# Patient Record
Sex: Female | Born: 1940
Health system: Southern US, Community
[De-identification: ages and names within clinical notes are randomized; demographics above are authoritative.]

## PROBLEM LIST (undated history)

## (undated) ENCOUNTER — Emergency Department (HOSPITAL_BASED_OUTPATIENT_CLINIC_OR_DEPARTMENT_OTHER): Admission: EM | Payer: Medicare HMO | Source: Home / Self Care

## (undated) DIAGNOSIS — G4730 Sleep apnea, unspecified: Secondary | ICD-10-CM

## (undated) DIAGNOSIS — M1990 Unspecified osteoarthritis, unspecified site: Secondary | ICD-10-CM

## (undated) DIAGNOSIS — R7309 Other abnormal glucose: Secondary | ICD-10-CM

## (undated) DIAGNOSIS — M797 Fibromyalgia: Secondary | ICD-10-CM

## (undated) DIAGNOSIS — K648 Other hemorrhoids: Secondary | ICD-10-CM

## (undated) DIAGNOSIS — D126 Benign neoplasm of colon, unspecified: Secondary | ICD-10-CM

## (undated) DIAGNOSIS — M8580 Other specified disorders of bone density and structure, unspecified site: Secondary | ICD-10-CM

## (undated) DIAGNOSIS — F419 Anxiety disorder, unspecified: Secondary | ICD-10-CM

## (undated) DIAGNOSIS — Z8719 Personal history of other diseases of the digestive system: Secondary | ICD-10-CM

## (undated) DIAGNOSIS — Z9289 Personal history of other medical treatment: Secondary | ICD-10-CM

## (undated) DIAGNOSIS — E785 Hyperlipidemia, unspecified: Secondary | ICD-10-CM

## (undated) DIAGNOSIS — G44009 Cluster headache syndrome, unspecified, not intractable: Secondary | ICD-10-CM

## (undated) DIAGNOSIS — F5104 Psychophysiologic insomnia: Secondary | ICD-10-CM

## (undated) DIAGNOSIS — F329 Major depressive disorder, single episode, unspecified: Secondary | ICD-10-CM

## (undated) DIAGNOSIS — I890 Lymphedema, not elsewhere classified: Secondary | ICD-10-CM

## (undated) DIAGNOSIS — J189 Pneumonia, unspecified organism: Secondary | ICD-10-CM

## (undated) DIAGNOSIS — E039 Hypothyroidism, unspecified: Secondary | ICD-10-CM

## (undated) DIAGNOSIS — N393 Stress incontinence (female) (male): Secondary | ICD-10-CM

## (undated) DIAGNOSIS — Z86010 Personal history of colonic polyps: Secondary | ICD-10-CM

## (undated) DIAGNOSIS — E119 Type 2 diabetes mellitus without complications: Secondary | ICD-10-CM

## (undated) DIAGNOSIS — Z923 Personal history of irradiation: Secondary | ICD-10-CM

## (undated) DIAGNOSIS — K589 Irritable bowel syndrome without diarrhea: Secondary | ICD-10-CM

## (undated) DIAGNOSIS — H3530 Unspecified macular degeneration: Secondary | ICD-10-CM

## (undated) DIAGNOSIS — N200 Calculus of kidney: Secondary | ICD-10-CM

## (undated) DIAGNOSIS — K5790 Diverticulosis of intestine, part unspecified, without perforation or abscess without bleeding: Secondary | ICD-10-CM

## (undated) DIAGNOSIS — K219 Gastro-esophageal reflux disease without esophagitis: Secondary | ICD-10-CM

## (undated) DIAGNOSIS — F32A Depression, unspecified: Secondary | ICD-10-CM

## (undated) DIAGNOSIS — R0600 Dyspnea, unspecified: Secondary | ICD-10-CM

## (undated) DIAGNOSIS — M25519 Pain in unspecified shoulder: Secondary | ICD-10-CM

## (undated) DIAGNOSIS — Z87442 Personal history of urinary calculi: Secondary | ICD-10-CM

## (undated) DIAGNOSIS — C50919 Malignant neoplasm of unspecified site of unspecified female breast: Secondary | ICD-10-CM

## (undated) HISTORY — DX: Unspecified macular degeneration: H35.30

## (undated) HISTORY — PX: FOOT ARTHROPLASTY: SHX1657

## (undated) HISTORY — PX: HEMORRHOID SURGERY: SHX153

## (undated) HISTORY — DX: Personal history of colonic polyps: Z86.010

## (undated) HISTORY — PX: TONSILLECTOMY: SHX5217

## (undated) HISTORY — DX: Fibromyalgia: M79.7

## (undated) HISTORY — PX: EYE SURGERY: SHX253

## (undated) HISTORY — PX: TOE SURGERY: SHX1073

## (undated) HISTORY — DX: Benign neoplasm of colon, unspecified: D12.6

## (undated) HISTORY — DX: Diverticulosis of intestine, part unspecified, without perforation or abscess without bleeding: K57.90

## (undated) HISTORY — DX: Cluster headache syndrome, unspecified, not intractable: G44.009

## (undated) HISTORY — DX: Pain in unspecified shoulder: M25.519

## (undated) HISTORY — DX: Stress incontinence (female) (male): N39.3

## (undated) HISTORY — PX: APPENDECTOMY: SHX54

## (undated) HISTORY — DX: Major depressive disorder, single episode, unspecified: F32.9

## (undated) HISTORY — DX: Hypothyroidism, unspecified: E03.9

## (undated) HISTORY — PX: UPPER GASTROINTESTINAL ENDOSCOPY: SHX188

## (undated) HISTORY — PX: OTHER SURGICAL HISTORY: SHX169

## (undated) HISTORY — DX: Psychophysiologic insomnia: F51.04

## (undated) HISTORY — DX: Other hemorrhoids: K64.8

## (undated) HISTORY — DX: Irritable bowel syndrome, unspecified: K58.9

## (undated) HISTORY — PX: GANGLION CYST EXCISION: SHX1691

## (undated) HISTORY — PX: ABDOMINAL HYSTERECTOMY: SHX81

## (undated) HISTORY — DX: Personal history of irradiation: Z92.3

## (undated) HISTORY — PX: KNEE ARTHROSCOPY: SHX127

## (undated) HISTORY — DX: Gastro-esophageal reflux disease without esophagitis: K21.9

## (undated) HISTORY — DX: Other abnormal glucose: R73.09

## (undated) HISTORY — PX: RECTOCELE REPAIR: SHX761

## (undated) HISTORY — PX: COLONOSCOPY: SHX174

## (undated) HISTORY — DX: Hyperlipidemia, unspecified: E78.5

## (undated) HISTORY — PX: CYSTOCELE REPAIR: SHX163

## (undated) HISTORY — PX: BREAST CYST ASPIRATION: SHX578

## (undated) HISTORY — PX: CHOLECYSTECTOMY: SHX55

## (undated) HISTORY — DX: Malignant neoplasm of unspecified site of unspecified female breast: C50.919

## (undated) HISTORY — PX: CATARACT EXTRACTION, BILATERAL: SHX1313

---

## 2006-06-22 ENCOUNTER — Encounter: Payer: Self-pay | Admitting: Internal Medicine

## 2006-07-21 ENCOUNTER — Encounter: Payer: Self-pay | Admitting: Internal Medicine

## 2006-08-25 ENCOUNTER — Encounter: Payer: Self-pay | Admitting: Internal Medicine

## 2006-10-05 ENCOUNTER — Encounter: Payer: Self-pay | Admitting: Internal Medicine

## 2007-01-07 ENCOUNTER — Emergency Department (HOSPITAL_COMMUNITY): Admission: EM | Admit: 2007-01-07 | Discharge: 2007-01-07 | Payer: Self-pay | Admitting: *Deleted

## 2007-01-10 ENCOUNTER — Encounter: Payer: Self-pay | Admitting: Internal Medicine

## 2007-03-21 ENCOUNTER — Encounter: Payer: Self-pay | Admitting: Internal Medicine

## 2007-05-24 ENCOUNTER — Encounter: Payer: Self-pay | Admitting: Internal Medicine

## 2007-05-25 ENCOUNTER — Encounter: Payer: Self-pay | Admitting: Internal Medicine

## 2007-07-05 ENCOUNTER — Encounter: Payer: Self-pay | Admitting: Internal Medicine

## 2007-09-13 ENCOUNTER — Encounter: Payer: Self-pay | Admitting: Internal Medicine

## 2007-09-27 ENCOUNTER — Encounter: Payer: Self-pay | Admitting: Internal Medicine

## 2007-11-16 HISTORY — PX: TOE SURGERY: SHX1073

## 2008-01-17 ENCOUNTER — Encounter: Payer: Self-pay | Admitting: Internal Medicine

## 2008-02-01 ENCOUNTER — Encounter: Payer: Self-pay | Admitting: Internal Medicine

## 2008-03-15 ENCOUNTER — Encounter: Payer: Self-pay | Admitting: Internal Medicine

## 2008-03-18 ENCOUNTER — Ambulatory Visit: Payer: Self-pay | Admitting: Internal Medicine

## 2008-03-18 DIAGNOSIS — IMO0001 Reserved for inherently not codable concepts without codable children: Secondary | ICD-10-CM | POA: Insufficient documentation

## 2008-03-18 DIAGNOSIS — E785 Hyperlipidemia, unspecified: Secondary | ICD-10-CM | POA: Insufficient documentation

## 2008-03-18 DIAGNOSIS — R7303 Prediabetes: Secondary | ICD-10-CM | POA: Insufficient documentation

## 2008-03-18 DIAGNOSIS — G4700 Insomnia, unspecified: Secondary | ICD-10-CM | POA: Insufficient documentation

## 2008-03-18 DIAGNOSIS — E039 Hypothyroidism, unspecified: Secondary | ICD-10-CM | POA: Insufficient documentation

## 2008-03-18 LAB — CONVERTED CEMR LAB
Albumin: 3.8 g/dL (ref 3.5–5.2)
Basophils Relative: 0.2 % (ref 0.0–1.0)
Bilirubin, Direct: 0.1 mg/dL (ref 0.0–0.3)
Calcium: 9.1 mg/dL (ref 8.4–10.5)
Chloride: 102 meq/L (ref 96–112)
Cholesterol: 231 mg/dL (ref 0–200)
Direct LDL: 176.1 mg/dL
Eosinophils Relative: 2.2 % (ref 0.0–5.0)
GFR calc non Af Amer: 90 mL/min
Glucose, Bld: 114 mg/dL — ABNORMAL HIGH (ref 70–99)
HCT: 40.8 % (ref 36.0–46.0)
HDL: 34.7 mg/dL — ABNORMAL LOW (ref >39.0)
Hgb A1c MFr Bld: 6.1 % — ABNORMAL HIGH (ref 4.6–6.0)
Lymphocytes Relative: 26.8 % (ref 12.0–46.0)
Monocytes Absolute: 0.5 10*3/uL (ref 0.1–1.0)
Neutro Abs: 6.3 10*3/uL (ref 1.4–7.7)
Potassium: 4.6 meq/L (ref 3.5–5.1)
RBC: 4.67 M/uL (ref 3.87–5.11)
RDW: 13.4 % (ref 11.5–14.6)
Sodium: 145 meq/L (ref 135–145)
Total CHOL/HDL Ratio: 6.7
Triglycerides: 122 mg/dL (ref 0–149)
VLDL: 24 mg/dL (ref 0–40)
WBC: 9.5 10*3/uL (ref 4.5–10.5)

## 2008-03-19 DIAGNOSIS — Z860100 Personal history of colon polyps, unspecified: Secondary | ICD-10-CM | POA: Insufficient documentation

## 2008-03-19 DIAGNOSIS — Z87442 Personal history of urinary calculi: Secondary | ICD-10-CM | POA: Insufficient documentation

## 2008-03-19 DIAGNOSIS — J4520 Mild intermittent asthma, uncomplicated: Secondary | ICD-10-CM | POA: Insufficient documentation

## 2008-03-19 DIAGNOSIS — K219 Gastro-esophageal reflux disease without esophagitis: Secondary | ICD-10-CM | POA: Insufficient documentation

## 2008-04-15 ENCOUNTER — Ambulatory Visit: Payer: Self-pay | Admitting: Internal Medicine

## 2008-04-15 DIAGNOSIS — K589 Irritable bowel syndrome without diarrhea: Secondary | ICD-10-CM | POA: Insufficient documentation

## 2008-04-22 ENCOUNTER — Telehealth: Payer: Self-pay | Admitting: Internal Medicine

## 2008-04-22 DIAGNOSIS — M79609 Pain in unspecified limb: Secondary | ICD-10-CM | POA: Insufficient documentation

## 2008-05-01 ENCOUNTER — Ambulatory Visit: Payer: Self-pay | Admitting: Internal Medicine

## 2008-05-01 ENCOUNTER — Telehealth: Payer: Self-pay | Admitting: Internal Medicine

## 2008-05-05 ENCOUNTER — Telehealth: Payer: Self-pay | Admitting: Internal Medicine

## 2008-05-16 ENCOUNTER — Telehealth (INDEPENDENT_AMBULATORY_CARE_PROVIDER_SITE_OTHER): Payer: Self-pay | Admitting: *Deleted

## 2008-05-20 ENCOUNTER — Ambulatory Visit: Payer: Self-pay | Admitting: Internal Medicine

## 2008-05-20 DIAGNOSIS — H21309 Idiopathic cysts of iris, ciliary body or anterior chamber, unspecified eye: Secondary | ICD-10-CM | POA: Insufficient documentation

## 2008-05-22 ENCOUNTER — Encounter: Payer: Self-pay | Admitting: Internal Medicine

## 2008-05-22 ENCOUNTER — Encounter: Admission: RE | Admit: 2008-05-22 | Discharge: 2008-05-22 | Payer: Self-pay | Admitting: Internal Medicine

## 2008-06-06 ENCOUNTER — Ambulatory Visit: Payer: Self-pay | Admitting: Internal Medicine

## 2008-06-06 DIAGNOSIS — L259 Unspecified contact dermatitis, unspecified cause: Secondary | ICD-10-CM | POA: Insufficient documentation

## 2008-06-13 ENCOUNTER — Ambulatory Visit: Payer: Self-pay | Admitting: Internal Medicine

## 2008-06-13 LAB — CONVERTED CEMR LAB
ALT: 31 units/L (ref 0–35)
AST: 22 units/L (ref 0–37)
LDL Cholesterol: 86 mg/dL (ref 0–99)
Tissue Transglutaminase Ab, IgA: 0.1 units (ref <7)
Total CHOL/HDL Ratio: 5.2

## 2008-06-18 ENCOUNTER — Ambulatory Visit: Payer: Self-pay | Admitting: Internal Medicine

## 2008-06-18 DIAGNOSIS — L0233 Carbuncle of buttock: Secondary | ICD-10-CM | POA: Insufficient documentation

## 2008-06-18 DIAGNOSIS — Z8711 Personal history of peptic ulcer disease: Secondary | ICD-10-CM | POA: Insufficient documentation

## 2008-06-19 ENCOUNTER — Encounter: Payer: Self-pay | Admitting: Internal Medicine

## 2008-06-24 ENCOUNTER — Telehealth: Payer: Self-pay | Admitting: Internal Medicine

## 2008-07-30 ENCOUNTER — Telehealth: Payer: Self-pay | Admitting: Internal Medicine

## 2008-08-08 ENCOUNTER — Ambulatory Visit: Payer: Self-pay | Admitting: Internal Medicine

## 2008-08-08 ENCOUNTER — Ambulatory Visit (HOSPITAL_BASED_OUTPATIENT_CLINIC_OR_DEPARTMENT_OTHER): Admission: RE | Admit: 2008-08-08 | Discharge: 2008-08-08 | Payer: Self-pay | Admitting: Internal Medicine

## 2008-08-08 DIAGNOSIS — R109 Unspecified abdominal pain: Secondary | ICD-10-CM | POA: Insufficient documentation

## 2008-08-08 LAB — CONVERTED CEMR LAB
BUN: 11 mg/dL (ref 6–23)
CA 125: 8.9 units/mL (ref 0.0–30.2)
CO2: 24 meq/L (ref 19–32)
Chloride: 110 meq/L (ref 96–112)
GFR calc non Af Amer: 77 mL/min
Hgb A1c MFr Bld: 6.1 % — ABNORMAL HIGH (ref 4.6–6.0)
Potassium: 4.2 meq/L (ref 3.5–5.1)
Sodium: 143 meq/L (ref 135–145)

## 2008-08-13 ENCOUNTER — Ambulatory Visit: Payer: Self-pay | Admitting: Internal Medicine

## 2008-08-13 ENCOUNTER — Ambulatory Visit (HOSPITAL_BASED_OUTPATIENT_CLINIC_OR_DEPARTMENT_OTHER): Admission: RE | Admit: 2008-08-13 | Discharge: 2008-08-13 | Payer: Self-pay | Admitting: Internal Medicine

## 2008-08-13 DIAGNOSIS — J159 Unspecified bacterial pneumonia: Secondary | ICD-10-CM | POA: Insufficient documentation

## 2008-08-16 ENCOUNTER — Ambulatory Visit: Payer: Self-pay | Admitting: Internal Medicine

## 2008-10-03 ENCOUNTER — Telehealth: Payer: Self-pay | Admitting: Internal Medicine

## 2008-10-04 ENCOUNTER — Ambulatory Visit: Payer: Self-pay | Admitting: Internal Medicine

## 2008-10-04 DIAGNOSIS — R05 Cough: Secondary | ICD-10-CM

## 2008-10-04 DIAGNOSIS — L909 Atrophic disorder of skin, unspecified: Secondary | ICD-10-CM | POA: Insufficient documentation

## 2008-10-04 DIAGNOSIS — R059 Cough, unspecified: Secondary | ICD-10-CM | POA: Insufficient documentation

## 2008-10-04 DIAGNOSIS — L919 Hypertrophic disorder of the skin, unspecified: Secondary | ICD-10-CM

## 2008-10-16 ENCOUNTER — Encounter: Payer: Self-pay | Admitting: Internal Medicine

## 2008-10-16 ENCOUNTER — Ambulatory Visit: Payer: Self-pay | Admitting: Internal Medicine

## 2008-10-21 ENCOUNTER — Encounter: Payer: Self-pay | Admitting: Internal Medicine

## 2008-10-21 ENCOUNTER — Ambulatory Visit: Payer: Self-pay | Admitting: Internal Medicine

## 2008-10-21 DIAGNOSIS — I781 Nevus, non-neoplastic: Secondary | ICD-10-CM | POA: Insufficient documentation

## 2008-10-21 DIAGNOSIS — J029 Acute pharyngitis, unspecified: Secondary | ICD-10-CM | POA: Insufficient documentation

## 2008-11-19 ENCOUNTER — Ambulatory Visit: Payer: Self-pay | Admitting: Internal Medicine

## 2008-11-19 LAB — CONVERTED CEMR LAB
ALT: 28 units/L (ref 0–35)
BUN: 16 mg/dL (ref 6–23)
GFR calc Af Amer: 108 mL/min
Sodium: 140 meq/L (ref 135–145)
TSH: 2.16 microintl units/mL (ref 0.35–5.50)

## 2008-11-21 ENCOUNTER — Telehealth (INDEPENDENT_AMBULATORY_CARE_PROVIDER_SITE_OTHER): Payer: Self-pay | Admitting: *Deleted

## 2008-11-21 ENCOUNTER — Ambulatory Visit (HOSPITAL_BASED_OUTPATIENT_CLINIC_OR_DEPARTMENT_OTHER): Admission: RE | Admit: 2008-11-21 | Discharge: 2008-11-21 | Payer: Self-pay | Admitting: Internal Medicine

## 2008-11-21 ENCOUNTER — Ambulatory Visit: Payer: Self-pay | Admitting: Interventional Radiology

## 2008-11-21 ENCOUNTER — Ambulatory Visit: Payer: Self-pay | Admitting: Internal Medicine

## 2008-11-21 DIAGNOSIS — M25519 Pain in unspecified shoulder: Secondary | ICD-10-CM | POA: Insufficient documentation

## 2008-11-21 DIAGNOSIS — H1030 Unspecified acute conjunctivitis, unspecified eye: Secondary | ICD-10-CM | POA: Insufficient documentation

## 2008-11-22 ENCOUNTER — Telehealth (INDEPENDENT_AMBULATORY_CARE_PROVIDER_SITE_OTHER): Payer: Self-pay | Admitting: *Deleted

## 2008-11-25 ENCOUNTER — Encounter: Payer: Self-pay | Admitting: Internal Medicine

## 2008-11-25 ENCOUNTER — Telehealth: Payer: Self-pay | Admitting: Internal Medicine

## 2008-11-27 ENCOUNTER — Encounter: Payer: Self-pay | Admitting: Internal Medicine

## 2009-01-08 ENCOUNTER — Telehealth: Payer: Self-pay | Admitting: Internal Medicine

## 2009-01-16 ENCOUNTER — Ambulatory Visit: Payer: Self-pay | Admitting: Internal Medicine

## 2009-01-16 DIAGNOSIS — L538 Other specified erythematous conditions: Secondary | ICD-10-CM | POA: Insufficient documentation

## 2009-01-21 ENCOUNTER — Encounter: Payer: Self-pay | Admitting: Emergency Medicine

## 2009-01-21 ENCOUNTER — Ambulatory Visit: Payer: Self-pay | Admitting: Internal Medicine

## 2009-01-21 ENCOUNTER — Ambulatory Visit: Payer: Self-pay | Admitting: Diagnostic Radiology

## 2009-01-21 ENCOUNTER — Observation Stay (HOSPITAL_COMMUNITY): Admission: EM | Admit: 2009-01-21 | Discharge: 2009-01-22 | Payer: Self-pay | Admitting: Internal Medicine

## 2009-01-23 ENCOUNTER — Telehealth: Payer: Self-pay | Admitting: Internal Medicine

## 2009-01-27 ENCOUNTER — Ambulatory Visit: Payer: Self-pay | Admitting: Internal Medicine

## 2009-01-27 DIAGNOSIS — R0789 Other chest pain: Secondary | ICD-10-CM | POA: Insufficient documentation

## 2009-02-06 ENCOUNTER — Ambulatory Visit: Payer: Self-pay | Admitting: Internal Medicine

## 2009-02-06 ENCOUNTER — Ambulatory Visit (HOSPITAL_BASED_OUTPATIENT_CLINIC_OR_DEPARTMENT_OTHER): Admission: RE | Admit: 2009-02-06 | Discharge: 2009-02-06 | Payer: Self-pay | Admitting: Internal Medicine

## 2009-02-06 ENCOUNTER — Telehealth: Payer: Self-pay | Admitting: Internal Medicine

## 2009-02-06 ENCOUNTER — Telehealth: Payer: Self-pay | Admitting: Family Medicine

## 2009-02-06 ENCOUNTER — Ambulatory Visit: Payer: Self-pay | Admitting: Diagnostic Radiology

## 2009-02-06 DIAGNOSIS — M25539 Pain in unspecified wrist: Secondary | ICD-10-CM | POA: Insufficient documentation

## 2009-02-19 ENCOUNTER — Ambulatory Visit: Payer: Self-pay | Admitting: Cardiology

## 2009-02-19 ENCOUNTER — Encounter: Payer: Self-pay | Admitting: Cardiology

## 2009-02-19 DIAGNOSIS — R0602 Shortness of breath: Secondary | ICD-10-CM | POA: Insufficient documentation

## 2009-02-25 ENCOUNTER — Telehealth (INDEPENDENT_AMBULATORY_CARE_PROVIDER_SITE_OTHER): Payer: Self-pay

## 2009-02-26 ENCOUNTER — Encounter: Payer: Self-pay | Admitting: Cardiology

## 2009-02-26 ENCOUNTER — Ambulatory Visit: Payer: Self-pay

## 2009-04-03 ENCOUNTER — Ambulatory Visit: Payer: Self-pay | Admitting: Internal Medicine

## 2009-04-03 DIAGNOSIS — I10 Essential (primary) hypertension: Secondary | ICD-10-CM | POA: Insufficient documentation

## 2009-04-03 LAB — CONVERTED CEMR LAB
GFR calc non Af Amer: 88.99 mL/min (ref >60)
Hgb A1c MFr Bld: 5.9 % (ref 4.6–6.5)
Potassium: 4 meq/L (ref 3.5–5.1)
Sodium: 140 meq/L (ref 135–145)

## 2009-04-07 ENCOUNTER — Ambulatory Visit: Payer: Self-pay | Admitting: Internal Medicine

## 2009-04-22 ENCOUNTER — Ambulatory Visit: Payer: Self-pay | Admitting: Internal Medicine

## 2009-04-22 DIAGNOSIS — J018 Other acute sinusitis: Secondary | ICD-10-CM | POA: Insufficient documentation

## 2009-04-23 ENCOUNTER — Telehealth: Payer: Self-pay | Admitting: Internal Medicine

## 2009-06-16 ENCOUNTER — Ambulatory Visit: Payer: Self-pay | Admitting: Diagnostic Radiology

## 2009-06-16 ENCOUNTER — Ambulatory Visit (HOSPITAL_BASED_OUTPATIENT_CLINIC_OR_DEPARTMENT_OTHER): Admission: RE | Admit: 2009-06-16 | Discharge: 2009-06-16 | Payer: Self-pay | Admitting: Internal Medicine

## 2009-06-16 ENCOUNTER — Telehealth: Payer: Self-pay | Admitting: Internal Medicine

## 2009-06-16 DIAGNOSIS — L680 Hirsutism: Secondary | ICD-10-CM | POA: Insufficient documentation

## 2009-07-22 ENCOUNTER — Ambulatory Visit: Payer: Self-pay | Admitting: Internal Medicine

## 2009-07-22 LAB — CONVERTED CEMR LAB
AST: 15 units/L (ref 0–37)
Alkaline Phosphatase: 65 units/L (ref 39–117)
BUN: 23 mg/dL (ref 6–23)
Basophils Absolute: 0 10*3/uL (ref 0.0–0.1)
Basophils Relative: 0 % (ref 0–1)
Bilirubin, Direct: 0.1 mg/dL (ref 0.0–0.3)
Creatinine, Ser: 0.65 mg/dL (ref 0.40–1.20)
Eosinophils Absolute: 0.3 10*3/uL (ref 0.0–0.7)
Eosinophils Relative: 3 % (ref 0–5)
Hemoglobin: 14 g/dL (ref 12.0–15.0)
Indirect Bilirubin: 0.4 mg/dL (ref 0.0–0.9)
Lymphs Abs: 2.6 10*3/uL (ref 0.7–4.0)
MCHC: 32.4 g/dL (ref 30.0–36.0)
Monocytes Absolute: 0.5 10*3/uL (ref 0.1–1.0)
Monocytes Relative: 6 % (ref 3–12)
Neutro Abs: 5.7 10*3/uL (ref 1.7–7.7)
Platelets: 252 10*3/uL (ref 150–400)
Prolactin: 3.9 ng/mL
Sodium: 139 meq/L (ref 135–145)
Testosterone: 78.04 ng/dL — ABNORMAL HIGH (ref 10–70)
Total CHOL/HDL Ratio: 4
VLDL: 23 mg/dL (ref 0–40)
WBC: 9.1 10*3/uL (ref 4.0–10.5)

## 2009-07-28 ENCOUNTER — Ambulatory Visit: Payer: Self-pay | Admitting: Internal Medicine

## 2009-07-31 ENCOUNTER — Encounter: Payer: Self-pay | Admitting: Internal Medicine

## 2009-08-15 ENCOUNTER — Ambulatory Visit: Payer: Self-pay | Admitting: Diagnostic Radiology

## 2009-08-15 ENCOUNTER — Emergency Department (HOSPITAL_BASED_OUTPATIENT_CLINIC_OR_DEPARTMENT_OTHER): Admission: EM | Admit: 2009-08-15 | Discharge: 2009-08-15 | Payer: Self-pay | Admitting: Emergency Medicine

## 2009-08-18 ENCOUNTER — Ambulatory Visit: Payer: Self-pay | Admitting: Internal Medicine

## 2009-09-26 ENCOUNTER — Telehealth: Payer: Self-pay | Admitting: Internal Medicine

## 2009-10-01 ENCOUNTER — Telehealth: Payer: Self-pay | Admitting: Internal Medicine

## 2009-10-13 ENCOUNTER — Ambulatory Visit: Payer: Self-pay | Admitting: Hematology & Oncology

## 2009-10-14 ENCOUNTER — Telehealth: Payer: Self-pay | Admitting: Internal Medicine

## 2009-11-06 ENCOUNTER — Encounter: Payer: Self-pay | Admitting: Internal Medicine

## 2009-11-15 DIAGNOSIS — K760 Fatty (change of) liver, not elsewhere classified: Secondary | ICD-10-CM

## 2009-11-15 HISTORY — DX: Fatty (change of) liver, not elsewhere classified: K76.0

## 2009-12-01 ENCOUNTER — Encounter (INDEPENDENT_AMBULATORY_CARE_PROVIDER_SITE_OTHER): Payer: Self-pay | Admitting: *Deleted

## 2009-12-03 ENCOUNTER — Ambulatory Visit: Payer: Self-pay | Admitting: Gastroenterology

## 2009-12-03 ENCOUNTER — Telehealth: Payer: Self-pay | Admitting: Gastroenterology

## 2009-12-03 ENCOUNTER — Encounter (INDEPENDENT_AMBULATORY_CARE_PROVIDER_SITE_OTHER): Payer: Self-pay | Admitting: *Deleted

## 2009-12-16 ENCOUNTER — Ambulatory Visit: Payer: Self-pay | Admitting: Gastroenterology

## 2009-12-17 ENCOUNTER — Encounter: Payer: Self-pay | Admitting: Gastroenterology

## 2009-12-18 ENCOUNTER — Encounter: Payer: Self-pay | Admitting: Gastroenterology

## 2010-02-12 ENCOUNTER — Telehealth: Payer: Self-pay | Admitting: Internal Medicine

## 2010-02-16 ENCOUNTER — Encounter: Payer: Self-pay | Admitting: Internal Medicine

## 2010-02-16 LAB — CONVERTED CEMR LAB
BUN: 17 mg/dL (ref 6–23)
Bilirubin, Direct: 0.1 mg/dL (ref 0.0–0.3)
Chloride: 103 meq/L (ref 96–112)
Glucose, Bld: 91 mg/dL (ref 70–99)
HDL: 38 mg/dL — ABNORMAL LOW (ref >39)
Indirect Bilirubin: 0.7 mg/dL (ref 0.0–0.9)
LDL Cholesterol: 100 mg/dL — ABNORMAL HIGH (ref 0–99)
Potassium: 4.3 meq/L (ref 3.5–5.3)
Sodium: 140 meq/L (ref 135–145)
TSH: 1.789 microintl units/mL (ref 0.350–4.500)
Total Bilirubin: 0.8 mg/dL (ref 0.3–1.2)
Total CHOL/HDL Ratio: 4.5
Total Protein: 6.5 g/dL (ref 6.0–8.3)
VLDL: 32 mg/dL (ref 0–40)

## 2010-02-17 ENCOUNTER — Encounter: Payer: Self-pay | Admitting: Internal Medicine

## 2010-02-17 LAB — CONVERTED CEMR LAB: Hgb A1c MFr Bld: 6 % (ref 4.6–6.1)

## 2010-02-26 ENCOUNTER — Ambulatory Visit: Payer: Self-pay | Admitting: Internal Medicine

## 2010-02-27 ENCOUNTER — Telehealth: Payer: Self-pay | Admitting: Internal Medicine

## 2010-03-11 ENCOUNTER — Telehealth: Payer: Self-pay | Admitting: Internal Medicine

## 2010-03-17 ENCOUNTER — Encounter: Payer: Self-pay | Admitting: Internal Medicine

## 2010-03-20 ENCOUNTER — Encounter: Payer: Self-pay | Admitting: Internal Medicine

## 2010-03-20 ENCOUNTER — Encounter: Admission: RE | Admit: 2010-03-20 | Discharge: 2010-03-20 | Payer: Self-pay | Admitting: Surgery

## 2010-03-23 ENCOUNTER — Telehealth: Payer: Self-pay | Admitting: Internal Medicine

## 2010-06-03 ENCOUNTER — Encounter: Payer: Self-pay | Admitting: Internal Medicine

## 2010-06-17 ENCOUNTER — Ambulatory Visit: Payer: Self-pay | Admitting: Diagnostic Radiology

## 2010-06-17 ENCOUNTER — Ambulatory Visit (HOSPITAL_BASED_OUTPATIENT_CLINIC_OR_DEPARTMENT_OTHER): Admission: RE | Admit: 2010-06-17 | Discharge: 2010-06-17 | Payer: Self-pay | Admitting: Internal Medicine

## 2010-06-29 ENCOUNTER — Telehealth: Payer: Self-pay | Admitting: Internal Medicine

## 2010-08-17 ENCOUNTER — Telehealth: Payer: Self-pay | Admitting: Internal Medicine

## 2010-08-17 ENCOUNTER — Encounter: Payer: Self-pay | Admitting: Internal Medicine

## 2010-08-17 DIAGNOSIS — H9190 Unspecified hearing loss, unspecified ear: Secondary | ICD-10-CM | POA: Insufficient documentation

## 2010-08-17 LAB — CONVERTED CEMR LAB
Albumin: 4.5 g/dL (ref 3.5–5.2)
Alkaline Phosphatase: 66 units/L (ref 39–117)
BUN: 18 mg/dL (ref 6–23)
Bilirubin, Direct: 0.1 mg/dL (ref 0.0–0.3)
Chloride: 106 meq/L (ref 96–112)
Cholesterol: 156 mg/dL (ref 0–200)
Creatinine, Ser: 0.71 mg/dL (ref 0.40–1.20)
Indirect Bilirubin: 0.4 mg/dL (ref 0.0–0.9)
LDL Cholesterol: 88 mg/dL (ref 0–99)
Total CHOL/HDL Ratio: 4.3
Total Protein: 6.1 g/dL (ref 6.0–8.3)

## 2010-08-18 ENCOUNTER — Encounter: Payer: Self-pay | Admitting: Internal Medicine

## 2010-08-18 ENCOUNTER — Ambulatory Visit: Payer: Self-pay | Admitting: Internal Medicine

## 2010-08-25 ENCOUNTER — Ambulatory Visit: Payer: Self-pay | Admitting: Internal Medicine

## 2010-08-31 ENCOUNTER — Telehealth: Payer: Self-pay | Admitting: Internal Medicine

## 2010-08-31 DIAGNOSIS — M25569 Pain in unspecified knee: Secondary | ICD-10-CM | POA: Insufficient documentation

## 2010-09-01 ENCOUNTER — Telehealth: Payer: Self-pay | Admitting: Internal Medicine

## 2010-09-02 ENCOUNTER — Encounter: Payer: Self-pay | Admitting: Internal Medicine

## 2010-09-02 ENCOUNTER — Telehealth: Payer: Self-pay | Admitting: Internal Medicine

## 2010-09-21 ENCOUNTER — Encounter: Payer: Self-pay | Admitting: Internal Medicine

## 2010-10-19 ENCOUNTER — Telehealth: Payer: Self-pay | Admitting: Internal Medicine

## 2010-11-17 ENCOUNTER — Telehealth: Payer: Self-pay | Admitting: Internal Medicine

## 2010-11-18 ENCOUNTER — Telehealth: Payer: Self-pay | Admitting: Internal Medicine

## 2010-12-10 ENCOUNTER — Telehealth: Payer: Self-pay | Admitting: Internal Medicine

## 2010-12-15 ENCOUNTER — Encounter: Payer: Self-pay | Admitting: Internal Medicine

## 2010-12-15 LAB — CONVERTED CEMR LAB
CO2: 26 meq/L (ref 19–32)
Chloride: 100 meq/L (ref 96–112)
Creatinine, Ser: 0.62 mg/dL (ref 0.40–1.20)
Potassium: 4.4 meq/L (ref 3.5–5.3)

## 2010-12-15 NOTE — Miscellaneous (Signed)
Summary: [facility]   Imported By: Lanelle Bal 06/12/2010 13:22:37  _____________________________________________________________________  External Attachment:    Type:   Image     Comment:   External Document

## 2010-12-15 NOTE — Medication Information (Signed)
Summary: Denial for Lamisil/[facility]  Denial for Lamisil/[facility]   Imported By: Lanelle Bal 03/25/2010 09:26:05  _____________________________________________________________________  External Attachment:    Type:   Image     Comment:   External Document

## 2010-12-15 NOTE — Letter (Signed)
Summary: Patient Notice- Polyp Results  [facility]  [address] [place], [place] [address]   Phone: [phone]  Fax: [phone]        December 17, 2009 MRN: [id number]    Jean Nash [address] [place], [place]  [address]    Dear Ms. Loudon,  I am pleased to inform you that the colon polyp(s) removed during your recent colonoscopy was (were) found to be benign (no cancer detected) upon pathologic examination.  I recommend you have a repeat colonoscopy examination in 2 years to look for recurrent polyps, as having colon polyps increases your risk for having recurrent polyps or even colon cancer in the future.  Should you develop new or worsening symptoms of abdominal pain, bowel habit changes or bleeding from the rectum or bowels, please schedule an evaluation with either your primary care physician or with me.  Continue treatment plan as outlined the day of your exam.  Please call us if you are having persistent problems or have questions about your condition that have not been fully answered at this time.  Sincerely,  Meryl Dare MD [facility]  This letter has been electronically signed by your physician.  Appended Document: Patient Notice- Polyp Results letter mailed 2.4.11

## 2010-12-15 NOTE — Progress Notes (Signed)
Summary: Pt would like MRI today if possible  Phone Note Call from Patient   Caller: Spouse Reason for Call: Talk to Nurse Summary of Call: pt got an ortho appt tomorrow morning, leg is more swollen  this morning, pt would like to get an MRI today, ortho doctor's office is requesting Dr Artist Pais to order MRI today for tomorrow's appt per pt's husband Initial call taken by: Lannette Donath,  September 01, 2010 9:31 AM  Follow-up for Phone Call        call placed to Dr Darol Destine office at [phone], for clarification of MRI request. They advised patient should be seen prior to having MRI done.  Follow-up by: Glendell Docker CMA,  September 01, 2010 11:53 AM  Additional Follow-up for Phone Call Additional follow up Details #1::        call returned to patient at [phone], no answer. A detailed voice message was left informing patient Dr Artist Pais advises  seeing Ortho before the MRI is done.  Message was left for patient to call if any questions Additional Follow-up by: Glendell Docker CMA,  September 02, 2010 8:12 AM

## 2010-12-15 NOTE — Letter (Signed)
Summary: [facility]  [facility]   Imported By: Maryln Gottron 10/13/2010 13:42:52  _____________________________________________________________________  External Attachment:    Type:   Image     Comment:   External Document

## 2010-12-15 NOTE — Progress Notes (Signed)
Summary: Medication Refills  Phone Note Refill Request Call back at Home Phone [phone] Message from:  Patient on June 29, 2010 1:54 PM  Refills Requested: Medication #1:  LYRICA 75 MG  CAPS Take 1 tab by mouth at bedtime   Dosage confirmed as above?Dosage Confirmed   Brand Name Necessary? No   Supply Requested: 3 months  Medication #2:  SIMVASTATIN 20 MG TABS Take 1 tab by mouth at bedtime   Dosage confirmed as above?Dosage Confirmed   Brand Name Necessary? No   Supply Requested: 3 months patients husband [name] called and left voice message requesting above refills to [facility].   Method Requested: Electronic Next Appointment Scheduled: 08/25/2010 @ 10am Initial call taken by: Glendell Docker CMA,  June 29, 2010 1:55 PM  Follow-up for Phone Call        ok to refill x 5 Follow-up by: D. Thomos Lemons DO,  June 29, 2010 3:53 PM  Additional Follow-up for Phone Call Additional follow up Details #1::        Rx faxed to pharmacy Additional Follow-up by: Glendell Docker CMA,  June 29, 2010 5:12 PM    Prescriptions: LYRICA 75 MG  CAPS (PREGABALIN) Take 1 tab by mouth at bedtime  #90 x 2   Entered by:   Glendell Docker CMA   Authorized by:   D. Thomos Lemons DO   Signed by:   Glendell Docker CMA on 06/29/2010   Method used:   Printed then faxed to ...       [facility]* (mail-order)       [address]       [place], [place]  [address]       Ph: [phone]       Fax: [phone]   RxID:   [id number] SIMVASTATIN 20 MG TABS (SIMVASTATIN) Take 1 tab by mouth at bedtime  #90 x 2   Entered by:   Glendell Docker CMA   Authorized by:   D. Thomos Lemons DO   Signed by:   Glendell Docker CMA on 06/29/2010   Method used:   Electronically to        [facility]* (mail-order)       [address]       [place], [place]  [address]       Ph: [phone]       Fax: [phone]   RxID:   [id number]

## 2010-12-15 NOTE — Consult Note (Signed)
Summary: [facility]  [facility]   Imported By: Lanelle Bal 09/21/2010 12:43:59  _____________________________________________________________________  External Attachment:    Type:   Image     Comment:   External Document

## 2010-12-15 NOTE — Progress Notes (Signed)
Summary: ?need for EGD  ---- Converted from flag ---- ---- 12/03/2009 12:17 PM, Meryl Dare MD [facility] wrote: Her pathology report showed the "gastric polyp" to be reactive gastropathy. It had no precancerous tissue and does not need follow up. No need for EGD at this time.  Keep the colonoscopy as scheduled for V12.72.    MS ------------------------------

## 2010-12-15 NOTE — Assessment & Plan Note (Signed)
Summary: 6 MONTH FOLLOW UP/[facility]   Vital Signs:  Patient profile:   70 year old female Height:      60 inches Weight:      195.50 pounds BMI:     38.32 O2 Sat:      97 % on Room air Temp:     97.2 degrees F oral Pulse rate:   61 / minute Pulse rhythm:   regular Resp:     16 per minute BP sitting:   110 / 72  (right arm) Cuff size:   large  Vitals Entered By: Glendell Docker CMA (February 26, 2010 10:58 AM)  O2 Flow:  Room air CC: Rm 3- 6 Month follow up disease management Comments skin tags, left hand pull, right eyebrow pain to touch- may be related to sinuses, evaluation of dry patches on elbow and scalp   Primary Care Provider:  DThomos Lemons DO  CC:  Rm 3- 6 Month follow up disease management.  History of Present Illness: 70 y/o white female c/o intermittent left groin pain occ sharp worse with getting in and out of car no obvious inguinal protrusion denies hip pain  she c/o scalp fungal dermatitis came back itchy  she also reports mild scaly, silvery patch elbows  Allergies (verified): No Known Drug Allergies  Past History:  Past Medical History: Current Problems:  CHEST PAIN, ATYPICAL (ICD-786.59) HYPERLIPIDEMIA (ICD-272.4)  NEPHROLITHIASIS, HX OF (ICD-V13.01)  INTERTRIGO, CANDIDAL (ICD-695.89) UNSPECIFIED ACUTE CONJUNCTIVITIS (ICD-372.00)  SHOULDER PAIN, LEFT (ICD-719.41)  DYSPLASTIC NEVUS, BACK (ICD-448.1) SKIN TAG (ICD-701.9) BACTERIAL PNEUMONIA (ICD-482.9) PELVIC PAIN, RIGHT (ICD-789.09) CARBUNCLE AND FURUNCLE OF BUTTOCK (ICD-680.5) HELICOBACTER PYLORI INFECTION, HX OF (ICD-V12.71) DERMATITIS (ICD-692.9) CYST, IDIOPATHIC (ICD-364.60) IRRITABLE BOWEL SYNDROME (ICD-564.1) INSOMNIA, CHRONIC (ICD-307.42) GERD (ICD-530.81) COLONIC POLYPS, HX OF (ICD-V12.72) ASTHMA (ICD-493.90) FIBROMYALGIA (ICD-729.1) HYPOTHYROIDISM (ICD-244.9)  DIABETES MELLITUS, TYPE II, BORDERLINE (ICD-790.29) History of migraines/cluster headaches Stress incontinence Macular  degeneration History of H. Pylori gastritis   Hx of pneumonia   Past Surgical History: Appendectomy Cholecystectomy  Hysterectomy  Tonsillectomy  Eye surgery to repair macular hole   Cataract extraction        Family History: Mother died age 24 - stroke and diabetes Family history of breast cancer Father living age 88  No premature CAD      Social History: Occupation:  Retired Catering manager  Married with 3 grown children    Never Smoked   Alcohol use-no       Physical Exam  General:  alert, well-developed, and well-nourished.   Lungs:  normal respiratory effort and normal breath sounds.   Heart:  normal rate, regular rhythm, and no gallop.   Abdomen:  obese, soft,  mild lower abd tenderness, no inguinal hernia.  left ing tenderness Msk:  no left pain with flexion or ext,  no hip pain with hip rotation Extremities:  No lower extremity edema Skin:  mild plaques on extensor surfaces dry, scaly, slight red area posterior scalp   Impression & Recommendations:  Problem # 1:  INGUINAL PAIN, LEFT (ICD-789.09) 70 y/o c/o intermittent left inguinal pain.  question femoral hernia.  refer to surgery for furthe eval  Her updated medication list for this problem includes:    Naprosyn 500 Mg Tabs (Naproxen) .Marland Kitchen... Take 1 tablet by mouth two times a day    Tylenol/codeine #3 300-30 Mg Tabs (Acetaminophen-codeine) .Marland Kitchen... Take one (1)-two (2) tablets by mouth every 4-6 hrs as needed for pain  Orders: Surgical Referral (Surgery)  Problem # 2:  PSORIASIS (ICD-696.1)  mild plaques.  use topical steroid as directed  Problem # 3:  DERMATOPHYTOSIS, SCALP (ICD-110.0)  no resolution with topical agents.  use lamisil as directed  Orders: Prescription Created Electronically [phone])  Complete Medication List: 1)  Levothroid 50 Mcg Tabs (Levothyroxine sodium) .... Take 1 tablet by mouth once a day 2)  Naprosyn 500 Mg Tabs (Naproxen) .... Take 1 tablet by mouth two times a day 3)  Calcium  600/vitamin D 600-400 Mg-unit Chew (Calcium carbonate-vitamin d) .... Two times a day by mouth 4)  Coq-10 150 Mg Caps (Coenzyme q10) .... Take 1 tablet by mouth once a day 5)  Multivitamins Tabs (Multiple vitamin) .... Take 1 tablet by mouth once a day 6)  Lyrica 75 Mg Caps (Pregabalin) .... Take 1 tab by mouth at bedtime 7)  Metformin Hcl 750 Mg Xr24h-tab (Metformin hcl) .... Take 1 tablet by mouth two times a day 8)  Simvastatin 20 Mg Tabs (Simvastatin) .... Take 1 tab by mouth at bedtime 9)  Fexofenadine Hcl 180 Mg Tabs (Fexofenadine hcl) .... Take 1 tablet by mouth once a day 10)  Nexium 40 Mg Cpdr (Esomeprazole magnesium) .... One by mouth two times a day 11)  Celexa 20 Mg Tabs (Citalopram hydrobromide) .... Take 1 tablet by mouth once a day 12)  Acidophilus Probiotic Blend Caps (Misc intestinal flora regulat) .... Take 1 tablet by mouth once a day 13)  Lovaza 1 Gm Caps (Omega-3-acid ethyl esters) .... Take 1 capsule by mouth two times a day 14)  Proair Hfa 108 (90 Base) Mcg/act Aers (Albuterol sulfate) .... 2 puffs by mouth once daily 15)  Tylenol/codeine #3 300-30 Mg Tabs (Acetaminophen-codeine) .... Take one (1)-two (2) tablets by mouth every 4-6 hrs as needed for pain 16)  Zostavax 98119 Unt/0.28ml Solr (Zoster vaccine live) .... Administer vaccine x 1 17)  Terbinafine Hcl 250 Mg Tabs (Terbinafine hcl) .... One by mouth qd 18)  Triamcinolone Acetonide 0.5 % Crea (Triamcinolone acetonide) .... Apply two times a day  Patient Instructions: 1)  Please schedule a follow-up appointment in 6 months. 2)  BMP prior to visit, ICD-9:  790.29 3)  HbgA1C prior to visit, ICD-9: 790.29 4)  Please return for lab work one (1) week before your next appointment.  5)  Our office will contact you re:  referral to Dr. Corliss Skains Prescriptions: TRIAMCINOLONE ACETONIDE 0.5 % CREA (TRIAMCINOLONE ACETONIDE) apply two times a day  #30 grams x 1   Entered and Authorized by:   D. Thomos Lemons DO   Signed by:   D.  Thomos Lemons DO on 02/26/2010   Method used:   Electronically to        [facility]* (retail)       [address] [place], [place]  [address]       Ph: [phone]       Fax: [phone]   RxID:   [id number] TERBINAFINE HCL 250 MG TABS (TERBINAFINE HCL) one by mouth qd  #60 x 0   Entered and Authorized by:   D. Thomos Lemons DO   Signed by:   D. Thomos Lemons DO on 02/26/2010   Method used:   Electronically to        [facility]* (mail-order)       [address]       [place], [place]  [address]  Ph: [phone]       Fax: [phone]   RxID:   [id number] TERBINAFINE HCL 250 MG TABS (TERBINAFINE HCL) one by mouth qd  #60 x 0   Entered and Authorized by:   D. Thomos Lemons DO   Signed by:   D. Thomos Lemons DO on 02/26/2010   Method used:   Electronically to        [facility] (retail)       [address]       [place], [place]  [address]       Ph: [phone]       Fax: [phone]   RxID:   [id number] ZOSTAVAX 19400 UNT/0.65ML SOLR (ZOSTER VACCINE LIVE) administer vaccine x 1  #1 x 0   Entered and Authorized by:   D. Thomos Lemons DO   Signed by:   D. Thomos Lemons DO on 02/26/2010   Method used:   Print then Give to Patient   RxID:   [id number]   Current Allergies (reviewed today): No known allergies

## 2010-12-15 NOTE — Progress Notes (Signed)
Summary: Prior Authorization Terbanifine  Phone Note Call from Patient Call back at Home Phone [phone]   Caller: Spouse- [name]  Summary of Call: voice message left regarding prior authorization for medication, patients hsuband was not sure what the problem is and asked if we would contact [facility] regarding Terbinafine [phone] Initial call taken by: Glendell Docker CMA,  March 11, 2010 2:18 PM  Follow-up for Phone Call        spoke with [facility], form requested. Follow-up by: Lucious Groves,  March 13, 2010 12:13 PM  Additional Follow-up for Phone Call Additional follow up Details #1::        Insurance has denied rx for Lamisil. Medical criteria for necessity has not been met per Insurance Additional Follow-up by: Glendell Docker CMA,  Mar 18, 2010 9:48 AM

## 2010-12-15 NOTE — Progress Notes (Signed)
Summary: how  much ibuprofen   Phone Note Call from Patient Call back at Home Phone [phone]   Caller: Patient Call For: Shawnte Winton  Summary of Call: Pt states that she was told to stop the Naprosyn and take Ibuprofen.  Patient did not write down and does not remember how much ibuprofen Dr Artist Pais said for her to take.   Initial call taken by: Roselle Locus,  February 27, 2010 3:07 PM  Follow-up for Phone Call        Ibuprofen 400-600 mg two times a day prn Follow-up by: D. Thomos Lemons DO,  February 27, 2010 4:42 PM  Additional Follow-up for Phone Call Additional follow up Details #1::        Attempted to reach pt.  Pt's voicemail had not been set up.  Mervin Kung CMA  February 27, 2010 4:48 PM   Notified pt's husband per Dr. Olegario Messier instructions.  Mervin Kung CMA  March 02, 2010 11:36 AM

## 2010-12-15 NOTE — Progress Notes (Signed)
Summary: needs a referral for hearing test   Phone Note Call from Patient   Caller: Patient Call For: yoo  Summary of Call: needs  a referral to  [facility] to get her hearing checked.  fax [phone] Initial call taken by: Roselle Locus,  August 17, 2010 1:34 PM  Follow-up for Phone Call        Signed order fax to   [facility]  [phone] Follow-up by: Darral Dash,  August 18, 2010 9:10 AM  New Problems: HEARING LOSS (ICD-389.9)   New Problems: HEARING LOSS (ICD-389.9)

## 2010-12-15 NOTE — Miscellaneous (Signed)
Summary: [facility]  Clinical Lists Changes  Medications: Added new medication of MOVIPREP 100 GM  SOLR (PEG-KCL-NACL-NASULF-NA ASC-C) As per prep instructions. - Signed Rx of MOVIPREP 100 GM  SOLR (PEG-KCL-NACL-NASULF-NA ASC-C) As per prep instructions.;  #1 x 0;  Signed;  Entered by: Ezra Sites RN;  Authorized by: Meryl Dare MD [facility] Graham;  Method used: Electronically to [facility] [address] [place], [place], [place]  [address], Ph: [phone], Fax: [phone]    Prescriptions: MOVIPREP 100 GM  SOLR (PEG-KCL-NACL-NASULF-NA ASC-C) As per prep instructions.  #1 x 0   Entered by:   Ezra Sites RN   Authorized by:   Meryl Dare MD [facility]   Signed by:   Ezra Sites RN on 12/03/2009   Method used:   Electronically to        [facility]* (retail)       [address] [place], [place]  [address]       Ph: [phone]       Fax: [phone]   RxID:   [id number]

## 2010-12-15 NOTE — Procedures (Signed)
Summary: Colonoscopy  Patient: Jean Nash Note: All result statuses are Final unless otherwise noted.  Tests: (1) Colonoscopy (COL)   COL Colonoscopy           DONE     [facility]     [address]     [place], [place]  [address]           COLONOSCOPY PROCEDURE REPORT           PATIENT:  Jean, Nash  MR#:  [id number]     BIRTHDATE:  06/28/1941, 68 yrs. old  GENDER:  female           ENDOSCOPIST:  Judie Petit T. Russella Dar, MD, [facility]     Referred by:  Thomos Lemons, DO           PROCEDURE DATE:  12/16/2009     PROCEDURE:  Colonoscopy with hot biopsy     ASA CLASS:  Class II     INDICATIONS:  1) follow-up of polyp, 09/2006 type unknown.           MEDICATIONS:   Fentanyl 62.5 mcg IV, Versed 5 mg IV           DESCRIPTION OF PROCEDURE:   After the risks benefits and     alternatives of the procedure were thoroughly explained, informed     consent was obtained.  Digital rectal exam was performed and     revealed no abnormalities.   The LB [id number] endoscope     was introduced through the anus and advanced to the cecum, which     was identified by both the appendix and ileocecal valve, limited     by a redundant colon.    The quality of the prep was good, using     MoviPrep.  The instrument was then slowly withdrawn as the colon     was fully examined.     <<PROCEDUREIMAGES>>           FINDINGS:  A sessile polyp was found in the mid transverse colon.     It was 10 mm in size. I was unable to snare the polyp due to     location and sessile shape. With hot biopsy forceps, the polyp was     piecemeal cauterized, biopses obtained and sent to pathology.     Moderate diverticulosis was found in the sigmoid colon.  This was     otherwise a normal examination of the colon.   Retroflexed views     in the rectum revealed no abnormalities.  The time to cecum =  6     minutes. The scope was then withdrawn (time =  13.67  min) from the     patient and the procedure  completed.           COMPLICATIONS:  None           ENDOSCOPIC IMPRESSION:     1) 10 mm sessile polyp in the mid transverse colon     2) Moderate diverticulosis in the sigmoid colon           RECOMMENDATIONS:     1) No aspirin or NSAID's for 2 weeks     2) Await pathology results     3) Repeat Colonscopy in 2-5 years. If polyp is adenomatous 2     year repeat, otherwise 5 years     4) High fiber diet           Judie Petit  Dellie Burns, MD, [facility]           n.     eSIGNED:   [name] at 12/16/2009 09:13 AM           Jean Nash, [id number]  Note: An exclamation mark (!) indicates a result that was not dispersed into the flowsheet. Document Creation Date: 12/16/2009 9:13 AM _______________________________________________________________________  (1) Order result status: Final Collection or observation date-time: 12/16/2009 09:07 Requested date-time:  Receipt date-time:  Reported date-time:  Referring Physician:   Ordering Physician: Claudette Head [phone]) Specimen Source:  Source: Launa Grill Order Number: [id number] Lab site:   Appended Document: Colonoscopy recall     Procedures Next Due Date:    Colonoscopy: 12/2011

## 2010-12-15 NOTE — Progress Notes (Signed)
Summary: Medication Refills  Phone Note Refill Request Message from:  Patient on February 12, 2010 8:55 AM  Refills Requested: Medication #1:  LEVOTHROID 50 MCG  TABS Take 1 tablet by mouth once a day   Dosage confirmed as above?Dosage Confirmed   Brand Name Necessary? No   Supply Requested: 3 months  Medication #2:  FEXOFENADINE HCL 180 MG TABS Take 1 tablet by mouth once a day   Dosage confirmed as above?Dosage Confirmed   Brand Name Necessary? No   Supply Requested: 3 months [facility]    Method Requested: Electronic Next Appointment Scheduled: 02-16-10 8am Dr Artist Pais  Initial call taken by: Roselle Locus,  February 12, 2010 8:56 AM  Follow-up for Phone Call        Rx completed in Dr. Tiajuana Amass Follow-up by: Glendell Docker CMA,  February 12, 2010 10:51 AM    Prescriptions: FEXOFENADINE HCL 180 MG TABS (FEXOFENADINE HCL) Take 1 tablet by mouth once a day  #90 x 3   Entered by:   Glendell Docker CMA   Authorized by:   D. Thomos Lemons DO   Signed by:   Glendell Docker CMA on 02/12/2010   Method used:   Electronically to        [facility] ([facility]order)       [address]       [place], [place]  [address]       Ph: [phone]       Fax: [phone]   RxID:   [id number] LEVOTHROID 50 MCG  TABS (LEVOTHYROXINE SODIUM) Take 1 tablet by mouth once a day  #90 x 3   Entered by:   Glendell Docker CMA   Authorized by:   D. Thomos Lemons DO   Signed by:   Glendell Docker CMA on 02/12/2010   Method used:   Electronically to        [facility] ([facility]order)       [address]       [place], [place]  [address]       Ph: [phone]       Fax: [phone]   RxID:   [id number]

## 2010-12-15 NOTE — Letter (Signed)
Summary: [facility] Instructions  Jean Nash  [address] [place], [place] [address]   Phone: [phone]  Fax: [phone]       Jean Nash    Dec 17, 1940    MRN: [id number]        Procedure Day /Date:  Tuesday 12/16/2009     Arrival Time:  7:30 am         Procedure Time:  8:30 am     Location of Procedure:                    _x _   [facility] ([facility])   PREPARATION FOR COLONOSCOPY WITH MOVIPREP   Starting 5 days prior to your procedureThursday 1/27 do not eat nuts, seeds, popcorn, corn, beans, peas,  salads, or any raw vegetables.  Do not take any fiber supplements (e.g. Metamucil, Citrucel, and Benefiber).  THE DAY BEFORE YOUR PROCEDURE         DATE: Monday 1/31 1.  Drink clear liquids the entire day-NO SOLID FOOD  2.  Do not drink anything colored red or purple.  Avoid juices with pulp.  No orange juice.  3.  Drink at least 64 oz. (8 glasses) of fluid/clear liquids during the day to prevent dehydration and help the prep work efficiently.  CLEAR LIQUIDS INCLUDE: Water Jello Ice Popsicles Tea (sugar ok, no milk/cream) Powdered fruit flavored drinks Coffee (sugar ok, no milk/cream) Gatorade Juice: apple, white grape, white cranberry  Lemonade Clear bullion, consomm, broth Carbonated beverages (any kind) Strained chicken noodle soup Hard Candy                             4.  In the morning, mix first dose of MoviPrep solution:    Empty 1 Pouch A and 1 Pouch B into the disposable container    Add lukewarm drinking water to the top line of the container. Mix to dissolve    Refrigerate (mixed solution should be used within 24 hrs)  5.  Begin drinking the prep at 5:00 p.m. The MoviPrep container is divided by 4 marks.   Every 15 minutes drink the solution down to the next mark (approximately 8 oz) until the full liter is complete.   6.  Follow completed prep with 16 oz of clear liquid of your choice (Nothing red or purple).   Continue to drink clear liquids until bedtime.  7.  Before going to bed, mix second dose of MoviPrep solution:    Empty 1 Pouch A and 1 Pouch B into the disposable container    Add lukewarm drinking water to the top line of the container. Mix to dissolve    Refrigerate  THE DAY OF YOUR PROCEDURE      DATE: Tuesday 2/1  Beginning at 3:30 a.m. (5 hours before procedure):         1. Every 15 minutes, drink the solution down to the next mark (approx 8 oz) until the full liter is complete.  2. Follow completed prep with 16 oz. of clear liquid of your choice.    3. You may drink clear liquids until 6:30 am  (2 HOURS BEFORE PROCEDURE).   MEDICATION INSTRUCTIONS  Unless otherwise instructed, you should take regular prescription medications with a small sip of water   as early as possible the morning of your procedure.  Diabetic patients - see separate instructions.  OTHER INSTRUCTIONS  You will need a responsible adult at least 68 years of age to accompany you and drive you home.   This person must remain in the waiting room during your procedure.  Wear loose fitting clothing that is easily removed.  Leave jewelry and other valuables at home.  However, you may wish to bring a book to read or  an iPod/MP3 player to listen to music as you wait for your procedure to start.  Remove all body piercing jewelry and leave at home.  Total time from sign-in until discharge is approximately 2-3 hours.  You should go home directly after your procedure and rest.  You can resume normal activities the  day after your procedure.  The day of your procedure you should not:   Drive   Make legal decisions   Operate machinery   Drink alcohol   Return to work  You will receive specific instructions about eating, activities and medications before you leave.    The above instructions have been reviewed and explained to me by   Ezra Sites RN  December 03, 2009 9:50  AM     I fully understand and can verbalize these instructions _____________________________ Date _________

## 2010-12-15 NOTE — Consult Note (Signed)
[facility]   Imported By: Lanelle Bal 12/03/2009 11:01:59  _____________________________________________________________________  External Attachment:    Type:   Image     Comment:   External Document

## 2010-12-15 NOTE — Miscellaneous (Signed)
Summary: Orders Update  Clinical Lists Changes  Orders: Added new Test order of T-Basic Metabolic Panel [phone]) - Signed Added new Test order of T-Hepatic Function [phone]) - Signed Added new Test order of T-Lipid Profile [phone]) - Signed Added new Test order of T- Hemoglobin A1C (16606-30160) - Signed Added new Test order of T-TSH (10932-35573) - Signed

## 2010-12-15 NOTE — Assessment & Plan Note (Signed)
Summary: 6 MONTH FOLLOW UP/MHF   Vital Signs:  Patient profile:   70 year old female Height:      60 inches Weight:      187.50 pounds BMI:     36.75 O2 Sat:      96 % Temp:     98.0 degrees F oral Pulse rate:   69 / minute Pulse rhythm:   regular Resp:     18 per minute BP sitting:   112 / 80  (right arm) Cuff size:   large  Vitals Entered By: Glendell Docker CMA (August 25, 2010 10:13 AM) CC: 6 Month Follow up  Is Patient Diabetic? Yes Pain Assessment Patient in pain? no      Comments to discuss, has a list   Primary Care Wilver Tignor:  D. Thomos Lemons DO  CC:  6 Month Follow up .  History of Present Illness: 70 y/o white female for f/u pt had right foot surgery x 2 in late May, 2011 poorly healing,  and chronic right foot swelling Dr.  Al Corpus (podiatrist) occ gets stabbing pain in right foot,  2 or 3 times (symptoms last foot seconds) feels like some is thing is breaking in her foot she is going to PT 2 x per week,  massage,  TENs told by podiatrist - it may take 6 months to heal she is elevating foot several times per day  left leg also bothersome occ gets intermittent left knee pain it seems to give out she has been favoring right leg, foot putting more weight/ stresss on left leg  she tapered off citalopram.  no sign change in mood.  still gets tearful when she thinks about her father  Preventive Screening-Counseling & Management  Alcohol-Tobacco     Smoking Status: never  Allergies: No Known Drug Allergies  Past History:  Past Medical History: Current Problems:  CHEST PAIN, ATYPICAL (ICD-786.59) HYPERLIPIDEMIA (ICD-272.4)  NEPHROLITHIASIS, HX OF (ICD-V13.01)   INTERTRIGO, CANDIDAL (ICD-695.89)  UNSPECIFIED ACUTE CONJUNCTIVITIS (ICD-372.00)  SHOULDER PAIN, LEFT (ICD-719.41)  DYSPLASTIC NEVUS, BACK (ICD-448.1) SKIN TAG (ICD-701.9) BACTERIAL PNEUMONIA (ICD-482.9) PELVIC PAIN, RIGHT (ICD-789.09) CARBUNCLE AND FURUNCLE OF BUTTOCK  (ICD-680.5) HELICOBACTER PYLORI INFECTION, HX OF (ICD-V12.71) DERMATITIS (ICD-692.9) CYST, IDIOPATHIC (ICD-364.60) IRRITABLE BOWEL SYNDROME (ICD-564.1) INSOMNIA, CHRONIC (ICD-307.42) GERD (ICD-530.81) COLONIC POLYPS, HX OF (ICD-V12.72) ASTHMA (ICD-493.90) FIBROMYALGIA (ICD-729.1) HYPOTHYROIDISM (ICD-244.9)   DIABETES MELLITUS, TYPE II, BORDERLINE (ICD-790.29) History of migraines/cluster headaches Stress incontinence Macular degeneration History of H. Pylori gastritis   Hx of pneumonia  Hx of torn right knee meniscus  Past Surgical History: Appendectomy Cholecystectomy  Hysterectomy  Tonsillectomy  Eye surgery to repair macular hole   Cataract extraction       right knee arthroscopy - > 10 yrs ago    Family History: Mother died age 98 - stroke and diabetes Family history of breast cancer Father living age 47  No premature CAD        Social History: Occupation:  Retired Catering manager  Married with 3 grown children    Never Smoked   Alcohol use-no          Physical Exam  General:  alert, well-developed, and well-nourished.   Lungs:  normal respiratory effort and normal breath sounds.   Heart:  normal rate, regular rhythm, and no gallop.   Msk:  left  knee - no joint tenderness and no joint instability.   Extremities:  trace left pedal edema and trace right pedal edema.   Neurologic:  cranial nerves II-XII intact  and gait normal.     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  consider short term use of cane/crutches.  continue NSAIDs.  if persistent pain, we discussed ortho referral.  Her updated medication list for this problem includes:    Ibuprofen 200 Mg Tabs (Ibuprofen) .Marland Kitchen... 2 tablets by mouth two times a day    Tylenol/codeine #3 300-30 Mg Tabs (Acetaminophen-codeine) .Marland Kitchen... Take one (1)-two (2) tablets by mouth every 4-6 hrs as needed for  pain  Problem # 2:  HYPERLIPIDEMIA (ICD-272.4) Assessment: Unchanged  Her updated medication list for this problem includes:    Simvastatin 10 Mg Tabs (Simvastatin) ..... One by mouth at bedtime    Lovaza 1 Gm Caps (Omega-3-acid ethyl esters) .Marland Kitchen... Take 1 capsule by mouth two times a day  Problem # 3:  DIABETES MELLITUS, TYPE II, BORDERLINE (ICD-790.29) doing well.  observe off meds.  Pt counseled on diabetic diet  The following medications were removed from the medication list:    Metformin Hcl 750 Mg Xr24h-tab (Metformin hcl) .Marland Kitchen... Take 1 tablet by mouth two times a day  Labs Reviewed: Creat: 0.71 (08/17/2010)     Problem # 4:  FIBROMYALGIA (ICD-729.1) Assessment: Unchanged  Her updated medication list for this problem includes:    Ibuprofen 200 Mg Tabs (Ibuprofen) .Marland Kitchen... 2 tablets by mouth two times a day    Tylenol/codeine #3 300-30 Mg Tabs (Acetaminophen-codeine) .Marland Kitchen... Take one (1)-two (2) tablets by mouth every 4-6 hrs as needed for pain  Complete Medication List: 1)  Levothroid 50 Mcg Tabs (Levothyroxine sodium) .... Take 1 tablet by mouth once a day 2)  Ibuprofen 200 Mg Tabs (Ibuprofen) .... 2 tablets by mouth two times a day 3)  Calcium 600/vitamin D 600-400 Mg-unit Chew (Calcium carbonate-vitamin d) .... Two times a day by mouth 4)  Coq-10 150 Mg Caps (Coenzyme q10) .... Take 1 tablet by mouth once a day 5)  Multivitamins Tabs (Multiple vitamin) .... Take 1 tablet by mouth once a day 6)  Lyrica 75 Mg Caps (Pregabalin) .... Take 1 tab by mouth at bedtime 7)  Simvastatin 10 Mg Tabs (Simvastatin) .... One by mouth at bedtime 8)  Fexofenadine Hcl 180 Mg Tabs (Fexofenadine hcl) .... Take 1 tablet by mouth once a day 9)  Nexium 40 Mg Cpdr (Esomeprazole magnesium) .... One by mouth two times a day 10)  Acidophilus Probiotic Blend Caps (Misc intestinal flora regulat) .... Take 1 tablet by mouth once a day 11)  Lovaza 1 Gm Caps (Omega-3-acid ethyl esters) .... Take 1 capsule by  mouth two times a day 12)  Tylenol/codeine #3 300-30 Mg Tabs (Acetaminophen-codeine) .... Take one (1)-two (2) tablets by mouth every 4-6 hrs as needed for pain 13)  Co Q-10 75 Mg Caps (Coenzyme q10) .... Take 1 capsule by mouth once a day  Other Orders: Influenza Vaccine MCR (04540) Administration Flu vaccine - MCR (J8119)  Patient Instructions: 1)  Please schedule a follow-up appointment in 4 months. 2)  BMP prior to visit, ICD-9:   790.29 3)  HbgA1C prior to visit, ICD-9: 790.29 4)  Please return for lab work one (1) week before your next appointment.  Prescriptions: LOVAZA 1 GM CAPS (OMEGA-3-ACID ETHYL ESTERS) Take 1 capsule by mouth two times a day  #180 x 3   Entered and Authorized by:   D. Thomos Lemons DO   Signed by:  Dondra Spry DO on 08/25/2010   Method used:   Print then Give to Patient   RxID:   [id number] LEVOTHROID 50 MCG  TABS (LEVOTHYROXINE SODIUM) Take 1 tablet by mouth once a day  #90 x 3   Entered and Authorized by:   D. Thomos Lemons DO   Signed by:   D. Thomos Lemons DO on 08/25/2010   Method used:   Print then Give to Patient   RxID:   [id number] SIMVASTATIN 10 MG TABS (SIMVASTATIN) one by mouth at bedtime  #90 x 3   Entered and Authorized by:   D. Thomos Lemons DO   Signed by:   D. Thomos Lemons DO on 08/25/2010   Method used:   Print then Give to Patient   RxID:   [id number]   Current Allergies: No known allergies    Immunizations Administered:  Influenza Vaccine # 1:    Vaccine Type: Fluvax MCR    Site: left deltoid    Mfr: GlaxoSmithKline    Dose: 0.5 ml    Route: IM    Given by: Glendell Docker CMA    Exp. Date: 05/15/2011    Lot #: [id number]    VIS given: 06/09/10 version given August 25, 2010.  Flu Vaccine Consent Questions:    Do you have a history of severe allergic reactions to this vaccine? no    Any prior history of allergic reactions to egg and/or gelatin? no    Do you have a sensitivity to the preservative Thimersol? no     Do you have a past history of Guillan-Barre Syndrome? no    Do you currently have an acute febrile illness? no    Have you ever had a severe reaction to latex? no    Vaccine information given and explained to patient? yes    Are you currently pregnant? no

## 2010-12-15 NOTE — Miscellaneous (Signed)
Summary: Lab Order  Clinical Lists Changes  Orders: Added new Test order of T- Hemoglobin A1C (04540-98119) - Signed

## 2010-12-15 NOTE — Miscellaneous (Signed)
Summary: Orders Update  Clinical Lists Changes  Orders: Added new Test order of TLB-BMP (Basic Metabolic Panel-BMET) (80048-METABOL) - Signed Added new Test order of TLB-Hepatic/Liver Function Pnl (80076-HEPATIC) - Signed Added new Test order of TLB-Lipid Panel (80061-LIPID) - Signed Added new Test order of TLB-TSH (Thyroid Stimulating Hormone) (84443-TSH) - Signed

## 2010-12-15 NOTE — Progress Notes (Signed)
Summary: Medication Questions  Phone Note Call from Patient Call back at Home Phone [phone]   Caller: Spouse- [name] Call For: Dondra Spry DO Summary of Call: patients husband called and left voice message stating patient was advised to re-start Metformin 48 hours after the Catscan , and then she read somewhere else where she was to hold the medication until she follows up with PCP. She would like to know if it is okay to resume the Metformin. Her  next scheduled appointment is for 08/25/2010 Initial call taken by: Glendell Docker CMA,  Mar 23, 2010 10:33 AM  Follow-up for Phone Call        ok to restart metformin tomorrow Follow-up by: D. Thomos Lemons DO,  Mar 23, 2010 12:23 PM  Additional Follow-up for Phone Call Additional follow up Details #1::        attempted to contact patient at [phone],no answer, voice message left informing patient per Dr Artist Pais instructions Additional Follow-up by: Glendell Docker CMA,  Mar 23, 2010 4:41 PM

## 2010-12-15 NOTE — Consult Note (Signed)
Summary: [facility]   Imported By: Lanelle Bal 03/27/2010 11:29:33  _____________________________________________________________________  External Attachment:    Type:   Image     Comment:   External Document

## 2010-12-15 NOTE — Miscellaneous (Signed)
  Clinical Lists Changes  Medications: Added new medication of TYLENOL/CODEINE #3 300-30 MG TABS (ACETAMINOPHEN-CODEINE) Take one (1)-two (2) tablets by mouth every 4-6 hrs as needed for pain - Signed Rx of TYLENOL/CODEINE #3 300-30 MG TABS (ACETAMINOPHEN-CODEINE) Take one (1)-two (2) tablets by mouth every 4-6 hrs as needed for pain;  #40 x 0;  Signed;  Entered by: Oda Cogan;  Authorized by: Meryl Dare MD [facility];  Method used: Print then Give to Patient    Prescriptions: TYLENOL/CODEINE #3 300-30 MG TABS (ACETAMINOPHEN-CODEINE) Take one (1)-two (2) tablets by mouth every 4-6 hrs as needed for pain  #40 x 0   Entered by:   Oda Cogan   Authorized by:   Meryl Dare MD [facility]   Signed by:   Oda Cogan on 12/18/2009   Method used:   Print then Give to Patient   RxID:   [id number]   Appended Document:  Pt came to [facility] to receive medication she could take for arthritis/fibromyalgia. Dr Russella Dar notified and ordered given.

## 2010-12-15 NOTE — Progress Notes (Signed)
Summary: Correct Thyroid  dose  Phone Note Call from Patient Call back at Home Phone [phone]   Caller: Spouse- [name] Call For: Dondra Spry DO Summary of Call: patients husband called and left voice message wanting to know what the correct dosage of her  thyroid medication should be. She is scheduled for a follow up visit on February 12th, 2012 Initial call taken by: Glendell Docker CMA,  October 19, 2010 2:00 PM  Follow-up for Phone Call        dosage should be 50 micrograms once daily Follow-up by: D. Thomos Lemons DO,  October 19, 2010 5:04 PM  Additional Follow-up for Phone Call Additional follow up Details #1::        call returned to patient at [phone], she has been informed of medication dose per Dr Artist Pais instructions. Patient verbalized understanding and agrees. Additional Follow-up by: Glendell Docker CMA,  October 20, 2010 8:29 AM

## 2010-12-15 NOTE — Progress Notes (Signed)
Summary: Status Update  Phone Note Call from Patient Call back at Home Phone [phone]   Caller: Spouse- Jean Nash Call For: Jean Spry DO Summary of Call: patients spouse called and left voice message stating patient was seen today by Ortho. She did not have an MRI done. She had a Xray of her knee which showed inflammation in th joint.  She was given and rx for Oxycodone and 800 Ibuprofen.  Initial call taken by: Glendell Docker CMA,  September 02, 2010 4:30 PM

## 2010-12-15 NOTE — Progress Notes (Signed)
Summary: Left leg ligament injury  Phone Note Call from Patient Call back at Home Phone [phone]   Caller: Spouse Reason for Call: Talk to Nurse Summary of Call: Pt went to emergency room in [facility], was diagnosed with soft tissue damage, left leg ligament, is on oxycodine, ER doc recommended she see ortho doc ASAP, pt would like to know who Dr Artist Pais recommends Initial call taken by: Lannette Donath,  August 31, 2010 10:57 AM  Follow-up for Phone Call        If pt able to see ortho in [place]- go ahead.   If unable, we can refer to orhto in [place] when she gets back ([place] orhto - Dr Thomasena Edis) Follow-up by: D. Thomos Lemons DO,  August 31, 2010 12:04 PM  Additional Follow-up for Phone Call Additional follow up Details #1::        call returned to patient at [phone], patients husband [name] has been advised per Dr Artist Pais instructions. He states they are on there way back home, and would like for Dr Artist Pais to make arrangements for Jean Nash to see an orthopaedist here. Additional Follow-up by: Glendell Docker CMA,  August 31, 2010 2:59 PM  New Problems: KNEE PAIN, LEFT (ICD-719.46)   New Problems: KNEE PAIN, LEFT (ICD-719.46)

## 2010-12-15 NOTE — Letter (Signed)
Summary: Diabetic Instructions  [facility]  [address] [place], [place] [address]   Phone: [phone]  Fax: [phone]    Jean Nash June 23, 1941 MRN: [id number]   _  _   ORAL DIABETIC MEDICATION INSTRUCTIONS  The day before your procedure:   Take your diabetic pill as you do normally  The day of your procedure:   Do not take your diabetic pill    We will check your blood sugar levels during the admission process and again in Recovery before discharging you home  ________________________________________________________________________

## 2010-12-15 NOTE — Miscellaneous (Signed)
Summary: Orders Update  Clinical Lists Changes  Orders: Added new Test order of TLB-A1C / Hgb A1C (Glycohemoglobin) (83036-A1C) - Signed 

## 2010-12-15 NOTE — Miscellaneous (Signed)
Summary: BONE DENSITY  Clinical Lists Changes  Orders: Added new Test order of T-Bone Densitometry (77080) - Signed Added new Test order of T-Lumbar Vertebral Assessment (77082) - Signed 

## 2010-12-17 NOTE — Progress Notes (Signed)
Summary: Citalopram  Phone Note Call from Patient Call back at Home Phone [phone]   Caller: Spouse- [name] Call For: Jean Spry DO Summary of Call: patients husband called and left voice message stating patient weaned herself off of Citalopram, but with everything that has gone on with her, she would like to know if Dr Artist Pais would provide a new rx to  [facility]. Initial call taken by: Glendell Docker CMA,  December 10, 2010 9:06 AM  Follow-up for Phone Call        call returned to patient at [phone], no answer, answer, no voice mail.  Follow-up by: Glendell Docker CMA,  December 11, 2010 8:41 AM    New/Updated Medications: CITALOPRAM HYDROBROMIDE 20 MG TABS (CITALOPRAM HYDROBROMIDE) one by mouth once daily Prescriptions: CITALOPRAM HYDROBROMIDE 20 MG TABS (CITALOPRAM HYDROBROMIDE) one by mouth once daily  #90 x 1   Entered and Authorized by:   D. Thomos Lemons DO   Signed by:   D. Thomos Lemons DO on 12/10/2010   Method used:   Electronically to        [facility] (mail-order)       [address]       [place], [place]  [address]       Ph: [phone]       Fax: [phone]   RxID:   [id number]

## 2010-12-17 NOTE — Progress Notes (Signed)
Summary: refills-- simvastatin, lyrica  Phone Note Call from Patient Call back at Home Phone [phone]   Caller: Patient Call For: D. Thomos Lemons DO Summary of Call: Received voice message from pt requesting refills on Lyrica and Simvastatin. Verified that pt did not pick up refills from [facility]. Marland Kitchen  Spoke to [employer] and gave authorization as noted below.  Can only give 1 refill on Lyrica and refills will expire in 60 days per [facility]. Initial call taken by: Mervin Kung CMA (AAMA),  November 17, 2010 4:03 PM    Prescriptions: SIMVASTATIN 10 MG TABS (SIMVASTATIN) one by mouth at bedtime  #90 x 1   Entered by:   Mervin Kung CMA (AAMA)   Authorized by:   D. Thomos Lemons DO   Signed by:   Mervin Kung CMA (AAMA) on 11/17/2010   Method used:   Telephoned to ...       [facility]* (mail-order)       [address]       [place], [place]  [address]       Ph: [phone]       Fax: [phone]   RxID:   [id number] LYRICA 75 MG  CAPS (PREGABALIN) Take 1 tab by mouth at bedtime  #90 x 1   Entered by:   Mervin Kung CMA (AAMA)   Authorized by:   D. Thomos Lemons DO   Signed by:   Mervin Kung CMA (AAMA) on 11/17/2010   Method used:   Telephoned to ...       [facility] (mail-order)       [address]       [place], [place]  [address]       Ph: [phone]       Fax: [phone]   RxID:   [id number]

## 2010-12-17 NOTE — Progress Notes (Signed)
Summary: Nexium -Mail Order  Phone Note Call from Patient Call back at Home Phone [phone]   Caller: Spouse-Larry  Summary of Call: call from patients wife stating drug plan has changed and  they will cover Nexium. He would like a rx for Nexium  sent to [facility]. If approved by Dr Artist Pais Initial call taken by: Glendell Docker CMA,  November 18, 2010 4:57 PM  Follow-up for Phone Call        ok to send rx for nexium Follow-up by: D. Thomos Lemons DO,  November 19, 2010 5:03 PM  Additional Follow-up for Phone Call Additional follow up Details #1::        Phone Call Completed, rx sent electronically to mail order pharmacy Additional Follow-up by: Glendell Docker CMA,  November 20, 2010 8:40 AM    Prescriptions: NEXIUM 40 MG CPDR (ESOMEPRAZOLE MAGNESIUM) one by mouth two times a day  #180 x 3   Entered by:   Glendell Docker CMA   Authorized by:   D. Thomos Lemons DO   Signed by:   Glendell Docker CMA on 11/20/2010   Method used:   Electronically to        [facility] (mail-order)       [address]       [place], [place]  [address]       Ph: [phone]       Fax: [phone]   RxID:   [id number]

## 2010-12-21 ENCOUNTER — Encounter: Payer: Self-pay | Admitting: Internal Medicine

## 2010-12-21 ENCOUNTER — Other Ambulatory Visit: Payer: Self-pay | Admitting: Internal Medicine

## 2010-12-21 ENCOUNTER — Ambulatory Visit (INDEPENDENT_AMBULATORY_CARE_PROVIDER_SITE_OTHER): Payer: Medicare Other | Admitting: Internal Medicine

## 2010-12-21 DIAGNOSIS — I10 Essential (primary) hypertension: Secondary | ICD-10-CM

## 2010-12-21 DIAGNOSIS — D239 Other benign neoplasm of skin, unspecified: Secondary | ICD-10-CM

## 2010-12-21 DIAGNOSIS — D235 Other benign neoplasm of skin of trunk: Secondary | ICD-10-CM

## 2010-12-21 DIAGNOSIS — M25569 Pain in unspecified knee: Secondary | ICD-10-CM

## 2010-12-23 NOTE — Miscellaneous (Signed)
Summary: Orders Update  Clinical Lists Changes  Orders: Added new Test order of T-Basic Metabolic Panel (80048-22910) - Signed Added new Test order of T- Hemoglobin A1C (83036-23375) - Signed 

## 2010-12-24 ENCOUNTER — Telehealth: Payer: Self-pay | Admitting: Internal Medicine

## 2010-12-31 NOTE — Progress Notes (Signed)
Summary: pathology result  Phone Note Outgoing Call   Summary of Call: call pt - pathology shows biopsied lesions are benign.  plz mail copy of pathology report to pt Initial call taken by: D. Thomos Lemons DO,  December 24, 2010 2:45 PM  Follow-up for Phone Call        Pt notified and result mailed to pt. Follow-up by: Mervin Kung CMA Duncan Dull),  December 24, 2010 2:50 PM

## 2011-01-12 NOTE — Assessment & Plan Note (Signed)
Summary: 4 month follow up/mhf--rm 3   Vital Signs:  Patient profile:   70 year old female Height:      60 inches Weight:      194.25 pounds BMI:     38.07 O2 Sat:      98 % on Room air Temp:     97.6 degrees F oral Pulse rate:   78 / minute Pulse rhythm:   regular Resp:     18 per minute BP sitting:   110 / 80  (right arm) Cuff size:   large  Vitals Entered By: Mervin Kung CMA Duncan Dull) (December 21, 2010 9:11 AM)  O2 Flow:  Room air CC: Pt here for 4 month follow up. Is Patient Diabetic? Yes Comments Pt wants to take Fish oil, Wants to decrease Citalopram to 1/2 daily, has multiple skin tags she would like assessed. Mervin Kung CMA Duncan Dull)  December 21, 2010 9:18 AM     Primary Care Provider:  Dondra Spry DO  CC:  Pt here for 4 month follow up.Marland Kitchen  History of Present Illness: 70 y/o female for follow up  Oct 2011 in [place] - hosp / ER for right knee pain seen by orhto - Dr. supple received cortisone injectoin right knee  better for 2 months but then got worse exp swelling knee replacement planned better with subsequent cortisone injection and prednisone MRI knee performed pt had arthroscopy to improve meniscal tear - in Nov, 2011  grandaughter - getting married in March    Allergies (verified): No Known Drug Allergies  Past History:  Past Medical History: Current Problems:  CHEST PAIN, ATYPICAL (ICD-786.59) HYPERLIPIDEMIA (ICD-272.4)   NEPHROLITHIASIS, HX OF (ICD-V13.01)   INTERTRIGO, CANDIDAL (ICD-695.89)  UNSPECIFIED ACUTE CONJUNCTIVITIS (ICD-372.00)  SHOULDER PAIN, LEFT (ICD-719.41)  DYSPLASTIC NEVUS, BACK (ICD-448.1) SKIN TAG (ICD-701.9) BACTERIAL PNEUMONIA (ICD-482.9) PELVIC PAIN, RIGHT (ICD-789.09) CARBUNCLE AND FURUNCLE OF BUTTOCK (ICD-680.5) HELICOBACTER PYLORI INFECTION, HX OF (ICD-V12.71) DERMATITIS (ICD-692.9) CYST, IDIOPATHIC (ICD-364.60) IRRITABLE BOWEL SYNDROME (ICD-564.1) INSOMNIA, CHRONIC (ICD-307.42) GERD  (ICD-530.81) COLONIC POLYPS, HX OF (ICD-V12.72) ASTHMA (ICD-493.90) FIBROMYALGIA (ICD-729.1) HYPOTHYROIDISM (ICD-244.9)   DIABETES MELLITUS, TYPE II, BORDERLINE (ICD-790.29) History of migraines/cluster headaches Stress incontinence Macular degeneration History of H. 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   Lungs:  normal respiratory effort and normal breath sounds.   Heart:  normal rate, regular rhythm, and no gallop.   Extremities:  trace left pedal edema and trace right pedal edema.   Neurologic:  cranial nerves II-XII intact and gait normal.   Skin:  3-4 mm raised hyperpigmented lesion middle of back,  3 mm verrucous lesion right abd Psych:  normally interactive, good eye contact, not anxious appearing, and not depressed appearing.     Impression & Recommendations:  Problem # 1:  HYPERTENSION (ICD-401.9) Assessment Unchanged  BP  today: 110/80 Prior BP: 112/80 (08/25/2010)  Labs Reviewed: K+: 4.4 (12/15/2010) Creat: : 0.62 (12/15/2010)   Chol: 156 (08/17/2010)   HDL: 36 (08/17/2010)   LDL: 88 (08/17/2010)   TG: 158 (08/17/2010)  Problem # 2:  INSOMNIA, CHRONIC (ICD-307.42) Assessment: Unchanged  Problem # 3:  HYPOTHYROIDISM (ICD-244.9) pt has been taking  medication at bedtime it may be contributing  insomnia pt to take in AM   Her updated medication list for this problem includes:    Levothroid 50 Mcg Tabs  (Levothyroxine sodium) .Marland Kitchen... Take 1 tablet by mouth once a day  Problem # 4:  MOLE (ICD-216.9)  pt's husband notes enlarging mole located middle of her back also has irritating lesion on right side of abd  consent obtained area prepped with betadyne 1% lidocaine with epi use of anesthesia specimens sent to pathology  Orders: Specimen Handling (04540) Shave Skin Lesion < 0.5 cm/trunk/arm/leg (11300) Shave Skin Lesion 0.6-1.0 cm/trunk/arm/leg (11301)  Complete Medication List: 1)  Levothroid 50 Mcg Tabs (Levothyroxine sodium) .... Take 1 tablet by mouth once a day 2)  Calcium 600/vitamin D 600-400 Mg-unit Chew (Calcium carbonate-vitamin d) .... Two times a day by mouth 3)  Coq-10 150 Mg Caps (Coenzyme q10) .... Take 1 tablet by mouth once a day 4)  Multivitamins Tabs (Multiple vitamin) .... Take 1 tablet by mouth once a day 5)  Lyrica 75 Mg Caps (Pregabalin) .... Take 1 tab by mouth at bedtime 6)  Simvastatin 10 Mg Tabs (Simvastatin) .... One by mouth at bedtime 7)  Nexium 40 Mg Cpdr (Esomeprazole magnesium) .... One by mouth two times a day 8)  Acidophilus Probiotic Blend Caps (Misc intestinal flora regulat) .... Take 1 tablet by mouth once a day 9)  Lovaza 1 Gm Caps (Omega-3-acid ethyl esters) .... Take 1 capsule by mouth two times a day 10)  Tylenol/codeine #3 300-30 Mg Tabs (Acetaminophen-codeine) .... Take one (1)-two (2) tablets by mouth every 4-6 hrs as needed for pain 11)  Citalopram Hydrobromide 20 Mg Tabs (Citalopram hydrobromide) .... One by mouth once daily 12)  Stool Softener 100 Mg Caps (Docusate sodium) .... Take 1 capsule by mouth three times a day. 13)  Mobic 15 Mg Tabs (Meloxicam) .... Take 1 tablet by mouth once a day. 14)  Butalbital-apap-caffeine 50-325-40 Mg Caps (Butalbital-apap-caffeine) .... As needed.  Patient Instructions: 1)  Please schedule a follow-up appointment in 3 months. 2)  BMP prior to visit, ICD-9: 401.9 3)  A1c:  790.29 4)  TSH prior to visit,  ICD-9: 244.9 5)  CBC:  401.9 6)  Please return for lab work one (1) week before your next appointment.  Prescriptions: LOVAZA 1 GM CAPS (OMEGA-3-ACID ETHYL ESTERS) Take 1 capsule by mouth two times a day  #180 x 3   Entered and Authorized by:   D. Thomos Lemons DO   Signed by:   D. Thomos Lemons DO on 12/21/2010   Method used:   Print then Give to Patient   RxID:   [id number]    Orders Added: 1)  Specimen Handling [99000] 2)  Est. Patient Level III [08657] 3)  Shave Skin Lesion < 0.5 cm/trunk/arm/leg [11300] 4)  Shave Skin Lesion 0.6-1.0 cm/trunk/arm/leg [11301]    Current Allergies (reviewed today): No known allergies   Appended Document: 4 month follow up/mhf--rm 3 abdominal lesion 2-3 mm in size  back lesion 6-8 mm (pedunculated)  shave biopsy performed for both lesions

## 2011-02-03 LAB — GLUCOSE, CAPILLARY
Glucose-Capillary: 104 mg/dL — ABNORMAL HIGH (ref 70–99)
Glucose-Capillary: 90 mg/dL (ref 70–99)

## 2011-02-10 ENCOUNTER — Telehealth: Payer: Self-pay | Admitting: Internal Medicine

## 2011-02-10 DIAGNOSIS — L680 Hirsutism: Secondary | ICD-10-CM

## 2011-02-10 NOTE — Telephone Encounter (Signed)
DR Joellyn Quails HER TSTESTOSTERONE WAS HIGHER THAN NORMAL  SHE GOES FOR BLOODWORK NEXT WEEK FOR HER NORMAL TESTS AND WONDERED IF DR YOO WANTED TO CHECK TESTOSTERONE AND ESTROGEN AT THAT TIME

## 2011-02-12 NOTE — Telephone Encounter (Signed)
Ok to repeat total testosterone, estradiol level,  FSH, LH - use hirsutism code

## 2011-02-13 NOTE — Telephone Encounter (Signed)
Call placed to patient at [phone] she was informed of  The okay to add the additional blood work.  Lab orders have been entered for [facility] for the week of February 15, 2011

## 2011-02-15 ENCOUNTER — Other Ambulatory Visit: Payer: Self-pay | Admitting: Internal Medicine

## 2011-02-15 DIAGNOSIS — R7309 Other abnormal glucose: Secondary | ICD-10-CM

## 2011-02-15 DIAGNOSIS — I10 Essential (primary) hypertension: Secondary | ICD-10-CM

## 2011-02-15 LAB — BASIC METABOLIC PANEL
BUN: 21 mg/dL (ref 6–23)
Calcium: 9.8 mg/dL (ref 8.4–10.5)
Chloride: 100 mEq/L (ref 96–112)
Creat: 0.77 mg/dL (ref 0.40–1.20)

## 2011-02-15 LAB — HEMOGLOBIN A1C
Hgb A1c MFr Bld: 5.9 % — ABNORMAL HIGH (ref <5.7)
Mean Plasma Glucose: 123 mg/dL — ABNORMAL HIGH (ref <117)

## 2011-02-16 LAB — TESTOSTERONE: Testosterone: 79.22 ng/dL — ABNORMAL HIGH (ref 10–70)

## 2011-02-16 LAB — ESTRADIOL: Estradiol: 21.4 pg/mL

## 2011-02-16 LAB — FOLLICLE STIMULATING HORMONE: FSH: 43.5 m[IU]/mL

## 2011-02-16 LAB — LUTEINIZING HORMONE: LH: 24.2 m[IU]/mL

## 2011-02-23 ENCOUNTER — Ambulatory Visit: Payer: Medicare Other | Admitting: Internal Medicine

## 2011-02-25 LAB — COMPREHENSIVE METABOLIC PANEL
ALT: 22 U/L (ref 0–35)
AST: 20 U/L (ref 0–37)
Albumin: 3.3 g/dL — ABNORMAL LOW (ref 3.5–5.2)
Calcium: 8.5 mg/dL (ref 8.4–10.5)
GFR calc Af Amer: >60 mL/min (ref >60)
Potassium: 4.4 mEq/L (ref 3.5–5.1)
Sodium: 139 mEq/L (ref 135–145)
Total Protein: 5.5 g/dL — ABNORMAL LOW (ref 6.0–8.3)

## 2011-02-25 LAB — DIFFERENTIAL
Basophils Absolute: 0 K/uL (ref 0.0–0.1)
Basophils Relative: 0 % (ref 0–1)
Eosinophils Absolute: 0.3 K/uL (ref 0.0–0.7)
Eosinophils Relative: 3 % (ref 0–5)
Lymphocytes Relative: 24 % (ref 12–46)
Lymphs Abs: 2 K/uL (ref 0.7–4.0)
Monocytes Absolute: 0.4 K/uL (ref 0.1–1.0)
Monocytes Relative: 5 % (ref 3–12)
Neutro Abs: 5.8 K/uL (ref 1.7–7.7)
Neutrophils Relative %: 68 % (ref 43–77)

## 2011-02-25 LAB — CBC
MCHC: 34.3 g/dL (ref 30.0–36.0)
MCHC: 33.9 g/dL (ref 30.0–36.0)
MCV: 88.1 fL (ref 78.0–100.0)
Platelets: 214 10*3/uL (ref 150–400)
Platelets: 178 10*3/uL (ref 150–400)
RBC: 4.39 MIL/uL (ref 3.87–5.11)
RDW: 14.7 % (ref 11.5–15.5)
WBC: 8.5 10*3/uL (ref 4.0–10.5)

## 2011-02-25 LAB — GLUCOSE, CAPILLARY: Glucose-Capillary: 96 mg/dL (ref 70–99)

## 2011-02-25 LAB — LIPID PANEL
Cholesterol: 144 mg/dL (ref 0–200)
HDL: 28 mg/dL — ABNORMAL LOW (ref >39)
Total CHOL/HDL Ratio: 5.1 RATIO
VLDL: 28 mg/dL (ref 0–40)

## 2011-02-25 LAB — CARDIAC PANEL(CRET KIN+CKTOT+MB+TROPI)
CK, MB: 2 ng/mL (ref 0.3–4.0)
CK, MB: 2 ng/mL (ref 0.3–4.0)
Total CK: 61 U/L (ref 7–177)
Troponin I: <0.01 ng/mL (ref 0.00–0.06)

## 2011-02-25 LAB — BASIC METABOLIC PANEL WITH GFR
BUN: 13 mg/dL (ref 6–23)
CO2: 28 meq/L (ref 19–32)
Calcium: 9.1 mg/dL (ref 8.4–10.5)
Chloride: 104 meq/L (ref 96–112)
Creatinine, Ser: 0.7 mg/dL (ref 0.4–1.2)
GFR calc non Af Amer: >60 mL/min
Glucose, Bld: 127 mg/dL — ABNORMAL HIGH (ref 70–99)
Potassium: 4 meq/L (ref 3.5–5.1)
Sodium: 141 meq/L (ref 135–145)

## 2011-02-25 LAB — D-DIMER, QUANTITATIVE: D-Dimer, Quant: <0.22 ug/mL-FEU (ref 0.00–0.48)

## 2011-02-25 LAB — HEMOGLOBIN A1C
Hgb A1c MFr Bld: 6 % (ref 4.6–6.1)
Mean Plasma Glucose: 126 mg/dL

## 2011-02-25 LAB — TSH: TSH: 2.442 u[IU]/mL (ref 0.350–4.500)

## 2011-02-25 LAB — POCT CARDIAC MARKERS

## 2011-03-02 ENCOUNTER — Encounter: Payer: Self-pay | Admitting: Internal Medicine

## 2011-03-03 ENCOUNTER — Encounter: Payer: Self-pay | Admitting: Internal Medicine

## 2011-03-03 ENCOUNTER — Ambulatory Visit (INDEPENDENT_AMBULATORY_CARE_PROVIDER_SITE_OTHER): Payer: Medicare Other | Admitting: Internal Medicine

## 2011-03-03 ENCOUNTER — Ambulatory Visit: Payer: PRIVATE HEALTH INSURANCE | Admitting: Internal Medicine

## 2011-03-03 VITALS — HR 58 | Temp 97.7°F | Resp 18 | Wt 198.0 lb

## 2011-03-03 DIAGNOSIS — R7309 Other abnormal glucose: Secondary | ICD-10-CM

## 2011-03-03 DIAGNOSIS — L680 Hirsutism: Secondary | ICD-10-CM

## 2011-03-03 DIAGNOSIS — M25569 Pain in unspecified knee: Secondary | ICD-10-CM

## 2011-03-03 DIAGNOSIS — I10 Essential (primary) hypertension: Secondary | ICD-10-CM

## 2011-03-03 DIAGNOSIS — N3281 Overactive bladder: Secondary | ICD-10-CM

## 2011-03-03 DIAGNOSIS — E039 Hypothyroidism, unspecified: Secondary | ICD-10-CM

## 2011-03-03 DIAGNOSIS — R32 Unspecified urinary incontinence: Secondary | ICD-10-CM

## 2011-03-03 DIAGNOSIS — N318 Other neuromuscular dysfunction of bladder: Secondary | ICD-10-CM

## 2011-03-03 MED ORDER — SOLIFENACIN SUCCINATE 5 MG PO TABS: ORAL_TABLET | ORAL | Status: DC

## 2011-03-03 NOTE — Progress Notes (Signed)
Subjective:    [name], female    DOB: [birth year], 70 y.o.   MRN: [id number]  Urinary Frequency  This is a chronic problem. The current episode started more than 1 year ago. The problem occurs intermittently. The problem has been gradually worsening. There has been no fever. Associated symptoms include frequency. There is no history of recurrent UTIs or a urological procedure.   Abnormal glucose - no change  Pt also of worsening coarse facial hairs.  She has had mild symptoms in the past.   Reviewed recent blood work.  Pt has slightly elevated  testosterone levels. She has noted increased libido for years.  Hypothyroidism - stable    Review of Systems  Genitourinary: Positive for frequency.   Stress Incontinence    Past Medical History  Diagnosis Date  . Chest pain, atypical     Negative stress test 02/2009  . Other and unspecified hyperlipidemia   . Personal history of urinary calculi   . Pain in joint, shoulder region   . Irritable bowel syndrome   . Chronic insomnia   . GERD (gastroesophageal reflux disease)   . History of colonic polyps   . Asthma   . Fibromyalgia   . Hypothyroidism   . Other abnormal glucose   . Stress incontinence, female   . Macular degeneration   . Cluster headaches     history of migraines  . Meniscus tear     history of right knee torn meniscus    History   Social History  . Marital Status: Married    Spouse Name: N/A    Number of Children: N/A  . Years of Education: N/A   Occupational History  . Not on file.   Social History Main Topics  . Smoking status: Never Smoker   . Smokeless tobacco: Not on file  . Alcohol Use: No  . Drug Use: Not on file  . Sexually Active: Not on file   Other Topics Concern  . 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 Appendectomy   .  Cholecystectomy   . Abdominal hysterectomy   . Tonsillectomy   . Eye surgery     to repair macular hole  . Cataract extraction, bilateral   . Knee arthroscopy     > 10 years ago    Family History  Problem Relation Age of Onset  . Stroke Mother     died age 62  . Diabetes Mother     No Known Allergies  Current Outpatient Prescriptions on File Prior to Visit  Medication Sig Dispense Refill  . butalbital-aspirin-caffeine (BUTALBITAL COMPOUND/ASA) 50-325-40 MG per tablet Take 1 tablet by mouth every 6 (six) hours as needed.        . calcium carbonate (OS-CAL) 600 MG TABS Take 600 mg by mouth 2 (two) times daily with a meal.        . citalopram (CELEXA) 20 MG tablet Take 20 mg by mouth daily.        . Coenzyme Q10 150 MG CAPS Take 150 mg by mouth daily.        Marland Kitchen docusate sodium (STOOL SOFTENER) 100 MG capsule Take 100 mg by mouth 3 (three) times daily as needed.        Marland Kitchen esomeprazole (NEXIUM) 40 MG  capsule Take 40 mg by mouth 2 (two) times daily.        . meloxicam (MOBIC) 15 MG tablet Take 15 mg by mouth daily.        . Multiple Vitamin (MULTIVITAMIN PO) Take by mouth daily.        Marland Kitchen omega-3 acid ethyl esters (LOVAZA) 1 G capsule Take 2 g by mouth 2 (two) times daily.        . pregabalin (LYRICA) 75 MG capsule Take 75 mg by mouth at bedtime.        . Probiotic Product (ACIDOPHILUS PROBIOTIC BLEND) CAPS Take by mouth daily.        . simvastatin (ZOCOR) 10 MG tablet Take 10 mg by mouth at bedtime.        Marland Kitchen acetaminophen-codeine (TYLENOL #3) 300-30 MG per tablet Take 1-2 tablets by mouth every 4 (four) hours as needed.          Pulse 58  Temp(Src) 97.7 F (36.5 C) (Oral)  Resp 18  Wt 198 lb (89.812 kg)  SpO2 97%    Objective:   Physical Exam  Constitutional: She appears well-developed and well-nourished.  HENT:  Head: Normocephalic and atraumatic.  Mouth/Throat: Oropharynx is clear and moist.  Neck: Normal range of motion. No thyromegaly present.  Cardiovascular: Normal  rate, regular rhythm and normal heart sounds.   Pulmonary/Chest: Effort normal and breath sounds normal. She has no wheezes. She has no rales.  Abdominal: Soft. Bowel sounds are normal. There is no tenderness.  Lymphadenopathy:    She has no cervical adenopathy.  Neurological:       Ambulates with cane  Skin: Skin is warm and dry.       Coarse facial esp chin  Psychiatric: She has a normal mood and affect. Her behavior is normal.          Assessment & Plan:

## 2011-03-05 ENCOUNTER — Ambulatory Visit: Payer: PRIVATE HEALTH INSURANCE | Admitting: Internal Medicine

## 2011-03-08 ENCOUNTER — Telehealth (INDEPENDENT_AMBULATORY_CARE_PROVIDER_SITE_OTHER): Payer: Medicare Other | Admitting: Internal Medicine

## 2011-03-08 DIAGNOSIS — R32 Unspecified urinary incontinence: Secondary | ICD-10-CM

## 2011-03-08 DIAGNOSIS — E039 Hypothyroidism, unspecified: Secondary | ICD-10-CM

## 2011-03-08 NOTE — Telephone Encounter (Signed)
Ok for refill of levothyroxine Okay for a three-month supply of VESIcare to mail-order Yes, stop vesicare before surgery

## 2011-03-08 NOTE — Telephone Encounter (Signed)
She needs an rx for levothryoxin tab 50 mg take 1 per day to [facility].  For 90 day supply   She got samples of the vesicare and it seems to be working.  She would like 90 day rx to [facility]. Is the vesicare one of the meds she should stop prior to her surgery.  She is scheduled to stop her meds today please advise   [phone]

## 2011-03-09 MED ORDER — LEVOTHYROXINE SODIUM 50 MCG PO TABS: 50.0000 ug | ORAL_TABLET | Freq: Every day | ORAL | Status: DC

## 2011-03-09 MED ORDER — SOLIFENACIN SUCCINATE 5 MG PO TABS: ORAL_TABLET | ORAL | Status: DC

## 2011-03-09 NOTE — Telephone Encounter (Signed)
Call placed to patient at [phone], no answer. A detailed voice was left informing patient per Dr Artist Pais instructions

## 2011-03-09 NOTE — Telephone Encounter (Signed)
Jean Nash ASKING ABOUT STOPPING VESICARE AND REFILLS.  ADVISED HIM DR Joellyn Quails FOR [name] TO STOP VESICARE BEFORE SURGERY AND OKED REFILL OF LEVOTHYROXINE AND 3 MONTH SUPPLY OF VESICARE

## 2011-03-10 ENCOUNTER — Other Ambulatory Visit: Payer: Self-pay | Admitting: Orthopedic Surgery

## 2011-03-10 ENCOUNTER — Encounter (HOSPITAL_COMMUNITY): Payer: Medicare Other

## 2011-03-10 LAB — COMPREHENSIVE METABOLIC PANEL
ALT: 21 U/L (ref 0–35)
Albumin: 3.7 g/dL (ref 3.5–5.2)
Alkaline Phosphatase: 74 U/L (ref 39–117)
Glucose, Bld: 108 mg/dL — ABNORMAL HIGH (ref 70–99)
Potassium: 3.9 mEq/L (ref 3.5–5.1)
Sodium: 141 mEq/L (ref 135–145)
Total Protein: 6.6 g/dL (ref 6.0–8.3)

## 2011-03-10 LAB — URINALYSIS, ROUTINE W REFLEX MICROSCOPIC
Bilirubin Urine: NEGATIVE
Hgb urine dipstick: NEGATIVE
Specific Gravity, Urine: 1.013 (ref 1.005–1.030)
Urobilinogen, UA: 0.2 mg/dL (ref 0.0–1.0)

## 2011-03-10 LAB — CBC
HCT: 43.8 % (ref 36.0–46.0)
MCH: 29.3 pg (ref 26.0–34.0)
MCV: 89.2 fL (ref 78.0–100.0)
RDW: 13.5 % (ref 11.5–15.5)
WBC: 8.3 10*3/uL (ref 4.0–10.5)

## 2011-03-10 LAB — APTT: aPTT: 27 seconds (ref 24–37)

## 2011-03-10 LAB — PROTIME-INR: INR: 0.97 (ref 0.00–1.49)

## 2011-03-14 ENCOUNTER — Encounter: Payer: Self-pay | Admitting: Internal Medicine

## 2011-03-14 DIAGNOSIS — N3281 Overactive bladder: Secondary | ICD-10-CM | POA: Insufficient documentation

## 2011-03-14 NOTE — Assessment & Plan Note (Signed)
Stable.  Monitor TFTs  Lab Results  Component Value Date   TSH 2.191 08/17/2010

## 2011-03-14 NOTE — Assessment & Plan Note (Signed)
Pt c/o worsening coarse facial hair. Her testosterone level is slightly elevated. She has had chronic mild hirsutism. Question congenital adrenal hyperplasia.   Refer to endo for further eval I suggest laser hair removal

## 2011-03-14 NOTE — Assessment & Plan Note (Signed)
Total knee replacement scheduled for 4/30 No additional pre op testing. Reviewed medications to discontinue before surgery

## 2011-03-14 NOTE — Assessment & Plan Note (Deleted)
   BP Readings from Last 3 Encounters:  12/21/10 110/80  08/25/10 112/80  02/26/10 110/72

## 2011-03-14 NOTE — Assessment & Plan Note (Signed)
Pt experiencing urinary freq and urgency. Trial of low dose vesicare We discussed potential side effects

## 2011-03-14 NOTE — Assessment & Plan Note (Signed)
Stable.  Continue mgt with diet.  Exercise limited by upcoming knee replacement  Lab Results  Component Value Date   HGBA1C 5.9* 02/15/2011

## 2011-03-15 ENCOUNTER — Inpatient Hospital Stay (HOSPITAL_COMMUNITY)
Admission: RE | Admit: 2011-03-15 | Discharge: 2011-03-18 | DRG: 470 | Disposition: A | Discharge status: Skilled Nursing Facility | Payer: Medicare Other | Source: Ambulatory Visit | Attending: Orthopedic Surgery | Admitting: Orthopedic Surgery

## 2011-03-15 DIAGNOSIS — E7800 Pure hypercholesterolemia, unspecified: Secondary | ICD-10-CM | POA: Diagnosis present

## 2011-03-15 DIAGNOSIS — Z79899 Other long term (current) drug therapy: Secondary | ICD-10-CM

## 2011-03-15 DIAGNOSIS — Z87891 Personal history of nicotine dependence: Secondary | ICD-10-CM

## 2011-03-15 DIAGNOSIS — G43909 Migraine, unspecified, not intractable, without status migrainosus: Secondary | ICD-10-CM | POA: Diagnosis present

## 2011-03-15 DIAGNOSIS — K449 Diaphragmatic hernia without obstruction or gangrene: Secondary | ICD-10-CM | POA: Diagnosis present

## 2011-03-15 DIAGNOSIS — R32 Unspecified urinary incontinence: Secondary | ICD-10-CM | POA: Diagnosis present

## 2011-03-15 DIAGNOSIS — E663 Overweight: Secondary | ICD-10-CM | POA: Diagnosis present

## 2011-03-15 DIAGNOSIS — D62 Acute posthemorrhagic anemia: Secondary | ICD-10-CM | POA: Diagnosis not present

## 2011-03-15 DIAGNOSIS — Z01812 Encounter for preprocedural laboratory examination: Secondary | ICD-10-CM

## 2011-03-15 DIAGNOSIS — K219 Gastro-esophageal reflux disease without esophagitis: Secondary | ICD-10-CM | POA: Diagnosis present

## 2011-03-15 DIAGNOSIS — R42 Dizziness and giddiness: Secondary | ICD-10-CM | POA: Diagnosis present

## 2011-03-15 DIAGNOSIS — J45909 Unspecified asthma, uncomplicated: Secondary | ICD-10-CM | POA: Diagnosis present

## 2011-03-15 DIAGNOSIS — IMO0001 Reserved for inherently not codable concepts without codable children: Secondary | ICD-10-CM | POA: Diagnosis present

## 2011-03-15 DIAGNOSIS — F341 Dysthymic disorder: Secondary | ICD-10-CM | POA: Diagnosis present

## 2011-03-15 DIAGNOSIS — Z780 Asymptomatic menopausal state: Secondary | ICD-10-CM

## 2011-03-15 DIAGNOSIS — E039 Hypothyroidism, unspecified: Secondary | ICD-10-CM | POA: Diagnosis present

## 2011-03-15 DIAGNOSIS — M1710 Unilateral primary osteoarthritis, unspecified knee: Principal | ICD-10-CM | POA: Diagnosis present

## 2011-03-15 HISTORY — PX: JOINT REPLACEMENT: SHX530

## 2011-03-15 LAB — ABO/RH: ABO/RH(D): A POS

## 2011-03-15 LAB — TYPE AND SCREEN

## 2011-03-16 LAB — CBC
Hemoglobin: 10.8 g/dL — ABNORMAL LOW (ref 12.0–15.0)
RBC: 3.76 MIL/uL — ABNORMAL LOW (ref 3.87–5.11)
WBC: 12.3 10*3/uL — ABNORMAL HIGH (ref 4.0–10.5)

## 2011-03-16 LAB — BASIC METABOLIC PANEL
CO2: 25 mEq/L (ref 19–32)
Calcium: 8.4 mg/dL (ref 8.4–10.5)
Chloride: 104 mEq/L (ref 96–112)
GFR calc Af Amer: >60 mL/min (ref >60)
Sodium: 136 mEq/L (ref 135–145)

## 2011-03-17 ENCOUNTER — Telehealth: Payer: Self-pay | Admitting: *Deleted

## 2011-03-17 LAB — BASIC METABOLIC PANEL
CO2: 26 mEq/L (ref 19–32)
Chloride: 100 mEq/L (ref 96–112)
GFR calc Af Amer: >60 mL/min (ref >60)

## 2011-03-17 LAB — CBC
Hemoglobin: 9.4 g/dL — ABNORMAL LOW (ref 12.0–15.0)
MCH: 29.4 pg (ref 26.0–34.0)
MCV: 90 fL (ref 78.0–100.0)
RBC: 3.2 MIL/uL — ABNORMAL LOW (ref 3.87–5.11)

## 2011-03-17 NOTE — Telephone Encounter (Signed)
Voice message left from [facility] regarding manufacture change on the Levothyroxine. The pharmacy would like to know if it is okay to change medication due to manufacturer change from levothyroid to levothyroxine. Ref # [id number]. Call back [phone]

## 2011-03-17 NOTE — Telephone Encounter (Signed)
Yes, ok to change

## 2011-03-17 NOTE — Op Note (Signed)
Jean Nash, Jean Nash NO.:  [id number]  MEDICAL RECORD NO.:  [id number]           PATIENT TYPE:  I  LOCATION:  0002                         FACILITY:  [facility]  PHYSICIAN:  Ollen Gross, M.D.    DATE OF BIRTH:  December 01, 1940  DATE OF PROCEDURE: DATE OF DISCHARGE:                              OPERATIVE REPORT   PREOPERATIVE DIAGNOSIS:  Osteoarthritis of left knee.  POSTOPERATIVE DIAGNOSIS:  Osteoarthritis of left knee.  PROCEDURE:  Left total knee arthroplasty.  SURGEON:  Ollen Gross, M.D.  ASSISTANT:  Rozell Searing, [facility]  ANESTHESIA:  General.  ESTIMATED BLOOD LOSS:  Minimal.  DRAINS:  Hemovac x1.  TOURNIQUET TIME:  37 minutes at 300 mmHg.  COMPLICATIONS:  None.  CONDITION:  Stable to recovery.  BRIEF CLINICAL NOTE:  Jean Nash is a 70 year old female with advanced arthritis of the left worse than right knee with progressively worsening pain and dysfunction.  She has failed nonoperative management and presents for total knee arthroplasty.  PROCEDURE IN DETAIL:  After successful administration of general anesthetic, a tourniquet was placed high on her left thigh and her left lower extremity was prepped and draped in the usual sterile fashion. Extremities wrapped in Esmarch, knee flexed, tourniquet inflated to 300 mmHg.  Midline incision was made with a 10-blade through subcutaneous tissue to the level of the extensor mechanism.  A fresh blade is used to make a medial parapatellar arthrotomy.  Soft tissue on the proximal medial tibia is subperiosteally elevated to the joint line with a knife and into the semimembranosus bursa with a Cobb elevator.  Soft tissue laterally is elevated with attention being paid to avoiding patellar tendon on tibial tubercle.  Patella was everted, knee flexed to 90 degrees and ACL and PCL removed.  Drill was used to create a starting hole in the distal femur and the canal was thoroughly irrigated.  The 5 degrees left  valgus alignment guide is placed and the distal femoral cutting block is pinned to remove 11 mm off the distal femur.  Distal femoral resection is made with an oscillating saw.  The tibia subluxed forward and the menisci removed.  The extramedullary tibial alignment guide is placed, referencing proximally at the medial aspect of the tibial tubercle and distally along the second metatarsal axis and tibial crest.  The block is pinned to remove 2 mm off the non- deficient medial side.  Tibial resection is made with an oscillating saw.  Size 2.5 is most appropriate.  Tibial component and the proximal tibia is prepared to modular drill and keel punch for the size 2.5.  Femoral sizing guide was placed and size 3 is most appropriate. Rotation is marked off the epicondylar axis.  Size 3 cutting block is placed and anterior, posterior and chamfer cuts were made. Intercondylar block is placed and that cut was made.  Trial size 3 posterior stabilized femur was placed.  A 10-mm posterior stabilized rotating platform insert trial was placed.  With the 10, full extension is achieved with excellent varus-valgus and anterior-posterior balance throughout.  Full range of motion.  Patella was everted and thickness measured to be  22 mm.  Freehand resection is taken to 12 mm, 35 template is placed, lug holes were drilled, trial patella was placed and it tracks normally.  Osteophytes removed off the posterior femur with the trial in place.  All trials were removed and the cut bone surfaces are prepared with pulsatile lavage.  Cement was mixed and once ready for implantation, the size 2.5 mobile bearing tibial tray, size 3 posterior stabilized femur, and 35 patella were cemented into place and patella was held with a clamp.  Trial 10-mm inserts placed, knee held in full extension all extruded cement removed.  When the cement is fully hardened, then the permanent 10-mm posterior stabilized rotating platform  insert is placed in the tibial tray.  Wound was copiously irrigated with saline solution and the arthrotomy closed over Hemovac drain with interrupted #1 PDS.  Flexion against gravity is about 135 degrees and the patella tracks normally.  Tourniquet released total time 37 minutes.  Subcu was closed with interrupted 2-0 Vicryl, subcuticular running 4-0 Monocryl.  Catheter for the Marcaine pain pump was placed and pumps initiated.  Incision is cleaned and dried and Steri-Strips and a bulky sterile dressing applied.  She is then placed into a knee immobilizer, awakened and transported to recovery in stable condition.     Ollen Gross, M.D.     FA/MEDQ  D:  03/15/2011  T:  03/15/2011  Job:  [id number]  Electronically Signed by Ollen Gross M.D. on 03/17/2011 10:18:02 AM

## 2011-03-18 LAB — CBC
HCT: 27.1 % — ABNORMAL LOW (ref 36.0–46.0)
MCH: 28.9 pg (ref 26.0–34.0)
MCV: 90 fL (ref 78.0–100.0)
Platelets: 149 10*3/uL — ABNORMAL LOW (ref 150–400)
RBC: 3.01 MIL/uL — ABNORMAL LOW (ref 3.87–5.11)

## 2011-03-18 NOTE — Telephone Encounter (Signed)
Clarification faxed to [facility] [phone]

## 2011-03-22 NOTE — Discharge Summary (Signed)
  Jean Nash, Jean Nash              ACCOUNT NO.:  [id number]  MEDICAL RECORD NO.:  [id number]           PATIENT TYPE:  I  LOCATION:  1611                         FACILITY:  [facility]  PHYSICIAN:  Ollen Gross, M.D.    DATE OF BIRTH:  [birth year]  DATE OF ADMISSION:  03/15/2011 DATE OF DISCHARGE:  03/18/2011                        DISCHARGE SUMMARY - REFERRING   ADDENDUM:  MEDICATION:  Please add medication Nu-Iron 150 mg one p.o. b.i.d. for 3 weeks and discontinue the Nu-Iron.     Alexzandrew L. Julien Girt, P.A.C.   ______________________________ Ollen Gross, M.D.    ALP/MEDQ  D:  03/18/2011  T:  03/18/2011  Job:  [id number]  Electronically Signed by Patrica Duel P.A.C. on 03/18/2011 10:09:25 AM Electronically Signed by Ollen Gross M.D. on 03/22/2011 06:47:55 PM

## 2011-03-22 NOTE — H&P (Signed)
Jean Nash, Jean Nash              ACCOUNT NO.:  [id number]  MEDICAL RECORD NO.:  [id number]           PATIENT TYPE:  I  LOCATION:  0002                         FACILITY:  [facility]  PHYSICIAN:  Alexzandrew L. Perkins, P.A.C.DATE OF BIRTH:  Apr 18, 1941  DATE OF ADMISSION:  03/15/2011 DATE OF DISCHARGE:                             HISTORY & PHYSICAL   CHIEF COMPLAINT:  Left greater than right knee pain.  HISTORY OF PRESENT ILLNESS:  The patient is a 70 year old female who has been seen by Dr. Lequita Halt for ongoing knee pain.  Her left knee is more problematic and symptomatic than the right.  She has been treated conservatively in the past.  Unfortunately she has progressive arthritis which has been refractory to nonoperative management.  She has undergone injections in the past which only provided temporary relief.  It is felt that she would benefit from undergoing surgical intervention.  The risks and benefits have been discussed and she elected to proceed with surgery.  ALLERGIES:  NO DOCUMENTED ALLERGIES ON HISTORY.  CURRENT MEDICATIONS: 1. Nexium. 2. Levothyroxine. 3. Mobic. 4. Lovaza. 5. Calcium plus D. 6. Stool softener. 7. Adult multivitamin. 8. Calcium and fiber. 9. Probiotics. 10.Co-Q-10. 11.Lyrica. 12.Simvastatin. 13.Citalopram. 14.Fexofenadine p.r.n. 15.Butalbital p.r.n. 16.Diazepam p.r.n.  PAST MEDICAL HISTORY:  History of migraines, vertigo, anxiety, depression, past history of bronchitis, past history of pneumonia, hypercholesterolemia, hiatal hernia, reflux disease, history of hemorrhoids, urinary incontinence, hypothyroidism, history of renal calculi, fibromyalgia, postmenopausal, childhood illnesses of measles.  PAST SURGICAL HISTORY:  Left knee meniscal repair, right foot surgery x2, left foot surgery, cataract left eye in May 2009, right eye cataract March 2005 and again in December 2007, right eye macular repair in 2004, a hemorrhoidectomy in 1994,  gallbladder surgery in 1985, hysterectomy in 1978, appendectomy in 1960 and a tonsillectomy in 1949.  FAMILY HISTORY:  Family history for breast cancer, diabetes with mother and grandfather and an uncle with brain tumor.  SOCIAL HISTORY:  Married.  Retired Catering manager.  Past smoker.  Very seldom intake of alcohol.  She does want to look into a rehabilitation facility [facility].  She does have a living will and healthcare power of attorney.  REVIEW OF SYSTEMS:  GENERAL:  No fevers, chills, or night sweats. NEUROLOGIC:  No seizures, syncope or paralysis.  RESPIRATORY:  She gets a little shortness of breath on exertion, but no shortness of breath at rest.  No productive cough or hemoptysis.  CARDIOVASCULAR:  No chest pain, no orthopnea.  GASTROINTESTINAL:  Intermittent diarrhea.  No nausea or vomiting.  No constipation.  GENITOURINARY:  A little bit of frequency and nocturia.  No dysuria or hematuria.  MUSCULOSKELETAL: Knee pain.  PHYSICAL EXAMINATION:  VITAL SIGNS:  Pulse 64, respiratory rate 12, blood pressure 128/78. GENERAL:  A 70 year old white female, well-nourished, well-developed, short-statured, overweight, no acute distress.  She is alert and oriented and cooperative, very pleasant.  Accompanied by her husband. HEENT:  Normocephalic, atraumatic.  Pupils are equal, round and reactive.  EOMs intact. NECK:  Supple.  No carotid bruits. CHEST:  Clear anterior and posterior chest walls.  No rhonchi, rales or wheezing. HEART:  Regular  rate and rhythm without murmur.  S1-S2 noted. ABDOMEN:  Soft, round, protuberant abdomen.  Bowel sounds present. RECTAL/BREASTS/GENITALIA:  Not done as not pertinent to present illness. EXTREMITIES:  Left knee no effusion.  Varus malalignment deformity. Range of motion 5-120.  Marked crepitus.  Tender more medial than lateral.  No instability.  IMPRESSION:  Osteoarthritis of the left knee.  PLAN:  The patient was admitted to [facility]  and will undergo a left total-knee replacement arthroplasty.  The surgery will be performed by Dr. Ollen Gross.     Alexzandrew L. Perkins, P.A.C.     ALP/MEDQ  D:  03/15/2011  T:  03/15/2011  Job:  [id number]  Electronically Signed by Patrica Duel P.A.C. on 03/18/2011 10:09:19 AM Electronically Signed by Ollen Gross M.D. on 03/22/2011 06:47:53 PM

## 2011-03-22 NOTE — Discharge Summary (Signed)
NAMEAMEN, DARGIS NO.:  [id number]  MEDICAL RECORD NO.:  [id number]           PATIENT TYPE:  I  LOCATION:  1611                         FACILITY:  [facility]  PHYSICIAN:  Ollen Gross, M.D.    DATE OF BIRTH:  [birth year]  DATE OF ADMISSION:  03/15/2011 DATE OF DISCHARGE:  03/18/2011                        DISCHARGE SUMMARY - REFERRING   ADMITTING DIAGNOSES: 1. Left greater than right knee pain from osteoarthritis. 2. History of migraines. 3. Vertigo. 4. Anxiety. 5. Depression. 6. Past history of bronchitis. 7. Past history of pneumonia. 8. Hypercholesterolemia. 9. Hiatal hernia. 10.Reflux disease. 11.History of hemorrhoids. 12.History of urinary incontinence. 13.Hypothyroidism. 14.History of renal calculi. 15.Postmenopausal. 16.Fibromyalgia. 17.Childhood illnesses of measles.  DISCHARGE DIAGNOSES: 1. Osteoarthritis of the left knee status post left total-knee     replacement arthroplasty. 2. Mild postoperative acute blood loss anemia that did not require     transfusion. 3. History of migraines. 4. Vertigo. 5. Anxiety. 6. Depression. 7. Past history of bronchitis. 8. Past history of pneumonia. 9. Hypercholesterolemia. 10.Hiatal hernia. 11.Reflux disease. 12.History of hemorrhoids. 13.History of urinary incontinence. 14.Hypothyroidism. 15.History of renal calculi. 16.Postmenopausal. 17.Fibromyalgia. 18.Childhood illnesses of measles.  PROCEDURE:  March 15, 2011, left total-knee.  SURGEON:  Ollen Gross, M.D.  ASSISTANT:  Rozell Searing PA-C.  ANESTHESIA:  General.  TOURNIQUET TIME:  37 minutes.  CONSULTATIONS:  None.  BRIEF HISTORY:  Jean Nash is a 70 year old female with advanced arthritis of the left greater than right knee with progressive worsening pain and dysfunction.  She has failed nonoperative management and now presents for a total-knee arthroplasty.  LABORATORY DATA:  Preoperative CBC showed a hemoglobin of  14.4, hematocrit of 43.8, white cell count 8.3, platelets 231.  PT/INR 13.1 and 0.97 with a PTT of 27.  Chemistry panel on admission all within normal limits.  Preoperative UA was negative.  Blood group type A positive.  Nasal swabs were negative for Staphylococcus aureus and negative for MRSA.  Serial CBCs were followed.  Hemoglobin did drop to 10.8 and then 9.4.  Last noted H and H was 8.7 and 27.1.  She was asymptomatic.  Placed her on iron.  Serial BMETs were followed for 48 hours.  Glucose went up from 108 to 129.  Last noted at 132.  Remaining electrolytes remained within normal limits.  HOSPITAL COURSE:  The patient was admitted to [facility], taken to the OR, and underwent the above-stated procedure without complication.  The patient tolerated the procedure well and was later transferred to the recovery room on the orthopedic floor.  She was started on p.o. and IV analgesics for pain control following surgery and given 24 hours postoperative IV antibiotics.  She was started on Xarelto for DVT prophylaxis.  She was given 24 hours of antibiotics.  She had low urinary output on day one and did require some extra fluids.  She had a fair amount of pain through the night.  She was encouraged to take p.o.'s with IV supplementation and she was started back on all of her home medications.  She did want to look into [facility] nursing facility so we got the social  work involved right away.  By day two, her pain was under a little bit better control.  She was getting up walking about 30 to 50 feet.  We changed the dressing and the incision looked good.  Hemoglobin was down to 9.4 at that point.  She was asymptomatic with this so we just watched her and put her on iron supplementation. By this time her urinary output had picked up and she was diuresing fluids well.  Dressing was change and incision looked good.  No signs of infection.  She was seen on the morning of day three.   It was confirmed that a bed was available over at [facility].  She was seen in rounds by Dr. Lequita Halt.  No complaints.  Started to progress with therapy.  She was transferred over at that time.  DISCHARGE PLAN:  The patient was transferred to [facility] on Mar 18, 2011.  DISCHARGE DIAGNOSES:  Please see above.  DISCHARGE MEDICATIONS:  Current medications at the time of transfer include: 1. Xarelto 10 mg p.o. daily for 2 weeks, then discontinue the Xarelto. 2. Colace 100 mg p.o. b.i.d. 3. Vesicare 5 mg one-half tablet daily. 4. Citalopram 10 mg p.o. every morning. 5. Zocor 10 mg p.o. daily at bedtime. 6. Lyrica 75 mg p.o. at bedtime. 7. Nexium 40 mg p.o. q.a.m. 8. Tylenol 325 one or two every 4-6 hours as needed for mild pain,     temperature or headache. 9. Robaxin 500 mg p.o. q.6-8 hours p.r.n. spasm. 10.OxyIR 5 mg one or two tablets every 4-6 hours as needed for     moderate pain. 11.Claritin 10 mg p.o. daily p.r.n. 12.Restoril 15-30 mg p.o. q.h.s. p.r.n. insomnia.  DIET:  Heart-healthy.  ACTIVITY:  She is weightbearing as tolerated.  Total-knee protocol. Continue gait training, ambulation, ADLs, range of motion and strengthening exercises.  Please note, she may start showering. However, do not submerge the incision under water.  FOLLOWUP:  She needs to follow up either on Tuesday, May 15 or Thursday, May 17 with Dr. Lequita Halt.  Please contact the office at [phone] to help arrange an appointment for followup care of this patient.  DISPOSITION:  [facility].  CONDITION ON DISCHARGE:  Improving.     Alexzandrew L. Julien Girt, P.A.C.   ______________________________ Ollen Gross, M.D.    ALP/MEDQ  D:  03/18/2011  T:  03/18/2011  Job:  [id number]  cc:   Elnita Maxwell, [place]  Electronically Signed by Patrica Duel P.A.C. on 03/18/2011 10:10:12 AM Electronically Signed by Ollen Gross M.D. on 03/22/2011 06:47:58 PM

## 2011-03-26 ENCOUNTER — Telehealth: Payer: Self-pay | Admitting: Internal Medicine

## 2011-03-26 NOTE — Telephone Encounter (Signed)
She is home from rehab after knee surgery.  Should she restart her levothyroxide 50 mcg.

## 2011-03-28 NOTE — Telephone Encounter (Signed)
Yes

## 2011-03-29 NOTE — Telephone Encounter (Signed)
Call placed to patient she was informed per Dr Yoo instructions 

## 2011-03-30 ENCOUNTER — Other Ambulatory Visit: Payer: Self-pay | Admitting: *Deleted

## 2011-03-30 DIAGNOSIS — R32 Unspecified urinary incontinence: Secondary | ICD-10-CM

## 2011-03-30 MED ORDER — SOLIFENACIN SUCCINATE 5 MG PO TABS: ORAL_TABLET | ORAL | Status: DC

## 2011-03-30 NOTE — Discharge Summary (Signed)
NAMEKEBRINA, FRIEND              ACCOUNT NO.:  [id number]   MEDICAL RECORD NO.:  [id number]          PATIENT TYPE:  INP   LOCATION:  1430                         FACILITY:  [facility]   PHYSICIAN:  Rosalyn Gess. Norins, MD  DATE OF BIRTH:  22-Jan-1941   DATE OF ADMISSION:  01/21/2009  DATE OF DISCHARGE:  01/22/2009                               DISCHARGE SUMMARY   ADMITTING DIAGNOSIS:  Chest pain at the back and leg; rule out  myocardial infarction.   DISCHARGE DIAGNOSIS:  Myocardial infarction ruled out.   CONSULTANTS:  None.   PROCEDURES:  CT angio which revealed no pulmonary embolus.  Lungs  thought to be normal except for question of atelectatic changes of the  left lung base.   HISTORY OF PRESENT ILLNESS:  The patient is a 70 year old woman with  diabetes and fibromyalgia.  She has been taking care of her elderly  father here at the hospital.  On the day of admission, she had a prickly-  like sensation and a pain in the left side of her back below the  shoulder blade, and there was a sensation that spread underneath her  left breast, as well as going to behind her left ear.  The patient's  discomfort was relieved by morphine after the patient presented to the  emergency department.  At the time of admission exam, she was pain free.  She was subsequently admitted on 24-hour evaluation to rule out for MI.  Please see the H&P for past medical history, family history and social  history.   HOSPITAL COURSE:  The patient was admitted to a telemetry unit.  She had  negative cardiac enzymes while in the emergency department.  Cardiac  panel x2 have returned, both being normal.  The patient's other  laboratory included normal liver function studies.  Hemoglobin was 12.3  grams, white count 7200.  Chemistries were unremarkable with a blood  sugar of 97, creatinine 0.8.  Cholesterol was 144, HDL was 28, LDL was  88.   PHYSICAL EXAMINATION:  VITAL SIGNS:  Examination revealed the patient  to  be afebrile, blood pressure 100/76, heart rate 59, respirations 16, O2  saturations 100% on 2 liters.  CBG was 96.  GENERAL APPEARANCE:  This is an overweight Caucasian woman in bed in no  acute distress.  HEENT:  Unremarkable.  CHEST:  The patient is moving air well.  CARDIOVASCULAR:  2+ radial pulse.  Precordium was quiet.  She had a  regular rate and rhythm.   With the patient having ruled out by cardiac enzymes with her pain being  resolved, with a CT angiogram being negative, with vital signs being  stable, she is thought to be ready for discharge home.   DISPOSITION:  The patient will be discharged to home.  She will follow  up with Dr. Artist Pais, her primary care physician.  He will be notified of her  discharge and they, Dr. Artist Pais and the patient, will decide about whether  she needs to have risk stratification with nuclear stress testing.   DISCHARGE MEDICATIONS:  Patient is to resume all her  home medications  including:  1  Advair 150 one inhalation b.i.d.  1. Nexium 40 mg daily.  2. Levothyroxine 50 mg daily.  3. Diclofenac 50 mg b.i.d.  4. Metformin 500 mg b.i.d.  5. Calcium 500 mg daily.  6. Lyrica 75 mg q.h.s.  7. Citalopram 20 mg q.h.s.  8. Allegra 180 mg q.h.s.  9. Simvastatin 40 mg q.h.s.  10.Diazepam 1.25 to 2.5 q.8h. p.r.n.  11.Multivitamin.  12.Probiotic acidophilus 75 mg daily.  13.Tessalon Perles 100 mg t.i.d. p.r.n.  14.Acetaminophen 1000 mg t.i.d. p.r.n.  15.Ranitidine 150 mg q.h.s. p.r.n.  16.Lomotil 20 mcg p.r.n.  17.Ambien CR 12.5 mg q.h.s. p.r.n.  18.Avelox 400 mg p.r.n. as directed.  19.Meperidine 50 mg q.6h. p.r.n.   CONDITION AT TIME OF DISCHARGE:  The patient is stable and improved with  MI ruled out.      Rosalyn Gess Norins, MD  Electronically Signed     MEN/MEDQ  D:  01/22/2009  T:  01/22/2009  Job:  [id number]   cc:   Barbette Hair. [place], DO  [address] [place], [place] [address]

## 2011-03-30 NOTE — Telephone Encounter (Signed)
Patients husband called and left voice message stating they checked with [facility] and the Rx has not been received for the Vesicare. He states patient is requesting a refill on the medication.   Call was placed to [facility] [phone], spoke with pharmacist Geoffery Spruce, she was provided a verbal order for the vesicar Rx.

## 2011-03-30 NOTE — H&P (Signed)
Jean Nash, Jean Nash              ACCOUNT NO.:  [id number]   MEDICAL RECORD NO.:  [id number]          PATIENT TYPE:  INP   LOCATION:  1430                         [facility]ACILITY[facility]  [facility]   PHYSICIAN:  Michiel Cowboy, MDDATE OF BIRTH:  1941-05-08   DATE OF ADMISSION:  01/21/2009  DATE OF DISCHARGE:                              HISTORY & PHYSICAL   PRIMARY CARE PHYSICIAN:  Barbette Hair. Artist Pais, DO with [place].   CHIEF COMPLAINT:  Back, chest and leg pain.   HISTORY OF PRESENT ILLNESS:  The patient is a 70 year old female with  history of diabetes and fibromyalgia.  She has recently been taking care  of her elderly father who is in the hospital with pneumonia, been  sleeping in the hospital here at [facility].  Today, she noticed a  prickly-like sensation and her left side of her back, kind of below the  shoulderblade. Then, there was a sensation that spread underneath her  left breast as well as going into behind her left ear as well as maybe  slightly into her left leg.  The prickling sensation that was not  necessarily tingling, it was more pain.  It was finally relieved with  morphine once the patient presented to the emergency department.  Currently, she is completely chest pain free.  She also reports that she  was having trouble breathing with of a breathing being somewhat  uncomfortable during this episode as well.  She had had a CT scan  angiogram done to rule out PE which was negative.  Her D-dimer was also  unremarkable.  At which point, [facility] hospitalist was called to admit for  [place].  The patient was brought in from [facility] ED to [facility].   REVIEW OF SYSTEMS:  The patient has been feeling fairly well and  currently only feels somewhat tired.  No coughing.  No fevers, no  chills.  No abdominal pain.   REVIEW OF SYSTEMS:  Otherwise, unremarkable.   PAST MEDICAL HISTORY:  1. Fibromyalgia.  2. Diabetes.  3. Chronic left shoulder pain.  4. Remote history  of pneumonia.  5. Mild depression.  6. Tonsillectomy.  7. Mild asthma.   SOCIAL HISTORY:  The patient used to smoke as a teenager but quit.  Does  not abuse drugs.  Lives at home.  Does not drink alcohol.   FAMILY HISTORY:  Significant for breast cancer in the family.  No  coronary artery disease early in the family.   ALLERGIES:  ALLERGIC TO NICKEL BUT NO MEDICATIONS.   MEDICATIONS:  1. Advair 100/50 inhaled twice a day.  2. __________40 mg daily.  3. Levothyroxine 50 mg daily.  4. Diclofenac 50 mg twice a day.  5. Metformin 500 mg twice a day.  6. Calcium 500 mg with meals.  7. Lyrica 75 at bedtime.  8. Citalopram 20 mg at bedtime.  9. Allegra 180 at bedtime.  10.Simvastatin 40 at bedtime.  11.Diazepam 1.25 or 2.5 mg as needed for anxiety or fear of flying.  12.Probiotic acidophilus 75 mcg daily.  13.Acetaminophen, Tessalon Perles, ranitidine, Ambien, meperidine as  needed.   PHYSICAL EXAMINATION:  VITAL SIGNS:  Temperature an 97.5, blood pressure  136/50, heart rate 63, respirations 18, satting 94% on room air.  The  patient appears to be slightly obese but in no acute distress.  Per  patient, her initial O2 saturation was 88% on room air, but that is not  documented in the chart.  HEENT:  Nontraumatic.  Moist mucous membranes.  LUNGS:  Clear to auscultation bilaterally, good air movement.  HEART:  Regular rate and rhythm.  No murmurs, rubs or gallops.  ABDOMEN:  Obese but nontender, nondistended.  LOWER EXTREMITY:  Without clubbing, cyanosis or edema.  NEUROLOGIC:  Intact.  Strength 5/5 in all four extremities.  Cranial  nerves intact.   LABORATORY DATA:  White blood cell count 8.5, hemoglobin 13.3, sodium  141, potassium 4.0, creatinine 0.7, D-dimer less than 0.22.  Chest x-ray  showing low lung volumes, but otherwise unremarkable.  Calcium 9.1.  CT  angiogram showing no PE.  EKG showing heart rate 74, normal sinus  rhythm.  No evidence of ischemia  infarction.   ASSESSMENT/PLAN:  1. Chest pain, extremely typical.  She does have risk factors of      diabetes.  Will cycle cardiac enzymes, observe in telemetry, check      hemoglobin A1c, fasting lipid panel, check TSH.  2. Diabetes.  Will hold metformin, do sliding scale insulin.  3. Fibromyalgia.  Continue Lyrica.  I wonder if her pain is related to      fibromyalgia.  4. History of asthma.  No wheezing currently, but continue Advair and      p.r.n. albuterol as needed.  5. Prophylaxis.  Protonix plus SCDs.   Dr. Rene Paci will assume care in the morning.      Michiel Cowboy, MD  Electronically Signed     AVD/MEDQ  D:  01/21/2009  T:  01/22/2009  Job:  [id number]   cc:   Barbette Hair. [place], DO  [address] [place], [place] [address]

## 2011-04-05 ENCOUNTER — Ambulatory Visit: Payer: Medicare Other | Admitting: Internal Medicine

## 2011-04-06 ENCOUNTER — Ambulatory Visit (INDEPENDENT_AMBULATORY_CARE_PROVIDER_SITE_OTHER): Payer: Medicare Other | Admitting: Family

## 2011-04-06 ENCOUNTER — Encounter: Payer: Self-pay | Admitting: Family

## 2011-04-06 ENCOUNTER — Ambulatory Visit: Payer: Medicare Other | Admitting: Internal Medicine

## 2011-04-06 VITALS — BP 106/76 | HR 84 | Temp 98.1°F | Resp 16 | Ht 60.0 in | Wt 204.1 lb

## 2011-04-06 DIAGNOSIS — R7309 Other abnormal glucose: Secondary | ICD-10-CM

## 2011-04-06 DIAGNOSIS — K5900 Constipation, unspecified: Secondary | ICD-10-CM | POA: Insufficient documentation

## 2011-04-06 DIAGNOSIS — D62 Acute posthemorrhagic anemia: Secondary | ICD-10-CM

## 2011-04-06 DIAGNOSIS — Z96659 Presence of unspecified artificial knee joint: Secondary | ICD-10-CM | POA: Insufficient documentation

## 2011-04-06 LAB — CBC
Hemoglobin: 11.9 g/dL — ABNORMAL LOW (ref 12.0–15.0)
MCH: 28.6 pg (ref 26.0–34.0)
Platelets: 340 10*3/uL (ref 150–400)
RBC: 4.16 MIL/uL (ref 3.87–5.11)
WBC: 7.1 10*3/uL (ref 4.0–10.5)

## 2011-04-06 NOTE — Assessment & Plan Note (Signed)
Check hemoglobin today, if normal we'll plan to discontinue iron supplement as this is worsening her constipation.

## 2011-04-06 NOTE — Patient Instructions (Signed)
Complete your blood work on the first floor today. Follow up with Dr. Artist Pais in 2 months- sooner if problems or concerns.

## 2011-04-06 NOTE — Assessment & Plan Note (Signed)
Her postoperative course appears to have been uncomplicated. She continues home health physical therapy, with plans to start outpatient physical therapy next week. Pain medications are being managed by Dr. Antony Odea. She is currently holding off on the Mobic. Will defer pain management to orthopedics.

## 2011-04-06 NOTE — Progress Notes (Signed)
Subjective:    [name], female    DOB: [birth year], 70 y.o.   MRN: [id number]  HPI  Patient is a 70 year old female who presents today following a left total knee replacement on 03/15/2011. This was performed by Dr. Despina Hick.  Her hospitalization was uncomplicated, she did require transfusion for acute blood loss anemia. Hospital records were reviewed. She was then discharged from the hospital and sent to [facility] rehabilitation center.  Returned home from [facility] 10 days ago.  Was not happy with the care at [facility].  She is completing therapy at home.  She continues to have complaints about postoperative pain. She also complains about pain in her left shoulder.  Constipation-the patient complains of ongoing constipation despite using Colace 100 mg by mouth 3 times a day. Review of Systems  Constitutional: Negative for fever.  Musculoskeletal: Positive for joint swelling and arthralgias.  denies drainage or erythema from left knee  Past Medical History  Diagnosis Date  . Chest pain, atypical     Negative stress test 02/2009  . Other and unspecified hyperlipidemia   . Personal history of urinary calculi   . Pain in joint, shoulder region   . Irritable bowel syndrome   . Chronic insomnia   . GERD (gastroesophageal reflux disease)   . History of colonic polyps   . Asthma   . Fibromyalgia   . Hypothyroidism   . Other abnormal glucose   . Stress incontinence, female   . Macular degeneration   . Cluster headaches     history of migraines  . Meniscus tear     history of right knee torn meniscus    History   Social History  . Marital Status: Married    Spouse Name: N/A    Number of Children: N/A  . Years of Education: N/A   Occupational History  . Not on file.   Social History Main Topics  . Smoking status: Never Smoker   . Smokeless tobacco: Not on file  . Alcohol Use: No  . Drug Use: Not on file  . Sexually Active: Not on file   Other  Topics Concern  . 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 Appendectomy   . Cholecystectomy   . Abdominal hysterectomy   . Tonsillectomy   . Eye surgery     to repair macular hole  . Cataract extraction, bilateral   . Knee arthroscopy     > 10 years ago  . Joint replacement 03/15/11    left knee replacement    Family History  Problem Relation Age of Onset  . Stroke Mother     died age 5  . Diabetes Mother     No Known Allergies  Current Outpatient Prescriptions on File Prior to Visit  Medication Sig Dispense Refill  . citalopram (CELEXA) 20 MG tablet Take 20 mg by mouth daily.        Marland Kitchen docusate sodium (STOOL SOFTENER) 100 MG capsule Take 100 mg by mouth 3 (three) times daily as needed.        Marland Kitchen esomeprazole (NEXIUM) 40 MG capsule Take 40 mg by mouth 2 (two) times daily.        Marland Kitchen levothyroxine (SYNTHROID, LEVOTHROID) 50 MCG tablet Take 1 tablet (50 mcg total) by mouth daily.  90 tablet  1  . pregabalin (LYRICA) 75 MG capsule 75 mg 2 (two) times daily. Take 2 tablets daily.      . simvastatin (ZOCOR) 10 MG tablet Take 10 mg by mouth at bedtime.        . solifenacin (VESICARE) 5 MG tablet 1/2 tab once daily  45 tablet  1  . calcium carbonate (OS-CAL) 600 MG TABS Take 600 mg by mouth 2 (two) times daily with a meal.        . Coenzyme Q10 150 MG CAPS Take 150 mg by mouth daily.        . Multiple Vitamin (MULTIVITAMIN PO) Take by mouth daily.        Marland Kitchen omega-3 acid ethyl esters (LOVAZA) 1 G capsule Take 2 g by mouth 2 (two) times daily.        . polycarbophil (FIBERCON) 625 MG tablet Take 625 mg by mouth daily.        . Probiotic Product (ACIDOPHILUS PROBIOTIC BLEND) CAPS Take by mouth daily.        Marland Kitchen DISCONTD: acetaminophen-codeine (TYLENOL #3) 300-30 MG per tablet Take 1-2 tablets by mouth every 4 (four) hours as needed.        Marland Kitchen  DISCONTD: butalbital-aspirin-caffeine (BUTALBITAL COMPOUND/ASA) 50-325-40 MG per tablet Take 1 tablet by mouth every 6 (six) hours as needed.        Marland Kitchen DISCONTD: meloxicam (MOBIC) 15 MG tablet Take 15 mg by mouth daily.          BP 106/76  Pulse 84  Temp(Src) 98.1 F (36.7 C) (Oral)  Resp 16  Ht 5' (1.524 m)  Wt 204 lb 1.3 oz (92.57 kg)  BMI 39.86 kg/m2        Objective:   Physical Exam General: Awake, alert, seated in chair, in no acute distress Cardiovascular: S1, S2, regular rate and rhythm Respiratory: Breath sounds are clear to auscultation bilaterally without wheezes rales or rhonchi Extremities: No pedal edema is noted Musculoskeletal: She is noted to have some appropriate postoperative swelling of the left knee without associated erythema. Incision is well approximated, clean, dry and intact. She no longer has staples.       Assessment & Plan:

## 2011-04-06 NOTE — Assessment & Plan Note (Signed)
I have recommended that she continue her stool softener, and  resume her constipation is likely being exacerbated by her iron supplement and her narcotics. hopefully as she is able to wean back on her narcotic pain medication, her constipation will improve.

## 2011-04-07 ENCOUNTER — Telehealth: Payer: Self-pay | Admitting: Family

## 2011-04-07 NOTE — Telephone Encounter (Signed)
Spoke with pt's husband as pt was asleep. Notified him per Melissa's instructions.

## 2011-04-07 NOTE — Telephone Encounter (Signed)
Please call patient and let her know that her blood count is almost back to normal.  It is ok for her to stop the iron supplement.

## 2011-04-15 ENCOUNTER — Telehealth: Payer: Self-pay | Admitting: *Deleted

## 2011-04-15 MED ORDER — POLYETHYLENE GLYCOL 3350 17 GM/SCOOP PO POWD: 17.0000 g | Freq: Every day | ORAL | Status: AC

## 2011-04-15 NOTE — Telephone Encounter (Signed)
OK to continue stool softener.  I would also recommend miralax 17grams once daily in water or juice.  This is OTC.

## 2011-04-15 NOTE — Telephone Encounter (Signed)
Pt currently on Oxycodone and hydrocodone s/p knee surgery and will be on these for some time during her recovery. Pt taking stool softener 3 times daily but is not helping with constipation. Pt administered an enema with relief but wants to know what to do differently for future relief of constipation while on these pain meds. Pharmacist recommended Dulcolax to pt.  Please advise.

## 2011-04-16 ENCOUNTER — Telehealth: Payer: Self-pay | Admitting: Internal Medicine

## 2011-04-16 MED ORDER — MELOXICAM 7.5 MG PO TABS: ORAL_TABLET | ORAL | Status: DC

## 2011-04-16 NOTE — Telephone Encounter (Signed)
Call placed to [phone], patients was advised per [facility]'s instructions. She stated that she is not able to tolerate liquid form of stool softner so she is planning to take pill form

## 2011-04-16 NOTE — Telephone Encounter (Signed)
Pt husband larry states that pt is not taking hyrdrocodone anymore due to side effect(severe constipation). Pt would like to know if she can go back on mobic?

## 2011-04-16 NOTE — Telephone Encounter (Signed)
OK to start meloxicam, rx sent to [facility].  She should only use it as needed however.

## 2011-04-16 NOTE — Telephone Encounter (Signed)
Call placed to patient at [phone], no answer. A detailed voice message was left informing patient per [facility]'s instructions. Message was left informing patient she would get a call back regarding the change in pain medication.

## 2011-05-05 ENCOUNTER — Telehealth: Payer: Self-pay | Admitting: Internal Medicine

## 2011-05-05 ENCOUNTER — Ambulatory Visit: Payer: Medicare Other | Admitting: Internal Medicine

## 2011-05-05 NOTE — Telephone Encounter (Signed)
SIMVASTATIN 10 MG TAKE 1 PER DAY QTY 90  LYRICA 75 MG TAKE 2 PER DAY QTY OF 180 (KNEE SURGEON CHANGED THE AMOUNT) CITALOPRAM 20MG  1 PER DAY QTY 90  DR [facility] CHANGED Karielle PAIN MED TO MELOXICAM 15 MG SHE TAKES 1 PER DAY  SHE WILL BE OUT SOON.  SHE IS NOT SCHEDULED TO GO BACK TO [facility]  WHO FILLS THIS RX.   [facility]

## 2011-05-05 NOTE — Telephone Encounter (Signed)
Lyrica- she can have #60 no refills. OK to give 90 day supply of simvastatin and citalopram.  Meloxicam should come from Dr. Despina Hick.

## 2011-05-11 MED ORDER — CITALOPRAM HYDROBROMIDE 20 MG PO TABS: 20.0000 mg | ORAL_TABLET | Freq: Every day | ORAL | Status: DC

## 2011-05-11 MED ORDER — SIMVASTATIN 10 MG PO TABS: 10.0000 mg | ORAL_TABLET | Freq: Every day | ORAL | Status: DC

## 2011-05-11 NOTE — Telephone Encounter (Signed)
Rx for Citalopram and Simvastatin sent to pharmacy

## 2011-05-11 NOTE — Telephone Encounter (Signed)
Call placed to patient at [phone], no answer. A detailed voice message was left for patient to return call.

## 2011-05-12 NOTE — Telephone Encounter (Signed)
No return call from patient regarding status of medication request

## 2011-05-14 ENCOUNTER — Telehealth: Payer: Self-pay | Admitting: Internal Medicine

## 2011-05-14 MED ORDER — MELOXICAM 7.5 MG PO TABS: ORAL_TABLET | ORAL | Status: DC

## 2011-05-14 NOTE — Telephone Encounter (Signed)
90 day is OK with 0rf, Meloxicam 15mg  daily with food, make sure she understands that this med can cause GI upset and that it is closely related to Ibuprofen and Naproxen so she cannot take any other versions of those at same time

## 2011-05-14 NOTE — Telephone Encounter (Signed)
Call placed to patient at  [phone], no answer. A detailed voice message was left informing patient per Dr Abner Greenspan instruction

## 2011-05-14 NOTE — Telephone Encounter (Signed)
The PA at Dr alusio's office gave Jean Nash an rx for Meloxicam 15 mg.  Jean Nash has been released from Dr Despina Hick.  She needs a new rx for this med sent to [facility] (used to be [facility]) She has enough for 5 or 6 days.  Please advise.   This is the only nsaid she is taking.

## 2011-06-04 ENCOUNTER — Encounter: Payer: Self-pay | Admitting: Internal Medicine

## 2011-06-04 ENCOUNTER — Ambulatory Visit (INDEPENDENT_AMBULATORY_CARE_PROVIDER_SITE_OTHER): Payer: Medicare Other | Admitting: Internal Medicine

## 2011-06-04 ENCOUNTER — Ambulatory Visit: Payer: Medicare Other | Admitting: Internal Medicine

## 2011-06-04 VITALS — BP 114/72 | HR 68 | Temp 97.6°F | Wt 192.4 lb

## 2011-06-04 DIAGNOSIS — R7309 Other abnormal glucose: Secondary | ICD-10-CM

## 2011-06-04 DIAGNOSIS — D229 Melanocytic nevi, unspecified: Secondary | ICD-10-CM

## 2011-06-04 DIAGNOSIS — E785 Hyperlipidemia, unspecified: Secondary | ICD-10-CM

## 2011-06-04 DIAGNOSIS — G4700 Insomnia, unspecified: Secondary | ICD-10-CM

## 2011-06-04 DIAGNOSIS — M25569 Pain in unspecified knee: Secondary | ICD-10-CM

## 2011-06-04 DIAGNOSIS — D239 Other benign neoplasm of skin, unspecified: Secondary | ICD-10-CM

## 2011-06-04 DIAGNOSIS — E039 Hypothyroidism, unspecified: Secondary | ICD-10-CM

## 2011-06-04 MED ORDER — TEMAZEPAM 15 MG PO CAPS: 15.0000 mg | ORAL_CAPSULE | Freq: Every evening | ORAL | Status: DC | PRN

## 2011-06-05 DIAGNOSIS — D229 Melanocytic nevi, unspecified: Secondary | ICD-10-CM | POA: Insufficient documentation

## 2011-06-05 NOTE — Assessment & Plan Note (Signed)
Dermatology referral per patient request 

## 2011-06-05 NOTE — Assessment & Plan Note (Signed)
Stable. Refill Restoril. Patient aware possible addictive effects

## 2011-06-05 NOTE — Progress Notes (Signed)
  Subjective:    [name], female    DOB: 1941-10-20, 70 y.o.   MRN: [id number]  HPI Patient presents to clinic for evaluation of multiple medical problems. History diabetes mellitus past good control. Currently seeing endocrinology who recommended beginning metformin. Suffers from chronic insomnia and requests refill of Restoril. No evidence of addiction or tolerance. Status post left TKR and is stable. Ambulates without assistance. Attempting to lose weight and is down 12 pounds since last visit. Tolerate statin therapy without myalgias or abnormal LFTs. Request dermatology referral for skin check. No concerning changing lesions. No other complaints  Reviewed past medical history, medications and allergies   Review of Systems see history of present illness     Objective:   Physical Exam Physical Exam  Vitals reviewed. Constitutional:  appears well-developed and well-nourished. No distress.  HENT:  Head: Normocephalic and atraumatic.  Nose: Nose normal.  Mouth/Throat: Oropharynx is clear and moist. No oropharyngeal exudate.  Eyes: Conjunctivae and EOM are normal. Pupils are equal, round, and reactive to light. Right eye exhibits no discharge. Left eye exhibits no discharge. No scleral icterus.  Neck: Neck supple. No thyromegaly present. No carotid bruits Cardiovascular: Normal rate, regular rhythm and normal heart sounds.  Exam reveals no gallop and no friction rub.   No murmur heard. Pulmonary/Chest: Effort normal and breath sounds normal. No respiratory distress.  has no wheezes.  has no rales.  Lymphadenopathy:   no cervical adenopathy.  Neurological:  is alert.  Skin: Skin is warm and dry.  not diaphoretic. No suspicious skin lesions Psychiatric: normal mood and affect.         Assessment & Plan:

## 2011-06-05 NOTE — Assessment & Plan Note (Signed)
Tolerating statin therapy. Obtain fasting lipid profile and liver function tests

## 2011-06-05 NOTE — Assessment & Plan Note (Signed)
Status post TKR. Improving.

## 2011-06-05 NOTE — Assessment & Plan Note (Signed)
Beginning metformin per endocrinology recommendations. Obtain an A1c and urine microalbumin

## 2011-06-05 NOTE — Assessment & Plan Note (Signed)
Obtain TSH °

## 2011-06-07 ENCOUNTER — Other Ambulatory Visit: Payer: Self-pay | Admitting: Internal Medicine

## 2011-06-07 ENCOUNTER — Telehealth: Payer: Self-pay | Admitting: *Deleted

## 2011-06-07 ENCOUNTER — Telehealth: Payer: Self-pay | Admitting: Internal Medicine

## 2011-06-07 DIAGNOSIS — E119 Type 2 diabetes mellitus without complications: Secondary | ICD-10-CM

## 2011-06-07 DIAGNOSIS — Z1231 Encounter for screening mammogram for malignant neoplasm of breast: Secondary | ICD-10-CM

## 2011-06-07 DIAGNOSIS — E039 Hypothyroidism, unspecified: Secondary | ICD-10-CM

## 2011-06-07 DIAGNOSIS — E785 Hyperlipidemia, unspecified: Secondary | ICD-10-CM

## 2011-06-07 NOTE — Telephone Encounter (Signed)
Is it okay to refill this medication for patient? 

## 2011-06-07 NOTE — Telephone Encounter (Signed)
Patient needs refill on lyrica 75mg  qty 90. Please send through [facility].

## 2011-06-07 NOTE — Telephone Encounter (Signed)
Pt was instructed to return to the lab fasting this week. Orders entered and forwarded to the lab.

## 2011-06-08 MED ORDER — PREGABALIN 75 MG PO CAPS: 75.0000 mg | ORAL_CAPSULE | Freq: Two times a day (BID) | ORAL | Status: DC

## 2011-06-08 NOTE — Telephone Encounter (Signed)
Rx printed for signature. 

## 2011-06-08 NOTE — Telephone Encounter (Signed)
yes

## 2011-06-09 NOTE — Telephone Encounter (Signed)
Rx signed and  faxed to [phone].

## 2011-06-22 ENCOUNTER — Ambulatory Visit (HOSPITAL_BASED_OUTPATIENT_CLINIC_OR_DEPARTMENT_OTHER)
Admission: RE | Admit: 2011-06-22 | Discharge: 2011-06-22 | Disposition: A | Discharge status: Home / Self Care | Payer: Medicare Other | Source: Ambulatory Visit | Attending: Internal Medicine | Admitting: Internal Medicine

## 2011-06-22 ENCOUNTER — Telehealth: Payer: Self-pay | Admitting: Internal Medicine

## 2011-06-22 DIAGNOSIS — Z1231 Encounter for screening mammogram for malignant neoplasm of breast: Secondary | ICD-10-CM

## 2011-06-22 NOTE — Telephone Encounter (Signed)
She has labs done at Dr Daune Perch office.  Dr Sharl Ma is to send you the results.  She would like you totell her if she should re start her Metformin.  She has a white bag under each eye and wonders if the simvastatin dose is large enough.  She was here for post hospital and then had blood work on 7-23 and she has not gotten her results.

## 2011-06-29 ENCOUNTER — Encounter: Payer: Self-pay | Admitting: Internal Medicine

## 2011-06-29 NOTE — Telephone Encounter (Signed)
This encounter was created in error - please disregard.

## 2011-06-29 NOTE — Telephone Encounter (Signed)
Call placed to patient.she was asked about the blood work. She stated that she has the blood work done at Dr Daune Perch office and they was to have sent a copy of results. She was informed the results of the lab would need to be reviewed with Dr Sharl Ma and the questions regarding the Metformin on whether she is to re-start the medication.  She stated the Metformin was to be used for the side effects from testosterone issue.   Call was placed to lab regarding the lab results from 06/07/2011. Lateefa with [facility] stated the lab order was transmitted, however the specimen was never received. Called placed to Katrina with [facility]; and she does not recall drawing labs on patient. There is not signature on check in sheet for patient presenting the lab for the day in question. Katrina was unable to confirm if patient had blood drawn.  Call returned to patient [phone], she stated that she had blood on 7/20 and then followed up with Dr Sharl Ma. She stated he informed her that he would draw what was not already drawn. Patient was advised to have Dr Sharl Ma office forward copy of blood work before labs are re-drawn. Patient verbalized understanding and agrees.

## 2011-06-30 ENCOUNTER — Ambulatory Visit: Payer: Medicare Other | Admitting: Internal Medicine

## 2011-07-02 LAB — HEPATIC FUNCTION PANEL
ALT: 19 U/L (ref 0–35)
AST: 15 U/L (ref 0–37)
Bilirubin, Direct: 0.2 mg/dL (ref 0.0–0.3)

## 2011-07-02 LAB — LIPID PANEL
Cholesterol: 190 mg/dL (ref 0–200)
HDL: 42 mg/dL (ref >39)
Total CHOL/HDL Ratio: 4.5 Ratio

## 2011-07-02 LAB — HEMOGLOBIN A1C
Hgb A1c MFr Bld: 6.1 % — ABNORMAL HIGH (ref <5.7)
Mean Plasma Glucose: 128 mg/dL — ABNORMAL HIGH (ref <117)

## 2011-07-02 LAB — TSH: TSH: 1.609 u[IU]/mL (ref 0.350–4.500)

## 2011-07-02 LAB — MICROALBUMIN / CREATININE URINE RATIO
Creatinine, Urine: 40.2 mg/dL
Microalb Creat Ratio: 12.4 mg/g (ref 0.0–30.0)
Microalb, Ur: 0.5 mg/dL (ref 0.00–1.89)

## 2011-07-05 ENCOUNTER — Ambulatory Visit (INDEPENDENT_AMBULATORY_CARE_PROVIDER_SITE_OTHER): Payer: Medicare Other | Admitting: Internal Medicine

## 2011-07-05 ENCOUNTER — Encounter: Payer: Self-pay | Admitting: Internal Medicine

## 2011-07-05 VITALS — BP 126/80 | HR 92 | Temp 98.3°F | Ht 59.5 in | Wt 188.0 lb

## 2011-07-05 DIAGNOSIS — G4700 Insomnia, unspecified: Secondary | ICD-10-CM

## 2011-07-05 DIAGNOSIS — H21309 Idiopathic cysts of iris, ciliary body or anterior chamber, unspecified eye: Secondary | ICD-10-CM

## 2011-07-05 DIAGNOSIS — L680 Hirsutism: Secondary | ICD-10-CM

## 2011-07-05 DIAGNOSIS — E785 Hyperlipidemia, unspecified: Secondary | ICD-10-CM

## 2011-07-05 DIAGNOSIS — N3281 Overactive bladder: Secondary | ICD-10-CM

## 2011-07-05 DIAGNOSIS — N318 Other neuromuscular dysfunction of bladder: Secondary | ICD-10-CM

## 2011-07-05 DIAGNOSIS — R7309 Other abnormal glucose: Secondary | ICD-10-CM

## 2011-07-05 DIAGNOSIS — R32 Unspecified urinary incontinence: Secondary | ICD-10-CM

## 2011-07-05 DIAGNOSIS — E039 Hypothyroidism, unspecified: Secondary | ICD-10-CM

## 2011-07-05 MED ORDER — SOLIFENACIN SUCCINATE 5 MG PO TABS: 5.0000 mg | ORAL_TABLET | Freq: Every day | ORAL | Status: DC

## 2011-07-05 MED ORDER — SIMVASTATIN 10 MG PO TABS: 10.0000 mg | ORAL_TABLET | Freq: Every day | ORAL | Status: DC

## 2011-07-05 MED ORDER — PREGABALIN 75 MG PO CAPS: 75.0000 mg | ORAL_CAPSULE | Freq: Two times a day (BID) | ORAL | Status: DC

## 2011-07-05 MED ORDER — MELOXICAM 7.5 MG PO TABS: ORAL_TABLET | ORAL | Status: DC

## 2011-07-05 MED ORDER — TEMAZEPAM 15 MG PO CAPS: 15.0000 mg | ORAL_CAPSULE | Freq: Every evening | ORAL | Status: DC | PRN

## 2011-07-05 MED ORDER — LEVOTHYROXINE SODIUM 50 MCG PO TABS: 50.0000 ug | ORAL_TABLET | Freq: Every day | ORAL | Status: DC

## 2011-07-05 NOTE — Progress Notes (Signed)
Subjective:    [name], female    DOB: Mar 05, 1941, 70 y.o.   MRN: [id number]  HPI  69 year old white female with history of hirsutism, hypothyroidism, borderline type 2 diabetes her followup.  Interval history: Patient seen by endocrinologist Dr. Sharl Ma for hirsutism.  Workup included dexamethasone suppression test. Prolactin level was normal at 4.8. DHEA sulfate level 69.9. An androstenedione level normal at 38. Her symptoms were presumed secondary to PCOS.    Osteoarthritis of bilateral knees. She successfully underwent a left total knee replacement by Dr. Despina Hick.  She had some issues with pain control and was taking oxycodone regularly for several weeks. She suddenly stopped which led to withdrawal symptoms. Patient also experienced significant constipation with use of narcotics. This has resolved with using stool softeners and fiber supplements.  She still has some significant right knee arthritis for which she takes meloxicam and tramadol. She is planning to have right knee replacement at some point once physical therapy for left knee completed and strength returns to normal.  Hypothyroidism-recent TSH was within normal limits.  Review of Systems Mild weight loss,  Chronic insomnia unchanged.  Right elbow cyst becoming more bothersome - no significant change in size       Past Medical History  Diagnosis Date  . Chest pain, atypical     Negative stress test 02/2009  . Other and unspecified hyperlipidemia   . Personal history of urinary calculi   . Pain in joint, shoulder region   . Irritable bowel syndrome   . Chronic insomnia   . GERD (gastroesophageal reflux disease)   . History of colonic polyps   . Asthma   . Fibromyalgia   . Hypothyroidism   . Other abnormal glucose   . Stress incontinence, female   . Macular degeneration   . Cluster headaches     history of migraines  . Meniscus tear     history of right knee torn meniscus    History   Social  History  . Marital Status: Married    Spouse Name: N/A    Number of Children: N/A  . Years of Education: N/A   Occupational History  . Not on file.   Social History Main Topics  . Smoking status: Never Smoker   . Smokeless tobacco: Not on file  . Alcohol Use: No  . Drug Use: Not on file  . Sexually Active: Not on file   Other Topics Concern  . 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 Appendectomy   . Cholecystectomy   . Abdominal hysterectomy   . Tonsillectomy   . Eye surgery     to repair macular hole  . Cataract extraction, bilateral   . Knee arthroscopy     > 10 years ago  . Joint replacement 03/15/11    left knee replacement    Family History  Problem Relation Age of Onset  . Stroke Mother     died age 58  . Diabetes Mother     No Known Allergies  Current Outpatient Prescriptions on File Prior to Visit  Medication Sig Dispense Refill  . butalbital-acetaminophen-caffeine (FIORICET WITH CODEINE) 50-325-40-30 MG per capsule Take 1 capsule by mouth every 4 (four) hours as needed.        . calcium carbonate (OS-CAL)  600 MG TABS Take 600 mg by mouth 2 (two) times daily with a meal.        . citalopram (CELEXA) 20 MG tablet Take 1 tablet (20 mg total) by mouth daily.  90 tablet  1  . Coenzyme Q10 150 MG CAPS Take 150 mg by mouth daily.        . diazepam (VALIUM) 5 MG tablet Take 5 mg by mouth every 6 (six) hours as needed.        . docusate sodium (STOOL SOFTENER) 100 MG capsule Take 100 mg by mouth 3 (three) times daily as needed.        Marland Kitchen esomeprazole (NEXIUM) 40 MG capsule Take 40 mg by mouth 2 (two) times daily.        . Multiple Vitamin (MULTIVITAMIN PO) Take by mouth daily.        Marland Kitchen omega-3 acid ethyl esters (LOVAZA) 1 G capsule Take 2 g by mouth 2 (two) times daily.        . polycarbophil (FIBERCON) 625 MG tablet  Take 625 mg by mouth daily.        . Probiotic Product (ACIDOPHILUS PROBIOTIC BLEND) CAPS Take by mouth daily.        . fexofenadine (ALLEGRA) 180 MG tablet Take 180 mg by mouth daily.        . methocarbamol (ROBAXIN) 500 MG tablet Take 500 mg by mouth. Every 6-8 hours as needed for muscle spasm.       . Oxycodone HCl 10 MG TABS Take 1-2 tablets by mouth. Every 4 to 6 hours as needed for pain.       Marland Kitchen oxyCODONE-acetaminophen (PERCOCET) 5-325 MG per tablet Take 1-2 tablets by mouth. Every 4-6 hours as needed for pain.         BP 126/80  Pulse 92  Temp(Src) 98.3 F (36.8 C) (Oral)  Ht 4' 11.5" (1.511 m)  Wt 188 lb (85.276 kg)  BMI 37.34 kg/m2    Objective:   Physical Exam  Constitutional: Pleasant, no distress  Head: Normocephalic and atraumatic.  Neck: Normal range of motion. Neck supple. No thyromegaly present. No carotid bruit Cardiovascular: Normal rate, regular rhythm and normal heart sounds.  Exam reveals no gallop and no friction rub.   No murmur heard. Pulmonary/Chest: Effort normal and breath sounds normal.  No wheezes. No rales.  Neurological: Alert. No cranial nerve deficit.  Skin: Skin is warm and dry.  left total knee scar well healed ,  Right elbow - 1 cm cyst,  slightly tender  Psychiatric: Normal mood and affect. Behavior is normal.      Assessment & Plan:

## 2011-07-05 NOTE — Assessment & Plan Note (Signed)
Patient elected to continue her current dose of VESIcare 5 mg one half tab daily. She would like to avoid constipation and dry mouth side effects.

## 2011-07-05 NOTE — Patient Instructions (Signed)
Please complete the following labs 1 week before your next follow up appointment BMET - 790.29 A1c - 790.29 FLP - 272.4

## 2011-07-05 NOTE — Assessment & Plan Note (Signed)
Unchanged.  Continue temazepam as needed.  She understands not to abruptly stop her medication.

## 2011-07-05 NOTE — Assessment & Plan Note (Addendum)
She has yet to start metformin therapy. I agree with endocrinology her using metformin 500 mg twice a day.  We will monitor her A1c Q3 to 4 months. She is to continue her current exercise regimen. Sure he understands the importance of low carbohydrate diet.    She is continuing her weight loss efforts.

## 2011-07-05 NOTE — Assessment & Plan Note (Signed)
Continue current dose of levothyroxine.  Monitor TSH every 6 months  Lab Results  Component Value Date   TSH 1.609 07/01/2011

## 2011-07-05 NOTE — Assessment & Plan Note (Addendum)
Patient seen by endocrinologist Dr. Sharl Ma for hirsutism.  Workup included dexamethasone suppression test. Prolactin level was normal at 4.8. DHEA sulfate level 69.9. An androstenedione level normal at 38. Her symptoms were presumed secondary to PCOS.    Use metformin 500 mg twice a day

## 2011-07-05 NOTE — Assessment & Plan Note (Signed)
Her LDL has worsened.  I recommend goal LDL of less than 100. I suggest patient continue her current dose of simvastatin 10 mg. She is consuming significant amount of red meat.  Instead of increasing simvastatin,  patient is to decrease her intake of saturated fats.  Plan to repeat fasting lipid profile in 3 months.

## 2011-07-05 NOTE — Assessment & Plan Note (Signed)
Cyst is becoming more bothersome.  No change in size. She is not quite ready for surgical referral but we will refer her to Dr. Josephine Igo when she is ready.

## 2011-07-06 ENCOUNTER — Telehealth: Payer: Self-pay | Admitting: *Deleted

## 2011-07-06 NOTE — Telephone Encounter (Signed)
Pt's husband called insurance to see if the zostavax was covered by his insurance.  They will pay all but $35 if it is a written rx for them to take [facility].  Pt wants to know if you can write a rx and they will pick up tomorrow.

## 2011-07-07 MED ORDER — ZOSTER VACCINE LIVE 19400 UNT/0.65ML ~~LOC~~ SOLR: 0.6500 mL | Freq: Once | SUBCUTANEOUS | Status: DC

## 2011-07-07 NOTE — Telephone Encounter (Signed)
rx printed and signed

## 2011-07-07 NOTE — Telephone Encounter (Signed)
rx ready for pick up and patient is aware  

## 2011-07-09 ENCOUNTER — Telehealth: Payer: Self-pay | Admitting: Internal Medicine

## 2011-07-09 NOTE — Telephone Encounter (Signed)
Pharmacy needs clarification on dosage there are 2 different doses on prescription.

## 2011-07-12 ENCOUNTER — Telehealth: Payer: Self-pay | Admitting: Internal Medicine

## 2011-07-12 DIAGNOSIS — R32 Unspecified urinary incontinence: Secondary | ICD-10-CM

## 2011-07-12 MED ORDER — SOLIFENACIN SUCCINATE 5 MG PO TABS: 5.0000 mg | ORAL_TABLET | Freq: Every day | ORAL | Status: DC

## 2011-07-12 NOTE — Telephone Encounter (Signed)
Pharmacy needs clarification on prescription for solifenacin (VESICARE) 5 MG tablet

## 2011-07-12 NOTE — Telephone Encounter (Signed)
Should be once daily, called pharmacy and gave correct directions

## 2011-07-12 NOTE — Telephone Encounter (Signed)
Duplicate note

## 2011-07-21 ENCOUNTER — Other Ambulatory Visit: Payer: Self-pay | Admitting: Dermatology

## 2011-08-23 ENCOUNTER — Telehealth: Payer: Self-pay | Admitting: Internal Medicine

## 2011-08-23 NOTE — Telephone Encounter (Signed)
Pt requesting refill on temazepam (RESTORIL) 15 MG capsule please send to [facility]  Pt was also prescribed Lamisil cream for the skin on her head and is currently using medicated shampoo but is not working. Pt is requesting  Refill if possible or if she needs to come in and see a doctor she will. Pt is going out of town on Thursday for 3 weeks and is hoping to get something before then. Send to [facility]

## 2011-08-27 NOTE — Telephone Encounter (Signed)
Ok for refill for restoril for mail order.  I do not think the creams are going for work for rash on her scalp.  She may need to take oral antifungal medication.

## 2011-08-30 MED ORDER — TEMAZEPAM 15 MG PO CAPS: 15.0000 mg | ORAL_CAPSULE | Freq: Every evening | ORAL | Status: DC | PRN

## 2011-08-30 NOTE — Telephone Encounter (Signed)
Sent rx in electronically, l/m on pts machine

## 2011-09-29 ENCOUNTER — Other Ambulatory Visit (INDEPENDENT_AMBULATORY_CARE_PROVIDER_SITE_OTHER): Payer: Medicare Other

## 2011-09-29 DIAGNOSIS — I10 Essential (primary) hypertension: Secondary | ICD-10-CM

## 2011-09-29 DIAGNOSIS — IMO0001 Reserved for inherently not codable concepts without codable children: Secondary | ICD-10-CM

## 2011-09-29 DIAGNOSIS — D649 Anemia, unspecified: Secondary | ICD-10-CM

## 2011-09-29 LAB — BASIC METABOLIC PANEL
BUN: 15 mg/dL (ref 6–23)
Chloride: 101 mEq/L (ref 96–112)
Creatinine, Ser: 0.6 mg/dL (ref 0.4–1.2)
GFR: 105.52 mL/min (ref >60.00)
Glucose, Bld: 89 mg/dL (ref 70–99)

## 2011-09-29 LAB — CBC WITH DIFFERENTIAL/PLATELET
Basophils Absolute: 0 10*3/uL (ref 0.0–0.1)
Eosinophils Relative: 2.1 % (ref 0.0–5.0)
MCV: 88.3 fl (ref 78.0–100.0)
Monocytes Absolute: 0.6 10*3/uL (ref 0.1–1.0)
Neutrophils Relative %: 62.2 % (ref 43.0–77.0)
Platelets: 203 10*3/uL (ref 150.0–400.0)
RDW: 15.1 % — ABNORMAL HIGH (ref 11.5–14.6)
WBC: 9.5 10*3/uL (ref 4.5–10.5)

## 2011-09-29 LAB — HEMOGLOBIN A1C: Hgb A1c MFr Bld: 6 % (ref 4.6–6.5)

## 2011-10-04 ENCOUNTER — Ambulatory Visit (INDEPENDENT_AMBULATORY_CARE_PROVIDER_SITE_OTHER): Payer: Medicare Other | Admitting: Internal Medicine

## 2011-10-04 ENCOUNTER — Ambulatory Visit: Payer: Medicare Other | Admitting: Internal Medicine

## 2011-10-04 DIAGNOSIS — IMO0001 Reserved for inherently not codable concepts without codable children: Secondary | ICD-10-CM

## 2011-10-04 DIAGNOSIS — M79671 Pain in right foot: Secondary | ICD-10-CM

## 2011-10-04 DIAGNOSIS — M79609 Pain in unspecified limb: Secondary | ICD-10-CM

## 2011-10-04 DIAGNOSIS — N318 Other neuromuscular dysfunction of bladder: Secondary | ICD-10-CM

## 2011-10-04 DIAGNOSIS — L680 Hirsutism: Secondary | ICD-10-CM

## 2011-10-04 DIAGNOSIS — N3281 Overactive bladder: Secondary | ICD-10-CM

## 2011-10-04 DIAGNOSIS — H21309 Idiopathic cysts of iris, ciliary body or anterior chamber, unspecified eye: Secondary | ICD-10-CM

## 2011-10-04 DIAGNOSIS — B350 Tinea barbae and tinea capitis: Secondary | ICD-10-CM | POA: Insufficient documentation

## 2011-10-04 DIAGNOSIS — R7309 Other abnormal glucose: Secondary | ICD-10-CM

## 2011-10-04 MED ORDER — GRISEOFULVIN MICROSIZE 500 MG PO TABS: 500.0000 mg | ORAL_TABLET | Freq: Every day | ORAL | Status: AC

## 2011-10-04 NOTE — Assessment & Plan Note (Signed)
Patient with recurrent tinea capitis along its scalp line near neck. Treat with griseofulvin 500 mg x4 weeks.

## 2011-10-04 NOTE — Assessment & Plan Note (Signed)
She still complains of occasional urgency esp at night.  Trial of 10 mg of vesicare.

## 2011-10-04 NOTE — Patient Instructions (Signed)
Take Lyrica dose at bedtime Taper temazepam dose as directed Continue exercise program as directed Try taking vesicare 10 mg daily x 1 week Please complete the following lab tests before your next follow up appointment: BMET, A1c - 790.29

## 2011-10-04 NOTE — Assessment & Plan Note (Addendum)
Stable.  I encouraged regular exercise. Continue metformin 500 mg twice a day.   Lab Results  Component Value Date   HGBA1C 6.0 09/29/2011

## 2011-10-04 NOTE — Progress Notes (Signed)
Subjective:    [name], female    DOB: [birth year], 70 y.o.   MRN: [id number]  HPI  70 year old white female with history of hirsutism, polycystic ovarian syndrome, chronic insomnia for followup. Overall patient has been doing very well. She has responded nicely to metformin 500 mg twice daily. She feels her course facial hair has improved.  Since having her 68left knee replacement she has been more active. Her A1c has slightly improved to 6.0.  Patient currently taking Lyrica for fibromyalgia. She is taking dose in the morning which causes excessive somnolence.  Patient complains of persistent tinea capitis near hairline back of her neck. She responded well to Lamisil last year but her rash has recurred.  Patient has a cyst on right elbow. It is more bothersome than usual. It causes pain when she rests her elbow to brush her teeth in the morning she requests referral to surgeon for excision.  Patient also complains of right foot pain. She was seen by her podiatrist but would like a second opinion from orthopedic specialist.  Review of Systems Negative for chest pain,   Negative for significant weight change  Past Medical History  Diagnosis Date  . Chest pain, atypical     Negative stress test 02/2009  . Other and unspecified hyperlipidemia   . Personal history of urinary calculi   . Pain in joint, shoulder region   . Irritable bowel syndrome   . Chronic insomnia   . GERD (gastroesophageal reflux disease)   . History of colonic polyps   . Asthma   . Fibromyalgia   . Hypothyroidism   . Other abnormal glucose   . Stress incontinence, female   . Macular degeneration   . Cluster headaches     history of migraines  . Meniscus tear     history of right knee torn meniscus    History   Social History  . Marital Status: Married    Spouse Name: N/A    Number of Children: N/A  . Years of Education: N/A   Occupational History  . Not on file.   Social History  Main Topics  . Smoking status: Never Smoker   . Smokeless tobacco: Not on file  . Alcohol Use: No  . Drug Use: Not on file  . Sexually Active: Not on file   Other Topics Concern  . 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 Appendectomy   . Cholecystectomy   . Abdominal hysterectomy   . Tonsillectomy   . Eye surgery     to repair macular hole  . Cataract extraction, bilateral   . Knee arthroscopy     > 10 years ago  . Joint replacement 03/15/11    70left knee replacement    Family History  Problem Relation Age of Onset  . Stroke Mother     died age 56  . Diabetes Mother     No Known Allergies  Current Outpatient Prescriptions on File Prior to Visit  Medication Sig Dispense Refill  . butalbital-acetaminophen-caffeine (FIORICET WITH CODEINE) 50-325-40-30 MG per capsule Take 1 capsule by mouth every 4 (four) hours as needed.        . calcium carbonate (OS-CAL) 600 MG TABS Take 600 mg by mouth 2 (two) times daily with a meal.        .  Coenzyme Q10 150 MG CAPS Take 150 mg by mouth daily.        . diazepam (VALIUM) 5 MG tablet Take 5 mg by mouth every 6 (six) hours as needed.        . docusate sodium (STOOL SOFTENER) 100 MG capsule Take 100 mg by mouth 3 (three) times daily as needed.        Marland Kitchen esomeprazole (NEXIUM) 40 MG capsule Take 40 mg by mouth 2 (two) times daily.        . fexofenadine (ALLEGRA) 180 MG tablet Take 180 mg by mouth daily.        Marland Kitchen levothyroxine (SYNTHROID, LEVOTHROID) 50 MCG tablet Take 1 tablet (50 mcg total) by mouth daily.  90 tablet  1  . meloxicam (MOBIC) 7.5 MG tablet One tablet by mouth once daily only as needed for pain.  90 tablet  1  . metFORMIN (GLUCOPHAGE) 500 MG tablet Take 1 tablet (500 mg total) by mouth 2 (two) times daily with a meal.  180 tablet  1  . methocarbamol (ROBAXIN) 500 MG tablet Take 500 mg  by mouth. Every 6-8 hours as needed for muscle spasm.       . Multiple Vitamin (MULTIVITAMIN PO) Take by mouth daily.        Marland Kitchen omega-3 acid ethyl esters (LOVAZA) 1 G capsule Take 2 g by mouth 2 (two) times daily.        . polycarbophil (FIBERCON) 625 MG tablet Take 625 mg by mouth daily.        . pregabalin (LYRICA) 75 MG capsule Take 1 capsule (75 mg total) by mouth 2 (two) times daily.  180 capsule  1  . Probiotic Product (ACIDOPHILUS PROBIOTIC BLEND) CAPS Take by mouth daily.        . simvastatin (ZOCOR) 10 MG tablet Take 1 tablet (10 mg total) by mouth at bedtime.  90 tablet  1  . solifenacin (VESICARE) 5 MG tablet Take 1 tablet (5 mg total) by mouth daily.  90 tablet  1  . temazepam (RESTORIL) 15 MG capsule Take 1 capsule (15 mg total) by mouth at bedtime as needed.  90 capsule  0  . traMADol (ULTRAM) 50 MG tablet Take 1 tablet by mouth Twice daily.        BP 114/82  Pulse 76  Temp(Src) 98.3 F (36.8 C) (Oral)  Wt 186 lb (84.369 kg)       Objective:   Physical Exam  Constitutional: Pleasant, no distress  Head: Normocephalic and atraumatic.  Neck: Normal range of motion. Neck supple. No thyromegaly present. No carotid bruit Cardiovascular: Normal rate, regular rhythm and normal heart sounds. Exam reveals no gallop and no friction rub.  No murmur heard.  Pulmonary/Chest: Effort normal and breath sounds normal. No wheezes. No rales.  Neurological: Alert. No cranial nerve deficit.  Skin: Skin is warm and dry. left total knee scar well healed , Right elbow - 1 cm cyst, slightly tender Scaly, slightly erythematous annular rash along the scalp line near neck.  Psychiatric: Normal mood and affect. Behavior is normal.      Assessment & Plan:

## 2011-10-04 NOTE — Assessment & Plan Note (Signed)
Pt seen by podiatrist.  She reportedly has significant degenerative disease.  No improvement with steroid injection.  She would like second opinion from ortho.   Refer to Dr. Victorino Dike

## 2011-10-04 NOTE — Assessment & Plan Note (Signed)
Improved with metformin.  Continue current dose of 500 mg bid.

## 2011-10-04 NOTE — Assessment & Plan Note (Signed)
Patient advised to take Lyrica dose at bedtime.  She is experiencing somnolence with AM dosing.

## 2011-10-06 ENCOUNTER — Other Ambulatory Visit: Payer: Self-pay | Admitting: *Deleted

## 2011-10-06 NOTE — Telephone Encounter (Signed)
Ok to refill temazepam x 3

## 2011-10-06 NOTE — Telephone Encounter (Signed)
Pt has an appt for 12/30/11 and would like a refill on temazepam

## 2011-10-11 MED ORDER — DIAZEPAM 5 MG PO TABS: 5.0000 mg | ORAL_TABLET | Freq: Four times a day (QID) | ORAL | Status: DC | PRN

## 2011-10-11 NOTE — Telephone Encounter (Signed)
rx called in, pt aware 

## 2011-10-19 ENCOUNTER — Other Ambulatory Visit: Payer: Self-pay | Admitting: Orthopedic Surgery

## 2011-11-02 ENCOUNTER — Telehealth: Payer: Self-pay | Admitting: *Deleted

## 2011-11-02 ENCOUNTER — Encounter (HOSPITAL_BASED_OUTPATIENT_CLINIC_OR_DEPARTMENT_OTHER): Payer: Self-pay | Admitting: *Deleted

## 2011-11-02 DIAGNOSIS — R32 Unspecified urinary incontinence: Secondary | ICD-10-CM

## 2011-11-02 MED ORDER — OMEGA-3-ACID ETHYL ESTERS 1 G PO CAPS: 2.0000 g | ORAL_CAPSULE | Freq: Two times a day (BID) | ORAL | Status: DC

## 2011-11-02 MED ORDER — METFORMIN HCL 500 MG PO TABS: 500.0000 mg | ORAL_TABLET | Freq: Two times a day (BID) | ORAL | Status: DC

## 2011-11-02 MED ORDER — CITALOPRAM HYDROBROMIDE 20 MG PO TABS: 20.0000 mg | ORAL_TABLET | Freq: Every day | ORAL | Status: DC

## 2011-11-02 MED ORDER — ESOMEPRAZOLE MAGNESIUM 40 MG PO CPDR: 40.0000 mg | DELAYED_RELEASE_CAPSULE | Freq: Two times a day (BID) | ORAL | Status: DC

## 2011-11-02 NOTE — Telephone Encounter (Signed)
Pt needs a refill Lyrica 75 mg once daily and Temazepam 15 mg qhs

## 2011-11-02 NOTE — Progress Notes (Signed)
Pt had lt total knee 4/12-had workup prior-stress test normal, copy printed Could not find a recent ekg-will need new one and bmet-put on metformin to lower testosterone levels-does have a1c ck-last 6

## 2011-11-03 ENCOUNTER — Encounter (HOSPITAL_BASED_OUTPATIENT_CLINIC_OR_DEPARTMENT_OTHER)
Admission: RE | Admit: 2011-11-03 | Discharge: 2011-11-03 | Disposition: A | Discharge status: Home / Self Care | Payer: Medicare Other | Source: Ambulatory Visit | Attending: Orthopedic Surgery | Admitting: Orthopedic Surgery

## 2011-11-03 ENCOUNTER — Other Ambulatory Visit: Payer: Self-pay

## 2011-11-03 LAB — BASIC METABOLIC PANEL
BUN: 18 mg/dL (ref 6–23)
CO2: 25 mEq/L (ref 19–32)
GFR calc non Af Amer: 84 mL/min — ABNORMAL LOW (ref >90)
Glucose, Bld: 99 mg/dL (ref 70–99)
Potassium: 4.7 mEq/L (ref 3.5–5.1)
Sodium: 139 mEq/L (ref 135–145)

## 2011-11-03 MED ORDER — PREGABALIN 75 MG PO CAPS: 75.0000 mg | ORAL_CAPSULE | Freq: Two times a day (BID) | ORAL | Status: DC

## 2011-11-03 MED ORDER — TEMAZEPAM 15 MG PO CAPS: 15.0000 mg | ORAL_CAPSULE | Freq: Every evening | ORAL | Status: DC | PRN

## 2011-11-03 NOTE — Telephone Encounter (Signed)
rxs faxed

## 2011-11-03 NOTE — Telephone Encounter (Signed)
Ok to refill x 3 for both meds

## 2011-11-04 ENCOUNTER — Encounter (HOSPITAL_BASED_OUTPATIENT_CLINIC_OR_DEPARTMENT_OTHER): Payer: Self-pay | Admitting: Anesthesiology

## 2011-11-04 ENCOUNTER — Ambulatory Visit (HOSPITAL_BASED_OUTPATIENT_CLINIC_OR_DEPARTMENT_OTHER)
Admission: RE | Admit: 2011-11-04 | Discharge: 2011-11-04 | Disposition: A | Discharge status: Home / Self Care | Payer: Medicare Other | Source: Ambulatory Visit | Attending: Orthopedic Surgery | Admitting: Orthopedic Surgery

## 2011-11-04 ENCOUNTER — Encounter (HOSPITAL_BASED_OUTPATIENT_CLINIC_OR_DEPARTMENT_OTHER)
Admission: RE | Disposition: A | Discharge status: Home / Self Care | Payer: Self-pay | Source: Ambulatory Visit | Attending: Orthopedic Surgery

## 2011-11-04 ENCOUNTER — Encounter (HOSPITAL_BASED_OUTPATIENT_CLINIC_OR_DEPARTMENT_OTHER): Payer: Self-pay | Admitting: Orthopedic Surgery

## 2011-11-04 ENCOUNTER — Ambulatory Visit (HOSPITAL_BASED_OUTPATIENT_CLINIC_OR_DEPARTMENT_OTHER): Payer: Medicare Other | Admitting: Anesthesiology

## 2011-11-04 ENCOUNTER — Other Ambulatory Visit: Payer: Self-pay | Admitting: Orthopedic Surgery

## 2011-11-04 ENCOUNTER — Encounter (HOSPITAL_BASED_OUTPATIENT_CLINIC_OR_DEPARTMENT_OTHER): Payer: Self-pay

## 2011-11-04 DIAGNOSIS — D1809 Hemangioma of other sites: Secondary | ICD-10-CM | POA: Insufficient documentation

## 2011-11-04 DIAGNOSIS — Z01810 Encounter for preprocedural cardiovascular examination: Secondary | ICD-10-CM | POA: Insufficient documentation

## 2011-11-04 DIAGNOSIS — K219 Gastro-esophageal reflux disease without esophagitis: Secondary | ICD-10-CM | POA: Insufficient documentation

## 2011-11-04 DIAGNOSIS — E119 Type 2 diabetes mellitus without complications: Secondary | ICD-10-CM | POA: Insufficient documentation

## 2011-11-04 DIAGNOSIS — Z01812 Encounter for preprocedural laboratory examination: Secondary | ICD-10-CM | POA: Insufficient documentation

## 2011-11-04 DIAGNOSIS — J45909 Unspecified asthma, uncomplicated: Secondary | ICD-10-CM | POA: Insufficient documentation

## 2011-11-04 DIAGNOSIS — E039 Hypothyroidism, unspecified: Secondary | ICD-10-CM | POA: Insufficient documentation

## 2011-11-04 HISTORY — DX: Unspecified osteoarthritis, unspecified site: M19.90

## 2011-11-04 HISTORY — DX: Calculus of kidney: N20.0

## 2011-11-04 HISTORY — PX: MASS EXCISION: SHX2000

## 2011-11-04 LAB — POCT HEMOGLOBIN-HEMACUE: Hemoglobin: 14.8 g/dL (ref 12.0–15.0)

## 2011-11-04 SURGERY — EXCISION MASS
Anesthesia: Monitor Anesthesia Care | Site: Arm Lower | Laterality: Right | Wound class: Clean

## 2011-11-04 MED ORDER — ONDANSETRON HCL 4 MG/2ML IJ SOLN
INTRAMUSCULAR | Status: DC | PRN
  Administered 2011-11-04: 4 mg via INTRAVENOUS

## 2011-11-04 MED ORDER — CEFAZOLIN SODIUM 1-5 GM-% IV SOLN
1.0000 g | INTRAVENOUS | Status: AC
  Administered 2011-11-04: 2 g via INTRAVENOUS

## 2011-11-04 MED ORDER — LIDOCAINE HCL (PF) 2 % IJ SOLN
INTRAMUSCULAR | Status: DC | PRN
  Administered 2011-11-04: 3 mL

## 2011-11-04 MED ORDER — LACTATED RINGERS IV SOLN
INTRAVENOUS | Status: DC
  Administered 2011-11-04: 09:00:00 via INTRAVENOUS

## 2011-11-04 MED ORDER — PROPOFOL 10 MG/ML IV EMUL
INTRAVENOUS | Status: DC | PRN
  Administered 2011-11-04: 50 ug/kg/min via INTRAVENOUS

## 2011-11-04 MED ORDER — LIDOCAINE HCL (CARDIAC) 20 MG/ML IV SOLN
INTRAVENOUS | Status: DC | PRN
  Administered 2011-11-04: 25 mg via INTRAVENOUS

## 2011-11-04 MED ORDER — HYDROCODONE-ACETAMINOPHEN 5-325 MG PO TABS: ORAL_TABLET | ORAL | Status: AC

## 2011-11-04 MED ORDER — FENTANYL CITRATE 0.05 MG/ML IJ SOLN
INTRAMUSCULAR | Status: DC | PRN
  Administered 2011-11-04: 100 ug via INTRAVENOUS

## 2011-11-04 MED ORDER — CHLORHEXIDINE GLUCONATE 4 % EX LIQD: 60.0000 mL | Freq: Once | CUTANEOUS | Status: DC

## 2011-11-04 SURGICAL SUPPLY — 53 items
BANDAGE ADHESIVE 1X3 (GAUZE/BANDAGES/DRESSINGS) IMPLANT
BANDAGE ELASTIC 3 VELCRO ST LF (GAUZE/BANDAGES/DRESSINGS) ×1 IMPLANT
BLADE MINI RND TIP GREEN BEAV (BLADE) IMPLANT
BLADE SURG 15 STRL LF DISP TIS (BLADE) ×1 IMPLANT
BLADE SURG 15 STRL SS (BLADE) ×2
BNDG CMPR 9X4 STRL LF SNTH (GAUZE/BANDAGES/DRESSINGS)
BNDG CMPR MD 5X2 ELC HKLP STRL (GAUZE/BANDAGES/DRESSINGS)
BNDG COHESIVE 1X5 TAN STRL LF (GAUZE/BANDAGES/DRESSINGS) ×2 IMPLANT
BNDG ELASTIC 2 VLCR STRL LF (GAUZE/BANDAGES/DRESSINGS) IMPLANT
BNDG ESMARK 4X9 LF (GAUZE/BANDAGES/DRESSINGS) IMPLANT
BRUSH SCRUB EZ PLAIN DRY (MISCELLANEOUS) ×2 IMPLANT
CLOSURE STERI STRIP 1/2 X4 (GAUZE/BANDAGES/DRESSINGS) ×1 IMPLANT
CLOTH BEACON ORANGE TIMEOUT ST (SAFETY) ×2 IMPLANT
CORDS BIPOLAR (ELECTRODE) IMPLANT
COVER MAYO STAND STRL (DRAPES) ×2 IMPLANT
COVER TABLE BACK 60X90 (DRAPES) ×2 IMPLANT
CUFF TOURNIQUET SINGLE 18IN (TOURNIQUET CUFF) IMPLANT
DECANTER SPIKE VIAL GLASS SM (MISCELLANEOUS) IMPLANT
DRAIN PENROSE 1/2X12 LTX STRL (WOUND CARE) IMPLANT
DRAIN PENROSE 1/4X12 LTX STRL (WOUND CARE) IMPLANT
DRAPE EXTREMITY T 121X128X90 (DRAPE) ×2 IMPLANT
DRAPE SURG 17X23 STRL (DRAPES) ×2 IMPLANT
DRSG TEGADERM 2-3/8X2-3/4 SM (GAUZE/BANDAGES/DRESSINGS) ×1 IMPLANT
GAUZE XEROFORM 1X8 LF (GAUZE/BANDAGES/DRESSINGS) IMPLANT
GLOVE BIOGEL PI IND STRL 8 (GLOVE) ×1 IMPLANT
GLOVE BIOGEL PI INDICATOR 8 (GLOVE) ×1
GLOVE ORTHO TXT STRL SZ7.5 (GLOVE) ×2 IMPLANT
GOWN PREVENTION PLUS XLARGE (GOWN DISPOSABLE) ×2 IMPLANT
GOWN STRL REIN XL XLG (GOWN DISPOSABLE) ×4 IMPLANT
NDL BLUNT 17GA (NEEDLE) ×1 IMPLANT
NEEDLE 27GAX1X1/2 (NEEDLE) IMPLANT
NEEDLE BLUNT 17GA (NEEDLE) ×2 IMPLANT
PACK BASIN DAY SURGERY FS (CUSTOM PROCEDURE TRAY) ×2 IMPLANT
PAD CAST 3X4 CTTN HI CHSV (CAST SUPPLIES) IMPLANT
PADDING CAST COTTON 3X4 STRL (CAST SUPPLIES)
PADDING UNDERCAST 2  STERILE (CAST SUPPLIES) IMPLANT
SPLINT PLASTER CAST XFAST 3X15 (CAST SUPPLIES) IMPLANT
SPLINT PLASTER XTRA FASTSET 3X (CAST SUPPLIES)
SPONGE GAUZE 4X4 12PLY (GAUZE/BANDAGES/DRESSINGS) IMPLANT
SPONGE GAUZE 4X4 16PLY NONSTR (GAUZE/BANDAGES/DRESSINGS) ×1 IMPLANT
STOCKINETTE 4X48 STRL (DRAPES) ×2 IMPLANT
STRIP CLOSURE SKIN 1/2X4 (GAUZE/BANDAGES/DRESSINGS) IMPLANT
SUT ETHILON 5 0 P 3 18 (SUTURE) ×1
SUT MERSILENE 4 0 P 3 (SUTURE) IMPLANT
SUT NYLON ETHILON 5-0 P-3 1X18 (SUTURE) ×1 IMPLANT
SUT PROLENE 3 0 PS 2 (SUTURE) IMPLANT
SYR 20CC LL (SYRINGE) ×2 IMPLANT
SYR 3ML 23GX1 SAFETY (SYRINGE) IMPLANT
SYR CONTROL 10ML LL (SYRINGE) IMPLANT
TOWEL OR 17X24 6PK STRL BLUE (TOWEL DISPOSABLE) ×2 IMPLANT
TRAY DSU PREP LF (CUSTOM PROCEDURE TRAY) ×2 IMPLANT
UNDERPAD 30X30 INCONTINENT (UNDERPADS AND DIAPERS) ×2 IMPLANT
WATER STERILE IRR 1000ML POUR (IV SOLUTION) IMPLANT

## 2011-11-04 NOTE — Anesthesia Procedure Notes (Addendum)
Procedure Name: MAC Date/Time: 11/04/2011 10:30 AM Performed by: Radford Pax Pre-anesthesia Checklist: Patient identified, Timeout performed, Emergency Drugs available, Suction available and Patient being monitored Patient Re-evaluated:Patient Re-evaluated prior to induction  Procedure Name: MAC Date/Time: 11/04/2011 10:30 AM Performed by: Radford Pax Pre-anesthesia Checklist: Patient identified, Timeout performed, Emergency Drugs available, Suction available and Patient being monitored Patient Re-evaluated:Patient Re-evaluated prior to inductionOxygen Delivery Method: Simple face mask

## 2011-11-04 NOTE — Op Note (Signed)
OP NOTE:  11/04/11 [id number]

## 2011-11-04 NOTE — Anesthesia Postprocedure Evaluation (Signed)
Anesthesia Post Note  Patient: Jean Nash  Procedure(s) Performed:  EXCISION MASS - excisional biopsy right ulna mass  Anesthesia type: MAC  Patient location: PACU  Post pain: Pain level controlled and Adequate analgesia  Post assessment: Post-op Vital signs reviewed, Patient's Cardiovascular Status Stable and Respiratory Function Stable  Last Vitals:  Filed Vitals:   11/04/11 1130  BP: 114/76  Pulse: 60  Temp:   Resp: 16    Post vital signs: Reviewed and stable  Level of consciousness: awake, alert  and oriented  Complications: No apparent anesthesia complications

## 2011-11-04 NOTE — Transfer of Care (Signed)
Immediate Anesthesia Transfer of Care Note  Patient: Jean Nash  Procedure(s) Performed:  EXCISION MASS - excisional biopsy right ulna mass  Patient Location: PACU  Anesthesia Type: MAC  Level of Consciousness: awake, alert , oriented and patient cooperative  Airway & Oxygen Therapy: Patient Spontanous Breathing and Patient connected to face mask oxygen  Post-op Assessment: Report given to PACU RN and Post -op Vital signs reviewed and stable  Post vital signs: Reviewed and stable  Complications: No apparent anesthesia complications

## 2011-11-04 NOTE — Brief Op Note (Signed)
11/04/2011  10:50 AM  PATIENT:  [name]  70 y.o. female  PRE-OPERATIVE DIAGNOSIS:  lump right proximal forearm ulnar border  POST-OPERATIVE DIAGNOSIS:  Vascular lesion  PROCEDURE:  Procedure(s): EXCISION MASS ULNAR PROXIMAL FOREARM FOR BIOPSY  SURGEON:  Surgeon(s): Wyn Forster., MD  PHYSICIAN ASSISTANT:   ASSISTANTS: Mallory Shirk.A-C    ANESTHESIA:   local  EBL:  Total I/O In: 50 [I.V.:50] Out: -   BLOOD ADMINISTERED:none  DRAINS: none   LOCAL MEDICATIONS USED:  XYLOCAINE 2.5CC 2%  SPECIMEN:  Excision  DISPOSITION OF SPECIMEN:  PATHOLOGY  COUNTS:  YES  TOURNIQUET:   Total Tourniquet Time Documented: Upper Arm (Right) - 4 minutes  DICTATION: .Other Dictation: Dictation Number [id number]  PLAN OF CARE: Discharge to home after PACU  PATIENT DISPOSITION:  PACU - hemodynamically stable.

## 2011-11-04 NOTE — Anesthesia Preprocedure Evaluation (Signed)
Anesthesia Evaluation  Patient identified by MRN, date of birth, ID band Patient awake    Reviewed: Allergy & Precautions, H&P , NPO status , Patient's Chart, lab work & pertinent test results  Airway Mallampati: II  Neck ROM: full    Dental   Pulmonary asthma ,          Cardiovascular     Neuro/Psych  Headaches,  Neuromuscular disease    GI/Hepatic GERD-  ,  Endo/Other  Hypothyroidism Morbid obesity  Renal/GU      Musculoskeletal  (+) Fibromyalgia -  Abdominal   Peds  Hematology   Anesthesia Other Findings   Reproductive/Obstetrics                           Anesthesia Physical Anesthesia Plan  ASA: III  Anesthesia Plan: MAC   Post-op Pain Management:    Induction: Intravenous  Airway Management Planned: Simple Face Mask  Additional Equipment:   Intra-op Plan:   Post-operative Plan:   Informed Consent: I have reviewed the patients History and Physical, chart, labs and discussed the procedure including the risks, benefits and alternatives for the proposed anesthesia with the patient or authorized representative who has indicated his/her understanding and acceptance.     Plan Discussed with: CRNA and Surgeon  Anesthesia Plan Comments:         Anesthesia Quick Evaluation

## 2011-11-04 NOTE — H&P (Signed)
  October 18, 2011   Jean Lemons, MD Fax# [phone]  RE: Jean Nash DOB: [birth year] MEDICARE   Dear Jean Nash:  Jean Nash returned with her husband, Jean Nash, to request a biopsy of a mass from the dorsal surface of her right proximal forearm.   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 She has multiple background medical problems including generalized arthritis, type II diabetes, gastric hyperacidity, and allergies.   She brings a detailed computer printout of her family history and background medical problems that are reviewed in detail. She has been evaluated by Dr. Mina Marble for bilateral thumb arthrosis. She presents as an established patient at this time to review her current predicaments.   She has spinal arthrosis. She has had reconstructive foot surgery. You are her primary care physician.   Her past history reveals that she reports no drug allergies. We have reviewed her past surgery history, social history and review of systems and find them otherwise unchanged.   Clinical exam at this time reveals a 1.2 cm mass in the subcutaneous tissue which is consistent with an granulomatous annulare or possible rheumatoid nodule. There is no family history of rheumatoid arthritis. She does have a history of fibromyalgia. This may be simply an inflammatory granuloma following direct trauma to her ulnar border of the forearm.   She states that this is painful and bothersome to her. She is very much interested in having this biopsied for a correct diagnosis. Jean Lemons, MD Page 2 October 18, 2011  RE: Jean Nash DOB: [birth year]  We will schedule her for excisional biopsy of her dorsal forearm mass for diagnosis and hopeful resolution. The surgery, after care, risks and benefits were described in detail. Questions regarding the surgery were invited and answered in detail.    I will keep you informed of her progress. Thank you for allowing me to participate in the care of your  patients.  Best regards,    Jean Nash. Jean Nash., MD RVS/phe T: 10-20-11   H&P documentation: 11/07/2011  -History and Physical Reviewed  -Patient has been re-examined  -No change in the plan of care  Jean Forster, MD

## 2011-11-04 NOTE — H&P (Signed)
Jean Nash is an 70 y.o. female.   Chief Complaint: Complaining of mass dorsal surface of right proximal forearm. HPI: This is a 70 year old right-hand-dominant female who presented to our office complaining of a mass in the dorsal aspect of the right proximal forearm he states that this is been present for several years and is slowly increasing in size. She relates no specific injury to the arm. This is nonpainful but she is concerned and would like to have this surgically removed for biopsy.  Past Medical History  Diagnosis Date  . Chest pain, atypical     Negative stress test 02/2009  . Other and unspecified hyperlipidemia   . Personal history of urinary calculi   . Pain in joint, shoulder region   . Irritable bowel syndrome   . Chronic insomnia   . GERD (gastroesophageal reflux disease)   . History of colonic polyps   . Fibromyalgia   . Hypothyroidism   . Other abnormal glucose   . Stress incontinence, female   . Macular degeneration   . Cluster headaches     history of migraines  . Meniscus tear     history of right knee torn meniscus  . Asthma     hx of -no inhalers  . Arthritis   . Kidney stone     Past Surgical History  Procedure Date  . Appendectomy   . Cholecystectomy   . Abdominal hysterectomy   . Tonsillectomy   . Eye surgery     to repair macular hole  . Cataract extraction, bilateral   . Knee arthroscopy     > 10 years ago  . Tonsillectomy   . Joint replacement 03/15/11    left knee replacement  . Colonoscopy   . Ganglion cyst excision     rt foot  . Foot arthroplasty     lt     Family History  Problem Relation Age of Onset  . Stroke Mother     died age 60  . Diabetes Mother    Social History:  reports that she has never smoked. She does not have any smokeless tobacco history on file. She reports that she does not drink alcohol or use illicit drugs.  Allergies: No Known Allergies  Medications Prior to Admission  Medication Dose Route  Frequency Provider Last Rate Last Dose  . ceFAZolin (ANCEF) IVPB 1 g/50 mL premix  1 g Intravenous 60 min Pre-Op       . chlorhexidine (HIBICLENS) 4 % liquid 4 application  60 mL Topical Once       . lactated ringers infusion   Intravenous Continuous Germaine Pomfret, MD 20 mL/hr at 11/04/11 0857     Medications Prior to Admission  Medication Sig Dispense Refill  . calcium carbonate (OS-CAL) 600 MG TABS Take 600 mg by mouth 2 (two) times daily with a meal.        . Coenzyme Q10 150 MG CAPS Take 150 mg by mouth daily.        . diazepam (VALIUM) 5 MG tablet Take 1 tablet (5 mg total) by mouth every 6 (six) hours as needed.  30 tablet  3  . docusate sodium (STOOL SOFTENER) 100 MG capsule Take 100 mg by mouth 3 (three) times daily as needed.        . fexofenadine (ALLEGRA) 180 MG tablet Take 180 mg by mouth daily.        Marland Kitchen levothyroxine (SYNTHROID, LEVOTHROID) 50 MCG tablet Take 1 tablet (50  mcg total) by mouth daily.  90 tablet  1  . meloxicam (MOBIC) 7.5 MG tablet One tablet by mouth once daily only as needed for pain.  90 tablet  1  . methocarbamol (ROBAXIN) 500 MG tablet Take 500 mg by mouth. Every 6-8 hours as needed for muscle spasm.       . Multiple Vitamin (MULTIVITAMIN PO) Take by mouth daily.        . polycarbophil (FIBERCON) 625 MG tablet Take 625 mg by mouth daily.        . Probiotic Product (ACIDOPHILUS PROBIOTIC BLEND) CAPS Take by mouth daily.        . simvastatin (ZOCOR) 10 MG tablet Take 1 tablet (10 mg total) by mouth at bedtime.  90 tablet  1  . solifenacin (VESICARE) 5 MG tablet Take 1 tablet (5 mg total) by mouth daily.  90 tablet  1  . traMADol (ULTRAM) 50 MG tablet Take 1 tablet by mouth Twice daily.      . butalbital-acetaminophen-caffeine (FIORICET WITH CODEINE) 50-325-40-30 MG per capsule Take 1 capsule by mouth every 4 (four) hours as needed.        . griseofulvin (GRIFULVIN V) 500 MG tablet Take 1 tablet (500 mg total) by mouth daily.  45 tablet  0    Results for  orders placed during the hospital encounter of 11/04/11 (from the past 48 hour(s))  BASIC METABOLIC PANEL     Status: Abnormal   Collection Time   11/03/11  2:00 PM      Component Value Range Comment   Sodium 139  135 - 145 (mEq/L)    Potassium 4.7  3.5 - 5.1 (mEq/L)    Chloride 104  96 - 112 (mEq/L)    CO2 25  19 - 32 (mEq/L)    Glucose, Bld 99  70 - 99 (mg/dL)    BUN 18  6 - 23 (mg/dL)    Creatinine, Ser 1.61  0.50 - 1.10 (mg/dL)    Calcium 9.0  8.4 - 10.5 (mg/dL)    GFR calc non Af Amer 84 (*) >90 (mL/min)    GFR calc Af Amer >90  >90 (mL/min)     No results found.   Pertinent items are noted in HPI.  Blood pressure 138/84, pulse 69, temperature 98.4 F (36.9 C), temperature source Oral, resp. rate 20, height 5' (1.524 m), weight 83.915 kg (185 lb), SpO2 96.00%.  General appearance: alert Head: Normocephalic, without obvious abnormality Neck: supple, symmetrical, trachea midline Resp: clear to auscultation bilaterally Cardio: regular rate and rhythm, S1, S2 normal, no murmur, click, rub or gallop GI: normal findings: bowel sounds normal Extremities: Emanation of the right proximal forearm reveals a 1.2 cm mass in the subcutaneous tissue which is consistent with a granulomatous annulare or possible rheumatoid nodule. Neurovascularly she is intact distally. She has good range of motion of her fingers and wrist.  Pulses: 2+ and symmetric Skin: normal Neurologic: Grossly normal    Assessment/Plan  impression: Mass dorsal surface of right proximal forearm  Plan: Patient to be taken to the operating room to undergo excisional biopsy of the dorsal forearm mass from the right arm. This will be sent to pathology for examination. The surgery aftercare risks benefits were described in detail. Questions regarding the surgery were invited and answered in detail.  DASNOIT,Neka Bise J 11/04/2011, 9:23 AM  H&P documentation: 11/04/2011  -History and Physical Reviewed  -Patient has  been re-examined  -No change in the plan of care  Wyn Forster, MD

## 2011-11-05 ENCOUNTER — Encounter (HOSPITAL_BASED_OUTPATIENT_CLINIC_OR_DEPARTMENT_OTHER): Payer: Self-pay | Admitting: Orthopedic Surgery

## 2011-11-05 NOTE — Op Note (Signed)
NAME:  Jean Nash, Jean Nash                   ACCOUNT NO.:  MEDICAL RECORD NO.:  [id number]  LOCATION:                                 FACILITY:  PHYSICIAN:  Jean Fitch. Chantal Worthey, M.D. DATE OF BIRTH:  [birth year]  DATE OF PROCEDURE:  11/04/2011 DATE OF DISCHARGE:                              OPERATIVE REPORT   PREOPERATIVE DIAGNOSIS:  Uncomfortable mass, proximal aspect of right ulnar subcutaneous border overlying flexor carpi ulnaris muscle.  POSTOPERATIVE DIAGNOSIS:  Vascular mass, rule out glomus tumor versus hemangioma.  OPERATION:  Excisional biopsy for histopathologic evaluation of mass, right proximal forearm.  OPERATING SURGEON:  Jean Fitch. Briseyda Fehr, MD  ASSISTANT:  Marveen Reeks Dasnoit, PA  ANESTHESIA:  2% lidocaine field block supplemented by IV sedation.  SUPERVISING ANESTHESIOLOGIST:  Achille Rich, MD  INDICATIONS:  Jean Nash is a 70 year old woman referred through the courtesy of Dr. Thomos Lemons, [facility].  She has multiple medical problems.  She was concerned about a mass present on the dorsal aspect of her forearm that had increased in size and it was uncomfortable when she leaned on her forearm.  This had a bluish discoloration suggesting some type of vascular lesion.  She requested excision for diagnosis in hope for resolution of this predicament.  Preoperatively, she was reminded the potential risks and benefits of surgery.  Primary risks would be infection of the wound and failure to relieve all her pain.  We will be able to provide a prognosis once we understand what the pathologic predicament is after histopathologic evaluation of this lesion.  After informed consent, she was brought to the operating room at this time.  PROCEDURE:  Jean Nash was brought to room #1 of the [facility] and placed in supine position on the operating table.  Following informed consent by Dr. Chaney Malling, monitored anesthesia technique was recommended and  accepted by Jean Nash.  In room #1 under Dr. Seward Meth supervision, IV sedation was provided followed by Betadine prep of the forearm.  A 2.5 mL of 2% lidocaine without epinephrine were infiltrated into the path of intended incision and into the subcutaneous region down to the fascia of the flexor carpi ulnaris.  The right arm was then prepped with Betadine soap solution, sterilely draped.  A pneumatic tourniquet was applied to the proximal right brachium.  Following exsanguination of the right arm with Esmarch bandage, the arterial tourniquet was inflated to 220 mmHg.  After routine surgical time-out, procedure commenced with a 2-cm incision directly over the mass.  Subcutaneous tissues were carefully divided identifying the fascia overlying the flexor carpi ulnaris.  The mass was noted to be deep to the fascia, multilobular, purplish, vascular, and solid.  This could be a large glomus or could represent an atypical hemangioma.  This was circumferentially dissected and its vessels were electrocauterized at least 2 mm away from the obvious mass. Subcutaneous tissues of the proximal formal were carefully inspected, no other satellite masses were identified.  The specimen was passed off the pathologic evaluation in formalin followed by repair of the wound with intradermal 3-0 Prolene and Steri- Strips.  There were no apparent complications.  For aftercare, Jean Nash  will be provided a prescription for hydrocodone 5 mg 1 p.o. q.4-6 hours p.r.n. pain, 16 tablets without refill.  Also, she requested due to the presence of a total knee IV antibiotics preoperatively.  She was provided 2 g of Ancef per weight protocol.     Jean Nash, M.D.     RVS/MEDQ  D:  11/04/2011  T:  11/05/2011  Job:  [id number]  cc:   Barbette Hair. Artist Pais, DO

## 2011-11-23 DIAGNOSIS — IMO0002 Reserved for concepts with insufficient information to code with codable children: Secondary | ICD-10-CM | POA: Diagnosis not present

## 2011-11-23 DIAGNOSIS — M1710 Unilateral primary osteoarthritis, unspecified knee: Secondary | ICD-10-CM | POA: Diagnosis not present

## 2011-11-24 ENCOUNTER — Telehealth: Payer: Self-pay | Admitting: Internal Medicine

## 2011-11-24 NOTE — Telephone Encounter (Signed)
Please call phar needs direction and qty on lovaza

## 2011-11-29 ENCOUNTER — Telehealth: Payer: Self-pay | Admitting: Internal Medicine

## 2011-11-29 MED ORDER — OMEGA-3-ACID ETHYL ESTERS 1 G PO CAPS: 2.0000 g | ORAL_CAPSULE | Freq: Two times a day (BID) | ORAL | Status: DC

## 2011-11-29 NOTE — Telephone Encounter (Signed)
Lovaza 1 gram  2 caps bid. # 360 Refill x 3

## 2011-11-29 NOTE — Telephone Encounter (Signed)
[facility] pharmacy called and needs clarification on pts script for omega-3 acid ethyl esters (LOVAZA) 1 G capsule. Qty and dosage instruction are conflicting.

## 2011-11-29 NOTE — Telephone Encounter (Signed)
rx corrected and sent in electronically 

## 2011-11-29 NOTE — Telephone Encounter (Signed)
Could you clarify the dose and I will call them?

## 2011-11-29 NOTE — Telephone Encounter (Signed)
Already done

## 2011-12-02 DIAGNOSIS — Z803 Family history of malignant neoplasm of breast: Secondary | ICD-10-CM | POA: Diagnosis not present

## 2011-12-02 DIAGNOSIS — L680 Hirsutism: Secondary | ICD-10-CM | POA: Diagnosis not present

## 2011-12-02 DIAGNOSIS — E669 Obesity, unspecified: Secondary | ICD-10-CM | POA: Diagnosis not present

## 2011-12-02 DIAGNOSIS — L659 Nonscarring hair loss, unspecified: Secondary | ICD-10-CM | POA: Diagnosis not present

## 2011-12-14 ENCOUNTER — Other Ambulatory Visit (INDEPENDENT_AMBULATORY_CARE_PROVIDER_SITE_OTHER): Payer: Medicare Other

## 2011-12-14 DIAGNOSIS — I10 Essential (primary) hypertension: Secondary | ICD-10-CM | POA: Diagnosis not present

## 2011-12-14 DIAGNOSIS — N390 Urinary tract infection, site not specified: Secondary | ICD-10-CM

## 2011-12-14 DIAGNOSIS — Z0000 Encounter for general adult medical examination without abnormal findings: Secondary | ICD-10-CM

## 2011-12-14 DIAGNOSIS — E039 Hypothyroidism, unspecified: Secondary | ICD-10-CM

## 2011-12-14 DIAGNOSIS — E785 Hyperlipidemia, unspecified: Secondary | ICD-10-CM

## 2011-12-14 DIAGNOSIS — D649 Anemia, unspecified: Secondary | ICD-10-CM | POA: Diagnosis not present

## 2011-12-14 LAB — POCT URINALYSIS DIPSTICK
Bilirubin, UA: NEGATIVE
Leukocytes, UA: NEGATIVE
Nitrite, UA: NEGATIVE
Protein, UA: NEGATIVE
Urobilinogen, UA: 0.2
pH, UA: 6.5

## 2011-12-14 LAB — CBC WITH DIFFERENTIAL/PLATELET
Basophils Relative: 0.4 % (ref 0.0–3.0)
Eosinophils Absolute: 0.2 10*3/uL (ref 0.0–0.7)
Lymphocytes Relative: 30 % (ref 12.0–46.0)
MCHC: 33.3 g/dL (ref 30.0–36.0)
Neutrophils Relative %: 58.5 % (ref 43.0–77.0)
RBC: 4.71 Mil/uL (ref 3.87–5.11)
WBC: 7.2 10*3/uL (ref 4.5–10.5)

## 2011-12-14 LAB — BASIC METABOLIC PANEL
CO2: 29 mEq/L (ref 19–32)
Calcium: 8.8 mg/dL (ref 8.4–10.5)
Creatinine, Ser: 0.6 mg/dL (ref 0.4–1.2)

## 2011-12-14 LAB — LIPID PANEL
HDL: 41.6 mg/dL (ref >39.00)
LDL Cholesterol: 109 mg/dL — ABNORMAL HIGH (ref 0–99)
Total CHOL/HDL Ratio: 4
VLDL: 11.8 mg/dL (ref 0.0–40.0)

## 2011-12-14 LAB — HEPATIC FUNCTION PANEL
Alkaline Phosphatase: 68 U/L (ref 39–117)
Bilirubin, Direct: 0 mg/dL (ref 0.0–0.3)
Total Protein: 6.5 g/dL (ref 6.0–8.3)

## 2011-12-16 DIAGNOSIS — H3530 Unspecified macular degeneration: Secondary | ICD-10-CM | POA: Diagnosis not present

## 2011-12-20 ENCOUNTER — Ambulatory Visit (INDEPENDENT_AMBULATORY_CARE_PROVIDER_SITE_OTHER): Payer: Medicare Other | Admitting: Internal Medicine

## 2011-12-20 ENCOUNTER — Encounter: Payer: Self-pay | Admitting: Internal Medicine

## 2011-12-20 DIAGNOSIS — R7309 Other abnormal glucose: Secondary | ICD-10-CM | POA: Diagnosis not present

## 2011-12-20 DIAGNOSIS — E039 Hypothyroidism, unspecified: Secondary | ICD-10-CM

## 2011-12-20 DIAGNOSIS — M79609 Pain in unspecified limb: Secondary | ICD-10-CM

## 2011-12-20 DIAGNOSIS — G4700 Insomnia, unspecified: Secondary | ICD-10-CM

## 2011-12-20 DIAGNOSIS — M79671 Pain in right foot: Secondary | ICD-10-CM

## 2011-12-20 MED ORDER — TEMAZEPAM 7.5 MG PO CAPS: 7.5000 mg | ORAL_CAPSULE | Freq: Every evening | ORAL | Status: DC | PRN

## 2011-12-20 NOTE — Assessment & Plan Note (Signed)
Pt over treated.  Reduce dose to 25 mcg.  Recheck TSH in 2 months.

## 2011-12-20 NOTE — Assessment & Plan Note (Signed)
Tape restoril dose to 7.5 mg

## 2011-12-20 NOTE — Assessment & Plan Note (Signed)
She was seen by Dr. Victorino Dike.   She has severe foot arthritis. He recommended fusion surgery . She has deferred surgery since her symptoms have significantly improved with use of orthotic shoes.

## 2011-12-20 NOTE — Patient Instructions (Signed)
Please complete the following lab tests before your next follow up appointment: BMET, A1c - 250.00  TSH -  244.9 (schedule within 2 months)

## 2011-12-20 NOTE — Assessment & Plan Note (Signed)
I suggest patient's maintain metformin therapy. Continue to monitor her A1c periodically.

## 2011-12-20 NOTE — Progress Notes (Signed)
Subjective:    [name], female    DOB: 07/29/1941, 71 y.o.   MRN: [id number]  HPI  71 year old white female for followup. Since previous visit patient has had surgery on her right elbow cyst. The cyst was benign but the patient is still having intermittent discomfort in that area. Patient is also seen by foot specialist. He recommended possible fusion surgery due to severe foot arthritis. She has deferred surgery and has started wearing orthotic shoes which has significantly improved her foot symptoms.  She has received 3 injections of hyaluronic acid in her right knee. Her knee symptoms have significantly improved.  Type 2 diabetes borderline -  her endocrinologist gave her the option of stopping metformin. She has not taken for the last several weeks.  Review of Systems Negative for chest pain  Past Medical History  Diagnosis Date  . Chest pain, atypical     Negative stress test 02/2009  . Other and unspecified hyperlipidemia   . Personal history of urinary calculi   . Pain in joint, shoulder region   . Irritable bowel syndrome   . Chronic insomnia   . GERD (gastroesophageal reflux disease)   . History of colonic polyps   . Fibromyalgia   . Hypothyroidism   . Other abnormal glucose   . Stress incontinence, female   . Macular degeneration   . Cluster headaches     history of migraines  . Meniscus tear     history of right knee torn meniscus  . Asthma     hx of -no inhalers  . Arthritis   . Kidney stone     History   Social History  . Marital Status: Married    Spouse Name: N/A    Number of Children: N/A  . Years of Education: N/A   Occupational History  . Not on file.   Social History Main Topics  . Smoking status: Never Smoker   . Smokeless tobacco: Not on file  . Alcohol Use: No  . Drug Use: No  . Sexually Active: Not on file   Other Topics Concern  . 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 Appendectomy   . Cholecystectomy   . Abdominal hysterectomy   . Tonsillectomy   . Eye surgery     to repair macular hole  . Cataract extraction, bilateral   . Knee arthroscopy     > 10 years ago  . Tonsillectomy   . Joint replacement 03/15/11    left knee replacement  . Colonoscopy   . Ganglion cyst excision     rt foot  . Foot arthroplasty     lt   . Mass excision 11/04/2011    Procedure: EXCISION MASS;  Surgeon: Wyn Forster., MD;  Location: [facility];  Service: Orthopedics;  Laterality: Right;  excisional biopsy right ulna mass    Family History  Problem Relation Age of Onset  . Stroke Mother     died age 51  . Diabetes Mother     No Known Allergies  Current Outpatient Prescriptions on File Prior to Visit  Medication Sig Dispense Refill  . butalbital-acetaminophen-caffeine (FIORICET WITH CODEINE) 50-325-40-30 MG per capsule Take 1 capsule by mouth every 4 (four) hours as needed.        Marland Kitchen  calcium carbonate (OS-CAL) 600 MG TABS Take 600 mg by mouth 2 (two) times daily with a meal.        . citalopram (CELEXA) 20 MG tablet Take 1 tablet (20 mg total) by mouth daily.  90 tablet  1  . Coenzyme Q10 150 MG CAPS Take 150 mg by mouth daily.        . diazepam (VALIUM) 5 MG tablet Take 1 tablet (5 mg total) by mouth every 6 (six) hours as needed.  30 tablet  3  . docusate sodium (STOOL SOFTENER) 100 MG capsule Take 100 mg by mouth 3 (three) times daily as needed.        Marland Kitchen esomeprazole (NEXIUM) 40 MG capsule Take 1 capsule (40 mg total) by mouth 2 (two) times daily.  90 capsule  1  . fexofenadine (ALLEGRA) 180 MG tablet Take 180 mg by mouth daily.        Marland Kitchen levothyroxine (SYNTHROID, LEVOTHROID) 50 MCG tablet Take 1 tablet (50 mcg total) by mouth daily.  90 tablet  1  . meloxicam (MOBIC) 7.5 MG tablet One tablet by mouth once daily only as needed for pain.  90  tablet  1  . metFORMIN (GLUCOPHAGE) 500 MG tablet Take 1 tablet (500 mg total) by mouth 2 (two) times daily with a meal.  180 tablet  1  . Multiple Vitamin (MULTIVITAMIN PO) Take by mouth daily.        Marland Kitchen omega-3 acid ethyl esters (LOVAZA) 1 G capsule Take 2 capsules (2 g total) by mouth 2 (two) times daily.  360 capsule  3  . pregabalin (LYRICA) 75 MG capsule Take 1 capsule (75 mg total) by mouth 2 (two) times daily.  180 capsule  0  . Probiotic Product (ACIDOPHILUS PROBIOTIC BLEND) CAPS Take by mouth daily.        . simvastatin (ZOCOR) 10 MG tablet Take 1 tablet (10 mg total) by mouth at bedtime.  90 tablet  1  . solifenacin (VESICARE) 5 MG tablet Take 1 tablet (5 mg total) by mouth daily.  90 tablet  1    BP 120/82  Pulse 76  Temp(Src) 98 F (36.7 C) (Oral)  Wt 190 lb (86.183 kg)       Objective:   Physical Exam  Constitutional: She is oriented to person, place, and time. She appears well-developed and well-nourished.  Cardiovascular: Normal rate, regular rhythm and normal heart sounds.   Pulmonary/Chest: Effort normal and breath sounds normal. She has no wheezes. She has no rales.  Musculoskeletal: She exhibits no edema.  Neurological: She is alert and oriented to person, place, and time.  Skin: Skin is warm and dry.  Psychiatric: She has a normal mood and affect. Her behavior is normal.     Assessment & Plan:

## 2011-12-27 ENCOUNTER — Telehealth: Payer: Self-pay | Admitting: Internal Medicine

## 2011-12-27 MED ORDER — TEMAZEPAM 7.5 MG PO CAPS: 7.5000 mg | ORAL_CAPSULE | Freq: Every evening | ORAL | Status: DC | PRN

## 2011-12-27 NOTE — Telephone Encounter (Signed)
rx faxed in 

## 2011-12-27 NOTE — Telephone Encounter (Signed)
Ok to fax rx.  

## 2011-12-27 NOTE — Telephone Encounter (Signed)
Pt is needing her script for temazepam (RESTORIL) 7.5 MG capsule to be faxed to [facility] mail order pharmacy.

## 2012-01-05 ENCOUNTER — Encounter: Payer: Self-pay | Admitting: Gastroenterology

## 2012-01-24 ENCOUNTER — Telehealth: Payer: Self-pay | Admitting: Internal Medicine

## 2012-01-24 ENCOUNTER — Encounter: Payer: Self-pay | Admitting: Gastroenterology

## 2012-01-24 DIAGNOSIS — R32 Unspecified urinary incontinence: Secondary | ICD-10-CM

## 2012-01-24 MED ORDER — MELOXICAM 7.5 MG PO TABS: ORAL_TABLET | ORAL | Status: DC

## 2012-01-24 MED ORDER — SOLIFENACIN SUCCINATE 5 MG PO TABS: 5.0000 mg | ORAL_TABLET | Freq: Every day | ORAL | Status: DC

## 2012-01-24 MED ORDER — ESOMEPRAZOLE MAGNESIUM 40 MG PO CPDR: 40.0000 mg | DELAYED_RELEASE_CAPSULE | Freq: Two times a day (BID) | ORAL | Status: DC

## 2012-01-24 NOTE — Telephone Encounter (Signed)
Sent all rx's electronically.  Is it ok to refill Lyrica?

## 2012-01-24 NOTE — Telephone Encounter (Signed)
Ok for 3 month refill x 2

## 2012-01-24 NOTE — Telephone Encounter (Signed)
Patient's spouse called stating patient needs new rxs on his nexium 40mg , lyrica 75mg , maloxicam 7.5mg , vesicare 5mg , all with 3 mth supply and send to [facility]. Please assist.

## 2012-01-26 MED ORDER — PREGABALIN 75 MG PO CAPS: 75.0000 mg | ORAL_CAPSULE | Freq: Two times a day (BID) | ORAL | Status: DC

## 2012-01-26 NOTE — Telephone Encounter (Signed)
rx faxed to pharmacy

## 2012-01-26 NOTE — Telephone Encounter (Signed)
Addended by: Alfred Levins D on: 01/26/2012 10:16 AM   Modules accepted: Orders

## 2012-01-31 ENCOUNTER — Telehealth: Payer: Self-pay | Admitting: *Deleted

## 2012-01-31 DIAGNOSIS — R32 Unspecified urinary incontinence: Secondary | ICD-10-CM

## 2012-01-31 MED ORDER — SOLIFENACIN SUCCINATE 5 MG PO TABS: 5.0000 mg | ORAL_TABLET | Freq: Every day | ORAL | Status: DC

## 2012-01-31 NOTE — Telephone Encounter (Signed)
Pt did not get the Vesicare and they need it sent ASAP, please.

## 2012-02-02 ENCOUNTER — Ambulatory Visit (INDEPENDENT_AMBULATORY_CARE_PROVIDER_SITE_OTHER): Payer: Medicare Other | Admitting: Internal Medicine

## 2012-02-02 ENCOUNTER — Encounter: Payer: Self-pay | Admitting: Internal Medicine

## 2012-02-02 VITALS — BP 124/84 | Temp 98.3°F | Wt 191.0 lb

## 2012-02-02 DIAGNOSIS — M25531 Pain in right wrist: Secondary | ICD-10-CM | POA: Insufficient documentation

## 2012-02-02 DIAGNOSIS — M25539 Pain in unspecified wrist: Secondary | ICD-10-CM | POA: Diagnosis not present

## 2012-02-02 DIAGNOSIS — E039 Hypothyroidism, unspecified: Secondary | ICD-10-CM | POA: Diagnosis not present

## 2012-02-02 MED ORDER — DICLOFENAC SODIUM 1 % TD GEL: 1.0000 "application " | Freq: Three times a day (TID) | TRANSDERMAL | Status: DC

## 2012-02-02 NOTE — Patient Instructions (Signed)
Call our office if your right wrist pain has not improved after 2 weeks

## 2012-02-02 NOTE — Assessment & Plan Note (Signed)
Monitor TFTs

## 2012-02-02 NOTE — Assessment & Plan Note (Signed)
71 year old female complains of intermittent right wrist pain. She has remote history of wrist fracture in the past. I suspect pain secondary to arthritic changes. I suggested immobilization with wrist splint x2 weeks and use of voltaren gel 3 times a day. If persistent symptoms we discussed referral to hand specialist.

## 2012-02-02 NOTE — Progress Notes (Signed)
Subjective:    Patient ID: [name], female    DOB: 1941/04/27, 71 y.o.   MRN: [id number]  HPI  71 year old white female complains of right wrist pain. She has remote history of right wrist fracture. She denies any recent injury or trigger. She got up this morning and experienced intermittent wrist pain with any kind of movement. Husband reports she likes to knit and work on the computer.  Patient applied Pennsaid this AM and her wrist is starting to feel better.   Review of Systems Negative for fever  Past Medical History  Diagnosis Date  . Chest pain, atypical     Negative stress test 02/2009  . Other and unspecified hyperlipidemia   . Personal history of urinary calculi   . Pain in joint, shoulder region   . Irritable bowel syndrome   . Chronic insomnia   . GERD (gastroesophageal reflux disease)   . History of colonic polyps   . Fibromyalgia   . Hypothyroidism   . Other abnormal glucose   . Stress incontinence, female   . Macular degeneration   . Cluster headaches     history of migraines  . Meniscus tear     history of right knee torn meniscus  . Asthma     hx of -no inhalers  . Arthritis   . Kidney stone     History   Social History  . Marital Status: Married    Spouse Name: N/A    Number of Children: N/A  . Years of Education: N/A   Occupational History  . Not on file.   Social History Main Topics  . Smoking status: Never Smoker   . Smokeless tobacco: Not on file  . Alcohol Use: No  . Drug Use: No  . Sexually Active: Not on file   Other Topics Concern  . 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 Appendectomy   . Cholecystectomy   . Abdominal hysterectomy   . Tonsillectomy   . Eye surgery     to repair macular hole  . Cataract extraction, bilateral   . Knee arthroscopy     > 10 years ago  .  Tonsillectomy   . Joint replacement 03/15/11    left knee replacement  . Colonoscopy   . Ganglion cyst excision     rt foot  . Foot arthroplasty     lt   . Mass excision 11/04/2011    Procedure: EXCISION MASS;  Surgeon: Wyn Forster., MD;  Location: [facility];  Service: Orthopedics;  Laterality: Right;  excisional biopsy right ulna mass    Family History  Problem Relation Age of Onset  . Stroke Mother     died age 26  . Diabetes Mother     No Known Allergies  Current Outpatient Prescriptions on File Prior to Visit  Medication Sig Dispense Refill  . butalbital-acetaminophen-caffeine (FIORICET WITH CODEINE) 50-325-40-30 MG per capsule Take 1 capsule by mouth every 4 (four) hours as needed.        . calcium carbonate (OS-CAL) 600 MG TABS Take 600 mg by mouth 2 (two) times daily with a meal.        . citalopram (CELEXA) 20 MG tablet Take 1 tablet (20 mg total) by mouth daily.  90 tablet  1  . Coenzyme Q10 150 MG CAPS Take 150 mg by mouth daily.        . diazepam (VALIUM) 5 MG tablet Take 1 tablet (5 mg total) by mouth every 6 (six) hours as needed.  30 tablet  3  . docusate sodium (STOOL SOFTENER) 100 MG capsule Take 100 mg by mouth 3 (three) times daily as needed.        Marland Kitchen esomeprazole (NEXIUM) 40 MG capsule Take 1 capsule (40 mg total) by mouth 2 (two) times daily.  90 capsule  1  . fexofenadine (ALLEGRA) 180 MG tablet Take 180 mg by mouth daily.        Marland Kitchen levothyroxine (SYNTHROID, LEVOTHROID) 50 MCG tablet Take 1 tablet (50 mcg total) by mouth daily.  90 tablet  1  . meloxicam (MOBIC) 7.5 MG tablet One tablet by mouth once daily only as needed for pain.  90 tablet  1  . metFORMIN (GLUCOPHAGE) 500 MG tablet Take 1 tablet (500 mg total) by mouth 2 (two) times daily with a meal.  180 tablet  1  . Multiple Vitamin (MULTIVITAMIN PO) Take by mouth daily.        . multivitamin-lutein (OCUVITE-LUTEIN) CAPS Take 1 capsule by mouth daily.      Marland Kitchen omega-3 acid ethyl esters  (LOVAZA) 1 G capsule Take 2 capsules (2 g total) by mouth 2 (two) times daily.  360 capsule  3  . pregabalin (LYRICA) 75 MG capsule Take 1 capsule (75 mg total) by mouth 2 (two) times daily.  180 capsule  0  . Probiotic Product (ACIDOPHILUS PROBIOTIC BLEND) CAPS Take by mouth daily.        . simvastatin (ZOCOR) 10 MG tablet Take 1 tablet (10 mg total) by mouth at bedtime.  90 tablet  1  . solifenacin (VESICARE) 5 MG tablet Take 1 tablet (5 mg total) by mouth daily.  90 tablet  1  . temazepam (RESTORIL) 7.5 MG capsule Take 1 capsule (7.5 mg total) by mouth at bedtime as needed.  90 capsule  0    BP 124/84  Temp(Src) 98.3 F (36.8 C) (Oral)  Wt 191 lb (86.637 kg)       Objective:   Physical Exam  Constitutional: She appears well-developed and well-nourished.  Cardiovascular: Normal rate, regular rhythm and normal heart sounds.   Pulmonary/Chest: Effort normal and breath sounds normal. She has no wheezes. She has no rales.  Musculoskeletal:       No obvious deformity Mild discomfort with flexion of right wrist. Normal range of motion          Assessment & Plan:

## 2012-02-07 DIAGNOSIS — B351 Tinea unguium: Secondary | ICD-10-CM | POA: Diagnosis not present

## 2012-02-07 DIAGNOSIS — M79609 Pain in unspecified limb: Secondary | ICD-10-CM | POA: Diagnosis not present

## 2012-02-09 ENCOUNTER — Telehealth: Payer: Self-pay

## 2012-02-09 DIAGNOSIS — R32 Unspecified urinary incontinence: Secondary | ICD-10-CM

## 2012-02-09 MED ORDER — SOLIFENACIN SUCCINATE 5 MG PO TABS: 5.0000 mg | ORAL_TABLET | Freq: Every day | ORAL | Status: DC

## 2012-02-09 NOTE — Telephone Encounter (Signed)
Per pt's husband pt has still not received her Vesicare 5 mg.  Called pt back to advised that rx was sent on 01/31/12.  Rx will be sent again and samples are available for pick up in the office until rx is received.

## 2012-02-09 NOTE — Telephone Encounter (Signed)
Pt aware.

## 2012-02-17 ENCOUNTER — Other Ambulatory Visit: Payer: Medicare Other

## 2012-02-21 ENCOUNTER — Other Ambulatory Visit: Payer: Self-pay | Admitting: Internal Medicine

## 2012-02-21 MED ORDER — AMOXICILLIN 500 MG PO CAPS: ORAL_CAPSULE | ORAL | Status: DC

## 2012-02-21 NOTE — Telephone Encounter (Signed)
Pt hus is requesting amoxicillin 500mg  #30 call into [facility] [address]. Pt takes 4 amoxicillin 1 hr prior to dental work. Pt has several dental ov sch.

## 2012-02-21 NOTE — Telephone Encounter (Signed)
Ok per Dr Artist Pais #4 and take 30-60 minutes prior to dental procedure.  Rx sent in electronically, pt aware

## 2012-02-25 ENCOUNTER — Ambulatory Visit (INDEPENDENT_AMBULATORY_CARE_PROVIDER_SITE_OTHER): Payer: Medicare Other | Admitting: Internal Medicine

## 2012-02-25 ENCOUNTER — Encounter: Payer: Self-pay | Admitting: Internal Medicine

## 2012-02-25 VITALS — BP 142/88 | Temp 98.5°F | Wt 195.0 lb

## 2012-02-25 DIAGNOSIS — H9209 Otalgia, unspecified ear: Secondary | ICD-10-CM

## 2012-02-25 DIAGNOSIS — H9201 Otalgia, right ear: Secondary | ICD-10-CM | POA: Insufficient documentation

## 2012-02-25 NOTE — Assessment & Plan Note (Signed)
I suspect patient's right ear pain is from eustachian tube dysfunction. The patient has intranasal steroids at home. Patient also advised to use decongestant for the next one week.  Patient advised to call office if symptoms persist or worsen.

## 2012-02-25 NOTE — Progress Notes (Signed)
Subjective:    [name], female    DOB: January 07, 1941, 71 y.o.   MRN: [id number]  Otalgia  There is pain in the right ear. This is a new problem. The current episode started yesterday. The problem occurs constantly. There has been no fever. The pain is at a severity of 5/10. Pertinent negatives include no coughing, headaches, hearing loss or sore throat.      Review of Systems  HENT: Positive for ear pain. Negative for hearing loss and sore throat.   Respiratory: Negative for cough.   Neurological: Negative for headaches.   Past Medical History  Diagnosis Date  . Chest pain, atypical     Negative stress test 02/2009  . Other and unspecified hyperlipidemia   . Personal history of urinary calculi   . Pain in joint, shoulder region   . Irritable bowel syndrome   . Chronic insomnia   . GERD (gastroesophageal reflux disease)   . History of colonic polyps   . Fibromyalgia   . Hypothyroidism   . Other abnormal glucose   . Stress incontinence, female   . Macular degeneration   . Cluster headaches     history of migraines  . Meniscus tear     history of right knee torn meniscus  . Asthma     hx of -no inhalers  . Arthritis   . Kidney stone     History   Social History  . Marital Status: Married    Spouse Name: N/A    Number of Children: N/A  . Years of Education: N/A   Occupational History  . Not on file.   Social History Main Topics  . Smoking status: Never Smoker   . Smokeless tobacco: Not on file  . Alcohol Use: No  . Drug Use: No  . Sexually Active: Not on file   Other Topics Concern  . 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 Appendectomy   . Cholecystectomy   . Abdominal hysterectomy   . Tonsillectomy   . Eye surgery     to repair macular hole  . Cataract extraction, bilateral   . Knee  arthroscopy     > 10 years ago  . Tonsillectomy   . Joint replacement 03/15/11    left knee replacement  . Colonoscopy   . Ganglion cyst excision     rt foot  . Foot arthroplasty     lt   . Mass excision 11/04/2011    Procedure: EXCISION MASS;  Surgeon: Wyn Forster., MD;  Location: [facility];  Service: Orthopedics;  Laterality: Right;  excisional biopsy right ulna mass    Family History  Problem Relation Age of Onset  . Stroke Mother     died age 24  . Diabetes Mother     No Known Allergies  Current Outpatient Prescriptions on File Prior to Visit  Medication Sig Dispense Refill  . amoxicillin (AMOXIL) 500 MG capsule Take 4 caps 30-60 minutes prior to dental procedure  4 capsule  0  . butalbital-acetaminophen-caffeine (FIORICET WITH CODEINE) 50-325-40-30 MG per capsule Take 1 capsule by mouth every 4 (four) hours as needed.        . calcium carbonate (OS-CAL) 600 MG TABS Take 600 mg by mouth 2 (two) times  daily with a meal.        . citalopram (CELEXA) 20 MG tablet Take 1 tablet (20 mg total) by mouth daily.  90 tablet  1  . Coenzyme Q10 150 MG CAPS Take 150 mg by mouth daily.        . diazepam (VALIUM) 5 MG tablet Take 1 tablet (5 mg total) by mouth every 6 (six) hours as needed.  30 tablet  3  . diclofenac sodium (VOLTAREN) 1 % GEL Apply 1 application topically 3 (three) times daily.  3 Tube  2  . docusate sodium (STOOL SOFTENER) 100 MG capsule Take 100 mg by mouth 3 (three) times daily as needed.        Marland Kitchen esomeprazole (NEXIUM) 40 MG capsule Take 1 capsule (40 mg total) by mouth 2 (two) times daily.  90 capsule  1  . fexofenadine (ALLEGRA) 180 MG tablet Take 180 mg by mouth daily.        Marland Kitchen levothyroxine (SYNTHROID, LEVOTHROID) 50 MCG tablet Take 1 tablet (50 mcg total) by mouth daily.  90 tablet  1  . meloxicam (MOBIC) 7.5 MG tablet One tablet by mouth once daily only as needed for pain.  90 tablet  1  . metFORMIN (GLUCOPHAGE) 500 MG tablet Take 1 tablet  (500 mg total) by mouth 2 (two) times daily with a meal.  180 tablet  1  . Multiple Vitamin (MULTIVITAMIN PO) Take by mouth daily.        Marland Kitchen omega-3 acid ethyl esters (LOVAZA) 1 G capsule Take 2 capsules (2 g total) by mouth 2 (two) times daily.  360 capsule  3  . pregabalin (LYRICA) 75 MG capsule Take 1 capsule (75 mg total) by mouth 2 (two) times daily.  180 capsule  0  . Probiotic Product (ACIDOPHILUS PROBIOTIC BLEND) CAPS Take by mouth daily.        . simvastatin (ZOCOR) 10 MG tablet Take 1 tablet (10 mg total) by mouth at bedtime.  90 tablet  1  . solifenacin (VESICARE) 5 MG tablet Take 1 tablet (5 mg total) by mouth daily.  90 tablet  1  . temazepam (RESTORIL) 7.5 MG capsule Take 1 capsule (7.5 mg total) by mouth at bedtime as needed.  90 capsule  0    BP 142/88  Temp(Src) 98.5 F (36.9 C) (Oral)  Wt 195 lb (88.451 kg)        Objective:   Physical Exam  Constitutional: She appears well-developed and well-nourished.  HENT:  Head: Normocephalic and atraumatic.       Right tympanic membrane slightly retracted  Neck: Normal range of motion. Neck supple.  Cardiovascular: Normal rate, regular rhythm and normal heart sounds.   Pulmonary/Chest: Effort normal and breath sounds normal. She has no wheezes.          Assessment & Plan:

## 2012-02-29 ENCOUNTER — Ambulatory Visit (AMBULATORY_SURGERY_CENTER): Payer: Medicare Other

## 2012-02-29 VITALS — Ht 60.0 in | Wt 193.0 lb

## 2012-02-29 DIAGNOSIS — Z86010 Personal history of colonic polyps: Secondary | ICD-10-CM

## 2012-02-29 DIAGNOSIS — Z1211 Encounter for screening for malignant neoplasm of colon: Secondary | ICD-10-CM

## 2012-02-29 MED ORDER — PEG-KCL-NACL-NASULF-NA ASC-C 100 G PO SOLR: 1.0000 | Freq: Once | ORAL | Status: AC

## 2012-03-09 DIAGNOSIS — H1044 Vernal conjunctivitis: Secondary | ICD-10-CM | POA: Diagnosis not present

## 2012-03-14 ENCOUNTER — Ambulatory Visit (AMBULATORY_SURGERY_CENTER): Payer: Medicare Other | Admitting: Gastroenterology

## 2012-03-14 ENCOUNTER — Encounter: Payer: Self-pay | Admitting: Gastroenterology

## 2012-03-14 VITALS — BP 126/68 | HR 72 | Temp 96.4°F | Resp 18 | Ht 60.0 in | Wt 193.0 lb

## 2012-03-14 DIAGNOSIS — Z86010 Personal history of colonic polyps: Secondary | ICD-10-CM | POA: Diagnosis not present

## 2012-03-14 DIAGNOSIS — Z1211 Encounter for screening for malignant neoplasm of colon: Secondary | ICD-10-CM

## 2012-03-14 DIAGNOSIS — D126 Benign neoplasm of colon, unspecified: Secondary | ICD-10-CM

## 2012-03-14 MED ORDER — SODIUM CHLORIDE 0.9 % IV SOLN: 500.0000 mL | INTRAVENOUS | Status: DC

## 2012-03-14 NOTE — Progress Notes (Signed)
Patient did not have preoperative order for IV antibiotic SSI prophylaxis. (G8918)  Patient did not experience any of the following events: a burn prior to discharge; a fall within the facility; wrong site/side/patient/procedure/implant event; or a hospital transfer or hospital admission upon discharge from the facility. (G8907)  

## 2012-03-14 NOTE — Patient Instructions (Signed)

## 2012-03-14 NOTE — Op Note (Signed)
[facility] [address] [place], [place]  [address]  COLONOSCOPY PROCEDURE REPORT PATIENT:  Taylyn, Brame  MR#:  [id number] BIRTHDATE:  1941/06/04, 70 yrs. old  GENDER:  female ENDOSCOPIST:  Judie Petit T. Russella Dar, MD, [facility]  PROCEDURE DATE:  03/14/2012 PROCEDURE:  Colonoscopy with snare polypectomy ASA CLASS:  Class II INDICATIONS:  1) surveillance and high-risk screening  2) history of pre-cancerous (adenomatous) colon polyps: 12/2009 MEDICATIONS:   MAC sedation, administered by CRNA, propofol (Diprivan) 250 mg IV DESCRIPTION OF PROCEDURE:   After the risks benefits and alternatives of the procedure were thoroughly explained, informed consent was obtained.  Digital rectal exam was performed and revealed no abnormalities.  The LB [id number] endoscope was introduced through the anus and advanced to the cecum, which was identified by both the appendix and ileocecal valve, without limitations. The quality of the prep was excellent, using MoviPrep.  The instrument was then slowly withdrawn as the colon was fully examined. <<PROCEDUREIMAGES>> FINDINGS:  A sessile polyp was found in the mid transverse colon. It was 7 mm in size. Polyp was snared, then cauterized with monopolar cautery. Retrieval was successful.  A sessile polyp was found in the descending colon. It was 7 mm in size. Polyp was snared, then cauterized with monopolar cautery. Retrieval was successful. Moderate diverticulosis was found in the sigmoid colon. Otherwise normal colonoscopy without other polyps, masses, vascular ectasias, or inflammatory changes. Retroflexed views in the rectum revealed no abnormalities. The time to cecum =  4.75 minutes. The scope was then withdrawn (time =  11.5  min) from the patient and the procedure completed.  COMPLICATIONS:  None  ENDOSCOPIC IMPRESSION: 1) 7 mm sessile polyp in the mid transverse colon 2) 7 mm sessile polyp in the descending colon 3) Moderate  diverticulosis in the sigmoid colon  RECOMMENDATIONS: 1) Await pathology results 2) High fiber diet with liberal fluid intake. 3) Repeat Colonoscopy in 5 years.  Venita Lick. Russella Dar, MD, [facility]  n. eSIGNED:   Venita Lick. Kailyn Dubie at 03/14/2012 09:34 AM  [name], [phone]

## 2012-03-15 ENCOUNTER — Telehealth: Payer: Self-pay | Admitting: *Deleted

## 2012-03-15 NOTE — Telephone Encounter (Signed)
  Follow up Call-  Call back number 03/14/2012  Post procedure Call Back phone  # [phone]  Permission to leave phone message Yes     Patient questions:  Do you have a fever, pain , or abdominal swelling? no Pain Score  0 *  Have you tolerated food without any problems? yes  Have you been able to return to your normal activities? no  Do you have any questions about your discharge instructions: Diet   no Medications  no Follow up visit  no  Do you have questions or concerns about your Care? no  Actions: * If pain score is 4 or above: No action needed, pain <4.

## 2012-03-20 ENCOUNTER — Encounter: Payer: Self-pay | Admitting: Gastroenterology

## 2012-04-18 ENCOUNTER — Other Ambulatory Visit (INDEPENDENT_AMBULATORY_CARE_PROVIDER_SITE_OTHER): Payer: Medicare Other

## 2012-04-18 DIAGNOSIS — E119 Type 2 diabetes mellitus without complications: Secondary | ICD-10-CM

## 2012-04-18 LAB — BASIC METABOLIC PANEL
CO2: 25 mEq/L (ref 19–32)
GFR: 97.78 mL/min (ref >60.00)
Glucose, Bld: 94 mg/dL (ref 70–99)
Potassium: 4.2 mEq/L (ref 3.5–5.1)
Sodium: 142 mEq/L (ref 135–145)

## 2012-04-26 ENCOUNTER — Ambulatory Visit (INDEPENDENT_AMBULATORY_CARE_PROVIDER_SITE_OTHER): Payer: Medicare Other | Admitting: Internal Medicine

## 2012-04-26 ENCOUNTER — Encounter: Payer: Self-pay | Admitting: Internal Medicine

## 2012-04-26 VITALS — BP 126/82 | HR 74 | Temp 98.4°F | Wt 173.0 lb

## 2012-04-26 DIAGNOSIS — R7309 Other abnormal glucose: Secondary | ICD-10-CM

## 2012-04-26 DIAGNOSIS — K5289 Other specified noninfective gastroenteritis and colitis: Secondary | ICD-10-CM

## 2012-04-26 DIAGNOSIS — F341 Dysthymic disorder: Secondary | ICD-10-CM

## 2012-04-26 DIAGNOSIS — G4700 Insomnia, unspecified: Secondary | ICD-10-CM | POA: Diagnosis not present

## 2012-04-26 DIAGNOSIS — K529 Noninfective gastroenteritis and colitis, unspecified: Secondary | ICD-10-CM

## 2012-04-26 DIAGNOSIS — F329 Major depressive disorder, single episode, unspecified: Secondary | ICD-10-CM

## 2012-04-26 MED ORDER — ONDANSETRON HCL 4 MG PO TABS: 4.0000 mg | ORAL_TABLET | Freq: Three times a day (TID) | ORAL | Status: AC | PRN

## 2012-04-26 NOTE — Patient Instructions (Addendum)
Hold metformin until your gastrointestinal symptoms resolve. Increase fluid intake Please complete the following lab tests before your next follow up appointment: BMET, A1c - 790.29 TSH - 244.9

## 2012-04-26 NOTE — Assessment & Plan Note (Signed)
Patient was able to taper off Restoril

## 2012-04-26 NOTE — Assessment & Plan Note (Signed)
71 year old white female with probable viral gastroenteritis. Use of Zofran for nausea and follow bland diet for 2-3 days.  Patient advised to call office if symptoms persist or worsen.

## 2012-04-26 NOTE — Assessment & Plan Note (Signed)
Patient started on citalopram after her father passed away. She recently tried to taper and noticed increased crying spells. Patient to restart 10 mg of citalopram.

## 2012-04-26 NOTE — Progress Notes (Signed)
Subjective:    [name], female    DOB: 1941/08/07, 71 y.o.   MRN: [id number]  HPI  71 year old white female with history of borderline type 2 diabetes, hypothyroidism and fibromyalgia for routine followup. Patient's weight is stable. She has not been able to adhere to a carb modified diet due to traveling to see her children and grandchildren in [place] and [place]. Her A1c is stable at 6.0.  Patient complains of episode of vomiting early this morning. She is not sure whether she may have eaten something that disagreed with her at a luncheon yesterday. Her husband notes mild nausea as well. She has had several episodes of diarrhea today.  Patient was able to stop taking temazepam. She tried tapering off citalopram but noticed change in mood (increased crying spells). She has resumed half dose or 10 mg of citalopram. Patient also stopped taking fexofenadine and pseudoephedrine over-the-counter.   Review of Systems Negative for fever,  she complains of hot flashes  Past Medical History  Diagnosis Date  . Chest pain, atypical     Negative stress test 02/2009  . Other and unspecified hyperlipidemia   . Personal history of urinary calculi   . Pain in joint, shoulder region   . Irritable bowel syndrome   . Chronic insomnia   . GERD (gastroesophageal reflux disease)   . History of colonic polyps   . Fibromyalgia   . Hypothyroidism   . Other abnormal glucose   . Stress incontinence, female   . Macular degeneration   . Cluster headaches     history of migraines  . Meniscus tear     history of right knee torn meniscus  . Asthma     hx of -no inhalers  . Arthritis   . Kidney stone   . Cataract     History   Social History  . Marital Status: Married    Spouse Name: N/A    Number of Children: N/A  . Years of Education: N/A   Occupational History  . Not on file.   Social History Main Topics  . Smoking status: Never Smoker   . Smokeless tobacco: Never  Used  . Alcohol Use: No  . Drug Use: No  . Sexually Active: Not on file   Other Topics Concern  . 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 Appendectomy   . Cholecystectomy   . Abdominal hysterectomy   . Tonsillectomy   . Eye surgery     to repair macular hole  . Cataract extraction, bilateral   . Knee arthroscopy     > 10 years ago  . Joint replacement 03/15/11    left knee replacement  . Colonoscopy   . Ganglion cyst excision     rt foot  . Foot arthroplasty     lt   . Mass excision 11/04/2011    Procedure: EXCISION MASS;  Surgeon: Wyn Forster., MD;  Location: [facility];  Service: Orthopedics;  Laterality: Right;  excisional biopsy right ulna mass  . Hemorrhoid surgery     03/1993  . Breast cyst aspiration     9 cysts  . Toe surgery     preventative crossover toe surg/right foot  . Toe surgery     left foot/screw  in 2nd toe  . Upper gastrointestinal endoscopy   . Skin tags removed     breast, panty line, neckline    Family History  Problem Relation Age of Onset  . Stroke Mother     died age 77  . Diabetes Mother   . Breast cancer Mother   . Breast cancer Sister   . Breast cancer Paternal Aunt   . Diabetes Maternal Grandfather   . Breast cancer Paternal Grandmother   . Breast cancer Paternal Aunt     No Known Allergies  Current Outpatient Prescriptions on File Prior to Visit  Medication Sig Dispense Refill  . amoxicillin (AMOXIL) 500 MG capsule Take 4 caps 30-60 minutes prior to dental procedure  4 capsule  0  . butalbital-acetaminophen-caffeine (FIORICET WITH CODEINE) 50-325-40-30 MG per capsule Take 1 capsule by mouth every 4 (four) hours as needed.        . calcium carbonate (OS-CAL) 600 MG TABS Take 600 mg by mouth 2 (two) times daily with a meal.        . citalopram (CELEXA) 20 MG  tablet Take 10 mg by mouth daily.      . Coenzyme Q10 150 MG CAPS Take 150 mg by mouth daily.        . diazepam (VALIUM) 5 MG tablet Take 1 tablet (5 mg total) by mouth every 6 (six) hours as needed.  30 tablet  3  . diclofenac sodium (VOLTAREN) 1 % GEL Apply 1 application topically as needed.      . docusate sodium (STOOL SOFTENER) 100 MG capsule Take 100 mg by mouth 3 (three) times daily as needed.        Marland Kitchen esomeprazole (NEXIUM) 40 MG capsule Take 40 mg by mouth daily.      . meloxicam (MOBIC) 7.5 MG tablet One tablet by mouth once daily only as needed for pain.  90 tablet  1  . metFORMIN (GLUCOPHAGE) 500 MG tablet Take 1 tablet (500 mg total) by mouth 2 (two) times daily with a meal.  180 tablet  1  . Multiple Vitamin (MULTIVITAMIN PO) Take by mouth daily.        Marland Kitchen omega-3 acid ethyl esters (LOVAZA) 1 G capsule Take 1 g by mouth 2 (two) times daily.      . pregabalin (LYRICA) 75 MG capsule Take 75 mg by mouth daily.      . Probiotic Product (ACIDOPHILUS PROBIOTIC BLEND) CAPS Take by mouth daily.        . simvastatin (ZOCOR) 10 MG tablet Take 1 tablet (10 mg total) by mouth at bedtime.  90 tablet  1  . temazepam (RESTORIL) 7.5 MG capsule Take 1 capsule (7.5 mg total) by mouth at bedtime as needed.  90 capsule  0  . DISCONTD: levothyroxine (SYNTHROID, LEVOTHROID) 50 MCG tablet Take 1 tablet (50 mcg total) by mouth daily.  90 tablet  1  . DISCONTD: solifenacin (VESICARE) 5 MG tablet Take 1 tablet (5 mg total) by mouth daily.  90 tablet  1    BP 126/82  Pulse 74  Temp 98.4 F (36.9 C) (Oral)  Wt 173 lb (78.472 kg)       Objective:   Physical Exam  Constitutional: She is oriented to person, place, and time. She appears well-developed and well-nourished.  Cardiovascular: Normal rate, regular rhythm and normal heart sounds.   No murmur heard. Pulmonary/Chest: Effort normal and breath sounds normal. She has no wheezes. She has no rales.  Musculoskeletal:  She exhibits no edema.    Neurological: She is alert and oriented to person, place, and time.  Skin: Skin is warm and dry.  Psychiatric: She has a normal mood and affect. Her behavior is normal.          Assessment & Plan:

## 2012-04-26 NOTE — Assessment & Plan Note (Signed)
Hemoglobin A1c is stable at 6.0. Hold metformin while she is experiencing gastroenteritis and resume once her symptoms have resolved.

## 2012-05-02 ENCOUNTER — Other Ambulatory Visit: Payer: Self-pay | Admitting: *Deleted

## 2012-05-02 MED ORDER — SIMVASTATIN 10 MG PO TABS: 10.0000 mg | ORAL_TABLET | Freq: Every day | ORAL | Status: DC

## 2012-05-02 MED ORDER — LEVOTHYROXINE SODIUM 25 MCG PO TABS: 25.0000 ug | ORAL_TABLET | Freq: Every day | ORAL | Status: DC

## 2012-05-02 MED ORDER — SOLIFENACIN SUCCINATE 5 MG PO TABS: 2.5000 mg | ORAL_TABLET | Freq: Every day | ORAL | Status: DC

## 2012-05-02 MED ORDER — ESOMEPRAZOLE MAGNESIUM 40 MG PO CPDR: 40.0000 mg | DELAYED_RELEASE_CAPSULE | Freq: Every day | ORAL | Status: DC

## 2012-05-02 MED ORDER — MELOXICAM 7.5 MG PO TABS: ORAL_TABLET | ORAL | Status: AC

## 2012-05-02 MED ORDER — METFORMIN HCL 500 MG PO TABS: 500.0000 mg | ORAL_TABLET | Freq: Two times a day (BID) | ORAL | Status: DC

## 2012-05-02 MED ORDER — CITALOPRAM HYDROBROMIDE 20 MG PO TABS: 10.0000 mg | ORAL_TABLET | Freq: Every day | ORAL | Status: DC

## 2012-05-04 ENCOUNTER — Telehealth: Payer: Self-pay | Admitting: Internal Medicine

## 2012-05-04 NOTE — Telephone Encounter (Signed)
Questions answered.

## 2012-05-04 NOTE — Telephone Encounter (Signed)
Phar has ?'s Concerning celexa and vesicare reference [id number]

## 2012-05-11 DIAGNOSIS — M79609 Pain in unspecified limb: Secondary | ICD-10-CM | POA: Diagnosis not present

## 2012-05-11 DIAGNOSIS — B351 Tinea unguium: Secondary | ICD-10-CM | POA: Diagnosis not present

## 2012-06-02 DIAGNOSIS — L659 Nonscarring hair loss, unspecified: Secondary | ICD-10-CM | POA: Diagnosis not present

## 2012-06-02 DIAGNOSIS — E669 Obesity, unspecified: Secondary | ICD-10-CM | POA: Diagnosis not present

## 2012-06-02 DIAGNOSIS — R7309 Other abnormal glucose: Secondary | ICD-10-CM | POA: Diagnosis not present

## 2012-06-02 DIAGNOSIS — L680 Hirsutism: Secondary | ICD-10-CM | POA: Diagnosis not present

## 2012-06-02 DIAGNOSIS — Z803 Family history of malignant neoplasm of breast: Secondary | ICD-10-CM | POA: Diagnosis not present

## 2012-06-12 DIAGNOSIS — H109 Unspecified conjunctivitis: Secondary | ICD-10-CM | POA: Diagnosis not present

## 2012-06-27 ENCOUNTER — Other Ambulatory Visit: Payer: Self-pay | Admitting: *Deleted

## 2012-06-27 MED ORDER — TEMAZEPAM 7.5 MG PO CAPS: 7.5000 mg | ORAL_CAPSULE | Freq: Every evening | ORAL | Status: DC | PRN

## 2012-06-27 MED ORDER — METFORMIN HCL 500 MG PO TABS: 500.0000 mg | ORAL_TABLET | Freq: Two times a day (BID) | ORAL | Status: DC

## 2012-06-27 MED ORDER — PREGABALIN 75 MG PO CAPS: 75.0000 mg | ORAL_CAPSULE | Freq: Two times a day (BID) | ORAL | Status: DC

## 2012-06-28 ENCOUNTER — Other Ambulatory Visit: Payer: Self-pay | Admitting: Internal Medicine

## 2012-06-28 DIAGNOSIS — R7309 Other abnormal glucose: Secondary | ICD-10-CM | POA: Diagnosis not present

## 2012-06-28 DIAGNOSIS — E669 Obesity, unspecified: Secondary | ICD-10-CM | POA: Diagnosis not present

## 2012-06-28 DIAGNOSIS — Z1231 Encounter for screening mammogram for malignant neoplasm of breast: Secondary | ICD-10-CM

## 2012-06-29 ENCOUNTER — Ambulatory Visit (HOSPITAL_BASED_OUTPATIENT_CLINIC_OR_DEPARTMENT_OTHER)
Admission: RE | Admit: 2012-06-29 | Discharge: 2012-06-29 | Disposition: A | Discharge status: Home / Self Care | Payer: Medicare Other | Source: Ambulatory Visit | Attending: Internal Medicine | Admitting: Internal Medicine

## 2012-06-29 DIAGNOSIS — Z1231 Encounter for screening mammogram for malignant neoplasm of breast: Secondary | ICD-10-CM | POA: Diagnosis not present

## 2012-07-03 ENCOUNTER — Telehealth: Payer: Self-pay | Admitting: Internal Medicine

## 2012-07-03 NOTE — Telephone Encounter (Signed)
Caller: Lawrence/Spouse; Patient Name: Jean Nash; PCP: Artist Pais Doe-Hyun [name]); Best Callback Phone Number: [phone].  Caller reports with symptoms for 1 week:  intermittent burning, frequency, foul smell.  Pt is afebrile.  All emergent symptoms of Urinary Symptoms - Female Protocol ruled out with exception to 'Has one or more urinary tract symptoms and has not been previously evaluated'.  Home care advice given and appt scheduled for 07/04/12 at 2:00 with Dr Artist Pais

## 2012-07-04 ENCOUNTER — Encounter: Payer: Self-pay | Admitting: Internal Medicine

## 2012-07-04 ENCOUNTER — Ambulatory Visit (INDEPENDENT_AMBULATORY_CARE_PROVIDER_SITE_OTHER): Payer: Medicare Other | Admitting: Internal Medicine

## 2012-07-04 VITALS — BP 140/84 | Temp 98.2°F | Wt 191.0 lb

## 2012-07-04 DIAGNOSIS — N390 Urinary tract infection, site not specified: Secondary | ICD-10-CM | POA: Diagnosis not present

## 2012-07-04 DIAGNOSIS — R3911 Hesitancy of micturition: Secondary | ICD-10-CM | POA: Diagnosis not present

## 2012-07-04 LAB — POCT URINALYSIS DIPSTICK
Bilirubin, UA: NEGATIVE
Blood, UA: NEGATIVE
Ketones, UA: NEGATIVE
Spec Grav, UA: 1.01
pH, UA: 7.5

## 2012-07-04 MED ORDER — CIPROFLOXACIN HCL 250 MG PO TABS: 250.0000 mg | ORAL_TABLET | Freq: Two times a day (BID) | ORAL | Status: AC

## 2012-07-04 MED ORDER — METFORMIN HCL 1000 MG PO TABS: 1000.0000 mg | ORAL_TABLET | Freq: Two times a day (BID) | ORAL | Status: DC

## 2012-07-04 NOTE — Patient Instructions (Addendum)
Please call our office if your symptoms do not improve or gets worse.  

## 2012-07-04 NOTE — Progress Notes (Signed)
Subjective:    [name], female    DOB: Jan 30, 1941, 71 y.o.   MRN: [id number]  Dysuria  This is a new problem. The current episode started 1 to 4 weeks ago. The problem occurs intermittently. The problem has been gradually improving. The quality of the pain is described as burning. There has been no fever. She is sexually active. Associated symptoms include urgency. Pertinent negatives include no chills, discharge or hematuria. She has tried increased fluids for the symptoms. The treatment provided mild relief. Her past medical history is significant for kidney stones and a urological procedure.      Review of Systems  Constitutional: Negative for chills.  Genitourinary: Positive for dysuria and urgency. Negative for hematuria.   Past Medical History  Diagnosis Date  . Chest pain, atypical     Negative stress test 02/2009  . Other and unspecified hyperlipidemia   . Personal history of urinary calculi   . Pain in joint, shoulder region   . Irritable bowel syndrome   . Chronic insomnia   . GERD (gastroesophageal reflux disease)   . History of colonic polyps   . Fibromyalgia   . Hypothyroidism   . Other abnormal glucose   . Stress incontinence, female   . Macular degeneration   . Cluster headaches     history of migraines  . Meniscus tear     history of right knee torn meniscus  . Asthma     hx of -no inhalers  . Arthritis   . Kidney stone   . Cataract     History   Social History  . Marital Status: Married    Spouse Name: N/A    Number of Children: N/A  . Years of Education: N/A   Occupational History  . Not on file.   Social History Main Topics  . Smoking status: Never Smoker   . Smokeless tobacco: Never Used  . Alcohol Use: No  . Drug Use: No  . Sexually Active: Not on file   Other Topics Concern  . 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 Appendectomy   . Cholecystectomy   . Abdominal hysterectomy   . Tonsillectomy   . Eye surgery     to repair macular hole  . Cataract extraction, bilateral   . Knee arthroscopy     > 10 years ago  . Joint replacement 03/15/11    left knee replacement  . Colonoscopy   . Ganglion cyst excision     rt foot  . Foot arthroplasty     lt   . Mass excision 11/04/2011    Procedure: EXCISION MASS;  Surgeon: Wyn Forster., MD;  Location: [facility];  Service: Orthopedics;  Laterality: Right;  excisional biopsy right ulna mass  . Hemorrhoid surgery     03/1993  . Breast cyst aspiration     9 cysts  . Toe surgery     preventative crossover toe surg/right foot  . Toe surgery     left foot/screw  in 2nd toe  . Upper gastrointestinal endoscopy   . Skin tags removed     breast, panty line, neckline    Family History  Problem Relation Age of Onset  . Stroke Mother     died  age 75  . Diabetes Mother   . Breast cancer Mother   . Breast cancer Sister   . Breast cancer Paternal Aunt   . Diabetes Maternal Grandfather   . Breast cancer Paternal Grandmother   . Breast cancer Paternal Aunt     No Known Allergies  Current Outpatient Prescriptions on File Prior to Visit  Medication Sig Dispense Refill  . amoxicillin (AMOXIL) 500 MG capsule Take 4 caps 30-60 minutes prior to dental procedure  4 capsule  0  . butalbital-acetaminophen-caffeine (FIORICET WITH CODEINE) 50-325-40-30 MG per capsule Take 1 capsule by mouth every 4 (four) hours as needed.        . calcium carbonate (OS-CAL) 600 MG TABS Take 600 mg by mouth 2 (two) times daily with a meal.        . citalopram (CELEXA) 20 MG tablet Take 0.5 tablets (10 mg total) by mouth daily.  90 tablet  3  . Coenzyme Q10 150 MG CAPS Take 150 mg by mouth daily.        . diazepam (VALIUM) 5 MG tablet Take 1 tablet (5 mg total) by mouth every 6 (six) hours as needed.  30 tablet  3  .  diclofenac sodium (VOLTAREN) 1 % GEL Apply 1 application topically as needed.      . docusate sodium (STOOL SOFTENER) 100 MG capsule Take 100 mg by mouth 3 (three) times daily as needed.        Marland Kitchen esomeprazole (NEXIUM) 40 MG capsule Take 1 capsule (40 mg total) by mouth daily.  90 capsule  3  . levothyroxine (SYNTHROID, LEVOTHROID) 25 MCG tablet Take 1 tablet (25 mcg total) by mouth daily.  90 tablet  3  . meloxicam (MOBIC) 7.5 MG tablet One tablet by mouth once daily only as needed for pain.  90 tablet  3  . Multiple Vitamin (MULTIVITAMIN PO) Take by mouth daily.        Marland Kitchen omega-3 acid ethyl esters (LOVAZA) 1 G capsule Take 1 g by mouth 2 (two) times daily.      . pregabalin (LYRICA) 75 MG capsule Take 1 capsule (75 mg total) by mouth 2 (two) times daily.  180 capsule  0  . Probiotic Product (ACIDOPHILUS PROBIOTIC BLEND) CAPS Take by mouth daily.        . simvastatin (ZOCOR) 10 MG tablet Take 1 tablet (10 mg total) by mouth at bedtime.  90 tablet  3  . solifenacin (VESICARE) 5 MG tablet Take 0.5 tablets (2.5 mg total) by mouth daily.  90 tablet  3  . temazepam (RESTORIL) 7.5 MG capsule Take 1 capsule (7.5 mg total) by mouth at bedtime as needed.  90 capsule  0  . DISCONTD: metFORMIN (GLUCOPHAGE) 500 MG tablet Take 1 tablet (500 mg total) by mouth 2 (two) times daily with a meal.  180 tablet  3    BP 140/84  Temp 98.2 F (36.8 C) (Oral)  Wt 191 lb (86.637 kg)       Objective:   Physical Exam  Constitutional: She appears well-developed and well-nourished.  Cardiovascular: Normal rate, regular rhythm and normal heart sounds.   No murmur heard. Pulmonary/Chest: Effort normal and breath sounds normal. She has no wheezes.  Abdominal: Soft. Bowel sounds are normal.       Right flank tenderness No suprapubic tenderness  Musculoskeletal: She exhibits no edema.          Assessment & Plan:

## 2012-07-04 NOTE — Assessment & Plan Note (Signed)
71 year old white female complains of intermittent dysuria and urgency for 2 weeks. Symptoms minimally improved with the increase fluid intake. She has right flank tenderness on exam. I doubt she has pyelonephritis. Treat with Cipro 250 mg twice daily x7 days. If persistent right flank tenderness we discussed obtaining renal ultrasound vs CT of abd and pelvis. Send urine for culture.

## 2012-07-07 LAB — URINE CULTURE

## 2012-07-26 DIAGNOSIS — D1801 Hemangioma of skin and subcutaneous tissue: Secondary | ICD-10-CM | POA: Diagnosis not present

## 2012-07-26 DIAGNOSIS — D235 Other benign neoplasm of skin of trunk: Secondary | ICD-10-CM | POA: Diagnosis not present

## 2012-07-27 ENCOUNTER — Ambulatory Visit (INDEPENDENT_AMBULATORY_CARE_PROVIDER_SITE_OTHER): Payer: Medicare Other

## 2012-07-27 DIAGNOSIS — Z23 Encounter for immunization: Secondary | ICD-10-CM

## 2012-08-01 DIAGNOSIS — M79609 Pain in unspecified limb: Secondary | ICD-10-CM | POA: Diagnosis not present

## 2012-08-01 DIAGNOSIS — B351 Tinea unguium: Secondary | ICD-10-CM | POA: Diagnosis not present

## 2012-09-07 DIAGNOSIS — H3530 Unspecified macular degeneration: Secondary | ICD-10-CM | POA: Diagnosis not present

## 2012-09-15 ENCOUNTER — Other Ambulatory Visit: Payer: Self-pay | Admitting: *Deleted

## 2012-09-15 MED ORDER — PREGABALIN 75 MG PO CAPS: 75.0000 mg | ORAL_CAPSULE | Freq: Two times a day (BID) | ORAL | Status: DC

## 2012-09-15 MED ORDER — TEMAZEPAM 7.5 MG PO CAPS: 7.5000 mg | ORAL_CAPSULE | Freq: Every evening | ORAL | Status: DC | PRN

## 2012-09-27 ENCOUNTER — Telehealth: Payer: Self-pay | Admitting: Internal Medicine

## 2012-09-27 ENCOUNTER — Encounter: Payer: Self-pay | Admitting: Internal Medicine

## 2012-09-27 MED ORDER — HYDROCODONE-ACETAMINOPHEN 7.5-500 MG PO TABS: ORAL_TABLET | ORAL | Status: DC

## 2012-09-27 MED ORDER — BUTALBITAL-APAP-CAFFEINE 50-500-40 MG PO TABS: 1.0000 | ORAL_TABLET | Freq: Three times a day (TID) | ORAL | Status: DC | PRN

## 2012-09-27 MED ORDER — BUTALBITAL-APAP-CAFF-COD 50-325-40-30 MG PO CAPS: 1.0000 | ORAL_CAPSULE | ORAL | Status: DC | PRN

## 2012-09-27 NOTE — Addendum Note (Signed)
Addended by: Meda Coffee on: 09/27/2012 01:19 PM   Modules accepted: Orders, Medications

## 2012-09-27 NOTE — Addendum Note (Signed)
Addended by: Alfred Levins D on: 09/27/2012 04:49 PM   Modules accepted: Orders

## 2012-09-27 NOTE — Telephone Encounter (Signed)
Documented in wrong chart

## 2012-09-27 NOTE — Telephone Encounter (Signed)
rx's faxed to [facility]

## 2012-09-27 NOTE — Telephone Encounter (Signed)
Please call pt.  We refilled maxalt and effexor xr.  There is potential drug interaction between two medications.  When she takes maxalt, I suggest she skip her dose of effexor xr.

## 2012-09-27 NOTE — Telephone Encounter (Signed)
Jean Nash,  I sent rx for fioricet.  Can you phone in rx for vicodin.  I already updated medication list

## 2012-09-28 ENCOUNTER — Other Ambulatory Visit: Payer: Self-pay | Admitting: *Deleted

## 2012-09-28 MED ORDER — BUTALBITAL-APAP-CAFFEINE 50-300-40 MG PO CAPS: 1.0000 | ORAL_CAPSULE | ORAL | Status: DC | PRN

## 2012-10-04 ENCOUNTER — Encounter: Payer: Self-pay | Admitting: Internal Medicine

## 2012-10-04 ENCOUNTER — Telehealth: Payer: Self-pay | Admitting: Internal Medicine

## 2012-10-04 ENCOUNTER — Ambulatory Visit (INDEPENDENT_AMBULATORY_CARE_PROVIDER_SITE_OTHER): Payer: Medicare Other | Admitting: Internal Medicine

## 2012-10-04 VITALS — BP 122/74 | Temp 97.9°F | Wt 183.0 lb

## 2012-10-04 DIAGNOSIS — G44009 Cluster headache syndrome, unspecified, not intractable: Secondary | ICD-10-CM | POA: Diagnosis not present

## 2012-10-04 MED ORDER — METHOCARBAMOL 500 MG PO TABS: 500.0000 mg | ORAL_TABLET | Freq: Three times a day (TID) | ORAL | Status: DC | PRN

## 2012-10-04 MED ORDER — HYDROCODONE-ACETAMINOPHEN 7.5-500 MG PO TABS: ORAL_TABLET | ORAL | Status: DC

## 2012-10-04 NOTE — Telephone Encounter (Signed)
Pharmacist called and said that they are out of HYDROcodone-acetaminophen (LORTAB) 7.5-500 MG per tablet. Req to change to 7.5-325mg  instead.

## 2012-10-04 NOTE — Assessment & Plan Note (Signed)
71 year old white female with signs and symptoms consistent with cluster headache. She may also have component of tension headache. Trial of muscle relaxers and hydrocodone. If persistent symptoms consider trial of triptan.

## 2012-10-04 NOTE — Progress Notes (Signed)
Subjective:    [name], female    DOB: 10-Aug-1941, 71 y.o.   MRN: [id number]  HPI  71 year old white female complains of headache for the last 2-3 weeks. She has had similar headaches in the past. She reports a random stabbing pains in right temple area and occipital area. She reports that her eyes are tearing and she has pain around her right eye. She's had previous workup by neurologist while she was living in [place]. She is not sure whether she was diagnosed with cluster headaches. She had either a CT scan or MRI of brain that was negative. She tried to take fioricet but had no improvement. Severity can range from 2-8/10. She does not have any associated nausea or photophobia.   Review of Systems Negative for visual changes, no sinus congestion  Past Medical History  Diagnosis Date  . Chest pain, atypical     Negative stress test 02/2009  . Other and unspecified hyperlipidemia   . Personal history of urinary calculi   . Pain in joint, shoulder region   . Irritable bowel syndrome   . Chronic insomnia   . GERD (gastroesophageal reflux disease)   . History of colonic polyps   . Fibromyalgia   . Hypothyroidism   . Other abnormal glucose   . Stress incontinence, female   . Macular degeneration   . Cluster headaches     history of migraines  . Meniscus tear     history of right knee torn meniscus  . Asthma     hx of -no inhalers  . Arthritis   . Kidney stone   . Cataract     History   Social History  . Marital Status: Married    Spouse Name: N/A    Number of Children: N/A  . Years of Education: N/A   Occupational History  . Not on file.   Social History Main Topics  . Smoking status: Never Smoker   . Smokeless tobacco: Never Used  . Alcohol Use: No  . Drug Use: No  . Sexually Active: Not on file   Other Topics Concern  . 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 Appendectomy   . Cholecystectomy   . Abdominal hysterectomy   . Tonsillectomy   . Eye surgery     to repair macular hole  . Cataract extraction, bilateral   . Knee arthroscopy     > 10 years ago  . Joint replacement 03/15/11    left knee replacement  . Colonoscopy   . Ganglion cyst excision     rt foot  . Foot arthroplasty     lt   . Mass excision 11/04/2011    Procedure: EXCISION MASS;  Surgeon: Wyn Forster., MD;  Location: [facility];  Service: Orthopedics;  Laterality: Right;  excisional biopsy right ulna mass  . Hemorrhoid surgery     03/1993  . Breast cyst aspiration     9 cysts  . Toe surgery     preventative crossover toe surg/right foot  . Toe surgery     left foot/screw  in 2nd toe  . Upper gastrointestinal endoscopy   . Skin tags removed     breast, panty line, neckline    Family History  Problem Relation Age of Onset  . Stroke Mother     died age 46  . Diabetes Mother   . Breast cancer Mother   . Breast cancer Sister   . Breast cancer Paternal Aunt   . Diabetes Maternal Grandfather   . Breast cancer Paternal Grandmother   . Breast cancer Paternal Aunt     No Known Allergies  Current Outpatient Prescriptions on File Prior to Visit  Medication Sig Dispense Refill  . amoxicillin (AMOXIL) 500 MG capsule Take 4 caps 30-60 minutes prior to dental procedure  4 capsule  0  . calcium carbonate (OS-CAL) 600 MG TABS Take 600 mg by mouth 2 (two) times daily with a meal.        . citalopram (CELEXA) 20 MG tablet Take 0.5 tablets (10 mg total) by mouth daily.  90 tablet  3  . Coenzyme Q10 150 MG CAPS Take 150 mg by mouth daily.        . diazepam (VALIUM) 5 MG tablet Take 1 tablet (5 mg total) by mouth every 6 (six) hours as needed.  30 tablet  3  . diclofenac sodium (VOLTAREN) 1 % GEL Apply 1 application topically as needed.      . docusate sodium (STOOL SOFTENER) 100 MG  capsule Take 100 mg by mouth 3 (three) times daily as needed.        Marland Kitchen esomeprazole (NEXIUM) 40 MG capsule Take 1 capsule (40 mg total) by mouth daily.  90 capsule  3  . levothyroxine (SYNTHROID, LEVOTHROID) 25 MCG tablet Take 1 tablet (25 mcg total) by mouth daily.  90 tablet  3  . meloxicam (MOBIC) 7.5 MG tablet One tablet by mouth once daily only as needed for pain.  90 tablet  3  . metFORMIN (GLUCOPHAGE) 1000 MG tablet Take 1 tablet (1,000 mg total) by mouth 2 (two) times daily with a meal.  180 tablet  3  . Multiple Vitamin (MULTIVITAMIN PO) Take by mouth daily.        Marland Kitchen omega-3 acid ethyl esters (LOVAZA) 1 G capsule Take 1 g by mouth 2 (two) times daily.      . pregabalin (LYRICA) 75 MG capsule Take 1 capsule (75 mg total) by mouth 2 (two) times daily.  180 capsule  1  . Probiotic Product (ACIDOPHILUS PROBIOTIC BLEND) CAPS Take by mouth daily.        . simvastatin (ZOCOR) 10 MG tablet Take 1 tablet (10 mg total) by mouth at bedtime.  90 tablet  3  . temazepam (RESTORIL) 7.5 MG capsule Take 1 capsule (7.5 mg total) by mouth at bedtime as needed.  90 capsule  1    BP 122/74  Temp 97.9 F (36.6 C) (Oral)  Wt 183 lb (83.008 kg)       Objective:   Physical Exam  Constitutional: She is oriented to person, place, and time. She appears well-developed and well-nourished.  HENT:  Head: Normocephalic.  Right Ear: External ear normal.  Left Ear: External ear normal.  Mouth/Throat: Oropharynx is clear and moist.  Eyes: EOM are normal. Pupils are equal, round, and reactive to light.       No defects in peripheral vision No nystagmus  Neck: Neck supple.  Cardiovascular: Normal rate, regular rhythm and normal heart sounds.   Pulmonary/Chest: Effort normal and breath sounds normal. She has no wheezes.  Musculoskeletal: She exhibits no edema.  Lymphadenopathy:    She has no cervical adenopathy.  Neurological: She is alert  and oriented to person, place, and time.       Mild tenderness near  right occiput  Skin: Skin is warm and dry.  Psychiatric: She has a normal mood and affect. Her behavior is normal.          Assessment & Plan:

## 2012-10-04 NOTE — Telephone Encounter (Signed)
Ok to change to 7.5/325

## 2012-10-05 NOTE — Telephone Encounter (Signed)
Pharmacy notified.

## 2012-10-05 NOTE — Telephone Encounter (Signed)
Pt would like to p/u script at noon. Pharm called to check on status. Thanks!

## 2012-10-20 ENCOUNTER — Other Ambulatory Visit: Payer: Medicare Other

## 2012-10-24 ENCOUNTER — Ambulatory Visit: Payer: Medicare Other | Admitting: Internal Medicine

## 2012-10-25 ENCOUNTER — Encounter: Payer: Self-pay | Admitting: Internal Medicine

## 2012-10-25 ENCOUNTER — Ambulatory Visit (INDEPENDENT_AMBULATORY_CARE_PROVIDER_SITE_OTHER): Payer: Medicare Other | Admitting: Internal Medicine

## 2012-10-25 VITALS — BP 126/80 | HR 72 | Temp 98.3°F | Resp 16 | Ht 60.0 in | Wt 187.0 lb

## 2012-10-25 DIAGNOSIS — H01009 Unspecified blepharitis unspecified eye, unspecified eyelid: Secondary | ICD-10-CM

## 2012-10-25 DIAGNOSIS — H01006 Unspecified blepharitis left eye, unspecified eyelid: Secondary | ICD-10-CM

## 2012-10-25 MED ORDER — ERYTHROMYCIN 5 MG/GM OP OINT: TOPICAL_OINTMENT | Freq: Four times a day (QID) | OPHTHALMIC | Status: AC

## 2012-10-25 MED ORDER — BUTALBITAL-APAP-CAFFEINE 50-500-40 MG PO TABS: 1.0000 | ORAL_TABLET | Freq: Three times a day (TID) | ORAL | Status: DC | PRN

## 2012-10-25 NOTE — Assessment & Plan Note (Signed)
71 year old white female with signs and symptoms of blepharitis in her left eye. Treat with erythromycin ointment every 6 hours for 7 days. Patient also advised to use warm compress.  Patient advised to call office if symptoms persist or worsen.

## 2012-10-25 NOTE — Patient Instructions (Addendum)
Use warm compress to left eye as directed Please call our office if your symptoms do not improve or gets worse.

## 2012-10-25 NOTE — Progress Notes (Signed)
Subjective:    [name], female    DOB: [birth year], 71 y.o.   MRN: [id number]  HPI  71 year old white female complains of redness and swelling in her left eye.  He symptoms started 3 days ago. Her eyelid is swollen and irritated. She denies any changes in vision. Patient notes she recently restarted an older prescription for antihistamine eyedrop.   Review of Systems Negative for changes in vision or eye pain.    Past Medical History  Diagnosis Date  . Chest pain, atypical     Negative stress test 02/2009  . Other and unspecified hyperlipidemia   . Personal history of urinary calculi   . Pain in joint, shoulder region   . Irritable bowel syndrome   . Chronic insomnia   . GERD (gastroesophageal reflux disease)   . History of colonic polyps   . Fibromyalgia   . Hypothyroidism   . Other abnormal glucose   . Stress incontinence, female   . Macular degeneration   . Cluster headaches     history of migraines  . Meniscus tear     history of right knee torn meniscus  . Asthma     hx of -no inhalers  . Arthritis   . Kidney stone   . Cataract     History   Social History  . Marital Status: Married    Spouse Name: N/A    Number of Children: N/A  . Years of Education: N/A   Occupational History  . Not on file.   Social History Main Topics  . Smoking status: Never Smoker   . Smokeless tobacco: Never Used  . Alcohol Use: No  . Drug Use: No  . Sexually Active: Not on file   Other Topics Concern  . 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 Appendectomy   . Cholecystectomy   . Abdominal hysterectomy   . Tonsillectomy   . Eye surgery     to repair macular hole  . Cataract extraction, bilateral   . Knee arthroscopy     > 10 years ago  . Joint replacement 03/15/11    left knee replacement  . Colonoscopy    . Ganglion cyst excision     rt foot  . Foot arthroplasty     lt   . Mass excision 11/04/2011    Procedure: EXCISION MASS;  Surgeon: Wyn Forster., MD;  Location: [facility];  Service: Orthopedics;  Laterality: Right;  excisional biopsy right ulna mass  . Hemorrhoid surgery     03/1993  . Breast cyst aspiration     9 cysts  . Toe surgery     preventative crossover toe surg/right foot  . Toe surgery     left foot/screw  in 2nd toe  . Upper gastrointestinal endoscopy   . Skin tags removed     breast, panty line, neckline    Family History  Problem Relation Age of Onset  . Stroke Mother     died age 35  . Diabetes Mother   . Breast cancer Mother   . Breast cancer Sister   . Breast cancer Paternal Aunt   . Diabetes Maternal Grandfather   . Breast cancer Paternal Grandmother   . Breast cancer Paternal Aunt  No Known Allergies  Current Outpatient Prescriptions on File Prior to Visit  Medication Sig Dispense Refill  . amoxicillin (AMOXIL) 500 MG capsule Take 4 caps 30-60 minutes prior to dental procedure  4 capsule  0  . calcium carbonate (OS-CAL) 600 MG TABS Take 600 mg by mouth 2 (two) times daily with a meal.        . citalopram (CELEXA) 20 MG tablet Take 0.5 tablets (10 mg total) by mouth daily.  90 tablet  3  . Coenzyme Q10 150 MG CAPS Take 150 mg by mouth daily.        . diazepam (VALIUM) 5 MG tablet Take 1 tablet (5 mg total) by mouth every 6 (six) hours as needed.  30 tablet  3  . diclofenac sodium (VOLTAREN) 1 % GEL Apply 1 application topically as needed.      . docusate sodium (STOOL SOFTENER) 100 MG capsule Take 100 mg by mouth 3 (three) times daily as needed.        Marland Kitchen esomeprazole (NEXIUM) 40 MG capsule Take 1 capsule (40 mg total) by mouth daily.  90 capsule  3  . HYDROcodone-acetaminophen (LORTAB) 7.5-500 MG per tablet 1/2 to one tablet every 8 hrs as needed  30 tablet  0  . levothyroxine (SYNTHROID, LEVOTHROID) 25 MCG tablet Take 1  tablet (25 mcg total) by mouth daily.  90 tablet  3  . meloxicam (MOBIC) 7.5 MG tablet One tablet by mouth once daily only as needed for pain.  90 tablet  3  . metFORMIN (GLUCOPHAGE) 1000 MG tablet Take 1 tablet (1,000 mg total) by mouth 2 (two) times daily with a meal.  180 tablet  3  . methocarbamol (ROBAXIN) 500 MG tablet Take 1 tablet (500 mg total) by mouth 3 (three) times daily as needed.  30 tablet  0  . Multiple Vitamin (MULTIVITAMIN PO) Take by mouth daily.        Marland Kitchen omega-3 acid ethyl esters (LOVAZA) 1 G capsule Take 1 g by mouth 2 (two) times daily.      . pregabalin (LYRICA) 75 MG capsule Take 1 capsule (75 mg total) by mouth 2 (two) times daily.  180 capsule  1  . Probiotic Product (ACIDOPHILUS PROBIOTIC BLEND) CAPS Take by mouth daily.        . simvastatin (ZOCOR) 10 MG tablet Take 1 tablet (10 mg total) by mouth at bedtime.  90 tablet  3  . temazepam (RESTORIL) 7.5 MG capsule Take 1 capsule (7.5 mg total) by mouth at bedtime as needed.  90 capsule  1    BP 126/80  Pulse 72  Temp 98.3 F (36.8 C)  Resp 16  Ht 5' (1.524 m)  Wt 187 lb (84.823 kg)  BMI 36.52 kg/m2    Objective:   Physical Exam  Constitutional: She appears well-developed and well-nourished.  HENT:  Head: Normocephalic and atraumatic.  Right Ear: External ear normal.  Left Ear: External ear normal.  Eyes: EOM are normal. Pupils are equal, round, and reactive to light.       Swelling and redness of left upper eyelid, mild left conjunctivitis  Cardiovascular: Normal rate, regular rhythm and normal heart sounds.   Pulmonary/Chest: Effort normal and breath sounds normal. She has no wheezes.          Assessment & Plan:

## 2012-10-26 DIAGNOSIS — IMO0002 Reserved for concepts with insufficient information to code with codable children: Secondary | ICD-10-CM | POA: Diagnosis not present

## 2012-10-26 DIAGNOSIS — M1710 Unilateral primary osteoarthritis, unspecified knee: Secondary | ICD-10-CM | POA: Diagnosis not present

## 2012-10-27 ENCOUNTER — Ambulatory Visit: Payer: Medicare Other | Admitting: Internal Medicine

## 2012-11-02 DIAGNOSIS — B351 Tinea unguium: Secondary | ICD-10-CM | POA: Diagnosis not present

## 2012-11-02 DIAGNOSIS — M79609 Pain in unspecified limb: Secondary | ICD-10-CM | POA: Diagnosis not present

## 2012-11-02 DIAGNOSIS — M19079 Primary osteoarthritis, unspecified ankle and foot: Secondary | ICD-10-CM | POA: Diagnosis not present

## 2012-11-19 ENCOUNTER — Encounter: Payer: Self-pay | Admitting: Internal Medicine

## 2012-11-19 DIAGNOSIS — M8580 Other specified disorders of bone density and structure, unspecified site: Secondary | ICD-10-CM

## 2012-11-22 ENCOUNTER — Other Ambulatory Visit (INDEPENDENT_AMBULATORY_CARE_PROVIDER_SITE_OTHER): Payer: Medicare Other

## 2012-11-22 DIAGNOSIS — E039 Hypothyroidism, unspecified: Secondary | ICD-10-CM | POA: Diagnosis not present

## 2012-11-22 DIAGNOSIS — H109 Unspecified conjunctivitis: Secondary | ICD-10-CM | POA: Diagnosis not present

## 2012-11-22 DIAGNOSIS — R7309 Other abnormal glucose: Secondary | ICD-10-CM | POA: Diagnosis not present

## 2012-11-22 LAB — BASIC METABOLIC PANEL
Calcium: 9.3 mg/dL (ref 8.4–10.5)
Creatinine, Ser: 0.6 mg/dL (ref 0.4–1.2)
GFR: 97.61 mL/min (ref >60.00)
Sodium: 140 mEq/L (ref 135–145)

## 2012-11-22 LAB — TSH: TSH: 5.33 u[IU]/mL (ref 0.35–5.50)

## 2012-11-22 LAB — HEMOGLOBIN A1C: Hgb A1c MFr Bld: 5.9 % (ref 4.6–6.5)

## 2012-11-23 ENCOUNTER — Ambulatory Visit (INDEPENDENT_AMBULATORY_CARE_PROVIDER_SITE_OTHER)
Admission: RE | Admit: 2012-11-23 | Discharge: 2012-11-23 | Disposition: A | Discharge status: Home / Self Care | Payer: Medicare Other | Source: Ambulatory Visit | Attending: Internal Medicine | Admitting: Internal Medicine

## 2012-11-23 DIAGNOSIS — M949 Disorder of cartilage, unspecified: Secondary | ICD-10-CM | POA: Diagnosis not present

## 2012-11-23 DIAGNOSIS — M899 Disorder of bone, unspecified: Secondary | ICD-10-CM | POA: Diagnosis not present

## 2012-11-23 DIAGNOSIS — M8580 Other specified disorders of bone density and structure, unspecified site: Secondary | ICD-10-CM

## 2012-11-29 ENCOUNTER — Encounter: Payer: Self-pay | Admitting: Internal Medicine

## 2012-11-29 ENCOUNTER — Ambulatory Visit (INDEPENDENT_AMBULATORY_CARE_PROVIDER_SITE_OTHER): Payer: Medicare Other | Admitting: Internal Medicine

## 2012-11-29 VITALS — BP 122/80 | HR 72 | Temp 98.0°F | Wt 183.0 lb

## 2012-11-29 DIAGNOSIS — R7309 Other abnormal glucose: Secondary | ICD-10-CM

## 2012-11-29 DIAGNOSIS — Z23 Encounter for immunization: Secondary | ICD-10-CM | POA: Diagnosis not present

## 2012-11-29 DIAGNOSIS — M25569 Pain in unspecified knee: Secondary | ICD-10-CM | POA: Diagnosis not present

## 2012-11-29 DIAGNOSIS — R51 Headache: Secondary | ICD-10-CM | POA: Diagnosis not present

## 2012-11-29 DIAGNOSIS — G44009 Cluster headache syndrome, unspecified, not intractable: Secondary | ICD-10-CM

## 2012-11-29 DIAGNOSIS — E039 Hypothyroidism, unspecified: Secondary | ICD-10-CM

## 2012-11-29 DIAGNOSIS — M778 Other enthesopathies, not elsewhere classified: Secondary | ICD-10-CM | POA: Insufficient documentation

## 2012-11-29 DIAGNOSIS — H01006 Unspecified blepharitis left eye, unspecified eyelid: Secondary | ICD-10-CM

## 2012-11-29 DIAGNOSIS — H01009 Unspecified blepharitis unspecified eye, unspecified eyelid: Secondary | ICD-10-CM | POA: Diagnosis not present

## 2012-11-29 DIAGNOSIS — M25562 Pain in left knee: Secondary | ICD-10-CM | POA: Insufficient documentation

## 2012-11-29 DIAGNOSIS — M6580 Other synovitis and tenosynovitis, unspecified site: Secondary | ICD-10-CM

## 2012-11-29 MED ORDER — ALBUTEROL SULFATE HFA 108 (90 BASE) MCG/ACT IN AERS: 2.0000 | INHALATION_SPRAY | Freq: Four times a day (QID) | RESPIRATORY_TRACT | Status: DC | PRN

## 2012-11-29 MED ORDER — SOLIFENACIN SUCCINATE 5 MG PO TABS: 5.0000 mg | ORAL_TABLET | Freq: Every day | ORAL | Status: DC

## 2012-11-29 MED ORDER — LEVOTHYROXINE SODIUM 50 MCG PO TABS: 50.0000 ug | ORAL_TABLET | Freq: Every day | ORAL | Status: DC

## 2012-11-29 NOTE — Patient Instructions (Addendum)
Please complete the following lab tests before your next follow up appointment: BMET - 401.9 TSH - 244.9 A1c - 790.29 

## 2012-11-29 NOTE — Assessment & Plan Note (Addendum)
Symptoms initially improved with antibiotic ointment but got worse. 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 She is completing course of cortisone eyedrops with good response.

## 2012-11-29 NOTE — Assessment & Plan Note (Signed)
Patient continued to have intermittent headaches. Refer to Dr. Vela Prose for further evaluation and treatment.

## 2012-11-29 NOTE — Progress Notes (Signed)
Subjective:    [name], female    DOB: 1941/05/28, 72 y.o.   MRN: [id number]  HPI  72 year old white female with history of abnormal glucose, hypothyroidism and fibromyalgia for followup. Patient has multiple complaints. Patient reports having to see ophthalmologist for blepharitis of left eye. Her redness and swelling initially improved with use of antibiotic ointment but symptoms returned. Patient seen by Dr. Emily Filbert. She was diagnosed with possible allergic reaction. She is currently finishing cortisone eyedrops.  Her eye swelling has significantly improved.  Patient complains of intermittent right elbow pain. Symptoms worse with resting her elbow on a flat surface.  She also complains of weakness in her left knee. She has had total knee replacement. She cannot seem to brace on her left side when standing up.  She also complains of intermittent pain in her right knee. She has tenderness of medial joint line.  She's had previous right foot surgery. She's experienced lateral foot pain over last several weeks. Her podiatrist recommended additional surgery to remove pins for metatarsal joint. She has declined this for now.  Patient still having intermittent headaches. She has history of cluster headache.  She has been using Fioricet as needed.  Review of Systems Mild weight loss, negative for chest pain She would like to consider liposuction  Past Medical History  Diagnosis Date  . Chest pain, atypical     Negative stress test 02/2009  . Other and unspecified hyperlipidemia   . Personal history of urinary calculi   . Pain in joint, shoulder region   . Irritable bowel syndrome   . Chronic insomnia   . GERD (gastroesophageal reflux disease)   . History of colonic polyps   . Fibromyalgia   . Hypothyroidism   . Other abnormal glucose   . Stress incontinence, female   . Macular degeneration   . Cluster headaches     history of migraines  . Meniscus tear     history  of right knee torn meniscus  . Asthma     hx of -no inhalers  . Arthritis   . Kidney stone   . Cataract     History   Social History  . Marital Status: Married    Spouse Name: N/A    Number of Children: N/A  . Years of Education: N/A   Occupational History  . Not on file.   Social History Main Topics  . Smoking status: Never Smoker   . Smokeless tobacco: Never Used  . Alcohol Use: No  . Drug Use: No  . Sexually Active: Not on file   Other Topics Concern  . 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 Appendectomy   . Cholecystectomy   . Abdominal hysterectomy   . Tonsillectomy   . Eye surgery     to repair macular hole  . Cataract extraction, bilateral   . Knee arthroscopy     > 10 years ago  . Joint replacement 03/15/11    left knee replacement  . Colonoscopy   . Ganglion cyst excision     rt foot  . Foot arthroplasty     lt   . Mass excision 11/04/2011    Procedure: EXCISION MASS;  Surgeon: Wyn Forster., MD;  Location: [facility];  Service:  Orthopedics;  Laterality: Right;  excisional biopsy right ulna mass  . Hemorrhoid surgery     03/1993  . Breast cyst aspiration     9 cysts  . Toe surgery     preventative crossover toe surg/right foot  . Toe surgery     left foot/screw  in 2nd toe  . Upper gastrointestinal endoscopy   . Skin tags removed     breast, panty line, neckline    Family History  Problem Relation Age of Onset  . Stroke Mother     died age 67  . Diabetes Mother   . Breast cancer Mother   . Breast cancer Sister   . Breast cancer Paternal Aunt   . Diabetes Maternal Grandfather   . Breast cancer Paternal Grandmother   . Breast cancer Paternal Aunt     No Known Allergies  Current Outpatient Prescriptions on File Prior to Visit  Medication Sig Dispense Refill  .  Alcaftadine (LASTACAFT) 0.25 % SOLN Apply to eye.      Marland Kitchen amoxicillin (AMOXIL) 500 MG capsule Take 4 caps 30-60 minutes prior to dental procedure  4 capsule  0  . calcium carbonate (OS-CAL) 600 MG TABS Take 600 mg by mouth 2 (two) times daily with a meal.        . citalopram (CELEXA) 20 MG tablet Take 0.5 tablets (10 mg total) by mouth daily.  90 tablet  3  . Coenzyme Q10 150 MG CAPS Take 150 mg by mouth daily.        . diazepam (VALIUM) 5 MG tablet Take 1 tablet (5 mg total) by mouth every 6 (six) hours as needed.  30 tablet  3  . diclofenac sodium (VOLTAREN) 1 % GEL Apply 1 application topically as needed.      . docusate sodium (STOOL SOFTENER) 100 MG capsule Take 100 mg by mouth 3 (three) times daily as needed.        Marland Kitchen esomeprazole (NEXIUM) 40 MG capsule Take 1 capsule (40 mg total) by mouth daily.  90 capsule  3  . fluocinonide cream (LIDEX) 0.05 % Apply 1 application topically 2 (two) times daily.      Marland Kitchen HYDROcodone-acetaminophen (LORTAB) 7.5-500 MG per tablet 1/2 to one tablet every 8 hrs as needed  30 tablet  0  . levothyroxine (SYNTHROID, LEVOTHROID) 50 MCG tablet Take 1 tablet (50 mcg total) by mouth daily.  90 tablet  3  . meloxicam (MOBIC) 7.5 MG tablet One tablet by mouth once daily only as needed for pain.  90 tablet  3  . metFORMIN (GLUCOPHAGE) 1000 MG tablet Take 1 tablet (1,000 mg total) by mouth 2 (two) times daily with a meal.  180 tablet  3  . methocarbamol (ROBAXIN) 500 MG tablet Take 1 tablet (500 mg total) by mouth 3 (three) times daily as needed.  30 tablet  0  . Multiple Vitamin (MULTIVITAMIN PO) Take by mouth daily.        Marland Kitchen omega-3 acid ethyl esters (LOVAZA) 1 G capsule Take 1 g by mouth 2 (two) times daily.      . pregabalin (LYRICA) 75 MG capsule Take 1 capsule (75 mg total) by mouth 2 (two) times daily.  180 capsule  1  . Probiotic Product (ACIDOPHILUS PROBIOTIC BLEND) CAPS Take by mouth daily.        . simvastatin (ZOCOR) 10 MG tablet Take 1 tablet (10 mg total) by  mouth at bedtime.  90 tablet  3  .  temazepam (RESTORIL) 7.5 MG capsule Take 1 capsule (7.5 mg total) by mouth at bedtime as needed.  90 capsule  1  . albuterol (PROVENTIL HFA;VENTOLIN HFA) 108 (90 BASE) MCG/ACT inhaler Inhale 2 puffs into the lungs every 6 (six) hours as needed for wheezing.  3 Inhaler  3    BP 122/80  Pulse 72  Temp 98 F (36.7 C) (Oral)  Wt 183 lb (83.008 kg)       Objective:   Physical Exam  Constitutional: She is oriented to person, place, and time. She appears well-developed and well-nourished. No distress.  HENT:  Head: Normocephalic and atraumatic.  Eyes: EOM are normal. Pupils are equal, round, and reactive to light.  Cardiovascular: Normal rate, regular rhythm and normal heart sounds.   Pulmonary/Chest: Effort normal and breath sounds normal. She has no wheezes.  Musculoskeletal: She exhibits no edema.       Tenderness of right elbow (triceps tendon) Tenderness of medial aspect of right knee (near medial collateral ligament) right knee joint is stable  Neurological: She is oriented to person, place, and time. No cranial nerve deficit.  Psychiatric: She has a normal mood and affect. Her behavior is normal.          Assessment & Plan:

## 2012-11-29 NOTE — Assessment & Plan Note (Signed)
Increase levothyroxine to 50 mcg. Repeat TSH before next office visit. Lab Results  Component Value Date   TSH 5.33 11/22/2012

## 2012-11-29 NOTE — Assessment & Plan Note (Signed)
A1c stable at 5.9. Continue metformin.  Lab Results  Component Value Date   HGBA1C 5.9 11/22/2012

## 2012-11-29 NOTE — Assessment & Plan Note (Signed)
Patient complains of right elbow pain. She may have tendinitis versus elbow bursitis. Use of Voltaren gel as directed.

## 2012-11-29 NOTE — Assessment & Plan Note (Signed)
Patient experiencing some weakness in her left leg since her total knee replacement. Refer to physical therapy for strengthening exercises.

## 2012-12-04 ENCOUNTER — Encounter: Payer: Self-pay | Admitting: Internal Medicine

## 2012-12-04 DIAGNOSIS — E669 Obesity, unspecified: Secondary | ICD-10-CM | POA: Diagnosis not present

## 2012-12-04 DIAGNOSIS — R7309 Other abnormal glucose: Secondary | ICD-10-CM | POA: Diagnosis not present

## 2012-12-04 DIAGNOSIS — Z803 Family history of malignant neoplasm of breast: Secondary | ICD-10-CM | POA: Diagnosis not present

## 2012-12-04 DIAGNOSIS — L680 Hirsutism: Secondary | ICD-10-CM | POA: Diagnosis not present

## 2012-12-07 ENCOUNTER — Ambulatory Visit (INDEPENDENT_AMBULATORY_CARE_PROVIDER_SITE_OTHER): Payer: Medicare Other | Admitting: Internal Medicine

## 2012-12-07 ENCOUNTER — Encounter: Payer: Self-pay | Admitting: Internal Medicine

## 2012-12-07 VITALS — BP 144/82 | Temp 98.3°F | Wt 183.0 lb

## 2012-12-07 DIAGNOSIS — H43393 Other vitreous opacities, bilateral: Secondary | ICD-10-CM

## 2012-12-07 DIAGNOSIS — H43399 Other vitreous opacities, unspecified eye: Secondary | ICD-10-CM

## 2012-12-07 DIAGNOSIS — L570 Actinic keratosis: Secondary | ICD-10-CM | POA: Diagnosis not present

## 2012-12-07 DIAGNOSIS — H9202 Otalgia, left ear: Secondary | ICD-10-CM | POA: Insufficient documentation

## 2012-12-07 DIAGNOSIS — H9209 Otalgia, unspecified ear: Secondary | ICD-10-CM

## 2012-12-07 DIAGNOSIS — M25569 Pain in unspecified knee: Secondary | ICD-10-CM | POA: Diagnosis not present

## 2012-12-07 MED ORDER — ALBUTEROL SULFATE HFA 108 (90 BASE) MCG/ACT IN AERS: 2.0000 | INHALATION_SPRAY | Freq: Four times a day (QID) | RESPIRATORY_TRACT | Status: DC | PRN

## 2012-12-07 NOTE — Assessment & Plan Note (Signed)
Patient complains of possible small scotoma is in her vision. Patient advised urgently follow up with her ophthalmologist. Limited funduscopic exam performed. No obvious abnormalities noted.

## 2012-12-07 NOTE — Progress Notes (Signed)
Subjective:    [name], female    DOB: 07/06/1941, 72 y.o.   MRN: [id number]  HPI  72 year old white female complains of severe intermittent left ear pain starting Monday. She describes sharp sensation. Patient reports no hearing loss or tenderness. Her ear ache resolved on its own. She denies any jaw discomfort. She has had recent dental work.  Patient also complains of scaly area on right upper calf. It has been there for approximately one to 2 months. No significant change in size.  Patient also complains of visual changes. She's had problems with floaters in the past. She has noticed small dark spots in her vision.  Review of Systems Negative for headache, denies shoulder or hip pain  Past Medical History  Diagnosis Date  . Chest pain, atypical     Negative stress test 02/2009  . Other and unspecified hyperlipidemia   . Personal history of urinary calculi   . Pain in joint, shoulder region   . Irritable bowel syndrome   . Chronic insomnia   . GERD (gastroesophageal reflux disease)   . History of colonic polyps   . Fibromyalgia   . Hypothyroidism   . Other abnormal glucose   . Stress incontinence, female   . Macular degeneration   . Cluster headaches     history of migraines  . Meniscus tear     history of right knee torn meniscus  . Asthma     hx of -no inhalers  . Arthritis   . Kidney stone   . Cataract     History   Social History  . Marital Status: Married    Spouse Name: N/A    Number of Children: N/A  . Years of Education: N/A   Occupational History  . Not on file.   Social History Main Topics  . Smoking status: Never Smoker   . Smokeless tobacco: Never Used  . Alcohol Use: No  . Drug Use: No  . Sexually Active: Not on file   Other Topics Concern  . 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 Appendectomy   . Cholecystectomy   . Abdominal hysterectomy   . Tonsillectomy   . Eye surgery     to repair macular hole  . Cataract extraction, bilateral   . Knee arthroscopy     > 10 years ago  . Joint replacement 03/15/11    left knee replacement  . Colonoscopy   . Ganglion cyst excision     rt foot  . Foot arthroplasty     lt   . Mass excision 11/04/2011    Procedure: EXCISION MASS;  Surgeon: Wyn Forster., MD;  Location: [facility];  Service: Orthopedics;  Laterality: Right;  excisional biopsy right ulna mass  . Hemorrhoid surgery     03/1993  . Breast cyst aspiration     9 cysts  . Toe surgery     preventative crossover toe surg/right foot  . Toe surgery     left foot/screw  in 2nd toe  . Upper gastrointestinal endoscopy   . Skin tags removed     breast, panty line, neckline    Family History  Problem Relation Age of Onset  . Stroke Mother     died age 25  .  Diabetes Mother   . Breast cancer Mother   . Breast cancer Sister   . Breast cancer Paternal Aunt   . Diabetes Maternal Grandfather   . Breast cancer Paternal Grandmother   . Breast cancer Paternal Aunt     No Known Allergies  Current Outpatient Prescriptions on File Prior to Visit  Medication Sig Dispense Refill  . albuterol (PROVENTIL HFA;VENTOLIN HFA) 108 (90 BASE) MCG/ACT inhaler Inhale 2 puffs into the lungs every 6 (six) hours as needed for wheezing.  3 Inhaler  3  . Alcaftadine (LASTACAFT) 0.25 % SOLN Apply to eye.      Marland Kitchen amoxicillin (AMOXIL) 500 MG capsule Take 4 caps 30-60 minutes prior to dental procedure  4 capsule  0  . calcium carbonate (OS-CAL) 600 MG TABS Take 600 mg by mouth 2 (two) times daily with a meal.        . citalopram (CELEXA) 20 MG tablet Take 0.5 tablets (10 mg total) by mouth daily.  90 tablet  3  . Coenzyme Q10 150 MG CAPS Take 150 mg by mouth daily.        . diazepam (VALIUM) 5 MG tablet Take 1 tablet (5 mg total) by mouth every 6 (six) hours as  needed.  30 tablet  3  . diclofenac sodium (VOLTAREN) 1 % GEL Apply 1 application topically as needed.      . docusate sodium (STOOL SOFTENER) 100 MG capsule Take 100 mg by mouth 3 (three) times daily as needed.        Marland Kitchen esomeprazole (NEXIUM) 40 MG capsule Take 1 capsule (40 mg total) by mouth daily.  90 capsule  3  . fluocinonide cream (LIDEX) 0.05 % Apply 1 application topically 2 (two) times daily.      Marland Kitchen HYDROcodone-acetaminophen (LORTAB) 7.5-500 MG per tablet 1/2 to one tablet every 8 hrs as needed  30 tablet  0  . levothyroxine (SYNTHROID, LEVOTHROID) 50 MCG tablet Take 1 tablet (50 mcg total) by mouth daily.  90 tablet  3  . meloxicam (MOBIC) 7.5 MG tablet One tablet by mouth once daily only as needed for pain.  90 tablet  3  . metFORMIN (GLUCOPHAGE) 1000 MG tablet Take 1 tablet (1,000 mg total) by mouth 2 (two) times daily with a meal.  180 tablet  3  . methocarbamol (ROBAXIN) 500 MG tablet Take 1 tablet (500 mg total) by mouth 3 (three) times daily as needed.  30 tablet  0  . Multiple Vitamin (MULTIVITAMIN PO) Take by mouth daily.        Marland Kitchen omega-3 acid ethyl esters (LOVAZA) 1 G capsule Take 1 g by mouth 2 (two) times daily.      . pregabalin (LYRICA) 75 MG capsule Take 1 capsule (75 mg total) by mouth 2 (two) times daily.  180 capsule  1  . Probiotic Product (ACIDOPHILUS PROBIOTIC BLEND) CAPS Take by mouth daily.        . simvastatin (ZOCOR) 10 MG tablet Take 1 tablet (10 mg total) by mouth at bedtime.  90 tablet  3  . solifenacin (VESICARE) 5 MG tablet Take 1 tablet (5 mg total) by mouth daily.  90 tablet  1  . temazepam (RESTORIL) 7.5 MG capsule Take 1 capsule (7.5 mg total) by mouth at bedtime as needed.  90 capsule  1    BP 144/82  Temp 98.3 F (36.8 C) (Oral)  Wt 183 lb (83.008 kg)       Objective:   Physical  Exam  Constitutional: She appears well-developed and well-nourished.  HENT:  Head: Normocephalic and atraumatic.  Right Ear: External ear normal.  Left Ear:  External ear normal.  Eyes: EOM are normal. Pupils are equal, round, and reactive to light.       Limited funduscopic exam, no obvious hemorrhages  Cardiovascular: Normal rate, regular rhythm and normal heart sounds.   Pulmonary/Chest: Effort normal and breath sounds normal. She has no wheezes.  Skin: Skin is warm and dry.       6-8 mm scaly area right upper calf          Assessment & Plan:

## 2012-12-07 NOTE — Assessment & Plan Note (Signed)
Patient has 6-8 cm scaly area right upper calf. I suspect actinic keratosis.  Area treated with liquid nitrogen. After care discussed. Reassess in 2 months.

## 2012-12-07 NOTE — Patient Instructions (Addendum)
Please follow up with Dr. Emily Filbert re: floaters / scotomas ASAP Use mobic 7.5 mg once daily as needed for left ear pain.  If symptoms recur, consider seeing your dentist about TMJ

## 2012-12-07 NOTE — Assessment & Plan Note (Signed)
Patient experiencing sharp intermittent left ear pain. Ear exam is normal. Hearing is grossly normal. I suspect her pain may be secondary to TMJ. Use anti-inflammatories for now. If persistent symptoms,patient advised to followup with her dentist. She may benefit from mouth guard.

## 2012-12-08 DIAGNOSIS — H43819 Vitreous degeneration, unspecified eye: Secondary | ICD-10-CM | POA: Diagnosis not present

## 2012-12-15 ENCOUNTER — Encounter: Payer: Self-pay | Admitting: Internal Medicine

## 2012-12-15 DIAGNOSIS — M25569 Pain in unspecified knee: Secondary | ICD-10-CM | POA: Diagnosis not present

## 2012-12-20 DIAGNOSIS — M25569 Pain in unspecified knee: Secondary | ICD-10-CM | POA: Diagnosis not present

## 2012-12-21 DIAGNOSIS — H43819 Vitreous degeneration, unspecified eye: Secondary | ICD-10-CM | POA: Diagnosis not present

## 2012-12-22 DIAGNOSIS — M25569 Pain in unspecified knee: Secondary | ICD-10-CM | POA: Diagnosis not present

## 2012-12-27 DIAGNOSIS — M25569 Pain in unspecified knee: Secondary | ICD-10-CM | POA: Diagnosis not present

## 2013-01-04 ENCOUNTER — Encounter: Payer: Self-pay | Admitting: *Deleted

## 2013-01-26 DIAGNOSIS — M25569 Pain in unspecified knee: Secondary | ICD-10-CM | POA: Diagnosis not present

## 2013-01-30 ENCOUNTER — Encounter: Payer: Self-pay | Admitting: Internal Medicine

## 2013-01-31 MED ORDER — CITALOPRAM HYDROBROMIDE 20 MG PO TABS: 10.0000 mg | ORAL_TABLET | Freq: Every day | ORAL | Status: DC

## 2013-01-31 MED ORDER — ESOMEPRAZOLE MAGNESIUM 40 MG PO CPDR: 40.0000 mg | DELAYED_RELEASE_CAPSULE | Freq: Every day | ORAL | Status: DC

## 2013-01-31 MED ORDER — TEMAZEPAM 7.5 MG PO CAPS: 7.5000 mg | ORAL_CAPSULE | Freq: Every evening | ORAL | Status: DC | PRN

## 2013-01-31 MED ORDER — SIMVASTATIN 10 MG PO TABS: 10.0000 mg | ORAL_TABLET | Freq: Every day | ORAL | Status: DC

## 2013-01-31 MED ORDER — OMEGA-3-ACID ETHYL ESTERS 1 G PO CAPS: 1.0000 g | ORAL_CAPSULE | Freq: Two times a day (BID) | ORAL | Status: DC

## 2013-02-01 DIAGNOSIS — M79609 Pain in unspecified limb: Secondary | ICD-10-CM | POA: Diagnosis not present

## 2013-02-01 DIAGNOSIS — B351 Tinea unguium: Secondary | ICD-10-CM | POA: Diagnosis not present

## 2013-02-13 ENCOUNTER — Encounter: Payer: Self-pay | Admitting: Internal Medicine

## 2013-02-13 ENCOUNTER — Other Ambulatory Visit (INDEPENDENT_AMBULATORY_CARE_PROVIDER_SITE_OTHER): Payer: Medicare Other

## 2013-02-13 DIAGNOSIS — E039 Hypothyroidism, unspecified: Secondary | ICD-10-CM

## 2013-02-13 DIAGNOSIS — R7309 Other abnormal glucose: Secondary | ICD-10-CM

## 2013-02-13 LAB — TSH: TSH: 2.16 u[IU]/mL (ref 0.35–5.50)

## 2013-02-13 LAB — BASIC METABOLIC PANEL
GFR: 78.8 mL/min (ref >60.00)
Glucose, Bld: 101 mg/dL — ABNORMAL HIGH (ref 70–99)
Potassium: 5.3 mEq/L — ABNORMAL HIGH (ref 3.5–5.1)
Sodium: 140 mEq/L (ref 135–145)

## 2013-02-14 ENCOUNTER — Encounter: Payer: Self-pay | Admitting: Internal Medicine

## 2013-02-20 ENCOUNTER — Ambulatory Visit: Payer: Medicare Other | Admitting: Internal Medicine

## 2013-02-21 ENCOUNTER — Ambulatory Visit (INDEPENDENT_AMBULATORY_CARE_PROVIDER_SITE_OTHER): Payer: Medicare Other | Admitting: Internal Medicine

## 2013-02-21 ENCOUNTER — Encounter: Payer: Self-pay | Admitting: Internal Medicine

## 2013-02-21 VITALS — BP 134/80 | HR 68 | Temp 97.9°F | Wt 183.0 lb

## 2013-02-21 DIAGNOSIS — R7309 Other abnormal glucose: Secondary | ICD-10-CM

## 2013-02-21 NOTE — Patient Instructions (Addendum)
Please complete the following lab tests before your next follow up appointment: BMET, A1c - 790.29 

## 2013-02-21 NOTE — Assessment & Plan Note (Signed)
Patient's prediabetes/polycystic ovarian syndrome stable. Continue same dose of metformin. Lab Results  Component Value Date   HGBA1C 5.8 02/13/2013

## 2013-02-21 NOTE — Progress Notes (Signed)
Subjective:    [name], female    DOB: 1941/06/28, 72 y.o.   MRN: [id number]  HPI  72 year old white female with history of prediabetes, hypothyroidism and hyperlipidemia for routine followup.  Patient reports previous issues with jaw pain have resolved.  Patient had liquid nitrogen applied to scaly area on right upper calf. It has healed well. No recurrence.  Prediabetes-patient following Low-carb diet and exercising regularly. Her A1c improved.  Patient having chronic foot pain. She is having pins removed this Friday.  Patient's recent blood work showed mild hyperkalemia. She takes Mobic for right foot pain. Patient advised to follow low potassium diet.   Review of Systems Negative for chest pain  Past Medical History  Diagnosis Date  . Chest pain, atypical     Negative stress test 02/2009  . Other and unspecified hyperlipidemia   . Personal history of urinary calculi   . Pain in joint, shoulder region   . Irritable bowel syndrome   . Chronic insomnia   . GERD (gastroesophageal reflux disease)   . History of colonic polyps   . Fibromyalgia   . Hypothyroidism   . Other abnormal glucose   . Stress incontinence, female   . Macular degeneration   . Cluster headaches     history of migraines  . Meniscus tear     history of right knee torn meniscus  . Asthma     hx of -no inhalers  . Arthritis   . Kidney stone   . Cataract     History   Social History  . Marital Status: Married    Spouse Name: N/A    Number of Children: N/A  . Years of Education: N/A   Occupational History  . Not on file.   Social History Main Topics  . Smoking status: Never Smoker   . Smokeless tobacco: Never Used  . Alcohol Use: No  . Drug Use: No  . Sexually Active: Not on file   Other Topics Concern  . 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 Appendectomy    . Cholecystectomy    . Abdominal hysterectomy    . Tonsillectomy    . Eye surgery      to repair macular hole  . Cataract extraction, bilateral    . Knee arthroscopy      > 10 years ago  . Joint replacement  03/15/11    left knee replacement  . Colonoscopy    . Ganglion cyst excision      rt foot  . Foot arthroplasty      lt   . Mass excision  11/04/2011    Procedure: EXCISION MASS;  Surgeon: Wyn Forster., MD;  Location: [facility];  Service: Orthopedics;  Laterality: Right;  excisional biopsy right ulna mass  . Hemorrhoid surgery      03/1993  . Breast cyst aspiration      9 cysts  . Toe surgery      preventative crossover toe surg/right foot  . Toe surgery      left foot/screw  in 2nd toe  . Upper gastrointestinal endoscopy    . Skin tags removed      breast, panty line, neckline  Family History  Problem Relation Age of Onset  . Stroke Mother     died age 47  . Diabetes Mother   . Breast cancer Mother   . Breast cancer Sister   . Breast cancer Paternal Aunt   . Diabetes Maternal Grandfather   . Breast cancer Paternal Grandmother   . Breast cancer Paternal Aunt     No Known Allergies  Current Outpatient Prescriptions on File Prior to Visit  Medication Sig Dispense Refill  . albuterol (PROVENTIL HFA;VENTOLIN HFA) 108 (90 BASE) MCG/ACT inhaler Inhale 2 puffs into the lungs every 6 (six) hours as needed for wheezing.  3 Inhaler  3  . calcium carbonate (OS-CAL) 600 MG TABS Take 600 mg by mouth 2 (two) times daily with a meal.        . citalopram (CELEXA) 20 MG tablet Take 0.5 tablets (10 mg total) by mouth daily.  90 tablet  1  . Coenzyme Q10 150 MG CAPS Take 150 mg by mouth daily.        . diazepam (VALIUM) 5 MG tablet Take 1 tablet (5 mg total) by mouth every 6 (six) hours as needed.  30 tablet  3  . diclofenac sodium (VOLTAREN) 1 % GEL Apply 1 application topically as needed.       . docusate sodium (STOOL SOFTENER) 100 MG capsule Take 100 mg by mouth 3 (three) times daily as needed.        Marland Kitchen esomeprazole (NEXIUM) 40 MG capsule Take 1 capsule (40 mg total) by mouth daily.  90 capsule  1  . fluocinonide cream (LIDEX) 0.05 % Apply 1 application topically 2 (two) times daily.      Marland Kitchen HYDROcodone-acetaminophen (LORTAB) 7.5-500 MG per tablet 1/2 to one tablet every 8 hrs as needed  30 tablet  0  . levothyroxine (SYNTHROID, LEVOTHROID) 50 MCG tablet Take 1 tablet (50 mcg total) by mouth daily.  90 tablet  3  . meloxicam (MOBIC) 7.5 MG tablet One tablet by mouth once daily only as needed for pain.  90 tablet  3  . metFORMIN (GLUCOPHAGE) 1000 MG tablet Take 1 tablet (1,000 mg total) by mouth 2 (two) times daily with a meal.  180 tablet  3  . methocarbamol (ROBAXIN) 500 MG tablet Take 1 tablet (500 mg total) by mouth 3 (three) times daily as needed.  30 tablet  0  . Multiple Vitamin (MULTIVITAMIN PO) Take by mouth daily.        Marland Kitchen omega-3 acid ethyl esters (LOVAZA) 1 G capsule Take 1 capsule (1 g total) by mouth 2 (two) times daily.  180 capsule  1  . pregabalin (LYRICA) 75 MG capsule Take 1 capsule (75 mg total) by mouth 2 (two) times daily.  180 capsule  1  . Probiotic Product (ACIDOPHILUS PROBIOTIC BLEND) CAPS Take by mouth daily.        . simvastatin (ZOCOR) 10 MG tablet Take 1 tablet (10 mg total) by mouth at bedtime.  90 tablet  1  . solifenacin (VESICARE) 5 MG tablet Take 1 tablet (5 mg total) by mouth daily.  90 tablet  1  . temazepam (RESTORIL) 7.5 MG capsule Take 1 capsule (7.5 mg total) by mouth at bedtime as needed.  90 capsule  1   No current facility-administered medications on file prior to visit.    BP 134/80  Pulse 68  Temp(Src) 97.9 F (36.6 C) (Oral)  Wt 183 lb (83.008 kg)  BMI 35.74 kg/m2  Objective:   Physical Exam  Constitutional: She is oriented to person, place, and time. She appears well-developed and well-nourished.  Cardiovascular:  Normal rate, regular rhythm and normal heart sounds.   Pulmonary/Chest: Effort normal and breath sounds normal. She has no wheezes.  Neurological: She is alert and oriented to person, place, and time. No cranial nerve deficit.  Psychiatric: She has a normal mood and affect. Her behavior is normal.          Assessment & Plan:

## 2013-02-23 DIAGNOSIS — E7800 Pure hypercholesterolemia, unspecified: Secondary | ICD-10-CM | POA: Diagnosis not present

## 2013-02-23 DIAGNOSIS — G5760 Lesion of plantar nerve, unspecified lower limb: Secondary | ICD-10-CM | POA: Diagnosis not present

## 2013-02-23 DIAGNOSIS — D492 Neoplasm of unspecified behavior of bone, soft tissue, and skin: Secondary | ICD-10-CM | POA: Diagnosis not present

## 2013-02-23 DIAGNOSIS — T8489XA Other specified complication of internal orthopedic prosthetic devices, implants and grafts, initial encounter: Secondary | ICD-10-CM | POA: Diagnosis not present

## 2013-02-23 DIAGNOSIS — Z472 Encounter for removal of internal fixation device: Secondary | ICD-10-CM | POA: Diagnosis not present

## 2013-02-23 DIAGNOSIS — D1739 Benign lipomatous neoplasm of skin and subcutaneous tissue of other sites: Secondary | ICD-10-CM | POA: Diagnosis not present

## 2013-02-23 DIAGNOSIS — D1779 Benign lipomatous neoplasm of other sites: Secondary | ICD-10-CM | POA: Diagnosis not present

## 2013-03-01 DIAGNOSIS — Z472 Encounter for removal of internal fixation device: Secondary | ICD-10-CM | POA: Diagnosis not present

## 2013-03-15 ENCOUNTER — Other Ambulatory Visit (INDEPENDENT_AMBULATORY_CARE_PROVIDER_SITE_OTHER): Payer: Medicare Other

## 2013-03-15 ENCOUNTER — Telehealth: Payer: Self-pay | Admitting: Internal Medicine

## 2013-03-15 DIAGNOSIS — R7309 Other abnormal glucose: Secondary | ICD-10-CM | POA: Diagnosis not present

## 2013-03-15 LAB — BASIC METABOLIC PANEL
GFR: 94.12 mL/min (ref >60.00)
Glucose, Bld: 93 mg/dL (ref 70–99)
Potassium: 4.8 mEq/L (ref 3.5–5.1)
Sodium: 135 mEq/L (ref 135–145)

## 2013-03-15 LAB — HEMOGLOBIN A1C: Hgb A1c MFr Bld: 6 % (ref 4.6–6.5)

## 2013-03-15 NOTE — Telephone Encounter (Signed)
Pt stopped by today to schedule labs before next visit. She has labs completed this morning, but believes she will need more prior to her 06/813 appt. Please assist.

## 2013-03-15 NOTE — Telephone Encounter (Signed)
Per Dr Olegario Messier last office note draw a BMET and HgbA1c.  Notified pt via mychart.

## 2013-04-26 DIAGNOSIS — M79609 Pain in unspecified limb: Secondary | ICD-10-CM | POA: Diagnosis not present

## 2013-04-26 DIAGNOSIS — B351 Tinea unguium: Secondary | ICD-10-CM | POA: Diagnosis not present

## 2013-05-15 ENCOUNTER — Encounter: Payer: Self-pay | Admitting: Internal Medicine

## 2013-05-16 MED ORDER — BUTALBITAL-APAP-CAFF-COD 50-325-40-30 MG PO CAPS: 1.0000 | ORAL_CAPSULE | ORAL | Status: DC | PRN

## 2013-05-23 DIAGNOSIS — G44229 Chronic tension-type headache, not intractable: Secondary | ICD-10-CM | POA: Diagnosis not present

## 2013-05-23 DIAGNOSIS — G43019 Migraine without aura, intractable, without status migrainosus: Secondary | ICD-10-CM | POA: Diagnosis not present

## 2013-05-23 DIAGNOSIS — IMO0001 Reserved for inherently not codable concepts without codable children: Secondary | ICD-10-CM | POA: Diagnosis not present

## 2013-05-25 DIAGNOSIS — R51 Headache: Secondary | ICD-10-CM | POA: Diagnosis not present

## 2013-06-01 ENCOUNTER — Other Ambulatory Visit: Payer: Self-pay | Admitting: Internal Medicine

## 2013-06-01 DIAGNOSIS — Z1231 Encounter for screening mammogram for malignant neoplasm of breast: Secondary | ICD-10-CM

## 2013-06-06 DIAGNOSIS — E669 Obesity, unspecified: Secondary | ICD-10-CM | POA: Diagnosis not present

## 2013-06-06 DIAGNOSIS — R7309 Other abnormal glucose: Secondary | ICD-10-CM | POA: Diagnosis not present

## 2013-06-06 DIAGNOSIS — L680 Hirsutism: Secondary | ICD-10-CM | POA: Diagnosis not present

## 2013-06-06 DIAGNOSIS — Z803 Family history of malignant neoplasm of breast: Secondary | ICD-10-CM | POA: Diagnosis not present

## 2013-06-08 ENCOUNTER — Encounter: Payer: Self-pay | Admitting: Internal Medicine

## 2013-06-22 ENCOUNTER — Ambulatory Visit (INDEPENDENT_AMBULATORY_CARE_PROVIDER_SITE_OTHER): Payer: Medicare Other | Admitting: Internal Medicine

## 2013-06-22 ENCOUNTER — Encounter: Payer: Self-pay | Admitting: Internal Medicine

## 2013-06-22 VITALS — BP 116/74 | HR 72 | Temp 98.0°F | Wt 183.0 lb

## 2013-06-22 DIAGNOSIS — M542 Cervicalgia: Secondary | ICD-10-CM | POA: Insufficient documentation

## 2013-06-22 DIAGNOSIS — E785 Hyperlipidemia, unspecified: Secondary | ICD-10-CM

## 2013-06-22 DIAGNOSIS — R7309 Other abnormal glucose: Secondary | ICD-10-CM

## 2013-06-22 DIAGNOSIS — E039 Hypothyroidism, unspecified: Secondary | ICD-10-CM

## 2013-06-22 DIAGNOSIS — N3281 Overactive bladder: Secondary | ICD-10-CM

## 2013-06-22 DIAGNOSIS — N318 Other neuromuscular dysfunction of bladder: Secondary | ICD-10-CM

## 2013-06-22 DIAGNOSIS — G44009 Cluster headache syndrome, unspecified, not intractable: Secondary | ICD-10-CM

## 2013-06-22 LAB — BASIC METABOLIC PANEL
BUN: 17 mg/dL (ref 6–23)
CO2: 26 mEq/L (ref 19–32)
Chloride: 103 mEq/L (ref 96–112)
Potassium: 4.2 mEq/L (ref 3.5–5.3)

## 2013-06-22 LAB — HEPATIC FUNCTION PANEL
AST: 13 U/L (ref 0–37)
Alkaline Phosphatase: 58 U/L (ref 39–117)
Indirect Bilirubin: 0.4 mg/dL (ref 0.0–0.9)
Total Bilirubin: 0.5 mg/dL (ref 0.3–1.2)

## 2013-06-22 LAB — LIPID PANEL
HDL: 41 mg/dL (ref >39)
LDL Cholesterol: 84 mg/dL (ref 0–99)
Total CHOL/HDL Ratio: 4.2 Ratio

## 2013-06-22 LAB — HEMOGLOBIN A1C: Mean Plasma Glucose: 117 mg/dL — ABNORMAL HIGH (ref <117)

## 2013-06-22 NOTE — Patient Instructions (Signed)
Perform Kegel exercises as directed for 1-2 months Try taking higher dose of Vesicare as directed.

## 2013-06-22 NOTE — Assessment & Plan Note (Signed)
Patient seen by Dr. Vela Prose.  Headaches improved with higher dose of Lyrica.

## 2013-06-22 NOTE — Assessment & Plan Note (Signed)
Her weight is stable. Continue same dose of metformin. Metformin is helping hirsutism/PCOS.

## 2013-06-22 NOTE — Progress Notes (Signed)
Subjective:    [name], female    DOB: 1941-04-15, 72 y.o.   MRN: [id number]  HPI  72 year old white female with history of prediabetes, hypothyroidism and polycystic ovarian syndrome for followup. Patient reports she has a slight lump in the right upper neck that she noticed one to 2 weeks ago. She recently had upper dental work. Fortunately lump has gotten smaller over the last several days. Area was initially tender. She denies fever.  She complains of urinary symptoms. She has stress incontinence and also urinary urgency. She is currently taking VESIcare 5 mg once daily. She denies any side effects of dry mouth constipation. She had previous hysterectomy in 1978.  Review of Systems Negative for weight changes  Past Medical History  Diagnosis Date  . Chest pain, atypical     Negative stress test 02/2009  . Other and unspecified hyperlipidemia   . Personal history of urinary calculi   . Pain in joint, shoulder region   . Irritable bowel syndrome   . Chronic insomnia   . GERD (gastroesophageal reflux disease)   . History of colonic polyps   . Fibromyalgia   . Hypothyroidism   . Other abnormal glucose   . Stress incontinence, female   . Macular degeneration   . Cluster headaches     history of migraines  . Meniscus tear     history of right knee torn meniscus  . Asthma     hx of -no inhalers  . Arthritis   . Kidney stone   . Cataract     History   Social History  . Marital Status: Married    Spouse Name: N/A    Number of Children: N/A  . Years of Education: N/A   Occupational History  . Not on file.   Social History Main Topics  . Smoking status: Never Smoker   . Smokeless tobacco: Never Used  . Alcohol Use: No  . Drug Use: No  . Sexually Active: Not on file   Other Topics Concern  . Not on file   Social History Narrative   Occupation:  Retired Catering manager    Married with 3 grown children      Never Smoked     Alcohol use-no          Past Surgical History  Procedure Laterality Date  . Appendectomy    . Cholecystectomy    . Abdominal hysterectomy    . Tonsillectomy    . Eye surgery      to repair macular hole  . Cataract extraction, bilateral    . Knee arthroscopy      > 10 years ago  . Joint replacement  03/15/11    left knee replacement  . Colonoscopy    . Ganglion cyst excision      rt foot  . Foot arthroplasty      lt   . Mass excision  11/04/2011    Procedure: EXCISION MASS;  Surgeon: Wyn Forster., MD;  Location: [facility];  Service: Orthopedics;  Laterality: Right;  excisional biopsy right ulna mass  . Hemorrhoid surgery      03/1993  . Breast cyst aspiration      9 cysts  . Toe surgery      preventative crossover toe surg/right foot  . Toe surgery      left foot/screw  in 2nd toe  . Upper gastrointestinal endoscopy    . Skin tags removed  breast, panty line, neckline    Family History  Problem Relation Age of Onset  . Stroke Mother     died age 2  . Diabetes Mother   . Breast cancer Mother   . Breast cancer Sister   . Breast cancer Paternal Aunt   . Diabetes Maternal Grandfather   . Breast cancer Paternal Grandmother   . Breast cancer Paternal Aunt     No Known Allergies  Current Outpatient Prescriptions on File Prior to Visit  Medication Sig Dispense Refill  . albuterol (PROVENTIL HFA;VENTOLIN HFA) 108 (90 BASE) MCG/ACT inhaler Inhale 2 puffs into the lungs every 6 (six) hours as needed for wheezing.  3 Inhaler  3  . butalbital-acetaminophen-caffeine (FIORICET WITH CODEINE) 50-325-40-30 MG per capsule Take 1 capsule by mouth every 4 (four) hours as needed.  30 capsule  1  . calcium carbonate (OS-CAL) 600 MG TABS Take 600 mg by mouth 2 (two) times daily with a meal.        . citalopram (CELEXA) 20 MG tablet Take 0.5 tablets (10 mg total) by mouth daily.  90 tablet  1  . Coenzyme Q10 150 MG CAPS Take 150 mg by mouth daily.        . diazepam (VALIUM) 5 MG  tablet Take 1 tablet (5 mg total) by mouth every 6 (six) hours as needed.  30 tablet  3  . diclofenac sodium (VOLTAREN) 1 % GEL Apply 1 application topically as needed.      . docusate sodium (STOOL SOFTENER) 100 MG capsule Take 100 mg by mouth 3 (three) times daily as needed.        Marland Kitchen esomeprazole (NEXIUM) 40 MG capsule Take 1 capsule (40 mg total) by mouth daily.  90 capsule  1  . fluocinonide cream (LIDEX) 0.05 % Apply 1 application topically 2 (two) times daily.      Marland Kitchen HYDROcodone-acetaminophen (LORTAB) 7.5-500 MG per tablet 1/2 to one tablet every 8 hrs as needed  30 tablet  0  . levothyroxine (SYNTHROID, LEVOTHROID) 50 MCG tablet Take 1 tablet (50 mcg total) by mouth daily.  90 tablet  3  . metFORMIN (GLUCOPHAGE) 1000 MG tablet Take 1 tablet (1,000 mg total) by mouth 2 (two) times daily with a meal.  180 tablet  3  . methocarbamol (ROBAXIN) 500 MG tablet Take 1 tablet (500 mg total) by mouth 3 (three) times daily as needed.  30 tablet  0  . omega-3 acid ethyl esters (LOVAZA) 1 G capsule Take 1 capsule (1 g total) by mouth 2 (two) times daily.  180 capsule  1  . pregabalin (LYRICA) 75 MG capsule Take 1 capsule (75 mg total) by mouth 2 (two) times daily.  180 capsule  1  . Probiotic Product (ACIDOPHILUS PROBIOTIC BLEND) CAPS Take by mouth daily.        . simvastatin (ZOCOR) 10 MG tablet Take 1 tablet (10 mg total) by mouth at bedtime.  90 tablet  1  . solifenacin (VESICARE) 5 MG tablet Take 1 tablet (5 mg total) by mouth daily.  90 tablet  1  . temazepam (RESTORIL) 7.5 MG capsule Take 1 capsule (7.5 mg total) by mouth at bedtime as needed.  90 capsule  1   No current facility-administered medications on file prior to visit.    BP 116/74  Pulse 72  Temp(Src) 98 F (36.7 C) (Oral)  Wt 183 lb (83.008 kg)  BMI 35.74 kg/m2       Objective:  Physical Exam  Constitutional: She is oriented to person, place, and time. She appears well-developed and well-nourished.  HENT:  Head:  Normocephalic and atraumatic.  Right Ear: External ear normal.  Left Ear: External ear normal.  Mouth/Throat: Oropharynx is clear and moist.  Neck: Neck supple.  Mild right upper neck tenderness  Cardiovascular: Normal rate, regular rhythm and normal heart sounds.   No murmur heard. Pulmonary/Chest: Effort normal and breath sounds normal. She has no wheezes.  Lymphadenopathy:    She has no cervical adenopathy.  Neurological: She is alert and oriented to person, place, and time. No cranial nerve deficit.          Assessment & Plan:

## 2013-06-22 NOTE — Assessment & Plan Note (Signed)
Patient has noticed mild right upper neck pain and possible reactive lymph node. She had dental work one week ago. I suspect a reactive lymph node. It is getting smaller. Continue to monitor for now. Patient to report any significant change.

## 2013-06-22 NOTE — Assessment & Plan Note (Signed)
Patient advised to try kegel exercises for stress incontinence. Trial of higher dose of VESIcare 10 mg

## 2013-06-22 NOTE — Assessment & Plan Note (Signed)
Monitor fasting lipid panel and LFTs 

## 2013-06-23 LAB — TSH: TSH: 2.019 u[IU]/mL (ref 0.350–4.500)

## 2013-06-30 ENCOUNTER — Encounter: Payer: Self-pay | Admitting: Internal Medicine

## 2013-06-30 DIAGNOSIS — M79671 Pain in right foot: Secondary | ICD-10-CM

## 2013-07-02 ENCOUNTER — Ambulatory Visit (HOSPITAL_BASED_OUTPATIENT_CLINIC_OR_DEPARTMENT_OTHER)
Admission: RE | Admit: 2013-07-02 | Discharge: 2013-07-02 | Disposition: A | Discharge status: Home / Self Care | Payer: Medicare Other | Source: Ambulatory Visit | Attending: Internal Medicine | Admitting: Internal Medicine

## 2013-07-02 DIAGNOSIS — Z1231 Encounter for screening mammogram for malignant neoplasm of breast: Secondary | ICD-10-CM | POA: Diagnosis not present

## 2013-07-02 MED ORDER — SOLIFENACIN SUCCINATE 10 MG PO TABS: 10.0000 mg | ORAL_TABLET | Freq: Every day | ORAL | Status: DC

## 2013-07-05 DIAGNOSIS — G44219 Episodic tension-type headache, not intractable: Secondary | ICD-10-CM | POA: Diagnosis not present

## 2013-07-05 DIAGNOSIS — G43009 Migraine without aura, not intractable, without status migrainosus: Secondary | ICD-10-CM | POA: Diagnosis not present

## 2013-07-10 ENCOUNTER — Other Ambulatory Visit: Payer: Self-pay | Admitting: Internal Medicine

## 2013-07-10 DIAGNOSIS — N6459 Other signs and symptoms in breast: Secondary | ICD-10-CM

## 2013-07-10 DIAGNOSIS — M2550 Pain in unspecified joint: Secondary | ICD-10-CM

## 2013-07-13 ENCOUNTER — Telehealth: Payer: Self-pay | Admitting: Internal Medicine

## 2013-07-13 NOTE — Telephone Encounter (Signed)
[facility] called and stated that they will not be able to complete the order for a breast MRI. They will need the order sent to the breast center. Please assist.

## 2013-07-13 NOTE — Telephone Encounter (Signed)
DISREGARD PREVIOUS MESSAGE. [facility] HAS MADE NECESSARY CHANGES.

## 2013-07-13 NOTE — Addendum Note (Signed)
Addended by: Alfred Levins D on: 07/13/2013 08:25 AM   Modules accepted: Orders

## 2013-07-24 ENCOUNTER — Encounter: Payer: Self-pay | Admitting: Internal Medicine

## 2013-07-25 ENCOUNTER — Ambulatory Visit (INDEPENDENT_AMBULATORY_CARE_PROVIDER_SITE_OTHER): Payer: Medicare Other

## 2013-07-25 DIAGNOSIS — Z23 Encounter for immunization: Secondary | ICD-10-CM | POA: Diagnosis not present

## 2013-07-25 MED ORDER — MIRABEGRON ER 25 MG PO TB24: 25.0000 mg | ORAL_TABLET | Freq: Every day | ORAL | Status: DC

## 2013-07-26 DIAGNOSIS — D235 Other benign neoplasm of skin of trunk: Secondary | ICD-10-CM | POA: Diagnosis not present

## 2013-08-01 ENCOUNTER — Ambulatory Visit
Admission: RE | Admit: 2013-08-01 | Discharge: 2013-08-01 | Disposition: A | Discharge status: Home / Self Care | Payer: BLUE CROSS/BLUE SHIELD | Source: Ambulatory Visit | Attending: Internal Medicine | Admitting: Internal Medicine

## 2013-08-01 ENCOUNTER — Other Ambulatory Visit: Payer: Self-pay | Admitting: Internal Medicine

## 2013-08-01 DIAGNOSIS — N6459 Other signs and symptoms in breast: Secondary | ICD-10-CM

## 2013-08-01 DIAGNOSIS — R928 Other abnormal and inconclusive findings on diagnostic imaging of breast: Secondary | ICD-10-CM

## 2013-08-01 DIAGNOSIS — R922 Inconclusive mammogram: Secondary | ICD-10-CM | POA: Diagnosis not present

## 2013-08-01 MED ORDER — GADOBENATE DIMEGLUMINE 529 MG/ML IV SOLN
17.0000 mL | Freq: Once | INTRAVENOUS | Status: AC | PRN
  Administered 2013-08-01: 17 mL via INTRAVENOUS

## 2013-08-02 DIAGNOSIS — B351 Tinea unguium: Secondary | ICD-10-CM | POA: Diagnosis not present

## 2013-08-02 DIAGNOSIS — M79609 Pain in unspecified limb: Secondary | ICD-10-CM | POA: Diagnosis not present

## 2013-08-03 ENCOUNTER — Encounter: Payer: Self-pay | Admitting: Internal Medicine

## 2013-08-03 ENCOUNTER — Other Ambulatory Visit: Payer: Self-pay | Admitting: *Deleted

## 2013-08-03 NOTE — Telephone Encounter (Signed)
Spoke with female and he is unsure of the name of the medication.  He will find out and send the name of the medication through a my chart message

## 2013-08-07 MED ORDER — AMOXICILLIN 500 MG PO CAPS: ORAL_CAPSULE | ORAL | Status: DC

## 2013-08-08 ENCOUNTER — Ambulatory Visit
Admission: RE | Admit: 2013-08-08 | Discharge: 2013-08-08 | Disposition: A | Discharge status: Home / Self Care | Payer: BLUE CROSS/BLUE SHIELD | Source: Ambulatory Visit | Attending: Internal Medicine | Admitting: Internal Medicine

## 2013-08-08 DIAGNOSIS — D0590 Unspecified type of carcinoma in situ of unspecified breast: Secondary | ICD-10-CM | POA: Diagnosis not present

## 2013-08-08 DIAGNOSIS — R928 Other abnormal and inconclusive findings on diagnostic imaging of breast: Secondary | ICD-10-CM | POA: Diagnosis not present

## 2013-08-08 DIAGNOSIS — C50919 Malignant neoplasm of unspecified site of unspecified female breast: Secondary | ICD-10-CM

## 2013-08-08 DIAGNOSIS — C50519 Malignant neoplasm of lower-outer quadrant of unspecified female breast: Secondary | ICD-10-CM | POA: Diagnosis not present

## 2013-08-08 HISTORY — DX: Malignant neoplasm of unspecified site of unspecified female breast: C50.919

## 2013-08-08 MED ORDER — GADOBENATE DIMEGLUMINE 529 MG/ML IV SOLN
17.0000 mL | Freq: Once | INTRAVENOUS | Status: AC | PRN
  Administered 2013-08-08: 17 mL via INTRAVENOUS

## 2013-08-09 ENCOUNTER — Telehealth: Payer: Self-pay | Admitting: *Deleted

## 2013-08-09 ENCOUNTER — Other Ambulatory Visit: Payer: Self-pay | Admitting: Internal Medicine

## 2013-08-09 ENCOUNTER — Encounter: Payer: Self-pay | Admitting: Internal Medicine

## 2013-08-09 ENCOUNTER — Ambulatory Visit
Admission: RE | Admit: 2013-08-09 | Discharge: 2013-08-09 | Disposition: A | Discharge status: Home / Self Care | Payer: BLUE CROSS/BLUE SHIELD | Source: Ambulatory Visit | Attending: Internal Medicine | Admitting: Internal Medicine

## 2013-08-09 DIAGNOSIS — C50919 Malignant neoplasm of unspecified site of unspecified female breast: Secondary | ICD-10-CM | POA: Diagnosis not present

## 2013-08-09 DIAGNOSIS — Z803 Family history of malignant neoplasm of breast: Secondary | ICD-10-CM

## 2013-08-09 DIAGNOSIS — C50511 Malignant neoplasm of lower-outer quadrant of right female breast: Secondary | ICD-10-CM | POA: Insufficient documentation

## 2013-08-09 NOTE — Telephone Encounter (Signed)
Confirmed [facility] for 08/15/13 at 1200.  Instructions and contact information given.   

## 2013-08-10 ENCOUNTER — Encounter: Payer: Self-pay | Admitting: Internal Medicine

## 2013-08-10 DIAGNOSIS — M19079 Primary osteoarthritis, unspecified ankle and foot: Secondary | ICD-10-CM | POA: Diagnosis not present

## 2013-08-10 MED ORDER — MIRABEGRON ER 25 MG PO TB24: 25.0000 mg | ORAL_TABLET | Freq: Every day | ORAL | Status: DC

## 2013-08-14 DIAGNOSIS — M19079 Primary osteoarthritis, unspecified ankle and foot: Secondary | ICD-10-CM | POA: Diagnosis not present

## 2013-08-15 ENCOUNTER — Other Ambulatory Visit (HOSPITAL_BASED_OUTPATIENT_CLINIC_OR_DEPARTMENT_OTHER): Payer: Medicare Other | Admitting: Lab

## 2013-08-15 ENCOUNTER — Encounter: Payer: Self-pay | Admitting: *Deleted

## 2013-08-15 ENCOUNTER — Ambulatory Visit
Admission: RE | Admit: 2013-08-15 | Discharge: 2013-08-15 | Disposition: A | Discharge status: Home / Self Care | Payer: Medicare Other | Source: Ambulatory Visit | Attending: Radiation Oncology | Admitting: Radiation Oncology

## 2013-08-15 ENCOUNTER — Ambulatory Visit: Payer: Medicare Other | Attending: General Surgery | Admitting: Physical Therapy

## 2013-08-15 ENCOUNTER — Ambulatory Visit (HOSPITAL_BASED_OUTPATIENT_CLINIC_OR_DEPARTMENT_OTHER): Payer: Medicare Other | Admitting: Oncology

## 2013-08-15 ENCOUNTER — Ambulatory Visit: Payer: Medicare Other

## 2013-08-15 ENCOUNTER — Ambulatory Visit (HOSPITAL_BASED_OUTPATIENT_CLINIC_OR_DEPARTMENT_OTHER): Payer: Medicare Other | Admitting: General Surgery

## 2013-08-15 ENCOUNTER — Encounter: Payer: Self-pay | Admitting: Oncology

## 2013-08-15 VITALS — BP 121/77 | HR 71 | Temp 97.9°F | Resp 20 | Ht 59.0 in | Wt 187.6 lb

## 2013-08-15 DIAGNOSIS — C50519 Malignant neoplasm of lower-outer quadrant of unspecified female breast: Secondary | ICD-10-CM

## 2013-08-15 DIAGNOSIS — R293 Abnormal posture: Secondary | ICD-10-CM | POA: Diagnosis not present

## 2013-08-15 DIAGNOSIS — Z170 Estrogen receptor positive status [ER+]: Secondary | ICD-10-CM | POA: Diagnosis not present

## 2013-08-15 DIAGNOSIS — IMO0001 Reserved for inherently not codable concepts without codable children: Secondary | ICD-10-CM | POA: Diagnosis not present

## 2013-08-15 DIAGNOSIS — C50919 Malignant neoplasm of unspecified site of unspecified female breast: Secondary | ICD-10-CM

## 2013-08-15 DIAGNOSIS — C50511 Malignant neoplasm of lower-outer quadrant of right female breast: Secondary | ICD-10-CM

## 2013-08-15 DIAGNOSIS — C50911 Malignant neoplasm of unspecified site of right female breast: Secondary | ICD-10-CM

## 2013-08-15 DIAGNOSIS — Z96659 Presence of unspecified artificial knee joint: Secondary | ICD-10-CM | POA: Insufficient documentation

## 2013-08-15 LAB — CBC WITH DIFFERENTIAL/PLATELET
Eosinophils Absolute: 0.1 10*3/uL (ref 0.0–0.5)
HCT: 41.3 % (ref 34.8–46.6)
LYMPH%: 22.9 % (ref 14.0–49.7)
MONO#: 0.9 10*3/uL (ref 0.1–0.9)
NEUT#: 8.7 10*3/uL — ABNORMAL HIGH (ref 1.5–6.5)
NEUT%: 69.5 % (ref 38.4–76.8)
Platelets: 225 10*3/uL (ref 145–400)
RBC: 4.63 10*6/uL (ref 3.70–5.45)
RDW: 13.5 % (ref 11.2–14.5)
WBC: 12.5 10*3/uL — ABNORMAL HIGH (ref 3.9–10.3)

## 2013-08-15 LAB — COMPREHENSIVE METABOLIC PANEL (CC13)
Albumin: 3.6 g/dL (ref 3.5–5.0)
CO2: 24 mEq/L (ref 22–29)
Calcium: 9.3 mg/dL (ref 8.4–10.4)
Chloride: 104 mEq/L (ref 98–109)
Creatinine: 0.7 mg/dL (ref 0.6–1.1)
Glucose: 134 mg/dl (ref 70–140)
Sodium: 138 mEq/L (ref 136–145)
Total Bilirubin: 0.6 mg/dL (ref 0.20–1.20)
Total Protein: 6.8 g/dL (ref 6.4–8.3)

## 2013-08-15 NOTE — Progress Notes (Signed)
Checked in new pt with no financial concerns. Pt also has [facility] only.  I scanned card but did not add in the ins field.

## 2013-08-15 NOTE — Progress Notes (Signed)
ID: [name] OB: Mar 02, 1941  MR#: [id number]  WUJ#:[id number]  PCP: Thomos Lemons, DO GYN:   SU: Almond Lint OTHER MD: Chipper Herb  CHIEF COMPLAINT: "I have breast cancer".  HISTORY OF PRESENT ILLNESS: Jean Nash had routine screening mammography age 72 2014 showing no suspicious findings. However she was found to have breast density category C. She research this, was alarmed by the fact that mammographic sensitivity is significantly decreased when the breasts are dense, and given her family history of breast cancer she opted for proceeding to bilateral breast MRI. This was performed at [facility] imaging 08/01/2013. It showed in the right breast an area of nodular and linear enhancement spanning approximately 8.4 cm. There were no abnormal appearing lymph nodes and the left breast was unremarkable.  Biopsy of an area around the middle of the abnormal section of the right breast on 08/08/2013, showed ([id number]) an invasive and in situ ductal carcinoma, the invasive tumor being grade 1, estrogen receptor 90% positive, progesterone receptor 90% positive, with an MIB-105%. HER-2 testing is pending.  The patient's subsequent history is as detailed below   INTERVAL HISTORY: Jean Nash was evaluated at the multidisciplinary breast cancer clinic 08/15/2013 accompanied by her husband [name]  REVIEW OF SYSTEMS: She was having no unusual symptoms leading to the screening mammogram, which was routine. She does have significant orthopedic problems, and recently had a "shot" of cortisone in the right foot, which is helping some. She tends to have chronic insomnia problems. Her teeth are not in good shape she tells me. She is deconditioned and get short of breath when walking stairs, although she can't it on or exercise bike and do 30 minutes at a time when she's not having me or foot problems. She has chronic heartburn issues. She has stress urinary incontinence chronically. She has a history of cluster  headaches. She is anxious but not depressed. 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 Otherwise a detailed review systems today was noncontributory  PAST MEDICAL HISTORY: Past Medical History  Diagnosis Date  . Chest pain, atypical     Negative stress test 02/2009  . Other and unspecified hyperlipidemia   . Personal history of urinary calculi   . Pain in joint, shoulder region   . Irritable bowel syndrome   . Chronic insomnia   . GERD (gastroesophageal reflux disease)   . History of colonic polyps   . Fibromyalgia   . Hypothyroidism   . Other abnormal glucose   . Stress incontinence, female   . Macular degeneration   . Cluster headaches     history of migraines  . Meniscus tear     history of right knee torn meniscus  . Asthma     hx of -no inhalers  . Arthritis   . Kidney stone   . Cataract   . Breast cancer     PAST SURGICAL HISTORY: Past Surgical History  Procedure Laterality Date  . Appendectomy    . Cholecystectomy    . Abdominal hysterectomy    . Tonsillectomy    . Eye surgery      to repair macular hole  . Cataract extraction, bilateral    . Knee arthroscopy      > 10 years ago  . Joint replacement  03/15/11    left knee replacement  . Colonoscopy    . Ganglion cyst excision      rt foot  . Foot arthroplasty      lt   . Mass excision  11/04/2011    Procedure: EXCISION MASS;  Surgeon: Wyn Forster., MD;  Location: [facility];  Service: Orthopedics;  Laterality: Right;  excisional biopsy right ulna mass  . Hemorrhoid surgery      03/1993  . Breast cyst aspiration      9 cysts  . Toe surgery      preventative crossover toe surg/right foot  . Toe surgery      left foot/screw  in 2nd toe  . Upper gastrointestinal endoscopy    . Skin tags removed      breast, panty line, neckline    FAMILY HISTORY Family History  Problem Relation Age of Onset  . Stroke Mother     died age 84  . Diabetes Mother   . Breast cancer Mother   .  Breast cancer Sister   . Breast cancer Paternal Aunt   . Diabetes Maternal Grandfather   . Breast cancer Paternal Grandmother   . Breast cancer Paternal Aunt    the patient's father lived to be 24. The patient's mother was diagnosed with breast cancer at age 50. She died at 62 from unrelated causes. The patient had no brothers, one sister there are multiple second degree relatives with breast cancer, but no history of ovarian cancer in the family.  GYNECOLOGIC HISTORY:  Menarche age 73, first live birth age 30, she is GX P3. She had a hysterectomy at age 89. She did not take hormone replacement. She took birth control perhaps for 2 years, in the mid-77s. She did not have any complications from that.  SOCIAL HISTORY:  She is a retired Catering manager. Her husband [name] use to be a Engineer, building services. Daughter Eron Goble is a pediatrician in [place]. Daughter Louellen Molder is a Warden/ranger in [place]. DaughterShashana Hermelinda Medicus is on all her and of course a therapist living in [place]. The patient has 12 grandchildren. She attends the local synagogue    ADVANCED DIRECTIVES: In place   HEALTH MAINTENANCE: History  Substance Use Topics  . Smoking status: Former Games developer  . Smokeless tobacco: Never Used  . Alcohol Use: No     Colonoscopy: April 2014 PAP: Status post hysterectomy  Bone density: January 2014 Lipid panel:  No Known Allergies  Current Outpatient Prescriptions  Medication Sig Dispense Refill  . butalbital-acetaminophen-caffeine (FIORICET WITH CODEINE) 50-325-40-30 MG per capsule Take 1 capsule by mouth every 4 (four) hours as needed.  30 capsule  1  . calcium carbonate (OS-CAL) 600 MG TABS Take 600 mg by mouth 2 (two) times daily with a meal.        . cetirizine (ZYRTEC) 10 MG tablet Take 10 mg by mouth daily.      . citalopram (CELEXA) 20 MG tablet Take 0.5 tablets (10 mg total) by mouth daily.  90 tablet  1  . docusate sodium (STOOL SOFTENER) 100 MG capsule Take 100 mg  by mouth 3 (three) times daily as needed.       Marland Kitchen esomeprazole (NEXIUM) 40 MG capsule Take 1 capsule (40 mg total) by mouth daily.  90 capsule  1  . levothyroxine (SYNTHROID, LEVOTHROID) 50 MCG tablet Take 1 tablet (50 mcg total) by mouth daily.  90 tablet  3  . metFORMIN (GLUCOPHAGE) 1000 MG tablet Take 1 tablet (1,000 mg total) by mouth 2 (two) times daily with a meal.  180 tablet  3  . mirabegron ER (MYRBETRIQ) 25 MG TB24 tablet Take 1 tablet (25 mg total) by mouth  daily.  90 tablet  3  . pregabalin (LYRICA) 75 MG capsule Take 1 capsule (75 mg total) by mouth 2 (two) times daily.  180 capsule  1  . Probiotic Product (ACIDOPHILUS PROBIOTIC BLEND) CAPS Take by mouth daily.        . simvastatin (ZOCOR) 10 MG tablet Take 1 tablet (10 mg total) by mouth at bedtime.  90 tablet  1  . temazepam (RESTORIL) 7.5 MG capsule Take 1 capsule (7.5 mg total) by mouth at bedtime as needed.  90 capsule  1  . albuterol (PROVENTIL HFA;VENTOLIN HFA) 108 (90 BASE) MCG/ACT inhaler Inhale 2 puffs into the lungs every 6 (six) hours as needed for wheezing.  3 Inhaler  3  . amoxicillin (AMOXIL) 500 MG capsule Take 4 capsules 1 hour before dental procedures  4 capsule  1  . diazepam (VALIUM) 5 MG tablet Take 1 tablet (5 mg total) by mouth every 6 (six) hours as needed.  30 tablet  3  . diclofenac sodium (VOLTAREN) 1 % GEL Apply 1 application topically as needed.      Marland Kitchen HYDROcodone-acetaminophen (LORTAB) 7.5-500 MG per tablet 1/2 to one tablet every 8 hrs as needed  30 tablet  0   No current facility-administered medications for this visit.    OBJECTIVE: Middle-aged white woman who appears stated age  39 Vitals:   08/15/13 1253  BP: 121/77  Pulse: 71  Temp: 97.9 F (36.6 C)  Resp: 20     Body mass index is 37.87 kg/(m^2).    ECOG FS:1 - Symptomatic but completely ambulatory  Ocular: Sclerae unicteric, pupils equal, round and reactive to light Ear-nose-throat: Oropharynx clear, dentition poor Lymphatic: No  cervical or supraclavicular adenopathy Lungs no rales or rhonchi, good excursion bilaterally Heart regular rate and rhythm, no murmur appreciated Abd soft, nontender, positive bowel sounds MSK no focal spinal tenderness, no upper extremity lymphedema Neuro: non-focal, well-oriented, appropriate affect Breasts: The right breast is status post recent biopsy. There is a moderate ecchymosis. I do not palpate a well-defined mass. There is no skin or nipple change of concern. The right axilla is benign. The left breast is unremarkable.   LAB RESULTS:  CMP     Component Value Date/Time   NA 138 08/15/2013 1225   NA 138 06/22/2013 1618   K 3.8 08/15/2013 1225   K 4.2 06/22/2013 1618   CL 103 06/22/2013 1618   CO2 24 08/15/2013 1225   CO2 26 06/22/2013 1618   GLUCOSE 134 08/15/2013 1225   GLUCOSE 86 06/22/2013 1618   BUN 13.7 08/15/2013 1225   BUN 17 06/22/2013 1618   CREATININE 0.7 08/15/2013 1225   CREATININE 0.70 06/22/2013 1618   CREATININE 0.7 03/15/2013 0829   CALCIUM 9.3 08/15/2013 1225   CALCIUM 9.4 06/22/2013 1618   PROT 6.8 08/15/2013 1225   PROT 6.3 06/22/2013 1618   ALBUMIN 3.6 08/15/2013 1225   ALBUMIN 4.0 06/22/2013 1618   AST 14 08/15/2013 1225   AST 13 06/22/2013 1618   ALT 27 08/15/2013 1225   ALT 19 06/22/2013 1618   ALKPHOS 66 08/15/2013 1225   ALKPHOS 58 06/22/2013 1618   BILITOT 0.60 08/15/2013 1225   BILITOT 0.5 06/22/2013 1618   GFRNONAA 84* 11/03/2011 1400   GFRAA >90 11/03/2011 1400    I No results found for this basename: SPEP, UPEP,  kappa and lambda light chains    Lab Results  Component Value Date   WBC 12.5* 08/15/2013   NEUTROABS 8.7*  08/15/2013   HGB 13.7 08/15/2013   HCT 41.3 08/15/2013   MCV 89.2 08/15/2013   PLT 225 08/15/2013      Chemistry      Component Value Date/Time   NA 138 08/15/2013 1225   NA 138 06/22/2013 1618   K 3.8 08/15/2013 1225   K 4.2 06/22/2013 1618   CL 103 06/22/2013 1618   CO2 24 08/15/2013 1225   CO2 26 06/22/2013 1618   BUN 13.7 08/15/2013 1225   BUN 17  06/22/2013 1618   CREATININE 0.7 08/15/2013 1225   CREATININE 0.70 06/22/2013 1618   CREATININE 0.7 03/15/2013 0829      Component Value Date/Time   CALCIUM 9.3 08/15/2013 1225   CALCIUM 9.4 06/22/2013 1618   ALKPHOS 66 08/15/2013 1225   ALKPHOS 58 06/22/2013 1618   AST 14 08/15/2013 1225   AST 13 06/22/2013 1618   ALT 27 08/15/2013 1225   ALT 19 06/22/2013 1618   BILITOT 0.60 08/15/2013 1225   BILITOT 0.5 06/22/2013 1618       No results found for this basename: LABCA2    No components found with this basename: LABCA125    No results found for this basename: INR,  in the last 168 hours  Urinalysis    Component Value Date/Time   COLORURINE YELLOW 03/10/2011 1130   APPEARANCEUR CLEAR 03/10/2011 1130   LABSPEC 1.013 03/10/2011 1130   PHURINE 6.0 03/10/2011 1130   GLUCOSEU NEGATIVE 03/10/2011 1130   HGBUR NEGATIVE 03/10/2011 1130   BILIRUBINUR n 07/04/2012 1642   BILIRUBINUR NEGATIVE 03/10/2011 1130   KETONESUR NEGATIVE 03/10/2011 1130   PROTEINUR NEGATIVE 03/10/2011 1130   UROBILINOGEN 0.2 07/04/2012 1642   UROBILINOGEN 0.2 03/10/2011 1130   NITRITE n 07/04/2012 1642   NITRITE NEGATIVE 03/10/2011 1130   LEUKOCYTESUR small (1+) 07/04/2012 1642    STUDIES: Mr Breast Bilateral W Wo Contrast  08/01/2013   *RADIOLOGY REPORT*  Clinical Data:72 year old female with dense fibroglandular tissue. Family history of breast cancer in her mother and sister.  BILATERAL BREAST MRI WITH AND WITHOUT CONTRAST  Technique: Multiplanar, multisequence MR images of both breasts were obtained prior to and following the intravenous administration of 17ml of multihance.  THREE-DIMENSIONAL MR IMAGE RENDERING ON INDEPENDENT WORKSTATION: Three-dimensional MR images were rendered by post-processing  of the original MR data on an independent DynaCad workstation. The three-dimensional MR images were interpreted, and findings are reported in the following complete MRI report for this study.  Comparison:  Recent imaging examinations.   FINDINGS:  Breast composition:  c:  Heterogeneous fibroglandular tissue  Background parenchymal enhancement: Moderate enhancing fibronodular pattern.  Right breast:  There is nodular and linear asymmetric enhancement in the lower outer quadrant of the right breast spanning approximately 8.4 cm.  Left breast:  No mass or abnormal enhancement.  Lymph nodes:  No abnormal appearing lymph nodes.  Ancillary findings:  None.  IMPRESSION: Suspicious enhancement in the lower outer quadrant of the right breast.  RECOMMENDATION: MR guided core biopsy of the right breast is recommended.  This will be scheduled at the patient's convenience.  BI-RADS CATEGORY 4:  Suspicious abnormality - biopsy should be considered.   Original Report Authenticated By: Baird Lyons, M.D.   Mm Digital Diagnostic Unilat R  08/08/2013   *RADIOLOGY REPORT*  Clinical Data:  Strong family history breast cancer.  Dense fibroglandular tissue.  Abnormal MRI.  Status post MR guided core biopsy of the right breast.  DIGITAL DIAGNOSTIC RIGHT MAMMOGRAM  Comparison:  Previous  exams.  Findings:  Mammographic images were obtained following MR guided biopsy of the the lower outer quadrant of the right breast. Mammographic images show there is a cylindrical clip in the lower outer quadrant of the right breast.  IMPRESSION: Status post MR guided core biopsy of the right breast with pathology pending.  Final Assessment:  Post Procedure Mammograms for Marker Placement   Original Report Authenticated By: Baird Lyons, M.D.   Mm Radiologist Eval And Mgmt  08/09/2013   EXAM: ESTABLISHED PATIENT OFFICE VISIT -LEVEL II (16109)  HISTORY OF PRESENT ILLNESS: Strong family history of breast cancer for which the patient underwent screening MRI. The screening MRI demonstrated an approximate 8.4 cm focus of non mass linear enhancement which was biopsied yesterday.  CHIEF COMPLAINT: Patient returns for pathology results after MRI guided biopsy yesterday.  PHYSICAL EXAMINATION:  Evaluation of the biopsy site showed no there is evidence of bleeding. The breast is soft in this region without evidence of hematoma.  ASSESSMENT AND PLAN: Final pathology returned as invasive mammary cancer and mammary carcinoma in-situ ductal. This is concordant with imaging findings. These results were discussed with the patient and her husband all of her questions were answered.  She has been scheduled for the breast cancer multidisciplinary clinic at the [facility] on Wednesday, October 1.   Electronically Signed   By: Hulan Saas   On: 08/09/2013 15:16   Mr Rt Breast Bx Jones Bales Dev 1st Lesion Image Bx Spec Mr Guide  08/08/2013   *RADIOLOGY REPORT*  Clinical Data:  Strong family history of breast cancer.  Dense fibroglandular tissue.  Abnormal right breast MRI.  MRI GUIDED VACUUM ASSISTED BIOPSY OF THE RIGHT BREAST WITHOUT AND WITH CONTRAST  Comparison: With priors  Technique: Multiplanar, multisequence MR images of the right breast were obtained prior to and following the intravenous administration of 17 ml of Mulithance.  I met with the patient, and we discussed the procedure of MRI guided biopsy, including risks, benefits, and alternatives. Specifically, we discussed the risks of infection, bleeding, tissue injury, clip migration, and inadequate sampling.  Informed, written consent was given.  Using sterile technique, 2% Lidocaine, MRI guidance, and a 9 gauge vacuum assisted device, biopsy was performed of abnormal enhancement the lower outer quadrant of the right breast using a lateral approach.  At the conclusion of the procedure, a cylindrical shaped tissue marker clip was deployed into the biopsy cavity.  IMPRESSION: MRI guided biopsy of the right breast. No apparent complications.  THREE-DIMENSIONAL MR IMAGE RENDERING ON INDEPENDENT WORKSTATION:  Three-dimensional MR images were rendered by post-processing of the original MR data on a DynaCad workstation.  The three-dimensional MR  images were interpreted, and findings were reported in the accompanying complete MRI report for this study.   Original Report Authenticated By: Baird Lyons, M.D.    ASSESSMENT: 72 y.o. [place] woman status post right breast lower outer quadrant biopsy 08/08/2013 for a clinical Te N0, stage IIA invasive ductal carcinoma, grade 1, estrogen receptor 90% positive, progesterone receptor 90% positive, with an MIB-1 of 5% and HER-2 determination pending  PLAN: We spent the better part of today's 45 minute appointment discussing the biology of breast cancer in general, and the specifics of the patient's tumor in particular. Jean Nash understands it is difficult to know how much of the large abnormal area in the breast is invasive and how much noninvasive. This could be a stage I tumor or a stage II, unlikely to be stage III, but  of course if 1 lymph nodes positive and the tumor is larger than 5 cm, then this might be a stage III breast cancer.  The prognostic determinants however are very favorable. This looks very much like a "luminal a" subtype of breast cancer, and these cancers, even if large, tend to not benefit greatly or at all from chemotherapy. For that reason we are going to send an Oncotype in her case. She understands that this test will help [name] determine her prognosis and particularly, to benefit if any she would get from chemotherapy as part of her systemic treatment.  Given her Ashkenazi background she has decided to proceed to bilateral mastectomies, and I think this is a reasonable decision. We will make sure she has appropriate genetic testing, and of course if she proves to be BRCA1 or 2 positive we will recommend bilateral salpingo-oophorectomy.  Otherwise she will return to see me approximately 3 weeks after her definitive surgery, by which time we will have the final pathology and the Oncotype results, and should be able to make appropriate decisions regarding her systemic and local  treatment. She knows to call for any problems that may develop the next visit      Lowella Dell, MD   08/15/2013 3:23 PM

## 2013-08-15 NOTE — Assessment & Plan Note (Signed)
Patient is a 72 year old female with a new diagnosis of right breast cancer. This is a linear area, but she is not interested in pursuing breast conservation. She has such a strong history of breast cancer in her family, that she desires bilateral mastectomy. She is of Ashkenazi Jewish descent.  She will need to be referred to genetics. She also will need an Oncotype.    She does not read desire reconstruction. She has been offered referral to a Engineer, petroleum to discuss and declines.  She saw physical therapy to assess her mobility preoperatively.  We'll plan to do bilateral mastectomies with right-sided sentinel lymph node biopsy.  I discussed the risks with the patient including bleeding, infection, seroma, nerve damage, pain, blood clots, other.  I reviewed the course of surgery including the sentinel lymph node injection preoperatively. I reviewed the location of the incisions and what the surgery would entail.  I reviewed the risk of breast cancer recurrence.  She understands and wishes to proceed.  45 min spent in evaluation, examination, counseling, and coordination of care.  >50% spent in counseling.

## 2013-08-15 NOTE — Progress Notes (Signed)
[facility] Radiation Oncology NEW PATIENT EVALUATION  Name: Jean Nash MRN: [id number]  Date:   08/15/2013           DOB: [birth year]  Status: outpatient   CC: Thomos Lemons, DO  Almond Lint, MD    REFERRING PHYSICIAN: Almond Lint, MD   DIAGNOSIS:  Invasive and in situ mammary carcinoma of the right breast (stage pending)  HISTORY OF PRESENT ILLNESS:  Jean Nash is a 72 y.o. female who is seen today at the [facility] for the courtesy of Dr. Donell Beers for evaluation of her invasive mammary and in situ carcinoma of the right breast. Mammography on 07/02/2013 was without evidence for malignancy. She is found to have a heterogeneous dense breast, and she sought to have a MR scan. MR on 08/01/2013 showed an area of nodular and linear asymmetric enhancement in the lower outer quadrant of the right breast spanning 8.4 cm. There were no abnormal appearing lymph nodes. A biopsy on 08/08/2013 was diagnostic for invasive and in situ mammary carcinoma, 90% ER positive and 90% PR positive with a low Ki-67 of 5%. She indicated that she wanted bilateral mastectomies. She is not interested in reconstruction.  PREVIOUS RADIATION THERAPY: No   PAST MEDICAL HISTORY:  has a past medical history of Chest pain, atypical; Other and unspecified hyperlipidemia; Personal history of urinary calculi; Pain in joint, shoulder region; Irritable bowel syndrome; Chronic insomnia; GERD (gastroesophageal reflux disease); History of colonic polyps; Fibromyalgia; Hypothyroidism; Other abnormal glucose; Stress incontinence, female; Macular degeneration; Cluster headaches; Meniscus tear; Asthma; Arthritis; Kidney stone; Cataract; and Breast cancer.     PAST SURGICAL HISTORY:  Past Surgical History  Procedure Laterality Date  . Appendectomy    . Cholecystectomy    . Abdominal hysterectomy    . Tonsillectomy    . Eye surgery      to repair macular hole  . Cataract extraction, bilateral    . Knee arthroscopy       > 10 years ago  . Joint replacement  03/15/11    left knee replacement  . Colonoscopy    . Ganglion cyst excision      rt foot  . Foot arthroplasty      lt   . Mass excision  11/04/2011    Procedure: EXCISION MASS;  Surgeon: Wyn Forster., MD;  Location: [facility];  Service: Orthopedics;  Laterality: Right;  excisional biopsy right ulna mass  . Hemorrhoid surgery      03/1993  . Breast cyst aspiration      9 cysts  . Toe surgery      preventative crossover toe surg/right foot  . Toe surgery      left foot/screw  in 2nd toe  . Upper gastrointestinal endoscopy    . Skin tags removed      breast, panty line, neckline     FAMILY HISTORY: family history includes Breast cancer in her mother, paternal aunt, paternal aunt, paternal grandmother, and sister; Diabetes in her maternal grandfather and mother; Stroke in her mother. Her father died at age 68 in her mother died at age 76 from complications of diabetes mellitus. She was diagnosed with breast cancer in her early 91s.    SOCIAL HISTORY:  reports that she has quit smoking. She has never used smokeless tobacco. She reports that she does not drink alcohol or use illicit drugs. Married, 3 children, 12 grandchildren and 2 great-grandchildren.   ALLERGIES: Review of patient's allergies indicates  no known allergies.   MEDICATIONS:  Current Outpatient Prescriptions  Medication Sig Dispense Refill  . albuterol (PROVENTIL HFA;VENTOLIN HFA) 108 (90 BASE) MCG/ACT inhaler Inhale 2 puffs into the lungs every 6 (six) hours as needed for wheezing.  3 Inhaler  3  . amoxicillin (AMOXIL) 500 MG capsule Take 4 capsules 1 hour before dental procedures  4 capsule  1  . butalbital-acetaminophen-caffeine (FIORICET WITH CODEINE) 50-325-40-30 MG per capsule Take 1 capsule by mouth every 4 (four) hours as needed.  30 capsule  1  . calcium carbonate (OS-CAL) 600 MG TABS Take 600 mg by mouth 2 (two) times daily with a meal.         . cetirizine (ZYRTEC) 10 MG tablet Take 10 mg by mouth daily.      . citalopram (CELEXA) 20 MG tablet Take 0.5 tablets (10 mg total) by mouth daily.  90 tablet  1  . diazepam (VALIUM) 5 MG tablet Take 1 tablet (5 mg total) by mouth every 6 (six) hours as needed.  30 tablet  3  . diclofenac sodium (VOLTAREN) 1 % GEL Apply 1 application topically as needed.      . docusate sodium (STOOL SOFTENER) 100 MG capsule Take 100 mg by mouth 3 (three) times daily as needed.       Marland Kitchen esomeprazole (NEXIUM) 40 MG capsule Take 1 capsule (40 mg total) by mouth daily.  90 capsule  1  . HYDROcodone-acetaminophen (LORTAB) 7.5-500 MG per tablet 1/2 to one tablet every 8 hrs as needed  30 tablet  0  . levothyroxine (SYNTHROID, LEVOTHROID) 50 MCG tablet Take 1 tablet (50 mcg total) by mouth daily.  90 tablet  3  . metFORMIN (GLUCOPHAGE) 1000 MG tablet Take 1 tablet (1,000 mg total) by mouth 2 (two) times daily with a meal.  180 tablet  3  . mirabegron ER (MYRBETRIQ) 25 MG TB24 tablet Take 1 tablet (25 mg total) by mouth daily.  90 tablet  3  . pregabalin (LYRICA) 75 MG capsule Take 1 capsule (75 mg total) by mouth 2 (two) times daily.  180 capsule  1  . Probiotic Product (ACIDOPHILUS PROBIOTIC BLEND) CAPS Take by mouth daily.        . simvastatin (ZOCOR) 10 MG tablet Take 1 tablet (10 mg total) by mouth at bedtime.  90 tablet  1  . temazepam (RESTORIL) 7.5 MG capsule Take 1 capsule (7.5 mg total) by mouth at bedtime as needed.  90 capsule  1   No current facility-administered medications for this encounter.     REVIEW OF SYSTEMS:  Pertinent items are noted in HPI.    PHYSICAL EXAM:  Alert and oriented 72 year old white female appearing her stated age.  Wt Readings from Last 3 Encounters:  08/15/13 187 lb 9.6 oz (85.095 kg)  06/22/13 183 lb (83.008 kg)  02/21/13 183 lb (83.008 kg)   Temp Readings from Last 3 Encounters:  08/15/13 97.9 F (36.6 C) Oral  06/22/13 98 F (36.7 C) Oral  02/21/13 97.9 F (36.6  C) Oral   BP Readings from Last 3 Encounters:  08/15/13 121/77  06/22/13 116/74  02/21/13 134/80   Pulse Readings from Last 3 Encounters:  08/15/13 71  06/22/13 72  02/21/13 68   And neck examination: Grossly unremarkable. Nodes: Without palpable cervical, supraclavicular, or axillary lymphadenopathy. Chest: Lungs clear. Heart: Regular in rhythm. Breasts: There is a punctate biopsy wound along the lower outer quadrant of the right breast at 7:00. No masses  are appreciated. Left breast without masses or lesions. Abdomen: Without hepatomegaly. Extremities: Without edema.    LABORATORY DATA:  Lab Results  Component Value Date   WBC 12.5* 08/15/2013   HGB 13.7 08/15/2013   HCT 41.3 08/15/2013   MCV 89.2 08/15/2013   PLT 225 08/15/2013   Lab Results  Component Value Date   NA 138 08/15/2013   K 3.8 08/15/2013   CL 103 06/22/2013   CO2 24 08/15/2013   Lab Results  Component Value Date   ALT 27 08/15/2013   AST 14 08/15/2013   ALKPHOS 66 08/15/2013   BILITOT 0.60 08/15/2013      IMPRESSION: Invasive and in situ mammary carcinoma of the right breast. She is difficult to stage, not knowing how extensive her invasive disease is. She does have what appears to be extensive involvement, and is probably best served by mastectomy.   PLAN: As discussed above.  I spent 30 minutes minutes face to face with the patient and more than 50% of that time was spent in counseling and/or coordination of care.

## 2013-08-15 NOTE — Progress Notes (Signed)
Chief complaint:  Right breast cancer  HISTORY: Patient is a 72-year-old female with a very extensive family history for breast cancer. She had a mother who had breast cancer at age 74, a sister with breast cancer at age 54 and a maternal grandmother with breast cancer at age 50. She also had 2 paternal aunts and a paternal grandmother who had breast cancer in their 50s and 60s.  Also her maternal grandmother had a form of intra-abdominal cancer. She has a maternal uncle and cousin with brain tumors. She actually had a normal screening MRI. However she got a letter in the mail stating that she had heterogeneously dense breasts and might benefit from an MRI. She stopped the MRI and was able to obtain an appointment. She was found to have an 8.4 cm area of abnormality.  MR biopsy demonstrated invasive mammary carcinoma with ER 90%, PR 90%, HER-2 not overexpressed, and Ki67 5%.    She had menarche at age 13. She no longer had periods after the age of 36 when she had a hysterectomy. She had 3 children with her first at age 21. She did use birth control pills for 2-3 years. She has not ever used hormone replacement. She is up-to-date with her colonoscopy and her bone density. She is a former smoker. She does not drink alcohol or use illicit drugs.  Past Medical History  Diagnosis Date  . Chest pain, atypical     Negative stress test 02/2009  . Other and unspecified hyperlipidemia   . Personal history of urinary calculi   . Pain in joint, shoulder region   . Irritable bowel syndrome   . Chronic insomnia   . GERD (gastroesophageal reflux disease)   . History of colonic polyps   . Fibromyalgia   . Hypothyroidism   . Other abnormal glucose   . Stress incontinence, female   . Macular degeneration   . Cluster headaches     history of migraines  . Meniscus tear     history of right knee torn meniscus  . Asthma     hx of -no inhalers  . Arthritis   . Kidney stone   . Cataract   . Breast cancer      Past Surgical History  Procedure Laterality Date  . Appendectomy    . Cholecystectomy    . Abdominal hysterectomy    . Tonsillectomy    . Eye surgery      to repair macular hole  . Cataract extraction, bilateral    . Knee arthroscopy      > 10 years ago  . Joint replacement  03/15/11    left knee replacement  . Colonoscopy    . Ganglion cyst excision      rt foot  . Foot arthroplasty      lt   . Mass excision  11/04/2011    Procedure: EXCISION MASS;  Surgeon: Robert V Sypher Jr., MD;  Location: [facility];  Service: Orthopedics;  Laterality: Right;  excisional biopsy right ulna mass  . Hemorrhoid surgery      03/1993  . Breast cyst aspiration      9 cysts  . Toe surgery      preventative crossover toe surg/right foot  . Toe surgery      left foot/screw  in 2nd toe  . Upper gastrointestinal endoscopy    . Skin tags removed      breast, panty line, neckline    Current Outpatient Prescriptions    Medication Sig Dispense Refill  . albuterol (PROVENTIL HFA;VENTOLIN HFA) 108 (90 BASE) MCG/ACT inhaler Inhale 2 puffs into the lungs every 6 (six) hours as needed for wheezing.  3 Inhaler  3  . amoxicillin (AMOXIL) 500 MG capsule Take 4 capsules 1 hour before dental procedures  4 capsule  1  . butalbital-acetaminophen-caffeine (FIORICET WITH CODEINE) 50-325-40-30 MG per capsule Take 1 capsule by mouth every 4 (four) hours as needed.  30 capsule  1  . calcium carbonate (OS-CAL) 600 MG TABS Take 600 mg by mouth 2 (two) times daily with a meal.        . cetirizine (ZYRTEC) 10 MG tablet Take 10 mg by mouth daily.      . citalopram (CELEXA) 20 MG tablet Take 0.5 tablets (10 mg total) by mouth daily.  90 tablet  1  . diazepam (VALIUM) 5 MG tablet Take 1 tablet (5 mg total) by mouth every 6 (six) hours as needed.  30 tablet  3  . diclofenac sodium (VOLTAREN) 1 % GEL Apply 1 application topically as needed.      . docusate sodium (STOOL SOFTENER) 100 MG capsule Take 100 mg  by mouth 3 (three) times daily as needed.       . esomeprazole (NEXIUM) 40 MG capsule Take 1 capsule (40 mg total) by mouth daily.  90 capsule  1  . HYDROcodone-acetaminophen (LORTAB) 7.5-500 MG per tablet 1/2 to one tablet every 8 hrs as needed  30 tablet  0  . levothyroxine (SYNTHROID, LEVOTHROID) 50 MCG tablet Take 1 tablet (50 mcg total) by mouth daily.  90 tablet  3  . metFORMIN (GLUCOPHAGE) 1000 MG tablet Take 1 tablet (1,000 mg total) by mouth 2 (two) times daily with a meal.  180 tablet  3  . mirabegron ER (MYRBETRIQ) 25 MG TB24 tablet Take 1 tablet (25 mg total) by mouth daily.  90 tablet  3  . pregabalin (LYRICA) 75 MG capsule Take 1 capsule (75 mg total) by mouth 2 (two) times daily.  180 capsule  1  . Probiotic Product (ACIDOPHILUS PROBIOTIC BLEND) CAPS Take by mouth daily.        . simvastatin (ZOCOR) 10 MG tablet Take 1 tablet (10 mg total) by mouth at bedtime.  90 tablet  1  . temazepam (RESTORIL) 7.5 MG capsule Take 1 capsule (7.5 mg total) by mouth at bedtime as needed.  90 capsule  1   No current facility-administered medications for this visit.     No Known Allergies   Family History  Problem Relation Age of Onset  . Stroke Mother     died age 87  . Diabetes Mother   . Breast cancer Mother   . Breast cancer Sister   . Breast cancer Paternal Aunt   . Diabetes Maternal Grandfather   . Breast cancer Paternal Grandmother   . Breast cancer Paternal Aunt      History   Social History  . Marital Status: Married    Spouse Name: N/A    Number of Children: N/A  . Years of Education: N/A   Social History Main Topics  . Smoking status: Former Smoker  . Smokeless tobacco: Never Used  . Alcohol Use: No  . Drug Use: No  . Sexual Activity: Not on file   Other Topics Concern  . Not on file   Social History Narrative   Occupation:  Retired bookkeeper    Married with 3 grown children        Never Smoked     Alcohol use-no          REVIEW OF SYSTEMS - PERTINENT  POSITIVES ONLY: 12 point review of systems negative other than HPI and PMH except for insomnia, foot/breast pain, dental issues, shortness of breath with stairs, heartburn, thyroid problems.    EXAM: Wt Readings from Last 3 Encounters:  08/15/13 187 lb 9.6 oz (85.095 kg)  06/22/13 183 lb (83.008 kg)  02/21/13 183 lb (83.008 kg)   Temp Readings from Last 3 Encounters:  08/15/13 97.9 F (36.6 C) Oral  06/22/13 98 F (36.7 C) Oral  02/21/13 97.9 F (36.6 C) Oral   BP Readings from Last 3 Encounters:  08/15/13 121/77  06/22/13 116/74  02/21/13 134/80   Pulse Readings from Last 3 Encounters:  08/15/13 71  06/22/13 72  02/21/13 68    Gen:  No acute distress.  Well nourished and well groomed.   Neurological: Alert and oriented to person, place, and time. Coordination normal.  Head: Normocephalic and atraumatic.  Eyes: Conjunctivae are normal. Pupils are equal, round, and reactive to light. No scleral icterus.  Neck: Normal range of motion. Neck supple. No tracheal deviation or thyromegaly present.  Cardiovascular: Normal rate, regular rhythm, normal heart sounds and intact distal pulses.  Exam reveals no gallop and no friction rub.  No murmur heard. Respiratory: Effort normal.  No respiratory distress. No chest wall tenderness. Breath sounds normal.  No wheezes, rales or rhonchi.  Breast:  Breasts are ptotic and symmetric bilaterally.  There is moderately dense breast tissue.  There are no palpable masses, skin dimpling, nipple retraction or discharge, no palpable adenopathy.  There are 2 transversely oriented scars in the upper outer quadrant of the breast.   GI: Soft. Bowel sounds are normal. The abdomen is soft and nontender.  There is no rebound and no guarding. Midline abdominal incision.   Musculoskeletal: Normal range of motion. Extremities are nontender.  Lymphadenopathy: No cervical, preauricular, postauricular or axillary adenopathy is present Skin: Skin is warm and dry. No  rash noted. No diaphoresis. No erythema. No pallor. No clubbing, cyanosis, or edema.   Psychiatric: Normal mood and affect. Behavior is normal. Judgment and thought content normal.    LABORATORY RESULTS: Available labs are reviewed  Pathology Diagnosis Breast, right, needle core biopsy, LOQ - INVASIVE AND IN SITU MAMMARY CARCINOMA. - SEE COMMENT. Microscopic Comment The carcinoma is grade 1 and is favored to be a ductal phenotype. A breast prognostic profile will be performed and the results reported separately. The results were called to the [facility] on 08/09/13. (JBK:kh 08/09/13) CBC with sl elevated WBCs at 12.5.  Otherwise, CBC, CMET essentially normal.     RADIOLOGY RESULTS: See E-Chart or I-Site for most recent results.  Images and reports are reviewed. Mammogram IMPRESSION:  No mammographic evidence of malignancy.  A result letter of this screening mammogram will be mailed directly  to the patient.  MRI IMPRESSION:  Suspicious enhancement in the lower outer quadrant of the right  breast.  RECOMMENDATION:  MR guided core biopsy of the right breast is recommended. This  will be scheduled at the patient's convenience.    ASSESSMENT AND PLAN: Breast cancer of lower-outer quadrant of right female breast Patient is a 72-year-old female with a new diagnosis of right breast cancer. This is a linear area, but she is not interested in pursuing breast conservation. She has such a strong history of breast cancer in her family, that she   desires bilateral mastectomy. She is of Ashkenazi Jewish descent.  She will need to be referred to genetics. She also will need an Oncotype.    She does not read desire reconstruction. She has been offered referral to a plastic surgeon to discuss and declines.  She saw physical therapy to assess her mobility preoperatively.  We'll plan to do bilateral mastectomies with right-sided sentinel lymph node biopsy.  I discussed the  risks with the patient including bleeding, infection, seroma, nerve damage, pain, blood clots, other.  I reviewed the course of surgery including the sentinel lymph node injection preoperatively. I reviewed the location of the incisions and what the surgery would entail.  I reviewed the risk of breast cancer recurrence.  She understands and wishes to proceed.  45 min spent in evaluation, examination, counseling, and coordination of care.  >50% spent in counseling.       Madisyn Mawhinney L Briggitte Boline MD Surgical Oncology, General and Endocrine Surgery [facility]      Visit Diagnoses: 1. Breast cancer, right   2. Breast cancer of lower-outer quadrant of right female breast     Primary Care Physician: Robert Yoo, DO   Magrinat, Gus Murray, Robert Stuart, Dawn.   

## 2013-08-16 ENCOUNTER — Encounter: Payer: Self-pay | Admitting: *Deleted

## 2013-08-16 ENCOUNTER — Ambulatory Visit (INDEPENDENT_AMBULATORY_CARE_PROVIDER_SITE_OTHER): Payer: Medicare Other | Admitting: Internal Medicine

## 2013-08-16 ENCOUNTER — Telehealth: Payer: Self-pay | Admitting: Oncology

## 2013-08-16 DIAGNOSIS — Z23 Encounter for immunization: Secondary | ICD-10-CM

## 2013-08-16 NOTE — Telephone Encounter (Signed)
S/w the pt and she is aware of her appt on 09/12/2013@4 :00pm with dr Darnelle Catalan.

## 2013-08-16 NOTE — Progress Notes (Signed)
[facility] Psychosocial Distress Screening Clinical Social Work  Clinical Social Work was referred by distress screening protocol.  The patient scored a 6 on the Psychosocial Distress Thermometer which indicates moderate distress. Clinical Social Worker phoned Pt at home to assess for distress and other psychosocial needs. CSW was on hold to talk with Pt and then Pt never answered the phone. CSW called again and left a vm. CSW will look out for Pt and try to follow up again soon.   Pt had declined wanting to speak with a Support Team Member at her clinic appt and CSW was calling to follow up on any concerns.    Doreen Salvage, LCSW Clinical Social Worker [facility] Wednesday, Thursday and Friday Phone: [phone] Fax: [phone]

## 2013-08-17 ENCOUNTER — Encounter: Payer: Self-pay | Admitting: Internal Medicine

## 2013-08-17 MED ORDER — METFORMIN HCL 1000 MG PO TABS: 1000.0000 mg | ORAL_TABLET | Freq: Two times a day (BID) | ORAL | Status: DC

## 2013-08-17 MED ORDER — MELOXICAM 7.5 MG PO TABS: 7.5000 mg | ORAL_TABLET | Freq: Every day | ORAL | Status: DC

## 2013-08-17 MED ORDER — MIRABEGRON ER 25 MG PO TB24: 25.0000 mg | ORAL_TABLET | Freq: Every day | ORAL | Status: DC

## 2013-08-17 MED ORDER — SOLIFENACIN SUCCINATE 10 MG PO TABS: 10.0000 mg | ORAL_TABLET | Freq: Every day | ORAL | Status: DC

## 2013-08-21 DIAGNOSIS — L408 Other psoriasis: Secondary | ICD-10-CM | POA: Diagnosis not present

## 2013-08-21 DIAGNOSIS — M2550 Pain in unspecified joint: Secondary | ICD-10-CM | POA: Diagnosis not present

## 2013-08-21 DIAGNOSIS — M1990 Unspecified osteoarthritis, unspecified site: Secondary | ICD-10-CM | POA: Diagnosis not present

## 2013-08-21 DIAGNOSIS — IMO0001 Reserved for inherently not codable concepts without codable children: Secondary | ICD-10-CM | POA: Diagnosis not present

## 2013-08-24 DIAGNOSIS — H3530 Unspecified macular degeneration: Secondary | ICD-10-CM | POA: Diagnosis not present

## 2013-08-27 ENCOUNTER — Encounter (HOSPITAL_COMMUNITY)
Admission: RE | Admit: 2013-08-27 | Discharge: 2013-08-27 | Disposition: A | Discharge status: Home / Self Care | Payer: Medicare Other | Source: Ambulatory Visit | Attending: General Surgery | Admitting: General Surgery

## 2013-08-27 ENCOUNTER — Encounter (HOSPITAL_COMMUNITY): Payer: Self-pay

## 2013-08-27 ENCOUNTER — Encounter (HOSPITAL_COMMUNITY): Payer: Self-pay | Admitting: Pharmacy Technician

## 2013-08-27 DIAGNOSIS — K589 Irritable bowel syndrome without diarrhea: Secondary | ICD-10-CM | POA: Diagnosis not present

## 2013-08-27 DIAGNOSIS — F3289 Other specified depressive episodes: Secondary | ICD-10-CM | POA: Diagnosis present

## 2013-08-27 DIAGNOSIS — Z87891 Personal history of nicotine dependence: Secondary | ICD-10-CM | POA: Diagnosis not present

## 2013-08-27 DIAGNOSIS — IMO0002 Reserved for concepts with insufficient information to code with codable children: Secondary | ICD-10-CM | POA: Diagnosis not present

## 2013-08-27 DIAGNOSIS — C50519 Malignant neoplasm of lower-outer quadrant of unspecified female breast: Secondary | ICD-10-CM | POA: Diagnosis not present

## 2013-08-27 DIAGNOSIS — E039 Hypothyroidism, unspecified: Secondary | ICD-10-CM | POA: Diagnosis present

## 2013-08-27 DIAGNOSIS — K219 Gastro-esophageal reflux disease without esophagitis: Secondary | ICD-10-CM | POA: Diagnosis not present

## 2013-08-27 DIAGNOSIS — Z803 Family history of malignant neoplasm of breast: Secondary | ICD-10-CM | POA: Diagnosis not present

## 2013-08-27 DIAGNOSIS — Z96659 Presence of unspecified artificial knee joint: Secondary | ICD-10-CM | POA: Diagnosis not present

## 2013-08-27 DIAGNOSIS — Z79899 Other long term (current) drug therapy: Secondary | ICD-10-CM | POA: Diagnosis not present

## 2013-08-27 DIAGNOSIS — R922 Inconclusive mammogram: Secondary | ICD-10-CM | POA: Diagnosis not present

## 2013-08-27 DIAGNOSIS — Z853 Personal history of malignant neoplasm of breast: Secondary | ICD-10-CM | POA: Diagnosis not present

## 2013-08-27 DIAGNOSIS — E785 Hyperlipidemia, unspecified: Secondary | ICD-10-CM | POA: Diagnosis not present

## 2013-08-27 DIAGNOSIS — C773 Secondary and unspecified malignant neoplasm of axilla and upper limb lymph nodes: Secondary | ICD-10-CM | POA: Diagnosis not present

## 2013-08-27 DIAGNOSIS — C50919 Malignant neoplasm of unspecified site of unspecified female breast: Secondary | ICD-10-CM | POA: Diagnosis not present

## 2013-08-27 DIAGNOSIS — F329 Major depressive disorder, single episode, unspecified: Secondary | ICD-10-CM | POA: Diagnosis present

## 2013-08-27 DIAGNOSIS — R920 Mammographic microcalcification found on diagnostic imaging of breast: Secondary | ICD-10-CM | POA: Diagnosis not present

## 2013-08-27 DIAGNOSIS — Z01812 Encounter for preprocedural laboratory examination: Secondary | ICD-10-CM | POA: Insufficient documentation

## 2013-08-27 DIAGNOSIS — IMO0001 Reserved for inherently not codable concepts without codable children: Secondary | ICD-10-CM | POA: Diagnosis not present

## 2013-08-27 DIAGNOSIS — I959 Hypotension, unspecified: Secondary | ICD-10-CM | POA: Diagnosis not present

## 2013-08-27 HISTORY — DX: Major depressive disorder, single episode, unspecified: F32.9

## 2013-08-27 HISTORY — DX: Pneumonia, unspecified organism: J18.9

## 2013-08-27 HISTORY — DX: Depression, unspecified: F32.A

## 2013-08-27 HISTORY — DX: Personal history of other diseases of the digestive system: Z87.19

## 2013-08-27 LAB — CBC
HCT: 42.2 % (ref 36.0–46.0)
MCH: 30.2 pg (ref 26.0–34.0)
MCV: 89.2 fL (ref 78.0–100.0)
Platelets: 203 10*3/uL (ref 150–400)
RDW: 13.5 % (ref 11.5–15.5)
WBC: 9 10*3/uL (ref 4.0–10.5)

## 2013-08-27 LAB — BASIC METABOLIC PANEL
BUN: 16 mg/dL (ref 6–23)
CO2: 28 mEq/L (ref 19–32)
Chloride: 99 mEq/L (ref 96–112)
Creatinine, Ser: 0.63 mg/dL (ref 0.50–1.10)
GFR calc non Af Amer: 89 mL/min — ABNORMAL LOW (ref >90)

## 2013-08-27 LAB — URINALYSIS, ROUTINE W REFLEX MICROSCOPIC
Bilirubin Urine: NEGATIVE
Glucose, UA: NEGATIVE mg/dL
Hgb urine dipstick: NEGATIVE
Ketones, ur: NEGATIVE mg/dL
Specific Gravity, Urine: 1.01 (ref 1.005–1.030)
pH: 5.5 (ref 5.0–8.0)

## 2013-08-27 LAB — HEMOGLOBIN A1C
Hgb A1c MFr Bld: 6 % — ABNORMAL HIGH (ref <5.7)
Mean Plasma Glucose: 126 mg/dL — ABNORMAL HIGH (ref <117)

## 2013-08-27 LAB — URINE MICROSCOPIC-ADD ON

## 2013-08-27 NOTE — Progress Notes (Signed)
08/27/13 0929  OBSTRUCTIVE SLEEP APNEA  Have you ever been diagnosed with sleep apnea through a sleep study? No  Do you snore loudly (loud enough to be heard through closed doors)?  1  Do you often feel tired, fatigued, or sleepy during the daytime? 1  Has anyone observed you stop breathing during your sleep? 1  Do you have, or are you being treated for high blood pressure? 0  BMI more than 35 kg/m2? 1  Age over 70 years old? 1  Neck circumference greater than 40 cm/18 inches? 0  Gender: 0  Obstructive Sleep Apnea Score 5  Score 4 or greater  Results sent to PCP

## 2013-08-27 NOTE — Progress Notes (Signed)
Pt. Is not diabetic, takes metformin for testerone .   No cardiologist. States she had a stress test 2010, negative. Doesn't remember where or what MD did the procedure and has not had any problems.

## 2013-08-27 NOTE — Pre-Procedure Instructions (Signed)
Jean Nash  08/27/2013   Your procedure is scheduled on:   Thursday October 16,2014  Report to [facility] Main Entrance "A" at 5:30 AM.  Call this number if you have problems the morning of surgery: [phone]   Remember:   Do not eat food or drink liquids after midnight.   Take these medicines the morning of surgery with A SIP OF WATER: citalopram, valium, nexium, levothyroxine, lyrica   Do not wear jewelry, make-up or nail polish.  Do not wear lotions, powders, or perfumes. You may wear deodorant.  Do not shave 48 hours prior to surgery. Men may shave face and neck.  Do not bring valuables to the hospital.  [facility] is not responsible    for any belongings or valuables.               Contacts, dentures or bridgework may not be worn into surgery.  Leave suitcase in the car. After surgery it may be brought to your room.  For patients admitted to the hospital, discharge time is determined by your                treatment team.               Patients discharged the day of surgery will not be allowed to drive  home.  Name and phone number of your driver:   Special Instructions: Shower using CHG 2 nights before surgery and the night before surgery.  If you shower the day of surgery use CHG.  Use special wash - you have one bottle of CHG for all showers.  You should use approximately 1/3 of the bottle for each shower.   Please read over the following fact sheets that you were given: Pain Booklet, Coughing and Deep Breathing and Surgical Site Infection Prevention

## 2013-08-29 LAB — URINE CULTURE: Colony Count: >=100000

## 2013-08-29 MED ORDER — CEFAZOLIN SODIUM-DEXTROSE 2-3 GM-% IV SOLR
2.0000 g | INTRAVENOUS | Status: AC
  Administered 2013-08-30: 2 g via INTRAVENOUS
  Filled 2013-08-29: qty 50

## 2013-08-29 MED ORDER — CHLORHEXIDINE GLUCONATE 4 % EX LIQD: 1.0000 "application " | Freq: Once | CUTANEOUS | Status: DC

## 2013-08-30 ENCOUNTER — Inpatient Hospital Stay (HOSPITAL_COMMUNITY)
Admission: RE | Admit: 2013-08-30 | Discharge: 2013-08-30 | Disposition: A | Discharge status: Home / Self Care | Payer: Medicare Other | Source: Ambulatory Visit | Attending: General Surgery | Admitting: General Surgery

## 2013-08-30 ENCOUNTER — Encounter (HOSPITAL_COMMUNITY): Payer: Self-pay | Admitting: *Deleted

## 2013-08-30 ENCOUNTER — Encounter (HOSPITAL_COMMUNITY)
Admission: RE | Disposition: A | Discharge status: Home / Self Care | Payer: Self-pay | Source: Ambulatory Visit | Attending: General Surgery

## 2013-08-30 ENCOUNTER — Encounter (HOSPITAL_COMMUNITY): Payer: Medicare Other | Admitting: Anesthesiology

## 2013-08-30 ENCOUNTER — Inpatient Hospital Stay (HOSPITAL_COMMUNITY): Payer: Medicare Other | Admitting: Anesthesiology

## 2013-08-30 ENCOUNTER — Inpatient Hospital Stay (HOSPITAL_COMMUNITY)
Admission: RE | Admit: 2013-08-30 | Discharge: 2013-09-01 | DRG: 580 | Disposition: A | Discharge status: Home / Self Care | Payer: Medicare Other | Source: Ambulatory Visit | Attending: General Surgery | Admitting: General Surgery

## 2013-08-30 DIAGNOSIS — F329 Major depressive disorder, single episode, unspecified: Secondary | ICD-10-CM | POA: Diagnosis present

## 2013-08-30 DIAGNOSIS — Z87891 Personal history of nicotine dependence: Secondary | ICD-10-CM

## 2013-08-30 DIAGNOSIS — F3289 Other specified depressive episodes: Secondary | ICD-10-CM | POA: Diagnosis present

## 2013-08-30 DIAGNOSIS — Z01812 Encounter for preprocedural laboratory examination: Secondary | ICD-10-CM

## 2013-08-30 DIAGNOSIS — C50519 Malignant neoplasm of lower-outer quadrant of unspecified female breast: Principal | ICD-10-CM | POA: Diagnosis present

## 2013-08-30 DIAGNOSIS — R920 Mammographic microcalcification found on diagnostic imaging of breast: Secondary | ICD-10-CM

## 2013-08-30 DIAGNOSIS — E039 Hypothyroidism, unspecified: Secondary | ICD-10-CM | POA: Diagnosis present

## 2013-08-30 DIAGNOSIS — Z853 Personal history of malignant neoplasm of breast: Secondary | ICD-10-CM

## 2013-08-30 DIAGNOSIS — IMO0002 Reserved for concepts with insufficient information to code with codable children: Secondary | ICD-10-CM | POA: Diagnosis not present

## 2013-08-30 DIAGNOSIS — C50511 Malignant neoplasm of lower-outer quadrant of right female breast: Secondary | ICD-10-CM

## 2013-08-30 DIAGNOSIS — IMO0001 Reserved for inherently not codable concepts without codable children: Secondary | ICD-10-CM | POA: Diagnosis present

## 2013-08-30 DIAGNOSIS — K219 Gastro-esophageal reflux disease without esophagitis: Secondary | ICD-10-CM | POA: Diagnosis present

## 2013-08-30 DIAGNOSIS — I959 Hypotension, unspecified: Secondary | ICD-10-CM | POA: Diagnosis not present

## 2013-08-30 DIAGNOSIS — Z96659 Presence of unspecified artificial knee joint: Secondary | ICD-10-CM

## 2013-08-30 DIAGNOSIS — Z9289 Personal history of other medical treatment: Secondary | ICD-10-CM

## 2013-08-30 DIAGNOSIS — C50919 Malignant neoplasm of unspecified site of unspecified female breast: Secondary | ICD-10-CM

## 2013-08-30 DIAGNOSIS — E785 Hyperlipidemia, unspecified: Secondary | ICD-10-CM | POA: Diagnosis present

## 2013-08-30 DIAGNOSIS — C50911 Malignant neoplasm of unspecified site of right female breast: Secondary | ICD-10-CM

## 2013-08-30 DIAGNOSIS — Y9210 Unspecified residential institution as the place of occurrence of the external cause: Secondary | ICD-10-CM | POA: Diagnosis not present

## 2013-08-30 DIAGNOSIS — Y838 Other surgical procedures as the cause of abnormal reaction of the patient, or of later complication, without mention of misadventure at the time of the procedure: Secondary | ICD-10-CM | POA: Diagnosis not present

## 2013-08-30 DIAGNOSIS — Z803 Family history of malignant neoplasm of breast: Secondary | ICD-10-CM

## 2013-08-30 DIAGNOSIS — Z79899 Other long term (current) drug therapy: Secondary | ICD-10-CM

## 2013-08-30 HISTORY — PX: BILATERAL TOTAL MASTECTOMY WITH AXILLARY LYMPH NODE DISSECTION: SHX6364

## 2013-08-30 HISTORY — DX: Personal history of other medical treatment: Z92.89

## 2013-08-30 HISTORY — PX: SIMPLE MASTECTOMY WITH AXILLARY SENTINEL NODE BIOPSY: SHX6098

## 2013-08-30 HISTORY — PX: MASTECTOMY W/ SENTINEL NODE BIOPSY: SHX2001

## 2013-08-30 LAB — CBC
Hemoglobin: 12.1 g/dL (ref 12.0–15.0)
MCH: 30.5 pg (ref 26.0–34.0)
MCHC: 34.3 g/dL (ref 30.0–36.0)
MCV: 88.9 fL (ref 78.0–100.0)
Platelets: 168 10*3/uL (ref 150–400)
RBC: 3.97 MIL/uL (ref 3.87–5.11)
WBC: 16.8 10*3/uL — ABNORMAL HIGH (ref 4.0–10.5)

## 2013-08-30 LAB — ABO/RH: ABO/RH(D): A POS

## 2013-08-30 LAB — HEMOGLOBIN AND HEMATOCRIT, BLOOD
HCT: 27.9 % — ABNORMAL LOW (ref 36.0–46.0)
Hemoglobin: 9.4 g/dL — ABNORMAL LOW (ref 12.0–15.0)

## 2013-08-30 LAB — CREATININE, SERUM
Creatinine, Ser: 0.58 mg/dL (ref 0.50–1.10)
GFR calc non Af Amer: >90 mL/min (ref >90)

## 2013-08-30 SURGERY — SIMPLE MASTECTOMY
Anesthesia: General | Site: Breast | Laterality: Right | Wound class: Clean

## 2013-08-30 MED ORDER — MIDAZOLAM HCL 2 MG/2ML IJ SOLN: 1.0000 mg | INTRAMUSCULAR | Status: DC | PRN

## 2013-08-30 MED ORDER — OXYCODONE-ACETAMINOPHEN 5-325 MG PO TABS
1.0000 | ORAL_TABLET | ORAL | Status: DC | PRN
  Administered 2013-08-31: 2 via ORAL
  Filled 2013-08-30: qty 2

## 2013-08-30 MED ORDER — FENTANYL CITRATE 0.05 MG/ML IJ SOLN: 50.0000 ug | Freq: Once | INTRAMUSCULAR | Status: DC

## 2013-08-30 MED ORDER — ONDANSETRON HCL 4 MG/2ML IJ SOLN: 4.0000 mg | Freq: Four times a day (QID) | INTRAMUSCULAR | Status: DC | PRN

## 2013-08-30 MED ORDER — PREGABALIN 75 MG PO CAPS
75.0000 mg | ORAL_CAPSULE | Freq: Two times a day (BID) | ORAL | Status: DC
  Administered 2013-08-30 – 2013-09-01 (×4): 75 mg via ORAL
  Filled 2013-08-30 (×4): qty 1

## 2013-08-30 MED ORDER — HYDROMORPHONE HCL PF 1 MG/ML IJ SOLN: 0.2500 mg | INTRAMUSCULAR | Status: DC | PRN

## 2013-08-30 MED ORDER — DICLOFENAC SODIUM 1 % TD GEL
1.0000 "application " | TRANSDERMAL | Status: DC | PRN
  Filled 2013-08-30: qty 100

## 2013-08-30 MED ORDER — CEFAZOLIN SODIUM 1-5 GM-% IV SOLN
1.0000 g | Freq: Four times a day (QID) | INTRAVENOUS | Status: AC
  Administered 2013-08-30 (×2): 1 g via INTRAVENOUS
  Filled 2013-08-30 (×4): qty 50

## 2013-08-30 MED ORDER — NEOSTIGMINE METHYLSULFATE 1 MG/ML IJ SOLN
INTRAMUSCULAR | Status: DC | PRN
  Administered 2013-08-30: 3 mg via INTRAVENOUS

## 2013-08-30 MED ORDER — DIAZEPAM 5 MG PO TABS: 5.0000 mg | ORAL_TABLET | Freq: Four times a day (QID) | ORAL | Status: DC | PRN

## 2013-08-30 MED ORDER — SODIUM CHLORIDE 0.9 % IV SOLN
INTRAVENOUS | Status: DC | PRN
  Administered 2013-08-30: 250 mL via INTRAMUSCULAR

## 2013-08-30 MED ORDER — ZOLPIDEM TARTRATE 5 MG PO TABS: 5.0000 mg | ORAL_TABLET | Freq: Every evening | ORAL | Status: DC | PRN

## 2013-08-30 MED ORDER — DEXAMETHASONE SODIUM PHOSPHATE 4 MG/ML IJ SOLN
INTRAMUSCULAR | Status: DC | PRN
  Administered 2013-08-30: 4 mg via INTRAVENOUS

## 2013-08-30 MED ORDER — SODIUM CHLORIDE 0.9 % IV BOLUS (SEPSIS)
500.0000 mL | Freq: Once | INTRAVENOUS | Status: AC
  Administered 2013-08-30: 500 mL via INTRAVENOUS

## 2013-08-30 MED ORDER — AMOXICILLIN 500 MG PO CAPS
500.0000 mg | ORAL_CAPSULE | Freq: Three times a day (TID) | ORAL | Status: DC
  Administered 2013-08-30 – 2013-09-01 (×6): 500 mg via ORAL
  Filled 2013-08-30 (×9): qty 1

## 2013-08-30 MED ORDER — OXYCODONE HCL 5 MG/5ML PO SOLN: 5.0000 mg | Freq: Once | ORAL | Status: DC | PRN

## 2013-08-30 MED ORDER — CITALOPRAM HYDROBROMIDE 10 MG PO TABS
10.0000 mg | ORAL_TABLET | Freq: Every day | ORAL | Status: DC
  Administered 2013-08-31 – 2013-09-01 (×2): 10 mg via ORAL
  Filled 2013-08-30 (×2): qty 1

## 2013-08-30 MED ORDER — KETOTIFEN FUMARATE 0.025 % OP SOLN
1.0000 [drp] | Freq: Two times a day (BID) | OPHTHALMIC | Status: DC
  Administered 2013-08-30 – 2013-09-01 (×4): 1 [drp] via OPHTHALMIC
  Filled 2013-08-30: qty 5

## 2013-08-30 MED ORDER — DOCUSATE SODIUM 100 MG PO CAPS
100.0000 mg | ORAL_CAPSULE | Freq: Two times a day (BID) | ORAL | Status: DC
  Administered 2013-08-30 – 2013-09-01 (×5): 100 mg via ORAL
  Filled 2013-08-30 (×5): qty 1

## 2013-08-30 MED ORDER — ENOXAPARIN SODIUM 40 MG/0.4ML ~~LOC~~ SOLN
40.0000 mg | SUBCUTANEOUS | Status: DC
  Filled 2013-08-30: qty 0.4

## 2013-08-30 MED ORDER — LORATADINE 10 MG PO TABS
10.0000 mg | ORAL_TABLET | Freq: Every day | ORAL | Status: DC
  Administered 2013-08-30 – 2013-09-01 (×3): 10 mg via ORAL
  Filled 2013-08-30 (×3): qty 1

## 2013-08-30 MED ORDER — BUPIVACAINE-EPINEPHRINE PF 0.5-1:200000 % IJ SOLN
INTRAMUSCULAR | Status: AC
  Filled 2013-08-30: qty 120

## 2013-08-30 MED ORDER — BUPIVACAINE-EPINEPHRINE 0.5% -1:200000 IJ SOLN
INTRAMUSCULAR | Status: DC | PRN
  Administered 2013-08-30: 120 mL

## 2013-08-30 MED ORDER — FENTANYL CITRATE 0.05 MG/ML IJ SOLN
INTRAMUSCULAR | Status: DC | PRN
  Administered 2013-08-30 (×2): 50 ug via INTRAVENOUS
  Administered 2013-08-30: 100 ug via INTRAVENOUS
  Administered 2013-08-30 (×2): 50 ug via INTRAVENOUS

## 2013-08-30 MED ORDER — SODIUM CHLORIDE 0.9 % IJ SOLN
INTRAMUSCULAR | Status: AC
  Filled 2013-08-30: qty 10

## 2013-08-30 MED ORDER — MELOXICAM 7.5 MG PO TABS
7.5000 mg | ORAL_TABLET | Freq: Every day | ORAL | Status: DC
  Administered 2013-08-30 – 2013-08-31 (×2): 7.5 mg via ORAL
  Filled 2013-08-30 (×3): qty 1

## 2013-08-30 MED ORDER — KCL IN DEXTROSE-NACL 20-5-0.45 MEQ/L-%-% IV SOLN
INTRAVENOUS | Status: DC
  Administered 2013-08-30 – 2013-08-31 (×4): via INTRAVENOUS
  Filled 2013-08-30 (×8): qty 1000

## 2013-08-30 MED ORDER — MIDAZOLAM HCL 5 MG/5ML IJ SOLN
INTRAMUSCULAR | Status: DC | PRN
  Administered 2013-08-30: 2 mg via INTRAVENOUS

## 2013-08-30 MED ORDER — MIRABEGRON ER 25 MG PO TB24
25.0000 mg | ORAL_TABLET | Freq: Every day | ORAL | Status: DC
  Administered 2013-08-30 – 2013-09-01 (×3): 25 mg via ORAL
  Filled 2013-08-30 (×3): qty 1

## 2013-08-30 MED ORDER — PROPOFOL 10 MG/ML IV BOLUS
INTRAVENOUS | Status: DC | PRN
  Administered 2013-08-30: 150 mg via INTRAVENOUS

## 2013-08-30 MED ORDER — OXYCODONE HCL 5 MG PO TABS: 5.0000 mg | ORAL_TABLET | Freq: Once | ORAL | Status: DC | PRN

## 2013-08-30 MED ORDER — SODIUM CHLORIDE 0.9 % IJ SOLN
INTRAMUSCULAR | Status: DC | PRN
  Administered 2013-08-30: 10 mL via INTRAVENOUS

## 2013-08-30 MED ORDER — PANTOPRAZOLE SODIUM 40 MG PO TBEC
40.0000 mg | DELAYED_RELEASE_TABLET | Freq: Every day | ORAL | Status: DC
  Administered 2013-08-31 – 2013-09-01 (×2): 40 mg via ORAL
  Filled 2013-08-30 (×2): qty 1

## 2013-08-30 MED ORDER — LEVOTHYROXINE SODIUM 50 MCG PO TABS
50.0000 ug | ORAL_TABLET | Freq: Every day | ORAL | Status: DC
  Administered 2013-08-31 – 2013-09-01 (×2): 50 ug via ORAL
  Filled 2013-08-30 (×3): qty 1

## 2013-08-30 MED ORDER — MORPHINE SULFATE 2 MG/ML IJ SOLN
1.0000 mg | INTRAMUSCULAR | Status: DC | PRN
  Administered 2013-08-30 (×2): 4 mg via INTRAVENOUS
  Administered 2013-08-31: 2 mg via INTRAVENOUS
  Administered 2013-08-31: 4 mg via INTRAVENOUS
  Administered 2013-08-31 – 2013-09-01 (×3): 2 mg via INTRAVENOUS
  Filled 2013-08-30: qty 2
  Filled 2013-08-30: qty 1
  Filled 2013-08-30: qty 2
  Filled 2013-08-30 (×3): qty 1
  Filled 2013-08-30: qty 2

## 2013-08-30 MED ORDER — ONDANSETRON HCL 4 MG/2ML IJ SOLN
INTRAMUSCULAR | Status: DC | PRN
  Administered 2013-08-30: 4 mg via INTRAMUSCULAR

## 2013-08-30 MED ORDER — TEMAZEPAM 7.5 MG PO CAPS: 7.5000 mg | ORAL_CAPSULE | Freq: Every evening | ORAL | Status: DC | PRN

## 2013-08-30 MED ORDER — ALBUMIN HUMAN 5 % IV SOLN
25.0000 g | Freq: Once | INTRAVENOUS | Status: AC
  Administered 2013-08-30: 25 g via INTRAVENOUS
  Filled 2013-08-30: qty 500

## 2013-08-30 MED ORDER — METHYLENE BLUE 1 % INJ SOLN
INTRAMUSCULAR | Status: AC
  Filled 2013-08-30: qty 10

## 2013-08-30 MED ORDER — SODIUM CHLORIDE 0.9 % IV BOLUS (SEPSIS)
250.0000 mL | Freq: Once | INTRAVENOUS | Status: AC
  Administered 2013-08-30: 250 mL via INTRAVENOUS

## 2013-08-30 MED ORDER — ONDANSETRON HCL 4 MG PO TABS: 4.0000 mg | ORAL_TABLET | Freq: Four times a day (QID) | ORAL | Status: DC | PRN

## 2013-08-30 MED ORDER — METFORMIN HCL 500 MG PO TABS
1000.0000 mg | ORAL_TABLET | Freq: Two times a day (BID) | ORAL | Status: DC
  Administered 2013-08-30 – 2013-09-01 (×4): 1000 mg via ORAL
  Filled 2013-08-30 (×6): qty 2

## 2013-08-30 MED ORDER — LACTATED RINGERS IV SOLN
INTRAVENOUS | Status: DC | PRN
  Administered 2013-08-30 (×3): via INTRAVENOUS

## 2013-08-30 MED ORDER — ARTIFICIAL TEARS OP OINT
TOPICAL_OINTMENT | OPHTHALMIC | Status: DC | PRN
  Administered 2013-08-30: 1 via OPHTHALMIC

## 2013-08-30 MED ORDER — ROCURONIUM BROMIDE 100 MG/10ML IV SOLN
INTRAVENOUS | Status: DC | PRN
  Administered 2013-08-30 (×3): 10 mg via INTRAVENOUS
  Administered 2013-08-30: 40 mg via INTRAVENOUS

## 2013-08-30 MED ORDER — BUPIVACAINE-EPINEPHRINE (PF) 0.5% -1:200000 IJ SOLN
INTRAMUSCULAR | Status: AC
  Filled 2013-08-30: qty 40

## 2013-08-30 MED ORDER — TECHNETIUM TC 99M SULFUR COLLOID FILTERED
1.0000 | Freq: Once | INTRAVENOUS | Status: AC | PRN
  Administered 2013-08-30: 1 via INTRADERMAL

## 2013-08-30 MED ORDER — LIDOCAINE HCL (CARDIAC) 20 MG/ML IV SOLN
INTRAVENOUS | Status: DC | PRN
  Administered 2013-08-30: 80 mg via INTRAVENOUS

## 2013-08-30 MED ORDER — PROMETHAZINE HCL 25 MG/ML IJ SOLN: 6.2500 mg | INTRAMUSCULAR | Status: DC | PRN

## 2013-08-30 MED ORDER — GLYCOPYRROLATE 0.2 MG/ML IJ SOLN
INTRAMUSCULAR | Status: DC | PRN
  Administered 2013-08-30: 0.4 mg via INTRAVENOUS

## 2013-08-30 MED ORDER — PHENYLEPHRINE HCL 10 MG/ML IJ SOLN
INTRAMUSCULAR | Status: DC | PRN
  Administered 2013-08-30 (×3): 40 ug via INTRAVENOUS

## 2013-08-30 MED ORDER — SIMVASTATIN 10 MG PO TABS
10.0000 mg | ORAL_TABLET | Freq: Every day | ORAL | Status: DC
  Administered 2013-08-30 – 2013-08-31 (×2): 10 mg via ORAL
  Filled 2013-08-30 (×3): qty 1

## 2013-08-30 MED ORDER — SODIUM CHLORIDE 0.9 % IJ SOLN
INTRAMUSCULAR | Status: DC | PRN
  Administered 2013-08-30: 09:00:00 via INTRAMUSCULAR

## 2013-08-30 MED ORDER — EPHEDRINE SULFATE 50 MG/ML IJ SOLN
INTRAMUSCULAR | Status: DC | PRN
  Administered 2013-08-30 (×2): 10 mg via INTRAVENOUS
  Administered 2013-08-30: 5 mg via INTRAVENOUS
  Administered 2013-08-30: 15 mg via INTRAVENOUS
  Administered 2013-08-30: 10 mg via INTRAVENOUS

## 2013-08-30 SURGICAL SUPPLY — 75 items
ADH SKN CLS APL DERMABOND .7 (GAUZE/BANDAGES/DRESSINGS)
APPLIER CLIP 9.375 MED OPEN (MISCELLANEOUS)
APR CLP MED 9.3 20 MLT OPN (MISCELLANEOUS)
BINDER BREAST LRG (GAUZE/BANDAGES/DRESSINGS) IMPLANT
BINDER BREAST XLRG (GAUZE/BANDAGES/DRESSINGS) ×1 IMPLANT
BIOPATCH RED 1 DISK 7.0 (GAUZE/BANDAGES/DRESSINGS) ×3 IMPLANT
BLADE SURG 10 STRL SS (BLADE) ×3 IMPLANT
BLADE SURG 15 STRL LF DISP TIS (BLADE) ×2 IMPLANT
BLADE SURG 15 STRL SS (BLADE) ×3
BNDG COHESIVE 4X5 TAN STRL (GAUZE/BANDAGES/DRESSINGS) ×3 IMPLANT
CANISTER SUCTION 2500CC (MISCELLANEOUS) ×3 IMPLANT
CHLORAPREP W/TINT 26ML (MISCELLANEOUS) ×4 IMPLANT
CLIP APPLIE 9.375 MED OPEN (MISCELLANEOUS) IMPLANT
CLIP TI MEDIUM 24 (CLIP) ×2 IMPLANT
CLIP TI MEDIUM 6 (CLIP) ×3 IMPLANT
CLIP TI WIDE RED SMALL 24 (CLIP) ×1 IMPLANT
CLIP TI WIDE RED SMALL 6 (CLIP) ×3 IMPLANT
CONT SPEC 4OZ CLIKSEAL STRL BL (MISCELLANEOUS) ×7 IMPLANT
COVER MAYO STAND STRL (DRAPES) ×1 IMPLANT
COVER SURGICAL LIGHT HANDLE (MISCELLANEOUS) ×3 IMPLANT
COVER TRANSDUCER ULTRASND GEL (DRAPE) ×3 IMPLANT
DERMABOND ADVANCED (GAUZE/BANDAGES/DRESSINGS)
DERMABOND ADVANCED .7 DNX12 (GAUZE/BANDAGES/DRESSINGS) IMPLANT
DRAIN CHANNEL 19F RND (DRAIN) ×5 IMPLANT
DRAPE UTILITY 15X26 W/TAPE STR (DRAPE) ×6 IMPLANT
DRSG PAD ABDOMINAL 8X10 ST (GAUZE/BANDAGES/DRESSINGS) ×4 IMPLANT
ELECT CAUTERY BLADE 6.4 (BLADE) ×3 IMPLANT
ELECT REM PT RETURN 9FT ADLT (ELECTROSURGICAL) ×3
ELECTRODE REM PT RTRN 9FT ADLT (ELECTROSURGICAL) ×2 IMPLANT
EVACUATOR SILICONE 100CC (DRAIN) ×5 IMPLANT
GLOVE BIO SURGEON STRL SZ 6 (GLOVE) ×3 IMPLANT
GLOVE BIO SURGEON STRL SZ7.5 (GLOVE) ×1 IMPLANT
GLOVE BIOGEL PI IND STRL 6.5 (GLOVE) ×2 IMPLANT
GLOVE BIOGEL PI IND STRL 7.0 (GLOVE) IMPLANT
GLOVE BIOGEL PI IND STRL 7.5 (GLOVE) IMPLANT
GLOVE BIOGEL PI INDICATOR 6.5 (GLOVE) ×5
GLOVE BIOGEL PI INDICATOR 7.0 (GLOVE) ×2
GLOVE BIOGEL PI INDICATOR 7.5 (GLOVE) ×3
GLOVE ECLIPSE 6.5 STRL STRAW (GLOVE) ×3 IMPLANT
GOWN PREVENTION PLUS XXLARGE (GOWN DISPOSABLE) ×5 IMPLANT
GOWN STRL NON-REIN LRG LVL3 (GOWN DISPOSABLE) ×8 IMPLANT
GOWN STRL REIN XL XLG (GOWN DISPOSABLE) ×2 IMPLANT
KIT BASIN OR (CUSTOM PROCEDURE TRAY) ×3 IMPLANT
KIT ROOM TURNOVER OR (KITS) ×3 IMPLANT
MARKER SKIN DUAL TIP RULER LAB (MISCELLANEOUS) ×3 IMPLANT
NDL 18GX1X1/2 (RX/OR ONLY) (NEEDLE) ×2 IMPLANT
NDL HYPO 25GX1X1/2 BEV (NEEDLE) ×2 IMPLANT
NDL SPNL 22GX3.5 QUINCKE BK (NEEDLE) ×2 IMPLANT
NEEDLE 18GX1X1/2 (RX/OR ONLY) (NEEDLE) ×3 IMPLANT
NEEDLE HYPO 25GX1X1/2 BEV (NEEDLE) ×3 IMPLANT
NEEDLE SPNL 22GX3.5 QUINCKE BK (NEEDLE) ×3 IMPLANT
NS IRRIG 1000ML POUR BTL (IV SOLUTION) ×4 IMPLANT
PACK GENERAL/GYN (CUSTOM PROCEDURE TRAY) ×3 IMPLANT
PACK UNIVERSAL I (CUSTOM PROCEDURE TRAY) ×3 IMPLANT
PAD ARMBOARD 7.5X6 YLW CONV (MISCELLANEOUS) ×6 IMPLANT
PIN SAFETY STERILE (MISCELLANEOUS) ×2 IMPLANT
SPECIMEN JAR X LARGE (MISCELLANEOUS) ×4 IMPLANT
SPONGE GAUZE 4X4 12PLY (GAUZE/BANDAGES/DRESSINGS) ×4 IMPLANT
SPONGE LAP 18X18 X RAY DECT (DISPOSABLE) ×1 IMPLANT
STAPLER VISISTAT 35W (STAPLE) ×3 IMPLANT
STOCKINETTE IMPERVIOUS 9X36 MD (GAUZE/BANDAGES/DRESSINGS) ×3 IMPLANT
STOCKINETTE IMPERVIOUS LG (DRAPES) ×1 IMPLANT
STRIP CLOSURE SKIN 1/2X4 (GAUZE/BANDAGES/DRESSINGS) ×11 IMPLANT
SUT ETHILON 2 0 FS 18 (SUTURE) ×2 IMPLANT
SUT MNCRL AB 4-0 PS2 18 (SUTURE) ×3 IMPLANT
SUT MON AB 4-0 PC3 18 (SUTURE) ×3 IMPLANT
SUT SILK 2 0 (SUTURE) ×3
SUT SILK 2 0 FS (SUTURE) ×4 IMPLANT
SUT SILK 2-0 18XBRD TIE 12 (SUTURE) ×2 IMPLANT
SUT VIC AB 3-0 SH 8-18 (SUTURE) ×9 IMPLANT
SYR 50ML SLIP (SYRINGE) ×3 IMPLANT
SYR CONTROL 10ML LL (SYRINGE) ×3 IMPLANT
TOWEL OR 17X24 6PK STRL BLUE (TOWEL DISPOSABLE) ×3 IMPLANT
TOWEL OR 17X26 10 PK STRL BLUE (TOWEL DISPOSABLE) ×3 IMPLANT
TUBE CONNECTING 12X1/4 (SUCTIONS) ×1 IMPLANT

## 2013-08-30 NOTE — Transfer of Care (Signed)
Immediate Anesthesia Transfer of Care Note  Patient: Jean Nash  Procedure(s) Performed: Procedure(s) with comments: Bilateral Breast Mastectomy  (Left) RIGHT  AXILLARY SENTINEL LYMPH NODE BIOPSY; Right Axillary Node Disection (Right) - Right side nuc med 7:00   Patient Location: PACU  Anesthesia Type:General  Level of Consciousness: awake, alert  and oriented  Airway & Oxygen Therapy: Patient Spontanous Breathing and Patient connected to nasal cannula oxygen  Post-op Assessment: Report given to PACU RN, Post -op Vital signs reviewed and stable and Patient moving all extremities  Post vital signs: Reviewed and stable  Complications: No apparent anesthesia complications

## 2013-08-30 NOTE — OR Nursing (Signed)
Closure of left breast @0954 .

## 2013-08-30 NOTE — Anesthesia Procedure Notes (Signed)
Procedure Name: Intubation Date/Time: 08/30/2013 7:43 AM Performed by: Orvilla Fus A Pre-anesthesia Checklist: Patient identified, Timeout performed, Suction available, Emergency Drugs available and Patient being monitored Patient Re-evaluated:Patient Re-evaluated prior to inductionOxygen Delivery Method: Circle system utilized Preoxygenation: Pre-oxygenation with 100% oxygen Intubation Type: IV induction Ventilation: Mask ventilation without difficulty and Oral airway inserted - appropriate to patient size Laryngoscope Size: Mac and 3 Grade View: Grade I Tube type: Oral Tube size: 7.0 mm Number of attempts: 1 Airway Equipment and Method: Stylet Placement Confirmation: ETT inserted through vocal cords under direct vision,  breath sounds checked- equal and bilateral and positive ETCO2 Secured at: 21 cm Tube secured with: Tape Dental Injury: Teeth and Oropharynx as per pre-operative assessment

## 2013-08-30 NOTE — Anesthesia Preprocedure Evaluation (Addendum)
Anesthesia Evaluation  Patient identified by MRN, date of birth, ID band Patient awake    Reviewed: Allergy & Precautions, H&P , NPO status , Patient's Chart, lab work & pertinent test results  Airway Mallampati: II TM Distance: <3 FB Neck ROM: full    Dental  (+) Teeth Intact and Dental Advisory Given   Pulmonary asthma , former smoker,  breath sounds clear to auscultation        Cardiovascular Rhythm:Regular Rate:Normal     Neuro/Psych  Headaches, Depression  Neuromuscular disease    GI/Hepatic hiatal hernia, GERD-  ,  Endo/Other  Hypothyroidism Morbid obesity  Renal/GU      Musculoskeletal  (+) Fibromyalgia -  Abdominal (+) + obese,   Peds  Hematology   Anesthesia Other Findings   Reproductive/Obstetrics                          Anesthesia Physical Anesthesia Plan  ASA: III  Anesthesia Plan: General   Post-op Pain Management:    Induction: Intravenous  Airway Management Planned: Oral ETT  Additional Equipment:   Intra-op Plan:   Post-operative Plan: Extubation in OR  Informed Consent: I have reviewed the patients History and Physical, chart, labs and discussed the procedure including the risks, benefits and alternatives for the proposed anesthesia with the patient or authorized representative who has indicated his/her understanding and acceptance.     Plan Discussed with: CRNA and Surgeon  Anesthesia Plan Comments:         Anesthesia Quick Evaluation

## 2013-08-30 NOTE — Progress Notes (Signed)
Patient has gotten progressively more hypotensive throughout the evening after her bilateral mastectomies.  Repeat H/H was ordered 1.5 hours ago and her blood has still not been drawn.  This is completely unacceptable.   She has a hematoma underneath her breast flap on the left.  It is not tense and is not tender.  I suspect this is the site of her blood loss.  Will call the lab again and try to get the blood drawn stat. In the meantime, will type and screen the patient and give albumin bolus.  Marta Lamas. Gae Bon, MD, FACS [phone] [facility]

## 2013-08-30 NOTE — H&P (View-Only) (Signed)
Chief complaint:  Right breast cancer  HISTORY: Patient is a 72 year old female with a very extensive family history for breast cancer. She had a mother who had breast cancer at age 37, a sister with breast cancer at age 15 and a maternal grandmother with breast cancer at age 36. She also had 2 paternal aunts and a paternal grandmother who had breast cancer in their 74s and 64s.  Also her maternal grandmother had a form of intra-abdominal cancer. She has a maternal uncle and cousin with brain tumors. She actually had a normal screening MRI. However she got a letter in the mail stating that she had heterogeneously dense breasts and might benefit from an MRI. She stopped the MRI and was able to obtain an appointment. She was found to have an 8.4 cm area of abnormality.  MR biopsy demonstrated invasive mammary carcinoma with ER 90%, PR 90%, HER-2 not overexpressed, and Ki67 5%.    She had menarche at age 62. She no longer had periods after the age of 26 when she had a hysterectomy. She had 3 children with her first at age 89. She did use birth control pills for 2-3 years. She has not ever used hormone replacement. She is up-to-date with her colonoscopy and her bone density. She is a former smoker. She does not drink alcohol or use illicit drugs.  Past Medical History  Diagnosis Date  . Chest pain, atypical     Negative stress test 02/2009  . Other and unspecified hyperlipidemia   . Personal history of urinary calculi   . Pain in joint, shoulder region   . Irritable bowel syndrome   . Chronic insomnia   . GERD (gastroesophageal reflux disease)   . History of colonic polyps   . Fibromyalgia   . Hypothyroidism   . Other abnormal glucose   . Stress incontinence, female   . Macular degeneration   . Cluster headaches     history of migraines  . Meniscus tear     history of right knee torn meniscus  . Asthma     hx of -no inhalers  . Arthritis   . Kidney stone   . Cataract   . Breast cancer      Past Surgical History  Procedure Laterality Date  . Appendectomy    . Cholecystectomy    . Abdominal hysterectomy    . Tonsillectomy    . Eye surgery      to repair macular hole  . Cataract extraction, bilateral    . Knee arthroscopy      > 10 years ago  . Joint replacement  03/15/11    left knee replacement  . Colonoscopy    . Ganglion cyst excision      rt foot  . Foot arthroplasty      lt   . Mass excision  11/04/2011    Procedure: EXCISION MASS;  Surgeon: Wyn Forster., MD;  Location: [facility];  Service: Orthopedics;  Laterality: Right;  excisional biopsy right ulna mass  . Hemorrhoid surgery      03/1993  . Breast cyst aspiration      9 cysts  . Toe surgery      preventative crossover toe surg/right foot  . Toe surgery      left foot/screw  in 2nd toe  . Upper gastrointestinal endoscopy    . Skin tags removed      breast, panty line, neckline    Current Outpatient Prescriptions  Medication Sig Dispense Refill  . albuterol (PROVENTIL HFA;VENTOLIN HFA) 108 (90 BASE) MCG/ACT inhaler Inhale 2 puffs into the lungs every 6 (six) hours as needed for wheezing.  3 Inhaler  3  . amoxicillin (AMOXIL) 500 MG capsule Take 4 capsules 1 hour before dental procedures  4 capsule  1  . butalbital-acetaminophen-caffeine (FIORICET WITH CODEINE) 50-325-40-30 MG per capsule Take 1 capsule by mouth every 4 (four) hours as needed.  30 capsule  1  . calcium carbonate (OS-CAL) 600 MG TABS Take 600 mg by mouth 2 (two) times daily with a meal.        . cetirizine (ZYRTEC) 10 MG tablet Take 10 mg by mouth daily.      . citalopram (CELEXA) 20 MG tablet Take 0.5 tablets (10 mg total) by mouth daily.  90 tablet  1  . diazepam (VALIUM) 5 MG tablet Take 1 tablet (5 mg total) by mouth every 6 (six) hours as needed.  30 tablet  3  . diclofenac sodium (VOLTAREN) 1 % GEL Apply 1 application topically as needed.      . docusate sodium (STOOL SOFTENER) 100 MG capsule Take 100 mg  by mouth 3 (three) times daily as needed.       Marland Kitchen esomeprazole (NEXIUM) 40 MG capsule Take 1 capsule (40 mg total) by mouth daily.  90 capsule  1  . HYDROcodone-acetaminophen (LORTAB) 7.5-500 MG per tablet 1/2 to one tablet every 8 hrs as needed  30 tablet  0  . levothyroxine (SYNTHROID, LEVOTHROID) 50 MCG tablet Take 1 tablet (50 mcg total) by mouth daily.  90 tablet  3  . metFORMIN (GLUCOPHAGE) 1000 MG tablet Take 1 tablet (1,000 mg total) by mouth 2 (two) times daily with a meal.  180 tablet  3  . mirabegron ER (MYRBETRIQ) 25 MG TB24 tablet Take 1 tablet (25 mg total) by mouth daily.  90 tablet  3  . pregabalin (LYRICA) 75 MG capsule Take 1 capsule (75 mg total) by mouth 2 (two) times daily.  180 capsule  1  . Probiotic Product (ACIDOPHILUS PROBIOTIC BLEND) CAPS Take by mouth daily.        . simvastatin (ZOCOR) 10 MG tablet Take 1 tablet (10 mg total) by mouth at bedtime.  90 tablet  1  . temazepam (RESTORIL) 7.5 MG capsule Take 1 capsule (7.5 mg total) by mouth at bedtime as needed.  90 capsule  1   No current facility-administered medications for this visit.     No Known Allergies   Family History  Problem Relation Age of Onset  . Stroke Mother     died age 69  . Diabetes Mother   . Breast cancer Mother   . Breast cancer Sister   . Breast cancer Paternal Aunt   . Diabetes Maternal Grandfather   . Breast cancer Paternal Grandmother   . Breast cancer Paternal Aunt      History   Social History  . Marital Status: Married    Spouse Name: N/A    Number of Children: N/A  . Years of Education: N/A   Social History Main Topics  . Smoking status: Former Games developer  . Smokeless tobacco: Never Used  . Alcohol Use: No  . Drug Use: No  . Sexual Activity: Not on file   Other Topics Concern  . Not on file   Social History Narrative   Occupation:  Retired Catering manager    Married with 3 grown children  Never Smoked     Alcohol use-no          REVIEW OF SYSTEMS - PERTINENT  POSITIVES ONLY: 12 point review of systems negative other than HPI and PMH except for insomnia, foot/breast pain, dental issues, shortness of breath with stairs, heartburn, thyroid problems.    EXAM: Wt Readings from Last 3 Encounters:  08/15/13 187 lb 9.6 oz (85.095 kg)  06/22/13 183 lb (83.008 kg)  02/21/13 183 lb (83.008 kg)   Temp Readings from Last 3 Encounters:  08/15/13 97.9 F (36.6 C) Oral  06/22/13 98 F (36.7 C) Oral  02/21/13 97.9 F (36.6 C) Oral   BP Readings from Last 3 Encounters:  08/15/13 121/77  06/22/13 116/74  02/21/13 134/80   Pulse Readings from Last 3 Encounters:  08/15/13 71  06/22/13 72  02/21/13 68    Gen:  No acute distress.  Well nourished and well groomed.   Neurological: Alert and oriented to person, place, and time. Coordination normal.  Head: Normocephalic and atraumatic.  Eyes: Conjunctivae are normal. Pupils are equal, round, and reactive to light. No scleral icterus.  Neck: Normal range of motion. Neck supple. No tracheal deviation or thyromegaly present.  Cardiovascular: Normal rate, regular rhythm, normal heart sounds and intact distal pulses.  Exam reveals no gallop and no friction rub.  No murmur heard. Respiratory: Effort normal.  No respiratory distress. No chest wall tenderness. Breath sounds normal.  No wheezes, rales or rhonchi.  Breast:  Breasts are ptotic and symmetric bilaterally.  There is moderately dense breast tissue.  There are no palpable masses, skin dimpling, nipple retraction or discharge, no palpable adenopathy.  There are 2 transversely oriented scars in the upper outer quadrant of the breast.   GI: Soft. Bowel sounds are normal. The abdomen is soft and nontender.  There is no rebound and no guarding. Midline abdominal incision.   Musculoskeletal: Normal range of motion. Extremities are nontender.  Lymphadenopathy: No cervical, preauricular, postauricular or axillary adenopathy is present Skin: Skin is warm and dry. No  rash noted. No diaphoresis. No erythema. No pallor. No clubbing, cyanosis, or edema.   Psychiatric: Normal mood and affect. Behavior is normal. Judgment and thought content normal.    LABORATORY RESULTS: Available labs are reviewed  Pathology Diagnosis Breast, right, needle core biopsy, LOQ - INVASIVE AND IN SITU MAMMARY CARCINOMA. - SEE COMMENT. Microscopic Comment The carcinoma is grade 1 and is favored to be a ductal phenotype. A breast prognostic profile will be performed and the results reported separately. The results were called to the [facility] on 08/09/13. (JBK:kh 08/09/13) CBC with sl elevated WBCs at 12.5.  Otherwise, CBC, CMET essentially normal.     RADIOLOGY RESULTS: See E-Chart or I-Site for most recent results.  Images and reports are reviewed. Mammogram IMPRESSION:  No mammographic evidence of malignancy.  A result letter of this screening mammogram will be mailed directly  to the patient.  MRI IMPRESSION:  Suspicious enhancement in the lower outer quadrant of the right  breast.  RECOMMENDATION:  MR guided core biopsy of the right breast is recommended. This  will be scheduled at the patient's convenience.    ASSESSMENT AND PLAN: Breast cancer of lower-outer quadrant of right female breast Patient is a 72 year old female with a new diagnosis of right breast cancer. This is a linear area, but she is not interested in pursuing breast conservation. She has such a strong history of breast cancer in her family, that she  desires bilateral mastectomy. She is of Ashkenazi Jewish descent.  She will need to be referred to genetics. She also will need an Oncotype.    She does not read desire reconstruction. She has been offered referral to a Engineer, petroleum to discuss and declines.  She saw physical therapy to assess her mobility preoperatively.  We'll plan to do bilateral mastectomies with right-sided sentinel lymph node biopsy.  I discussed the  risks with the patient including bleeding, infection, seroma, nerve damage, pain, blood clots, other.  I reviewed the course of surgery including the sentinel lymph node injection preoperatively. I reviewed the location of the incisions and what the surgery would entail.  I reviewed the risk of breast cancer recurrence.  She understands and wishes to proceed.  45 min spent in evaluation, examination, counseling, and coordination of care.  >50% spent in counseling.       Maudry Diego MD Surgical Oncology, General and Endocrine Surgery [facility]      Visit Diagnoses: 1. Breast cancer, right   2. Breast cancer of lower-outer quadrant of right female breast     Primary Care Physician: Thomos Lemons, DO   [place], [address] [place], [place].

## 2013-08-30 NOTE — Op Note (Signed)
Bilateral Mastectomy with Right Sentinel Node Biopsy followed by ALND Procedure Note  Indications: This patient presents with history of right breast cancer with clinically negative axillary lymph node exam.  Pre-operative Diagnosis: right breast cancer, [id number]   Post-operative Diagnosis: right breast cancer, same  Surgeon: Almond Lint   Anesthesia: General endotracheal anesthesia and Local anesthesia 0.25.% bupivacaine, with epinephrine   Procedure Details  The patient was seen in the Holding Room. The risks, benefits, complications, treatment options, and expected outcomes were discussed with the patient. The possibilities of reaction to medication, pulmonary aspiration, bleeding, infection, the need for additional procedures, failure to diagnose a condition, and creating a complication requiring transfusion or operation were discussed with the patient. The patient concurred with the proposed plan, giving informed consent.  The site of surgery properly noted/marked. The patient was taken to Operating Room # 11, identified as Jean Nash and the procedure verified as Bilateral Mastectomy and right Sentinel Node Biopsy, possible right axillary lymph node dissection. A Time Out was held and the above information confirmed.  The methylene blue was injected in the subareolar location.    After induction of anesthesia, the bilateral arm, breast, and chest were prepped and draped in standard fashion.  The left breast was addressed first. The borders of the breast were identified and marked.  The incisions of the breast was drawn out to make sure incision lines were equidistant in length. The superior incision was made with the #10 blade.  The marcaine/saline mixture was infiltrated into the superior flap.  Mastectomy hooks were used to provide elevation of the skin edges, and the Plasma blade was used to create the mastectomy flaps.  The dissection was taken to the fascia of the pectoralis major.   The penetrating vessels were clipped.  The superior flap was taken medially to the lateral sternal border, superiorly to the inferior border of the clavicle.  The inferior flap was similarly created, inferiorly to the inframammary fold and laterally to the border of the latissimus.  The breast was taken off including the pectoralis fascia and the axillary tail marked.    The right breast was addressed similarly.    Using a hand-held gamma probe, axillary sentinel nodes were identified.  4 level 2 axillary sentinel nodes were removed and submitted to pathology.  The findings are below.  The lymphovascular channels were clipped with metal clips.    These were sent for frozen section.      The wound was irrigated.  One 19 Fr Blake drain was placed on the left.  The wound was adjusted to correct for dog ears.  The left side was closed with 3-0 vicryl and 4-0 monocryl in a T fashion.    The sentinel nodes 3/4 were positive, so I proceeded to ALND.   An axillary dissection was performed with removal of the associated lymph nodes and surrounding adipose tissue. This included levels I and II. This was accomplished by exposing the axillary vein anteriorly and inferiorly to the level of the pectoralis minor and laterally over the latissimus dorsi muscle. Posteriorly, the dissection continued to the subscapularis.  Small venous tributaries, lymphatics, and vessels were clipped and ligated or cauterized and divided. The subscapularis muscle was skeletonized. The long thoracic and thoracodorsal neurovascular bundles were identified and preserved.  The wound was irrigated and closed with a 3-0 Vicryl deep dermal interrupted and a 4-0 vicryl subcuticular closure in layers.    Two 19 Blake drains were placed laterally.   Hemostasis was  achieved with cautery.  The wound was irrigated and closed with a 3-0 Vicryl deep dermal interrupted sutures and 4-0 Vicryl subcuticular closure in layers.    Sterile dressings were  applied. At the end of the operation, all sponge, instrument, and needle counts were correct.  Findings: grossly clear surgical margins, SLN 1 and 2 palpable. SLN #3 230 cps, SLN #4 340 cps  Estimated Blood Loss:  less than 50 mL         Drains: 3 19 Blakes and an OnQ                Specimens: Left breast, R breast and 4 axillary sentinel nodes         Complications:  None; patient tolerated the procedure well.         Disposition: PACU - hemodynamically stable.         Condition: stable

## 2013-08-30 NOTE — Anesthesia Postprocedure Evaluation (Signed)
  Anesthesia Post-op Note  Patient: Jean Nash  Procedure(s) Performed: Procedure(s) with comments: Bilateral Breast Mastectomy  (Left) RIGHT  AXILLARY SENTINEL LYMPH NODE BIOPSY; Right Axillary Node Disection (Right) - Right side nuc med 7:00   Patient Location: PACU  Anesthesia Type:General  Level of Consciousness: awake and alert   Airway and Oxygen Therapy: Patient Spontanous Breathing  Post-op Pain: mild  Post-op Assessment: Post-op Vital signs reviewed, Patient's Cardiovascular Status Stable, Respiratory Function Stable, Patent Airway, No signs of Nausea or vomiting and Pain level controlled  Post-op Vital Signs: Reviewed and stable  Complications: No apparent anesthesia complications

## 2013-08-30 NOTE — Interval H&P Note (Signed)
History and Physical Interval Note:  08/30/2013 7:22 AM  Jean Nash  has presented today for surgery, with the diagnosis of RIGHT BREAST CANCER   The various methods of treatment have been discussed with the patient and family. After consideration of risks, benefits and other options for treatment, the patient has consented to  Procedure(s) with comments: LEFT SIMPLE MASTECTOMY, RIGHT MASTECTOMY WITH AXILLARY SENTINEL LYMPH NODE BIOPSY (Left) RIGHT MASTECTOMY WITH AXILLARY SENTINEL LYMPH NODE BIOPSY (Right) - Right side nuc med 7:00  as a surgical intervention .  If multiple nodes are positive, we will do ALND.  The patient's history has been reviewed, patient examined, no change in status, stable for surgery.  I have reviewed the patient's chart and labs.  Questions were answered to the patient's satisfaction.     Koray Soter

## 2013-08-31 ENCOUNTER — Encounter (HOSPITAL_COMMUNITY): Payer: Self-pay | Admitting: Certified Registered Nurse Anesthetist

## 2013-08-31 ENCOUNTER — Encounter (HOSPITAL_COMMUNITY): Payer: Medicare Other | Admitting: Certified Registered Nurse Anesthetist

## 2013-08-31 ENCOUNTER — Encounter (HOSPITAL_COMMUNITY)
Admission: RE | Disposition: A | Discharge status: Home / Self Care | Payer: Self-pay | Source: Ambulatory Visit | Attending: General Surgery

## 2013-08-31 ENCOUNTER — Inpatient Hospital Stay (HOSPITAL_COMMUNITY): Payer: Medicare Other | Admitting: Certified Registered Nurse Anesthetist

## 2013-08-31 DIAGNOSIS — IMO0002 Reserved for concepts with insufficient information to code with codable children: Secondary | ICD-10-CM

## 2013-08-31 HISTORY — PX: EVACUATION BREAST HEMATOMA: SHX1537

## 2013-08-31 LAB — CBC
HCT: 28 % — ABNORMAL LOW (ref 36.0–46.0)
Hemoglobin: 9.4 g/dL — ABNORMAL LOW (ref 12.0–15.0)
MCHC: 33.6 g/dL (ref 30.0–36.0)
Platelets: 134 10*3/uL — ABNORMAL LOW (ref 150–400)
RBC: 3.13 MIL/uL — ABNORMAL LOW (ref 3.87–5.11)
WBC: 7.3 10*3/uL (ref 4.0–10.5)

## 2013-08-31 LAB — BASIC METABOLIC PANEL
Calcium: 8.1 mg/dL — ABNORMAL LOW (ref 8.4–10.5)
GFR calc non Af Amer: >90 mL/min (ref >90)
Glucose, Bld: 215 mg/dL — ABNORMAL HIGH (ref 70–99)
Potassium: 4 mEq/L (ref 3.5–5.1)
Sodium: 137 mEq/L (ref 135–145)

## 2013-08-31 LAB — PREPARE RBC (CROSSMATCH)

## 2013-08-31 SURGERY — EVACUATION, HEMATOMA, BREAST
Anesthesia: General | Site: Chest | Laterality: Left | Wound class: Clean

## 2013-08-31 MED ORDER — OXYCODONE HCL 5 MG/5ML PO SOLN: 5.0000 mg | Freq: Once | ORAL | Status: AC | PRN

## 2013-08-31 MED ORDER — EPHEDRINE SULFATE 50 MG/ML IJ SOLN
INTRAMUSCULAR | Status: DC | PRN
  Administered 2013-08-31: 20 mg via INTRAVENOUS

## 2013-08-31 MED ORDER — FENTANYL CITRATE 0.05 MG/ML IJ SOLN: 50.0000 ug | Freq: Once | INTRAMUSCULAR | Status: DC

## 2013-08-31 MED ORDER — ONDANSETRON HCL 4 MG/2ML IJ SOLN
INTRAMUSCULAR | Status: DC | PRN
  Administered 2013-08-31: 4 mg via INTRAMUSCULAR

## 2013-08-31 MED ORDER — HYDROMORPHONE HCL PF 1 MG/ML IJ SOLN: 0.2500 mg | INTRAMUSCULAR | Status: DC | PRN

## 2013-08-31 MED ORDER — CEFAZOLIN (ANCEF) 1 G IV SOLR
INTRAVENOUS | Status: DC | PRN
  Administered 2013-08-31: 2 g via INTRAMUSCULAR

## 2013-08-31 MED ORDER — ARTIFICIAL TEARS OP OINT
TOPICAL_OINTMENT | OPHTHALMIC | Status: DC | PRN
  Administered 2013-08-31: 1 via OPHTHALMIC

## 2013-08-31 MED ORDER — PROMETHAZINE HCL 25 MG/ML IJ SOLN: 6.2500 mg | INTRAMUSCULAR | Status: DC | PRN

## 2013-08-31 MED ORDER — DEXAMETHASONE SODIUM PHOSPHATE 4 MG/ML IJ SOLN
INTRAMUSCULAR | Status: DC | PRN
  Administered 2013-08-31: 8 mg via INTRAVENOUS

## 2013-08-31 MED ORDER — LIDOCAINE HCL 1 % IJ SOLN
INTRAMUSCULAR | Status: DC | PRN
  Administered 2013-08-31: 09:00:00 via INTRADERMAL

## 2013-08-31 MED ORDER — MIDAZOLAM HCL 2 MG/2ML IJ SOLN: 1.0000 mg | INTRAMUSCULAR | Status: DC | PRN

## 2013-08-31 MED ORDER — PROPOFOL 10 MG/ML IV BOLUS
INTRAVENOUS | Status: DC | PRN
  Administered 2013-08-31: 140 mg via INTRAVENOUS

## 2013-08-31 MED ORDER — LACTATED RINGERS IV SOLN
INTRAVENOUS | Status: DC | PRN
  Administered 2013-08-31: 08:00:00 via INTRAVENOUS

## 2013-08-31 MED ORDER — MIDAZOLAM HCL 5 MG/5ML IJ SOLN
INTRAMUSCULAR | Status: DC | PRN
  Administered 2013-08-31: 2 mg via INTRAVENOUS

## 2013-08-31 MED ORDER — OXYCODONE HCL 5 MG PO TABS: 5.0000 mg | ORAL_TABLET | Freq: Once | ORAL | Status: AC | PRN

## 2013-08-31 MED ORDER — 0.9 % SODIUM CHLORIDE (POUR BTL) OPTIME
TOPICAL | Status: DC | PRN
  Administered 2013-08-31: 3000 mL

## 2013-08-31 MED ORDER — PHENYLEPHRINE HCL 10 MG/ML IJ SOLN
INTRAMUSCULAR | Status: DC | PRN
  Administered 2013-08-31: 80 ug via INTRAVENOUS
  Administered 2013-08-31: 120 ug via INTRAVENOUS
  Administered 2013-08-31: 80 ug via INTRAVENOUS
  Administered 2013-08-31: 120 ug via INTRAVENOUS

## 2013-08-31 MED ORDER — LIDOCAINE HCL (CARDIAC) 20 MG/ML IV SOLN
INTRAVENOUS | Status: DC | PRN
  Administered 2013-08-31: 100 mg via INTRAVENOUS

## 2013-08-31 SURGICAL SUPPLY — 50 items
BINDER BREAST LRG (GAUZE/BANDAGES/DRESSINGS) IMPLANT
BINDER BREAST XLRG (GAUZE/BANDAGES/DRESSINGS) IMPLANT
BINDER BREAST XXLRG (GAUZE/BANDAGES/DRESSINGS) ×1 IMPLANT
BLADE SURG 10 STRL SS (BLADE) ×1 IMPLANT
BLADE SURG 15 STRL LF DISP TIS (BLADE) ×1 IMPLANT
BLADE SURG 15 STRL SS (BLADE) ×2
CANISTER SUCTION 2500CC (MISCELLANEOUS) ×2 IMPLANT
CHLORAPREP W/TINT 26ML (MISCELLANEOUS) ×2 IMPLANT
CLIP TI LARGE 6 (CLIP) IMPLANT
CLIP TI MEDIUM 6 (CLIP) ×1 IMPLANT
CLIP TI WIDE RED SMALL 6 (CLIP) ×1 IMPLANT
CLOSURE STERI-STRIP 1/4X4 (GAUZE/BANDAGES/DRESSINGS) ×1 IMPLANT
CONT SPEC 4OZ CLIKSEAL STRL BL (MISCELLANEOUS) ×2 IMPLANT
COVER SURGICAL LIGHT HANDLE (MISCELLANEOUS) ×2 IMPLANT
DRAPE CHEST BREAST 15X10 FENES (DRAPES) ×2 IMPLANT
DRAPE UTILITY 15X26 W/TAPE STR (DRAPE) ×4 IMPLANT
DRSG PAD ABDOMINAL 8X10 ST (GAUZE/BANDAGES/DRESSINGS) ×2 IMPLANT
ELECT CAUTERY BLADE 6.4 (BLADE) ×2 IMPLANT
ELECT REM PT RETURN 9FT ADLT (ELECTROSURGICAL) ×2
ELECTRODE REM PT RTRN 9FT ADLT (ELECTROSURGICAL) ×1 IMPLANT
GAUZE XEROFORM 1X8 LF (GAUZE/BANDAGES/DRESSINGS) ×1 IMPLANT
GLOVE BIO SURGEON STRL SZ 6 (GLOVE) ×2 IMPLANT
GLOVE BIOGEL PI IND STRL 6.5 (GLOVE) ×1 IMPLANT
GLOVE BIOGEL PI INDICATOR 6.5 (GLOVE) ×1
GOWN PREVENTION PLUS XXLARGE (GOWN DISPOSABLE) ×2 IMPLANT
GOWN STRL NON-REIN LRG LVL3 (GOWN DISPOSABLE) ×2 IMPLANT
KIT BASIN OR (CUSTOM PROCEDURE TRAY) ×2 IMPLANT
KIT ROOM TURNOVER OR (KITS) ×2 IMPLANT
NDL HYPO 25GX1X1/2 BEV (NEEDLE) ×1 IMPLANT
NEEDLE HYPO 25GX1X1/2 BEV (NEEDLE) ×2 IMPLANT
NS IRRIG 1000ML POUR BTL (IV SOLUTION) ×2 IMPLANT
PACK SURGICAL SETUP 50X90 (CUSTOM PROCEDURE TRAY) ×2 IMPLANT
PAD ARMBOARD 7.5X6 YLW CONV (MISCELLANEOUS) ×2 IMPLANT
PENCIL BUTTON HOLSTER BLD 10FT (ELECTRODE) ×2 IMPLANT
SPONGE GAUZE 4X4 12PLY (GAUZE/BANDAGES/DRESSINGS) ×2 IMPLANT
SPONGE LAP 18X18 X RAY DECT (DISPOSABLE) ×2 IMPLANT
STAPLER VISISTAT 35W (STAPLE) ×1 IMPLANT
STRIP CLOSURE SKIN 1/2X4 (GAUZE/BANDAGES/DRESSINGS) ×2 IMPLANT
SUT ETHILON 3 0 FSL (SUTURE) ×1 IMPLANT
SUT MNCRL AB 4-0 PS2 18 (SUTURE) ×3 IMPLANT
SUT SILK 2 0 FS (SUTURE) IMPLANT
SUT VIC AB 3-0 SH 27 (SUTURE) ×2
SUT VIC AB 3-0 SH 27X BRD (SUTURE) ×1 IMPLANT
SUT VIC AB 3-0 SH 8-18 (SUTURE) ×2 IMPLANT
SYR BULB IRRIGATION 50ML (SYRINGE) ×1 IMPLANT
SYR CONTROL 10ML LL (SYRINGE) ×2 IMPLANT
TOWEL OR 17X24 6PK STRL BLUE (TOWEL DISPOSABLE) ×2 IMPLANT
TOWEL OR 17X26 10 PK STRL BLUE (TOWEL DISPOSABLE) ×2 IMPLANT
TUBE CONNECTING 12X1/4 (SUCTIONS) ×2 IMPLANT
YANKAUER SUCT BULB TIP NO VENT (SUCTIONS) ×2 IMPLANT

## 2013-08-31 NOTE — Op Note (Signed)
PRE-OPERATIVE DIAGNOSIS: hematoma left mastectomy flap  POST-OPERATIVE DIAGNOSIS:  Same  PROCEDURE:  Procedure(s): Evacuation of left mastectomy hematoma, clip of penetrating vessels.    SURGEON:  Surgeon(s): Almond Lint, MD  ANESTHESIA:   general  DRAINS: 19 Fr Blake drain left chest wall  LOCAL MEDICATIONS USED:  NONE  SPECIMEN:  No Specimen  DISPOSITION OF SPECIMEN:  N/A  COUNTS:  YES  DICTATION: .Dragon Dictation  PLAN OF CARE: Admit to inpatient   PATIENT DISPOSITION:  PACU - hemodynamically stable.   PROCEDURE:     Patient was identified in the holding area and taken the operating room where she is placed on the operating room table. General anesthesia was induced. Her chest was prepped and draped in sterile fashion.  Timeout was performed to the surgical safety checklist. When all was correct, we continued.  The lateral portion of the left incision was opened.  Hematoma was evacuated.  The wound was explored for active bleeding. Bleeding was found along the pectoralis major.  This was corrected with clips and cautery.  A new 19 [place] Blake drain was placed in the left chest wall. This was secured with a 2-0 nylon suture.  The incision was reclosed with 3-0 Vicryl deep dermal interrupted sutures and 4-0 Monocryl running subcuticular suture.  The wound was cleaned, dried, and dressed with dry sterile dressings and a breast binder.  The patient was awakened from anesthesia and taken to the PACU in stable condition. Needle, sponge, and instrument counts were correct x2.

## 2013-08-31 NOTE — Transfer of Care (Signed)
Immediate Anesthesia Transfer of Care Note  Patient: Jean Nash  Procedure(s) Performed: Procedure(s): EVACUATION HEMATOMA BREAST (Left)  Patient Location: PACU  Anesthesia Type:General  Level of Consciousness: awake, alert , oriented and patient cooperative  Airway & Oxygen Therapy: Patient Spontanous Breathing and Patient connected to nasal cannula oxygen  Post-op Assessment: Report given to PACU RN, Post -op Vital signs reviewed and stable and Patient moving all extremities X 4  Post vital signs: Reviewed and stable  Complications: No apparent anesthesia complications

## 2013-08-31 NOTE — Anesthesia Preprocedure Evaluation (Addendum)
Anesthesia Evaluation  Patient identified by MRN, date of birth, ID band Patient awake    Reviewed: Allergy & Precautions, H&P , NPO status , Patient's Chart, lab work & pertinent test results  Airway Mallampati: II TM Distance: <3 FB Neck ROM: full    Dental  (+) Teeth Intact and Dental Advisory Given   Pulmonary asthma , former smoker,  breath sounds clear to auscultation        Cardiovascular Rhythm:Regular Rate:Normal     Neuro/Psych  Headaches, Depression  Neuromuscular disease    GI/Hepatic hiatal hernia, GERD-  ,  Endo/Other  diabetesHypothyroidism   Renal/GU      Musculoskeletal  (+) Fibromyalgia -  Abdominal (+) + obese,   Peds  Hematology   Anesthesia Other Findings   Reproductive/Obstetrics                           Anesthesia Physical Anesthesia Plan  ASA: III  Anesthesia Plan: General   Post-op Pain Management:    Induction: Intravenous  Airway Management Planned: LMA  Additional Equipment:   Intra-op Plan:   Post-operative Plan: Extubation in OR  Informed Consent: I have reviewed the patients History and Physical, chart, labs and discussed the procedure including the risks, benefits and alternatives for the proposed anesthesia with the patient or authorized representative who has indicated his/her understanding and acceptance.     Plan Discussed with: CRNA and Surgeon  Anesthesia Plan Comments:         Anesthesia Quick Evaluation

## 2013-08-31 NOTE — Progress Notes (Signed)
Patient is better this morning with one unit of PRBCs.  BP up to 97/51.  Heart rate still in the 70"s.  She has felt better since I saw her last night and started to bolus her with albumin.  Her left Harrison Mons drain continues to lose its charge and does not seem very effective.  Marta Lamas. Gae Bon, MD, FACS [phone] [facility]

## 2013-08-31 NOTE — Preoperative (Signed)
Beta Blockers   Reason not to administer Beta Blockers:Not Applicable 

## 2013-08-31 NOTE — Anesthesia Postprocedure Evaluation (Signed)
  Anesthesia Post-op Note  Patient: Jean Nash  Procedure(s) Performed: Procedure(s): EVACUATION HEMATOMA BREAST (Left)  Patient Location: PACU  Anesthesia Type:General  Level of Consciousness: awake and alert   Airway and Oxygen Therapy: Patient Spontanous Breathing  Post-op Pain: mild  Post-op Assessment: Post-op Vital signs reviewed, Patient's Cardiovascular Status Stable, Respiratory Function Stable, Patent Airway, No signs of Nausea or vomiting and Pain level controlled  Post-op Vital Signs: Reviewed and stable  Complications: No apparent anesthesia complications

## 2013-09-01 LAB — CBC
Hemoglobin: 8.8 g/dL — ABNORMAL LOW (ref 12.0–15.0)
MCHC: 33.3 g/dL (ref 30.0–36.0)
Platelets: 148 10*3/uL — ABNORMAL LOW (ref 150–400)
RBC: 2.93 MIL/uL — ABNORMAL LOW (ref 3.87–5.11)
WBC: 7.8 10*3/uL (ref 4.0–10.5)

## 2013-09-01 LAB — TYPE AND SCREEN
ABO/RH(D): A POS
Antibody Screen: NEGATIVE
Unit division: 0

## 2013-09-01 LAB — BASIC METABOLIC PANEL
Chloride: 108 mEq/L (ref 96–112)
GFR calc non Af Amer: >90 mL/min (ref >90)
Glucose, Bld: 106 mg/dL — ABNORMAL HIGH (ref 70–99)
Potassium: 4.2 mEq/L (ref 3.5–5.1)
Sodium: 142 mEq/L (ref 135–145)

## 2013-09-01 MED ORDER — OXYCODONE-ACETAMINOPHEN 5-325 MG PO TABS: 1.0000 | ORAL_TABLET | ORAL | Status: DC | PRN

## 2013-09-01 NOTE — Discharge Summary (Signed)
Physician Discharge Summary  [name] MRN: [id number] DOB/AGE: May 18, 1941 72 y.o.  Admit date: 08/30/2013 Discharge date: 09/01/2013  Admission Diagnoses: Patient Active Problem List   Diagnosis Date Noted  . Breast cancer of lower-outer quadrant of right female breast 08/09/2013    Priority: High  . Neck pain 06/22/2013  . Left knee pain 11/29/2012  . Cluster headache 10/04/2012  . Gastroenteritis 04/26/2012  . Reactive depression (situational) 04/26/2012  . Right wrist pain 02/02/2012  . Right foot pain 10/04/2011  . Tinea capitis 10/04/2011  . Skin moles 06/05/2011  . Status post total knee replacement 04/06/2011  . Overactive bladder 03/14/2011  . MOLE 12/21/2010  . KNEE PAIN, LEFT 08/31/2010  . HEARING LOSS 08/17/2010  . PSORIASIS 02/26/2010  . HIRSUTISM 06/16/2009  . SHOULDER PAIN, LEFT 11/21/2008  . HELICOBACTER PYLORI INFECTION, HX OF 06/18/2008  . CYST, IDIOPATHIC 05/20/2008  . FOOT PAIN 04/22/2008  . IRRITABLE BOWEL SYNDROME 04/15/2008  . ASTHMA 03/19/2008  . GERD 03/19/2008  . COLONIC POLYPS, HX OF 03/19/2008  . NEPHROLITHIASIS, HX OF 03/19/2008  . HYPOTHYROIDISM 03/18/2008  . HYPERLIPIDEMIA 03/18/2008  . INSOMNIA, CHRONIC 03/18/2008  . FIBROMYALGIA 03/18/2008  . DIABETES MELLITUS, TYPE II, BORDERLINE 03/18/2008    Discharge Diagnoses:  Same, plus post mastectomy bleeding with takeback.  Discharged Condition: stable  Hospital Course: Pt was admitted after bilateral mastectomies with right SLN bx, followed by ALND.  She had apparent bleeding suddenly on POD 0.  She received several boluses of fluid and albumin.  She also received a unit of blood.  She was taken back to the OR on POD 1 for washout.  A small perforating vessel was identified and clipped.  A new drain was placed.  She did well after this surgery without hypotension or significant pain.  She is stable for discharge.  She has had drain teaching.  She is using oral narcotics.   She is ambulatory.    Consults: None  Significant Diagnostic Studies: labs: HCT prior 26.4  Treatments: surgery: see above.    Discharge Exam: Blood pressure 121/54, pulse 74, temperature 97.9 F (36.6 C), temperature source Oral, resp. rate 18, height 4\' 11"  (1.499 m), weight 190 lb 4.1 oz (86.3 kg), SpO2 98.00%. General appearance: alert, cooperative and no distress Chest wall: approp bilateral chest wall tenderness GI: soft, non-tender; bowel sounds normal; no masses,  no organomegaly Extremities: extremities normal, atraumatic, no cyanosis or edema  Disposition: 01-Home or Self Care  Discharge Orders   Future Appointments Provider Department Dept Phone   09/12/2013 4:00 PM Lowella Dell, MD [facility] MEDICAL ONCOLOGY [phone]   11/01/2013 10:00 AM Baltazar Najjar [facility] MEDICAL ONCOLOGY [phone]   11/01/2013 11:00 AM Dava Najjar Idelle Jo [facility] MEDICAL ONCOLOGY [phone]   11/06/2013 9:15 AM Max Maud Deed, DPM [facility] [phone]   12/21/2013 3:15 PM Doe-Hyun Sherran Needs, DO [facility] [phone]   Future Orders Complete By Expires   Call MD for:  difficulty breathing, headache or visual disturbances  As directed    Call MD for:  persistant nausea and vomiting  As directed    Call MD for:  redness, tenderness, or signs of infection (pain, swelling, redness, odor or green/yellow discharge around incision site)  As directed    Call MD for:  severe uncontrolled pain  As directed    Call MD for:  temperature >100.4  As directed  Change dressing (specify)  As directed    Comments:     Empty drains BID, measure and record.  Recharge.  Dry gauze as needed to chest walls.   Diet - low sodium heart healthy  As directed    Driving Restrictions  As directed    Comments:     No driving.   Increase activity slowly  As directed    Lifting restrictions  As directed     Comments:     No lifting over 10 pounds.       Medication List         ACIDOPHILUS PROBIOTIC BLEND Caps  Take 1 capsule by mouth daily. 75mg  cap     amoxicillin 500 MG capsule  Commonly known as:  AMOXIL  Take 4 capsules 1 hour before dental procedures     butalbital-acetaminophen-caffeine 50-325-40-30 MG per capsule  Commonly known as:  FIORICET WITH CODEINE  Take 1 capsule by mouth every 4 (four) hours as needed.     calcium-vitamin D 500-200 MG-UNIT per tablet  Commonly known as:  OSCAL WITH D  Take 1 tablet by mouth 2 (two) times daily.     cetirizine 10 MG tablet  Commonly known as:  ZYRTEC  Take 10 mg by mouth daily.     citalopram 20 MG tablet  Commonly known as:  CELEXA  Take 0.5 tablets (10 mg total) by mouth daily.     diazepam 5 MG tablet  Commonly known as:  VALIUM  Take 1 tablet (5 mg total) by mouth every 6 (six) hours as needed.     diclofenac sodium 1 % Gel  Commonly known as:  VOLTAREN  Apply 1 application topically as needed.     esomeprazole 40 MG capsule  Commonly known as:  NEXIUM  Take 1 capsule (40 mg total) by mouth daily.     LASTACAFT 0.25 % Soln  Generic drug:  Alcaftadine  Apply 1 drop to eye daily as needed (for eyes).     levothyroxine 50 MCG tablet  Commonly known as:  SYNTHROID, LEVOTHROID  Take 1 tablet (50 mcg total) by mouth daily.     LUTEIN PO  Take 50 mg by mouth daily.     meloxicam 7.5 MG tablet  Commonly known as:  MOBIC  Take 1 tablet (7.5 mg total) by mouth daily.     metFORMIN 1000 MG tablet  Commonly known as:  GLUCOPHAGE  Take 1 tablet (1,000 mg total) by mouth 2 (two) times daily with a meal.     mirabegron ER 25 MG Tb24 tablet  Commonly known as:  MYRBETRIQ  Take 1 tablet (25 mg total) by mouth daily.     oxyCODONE-acetaminophen 5-325 MG per tablet  Commonly known as:  PERCOCET/ROXICET  Take 1-2 tablets by mouth every 4 (four) hours as needed.     polycarbophil 625 MG tablet  Commonly known as:   FIBERCON  Take 625 mg by mouth daily.     pregabalin 75 MG capsule  Commonly known as:  LYRICA  Take 1 capsule (75 mg total) by mouth 2 (two) times daily.     simvastatin 10 MG tablet  Commonly known as:  ZOCOR  Take 1 tablet (10 mg total) by mouth at bedtime.     STOOL SOFTENER 100 MG capsule  Generic drug:  docusate sodium  Take 100 mg by mouth 3 (three) times daily as needed.     temazepam 7.5 MG capsule  Commonly known as:  RESTORIL  Take 1 capsule (7.5 mg total) by mouth at bedtime as needed.           Follow-up Information   Follow up with [facility], MD. Schedule an appointment as soon as possible for a visit in 1 week.   Specialty:  General Surgery   Contact information:   [address] [place] [address] [phone]       Signed: Almond Lint 09/01/2013, 11:16 AM

## 2013-09-03 ENCOUNTER — Other Ambulatory Visit (INDEPENDENT_AMBULATORY_CARE_PROVIDER_SITE_OTHER): Payer: Self-pay

## 2013-09-03 ENCOUNTER — Encounter (HOSPITAL_COMMUNITY): Payer: Self-pay | Admitting: General Surgery

## 2013-09-03 ENCOUNTER — Encounter: Payer: Self-pay | Admitting: Internal Medicine

## 2013-09-03 ENCOUNTER — Telehealth: Payer: Self-pay | Admitting: Internal Medicine

## 2013-09-03 ENCOUNTER — Ambulatory Visit (INDEPENDENT_AMBULATORY_CARE_PROVIDER_SITE_OTHER): Payer: Medicare Other | Admitting: Surgery

## 2013-09-03 VITALS — BP 132/76 | HR 76 | Temp 98.6°F | Resp 18 | Ht 63.0 in | Wt 192.0 lb

## 2013-09-03 DIAGNOSIS — C50511 Malignant neoplasm of lower-outer quadrant of right female breast: Secondary | ICD-10-CM

## 2013-09-03 DIAGNOSIS — Z4801 Encounter for change or removal of surgical wound dressing: Secondary | ICD-10-CM

## 2013-09-03 DIAGNOSIS — C50519 Malignant neoplasm of lower-outer quadrant of unspecified female breast: Secondary | ICD-10-CM

## 2013-09-03 MED ORDER — MIRABEGRON ER 25 MG PO TB24: 25.0000 mg | ORAL_TABLET | Freq: Every day | ORAL | Status: DC

## 2013-09-03 NOTE — Telephone Encounter (Signed)
rx sent in electronically, pt notified via mychart 

## 2013-09-03 NOTE — Progress Notes (Addendum)
[facility]  Ovidio Kin, MD,  FACS [address] [place]  [address] [place], [place]    [address] Phone:  [phone] FAX:  [phone]   Re:   Jean Nash DOB:   [birth year] MRN:   [id number]  Urgent Office  ASSESSMENT AND PLAN: 1.  S/P Bilateral mastectomy with right ALND on 08/30/2013 - Dr. Marilynne Halsted  The patient had to go back to the OR to evacuate a left mastectomy hematoma on 08/31/2013.  Right chest wound/mastectomy looks good.  Left chest wound/mastectomy has a moderate hematoma.   Will move office visit up to tomorrow so Dr. Donell Beers can evaluate wound and decide whether anything else ought to be done.  Will arrange home health care to go by the house to assist in drain management.  HISTORY OF PRESENT ILLNESS: Chief Complaint  Patient presents with  . Follow-up    masty drain clogged   Jean Nash is a 72 y.o. (DOB: 11/18/1940)  whtie  female who is a patient of Thomos Lemons, DO and comes to Urgent Office today for questions about management of the drains. The patient's husband is here.  Her daughter was help managing the drains, but has left today.  Her husband does not feel that he can manage the drains.  She came in mainly today because she felt she received little instruction about home care and how to care for her wounds.  She has had no fever or other concerns.  Past Medical History  Diagnosis Date  . Chest pain, atypical     Negative stress test 02/2009  . Other and unspecified hyperlipidemia   . Personal history of urinary calculi   . Pain in joint, shoulder region   . Irritable bowel syndrome   . Chronic insomnia   . GERD (gastroesophageal reflux disease)   . History of colonic polyps   . Fibromyalgia   . Hypothyroidism   . Other abnormal glucose   . Stress incontinence, female   . Macular degeneration   . Cluster headaches     history of migraines  . Meniscus tear     history of right knee torn meniscus  . Arthritis   .  Kidney stone   . Cataract   . Breast cancer   . Depression   . Pneumonia 6962,9528  . Asthma     hx of -no inhalers, no problems  . H/O hiatal hernia     SOCIAL HISTORY: Married.  PHYSICAL EXAM: BP 132/76  Pulse 76  Temp(Src) 98.6 F (37 C)  Resp 18  Ht 5\' 3"  (1.6 m)  Wt 192 lb (87.091 kg)  BMI 34.02 kg/m2  Chest:  Both breast absent.  Two drains on right and one on left.  Drains last measured 1/2/3 - 45/17/14 cc.  (But that is only for today - done by their daughter)  The right side looks good with wound healing nicely.  The left side has a moderate hematoma - there is no evidence of infection.    DATA REVIEWED: Epic notes  Ovidio Kin, MD,  [facility], [place] [address] [place]  [address]   [place], [place]    [address] Phone:  [phone] FAX:  [phone]

## 2013-09-03 NOTE — Telephone Encounter (Signed)
Pt's husband calling to report [facility] have not received rx refill for mirabegron ER (MYRBETRIQ) 25 MG TB24 tablet that was supposed to have been sent over a week ago.  Please call pt back with status of refill.

## 2013-09-04 ENCOUNTER — Encounter: Payer: Self-pay | Admitting: *Deleted

## 2013-09-04 ENCOUNTER — Other Ambulatory Visit: Payer: Self-pay | Admitting: Oncology

## 2013-09-04 ENCOUNTER — Encounter (INDEPENDENT_AMBULATORY_CARE_PROVIDER_SITE_OTHER): Payer: Self-pay | Admitting: General Surgery

## 2013-09-04 ENCOUNTER — Ambulatory Visit (INDEPENDENT_AMBULATORY_CARE_PROVIDER_SITE_OTHER): Payer: Medicare Other | Admitting: General Surgery

## 2013-09-04 VITALS — BP 138/86 | HR 88 | Temp 97.3°F | Resp 16 | Ht <= 58 in | Wt 187.4 lb

## 2013-09-04 DIAGNOSIS — C50511 Malignant neoplasm of lower-outer quadrant of right female breast: Secondary | ICD-10-CM

## 2013-09-04 DIAGNOSIS — C50519 Malignant neoplasm of lower-outer quadrant of unspecified female breast: Secondary | ICD-10-CM

## 2013-09-04 NOTE — Progress Notes (Signed)
HISTORY: Patient is status post bilateral mastectomies with right occipital lymph node biopsy followed by axillary lymph node dissection. She had to go back to the operating room on postop day 1 for hematoma. She was seen by Dr. Daphine Deutscher on Sunday Dr. Ezzard Standing yesterday. She does have some significant issues with weakness and dark current hematoma. She continues to have drainage from her drains. Her husband was having difficulty with them so we are getting her set up upon well.    EXAM: General:  Alert and oriented Incision:  No evidence of infection.  Drains with serosang drainage.  Moderate hematoma on left.     PATHOLOGY: 1. Lymph node, sentinel, biopsy, Right axillary #1 - ONE LYMPH NODE, POSITIVE FOR METASTATIC MAMMARY CARCINOMA (1/1) - TUMOR DEPOSIT IS 0.8 CM - FOCAL EXTRACAPSULAR TUMOR EXTENSION IDENTIFIED. 2. Lymph node, sentinel, biopsy, Right axilla - ONE LYMPH NODE, NEGATIVE FOR TUMOR (0/1). 3. Lymph node, sentinel, biopsy, Right axilla - ONE LYMPH NODE, POSITIVE FOR METASTATIC MAMMARY CARCINOMA (1/1). SEE COMMENT. - TUMOR DEPOSIT IS 1.5 CM . - NO EXTRACAPSULAR TUMOR EXTENSION IDENTIFIED. 4. Lymph node, sentinel, biopsy, Right axillary - ONE LYMPH NODE, POSITIVE FOR METASTATIC MAMMARY CARCINOMA (1/1). - TUMOR DEPOSIT IS 0.5 CM - NO EXTRACAPSULAR TUMOR EXTENSION IDENTIFIED. 5. Breast, simple mastectomy, Left - BENIGN BREAST TISSUE, SEE COMMENT. - CALCIFICATIONS IDENTIFIED. - NEGATIVE FOR ATYPIA OR MALIGNANCY. - ONE LYMPH NODE, NEGATIVE FOR TUMOR (0/1) - SURGICAL MARGINS, NEGATIVE FOR ATYPIA OR MALIGNANCY. 6. Breast, simple mastectomy, Right - INVASIVE LOBULAR CARCINOMA, SEE COMMENT. - NO LYMPHOVASCULAR INVASION IDENTIFIED. - SURGICAL MARGINS, NEGATIVE FOR TUMOR. - THREE LYMPH NODES, POSITIVE FOR METASTATIC MAMMARY CARCINOMA (3/3). - TUMOR DEPOSITS ARE 0.9 CM, 0.6 CM AND 2.3 CM. - EXTRACAPSULAR TUMOR EXTENSION IDENTIFIED. - SEE TUMOR SYNOPTIC TEMPLATE BELOW. 7. Lymph  nodes, regional resection, Right axillary contents - THREE LYMPH NODE, POSITIVE FOR METASTATIC MAMMARY CARCINOMA (3/9) - TUMOR DEPOSITS ARE 0.8 CM, 0.3CM AND 0.8 CM. - FOCAL EXTRA CAPSULE TUMOR EXTENSION IDENTIFIED. - SEE COMMENT.   ASSESSMENT AND PLAN:   Breast cancer of lower-outer quadrant of right female breast Pt will be referred back to radiation oncology due to the 9 positive LN on ALND.    She sees Dr. Darnelle Catalan next week.          Maudry Diego, MD Surgical Oncology, General & Endocrine Surgery [facility]  Thomos Lemons, DO Artist Pais, Doe-Hyun R, DO

## 2013-09-04 NOTE — Progress Notes (Signed)
No oncotype sent d/t 3 +LN.

## 2013-09-04 NOTE — Patient Instructions (Signed)
Follow up with me on Friday.  I will send messages to Drs. Magrinat and Dayton Scrape regarding appts.

## 2013-09-04 NOTE — Assessment & Plan Note (Signed)
Pt will be referred back to radiation oncology due to the 9 positive LN on ALND.    She sees Dr. Darnelle Catalan next week.

## 2013-09-05 ENCOUNTER — Other Ambulatory Visit (INDEPENDENT_AMBULATORY_CARE_PROVIDER_SITE_OTHER): Payer: Self-pay

## 2013-09-05 ENCOUNTER — Other Ambulatory Visit (INDEPENDENT_AMBULATORY_CARE_PROVIDER_SITE_OTHER): Payer: Self-pay | Admitting: General Surgery

## 2013-09-05 DIAGNOSIS — D62 Acute posthemorrhagic anemia: Secondary | ICD-10-CM | POA: Diagnosis not present

## 2013-09-05 DIAGNOSIS — Z4801 Encounter for change or removal of surgical wound dressing: Secondary | ICD-10-CM | POA: Diagnosis not present

## 2013-09-05 DIAGNOSIS — C50919 Malignant neoplasm of unspecified site of unspecified female breast: Secondary | ICD-10-CM | POA: Diagnosis not present

## 2013-09-05 DIAGNOSIS — C50911 Malignant neoplasm of unspecified site of right female breast: Secondary | ICD-10-CM

## 2013-09-05 DIAGNOSIS — Z483 Aftercare following surgery for neoplasm: Secondary | ICD-10-CM | POA: Diagnosis not present

## 2013-09-05 DIAGNOSIS — H543 Unqualified visual loss, both eyes: Secondary | ICD-10-CM | POA: Diagnosis not present

## 2013-09-05 DIAGNOSIS — IMO0001 Reserved for inherently not codable concepts without codable children: Secondary | ICD-10-CM | POA: Diagnosis not present

## 2013-09-05 LAB — CBC
HCT: 25.9 % — ABNORMAL LOW (ref 36.0–46.0)
Hemoglobin: 8.5 g/dL — ABNORMAL LOW (ref 12.0–15.0)
MCH: 30 pg (ref 26.0–34.0)
MCHC: 32.8 g/dL (ref 30.0–36.0)
RBC: 2.83 MIL/uL — ABNORMAL LOW (ref 3.87–5.11)
WBC: 11.1 10*3/uL — ABNORMAL HIGH (ref 4.0–10.5)

## 2013-09-05 LAB — BASIC METABOLIC PANEL
CO2: 28 mEq/L (ref 19–32)
Chloride: 102 mEq/L (ref 96–112)
Potassium: 4.2 mEq/L (ref 3.5–5.3)
Sodium: 137 mEq/L (ref 135–145)

## 2013-09-06 ENCOUNTER — Telehealth (INDEPENDENT_AMBULATORY_CARE_PROVIDER_SITE_OTHER): Payer: Self-pay

## 2013-09-06 LAB — URINALYSIS
Glucose, UA: NEGATIVE mg/dL
Hgb urine dipstick: NEGATIVE
Nitrite: POSITIVE — AB
pH: 6 (ref 5.0–8.0)

## 2013-09-06 NOTE — Telephone Encounter (Signed)
Pt still has UTI.  Levaquin 750mg  1 qd x 7d to be called in to [facility].

## 2013-09-07 ENCOUNTER — Encounter (INDEPENDENT_AMBULATORY_CARE_PROVIDER_SITE_OTHER): Payer: Self-pay | Admitting: General Surgery

## 2013-09-07 ENCOUNTER — Ambulatory Visit (INDEPENDENT_AMBULATORY_CARE_PROVIDER_SITE_OTHER): Payer: Medicare Other | Admitting: General Surgery

## 2013-09-07 VITALS — BP 138/76 | HR 80 | Temp 98.6°F | Resp 16 | Ht 59.0 in | Wt 190.4 lb

## 2013-09-07 DIAGNOSIS — C50511 Malignant neoplasm of lower-outer quadrant of right female breast: Secondary | ICD-10-CM

## 2013-09-07 DIAGNOSIS — C50519 Malignant neoplasm of lower-outer quadrant of unspecified female breast: Secondary | ICD-10-CM

## 2013-09-07 NOTE — Patient Instructions (Signed)
Follow up November 3.    Call Bernie if drain output comes down and you think one of the drains can come out.    They need to be 20 mL per 24 hours times 3 days in a row.

## 2013-09-10 ENCOUNTER — Telehealth: Payer: Self-pay | Admitting: *Deleted

## 2013-09-10 DIAGNOSIS — C50919 Malignant neoplasm of unspecified site of unspecified female breast: Secondary | ICD-10-CM | POA: Diagnosis not present

## 2013-09-10 DIAGNOSIS — D62 Acute posthemorrhagic anemia: Secondary | ICD-10-CM | POA: Diagnosis not present

## 2013-09-10 DIAGNOSIS — IMO0001 Reserved for inherently not codable concepts without codable children: Secondary | ICD-10-CM | POA: Diagnosis not present

## 2013-09-10 DIAGNOSIS — H543 Unqualified visual loss, both eyes: Secondary | ICD-10-CM | POA: Diagnosis not present

## 2013-09-10 DIAGNOSIS — Z483 Aftercare following surgery for neoplasm: Secondary | ICD-10-CM | POA: Diagnosis not present

## 2013-09-10 DIAGNOSIS — Z4801 Encounter for change or removal of surgical wound dressing: Secondary | ICD-10-CM | POA: Diagnosis not present

## 2013-09-10 NOTE — Telephone Encounter (Signed)
Spoke to pt to f/u after surgery.  Pt relate she is doing well and not in a lot of pain.  She is a little uncomfortable b/c of the binder and inability of not being able to raise her arm.  But she understands she isn't  able to do that at this point.  Confirmed f/u with Dr. Darnelle Catalan on 09/12/13 to discuss f/u treatment.  Pt denies further needs at this time.  Encourage pt to call with questions or concerns.  Received verbal understanding.  Contact information given.

## 2013-09-12 ENCOUNTER — Other Ambulatory Visit: Payer: Self-pay | Admitting: Oncology

## 2013-09-12 ENCOUNTER — Ambulatory Visit (HOSPITAL_BASED_OUTPATIENT_CLINIC_OR_DEPARTMENT_OTHER): Payer: Medicare Other | Admitting: Oncology

## 2013-09-12 VITALS — BP 106/67 | HR 87 | Temp 98.5°F | Resp 18 | Ht 59.0 in | Wt 186.2 lb

## 2013-09-12 DIAGNOSIS — C50511 Malignant neoplasm of lower-outer quadrant of right female breast: Secondary | ICD-10-CM

## 2013-09-12 DIAGNOSIS — C50519 Malignant neoplasm of lower-outer quadrant of unspecified female breast: Secondary | ICD-10-CM | POA: Diagnosis not present

## 2013-09-12 NOTE — Progress Notes (Signed)
ID: Jean Nash OB: [birth year]  MR#: [id number]  WUJ#:[id number]  PCP: Jean Lemons, DO GYN:   SU: Almond Lint OTHER MD: Chipper Herb  CHIEF COMPLAINT: "I have breast cancer".  HISTORY OF PRESENT ILLNESS: Jean Nash had routine screening mammography age 72 2014 showing no suspicious findings. However she was found to have breast density category C. She research this, was alarmed by the fact that mammographic sensitivity is significantly decreased when the breasts are dense, and given her family history of breast cancer she opted for proceeding to bilateral breast MRI. This was performed at [facility] 08/01/2013. It showed in the right breast an area of nodular and linear enhancement spanning approximately 8.4 cm. There were no abnormal appearing lymph nodes and the left breast was unremarkable.  Biopsy of an area around the middle of the abnormal section of the right breast on 08/08/2013, showed ([id number]) an invasive and in situ ductal carcinoma, the invasive tumor being grade 1, estrogen receptor 90% positive, progesterone receptor 90% positive, with an MIB-105%. HER-2 testing is pending.  The patient's subsequent history is as detailed below   INTERVAL HISTORY: Jean Nash returns today for followup of her breast cancer accompanied by her husband Jean Nash and their congregational nurse Jacklyn Shell. Since her last visit here she had bilateral mastectomies with right axillary lymph node dissection 08/30/2013. The final pathology ([id number]) showed the left breast to be benign. On the right there was an invasive lobular breast cancer measuring 5 mm, grade 1, involving 3 of 4 sentinel lymph nodes and 3 of 12 additional non-sentinel lymph nodes for a total of 6 lymph nodes involved out of a total of 16. All these were macro metastases, the largest measuring 2.3 cm, with extracapsular extension.   Interestingly, there was an ill-defined area of dense fibrous tissue in the lower outer quadrant of  the right breast measuring up to 10 cm. This is where the 5 mm tumor focus was noted. This area was extensively sampled but no other viable tumor was noted. Margins were negative. The patient is here today to discuss these results  REVIEW OF SYSTEMS: She had some bleeding immediately post op, which took her back to the OR. She then did well, with no fever or erythema, but with significant pain. She still has one drain in place. She sleeps poorly, and has to sleep in a recliner. She had a urinary tract infection which was treated with an antibiotic, to being completed today. She gets short of breath particularly when walking up stairs. She has rare headaches. She feels anxious and depressed. She is not constipated from her narcotics however. She is going to be taking tramadol, she tells me, instead of the oxycodone, because the oxycodone makes her too sleepy. A detailed review of systems today was otherwise noncontributory  PAST MEDICAL HISTORY: Past Medical History  Diagnosis Date  . Chest pain, atypical     Negative stress test 02/2009  . Other and unspecified hyperlipidemia   . Personal history of urinary calculi   . Pain in joint, shoulder region   . Irritable bowel syndrome   . Chronic insomnia   . GERD (gastroesophageal reflux disease)   . History of colonic polyps   . Fibromyalgia   . Hypothyroidism   . Other abnormal glucose   . Stress incontinence, female   . Macular degeneration   . Cluster headaches     history of migraines  . Meniscus tear     history of right knee torn  meniscus  . Arthritis   . Kidney stone   . Cataract   . Breast cancer   . Depression   . Pneumonia [id number]  . Asthma     hx of -no inhalers, no problems  . H/O hiatal hernia     PAST SURGICAL HISTORY: Past Surgical History  Procedure Laterality Date  . Appendectomy    . Cholecystectomy    . Abdominal hysterectomy    . Tonsillectomy    . Eye surgery      to repair macular hole  . Cataract  extraction, bilateral    . Knee arthroscopy      > 10 years ago  . Joint replacement  03/15/11    left knee replacement  . Colonoscopy    . Ganglion cyst excision      rt foot  . Foot arthroplasty      lt   . Mass excision  11/04/2011    Procedure: EXCISION MASS;  Surgeon: Wyn Forster., MD;  Location: [facility];  Service: Orthopedics;  Laterality: Right;  excisional biopsy right ulna mass  . Hemorrhoid surgery      03/1993  . Breast cyst aspiration      9 cysts  . Toe surgery      preventative crossover toe surg/right foot  . Toe surgery      left foot/screw  in 2nd toe  . Upper gastrointestinal endoscopy    . Skin tags removed      breast, panty line, neckline  . Bilateral total mastectomy with axillary lymph node dissection  08/30/2013    Dr Donell Beers  . Simple mastectomy with axillary sentinel node biopsy Left 08/30/2013    Procedure: Bilateral Breast Mastectomy ;  Surgeon: Almond Lint, MD;  Location: [facility] OR;  Service: General;  Laterality: Left;  Marland Kitchen Mastectomy w/ sentinel node biopsy Right 08/30/2013    Procedure: RIGHT  AXILLARY SENTINEL LYMPH NODE BIOPSY; Right Axillary Node Disection;  Surgeon: Almond Lint, MD;  Location: [facility] OR;  Service: General;  Laterality: Right;  Right side nuc med 7:00   . Evacuation breast hematoma Left 08/31/2013    Procedure: EVACUATION HEMATOMA BREAST;  Surgeon: Almond Lint, MD;  Location: [facility] OR;  Service: General;  Laterality: Left;    FAMILY HISTORY Family History  Problem Relation Age of Onset  . Stroke Mother     died age 65  . Diabetes Mother   . Breast cancer Mother   . Breast cancer Sister   . Breast cancer Paternal Aunt   . Diabetes Maternal Grandfather   . Breast cancer Paternal Grandmother   . Breast cancer Paternal Aunt    the patient's father lived to be 22. The patient's mother was diagnosed with breast cancer at age 26. She died at 38 from unrelated causes. The patient had no brothers, one sister there  are multiple second degree relatives with breast cancer, but no history of ovarian cancer in the family.  GYNECOLOGIC HISTORY:  Menarche age 79, first live birth age 53, she is GX P3. She had a hysterectomy at age 46. She did not take hormone replacement. She took birth control perhaps for 2 years, in the mid-18s. She did not have any complications from that.  SOCIAL HISTORY:  She is a retired Catering manager. Her husband Jean Nash use to be a Engineer, building services. Daughter Vanity Larsson is a pediatrician in [place]. Daughter Louellen Molder is a Warden/ranger in [place]. DaughterShashana Hermelinda Medicus is on all her  and of course a therapist living in [place]. The patient has 12 grandchildren. She attends the local syn[employer]    ADVANCED DIRECTIVES: In place   HEALTH MAINTENANCE: History  Substance Use Topics  . Smoking status: Former Games developer  . Smokeless tobacco: Never Used  . Alcohol Use: No     Colonoscopy: April 2014 PAP: Status post hysterectomy  Bone density: January 2014 Lipid panel:  No Known Allergies  Current Outpatient Prescriptions  Medication Sig Dispense Refill  . Alcaftadine (LASTACAFT) 0.25 % SOLN Apply 1 drop to eye daily as needed (for eyes).      Marland Kitchen amoxicillin (AMOXIL) 500 MG capsule Take 4 capsules 1 hour before dental procedures  4 capsule  1  . butalbital-acetaminophen-caffeine (FIORICET WITH CODEINE) 50-325-40-30 MG per capsule Take 1 capsule by mouth every 4 (four) hours as needed.  30 capsule  1  . calcium-vitamin D (OSCAL WITH D) 500-200 MG-UNIT per tablet Take 1 tablet by mouth 2 (two) times daily.      . cetirizine (ZYRTEC) 10 MG tablet Take 10 mg by mouth daily.      . citalopram (CELEXA) 20 MG tablet Take 0.5 tablets (10 mg total) by mouth daily.  90 tablet  1  . diazepam (VALIUM) 5 MG tablet Take 1 tablet (5 mg total) by mouth every 6 (six) hours as needed.  30 tablet  3  . diclofenac sodium (VOLTAREN) 1 % GEL Apply 1 application topically as needed.      . docusate  sodium (STOOL SOFTENER) 100 MG capsule Take 100 mg by mouth 3 (three) times daily as needed.       Marland Kitchen esomeprazole (NEXIUM) 40 MG capsule Take 1 capsule (40 mg total) by mouth daily.  90 capsule  1  . levothyroxine (SYNTHROID, LEVOTHROID) 50 MCG tablet Take 1 tablet (50 mcg total) by mouth daily.  90 tablet  3  . LUTEIN PO Take 50 mg by mouth daily.      . meloxicam (MOBIC) 7.5 MG tablet Take 1 tablet (7.5 mg total) by mouth daily.  90 tablet  1  . metFORMIN (GLUCOPHAGE) 1000 MG tablet Take 1 tablet (1,000 mg total) by mouth 2 (two) times daily with a meal.  180 tablet  3  . mirabegron ER (MYRBETRIQ) 25 MG TB24 tablet Take 1 tablet (25 mg total) by mouth daily.  90 tablet  3  . oxyCODONE-acetaminophen (PERCOCET/ROXICET) 5-325 MG per tablet Take 1-2 tablets by mouth every 4 (four) hours as needed.  30 tablet  0  . polycarbophil (FIBERCON) 625 MG tablet Take 625 mg by mouth daily.      . pregabalin (LYRICA) 75 MG capsule Take 1 capsule (75 mg total) by mouth 2 (two) times daily.  180 capsule  1  . Probiotic Product (ACIDOPHILUS PROBIOTIC BLEND) CAPS Take 1 capsule by mouth daily. 75mg  cap      . simvastatin (ZOCOR) 10 MG tablet Take 1 tablet (10 mg total) by mouth at bedtime.  90 tablet  1  . temazepam (RESTORIL) 7.5 MG capsule Take 1 capsule (7.5 mg total) by mouth at bedtime as needed.  90 capsule  1   No current facility-administered medications for this visit.    OBJECTIVE: Middle-aged white woman who appears stated age  20 Vitals:   09/12/13 1605  BP: 106/67  Pulse: 87  Temp: 98.5 F (36.9 C)  Resp: 18     Body mass index is 37.59 kg/(m^2).    ECOG FS:2 - Symptomatic, <50%  confined to bed  Ocular: Sclerae unicteric, pupils equal, round and reactive to light Ear-nose-throat: Oropharynx clear, dentition poor Lymphatic: No cervical or supraclavicular adenopathy Lungs no rales or rhonchi, good excursion bilaterally Heart regular rate and rhythm, no murmur appreciated Abd soft,  nontender, positive bowel sounds MSK no focal spinal tenderness, no upper extremity lymphedema Neuro: non-focal, well-oriented, appropriate affect Breasts: Status post bilateral mastectomies, with binder in place. The incisions were not uncovered; both axilla are benign   LAB RESULTS:  CMP     Component Value Date/Time   NA 137 09/05/2013 1523   NA 138 08/15/2013 1225   K 4.2 09/05/2013 1523   K 3.8 08/15/2013 1225   CL 102 09/05/2013 1523   CO2 28 09/05/2013 1523   CO2 24 08/15/2013 1225   GLUCOSE 102* 09/05/2013 1523   GLUCOSE 134 08/15/2013 1225   BUN 10 09/05/2013 1523   BUN 13.7 08/15/2013 1225   CREATININE 0.87 09/05/2013 1523   CREATININE 0.48* 09/01/2013 0550   CREATININE 0.7 08/15/2013 1225   CALCIUM 8.9 09/05/2013 1523   CALCIUM 9.3 08/15/2013 1225   PROT 6.8 08/15/2013 1225   PROT 6.3 06/22/2013 1618   ALBUMIN 3.6 08/15/2013 1225   ALBUMIN 4.0 06/22/2013 1618   AST 14 08/15/2013 1225   AST 13 06/22/2013 1618   ALT 27 08/15/2013 1225   ALT 19 06/22/2013 1618   ALKPHOS 66 08/15/2013 1225   ALKPHOS 58 06/22/2013 1618   BILITOT 0.60 08/15/2013 1225   BILITOT 0.5 06/22/2013 1618   GFRNONAA >90 09/01/2013 0550   GFRAA >90 09/01/2013 0550    I No results found for this basename: SPEP,  UPEP,   kappa and lambda light chains    Lab Results  Component Value Date   WBC 11.1* 09/05/2013   NEUTROABS 8.7* 08/15/2013   HGB 8.5* 09/05/2013   HCT 25.9* 09/05/2013   MCV 91.5 09/05/2013   PLT 333 09/05/2013      Chemistry      Component Value Date/Time   NA 137 09/05/2013 1523   NA 138 08/15/2013 1225   K 4.2 09/05/2013 1523   K 3.8 08/15/2013 1225   CL 102 09/05/2013 1523   CO2 28 09/05/2013 1523   CO2 24 08/15/2013 1225   BUN 10 09/05/2013 1523   BUN 13.7 08/15/2013 1225   CREATININE 0.87 09/05/2013 1523   CREATININE 0.48* 09/01/2013 0550   CREATININE 0.7 08/15/2013 1225      Component Value Date/Time   CALCIUM 8.9 09/05/2013 1523   CALCIUM 9.3 08/15/2013 1225   ALKPHOS 66  08/15/2013 1225   ALKPHOS 58 06/22/2013 1618   AST 14 08/15/2013 1225   AST 13 06/22/2013 1618   ALT 27 08/15/2013 1225   ALT 19 06/22/2013 1618   BILITOT 0.60 08/15/2013 1225   BILITOT 0.5 06/22/2013 1618       No results found for this basename: LABCA2    No components found with this basename: LABCA125    No results found for this basename: INR,  in the last 168 hours  Urinalysis    Component Value Date/Time   COLORURINE YELLOW 09/05/2013 1525   APPEARANCEUR CLEAR 09/05/2013 1525   LABSPEC 1.010 09/05/2013 1525   PHURINE 6.0 09/05/2013 1525   GLUCOSEU NEG 09/05/2013 1525   HGBUR NEG 09/05/2013 1525   BILIRUBINUR NEG 09/05/2013 1525   BILIRUBINUR n 07/04/2012 1642   KETONESUR NEG 09/05/2013 1525   PROTEINUR NEG 09/05/2013 1525   UROBILINOGEN 0.2 09/05/2013 1525  UROBILINOGEN 0.2 07/04/2012 1642   NITRITE POS* 09/05/2013 1525   NITRITE n 07/04/2012 1642   LEUKOCYTESUR MOD* 09/05/2013 1525    STUDIES: Nm Sentinel Node Inj-no Rpt (breast)  08/30/2013   CLINICAL DATA: right breast cancer   Sulfur colloid was injected intradermally by the nuclear medicine  technologist for breast cancer sentinel node localization.    ASSESSMENT: 72 y.o. [place] woman status post right breast lower outer quadrant biopsy 08/08/2013 for a clinical Te N0, stage IIA invasive ductal carcinoma, grade 1, estrogen receptor 90% positive, progesterone receptor 90% positive, with an MIB-1 of 12% and HER-2 negative  (1) status post bilateral mastectomies with right axillary lymph node dissection 08/31/2023 showing a right-sided pT1a pN2, stage IIIA invasive ductal carcinoma, grade 1, with negative margins  PLAN: Tailer's situation was discussed at this morning's multidisciplinary breast cancer conference. Because there are more than 3 lymph nodes involved she will not qualify for our Oncotype study and therefore the test was canceled. The standard of care for stage III patients, even if they are lobular  low-grade and estrogen receptor positive, is chemotherapy,  and this was the recommendation made today at conference.  The adjuvant! On line data base would quote her a risk of relapse of 38%, decreasing by 16% if all she does is aromatase inhibitors and bad additional 5% if she takes aggressive chemotherapy (for example TAC) or 2% if she uses CMF chemotherapy. However, risk of relapse data is misleading in this case, because it includes in breast recurrences and she had both breasts removed. Accordingly risk of death is more meaningful.  In this setting, the risk of death from breast cancer with surgery and radiation only is 20%. (The risk of death from other causes in the next 10 years is 18%). If she takes optimal antiestrogen therapy, that risk would drop by about 5-1/2%. Chemotherapy would lower the risk 3% if she uses a very aggressive regimen, or a little under 1% if she uses CMF.  We discussed all this at length, but I urged Kinzly not to make a definitive decision until she feels better. She is going to see me again in late November, by which time she will have recovered from her surgery, and will have had ample time to look of these numbers many times and particularly review them with her daughter who is a physician. Her first blush decision today however was that she would not want to put herself through chemotherapy for such a marginal benefit.  Because she is stage IIIa we are obtaining a CT of the chest and a bone scan before her return visit here in November. We discussed the need for radiation treatment today at the multidisciplinary breast cancer clinic and she will see Dr. Dayton Scrape the same day she sees me to operationalized that.  Gigi Gin is very upset because she feels she should have an MRI a long time ago when they first noted she had dense breasts. She wonders what would've happened if they had done an MRI year ago I really think he would have made her a little difference because her  cancer is very slow-growing and it likely has been there many years. She would like to share her experience with other people and is thinking of calling the newspaper to see if they would like to do an article.  She knows to call for any problems that may develop before her next visit here.    Lowella Dell, MD   09/12/2013 5:14 PM

## 2013-09-13 DIAGNOSIS — H543 Unqualified visual loss, both eyes: Secondary | ICD-10-CM | POA: Diagnosis not present

## 2013-09-13 DIAGNOSIS — D62 Acute posthemorrhagic anemia: Secondary | ICD-10-CM | POA: Diagnosis not present

## 2013-09-13 DIAGNOSIS — IMO0001 Reserved for inherently not codable concepts without codable children: Secondary | ICD-10-CM | POA: Diagnosis not present

## 2013-09-13 DIAGNOSIS — C50919 Malignant neoplasm of unspecified site of unspecified female breast: Secondary | ICD-10-CM | POA: Diagnosis not present

## 2013-09-13 DIAGNOSIS — Z483 Aftercare following surgery for neoplasm: Secondary | ICD-10-CM | POA: Diagnosis not present

## 2013-09-13 DIAGNOSIS — Z4801 Encounter for change or removal of surgical wound dressing: Secondary | ICD-10-CM | POA: Diagnosis not present

## 2013-09-14 ENCOUNTER — Other Ambulatory Visit: Payer: Self-pay | Admitting: Internal Medicine

## 2013-09-14 ENCOUNTER — Ambulatory Visit (INDEPENDENT_AMBULATORY_CARE_PROVIDER_SITE_OTHER): Payer: Medicare Other

## 2013-09-14 ENCOUNTER — Encounter: Payer: Self-pay | Admitting: Internal Medicine

## 2013-09-14 DIAGNOSIS — Z4802 Encounter for removal of sutures: Secondary | ICD-10-CM

## 2013-09-14 DIAGNOSIS — E039 Hypothyroidism, unspecified: Secondary | ICD-10-CM

## 2013-09-14 DIAGNOSIS — D62 Acute posthemorrhagic anemia: Secondary | ICD-10-CM

## 2013-09-14 MED ORDER — CITALOPRAM HYDROBROMIDE 20 MG PO TABS: 20.0000 mg | ORAL_TABLET | Freq: Every day | ORAL | Status: DC

## 2013-09-14 NOTE — Progress Notes (Signed)
Pt is here today to have her drains checked.  Right drain output is less than 30cc over last three days.  Drain was removed and a dry gauze dressing placed over opening.  Pt still has staples in left mastectomy wound.  Dr. Jamey Ripa consulted as to whether these could be removed today as well.  He suggested removing every other staple.  Dr. Donell Beers may remove the remainder of staples at pt's appt on 09/17/13.  Staples removed.  No steri strips were placed.  Pt does not want to wear her breast binder anymore because it is uncomfortable.  I told her it would be fine to leave it off.  Pt c/o some soreness in her right axilla which is most likely because lymph nodes were removed from that side.  Otherwise, she is doing well, and states she is feeling better.

## 2013-09-15 ENCOUNTER — Encounter: Payer: Self-pay | Admitting: Internal Medicine

## 2013-09-16 NOTE — Assessment & Plan Note (Addendum)
Hematoma stable.   One drain removed from right.  Follow up in 1.5 weeks to see if other drains can be removed.

## 2013-09-16 NOTE — Progress Notes (Signed)
HISTORY: Patient is status post bilateral mastectomies with right occipital lymph node biopsy followed by axillary lymph node dissection. She had to go back to the operating room on postop day 1 for hematoma. Pt has been slightly improving since her last visit.  She and her husband have gotten the drain care down.     EXAM: General:  Alert and oriented Incision:  No evidence of infection.  Drains with serosang drainage.  Sl smaller hematoma on left.     PATHOLOGY: 1. Lymph node, sentinel, biopsy, Right axillary #1 - ONE LYMPH NODE, POSITIVE FOR METASTATIC MAMMARY CARCINOMA (1/1) - TUMOR DEPOSIT IS 0.8 CM - FOCAL EXTRACAPSULAR TUMOR EXTENSION IDENTIFIED. 2. Lymph node, sentinel, biopsy, Right axilla - ONE LYMPH NODE, NEGATIVE FOR TUMOR (0/1). 3. Lymph node, sentinel, biopsy, Right axilla - ONE LYMPH NODE, POSITIVE FOR METASTATIC MAMMARY CARCINOMA (1/1). SEE COMMENT. - TUMOR DEPOSIT IS 1.5 CM . - NO EXTRACAPSULAR TUMOR EXTENSION IDENTIFIED. 4. Lymph node, sentinel, biopsy, Right axillary - ONE LYMPH NODE, POSITIVE FOR METASTATIC MAMMARY CARCINOMA (1/1). - TUMOR DEPOSIT IS 0.5 CM - NO EXTRACAPSULAR TUMOR EXTENSION IDENTIFIED. 5. Breast, simple mastectomy, Left - BENIGN BREAST TISSUE, SEE COMMENT. - CALCIFICATIONS IDENTIFIED. - NEGATIVE FOR ATYPIA OR MALIGNANCY. - ONE LYMPH NODE, NEGATIVE FOR TUMOR (0/1) - SURGICAL MARGINS, NEGATIVE FOR ATYPIA OR MALIGNANCY. 6. Breast, simple mastectomy, Right - INVASIVE LOBULAR CARCINOMA, SEE COMMENT. - NO LYMPHOVASCULAR INVASION IDENTIFIED. - SURGICAL MARGINS, NEGATIVE FOR TUMOR. - THREE LYMPH NODES, POSITIVE FOR METASTATIC MAMMARY CARCINOMA (3/3). - TUMOR DEPOSITS ARE 0.9 CM, 0.6 CM AND 2.3 CM. - EXTRACAPSULAR TUMOR EXTENSION IDENTIFIED. - SEE TUMOR SYNOPTIC TEMPLATE BELOW. 7. Lymph nodes, regional resection, Right axillary contents - THREE LYMPH NODE, POSITIVE FOR METASTATIC MAMMARY CARCINOMA (3/9) - TUMOR DEPOSITS ARE 0.8 CM, 0.3CM AND 0.8  CM. - FOCAL EXTRA CAPSULE TUMOR EXTENSION IDENTIFIED. - SEE COMMENT.   ASSESSMENT AND PLAN:   Breast cancer of lower-outer quadrant of right female breast Hematoma stable.   One drain removed from right.  Follow up in 1.5 weeks to see if other drains can be removed.         Maudry Diego, MD Surgical Oncology, General & Endocrine Surgery [facility]  Thomos Lemons, DO Artist Pais, Doe-Hyun R, DO

## 2013-09-17 ENCOUNTER — Telehealth: Payer: Self-pay | Admitting: Oncology

## 2013-09-17 ENCOUNTER — Ambulatory Visit (INDEPENDENT_AMBULATORY_CARE_PROVIDER_SITE_OTHER): Payer: Medicare Other | Admitting: General Surgery

## 2013-09-17 ENCOUNTER — Encounter (INDEPENDENT_AMBULATORY_CARE_PROVIDER_SITE_OTHER): Payer: Self-pay | Admitting: General Surgery

## 2013-09-17 ENCOUNTER — Other Ambulatory Visit: Payer: Self-pay | Admitting: *Deleted

## 2013-09-17 VITALS — BP 128/78 | HR 74 | Temp 97.2°F | Resp 16 | Ht 59.0 in | Wt 193.0 lb

## 2013-09-17 DIAGNOSIS — C50519 Malignant neoplasm of lower-outer quadrant of unspecified female breast: Secondary | ICD-10-CM

## 2013-09-17 DIAGNOSIS — C50511 Malignant neoplasm of lower-outer quadrant of right female breast: Secondary | ICD-10-CM

## 2013-09-17 MED ORDER — OXYCODONE-ACETAMINOPHEN 5-325 MG PO TABS: 1.0000 | ORAL_TABLET | ORAL | Status: DC | PRN

## 2013-09-17 NOTE — Telephone Encounter (Signed)
Per POF sch pt 11/26 4p w GM POF also wanted pt to see Dr Dayton Scrape same day Pt never seen him sw Clydie Braun Referral needed [facility] for [facility] to get a referral to Dr Dayton Scrape and to double check on new imaging orders as POF states yes and no new imaging orders there shh

## 2013-09-17 NOTE — Patient Instructions (Signed)
Start working on getting arm to 90 degrees by using wall to support arm.

## 2013-09-17 NOTE — Assessment & Plan Note (Signed)
Hematoma is resolving. The left sided drain is still putting out too much to pull.  I will see her back on Friday.  I advised her to work on getting her arm to 90 by using the wall for support.

## 2013-09-17 NOTE — Telephone Encounter (Signed)
tried to call pt no ans mailed nov cal after adding 11/26 appts shh

## 2013-09-17 NOTE — Progress Notes (Signed)
HISTORY: Patient is status post bilateral mastectomies with right sentinel lymph node biopsy followed by axillary lymph node dissection. She had to go back to the operating room on postop day 1 for hematoma. Patient has been continuing to improve her energy level. She is down to 3 pain pills per day. She did have the second right drain pulled last week.  She continues to have >20 ml/day except for last 24 hours.    EXAM: General:  Alert and oriented Incision:  No evidence of infection.  Drains with serosang drainage.  Sl smaller hematoma on left.     PATHOLOGY: 1. Lymph node, sentinel, biopsy, Right axillary #1 - ONE LYMPH NODE, POSITIVE FOR METASTATIC MAMMARY CARCINOMA (1/1) - TUMOR DEPOSIT IS 0.8 CM - FOCAL EXTRACAPSULAR TUMOR EXTENSION IDENTIFIED. 2. Lymph node, sentinel, biopsy, Right axilla - ONE LYMPH NODE, NEGATIVE FOR TUMOR (0/1). 3. Lymph node, sentinel, biopsy, Right axilla - ONE LYMPH NODE, POSITIVE FOR METASTATIC MAMMARY CARCINOMA (1/1). SEE COMMENT. - TUMOR DEPOSIT IS 1.5 CM . - NO EXTRACAPSULAR TUMOR EXTENSION IDENTIFIED. 4. Lymph node, sentinel, biopsy, Right axillary - ONE LYMPH NODE, POSITIVE FOR METASTATIC MAMMARY CARCINOMA (1/1). - TUMOR DEPOSIT IS 0.5 CM - NO EXTRACAPSULAR TUMOR EXTENSION IDENTIFIED. 5. Breast, simple mastectomy, Left - BENIGN BREAST TISSUE, SEE COMMENT. - CALCIFICATIONS IDENTIFIED. - NEGATIVE FOR ATYPIA OR MALIGNANCY. - ONE LYMPH NODE, NEGATIVE FOR TUMOR (0/1) - SURGICAL MARGINS, NEGATIVE FOR ATYPIA OR MALIGNANCY. 6. Breast, simple mastectomy, Right - INVASIVE LOBULAR CARCINOMA, SEE COMMENT. - NO LYMPHOVASCULAR INVASION IDENTIFIED. - SURGICAL MARGINS, NEGATIVE FOR TUMOR. - THREE LYMPH NODES, POSITIVE FOR METASTATIC MAMMARY CARCINOMA (3/3). - TUMOR DEPOSITS ARE 0.9 CM, 0.6 CM AND 2.3 CM. - EXTRACAPSULAR TUMOR EXTENSION IDENTIFIED. - SEE TUMOR SYNOPTIC TEMPLATE BELOW. 7. Lymph nodes, regional resection, Right axillary contents - THREE LYMPH  NODE, POSITIVE FOR METASTATIC MAMMARY CARCINOMA (3/9) - TUMOR DEPOSITS ARE 0.8 CM, 0.3CM AND 0.8 CM. - FOCAL EXTRA CAPSULE TUMOR EXTENSION IDENTIFIED. - SEE COMMENT.   ASSESSMENT AND PLAN:   Breast cancer of lower-outer quadrant of right female breast Hematoma is resolving. The left sided drain is still putting out too much to pull.  I will see her back on Friday.  I advised her to work on getting her arm to 90 by using the wall for support.       Maudry Diego, MD Surgical Oncology, General & Endocrine Surgery [facility]  Thomos Lemons, DO Artist Pais, Doe-Hyun R, DO

## 2013-09-18 DIAGNOSIS — Z483 Aftercare following surgery for neoplasm: Secondary | ICD-10-CM | POA: Diagnosis not present

## 2013-09-18 DIAGNOSIS — IMO0001 Reserved for inherently not codable concepts without codable children: Secondary | ICD-10-CM | POA: Diagnosis not present

## 2013-09-18 DIAGNOSIS — D62 Acute posthemorrhagic anemia: Secondary | ICD-10-CM | POA: Diagnosis not present

## 2013-09-18 DIAGNOSIS — C50919 Malignant neoplasm of unspecified site of unspecified female breast: Secondary | ICD-10-CM | POA: Diagnosis not present

## 2013-09-18 DIAGNOSIS — Z4801 Encounter for change or removal of surgical wound dressing: Secondary | ICD-10-CM | POA: Diagnosis not present

## 2013-09-18 DIAGNOSIS — H543 Unqualified visual loss, both eyes: Secondary | ICD-10-CM | POA: Diagnosis not present

## 2013-09-20 DIAGNOSIS — D62 Acute posthemorrhagic anemia: Secondary | ICD-10-CM | POA: Diagnosis not present

## 2013-09-20 DIAGNOSIS — Z4801 Encounter for change or removal of surgical wound dressing: Secondary | ICD-10-CM | POA: Diagnosis not present

## 2013-09-20 DIAGNOSIS — C50919 Malignant neoplasm of unspecified site of unspecified female breast: Secondary | ICD-10-CM | POA: Diagnosis not present

## 2013-09-20 DIAGNOSIS — H543 Unqualified visual loss, both eyes: Secondary | ICD-10-CM | POA: Diagnosis not present

## 2013-09-20 DIAGNOSIS — Z483 Aftercare following surgery for neoplasm: Secondary | ICD-10-CM | POA: Diagnosis not present

## 2013-09-20 DIAGNOSIS — IMO0001 Reserved for inherently not codable concepts without codable children: Secondary | ICD-10-CM | POA: Diagnosis not present

## 2013-09-21 ENCOUNTER — Other Ambulatory Visit (INDEPENDENT_AMBULATORY_CARE_PROVIDER_SITE_OTHER): Payer: Self-pay | Admitting: General Surgery

## 2013-09-21 ENCOUNTER — Ambulatory Visit (INDEPENDENT_AMBULATORY_CARE_PROVIDER_SITE_OTHER): Payer: Medicare Other | Admitting: General Surgery

## 2013-09-21 ENCOUNTER — Encounter (INDEPENDENT_AMBULATORY_CARE_PROVIDER_SITE_OTHER): Payer: Self-pay | Admitting: General Surgery

## 2013-09-21 VITALS — BP 112/84 | HR 68 | Temp 97.0°F | Resp 16 | Ht 59.0 in | Wt 193.2 lb

## 2013-09-21 DIAGNOSIS — C50911 Malignant neoplasm of unspecified site of right female breast: Secondary | ICD-10-CM

## 2013-09-21 DIAGNOSIS — C50519 Malignant neoplasm of lower-outer quadrant of unspecified female breast: Secondary | ICD-10-CM

## 2013-09-21 DIAGNOSIS — C50511 Malignant neoplasm of lower-outer quadrant of right female breast: Secondary | ICD-10-CM

## 2013-09-21 NOTE — Patient Instructions (Signed)
OK to shower today.  OK for short bath Monday.

## 2013-09-21 NOTE — Assessment & Plan Note (Signed)
Pt is doing much better.    Drains are all removed.  Follow up in 4 weeks.

## 2013-09-21 NOTE — Progress Notes (Signed)
HISTORY: Patient is status post bilateral mastectomies with right sentinel lymph node biopsy followed by axillary lymph node dissection. She had to go back to the operating room on postop day 1 for hematoma. Drain output has decreased significantly.  She is continuing to improve her energy level.    EXAM: General:  Alert and oriented Incision:  Hematoma improved on left.  Some sloughing of skin in center of right wound.      PATHOLOGY: 1. Lymph node, sentinel, biopsy, Right axillary #1 - ONE LYMPH NODE, POSITIVE FOR METASTATIC MAMMARY CARCINOMA (1/1) - TUMOR DEPOSIT IS 0.8 CM - FOCAL EXTRACAPSULAR TUMOR EXTENSION IDENTIFIED. 2. Lymph node, sentinel, biopsy, Right axilla - ONE LYMPH NODE, NEGATIVE FOR TUMOR (0/1). 3. Lymph node, sentinel, biopsy, Right axilla - ONE LYMPH NODE, POSITIVE FOR METASTATIC MAMMARY CARCINOMA (1/1). SEE COMMENT. - TUMOR DEPOSIT IS 1.5 CM . - NO EXTRACAPSULAR TUMOR EXTENSION IDENTIFIED. 4. Lymph node, sentinel, biopsy, Right axillary - ONE LYMPH NODE, POSITIVE FOR METASTATIC MAMMARY CARCINOMA (1/1). - TUMOR DEPOSIT IS 0.5 CM - NO EXTRACAPSULAR TUMOR EXTENSION IDENTIFIED. 5. Breast, simple mastectomy, Left - BENIGN BREAST TISSUE, SEE COMMENT. - CALCIFICATIONS IDENTIFIED. - NEGATIVE FOR ATYPIA OR MALIGNANCY. - ONE LYMPH NODE, NEGATIVE FOR TUMOR (0/1) - SURGICAL MARGINS, NEGATIVE FOR ATYPIA OR MALIGNANCY. 6. Breast, simple mastectomy, Right - INVASIVE LOBULAR CARCINOMA, SEE COMMENT. - NO LYMPHOVASCULAR INVASION IDENTIFIED. - SURGICAL MARGINS, NEGATIVE FOR TUMOR. - THREE LYMPH NODES, POSITIVE FOR METASTATIC MAMMARY CARCINOMA (3/3). - TUMOR DEPOSITS ARE 0.9 CM, 0.6 CM AND 2.3 CM. - EXTRACAPSULAR TUMOR EXTENSION IDENTIFIED. - SEE TUMOR SYNOPTIC TEMPLATE BELOW. 7. Lymph nodes, regional resection, Right axillary contents - THREE LYMPH NODE, POSITIVE FOR METASTATIC MAMMARY CARCINOMA (3/9) - TUMOR DEPOSITS ARE 0.8 CM, 0.3CM AND 0.8 CM. - FOCAL EXTRA CAPSULE  TUMOR EXTENSION IDENTIFIED. - SEE COMMENT.   ASSESSMENT AND PLAN:   Breast cancer of lower-outer quadrant of right female breast Pt is doing much better.    Drains are all removed.  Follow up in 4 weeks.       Maudry Diego, MD Surgical Oncology, General & Endocrine Surgery [facility]  Thomos Lemons, DO Artist Pais, Doe-Hyun R, DO

## 2013-09-26 ENCOUNTER — Ambulatory Visit: Payer: Medicare Other | Attending: General Surgery | Admitting: Physical Therapy

## 2013-09-26 DIAGNOSIS — C50919 Malignant neoplasm of unspecified site of unspecified female breast: Secondary | ICD-10-CM | POA: Insufficient documentation

## 2013-09-26 DIAGNOSIS — Z96659 Presence of unspecified artificial knee joint: Secondary | ICD-10-CM | POA: Insufficient documentation

## 2013-09-26 DIAGNOSIS — R293 Abnormal posture: Secondary | ICD-10-CM | POA: Diagnosis not present

## 2013-09-26 DIAGNOSIS — IMO0001 Reserved for inherently not codable concepts without codable children: Secondary | ICD-10-CM | POA: Diagnosis not present

## 2013-09-27 DIAGNOSIS — Z483 Aftercare following surgery for neoplasm: Secondary | ICD-10-CM | POA: Diagnosis not present

## 2013-09-27 DIAGNOSIS — IMO0001 Reserved for inherently not codable concepts without codable children: Secondary | ICD-10-CM | POA: Diagnosis not present

## 2013-09-27 DIAGNOSIS — D62 Acute posthemorrhagic anemia: Secondary | ICD-10-CM | POA: Diagnosis not present

## 2013-09-27 DIAGNOSIS — Z4801 Encounter for change or removal of surgical wound dressing: Secondary | ICD-10-CM | POA: Diagnosis not present

## 2013-09-27 DIAGNOSIS — C50919 Malignant neoplasm of unspecified site of unspecified female breast: Secondary | ICD-10-CM | POA: Diagnosis not present

## 2013-09-27 DIAGNOSIS — H543 Unqualified visual loss, both eyes: Secondary | ICD-10-CM | POA: Diagnosis not present

## 2013-09-28 ENCOUNTER — Encounter: Payer: Self-pay | Admitting: *Deleted

## 2013-10-01 ENCOUNTER — Encounter: Payer: Self-pay | Admitting: *Deleted

## 2013-10-01 ENCOUNTER — Ambulatory Visit (INDEPENDENT_AMBULATORY_CARE_PROVIDER_SITE_OTHER): Payer: Medicare Other | Admitting: Internal Medicine

## 2013-10-01 VITALS — BP 122/82 | Temp 97.5°F | Wt 186.0 lb

## 2013-10-01 DIAGNOSIS — R079 Chest pain, unspecified: Secondary | ICD-10-CM | POA: Diagnosis not present

## 2013-10-01 DIAGNOSIS — C50519 Malignant neoplasm of lower-outer quadrant of unspecified female breast: Secondary | ICD-10-CM | POA: Diagnosis not present

## 2013-10-01 DIAGNOSIS — D62 Acute posthemorrhagic anemia: Secondary | ICD-10-CM | POA: Diagnosis not present

## 2013-10-01 DIAGNOSIS — E039 Hypothyroidism, unspecified: Secondary | ICD-10-CM | POA: Diagnosis not present

## 2013-10-01 DIAGNOSIS — F4321 Adjustment disorder with depressed mood: Secondary | ICD-10-CM

## 2013-10-01 DIAGNOSIS — C50511 Malignant neoplasm of lower-outer quadrant of right female breast: Secondary | ICD-10-CM

## 2013-10-01 DIAGNOSIS — F329 Major depressive disorder, single episode, unspecified: Secondary | ICD-10-CM

## 2013-10-01 MED ORDER — TEMAZEPAM 7.5 MG PO CAPS: 7.5000 mg | ORAL_CAPSULE | Freq: Every evening | ORAL | Status: DC | PRN

## 2013-10-01 MED ORDER — LEVOTHYROXINE SODIUM 50 MCG PO TABS: 50.0000 ug | ORAL_TABLET | Freq: Every day | ORAL | Status: DC

## 2013-10-01 MED ORDER — SIMVASTATIN 10 MG PO TABS: 10.0000 mg | ORAL_TABLET | Freq: Every day | ORAL | Status: DC

## 2013-10-01 MED ORDER — PREGABALIN 75 MG PO CAPS: 75.0000 mg | ORAL_CAPSULE | Freq: Two times a day (BID) | ORAL | Status: DC

## 2013-10-01 MED ORDER — ESOMEPRAZOLE MAGNESIUM 40 MG PO CPDR: 40.0000 mg | DELAYED_RELEASE_CAPSULE | Freq: Every day | ORAL | Status: DC

## 2013-10-01 NOTE — Progress Notes (Signed)
Mailed after appt letter to pt. 

## 2013-10-01 NOTE — Assessment & Plan Note (Signed)
Citalopram increased to 10 mg. Her EKG shows mild bradycardia at 59 beats per minute. No QTC prolongation.

## 2013-10-01 NOTE — Assessment & Plan Note (Signed)
Followed by Dr. Aaron Edelman.  I suggest she see Dr. Maylon Cos geneticist.

## 2013-10-01 NOTE — Assessment & Plan Note (Signed)
Monitor CBCD.  Patient encouraged to increase intake of foods rich in iron.

## 2013-10-01 NOTE — Progress Notes (Signed)
Subjective:    [name], female    DOB: 19-Feb-1941, 72 y.o.   MRN: [id number]  HPI  72 year old white female with history of abnormal glucose, depression and hypothyroidism for followup. Interval medical history-patient had MRI of breasts after mammogram showed dense breast tissue. Patient found to have breast cancer in her right breast. She was seen by oncologist. She underwent bilateral mastectomy. She has right-sided T. T1 any she and 2, stage IIIa invasive ductal carcinoma, grade 1, with negative margins. She is estrogen receptor 90% positive, progesterone receptor 90% positive, with an MIB-1 of 12% and HER-2 negative.  Her mastectomies were complicated by postop bleeding. She was transfused 2 units. Patient felt fatigued but reports feeling better within the last several days.  She is planning to see a geneticist.  Depression-she increased her citalopram dose 20 mg once daily.  She is still having postop chest pain and is taking Percocet 5/325 twice daily. She denies any constipation.   Review of Systems Fatigue, negative for chest pain or shortness of breath    Past Medical History  Diagnosis Date  . Chest pain, atypical     Negative stress test 02/2009  . Other and unspecified hyperlipidemia   . Personal history of urinary calculi   . Pain in joint, shoulder region   . Irritable bowel syndrome   . Chronic insomnia   . GERD (gastroesophageal reflux disease)   . History of colonic polyps   . Fibromyalgia   . Hypothyroidism   . Other abnormal glucose   . Stress incontinence, female   . Macular degeneration   . Cluster headaches     history of migraines  . Meniscus tear     history of right knee torn meniscus  . Arthritis   . Kidney stone   . Cataract   . Breast cancer   . Depression   . Pneumonia 4098,1191  . Asthma     hx of -no inhalers, no problems  . H/O hiatal hernia     History   Social History  . Marital Status: Married    Spouse  Name: N/A    Number of Children: N/A  . Years of Education: N/A   Occupational History  . Not on file.   Social History Main Topics  . Smoking status: Former Games developer  . Smokeless tobacco: Never Used  . Alcohol Use: No  . Drug Use: No  . Sexual Activity: Not on file   Other Topics Concern  . Not on file   Social History Narrative   Occupation:  Retired Catering manager    Married with 3 grown children      Never Smoked     Alcohol use-no         Past Surgical History  Procedure Laterality Date  . Appendectomy    . Cholecystectomy    . Abdominal hysterectomy    . Tonsillectomy    . Eye surgery      to repair macular hole  . Cataract extraction, bilateral    . Knee arthroscopy      > 10 years ago  . Joint replacement  03/15/11    left knee replacement  . Colonoscopy    . Ganglion cyst excision      rt foot  . Foot arthroplasty      lt   . Mass excision  11/04/2011    Procedure: EXCISION MASS;  Surgeon: Wyn Forster., MD;  Location: [facility];  Service: Orthopedics;  Laterality: Right;  excisional biopsy right ulna mass  . Hemorrhoid surgery      03/1993  . Breast cyst aspiration      9 cysts  . Toe surgery      preventative crossover toe surg/right foot  . Toe surgery      left foot/screw  in 2nd toe  . Upper gastrointestinal endoscopy    . Skin tags removed      breast, panty line, neckline  . Bilateral total mastectomy with axillary lymph node dissection  08/30/2013    Dr Donell Beers  . Simple mastectomy with axillary sentinel node biopsy Left 08/30/2013    Procedure: Bilateral Breast Mastectomy ;  Surgeon: Almond Lint, MD;  Location: [facility] OR;  Service: General;  Laterality: Left;  Marland Kitchen Mastectomy w/ sentinel node biopsy Right 08/30/2013    Procedure: RIGHT  AXILLARY SENTINEL LYMPH NODE BIOPSY; Right Axillary Node Disection;  Surgeon: Almond Lint, MD;  Location: [facility] OR;  Service: General;  Laterality: Right;  Right side nuc med 7:00   . Evacuation  breast hematoma Left 08/31/2013    Procedure: EVACUATION HEMATOMA BREAST;  Surgeon: Almond Lint, MD;  Location: [facility] OR;  Service: General;  Laterality: Left;    Family History  Problem Relation Age of Onset  . Stroke Mother     died age 51  . Diabetes Mother   . Breast cancer Mother   . Breast cancer Sister   . Breast cancer Paternal Aunt   . Diabetes Maternal Grandfather   . Breast cancer Paternal Grandmother   . Breast cancer Paternal Aunt     No Known Allergies  Current Outpatient Prescriptions on File Prior to Visit  Medication Sig Dispense Refill  . Alcaftadine (LASTACAFT) 0.25 % SOLN Apply 1 drop to eye daily as needed (for eyes).      Marland Kitchen amoxicillin (AMOXIL) 500 MG capsule Take 4 capsules 1 hour before dental procedures  4 capsule  1  . butalbital-acetaminophen-caffeine (FIORICET WITH CODEINE) 50-325-40-30 MG per capsule Take 1 capsule by mouth every 4 (four) hours as needed.  30 capsule  1  . calcium-vitamin D (OSCAL WITH D) 500-200 MG-UNIT per tablet Take 1 tablet by mouth 2 (two) times daily.      . cetirizine (ZYRTEC) 10 MG tablet Take 10 mg by mouth daily.      . citalopram (CELEXA) 20 MG tablet Take 1 tablet (20 mg total) by mouth daily.  90 tablet  1  . diazepam (VALIUM) 5 MG tablet Take 1 tablet (5 mg total) by mouth every 6 (six) hours as needed.  30 tablet  3  . diclofenac sodium (VOLTAREN) 1 % GEL Apply 1 application topically as needed.      . docusate sodium (STOOL SOFTENER) 100 MG capsule Take 100 mg by mouth 3 (three) times daily as needed.       . LUTEIN PO Take 50 mg by mouth daily.      . meloxicam (MOBIC) 7.5 MG tablet Take 1 tablet (7.5 mg total) by mouth daily.  90 tablet  1  . metFORMIN (GLUCOPHAGE) 1000 MG tablet Take 1 tablet (1,000 mg total) by mouth 2 (two) times daily with a meal.  180 tablet  3  . mirabegron ER (MYRBETRIQ) 25 MG TB24 tablet Take 1 tablet (25 mg total) by mouth daily.  90 tablet  3  . oxyCODONE-acetaminophen (PERCOCET/ROXICET) 5-325  MG per tablet Take 1-2 tablets by mouth every 4 (four) hours as needed.  60 tablet  0  . polycarbophil (FIBERCON) 625 MG tablet Take 625 mg by mouth daily.      . Probiotic Product (ACIDOPHILUS PROBIOTIC BLEND) CAPS Take 1 capsule by mouth daily. 75mg  cap       No current facility-administered medications on file prior to visit.    BP 122/82  Temp(Src) 97.5 F (36.4 C) (Oral)  Wt 186 lb (84.369 kg)    Objective:   Physical Exam  Constitutional: She is oriented to person, place, and time. She appears well-developed and well-nourished.  HENT:  Head: Normocephalic and atraumatic.  Right Ear: External ear normal.  Left Ear: External ear normal.  Eyes: Conjunctivae are normal. Pupils are equal, round, and reactive to light.  Neck: Neck supple.  Cardiovascular: Normal rate, regular rhythm and normal heart sounds.   No murmur heard. Pulmonary/Chest: Effort normal and breath sounds normal. She has no wheezes.  Musculoskeletal: She exhibits no edema.  Lymphadenopathy:    She has no cervical adenopathy.  Neurological: She is alert and oriented to person, place, and time. No cranial nerve deficit.  Skin: Skin is warm and dry.  Psychiatric: She has a normal mood and affect. Her behavior is normal.          Assessment & Plan:

## 2013-10-01 NOTE — Patient Instructions (Signed)
Dr. Maylon Cos - Geneticist

## 2013-10-01 NOTE — Assessment & Plan Note (Signed)
Monitor TFTs

## 2013-10-02 LAB — CBC WITH DIFFERENTIAL/PLATELET
Basophils Absolute: 0 10*3/uL (ref 0.0–0.1)
Eosinophils Absolute: 0.5 10*3/uL (ref 0.0–0.7)
HCT: 34.9 % — ABNORMAL LOW (ref 36.0–46.0)
Hemoglobin: 11.5 g/dL — ABNORMAL LOW (ref 12.0–15.0)
Lymphs Abs: 2.4 10*3/uL (ref 0.7–4.0)
MCHC: 33 g/dL (ref 30.0–36.0)
MCV: 88 fl (ref 78.0–100.0)
Monocytes Absolute: 0.5 10*3/uL (ref 0.1–1.0)
Neutro Abs: 2.5 10*3/uL (ref 1.4–7.7)
RDW: 15.3 % — ABNORMAL HIGH (ref 11.5–14.6)

## 2013-10-03 ENCOUNTER — Ambulatory Visit: Payer: Medicare Other | Admitting: Physical Therapy

## 2013-10-03 DIAGNOSIS — Z96659 Presence of unspecified artificial knee joint: Secondary | ICD-10-CM | POA: Diagnosis not present

## 2013-10-03 DIAGNOSIS — R293 Abnormal posture: Secondary | ICD-10-CM | POA: Diagnosis not present

## 2013-10-03 DIAGNOSIS — IMO0001 Reserved for inherently not codable concepts without codable children: Secondary | ICD-10-CM | POA: Diagnosis not present

## 2013-10-03 DIAGNOSIS — C50919 Malignant neoplasm of unspecified site of unspecified female breast: Secondary | ICD-10-CM | POA: Diagnosis not present

## 2013-10-05 ENCOUNTER — Ambulatory Visit: Payer: Medicare Other | Admitting: Physical Therapy

## 2013-10-05 DIAGNOSIS — C50919 Malignant neoplasm of unspecified site of unspecified female breast: Secondary | ICD-10-CM | POA: Diagnosis not present

## 2013-10-05 DIAGNOSIS — IMO0001 Reserved for inherently not codable concepts without codable children: Secondary | ICD-10-CM | POA: Diagnosis not present

## 2013-10-05 DIAGNOSIS — R293 Abnormal posture: Secondary | ICD-10-CM | POA: Diagnosis not present

## 2013-10-05 DIAGNOSIS — Z96659 Presence of unspecified artificial knee joint: Secondary | ICD-10-CM | POA: Diagnosis not present

## 2013-10-08 ENCOUNTER — Other Ambulatory Visit (HOSPITAL_COMMUNITY): Payer: Medicare Other

## 2013-10-08 ENCOUNTER — Encounter (HOSPITAL_COMMUNITY)
Admission: RE | Admit: 2013-10-08 | Discharge: 2013-10-08 | Disposition: A | Discharge status: Home / Self Care | Payer: Medicare Other | Source: Ambulatory Visit | Attending: Oncology | Admitting: Oncology

## 2013-10-08 ENCOUNTER — Ambulatory Visit (HOSPITAL_COMMUNITY)
Admission: RE | Admit: 2013-10-08 | Discharge: 2013-10-08 | Disposition: A | Discharge status: Home / Self Care | Payer: Medicare Other | Source: Ambulatory Visit | Attending: Oncology | Admitting: Oncology

## 2013-10-08 DIAGNOSIS — I700 Atherosclerosis of aorta: Secondary | ICD-10-CM | POA: Insufficient documentation

## 2013-10-08 DIAGNOSIS — C50511 Malignant neoplasm of lower-outer quadrant of right female breast: Secondary | ICD-10-CM

## 2013-10-08 DIAGNOSIS — C50919 Malignant neoplasm of unspecified site of unspecified female breast: Secondary | ICD-10-CM | POA: Insufficient documentation

## 2013-10-08 DIAGNOSIS — J90 Pleural effusion, not elsewhere classified: Secondary | ICD-10-CM | POA: Diagnosis not present

## 2013-10-08 DIAGNOSIS — Z9010 Acquired absence of unspecified breast and nipple: Secondary | ICD-10-CM | POA: Diagnosis not present

## 2013-10-08 DIAGNOSIS — J984 Other disorders of lung: Secondary | ICD-10-CM | POA: Diagnosis not present

## 2013-10-08 MED ORDER — TECHNETIUM TC 99M MEDRONATE IV KIT
27.0000 | PACK | Freq: Once | INTRAVENOUS | Status: AC | PRN
  Administered 2013-10-08: 27 via INTRAVENOUS

## 2013-10-08 MED ORDER — IOHEXOL 300 MG/ML  SOLN
80.0000 mL | Freq: Once | INTRAMUSCULAR | Status: AC | PRN
  Administered 2013-10-08: 80 mL via INTRAVENOUS

## 2013-10-09 ENCOUNTER — Telehealth (INDEPENDENT_AMBULATORY_CARE_PROVIDER_SITE_OTHER): Payer: Self-pay | Admitting: *Deleted

## 2013-10-09 ENCOUNTER — Ambulatory Visit: Payer: Medicare Other | Admitting: Physical Therapy

## 2013-10-09 ENCOUNTER — Encounter: Payer: Self-pay | Admitting: Radiation Oncology

## 2013-10-09 ENCOUNTER — Other Ambulatory Visit (INDEPENDENT_AMBULATORY_CARE_PROVIDER_SITE_OTHER): Payer: Self-pay | Admitting: *Deleted

## 2013-10-09 ENCOUNTER — Other Ambulatory Visit: Payer: Self-pay | Admitting: Physician Assistant

## 2013-10-09 DIAGNOSIS — C50511 Malignant neoplasm of lower-outer quadrant of right female breast: Secondary | ICD-10-CM

## 2013-10-09 DIAGNOSIS — C50919 Malignant neoplasm of unspecified site of unspecified female breast: Secondary | ICD-10-CM | POA: Diagnosis not present

## 2013-10-09 DIAGNOSIS — R293 Abnormal posture: Secondary | ICD-10-CM | POA: Diagnosis not present

## 2013-10-09 DIAGNOSIS — IMO0001 Reserved for inherently not codable concepts without codable children: Secondary | ICD-10-CM | POA: Diagnosis not present

## 2013-10-09 DIAGNOSIS — Z96659 Presence of unspecified artificial knee joint: Secondary | ICD-10-CM | POA: Diagnosis not present

## 2013-10-09 MED ORDER — OXYCODONE-ACETAMINOPHEN 5-325 MG PO TABS: 1.0000 | ORAL_TABLET | ORAL | Status: DC | PRN

## 2013-10-09 NOTE — Telephone Encounter (Signed)
Patient walked into clinic today asking for a refill of pain medication.  Patient received a prescription for Percocet on 09/17/13 #60.  Patient's surgery was on 08/30/13 Bilateral mastectomy, then hematoma on 08/31/13.  Explained that we can send a message to Dr. Donell Beers to ask for her opinion on this then we can give her a call once we know.  Patient states understanding and agreeable at this time.

## 2013-10-09 NOTE — Telephone Encounter (Signed)
Spoke to Dr. Donell Beers in the OR who approved refill of Percocet #60.  Awaiting signature on prescription before calling patient.

## 2013-10-09 NOTE — Progress Notes (Signed)
Location of Breast Cancer: right LOQ  Histology per Pathology Report:  08/08/13 Breast, right, needle core biopsy, LOQ - INVASIVE AND IN SITU MAMMARY CARCINOMA.  08/30/13 Diagnosis 1. Lymph node, sentinel, biopsy, Right axillary #1 - ONE LYMPH NODE, POSITIVE FOR METASTATIC MAMMARY CARCINOMA (1/1) - TUMOR DEPOSIT IS 0.8 CM - FOCAL EXTRACAPSULAR TUMOR EXTENSION IDENTIFIED. 2. Lymph node, sentinel, biopsy, Right axilla - ONE LYMPH NODE, NEGATIVE FOR TUMOR (0/1). 3. Lymph node, sentinel, biopsy, Right axilla - ONE LYMPH NODE, POSITIVE FOR METASTATIC MAMMARY CARCINOMA (1/1). SEE COMMENT. - TUMOR DEPOSIT IS 1.5 CM . - NO EXTRACAPSULAR TUMOR EXTENSION IDENTIFIED. 4. Lymph node, sentinel, biopsy, Right axillary - ONE LYMPH NODE, POSITIVE FOR METASTATIC MAMMARY CARCINOMA (1/1). - TUMOR DEPOSIT IS 0.5 CM - NO EXTRACAPSULAR TUMOR EXTENSION IDENTIFIED. 5. Breast, simple mastectomy, Left - BENIGN BREAST TISSUE, SEE COMMENT. - CALCIFICATIONS IDENTIFIED. - NEGATIVE FOR ATYPIA OR MALIGNANCY. - ONE LYMPH NODE, NEGATIVE FOR TUMOR (0/1) - SURGICAL MARGINS, NEGATIVE FOR ATYPIA OR MALIGNANCY. 6. Breast, simple mastectomy, Right - INVASIVE LOBULAR CARCINOMA, SEE COMMENT. - NO LYMPHOVASCULAR INVASION IDENTIFIED. - SURGICAL MARGINS, NEGATIVE FOR TUMOR. - THREE LYMPH NODES, POSITIVE FOR METASTATIC MAMMARY CARCINOMA (3/3). - TUMOR DEPOSITS ARE 0.9 CM, 0.6 CM AND 2.3 CM. - EXTRACAPSULAR TUMOR EXTENSION IDENTIFIED. - SEE TUMOR SYNOPTIC TEMPLATE BELOW. 7. Lymph nodes, regional resection, Right axillary contents - THREE LYMPH NODE, POSITIVE FOR METASTATIC MAMMARY CARCINOMA (3/9) - TUMOR DEPOSITS ARE 0.8 CM, 0.3CM AND 0.8 CM. - FOCAL EXTRA CAPSULE TUMOR EXTENSION IDENTIFIED. - SEE COMMENT.  Receptor Status: ER(100%), PR (100%), Her2-neu (-)  Did patient present with symptoms (if so, please note symptoms) or was this found on screening mammography?: screening mammogram  Past/Anticipated interventions  by surgeon, if any: 08/30/13 bilateral simple mastectomies  Past/Anticipated interventions by medical oncology, if any: Chemotherapy : standard of care for stage III patients, even if they are lobular low-grade and estrogen receptor positive, is chemotherapy, and this was the recommendation made today at conference.  The adjuvant! On line data base would quote her a risk of relapse of 38%, decreasing by 16% if all she does is aromatase inhibitors and bad additional 5% if she takes aggressive chemotherapy (for example TAC) or 2% if she uses CMF chemotherapy. However, risk of relapse data is misleading in this case, because it includes in breast recurrences and she had both breasts removed. Accordingly risk of death is more meaningful.  In this setting, the risk of death from breast cancer with surgery and radiation only is 20%. (The risk of death from other causes in the next 10 years is 18%). If she takes optimal antiestrogen therapy, that risk would drop by about 5-1/2%. Chemotherapy would lower the risk 3% if she uses a very aggressive regimen, or a little under 1% if she uses CMF. Pt has FU w/Dr Magrinat 10/10/13, 4 pm.  Lymphedema issues, if any:  No, getting physical therapy twice a week to arms, shoulders  Pain issues, if any:  No, pt does have chronic arthritis pain, taking Hydrocodone BID since surgery.  SAFETY ISSUES:  Prior radiation? no  Pacemaker/ICD? no  Possible current pregnancy? no  Is the patient on methotrexate? no  Current Complaints / other details:  Married, 3 children 12 grandchildren, 2 great grandchildren, retired Conservator, museum/gallery  She is of Ashkenazi Jewish descent with extensive family history of breast cancer.    Glennie Hawk, RN 10/09/2013,1:37 PM

## 2013-10-09 NOTE — Telephone Encounter (Signed)
Prescription signed by Dr. Luisa Hart (urgent office MD).  Patient notified that prescription is at the front desk ready for pickup.

## 2013-10-10 ENCOUNTER — Ambulatory Visit (HOSPITAL_BASED_OUTPATIENT_CLINIC_OR_DEPARTMENT_OTHER): Payer: Medicare Other | Admitting: Oncology

## 2013-10-10 ENCOUNTER — Ambulatory Visit
Admission: RE | Admit: 2013-10-10 | Discharge: 2013-10-10 | Disposition: A | Discharge status: Home / Self Care | Payer: Medicare Other | Source: Ambulatory Visit | Attending: Radiation Oncology | Admitting: Radiation Oncology

## 2013-10-10 ENCOUNTER — Other Ambulatory Visit (HOSPITAL_BASED_OUTPATIENT_CLINIC_OR_DEPARTMENT_OTHER): Payer: Medicare Other

## 2013-10-10 ENCOUNTER — Encounter: Payer: Self-pay | Admitting: Radiation Oncology

## 2013-10-10 VITALS — BP 114/75 | HR 69 | Temp 98.1°F | Resp 18 | Ht 59.0 in | Wt 191.2 lb

## 2013-10-10 VITALS — BP 101/69 | HR 64 | Temp 98.6°F | Resp 20 | Wt 192.2 lb

## 2013-10-10 DIAGNOSIS — Z9010 Acquired absence of unspecified breast and nipple: Secondary | ICD-10-CM | POA: Insufficient documentation

## 2013-10-10 DIAGNOSIS — M899 Disorder of bone, unspecified: Secondary | ICD-10-CM

## 2013-10-10 DIAGNOSIS — C773 Secondary and unspecified malignant neoplasm of axilla and upper limb lymph nodes: Secondary | ICD-10-CM | POA: Insufficient documentation

## 2013-10-10 DIAGNOSIS — C50519 Malignant neoplasm of lower-outer quadrant of unspecified female breast: Secondary | ICD-10-CM | POA: Diagnosis not present

## 2013-10-10 DIAGNOSIS — C50511 Malignant neoplasm of lower-outer quadrant of right female breast: Secondary | ICD-10-CM

## 2013-10-10 DIAGNOSIS — Z170 Estrogen receptor positive status [ER+]: Secondary | ICD-10-CM | POA: Diagnosis not present

## 2013-10-10 DIAGNOSIS — C50919 Malignant neoplasm of unspecified site of unspecified female breast: Secondary | ICD-10-CM | POA: Insufficient documentation

## 2013-10-10 LAB — COMPREHENSIVE METABOLIC PANEL (CC13)
ALT: 21 U/L (ref 0–55)
AST: 19 U/L (ref 5–34)
Albumin: 3.7 g/dL (ref 3.5–5.0)
Alkaline Phosphatase: 70 U/L (ref 40–150)
Anion Gap: 12 mEq/L — ABNORMAL HIGH (ref 3–11)
BUN: 9.4 mg/dL (ref 7.0–26.0)
CO2: 27 mEq/L (ref 22–29)
Calcium: 9.1 mg/dL (ref 8.4–10.4)
Chloride: 103 mEq/L (ref 98–109)
Glucose: 98 mg/dl (ref 70–140)
Potassium: 3.9 mEq/L (ref 3.5–5.1)
Sodium: 143 mEq/L (ref 136–145)
Total Protein: 6.5 g/dL (ref 6.4–8.3)

## 2013-10-10 LAB — CBC WITH DIFFERENTIAL/PLATELET
BASO%: 0.7 % (ref 0.0–2.0)
Basophils Absolute: 0 10*3/uL (ref 0.0–0.1)
Eosinophils Absolute: 0.4 10*3/uL (ref 0.0–0.5)
HGB: 11 g/dL — ABNORMAL LOW (ref 11.6–15.9)
LYMPH%: 35 % (ref 14.0–49.7)
MCV: 87.3 fL (ref 79.5–101.0)
MONO#: 0.6 10*3/uL (ref 0.1–0.9)
MONO%: 10.6 % (ref 0.0–14.0)
NEUT#: 2.8 10*3/uL (ref 1.5–6.5)
Platelets: 235 10*3/uL (ref 145–400)
RBC: 3.98 10*6/uL (ref 3.70–5.45)
RDW: 14.6 % — ABNORMAL HIGH (ref 11.2–14.5)
WBC: 6 10*3/uL (ref 3.9–10.3)

## 2013-10-10 NOTE — Progress Notes (Signed)
Please see the Nurse Progress Note in the MD Initial Consult Encounter for this patient. 

## 2013-10-10 NOTE — Progress Notes (Addendum)
[facility] Radiation Oncology Follow up Note  Name: Jean Nash   Date:   10/10/2013 MRN:  [id number] DOB: [birth year]   CC:  Thomos Lemons, DO  Magrinat, Valentino Hue, MD, Dr. Almond Lint  DIAGNOSIS: Stage IIIA (T1 N2a MX) invasive lobular carcinoma of the right breast     ALLERGIES: Review of patient's allergies indicates no known allergies.   MEDICATIONS:  Current Outpatient Prescriptions  Medication Sig Dispense Refill  . Alcaftadine (LASTACAFT) 0.25 % SOLN Apply 1 drop to eye daily as needed (for eyes).      Marland Kitchen amoxicillin (AMOXIL) 500 MG capsule Take 4 capsules 1 hour before dental procedures  4 capsule  1  . butalbital-acetaminophen-caffeine (FIORICET WITH CODEINE) 50-325-40-30 MG per capsule Take 1 capsule by mouth every 4 (four) hours as needed.  30 capsule  1  . calcium-vitamin D (OSCAL WITH D) 500-200 MG-UNIT per tablet Take 1 tablet by mouth 2 (two) times daily.      . cetirizine (ZYRTEC) 10 MG tablet Take 10 mg by mouth daily.      . citalopram (CELEXA) 20 MG tablet Take 1 tablet (20 mg total) by mouth daily.  90 tablet  1  . diazepam (VALIUM) 5 MG tablet Take 1 tablet (5 mg total) by mouth every 6 (six) hours as needed.  30 tablet  3  . diclofenac sodium (VOLTAREN) 1 % GEL Apply 1 application topically as needed.      . docusate sodium (STOOL SOFTENER) 100 MG capsule Take 100 mg by mouth 3 (three) times daily as needed.       Marland Kitchen esomeprazole (NEXIUM) 40 MG capsule Take 1 capsule (40 mg total) by mouth daily.  90 capsule  3  . levothyroxine (SYNTHROID, LEVOTHROID) 50 MCG tablet Take 1 tablet (50 mcg total) by mouth daily.  90 tablet  3  . LUTEIN PO Take 50 mg by mouth daily.      . meloxicam (MOBIC) 7.5 MG tablet Take 1 tablet (7.5 mg total) by mouth daily.  90 tablet  1  . metFORMIN (GLUCOPHAGE) 1000 MG tablet Take 1 tablet (1,000 mg total) by mouth 2 (two) times daily with a meal.  180 tablet  3  . mirabegron ER (MYRBETRIQ) 25 MG TB24 tablet Take 1 tablet (25  mg total) by mouth daily.  90 tablet  3  . oxyCODONE-acetaminophen (PERCOCET/ROXICET) 5-325 MG per tablet Take 1-2 tablets by mouth every 4 (four) hours as needed (Every 4-6 hours PRN).  60 tablet  0  . polycarbophil (FIBERCON) 625 MG tablet Take 625 mg by mouth daily.      . pregabalin (LYRICA) 75 MG capsule Take 1 capsule (75 mg total) by mouth 2 (two) times daily.  180 capsule  1  . Probiotic Product (ACIDOPHILUS PROBIOTIC BLEND) CAPS Take 1 capsule by mouth daily. 75mg  cap      . simvastatin (ZOCOR) 10 MG tablet Take 1 tablet (10 mg total) by mouth at bedtime.  90 tablet  3  . temazepam (RESTORIL) 7.5 MG capsule Take 1 capsule (7.5 mg total) by mouth at bedtime as needed.  90 capsule  1   No current facility-administered medications for this encounter.     NARRATIVE: [name] is a pleasant 72 year old female who is seen today for consideration of post mastectomy radiation in the management of her T1 N2a MX invasive lobular carcinoma of the right breast .Mammography on 07/02/2013 was without evidence for malignancy. She is found to have a  heterogeneous dense breast, and she sought to have a MR scan. MR on 08/01/2013 showed an area of nodular and linear asymmetric enhancement in the lower outer quadrant of the right breast spanning 8.4 cm. There were no abnormal appearing lymph nodes. A biopsy on 08/08/2013 was diagnostic for invasive and in situ mammary carcinoma, 90% ER positive and 90% PR positive with a low Ki-67 of 5%. She indicated that she wanted bilateral mastectomies. She proceed with bilateral mastectomies with Dr. Donell Beers on August 30, 2013. On review of her pathology she was found to have a 0.5 cm invasive lobular carcinoma primary with 3 of 4 sentinel lymph nodes containing metastatic disease and 6 of 12 lymph nodes from the axillary lymph node section containing metastatic disease. Extracapsular extension was seen in numerous lymph nodes. On my count 9 of 16 lymph nodes contain  metastatic disease (path report reports only "6" lymph nodes with metastatic disease). She is meeting with Dr. Darnelle Catalan today for discussion of systemic treatment options in addition to antiestrogen therapy.    PHYSICAL EXAM:   weight is 192 lb 3.2 oz (87.181 kg). Her oral temperature is 98.6 F (37 C). Her blood pressure is 101/69 and her pulse is 64. Her respiration is 20.  Alert and oriented. Head and neck examination: Grossly unremarkable. Nodes: Without palpable cervical, supraclavicular, or axillary lymphadenopathy. Chest: Bilateral mastectomies, both wounds healing well. Lungs clear. Abdomen without hepatomegaly. Extremities: Without edema.   LABORATORY DATA:  Lab Results  Component Value Date   WBC 6.0 10/10/2013   HGB 11.0* 10/10/2013   HCT 34.7* 10/10/2013   MCV 87.3 10/10/2013   PLT 235 10/10/2013   Lab Results  Component Value Date   NA 143 10/10/2013   K 3.9 10/10/2013   CL 102 09/05/2013   CO2 27 10/10/2013   Lab Results  Component Value Date   ALT 21 10/10/2013   AST 19 10/10/2013   ALKPHOS 70 10/10/2013   BILITOT 0.33 10/10/2013      IMPRESSION: Pathologic stage IIIA (T1 N2a MX) invasive lobular carcinoma. Based on her nodal involvement, she would benefit from post mastectomy radiation therapy. Her risk factors for local regional recurrence include her extensive node positivity along with extracapsular extension. We discussed the potential acute and late toxicities of radiation therapy. She understands that if she has adjuvant chemotherapy, then radiation therapy will follow. Consent is signed today. Once she has made a decision regarding chemotherapy, we can schedule her radiation therapy at the appropriate time. I would like to start her radiation therapy within 2 weeks of her surgery (by mid December) so she should make a decision in the near future. I understand that she will sit down and speak with her daughter who is a physician. Lastly, I see that Dr.  Darnelle Catalan will perform a staging workup considering her stage III disease. I will check with Dr. Frederica Kuster of pathology to confirm the total number of lymph nodes involved.   PLAN: As discussed above.   I spent 50 minutes minutes face to face with the patient and more than 50% of that time was spent in counseling and/or coordination of care.

## 2013-10-10 NOTE — Progress Notes (Signed)
ID: [name] OB: 09-Feb-1941  MR#: [id number]  ONG#:[id number]  PCP: Thomos Lemons, DO GYN:   SU: Almond Lint OTHER MD: Chipper Herb, Talmage Coin, Scott MacDiarmid  CHIEF COMPLAINT: "I have breast cancer".  HISTORY OF PRESENT ILLNESS: Jean Nash had routine screening mammography age 72 2014 showing no suspicious findings. However she was found to have breast density category C. She research this, was alarmed by the fact that mammographic sensitivity is significantly decreased when the breasts are dense, and given her family history of breast cancer she opted for proceeding to bilateral breast MRI. This was performed at [facility] imaging 08/01/2013. It showed in the right breast an area of nodular and linear enhancement spanning approximately 8.4 cm. There were no abnormal appearing lymph nodes and the left breast was unremarkable.  Biopsy of an area around the middle of the abnormal section of the right breast on 08/08/2013, showed ([id number]) an invasive and in situ ductal carcinoma, the invasive tumor being grade 1, estrogen receptor 90% positive, progesterone receptor 90% positive, with an MIB-105%. HER-2 testing is pending.  The patient's subsequent history is as detailed below   INTERVAL HISTORY: Jean Nash returns today for followup of her breast cancer accompanied by her husband [name] . Since her last visit here she has gone over all the information we gave her, has discussed it with her family including her physician daughter, and has made it very clear decision that she does not want adjuvant chemotherapy. She also had her hair done today. "I'm back in business".  REVIEW OF SYSTEMS: She does not like the way of her mastectomy scars look and is still working on that with Dr. Donell Beers. She pointed out an area in her pathology report, which I will bring to your attention. (This does not affect her prognosis or treatment). She feels a bit tired, and has not yet started a walking program. She has  a good range of motion of both shoulders. She describes herself as mildly fatigued. She hurts "all over". She tells me she has arthritis and fibromyalgia and this is long-standing and not more intense or persistent than before. She sleeps poorly. She short of breath when she climbs stairs. She has significant stress incontinence and would be interested in discussing possible interventions with a urologist. She feels anxious but not depressed. A detailed review of systems today was otherwise noncontributory  PAST MEDICAL HISTORY: Past Medical History  Diagnosis Date  . Chest pain, atypical     Negative stress test 02/2009  . Other and unspecified hyperlipidemia   . Personal history of urinary calculi   . Pain in joint, shoulder region   . Irritable bowel syndrome   . Chronic insomnia   . GERD (gastroesophageal reflux disease)   . History of colonic polyps   . Fibromyalgia   . Hypothyroidism   . Other abnormal glucose   . Stress incontinence, female   . Macular degeneration   . Cluster headaches     history of migraines  . Meniscus tear     history of right knee torn meniscus  . Arthritis   . Kidney stone   . Cataract   . Depression   . Pneumonia 2536,6440  . Asthma     hx of -no inhalers, no problems  . H/O hiatal hernia   . Breast cancer 08/08/13    right LOQ    PAST SURGICAL HISTORY: Past Surgical History  Procedure Laterality Date  . Appendectomy    . Cholecystectomy    .  Abdominal hysterectomy    . Tonsillectomy    . Eye surgery      to repair macular hole  . Cataract extraction, bilateral    . Knee arthroscopy      > 10 years ago  . Joint replacement  03/15/11    left knee replacement  . Colonoscopy    . Ganglion cyst excision      rt foot  . Foot arthroplasty      lt   . Mass excision  11/04/2011    Procedure: EXCISION MASS;  Surgeon: Wyn Forster., MD;  Location: [facility];  Service: Orthopedics;  Laterality: Right;  excisional biopsy  right ulna mass  . Hemorrhoid surgery      03/1993  . Breast cyst aspiration      9 cysts  . Toe surgery      preventative crossover toe surg/right foot  . Toe surgery      left foot/screw  in 2nd toe  . Upper gastrointestinal endoscopy    . Skin tags removed      breast, panty line, neckline  . Bilateral total mastectomy with axillary lymph node dissection  08/30/2013    Dr Donell Beers  . Simple mastectomy with axillary sentinel node biopsy Left 08/30/2013    Procedure: Bilateral Breast Mastectomy ;  Surgeon: Almond Lint, MD;  Location: [facility] OR;  Service: General;  Laterality: Left;  Marland Kitchen Mastectomy w/ sentinel node biopsy Right 08/30/2013    Procedure: RIGHT  AXILLARY SENTINEL LYMPH NODE BIOPSY; Right Axillary Node Disection;  Surgeon: Almond Lint, MD;  Location: [facility] OR;  Service: General;  Laterality: Right;  Right side nuc med 7:00   . Evacuation breast hematoma Left 08/31/2013    Procedure: EVACUATION HEMATOMA BREAST;  Surgeon: Almond Lint, MD;  Location: [facility] OR;  Service: General;  Laterality: Left;    FAMILY HISTORY Family History  Problem Relation Age of Onset  . Stroke Mother     died age 21  . Diabetes Mother   . Breast cancer Mother 30  . Breast cancer Sister 60  . Breast cancer Paternal Aunt   . Diabetes Maternal Grandfather   . Breast cancer Paternal Grandmother 25  . Breast cancer Paternal Aunt   . Cancer Maternal Grandmother     intra-abdominal cancer   the patient's father lived to be 24. The patient's mother was diagnosed with breast cancer at age 6. She died at 82 from unrelated causes. The patient had no brothers, one sister there are multiple second degree relatives with breast cancer, but no history of ovarian cancer in the family.  GYNECOLOGIC HISTORY:  Menarche age 89, first live birth age 55, she is GX P3. She had a hysterectomy at age 61. She did not take hormone replacement. She took birth control perhaps for 2 years, in the mid-65s. She did not have any  complications from that.  SOCIAL HISTORY:  She is a retired Catering manager. Her husband [name] used to be a Engineer, building services. Daughter Cassanda Nash is a pediatrician in [place]. Daughter Louellen Molder is a Warden/ranger in [place]. Daughter Thereasa Parkin is  a therapist living in [place]. The patient has 12 grandchildren. She attends the local syn[employer]    ADVANCED DIRECTIVES: In place   HEALTH MAINTENANCE: History  Substance Use Topics  . Smoking status: Former Games developer  . Smokeless tobacco: Never Used  . Alcohol Use: No     Colonoscopy: April 2014 PAP: Status post hysterectomy  Bone  density: January 2014 Lipid panel:  No Known Allergies  Current Outpatient Prescriptions  Medication Sig Dispense Refill  . Alcaftadine (LASTACAFT) 0.25 % SOLN Apply 1 drop to eye daily as needed (for eyes).      Marland Kitchen amoxicillin (AMOXIL) 500 MG capsule Take 4 capsules 1 hour before dental procedures  4 capsule  1  . butalbital-acetaminophen-caffeine (FIORICET WITH CODEINE) 50-325-40-30 MG per capsule Take 1 capsule by mouth every 4 (four) hours as needed.  30 capsule  1  . calcium-vitamin D (OSCAL WITH D) 500-200 MG-UNIT per tablet Take 1 tablet by mouth 2 (two) times daily.      . cetirizine (ZYRTEC) 10 MG tablet Take 10 mg by mouth daily.      . citalopram (CELEXA) 20 MG tablet Take 1 tablet (20 mg total) by mouth daily.  90 tablet  1  . diazepam (VALIUM) 5 MG tablet Take 1 tablet (5 mg total) by mouth every 6 (six) hours as needed.  30 tablet  3  . diclofenac sodium (VOLTAREN) 1 % GEL Apply 1 application topically as needed.      . docusate sodium (STOOL SOFTENER) 100 MG capsule Take 100 mg by mouth 3 (three) times daily as needed.       Marland Kitchen esomeprazole (NEXIUM) 40 MG capsule Take 1 capsule (40 mg total) by mouth daily.  90 capsule  3  . levothyroxine (SYNTHROID, LEVOTHROID) 50 MCG tablet Take 1 tablet (50 mcg total) by mouth daily.  90 tablet  3  . LUTEIN PO Take 50 mg by mouth daily.      .  meloxicam (MOBIC) 7.5 MG tablet Take 1 tablet (7.5 mg total) by mouth daily.  90 tablet  1  . metFORMIN (GLUCOPHAGE) 1000 MG tablet Take 1 tablet (1,000 mg total) by mouth 2 (two) times daily with a meal.  180 tablet  3  . mirabegron ER (MYRBETRIQ) 25 MG TB24 tablet Take 1 tablet (25 mg total) by mouth daily.  90 tablet  3  . oxyCODONE-acetaminophen (PERCOCET/ROXICET) 5-325 MG per tablet Take 1-2 tablets by mouth every 4 (four) hours as needed (Every 4-6 hours PRN).  60 tablet  0  . polycarbophil (FIBERCON) 625 MG tablet Take 625 mg by mouth daily.      . pregabalin (LYRICA) 75 MG capsule Take 1 capsule (75 mg total) by mouth 2 (two) times daily.  180 capsule  1  . Probiotic Product (ACIDOPHILUS PROBIOTIC BLEND) CAPS Take 1 capsule by mouth daily. 75mg  cap      . simvastatin (ZOCOR) 10 MG tablet Take 1 tablet (10 mg total) by mouth at bedtime.  90 tablet  3  . temazepam (RESTORIL) 7.5 MG capsule Take 1 capsule (7.5 mg total) by mouth at bedtime as needed.  90 capsule  1   No current facility-administered medications for this visit.    OBJECTIVE: Middle-aged white woman in no acute distress Filed Vitals:   10/10/13 1547  BP: 114/75  Pulse: 69  Temp: 98.1 F (36.7 C)  Resp: 18     Body mass index is 38.6 kg/(m^2).    ECOG FS:1 - Symptomatic but completely ambulatory  Ocular: Sclerae unicteric, pupils equal, round and reactive to light Ear-nose-throat: Oropharynx clear, dentition poor Lymphatic: No cervical or supraclavicular adenopathy Lungs no rales or rhonchi, fair excursion bilaterally Heart regular rate and rhythm, no murmur appreciated Abd soft, obese, nontender, positive bowel sounds MSK mild kyphosis but no focal spinal tenderness, no upper extremity lymphedema Neuro: non-focal,  well-oriented, positive affect Breasts: Status post bilateral mastectomies. There is a 5 cm x 3 cm fluid collection in the left upper lateral chest. There is a "fat tag" in the right lateral wound area.  The incisions themselves are healing as expected, with no evidence of dehiscence or erythema. Both axillae are benign   LAB RESULTS:  CMP     Component Value Date/Time   NA 143 10/10/2013 1529   NA 137 09/05/2013 1523   K 3.9 10/10/2013 1529   K 4.2 09/05/2013 1523   CL 102 09/05/2013 1523   CO2 27 10/10/2013 1529   CO2 28 09/05/2013 1523   GLUCOSE 98 10/10/2013 1529   GLUCOSE 102* 09/05/2013 1523   BUN 9.4 10/10/2013 1529   BUN 10 09/05/2013 1523   CREATININE 0.7 10/10/2013 1529   CREATININE 0.87 09/05/2013 1523   CREATININE 0.48* 09/01/2013 0550   CALCIUM 9.1 10/10/2013 1529   CALCIUM 8.9 09/05/2013 1523   PROT 6.5 10/10/2013 1529   PROT 6.3 06/22/2013 1618   ALBUMIN 3.7 10/10/2013 1529   ALBUMIN 4.0 06/22/2013 1618   AST 19 10/10/2013 1529   AST 13 06/22/2013 1618   ALT 21 10/10/2013 1529   ALT 19 06/22/2013 1618   ALKPHOS 70 10/10/2013 1529   ALKPHOS 58 06/22/2013 1618   BILITOT 0.33 10/10/2013 1529   BILITOT 0.5 06/22/2013 1618   GFRNONAA >90 09/01/2013 0550   GFRAA >90 09/01/2013 0550    I No results found for this basename: SPEP,  UPEP,   kappa and lambda light chains    Lab Results  Component Value Date   WBC 6.0 10/10/2013   NEUTROABS 2.8 10/10/2013   HGB 11.0* 10/10/2013   HCT 34.7* 10/10/2013   MCV 87.3 10/10/2013   PLT 235 10/10/2013      Chemistry      Component Value Date/Time   NA 143 10/10/2013 1529   NA 137 09/05/2013 1523   K 3.9 10/10/2013 1529   K 4.2 09/05/2013 1523   CL 102 09/05/2013 1523   CO2 27 10/10/2013 1529   CO2 28 09/05/2013 1523   BUN 9.4 10/10/2013 1529   BUN 10 09/05/2013 1523   CREATININE 0.7 10/10/2013 1529   CREATININE 0.87 09/05/2013 1523   CREATININE 0.48* 09/01/2013 0550      Component Value Date/Time   CALCIUM 9.1 10/10/2013 1529   CALCIUM 8.9 09/05/2013 1523   ALKPHOS 70 10/10/2013 1529   ALKPHOS 58 06/22/2013 1618   AST 19 10/10/2013 1529   AST 13 06/22/2013 1618   ALT 21 10/10/2013 1529   ALT 19 06/22/2013 1618    BILITOT 0.33 10/10/2013 1529   BILITOT 0.5 06/22/2013 1618       No results found for this basename: LABCA2    No components found with this basename: LABCA125    No results found for this basename: INR,  in the last 168 hours  Urinalysis    Component Value Date/Time   COLORURINE YELLOW 09/05/2013 1525   APPEARANCEUR CLEAR 09/05/2013 1525   LABSPEC 1.010 09/05/2013 1525   PHURINE 6.0 09/05/2013 1525   GLUCOSEU NEG 09/05/2013 1525   HGBUR NEG 09/05/2013 1525   BILIRUBINUR NEG 09/05/2013 1525   BILIRUBINUR n 07/04/2012 1642   KETONESUR NEG 09/05/2013 1525   PROTEINUR NEG 09/05/2013 1525   UROBILINOGEN 0.2 09/05/2013 1525   UROBILINOGEN 0.2 07/04/2012 1642   NITRITE POS* 09/05/2013 1525   NITRITE n 07/04/2012 1642   LEUKOCYTESUR MOD* 09/05/2013 1525  STUDIES: Ct Chest W Contrast  10/08/2013   CLINICAL DATA:  Breast cancer diagnosed in 2014 with double mastectomy. No treatment. Pre radiation.  EXAM: CT CHEST WITH CONTRAST  TECHNIQUE: Multidetector CT imaging of the chest was performed during intravenous contrast administration.  CONTRAST:  80mL OMNIPAQUE IOHEXOL 300 MG/ML  SOLN  COMPARISON:  01/21/2009  FINDINGS: Lungs/Pleura: Apparent 6 mm nodular density within the lingula which is somewhat wedge-shaped on sagittal reformats. Transverse image 33 and sagittal image 90. Felt to be present on image 47 of the 2010 study.  No pleural fluid.  Heart/Mediastinum: No supraclavicular adenopathy. Status post right axillary node dissection. No axillary adenopathy. Small left-sided axillary and subpectoral nodes are identified, including on image 14/series 2.  Status post bilateral mastectomy. A fluid collection within the left chest wall measures 2.0 by 9.6 cm on image 26. Peripheral Mild enhancement.  Descending thoracic aortic tortuosity with atherosclerosis. Mild cardiomegaly. No central pulmonary embolism, on this non-dedicated study.  No mediastinal or hilar adenopathy.  No internal mammary  adenopathy.  Upper Abdomen: Atypical appearance to the inferior aspect of the left lobe of the liver, with an irregular hepatic capsule suggested on image 47/series 2. Scattered foci of subtle hepatic hyper enhancement, including on images 39 and 40 of series 2. Normal imaged portions of adrenal glands. Cholecystectomy. Incompletely imaged common duct, which measures upper normal, 11 mm.  Bones/Musculoskeletal: No acute osseous abnormality. Mild accentuation of expected thoracic kyphosis.  IMPRESSION: 1. Status post bilateral mastectomy and right axillary node dissection. No evidence of metastatic disease. A left chest wall fluid collection could represent a postoperative seroma or hematoma. 2. Probable scarring within the lingula. Recommend attention on follow-up. 3. Atypical appearance of the inferior left lobe of the liver. This could relate to atrophy or mild cirrhosis. Correlate with risk factors for the latter. 4. Probable perfusion anomalies within the left lobe of the liver.   Electronically Signed   By: Jeronimo Greaves M.D.   On: 10/08/2013 11:40   Nm Bone Scan Whole Body  10/08/2013   CLINICAL DATA:  Right breast cancer. Bilateral mastectomies. No bone pain.  EXAM: NUCLEAR MEDICINE WHOLE BODY BONE SCAN  TECHNIQUE: Whole body anterior and posterior images were obtained approximately 3 hours after intravenous injection of radiopharmaceutical.  COMPARISON:  Chest CT 10/08/2013  RADIOPHARMACEUTICALS:  27.0 mCi Technetium-99 MDP  FINDINGS: Expected uptake within the renal collecting system. Photopenia in the left knee probably represents a knee replacement. Probable degenerative changes in the right knee. No suspicious bone lesions. No abnormal uptake within the axial skeleton or ribs. Degenerative changes in both feet.  IMPRESSION: No evidence for bone metastatic disease.   Electronically Signed   By: Richarda Overlie M.D.   On: 10/08/2013 15:55    ASSESSMENT: 72 y.o. [place] woman status post right breast  lower outer quadrant biopsy 08/08/2013 for a clinical Te N0, stage IIA invasive ductal carcinoma, grade 1, estrogen receptor 90% positive, progesterone receptor 90% positive, with an MIB-1 of 12% and HER-2 negative  (1) status post bilateral mastectomies with right axillary lymph node dissection 08/31/2023 showing a right-sided pT1a pN2, stage IIIA invasive ductal carcinoma, grade 1, with negative margins  (2) given the marginal benefit in terms of mortality risk reduction from adjuvant chemotherapy, the patient decided to forego that option  (3) adjuvant radiation to start mid December  (4) antiestrogen therapy to start once radiation is completed  (5) osteopenia with a T score of -2.2 on bone density January 2014  PLAN: Jen has sought carefully about the possible benefits as well as the possible toxicities, side effects and complications of chemotherapy, and she is very clear in her own mind that she does not want to pursue that path. She understands NCCN guidelines do suggest chemotherapy for women in her situation, but given her fluoroscopy understanding and the fact that this indeed would be a very marginal benefit (between one and 3%) in terms of mortality reduction, I support her decision.  Accordingly her next up will be radiation. She met with Dr. Dayton Scrape today and I suspect she will be starting radiation sometime in December. This means you should be finishing late January, and I will see her around that time to discuss antiestrogen therapy.  She tells me she has a tendency to run high in serum testosterone. Certainly the aromatase inhibitors, by blocking estrogen production, can result in a mid increase in testosterone. For that reason we will think about tamoxifen to start with.  She is interested in discussing stress urinary incontinence with a urologist and I'm placing that referral. At this point she is not considering plastic intervention as far as her mastectomies are concerned.  She knows to call for any problems that may develop before her next visit here.    Marland Kitchen  Lowella Dell, MD   10/10/2013 4:25 PM

## 2013-10-15 ENCOUNTER — Telehealth: Payer: Self-pay | Admitting: Oncology

## 2013-10-15 NOTE — Telephone Encounter (Signed)
lmonvm for pt re appt for 12/06/13. also confirmed 12/18 appt. schedule mailed. waiting on response from desk nurse re referral for unrinary incontinence. per 12/1 pof pt to see Tawanna Cooler Mcdiarmid but pt is not a pt of his (family [facility]). ? may need to be seen @ [facility]

## 2013-10-17 ENCOUNTER — Ambulatory Visit: Payer: Medicare Other | Attending: General Surgery | Admitting: Physical Therapy

## 2013-10-17 DIAGNOSIS — R293 Abnormal posture: Secondary | ICD-10-CM | POA: Insufficient documentation

## 2013-10-17 DIAGNOSIS — IMO0001 Reserved for inherently not codable concepts without codable children: Secondary | ICD-10-CM | POA: Diagnosis not present

## 2013-10-17 DIAGNOSIS — C50919 Malignant neoplasm of unspecified site of unspecified female breast: Secondary | ICD-10-CM | POA: Insufficient documentation

## 2013-10-17 DIAGNOSIS — Z96659 Presence of unspecified artificial knee joint: Secondary | ICD-10-CM | POA: Insufficient documentation

## 2013-10-18 ENCOUNTER — Ambulatory Visit
Admission: RE | Admit: 2013-10-18 | Discharge: 2013-10-18 | Disposition: A | Discharge status: Home / Self Care | Payer: Medicare Other | Source: Ambulatory Visit | Attending: Radiation Oncology | Admitting: Radiation Oncology

## 2013-10-18 ENCOUNTER — Telehealth: Payer: Self-pay | Admitting: Oncology

## 2013-10-18 DIAGNOSIS — Z9010 Acquired absence of unspecified breast and nipple: Secondary | ICD-10-CM | POA: Insufficient documentation

## 2013-10-18 DIAGNOSIS — IMO0002 Reserved for concepts with insufficient information to code with codable children: Secondary | ICD-10-CM | POA: Insufficient documentation

## 2013-10-18 DIAGNOSIS — Z79899 Other long term (current) drug therapy: Secondary | ICD-10-CM | POA: Insufficient documentation

## 2013-10-18 DIAGNOSIS — R5381 Other malaise: Secondary | ICD-10-CM | POA: Insufficient documentation

## 2013-10-18 DIAGNOSIS — R0789 Other chest pain: Secondary | ICD-10-CM | POA: Diagnosis not present

## 2013-10-18 DIAGNOSIS — C50511 Malignant neoplasm of lower-outer quadrant of right female breast: Secondary | ICD-10-CM

## 2013-10-18 DIAGNOSIS — Z510 Encounter for antineoplastic radiation therapy: Secondary | ICD-10-CM | POA: Insufficient documentation

## 2013-10-18 DIAGNOSIS — C50519 Malignant neoplasm of lower-outer quadrant of unspecified female breast: Secondary | ICD-10-CM | POA: Diagnosis not present

## 2013-10-18 NOTE — Telephone Encounter (Signed)
s/w pt re appt for 12/11 @ 3pm w/Scott MacDiarmid @ [facility] - pt given appt d/t/location/#. also confirmed 12/06/13 appt w/GM. per desk nurse (amy m) whe she was at desk appt should be w/urologist not Tawanna Cooler Mcdiarmid as listed on 11/26 pof.

## 2013-10-18 NOTE — Progress Notes (Signed)
Simulation/treatment planning note: The patient was taken to the CT simulator. She was placed supine on a custom breast board. A custom neck mold was constructed for immobilization. Her right chest wall field borders were marked with a radiopaque wire along with her mastectomy scars. She was then scanned. I chose and isocenter between her supraclavicular field and tangential fields. The CT data set was sent to the planning system. Normal structures were contoured. She was set up to medial and lateral right chest wall tangents and 2 sets of multileaf collimators were designed. She was then set up to a right supraclavicular field, LAO with a separate multileaf collimator, and lastly a PA right axillary field with another multileaf collimator for a total of 5 complex treatment devices. I prescribing 5040 cGy in 28 sessions to her right chest wall with 1.0 cm and  custom bolus constructed and apply to her skin on the first day of her treatment and then utilized every other day. She will also receive 5040 cGy to the right supraclavicular region/axilla and a depth of 3 cm with 10 MV photons. The PA right axilla field will be used to supplement the mid axillary nodal bed to 4600 cGy in 28 sessions. Following completion of her regional node radiotherapy she will undergo a right mastectomy scar boost for a further 1000 cGy 5 sessions.

## 2013-10-19 ENCOUNTER — Ambulatory Visit: Payer: Medicare Other | Admitting: Physical Therapy

## 2013-10-22 DIAGNOSIS — R0789 Other chest pain: Secondary | ICD-10-CM | POA: Diagnosis not present

## 2013-10-22 DIAGNOSIS — Z9010 Acquired absence of unspecified breast and nipple: Secondary | ICD-10-CM | POA: Diagnosis not present

## 2013-10-22 DIAGNOSIS — R5381 Other malaise: Secondary | ICD-10-CM | POA: Diagnosis not present

## 2013-10-22 DIAGNOSIS — Z79899 Other long term (current) drug therapy: Secondary | ICD-10-CM | POA: Diagnosis not present

## 2013-10-22 DIAGNOSIS — Z510 Encounter for antineoplastic radiation therapy: Secondary | ICD-10-CM | POA: Diagnosis not present

## 2013-10-22 DIAGNOSIS — C50519 Malignant neoplasm of lower-outer quadrant of unspecified female breast: Secondary | ICD-10-CM | POA: Diagnosis not present

## 2013-10-22 DIAGNOSIS — IMO0002 Reserved for concepts with insufficient information to code with codable children: Secondary | ICD-10-CM | POA: Diagnosis not present

## 2013-10-24 ENCOUNTER — Ambulatory Visit: Payer: Medicare Other | Admitting: Physical Therapy

## 2013-10-25 ENCOUNTER — Ambulatory Visit
Admission: RE | Admit: 2013-10-25 | Discharge: 2013-10-25 | Disposition: A | Discharge status: Home / Self Care | Payer: Medicare Other | Source: Ambulatory Visit | Attending: Radiation Oncology | Admitting: Radiation Oncology

## 2013-10-25 DIAGNOSIS — C50519 Malignant neoplasm of lower-outer quadrant of unspecified female breast: Secondary | ICD-10-CM | POA: Diagnosis not present

## 2013-10-25 DIAGNOSIS — C50511 Malignant neoplasm of lower-outer quadrant of right female breast: Secondary | ICD-10-CM

## 2013-10-25 DIAGNOSIS — R0789 Other chest pain: Secondary | ICD-10-CM | POA: Diagnosis not present

## 2013-10-25 DIAGNOSIS — R339 Retention of urine, unspecified: Secondary | ICD-10-CM | POA: Diagnosis not present

## 2013-10-25 DIAGNOSIS — N3946 Mixed incontinence: Secondary | ICD-10-CM | POA: Diagnosis not present

## 2013-10-25 DIAGNOSIS — R5381 Other malaise: Secondary | ICD-10-CM | POA: Diagnosis not present

## 2013-10-25 DIAGNOSIS — Z79899 Other long term (current) drug therapy: Secondary | ICD-10-CM | POA: Diagnosis not present

## 2013-10-25 DIAGNOSIS — IMO0002 Reserved for concepts with insufficient information to code with codable children: Secondary | ICD-10-CM | POA: Diagnosis not present

## 2013-10-25 DIAGNOSIS — N816 Rectocele: Secondary | ICD-10-CM | POA: Diagnosis not present

## 2013-10-25 DIAGNOSIS — Z510 Encounter for antineoplastic radiation therapy: Secondary | ICD-10-CM | POA: Diagnosis not present

## 2013-10-25 DIAGNOSIS — Z9010 Acquired absence of unspecified breast and nipple: Secondary | ICD-10-CM | POA: Diagnosis not present

## 2013-10-25 NOTE — Progress Notes (Signed)
Simulation Verification Note: The patient underwent simulation verification for treatment to her right chestwall and regional LNs. Her isocenter is in good position and her MLCs contour the treatment volume appropriately.

## 2013-10-26 ENCOUNTER — Ambulatory Visit: Payer: Medicare Other | Admitting: Physical Therapy

## 2013-10-29 ENCOUNTER — Ambulatory Visit
Admission: RE | Admit: 2013-10-29 | Discharge: 2013-10-29 | Disposition: A | Discharge status: Home / Self Care | Payer: Medicare Other | Source: Ambulatory Visit | Attending: Radiation Oncology | Admitting: Radiation Oncology

## 2013-10-29 ENCOUNTER — Ambulatory Visit (INDEPENDENT_AMBULATORY_CARE_PROVIDER_SITE_OTHER): Payer: Medicare Other | Admitting: General Surgery

## 2013-10-29 ENCOUNTER — Encounter (INDEPENDENT_AMBULATORY_CARE_PROVIDER_SITE_OTHER): Payer: Self-pay | Admitting: General Surgery

## 2013-10-29 VITALS — BP 130/90 | HR 92 | Temp 99.1°F | Resp 15 | Ht 59.0 in | Wt 188.8 lb

## 2013-10-29 DIAGNOSIS — Z9010 Acquired absence of unspecified breast and nipple: Secondary | ICD-10-CM | POA: Diagnosis not present

## 2013-10-29 DIAGNOSIS — C50519 Malignant neoplasm of lower-outer quadrant of unspecified female breast: Secondary | ICD-10-CM | POA: Diagnosis not present

## 2013-10-29 DIAGNOSIS — R5381 Other malaise: Secondary | ICD-10-CM | POA: Diagnosis not present

## 2013-10-29 DIAGNOSIS — Z79899 Other long term (current) drug therapy: Secondary | ICD-10-CM | POA: Diagnosis not present

## 2013-10-29 DIAGNOSIS — IMO0002 Reserved for concepts with insufficient information to code with codable children: Secondary | ICD-10-CM | POA: Diagnosis not present

## 2013-10-29 DIAGNOSIS — Z510 Encounter for antineoplastic radiation therapy: Secondary | ICD-10-CM | POA: Diagnosis not present

## 2013-10-29 DIAGNOSIS — C50511 Malignant neoplasm of lower-outer quadrant of right female breast: Secondary | ICD-10-CM

## 2013-10-29 DIAGNOSIS — R0789 Other chest pain: Secondary | ICD-10-CM | POA: Diagnosis not present

## 2013-10-29 MED ORDER — RADIAPLEXRX EX GEL
Freq: Once | CUTANEOUS | Status: AC
  Administered 2013-10-29: 14:00:00 via TOPICAL

## 2013-10-29 MED ORDER — ALRA NON-METALLIC DEODORANT (RAD-ONC)
1.0000 "application " | Freq: Once | TOPICAL | Status: AC
  Administered 2013-10-29: 1 via TOPICAL

## 2013-10-29 NOTE — Patient Instructions (Signed)
Follow up in 3 months

## 2013-10-29 NOTE — Progress Notes (Signed)
HISTORY: Patient is doing better overall from her bilateral mastectomy with right axillary lymph node dissection. This was complicated by bilateral hematoma formation. She continues to have pain like a band around her chest. She states it just feels like she is still wearing a bra.  She's been going to physical therapy. She continues to have good mobility of her bilateral upper extremities.    EXAM: General:  Alert and oriented.   Incision:  Some redundant skin and fatty tissue in bilateral axillae.     ASSESSMENT AND PLAN:   Breast cancer of lower-outer quadrant of right female breast, s/p bilateral mastectomy, R ALND 08/2013 Patient continues to have some pain. I'm refilling her oxycodone. I have asked that she start trying to cut down to half a pill.  She is scheduled to start radiation therapy this week. I will see her back in 3 months to discuss scar revision.     She is not planning to get chemotherapy due to the low absolute benefit.  She will get antiestrogen therapy.       Maudry Diego, MD Surgical Oncology, General & Endocrine Surgery [facility]  Thomos Lemons, DO Artist Pais, Doe-Hyun R, DO

## 2013-10-29 NOTE — Progress Notes (Signed)
Weekly Management Note:  Site: Right chest wall/regional lymph nodes Current Dose:  180  cGy Projected Dose: 5040  cGy followed by right chest wall boost  Narrative: The patient is seen today for routine under treatment assessment. CBCT/MVCT images/port films were reviewed. The chart was reviewed.   No complaints today.  Physical Examination: There were no vitals filed for this visit..  Weight:  . No skin changes.  Impression: Tolerating radiation therapy well. I answered all of her questions to the best of my ability.  Plan: Continue radiation therapy as planned.

## 2013-10-29 NOTE — Assessment & Plan Note (Addendum)
Hematomas have resolved.    Patient continues to have some pain. I'm refilling her oxycodone. I have asked that she start trying to cut down to half a pill.  She is scheduled to start radiation therapy this week. I will see her back in 3 months to discuss scar revision.

## 2013-10-29 NOTE — Progress Notes (Addendum)
Post sim ed completed w/pt and husband. Gave pt "Radiation and You" booklet w/all pertinent information marked and discussed, re: fatigue, skin irritation/management, nutrition, pain. Pt states she has a pool and will swim every day for an hour next summer. Discussed need for total skin protection from sun following radiation treatments, need for minimum SPF 30. Pt adamant she will swim; reinforced education re: skin care, protection. Gave pt Radiaplex, ALra w/instructions for proper use. Pt and husband verbalized understanding.  Pt states her fibromyalgia pain is under better control today than last Friday. She states "it feels like I have a bra on all the time".  She wants to discuss her medications w/Dr Dayton Scrape stating she stopped almost all her medications when she was diagnosed. Pt questioning if her lung will receive radiation treatment; advised she discuss w/Dr Dayton Scrape today.

## 2013-10-30 ENCOUNTER — Ambulatory Visit
Admission: RE | Admit: 2013-10-30 | Discharge: 2013-10-30 | Disposition: A | Discharge status: Home / Self Care | Payer: Medicare Other | Source: Ambulatory Visit | Attending: Radiation Oncology | Admitting: Radiation Oncology

## 2013-10-30 DIAGNOSIS — R5381 Other malaise: Secondary | ICD-10-CM | POA: Diagnosis not present

## 2013-10-30 DIAGNOSIS — Z9010 Acquired absence of unspecified breast and nipple: Secondary | ICD-10-CM | POA: Diagnosis not present

## 2013-10-30 DIAGNOSIS — IMO0002 Reserved for concepts with insufficient information to code with codable children: Secondary | ICD-10-CM | POA: Diagnosis not present

## 2013-10-30 DIAGNOSIS — R0789 Other chest pain: Secondary | ICD-10-CM | POA: Diagnosis not present

## 2013-10-30 DIAGNOSIS — Z79899 Other long term (current) drug therapy: Secondary | ICD-10-CM | POA: Diagnosis not present

## 2013-10-30 DIAGNOSIS — Z510 Encounter for antineoplastic radiation therapy: Secondary | ICD-10-CM | POA: Diagnosis not present

## 2013-10-31 ENCOUNTER — Encounter: Payer: Medicare Other | Admitting: Physical Therapy

## 2013-10-31 ENCOUNTER — Ambulatory Visit
Admission: RE | Admit: 2013-10-31 | Discharge: 2013-10-31 | Disposition: A | Discharge status: Home / Self Care | Payer: Medicare Other | Source: Ambulatory Visit | Attending: Radiation Oncology | Admitting: Radiation Oncology

## 2013-10-31 DIAGNOSIS — Z510 Encounter for antineoplastic radiation therapy: Secondary | ICD-10-CM | POA: Diagnosis not present

## 2013-10-31 DIAGNOSIS — R5381 Other malaise: Secondary | ICD-10-CM | POA: Diagnosis not present

## 2013-10-31 DIAGNOSIS — Z79899 Other long term (current) drug therapy: Secondary | ICD-10-CM | POA: Diagnosis not present

## 2013-10-31 DIAGNOSIS — Z9010 Acquired absence of unspecified breast and nipple: Secondary | ICD-10-CM | POA: Diagnosis not present

## 2013-10-31 DIAGNOSIS — R0789 Other chest pain: Secondary | ICD-10-CM | POA: Diagnosis not present

## 2013-10-31 DIAGNOSIS — IMO0002 Reserved for concepts with insufficient information to code with codable children: Secondary | ICD-10-CM | POA: Diagnosis not present

## 2013-11-01 ENCOUNTER — Other Ambulatory Visit: Payer: Medicare Other

## 2013-11-01 ENCOUNTER — Ambulatory Visit: Payer: Self-pay | Admitting: Podiatry

## 2013-11-01 ENCOUNTER — Ambulatory Visit (HOSPITAL_BASED_OUTPATIENT_CLINIC_OR_DEPARTMENT_OTHER): Payer: Medicare Other | Admitting: Genetic Counselor

## 2013-11-01 ENCOUNTER — Ambulatory Visit
Admission: RE | Admit: 2013-11-01 | Discharge: 2013-11-01 | Disposition: A | Discharge status: Home / Self Care | Payer: Medicare Other | Source: Ambulatory Visit | Attending: Radiation Oncology | Admitting: Radiation Oncology

## 2013-11-01 ENCOUNTER — Encounter: Payer: Self-pay | Admitting: Genetic Counselor

## 2013-11-01 DIAGNOSIS — Z9010 Acquired absence of unspecified breast and nipple: Secondary | ICD-10-CM | POA: Diagnosis not present

## 2013-11-01 DIAGNOSIS — Z86010 Personal history of colonic polyps: Secondary | ICD-10-CM

## 2013-11-01 DIAGNOSIS — Z510 Encounter for antineoplastic radiation therapy: Secondary | ICD-10-CM | POA: Diagnosis not present

## 2013-11-01 DIAGNOSIS — IMO0002 Reserved for concepts with insufficient information to code with codable children: Secondary | ICD-10-CM | POA: Diagnosis not present

## 2013-11-01 DIAGNOSIS — Z803 Family history of malignant neoplasm of breast: Secondary | ICD-10-CM

## 2013-11-01 DIAGNOSIS — R0789 Other chest pain: Secondary | ICD-10-CM | POA: Diagnosis not present

## 2013-11-01 DIAGNOSIS — C50511 Malignant neoplasm of lower-outer quadrant of right female breast: Secondary | ICD-10-CM

## 2013-11-01 DIAGNOSIS — C50519 Malignant neoplasm of lower-outer quadrant of unspecified female breast: Secondary | ICD-10-CM

## 2013-11-01 DIAGNOSIS — R5381 Other malaise: Secondary | ICD-10-CM | POA: Diagnosis not present

## 2013-11-01 DIAGNOSIS — Z79899 Other long term (current) drug therapy: Secondary | ICD-10-CM | POA: Diagnosis not present

## 2013-11-01 NOTE — Progress Notes (Signed)
Dr.  Raymond Gurney Magrinat requested a consultation for genetic counseling and risk assessment for Jean Nash, a 72 y.o. female, for discussion of her personal history of breast cancer and family history of breast cancer and brain tumors.  She presents to clinic today to discuss the possibility of a genetic predisposition to cancer, and to further clarify her risks, as well as her family members' risks for cancer.   HISTORY OF PRESENT ILLNESS: In 2014, at the age of 3, Jean Nash was diagnosed with invasive ductal carcinoma of the breast. This was treated with double mastectomy, but she is not going through chemotherapy or radiation. The tumor is ER+/PR+/Her2-.  She has a history of colon polyps. She states that she has polyps with every colonoscopy.  There are two colonoscopies in EPIC that indicate a total of 4-5 polyps since 2009.    Past Medical History  Diagnosis Date  . Chest pain, atypical     Negative stress test 02/2009  . Other and unspecified hyperlipidemia   . Personal history of urinary calculi   . Pain in joint, shoulder region   . Irritable bowel syndrome   . Chronic insomnia   . GERD (gastroesophageal reflux disease)   . History of colonic polyps   . Fibromyalgia   . Hypothyroidism   . Other abnormal glucose   . Stress incontinence, female   . Macular degeneration   . Cluster headaches     history of migraines  . Meniscus tear     history of right knee torn meniscus  . Arthritis   . Kidney stone   . Cataract   . Depression   . Pneumonia 7829,5621  . Asthma     hx of -no inhalers, no problems  . H/O hiatal hernia   . Breast cancer 08/08/13    right LOQ    Past Surgical History  Procedure Laterality Date  . Appendectomy    . Cholecystectomy    . Abdominal hysterectomy    . Tonsillectomy    . Eye surgery      to repair macular hole  . Cataract extraction, bilateral    . Knee arthroscopy      > 10 years ago  . Joint replacement  03/15/11    left knee  replacement  . Colonoscopy    . Ganglion cyst excision      rt foot  . Foot arthroplasty      lt   . Mass excision  11/04/2011    Procedure: EXCISION MASS;  Surgeon: Wyn Forster., MD;  Location: [facility];  Service: Orthopedics;  Laterality: Right;  excisional biopsy right ulna mass  . Hemorrhoid surgery      03/1993  . Breast cyst aspiration      9 cysts  . Toe surgery      preventative crossover toe surg/right foot  . Toe surgery      left foot/screw  in 2nd toe  . Upper gastrointestinal endoscopy    . Skin tags removed      breast, panty line, neckline  . Bilateral total mastectomy with axillary lymph node dissection  08/30/2013    Dr Donell Beers  . Simple mastectomy with axillary sentinel node biopsy Left 08/30/2013    Procedure: Bilateral Breast Mastectomy ;  Surgeon: Almond Lint, MD;  Location: [facility] OR;  Service: General;  Laterality: Left;  Marland Kitchen Mastectomy w/ sentinel node biopsy Right 08/30/2013    Procedure: RIGHT  AXILLARY SENTINEL LYMPH NODE  BIOPSY; Right Axillary Node Disection;  Surgeon: Almond Lint, MD;  Location: [facility] OR;  Service: General;  Laterality: Right;  Right side nuc med 7:00   . Evacuation breast hematoma Left 08/31/2013    Procedure: EVACUATION HEMATOMA BREAST;  Surgeon: Almond Lint, MD;  Location: [facility] OR;  Service: General;  Laterality: Left;    History   Social History  . Marital Status: Married    Spouse Name: N/A    Number of Children: 3  . Years of Education: N/A   Social History Main Topics  . Smoking status: Former Games developer  . Smokeless tobacco: Never Used  . Alcohol Use: No  . Drug Use: No  . Sexual Activity: None     Comment: menarche age 67, fist live birth 49, P 3, hysterectomy age 10, no HRT, BCP 2 yrs   Other Topics Concern  . None   Social History Narrative   Occupation:  Retired Catering manager    Married with 3 grown children      Never Smoked     Alcohol use-no         REPRODUCTIVE HISTORY AND PERSONAL RISK  ASSESSMENT FACTORS: Menarche was at age 3.   postmenopausal Uterus Intact: no Ovaries Intact: yes G4P3A1, first live birth at age 41  She has not previously undergone treatment for infertility.   Oral Contraceptive use: 0 years   She has not used HRT in the past.    FAMILY HISTORY:  We obtained a detailed, 4-generation family history.  Significant diagnoses are listed below: Family History  Problem Relation Age of Onset  . Stroke Mother     died age 5  . Diabetes Mother   . Breast cancer Mother 51  . Breast cancer Sister 41  . Breast cancer Paternal Aunt 7  . Diabetes Maternal Grandfather   . Breast cancer Paternal Grandmother 24  . Breast cancer Paternal Aunt     dx in her 30s  . Cancer Maternal Grandmother     intra-abdominal cancer  . Brain cancer Maternal Uncle 8  . Brain cancer Cousin 39    maternal cousin  . Brain cancer Cousin 20    paternal cousin    Patient's maternal ancestors are of [place] descent, and paternal ancestors are of [place] descent. There is reported Ashkenazi Jewish ancestry. There is no known consanguinity.  GENETIC COUNSELING ASSESSMENT: [name] is a 72 y.o. female with a personal and family history of breast cancer, as well as a family history of "abdominal cancer" and brain tumors which somewhat suggestive of a hereditary cancer syndrome and predisposition to cancer. We, therefore, discussed and recommended the following at today's visit.   DISCUSSION: We reviewed the characteristics, features and inheritance patterns of hereditary cancer syndromes. We also discussed genetic testing, including the appropriate family members to test, the process of testing, insurance coverage and turn-around-time for results. We discussed the common BRCA mutations within the Ashkenazi Jewish population, and that we will do testing of these mutations and then pursue further testing after that. She wants the Breast/Ovarian cancer panel testing to be pursued,  even if her BRCA testing is positive, because of the cancer on both sides of her family.  PLAN: After considering the risks, benefits, and limitations, [name] provided informed consent to pursue genetic testing and the blood sample will be sent to [facility] for analysis of the Breast/Ovarian Cancer Panel. We discussed the implications of a positive, negative and/ or variant of uncertain significance genetic test  result. Results should be available within approximately 3 weeks' time, at which point they will be disclosed by telephone to [name], as will any additional recommendations warranted by these results. Bryleigh Ottaway will receive a summary of her genetic counseling visit and a copy of her results once available. This information will also be available in Epic. We encouraged Theressa Piedra to remain in contact with cancer genetics annually so that we can continuously update the family history and inform her of any changes in cancer genetics and testing that may be of benefit for her family. Donnald Garre questions were answered to her satisfaction today. Our contact information was provided should additional questions or concerns arise.  The patient was seen for a total of 60 minutes, greater than 50% of which was spent face-to-face counseling.  This note will also be sent to the referring provider via the electronic medical record. The patient will be supplied with a summary of this genetic counseling discussion as well as educational information on the discussed hereditary cancer syndromes following the conclusion of their visit.   Patient was discussed with Dr. Drue Second.   _______________________________________________________________________ For Office Staff:  Number of people involved in session: 2 Was an Intern/ student involved with case: no

## 2013-11-02 ENCOUNTER — Ambulatory Visit
Admission: RE | Admit: 2013-11-02 | Discharge: 2013-11-02 | Disposition: A | Discharge status: Home / Self Care | Payer: Medicare Other | Source: Ambulatory Visit | Attending: Radiation Oncology | Admitting: Radiation Oncology

## 2013-11-02 DIAGNOSIS — C50919 Malignant neoplasm of unspecified site of unspecified female breast: Secondary | ICD-10-CM | POA: Diagnosis not present

## 2013-11-02 DIAGNOSIS — Z79899 Other long term (current) drug therapy: Secondary | ICD-10-CM | POA: Diagnosis not present

## 2013-11-02 DIAGNOSIS — R0789 Other chest pain: Secondary | ICD-10-CM | POA: Diagnosis not present

## 2013-11-02 DIAGNOSIS — Z510 Encounter for antineoplastic radiation therapy: Secondary | ICD-10-CM | POA: Diagnosis not present

## 2013-11-02 DIAGNOSIS — R5381 Other malaise: Secondary | ICD-10-CM | POA: Diagnosis not present

## 2013-11-02 DIAGNOSIS — IMO0002 Reserved for concepts with insufficient information to code with codable children: Secondary | ICD-10-CM | POA: Diagnosis not present

## 2013-11-02 DIAGNOSIS — Z9010 Acquired absence of unspecified breast and nipple: Secondary | ICD-10-CM | POA: Diagnosis not present

## 2013-11-05 ENCOUNTER — Ambulatory Visit
Admission: RE | Admit: 2013-11-05 | Discharge: 2013-11-05 | Disposition: A | Discharge status: Home / Self Care | Payer: Medicare Other | Source: Ambulatory Visit | Attending: Radiation Oncology | Admitting: Radiation Oncology

## 2013-11-05 ENCOUNTER — Encounter: Payer: Self-pay | Admitting: Radiation Oncology

## 2013-11-05 VITALS — BP 119/77 | HR 79 | Resp 16 | Wt 188.5 lb

## 2013-11-05 DIAGNOSIS — C50919 Malignant neoplasm of unspecified site of unspecified female breast: Secondary | ICD-10-CM | POA: Diagnosis not present

## 2013-11-05 DIAGNOSIS — Z9010 Acquired absence of unspecified breast and nipple: Secondary | ICD-10-CM | POA: Diagnosis not present

## 2013-11-05 DIAGNOSIS — IMO0002 Reserved for concepts with insufficient information to code with codable children: Secondary | ICD-10-CM | POA: Diagnosis not present

## 2013-11-05 DIAGNOSIS — Z510 Encounter for antineoplastic radiation therapy: Secondary | ICD-10-CM | POA: Diagnosis not present

## 2013-11-05 DIAGNOSIS — Z79899 Other long term (current) drug therapy: Secondary | ICD-10-CM | POA: Diagnosis not present

## 2013-11-05 DIAGNOSIS — C50519 Malignant neoplasm of lower-outer quadrant of unspecified female breast: Secondary | ICD-10-CM | POA: Diagnosis not present

## 2013-11-05 DIAGNOSIS — R0789 Other chest pain: Secondary | ICD-10-CM | POA: Diagnosis not present

## 2013-11-05 DIAGNOSIS — R5381 Other malaise: Secondary | ICD-10-CM | POA: Diagnosis not present

## 2013-11-05 DIAGNOSIS — C50511 Malignant neoplasm of lower-outer quadrant of right female breast: Secondary | ICD-10-CM

## 2013-11-05 NOTE — Progress Notes (Signed)
Patient states, "i feel so much better today I am flying high." Denies pain at this time. Reports taking oxycodone tid. Denies constipation. Reports taking stool softener daily. Hyperpigmentation of right axilla noted. Reports using radiaplex as directed.

## 2013-11-05 NOTE — Progress Notes (Signed)
Weekly Management Note:  Site: Right chest wall/regional lymph nodes Current Dose:  1008  cGy Projected Dose: 5040  cGy  Narrative: The patient is seen today for routine under treatment assessment. CBCT/MVCT images/port films were reviewed. The chart was reviewed.   She is without complaints today. She is cutting back on her oxycodone as recommended by Dr. Donell Beers. She uses Radioplex gel.  Physical Examination:  Filed Vitals:   11/05/13 1016  BP: 119/77  Pulse: 79  Resp: 16  .  Weight: 188 lb 8 oz (85.503 kg). There is faint erythema the skin along the right chest wall. No areas of desquamation.  Impression: Tolerating radiation therapy well.  Plan: Continue radiation therapy as planned.

## 2013-11-06 ENCOUNTER — Encounter: Payer: Medicare Other | Admitting: Physical Therapy

## 2013-11-06 ENCOUNTER — Telehealth: Payer: Self-pay | Admitting: Genetic Counselor

## 2013-11-06 ENCOUNTER — Ambulatory Visit: Payer: Self-pay | Admitting: Podiatry

## 2013-11-06 ENCOUNTER — Ambulatory Visit
Admission: RE | Admit: 2013-11-06 | Discharge: 2013-11-06 | Disposition: A | Discharge status: Home / Self Care | Payer: Medicare Other | Source: Ambulatory Visit | Attending: Radiation Oncology | Admitting: Radiation Oncology

## 2013-11-06 DIAGNOSIS — R0789 Other chest pain: Secondary | ICD-10-CM | POA: Diagnosis not present

## 2013-11-06 DIAGNOSIS — Z510 Encounter for antineoplastic radiation therapy: Secondary | ICD-10-CM | POA: Diagnosis not present

## 2013-11-06 DIAGNOSIS — Z79899 Other long term (current) drug therapy: Secondary | ICD-10-CM | POA: Diagnosis not present

## 2013-11-06 DIAGNOSIS — R5381 Other malaise: Secondary | ICD-10-CM | POA: Diagnosis not present

## 2013-11-06 DIAGNOSIS — Z9010 Acquired absence of unspecified breast and nipple: Secondary | ICD-10-CM | POA: Diagnosis not present

## 2013-11-06 DIAGNOSIS — IMO0002 Reserved for concepts with insufficient information to code with codable children: Secondary | ICD-10-CM | POA: Diagnosis not present

## 2013-11-06 NOTE — Telephone Encounter (Signed)
Was negative for the 3 founder mutations in the Ashkenazi Jewish population.  The remainder of the panel is pending.

## 2013-11-06 NOTE — Telephone Encounter (Signed)
Left good news message on the first portion of her BRCA testing.  Asked that she call back.

## 2013-11-07 ENCOUNTER — Encounter: Payer: Self-pay | Admitting: Genetic Counselor

## 2013-11-07 ENCOUNTER — Ambulatory Visit
Admission: RE | Admit: 2013-11-07 | Discharge: 2013-11-07 | Disposition: A | Discharge status: Home / Self Care | Payer: Medicare Other | Source: Ambulatory Visit | Attending: Radiation Oncology | Admitting: Radiation Oncology

## 2013-11-07 DIAGNOSIS — R0789 Other chest pain: Secondary | ICD-10-CM | POA: Diagnosis not present

## 2013-11-07 DIAGNOSIS — Z510 Encounter for antineoplastic radiation therapy: Secondary | ICD-10-CM | POA: Diagnosis not present

## 2013-11-07 DIAGNOSIS — Z9010 Acquired absence of unspecified breast and nipple: Secondary | ICD-10-CM | POA: Diagnosis not present

## 2013-11-07 DIAGNOSIS — R5381 Other malaise: Secondary | ICD-10-CM | POA: Diagnosis not present

## 2013-11-07 DIAGNOSIS — Z79899 Other long term (current) drug therapy: Secondary | ICD-10-CM | POA: Diagnosis not present

## 2013-11-07 DIAGNOSIS — IMO0002 Reserved for concepts with insufficient information to code with codable children: Secondary | ICD-10-CM | POA: Diagnosis not present

## 2013-11-09 ENCOUNTER — Ambulatory Visit
Admission: RE | Admit: 2013-11-09 | Discharge: 2013-11-09 | Disposition: A | Discharge status: Home / Self Care | Payer: Medicare Other | Source: Ambulatory Visit | Attending: Radiation Oncology | Admitting: Radiation Oncology

## 2013-11-09 DIAGNOSIS — R0789 Other chest pain: Secondary | ICD-10-CM | POA: Diagnosis not present

## 2013-11-09 DIAGNOSIS — Z510 Encounter for antineoplastic radiation therapy: Secondary | ICD-10-CM | POA: Diagnosis not present

## 2013-11-09 DIAGNOSIS — R5381 Other malaise: Secondary | ICD-10-CM | POA: Diagnosis not present

## 2013-11-09 DIAGNOSIS — Z9010 Acquired absence of unspecified breast and nipple: Secondary | ICD-10-CM | POA: Diagnosis not present

## 2013-11-09 DIAGNOSIS — Z79899 Other long term (current) drug therapy: Secondary | ICD-10-CM | POA: Diagnosis not present

## 2013-11-09 DIAGNOSIS — IMO0002 Reserved for concepts with insufficient information to code with codable children: Secondary | ICD-10-CM | POA: Diagnosis not present

## 2013-11-12 ENCOUNTER — Ambulatory Visit
Admission: RE | Admit: 2013-11-12 | Discharge: 2013-11-12 | Disposition: A | Discharge status: Home / Self Care | Payer: Medicare Other | Source: Ambulatory Visit | Attending: Radiation Oncology | Admitting: Radiation Oncology

## 2013-11-12 DIAGNOSIS — Z79899 Other long term (current) drug therapy: Secondary | ICD-10-CM | POA: Diagnosis not present

## 2013-11-12 DIAGNOSIS — R0789 Other chest pain: Secondary | ICD-10-CM | POA: Diagnosis not present

## 2013-11-12 DIAGNOSIS — Z510 Encounter for antineoplastic radiation therapy: Secondary | ICD-10-CM | POA: Diagnosis not present

## 2013-11-12 DIAGNOSIS — R5381 Other malaise: Secondary | ICD-10-CM | POA: Diagnosis not present

## 2013-11-12 DIAGNOSIS — IMO0002 Reserved for concepts with insufficient information to code with codable children: Secondary | ICD-10-CM | POA: Diagnosis not present

## 2013-11-12 DIAGNOSIS — Z9010 Acquired absence of unspecified breast and nipple: Secondary | ICD-10-CM | POA: Diagnosis not present

## 2013-11-13 ENCOUNTER — Encounter: Payer: Medicare Other | Admitting: Physical Therapy

## 2013-11-13 ENCOUNTER — Ambulatory Visit
Admission: RE | Admit: 2013-11-13 | Discharge: 2013-11-13 | Disposition: A | Discharge status: Home / Self Care | Payer: Medicare Other | Source: Ambulatory Visit | Attending: Radiation Oncology | Admitting: Radiation Oncology

## 2013-11-13 ENCOUNTER — Encounter: Payer: Self-pay | Admitting: Radiation Oncology

## 2013-11-13 VITALS — BP 122/56 | HR 80 | Temp 97.7°F | Resp 20 | Wt 189.0 lb

## 2013-11-13 DIAGNOSIS — Z9010 Acquired absence of unspecified breast and nipple: Secondary | ICD-10-CM | POA: Diagnosis not present

## 2013-11-13 DIAGNOSIS — C50519 Malignant neoplasm of lower-outer quadrant of unspecified female breast: Secondary | ICD-10-CM | POA: Diagnosis not present

## 2013-11-13 DIAGNOSIS — IMO0002 Reserved for concepts with insufficient information to code with codable children: Secondary | ICD-10-CM | POA: Diagnosis not present

## 2013-11-13 DIAGNOSIS — Z510 Encounter for antineoplastic radiation therapy: Secondary | ICD-10-CM | POA: Diagnosis not present

## 2013-11-13 DIAGNOSIS — Z79899 Other long term (current) drug therapy: Secondary | ICD-10-CM | POA: Diagnosis not present

## 2013-11-13 DIAGNOSIS — C50511 Malignant neoplasm of lower-outer quadrant of right female breast: Secondary | ICD-10-CM

## 2013-11-13 DIAGNOSIS — R5381 Other malaise: Secondary | ICD-10-CM | POA: Diagnosis not present

## 2013-11-13 DIAGNOSIS — R0789 Other chest pain: Secondary | ICD-10-CM | POA: Diagnosis not present

## 2013-11-13 NOTE — Progress Notes (Signed)
   Department of Radiation Oncology  Phone:  [phone] Fax:        [phone]  Weekly Treatment Note    Name: Jean Nash Date: 11/13/2013 MRN: [id number] DOB: 04/19/1941   Current dose: 19.8 Gy  Current fraction: 11   MEDICATIONS: Current Outpatient Prescriptions  Medication Sig Dispense Refill  . amoxicillin (AMOXIL) 500 MG capsule Take 4 capsules 1 hour before dental procedures  4 capsule  1  . citalopram (CELEXA) 20 MG tablet Take 1 tablet (20 mg total) by mouth daily.  90 tablet  1  . docusate sodium (STOOL SOFTENER) 100 MG capsule Take 100 mg by mouth 3 (three) times daily as needed.       Marland Kitchen esomeprazole (NEXIUM) 40 MG capsule Take 1 capsule (40 mg total) by mouth daily.  90 capsule  3  . levothyroxine (SYNTHROID, LEVOTHROID) 50 MCG tablet Take 1 tablet (50 mcg total) by mouth daily.  90 tablet  3  . metFORMIN (GLUCOPHAGE) 1000 MG tablet Take 1 tablet (1,000 mg total) by mouth 2 (two) times daily with a meal.  180 tablet  3  . mirabegron ER (MYRBETRIQ) 25 MG TB24 tablet Take 1 tablet (25 mg total) by mouth daily.  90 tablet  3  . oxyCODONE-acetaminophen (PERCOCET/ROXICET) 5-325 MG per tablet Take 1-2 tablets by mouth every 4 (four) hours as needed (Every 4-6 hours PRN).  60 tablet  0  . pregabalin (LYRICA) 75 MG capsule Take 1 capsule (75 mg total) by mouth 2 (two) times daily.  180 capsule  1  . simvastatin (ZOCOR) 10 MG tablet Take 1 tablet (10 mg total) by mouth at bedtime.  90 tablet  3  . temazepam (RESTORIL) 7.5 MG capsule Take 1 capsule (7.5 mg total) by mouth at bedtime as needed.  90 capsule  1   No current facility-administered medications for this encounter.     ALLERGIES: Review of patient's allergies indicates no known allergies.   LABORATORY DATA:  Lab Results  Component Value Date   WBC 6.0 10/10/2013   HGB 11.0* 10/10/2013   HCT 34.7* 10/10/2013   MCV 87.3 10/10/2013   PLT 235 10/10/2013   Lab Results  Component Value Date   NA 143  10/10/2013   K 3.9 10/10/2013   CL 102 09/05/2013   CO2 27 10/10/2013   Lab Results  Component Value Date   ALT 21 10/10/2013   AST 19 10/10/2013   ALKPHOS 70 10/10/2013   BILITOT 0.33 10/10/2013     NARRATIVE: Jean Nash was seen today for weekly treatment management. The chart was checked and the patient's films were reviewed. The patient is doing well in terms of her treatment. No major changes in her scan. She states that she has just noticed a color change but no real irritation.  PHYSICAL EXAMINATION: weight is 189 lb (85.73 kg). Her oral temperature is 97.7 F (36.5 C). Her blood pressure is 122/56 and her pulse is 80. Her respiration is 20.      some fairly mild diffuse hyperpigmentation present in the treatment area. No bright erythema or desquamation.  ASSESSMENT: The patient is doing satisfactorily with treatment.  PLAN: We will continue with the patient's radiation treatment as planned.

## 2013-11-13 NOTE — Progress Notes (Signed)
Pt reports she has her chronic back pain, and states she has pain on her upper chest today, especially with pressure. She takes Oxycodone BID regularly. She is more fatigued. She is applying Radiaplex to right chest wall treatment area for some pinkness/darkening of skin.

## 2013-11-14 ENCOUNTER — Ambulatory Visit
Admission: RE | Admit: 2013-11-14 | Discharge: 2013-11-14 | Disposition: A | Discharge status: Home / Self Care | Payer: Medicare Other | Source: Ambulatory Visit | Attending: Radiation Oncology | Admitting: Radiation Oncology

## 2013-11-14 DIAGNOSIS — IMO0002 Reserved for concepts with insufficient information to code with codable children: Secondary | ICD-10-CM | POA: Diagnosis not present

## 2013-11-14 DIAGNOSIS — Z510 Encounter for antineoplastic radiation therapy: Secondary | ICD-10-CM | POA: Diagnosis not present

## 2013-11-14 DIAGNOSIS — R0789 Other chest pain: Secondary | ICD-10-CM | POA: Diagnosis not present

## 2013-11-14 DIAGNOSIS — R5381 Other malaise: Secondary | ICD-10-CM | POA: Diagnosis not present

## 2013-11-14 DIAGNOSIS — Z9010 Acquired absence of unspecified breast and nipple: Secondary | ICD-10-CM | POA: Diagnosis not present

## 2013-11-14 DIAGNOSIS — Z79899 Other long term (current) drug therapy: Secondary | ICD-10-CM | POA: Diagnosis not present

## 2013-11-16 ENCOUNTER — Ambulatory Visit
Admission: RE | Admit: 2013-11-16 | Discharge: 2013-11-16 | Disposition: A | Discharge status: Home / Self Care | Payer: Medicare Other | Source: Ambulatory Visit | Attending: Radiation Oncology | Admitting: Radiation Oncology

## 2013-11-16 ENCOUNTER — Encounter: Payer: Medicare Other | Admitting: Physical Therapy

## 2013-11-16 DIAGNOSIS — R5381 Other malaise: Secondary | ICD-10-CM | POA: Diagnosis not present

## 2013-11-16 DIAGNOSIS — R5383 Other fatigue: Secondary | ICD-10-CM | POA: Diagnosis not present

## 2013-11-16 DIAGNOSIS — R0789 Other chest pain: Secondary | ICD-10-CM | POA: Diagnosis not present

## 2013-11-16 DIAGNOSIS — Z79899 Other long term (current) drug therapy: Secondary | ICD-10-CM | POA: Diagnosis not present

## 2013-11-16 DIAGNOSIS — Z9010 Acquired absence of unspecified breast and nipple: Secondary | ICD-10-CM | POA: Diagnosis not present

## 2013-11-16 DIAGNOSIS — IMO0002 Reserved for concepts with insufficient information to code with codable children: Secondary | ICD-10-CM | POA: Diagnosis not present

## 2013-11-16 DIAGNOSIS — Z510 Encounter for antineoplastic radiation therapy: Secondary | ICD-10-CM | POA: Diagnosis not present

## 2013-11-19 ENCOUNTER — Ambulatory Visit
Admission: RE | Admit: 2013-11-19 | Discharge: 2013-11-19 | Disposition: A | Discharge status: Home / Self Care | Payer: Medicare Other | Source: Ambulatory Visit | Attending: Radiation Oncology | Admitting: Radiation Oncology

## 2013-11-19 VITALS — BP 123/58 | HR 69 | Temp 98.3°F | Wt 190.7 lb

## 2013-11-19 DIAGNOSIS — C50511 Malignant neoplasm of lower-outer quadrant of right female breast: Secondary | ICD-10-CM

## 2013-11-19 NOTE — Progress Notes (Signed)
Weekly Management Note:  Site: Right chest wall/regional lymph nodes Current Dose:  2520  cGy Projected Dose: 5040  cGy followed by right chest wall boost  Narrative: The patient is seen today for routine under treatment assessment. CBCT/MVCT images/port films were reviewed. The chart was reviewed.   She continues to have bilateral chest discomfort for which she takes up to 3 oxycodone tablets a day, prescribed to Dr. Barry Dienes. She plans to resume her Mobic and helps to come off oxycodone. She is on Lyrica. She uses Radioplex gel.  Physical Examination:  Filed Vitals:   11/19/13 1112  BP: 123/58  Pulse: 69  Temp: 98.3 F (36.8 C)  .  Weight: 190 lb 11.2 oz (86.501 kg). There is mild erythema the skin along the right chest wall/axilla/clavicular region. No areas of desquamation.  Impression: Tolerating radiation therapy well. She appears to have neuropathic pain along her chest wall, presumably related to surgery.  Plan: Continue radiation therapy as planned.

## 2013-11-19 NOTE — Progress Notes (Signed)
Patient for weekly assessment of radiation to right chestwall and supraclavicular region.Skin pink.Constant discomfort around bra line bilaterally,Takes oxycodone.thought she couldn't take mobic any longer.Informed she may resume daily mobic for fibrlomyalgia and arthritis,  and may not need oxycodone as much.Continue application of radiaplex twice daily.

## 2013-11-20 ENCOUNTER — Encounter: Payer: Self-pay | Admitting: Genetic Counselor

## 2013-11-20 ENCOUNTER — Ambulatory Visit
Admission: RE | Admit: 2013-11-20 | Discharge: 2013-11-20 | Disposition: A | Discharge status: Home / Self Care | Payer: Medicare Other | Source: Ambulatory Visit | Attending: Radiation Oncology | Admitting: Radiation Oncology

## 2013-11-20 ENCOUNTER — Ambulatory Visit (INDEPENDENT_AMBULATORY_CARE_PROVIDER_SITE_OTHER): Payer: Medicare Other | Admitting: Podiatry

## 2013-11-20 ENCOUNTER — Encounter: Payer: Self-pay | Admitting: Podiatry

## 2013-11-20 ENCOUNTER — Telehealth: Payer: Self-pay | Admitting: Genetic Counselor

## 2013-11-20 VITALS — BP 131/67 | HR 69 | Resp 16

## 2013-11-20 DIAGNOSIS — B351 Tinea unguium: Secondary | ICD-10-CM | POA: Diagnosis not present

## 2013-11-20 DIAGNOSIS — M79609 Pain in unspecified limb: Secondary | ICD-10-CM

## 2013-11-20 NOTE — Progress Notes (Signed)
She presents today for routine nail debridement. Toenails are painful.  Objective: Vital signs are stable she is alert and oriented x3. Nails are thick yellow dystrophic clinically mycotic sharply incurvated and painful.  Assessment: Pain in limb secondary to onychomycosis.  Plan: Debridement of nails in thickness and length as cover service secondary to pain followup with her in 3 months.  Remember to ask her how her radiation treatments are going.

## 2013-11-20 NOTE — Telephone Encounter (Signed)
Revealed negative genetic testing but that she has a CHEK2 VUS.

## 2013-11-21 ENCOUNTER — Ambulatory Visit
Admission: RE | Admit: 2013-11-21 | Discharge: 2013-11-21 | Disposition: A | Discharge status: Home / Self Care | Payer: Medicare Other | Source: Ambulatory Visit | Attending: Radiation Oncology | Admitting: Radiation Oncology

## 2013-11-21 DIAGNOSIS — C50519 Malignant neoplasm of lower-outer quadrant of unspecified female breast: Secondary | ICD-10-CM | POA: Diagnosis not present

## 2013-11-22 ENCOUNTER — Ambulatory Visit
Admission: RE | Admit: 2013-11-22 | Discharge: 2013-11-22 | Disposition: A | Discharge status: Home / Self Care | Payer: Medicare Other | Source: Ambulatory Visit | Attending: Radiation Oncology | Admitting: Radiation Oncology

## 2013-11-23 ENCOUNTER — Ambulatory Visit
Admission: RE | Admit: 2013-11-23 | Discharge: 2013-11-23 | Disposition: A | Discharge status: Home / Self Care | Payer: Medicare Other | Source: Ambulatory Visit | Attending: Radiation Oncology | Admitting: Radiation Oncology

## 2013-11-26 ENCOUNTER — Ambulatory Visit
Admission: RE | Admit: 2013-11-26 | Discharge: 2013-11-26 | Disposition: A | Discharge status: Home / Self Care | Payer: Medicare Other | Source: Ambulatory Visit | Attending: Radiation Oncology | Admitting: Radiation Oncology

## 2013-11-26 ENCOUNTER — Encounter: Payer: Self-pay | Admitting: Radiation Oncology

## 2013-11-26 VITALS — BP 113/78 | HR 79 | Temp 97.6°F | Resp 20 | Wt 194.2 lb

## 2013-11-26 DIAGNOSIS — C50511 Malignant neoplasm of lower-outer quadrant of right female breast: Secondary | ICD-10-CM

## 2013-11-26 NOTE — Progress Notes (Signed)
Weekly rad txs, rt cw  Slight erythema skin intact, but c/o pain 3 on 1-10 scale, it just hurts, when I press it if=t feels like a lump thre" right side cw, appetite good, fatigued 10:48 AM

## 2013-11-26 NOTE — Progress Notes (Signed)
Weekly Management Note:  Site: Right chest wall/regional lymph nodes Current Dose:  3420  cGy Projected Dose: 5040  CGy followed by right chest wall is to be scar boost  Narrative: The patient is seen today for routine under treatment assessment. CBCT/MVCT images/port films were reviewed. The chart was reviewed.   Her biggest complaint is that of fatigue. She is otherwise doing well. She uses Radioplex gel.  Physical Examination:  Filed Vitals:   11/26/13 1042  BP: 113/78  Pulse: 79  Temp: 97.6 F (36.4 C)  Resp: 20  .  Weight: 194 lb 3.2 oz (88.089 kg). There is mild hyperpigmentation/erythema along the right chest wall/axilla. No areas of desquamation.  Impression: Tolerating radiation therapy well.  Plan: Continue radiation therapy as planned.

## 2013-11-27 ENCOUNTER — Ambulatory Visit
Admission: RE | Admit: 2013-11-27 | Discharge: 2013-11-27 | Disposition: A | Discharge status: Home / Self Care | Payer: Medicare Other | Source: Ambulatory Visit | Attending: Radiation Oncology | Admitting: Radiation Oncology

## 2013-11-28 ENCOUNTER — Ambulatory Visit
Admission: RE | Admit: 2013-11-28 | Discharge: 2013-11-28 | Disposition: A | Discharge status: Home / Self Care | Payer: Medicare Other | Source: Ambulatory Visit | Attending: Radiation Oncology | Admitting: Radiation Oncology

## 2013-11-28 DIAGNOSIS — C50519 Malignant neoplasm of lower-outer quadrant of unspecified female breast: Secondary | ICD-10-CM | POA: Diagnosis not present

## 2013-11-29 ENCOUNTER — Ambulatory Visit
Admission: RE | Admit: 2013-11-29 | Discharge: 2013-11-29 | Disposition: A | Discharge status: Home / Self Care | Payer: Medicare Other | Source: Ambulatory Visit | Attending: Radiation Oncology | Admitting: Radiation Oncology

## 2013-11-30 ENCOUNTER — Ambulatory Visit
Admission: RE | Admit: 2013-11-30 | Discharge: 2013-11-30 | Disposition: A | Discharge status: Home / Self Care | Payer: Medicare Other | Source: Ambulatory Visit | Attending: Radiation Oncology | Admitting: Radiation Oncology

## 2013-12-03 ENCOUNTER — Ambulatory Visit
Admission: RE | Admit: 2013-12-03 | Discharge: 2013-12-03 | Disposition: A | Discharge status: Home / Self Care | Payer: Medicare Other | Source: Ambulatory Visit | Attending: Radiation Oncology | Admitting: Radiation Oncology

## 2013-12-03 ENCOUNTER — Encounter: Payer: Self-pay | Admitting: Radiation Oncology

## 2013-12-03 VITALS — BP 117/75 | HR 59 | Temp 97.4°F | Resp 20 | Wt 197.9 lb

## 2013-12-03 DIAGNOSIS — C50511 Malignant neoplasm of lower-outer quadrant of right female breast: Secondary | ICD-10-CM

## 2013-12-03 DIAGNOSIS — C50519 Malignant neoplasm of lower-outer quadrant of unspecified female breast: Secondary | ICD-10-CM | POA: Diagnosis not present

## 2013-12-03 NOTE — Progress Notes (Signed)
Complex simulation: The patient was taken to the [facility] for electron beam simulation. She was set up RAO to her right chest wall to encompass her right chest wall scars. One custom block is constructed to conform the field. I am prescribing 1000 cGy in 5 sessions utilizing 6 MEV electrons. 0.8 cm custom bolus will be constructed in applied to her skin on the first day of her treatment. A special port plan is requested.

## 2013-12-03 NOTE — Progress Notes (Signed)
Weekly Management Note:  Site: Right chest wall/regional lymph nodes Current Dose:  4320  cGy Projected Dose: 5040  cGy followed by right chest wall boost.  Narrative: The patient is seen today for routine under treatment assessment. CBCT/MVCT images/port films were reviewed. The chart was reviewed.   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 She is taking Lyrica and her dose was recently increased. She is currently taking one half tab of oxycodone in the morning, afternoon, and bedtime. She occasionally takes a whole tablet in the middle in either she wakes up. She uses Radioplex gel along her right chest wall and axilla. She inquires about nutritional guidance and that she has recently gained weight.  Physical Examination:  Filed Vitals:   12/03/13 1043  BP: 117/75  Pulse: 59  Temp: 97.4 F (36.3 C)  Resp: 20  .  Weight: 197 lb 14.4 oz (89.767 kg). There is moderate erythema and hyperpigmentation the skin along the right chest wall and axilla with no areas of desquamation.  Impression: Tolerating radiation therapy well.  Plan: Continue radiation therapy as planned.

## 2013-12-03 NOTE — Progress Notes (Addendum)
Pt states her right chest wall "hurts. It always hurts and is tender." Pt states "it has been this way". Pt states when she applies some pressure and rubs the area it feels somewhat better. She takes Oxycodone 1/2 tab in morning, afternoon and bedtime. She states she wakes during the night and takes another whole tab at times. She is unsure if it is pain that wakes her. Pt is applying lotion to right chest wall, has some hyperpigmentation, no desquamation. Pt reports fatigue, verbalizes concern over her weight gain since beginning radiation. She requests to see nutritionist. This RN will call B Neff today. Spoke w/B Cordella Register, nutritionist. She requests pt call her to make an appointment. Will give pt B Neff's contact information.

## 2013-12-04 ENCOUNTER — Ambulatory Visit
Admission: RE | Admit: 2013-12-04 | Discharge: 2013-12-04 | Disposition: A | Discharge status: Home / Self Care | Payer: Medicare Other | Source: Ambulatory Visit | Attending: Radiation Oncology | Admitting: Radiation Oncology

## 2013-12-05 ENCOUNTER — Ambulatory Visit
Admission: RE | Admit: 2013-12-05 | Discharge: 2013-12-05 | Disposition: A | Discharge status: Home / Self Care | Payer: Medicare Other | Source: Ambulatory Visit | Attending: Radiation Oncology | Admitting: Radiation Oncology

## 2013-12-05 DIAGNOSIS — C50519 Malignant neoplasm of lower-outer quadrant of unspecified female breast: Secondary | ICD-10-CM | POA: Diagnosis not present

## 2013-12-06 ENCOUNTER — Other Ambulatory Visit: Payer: Self-pay | Admitting: Internal Medicine

## 2013-12-06 ENCOUNTER — Ambulatory Visit
Admission: RE | Admit: 2013-12-06 | Discharge: 2013-12-06 | Disposition: A | Discharge status: Home / Self Care | Payer: Medicare Other | Source: Ambulatory Visit | Attending: Radiation Oncology | Admitting: Radiation Oncology

## 2013-12-06 ENCOUNTER — Other Ambulatory Visit (HOSPITAL_BASED_OUTPATIENT_CLINIC_OR_DEPARTMENT_OTHER): Payer: Medicare Other

## 2013-12-06 ENCOUNTER — Telehealth (INDEPENDENT_AMBULATORY_CARE_PROVIDER_SITE_OTHER): Payer: Self-pay | Admitting: *Deleted

## 2013-12-06 ENCOUNTER — Ambulatory Visit (HOSPITAL_BASED_OUTPATIENT_CLINIC_OR_DEPARTMENT_OTHER): Payer: Medicare Other | Admitting: Oncology

## 2013-12-06 VITALS — BP 129/78 | HR 69 | Temp 97.9°F | Resp 20 | Ht 59.0 in | Wt 193.6 lb

## 2013-12-06 DIAGNOSIS — C50519 Malignant neoplasm of lower-outer quadrant of unspecified female breast: Secondary | ICD-10-CM | POA: Diagnosis not present

## 2013-12-06 DIAGNOSIS — C50511 Malignant neoplasm of lower-outer quadrant of right female breast: Secondary | ICD-10-CM

## 2013-12-06 DIAGNOSIS — R220 Localized swelling, mass and lump, head: Secondary | ICD-10-CM

## 2013-12-06 DIAGNOSIS — R5381 Other malaise: Secondary | ICD-10-CM | POA: Diagnosis not present

## 2013-12-06 DIAGNOSIS — Z170 Estrogen receptor positive status [ER+]: Secondary | ICD-10-CM | POA: Diagnosis not present

## 2013-12-06 DIAGNOSIS — R5383 Other fatigue: Secondary | ICD-10-CM | POA: Diagnosis not present

## 2013-12-06 DIAGNOSIS — Z803 Family history of malignant neoplasm of breast: Secondary | ICD-10-CM | POA: Diagnosis not present

## 2013-12-06 DIAGNOSIS — M949 Disorder of cartilage, unspecified: Secondary | ICD-10-CM | POA: Diagnosis not present

## 2013-12-06 DIAGNOSIS — M899 Disorder of bone, unspecified: Secondary | ICD-10-CM | POA: Diagnosis not present

## 2013-12-06 DIAGNOSIS — E669 Obesity, unspecified: Secondary | ICD-10-CM | POA: Diagnosis not present

## 2013-12-06 DIAGNOSIS — E039 Hypothyroidism, unspecified: Secondary | ICD-10-CM

## 2013-12-06 DIAGNOSIS — R221 Localized swelling, mass and lump, neck: Principal | ICD-10-CM

## 2013-12-06 DIAGNOSIS — L680 Hirsutism: Secondary | ICD-10-CM | POA: Diagnosis not present

## 2013-12-06 DIAGNOSIS — R7309 Other abnormal glucose: Secondary | ICD-10-CM | POA: Diagnosis not present

## 2013-12-06 LAB — CBC WITH DIFFERENTIAL/PLATELET
BASO%: 0.6 % (ref 0.0–2.0)
Basophils Absolute: 0 10*3/uL (ref 0.0–0.1)
EOS%: 4.6 % (ref 0.0–7.0)
Eosinophils Absolute: 0.2 10*3/uL (ref 0.0–0.5)
HEMATOCRIT: 38.6 % (ref 34.8–46.6)
HGB: 12.6 g/dL (ref 11.6–15.9)
LYMPH%: 20.8 % (ref 14.0–49.7)
MCH: 27.2 pg (ref 25.1–34.0)
MCHC: 32.7 g/dL (ref 31.5–36.0)
MCV: 83.4 fL (ref 79.5–101.0)
MONO#: 0.5 10*3/uL (ref 0.1–0.9)
MONO%: 10.3 % (ref 0.0–14.0)
NEUT#: 3.1 10*3/uL (ref 1.5–6.5)
NEUT%: 63.7 % (ref 38.4–76.8)
PLATELETS: 176 10*3/uL (ref 145–400)
RBC: 4.63 10*6/uL (ref 3.70–5.45)
RDW: 16.4 % — ABNORMAL HIGH (ref 11.2–14.5)
WBC: 4.9 10*3/uL (ref 3.9–10.3)
lymph#: 1 10*3/uL (ref 0.9–3.3)

## 2013-12-06 LAB — COMPREHENSIVE METABOLIC PANEL (CC13)
ALK PHOS: 80 U/L (ref 40–150)
ALT: 17 U/L (ref 0–55)
ANION GAP: 9 meq/L (ref 3–11)
AST: 15 U/L (ref 5–34)
Albumin: 3.8 g/dL (ref 3.5–5.0)
BUN: 13.3 mg/dL (ref 7.0–26.0)
CO2: 27 meq/L (ref 22–29)
CREATININE: 0.8 mg/dL (ref 0.6–1.1)
Calcium: 9 mg/dL (ref 8.4–10.4)
Chloride: 105 mEq/L (ref 98–109)
Glucose: 141 mg/dl — ABNORMAL HIGH (ref 70–140)
Potassium: 4.1 mEq/L (ref 3.5–5.1)
Sodium: 140 mEq/L (ref 136–145)
Total Bilirubin: 0.49 mg/dL (ref 0.20–1.20)
Total Protein: 6.4 g/dL (ref 6.4–8.3)

## 2013-12-06 MED ORDER — TAMOXIFEN CITRATE 20 MG PO TABS: 20.0000 mg | ORAL_TABLET | Freq: Every day | ORAL | Status: DC

## 2013-12-06 MED ORDER — OXYCODONE-ACETAMINOPHEN 5-325 MG PO TABS: 1.0000 | ORAL_TABLET | Freq: Every evening | ORAL | Status: DC | PRN

## 2013-12-06 NOTE — Progress Notes (Signed)
ID: Jean Nash OB: [birth year]  MR#: [id number]  OMB#:[id number]  PCP: Drema Pry, DO GYN:   SU: Stark Klein OTHER MD: Arloa Koh, Delrae Rend, Scott MacDiarmid  CHIEF COMPLAINT: "I have breast cancer".  HISTORY OF PRESENT ILLNESS: Jean Nash had routine screening mammography age 73 2014 showing no suspicious findings. However she was found to have breast density category C. She research this, was alarmed by the fact that mammographic sensitivity is significantly decreased when the breasts are dense, and given her family history of breast cancer she opted for proceeding to bilateral breast MRI. This was performed at [facility] 08/01/2013. It showed in the right breast an area of nodular and linear enhancement spanning approximately 8.4 cm. There were no abnormal appearing lymph nodes and the left breast was unremarkable.  Biopsy of an area around the middle of the abnormal section of the right breast on 08/08/2013, showed ([id number]) an invasive and in situ ductal carcinoma, the invasive tumor being grade 1, estrogen receptor 90% positive, progesterone receptor 90% positive, with an MIB-105%. HER-2 testing is pending.  The patient's subsequent history is as detailed below   INTERVAL HISTORY: Jean Nash returns today for followup of her breast cancer accompanied by her husband Jean Nash . Since her last visit here she has started radiation and she is scheduled to complete that January 30. She is here to discuss antiestrogen therapy and how she is tolerating her treatment and dealing with her diagnosis.  REVIEW OF SYSTEMS: She is depressed because she hates the where mastectomy scars look, particularly the "dogears" in the axillae. There are uncomfortable, bother her with her brought (which mostly she does not wear) and she is eager to get them resolved. She is going to one full reconstruction. Of course she has to wait at least 6 months after completing her radiation treatments. The main  problem she has from radiation as fatigue. She will start walking and then does have no energy gets by: 642 hours. She has some postoperative pain more on the right side than the left. This is very intermittent. She has significant problems with urinary incontinence and is scheduled to see urology in 2 weeks for definitive testing. Of course she has some problems with arthritis and blurred vision which aren't long-standing. A detailed review of systems was otherwise noncontributory  PAST MEDICAL HISTORY: Past Medical History  Diagnosis Date  . Chest pain, atypical     Negative stress test 02/2009  . Other and unspecified hyperlipidemia   . Personal history of urinary calculi   . Pain in joint, shoulder region   . Irritable bowel syndrome   . Chronic insomnia   . GERD (gastroesophageal reflux disease)   . History of colonic polyps   . Fibromyalgia   . Hypothyroidism   . Other abnormal glucose   . Stress incontinence, female   . Macular degeneration   . Cluster headaches     history of migraines  . Meniscus tear     history of right knee torn meniscus  . Arthritis   . Kidney stone   . Cataract   . Depression   . Pneumonia [id number]  . Asthma     hx of -no inhalers, no problems  . H/O hiatal hernia   . Breast cancer 08/08/13    right LOQ    PAST SURGICAL HISTORY: Past Surgical History  Procedure Laterality Date  . Appendectomy    . Cholecystectomy    . Abdominal hysterectomy    . Tonsillectomy    .  Eye surgery      to repair macular hole  . Cataract extraction, bilateral    . Knee arthroscopy      > 10 years ago  . Joint replacement  03/15/11    left knee replacement  . Colonoscopy    . Ganglion cyst excision      rt foot  . Foot arthroplasty      lt   . Mass excision  11/04/2011    Procedure: EXCISION MASS;  Surgeon: Cammie Sickle., MD;  Location: [facility];  Service: Orthopedics;  Laterality: Right;  excisional biopsy right ulna mass  .  Hemorrhoid surgery      03/1993  . Breast cyst aspiration      9 cysts  . Toe surgery      preventative crossover toe surg/right foot  . Toe surgery      left foot/screw  in 2nd toe  . Upper gastrointestinal endoscopy    . Skin tags removed      breast, panty line, neckline  . Bilateral total mastectomy with axillary lymph node dissection  08/30/2013    Dr Barry Dienes  . Simple mastectomy with axillary sentinel node biopsy Left 08/30/2013    Procedure: Bilateral Breast Mastectomy ;  Surgeon: Stark Klein, MD;  Location: [facility];  Service: General;  Laterality: Left;  Marland Kitchen Mastectomy w/ sentinel node biopsy Right 08/30/2013    Procedure: RIGHT  AXILLARY SENTINEL LYMPH NODE BIOPSY; Right Axillary Node Disection;  Surgeon: Stark Klein, MD;  Location: [facility];  Service: General;  Laterality: Right;  Right side nuc med 7:00   . Evacuation breast hematoma Left 08/31/2013    Procedure: EVACUATION HEMATOMA BREAST;  Surgeon: Stark Klein, MD;  Location: [facility];  Service: General;  Laterality: Left;    FAMILY HISTORY Family History  Problem Relation Age of Onset  . Stroke Mother     died age 68  . Diabetes Mother   . Breast cancer Mother 12  . Breast cancer Sister 54  . Breast cancer Paternal Aunt 86  . Diabetes Maternal Grandfather   . Breast cancer Paternal Grandmother 27  . Breast cancer Paternal Aunt     dx in her 20s  . Cancer Maternal Grandmother     intra-abdominal cancer  . Brain cancer Maternal Uncle 8  . Brain cancer Cousin 8    maternal cousin  . Brain cancer Cousin 20    paternal cousin   the patient's father lived to be 28. The patient's mother was diagnosed with breast cancer at age 75. She died at 58 from unrelated causes. The patient had no brothers, one sister there are multiple second degree relatives with breast cancer, but no history of ovarian cancer in the family.  GYNECOLOGIC HISTORY:  Menarche age 86, first live birth age 22, she is [place] P3. She had a hysterectomy at age  40. She did not take hormone replacement. She took birth control perhaps for 2 years, in the mid-60s. She did not have any complications from that.  SOCIAL HISTORY:  She is a retired Radiation protection practitioner. Her husband Jean Nash used to be a Heritage manager. Daughter Jean Nash is a pediatrician in [place]. Daughter Jean Nash is a Engineer, water in [place]. Daughter Jean Nash is  a therapist living in [place]. The patient has 12 grandchildren. She attends the local syn[employer]    ADVANCED DIRECTIVES: In place   HEALTH MAINTENANCE: History  Substance Use Topics  . Smoking status: Former  Smoker  . Smokeless tobacco: Never Used  . Alcohol Use: No     Colonoscopy: April 2014 PAP: Status post hysterectomy  Bone density: January 2014 Lipid panel:  No Known Allergies  Current Outpatient Prescriptions  Medication Sig Dispense Refill  . amoxicillin (AMOXIL) 500 MG capsule Take 4 capsules 1 hour before dental procedures  4 capsule  1  . citalopram (CELEXA) 20 MG tablet Take 1 tablet (20 mg total) by mouth daily.  90 tablet  1  . docusate sodium (STOOL SOFTENER) 100 MG capsule Take 100 mg by mouth 3 (three) times daily as needed.       Marland Kitchen esomeprazole (NEXIUM) 40 MG capsule Take 1 capsule (40 mg total) by mouth daily.  90 capsule  3  . levothyroxine (SYNTHROID, LEVOTHROID) 50 MCG tablet Take 1 tablet (50 mcg total) by mouth daily.  90 tablet  3  . meloxicam (MOBIC) 7.5 MG tablet Take 7.5 mg by mouth daily.      . metFORMIN (GLUCOPHAGE) 1000 MG tablet Take 1 tablet (1,000 mg total) by mouth 2 (two) times daily with a meal.  180 tablet  3  . mirabegron ER (MYRBETRIQ) 25 MG TB24 tablet Take 1 tablet (25 mg total) by mouth daily.  90 tablet  3  . oxyCODONE-acetaminophen (PERCOCET/ROXICET) 5-325 MG per tablet Take 1-2 tablets by mouth every 4 (four) hours as needed (Every 4-6 hours PRN).  60 tablet  0  . pregabalin (LYRICA) 75 MG capsule Take 1 capsule (75 mg total) by mouth 2 (two) times daily.   180 capsule  1  . simvastatin (ZOCOR) 10 MG tablet Take 1 tablet (10 mg total) by mouth at bedtime.  90 tablet  3  . temazepam (RESTORIL) 7.5 MG capsule Take 1 capsule (7.5 mg total) by mouth at bedtime as needed.  90 capsule  1   No current facility-administered medications for this visit.    OBJECTIVE: Middle-aged white woman who appears stated age 16 Vitals:   12/06/13 1528  BP: 129/78  Pulse: 69  Temp: 97.9 F (36.6 C)  Resp: 20     Body mass index is 39.08 kg/(m^2).    ECOG FS:2 - Symptomatic, <50% confined to bed  Sclerae unicteric, pupils equal and round Oropharynx clear and moist-- no thrush No cervical or supraclavicular adenopathy Lungs no rales or rhonchi Heart regular rate and rhythm Abd soft, obese, nontender, positive bowel sounds MSK mild kyphosis but no focal spinal tenderness, no upper extremity lymphedema Neuro: nonfocal, well oriented, frustrated affect Breasts: Status post bilateral mastectomies. There is no evidence of local recurrence. Both incisions are healing well. Both axillae are benign.    LAB RESULTS:  CMP     Component Value Date/Time   NA 140 12/06/2013 1510   NA 137 09/05/2013 1523   K 4.1 12/06/2013 1510   K 4.2 09/05/2013 1523   CL 102 09/05/2013 1523   CO2 27 12/06/2013 1510   CO2 28 09/05/2013 1523   GLUCOSE 141* 12/06/2013 1510   GLUCOSE 102* 09/05/2013 1523   BUN 13.3 12/06/2013 1510   BUN 10 09/05/2013 1523   CREATININE 0.8 12/06/2013 1510   CREATININE 0.87 09/05/2013 1523   CREATININE 0.48* 09/01/2013 0550   CALCIUM 9.0 12/06/2013 1510   CALCIUM 8.9 09/05/2013 1523   PROT 6.4 12/06/2013 1510   PROT 6.3 06/22/2013 1618   ALBUMIN 3.8 12/06/2013 1510   ALBUMIN 4.0 06/22/2013 1618   AST 15 12/06/2013 1510   AST 13 06/22/2013 1618  ALT 17 12/06/2013 1510   ALT 19 06/22/2013 1618   ALKPHOS 80 12/06/2013 1510   ALKPHOS 58 06/22/2013 1618   BILITOT 0.49 12/06/2013 1510   BILITOT 0.5 06/22/2013 1618   GFRNONAA >90 09/01/2013 0550   GFRAA >90  09/01/2013 0550    I No results found for this basename: SPEP,  UPEP,   kappa and lambda light chains    Lab Results  Component Value Date   WBC 4.9 12/06/2013   NEUTROABS 3.1 12/06/2013   HGB 12.6 12/06/2013   HCT 38.6 12/06/2013   MCV 83.4 12/06/2013   PLT 176 12/06/2013      Chemistry      Component Value Date/Time   NA 140 12/06/2013 1510   NA 137 09/05/2013 1523   K 4.1 12/06/2013 1510   K 4.2 09/05/2013 1523   CL 102 09/05/2013 1523   CO2 27 12/06/2013 1510   CO2 28 09/05/2013 1523   BUN 13.3 12/06/2013 1510   BUN 10 09/05/2013 1523   CREATININE 0.8 12/06/2013 1510   CREATININE 0.87 09/05/2013 1523   CREATININE 0.48* 09/01/2013 0550      Component Value Date/Time   CALCIUM 9.0 12/06/2013 1510   CALCIUM 8.9 09/05/2013 1523   ALKPHOS 80 12/06/2013 1510   ALKPHOS 58 06/22/2013 1618   AST 15 12/06/2013 1510   AST 13 06/22/2013 1618   ALT 17 12/06/2013 1510   ALT 19 06/22/2013 1618   BILITOT 0.49 12/06/2013 1510   BILITOT 0.5 06/22/2013 1618       No results found for this basename: LABCA2    No components found with this basename: LABCA125    No results found for this basename: INR,  in the last 168 hours  Urinalysis    Component Value Date/Time   COLORURINE YELLOW 09/05/2013 [facility] 09/05/2013 1525   LABSPEC 1.010 09/05/2013 1525   PHURINE 6.0 09/05/2013 1525   GLUCOSEU NEG 09/05/2013 1525   HGBUR NEG 09/05/2013 1525   BILIRUBINUR NEG 09/05/2013 1525   BILIRUBINUR n 07/04/2012 1642   KETONESUR NEG 09/05/2013 1525   PROTEINUR NEG 09/05/2013 1525   UROBILINOGEN 0.2 09/05/2013 1525   UROBILINOGEN 0.2 07/04/2012 1642   NITRITE POS* 09/05/2013 1525   NITRITE n 07/04/2012 1642   LEUKOCYTESUR MOD* 09/05/2013 1525    STUDIES: Ct Chest W Contrast No results found.  ASSESSMENT: 73 y.o. BRCA negative [place] woman status post right breast lower outer quadrant biopsy 08/08/2013 for a clinical T2 N0, stage IIA invasive ductal carcinoma, grade 1,  estrogen receptor 90% positive, progesterone receptor 90% positive, with an MIB-1 of 12% and HER-2 negative  (1) status post bilateral mastectomies with right axillary lymph node dissection 08/31/2023 showing a right-sided pT1a pN2, stage IIIA invasive ductal carcinoma, grade 1, with negative margins  (2) given the marginal benefit in terms of mortality risk reduction from adjuvant chemotherapy, the patient decided to forego that option  (3) adjuvant radiation to start mid December  (4) antiestrogen therapy to start once radiation is completed  (5) osteopenia with a T score of -2.2 on bone density January 2014  (6) carries a VUS in [place] [id number]  PLAN: Ruey is tolerating her radiation well as far as her skin is concerned. However she is severely fatigued. She is struggling to do some exercise every day. She is also a little bit depressed. She got a big bruise over the holidays when her grandchildren came to visit. She is planning to meet  with a Social worker.  Today I strongly encouraged her to start a walking program. I also suggested that she increase the Mobic to 3 times a day with food (she is already on Nexium) and dry to not use any Percocet. I did write her Percocet refill so she would have" safety net" but urged her not to take it except at night and only if she absolutely needed to. She is going to try Benadryl at bedtime as well to see if that helps her sleep. She has been taking Restoril continuously now for a long time and it absolutely is not working for her.  As far as breast cancer is concerned I think she needs to pick up a little stain before we start tamoxifen. She will not start that drug until March 1. Tamoxifen is a particularly good choice for her I think because she is status post hysterectomy and because she is concerned about a testosterone increase if we use an aromatase inhibitor.  Tentatively I have made her a return appointment within me in approximately 3  months. I don't think she should plan to travel until the fall. She will want reconstruction but of course that needs to be delayed at least 6 months after the completion of radiation. She knows to call for any problems that may develop before her next visit here t.   .  Chauncey Cruel, MD   12/06/2013 3:56 PM

## 2013-12-06 NOTE — Telephone Encounter (Signed)
Patient called to ask for a refill of her Percocet.  Patient reports that she is down to taking 1/2 tablet QID.  Patient had 10/16 bilateral mastectomies and 10/17 hematoma.  Patient states continued pain but she is still receiving radiation.  Explained that I will send a message to Dr. Barry Dienes to ask for her opinion on a refill of the Percocet then we will let her know.  Patient states understanding and agreeable with POC at this time.

## 2013-12-07 ENCOUNTER — Ambulatory Visit
Admission: RE | Admit: 2013-12-07 | Discharge: 2013-12-07 | Disposition: A | Discharge status: Home / Self Care | Payer: Medicare Other | Source: Ambulatory Visit | Attending: Radiation Oncology | Admitting: Radiation Oncology

## 2013-12-07 ENCOUNTER — Ambulatory Visit
Admission: RE | Admit: 2013-12-07 | Discharge: 2013-12-07 | Disposition: A | Discharge status: Home / Self Care | Payer: Medicare Other | Source: Ambulatory Visit | Attending: Internal Medicine | Admitting: Internal Medicine

## 2013-12-07 ENCOUNTER — Telehealth: Payer: Self-pay | Admitting: Oncology

## 2013-12-07 DIAGNOSIS — R220 Localized swelling, mass and lump, head: Secondary | ICD-10-CM

## 2013-12-07 DIAGNOSIS — R221 Localized swelling, mass and lump, neck: Secondary | ICD-10-CM | POA: Diagnosis not present

## 2013-12-07 NOTE — Telephone Encounter (Signed)
, °

## 2013-12-09 NOTE — Telephone Encounter (Signed)
That is fine.  Lets do 50 percocet, no refills.

## 2013-12-10 ENCOUNTER — Telehealth (INDEPENDENT_AMBULATORY_CARE_PROVIDER_SITE_OTHER): Payer: Self-pay

## 2013-12-10 ENCOUNTER — Encounter: Payer: Self-pay | Admitting: Radiation Oncology

## 2013-12-10 ENCOUNTER — Other Ambulatory Visit (INDEPENDENT_AMBULATORY_CARE_PROVIDER_SITE_OTHER): Payer: Self-pay

## 2013-12-10 ENCOUNTER — Ambulatory Visit
Admission: RE | Admit: 2013-12-10 | Discharge: 2013-12-10 | Disposition: A | Discharge status: Home / Self Care | Payer: Medicare Other | Source: Ambulatory Visit | Attending: Radiation Oncology | Admitting: Radiation Oncology

## 2013-12-10 ENCOUNTER — Telehealth: Payer: Self-pay | Admitting: *Deleted

## 2013-12-10 VITALS — BP 126/83 | HR 69 | Temp 97.4°F | Resp 20 | Wt 190.1 lb

## 2013-12-10 DIAGNOSIS — C50511 Malignant neoplasm of lower-outer quadrant of right female breast: Secondary | ICD-10-CM

## 2013-12-10 DIAGNOSIS — C50519 Malignant neoplasm of lower-outer quadrant of unspecified female breast: Secondary | ICD-10-CM | POA: Diagnosis not present

## 2013-12-10 DIAGNOSIS — I10 Essential (primary) hypertension: Secondary | ICD-10-CM

## 2013-12-10 DIAGNOSIS — R7309 Other abnormal glucose: Secondary | ICD-10-CM

## 2013-12-10 DIAGNOSIS — E039 Hypothyroidism, unspecified: Secondary | ICD-10-CM

## 2013-12-10 MED ORDER — RADIAPLEXRX EX GEL
Freq: Once | CUTANEOUS | Status: AC
  Administered 2013-12-10: 11:00:00 via TOPICAL

## 2013-12-10 MED ORDER — OXYCODONE-ACETAMINOPHEN 5-325 MG PO TABS
1.0000 | ORAL_TABLET | ORAL | Status: DC | PRN
Start: 2013-12-10 — End: 2013-12-10

## 2013-12-10 NOTE — Telephone Encounter (Signed)
mychart message sent

## 2013-12-10 NOTE — Progress Notes (Signed)
Pt states she has some pain in her right chest wall/breast area most likely from positioning for her radiation treatment. Her Oxycodone was d/c per Dr Jana Hakim. She is taking Mobic 1 tab TID w/good relief. Pt applying Radiaplex to right chest wall for hyperpigmentation. She is fatigued, no loss of appetite. Provided pt w/second tube of Radiaplex, nutritionist's name and number.

## 2013-12-10 NOTE — Progress Notes (Signed)
Weekly Management Note:  Site: Right chest wall Current Dose:  5240  cGy Projected Dose: 6040  cGy  Narrative: The patient is seen today for routine under treatment assessment. CBCT/MVCT images/port films were reviewed. The chart was reviewed.   She is currently receiving her electron beam boost to her right chest wall/mastectomy scar. She is without new complaints today. She is off oxycodone and is now taking Mobic. She continues with Radioplex gel to her right chest wall.  Physical Examination:  Filed Vitals:   12/10/13 1025  BP: 126/83  Pulse: 69  Temp: 97.4 F (36.3 C)  Resp: 20  .  Weight: 190 lb 1.6 oz (86.229 kg). There is erythema and moderate hyperpigmentation the skin along the right chest wall with patchy dry desquamation. No areas of moist desquamation.  Impression: Tolerating radiation therapy well.  Plan: Continue radiation therapy as planned.

## 2013-12-10 NOTE — Telephone Encounter (Signed)
Pt informed that Dr. Jana Hakim has increased her Mobic 7.5mg  from qd to tid.  According to her, he does not want her taking any more Percocet.  Medication was discontinued, and Dr. Barry Dienes made aware.

## 2013-12-11 ENCOUNTER — Ambulatory Visit
Admission: RE | Admit: 2013-12-11 | Discharge: 2013-12-11 | Disposition: A | Discharge status: Home / Self Care | Payer: Medicare Other | Source: Ambulatory Visit | Attending: Radiation Oncology | Admitting: Radiation Oncology

## 2013-12-11 ENCOUNTER — Encounter: Payer: Self-pay | Admitting: Internal Medicine

## 2013-12-12 ENCOUNTER — Ambulatory Visit
Admission: RE | Admit: 2013-12-12 | Discharge: 2013-12-12 | Disposition: A | Discharge status: Home / Self Care | Payer: Medicare Other | Source: Ambulatory Visit | Attending: Radiation Oncology | Admitting: Radiation Oncology

## 2013-12-12 DIAGNOSIS — C50519 Malignant neoplasm of lower-outer quadrant of unspecified female breast: Secondary | ICD-10-CM | POA: Diagnosis not present

## 2013-12-12 MED ORDER — MELOXICAM 7.5 MG PO TABS: 7.5000 mg | ORAL_TABLET | Freq: Two times a day (BID) | ORAL | Status: DC

## 2013-12-13 ENCOUNTER — Ambulatory Visit
Admission: RE | Admit: 2013-12-13 | Discharge: 2013-12-13 | Disposition: A | Discharge status: Home / Self Care | Payer: Medicare Other | Source: Ambulatory Visit | Attending: Radiation Oncology | Admitting: Radiation Oncology

## 2013-12-13 ENCOUNTER — Other Ambulatory Visit (INDEPENDENT_AMBULATORY_CARE_PROVIDER_SITE_OTHER): Payer: Medicare Other

## 2013-12-13 ENCOUNTER — Encounter: Payer: Self-pay | Admitting: Radiation Oncology

## 2013-12-13 DIAGNOSIS — R7309 Other abnormal glucose: Secondary | ICD-10-CM

## 2013-12-13 DIAGNOSIS — I10 Essential (primary) hypertension: Secondary | ICD-10-CM | POA: Diagnosis not present

## 2013-12-13 DIAGNOSIS — E039 Hypothyroidism, unspecified: Secondary | ICD-10-CM

## 2013-12-13 LAB — BASIC METABOLIC PANEL
BUN: 12 mg/dL (ref 6–23)
CHLORIDE: 106 meq/L (ref 96–112)
CO2: 27 mEq/L (ref 19–32)
Calcium: 9.1 mg/dL (ref 8.4–10.5)
Creatinine, Ser: 0.8 mg/dL (ref 0.4–1.2)
GFR: 81.04 mL/min (ref >60.00)
Glucose, Bld: 110 mg/dL — ABNORMAL HIGH (ref 70–99)
POTASSIUM: 3.9 meq/L (ref 3.5–5.1)
Sodium: 139 mEq/L (ref 135–145)

## 2013-12-13 LAB — T4, FREE: Free T4: 1.14 ng/dL (ref 0.60–1.60)

## 2013-12-13 LAB — TSH: TSH: 0.19 u[IU]/mL — ABNORMAL LOW (ref 0.35–5.50)

## 2013-12-13 LAB — HEMOGLOBIN A1C: HEMOGLOBIN A1C: 6.2 % (ref 4.6–6.5)

## 2013-12-13 NOTE — Progress Notes (Signed)
Weekly Management Note:  Site: Right chest wall/mastectomy scar boost Current Dose:  5840  cGy Projected Dose: 6040  cGy  Narrative: The patient is seen today for routine under treatment assessment. CBCT/MVCT images/port films were reviewed. The chart was reviewed.   She is without complaints today.  Physical Examination: There were no vitals filed for this visit..  Weight:  . There is marked erythema and hyperpigmentation of her skin along the right chest wall and axilla but no areas of moist desquamation. There is patchy dry desquamation.  Impression: Tolerating radiation therapy well.  Plan: Continue radiation therapy as planned. She will finish her radiation therapy tomorrow and return here for a one-month followup visit.

## 2013-12-14 ENCOUNTER — Ambulatory Visit
Admission: RE | Admit: 2013-12-14 | Discharge: 2013-12-14 | Disposition: A | Discharge status: Home / Self Care | Payer: Medicare Other | Source: Ambulatory Visit | Attending: Radiation Oncology | Admitting: Radiation Oncology

## 2013-12-17 ENCOUNTER — Encounter: Payer: Self-pay | Admitting: Radiation Oncology

## 2013-12-17 NOTE — Progress Notes (Signed)
[facility] End of Treatment Note  Name:Jean Nash  Date: 12/17/2013 OJJ:[id number] DOB:05-Dec-1940   Status:outpatient    CC: Drema Pry, DO ,  Dr. Stark Klein  REFERRING PHYSICIAN:  Dr. Stark Klein   DIAGNOSIS:  Stage IIIA (T1 N2a M0) invasive lobular carcinoma of the right breast  INDICATION FOR TREATMENT: Curative   TREATMENT DATES: 10/29/2013 through 12/14/2013                          SITE/DOSE: Right chest wall 5040 cGy 28 sessions, right supraclavicular/axillary region 5040 cGy 28 sessions, right chest wall electron beam boost 1000 cGy in 5 sessions                           BEAMS/ENERGY:  Tangential fields to the right chest wall with mixed 6 MV/10 MV photons., LAO field to the right supraclavicular/axillary region, doses prescribed at 3 cm depth with 10 MV photons. PA axillary field to bring the mid axillary dose up to 4600 cGy in 28 sessions with 6 MV photons. Right chest wall mastectomy scar boost with 6 MEV electrons peer 1.0 cm custom bolus was applied to the right chest wall every other day to maximize the dose to the skin surface. 0.8 cm bolus was applied to the right mastectomy scar boost field to maximize the dose to the skin surface.                 NARRATIVE:   The patient tolerated her treatment markedly well with patchy dry desquamation the skin and no areas of moist desquamation by completion of therapy. She used Radioplex gel during her therapy.                         PLAN: Routine followup in one month. Patient instructed to call if questions or worsening complaints in interim.

## 2013-12-19 ENCOUNTER — Other Ambulatory Visit: Payer: Self-pay | Admitting: Internal Medicine

## 2013-12-19 ENCOUNTER — Encounter: Payer: Medicare Other | Admitting: Nutrition

## 2013-12-19 MED ORDER — OSELTAMIVIR PHOSPHATE 75 MG PO CAPS: 75.0000 mg | ORAL_CAPSULE | Freq: Every day | ORAL | Status: DC

## 2013-12-21 ENCOUNTER — Ambulatory Visit (INDEPENDENT_AMBULATORY_CARE_PROVIDER_SITE_OTHER): Payer: Medicare Other | Admitting: Internal Medicine

## 2013-12-21 ENCOUNTER — Encounter: Payer: Self-pay | Admitting: Internal Medicine

## 2013-12-21 VITALS — BP 124/84 | Temp 98.1°F | Ht 59.0 in | Wt 184.0 lb

## 2013-12-21 DIAGNOSIS — R7309 Other abnormal glucose: Secondary | ICD-10-CM | POA: Diagnosis not present

## 2013-12-21 DIAGNOSIS — C50519 Malignant neoplasm of lower-outer quadrant of unspecified female breast: Secondary | ICD-10-CM

## 2013-12-21 DIAGNOSIS — C50511 Malignant neoplasm of lower-outer quadrant of right female breast: Secondary | ICD-10-CM

## 2013-12-21 MED ORDER — TRAMADOL HCL 50 MG PO TABS: 50.0000 mg | ORAL_TABLET | Freq: Two times a day (BID) | ORAL | Status: DC | PRN

## 2013-12-21 NOTE — Assessment & Plan Note (Signed)
Patient discontinued metformin.  She has lost weight.  Consider restart in 6 months.

## 2013-12-21 NOTE — Progress Notes (Signed)
Subjective:    [name], female    DOB: [birth year], 73 y.o.   MRN: [id number]  HPI  73 year old white female with history of recently diagnosed breast cancer, hypothyroidism, fibromyalgia and PCOS for routine followup.  Patient has lost some weight since previous visit. She reports her appetite is normal.  She is experiencing post radiation pain over right chest. Area is red and irritated. She is currently taking Mobic 7.5 mg twice daily. Patient was instructed by her oncologist to stop Percocet.  PCOS - she stopped taking metformin  Hypertriglyceridemia - Patient stopped taking Lovaza   Review of Systems fatigue    Past Medical History  Diagnosis Date  . Chest pain, atypical     Negative stress test 02/2009  . Other and unspecified hyperlipidemia   . Personal history of urinary calculi   . Pain in joint, shoulder region   . Irritable bowel syndrome   . Chronic insomnia   . GERD (gastroesophageal reflux disease)   . History of colonic polyps   . Fibromyalgia   . Hypothyroidism   . Other abnormal glucose   . Stress incontinence, female   . Macular degeneration   . Cluster headaches     history of migraines  . Meniscus tear     history of right knee torn meniscus  . Arthritis   . Kidney stone   . Cataract   . Depression   . Pneumonia 4627,0350  . Asthma     hx of -no inhalers, no problems  . H/O hiatal hernia   . Breast cancer 08/08/13    right LOQ    History   Social History  . Marital Status: Married    Spouse Name: N/A    Number of Children: 3  . Years of Education: N/A   Occupational History  . Not on file.   Social History Main Topics  . Smoking status: Former Research scientist (life sciences)  . Smokeless tobacco: Never Used  . Alcohol Use: No  . Drug Use: No  . Sexual Activity: Not on file     Comment: menarche age 55, fist live birth 25, P 3, hysterectomy age 68, no HRT, BCP 2 yrs   Other Topics Concern  . 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 Appendectomy    . Cholecystectomy    . Abdominal hysterectomy    . Tonsillectomy    . Eye surgery      to repair macular hole  . Cataract extraction, bilateral    . Knee arthroscopy      > 10 years ago  . Joint replacement  03/15/11    left knee replacement  . Colonoscopy    . Ganglion cyst excision      rt foot  . Foot arthroplasty      lt   . Mass excision  11/04/2011    Procedure: EXCISION MASS;  Surgeon: Cammie Sickle., MD;  Location: [facility];  Service: Orthopedics;  Laterality: Right;  excisional biopsy right ulna mass  . Hemorrhoid surgery      03/1993  . Breast cyst aspiration      9 cysts  . Toe surgery      preventative crossover toe surg/right  foot  . Toe surgery      left foot/screw  in 2nd toe  . Upper gastrointestinal endoscopy    . Skin tags removed      breast, panty line, neckline  . Bilateral total mastectomy with axillary lymph node dissection  08/30/2013    Dr Barry Dienes  . Simple mastectomy with axillary sentinel node biopsy Left 08/30/2013    Procedure: Bilateral Breast Mastectomy ;  Surgeon: Stark Klein, MD;  Location: [place];  Service: General;  Laterality: Left;  Marland Kitchen Mastectomy w/ sentinel node biopsy Right 08/30/2013    Procedure: RIGHT  AXILLARY SENTINEL LYMPH NODE BIOPSY; Right Axillary Node Disection;  Surgeon: Stark Klein, MD;  Location: [place];  Service: General;  Laterality: Right;  Right side nuc med 7:00   . Evacuation breast hematoma Left 08/31/2013    Procedure: EVACUATION HEMATOMA BREAST;  Surgeon: Stark Klein, MD;  [facility];  Service: General;  Laterality: Left;    Family History  Problem Relation Age of Onset  . Stroke Mother     died age 37  . Diabetes Mother   . Breast cancer Mother 72  . Breast cancer Sister 4  . Breast cancer Paternal  Aunt 56  . Diabetes Maternal Grandfather   . Breast cancer Paternal Grandmother 34  . Breast cancer Paternal Aunt     dx in her 55s  . Cancer Maternal Grandmother     intra-abdominal cancer  . Brain cancer Maternal Uncle 8  . Brain cancer Cousin 68    maternal cousin  . Brain cancer Cousin 20    paternal cousin    No Known Allergies  Current Outpatient Prescriptions on File Prior to Visit  Medication Sig Dispense Refill  . citalopram (CELEXA) 20 MG tablet Take 1 tablet (20 mg total) by mouth daily.  90 tablet  1  . docusate sodium (STOOL SOFTENER) 100 MG capsule Take 100 mg by mouth 3 (three) times daily as needed.       Marland Kitchen esomeprazole (NEXIUM) 40 MG capsule Take 1 capsule (40 mg total) by mouth daily.  90 capsule  3  . levothyroxine (SYNTHROID, LEVOTHROID) 50 MCG tablet Take 1 tablet (50 mcg total) by mouth daily.  90 tablet  3  . mirabegron ER (MYRBETRIQ) 25 MG TB24 tablet Take 1 tablet (25 mg total) by mouth daily.  90 tablet  3  . oseltamivir (TAMIFLU) 75 MG capsule Take 1 capsule (75 mg total) by mouth daily.  10 capsule  0  . pregabalin (LYRICA) 75 MG capsule Take 1 capsule (75 mg total) by mouth 2 (two) times daily.  180 capsule  1  . simvastatin (ZOCOR) 10 MG tablet Take 1 tablet (10 mg total) by mouth at bedtime.  90 tablet  3  . tamoxifen (NOLVADEX) 20 MG tablet Take 1 tablet (20 mg total) by mouth daily.  90 tablet  4   No current facility-administered medications on file prior to visit.    BP 124/84  Temp(Src) 98.1 F (36.7 C) (Oral)  Ht 4\' 11"  (1.499 m)  Wt 184 lb (83.462 kg)  BMI 37.14 kg/m2    Objective:   Physical Exam  Constitutional: She appears well-developed and well-nourished. No distress.  Cardiovascular: Normal rate, regular rhythm and normal heart sounds.   Pulmonary/Chest: Effort normal and breath sounds normal. She has no wheezes.  Skin:  Redness over radiation site - right chest          Assessment & Plan:

## 2013-12-21 NOTE — Assessment & Plan Note (Addendum)
Patient experiencing post radiation pain over right chest. Use tramadol 50 mg and acetaminophen 325 mg every 12 hours as needed. Patient to followup with her radiation oncologist regarding further instructions if symptoms persist or worsen.  Patient also unhappy with "dog ears" after mastectomy.  She plans to follow up with her surgeon for possible correction in Oct, 2015.

## 2013-12-21 NOTE — Patient Instructions (Signed)
Take 1/2 of Benadryl for sleep Take Tramadol 50 mg and acetaminophen 325 mg twice daily as needed.

## 2013-12-21 NOTE — Progress Notes (Signed)
Pre visit review using our clinic review tool, if applicable. No additional management support is needed unless otherwise documented below in the visit note. 

## 2013-12-22 ENCOUNTER — Encounter: Payer: Self-pay | Admitting: Internal Medicine

## 2013-12-25 ENCOUNTER — Encounter: Payer: Self-pay | Admitting: Nutrition

## 2013-12-25 ENCOUNTER — Encounter: Payer: Medicare Other | Admitting: Nutrition

## 2013-12-25 NOTE — Progress Notes (Signed)
Patient called and cancelled nutrition appointment.  She will call and reschedule when she feels better.

## 2013-12-28 NOTE — Progress Notes (Signed)
Talked with pt an d gave info- scheduled tsg 1 week prior to appointment

## 2014-01-09 ENCOUNTER — Telehealth: Payer: Self-pay | Admitting: Nutrition

## 2014-01-09 DIAGNOSIS — M19079 Primary osteoarthritis, unspecified ankle and foot: Secondary | ICD-10-CM | POA: Diagnosis not present

## 2014-01-09 NOTE — Telephone Encounter (Signed)
I returned patient's phone call to assist her with making a nutrition appointment.  Patient did not answer phone.  Had to leave a message for her to call back.

## 2014-01-11 ENCOUNTER — Encounter: Payer: Self-pay | Admitting: *Deleted

## 2014-01-14 NOTE — Telephone Encounter (Signed)
Pt called back to cancel her appt for 03/28/14 and to rs for 04/02/14. gv appt for 04/02/14 w/ labs@ 2:30pm and ov@ 3pm. Pt is aware of her appts...td

## 2014-01-14 NOTE — Telephone Encounter (Signed)
Pt called stating that she will be out of town on 03/19/14 and will return on 03/27/14. She requested to rs until then. gv appt for 03/28/14 w/ labs@ 2pm and ov@2 :3opm. Pt is aware...td

## 2014-01-15 ENCOUNTER — Encounter: Payer: Self-pay | Admitting: Radiation Oncology

## 2014-01-15 ENCOUNTER — Ambulatory Visit
Admission: RE | Admit: 2014-01-15 | Discharge: 2014-01-15 | Disposition: A | Discharge status: Home / Self Care | Payer: Medicare Other | Source: Ambulatory Visit | Attending: Radiation Oncology | Admitting: Radiation Oncology

## 2014-01-15 ENCOUNTER — Other Ambulatory Visit (INDEPENDENT_AMBULATORY_CARE_PROVIDER_SITE_OTHER): Payer: Self-pay | Admitting: *Deleted

## 2014-01-15 VITALS — BP 105/74 | HR 89 | Temp 98.3°F | Resp 20 | Wt 188.0 lb

## 2014-01-15 DIAGNOSIS — C50511 Malignant neoplasm of lower-outer quadrant of right female breast: Secondary | ICD-10-CM

## 2014-01-15 MED ORDER — UNABLE TO FIND: Status: DC

## 2014-01-15 NOTE — Progress Notes (Signed)
Followup note:  Jean Nash visits today approximately 5 weeks following completion of radiation therapy to her right chest wall and regional lymph nodes in the management of her T1 N2a invasive lobular carcinoma the right breast. She is without complaints today except did have some right upper anterior chest wall pain while in the waiting room today. This is the first time she experienced this pain. It lasted a few minutes and has not returned. Her fatigue has resolved, and she is excited about visiting her great-grandchildren in [place] this spring. She sees Dr. Barry Dienes on March 20 and Dr. Jana Hakim on March 19. She is on adjuvant tamoxifen.  Physical examination: Alert and oriented. Filed Vitals:   01/15/14 1528  BP: 105/74  Pulse: 89  Temp: 98.3 F (36.8 C)  Resp: 20   Head and neck examination: Grossly unremarkable. Nodes: There is no palpable cervical, supraclavicular, or axillary lymphadenopathy. Chest: There is residual hyperpigmentation of the skin along the right chest wall and axilla. There is 1-2 cm superficial eschar along her upper chest wall. There is no moist desquamation or drainage. No visible or palpable evidence for persistent/recurrent disease. Left chest wall also unremarkable to palpation and inspection. Extremities: Without edema.  Impression: Satisfactory progress. She is in good spirits. She tells me she will have Dr. Barry Dienes prescribed breast prostheses.  Plan: Followup through Dr. Barry Dienes and Dr. Jana Hakim.

## 2014-01-15 NOTE — Progress Notes (Signed)
Pt denies pain but states while she was in waiting room she had pain in her right chest wall area that went away on it's own. She states she has not had that pain in past. Pt states her fatigue is resolving. She has some skin pigmentation changes remaining on her right chest wall area and one small open area that is dry.

## 2014-01-17 ENCOUNTER — Encounter: Payer: Self-pay | Admitting: Internal Medicine

## 2014-01-18 ENCOUNTER — Encounter: Payer: Self-pay | Admitting: Internal Medicine

## 2014-01-21 MED ORDER — PREGABALIN 75 MG PO CAPS: 75.0000 mg | ORAL_CAPSULE | Freq: Two times a day (BID) | ORAL | Status: DC

## 2014-01-24 ENCOUNTER — Ambulatory Visit: Payer: Medicare Other | Admitting: Nutrition

## 2014-01-24 NOTE — Progress Notes (Signed)
73 year old female diagnosed with breast cancer on adjuvant tamoxifen.  Past medical history includes IBS, chronic insomnia, GERD, fibromyalgia, and depression.  Medications include Celexa, Synthroid, Glucophage, Lyrica, Zocor, and Restoril.  Labs include A1c is 6.2, TSH of 0.19, glucose 141.  Height: 4 feet 11 inches. Weight: 188 pounds. BMI: 37.95.  Patient would like education on healthy diet.  Patient's husband does all the cooking and she eats what he cooks for her.  She is a very particular eater and does not eat many foods.  She does love vegetables and she typically eats 2 meals daily.  She would like to lose weight.  Patient is not exercising.  Nutrition diagnosis: Limited adherence to nutrition related recommendations related to unwilling to apply information.  As evidenced by BMI of 37.95.  Intervention: I educated patient and husband on the importance of a plant-based diet utilizing lean protein sources.  I reviewed recommendations for soy intake for breast cancer patients.  I encouraged patient to continue to increase vegetables and whole fruits, as well as whole grains.  I discussed several ways she could make very simple and easy changes to her present diet to improve overall health.  Encouraged patient to begin exercise program to help with weight loss.  Multiple fact sheets were provided.  Questions were answered.  Teach back method used.  Monitoring, evaluation, goals: Patient will begin to make gradual changes to consume a healthier plant-based diet.  Next visit: There is no followup scheduled at this time.

## 2014-01-25 ENCOUNTER — Other Ambulatory Visit (INDEPENDENT_AMBULATORY_CARE_PROVIDER_SITE_OTHER): Payer: Self-pay | Admitting: *Deleted

## 2014-01-25 MED ORDER — UNABLE TO FIND: Status: DC

## 2014-02-01 ENCOUNTER — Ambulatory Visit (INDEPENDENT_AMBULATORY_CARE_PROVIDER_SITE_OTHER): Payer: Medicare Other | Admitting: General Surgery

## 2014-02-01 ENCOUNTER — Encounter (INDEPENDENT_AMBULATORY_CARE_PROVIDER_SITE_OTHER): Payer: Self-pay | Admitting: General Surgery

## 2014-02-01 VITALS — BP 128/82 | HR 75 | Temp 97.8°F | Resp 16 | Ht 59.0 in | Wt 192.0 lb

## 2014-02-01 DIAGNOSIS — C50511 Malignant neoplasm of lower-outer quadrant of right female breast: Secondary | ICD-10-CM

## 2014-02-01 DIAGNOSIS — C50519 Malignant neoplasm of lower-outer quadrant of unspecified female breast: Secondary | ICD-10-CM | POA: Diagnosis not present

## 2014-02-01 NOTE — Patient Instructions (Signed)
Follow up in 6 months.  OK to take bubble bath.

## 2014-02-01 NOTE — Assessment & Plan Note (Signed)
No clinical evidence of disease.  Follow up in 6 months.  Continue tamoxifen.   Will plan revision of lateral portions of scar in fall.

## 2014-02-01 NOTE — Progress Notes (Signed)
HISTORY: Pt is a 73 yo F now approx 5 months s/p bilateral mastectomy, R ALND for right breast cancer.  She had to be taken back for hematoma.  She is doing well overall now.  She has some pain laterally where the dog ears are.  She also had a lot of blistering from radiation.  This is healed.  She also has occasional chest wall pain on the right.     PERTINENT REVIEW OF SYSTEMS: Otherwise negative.    Filed Vitals:   02/01/14 0924  BP: 128/82  Pulse: 75  Temp: 97.8 F (36.6 C)  Resp: 16   Wt Readings from Last 3 Encounters:  02/01/14 192 lb (87.091 kg)  01/15/14 188 lb (85.276 kg)  12/21/13 184 lb (83.462 kg)    EXAM: Head: Normocephalic and atraumatic.  Eyes:  Conjunctivae are normal. Pupils are equal, round, and reactive to light. No scleral icterus.  Neck:  Normal range of motion. Neck supple. No tracheal deviation present. No thyromegaly present.  Resp: No respiratory distress, normal effort. Breast:  Chest wall without nodules.  No supra, infra clavicular adenopathy.  No axillary adenopathy.  Dog ears present at upper and lower portion of scar.   Abd:  Abdomen is soft, non distended and non tender. No masses are palpable.  There is no rebound and no guarding.  Neurological: Alert and oriented to person, place, and time. Coordination normal.  Skin: Skin is warm and dry. No rash noted. No diaphoretic. No erythema. No pallor.  Psychiatric: Normal mood and affect. Normal behavior. Judgment and thought content normal.      ASSESSMENT AND PLAN:   Breast cancer of lower-outer quadrant of right female breast, s/p bilateral mastectomy, R ALND 08/2013 No clinical evidence of disease.  Follow up in 6 months.  Continue tamoxifen.   Will plan revision of lateral portions of scar in fall.        Milus Height, MD Surgical Oncology, [facility]  Drema Pry, DO Shawna Orleans, Doe-Hyun R, DO

## 2014-02-13 ENCOUNTER — Encounter: Payer: Self-pay | Admitting: Internal Medicine

## 2014-02-18 ENCOUNTER — Other Ambulatory Visit (INDEPENDENT_AMBULATORY_CARE_PROVIDER_SITE_OTHER): Payer: Medicare Other

## 2014-02-18 DIAGNOSIS — E039 Hypothyroidism, unspecified: Secondary | ICD-10-CM | POA: Diagnosis not present

## 2014-02-18 DIAGNOSIS — Z79899 Other long term (current) drug therapy: Secondary | ICD-10-CM | POA: Diagnosis not present

## 2014-02-18 LAB — TSH: TSH: 4.44 u[IU]/mL (ref 0.35–5.50)

## 2014-02-19 ENCOUNTER — Encounter: Payer: Self-pay | Admitting: Podiatry

## 2014-02-19 ENCOUNTER — Ambulatory Visit (INDEPENDENT_AMBULATORY_CARE_PROVIDER_SITE_OTHER): Payer: Medicare Other | Admitting: Podiatry

## 2014-02-19 VITALS — BP 134/77 | HR 73 | Resp 15 | Ht 59.5 in | Wt 190.0 lb

## 2014-02-19 DIAGNOSIS — B351 Tinea unguium: Secondary | ICD-10-CM | POA: Diagnosis not present

## 2014-02-19 DIAGNOSIS — M79609 Pain in unspecified limb: Secondary | ICD-10-CM

## 2014-02-19 NOTE — Progress Notes (Signed)
She presents today with a chief complaint of painful elongated toenails.  Objective: Vital signs are stable she is alert and oriented x3. Pulses are palpable. Her nails are thick yellow dystrophic lytic mycotic painful palpation.  Assessment: Pain in limb secondary to onychomycosis 1 through 5 bilateral.  Plan: Debridement nails in thickness with cover service secondary to pain

## 2014-02-20 ENCOUNTER — Other Ambulatory Visit: Payer: BC Managed Care – PPO

## 2014-02-22 ENCOUNTER — Encounter: Payer: Self-pay | Admitting: Internal Medicine

## 2014-02-22 ENCOUNTER — Ambulatory Visit (INDEPENDENT_AMBULATORY_CARE_PROVIDER_SITE_OTHER): Payer: Medicare Other | Admitting: Internal Medicine

## 2014-02-22 VITALS — BP 132/90 | HR 68 | Temp 98.0°F | Wt 194.0 lb

## 2014-02-22 DIAGNOSIS — M2550 Pain in unspecified joint: Secondary | ICD-10-CM

## 2014-02-22 DIAGNOSIS — E039 Hypothyroidism, unspecified: Secondary | ICD-10-CM | POA: Diagnosis not present

## 2014-02-22 DIAGNOSIS — R7309 Other abnormal glucose: Secondary | ICD-10-CM

## 2014-02-22 DIAGNOSIS — L4050 Arthropathic psoriasis, unspecified: Secondary | ICD-10-CM | POA: Diagnosis not present

## 2014-02-22 DIAGNOSIS — L409 Psoriasis, unspecified: Secondary | ICD-10-CM

## 2014-02-22 DIAGNOSIS — F4321 Adjustment disorder with depressed mood: Secondary | ICD-10-CM

## 2014-02-22 DIAGNOSIS — L408 Other psoriasis: Secondary | ICD-10-CM

## 2014-02-22 DIAGNOSIS — F329 Major depressive disorder, single episode, unspecified: Secondary | ICD-10-CM

## 2014-02-22 MED ORDER — LEVOTHYROXINE SODIUM 75 MCG PO TABS: 75.0000 ug | ORAL_TABLET | Freq: Every day | ORAL | Status: DC

## 2014-02-22 NOTE — Patient Instructions (Addendum)
Please complete the following lab tests before your next follow up appointment: BMET - 401.9 TSH - 244.9 A1c - 790.29

## 2014-02-22 NOTE — Assessment & Plan Note (Signed)
Patient experiencing significant hot flashes. We discussed possibly switching citalopram to Effexor.

## 2014-02-22 NOTE — Progress Notes (Signed)
Subjective:    [name], female    DOB: 1941/03/13, 73 y.o.   MRN: [id number]  HPI  73 year old white female with recently diagnosed breast cancer, hypothyroidism and PCOS for followup.  Patient reports ongoing issues with joint pains. She is currently having flare in her right thumb. She has been known to have joint pains in elbows and knees. She has history of mild psoriasis. She currently has mild flare of skin symptoms near right elbow.   Patient able to taper off Percocet and tramadol. She is currently taking Mobic 7.5 mg twice daily.  Patient reports experiencing significant hot flashes since starting tamoxifen.  Adjustment disorder with depressed mood-patient would like to consider discontinuing citalopram.  Hypothyroidism-her last TSH was not in therapeutic range  Review of Systems Fatigue and weight gain    Past Medical History  Diagnosis Date  . Chest pain, atypical     Negative stress test 02/2009  . Other and unspecified hyperlipidemia   . Personal history of urinary calculi   . Pain in joint, shoulder region   . Irritable bowel syndrome   . Chronic insomnia   . GERD (gastroesophageal reflux disease)   . History of colonic polyps   . Fibromyalgia   . Hypothyroidism   . Other abnormal glucose   . Stress incontinence, female   . Macular degeneration   . Cluster headaches     history of migraines  . Meniscus tear     history of right knee torn meniscus  . Arthritis   . Kidney stone   . Cataract   . Depression   . Pneumonia 0938,1829  . Asthma     hx of -no inhalers, no problems  . H/O hiatal hernia   . Breast cancer 08/08/13    right LOQ  . Hx of radiation therapy 10/29/13- 12/14/13    right chest wall 5040 cGy 28 sessions, right supraclavicular/axillary region 5040 cGy 28 sessions, right chest wall boost 1000 cGy 5 sessions    History   Social History  . Marital Status: Married    Spouse Name: N/A    Number of Children: 3  . Years  of Education: N/A   Occupational History  . Not on file.   Social History Main Topics  . Smoking status: Former Research scientist (life sciences)  . Smokeless tobacco: Never Used  . Alcohol Use: No  . Drug Use: No  . Sexual Activity: Not on file     Comment: menarche age 54, fist live birth 42, P 3, hysterectomy age 71, no HRT, BCP 2 yrs   Other Topics Concern  . 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 Appendectomy    . Cholecystectomy    . Abdominal hysterectomy    . Tonsillectomy    . Eye surgery      to repair macular hole  . Cataract extraction, bilateral    . Knee arthroscopy      > 10 years ago  . Joint replacement  03/15/11    left knee replacement  . Colonoscopy    . Ganglion cyst excision      rt foot  . Foot arthroplasty      lt   . Mass excision  11/04/2011  Procedure: EXCISION MASS;  Surgeon: Cammie Sickle., MD;  Location: [place];  Service: Orthopedics;  Laterality: Right;  excisional biopsy right ulna mass  . Hemorrhoid surgery      03/1993  . Breast cyst aspiration      9 cysts  . Toe surgery      preventative crossover toe surg/right foot  . Toe surgery      left foot/screw  in 2nd toe  . Upper gastrointestinal endoscopy    . Skin tags removed      breast, panty line, neckline  . Bilateral total mastectomy with axillary lymph node dissection  08/30/2013    Dr Barry Dienes  . Simple mastectomy with axillary sentinel node biopsy Left 08/30/2013    Procedure: Bilateral Breast Mastectomy ;  Surgeon: Stark Klein, MD;  Location: [place];  Service: General;  Laterality: Left;  Marland Kitchen Mastectomy w/ sentinel node biopsy Right 08/30/2013    Procedure: RIGHT  AXILLARY SENTINEL LYMPH NODE BIOPSY; Right Axillary Node Disection;  Surgeon: Stark Klein, MD;  Location: [facility];  Service: General;  Laterality:  Right;  Right side nuc med 7:00   . Evacuation breast hematoma Left 08/31/2013    Procedure: EVACUATION HEMATOMA BREAST;  Surgeon: Stark Klein, MD;  Location[facility];  Service: General;  Laterality: Left;    Family History  Problem Relation Age of Onset  . Stroke Mother     died age 71  . Diabetes Mother   . Breast cancer Mother 54  . Breast cancer Sister 4  . Breast cancer Paternal Aunt 3  . Diabetes Maternal Grandfather   . Breast cancer Paternal Grandmother 55  . Breast cancer Paternal Aunt     dx in her 3s  . Cancer Maternal Grandmother     intra-abdominal cancer  . Brain cancer Maternal Uncle 8  . Brain cancer Cousin 66    maternal cousin  . Brain cancer Cousin 20    paternal cousin    No Known Allergies  Current Outpatient Prescriptions on File Prior to Visit  Medication Sig Dispense Refill  . citalopram (CELEXA) 20 MG tablet Take 1 tablet (20 mg total) by mouth daily.  90 tablet  1  . docusate sodium (STOOL SOFTENER) 100 MG capsule Take 100 mg by mouth 3 (three) times daily as needed.       Marland Kitchen esomeprazole (NEXIUM) 40 MG capsule Take 1 capsule (40 mg total) by mouth daily.  90 capsule  3  . levothyroxine (SYNTHROID, LEVOTHROID) 50 MCG tablet Take 1 tablet (50 mcg total) by mouth daily.  90 tablet  3  . pregabalin (LYRICA) 75 MG capsule Take 1 capsule (75 mg total) by mouth 2 (two) times daily.  180 capsule  1  . simvastatin (ZOCOR) 10 MG tablet Take 1 tablet (10 mg total) by mouth at bedtime.  90 tablet  3  . tamoxifen (NOLVADEX) 20 MG tablet Take 1 tablet (20 mg total) by mouth daily.  90 tablet  4  . UNABLE TO FIND Rx: L8000- Post Surgical Bras (Quantity: 6) G2952- Silicone Breast prosthesis (Quantity: 2) Dx: 174.9; Bilateral mastectomies  1 each  0  . UNABLE TO FIND Rx: [id number] silicone breast prosthesis (qty 2) Athletic Form DX: 174.9 Bilateral Masty  1 each  0   No current facility-administered medications on file prior to visit.    BP 132/90  Pulse 68   Temp(Src) 98 F (36.7 C) (Oral)  Wt 194 lb (87.998 kg)  Objective:   Physical Exam  Constitutional: She is oriented to person, place, and time. She appears well-developed and well-nourished. No distress.  HENT:  Head: Normocephalic and atraumatic.  Eyes: EOM are normal. Pupils are equal, round, and reactive to light.  Cardiovascular: Normal rate, regular rhythm and normal heart sounds.   Pulmonary/Chest: Effort normal and breath sounds normal. She has no wheezes.  Musculoskeletal: She exhibits no edema.  Neurological: She is alert and oriented to person, place, and time. No cranial nerve deficit.  Skin:  Mild silvery plaque on right elbow (extensor surface)  Psychiatric: She has a normal mood and affect. Her behavior is normal.          Assessment & Plan:

## 2014-02-22 NOTE — Assessment & Plan Note (Signed)
She is regaining weight.  Monitor weight. We discussed possibly restarting metformin.

## 2014-02-22 NOTE — Assessment & Plan Note (Signed)
Patient has mild rash on extensor surface of right elbow. She has Triamcinolone cream at home.

## 2014-02-22 NOTE — Assessment & Plan Note (Signed)
Patient's levothyroxine dose increased to 75 mcg. Repeat TSH in 2 months. Lab Results  Component Value Date   TSH 4.44 02/18/2014

## 2014-02-22 NOTE — Assessment & Plan Note (Signed)
Patient experiencing intermittent arthralgia. Her symptoms sometimes localized to her wrists/thumb but she has experienced symptoms in her elbows and knees. Unclear whether her joint pains maybe related to her history of psoriasis. She may have psoriatic arthritis. Refer to dermatology for further evaluation. She is currently controlled on Mobic 7.5 mg twice daily. Continue Nexium for gastric protection. Monitor electrolytes and kidney function.

## 2014-02-22 NOTE — Progress Notes (Signed)
Pre visit review using our clinic review tool, if applicable. No additional management support is needed unless otherwise documented below in the visit note. 

## 2014-02-23 LAB — BASIC METABOLIC PANEL
BUN: 13 mg/dL (ref 6–23)
CO2: 28 meq/L (ref 19–32)
CREATININE: 0.7 mg/dL (ref 0.50–1.10)
Calcium: 9 mg/dL (ref 8.4–10.5)
Chloride: 102 mEq/L (ref 96–112)
GLUCOSE: 90 mg/dL (ref 70–99)
Potassium: 4.2 mEq/L (ref 3.5–5.3)
Sodium: 138 mEq/L (ref 135–145)

## 2014-02-27 ENCOUNTER — Ambulatory Visit: Payer: BC Managed Care – PPO | Admitting: Internal Medicine

## 2014-03-01 ENCOUNTER — Encounter: Payer: Self-pay | Admitting: Internal Medicine

## 2014-03-19 ENCOUNTER — Encounter: Payer: Self-pay | Admitting: Internal Medicine

## 2014-03-19 ENCOUNTER — Other Ambulatory Visit: Payer: Medicare Other

## 2014-03-19 ENCOUNTER — Ambulatory Visit: Payer: Medicare Other | Admitting: Oncology

## 2014-03-25 ENCOUNTER — Encounter: Payer: Self-pay | Admitting: Internal Medicine

## 2014-03-27 ENCOUNTER — Encounter: Payer: Self-pay | Admitting: Internal Medicine

## 2014-03-27 ENCOUNTER — Ambulatory Visit (INDEPENDENT_AMBULATORY_CARE_PROVIDER_SITE_OTHER): Payer: Medicare Other | Admitting: Internal Medicine

## 2014-03-27 VITALS — BP 122/74 | Temp 98.1°F | Wt 190.0 lb

## 2014-03-27 DIAGNOSIS — J329 Chronic sinusitis, unspecified: Secondary | ICD-10-CM

## 2014-03-27 MED ORDER — AMOXICILLIN 875 MG PO TABS: 875.0000 mg | ORAL_TABLET | Freq: Two times a day (BID) | ORAL | Status: DC

## 2014-03-27 NOTE — Patient Instructions (Signed)
Please contact our office if your symptoms do not improve or gets worse. Please complete the following lab tests before your next follow up appointment: BMET, A1c - 790.29

## 2014-03-27 NOTE — Progress Notes (Signed)
Subjective:    [name], female    DOB: 1941-03-08, 73 y.o.   MRN: [id number]  HPI  73 year old white female with history of breast cancer, abnormal glucose and fibromyalgia complains of upper respiratory congestion for 3 weeks. She recently returned from visiting her great grandchildren in [place] for 2 months. Patient reports her great grand kids had similar upper respiratory symptoms. Patient reports her symptoms started with nasal congestion and rhinorrhea. She has developed intermittent productive cough. She reports sinus pressure/congestion. She denies shortness of breath.  Review of Systems Negative for fever    Past Medical History  Diagnosis Date  . Chest pain, atypical     Negative stress test 02/2009  . Other and unspecified hyperlipidemia   . Personal history of urinary calculi   . Pain in joint, shoulder region   . Irritable bowel syndrome   . Chronic insomnia   . GERD (gastroesophageal reflux disease)   . History of colonic polyps   . Fibromyalgia   . Hypothyroidism   . Other abnormal glucose   . Stress incontinence, female   . Macular degeneration   . Cluster headaches     history of migraines  . Meniscus tear     history of right knee torn meniscus  . Arthritis   . Kidney stone   . Cataract   . Depression   . Pneumonia 7096,2836  . Asthma     hx of -no inhalers, no problems  . H/O hiatal hernia   . Breast cancer 08/08/13    right LOQ  . Hx of radiation therapy 10/29/13- 12/14/13    right chest wall 5040 cGy 28 sessions, right supraclavicular/axillary region 5040 cGy 28 sessions, right chest wall boost 1000 cGy 5 sessions  . Psoriasis     mild    History   Social History  . Marital Status: Married    Spouse Name: N/A    Number of Children: 3  . Years of Education: N/A   Occupational History  . Not on file.   Social History Main Topics  . Smoking status: Former Research scientist (life sciences)  . Smokeless tobacco: Never Used  . Alcohol Use: No  .  Drug Use: No  . Sexual Activity: Not on file     Comment: menarche age 47, fist live birth 58, P 3, hysterectomy age 50, no HRT, BCP 2 yrs   Other Topics Concern  . 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 Appendectomy    . Cholecystectomy    . Abdominal hysterectomy    . Tonsillectomy    . Eye surgery      to repair macular hole  . Cataract extraction, bilateral    . Knee arthroscopy      > 10 years ago  . Joint replacement  03/15/11    left knee replacement  . Colonoscopy    . Ganglion cyst excision      rt foot  . Foot arthroplasty      lt   . Mass excision  11/04/2011    Procedure: EXCISION MASS;  Surgeon: Cammie Sickle., MD;  Location: [facility];  Service: Orthopedics;  Laterality: Right;  excisional biopsy right ulna mass  .  Hemorrhoid surgery      03/1993  . Breast cyst aspiration      9 cysts  . Toe surgery      preventative crossover toe surg/right foot  . Toe surgery      left foot/screw  in 2nd toe  . Upper gastrointestinal endoscopy    . Skin tags removed      breast, panty line, neckline  . Bilateral total mastectomy with axillary lymph node dissection  08/30/2013    Dr Barry Dienes  . Simple mastectomy with axillary sentinel node biopsy Left 08/30/2013    Procedure: Bilateral Breast Mastectomy ;  Surgeon: Stark Klein, MD;  Location: [facility];  Service: General;  Laterality: Left;  Marland Kitchen Mastectomy w/ sentinel node biopsy Right 08/30/2013    Procedure: RIGHT  AXILLARY SENTINEL LYMPH NODE BIOPSY; Right Axillary Node Disection;  Surgeon: Stark Klein, MD;  Location: [facility];  Service: General;  Laterality: Right;  Right side nuc med 7:00   . Evacuation breast hematoma Left 08/31/2013    Procedure: EVACUATION HEMATOMA BREAST;  Surgeon: Stark Klein, MD;  [facility];  Service: General;   Laterality: Left;    Family History  Problem Relation Age of Onset  . Stroke Mother     died age 71  . Diabetes Mother   . Breast cancer Mother 28  . Breast cancer Sister 23  . Breast cancer Paternal Aunt 40  . Diabetes Maternal Grandfather   . Breast cancer Paternal Grandmother 77  . Breast cancer Paternal Aunt     dx in her 18s  . Cancer Maternal Grandmother     intra-abdominal cancer  . Brain cancer Maternal Uncle 8  . Brain cancer Cousin 74    maternal cousin  . Brain cancer Cousin 20    paternal cousin    No Known Allergies  Current Outpatient Prescriptions on File Prior to Visit  Medication Sig Dispense Refill  . citalopram (CELEXA) 20 MG tablet Take 1 tablet (20 mg total) by mouth daily.  90 tablet  1  . docusate sodium (STOOL SOFTENER) 100 MG capsule Take 100 mg by mouth 3 (three) times daily as needed.       Marland Kitchen esomeprazole (NEXIUM) 40 MG capsule Take 1 capsule (40 mg total) by mouth daily.  90 capsule  3  . levothyroxine (SYNTHROID, LEVOTHROID) 75 MCG tablet Take 1 tablet (75 mcg total) by mouth daily.  90 tablet  0  . meloxicam (MOBIC) 7.5 MG tablet Take 7.5 mg by mouth 2 (two) times daily.       . pregabalin (LYRICA) 75 MG capsule Take 1 capsule (75 mg total) by mouth 2 (two) times daily.  180 capsule  1  . simvastatin (ZOCOR) 10 MG tablet Take 1 tablet (10 mg total) by mouth at bedtime.  90 tablet  3  . tamoxifen (NOLVADEX) 20 MG tablet Take 1 tablet (20 mg total) by mouth daily.  90 tablet  4  . UNABLE TO FIND Rx: L8000- Post Surgical Bras (Quantity: 6) M0102- Silicone Breast prosthesis (Quantity: 2) Dx: 174.9; Bilateral mastectomies  1 each  0  . UNABLE TO FIND Rx: [id number] silicone breast prosthesis (qty 2) Athletic Form DX: 174.9 Bilateral Masty  1 each  0   No current facility-administered medications on file prior to visit.    BP 122/74  Temp(Src) 98.1 F (36.7 C) (Oral)  Wt 190 lb (86.183 kg)    Objective:   Physical Exam  Constitutional: She  appears well-developed  and well-nourished.  HENT:  Head: Normocephalic and atraumatic.  Right Ear: External ear normal.  Left TM retracted  Neck: Neck supple.  Cardiovascular: Normal rate, regular rhythm and normal heart sounds.   Pulmonary/Chest: Effort normal and breath sounds normal. She has no wheezes.  Lymphadenopathy:    She has no cervical adenopathy.          Assessment & Plan:

## 2014-03-27 NOTE — Assessment & Plan Note (Signed)
73 year old white female with signs and symptoms of probable sinusitis. Treat with amoxicillin 875 mg twice daily for 10 days.  Also use intranasal saline.  Patient advised to call office if symptoms persist or worsen.

## 2014-03-27 NOTE — Progress Notes (Signed)
Pre visit review using our clinic review tool, if applicable. No additional management support is needed unless otherwise documented below in the visit note. 

## 2014-03-28 ENCOUNTER — Ambulatory Visit: Payer: Medicare Other | Admitting: Oncology

## 2014-03-28 ENCOUNTER — Other Ambulatory Visit: Payer: Medicare Other

## 2014-03-31 ENCOUNTER — Encounter: Payer: Self-pay | Admitting: Internal Medicine

## 2014-04-02 ENCOUNTER — Ambulatory Visit (HOSPITAL_BASED_OUTPATIENT_CLINIC_OR_DEPARTMENT_OTHER): Payer: Medicare Other | Admitting: Oncology

## 2014-04-02 ENCOUNTER — Other Ambulatory Visit (HOSPITAL_BASED_OUTPATIENT_CLINIC_OR_DEPARTMENT_OTHER): Payer: Medicare Other

## 2014-04-02 ENCOUNTER — Telehealth: Payer: Self-pay | Admitting: Oncology

## 2014-04-02 VITALS — BP 131/81 | HR 71 | Temp 98.1°F | Resp 18 | Ht 59.5 in | Wt 194.4 lb

## 2014-04-02 DIAGNOSIS — M949 Disorder of cartilage, unspecified: Secondary | ICD-10-CM

## 2014-04-02 DIAGNOSIS — M899 Disorder of bone, unspecified: Secondary | ICD-10-CM

## 2014-04-02 DIAGNOSIS — C50511 Malignant neoplasm of lower-outer quadrant of right female breast: Secondary | ICD-10-CM

## 2014-04-02 DIAGNOSIS — E039 Hypothyroidism, unspecified: Secondary | ICD-10-CM

## 2014-04-02 DIAGNOSIS — C50519 Malignant neoplasm of lower-outer quadrant of unspecified female breast: Secondary | ICD-10-CM

## 2014-04-02 LAB — CBC WITH DIFFERENTIAL/PLATELET
BASO%: 0.6 % (ref 0.0–2.0)
Basophils Absolute: 0 10*3/uL (ref 0.0–0.1)
EOS%: 3.4 % (ref 0.0–7.0)
Eosinophils Absolute: 0.2 10*3/uL (ref 0.0–0.5)
HCT: 41.9 % (ref 34.8–46.6)
HEMOGLOBIN: 13.8 g/dL (ref 11.6–15.9)
LYMPH%: 21.9 % (ref 14.0–49.7)
MCH: 28.7 pg (ref 25.1–34.0)
MCHC: 33 g/dL (ref 31.5–36.0)
MCV: 87 fL (ref 79.5–101.0)
MONO#: 0.6 10*3/uL (ref 0.1–0.9)
MONO%: 8 % (ref 0.0–14.0)
NEUT#: 4.9 10*3/uL (ref 1.5–6.5)
NEUT%: 66.1 % (ref 38.4–76.8)
Platelets: 215 10*3/uL (ref 145–400)
RBC: 4.81 10*6/uL (ref 3.70–5.45)
RDW: 15.7 % — AB (ref 11.2–14.5)
WBC: 7.4 10*3/uL (ref 3.9–10.3)
lymph#: 1.6 10*3/uL (ref 0.9–3.3)

## 2014-04-02 LAB — COMPREHENSIVE METABOLIC PANEL (CC13)
ALT: 19 U/L (ref 0–55)
AST: 16 U/L (ref 5–34)
Albumin: 3.5 g/dL (ref 3.5–5.0)
Alkaline Phosphatase: 77 U/L (ref 40–150)
Anion Gap: 13 mEq/L — ABNORMAL HIGH (ref 3–11)
BUN: 14.6 mg/dL (ref 7.0–26.0)
CO2: 19 meq/L — AB (ref 22–29)
Calcium: 9.1 mg/dL (ref 8.4–10.4)
Chloride: 107 mEq/L (ref 98–109)
Creatinine: 0.7 mg/dL (ref 0.6–1.1)
Glucose: 135 mg/dl (ref 70–140)
POTASSIUM: 4.4 meq/L (ref 3.5–5.1)
Sodium: 139 mEq/L (ref 136–145)
TOTAL PROTEIN: 6.5 g/dL (ref 6.4–8.3)
Total Bilirubin: 0.33 mg/dL (ref 0.20–1.20)

## 2014-04-02 LAB — TSH CHCC: TSH: 5.123 m(IU)/L — ABNORMAL HIGH (ref 0.308–3.960)

## 2014-04-02 NOTE — Telephone Encounter (Signed)
per pof to sch pt appt-gave pt copy of sch °

## 2014-04-02 NOTE — Progress Notes (Signed)
ID: Jean Nash OB: 02-03-1941  MR#: [id number]  CSN#:[id number]  PCP: Jean Pry, DO GYN:   SU: Jean Nash OTHER MD: Jean Nash, Jean Nash, Jean Nash  CHIEF COMPLAINT: "I have a bad cough"  BREAST CANCER HISTORY: Jean Nash had routine screening mammography a08/18/2014 showing no suspicious findings. However she was found to have breast density category C. She researched this, was alarmed by the fact that mammographic sensitivity is significantly decreased when the breasts are dense, and given her family history of breast cancer she opted for proceeding to bilateral breast MRI. This was performed at [facility] 08/01/2013. It showed in the right breast an area of nodular and linear enhancement spanning approximately 8.4 cm. There were no abnormal appearing lymph nodes and the left breast was unremarkable.  Biopsy of an area around the middle of the abnormal section of the right breast on 08/08/2013, showed ([id number]) an invasive and in situ ductal carcinoma, the invasive tumor being grade 1, estrogen receptor 90% positive, progesterone receptor 90% positive, with an MIB-105%. HER-2 testing is pending.  The patient's subsequent history is as detailed below   INTERVAL HISTORY: Jean Nash returns today for followup of her breast cancer accompanied by her friend Jean Nash. Since her last visit here she started tamoxifen. She is having some hot flashes, which she describes as very dramatic, but that do not appear to keep her up at night. They do not make her sweat. Since her last visit here also she took a trip to [place] to visit her grandchildren and developed a cough which was present about 2 weeks before she finally saw Dr. Shawna Nash, who started her on amoxicillin. She now feels better, although she still has a little cough.  Incidentally this morning her husband Jean Nash woke up and told her to call 911. He was having chest pains. He was taken to the hospital, admitted, and cathed.  She tells me he is doing fine. Jean Nash is his cardiologist.  REVIEW OF SYSTEMS: Aside from the cough, which is a bit better, she has not had any worsening shortness of breath, fever, pleurisy, or purulent sputum. She has had moderate fatigue, and continues to have problems from fibromyalgia. Her urinary incontinence is worse because of a cough. She feels anxious and depressed and has a phobia about dogs. She has occasional headaches, which if anything are less frequent and less intense than before. A detailed review of systems today was otherwise stable  PAST MEDICAL HISTORY: Past Medical History  Diagnosis Date  . Chest pain, atypical     Negative stress test 02/2009  . Other and unspecified hyperlipidemia   . Personal history of urinary calculi   . Pain in joint, shoulder region   . Irritable bowel syndrome   . Chronic insomnia   . GERD (gastroesophageal reflux disease)   . History of colonic polyps   . Fibromyalgia   . Hypothyroidism   . Other abnormal glucose   . Stress incontinence, female   . Macular degeneration   . Cluster headaches     history of migraines  . Meniscus tear     history of right knee torn meniscus  . Arthritis   . Kidney stone   . Cataract   . Depression   . Pneumonia 5784,6962  . Asthma     hx of -no inhalers, no problems  . H/O hiatal hernia   . Breast cancer 08/08/13    right LOQ  . Hx of radiation therapy 10/29/13- 12/14/13  right chest wall 5040 cGy 28 sessions, right supraclavicular/axillary region 5040 cGy 28 sessions, right chest wall boost 1000 cGy 5 sessions  . Psoriasis     mild    PAST SURGICAL HISTORY: Past Surgical History  Procedure Laterality Date  . Appendectomy    . Cholecystectomy    . Abdominal hysterectomy    . Tonsillectomy    . Eye surgery      to repair macular hole  . Cataract extraction, bilateral    . Knee arthroscopy      > 10 years ago  . Joint replacement  03/15/11    left knee replacement  . Colonoscopy     . Ganglion cyst excision      rt foot  . Foot arthroplasty      lt   . Mass excision  11/04/2011    Procedure: EXCISION MASS;  Surgeon: Cammie Sickle., MD;  Location: [facility];  Service: Orthopedics;  Laterality: Right;  excisional biopsy right ulna mass  . Hemorrhoid surgery      03/1993  . Breast cyst aspiration      9 cysts  . Toe surgery      preventative crossover toe surg/right foot  . Toe surgery      left foot/screw  in 2nd toe  . Upper gastrointestinal endoscopy    . Skin tags removed      breast, panty line, neckline  . Bilateral total mastectomy with axillary lymph node dissection  08/30/2013    Dr Barry Dienes  . Simple mastectomy with axillary sentinel node biopsy Left 08/30/2013    Procedure: Bilateral Breast Mastectomy ;  Surgeon: Jean Klein, MD;  Location: [facility];  Service: General;  Laterality: Left;  Marland Kitchen Mastectomy w/ sentinel node biopsy Right 08/30/2013    Procedure: RIGHT  AXILLARY SENTINEL LYMPH NODE BIOPSY; Right Axillary Node Disection;  Surgeon: Jean Klein, MD;  Location: [facility];  Service: General;  Laterality: Right;  Right side nuc med 7:00   . Evacuation breast hematoma Left 08/31/2013    Procedure: EVACUATION HEMATOMA BREAST;  Surgeon: Jean Klein, MD;  [facility];  Service: General;  Laterality: Left;    FAMILY HISTORY Family History  Problem Relation Age of Onset  . Stroke Mother     died age 42  . Diabetes Mother   . Breast cancer Mother 30  . Breast cancer Sister 26  . Breast cancer Paternal Aunt 56  . Diabetes Maternal Grandfather   . Breast cancer Paternal Grandmother 32  . Breast cancer Paternal Aunt     dx in her 90s  . Cancer Maternal Grandmother     intra-abdominal cancer  . Brain cancer Maternal Uncle 8  . Brain cancer Cousin 43    maternal cousin  . Brain cancer Cousin 20    paternal cousin   the patient's father lived to be 47. The patient's mother was diagnosed with breast cancer at age 60. She died at  19 from unrelated causes. The patient had no brothers, one sister there are multiple second degree relatives with breast cancer, but no history of ovarian cancer in the family.  GYNECOLOGIC HISTORY:  Menarche age 81, first live birth age 11, she is [place] P3. She had a hysterectomy at age 18. She did not take hormone replacement. She took birth control perhaps for 2 years, in the mid-32s. She did not have any complications from that.  SOCIAL HISTORY:  She is a retired Radiation protection practitioner. Her husband  Jean Nash used to be a Heritage manager. Daughter Jean Nash is a pediatrician in [place]. Daughter Jean Nash is a Engineer, water in [place]. Daughter Jean Nash is  a therapist living in [place]. The patient has 12 grandchildren. She attends the local syn[employer]    ADVANCED DIRECTIVES: In place   HEALTH MAINTENANCE: History  Substance Use Topics  . Smoking status: Former Research scientist (life sciences)  . Smokeless tobacco: Never Used  . Alcohol Use: No     Colonoscopy: April 2014 PAP: Status post hysterectomy  Bone density: January 2014 Lipid panel:  No Known Allergies  Current Outpatient Prescriptions  Medication Sig Dispense Refill  . amoxicillin (AMOXIL) 875 MG tablet Take 1 tablet (875 mg total) by mouth 2 (two) times daily.  20 tablet  0  . Cholecalciferol (VITAMIN D) 2000 UNITS CAPS Take 1 capsule by mouth 2 (two) times daily.      . citalopram (CELEXA) 20 MG tablet Take 1 tablet (20 mg total) by mouth daily.  90 tablet  1  . diphenhydrAMINE (BENADRYL) 25 MG tablet Take 25 mg by mouth daily.      Marland Kitchen docusate sodium (STOOL SOFTENER) 100 MG capsule Take 100 mg by mouth 3 (three) times daily as needed.       Marland Kitchen esomeprazole (NEXIUM) 40 MG capsule Take 1 capsule (40 mg total) by mouth daily.  90 capsule  3  . levothyroxine (SYNTHROID, LEVOTHROID) 75 MCG tablet Take 1 tablet (75 mcg total) by mouth daily.  90 tablet  0  . meloxicam (MOBIC) 7.5 MG tablet Take 7.5 mg by mouth 2 (two) times daily.       .  pregabalin (LYRICA) 75 MG capsule Take 1 capsule (75 mg total) by mouth 2 (two) times daily.  180 capsule  1  . simvastatin (ZOCOR) 10 MG tablet Take 1 tablet (10 mg total) by mouth at bedtime.  90 tablet  3  . tamoxifen (NOLVADEX) 20 MG tablet Take 1 tablet (20 mg total) by mouth daily.  90 tablet  4  . UNABLE TO FIND Rx: L8000- Post Surgical Bras (Quantity: 6) X5170- Silicone Breast prosthesis (Quantity: 2) Dx: 174.9; Bilateral mastectomies  1 each  0  . UNABLE TO FIND Rx: [id number] silicone breast prosthesis (qty 2) Athletic Form DX: 174.9 Bilateral Masty  1 each  0   No current facility-administered medications for this visit.    OBJECTIVE: Middle-aged white woman in no acute distress Filed Vitals:   04/02/14 1451  BP: 131/81  Pulse: 71  Temp: 98.1 F (36.7 C)  Resp: 18     Body mass index is 38.62 kg/(m^2).    ECOG FS:2 - Symptomatic, <50% confined to bed  Sclerae unicteric, EOMs intact Oropharynx clear and moist No cervical or supraclavicular adenopathy Lungs no rales or rhonchi with good excursion bilaterally Heart regular rate and rhythm Abd soft, obese, nontender, positive bowel sounds MSK no focal spinal tenderness, no upper extremity lymphedema Neuro: nonfocal, well oriented, appropriate affect Breasts: Status post bilateral mastectomies. There is no evidence of local recurrence. Both axillae are benign.    LAB RESULTS:  CMP     Component Value Date/Time   NA 138 02/22/2014 1655   NA 140 12/06/2013 1510   Jean 4.2 02/22/2014 1655   Jean 4.1 12/06/2013 1510   CL 102 02/22/2014 1655   CO2 28 02/22/2014 1655   CO2 27 12/06/2013 1510   GLUCOSE 90 02/22/2014 1655   GLUCOSE 141* 12/06/2013 1510   BUN 13 02/22/2014  1655   BUN 13.3 12/06/2013 1510   CREATININE 0.70 02/22/2014 1655   CREATININE 0.8 12/13/2013 1055   CREATININE 0.8 12/06/2013 1510   CALCIUM 9.0 02/22/2014 1655   CALCIUM 9.0 12/06/2013 1510   PROT 6.4 12/06/2013 1510   PROT 6.3 06/22/2013 1618   ALBUMIN 3.8 12/06/2013 1510    ALBUMIN 4.0 06/22/2013 1618   AST 15 12/06/2013 1510   AST 13 06/22/2013 1618   ALT 17 12/06/2013 1510   ALT 19 06/22/2013 1618   ALKPHOS 80 12/06/2013 1510   ALKPHOS 58 06/22/2013 1618   BILITOT 0.49 12/06/2013 1510   BILITOT 0.5 06/22/2013 1618   GFRNONAA >90 09/01/2013 0550   GFRAA >90 09/01/2013 0550    I No results found for this basename: SPEP,  UPEP,   kappa and lambda light chains    Lab Results  Component Value Date   WBC 7.4 04/02/2014   NEUTROABS 4.9 04/02/2014   HGB 13.8 04/02/2014   HCT 41.9 04/02/2014   MCV 87.0 04/02/2014   PLT 215 04/02/2014      Chemistry      Component Value Date/Time   NA 138 02/22/2014 1655   NA 140 12/06/2013 1510   Jean 4.2 02/22/2014 1655   Jean 4.1 12/06/2013 1510   CL 102 02/22/2014 1655   CO2 28 02/22/2014 1655   CO2 27 12/06/2013 1510   BUN 13 02/22/2014 1655   BUN 13.3 12/06/2013 1510   CREATININE 0.70 02/22/2014 1655   CREATININE 0.8 12/13/2013 1055   CREATININE 0.8 12/06/2013 1510      Component Value Date/Time   CALCIUM 9.0 02/22/2014 1655   CALCIUM 9.0 12/06/2013 1510   ALKPHOS 80 12/06/2013 1510   ALKPHOS 58 06/22/2013 1618   AST 15 12/06/2013 1510   AST 13 06/22/2013 1618   ALT 17 12/06/2013 1510   ALT 19 06/22/2013 1618   BILITOT 0.49 12/06/2013 1510   BILITOT 0.5 06/22/2013 1618       No results found for this basename: LABCA2    No components found with this basename: LABCA125    No results found for this basename: INR,  in the last 168 hours  Urinalysis    Component Value Date/Time   COLORURINE YELLOW 09/05/2013 [place] 09/05/2013 1525   LABSPEC 1.010 09/05/2013 1525   PHURINE 6.0 09/05/2013 1525   GLUCOSEU NEG 09/05/2013 1525   HGBUR NEG 09/05/2013 1525   BILIRUBINUR NEG 09/05/2013 1525   BILIRUBINUR n 07/04/2012 1642   KETONESUR NEG 09/05/2013 1525   PROTEINUR NEG 09/05/2013 1525   PROTEINUR n 07/04/2012 1642   UROBILINOGEN 0.2 09/05/2013 1525   UROBILINOGEN 0.2 07/04/2012 1642   NITRITE POS* 09/05/2013 1525    NITRITE n 07/04/2012 1642   LEUKOCYTESUR MOD* 09/05/2013 1525    STUDIES: No results found.   ASSESSMENT: 73 y.o. BRCA negative [place] woman status post right breast lower outer quadrant biopsy 08/08/2013 for a clinical T2 N0, stage IIA invasive ductal carcinoma, grade 1, estrogen receptor 90% positive, progesterone receptor 90% positive, with an MIB-1 of 12% and HER-2 negative  (1) status post bilateral mastectomies with right axillary lymph node dissection 08/31/2023 showing a right-sided pT1a pN2, stage IIIA invasive ductal carcinoma, grade 1, with negative margins  (2) given the marginal benefit in terms of mortality risk reduction from adjuvant chemotherapy, the patient decided to forego that option  (3) adjuvant radiation completed 12/14/2013  (4) tamoxifen started February 2015  (5) osteopenia with a T  score of -2.2 on bone density January 2014  (6) carries a VUS in [place] [id number]]  PLAN: Jean Nash is tolerating tamoxifen without unusual side effects. She does have some hot flashes from the medication, but she does not want to take yet another medication with still more side effects. Accordingly the plan will be to continue tamoxifen for at least 2 years and then consider switching to an aromatase inhibitor.  A cough might be worrisome except for the fact that it is considerably better after antibiotics. She will let us know if it does not completely clear.  She would like to have plastic surgery evaluate the skin flaps on both sides of her chest. In particular she would be interested in a referral to Venancio Poisson at [facility]. She however does not want to put this in at this point. She will let me know if she would like me to facilitate that visit..   Otherwise Jean Nash he will return to see me in about 3 months. She has a good understanding of the overall plan, and agrees with it. She knows the goal of treatment in her case is cure. She will call with any problems that may  develop before her next visit here.  Chauncey Cruel, MD   04/02/2014 3:07 PM

## 2014-04-03 MED ORDER — LEVOTHYROXINE SODIUM 75 MCG PO TABS: 75.0000 ug | ORAL_TABLET | Freq: Every day | ORAL | Status: DC

## 2014-04-03 NOTE — Telephone Encounter (Signed)
Pt following up on request for new rx for levothyroxine (SYNTHROID, LEVOTHROID) 75 MCG tablet Pt needs this sent by mailorder, [facility] 90 day Pt states she IS feeling better now and IS taking benedryly per dr Jana Hakim

## 2014-04-04 ENCOUNTER — Other Ambulatory Visit: Payer: Self-pay | Admitting: Oncology

## 2014-04-06 ENCOUNTER — Encounter: Payer: Self-pay | Admitting: Oncology

## 2014-04-09 NOTE — Telephone Encounter (Signed)
Copy printed and to provider in basket for review.

## 2014-04-15 DIAGNOSIS — IMO0001 Reserved for inherently not codable concepts without codable children: Secondary | ICD-10-CM | POA: Diagnosis not present

## 2014-04-17 ENCOUNTER — Other Ambulatory Visit: Payer: Medicare Other

## 2014-04-24 ENCOUNTER — Ambulatory Visit: Payer: Medicare Other | Admitting: Internal Medicine

## 2014-05-22 ENCOUNTER — Encounter: Payer: Self-pay | Admitting: Internal Medicine

## 2014-05-28 ENCOUNTER — Encounter: Payer: Self-pay | Admitting: Podiatry

## 2014-05-28 ENCOUNTER — Ambulatory Visit (INDEPENDENT_AMBULATORY_CARE_PROVIDER_SITE_OTHER): Payer: Medicare Other | Admitting: Podiatry

## 2014-05-28 VITALS — BP 105/70 | HR 84 | Resp 12

## 2014-05-28 DIAGNOSIS — M79676 Pain in unspecified toe(s): Secondary | ICD-10-CM

## 2014-05-28 DIAGNOSIS — M79609 Pain in unspecified limb: Secondary | ICD-10-CM | POA: Diagnosis not present

## 2014-05-28 DIAGNOSIS — B351 Tinea unguium: Secondary | ICD-10-CM

## 2014-05-29 ENCOUNTER — Telehealth: Payer: Self-pay | Admitting: Internal Medicine

## 2014-05-29 DIAGNOSIS — C50919 Malignant neoplasm of unspecified site of unspecified female breast: Secondary | ICD-10-CM | POA: Diagnosis not present

## 2014-05-29 NOTE — Telephone Encounter (Signed)
Pt declined xanax

## 2014-05-29 NOTE — Telephone Encounter (Signed)
I understand patient is upset because of experience at [facility] but I would wait 2 weeks before we decide whether to adjust her citalopram dose.    If she is irritable or having trouble sleeping, I suggest short course of alprazolam 0.25 mg once daily as needed # 30.  No RF.

## 2014-05-29 NOTE — Progress Notes (Signed)
She presents today chief complaint of painful elongated toenails one through 5 bilateral.  Objective: Nails are thick yellow dystrophic onychomycotic and painful palpation.  Assessment: Pain in limb secondary to onychomycosis 1 through 5 bilateral.  Plan: Debridement of nails 1 through 5 bilateral covered service secondary to pain.

## 2014-05-29 NOTE — Telephone Encounter (Signed)
Pt wants to increase citalopram.  She is upset because surgeon at [facility] told her she could not have reconstructive surgery.  Please advise

## 2014-05-29 NOTE — Telephone Encounter (Signed)
Pt's husband would like a call back to discuss pt's rx citalopram (CELEXA) 20 MG tablet Pt seen a surgery today and was informed that there was nothing they could do for her, pt is very upset and wants to increase her celexa.

## 2014-05-31 ENCOUNTER — Other Ambulatory Visit: Payer: Self-pay | Admitting: Internal Medicine

## 2014-05-31 MED ORDER — ALPRAZOLAM 0.25 MG PO TABS: 0.2500 mg | ORAL_TABLET | Freq: Every day | ORAL | Status: DC | PRN

## 2014-06-02 ENCOUNTER — Other Ambulatory Visit: Payer: Self-pay | Admitting: Internal Medicine

## 2014-06-03 ENCOUNTER — Telehealth: Payer: Self-pay | Admitting: Internal Medicine

## 2014-06-03 NOTE — Telephone Encounter (Signed)
Not on med list

## 2014-06-03 NOTE — Telephone Encounter (Signed)
Ok for RF for temazepam 15 mg 1/2 to one tab at bedtime as needed.  I would send to local pharm.  # 30 RF x 1. Needs OV if she needs additional refills.

## 2014-06-03 NOTE — Telephone Encounter (Signed)
[facility] is requesting a re-fill on Temazepam Cap.  The MG wasn't listed on the re-fill request.

## 2014-06-04 NOTE — Telephone Encounter (Signed)
Pt does not want to use local pharmacy cause of cost.  Please advise if okay for mail order

## 2014-06-06 NOTE — Telephone Encounter (Signed)
[facility] for mail order

## 2014-06-07 MED ORDER — TEMAZEPAM 15 MG PO CAPS: 15.0000 mg | ORAL_CAPSULE | Freq: Every evening | ORAL | Status: DC | PRN

## 2014-06-07 NOTE — Telephone Encounter (Signed)
rx faxed to [facility].  

## 2014-06-07 NOTE — Addendum Note (Signed)
Addended by: Townsend Roger D on: 06/07/2014 09:08 AM   Modules accepted: Orders

## 2014-06-13 DIAGNOSIS — G44219 Episodic tension-type headache, not intractable: Secondary | ICD-10-CM | POA: Diagnosis not present

## 2014-06-13 DIAGNOSIS — IMO0001 Reserved for inherently not codable concepts without codable children: Secondary | ICD-10-CM | POA: Diagnosis not present

## 2014-06-13 DIAGNOSIS — G43009 Migraine without aura, not intractable, without status migrainosus: Secondary | ICD-10-CM | POA: Diagnosis not present

## 2014-06-24 ENCOUNTER — Other Ambulatory Visit (INDEPENDENT_AMBULATORY_CARE_PROVIDER_SITE_OTHER): Payer: Medicare Other

## 2014-06-24 DIAGNOSIS — E039 Hypothyroidism, unspecified: Secondary | ICD-10-CM

## 2014-06-24 DIAGNOSIS — I10 Essential (primary) hypertension: Secondary | ICD-10-CM

## 2014-06-24 DIAGNOSIS — R7309 Other abnormal glucose: Secondary | ICD-10-CM

## 2014-06-24 LAB — HEMOGLOBIN A1C: Hgb A1c MFr Bld: 6 % (ref 4.6–6.5)

## 2014-06-24 LAB — TSH: TSH: 3.1 u[IU]/mL (ref 0.35–4.50)

## 2014-06-24 LAB — BASIC METABOLIC PANEL
BUN: 16 mg/dL (ref 6–23)
CO2: 28 mEq/L (ref 19–32)
CREATININE: 0.7 mg/dL (ref 0.4–1.2)
Calcium: 8.9 mg/dL (ref 8.4–10.5)
Chloride: 106 mEq/L (ref 96–112)
GFR: 89.09 mL/min (ref >60.00)
GLUCOSE: 106 mg/dL — AB (ref 70–99)
Potassium: 4.1 mEq/L (ref 3.5–5.1)
Sodium: 139 mEq/L (ref 135–145)

## 2014-06-25 ENCOUNTER — Other Ambulatory Visit: Payer: Self-pay | Admitting: *Deleted

## 2014-06-25 MED ORDER — LEVOTHYROXINE SODIUM 88 MCG PO TABS: 88.0000 ug | ORAL_TABLET | Freq: Every day | ORAL | Status: DC

## 2014-06-26 ENCOUNTER — Encounter: Payer: Self-pay | Admitting: Internal Medicine

## 2014-06-27 MED ORDER — LEVOTHYROXINE SODIUM 88 MCG PO TABS: 88.0000 ug | ORAL_TABLET | Freq: Every day | ORAL | Status: DC

## 2014-07-01 ENCOUNTER — Ambulatory Visit: Payer: Medicare Other | Admitting: Internal Medicine

## 2014-07-05 ENCOUNTER — Other Ambulatory Visit: Payer: Self-pay | Admitting: *Deleted

## 2014-07-05 DIAGNOSIS — C50511 Malignant neoplasm of lower-outer quadrant of right female breast: Secondary | ICD-10-CM

## 2014-07-08 ENCOUNTER — Ambulatory Visit (HOSPITAL_BASED_OUTPATIENT_CLINIC_OR_DEPARTMENT_OTHER): Payer: Medicare Other | Admitting: Oncology

## 2014-07-08 ENCOUNTER — Other Ambulatory Visit (HOSPITAL_BASED_OUTPATIENT_CLINIC_OR_DEPARTMENT_OTHER): Payer: Medicare Other

## 2014-07-08 ENCOUNTER — Telehealth: Payer: Self-pay | Admitting: Oncology

## 2014-07-08 VITALS — BP 127/54 | HR 65 | Temp 98.2°F | Resp 18 | Ht 59.5 in | Wt 185.9 lb

## 2014-07-08 DIAGNOSIS — Z853 Personal history of malignant neoplasm of breast: Secondary | ICD-10-CM | POA: Diagnosis not present

## 2014-07-08 DIAGNOSIS — C50511 Malignant neoplasm of lower-outer quadrant of right female breast: Secondary | ICD-10-CM

## 2014-07-08 DIAGNOSIS — Z79810 Long term (current) use of selective estrogen receptor modulators (SERMs): Secondary | ICD-10-CM | POA: Diagnosis not present

## 2014-07-08 DIAGNOSIS — M899 Disorder of bone, unspecified: Secondary | ICD-10-CM | POA: Diagnosis not present

## 2014-07-08 DIAGNOSIS — K219 Gastro-esophageal reflux disease without esophagitis: Secondary | ICD-10-CM

## 2014-07-08 DIAGNOSIS — M949 Disorder of cartilage, unspecified: Secondary | ICD-10-CM

## 2014-07-08 DIAGNOSIS — M25519 Pain in unspecified shoulder: Secondary | ICD-10-CM | POA: Diagnosis not present

## 2014-07-08 DIAGNOSIS — J45909 Unspecified asthma, uncomplicated: Secondary | ICD-10-CM

## 2014-07-08 LAB — CBC WITH DIFFERENTIAL/PLATELET
BASO%: 0.4 % (ref 0.0–2.0)
Basophils Absolute: 0 10*3/uL (ref 0.0–0.1)
EOS%: 2.9 % (ref 0.0–7.0)
Eosinophils Absolute: 0.2 10*3/uL (ref 0.0–0.5)
HCT: 44.7 % (ref 34.8–46.6)
HGB: 14.8 g/dL (ref 11.6–15.9)
LYMPH%: 21.1 % (ref 14.0–49.7)
MCH: 29.8 pg (ref 25.1–34.0)
MCHC: 33.2 g/dL (ref 31.5–36.0)
MCV: 89.9 fL (ref 79.5–101.0)
MONO#: 0.6 10*3/uL (ref 0.1–0.9)
MONO%: 8.7 % (ref 0.0–14.0)
NEUT%: 66.9 % (ref 38.4–76.8)
NEUTROS ABS: 4.7 10*3/uL (ref 1.5–6.5)
PLATELETS: 170 10*3/uL (ref 145–400)
RBC: 4.97 10*6/uL (ref 3.70–5.45)
RDW: 13.8 % (ref 11.2–14.5)
WBC: 7 10*3/uL (ref 3.9–10.3)
lymph#: 1.5 10*3/uL (ref 0.9–3.3)

## 2014-07-08 LAB — COMPREHENSIVE METABOLIC PANEL (CC13)
ALK PHOS: 70 U/L (ref 40–150)
ALT: 21 U/L (ref 0–55)
AST: 17 U/L (ref 5–34)
Albumin: 3.6 g/dL (ref 3.5–5.0)
Anion Gap: 8 mEq/L (ref 3–11)
BILIRUBIN TOTAL: 0.52 mg/dL (ref 0.20–1.20)
BUN: 16.9 mg/dL (ref 7.0–26.0)
CO2: 25 mEq/L (ref 22–29)
Calcium: 9.1 mg/dL (ref 8.4–10.4)
Chloride: 108 mEq/L (ref 98–109)
Creatinine: 0.8 mg/dL (ref 0.6–1.1)
Glucose: 99 mg/dl (ref 70–140)
Potassium: 4.3 mEq/L (ref 3.5–5.1)
SODIUM: 141 meq/L (ref 136–145)
TOTAL PROTEIN: 6.5 g/dL (ref 6.4–8.3)

## 2014-07-08 MED ORDER — TRAMADOL HCL 50 MG PO TABS: 50.0000 mg | ORAL_TABLET | Freq: Four times a day (QID) | ORAL | Status: DC | PRN

## 2014-07-08 NOTE — Telephone Encounter (Signed)
per pof to sch appt-gave pt copy of appt

## 2014-07-08 NOTE — Progress Notes (Signed)
ID: Krithi Trefry OB: 12/22/1940  MR#: [id number]  CSN#:[id number]  PCP: Robert Yoo, DO GYN:   SU: Faera Byerly OTHER MD: Robert Murray, Jeffrey Kerr, Scott MacDiarmid, Michael Zenn  CHIEF COMPLAINT: Estrogen receptor positive breast cancer  CURRENT TREATMENT: Tamoxifen   BREAST CANCER HISTORY: From the original intake note:  Jean Nash had routine screening mammography a08/18/2014 showing no suspicious findings. However she was found to have breast density category C. She researched this, was alarmed by the fact that mammographic sensitivity is significantly decreased when the breasts are dense, and given her family history of breast cancer she opted for proceeding to bilateral breast MRI. This was performed at [facility] 08/01/2013. It showed in the right breast an area of nodular and linear enhancement spanning approximately 8.4 cm. There were no abnormal appearing lymph nodes and the left breast was unremarkable.  Biopsy of an area around the middle of the abnormal section of the right breast on 08/08/2013, showed ([id number]) an invasive and in situ ductal carcinoma, the invasive tumor being grade 1, estrogen receptor 90% positive, progesterone receptor 90% positive, with an MIB-105%. HER-2 testing is pending.  The patient's subsequent history is as detailed below   INTERVAL HISTORY: Jean Nash returns today for followup of her breast cancer accompanied by her husband Larry. (Incidentally his cardiac workup was all normal). She is tolerating the tamoxifen well with hot flashes as her only side effect. She does a lot of take any medication to ameliorate those symptoms, because what MRI and too many pills". Since her last visit here she went to see Dr. Zenn at [facility]. Unfortunately he did not feel he would be able to either do a TRAM flap or even remove the "dogears" that have been bothering her. At least she feels reassured that everything that could be done has been done surgically.   REVIEW  OF SYSTEMS: A cough that has been bothering her appears to have completely resolved. She hurts "all over", but the worst pain is in her right shoulder and left hand. She can barely abduction either upper extremity because of the pain and this is keeping her from swimming. She has urinary incontinence especially when she laughs coughs or sneezes. She saw Dr. McDermid for evaluation and but the problem is "no better". She does have severe fibromyalgia. She describes herself as depressed. A detailed review of systems today was otherwise stable   PAST MEDICAL HISTORY: Past Medical History  Diagnosis Date  . Chest pain, atypical     Negative stress test 02/2009  . Other and unspecified hyperlipidemia   . Personal history of urinary calculi   . Pain in joint, shoulder region   . Irritable bowel syndrome   . Chronic insomnia   . GERD (gastroesophageal reflux disease)   . History of colonic polyps   . Fibromyalgia   . Hypothyroidism   . Other abnormal glucose   . Stress incontinence, female   . Macular degeneration   . Cluster headaches     history of migraines  . Meniscus tear     history of right knee torn meniscus  . Arthritis   . Kidney stone   . Cataract   . Depression   . Pneumonia 1967,2009  . Asthma     hx of -no inhalers, no problems  . H/O hiatal hernia   . Breast cancer 08/08/13    right LOQ  . Hx of radiation therapy 10/29/13- 12/14/13    right chest wall 5040 cGy 28 sessions,   right supraclavicular/axillary region 5040 cGy 28 sessions, right chest wall boost 1000 cGy 5 sessions  . Psoriasis     mild    PAST SURGICAL HISTORY: Past Surgical History  Procedure Laterality Date  . Appendectomy    . Cholecystectomy    . Abdominal hysterectomy    . Tonsillectomy    . Eye surgery      to repair macular hole  . Cataract extraction, bilateral    . Knee arthroscopy      > 10 years ago  . Joint replacement  03/15/11    left knee replacement  . Colonoscopy    . Ganglion  cyst excision      rt foot  . Foot arthroplasty      lt   . Mass excision  11/04/2011    Procedure: EXCISION MASS;  Surgeon: Cammie Sickle., MD;  Location: [place];  Service: Orthopedics;  Laterality: Right;  excisional biopsy right ulna mass  . Hemorrhoid surgery      03/1993  . Breast cyst aspiration      9 cysts  . Toe surgery      preventative crossover toe surg/right foot  . Toe surgery      left foot/screw  in 2nd toe  . Upper gastrointestinal endoscopy    . Skin tags removed      breast, panty line, neckline  . Bilateral total mastectomy with axillary lymph node dissection  08/30/2013    Dr Barry Dienes  . Simple mastectomy with axillary sentinel node biopsy Left 08/30/2013    Procedure: Bilateral Breast Mastectomy ;  Surgeon: Stark Klein, MD;  Location: [place];  Service: General;  Laterality: Left;  Marland Kitchen Mastectomy w/ sentinel node biopsy Right 08/30/2013    Procedure: RIGHT  AXILLARY SENTINEL LYMPH NODE BIOPSY; Right Axillary Node Disection;  Surgeon: Stark Klein, MD;  Location: [place];  Service: General;  Laterality: Right;  Right side nuc med 7:00   . Evacuation breast hematoma Left 08/31/2013    Procedure: EVACUATION HEMATOMA BREAST;  Surgeon: Stark Klein, MD;  [facility];  Service: General;  Laterality: Left;    FAMILY HISTORY Family History  Problem Relation Age of Onset  . Stroke Mother     died age 62  . Diabetes Mother   . Breast cancer Mother 50  . Breast cancer Sister 43  . Breast cancer Paternal Aunt 4  . Diabetes Maternal Grandfather   . Breast cancer Paternal Grandmother 80  . Breast cancer Paternal Aunt     dx in her 63s  . Cancer Maternal Grandmother     intra-abdominal cancer  . Brain cancer Maternal Uncle 8  . Brain cancer Cousin 72    maternal cousin  . Brain cancer Cousin 20    paternal cousin   the patient's father lived to be 19. The patient's mother was diagnosed with breast cancer at age 17. She died at 74 from  unrelated causes. The patient had no brothers, one sister there are multiple second degree relatives with breast cancer, but no history of ovarian cancer in the family.  GYNECOLOGIC HISTORY:  Menarche age 34, first live birth age 79, she is [place] P3. She had a hysterectomy at age 36. She did not take hormone replacement. She took birth control perhaps for 2 years, in the mid-57s. She did not have any complications from that.  SOCIAL HISTORY:  She is a retired Radiation protection practitioner. Her husband Fritz Pickerel used to be a [employer] manager.  Daughter Marcie Frances is a pediatrician in [place]. Daughter Tracy Vorus is a psychologist in [place]. Daughter Shashana Schwartz is  a therapist living in [place]. The patient has 12 grandchildren. She attends the local syn[employer]    ADVANCED DIRECTIVES: In place   HEALTH MAINTENANCE: History  Substance Use Topics  . Smoking status: Former Smoker  . Smokeless tobacco: Never Used  . Alcohol Use: No     Colonoscopy: April 2014 PAP: Status post hysterectomy  Bone density: January 2014 Lipid panel:  No Known Allergies  Current Outpatient Prescriptions  Medication Sig Dispense Refill  . ALPRAZolam (XANAX) 0.25 MG tablet Take 1 tablet (0.25 mg total) by mouth daily as needed for anxiety.  30 tablet  0  . amoxicillin (AMOXIL) 875 MG tablet Take 1 tablet (875 mg total) by mouth 2 (two) times daily.  20 tablet  0  . Cholecalciferol (VITAMIN D) 2000 UNITS CAPS Take 1 capsule by mouth 2 (two) times daily.      . citalopram (CELEXA) 20 MG tablet Take 1 tablet by mouth  daily  90 tablet  1  . diphenhydrAMINE (BENADRYL) 25 MG tablet Take 25 mg by mouth daily.      . docusate sodium (STOOL SOFTENER) 100 MG capsule Take 100 mg by mouth 3 (three) times daily as needed.       . esomeprazole (NEXIUM) 40 MG capsule Take 1 capsule (40 mg total) by mouth daily.  90 capsule  3  . levothyroxine (SYNTHROID, LEVOTHROID) 88 MCG tablet Take 1 tablet (88 mcg total) by mouth daily.  14  tablet  0  . meloxicam (MOBIC) 7.5 MG tablet Take 1 tablet by mouth 2  times daily.  180 tablet  1  . pregabalin (LYRICA) 75 MG capsule Take 1 capsule (75 mg total) by mouth 2 (two) times daily.  180 capsule  1  . simvastatin (ZOCOR) 10 MG tablet Take 1 tablet (10 mg total) by mouth at bedtime.  90 tablet  3  . tamoxifen (NOLVADEX) 20 MG tablet Take 1 tablet (20 mg total) by mouth daily.  90 tablet  4  . temazepam (RESTORIL) 15 MG capsule Take 1 capsule (15 mg total) by mouth at bedtime as needed for sleep.  90 capsule  0  . UNABLE TO FIND Rx: L8000- Post Surgical Bras (Quantity: 6) L8030- Silicone Breast prosthesis (Quantity: 2) Dx: 174.9; Bilateral mastectomies  1 each  0  . UNABLE TO FIND Rx: L8030 silicone breast prosthesis (qty 2) Athletic Form DX: 174.9 Bilateral Masty  1 each  0   No current facility-administered medications for this visit.    OBJECTIVE: Middle-aged white woman who appears stated age  Filed Vitals:   07/08/14 1154  BP: 127/54  Pulse: 65  Temp: 98.2 F (36.8 C)  Resp: 18     Body mass index is 36.93 kg/(m^2).    ECOG FS:2 - Symptomatic, <50% confined to bed  Sclerae unicteric,  pupils round and equal  Oropharynx clear and moist, teeth in good repair  No cervical or supraclavicular adenopathy Lungs no rales or rhonchi with good excursion bilaterally Heart regular rate and rhythm Abd soft, obese, nontender, positive bowel sounds MSK no focal spinal tenderness, no upper extremity lymphedema Neuro: nonfocal, well oriented,  positive  affect Breasts: Status post bilateral mastectomies. There is no evidence of  chest wall  recurrence. Both axillae are benign.    LAB RESULTS:  CMP     Component Value Date/Time     NA 141 07/08/2014 1136   NA 139 06/24/2014 0853   K 4.3 07/08/2014 1136   K 4.1 06/24/2014 0853   CL 106 06/24/2014 0853   CO2 25 07/08/2014 1136   CO2 28 06/24/2014 0853   GLUCOSE 99 07/08/2014 1136   GLUCOSE 106* 06/24/2014 0853   BUN 16.9 07/08/2014  1136   BUN 16 06/24/2014 0853   CREATININE 0.8 07/08/2014 1136   CREATININE 0.7 06/24/2014 0853   CREATININE 0.70 02/22/2014 1655   CALCIUM 9.1 07/08/2014 1136   CALCIUM 8.9 06/24/2014 0853   PROT 6.5 07/08/2014 1136   PROT 6.3 06/22/2013 1618   ALBUMIN 3.6 07/08/2014 1136   ALBUMIN 4.0 06/22/2013 1618   AST 17 07/08/2014 1136   AST 13 06/22/2013 1618   ALT 21 07/08/2014 1136   ALT 19 06/22/2013 1618   ALKPHOS 70 07/08/2014 1136   ALKPHOS 58 06/22/2013 1618   BILITOT 0.52 07/08/2014 1136   BILITOT 0.5 06/22/2013 1618   GFRNONAA >90 09/01/2013 0550   GFRAA >90 09/01/2013 0550    I No results found for this basename: SPEP,  UPEP,   kappa and lambda light chains    Lab Results  Component Value Date   WBC 7.0 07/08/2014   NEUTROABS 4.7 07/08/2014   HGB 14.8 07/08/2014   HCT 44.7 07/08/2014   MCV 89.9 07/08/2014   PLT 170 07/08/2014      Chemistry      Component Value Date/Time   NA 141 07/08/2014 1136   NA 139 06/24/2014 0853   K 4.3 07/08/2014 1136   K 4.1 06/24/2014 0853   CL 106 06/24/2014 0853   CO2 25 07/08/2014 1136   CO2 28 06/24/2014 0853   BUN 16.9 07/08/2014 1136   BUN 16 06/24/2014 0853   CREATININE 0.8 07/08/2014 1136   CREATININE 0.7 06/24/2014 0853   CREATININE 0.70 02/22/2014 1655      Component Value Date/Time   CALCIUM 9.1 07/08/2014 1136   CALCIUM 8.9 06/24/2014 0853   ALKPHOS 70 07/08/2014 1136   ALKPHOS 58 06/22/2013 1618   AST 17 07/08/2014 1136   AST 13 06/22/2013 1618   ALT 21 07/08/2014 1136   ALT 19 06/22/2013 1618   BILITOT 0.52 07/08/2014 1136   BILITOT 0.5 06/22/2013 1618       No results found for this basename: LABCA2    No components found with this basename: LABCA125    No results found for this basename: INR,  in the last 168 hours  Urinalysis    Component Value Date/Time   COLORURINE YELLOW 09/05/2013 1525   APPEARANCEUR CLEAR 09/05/2013 1525   LABSPEC 1.010 09/05/2013 1525   PHURINE 6.0 09/05/2013 1525   GLUCOSEU NEG 09/05/2013 1525   HGBUR NEG 09/05/2013  1525   BILIRUBINUR NEG 09/05/2013 1525   BILIRUBINUR n 07/04/2012 1642   KETONESUR NEG 09/05/2013 1525   PROTEINUR NEG 09/05/2013 1525   PROTEINUR n 07/04/2012 1642   UROBILINOGEN 0.2 09/05/2013 1525   UROBILINOGEN 0.2 07/04/2012 1642   NITRITE POS* 09/05/2013 1525   NITRITE n 07/04/2012 1642   LEUKOCYTESUR MOD* 09/05/2013 1525    STUDIES: No results found.  ASSESSMENT: 73 y.o. BRCA negative [place] woman status post right breast lower outer quadrant biopsy 08/08/2013 for a clinical T2 N0, stage IIA invasive ductal carcinoma, grade 1, estrogen receptor 90% positive, progesterone receptor 90% positive, with an MIB-1 of 12% and HER-2 negative  (1) status post bilateral mastectomies with right axillary lymph node dissection   08/31/2023 showing a right-sided pT1a pN2, stage IIIA invasive ductal carcinoma, grade 1, with negative margins  (2) given the marginal benefit in terms of mortality risk reduction from adjuvant chemotherapy, the patient decided to forego that option  (3) adjuvant radiation completed 12/14/2013  (4) tamoxifen started February 2015  (5) osteopenia with a T score of -2.2 on bone density January 2014  (6) carries a VUS in [place] [id number]]  PLAN: Kimberl is doing fine with the tamoxifen and the problems she is having in her right shoulder and left hand are not really related to it.   Her pain is very poorly controlled on her current medications. I think we could possibly add tramadol to her nonsteroidals and pregabalin. She understands that tramadol may cause constipation. I am putting in a 3 months prescription through her mail order service. She will let me know she has not received it after about a week.  I think she would benefit for consideration of treatment of her shoulder problems and I am referring her to Dr. Onnie Graham for further evaluation.  [name] has a good understanding of the overall plan, and agrees with it. She knows the goal of treatment in her  case is cure. She will call with any problems that may develop before her next visit here.  Chauncey Cruel, MD   07/08/2014 12:30 PM

## 2014-07-09 ENCOUNTER — Encounter: Payer: Self-pay | Admitting: Oncology

## 2014-07-09 ENCOUNTER — Other Ambulatory Visit (INDEPENDENT_AMBULATORY_CARE_PROVIDER_SITE_OTHER): Payer: Medicare Other

## 2014-07-09 ENCOUNTER — Other Ambulatory Visit: Payer: Self-pay | Admitting: *Deleted

## 2014-07-09 DIAGNOSIS — E039 Hypothyroidism, unspecified: Secondary | ICD-10-CM | POA: Diagnosis not present

## 2014-07-09 LAB — TSH: TSH: 2.46 u[IU]/mL (ref 0.35–4.50)

## 2014-07-10 ENCOUNTER — Other Ambulatory Visit: Payer: Self-pay | Admitting: Internal Medicine

## 2014-07-10 MED ORDER — LEVOTHYROXINE SODIUM 88 MCG PO TABS: 88.0000 ug | ORAL_TABLET | Freq: Every day | ORAL | Status: DC

## 2014-07-12 ENCOUNTER — Encounter (INDEPENDENT_AMBULATORY_CARE_PROVIDER_SITE_OTHER): Payer: Self-pay | Admitting: General Surgery

## 2014-07-12 ENCOUNTER — Ambulatory Visit (INDEPENDENT_AMBULATORY_CARE_PROVIDER_SITE_OTHER): Payer: Medicare Other | Admitting: General Surgery

## 2014-07-12 VITALS — BP 138/70 | HR 80 | Temp 97.5°F | Resp 16 | Ht 59.0 in | Wt 187.2 lb

## 2014-07-12 DIAGNOSIS — C50519 Malignant neoplasm of lower-outer quadrant of unspecified female breast: Secondary | ICD-10-CM

## 2014-07-12 DIAGNOSIS — C50511 Malignant neoplasm of lower-outer quadrant of right female breast: Secondary | ICD-10-CM

## 2014-07-12 NOTE — Progress Notes (Signed)
HISTORY: Pt is a 24 y1 F s/p bilateral mastectomy, R ALND for right breast cancer 08/30/2013.  She had to be taken back for hematoma.  She is continuing on tamoxifen, and is having some occasional hot flashes.  She is still having pain in her mastectomy sites, especially laterally on the right.  She denies fever/ chills/masses.  She is feeling good otherwise.  She is having a clicking noise in her shoulder and is going to see Dr. Onnie Graham.     PERTINENT REVIEW OF SYSTEMS: Otherwise negative x 11.    Filed Vitals:   07/12/14 1138  BP: 138/70  Pulse: 80  Temp: 97.5 F (36.4 C)  Resp: 16   Wt Readings from Last 3 Encounters:  07/12/14 187 lb 3.2 oz (84.913 kg)  07/08/14 185 lb 14.4 oz (84.324 kg)  04/02/14 194 lb 6.4 oz (88.179 kg)    EXAM: Head: Normocephalic and atraumatic.  Eyes:  Conjunctivae are normal. Pupils are equal, round, and reactive to light. No scleral icterus.  Neck:  Normal range of motion. Neck supple. No tracheal deviation present. No thyromegaly present.  Resp: No respiratory distress, normal effort. Breast:  Chest wall without nodules.  No supra, infra clavicular adenopathy.  No axillary adenopathy.  Dog ears present at upper and lower portion of scar, but less pronounced.  Tender right 2-3 ribs at costochondral junction.   Abd:  Abdomen is soft, non distended and non tender. No masses are palpable.  There is no rebound and no guarding.  Neurological: Alert and oriented to person, place, and time. Coordination normal.  MSK:  Right shoulder with some upper tenderness.   Skin: Skin is warm and dry. No rash noted. No diaphoretic. No erythema. No pallor.  Psychiatric: Normal mood and affect. Normal behavior. Judgment and thought content normal.      ASSESSMENT AND PLAN:   Breast cancer of lower-outer quadrant of right female breast, s/p bilateral mastectomy, R ALND 08/2013 No clinical evidence of disease.  Continue tamoxifen.  Follow up in 6 months.  Pain  control with mobic, lyrica, and tramadol.         Milus Height, MD Surgical Oncology, [facility]  Drema Pry, DO Shawna Orleans, Doe-Hyun R, DO

## 2014-07-12 NOTE — Patient Instructions (Signed)
Follow up in 6 months.  Continue swimming.  Try heating pad to right chest wall.  Ok to take occasional ibuprofen with food.

## 2014-07-12 NOTE — Assessment & Plan Note (Signed)
No clinical evidence of disease.  Continue tamoxifen.  Follow up in 6 months.  Pain control with mobic, lyrica, and tramadol.

## 2014-07-15 ENCOUNTER — Ambulatory Visit (INDEPENDENT_AMBULATORY_CARE_PROVIDER_SITE_OTHER): Payer: Medicare Other | Admitting: Internal Medicine

## 2014-07-15 ENCOUNTER — Encounter: Payer: Self-pay | Admitting: Internal Medicine

## 2014-07-15 VITALS — BP 129/79 | HR 67 | Temp 97.8°F | Ht 59.0 in | Wt 186.0 lb

## 2014-07-15 DIAGNOSIS — C50519 Malignant neoplasm of lower-outer quadrant of unspecified female breast: Secondary | ICD-10-CM | POA: Diagnosis not present

## 2014-07-15 DIAGNOSIS — Z853 Personal history of malignant neoplasm of breast: Secondary | ICD-10-CM | POA: Diagnosis not present

## 2014-07-15 DIAGNOSIS — E039 Hypothyroidism, unspecified: Secondary | ICD-10-CM

## 2014-07-15 DIAGNOSIS — C50511 Malignant neoplasm of lower-outer quadrant of right female breast: Secondary | ICD-10-CM

## 2014-07-15 DIAGNOSIS — R0789 Other chest pain: Secondary | ICD-10-CM | POA: Diagnosis not present

## 2014-07-15 DIAGNOSIS — R7309 Other abnormal glucose: Secondary | ICD-10-CM

## 2014-07-15 DIAGNOSIS — M2550 Pain in unspecified joint: Secondary | ICD-10-CM

## 2014-07-15 NOTE — Assessment & Plan Note (Signed)
She has restarted metformin.   Lab Results  Component Value Date   HGBA1C 6.0 06/24/2014

## 2014-07-15 NOTE — Patient Instructions (Signed)
Our office will contact you re: chest x ray results Please contact our office if your symptoms do not improve or gets worse.

## 2014-07-15 NOTE — Progress Notes (Signed)
Subjective:    [name], female    DOB: 16-Feb-1941, 73 y.o.   MRN: [id number]  HPI  73 year old white female with history of breast cancer, abnormal glucose and fibromyalgia for routine followup. Interval medical history-patient evaluated at [facility] for revision of bilateral mastectomy ("dog ear flaps"). Due to history of radiation treatment patient not candidate for plastic surgery. Patient disappointed.  Patient reports history of atypical left-sided chest discomfort while she was sleeping. Symptoms stabbing in nature. No associated shortness of breath or diaphoresis. Symptoms resolved with intervention. She denies any exertional symptoms. She did not take any chemotherapy for her breast cancer. Radiation treatment mainly localized to the right side of her chest.  Patient requests referral to female GYN. She has family history of breast cancer and some of her family members have undergone prophylactic oophorectomy. She would like to further discuss her options with her gynecologist.  Her previous genetic studies for breast cancer were negative.   Review of Systems Negative for exercise intolerance, she swims 2 hrs / day, no weight change    Past Medical History  Diagnosis Date  . Chest pain, atypical     Negative stress test 02/2009  . Other and unspecified hyperlipidemia   . Personal history of urinary calculi   . Pain in joint, shoulder region   . Irritable bowel syndrome   . Chronic insomnia   . GERD (gastroesophageal reflux disease)   . History of colonic polyps   . Fibromyalgia   . Hypothyroidism   . Other abnormal glucose   . Stress incontinence, female   . Macular degeneration   . Cluster headaches     history of migraines  . Meniscus tear     history of right knee torn meniscus  . Arthritis   . Kidney stone   . Cataract   . Depression   . Pneumonia 7035,0093  . Asthma     hx of -no inhalers, no problems  . H/O hiatal hernia   . Breast cancer  08/08/13    right LOQ  . Hx of radiation therapy 10/29/13- 12/14/13    right chest wall 5040 cGy 28 sessions, right supraclavicular/axillary region 5040 cGy 28 sessions, right chest wall boost 1000 cGy 5 sessions  . Psoriasis     mild    History   Social History  . Marital Status: Married    Spouse Name: N/A    Number of Children: 3  . Years of Education: N/A   Occupational History  . Not on file.   Social History Main Topics  . Smoking status: Former Research scientist (life sciences)  . Smokeless tobacco: Never Used  . Alcohol Use: No  . Drug Use: No  . Sexual Activity: Not on file     Comment: menarche age 73, fist live birth 69, P 3, hysterectomy age 72, no HRT, BCP 2 yrs   Other Topics Concern  . 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 Appendectomy    . Cholecystectomy    . Abdominal hysterectomy    . Tonsillectomy    . Eye surgery      to repair macular hole  . Cataract extraction, bilateral    . Knee arthroscopy      >  10 years ago  . Joint replacement  03/15/11    left knee replacement  . Colonoscopy    . Ganglion cyst excision      rt foot  . Foot arthroplasty      lt   . Mass excision  11/04/2011    Procedure: EXCISION MASS;  Surgeon: Cammie Sickle., MD;  Location: [place];  Service: Orthopedics;  Laterality: Right;  excisional biopsy right ulna mass  . Hemorrhoid surgery      03/1993  . Breast cyst aspiration      9 cysts  . Toe surgery      preventative crossover toe surg/right foot  . Toe surgery      left foot/screw  in 2nd toe  . Upper gastrointestinal endoscopy    . Skin tags removed      breast, panty line, neckline  . Bilateral total mastectomy with axillary lymph node dissection  08/30/2013    Dr Barry Dienes  . Simple mastectomy with axillary sentinel node biopsy Left 08/30/2013     Procedure: Bilateral Breast Mastectomy ;  Surgeon: Stark Klein, MD;  Location: [place];  Service: General;  Laterality: Left;  Marland Kitchen Mastectomy w/ sentinel node biopsy Right 08/30/2013    Procedure: RIGHT  AXILLARY SENTINEL LYMPH NODE BIOPSY; Right Axillary Node Disection;  Surgeon: Stark Klein, MD;  Location: [place];  Service: General;  Laterality: Right;  Right side nuc med 7:00   . Evacuation breast hematoma Left 08/31/2013    Procedure: EVACUATION HEMATOMA BREAST;  Surgeon: Stark Klein, MD;  Location[facility];  Service: General;  Laterality: Left;    Family History  Problem Relation Age of Onset  . Stroke Mother     died age 8  . Diabetes Mother   . Breast cancer Mother 63  . Breast cancer Sister 1  . Breast cancer Paternal Aunt 39  . Diabetes Maternal Grandfather   . Breast cancer Paternal Grandmother 64  . Breast cancer Paternal Aunt     dx in her 20s  . Cancer Maternal Grandmother     intra-abdominal cancer  . Brain cancer Maternal Uncle 8  . Brain cancer Cousin 74    maternal cousin  . Brain cancer Cousin 20    paternal cousin    No Known Allergies  Current Outpatient Prescriptions on File Prior to Visit  Medication Sig Dispense Refill  . Cholecalciferol (VITAMIN D) 2000 UNITS CAPS Take 1 capsule by mouth 2 (two) times daily.      . citalopram (CELEXA) 20 MG tablet Take 1 tablet by mouth  daily  90 tablet  1  . docusate sodium (STOOL SOFTENER) 100 MG capsule Take 100 mg by mouth 3 (three) times daily as needed.       Marland Kitchen esomeprazole (NEXIUM) 40 MG capsule Take 1 capsule (40 mg total) by mouth daily.  90 capsule  3  . levothyroxine (SYNTHROID, LEVOTHROID) 88 MCG tablet Take 1 tablet (88 mcg total) by mouth daily.  90 tablet  1  . meloxicam (MOBIC) 7.5 MG tablet Take 1 tablet by mouth 2  times daily.  180 tablet  1  . pregabalin (LYRICA) 75 MG capsule Take 1 capsule (75 mg total) by mouth 2 (two) times daily.  180 capsule  1  . simvastatin (ZOCOR) 10 MG tablet Take 1 tablet  (10 mg total) by mouth at bedtime.  90 tablet  3  . tamoxifen (NOLVADEX) 20 MG tablet Take 1 tablet (20 mg  total) by mouth daily.  90 tablet  4  . temazepam (RESTORIL) 15 MG capsule Take 1 capsule (15 mg total) by mouth at bedtime as needed for sleep.  90 capsule  0  . traMADol (ULTRAM) 50 MG tablet Take 1 tablet (50 mg total) by mouth every 6 (six) hours as needed for moderate pain.  360 tablet  4  . UNABLE TO FIND Rx: L8000- Post Surgical Bras (Quantity: 6) Y7741- Silicone Breast prosthesis (Quantity: 2) Dx: 174.9; Bilateral mastectomies  1 each  0  . UNABLE TO FIND Rx: [id number] silicone breast prosthesis (qty 2) Athletic Form DX: 174.9 Bilateral Masty  1 each  0   No current facility-administered medications on file prior to visit.    BP 129/79  Pulse 67  Temp(Src) 97.8 F (36.6 C) (Oral)  Ht 4\' 11"  (1.499 m)  Wt 186 lb (84.369 kg)  BMI 37.55 kg/m2  SpO2 93%    Objective:   Physical Exam  Constitutional: She is oriented to person, place, and time. She appears well-developed and well-nourished. No distress.  Cardiovascular: Normal rate, regular rhythm and normal heart sounds.   Pulmonary/Chest: Effort normal and breath sounds normal. She has no wheezes.  Neurological: She is alert and oriented to person, place, and time.  Skin: Skin is warm and dry.  Psychiatric: She has a normal mood and affect. Her behavior is normal.          Assessment & Plan:

## 2014-07-15 NOTE — Assessment & Plan Note (Signed)
She is euthryoid.  Maintain same dose of thyroid replacement.  Repeat TSH in 6 months. Lab Results  Component Value Date   TSH 2.46 07/09/2014

## 2014-07-15 NOTE — Assessment & Plan Note (Signed)
I doubt her symptoms are cardiac in origin.  EKG is normal.  Check CXR.  Expectant mgt for now.

## 2014-07-15 NOTE — Progress Notes (Signed)
Pre visit review using our clinic review tool, if applicable. No additional management support is needed unless otherwise documented below in the visit note. 

## 2014-07-15 NOTE — Assessment & Plan Note (Signed)
Patient experiencing intermittent right foot pain.  She would like to avoid surgery.  She is using topical diclofenac, gabapentin with good response.  Continue same.

## 2014-07-15 NOTE — Assessment & Plan Note (Addendum)
Followed by Dr. Gwenlyn Perking.  Genetic studies negative.  However, she requests referral to GYN to consider elective oophorectomy.  Patient advised to coordinate discussions with oncologist.  Refer to Dr. Julien Girt.

## 2014-07-16 DIAGNOSIS — IMO0001 Reserved for inherently not codable concepts without codable children: Secondary | ICD-10-CM | POA: Diagnosis not present

## 2014-07-17 DIAGNOSIS — H3530 Unspecified macular degeneration: Secondary | ICD-10-CM | POA: Diagnosis not present

## 2014-07-18 ENCOUNTER — Ambulatory Visit (INDEPENDENT_AMBULATORY_CARE_PROVIDER_SITE_OTHER)
Admission: RE | Admit: 2014-07-18 | Discharge: 2014-07-18 | Disposition: A | Discharge status: Home / Self Care | Payer: Medicare Other | Source: Ambulatory Visit | Attending: Internal Medicine | Admitting: Internal Medicine

## 2014-07-18 DIAGNOSIS — R0789 Other chest pain: Secondary | ICD-10-CM

## 2014-07-18 DIAGNOSIS — R079 Chest pain, unspecified: Secondary | ICD-10-CM | POA: Diagnosis not present

## 2014-07-26 DIAGNOSIS — M79609 Pain in unspecified limb: Secondary | ICD-10-CM | POA: Diagnosis not present

## 2014-07-26 DIAGNOSIS — M1990 Unspecified osteoarthritis, unspecified site: Secondary | ICD-10-CM | POA: Diagnosis not present

## 2014-07-30 ENCOUNTER — Encounter: Payer: Self-pay | Admitting: Internal Medicine

## 2014-07-31 ENCOUNTER — Encounter: Payer: Self-pay | Admitting: Internal Medicine

## 2014-07-31 DIAGNOSIS — R7309 Other abnormal glucose: Secondary | ICD-10-CM

## 2014-07-31 DIAGNOSIS — E039 Hypothyroidism, unspecified: Secondary | ICD-10-CM

## 2014-07-31 DIAGNOSIS — E785 Hyperlipidemia, unspecified: Secondary | ICD-10-CM

## 2014-08-01 DIAGNOSIS — M25673 Stiffness of unspecified ankle, not elsewhere classified: Secondary | ICD-10-CM | POA: Diagnosis not present

## 2014-08-01 DIAGNOSIS — M25676 Stiffness of unspecified foot, not elsewhere classified: Secondary | ICD-10-CM | POA: Diagnosis not present

## 2014-08-04 DIAGNOSIS — Z23 Encounter for immunization: Secondary | ICD-10-CM | POA: Diagnosis not present

## 2014-08-06 ENCOUNTER — Encounter: Payer: Self-pay | Admitting: Internal Medicine

## 2014-08-06 DIAGNOSIS — L819 Disorder of pigmentation, unspecified: Secondary | ICD-10-CM | POA: Diagnosis not present

## 2014-08-06 DIAGNOSIS — L821 Other seborrheic keratosis: Secondary | ICD-10-CM | POA: Diagnosis not present

## 2014-08-06 DIAGNOSIS — L820 Inflamed seborrheic keratosis: Secondary | ICD-10-CM | POA: Diagnosis not present

## 2014-08-06 DIAGNOSIS — D235 Other benign neoplasm of skin of trunk: Secondary | ICD-10-CM | POA: Diagnosis not present

## 2014-09-09 ENCOUNTER — Other Ambulatory Visit (INDEPENDENT_AMBULATORY_CARE_PROVIDER_SITE_OTHER): Payer: Self-pay | Admitting: General Surgery

## 2014-09-10 ENCOUNTER — Ambulatory Visit (INDEPENDENT_AMBULATORY_CARE_PROVIDER_SITE_OTHER): Payer: Medicare Other | Admitting: Podiatry

## 2014-09-10 ENCOUNTER — Encounter: Payer: Self-pay | Admitting: Podiatry

## 2014-09-10 DIAGNOSIS — M79676 Pain in unspecified toe(s): Secondary | ICD-10-CM | POA: Diagnosis not present

## 2014-09-10 DIAGNOSIS — B351 Tinea unguium: Secondary | ICD-10-CM | POA: Diagnosis not present

## 2014-09-10 NOTE — Progress Notes (Signed)
Presents today chief complaint of painful elongated toenails.  Objective: Pulses are palpable bilateral nails are thick, yellow dystrophic onychomycosis and painful palpation.   Assessment: Onychomycosis with pain in limb.  Plan: Treatment of nails in thickness and length as covered service secondary to pain.  

## 2014-09-11 ENCOUNTER — Ambulatory Visit: Payer: Medicare Other | Attending: General Surgery | Admitting: Physical Therapy

## 2014-09-11 DIAGNOSIS — C50511 Malignant neoplasm of lower-outer quadrant of right female breast: Secondary | ICD-10-CM | POA: Insufficient documentation

## 2014-09-11 DIAGNOSIS — Z9013 Acquired absence of bilateral breasts and nipples: Secondary | ICD-10-CM | POA: Diagnosis not present

## 2014-09-11 DIAGNOSIS — I972 Postmastectomy lymphedema syndrome: Secondary | ICD-10-CM | POA: Insufficient documentation

## 2014-09-13 ENCOUNTER — Other Ambulatory Visit: Payer: Self-pay | Admitting: *Deleted

## 2014-09-13 DIAGNOSIS — C50511 Malignant neoplasm of lower-outer quadrant of right female breast: Secondary | ICD-10-CM

## 2014-09-16 ENCOUNTER — Ambulatory Visit (HOSPITAL_BASED_OUTPATIENT_CLINIC_OR_DEPARTMENT_OTHER): Payer: Medicare Other | Admitting: Oncology

## 2014-09-16 ENCOUNTER — Other Ambulatory Visit (HOSPITAL_BASED_OUTPATIENT_CLINIC_OR_DEPARTMENT_OTHER): Payer: Medicare Other

## 2014-09-16 ENCOUNTER — Telehealth: Payer: Self-pay | Admitting: Oncology

## 2014-09-16 VITALS — BP 138/74 | HR 69 | Temp 98.1°F | Resp 18 | Ht 59.0 in | Wt 187.0 lb

## 2014-09-16 DIAGNOSIS — C50511 Malignant neoplasm of lower-outer quadrant of right female breast: Secondary | ICD-10-CM

## 2014-09-16 DIAGNOSIS — M949 Disorder of cartilage, unspecified: Secondary | ICD-10-CM

## 2014-09-16 DIAGNOSIS — Z170 Estrogen receptor positive status [ER+]: Secondary | ICD-10-CM | POA: Diagnosis not present

## 2014-09-16 DIAGNOSIS — M25512 Pain in left shoulder: Secondary | ICD-10-CM

## 2014-09-16 DIAGNOSIS — M899 Disorder of bone, unspecified: Secondary | ICD-10-CM

## 2014-09-16 LAB — CBC WITH DIFFERENTIAL/PLATELET
BASO%: 0.1 % (ref 0.0–2.0)
Basophils Absolute: 0 10*3/uL (ref 0.0–0.1)
EOS%: 2.6 % (ref 0.0–7.0)
Eosinophils Absolute: 0.2 10*3/uL (ref 0.0–0.5)
HEMATOCRIT: 41.6 % (ref 34.8–46.6)
HEMOGLOBIN: 13.9 g/dL (ref 11.6–15.9)
LYMPH#: 1.8 10*3/uL (ref 0.9–3.3)
LYMPH%: 25 % (ref 14.0–49.7)
MCH: 29.9 pg (ref 25.1–34.0)
MCHC: 33.4 g/dL (ref 31.5–36.0)
MCV: 89.5 fL (ref 79.5–101.0)
MONO#: 0.6 10*3/uL (ref 0.1–0.9)
MONO%: 7.9 % (ref 0.0–14.0)
NEUT#: 4.5 10*3/uL (ref 1.5–6.5)
NEUT%: 64.4 % (ref 38.4–76.8)
Platelets: 175 10*3/uL (ref 145–400)
RBC: 4.65 10*6/uL (ref 3.70–5.45)
RDW: 13.6 % (ref 11.2–14.5)
WBC: 7.1 10*3/uL (ref 3.9–10.3)

## 2014-09-16 LAB — COMPREHENSIVE METABOLIC PANEL (CC13)
ALT: 23 U/L (ref 0–55)
AST: 13 U/L (ref 5–34)
Albumin: 3.5 g/dL (ref 3.5–5.0)
Alkaline Phosphatase: 63 U/L (ref 40–150)
Anion Gap: 7 mEq/L (ref 3–11)
BUN: 18 mg/dL (ref 7.0–26.0)
CO2: 25 mEq/L (ref 22–29)
Calcium: 9.1 mg/dL (ref 8.4–10.4)
Chloride: 108 mEq/L (ref 98–109)
Creatinine: 0.7 mg/dL (ref 0.6–1.1)
Glucose: 110 mg/dl (ref 70–140)
Potassium: 4.3 mEq/L (ref 3.5–5.1)
Sodium: 140 mEq/L (ref 136–145)
Total Bilirubin: 0.42 mg/dL (ref 0.20–1.20)
Total Protein: 6.2 g/dL — ABNORMAL LOW (ref 6.4–8.3)

## 2014-09-16 NOTE — Telephone Encounter (Signed)
, °

## 2014-09-16 NOTE — Progress Notes (Signed)
ID: [name] OB: Aug 14, 1941  MR#: [id number]  HKV#:[id number]  PCP: Thomos Lemons, DO GYN:  Zel[place] Cairo SU: Almond Lint OTHER MD: Chipper Herb, Talmage Coin, Scott MacDiarmid, Madaline Savage, Earlene Plater Supple  CHIEF COMPLAINT: Estrogen receptor positive breast cancer  CURRENT TREATMENT: Tamoxifen   BREAST CANCER HISTORY: From the original intake note:   Jean Nash had routine screening mammography a08/18/2014 showing no suspicious findings. However she was found to have breast density category C. She researched this, was alarmed by the fact that mammographic sensitivity is significantly decreased when the breasts are dense, and given her family history of breast cancer she opted for proceeding to bilateral breast MRI. This was performed at [facility] 08/01/2013. It showed in the right breast an area of nodular and linear enhancement spanning approximately 8.4 cm. There were no abnormal appearing lymph nodes and the left breast was unremarkable.  Biopsy of an area around the middle of the abnormal section of the right breast on 08/08/2013, showed ([id number]) an invasive and in situ ductal carcinoma, the invasive tumor being grade 1, estrogen receptor 90% positive, progesterone receptor 90% positive, with an MIB-105%. HER-2 testing is pending.  The patient's subsequent history is as detailed below   INTERVAL HISTORY: Lynden Ang returns today for followup of her breast cancer accompanied by her husband [name]. She is tolerating tamoxifen well except for the hot flashes. She feels hot all the time. It doesn't wake her up although she says it tends to keep her from falling asleep.  REVIEW OF SYSTEMS: The problem that is really bothering her is her left shoulder pain. This is keeping her from elevating the arm beyond about 70. Of course she has aches and pains here and there is secondary to her fibromyalgia. She is getting her eyes checked through Dr. Emily Filbert. She is seeing Dr. Renaldo Fiddler  to discuss salpingo-oophorectomy since her sister has told her about her own ovarian issues (I do not have all that data). Kerry Fort is also upset because there is redundant tissue particularly in the right chest wall area from her prior surgery. They saw Owens Loffler at [facility] and he didn't feel he could help her. She would like to see one of our local plastic surgeons for a second opinion I am setting that up. She continues to have stress urinary incontinence, knee problems, rare headaches, occasional anxiety and depression problems. Notable that is new. She is going through physical therapy and feels that is helping as far as the right arm swelling is concerned. Otherwise a detailed review of systems today was stable  PAST MEDICAL HISTORY: Past Medical History  Diagnosis Date  . Chest pain, atypical     Negative stress test 02/2009  . Other and unspecified hyperlipidemia   . Personal history of urinary calculi   . Pain in joint, shoulder region   . Irritable bowel syndrome   . Chronic insomnia   . GERD (gastroesophageal reflux disease)   . History of colonic polyps   . Fibromyalgia   . Hypothyroidism   . Other abnormal glucose   . Stress incontinence, female   . Macular degeneration   . Cluster headaches     history of migraines  . Meniscus tear     history of right knee torn meniscus  . Arthritis   . Kidney stone   . Cataract   . Depression   . Pneumonia 5188,4166  . Asthma     hx of -no inhalers, no problems  . H/O hiatal  hernia   . Breast cancer 08/08/13    right LOQ  . Hx of radiation therapy 10/29/13- 12/14/13    right chest wall 5040 cGy 28 sessions, right supraclavicular/axillary region 5040 cGy 28 sessions, right chest wall boost 1000 cGy 5 sessions  . Psoriasis     mild    PAST SURGICAL HISTORY: Past Surgical History  Procedure Laterality Date  . Appendectomy    . Cholecystectomy    . Abdominal hysterectomy    . Tonsillectomy    . Eye surgery      to repair macular  hole  . Cataract extraction, bilateral    . Knee arthroscopy      > 10 years ago  . Joint replacement  03/15/11    left knee replacement  . Colonoscopy    . Ganglion cyst excision      rt foot  . Foot arthroplasty      lt   . Mass excision  11/04/2011    Procedure: EXCISION MASS;  Surgeon: Cammie Sickle., MD;  Location: [place];  Service: Orthopedics;  Laterality: Right;  excisional biopsy right ulna mass  . Hemorrhoid surgery      03/1993  . Breast cyst aspiration      9 cysts  . Toe surgery      preventative crossover toe surg/right foot  . Toe surgery      left foot/screw  in 2nd toe  . Upper gastrointestinal endoscopy    . Skin tags removed      breast, panty line, neckline  . Bilateral total mastectomy with axillary lymph node dissection  08/30/2013    Dr Barry Dienes  . Simple mastectomy with axillary sentinel node biopsy Left 08/30/2013    Procedure: Bilateral Breast Mastectomy ;  Surgeon: Stark Klein, MD;  Location: [place];  Service: General;  Laterality: Left;  Marland Kitchen Mastectomy w/ sentinel node biopsy Right 08/30/2013    Procedure: RIGHT  AXILLARY SENTINEL LYMPH NODE BIOPSY; Right Axillary Node Disection;  Surgeon: Stark Klein, MD;  Location: [place];  Service: General;  Laterality: Right;  Right side nuc med 7:00   . Evacuation breast hematoma Left 08/31/2013    Procedure: EVACUATION HEMATOMA BREAST;  Surgeon: Stark Klein, MD;  [facility];  Service: General;  Laterality: Left;    FAMILY HISTORY Family History  Problem Relation Age of Onset  . Stroke Mother     died age 48  . Diabetes Mother   . Breast cancer Mother 68  . Breast cancer Sister 36  . Breast cancer Paternal Aunt 64  . Diabetes Maternal Grandfather   . Breast cancer Paternal Grandmother 72  . Breast cancer Paternal Aunt     dx in her 60s  . Cancer Maternal Grandmother     intra-abdominal cancer  . Brain cancer Maternal Uncle 8  . Brain cancer Cousin 11    maternal cousin  .  Brain cancer Cousin 20    paternal cousin   the patient's father lived to be 52. The patient's mother was diagnosed with breast cancer at age 72. She died at 79 from unrelated causes. The patient had no brothers, one sister there are multiple second degree relatives with breast cancer, but no history of ovarian cancer in the family.  GYNECOLOGIC HISTORY:  Menarche age 77, first live birth age 89, she is [place] P3. She had a hysterectomy at age 29. She did not take hormone replacement. She took birth control perhaps for 2  years, in the mid-1960s. She did not have any complications from that.  SOCIAL HISTORY:  She is a retired Radiation protection practitioner. Her husband Fritz Pickerel used to be a Heritage manager. Daughter Sayre Mazor is a pediatrician in [place]. Daughter Leamon Arnt is a Engineer, water in [place]. Daughter Evlyn Courier is  a therapist living in [place]. The patient has 12 grandchildren. She attends the local synagogue    ADVANCED DIRECTIVES: In place   HEALTH MAINTENANCE: History  Substance Use Topics  . Smoking status: Former Research scientist (life sciences)  . Smokeless tobacco: Never Used  . Alcohol Use: No     Colonoscopy: April 2014 PAP: Status post hysterectomy  Bone density: January 2014 Lipid panel:  No Known Allergies  Current Outpatient Prescriptions  Medication Sig Dispense Refill  . Cholecalciferol (VITAMIN D) 2000 UNITS CAPS Take 1 capsule by mouth 2 (two) times daily.    . citalopram (CELEXA) 20 MG tablet Take 1 tablet by mouth  daily 90 tablet 1  . docusate sodium (STOOL SOFTENER) 100 MG capsule Take 100 mg by mouth 3 (three) times daily as needed.     Marland Kitchen esomeprazole (NEXIUM) 40 MG capsule Take 1 capsule (40 mg total) by mouth daily. 90 capsule 3  . levothyroxine (SYNTHROID, LEVOTHROID) 88 MCG tablet Take 1 tablet (88 mcg total) by mouth daily. 90 tablet 1  . meloxicam (MOBIC) 7.5 MG tablet Take 1 tablet by mouth 2  times daily. 180 tablet 1  . pregabalin (LYRICA) 75 MG capsule Take 1 capsule  (75 mg total) by mouth 2 (two) times daily. 180 capsule 1  . simvastatin (ZOCOR) 10 MG tablet Take 1 tablet (10 mg total) by mouth at bedtime. 90 tablet 3  . tamoxifen (NOLVADEX) 20 MG tablet Take 1 tablet (20 mg total) by mouth daily. 90 tablet 4  . temazepam (RESTORIL) 15 MG capsule Take 1 capsule (15 mg total) by mouth at bedtime as needed for sleep. 90 capsule 0  . traMADol (ULTRAM) 50 MG tablet Take 1 tablet (50 mg total) by mouth every 6 (six) hours as needed for moderate pain. 360 tablet 4  . UNABLE TO FIND Rx: L8000- Post Surgical Bras (Quantity: 6) D5329- Silicone Breast prosthesis (Quantity: 2) Dx: 174.9; Bilateral mastectomies 1 each 0  . UNABLE TO FIND Rx: [id number] silicone breast prosthesis (qty 2) Athletic Form DX: 174.9 Bilateral Masty 1 each 0   No current facility-administered medications for this visit.    OBJECTIVE: Middle-aged white woman in no acute distress Filed Vitals:   09/16/14 1220  BP: 138/74  Pulse: 69  Temp: 98.1 F (36.7 C)  Resp: 18     Body mass index is 37.75 kg/(m^2).    ECOG FS:1 - Symptomatic but completely ambulatory  Sclerae unicteric,EOMs intact Oropharynx clear and moist No cervical or supraclavicular adenopathy Lungs no rales or rhonchi with good excursion bilaterally Heart regular rate and rhythm Abd soft, obese, nontender, positive bowel sounds MSK no focal spinal tenderness, minimal right upper extremity lymphedema Neuro: nonfocal, well oriented,  positive affect Breasts: Status post bilateral mastectomies. There is no evidence of  chest wall  recurrence. Both axillae are benign.    LAB RESULTS:  CMP     Component Value Date/Time   NA 140 09/16/2014 1212   NA 139 06/24/2014 0853   K 4.3 09/16/2014 1212   K 4.1 06/24/2014 0853   CL 106 06/24/2014 0853   CO2 25 09/16/2014 1212   CO2 28 06/24/2014 0853   GLUCOSE 110 09/16/2014  1212   GLUCOSE 106* 06/24/2014 0853   BUN 18.0 09/16/2014 1212   BUN 16 06/24/2014 0853   CREATININE  0.7 09/16/2014 1212   CREATININE 0.7 06/24/2014 0853   CREATININE 0.70 02/22/2014 1655   CALCIUM 9.1 09/16/2014 1212   CALCIUM 8.9 06/24/2014 0853   PROT 6.2* 09/16/2014 1212   PROT 6.3 06/22/2013 1618   ALBUMIN 3.5 09/16/2014 1212   ALBUMIN 4.0 06/22/2013 1618   AST 13 09/16/2014 1212   AST 13 06/22/2013 1618   ALT 23 09/16/2014 1212   ALT 19 06/22/2013 1618   ALKPHOS 63 09/16/2014 1212   ALKPHOS 58 06/22/2013 1618   BILITOT 0.42 09/16/2014 1212   BILITOT 0.5 06/22/2013 1618   GFRNONAA >90 09/01/2013 0550   GFRAA >90 09/01/2013 0550    I No results found for: SPEP  Lab Results  Component Value Date   WBC 7.1 09/16/2014   NEUTROABS 4.5 09/16/2014   HGB 13.9 09/16/2014   HCT 41.6 09/16/2014   MCV 89.5 09/16/2014   PLT 175 09/16/2014      Chemistry      Component Value Date/Time   NA 140 09/16/2014 1212   NA 139 06/24/2014 0853   K 4.3 09/16/2014 1212   K 4.1 06/24/2014 0853   CL 106 06/24/2014 0853   CO2 25 09/16/2014 1212   CO2 28 06/24/2014 0853   BUN 18.0 09/16/2014 1212   BUN 16 06/24/2014 0853   CREATININE 0.7 09/16/2014 1212   CREATININE 0.7 06/24/2014 0853   CREATININE 0.70 02/22/2014 1655      Component Value Date/Time   CALCIUM 9.1 09/16/2014 1212   CALCIUM 8.9 06/24/2014 0853   ALKPHOS 63 09/16/2014 1212   ALKPHOS 58 06/22/2013 1618   AST 13 09/16/2014 1212   AST 13 06/22/2013 1618   ALT 23 09/16/2014 1212   ALT 19 06/22/2013 1618   BILITOT 0.42 09/16/2014 1212   BILITOT 0.5 06/22/2013 1618       No results found for: LABCA2  No components found for: LABCA125  No results for input(s): INR in the last 168 hours.  Urinalysis    Component Value Date/Time   COLORURINE YELLOW 09/05/2013 [place] 09/05/2013 1525   LABSPEC 1.010 09/05/2013 1525   PHURINE 6.0 09/05/2013 1525   GLUCOSEU NEG 09/05/2013 1525   HGBUR NEG 09/05/2013 1525   BILIRUBINUR NEG 09/05/2013 1525   BILIRUBINUR n 07/04/2012 1642   KETONESUR NEG  09/05/2013 1525   PROTEINUR NEG 09/05/2013 1525   PROTEINUR n 07/04/2012 1642   UROBILINOGEN 0.2 09/05/2013 1525   UROBILINOGEN 0.2 07/04/2012 1642   NITRITE POS* 09/05/2013 1525   NITRITE n 07/04/2012 1642   LEUKOCYTESUR MOD* 09/05/2013 1525    STUDIES: No results found.   ASSESSMENT: 73 y.o. BRCA negative  woman status post right breast lower outer quadrant biopsy 08/08/2013 for a clinical T2 N0, stage IIA invasive ductal carcinoma, grade 1, estrogen receptor 90% positive, progesterone receptor 90% positive, with an MIB-1 of 12% and HER-2 negative  (1) status post bilateral mastectomies with right axillary lymph node dissection 08/31/2023 showing a right-sided pT1a pN2, stage IIIA invasive ductal carcinoma, grade 1, with negative margins  (2) given the marginal benefit in terms of mortality risk reduction from adjuvant chemotherapy, the patient decided to forego that option  (3) adjuvant radiation completed 12/14/2013  (4) tamoxifen started February 2015  (5) osteopenia with a T score of -2.2 on bone density January 2014  (6) carries a  VUS in CHEK2 [CHEK2 c.320-5T>A]  PLAN: Cherylanne is tolerating the tamoxifen moderately well. I think the longer she takes at the [place] hot flashes will be.  What is really bothering her is the "dog ears" at the lateral most aspect of her mastectomy incisions, particularly on the right. I think it may be helpful for her to see Dr. Harlow Mares locally and see if they can do something for her.  The other problem she is having is her severe left shoulder pain. I don't know if she has a tear orifice just bursitis. We'll go to start with that MRI but I would like her to see Dr. Otis Dials can proceed to either surgery or perhaps just an injection for symptom control.  Vickyis going to return to see me in 3 months. She has a good understanding of the overall plan, and agrees with it. She knows the goal of treatment in her case is cure. She will call  with any problems that may develop before her next visit here.  Chauncey Cruel, MD   09/16/2014 1:10 PM

## 2014-09-17 DIAGNOSIS — Z01419 Encounter for gynecological examination (general) (routine) without abnormal findings: Secondary | ICD-10-CM | POA: Diagnosis not present

## 2014-09-18 ENCOUNTER — Ambulatory Visit: Payer: Medicare Other | Attending: General Surgery | Admitting: Physical Therapy

## 2014-09-18 DIAGNOSIS — I972 Postmastectomy lymphedema syndrome: Secondary | ICD-10-CM | POA: Diagnosis not present

## 2014-09-18 DIAGNOSIS — Z9013 Acquired absence of bilateral breasts and nipples: Secondary | ICD-10-CM | POA: Insufficient documentation

## 2014-09-18 DIAGNOSIS — C50511 Malignant neoplasm of lower-outer quadrant of right female breast: Secondary | ICD-10-CM | POA: Insufficient documentation

## 2014-09-18 NOTE — Therapy (Signed)
Physical Therapy Treatment  Patient Details  Name: Charlize Hathaway MRN: [id number] Date of Birth: 07-13-1941  Encounter Date: 09/18/2014      PT End of Session - 09/18/14 1726    Visit Number 2   Number of Visits 8   Date for PT Re-Evaluation 10/14/14   PT Start Time 1600   PT Stop Time 1650   PT Time Calculation (min) 50 min   Activity Tolerance Patient tolerated treatment well      Past Medical History  Diagnosis Date  . Chest pain, atypical     Negative stress test 02/2009  . Other and unspecified hyperlipidemia   . Personal history of urinary calculi   . Pain in joint, shoulder region   . Irritable bowel syndrome   . Chronic insomnia   . GERD (gastroesophageal reflux disease)   . History of colonic polyps   . Fibromyalgia   . Hypothyroidism   . Other abnormal glucose   . Stress incontinence, female   . Macular degeneration   . Cluster headaches     history of migraines  . Meniscus tear     history of right knee torn meniscus  . Arthritis   . Kidney stone   . Cataract   . Depression   . Pneumonia 0932,6712  . Asthma     hx of -no inhalers, no problems  . H/O hiatal hernia   . Breast cancer 08/08/13    right LOQ  . Hx of radiation therapy 10/29/13- 12/14/13    right chest wall 5040 cGy 28 sessions, right supraclavicular/axillary region 5040 cGy 28 sessions, right chest wall boost 1000 cGy 5 sessions  . Psoriasis     mild    Past Surgical History  Procedure Laterality Date  . Appendectomy    . Cholecystectomy    . Abdominal hysterectomy    . Tonsillectomy    . Eye surgery      to repair macular hole  . Cataract extraction, bilateral    . Knee arthroscopy      > 10 years ago  . Joint replacement  03/15/11    left knee replacement  . Colonoscopy    . Ganglion cyst excision      rt foot  . Foot arthroplasty      lt   . Mass excision  11/04/2011    Procedure: EXCISION MASS;  Surgeon: Cammie Sickle., MD;  Location: [place];   Service: Orthopedics;  Laterality: Right;  excisional biopsy right ulna mass  . Hemorrhoid surgery      03/1993  . Breast cyst aspiration      9 cysts  . Toe surgery      preventative crossover toe surg/right foot  . Toe surgery      left foot/screw  in 2nd toe  . Upper gastrointestinal endoscopy    . Skin tags removed      breast, panty line, neckline  . Bilateral total mastectomy with axillary lymph node dissection  08/30/2013    Dr Barry Dienes  . Simple mastectomy with axillary sentinel node biopsy Left 08/30/2013    Procedure: Bilateral Breast Mastectomy ;  Surgeon: Stark Klein, MD;  Location: [place];  Service: General;  Laterality: Left;  Marland Kitchen Mastectomy w/ sentinel node biopsy Right 08/30/2013    Procedure: RIGHT  AXILLARY SENTINEL LYMPH NODE BIOPSY; Right Axillary Node Disection;  Surgeon: Stark Klein, MD;  Location: [place];  Service: General;  Laterality: Right;  Right side nuc  med 7:00   . Evacuation breast hematoma Left 08/31/2013    Procedure: EVACUATION HEMATOMA BREAST;  Surgeon: Stark Klein, MD;  Location: [place];  Service: General;  Laterality: Left;    There were no vitals taken for this visit.  Visit Diagnosis:  Breast cancer of lower-outer quadrant of right female breast  S/P bilateral mastectomy        [facility] PT Assessment - 09/18/14 1700    Precautions   Precaution Comments axillary node dissection right, cancer precautions     Treatment:  Manual lymph drainage in supine as follows: short neck, left axillary nodes, right inguinal nodes, superficial and deep abdominals; anterior inter-axillary anastamoses, right axillo-inguinal anastamoses. Right shoulder and dog ear at anterior axilla, patient to sidelying for work to lateral chest under right arm toward back, posterior interaxillary anastamoses and back.  Made 1/4 foam patch in Tg soft stockinette for patient to wear under her post surgery pink compression garment. Showed patient options for compression bras, but she  is not able to tolerate them because of pain from pressing in  at edematous dog ear       Plan - 09/18/14 1727    Clinical Impression Statement Edema noted in right lateral and posterior chest as well at anterior axilla   Pt will benefit from skilled therapeutic intervention in order to improve on the following deficits Increased edema;Impaired UE functional use   Rehab Potential Good   PT Frequency Min 2X/week   PT Duration 4 weeks   PT Treatment/Interventions Manual techniques   PT Plan continue with and instruct in manual lymph drainage for home.  Work toward  compression garment        Problem List Patient Active Problem List   Diagnosis Date Noted  . Atypical chest pain 07/15/2014  . Arthralgia 02/22/2014  . Psoriasis 02/22/2014  . Breast cancer of lower-outer quadrant of right female breast, s/p bilateral mastectomy, R ALND 08/2013 08/09/2013  . Neck pain 06/22/2013  . Left knee pain 11/29/2012  . Cluster headache 10/04/2012  . Gastroenteritis 04/26/2012  . Reactive depression (situational) 04/26/2012  . Right wrist pain 02/02/2012  . Right foot pain 10/04/2011  . Tinea capitis 10/04/2011  . Skin moles 06/05/2011  . Status post total knee replacement 04/06/2011  . Overactive bladder 03/14/2011  . MOLE 12/21/2010  . KNEE PAIN, LEFT 08/31/2010  . HEARING LOSS 08/17/2010  . PSORIASIS 02/26/2010  . HIRSUTISM 06/16/2009  . SHOULDER PAIN, LEFT 11/21/2008  . HELICOBACTER PYLORI INFECTION, HX OF 06/18/2008  . CYST, IDIOPATHIC 05/20/2008  . FOOT PAIN 04/22/2008  . IRRITABLE BOWEL SYNDROME 04/15/2008  . ASTHMA 03/19/2008  . GERD 03/19/2008  . COLONIC POLYPS, HX OF 03/19/2008  . NEPHROLITHIASIS, HX OF 03/19/2008  . HYPOTHYROIDISM 03/18/2008  . HYPERLIPIDEMIA 03/18/2008  . INSOMNIA, CHRONIC 03/18/2008  . FIBROMYALGIA 03/18/2008  . DIABETES MELLITUS, TYPE II, BORDERLINE 03/18/2008              Short Term Clinic Goals - 09/18/14 1733    CC Short Term Goal  #1    Title report overall pain decreased >/=25 percent to otlerate daily tasks with less pain   Time 2   Period Weeks   Status On-going   CC Short Term Goal  #2   Title reduce  edema by perceived 25% at right dog-ear and inferior to right axilla   Time 2   Period Weeks   Status On-going          Long Term  Clinic Goals - 09/18/14 1736    CC Long Term Goal  #1   Title verbalize good undestanding of the manintenance phase of treatment including manyal lymph drainage, se of compression and lymphedema risk reduction practices   Time 4   Period Weeks   Status On-going   CC Long Term Goal  #2   Title reduce edema by perceived 505 at right dog-ear and inferior to right axilla   Time 4   Period Weeks   Status On-going   CC Long Term Goal  #3   Title report overall pain decreased >/= 50 percent to tolerate daily tasks with less pain        Helene Kelp K. Owens Shark, PT  09/18/2014, 5:40 PM

## 2014-09-19 ENCOUNTER — Encounter: Payer: Self-pay | Admitting: Internal Medicine

## 2014-09-24 ENCOUNTER — Ambulatory Visit (HOSPITAL_COMMUNITY)
Admission: RE | Admit: 2014-09-24 | Discharge: 2014-09-24 | Disposition: A | Discharge status: Home / Self Care | Payer: Medicare Other | Source: Ambulatory Visit | Attending: Oncology | Admitting: Oncology

## 2014-09-24 ENCOUNTER — Ambulatory Visit: Payer: Medicare Other | Admitting: Physical Therapy

## 2014-09-24 DIAGNOSIS — M19012 Primary osteoarthritis, left shoulder: Secondary | ICD-10-CM | POA: Diagnosis not present

## 2014-09-24 DIAGNOSIS — M7552 Bursitis of left shoulder: Secondary | ICD-10-CM | POA: Insufficient documentation

## 2014-09-24 DIAGNOSIS — M25512 Pain in left shoulder: Secondary | ICD-10-CM | POA: Diagnosis present

## 2014-09-24 DIAGNOSIS — R0789 Other chest pain: Secondary | ICD-10-CM

## 2014-09-24 DIAGNOSIS — C50511 Malignant neoplasm of lower-outer quadrant of right female breast: Secondary | ICD-10-CM

## 2014-09-24 DIAGNOSIS — M659 Synovitis and tenosynovitis, unspecified: Secondary | ICD-10-CM | POA: Insufficient documentation

## 2014-09-24 DIAGNOSIS — M6289 Other specified disorders of muscle: Secondary | ICD-10-CM | POA: Diagnosis not present

## 2014-09-24 DIAGNOSIS — I890 Lymphedema, not elsewhere classified: Secondary | ICD-10-CM

## 2014-09-24 NOTE — Therapy (Signed)
Physical Therapy Treatment  Patient Details  Name: Jean Nash MRN: [id number] Date of Birth: 05-28-1941  Encounter Date: 09/24/2014      PT End of Session - 09/24/14 1522    Visit Number 3   Number of Visits 8   Date for PT Re-Evaluation 10/14/14   PT Start Time 3154   PT Stop Time 0086   PT Time Calculation (min) 54 min   Activity Tolerance Patient tolerated treatment well      Past Medical History  Diagnosis Date  . Chest pain, atypical     Negative stress test 02/2009  . Other and unspecified hyperlipidemia   . Personal history of urinary calculi   . Pain in joint, shoulder region   . Irritable bowel syndrome   . Chronic insomnia   . GERD (gastroesophageal reflux disease)   . History of colonic polyps   . Fibromyalgia   . Hypothyroidism   . Other abnormal glucose   . Stress incontinence, female   . Macular degeneration   . Cluster headaches     history of migraines  . Meniscus tear     history of right knee torn meniscus  . Arthritis   . Kidney stone   . Cataract   . Depression   . Pneumonia 7619,5093  . Asthma     hx of -no inhalers, no problems  . H/O hiatal hernia   . Breast cancer 08/08/13    right LOQ  . Hx of radiation therapy 10/29/13- 12/14/13    right chest wall 5040 cGy 28 sessions, right supraclavicular/axillary region 5040 cGy 28 sessions, right chest wall boost 1000 cGy 5 sessions  . Psoriasis     mild    Past Surgical History  Procedure Laterality Date  . Appendectomy    . Cholecystectomy    . Abdominal hysterectomy    . Tonsillectomy    . Eye surgery      to repair macular hole  . Cataract extraction, bilateral    . Knee arthroscopy      > 10 years ago  . Joint replacement  03/15/11    left knee replacement  . Colonoscopy    . Ganglion cyst excision      rt foot  . Foot arthroplasty      lt   . Mass excision  11/04/2011    Procedure: EXCISION MASS;  Surgeon: Cammie Sickle., MD;  Location: [place];   Service: Orthopedics;  Laterality: Right;  excisional biopsy right ulna mass  . Hemorrhoid surgery      03/1993  . Breast cyst aspiration      9 cysts  . Toe surgery      preventative crossover toe surg/right foot  . Toe surgery      left foot/screw  in 2nd toe  . Upper gastrointestinal endoscopy    . Skin tags removed      breast, panty line, neckline  . Bilateral total mastectomy with axillary lymph node dissection  08/30/2013    Dr Barry Dienes  . Simple mastectomy with axillary sentinel node biopsy Left 08/30/2013    Procedure: Bilateral Breast Mastectomy ;  Surgeon: Stark Klein, MD;  Location: [place];  Service: General;  Laterality: Left;  Marland Kitchen Mastectomy w/ sentinel node biopsy Right 08/30/2013    Procedure: RIGHT  AXILLARY SENTINEL LYMPH NODE BIOPSY; Right Axillary Node Disection;  Surgeon: Stark Klein, MD;  Location: [place];  Service: General;  Laterality: Right;  Right side nuc  med 7:00   . Evacuation breast hematoma Left 08/31/2013    Procedure: EVACUATION HEMATOMA BREAST;  Surgeon: Stark Klein, MD;  Location: [place];  Service: General;  Laterality: Left;    There were no vitals taken for this visit.  Visit Diagnosis:  Lymphedema  Tightness in chest      Subjective Assessment - 09/24/14 1424    Symptoms Has been wearing the pink smocked compression garment with foam pad; it's not entirely comfortable.  Seems to have helped a little bit.   Currently in Pain? Yes   Pain Location Chest   Pain Orientation Right   Pain Descriptors / Indicators Constant;Sharp   Pain Onset More than a month ago   Pain Frequency Constant            [facility] Adult PT Treatment/Exercise - 09/24/14 0001    Manual Therapy   Manual Therapy Myofascial release   Myofascial Release Crosshands technique at chest in vertical, horizontal, and diagonal directions; right UE myofascial pulling, and pulling with concurrent stretch in opposite direction at chest, all in supine.   Manual Lymphatic Drainage (MLD)  In supine, short neck, superficial and deep abdomen, left axilla and anterior interaxillary anastomosis, right groin and axillo-inguinal anastomosis, and area at right lateral chest "dog ear" and inferior to right axilla; then in left sidelying, right axillo-inguinal anastomosis and posterior interaxillary anastomosis as well as areas of swelling.     Patient's husband was present today for treatment and observed some of it, but wasn't feeling great so didn't perform manual lymph drainage.  They made a copy of the self-manual lymph drainage DVD but haven't watched it yet.  Also, suggested to patient and her husband that she try a compression T-shirt, if she could tolerate it, because it would provide compression at areas where she needs it (right lateral chest dog ear and inferior to axilla), and she could add compression to those areas then with her foam pad.  Patient reports she has a rash guard swim suit with long sleeves that fits her snugly and she has tolerated wearing it for an hour while swimming, so she agreed to try that to see if it helps.           Plan - 09/24/14 1523    Clinical Impression Statement Did well with session.  Pt. feels treatment has helped a little so far.   Pt will benefit from skilled therapeutic intervention in order to improve on the following deficits Increased edema   PT Next Visit Plan Continue myofascial release techniques for chest; continue manual lymph drainage and instructing husband.        Problem List Patient Active Problem List   Diagnosis Date Noted  . Atypical chest pain 07/15/2014  . Arthralgia 02/22/2014  . Psoriasis 02/22/2014  . Breast cancer of lower-outer quadrant of right female breast, s/p bilateral mastectomy, R ALND 08/2013 08/09/2013  . Neck pain 06/22/2013  . Left knee pain 11/29/2012  . Cluster headache 10/04/2012  . Gastroenteritis 04/26/2012  . Reactive depression (situational) 04/26/2012  . Right wrist pain 02/02/2012  .  Right foot pain 10/04/2011  . Tinea capitis 10/04/2011  . Skin moles 06/05/2011  . Status post total knee replacement 04/06/2011  . Overactive bladder 03/14/2011  . MOLE 12/21/2010  . KNEE PAIN, LEFT 08/31/2010  . HEARING LOSS 08/17/2010  . PSORIASIS 02/26/2010  . HIRSUTISM 06/16/2009  . SHOULDER PAIN, LEFT 11/21/2008  . HELICOBACTER PYLORI INFECTION, HX OF 06/18/2008  . CYST, IDIOPATHIC  05/20/2008  . FOOT PAIN 04/22/2008  . IRRITABLE BOWEL SYNDROME 04/15/2008  . ASTHMA 03/19/2008  . GERD 03/19/2008  . COLONIC POLYPS, HX OF 03/19/2008  . NEPHROLITHIASIS, HX OF 03/19/2008  . HYPOTHYROIDISM 03/18/2008  . HYPERLIPIDEMIA 03/18/2008  . INSOMNIA, CHRONIC 03/18/2008  . FIBROMYALGIA 03/18/2008  . DIABETES MELLITUS, TYPE II, BORDERLINE 03/18/2008     Carmelo Reidel 09/24/2014, 3:27 PM

## 2014-09-26 ENCOUNTER — Ambulatory Visit: Payer: Medicare Other

## 2014-09-26 DIAGNOSIS — C50511 Malignant neoplasm of lower-outer quadrant of right female breast: Secondary | ICD-10-CM

## 2014-09-26 DIAGNOSIS — I890 Lymphedema, not elsewhere classified: Secondary | ICD-10-CM

## 2014-09-26 DIAGNOSIS — Z9013 Acquired absence of bilateral breasts and nipples: Secondary | ICD-10-CM

## 2014-09-26 DIAGNOSIS — R0789 Other chest pain: Secondary | ICD-10-CM

## 2014-09-26 NOTE — Therapy (Signed)
Physical Therapy Treatment  Patient Details  Name: Jean Nash MRN: [id number] Date of Birth: 04/25/1941  Encounter Date: 09/26/2014      PT End of Session - 09/26/14 1533    Visit Number 4   Number of Visits 8   PT Start Time 1433   PT Stop Time 1522   PT Time Calculation (min) 49 min      Past Medical History  Diagnosis Date  . Chest pain, atypical     Negative stress test 02/2009  . Other and unspecified hyperlipidemia   . Personal history of urinary calculi   . Pain in joint, shoulder region   . Irritable bowel syndrome   . Chronic insomnia   . GERD (gastroesophageal reflux disease)   . History of colonic polyps   . Fibromyalgia   . Hypothyroidism   . Other abnormal glucose   . Stress incontinence, female   . Macular degeneration   . Cluster headaches     history of migraines  . Meniscus tear     history of right knee torn meniscus  . Arthritis   . Kidney stone   . Cataract   . Depression   . Pneumonia 1967,2009  . Asthma     hx of -no inhalers, no problems  . H/O hiatal hernia   . Breast cancer 08/08/13    right LOQ  . Hx of radiation therapy 10/29/13- 12/14/13    right chest wall 5040 cGy 28 sessions, right supraclavicular/axillary region 5040 cGy 28 sessions, right chest wall boost 1000 cGy 5 sessions  . Psoriasis     mild    Past Surgical History  Procedure Laterality Date  . Appendectomy    . Cholecystectomy    . Abdominal hysterectomy    . Tonsillectomy    . Eye surgery      to repair macular hole  . Cataract extraction, bilateral    . Knee arthroscopy      > 10 years ago  . Joint replacement  03/15/11    left knee replacement  . Colonoscopy    . Ganglion cyst excision      rt foot  . Foot arthroplasty      lt   . Mass excision  11/04/2011    Procedure: EXCISION MASS;  Surgeon: Robert V Sypher Jr., MD;  Location: [facility];  Service: Orthopedics;  Laterality: Right;  excisional biopsy right ulna mass  . Hemorrhoid  surgery      03/1993  . Breast cyst aspiration      9 cysts  . Toe surgery      preventative crossover toe surg/right foot  . Toe surgery      left foot/screw  in 2nd toe  . Upper gastrointestinal endoscopy    . Skin tags removed      breast, panty line, neckline  . Bilateral total mastectomy with axillary lymph node dissection  08/30/2013    Dr BYERLY  . Simple mastectomy with axillary sentinel node biopsy Left 08/30/2013    Procedure: Bilateral Breast Mastectomy ;  Surgeon: Faera Byerly, MD;  Location: [facility];  Service: General;  Laterality: Left;  . Mastectomy w/ sentinel node biopsy Right 08/30/2013    Procedure: RIGHT  AXILLARY SENTINEL LYMPH NODE BIOPSY; Right Axillary Node Disection;  Surgeon: Faera Byerly, MD;  Location: [facility];  Service: General;  Laterality: Right;  Right side nuc med 7:00   . Evacuation breast hematoma Left 08/31/2013    Procedure: EVACUATION   HEMATOMA BREAST;  Surgeon: Stark Klein, MD;  Location: [place];  Service: General;  Laterality: Left;    There were no vitals taken for this visit.  Visit Diagnosis:  Lymphedema  Tightness in chest  Breast cancer of lower-outer quadrant of right female breast  S/P bilateral mastectomy      Subjective Assessment - 09/26/14 1527    Symptoms Fixed the pink smocked compression garment and have been able to wear it all day now with the foam. Havent tried the rash guard shirt yet. My husband has been doing the massage on me some and he's doing well with the light  pressure.   Currently in Pain? No/denies            [facility] Adult PT Treatment/Exercise - 09/26/14 0001    Manual Therapy   Manual Therapy Myofascial release   Myofascial Release Supine: Crosshands technique at chest in vertical, horizontal, and diagnol directions; Rt UE myofascial pulling.   Manual Lymphatic Drainage (MLD) Supine and Lt S/L : short neck, Lt axilla and Rt inguinal nodes, anterior and posterior inter-axillary anastomosis, and upper arm;  focused on Rt lateral chest "dog ear" and inferior to Rt axilla.                Plan - 09/26/14 1534    Clinical Impression Statement Patient tolerating treatment very well and is doing well with compression garments and foam now that she has gotten them comfortable for wear.   Pt will benefit from skilled therapeutic intervention in order to improve on the following deficits Increased edema   PT Next Visit Plan Continue myofascial release techniques for chest; continue manual lymph drainage and instructing husband.   PT Plan continue with and instruct in manual lymph drainage for home.  Work toward  compression garment        Problem List Patient Active Problem List   Diagnosis Date Noted  . Atypical chest pain 07/15/2014  . Arthralgia 02/22/2014  . Psoriasis 02/22/2014  . Breast cancer of lower-outer quadrant of right female breast, s/p bilateral mastectomy, R ALND 08/2013 08/09/2013  . Neck pain 06/22/2013  . Left knee pain 11/29/2012  . Cluster headache 10/04/2012  . Gastroenteritis 04/26/2012  . Reactive depression (situational) 04/26/2012  . Right wrist pain 02/02/2012  . Right foot pain 10/04/2011  . Tinea capitis 10/04/2011  . Skin moles 06/05/2011  . Status post total knee replacement 04/06/2011  . Overactive bladder 03/14/2011  . MOLE 12/21/2010  . KNEE PAIN, LEFT 08/31/2010  . HEARING LOSS 08/17/2010  . PSORIASIS 02/26/2010  . HIRSUTISM 06/16/2009  . SHOULDER PAIN, LEFT 11/21/2008  . HELICOBACTER PYLORI INFECTION, HX OF 06/18/2008  . CYST, IDIOPATHIC 05/20/2008  . FOOT PAIN 04/22/2008  . IRRITABLE BOWEL SYNDROME 04/15/2008  . ASTHMA 03/19/2008  . GERD 03/19/2008  . COLONIC POLYPS, HX OF 03/19/2008  . NEPHROLITHIASIS, HX OF 03/19/2008  . HYPOTHYROIDISM 03/18/2008  . HYPERLIPIDEMIA 03/18/2008  . INSOMNIA, CHRONIC 03/18/2008  . FIBROMYALGIA 03/18/2008  . DIABETES MELLITUS, TYPE II, BORDERLINE 03/18/2008                                             Long Term Clinic Goals - 09/26/14 1536    [facility] Long Term Goal  #1   Title verbalize good undestanding of the manintenance phase of treatment including manyal lymph drainage, se of compression and  lymphedema risk reduction practices   Time 4   Period Weeks   Status Partially Met   CC Long Term Goal  #2   Title reduce edema by perceived 50% at right dog-ear and inferior to right axilla   Time 4   Period Weeks   Status On-going   CC Long Term Goal  #3   Title report overall pain decreased >/= 50 percent to tolerate daily tasks with less pain   Time 4   Period Weeks   Status On-going          Otelia Limes, PTA 09/26/2014, 3:40 PM

## 2014-09-30 ENCOUNTER — Other Ambulatory Visit: Payer: Self-pay | Admitting: Oncology

## 2014-09-30 ENCOUNTER — Ambulatory Visit: Payer: Medicare Other | Admitting: Physical Therapy

## 2014-10-02 ENCOUNTER — Ambulatory Visit: Payer: Medicare Other | Admitting: Physical Therapy

## 2014-10-02 DIAGNOSIS — C50511 Malignant neoplasm of lower-outer quadrant of right female breast: Secondary | ICD-10-CM | POA: Diagnosis not present

## 2014-10-02 DIAGNOSIS — R0789 Other chest pain: Secondary | ICD-10-CM

## 2014-10-02 DIAGNOSIS — Z9013 Acquired absence of bilateral breasts and nipples: Secondary | ICD-10-CM

## 2014-10-02 DIAGNOSIS — I890 Lymphedema, not elsewhere classified: Secondary | ICD-10-CM

## 2014-10-02 NOTE — Therapy (Signed)
Physical Therapy Treatment  Patient Details  Name: Jean Nash MRN: [id number] Date of Birth: 06-07-1941  Encounter Date: 10/02/2014      PT End of Session - 10/02/14 1717    Visit Number 5   Number of Visits 8   Date for PT Re-Evaluation 10/14/14   PT Start Time 4818   PT Stop Time 1655   PT Time Calculation (min) 45 min   Activity Tolerance Patient tolerated treatment well      Past Medical History  Diagnosis Date  . Chest pain, atypical     Negative stress test 02/2009  . Other and unspecified hyperlipidemia   . Personal history of urinary calculi   . Pain in joint, shoulder region   . Irritable bowel syndrome   . Chronic insomnia   . GERD (gastroesophageal reflux disease)   . History of colonic polyps   . Fibromyalgia   . Hypothyroidism   . Other abnormal glucose   . Stress incontinence, female   . Macular degeneration   . Cluster headaches     history of migraines  . Meniscus tear     history of right knee torn meniscus  . Arthritis   . Kidney stone   . Cataract   . Depression   . Pneumonia 5631,4970  . Asthma     hx of -no inhalers, no problems  . H/O hiatal hernia   . Breast cancer 08/08/13    right LOQ  . Hx of radiation therapy 10/29/13- 12/14/13    right chest wall 5040 cGy 28 sessions, right supraclavicular/axillary region 5040 cGy 28 sessions, right chest wall boost 1000 cGy 5 sessions  . Psoriasis     mild    Past Surgical History  Procedure Laterality Date  . Appendectomy    . Cholecystectomy    . Abdominal hysterectomy    . Tonsillectomy    . Eye surgery      to repair macular hole  . Cataract extraction, bilateral    . Knee arthroscopy      > 10 years ago  . Joint replacement  03/15/11    left knee replacement  . Colonoscopy    . Ganglion cyst excision      rt foot  . Foot arthroplasty      lt   . Mass excision  11/04/2011    Procedure: EXCISION MASS;  Surgeon: Cammie Sickle., MD;  Location: [place];   Service: Orthopedics;  Laterality: Right;  excisional biopsy right ulna mass  . Hemorrhoid surgery      03/1993  . Breast cyst aspiration      9 cysts  . Toe surgery      preventative crossover toe surg/right foot  . Toe surgery      left foot/screw  in 2nd toe  . Upper gastrointestinal endoscopy    . Skin tags removed      breast, panty line, neckline  . Bilateral total mastectomy with axillary lymph node dissection  08/30/2013    Dr Barry Dienes  . Simple mastectomy with axillary sentinel node biopsy Left 08/30/2013    Procedure: Bilateral Breast Mastectomy ;  Surgeon: Stark Klein, MD;  Location: [place];  Service: General;  Laterality: Left;  Marland Kitchen Mastectomy w/ sentinel node biopsy Right 08/30/2013    Procedure: RIGHT  AXILLARY SENTINEL LYMPH NODE BIOPSY; Right Axillary Node Disection;  Surgeon: Stark Klein, MD;  Location: [place];  Service: General;  Laterality: Right;  Right side nuc  med 7:00   . Evacuation breast hematoma Left 08/31/2013    Procedure: EVACUATION HEMATOMA BREAST;  Surgeon: Stark Klein, MD;  Location: [place];  Service: General;  Laterality: Left;    There were no vitals taken for this visit.  Visit Diagnosis:  Lymphedema  Tightness in chest  Breast cancer of lower-outer quadrant of right female breast  S/P bilateral mastectomy      Subjective Assessment - 10/02/14 1704    Symptoms just had an mri on the left shoulder, she has severe arthritis but no cancer   Patient Stated Goals to get rid of the edema pocket at anterior axilla   Currently in Pain? No/denies            [facility] Adult PT Treatment/Exercise - 10/02/14 1712    Neck Exercises: Supine   Neck Retraction --  3   Neck Retraction Limitations 3 reps   Other Supine Exercise scapular retreaction x 5 reps   Lumbar Exercises: Supine   Ab Set 5 reps   AB Set Limitations also did posterior pelvic tilt   Other Supine Lumbar Exercises full leg extesion "telesope" for side stretch    Manual Therapy   Manual  Therapy Myofascial release   Myofascial Release cross hands vertically and diagonally at incision   Manual Lymphatic Drainage (MLD) short neck, superficial and deep abdominals, right groin,right chest anterior and lateral, with extra time on dogear at anterior axilla          Plan - 10/02/14 1718    Clinical Impression Statement  pt has been wearing pink compression garment around chest and edema at lateral chest is much softer. Discussed wear-ease compression bra as possible long term alternative.  For now, issued two small foam patches to try under tight fitting rash guard shirt she wears for swimming to see if that would offer some compression to problem dog ear areas   PT Next Visit Plan Continue myofascial release techniques for chest; continue manual lymph drainage and instructing husband.          Problem List Patient Active Problem List   Diagnosis Date Noted  . Atypical chest pain 07/15/2014  . Arthralgia 02/22/2014  . Psoriasis 02/22/2014  . Breast cancer of lower-outer quadrant of right female breast, s/p bilateral mastectomy, R ALND 08/2013 08/09/2013  . Neck pain 06/22/2013  . Left knee pain 11/29/2012  . Cluster headache 10/04/2012  . Gastroenteritis 04/26/2012  . Reactive depression (situational) 04/26/2012  . Right wrist pain 02/02/2012  . Right foot pain 10/04/2011  . Tinea capitis 10/04/2011  . Skin moles 06/05/2011  . Status post total knee replacement 04/06/2011  . Overactive bladder 03/14/2011  . MOLE 12/21/2010  . KNEE PAIN, LEFT 08/31/2010  . HEARING LOSS 08/17/2010  . PSORIASIS 02/26/2010  . HIRSUTISM 06/16/2009  . SHOULDER PAIN, LEFT 11/21/2008  . HELICOBACTER PYLORI INFECTION, HX OF 06/18/2008  . CYST, IDIOPATHIC 05/20/2008  . FOOT PAIN 04/22/2008  . IRRITABLE BOWEL SYNDROME 04/15/2008  . ASTHMA 03/19/2008  . GERD 03/19/2008  . COLONIC POLYPS, HX OF 03/19/2008  . NEPHROLITHIASIS, HX OF 03/19/2008  . HYPOTHYROIDISM 03/18/2008  . HYPERLIPIDEMIA  03/18/2008  . INSOMNIA, CHRONIC 03/18/2008  . FIBROMYALGIA 03/18/2008  . DIABETES MELLITUS, TYPE II, BORDERLINE 03/18/2008           Short Term Clinic Goals - 10/02/14 1620    CC Short Term Goal  #1   Title report overall pain decreased >/=25 percent to otlerate daily tasks with less pain  Time 2   Period Days   Status Achieved   CC Short Term Goal  #2   Title reduce  edema by perceived 25% at right dog-ear and inferior to right axilla   Time 2   Period Weeks   Status On-going          Long Term Clinic Goals - 10/02/14 1721    CC Long Term Goal  #1   Title verbalize good undestanding of the manintenance phase of treatment including manyal lymph drainage, se of compression and lymphedema risk reduction practices   Time 4   Period Weeks   Status Partially Met   CC Long Term Goal  #2   Title reduce edema by perceived 50% at right dog-ear and inferior to right axilla   Time 4   Period Weeks   Status On-going   CC Long Term Goal  #3   Title report overall pain decreased >/= 50 percent to tolerate daily tasks with less pain   Time 4   Period Weeks   Status Achieved       Norwood Levo 10/02/2014, 5:23 PM

## 2014-10-07 ENCOUNTER — Ambulatory Visit: Payer: Medicare Other

## 2014-10-07 DIAGNOSIS — Z9013 Acquired absence of bilateral breasts and nipples: Secondary | ICD-10-CM

## 2014-10-07 DIAGNOSIS — C50511 Malignant neoplasm of lower-outer quadrant of right female breast: Secondary | ICD-10-CM

## 2014-10-07 DIAGNOSIS — R0789 Other chest pain: Secondary | ICD-10-CM

## 2014-10-07 DIAGNOSIS — I890 Lymphedema, not elsewhere classified: Secondary | ICD-10-CM

## 2014-10-07 NOTE — Therapy (Signed)
Physical Therapy Treatment  Patient Details  Name: Jean Nash MRN: [id number] Date of Birth: 09/28/1941  Encounter Date: 10/07/2014      PT End of Session - 10/07/14 1111    Visit Number 6   Number of Visits 8   PT Start Time 1017   PT Stop Time 1105   PT Time Calculation (min) 48 min      Past Medical History  Diagnosis Date  . Chest pain, atypical     Negative stress test 02/2009  . Other and unspecified hyperlipidemia   . Personal history of urinary calculi   . Pain in joint, shoulder region   . Irritable bowel syndrome   . Chronic insomnia   . GERD (gastroesophageal reflux disease)   . History of colonic polyps   . Fibromyalgia   . Hypothyroidism   . Other abnormal glucose   . Stress incontinence, female   . Macular degeneration   . Cluster headaches     history of migraines  . Meniscus tear     history of right knee torn meniscus  . Arthritis   . Kidney stone   . Cataract   . Depression   . Pneumonia 6073,7106  . Asthma     hx of -no inhalers, no problems  . H/O hiatal hernia   . Breast cancer 08/08/13    right LOQ  . Hx of radiation therapy 10/29/13- 12/14/13    right chest wall 5040 cGy 28 sessions, right supraclavicular/axillary region 5040 cGy 28 sessions, right chest wall boost 1000 cGy 5 sessions  . Psoriasis     mild    Past Surgical History  Procedure Laterality Date  . Appendectomy    . Cholecystectomy    . Abdominal hysterectomy    . Tonsillectomy    . Eye surgery      to repair macular hole  . Cataract extraction, bilateral    . Knee arthroscopy      > 10 years ago  . Joint replacement  03/15/11    left knee replacement  . Colonoscopy    . Ganglion cyst excision      rt foot  . Foot arthroplasty      lt   . Mass excision  11/04/2011    Procedure: EXCISION MASS;  Surgeon: Cammie Sickle., MD;  Location: [place];  Service: Orthopedics;  Laterality: Right;  excisional biopsy right ulna mass  . Hemorrhoid  surgery      03/1993  . Breast cyst aspiration      9 cysts  . Toe surgery      preventative crossover toe surg/right foot  . Toe surgery      left foot/screw  in 2nd toe  . Upper gastrointestinal endoscopy    . Skin tags removed      breast, panty line, neckline  . Bilateral total mastectomy with axillary lymph node dissection  08/30/2013    Dr Barry Dienes  . Simple mastectomy with axillary sentinel node biopsy Left 08/30/2013    Procedure: Bilateral Breast Mastectomy ;  Surgeon: Stark Klein, MD;  Location: [place];  Service: General;  Laterality: Left;  Marland Kitchen Mastectomy w/ sentinel node biopsy Right 08/30/2013    Procedure: RIGHT  AXILLARY SENTINEL LYMPH NODE BIOPSY; Right Axillary Node Disection;  Surgeon: Stark Klein, MD;  Location: [place];  Service: General;  Laterality: Right;  Right side nuc med 7:00   . Evacuation breast hematoma Left 08/31/2013    Procedure: EVACUATION  HEMATOMA BREAST;  Surgeon: Stark Klein, MD;  Location: [place];  Service: General;  Laterality: Left;    There were no vitals taken for this visit.  Visit Diagnosis:  Lymphedema  Tightness in chest  Breast cancer of lower-outer quadrant of right female breast  S/P bilateral mastectomy      Subjective Assessment - 10/07/14 1016    Symptoms Got a compression shirt and have been wearing it since Thursday and I can already tell a difference! Am almost at the end of my 4 weeks, would like to keep coming because this is really helping.     Supine: Manual Therapy: cross hands technique horizontally at bil chest walls, then vertically at Rt chest wall to patients tolerance. Manual lymph drainage: Supine: short neck, superficial and deep abdominals, Rt inguinal nodes and Rt axillo-inguinal nodes focusing on Rt dog ear area.               Plan - 10/07/14 1112    Clinical Impression Statement Patients new compression shirt has really made a difference with decreasing the fluid around dog ears.   Pt will benefit  from skilled therapeutic intervention in order to improve on the following deficits Increased edema   PT Next Visit Plan Continue myofascial release techniques for chest; continue manual lymph drainage and instructing husband.  Renew patient as she would like to continue coming.        Problem List Patient Active Problem List   Diagnosis Date Noted  . Atypical chest pain 07/15/2014  . Arthralgia 02/22/2014  . Psoriasis 02/22/2014  . Breast cancer of lower-outer quadrant of right female breast, s/p bilateral mastectomy, R ALND 08/2013 08/09/2013  . Neck pain 06/22/2013  . Left knee pain 11/29/2012  . Cluster headache 10/04/2012  . Gastroenteritis 04/26/2012  . Reactive depression (situational) 04/26/2012  . Right wrist pain 02/02/2012  . Right foot pain 10/04/2011  . Tinea capitis 10/04/2011  . Skin moles 06/05/2011  . Status post total knee replacement 04/06/2011  . Overactive bladder 03/14/2011  . MOLE 12/21/2010  . KNEE PAIN, LEFT 08/31/2010  . HEARING LOSS 08/17/2010  . PSORIASIS 02/26/2010  . HIRSUTISM 06/16/2009  . SHOULDER PAIN, LEFT 11/21/2008  . HELICOBACTER PYLORI INFECTION, HX OF 06/18/2008  . CYST, IDIOPATHIC 05/20/2008  . FOOT PAIN 04/22/2008  . IRRITABLE BOWEL SYNDROME 04/15/2008  . ASTHMA 03/19/2008  . GERD 03/19/2008  . COLONIC POLYPS, HX OF 03/19/2008  . NEPHROLITHIASIS, HX OF 03/19/2008  . HYPOTHYROIDISM 03/18/2008  . HYPERLIPIDEMIA 03/18/2008  . INSOMNIA, CHRONIC 03/18/2008  . FIBROMYALGIA 03/18/2008  . DIABETES MELLITUS, TYPE II, BORDERLINE 03/18/2008                                              Otelia Limes, PTA 10/07/2014, 11:14 AM

## 2014-10-09 ENCOUNTER — Other Ambulatory Visit (INDEPENDENT_AMBULATORY_CARE_PROVIDER_SITE_OTHER): Payer: Medicare Other

## 2014-10-09 ENCOUNTER — Ambulatory Visit: Payer: Medicare Other | Admitting: Physical Therapy

## 2014-10-09 DIAGNOSIS — C50511 Malignant neoplasm of lower-outer quadrant of right female breast: Secondary | ICD-10-CM | POA: Diagnosis not present

## 2014-10-09 DIAGNOSIS — E785 Hyperlipidemia, unspecified: Secondary | ICD-10-CM | POA: Diagnosis not present

## 2014-10-09 DIAGNOSIS — R7309 Other abnormal glucose: Secondary | ICD-10-CM | POA: Diagnosis not present

## 2014-10-09 DIAGNOSIS — R0789 Other chest pain: Secondary | ICD-10-CM

## 2014-10-09 DIAGNOSIS — E039 Hypothyroidism, unspecified: Secondary | ICD-10-CM

## 2014-10-09 DIAGNOSIS — I890 Lymphedema, not elsewhere classified: Secondary | ICD-10-CM

## 2014-10-09 LAB — LIPID PANEL
Cholesterol: 205 mg/dL — ABNORMAL HIGH (ref 0–200)
HDL: 38.6 mg/dL — AB (ref >39.00)
NonHDL: 166.4
Total CHOL/HDL Ratio: 5
Triglycerides: 217 mg/dL — ABNORMAL HIGH (ref 0.0–149.0)
VLDL: 43.4 mg/dL — ABNORMAL HIGH (ref 0.0–40.0)

## 2014-10-09 LAB — BASIC METABOLIC PANEL
BUN: 14 mg/dL (ref 6–23)
CO2: 28 mEq/L (ref 19–32)
Calcium: 9 mg/dL (ref 8.4–10.5)
Chloride: 103 mEq/L (ref 96–112)
Creatinine, Ser: 0.7 mg/dL (ref 0.4–1.2)
GFR: 82.11 mL/min (ref >60.00)
Glucose, Bld: 102 mg/dL — ABNORMAL HIGH (ref 70–99)
POTASSIUM: 4.4 meq/L (ref 3.5–5.1)
SODIUM: 140 meq/L (ref 135–145)

## 2014-10-09 LAB — HEPATIC FUNCTION PANEL
ALT: 21 U/L (ref 0–35)
AST: 18 U/L (ref 0–37)
Albumin: 4 g/dL (ref 3.5–5.2)
Alkaline Phosphatase: 67 U/L (ref 39–117)
Bilirubin, Direct: 0 mg/dL (ref 0.0–0.3)
Total Bilirubin: 0.5 mg/dL (ref 0.2–1.2)
Total Protein: 6.5 g/dL (ref 6.0–8.3)

## 2014-10-09 LAB — HEMOGLOBIN A1C: HEMOGLOBIN A1C: 6.3 % (ref 4.6–6.5)

## 2014-10-09 LAB — LDL CHOLESTEROL, DIRECT: Direct LDL: 119.1 mg/dL

## 2014-10-09 LAB — TSH: TSH: 3.44 u[IU]/mL (ref 0.35–4.50)

## 2014-10-09 NOTE — Therapy (Signed)
Physical Therapy Treatment  Patient Details  Name: Jean Nash MRN: [id number] Date of Birth: 19-Oct-1941  Encounter Date: 10/09/2014      PT End of Session - 10/09/14 1258    Visit Number 7   Number of Visits 8   Date for PT Re-Evaluation 10/14/14   PT Start Time 1025   PT Stop Time 1108   PT Time Calculation (min) 43 min      Past Medical History  Diagnosis Date  . Chest pain, atypical     Negative stress test 02/2009  . Other and unspecified hyperlipidemia   . Personal history of urinary calculi   . Pain in joint, shoulder region   . Irritable bowel syndrome   . Chronic insomnia   . GERD (gastroesophageal reflux disease)   . History of colonic polyps   . Fibromyalgia   . Hypothyroidism   . Other abnormal glucose   . Stress incontinence, female   . Macular degeneration   . Cluster headaches     history of migraines  . Meniscus tear     history of right knee torn meniscus  . Arthritis   . Kidney stone   . Cataract   . Depression   . Pneumonia 3212,2482  . Asthma     hx of -no inhalers, no problems  . H/O hiatal hernia   . Breast cancer 08/08/13    right LOQ  . Hx of radiation therapy 10/29/13- 12/14/13    right chest wall 5040 cGy 28 sessions, right supraclavicular/axillary region 5040 cGy 28 sessions, right chest wall boost 1000 cGy 5 sessions  . Psoriasis     mild    Past Surgical History  Procedure Laterality Date  . Appendectomy    . Cholecystectomy    . Abdominal hysterectomy    . Tonsillectomy    . Eye surgery      to repair macular hole  . Cataract extraction, bilateral    . Knee arthroscopy      > 10 years ago  . Joint replacement  03/15/11    left knee replacement  . Colonoscopy    . Ganglion cyst excision      rt foot  . Foot arthroplasty      lt   . Mass excision  11/04/2011    Procedure: EXCISION MASS;  Surgeon: Cammie Sickle., MD;  Location: [place];  Service: Orthopedics;  Laterality: Right;  excisional  biopsy right ulna mass  . Hemorrhoid surgery      03/1993  . Breast cyst aspiration      9 cysts  . Toe surgery      preventative crossover toe surg/right foot  . Toe surgery      left foot/screw  in 2nd toe  . Upper gastrointestinal endoscopy    . Skin tags removed      breast, panty line, neckline  . Bilateral total mastectomy with axillary lymph node dissection  08/30/2013    Dr Barry Dienes  . Simple mastectomy with axillary sentinel node biopsy Left 08/30/2013    Procedure: Bilateral Breast Mastectomy ;  Surgeon: Stark Klein, MD;  Location: [place];  Service: General;  Laterality: Left;  Marland Kitchen Mastectomy w/ sentinel node biopsy Right 08/30/2013    Procedure: RIGHT  AXILLARY SENTINEL LYMPH NODE BIOPSY; Right Axillary Node Disection;  Surgeon: Stark Klein, MD;  Location: [place];  Service: General;  Laterality: Right;  Right side nuc med 7:00   . Evacuation breast hematoma  Left 08/31/2013    Procedure: EVACUATION HEMATOMA BREAST;  Surgeon: Stark Klein, MD;  Location: [place];  Service: General;  Laterality: Left;    There were no vitals taken for this visit.  Visit Diagnosis:  Tightness in chest  Lymphedema      Subjective Assessment - 10/09/14 1255    Symptoms Again, reports that therapy is really helping.            [place] Adult PT Treatment/Exercise - 10/09/14 0001    Manual Therapy   Manual Therapy --  adjusted foam pad for right axilla with new cuts   Myofascial Release Crosshands technique vertical, horizontal, and diagonal at right chest; also right UE pulling and soft tissue mobilization at incision and pect areas.   Manual Lymphatic Drainage (MLD) short neck, superficial and deep abdomen, right groin and axillo-inguinal anastomosis, left axilla and anterior interaxillary anastomosis, and extra time spent at right dog-ear, directing toward pathways.                Plan - 10/09/14 1259    Clinical Impression Statement Pt. continues to note benefit from therapy.   Still with pain superior to right chest incision, as well as palpable tissue tightness.   PT Next Visit Plan Renew next. Continue myofascial release and manual lymph drainage.        Problem List Patient Active Problem List   Diagnosis Date Noted  . Atypical chest pain 07/15/2014  . Arthralgia 02/22/2014  . Psoriasis 02/22/2014  . Breast cancer of lower-outer quadrant of right female breast, s/p bilateral mastectomy, R ALND 08/2013 08/09/2013  . Neck pain 06/22/2013  . Left knee pain 11/29/2012  . Cluster headache 10/04/2012  . Gastroenteritis 04/26/2012  . Reactive depression (situational) 04/26/2012  . Right wrist pain 02/02/2012  . Right foot pain 10/04/2011  . Tinea capitis 10/04/2011  . Skin moles 06/05/2011  . Status post total knee replacement 04/06/2011  . Overactive bladder 03/14/2011  . MOLE 12/21/2010  . KNEE PAIN, LEFT 08/31/2010  . HEARING LOSS 08/17/2010  . PSORIASIS 02/26/2010  . HIRSUTISM 06/16/2009  . SHOULDER PAIN, LEFT 11/21/2008  . HELICOBACTER PYLORI INFECTION, HX OF 06/18/2008  . CYST, IDIOPATHIC 05/20/2008  . FOOT PAIN 04/22/2008  . IRRITABLE BOWEL SYNDROME 04/15/2008  . ASTHMA 03/19/2008  . GERD 03/19/2008  . COLONIC POLYPS, HX OF 03/19/2008  . NEPHROLITHIASIS, HX OF 03/19/2008  . HYPOTHYROIDISM 03/18/2008  . HYPERLIPIDEMIA 03/18/2008  . INSOMNIA, CHRONIC 03/18/2008  . FIBROMYALGIA 03/18/2008  . DIABETES MELLITUS, TYPE II, BORDERLINE 03/18/2008                                              SALISBURY,DONNA 10/09/2014, 1:01 PM  SALISBURY,DONNA, PT

## 2014-10-14 ENCOUNTER — Encounter: Payer: Self-pay | Admitting: Internal Medicine

## 2014-10-14 ENCOUNTER — Ambulatory Visit (INDEPENDENT_AMBULATORY_CARE_PROVIDER_SITE_OTHER): Payer: Medicare Other | Admitting: Internal Medicine

## 2014-10-14 ENCOUNTER — Encounter: Payer: BLUE CROSS/BLUE SHIELD | Admitting: Physical Therapy

## 2014-10-14 VITALS — BP 120/80 | HR 78 | Temp 98.7°F | Ht 59.0 in | Wt 185.0 lb

## 2014-10-14 DIAGNOSIS — R7303 Prediabetes: Secondary | ICD-10-CM

## 2014-10-14 DIAGNOSIS — C50511 Malignant neoplasm of lower-outer quadrant of right female breast: Secondary | ICD-10-CM | POA: Diagnosis not present

## 2014-10-14 DIAGNOSIS — R7309 Other abnormal glucose: Secondary | ICD-10-CM

## 2014-10-14 DIAGNOSIS — E039 Hypothyroidism, unspecified: Secondary | ICD-10-CM

## 2014-10-14 MED ORDER — LEVOTHYROXINE SODIUM 100 MCG PO TABS: 100.0000 ug | ORAL_TABLET | Freq: Every day | ORAL | Status: DC

## 2014-10-14 MED ORDER — ESOMEPRAZOLE MAGNESIUM 40 MG PO CPDR: 40.0000 mg | DELAYED_RELEASE_CAPSULE | Freq: Every day | ORAL | Status: DC

## 2014-10-14 MED ORDER — SIMVASTATIN 10 MG PO TABS: 10.0000 mg | ORAL_TABLET | Freq: Every day | ORAL | Status: DC

## 2014-10-14 MED ORDER — CITALOPRAM HYDROBROMIDE 20 MG PO TABS: 20.0000 mg | ORAL_TABLET | Freq: Every day | ORAL | Status: DC

## 2014-10-14 MED ORDER — PREGABALIN 75 MG PO CAPS: 75.0000 mg | ORAL_CAPSULE | Freq: Two times a day (BID) | ORAL | Status: DC

## 2014-10-14 MED ORDER — DIAZEPAM 5 MG PO TABS: ORAL_TABLET | ORAL | Status: DC

## 2014-10-14 NOTE — Assessment & Plan Note (Signed)
Increase levothyroxine to 100 g. Monitor TSH.

## 2014-10-14 NOTE — Patient Instructions (Signed)
Please complete the following lab tests before your next follow up appointment: BMET, A1c - 790.29 TSH - 244.9

## 2014-10-14 NOTE — Progress Notes (Signed)
Pre visit review using our clinic review tool, if applicable. No additional management support is needed unless otherwise documented below in the visit note. 

## 2014-10-14 NOTE — Progress Notes (Signed)
Subjective:    [name], female    DOB: 08/03/1941, 73 y.o.   MRN: [id number]  HPI  73 year old white female with history of hypothyroidism, chronic GERD and Breast cancer for routine follow-up. Interval medical history-patient followed by her oncologist. Patient tolerating tamoxifen well.  Patient referred to orthopedic specialist for severe left shoulder pain. Patient currently using combination of meloxicam and tramadol. Her pain is well-controlled.  Abnormal glucose - patient is following low-carb diet and exercising on a regular basis. She has history of PCOS but metformin was discontinued. There's been slight increase in her A1c.  Hypothyroidism-weight stable.  Mild fatigue  She is planning vacation in February of 2016 - greater than 10 hr flight. Lab Results  Component Value Date   TSH 3.44 10/09/2014   Chronic GERD - Patient has history of large hiatal hernia. She has tried and failed tapering off proton pump inhibitors in the past x 2.  Review of Systems Negative for chest pain, mild fatigue    Past Medical History  Diagnosis Date  . Chest pain, atypical     Negative stress test 02/2009  . Other and unspecified hyperlipidemia   . Personal history of urinary calculi   . Pain in joint, shoulder region   . Irritable bowel syndrome   . Chronic insomnia   . GERD (gastroesophageal reflux disease)   . History of colonic polyps   . Fibromyalgia   . Hypothyroidism   . Other abnormal glucose   . Stress incontinence, female   . Macular degeneration   . Cluster headaches     history of migraines  . Meniscus tear     history of right knee torn meniscus  . Arthritis   . Kidney stone   . Cataract   . Depression   . Pneumonia 0093,8182  . Asthma     hx of -no inhalers, no problems  . H/O hiatal hernia   . Breast cancer 08/08/13    right LOQ  . Hx of radiation therapy 10/29/13- 12/14/13    right chest wall 5040 cGy 28 sessions, right  supraclavicular/axillary region 5040 cGy 28 sessions, right chest wall boost 1000 cGy 5 sessions  . Psoriasis     mild    History   Social History  . Marital Status: Married    Spouse Name: N/A    Number of Children: 3  . Years of Education: N/A   Occupational History  . Not on file.   Social History Main Topics  . Smoking status: Former Research scientist (life sciences)  . Smokeless tobacco: Never Used  . Alcohol Use: No  . Drug Use: No  . Sexual Activity: Not on file     Comment: menarche age 49, fist live birth 18, P 3, hysterectomy age 65, no HRT, BCP 2 yrs   Other Topics Concern  . 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 Appendectomy    . Cholecystectomy    . Abdominal hysterectomy    . Tonsillectomy    . Eye surgery      to repair macular hole  . Cataract extraction, bilateral    . Knee arthroscopy      > 10 years ago  . Joint replacement  03/15/11  left knee replacement  . Colonoscopy    . Ganglion cyst excision      rt foot  . Foot arthroplasty      lt   . Mass excision  11/04/2011    Procedure: EXCISION MASS;  Surgeon: Cammie Sickle., MD;  Location: [place];  Service: Orthopedics;  Laterality: Right;  excisional biopsy right ulna mass  . Hemorrhoid surgery      03/1993  . Breast cyst aspiration      9 cysts  . Toe surgery      preventative crossover toe surg/right foot  . Toe surgery      left foot/screw  in 2nd toe  . Upper gastrointestinal endoscopy    . Skin tags removed      breast, panty line, neckline  . Bilateral total mastectomy with axillary lymph node dissection  08/30/2013    Dr Barry Dienes  . Simple mastectomy with axillary sentinel node biopsy Left 08/30/2013    Procedure: Bilateral Breast Mastectomy ;  Surgeon: Stark Klein, MD;  Location: [place];  Service: General;   Laterality: Left;  Marland Kitchen Mastectomy w/ sentinel node biopsy Right 08/30/2013    Procedure: RIGHT  AXILLARY SENTINEL LYMPH NODE BIOPSY; Right Axillary Node Disection;  Surgeon: Stark Klein, MD;  Location: [place];  Service: General;  Laterality: Right;  Right side nuc med 7:00   . Evacuation breast hematoma Left 08/31/2013    Procedure: EVACUATION HEMATOMA BREAST;  Surgeon: Stark Klein, MD;  [facility];  Service: General;  Laterality: Left;    Family History  Problem Relation Age of Onset  . Stroke Mother     died age 58  . Diabetes Mother   . Breast cancer Mother 62  . Breast cancer Sister 36  . Breast cancer Paternal Aunt 110  . Diabetes Maternal Grandfather   . Breast cancer Paternal Grandmother 56  . Breast cancer Paternal Aunt     dx in her 45s  . Cancer Maternal Grandmother     intra-abdominal cancer  . Brain cancer Maternal Uncle 8  . Brain cancer Cousin 20    maternal cousin  . Brain cancer Cousin 20    paternal cousin    No Known Allergies  Current Outpatient Prescriptions on File Prior to Visit  Medication Sig Dispense Refill  . Calcium-Vitamin D-Vitamin K [phone] MG-UNT-MCG CHEW Chew by mouth.    . Cholecalciferol (VITAMIN D) 2000 UNITS CAPS Take 1 capsule by mouth 2 (two) times daily.    Marland Kitchen docusate sodium (STOOL SOFTENER) 100 MG capsule Take 100 mg by mouth 3 (three) times daily as needed.     . Lutein-Zeaxanthin 25-5 MG CAPS Take by mouth.    . meloxicam (MOBIC) 7.5 MG tablet Take 1 tablet by mouth 2  times daily. 180 tablet 1  . tamoxifen (NOLVADEX) 20 MG tablet Take 1 tablet (20 mg total) by mouth daily. 90 tablet 4  . traMADol (ULTRAM) 50 MG tablet Take 1 tablet (50 mg total) by mouth every 6 (six) hours as needed for moderate pain. 360 tablet 4  . UNABLE TO FIND Rx: L8000- Post Surgical Bras (Quantity: 6) L2440- Silicone Breast prosthesis (Quantity: 2) Dx: 174.9; Bilateral mastectomies 1 each 0  . UNABLE TO FIND Rx: N0272 silicone breast prosthesis (qty 2)  Athletic Form DX: 174.9 Bilateral Masty 1 each 0  . Wheat Dextrin (BENEFIBER PO) Take by mouth.     No current facility-administered medications on file prior to visit.  BP 120/80 mmHg  Pulse 78  Temp(Src) 98.7 F (37.1 C) (Oral)  Ht 4\' 11"  (1.499 m)  Wt 185 lb (83.915 kg)  BMI 37.35 kg/m2    Objective:   Physical Exam  Constitutional: She is oriented to person, place, and time. She appears well-developed and well-nourished.  HENT:  Head: Normocephalic and atraumatic.  Neck: Neck supple.  No carotid bruit  Cardiovascular: Normal rate, regular rhythm and normal heart sounds.   Pulmonary/Chest: Effort normal and breath sounds normal.  Musculoskeletal:  Minimal edema right arm  Neurological: She is alert and oriented to person, place, and time.  Skin: Skin is warm and dry.  Psychiatric: She has a normal mood and affect. Her behavior is normal.          Assessment & Plan:

## 2014-10-14 NOTE — Assessment & Plan Note (Signed)
Metformin discontinued.  She would like to try controlling with diet and exercise. Monitor A1c. Lab Results  Component Value Date   HGBA1C 6.3 10/09/2014

## 2014-10-14 NOTE — Assessment & Plan Note (Signed)
CA 125 monitored by oncology.  Patient is planning vacation in February 2016 which involves traveling on an airplane for greater than 10 hours. Patient to discuss DVT prophylaxis for extended flight with her oncologist.  Consider using Eliquis 2.5 mg.

## 2014-10-15 ENCOUNTER — Ambulatory Visit: Payer: Medicare Other | Attending: General Surgery | Admitting: Physical Therapy

## 2014-10-15 DIAGNOSIS — C50511 Malignant neoplasm of lower-outer quadrant of right female breast: Secondary | ICD-10-CM | POA: Diagnosis not present

## 2014-10-15 DIAGNOSIS — I972 Postmastectomy lymphedema syndrome: Secondary | ICD-10-CM | POA: Diagnosis not present

## 2014-10-15 DIAGNOSIS — I890 Lymphedema, not elsewhere classified: Secondary | ICD-10-CM

## 2014-10-15 DIAGNOSIS — Z9013 Acquired absence of bilateral breasts and nipples: Secondary | ICD-10-CM | POA: Insufficient documentation

## 2014-10-15 DIAGNOSIS — R0789 Other chest pain: Secondary | ICD-10-CM

## 2014-10-15 NOTE — Therapy (Signed)
[place], [place], [address] Phone: [phone]   Fax:  [phone]  Physical Therapy Treatment  Patient Details  Name: Jean Nash MRN: [id number] Date of Birth: January 18, 1941  Encounter Date: 10/15/2014      PT End of Session - 10/15/14 1636    Visit Number 8   Number of Visits 8   Date for PT Re-Evaluation 11/14/14   PT Start Time 5462   PT Stop Time 1522   PT Time Calculation (min) 47 min      Past Medical History  Diagnosis Date  . Chest pain, atypical     Negative stress test 02/2009  . Other and unspecified hyperlipidemia   . Personal history of urinary calculi   . Pain in joint, shoulder region   . Irritable bowel syndrome   . Chronic insomnia   . GERD (gastroesophageal reflux disease)   . History of colonic polyps   . Fibromyalgia   . Hypothyroidism   . Other abnormal glucose   . Stress incontinence, female   . Macular degeneration   . Cluster headaches     history of migraines  . Meniscus tear     history of right knee torn meniscus  . Arthritis   . Kidney stone   . Cataract   . Depression   . Pneumonia 7035,0093  . Asthma     hx of -no inhalers, no problems  . H/O hiatal hernia   . Breast cancer 08/08/13    right LOQ  . Hx of radiation therapy 10/29/13- 12/14/13    right chest wall 5040 cGy 28 sessions, right supraclavicular/axillary region 5040 cGy 28 sessions, right chest wall boost 1000 cGy 5 sessions  . Psoriasis     mild    Past Surgical History  Procedure Laterality Date  . Appendectomy    . Cholecystectomy    . Abdominal hysterectomy    . Tonsillectomy    . Eye surgery      to repair macular hole  . Cataract extraction, bilateral    . Knee arthroscopy      > 10 years ago  . Joint replacement  03/15/11    left knee replacement  . Colonoscopy    . Ganglion cyst excision      rt foot  . Foot arthroplasty      lt   . Mass excision  11/04/2011    Procedure:  EXCISION MASS;  Surgeon: Cammie Sickle., MD;  Location: [facility];  Service: Orthopedics;  Laterality: Right;  excisional biopsy right ulna mass  . Hemorrhoid surgery      03/1993  . Breast cyst aspiration      9 cysts  . Toe surgery      preventative crossover toe surg/right foot  . Toe surgery      left foot/screw  in 2nd toe  . Upper gastrointestinal endoscopy    . Skin tags removed      breast, panty line, neckline  . Bilateral total mastectomy with axillary lymph node dissection  08/30/2013    Dr Barry Dienes  . Simple mastectomy with axillary sentinel node biopsy Left 08/30/2013    Procedure: Bilateral Breast Mastectomy ;  Surgeon: Stark Klein, MD;  Location: [facility];  Service: General;  Laterality: Left;  Marland Kitchen Mastectomy w/ sentinel node biopsy Right 08/30/2013    Procedure: RIGHT  AXILLARY SENTINEL LYMPH NODE BIOPSY; Right Axillary Node Disection;  Surgeon: Stark Klein, MD;  Location[facility];  Service: General;  Laterality: Right;  Right side nuc med 7:00   . Evacuation breast hematoma Left 08/31/2013    Procedure: EVACUATION HEMATOMA BREAST;  Surgeon: Stark Klein, MD;  Location: [place];  Service: General;  Laterality: Left;    There were no vitals taken for this visit.  Visit Diagnosis:  Tightness in chest  Lymphedema  S/P bilateral mastectomy      Subjective Assessment - 10/15/14 1442    Symptoms Foam pad keeps shifting; it won't stay in place.   Currently in Pain? Yes   Pain Score 4    Pain Location Hand   Pain Descriptors / Indicators --  arthritis pain   Aggravating Factors  weather   Pain Relieving Factors unsure            [facility] Adult PT Treatment/Exercise - 10/15/14 0001    Manual Therapy   Manual Therapy Myofascial release;Manual Lymphatic Drainage (MLD)   Myofascial Release Pect minor stretches bilaterally.  Soft tissue mobilization across chest, especially right side.  Myofascial crosshands technique horizontal, vertical, and diagonal  across chest.   Manual Lymphatic Drainage (MLD) Short neck, superficial and deep abdomen, left axilla and anterior interaxillary anastomosis, right groin and axillo-inguinal anastomosis, and at area of dog-ear, directing to pathways; also did left groin and axillo-inguinal anastomosis and area of dog-ear on left.     Also, fashioned a new foam pad for patient to wear between skin and compression shirt, this time cutting a hole in 1/2 inch gray foam so that it surrounds the right arm completely and lies against the dog-ear and all the way around the shoulder/axilla.  This still requires adjustment under the shirt to make sure it is lying in the correct orientation, but patient was satisfied with it today.           Plan - 10/15/14 1636    Clinical Impression Statement Has now gotten more than one compression shirt, but having difficulty keeping foam pad in proper place for compression of dog-ear at right lateral chest.  She is compliant about wearing that.   Pt will benefit from skilled therapeutic intervention in order to improve on the following deficits Increased edema;Pain;Increased fascial restricitons   Rehab Potential Excellent   PT Frequency 2x / week   PT Duration 4 weeks   PT Treatment/Interventions Manual techniques;Manual lymph drainage;Patient/family education;Compression bandaging   PT Next Visit Plan Consider taking another photograph for comparison to initial evaluation.  continue myofascial release and manual lymph drainage.  Adjust foam piece as needed.   Consulted and Agree with Plan of Care Patient   PT Plan Continue to adjust foam piece to be worn under compression shirt; myofascial release for chest tightness and manual lymph drainage for right > left side.                             Short Term Clinic Goals - 10/15/14 1641    CC Short Term Goal  #2   Status On-going         Long Term Clinic Goals - 10/15/14 1642    CC Long Term Goal  #1    Status Achieved   CC Long Term Goal  #2   Status On-going         Problem List Patient Active Problem List   Diagnosis Date Noted  . Atypical chest pain 07/15/2014  . Arthralgia 02/22/2014  . Psoriasis 02/22/2014  .  Breast cancer of lower-outer quadrant of right female breast, s/p bilateral mastectomy, R ALND 08/2013 08/09/2013  . Neck pain 06/22/2013  . Left knee pain 11/29/2012  . Cluster headache 10/04/2012  . Gastroenteritis 04/26/2012  . Reactive depression (situational) 04/26/2012  . Right wrist pain 02/02/2012  . Right foot pain 10/04/2011  . Tinea capitis 10/04/2011  . Skin moles 06/05/2011  . Status post total knee replacement 04/06/2011  . Overactive bladder 03/14/2011  . MOLE 12/21/2010  . KNEE PAIN, LEFT 08/31/2010  . HEARING LOSS 08/17/2010  . PSORIASIS 02/26/2010  . HIRSUTISM 06/16/2009  . SHOULDER PAIN, LEFT 11/21/2008  . HELICOBACTER PYLORI INFECTION, HX OF 06/18/2008  . CYST, IDIOPATHIC 05/20/2008  . FOOT PAIN 04/22/2008  . IRRITABLE BOWEL SYNDROME 04/15/2008  . ASTHMA 03/19/2008  . GERD 03/19/2008  . COLONIC POLYPS, HX OF 03/19/2008  . NEPHROLITHIASIS, HX OF 03/19/2008  . Hypothyroidism 03/18/2008  . HYPERLIPIDEMIA 03/18/2008  . INSOMNIA, CHRONIC 03/18/2008  . FIBROMYALGIA 03/18/2008  . Prediabetes 03/18/2008    SALISBURY,DONNA 10/15/2014, 4:43 PM   SALISBURY,DONNA, PT

## 2014-10-16 DIAGNOSIS — C50511 Malignant neoplasm of lower-outer quadrant of right female breast: Secondary | ICD-10-CM | POA: Diagnosis not present

## 2014-10-17 ENCOUNTER — Encounter: Payer: Self-pay | Admitting: Oncology

## 2014-10-17 ENCOUNTER — Ambulatory Visit: Payer: Medicare Other | Admitting: Physical Therapy

## 2014-10-17 NOTE — Telephone Encounter (Signed)
Printed along with Part 1 for provider review.

## 2014-10-17 NOTE — Telephone Encounter (Signed)
Printed for provider review.

## 2014-10-18 NOTE — Telephone Encounter (Signed)
SPOKE WITH DR.MAGRINAT'S NURSE, VAL DODD,RN. SHE WILL COMPLETE THIS TASK.

## 2014-10-22 ENCOUNTER — Ambulatory Visit: Payer: Medicare Other | Admitting: Physical Therapy

## 2014-10-22 ENCOUNTER — Other Ambulatory Visit: Payer: Self-pay | Admitting: Oncology

## 2014-10-22 DIAGNOSIS — C50919 Malignant neoplasm of unspecified site of unspecified female breast: Secondary | ICD-10-CM

## 2014-10-22 DIAGNOSIS — C50511 Malignant neoplasm of lower-outer quadrant of right female breast: Secondary | ICD-10-CM

## 2014-10-22 DIAGNOSIS — R0789 Other chest pain: Secondary | ICD-10-CM

## 2014-10-22 DIAGNOSIS — Z9013 Acquired absence of bilateral breasts and nipples: Secondary | ICD-10-CM

## 2014-10-22 DIAGNOSIS — I890 Lymphedema, not elsewhere classified: Secondary | ICD-10-CM

## 2014-10-23 ENCOUNTER — Telehealth: Payer: Self-pay | Admitting: Oncology

## 2014-10-23 NOTE — Therapy (Signed)
[name], [place], [address] Phone: [phone]   Fax:  [phone]  Physical Therapy Treatment  Patient Details  Name: Jean Nash MRN: [id number] Date of Birth: Mar 30, 1941  Encounter Date: 10/22/2014      PT End of Session - 10/22/14 1725    Visit Number 9   Number of Visits 16   Date for PT Re-Evaluation 11/29/14   PT Start Time 2229   PT Stop Time 1600   PT Time Calculation (min) 45 min      Past Medical History  Diagnosis Date  . Chest pain, atypical     Negative stress test 02/2009  . Other and unspecified hyperlipidemia   . Personal history of urinary calculi   . Pain in joint, shoulder region   . Irritable bowel syndrome   . Chronic insomnia   . GERD (gastroesophageal reflux disease)   . History of colonic polyps   . Fibromyalgia   . Hypothyroidism   . Other abnormal glucose   . Stress incontinence, female   . Macular degeneration   . Cluster headaches     history of migraines  . Meniscus tear     history of right knee torn meniscus  . Arthritis   . Kidney stone   . Cataract   . Depression   . Pneumonia 7989,2119  . Asthma     hx of -no inhalers, no problems  . H/O hiatal hernia   . Breast cancer 08/08/13    right LOQ  . Hx of radiation therapy 10/29/13- 12/14/13    right chest wall 5040 cGy 28 sessions, right supraclavicular/axillary region 5040 cGy 28 sessions, right chest wall boost 1000 cGy 5 sessions  . Psoriasis     mild    Past Surgical History  Procedure Laterality Date  . Appendectomy    . Cholecystectomy    . Abdominal hysterectomy    . Tonsillectomy    . Eye surgery      to repair macular hole  . Cataract extraction, bilateral    . Knee arthroscopy      > 10 years ago  . Joint replacement  03/15/11    left knee replacement  . Colonoscopy    . Ganglion cyst excision      rt foot  . Foot arthroplasty      lt   . Mass excision  11/04/2011    Procedure:  EXCISION MASS;  Surgeon: Cammie Sickle., MD;  Location: [facility];  Service: Orthopedics;  Laterality: Right;  excisional biopsy right ulna mass  . Hemorrhoid surgery      03/1993  . Breast cyst aspiration      9 cysts  . Toe surgery      preventative crossover toe surg/right foot  . Toe surgery      left foot/screw  in 2nd toe  . Upper gastrointestinal endoscopy    . Skin tags removed      breast, panty line, neckline  . Bilateral total mastectomy with axillary lymph node dissection  08/30/2013    Dr Barry Dienes  . Simple mastectomy with axillary sentinel node biopsy Left 08/30/2013    Procedure: Bilateral Breast Mastectomy ;  Surgeon: Stark Klein, MD;  Location: [place];  Service: General;  Laterality: Left;  Marland Kitchen Mastectomy w/ sentinel node biopsy Right 08/30/2013    Procedure: RIGHT  AXILLARY SENTINEL LYMPH NODE BIOPSY; Right Axillary Node Disection;  Surgeon: Stark Klein, MD;  Location: [facility]  OR;  Service: General;  Laterality: Right;  Right side nuc med 7:00   . Evacuation breast hematoma Left 08/31/2013    Procedure: EVACUATION HEMATOMA BREAST;  Surgeon: Stark Klein, MD;  Location: [place];  Service: General;  Laterality: Left;    There were no vitals taken for this visit.  Visit Diagnosis:  Tightness in chest  Lymphedema  S/P bilateral mastectomy  Breast cancer of lower-outer quadrant of right female breast      Subjective Assessment - 10/23/14 1717    Symptoms pt had to cut foam pad and reattach it with safety pins to keep it in place.  she states she went to the plastic surgeon who will be able to do surgery to decreased her excess tissue areas when she gets back from [place] in February.  She feels that she is getting some reduction from treatment   Currently in Pain? Yes   Pain Score 3    Pain Location --  "I hurt all over"     Manual lymph drainage in supine as follows: short neck, left axillary nodes, right inguinal nodes, superficial and deep  abdominals; anterior inter-axillary anastamoses, right axillo-inguinal anastamoses. Emphasis on right axilla and lateral chest. Myofascial release With cross hands techniques in diagonal, horizontal and vertical directions.         Plan - 10/23/14 1728    PT Next Visit Plan Consider taking another photograph for comparison to initial evaluation.  continue myofascial release and manual lymph drainage.  Adjust foam piece as needed. Gcode next             Short Term Clinic Goals - 10/23/14 1726    CC Short Term Goal  #1   Title report overall pain decreased >/=25 percent to otlerate daily tasks with less pain   Time 6   Period Weeks   Status Achieved   CC Short Term Goal  #2   Title reduce  edema by perceived 25% at right dog-ear and inferior to right axilla   Time 6   Period Weeks   Status On-going         Long Term Clinic Goals - 10/23/14 1727    CC Long Term Goal  #1   Title verbalize good undestanding of the manintenance phase of treatment including manyal lymph drainage, se of compression and lymphedema risk reduction practices   Time 4   Period Weeks   Status On-going   CC Long Term Goal  #2   Title reduce edema by perceived 50% at right dog-ear and inferior to right axilla   Time 4   Period Weeks   Status On-going   CC Long Term Goal  #3   Title report overall pain decreased >/= 50 percent to tolerate daily tasks with less pain   Time 4   Period Weeks   Status Achieved         Problem List Patient Active Problem List   Diagnosis Date Noted  . Atypical chest pain 07/15/2014  . Arthralgia 02/22/2014  . Psoriasis 02/22/2014  . Breast cancer of lower-outer quadrant of right female breast, s/p bilateral mastectomy, R ALND 08/2013 08/09/2013  . Neck pain 06/22/2013  . Left knee pain 11/29/2012  . Cluster headache 10/04/2012  . Gastroenteritis 04/26/2012  . Reactive depression (situational) 04/26/2012  . Right wrist pain 02/02/2012  . Right foot pain  10/04/2011  . Tinea capitis 10/04/2011  . Skin moles 06/05/2011  . Status post total knee replacement 04/06/2011  .  Overactive bladder 03/14/2011  . MOLE 12/21/2010  . KNEE PAIN, LEFT 08/31/2010  . HEARING LOSS 08/17/2010  . PSORIASIS 02/26/2010  . HIRSUTISM 06/16/2009  . SHOULDER PAIN, LEFT 11/21/2008  . HELICOBACTER PYLORI INFECTION, HX OF 06/18/2008  . CYST, IDIOPATHIC 05/20/2008  . FOOT PAIN 04/22/2008  . IRRITABLE BOWEL SYNDROME 04/15/2008  . ASTHMA 03/19/2008  . GERD 03/19/2008  . COLONIC POLYPS, HX OF 03/19/2008  . NEPHROLITHIASIS, HX OF 03/19/2008  . Hypothyroidism 03/18/2008  . HYPERLIPIDEMIA 03/18/2008  . INSOMNIA, CHRONIC 03/18/2008  . FIBROMYALGIA 03/18/2008  . Prediabetes 03/18/2008   Donato Heinz. Owens Shark, PT   10/23/2014, 5:29 PM

## 2014-10-23 NOTE — Telephone Encounter (Signed)
, °

## 2014-10-24 ENCOUNTER — Other Ambulatory Visit: Payer: Self-pay | Admitting: Oncology

## 2014-10-24 ENCOUNTER — Encounter: Payer: BLUE CROSS/BLUE SHIELD | Admitting: Physical Therapy

## 2014-10-24 ENCOUNTER — Encounter: Payer: Self-pay | Admitting: Oncology

## 2014-10-25 ENCOUNTER — Ambulatory Visit: Payer: Medicare Other | Admitting: Physical Therapy

## 2014-10-25 DIAGNOSIS — C50511 Malignant neoplasm of lower-outer quadrant of right female breast: Secondary | ICD-10-CM

## 2014-10-25 DIAGNOSIS — R0789 Other chest pain: Secondary | ICD-10-CM

## 2014-10-25 DIAGNOSIS — I890 Lymphedema, not elsewhere classified: Secondary | ICD-10-CM

## 2014-10-25 DIAGNOSIS — Z9013 Acquired absence of bilateral breasts and nipples: Secondary | ICD-10-CM

## 2014-10-25 IMAGING — CT CT CHEST W/ CM
2 of 4 series · 15 of 36 positions shown, 18 images · IV contrast (omnipaque)
Comparison: 01/21/2009

CLINICAL DATA: Breast cancer diagnosed in 0546 with double
mastectomy. No treatment. Pre radiation.

EXAM:
CT CHEST WITH CONTRAST
TECHNIQUE: Multidetector CT imaging of the chest was performed during
intravenous contrast administration.
CONTRAST:  80mL OMNIPAQUE IOHEXOL 300 MG/ML  SOLN

[Series 2: rtn chest with st · axial · 0.88mm/px · z∈[+1124,+1344]mm · 12 of 53 slices shown, 15 images]
[im 5/53  mediastinal]
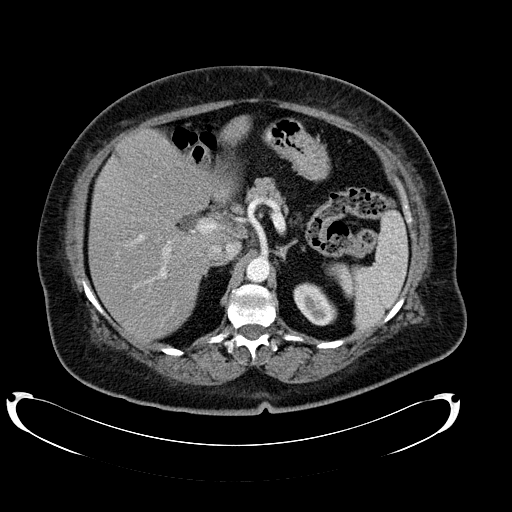
[im 5/53  lung]
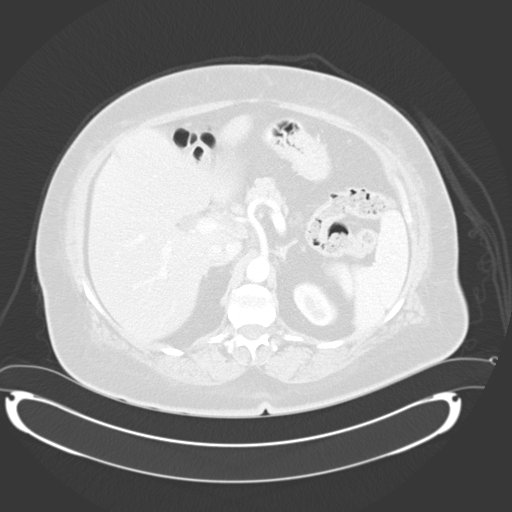
[im 9/53  lung]
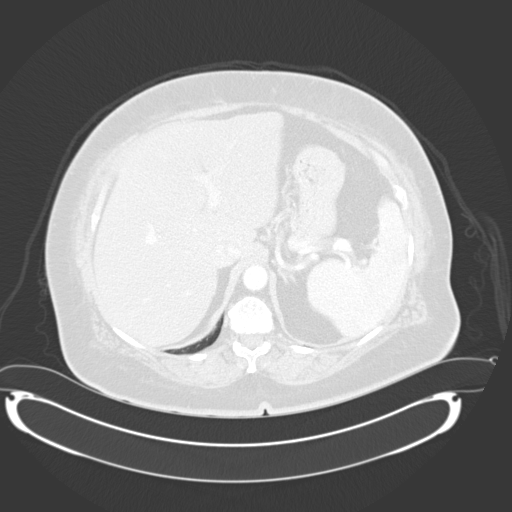
[im 13/53  lung]
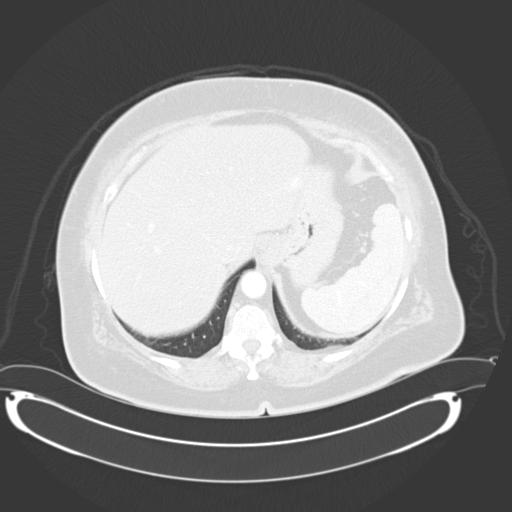
[im 17/53  lung]
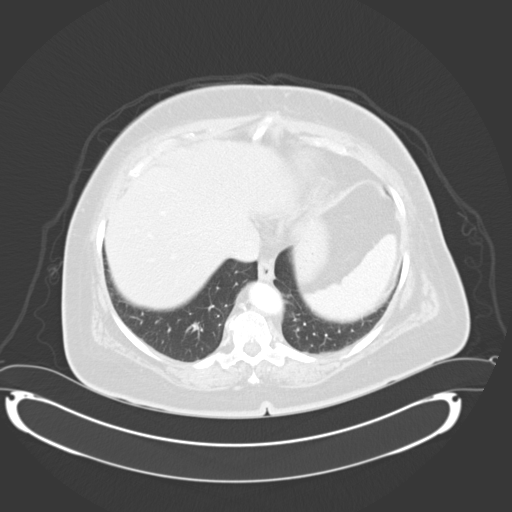
[im 21/53  mediastinal]
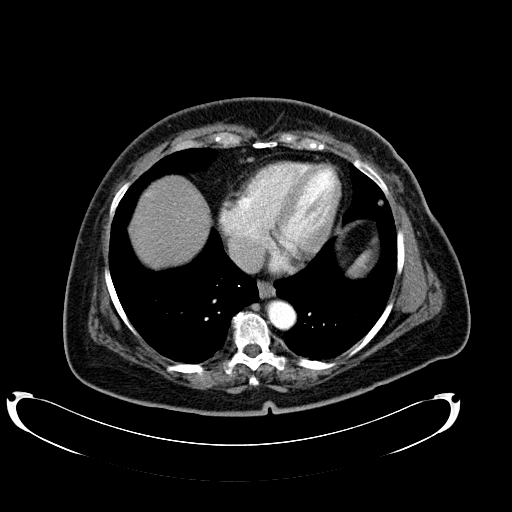
[im 21/53  lung]
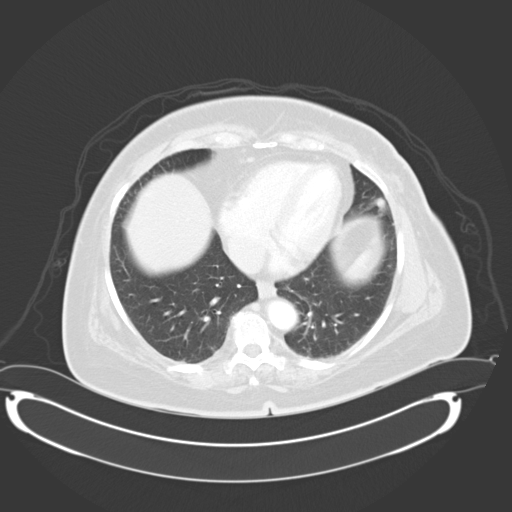
[im 25/53  lung]
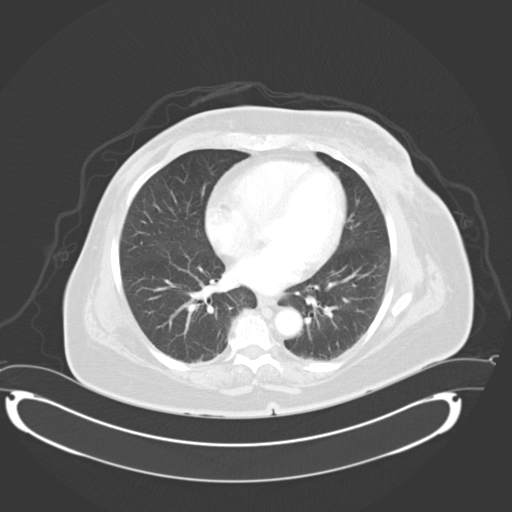
[im 29/53  lung]
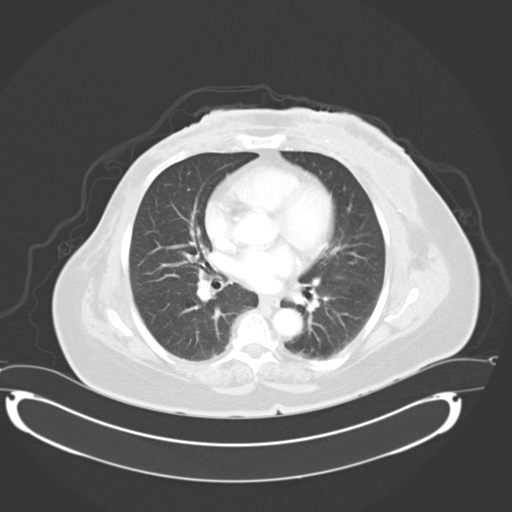
[im 33/53  lung]
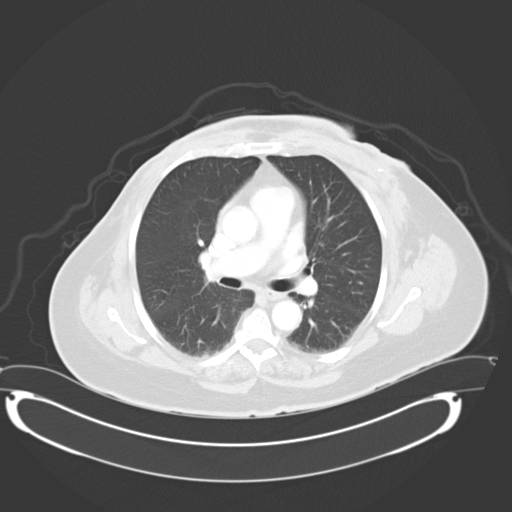
[im 37/53  mediastinal]
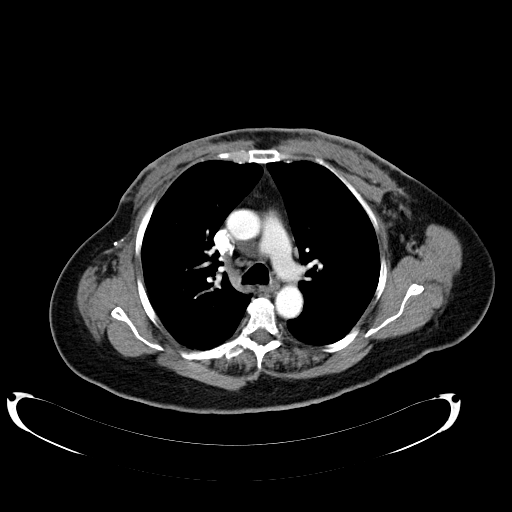
[im 37/53  lung]
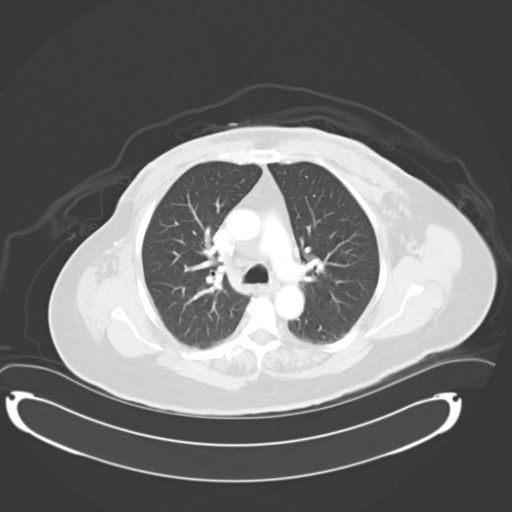
[im 41/53  lung]
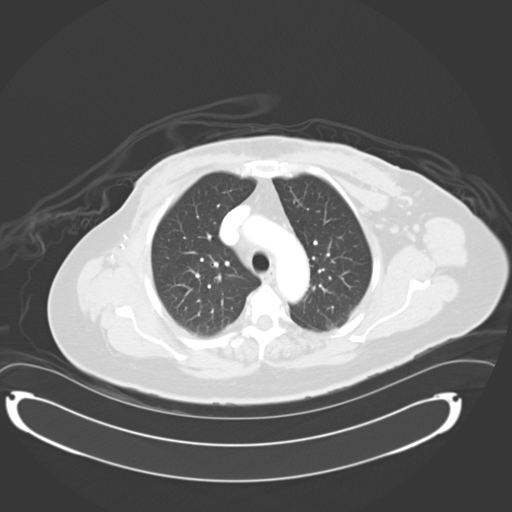
[im 45/53  lung]
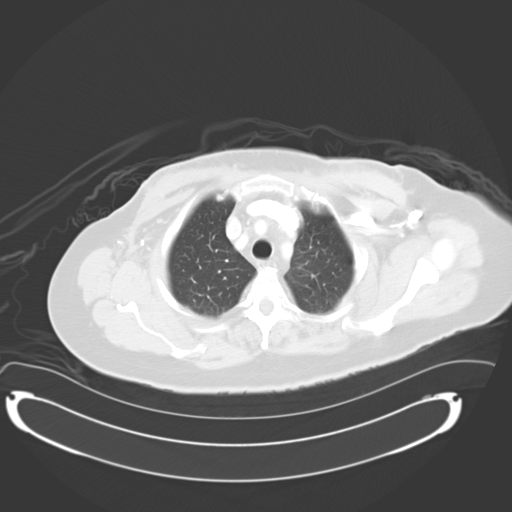
[im 49/53  lung]
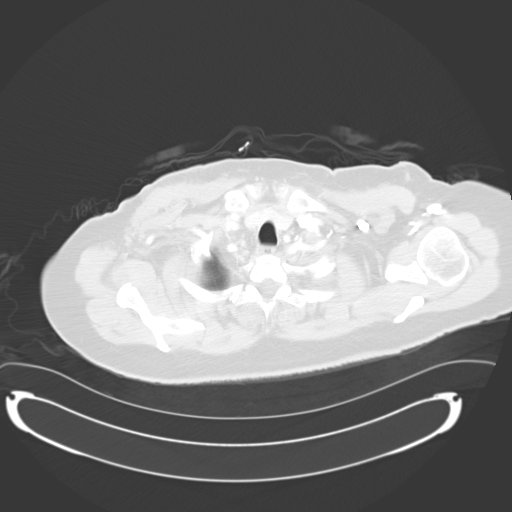

[Series 602: cor · coronal · 0.88mm/px · 3 of 102 slices shown]
[im 21/102  lung]
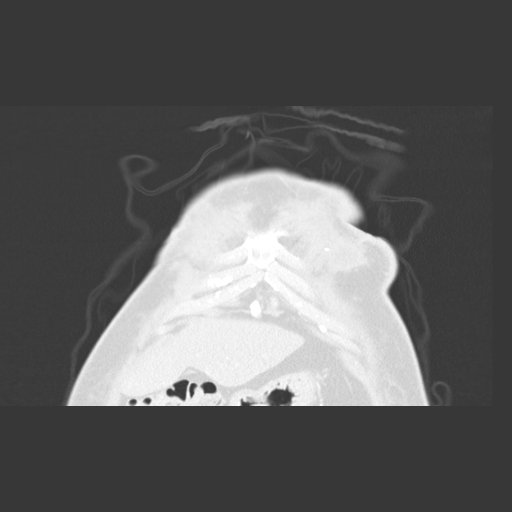
[im 41/102  lung]
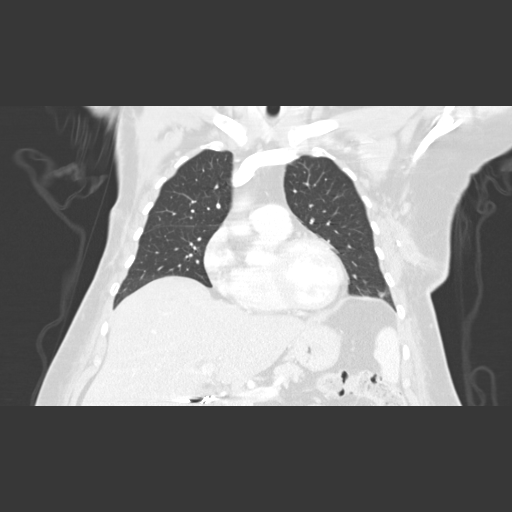
[im 61/102  lung]
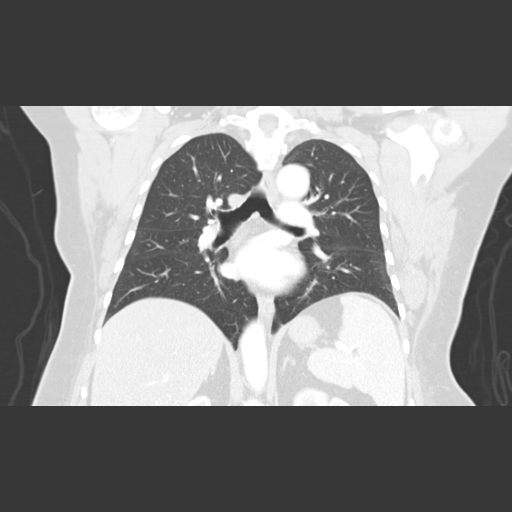

[15 of 36 positions shown; findings below may reference images not displayed]

FINDINGS: Lungs/Pleura: Apparent 6 mm nodular density within the lingula which
is somewhat wedge-shaped on sagittal reformats. Transverse image 33
and sagittal image 90. Felt to be present on image 47 of the 2292
study.

No pleural fluid.

Heart/Mediastinum: No supraclavicular adenopathy. Status post right
axillary node dissection. No axillary adenopathy. Small left-sided
axillary and subpectoral nodes are identified, including on image
14/series 2.

Status post bilateral mastectomy. A fluid collection within the left
chest wall measures 2.0 by 9.6 cm on image 26. Peripheral Mild
enhancement.

Descending thoracic aortic tortuosity with atherosclerosis. Mild
cardiomegaly. No central pulmonary embolism, on this non-dedicated
study.

No mediastinal or hilar adenopathy.  No internal mammary adenopathy.

Upper Abdomen: Atypical appearance to the inferior aspect of the
left lobe of the liver, with an irregular hepatic capsule suggested
on image 47/series 2. Scattered foci of subtle hepatic hyper
enhancement, including on images 39 and 40 of series 2. Normal
imaged portions of adrenal glands. Cholecystectomy. Incompletely
imaged common duct, which measures upper normal, 11 mm.

Bones/Musculoskeletal: No acute osseous abnormality. Mild
accentuation of expected thoracic kyphosis.
IMPRESSION: 1. Status post bilateral mastectomy and right axillary node
dissection. No evidence of metastatic disease. A left chest wall
fluid collection could represent a postoperative seroma or hematoma.
2. Probable scarring within the lingula. Recommend attention on
follow-up.
3. Atypical appearance of the inferior left lobe of the liver. This
could relate to atrophy or mild cirrhosis. Correlate with risk
factors for the latter.
4. Probable perfusion anomalies within the left lobe of the liver.

## 2014-10-25 NOTE — Therapy (Signed)
[place], [place], [address] Phone: [phone]   Fax:  [phone]  Physical Therapy Treatment  Patient Details  Name: Jean Nash MRN: [id number] Date of Birth: 1941/04/18  Encounter Date: 10/25/2014      PT End of Session - 10/25/14 1226    Visit Number 10   Number of Visits 16   Date for PT Re-Evaluation 11/29/14   PT Start Time 1110   PT Stop Time 1155   PT Time Calculation (min) 45 min      Past Medical History  Diagnosis Date  . Chest pain, atypical     Negative stress test 02/2009  . Other and unspecified hyperlipidemia   . Personal history of urinary calculi   . Pain in joint, shoulder region   . Irritable bowel syndrome   . Chronic insomnia   . GERD (gastroesophageal reflux disease)   . History of colonic polyps   . Fibromyalgia   . Hypothyroidism   . Other abnormal glucose   . Stress incontinence, female   . Macular degeneration   . Cluster headaches     history of migraines  . Meniscus tear     history of right knee torn meniscus  . Arthritis   . Kidney stone   . Cataract   . Depression   . Pneumonia 5170,0174  . Asthma     hx of -no inhalers, no problems  . H/O hiatal hernia   . Breast cancer 08/08/13    right LOQ  . Hx of radiation therapy 10/29/13- 12/14/13    right chest wall 5040 cGy 28 sessions, right supraclavicular/axillary region 5040 cGy 28 sessions, right chest wall boost 1000 cGy 5 sessions  . Psoriasis     mild    Past Surgical History  Procedure Laterality Date  . Appendectomy    . Cholecystectomy    . Abdominal hysterectomy    . Tonsillectomy    . Eye surgery      to repair macular hole  . Cataract extraction, bilateral    . Knee arthroscopy      > 10 years ago  . Joint replacement  03/15/11    left knee replacement  . Colonoscopy    . Ganglion cyst excision      rt foot  . Foot arthroplasty      lt   . Mass excision  11/04/2011    Procedure:  EXCISION MASS;  Surgeon: Cammie Sickle., MD;  Location: [place] [facility];  Service: Orthopedics;  Laterality: Right;  excisional biopsy right ulna mass  . Hemorrhoid surgery      03/1993  . Breast cyst aspiration      9 cysts  . Toe surgery      preventative crossover toe surg/right foot  . Toe surgery      left foot/screw  in 2nd toe  . Upper gastrointestinal endoscopy    . Skin tags removed      breast, panty line, neckline  . Bilateral total mastectomy with axillary lymph node dissection  08/30/2013    Dr Barry Dienes  . Simple mastectomy with axillary sentinel node biopsy Left 08/30/2013    Procedure: Bilateral Breast Mastectomy ;  Surgeon: Stark Klein, MD;  Location: [place];  Service: General;  Laterality: Left;  Marland Kitchen Mastectomy w/ sentinel node biopsy Right 08/30/2013    Procedure: RIGHT  AXILLARY SENTINEL LYMPH NODE BIOPSY; Right Axillary Node Disection;  Surgeon: Stark Klein, MD;  Location: [facility]  OR;  Service: General;  Laterality: Right;  Right side nuc med 7:00   . Evacuation breast hematoma Left 08/31/2013    Procedure: EVACUATION HEMATOMA BREAST;  Surgeon: Stark Klein, MD;  Location: [place];  Service: General;  Laterality: Left;    There were no vitals taken for this visit.  Visit Diagnosis:  Tightness in chest  Lymphedema  S/P bilateral mastectomy  Breast cancer of lower-outer quadrant of right female breast      Subjective Assessment - 11-11-2014 1223    Symptoms pt comes in today without foam pad. she does have compression shirt on.  She also has concerns about abdominal swelling (Question if she has some lymphedema in abdomen?)  she reports she had a root canal yesterday   Currently in Pain? Yes   Pain Score 3    Pain Location --  generalized.       Treatment  Manual lymph drainage in supine as follows: short neck, left axillary nodes, right inguinal nodes, superficial and deep abdominals; anterior inter-axillary anastamoses, right axillo-inguinal  anastamoses. Emphasis on abdomen toward inguinal nodes and anterior axillary dog ears and also left anterior axiallary dog ear.  Discussed need to continue with shoulder range of motion exercise and to add recumbant bike or walking. Applied 2 pieces of 3 pronged fan shaped kinesiotape:  One with at right lateral chest and edematous area under arm with solid piece of tape going toward groin, and another directed horizontally with solid piece of tape at sternum and fans at anterior axillary dog ear.         Plan - 11/11/2014 1227    Clinical Impression Statement Edema continues in anterior axilla dog ears and questionalby abdomen.  Pt is not able to identify that wearing compression shirts is changing abdomen size. She was encouraged to continue shoulder range of motion and aerobic exercise at home. GCode done   PT Next Visit Plan Consider taking another photograph for comparison to initial evaluation. Inquire if patient saw any effect from kinesiotape.   continue myofascial release and manual lymph drainage with emphasis on abdominal decongestion.Encouage range of motion and exercise          G-Codes - 2014/11/11 1231    Functional Assessment Tool Used clinical judgement   Functional Limitation Other PT primary   Other PT Primary Current Status (V9563) At least 20 percent but less than 40 percent impaired, limited or restricted   Other PT Primary Goal Status (O7564) At least 1 percent but less than 20 percent impaired, limited or restricted       Problem List Patient Active Problem List   Diagnosis Date Noted  . Atypical chest pain 07/15/2014  . Arthralgia 02/22/2014  . Psoriasis 02/22/2014  . Breast cancer of lower-outer quadrant of right female breast, s/p bilateral mastectomy, R ALND 08/2013 08/09/2013  . Neck pain 06/22/2013  . Left knee pain 11/29/2012  . Cluster headache 10/04/2012  . Gastroenteritis 04/26/2012  . Reactive depression (situational) 04/26/2012  . Right wrist pain  02/02/2012  . Right foot pain 10/04/2011  . Tinea capitis 10/04/2011  . Skin moles 06/05/2011  . Status post total knee replacement 04/06/2011  . Overactive bladder 03/14/2011  . MOLE 12/21/2010  . KNEE PAIN, LEFT 08/31/2010  . HEARING LOSS 08/17/2010  . PSORIASIS 02/26/2010  . HIRSUTISM 06/16/2009  . SHOULDER PAIN, LEFT 11/21/2008  . HELICOBACTER PYLORI INFECTION, HX OF 06/18/2008  . CYST, IDIOPATHIC 05/20/2008  . FOOT PAIN 04/22/2008  . IRRITABLE BOWEL SYNDROME  04/15/2008  . ASTHMA 03/19/2008  . GERD 03/19/2008  . COLONIC POLYPS, HX OF 03/19/2008  . NEPHROLITHIASIS, HX OF 03/19/2008  . Hypothyroidism 03/18/2008  . HYPERLIPIDEMIA 03/18/2008  . INSOMNIA, CHRONIC 03/18/2008  . FIBROMYALGIA 03/18/2008  . Prediabetes 03/18/2008   Donato Heinz. Owens Shark, PT  10/25/2014, 12:35 PM

## 2014-10-28 ENCOUNTER — Ambulatory Visit: Payer: Medicare Other

## 2014-10-28 ENCOUNTER — Encounter: Payer: Self-pay | Admitting: Internal Medicine

## 2014-10-28 DIAGNOSIS — I890 Lymphedema, not elsewhere classified: Secondary | ICD-10-CM

## 2014-10-28 DIAGNOSIS — C50511 Malignant neoplasm of lower-outer quadrant of right female breast: Secondary | ICD-10-CM

## 2014-10-28 DIAGNOSIS — Z9013 Acquired absence of bilateral breasts and nipples: Secondary | ICD-10-CM

## 2014-10-28 DIAGNOSIS — R0789 Other chest pain: Secondary | ICD-10-CM

## 2014-10-28 NOTE — Therapy (Signed)
[place], [place], [address] Phone: [phone]   Fax:  [phone]  Physical Therapy Treatment  Patient Details  Name: Jean Nash MRN: [id number] Date of Birth: 08/19/1941  Encounter Date: 10/28/2014      PT End of Session - 10/28/14 1108    Visit Number 11   Number of Visits 16   Date for PT Re-Evaluation 11/29/14   PT Start Time 1022   PT Stop Time 1105   PT Time Calculation (min) 43 min      Past Medical History  Diagnosis Date  . Chest pain, atypical     Negative stress test 02/2009  . Other and unspecified hyperlipidemia   . Personal history of urinary calculi   . Pain in joint, shoulder region   . Irritable bowel syndrome   . Chronic insomnia   . GERD (gastroesophageal reflux disease)   . History of colonic polyps   . Fibromyalgia   . Hypothyroidism   . Other abnormal glucose   . Stress incontinence, female   . Macular degeneration   . Cluster headaches     history of migraines  . Meniscus tear     history of right knee torn meniscus  . Arthritis   . Kidney stone   . Cataract   . Depression   . Pneumonia 0258,5277  . Asthma     hx of -no inhalers, no problems  . H/O hiatal hernia   . Breast cancer 08/08/13    right LOQ  . Hx of radiation therapy 10/29/13- 12/14/13    right chest wall 5040 cGy 28 sessions, right supraclavicular/axillary region 5040 cGy 28 sessions, right chest wall boost 1000 cGy 5 sessions  . Psoriasis     mild    Past Surgical History  Procedure Laterality Date  . Appendectomy    . Cholecystectomy    . Abdominal hysterectomy    . Tonsillectomy    . Eye surgery      to repair macular hole  . Cataract extraction, bilateral    . Knee arthroscopy      > 10 years ago  . Joint replacement  03/15/11    left knee replacement  . Colonoscopy    . Ganglion cyst excision      rt foot  . Foot arthroplasty      lt   . Mass excision  11/04/2011    Procedure:  EXCISION MASS;  Surgeon: Cammie Sickle., MD;  Location: [facility];  Service: Orthopedics;  Laterality: Right;  excisional biopsy right ulna mass  . Hemorrhoid surgery      03/1993  . Breast cyst aspiration      9 cysts  . Toe surgery      preventative crossover toe surg/right foot  . Toe surgery      left foot/screw  in 2nd toe  . Upper gastrointestinal endoscopy    . Skin tags removed      breast, panty line, neckline  . Bilateral total mastectomy with axillary lymph node dissection  08/30/2013    Dr Barry Dienes  . Simple mastectomy with axillary sentinel node biopsy Left 08/30/2013    Procedure: Bilateral Breast Mastectomy ;  Surgeon: Stark Klein, MD;  Location: [facility];  Service: General;  Laterality: Left;  Marland Kitchen Mastectomy w/ sentinel node biopsy Right 08/30/2013    Procedure: RIGHT  AXILLARY SENTINEL LYMPH NODE BIOPSY; Right Axillary Node Disection;  Surgeon: Stark Klein, MD;  Location: [facility]  OR;  Service: General;  Laterality: Right;  Right side nuc med 7:00   . Evacuation breast hematoma Left 08/31/2013    Procedure: EVACUATION HEMATOMA BREAST;  Surgeon: Stark Klein, MD;  Location: [facility];  Service: General;  Laterality: Left;    There were no vitals taken for this visit.  Visit Diagnosis:  Tightness in chest  Lymphedema  S/P bilateral mastectomy  Breast cancer of lower-outer quadrant of right female breast      Subjective Assessment - 10/28/14 1023    Symptoms Pt still with kinesiotape on from last treatment and reports this seems very beneficial.             [facility] Adult PT Treatment/Exercise - 10/28/14 0001    Manual Therapy   Myofascial Release To Rt chest wall with cross hands technique vertical and horizontally.   Manual Lymphatic Drainage (MLD) In supine, short neck, Lt axilla and Rt inguinal nodes, superficial and deep abdominals, anterior inter-axillary and Rt axillo-inguinal anastomosis, and Rt UE from dorsal hand to lateral shoulder, focusing on  abdomen.                Plan - 10/28/14 1112    PT Next Visit Plan Instruct pts husband in [place]. Consider taking another photograph for comparison to initial evaluation. Continue myofascial release and manual lymph drainage with emphasis on abdominal decongestion.Encouage range of motion and exercise   Consulted and Agree with Plan of Care Patient                               Problem List Patient Active Problem List   Diagnosis Date Noted  . Atypical chest pain 07/15/2014  . Arthralgia 02/22/2014  . Psoriasis 02/22/2014  . Breast cancer of lower-outer quadrant of right female breast, s/p bilateral mastectomy, R ALND 08/2013 08/09/2013  . Neck pain 06/22/2013  . Left knee pain 11/29/2012  . Cluster headache 10/04/2012  . Gastroenteritis 04/26/2012  . Reactive depression (situational) 04/26/2012  . Right wrist pain 02/02/2012  . Right foot pain 10/04/2011  . Tinea capitis 10/04/2011  . Skin moles 06/05/2011  . Status post total knee replacement 04/06/2011  . Overactive bladder 03/14/2011  . MOLE 12/21/2010  . KNEE PAIN, LEFT 08/31/2010  . HEARING LOSS 08/17/2010  . PSORIASIS 02/26/2010  . HIRSUTISM 06/16/2009  . SHOULDER PAIN, LEFT 11/21/2008  . HELICOBACTER PYLORI INFECTION, HX OF 06/18/2008  . CYST, IDIOPATHIC 05/20/2008  . FOOT PAIN 04/22/2008  . IRRITABLE BOWEL SYNDROME 04/15/2008  . ASTHMA 03/19/2008  . GERD 03/19/2008  . COLONIC POLYPS, HX OF 03/19/2008  . NEPHROLITHIASIS, HX OF 03/19/2008  . Hypothyroidism 03/18/2008  . HYPERLIPIDEMIA 03/18/2008  . INSOMNIA, CHRONIC 03/18/2008  . FIBROMYALGIA 03/18/2008  . Prediabetes 03/18/2008    Otelia Limes, PTA 10/28/2014, 11:14 AM

## 2014-10-30 ENCOUNTER — Other Ambulatory Visit: Payer: Self-pay | Admitting: Oncology

## 2014-10-30 ENCOUNTER — Other Ambulatory Visit: Payer: Self-pay | Admitting: Internal Medicine

## 2014-10-30 ENCOUNTER — Ambulatory Visit (HOSPITAL_COMMUNITY)
Admission: RE | Admit: 2014-10-30 | Discharge: 2014-10-30 | Disposition: A | Discharge status: Home / Self Care | Payer: Medicare Other | Source: Ambulatory Visit | Attending: Oncology | Admitting: Oncology

## 2014-10-30 DIAGNOSIS — Z1273 Encounter for screening for malignant neoplasm of ovary: Secondary | ICD-10-CM | POA: Insufficient documentation

## 2014-10-30 DIAGNOSIS — C50919 Malignant neoplasm of unspecified site of unspecified female breast: Secondary | ICD-10-CM

## 2014-10-30 DIAGNOSIS — Z90710 Acquired absence of both cervix and uterus: Secondary | ICD-10-CM | POA: Diagnosis not present

## 2014-10-31 ENCOUNTER — Ambulatory Visit: Payer: Medicare Other

## 2014-10-31 DIAGNOSIS — C50511 Malignant neoplasm of lower-outer quadrant of right female breast: Secondary | ICD-10-CM

## 2014-10-31 DIAGNOSIS — I890 Lymphedema, not elsewhere classified: Secondary | ICD-10-CM

## 2014-10-31 DIAGNOSIS — R0789 Other chest pain: Secondary | ICD-10-CM

## 2014-10-31 DIAGNOSIS — Z9013 Acquired absence of bilateral breasts and nipples: Secondary | ICD-10-CM

## 2014-10-31 NOTE — Therapy (Signed)
[place], [place], [address] Phone: [phone]   Fax:  [phone]  Physical Therapy Treatment  Patient Details  Name: Jean Nash MRN: [id number] Date of Birth: 12-27-1940  Encounter Date: 10/31/2014      PT End of Session - 10/31/14 1519    Visit Number 12   Number of Visits 16   Date for PT Re-Evaluation 11/29/14   PT Start Time 1435   PT Stop Time 1518   PT Time Calculation (min) 43 min      Past Medical History  Diagnosis Date  . Chest pain, atypical     Negative stress test 02/2009  . Other and unspecified hyperlipidemia   . Personal history of urinary calculi   . Pain in joint, shoulder region   . Irritable bowel syndrome   . Chronic insomnia   . GERD (gastroesophageal reflux disease)   . History of colonic polyps   . Fibromyalgia   . Hypothyroidism   . Other abnormal glucose   . Stress incontinence, female   . Macular degeneration   . Cluster headaches     history of migraines  . Meniscus tear     history of right knee torn meniscus  . Arthritis   . Kidney stone   . Cataract   . Depression   . Pneumonia 9892,1194  . Asthma     hx of -no inhalers, no problems  . H/O hiatal hernia   . Breast cancer 08/08/13    right LOQ  . Hx of radiation therapy 10/29/13- 12/14/13    right chest wall 5040 cGy 28 sessions, right supraclavicular/axillary region 5040 cGy 28 sessions, right chest wall boost 1000 cGy 5 sessions  . Psoriasis     mild    Past Surgical History  Procedure Laterality Date  . Appendectomy    . Cholecystectomy    . Abdominal hysterectomy    . Tonsillectomy    . Eye surgery      to repair macular hole  . Cataract extraction, bilateral    . Knee arthroscopy      > 10 years ago  . Joint replacement  03/15/11    left knee replacement  . Colonoscopy    . Ganglion cyst excision      rt foot  . Foot arthroplasty      lt   . Mass excision  11/04/2011    Procedure:  EXCISION MASS;  Surgeon: Cammie Sickle., MD;  Location: [facility];  Service: Orthopedics;  Laterality: Right;  excisional biopsy right ulna mass  . Hemorrhoid surgery      03/1993  . Breast cyst aspiration      9 cysts  . Toe surgery      preventative crossover toe surg/right foot  . Toe surgery      left foot/screw  in 2nd toe  . Upper gastrointestinal endoscopy    . Skin tags removed      breast, panty line, neckline  . Bilateral total mastectomy with axillary lymph node dissection  08/30/2013    Dr Barry Dienes  . Simple mastectomy with axillary sentinel node biopsy Left 08/30/2013    Procedure: Bilateral Breast Mastectomy ;  Surgeon: Stark Klein, MD;  Location: [facility];  Service: General;  Laterality: Left;  Marland Kitchen Mastectomy w/ sentinel node biopsy Right 08/30/2013    Procedure: RIGHT  AXILLARY SENTINEL LYMPH NODE BIOPSY; Right Axillary Node Disection;  Surgeon: Stark Klein, MD;  Location: [facility]  OR;  Service: General;  Laterality: Right;  Right side nuc med 7:00   . Evacuation breast hematoma Left 08/31/2013    Procedure: EVACUATION HEMATOMA BREAST;  Surgeon: Stark Klein, MD;  Location: [place] [facility];  Service: General;  Laterality: Left;    There were no vitals taken for this visit.  Visit Diagnosis:  Tightness in chest  Lymphedema  S/P bilateral mastectomy  Breast cancer of lower-outer quadrant of right female breast      Subjective Assessment - 10/31/14 1437    Symptoms Everything hurts today due to the weather, my fibromyalgia and athritis. Feels softer since starting therapy, I think the tape has really helped. Maybe about 30% improved.            [facility] Adult PT Treatment/Exercise - 10/31/14 0001    Manual Therapy   Myofascial Release Cross hands technique horizontal and vertically at Rt chest   Manual Lymphatic Drainage (MLD) In supine: Short neck, Lt axilla and Rt inguinal nodes, superficial and deep abdominals, anterior inter-axillary and Lt axillo-inguinal  anastomosis, Rt UE from dorsal hand to lateral shoulder          PT Education - 10/31/14 1537    Education provided Yes   Education Details KinesioTape   Person(s) Educated Patient;Spouse   Methods Explanation;Demonstration;Verbal cues   Comprehension Verbalized understanding;Need further instruction    KinesioTape reapplied after treatment (pt removed at beginning of treatment) to Rt trunk with fan towards axilla vertically and another at Rt chest with fan towards axilla horizontally and instructed pts husband in this during.          Plan - 10/31/14 1520    Clinical Impression Statement Reapplied kinesiotape, pts husband had good understanding of this. Tissue is palpably softer but pt still with c/o of sharp pain during myofascial release.   Pt will benefit from skilled therapeutic intervention in order to improve on the following deficits Increased edema;Pain;Increased fascial restricitons   Rehab Potential Excellent   PT Frequency 2x / week   PT Duration 6 weeks   PT Treatment/Interventions Manual techniques;Manual lymph drainage;Patient/family education;Compression bandaging   PT Next Visit Plan Renewal required next visit as pt would like to continue. Review kinesiotaping prn. Continue myofascial release and manual lymph drainage with emphasis on abdominal docngestion. Encourage ROM and exercise. Consider taking another photograph for comparison to inital eval.   PT Home Exercise Plan Pt to get kinesiotape and cont this at home.   Consulted and Agree with Plan of Care Patient                             Short Term Clinic Goals - 10/31/14 1519    CC Short Term Goal  #2   Title reduce  edema by perceived 25% at right dog-ear and inferior to right axilla   Time 6   Period Weeks   Status Achieved          Problem List Patient Active Problem List   Diagnosis Date Noted  . Atypical chest pain 07/15/2014  . Arthralgia 02/22/2014  . Psoriasis  02/22/2014  . Breast cancer of lower-outer quadrant of right female breast, s/p bilateral mastectomy, R ALND 08/2013 08/09/2013  . Neck pain 06/22/2013  . Left knee pain 11/29/2012  . Cluster headache 10/04/2012  . Gastroenteritis 04/26/2012  . Reactive depression (situational) 04/26/2012  . Right wrist pain 02/02/2012  . Right foot pain 10/04/2011  . Tinea capitis 10/04/2011  .  Skin moles 06/05/2011  . Status post total knee replacement 04/06/2011  . Overactive bladder 03/14/2011  . MOLE 12/21/2010  . KNEE PAIN, LEFT 08/31/2010  . HEARING LOSS 08/17/2010  . PSORIASIS 02/26/2010  . HIRSUTISM 06/16/2009  . SHOULDER PAIN, LEFT 11/21/2008  . HELICOBACTER PYLORI INFECTION, HX OF 06/18/2008  . CYST, IDIOPATHIC 05/20/2008  . FOOT PAIN 04/22/2008  . IRRITABLE BOWEL SYNDROME 04/15/2008  . ASTHMA 03/19/2008  . GERD 03/19/2008  . COLONIC POLYPS, HX OF 03/19/2008  . NEPHROLITHIASIS, HX OF 03/19/2008  . Hypothyroidism 03/18/2008  . HYPERLIPIDEMIA 03/18/2008  . INSOMNIA, CHRONIC 03/18/2008  . FIBROMYALGIA 03/18/2008  . Prediabetes 03/18/2008    Otelia Limes, PTA 10/31/2014, 3:37 PM

## 2014-11-05 ENCOUNTER — Ambulatory Visit: Payer: Medicare Other

## 2014-11-05 DIAGNOSIS — C50511 Malignant neoplasm of lower-outer quadrant of right female breast: Secondary | ICD-10-CM

## 2014-11-05 DIAGNOSIS — Z9013 Acquired absence of bilateral breasts and nipples: Secondary | ICD-10-CM

## 2014-11-05 DIAGNOSIS — R0789 Other chest pain: Secondary | ICD-10-CM

## 2014-11-05 DIAGNOSIS — I890 Lymphedema, not elsewhere classified: Secondary | ICD-10-CM

## 2014-11-05 NOTE — Therapy (Signed)
[place], [place], [address] Phone: [phone]   Fax:  [phone]  Physical Therapy Treatment  Patient Details  Name: Jean Nash MRN: [id number] Date of Birth: 02-25-1941  Encounter Date: 11/05/2014      PT End of Session - 11/05/14 1515    Visit Number 13   Number of Visits 16   Date for PT Re-Evaluation 11/29/14   PT Start Time 1440   PT Stop Time 1515   PT Time Calculation (min) 35 min      Past Medical History  Diagnosis Date  . Chest pain, atypical     Negative stress test 02/2009  . Other and unspecified hyperlipidemia   . Personal history of urinary calculi   . Pain in joint, shoulder region   . Irritable bowel syndrome   . Chronic insomnia   . GERD (gastroesophageal reflux disease)   . History of colonic polyps   . Fibromyalgia   . Hypothyroidism   . Other abnormal glucose   . Stress incontinence, female   . Macular degeneration   . Cluster headaches     history of migraines  . Meniscus tear     history of right knee torn meniscus  . Arthritis   . Kidney stone   . Cataract   . Depression   . Pneumonia 6073,7106  . Asthma     hx of -no inhalers, no problems  . H/O hiatal hernia   . Breast cancer 08/08/13    right LOQ  . Hx of radiation therapy 10/29/13- 12/14/13    right chest wall 5040 cGy 28 sessions, right supraclavicular/axillary region 5040 cGy 28 sessions, right chest wall boost 1000 cGy 5 sessions  . Psoriasis     mild    Past Surgical History  Procedure Laterality Date  . Appendectomy    . Cholecystectomy    . Abdominal hysterectomy    . Tonsillectomy    . Eye surgery      to repair macular hole  . Cataract extraction, bilateral    . Knee arthroscopy      > 10 years ago  . Joint replacement  03/15/11    left knee replacement  . Colonoscopy    . Ganglion cyst excision      rt foot  . Foot arthroplasty      lt   . Mass excision  11/04/2011   Procedure: EXCISION MASS;  Surgeon: Cammie Sickle., MD;  Location: [facility];  Service: Orthopedics;  Laterality: Right;  excisional biopsy right ulna mass  . Hemorrhoid surgery      03/1993  . Breast cyst aspiration      9 cysts  . Toe surgery      preventative crossover toe surg/right foot  . Toe surgery      left foot/screw  in 2nd toe  . Upper gastrointestinal endoscopy    . Skin tags removed      breast, panty line, neckline  . Bilateral total mastectomy with axillary lymph node dissection  08/30/2013    Dr Barry Dienes  . Simple mastectomy with axillary sentinel node biopsy Left 08/30/2013    Procedure: Bilateral Breast Mastectomy ;  Surgeon: Stark Klein, MD;  Location: [facility];  Service: General;  Laterality: Left;  Marland Kitchen Mastectomy w/ sentinel node biopsy Right 08/30/2013    Procedure: RIGHT  AXILLARY SENTINEL LYMPH NODE BIOPSY; Right Axillary Node Disection;  Surgeon: Stark Klein, MD;  Location: [facility]  OR;  Service: General;  Laterality: Right;  Right side nuc med 7:00   . Evacuation breast hematoma Left 08/31/2013    Procedure: EVACUATION HEMATOMA BREAST;  Surgeon: Stark Klein, MD;  Location: [facility];  Service: General;  Laterality: Left;    There were no vitals taken for this visit.  Visit Diagnosis:  Tightness in chest  Lymphedema  S/P bilateral mastectomy  Breast cancer of lower-outer quadrant of right female breast      Subjective Assessment - 11/05/14 1443    Symptoms Havent bought the kinesiotape yet, but will get it soon. Last few days was in alot of pain due to fibromyalgia and arthritis. Finally feel better today.          [facility] PT Assessment - 11/05/14 0001    AROM   Right Shoulder Flexion 164 Degrees  Pt supine   Right Shoulder ABduction 120 Degrees  Pt supine           LYMPHEDEMA/ONCOLOGY QUESTIONNAIRE - 11/05/14 1456    Right Upper Extremity Lymphedema   10 cm Proximal to Olecranon Process 36.5 cm   Olecranon Process 26.1 cm   10 cm  Proximal to Ulnar Styloid Process 23.9 cm   Just Proximal to Ulnar Styloid Process 17.4 cm   Across Hand at [employer] 17.5 cm   At [place] of 2nd Digit 5.9 cm   Left Upper Extremity Lymphedema   10 cm Proximal to Olecranon Process 35.8 cm   Olecranon Process 24.6 cm   10 cm Proximal to Ulnar Styloid Process 23.6 cm   Just Proximal to Ulnar Styloid Process 17.2 cm   Across Hand at [employer] 17.5 cm   At [place] of 2nd Digit 5.9 cm               [facility] Adult PT Treatment/Exercise - 11/05/14 0001    Manual Therapy   Myofascial Release To Rt chest wall with cross hands technique horizontal and vertically and UE pulling into flexion and abduction to pts tolerance                   Short Term Clinic Goals - 10/31/14 1519    CC Short Term Goal  #2   Title reduce  edema by perceived 25% at right dog-ear and inferior to right axilla   Time 6   Period Weeks   Status Achieved             Long Term Clinic Goals - 11/05/14 1517    CC Long Term Goal  #1   Title verbalize good undestanding of the manintenance phase of treatment including manyal lymph drainage, se of compression and lymphedema risk reduction practices   Time 4   Period Weeks   Status Achieved   CC Long Term Goal  #2   Title reduce edema by perceived 50% at right dog-ear and inferior to right axilla   Time 4   Period Weeks   Status On-going            Plan - 11/05/14 1516    Clinical Impression Statement Pt doing well with kinesiotape and noticing softening of tissue with this.   Pt will benefit from skilled therapeutic intervention in order to improve on the following deficits Increased edema;Pain;Increased fascial restricitons   Rehab Potential Excellent   PT Frequency 2x / week   PT Duration 6 weeks   PT Treatment/Interventions Manual techniques;Manual lymph drainage;Patient/family education;Compression bandaging   PT Next Visit Plan  Pt to return after holidays and would like to  continue/renew at that time.   PT Home Exercise Plan Pt to get kinesiotape and cont this at home.        Problem List Patient Active Problem List   Diagnosis Date Noted  . Atypical chest pain 07/15/2014  . Arthralgia 02/22/2014  . Psoriasis 02/22/2014  . Breast cancer of lower-outer quadrant of right female breast, s/p bilateral mastectomy, R ALND 08/2013 08/09/2013  . Neck pain 06/22/2013  . Left knee pain 11/29/2012  . Cluster headache 10/04/2012  . Gastroenteritis 04/26/2012  . Reactive depression (situational) 04/26/2012  . Right wrist pain 02/02/2012  . Right foot pain 10/04/2011  . Tinea capitis 10/04/2011  . Skin moles 06/05/2011  . Status post total knee replacement 04/06/2011  . Overactive bladder 03/14/2011  . MOLE 12/21/2010  . KNEE PAIN, LEFT 08/31/2010  . HEARING LOSS 08/17/2010  . PSORIASIS 02/26/2010  . HIRSUTISM 06/16/2009  . SHOULDER PAIN, LEFT 11/21/2008  . HELICOBACTER PYLORI INFECTION, HX OF 06/18/2008  . CYST, IDIOPATHIC 05/20/2008  . FOOT PAIN 04/22/2008  . IRRITABLE BOWEL SYNDROME 04/15/2008  . ASTHMA 03/19/2008  . GERD 03/19/2008  . COLONIC POLYPS, HX OF 03/19/2008  . NEPHROLITHIASIS, HX OF 03/19/2008  . Hypothyroidism 03/18/2008  . HYPERLIPIDEMIA 03/18/2008  . INSOMNIA, CHRONIC 03/18/2008  . FIBROMYALGIA 03/18/2008  . Prediabetes 03/18/2008    Otelia Limes, PTA 11/05/2014, 3:20 PM  [place], [place], [address] Phone: [phone]   Fax:  [phone]

## 2014-11-11 ENCOUNTER — Ambulatory Visit: Payer: Medicare Other | Admitting: Physical Therapy

## 2014-11-11 DIAGNOSIS — Z9013 Acquired absence of bilateral breasts and nipples: Secondary | ICD-10-CM

## 2014-11-11 DIAGNOSIS — C50511 Malignant neoplasm of lower-outer quadrant of right female breast: Secondary | ICD-10-CM | POA: Diagnosis not present

## 2014-11-11 DIAGNOSIS — I890 Lymphedema, not elsewhere classified: Secondary | ICD-10-CM

## 2014-11-11 NOTE — Therapy (Signed)
[place], [place], [address] Phone: [phone]   Fax:  [phone]  Physical Therapy Treatment  Patient Details  Name: Jean Nash MRN: [id number] Date of Birth: 1941/01/05  Encounter Date: 11/11/2014      PT End of Session - 11/11/14 1220    PT Start Time 1033   PT Stop Time 1105   PT Time Calculation (min) 32 min      Past Medical History  Diagnosis Date  . Chest pain, atypical     Negative stress test 02/2009  . Other and unspecified hyperlipidemia   . Personal history of urinary calculi   . Pain in joint, shoulder region   . Irritable bowel syndrome   . Chronic insomnia   . GERD (gastroesophageal reflux disease)   . History of colonic polyps   . Fibromyalgia   . Hypothyroidism   . Other abnormal glucose   . Stress incontinence, female   . Macular degeneration   . Cluster headaches     history of migraines  . Meniscus tear     history of right knee torn meniscus  . Arthritis   . Kidney stone   . Cataract   . Depression   . Pneumonia 8937,3428  . Asthma     hx of -no inhalers, no problems  . H/O hiatal hernia   . Breast cancer 08/08/13    right LOQ  . Hx of radiation therapy 10/29/13- 12/14/13    right chest wall 5040 cGy 28 sessions, right supraclavicular/axillary region 5040 cGy 28 sessions, right chest wall boost 1000 cGy 5 sessions  . Psoriasis     mild    Past Surgical History  Procedure Laterality Date  . Appendectomy    . Cholecystectomy    . Abdominal hysterectomy    . Tonsillectomy    . Eye surgery      to repair macular hole  . Cataract extraction, bilateral    . Knee arthroscopy      > 10 years ago  . Joint replacement  03/15/11    left knee replacement  . Colonoscopy    . Ganglion cyst excision      rt foot  . Foot arthroplasty      lt   . Mass excision  11/04/2011    Procedure: EXCISION MASS;  Surgeon: Cammie Sickle., MD;  Location: [facility];  Service: Orthopedics;  Laterality: Right;  excisional biopsy right ulna mass  . Hemorrhoid surgery      03/1993  . Breast cyst aspiration      9 cysts  . Toe surgery      preventative crossover toe surg/right foot  . Toe surgery      left foot/screw  in 2nd toe  . Upper gastrointestinal endoscopy    . Skin tags removed      breast, panty line, neckline  . Bilateral total mastectomy with axillary lymph node dissection  08/30/2013    Dr Barry Dienes  . Simple mastectomy with axillary sentinel node biopsy Left 08/30/2013    Procedure: Bilateral Breast Mastectomy ;  Surgeon: Stark Klein, MD;  Location: [facility];  Service: General;  Laterality: Left;  Marland Kitchen Mastectomy w/ sentinel node biopsy Right 08/30/2013    Procedure: RIGHT  AXILLARY SENTINEL LYMPH NODE BIOPSY; Right Axillary Node Disection;  Surgeon: Stark Klein, MD;  Location: [facility];  Service: General;  Laterality: Right;  Right side nuc med 7:00   .  Evacuation breast hematoma Left 08/31/2013    Procedure: EVACUATION HEMATOMA BREAST;  Surgeon: Stark Klein, MD;  Location: [facility];  Service: General;  Laterality: Left;    There were no vitals taken for this visit.  Visit Diagnosis:  Lymphedema  S/P bilateral mastectomy      Subjective Assessment - 11/11/14 1208    Symptoms "I was so scared."  Woke up yesterday morning and this was hard as a rock (left chest area near mastectomy incision).  Husband massaged it and it's better, but not gone.  Wants t know if she needs to see the doctor.   Currently in Pain? --  fibromyalgia pains that come and go          [facility] PT Assessment - 11/11/14 0001    Observation/Other Assessments   Observations Observed and palpated patient's left chest at mastectomy incision; area does feel like there is some swelling there.  Pt. reports that it was very hard yesterday but softened when her husband massaged it; it is not hard today.   [facility] Pt. does have a small abrasion at right  lateral chest near mastectomy incision, which she reports is from the [place].  She is using lotion on it and not taping it while the abrasion remains.                  [facility] Adult PT Treatment/Exercise - 11/11/14 0001    Manual Therapy   Manual Lymphatic Drainage (MLD) In supine:  short neck, superficial and deep abdomen; left axilla, left groin, and left axillo-inguinal anastomosis, then focused on left chest area of fullness and directing toward left axillo-inguinal pathway.  Then right groin and right axillo-inguinal pathway and right "dog-ear" directing toward pathway.                   Short Term Clinic Goals - 10/31/14 1519    CC Short Term Goal  #2   Title reduce  edema by perceived 25% at right dog-ear and inferior to right axilla   Time 6   Period Weeks   Status Achieved             Long Term Clinic Goals - 11/05/14 1517    CC Long Term Goal  #1   Title verbalize good undestanding of the manintenance phase of treatment including manyal lymph drainage, se of compression and lymphedema risk reduction practices   Time 4   Period Weeks   Status Achieved   CC Long Term Goal  #2   Title reduce edema by perceived 50% at right dog-ear and inferior to right axilla   Time 4   Period Weeks   Status On-going            Plan - 11/11/14 1214    Clinical Impression Statement I expressed to that patient that although she doesn't recall any precipitating event, that the area on her left chest does appear to be simply swelling that might have arisen from stretching more than usual or something of the like.  The fact that it arose quite suddenly and that it also ameliorated quite a bit with manual lymph drainage performed by her husband supports this as well.  I encouraged patient to see a physician if she remained concerned, or if it worsened again, but that I do feel it is probably swelling.   Pt will benefit from skilled therapeutic intervention in order  to improve on the following deficits Increased edema   Rehab  Potential Excellent        Problem List Patient Active Problem List   Diagnosis Date Noted  . Atypical chest pain 07/15/2014  . Arthralgia 02/22/2014  . Psoriasis 02/22/2014  . Breast cancer of lower-outer quadrant of right female breast, s/p bilateral mastectomy, R ALND 08/2013 08/09/2013  . Neck pain 06/22/2013  . Left knee pain 11/29/2012  . Cluster headache 10/04/2012  . Gastroenteritis 04/26/2012  . Reactive depression (situational) 04/26/2012  . Right wrist pain 02/02/2012  . Right foot pain 10/04/2011  . Tinea capitis 10/04/2011  . Skin moles 06/05/2011  . Status post total knee replacement 04/06/2011  . Overactive bladder 03/14/2011  . MOLE 12/21/2010  . KNEE PAIN, LEFT 08/31/2010  . HEARING LOSS 08/17/2010  . PSORIASIS 02/26/2010  . HIRSUTISM 06/16/2009  . SHOULDER PAIN, LEFT 11/21/2008  . HELICOBACTER PYLORI INFECTION, HX OF 06/18/2008  . CYST, IDIOPATHIC 05/20/2008  . FOOT PAIN 04/22/2008  . IRRITABLE BOWEL SYNDROME 04/15/2008  . ASTHMA 03/19/2008  . GERD 03/19/2008  . COLONIC POLYPS, HX OF 03/19/2008  . NEPHROLITHIASIS, HX OF 03/19/2008  . Hypothyroidism 03/18/2008  . HYPERLIPIDEMIA 03/18/2008  . INSOMNIA, CHRONIC 03/18/2008  . FIBROMYALGIA 03/18/2008  . Prediabetes 03/18/2008    SALISBURY,DONNA 11/11/2014, 12:21 PM  [place], [place], [address] Phone: [phone]   Fax:  [phone]  Serafina Royals, [place]

## 2014-11-19 ENCOUNTER — Ambulatory Visit: Payer: Medicare Other | Attending: General Surgery | Admitting: Physical Therapy

## 2014-11-19 DIAGNOSIS — Z9013 Acquired absence of bilateral breasts and nipples: Secondary | ICD-10-CM | POA: Insufficient documentation

## 2014-11-19 DIAGNOSIS — I972 Postmastectomy lymphedema syndrome: Secondary | ICD-10-CM | POA: Insufficient documentation

## 2014-11-19 DIAGNOSIS — R0789 Other chest pain: Secondary | ICD-10-CM

## 2014-11-19 DIAGNOSIS — C50511 Malignant neoplasm of lower-outer quadrant of right female breast: Secondary | ICD-10-CM | POA: Insufficient documentation

## 2014-11-19 DIAGNOSIS — I890 Lymphedema, not elsewhere classified: Secondary | ICD-10-CM

## 2014-11-19 NOTE — Patient Instructions (Signed)

## 2014-11-19 NOTE — Therapy (Signed)
[place], [place], [address] Phone: [phone]   Fax:  [phone]  Physical Therapy Treatment  Patient Details  Name: Jean Nash MRN: [id number] Date of Birth: Jul 09, 1941  Encounter Date: 11/19/2014      PT End of Session - 11/19/14 1719    Visit Number 15   Number of Visits 16   Date for PT Re-Evaluation 11/29/14   PT Start Time 2671   PT Stop Time 1520   PT Time Calculation (min) 45 min      Past Medical History  Diagnosis Date  . Chest pain, atypical     Negative stress test 02/2009  . Other and unspecified hyperlipidemia   . Personal history of urinary calculi   . Pain in joint, shoulder region   . Irritable bowel syndrome   . Chronic insomnia   . GERD (gastroesophageal reflux disease)   . History of colonic polyps   . Fibromyalgia   . Hypothyroidism   . Other abnormal glucose   . Stress incontinence, female   . Macular degeneration   . Cluster headaches     history of migraines  . Meniscus tear     history of right knee torn meniscus  . Arthritis   . Kidney stone   . Cataract   . Depression   . Pneumonia 2458,0998  . Asthma     hx of -no inhalers, no problems  . H/O hiatal hernia   . Breast cancer 08/08/13    right LOQ  . Hx of radiation therapy 10/29/13- 12/14/13    right chest wall 5040 cGy 28 sessions, right supraclavicular/axillary region 5040 cGy 28 sessions, right chest wall boost 1000 cGy 5 sessions  . Psoriasis     mild    Past Surgical History  Procedure Laterality Date  . Appendectomy    . Cholecystectomy    . Abdominal hysterectomy    . Tonsillectomy    . Eye surgery      to repair macular hole  . Cataract extraction, bilateral    . Knee arthroscopy      > 10 years ago  . Joint replacement  03/15/11    left knee replacement  . Colonoscopy    . Ganglion cyst excision      rt foot  . Foot arthroplasty      lt   . Mass excision  11/04/2011   Procedure: EXCISION MASS;  Surgeon: Cammie Sickle., MD;  Location: [facility];  Service: Orthopedics;  Laterality: Right;  excisional biopsy right ulna mass  . Hemorrhoid surgery      03/1993  . Breast cyst aspiration      9 cysts  . Toe surgery      preventative crossover toe surg/right foot  . Toe surgery      left foot/screw  in 2nd toe  . Upper gastrointestinal endoscopy    . Skin tags removed      breast, panty line, neckline  . Bilateral total mastectomy with axillary lymph node dissection  08/30/2013    Dr Barry Dienes  . Simple mastectomy with axillary sentinel node biopsy Left 08/30/2013    Procedure: Bilateral Breast Mastectomy ;  Surgeon: Stark Klein, MD;  Location: [facility];  Service: General;  Laterality: Left;  Marland Kitchen Mastectomy w/ sentinel node biopsy Right 08/30/2013    Procedure: RIGHT  AXILLARY SENTINEL LYMPH NODE BIOPSY; Right Axillary Node Disection;  Surgeon: Stark Klein, MD;  [facility];  Service: General;  Laterality: Right;  Right side nuc med 7:00   . Evacuation breast hematoma Left 08/31/2013    Procedure: EVACUATION HEMATOMA BREAST;  Surgeon: Stark Klein, MD;  Location: [facility];  Service: General;  Laterality: Left;    There were no vitals taken for this visit.  Visit Diagnosis:  Lymphedema  S/P bilateral mastectomy  Tightness in chest  Breast cancer of lower-outer quadrant of right female breast      Subjective Assessment - 11/19/14 1714    Symptoms "It moved down"  Pt with firm edema just below left mastectomy incision, but not as big as it was according to her   Currently in Pain? Yes   Pain Score --  did not rate   Pain Location Back  and feet                    [facility] Adult PT Treatment/Exercise - 11/19/14 1717    Lumbar Exercises: Supine   Ab Set 5 reps   Glut Set 5 reps   Bent Knee Raise 15 reps;Other (comment)  each leg   Other Supine Lumbar Exercises pelvic tilt   Lumbar Exercises: Sidelying   Clam 5  reps;Other (comment)  each side   Hip Abduction 5 reps  each side       Manual lymph drainage in supine as follows: short neck, right and  left inguinal nodes, superficial and deep abdominals; bilateral anterior chest and lateral trunk and abdomen going toward inguinal nodes.          PT Education - 11/19/14 1718    Education provided Yes   Education Details transverse abdominal exercise   Person(s) Educated Patient   Methods Explanation;Demonstration;Handout   Comprehension Verbalized understanding;Returned demonstration           Short Term Clinic Goals - 10/31/14 1519    CC Short Term Goal  #2   Title reduce  edema by perceived 25% at right dog-ear and inferior to right axilla   Time 6   Period Weeks   Status Achieved             Long Term Clinic Goals - 11/19/14 1732    CC Long Term Goal  #2   Title reduce edema by perceived 50% at right dog-ear and inferior to right axilla   Period Weeks   Status On-going            Plan - 11/19/14 1720    Clinical Impression Statement Ms. Ruberg continues with edem in chest and lateral trunk and possibly abdomen.  She benefitted form core activation exrcises today and said she will continue to do them.  She continues to wear compression t shirts and says she thinks it may be helping some. Emphasis of treatment today was on chest , especially left side.. She felt the area was smaller and softer after treatmetment   PT Next Visit Plan Review core exercise and  perform manual lymph drainage        Problem List Patient Active Problem List   Diagnosis Date Noted  . Atypical chest pain 07/15/2014  . Arthralgia 02/22/2014  . Psoriasis 02/22/2014  . Breast cancer of lower-outer quadrant of right female breast, s/p bilateral mastectomy, R ALND 08/2013 08/09/2013  . Neck pain 06/22/2013  . Left knee pain 11/29/2012  . Cluster headache 10/04/2012  . Gastroenteritis 04/26/2012  . Reactive depression (situational)  04/26/2012  . Right wrist pain 02/02/2012  . Right foot  pain 10/04/2011  . Tinea capitis 10/04/2011  . Skin moles 06/05/2011  . Status post total knee replacement 04/06/2011  . Overactive bladder 03/14/2011  . MOLE 12/21/2010  . KNEE PAIN, LEFT 08/31/2010  . HEARING LOSS 08/17/2010  . PSORIASIS 02/26/2010  . HIRSUTISM 06/16/2009  . SHOULDER PAIN, LEFT 11/21/2008  . HELICOBACTER PYLORI INFECTION, HX OF 06/18/2008  . CYST, IDIOPATHIC 05/20/2008  . FOOT PAIN 04/22/2008  . IRRITABLE BOWEL SYNDROME 04/15/2008  . ASTHMA 03/19/2008  . GERD 03/19/2008  . COLONIC POLYPS, HX OF 03/19/2008  . NEPHROLITHIASIS, HX OF 03/19/2008  . Hypothyroidism 03/18/2008  . HYPERLIPIDEMIA 03/18/2008  . INSOMNIA, CHRONIC 03/18/2008  . FIBROMYALGIA 03/18/2008  . Prediabetes 03/18/2008   Donato Heinz. Owens Shark, PT    11/19/2014, 5:35 PM  [place], [place], [address] Phone: [phone]   Fax:  [phone]

## 2014-11-21 ENCOUNTER — Ambulatory Visit: Payer: Medicare Other

## 2014-11-21 VITALS — BP 110/82

## 2014-11-21 DIAGNOSIS — Z9013 Acquired absence of bilateral breasts and nipples: Secondary | ICD-10-CM | POA: Diagnosis not present

## 2014-11-21 DIAGNOSIS — R0789 Other chest pain: Secondary | ICD-10-CM

## 2014-11-21 DIAGNOSIS — C50511 Malignant neoplasm of lower-outer quadrant of right female breast: Secondary | ICD-10-CM | POA: Diagnosis not present

## 2014-11-21 DIAGNOSIS — I972 Postmastectomy lymphedema syndrome: Secondary | ICD-10-CM | POA: Diagnosis not present

## 2014-11-21 DIAGNOSIS — I890 Lymphedema, not elsewhere classified: Secondary | ICD-10-CM

## 2014-11-21 NOTE — Therapy (Signed)
[place], [place], [address] Phone: [phone]   Fax:  [phone]  Physical Therapy Treatment  Patient Details  Name: Jean Nash MRN: [id number] Date of Birth: 27-May-1941 Referring Provider:  Shawna Orleans, Doe-Hyun Alfonso Patten, DO  Encounter Date: 11/21/2014      PT End of Session - 11/21/14 1518    Visit Number 16   Number of Visits 16   Date for PT Re-Evaluation 11/29/14   PT Start Time 9767   PT Stop Time 1518   PT Time Calculation (min) 46 min      Past Medical History  Diagnosis Date  . Chest pain, atypical     Negative stress test 02/2009  . Other and unspecified hyperlipidemia   . Personal history of urinary calculi   . Pain in joint, shoulder region   . Irritable bowel syndrome   . Chronic insomnia   . GERD (gastroesophageal reflux disease)   . History of colonic polyps   . Fibromyalgia   . Hypothyroidism   . Other abnormal glucose   . Stress incontinence, female   . Macular degeneration   . Cluster headaches     history of migraines  . Meniscus tear     history of right knee torn meniscus  . Arthritis   . Kidney stone   . Cataract   . Depression   . Pneumonia 3419,3790  . Asthma     hx of -no inhalers, no problems  . H/O hiatal hernia   . Breast cancer 08/08/13    right LOQ  . Hx of radiation therapy 10/29/13- 12/14/13    right chest wall 5040 cGy 28 sessions, right supraclavicular/axillary region 5040 cGy 28 sessions, right chest wall boost 1000 cGy 5 sessions  . Psoriasis     mild    Past Surgical History  Procedure Laterality Date  . Appendectomy    . Cholecystectomy    . Abdominal hysterectomy    . Tonsillectomy    . Eye surgery      to repair macular hole  . Cataract extraction, bilateral    . Knee arthroscopy      > 10 years ago  . Joint replacement  03/15/11    left knee replacement  . Colonoscopy    . Ganglion cyst excision      rt foot  . Foot arthroplasty     lt   . Mass excision  11/04/2011    Procedure: EXCISION MASS;  Surgeon: Cammie Sickle., MD;  Location: [facility];  Service: Orthopedics;  Laterality: Right;  excisional biopsy right ulna mass  . Hemorrhoid surgery      03/1993  . Breast cyst aspiration      9 cysts  . Toe surgery      preventative crossover toe surg/right foot  . Toe surgery      left foot/screw  in 2nd toe  . Upper gastrointestinal endoscopy    . Skin tags removed      breast, panty line, neckline  . Bilateral total mastectomy with axillary lymph node dissection  08/30/2013    Dr Barry Dienes  . Simple mastectomy with axillary sentinel node biopsy Left 08/30/2013    Procedure: Bilateral Breast Mastectomy ;  Surgeon: Stark Klein, MD;  Location: [facility];  Service: General;  Laterality: Left;  Marland Kitchen Mastectomy w/ sentinel node biopsy Right 08/30/2013    Procedure: RIGHT  AXILLARY SENTINEL LYMPH NODE BIOPSY; Right Axillary Node Disection;  Surgeon: Stark Klein, MD;  Location: [facility];  Service: General;  Laterality: Right;  Right side nuc med 7:00   . Evacuation breast hematoma Left 08/31/2013    Procedure: EVACUATION HEMATOMA BREAST;  Surgeon: Stark Klein, MD;  Location: [facility];  Service: General;  Laterality: Left;    BP 110/82 mmHg  Visit Diagnosis:  Lymphedema  S/P bilateral mastectomy  Tightness in chest  Breast cancer of lower-outer quadrant of right female breast      Subjective Assessment - 11/21/14 1437    Symptoms Dont feel great today, my BP was a little low earlier today, want you to check it today.                     [facility] Adult PT Treatment/Exercise - 11/21/14 0001    Lumbar Exercises: Supine   Ab Set 5 reps  Posterior pelvic tilt   Clam 5 reps  With pelvic tilt   Heel Slides 5 reps  Bil with pelvic tilt   Bent Knee Raise 5 reps  Bil with pelvic tilt   Manual Therapy   Manual Lymphatic Drainage (MLD) In supine: Short neck, Rt and Lt inguinal nodes, superficial and  deep abdominals, bil anterior chest and lateral trunk and abdomen going toward inguinal nodes.                PT Education - 11/21/14 1517    Education provided Yes   Education Details Reviewed HEP issued last visit.   Person(s) Educated Patient;Spouse   Methods Explanation;Demonstration;Verbal cues   Comprehension Verbalized understanding;Returned demonstration           Short Term Clinic Goals - 10/31/14 1519    CC Short Term Goal  #2   Title reduce  edema by perceived 25% at right dog-ear and inferior to right axilla   Time 6   Period Weeks   Status Achieved             Long Term Clinic Goals - 11/19/14 1732    CC Long Term Goal  #2   Title reduce edema by perceived 50% at right dog-ear and inferior to right axilla   Period Weeks   Status On-going            Plan - 11/21/14 1518    Clinical Impression Statement Pt able to return correct demo with HEP today after verbal cuing. Will benefit from learning the Strength ABC Program.   Pt will benefit from skilled therapeutic intervention in order to improve on the following deficits Increased edema   Rehab Potential Excellent   PT Frequency 2x / week   PT Duration 6 weeks   PT Treatment/Interventions Manual techniques;Manual lymph drainage;Patient/family education;Compression bandaging   PT Next Visit Plan Instruct in Strength ABC Program.        Problem List Patient Active Problem List   Diagnosis Date Noted  . Atypical chest pain 07/15/2014  . Arthralgia 02/22/2014  . Psoriasis 02/22/2014  . Breast cancer of lower-outer quadrant of right female breast, s/p bilateral mastectomy, R ALND 08/2013 08/09/2013  . Neck pain 06/22/2013  . Left knee pain 11/29/2012  . Cluster headache 10/04/2012  . Gastroenteritis 04/26/2012  . Reactive depression (situational) 04/26/2012  . Right wrist pain 02/02/2012  . Right foot pain 10/04/2011  . Tinea capitis 10/04/2011  . Skin moles 06/05/2011  . Status post  total knee replacement 04/06/2011  . Overactive bladder 03/14/2011  . MOLE 12/21/2010  . KNEE PAIN, LEFT  08/31/2010  . HEARING LOSS 08/17/2010  . PSORIASIS 02/26/2010  . HIRSUTISM 06/16/2009  . SHOULDER PAIN, LEFT 11/21/2008  . HELICOBACTER PYLORI INFECTION, HX OF 06/18/2008  . CYST, IDIOPATHIC 05/20/2008  . FOOT PAIN 04/22/2008  . IRRITABLE BOWEL SYNDROME 04/15/2008  . ASTHMA 03/19/2008  . GERD 03/19/2008  . COLONIC POLYPS, HX OF 03/19/2008  . NEPHROLITHIASIS, HX OF 03/19/2008  . Hypothyroidism 03/18/2008  . HYPERLIPIDEMIA 03/18/2008  . INSOMNIA, CHRONIC 03/18/2008  . FIBROMYALGIA 03/18/2008  . Prediabetes 03/18/2008    Collie Siad Ann,PTA 11/21/2014, 3:23 PM  [place], [place], [address] Phone: [phone]   Fax:  [phone]

## 2014-11-26 ENCOUNTER — Ambulatory Visit: Payer: Medicare Other | Admitting: Physical Therapy

## 2014-11-26 DIAGNOSIS — C50511 Malignant neoplasm of lower-outer quadrant of right female breast: Secondary | ICD-10-CM | POA: Diagnosis not present

## 2014-11-26 DIAGNOSIS — I890 Lymphedema, not elsewhere classified: Secondary | ICD-10-CM

## 2014-11-26 DIAGNOSIS — I972 Postmastectomy lymphedema syndrome: Secondary | ICD-10-CM | POA: Diagnosis not present

## 2014-11-26 DIAGNOSIS — Z9013 Acquired absence of bilateral breasts and nipples: Secondary | ICD-10-CM | POA: Diagnosis not present

## 2014-11-26 NOTE — Therapy (Signed)
[place], [place], [address] Phone: [phone]   Fax:  [phone]  Physical Therapy Treatment  Patient Details  Name: Jean Nash MRN: [id number] Date of Birth: Apr 24, 1941 Referring Provider:  Shawna Orleans, Doe-Hyun R, DO  Encounter Date: 11/26/2014      PT End of Session - 11/27/14 0748    Visit Number 17   Number of Visits 16   Date for PT Re-Evaluation 11/29/14   PT Start Time 3818   PT Stop Time 1518   PT Time Calculation (min) 44 min      Past Medical History  Diagnosis Date  . Chest pain, atypical     Negative stress test 02/2009  . Other and unspecified hyperlipidemia   . Personal history of urinary calculi   . Pain in joint, shoulder region   . Irritable bowel syndrome   . Chronic insomnia   . GERD (gastroesophageal reflux disease)   . History of colonic polyps   . Fibromyalgia   . Hypothyroidism   . Other abnormal glucose   . Stress incontinence, female   . Macular degeneration   . Cluster headaches     history of migraines  . Meniscus tear     history of right knee torn meniscus  . Arthritis   . Kidney stone   . Cataract   . Depression   . Pneumonia 2993,7169  . Asthma     hx of -no inhalers, no problems  . H/O hiatal hernia   . Breast cancer 08/08/13    right LOQ  . Hx of radiation therapy 10/29/13- 12/14/13    right chest wall 5040 cGy 28 sessions, right supraclavicular/axillary region 5040 cGy 28 sessions, right chest wall boost 1000 cGy 5 sessions  . Psoriasis     mild    Past Surgical History  Procedure Laterality Date  . Appendectomy    . Cholecystectomy    . Abdominal hysterectomy    . Tonsillectomy    . Eye surgery      to repair macular hole  . Cataract extraction, bilateral    . Knee arthroscopy      > 10 years ago  . Joint replacement  03/15/11    left knee replacement  . Colonoscopy    . Ganglion cyst excision      rt foot  . Foot arthroplasty      lt   . Mass excision  11/04/2011    Procedure: EXCISION MASS;  Surgeon: Cammie Sickle., MD;  Location: [facility];  Service: Orthopedics;  Laterality: Right;  excisional biopsy right ulna mass  . Hemorrhoid surgery      03/1993  . Breast cyst aspiration      9 cysts  . Toe surgery      preventative crossover toe surg/right foot  . Toe surgery      left foot/screw  in 2nd toe  . Upper gastrointestinal endoscopy    . Skin tags removed      breast, panty line, neckline  . Bilateral total mastectomy with axillary lymph node dissection  08/30/2013    Dr Barry Dienes  . Simple mastectomy with axillary sentinel node biopsy Left 08/30/2013    Procedure: Bilateral Breast Mastectomy ;  Surgeon: Stark Klein, MD;  Location: [facility];  Service: General;  Laterality: Left;  Marland Kitchen Mastectomy w/ sentinel node biopsy Right 08/30/2013    Procedure: RIGHT  AXILLARY SENTINEL LYMPH NODE BIOPSY; Right Axillary Node Disection;  Surgeon: Stark Klein, MD;  Location: [facility];  Service: General;  Laterality: Right;  Right side nuc med 7:00   . Evacuation breast hematoma Left 08/31/2013    Procedure: EVACUATION HEMATOMA BREAST;  Surgeon: Stark Klein, MD;  Location: [facility];  Service: General;  Laterality: Left;    There were no vitals taken for this visit.  Visit Diagnosis:  Lymphedema  S/P bilateral mastectomy      Subjective Assessment - 11/26/14 1436    Symptoms "I got a Fitbit!"  Is using a recumbent bike at home, level 3-4 for 5-10 minutes.  Is coming to standing without pushing with hands.  Hasn't been doing the other (abdominal) exercises as much--some difficulty finding time and working through them with her husband.  c/o swelling today--would like that treated.   Currently in Pain? No/denies                    [facility] Adult PT Treatment/Exercise - 11/27/14 0001    Manual Therapy   Manual Lymphatic Drainage (MLD) In supine, short neck, superficial and deep abdomen, right groin and  axillo-inguinal anastomosis plus focus on "dogear" at right chest; left groin and axillo-inguinal anastomosis and focus on "dogear" at left chest.      Also discussed with patient her upcoming trip to [place].  She does have a compression sleeve to wear and has located it; continues to use compression T-shirts as well.  Encouraged exercise.             Short Term Clinic Goals - 10/31/14 1519    CC Short Term Goal  #2   Title reduce  edema by perceived 25% at right dog-ear and inferior to right axilla   Time 6   Period Weeks   Status Achieved             Long Term Clinic Goals - 11/19/14 1732    CC Long Term Goal  #2   Title reduce edema by perceived 50% at right dog-ear and inferior to right axilla   Period Weeks   Status On-going      Pt. reports she has an appointment for plastic surgery in March to reduce dogears.      Plan - 11/27/14 0748    Clinical Impression Statement Pt. now reports doing some endurance exercise with a stationary bike; reports the right dogear had gotten better, but now has returned to its original size, so she may have plastic surgery done for this.   Pt will benefit from skilled therapeutic intervention in order to improve on the following deficits Decreased strength;Increased edema   Rehab Potential Excellent   PT Next Visit Plan Instruct in [place] next for a couple more weeks (until Feb. 9).   PT Home Exercise Plan continue stationary bike and do abdominal strengthening; will learn strength ABC program   Consulted and Agree with Plan of Care Patient   PT Plan strength ABC program and prep for patient to go out of the country for a month in three weeks--be sure she is prepared to continue self-care during that time.        Problem List Patient Active Problem List   Diagnosis Date Noted  . Atypical chest pain 07/15/2014  . Arthralgia 02/22/2014  . Psoriasis 02/22/2014  . Breast cancer of lower-outer quadrant  of right female breast, s/p bilateral mastectomy, R ALND 08/2013 08/09/2013  . Neck pain 06/22/2013  . Left knee pain 11/29/2012  .  Cluster headache 10/04/2012  . Gastroenteritis 04/26/2012  . Reactive depression (situational) 04/26/2012  . Right wrist pain 02/02/2012  . Right foot pain 10/04/2011  . Tinea capitis 10/04/2011  . Skin moles 06/05/2011  . Status post total knee replacement 04/06/2011  . Overactive bladder 03/14/2011  . MOLE 12/21/2010  . KNEE PAIN, LEFT 08/31/2010  . HEARING LOSS 08/17/2010  . PSORIASIS 02/26/2010  . HIRSUTISM 06/16/2009  . SHOULDER PAIN, LEFT 11/21/2008  . HELICOBACTER PYLORI INFECTION, HX OF 06/18/2008  . CYST, IDIOPATHIC 05/20/2008  . FOOT PAIN 04/22/2008  . IRRITABLE BOWEL SYNDROME 04/15/2008  . ASTHMA 03/19/2008  . GERD 03/19/2008  . COLONIC POLYPS, HX OF 03/19/2008  . NEPHROLITHIASIS, HX OF 03/19/2008  . Hypothyroidism 03/18/2008  . HYPERLIPIDEMIA 03/18/2008  . INSOMNIA, CHRONIC 03/18/2008  . FIBROMYALGIA 03/18/2008  . Prediabetes 03/18/2008    Jazlyne Gauger 11/27/2014, 7:52 AM  [place], [place], [address] Phone: [phone]   Fax:  [phone]  Serafina Royals, [place]

## 2014-11-27 DIAGNOSIS — C50511 Malignant neoplasm of lower-outer quadrant of right female breast: Secondary | ICD-10-CM | POA: Diagnosis not present

## 2014-11-27 DIAGNOSIS — C50919 Malignant neoplasm of unspecified site of unspecified female breast: Secondary | ICD-10-CM | POA: Diagnosis not present

## 2014-11-28 ENCOUNTER — Ambulatory Visit: Payer: Medicare Other

## 2014-11-28 DIAGNOSIS — C50511 Malignant neoplasm of lower-outer quadrant of right female breast: Secondary | ICD-10-CM

## 2014-11-28 DIAGNOSIS — Z9013 Acquired absence of bilateral breasts and nipples: Secondary | ICD-10-CM | POA: Diagnosis not present

## 2014-11-28 DIAGNOSIS — I972 Postmastectomy lymphedema syndrome: Secondary | ICD-10-CM | POA: Diagnosis not present

## 2014-11-28 DIAGNOSIS — R0789 Other chest pain: Secondary | ICD-10-CM

## 2014-11-28 DIAGNOSIS — I890 Lymphedema, not elsewhere classified: Secondary | ICD-10-CM

## 2014-11-28 NOTE — Therapy (Signed)
[place], [place], [address] Phone: [phone]   Fax:  [phone]  Physical Therapy Treatment  Patient Details  Name: Jean Nash MRN: [id number] Date of Birth: 01-31-1941 Referring Provider:  Shawna Orleans, Doe-Hyun Alfonso Patten, DO  Encounter Date: 11/28/2014      PT End of Session - 11/28/14 1523    Visit Number 18   Date for PT Re-Evaluation 11/29/14   PT Start Time 6283   PT Stop Time 1520   PT Time Calculation (min) 41 min      Past Medical History  Diagnosis Date  . Chest pain, atypical     Negative stress test 02/2009  . Other and unspecified hyperlipidemia   . Personal history of urinary calculi   . Pain in joint, shoulder region   . Irritable bowel syndrome   . Chronic insomnia   . GERD (gastroesophageal reflux disease)   . History of colonic polyps   . Fibromyalgia   . Hypothyroidism   . Other abnormal glucose   . Stress incontinence, female   . Macular degeneration   . Cluster headaches     history of migraines  . Meniscus tear     history of right knee torn meniscus  . Arthritis   . Kidney stone   . Cataract   . Depression   . Pneumonia 1517,6160  . Asthma     hx of -no inhalers, no problems  . H/O hiatal hernia   . Breast cancer 08/08/13    right LOQ  . Hx of radiation therapy 10/29/13- 12/14/13    right chest wall 5040 cGy 28 sessions, right supraclavicular/axillary region 5040 cGy 28 sessions, right chest wall boost 1000 cGy 5 sessions  . Psoriasis     mild    Past Surgical History  Procedure Laterality Date  . Appendectomy    . Cholecystectomy    . Abdominal hysterectomy    . Tonsillectomy    . Eye surgery      to repair macular hole  . Cataract extraction, bilateral    . Knee arthroscopy      > 10 years ago  . Joint replacement  03/15/11    left knee replacement  . Colonoscopy    . Ganglion cyst excision      rt foot  . Foot arthroplasty      lt   . Mass excision   11/04/2011    Procedure: EXCISION MASS;  Surgeon: Cammie Sickle., MD;  Location: [facility];  Service: Orthopedics;  Laterality: Right;  excisional biopsy right ulna mass  . Hemorrhoid surgery      03/1993  . Breast cyst aspiration      9 cysts  . Toe surgery      preventative crossover toe surg/right foot  . Toe surgery      left foot/screw  in 2nd toe  . Upper gastrointestinal endoscopy    . Skin tags removed      breast, panty line, neckline  . Bilateral total mastectomy with axillary lymph node dissection  08/30/2013    Dr Barry Dienes  . Simple mastectomy with axillary sentinel node biopsy Left 08/30/2013    Procedure: Bilateral Breast Mastectomy ;  Surgeon: Stark Klein, MD;  Location: [facility];  Service: General;  Laterality: Left;  Marland Kitchen Mastectomy w/ sentinel node biopsy Right 08/30/2013    Procedure: RIGHT  AXILLARY SENTINEL LYMPH NODE BIOPSY; Right Axillary Node Disection;  Surgeon: Stark Klein, MD;  [facility];  Service: General;  Laterality: Right;  Right side nuc med 7:00   . Evacuation breast hematoma Left 08/31/2013    Procedure: EVACUATION HEMATOMA BREAST;  Surgeon: Stark Klein, MD;  Location: [facility];  Service: General;  Laterality: Left;    There were no vitals taken for this visit.  Visit Diagnosis:  Lymphedema  S/P bilateral mastectomy  Tightness in chest  Breast cancer of lower-outer quadrant of right female breast      Subjective Assessment - 11/28/14 1444    Symptoms Saw the surgeon yesterday, will have surgery to clean out the scars in March.                     [facility] Adult PT Treatment/Exercise - 11/28/14 0001    Shoulder Exercises: Stretch   Other Shoulder Stretches Stretches from Strength ABC Program: Bil chest, shoulder, tricep, calf, seated hamstring and piriformis fiugre 4 , quadriceps    Other Shoulder Stretches Strength from Strenght ABC Program: Squats, Standing "W", bil hip abduction, scaption against wall, 6" step  ups , tricep kickbacks, calf raises, bicep curls all 5-10 reps each   Manual Therapy   Passive ROM In supine to Rt shoulder into flexion and abduction to pts tolerance                PT Education - 11/28/14 1522    Education provided Yes   Education Details Strength ABC Program   Person(s) Educated Patient;Spouse   Methods Explanation;Demonstration;Handout   Comprehension Verbalized understanding;Returned demonstration;Verbal cues required;Need further instruction           Short Term Clinic Goals - 10/31/14 1519    CC Short Term Goal  #2   Title reduce  edema by perceived 25% at right dog-ear and inferior to right axilla   Time 6   Period Weeks   Status Achieved             Long Term Clinic Goals - 11/19/14 1732    CC Long Term Goal  #2   Title reduce edema by perceived 50% at right dog-ear and inferior to right axilla   Period Weeks   Status On-going            Plan - 11/28/14 1710    Clinical Impression Statement Pt and husband instructed in Strength ABC Program, has a good initial understanding of program though will need some review for technique.   Pt will benefit from skilled therapeutic intervention in order to improve on the following deficits Decreased strength;Increased edema   Rehab Potential Excellent   PT Frequency 2x / week   PT Duration 6 weeks   PT Treatment/Interventions Manual techniques;Manual lymph drainage;Patient/family education;Compression bandaging   PT Next Visit Plan Review Strength ABC Program and renew pt until leaves for trip (Feb 9).        Problem List Patient Active Problem List   Diagnosis Date Noted  . Atypical chest pain 07/15/2014  . Arthralgia 02/22/2014  . Psoriasis 02/22/2014  . Breast cancer of lower-outer quadrant of right female breast, s/p bilateral mastectomy, R ALND 08/2013 08/09/2013  . Neck pain 06/22/2013  . Left knee pain 11/29/2012  . Cluster headache 10/04/2012  . Gastroenteritis 04/26/2012   . Reactive depression (situational) 04/26/2012  . Right wrist pain 02/02/2012  . Right foot pain 10/04/2011  . Tinea capitis 10/04/2011  . Skin moles 06/05/2011  . Status post total knee replacement 04/06/2011  . Overactive bladder 03/14/2011  .  MOLE 12/21/2010  . KNEE PAIN, LEFT 08/31/2010  . HEARING LOSS 08/17/2010  . PSORIASIS 02/26/2010  . HIRSUTISM 06/16/2009  . SHOULDER PAIN, LEFT 11/21/2008  . HELICOBACTER PYLORI INFECTION, HX OF 06/18/2008  . CYST, IDIOPATHIC 05/20/2008  . FOOT PAIN 04/22/2008  . IRRITABLE BOWEL SYNDROME 04/15/2008  . ASTHMA 03/19/2008  . GERD 03/19/2008  . COLONIC POLYPS, HX OF 03/19/2008  . NEPHROLITHIASIS, HX OF 03/19/2008  . Hypothyroidism 03/18/2008  . HYPERLIPIDEMIA 03/18/2008  . INSOMNIA, CHRONIC 03/18/2008  . FIBROMYALGIA 03/18/2008  . Prediabetes 03/18/2008    Otelia Limes, PTA 11/28/2014, 5:13 PM  [place], [place], [address] Phone: [phone]   Fax:  [phone]

## 2014-12-03 ENCOUNTER — Ambulatory Visit: Payer: Medicare Other | Admitting: Physical Therapy

## 2014-12-03 DIAGNOSIS — R0789 Other chest pain: Secondary | ICD-10-CM

## 2014-12-03 DIAGNOSIS — C50511 Malignant neoplasm of lower-outer quadrant of right female breast: Secondary | ICD-10-CM

## 2014-12-03 DIAGNOSIS — Z9013 Acquired absence of bilateral breasts and nipples: Secondary | ICD-10-CM

## 2014-12-03 DIAGNOSIS — I972 Postmastectomy lymphedema syndrome: Secondary | ICD-10-CM | POA: Diagnosis not present

## 2014-12-03 DIAGNOSIS — I890 Lymphedema, not elsewhere classified: Secondary | ICD-10-CM

## 2014-12-03 NOTE — Patient Instructions (Signed)
[email or web address] Courses Online Strength after breast cancer  At the right of the page click on lymphedema exercise session.

## 2014-12-03 NOTE — Therapy (Signed)
[place], [place], [address] Phone: [phone]   Fax:  [phone]  Physical Therapy Treatment  Patient Details  Name: Jean Nash MRN: [id number] Date of Birth: December 27, 1940 Referring Provider:  Shawna Orleans, Doe-Hyun R, DO  Encounter Date: 12/03/2014      PT End of Session - 12/03/14 1740    Visit Number 19   Number of Visits 24   Date for PT Re-Evaluation 01/03/15   PT Start Time 1430   PT Stop Time 1515   PT Time Calculation (min) 45 min      Past Medical History  Diagnosis Date  . Chest pain, atypical     Negative stress test 02/2009  . Other and unspecified hyperlipidemia   . Personal history of urinary calculi   . Pain in joint, shoulder region   . Irritable bowel syndrome   . Chronic insomnia   . GERD (gastroesophageal reflux disease)   . History of colonic polyps   . Fibromyalgia   . Hypothyroidism   . Other abnormal glucose   . Stress incontinence, female   . Macular degeneration   . Cluster headaches     history of migraines  . Meniscus tear     history of right knee torn meniscus  . Arthritis   . Kidney stone   . Cataract   . Depression   . Pneumonia 7591,6384  . Asthma     hx of -no inhalers, no problems  . H/O hiatal hernia   . Breast cancer 08/08/13    right LOQ  . Hx of radiation therapy 10/29/13- 12/14/13    right chest wall 5040 cGy 28 sessions, right supraclavicular/axillary region 5040 cGy 28 sessions, right chest wall boost 1000 cGy 5 sessions  . Psoriasis     mild    Past Surgical History  Procedure Laterality Date  . Appendectomy    . Cholecystectomy    . Abdominal hysterectomy    . Tonsillectomy    . Eye surgery      to repair macular hole  . Cataract extraction, bilateral    . Knee arthroscopy      > 10 years ago  . Joint replacement  03/15/11    left knee replacement  . Colonoscopy    . Ganglion cyst excision      rt foot  . Foot arthroplasty       lt   . Mass excision  11/04/2011    Procedure: EXCISION MASS;  Surgeon: Cammie Sickle., MD;  Location: [facility];  Service: Orthopedics;  Laterality: Right;  excisional biopsy right ulna mass  . Hemorrhoid surgery      03/1993  . Breast cyst aspiration      9 cysts  . Toe surgery      preventative crossover toe surg/right foot  . Toe surgery      left foot/screw  in 2nd toe  . Upper gastrointestinal endoscopy    . Skin tags removed      breast, panty line, neckline  . Bilateral total mastectomy with axillary lymph node dissection  08/30/2013    Dr Barry Dienes  . Simple mastectomy with axillary sentinel node biopsy Left 08/30/2013    Procedure: Bilateral Breast Mastectomy ;  Surgeon: Stark Klein, MD;  Location: [facility];  Service: General;  Laterality: Left;  Marland Kitchen Mastectomy w/ sentinel node biopsy Right 08/30/2013    Procedure: RIGHT  AXILLARY SENTINEL LYMPH NODE BIOPSY; Right Axillary Node  Disection;  Surgeon: Stark Klein, MD;  Location: [facility];  Service: General;  Laterality: Right;  Right side nuc med 7:00   . Evacuation breast hematoma Left 08/31/2013    Procedure: EVACUATION HEMATOMA BREAST;  Surgeon: Stark Klein, MD;  Location: [facility];  Service: General;  Laterality: Left;    There were no vitals taken for this visit.  Visit Diagnosis:  Lymphedema  S/P bilateral mastectomy  Tightness in chest  Breast cancer of lower-outer quadrant of right female breast      Subjective Assessment - 12/03/14 1748    Symptoms pt reports she has some soreness after the exercise session last week.  but she is starting to feel some improvement in her core  strength.  she feels that she has increased swelling in the dog ear areas, especially on right side                    [facility] Adult PT Treatment/Exercise - 12/03/14 1734    Lumbar Exercises: Supine   Bridge 10 reps   Other Supine Lumbar Exercises lower trunk rotation   Other Supine Lumbar Exercises quadruped  opposite arm and leg stretch x 3 reps on each side   Lumbar Exercises: Sidelying   Clam 10 reps  extra cues for abdominal engagement   Knee/Hip Exercises: Stretches   Active Hamstring Stretch 2 reps   Quad Stretch 2 reps   Hip Flexor Stretch 2 reps   Piriformis Stretch 2 reps   Gastroc Stretch 2 reps   Knee/Hip Exercises: Standing   Heel Raises --  limited by heel pain   Functional Squat 10 reps;2 sets  "power up"   Other Standing Knee Exercises standing hip abduction 10 reps on each side   Shoulder Exercises: Supine   Other Supine Exercises chest press 2 sets of 10 with 1 #   Shoulder Exercises: ROM/Strengthening   "W" Arms 2 sets of 10   Other ROM/Strengthening Exercises scaption 2 sets of 10 with 1#   Shoulder Exercises: Stretch   Cross Chest Stretch 2 reps   Wall Stretch - Flexion 2 reps   Wall Stretch - ABduction 2 reps   Other Shoulder Stretches --   Other Shoulder Stretches --                   Short Term Clinic Goals - 10/31/14 1519    CC Short Term Goal  #2   Title reduce  edema by perceived 25% at right dog-ear and inferior to right axilla   Time 6   Period Weeks   Status Achieved             Long Term Clinic Goals - 12/03/14 1754    CC Long Term Goal  #4   Title pt will be independent in slowly progressive strength program to help with lymphedema management and general function   Time 4   Period Weeks   Status New            Plan - 12/03/14 1750    Clinical Impression Statement Pt reports she is starting to do the strengthening exercises at home, but continues to need instructions especially with core activation. She is wearing compression shirts because she thinks they help with underarm swelling at times, and does not help at all at other time.  She doesn't wear the compression shirts at home. Encouraged patient to continue the exercise. She needs more instruction , so have written for a recerticiation today  PT Frequency 2x / week    PT Duration 4 weeks   PT Next Visit Plan Review Strength ABC Program and renew pt until leaves for trip (Feb 9).   PT Plan strength ABC program and prep for patient to go out of the country for a month in three weeks--be sure she is prepared to continue self-care during that time.        Problem List Patient Active Problem List   Diagnosis Date Noted  . Atypical chest pain 07/15/2014  . Arthralgia 02/22/2014  . Psoriasis 02/22/2014  . Breast cancer of lower-outer quadrant of right female breast, s/p bilateral mastectomy, R ALND 08/2013 08/09/2013  . Neck pain 06/22/2013  . Left knee pain 11/29/2012  . Cluster headache 10/04/2012  . Gastroenteritis 04/26/2012  . Reactive depression (situational) 04/26/2012  . Right wrist pain 02/02/2012  . Right foot pain 10/04/2011  . Tinea capitis 10/04/2011  . Skin moles 06/05/2011  . Status post total knee replacement 04/06/2011  . Overactive bladder 03/14/2011  . MOLE 12/21/2010  . KNEE PAIN, LEFT 08/31/2010  . HEARING LOSS 08/17/2010  . PSORIASIS 02/26/2010  . HIRSUTISM 06/16/2009  . SHOULDER PAIN, LEFT 11/21/2008  . HELICOBACTER PYLORI INFECTION, HX OF 06/18/2008  . CYST, IDIOPATHIC 05/20/2008  . FOOT PAIN 04/22/2008  . IRRITABLE BOWEL SYNDROME 04/15/2008  . ASTHMA 03/19/2008  . GERD 03/19/2008  . COLONIC POLYPS, HX OF 03/19/2008  . NEPHROLITHIASIS, HX OF 03/19/2008  . Hypothyroidism 03/18/2008  . HYPERLIPIDEMIA 03/18/2008  . INSOMNIA, CHRONIC 03/18/2008  . FIBROMYALGIA 03/18/2008  . Prediabetes 03/18/2008   Donato Heinz. Owens Shark, PT   12/03/2014, 5:56 PM  [place], [place], [address] Phone: [phone]   Fax:  [phone]

## 2014-12-04 ENCOUNTER — Telehealth: Payer: Self-pay | Admitting: Oncology

## 2014-12-04 NOTE — Telephone Encounter (Signed)
pt has auth referral to [facility] and has been [place]

## 2014-12-05 ENCOUNTER — Ambulatory Visit: Payer: Medicare Other

## 2014-12-05 DIAGNOSIS — C50511 Malignant neoplasm of lower-outer quadrant of right female breast: Secondary | ICD-10-CM | POA: Diagnosis not present

## 2014-12-05 DIAGNOSIS — R0789 Other chest pain: Secondary | ICD-10-CM

## 2014-12-05 DIAGNOSIS — Z9013 Acquired absence of bilateral breasts and nipples: Secondary | ICD-10-CM | POA: Diagnosis not present

## 2014-12-05 DIAGNOSIS — I890 Lymphedema, not elsewhere classified: Secondary | ICD-10-CM

## 2014-12-05 DIAGNOSIS — I972 Postmastectomy lymphedema syndrome: Secondary | ICD-10-CM | POA: Diagnosis not present

## 2014-12-05 NOTE — Therapy (Signed)
[place], [place], [address] Phone: [phone]   Fax:  [phone]  Physical Therapy Treatment  Patient Details  Name: Jean Nash MRN: [id number] Date of Birth: 10-03-1941 Referring Provider:  Shawna Orleans, Doe-Hyun Alfonso Patten, DO  Encounter Date: 12/05/2014      PT End of Session - 12/05/14 1524    Visit Number 20   Number of Visits 24   Date for PT Re-Evaluation 01/03/15   PT Start Time 1442   PT Stop Time 1521   PT Time Calculation (min) 39 min      Past Medical History  Diagnosis Date  . Chest pain, atypical     Negative stress test 02/2009  . Other and unspecified hyperlipidemia   . Personal history of urinary calculi   . Pain in joint, shoulder region   . Irritable bowel syndrome   . Chronic insomnia   . GERD (gastroesophageal reflux disease)   . History of colonic polyps   . Fibromyalgia   . Hypothyroidism   . Other abnormal glucose   . Stress incontinence, female   . Macular degeneration   . Cluster headaches     history of migraines  . Meniscus tear     history of right knee torn meniscus  . Arthritis   . Kidney stone   . Cataract   . Depression   . Pneumonia 4967,5916  . Asthma     hx of -no inhalers, no problems  . H/O hiatal hernia   . Breast cancer 08/08/13    right LOQ  . Hx of radiation therapy 10/29/13- 12/14/13    right chest wall 5040 cGy 28 sessions, right supraclavicular/axillary region 5040 cGy 28 sessions, right chest wall boost 1000 cGy 5 sessions  . Psoriasis     mild    Past Surgical History  Procedure Laterality Date  . Appendectomy    . Cholecystectomy    . Abdominal hysterectomy    . Tonsillectomy    . Eye surgery      to repair macular hole  . Cataract extraction, bilateral    . Knee arthroscopy      > 10 years ago  . Joint replacement  03/15/11    left knee replacement  . Colonoscopy    . Ganglion cyst excision      rt foot  . Foot arthroplasty      lt   . Mass excision  11/04/2011    Procedure: EXCISION MASS;  Surgeon: Cammie Sickle., MD;  Location: [facility];  Service: Orthopedics;  Laterality: Right;  excisional biopsy right ulna mass  . Hemorrhoid surgery      03/1993  . Breast cyst aspiration      9 cysts  . Toe surgery      preventative crossover toe surg/right foot  . Toe surgery      left foot/screw  in 2nd toe  . Upper gastrointestinal endoscopy    . Skin tags removed      breast, panty line, neckline  . Bilateral total mastectomy with axillary lymph node dissection  08/30/2013    Dr Barry Dienes  . Simple mastectomy with axillary sentinel node biopsy Left 08/30/2013    Procedure: Bilateral Breast Mastectomy ;  Surgeon: Stark Klein, MD;  Location: [facility];  Service: General;  Laterality: Left;  Marland Kitchen Mastectomy w/ sentinel node biopsy Right 08/30/2013    Procedure: RIGHT  AXILLARY SENTINEL LYMPH NODE BIOPSY; Right Axillary Node Disection;  Surgeon: Stark Klein, MD;  Location: [facility];  Service: General;  Laterality: Right;  Right side nuc med 7:00   . Evacuation breast hematoma Left 08/31/2013    Procedure: EVACUATION HEMATOMA BREAST;  Surgeon: Stark Klein, MD;  Location: [facility];  Service: General;  Laterality: Left;    There were no vitals taken for this visit.  Visit Diagnosis:  Lymphedema  S/P bilateral mastectomy  Tightness in chest  Breast cancer of lower-outer quadrant of right female breast      Subjective Assessment - 12/05/14 1445    Symptoms The pocket at my Rt axilla has filled up again and Im frustrated about it. Stopped the kinesiotape due to skin irritations.                    [facility] Adult PT Treatment/Exercise - 12/05/14 0001    Manual Therapy   Myofascial Release Cross hands technique at Rt chest wall vertical and horizontal   Manual Lymphatic Drainage (MLD) In supine: Short neck, Rt inguinal nodes, superficial and deep abdominals, Rt axillo-inguinal anastomosis focusinf  near axilla at "dog ear"                    Short Term Clinic Goals - 10/31/14 1519    CC Short Term Goal  #2   Title reduce  edema by perceived 25% at right dog-ear and inferior to right axilla   Time 6   Period Weeks   Status Achieved             Long Term Clinic Goals - 12/03/14 1754    CC Long Term Goal  #4   Title pt will be independent in slowly progressive strength program to help with lymphedema management and general function   Time 4   Period Weeks   Status New            Plan - 12/05/14 1525    Clinical Impression Statement Pt tolerated manual therapy well today and reports "dogear" softer after treatment today.    Pt will benefit from skilled therapeutic intervention in order to improve on the following deficits Decreased strength;Increased edema   Rehab Potential Excellent   PT Frequency 2x / week   PT Duration 4 weeks   PT Treatment/Interventions Manual techniques;Manual lymph drainage;Patient/family education;Compression bandaging   PT Next Visit Plan Review Strength ABC Program and core strength, cont manual therapy.   Consulted and Agree with Plan of Care Patient        Problem List Patient Active Problem List   Diagnosis Date Noted  . Atypical chest pain 07/15/2014  . Arthralgia 02/22/2014  . Psoriasis 02/22/2014  . Breast cancer of lower-outer quadrant of right female breast, s/p bilateral mastectomy, R ALND 08/2013 08/09/2013  . Neck pain 06/22/2013  . Left knee pain 11/29/2012  . Cluster headache 10/04/2012  . Gastroenteritis 04/26/2012  . Reactive depression (situational) 04/26/2012  . Right wrist pain 02/02/2012  . Right foot pain 10/04/2011  . Tinea capitis 10/04/2011  . Skin moles 06/05/2011  . Status post total knee replacement 04/06/2011  . Overactive bladder 03/14/2011  . MOLE 12/21/2010  . KNEE PAIN, LEFT 08/31/2010  . HEARING LOSS 08/17/2010  . PSORIASIS 02/26/2010  . HIRSUTISM 06/16/2009  . SHOULDER PAIN,  LEFT 11/21/2008  . HELICOBACTER PYLORI INFECTION, HX OF 06/18/2008  . CYST, IDIOPATHIC 05/20/2008  . FOOT PAIN 04/22/2008  . IRRITABLE BOWEL SYNDROME 04/15/2008  . ASTHMA 03/19/2008  . GERD 03/19/2008  .  COLONIC POLYPS, HX OF 03/19/2008  . NEPHROLITHIASIS, HX OF 03/19/2008  . Hypothyroidism 03/18/2008  . HYPERLIPIDEMIA 03/18/2008  . INSOMNIA, CHRONIC 03/18/2008  . FIBROMYALGIA 03/18/2008  . Prediabetes 03/18/2008    Collie Siad Ann,PTA 12/05/2014, 3:28 PM  [place], [place], [address] Phone: [phone]   Fax:  [phone]

## 2014-12-10 ENCOUNTER — Ambulatory Visit: Payer: Medicare Other | Admitting: Physical Therapy

## 2014-12-10 DIAGNOSIS — C50511 Malignant neoplasm of lower-outer quadrant of right female breast: Secondary | ICD-10-CM | POA: Diagnosis not present

## 2014-12-10 DIAGNOSIS — I972 Postmastectomy lymphedema syndrome: Secondary | ICD-10-CM | POA: Diagnosis not present

## 2014-12-10 DIAGNOSIS — Z9013 Acquired absence of bilateral breasts and nipples: Secondary | ICD-10-CM | POA: Diagnosis not present

## 2014-12-10 DIAGNOSIS — R0789 Other chest pain: Secondary | ICD-10-CM

## 2014-12-10 DIAGNOSIS — I890 Lymphedema, not elsewhere classified: Secondary | ICD-10-CM

## 2014-12-10 NOTE — Therapy (Signed)
[place], [place], [address] Phone: [phone]   Fax:  [phone]  Physical Therapy Treatment  Patient Details  Name: Jean Nash MRN: [id number] Date of Birth: 19-Feb-1941 Referring Provider:  Shawna Orleans, Doe-Hyun Alfonso Patten, DO  Encounter Date: 12/10/2014      PT End of Session - 12/10/14 1534    Visit Number 21   Number of Visits 24   Date for PT Re-Evaluation 01/03/15   PT Start Time 1435   PT Stop Time 1520   PT Time Calculation (min) 45 min      Past Medical History  Diagnosis Date  . Chest pain, atypical     Negative stress test 02/2009  . Other and unspecified hyperlipidemia   . Personal history of urinary calculi   . Pain in joint, shoulder region   . Irritable bowel syndrome   . Chronic insomnia   . GERD (gastroesophageal reflux disease)   . History of colonic polyps   . Fibromyalgia   . Hypothyroidism   . Other abnormal glucose   . Stress incontinence, female   . Macular degeneration   . Cluster headaches     history of migraines  . Meniscus tear     history of right knee torn meniscus  . Arthritis   . Kidney stone   . Cataract   . Depression   . Pneumonia 3893,7342  . Asthma     hx of -no inhalers, no problems  . H/O hiatal hernia   . Breast cancer 08/08/13    right LOQ  . Hx of radiation therapy 10/29/13- 12/14/13    right chest wall 5040 cGy 28 sessions, right supraclavicular/axillary region 5040 cGy 28 sessions, right chest wall boost 1000 cGy 5 sessions  . Psoriasis     mild    Past Surgical History  Procedure Laterality Date  . Appendectomy    . Cholecystectomy    . Abdominal hysterectomy    . Tonsillectomy    . Eye surgery      to repair macular hole  . Cataract extraction, bilateral    . Knee arthroscopy      > 10 years ago  . Joint replacement  03/15/11    left knee replacement  . Colonoscopy    . Ganglion cyst excision      rt foot  . Foot arthroplasty       lt   . Mass excision  11/04/2011    Procedure: EXCISION MASS;  Surgeon: Cammie Sickle., MD;  Location: [facility];  Service: Orthopedics;  Laterality: Right;  excisional biopsy right ulna mass  . Hemorrhoid surgery      03/1993  . Breast cyst aspiration      9 cysts  . Toe surgery      preventative crossover toe surg/right foot  . Toe surgery      left foot/screw  in 2nd toe  . Upper gastrointestinal endoscopy    . Skin tags removed      breast, panty line, neckline  . Bilateral total mastectomy with axillary lymph node dissection  08/30/2013    Dr Barry Dienes  . Simple mastectomy with axillary sentinel node biopsy Left 08/30/2013    Procedure: Bilateral Breast Mastectomy ;  Surgeon: Stark Klein, MD;  Location: [facility];  Service: General;  Laterality: Left;  Marland Kitchen Mastectomy w/ sentinel node biopsy Right 08/30/2013    Procedure: RIGHT  AXILLARY SENTINEL LYMPH NODE BIOPSY; Right Axillary Node  Disection;  Surgeon: Stark Klein, MD;  Location: [facility];  Service: General;  Laterality: Right;  Right side nuc med 7:00   . Evacuation breast hematoma Left 08/31/2013    Procedure: EVACUATION HEMATOMA BREAST;  Surgeon: Stark Klein, MD;  Location: [facility];  Service: General;  Laterality: Left;    There were no vitals taken for this visit.  Visit Diagnosis:  Lymphedema  S/P bilateral mastectomy  Tightness in chest  Breast cancer of lower-outer quadrant of right female breast      Subjective Assessment - 12/10/14 1530    Symptoms pt reports she is not sleeping well..only about 3 hours per night.  she comes in limping today due to "arthritis" in her right foot. She is hoping to get a shot next weed to help with that.  She says she is doing exercises at home and does think she can do the exercise here today.  she reports she hasn't been doing much since last Thursday and has not worn her compression shirt.  she has not noticed much difference in her chest edema with or without the  compression shirt.,   Currently in Pain? Yes   Pain Score 10-Worst pain ever   Pain Location Foot   Pain Orientation Right   Pain Descriptors / Indicators --  arthritis pain        Manual lymph drainage in supine as follows: short neck, right axillary nodes, left inguinal nodes, superficial and deep abdominals, left axillo-inguinal anastamoses.left chest and lateral chest in supine and right sidelying   Right inguinal nodes, right axillo-inguinal anastamoses, right chest and right lateral chest in left sidelying.          Short Term Clinic Goals - 12/10/14 1538    CC Short Term Goal  #1   Title report overall pain decreased >/=25 percent to otlerate daily tasks with less pain   Time 6   Period Weeks   Status On-going             Long Term Clinic Goals - 12/10/14 1538    CC Long Term Goal  #1   Title verbalize good undestanding of the manintenance phase of treatment including manyal lymph drainage, se of compression and lymphedema risk reduction practices   Time 4   Period Weeks   Status On-going   CC Long Term Goal  #2   Title reduce edema by perceived 50% at right dog-ear and inferior to right axilla   Time 4   Period Weeks   Status On-going   CC Long Term Goal  #3   Title report overall pain decreased >/= 50 percent to tolerate daily tasks with less pain   Time 4   Period Weeks   Status On-going   CC Long Term Goal  #4   Title pt will be independent in slowly progressive strength program to help with lymphedema management and general function   Time 4   Period Weeks   Status On-going            Plan - 12/10/14 1535    Clinical Impression Statement Pt states she is doing exercise at home and is pleased that she can get from sit to stand without using her hands and that she can get from supine to sit without assist. She continues to be limited by problems with sleeping and edema at "dogears"   PT Frequency 2x / week   PT Duration 4 weeks   PT Next  Visit Plan Review  Strength ABC Program and core strength if patient is able and willing , cont manual therapy for symptomatic relief        Problem List Patient Active Problem List   Diagnosis Date Noted  . Atypical chest pain 07/15/2014  . Arthralgia 02/22/2014  . Psoriasis 02/22/2014  . Breast cancer of lower-outer quadrant of right female breast, s/p bilateral mastectomy, R ALND 08/2013 08/09/2013  . Neck pain 06/22/2013  . Left knee pain 11/29/2012  . Cluster headache 10/04/2012  . Gastroenteritis 04/26/2012  . Reactive depression (situational) 04/26/2012  . Right wrist pain 02/02/2012  . Right foot pain 10/04/2011  . Tinea capitis 10/04/2011  . Skin moles 06/05/2011  . Status post total knee replacement 04/06/2011  . Overactive bladder 03/14/2011  . MOLE 12/21/2010  . KNEE PAIN, LEFT 08/31/2010  . HEARING LOSS 08/17/2010  . PSORIASIS 02/26/2010  . HIRSUTISM 06/16/2009  . SHOULDER PAIN, LEFT 11/21/2008  . HELICOBACTER PYLORI INFECTION, HX OF 06/18/2008  . CYST, IDIOPATHIC 05/20/2008  . FOOT PAIN 04/22/2008  . IRRITABLE BOWEL SYNDROME 04/15/2008  . ASTHMA 03/19/2008  . GERD 03/19/2008  . COLONIC POLYPS, HX OF 03/19/2008  . NEPHROLITHIASIS, HX OF 03/19/2008  . Hypothyroidism 03/18/2008  . HYPERLIPIDEMIA 03/18/2008  . INSOMNIA, CHRONIC 03/18/2008  . FIBROMYALGIA 03/18/2008  . Prediabetes 03/18/2008   Donato Heinz. Owens Shark, PT  12/10/2014, 3:40 PM  [place], [place], [address] Phone: [phone]   Fax:  [phone]

## 2014-12-11 ENCOUNTER — Encounter: Payer: Self-pay | Admitting: Internal Medicine

## 2014-12-11 ENCOUNTER — Telehealth: Payer: Self-pay | Admitting: *Deleted

## 2014-12-11 MED ORDER — MELOXICAM 7.5 MG PO TABS: ORAL_TABLET | ORAL | Status: DC

## 2014-12-11 NOTE — Telephone Encounter (Signed)
Spouse called requesting refill for Tamoxifen.  Called him notifying Tamoxifen was sent 12-06-2014 and received at 4:13 pm by [facility].  Thanked me for calling and will call pharmacy.

## 2014-12-12 ENCOUNTER — Other Ambulatory Visit: Payer: Medicare Other

## 2014-12-12 ENCOUNTER — Other Ambulatory Visit: Payer: Self-pay | Admitting: Emergency Medicine

## 2014-12-12 ENCOUNTER — Ambulatory Visit: Payer: Medicare Other | Admitting: Podiatry

## 2014-12-12 ENCOUNTER — Ambulatory Visit: Payer: Medicare Other

## 2014-12-12 DIAGNOSIS — C50511 Malignant neoplasm of lower-outer quadrant of right female breast: Secondary | ICD-10-CM

## 2014-12-12 DIAGNOSIS — R0789 Other chest pain: Secondary | ICD-10-CM

## 2014-12-12 DIAGNOSIS — I972 Postmastectomy lymphedema syndrome: Secondary | ICD-10-CM | POA: Diagnosis not present

## 2014-12-12 DIAGNOSIS — C510 Malignant neoplasm of labium majus: Secondary | ICD-10-CM | POA: Diagnosis not present

## 2014-12-12 DIAGNOSIS — C9101 Acute lymphoblastic leukemia, in remission: Secondary | ICD-10-CM | POA: Diagnosis not present

## 2014-12-12 DIAGNOSIS — Z9013 Acquired absence of bilateral breasts and nipples: Secondary | ICD-10-CM | POA: Diagnosis not present

## 2014-12-12 DIAGNOSIS — I890 Lymphedema, not elsewhere classified: Secondary | ICD-10-CM

## 2014-12-12 NOTE — Therapy (Signed)
[place], [place], [address] Phone: [phone]   Fax:  [phone]  Physical Therapy Treatment  Patient Details  Name: Jean Nash MRN: [id number] Date of Birth: 08/22/1941 Referring Provider:  Shawna Orleans, Doe-Hyun R, DO  Encounter Date: 12/12/2014      PT End of Session - 12/12/14 1349    Visit Number 22   Number of Visits 24   Date for PT Re-Evaluation 01/03/15   PT Start Time 1330   PT Stop Time 1345   PT Time Calculation (min) 15 min      Past Medical History  Diagnosis Date  . Chest pain, atypical     Negative stress test 02/2009  . Other and unspecified hyperlipidemia   . Personal history of urinary calculi   . Pain in joint, shoulder region   . Irritable bowel syndrome   . Chronic insomnia   . GERD (gastroesophageal reflux disease)   . History of colonic polyps   . Fibromyalgia   . Hypothyroidism   . Other abnormal glucose   . Stress incontinence, female   . Macular degeneration   . Cluster headaches     history of migraines  . Meniscus tear     history of right knee torn meniscus  . Arthritis   . Kidney stone   . Cataract   . Depression   . Pneumonia 7342,8768  . Asthma     hx of -no inhalers, no problems  . H/O hiatal hernia   . Breast cancer 08/08/13    right LOQ  . Hx of radiation therapy 10/29/13- 12/14/13    right chest wall 5040 cGy 28 sessions, right supraclavicular/axillary region 5040 cGy 28 sessions, right chest wall boost 1000 cGy 5 sessions  . Psoriasis     mild    Past Surgical History  Procedure Laterality Date  . Appendectomy    . Cholecystectomy    . Abdominal hysterectomy    . Tonsillectomy    . Eye surgery      to repair macular hole  . Cataract extraction, bilateral    . Knee arthroscopy      > 10 years ago  . Joint replacement  03/15/11    left knee replacement  . Colonoscopy    . Ganglion cyst excision      rt foot  . Foot arthroplasty      lt   . Mass excision  11/04/2011    Procedure: EXCISION MASS;  Surgeon: Cammie Sickle., MD;  Location: [facility];  Service: Orthopedics;  Laterality: Right;  excisional biopsy right ulna mass  . Hemorrhoid surgery      03/1993  . Breast cyst aspiration      9 cysts  . Toe surgery      preventative crossover toe surg/right foot  . Toe surgery      left foot/screw  in 2nd toe  . Upper gastrointestinal endoscopy    . Skin tags removed      breast, panty line, neckline  . Bilateral total mastectomy with axillary lymph node dissection  08/30/2013    Dr Barry Dienes  . Simple mastectomy with axillary sentinel node biopsy Left 08/30/2013    Procedure: Bilateral Breast Mastectomy ;  Surgeon: Stark Klein, MD;  Location: [facility];  Service: General;  Laterality: Left;  Marland Kitchen Mastectomy w/ sentinel node biopsy Right 08/30/2013    Procedure: RIGHT  AXILLARY SENTINEL LYMPH NODE BIOPSY; Right Axillary Node Disection;  Surgeon: Stark Klein, MD;  Location: [facility];  Service: General;  Laterality: Right;  Right side nuc med 7:00   . Evacuation breast hematoma Left 08/31/2013    Procedure: EVACUATION HEMATOMA BREAST;  Surgeon: Stark Klein, MD;  Location: [facility];  Service: General;  Laterality: Left;    There were no vitals taken for this visit.  Visit Diagnosis:  Lymphedema  S/P bilateral mastectomy  Tightness in chest  Breast cancer of lower-outer quadrant of right female breast      Subjective Assessment - 12/12/14 1332    Symptoms Feeling really good today other than my foot. No pain and swelling feels fine. Thought my appt was at 1:30.                    [facility] Adult PT Treatment/Exercise - 12/12/14 0001    Manual Therapy   Manual Lymphatic Drainage (MLD) In Supine: Short neck, Rt and Lt inguinal nodes, superficial and deep abdominals, Rt > Lt axillo-inguinal anastomsosis                   Short Term Clinic Goals - 12/10/14 1538    CC Short Term Goal   #1   Title report overall pain decreased >/=25 percent to otlerate daily tasks with less pain   Time 6   Period Weeks   Status On-going             Long Term Clinic Goals - 12/10/14 1538    CC Long Term Goal  #1   Title verbalize good undestanding of the manintenance phase of treatment including manyal lymph drainage, se of compression and lymphedema risk reduction practices   Time 4   Period Weeks   Status On-going   CC Long Term Goal  #2   Title reduce edema by perceived 50% at right dog-ear and inferior to right axilla   Time 4   Period Weeks   Status On-going   CC Long Term Goal  #3   Title report overall pain decreased >/= 50 percent to tolerate daily tasks with less pain   Time 4   Period Weeks   Status On-going   CC Long Term Goal  #4   Title pt will be independent in slowly progressive strength program to help with lymphedema management and general function   Time 4   Period Weeks   Status On-going            Plan - 12/12/14 1349    Clinical Impression Statement Abbreviated treatment today as pt had wrong appt time.    Pt will benefit from skilled therapeutic intervention in order to improve on the following deficits Decreased strength;Increased edema   Rehab Potential Excellent   PT Frequency 2x / week   PT Duration 4 weeks   PT Treatment/Interventions Manual techniques;Manual lymph drainage;Patient/family education;Compression bandaging   PT Next Visit Plan Review Strength ABC Program and core strength if patient is able and willing , cont manual therapy for symptomatic relief        Problem List Patient Active Problem List   Diagnosis Date Noted  . Atypical chest pain 07/15/2014  . Arthralgia 02/22/2014  . Psoriasis 02/22/2014  . Breast cancer of lower-outer quadrant of right female breast, s/p bilateral mastectomy, R ALND 08/2013 08/09/2013  . Neck pain 06/22/2013  . Left knee pain 11/29/2012  . Cluster headache 10/04/2012  . Gastroenteritis  04/26/2012  . Reactive depression (situational) 04/26/2012  . Right wrist pain  02/02/2012  . Right foot pain 10/04/2011  . Tinea capitis 10/04/2011  . Skin moles 06/05/2011  . Status post total knee replacement 04/06/2011  . Overactive bladder 03/14/2011  . MOLE 12/21/2010  . KNEE PAIN, LEFT 08/31/2010  . HEARING LOSS 08/17/2010  . PSORIASIS 02/26/2010  . HIRSUTISM 06/16/2009  . SHOULDER PAIN, LEFT 11/21/2008  . HELICOBACTER PYLORI INFECTION, HX OF 06/18/2008  . CYST, IDIOPATHIC 05/20/2008  . FOOT PAIN 04/22/2008  . IRRITABLE BOWEL SYNDROME 04/15/2008  . ASTHMA 03/19/2008  . GERD 03/19/2008  . COLONIC POLYPS, HX OF 03/19/2008  . NEPHROLITHIASIS, HX OF 03/19/2008  . Hypothyroidism 03/18/2008  . HYPERLIPIDEMIA 03/18/2008  . INSOMNIA, CHRONIC 03/18/2008  . FIBROMYALGIA 03/18/2008  . Prediabetes 03/18/2008    Otelia Limes, PTA 12/12/2014, 1:50 PM  [place], [place], [address] Phone: [phone]   Fax:  [phone]

## 2014-12-13 LAB — CA 125(PREVIOUS METHOD): CA 125: 8.8 U/mL (ref 0.0–30.2)

## 2014-12-13 LAB — CA 125: CA 125: 13 U/mL (ref <35)

## 2014-12-16 ENCOUNTER — Other Ambulatory Visit: Payer: Self-pay | Admitting: *Deleted

## 2014-12-16 DIAGNOSIS — C50511 Malignant neoplasm of lower-outer quadrant of right female breast: Secondary | ICD-10-CM

## 2014-12-16 MED ORDER — TAMOXIFEN CITRATE 20 MG PO TABS: 20.0000 mg | ORAL_TABLET | Freq: Every day | ORAL | Status: DC

## 2014-12-17 DIAGNOSIS — M79671 Pain in right foot: Secondary | ICD-10-CM | POA: Diagnosis not present

## 2014-12-17 DIAGNOSIS — M19071 Primary osteoarthritis, right ankle and foot: Secondary | ICD-10-CM | POA: Diagnosis not present

## 2014-12-19 ENCOUNTER — Ambulatory Visit (HOSPITAL_BASED_OUTPATIENT_CLINIC_OR_DEPARTMENT_OTHER): Payer: Medicare Other | Admitting: Oncology

## 2014-12-19 VITALS — BP 121/56 | HR 72 | Temp 98.2°F | Resp 18 | Ht 59.0 in | Wt 189.7 lb

## 2014-12-19 DIAGNOSIS — Z96659 Presence of unspecified artificial knee joint: Secondary | ICD-10-CM

## 2014-12-19 DIAGNOSIS — M899 Disorder of bone, unspecified: Secondary | ICD-10-CM

## 2014-12-19 DIAGNOSIS — J4530 Mild persistent asthma, uncomplicated: Secondary | ICD-10-CM

## 2014-12-19 DIAGNOSIS — B350 Tinea barbae and tinea capitis: Secondary | ICD-10-CM

## 2014-12-19 DIAGNOSIS — C50511 Malignant neoplasm of lower-outer quadrant of right female breast: Secondary | ICD-10-CM

## 2014-12-19 DIAGNOSIS — L409 Psoriasis, unspecified: Secondary | ICD-10-CM

## 2014-12-19 DIAGNOSIS — M25519 Pain in unspecified shoulder: Secondary | ICD-10-CM

## 2014-12-19 DIAGNOSIS — M25531 Pain in right wrist: Secondary | ICD-10-CM

## 2014-12-19 DIAGNOSIS — F329 Major depressive disorder, single episode, unspecified: Secondary | ICD-10-CM

## 2014-12-19 DIAGNOSIS — D229 Melanocytic nevi, unspecified: Secondary | ICD-10-CM

## 2014-12-19 NOTE — Addendum Note (Signed)
Addended by: Jaci Carrel A on: 12/19/2014 01:03 PM   Modules accepted: Orders, Medications

## 2014-12-19 NOTE — Progress Notes (Signed)
ID: Jean Nash OB: March 14, 1941  MR#: [id number]  EPP#:[id number]  PCP: Jean Pry, DO GYN:  Jean Nash SU: Jean Nash OTHER MD: Jean Nash, Jean Nash, Jean Nash, Jean Nash, Jean Nash  CHIEF COMPLAINT: Estrogen receptor positive breast cancer  CURRENT TREATMENT: Tamoxifen   BREAST CANCER HISTORY: From the original intake note:   Jean Nash had routine screening mammography a08/18/2014 showing no suspicious findings. However she was found to have breast density category C. She researched this, was alarmed by the fact that mammographic sensitivity is significantly decreased when the breasts are dense, and given her family history of breast cancer she opted for proceeding to bilateral breast MRI. This was performed at [place] 08/01/2013. It showed in the right breast an area of nodular and linear enhancement spanning approximately 8.4 cm. There were no abnormal appearing lymph nodes and the left breast was unremarkable.  Biopsy of an area around the middle of the abnormal section of the right breast on 08/08/2013, showed ([id number]) an invasive and in situ ductal carcinoma, the invasive tumor being grade 1, estrogen receptor 90% positive, progesterone receptor 90% positive, with an MIB-105%. HER-2 testing is pending.  The patient's subsequent history is as detailed below   INTERVAL HISTORY: Jean Nash returns today for followup of her breast cancer accompanied by her husband Jean Nash. The interval history is generally stable. She continues on tamoxifen and she continues to have hot flashes." They are driving me bananas". She dresses in layers 2 Jean Nash off more easily but even that doesn't solve the problem. However they don't wake her up at night. She really does not want to take any medication for this at present. They're planning a month-long trip to [place] leaving in 2 days incidentally Jean Nash, who is followed by my partner, Dr. Marin Nash, for P. vera, has had  some unusual chest pains which have been extensively evaluated by cardiology with no pathology found  REVIEW OF SYSTEMS: Jean Nash is worse problem is her insomnia. Again, this is not related to hot flashes. She thinks she sleeps about 3 hours nightly. She hurts "all over", but particularly her lower back. She also hurts in the right axilla. These pain is not more persistent or intense than before. She is easily short of breath secondary to deconditioning, she had a little bit of a cough last week, but she has had no fever, no phlegm, and no real change in any of the symptoms. She has chronic stress urinary incontinence. Aside from these issues once bothering her is her foot and Jean Nash in or so is taking care of that. Her shoulder has not been giving her any trouble. A detailed review of systems today was otherwise noncontributory  PAST MEDICAL HISTORY: Past Medical History  Diagnosis Date  . Chest pain, atypical     Negative stress test 02/2009  . Other and unspecified hyperlipidemia   . Personal history of urinary calculi   . Pain in joint, shoulder region   . Irritable bowel syndrome   . Chronic insomnia   . GERD (gastroesophageal reflux disease)   . History of colonic polyps   . Fibromyalgia   . Hypothyroidism   . Other abnormal glucose   . Stress incontinence, female   . Macular degeneration   . Cluster headaches     history of migraines  . Meniscus tear     history of right knee torn meniscus  . Arthritis   . Kidney stone   . Cataract   . Depression   .  Pneumonia [id number],[id number]  . Asthma     hx of -no inhalers, no problems  . H/O hiatal hernia   . Breast cancer 08/08/13    right LOQ  . Hx of radiation therapy 10/29/13- 12/14/13    right chest wall 5040 cGy 28 sessions, right supraclavicular/axillary region 5040 cGy 28 sessions, right chest wall boost 1000 cGy 5 sessions  . Psoriasis     mild    PAST SURGICAL HISTORY: Past Surgical History  Procedure Laterality Date  .  Appendectomy    . Cholecystectomy    . Abdominal hysterectomy    . Tonsillectomy    . Eye surgery      to repair macular hole  . Cataract extraction, bilateral    . Knee arthroscopy      > 10 years ago  . Joint replacement  03/15/11    left knee replacement  . Colonoscopy    . Ganglion cyst excision      rt foot  . Foot arthroplasty      lt   . Mass excision  11/04/2011    Procedure: EXCISION MASS;  Surgeon: Jean Nash., MD;  Location: [facility];  Service: Orthopedics;  Laterality: Right;  excisional biopsy right ulna mass  . Hemorrhoid surgery      03/1993  . Breast cyst aspiration      9 cysts  . Toe surgery      preventative crossover toe surg/right foot  . Toe surgery      left foot/screw  in 2nd toe  . Upper gastrointestinal endoscopy    . Skin tags removed      breast, panty line, neckline  . Bilateral total mastectomy with axillary lymph node dissection  08/30/2013    Dr Jean Nash  . Simple mastectomy with axillary sentinel node biopsy Left 08/30/2013    Procedure: Bilateral Breast Mastectomy ;  Surgeon: Jean Klein, MD;  Location: [facility];  Service: General;  Laterality: Left;  Marland Kitchen Mastectomy w/ sentinel node biopsy Right 08/30/2013    Procedure: RIGHT  AXILLARY SENTINEL LYMPH NODE BIOPSY; Right Axillary Node Disection;  Surgeon: Jean Klein, MD;  Location: [facility];  Service: General;  Laterality: Right;  Right side nuc med 7:00   . Evacuation breast hematoma Left 08/31/2013    Procedure: EVACUATION HEMATOMA BREAST;  Surgeon: Jean Klein, MD;  [facility];  Service: General;  Laterality: Left;    FAMILY HISTORY Family History  Problem Relation Age of Onset  . Stroke Mother     died age 76  . Diabetes Mother   . Breast cancer Mother 79  . Breast cancer Sister 40  . Breast cancer Paternal Aunt 50  . Diabetes Maternal Grandfather   . Breast cancer Paternal Grandmother 89  . Breast cancer Paternal Aunt     dx in her 44s  . Cancer Maternal  Grandmother     intra-abdominal cancer  . Brain cancer Maternal Uncle 8  . Brain cancer Cousin 59    maternal cousin  . Brain cancer Cousin 20    paternal cousin   the patient's father lived to be 54. The patient's mother was diagnosed with breast cancer at age 60. She died at 54 from unrelated causes. The patient had no brothers, one sister there are multiple second degree relatives with breast cancer, but no history of ovarian cancer in the family.  GYNECOLOGIC HISTORY:  Menarche age 78, first live birth age 78, she is GX P3. She  had a hysterectomy at age 44. She did not take hormone replacement. She took birth control perhaps for 2 years, in the mid-73s. She did not have any complications from that.  SOCIAL HISTORY:  She is a retired Radiation protection practitioner. Her husband Jean Nash used to be a Heritage manager. Daughter Duaa Stelzner is a pediatrician in [place]. Daughter Leamon Arnt is a Engineer, water in [place]. Daughter Evlyn Courier is  a therapist living in [place]. The patient has 12 grandchildren. She attends the local syn[employer]    ADVANCED DIRECTIVES: In place   HEALTH MAINTENANCE: History  Substance Use Topics  . Smoking status: Former Research scientist (life sciences)  . Smokeless tobacco: Never Used  . Alcohol Use: No     Colonoscopy: April 2014 PAP: Status post hysterectomy  Bone density: January 2014 Lipid panel:  No Known Allergies  Current Outpatient Prescriptions  Medication Sig Dispense Refill  . Calcium-Vitamin D-Vitamin K 650-354-65 MG-UNT-MCG CHEW Chew by mouth.    . Cholecalciferol (VITAMIN D) 2000 UNITS CAPS Take 1 capsule by mouth 2 (two) times daily.    . citalopram (CELEXA) 20 MG tablet Take 1 tablet (20 mg total) by mouth daily. 90 tablet 3  . diazepam (VALIUM) 5 MG tablet 1/2 to 1 tablet twice daily as needed 30 tablet 0  . docusate sodium (STOOL SOFTENER) 100 MG capsule Take 100 mg by mouth 3 (three) times daily as needed.     Marland Kitchen esomeprazole (NEXIUM) 40 MG capsule Take 1 capsule  (40 mg total) by mouth daily. 90 capsule 3  . levothyroxine (SYNTHROID, LEVOTHROID) 100 MCG tablet Take 1 tablet (100 mcg total) by mouth daily. 90 tablet 1  . LOVAZA 1 G capsule Take 1 capsule by mouth  twice a day 180 capsule 1  . Lutein-Zeaxanthin 25-5 MG CAPS Take by mouth.    . meloxicam (MOBIC) 7.5 MG tablet Take 1 tablet by mouth 2  times daily. 180 tablet 1  . pregabalin (LYRICA) 75 MG capsule Take 1 capsule (75 mg total) by mouth 2 (two) times daily. 180 capsule 1  . simvastatin (ZOCOR) 10 MG tablet Take 1 tablet (10 mg total) by mouth at bedtime. 90 tablet 3  . tamoxifen (NOLVADEX) 20 MG tablet Take 1 tablet (20 mg total) by mouth daily. 90 tablet 3  . traMADol (ULTRAM) 50 MG tablet Take 1 tablet (50 mg total) by mouth every 6 (six) hours as needed for moderate pain. 360 tablet 4  . UNABLE TO FIND Rx: L8000- Post Surgical Bras (Quantity: 6) K8127- Silicone Breast prosthesis (Quantity: 2) Dx: 174.9; Bilateral mastectomies 1 each 0  . UNABLE TO FIND Rx: N1700 silicone breast prosthesis (qty 2) Athletic Form DX: 174.9 Bilateral Masty 1 each 0  . Wheat Dextrin (BENEFIBER PO) Take by mouth.     No current facility-administered medications for this visit.    OBJECTIVE: Middle-aged white woman who appears stated age 74 Vitals:   12/19/14 1043  BP: 121/56  Pulse: 72  Temp: 98.2 F (36.8 C)  Resp: 18     Body mass index is 38.29 kg/(m^2).    ECOG FS:1 - Symptomatic but completely ambulatory  Sclerae unicteric, pupils round and equal Oropharynx clear, dentition in good repair No cervical or supraclavicular adenopathy Lungs no rales or rhonchi , no wheezes noted Heart regular rate and rhythm Abd soft, obese, nontender, positive bowel sounds MSK no focal spinal tenderness, no right upper extremity lymphedema Neuro: nonfocal, well oriented,  positive affect Breasts: Status post bilateral mastectomies. There is  no evidence of  chest wall  recurrence. Both axillae are  benign.    LAB RESULTS:  CMP     Component Value Date/Time   NA 140 10/09/2014 0844   NA 140 09/16/2014 1212   K 4.4 10/09/2014 0844   K 4.3 09/16/2014 1212   CL 103 10/09/2014 0844   CO2 28 10/09/2014 0844   CO2 25 09/16/2014 1212   GLUCOSE 102* 10/09/2014 0844   GLUCOSE 110 09/16/2014 1212   BUN 14 10/09/2014 0844   BUN 18.0 09/16/2014 1212   CREATININE 0.7 10/09/2014 0844   CREATININE 0.7 09/16/2014 1212   CREATININE 0.70 02/22/2014 1655   CALCIUM 9.0 10/09/2014 0844   CALCIUM 9.1 09/16/2014 1212   PROT 6.5 10/09/2014 0844   PROT 6.2* 09/16/2014 1212   ALBUMIN 4.0 10/09/2014 0844   ALBUMIN 3.5 09/16/2014 1212   AST 18 10/09/2014 0844   AST 13 09/16/2014 1212   ALT 21 10/09/2014 0844   ALT 23 09/16/2014 1212   ALKPHOS 67 10/09/2014 0844   ALKPHOS 63 09/16/2014 1212   BILITOT 0.5 10/09/2014 0844   BILITOT 0.42 09/16/2014 1212   GFRNONAA >90 09/01/2013 0550   GFRAA >90 09/01/2013 0550    I No results found for: SPEP  Lab Results  Component Value Date   WBC 7.1 09/16/2014   NEUTROABS 4.5 09/16/2014   HGB 13.9 09/16/2014   HCT 41.6 09/16/2014   MCV 89.5 09/16/2014   PLT 175 09/16/2014      Chemistry      Component Value Date/Time   NA 140 10/09/2014 0844   NA 140 09/16/2014 1212   K 4.4 10/09/2014 0844   K 4.3 09/16/2014 1212   CL 103 10/09/2014 0844   CO2 28 10/09/2014 0844   CO2 25 09/16/2014 1212   BUN 14 10/09/2014 0844   BUN 18.0 09/16/2014 1212   CREATININE 0.7 10/09/2014 0844   CREATININE 0.7 09/16/2014 1212   CREATININE 0.70 02/22/2014 1655      Component Value Date/Time   CALCIUM 9.0 10/09/2014 0844   CALCIUM 9.1 09/16/2014 1212   ALKPHOS 67 10/09/2014 0844   ALKPHOS 63 09/16/2014 1212   AST 18 10/09/2014 0844   AST 13 09/16/2014 1212   ALT 21 10/09/2014 0844   ALT 23 09/16/2014 1212   BILITOT 0.5 10/09/2014 0844   BILITOT 0.42 09/16/2014 1212     Results for NIMAH, UPHOFF (MRN [id number]) as of 12/19/2014 10:57  Ref. Range  08/08/2008 22:42 12/12/2014 10:04 12/12/2014 10:04  CA 125 Latest Range: <35 U/mL 8.9 13 8.8    No results found for: LABCA2  No components found for: LABCA125  No results for input(s): INR in the last 168 hours.  Urinalysis    Component Value Date/Time   COLORURINE YELLOW 09/05/2013 [place] 09/05/2013 1525   LABSPEC 1.010 09/05/2013 1525   PHURINE 6.0 09/05/2013 1525   GLUCOSEU NEG 09/05/2013 1525   HGBUR NEG 09/05/2013 1525   BILIRUBINUR NEG 09/05/2013 1525   BILIRUBINUR n 07/04/2012 1642   KETONESUR NEG 09/05/2013 1525   PROTEINUR NEG 09/05/2013 1525   PROTEINUR n 07/04/2012 1642   UROBILINOGEN 0.2 09/05/2013 1525   UROBILINOGEN 0.2 07/04/2012 1642   NITRITE POS* 09/05/2013 1525   NITRITE n 07/04/2012 1642   LEUKOCYTESUR MOD* 09/05/2013 1525    STUDIES: No results found.   ASSESSMENT: 74 y.o. BRCA negative [place] woman status post right breast lower outer quadrant biopsy 08/08/2013 for a clinical T2 N0,  stage IIA invasive ductal carcinoma, grade 1, estrogen receptor 90% positive, progesterone receptor 90% positive, with an MIB-1 of 12% and HER-2 negative  (1) status post bilateral mastectomies with right axillary lymph node dissection 08/30/2013 showing a right-sided pT1a pN2, stage IIIA invasive ductal carcinoma, grade 1, with negative margins  (2) given the marginal benefit in terms of mortality risk reduction from adjuvant chemotherapy, the patient decided to forego that option  (3) adjuvant radiation completed 12/14/2013  (4) tamoxifen started February 2015  (5) osteopenia with a T score of -2.2 on bone density January 2014  (6) carries a VUS in [place] [id number]]  PLAN: Kysha is doing fine from a breast cancer point of view, now a little over 2 years out from her definitive surgery. The plan is to continue tamoxifen at least another year and then consider switching to an aromatase inhibitor.  I don't see any problems with her going  to [place] for the next month as planned. She can call me from their there are any issues that I can help with.  I did give her a copy of the "live strong" pamphlet today and encouraged her to start an exercise program. I think that would do more for her than anything else if she could stick with it.  Otherwise she will see me again in 4 months. She knows to call for any problems that may develop before that visit.  Chauncey Cruel, MD   12/19/2014 10:59 AM

## 2014-12-20 ENCOUNTER — Encounter: Payer: Self-pay | Admitting: Oncology

## 2014-12-20 ENCOUNTER — Ambulatory Visit (INDEPENDENT_AMBULATORY_CARE_PROVIDER_SITE_OTHER): Payer: Medicare Other | Admitting: Podiatry

## 2014-12-20 ENCOUNTER — Telehealth: Payer: Self-pay | Admitting: Oncology

## 2014-12-20 ENCOUNTER — Encounter: Payer: Self-pay | Admitting: Podiatry

## 2014-12-20 DIAGNOSIS — M79676 Pain in unspecified toe(s): Secondary | ICD-10-CM

## 2014-12-20 DIAGNOSIS — B351 Tinea unguium: Secondary | ICD-10-CM | POA: Diagnosis not present

## 2014-12-20 NOTE — Telephone Encounter (Signed)
, °

## 2014-12-23 NOTE — Progress Notes (Signed)
[name], female   DOB: [birth year], 74 y.o.   MRN: [id number]  Subjective: 74 year old female returns the office today with complaints of painful, elongated toenails. Denies any recent redness or drainage from the nail sites. No other complaints at this time in no acute changes since last appointment.  Objective: AAO 3, NAD DP/PT pulses palpable, CRT less than 3 seconds Protective sensation intact with Simms Weinstein monofilament, Achilles tendon reflex intact. Nails are hypertrophic, dystrophic, elongated, brittle, discolored 10. There subjective tenderness on palpation of the nails. No surrounding erythema or drainage from the nail sites. No open lesions or pre-ulcer lesions identified. No other areas of tenderness bilateral lower extremities.  No pain with calf compression, swelling, warmth, erythema.  Assessment: 74 year old female with symptomatic onychomycosis  Plan: -Nails sharply debrided 10 without complication/bleeding. -Follow-up in 3 months or sooner should any palms arise.

## 2015-01-24 ENCOUNTER — Other Ambulatory Visit: Payer: BLUE CROSS/BLUE SHIELD

## 2015-01-24 DIAGNOSIS — C50911 Malignant neoplasm of unspecified site of right female breast: Secondary | ICD-10-CM | POA: Diagnosis not present

## 2015-01-27 ENCOUNTER — Other Ambulatory Visit (INDEPENDENT_AMBULATORY_CARE_PROVIDER_SITE_OTHER): Payer: Medicare Other

## 2015-01-27 DIAGNOSIS — R7309 Other abnormal glucose: Secondary | ICD-10-CM

## 2015-01-27 DIAGNOSIS — E785 Hyperlipidemia, unspecified: Secondary | ICD-10-CM

## 2015-01-27 DIAGNOSIS — E038 Other specified hypothyroidism: Secondary | ICD-10-CM | POA: Diagnosis not present

## 2015-01-27 DIAGNOSIS — R7303 Prediabetes: Secondary | ICD-10-CM

## 2015-01-27 LAB — BASIC METABOLIC PANEL
BUN: 19 mg/dL (ref 6–23)
CALCIUM: 9 mg/dL (ref 8.4–10.5)
CO2: 27 mEq/L (ref 19–32)
Chloride: 104 mEq/L (ref 96–112)
Creatinine, Ser: 0.7 mg/dL (ref 0.40–1.20)
GFR: 87.47 mL/min (ref >60.00)
Glucose, Bld: 113 mg/dL — ABNORMAL HIGH (ref 70–99)
POTASSIUM: 4.2 meq/L (ref 3.5–5.1)
Sodium: 138 mEq/L (ref 135–145)

## 2015-01-27 LAB — HEMOGLOBIN A1C: HEMOGLOBIN A1C: 6.2 % (ref 4.6–6.5)

## 2015-01-27 LAB — TSH: TSH: 1.91 u[IU]/mL (ref 0.35–4.50)

## 2015-01-27 NOTE — Addendum Note (Signed)
Addended by: Joyce Gross R on: 01/27/2015 08:40 AM   Modules accepted: Orders

## 2015-02-03 ENCOUNTER — Ambulatory Visit (INDEPENDENT_AMBULATORY_CARE_PROVIDER_SITE_OTHER): Payer: Medicare Other | Admitting: Internal Medicine

## 2015-02-03 ENCOUNTER — Encounter: Payer: Self-pay | Admitting: Internal Medicine

## 2015-02-03 VITALS — BP 114/82 | HR 87 | Temp 98.0°F | Ht 59.0 in | Wt 190.5 lb

## 2015-02-03 DIAGNOSIS — R7309 Other abnormal glucose: Secondary | ICD-10-CM

## 2015-02-03 DIAGNOSIS — G4489 Other headache syndrome: Secondary | ICD-10-CM

## 2015-02-03 DIAGNOSIS — R51 Headache: Secondary | ICD-10-CM | POA: Diagnosis not present

## 2015-02-03 DIAGNOSIS — R0683 Snoring: Secondary | ICD-10-CM

## 2015-02-03 DIAGNOSIS — G4700 Insomnia, unspecified: Secondary | ICD-10-CM | POA: Diagnosis not present

## 2015-02-03 DIAGNOSIS — R7303 Prediabetes: Secondary | ICD-10-CM

## 2015-02-03 DIAGNOSIS — R519 Headache, unspecified: Secondary | ICD-10-CM

## 2015-02-03 DIAGNOSIS — E039 Hypothyroidism, unspecified: Secondary | ICD-10-CM

## 2015-02-03 MED ORDER — LEVOTHYROXINE SODIUM 100 MCG PO TABS: 100.0000 ug | ORAL_TABLET | Freq: Every day | ORAL | Status: DC

## 2015-02-03 MED ORDER — TEMAZEPAM 7.5 MG PO CAPS: 7.5000 mg | ORAL_CAPSULE | Freq: Every evening | ORAL | Status: DC | PRN

## 2015-02-03 MED ORDER — PREGABALIN 75 MG PO CAPS: 75.0000 mg | ORAL_CAPSULE | Freq: Two times a day (BID) | ORAL | Status: DC

## 2015-02-03 NOTE — Assessment & Plan Note (Signed)
Stable.  Continue management with diet and exercise.

## 2015-02-03 NOTE — Assessment & Plan Note (Signed)
74 year old white female with history of stage III breast cancer complains of headaches. Her frequency of headaches increasing. She describes transient sharp sensation lasting 15-20 seconds. Obtain MRI of brain to rule out metastatic disease.

## 2015-02-03 NOTE — Assessment & Plan Note (Signed)
TSH is stable.  Continue same dose of thyroid replacement.

## 2015-02-03 NOTE — Progress Notes (Signed)
Pre visit review using our clinic review tool, if applicable. No additional management support is needed unless otherwise documented below in the visit note. 

## 2015-02-03 NOTE — Assessment & Plan Note (Signed)
Patient complains of poor sleep quality. She feels like her sleep is fragmented. Report of loud snoring. Arrange home sleep study. Use temazepam 7.5 mg daily at bedtime as needed.

## 2015-02-03 NOTE — Progress Notes (Addendum)
Subjective:    [name], female    DOB: 20-Oct-1941, 74 y.o.   MRN: [id number]  HPI  74 year old white female with history of breast cancer, chronic insomnia and prediabetes for follow-up.  Patient complains of poor sleep quality. She feels her sleep is fragmented. She gets up at least once per night to urinate. She has tried numerous sleep aids in the past.  Her husband has noticed loud snoring in the past. She has deferred hospital sleep study in the past.  Hypothyroidism - stable.    Patient complains of intermittent headaches. They have been increasing in frequency. She describes sharp sensation that lasts for 15-20 seconds. She has history of stage III breast cancer. Lab Results  Component Value Date   TSH 1.91 01/27/2015     Review of Systems Fatigue, negative for kicking or leg movements in her sleep    Past Medical History  Diagnosis Date  . Chest pain, atypical     Negative stress test 02/2009  . Other and unspecified hyperlipidemia   . Personal history of urinary calculi   . Pain in joint, shoulder region   . Irritable bowel syndrome   . Chronic insomnia   . GERD (gastroesophageal reflux disease)   . History of colonic polyps   . Fibromyalgia   . Hypothyroidism   . Other abnormal glucose   . Stress incontinence, female   . Macular degeneration   . Cluster headaches     history of migraines  . Meniscus tear     history of right knee torn meniscus  . Arthritis   . Kidney stone   . Cataract   . Depression   . Pneumonia [id number]  . Asthma     hx of -no inhalers, no problems  . H/O hiatal hernia   . Breast cancer 08/08/13    right LOQ  . Hx of radiation therapy 10/29/13- 12/14/13    right chest wall 5040 cGy 28 sessions, right supraclavicular/axillary region 5040 cGy 28 sessions, right chest wall boost 1000 cGy 5 sessions  . Psoriasis     mild    History   Social History  . Marital Status: Married    Spouse Name: N/A  . Number of  Children: 3  . Years of Education: N/A   Occupational History  . Not on file.   Social History Main Topics  . Smoking status: Former Research scientist (life sciences)  . Smokeless tobacco: Never Used  . Alcohol Use: No  . Drug Use: No  . Sexual Activity: Not on file     Comment: menarche age 26, fist live birth 56, P 3, hysterectomy age 65, no HRT, BCP 2 yrs   Other Topics Concern  . 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 Appendectomy    . Cholecystectomy    . Abdominal hysterectomy    . Tonsillectomy    . Eye surgery      to repair macular hole  . Cataract extraction, bilateral    . Knee arthroscopy      > 10 years ago  . Joint replacement  03/15/11    left knee replacement  . Colonoscopy    . Ganglion cyst excision      rt  foot  . Foot arthroplasty      lt   . Mass excision  11/04/2011    Procedure: EXCISION MASS;  Surgeon: Cammie Sickle., MD;  Location: [facility];  Service: Orthopedics;  Laterality: Right;  excisional biopsy right ulna mass  . Hemorrhoid surgery      03/1993  . Breast cyst aspiration      9 cysts  . Toe surgery      preventative crossover toe surg/right foot  . Toe surgery      left foot/screw  in 2nd toe  . Upper gastrointestinal endoscopy    . Skin tags removed      breast, panty line, neckline  . Bilateral total mastectomy with axillary lymph node dissection  08/30/2013    Dr Barry Dienes  . Simple mastectomy with axillary sentinel node biopsy Left 08/30/2013    Procedure: Bilateral Breast Mastectomy ;  Surgeon: Stark Klein, MD;  Location: [facility];  Service: General;  Laterality: Left;  Marland Kitchen Mastectomy w/ sentinel node biopsy Right 08/30/2013    Procedure: RIGHT  AXILLARY SENTINEL LYMPH NODE BIOPSY; Right Axillary Node Disection;  Surgeon: Stark Klein, MD;  Location: [facility];  Service:  General;  Laterality: Right;  Right side nuc med 7:00   . Evacuation breast hematoma Left 08/31/2013    Procedure: EVACUATION HEMATOMA BREAST;  Surgeon: Stark Klein, MD;  [facility];  Service: General;  Laterality: Left;    Family History  Problem Relation Age of Onset  . Stroke Mother     died age 17  . Diabetes Mother   . Breast cancer Mother 92  . Breast cancer Sister 2  . Breast cancer Paternal Aunt 70  . Diabetes Maternal Grandfather   . Breast cancer Paternal Grandmother 67  . Breast cancer Paternal Aunt     dx in her 88s  . Cancer Maternal Grandmother     intra-abdominal cancer  . Brain cancer Maternal Uncle 8  . Brain cancer Cousin 106    maternal cousin  . Brain cancer Cousin 20    paternal cousin    No Known Allergies  Current Outpatient Prescriptions on File Prior to Visit  Medication Sig Dispense Refill  . Calcium-Vitamin D-Vitamin K 500-100-40 MG-UNT-MCG CHEW Chew 1 tablet by mouth 2 (two) times daily.     . Cholecalciferol (VITAMIN D) 2000 UNITS CAPS Take 1 capsule by mouth 2 (two) times daily.    . citalopram (CELEXA) 20 MG tablet Take 1 tablet (20 mg total) by mouth daily. 90 tablet 3  . diazepam (VALIUM) 5 MG tablet 1/2 to 1 tablet twice daily as needed 30 tablet 0  . esomeprazole (NEXIUM) 40 MG capsule Take 1 capsule (40 mg total) by mouth daily. 90 capsule 3  . levothyroxine (SYNTHROID, LEVOTHROID) 100 MCG tablet Take 1 tablet (100 mcg total) by mouth daily. 90 tablet 1  . LOVAZA 1 G capsule Take 1 capsule by mouth  twice a day 180 capsule 1  . Lutein-Zeaxanthin 25-5 MG CAPS Take by mouth.    . meloxicam (MOBIC) 7.5 MG tablet Take 1 tablet by mouth 2  times daily. 180 tablet 1  . pregabalin (LYRICA) 75 MG capsule Take 1 capsule (75 mg total) by mouth 2 (two) times daily. 180 capsule 1  . simvastatin (ZOCOR) 10 MG tablet Take 1 tablet (10 mg total) by mouth at bedtime. 90 tablet 3  . tamoxifen (NOLVADEX) 20 MG tablet Take 1 tablet (20 mg total) by  mouth daily. 90 tablet 3  . Wheat Dextrin (BENEFIBER PO) Take by mouth.     No current facility-administered medications on file prior to visit.    BP 114/82 mmHg  Pulse 87  Temp(Src) 98 F (36.7 C) (Oral)  Ht 4\' 11"  (1.499 m)  Wt 190 lb 8 oz (86.41 kg)  BMI 38.46 kg/m2    Objective:   Physical Exam  Constitutional: She is oriented to person, place, and time. She appears well-developed and well-nourished. No distress.  Cardiovascular: Normal rate, regular rhythm and normal heart sounds.   No murmur heard. Pulmonary/Chest: Effort normal and breath sounds normal. She has no wheezes.  Neurological: She is alert and oriented to person, place, and time. No cranial nerve deficit.  Psychiatric: She has a normal mood and affect. Her behavior is normal.          Assessment & Plan:

## 2015-02-07 ENCOUNTER — Encounter: Payer: Self-pay | Admitting: Internal Medicine

## 2015-02-08 ENCOUNTER — Ambulatory Visit
Admission: RE | Admit: 2015-02-08 | Discharge: 2015-02-08 | Disposition: A | Discharge status: Home / Self Care | Payer: Medicare Other | Source: Ambulatory Visit | Attending: Internal Medicine | Admitting: Internal Medicine

## 2015-02-08 DIAGNOSIS — R51 Headache: Secondary | ICD-10-CM | POA: Diagnosis not present

## 2015-02-08 DIAGNOSIS — G4489 Other headache syndrome: Secondary | ICD-10-CM

## 2015-02-11 MED ORDER — TEMAZEPAM 15 MG PO CAPS: 15.0000 mg | ORAL_CAPSULE | Freq: Every evening | ORAL | Status: DC | PRN

## 2015-02-19 DIAGNOSIS — M1711 Unilateral primary osteoarthritis, right knee: Secondary | ICD-10-CM | POA: Diagnosis not present

## 2015-02-19 DIAGNOSIS — M25562 Pain in left knee: Secondary | ICD-10-CM | POA: Diagnosis not present

## 2015-02-19 DIAGNOSIS — Z96652 Presence of left artificial knee joint: Secondary | ICD-10-CM | POA: Diagnosis not present

## 2015-03-05 ENCOUNTER — Other Ambulatory Visit: Payer: Self-pay | Admitting: Internal Medicine

## 2015-03-05 DIAGNOSIS — R7303 Prediabetes: Secondary | ICD-10-CM

## 2015-03-05 DIAGNOSIS — E785 Hyperlipidemia, unspecified: Secondary | ICD-10-CM

## 2015-03-05 DIAGNOSIS — E039 Hypothyroidism, unspecified: Secondary | ICD-10-CM

## 2015-03-06 DIAGNOSIS — G4730 Sleep apnea, unspecified: Secondary | ICD-10-CM | POA: Diagnosis not present

## 2015-03-20 ENCOUNTER — Ambulatory Visit (INDEPENDENT_AMBULATORY_CARE_PROVIDER_SITE_OTHER): Payer: Medicare Other | Admitting: Podiatry

## 2015-03-20 ENCOUNTER — Encounter: Payer: Self-pay | Admitting: Podiatry

## 2015-03-20 DIAGNOSIS — M79676 Pain in unspecified toe(s): Secondary | ICD-10-CM

## 2015-03-20 DIAGNOSIS — B351 Tinea unguium: Secondary | ICD-10-CM | POA: Diagnosis not present

## 2015-03-20 NOTE — Progress Notes (Signed)
Presents today chief complaint of painful elongated toenails.  Objective: Pulses are palpable bilateral nails are thick, yellow dystrophic onychomycosis and painful palpation.   Assessment: Onychomycosis with pain in limb.  Plan: Treatment of nails in thickness and length as covered service secondary to pain.  

## 2015-03-26 ENCOUNTER — Encounter: Payer: Self-pay | Admitting: Oncology

## 2015-03-31 DIAGNOSIS — G4730 Sleep apnea, unspecified: Secondary | ICD-10-CM | POA: Diagnosis not present

## 2015-04-01 ENCOUNTER — Other Ambulatory Visit: Payer: Self-pay | Admitting: *Deleted

## 2015-04-01 DIAGNOSIS — R0683 Snoring: Secondary | ICD-10-CM

## 2015-04-04 ENCOUNTER — Other Ambulatory Visit: Payer: Self-pay | Admitting: Internal Medicine

## 2015-04-04 DIAGNOSIS — G4733 Obstructive sleep apnea (adult) (pediatric): Secondary | ICD-10-CM

## 2015-04-08 ENCOUNTER — Encounter: Payer: Self-pay | Admitting: Oncology

## 2015-04-09 ENCOUNTER — Other Ambulatory Visit: Payer: Self-pay | Admitting: *Deleted

## 2015-04-09 DIAGNOSIS — C50511 Malignant neoplasm of lower-outer quadrant of right female breast: Secondary | ICD-10-CM

## 2015-04-16 ENCOUNTER — Ambulatory Visit: Payer: Medicare Other | Attending: Oncology | Admitting: Physical Therapy

## 2015-04-16 ENCOUNTER — Encounter: Payer: Self-pay | Admitting: Physical Therapy

## 2015-04-16 DIAGNOSIS — I972 Postmastectomy lymphedema syndrome: Secondary | ICD-10-CM

## 2015-04-16 NOTE — Therapy (Signed)
[place], [place], [address] Phone: [phone]   Fax:  [phone]  Physical Therapy Evaluation  Patient Details  Name: Jean Nash MRN: [id number] Date of Birth: Mar 31, 1941 Referring Provider:  Chauncey Cruel, MD  Encounter Date: 04/16/2015      PT End of Session - 04/16/15 1602    Visit Number 1   Number of Visits 12   Date for PT Re-Evaluation 05/15/15   PT Start Time 1519   PT Stop Time 1601   PT Time Calculation (min) 42 min   Activity Tolerance Patient tolerated treatment well   Behavior During Therapy [facility] for tasks assessed/performed      Past Medical History  Diagnosis Date  . Chest pain, atypical     Negative stress test 02/2009  . Other and unspecified hyperlipidemia   . Personal history of urinary calculi   . Pain in joint, shoulder region   . Irritable bowel syndrome   . Chronic insomnia   . GERD (gastroesophageal reflux disease)   . History of colonic polyps   . Fibromyalgia   . Hypothyroidism   . Other abnormal glucose   . Stress incontinence, female   . Macular degeneration   . Cluster headaches     history of migraines  . Meniscus tear     history of right knee torn meniscus  . Arthritis   . Kidney stone   . Cataract   . Depression   . Pneumonia 3154,0086  . Asthma     hx of -no inhalers, no problems  . H/O hiatal hernia   . Breast cancer 08/08/13    right LOQ  . Hx of radiation therapy 10/29/13- 12/14/13    right chest wall 5040 cGy 28 sessions, right supraclavicular/axillary region 5040 cGy 28 sessions, right chest wall boost 1000 cGy 5 sessions  . Psoriasis     mild    Past Surgical History  Procedure Laterality Date  . Appendectomy    . Cholecystectomy    . Abdominal hysterectomy    . Tonsillectomy    . Eye surgery      to repair macular hole  . Cataract extraction, bilateral    . Knee arthroscopy      > 10 years ago  . Joint replacement   03/15/11    left knee replacement  . Colonoscopy    . Ganglion cyst excision      rt foot  . Foot arthroplasty      lt   . Mass excision  11/04/2011    Procedure: EXCISION MASS;  Surgeon: Cammie Sickle., MD;  Location: [place];  Service: Orthopedics;  Laterality: Right;  excisional biopsy right ulna mass  . Hemorrhoid surgery      03/1993  . Breast cyst aspiration      9 cysts  . Toe surgery      preventative crossover toe surg/right foot  . Toe surgery      left foot/screw  in 2nd toe  . Upper gastrointestinal endoscopy    . Skin tags removed      breast, panty line, neckline  . Bilateral total mastectomy with axillary lymph node dissection  08/30/2013    Dr Barry Dienes  . Simple mastectomy with axillary sentinel node biopsy Left 08/30/2013    Procedure: Bilateral Breast Mastectomy ;  Surgeon: Stark Klein, MD;  Location: [place];  Service: General;  Laterality: Left;  Marland Kitchen Mastectomy w/ sentinel node  biopsy Right 08/30/2013    Procedure: RIGHT  AXILLARY SENTINEL LYMPH NODE BIOPSY; Right Axillary Node Disection;  Surgeon: Stark Klein, MD;  Location: [place];  Service: General;  Laterality: Right;  Right side nuc med 7:00   . Evacuation breast hematoma Left 08/31/2013    Procedure: EVACUATION HEMATOMA BREAST;  Surgeon: Stark Klein, MD;  Location: [place];  Service: General;  Laterality: Left;    There were no vitals filed for this visit.  Visit Diagnosis:  Post-mastectomy lymphedema syndrome - Plan: PT plan of care cert/re-cert      Subjective Assessment - 04/16/15 1524    Subjective Bilateral mastectomy 08/30/2013 and now is experiencing right arm swelling and tight fitting sleeves on that side.   Patient is accompained by: Family member   Pertinent History Right arm swelling began around end of March 2016 for unknown reasons.  She had a bilateral mastectomy with a right axillary node dissection removing 16 lymph nodes and 9 of those were positive.  Currently taking  tamoxifen.    Patient Stated Goals Control lymphedema   Currently in Pain? Yes   Pain Score 10-Worst pain ever  When it occurs, it ranges from 8-10/10   Pain Location Chest   Pain Orientation Right   Pain Descriptors / Indicators Sharp   Pain Type Chronic pain   Pain Onset More than a month ago   Pain Frequency Once a week   Aggravating Factors  unsure   Pain Relieving Factors unsure   Multiple Pain Sites No            [facility] PT Assessment - 04/16/15 0001    Assessment   Medical Diagnosis Right arm lymphedema s/p mastectomy and ALND   Onset Date/Surgical Date 02/12/15   Hand Dominance Right   Next MD Visit 05/01/15   Prior Therapy Yes for same problem   Precautions   Precautions Other (comment)  History of right breast cancer   Balance Screen   Has the patient fallen in the past 6 months No   Has the patient had a decrease in activity level because of a fear of falling?  No   Is the patient reluctant to leave their home because of a fear of falling?  No   Home Environment   Living Environment Private residence   Living Arrangements Spouse/significant other   Available Help at Discharge Family   Prior Function   Level of [place] Retired   Leisure Not exercising   Cognition   Overall Cognitive Status Within Functional Limits for tasks assessed   Posture/Postural Control   Posture/Postural Control Postural limitations   Postural Limitations Rounded Shoulders;Forward head   Palpation   Palpation comment Visible swelling present in right upper arm when holding both arms at 90 degrees abduction           LYMPHEDEMA/ONCOLOGY QUESTIONNAIRE - 04/16/15 1531    Type   Cancer Type History of right breast cancer   Surgeries   Mastectomy Date 08/20/13   Axillary Lymph Node Dissection Date 08/20/13   Number Lymph Nodes Removed 16   Date Lymphedema/Swelling Started   Date 02/12/15   Treatment   Active Chemotherapy Treatment No   Past  Chemotherapy Treatment No   Active Radiation Treatment No   Past Radiation Treatment Yes   Date 12/14/13   Body Site right chest and axilla   Current Hormone Treatment Yes   Date 04/16/15   Drug Name Tamoxifen   Past Hormone  Therapy No   What other symptoms do you have   Are you Having Heaviness or Tightness No   Are you having Pain No   Are you having pitting edema No   Is it Hard or Difficult finding clothes that fit Yes   Do you have infections No   Is there Decreased scar mobility No   Stemmer Sign No   Right Upper Extremity Lymphedema   15 cm Proximal to Olecranon Process 39.7 cm   10 cm Proximal to Olecranon Process 36.3 cm   Olecranon Process 26.8 cm   10 cm Proximal to Ulnar Styloid Process 23.3 cm   Just Proximal to Ulnar Styloid Process 17.3 cm   Across Hand at [employer] 17.8 cm   At [place] of 2nd Digit 6.1 cm   Left Upper Extremity Lymphedema   15 cm Proximal to Olecranon Process 39.5 cm   10 cm Proximal to Olecranon Process 36.5 cm   Olecranon Process 25 cm   10 cm Proximal to Ulnar Styloid Process 23.6 cm   Just Proximal to Ulnar Styloid Process 15.9 cm   Across Hand at [employer] 17.2 cm   At [place] of 2nd Digit 6 cm           Quick Dash - 04/16/15 0001    Open a tight or new jar Unable   Do heavy household chores (wash walls, wash floors) Unable   Carry a shopping bag or briefcase Mild difficulty   Wash your back Unable   Use a knife to cut food No difficulty   Recreational activities in which you take some force or impact through your arm, shoulder, or hand (golf, hammering, tennis) Unable   During the past week, to what extent has your arm, shoulder or hand problem interfered with your normal social activities with family, friends, neighbors, or groups? Not at all   During the past week, to what extent has your arm, shoulder or hand problem limited your work or other regular daily activities Slightly   Arm, shoulder, or hand pain. Moderate    Tingling (pins and needles) in your arm, shoulder, or hand Mild   Difficulty Sleeping No difficulty   DASH Score 47.73 %            PT Education - 04/16/15 1601    Education provided Yes   Education Details Plan for wearing compression sleeve x4 weeks   Person(s) Educated Patient;Spouse   Methods Explanation   Comprehension Verbalized understanding           Short Term Clinic Goals - 04/16/15 1724    CC Short Term Goal  #1   Title Reduce right arm swelling at 10 cm proximal to her right olecranon process by >/= 1 cm   Time 4   Period Weeks   Status New            Plan - 04/16/15 1604    Clinical Impression Statement Patient is a very pleasant 74 year old woman who we have seen several times over the past 2 years to monitor and treat her for lymphedema.  She is also in our lymphedema study so she is closely followed for any changes in her symptoms.  Today she presents with a 3 cm change in her right upper arm compared to baseline measurements taken at time of initial breast cancer diagnosis on 08/15/13.  Her arm has slowly been increasing over the past 18 months and this is in  part due to a weight gain.  We discussed several options from conservative treatment (regular manual lymph drainage) to wearing a compression sleeve daily to try to reduce her swelling and initiating a regular swimming program.  I discussed this at length wtih her and her husband and they decided to try having her wear her compression sleeve and swim regularly to try to lose her weight that she has recently gained.  I will see her back on 05/15/15 for a follow up visit to retake arm measurements.  At that time, if her arm continues to measure the same or has increased, we will initiate manual lymph drainage.   Pt will benefit from skilled therapeutic intervention in order to improve on the following deficits Increased edema   Rehab Potential Excellent   Clinical Impairments Affecting Rehab Potential none    PT Frequency 2x / week   PT Duration 4 weeks  If needed after her follow up on 05/15/15   PT Treatment/Interventions Manual lymph drainage;Patient/family education   PT Next Visit Plan Remeasure both arms.  If right arm swelling persists, begin manual lymph drainage.   Consulted and Agree with Plan of Care Patient;Family member/caregiver   Family Member Consulted Husband   PT Plan See above          G-Codes - 03-May-2015 1726    Functional Assessment Tool Used Clinical Judgement   Functional Limitation Self care   Self Care Current Status [phone]) At least 1 percent but less than 20 percent impaired, limited or restricted   Self Care Goal Status ([id number]) 0 percent impaired, limited or restricted       Problem List Patient Active Problem List   Diagnosis Date Noted  . Headache 02/03/2015  . Atypical chest pain 07/15/2014  . Arthralgia 02/22/2014  . Psoriasis 02/22/2014  . Breast cancer of lower-outer quadrant of right female breast, s/p bilateral mastectomy, R ALND 08/2013 08/09/2013  . Neck pain 06/22/2013  . Left knee pain 11/29/2012  . Cluster headache 10/04/2012  . Gastroenteritis 04/26/2012  . Reactive depression (situational) 04/26/2012  . Right wrist pain 02/02/2012  . Right foot pain 10/04/2011  . Tinea capitis 10/04/2011  . Nevus 06/05/2011  . Status post total knee replacement 04/06/2011  . Overactive bladder 03/14/2011  . MOLE 12/21/2010  . KNEE PAIN, LEFT 08/31/2010  . HEARING LOSS 08/17/2010  . PSORIASIS 02/26/2010  . HIRSUTISM 06/16/2009  . SHOULDER PAIN, LEFT 11/21/2008  . HELICOBACTER PYLORI INFECTION, HX OF 06/18/2008  . CYST, IDIOPATHIC 05/20/2008  . FOOT PAIN 04/22/2008  . IRRITABLE BOWEL SYNDROME 2008-05-02  . Asthma 03/19/2008  . GERD 03/19/2008  . COLONIC POLYPS, HX OF 03/19/2008  . NEPHROLITHIASIS, HX OF 03/19/2008  . Hypothyroidism 03/18/2008  . Hyperlipidemia 03/18/2008  . INSOMNIA, CHRONIC 03/18/2008  . FIBROMYALGIA 03/18/2008  .  Prediabetes 03/18/2008    Annia Friendly, PT 2015/05/03, 5:28 PM  [place], [place], [address] Phone: [phone]   Fax:  [phone]

## 2015-04-16 NOTE — Patient Instructions (Signed)
Looked at circumferential measurements of bilateral arms taken various times over the past 18 months.  Decided with patient and her husband that she agrees to wear her compression sleeve consistently for 4 weeks and begin swimming and then return to see PT and remeasure.  Will begin manual lymph drainage if measurements have not improved.  Patient and husband are in agreement.

## 2015-04-17 ENCOUNTER — Encounter: Payer: Self-pay | Admitting: Physical Therapy

## 2015-04-22 ENCOUNTER — Ambulatory Visit: Payer: Medicare Other | Admitting: Physical Therapy

## 2015-04-22 DIAGNOSIS — M1711 Unilateral primary osteoarthritis, right knee: Secondary | ICD-10-CM | POA: Diagnosis not present

## 2015-04-23 ENCOUNTER — Other Ambulatory Visit (HOSPITAL_BASED_OUTPATIENT_CLINIC_OR_DEPARTMENT_OTHER): Payer: Medicare Other

## 2015-04-23 DIAGNOSIS — C50511 Malignant neoplasm of lower-outer quadrant of right female breast: Secondary | ICD-10-CM

## 2015-04-23 DIAGNOSIS — M25531 Pain in right wrist: Secondary | ICD-10-CM

## 2015-04-23 DIAGNOSIS — B350 Tinea barbae and tinea capitis: Secondary | ICD-10-CM

## 2015-04-23 LAB — CBC WITH DIFFERENTIAL/PLATELET
BASO%: 0.6 % (ref 0.0–2.0)
Basophils Absolute: 0 10*3/uL (ref 0.0–0.1)
EOS%: 3 % (ref 0.0–7.0)
Eosinophils Absolute: 0.2 10*3/uL (ref 0.0–0.5)
HCT: 42.8 % (ref 34.8–46.6)
HGB: 14 g/dL (ref 11.6–15.9)
LYMPH%: 20.4 % (ref 14.0–49.7)
MCH: 29.3 pg (ref 25.1–34.0)
MCHC: 32.7 g/dL (ref 31.5–36.0)
MCV: 89.6 fL (ref 79.5–101.0)
MONO#: 0.5 10*3/uL (ref 0.1–0.9)
MONO%: 7.1 % (ref 0.0–14.0)
NEUT#: 4.7 10*3/uL (ref 1.5–6.5)
NEUT%: 68.9 % (ref 38.4–76.8)
PLATELETS: 182 10*3/uL (ref 145–400)
RBC: 4.78 10*6/uL (ref 3.70–5.45)
RDW: 13.7 % (ref 11.2–14.5)
WBC: 6.9 10*3/uL (ref 3.9–10.3)
lymph#: 1.4 10*3/uL (ref 0.9–3.3)

## 2015-04-23 LAB — COMPREHENSIVE METABOLIC PANEL (CC13)
ALBUMIN: 3.3 g/dL — AB (ref 3.5–5.0)
ALT: 26 U/L (ref 0–55)
AST: 20 U/L (ref 5–34)
Alkaline Phosphatase: 57 U/L (ref 40–150)
Anion Gap: 9 mEq/L (ref 3–11)
BUN: 12.5 mg/dL (ref 7.0–26.0)
CALCIUM: 8.6 mg/dL (ref 8.4–10.4)
CO2: 25 mEq/L (ref 22–29)
Chloride: 105 mEq/L (ref 98–109)
Creatinine: 0.8 mg/dL (ref 0.6–1.1)
EGFR: 77 mL/min/{1.73_m2} — ABNORMAL LOW (ref >90)
GLUCOSE: 152 mg/dL — AB (ref 70–140)
Potassium: 4.3 mEq/L (ref 3.5–5.1)
SODIUM: 140 meq/L (ref 136–145)
TOTAL PROTEIN: 6.1 g/dL — AB (ref 6.4–8.3)
Total Bilirubin: 0.52 mg/dL (ref 0.20–1.20)

## 2015-04-29 DIAGNOSIS — M1711 Unilateral primary osteoarthritis, right knee: Secondary | ICD-10-CM | POA: Diagnosis not present

## 2015-04-30 ENCOUNTER — Ambulatory Visit: Payer: BLUE CROSS/BLUE SHIELD | Admitting: Oncology

## 2015-04-30 ENCOUNTER — Other Ambulatory Visit (INDEPENDENT_AMBULATORY_CARE_PROVIDER_SITE_OTHER): Payer: Medicare Other

## 2015-04-30 ENCOUNTER — Telehealth: Payer: Self-pay | Admitting: Internal Medicine

## 2015-04-30 DIAGNOSIS — E039 Hypothyroidism, unspecified: Secondary | ICD-10-CM

## 2015-04-30 DIAGNOSIS — R7303 Prediabetes: Secondary | ICD-10-CM

## 2015-04-30 DIAGNOSIS — E785 Hyperlipidemia, unspecified: Secondary | ICD-10-CM | POA: Diagnosis not present

## 2015-04-30 DIAGNOSIS — R7309 Other abnormal glucose: Secondary | ICD-10-CM

## 2015-04-30 LAB — HEPATIC FUNCTION PANEL
ALBUMIN: 3.9 g/dL (ref 3.5–5.2)
ALT: 28 U/L (ref 0–35)
AST: 24 U/L (ref 0–37)
Alkaline Phosphatase: 48 U/L (ref 39–117)
BILIRUBIN TOTAL: 0.6 mg/dL (ref 0.2–1.2)
Bilirubin, Direct: 0.1 mg/dL (ref 0.0–0.3)
Total Protein: 6.2 g/dL (ref 6.0–8.3)

## 2015-04-30 LAB — BASIC METABOLIC PANEL
BUN: 14 mg/dL (ref 6–23)
CALCIUM: 8.9 mg/dL (ref 8.4–10.5)
CO2: 28 mEq/L (ref 19–32)
Chloride: 104 mEq/L (ref 96–112)
Creatinine, Ser: 0.65 mg/dL (ref 0.40–1.20)
GFR: 95.22 mL/min (ref >60.00)
GLUCOSE: 113 mg/dL — AB (ref 70–99)
Potassium: 4 mEq/L (ref 3.5–5.1)
Sodium: 138 mEq/L (ref 135–145)

## 2015-04-30 LAB — TSH: TSH: 1.51 u[IU]/mL (ref 0.35–4.50)

## 2015-04-30 LAB — LIPID PANEL
Cholesterol: 200 mg/dL (ref 0–200)
HDL: 42.9 mg/dL (ref >39.00)
LDL Cholesterol: 131 mg/dL — ABNORMAL HIGH (ref 0–99)
NonHDL: 157.1
TRIGLYCERIDES: 129 mg/dL (ref 0.0–149.0)
Total CHOL/HDL Ratio: 5
VLDL: 25.8 mg/dL (ref 0.0–40.0)

## 2015-04-30 LAB — HEMOGLOBIN A1C: HEMOGLOBIN A1C: 6.1 % (ref 4.6–6.5)

## 2015-04-30 NOTE — Telephone Encounter (Signed)
Patient has appointment scheduled with Dr. Shawna Orleans on the 05/07/2015. Patient had labs drawn today and would prefer to see Dr. Shawna Orleans. Labs are only good for 30 days. Patient is willing to have the labs drawn again around the time of her next appointment if she has to reschedule if Dr. Shawna Orleans will be available.

## 2015-04-30 NOTE — Telephone Encounter (Signed)
Patient that if she has to see another doctor that discuss the sleep test as well

## 2015-05-01 ENCOUNTER — Telehealth: Payer: Self-pay | Admitting: Oncology

## 2015-05-01 ENCOUNTER — Ambulatory Visit (HOSPITAL_BASED_OUTPATIENT_CLINIC_OR_DEPARTMENT_OTHER): Payer: Medicare Other | Admitting: Oncology

## 2015-05-01 ENCOUNTER — Ambulatory Visit (HOSPITAL_COMMUNITY)
Admission: RE | Admit: 2015-05-01 | Discharge: 2015-05-01 | Disposition: A | Discharge status: Home / Self Care | Payer: Medicare Other | Source: Ambulatory Visit | Attending: Oncology | Admitting: Oncology

## 2015-05-01 VITALS — BP 133/56 | HR 85 | Temp 98.0°F | Resp 18 | Ht 59.0 in | Wt 196.4 lb

## 2015-05-01 DIAGNOSIS — M8580 Other specified disorders of bone density and structure, unspecified site: Secondary | ICD-10-CM | POA: Diagnosis not present

## 2015-05-01 DIAGNOSIS — Z170 Estrogen receptor positive status [ER+]: Secondary | ICD-10-CM

## 2015-05-01 DIAGNOSIS — C50911 Malignant neoplasm of unspecified site of right female breast: Secondary | ICD-10-CM | POA: Diagnosis not present

## 2015-05-01 DIAGNOSIS — R079 Chest pain, unspecified: Secondary | ICD-10-CM | POA: Diagnosis not present

## 2015-05-01 DIAGNOSIS — C50511 Malignant neoplasm of lower-outer quadrant of right female breast: Secondary | ICD-10-CM

## 2015-05-01 MED ORDER — TAMOXIFEN CITRATE 20 MG PO TABS: 20.0000 mg | ORAL_TABLET | Freq: Every day | ORAL | Status: DC
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
## 2015-05-01 NOTE — Telephone Encounter (Signed)
Gave avs & calendar for April 2017. °

## 2015-05-06 NOTE — Telephone Encounter (Signed)
Patient has appointment with MD Burchette on 6/22

## 2015-05-07 ENCOUNTER — Encounter: Payer: Self-pay | Admitting: Family Medicine

## 2015-05-07 ENCOUNTER — Ambulatory Visit: Payer: Medicare Other | Admitting: Internal Medicine

## 2015-05-07 ENCOUNTER — Ambulatory Visit (INDEPENDENT_AMBULATORY_CARE_PROVIDER_SITE_OTHER): Payer: Medicare Other | Admitting: Family Medicine

## 2015-05-07 VITALS — BP 120/80 | HR 68 | Temp 98.0°F | Wt 196.0 lb

## 2015-05-07 DIAGNOSIS — E785 Hyperlipidemia, unspecified: Secondary | ICD-10-CM | POA: Diagnosis not present

## 2015-05-07 DIAGNOSIS — E038 Other specified hypothyroidism: Secondary | ICD-10-CM

## 2015-05-07 DIAGNOSIS — R5383 Other fatigue: Secondary | ICD-10-CM

## 2015-05-07 DIAGNOSIS — R7303 Prediabetes: Secondary | ICD-10-CM

## 2015-05-07 DIAGNOSIS — R7309 Other abnormal glucose: Secondary | ICD-10-CM

## 2015-05-07 DIAGNOSIS — D239 Other benign neoplasm of skin, unspecified: Secondary | ICD-10-CM

## 2015-05-07 DIAGNOSIS — L659 Nonscarring hair loss, unspecified: Secondary | ICD-10-CM | POA: Diagnosis not present

## 2015-05-07 NOTE — Progress Notes (Signed)
Subjective:    [name], female    DOB: 1941-06-27, 74 y.o.   MRN: [id number]  HPI Patient seen for medical follow-up. She has multiple chronic problems including history of obesity, hypothyroidism, hyperlipidemia, chronic insomnia, GERD, IBS, fibromyalgia, prediabetes, kidney stones, and history of breast cancer. Here to discuss several issues as follows  Diffuse alopecia. Noted for several months. Recent TSH normal range. No localized alopecia.  History of prediabetes. A1c remains stable 6.1%. No polyuria or polydipsia.  Mostly asymptomatic nodular lesion left lateral arm. Occasionally itches. Nontender. No history of known trauma. Not changing in size.  Hyperlipidemia treated with simvastatin 10 mg daily. Denies myalgias. No history of CAD or known peripheral vascular disease.  Past Medical History  Diagnosis Date  . Chest pain, atypical     Negative stress test 02/2009  . Other and unspecified hyperlipidemia   . Personal history of urinary calculi   . Pain in joint, shoulder region   . Irritable bowel syndrome   . Chronic insomnia   . GERD (gastroesophageal reflux disease)   . History of colonic polyps   . Fibromyalgia   . Hypothyroidism   . Other abnormal glucose   . Stress incontinence, female   . Macular degeneration   . Cluster headaches     history of migraines  . Meniscus tear     history of right knee torn meniscus  . Arthritis   . Kidney stone   . Cataract   . Depression   . Pneumonia 9379,0240  . Asthma     hx of -no inhalers, no problems  . H/O hiatal hernia   . Breast cancer 08/08/13    right LOQ  . Hx of radiation therapy 10/29/13- 12/14/13    right chest wall 5040 cGy 28 sessions, right supraclavicular/axillary region 5040 cGy 28 sessions, right chest wall boost 1000 cGy 5 sessions  . Psoriasis     mild   Past Surgical History  Procedure Laterality Date  . Appendectomy    . Cholecystectomy    . Abdominal hysterectomy    .  Tonsillectomy    . Eye surgery      to repair macular hole  . Cataract extraction, bilateral    . Knee arthroscopy      > 10 years ago  . Joint replacement  03/15/11    left knee replacement  . Colonoscopy    . Ganglion cyst excision      rt foot  . Foot arthroplasty      lt   . Mass excision  11/04/2011    Procedure: EXCISION MASS;  Surgeon: Jean Nash., MD;  Location: [facility];  Service: Orthopedics;  Laterality: Right;  excisional biopsy right ulna mass  . Hemorrhoid surgery      03/1993  . Breast cyst aspiration      9 cysts  . Toe surgery      preventative crossover toe surg/right foot  . Toe surgery      left foot/screw  in 2nd toe  . Upper gastrointestinal endoscopy    . Skin tags removed      breast, panty line, neckline  . Bilateral total mastectomy with axillary lymph node dissection  08/30/2013    Dr Jean Nash  . Simple mastectomy with axillary sentinel node biopsy Left 08/30/2013    Procedure: Bilateral Breast Mastectomy ;  Surgeon: Jean Klein, MD;  Location: [facility];  Service: General;  Laterality: Left;  Marland Kitchen Mastectomy  w/ sentinel node biopsy Right 08/30/2013    Procedure: RIGHT  AXILLARY SENTINEL LYMPH NODE BIOPSY; Right Axillary Node Disection;  Surgeon: Jean Klein, MD;  Location: [facility];  Service: General;  Laterality: Right;  Right side nuc med 7:00   . Evacuation breast hematoma Left 08/31/2013    Procedure: EVACUATION HEMATOMA BREAST;  Surgeon: Jean Klein, MD;  Location: [facility];  Service: General;  Laterality: Left;    reports that she has quit smoking. She has never used smokeless tobacco. She reports that she does not drink alcohol or use illicit drugs. family history includes Brain cancer (age of onset: 95) in her cousin; Brain cancer (age of onset: 29) in her cousin; Brain cancer (age of onset: 7) in her maternal uncle; Breast cancer in her paternal aunt; Breast cancer (age of onset: 39) in her paternal aunt and paternal grandmother;  Breast cancer (age of onset: 3) in her sister; Breast cancer (age of onset: 30) in her mother; Cancer in her maternal grandmother; Diabetes in her maternal grandfather and mother; Stroke in her mother. No Known Allergies     Review of Systems  Constitutional: Positive for fatigue. Negative for unexpected weight change.  Eyes: Negative for visual disturbance.  Respiratory: Negative for cough, chest tightness, shortness of breath and wheezing.   Cardiovascular: Negative for chest pain, palpitations and leg swelling.  Genitourinary: Negative for dysuria.  Neurological: Negative for dizziness, seizures, syncope, weakness, light-headedness and headaches.       Objective:   Physical Exam  Constitutional: She is oriented to person, place, and time. She appears well-developed and well-nourished.  Neck: Neck supple. No thyromegaly present.  Cardiovascular: Normal rate and regular rhythm.   Pulmonary/Chest: Effort normal and breath sounds normal. No respiratory distress. She has no wheezes. She has no rales.  Musculoskeletal: She exhibits no edema.  Neurological: She is alert and oriented to person, place, and time.  Skin:  Left lateral arm-5-6 mm benign-appearing well-demarcated fibromatous lesion consistent with dermatofibroma  Psychiatric: She has a normal mood and affect. Her behavior is normal.          Assessment & Plan:  #1 benign dermatofibroma left arm. Reassurance #2 hypothyroidism. Recent TSH at goal. Continue current dose of levothyroxin #3 generalized alopecia. Thyroid normals above. Avoid traction on hair.  ?age related. #4 history of prediabetes. A1c stable. Continue weight loss efforts #5 hyperlipidemia. Recent lipids reviewed with patient and stable #6 fatigue with obstructive sleep apnea by recent home study. She has pending follow-up with pulmonary.

## 2015-05-07 NOTE — Patient Instructions (Signed)
You have a dermatofibroma on your left arm.  This is a benign lesion.

## 2015-05-07 NOTE — Progress Notes (Signed)
Pre visit review using our clinic review tool, if applicable. No additional management support is needed unless otherwise documented below in the visit note. 

## 2015-05-08 ENCOUNTER — Encounter: Payer: Self-pay | Admitting: Family Medicine

## 2015-05-08 DIAGNOSIS — M1711 Unilateral primary osteoarthritis, right knee: Secondary | ICD-10-CM | POA: Diagnosis not present

## 2015-05-13 ENCOUNTER — Telehealth: Payer: Self-pay | Admitting: Internal Medicine

## 2015-05-13 DIAGNOSIS — R7303 Prediabetes: Secondary | ICD-10-CM

## 2015-05-13 NOTE — Telephone Encounter (Signed)
Pt has a Sep appt and is asking if she should have labs before this visit.

## 2015-05-14 NOTE — Telephone Encounter (Signed)
Future orders placed, please schedule lab appt

## 2015-05-14 NOTE — Telephone Encounter (Signed)
Ok to schedule BMET and A1c - 790.29

## 2015-05-14 NOTE — Telephone Encounter (Signed)
PT SCHEDULED .Marland Kitchen CK

## 2015-05-15 ENCOUNTER — Ambulatory Visit: Payer: Medicare Other | Admitting: Physical Therapy

## 2015-05-15 ENCOUNTER — Encounter: Payer: Self-pay | Admitting: Physical Therapy

## 2015-05-15 DIAGNOSIS — Z9013 Acquired absence of bilateral breasts and nipples: Secondary | ICD-10-CM

## 2015-05-15 DIAGNOSIS — I972 Postmastectomy lymphedema syndrome: Secondary | ICD-10-CM | POA: Diagnosis not present

## 2015-05-15 DIAGNOSIS — R0789 Other chest pain: Secondary | ICD-10-CM

## 2015-05-15 NOTE — Therapy (Signed)
[place], [place], [address] Phone: [phone]   Fax:  [phone]  Physical Therapy Treatment  Patient Details  Name: Jean Nash MRN: [id number] Date of Birth: [birth year] Referring Provider:  Chauncey Cruel, MD  Encounter Date: 05/15/2015      PT End of Session - 05/15/15 1146    Visit Number 2   Number of Visits 8   Date for PT Re-Evaluation 06/26/15   PT Start Time 9242   PT Stop Time 1055   PT Time Calculation (min) 37 min   Activity Tolerance Patient tolerated treatment well   Behavior During Therapy [facility] for tasks assessed/performed      Past Medical History  Diagnosis Date  . Chest pain, atypical     Negative stress test 02/2009  . Other and unspecified hyperlipidemia   . Personal history of urinary calculi   . Pain in joint, shoulder region   . Irritable bowel syndrome   . Chronic insomnia   . GERD (gastroesophageal reflux disease)   . History of colonic polyps   . Fibromyalgia   . Hypothyroidism   . Other abnormal glucose   . Stress incontinence, female   . Macular degeneration   . Cluster headaches     history of migraines  . Meniscus tear     history of right knee torn meniscus  . Arthritis   . Kidney stone   . Cataract   . Depression   . Pneumonia 6834,1962  . Asthma     hx of -no inhalers, no problems  . H/O hiatal hernia   . Breast cancer 08/08/13    right LOQ  . Hx of radiation therapy 10/29/13- 12/14/13    right chest wall 5040 cGy 28 sessions, right supraclavicular/axillary region 5040 cGy 28 sessions, right chest wall boost 1000 cGy 5 sessions  . Psoriasis     mild    Past Surgical History  Procedure Laterality Date  . Appendectomy    . Cholecystectomy    . Abdominal hysterectomy    . Tonsillectomy    . Eye surgery      to repair macular hole  . Cataract extraction, bilateral    . Knee arthroscopy      > 10 years ago  . Joint replacement   03/15/11    left knee replacement  . Colonoscopy    . Ganglion cyst excision      rt foot  . Foot arthroplasty      lt   . Mass excision  11/04/2011    Procedure: EXCISION MASS;  Surgeon: Cammie Sickle., MD;  Location: [place];  Service: Orthopedics;  Laterality: Right;  excisional biopsy right ulna mass  . Hemorrhoid surgery      03/1993  . Breast cyst aspiration      9 cysts  . Toe surgery      preventative crossover toe surg/right foot  . Toe surgery      left foot/screw  in 2nd toe  . Upper gastrointestinal endoscopy    . Skin tags removed      breast, panty line, neckline  . Bilateral total mastectomy with axillary lymph node dissection  08/30/2013    Dr Barry Dienes  . Simple mastectomy with axillary sentinel node biopsy Left 08/30/2013    Procedure: Bilateral Breast Mastectomy ;  Surgeon: Stark Klein, MD;  Location: [facility];  Service: General;  Laterality: Left;  Marland Kitchen Mastectomy w/ sentinel node  biopsy Right 08/30/2013    Procedure: RIGHT  AXILLARY SENTINEL LYMPH NODE BIOPSY; Right Axillary Node Disection;  Surgeon: Stark Klein, MD;  Location: [facility];  Service: General;  Laterality: Right;  Right side nuc med 7:00   . Evacuation breast hematoma Left 08/31/2013    Procedure: EVACUATION HEMATOMA BREAST;  Surgeon: Stark Klein, MD;  Location: [facility];  Service: General;  Laterality: Left;    There were no vitals filed for this visit.  Visit Diagnosis:  Post-mastectomy lymphedema syndrome - Plan: PT plan of care cert/re-cert  S/P bilateral mastectomy - Plan: PT plan of care cert/re-cert  Tightness in chest - Plan: PT plan of care cert/re-cert      Subjective Assessment - 05/15/15 1023    Subjective Tried wearing sleeve for a while and now my right arm isn't bothering me at all, but I have pain in both laeral chest areas where I have the extra tissue. Yesterday I had pain in my left upper arm in one spot but I think it's my fibromyalgia.   Pertinent History Right  arm swelling began around end of March 2016 for unknown reasons.  She had a bilateral mastectomy with a right axillary node dissection removing 16 lymph nodes and 9 of those were positive.  Currently taking tamoxifen.    Patient Stated Goals Control lymphedema   Currently in Pain? Yes   Pain Score 6    Pain Location --  Right lateral chest   Pain Orientation Right   Pain Descriptors / Indicators Tender   Pain Type Chronic pain   Pain Onset More than a month ago   Pain Frequency Intermittent   Aggravating Factors  touching the area   Pain Relieving Factors not touching the area   Multiple Pain Sites No            [facility] PT Assessment - 05/15/15 0001    Palpation   Palpation comment Patient continues to have visible "extra tissue" present in her right lateral chest which she reports is hypersensitive to pain.  When this area is palpated, it is poorly tolerated but does not appear to be lymphedema as she suspects.           LYMPHEDEMA/ONCOLOGY QUESTIONNAIRE - 05/15/15 1030    Right Upper Extremity Lymphedema   15 cm Proximal to Olecranon Process 40 cm   10 cm Proximal to Olecranon Process 36.5 cm   Olecranon Process 26.4 cm   10 cm Proximal to Ulnar Styloid Process 23.7 cm   Just Proximal to Ulnar Styloid Process 17.2 cm   Across Hand at [employer] 17.8 cm   At [place] of 2nd Digit 5.9 cm               Short Term Clinic Goals - 05/15/15 1152    CC Short Term Goal  #1   Title Reduce right arm swelling at 10 cm proximal to her right olecranon process by >/= 1 cm   Time 4   Period Weeks   Status Not Met  And patient is satisfied with this outcome   CC Short Term Goal  #2   Title Report >/= 20% reduction in pain in right lateral trunk / scapular area to tolerate swimming and other daily tasks with less difficulty   Time 3   Period Weeks   Status New   CC Short Term Goal  #3   Title Patient will be able to report she able to use her right arm  to perform household  tasks including cooking with >/= 25% less difficulty   Time 3   Period Weeks   Status New             Long Term Clinic Goals - 05/15/15 1155    CC Long Term Goal  #1   Title Report >/= 20% reduction in pain in right lateral trunk / scapular area to tolerate swimming and other daily tasks with less difficulty   Time 6   Period Weeks   Status New   CC Long Term Goal  #2   Title Patient will be able to report she able to use her right arm to perform household tasks including cooking with >/= 25% less difficulty   Time 6   Period Weeks   Status New            Plan - 05/15/15 1147    Clinical Impression Statement Patient's concern about right arm swelling seems to have resolved.  She has been consistently swimming and that has possibly helped her manage her lympehdema.  She reports she wore her compression sleeve for several week but stopped because her arm symptoms seemed to have resolved.  She now has concerns about the hypersensitivity pain in her right lateral proximal trunk around the teres minor region.  This appears to be a chronic pain issue which she reports is not relieved by pain medication or exercise and impacts her daily life.  She has even met wtih 2 plastic surgeons to see if the extra tissue could be removed but was told that was not possible and that would likely not effect her pain.  While here today, I had her talk with a PT who specializes in dry needling as an option for pain reduction.  The PT Voncille Lo) felt this would be a good option to try.  Patient and her husband agreed to this.   Pt will benefit from skilled therapeutic intervention in order to improve on the following deficits Pain   Rehab Potential Good   Clinical Impairments Affecting Rehab Potential Chronic pain   PT Frequency 1x / week   PT Duration 6 weeks  For dry needling   PT Treatment/Interventions Manual techniques;Dry needling;Patient/family education;Therapeutic exercise;ADLs/Self  Care Home Management   PT Next Visit Plan Begin dry needling if approved by MD   Consulted and Agree with Plan of Care Patient;Family member/caregiver   Family Member Consulted Husband   PT Plan See above        Problem List Patient Active Problem List   Diagnosis Date Noted  . Headache 02/03/2015  . Atypical chest pain 07/15/2014  . Arthralgia 02/22/2014  . Psoriasis 02/22/2014  . Breast cancer of lower-outer quadrant of right female breast 08/09/2013  . Neck pain 06/22/2013  . Left knee pain 11/29/2012  . Cluster headache 10/04/2012  . Gastroenteritis 04/26/2012  . Reactive depression (situational) 04/26/2012  . Right wrist pain 02/02/2012  . Right foot pain 10/04/2011  . Tinea capitis 10/04/2011  . Nevus 06/05/2011  . Status post total knee replacement 04/06/2011  . Overactive bladder 03/14/2011  . MOLE 12/21/2010  . KNEE PAIN, LEFT 08/31/2010  . HEARING LOSS 08/17/2010  . PSORIASIS 02/26/2010  . HIRSUTISM 06/16/2009  . SHOULDER PAIN, LEFT 11/21/2008  . HELICOBACTER PYLORI INFECTION, HX OF 06/18/2008  . CYST, IDIOPATHIC 05/20/2008  . FOOT PAIN 04/22/2008  . IRRITABLE BOWEL SYNDROME 04/15/2008  . Asthma 03/19/2008  . GERD 03/19/2008  . COLONIC POLYPS, HX OF 03/19/2008  .  NEPHROLITHIASIS, HX OF 03/19/2008  . Hypothyroidism 03/18/2008  . Hyperlipidemia 03/18/2008  . INSOMNIA, CHRONIC 03/18/2008  . FIBROMYALGIA 03/18/2008  . Prediabetes 03/18/2008    Annia Friendly, PT 05/15/2015, 12:10 PM  [place], [place], [address] Phone: [phone]   Fax:  [phone]

## 2015-05-17 ENCOUNTER — Other Ambulatory Visit: Payer: Self-pay | Admitting: Internal Medicine

## 2015-05-21 ENCOUNTER — Other Ambulatory Visit: Payer: Self-pay | Admitting: *Deleted

## 2015-05-21 ENCOUNTER — Encounter: Payer: Self-pay | Admitting: Internal Medicine

## 2015-05-21 MED ORDER — TRAMADOL HCL 50 MG PO TABS: 50.0000 mg | ORAL_TABLET | Freq: Four times a day (QID) | ORAL | Status: DC | PRN

## 2015-05-22 ENCOUNTER — Encounter: Payer: Self-pay | Admitting: Family Medicine

## 2015-05-22 ENCOUNTER — Ambulatory Visit (INDEPENDENT_AMBULATORY_CARE_PROVIDER_SITE_OTHER): Payer: Medicare Other | Admitting: Family Medicine

## 2015-05-22 VITALS — BP 120/80 | HR 76 | Temp 98.0°F | Wt 195.0 lb

## 2015-05-22 DIAGNOSIS — R0789 Other chest pain: Secondary | ICD-10-CM | POA: Diagnosis not present

## 2015-05-22 NOTE — Progress Notes (Signed)
Pre visit review using our clinic review tool, if applicable. No additional management support is needed unless otherwise documented below in the visit note. 

## 2015-05-22 NOTE — Patient Instructions (Signed)

## 2015-05-22 NOTE — Progress Notes (Signed)
Subjective:    [name], female    DOB: [birth year], 74 y.o.   MRN: [id number]  HPI Patient seen with brief episode of atypical chest pain which occurred 2 nights ago while watching TV. She was sitting at rest and noticed some tightness and somewhat of a sharp pain right upper anterior chest. No exacerbating features. No associated dyspnea, diaphoresis, nausea, vomiting, cough, pleuritic pain, or any upper extremity pain. No cardiac history. She sometimes swims up to one and one half hours daily and has never had any exertional chest pains recently. She's had no episodes since Tuesday night. She has had some recent reflux symptoms and took some Zantac which helped.  No family history of premature CAD. Nonsmoker. She has past history of breast cancer  Past Medical History  Diagnosis Date  . Chest pain, atypical     Negative stress test 02/2009  . Other and unspecified hyperlipidemia   . Personal history of urinary calculi   . Pain in joint, shoulder region   . Irritable bowel syndrome   . Chronic insomnia   . GERD (gastroesophageal reflux disease)   . History of colonic polyps   . Fibromyalgia   . Hypothyroidism   . Other abnormal glucose   . Stress incontinence, female   . Macular degeneration   . Cluster headaches     history of migraines  . Meniscus tear     history of right knee torn meniscus  . Arthritis   . Kidney stone   . Cataract   . Depression   . Pneumonia 6503,[id number]  . Asthma     hx of -no inhalers, no problems  . H/O hiatal hernia   . Breast cancer 08/08/13    right LOQ  . Hx of radiation therapy 10/29/13- 12/14/13    right chest wall 5040 cGy 28 sessions, right supraclavicular/axillary region 5040 cGy 28 sessions, right chest wall boost 1000 cGy 5 sessions  . Psoriasis     mild   Past Surgical History  Procedure Laterality Date  . Appendectomy    . Cholecystectomy    . Abdominal hysterectomy    . Tonsillectomy    . Eye surgery      to  repair macular hole  . Cataract extraction, bilateral    . Knee arthroscopy      > 10 years ago  . Joint replacement  03/15/11    left knee replacement  . Colonoscopy    . Ganglion cyst excision      rt foot  . Foot arthroplasty      lt   . Mass excision  11/04/2011    Procedure: EXCISION MASS;  Surgeon: Cammie Sickle., MD;  Location: [facility];  Service: Orthopedics;  Laterality: Right;  excisional biopsy right ulna mass  . Hemorrhoid surgery      03/1993  . Breast cyst aspiration      9 cysts  . Toe surgery      preventative crossover toe surg/right foot  . Toe surgery      left foot/screw  in 2nd toe  . Upper gastrointestinal endoscopy    . Skin tags removed      breast, panty line, neckline  . Bilateral total mastectomy with axillary lymph node dissection  08/30/2013    Dr Barry Dienes  . Simple mastectomy with axillary sentinel node biopsy Left 08/30/2013    Procedure: Bilateral Breast Mastectomy ;  Surgeon: Stark Klein, MD;  Location: [facility];  Service: General;  Laterality: Left;  Marland Kitchen Mastectomy w/ sentinel node biopsy Right 08/30/2013    Procedure: RIGHT  AXILLARY SENTINEL LYMPH NODE BIOPSY; Right Axillary Node Disection;  Surgeon: Stark Klein, MD;  Location: [facility];  Service: General;  Laterality: Right;  Right side nuc med 7:00   . Evacuation breast hematoma Left 08/31/2013    Procedure: EVACUATION HEMATOMA BREAST;  Surgeon: Stark Klein, MD;  Location: [facility];  Service: General;  Laterality: Left;    reports that she has quit smoking. She has never used smokeless tobacco. She reports that she does not drink alcohol or use illicit drugs. family history includes Brain cancer (age of onset: 19) in her cousin; Brain cancer (age of onset: 36) in her cousin; Brain cancer (age of onset: 78) in her maternal uncle; Breast cancer in her paternal aunt; Breast cancer (age of onset: 46) in her paternal aunt and paternal grandmother; Breast cancer (age of onset: 65) in her  sister; Breast cancer (age of onset: 57) in her mother; Cancer in her maternal grandmother; Diabetes in her maternal grandfather and mother; Stroke in her mother. No Known Allergies    Review of Systems  Constitutional: Negative for fatigue.  HENT: Negative for trouble swallowing.   Eyes: Negative for visual disturbance.  Respiratory: Negative for cough, chest tightness, shortness of breath and wheezing.   Cardiovascular: Negative for palpitations and leg swelling.  Gastrointestinal: Negative for nausea, vomiting and abdominal pain.  Neurological: Negative for dizziness, seizures, syncope, weakness, light-headedness and headaches.  Hematological: Negative for adenopathy.       Objective:   Physical Exam  Constitutional: She appears well-developed and well-nourished. No distress.  Neck: Neck supple. No JVD present.  Cardiovascular: Normal rate and regular rhythm.   Pulmonary/Chest: Effort normal and breath sounds normal. No respiratory distress. She has no wheezes. She has no rales.  Slightly tender right and left upper sternal region and patient states this is chronic.  Musculoskeletal: She exhibits no edema.  Lymphadenopathy:    She has no cervical adenopathy.          Assessment & Plan:  Atypical right upper chest pain which was transient 2 days ago. EKG normal sinus rhythm with no acute changes. Doubt angina. We've recommended observation this time. Follow-up promptly if she has any recurrent symptoms. Consider short-term use of Zantac for reflux symptoms.

## 2015-05-23 ENCOUNTER — Telehealth: Payer: Self-pay | Admitting: Internal Medicine

## 2015-05-23 NOTE — Telephone Encounter (Signed)
Inform pt of plan limitations.  She may want to call [facility] to see how much it costs.  We may still be only able to prescribe 60 tabs at a time.

## 2015-05-23 NOTE — Telephone Encounter (Signed)
Notice of Denial Received from [facility] for Temazepam.  Plan only allows pt to received 60 day supply per 365 days.  Notice has been placed in providers folder for review.

## 2015-05-23 NOTE — Telephone Encounter (Signed)
Pt notified via mychart

## 2015-05-26 ENCOUNTER — Ambulatory Visit: Payer: Medicare Other | Attending: Oncology | Admitting: Physical Therapy

## 2015-05-26 DIAGNOSIS — C50511 Malignant neoplasm of lower-outer quadrant of right female breast: Secondary | ICD-10-CM | POA: Diagnosis not present

## 2015-05-26 DIAGNOSIS — R0789 Other chest pain: Secondary | ICD-10-CM

## 2015-05-26 DIAGNOSIS — M25519 Pain in unspecified shoulder: Secondary | ICD-10-CM | POA: Diagnosis not present

## 2015-05-26 DIAGNOSIS — Z9013 Acquired absence of bilateral breasts and nipples: Secondary | ICD-10-CM | POA: Insufficient documentation

## 2015-05-26 DIAGNOSIS — I972 Postmastectomy lymphedema syndrome: Secondary | ICD-10-CM | POA: Diagnosis not present

## 2015-05-26 DIAGNOSIS — I890 Lymphedema, not elsewhere classified: Secondary | ICD-10-CM | POA: Insufficient documentation

## 2015-05-26 NOTE — Therapy (Signed)
[facility] Outpatient Rehabilitation [facility] [address] [place], [place], [address] Phone: [phone]   Fax:  [phone]  Physical Therapy Treatment  Patient Details  Name: Jean Nash MRN: [id number] Date of Birth: 1941/09/08 Referring Provider:  Meda Coffee, DO  Encounter Date: 05/26/2015      PT End of Session - 05/26/15 1829    Visit Number 3   Number of Visits 8   Date for PT Re-Evaluation 06/26/15   Authorization Type Medicare   Authorization Time Period 06/26/15   PT Start Time 0301   PT Stop Time 0400   PT Time Calculation (min) 59 min   Activity Tolerance Patient tolerated treatment well   Behavior During Therapy [facility] for tasks assessed/performed      Past Medical History  Diagnosis Date  . Chest pain, atypical     Negative stress test 02/2009  . Other and unspecified hyperlipidemia   . Personal history of urinary calculi   . Pain in joint, shoulder region   . Irritable bowel syndrome   . Chronic insomnia   . GERD (gastroesophageal reflux disease)   . History of colonic polyps   . Fibromyalgia   . Hypothyroidism   . Other abnormal glucose   . Stress incontinence, female   . Macular degeneration   . Cluster headaches     history of migraines  . Meniscus tear     history of right knee torn meniscus  . Arthritis   . Kidney stone   . Cataract   . Depression   . Pneumonia 5747,3403  . Asthma     hx of -no inhalers, no problems  . H/O hiatal hernia   . Breast cancer 08/08/13    right LOQ  . Hx of radiation therapy 10/29/13- 12/14/13    right chest wall 5040 cGy 28 sessions, right supraclavicular/axillary region 5040 cGy 28 sessions, right chest wall boost 1000 cGy 5 sessions  . Psoriasis     mild    Past Surgical History  Procedure Laterality Date  . Appendectomy    . Cholecystectomy    . Abdominal hysterectomy    . Tonsillectomy    . Eye surgery      to repair macular hole  . Cataract extraction, bilateral    .  Knee arthroscopy      > 10 years ago  . Joint replacement  03/15/11    left knee replacement  . Colonoscopy    . Ganglion cyst excision      rt foot  . Foot arthroplasty      lt   . Mass excision  11/04/2011    Procedure: EXCISION MASS;  Surgeon: Wyn Forster., MD;  Location: [facility];  Service: Orthopedics;  Laterality: Right;  excisional biopsy right ulna mass  . Hemorrhoid surgery      03/1993  . Breast cyst aspiration      9 cysts  . Toe surgery      preventative crossover toe surg/right foot  . Toe surgery      left foot/screw  in 2nd toe  . Upper gastrointestinal endoscopy    . Skin tags removed      breast, panty line, neckline  . Bilateral total mastectomy with axillary lymph node dissection  08/30/2013    Dr Donell Beers  . Simple mastectomy with axillary sentinel node biopsy Left 08/30/2013    Procedure: Bilateral Breast Mastectomy ;  Surgeon: Almond Lint, MD;  [facility];  Service: General;  Laterality: Left;  Marland Kitchen Mastectomy w/ sentinel node biopsy Right 08/30/2013    Procedure: RIGHT  AXILLARY SENTINEL LYMPH NODE BIOPSY; Right Axillary Node Disection;  Surgeon: Stark Klein, MD;  Location: [place];  Service: General;  Laterality: Right;  Right side nuc med 7:00   . Evacuation breast hematoma Left 08/31/2013    Procedure: EVACUATION HEMATOMA BREAST;  Surgeon: Stark Klein, MD;  Location: [place];  Service: General;  Laterality: Left;    There were no vitals filed for this visit.  Visit Diagnosis:  Pain in joint, shoulder region, unspecified laterality  Post-mastectomy lymphedema syndrome  Tightness in chest      Subjective Assessment - 05/26/15 1507    Subjective I have very hypersensitive pain area under armpit bilaterally.  I am here to try trigger point dry needling.    Patient is accompained by: Family member  husband   Pertinent History Right arm swelling began around end of March 2016 for unknown reasons.  She had a bilateral mastectomy  with a right axillary node dissection removing 16 lymph nodes and 9 of those were positive.  Currently taking tamoxifen.    Patient Stated Goals to reduce pain under arms at hypersensitive area in order to lift arms above shoulder without pain   Pain Score 4    Pain Location Chest  under armpit.lateral chest bil   Pain Orientation Left;Right   Pain Type Chronic pain   Pain Onset More than a month ago   Pain Frequency Intermittent   Aggravating Factors  hypersensitive to touch.            [facility] PT Assessment - 05/26/15 1514    AROM   Right Shoulder Flexion 168 Degrees  with pain 2/10 after treatment   Right Shoulder ABduction 159 Degrees   Cervical Flexion 50   Cervical Extension 25   Cervical - Right Side Bend 25   Cervical - Left Side Bend 35   Cervical - Right Rotation 40   Cervical - Left Rotation 40                     [facility] Adult PT Treatment/Exercise - 05/26/15 1510    Shoulder Exercises: Stretch   Wall Stretch - Flexion 2 reps;30 seconds   Moist Heat Therapy   Number Minutes Moist Heat 12 Minutes   Moist Heat Location Shoulder  bil under armpit over lateral thoracic   Manual Therapy   Manual Therapy Myofascial release   Myofascial Release lats and teres major bil and R upper trap   Neck Exercises: Stretches   Upper Trapezius Stretch 2 reps;30 seconds  right side   Levator Stretch 2 reps;30 seconds  right side          Trigger Point Dry Needling - 05/26/15 1517    Consent Given? Yes   Education Handout Provided Yes   Muscles Treated Upper Body --  Lats/ teres major and minor/major bil   Upper Trapezius Response Twitch reponse elicited;Palpable increased muscle length  right side only              PT Education - 05/26/15 1828    Education provided Yes   Education Details Neck stretches and precautians for dry needling including rare pneumothorax and aftercare   Person(s) Educated Patient;Spouse   Methods  Explanation;Handout;Demonstration;Verbal cues   Comprehension Verbalized understanding;Returned Museum/gallery exhibitions officer Term Clinic Goals - 05/15/15 1152  CC Short Term Goal  #1   Title Reduce right arm swelling at 10 cm proximal to her right olecranon process by >/= 1 cm   Time 4   Period Weeks   Status Not Met  And patient is satisfied with this outcome   CC Short Term Goal  #2   Title Report >/= 20% reduction in pain in right lateral trunk / scapular area to tolerate swimming and other daily tasks with less difficulty   Time 3   Period Weeks   Status New   CC Short Term Goal  #3   Title Patient will be able to report she able to use her right arm to perform household tasks including cooking with >/= 25% less difficulty   Time 3   Period Weeks   Status New          PT Long Term Goals - 05/26/15 1833    PT LONG TERM GOAL #1   Title Pt will be able to turn neck without pain in order to improve functional use of neck and arm.   Time 5   Period Weeks   Status New   PT LONG TERM GOAL #2   Title Pt will be able to flex arm without pain level 2/10 or less   Time 5   Period Weeks   Status New           Long Term Clinic Goals - 05/15/15 1155    CC Long Term Goal  #1   Title Report >/= 20% reduction in pain in right lateral trunk / scapular area to tolerate swimming and other daily tasks with less difficulty   Time 6   Period Weeks   Status New   CC Long Term Goal  #2   Title Patient will be able to report she able to use her right arm to perform household tasks including cooking with >/= 25% less difficulty   Time 6   Period Weeks   Status New            Plan - 05/26/15 1830    Clinical Impression Statement Pt and husband attended treatment session and consent to dry needling trial with MFR and continued stretch at home.  Pt tolerated trigger point dry needling well and had symptome relief from 4/10 to 1-2/10 with flexion and abd of arm.  Pt also has  pain in R upper trap that was also addressed and pt was able to turn nexk with greater ease and AROM. Pt husband will attend up to 5 additional treatments if pain is improved or allevitated with this modality combination.    Pt will benefit from skilled therapeutic intervention in order to improve on the following deficits Pain   Rehab Potential Good   PT Frequency 1x / week   PT Duration 6 weeks   PT Treatment/Interventions Manual techniques;Dry needling;Patient/family education;Therapeutic exercise;ADLs/Self Care Home Management   PT Next Visit Plan assess dry needling benefit    Consulted and Agree with Plan of Care Patient;Family member/caregiver   Family Member Consulted husband   PT Plan See above        Problem List Patient Active Problem List   Diagnosis Date Noted  . Headache 02/03/2015  . Atypical chest pain 07/15/2014  . Arthralgia 02/22/2014  . Psoriasis 02/22/2014  . Breast cancer of lower-outer quadrant of right female breast 08/09/2013  . Neck pain 06/22/2013  . Left knee pain 11/29/2012  . Cluster headache 10/04/2012  .  Gastroenteritis 04/26/2012  . Reactive depression (situational) 04/26/2012  . Right wrist pain 02/02/2012  . Right foot pain 10/04/2011  . Tinea capitis 10/04/2011  . Nevus 06/05/2011  . Status post total knee replacement 04/06/2011  . Overactive bladder 03/14/2011  . MOLE 12/21/2010  . KNEE PAIN, LEFT 08/31/2010  . HEARING LOSS 08/17/2010  . PSORIASIS 02/26/2010  . HIRSUTISM 06/16/2009  . SHOULDER PAIN, LEFT 11/21/2008  . HELICOBACTER PYLORI INFECTION, HX OF 06/18/2008  . CYST, IDIOPATHIC 05/20/2008  . FOOT PAIN 04/22/2008  . IRRITABLE BOWEL SYNDROME 04/15/2008  . Asthma 03/19/2008  . GERD 03/19/2008  . COLONIC POLYPS, HX OF 03/19/2008  . NEPHROLITHIASIS, HX OF 03/19/2008  . Hypothyroidism 03/18/2008  . Hyperlipidemia 03/18/2008  . INSOMNIA, CHRONIC 03/18/2008  . FIBROMYALGIA 03/18/2008  . Prediabetes 03/18/2008    Voncille Lo, PT 05/26/2015 6:41 PM Phone: [phone] Fax: [facility] [address] [place], [place], [address] Phone: [phone]   Fax:  [phone]

## 2015-05-26 NOTE — Patient Instructions (Signed)
Trigger Point Dry Needling  . What is Trigger Point Dry Needling (DN)? o DN is a physical therapy technique used to treat muscle pain and dysfunction. Specifically, DN helps deactivate muscle trigger points (muscle knots).  o A thin filiform needle is used to penetrate the skin and stimulate the underlying trigger point. The goal is for a local twitch response (LTR) to occur and for the trigger point to relax. No medication of any kind is injected during the procedure.   . What Does Trigger Point Dry Needling Feel Like?  o The procedure feels different for each individual patient. Some patients report that they do not actually feel the needle enter the skin and overall the process is not painful. Very mild bleeding may occur. However, many patients feel a deep cramping in the muscle in which the needle was inserted. This is the local twitch response.   Marland Kitchen How Will I feel after the treatment? o Soreness is normal, and the onset of soreness may not occur for a few hours. Typically this soreness does not last longer than two days.  o Bruising is uncommon, however; ice can be used to decrease any possible bruising.  o In rare cases feeling tired or nauseous after the treatment is normal. In addition, your symptoms may get worse before they get better, this period will typically not last longer than 24 hours.   . What Can I do After My Treatment? o Increase your hydration by drinking more water for the next 24 hours. o You may place ice or heat on the areas treated that have become sore, however, do not use heat on inflamed or bruised areas. Heat often brings more relief post needling. o You can continue your regular activities, but vigorous activity is not recommended initially after the treatment for 24 hours. o DN is best combined with other physical therapy such as strengthening, stretching, and other therapies.  Jean Nash, PT 05/26/2015 3:14 PM Phone: [phone] Fax: [phone]

## 2015-06-02 ENCOUNTER — Ambulatory Visit: Payer: Medicare Other | Admitting: Physical Therapy

## 2015-06-02 DIAGNOSIS — Z9013 Acquired absence of bilateral breasts and nipples: Secondary | ICD-10-CM | POA: Diagnosis not present

## 2015-06-02 DIAGNOSIS — I972 Postmastectomy lymphedema syndrome: Secondary | ICD-10-CM | POA: Diagnosis not present

## 2015-06-02 DIAGNOSIS — I890 Lymphedema, not elsewhere classified: Secondary | ICD-10-CM

## 2015-06-02 DIAGNOSIS — M25519 Pain in unspecified shoulder: Secondary | ICD-10-CM

## 2015-06-02 DIAGNOSIS — C50511 Malignant neoplasm of lower-outer quadrant of right female breast: Secondary | ICD-10-CM | POA: Diagnosis not present

## 2015-06-02 DIAGNOSIS — R0789 Other chest pain: Secondary | ICD-10-CM | POA: Diagnosis not present

## 2015-06-02 NOTE — Therapy (Signed)
[facility] Outpatient Rehabilitation [facility] [address] [place], [place], [address] Phone: [phone]   Fax:  [phone]  Physical Therapy Treatment  Patient Details  Name: Jean Nash MRN: [id number] Date of Birth: 24-Jun-1941 Referring Provider:  Meda Coffee, DO  Encounter Date: 06/02/2015      PT End of Session - 06/02/15 1742    Visit Number 4   Number of Visits 8   Date for PT Re-Evaluation 06/26/15   Authorization Type Medicare   Authorization Time Period 06/26/15   PT Start Time 0430   PT Stop Time 0525   PT Time Calculation (min) 55 min   Activity Tolerance Patient tolerated treatment well   Behavior During Therapy [facility] for tasks assessed/performed      Past Medical History  Diagnosis Date  . Chest pain, atypical     Negative stress test 02/2009  . Other and unspecified hyperlipidemia   . Personal history of urinary calculi   . Pain in joint, shoulder region   . Irritable bowel syndrome   . Chronic insomnia   . GERD (gastroesophageal reflux disease)   . History of colonic polyps   . Fibromyalgia   . Hypothyroidism   . Other abnormal glucose   . Stress incontinence, female   . Macular degeneration   . Cluster headaches     history of migraines  . Meniscus tear     history of right knee torn meniscus  . Arthritis   . Kidney stone   . Cataract   . Depression   . Pneumonia 0879,0379  . Asthma     hx of -no inhalers, no problems  . H/O hiatal hernia   . Breast cancer 08/08/13    right LOQ  . Hx of radiation therapy 10/29/13- 12/14/13    right chest wall 5040 cGy 28 sessions, right supraclavicular/axillary region 5040 cGy 28 sessions, right chest wall boost 1000 cGy 5 sessions  . Psoriasis     mild    Past Surgical History  Procedure Laterality Date  . Appendectomy    . Cholecystectomy    . Abdominal hysterectomy    . Tonsillectomy    . Eye surgery      to repair macular hole  . Cataract extraction, bilateral    .  Knee arthroscopy      > 10 years ago  . Joint replacement  03/15/11    left knee replacement  . Colonoscopy    . Ganglion cyst excision      rt foot  . Foot arthroplasty      lt   . Mass excision  11/04/2011    Procedure: EXCISION MASS;  Surgeon: Wyn Forster., MD;  Location: [facility];  Service: Orthopedics;  Laterality: Right;  excisional biopsy right ulna mass  . Hemorrhoid surgery      03/1993  . Breast cyst aspiration      9 cysts  . Toe surgery      preventative crossover toe surg/right foot  . Toe surgery      left foot/screw  in 2nd toe  . Upper gastrointestinal endoscopy    . Skin tags removed      breast, panty line, neckline  . Bilateral total mastectomy with axillary lymph node dissection  08/30/2013    Dr Donell Beers  . Simple mastectomy with axillary sentinel node biopsy Left 08/30/2013    Procedure: Bilateral Breast Mastectomy ;  Surgeon: Almond Lint, MD;  [facility];  Service: General;  Laterality: Left;  Marland Kitchen Mastectomy w/ sentinel node biopsy Right 08/30/2013    Procedure: RIGHT  AXILLARY SENTINEL LYMPH NODE BIOPSY; Right Axillary Node Disection;  Surgeon: Stark Klein, MD;  Location: [place];  Service: General;  Laterality: Right;  Right side nuc med 7:00   . Evacuation breast hematoma Left 08/31/2013    Procedure: EVACUATION HEMATOMA BREAST;  Surgeon: Stark Klein, MD;  Location: [place];  Service: General;  Laterality: Left;    There were no vitals filed for this visit.  Visit Diagnosis:  Pain in joint, shoulder region, unspecified laterality  Post-mastectomy lymphedema syndrome  Tightness in chest  S/P bilateral mastectomy  Lymphedema  Breast cancer of lower-outer quadrant of right female breast      Subjective Assessment - 06/02/15 1636    Subjective Pain level  has decreased  to 3/10. i cant believe how much better I feel.   Patient is accompained by: Family member  husband   Pertinent History Right arm swelling began around  end of March 2016 for unknown reasons.  She had a bilateral mastectomy with a right axillary node dissection removing 16 lymph nodes and 9 of those were positive.  Currently taking tamoxifen.    Patient Stated Goals to reduce pain under arms at hypersensitive area in order to lift arms above shoulder without pain   Currently in Pain? Yes   Pain Score 3    Pain Location Chest  lats and teres major   Pain Orientation Right   Pain Descriptors / Indicators Tender   Pain Type Chronic pain   Pain Onset More than a month ago   Pain Frequency Intermittent   Aggravating Factors  hypersensitive to touch                         [facility] Adult PT Treatment/Exercise - 06/02/15 1630    Moist Heat Therapy   Number Minutes Moist Heat 15 Minutes   Moist Heat Location Cervical  and right chest   Manual Therapy   Manual Therapy Myofascial release   Myofascial Release lats and teres major bil and R upper trap          Trigger Point Dry Needling - 06/02/15 1741    Muscles Treated Upper Body Upper trapezius;Levator scapulae  Right lats and teres major   Upper Trapezius Response Twitch reponse elicited;Palpable increased muscle length  right side   Levator Scapulae Response Twitch response elicited;Palpable increased muscle length  right side                 Short Term Clinic Goals - 05/15/15 1152    CC Short Term Goal  #1   Title Reduce right arm swelling at 10 cm proximal to her right olecranon process by >/= 1 cm   Time 4   Period Weeks   Status Not Met  And patient is satisfied with this outcome   CC Short Term Goal  #2   Title Report >/= 20% reduction in pain in right lateral trunk / scapular area to tolerate swimming and other daily tasks with less difficulty   Time 3   Period Weeks   Status New   CC Short Term Goal  #3   Title Patient will be able to report she able to use her right arm to perform household tasks including cooking with >/= 25% less difficulty    Time 3   Period Weeks   Status New  PT Long Term Goals - 06/02/15 1746    PT LONG TERM GOAL #1   Title Pt will be able to turn neck without pain in order to improve functional use of neck and arm.   Time 5   Period Weeks   Status On-going   PT LONG TERM GOAL #2   Title Pt will be able to flex arm without pain level 2/10 or less   Time 5   Period Weeks   Status On-going           Long Term Clinic Goals - 05/15/15 1155    CC Long Term Goal  #1   Title Report >/= 20% reduction in pain in right lateral trunk / scapular area to tolerate swimming and other daily tasks with less difficulty   Time 6   Period Weeks   Status New   CC Long Term Goal  #2   Title Patient will be able to report she able to use her right arm to perform household tasks including cooking with >/= 25% less difficulty   Time 6   Period Weeks   Status New            Plan - 06/02/15 1743    Clinical Impression Statement Pt enters clinic with 3/10 pain in Right chest and pleased with results of one dry needling session for pain in Right lat, teresmajor and R upper trap.  Pt will return next treatment and PT will assess if there is any need for additional treatments.  Pt is able to swim and raise arm without exacerbating pain on R chest.  Pt does illicit pain while flexing past midline.  Will try to alleviate in next  treatment with possible D C after 1 more treatment.   Pt will benefit from skilled therapeutic intervention in order to improve on the following deficits Pain   Rehab Potential Good   Clinical Impairments Affecting Rehab Potential Chronic pain   PT Frequency 1x / week   PT Duration 6 weeks   PT Treatment/Interventions Manual techniques;Dry needling;Patient/family education;Therapeutic exercise;ADLs/Self Care Home Management   PT Next Visit Plan Assess goals and assess benefit of dry needling, Gcode for next visit and probable D C   PT Home Exercise Plan continued aquatics    Consulted and Agree with Plan of Care Patient;Family member/caregiver   Family Member Consulted husband        Problem List Patient Active Problem List   Diagnosis Date Noted  . Headache 02/03/2015  . Atypical chest pain 07/15/2014  . Arthralgia 02/22/2014  . Psoriasis 02/22/2014  . Breast cancer of lower-outer quadrant of right female breast 08/09/2013  . Neck pain 06/22/2013  . Left knee pain 11/29/2012  . Cluster headache 10/04/2012  . Gastroenteritis 04/26/2012  . Reactive depression (situational) 04/26/2012  . Right wrist pain 02/02/2012  . Right foot pain 10/04/2011  . Tinea capitis 10/04/2011  . Nevus 06/05/2011  . Status post total knee replacement 04/06/2011  . Overactive bladder 03/14/2011  . MOLE 12/21/2010  . KNEE PAIN, LEFT 08/31/2010  . HEARING LOSS 08/17/2010  . PSORIASIS 02/26/2010  . HIRSUTISM 06/16/2009  . SHOULDER PAIN, LEFT 11/21/2008  . HELICOBACTER PYLORI INFECTION, HX OF 06/18/2008  . CYST, IDIOPATHIC 05/20/2008  . FOOT PAIN 04/22/2008  . IRRITABLE BOWEL SYNDROME 04/15/2008  . Asthma 03/19/2008  . GERD 03/19/2008  . COLONIC POLYPS, HX OF 03/19/2008  . NEPHROLITHIASIS, HX OF 03/19/2008  . Hypothyroidism 03/18/2008  . Hyperlipidemia 03/18/2008  .  INSOMNIA, CHRONIC 03/18/2008  . FIBROMYALGIA 03/18/2008  . Prediabetes 03/18/2008    Voncille Lo, PT 06/02/2015 5:49 PM Phone: [phone] Fax: [facility] [place], [place], [address] Phone: [phone]   Fax:  [phone]

## 2015-06-03 ENCOUNTER — Encounter: Payer: Self-pay | Admitting: Oncology

## 2015-06-05 DIAGNOSIS — M19071 Primary osteoarthritis, right ankle and foot: Secondary | ICD-10-CM | POA: Diagnosis not present

## 2015-06-09 ENCOUNTER — Ambulatory Visit: Payer: Medicare Other | Admitting: Physical Therapy

## 2015-06-09 DIAGNOSIS — I972 Postmastectomy lymphedema syndrome: Secondary | ICD-10-CM

## 2015-06-09 DIAGNOSIS — R0789 Other chest pain: Secondary | ICD-10-CM

## 2015-06-09 DIAGNOSIS — C50511 Malignant neoplasm of lower-outer quadrant of right female breast: Secondary | ICD-10-CM

## 2015-06-09 DIAGNOSIS — I890 Lymphedema, not elsewhere classified: Secondary | ICD-10-CM

## 2015-06-09 DIAGNOSIS — Z9013 Acquired absence of bilateral breasts and nipples: Secondary | ICD-10-CM | POA: Diagnosis not present

## 2015-06-09 DIAGNOSIS — M25519 Pain in unspecified shoulder: Secondary | ICD-10-CM

## 2015-06-09 NOTE — Therapy (Addendum)
[place], [place], [address] Phone: [phone]   Fax:  [phone]  Physical Therapy Treatment/Discharge NOTE  Patient Details  Name: Jean Nash MRN: [id number] Date of Birth: [birth year] Referring Provider:  Rosine Abe, DO  Encounter Date: 06/09/2015      PT End of Session - 06/09/15 1924    Visit Number 5   Number of Visits 8   Date for PT Re-Evaluation 06/26/15   Authorization Type Medicare   Authorization Time Period 06/26/15   PT Start Time 0300   PT Stop Time 0359   PT Time Calculation (min) 59 min   Activity Tolerance Patient tolerated treatment well   Behavior During Therapy [facility] for tasks assessed/performed      Past Medical History  Diagnosis Date  . Chest pain, atypical     Negative stress test 02/2009  . Other and unspecified hyperlipidemia   . Personal history of urinary calculi   . Pain in joint, shoulder region   . Irritable bowel syndrome   . Chronic insomnia   . GERD (gastroesophageal reflux disease)   . History of colonic polyps   . Fibromyalgia   . Hypothyroidism   . Other abnormal glucose   . Stress incontinence, female   . Macular degeneration   . Cluster headaches     history of migraines  . Meniscus tear     history of right knee torn meniscus  . Arthritis   . Kidney stone   . Cataract   . Depression   . Pneumonia 4818,5631  . Asthma     hx of -no inhalers, no problems  . H/O hiatal hernia   . Breast cancer 08/08/13    right LOQ  . Hx of radiation therapy 10/29/13- 12/14/13    right chest wall 5040 cGy 28 sessions, right supraclavicular/axillary region 5040 cGy 28 sessions, right chest wall boost 1000 cGy 5 sessions  . Psoriasis     mild    Past Surgical History  Procedure Laterality Date  . Appendectomy    . Cholecystectomy    . Abdominal hysterectomy    . Tonsillectomy    . Eye surgery      to repair macular hole  . Cataract extraction,  bilateral    . Knee arthroscopy      > 10 years ago  . Joint replacement  03/15/11    left knee replacement  . Colonoscopy    . Ganglion cyst excision      rt foot  . Foot arthroplasty      lt   . Mass excision  11/04/2011    Procedure: EXCISION MASS;  Surgeon: Cammie Sickle., MD;  Location: [place];  Service: Orthopedics;  Laterality: Right;  excisional biopsy right ulna mass  . Hemorrhoid surgery      03/1993  . Breast cyst aspiration      9 cysts  . Toe surgery      preventative crossover toe surg/right foot  . Toe surgery      left foot/screw  in 2nd toe  . Upper gastrointestinal endoscopy    . Skin tags removed      breast, panty line, neckline  . Bilateral total mastectomy with axillary lymph node dissection  08/30/2013    Dr Barry Dienes  . Simple mastectomy with axillary sentinel node biopsy Left 08/30/2013    Procedure: Bilateral Breast Mastectomy ;  Surgeon: Stark Klein, MD;  Location: [place];  Service: General;  Laterality: Left;  Marland Kitchen Mastectomy w/ sentinel node biopsy Right 08/30/2013    Procedure: RIGHT  AXILLARY SENTINEL LYMPH NODE BIOPSY; Right Axillary Node Disection;  Surgeon: Stark Klein, MD;  Location: [place];  Service: General;  Laterality: Right;  Right side nuc med 7:00   . Evacuation breast hematoma Left 08/31/2013    Procedure: EVACUATION HEMATOMA BREAST;  Surgeon: Stark Klein, MD;  Location: [place];  Service: General;  Laterality: Left;    There were no vitals filed for this visit.  Visit Diagnosis:  Pain in joint, shoulder region, unspecified laterality  Post-mastectomy lymphedema syndrome  Tightness in chest  S/P bilateral mastectomy  Lymphedema  Breast cancer of lower-outer quadrant of right female breast      Subjective Assessment - 06/09/15 1502    Subjective Pain level has decreased to 2-3/10.  I was very sore after last needling but  I am here for my 3rd time.    Patient is accompained by: Family member  husband    Pertinent History Right arm swelling began around end of March 2016 for unknown reasons.  She had a bilateral mastectomy with a right axillary node dissection removing 16 lymph nodes and 9 of those were positive.  Currently taking tamoxifen.    Patient Stated Goals to reduce pain under arms at hypersensitive area in order to lift arms above shoulder without pain   Currently in Pain? Yes   Pain Score 2    Pain Location Chest   Pain Orientation Right   Pain Descriptors / Indicators Tender  hypersensitive   Pain Type Chronic pain   Pain Onset More than a month ago   Pain Frequency Intermittent   Aggravating Factors  hypersenstive to touch   Pain Relieving Factors dry needling            [facility] PT Assessment - 06/09/15 1543    AROM   Right Shoulder Flexion 168 Degrees  with pain 1-2/10 after treatment   Right Shoulder ABduction 159 Degrees   Cervical Flexion 50   Cervical Extension 25   Cervical - Right Side Bend 40   Cervical - Left Side Bend 38   Cervical - Right Rotation 50   Cervical - Left Rotation 50                     [facility] Adult PT Treatment/Exercise - 06/09/15 1535    Moist Heat Therapy   Number Minutes Moist Heat 15 Minutes   Moist Heat Location Cervical  right chest   Manual Therapy   Manual Therapy Myofascial release;Soft tissue mobilization   Soft tissue mobilization bil cervical, upper traps and levator   Myofascial Release lats and teres major Rt only and R upper trap   Neck Exercises: Stretches   Upper Trapezius Stretch 2 reps;30 seconds  bil   Levator Stretch 2 reps;30 seconds  bil          Trigger Point Dry Needling - 06/09/15 1512    Consent Given? Yes   Education Handout Provided Yes   Muscles Treated Upper Body Upper trapezius;Levator scapulae  Right lats and teres major with decreased pain after treatme   Upper Trapezius Response Palpable increased muscle length  decreased pain   Levator Scapulae Response Palpable increased  muscle length  decreased pain              PT Education - 06/09/15 1545    Education provided Yes   Education Details neck  stretches and continued exercise in pool for ROM of Bil UE and stretch of fascial tissue   Person(s) Educated Patient;Spouse   Methods Explanation;Demonstration   Comprehension Verbalized understanding;Returned demonstration           Short Term Clinic Goals - 05/15/15 1152    CC Short Term Goal  #1   Title Reduce right arm swelling at 10 cm proximal to her right olecranon process by >/= 1 cm   Time 4   Period Weeks   Status Not Met  And patient is satisfied with this outcome   CC Short Term Goal  #2   Title Report >/= 20% reduction in pain in right lateral trunk / scapular area to tolerate swimming and other daily tasks with less difficulty   Time 3   Period Weeks   Status New   CC Short Term Goal  #3   Title Patient will be able to report she able to use her right arm to perform household tasks including cooking with >/= 25% less difficulty   Time 3   Period Weeks   Status New          PT Long Term Goals - 06/09/15 1510    PT LONG TERM GOAL #1   Title Pt will be able to turn neck without pain in order to improve functional use of neck and arm.   Time 5   Period Weeks   Status Achieved   PT LONG TERM GOAL #2   Title Pt will be able to flex arm without pain level 2/10 or less   Baseline Pain 1-2/10   Time 5   Period Weeks   Status Achieved           Long Term Clinic Goals - 05/15/15 1155    CC Long Term Goal  #1   Title Report >/= 20% reduction in pain in right lateral trunk / scapular area to tolerate swimming and other daily tasks with less difficulty   Time 6   Period Weeks   Status New   CC Long Term Goal  #2   Title Patient will be able to report she able to use her right arm to perform household tasks including cooking with >/= 25% less difficulty   Time 6   Period Weeks   Status New      G code Self Care  Goals  Status  CH, discharge status CI      Plan - 06/09/15 1925    Clinical Impression Statement Pt enters clinic with 2-3/10 pain in Right chest and is pleased with results of 3 dry needling sessions. Pain level is reduced to 1/10 after treatment in bil upper trap and R sided chest. Pt is able to swim and raise arm withtou excerbating pai in Right chest.  Pt is able to adduct past midline without exacerbating pain after treatment.  Pt with increased Right flex and abd post treatment.  goals for reduction of Right side chest pain/fascial restrictio achieved   PT Next Visit Plan DC this visit due to achieved goals of reduction of pain   PT Home Exercise Plan continued aquatics        Problem List Patient Active Problem List   Diagnosis Date Noted  . Headache 02/03/2015  . Atypical chest pain 07/15/2014  . Arthralgia 02/22/2014  . Psoriasis 02/22/2014  . Breast cancer of lower-outer quadrant of right female breast 08/09/2013  . Neck pain 06/22/2013  . Left knee pain 11/29/2012  .  Cluster headache 10/04/2012  . Gastroenteritis 04/26/2012  . Reactive depression (situational) 04/26/2012  . Right wrist pain 02/02/2012  . Right foot pain 10/04/2011  . Tinea capitis 10/04/2011  . Nevus 06/05/2011  . Status post total knee replacement 04/06/2011  . Overactive bladder 03/14/2011  . MOLE 12/21/2010  . KNEE PAIN, LEFT 08/31/2010  . HEARING LOSS 08/17/2010  . PSORIASIS 02/26/2010  . HIRSUTISM 06/16/2009  . SHOULDER PAIN, LEFT 11/21/2008  . HELICOBACTER PYLORI INFECTION, HX OF 06/18/2008  . CYST, IDIOPATHIC 05/20/2008  . FOOT PAIN 04/22/2008  . IRRITABLE BOWEL SYNDROME 04/15/2008  . Asthma 03/19/2008  . GERD 03/19/2008  . COLONIC POLYPS, HX OF 03/19/2008  . NEPHROLITHIASIS, HX OF 03/19/2008  . Hypothyroidism 03/18/2008  . Hyperlipidemia 03/18/2008  . INSOMNIA, CHRONIC 03/18/2008  . FIBROMYALGIA 03/18/2008  . Prediabetes 03/18/2008    Lawrie Beardsley, PT 06/09/2015 7:35 PM Phone:  [phone] Fax: [phone]  [facility] [address] [place], [place], [address] Phone: [phone]   Fax:  [phone]   PHYSICAL THERAPY DISCHARGE SUMMARY  Visits from Start of Care: 5  Current functional level related to goals / functional outcomes: See above.  All goals for orthopedic and Cancer center met as of last visit   Remaining deficits: 2-3/10 pain improved from 9-10/10 hypersensitivity on R lateral chest   Education / Equipment: Continue HEP and aquatics for maximizing functional abilities Plan: Patient agrees to discharge.  Patient goals were met. Patient is being discharged due to meeting the stated rehab goals.  ?????       . Lawrie Beardsley, PT 06/10/2015 11:52 AM Phone: [phone] Fax: [phone]       

## 2015-06-12 ENCOUNTER — Encounter: Payer: Self-pay | Admitting: Oncology

## 2015-06-23 ENCOUNTER — Institutional Professional Consult (permissible substitution): Payer: Medicare Other | Admitting: Internal Medicine

## 2015-06-25 ENCOUNTER — Ambulatory Visit (INDEPENDENT_AMBULATORY_CARE_PROVIDER_SITE_OTHER): Payer: Medicare Other | Admitting: Internal Medicine

## 2015-06-25 ENCOUNTER — Encounter: Payer: Self-pay | Admitting: Internal Medicine

## 2015-06-25 ENCOUNTER — Telehealth: Payer: Self-pay | Admitting: Internal Medicine

## 2015-06-25 VITALS — BP 122/78 | HR 76 | Ht 59.0 in | Wt 190.4 lb

## 2015-06-25 DIAGNOSIS — G4700 Insomnia, unspecified: Secondary | ICD-10-CM | POA: Diagnosis not present

## 2015-06-25 DIAGNOSIS — G4733 Obstructive sleep apnea (adult) (pediatric): Secondary | ICD-10-CM | POA: Insufficient documentation

## 2015-06-25 DIAGNOSIS — M1711 Unilateral primary osteoarthritis, right knee: Secondary | ICD-10-CM | POA: Diagnosis not present

## 2015-06-25 NOTE — Telephone Encounter (Signed)
Do you mean 06/26/15? 07/27/15 is a Sunday.

## 2015-06-25 NOTE — Progress Notes (Signed)
06/25/15- 74 yoF negligible smoker referred courtesy of Dr Chaney Born.Had home sleep study-printed and attached to consult sheet. Unattended Home Sleep Test 03/06/2015- severe obstructive sleep apnea/hypopnea syndrome, AHI 44.9 per hour with desaturation to 74% and mean saturation only 89% on room air. Body weight 190 pounds   [place] 4/24 Medical problems including hypothyroidism, chronic insomnia, asthma, GERD, history breast cancer R, Husband here   Daughter is pediatrician in [place] She gives long history as a short sleeper averaging 3-5 hours per night with difficulty initiating and maintaining sleep on a chronic basis. Currently using Restoril 15 mg with a little benefit. Bedtime between 11 and midnight estimating 15 minutes sleep latency during which time she counts downward from 102 or 3 times. Waking 3 or 4 times before and getting up between 6 and 8 AM. ENT surgery tonsils. Remote history of asthma she says has resolved. Bilateral mastectomy after breast cancer. Fibromyalgia.  Prior to Admission medications   Medication Sig Start Date End Date Taking? Authorizing Provider  Calcium-Vitamin D-Vitamin K 500-100-40 MG-UNT-MCG CHEW Chew 1 tablet by mouth 2 (two) times daily.    Yes Historical Provider, MD  Cholecalciferol (VITAMIN D) 2000 UNITS CAPS Take 1 capsule by mouth 2 (two) times daily.   Yes Historical Provider, MD  citalopram (CELEXA) 20 MG tablet Take 1 tablet (20 mg total) by mouth daily. 10/14/14  Yes Doe-Hyun R Yoo, DO  Coenzyme Q10 (COQ10) 100 MG CAPS Take by mouth.   Yes Historical Provider, MD  Docusate Calcium (STOOL SOFTENER PO) Take by mouth. As needed   Yes Historical Provider, MD  esomeprazole (NEXIUM) 40 MG capsule Take 1 capsule (40 mg total) by mouth daily. 10/14/14  Yes Doe-Hyun Kyra Searles, DO  levothyroxine (SYNTHROID, LEVOTHROID) 88 MCG tablet Take 88 mcg by mouth daily before breakfast.   Yes Historical Provider, MD  Lutein-Zeaxanthin 25-5 MG CAPS Take by mouth.   Yes  Historical Provider, MD  meloxicam (MOBIC) 7.5 MG tablet Take 1 tablet by mouth two  times daily 05/20/15  Yes Doe-Hyun Kyra Searles, DO  omega-3 acid ethyl esters (LOVAZA) 1 G capsule Take 1 capsule by mouth  twice daily 05/20/15  Yes Doe-Hyun Kyra Searles, DO  oxyCODONE-acetaminophen (PERCOCET/ROXICET) 5-325 MG per tablet Take by mouth every 4 (four) hours as needed for severe pain.   Yes Historical Provider, MD  pregabalin (LYRICA) 75 MG capsule Take 1 capsule (75 mg total) by mouth 2 (two) times daily. 02/03/15  Yes Doe-Hyun R Shawna Orleans, DO  simvastatin (ZOCOR) 10 MG tablet Take 1 tablet (10 mg total) by mouth at bedtime. 10/14/14  Yes Doe-Hyun Kyra Searles, DO  tamoxifen (NOLVADEX) 20 MG tablet Take 1 tablet (20 mg total) by mouth daily. 05/01/15  Yes Chauncey Cruel, MD  traMADol (ULTRAM) 50 MG tablet Take 1 tablet (50 mg total) by mouth every 6 (six) hours as needed for moderate pain. 05/21/15  Yes Chauncey Cruel, MD  Wheat Dextrin (BENEFIBER PO) Take by mouth.   Yes Historical Provider, MD   Past Medical History  Diagnosis Date  . Chest pain, atypical     Negative stress test 02/2009  . Other and unspecified hyperlipidemia   . Personal history of urinary calculi   . Pain in joint, shoulder region   . Irritable bowel syndrome   . Chronic insomnia   . GERD (gastroesophageal reflux disease)   . History of colonic polyps   . Fibromyalgia   . Hypothyroidism   . Other abnormal glucose   .  Stress incontinence, female   . Macular degeneration   . Cluster headaches     history of migraines  . Meniscus tear     history of right knee torn meniscus  . Arthritis   . Kidney stone   . Cataract   . Depression   . Pneumonia 1607,3710  . Asthma     hx of -no inhalers, no problems  . H/O hiatal hernia   . Breast cancer 08/08/13    right LOQ  . Hx of radiation therapy 10/29/13- 12/14/13    right chest wall 5040 cGy 28 sessions, right supraclavicular/axillary region 5040 cGy 28 sessions, right chest wall boost 1000 cGy  5 sessions  . Psoriasis     mild   Past Surgical History  Procedure Laterality Date  . Appendectomy    . Cholecystectomy    . Abdominal hysterectomy    . Tonsillectomy    . Eye surgery      to repair macular hole  . Cataract extraction, bilateral    . Knee arthroscopy      > 10 years ago  . Joint replacement  03/15/11    left knee replacement  . Colonoscopy    . Ganglion cyst excision      rt foot  . Foot arthroplasty      lt   . Mass excision  11/04/2011    Procedure: EXCISION MASS;  Surgeon: Cammie Sickle., MD;  Location: [facility] [place];  Service: Orthopedics;  Laterality: Right;  excisional biopsy right ulna mass  . Hemorrhoid surgery      03/1993  . Breast cyst aspiration      9 cysts  . Toe surgery      preventative crossover toe surg/right foot  . Toe surgery      left foot/screw  in 2nd toe  . Upper gastrointestinal endoscopy    . Skin tags removed      breast, panty line, neckline  . Bilateral total mastectomy with axillary lymph node dissection  08/30/2013    Dr Barry Dienes  . Simple mastectomy with axillary sentinel node biopsy Left 08/30/2013    Procedure: Bilateral Breast Mastectomy ;  Surgeon: Stark Klein, MD;  Location: [place] [facility];  Service: General;  Laterality: Left;  Marland Kitchen Mastectomy w/ sentinel node biopsy Right 08/30/2013    Procedure: RIGHT  AXILLARY SENTINEL LYMPH NODE BIOPSY; Right Axillary Node Disection;  Surgeon: Stark Klein, MD;  Location: [facility];  Service: General;  Laterality: Right;  Right side nuc med 7:00   . Evacuation breast hematoma Left 08/31/2013    Procedure: EVACUATION HEMATOMA BREAST;  Surgeon: Stark Klein, MD;  [facility];  Service: General;  Laterality: Left;   Family History  Problem Relation Age of Onset  . Stroke Mother     died age 75  . Diabetes Mother   . Breast cancer Mother 37  . Breast cancer Sister 101  . Breast cancer Paternal Aunt 71  . Diabetes Maternal Grandfather   . Breast cancer Paternal Grandmother  57  . Breast cancer Paternal Aunt     dx in her 64s  . Cancer Maternal Grandmother     intra-abdominal cancer  . Brain cancer Maternal Uncle 8  . Brain cancer Cousin 75    maternal cousin  . Brain cancer Cousin 20    paternal cousin   Social History   Social History  . Marital Status: Married    Spouse Name: N/A  . Number of  Children: 3  . Years of Education: N/A   Occupational History  . retired Radiation protection practitioner    Social History Main Topics  . Smoking status: Former Smoker -- 0.10 packs/day for 2 years    Types: Cigarettes    Start date: 11/16/1959    Quit date: 11/15/1960  . Smokeless tobacco: Never Used  . Alcohol Use: No  . Drug Use: No  . Sexual Activity: Not on file     Comment: menarche age 77, fist live birth 66, P 3, hysterectomy age 37, no HRT, BCP 2 yrs   Other Topics Concern  . Not on file   Social History Narrative   Occupation:  Retired Radiation protection practitioner    Married with 3 grown children      Never Smoked     Alcohol use-no        ROS-see HPI   Negative unless "+" Constitutional:    weight loss, night sweats, fevers, chills, fatigue, lassitude. HEENT:    headaches, difficulty swallowing, +tooth/dental problems, sore throat,       sneezing, itching, ear ache, nasal congestion, post nasal drip, snoring CV:    chest pain, orthopnea, PND, swelling in lower extremities, anasarca,                                                  dizziness, palpitations Resp:   +shortness of breath with exertion or at rest.                productive cough,   non-productive cough, coughing up of blood.              change in color of mucus.  wheezing.   Skin:    rash or lesions. GI:     +heartburn, indigestion, abdominal pain, nausea, vomiting, diarrhea,                 change in bowel habits, loss of appetite GU: dysuria, change in color of urine, no urgency or frequency.   flank pain. MS:   +joint pain, stiffness, decreased range of motion, back pain. Neuro-     nothing  unusual Psych:  change in mood or affect.  depression or anxiety.   memory loss.  OBJ- Physical Exam General- Alert, Oriented, Affect-appropriate, Distress- none acute, obese Skin- rash-none, lesions- none, excoriation- none Lymphadenopathy- none Head- atraumatic            Eyes- Gross vision intact, PERRLA, conjunctivae and secretions clear            Ears- Hearing, canals-normal            Nose- Clear, no-Septal dev, mucus, polyps, erosion, perforation             Throat- Mallampati III-IV , mucosa clear , drainage- none, tonsils- atrophic Neck- flexible , trachea midline, no stridor , thyroid nl, carotid no bruit Chest - symmetrical excursion , unlabored           Heart/CV- RRR , no murmur , no gallop  , no rub, nl s1 s2                           - JVD- none , edema- none, stasis changes- none, varices- none           Lung- clear to P&A, wheeze-  none, cough- none , dullness-none, rub- none           Chest wall- + bilateral mastectomy Abd-  Br/ Gen/ Rectal- Not done, not indicated Extrem- cyanosis- none, clubbing, none, atrophy- none, strength- nl Neuro- grossly intact to observation

## 2015-06-25 NOTE — Assessment & Plan Note (Signed)
We discussed the medical significance of sleep apnea, interaction with her insomnia problem, basic sleep hygiene, treatment options. She should do well with CPAP. Plan-start CPAP with auto titration

## 2015-06-25 NOTE — Telephone Encounter (Signed)
Katie where can pt be worked in at? thanks

## 2015-06-25 NOTE — Assessment & Plan Note (Signed)
This sounds like a long-standing problem separate from any additional disturbances such as sleep apnea. She has not done as well as she would like with Restoril 15 mg. Plan-discussed basic sleep hygiene. We will reassess after her sleep study because this problem may be impacted by successful treatment for sleep apnea if appropriate.

## 2015-06-25 NOTE — Telephone Encounter (Signed)
Please have patient come in on 07-27-15 at 11:15am. Thanks .

## 2015-06-25 NOTE — Patient Instructions (Signed)
Order- New DME., new CPAP auto 5-20, mask of choice, humidifier, supplies   Dx [facility]  Please call as needed

## 2015-06-26 ENCOUNTER — Ambulatory Visit (INDEPENDENT_AMBULATORY_CARE_PROVIDER_SITE_OTHER): Payer: Medicare Other | Admitting: Podiatry

## 2015-06-26 ENCOUNTER — Encounter: Payer: Self-pay | Admitting: Podiatry

## 2015-06-26 DIAGNOSIS — B351 Tinea unguium: Secondary | ICD-10-CM

## 2015-06-26 DIAGNOSIS — M79676 Pain in unspecified toe(s): Secondary | ICD-10-CM

## 2015-06-26 NOTE — Telephone Encounter (Signed)
Sorry about that; it should have said 09-26-15 at 11:15am. Thanks.

## 2015-06-26 NOTE — Telephone Encounter (Signed)
Called spoke with pt. Scheduled appt. Nothing further needed

## 2015-06-26 NOTE — Progress Notes (Signed)
She presents today with a chief complaint of painful elongated toenails 1 through 5 bilateral. She states they're painful with shoe gear and ambulation.  Objective: Vital signs are stable she is alert and oriented 3 pulses remain palpable bilateral. Neurologic sensorium is intact. Orthopedic evaluation of his resolved joints distal to the ankle are full range of motion without crepitation. Cutaneous evaluation demonstrates thick yellow dystrophic onychomycotic nails.  Assessment: Pain and limb secondary to onychomycosis 1 through 5 bilateral.  Plan: Debridement of nails 1 through 5 bilateral is a covered service secondary to pain.

## 2015-07-04 ENCOUNTER — Other Ambulatory Visit: Payer: Self-pay | Admitting: Orthopedic Surgery

## 2015-07-04 DIAGNOSIS — M1711 Unilateral primary osteoarthritis, right knee: Secondary | ICD-10-CM

## 2015-07-04 DIAGNOSIS — M1611 Unilateral primary osteoarthritis, right hip: Secondary | ICD-10-CM

## 2015-07-04 DIAGNOSIS — M19071 Primary osteoarthritis, right ankle and foot: Secondary | ICD-10-CM

## 2015-07-11 ENCOUNTER — Ambulatory Visit
Admission: RE | Admit: 2015-07-11 | Discharge: 2015-07-11 | Disposition: A | Discharge status: Home / Self Care | Payer: Medicare Other | Source: Ambulatory Visit | Attending: Orthopedic Surgery | Admitting: Orthopedic Surgery

## 2015-07-11 ENCOUNTER — Other Ambulatory Visit: Payer: Self-pay | Admitting: Internal Medicine

## 2015-07-11 DIAGNOSIS — M1711 Unilateral primary osteoarthritis, right knee: Secondary | ICD-10-CM

## 2015-07-11 DIAGNOSIS — M19071 Primary osteoarthritis, right ankle and foot: Secondary | ICD-10-CM

## 2015-07-11 DIAGNOSIS — M1611 Unilateral primary osteoarthritis, right hip: Secondary | ICD-10-CM

## 2015-07-11 DIAGNOSIS — M179 Osteoarthritis of knee, unspecified: Secondary | ICD-10-CM | POA: Diagnosis not present

## 2015-07-11 DIAGNOSIS — Z01818 Encounter for other preprocedural examination: Secondary | ICD-10-CM | POA: Diagnosis not present

## 2015-07-12 ENCOUNTER — Encounter: Payer: Self-pay | Admitting: Internal Medicine

## 2015-07-12 DIAGNOSIS — G8929 Other chronic pain: Secondary | ICD-10-CM | POA: Diagnosis not present

## 2015-07-12 DIAGNOSIS — M79671 Pain in right foot: Secondary | ICD-10-CM | POA: Diagnosis not present

## 2015-07-12 DIAGNOSIS — M19071 Primary osteoarthritis, right ankle and foot: Secondary | ICD-10-CM | POA: Diagnosis not present

## 2015-07-14 ENCOUNTER — Other Ambulatory Visit: Payer: Self-pay | Admitting: *Deleted

## 2015-07-16 ENCOUNTER — Encounter: Payer: Self-pay | Admitting: Internal Medicine

## 2015-07-18 ENCOUNTER — Other Ambulatory Visit: Payer: Self-pay

## 2015-07-18 ENCOUNTER — Other Ambulatory Visit: Payer: Self-pay | Admitting: Internal Medicine

## 2015-07-18 DIAGNOSIS — Z01818 Encounter for other preprocedural examination: Secondary | ICD-10-CM

## 2015-07-18 NOTE — Telephone Encounter (Signed)
[facility] sent a request for tramadol dosing.  Filled in for strength of 50 mg.  Dr Jana Hakim signed and the request was faxed back to [facility].

## 2015-07-25 DIAGNOSIS — M19071 Primary osteoarthritis, right ankle and foot: Secondary | ICD-10-CM | POA: Diagnosis not present

## 2015-07-25 DIAGNOSIS — M79671 Pain in right foot: Secondary | ICD-10-CM | POA: Diagnosis not present

## 2015-07-27 ENCOUNTER — Encounter: Payer: Self-pay | Admitting: Physical Therapy

## 2015-07-29 ENCOUNTER — Other Ambulatory Visit (INDEPENDENT_AMBULATORY_CARE_PROVIDER_SITE_OTHER): Payer: Medicare Other

## 2015-07-29 DIAGNOSIS — R7309 Other abnormal glucose: Secondary | ICD-10-CM | POA: Diagnosis not present

## 2015-07-29 DIAGNOSIS — Z01818 Encounter for other preprocedural examination: Secondary | ICD-10-CM

## 2015-07-29 DIAGNOSIS — R7303 Prediabetes: Secondary | ICD-10-CM

## 2015-07-29 LAB — CBC WITH DIFFERENTIAL/PLATELET
BASOS PCT: 0.4 % (ref 0.0–3.0)
Basophils Absolute: 0 10*3/uL (ref 0.0–0.1)
EOS ABS: 0.2 10*3/uL (ref 0.0–0.7)
EOS PCT: 2.3 % (ref 0.0–5.0)
HEMATOCRIT: 44.4 % (ref 36.0–46.0)
HEMOGLOBIN: 14.7 g/dL (ref 12.0–15.0)
LYMPHS PCT: 25.7 % (ref 12.0–46.0)
Lymphs Abs: 1.9 10*3/uL (ref 0.7–4.0)
MCHC: 33.2 g/dL (ref 30.0–36.0)
MCV: 88.4 fl (ref 78.0–100.0)
MONOS PCT: 7.1 % (ref 3.0–12.0)
Monocytes Absolute: 0.5 10*3/uL (ref 0.1–1.0)
Neutro Abs: 4.8 10*3/uL (ref 1.4–7.7)
Neutrophils Relative %: 64.5 % (ref 43.0–77.0)
Platelets: 192 10*3/uL (ref 150.0–400.0)
RBC: 5.02 Mil/uL (ref 3.87–5.11)
RDW: 13.7 % (ref 11.5–15.5)
WBC: 7.4 10*3/uL (ref 4.0–10.5)

## 2015-07-29 LAB — BASIC METABOLIC PANEL
BUN: 14 mg/dL (ref 6–23)
CHLORIDE: 104 meq/L (ref 96–112)
CO2: 27 meq/L (ref 19–32)
Calcium: 9.3 mg/dL (ref 8.4–10.5)
Creatinine, Ser: 0.6 mg/dL (ref 0.40–1.20)
GFR: 104.36 mL/min (ref >60.00)
Glucose, Bld: 80 mg/dL (ref 70–99)
POTASSIUM: 4 meq/L (ref 3.5–5.1)
SODIUM: 140 meq/L (ref 135–145)

## 2015-07-29 LAB — HEMOGLOBIN A1C: Hgb A1c MFr Bld: 6.3 % (ref 4.6–6.5)

## 2015-07-30 ENCOUNTER — Other Ambulatory Visit: Payer: Medicare Other

## 2015-07-30 DIAGNOSIS — H3531 Nonexudative age-related macular degeneration: Secondary | ICD-10-CM | POA: Diagnosis not present

## 2015-07-30 DIAGNOSIS — E119 Type 2 diabetes mellitus without complications: Secondary | ICD-10-CM | POA: Diagnosis not present

## 2015-08-04 DIAGNOSIS — C50911 Malignant neoplasm of unspecified site of right female breast: Secondary | ICD-10-CM | POA: Diagnosis not present

## 2015-08-06 ENCOUNTER — Ambulatory Visit: Payer: Medicare Other | Admitting: Internal Medicine

## 2015-08-08 ENCOUNTER — Other Ambulatory Visit: Payer: Medicare Other

## 2015-08-08 ENCOUNTER — Ambulatory Visit (INDEPENDENT_AMBULATORY_CARE_PROVIDER_SITE_OTHER): Payer: Medicare Other | Admitting: Family Medicine

## 2015-08-08 ENCOUNTER — Encounter: Payer: Self-pay | Admitting: Family Medicine

## 2015-08-08 ENCOUNTER — Ambulatory Visit (INDEPENDENT_AMBULATORY_CARE_PROVIDER_SITE_OTHER)
Admission: RE | Admit: 2015-08-08 | Discharge: 2015-08-08 | Disposition: A | Discharge status: Home / Self Care | Payer: Medicare Other | Source: Ambulatory Visit | Attending: Family Medicine | Admitting: Family Medicine

## 2015-08-08 VITALS — BP 110/80 | HR 68 | Temp 98.5°F | Ht 59.5 in | Wt 196.0 lb

## 2015-08-08 DIAGNOSIS — M8580 Other specified disorders of bone density and structure, unspecified site: Secondary | ICD-10-CM | POA: Diagnosis not present

## 2015-08-08 DIAGNOSIS — G4700 Insomnia, unspecified: Secondary | ICD-10-CM | POA: Diagnosis not present

## 2015-08-08 DIAGNOSIS — Z0000 Encounter for general adult medical examination without abnormal findings: Secondary | ICD-10-CM

## 2015-08-08 DIAGNOSIS — Z780 Asymptomatic menopausal state: Secondary | ICD-10-CM

## 2015-08-08 DIAGNOSIS — F5104 Psychophysiologic insomnia: Secondary | ICD-10-CM

## 2015-08-08 DIAGNOSIS — E038 Other specified hypothyroidism: Secondary | ICD-10-CM | POA: Diagnosis not present

## 2015-08-08 DIAGNOSIS — Z23 Encounter for immunization: Secondary | ICD-10-CM | POA: Diagnosis not present

## 2015-08-08 NOTE — Progress Notes (Signed)
Subjective:    [name], female    DOB: Aug 13, 1941, 74 y.o.   MRN: [id number]  HPI Patient seen for Medicare wellness exam and medical follow-up. Immunizations up-to-date. Colonoscopy up-to-date. Multiple chronic problems including obesity, chronic insomnia, osteopenia, obstructive sleep apnea, history of right breast cancer, hyperlipidemia, prediabetes, hypothyroidism, fibromyalgia  Osteopenia. Last bone density scan january 2014. She takes regular calcium and vitamin D. Denies any recent fractures.  Chronic insomnia. She apparently has been on Restoril for quite some time. She uses CPAP regularly. She's had tremendous difficulty sleeping without temazepam symptoms -only getting about 2-3 hours sleep per night. She is requesting prior authorization for temazepam. She states she uses her CPAP consistently. She's tried Benadryl but felt "hyped up". No relief with other over-the-counter medications.  On tamoxifen. She has severe hot flashes at times.  Past Medical History  Diagnosis Date  . Chest pain, atypical     Negative stress test 02/2009  . Other and unspecified hyperlipidemia   . Personal history of urinary calculi   . Pain in joint, shoulder region   . Irritable bowel syndrome   . Chronic insomnia   . GERD (gastroesophageal reflux disease)   . History of colonic polyps   . Fibromyalgia   . Hypothyroidism   . Other abnormal glucose   . Stress incontinence, female   . Macular degeneration   . Cluster headaches     history of migraines  . Meniscus tear     history of right knee torn meniscus  . Arthritis   . Kidney stone   . Cataract   . Depression   . Pneumonia 0102,7253  . Asthma     hx of -no inhalers, no problems  . H/O hiatal hernia   . Breast cancer 08/08/13    right LOQ  . Hx of radiation therapy 10/29/13- 12/14/13    right chest wall 5040 cGy 28 sessions, right supraclavicular/axillary region 5040 cGy 28 sessions, right chest wall boost 1000  cGy 5 sessions  . Psoriasis     mild   Past Surgical History  Procedure Laterality Date  . Appendectomy    . Cholecystectomy    . Abdominal hysterectomy    . Tonsillectomy    . Eye surgery      to repair macular hole  . Cataract extraction, bilateral    . Knee arthroscopy      > 10 years ago  . Joint replacement  03/15/11    left knee replacement  . Colonoscopy    . Ganglion cyst excision      rt foot  . Foot arthroplasty      lt   . Mass excision  11/04/2011    Procedure: EXCISION MASS;  Surgeon: Cammie Sickle., MD;  Location: [facility];  Service: Orthopedics;  Laterality: Right;  excisional biopsy right ulna mass  . Hemorrhoid surgery      03/1993  . Breast cyst aspiration      9 cysts  . Toe surgery      preventative crossover toe surg/right foot  . Toe surgery      left foot/screw  in 2nd toe  . Upper gastrointestinal endoscopy    . Skin tags removed      breast, panty line, neckline  . Bilateral total mastectomy with axillary lymph node dissection  08/30/2013    Dr Barry Dienes  . Simple mastectomy with axillary sentinel node biopsy Left 08/30/2013    Procedure: Bilateral  Breast Mastectomy ;  Surgeon: Stark Klein, MD;  Location: [facility];  Service: General;  Laterality: Left;  Marland Kitchen Mastectomy w/ sentinel node biopsy Right 08/30/2013    Procedure: RIGHT  AXILLARY SENTINEL LYMPH NODE BIOPSY; Right Axillary Node Disection;  Surgeon: Stark Klein, MD;  Location: [facility];  Service: General;  Laterality: Right;  Right side nuc med 7:00   . Evacuation breast hematoma Left 08/31/2013    Procedure: EVACUATION HEMATOMA BREAST;  Surgeon: Stark Klein, MD;  Location: [facility];  Service: General;  Laterality: Left;    reports that she quit smoking about 54 years ago. Her smoking use included Cigarettes. She started smoking about 55 years ago. She has a .2 pack-year smoking history. She has never used smokeless tobacco. She reports that she does not drink alcohol or use illicit  drugs. family history includes Brain cancer (age of onset: 84) in her cousin; Brain cancer (age of onset: 65) in her cousin; Brain cancer (age of onset: 73) in her maternal uncle; Breast cancer in her paternal aunt; Breast cancer (age of onset: 60) in her paternal aunt and paternal grandmother; Breast cancer (age of onset: 26) in her sister; Breast cancer (age of onset: 71) in her mother; Cancer in her maternal grandmother; Diabetes in her maternal grandfather and mother; Stroke in her mother. No Known Allergies  1.  Risk factors based on Past Medical , Social, and Family history reviewed and as indicated above with no changes 2.  Limitations in physical activities None.  No recent falls. 3.  Depression/mood No active depression or anxiety issues 4.  Hearing No deficits. 5.  ADLs independent in all. 6.  Cognitive function (orientation to time and place, language, writing, speech,memory) no short or long term memory issues.  Language and judgement intact. 7.  Home Safety no issues 8.  Height, weight, and visual acuity.all stable. 9.  Counseling discussed importance of regular weightbearing exercise and daily calcium and vitamin D. 10. Recommendation of preventive services. Flu vaccine given 11. Labs based on risk factors recent labs including CBC, basic metabolic panel, hemoglobin A1c normal 12. Care Plan -repeat DEXA, yearly flu vaccine. 13. Other Providers Dr Magrinat-oncology  Dr Danelle Earthly  Dr Alusio-Orthopedics. 14. Written schedule of screening/prevention services given to patient.    Review of Systems  Constitutional: Negative for fever, activity change, appetite change, fatigue and unexpected weight change.  HENT: Negative for ear pain, hearing loss, sore throat and trouble swallowing.   Eyes: Negative for visual disturbance.  Respiratory: Negative for cough and shortness of breath.   Cardiovascular: Negative for chest pain and palpitations.  Gastrointestinal: Negative for  abdominal pain, diarrhea, constipation and blood in stool.  Endocrine: Negative for polydipsia and polyuria.  Genitourinary: Negative for dysuria and hematuria.  Musculoskeletal: Positive for arthralgias. Negative for myalgias and back pain.  Skin: Negative for rash.  Neurological: Negative for dizziness, syncope and headaches.  Hematological: Negative for adenopathy.  Psychiatric/Behavioral: Positive for sleep disturbance. Negative for suicidal ideas, confusion and dysphoric mood.       Objective:   Physical Exam  Constitutional: She is oriented to person, place, and time. She appears well-developed and well-nourished.  HENT:  Head: Normocephalic and atraumatic.  Right Ear: External ear normal.  Left Ear: External ear normal.  Mouth/Throat: Oropharynx is clear and moist.  Eyes: EOM are normal. Pupils are equal, round, and reactive to light.  Neck: Normal range of motion. Neck supple. No thyromegaly present.  Cardiovascular: Normal rate, regular rhythm and normal  heart sounds.  Exam reveals no gallop.   Pulmonary/Chest: Breath sounds normal. No respiratory distress. She has no wheezes. She has no rales.  Abdominal: Soft. Bowel sounds are normal. She exhibits no distension and no mass. There is no tenderness. There is no rebound and no guarding.  Musculoskeletal: Normal range of motion. She exhibits no edema.  Lymphadenopathy:    She has no cervical adenopathy.  Neurological: She is alert and oriented to person, place, and time. She displays normal reflexes. No cranial nerve deficit.  Skin: No rash noted.  Psychiatric: She has a normal mood and affect. Her behavior is normal. Judgment and thought content normal.          Assessment & Plan:  Medicare wellness visit. Flu vaccine given. Weight loss encouraged. Other immunizations up-to-date.  History of osteopenia and postmenopausal. Patient on tamoxifen therapy. Repeat bone density scan. Continue daily calcium and vitamin  D  Chronic insomnia. Patient has obstructive sleep apnea on CPAP. Will get prior authorization for Temazepam.   Hypothyroidism. Recent TSH at goal.  History of reported prediabetes. Recent blood sugars completely normal. Hemoglobin A1c 6.3%. Weight control as above.

## 2015-08-08 NOTE — Progress Notes (Signed)
Pre visit review using our clinic review tool, if applicable. No additional management support is needed unless otherwise documented below in the visit note. 

## 2015-08-11 ENCOUNTER — Encounter: Payer: Self-pay | Admitting: Family Medicine

## 2015-08-12 DIAGNOSIS — D1801 Hemangioma of skin and subcutaneous tissue: Secondary | ICD-10-CM | POA: Diagnosis not present

## 2015-08-12 DIAGNOSIS — D485 Neoplasm of uncertain behavior of skin: Secondary | ICD-10-CM | POA: Diagnosis not present

## 2015-08-12 DIAGNOSIS — D225 Melanocytic nevi of trunk: Secondary | ICD-10-CM | POA: Diagnosis not present

## 2015-08-12 DIAGNOSIS — L821 Other seborrheic keratosis: Secondary | ICD-10-CM | POA: Diagnosis not present

## 2015-08-12 NOTE — Progress Notes (Signed)
Pt was advice about the result and told her to continue with her calcium and vitamin D dayly

## 2015-08-13 ENCOUNTER — Encounter: Payer: Self-pay | Admitting: Family Medicine

## 2015-08-13 ENCOUNTER — Ambulatory Visit (INDEPENDENT_AMBULATORY_CARE_PROVIDER_SITE_OTHER): Payer: Medicare Other | Admitting: Family Medicine

## 2015-08-13 VITALS — BP 130/80 | HR 67 | Temp 97.9°F | Ht 59.5 in | Wt 194.8 lb

## 2015-08-13 DIAGNOSIS — R300 Dysuria: Secondary | ICD-10-CM

## 2015-08-13 LAB — POCT URINALYSIS DIPSTICK
BILIRUBIN UA: NEGATIVE
GLUCOSE UA: NEGATIVE
Ketones, UA: NEGATIVE
Leukocytes, UA: NEGATIVE
NITRITE UA: POSITIVE
PH UA: 7
Protein, UA: NEGATIVE
RBC UA: NEGATIVE
SPEC GRAV UA: 1.015
Urobilinogen, UA: 0.2

## 2015-08-13 MED ORDER — CIPROFLOXACIN HCL 500 MG PO TABS: 500.0000 mg | ORAL_TABLET | Freq: Two times a day (BID) | ORAL | Status: DC

## 2015-08-13 NOTE — Patient Instructions (Signed)

## 2015-08-13 NOTE — Progress Notes (Signed)
Subjective:    [name], female    DOB: 11-24-1940, 74 y.o.   MRN: [id number]  HPI Patient seen with concern for possible UTI. She has not actually had burning with urination but has noticed foul "odor "for the past few days. She has chronic urine frequency which is unchanged. She's had some nonspecific symptoms such as fatigue and headaches and states she has had similar symptoms with UTI in the past. She denies any nausea, vomiting, or flank pain.  Past Medical History  Diagnosis Date  . Chest pain, atypical     Negative stress test 02/2009  . Other and unspecified hyperlipidemia   . Personal history of urinary calculi   . Pain in joint, shoulder region   . Irritable bowel syndrome   . Chronic insomnia   . GERD (gastroesophageal reflux disease)   . History of colonic polyps   . Fibromyalgia   . Hypothyroidism   . Other abnormal glucose   . Stress incontinence, female   . Macular degeneration   . Cluster headaches     history of migraines  . Meniscus tear     history of right knee torn meniscus  . Arthritis   . Kidney stone   . Cataract   . Depression   . Pneumonia 0865,7846  . Asthma     hx of -no inhalers, no problems  . H/O hiatal hernia   . Breast cancer 08/08/13    right LOQ  . Hx of radiation therapy 10/29/13- 12/14/13    right chest wall 5040 cGy 28 sessions, right supraclavicular/axillary region 5040 cGy 28 sessions, right chest wall boost 1000 cGy 5 sessions  . Psoriasis     mild   Past Surgical History  Procedure Laterality Date  . Appendectomy    . Cholecystectomy    . Abdominal hysterectomy    . Tonsillectomy    . Eye surgery      to repair macular hole  . Cataract extraction, bilateral    . Knee arthroscopy      > 10 years ago  . Joint replacement  03/15/11    left knee replacement  . Colonoscopy    . Ganglion cyst excision      rt foot  . Foot arthroplasty      lt   . Mass excision  11/04/2011    Procedure: EXCISION MASS;   Surgeon: Cammie Sickle., MD;  Location: [facility];  Service: Orthopedics;  Laterality: Right;  excisional biopsy right ulna mass  . Hemorrhoid surgery      03/1993  . Breast cyst aspiration      9 cysts  . Toe surgery      preventative crossover toe surg/right foot  . Toe surgery      left foot/screw  in 2nd toe  . Upper gastrointestinal endoscopy    . Skin tags removed      breast, panty line, neckline  . Bilateral total mastectomy with axillary lymph node dissection  08/30/2013    Dr Barry Dienes  . Simple mastectomy with axillary sentinel node biopsy Left 08/30/2013    Procedure: Bilateral Breast Mastectomy ;  Surgeon: Stark Klein, MD;  Location: [facility];  Service: General;  Laterality: Left;  Marland Kitchen Mastectomy w/ sentinel node biopsy Right 08/30/2013    Procedure: RIGHT  AXILLARY SENTINEL LYMPH NODE BIOPSY; Right Axillary Node Disection;  Surgeon: Stark Klein, MD;  Location: [facility];  Service: General;  Laterality: Right;  Right  side nuc med 7:00   . Evacuation breast hematoma Left 08/31/2013    Procedure: EVACUATION HEMATOMA BREAST;  Surgeon: Stark Klein, MD;  Location: [facility];  Service: General;  Laterality: Left;    reports that she quit smoking about 54 years ago. Her smoking use included Cigarettes. She started smoking about 55 years ago. She has a .2 pack-year smoking history. She has never used smokeless tobacco. She reports that she does not drink alcohol or use illicit drugs. family history includes Brain cancer (age of onset: 69) in her cousin; Brain cancer (age of onset: 30) in her cousin; Brain cancer (age of onset: 30) in her maternal uncle; Breast cancer in her paternal aunt; Breast cancer (age of onset: 58) in her paternal aunt and paternal grandmother; Breast cancer (age of onset: 8) in her sister; Breast cancer (age of onset: 16) in her mother; Cancer in her maternal grandmother; Diabetes in her maternal grandfather and mother; Stroke in her mother. No Known  Allergies    Review of Systems  Constitutional: Negative for fever and chills.  Gastrointestinal: Negative for abdominal pain.  Genitourinary: Positive for dysuria and frequency. Negative for hematuria and flank pain.       Objective:   Physical Exam  Constitutional: She appears well-developed and well-nourished.  Cardiovascular: Normal rate and regular rhythm.   Pulmonary/Chest: Effort normal and breath sounds normal. No respiratory distress. She has no wheezes. She has no rales.          Assessment & Plan:  Dysuria. Urine dipstick reveals only positive nitrites. Fairly nonspecific symptoms. Urine culture sent. Start Cipro 500 mg twice a day for 5 days pending culture results.

## 2015-08-13 NOTE — Progress Notes (Signed)
Pre visit review using our clinic review tool, if applicable. No additional management support is needed unless otherwise documented below in the visit note. 

## 2015-08-15 ENCOUNTER — Encounter: Payer: Self-pay | Admitting: Family Medicine

## 2015-08-15 LAB — URINE CULTURE: Colony Count: >=100000

## 2015-08-21 ENCOUNTER — Encounter: Payer: Self-pay | Admitting: Family Medicine

## 2015-08-21 ENCOUNTER — Encounter: Payer: Self-pay | Admitting: Internal Medicine

## 2015-08-22 ENCOUNTER — Other Ambulatory Visit: Payer: Self-pay | Admitting: Family Medicine

## 2015-08-22 MED ORDER — TEMAZEPAM 15 MG PO CAPS: 15.0000 mg | ORAL_CAPSULE | Freq: Every evening | ORAL | Status: DC | PRN

## 2015-08-25 ENCOUNTER — Encounter: Payer: Self-pay | Admitting: Internal Medicine

## 2015-08-26 ENCOUNTER — Other Ambulatory Visit: Payer: Self-pay | Admitting: Internal Medicine

## 2015-08-26 ENCOUNTER — Ambulatory Visit: Payer: Medicare Other | Attending: Oncology | Admitting: Physical Therapy

## 2015-08-26 DIAGNOSIS — I972 Postmastectomy lymphedema syndrome: Secondary | ICD-10-CM | POA: Diagnosis not present

## 2015-08-26 NOTE — Therapy (Signed)
Jean Nash, [place], [address] Phone: [phone]   Fax:  [phone]  Physical Therapy Evaluation  Patient Details  Name: Jean Nash MRN: [id number] Date of Birth: Feb 05, 1941 Referring Provider:  Stark Klein, MD  Encounter Date: 08/26/2015      PT End of Session - 08/26/15 1752    Visit Number 1   Number of Visits 9   Date for PT Re-Evaluation 10/15/15   PT Start Time 1351   PT Stop Time 1435   PT Time Calculation (min) 44 min   Activity Tolerance Patient tolerated treatment well   Behavior During Therapy [facility] for tasks assessed/performed      Past Medical History  Diagnosis Date  . Chest pain, atypical     Negative stress test 02/2009  . Other and unspecified hyperlipidemia   . Personal history of urinary calculi   . Pain in joint, shoulder region   . Irritable bowel syndrome   . Chronic insomnia   . GERD (gastroesophageal reflux disease)   . History of colonic polyps   . Fibromyalgia   . Hypothyroidism   . Other abnormal glucose   . Stress incontinence, female   . Macular degeneration   . Cluster headaches     history of migraines  . Meniscus tear     history of right knee torn meniscus  . Arthritis   . Kidney stone   . Cataract   . Depression   . Pneumonia 0277,4128  . Asthma     hx of -no inhalers, no problems  . H/O hiatal hernia   . Breast cancer 08/08/13    right LOQ  . Hx of radiation therapy 10/29/13- 12/14/13    right chest wall 5040 cGy 28 sessions, right supraclavicular/axillary region 5040 cGy 28 sessions, right chest wall boost 1000 cGy 5 sessions  . Psoriasis     mild    Past Surgical History  Procedure Laterality Date  . Appendectomy    . Cholecystectomy    . Abdominal hysterectomy    . Tonsillectomy    . Eye surgery      to repair macular hole  . Cataract extraction, bilateral    . Knee arthroscopy      > 10 years ago  . Joint replacement   03/15/11    left knee replacement  . Colonoscopy    . Ganglion cyst excision      rt foot  . Foot arthroplasty      lt   . Mass excision  11/04/2011    Procedure: EXCISION MASS;  Surgeon: Cammie Sickle., MD;  Location: [facility];  Service: Orthopedics;  Laterality: Right;  excisional biopsy right ulna mass  . Hemorrhoid surgery      03/1993  . Breast cyst aspiration      9 cysts  . Toe surgery      preventative crossover toe surg/right foot  . Toe surgery      left foot/screw  in 2nd toe  . Upper gastrointestinal endoscopy    . Skin tags removed      breast, panty line, neckline  . Bilateral total mastectomy with axillary lymph node dissection  08/30/2013    Dr Barry Dienes  . Simple mastectomy with axillary sentinel node biopsy Left 08/30/2013    Procedure: Bilateral Breast Mastectomy ;  Surgeon: Stark Klein, MD;  Location: [facility];  Service: General;  Laterality: Left;  Marland Kitchen Mastectomy w/ sentinel node biopsy  Right 08/30/2013    Procedure: RIGHT  AXILLARY SENTINEL LYMPH NODE BIOPSY; Right Axillary Node Disection;  Surgeon: Stark Klein, MD;  Location: [facility];  Service: General;  Laterality: Right;  Right side nuc med 7:00   . Evacuation breast hematoma Left 08/31/2013    Procedure: EVACUATION HEMATOMA BREAST;  Surgeon: Stark Klein, MD;  Location: [facility];  Service: General;  Laterality: Left;    There were no vitals filed for this visit.  Visit Diagnosis:  Post-mastectomy lymphedema syndrome - Plan: PT plan of care cert/re-cert      Subjective Assessment - 08/26/15 1354    Subjective "I'm falling apart.  Foot arthritis bothering; is to have right partial knee replacement in December; has periodontal issues."  Arm measurements had increased.  Also is bothered by right underarm fullness.   Patient is accompained by: Family member  husband   Pertinent History Right arm swelling began around end of March 2016 for unknown reasons.  She had a bilateral mastectomy with a  right axillary node dissection removing 16 lymph nodes and 9 of those were positive.  Currently taking tamoxifen.    Patient Stated Goals reduce arm swelling   Currently in Pain? Yes   Pain Score 3    Pain Location Arm   Pain Orientation Posterior;Right;Upper   Pain Descriptors / Indicators Other (Comment)  "Not pain, jiust some sort of sensation"   Pain Type Chronic pain   Aggravating Factors  nothing   Pain Relieving Factors nothing            [facility] PT Assessment - 08/26/15 0001    Assessment   Medical Diagnosis breast cancer with bilateral mastectomy   Precautions   Precautions Other (comment)   Precaution Comments cnacer precautions   Restrictions   Weight Bearing Restrictions No   Balance Screen   Has the patient fallen in the past 6 months No   Has the patient had a decrease in activity level because of a fear of falling?  No   Is the patient reluctant to leave their home because of a fear of falling?  No   Home Social worker Private residence   Living Arrangements Spouse/significant other   Prior Function   Level of Independence Independent with basic ADLs   Cognition   Overall Cognitive Status Within Functional Limits for tasks assessed   Posture/Postural Control   Posture/Postural Control Postural limitations   Postural Limitations Rounded Shoulders;Forward head   AROM   Overall AROM  Within functional limits for tasks performed  both shoulders grossly WFL           LYMPHEDEMA/ONCOLOGY QUESTIONNAIRE - 08/26/15 1402    Lymphedema Assessments   Lymphedema Assessments --  with exhale @ low axillae 99.5 cm.; at largest point, 106 cm   Right Upper Extremity Lymphedema   15 cm Proximal to Olecranon Process 39.8 cm   10 cm Proximal to Olecranon Process 36 cm   Olecranon Process 26.3 cm   10 cm Proximal to Ulnar Styloid Process 24 cm   Just Proximal to Ulnar Styloid Process 17.3 cm   Across Hand at [employer] 18.2 cm   At [place] of 2nd  Digit 6 cm   Left Upper Extremity Lymphedema   15 cm Proximal to Olecranon Process 38.5 cm   10 cm Proximal to Olecranon Process 37.2 cm   Olecranon Process 25.5 cm   10 cm Proximal to Ulnar Styloid Process 24.2 cm   Just Proximal  to Ulnar Styloid Process 16 cm   Across Hand at [employer] 17.8 cm   At [place] of 2nd Digit 6.2 cm                [facility] Adult PT Treatment/Exercise - 08/26/15 0001    Manual Therapy   Manual Therapy Edema management;Manual Lymphatic Drainage (MLD)   Manual Lymphatic Drainage (MLD) In supine, diaphragmatic breathing, short neck, left axilla and anterior interaxillary anastomosis, right groin and axillo-inguinal anastomosis, and right UE from dorsal hand to shoulder.                   Short Term Clinic Goals - 05/15/15 1152    CC Short Term Goal  #1   Title Reduce right arm swelling at 10 cm proximal to her right olecranon process by >/= 1 cm   Time 4   Period Weeks   Status Not Met  And patient is satisfied with this outcome   CC Short Term Goal  #2   Title Report >/= 20% reduction in pain in right lateral trunk / scapular area to tolerate swimming and other daily tasks with less difficulty   Time 3   Period Weeks   Status New   CC Short Term Goal  #3   Title Patient will be able to report she able to use her right arm to perform household tasks including cooking with >/= 25% less difficulty   Time 3   Period Weeks   Status New          PT Long Term Goals - 06/09/15 1510    PT LONG TERM GOAL #1   Title Pt will be able to turn neck without pain in order to improve functional use of neck and arm.   Time 5   Period Weeks   Status Achieved   PT LONG TERM GOAL #2   Title Pt will be able to flex arm without pain level 2/10 or less   Baseline Pain 1-2/10   Time 5   Period Weeks   Status Achieved           Long Term Clinic Goals - 08/26/15 1755    CC Long Term Goal  #1   Title Patient will report at least 50%  improvement in sense of discomfort with right UE swelling.   Time 4   Period Weeks   Status New   CC Long Term Goal  #2   Title Circumference measurement at 15 cm. proximal to olecranon on right arm will reduce to 39.3 cm.   Baseline 39.8 cm. at evaluation compared to 38.5 on left   Time 4   Period Weeks   Status New            Plan - 08/26/15 1753    Clinical Impression Statement Patient who is known to this clinic comes in with c/o increased right UE swelling.  There are small differences in circumference measurements between right and left arms today, the right arm larger at most but not all six levels measrured.  She also continues to be bothered with the "dog-ear" at right lateral chest from mastectomy.  Pt will benefit from skilled therapeutic intervention in order to improve on the following deficits Increased edema   Rehab Potential Good   PT Frequency 2x / week   PT Duration 4 weeks   PT Treatment/Interventions Manual lymph drainage;Manual techniques;Patient/family education   PT Next Visit Plan Manual lymph drainage for right UE and right upper flank.   Consulted and Agree with Plan of Care Patient          G-Codes - 09/04/2015 1758    Functional Assessment Tool Used clinical judgement   Functional Limitation Self care   Self Care Current Status [phone]) At least 1 percent but less than 20 percent impaired, limited or restricted   Self Care Goal Status (R1444) At least 1 percent but less than 20 percent impaired, limited or restricted       Problem List Patient Active Problem List   Diagnosis Date Noted  . Osteopenia 08/08/2015  . Chronic insomnia 08/08/2015  . Obstructive sleep apnea 06/25/2015  . Headache 02/03/2015  . Atypical chest pain 07/15/2014  . Arthralgia 02/22/2014  . Psoriasis 02/22/2014  . Breast cancer of lower-outer quadrant of right female breast ([place]) 08/09/2013   . Neck pain 06/22/2013  . Left knee pain 11/29/2012  . Cluster headache 10/04/2012  . Gastroenteritis 04/26/2012  . Reactive depression (situational) 04/26/2012  . Right wrist pain 02/02/2012  . Right foot pain 10/04/2011  . Tinea capitis 10/04/2011  . Nevus 06/05/2011  . Status post total knee replacement 04/06/2011  . Overactive bladder 03/14/2011  . MOLE 12/21/2010  . KNEE PAIN, LEFT 08/31/2010  . HEARING LOSS 08/17/2010  . PSORIASIS 02/26/2010  . HIRSUTISM 06/16/2009  . SHOULDER PAIN, LEFT 11/21/2008  . HELICOBACTER PYLORI INFECTION, HX OF 06/18/2008  . CYST, IDIOPATHIC 05/20/2008  . FOOT PAIN 04/22/2008  . IRRITABLE BOWEL SYNDROME 04/15/2008  . Asthma 03/19/2008  . GERD 03/19/2008  . COLONIC POLYPS, HX OF 03/19/2008  . NEPHROLITHIASIS, HX OF 03/19/2008  . Hypothyroidism 03/18/2008  . Hyperlipidemia 03/18/2008  . INSOMNIA, CHRONIC 03/18/2008  . FIBROMYALGIA 03/18/2008  . Prediabetes 03/18/2008    [place]in [place] 09-04-15, 6:00 PM  [place], [place], [address] Phone: [phone]   Fax:  [place], PT 2015-09-04 6:00 PM

## 2015-08-27 MED ORDER — TEMAZEPAM 15 MG PO CAPS: 15.0000 mg | ORAL_CAPSULE | Freq: Every evening | ORAL | Status: DC | PRN

## 2015-09-03 ENCOUNTER — Ambulatory Visit: Payer: Medicare Other

## 2015-09-03 ENCOUNTER — Encounter: Payer: Self-pay | Admitting: Internal Medicine

## 2015-09-05 ENCOUNTER — Ambulatory Visit: Payer: Medicare Other | Admitting: Physical Therapy

## 2015-09-10 ENCOUNTER — Telehealth: Payer: Self-pay | Admitting: *Deleted

## 2015-09-10 ENCOUNTER — Ambulatory Visit: Payer: Medicare Other

## 2015-09-10 NOTE — Telephone Encounter (Signed)
Patient's appeal for Temazepam was approved.

## 2015-09-12 ENCOUNTER — Ambulatory Visit: Payer: Medicare Other | Admitting: Physical Therapy

## 2015-09-17 ENCOUNTER — Other Ambulatory Visit: Payer: Self-pay | Admitting: Internal Medicine

## 2015-09-18 ENCOUNTER — Other Ambulatory Visit: Payer: Self-pay | Admitting: *Deleted

## 2015-09-18 ENCOUNTER — Telehealth: Payer: Self-pay | Admitting: *Deleted

## 2015-09-18 NOTE — Telephone Encounter (Signed)
Yes ok to RF x 2

## 2015-09-18 NOTE — Telephone Encounter (Signed)
[facility] is requesting a refill of  Lyrica and Temazepam Okay to fill?

## 2015-09-19 ENCOUNTER — Ambulatory Visit: Payer: Medicare Other | Attending: Oncology

## 2015-09-19 DIAGNOSIS — Z9013 Acquired absence of bilateral breasts and nipples: Secondary | ICD-10-CM | POA: Insufficient documentation

## 2015-09-19 DIAGNOSIS — I972 Postmastectomy lymphedema syndrome: Secondary | ICD-10-CM

## 2015-09-19 DIAGNOSIS — C50511 Malignant neoplasm of lower-outer quadrant of right female breast: Secondary | ICD-10-CM | POA: Insufficient documentation

## 2015-09-19 DIAGNOSIS — I890 Lymphedema, not elsewhere classified: Secondary | ICD-10-CM

## 2015-09-19 DIAGNOSIS — R0789 Other chest pain: Secondary | ICD-10-CM | POA: Diagnosis not present

## 2015-09-19 DIAGNOSIS — M25519 Pain in unspecified shoulder: Secondary | ICD-10-CM | POA: Insufficient documentation

## 2015-09-19 MED ORDER — PREGABALIN 75 MG PO CAPS: 75.0000 mg | ORAL_CAPSULE | Freq: Two times a day (BID) | ORAL | Status: DC

## 2015-09-19 MED ORDER — TEMAZEPAM 15 MG PO CAPS: 15.0000 mg | ORAL_CAPSULE | Freq: Every evening | ORAL | Status: DC | PRN

## 2015-09-19 NOTE — Telephone Encounter (Signed)
Hard copy faxed. 

## 2015-09-19 NOTE — Therapy (Signed)
[place], [place], [address] Phone: [phone]   Fax:  [phone]  Physical Therapy Treatment  Patient Details  Name: Jean Nash MRN: [id number] Date of Birth: 1941-07-18 No Data Recorded  Encounter Date: 09/19/2015      PT End of Session - 09/19/15 1204    Visit Number 2   Number of Visits 9   Date for PT Re-Evaluation 10/15/15   PT Start Time 1115   PT Stop Time 1157   PT Time Calculation (min) 42 min   Activity Tolerance Patient tolerated treatment well   Behavior During Therapy [facility] for tasks assessed/performed      Past Medical History  Diagnosis Date  . Chest pain, atypical     Negative stress test 02/2009  . Other and unspecified hyperlipidemia   . Personal history of urinary calculi   . Pain in joint, shoulder region   . Irritable bowel syndrome   . Chronic insomnia   . GERD (gastroesophageal reflux disease)   . History of colonic polyps   . Fibromyalgia   . Hypothyroidism   . Other abnormal glucose   . Stress incontinence, female   . Macular degeneration   . Cluster headaches     history of migraines  . Meniscus tear     history of right knee torn meniscus  . Arthritis   . Kidney stone   . Cataract   . Depression   . Pneumonia 2094,7096  . Asthma     hx of -no inhalers, no problems  . H/O hiatal hernia   . Breast cancer 08/08/13    right LOQ  . Hx of radiation therapy 10/29/13- 12/14/13    right chest wall 5040 cGy 28 sessions, right supraclavicular/axillary region 5040 cGy 28 sessions, right chest wall boost 1000 cGy 5 sessions  . Psoriasis     mild    Past Surgical History  Procedure Laterality Date  . Appendectomy    . Cholecystectomy    . Abdominal hysterectomy    . Tonsillectomy    . Eye surgery      to repair macular hole  . Cataract extraction, bilateral    . Knee arthroscopy      > 10 years ago  . Joint replacement  03/15/11    left knee  replacement  . Colonoscopy    . Ganglion cyst excision      rt foot  . Foot arthroplasty      lt   . Mass excision  11/04/2011    Procedure: EXCISION MASS;  Surgeon: Cammie Sickle., MD;  Location: [place];  Service: Orthopedics;  Laterality: Right;  excisional biopsy right ulna mass  . Hemorrhoid surgery      03/1993  . Breast cyst aspiration      9 cysts  . Toe surgery      preventative crossover toe surg/right foot  . Toe surgery      left foot/screw  in 2nd toe  . Upper gastrointestinal endoscopy    . Skin tags removed      breast, panty line, neckline  . Bilateral total mastectomy with axillary lymph node dissection  08/30/2013    Dr Barry Dienes  . Simple mastectomy with axillary sentinel node biopsy Left 08/30/2013    Procedure: Bilateral Breast Mastectomy ;  Surgeon: Stark Klein, MD;  Location: [place];  Service: General;  Laterality: Left;  Marland Kitchen Mastectomy w/ sentinel node biopsy Right 08/30/2013  Procedure: RIGHT  AXILLARY SENTINEL LYMPH NODE BIOPSY; Right Axillary Node Disection;  Surgeon: Stark Klein, MD;  Location: [place];  Service: General;  Laterality: Right;  Right side nuc med 7:00   . Evacuation breast hematoma Left 08/31/2013    Procedure: EVACUATION HEMATOMA BREAST;  Surgeon: Stark Klein, MD;  Location: [place];  Service: General;  Laterality: Left;    There were no vitals filed for this visit.  Visit Diagnosis:  Post-mastectomy lymphedema syndrome  Pain in joint, shoulder region, unspecified laterality  Tightness in chest  S/P bilateral mastectomy  Lymphedema  Breast cancer of lower-outer quadrant of right female breast ([place])      Subjective Assessment - 09/19/15 1120    Subjective I've started taking my sleeping med again (Restoril). My chest tightness has been okay since I've been here. Having oral surgery Monday due to a root dying and so the tooth has to be pulled.   Currently in Pain? No/denies                          [facility] Adult PT Treatment/Exercise - 09/19/15 0001    Manual Therapy   Myofascial Release To bil chest wall horizontally, and Rt chest wall horizontally and vertically and UE pulling throughout PROM, also diaganolly with D2 streching   Manual Lymphatic Drainage (MLD) In supine, diaphragmatic breathing, short neck, left axilla and anterior interaxillary anastomosis, right groin and axillo-inguinal anastomosis, and right UE from dorsal hand to shoulder.   Passive ROM To Rt shoulder in supine for effective myofascial release to Rt chest wall into flexion, abduction and D2 all to pts tolerance.                   Short Term Clinic Goals - 09/19/15 1207    CC Short Term Goal  #2   Title Report >/= 20% reduction in pain in right lateral trunk / scapular area to tolerate swimming and other daily tasks with less difficulty  Though did not give % pt did report that she noticed an improvement in pain here   Status On-going   CC Short Term Goal  #3   Title Patient will be able to report she able to use her right arm to perform household tasks including cooking with >/= 25% less difficulty   Status On-going          PT Long Term Goals - 06/09/15 1510    PT LONG TERM GOAL #1   Title Pt will be able to turn neck without pain in order to improve functional use of neck and arm.   Time 5   Period Weeks   Status Achieved   PT LONG TERM GOAL #2   Title Pt will be able to flex arm without pain level 2/10 or less   Baseline Pain 1-2/10   Time 5   Period Weeks   Status Achieved           Long Term Clinic Goals - 08/26/15 1755    CC Long Term Goal  #1   Title Patient will report at least 50% improvement in sense of discomfort with right UE swelling.   Time 4   Period Weeks   Status New   CC Long Term Goal  #2   Title Circumference measurement at 15 cm. proximal to olecranon on right arm will reduce to 39.3 cm.   Baseline 39.8 cm. at evaluation  compared to 38.5 on left  Time 4   Period Weeks   Status New            Plan - 09/19/15 1204    Clinical Impression Statement Therapist forgot to measure circumference today. Pt with some moderate tenderness to touch at Rt chest wall superior aspect to incision. She was able to tolerate some good, effective stretching and myofascial release though for the duration of treatment today.    Pt will benefit from skilled therapeutic intervention in order to improve on the following deficits Increased edema   Rehab Potential Good   Clinical Impairments Affecting Rehab Potential Chronic pain   PT Frequency 2x / week   PT Duration 4 weeks   PT Treatment/Interventions Manual lymph drainage;Manual techniques;Patient/family education   PT Next Visit Plan Remeasure circumference for goal assess; Manual lymph drainage for right UE and right upper flank; cont manual techniques prn to chest wall    Consulted and Agree with Plan of Care Patient        Problem List Patient Active Problem List   Diagnosis Date Noted  . Osteopenia 08/08/2015  . Chronic insomnia 08/08/2015  . Obstructive sleep apnea 06/25/2015  . Headache 02/03/2015  . Atypical chest pain 07/15/2014  . Arthralgia 02/22/2014  . Psoriasis 02/22/2014  . Breast cancer of lower-outer quadrant of right female breast ([place]) 08/09/2013  . Neck pain 06/22/2013  . Left knee pain 11/29/2012  . Cluster headache 10/04/2012  . Gastroenteritis 04/26/2012  . Reactive depression (situational) 04/26/2012  . Right wrist pain 02/02/2012  . Right foot pain 10/04/2011  . Tinea capitis 10/04/2011  . Nevus 06/05/2011  . Status post total knee replacement 04/06/2011  . Overactive bladder 03/14/2011  . MOLE 12/21/2010  . KNEE PAIN, LEFT 08/31/2010  . HEARING LOSS 08/17/2010  . PSORIASIS 02/26/2010  . HIRSUTISM 06/16/2009  . SHOULDER PAIN, LEFT 11/21/2008  . HELICOBACTER PYLORI INFECTION, HX OF 06/18/2008  . CYST, IDIOPATHIC 05/20/2008  .  FOOT PAIN 04/22/2008  . IRRITABLE BOWEL SYNDROME 04/15/2008  . Asthma 03/19/2008  . GERD 03/19/2008  . COLONIC POLYPS, HX OF 03/19/2008  . NEPHROLITHIASIS, HX OF 03/19/2008  . Hypothyroidism 03/18/2008  . Hyperlipidemia 03/18/2008  . INSOMNIA, CHRONIC 03/18/2008  . FIBROMYALGIA 03/18/2008  . Prediabetes 03/18/2008    Otelia Limes, PTA 09/19/2015, 12:09 PM  [place], [place], [address] Phone: [phone]   Fax:  [phone]  Name: Malinda Mayden MRN: [id number] Date of Birth: [birth year]

## 2015-09-24 ENCOUNTER — Ambulatory Visit: Payer: Medicare Other

## 2015-09-24 DIAGNOSIS — M25519 Pain in unspecified shoulder: Secondary | ICD-10-CM | POA: Diagnosis not present

## 2015-09-24 DIAGNOSIS — I972 Postmastectomy lymphedema syndrome: Secondary | ICD-10-CM | POA: Diagnosis not present

## 2015-09-24 DIAGNOSIS — Z9013 Acquired absence of bilateral breasts and nipples: Secondary | ICD-10-CM

## 2015-09-24 DIAGNOSIS — R0789 Other chest pain: Secondary | ICD-10-CM | POA: Diagnosis not present

## 2015-09-24 DIAGNOSIS — I890 Lymphedema, not elsewhere classified: Secondary | ICD-10-CM | POA: Diagnosis not present

## 2015-09-24 DIAGNOSIS — C50511 Malignant neoplasm of lower-outer quadrant of right female breast: Secondary | ICD-10-CM | POA: Diagnosis not present

## 2015-09-24 NOTE — Therapy (Signed)
[place], [place], [address] Phone: [phone]   Fax:  [phone]  Physical Therapy Treatment  Patient Details  Name: Jean Nash MRN: [id number] Date of Birth: [birth year] No Data Recorded  Encounter Date: 09/24/2015      PT End of Session - 09/24/15 1208    Visit Number 3   Number of Visits 9   Date for PT Re-Evaluation 10/15/15   PT Start Time 1108   PT Stop Time 1152   PT Time Calculation (min) 44 min   Activity Tolerance Patient tolerated treatment well   Behavior During Therapy [facility] for tasks assessed/performed      Past Medical History  Diagnosis Date  . Chest pain, atypical     Negative stress test 02/2009  . Other and unspecified hyperlipidemia   . Personal history of urinary calculi   . Pain in joint, shoulder region   . Irritable bowel syndrome   . Chronic insomnia   . GERD (gastroesophageal reflux disease)   . History of colonic polyps   . Fibromyalgia   . Hypothyroidism   . Other abnormal glucose   . Stress incontinence, female   . Macular degeneration   . Cluster headaches     history of migraines  . Meniscus tear     history of right knee torn meniscus  . Arthritis   . Kidney stone   . Cataract   . Depression   . Pneumonia 4650,3546  . Asthma     hx of -no inhalers, no problems  . H/O hiatal hernia   . Breast cancer 08/08/13    right LOQ  . Hx of radiation therapy 10/29/13- 12/14/13    right chest wall 5040 cGy 28 sessions, right supraclavicular/axillary region 5040 cGy 28 sessions, right chest wall boost 1000 cGy 5 sessions  . Psoriasis     mild    Past Surgical History  Procedure Laterality Date  . Appendectomy    . Cholecystectomy    . Abdominal hysterectomy    . Tonsillectomy    . Eye surgery      to repair macular hole  . Cataract extraction, bilateral    . Knee arthroscopy      > 10 years ago  . Joint replacement  03/15/11    left knee  replacement  . Colonoscopy    . Ganglion cyst excision      rt foot  . Foot arthroplasty      lt   . Mass excision  11/04/2011    Procedure: EXCISION MASS;  Surgeon: Cammie Sickle., MD;  Location: [place];  Service: Orthopedics;  Laterality: Right;  excisional biopsy right ulna mass  . Hemorrhoid surgery      03/1993  . Breast cyst aspiration      9 cysts  . Toe surgery      preventative crossover toe surg/right foot  . Toe surgery      left foot/screw  in 2nd toe  . Upper gastrointestinal endoscopy    . Skin tags removed      breast, panty line, neckline  . Bilateral total mastectomy with axillary lymph node dissection  08/30/2013    Dr Barry Dienes  . Simple mastectomy with axillary sentinel node biopsy Left 08/30/2013    Procedure: Bilateral Breast Mastectomy ;  Surgeon: Stark Klein, MD;  Location: [place];  Service: General;  Laterality: Left;  Marland Kitchen Mastectomy w/ sentinel node biopsy Right 08/30/2013  Procedure: RIGHT  AXILLARY SENTINEL LYMPH NODE BIOPSY; Right Axillary Node Disection;  Surgeon: Stark Klein, MD;  Location: [place];  Service: General;  Laterality: Right;  Right side nuc med 7:00   . Evacuation breast hematoma Left 08/31/2013    Procedure: EVACUATION HEMATOMA BREAST;  Surgeon: Stark Klein, MD;  Location: [place];  Service: General;  Laterality: Left;    There were no vitals filed for this visit.  Visit Diagnosis:  Post-mastectomy lymphedema syndrome  Tightness in chest  S/P bilateral mastectomy  Lymphedema      Subjective Assessment - 09/24/15 1114    Subjective I had oral surgery Monday and they did a bone graft and removed a tooth. I'm on Ampicillin but don't know the dosage for just a few more days.    Currently in Pain? No/denies               LYMPHEDEMA/ONCOLOGY QUESTIONNAIRE - 09/24/15 1148    Right Upper Extremity Lymphedema   15 cm Proximal to Olecranon Process 38.4 cm   10 cm Proximal to Olecranon Process 37.8 cm    Olecranon Process 26 cm   10 cm Proximal to Ulnar Styloid Process 23.5 cm   Just Proximal to Ulnar Styloid Process 17.2 cm   Across Hand at [employer] 17.6 cm   At [place] of 2nd Digit 6 cm                  [facility] Adult PT Treatment/Exercise - 09/24/15 0001    Manual Therapy   Myofascial Release --   Manual Lymphatic Drainage (MLD) In supine, diaphragmatic breathing, short neck, left axilla and anterior interaxillary anastomosis, right groin and axillo-inguinal anastomosis, and right UE from dorsal hand to shoulder.   Passive ROM --                   Short Term Clinic Goals - 09/24/15 1211    CC Short Term Goal  #1   Title Reduce right arm swelling at 10 cm proximal to her right olecranon process by >/= 1 cm  This was increased today but pt reports wearing her sleeve less in last few days due to not feeling well from her oral surgery Monday.   CC Short Term Goal  #2   Title Report >/= 20% reduction in pain in right lateral trunk / scapular area to tolerate swimming and other daily tasks with less difficulty  Pt with increased pain today that she reports has been worse since her oral surgery on Monday.   Status On-going   CC Short Term Goal  #3   Title Patient will be able to report she able to use her right arm to perform household tasks including cooking with >/= 25% less difficulty   Status On-going          PT Long Term Goals - 06/09/15 1510    PT LONG TERM GOAL #1   Title Pt will be able to turn neck without pain in order to improve functional use of neck and arm.   Time 5   Period Weeks   Status Achieved   PT LONG TERM GOAL #2   Title Pt will be able to flex arm without pain level 2/10 or less   Baseline Pain 1-2/10   Time 5   Period Weeks   Status Achieved           Long Term Clinic Goals - 09/24/15 1212    CC Long Term  Goal  #1   Title Patient will report at least 50% improvement in sense of discomfort with right UE swelling.   Status  On-going   CC Long Term Goal  #2   Title Circumference measurement at 15 cm. proximal to olecranon on right arm will reduce to 39.3 cm.  38.3 cm attained 09/24/15   Status Achieved            Plan - 09/24/15 1208    Clinical Impression Statement Pt with some reductions of lower arm but increase at 1 of her upper arm measurements. Limited treatment today to only manual lymph drainage as pt is till feeling poorly from oral surgery Monday and reports she's been experiencing increased tenderness at her chest since then.    Pt will benefit from skilled therapeutic intervention in order to improve on the following deficits Increased edema   Rehab Potential Good   Clinical Impairments Affecting Rehab Potential Chronic pain   PT Frequency 2x / week   PT Duration 4 weeks   PT Treatment/Interventions Manual lymph drainage;Manual techniques;Patient/family education   PT Next Visit Plan Remeasure circumference for goal assess; Manual lymph drainage for right UE and right upper flank; cont manual techniques prn to chest wall    Consulted and Agree with Plan of Care Patient        Problem List Patient Active Problem List   Diagnosis Date Noted  . Osteopenia 08/08/2015  . Chronic insomnia 08/08/2015  . Obstructive sleep apnea 06/25/2015  . Headache 02/03/2015  . Atypical chest pain 07/15/2014  . Arthralgia 02/22/2014  . Psoriasis 02/22/2014  . Breast cancer of lower-outer quadrant of right female breast ([place]) 08/09/2013  . Neck pain 06/22/2013  . Left knee pain 11/29/2012  . Cluster headache 10/04/2012  . Gastroenteritis 04/26/2012  . Reactive depression (situational) 04/26/2012  . Right wrist pain 02/02/2012  . Right foot pain 10/04/2011  . Tinea capitis 10/04/2011  . Nevus 06/05/2011  . Status post total knee replacement 04/06/2011  . Overactive bladder 03/14/2011  . MOLE 12/21/2010  . KNEE PAIN, LEFT 08/31/2010  . HEARING LOSS 08/17/2010  . PSORIASIS 02/26/2010  . HIRSUTISM  06/16/2009  . SHOULDER PAIN, LEFT 11/21/2008  . HELICOBACTER PYLORI INFECTION, HX OF 06/18/2008  . CYST, IDIOPATHIC 05/20/2008  . FOOT PAIN 04/22/2008  . IRRITABLE BOWEL SYNDROME 04/15/2008  . Asthma 03/19/2008  . GERD 03/19/2008  . COLONIC POLYPS, HX OF 03/19/2008  . NEPHROLITHIASIS, HX OF 03/19/2008  . Hypothyroidism 03/18/2008  . Hyperlipidemia 03/18/2008  . INSOMNIA, CHRONIC 03/18/2008  . FIBROMYALGIA 03/18/2008  . Prediabetes 03/18/2008    Otelia Limes, PTA 09/24/2015, 12:14 PM  [place], [place], [address] Phone: [phone]   Fax:  [phone]  Name: Jean Nash MRN: [id number] Date of Birth: 05-09-1941

## 2015-09-25 ENCOUNTER — Ambulatory Visit: Payer: Medicare Other | Admitting: Podiatry

## 2015-09-26 ENCOUNTER — Encounter: Payer: Self-pay | Admitting: Internal Medicine

## 2015-09-26 ENCOUNTER — Other Ambulatory Visit (INDEPENDENT_AMBULATORY_CARE_PROVIDER_SITE_OTHER): Payer: Medicare Other

## 2015-09-26 ENCOUNTER — Ambulatory Visit: Payer: Medicare Other | Admitting: Physical Therapy

## 2015-09-26 ENCOUNTER — Ambulatory Visit (INDEPENDENT_AMBULATORY_CARE_PROVIDER_SITE_OTHER): Payer: Medicare Other | Admitting: Internal Medicine

## 2015-09-26 VITALS — BP 110/62 | HR 66 | Ht 59.0 in | Wt 196.2 lb

## 2015-09-26 DIAGNOSIS — G4733 Obstructive sleep apnea (adult) (pediatric): Secondary | ICD-10-CM | POA: Diagnosis not present

## 2015-09-26 DIAGNOSIS — G4700 Insomnia, unspecified: Secondary | ICD-10-CM | POA: Diagnosis not present

## 2015-09-26 DIAGNOSIS — R399 Unspecified symptoms and signs involving the genitourinary system: Secondary | ICD-10-CM | POA: Diagnosis not present

## 2015-09-26 DIAGNOSIS — R3989 Other symptoms and signs involving the genitourinary system: Secondary | ICD-10-CM | POA: Diagnosis not present

## 2015-09-26 LAB — URINALYSIS
Bilirubin Urine: NEGATIVE
HGB URINE DIPSTICK: NEGATIVE
Ketones, ur: NEGATIVE
Leukocytes, UA: NEGATIVE
NITRITE: NEGATIVE
Specific Gravity, Urine: 1.025 (ref 1.000–1.030)
TOTAL PROTEIN, URINE-UPE24: NEGATIVE
Urine Glucose: NEGATIVE
Urobilinogen, UA: 0.2 (ref 0.0–1.0)
pH: 6 (ref 5.0–8.0)

## 2015-09-26 NOTE — Patient Instructions (Addendum)
Order- DME [facility]    Change CPAP range to 8-15   Dx OSA

## 2015-09-26 NOTE — Assessment & Plan Note (Signed)
Discussed basis for her restlessness at night

## 2015-09-26 NOTE — Assessment & Plan Note (Signed)
Good start with CPAP. We will narrow the auto pressure range Little to see if that is more comfortable Plan-change pressure to 8-15

## 2015-09-26 NOTE — Progress Notes (Signed)
06/25/15- 36 yoF negligible smoker referred courtesy of Dr Chaney Born.Had home sleep study-printed and attached to consult sheet. Unattended Home Sleep Test 03/06/2015- severe obstructive sleep apnea/hypopnea syndrome, AHI 44.9 per hour with desaturation to 74% and mean saturation only 89% on room air. Body weight 190 pounds   [place] 4/24 Medical problems including hypothyroidism, chronic insomnia, asthma, GERD, history breast cancer R, Husband here   Daughter is pediatrician in [place] She gives long history as a short sleeper averaging 3-5 hours per night with difficulty initiating and maintaining sleep on a chronic basis. Currently using Restoril 15 mg with a little benefit. Bedtime between 11 and midnight estimating 15 minutes sleep latency during which time she counts downward from 102 or 3 times. Waking 3 or 4 times before and getting up between 6 and 8 AM. ENT surgery tonsils. Remote history of asthma she says has resolved. Bilateral mastectomy after breast cancer. Fibromyalgia.  09/26/15- 74 year old female nonsmoker followed for obstructive sleep apnea CPAP auto started 06/25/15/              husband here FOLLOWS FOR: DME: [facility]; set up with CPAP machine since last OV-DL from [employer] printed.  Download confirms good compliance with CPAP 5-20 auto/[facility]. She complains of restlessness at night, waking frequently. Her Fit bit tells her she gets 3 or 4 hours of sleep Tries Restoril but it doesn't sustain sleep  ROS-see HPI   Negative unless "+" Constitutional:    weight loss, night sweats, fevers, chills, fatigue, lassitude. HEENT:    headaches, difficulty swallowing, +tooth/dental problems, sore throat,       sneezing, itching, ear ache, nasal congestion, post nasal drip, snoring CV:    chest pain, orthopnea, PND, swelling in lower extremities, anasarca,                                                  dizziness, palpitations Resp:   +shortness of breath with exertion or at rest.              productive cough,   non-productive cough, coughing up of blood.              change in color of mucus.  wheezing.   Skin:    rash or lesions. GI:     +heartburn, indigestion, abdominal pain, nausea, vomiting, diarrhea,                 change in bowel habits, loss of appetite GU: dysuria, change in color of urine, no urgency or frequency.   flank pain. MS:   +joint pain, stiffness, decreased range of motion, back pain. Neuro-     nothing unusual Psych:  change in mood or affect.  depression or anxiety.   memory loss.  OBJ- Physical Exam General- Alert, Oriented, Affect-appropriate, Distress- none acute, + obese Skin- rash-none, lesions- none, excoriation- none Lymphadenopathy- none Head- atraumatic            Eyes- Gross vision intact, PERRLA, conjunctivae and secretions clear            Ears- Hearing, canals-normal            Nose- Clear, no-Septal dev, mucus, polyps, erosion, perforation             Throat- Mallampati III-IV , mucosa clear , drainage- none, tonsils- atrophic Neck- flexible , trachea midline,  no stridor , thyroid nl, carotid no bruit Chest - symmetrical excursion , unlabored           Heart/CV- RRR , no murmur , no gallop  , no rub, nl s1 s2                           - JVD- none , edema- none, stasis changes- none, varices- none           Lung- clear to P&A, wheeze- none, cough- none , dullness-none, rub- none           Chest wall- + bilateral mastectomy Abd-  Br/ Gen/ Rectal- Not done, not indicated Extrem- cyanosis- none, clubbing, none, atrophy- none, strength- nl Neuro- grossly intact to observation

## 2015-09-27 LAB — URINE CULTURE

## 2015-09-29 ENCOUNTER — Ambulatory Visit: Payer: Medicare Other

## 2015-09-29 DIAGNOSIS — I972 Postmastectomy lymphedema syndrome: Secondary | ICD-10-CM

## 2015-09-29 DIAGNOSIS — Z9013 Acquired absence of bilateral breasts and nipples: Secondary | ICD-10-CM

## 2015-09-29 DIAGNOSIS — C50511 Malignant neoplasm of lower-outer quadrant of right female breast: Secondary | ICD-10-CM | POA: Diagnosis not present

## 2015-09-29 DIAGNOSIS — M25519 Pain in unspecified shoulder: Secondary | ICD-10-CM | POA: Diagnosis not present

## 2015-09-29 DIAGNOSIS — R0789 Other chest pain: Secondary | ICD-10-CM | POA: Diagnosis not present

## 2015-09-29 DIAGNOSIS — I890 Lymphedema, not elsewhere classified: Secondary | ICD-10-CM

## 2015-09-29 NOTE — Therapy (Signed)
[place], [place], [address] Phone: [phone]   Fax:  [phone]  Physical Therapy Treatment  Patient Details  Name: Jean Nash MRN: [id number] Date of Birth: 03/03/1941 No Data Recorded  Encounter Date: 09/29/2015      PT End of Session - 09/29/15 1107    Visit Number 4   Number of Visits 9   Date for PT Re-Evaluation 10/15/15   PT Start Time 1013   PT Stop Time 1102   PT Time Calculation (min) 49 min   Activity Tolerance Patient tolerated treatment well   Behavior During Therapy [facility] for tasks assessed/performed      Past Medical History  Diagnosis Date  . Chest pain, atypical     Negative stress test 02/2009  . Other and unspecified hyperlipidemia   . Personal history of urinary calculi   . Pain in joint, shoulder region   . Irritable bowel syndrome   . Chronic insomnia   . GERD (gastroesophageal reflux disease)   . History of colonic polyps   . Fibromyalgia   . Hypothyroidism   . Other abnormal glucose   . Stress incontinence, female   . Macular degeneration   . Cluster headaches     history of migraines  . Meniscus tear     history of right knee torn meniscus  . Arthritis   . Kidney stone   . Cataract   . Depression   . Pneumonia [id number]  . Asthma     hx of -no inhalers, no problems  . H/O hiatal hernia   . Breast cancer ([place]) 08/08/13    right LOQ  . Hx of radiation therapy 10/29/13- 12/14/13    right chest wall 5040 cGy 28 sessions, right supraclavicular/axillary region 5040 cGy 28 sessions, right chest wall boost 1000 cGy 5 sessions  . Psoriasis     mild    Past Surgical History  Procedure Laterality Date  . Appendectomy    . Cholecystectomy    . Abdominal hysterectomy    . Tonsillectomy    . Eye surgery      to repair macular hole  . Cataract extraction, bilateral    . Knee arthroscopy      > 10 years ago  . Joint replacement  03/15/11    left knee  replacement  . Colonoscopy    . Ganglion cyst excision      rt foot  . Foot arthroplasty      lt   . Mass excision  11/04/2011    Procedure: EXCISION MASS;  Surgeon: Cammie Sickle., MD;  Location: [place];  Service: Orthopedics;  Laterality: Right;  excisional biopsy right ulna mass  . Hemorrhoid surgery      03/1993  . Breast cyst aspiration      9 cysts  . Toe surgery      preventative crossover toe surg/right foot  . Toe surgery      left foot/screw  in 2nd toe  . Upper gastrointestinal endoscopy    . Skin tags removed      breast, panty line, neckline  . Bilateral total mastectomy with axillary lymph node dissection  08/30/2013    Dr Barry Dienes  . Simple mastectomy with axillary sentinel node biopsy Left 08/30/2013    Procedure: Bilateral Breast Mastectomy ;  Surgeon: Stark Klein, MD;  Location: [place];  Service: General;  Laterality: Left;  Marland Kitchen Mastectomy w/ sentinel node biopsy Right 08/30/2013  Procedure: RIGHT  AXILLARY SENTINEL LYMPH NODE BIOPSY; Right Axillary Node Disection;  Surgeon: Stark Klein, MD;  Location: [place];  Service: General;  Laterality: Right;  Right side nuc med 7:00   . Evacuation breast hematoma Left 08/31/2013    Procedure: EVACUATION HEMATOMA BREAST;  Surgeon: Stark Klein, MD;  Location: [place];  Service: General;  Laterality: Left;    There were no vitals filed for this visit.  Visit Diagnosis:  Post-mastectomy lymphedema syndrome  Tightness in chest  S/P bilateral mastectomy  Lymphedema      Subjective Assessment - 09/29/15 1018    Subjective Feeling better today from my oral surgery last week. Finish my antibiotic today. My Rt chest tightness feels okay today.                          [facility] Adult PT Treatment/Exercise - 09/29/15 0001    Manual Therapy   Myofascial Release To Rt chest wall horizontally and vertically   Manual Lymphatic Drainage (MLD) In supine, diaphragmatic breathing, short neck,  left axilla and anterior interaxillary anastomosis, right groin and axillo-inguinal anastomosis, and right UE from dorsal hand to shoulder.   Passive ROM To Rt shoulder in supine for effective myofascial release to Rt chest wall into flexion, abduction and D2 all to pts tolerance.                   Short Term Clinic Goals - 09/24/15 1211    CC Short Term Goal  #1   Title Reduce right arm swelling at 10 cm proximal to her right olecranon process by >/= 1 cm  This was increased today but pt reports wearing her sleeve less in last few days due to not feeling well from her oral surgery Monday.   CC Short Term Goal  #2   Title Report >/= 20% reduction in pain in right lateral trunk / scapular area to tolerate swimming and other daily tasks with less difficulty  Pt with increased pain today that she reports has been worse since her oral surgery on Monday.   Status On-going   CC Short Term Goal  #3   Title Patient will be able to report she able to use her right arm to perform household tasks including cooking with >/= 25% less difficulty   Status On-going          PT Long Term Goals - 06/09/15 1510    PT LONG TERM GOAL #1   Title Pt will be able to turn neck without pain in order to improve functional use of neck and arm.   Time 5   Period Weeks   Status Achieved   PT LONG TERM GOAL #2   Title Pt will be able to flex arm without pain level 2/10 or less   Baseline Pain 1-2/10   Time 5   Period Weeks   Status Achieved           Long Term Clinic Goals - 09/24/15 1212    CC Long Term Goal  #1   Title Patient will report at least 50% improvement in sense of discomfort with right UE swelling.   Status On-going   CC Long Term Goal  #2   Title Circumference measurement at 15 cm. proximal to olecranon on right arm will reduce to 39.3 cm.  38.3 cm attained 09/24/15   Status Achieved            Plan -  09/29/15 1107    Clinical Impression Statement Though pt did have  some c/o pain throughout treatment today at Rt chest wall it did not limit treatment. She reports these pains can be very intermittent, but intenstiy is still as bad when they happen. She did seem to have less pain at end ROM stretching at chest wall with myofascial release today.    Pt will benefit from skilled therapeutic intervention in order to improve on the following deficits Increased edema   Rehab Potential Good   Clinical Impairments Affecting Rehab Potential Chronic pain   PT Frequency 2x / week   PT Duration 4 weeks   PT Treatment/Interventions Manual lymph drainage;Manual techniques;Patient/family education   PT Next Visit Plan Remeasure circumference for goal assess; Manual lymph drainage for right UE and right upper flank; cont manual techniques prn to chest wall    Consulted and Agree with Plan of Care Patient        Problem List Patient Active Problem List   Diagnosis Date Noted  . Osteopenia 08/08/2015  . Chronic insomnia 08/08/2015  . Obstructive sleep apnea 06/25/2015  . Headache 02/03/2015  . Atypical chest pain 07/15/2014  . Arthralgia 02/22/2014  . Psoriasis 02/22/2014  . Breast cancer of lower-outer quadrant of right female breast ([place]) 08/09/2013  . Neck pain 06/22/2013  . Left knee pain 11/29/2012  . Cluster headache 10/04/2012  . Gastroenteritis 04/26/2012  . Reactive depression (situational) 04/26/2012  . Right wrist pain 02/02/2012  . Right foot pain 10/04/2011  . Tinea capitis 10/04/2011  . Nevus 06/05/2011  . Status post total knee replacement 04/06/2011  . Overactive bladder 03/14/2011  . MOLE 12/21/2010  . KNEE PAIN, LEFT 08/31/2010  . HEARING LOSS 08/17/2010  . PSORIASIS 02/26/2010  . HIRSUTISM 06/16/2009  . SHOULDER PAIN, LEFT 11/21/2008  . HELICOBACTER PYLORI INFECTION, HX OF 06/18/2008  . CYST, IDIOPATHIC 05/20/2008  . FOOT PAIN 04/22/2008  . IRRITABLE BOWEL SYNDROME 04/15/2008  . Asthma 03/19/2008  . GERD 03/19/2008  . COLONIC  POLYPS, HX OF 03/19/2008  . NEPHROLITHIASIS, HX OF 03/19/2008  . Hypothyroidism 03/18/2008  . Hyperlipidemia 03/18/2008  . INSOMNIA, CHRONIC 03/18/2008  . FIBROMYALGIA 03/18/2008  . Prediabetes 03/18/2008    Otelia Limes, PTA 09/29/2015, 11:10 AM  [place], [place], [address] Phone: [phone]   Fax:  [phone]  Name: Jean Nash MRN: [id number] Date of Birth: Mar 28, 1941

## 2015-09-30 ENCOUNTER — Ambulatory Visit: Payer: Medicare Other | Admitting: Podiatry

## 2015-10-01 ENCOUNTER — Ambulatory Visit: Payer: Medicare Other

## 2015-10-01 DIAGNOSIS — I972 Postmastectomy lymphedema syndrome: Secondary | ICD-10-CM | POA: Diagnosis not present

## 2015-10-01 DIAGNOSIS — M25519 Pain in unspecified shoulder: Secondary | ICD-10-CM | POA: Diagnosis not present

## 2015-10-01 DIAGNOSIS — R0789 Other chest pain: Secondary | ICD-10-CM

## 2015-10-01 DIAGNOSIS — Z9013 Acquired absence of bilateral breasts and nipples: Secondary | ICD-10-CM | POA: Diagnosis not present

## 2015-10-01 DIAGNOSIS — I890 Lymphedema, not elsewhere classified: Secondary | ICD-10-CM | POA: Diagnosis not present

## 2015-10-01 DIAGNOSIS — C50511 Malignant neoplasm of lower-outer quadrant of right female breast: Secondary | ICD-10-CM | POA: Diagnosis not present

## 2015-10-01 NOTE — Therapy (Signed)
[place], [place], [address] Phone: [phone]   Fax:  [phone]  Physical Therapy Treatment  Patient Details  Name: Jean Nash MRN: [id number] Date of Birth: 19-Jan-1941 No Data Recorded  Encounter Date: 10/01/2015      PT End of Session - 10/01/15 1222    Visit Number 5   Number of Visits 9   Date for PT Re-Evaluation 10/15/15   PT Start Time [id number]   PT Stop Time 1102   PT Time Calculation (min) 47 min   Activity Tolerance Patient tolerated treatment well   Behavior During Therapy [facility] for tasks assessed/performed      Past Medical History  Diagnosis Date  . Chest pain, atypical     Negative stress test 02/2009  . Other and unspecified hyperlipidemia   . Personal history of urinary calculi   . Pain in joint, shoulder region   . Irritable bowel syndrome   . Chronic insomnia   . GERD (gastroesophageal reflux disease)   . History of colonic polyps   . Fibromyalgia   . Hypothyroidism   . Other abnormal glucose   . Stress incontinence, female   . Macular degeneration   . Cluster headaches     history of migraines  . Meniscus tear     history of right knee torn meniscus  . Arthritis   . Kidney stone   . Cataract   . Depression   . Pneumonia [id number]  . Asthma     hx of -no inhalers, no problems  . H/O hiatal hernia   . Breast cancer ([place]) 08/08/13    right LOQ  . Hx of radiation therapy 10/29/13- 12/14/13    right chest wall 5040 cGy 28 sessions, right supraclavicular/axillary region 5040 cGy 28 sessions, right chest wall boost 1000 cGy 5 sessions  . Psoriasis     mild    Past Surgical History  Procedure Laterality Date  . Appendectomy    . Cholecystectomy    . Abdominal hysterectomy    . Tonsillectomy    . Eye surgery      to repair macular hole  . Cataract extraction, bilateral    . Knee arthroscopy      > 10 years ago  . Joint replacement  03/15/11    left knee  replacement  . Colonoscopy    . Ganglion cyst excision      rt foot  . Foot arthroplasty      lt   . Mass excision  11/04/2011    Procedure: EXCISION MASS;  Surgeon: Cammie Sickle., MD;  Location: [place];  Service: Orthopedics;  Laterality: Right;  excisional biopsy right ulna mass  . Hemorrhoid surgery      03/1993  . Breast cyst aspiration      9 cysts  . Toe surgery      preventative crossover toe surg/right foot  . Toe surgery      left foot/screw  in 2nd toe  . Upper gastrointestinal endoscopy    . Skin tags removed      breast, panty line, neckline  . Bilateral total mastectomy with axillary lymph node dissection  08/30/2013    Dr Barry Dienes  . Simple mastectomy with axillary sentinel node biopsy Left 08/30/2013    Procedure: Bilateral Breast Mastectomy ;  Surgeon: Stark Klein, MD;  Location: [place];  Service: General;  Laterality: Left;  Marland Kitchen Mastectomy w/ sentinel node biopsy Right 08/30/2013  Procedure: RIGHT  AXILLARY SENTINEL LYMPH NODE BIOPSY; Right Axillary Node Disection;  Surgeon: Stark Klein, MD;  Location: [place];  Service: General;  Laterality: Right;  Right side nuc med 7:00   . Evacuation breast hematoma Left 08/31/2013    Procedure: EVACUATION HEMATOMA BREAST;  Surgeon: Stark Klein, MD;  Location: [place];  Service: General;  Laterality: Left;    There were no vitals filed for this visit.  Visit Diagnosis:  Tightness in chest  Lymphedema  Post-mastectomy lymphedema syndrome      Subjective Assessment - 10/01/15 1019    Subjective My Rt arm looked a little swollen yesterday and my upper arm right above my elbow was hurting, but not so much today. My mouth is bothering me some from the surgery last week.    Currently in Pain? No/denies               LYMPHEDEMA/ONCOLOGY QUESTIONNAIRE - 10/01/15 1026    Right Upper Extremity Lymphedema   15 cm Proximal to Olecranon Process 38.5 cm   10 cm Proximal to Olecranon Process 35.2 cm    Olecranon Process 25.2 cm   10 cm Proximal to Ulnar Styloid Process 23 cm   Just Proximal to Ulnar Styloid Process 16.6 cm   Across Hand at [employer] 17.6 cm   At [place] of 2nd Digit 6 cm                  [facility] Adult PT Treatment/Exercise - 10/01/15 0001    Manual Therapy   Myofascial Release To Rt chest wall horizontally and vertically   Manual Lymphatic Drainage (MLD) In supine, diaphragmatic breathing, short neck, left axilla and anterior interaxillary anastomosis, right groin and axillo-inguinal anastomosis, and right UE from dorsal hand to shoulder.   Passive ROM To Rt shoulder in supine for effective myofascial release to Rt chest wall into flexion, abduction and D2 all to pts tolerance.                   Short Term Clinic Goals - 10/01/15 1021    CC Short Term Goal  #2   Title Report >/= 20% reduction in pain in right lateral trunk / scapular area to tolerate swimming and other daily tasks with less difficulty  Pt reports 25% reduction in pain   Status Achieved   CC Short Term Goal  #3   Title Patient will be able to report she able to use her right arm to perform household tasks including cooking with >/= 25% less difficulty  25% reduction reported   Status Achieved          PT Long Term Goals - 06/09/15 1510    PT LONG TERM GOAL #1   Title Pt will be able to turn neck without pain in order to improve functional use of neck and arm.   Time 5   Period Weeks   Status Achieved   PT LONG TERM GOAL #2   Title Pt will be able to flex arm without pain level 2/10 or less   Baseline Pain 1-2/10   Time 5   Period Weeks   Status Achieved           Long Term Clinic Goals - 10/01/15 1024    CC Long Term Goal  #1   Title Patient will report at least 50% improvement in sense of discomfort with right UE swelling.  25% reduction reported at this time 10/01/15   Status On-going  CC Long Term Goal  #2   Title Circumference measurement at 15 cm.  proximal to olecranon on right arm will reduce to 39.3 cm.  38.5 cm reduction attained today 10/01/15   Status Achieved   CC Long Term Goal  #3   Title report overall pain decreased >/= 50 percent to tolerate daily tasks with less pain  25% reduction reported at this time 10/01/15   Status On-going            Plan - 10/01/15 1224    Clinical Impression Statement Pts circumference measurements reduced well from last time measured and she reports pain overall decrease of 25% since start of care. Pt progressing well with therapy.   Pt will benefit from skilled therapeutic intervention in order to improve on the following deficits Increased edema   Rehab Potential Good   Clinical Impairments Affecting Rehab Potential Chronic pain   PT Frequency 2x / week   PT Duration 4 weeks   PT Next Visit Plan Remeasure circumference for goal assess; Manual lymph drainage for right UE and right upper flank; cont manual techniques prn to chest wall    Consulted and Agree with Plan of Care Patient        Problem List Patient Active Problem List   Diagnosis Date Noted  . Osteopenia 08/08/2015  . Chronic insomnia 08/08/2015  . Obstructive sleep apnea 06/25/2015  . Headache 02/03/2015  . Atypical chest pain 07/15/2014  . Arthralgia 02/22/2014  . Psoriasis 02/22/2014  . Breast cancer of lower-outer quadrant of right female breast ([place]) 08/09/2013  . Neck pain 06/22/2013  . Left knee pain 11/29/2012  . Cluster headache 10/04/2012  . Gastroenteritis 04/26/2012  . Reactive depression (situational) 04/26/2012  . Right wrist pain 02/02/2012  . Right foot pain 10/04/2011  . Tinea capitis 10/04/2011  . Nevus 06/05/2011  . Status post total knee replacement 04/06/2011  . Overactive bladder 03/14/2011  . MOLE 12/21/2010  . KNEE PAIN, LEFT 08/31/2010  . HEARING LOSS 08/17/2010  . PSORIASIS 02/26/2010  . HIRSUTISM 06/16/2009  . SHOULDER PAIN, LEFT 11/21/2008  . HELICOBACTER PYLORI INFECTION, HX  OF 06/18/2008  . CYST, IDIOPATHIC 05/20/2008  . FOOT PAIN 04/22/2008  . IRRITABLE BOWEL SYNDROME 04/15/2008  . Asthma 03/19/2008  . GERD 03/19/2008  . COLONIC POLYPS, HX OF 03/19/2008  . NEPHROLITHIASIS, HX OF 03/19/2008  . Hypothyroidism 03/18/2008  . Hyperlipidemia 03/18/2008  . INSOMNIA, CHRONIC 03/18/2008  . FIBROMYALGIA 03/18/2008  . Prediabetes 03/18/2008    Otelia Limes, PTA 10/01/2015, 12:30 PM  [place], [place], [address] Phone: [phone]   Fax:  [phone]  Name: Jean Nash MRN: [id number] Date of Birth: 24-Jul-1941

## 2015-10-06 ENCOUNTER — Ambulatory Visit: Payer: Medicare Other

## 2015-10-06 DIAGNOSIS — I890 Lymphedema, not elsewhere classified: Secondary | ICD-10-CM | POA: Diagnosis not present

## 2015-10-06 DIAGNOSIS — R0789 Other chest pain: Secondary | ICD-10-CM | POA: Diagnosis not present

## 2015-10-06 DIAGNOSIS — Z9013 Acquired absence of bilateral breasts and nipples: Secondary | ICD-10-CM | POA: Diagnosis not present

## 2015-10-06 DIAGNOSIS — C50511 Malignant neoplasm of lower-outer quadrant of right female breast: Secondary | ICD-10-CM | POA: Diagnosis not present

## 2015-10-06 DIAGNOSIS — M25519 Pain in unspecified shoulder: Secondary | ICD-10-CM | POA: Diagnosis not present

## 2015-10-06 DIAGNOSIS — I972 Postmastectomy lymphedema syndrome: Secondary | ICD-10-CM

## 2015-10-06 NOTE — Therapy (Signed)
[place], [place], [address] Phone: [phone]   Fax:  [phone]  Physical Therapy Treatment  Patient Details  Name: Jean Nash MRN: [id number] Date of Birth: 09-19-1941 No Data Recorded  Encounter Date: 10/06/2015      PT End of Session - 10/06/15 1104    Visit Number 6   Number of Visits 9   Date for PT Re-Evaluation 10/15/15   PT Start Time 1022   PT Stop Time 1103   PT Time Calculation (min) 41 min   Activity Tolerance Patient tolerated treatment well   Behavior During Therapy [facility] for tasks assessed/performed      Past Medical History  Diagnosis Date  . Chest pain, atypical     Negative stress test 02/2009  . Other and unspecified hyperlipidemia   . Personal history of urinary calculi   . Pain in joint, shoulder region   . Irritable bowel syndrome   . Chronic insomnia   . GERD (gastroesophageal reflux disease)   . History of colonic polyps   . Fibromyalgia   . Hypothyroidism   . Other abnormal glucose   . Stress incontinence, female   . Macular degeneration   . Cluster headaches     history of migraines  . Meniscus tear     history of right knee torn meniscus  . Arthritis   . Kidney stone   . Cataract   . Depression   . Pneumonia [id number]  . Asthma     hx of -no inhalers, no problems  . H/O hiatal hernia   . Breast cancer ([place]) 08/08/13    right LOQ  . Hx of radiation therapy 10/29/13- 12/14/13    right chest wall 5040 cGy 28 sessions, right supraclavicular/axillary region 5040 cGy 28 sessions, right chest wall boost 1000 cGy 5 sessions  . Psoriasis     mild    Past Surgical History  Procedure Laterality Date  . Appendectomy    . Cholecystectomy    . Abdominal hysterectomy    . Tonsillectomy    . Eye surgery      to repair macular hole  . Cataract extraction, bilateral    . Knee arthroscopy      > 10 years ago  . Joint replacement  03/15/11    left knee  replacement  . Colonoscopy    . Ganglion cyst excision      rt foot  . Foot arthroplasty      lt   . Mass excision  11/04/2011    Procedure: EXCISION MASS;  Surgeon: Cammie Sickle., MD;  Location: [place];  Service: Orthopedics;  Laterality: Right;  excisional biopsy right ulna mass  . Hemorrhoid surgery      03/1993  . Breast cyst aspiration      9 cysts  . Toe surgery      preventative crossover toe surg/right foot  . Toe surgery      left foot/screw  in 2nd toe  . Upper gastrointestinal endoscopy    . Skin tags removed      breast, panty line, neckline  . Bilateral total mastectomy with axillary lymph node dissection  08/30/2013    Dr Barry Dienes  . Simple mastectomy with axillary sentinel node biopsy Left 08/30/2013    Procedure: Bilateral Breast Mastectomy ;  Surgeon: Stark Klein, MD;  Location: [place];  Service: General;  Laterality: Left;  Marland Kitchen Mastectomy w/ sentinel node biopsy Right 08/30/2013  Procedure: RIGHT  AXILLARY SENTINEL LYMPH NODE BIOPSY; Right Axillary Node Disection;  Surgeon: Stark Klein, MD;  Location: [place];  Service: General;  Laterality: Right;  Right side nuc med 7:00   . Evacuation breast hematoma Left 08/31/2013    Procedure: EVACUATION HEMATOMA BREAST;  Surgeon: Stark Klein, MD;  Location: [place];  Service: General;  Laterality: Left;    There were no vitals filed for this visit.  Visit Diagnosis:  Lymphedema  Post-mastectomy lymphedema syndrome  Tightness in chest      Subjective Assessment - 10/06/15 1027    Subjective Feel like my "pouch" at the end if my incision was more swollen yesterday and I had some accompanying painat my Rt trunk and anterior chest wall, but it feels fine now.    Currently in Pain? No/denies                         [facility] Adult PT Treatment/Exercise - 10/06/15 0001    Manual Therapy   Myofascial Release To Rt chest wall horizontally and vertically   Manual Lymphatic Drainage (MLD)  In supine, diaphragmatic breathing, short neck, left axilla and anterior interaxillary anastomosis, right groin and axillo-inguinal anastomosis, and right UE from dorsal hand to shoulder focusing at upper medial arm over "pouch" of fluid redirecting towards lateral upper arm then back to anastomsis.                   Short Term Clinic Goals - 10/01/15 1021    CC Short Term Goal  #2   Title Report >/= 20% reduction in pain in right lateral trunk / scapular area to tolerate swimming and other daily tasks with less difficulty  Pt reports 25% reduction in pain   Status Achieved   CC Short Term Goal  #3   Title Patient will be able to report she able to use her right arm to perform household tasks including cooking with >/= 25% less difficulty  25% reduction reported   Status Achieved          PT Long Term Goals - 06/09/15 1510    PT LONG TERM GOAL #1   Title Pt will be able to turn neck without pain in order to improve functional use of neck and arm.   Time 5   Period Weeks   Status Achieved   PT LONG TERM GOAL #2   Title Pt will be able to flex arm without pain level 2/10 or less   Baseline Pain 1-2/10   Time 5   Period Weeks   Status Achieved           Long Term Clinic Goals - 10/01/15 1024    CC Long Term Goal  #1   Title Patient will report at least 50% improvement in sense of discomfort with right UE swelling.  25% reduction reported at this time 10/01/15   Status On-going   CC Long Term Goal  #2   Title Circumference measurement at 15 cm. proximal to olecranon on right arm will reduce to 39.3 cm.  38.5 cm reduction attained today 10/01/15   Status Achieved   CC Long Term Goal  #3   Title report overall pain decreased >/= 50 percent to tolerate daily tasks with less pain  25% reduction reported at this time 10/01/15   Status On-going            Plan - 10/06/15 1104    Clinical Impression  Statement Pt reported some relief from treatment today at  area of swelling. She has a pre op appt today for her upcoming Rt knee surgery on December 19.    Pt will benefit from skilled therapeutic intervention in order to improve on the following deficits Increased edema   Clinical Impairments Affecting Rehab Potential Chronic pain   PT Frequency 2x / week   PT Duration 4 weeks   PT Treatment/Interventions Manual lymph drainage;Manual techniques;Patient/family education   PT Next Visit Plan Remeasure circumference for goal assess; Manual lymph drainage for right UE and right upper flank; cont manual techniques prn to chest wall ; kinesiotape as this was beneficial to pt last episode of care   Consulted and Agree with Plan of Care Patient        Problem List Patient Active Problem List   Diagnosis Date Noted  . Osteopenia 08/08/2015  . Chronic insomnia 08/08/2015  . Obstructive sleep apnea 06/25/2015  . Headache 02/03/2015  . Atypical chest pain 07/15/2014  . Arthralgia 02/22/2014  . Psoriasis 02/22/2014  . Breast cancer of lower-outer quadrant of right female breast ([place]) 08/09/2013  . Neck pain 06/22/2013  . Left knee pain 11/29/2012  . Cluster headache 10/04/2012  . Gastroenteritis 04/26/2012  . Reactive depression (situational) 04/26/2012  . Right wrist pain 02/02/2012  . Right foot pain 10/04/2011  . Tinea capitis 10/04/2011  . Nevus 06/05/2011  . Status post total knee replacement 04/06/2011  . Overactive bladder 03/14/2011  . MOLE 12/21/2010  . KNEE PAIN, LEFT 08/31/2010  . HEARING LOSS 08/17/2010  . PSORIASIS 02/26/2010  . HIRSUTISM 06/16/2009  . SHOULDER PAIN, LEFT 11/21/2008  . HELICOBACTER PYLORI INFECTION, HX OF 06/18/2008  . CYST, IDIOPATHIC 05/20/2008  . FOOT PAIN 04/22/2008  . IRRITABLE BOWEL SYNDROME 04/15/2008  . Asthma 03/19/2008  . GERD 03/19/2008  . COLONIC POLYPS, HX OF 03/19/2008  . NEPHROLITHIASIS, HX OF 03/19/2008  . Hypothyroidism 03/18/2008  . Hyperlipidemia 03/18/2008  . INSOMNIA, CHRONIC  03/18/2008  . FIBROMYALGIA 03/18/2008  . Prediabetes 03/18/2008    Otelia Limes, PTA 10/06/2015, 11:43 AM  [place], [place], [address] Phone: [phone]   Fax:  [phone]  Name: Levana Agreda MRN: [id number] Date of Birth: Jan 21, 1941

## 2015-10-07 ENCOUNTER — Ambulatory Visit: Payer: Self-pay | Admitting: Orthopedic Surgery

## 2015-10-07 NOTE — Progress Notes (Signed)
Preoperative surgical orders have been place into the Epic hospital system for Jean Nash on 10/07/2015, 5:38 PM  by Mickel Crow for surgery on 11-03-15.  Preop Uni Knee orders including Experal, IV Tylenol, and IV Decadron as long as there are no contraindications to the above medications. Arlee Muslim, PA-C

## 2015-10-08 ENCOUNTER — Encounter: Payer: Self-pay | Admitting: Internal Medicine

## 2015-10-08 ENCOUNTER — Ambulatory Visit: Payer: Medicare Other

## 2015-10-08 DIAGNOSIS — M25519 Pain in unspecified shoulder: Secondary | ICD-10-CM

## 2015-10-08 DIAGNOSIS — Z9013 Acquired absence of bilateral breasts and nipples: Secondary | ICD-10-CM | POA: Diagnosis not present

## 2015-10-08 DIAGNOSIS — I890 Lymphedema, not elsewhere classified: Secondary | ICD-10-CM | POA: Diagnosis not present

## 2015-10-08 DIAGNOSIS — R0789 Other chest pain: Secondary | ICD-10-CM

## 2015-10-08 DIAGNOSIS — I972 Postmastectomy lymphedema syndrome: Secondary | ICD-10-CM

## 2015-10-08 DIAGNOSIS — C50511 Malignant neoplasm of lower-outer quadrant of right female breast: Secondary | ICD-10-CM | POA: Diagnosis not present

## 2015-10-08 NOTE — Therapy (Signed)
[place], [place], [address] Phone: [phone]   Fax:  [phone]  Physical Therapy Treatment  Patient Details  Name: Jean Nash MRN: [id number] Date of Birth: 1941/11/11 No Data Recorded  Encounter Date: 10/08/2015      PT End of Session - 10/08/15 1039    Visit Number 7   Number of Visits 9   Date for PT Re-Evaluation 10/15/15   PT Start Time 1017   PT Stop Time 1100   PT Time Calculation (min) 43 min   Activity Tolerance Patient tolerated treatment well   Behavior During Therapy [facility] for tasks assessed/performed      Past Medical History  Diagnosis Date  . Chest pain, atypical     Negative stress test 02/2009  . Other and unspecified hyperlipidemia   . Personal history of urinary calculi   . Pain in joint, shoulder region   . Irritable bowel syndrome   . Chronic insomnia   . GERD (gastroesophageal reflux disease)   . History of colonic polyps   . Fibromyalgia   . Hypothyroidism   . Other abnormal glucose   . Stress incontinence, female   . Macular degeneration   . Cluster headaches     history of migraines  . Meniscus tear     history of right knee torn meniscus  . Arthritis   . Kidney stone   . Cataract   . Depression   . Pneumonia 5498,2641  . Asthma     hx of -no inhalers, no problems  . H/O hiatal hernia   . Breast cancer ([place]) 08/08/13    right LOQ  . Hx of radiation therapy 10/29/13- 12/14/13    right chest wall 5040 cGy 28 sessions, right supraclavicular/axillary region 5040 cGy 28 sessions, right chest wall boost 1000 cGy 5 sessions  . Psoriasis     mild    Past Surgical History  Procedure Laterality Date  . Appendectomy    . Cholecystectomy    . Abdominal hysterectomy    . Tonsillectomy    . Eye surgery      to repair macular hole  . Cataract extraction, bilateral    . Knee arthroscopy      > 10 years ago  . Joint replacement  03/15/11    left knee  replacement  . Colonoscopy    . Ganglion cyst excision      rt foot  . Foot arthroplasty      lt   . Mass excision  11/04/2011    Procedure: EXCISION MASS;  Surgeon: Cammie Sickle., MD;  Location: [place];  Service: Orthopedics;  Laterality: Right;  excisional biopsy right ulna mass  . Hemorrhoid surgery      03/1993  . Breast cyst aspiration      9 cysts  . Toe surgery      preventative crossover toe surg/right foot  . Toe surgery      left foot/screw  in 2nd toe  . Upper gastrointestinal endoscopy    . Skin tags removed      breast, panty line, neckline  . Bilateral total mastectomy with axillary lymph node dissection  08/30/2013    Dr Barry Dienes  . Simple mastectomy with axillary sentinel node biopsy Left 08/30/2013    Procedure: Bilateral Breast Mastectomy ;  Surgeon: Stark Klein, MD;  Location: [place];  Service: General;  Laterality: Left;  Marland Kitchen Mastectomy w/ sentinel node biopsy Right 08/30/2013  Procedure: RIGHT  AXILLARY SENTINEL LYMPH NODE BIOPSY; Right Axillary Node Disection;  Surgeon: Stark Klein, MD;  Location: [place];  Service: General;  Laterality: Right;  Right side nuc med 7:00   . Evacuation breast hematoma Left 08/31/2013    Procedure: EVACUATION HEMATOMA BREAST;  Surgeon: Stark Klein, MD;  Location: [place];  Service: General;  Laterality: Left;    There were no vitals filed for this visit.  Visit Diagnosis:  Lymphedema  Post-mastectomy lymphedema syndrome  S/P bilateral mastectomy  Tightness in chest  Pain in joint, shoulder region, unspecified laterality      Subjective Assessment - 10/08/15 1021    Subjective I feel like I'm hurting all over today. Myhips and legs are really hurting.    Currently in Pain? Yes   Pain Score 4    Pain Location Hip  hips and legs   Pain Descriptors / Indicators Aching;Tiring   Pain Type Chronic pain   Pain Onset More than a month ago   Pain Frequency Intermittent   Aggravating Factors  not sure    Pain Relieving Factors don't know, it just hurts!               LYMPHEDEMA/ONCOLOGY QUESTIONNAIRE - 10/08/15 1044    Right Upper Extremity Lymphedema   15 cm Proximal to Olecranon Process 38.9 cm   10 cm Proximal to Olecranon Process 35.4 cm   Olecranon Process 25.6 cm   10 cm Proximal to Ulnar Styloid Process 23.3 cm   Just Proximal to Ulnar Styloid Process 16.6 cm   Across Hand at [employer] 17.5 cm   At [place] of 2nd Digit 5.9 cm                  [facility] Adult PT Treatment/Exercise - 10/08/15 0001    Shoulder Exercises: Supine   Horizontal ABduction Strengthening;Both;10 reps;Theraband   Theraband Level (Shoulder Horizontal ABduction) Level 2 (Red)   External Rotation Strengthening;Both;10 reps;Theraband   Theraband Level (Shoulder External Rotation) Level 2 (Red)   Flexion Strengthening;Both;10 reps;Theraband   Theraband Level (Shoulder Flexion) Level 2 (Red)   Other Supine Exercises D2 with bil UE's, red theraband, 10 reps each   Manual Therapy   Myofascial Release To Rt chest wall horizontally and vertically   Manual Lymphatic Drainage (MLD) In supine, diaphragmatic breathing, short neck, left axilla and anterior interaxillary anastomosis, right groin and axillo-inguinal anastomosis, and right UE from dorsal hand to shoulder focusing at upper medial arm over "pouch" of fluid redirecting towards lateral upper arm then back to anastomsis.                PT Education - 10/08/15 1032    Education provided Yes   Education Details Supine scapular stab series   Person(s) Educated Patient   Methods Explanation;Demonstration;Handout   Comprehension Verbalized understanding           Short Term Clinic Goals - 10/01/15 1021    CC Short Term Goal  #2   Title Report >/= 20% reduction in pain in right lateral trunk / scapular area to tolerate swimming and other daily tasks with less difficulty  Pt reports 25% reduction in pain   Status Achieved   CC  Short Term Goal  #3   Title Patient will be able to report she able to use her right arm to perform household tasks including cooking with >/= 25% less difficulty  25% reduction reported   Status Achieved  PT Long Term Goals - 06/09/15 1510    PT LONG TERM GOAL #1   Title Pt will be able to turn neck without pain in order to improve functional use of neck and arm.   Time 5   Period Weeks   Status Achieved   PT LONG TERM GOAL #2   Title Pt will be able to flex arm without pain level 2/10 or less   Baseline Pain 1-2/10   Time 5   Period Weeks   Status Achieved           Long Term Clinic Goals - 10/08/15 1025    CC Long Term Goal  #1   Title Patient will report at least 50% improvement in sense of discomfort with right UE swelling.  50% reported at this time 10/08/15   Status Achieved   CC Long Term Goal  #3   Title report overall pain decreased >/= 50 percent to tolerate daily tasks with less pain  50% reported at this time 10/08/15   CC Long Term Goal  #4   Title pt will be independent in slowly progressive strength program to help with lymphedema management and general function   Status On-going            Plan - 10/08/15 1039    Clinical Impression Statement Pt did very well with strength progression in supine today. She reports can do these due to that they are in laying and she doesnt hurt when she's laying down. 2 goals met today, see goals.    Pt will benefit from skilled therapeutic intervention in order to improve on the following deficits Increased edema   Rehab Potential Good   Clinical Impairments Affecting Rehab Potential Chronic pain   PT Frequency 2x / week   PT Duration 4 weeks   PT Treatment/Interventions Manual lymph drainage;Manual techniques;Patient/family education   PT Next Visit Plan  Manual lymph drainage for right UE and right upper flank; cont manual techniques prn to chest wall ; review/progress HEP   Consulted and Agree with Plan  of Care Patient        Problem List Patient Active Problem List   Diagnosis Date Noted  . Osteopenia 08/08/2015  . Chronic insomnia 08/08/2015  . Obstructive sleep apnea 06/25/2015  . Headache 02/03/2015  . Atypical chest pain 07/15/2014  . Arthralgia 02/22/2014  . Psoriasis 02/22/2014  . Breast cancer of lower-outer quadrant of right female breast ([place]) 08/09/2013  . Neck pain 06/22/2013  . Left knee pain 11/29/2012  . Cluster headache 10/04/2012  . Gastroenteritis 04/26/2012  . Reactive depression (situational) 04/26/2012  . Right wrist pain 02/02/2012  . Right foot pain 10/04/2011  . Tinea capitis 10/04/2011  . Nevus 06/05/2011  . Status post total knee replacement 04/06/2011  . Overactive bladder 03/14/2011  . MOLE 12/21/2010  . KNEE PAIN, LEFT 08/31/2010  . HEARING LOSS 08/17/2010  . PSORIASIS 02/26/2010  . HIRSUTISM 06/16/2009  . SHOULDER PAIN, LEFT 11/21/2008  . HELICOBACTER PYLORI INFECTION, HX OF 06/18/2008  . CYST, IDIOPATHIC 05/20/2008  . FOOT PAIN 04/22/2008  . IRRITABLE BOWEL SYNDROME 04/15/2008  . Asthma 03/19/2008  . GERD 03/19/2008  . COLONIC POLYPS, HX OF 03/19/2008  . NEPHROLITHIASIS, HX OF 03/19/2008  . Hypothyroidism 03/18/2008  . Hyperlipidemia 03/18/2008  . INSOMNIA, CHRONIC 03/18/2008  . FIBROMYALGIA 03/18/2008  . Prediabetes 03/18/2008    Otelia Limes, PTA 10/08/2015, 11:12 AM  [place],  [place], [address] Phone: [phone]   Fax:  [phone]  Name: Jean Nash MRN: [id number] Date of Birth: 01/11/1941

## 2015-10-08 NOTE — Patient Instructions (Signed)
Over Head Pull: Narrow Grip     Cancer Rehab [phone]   On back, knees bent, feet flat, band across thighs, elbows straight but relaxed. Pull hands apart (start). Keeping elbows straight, bring arms up and over head, hands toward floor. Keep pull steady on band. Hold momentarily. Return slowly, keeping pull steady, back to start. Repeat _10__ times. Band color __red____   Side Pull: Double Arm   On back, knees bent, feet flat. Arms perpendicular to body, shoulder level, elbows straight but relaxed. Pull arms out to sides, elbows straight. Resistance band comes across collarbones, hands toward floor. Hold momentarily. Slowly return to starting position. Repeat _10__ times. Band color _red____   Elmer Picker   On back, knees bent, feet flat, left hand on left hip, right hand above left. Pull right arm DIAGONALLY (hip to shoulder) across chest. Bring right arm along head toward floor. Hold momentarily. Slowly return to starting position. Repeat _10__ times. Do with left arm. Band color _red_____   Shoulder Rotation: Double Arm   On back, knees bent, feet flat, elbows tucked at sides, bent 90, hands palms up. Pull hands apart and down toward floor, keeping elbows near sides. Hold momentarily. Slowly return to starting position. Repeat _10__ times. Band color __red____

## 2015-10-13 ENCOUNTER — Ambulatory Visit: Payer: Medicare Other

## 2015-10-14 ENCOUNTER — Ambulatory Visit: Payer: Medicare Other

## 2015-10-14 DIAGNOSIS — C50511 Malignant neoplasm of lower-outer quadrant of right female breast: Secondary | ICD-10-CM | POA: Diagnosis not present

## 2015-10-14 DIAGNOSIS — I890 Lymphedema, not elsewhere classified: Secondary | ICD-10-CM

## 2015-10-14 DIAGNOSIS — M25519 Pain in unspecified shoulder: Secondary | ICD-10-CM | POA: Diagnosis not present

## 2015-10-14 DIAGNOSIS — R0789 Other chest pain: Secondary | ICD-10-CM | POA: Diagnosis not present

## 2015-10-14 DIAGNOSIS — I972 Postmastectomy lymphedema syndrome: Secondary | ICD-10-CM | POA: Diagnosis not present

## 2015-10-14 DIAGNOSIS — Z9013 Acquired absence of bilateral breasts and nipples: Secondary | ICD-10-CM

## 2015-10-14 NOTE — Therapy (Signed)
[place], [place], [address] Phone: [phone]   Fax:  [phone]  Physical Therapy Treatment  Patient Details  Name: Jean Nash MRN: [id number] Date of Birth: 07-May-1941 No Data Recorded  Encounter Date: 10/14/2015      PT End of Session - 10/14/15 1206    Visit Number 8   Number of Visits 9   Date for PT Re-Evaluation 10/15/15   PT Start Time 1111   PT Stop Time 1200   PT Time Calculation (min) 49 min   Activity Tolerance Patient tolerated treatment well   Behavior During Therapy [facility] for tasks assessed/performed      Past Medical History  Diagnosis Date  . Chest pain, atypical     Negative stress test 02/2009  . Other and unspecified hyperlipidemia   . Personal history of urinary calculi   . Pain in joint, shoulder region   . Irritable bowel syndrome   . Chronic insomnia   . GERD (gastroesophageal reflux disease)   . History of colonic polyps   . Fibromyalgia   . Hypothyroidism   . Other abnormal glucose   . Stress incontinence, female   . Macular degeneration   . Cluster headaches     history of migraines  . Meniscus tear     history of right knee torn meniscus  . Arthritis   . Kidney stone   . Cataract   . Depression   . Pneumonia 0630,1601  . Asthma     hx of -no inhalers, no problems  . H/O hiatal hernia   . Breast cancer ([place]) 08/08/13    right LOQ  . Hx of radiation therapy 10/29/13- 12/14/13    right chest wall 5040 cGy 28 sessions, right supraclavicular/axillary region 5040 cGy 28 sessions, right chest wall boost 1000 cGy 5 sessions  . Psoriasis     mild    Past Surgical History  Procedure Laterality Date  . Appendectomy    . Cholecystectomy    . Abdominal hysterectomy    . Tonsillectomy    . Eye surgery      to repair macular hole  . Cataract extraction, bilateral    . Knee arthroscopy      > 10 years ago  . Joint replacement  03/15/11    left knee  replacement  . Colonoscopy    . Ganglion cyst excision      rt foot  . Foot arthroplasty      lt   . Mass excision  11/04/2011    Procedure: EXCISION MASS;  Surgeon: Cammie Sickle., MD;  Location: [place];  Service: Orthopedics;  Laterality: Right;  excisional biopsy right ulna mass  . Hemorrhoid surgery      03/1993  . Breast cyst aspiration      9 cysts  . Toe surgery      preventative crossover toe surg/right foot  . Toe surgery      left foot/screw  in 2nd toe  . Upper gastrointestinal endoscopy    . Skin tags removed      breast, panty line, neckline  . Bilateral total mastectomy with axillary lymph node dissection  08/30/2013    Dr Barry Dienes  . Simple mastectomy with axillary sentinel node biopsy Left 08/30/2013    Procedure: Bilateral Breast Mastectomy ;  Surgeon: Stark Klein, MD;  Location: [place];  Service: General;  Laterality: Left;  Marland Kitchen Mastectomy w/ sentinel node biopsy Right 08/30/2013  Procedure: RIGHT  AXILLARY SENTINEL LYMPH NODE BIOPSY; Right Axillary Node Disection;  Surgeon: Stark Klein, MD;  Location: [place];  Service: General;  Laterality: Right;  Right side nuc med 7:00   . Evacuation breast hematoma Left 08/31/2013    Procedure: EVACUATION HEMATOMA BREAST;  Surgeon: Stark Klein, MD;  Location: [place];  Service: General;  Laterality: Left;    There were no vitals filed for this visit.  Visit Diagnosis:  Lymphedema  Post-mastectomy lymphedema syndrome  S/P bilateral mastectomy  Tightness in chest      Subjective Assessment - 10/14/15 1114    Subjective I've been doing my exercises every morning and I've been feeling much better!! I'll do any of it as long as it's in sitting right now bc my knees are hurting so bad.    Currently in Pain? No/denies                         [facility] Adult PT Treatment/Exercise - 10/14/15 0001    Manual Therapy   Manual Lymphatic Drainage (MLD) In supine: Short neck, superficial and  deep abdominals, left axilla and anterior interaxillary anastomosis, right groin and axillo-inguinal anastomosis, and right UE from dorsal hand to shoulder focusing at upper medial arm over "pouch" of fluid redirecting towards lateral upper arm then back to anastomsis, then Lt S/L for posterior inter-axillary and continued at [place] instructing husband throughout, then had him perform some at the end to assess technique.                PT Education - 10/14/15 1118    Education provided Yes   Education Details Manual lymph drainage and had husband perform pump and stationary circles along anastomsis.    Person(s) Educated Patient;Spouse   Methods Explanation;Demonstration;Handout;Tactile cues;Verbal cues   Comprehension Verbalized understanding;Returned demonstration;Need further instruction;Tactile cues required           Short Term Clinic Goals - 10/01/15 1021    CC Short Term Goal  #2   Title Report >/= 20% reduction in pain in right lateral trunk / scapular area to tolerate swimming and other daily tasks with less difficulty  Pt reports 25% reduction in pain   Status Achieved   CC Short Term Goal  #3   Title Patient will be able to report she able to use her right arm to perform household tasks including cooking with >/= 25% less difficulty  25% reduction reported   Status Achieved          PT Long Term Goals - 06/09/15 1510    PT LONG TERM GOAL #1   Title Pt will be able to turn neck without pain in order to improve functional use of neck and arm.   Time 5   Period Weeks   Status Achieved   PT LONG TERM GOAL #2   Title Pt will be able to flex arm without pain level 2/10 or less   Baseline Pain 1-2/10   Time 5   Period Weeks   Status Achieved           Long Term Clinic Goals - 10/08/15 1025    CC Long Term Goal  #1   Title Patient will report at least 50% improvement in sense of discomfort with right UE swelling.  50% reported at  this time 10/08/15   Status Achieved   CC Long Term Goal  #3   Title report overall pain decreased >/=  50 percent to tolerate daily tasks with less pain  50% reported at this time 10/08/15   CC Long Term Goal  #4   Title pt will be independent in slowly progressive strength program to help with lymphedema management and general function   Status On-going            Plan - 10/14/15 1207    Clinical Impression Statement Pt has been compliant with HEP since issued supine scapular stabs and she reports these have been very helpful with her pain/tightness. Ms. Canter and her husband did very well with instruction in self manual lymph drainage today and husband was receptive to critquing of technique which was to not slide on skin and to get an effective skin stretch with lighter pressure. Pt has 2 more visits and will be ready for discharge at that time as her goals will have been met.     Pt will benefit from skilled therapeutic intervention in order to improve on the following deficits Increased edema   Rehab Potential Good   Clinical Impairments Affecting Rehab Potential Chronic pain   PT Frequency 2x / week   PT Duration 4 weeks   PT Treatment/Interventions Manual lymph drainage;Manual techniques;Patient/family education   PT Next Visit Plan  Cont and review (with husband) manual lymph drainage for right UE and right upper flank; cont manual techniques prn to chest wall ; review/progress HEP   PT Home Exercise Plan Self manual lymph drainage   Consulted and Agree with Plan of Care Patient;Family member/caregiver   Family Member Consulted husband        Problem List Patient Active Problem List   Diagnosis Date Noted  . Osteopenia 08/08/2015  . Chronic insomnia 08/08/2015  . Obstructive sleep apnea 06/25/2015  . Headache 02/03/2015  . Atypical chest pain 07/15/2014  . Arthralgia 02/22/2014  . Psoriasis 02/22/2014  . Breast cancer of lower-outer quadrant of right female breast  ([place]) 08/09/2013  . Neck pain 06/22/2013  . Left knee pain 11/29/2012  . Cluster headache 10/04/2012  . Gastroenteritis 04/26/2012  . Reactive depression (situational) 04/26/2012  . Right wrist pain 02/02/2012  . Right foot pain 10/04/2011  . Tinea capitis 10/04/2011  . Nevus 06/05/2011  . Status post total knee replacement 04/06/2011  . Overactive bladder 03/14/2011  . MOLE 12/21/2010  . KNEE PAIN, LEFT 08/31/2010  . HEARING LOSS 08/17/2010  . PSORIASIS 02/26/2010  . HIRSUTISM 06/16/2009  . SHOULDER PAIN, LEFT 11/21/2008  . HELICOBACTER PYLORI INFECTION, HX OF 06/18/2008  . CYST, IDIOPATHIC 05/20/2008  . FOOT PAIN 04/22/2008  . IRRITABLE BOWEL SYNDROME 04/15/2008  . Asthma 03/19/2008  . GERD 03/19/2008  . COLONIC POLYPS, HX OF 03/19/2008  . NEPHROLITHIASIS, HX OF 03/19/2008  . Hypothyroidism 03/18/2008  . Hyperlipidemia 03/18/2008  . INSOMNIA, CHRONIC 03/18/2008  . FIBROMYALGIA 03/18/2008  . Prediabetes 03/18/2008    Otelia Limes, PTA 10/14/2015, 12:15 PM  [place], [place], [address] Phone: [phone]   Fax:  [phone]  Name: Giovanna Kemmerer MRN: [id number] Date of Birth: 1941/11/01

## 2015-10-14 NOTE — Patient Instructions (Signed)

## 2015-10-15 ENCOUNTER — Ambulatory Visit: Payer: Medicare Other | Admitting: Physical Therapy

## 2015-10-15 ENCOUNTER — Encounter: Payer: Self-pay | Admitting: Internal Medicine

## 2015-10-15 DIAGNOSIS — Z9013 Acquired absence of bilateral breasts and nipples: Secondary | ICD-10-CM | POA: Diagnosis not present

## 2015-10-15 DIAGNOSIS — C50511 Malignant neoplasm of lower-outer quadrant of right female breast: Secondary | ICD-10-CM | POA: Diagnosis not present

## 2015-10-15 DIAGNOSIS — I972 Postmastectomy lymphedema syndrome: Secondary | ICD-10-CM | POA: Diagnosis not present

## 2015-10-15 DIAGNOSIS — R0789 Other chest pain: Secondary | ICD-10-CM | POA: Diagnosis not present

## 2015-10-15 DIAGNOSIS — I890 Lymphedema, not elsewhere classified: Secondary | ICD-10-CM | POA: Diagnosis not present

## 2015-10-15 DIAGNOSIS — M25519 Pain in unspecified shoulder: Secondary | ICD-10-CM | POA: Diagnosis not present

## 2015-10-15 NOTE — Therapy (Signed)
[place], [place], [address] Phone: [phone]   Fax:  [phone]  Physical Therapy Treatment  Patient Details  Name: Jean Nash MRN: [id number] Date of Birth: 1941-09-09 No Data Recorded  Encounter Date: 10/15/2015      PT End of Session - 10/15/15 1211    Visit Number 9   Number of Visits 10   Date for PT Re-Evaluation 10/15/15   PT Start Time 1020   PT Stop Time 1102   PT Time Calculation (min) 42 min   Activity Tolerance Patient tolerated treatment well   Behavior During Therapy [facility] for tasks assessed/performed      Past Medical History  Diagnosis Date  . Chest pain, atypical     Negative stress test 02/2009  . Other and unspecified hyperlipidemia   . Personal history of urinary calculi   . Pain in joint, shoulder region   . Irritable bowel syndrome   . Chronic insomnia   . GERD (gastroesophageal reflux disease)   . History of colonic polyps   . Fibromyalgia   . Hypothyroidism   . Other abnormal glucose   . Stress incontinence, female   . Macular degeneration   . Cluster headaches     history of migraines  . Meniscus tear     history of right knee torn meniscus  . Arthritis   . Kidney stone   . Cataract   . Depression   . Pneumonia 8588,5027  . Asthma     hx of -no inhalers, no problems  . H/O hiatal hernia   . Breast cancer ([place]) 08/08/13    right LOQ  . Hx of radiation therapy 10/29/13- 12/14/13    right chest wall 5040 cGy 28 sessions, right supraclavicular/axillary region 5040 cGy 28 sessions, right chest wall boost 1000 cGy 5 sessions  . Psoriasis     mild    Past Surgical History  Procedure Laterality Date  . Appendectomy    . Cholecystectomy    . Abdominal hysterectomy    . Tonsillectomy    . Eye surgery      to repair macular hole  . Cataract extraction, bilateral    . Knee arthroscopy      > 10 years ago  . Joint replacement  03/15/11    left knee  replacement  . Colonoscopy    . Ganglion cyst excision      rt foot  . Foot arthroplasty      lt   . Mass excision  11/04/2011    Procedure: EXCISION MASS;  Surgeon: Cammie Sickle., MD;  Location: [place];  Service: Orthopedics;  Laterality: Right;  excisional biopsy right ulna mass  . Hemorrhoid surgery      03/1993  . Breast cyst aspiration      9 cysts  . Toe surgery      preventative crossover toe surg/right foot  . Toe surgery      left foot/screw  in 2nd toe  . Upper gastrointestinal endoscopy    . Skin tags removed      breast, panty line, neckline  . Bilateral total mastectomy with axillary lymph node dissection  08/30/2013    Dr Barry Dienes  . Simple mastectomy with axillary sentinel node biopsy Left 08/30/2013    Procedure: Bilateral Breast Mastectomy ;  Surgeon: Stark Klein, MD;  Location: [place];  Service: General;  Laterality: Left;  Marland Kitchen Mastectomy w/ sentinel node biopsy Right 08/30/2013  Procedure: RIGHT  AXILLARY SENTINEL LYMPH NODE BIOPSY; Right Axillary Node Disection;  Surgeon: Stark Klein, MD;  Location: [place];  Service: General;  Laterality: Right;  Right side nuc med 7:00   . Evacuation breast hematoma Left 08/31/2013    Procedure: EVACUATION HEMATOMA BREAST;  Surgeon: Stark Klein, MD;  Location: [place];  Service: General;  Laterality: Left;    There were no vitals filed for this visit.  Visit Diagnosis:  Post-mastectomy lymphedema syndrome      Subjective Assessment - 10/15/15 1022    Subjective Didn't get much sleep last night.  Mouth feels okay.  Mouth doesn't hurt.   Currently in Pain? No/denies                         [facility] Adult PT Treatment/Exercise - 10/15/15 0001    Self-Care   Self-Care Other Self-Care Comments   Other Self-Care Comments  Verbal cueing, correction, and tactile cueing to patient's husband while having him perform manual lymph drainage as described below.   Manual Therapy   Manual Lymphatic  Drainage (MLD) In supine: Short neck, superficial and deep abdominals, left axilla and anterior interaxillary anastomosis, right groin and axillo-inguinal anastomosis, and right UE from dorsal hand to shoulder; Lt S/L for posterior inter-axillary and continued at Rt axillo-inguinal anastomsis.                PT Education - 10/15/15 1211    Education provided Yes   Education Details manual lymph drainage   Person(s) Educated Spouse   Methods Explanation;Tactile cues;Verbal cues   Comprehension Verbalized understanding;Returned demonstration           Short Term Clinic Goals - 10/01/15 1021    CC Short Term Goal  #2   Title Report >/= 20% reduction in pain in right lateral trunk / scapular area to tolerate swimming and other daily tasks with less difficulty  Pt reports 25% reduction in pain   Status Achieved   CC Short Term Goal  #3   Title Patient will be able to report she able to use her right arm to perform household tasks including cooking with >/= 25% less difficulty  25% reduction reported   Status Achieved          PT Long Term Goals - 06/09/15 1510    PT LONG TERM GOAL #1   Title Pt will be able to turn neck without pain in order to improve functional use of neck and arm.   Time 5   Period Weeks   Status Achieved   PT LONG TERM GOAL #2   Title Pt will be able to flex arm without pain level 2/10 or less   Baseline Pain 1-2/10   Time 5   Period Weeks   Status Achieved           Long Term Clinic Goals - 10/15/15 1214    CC Long Term Goal  #3   Title report overall pain decreased >/= 50 percent to tolerate daily tasks with less pain   Status On-going   CC Long Term Goal  #4   Title pt will be independent in slowly progressive strength program to help with lymphedema management and general function   Status Partially Met            Plan - 10/15/15 1211    Clinical Impression Statement Patient's husband did fairly well performing manual lymph  drainage with cueing throughout; needed reminders  not to slide on skin and tended to press harder than was needed.  Pt. reports doing her strengthening with Theraband daily.  She was give a green Theraband today for progression of resistance when ready.   Pt will benefit from skilled therapeutic intervention in order to improve on the following deficits Increased edema   Rehab Potential Good   Clinical Impairments Affecting Rehab Potential Chronic pain   PT Frequency 2x / week   PT Duration 4 weeks   PT Treatment/Interventions Manual lymph drainage;Manual techniques;Patient/family education;ADLs/Self Care Home Management   PT Next Visit Plan Review manual lymph drainage as needed.  Remeasure and discharge.   PT Home Exercise Plan manual lymph drainage by husband daily   Consulted and Agree with Plan of Care Patient;Family member/caregiver   Family Member Consulted husband        Problem List Patient Active Problem List   Diagnosis Date Noted  . Osteopenia 08/08/2015  . Chronic insomnia 08/08/2015  . Obstructive sleep apnea 06/25/2015  . Headache 02/03/2015  . Atypical chest pain 07/15/2014  . Arthralgia 02/22/2014  . Psoriasis 02/22/2014  . Breast cancer of lower-outer quadrant of right female breast ([place]) 08/09/2013  . Neck pain 06/22/2013  . Left knee pain 11/29/2012  . Cluster headache 10/04/2012  . Gastroenteritis 04/26/2012  . Reactive depression (situational) 04/26/2012  . Right wrist pain 02/02/2012  . Right foot pain 10/04/2011  . Tinea capitis 10/04/2011  . Nevus 06/05/2011  . Status post total knee replacement 04/06/2011  . Overactive bladder 03/14/2011  . MOLE 12/21/2010  . KNEE PAIN, LEFT 08/31/2010  . HEARING LOSS 08/17/2010  . PSORIASIS 02/26/2010  . HIRSUTISM 06/16/2009  . SHOULDER PAIN, LEFT 11/21/2008  . HELICOBACTER PYLORI INFECTION, HX OF 06/18/2008  . CYST, IDIOPATHIC 05/20/2008  . FOOT PAIN 04/22/2008  . IRRITABLE BOWEL SYNDROME 04/15/2008  .  Asthma 03/19/2008  . GERD 03/19/2008  . COLONIC POLYPS, HX OF 03/19/2008  . NEPHROLITHIASIS, HX OF 03/19/2008  . Hypothyroidism 03/18/2008  . Hyperlipidemia 03/18/2008  . INSOMNIA, CHRONIC 03/18/2008  . FIBROMYALGIA 03/18/2008  . Prediabetes 03/18/2008    Analisia Kingsford 10/15/2015, 12:16 PM  [place], [place], [address] Phone: [phone]   Fax:  [phone]  Name: Chrisma Hurlock MRN: [id number] Date of Birth: [birth year]    Serafina Royals, PT 10/15/2015 12:16 PM

## 2015-10-16 ENCOUNTER — Encounter: Payer: Self-pay | Admitting: Internal Medicine

## 2015-10-20 DIAGNOSIS — N3945 Continuous leakage: Secondary | ICD-10-CM | POA: Diagnosis not present

## 2015-10-20 DIAGNOSIS — Z6839 Body mass index (BMI) 39.0-39.9, adult: Secondary | ICD-10-CM | POA: Diagnosis not present

## 2015-10-20 DIAGNOSIS — Z01419 Encounter for gynecological examination (general) (routine) without abnormal findings: Secondary | ICD-10-CM | POA: Diagnosis not present

## 2015-10-20 DIAGNOSIS — R351 Nocturia: Secondary | ICD-10-CM | POA: Diagnosis not present

## 2015-10-22 ENCOUNTER — Ambulatory Visit: Payer: Medicare Other | Attending: Oncology | Admitting: Physical Therapy

## 2015-10-22 DIAGNOSIS — I972 Postmastectomy lymphedema syndrome: Secondary | ICD-10-CM | POA: Insufficient documentation

## 2015-10-22 NOTE — Therapy (Signed)
[place], [place], [address] Phone: [phone]   Fax:  [phone]  Physical Therapy Treatment  Patient Details  Name: Jean Nash MRN: [id number] Date of Birth: [birth year] No Data Recorded  Encounter Date: 10/22/2015      PT End of Session - 10/22/15 1241    Visit Number 10   Number of Visits 10   Date for PT Re-Evaluation 10/15/15   PT Start Time 1020   PT Stop Time 1100   PT Time Calculation (min) 40 min   Activity Tolerance Patient tolerated treatment well   Behavior During Therapy [facility] for tasks assessed/performed      Past Medical History  Diagnosis Date  . Chest pain, atypical     Negative stress test 02/2009  . Other and unspecified hyperlipidemia   . Personal history of urinary calculi   . Pain in joint, shoulder region   . Irritable bowel syndrome   . Chronic insomnia   . GERD (gastroesophageal reflux disease)   . History of colonic polyps   . Fibromyalgia   . Hypothyroidism   . Other abnormal glucose   . Stress incontinence, female   . Macular degeneration   . Cluster headaches     history of migraines  . Meniscus tear     history of right knee torn meniscus  . Arthritis   . Kidney stone   . Cataract   . Depression   . Pneumonia 0315,9458  . Asthma     hx of -no inhalers, no problems  . H/O hiatal hernia   . Breast cancer ([place]) 08/08/13    right LOQ  . Hx of radiation therapy 10/29/13- 12/14/13    right chest wall 5040 cGy 28 sessions, right supraclavicular/axillary region 5040 cGy 28 sessions, right chest wall boost 1000 cGy 5 sessions  . Psoriasis     mild    Past Surgical History  Procedure Laterality Date  . Appendectomy    . Cholecystectomy    . Abdominal hysterectomy    . Tonsillectomy    . Eye surgery      to repair macular hole  . Cataract extraction, bilateral    . Knee arthroscopy      > 10 years ago  . Joint replacement  03/15/11    left knee  replacement  . Colonoscopy    . Ganglion cyst excision      rt foot  . Foot arthroplasty      lt   . Mass excision  11/04/2011    Procedure: EXCISION MASS;  Surgeon: Cammie Sickle., MD;  Location: [place];  Service: Orthopedics;  Laterality: Right;  excisional biopsy right ulna mass  . Hemorrhoid surgery      03/1993  . Breast cyst aspiration      9 cysts  . Toe surgery      preventative crossover toe surg/right foot  . Toe surgery      left foot/screw  in 2nd toe  . Upper gastrointestinal endoscopy    . Skin tags removed      breast, panty line, neckline  . Bilateral total mastectomy with axillary lymph node dissection  08/30/2013    Dr Barry Dienes  . Simple mastectomy with axillary sentinel node biopsy Left 08/30/2013    Procedure: Bilateral Breast Mastectomy ;  Surgeon: Stark Klein, MD;  Location: [facility];  Service: General;  Laterality: Left;  Marland Kitchen Mastectomy w/ sentinel node biopsy Right 08/30/2013  Procedure: RIGHT  AXILLARY SENTINEL LYMPH NODE BIOPSY; Right Axillary Node Disection;  Surgeon: Stark Klein, MD;  Location: [facility];  Service: General;  Laterality: Right;  Right side nuc med 7:00   . Evacuation breast hematoma Left 08/31/2013    Procedure: EVACUATION HEMATOMA BREAST;  Surgeon: Stark Klein, MD;  Location: [place];  Service: General;  Laterality: Left;    There were no vitals filed for this visit.  Visit Diagnosis:  Post-mastectomy lymphedema syndrome      Subjective Assessment - 10/22/15 1032    Subjective Pt states she has sharp pains in right axilary edema area intermittently    Pertinent History Right arm swelling began around end of March 2016 for unknown reasons.  She had a bilateral mastectomy with a right axillary node dissection removing 16 lymph nodes and 9 of those were positive.  Currently taking tamoxifen.    Patient Stated Goals reduce arm swelling   Currently in Pain? No/denies               LYMPHEDEMA/ONCOLOGY  QUESTIONNAIRE - 10/22/15 1052    Right Upper Extremity Lymphedema   15 cm Proximal to Olecranon Process 39.3 cm   10 cm Proximal to Olecranon Process 37.5 cm   Olecranon Process 25.6 cm   10 cm Proximal to Ulnar Styloid Process 24 cm   Just Proximal to Ulnar Styloid Process 16.6 cm   Across Hand at [employer] 17.8 cm   At [place] of 2nd Digit 5.9 cm                  [facility] Adult PT Treatment/Exercise - 10/22/15 0001    Manual Therapy   Manual Lymphatic Drainage (MLD) In supine: Short neck, superficial and deep abdominals, left axilla and anterior interaxillary anastomosis, right groin and axillo-inguinal anastomosis, and right UE from dorsal hand to shoulder; Lt S/L for posterior inter-axillary and continued at Rt axillo-inguinal anastomsis.                   Short Term Clinic Goals - 10/01/15 1021    CC Short Term Goal  #2   Title Report >/= 20% reduction in pain in right lateral trunk / scapular area to tolerate swimming and other daily tasks with less difficulty  Pt reports 25% reduction in pain   Status Achieved   CC Short Term Goal  #3   Title Patient will be able to report she able to use her right arm to perform household tasks including cooking with >/= 25% less difficulty  25% reduction reported   Status Achieved          PT Long Term Goals - 06/09/15 1510    PT LONG TERM GOAL #1   Title Pt will be able to turn neck without pain in order to improve functional use of neck and arm.   Time 5   Period Weeks   Status Achieved   PT LONG TERM GOAL #2   Title Pt will be able to flex arm without pain level 2/10 or less   Baseline Pain 1-2/10   Time 5   Period Weeks   Status Achieved           Long Term Clinic Goals - 10/22/15 1030    CC Long Term Goal  #1   Title Patient will report at least 50% improvement in sense of discomfort with right UE swelling.   Status Achieved   CC Long Term Goal  #2  Title Circumference measurement at 15 cm.  proximal to olecranon on right arm will reduce to 39.3 cm.   Baseline 39.3 on 11/19/2016   Status Achieved   CC Long Term Goal  #3   Title report overall pain decreased >/= 50 percent to tolerate daily tasks with less pain   Status Achieved   CC Long Term Goal  #4   Title pt will be independent in slowly progressive strength program to help with lymphedema management and general function  per patient report   Status Achieved            Plan - 2015/11/20 1250    Clinical Impression Statement Pt actually has increased circumference measurements today, but she states she stopped wearing her sleeve and her husband has not helped her with manual lymph drainage.  Encouraged pt to do the technique herself on the parts of her arm that she can reach and to wear her compression sleeve.  She has many MD appointments and plans to have knee surgery on December 19 so she is realdy to stop the episode of treatment    Clinical Impairments Affecting Rehab Potential Chronic pain   PT Next Visit Plan discharge           G-Codes - 11-20-2015 1242    Functional Assessment Tool Used clinical judgement   Functional Limitation Self care   Self Care Goal Status ([id number]) At least 1 percent but less than 20 percent impaired, limited or restricted   Self Care Discharge Status [phone]) At least 1 percent but less than 20 percent impaired, limited or restricted      Problem List Patient Active Problem List   Diagnosis Date Noted  . Osteopenia 08/08/2015  . Chronic insomnia 08/08/2015  . Obstructive sleep apnea 06/25/2015  . Headache 02/03/2015  . Atypical chest pain 07/15/2014  . Arthralgia 02/22/2014  . Psoriasis 02/22/2014  . Breast cancer of lower-outer quadrant of right female breast ([place]) 08/09/2013  . Neck pain 06/22/2013  . Left knee pain 11/29/2012  . Cluster headache 10/04/2012  . Gastroenteritis 04/26/2012  . Reactive depression (situational) 04/26/2012  . Right wrist pain 02/02/2012  . Right  foot pain 10/04/2011  . Tinea capitis 10/04/2011  . Nevus 06/05/2011  . Status post total knee replacement 04/06/2011  . Overactive bladder 03/14/2011  . MOLE 12/21/2010  . KNEE PAIN, LEFT 08/31/2010  . HEARING LOSS 08/17/2010  . PSORIASIS 02/26/2010  . HIRSUTISM 06/16/2009  . SHOULDER PAIN, LEFT 11/21/2008  . HELICOBACTER PYLORI INFECTION, HX OF 06/18/2008  . CYST, IDIOPATHIC 05/20/2008  . FOOT PAIN 04/22/2008  . IRRITABLE BOWEL SYNDROME 04/15/2008  . Asthma 03/19/2008  . GERD 03/19/2008  . COLONIC POLYPS, HX OF 03/19/2008  . NEPHROLITHIASIS, HX OF 03/19/2008  . Hypothyroidism 03/18/2008  . Hyperlipidemia 03/18/2008  . INSOMNIA, CHRONIC 03/18/2008  . FIBROMYALGIA 03/18/2008  . Prediabetes 03/18/2008    PHYSICAL THERAPY DISCHARGE SUMMARY  Visits from Start of Care: 10  Current functional level related to goals / functional outcomes: Pt states she knows what she needs to do to manage her lymphedema at home.    Remaining deficits: Lymphedema of right arm    Education / Equipment: Self manual lymph drainage  Plan: Patient agrees to discharge.  Patient goals were partially met. Patient is being discharged due to meeting the stated rehab goals.  ?????              Donato Heinz. Owens Shark, PT  11-20-15, 12:54 PM  [place]  [place], [place], [address] Phone: [phone]   Fax:  [phone]  Name: Jean Nash MRN: [id number] Date of Birth: December 18, 1940

## 2015-10-28 ENCOUNTER — Encounter: Payer: Self-pay | Admitting: Podiatry

## 2015-10-28 ENCOUNTER — Encounter (HOSPITAL_COMMUNITY): Payer: Self-pay

## 2015-10-28 ENCOUNTER — Ambulatory Visit (INDEPENDENT_AMBULATORY_CARE_PROVIDER_SITE_OTHER): Payer: Medicare Other | Admitting: Podiatry

## 2015-10-28 ENCOUNTER — Encounter (HOSPITAL_COMMUNITY)
Admission: RE | Admit: 2015-10-28 | Discharge: 2015-10-28 | Disposition: A | Discharge status: Home / Self Care | Payer: Medicare Other | Source: Ambulatory Visit | Attending: Orthopedic Surgery | Admitting: Orthopedic Surgery

## 2015-10-28 DIAGNOSIS — M79676 Pain in unspecified toe(s): Secondary | ICD-10-CM

## 2015-10-28 DIAGNOSIS — M1711 Unilateral primary osteoarthritis, right knee: Secondary | ICD-10-CM | POA: Insufficient documentation

## 2015-10-28 DIAGNOSIS — Z01812 Encounter for preprocedural laboratory examination: Secondary | ICD-10-CM | POA: Diagnosis not present

## 2015-10-28 DIAGNOSIS — B351 Tinea unguium: Secondary | ICD-10-CM

## 2015-10-28 HISTORY — DX: Other specified disorders of bone density and structure, unspecified site: M85.80

## 2015-10-28 HISTORY — DX: Anxiety disorder, unspecified: F41.9

## 2015-10-28 HISTORY — DX: Lymphedema, not elsewhere classified: I89.0

## 2015-10-28 HISTORY — DX: Sleep apnea, unspecified: G47.30

## 2015-10-28 HISTORY — DX: Personal history of other medical treatment: Z92.89

## 2015-10-28 LAB — CBC
HCT: 42.3 % (ref 36.0–46.0)
HEMOGLOBIN: 14.1 g/dL (ref 12.0–15.0)
MCH: 29.9 pg (ref 26.0–34.0)
MCHC: 33.3 g/dL (ref 30.0–36.0)
MCV: 89.6 fL (ref 78.0–100.0)
PLATELETS: 193 10*3/uL (ref 150–400)
RBC: 4.72 MIL/uL (ref 3.87–5.11)
RDW: 13.6 % (ref 11.5–15.5)
WBC: 7.3 10*3/uL (ref 4.0–10.5)

## 2015-10-28 LAB — COMPREHENSIVE METABOLIC PANEL
ALK PHOS: 63 U/L (ref 38–126)
ALT: 33 U/L (ref 14–54)
ANION GAP: 7 (ref 5–15)
AST: 26 U/L (ref 15–41)
Albumin: 3.9 g/dL (ref 3.5–5.0)
BUN: 20 mg/dL (ref 6–20)
CALCIUM: 9.1 mg/dL (ref 8.9–10.3)
CO2: 26 mmol/L (ref 22–32)
Chloride: 105 mmol/L (ref 101–111)
Creatinine, Ser: 0.81 mg/dL (ref 0.44–1.00)
GFR calc non Af Amer: >60 mL/min (ref >60)
Glucose, Bld: 115 mg/dL — ABNORMAL HIGH (ref 65–99)
POTASSIUM: 4.2 mmol/L (ref 3.5–5.1)
SODIUM: 138 mmol/L (ref 135–145)
Total Bilirubin: 0.7 mg/dL (ref 0.3–1.2)
Total Protein: 6.8 g/dL (ref 6.5–8.1)

## 2015-10-28 LAB — URINALYSIS, ROUTINE W REFLEX MICROSCOPIC
Bilirubin Urine: NEGATIVE
GLUCOSE, UA: NEGATIVE mg/dL
HGB URINE DIPSTICK: NEGATIVE
Ketones, ur: NEGATIVE mg/dL
Leukocytes, UA: NEGATIVE
Nitrite: NEGATIVE
PROTEIN: NEGATIVE mg/dL
SPECIFIC GRAVITY, URINE: 1.011 (ref 1.005–1.030)
pH: 6 (ref 5.0–8.0)

## 2015-10-28 LAB — PROTIME-INR
INR: 0.99 (ref 0.00–1.49)
Prothrombin Time: 13.3 seconds (ref 11.6–15.2)

## 2015-10-28 LAB — APTT: APTT: 24 s (ref 24–37)

## 2015-10-28 LAB — SURGICAL PCR SCREEN
MRSA, PCR: NEGATIVE
Staphylococcus aureus: NEGATIVE

## 2015-10-28 NOTE — Patient Instructions (Addendum)
YOUR PROCEDURE IS SCHEDULED ON :  11/03/15  REPORT TO [facility] MAIN ENTRANCE FOLLOW SIGNS TO EAST ELEVATOR - GO TO 3rd FLOOR CHECK IN AT 3 EAST NURSES STATION (SHORT STAY) AT: 10:45 AM  CALL THIS NUMBER IF YOU HAVE PROBLEMS THE MORNING OF SURGERY [phone]  REMEMBER:ONLY 1 PER PERSON MAY GO TO SHORT STAY WITH YOU TO GET READY THE MORNING OF YOUR SURGERY  DO NOT EAT FOOD  AFTER MIDNIGHT  MAY HAVE CLEAR LIQUIDS UNTIL 7:45 AM  TAKE THESE MEDICINES THE MORNING OF SURGERY:  NEXIUM / LEVOTHYROXINE / TAMOXIFEN / CITALOPRAM  BRING C-PAP TUBING AND MASK TO HOSPITAL  CLEAR LIQUID DIET  Foods Allowed                                                                     Foods Excluded  Coffee and tea, regular and decaf                             liquids that you cannot  Plain Jell-O in any flavor                                             see through such as: Fruit ices (not with fruit pulp)                                     milk, soups, orange juice  Iced Popsicles                                                       All solid food Carbonated beverages, regular and diet                                    Cranberry, grape and apple juices Sports drinks like Gatorade Lightly seasoned clear broth or consume(fat free) Sugar, honey syrup   _____________________________________________________________________    YOU MAY NOT HAVE ANY METAL ON YOUR BODY INCLUDING HAIR PINS AND PIERCING'S. DO NOT WEAR JEWELRY, MAKEUP, LOTIONS, POWDERS OR PERFUMES. DO NOT WEAR NAIL POLISH. DO NOT SHAVE 48 HRS PRIOR TO SURGERY. MEN MAY SHAVE FACE AND NECK.  DO NOT [place]. [facility] IS NOT RESPONSIBLE FOR VALUABLES.  CONTACTS, DENTURES OR PARTIALS MAY NOT BE WORN TO SURGERY. LEAVE SUITCASE IN CAR. CAN BE BROUGHT TO ROOM AFTER SURGERY.  PATIENTS DISCHARGED THE DAY OF SURGERY WILL NOT BE ALLOWED TO DRIVE HOME.  PLEASE READ OVER THE FOLLOWING INSTRUCTION  SHEETS _________________________________________________________________________________                                          [facility] -  PREPARING FOR SURGERY  Before surgery, you can play an important role.  Because skin is not sterile, your skin needs to be as free of germs as possible.  You can reduce the number of germs on your skin by washing with CHG (chlorahexidine gluconate) soap before surgery.  CHG is an antiseptic cleaner which kills germs and bonds with the skin to continue killing germs even after washing. Please DO NOT use if you have an allergy to CHG or antibacterial soaps.  If your skin becomes reddened/irritated stop using the CHG and inform your nurse when you arrive at Short Stay. Do not shave (including legs and underarms) for at least 48 hours prior to the first CHG shower.  You may shave your face. Please follow these instructions carefully:   1.  Shower with CHG Soap the night before surgery and the  morning of Surgery.   2.  If you choose to wash your hair, wash your hair first as usual with your  normal  Shampoo.   3.  After you shampoo, rinse your hair and body thoroughly to remove the  shampoo.                                         4.  Use CHG as you would any other liquid soap.  You can apply chg directly  to the skin and wash . Gently wash with scrungie or clean wascloth    5.  Apply the CHG Soap to your body ONLY FROM THE NECK DOWN.   Do not use on open                           Wound or open sores. Avoid contact with eyes, ears mouth and genitals (private parts).                        Genitals (private parts) with your normal soap.              6.  Wash thoroughly, paying special attention to the area where your surgery  will be performed.   7.  Thoroughly rinse your body with warm water from the neck down.   8.  DO NOT shower/wash with your normal soap after using and rinsing off  the CHG Soap .                9.  Pat yourself dry with a clean  towel.             10.  Wear clean night clothes to bed after shower             11.  Place clean sheets on your bed the night of your first shower and do not  sleep with pets.  Day of Surgery : Do not apply any lotions/deodorants the morning of surgery.  Please wear clean clothes to the hospital/surgery center.  FAILURE TO FOLLOW THESE INSTRUCTIONS MAY RESULT IN THE CANCELLATION OF YOUR SURGERY    PATIENT SIGNATURE_________________________________  ______________________________________________________________________     Jean Nash  An incentive spirometer is a tool that can help keep your lungs clear and active. This tool measures how well you are filling your lungs with each breath. Taking long deep breaths may help reverse or decrease the chance of developing breathing (pulmonary) problems (especially infection)  following:  A long period of time when you are unable to move or be active. BEFORE THE PROCEDURE   If the spirometer includes an indicator to show your best effort, your nurse or respiratory therapist will set it to a desired goal.  If possible, sit up straight or lean slightly forward. Try not to slouch.  Hold the incentive spirometer in an upright position. INSTRUCTIONS FOR USE   Sit on the edge of your bed if possible, or sit up as far as you can in bed or on a chair.  Hold the incentive spirometer in an upright position.  Breathe out normally.  Place the mouthpiece in your mouth and seal your lips tightly around it.  Breathe in slowly and as deeply as possible, raising the piston or the ball toward the top of the column.  Hold your breath for 3-5 seconds or for as long as possible. Allow the piston or ball to fall to the bottom of the column.  Remove the mouthpiece from your mouth and breathe out normally.  Rest for a few seconds and repeat Steps 1 through 7 at least 10 times every 1-2 hours when you are awake. Take your time and take a few  normal breaths between deep breaths.  The spirometer may include an indicator to show your best effort. Use the indicator as a goal to work toward during each repetition.  After each set of 10 deep breaths, practice coughing to be sure your lungs are clear. If you have an incision (the cut made at the time of surgery), support your incision when coughing by placing a pillow or rolled up towels firmly against it. Once you are able to get out of bed, walk around indoors and cough well. You may stop using the incentive spirometer when instructed by your caregiver.  RISKS AND COMPLICATIONS  Take your time so you do not get dizzy or light-headed.  If you are in pain, you may need to take or ask for pain medication before doing incentive spirometry. It is harder to take a deep breath if you are having pain. AFTER USE  Rest and breathe slowly and easily.  It can be helpful to keep track of a log of your progress. Your caregiver can provide you with a simple table to help with this. If you are using the spirometer at home, follow these instructions: [place] IF:   You are having difficultly using the spirometer.  You have trouble using the spirometer as often as instructed.  Your pain medication is not giving enough relief while using the spirometer.  You develop fever of 100.5 F (38.1 C) or higher. SEEK IMMEDIATE MEDICAL CARE IF:   You cough up bloody sputum that had not been present before.  You develop fever of 102 F (38.9 C) or greater.  You develop worsening pain at or near the incision site. MAKE SURE YOU:   Understand these instructions.  Will watch your condition.  Will get help right away if you are not doing well or get worse. Document Released: 03/14/2007 Document Revised: 01/24/2012 Document Reviewed: 05/15/2007 Exit[employer] Patient Information 2014 [employer], [place].   ________________________________________________________________________  WHAT IS A BLOOD  TRANSFUSION? Blood Transfusion Information  A transfusion is the replacement of blood or some of its parts. Blood is made up of multiple cells which provide different functions.  Red blood cells carry oxygen and are used for blood loss replacement.  White blood cells fight against infection.  Platelets control  bleeding.  Plasma helps clot blood.  Other blood products are available for specialized needs, such as hemophilia or other clotting disorders. BEFORE THE TRANSFUSION  Who gives blood for transfusions?   Healthy volunteers who are fully evaluated to make sure their blood is safe. This is blood bank blood. Transfusion therapy is the safest it has ever been in the practice of medicine. Before blood is taken from a donor, a complete history is taken to make sure that person has no history of diseases nor engages in risky social behavior (examples are intravenous drug use or sexual activity with multiple partners). The donor's travel history is screened to minimize risk of transmitting infections, such as malaria. The donated blood is tested for signs of infectious diseases, such as HIV and hepatitis. The blood is then tested to be sure it is compatible with you in order to minimize the chance of a transfusion reaction. If you or a relative donates blood, this is often done in anticipation of surgery and is not appropriate for emergency situations. It takes many days to process the donated blood. RISKS AND COMPLICATIONS Although transfusion therapy is very safe and saves many lives, the main dangers of transfusion include:   Getting an infectious disease.  Developing a transfusion reaction. This is an allergic reaction to something in the blood you were given. Every precaution is taken to prevent this. The decision to have a blood transfusion has been considered carefully by your caregiver before blood is given. Blood is not given unless the benefits outweigh the risks. AFTER THE  TRANSFUSION  Right after receiving a blood transfusion, you will usually feel much better and more energetic. This is especially true if your red blood cells have gotten low (anemic). The transfusion raises the level of the red blood cells which carry oxygen, and this usually causes an energy increase.  The nurse administering the transfusion will monitor you carefully for complications. HOME CARE INSTRUCTIONS  No special instructions are needed after a transfusion. You may find your energy is better. Speak with your caregiver about any limitations on activity for underlying diseases you may have. SEEK MEDICAL CARE IF:   Your condition is not improving after your transfusion.  You develop redness or irritation at the intravenous (IV) site. SEEK IMMEDIATE MEDICAL CARE IF:  Any of the following symptoms occur over the next 12 hours:  Shaking chills.  You have a temperature by mouth above 102 F (38.9 C), not controlled by medicine.  Chest, back, or muscle pain.  People around you feel you are not acting correctly or are confused.  Shortness of breath or difficulty breathing.  Dizziness and fainting.  You get a rash or develop hives.  You have a decrease in urine output.  Your urine turns a dark color or changes to pink, red, or brown. Any of the following symptoms occur over the next 10 days:  You have a temperature by mouth above 102 F (38.9 C), not controlled by medicine.  Shortness of breath.  Weakness after normal activity.  The white part of the eye turns yellow (jaundice).  You have a decrease in the amount of urine or are urinating less often.  Your urine turns a dark color or changes to pink, red, or brown. Document Released: 10/29/2000 Document Revised: 01/24/2012 Document Reviewed: 06/17/2008 [facility] Patient Information 2014 [place], [place].  _______________________________________________________________________

## 2015-10-28 NOTE — Progress Notes (Signed)
She presents today for chief complaint of painful elongated toenails.  Objective: pulses are strongly palpable bilateral. Her nails are thick yellow dystrophic with mycotic and painful palpation.  Assessment: pain limb secondary to onychomycosis 1 through 5 bilateral.  Plan: debridement of toenails 1 through 5 bilateral is covered service secondary to pain.  Remembers asked how her right knee surgery went.

## 2015-10-29 NOTE — H&P (Signed)
TOTAL KNEE ADMISSION H&P  Patient is being admitted for right unicompartmental knee arthroplasty.  Subjective:  Chief Complaint:right knee pain.  HPI: Jean Nash, 74 y.o. female, has a history of pain and functional disability in the right knee due to arthritis and has failed non-surgical conservative treatments for greater than 12 weeks to includeNSAID's and/or analgesics, corticosteriod injections, viscosupplementation injections and activity modification.  Onset of symptoms was gradual, starting 5 years ago with gradually worsening course since that time. The patient noted no past surgery on the right knee(s).  Patient currently rates pain in the right knee(s) at 7 out of 10 with activity. Patient has night pain, worsening of pain with activity and weight bearing, pain that interferes with activities of daily living, pain with passive range of motion, crepitus and joint swelling.  Patient has evidence of periarticular osteophytes and joint space narrowing by imaging studies.  There is no active infection.  Patient Active Problem List   Diagnosis Date Noted  . Osteopenia 08/08/2015  . Chronic insomnia 08/08/2015  . Obstructive sleep apnea 06/25/2015  . Headache 02/03/2015  . Atypical chest pain 07/15/2014  . Arthralgia 02/22/2014  . Psoriasis 02/22/2014  . Breast cancer of lower-outer quadrant of right female breast ([facility]) 08/09/2013  . Neck pain 06/22/2013  . Left knee pain 11/29/2012  . Cluster headache 10/04/2012  . Gastroenteritis 04/26/2012  . Reactive depression (situational) 04/26/2012  . Right wrist pain 02/02/2012  . Right foot pain 10/04/2011  . Tinea capitis 10/04/2011  . Nevus 06/05/2011  . Status post total knee replacement 04/06/2011  . Overactive bladder 03/14/2011  . MOLE 12/21/2010  . KNEE PAIN, LEFT 08/31/2010  . HEARING LOSS 08/17/2010  . PSORIASIS 02/26/2010  . HIRSUTISM 06/16/2009  . SHOULDER PAIN, LEFT 11/21/2008  . HELICOBACTER PYLORI INFECTION, HX OF  06/18/2008  . CYST, IDIOPATHIC 05/20/2008  . FOOT PAIN 04/22/2008  . IRRITABLE BOWEL SYNDROME 04/15/2008  . Asthma 03/19/2008  . GERD 03/19/2008  . COLONIC POLYPS, HX OF 03/19/2008  . NEPHROLITHIASIS, HX OF 03/19/2008  . Hypothyroidism 03/18/2008  . Hyperlipidemia 03/18/2008  . INSOMNIA, CHRONIC 03/18/2008  . FIBROMYALGIA 03/18/2008  . Prediabetes 03/18/2008   Past Medical History  Diagnosis Date  . Other and unspecified hyperlipidemia   . Pain in joint, shoulder region   . Irritable bowel syndrome   . Chronic insomnia   . GERD (gastroesophageal reflux disease)   . History of colonic polyps   . Fibromyalgia   . Hypothyroidism   . Other abnormal glucose   . Stress incontinence, female   . Macular degeneration   . Arthritis   . Kidney stone   . Cataract   . Depression   . Pneumonia [id number]  . H/O hiatal hernia   . Breast cancer ([facility]) 08/08/13    right LOQ  . Hx of radiation therapy 10/29/13- 12/14/13    right chest wall 5040 cGy 28 sessions, right supraclavicular/axillary region 5040 cGy 28 sessions, right chest wall boost 1000 cGy 5 sessions  . Asthma     hx of -no inhalers, no problems  . Cluster headaches     history of migraines / NONE FOR SEVERAL YRS  . Osteopenia   . History of transfusion   . Sleep apnea     USES C-PAP  . Anxiety     PHOBIAS  . Lymphedema     RT ARM - WEARS SLEEVE    Past Surgical History  Procedure Laterality Date  . Appendectomy    .  Cholecystectomy    . Abdominal hysterectomy    . Tonsillectomy    . Eye surgery      to repair macular hole  . Cataract extraction, bilateral    . Knee arthroscopy      > 10 years ago  . Joint replacement  03/15/11    left knee replacement  . Colonoscopy    . Ganglion cyst excision      rt foot  . Foot arthroplasty      lt   . Mass excision  11/04/2011    Procedure: EXCISION MASS;  Surgeon: Cammie Sickle., MD;  Location: [facility];  Service: Orthopedics;  Laterality: Right;   excisional biopsy right ulna mass  . Hemorrhoid surgery      03/1993  . Breast cyst aspiration      9 cysts  . Toe surgery      preventative crossover toe surg/right foot  . Toe surgery      left foot/screw  in 2nd toe  . Upper gastrointestinal endoscopy    . Skin tags removed      breast, panty line, neckline  . Bilateral total mastectomy with axillary lymph node dissection  08/30/2013    Dr Barry Dienes  . Simple mastectomy with axillary sentinel node biopsy Left 08/30/2013    Procedure: Bilateral Breast Mastectomy ;  Surgeon: Stark Klein, MD;  Location: [facility];  Service: General;  Laterality: Left;  Marland Kitchen Mastectomy w/ sentinel node biopsy Right 08/30/2013    Procedure: RIGHT  AXILLARY SENTINEL LYMPH NODE BIOPSY; Right Axillary Node Disection;  Surgeon: Stark Klein, MD;  Location: [facility];  Service: General;  Laterality: Right;  Right side nuc med 7:00   . Evacuation breast hematoma Left 08/31/2013    Procedure: EVACUATION HEMATOMA BREAST;  Surgeon: Stark Klein, MD;  Location: [facility];  Service: General;  Laterality: Left;      Current outpatient prescriptions:  .  Calcium-Vitamin D-Vitamin K 500-100-40 MG-UNT-MCG CHEW, Chew 1 tablet by mouth 2 (two) times daily. , Disp: , Rfl:  .  Cholecalciferol (VITAMIN D) 2000 UNITS CAPS, Take 2,000 Units by mouth 2 (two) times daily. , Disp: , Rfl:  .  citalopram (CELEXA) 20 MG tablet, Take 1 tablet (20 mg total) by mouth daily. (Patient taking differently: Take 20 mg by mouth 2 (two) times daily. ), Disp: 90 tablet, Rfl: 3 .  docusate sodium (COLACE) 100 MG capsule, Take 100 mg by mouth 2 (two) times daily., Disp: , Rfl:  .  esomeprazole (NEXIUM) 40 MG capsule, Take 1 capsule by mouth  daily, Disp: 90 capsule, Rfl: 0 .  levothyroxine (SYNTHROID, LEVOTHROID) 100 MCG tablet, Take 1 tablet by mouth  daily, Disp: 90 tablet, Rfl: 0 .  Lutein-Zeaxanthin 25-5 MG CAPS, Take 1 capsule by mouth daily. , Disp: , Rfl:  .  meloxicam (MOBIC) 7.5 MG tablet, Take 1  tablet by mouth two  times daily (Patient taking differently: Take 1 tablet by mouth once a day), Disp: 180 tablet, Rfl: 0 .  omega-3 acid ethyl esters (LOVAZA) 1 G capsule, Take 1 capsule by mouth  twice daily, Disp: 180 capsule, Rfl: 2 .  oxyCODONE-acetaminophen (PERCOCET/ROXICET) 5-325 MG per tablet, Take 1 tablet by mouth every 6 (six) hours as needed for severe pain. , Disp: , Rfl:  .  pregabalin (LYRICA) 75 MG capsule, Take 1 capsule (75 mg total) by mouth 2 (two) times daily. (Patient taking differently: Take 75 mg by mouth at bedtime. ),  Disp: 180 capsule, Rfl: 1 .  simvastatin (ZOCOR) 10 MG tablet, Take 1 tablet (10 mg total) by mouth at bedtime., Disp: 90 tablet, Rfl: 3 .  tamoxifen (NOLVADEX) 20 MG tablet, Take 1 tablet (20 mg total) by mouth daily., Disp: 90 tablet, Rfl: 3 .  temazepam (RESTORIL) 15 MG capsule, Take 1 capsule (15 mg total) by mouth at bedtime as needed for sleep. (Patient taking differently: Take 15 mg by mouth at bedtime. ), Disp: 90 capsule, Rfl: 1 .  traMADol (ULTRAM) 50 MG tablet, Take 1 tablet (50 mg total) by mouth every 6 (six) hours as needed for moderate pain., Disp: 120 tablet, Rfl: 1 .  Wheat Dextrin (BENEFIBER PO), Take 1 scoop by mouth daily. , Disp: , Rfl:   No Known Allergies  Social History  Substance Use Topics  . Smoking status: Former Smoker -- 0.10 packs/day for 2 years    Types: Cigarettes    Start date: 11/16/1959    Quit date: 11/15/1960  . Smokeless tobacco: Never Used  . Alcohol Use: No    Family History  Problem Relation Age of Onset  . Stroke Mother     died age 72  . Diabetes Mother   . Breast cancer Mother 56  . Breast cancer Sister 43  . Breast cancer Paternal Aunt 87  . Diabetes Maternal Grandfather   . Breast cancer Paternal Grandmother 38  . Breast cancer Paternal Aunt     dx in her 30s  . Cancer Maternal Grandmother     intra-abdominal cancer  . Brain cancer Maternal Uncle 8  . Brain cancer Cousin 60    maternal cousin   . Brain cancer Cousin 20    paternal cousin     Review of Systems  Constitutional: Positive for malaise/fatigue and diaphoresis. Negative for fever, chills and weight loss.  HENT: Negative.   Eyes: Negative.   Respiratory: Positive for shortness of breath. Negative for cough, hemoptysis, sputum production and wheezing.        SOB with exertion  Cardiovascular: Negative.   Gastrointestinal: Negative.   Genitourinary: Positive for frequency. Negative for dysuria, urgency, hematuria and flank pain.       Positive for incontinence  Musculoskeletal: Positive for myalgias and joint pain. Negative for back pain, falls and neck pain.       Right knee pain  Skin: Positive for itching. Negative for rash.  Neurological: Negative.  Negative for weakness.  Endo/Heme/Allergies: Negative.   Psychiatric/Behavioral: Negative.     Objective:  Physical Exam  Constitutional: She is oriented to person, place, and time. She appears well-developed. No distress.  Obese  HENT:  Head: Normocephalic and atraumatic.  Right Ear: External ear normal.  Left Ear: External ear normal.  Nose: Nose normal.  Mouth/Throat: Oropharynx is clear and moist.  Eyes: Conjunctivae and EOM are normal.  Neck: Normal range of motion. Neck supple.  Cardiovascular: Normal rate, regular rhythm, normal heart sounds and intact distal pulses.   No murmur heard. Respiratory: Effort normal and breath sounds normal. No respiratory distress. She has no wheezes.  GI: Soft. Bowel sounds are normal. She exhibits no distension. There is no tenderness.  Musculoskeletal:       Right hip: Normal.       Left hip: Normal.       Left knee: Normal.  Her right knee shows has no effusion. Range is about 0 to 125. She does not have any significant deformity. She has tenderness only on the  medial side. There is no lateral tenderness or instability.  Neurological: She is alert and oriented to person, place, and time. She has normal strength  and normal reflexes. No sensory deficit.  Skin: No rash noted. She is not diaphoretic. No erythema.  Psychiatric: She has a normal mood and affect. Her behavior is normal.    Vital signs in last 24 hours: Temp:  [98.5 F (36.9 C)] 98.5 F (36.9 C) (12/13 1412) Pulse Rate:  [79] 79 (12/13 1412) Resp:  [18] 18 (12/13 1412) BP: (140)/(62) 140/62 mmHg (12/13 1412) SpO2:  [97 %] 97 % (12/13 1412) Weight:  [90.89 kg (200 lb 6 oz)] 90.89 kg (200 lb 6 oz) (12/13 1412)  Labs:   Estimated body mass index is 38.44 kg/(m^2) as calculated from the following:   Height as of 06/25/15: 4\' 11"  (1.499 m).   Weight as of 06/25/15: 86.365 kg (190 lb 6.4 oz).   Imaging Review Plain radiographs demonstrate severe degenerative joint disease of the right knee(s). The overall alignment ismild varus. The bone quality appears to be good for age and reported activity level.  Assessment/Plan:  End stage primary osteoarthritis, right knee   The patient history, physical examination, clinical judgment of the provider and imaging studies are consistent with end stage degenerative joint disease of the right knee(s) and unicompartmental knee arthroplasty is deemed medically necessary. The treatment options including medical management, injection therapy arthroscopy and arthroplasty were discussed at length. The risks and benefits of unicompartmental knee arthroplasty were presented and reviewed. The risks due to aseptic loosening, infection, stiffness, patella tracking problems, thromboembolic complications and other imponderables were discussed. The patient acknowledged the explanation, agreed to proceed with the plan and consent was signed. Patient is being admitted for inpatient treatment for surgery, pain control, PT, OT, prophylactic antibiotics, VTE prophylaxis, progressive ambulation and ADL's and discharge planning. The patient is planning to be discharged home with home health services   PCP: Dr. Shawna Orleans  NO  BP/IV in right arm    Ardeen Jourdain, [place]

## 2015-11-03 ENCOUNTER — Ambulatory Visit (HOSPITAL_COMMUNITY): Payer: Medicare Other | Admitting: Certified Registered Nurse Anesthetist

## 2015-11-03 ENCOUNTER — Encounter (HOSPITAL_COMMUNITY)
Admission: RE | Disposition: A | Discharge status: Home / Self Care | Payer: Self-pay | Source: Ambulatory Visit | Attending: Orthopedic Surgery

## 2015-11-03 ENCOUNTER — Encounter (HOSPITAL_COMMUNITY): Payer: Self-pay | Admitting: *Deleted

## 2015-11-03 ENCOUNTER — Observation Stay (HOSPITAL_COMMUNITY)
Admission: RE | Admit: 2015-11-03 | Discharge: 2015-11-04 | Disposition: A | Discharge status: Home / Self Care | Payer: Medicare Other | Source: Ambulatory Visit | Attending: Orthopedic Surgery | Admitting: Orthopedic Surgery

## 2015-11-03 DIAGNOSIS — F419 Anxiety disorder, unspecified: Secondary | ICD-10-CM | POA: Insufficient documentation

## 2015-11-03 DIAGNOSIS — M1710 Unilateral primary osteoarthritis, unspecified knee: Secondary | ICD-10-CM | POA: Diagnosis present

## 2015-11-03 DIAGNOSIS — Z6839 Body mass index (BMI) 39.0-39.9, adult: Secondary | ICD-10-CM | POA: Insufficient documentation

## 2015-11-03 DIAGNOSIS — G4730 Sleep apnea, unspecified: Secondary | ICD-10-CM | POA: Insufficient documentation

## 2015-11-03 DIAGNOSIS — J45909 Unspecified asthma, uncomplicated: Secondary | ICD-10-CM | POA: Insufficient documentation

## 2015-11-03 DIAGNOSIS — Z853 Personal history of malignant neoplasm of breast: Secondary | ICD-10-CM | POA: Diagnosis not present

## 2015-11-03 DIAGNOSIS — L409 Psoriasis, unspecified: Secondary | ICD-10-CM | POA: Diagnosis not present

## 2015-11-03 DIAGNOSIS — Z96652 Presence of left artificial knee joint: Secondary | ICD-10-CM | POA: Insufficient documentation

## 2015-11-03 DIAGNOSIS — R7303 Prediabetes: Secondary | ICD-10-CM | POA: Insufficient documentation

## 2015-11-03 DIAGNOSIS — M8580 Other specified disorders of bone density and structure, unspecified site: Secondary | ICD-10-CM | POA: Insufficient documentation

## 2015-11-03 DIAGNOSIS — H3530 Unspecified macular degeneration: Secondary | ICD-10-CM | POA: Diagnosis not present

## 2015-11-03 DIAGNOSIS — M1711 Unilateral primary osteoarthritis, right knee: Secondary | ICD-10-CM | POA: Diagnosis not present

## 2015-11-03 DIAGNOSIS — G4733 Obstructive sleep apnea (adult) (pediatric): Secondary | ICD-10-CM | POA: Insufficient documentation

## 2015-11-03 DIAGNOSIS — K449 Diaphragmatic hernia without obstruction or gangrene: Secondary | ICD-10-CM | POA: Diagnosis not present

## 2015-11-03 DIAGNOSIS — Z9013 Acquired absence of bilateral breasts and nipples: Secondary | ICD-10-CM | POA: Diagnosis not present

## 2015-11-03 DIAGNOSIS — K219 Gastro-esophageal reflux disease without esophagitis: Secondary | ICD-10-CM | POA: Insufficient documentation

## 2015-11-03 DIAGNOSIS — M1990 Unspecified osteoarthritis, unspecified site: Secondary | ICD-10-CM | POA: Diagnosis not present

## 2015-11-03 DIAGNOSIS — M179 Osteoarthritis of knee, unspecified: Secondary | ICD-10-CM | POA: Diagnosis present

## 2015-11-03 DIAGNOSIS — N289 Disorder of kidney and ureter, unspecified: Secondary | ICD-10-CM | POA: Diagnosis not present

## 2015-11-03 DIAGNOSIS — Z87891 Personal history of nicotine dependence: Secondary | ICD-10-CM | POA: Diagnosis not present

## 2015-11-03 DIAGNOSIS — E039 Hypothyroidism, unspecified: Secondary | ICD-10-CM | POA: Diagnosis not present

## 2015-11-03 DIAGNOSIS — M797 Fibromyalgia: Secondary | ICD-10-CM | POA: Diagnosis not present

## 2015-11-03 DIAGNOSIS — E669 Obesity, unspecified: Secondary | ICD-10-CM | POA: Insufficient documentation

## 2015-11-03 DIAGNOSIS — E785 Hyperlipidemia, unspecified: Secondary | ICD-10-CM | POA: Diagnosis not present

## 2015-11-03 DIAGNOSIS — Z9049 Acquired absence of other specified parts of digestive tract: Secondary | ICD-10-CM | POA: Insufficient documentation

## 2015-11-03 DIAGNOSIS — K589 Irritable bowel syndrome without diarrhea: Secondary | ICD-10-CM | POA: Insufficient documentation

## 2015-11-03 DIAGNOSIS — Z79899 Other long term (current) drug therapy: Secondary | ICD-10-CM | POA: Insufficient documentation

## 2015-11-03 DIAGNOSIS — F329 Major depressive disorder, single episode, unspecified: Secondary | ICD-10-CM | POA: Insufficient documentation

## 2015-11-03 DIAGNOSIS — F5104 Psychophysiologic insomnia: Secondary | ICD-10-CM | POA: Insufficient documentation

## 2015-11-03 HISTORY — PX: PARTIAL KNEE ARTHROPLASTY: SHX2174

## 2015-11-03 LAB — TYPE AND SCREEN
ABO/RH(D): A POS
ANTIBODY SCREEN: NEGATIVE

## 2015-11-03 SURGERY — ARTHROPLASTY, KNEE, UNICOMPARTMENTAL
Anesthesia: General | Site: Knee | Laterality: Right

## 2015-11-03 MED ORDER — HYDROMORPHONE HCL 1 MG/ML IJ SOLN
0.5000 mg | INTRAMUSCULAR | Status: AC | PRN
  Administered 2015-11-03 (×4): 0.5 mg via INTRAVENOUS

## 2015-11-03 MED ORDER — DOCUSATE SODIUM 100 MG PO CAPS
100.0000 mg | ORAL_CAPSULE | Freq: Two times a day (BID) | ORAL | Status: DC
  Administered 2015-11-03 – 2015-11-04 (×2): 100 mg via ORAL

## 2015-11-03 MED ORDER — MIDAZOLAM HCL 5 MG/5ML IJ SOLN
INTRAMUSCULAR | Status: DC | PRN
  Administered 2015-11-03: 2 mg via INTRAVENOUS

## 2015-11-03 MED ORDER — PANTOPRAZOLE SODIUM 40 MG PO TBEC
80.0000 mg | DELAYED_RELEASE_TABLET | Freq: Every day | ORAL | Status: DC
  Administered 2015-11-04: 80 mg via ORAL
  Filled 2015-11-03: qty 2

## 2015-11-03 MED ORDER — TAMOXIFEN CITRATE 10 MG PO TABS
20.0000 mg | ORAL_TABLET | Freq: Every day | ORAL | Status: DC
  Administered 2015-11-04: 20 mg via ORAL
  Filled 2015-11-03: qty 2

## 2015-11-03 MED ORDER — ROCURONIUM BROMIDE 100 MG/10ML IV SOLN
INTRAVENOUS | Status: AC
  Filled 2015-11-03: qty 1

## 2015-11-03 MED ORDER — TRANEXAMIC ACID 1000 MG/10ML IV SOLN
1000.0000 mg | INTRAVENOUS | Status: AC
  Administered 2015-11-03: 1000 mg via INTRAVENOUS
  Filled 2015-11-03: qty 10

## 2015-11-03 MED ORDER — ROCURONIUM BROMIDE 100 MG/10ML IV SOLN
INTRAVENOUS | Status: DC | PRN
  Administered 2015-11-03: 25 mg via INTRAVENOUS

## 2015-11-03 MED ORDER — SODIUM CHLORIDE 0.9 % IJ SOLN
INTRAMUSCULAR | Status: DC | PRN
  Administered 2015-11-03: 30 mL

## 2015-11-03 MED ORDER — SUGAMMADEX SODIUM 200 MG/2ML IV SOLN
INTRAVENOUS | Status: AC
  Filled 2015-11-03: qty 2

## 2015-11-03 MED ORDER — SUGAMMADEX SODIUM 200 MG/2ML IV SOLN
INTRAVENOUS | Status: DC | PRN
  Administered 2015-11-03: 200 mg via INTRAVENOUS

## 2015-11-03 MED ORDER — 0.9 % SODIUM CHLORIDE (POUR BTL) OPTIME
TOPICAL | Status: DC | PRN
  Administered 2015-11-03: 1000 mL

## 2015-11-03 MED ORDER — LIDOCAINE HCL (CARDIAC) 20 MG/ML IV SOLN
INTRAVENOUS | Status: DC | PRN
  Administered 2015-11-03: 100 mg via INTRAVENOUS

## 2015-11-03 MED ORDER — METOCLOPRAMIDE HCL 10 MG PO TABS: 5.0000 mg | ORAL_TABLET | Freq: Three times a day (TID) | ORAL | Status: DC | PRN

## 2015-11-03 MED ORDER — MIDAZOLAM HCL 2 MG/2ML IJ SOLN
INTRAMUSCULAR | Status: AC
  Filled 2015-11-03: qty 2

## 2015-11-03 MED ORDER — LEVOTHYROXINE SODIUM 100 MCG PO TABS
100.0000 ug | ORAL_TABLET | Freq: Every day | ORAL | Status: DC
Start: 2015-11-04 — End: 2015-11-04
  Administered 2015-11-04: 100 ug via ORAL
  Filled 2015-11-03 (×2): qty 1

## 2015-11-03 MED ORDER — DEXAMETHASONE SODIUM PHOSPHATE 10 MG/ML IJ SOLN
10.0000 mg | Freq: Once | INTRAMUSCULAR | Status: AC
  Administered 2015-11-04: 10 mg via INTRAVENOUS
  Filled 2015-11-03: qty 1

## 2015-11-03 MED ORDER — DEXAMETHASONE SODIUM PHOSPHATE 10 MG/ML IJ SOLN
INTRAMUSCULAR | Status: AC
  Filled 2015-11-03: qty 1

## 2015-11-03 MED ORDER — PHENOL 1.4 % MT LIQD
1.0000 | OROMUCOSAL | Status: DC | PRN
  Filled 2015-11-03: qty 177

## 2015-11-03 MED ORDER — METOCLOPRAMIDE HCL 5 MG/ML IJ SOLN: 5.0000 mg | Freq: Three times a day (TID) | INTRAMUSCULAR | Status: DC | PRN

## 2015-11-03 MED ORDER — ONDANSETRON HCL 4 MG/2ML IJ SOLN: 4.0000 mg | Freq: Once | INTRAMUSCULAR | Status: DC | PRN

## 2015-11-03 MED ORDER — HYDROMORPHONE HCL 1 MG/ML IJ SOLN
INTRAMUSCULAR | Status: DC | PRN
  Administered 2015-11-03: 1 mg via INTRAVENOUS
  Administered 2015-11-03 (×2): 0.5 mg via INTRAVENOUS

## 2015-11-03 MED ORDER — LACTATED RINGERS IV SOLN
INTRAVENOUS | Status: DC | PRN
  Administered 2015-11-03 (×2): via INTRAVENOUS

## 2015-11-03 MED ORDER — CHLORHEXIDINE GLUCONATE 4 % EX LIQD
60.0000 mL | Freq: Once | CUTANEOUS | Status: DC
Start: 2015-11-03 — End: 2015-11-03

## 2015-11-03 MED ORDER — CITALOPRAM HYDROBROMIDE 20 MG PO TABS
20.0000 mg | ORAL_TABLET | Freq: Two times a day (BID) | ORAL | Status: DC
  Administered 2015-11-04: 20 mg via ORAL
  Filled 2015-11-03 (×3): qty 1

## 2015-11-03 MED ORDER — ACETAMINOPHEN 10 MG/ML IV SOLN
1000.0000 mg | Freq: Once | INTRAVENOUS | Status: AC
  Administered 2015-11-03: 1000 mg via INTRAVENOUS

## 2015-11-03 MED ORDER — CEFAZOLIN SODIUM-DEXTROSE 2-3 GM-% IV SOLR
2.0000 g | INTRAVENOUS | Status: AC
Start: 2015-11-03 — End: 2015-11-03
  Administered 2015-11-03: 2 g via INTRAVENOUS

## 2015-11-03 MED ORDER — ACETAMINOPHEN 500 MG PO TABS
1000.0000 mg | ORAL_TABLET | Freq: Four times a day (QID) | ORAL | Status: DC
  Administered 2015-11-03 – 2015-11-04 (×3): 1000 mg via ORAL
  Filled 2015-11-03 (×5): qty 2

## 2015-11-03 MED ORDER — HYDROMORPHONE HCL 2 MG/ML IJ SOLN
INTRAMUSCULAR | Status: AC
  Filled 2015-11-03: qty 1

## 2015-11-03 MED ORDER — MORPHINE SULFATE (PF) 2 MG/ML IV SOLN
1.0000 mg | INTRAVENOUS | Status: DC | PRN
  Administered 2015-11-03: 1 mg via INTRAVENOUS
  Filled 2015-11-03: qty 1

## 2015-11-03 MED ORDER — PHENYLEPHRINE HCL 10 MG/ML IJ SOLN
INTRAMUSCULAR | Status: DC | PRN
  Administered 2015-11-03 (×2): 40 ug via INTRAVENOUS

## 2015-11-03 MED ORDER — FLEET ENEMA 7-19 GM/118ML RE ENEM: 1.0000 | ENEMA | Freq: Once | RECTAL | Status: DC | PRN

## 2015-11-03 MED ORDER — ONDANSETRON HCL 4 MG/2ML IJ SOLN
INTRAMUSCULAR | Status: AC
  Filled 2015-11-03: qty 2

## 2015-11-03 MED ORDER — PHENYLEPHRINE 40 MCG/ML (10ML) SYRINGE FOR IV PUSH (FOR BLOOD PRESSURE SUPPORT)
PREFILLED_SYRINGE | INTRAVENOUS | Status: AC
  Filled 2015-11-03: qty 10

## 2015-11-03 MED ORDER — TRAMADOL HCL 50 MG PO TABS
50.0000 mg | ORAL_TABLET | Freq: Four times a day (QID) | ORAL | Status: DC | PRN
  Administered 2015-11-04: 50 mg via ORAL
  Filled 2015-11-03: qty 1

## 2015-11-03 MED ORDER — SUCCINYLCHOLINE CHLORIDE 20 MG/ML IJ SOLN
INTRAMUSCULAR | Status: DC | PRN
Start: 2015-11-03 — End: 2015-11-03
  Administered 2015-11-03: 120 mg via INTRAVENOUS

## 2015-11-03 MED ORDER — EPHEDRINE SULFATE 50 MG/ML IJ SOLN
INTRAMUSCULAR | Status: DC | PRN
  Administered 2015-11-03: 10 mg via INTRAVENOUS

## 2015-11-03 MED ORDER — CEFAZOLIN SODIUM-DEXTROSE 2-3 GM-% IV SOLR
INTRAVENOUS | Status: AC
  Filled 2015-11-03: qty 50

## 2015-11-03 MED ORDER — SODIUM CHLORIDE 0.9 % IJ SOLN
INTRAMUSCULAR | Status: AC
  Filled 2015-11-03: qty 50

## 2015-11-03 MED ORDER — FENTANYL CITRATE (PF) 100 MCG/2ML IJ SOLN
INTRAMUSCULAR | Status: AC
  Filled 2015-11-03: qty 2

## 2015-11-03 MED ORDER — BUPIVACAINE LIPOSOME 1.3 % IJ SUSP
INTRAMUSCULAR | Status: DC | PRN
  Administered 2015-11-03: 20 mL

## 2015-11-03 MED ORDER — DEXTROSE 5 % IV SOLN
500.0000 mg | Freq: Four times a day (QID) | INTRAVENOUS | Status: DC | PRN
  Administered 2015-11-03: 500 mg via INTRAVENOUS
  Filled 2015-11-03 (×2): qty 5

## 2015-11-03 MED ORDER — TEMAZEPAM 15 MG PO CAPS
15.0000 mg | ORAL_CAPSULE | Freq: Every day | ORAL | Status: DC
  Administered 2015-11-03: 15 mg via ORAL
  Filled 2015-11-03: qty 1

## 2015-11-03 MED ORDER — ONDANSETRON HCL 4 MG PO TABS: 4.0000 mg | ORAL_TABLET | Freq: Four times a day (QID) | ORAL | Status: DC | PRN

## 2015-11-03 MED ORDER — BUPIVACAINE-EPINEPHRINE (PF) 0.25% -1:200000 IJ SOLN
INTRAMUSCULAR | Status: AC
  Filled 2015-11-03: qty 30

## 2015-11-03 MED ORDER — DEXAMETHASONE SODIUM PHOSPHATE 10 MG/ML IJ SOLN
10.0000 mg | Freq: Once | INTRAMUSCULAR | Status: AC
  Administered 2015-11-03: 10 mg via INTRAVENOUS

## 2015-11-03 MED ORDER — CEFAZOLIN SODIUM-DEXTROSE 2-3 GM-% IV SOLR
2.0000 g | Freq: Four times a day (QID) | INTRAVENOUS | Status: AC
  Administered 2015-11-03 – 2015-11-04 (×2): 2 g via INTRAVENOUS
  Filled 2015-11-03 (×2): qty 50

## 2015-11-03 MED ORDER — POTASSIUM CHLORIDE IN NACL 20-0.9 MEQ/L-% IV SOLN
INTRAVENOUS | Status: DC
  Administered 2015-11-03: 19:00:00 via INTRAVENOUS
  Filled 2015-11-03 (×2): qty 1000

## 2015-11-03 MED ORDER — DIPHENHYDRAMINE HCL 12.5 MG/5ML PO ELIX: 12.5000 mg | ORAL_SOLUTION | ORAL | Status: DC | PRN

## 2015-11-03 MED ORDER — SODIUM CHLORIDE 0.9 % IV SOLN: INTRAVENOUS | Status: DC

## 2015-11-03 MED ORDER — BISACODYL 10 MG RE SUPP: 10.0000 mg | Freq: Every day | RECTAL | Status: DC | PRN

## 2015-11-03 MED ORDER — ACETAMINOPHEN 10 MG/ML IV SOLN
INTRAVENOUS | Status: AC
  Filled 2015-11-03: qty 100

## 2015-11-03 MED ORDER — ATROPINE SULFATE 0.4 MG/ML IJ SOLN
INTRAMUSCULAR | Status: AC
  Filled 2015-11-03: qty 1

## 2015-11-03 MED ORDER — ONDANSETRON HCL 4 MG/2ML IJ SOLN: 4.0000 mg | Freq: Four times a day (QID) | INTRAMUSCULAR | Status: DC | PRN

## 2015-11-03 MED ORDER — OXYCODONE HCL 5 MG PO TABS
5.0000 mg | ORAL_TABLET | ORAL | Status: DC | PRN
  Administered 2015-11-03 (×2): 5 mg via ORAL
  Administered 2015-11-04 (×3): 10 mg via ORAL
  Filled 2015-11-03: qty 1
  Filled 2015-11-03 (×3): qty 2
  Filled 2015-11-03 (×3): qty 1

## 2015-11-03 MED ORDER — BUPIVACAINE HCL (PF) 0.25 % IJ SOLN
INTRAMUSCULAR | Status: DC | PRN
  Administered 2015-11-03: 30 mL

## 2015-11-03 MED ORDER — MENTHOL 3 MG MT LOZG
1.0000 | LOZENGE | OROMUCOSAL | Status: DC | PRN
Start: 2015-11-03 — End: 2015-11-04

## 2015-11-03 MED ORDER — ACETAMINOPHEN 650 MG RE SUPP: 650.0000 mg | Freq: Four times a day (QID) | RECTAL | Status: DC | PRN

## 2015-11-03 MED ORDER — HYDROMORPHONE HCL 1 MG/ML IJ SOLN
INTRAMUSCULAR | Status: AC
  Administered 2015-11-03: 0.5 mg via INTRAVENOUS
  Filled 2015-11-03: qty 1

## 2015-11-03 MED ORDER — METHOCARBAMOL 500 MG PO TABS
500.0000 mg | ORAL_TABLET | Freq: Four times a day (QID) | ORAL | Status: DC | PRN
  Administered 2015-11-03 – 2015-11-04 (×2): 500 mg via ORAL
  Filled 2015-11-03 (×2): qty 1

## 2015-11-03 MED ORDER — LIDOCAINE HCL (CARDIAC) 20 MG/ML IV SOLN
INTRAVENOUS | Status: AC
  Filled 2015-11-03: qty 5

## 2015-11-03 MED ORDER — RIVAROXABAN 10 MG PO TABS
10.0000 mg | ORAL_TABLET | Freq: Every day | ORAL | Status: DC
  Administered 2015-11-04: 10 mg via ORAL
  Filled 2015-11-03 (×2): qty 1

## 2015-11-03 MED ORDER — SIMVASTATIN 10 MG PO TABS
10.0000 mg | ORAL_TABLET | Freq: Every day | ORAL | Status: DC
  Administered 2015-11-03: 10 mg via ORAL
  Filled 2015-11-03 (×2): qty 1

## 2015-11-03 MED ORDER — PROPOFOL 10 MG/ML IV BOLUS
INTRAVENOUS | Status: DC | PRN
  Administered 2015-11-03: 120 mg via INTRAVENOUS

## 2015-11-03 MED ORDER — FENTANYL CITRATE (PF) 100 MCG/2ML IJ SOLN
INTRAMUSCULAR | Status: DC | PRN
  Administered 2015-11-03 (×2): 50 ug via INTRAVENOUS

## 2015-11-03 MED ORDER — POLYETHYLENE GLYCOL 3350 17 G PO PACK: 17.0000 g | PACK | Freq: Every day | ORAL | Status: DC | PRN

## 2015-11-03 MED ORDER — ONDANSETRON HCL 4 MG/2ML IJ SOLN
INTRAMUSCULAR | Status: DC | PRN
  Administered 2015-11-03: 4 mg via INTRAVENOUS

## 2015-11-03 MED ORDER — SODIUM CHLORIDE 0.9 % IR SOLN
Status: DC | PRN
  Administered 2015-11-03: 1000 mL

## 2015-11-03 MED ORDER — ACETAMINOPHEN 325 MG PO TABS: 650.0000 mg | ORAL_TABLET | Freq: Four times a day (QID) | ORAL | Status: DC | PRN

## 2015-11-03 MED ORDER — HYDROMORPHONE HCL 1 MG/ML IJ SOLN
INTRAMUSCULAR | Status: AC
Start: 2015-11-03 — End: 2015-11-03
  Administered 2015-11-03: 0.5 mg via INTRAVENOUS
  Filled 2015-11-03: qty 1

## 2015-11-03 MED ORDER — BUPIVACAINE LIPOSOME 1.3 % IJ SUSP
20.0000 mL | Freq: Once | INTRAMUSCULAR | Status: DC
  Filled 2015-11-03: qty 20

## 2015-11-03 MED ORDER — PREGABALIN 75 MG PO CAPS
75.0000 mg | ORAL_CAPSULE | Freq: Every day | ORAL | Status: DC
  Administered 2015-11-03: 75 mg via ORAL
  Filled 2015-11-03: qty 1

## 2015-11-03 SURGICAL SUPPLY — 48 items
BAG DECANTER FOR FLEXI CONT (MISCELLANEOUS) ×1 IMPLANT
BAG SPEC THK2 15X12 ZIP CLS (MISCELLANEOUS)
BAG ZIPLOCK 12X15 (MISCELLANEOUS) IMPLANT
BANDAGE ELASTIC 4 VELCRO ST LF (GAUZE/BANDAGES/DRESSINGS) ×1 IMPLANT
BANDAGE ELASTIC 6 VELCRO ST LF (GAUZE/BANDAGES/DRESSINGS) ×2 IMPLANT
BLADE SAW RECIPROCATING 77.5 (BLADE) ×2 IMPLANT
BLADE SAW SGTL 13.0X1.19X90.0M (BLADE) ×2 IMPLANT
BOWL SMART MIX CTS (DISPOSABLE) ×2 IMPLANT
BUR OVAL CARBIDE 4.0 (BURR) ×2 IMPLANT
CAPT KNEE PARTIAL 2 ×2 IMPLANT
CEMENT HV SMART SET (Cement) ×2 IMPLANT
CLOTH BEACON ORANGE TIMEOUT ST (SAFETY) ×2 IMPLANT
CUFF TOURN SGL QUICK 34 (TOURNIQUET CUFF) ×2
CUFF TRNQT CYL 34X4X40X1 (TOURNIQUET CUFF) ×1 IMPLANT
DRSG ADAPTIC 3X8 NADH LF (GAUZE/BANDAGES/DRESSINGS) ×2 IMPLANT
DRSG EMULSION OIL 3X16 NADH (GAUZE/BANDAGES/DRESSINGS) ×1 IMPLANT
DRSG PAD ABDOMINAL 8X10 ST (GAUZE/BANDAGES/DRESSINGS) ×2 IMPLANT
DURAPREP 26ML APPLICATOR (WOUND CARE) ×2 IMPLANT
ELECT REM PT RETURN 9FT ADLT (ELECTROSURGICAL) ×2
ELECTRODE REM PT RTRN 9FT ADLT (ELECTROSURGICAL) ×1 IMPLANT
EVACUATOR 1/8 PVC DRAIN (DRAIN) ×2 IMPLANT
GAUZE SPONGE 4X4 12PLY STRL (GAUZE/BANDAGES/DRESSINGS) ×2 IMPLANT
GLOVE BIO SURGEON STRL SZ7.5 (GLOVE) ×3 IMPLANT
GLOVE BIO SURGEON STRL SZ8 (GLOVE) ×2 IMPLANT
GLOVE BIOGEL PI IND STRL 8 (GLOVE) ×2 IMPLANT
GLOVE BIOGEL PI INDICATOR 8 (GLOVE) ×2
GOWN STRL REUS W/TWL LRG LVL3 (GOWN DISPOSABLE) ×3 IMPLANT
GOWN STRL REUS W/TWL XL LVL3 (GOWN DISPOSABLE) ×3 IMPLANT
HANDPIECE INTERPULSE COAX TIP (DISPOSABLE) ×2
IMMOBILIZER KNEE 20 (SOFTGOODS) ×3 IMPLANT
IMMOBILIZER KNEE 20 THIGH 36 (SOFTGOODS) ×1 IMPLANT
KIT IMPL STRL TIB IPOLY IUNI IMPLANT
MANIFOLD NEPTUNE II (INSTRUMENTS) ×2 IMPLANT
PACK TOTAL KNEE CUSTOM (KITS) ×2 IMPLANT
PAD ABD 8X10 STRL (GAUZE/BANDAGES/DRESSINGS) ×1 IMPLANT
PADDING CAST COTTON 6X4 STRL (CAST SUPPLIES) ×4 IMPLANT
POSITIONER SURGICAL ARM (MISCELLANEOUS) ×2 IMPLANT
SET HNDPC FAN SPRY TIP SCT (DISPOSABLE) ×1 IMPLANT
STRIP CLOSURE SKIN 1/2X4 (GAUZE/BANDAGES/DRESSINGS) ×3 IMPLANT
SUT MNCRL AB 4-0 PS2 18 (SUTURE) ×2 IMPLANT
SUT STRATAFIX 0 PDS 27 VIOLET (SUTURE) ×2
SUT VIC AB 2-0 CT1 27 (SUTURE) ×4
SUT VIC AB 2-0 CT1 TAPERPNT 27 (SUTURE) ×2 IMPLANT
SUT VLOC 180 0 24IN GS25 (SUTURE) ×1 IMPLANT
SUTURE STRATFX 0 PDS 27 VIOLET (SUTURE) IMPLANT
SYR 50ML LL SCALE MARK (SYRINGE) ×2 IMPLANT
TRAY FOLEY W/METER SILVER 14FR (SET/KITS/TRAYS/PACK) ×1 IMPLANT
TRAY FOLEY W/METER SILVER 16FR (SET/KITS/TRAYS/PACK) ×1 IMPLANT

## 2015-11-03 NOTE — Op Note (Signed)
OPERATIVE REPORT  PREOPERATIVE DIAGNOSIS: Medial compartment osteoarthritis, Right knee  POSTOPERATIVE DIAGNOSIS: Medial compartment osteoarthritis, Right knee  PROCEDURE:Right knee medial unicompartmental arthroplasty.   SURGEON: Gaynelle Arabian, MD   ASSISTANT: Arlee Muslim, PA-C  ANESTHESIA:  General.   ESTIMATED BLOOD LOSS: Minimal.   DRAINS: Hemovac x1.   TOURNIQUET TIME: 30 minutes at 300 mmHg.   COMPLICATIONS: None.   CONDITION: Stable to recovery.   BRIEF CLINICAL NOTE:Jean Nash is a 74 y.o. female, who has  significant isolated medial compartment arthritis of the Right knee. She has had nonoperative management including injections. She has had  cortisone and viscous supplements. Unfortunately, the pain persists.  Radiograph showed isolated medial compartment bone-on-bone arthritis  with normal-appearing patellofemoral and lateral compartments. She  presents now for right knee unicompartmental arthroplasty.   PROCEDURE IN DETAIL: After successful administration of  General anesthetic, a tourniquet was placed high on the  Right thigh and right lower extremity prepped and draped in usual sterile fashion. Extremity was wrapped in an Esmarch, knee flexed, and tourniquet inflated to 300 mmHg. A midline incision was made with a 10 blade through subcutaneous  tissue to the extensor mechanism. A fresh blade was used to make a  medial parapatellar arthrotomy. Soft tissue on the proximal medial  tibia subperiosteally elevated to the joint line with a knife and into  the semimembranosus bursa with a Cobb elevator. The patella was  subluxed laterally, and the knee flexed 90 degrees. The ACL was intact.  The marginal osteophytes on the medial femur and tibia were removed with  a rongeur. The medial meniscus was also removed. The femoral cutting  block where the conformis unicompartmental knee system was placed along  the femur. There was excellent fit. I  traced the outline. We then  removed any remaining cartilage within this outline. We then placed the  cutting block again and pinned in position. The posterior femoral cut  was made, it was approximately 5 mm. The lug holes for the femoral  component were then drilled through the cutting block. The cutting  block was subsequently removed. We then utilized the high speed burr to  create a small trough at the superior aspect of the components that of  the inset and would not overhang the cartilage. The trial was placed,  it had excellent fit. The trial was subsequently removed.       The trial was placed again and the B chip was placed. There was  excellent balance throughout full motion. Also with excellent fit on  her tibia. This was removed as was the femoral trial. A curette was  used to remove any remaining cartilage from the tibia. The tibial  cutting block was then placed and there was a perfect fit on the tibial  surface. The appropriate slope was placed and it was pinned in  position. The reciprocating saw was used to make the central cut and  then the oscillating saw used to make the horizontal cut. The bone  fragment was then removed. The tibial trial was placed and had perfect  fit on the tibia. We then drilled the 2 lug holes and did the keel punch.  We then placed tibia trial femur, and a 6 mm trial insert. There was  excellent stability throughout full range of motion and no impingement.  The trial was then removed. We drilled small holes in the distal  femur in order to create more conduits for the cement. The cut bone  surfaces were thoroughly  irrigated with pulsatile lavage while the  cement was mixed on the back table. We then cemented the tibial  component into place, impacted it and removed the extruded cement. The  same was done for the femoral component. Trial 6-mm inserts placed,  knee held in full extension, and all extruded cement removed. While the  cement was  hardening, I injected the extensor mechanism, periosteum of  the femur and subcu tissues, a total of 20 mL of Exparel mixed with 30  mL of saline and then did an additional injection of 20 mL of 0.25%  Marcaine into the same tissues. When the cement had fully hardened,  then the permanent polyethylene was placed in tibial tray. There was  excellent stability throughout full range of motion with no lift off the  component and no evidence of any impingement. Wound was copiously  irrigated with saline solution, and the arthrotomy closed over a Hemovac  drain with a running #1 V-Loc suture. The subcutaneous was closed with  interrupted 2-0 Vicryl and subcuticular running 4-0 Monocryl. The drain  was hooked to suction. Incision cleaned and dried and Steri-Strips and  a bulky sterile dressing applied. The tourniquet was released after a  total time of 30 minutes. This was done after closing the extensor  mechanism. The wound was closed and a bulky sterile dressing was  applied. She was placed into a knee immobilizer, awakened and  transported to recovery room in stable condition.  Please note that a surgical assistant was a medical necessity for this  procedure in order to perform it in a safe and expeditious manner.  Assistance was necessary for retracting vital ligaments, neurovascular  structures, as well as for proper positioning of the limb to allow for  appropriate bone cuts and appropriate placement of the prosthesis.    Jean Plover Aracelly Tencza, MD

## 2015-11-03 NOTE — Interval H&P Note (Signed)
History and Physical Interval Note:  11/03/2015 1:33 PM  Jean Nash  has presented today for surgery, with the diagnosis of MEDIAL COMPARTMENT OA RIGHT KNEE   The various methods of treatment have been discussed with the patient and family. After consideration of risks, benefits and other options for treatment, the patient has consented to  Procedure(s): RIGHT KNEE MEDIAL UNICOMPARTMENTAL ARTHROPLASTY  (Right) as a surgical intervention .  The patient's history has been reviewed, patient examined, no change in status, stable for surgery.  I have reviewed the patient's chart and labs.  Questions were answered to the patient's satisfaction.     Gearlean Alf

## 2015-11-03 NOTE — Anesthesia Postprocedure Evaluation (Signed)
Anesthesia Post Note  Patient: Jean Nash  Procedure(s) Performed: Procedure(s) (LRB): RIGHT KNEE MEDIAL UNICOMPARTMENTAL ARTHROPLASTY  (Right)  Patient location during evaluation: PACU Anesthesia Type: General Level of consciousness: awake, awake and alert, oriented and patient cooperative Pain management: pain level controlled Vital Signs Assessment: post-procedure vital signs reviewed and stable Respiratory status: spontaneous breathing Cardiovascular status: blood pressure returned to baseline and stable Anesthetic complications: no    Last Vitals:  Filed Vitals:   11/03/15 1530 11/03/15 1545  BP: 126/62 121/69  Pulse: 78 81  Temp:    Resp: 15 12    Last Pain:  Filed Vitals:   11/03/15 1548  PainSc: 6                  Soren Pigman EDWARD

## 2015-11-03 NOTE — Anesthesia Procedure Notes (Signed)
Procedure Name: Intubation Date/Time: 11/03/2015 1:45 PM Performed by: Montel Clock Pre-anesthesia Checklist: Patient identified, Emergency Drugs available, Suction available, Patient being monitored and Timeout performed Patient Re-evaluated:Patient Re-evaluated prior to inductionOxygen Delivery Method: Circle system utilized Preoxygenation: Pre-oxygenation with 100% oxygen Intubation Type: IV induction Ventilation: Mask ventilation without difficulty and Oral airway inserted - appropriate to patient size Laryngoscope Size: Mac and 3 Grade View: Grade I Tube type: Oral Tube size: 7.5 mm Number of attempts: 1 Airway Equipment and Method: Stylet Placement Confirmation: ETT inserted through vocal cords under direct vision,  positive ETCO2 and breath sounds checked- equal and bilateral Secured at: 20 cm Tube secured with: Tape Dental Injury: Teeth and Oropharynx as per pre-operative assessment

## 2015-11-03 NOTE — Anesthesia Preprocedure Evaluation (Signed)
Anesthesia Evaluation  Patient identified by MRN, date of birth, ID band Patient awake    Reviewed: Allergy & Precautions, NPO status , Patient's Chart, lab work & pertinent test results  Airway Mallampati: II       Dental   Pulmonary asthma , sleep apnea , former smoker,    Pulmonary exam normal  + decreased breath sounds      Cardiovascular Normal cardiovascular exam Rhythm:Regular Rate:Normal     Neuro/Psych  Headaches, Anxiety Depression  Neuromuscular disease    GI/Hepatic hiatal hernia, GERD  ,  Endo/Other  Hypothyroidism   Renal/GU Renal disease     Musculoskeletal  (+) Arthritis , Fibromyalgia -  Abdominal   Peds  Hematology   Anesthesia Other Findings   Reproductive/Obstetrics                             Anesthesia Physical Anesthesia Plan  ASA: II  Anesthesia Plan: General   Post-op Pain Management:    Induction: Intravenous  Airway Management Planned: Oral ETT  Additional Equipment:   Intra-op Plan:   Post-operative Plan: Extubation in OR  Informed Consent: I have reviewed the patients History and Physical, chart, labs and discussed the procedure including the risks, benefits and alternatives for the proposed anesthesia with the patient or authorized representative who has indicated his/her understanding and acceptance.     Plan Discussed with: CRNA, Anesthesiologist and Surgeon  Anesthesia Plan Comments:         Anesthesia Quick Evaluation

## 2015-11-03 NOTE — Transfer of Care (Signed)
Immediate Anesthesia Transfer of Care Note  Patient: Jean Nash  Procedure(s) Performed: Procedure(s): RIGHT KNEE MEDIAL UNICOMPARTMENTAL ARTHROPLASTY  (Right)  Patient Location: PACU  Anesthesia Type:General  Level of Consciousness:  sedated, patient cooperative and responds to stimulation  Airway & Oxygen Therapy:Patient Spontanous Breathing and Patient connected to face mask oxgen  Post-op Assessment:  Report given to PACU RN and Post -op Vital signs reviewed and stable  Post vital signs:  Reviewed and stable  Last Vitals:  Filed Vitals:   11/03/15 1043  BP: 156/63  Pulse: 74  Temp: 36.6 C  Resp: 18    Complications: No apparent anesthesia complications

## 2015-11-04 ENCOUNTER — Encounter (HOSPITAL_COMMUNITY): Payer: Self-pay | Admitting: Orthopedic Surgery

## 2015-11-04 DIAGNOSIS — E785 Hyperlipidemia, unspecified: Secondary | ICD-10-CM | POA: Diagnosis not present

## 2015-11-04 DIAGNOSIS — E039 Hypothyroidism, unspecified: Secondary | ICD-10-CM | POA: Diagnosis not present

## 2015-11-04 DIAGNOSIS — M8580 Other specified disorders of bone density and structure, unspecified site: Secondary | ICD-10-CM | POA: Diagnosis not present

## 2015-11-04 DIAGNOSIS — M1711 Unilateral primary osteoarthritis, right knee: Secondary | ICD-10-CM | POA: Diagnosis not present

## 2015-11-04 DIAGNOSIS — L409 Psoriasis, unspecified: Secondary | ICD-10-CM | POA: Diagnosis not present

## 2015-11-04 DIAGNOSIS — G4733 Obstructive sleep apnea (adult) (pediatric): Secondary | ICD-10-CM | POA: Diagnosis not present

## 2015-11-04 LAB — CBC
HEMATOCRIT: 37.9 % (ref 36.0–46.0)
HEMOGLOBIN: 12.6 g/dL (ref 12.0–15.0)
MCH: 30 pg (ref 26.0–34.0)
MCHC: 33.2 g/dL (ref 30.0–36.0)
MCV: 90.2 fL (ref 78.0–100.0)
Platelets: 181 10*3/uL (ref 150–400)
RBC: 4.2 MIL/uL (ref 3.87–5.11)
RDW: 13.5 % (ref 11.5–15.5)
WBC: 13 10*3/uL — ABNORMAL HIGH (ref 4.0–10.5)

## 2015-11-04 LAB — BASIC METABOLIC PANEL
ANION GAP: 9 (ref 5–15)
BUN: 16 mg/dL (ref 6–20)
CHLORIDE: 103 mmol/L (ref 101–111)
CO2: 26 mmol/L (ref 22–32)
CREATININE: 0.61 mg/dL (ref 0.44–1.00)
Calcium: 8.6 mg/dL — ABNORMAL LOW (ref 8.9–10.3)
GFR calc non Af Amer: >60 mL/min (ref >60)
Glucose, Bld: 161 mg/dL — ABNORMAL HIGH (ref 65–99)
Potassium: 4.6 mmol/L (ref 3.5–5.1)
Sodium: 138 mmol/L (ref 135–145)

## 2015-11-04 MED ORDER — RIVAROXABAN 10 MG PO TABS: 10.0000 mg | ORAL_TABLET | Freq: Every day | ORAL | Status: DC

## 2015-11-04 MED ORDER — METHOCARBAMOL 500 MG PO TABS: 500.0000 mg | ORAL_TABLET | Freq: Four times a day (QID) | ORAL | Status: DC | PRN

## 2015-11-04 MED ORDER — TRAMADOL HCL 50 MG PO TABS: 50.0000 mg | ORAL_TABLET | Freq: Four times a day (QID) | ORAL | Status: DC | PRN

## 2015-11-04 MED ORDER — OXYCODONE HCL 5 MG PO TABS: 5.0000 mg | ORAL_TABLET | ORAL | Status: DC | PRN

## 2015-11-04 NOTE — Evaluation (Signed)
Physical Therapy Evaluation Patient Details Name: Jean Nash MRN: [id number] DOB: [birth year] Today's Date: 11/04/2015   History of Present Illness  R UKA, h/o L TKA, B mastectomy, R wrist fx  Clinical Impression  Pt ambulated 250' with RW with supervision, instructed pt in UKA exercises.  She is ready to DC from PT standpoint.    Follow Up Recommendations Home health PT    Equipment Recommendations  Rolling walker with 5" wheels    Recommendations for Other Services       Precautions / Restrictions Precautions Precautions: Knee Restrictions Weight Bearing Restrictions: No Other Position/Activity Restrictions: WBAT      Mobility  Bed Mobility Overal bed mobility: Modified Independent                Transfers Overall transfer level: Needs assistance Equipment used: Rolling walker (2 wheeled) Transfers: Sit to/from Stand Sit to Stand: Supervision         General transfer comment: verbal cues for hand placement  Ambulation/Gait Ambulation/Gait assistance: Supervision Ambulation Distance (Feet): 250 Feet Assistive device: Rolling walker (2 wheeled) Gait Pattern/deviations: Step-to pattern;Antalgic   Gait velocity interpretation: Below normal speed for age/gender General Gait Details: good sequencing, 7/10 pain R knee with walking, requested pain meds, steady with no LOB  Stairs            Wheelchair Mobility    Modified Rankin (Stroke Patients Only)       Balance Overall balance assessment: Modified Independent                                           Pertinent Vitals/Pain Pain Assessment: 0-10 Pain Score: 7  Pain Location: R knee Pain Descriptors / Indicators: Sore Pain Intervention(s): Premedicated before session;Monitored during session;Patient requesting pain meds-RN notified;Limited activity within patient's tolerance;Ice applied    Home Living Family/patient expects to be discharged to:: Private  residence Living Arrangements: Spouse/significant other Available Help at Discharge: Available 24 hours/day   Home Access: Ramped entrance     Home Layout: One level Home Equipment: Morley - single point;Wheelchair - manual;Walker - standard;Grab bars - toilet      Prior Function Level of Independence: Independent               Hand Dominance   Dominant Hand: Right    Extremity/Trunk Assessment   Upper Extremity Assessment: Overall WFL for tasks assessed           Lower Extremity Assessment: RLE deficits/detail RLE Deficits / Details: SLR 3/5, knee ext -3/5, ankle WNL, knee 0-45* AAROM    Cervical / Trunk Assessment: Normal  Communication   Communication: No difficulties  Cognition Arousal/Alertness: Awake/alert Behavior During Therapy: WFL for tasks assessed/performed Overall Cognitive Status: Within Functional Limits for tasks assessed                      General Comments      Exercises Total Joint Exercises Ankle Circles/Pumps: AROM;Both;10 reps;Supine Quad Sets: AROM;Both;5 reps;Supine Heel Slides: AAROM;Right;10 reps;Supine Straight Leg Raises: AROM;AAROM;Right;10 reps;Supine Long Arc Quad: AROM;Right;10 reps;Seated Goniometric ROM: 0-45* AAROM R knee      Assessment/Plan    PT Assessment All further PT needs can be met in the next venue of care  PT Diagnosis Difficulty walking;Acute pain   PT Problem List Decreased strength;Decreased range of motion;Pain;Decreased activity tolerance;Obesity  PT Treatment Interventions  PT Goals (Current goals can be found in the Care Plan section) Acute Rehab PT Goals Patient Stated Goal: return to swimming PT Goal Formulation: All assessment and education complete, DC therapy    Frequency     Barriers to discharge        Co-evaluation               End of Session Equipment Utilized During Treatment: Gait belt Activity Tolerance: Patient limited by pain Patient left: in chair;with  call bell/phone within reach;with chair alarm set      Functional Assessment Tool Used: clinical judgement Functional Limitation: Mobility: Walking and moving around Mobility: Walking and Moving Around Current Status (A2633): At least 1 percent but less than 20 percent impaired, limited or restricted Mobility: Walking and Moving Around Goal Status [phone]): At least 1 percent but less than 20 percent impaired, limited or restricted Mobility: Walking and Moving Around Discharge Status [phone]): At least 1 percent but less than 20 percent impaired, limited or restricted    Time: 9373-4287 PT Time Calculation (min) (ACUTE ONLY): 39 min   Charges:   PT Evaluation $Initial PT Evaluation Tier I: 1 Procedure PT Treatments $Gait Training: 8-22 mins $Therapeutic Exercise: 8-22 mins   PT G Codes:   PT G-Codes **NOT FOR INPATIENT CLASS** Functional Assessment Tool Used: clinical judgement Functional Limitation: Mobility: Walking and moving around Mobility: Walking and Moving Around Current Status (G8115): At least 1 percent but less than 20 percent impaired, limited or restricted Mobility: Walking and Moving Around Goal Status [phone]): At least 1 percent but less than 20 percent impaired, limited or restricted Mobility: Walking and Moving Around Discharge Status [phone]): At least 1 percent but less than 20 percent impaired, limited or restricted    Philomena Doheny 11/04/2015, 9:26 AM [phone]

## 2015-11-04 NOTE — Discharge Summary (Signed)
Physician Discharge Summary   [name] MRN: [id number] DOB/AGE: 72-Dec-1942 72 y.o.  Admit date: 11/03/2015 Discharge date: 11-04-2015  Primary Diagnosis:  Medial compartment osteoarthritis, Right knee Admission Diagnoses:  Past Medical History  Diagnosis Date  . Other and unspecified hyperlipidemia   . Pain in joint, shoulder region   . Irritable bowel syndrome   . Chronic insomnia   . GERD (gastroesophageal reflux disease)   . History of colonic polyps   . Fibromyalgia   . Hypothyroidism   . Other abnormal glucose   . Stress incontinence, female   . Macular degeneration   . Arthritis   . Kidney stone   . Cataract   . Depression   . Pneumonia 6433,2951  . H/O hiatal hernia   . Breast cancer ([place]) 08/08/13    right LOQ  . Hx of radiation therapy 10/29/13- 12/14/13    right chest wall 5040 cGy 28 sessions, right supraclavicular/axillary region 5040 cGy 28 sessions, right chest wall boost 1000 cGy 5 sessions  . Asthma     hx of -no inhalers, no problems  . Cluster headaches     history of migraines / NONE FOR SEVERAL YRS  . Osteopenia   . History of transfusion   . Sleep apnea     USES C-PAP  . Anxiety     PHOBIAS  . Lymphedema     RT ARM - WEARS SLEEVE   Discharge Diagnoses:   Principal Problem:   OA (osteoarthritis) of knee  Estimated body mass index is 39.13 kg/(m^2) as calculated from the following:   Height as of this encounter: 5' (1.524 m).   Weight as of this encounter: 90.89 kg (200 lb 6 oz).  Procedure:  Procedure(s) (LRB): RIGHT KNEE MEDIAL UNICOMPARTMENTAL ARTHROPLASTY  (Right)   Consults: None  HPI: [name] is a 74 y.o. female, who has  significant isolated medial compartment arthritis of the Right knee. She has had nonoperative management including injections. She has had  cortisone and viscous supplements. Unfortunately, the pain persists.  Radiograph showed isolated medial compartment bone-on-bone arthritis  with  normal-appearing patellofemoral and lateral compartments. She  presents now for right knee unicompartmental arthroplasty.  Laboratory Data: Admission on 11/03/2015  Component Date Value Ref Range Status  . WBC 11/04/2015 13.0* 4.0 - 10.5 K/uL Final  . RBC 11/04/2015 4.20  3.87 - 5.11 MIL/uL Final  . Hemoglobin 11/04/2015 12.6  12.0 - 15.0 g/dL Final  . HCT 11/04/2015 37.9  36.0 - 46.0 % Final  . MCV 11/04/2015 90.2  78.0 - 100.0 fL Final  . MCH 11/04/2015 30.0  26.0 - 34.0 pg Final  . MCHC 11/04/2015 33.2  30.0 - 36.0 g/dL Final  . RDW 11/04/2015 13.5  11.5 - 15.5 % Final  . Platelets 11/04/2015 181  150 - 400 K/uL Final  . Sodium 11/04/2015 138  135 - 145 mmol/L Final  . Potassium 11/04/2015 4.6  3.5 - 5.1 mmol/L Final  . Chloride 11/04/2015 103  101 - 111 mmol/L Final  . CO2 11/04/2015 26  22 - 32 mmol/L Final  . Glucose, Bld 11/04/2015 161* 65 - 99 mg/dL Final  . BUN 11/04/2015 16  6 - 20 mg/dL Final  . Creatinine, Ser 11/04/2015 0.61  0.44 - 1.00 mg/dL Final  . Calcium 11/04/2015 8.6* 8.9 - 10.3 mg/dL Final  . GFR calc non Af Amer 11/04/2015 >60  >60 mL/min Final  . GFR calc Af Amer 11/04/2015 >60  >60 mL/min Final  Comment: (NOTE) The eGFR has been calculated using the CKD EPI equation. This calculation has not been validated in all clinical situations. eGFR's persistently <60 mL/min signify possible Chronic Kidney Disease.   Georgiann Hahn gap 11/04/2015 9  5 - 15 Final  Hospital Outpatient Visit on 10/28/2015  Component Date Value Ref Range Status  . aPTT 10/28/2015 24  24 - 37 seconds Final  . WBC 10/28/2015 7.3  4.0 - 10.5 K/uL Final  . RBC 10/28/2015 4.72  3.87 - 5.11 MIL/uL Final  . Hemoglobin 10/28/2015 14.1  12.0 - 15.0 g/dL Final  . HCT 10/28/2015 42.3  36.0 - 46.0 % Final  . MCV 10/28/2015 89.6  78.0 - 100.0 fL Final  . MCH 10/28/2015 29.9  26.0 - 34.0 pg Final  . MCHC 10/28/2015 33.3  30.0 - 36.0 g/dL Final  . RDW 10/28/2015 13.6  11.5 - 15.5 % Final  . Platelets  10/28/2015 193  150 - 400 K/uL Final  . Sodium 10/28/2015 138  135 - 145 mmol/L Final  . Potassium 10/28/2015 4.2  3.5 - 5.1 mmol/L Final  . Chloride 10/28/2015 105  101 - 111 mmol/L Final  . CO2 10/28/2015 26  22 - 32 mmol/L Final  . Glucose, Bld 10/28/2015 115* 65 - 99 mg/dL Final  . BUN 10/28/2015 20  6 - 20 mg/dL Final  . Creatinine, Ser 10/28/2015 0.81  0.44 - 1.00 mg/dL Final  . Calcium 10/28/2015 9.1  8.9 - 10.3 mg/dL Final  . Total Protein 10/28/2015 6.8  6.5 - 8.1 g/dL Final  . Albumin 10/28/2015 3.9  3.5 - 5.0 g/dL Final  . AST 10/28/2015 26  15 - 41 U/L Final  . ALT 10/28/2015 33  14 - 54 U/L Final  . Alkaline Phosphatase 10/28/2015 63  38 - 126 U/L Final  . Total Bilirubin 10/28/2015 0.7  0.3 - 1.2 mg/dL Final  . GFR calc non Af Amer 10/28/2015 >60  >60 mL/min Final  . GFR calc Af Amer 10/28/2015 >60  >60 mL/min Final   Comment: (NOTE) The eGFR has been calculated using the CKD EPI equation. This calculation has not been validated in all clinical situations. eGFR's persistently <60 mL/min signify possible Chronic Kidney Disease.   . Anion gap 10/28/2015 7  5 - 15 Final  . Prothrombin Time 10/28/2015 13.3  11.6 - 15.2 seconds Final  . INR 10/28/2015 0.99  0.00 - 1.49 Final  . ABO/RH(D) 10/28/2015 A POS   Final  . Antibody Screen 10/28/2015 NEG   Final  . Sample Expiration 10/28/2015 11/06/2015   Final  . Extend sample reason 10/28/2015 NO TRANSFUSIONS OR PREGNANCY IN THE PAST 3 MONTHS   Final  . Color, Urine 10/28/2015 YELLOW  YELLOW Final  . APPearance 10/28/2015 CLEAR  CLEAR Final  . Specific Gravity, Urine 10/28/2015 1.011  1.005 - 1.030 Final  . pH 10/28/2015 6.0  5.0 - 8.0 Final  . Glucose, UA 10/28/2015 NEGATIVE  NEGATIVE mg/dL Final  . Hgb urine dipstick 10/28/2015 NEGATIVE  NEGATIVE Final  . Bilirubin Urine 10/28/2015 NEGATIVE  NEGATIVE Final  . Ketones, ur 10/28/2015 NEGATIVE  NEGATIVE mg/dL Final  . Protein, ur 10/28/2015 NEGATIVE  NEGATIVE mg/dL Final  .  Nitrite 10/28/2015 NEGATIVE  NEGATIVE Final  . Leukocytes, UA 10/28/2015 NEGATIVE  NEGATIVE Final   MICROSCOPIC NOT DONE ON URINES WITH NEGATIVE PROTEIN, BLOOD, LEUKOCYTES, NITRITE, OR GLUCOSE <1000 mg/dL.  Marland Kitchen MRSA, PCR 10/28/2015 NEGATIVE  NEGATIVE Final  . Staphylococcus aureus 10/28/2015 NEGATIVE  NEGATIVE Final   Comment:        The Xpert SA Assay (FDA approved for NASAL specimens in patients over 72 years of age), is one component of a comprehensive surveillance program.  Test performance has been validated by [facility] for patients greater than or equal to 27 year old. It is not intended to diagnose infection nor to guide or monitor treatment.   Appointment on 09/26/2015  Component Date Value Ref Range Status  . Color, Urine 09/26/2015 YELLOW  Yellow;Lt. Yellow Final  . APPearance 09/26/2015 CLEAR  Clear Final  . Specific Gravity, Urine 09/26/2015 1.025  1.000-1.030 Final  . pH 09/26/2015 6.0  5.0 - 8.0 Final  . Total Protein, Urine 09/26/2015 NEGATIVE  Negative Final  . Urine Glucose 09/26/2015 NEGATIVE  Negative Final  . Ketones, ur 09/26/2015 NEGATIVE  Negative Final  . Bilirubin Urine 09/26/2015 NEGATIVE  Negative Final  . Hgb urine dipstick 09/26/2015 NEGATIVE  Negative Final  . Urobilinogen, UA 09/26/2015 0.2  0.0 - 1.0 Final  . Leukocytes, UA 09/26/2015 NEGATIVE  Negative Final  . Nitrite 09/26/2015 NEGATIVE  Negative Final  . Colony Count 09/26/2015 6,000 COLONIES/ML   Final  . Organism ID, Bacteria 09/26/2015 Insignificant Growth   Final     X-Rays:No results found.  EKG: Orders placed or performed in visit on 05/22/15  . EKG 12-Lead     Hospital Course: Durinda Buzzelli is a 74 y.o. who was admitted to [facility]. They were brought to the operating room on 11/03/2015 and underwent Procedure(s): RIGHT KNEE MEDIAL UNICOMPARTMENTAL ARTHROPLASTY .  Patient tolerated the procedure well and was later transferred to the recovery room and then to the  orthopaedic floor for postoperative care.  They were given PO and IV analgesics for pain control following their surgery.  They were given 24 hours of postoperative antibiotics of  Anti-infectives    Start     Dose/Rate Route Frequency Ordered Stop   11/03/15 2000  ceFAZolin (ANCEF) IVPB 2 g/50 mL premix     2 g 100 mL/hr over 30 Minutes Intravenous Every 6 hours 11/03/15 1627 11/04/15 0235   11/03/15 1045  ceFAZolin (ANCEF) IVPB 2 g/50 mL premix     2 g 100 mL/hr over 30 Minutes Intravenous On call to O.R. 11/03/15 1045 11/03/15 1347     and started on DVT prophylaxis in the form of Xarelto.   PT and OT were ordered for postop therapy protocol.  Discharge planning consulted to help with postop disposition and equipment needs.  Patient had a good night on the evening of surgery.  They started to get up OOB with therapy on day one. Hemovac drain was pulled without difficulty.  Patient was seen in rounds on day one and it was felt that as long as they did well with the remaining sessions of therapy that they would be ready to go home.  Arrangements were made and they were setup to go home on POD 1.  Diet - Cardiac diet Follow up - in 2 weeks Activity - WBAT Dressing - May remove the surgical dressing tomorrow at home and then apply a dry gauze dressing daily. May shower three days following surgery but do not submerge the incision under water. Disposition - Home Condition Upon Discharge - Good D/C Meds - See DC Summary DVT Prophylaxis Xarelto 10 mg daily for ten days, then change to Aspirin 325 mg daily for two weeks, then reduce to Baby Aspirin 81 mg daily for  three additional weeks.  Discharge Instructions    Call MD / Call 911    Complete by:  As directed   If you experience chest pain or shortness of breath, CALL 911 and be transported to the hospital emergency room.  If you develope a fever above 101 F, pus (white drainage) or increased drainage or redness at the wound, or calf pain, call  your surgeon's office.     Change dressing    Complete by:  As directed   Change dressing daily with sterile 4 x 4 inch gauze dressing and apply TED hose. Do not submerge the incision under water.     Constipation Prevention    Complete by:  As directed   Drink plenty of fluids.  Prune juice may be helpful.  You may use a stool softener, such as Colace (over the counter) 100 mg twice a day.  Use MiraLax (over the counter) for constipation as needed.     Diet - low sodium heart healthy    Complete by:  As directed      Discharge instructions    Complete by:  As directed   Pick up stool softner and laxative for home use following surgery while on pain medications. Do not submerge incision under water. May remove the surgical dressing tomorrow, Wednesday 11/05/2015, and then apply a dry gauze dressing daily. Please use good hand washing techniques while changing dressing each day. May shower starting three days after surgery starting Thursday 11/06/2015. Please use a clean towel to pat the incision dry following showers. Continue to use ice for pain and swelling after surgery. Do not use any lotions or creams on the incision until instructed by your surgeon.  Postoperative Constipation Protocol  Constipation - defined medically as fewer than three stools per week and severe constipation as less than one stool per week.  One of the most common issues patients have following surgery is constipation. Even if you have a regular bowel pattern at home, your normal regimen is likely to be disrupted due to multiple reasons following surgery. Combination of anesthesia, postoperative narcotics, change in appetite and fluid intake all can affect your bowels. In order to avoid complications following surgery, here are some recommendations in order to help you during your recovery period.  Colace (docusate) - Pick up an over-the-counter form of Colace or another stool softener and take twice a day as  long as you are requiring postoperative pain medications. Take with a full glass of water daily. If you experience loose stools or diarrhea, hold the colace until you stool forms back up. If your symptoms do not get better within 1 week or if they get worse, check with your doctor.  Dulcolax (bisacodyl) - Pick up over-the-counter and take as directed by the product packaging as needed to assist with the movement of your bowels. Take with a full glass of water. Use this product as needed if not relieved by Colace only.   MiraLax (polyethylene glycol) - Pick up over-the-counter to have on hand. MiraLax is a solution that will increase the amount of water in your bowels to assist with bowel movements. Take as directed and can mix with a glass of water, juice, soda, coffee, or tea. Take if you go more than two days without a movement. Do not use MiraLax more than once per day. Call your doctor if you are still constipated or irregular after using this medication for 7 days in a row.  If you continue  to have problems with postoperative constipation, please contact the office for further assistance and recommendations. If you experience "the worst abdominal pain ever" or develop nausea or vomiting, please contact the office immediatly for further recommendations for treatment.  Xarelto 10 mg daily for ten days, then change to Aspirin 325 mg daily for two weeks, then reduce to Baby Aspirin 81 mg daily for three additional weeks.     Do not put a pillow under the knee. Place it under the heel.    Complete by:  As directed      Do not sit on low chairs, stoools or toilet seats, as it may be difficult to get up from low surfaces    Complete by:  As directed      Driving restrictions    Complete by:  As directed   No driving until released by the physician.     Increase activity slowly as tolerated    Complete by:  As directed      Lifting restrictions    Complete by:  As directed   No lifting  until released by the physician.     Patient may shower    Complete by:  As directed   You may shower without a dressing once there is no drainage.  Do not wash over the wound.  If drainage remains, do not shower until drainage stops.     TED hose    Complete by:  As directed   Use stockings (TED hose) for 3 weeks on both leg(s).  You may remove them at night for sleeping.     Weight bearing as tolerated    Complete by:  As directed   Laterality:  right  Extremity:  Lower            Medication List    STOP taking these medications        BENEFIBER PO     Calcium-Vitamin D-Vitamin K 500-100-40 MG-UNT-MCG Chew     Lutein-Zeaxanthin 25-5 MG Caps     meloxicam 7.5 MG tablet  Commonly known as:  MOBIC     omega-3 acid ethyl esters 1 G capsule  Commonly known as:  LOVAZA     oxyCODONE-acetaminophen 5-325 MG tablet  Commonly known as:  PERCOCET/ROXICET     Vitamin D 2000 UNITS Caps      TAKE these medications        citalopram 20 MG tablet  Commonly known as:  CELEXA  Take 1 tablet (20 mg total) by mouth daily.     docusate sodium 100 MG capsule  Commonly known as:  COLACE  Take 100 mg by mouth 2 (two) times daily.     esomeprazole 40 MG capsule  Commonly known as:  NEXIUM  Take 1 capsule by mouth  daily     levothyroxine 100 MCG tablet  Commonly known as:  SYNTHROID, LEVOTHROID  Take 1 tablet by mouth  daily     methocarbamol 500 MG tablet  Commonly known as:  ROBAXIN  Take 1 tablet (500 mg total) by mouth every 6 (six) hours as needed for muscle spasms.     oxyCODONE 5 MG immediate release tablet  Commonly known as:  Oxy IR/ROXICODONE  Take 1-2 tablets (5-10 mg total) by mouth every 3 (three) hours as needed for moderate pain or severe pain.     pregabalin 75 MG capsule  Commonly known as:  LYRICA  Take 1 capsule (75 mg total) by mouth 2 (two) times  daily.     rivaroxaban 10 MG Tabs tablet  Commonly known as:  XARELTO  Take 1 tablet (10 mg total) by  mouth daily with breakfast. Xarelto 10 mg daily for ten days, then change to Aspirin 325 mg daily for two weeks, then reduce to Baby Aspirin 81 mg daily for three additional weeks.     simvastatin 10 MG tablet  Commonly known as:  ZOCOR  Take 1 tablet (10 mg total) by mouth at bedtime.     tamoxifen 20 MG tablet  Commonly known as:  NOLVADEX  Take 1 tablet (20 mg total) by mouth daily.     temazepam 15 MG capsule  Commonly known as:  RESTORIL  Take 1 capsule (15 mg total) by mouth at bedtime as needed for sleep.     traMADol 50 MG tablet  Commonly known as:  ULTRAM  Take 1-2 tablets (50-100 mg total) by mouth every 6 (six) hours as needed (mild pain).           Follow-up Information    Follow up with Gearlean Alf, MD. Schedule an appointment as soon as possible for a visit on 11/20/2015.   Specialty:  Orthopedic Surgery   Why:  Call office at [phone] to setup appointment in two weeks on Thursday 11/20/2015 with Dr. Wynelle Link.   Contact information:   [address] [place] [address] [phone]       Signed: Arlee Muslim, PA-C Orthopaedic Surgery 11/04/2015, 8:46 AM

## 2015-11-04 NOTE — Discharge Instructions (Addendum)
° °Dr. Frank Nash °Total Joint Specialist °[facility] °[address] °[place], [place] [address] °[phone] ° °UNI KNEE REPLACEMENT POSTOPERATIVE DIRECTIONS ° ° °Knee Rehabilitation, Guidelines Following Surgery  °Results after knee surgery are often greatly improved when you follow the exercise, range of motion and muscle strengthening exercises prescribed by your doctor. Safety measures are also important to protect the knee from further injury. Any time any of these exercises cause you to have increased pain or swelling in your knee joint, decrease the amount until you are comfortable again and slowly increase them. If you have problems or questions, call your caregiver or physical therapist for advice.  ° °HOME CARE INSTRUCTIONS  °Remove items at home which could result in a fall. This includes throw rugs or furniture in walking pathways.  °· ICE to the affected knee every three hours for 30 minutes at a time and then as needed for pain and swelling.  Continue to use ice on the knee for pain and swelling from surgery. You may notice swelling that will progress down to the foot and ankle.  This is normal after surgery.  Elevate the leg when you are not up walking on it.   °· Continue to use the breathing machine which will help keep your temperature down.  It is common for your temperature to cycle up and down following surgery, especially at night when you are not up moving around and exerting yourself.  The breathing machine keeps your lungs expanded and your temperature down. °· Do not place pillow under knee, focus on keeping the knee straight while resting ° °DIET °You may resume your previous home diet once your are discharged from the hospital. ° °DRESSING / WOUND CARE / SHOWERING °You may shower 3 days after surgery, but keep the wounds dry during showering.  You may use an occlusive plastic wrap (Press'n Seal for example), NO SOAKING/SUBMERGING IN THE BATHTUB.  If the  bandage gets wet, change with a clean dry gauze.  If the incision gets wet, pat the wound dry with a clean towel. °You may start showering once you are discharged home but do not submerge the incision under water. Just pat the incision dry and apply a dry gauze dressing on daily. °Change the surgical dressing daily and reapply a dry dressing each time. ° °ACTIVITY °Walk with your walker as instructed. °Use walker as long as suggested by your caregivers. °Avoid periods of inactivity such as sitting longer than an hour when not asleep. This helps prevent blood clots.  °You may resume a sexual relationship in one month or when given the OK by your doctor.  °You may return to work once you are cleared by your doctor.  °Do not drive a car for 6 weeks or until released by you surgeon.  °Do not drive while taking narcotics. ° °WEIGHT BEARING °Weight bearing as tolerated with assist device (walker, cane, etc) as directed, use it as long as suggested by your surgeon or therapist, typically at least 4-6 weeks. ° °POSTOPERATIVE CONSTIPATION PROTOCOL °Constipation - defined medically as fewer than three stools per week and severe constipation as less than one stool per week. ° °One of the most common issues patients have following surgery is constipation.  Even if you have a regular bowel pattern at home, your normal regimen is likely to be disrupted due to multiple reasons following surgery.  Combination of anesthesia, postoperative narcotics, change in appetite and fluid intake all can affect your bowels.    In order to avoid complications following surgery, here are some recommendations in order to help you during your recovery period.  Colace (docusate) - Pick up an over-the-counter form of Colace or another stool softener and take twice a day as long as you are requiring postoperative pain medications.  Take with a full glass of water daily.  If you experience loose stools or diarrhea, hold the colace until you stool forms  back up.  If your symptoms do not get better within 1 week or if they get worse, check with your doctor.  Dulcolax (bisacodyl) - Pick up over-the-counter and take as directed by the product packaging as needed to assist with the movement of your bowels.  Take with a full glass of water.  Use this product as needed if not relieved by Colace only.   MiraLax (polyethylene glycol) - Pick up over-the-counter to have on hand.  MiraLax is a solution that will increase the amount of water in your bowels to assist with bowel movements.  Take as directed and can mix with a glass of water, juice, soda, coffee, or tea.  Take if you go more than two days without a movement. Do not use MiraLax more than once per day. Call your doctor if you are still constipated or irregular after using this medication for 7 days in a row.  If you continue to have problems with postoperative constipation, please contact the office for further assistance and recommendations.  If you experience "the worst abdominal pain ever" or develop nausea or vomiting, please contact the office immediatly for further recommendations for treatment.  ITCHING  If you experience itching with your medications, try taking only a single pain pill, or even half a pain pill at a time.  You can also use Benadryl over the counter for itching or also to help with sleep.   TED HOSE STOCKINGS Wear the elastic stockings on both legs for three weeks following surgery during the day but you may remove then at night for sleeping.  MEDICATIONS See your medication summary on the After Visit Summary that the nursing staff will review with you prior to discharge.  You may have some home medications which will be placed on hold until you complete the course of blood thinner medication.  It is important for you to complete the blood thinner medication as prescribed by your surgeon.  Continue your approved medications as instructed at time of  discharge.  PRECAUTIONS If you experience chest pain or shortness of breath - call 911 immediately for transfer to the hospital emergency department.  If you develop a fever greater that 101 F, purulent drainage from wound, increased redness or drainage from wound, foul odor from the wound/dressing, or calf pain - CONTACT YOUR SURGEON.                                                   FOLLOW-UP APPOINTMENTS Make sure you keep all of your appointments after your operation with your surgeon and caregivers. You should call the office at the above phone number and make an appointment for approximately two weeks after the date of your surgery or on the date instructed by your surgeon outlined in the "After Visit Summary".  RANGE OF MOTION AND STRENGTHENING EXERCISES  Rehabilitation of the knee is important following a knee injury or an  operation. After just a few days of immobilization, the muscles of the thigh which control the knee become weakened and shrink (atrophy). Knee exercises are designed to build up the tone and strength of the thigh muscles and to improve knee motion. Often times heat used for twenty to thirty minutes before working out will loosen up your tissues and help with improving the range of motion but do not use heat for the first two weeks following surgery. These exercises can be done on a training (exercise) mat, on the floor, on a table or on a bed. Use what ever works the best and is most comfortable for you Knee exercises include:  °Leg Lifts - While your knee is still immobilized in a splint or cast, you can do straight leg raises. Lift the leg to 60 degrees, hold for 3 sec, and slowly lower the leg. Repeat 10-20 times 2-3 times daily. Perform this exercise against resistance later as your knee gets better.  °Quad and Hamstring Sets - Tighten up the muscle on the front of the thigh (Quad) and hold for 5-10 sec. Repeat this 10-20 times hourly. Hamstring sets are done by pushing the  foot backward against an object and holding for 5-10 sec. Repeat as with quad sets.  °· Leg Slides: Lying on your back, slowly slide your foot toward your buttocks, bending your knee up off the floor (only go as far as is comfortable). Then slowly slide your foot back down until your leg is flat on the floor again. °· Angel Wings: Lying on your back spread your legs to the side as far apart as you can without causing discomfort.  °A rehabilitation program following serious knee injuries can speed recovery and prevent re-injury in the future due to weakened muscles. Contact your doctor or a physical therapist for more information on knee rehabilitation.  ° °IF YOU ARE TRANSFERRED TO A SKILLED REHAB FACILITY °If the patient is transferred to a skilled rehab facility following release from the hospital, a list of the current medications will be sent to the facility for the patient to continue.  When discharged from the skilled rehab facility, please have the facility set up the patient's Home Health Physical Therapy prior to being released. Also, the skilled facility will be responsible for providing the patient with their medications at time of release from the facility to include their pain medication, the muscle relaxants, and their blood thinner medication. If the patient is still at the rehab facility at time of the two week follow up appointment, the skilled rehab facility will also need to assist the patient in arranging follow up appointment in our office and any transportation needs. ° °MAKE SURE YOU:  °Understand these instructions.  °Get help right away if you are not doing well or get worse.  ° ° °Pick up stool softner and laxative for home use following surgery while on pain medications. °Do not submerge incision under water. °Please use good hand washing techniques while changing dressing each day. °May shower starting three days after surgery. °Please use a clean towel to pat the incision dry following  showers. °Continue to use ice for pain and swelling after surgery. °Do not use any lotions or creams on the incision until instructed by your surgeon. ° °Take Xarelto 10 mg daily for ten days, then change to Aspirin 325 mg daily for two weeks, then reduce to Baby Aspirin 81 mg daily for three additional weeks. ° ° °________________________ ° °Information on my   my medicine - XARELTO (Rivaroxaban)  This medication education was reviewed with me or my healthcare representative as part of my discharge preparation.   Why was Xarelto prescribed for you? Xarelto was prescribed for you to reduce the risk of blood clots forming after orthopedic surgery. The medical term for these abnormal blood clots is venous thromboembolism (VTE).  What do you need to know about xarelto ? Take your Xarelto ONCE DAILY at the same time every day. You may take it either with or without food.  If you have difficulty swallowing the tablet whole, you may crush it and mix in applesauce just prior to taking your dose.  Take Xarelto exactly as prescribed by your doctor and DO NOT stop taking Xarelto without talking to the doctor who prescribed the medication.  Stopping without other VTE prevention medication to take the place of Xarelto may increase your risk of developing a clot.  After discharge, you should have regular check-up appointments with your healthcare provider that is prescribing your Xarelto.    What do you do if you miss a dose? If you miss a dose, take it as soon as you remember on the same day then continue your regularly scheduled once daily regimen the next day. Do not take two doses of Xarelto on the same day.   Important Safety Information A possible side effect of Xarelto is bleeding. You should call your healthcare provider right away if you experience any of the following: ? Bleeding from an injury or your nose that does not stop. ? Unusual colored urine (red or dark brown) or unusual  colored stools (red or black). ? Unusual bruising for unknown reasons. ? A serious fall or if you hit your head (even if there is no bleeding).  Some medicines may interact with Xarelto and might increase your risk of bleeding while on Xarelto. To help avoid this, consult your healthcare provider or pharmacist prior to using any new prescription or non-prescription medications, including herbals, vitamins, non-steroidal anti-inflammatory drugs (NSAIDs) and supplements.  This website has more information on Xarelto: [email or web address]

## 2015-11-04 NOTE — Care Management Note (Signed)
Case Management Note  Patient Details  Name: Estreya Tenison MRN: [id number] Date of Birth: Jun 16, 1941  Subjective/Objective:   S/p Right knee medial unicompartmental arthroplasty.                 Action/Plan: Discharge planning, spoke with patient and spouse at bedside. Have chosen [facility] for [facility]. Contacted [facility] for referral. Needs RW, declines 3-n-1, contacted [facility] to deliver to room.  Expected Discharge Date:  11/04/15               Expected Discharge Plan:  [place]  In-House Referral:  NA  Discharge planning Services  CM Consult  Post Acute Care Choice:  Home Health, Durable Medical Equipment Choice offered to:  Patient  DME Arranged:  Walker rolling DME Agency:  [place]:  PT [place]:  [place]  Status of Service:  Completed, signed off  Medicare Important Message Given:    Date Medicare IM Given:    Medicare IM give by:    Date Additional Medicare IM Given:    Additional Medicare Important Message give by:     If discussed at [place] of Stay Meetings, dates discussed:    Additional Comments:  Guadalupe Maple, RN 11/04/2015, 11:24 AM

## 2015-11-04 NOTE — Progress Notes (Signed)
Subjective: 1 Day Post-Op Procedure(s) (LRB): RIGHT KNEE MEDIAL UNICOMPARTMENTAL ARTHROPLASTY  (Right) Patient reports pain as moderate pain last night. Better control this morning. Patient seen in rounds with Dr. Wynelle Link. Patient is well, but has had some minor complaints of pain in the knee, requiring pain medications Patient is ready to go home following therapy twice today.  Objective: Vital signs in last 24 hours: Temp:  [97.4 F (36.3 C)-98.2 F (36.8 C)] 97.6 F (36.4 C) (12/20 0411) Pulse Rate:  [62-81] 62 (12/20 0411) Resp:  [10-18] 15 (12/20 0411) BP: (109-156)/(48-71) 120/64 mmHg (12/20 0411) SpO2:  [93 %-99 %] 95 % (12/20 0411) Weight:  [90.89 kg (200 lb 6 oz)] 90.89 kg (200 lb 6 oz) (12/19 1112)  Intake/Output from previous day:  Intake/Output Summary (Last 24 hours) at 11/04/15 [id number] Last data filed at 11/04/15 0600  Gross per 24 hour  Intake 2818.75 ml  Output     50 ml  Net 2768.75 ml   Labs:  Recent Labs  11/04/15 0410  HGB 12.6    Recent Labs  11/04/15 0410  WBC 13.0*  RBC 4.20  HCT 37.9  PLT 181    Recent Labs  11/04/15 0410  NA 138  K 4.6  CL 103  CO2 26  BUN 16  CREATININE 0.61  GLUCOSE 161*  CALCIUM 8.6*   No results for input(s): LABPT, INR in the last 72 hours.  EXAM: General - Patient is Alert, Appropriate and Oriented Extremity - Neurovascular intact Sensation intact distally Dorsiflexion/Plantar flexion intact Incision - clean, dry, no drainage, healing Motor Function - intact, moving foot and toes well on exam.  Assessment/Plan: 1 Day Post-Op Procedure(s) (LRB): RIGHT KNEE MEDIAL UNICOMPARTMENTAL ARTHROPLASTY  (Right) Procedure(s) (LRB): RIGHT KNEE MEDIAL UNICOMPARTMENTAL ARTHROPLASTY  (Right) Past Medical History  Diagnosis Date  . Other and unspecified hyperlipidemia   . Pain in joint, shoulder region   . Irritable bowel syndrome   . Chronic insomnia   . GERD (gastroesophageal reflux disease)   . History  of colonic polyps   . Fibromyalgia   . Hypothyroidism   . Other abnormal glucose   . Stress incontinence, female   . Macular degeneration   . Arthritis   . Kidney stone   . Cataract   . Depression   . Pneumonia [id number]  . H/O hiatal hernia   . Breast cancer ([place]) 08/08/13    right LOQ  . Hx of radiation therapy 10/29/13- 12/14/13    right chest wall 5040 cGy 28 sessions, right supraclavicular/axillary region 5040 cGy 28 sessions, right chest wall boost 1000 cGy 5 sessions  . Asthma     hx of -no inhalers, no problems  . Cluster headaches     history of migraines / NONE FOR SEVERAL YRS  . Osteopenia   . History of transfusion   . Sleep apnea     USES C-PAP  . Anxiety     PHOBIAS  . Lymphedema     RT ARM - WEARS SLEEVE   Principal Problem:   OA (osteoarthritis) of knee  Estimated body mass index is 39.13 kg/(m^2) as calculated from the following:   Height as of this encounter: 5' (1.524 m).   Weight as of this encounter: 90.89 kg (200 lb 6 oz). Advance diet Up with therapy Discharge home with home health Diet - Cardiac diet Follow up - in 2 weeks Activity - WBAT Dressing - May remove the surgical dressing tomorrow at home and then apply  a dry gauze dressing daily. May shower three days following surgery but do not submerge the incision under water. Disposition - Home Condition Upon Discharge - Good D/C Meds - See DC Summary DVT Prophylaxis Xarelto 10 mg daily for ten days, then change to Aspirin 325 mg daily for two weeks, then reduce to Baby Aspirin 81 mg daily for three additional weeks.  Jean Muslim, PA-C Orthopaedic Surgery 11/04/2015, 7:22 AM

## 2015-11-04 NOTE — Evaluation (Signed)
Occupational Therapy Evaluation Patient Details Name: Jean Nash MRN: [id number] DOB: March 15, 1941 Today's Date: 11/04/2015    History of Present Illness R UKA, h/o L TKA, B mastectomy, R wrist fx   Clinical Impression   Patient admitted with above. Patient independent PTA. Patient currently functioning at an overall supervision to min assist level (supervision for mobility, min assist for LB ADLs).  No additional OT needs identified, D/C from acute OT services and no additional follow-up OT needs at this time. All appropriate education provided to patient and family (husband). Please re-order OT if needed.      Follow Up Recommendations  No OT follow up;Supervision - Intermittent    Equipment Recommendations  None recommended by OT    Recommendations for Other Services  None at this time   Precautions / Restrictions Precautions Precautions: Knee Precaution Comments: reviewed no pillow under knee  Restrictions Weight Bearing Restrictions: Yes RLE Weight Bearing: Weight bearing as tolerated Other Position/Activity Restrictions: WBAT    Mobility Bed Mobility Overal bed mobility: Modified Independent General bed mobility comments: Pt found seated in recliner upon OT entering/exiting room  Transfers Overall transfer level: Needs assistance Equipment used: Rolling walker (2 wheeled) Transfers: Sit to/from Stand Sit to Stand: Supervision General transfer comment: verbal cues for hand placement    Balance Overall balance assessment: Modified Independent;No apparent balance deficits (not formally assessed)    ADL Overall ADL's : Needs assistance/impaired General ADL Comments: Pt overall supervision for functional mobility and min assist for LB ADLs. Pt requires assistance with RLE LB ADLs, but working on reaching foot and increasing independence with this. Pt explained shower stall transfer technique and stated her husband will be there to assist. Pt's husband will be  available to assist post acute d/c prn.     Pertinent Vitals/Pain Pain Assessment: 0-10 Pain Score: 5  Pain Location: right knee Pain Descriptors / Indicators: Aching;Sore Pain Intervention(s): Monitored during session;Repositioned;Ice applied     Hand Dominance Right   Extremity/Trunk Assessment Upper Extremity Assessment Upper Extremity Assessment: Overall WFL for tasks assessed   Lower Extremity Assessment Lower Extremity Assessment: Defer to PT evaluation RLE Deficits / Details: SLR 3/5, knee ext -3/5, ankle WNL, knee 0-45* AAROM   Cervical / Trunk Assessment Cervical / Trunk Assessment: Normal   Communication Communication Communication: No difficulties   Cognition Arousal/Alertness: Awake/alert Behavior During Therapy: WFL for tasks assessed/performed Overall Cognitive Status: Within Functional Limits for tasks assessed              Home Living Family/patient expects to be discharged to:: Private residence Living Arrangements: Spouse/significant other Available Help at Discharge: Available 24 hours/day   Home Access: Ramped entrance     Home Layout: One level     Bathroom Shower/Tub: Walk-in shower;Tub/shower unit   Bathroom Toilet: Standard     Home Equipment: Cane - single point;Wheelchair - manual;Walker - standard;Grab bars - toilet;Toilet riser          Prior Functioning/Environment Level of Independence: Independent     OT Diagnosis: Generalized weakness;Acute pain   OT Problem List:   N/a, no acute OT needs identified at this time    OT Treatment/Interventions:   N/a, no acute OT needs identified at this time     OT Goals(Current goals can be found in the care plan section) Acute Rehab OT Goals Patient Stated Goal: go home today  OT Goal Formulation: All assessment and education complete, DC therapy  OT Frequency:   N/a, no acute OT needs  identified at this time     Barriers to D/C:  none known at this time    End of Session  Equipment Utilized During Treatment: Rolling walker  Activity Tolerance: Patient tolerated treatment well Patient left: in chair;with call bell/phone within reach;with chair alarm set;with family/visitor present   Time: 1025-1058 OT Time Calculation (min): 33 min Charges:  OT General Charges $OT Visit: 1 Procedure OT Evaluation $Initial OT Evaluation Tier I: 1 Procedure OT Treatments $Self Care/Home Management : 8-22 mins G-Codes: OT G-codes **NOT FOR INPATIENT CLASS** Functional Limitation: Self care Self Care Current Status [id number]): At least 1 percent but less than 20 percent impaired, limited or restricted Self Care Goal Status [id number]): At least 1 percent but less than 20 percent impaired, limited or restricted Self Care Discharge Status [phone]): At least 1 percent but less than 20 percent impaired, limited or restricted  Chrys Racer , MS, OTR/L, CLT Pager: [phone]  11/04/2015, 11:06 AM

## 2015-11-05 DIAGNOSIS — Z471 Aftercare following joint replacement surgery: Secondary | ICD-10-CM | POA: Diagnosis not present

## 2015-11-05 DIAGNOSIS — M797 Fibromyalgia: Secondary | ICD-10-CM | POA: Diagnosis not present

## 2015-11-05 DIAGNOSIS — M1990 Unspecified osteoarthritis, unspecified site: Secondary | ICD-10-CM | POA: Diagnosis not present

## 2015-11-05 DIAGNOSIS — F329 Major depressive disorder, single episode, unspecified: Secondary | ICD-10-CM | POA: Diagnosis not present

## 2015-11-05 DIAGNOSIS — J45909 Unspecified asthma, uncomplicated: Secondary | ICD-10-CM | POA: Diagnosis not present

## 2015-11-05 DIAGNOSIS — I890 Lymphedema, not elsewhere classified: Secondary | ICD-10-CM | POA: Diagnosis not present

## 2015-11-06 DIAGNOSIS — M797 Fibromyalgia: Secondary | ICD-10-CM | POA: Diagnosis not present

## 2015-11-06 DIAGNOSIS — M1990 Unspecified osteoarthritis, unspecified site: Secondary | ICD-10-CM | POA: Diagnosis not present

## 2015-11-06 DIAGNOSIS — J45909 Unspecified asthma, uncomplicated: Secondary | ICD-10-CM | POA: Diagnosis not present

## 2015-11-06 DIAGNOSIS — Z471 Aftercare following joint replacement surgery: Secondary | ICD-10-CM | POA: Diagnosis not present

## 2015-11-06 DIAGNOSIS — F329 Major depressive disorder, single episode, unspecified: Secondary | ICD-10-CM | POA: Diagnosis not present

## 2015-11-06 DIAGNOSIS — I890 Lymphedema, not elsewhere classified: Secondary | ICD-10-CM | POA: Diagnosis not present

## 2015-11-07 DIAGNOSIS — J45909 Unspecified asthma, uncomplicated: Secondary | ICD-10-CM | POA: Diagnosis not present

## 2015-11-07 DIAGNOSIS — F329 Major depressive disorder, single episode, unspecified: Secondary | ICD-10-CM | POA: Diagnosis not present

## 2015-11-07 DIAGNOSIS — M1990 Unspecified osteoarthritis, unspecified site: Secondary | ICD-10-CM | POA: Diagnosis not present

## 2015-11-07 DIAGNOSIS — I890 Lymphedema, not elsewhere classified: Secondary | ICD-10-CM | POA: Diagnosis not present

## 2015-11-07 DIAGNOSIS — Z471 Aftercare following joint replacement surgery: Secondary | ICD-10-CM | POA: Diagnosis not present

## 2015-11-07 DIAGNOSIS — M797 Fibromyalgia: Secondary | ICD-10-CM | POA: Diagnosis not present

## 2015-11-10 DIAGNOSIS — M797 Fibromyalgia: Secondary | ICD-10-CM | POA: Diagnosis not present

## 2015-11-10 DIAGNOSIS — I890 Lymphedema, not elsewhere classified: Secondary | ICD-10-CM | POA: Diagnosis not present

## 2015-11-10 DIAGNOSIS — Z471 Aftercare following joint replacement surgery: Secondary | ICD-10-CM | POA: Diagnosis not present

## 2015-11-10 DIAGNOSIS — F329 Major depressive disorder, single episode, unspecified: Secondary | ICD-10-CM | POA: Diagnosis not present

## 2015-11-10 DIAGNOSIS — J45909 Unspecified asthma, uncomplicated: Secondary | ICD-10-CM | POA: Diagnosis not present

## 2015-11-10 DIAGNOSIS — M1990 Unspecified osteoarthritis, unspecified site: Secondary | ICD-10-CM | POA: Diagnosis not present

## 2015-11-11 DIAGNOSIS — M797 Fibromyalgia: Secondary | ICD-10-CM | POA: Diagnosis not present

## 2015-11-11 DIAGNOSIS — Z471 Aftercare following joint replacement surgery: Secondary | ICD-10-CM | POA: Diagnosis not present

## 2015-11-11 DIAGNOSIS — M1990 Unspecified osteoarthritis, unspecified site: Secondary | ICD-10-CM | POA: Diagnosis not present

## 2015-11-11 DIAGNOSIS — J45909 Unspecified asthma, uncomplicated: Secondary | ICD-10-CM | POA: Diagnosis not present

## 2015-11-11 DIAGNOSIS — I890 Lymphedema, not elsewhere classified: Secondary | ICD-10-CM | POA: Diagnosis not present

## 2015-11-11 DIAGNOSIS — F329 Major depressive disorder, single episode, unspecified: Secondary | ICD-10-CM | POA: Diagnosis not present

## 2015-11-12 ENCOUNTER — Ambulatory Visit: Payer: Medicare Other | Admitting: Internal Medicine

## 2015-11-12 ENCOUNTER — Other Ambulatory Visit: Payer: Self-pay | Admitting: *Deleted

## 2015-11-12 ENCOUNTER — Other Ambulatory Visit: Payer: Self-pay | Admitting: Internal Medicine

## 2015-11-12 ENCOUNTER — Encounter: Payer: Self-pay | Admitting: *Deleted

## 2015-11-12 DIAGNOSIS — Z471 Aftercare following joint replacement surgery: Secondary | ICD-10-CM | POA: Diagnosis not present

## 2015-11-12 DIAGNOSIS — C50511 Malignant neoplasm of lower-outer quadrant of right female breast: Secondary | ICD-10-CM

## 2015-11-12 DIAGNOSIS — F329 Major depressive disorder, single episode, unspecified: Secondary | ICD-10-CM | POA: Diagnosis not present

## 2015-11-12 DIAGNOSIS — I890 Lymphedema, not elsewhere classified: Secondary | ICD-10-CM | POA: Diagnosis not present

## 2015-11-12 DIAGNOSIS — M797 Fibromyalgia: Secondary | ICD-10-CM | POA: Diagnosis not present

## 2015-11-12 DIAGNOSIS — J45909 Unspecified asthma, uncomplicated: Secondary | ICD-10-CM | POA: Diagnosis not present

## 2015-11-12 DIAGNOSIS — M1990 Unspecified osteoarthritis, unspecified site: Secondary | ICD-10-CM | POA: Diagnosis not present

## 2015-11-12 MED ORDER — TAMOXIFEN CITRATE 20 MG PO TABS: 20.0000 mg | ORAL_TABLET | Freq: Every day | ORAL | Status: DC

## 2015-11-12 NOTE — Telephone Encounter (Signed)
VM received from pt's husband stating need for local 30 day supply for tamoxifen as well as a 90 day refill per [facility].  Per husband states " unsure how this happened but she only has 3 tablets left and it will take at least 10 days to get it thru the mail "  Return call number given as [phone].  This RN obtained and sent a 30 day supply to requested local pharmacy as well as refilled 90 day supply per mail order.  Called to above number and obtained identified VM.  Message left with above information.

## 2015-11-16 IMAGING — US US PELVIS COMPLETE
1 series · 14 of 25 positions shown · non-contrast
Comparison: None

CLINICAL DATA: Breast cancer. Prior hysterectomy. Screening for
ovarian carcinoma.

EXAM:
TRANSABDOMINAL AND TRANSVAGINAL ULTRASOUND OF PELVIS
TECHNIQUE: Both transabdominal and transvaginal ultrasound examinations of the
pelvis were performed. Transabdominal technique was performed for
global imaging of the pelvis including uterus, ovaries, adnexal
regions, and pelvic cul-de-sac. It was necessary to proceed with
endovaginal exam following the transabdominal exam to visualize the
ovaries and adnexae.

[Series 1: us pelvis complete · 0.22mm/px · 14 of 45 slices shown]
[im 1/45]
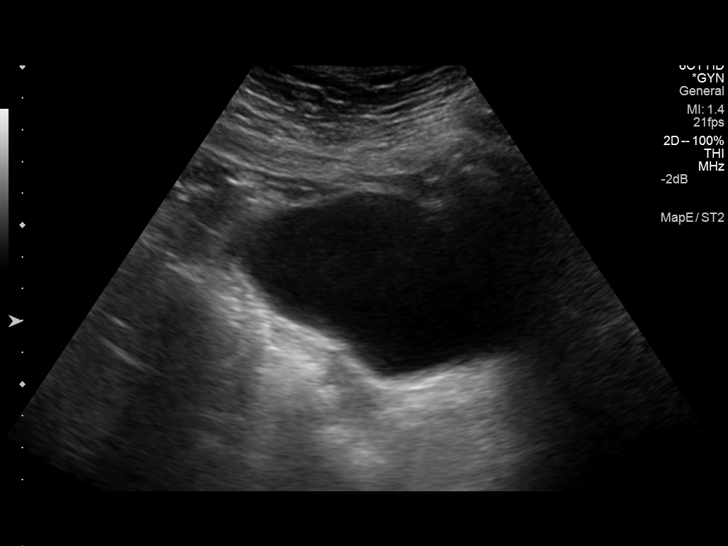
[im 4/45]
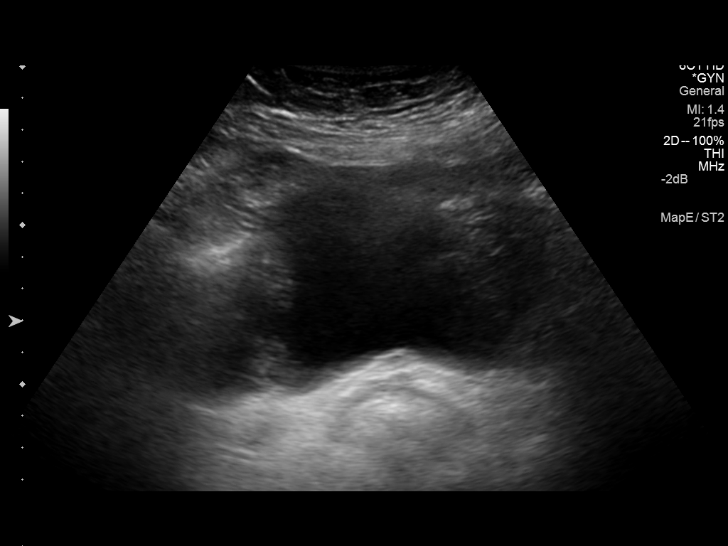
[im 8/45]
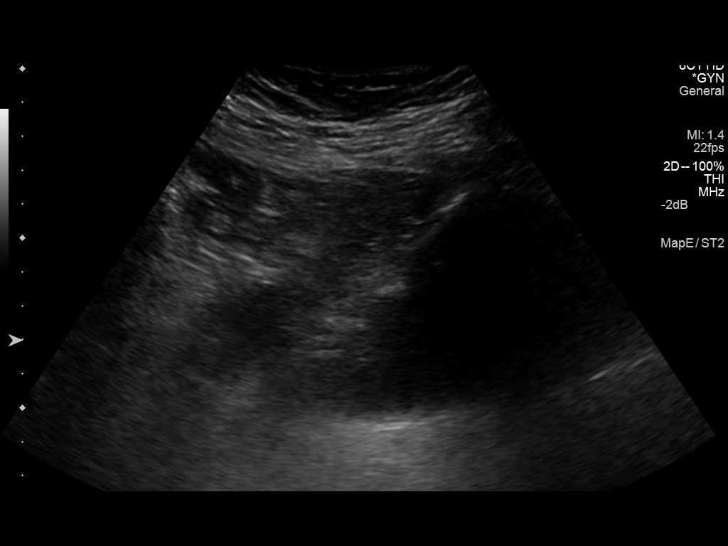
[im 12/45]
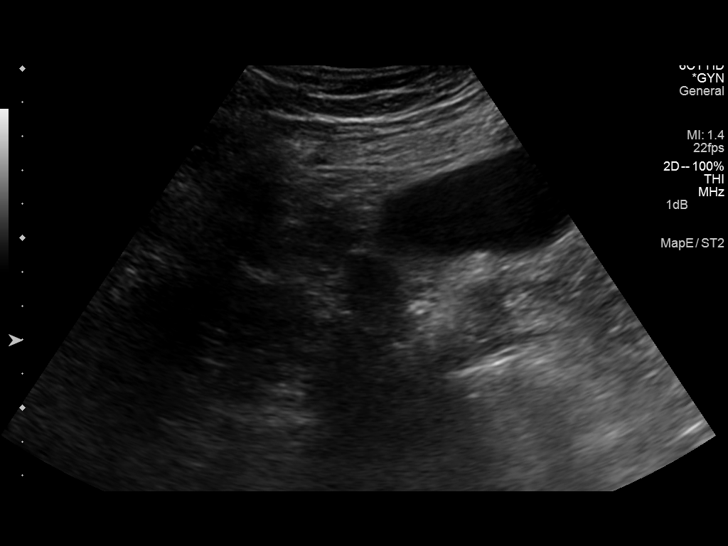
[im 15/45]
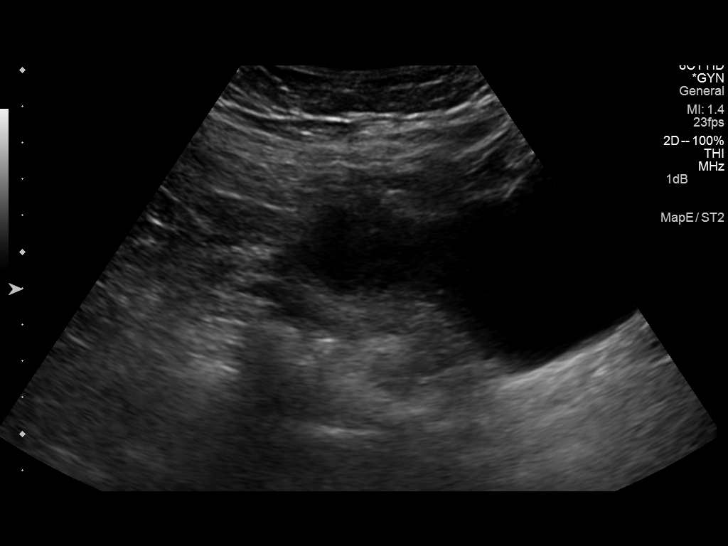
[im 17/45]
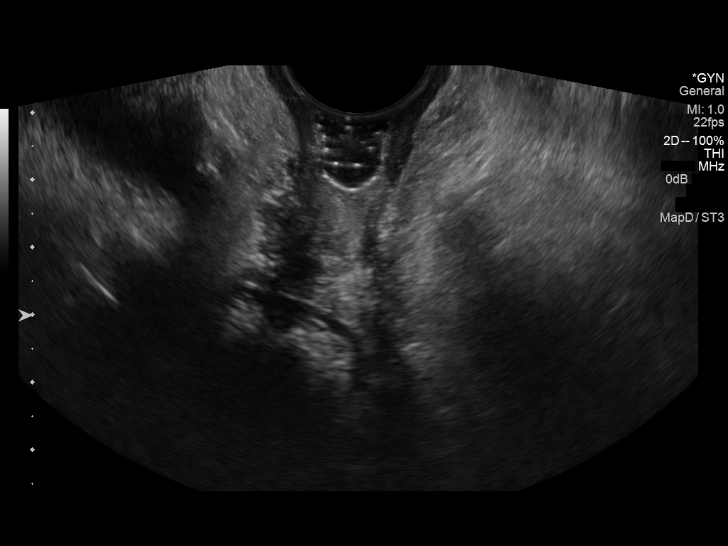
[im 21/45]
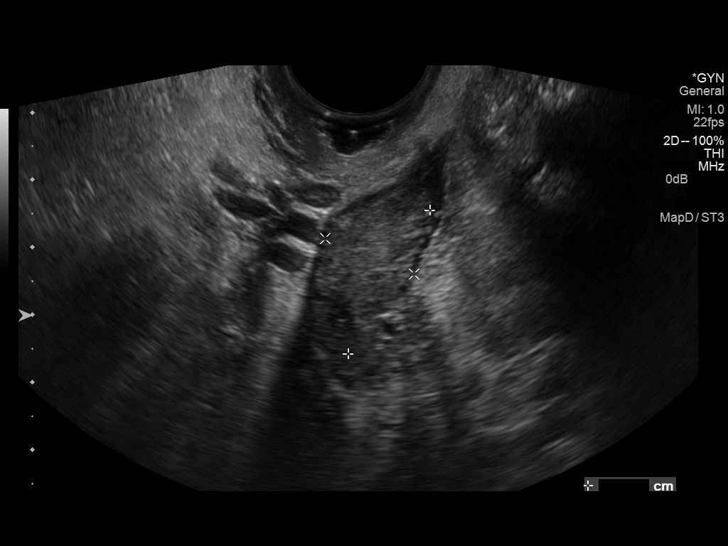
[im 24/45]
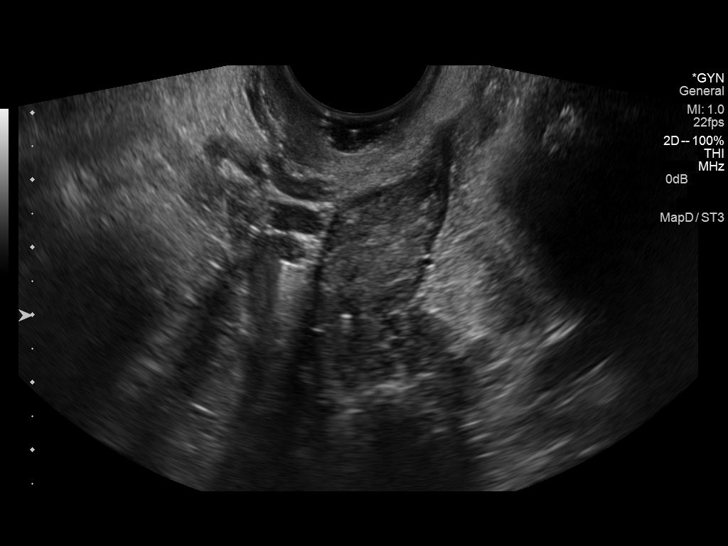
[im 28/45]
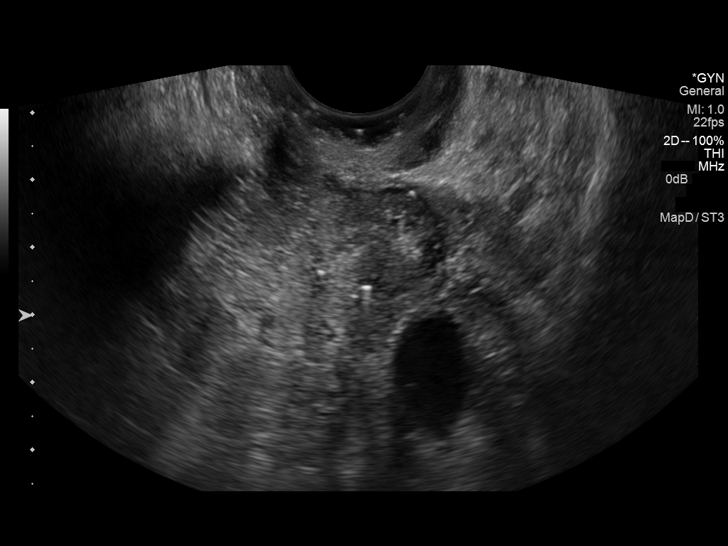
[im 30/45]
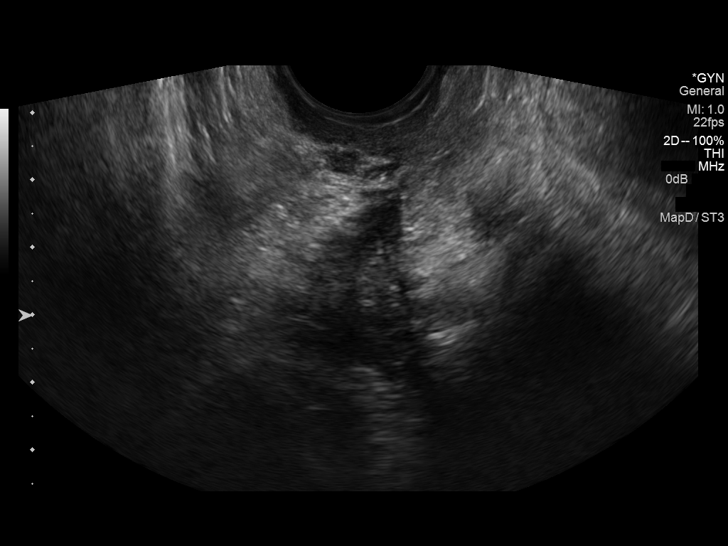
[im 34/45]
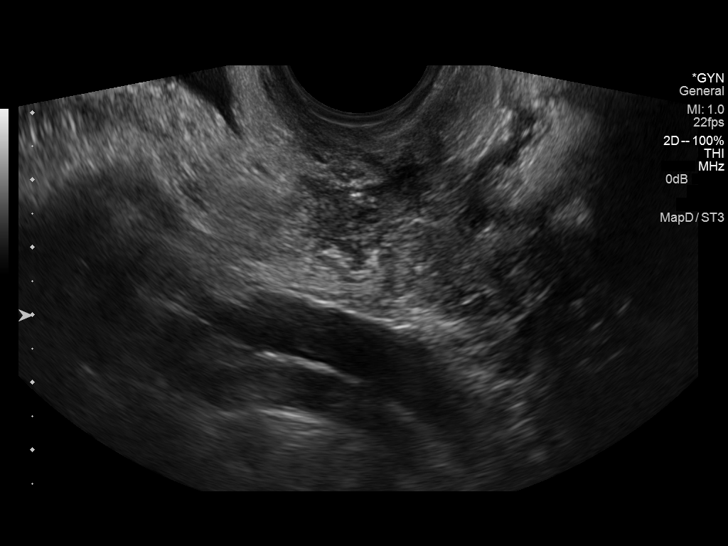
[im 37/45]
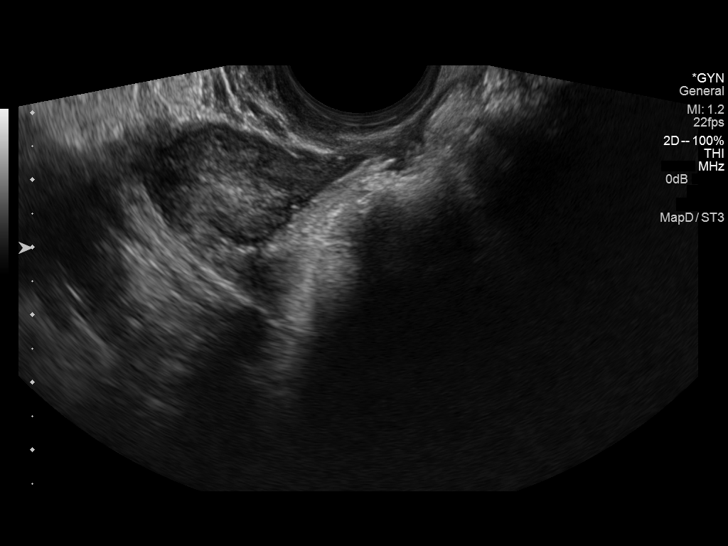
[im 41/45]
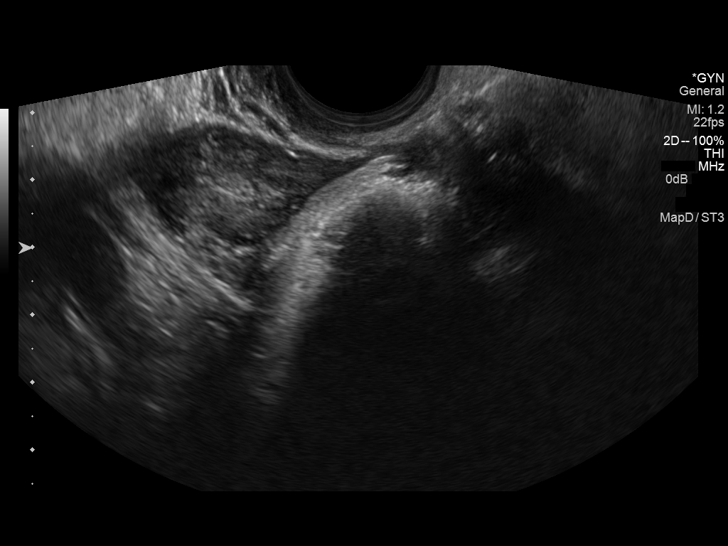
[im 45/45]
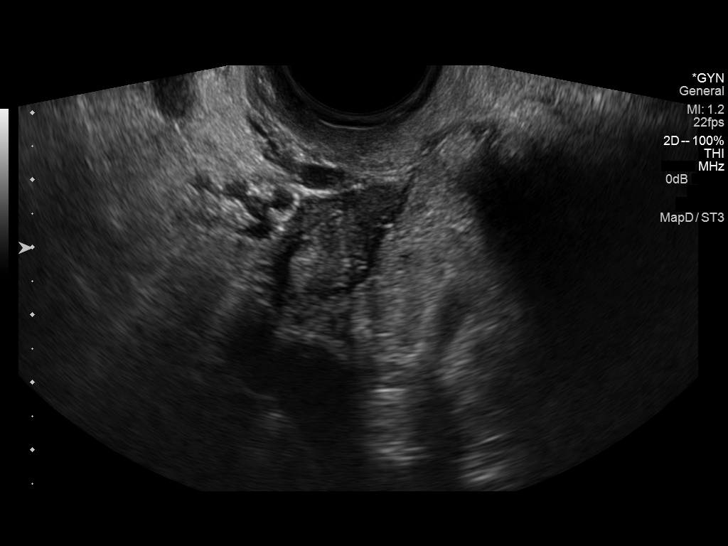

[14 of 25 positions shown; findings below may reference images not displayed]

FINDINGS: Uterus

Measurements: Surgically absent. Vaginal cuff is unremarkable in
appearance.

Endometrium

Thickness: Surgically absent.

Right ovary

Measurements: 2.6 x 1.7 x 2.4 cm. Normal appearance/no adnexal mass.

Left ovary

Measurements: 2.4 x 1.4 x 1.4 cm. Normal appearance/no adnexal mass.

Other findings

No free fluid.
IMPRESSION: Prior hysterectomy. Normal appearance of both ovaries. No ovarian or
adnexal mass identified.

## 2015-11-17 ENCOUNTER — Encounter: Payer: Self-pay | Admitting: Internal Medicine

## 2015-11-19 ENCOUNTER — Encounter (HOSPITAL_COMMUNITY): Payer: Self-pay | Admitting: Orthopedic Surgery

## 2015-11-20 DIAGNOSIS — M1711 Unilateral primary osteoarthritis, right knee: Secondary | ICD-10-CM | POA: Diagnosis not present

## 2015-11-24 ENCOUNTER — Telehealth: Payer: Self-pay | Admitting: Internal Medicine

## 2015-11-24 DIAGNOSIS — Z0000 Encounter for general adult medical examination without abnormal findings: Secondary | ICD-10-CM

## 2015-11-24 DIAGNOSIS — Z8639 Personal history of other endocrine, nutritional and metabolic disease: Secondary | ICD-10-CM

## 2015-11-24 NOTE — Telephone Encounter (Signed)
Pt said she need labs done

## 2015-11-24 NOTE — Telephone Encounter (Signed)
Labs ordered and an appointment made

## 2015-11-25 ENCOUNTER — Other Ambulatory Visit (INDEPENDENT_AMBULATORY_CARE_PROVIDER_SITE_OTHER): Payer: Medicare HMO

## 2015-11-25 DIAGNOSIS — Z8639 Personal history of other endocrine, nutritional and metabolic disease: Secondary | ICD-10-CM

## 2015-11-25 DIAGNOSIS — Z0000 Encounter for general adult medical examination without abnormal findings: Secondary | ICD-10-CM

## 2015-11-25 LAB — BASIC METABOLIC PANEL
BUN: 18 mg/dL (ref 6–23)
CHLORIDE: 101 meq/L (ref 96–112)
CO2: 29 meq/L (ref 19–32)
CREATININE: 0.62 mg/dL (ref 0.40–1.20)
Calcium: 9.2 mg/dL (ref 8.4–10.5)
GFR: 100.39 mL/min (ref >60.00)
Glucose, Bld: 129 mg/dL — ABNORMAL HIGH (ref 70–99)
POTASSIUM: 4.3 meq/L (ref 3.5–5.1)
Sodium: 138 mEq/L (ref 135–145)

## 2015-11-25 LAB — HEMOGLOBIN A1C: Hgb A1c MFr Bld: 6.2 % (ref 4.6–6.5)

## 2015-11-26 DIAGNOSIS — M1711 Unilateral primary osteoarthritis, right knee: Secondary | ICD-10-CM | POA: Diagnosis not present

## 2015-11-26 LAB — HEPATITIS C ANTIBODY: HCV Ab: NEGATIVE

## 2015-11-28 ENCOUNTER — Encounter: Payer: Self-pay | Admitting: Internal Medicine

## 2015-11-28 ENCOUNTER — Ambulatory Visit (INDEPENDENT_AMBULATORY_CARE_PROVIDER_SITE_OTHER): Payer: Medicare HMO | Admitting: Internal Medicine

## 2015-11-28 VITALS — BP 110/80 | HR 68 | Temp 98.4°F | Wt 200.0 lb

## 2015-11-28 DIAGNOSIS — G4733 Obstructive sleep apnea (adult) (pediatric): Secondary | ICD-10-CM | POA: Diagnosis not present

## 2015-11-28 DIAGNOSIS — E038 Other specified hypothyroidism: Secondary | ICD-10-CM

## 2015-11-28 DIAGNOSIS — R7303 Prediabetes: Secondary | ICD-10-CM | POA: Diagnosis not present

## 2015-11-28 DIAGNOSIS — M25561 Pain in right knee: Secondary | ICD-10-CM | POA: Diagnosis not present

## 2015-11-28 NOTE — Progress Notes (Signed)
Pre visit review using our clinic review tool, if applicable. No additional management support is needed unless otherwise documented below in the visit note. 

## 2015-11-28 NOTE — Patient Instructions (Signed)
Please complete the following lab tests before your next follow up appointment: BMET, A1c - 790.29 FLP, LFTs - 272.4 

## 2015-11-28 NOTE — Progress Notes (Signed)
Subjective:    [name], female    DOB: 08-05-1941, 75 y.o.   MRN: [id number]  HPI  75 year old white female with history of breast cancer, abnormal glucose chronic knee pain for routine follow-up. Interval medical history-patient underwent partial right knee replacement on 11/03/2015.  Patient was transiently anticoagulated with Xarelto for 10 days after surgery.  Her knee pain is gradually improving however she is still using tramadol 50-100 mg twice daily. She was originally on oxycodone but now she only takes when knee pain is severe.    Patient took oxycodone yesterday and feels somewhat groggy today.  Abnormal glucose-stable  Patient also diagnosed with OSA.  She is using her CPAP regularly  Breast cancer-followed by her oncologist. No evidence of recurrence on last visit. She continues tamoxifen/adjuvant therapy.  Wt Readings from Last 3 Encounters:  11/28/15 200 lb (90.719 kg)  11/03/15 200 lb 6 oz (90.89 kg)  10/28/15 200 lb 6 oz (90.89 kg)    Lab Results  Component Value Date   HGBA1C 6.2 11/25/2015   Lab Results  Component Value Date   TSH 1.51 04/30/2015    Review of Systems Negative for chest pain or shortness of breath    Past Medical History  Diagnosis Date  . Other and unspecified hyperlipidemia   . Pain in joint, shoulder region   . Irritable bowel syndrome   . Chronic insomnia   . GERD (gastroesophageal reflux disease)   . History of colonic polyps   . Fibromyalgia   . Hypothyroidism   . Other abnormal glucose   . Stress incontinence, female   . Macular degeneration   . Arthritis   . Kidney stone   . Cataract   . Depression   . Pneumonia [id number]  . H/O hiatal hernia   . Breast cancer ([place]) 08/08/13    right LOQ  . Hx of radiation therapy 10/29/13- 12/14/13    right chest wall 5040 cGy 28 sessions, right supraclavicular/axillary region 5040 cGy 28 sessions, right chest wall boost 1000 cGy 5 sessions  . Asthma     hx of -no  inhalers, no problems  . Cluster headaches     history of migraines / NONE FOR SEVERAL YRS  . Osteopenia   . History of transfusion   . Sleep apnea     USES C-PAP  . Anxiety     PHOBIAS  . Lymphedema     RT ARM - WEARS SLEEVE    Social History   Social History  . Marital Status: Married    Spouse Name: N/A  . Number of Children: 3  . Years of Education: N/A   Occupational History  . retired Radiation protection practitioner    Social History Main Topics  . Smoking status: Former Smoker -- 0.10 packs/day for 2 years    Types: Cigarettes    Start date: 11/16/1959    Quit date: 11/15/1960  . Smokeless tobacco: Never Used  . Alcohol Use: No  . Drug Use: No  . Sexual Activity: Not on file     Comment: menarche age 77, fist live birth 89, P 3, hysterectomy age 15, no HRT, BCP 2 yrs   Other Topics Concern  . Not on file   Social History Narrative   Occupation:  Retired Radiation protection practitioner    Married with 3 grown children      Never Smoked     Alcohol use-no         Past Surgical History  Procedure  Laterality Date  . Appendectomy    . Cholecystectomy    . Abdominal hysterectomy    . Tonsillectomy    . Eye surgery      to repair macular hole  . Cataract extraction, bilateral    . Knee arthroscopy      > 10 years ago  . Joint replacement  03/15/11    left knee replacement  . Colonoscopy    . Ganglion cyst excision      rt foot  . Foot arthroplasty      lt   . Mass excision  11/04/2011    Procedure: EXCISION MASS;  Surgeon: Cammie Sickle., MD;  Location: [facility];  Service: Orthopedics;  Laterality: Right;  excisional biopsy right ulna mass  . Hemorrhoid surgery      03/1993  . Breast cyst aspiration      9 cysts  . Toe surgery      preventative crossover toe surg/right foot  . Toe surgery      left foot/screw  in 2nd toe  . Upper gastrointestinal endoscopy    . Skin tags removed      breast, panty line, neckline  . Bilateral total mastectomy with axillary lymph  node dissection  08/30/2013    Dr Barry Dienes  . Simple mastectomy with axillary sentinel node biopsy Left 08/30/2013    Procedure: Bilateral Breast Mastectomy ;  Surgeon: Stark Klein, MD;  Location: [facility];  Service: General;  Laterality: Left;  Marland Kitchen Mastectomy w/ sentinel node biopsy Right 08/30/2013    Procedure: RIGHT  AXILLARY SENTINEL LYMPH NODE BIOPSY; Right Axillary Node Disection;  Surgeon: Stark Klein, MD;  Location: [facility];  Service: General;  Laterality: Right;  Right side nuc med 7:00   . Evacuation breast hematoma Left 08/31/2013    Procedure: EVACUATION HEMATOMA BREAST;  Surgeon: Stark Klein, MD;  Location: [facility];  Service: General;  Laterality: Left;  . Partial knee arthroplasty Right 11/03/2015    Procedure: RIGHT KNEE MEDIAL UNICOMPARTMENTAL ARTHROPLASTY ;  Surgeon: Gaynelle Arabian, MD;  Location: [facility];  Service: Orthopedics;  Laterality: Right;    Family History  Problem Relation Age of Onset  . Stroke Mother     died age 62  . Diabetes Mother   . Breast cancer Mother 50  . Breast cancer Sister 18  . Breast cancer Paternal Aunt 74  . Diabetes Maternal Grandfather   . Breast cancer Paternal Grandmother 87  . Breast cancer Paternal Aunt     dx in her 56s  . Cancer Maternal Grandmother     intra-abdominal cancer  . Brain cancer Maternal Uncle 8  . Brain cancer Cousin 21    maternal cousin  . Brain cancer Cousin 20    paternal cousin    No Known Allergies  Current Outpatient Prescriptions on File Prior to Visit  Medication Sig Dispense Refill  . citalopram (CELEXA) 20 MG tablet Take 1 tablet by mouth  daily 90 tablet 1  . docusate sodium (COLACE) 100 MG capsule Take 100 mg by mouth 2 (two) times daily.    Marland Kitchen esomeprazole (NEXIUM) 40 MG capsule Take 1 capsule by mouth  daily 90 capsule 0  . levothyroxine (SYNTHROID, LEVOTHROID) 100 MCG tablet Take 1 tablet by mouth  daily 90 tablet 0  . pregabalin (LYRICA) 75 MG capsule Take 1 capsule (75 mg total) by mouth 2 (two)  times daily. (Patient taking differently: Take 75 mg by mouth at bedtime. ) 180  capsule 1  . simvastatin (ZOCOR) 10 MG tablet Take 1 tablet by mouth at  bedtime 90 tablet 1  . tamoxifen (NOLVADEX) 20 MG tablet Take 1 tablet (20 mg total) by mouth daily. 30 tablet 1  . temazepam (RESTORIL) 15 MG capsule Take 1 capsule (15 mg total) by mouth at bedtime as needed for sleep. (Patient taking differently: Take 15 mg by mouth at bedtime. ) 90 capsule 1  . traMADol (ULTRAM) 50 MG tablet Take 1-2 tablets (50-100 mg total) by mouth every 6 (six) hours as needed (mild pain). 80 tablet 1  . oxyCODONE (OXY IR/ROXICODONE) 5 MG immediate release tablet Take 1-2 tablets (5-10 mg total) by mouth every 3 (three) hours as needed for moderate pain or severe pain. (Patient not taking: Reported on 11/28/2015) 30 tablet 0   No current facility-administered medications on file prior to visit.    BP 110/80 mmHg  Pulse 68  Temp(Src) 98.4 F (36.9 C) (Oral)  Wt 200 lb (90.719 kg)    Objective:   Physical Exam  Constitutional: She is oriented to person, place, and time. She appears well-developed and well-nourished. No distress.  Cardiovascular: Normal rate, regular rhythm and normal heart sounds.   No murmur heard. Pulmonary/Chest: Effort normal and breath sounds normal. She has no wheezes.  Musculoskeletal:  Right lower extremity +1 pitting edema.  No redness  Neurological: She is alert and oriented to person, place, and time. No cranial nerve deficit.  Skin: Skin is warm and dry.  Psychiatric: She has a normal mood and affect. Her behavior is normal.       Assessment & Plan:    1.  Right knee pain - S/P right partial knee replacement 2.  Abnormal glucose 3.  OSA 4.  Hyperlipidemia 5.  Hypothyroidism  Patient encouraged to discontinue oxycodone as it is likely affecting her mental status. Patient also encouraged to start tapering tramadol.  Her A1c is stable.  Continue management with diet and  exercise.  If A1c trends upward, restart metformin.  Patient encouraged to keep using her CPAP regularly.  Obtain blood work before next office visit.

## 2015-12-01 DIAGNOSIS — M1711 Unilateral primary osteoarthritis, right knee: Secondary | ICD-10-CM | POA: Diagnosis not present

## 2015-12-03 DIAGNOSIS — M1711 Unilateral primary osteoarthritis, right knee: Secondary | ICD-10-CM | POA: Diagnosis not present

## 2015-12-05 DIAGNOSIS — M1711 Unilateral primary osteoarthritis, right knee: Secondary | ICD-10-CM | POA: Diagnosis not present

## 2015-12-08 DIAGNOSIS — M1711 Unilateral primary osteoarthritis, right knee: Secondary | ICD-10-CM | POA: Diagnosis not present

## 2015-12-10 DIAGNOSIS — M1711 Unilateral primary osteoarthritis, right knee: Secondary | ICD-10-CM | POA: Diagnosis not present

## 2015-12-15 DIAGNOSIS — M1711 Unilateral primary osteoarthritis, right knee: Secondary | ICD-10-CM | POA: Diagnosis not present

## 2015-12-16 DIAGNOSIS — Z471 Aftercare following joint replacement surgery: Secondary | ICD-10-CM | POA: Diagnosis not present

## 2015-12-16 DIAGNOSIS — Z96651 Presence of right artificial knee joint: Secondary | ICD-10-CM | POA: Diagnosis not present

## 2015-12-18 DIAGNOSIS — M1711 Unilateral primary osteoarthritis, right knee: Secondary | ICD-10-CM | POA: Diagnosis not present

## 2015-12-19 ENCOUNTER — Encounter: Payer: Self-pay | Admitting: Internal Medicine

## 2015-12-19 ENCOUNTER — Other Ambulatory Visit: Payer: Self-pay | Admitting: Internal Medicine

## 2015-12-19 MED ORDER — LEVOTHYROXINE SODIUM 100 MCG PO TABS: 100.0000 ug | ORAL_TABLET | Freq: Every day | ORAL | Status: DC

## 2015-12-19 NOTE — Telephone Encounter (Signed)
Refill sent.

## 2015-12-19 NOTE — Addendum Note (Signed)
Addended by: Westley Hummer B on: 12/19/2015 02:53 PM   Modules accepted: Orders

## 2015-12-22 MED ORDER — TEMAZEPAM 15 MG PO CAPS: 15.0000 mg | ORAL_CAPSULE | Freq: Every evening | ORAL | Status: DC | PRN

## 2016-01-24 ENCOUNTER — Encounter: Payer: Self-pay | Admitting: Internal Medicine

## 2016-01-26 ENCOUNTER — Ambulatory Visit (INDEPENDENT_AMBULATORY_CARE_PROVIDER_SITE_OTHER): Payer: Medicare HMO | Admitting: Internal Medicine

## 2016-01-26 ENCOUNTER — Encounter: Payer: Self-pay | Admitting: Internal Medicine

## 2016-01-26 VITALS — BP 124/72 | HR 62 | Ht 60.0 in | Wt 201.0 lb

## 2016-01-26 DIAGNOSIS — G4733 Obstructive sleep apnea (adult) (pediatric): Secondary | ICD-10-CM

## 2016-01-26 MED ORDER — OMEGA-3-ACID ETHYL ESTERS 1 G PO CAPS
3.0000 g | ORAL_CAPSULE | Freq: Every day | ORAL | Status: DC
Start: 2016-01-26 — End: 2016-05-20

## 2016-01-26 MED ORDER — SIMVASTATIN 10 MG PO TABS: ORAL_TABLET | ORAL | Status: DC

## 2016-01-26 MED ORDER — LEVOTHYROXINE SODIUM 100 MCG PO TABS: 100.0000 ug | ORAL_TABLET | Freq: Every day | ORAL | Status: DC

## 2016-01-26 MED ORDER — CITALOPRAM HYDROBROMIDE 20 MG PO TABS: ORAL_TABLET | ORAL | Status: DC

## 2016-01-26 MED ORDER — PREGABALIN 75 MG PO CAPS: 75.0000 mg | ORAL_CAPSULE | Freq: Two times a day (BID) | ORAL | Status: DC

## 2016-01-26 MED ORDER — ESOMEPRAZOLE MAGNESIUM 40 MG PO CPDR: DELAYED_RELEASE_CAPSULE | ORAL | Status: DC

## 2016-01-26 MED ORDER — MELOXICAM 7.5 MG PO TABS: 7.5000 mg | ORAL_TABLET | Freq: Every day | ORAL | Status: DC

## 2016-01-26 MED ORDER — TEMAZEPAM 15 MG PO CAPS: 15.0000 mg | ORAL_CAPSULE | Freq: Every evening | ORAL | Status: DC | PRN

## 2016-01-26 MED ORDER — DOXEPIN HCL 3 MG PO TABS: ORAL_TABLET | ORAL | Status: DC

## 2016-01-26 NOTE — Patient Instructions (Signed)
We can continue CPAP auto 8-15/ [facility]  Try Silenor for sleep instead of temazepam  Please call as needed

## 2016-01-26 NOTE — Progress Notes (Signed)
06/25/15- 3 yoF negligible smoker referred courtesy of Dr Chaney Born.Had home sleep study-printed and attached to consult sheet. Unattended Home Sleep Test 03/06/2015- severe obstructive sleep apnea/hypopnea syndrome, AHI 44.9 per hour with desaturation to 74% and mean saturation only 89% on room air. Body weight 190 pounds   [facility] 4/24 Medical problems including hypothyroidism, chronic insomnia, asthma, GERD, history breast cancer R, Husband here   Daughter is pediatrician in [place] She gives long history as a short sleeper averaging 3-5 hours per night with difficulty initiating and maintaining sleep on a chronic basis. Currently using Restoril 15 mg with a little benefit. Bedtime between 11 and midnight estimating 15 minutes sleep latency during which time she counts downward from 102 or 3 times. Waking 3 or 4 times before and getting up between 6 and 8 AM. ENT surgery tonsils. Remote history of asthma she says has resolved. Bilateral mastectomy after breast cancer. Fibromyalgia.  09/26/15- 75 year old female nonsmoker followed for obstructive sleep apnea CPAP auto started 06/25/15/              husband here FOLLOWS FOR: DME: [facility]; set up with CPAP machine since last OV-DL from [employer] printed.  Download confirms good compliance with CPAP 5-20 auto/[facility]. She complains of restlessness at night, waking frequently. Her Fit bit tells her she gets 3 or 4 hours of sleep Tries Restoril but it doesn't sustain sleep  01/26/2016-75 year old female nonsmoker followed for obstructive sleep apnea, insomnia CPAP auto 8-15/[facility]    ROS-see HPI   Negative unless "+" Constitutional:    weight loss, night sweats, fevers, chills, fatigue, lassitude. HEENT:    headaches, difficulty swallowing, +tooth/dental problems, sore throat,       sneezing, itching, ear ache, nasal congestion, post nasal drip, snoring CV:    chest pain, orthopnea, PND, swelling in lower extremities, anasarca,                                                   dizziness, palpitations Resp:   +shortness of breath with exertion or at rest.                productive cough,   non-productive cough, coughing up of blood.              change in color of mucus.  wheezing.   Skin:    rash or lesions. GI:     +heartburn, indigestion, abdominal pain, nausea, vomiting, diarrhea,                 change in bowel habits, loss of appetite GU: dysuria, change in color of urine, no urgency or frequency.   flank pain. MS:   +joint pain, stiffness, decreased range of motion, back pain. Neuro-     nothing unusual Psych:  change in mood or affect.  depression or anxiety.   memory loss.  OBJ- Physical Exam General- Alert, Oriented, Affect-appropriate, Distress- none acute, + obese Skin- rash-none, lesions- none, excoriation- none Lymphadenopathy- none Head- atraumatic            Eyes- Gross vision intact, PERRLA, conjunctivae and secretions clear            Ears- Hearing, canals-normal            Nose- Clear, no-Septal dev, mucus, polyps, erosion, perforation  Throat- Mallampati III-IV , mucosa clear , drainage- none, tonsils- atrophic Neck- flexible , trachea midline, no stridor , thyroid nl, carotid no bruit Chest - symmetrical excursion , unlabored           Heart/CV- RRR , no murmur , no gallop  , no rub, nl s1 s2                           - JVD- none , edema- none, stasis changes- none, varices- none           Lung- clear to P&A, wheeze- none, cough- none , dullness-none, rub- none           Chest wall- + bilateral mastectomy Abd-  Br/ Gen/ Rectal- Not done, not indicated Extrem- cyanosis- none, clubbing, none, atrophy- none, strength- nl Neuro- grossly intact to observation

## 2016-01-27 ENCOUNTER — Ambulatory Visit: Payer: Medicare HMO | Admitting: Podiatry

## 2016-01-27 ENCOUNTER — Encounter: Payer: Medicare HMO | Admitting: Podiatry

## 2016-01-27 ENCOUNTER — Other Ambulatory Visit: Payer: Self-pay | Admitting: Family Medicine

## 2016-01-27 ENCOUNTER — Encounter: Payer: Self-pay | Admitting: Podiatry

## 2016-01-27 MED ORDER — OMEGA-3-ACID ETHYL ESTERS 1 G PO CAPS: ORAL_CAPSULE | ORAL | Status: DC

## 2016-01-27 NOTE — Telephone Encounter (Signed)
Sent to the pharmacy by e-scribe. 

## 2016-01-27 NOTE — Progress Notes (Signed)
This encounter was created in error - please disregard.

## 2016-01-29 ENCOUNTER — Encounter: Payer: Self-pay | Admitting: Internal Medicine

## 2016-01-30 MED ORDER — MELOXICAM 7.5 MG PO TABS: 7.5000 mg | ORAL_TABLET | Freq: Every day | ORAL | Status: DC

## 2016-01-30 MED ORDER — ESOMEPRAZOLE MAGNESIUM 40 MG PO CPDR
DELAYED_RELEASE_CAPSULE | ORAL | Status: DC
Start: 2016-01-30 — End: 2017-01-26

## 2016-01-30 MED ORDER — LEVOTHYROXINE SODIUM 100 MCG PO TABS: 100.0000 ug | ORAL_TABLET | Freq: Every day | ORAL | Status: DC

## 2016-01-30 MED ORDER — SIMVASTATIN 10 MG PO TABS: ORAL_TABLET | ORAL | Status: DC

## 2016-01-30 MED ORDER — CITALOPRAM HYDROBROMIDE 20 MG PO TABS: ORAL_TABLET | ORAL | Status: DC

## 2016-02-02 ENCOUNTER — Telehealth: Payer: Self-pay | Admitting: *Deleted

## 2016-02-02 DIAGNOSIS — G4733 Obstructive sleep apnea (adult) (pediatric): Secondary | ICD-10-CM | POA: Diagnosis not present

## 2016-02-02 NOTE — Telephone Encounter (Signed)
[employer] faxed a note stating Citalopram 20mg  interacts with Silenor 3mg .  Please call [phone]

## 2016-02-02 NOTE — Telephone Encounter (Signed)
See note below-(computer error) [id number]

## 2016-02-05 ENCOUNTER — Encounter: Payer: Self-pay | Admitting: Podiatry

## 2016-02-05 ENCOUNTER — Telehealth: Payer: Self-pay | Admitting: Internal Medicine

## 2016-02-05 ENCOUNTER — Ambulatory Visit (INDEPENDENT_AMBULATORY_CARE_PROVIDER_SITE_OTHER): Payer: Medicare HMO | Admitting: Podiatry

## 2016-02-05 DIAGNOSIS — M79676 Pain in unspecified toe(s): Secondary | ICD-10-CM | POA: Diagnosis not present

## 2016-02-05 DIAGNOSIS — B351 Tinea unguium: Secondary | ICD-10-CM

## 2016-02-05 NOTE — Telephone Encounter (Signed)
lmtcb for pt.  

## 2016-02-05 NOTE — Telephone Encounter (Signed)
Sharyn Lull from [facility] home delivery call to say that the pt is taking medicine that create a drug drug interaction    citalopram (CELEXA) 20 MG tablet     ,SILENOR    On 01/26/16 Silenor was ordered by Dr Baird Lyons    [phone] ref number [id number]

## 2016-02-05 NOTE — Telephone Encounter (Signed)
Please forward note to Dr. Annamaria Boots.  Pt should consider taking lowest dose of doxepin 3 mg or 1/2 of 3 or 1.5 mg.

## 2016-02-05 NOTE — Telephone Encounter (Signed)
Notify patient     Her insurance is concerned about potential drug interaction between Silenor/ doxepin for sleep and her celexa.  Recommend- reduce Silenor dose to 1/2 tab at bedtime for sleep if needed

## 2016-02-06 NOTE — Progress Notes (Signed)
She presents today with a chief complaint of painful elongated toenails 1 through 5 bilateral.  Objective: Vital signs are stable alert and oriented 3. Pulses are palpable. Her toenails are thick yellow dystrophic onychomycotic painful on palpation 1 through 5 bilateral.  Assessment: Pain in limb secondary to onychomycosis 1 through 5 bilateral.  Plan: Discussed etiology pathology conservative versus surgical therapies. Debrided toenails 1 through 5 bilateral covered service secondary to pain.

## 2016-03-02 ENCOUNTER — Other Ambulatory Visit: Payer: Self-pay

## 2016-03-02 DIAGNOSIS — C50511 Malignant neoplasm of lower-outer quadrant of right female breast: Secondary | ICD-10-CM

## 2016-03-03 ENCOUNTER — Other Ambulatory Visit (HOSPITAL_BASED_OUTPATIENT_CLINIC_OR_DEPARTMENT_OTHER): Payer: Medicare HMO

## 2016-03-03 DIAGNOSIS — C50511 Malignant neoplasm of lower-outer quadrant of right female breast: Secondary | ICD-10-CM

## 2016-03-03 LAB — COMPREHENSIVE METABOLIC PANEL
ALT: 28 U/L (ref 0–55)
AST: 18 U/L (ref 5–34)
Albumin: 3.3 g/dL — ABNORMAL LOW (ref 3.5–5.0)
Alkaline Phosphatase: 63 U/L (ref 40–150)
Anion Gap: 10 mEq/L (ref 3–11)
BUN: 12.8 mg/dL (ref 7.0–26.0)
CHLORIDE: 107 meq/L (ref 98–109)
CO2: 22 meq/L (ref 22–29)
Calcium: 9 mg/dL (ref 8.4–10.4)
Creatinine: 0.8 mg/dL (ref 0.6–1.1)
EGFR: 70 mL/min/{1.73_m2} — AB (ref >90)
GLUCOSE: 199 mg/dL — AB (ref 70–140)
POTASSIUM: 4.3 meq/L (ref 3.5–5.1)
SODIUM: 139 meq/L (ref 136–145)
TOTAL PROTEIN: 6.2 g/dL — AB (ref 6.4–8.3)
Total Bilirubin: 0.48 mg/dL (ref 0.20–1.20)

## 2016-03-03 LAB — CBC WITH DIFFERENTIAL/PLATELET
BASO%: 0.1 % (ref 0.0–2.0)
Basophils Absolute: 0 10*3/uL (ref 0.0–0.1)
EOS%: 3.6 % (ref 0.0–7.0)
Eosinophils Absolute: 0.3 10*3/uL (ref 0.0–0.5)
HCT: 41.6 % (ref 34.8–46.6)
HGB: 13.8 g/dL (ref 11.6–15.9)
LYMPH#: 1.6 10*3/uL (ref 0.9–3.3)
LYMPH%: 23.5 % (ref 14.0–49.7)
MCH: 29.6 pg (ref 25.1–34.0)
MCHC: 33.2 g/dL (ref 31.5–36.0)
MCV: 89.3 fL (ref 79.5–101.0)
MONO#: 0.5 10*3/uL (ref 0.1–0.9)
MONO%: 7.5 % (ref 0.0–14.0)
NEUT%: 65.3 % (ref 38.4–76.8)
NEUTROS ABS: 4.5 10*3/uL (ref 1.5–6.5)
Platelets: 178 10*3/uL (ref 145–400)
RBC: 4.66 10*6/uL (ref 3.70–5.45)
RDW: 14.3 % (ref 11.2–14.5)
WBC: 7 10*3/uL (ref 3.9–10.3)

## 2016-03-10 ENCOUNTER — Telehealth: Payer: Self-pay | Admitting: Oncology

## 2016-03-10 ENCOUNTER — Ambulatory Visit (HOSPITAL_BASED_OUTPATIENT_CLINIC_OR_DEPARTMENT_OTHER): Payer: Medicare HMO | Admitting: Oncology

## 2016-03-10 VITALS — BP 128/54 | HR 67 | Temp 97.8°F | Resp 18 | Ht 60.0 in | Wt 199.8 lb

## 2016-03-10 DIAGNOSIS — M25561 Pain in right knee: Secondary | ICD-10-CM

## 2016-03-10 DIAGNOSIS — C50511 Malignant neoplasm of lower-outer quadrant of right female breast: Secondary | ICD-10-CM | POA: Diagnosis not present

## 2016-03-10 DIAGNOSIS — N393 Stress incontinence (female) (male): Secondary | ICD-10-CM

## 2016-03-10 DIAGNOSIS — M8580 Other specified disorders of bone density and structure, unspecified site: Secondary | ICD-10-CM | POA: Diagnosis not present

## 2016-03-10 DIAGNOSIS — L659 Nonscarring hair loss, unspecified: Secondary | ICD-10-CM

## 2016-03-10 MED ORDER — TAMOXIFEN CITRATE 20 MG PO TABS: 20.0000 mg | ORAL_TABLET | Freq: Every day | ORAL | Status: DC

## 2016-03-10 NOTE — Progress Notes (Signed)
ID: Jean Nash OB: [birth year]  MR#: [id number]  CSN#:[id number]  PCP: Drema Pry, DO GYN:  Marylynn Pearson SU: Stark Klein OTHER MD: Arloa Koh, Delrae Rend, Scott MacDiarmid, Venancio Poisson, Jolayne Panther Supple  CHIEF COMPLAINT: Estrogen receptor positive breast cancer   CURRENT TREATMENT: Tamoxifen   BREAST CANCER HISTORY: From the original intake note:   Jean Nash had routine screening mammography a08/18/2014 showing no suspicious findings. However she was found to have breast density category C. She researched this, was alarmed by the fact that mammographic sensitivity is significantly decreased when the breasts are dense, and given her family history of breast cancer she opted for proceeding to bilateral breast MRI. This was performed at [facility] 08/01/2013. It showed in the right breast an area of nodular and linear enhancement spanning approximately 8.4 cm. There were no abnormal appearing lymph nodes and the left breast was unremarkable.  Biopsy of an area around the middle of the abnormal section of the right breast on 08/08/2013, showed ([id number]) an invasive and in situ ductal carcinoma, the invasive tumor being grade 1, estrogen receptor 90% positive, progesterone receptor 90% positive, with an MIB-105%. HER-2 testing is pending.  The patient's subsequent history is as detailed below   INTERVAL HISTORY: Jean Nash returns today for followup of her estrogen receptor positive breast cancer accompanied by her husband Jean Nash.  She takes tamoxifen daily, with good tolerance. Hot flashes are much less of a problem. She doesn't have any vaginal wetness. She obtains a drug at a very good price.  REVIEW OF SYSTEMS: The biggest problem she is having is pain in her right knee. She had a partial replacement therapy December 2016, and it's taken a long time for it to heal. She walks about 3000 steps a day max. She would like to walk [phone]. A second problem is that she is  losing her hair. This is particularly noticeable on the crown. She has a lot of insomnia issues, has been diagnosed with sleep apnea, and uses her CPAP. She is sleeping about 3-4 hours a night as per her 50s bit. She naps during the day 30-40 minutes and that helps. She is having currently some sinus symptoms including a little bit of a dry cough. She has been a little bit depressed because right after [place] is when her dad died and she remembers the loss. Otherwise she is doing "pretty good" and a detailed review of systems today was otherwise stable  PAST MEDICAL HISTORY: Past Medical History  Diagnosis Date  . Other and unspecified hyperlipidemia   . Pain in joint, shoulder region   . Irritable bowel syndrome   . Chronic insomnia   . GERD (gastroesophageal reflux disease)   . History of colonic polyps   . Fibromyalgia   . Hypothyroidism   . Other abnormal glucose   . Stress incontinence, female   . Macular degeneration   . Arthritis   . Kidney stone   . Cataract   . Depression   . Pneumonia 1308,6578  . H/O hiatal hernia   . Breast cancer ([facility]) 08/08/13    right LOQ  . Hx of radiation therapy 10/29/13- 12/14/13    right chest wall 5040 cGy 28 sessions, right supraclavicular/axillary region 5040 cGy 28 sessions, right chest wall boost 1000 cGy 5 sessions  . Asthma     hx of -no inhalers, no problems  . Cluster headaches     history of migraines / NONE FOR SEVERAL YRS  . Osteopenia   .  History of transfusion   . Sleep apnea     USES C-PAP  . Anxiety     PHOBIAS  . Lymphedema     RT ARM - WEARS SLEEVE    PAST SURGICAL HISTORY: Past Surgical History  Procedure Laterality Date  . Appendectomy    . Cholecystectomy    . Abdominal hysterectomy    . Tonsillectomy    . Eye surgery      to repair macular hole  . Cataract extraction, bilateral    . Knee arthroscopy      > 10 years ago  . Joint replacement  03/15/11    left knee replacement  . Colonoscopy    . Ganglion  cyst excision      rt foot  . Foot arthroplasty      lt   . Mass excision  11/04/2011    Procedure: EXCISION MASS;  Surgeon: Cammie Sickle., MD;  Location: [facility];  Service: Orthopedics;  Laterality: Right;  excisional biopsy right ulna mass  . Hemorrhoid surgery      03/1993  . Breast cyst aspiration      9 cysts  . Toe surgery      preventative crossover toe surg/right foot  . Toe surgery      left foot/screw  in 2nd toe  . Upper gastrointestinal endoscopy    . Skin tags removed      breast, panty line, neckline  . Bilateral total mastectomy with axillary lymph node dissection  08/30/2013    Dr Barry Dienes  . Simple mastectomy with axillary sentinel node biopsy Left 08/30/2013    Procedure: Bilateral Breast Mastectomy ;  Surgeon: Stark Klein, MD;  Location: [facility];  Service: General;  Laterality: Left;  Marland Kitchen Mastectomy w/ sentinel node biopsy Right 08/30/2013    Procedure: RIGHT  AXILLARY SENTINEL LYMPH NODE BIOPSY; Right Axillary Node Disection;  Surgeon: Stark Klein, MD;  Location: [facility];  Service: General;  Laterality: Right;  Right side nuc med 7:00   . Evacuation breast hematoma Left 08/31/2013    Procedure: EVACUATION HEMATOMA BREAST;  Surgeon: Stark Klein, MD;  Location: [facility];  Service: General;  Laterality: Left;  . Partial knee arthroplasty Right 11/03/2015    Procedure: RIGHT KNEE MEDIAL UNICOMPARTMENTAL ARTHROPLASTY ;  Surgeon: Gaynelle Arabian, MD;  Location: [facility];  Service: Orthopedics;  Laterality: Right;    FAMILY HISTORY Family History  Problem Relation Age of Onset  . Stroke Mother     died age 68  . Diabetes Mother   . Breast cancer Mother 39  . Breast cancer Sister 28  . Breast cancer Paternal Aunt 38  . Diabetes Maternal Grandfather   . Breast cancer Paternal Grandmother 31  . Breast cancer Paternal Aunt     dx in her 59s  . Cancer Maternal Grandmother     intra-abdominal cancer  . Brain cancer Maternal Uncle 8  . Brain cancer  Cousin 43    maternal cousin  . Brain cancer Cousin 20    paternal cousin   the patient's father lived to be 5. The patient's mother was diagnosed with breast cancer at age 23. She died at 32 from unrelated causes. The patient had no brothers, one sister there are multiple second degree relatives with breast cancer, but no history of ovarian cancer in the family.  GYNECOLOGIC HISTORY:  Menarche age 89, first live birth age 69, she is [place] P3. She had a hysterectomy at age 51.  She did not take hormone replacement. She took birth control perhaps for 2 years, in the mid-21s. She did not have any complications from that.  SOCIAL HISTORY:  She is a retired Radiation protection practitioner. Her husband Jean Nash used to be a Heritage manager. Daughter Jean Nash is a pediatrician in [place]. Daughter Jean Nash is a Engineer, water in [place]. Daughter Jean Nash is  a therapist living in [place]. The patient has 12 grandchildren. She attends the local syn[employer]    ADVANCED DIRECTIVES: In place   HEALTH MAINTENANCE: Social History  Substance Use Topics  . Smoking status: Former Smoker -- 0.10 packs/day for 2 years    Types: Cigarettes    Start date: 11/16/1959    Quit date: 11/15/1960  . Smokeless tobacco: Never Used  . Alcohol Use: No     Colonoscopy: April 2014 PAP: Status post hysterectomy  Bone density: January 2014 Lipid panel:  No Known Allergies  Current Outpatient Prescriptions  Medication Sig Dispense Refill  . Calcium Carb-Cholecalciferol (CALCIUM-VITAMIN D) 500-400 MG-UNIT TABS Take by mouth.    . citalopram (CELEXA) 20 MG tablet Take 1 tablet by mouth  daily 90 tablet 1  . docusate sodium (COLACE) 100 MG capsule Take 100 mg by mouth 3 (three) times daily.     . Doxepin HCl (SILENOR) 3 MG TABS 1 at bedtime for sleep 30 tablet 5  . esomeprazole (NEXIUM) 40 MG capsule Take 1 capsule by mouth  daily 90 capsule 1  . levothyroxine (SYNTHROID, LEVOTHROID) 100 MCG tablet Take 1 tablet (100  mcg total) by mouth daily before breakfast. 90 tablet 1  . LUTEIN PO Take by mouth.    . meloxicam (MOBIC) 7.5 MG tablet Take 1 tablet (7.5 mg total) by mouth daily. 90 tablet 1  . meloxicam (MOBIC) 7.5 MG tablet Take 1 tablet (7.5 mg total) by mouth daily. 90 tablet 1  . methocarbamol (ROBAXIN) 500 MG tablet Take 500 mg by mouth every 6 (six) hours as needed for muscle spasms.    Marland Kitchen omega-3 acid ethyl esters (LOVAZA) 1 g capsule Take 3 capsules (3 g total) by mouth daily. 90 capsule 1  . omega-3 acid ethyl esters (LOVAZA) 1 g capsule Take 1 capsule by mouth  twice daily 180 capsule 0  . Omega-3-acid Ethyl Esters (LOVAZA PO) Take by mouth.    . pregabalin (LYRICA) 75 MG capsule Take 1 capsule (75 mg total) by mouth 2 (two) times daily. 180 capsule 1  . simvastatin (ZOCOR) 10 MG tablet Take 1 tablet by mouth at  bedtime 90 tablet 1  . tamoxifen (NOLVADEX) 20 MG tablet Take 1 tablet (20 mg total) by mouth daily. 30 tablet 1  . temazepam (RESTORIL) 15 MG capsule Take 1 capsule (15 mg total) by mouth at bedtime as needed for sleep. 90 capsule 1  . traMADol (ULTRAM) 50 MG tablet Take 1-2 tablets (50-100 mg total) by mouth every 6 (six) hours as needed (mild pain). 80 tablet 1  . Wheat Dextrin (BENEFIBER PO) Take by mouth.     No current facility-administered medications for this visit.    OBJECTIVE: Middle-aged white womanWho appears stated age 75 Vitals:   03/10/16 0909  BP: 128/54  Pulse: 67  Temp: 97.8 F (36.6 C)  Resp: 18     Body mass index is 39.02 kg/(m^2).    ECOG FS:1 - Symptomatic but completely ambulatory  Sclerae unicteric, pupils round and equal Oropharynx clear and moist-- no thrush or other lesions No cervical or  supraclavicular adenopathy Lungs no rales or rhonchi Heart regular rate and rhythm Abd soft, obese, nontender, positive bowel sounds MSK no focal spinal tenderness, no upper extremity lymphedema Neuro: nonfocal, well oriented, appropriate affect Breasts:  Status post bilateral mastectomies. There is no evidence of local recurrence. Both axillae are benign.    LAB RESULTS:  CMP     Component Value Date/Time   NA 139 03/03/2016 0923   NA 138 11/25/2015 1003   K 4.3 03/03/2016 0923   K 4.3 11/25/2015 1003   CL 101 11/25/2015 1003   CO2 22 03/03/2016 0923   CO2 29 11/25/2015 1003   GLUCOSE 199* 03/03/2016 0923   GLUCOSE 129* 11/25/2015 1003   BUN 12.8 03/03/2016 0923   BUN 18 11/25/2015 1003   CREATININE 0.8 03/03/2016 0923   CREATININE 0.62 11/25/2015 1003   CREATININE 0.70 02/22/2014 1655   CALCIUM 9.0 03/03/2016 0923   CALCIUM 9.2 11/25/2015 1003   PROT 6.2* 03/03/2016 0923   PROT 6.8 10/28/2015 1510   ALBUMIN 3.3* 03/03/2016 0923   ALBUMIN 3.9 10/28/2015 1510   AST 18 03/03/2016 0923   AST 26 10/28/2015 1510   ALT 28 03/03/2016 0923   ALT 33 10/28/2015 1510   ALKPHOS 63 03/03/2016 0923   ALKPHOS 63 10/28/2015 1510   BILITOT 0.48 03/03/2016 0923   BILITOT 0.7 10/28/2015 1510   GFRNONAA >60 11/04/2015 0410   GFRAA >60 11/04/2015 0410    I No results found for: SPEP  Lab Results  Component Value Date   WBC 7.0 03/03/2016   NEUTROABS 4.5 03/03/2016   HGB 13.8 03/03/2016   HCT 41.6 03/03/2016   MCV 89.3 03/03/2016   PLT 178 03/03/2016      Chemistry      Component Value Date/Time   NA 139 03/03/2016 0923   NA 138 11/25/2015 1003   K 4.3 03/03/2016 0923   K 4.3 11/25/2015 1003   CL 101 11/25/2015 1003   CO2 22 03/03/2016 0923   CO2 29 11/25/2015 1003   BUN 12.8 03/03/2016 0923   BUN 18 11/25/2015 1003   CREATININE 0.8 03/03/2016 0923   CREATININE 0.62 11/25/2015 1003   CREATININE 0.70 02/22/2014 1655      Component Value Date/Time   CALCIUM 9.0 03/03/2016 0923   CALCIUM 9.2 11/25/2015 1003   ALKPHOS 63 03/03/2016 0923   ALKPHOS 63 10/28/2015 1510   AST 18 03/03/2016 0923   AST 26 10/28/2015 1510   ALT 28 03/03/2016 0923   ALT 33 10/28/2015 1510   BILITOT 0.48 03/03/2016 0923   BILITOT 0.7  10/28/2015 1510      No results found for: LABCA2  No components found for: [id number]  No results for input(s): INR in the last 168 hours.  Urinalysis    Component Value Date/Time   COLORURINE YELLOW 10/28/2015 1356   APPEARANCEUR CLEAR 10/28/2015 1356   LABSPEC 1.011 10/28/2015 1356   PHURINE 6.0 10/28/2015 1356   GLUCOSEU NEGATIVE 10/28/2015 1356   GLUCOSEU NEGATIVE 09/26/2015 1231   HGBUR NEGATIVE 10/28/2015 1356   BILIRUBINUR NEGATIVE 10/28/2015 1356   BILIRUBINUR neg 08/13/2015 [place] 10/28/2015 1356   PROTEINUR NEGATIVE 10/28/2015 1356   PROTEINUR neg 08/13/2015 1653   UROBILINOGEN 0.2 09/26/2015 1231   UROBILINOGEN 0.2 08/13/2015 1653   NITRITE NEGATIVE 10/28/2015 1356   NITRITE positive 08/13/2015 1653   LEUKOCYTESUR NEGATIVE 10/28/2015 1356    STUDIES: No results found.   ASSESSMENT: 75 y.o. BRCA negative  woman status  post right breast lower outer quadrant biopsy 08/08/2013 for a clinical T2 N0, stage IIA invasive ductal carcinoma, grade 1, estrogen receptor 90% positive, progesterone receptor 90% positive, with an MIB-1 of 12% and HER-2 negative  (1) status post bilateral mastectomies with right axillary lymph node dissection 08/30/2013 showing a right-sided pT1a pN2, stage IIIA invasive ductal carcinoma, grade 1, with negative margins  (2) given the marginal benefit in terms of mortality risk reduction from adjuvant chemotherapy, the patient decided to forego that option  (3) adjuvant radiation completed 12/14/2013  (4) tamoxifen started February 2015  (5) osteopenia with a T score of -2.2 on bone density January 2014  (6) carries a VUS in [place] [id number]  PLAN: Jiah Is now 2-1/2 years out from her definitive surgery with no evidence of disease recurrence. This is very favorable.  She is tolerating the tamoxifen well. The hot flashes have "burned out" so that is much less of a problem.  She is having a little bit  of hair loss. This could be related to the tamoxifen are could be related to any number of other issues including other medications. I think she can go on [place] 10 and see if that helps. She has some nail problems as well and that could help with that.  Her biggest issue is the right knee. She is following up on that with Dr. Wynelle Link. The second biggest issue is stress urinary incontinence. I put in a referral to Dr. Matilde Sprang and see if he can be of help. She tells me she has tried myrbetriq in the past and it did not help  She is going to see me again in 6 months. At that point she will be 3 years out from her surgery and I'll start seeing her on a once a year basis MAGRINAT,GUSTAV C, MD   03/10/2016 9:11 AM

## 2016-03-10 NOTE — Telephone Encounter (Signed)
appt made and avs printed. Dr Matilde Sprang appt set for 5/3 at 1015 am at [address]

## 2016-03-16 ENCOUNTER — Telehealth: Payer: Self-pay | Admitting: Oncology

## 2016-03-16 DIAGNOSIS — Z9013 Acquired absence of bilateral breasts and nipples: Secondary | ICD-10-CM | POA: Diagnosis not present

## 2016-03-16 NOTE — Telephone Encounter (Signed)
Faxed pt medical records to Dr. Matilde Sprang

## 2016-04-22 ENCOUNTER — Other Ambulatory Visit: Payer: Self-pay | Admitting: Internal Medicine

## 2016-04-23 NOTE — Telephone Encounter (Signed)
Has an appt in July to establish.  Okay to fill until then?

## 2016-04-24 NOTE — Telephone Encounter (Signed)
Okay to refill? 

## 2016-04-26 NOTE — Telephone Encounter (Signed)
Sent to the pharmacy by e-scribe. 

## 2016-05-05 ENCOUNTER — Ambulatory Visit (INDEPENDENT_AMBULATORY_CARE_PROVIDER_SITE_OTHER): Payer: Medicare HMO | Admitting: Family Medicine

## 2016-05-05 ENCOUNTER — Encounter: Payer: Self-pay | Admitting: Family Medicine

## 2016-05-05 VITALS — BP 120/70 | HR 80 | Temp 98.1°F | Ht 60.0 in | Wt 198.0 lb

## 2016-05-05 DIAGNOSIS — N390 Urinary tract infection, site not specified: Secondary | ICD-10-CM

## 2016-05-05 LAB — POC URINALSYSI DIPSTICK (AUTOMATED)
BILIRUBIN UA: NEGATIVE
Glucose, UA: NEGATIVE
KETONES UA: NEGATIVE
Leukocytes, UA: NEGATIVE
NITRITE UA: NEGATIVE
PH UA: 6.5
Protein, UA: NEGATIVE
RBC UA: NEGATIVE
Spec Grav, UA: 1.01
Urobilinogen, UA: 0.2

## 2016-05-05 MED ORDER — CIPROFLOXACIN HCL 500 MG PO TABS: 500.0000 mg | ORAL_TABLET | Freq: Two times a day (BID) | ORAL | Status: DC

## 2016-05-05 NOTE — Progress Notes (Signed)
Pre visit review using our clinic review tool, if applicable. No additional management support is needed unless otherwise documented below in the visit note. 

## 2016-05-05 NOTE — Progress Notes (Signed)
   Subjective:    [name], female    DOB: 15-Jul-1941, 75 y.o.   MRN: [id number]  HPI Here for what she thinks is another UTI. For the past 2 days she has had increased urgency to urinate as well as a mild headache. No burning or fever or nausea. She says she feels exactly this way every time she has a UTI. She is drinking fluids.    Review of Systems  Constitutional: Negative.   Respiratory: Negative.   Cardiovascular: Negative.   Gastrointestinal: Negative.   Genitourinary: Positive for urgency and frequency. Negative for dysuria, hematuria and flank pain.       Objective:   Physical Exam  Constitutional: She appears well-developed and well-nourished. No distress.  Cardiovascular: Normal rate, regular rhythm, normal heart sounds and intact distal pulses.   Pulmonary/Chest: Effort normal and breath sounds normal.  Abdominal: Soft. Bowel sounds are normal. She exhibits no distension and no mass. There is no tenderness. There is no rebound and no guarding.          Assessment & Plan:  Probable UTI. Treat with Cipro. Culture the sample.  Laurey Morale, MD

## 2016-05-07 LAB — URINE CULTURE

## 2016-05-11 ENCOUNTER — Ambulatory Visit (INDEPENDENT_AMBULATORY_CARE_PROVIDER_SITE_OTHER): Payer: Medicare HMO | Admitting: Podiatry

## 2016-05-11 ENCOUNTER — Encounter: Payer: Self-pay | Admitting: Podiatry

## 2016-05-11 DIAGNOSIS — B351 Tinea unguium: Secondary | ICD-10-CM

## 2016-05-11 DIAGNOSIS — M79676 Pain in unspecified toe(s): Secondary | ICD-10-CM | POA: Diagnosis not present

## 2016-05-11 DIAGNOSIS — G4733 Obstructive sleep apnea (adult) (pediatric): Secondary | ICD-10-CM | POA: Diagnosis not present

## 2016-05-11 NOTE — Progress Notes (Signed)
She presents today with chief complaint of painful elongated toenails bilateral foot. She is also complaining of a painful right knee. She had a hemiarthroplasty performed to the right knee which is not well.  Objective: Vital signs are stable she is alert and oriented 3. Pulses are palpable. Toenails are thick yellow dystrophic and mycotic and painful.  Assessment: Pain elicited onychomycosis.  Plan: Debridement of toenails 1 through 5 bilateral covered service secondary to pain. Follow up with her in 3 months.

## 2016-05-13 DIAGNOSIS — D2371 Other benign neoplasm of skin of right lower limb, including hip: Secondary | ICD-10-CM | POA: Diagnosis not present

## 2016-05-13 DIAGNOSIS — D1800 Hemangioma unspecified site: Secondary | ICD-10-CM | POA: Diagnosis not present

## 2016-05-13 DIAGNOSIS — L680 Hirsutism: Secondary | ICD-10-CM | POA: Diagnosis not present

## 2016-05-13 DIAGNOSIS — L821 Other seborrheic keratosis: Secondary | ICD-10-CM | POA: Diagnosis not present

## 2016-05-13 DIAGNOSIS — L650 Telogen effluvium: Secondary | ICD-10-CM | POA: Diagnosis not present

## 2016-05-13 DIAGNOSIS — L814 Other melanin hyperpigmentation: Secondary | ICD-10-CM | POA: Diagnosis not present

## 2016-05-14 ENCOUNTER — Other Ambulatory Visit: Payer: Medicare HMO

## 2016-05-17 ENCOUNTER — Other Ambulatory Visit (INDEPENDENT_AMBULATORY_CARE_PROVIDER_SITE_OTHER): Payer: Medicare HMO

## 2016-05-17 DIAGNOSIS — Z0000 Encounter for general adult medical examination without abnormal findings: Secondary | ICD-10-CM | POA: Diagnosis not present

## 2016-05-17 DIAGNOSIS — R7989 Other specified abnormal findings of blood chemistry: Secondary | ICD-10-CM

## 2016-05-17 LAB — BASIC METABOLIC PANEL
BUN: 19 mg/dL (ref 6–23)
CHLORIDE: 104 meq/L (ref 96–112)
CO2: 27 meq/L (ref 19–32)
CREATININE: 0.64 mg/dL (ref 0.40–1.20)
Calcium: 8.8 mg/dL (ref 8.4–10.5)
GFR: 96.65 mL/min (ref >60.00)
Glucose, Bld: 134 mg/dL — ABNORMAL HIGH (ref 70–99)
Potassium: 4 mEq/L (ref 3.5–5.1)
Sodium: 139 mEq/L (ref 135–145)

## 2016-05-17 LAB — CBC WITH DIFFERENTIAL/PLATELET
BASOS PCT: 0.3 % (ref 0.0–3.0)
Basophils Absolute: 0 10*3/uL (ref 0.0–0.1)
EOS ABS: 0.3 10*3/uL (ref 0.0–0.7)
Eosinophils Relative: 3.8 % (ref 0.0–5.0)
HEMATOCRIT: 39.6 % (ref 36.0–46.0)
Hemoglobin: 13.3 g/dL (ref 12.0–15.0)
LYMPHS PCT: 25.8 % (ref 12.0–46.0)
Lymphs Abs: 1.9 10*3/uL (ref 0.7–4.0)
MCHC: 33.5 g/dL (ref 30.0–36.0)
MCV: 87.5 fl (ref 78.0–100.0)
Monocytes Absolute: 0.6 10*3/uL (ref 0.1–1.0)
Monocytes Relative: 7.6 % (ref 3.0–12.0)
NEUTROS ABS: 4.7 10*3/uL (ref 1.4–7.7)
Neutrophils Relative %: 62.5 % (ref 43.0–77.0)
PLATELETS: 179 10*3/uL (ref 150.0–400.0)
RBC: 4.52 Mil/uL (ref 3.87–5.11)
RDW: 14 % (ref 11.5–15.5)
WBC: 7.5 10*3/uL (ref 4.0–10.5)

## 2016-05-17 LAB — POC URINALSYSI DIPSTICK (AUTOMATED)
Bilirubin, UA: NEGATIVE
Blood, UA: NEGATIVE
GLUCOSE UA: NEGATIVE
KETONES UA: NEGATIVE
Leukocytes, UA: NEGATIVE
Nitrite, UA: NEGATIVE
Protein, UA: NEGATIVE
SPEC GRAV UA: 1.02
UROBILINOGEN UA: 0.2
pH, UA: 6

## 2016-05-17 LAB — HEPATIC FUNCTION PANEL
ALT: 22 U/L (ref 0–35)
AST: 15 U/L (ref 0–37)
Albumin: 3.7 g/dL (ref 3.5–5.2)
Alkaline Phosphatase: 61 U/L (ref 39–117)
BILIRUBIN TOTAL: 0.4 mg/dL (ref 0.2–1.2)
Bilirubin, Direct: 0.1 mg/dL (ref 0.0–0.3)
Total Protein: 5.8 g/dL — ABNORMAL LOW (ref 6.0–8.3)

## 2016-05-17 LAB — LIPID PANEL
CHOL/HDL RATIO: 5
CHOLESTEROL: 187 mg/dL (ref 0–200)
HDL: 36.2 mg/dL — ABNORMAL LOW (ref >39.00)
NonHDL: 150.85
TRIGLYCERIDES: 244 mg/dL — AB (ref 0.0–149.0)
VLDL: 48.8 mg/dL — AB (ref 0.0–40.0)

## 2016-05-17 LAB — MICROALBUMIN / CREATININE URINE RATIO
Creatinine,U: 128 mg/dL
MICROALB/CREAT RATIO: 0.5 mg/g (ref 0.0–30.0)

## 2016-05-17 LAB — LDL CHOLESTEROL, DIRECT: LDL DIRECT: 113 mg/dL

## 2016-05-17 LAB — HEMOGLOBIN A1C: HEMOGLOBIN A1C: 6.3 % (ref 4.6–6.5)

## 2016-05-17 LAB — TSH: TSH: 2.34 u[IU]/mL (ref 0.35–4.50)

## 2016-05-20 ENCOUNTER — Ambulatory Visit (INDEPENDENT_AMBULATORY_CARE_PROVIDER_SITE_OTHER): Payer: Medicare HMO | Admitting: Family Medicine

## 2016-05-20 ENCOUNTER — Encounter: Payer: Self-pay | Admitting: Family Medicine

## 2016-05-20 VITALS — BP 130/60 | HR 82 | Temp 98.3°F | Ht 60.0 in | Wt 201.0 lb

## 2016-05-20 DIAGNOSIS — E785 Hyperlipidemia, unspecified: Secondary | ICD-10-CM

## 2016-05-20 DIAGNOSIS — G4700 Insomnia, unspecified: Secondary | ICD-10-CM

## 2016-05-20 DIAGNOSIS — E039 Hypothyroidism, unspecified: Secondary | ICD-10-CM | POA: Diagnosis not present

## 2016-05-20 DIAGNOSIS — R7303 Prediabetes: Secondary | ICD-10-CM

## 2016-05-20 DIAGNOSIS — M25561 Pain in right knee: Secondary | ICD-10-CM

## 2016-05-20 DIAGNOSIS — Z0001 Encounter for general adult medical examination with abnormal findings: Secondary | ICD-10-CM

## 2016-05-20 DIAGNOSIS — K219 Gastro-esophageal reflux disease without esophagitis: Secondary | ICD-10-CM

## 2016-05-20 NOTE — Patient Instructions (Signed)
Please follow up in 3 months for prediabetes and medication management.  Prediabetes Eating Plan Prediabetes--also called impaired glucose tolerance or impaired fasting glucose--is a condition that causes blood sugar (blood glucose) levels to be higher than normal. Following a healthy diet can help to keep prediabetes under control. It can also help to lower the risk of type 2 diabetes and heart disease, which are increased in people who have prediabetes. Along with regular exercise, a healthy diet:  Promotes weight loss.  Helps to control blood sugar levels.  Helps to improve the way that the body uses insulin. WHAT DO I NEED TO KNOW ABOUT THIS EATING PLAN?  Use the glycemic index (GI) to plan your meals. The index tells you how quickly a food will raise your blood sugar. Choose low-GI foods. These foods take a longer time to raise blood sugar.  Pay close attention to the amount of carbohydrates in the food that you eat. Carbohydrates increase blood sugar levels.  Keep track of how many calories you take in. Eating the right amount of calories will help you to achieve a healthy weight. Losing about 7 percent of your starting weight can help to prevent type 2 diabetes.  You may want to follow a Mediterranean diet. This diet includes a lot of vegetables, lean meats or fish, whole grains, fruits, and healthy oils and fats. WHAT FOODS CAN I EAT? Grains Whole grains, such as whole-wheat or whole-grain breads, crackers, cereals, and pasta. Unsweetened oatmeal. Bulgur. Barley. Quinoa. Brown rice. Corn or whole-wheat flour tortillas or taco shells. Vegetables Lettuce. Spinach. Peas. Beets. Cauliflower. Cabbage. Broccoli. Carrots. Tomatoes. Squash. Eggplant. Herbs. Peppers. Onions. Cucumbers. Brussels sprouts. Fruits Berries. Bananas. Apples. Oranges. Grapes. Papaya. Mango. Pomegranate. Kiwi. Grapefruit. Cherries. Meats and Other Protein Sources Seafood. Lean meats, such as chicken and [place] or  lean cuts of pork and beef. Tofu. Eggs. Nuts. Beans. Dairy Low-fat or fat-free dairy products, such as yogurt, cottage cheese, and cheese. Beverages Water. Tea. Coffee. Sugar-free or diet soda. Seltzer water. Milk. Milk alternatives, such as soy or almond milk. Condiments Mustard. Relish. Low-fat, low-sugar ketchup. Low-fat, low-sugar barbecue sauce. Low-fat or fat-free mayonnaise. Sweets and Desserts Sugar-free or low-fat pudding. Sugar-free or low-fat ice cream and other frozen treats. Fats and Oils Avocado. Walnuts. Olive oil. The items listed above may not be a complete list of recommended foods or beverages. Contact your dietitian for more options.  WHAT FOODS ARE NOT RECOMMENDED? Grains Refined white flour and flour products, such as bread, pasta, snack foods, and cereals. Beverages Sweetened drinks, such as sweet iced tea and soda. Sweets and Desserts Baked goods, such as cake, cupcakes, pastries, cookies, and cheesecake. The items listed above may not be a complete list of foods and beverages to avoid. Contact your dietitian for more information.   This information is not intended to replace advice given to you by your health care provider. Make sure you discuss any questions you have with your health care provider.   Document Released: 03/18/2015 Document Reviewed: 03/18/2015 Elsevier Interactive Patient Education [employer].

## 2016-05-20 NOTE — Progress Notes (Signed)
Pre visit review using our clinic review tool, if applicable. No additional management support is needed unless otherwise documented below in the visit note. 

## 2016-05-20 NOTE — Progress Notes (Signed)
Subjective:    [name], female    DOB: 09-30-1941, 75 y.o.   MRN: [id number]  HPI  [name] is a 75 year old female who is here today for routine care.   Hypothyroidism:  Patient is maintained on daily levothyroxine 100 mcg daily. She denies changes in skin, or nails with the exception of pruritis that occurs occasionally. She reports thinning of her hair and hirsutism that is present. She reports recently seeing a dermatologist for this issue and declined treatment that was suggested due to expense. She reports swimming daily in a salt water pool and does not use soap or lotions on her skin.   GERD:  Well controlled with diet and nexium as needed. She does not report any recent episodes of reflux. Denies dysphagia, melena, or hematochezia.   Irritable Bowel Syndrome: Well controlled. Denies N/V/D, or constipation.  Last colonoscopy in 2013. Next due in 2018 and she is followed by GI.  Hyperlipidemia:  Total cholesterol has decreased since last year, however triglyceride level is increasing and HDL is decreasing. Patient reports simvastatin daily and lovasa for cholesterol. She and her husband report poor dietary habits that can be improved. Muscle cramping is reported only with swimming on an "occasional" basis and these cramps are relived with gentle stretching. Lab Results  Component Value Date   CHOL 187 05/17/2016   CHOL 200 04/30/2015   CHOL 205* 10/09/2014   Lab Results  Component Value Date   HDL 36.20* 05/17/2016   HDL 42.90 04/30/2015   HDL 38.60* 10/09/2014   Lab Results  Component Value Date   LDLCALC 131* 04/30/2015   LDLCALC 84 06/22/2013   LDLCALC 109* 12/14/2011   Lab Results  Component Value Date   TRIG 244.0* 05/17/2016   TRIG 129.0 04/30/2015   TRIG 217.0* 10/09/2014   Lab Results  Component Value Date   CHOLHDL 5 05/17/2016   CHOLHDL 5 04/30/2015   CHOLHDL 5 10/09/2014   Lab Results  Component Value Date   LDLDIRECT 113.0  05/17/2016   LDLDIRECT 119.1 10/09/2014   LDLDIRECT 176.1 03/18/2008   Insomnia:  Sleeps 4 to 6 hours a day.  Uses a fitbit and this monitors her sleep which noted that she wakes up "several" times/night. Wears CPAP and is followed by pulmonology. No caffeine prior to bed and she notes good sleep hygiene practices other than eating chocolate in the evening. She has a follow up appointment in 8 days for this issue with her pulmonologist.  Right Knee Pain:  She had a partial knee replacement in 2016 and reports healing has taken many months.  She notes pain with this knee when walking and has recently noted pain when swimming. She is scheduled for follow up with her orthopedist for this issue.  Prediabetes: Lab results today indicate that patient is in the prediabetes range. She reports that she does not follow a particular diet and has noticed weight gain. She denies polyphagia, polydipsia, and polyuria. She does not monitor her blood sugar at home. Lab Results  Component Value Date   HGBA1C 6.3 05/17/2016   Wt Readings from Last 3 Encounters:  05/20/16 201 lb (91.173 kg)  05/05/16 198 lb (89.812 kg)  03/10/16 199 lb 12.8 oz (90.629 kg)   Diet:  Eats two meals a day and reports a protein bar in between if needed.  Breakfast includes egg sandwiches and canadian bacon in the morning.  She reports eating "quite a bit of eggs". Dinner  includes two beef patties with vegetables for dinner. She reports eating desserts and discusses that she does not follow a heart healthy diet. She does not monitor her salt intake. Denies drinking water and only drinks sugar free lemonade daily.  Exercise:  Swim one hour per day without cardiopulmonary symptoms.   Past Medical History  Diagnosis Date  . Other and unspecified hyperlipidemia   . Pain in joint, shoulder region   . Irritable bowel syndrome   . Chronic insomnia   . GERD (gastroesophageal reflux disease)   . History of colonic polyps   . Fibromyalgia    . Hypothyroidism   . Other abnormal glucose   . Stress incontinence, female   . Macular degeneration   . Arthritis   . Kidney stone   . Cataract   . Depression   . Pneumonia [id number]  . H/O hiatal hernia   . Breast cancer ([place]) 08/08/13    right LOQ  . Hx of radiation therapy 10/29/13- 12/14/13    right chest wall 5040 cGy 28 sessions, right supraclavicular/axillary region 5040 cGy 28 sessions, right chest wall boost 1000 cGy 5 sessions  . Asthma     hx of -no inhalers, no problems  . Cluster headaches     history of migraines / NONE FOR SEVERAL YRS  . Osteopenia   . History of transfusion   . Sleep apnea     USES C-PAP  . Anxiety     PHOBIAS  . Lymphedema     RT ARM - WEARS SLEEVE     Social History   Social History  . Marital Status: Married    Spouse Name: N/A  . Number of Children: 3  . Years of Education: N/A   Occupational History  . retired Radiation protection practitioner    Social History Main Topics  . Smoking status: Former Smoker -- 0.10 packs/day for 2 years    Types: Cigarettes    Start date: 11/16/1959    Quit date: 11/15/1960  . Smokeless tobacco: Never Used  . Alcohol Use: No  . Drug Use: No  . Sexual Activity: Not on file     Comment: menarche age 46, fist live birth 66, P 3, hysterectomy age 10, no HRT, BCP 2 yrs   Other Topics Concern  . 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 Appendectomy    . Cholecystectomy    . Abdominal hysterectomy    . Tonsillectomy    . Eye surgery      to repair macular hole  . Cataract extraction, bilateral    . Knee arthroscopy      > 10 years ago  . Joint replacement  03/15/11    left knee replacement  . Colonoscopy    . Ganglion cyst excision      rt foot  . Foot arthroplasty      lt   . Mass excision  11/04/2011    Procedure: EXCISION MASS;   Surgeon: Cammie Sickle., MD;  Location: [place] [facility];  Service: Orthopedics;  Laterality: Right;  excisional biopsy right ulna mass  . Hemorrhoid surgery      03/1993  . Breast cyst aspiration      9 cysts  .  Toe surgery      preventative crossover toe surg/right foot  . Toe surgery      left foot/screw  in 2nd toe  . Upper gastrointestinal endoscopy    . Skin tags removed      breast, panty line, neckline  . Bilateral total mastectomy with axillary lymph node dissection  08/30/2013    Dr Barry Dienes  . Simple mastectomy with axillary sentinel node biopsy Left 08/30/2013    Procedure: Bilateral Breast Mastectomy ;  Surgeon: Stark Klein, MD;  Location: [facility];  Service: General;  Laterality: Left;  Marland Kitchen Mastectomy w/ sentinel node biopsy Right 08/30/2013    Procedure: RIGHT  AXILLARY SENTINEL LYMPH NODE BIOPSY; Right Axillary Node Disection;  Surgeon: Stark Klein, MD;  Location: [facility];  Service: General;  Laterality: Right;  Right side nuc med 7:00   . Evacuation breast hematoma Left 08/31/2013    Procedure: EVACUATION HEMATOMA BREAST;  Surgeon: Stark Klein, MD;  Location: [facility];  Service: General;  Laterality: Left;  . Partial knee arthroplasty Right 11/03/2015    Procedure: RIGHT KNEE MEDIAL UNICOMPARTMENTAL ARTHROPLASTY ;  Surgeon: Gaynelle Arabian, MD;  Location: [facility];  Service: Orthopedics;  Laterality: Right;    Family History  Problem Relation Age of Onset  . Stroke Mother     died age 6  . Diabetes Mother   . Breast cancer Mother 25  . Breast cancer Sister 47  . Breast cancer Paternal Aunt 37  . Diabetes Maternal Grandfather   . Breast cancer Paternal Grandmother 49  . Breast cancer Paternal Aunt     dx in her 53s  . Cancer Maternal Grandmother     intra-abdominal cancer  . Brain cancer Maternal Uncle 8  . Brain cancer Cousin 16    maternal cousin  . Brain cancer Cousin 20    paternal cousin    No Known Allergies  Current Outpatient Prescriptions on  File Prior to Visit  Medication Sig Dispense Refill  . Calcium Carb-Cholecalciferol (CALCIUM-VITAMIN D) 500-400 MG-UNIT TABS Take by mouth.    . citalopram (CELEXA) 20 MG tablet Take 1 tablet by mouth  daily 90 tablet 1  . docusate sodium (COLACE) 100 MG capsule Take 100 mg by mouth 3 (three) times daily.     Marland Kitchen esomeprazole (NEXIUM) 40 MG capsule Take 1 capsule by mouth  daily 90 capsule 1  . levothyroxine (SYNTHROID, LEVOTHROID) 100 MCG tablet Take 1 tablet (100 mcg total) by mouth daily before breakfast. 90 tablet 1  . LUTEIN PO Take by mouth.    . meloxicam (MOBIC) 7.5 MG tablet Take 1 tablet (7.5 mg total) by mouth daily. 90 tablet 1  . methocarbamol (ROBAXIN) 500 MG tablet Take 500 mg by mouth every 6 (six) hours as needed for muscle spasms.    Marland Kitchen omega-3 acid ethyl esters (LOVAZA) 1 g capsule TAKE 1 CAPSULE TWICE DAILY 180 capsule 0  . pregabalin (LYRICA) 75 MG capsule Take 1 capsule (75 mg total) by mouth 2 (two) times daily. 180 capsule 1  . simvastatin (ZOCOR) 10 MG tablet Take 1 tablet by mouth at  bedtime 90 tablet 1  . tamoxifen (NOLVADEX) 20 MG tablet Take 1 tablet (20 mg total) by mouth daily. 30 tablet 1  . temazepam (RESTORIL) 15 MG capsule Take 1 capsule (15 mg total) by mouth at bedtime as needed for sleep. 90 capsule 1  . traMADol (ULTRAM) 50 MG tablet Take 1-2 tablets (50-100 mg total) by mouth every 6 (six)  hours as needed (mild pain). 80 tablet 1  . Wheat Dextrin (BENEFIBER PO) Take by mouth.     No current facility-administered medications on file prior to visit.    BP 130/60 mmHg  Pulse 82  Temp(Src) 98.3 F (36.8 C) (Oral)  Ht 5' (1.524 m)  Wt 201 lb (91.173 kg)  BMI 39.26 kg/m2     Review of Systems  Constitutional: Negative for fever, chills and fatigue.  HENT: Negative for postnasal drip and rhinorrhea.   Eyes: Negative for visual disturbance.  Respiratory: Negative for cough and shortness of breath.   Cardiovascular: Negative for chest pain,  palpitations and leg swelling.  Gastrointestinal: Negative for nausea, vomiting, abdominal pain, diarrhea and constipation.  Endocrine: Negative for polydipsia, polyphagia and polyuria.       Reports thinning hair and hirsutism. She is followed by dermatology for this issue.  Genitourinary: Negative for dysuria, urgency and hematuria.  Musculoskeletal:       Right knee pain and occasional muscle cramps when swimming  Skin: Negative for rash.  Neurological: Negative for dizziness, light-headedness, numbness and headaches.  Psychiatric/Behavioral:       Denies depressed or anxious mood today       Objective:   Physical Exam  Constitutional: She is oriented to person, place, and time. She appears well-developed and well-nourished.  Eyes: Pupils are equal, round, and reactive to light. No scleral icterus.  Neck: Neck supple.  Cardiovascular: Normal rate, regular rhythm and intact distal pulses.   Pulmonary/Chest: Effort normal and breath sounds normal. She has no wheezes. She has no rales.  Followed by oncology for history of breast cancer and treatment. Deferred exam  Abdominal: Soft. Bowel sounds are normal. There is no tenderness.  Musculoskeletal: She exhibits no edema.  Lymphadenopathy:    She has no cervical adenopathy.  Neurological: She is alert and oriented to person, place, and time. She has normal strength. No sensory deficit. Coordination and gait normal.  Reflex Scores:      Brachioradialis reflexes are 2+ on the right side and 2+ on the left side.      Patellar reflexes are 2+ on the right side and 2+ on the left side. Skin: Skin is warm and dry. No rash noted.  Psychiatric: She has a normal mood and affect. Her behavior is normal. Judgment and thought content normal.       Assessment & Plan:  1. Encounter for general adult medical examination with abnormal findings 75 y.o. female presenting for annual physical.  Health Maintenance counseling: 1. Anticipatory  guidance: Patient counseled regarding regular dental exams, eye exams, wearing seatbelts.  2. Risk factor reduction:  Advised patient of need for regular exercise and diet rich and fruits and vegetables to reduce risk of heart attack and stroke.  3. Immunizations/screenings/ancillary studies Immunization History  Administered Date(s) Administered  . Influenza Split 07/27/2012  . Influenza Whole 09/13/2007, 08/08/2008, 07/30/2009, 08/25/2010  . Influenza, High Dose Seasonal PF 08/03/2014, 08/08/2015  . Influenza,inj,Quad PF,36+ Mos 07/25/2013  . Pneumococcal Conjugate-13 08/16/2013  . Pneumococcal Polysaccharide-23 06/18/2008  . Tdap 11/29/2012  . Zoster 08/05/2011   There are no preventive care reminders to display for this patient.  4. Cervical cancer screening- No needed 5. Breast cancer screening- History of breast cancer of lower-outer quadrant of right female breast. Followed by oncology 6. Colon cancer screening - Due in 2018   2. Prediabetes Discussed with patient dietary and lifestyle modifications to decrease weight and improve glucose levels. Also discussed the  importance of starting an agent to decrease blood sugar if dietary and lifestyle changes do not significantly influence glucose in 3 months. Advised nutrition referral and patient and husband declined at this time. Patient and husband wanted a trial of dietary and lifestyle change before medication initiation due to patient taking multiple medications at this time.  - Hemoglobin A1c; Future - Basic metabolic panel; Future  3. Hyperlipidemia Reviewed risks associated with elevated cholesterol level with patient. Advised weight loss, exercise, and dietary changes with current statin and omega 3 therapy. Patient will initiate dietary changes and we will review this again in 3 months. Advised patient that medication changes will need to occur if numbers have not significantly declined with lifestyle changes. Recommended  increasing simvastatin from 10 mg to 20 mg with lifestyle changes after 3 month trial per agreement with patient, however increase will be monitored with caution due to concurrent tamoxifen use.  4. Hypothyroidism, unspecified hypothyroidism type Continue levothyroxine as directed.   5. Gastroesophageal reflux disease without esophagitis Nexium as needed. Avoidance of dietary triggers recommended.  6. Insomnia Continue CPAP use and follow up with pulmonologist as directed  7. Right knee pain Follow up as scheduled with orthopedic speciality for knee pain following knee replacement surgery last year.  Delano Metz, FNP-C

## 2016-05-21 ENCOUNTER — Encounter: Payer: Medicare HMO | Admitting: Internal Medicine

## 2016-05-23 ENCOUNTER — Encounter: Payer: Self-pay | Admitting: Family Medicine

## 2016-05-25 ENCOUNTER — Other Ambulatory Visit: Payer: Self-pay | Admitting: Family Medicine

## 2016-05-25 DIAGNOSIS — R7303 Prediabetes: Secondary | ICD-10-CM

## 2016-05-27 ENCOUNTER — Other Ambulatory Visit (INDEPENDENT_AMBULATORY_CARE_PROVIDER_SITE_OTHER): Payer: Medicare HMO

## 2016-05-27 DIAGNOSIS — R7303 Prediabetes: Secondary | ICD-10-CM | POA: Diagnosis not present

## 2016-05-27 LAB — GLUCOSE, RANDOM: Glucose, Bld: 129 mg/dL — ABNORMAL HIGH (ref 70–99)

## 2016-05-28 ENCOUNTER — Ambulatory Visit (INDEPENDENT_AMBULATORY_CARE_PROVIDER_SITE_OTHER): Payer: Medicare HMO | Admitting: Internal Medicine

## 2016-05-28 ENCOUNTER — Encounter: Payer: Self-pay | Admitting: Internal Medicine

## 2016-05-28 VITALS — BP 140/84 | HR 66 | Ht 60.0 in | Wt 199.2 lb

## 2016-05-28 DIAGNOSIS — G4733 Obstructive sleep apnea (adult) (pediatric): Secondary | ICD-10-CM | POA: Diagnosis not present

## 2016-05-28 DIAGNOSIS — G4700 Insomnia, unspecified: Secondary | ICD-10-CM

## 2016-05-28 DIAGNOSIS — F5104 Psychophysiologic insomnia: Secondary | ICD-10-CM

## 2016-05-28 NOTE — Progress Notes (Signed)
06/25/15- 32 yoF negligible smoker referred courtesy of Dr Chaney Born.Had home sleep study-printed and attached to consult sheet. Unattended Home Sleep Test 03/06/2015- severe obstructive sleep apnea/hypopnea syndrome, AHI 44.9 per hour with desaturation to 74% and mean saturation only 89% on room air. Body weight 190 pounds   [facility] 4/24 Medical problems including hypothyroidism, chronic insomnia, asthma, GERD, history breast cancer R, Husband here   Daughter is pediatrician in [place] She gives long history as a short sleeper averaging 3-5 hours per night with difficulty initiating and maintaining sleep on a chronic basis. Currently using Restoril 15 mg with a little benefit. Bedtime between 11 and midnight estimating 15 minutes sleep latency during which time she counts downward from 102 or 3 times. Waking 3 or 4 times before and getting up between 6 and 8 AM. ENT surgery tonsils. Remote history of asthma she says has resolved. Bilateral mastectomy after breast cancer. Fibromyalgia.  09/26/15- 75 year old female nonsmoker followed for obstructive sleep apnea CPAP auto started 06/25/15/              husband here FOLLOWS FOR: DME: [facility]; set up with CPAP machine since last OV-DL from [facility] printed.  Download confirms good compliance with CPAP 5-20 auto/[facility]. She complains of restlessness at night, waking frequently. Her Fit bit tells her she gets 3 or 4 hours of sleep Tries Restoril but it doesn't sustain sleep  01/26/2016-75 year old female nonsmoker followed for obstructive sleep apnea, insomnia, complicated by hypothyroid, asthma, GERD, psoriasis, CA L breast CPAP auto 8-15/[facility]  05/28/2016-75 year old female nonsmoker followed for OSA, insomnia, complicated by hypothyroid, asthma, GERD, psoriasis, CA L breast CPAP auto 8-15/[facility]            Husband here Tried Silenor for sleep instead of temazepam FOLLOW FOR: uses CPAP machine every night,  no concerns. Weight at time of  sleep study 190 pounds. Weight now 199 We discussed function of CPAP humidifier. Download looks great with pressure control 8-15, good compliance. Husband says CPAP stops her snoring so he no longer needs earplugs. She feels she is sleeping well and not needing medication.  ROS-see HPI   Negative unless "+" Constitutional:    weight loss, night sweats, fevers, chills, fatigue, lassitude. HEENT:    headaches, difficulty swallowing, +tooth/dental problems, sore throat,       sneezing, itching, ear ache, nasal congestion, post nasal drip, snoring CV:    chest pain, orthopnea, PND, swelling in lower extremities, anasarca,                                                  dizziness, palpitations Resp:   +shortness of breath with exertion or at rest.                productive cough,   non-productive cough, coughing up of blood.              change in color of mucus.  wheezing.   Skin:    rash or lesions. GI:     +heartburn, indigestion, abdominal pain, nausea, vomiting, diarrhea,                 change in bowel habits, loss of appetite GU: dysuria, change in color of urine, no urgency or frequency.   flank pain. MS:   +joint pain, stiffness, decreased range of motion, back pain. Neuro-  nothing unusual Psych:  change in mood or affect.  depression or anxiety.   memory loss.  OBJ- Physical Exam General- Alert, Oriented, Affect-appropriate, Distress- none acute, + obese Skin- rash-none, lesions- none, excoriation- none Lymphadenopathy- none Head- atraumatic            Eyes- Gross vision intact, PERRLA, conjunctivae and secretions clear            Ears- Hearing, canals-normal            Nose- Clear, no-Septal dev, mucus, polyps, erosion, perforation             Throat- Mallampati III-IV , mucosa clear , drainage- none, tonsils- atrophic Neck- flexible , trachea midline, no stridor , thyroid nl, carotid no bruit Chest - symmetrical excursion , unlabored           Heart/CV- RRR , no murmur ,  no gallop  , no rub, nl s1 s2                           - JVD- none , edema- none, stasis changes- none, varices- none           Lung- clear to P&A, wheeze- none, cough- none , dullness-none, rub- none           Chest wall- + bilateral mastectomy Abd-  Br/ Gen/ Rectal- Not done, not indicated Extrem- cyanosis- none, clubbing, none, atrophy- none, strength- nl Neuro- grossly intact to observation

## 2016-05-28 NOTE — Assessment & Plan Note (Signed)
She indicates less stress in her life now and sleeping much better without needing medication.

## 2016-05-28 NOTE — Patient Instructions (Signed)
We can continue CPAP auto 8-15/ [facility]   Suggest you try otc melatonin and diphenhydramine/ Benadryl as sleep aids if needed  Please call as needed

## 2016-05-28 NOTE — Assessment & Plan Note (Signed)
Excellent compliance and control verified by download. We can continue CPAP auto 8-15

## 2016-05-30 ENCOUNTER — Encounter: Payer: Self-pay | Admitting: Family Medicine

## 2016-05-31 DIAGNOSIS — Z471 Aftercare following joint replacement surgery: Secondary | ICD-10-CM | POA: Diagnosis not present

## 2016-05-31 DIAGNOSIS — Z96651 Presence of right artificial knee joint: Secondary | ICD-10-CM | POA: Diagnosis not present

## 2016-06-22 ENCOUNTER — Other Ambulatory Visit: Payer: Self-pay | Admitting: Emergency Medicine

## 2016-06-22 ENCOUNTER — Telehealth: Payer: Self-pay | Admitting: Family Medicine

## 2016-06-22 MED ORDER — CITALOPRAM HYDROBROMIDE 20 MG PO TABS: ORAL_TABLET | ORAL | 1 refills | Status: DC

## 2016-06-22 NOTE — Telephone Encounter (Signed)
° °  Pt req refill on the following med   citalopram (CELEXA) 20 MG tablet   Husband would like to pick up the rx

## 2016-06-22 NOTE — Telephone Encounter (Signed)
Okay to refill. JK

## 2016-06-22 NOTE — Telephone Encounter (Signed)
Pt requesting citalopram (CELEXA) 20 mg  Lat refilled 01/30/16 #90 Refill:1  Last ov 05/20/16   Okay to refill?

## 2016-07-02 DIAGNOSIS — Z471 Aftercare following joint replacement surgery: Secondary | ICD-10-CM | POA: Diagnosis not present

## 2016-07-02 DIAGNOSIS — M7051 Other bursitis of knee, right knee: Secondary | ICD-10-CM | POA: Diagnosis not present

## 2016-07-02 DIAGNOSIS — Z96651 Presence of right artificial knee joint: Secondary | ICD-10-CM | POA: Diagnosis not present

## 2016-07-03 ENCOUNTER — Other Ambulatory Visit: Payer: Self-pay | Admitting: Family Medicine

## 2016-07-03 ENCOUNTER — Other Ambulatory Visit: Payer: Self-pay | Admitting: Internal Medicine

## 2016-07-06 ENCOUNTER — Encounter: Payer: Self-pay | Admitting: Family Medicine

## 2016-07-08 ENCOUNTER — Other Ambulatory Visit: Payer: Self-pay | Admitting: Emergency Medicine

## 2016-07-08 MED ORDER — SIMVASTATIN 10 MG PO TABS: 10.0000 mg | ORAL_TABLET | Freq: Every day | ORAL | 1 refills | Status: DC

## 2016-07-08 MED ORDER — LEVOTHYROXINE SODIUM 100 MCG PO TABS: 100.0000 ug | ORAL_TABLET | Freq: Every day | ORAL | 1 refills | Status: DC

## 2016-07-29 DIAGNOSIS — M7051 Other bursitis of knee, right knee: Secondary | ICD-10-CM | POA: Diagnosis not present

## 2016-07-29 DIAGNOSIS — Z96651 Presence of right artificial knee joint: Secondary | ICD-10-CM | POA: Diagnosis not present

## 2016-07-29 DIAGNOSIS — Z471 Aftercare following joint replacement surgery: Secondary | ICD-10-CM | POA: Diagnosis not present

## 2016-07-30 DIAGNOSIS — H353131 Nonexudative age-related macular degeneration, bilateral, early dry stage: Secondary | ICD-10-CM | POA: Diagnosis not present

## 2016-08-01 ENCOUNTER — Encounter: Payer: Self-pay | Admitting: Family Medicine

## 2016-08-03 ENCOUNTER — Ambulatory Visit (INDEPENDENT_AMBULATORY_CARE_PROVIDER_SITE_OTHER): Payer: Medicare HMO | Admitting: Podiatry

## 2016-08-03 ENCOUNTER — Encounter: Payer: Self-pay | Admitting: Podiatry

## 2016-08-03 DIAGNOSIS — M79676 Pain in unspecified toe(s): Secondary | ICD-10-CM | POA: Diagnosis not present

## 2016-08-03 DIAGNOSIS — B351 Tinea unguium: Secondary | ICD-10-CM

## 2016-08-03 NOTE — Progress Notes (Signed)
She presents today for chief complaint of painful elongated toenails corns and calluses bilaterally.  Objective: Vital signs are stable she is alert and oriented 3 pulses are palpable. Toenails are thick L dystrophic with mycotic sharply incurvated.  Assessment: Pain limited secondary to onychomycosis.  Plan: Debridement of toenails 1 through 5 bilateral.

## 2016-08-06 NOTE — Telephone Encounter (Signed)
Pt called because had not heard anything all week. Addressed pt's concerns, advised on tsh lab had been done, flu shot scheduled and husband is getting flu shot same day as pt's appointment. No need to call back.

## 2016-08-13 DIAGNOSIS — G4733 Obstructive sleep apnea (adult) (pediatric): Secondary | ICD-10-CM | POA: Diagnosis not present

## 2016-08-16 ENCOUNTER — Encounter: Payer: Self-pay | Admitting: Family Medicine

## 2016-08-16 ENCOUNTER — Other Ambulatory Visit (INDEPENDENT_AMBULATORY_CARE_PROVIDER_SITE_OTHER): Payer: Medicare HMO

## 2016-08-16 DIAGNOSIS — E038 Other specified hypothyroidism: Secondary | ICD-10-CM | POA: Diagnosis not present

## 2016-08-16 DIAGNOSIS — R7303 Prediabetes: Secondary | ICD-10-CM

## 2016-08-16 LAB — BASIC METABOLIC PANEL
BUN: 20 mg/dL (ref 6–23)
CO2: 26 mEq/L (ref 19–32)
Calcium: 8.4 mg/dL (ref 8.4–10.5)
Chloride: 106 mEq/L (ref 96–112)
Creatinine, Ser: 0.72 mg/dL (ref 0.40–1.20)
GFR: 84.31 mL/min (ref >60.00)
Glucose, Bld: 107 mg/dL — ABNORMAL HIGH (ref 70–99)
POTASSIUM: 4.2 meq/L (ref 3.5–5.1)
SODIUM: 138 meq/L (ref 135–145)

## 2016-08-16 LAB — HEMOGLOBIN A1C: HEMOGLOBIN A1C: 6.3 % (ref 4.6–6.5)

## 2016-08-16 LAB — TSH: TSH: 3.29 u[IU]/mL (ref 0.35–4.50)

## 2016-08-23 ENCOUNTER — Encounter: Payer: Self-pay | Admitting: Family Medicine

## 2016-08-23 ENCOUNTER — Ambulatory Visit (INDEPENDENT_AMBULATORY_CARE_PROVIDER_SITE_OTHER): Payer: Medicare HMO | Admitting: Family Medicine

## 2016-08-23 VITALS — BP 118/62 | HR 86 | Temp 98.8°F | Ht 60.0 in | Wt 197.2 lb

## 2016-08-23 DIAGNOSIS — R7303 Prediabetes: Secondary | ICD-10-CM

## 2016-08-23 DIAGNOSIS — E785 Hyperlipidemia, unspecified: Secondary | ICD-10-CM | POA: Diagnosis not present

## 2016-08-23 DIAGNOSIS — E039 Hypothyroidism, unspecified: Secondary | ICD-10-CM

## 2016-08-23 DIAGNOSIS — Z23 Encounter for immunization: Secondary | ICD-10-CM | POA: Diagnosis not present

## 2016-08-23 DIAGNOSIS — Z87898 Personal history of other specified conditions: Secondary | ICD-10-CM

## 2016-08-23 NOTE — Progress Notes (Signed)
Subjective:    [name], female    DOB: 11/07/1941, 75 y.o.   MRN: [id number]  HPI  Jean Nash is a 75 year old female who is here for follow up of multiple medical problems.  Prediabetes   Patient is currently using exercise and dietary changes which has improved glucose levels and A1C has not increased. She is motivated to continue this pattern with close follow up.  Lab Results  Component Value Date   HGBA1C 6.3 08/16/2016   HGBA1C 6.3 05/17/2016   HGBA1C 6.2 11/25/2015    Lab Results  Component Value Date   MICROALBUR <0.7 05/17/2016   LDLCALC 131 (H) 04/30/2015   CREATININE 0.72 08/16/2016    Last diabetic eye exam was 07/2016 Denies polyuria/polydipsia. Denies hypoglycemia Home glucose readings range at home 106 to 120 and is monitored "sporadically" once or twice/week at most.   Hyperlipidemia  Patient is currently maintained on the following medication for hyperlipidemia:  Simvastatin 10 mg daily. Will continue dietary changes and follow up with lipid level at next visit.  Last lipid panel as follows:  Lab Results  Component Value Date   CHOL 187 05/17/2016   HDL 36.20 (L) 05/17/2016   LDLCALC 131 (H) 04/30/2015   LDLDIRECT 113.0 05/17/2016   TRIG 244.0 (H) 05/17/2016   CHOLHDL 5 05/17/2016   Patient denies myalgia. Patient reports good compliance with low fat/low cholesterol diet.    Hypothyroidism  Patient is here to monitor thyroid status There has been no change in the dose, brand, or mode of administration of thyroid supplement Last TSH   Lab Results  Component Value Date   TSH 3.29 08/16/2016    Pertinent negative or absent signs and symptoms are as follows: Constitutional: No significant change in weight; significant fatigue; sleep disorder; change in appetite. Eye: no blurred, double ,loss of vision Cardiovascular: no palpitations; racing; irregularity ENT/GI: no constipation; diarrhea;hoarseness;dysphagia Derm: no  change in nails,hair,skin Neuro: no numbness or tingling; tremor Psych:no anxiety; depression; panic attacks Endo: no temperature intolerance to heat ,cold   Review of Systems  Constitutional: Negative for chills, diaphoresis and fever.  Respiratory: Negative for cough, shortness of breath and wheezing.   Cardiovascular: Negative for chest pain and palpitations.  Gastrointestinal: Negative for abdominal pain, constipation, diarrhea, nausea and vomiting.  Genitourinary: Negative for dysuria, flank pain, frequency, hematuria and urgency.       History of bladder leakage  Musculoskeletal: Negative for myalgias.  Skin: Negative for rash.  Neurological: Negative for dizziness, syncope, weakness, light-headedness and headaches.  Psychiatric/Behavioral:       Denies depressed or anxious mood   Past Medical History:  Diagnosis Date  . Anxiety    PHOBIAS  . Arthritis   . Asthma    hx of -no inhalers, no problems  . Breast cancer ([place]) 08/08/13   right LOQ  . Cataract   . Chronic insomnia   . Cluster headaches    history of migraines / NONE FOR SEVERAL YRS  . Depression   . Fibromyalgia   . GERD (gastroesophageal reflux disease)   . H/O hiatal hernia   . History of colonic polyps   . History of transfusion   . Hx of radiation therapy 10/29/13- 12/14/13   right chest wall 5040 cGy 28 sessions, right supraclavicular/axillary region 5040 cGy 28 sessions, right chest wall boost 1000 cGy 5 sessions  . Hypothyroidism   . Irritable bowel syndrome   . Kidney stone   . Lymphedema  RT ARM - WEARS SLEEVE  . Macular degeneration   . Osteopenia   . Other abnormal glucose   . Other and unspecified hyperlipidemia   . Pain in joint, shoulder region   . Pneumonia [id number]  . Sleep apnea    USES C-PAP  . Stress incontinence, female      Social History   Social History  . Marital status: Married    Spouse name: N/A  . Number of children: 3  . Years of education: N/A   Occupational  History  . retired Radiation protection practitioner    Social History Main Topics  . Smoking status: Former Smoker    Packs/day: 0.10    Years: 2.00    Types: Cigarettes    Start date: 11/16/1959    Quit date: 11/15/1960  . Smokeless tobacco: Never Used  . Alcohol use No  . Drug use: No  . Sexual activity: Not on file     Comment: menarche age 72, fist live birth 88, P 3, hysterectomy age 64, no HRT, BCP 2 yrs   Other Topics Concern  . Not on file   Social History Narrative   Occupation:  Retired Radiation protection practitioner    Married with 3 grown children      Never Smoked     Alcohol use-no         Past Surgical History:  Procedure Laterality Date  . ABDOMINAL HYSTERECTOMY    . APPENDECTOMY    . BILATERAL TOTAL MASTECTOMY WITH AXILLARY LYMPH NODE DISSECTION  08/30/2013   Dr Barry Dienes  . BREAST CYST ASPIRATION     9 cysts  . CATARACT EXTRACTION, BILATERAL    . CHOLECYSTECTOMY    . COLONOSCOPY    . EVACUATION BREAST HEMATOMA Left 08/31/2013   Procedure: EVACUATION HEMATOMA BREAST;  Surgeon: Stark Klein, MD;  Location: [place];  Service: General;  Laterality: Left;  . EYE SURGERY     to repair macular hole  . FOOT ARTHROPLASTY     lt   . GANGLION CYST EXCISION     rt foot  . HEMORRHOID SURGERY     03/1993  . JOINT REPLACEMENT  03/15/11   left knee replacement  . KNEE ARTHROSCOPY     > 10 years ago  . MASS EXCISION  11/04/2011   Procedure: EXCISION MASS;  Surgeon: Cammie Sickle., MD;  Location: [place];  Service: Orthopedics;  Laterality: Right;  excisional biopsy right ulna mass  . MASTECTOMY W/ SENTINEL NODE BIOPSY Right 08/30/2013   Procedure: RIGHT  AXILLARY SENTINEL LYMPH NODE BIOPSY; Right Axillary Node Disection;  Surgeon: Stark Klein, MD;  Location: [place];  Service: General;  Laterality: Right;  Right side nuc med 7:00   . PARTIAL KNEE ARTHROPLASTY Right 11/03/2015   Procedure: RIGHT KNEE MEDIAL UNICOMPARTMENTAL ARTHROPLASTY ;  Surgeon: Gaynelle Arabian, MD;  Location: [facility];   Service: Orthopedics;  Laterality: Right;  . SIMPLE MASTECTOMY WITH AXILLARY SENTINEL NODE BIOPSY Left 08/30/2013   Procedure: Bilateral Breast Mastectomy ;  Surgeon: Stark Klein, MD;  Location: [place];  Service: General;  Laterality: Left;  . skin tags removed     breast, panty line, neckline  . TOE SURGERY     preventative crossover toe surg/right foot  . TOE SURGERY     left foot/screw  in 2nd toe  . TONSILLECTOMY    . UPPER GASTROINTESTINAL ENDOSCOPY      Family History  Problem Relation Age of Onset  . Stroke Mother  died age 87  . Diabetes Mother   . Breast cancer Mother 51  . Breast cancer Sister 70  . Breast cancer Paternal Aunt 51  . Diabetes Maternal Grandfather   . Breast cancer Paternal Grandmother 15  . Breast cancer Paternal Aunt     dx in her 67s  . Cancer Maternal Grandmother     intra-abdominal cancer  . Brain cancer Maternal Uncle 8  . Brain cancer Cousin 74    maternal cousin  . Brain cancer Cousin 20    paternal cousin    No Known Allergies  Current Outpatient Prescriptions on File Prior to Visit  Medication Sig Dispense Refill  . Biotin 10000 MCG TBDP Take by mouth.    . Calcium Carb-Cholecalciferol (CALCIUM-VITAMIN D) 500-400 MG-UNIT TABS Take by mouth.    . cholecalciferol (VITAMIN D) 1000 units tablet Take 2,000 Units by mouth daily.    . citalopram (CELEXA) 20 MG tablet Take 1 tablet by mouth  daily 90 tablet 1  . docusate sodium (COLACE) 100 MG capsule Take 100 mg by mouth 3 (three) times daily.     Marland Kitchen esomeprazole (NEXIUM) 40 MG capsule Take 1 capsule by mouth  daily 90 capsule 1  . levothyroxine (SYNTHROID, LEVOTHROID) 100 MCG tablet Take 1 tablet (100 mcg total) by mouth daily before breakfast. 90 tablet 1  . LUTEIN PO Take by mouth.    . meloxicam (MOBIC) 7.5 MG tablet Take 1 tablet (7.5 mg total) by mouth daily. 90 tablet 1  . methocarbamol (ROBAXIN) 500 MG tablet Take 500 mg by mouth every 6 (six) hours as needed for muscle spasms.      Marland Kitchen omega-3 acid ethyl esters (LOVAZA) 1 g capsule TAKE 1 CAPSULE TWICE DAILY 180 capsule 1  . pregabalin (LYRICA) 75 MG capsule Take 1 capsule (75 mg total) by mouth 2 (two) times daily. 180 capsule 1  . simvastatin (ZOCOR) 10 MG tablet Take 1 tablet (10 mg total) by mouth at bedtime. 90 tablet 1  . tamoxifen (NOLVADEX) 20 MG tablet Take 1 tablet (20 mg total) by mouth daily. 30 tablet 1  . temazepam (RESTORIL) 15 MG capsule Take 1 capsule (15 mg total) by mouth at bedtime as needed for sleep. 90 capsule 1  . traMADol (ULTRAM) 50 MG tablet Take 1-2 tablets (50-100 mg total) by mouth every 6 (six) hours as needed (mild pain). 80 tablet 1  . Wheat Dextrin (BENEFIBER PO) Take by mouth.     No current facility-administered medications on file prior to visit.     BP 118/62 (BP Location: Left Arm, Patient Position: Sitting, Cuff Size: Large)   Pulse 86   Temp 98.8 F (37.1 C) (Oral)   Ht 5' (1.524 m)   Wt 197 lb 3.2 oz (89.4 kg)   BMI 38.51 kg/m       Objective:   Physical Exam  Constitutional: She is oriented to person, place, and time. She appears well-developed and well-nourished.  Eyes: Pupils are equal, round, and reactive to light. No scleral icterus.  Neck: Neck supple.  Cardiovascular: Normal rate, regular rhythm and intact distal pulses.   Pulmonary/Chest: Effort normal and breath sounds normal. She has no wheezes. She has no rales.  Abdominal: Soft. Bowel sounds are normal. There is no tenderness.  Musculoskeletal: She exhibits no edema.  Lymphadenopathy:    She has no cervical adenopathy.  Neurological: She is alert and oriented to person, place, and time. Coordination normal.  Skin: Skin is warm and  dry.  Psychiatric: She has a normal mood and affect. Her behavior is normal. Judgment and thought content normal.        Assessment & Plan:  1. Prediabetes A1C remains in the prediabetes range however glucose levels are decreasing with dietary changes and exercise. Patient  is motivated to continue with dietary changes. We discussed the option of Metformin and how this medication works in the future. She does not wish to add any new medications to her list and is interested and motivated to manage this through dietary changes.  2. Hypothyroidism, unspecified type Continue current levothyroxine dose; Advised her to take this medication at least 1 hour prior to eating and to take this medication separately from her other medications. She is currently taking this medication appropriately.  3. Hyperlipidemia, unspecified hyperlipidemia type Continue simvastatin as prescribed with improved dietary choices.  4. Need for influenza vaccination  - Flu vaccine HIGH DOSE PF (Fluzone High dose)   5. History of urinary incontinence Patient requested referral to urology since history of Myrbetriq was not successful. This request was made when patient was checking out of office.   - Ambulatory referral to Urology  We reviewed medications, mechanisms of action, and purpose. She is interested in decreasing the number of medications that she is currently taking. She will try Miralax daily instead of Benefiber and colace. She also has medication refills at this time but is concerned about lack of coverage for Lyrica and Nexium. We discussed the possibility of using pantoprozole in place of Nexium and trial of gabapentin in lieu of Lyrica. She is interested in this trial and we will revisit this at her next visit since she currently has enough medication at this time.  Follow up in 3 to 4 months for assessment of multiple medical conditions and possible trial of gabapentine in lieu of Lyrica due to financial concerns.  Delano Metz, FNP-C

## 2016-08-23 NOTE — Progress Notes (Signed)
Pre visit review using our clinic review tool, if applicable. No additional management support is needed unless otherwise documented below in the visit note. 

## 2016-08-23 NOTE — Patient Instructions (Signed)
It was a pleasure to see you today! Please follow up in 3 to 4 months and we will consider changes to Nexium and Lyrica as discussed.    Please do not hesitate to let me know if you need anything!

## 2016-08-30 ENCOUNTER — Other Ambulatory Visit (HOSPITAL_BASED_OUTPATIENT_CLINIC_OR_DEPARTMENT_OTHER): Payer: Medicare HMO

## 2016-08-30 ENCOUNTER — Other Ambulatory Visit: Payer: Self-pay | Admitting: *Deleted

## 2016-08-30 DIAGNOSIS — C50511 Malignant neoplasm of lower-outer quadrant of right female breast: Secondary | ICD-10-CM

## 2016-08-30 LAB — CBC WITH DIFFERENTIAL/PLATELET
BASO%: 0.6 % (ref 0.0–2.0)
Basophils Absolute: 0 10*3/uL (ref 0.0–0.1)
EOS%: 2.9 % (ref 0.0–7.0)
Eosinophils Absolute: 0.2 10*3/uL (ref 0.0–0.5)
HCT: 41.1 % (ref 34.8–46.6)
HGB: 13.6 g/dL (ref 11.6–15.9)
LYMPH#: 1.9 10*3/uL (ref 0.9–3.3)
LYMPH%: 23.5 % (ref 14.0–49.7)
MCH: 29.6 pg (ref 25.1–34.0)
MCHC: 33.2 g/dL (ref 31.5–36.0)
MCV: 89.2 fL (ref 79.5–101.0)
MONO#: 0.7 10*3/uL (ref 0.1–0.9)
MONO%: 8.1 % (ref 0.0–14.0)
NEUT%: 64.9 % (ref 38.4–76.8)
NEUTROS ABS: 5.3 10*3/uL (ref 1.5–6.5)
PLATELETS: 196 10*3/uL (ref 145–400)
RBC: 4.6 10*6/uL (ref 3.70–5.45)
RDW: 14.2 % (ref 11.2–14.5)
WBC: 8.1 10*3/uL (ref 3.9–10.3)

## 2016-08-30 LAB — COMPREHENSIVE METABOLIC PANEL
ALT: 32 U/L (ref 0–55)
ANION GAP: 10 meq/L (ref 3–11)
AST: 22 U/L (ref 5–34)
Albumin: 3.3 g/dL — ABNORMAL LOW (ref 3.5–5.0)
Alkaline Phosphatase: 64 U/L (ref 40–150)
BILIRUBIN TOTAL: 0.42 mg/dL (ref 0.20–1.20)
BUN: 20.2 mg/dL (ref 7.0–26.0)
CO2: 24 meq/L (ref 22–29)
CREATININE: 0.7 mg/dL (ref 0.6–1.1)
Calcium: 9 mg/dL (ref 8.4–10.4)
Chloride: 104 mEq/L (ref 98–109)
EGFR: 81 mL/min/{1.73_m2} — ABNORMAL LOW (ref >90)
GLUCOSE: 112 mg/dL (ref 70–140)
Potassium: 4.2 mEq/L (ref 3.5–5.1)
Sodium: 139 mEq/L (ref 136–145)
TOTAL PROTEIN: 6.4 g/dL (ref 6.4–8.3)

## 2016-09-01 ENCOUNTER — Other Ambulatory Visit: Payer: Medicare HMO

## 2016-09-03 ENCOUNTER — Emergency Department (HOSPITAL_BASED_OUTPATIENT_CLINIC_OR_DEPARTMENT_OTHER): Payer: Medicare HMO

## 2016-09-03 ENCOUNTER — Encounter (HOSPITAL_BASED_OUTPATIENT_CLINIC_OR_DEPARTMENT_OTHER): Payer: Self-pay | Admitting: *Deleted

## 2016-09-03 ENCOUNTER — Emergency Department (HOSPITAL_BASED_OUTPATIENT_CLINIC_OR_DEPARTMENT_OTHER)
Admission: EM | Admit: 2016-09-03 | Discharge: 2016-09-03 | Disposition: A | Discharge status: Home / Self Care | Payer: Medicare HMO | Attending: Emergency Medicine | Admitting: Emergency Medicine

## 2016-09-03 DIAGNOSIS — Z853 Personal history of malignant neoplasm of breast: Secondary | ICD-10-CM | POA: Diagnosis not present

## 2016-09-03 DIAGNOSIS — M79652 Pain in left thigh: Secondary | ICD-10-CM | POA: Diagnosis not present

## 2016-09-03 DIAGNOSIS — J45909 Unspecified asthma, uncomplicated: Secondary | ICD-10-CM | POA: Insufficient documentation

## 2016-09-03 DIAGNOSIS — M7632 Iliotibial band syndrome, left leg: Secondary | ICD-10-CM | POA: Diagnosis not present

## 2016-09-03 DIAGNOSIS — Z87891 Personal history of nicotine dependence: Secondary | ICD-10-CM | POA: Insufficient documentation

## 2016-09-03 DIAGNOSIS — E039 Hypothyroidism, unspecified: Secondary | ICD-10-CM | POA: Insufficient documentation

## 2016-09-03 DIAGNOSIS — M79605 Pain in left leg: Secondary | ICD-10-CM | POA: Diagnosis present

## 2016-09-03 DIAGNOSIS — Z79899 Other long term (current) drug therapy: Secondary | ICD-10-CM | POA: Insufficient documentation

## 2016-09-03 DIAGNOSIS — M25552 Pain in left hip: Secondary | ICD-10-CM | POA: Diagnosis not present

## 2016-09-03 MED ORDER — CYCLOBENZAPRINE HCL 10 MG PO TABS: 10.0000 mg | ORAL_TABLET | Freq: Two times a day (BID) | ORAL | 0 refills | Status: DC | PRN

## 2016-09-03 MED ORDER — HYDROCODONE-ACETAMINOPHEN 5-325 MG PO TABS: 1.0000 | ORAL_TABLET | Freq: Four times a day (QID) | ORAL | 0 refills | Status: DC | PRN

## 2016-09-03 MED ORDER — HYDROMORPHONE HCL 1 MG/ML IJ SOLN
1.0000 mg | Freq: Once | INTRAMUSCULAR | Status: AC
  Administered 2016-09-03: 1 mg via INTRAMUSCULAR
  Filled 2016-09-03: qty 1

## 2016-09-03 MED FILL — CYCLOBENZAPRINE 10 MG TAB: 10 | 10 days supply | Qty: 20 | Fill #0

## 2016-09-03 MED FILL — HYDROCODON-APAP 5-325: 5-325 | 3 days supply | Qty: 15 | Fill #0

## 2016-09-03 NOTE — ED Triage Notes (Addendum)
Pt reports left upper leg pain and cramping, had a restless night, husband brought her here today for eval. This rn assists pt out of car and into w/c, brought directly to room 6 for triage and registration. Pain with movement and wb,noted, pt denies fall or injury. Pt states she took a flexeril around midnight with no relief.

## 2016-09-03 NOTE — ED Provider Notes (Signed)
[facility] Provider Note   CSN: [id number] Arrival date & time: 09/03/16  0803     History   Chief Complaint Chief Complaint  Patient presents with  . Leg Pain    HPI Jean Nash is a 75 y.o. female.  The history is provided by the patient and the spouse.  Leg Pain   This is a new problem. The current episode started 12 to 24 hours ago. The problem occurs constantly. The problem has been gradually worsening. The pain is present in the left upper leg. The quality of the pain is described as sharp and intermittent. The pain is at a severity of 10/10. The pain is severe. Associated symptoms include limited range of motion. Pertinent negatives include no numbness, no tingling and no itching. The symptoms are aggravated by standing and activity. Treatments tried: Tried muscle relaxer last night which did not help. Pain is completely resolved until she attempts to move her leg. The treatment provided no relief. There has been no history of extremity trauma. Family history is significant for no gout.    Past Medical History:  Diagnosis Date  . Anxiety    PHOBIAS  . Arthritis   . Asthma    hx of -no inhalers, no problems  . Breast cancer ([place]) 08/08/13   right LOQ  . Cataract   . Chronic insomnia   . Cluster headaches    history of migraines / NONE FOR SEVERAL YRS  . Depression   . Fibromyalgia   . GERD (gastroesophageal reflux disease)   . H/O hiatal hernia   . History of colonic polyps   . History of transfusion   . Hx of radiation therapy 10/29/13- 12/14/13   right chest wall 5040 cGy 28 sessions, right supraclavicular/axillary region 5040 cGy 28 sessions, right chest wall boost 1000 cGy 5 sessions  . Hypothyroidism   . Irritable bowel syndrome   . Kidney stone   . Lymphedema    RT ARM - WEARS SLEEVE  . Macular degeneration   . Osteopenia   . Other abnormal glucose   . Other and unspecified hyperlipidemia   . Pain in joint, shoulder region   .  Pneumonia [id number]  . Sleep apnea    USES C-PAP  . Stress incontinence, female     Patient Active Problem List   Diagnosis Date Noted  . OA (osteoarthritis) of knee 11/03/2015  . Osteopenia 08/08/2015  . Chronic insomnia 08/08/2015  . Obstructive sleep apnea 06/25/2015  . Headache 02/03/2015  . Atypical chest pain 07/15/2014  . Arthralgia 02/22/2014  . Psoriasis 02/22/2014  . Breast cancer of lower-outer quadrant of right female breast ([place]) 08/09/2013  . Neck pain 06/22/2013  . Left knee pain 11/29/2012  . Gastroenteritis 04/26/2012  . Reactive depression (situational) 04/26/2012  . Right wrist pain 02/02/2012  . Right foot pain 10/04/2011  . Nevus 06/05/2011  . Status post total knee replacement 04/06/2011  . Overactive bladder 03/14/2011  . KNEE PAIN, LEFT 08/31/2010  . HEARING LOSS 08/17/2010  . PSORIASIS 02/26/2010  . HIRSUTISM 06/16/2009  . CYST, IDIOPATHIC 05/20/2008  . IRRITABLE BOWEL SYNDROME 04/15/2008  . Asthma 03/19/2008  . GERD 03/19/2008  . COLONIC POLYPS, HX OF 03/19/2008  . NEPHROLITHIASIS, HX OF 03/19/2008  . Hypothyroidism 03/18/2008  . Hyperlipidemia 03/18/2008  . INSOMNIA, CHRONIC 03/18/2008  . FIBROMYALGIA 03/18/2008  . Prediabetes 03/18/2008    Past Surgical History:  Procedure Laterality Date  . ABDOMINAL HYSTERECTOMY    . APPENDECTOMY    .  BILATERAL TOTAL MASTECTOMY WITH AXILLARY LYMPH NODE DISSECTION  08/30/2013   Dr Barry Dienes  . BREAST CYST ASPIRATION     9 cysts  . CATARACT EXTRACTION, BILATERAL    . CHOLECYSTECTOMY    . COLONOSCOPY    . EVACUATION BREAST HEMATOMA Left 08/31/2013   Procedure: EVACUATION HEMATOMA BREAST;  Surgeon: Stark Klein, MD;  Location: [facility];  Service: General;  Laterality: Left;  . EYE SURGERY     to repair macular hole  . FOOT ARTHROPLASTY     lt   . GANGLION CYST EXCISION     rt foot  . HEMORRHOID SURGERY     03/1993  . JOINT REPLACEMENT  03/15/11   left knee replacement  . KNEE ARTHROSCOPY     > 10  years ago  . MASS EXCISION  11/04/2011   Procedure: EXCISION MASS;  Surgeon: Cammie Sickle., MD;  Location: [facility];  Service: Orthopedics;  Laterality: Right;  excisional biopsy right ulna mass  . MASTECTOMY W/ SENTINEL NODE BIOPSY Right 08/30/2013   Procedure: RIGHT  AXILLARY SENTINEL LYMPH NODE BIOPSY; Right Axillary Node Disection;  Surgeon: Stark Klein, MD;  Location: [facility];  Service: General;  Laterality: Right;  Right side nuc med 7:00   . PARTIAL KNEE ARTHROPLASTY Right 11/03/2015   Procedure: RIGHT KNEE MEDIAL UNICOMPARTMENTAL ARTHROPLASTY ;  Surgeon: Gaynelle Arabian, MD;  Location: [facility];  Service: Orthopedics;  Laterality: Right;  . SIMPLE MASTECTOMY WITH AXILLARY SENTINEL NODE BIOPSY Left 08/30/2013   Procedure: Bilateral Breast Mastectomy ;  Surgeon: Stark Klein, MD;  Location: [facility];  Service: General;  Laterality: Left;  . skin tags removed     breast, panty line, neckline  . TOE SURGERY     preventative crossover toe surg/right foot  . TOE SURGERY     left foot/screw  in 2nd toe  . TONSILLECTOMY    . UPPER GASTROINTESTINAL ENDOSCOPY      OB History    No data available       Home Medications    Prior to Admission medications   Medication Sig Start Date End Date Taking? Authorizing Provider  Biotin 10000 MCG TBDP Take by mouth.    Historical Provider, MD  Calcium Carb-Cholecalciferol (CALCIUM-VITAMIN D) 500-400 MG-UNIT TABS Take by mouth.    Historical Provider, MD  cholecalciferol (VITAMIN D) 1000 units tablet Take 2,000 Units by mouth daily.    Historical Provider, MD  citalopram (CELEXA) 20 MG tablet Take 1 tablet by mouth  daily 06/22/16   Delano Metz, FNP  cyclobenzaprine (FLEXERIL) 10 MG tablet Take 1 tablet (10 mg total) by mouth 2 (two) times daily as needed for muscle spasms. 09/03/16   Blanchie Dessert, MD  docusate sodium (COLACE) 100 MG capsule Take 100 mg by mouth 3 (three) times daily.     Historical Provider, MD  esomeprazole  (NEXIUM) 40 MG capsule Take 1 capsule by mouth  daily 01/30/16   Doe-Hyun R Shawna Orleans, DO  HYDROcodone-acetaminophen (NORCO/VICODIN) 5-325 MG tablet Take 1-2 tablets by mouth every 6 (six) hours as needed. 09/03/16   Blanchie Dessert, MD  levothyroxine (SYNTHROID, LEVOTHROID) 100 MCG tablet Take 1 tablet (100 mcg total) by mouth daily before breakfast. 07/08/16   Delano Metz, FNP  LUTEIN PO Take by mouth.    Historical Provider, MD  meloxicam (MOBIC) 7.5 MG tablet Take 1 tablet (7.5 mg total) by mouth daily. 01/26/16   Doe-Hyun R Shawna Orleans, DO  methocarbamol (ROBAXIN) 500 MG tablet Take  500 mg by mouth every 6 (six) hours as needed for muscle spasms.    Historical Provider, MD  omega-3 acid ethyl esters (LOVAZA) 1 g capsule TAKE 1 CAPSULE TWICE DAILY 07/06/16   Delano Metz, FNP  pregabalin (LYRICA) 75 MG capsule Take 1 capsule (75 mg total) by mouth 2 (two) times daily. 01/26/16   Doe-Hyun R Shawna Orleans, DO  simvastatin (ZOCOR) 10 MG tablet Take 1 tablet (10 mg total) by mouth at bedtime. 07/08/16   Delano Metz, FNP  tamoxifen (NOLVADEX) 20 MG tablet Take 1 tablet (20 mg total) by mouth daily. 03/10/16   Chauncey Cruel, MD  temazepam (RESTORIL) 15 MG capsule Take 1 capsule (15 mg total) by mouth at bedtime as needed for sleep. 01/26/16   Doe-Hyun R Shawna Orleans, DO  traMADol (ULTRAM) 50 MG tablet Take 1-2 tablets (50-100 mg total) by mouth every 6 (six) hours as needed (mild pain). 11/04/15   Arlee Muslim, PA-C  Wheat Dextrin (BENEFIBER PO) Take by mouth.    Historical Provider, MD    Family History Family History  Problem Relation Age of Onset  . Stroke Mother     died age 71  . Diabetes Mother   . Breast cancer Mother 28  . Breast cancer Sister 29  . Breast cancer Paternal Aunt 78  . Diabetes Maternal Grandfather   . Breast cancer Paternal Grandmother 66  . Breast cancer Paternal Aunt     dx in her 68s  . Cancer Maternal Grandmother     intra-abdominal cancer  . Brain cancer Maternal Uncle 8  . Brain  cancer Cousin 38    maternal cousin  . Brain cancer Cousin 20    paternal cousin    Social History Social History  Substance Use Topics  . Smoking status: Former Smoker    Packs/day: 0.10    Years: 2.00    Types: Cigarettes    Start date: 11/16/1959    Quit date: 11/15/1960  . Smokeless tobacco: Never Used  . Alcohol use No     Allergies   Review of patient's allergies indicates no known allergies.   Review of Systems Review of Systems  Skin: Negative for itching.  Neurological: Negative for tingling and numbness.  All other systems reviewed and are negative.    Physical Exam Updated Vital Signs BP 122/55 (BP Location: Left Arm)   Pulse 74   Temp 97.9 F (36.6 C) (Oral)   Resp 20   Ht 5' (1.524 m)   Wt 193 lb (87.5 kg)   SpO2 97%   BMI 37.69 kg/m   Physical Exam  Constitutional: She appears well-developed and well-nourished. No distress.  HENT:  Head: Normocephalic and atraumatic.  Eyes: EOM are normal. Pupils are equal, round, and reactive to light.  Cardiovascular: Normal rate and intact distal pulses.   Pulmonary/Chest: Effort normal.  Musculoskeletal: She exhibits tenderness.       Legs: No pain with palpation of the hip or lower back.  Neurological: She is alert. She has normal strength. No sensory deficit.  Skin: Skin is warm and dry. Capillary refill takes less than 2 seconds.  Nursing note and vitals reviewed.    ED Treatments / Results  Labs (all labs ordered are listed, but only abnormal results are displayed) Labs Reviewed - No data to display  EKG  EKG Interpretation None       Radiology Dg Hip Unilat W Or Wo Pelvis 2-3 Views Left  Result Date: 09/03/2016 CLINICAL DATA:  Lateral LEFT femoral pain beginning yesterday while walking, unable to lie on LEFT side last night while sleeping EXAM: DG HIP (WITH OR WITHOUT PELVIS) 2-3V LEFT COMPARISON:  None FINDINGS: Osseous demineralization. Hip and SI joint spaces fairly well preserved for  age. No acute fracture, dislocation, or bone destruction. IMPRESSION: Osseous demineralization. No acute abnormalities. Electronically Signed   By: Lavonia Dana M.D.   On: 09/03/2016 09:05   Dg Femur Min 2 Views Left  Result Date: 09/03/2016 CLINICAL DATA:  Lateral LEFT frontal pain beginning yesterday after walking, unable to lie on LEFT side while sleeping last night EXAM: LEFT FEMUR 2 VIEWS COMPARISON:  None. FINDINGS: Osseous demineralization. Hip reported separately. Components of LEFT knee prosthesis identified. No acute fracture, dislocation, or bone destruction. No periprosthetic lucency. Soft tissues radiographically unremarkable. IMPRESSION: Osseous demineralization and LEFT knee prosthesis without acute bony abnormalities. Electronically Signed   By: Lavonia Dana M.D.   On: 09/03/2016 09:23    Procedures Procedures (including critical care time)  Medications Ordered in ED Medications  HYDROmorphone (DILAUDID) injection 1 mg (1 mg Intramuscular Given 09/03/16 0901)     Initial Impression / Assessment and Plan / ED Course  I have reviewed the triage vital signs and the nursing notes.  Pertinent labs & imaging results that were available during my care of the patient were reviewed by me and considered in my medical decision making (see chart for details).  Clinical Course   Patient is an elderly female with obesity presenting today with left lateral leg pain that started yesterday and worsened throughout the night. Patient denies any fall or traumatic event. However she does recall she walked a lot more yesterday based on her fit bit 2000 more feet and she has chronic right knee pain so is not sure how she walks. She denies any swelling, bruising or redness to the leg. She has no significant knee or hip pain. No back pain. She denies any fever or infectious symptoms. On exam patient has significant tenderness along the left IT band. No notable hip tenderness. No medial 5 tenderness or  swelling. There is no induration, fluctuance or erythema. She has no back pain. Sensation and pulse are intact distally down to the foot. Imaging of the left hip and thigh without acute findings. Seems to be irritation of the iliotibial band. There is no signs concerning for DVT, septic hip or knee pathology. No acute fractures and she denies any traumatic events. After IM medication she feels much better. Will give pain control she also has Voltaren gel and another lidocaine type cream at home that she can try. Gave her exercises and encouraged her to follow-up with her PCP.  Final Clinical Impressions(s) / ED Diagnoses   Final diagnoses:  It band syndrome, left    New Prescriptions New Prescriptions   CYCLOBENZAPRINE (FLEXERIL) 10 MG TABLET    Take 1 tablet (10 mg total) by mouth 2 (two) times daily as needed for muscle spasms.   HYDROCODONE-ACETAMINOPHEN (NORCO/VICODIN) 5-325 MG TABLET    Take 1-2 tablets by mouth every 6 (six) hours as needed.     Blanchie Dessert, MD 09/03/16 1037

## 2016-09-08 ENCOUNTER — Ambulatory Visit (HOSPITAL_BASED_OUTPATIENT_CLINIC_OR_DEPARTMENT_OTHER): Payer: Medicare HMO | Admitting: Oncology

## 2016-09-08 VITALS — BP 125/60 | HR 70 | Temp 98.3°F | Resp 18 | Ht 60.0 in | Wt 195.9 lb

## 2016-09-08 DIAGNOSIS — Z170 Estrogen receptor positive status [ER+]: Secondary | ICD-10-CM

## 2016-09-08 DIAGNOSIS — M8580 Other specified disorders of bone density and structure, unspecified site: Secondary | ICD-10-CM

## 2016-09-08 DIAGNOSIS — C50511 Malignant neoplasm of lower-outer quadrant of right female breast: Secondary | ICD-10-CM | POA: Diagnosis not present

## 2016-09-08 DIAGNOSIS — M25552 Pain in left hip: Secondary | ICD-10-CM | POA: Diagnosis not present

## 2016-09-08 DIAGNOSIS — R635 Abnormal weight gain: Secondary | ICD-10-CM

## 2016-09-08 DIAGNOSIS — R609 Edema, unspecified: Secondary | ICD-10-CM

## 2016-09-08 MED ORDER — TAMOXIFEN CITRATE 20 MG PO TABS: 20.0000 mg | ORAL_TABLET | Freq: Every day | ORAL | 4 refills | Status: DC

## 2016-09-08 NOTE — Progress Notes (Signed)
ID: Jean Nash OB: 08/02/1941  MR#: [id number]  [id number]#:[id number]  PCP: Laurita Quint, FNP GYN:  Marylynn Pearson SU: Stark Klein OTHER MD: Arloa Koh, Delrae Rend, Scott MacDiarmid, Venancio Poisson, Jolayne Panther Supple  CHIEF COMPLAINT: Estrogen receptor positive breast cancer   CURRENT TREATMENT: Tamoxifen   BREAST CANCER HISTORY: From the original intake note:   Jean Nash had routine screening mammography a08/18/2014 showing no suspicious findings. However she was found to have breast density category C. She researched this, was alarmed by the fact that mammographic sensitivity is significantly decreased when the breasts are dense, and given her family history of breast cancer she opted for proceeding to bilateral breast MRI. This was performed at [facility] 08/01/2013. It showed in the right breast an area of nodular and linear enhancement spanning approximately 8.4 cm. There were no abnormal appearing lymph nodes and the left breast was unremarkable.  Biopsy of an area around the middle of the abnormal section of the right breast on 08/08/2013, showed ([id number]) an invasive and in situ ductal carcinoma, the invasive tumor being grade 1, estrogen receptor 90% positive, progesterone receptor 90% positive, with an MIB-105%. HER-2 testing is pending.  The patient's subsequent history is as detailed below   INTERVAL HISTORY: Jean Nash returns today for followup of her breast cancer accompanied by her husband Jean Nash. She continues on tamoxifen, which she generally tolerates well, except for problems with hot flashes. These do occasionally wake her up. She obtains a drug at a good price.  REVIEW OF SYSTEMS: She develops severe pain in the left hip the night of 09/02/2016 which took her to the emergency room the next day. She had extensive workup which showed no abnormality in the bones. She was treated with narcotics which made her very constipated and confused. The pain has  now resolved and she is off those medications. She does have  Continuing knee pain, and that keeps her from walking. She is for concerned because she has been gaining weight. She is now doing her recumbent bike a little, 5 minutes at a time. She gets short of breath when walking upstairs. She has a dry cough, not associated with sinus symptoms. She thinks her vision is getting worse and she has a poor sense of balance. She has pain in her back and other joints but it is not more persistent or intense than prior. She continues to have severe urinary leakage symptoms and did not get any benefit from referral to urology. "It was not a good experience". Aside from these issues a detailed review of systems today was stable   PAST MEDICAL HISTORY: Past Medical History:  Diagnosis Date  . Anxiety    PHOBIAS  . Arthritis   . Asthma    hx of -no inhalers, no problems  . Breast cancer ([place]) 08/08/13   right LOQ  . Cataract   . Chronic insomnia   . Cluster headaches    history of migraines / NONE FOR SEVERAL YRS  . Depression   . Fibromyalgia   . GERD (gastroesophageal reflux disease)   . H/O hiatal hernia   . History of colonic polyps   . History of transfusion   . Hx of radiation therapy 10/29/13- 12/14/13   right chest wall 5040 cGy 28 sessions, right supraclavicular/axillary region 5040 cGy 28 sessions, right chest wall boost 1000 cGy 5 sessions  . Hypothyroidism   . Irritable bowel syndrome   . Kidney stone   . Lymphedema  RT ARM - WEARS SLEEVE  . Macular degeneration   . Osteopenia   . Other abnormal glucose   . Other and unspecified hyperlipidemia   . Pain in joint, shoulder region   . Pneumonia 0565,4996  . Sleep apnea    USES C-PAP  . Stress incontinence, female     PAST SURGICAL HISTORY: Past Surgical History:  Procedure Laterality Date  . ABDOMINAL HYSTERECTOMY    . APPENDECTOMY    . BILATERAL TOTAL MASTECTOMY WITH AXILLARY LYMPH NODE DISSECTION  08/30/2013   Dr Donell Beers   . BREAST CYST ASPIRATION     9 cysts  . CATARACT EXTRACTION, BILATERAL    . CHOLECYSTECTOMY    . COLONOSCOPY    . EVACUATION BREAST HEMATOMA Left 08/31/2013   Procedure: EVACUATION HEMATOMA BREAST;  Surgeon: Almond Lint, MD;  Location: [facility];  Service: General;  Laterality: Left;  . EYE SURGERY     to repair macular hole  . FOOT ARTHROPLASTY     lt   . GANGLION CYST EXCISION     rt foot  . HEMORRHOID SURGERY     03/1993  . JOINT REPLACEMENT  03/15/11   left knee replacement  . KNEE ARTHROSCOPY     > 10 years ago  . MASS EXCISION  11/04/2011   Procedure: EXCISION MASS;  Surgeon: Wyn Forster., MD;  Location: [facility];  Service: Orthopedics;  Laterality: Right;  excisional biopsy right ulna mass  . MASTECTOMY W/ SENTINEL NODE BIOPSY Right 08/30/2013   Procedure: RIGHT  AXILLARY SENTINEL LYMPH NODE BIOPSY; Right Axillary Node Disection;  Surgeon: Almond Lint, MD;  Location: [facility];  Service: General;  Laterality: Right;  Right side nuc med 7:00   . PARTIAL KNEE ARTHROPLASTY Right 11/03/2015   Procedure: RIGHT KNEE MEDIAL UNICOMPARTMENTAL ARTHROPLASTY ;  Surgeon: Ollen Gross, MD;  Location: [facility];  Service: Orthopedics;  Laterality: Right;  . SIMPLE MASTECTOMY WITH AXILLARY SENTINEL NODE BIOPSY Left 08/30/2013   Procedure: Bilateral Breast Mastectomy ;  Surgeon: Almond Lint, MD;  Location: [facility];  Service: General;  Laterality: Left;  . skin tags removed     breast, panty line, neckline  . TOE SURGERY     preventative crossover toe surg/right foot  . TOE SURGERY     left foot/screw  in 2nd toe  . TONSILLECTOMY    . UPPER GASTROINTESTINAL ENDOSCOPY      FAMILY HISTORY Family History  Problem Relation Age of Onset  . Stroke Mother     died age 33  . Diabetes Mother   . Breast cancer Mother 78  . Breast cancer Sister 27  . Breast cancer Paternal Aunt 12  . Diabetes Maternal Grandfather   . Breast cancer Paternal Grandmother 67  . Breast cancer  Paternal Aunt     dx in her 75s  . Cancer Maternal Grandmother     intra-abdominal cancer  . Brain cancer Maternal Uncle 8  . Brain cancer Cousin 59    maternal cousin  . Brain cancer Cousin 20    paternal cousin   the patient's father lived to be 68. The patient's mother was diagnosed with breast cancer at age 35. She died at 47 from unrelated causes. The patient had no brothers, one sister there are multiple second degree relatives with breast cancer, but no history of ovarian cancer in the family.  GYNECOLOGIC HISTORY:  Menarche age 25, first live birth age 49, she is GX P3. She had a  hysterectomy at age 13. She did not take hormone replacement. She took birth control perhaps for 2 years, in the mid-22s. She did not have any complications from that.  SOCIAL HISTORY:  She is a retired Radiation protection practitioner. Her husband Jean Nash used to be a Heritage manager. Daughter Caoimhe Damron is a pediatrician in [place]. Daughter Leamon Arnt is a Engineer, water in [place]. Daughter Evlyn Courier is  a therapist living in [place]. The patient has 12 grandchildren. She attends the local syn[employer]    ADVANCED DIRECTIVES: In place   HEALTH MAINTENANCE: Social History  Substance Use Topics  . Smoking status: Former Smoker    Packs/day: 0.10    Years: 2.00    Types: Cigarettes    Start date: 11/16/1959    Quit date: 11/15/1960  . Smokeless tobacco: Never Used  . Alcohol use No     Colonoscopy: April 2014 PAP: Status post hysterectomy  Bone density: January 2014 Lipid panel:  No Known Allergies  Current Outpatient Prescriptions  Medication Sig Dispense Refill  . Biotin 10000 MCG TBDP Take by mouth.    . Calcium Carb-Cholecalciferol (CALCIUM-VITAMIN D) 500-400 MG-UNIT TABS Take by mouth.    . cholecalciferol (VITAMIN D) 1000 units tablet Take 2,000 Units by mouth daily.    Marland Kitchen esomeprazole (NEXIUM) 40 MG capsule Take 1 capsule by mouth  daily 90 capsule 1  . levothyroxine (SYNTHROID, LEVOTHROID)  100 MCG tablet Take 1 tablet (100 mcg total) by mouth daily before breakfast. 90 tablet 1  . LUTEIN PO Take by mouth.    . meloxicam (MOBIC) 7.5 MG tablet Take 1 tablet (7.5 mg total) by mouth daily. 90 tablet 1  . methocarbamol (ROBAXIN) 500 MG tablet Take 500 mg by mouth every 6 (six) hours as needed for muscle spasms.    Marland Kitchen omega-3 acid ethyl esters (LOVAZA) 1 g capsule TAKE 1 CAPSULE TWICE DAILY 180 capsule 1  . pregabalin (LYRICA) 75 MG capsule Take 1 capsule (75 mg total) by mouth 2 (two) times daily. 180 capsule 1  . simvastatin (ZOCOR) 10 MG tablet Take 1 tablet (10 mg total) by mouth at bedtime. 90 tablet 1  . tamoxifen (NOLVADEX) 20 MG tablet Take 1 tablet (20 mg total) by mouth daily. 90 tablet 4  . temazepam (RESTORIL) 15 MG capsule Take 1 capsule (15 mg total) by mouth at bedtime as needed for sleep. 90 capsule 1  . traMADol (ULTRAM) 50 MG tablet Take 1-2 tablets (50-100 mg total) by mouth every 6 (six) hours as needed (mild pain). 80 tablet 1  . Wheat Dextrin (BENEFIBER PO) Take by mouth.     No current facility-administered medications for this visit.     OBJECTIVE: Middle-aged white woman who appears stated age 75:   09/08/16 1045  BP: 125/60  Pulse: 70  Resp: 18  Temp: 98.3 F (36.8 C)     Body mass index is 38.26 kg/m.    ECOG FS:1 - Symptomatic but completely ambulatory  Sclerae unicteric, EOMs intact Oropharynx clear and moist No cervical or supraclavicular adenopathy Lungs no rales or rhonchi Heart regular rate and rhythm Abd soft, obese,nontender, positive bowel sounds MSK no focal spinal tenderness, minimal right upper extremity lymphedema Neuro: nonfocal, well oriented, appropriate affect Breasts: status post bilateral mastectomies. There is no evidence of chest wall recurrence. Both axillae are benign.   LAB RESULTS:  CMP     Component Value Date/Time   NA 139 08/30/2016 1056   K 4.2 08/30/2016 1056  CL 106 08/16/2016 0850   CO2 24 08/30/2016  1056   GLUCOSE 112 08/30/2016 1056   BUN 20.2 08/30/2016 1056   CREATININE 0.7 08/30/2016 1056   CALCIUM 9.0 08/30/2016 1056   PROT 6.4 08/30/2016 1056   ALBUMIN 3.3 (L) 08/30/2016 1056   AST 22 08/30/2016 1056   ALT 32 08/30/2016 1056   ALKPHOS 64 08/30/2016 1056   BILITOT 0.42 08/30/2016 1056   GFRNONAA >60 11/04/2015 0410   GFRAA >60 11/04/2015 0410    I No results found for: SPEP  Lab Results  Component Value Date   WBC 8.1 08/30/2016   NEUTROABS 5.3 08/30/2016   HGB 13.6 08/30/2016   HCT 41.1 08/30/2016   MCV 89.2 08/30/2016   PLT 196 08/30/2016      Chemistry      Component Value Date/Time   NA 139 08/30/2016 1056   K 4.2 08/30/2016 1056   CL 106 08/16/2016 0850   CO2 24 08/30/2016 1056   BUN 20.2 08/30/2016 1056   CREATININE 0.7 08/30/2016 1056      Component Value Date/Time   CALCIUM 9.0 08/30/2016 1056   ALKPHOS 64 08/30/2016 1056   AST 22 08/30/2016 1056   ALT 32 08/30/2016 1056   BILITOT 0.42 08/30/2016 1056      No results found for: LABCA2  No components found for: LABCA125  No results for input(s): INR in the last 168 hours.  Urinalysis    Component Value Date/Time   COLORURINE YELLOW 10/28/2015 [place] 10/28/2015 1356   LABSPEC 1.011 10/28/2015 1356   PHURINE 6.0 10/28/2015 1356   GLUCOSEU NEGATIVE 10/28/2015 1356   GLUCOSEU NEGATIVE 09/26/2015 1231   HGBUR NEGATIVE 10/28/2015 1356   BILIRUBINUR n 05/17/2016 1046   KETONESUR NEGATIVE 10/28/2015 1356   PROTEINUR n 05/17/2016 1046   PROTEINUR NEGATIVE 10/28/2015 1356   UROBILINOGEN 0.2 05/17/2016 1046   UROBILINOGEN 0.2 09/26/2015 1231   NITRITE n 05/17/2016 1046   NITRITE NEGATIVE 10/28/2015 1356   LEUKOCYTESUR Negative 05/17/2016 1046    STUDIES: Dg Hip Unilat W Or Wo Pelvis 2-3 Views Left  Result Date: 09/03/2016 CLINICAL DATA:  Lateral LEFT femoral pain beginning yesterday while walking, unable to lie on LEFT side last night while sleeping EXAM: DG HIP  (WITH OR WITHOUT PELVIS) 2-3V LEFT COMPARISON:  None FINDINGS: Osseous demineralization. Hip and SI joint spaces fairly well preserved for age. No acute fracture, dislocation, or bone destruction. IMPRESSION: Osseous demineralization. No acute abnormalities. Electronically Signed   By: Lavonia Dana M.D.   On: 09/03/2016 09:05   Dg Femur Min 2 Views Left  Result Date: 09/03/2016 CLINICAL DATA:  Lateral LEFT frontal pain beginning yesterday after walking, unable to lie on LEFT side while sleeping last night EXAM: LEFT FEMUR 2 VIEWS COMPARISON:  None. FINDINGS: Osseous demineralization. Hip reported separately. Components of LEFT knee prosthesis identified. No acute fracture, dislocation, or bone destruction. No periprosthetic lucency. Soft tissues radiographically unremarkable. IMPRESSION: Osseous demineralization and LEFT knee prosthesis without acute bony abnormalities. Electronically Signed   By: Lavonia Dana M.D.   On: 09/03/2016 09:23     ASSESSMENT: 75 y.o. BRCA negative Welcome woman status post right breast lower outer quadrant biopsy 08/08/2013 for a clinical T2 N0, stage IIA invasive ductal carcinoma, grade 1, estrogen receptor 90% positive, progesterone receptor 90% positive, with an MIB-1 of 12% and HER-2 negative  (1) status post bilateral mastectomies with right axillary lymph node dissection 08/30/2013 showing a right-sided pT1a pN2, stage  IIIA invasive ductal carcinoma, grade 1, with negative margins  (2) given the marginal benefit in terms of mortality risk reduction from adjuvant chemotherapy, the patient decided to forego that option  (3) adjuvant radiation completed 12/14/2013  (4) tamoxifen started February 2015  (5) osteopenia with a T score of -2.2 on bone density January 2014  (a) repeat bone density 08/08/2015 showed a T score of -1.4weight problem  (6) carries a VUS in CHEK2 [CHEK2 c.320-5T>A]  PLAN: Amany Is now 3 years out from definitive surgery for her breast  cancer with no evidence of disease recurrence. This is very favorable area  She is tolerating tamoxifen well and the plan will be to continue that for total of 5 years.  I think she would benefit from a physical therapy consult for her right upper extremity and right anterior chest swelling. Even though this is minimal I think she could improvement.  Also she is very concerned about weight issues. We are putting in a dietitian referral. I also gave her information on the [employer] program. We discussed tai chi which she does not like. She does have a recumbent bike and we discussed how to use it.  I think a dry cough is most likely going to be due to reflux issues. She does have a history of a hiatal hernia. She will take her Nexium as prescribed.  Otherwise she probably had bursitis as the cause of her left hip pain. If it happens again she should call her primary doctor and consider steroids for a brief course.  At this point I feel comfortable seeing her on a once a year schedule. She knows to call for any problems that may developbefore her next visit Jean Cruel, MD   09/08/2016 11:17 AM is oriented

## 2016-09-09 ENCOUNTER — Encounter: Payer: Self-pay | Admitting: Adult Health

## 2016-09-09 ENCOUNTER — Ambulatory Visit (INDEPENDENT_AMBULATORY_CARE_PROVIDER_SITE_OTHER): Payer: Medicare HMO | Admitting: Adult Health

## 2016-09-09 VITALS — BP 132/80 | Temp 98.6°F | Ht 60.0 in | Wt 196.2 lb

## 2016-09-09 DIAGNOSIS — E039 Hypothyroidism, unspecified: Secondary | ICD-10-CM | POA: Diagnosis not present

## 2016-09-09 DIAGNOSIS — E785 Hyperlipidemia, unspecified: Secondary | ICD-10-CM | POA: Diagnosis not present

## 2016-09-09 DIAGNOSIS — Z7689 Persons encountering health services in other specified circumstances: Secondary | ICD-10-CM | POA: Diagnosis not present

## 2016-09-09 DIAGNOSIS — K219 Gastro-esophageal reflux disease without esophagitis: Secondary | ICD-10-CM | POA: Diagnosis not present

## 2016-09-09 DIAGNOSIS — R7303 Prediabetes: Secondary | ICD-10-CM

## 2016-09-09 NOTE — Progress Notes (Signed)
Patient presents to clinic today to establish care. He is a pleasant 75 year old female who  has a past medical history of Anxiety; Arthritis; Asthma; Breast cancer ([place]) (08/08/13); Cataract; Chronic insomnia; Cluster headaches; Depression; Fibromyalgia; GERD (gastroesophageal reflux disease); H/O hiatal hernia; History of colonic polyps; History of transfusion; radiation therapy (10/29/13- 12/14/13); Hypothyroidism; Irritable bowel syndrome; Kidney stone; Lymphedema; Macular degeneration; Osteopenia; Other abnormal glucose; Other and unspecified hyperlipidemia; Pain in joint, shoulder region; Pneumonia ([id number]); Sleep apnea; and Stress incontinence, female.   Acute Concerns: TMTM    Chronic Issues: Hypothyroidism  She feels as though she is well controlled on daily 19mg synthroid. She denies any signs of hypothyroidism   GERD - She feels as though this is well controlled with diet and Nexium   Hyperlipidemia  - She reports that she takes simvastain daily and lovasa for hyperlipidemia. She does have poor dietary habits. Has muscle cramping with swimming but this only happens on occasion. She does stretching exercises which help  Prediabetes Her last last A1c was 6.3 in October of this year. These results do indicate that patient is in the prediabetes range. She reports that she does have a poor diet and has noticed weight gain. She denies polyphagia, polydipsia, and polyuria. She does not monitor her blood sugar at home on a consistent basis.     Health Maintenance: Dental --Routine  Vision --Routine  Immunizations -- UTD  Colonoscopy -- 2013 - every 5 years  Mammogram -- Yearly  PAP -- No longer needs  Bone Density -- 2016   Has been cancer free for the last three years    Past Medical History:  Diagnosis Date  . Anxiety    PHOBIAS  . Arthritis   . Asthma    hx of -no inhalers, no problems  . [place]   right LOQ  . Cataract   . Chronic  insomnia   . Cluster headaches    history of migraines / NONE FOR SEVERAL YRS  . Depression   . Fibromyalgia   . GERD (gastroesophageal reflux disease)   . H/O hiatal hernia   . History of colonic polyps   . History of transfusion   . Hx of radiation therapy 10/29/13- 12/14/13   right chest wall 5040 cGy 28 sessions, right supraclavicular/axillary region 5040 cGy 28 sessions, right chest wall boost 1000 cGy 5 sessions  . Hypothyroidism   . Irritable bowel syndrome   . Kidney stone   . Lymphedema    RT ARM - WEARS SLEEVE  . Macular degeneration   . Osteopenia   . Other abnormal glucose   . Other and unspecified hyperlipidemia   . Pain in joint, shoulder region   . Pneumonia 14142,[id number] . Sleep apnea    USES C-PAP  . Stress incontinence, female     Past Surgical History:  Procedure Laterality Date  . ABDOMINAL HYSTERECTOMY    . APPENDECTOMY    . BILATERAL TOTAL MASTECTOMY WITH AXILLARY LYMPH NODE DISSECTION  08/30/2013   Dr BBarry Dienes . BREAST CYST ASPIRATION     9 cysts  . CATARACT EXTRACTION, BILATERAL    . CHOLECYSTECTOMY    . COLONOSCOPY    . EVACUATION BREAST HEMATOMA Left 08/31/2013   Procedure: EVACUATION HEMATOMA BREAST;  Surgeon: FStark Klein MD;  Location: [facility]  Service: General;  Laterality: Left;  . EYE SURGERY     to repair macular hole  . FOOT ARTHROPLASTY  lt   . GANGLION CYST EXCISION     rt foot  . HEMORRHOID SURGERY     03/1993  . JOINT REPLACEMENT  03/15/11   left knee replacement  . KNEE ARTHROSCOPY     > 10 years ago  . MASS EXCISION  11/04/2011   Procedure: EXCISION MASS;  Surgeon: Cammie Sickle., MD;  Location: [facility];  Service: Orthopedics;  Laterality: Right;  excisional biopsy right ulna mass  . MASTECTOMY W/ SENTINEL NODE BIOPSY Right 08/30/2013   Procedure: RIGHT  AXILLARY SENTINEL LYMPH NODE BIOPSY; Right Axillary Node Disection;  Surgeon: Stark Klein, MD;  Location: [facility];  Service: General;  Laterality:  Right;  Right side nuc med 7:00   . PARTIAL KNEE ARTHROPLASTY Right 11/03/2015   Procedure: RIGHT KNEE MEDIAL UNICOMPARTMENTAL ARTHROPLASTY ;  Surgeon: Gaynelle Arabian, MD;  Location: [facility];  Service: Orthopedics;  Laterality: Right;  . SIMPLE MASTECTOMY WITH AXILLARY SENTINEL NODE BIOPSY Left 08/30/2013   Procedure: Bilateral Breast Mastectomy ;  Surgeon: Stark Klein, MD;  Location: [facility];  Service: General;  Laterality: Left;  . skin tags removed     breast, panty line, neckline  . TOE SURGERY     preventative crossover toe surg/right foot  . TOE SURGERY     left foot/screw  in 2nd toe  . TONSILLECTOMY    . UPPER GASTROINTESTINAL ENDOSCOPY      Current Outpatient Prescriptions on File Prior to Visit  Medication Sig Dispense Refill  . Biotin 10000 MCG TBDP Take by mouth.    . Calcium Carb-Cholecalciferol (CALCIUM-VITAMIN D) 500-400 MG-UNIT TABS Take by mouth.    . cholecalciferol (VITAMIN D) 1000 units tablet Take 2,000 Units by mouth daily.    Marland Kitchen esomeprazole (NEXIUM) 40 MG capsule Take 1 capsule by mouth  daily 90 capsule 1  . levothyroxine (SYNTHROID, LEVOTHROID) 100 MCG tablet Take 1 tablet (100 mcg total) by mouth daily before breakfast. 90 tablet 1  . LUTEIN PO Take by mouth.    . meloxicam (MOBIC) 7.5 MG tablet Take 1 tablet (7.5 mg total) by mouth daily. 90 tablet 1  . methocarbamol (ROBAXIN) 500 MG tablet Take 500 mg by mouth every 6 (six) hours as needed for muscle spasms.    Marland Kitchen omega-3 acid ethyl esters (LOVAZA) 1 g capsule TAKE 1 CAPSULE TWICE DAILY 180 capsule 1  . pregabalin (LYRICA) 75 MG capsule Take 1 capsule (75 mg total) by mouth 2 (two) times daily. 180 capsule 1  . simvastatin (ZOCOR) 10 MG tablet Take 1 tablet (10 mg total) by mouth at bedtime. 90 tablet 1  . tamoxifen (NOLVADEX) 20 MG tablet Take 1 tablet (20 mg total) by mouth daily. 90 tablet 4  . temazepam (RESTORIL) 15 MG capsule Take 1 capsule (15 mg total) by mouth at bedtime as needed for sleep. 90 capsule 1    . traMADol (ULTRAM) 50 MG tablet Take 1-2 tablets (50-100 mg total) by mouth every 6 (six) hours as needed (mild pain). 80 tablet 1  . Wheat Dextrin (BENEFIBER PO) Take by mouth.     No current facility-administered medications on file prior to visit.     No Known Allergies  Family History  Problem Relation Age of Onset  . Stroke Mother     died age 27  . Diabetes Mother   . Breast cancer Mother 64  . Breast cancer Sister 63  . Breast cancer Paternal Aunt 99  . Diabetes Maternal Grandfather   .  Breast cancer Paternal Grandmother 35  . Breast cancer Paternal Aunt     dx in her 68s  . Cancer Maternal Grandmother     intra-abdominal cancer  . Brain cancer Maternal Uncle 8  . Brain cancer Cousin 50    maternal cousin  . Brain cancer Cousin 20    paternal cousin    Social History   Social History  . Marital status: Married    Spouse name: N/A  . Number of children: 3  . Years of education: N/A   Occupational History  . retired Radiation protection practitioner    Social History Main Topics  . Smoking status: Former Smoker    Packs/day: 0.10    Years: 2.00    Types: Cigarettes    Start date: 11/16/1959    Quit date: 11/15/1960  . Smokeless tobacco: Never Used  . Alcohol use No  . Drug use: No  . Sexual activity: Not on file     Comment: menarche age 39, fist live birth 23, P 3, hysterectomy age 80, no HRT, BCP 2 yrs   Other Topics Concern  . Not on file   Social History Narrative   Occupation:  Retired Radiation protection practitioner    Married with 3 grown children      Never Smoked     Alcohol use-no         Review of Systems  Constitutional: Negative.   HENT: Negative.   Eyes: Negative.   Respiratory: Negative.   Cardiovascular: Negative.   Gastrointestinal: Negative.   Genitourinary: Negative.   Musculoskeletal: Positive for joint pain (right knee- chronic ).  Skin: Negative.   Neurological: Negative.   Endo/Heme/Allergies: Negative.        Thinning hair   Psychiatric/Behavioral:  Negative.   All other systems reviewed and are negative.   BP 132/80   Temp 98.6 F (37 C) (Oral)   Ht 5' (1.524 m)   Wt 196 lb 3.2 oz (89 kg)   BMI 38.32 kg/m   Physical Exam  Constitutional: She is oriented to person, place, and time and well-developed, well-nourished, and in no distress. No distress.  obese  HENT:  Head: Normocephalic and atraumatic.  Right Ear: External ear normal.  Left Ear: External ear normal.  Nose: Nose normal.  Mouth/Throat: Oropharynx is clear and moist. No oropharyngeal exudate.  Eyes: Conjunctivae and EOM are normal. Pupils are equal, round, and reactive to light. Right eye exhibits no discharge. Left eye exhibits no discharge.  Cardiovascular: Normal rate, regular rhythm, normal heart sounds and intact distal pulses.  Exam reveals no gallop and no friction rub.   No murmur heard. Pulmonary/Chest: Effort normal and breath sounds normal. No respiratory distress. She has no wheezes. She has no rales. She exhibits no tenderness.  Abdominal: Soft. Bowel sounds are normal. She exhibits no distension and no mass. There is no tenderness. There is no rebound and no guarding.  Musculoskeletal: Normal range of motion. She exhibits no edema, tenderness or deformity.  Neurological: She is alert and oriented to person, place, and time. She has normal reflexes. She displays normal reflexes. No cranial nerve deficit. She exhibits normal muscle tone. Gait normal. Coordination normal. GCS score is 15.  Skin: Skin is warm and dry. No rash noted. She is not diaphoretic. No erythema. No pallor.  Psychiatric: Mood, memory, affect and judgment normal.  Nursing note and vitals reviewed.   Recent Results (from the past 2160 hour(s))  TSH     Status: None   Collection  Time: 08/16/16  8:50 AM  Result Value Ref Range   TSH 3.29 0.35 - 4.50 uIU/mL  Hemoglobin A1c     Status: None   Collection Time: 08/16/16  8:50 AM  Result Value Ref Range   Hgb A1c MFr Bld 6.3 4.6 - 6.5 %     Comment: Glycemic Control Guidelines for People with Diabetes:Non Diabetic:  <6%Goal of Therapy: <7%Additional Action Suggested:  >1%   Basic metabolic panel     Status: Abnormal   Collection Time: 08/16/16  8:50 AM  Result Value Ref Range   Sodium 138 135 - 145 mEq/L   Potassium 4.2 3.5 - 5.1 mEq/L   Chloride 106 96 - 112 mEq/L   CO2 26 19 - 32 mEq/L   Glucose, Bld 107 (H) 70 - 99 mg/dL   BUN 20 6 - 23 mg/dL   Creatinine, Ser 0.72 0.40 - 1.20 mg/dL   Calcium 8.4 8.4 - 10.5 mg/dL   GFR 84.31 >60.00 mL/min  CBC with Differential     Status: None   Collection Time: 08/30/16 10:56 AM  Result Value Ref Range   WBC 8.1 3.9 - 10.3 10e3/uL   NEUT# 5.3 1.5 - 6.5 10e3/uL   HGB 13.6 11.6 - 15.9 g/dL   HCT 41.1 34.8 - 46.6 %   Platelets 196 145 - 400 10e3/uL   MCV 89.2 79.5 - 101.0 fL   MCH 29.6 25.1 - 34.0 pg   MCHC 33.2 31.5 - 36.0 g/dL   RBC 4.60 3.70 - 5.45 10e6/uL   RDW 14.2 11.2 - 14.5 %   lymph# 1.9 0.9 - 3.3 10e3/uL   MONO# 0.7 0.1 - 0.9 10e3/uL   Eosinophils Absolute 0.2 0.0 - 0.5 10e3/uL   Basophils Absolute 0.0 0.0 - 0.1 10e3/uL   NEUT% 64.9 38.4 - 76.8 %   LYMPH% 23.5 14.0 - 49.7 %   MONO% 8.1 0.0 - 14.0 %   EOS% 2.9 0.0 - 7.0 %   BASO% 0.6 0.0 - 2.0 %  Comprehensive metabolic panel     Status: Abnormal   Collection Time: 08/30/16 10:56 AM  Result Value Ref Range   Sodium 139 136 - 145 mEq/L   Potassium 4.2 3.5 - 5.1 mEq/L   Chloride 104 98 - 109 mEq/L   CO2 24 22 - 29 mEq/L   Glucose 112 70 - 140 mg/dl    Comment: Glucose reference range is for nonfasting patients. Fasting glucose reference range is 70- 100.   BUN 20.2 7.0 - 26.0 mg/dL   Creatinine 0.7 0.6 - 1.1 mg/dL   Total Bilirubin 0.42 0.20 - 1.20 mg/dL   Alkaline Phosphatase 64 40 - 150 U/L   AST 22 5 - 34 U/L   ALT 32 0 - 55 U/L   Total Protein 6.4 6.4 - 8.3 g/dL   Albumin 3.3 (L) 3.5 - 5.0 g/dL   Calcium 9.0 8.4 - 10.4 mg/dL   Anion Gap 10 3 - 11 mEq/L   EGFR 81 (L) >90 ml/min/1.73 m2    Comment:  eGFR is calculated using the CKD-EPI Creatinine Equation (2009)    Assessment/Plan: 1. Encounter to establish care - She is a 75 year old medially complex patient. She has a lot of specialists who she sees on a regular basis.  - She was counseled regarding diet, exercise, and regular health maintenance issues such as colonoscopies, dental and vision exams - Reviewed previous labs and imaging  2. Gastroesophageal reflux disease without esophagitis - Controlled -  No change  3. Hypothyroidism, unspecified type - Reviewed prior labs.  - She is controlled on current dose of synthroid. Will continue to monitor - No change in medication at this tim e  4. Hyperlipidemia, unspecified hyperlipidemia type - Continue with current agents - Educated on the importance of diet and exercise  - Will check at physical   5. Prediabetes - Educated on the importance of lifestyle modification. I would recommended starting an agent such as metformin if her lifestyle does not change. She declined nutrition referral as she did when she saw the previous NP>    [name], NP

## 2016-09-10 ENCOUNTER — Telehealth: Payer: Self-pay | Admitting: *Deleted

## 2016-09-10 NOTE — Telephone Encounter (Signed)
[facility] would like to see if patient could get a 1 week supply of tamoxifen sent to the pharmacy. Tamoxifen was sent to an specialty pharmacy and will be delivered within the next week.  (P) [phone] (F) [phone]

## 2016-09-10 NOTE — Telephone Encounter (Signed)
Called [facility] to authorize 1 week of tamoxifen as requested so pt has while waiting for delivery of mail order prescription.

## 2016-09-15 ENCOUNTER — Encounter: Payer: Self-pay | Admitting: Physical Therapy

## 2016-09-15 ENCOUNTER — Ambulatory Visit: Payer: Medicare HMO | Attending: Oncology | Admitting: Physical Therapy

## 2016-09-15 DIAGNOSIS — R293 Abnormal posture: Secondary | ICD-10-CM | POA: Diagnosis not present

## 2016-09-15 DIAGNOSIS — I972 Postmastectomy lymphedema syndrome: Secondary | ICD-10-CM | POA: Insufficient documentation

## 2016-09-15 NOTE — Therapy (Signed)
[place], [place], [address] Phone: [phone]   Fax:  [phone]  Physical Therapy Evaluation  Patient Details  Name: Jean Nash MRN: [id number] Date of Birth: 02-Mar-1941 Referring Provider: Magrinat  Encounter Date: 09/15/2016      PT End of Session - 09/15/16 1704    Visit Number 1   Number of Visits 9   Date for PT Re-Evaluation 10/13/16   PT Start Time 1430   PT Stop Time 1515   PT Time Calculation (min) 45 min   Activity Tolerance Patient tolerated treatment well   Behavior During Therapy [facility] for tasks assessed/performed      Past Medical History:  Diagnosis Date  . Anxiety    PHOBIAS  . Arthritis   . Asthma    hx of -no inhalers, no problems  . Breast cancer ([place]) 08/08/13   right LOQ  . Cataract   . Chronic insomnia   . Cluster headaches    history of migraines / NONE FOR SEVERAL YRS  . Depression   . Fibromyalgia   . GERD (gastroesophageal reflux disease)   . H/O hiatal hernia   . History of colonic polyps   . History of transfusion   . Hx of radiation therapy 10/29/13- 12/14/13   right chest wall 5040 cGy 28 sessions, right supraclavicular/axillary region 5040 cGy 28 sessions, right chest wall boost 1000 cGy 5 sessions  . Hypothyroidism   . Irritable bowel syndrome   . Kidney stone   . Lymphedema    RT ARM - WEARS SLEEVE  . Macular degeneration   . Osteopenia   . Other abnormal glucose   . Other and unspecified hyperlipidemia   . Pain in joint, shoulder region   . Pneumonia [id number]  . Sleep apnea    USES C-PAP  . Stress incontinence, female     Past Surgical History:  Procedure Laterality Date  . ABDOMINAL HYSTERECTOMY    . APPENDECTOMY    . BILATERAL TOTAL MASTECTOMY WITH AXILLARY LYMPH NODE DISSECTION  08/30/2013   Dr Barry Dienes  . BREAST CYST ASPIRATION     9 cysts  . CATARACT EXTRACTION, BILATERAL    . CHOLECYSTECTOMY    . COLONOSCOPY    .  EVACUATION BREAST HEMATOMA Left 08/31/2013   Procedure: EVACUATION HEMATOMA BREAST;  Surgeon: Stark Klein, MD;  Location: [place];  Service: General;  Laterality: Left;  . EYE SURGERY     to repair macular hole  . FOOT ARTHROPLASTY     lt   . GANGLION CYST EXCISION     rt foot  . HEMORRHOID SURGERY     03/1993  . JOINT REPLACEMENT  03/15/11   left knee replacement  . KNEE ARTHROSCOPY     > 10 years ago  . MASS EXCISION  11/04/2011   Procedure: EXCISION MASS;  Surgeon: Cammie Sickle., MD;  Location: [place];  Service: Orthopedics;  Laterality: Right;  excisional biopsy right ulna mass  . MASTECTOMY W/ SENTINEL NODE BIOPSY Right 08/30/2013   Procedure: RIGHT  AXILLARY SENTINEL LYMPH NODE BIOPSY; Right Axillary Node Disection;  Surgeon: Stark Klein, MD;  Location: [place];  Service: General;  Laterality: Right;  Right side nuc med 7:00   . PARTIAL KNEE ARTHROPLASTY Right 11/03/2015   Procedure: RIGHT KNEE MEDIAL UNICOMPARTMENTAL ARTHROPLASTY ;  Surgeon: Gaynelle Arabian, MD;  [facility];  Service: Orthopedics;  Laterality: Right;  . SIMPLE MASTECTOMY WITH AXILLARY SENTINEL NODE  BIOPSY Left 08/30/2013   Procedure: Bilateral Breast Mastectomy ;  Surgeon: Stark Klein, MD;  Location: [place];  Service: General;  Laterality: Left;  . skin tags removed     breast, panty line, neckline  . TOE SURGERY     preventative crossover toe surg/right foot  . TOE SURGERY     left foot/screw  in 2nd toe  . TONSILLECTOMY    . UPPER GASTROINTESTINAL ENDOSCOPY      There were no vitals filed for this visit.       Subjective Assessment - 09/15/16 1432    Subjective I have this swelling in front of both axilla and the right is worse than the left. I had breast cancer on the right. They kind of taught me how to do the massage but I don't really know how. I haven't been doing any exercises. I stopped wearing my sleeve because my swelling went down. In the summer I can not wear the  sleeve at all because I am hot all the time. There is no way I can wear it in the summer the winter time is hard enough.    Pertinent History Right arm swelling began around end of March 2016 for unknown reasons.  She had a bilateral mastectomy with a right axillary node dissection removing 16 lymph nodes and 9 of those were positive.  Currently taking tamoxifen. , tendonitis right knee   Patient Stated Goals get rid of swelling   Currently in Pain? No/denies   Pain Score 0-No pain            [facility] PT Assessment - 09/15/16 0001      Assessment   Medical Diagnosis right breast cancer   Referring Provider Magrinat   Onset Date/Surgical Date 08/30/13   Hand Dominance Right   Prior Therapy lymphedema services at this clinic Dec 2016, PT for R knee early in 2017     Precautions   Precautions Other (comment)  cancer, lymphedema     Restrictions   Weight Bearing Restrictions No     Balance Screen   Has the patient fallen in the past 6 months No   Has the patient had a decrease in activity level because of a fear of falling?  No   Is the patient reluctant to leave their home because of a fear of falling?  No     Home Social worker Private residence   Living Arrangements Spouse/significant other   Available Help at Discharge Family   Type of [place] One level   [place] - 2 wheels;Walker - 4 wheels;Cane - single point;Crutches;Grab bars - toilet;Wheelchair - manual     Prior Function   Level of Independence Independent   Vocation Retired   Leisure pt reports she does not exercise     Cognition   Overall Cognitive Status Within Functional Limits for tasks assessed     Observation/Other Assessments   Other Surveys  --  LLIS: 38% impairment     Posture/Postural Control   Posture Comments rounded shoulders     AROM   Overall AROM  Within functional limits for tasks performed            LYMPHEDEMA/ONCOLOGY QUESTIONNAIRE - 09/15/16 1447      Type   Cancer Type right breast cancer     Surgeries   Mastectomy Date 08/30/13   Axillary Lymph Node Dissection Date 08/30/13  Number Lymph Nodes Removed 16  9 were positive     Date Lymphedema/Swelling Started   Date 08/30/13     Treatment   Active Chemotherapy Treatment No   Past Chemotherapy Treatment No   Active Radiation Treatment No   Past Radiation Treatment Yes   Date 12/14/13   Body Site right chest and axilla   Current Hormone Treatment Yes   Date 04/16/15   Drug Name Tamoxifen   Past Hormone Therapy No     What other symptoms do you have   Are you Having Heaviness or Tightness Yes   Are you having Pain Yes   Are you having pitting edema No   Is it Hard or Difficult finding clothes that fit No   Do you have infections No   Is there Decreased scar mobility No     Lymphedema Assessments   Lymphedema Assessments Upper extremities     Right Upper Extremity Lymphedema   15 cm Proximal to Olecranon Process 39 cm   10 cm Proximal to Olecranon Process 35 cm   Olecranon Process 25.5 cm   10 cm Proximal to Ulnar Styloid Process 22.4 cm   Just Proximal to Ulnar Styloid Process 16.6 cm   Across Hand at [employer] 17.2 cm   At [place] of 2nd Digit 5.9 cm                [facility] Adult PT Treatment/Exercise - 09/15/16 0001      Manual Therapy   Manual Lymphatic Drainage (MLD) In supine: Short neck, superficial and deep abdominals, left axilla and anterior interaxillary anastomosis, right groin and axillo-inguinal anastomosis, and right upper quadrant where there is increased edema moving fluid towards pathways then retracing steps                        Long Term Clinic Goals - 09/15/16 1712      CC Long Term Goal  #1   Title Patient will report at least 50% improvement in swelling in right upper quadrant   Time 4   Period Weeks   Status New     CC Long Term Goal  #2   Title  Patient will reports a 50% improvement in chest pain in area of mastectomy scar   Time 4   Period Weeks   Status New     CC Long Term Goal  #3   Title Patient and spouse will be independent in self manual lymphatic drainage technique for long term management of edema   Time 4   Period Weeks   Status New            Plan - 09/15/16 1705    Clinical Impression Statement Patient presents to PT clinic for increased swelling in right anterior axilla following bilateral mastectomy and radiation for treatment of right breast cancer. She has been seen by this clinic prior and was last seen a year ago. She had been seen for RUE lymphedema which she reports is under control. She has not been wearing her sleeve but her circumferential measurements have not increased over the last year. At this time she is only having swelling in the R upper quadrant. She also reports pain near her right mastectomy scar. The scar has good mobility but she does demonstrate pec tightness and fibrosis which could be contributing to the pain. Her bilateral shoulder ROM is within functional limitis bilaterally.    Rehab Potential Good  Clinical Impairments Affecting Rehab Potential hx of radiation, chronic pain   PT Frequency 2x / week   PT Duration 4 weeks   PT Treatment/Interventions Manual lymph drainage;Manual techniques;Compression bandaging;Scar mobilization;Taping;Vasopneumatic Device;Therapeutic exercise;ADLs/Self Care Home Management;Patient/family education   PT Next Visit Plan MLD to R upper quadrant, corner stretch for tightness   Consulted and Agree with Plan of Care Patient;Family member/caregiver      Patient will benefit from skilled therapeutic intervention in order to improve the following deficits and impairments:  Increased edema, Increased fascial restricitons  Visit Diagnosis: Postmastectomy lymphedema - Plan: PT plan of care cert/re-cert  Abnormal posture - Plan: PT plan of care  cert/re-cert      G-Codes - 0000000 1713    Functional Assessment Tool Used LLIS   Functional Limitation Other PT primary   Other PT Primary Current Status [id number]) At least 20 percent but less than 40 percent impaired, limited or restricted   Other PT Primary Goal Status [id number]) At least 1 percent but less than 20 percent impaired, limited or restricted       Problem List Patient Active Problem List   Diagnosis Date Noted  . OA (osteoarthritis) of knee 11/03/2015  . Osteopenia 08/08/2015  . Chronic insomnia 08/08/2015  . Obstructive sleep apnea 06/25/2015  . Headache 02/03/2015  . Atypical chest pain 07/15/2014  . Arthralgia 02/22/2014  . Psoriasis 02/22/2014  . Breast cancer of lower-outer quadrant of right female breast ([place]) 08/09/2013  . Neck pain 06/22/2013  . Left knee pain 11/29/2012  . Gastroenteritis 04/26/2012  . Reactive depression (situational) 04/26/2012  . Right wrist pain 02/02/2012  . Right foot pain 10/04/2011  . Nevus 06/05/2011  . Status post total knee replacement 04/06/2011  . Overactive bladder 03/14/2011  . KNEE PAIN, LEFT 08/31/2010  . HEARING LOSS 08/17/2010  . HIRSUTISM 06/16/2009  . CYST, IDIOPATHIC 05/20/2008  . IRRITABLE BOWEL SYNDROME 04/15/2008  . Asthma 03/19/2008  . GERD 03/19/2008  . COLONIC POLYPS, HX OF 03/19/2008  . NEPHROLITHIASIS, HX OF 03/19/2008  . Hypothyroidism 03/18/2008  . Hyperlipidemia 03/18/2008  . INSOMNIA, CHRONIC 03/18/2008  . FIBROMYALGIA 03/18/2008  . Prediabetes 03/18/2008    Alexia Freestone 09/15/2016, 5:18 PM  [place], [place], [address] Phone: [phone]   Fax:  [phone]  Name: Jean Nash MRN: [id number] Date of Birth: 07/22/1941  Allyson Sabal, PT 09/15/16 5:18 PM

## 2016-09-29 ENCOUNTER — Ambulatory Visit: Payer: Medicare HMO

## 2016-09-29 DIAGNOSIS — I972 Postmastectomy lymphedema syndrome: Secondary | ICD-10-CM | POA: Diagnosis not present

## 2016-09-29 NOTE — Therapy (Signed)
[place], [place], [address] Phone: [phone]   Fax:  [phone]  Physical Therapy Treatment  Patient Details  Name: Jean Nash MRN: [id number] Date of Birth: 1940/12/02 Referring Provider: Magrinat  Encounter Date: 09/29/2016      PT End of Session - 09/29/16 1659    Visit Number 2   Number of Visits 9   Date for PT Re-Evaluation 10/13/16   PT Start Time 1351   PT Stop Time 1433   PT Time Calculation (min) 42 min   Activity Tolerance Patient tolerated treatment well   Behavior During Therapy [facility] for tasks assessed/performed      Past Medical History:  Diagnosis Date  . Anxiety    PHOBIAS  . Arthritis   . Asthma    hx of -no inhalers, no problems  . Breast cancer ([place]) 08/08/13   right LOQ  . Cataract   . Chronic insomnia   . Cluster headaches    history of migraines / NONE FOR SEVERAL YRS  . Depression   . Fibromyalgia   . GERD (gastroesophageal reflux disease)   . H/O hiatal hernia   . History of colonic polyps   . History of transfusion   . Hx of radiation therapy 10/29/13- 12/14/13   right chest wall 5040 cGy 28 sessions, right supraclavicular/axillary region 5040 cGy 28 sessions, right chest wall boost 1000 cGy 5 sessions  . Hypothyroidism   . Irritable bowel syndrome   . Kidney stone   . Lymphedema    RT ARM - WEARS SLEEVE  . Macular degeneration   . Osteopenia   . Other abnormal glucose   . Other and unspecified hyperlipidemia   . Pain in joint, shoulder region   . Pneumonia [id number]  . Sleep apnea    USES C-PAP  . Stress incontinence, female     Past Surgical History:  Procedure Laterality Date  . ABDOMINAL HYSTERECTOMY    . APPENDECTOMY    . BILATERAL TOTAL MASTECTOMY WITH AXILLARY LYMPH NODE DISSECTION  08/30/2013   Dr Barry Dienes  . BREAST CYST ASPIRATION     9 cysts  . CATARACT EXTRACTION, BILATERAL    . CHOLECYSTECTOMY    . COLONOSCOPY    .  EVACUATION BREAST HEMATOMA Left 08/31/2013   Procedure: EVACUATION HEMATOMA BREAST;  Surgeon: Stark Klein, MD;  Location: [place];  Service: General;  Laterality: Left;  . EYE SURGERY     to repair macular hole  . FOOT ARTHROPLASTY     lt   . GANGLION CYST EXCISION     rt foot  . HEMORRHOID SURGERY     03/1993  . JOINT REPLACEMENT  03/15/11   left knee replacement  . KNEE ARTHROSCOPY     > 10 years ago  . MASS EXCISION  11/04/2011   Procedure: EXCISION MASS;  Surgeon: Cammie Sickle., MD;  Location: [place];  Service: Orthopedics;  Laterality: Right;  excisional biopsy right ulna mass  . MASTECTOMY W/ SENTINEL NODE BIOPSY Right 08/30/2013   Procedure: RIGHT  AXILLARY SENTINEL LYMPH NODE BIOPSY; Right Axillary Node Disection;  Surgeon: Stark Klein, MD;  Location: [place];  Service: General;  Laterality: Right;  Right side nuc med 7:00   . PARTIAL KNEE ARTHROPLASTY Right 11/03/2015   Procedure: RIGHT KNEE MEDIAL UNICOMPARTMENTAL ARTHROPLASTY ;  Surgeon: Gaynelle Arabian, MD;  [facility];  Service: Orthopedics;  Laterality: Right;  . SIMPLE MASTECTOMY WITH AXILLARY SENTINEL NODE  BIOPSY Left 08/30/2013   Procedure: Bilateral Breast Mastectomy ;  Surgeon: Stark Klein, MD;  Location: [place];  Service: General;  Laterality: Left;  . skin tags removed     breast, panty line, neckline  . TOE SURGERY     preventative crossover toe surg/right foot  . TOE SURGERY     left foot/screw  in 2nd toe  . TONSILLECTOMY    . UPPER GASTROINTESTINAL ENDOSCOPY      There were no vitals filed for this visit.      Subjective Assessment - 09/29/16 1403    Subjective I'm willing to learn the manual lymph drainage because my husband won't be doing it that often for me. He just gets tired by the end of the day. I still have the shooting pains that come and go but I'm not in any pain right now.    Pertinent History Right arm swelling began around end of March 2016 for unknown reasons.   She had a bilateral mastectomy with a right axillary node dissection removing 16 lymph nodes and 9 of those were positive.  Currently taking tamoxifen. , tendonitis right knee   Patient Stated Goals get rid of swelling   Currently in Pain? No/denies                         [facility] Adult PT Treatment/Exercise - 09/29/16 0001      Self-Care   Self-Care Other Self-Care Comments   Other Self-Care Comments  Discussed with pt what she hoped to gain from this episode of care that she hadn't already gained from previous episodes and she responded with wanting her husband to learn the MLD. When asked, he continues to report he won't be willing to do it often. At that point she was willing to learn herself so began instruction of this today while performing. Also educated pt on importance of trying to get a proper fitting compression garment for her trunk/axilla at possibly [employer] or [place].      Manual Therapy   Manual Lymphatic Drainage (MLD) In supine: Short neck, 5 diaphragmatic breaths, left axilla and anterior interaxillary anastomosis, right groin and axillo-inguinal anastomosis, and right upper quadrant where there is increased edema moving fluid towards pathways then retracing steps instructing pt throughout and having her practice hand techniques at each sequence.                PT Education - 09/29/16 1436    Education provided Yes   Education Details Self Manual lymph drainage   Person(s) Educated Patient   Methods Explanation;Demonstration;Handout   Comprehension Verbalized understanding;Returned demonstration;Need further instruction                [facility] Goals - 09/29/16 1706      CC Long Term Goal  #1   Title Patient will report at least 50% improvement in swelling in right upper quadrant   Status On-going     CC Long Term Goal  #2   Title Patient will reports a 50% improvement in chest pain in area of mastectomy scar    Status On-going     CC Long Term Goal  #3   Title Patient and spouse will be independent in self manual lymphatic drainage technique for long term management of edema   Status On-going            Plan - 09/29/16 1700    Clinical Impression  Statement Pt was engaged this session with learning self manual lymph drainage after therapist had discussed with pt what she hoped to gain from this session that she hadn't from previous episodes. She was hoping for her husband to be re-instructed in the manual lymph drainage but after he continued to report he wouldn't do it often pt was willing to learn and did very well with initial session of this. Also encouraged pt to go to [employer] or [employer] [facility] to see if they have any options for a compression type shirt or bra that would possibly better fit her to encompass her axilla swelling.   Rehab Potential Good   Clinical Impairments Affecting Rehab Potential hx of radiation, chronic pain   PT Frequency 2x / week   PT Duration 4 weeks   PT Treatment/Interventions Manual lymph drainage;Manual techniques;Compression bandaging;Scar mobilization;Taping;Vasopneumatic Device;Therapeutic exercise;ADLs/Self Care Home Management;Patient/family education   PT Next Visit Plan Cont instruction of MLD to R upper quadrant and add corner stretch for tightness   PT Home Exercise Plan Self manual lymph drainage   Consulted and Agree with Plan of Care Patient      Patient will benefit from skilled therapeutic intervention in order to improve the following deficits and impairments:  Increased edema, Increased fascial restricitons  Visit Diagnosis: Postmastectomy lymphedema     Problem List Patient Active Problem List   Diagnosis Date Noted  . OA (osteoarthritis) of knee 11/03/2015  . Osteopenia 08/08/2015  . Chronic insomnia 08/08/2015  . Obstructive sleep apnea 06/25/2015  . Headache 02/03/2015  . Atypical chest pain 07/15/2014  . Arthralgia  02/22/2014  . Psoriasis 02/22/2014  . Breast cancer of lower-outer quadrant of right female breast ([place]) 08/09/2013  . Neck pain 06/22/2013  . Left knee pain 11/29/2012  . Gastroenteritis 04/26/2012  . Reactive depression (situational) 04/26/2012  . Right wrist pain 02/02/2012  . Right foot pain 10/04/2011  . Nevus 06/05/2011  . Status post total knee replacement 04/06/2011  . Overactive bladder 03/14/2011  . KNEE PAIN, LEFT 08/31/2010  . HEARING LOSS 08/17/2010  . HIRSUTISM 06/16/2009  . CYST, IDIOPATHIC 05/20/2008  . IRRITABLE BOWEL SYNDROME 04/15/2008  . Asthma 03/19/2008  . GERD 03/19/2008  . COLONIC POLYPS, HX OF 03/19/2008  . NEPHROLITHIASIS, HX OF 03/19/2008  . Hypothyroidism 03/18/2008  . Hyperlipidemia 03/18/2008  . INSOMNIA, CHRONIC 03/18/2008  . FIBROMYALGIA 03/18/2008  . Prediabetes 03/18/2008    Otelia Limes, PTA 09/29/2016, 5:12 PM  [place], [place], [address] Phone: [phone]   Fax:  [phone]  Name: Juwanda Leggio MRN: [id number] Date of Birth: [birth year]

## 2016-09-29 NOTE — Patient Instructions (Signed)
Cancer Rehab [phone] Start with circles near neck 5-10 times (with hands above collar bones, can do with one hand at a time) Deep Effective Breath   Standing, sitting, or laying down, place both hands on the belly. Take a deep breath IN, expanding the belly; then breath OUT, contracting the belly. Repeat __5__ times. Do __2-3__ sessions per day and before your self massage. Marland Kitchen  Axilla to Axilla - Sweep   On Left side make 5 circles in the armpit, then pump _5__ times from involved armpit across chest to uninvolved armpit, making a pathway. Do _1__ time per day.  Copyright  VHI. All rights reserved.  Axilla to Inguinal Nodes - Sweep   On Right side, make 5 circles at groin at panty line, then pump _5__ times from armpit along side of trunk to outer hip, making your other pathway. Do __1_ time per day.  Copyright  VHI. All rights reserved.  Arm Posterior: Elbow to Shoulder - Sweep   Pump _5__ times from back of elbow to top of shoulder. Then inner to outer upper arm _5_ times, then outer arm again _5_ times. Then back to the pathways _2-3_ times. Do _1__ time per day.  Copyright  VHI. All rights reserved.  ARM: [place] Wrist to Elbow - Sweep   Pump or stationary circles _5__ times from wrist to elbow making sure to do both sides of the forearm. Then retrace your steps to the outer arm, and the pathways _2-3_ times each. Do _1__ time per day.  Copyright  VHI. All rights reserved.  ARM: Dorsum of Hand to Shoulder - Sweep   Pump or stationary circles _5__ times on back of hand including knuckle spaces and individual fingers if needed working up towards the wrist, then retrace all your steps working back up the forearm, doing both sides; upper outer arm and back to your pathways _2-3_ times each. Then do 5 circles again at uninvolved armpit and involved groin where you started! Good job!! Do __1_ time per day.  Copyright  VHI. All rights reserved.

## 2016-10-04 ENCOUNTER — Ambulatory Visit: Payer: Medicare HMO | Admitting: Physical Therapy

## 2016-10-04 DIAGNOSIS — I972 Postmastectomy lymphedema syndrome: Secondary | ICD-10-CM

## 2016-10-04 NOTE — Therapy (Signed)
[place], [place], [address] Phone: [phone]   Fax:  [phone]  Physical Therapy Treatment  Patient Details  Name: Jean Nash MRN: [id number] Date of Birth: 1941-08-09 Referring Provider: Magrinat  Encounter Date: 10/04/2016      PT End of Session - 10/04/16 1655    Visit Number 3   Number of Visits 9   Date for PT Re-Evaluation 10/13/16   PT Start Time 1518   PT Stop Time 1556   PT Time Calculation (min) 38 min   Activity Tolerance Patient tolerated treatment well   Behavior During Therapy [facility] for tasks assessed/performed      Past Medical History:  Diagnosis Date  . Anxiety    PHOBIAS  . Arthritis   . Asthma    hx of -no inhalers, no problems  . Breast cancer ([place]) 08/08/13   right LOQ  . Cataract   . Chronic insomnia   . Cluster headaches    history of migraines / NONE FOR SEVERAL YRS  . Depression   . Fibromyalgia   . GERD (gastroesophageal reflux disease)   . H/O hiatal hernia   . History of colonic polyps   . History of transfusion   . Hx of radiation therapy 10/29/13- 12/14/13   right chest wall 5040 cGy 28 sessions, right supraclavicular/axillary region 5040 cGy 28 sessions, right chest wall boost 1000 cGy 5 sessions  . Hypothyroidism   . Irritable bowel syndrome   . Kidney stone   . Lymphedema    RT ARM - WEARS SLEEVE  . Macular degeneration   . Osteopenia   . Other abnormal glucose   . Other and unspecified hyperlipidemia   . Pain in joint, shoulder region   . Pneumonia [id number]  . Sleep apnea    USES C-PAP  . Stress incontinence, female     Past Surgical History:  Procedure Laterality Date  . ABDOMINAL HYSTERECTOMY    . APPENDECTOMY    . BILATERAL TOTAL MASTECTOMY WITH AXILLARY LYMPH NODE DISSECTION  08/30/2013   Dr Barry Dienes  . BREAST CYST ASPIRATION     9 cysts  . CATARACT EXTRACTION, BILATERAL    . CHOLECYSTECTOMY    . COLONOSCOPY    .  EVACUATION BREAST HEMATOMA Left 08/31/2013   Procedure: EVACUATION HEMATOMA BREAST;  Surgeon: Stark Klein, MD;  Location: [place];  Service: General;  Laterality: Left;  . EYE SURGERY     to repair macular hole  . FOOT ARTHROPLASTY     lt   . GANGLION CYST EXCISION     rt foot  . HEMORRHOID SURGERY     03/1993  . JOINT REPLACEMENT  03/15/11   left knee replacement  . KNEE ARTHROSCOPY     > 10 years ago  . MASS EXCISION  11/04/2011   Procedure: EXCISION MASS;  Surgeon: Cammie Sickle., MD;  Location: [place];  Service: Orthopedics;  Laterality: Right;  excisional biopsy right ulna mass  . MASTECTOMY W/ SENTINEL NODE BIOPSY Right 08/30/2013   Procedure: RIGHT  AXILLARY SENTINEL LYMPH NODE BIOPSY; Right Axillary Node Disection;  Surgeon: Stark Klein, MD;  Location: [place];  Service: General;  Laterality: Right;  Right side nuc med 7:00   . PARTIAL KNEE ARTHROPLASTY Right 11/03/2015   Procedure: RIGHT KNEE MEDIAL UNICOMPARTMENTAL ARTHROPLASTY ;  Surgeon: Gaynelle Arabian, MD;  [facility];  Service: Orthopedics;  Laterality: Right;  . SIMPLE MASTECTOMY WITH AXILLARY SENTINEL NODE  BIOPSY Left 08/30/2013   Procedure: Bilateral Breast Mastectomy ;  Surgeon: Stark Klein, MD;  Location: [place];  Service: General;  Laterality: Left;  . skin tags removed     breast, panty line, neckline  . TOE SURGERY     preventative crossover toe surg/right foot  . TOE SURGERY     left foot/screw  in 2nd toe  . TONSILLECTOMY    . UPPER GASTROINTESTINAL ENDOSCOPY      There were no vitals filed for this visit.      Subjective Assessment - 10/04/16 1519    Subjective Nothing new to report.  "I did do it (the manual lymph drainage) and I noticed I'm peeing more."   Currently in Pain? No/denies                         [facility] Adult PT Treatment/Exercise - 10/04/16 0001      Manual Therapy   Manual Lymphatic Drainage (MLD) Verbal instruction as well as tactile cueing  to patient as she performed the following:  short neck, diaphragmatic breathing, left axilla and anterior interaxillary anastomosis, right groin and axillo-inguinal anastomosis, and right UE from dorsal hand to shoulder.                PT Education - 10/04/16 1655    Education provided Yes   Education Details self manual lymph drainage   Person(s) Educated Patient   Methods Explanation;Tactile cues;Verbal cues   Comprehension Verbalized understanding;Returned demonstration;Need further instruction                [facility] Goals - 09/29/16 1706      CC Long Term Goal  #1   Title Patient will report at least 50% improvement in swelling in right upper quadrant   Status On-going     CC Long Term Goal  #2   Title Patient will reports a 50% improvement in chest pain in area of mastectomy scar   Status On-going     CC Long Term Goal  #3   Title Patient and spouse will be independent in self manual lymphatic drainage technique for long term management of edema   Status On-going            Plan - 10/04/16 1656    Clinical Impression Statement Patient needed a lot of cueing today for her self-manual lymph drainage, and repeatedly said she needed help with it; however, she did report understanding it better after her last visit where the PTA gave her good explanation.  It seems she will need some repetition of instruction.  She said it was difficult to read the handout while doing the manual lymph drainage.   Rehab Potential Good   Clinical Impairments Affecting Rehab Potential hx of radiation, chronic pain   PT Frequency 2x / week   PT Duration 4 weeks   PT Treatment/Interventions Manual lymph drainage;Manual techniques;Compression bandaging;Scar mobilization;Taping;Vasopneumatic Device;Therapeutic exercise;ADLs/Self Care Home Management;Patient/family education   PT Next Visit Plan Check goals.  Cont instruction of MLD to R upper quadrant and add corner stretch  for tightness   PT Home Exercise Plan Self manual lymph drainage      Patient will benefit from skilled therapeutic intervention in order to improve the following deficits and impairments:  Increased edema, Increased fascial restricitons  Visit Diagnosis: Postmastectomy lymphedema     Problem List Patient Active Problem List   Diagnosis Date Noted  . OA (osteoarthritis) of knee  11/03/2015  . Osteopenia 08/08/2015  . Chronic insomnia 08/08/2015  . Obstructive sleep apnea 06/25/2015  . Headache 02/03/2015  . Atypical chest pain 07/15/2014  . Arthralgia 02/22/2014  . Psoriasis 02/22/2014  . Breast cancer of lower-outer quadrant of right female breast ([place]) 08/09/2013  . Neck pain 06/22/2013  . Left knee pain 11/29/2012  . Gastroenteritis 04/26/2012  . Reactive depression (situational) 04/26/2012  . Right wrist pain 02/02/2012  . Right foot pain 10/04/2011  . Nevus 06/05/2011  . Status post total knee replacement 04/06/2011  . Overactive bladder 03/14/2011  . KNEE PAIN, LEFT 08/31/2010  . HEARING LOSS 08/17/2010  . HIRSUTISM 06/16/2009  . CYST, IDIOPATHIC 05/20/2008  . IRRITABLE BOWEL SYNDROME 04/15/2008  . Asthma 03/19/2008  . GERD 03/19/2008  . COLONIC POLYPS, HX OF 03/19/2008  . NEPHROLITHIASIS, HX OF 03/19/2008  . Hypothyroidism 03/18/2008  . Hyperlipidemia 03/18/2008  . INSOMNIA, CHRONIC 03/18/2008  . FIBROMYALGIA 03/18/2008  . Prediabetes 03/18/2008    Raini Tiley 10/04/2016, 5:01 PM  [place], [place], [address] Phone: [phone]   Fax:  [phone]  Name: Jean Nash MRN: [id number] Date of Birth: 1941/07/13   Serafina Royals, PT 10/04/16 5:02 PM

## 2016-10-11 ENCOUNTER — Ambulatory Visit: Payer: Medicare HMO | Admitting: Physical Therapy

## 2016-10-11 ENCOUNTER — Encounter: Payer: Self-pay | Admitting: Physical Therapy

## 2016-10-11 DIAGNOSIS — I972 Postmastectomy lymphedema syndrome: Secondary | ICD-10-CM | POA: Diagnosis not present

## 2016-10-11 NOTE — Therapy (Signed)
[place], [place], [address] Phone: [phone]   Fax:  [phone]  Physical Therapy Treatment  Patient Details  Name: Jean Nash MRN: [id number] Date of Birth: 1941/03/22 Referring Provider: Magrinat  Encounter Date: 10/11/2016      PT End of Session - 10/11/16 1520    Visit Number 4   Number of Visits 9   Date for PT Re-Evaluation 10/13/16   PT Start Time 1430   PT Stop Time 1515   PT Time Calculation (min) 45 min   Activity Tolerance Patient tolerated treatment well   Behavior During Therapy [facility] for tasks assessed/performed      Past Medical History:  Diagnosis Date  . Anxiety    PHOBIAS  . Arthritis   . Asthma    hx of -no inhalers, no problems  . Breast cancer ([place]) 08/08/13   right LOQ  . Cataract   . Chronic insomnia   . Cluster headaches    history of migraines / NONE FOR SEVERAL YRS  . Depression   . Fibromyalgia   . GERD (gastroesophageal reflux disease)   . H/O hiatal hernia   . History of colonic polyps   . History of transfusion   . Hx of radiation therapy 10/29/13- 12/14/13   right chest wall 5040 cGy 28 sessions, right supraclavicular/axillary region 5040 cGy 28 sessions, right chest wall boost 1000 cGy 5 sessions  . Hypothyroidism   . Irritable bowel syndrome   . Kidney stone   . Lymphedema    RT ARM - WEARS SLEEVE  . Macular degeneration   . Osteopenia   . Other abnormal glucose   . Other and unspecified hyperlipidemia   . Pain in joint, shoulder region   . Pneumonia [id number]  . Sleep apnea    USES C-PAP  . Stress incontinence, female     Past Surgical History:  Procedure Laterality Date  . ABDOMINAL HYSTERECTOMY    . APPENDECTOMY    . BILATERAL TOTAL MASTECTOMY WITH AXILLARY LYMPH NODE DISSECTION  08/30/2013   Dr Barry Dienes  . BREAST CYST ASPIRATION     9 cysts  . CATARACT EXTRACTION, BILATERAL    . CHOLECYSTECTOMY    . COLONOSCOPY    .  EVACUATION BREAST HEMATOMA Left 08/31/2013   Procedure: EVACUATION HEMATOMA BREAST;  Surgeon: Stark Klein, MD;  Location: [place];  Service: General;  Laterality: Left;  . EYE SURGERY     to repair macular hole  . FOOT ARTHROPLASTY     lt   . GANGLION CYST EXCISION     rt foot  . HEMORRHOID SURGERY     03/1993  . JOINT REPLACEMENT  03/15/11   left knee replacement  . KNEE ARTHROSCOPY     > 10 years ago  . MASS EXCISION  11/04/2011   Procedure: EXCISION MASS;  Surgeon: Cammie Sickle., MD;  Location: [place];  Service: Orthopedics;  Laterality: Right;  excisional biopsy right ulna mass  . MASTECTOMY W/ SENTINEL NODE BIOPSY Right 08/30/2013   Procedure: RIGHT  AXILLARY SENTINEL LYMPH NODE BIOPSY; Right Axillary Node Disection;  Surgeon: Stark Klein, MD;  Location: [place];  Service: General;  Laterality: Right;  Right side nuc med 7:00   . PARTIAL KNEE ARTHROPLASTY Right 11/03/2015   Procedure: RIGHT KNEE MEDIAL UNICOMPARTMENTAL ARTHROPLASTY ;  Surgeon: Gaynelle Arabian, MD;  [facility];  Service: Orthopedics;  Laterality: Right;  . SIMPLE MASTECTOMY WITH AXILLARY SENTINEL NODE  BIOPSY Left 08/30/2013   Procedure: Bilateral Breast Mastectomy ;  Surgeon: Stark Klein, MD;  Location: [place];  Service: General;  Laterality: Left;  . skin tags removed     breast, panty line, neckline  . TOE SURGERY     preventative crossover toe surg/right foot  . TOE SURGERY     left foot/screw  in 2nd toe  . TONSILLECTOMY    . UPPER GASTROINTESTINAL ENDOSCOPY      There were no vitals filed for this visit.      Subjective Assessment - 10/11/16 1435    Subjective We are working on the massage. I did it twice by myself and my husband helped me once. I can't find where I am in the instructions sometimes.    Pertinent History Right arm swelling began around end of March 2016 for unknown reasons.  She had a bilateral mastectomy with a right axillary node dissection removing 16 lymph  nodes and 9 of those were positive.  Currently taking tamoxifen. , tendonitis right knee   Patient Stated Goals get rid of swelling   Currently in Pain? No/denies   Pain Score 0-No pain   Multiple Pain Sites Yes   Pain Score 8   Pain Location Hip   Pain Orientation Right   Pain Descriptors / Indicators Constant   Pain Type Acute pain   Pain Onset Today   Pain Frequency Constant   Aggravating Factors  pt not sure   Pain Relieving Factors not sure                         [facility] Adult PT Treatment/Exercise - 10/11/16 0001      Manual Therapy   Manual Lymphatic Drainage (MLD) Verbal instruction as well as tactile cueing to patient as she performed the following:  short neck, diaphragmatic breathing, left axilla and anterior interaxillary anastomosis, right groin and axillo-inguinal anastomosis, and right UE from dorsal hand to shoulder.                        [facility] Goals - 09/29/16 1706      CC Long Term Goal  #1   Title Patient will report at least 50% improvement in swelling in right upper quadrant   Status On-going     CC Long Term Goal  #2   Title Patient will reports a 50% improvement in chest pain in area of mastectomy scar   Status On-going     CC Long Term Goal  #3   Title Patient and spouse will be independent in self manual lymphatic drainage technique for long term management of edema   Status On-going            Plan - 10/11/16 1520    Clinical Impression Statement Patient still needed frequent cueing today for self manual lymphatic drainage. She required cueing for speed, hand placement and correct direction of stretch. Patient has a tendency to squeeze with her left hand instead of using a flat hand stretching the skin in one direction. Towards the end of the session pt required less cueing for correct hand placement.    Rehab Potential Good   Clinical Impairments Affecting Rehab Potential hx of radiation, chronic pain    PT Frequency 2x / week   PT Duration 4 weeks   PT Treatment/Interventions Manual lymph drainage;Manual techniques;Compression bandaging;Scar mobilization;Taping;Vasopneumatic Device;Therapeutic exercise;ADLs/Self Care Home Management;Patient/family education   PT Next Visit  Plan Check goals.  Cont instruction of MLD to R upper quadrant and add corner stretch for tightness   PT Home Exercise Plan Self manual lymph drainage   Consulted and Agree with Plan of Care Patient      Patient will benefit from skilled therapeutic intervention in order to improve the following deficits and impairments:  Increased edema, Increased fascial restricitons  Visit Diagnosis: Postmastectomy lymphedema     Problem List Patient Active Problem List   Diagnosis Date Noted  . OA (osteoarthritis) of knee 11/03/2015  . Osteopenia 08/08/2015  . Chronic insomnia 08/08/2015  . Obstructive sleep apnea 06/25/2015  . Headache 02/03/2015  . Atypical chest pain 07/15/2014  . Arthralgia 02/22/2014  . Psoriasis 02/22/2014  . Breast cancer of lower-outer quadrant of right female breast ([place]) 08/09/2013  . Neck pain 06/22/2013  . Left knee pain 11/29/2012  . Gastroenteritis 04/26/2012  . Reactive depression (situational) 04/26/2012  . Right wrist pain 02/02/2012  . Right foot pain 10/04/2011  . Nevus 06/05/2011  . Status post total knee replacement 04/06/2011  . Overactive bladder 03/14/2011  . KNEE PAIN, LEFT 08/31/2010  . HEARING LOSS 08/17/2010  . HIRSUTISM 06/16/2009  . CYST, IDIOPATHIC 05/20/2008  . IRRITABLE BOWEL SYNDROME 04/15/2008  . Asthma 03/19/2008  . GERD 03/19/2008  . COLONIC POLYPS, HX OF 03/19/2008  . NEPHROLITHIASIS, HX OF 03/19/2008  . Hypothyroidism 03/18/2008  . Hyperlipidemia 03/18/2008  . INSOMNIA, CHRONIC 03/18/2008  . FIBROMYALGIA 03/18/2008  . Prediabetes 03/18/2008    Alexia Freestone 10/11/2016, 3:23 PM  [place], [place], [address] Phone: [phone]   Fax:  [phone]  Name: Jean Nash MRN: [id number] Date of Birth: October 14, 1941   Allyson Sabal, PT 10/11/16 3:23 PM

## 2016-10-13 ENCOUNTER — Encounter: Payer: Medicare HMO | Admitting: Physical Therapy

## 2016-10-14 ENCOUNTER — Ambulatory Visit: Payer: Medicare HMO | Admitting: Physical Therapy

## 2016-10-14 ENCOUNTER — Encounter: Payer: Self-pay | Admitting: Physical Therapy

## 2016-10-14 DIAGNOSIS — I972 Postmastectomy lymphedema syndrome: Secondary | ICD-10-CM | POA: Diagnosis not present

## 2016-10-14 DIAGNOSIS — R293 Abnormal posture: Secondary | ICD-10-CM

## 2016-10-14 NOTE — Therapy (Signed)
[place], [place], [address] Phone: [phone]   Fax:  [phone]  Physical Therapy Treatment  Patient Details  Name: Jean Nash MRN: [id number] Date of Birth: 1941-02-02 Referring Provider: Magrinat  Encounter Date: 10/14/2016      PT End of Session - 10/14/16 1609    Visit Number 5   Number of Visits 17   Date for PT Re-Evaluation 11/11/16   PT Start Time [id number]   PT Stop Time 1431   PT Time Calculation (min) 39 min   Activity Tolerance Patient tolerated treatment well   Behavior During Therapy [facility] for tasks assessed/performed      Past Medical History:  Diagnosis Date  . Anxiety    PHOBIAS  . Arthritis   . Asthma    hx of -no inhalers, no problems  . Breast cancer ([place]) 08/08/13   right LOQ  . Cataract   . Chronic insomnia   . Cluster headaches    history of migraines / NONE FOR SEVERAL YRS  . Depression   . Fibromyalgia   . GERD (gastroesophageal reflux disease)   . H/O hiatal hernia   . History of colonic polyps   . History of transfusion   . Hx of radiation therapy 10/29/13- 12/14/13   right chest wall 5040 cGy 28 sessions, right supraclavicular/axillary region 5040 cGy 28 sessions, right chest wall boost 1000 cGy 5 sessions  . Hypothyroidism   . Irritable bowel syndrome   . Kidney stone   . Lymphedema    RT ARM - WEARS SLEEVE  . Macular degeneration   . Osteopenia   . Other abnormal glucose   . Other and unspecified hyperlipidemia   . Pain in joint, shoulder region   . Pneumonia [id number]  . Sleep apnea    USES C-PAP  . Stress incontinence, female     Past Surgical History:  Procedure Laterality Date  . ABDOMINAL HYSTERECTOMY    . APPENDECTOMY    . BILATERAL TOTAL MASTECTOMY WITH AXILLARY LYMPH NODE DISSECTION  08/30/2013   Dr Barry Dienes  . BREAST CYST ASPIRATION     9 cysts  . CATARACT EXTRACTION, BILATERAL    . CHOLECYSTECTOMY    . COLONOSCOPY    .  EVACUATION BREAST HEMATOMA Left 08/31/2013   Procedure: EVACUATION HEMATOMA BREAST;  Surgeon: Stark Klein, MD;  Location: [place];  Service: General;  Laterality: Left;  . EYE SURGERY     to repair macular hole  . FOOT ARTHROPLASTY     lt   . GANGLION CYST EXCISION     rt foot  . HEMORRHOID SURGERY     03/1993  . JOINT REPLACEMENT  03/15/11   left knee replacement  . KNEE ARTHROSCOPY     > 10 years ago  . MASS EXCISION  11/04/2011   Procedure: EXCISION MASS;  Surgeon: Cammie Sickle., MD;  Location: [place];  Service: Orthopedics;  Laterality: Right;  excisional biopsy right ulna mass  . MASTECTOMY W/ SENTINEL NODE BIOPSY Right 08/30/2013   Procedure: RIGHT  AXILLARY SENTINEL LYMPH NODE BIOPSY; Right Axillary Node Disection;  Surgeon: Stark Klein, MD;  Location: [place];  Service: General;  Laterality: Right;  Right side nuc med 7:00   . PARTIAL KNEE ARTHROPLASTY Right 11/03/2015   Procedure: RIGHT KNEE MEDIAL UNICOMPARTMENTAL ARTHROPLASTY ;  Surgeon: Gaynelle Arabian, MD;  [facility];  Service: Orthopedics;  Laterality: Right;  . SIMPLE MASTECTOMY WITH AXILLARY SENTINEL NODE  BIOPSY Left 08/30/2013   Procedure: Bilateral Breast Mastectomy ;  Surgeon: Stark Klein, MD;  Location: [place];  Service: General;  Laterality: Left;  . skin tags removed     breast, panty line, neckline  . TOE SURGERY     preventative crossover toe surg/right foot  . TOE SURGERY     left foot/screw  in 2nd toe  . TONSILLECTOMY    . UPPER GASTROINTESTINAL ENDOSCOPY      There were no vitals filed for this visit.      Subjective Assessment - 10/14/16 1354    Subjective I feel much better with the massage but I have this tightness across my chest. It feels like I am always wearing a bra. That is my big compaint   Pertinent History Right arm swelling began around end of March 2016 for unknown reasons.  She had a bilateral mastectomy with a right axillary node dissection removing 16 lymph  nodes and 9 of those were positive.  Currently taking tamoxifen. , tendonitis right knee   Patient Stated Goals get rid of swelling   Currently in Pain? No/denies   Pain Score 0-No pain                         [facility] Adult PT Treatment/Exercise - 10/14/16 0001      Shoulder Exercises: Supine   Other Supine Exercises pec stretch: laying over towel roll with arms abducted to 90 degrees to allow chest stretch     Manual Therapy   Manual Lymphatic Drainage (MLD) Verbal instruction as well as tactile cueing to patient as she performed the following:  short neck, diaphragmatic breathing, left axilla and anterior interaxillary anastomosis, right groin and axillo-inguinal anastomosis, and right UE from dorsal hand to shoulder.                        [facility] Goals - 10/14/16 1355      CC Long Term Goal  #1   Title Patient will report at least 50% improvement in swelling in right upper quadrant   Baseline 10/14/16- pt reports no improvement in swelling in this area   Time 4   Period Weeks   Status On-going     CC Long Term Goal  #2   Title Patient will reports a 50% improvement in chest pain in area of mastectomy scar   Baseline 10/14/16- no improvement   Time 4   Period Weeks   Status On-going     CC Long Term Goal  #3   Title Patient and spouse will be independent in self manual lymphatic drainage technique for long term management of edema   Baseline 10/14/16- pt is feeling more independent with this   Time 4   Period Weeks   Status On-going            Plan - 10/14/16 1610    Clinical Impression Statement Gave pt corner stretch but she did not feel a stretch in her chest despite different positions. Attempted having pt lie over foam roll but with arms outstretched but this led to increased shoulder pain. Had pt lie over towel roll with arms abducted to 90 degrees and pt stated she did feel a stretch with this. Patient feeling much more  comfortable with self drainage technique today. Pt able to demonstrate technique with less cueing today but still required frequent cueing for pressure and hand placement. At the  end of the session she verbalized a good understand of anatomy and physiology of lymphatic system and understood why light pressure was required. Pt would benefit from additional skilled PT visits to progress pt towards independence with self drainage.    Rehab Potential Good   Clinical Impairments Affecting Rehab Potential hx of radiation, chronic pain   PT Frequency 2x / week   PT Duration 4 weeks   PT Treatment/Interventions Manual lymph drainage;Manual techniques;Compression bandaging;Scar mobilization;Taping;Vasopneumatic Device;Therapeutic exercise;ADLs/Self Care Home Management;Patient/family education   PT Next Visit Plan  Cont instruction of MLD to R upper quadrant and assess if pt has been lying over towel roll at home   PT Home Exercise Plan Self manual lymph drainage   Consulted and Agree with Plan of Care Patient      Patient will benefit from skilled therapeutic intervention in order to improve the following deficits and impairments:  Increased edema, Increased fascial restricitons  Visit Diagnosis: Postmastectomy lymphedema - Plan: PT plan of care cert/re-cert  Abnormal posture - Plan: PT plan of care cert/re-cert     Problem List Patient Active Problem List   Diagnosis Date Noted  . OA (osteoarthritis) of knee 11/03/2015  . Osteopenia 08/08/2015  . Chronic insomnia 08/08/2015  . Obstructive sleep apnea 06/25/2015  . Headache 02/03/2015  . Atypical chest pain 07/15/2014  . Arthralgia 02/22/2014  . Psoriasis 02/22/2014  . Breast cancer of lower-outer quadrant of right female breast ([place]) 08/09/2013  . Neck pain 06/22/2013  . Left knee pain 11/29/2012  . Gastroenteritis 04/26/2012  . Reactive depression (situational) 04/26/2012  . Right wrist pain 02/02/2012  . Right foot pain 10/04/2011   . Nevus 06/05/2011  . Status post total knee replacement 04/06/2011  . Overactive bladder 03/14/2011  . KNEE PAIN, LEFT 08/31/2010  . HEARING LOSS 08/17/2010  . HIRSUTISM 06/16/2009  . CYST, IDIOPATHIC 05/20/2008  . IRRITABLE BOWEL SYNDROME 04/15/2008  . Asthma 03/19/2008  . GERD 03/19/2008  . COLONIC POLYPS, HX OF 03/19/2008  . NEPHROLITHIASIS, HX OF 03/19/2008  . Hypothyroidism 03/18/2008  . Hyperlipidemia 03/18/2008  . INSOMNIA, CHRONIC 03/18/2008  . FIBROMYALGIA 03/18/2008  . Prediabetes 03/18/2008    Alexia Freestone 10/14/2016, 4:16 PM  [place], [place], [address] Phone: [phone]   Fax:  [phone]  Name: Jean Nash MRN: [id number] Date of Birth: 1941-06-26   Allyson Sabal, PT 10/14/16 4:18 PM

## 2016-10-19 ENCOUNTER — Ambulatory Visit: Payer: Medicare HMO | Attending: Oncology | Admitting: Physical Therapy

## 2016-10-19 DIAGNOSIS — I972 Postmastectomy lymphedema syndrome: Secondary | ICD-10-CM

## 2016-10-19 DIAGNOSIS — R293 Abnormal posture: Secondary | ICD-10-CM | POA: Diagnosis present

## 2016-10-19 NOTE — Therapy (Signed)
[place], [place], [address] Phone: [phone]   Fax:  [phone]  Physical Therapy Treatment  Patient Details  Name: Jean Nash MRN: [id number] Date of Birth: 27-Oct-1941 Referring Provider: Magrinat  Encounter Date: 10/19/2016      PT End of Session - 10/19/16 1706    Visit Number 6   Number of Visits 17   Date for PT Re-Evaluation 11/11/16   PT Start Time [id number]   PT Stop Time 1600   PT Time Calculation (min) 45 min   Activity Tolerance Patient tolerated treatment well   Behavior During Therapy [facility] for tasks assessed/performed      Past Medical History:  Diagnosis Date  . Anxiety    PHOBIAS  . Arthritis   . Asthma    hx of -no inhalers, no problems  . Breast cancer ([place]) 08/08/13   right LOQ  . Cataract   . Chronic insomnia   . Cluster headaches    history of migraines / NONE FOR SEVERAL YRS  . Depression   . Fibromyalgia   . GERD (gastroesophageal reflux disease)   . H/O hiatal hernia   . History of colonic polyps   . History of transfusion   . Hx of radiation therapy 10/29/13- 12/14/13   right chest wall 5040 cGy 28 sessions, right supraclavicular/axillary region 5040 cGy 28 sessions, right chest wall boost 1000 cGy 5 sessions  . Hypothyroidism   . Irritable bowel syndrome   . Kidney stone   . Lymphedema    RT ARM - WEARS SLEEVE  . Macular degeneration   . Osteopenia   . Other abnormal glucose   . Other and unspecified hyperlipidemia   . Pain in joint, shoulder region   . Pneumonia [id number]  . Sleep apnea    USES C-PAP  . Stress incontinence, female     Past Surgical History:  Procedure Laterality Date  . ABDOMINAL HYSTERECTOMY    . APPENDECTOMY    . BILATERAL TOTAL MASTECTOMY WITH AXILLARY LYMPH NODE DISSECTION  08/30/2013   Dr Barry Dienes  . BREAST CYST ASPIRATION     9 cysts  . CATARACT EXTRACTION, BILATERAL    . CHOLECYSTECTOMY    . COLONOSCOPY    .  EVACUATION BREAST HEMATOMA Left 08/31/2013   Procedure: EVACUATION HEMATOMA BREAST;  Surgeon: Stark Klein, MD;  Location: [place];  Service: General;  Laterality: Left;  . EYE SURGERY     to repair macular hole  . FOOT ARTHROPLASTY     lt   . GANGLION CYST EXCISION     rt foot  . HEMORRHOID SURGERY     03/1993  . JOINT REPLACEMENT  03/15/11   left knee replacement  . KNEE ARTHROSCOPY     > 10 years ago  . MASS EXCISION  11/04/2011   Procedure: EXCISION MASS;  Surgeon: Cammie Sickle., MD;  Location: [place];  Service: Orthopedics;  Laterality: Right;  excisional biopsy right ulna mass  . MASTECTOMY W/ SENTINEL NODE BIOPSY Right 08/30/2013   Procedure: RIGHT  AXILLARY SENTINEL LYMPH NODE BIOPSY; Right Axillary Node Disection;  Surgeon: Stark Klein, MD;  Location: [place];  Service: General;  Laterality: Right;  Right side nuc med 7:00   . PARTIAL KNEE ARTHROPLASTY Right 11/03/2015   Procedure: RIGHT KNEE MEDIAL UNICOMPARTMENTAL ARTHROPLASTY ;  Surgeon: Gaynelle Arabian, MD;  [facility];  Service: Orthopedics;  Laterality: Right;  . SIMPLE MASTECTOMY WITH AXILLARY SENTINEL NODE  BIOPSY Left 08/30/2013   Procedure: Bilateral Breast Mastectomy ;  Surgeon: Stark Klein, MD;  Location: [place];  Service: General;  Laterality: Left;  . skin tags removed     breast, panty line, neckline  . TOE SURGERY     preventative crossover toe surg/right foot  . TOE SURGERY     left foot/screw  in 2nd toe  . TONSILLECTOMY    . UPPER GASTROINTESTINAL ENDOSCOPY      There were no vitals filed for this visit.      Subjective Assessment - 10/19/16 1525    Subjective Pt states that she is doing better.  She is still getting mixed up with getting the sequence mixed up at time.  She has not been lying over the towel roll at home  She says range of motion is not her problem    Pertinent History Right arm swelling began around end of March 2016 for unknown reasons.  She had a bilateral  mastectomy with a right axillary node dissection removing 16 lymph nodes and 9 of those were positive.  Currently taking tamoxifen. , tendonitis right knee   Patient Stated Goals get rid of swelling   Currently in Pain? No/denies                         [facility] Adult PT Treatment/Exercise - 10/19/16 0001      Manual Therapy   Manual Lymphatic Drainage (MLD) Allowed pt lead session as she reviewed manual lypmph draiange .  Used mirror for visual cues as well as verbal instruction as well as tactile cueing to patient as she performed the following:  short neck, diaphragmatic breathing, left axilla and anterior interaxillary anastomosis, right groin and axillo-inguinal anastomosis, and right UE from dorsal hand to shoulder.                        [facility] Goals - 10/14/16 1355      CC Long Term Goal  #1   Title Patient will report at least 50% improvement in swelling in right upper quadrant   Baseline 10/14/16- pt reports no improvement in swelling in this area   Time 4   Period Weeks   Status On-going     CC Long Term Goal  #2   Title Patient will reports a 50% improvement in chest pain in area of mastectomy scar   Baseline 10/14/16- no improvement   Time 4   Period Weeks   Status On-going     CC Long Term Goal  #3   Title Patient and spouse will be independent in self manual lymphatic drainage technique for long term management of edema   Baseline 10/14/16- pt is feeling more independent with this   Time 4   Period Weeks   Status On-going            Plan - 10/19/16 1706    Clinical Impression Statement Reinforced manual lymph drainage with mirror for visual feedback and diminishing cues.  Pt reports she feels she is doing better with this.  Talked about importance of exercise, but pt says she is limited because she still has knee pain despite partial knee replacement    Rehab Potential Good   Clinical Impairments Affecting Rehab  Potential hx of radiation, chronic pain   PT Frequency 2x / week   PT Duration 4 weeks   PT Treatment/Interventions Manual lymph drainage;Manual techniques;Compression bandaging;Scar mobilization;Taping;Vasopneumatic  Device;Therapeutic exercise;ADLs/Self Care Home Management;Patient/family education   PT Next Visit Plan  Cont instruction of MLD to R upper quadrant and assess if pt has been lying over towel roll at home encourage stretching and arm exercise    Consulted and Agree with Plan of Care Patient      Patient will benefit from skilled therapeutic intervention in order to improve the following deficits and impairments:  Increased edema, Increased fascial restricitons  Visit Diagnosis: Postmastectomy lymphedema  Abnormal posture     Problem List Patient Active Problem List   Diagnosis Date Noted  . OA (osteoarthritis) of knee 11/03/2015  . Osteopenia 08/08/2015  . Chronic insomnia 08/08/2015  . Obstructive sleep apnea 06/25/2015  . Headache 02/03/2015  . Atypical chest pain 07/15/2014  . Arthralgia 02/22/2014  . Psoriasis 02/22/2014  . Breast cancer of lower-outer quadrant of right female breast ([place]) 08/09/2013  . Neck pain 06/22/2013  . Left knee pain 11/29/2012  . Gastroenteritis 04/26/2012  . Reactive depression (situational) 04/26/2012  . Right wrist pain 02/02/2012  . Right foot pain 10/04/2011  . Nevus 06/05/2011  . Status post total knee replacement 04/06/2011  . Overactive bladder 03/14/2011  . KNEE PAIN, LEFT 08/31/2010  . HEARING LOSS 08/17/2010  . HIRSUTISM 06/16/2009  . CYST, IDIOPATHIC 05/20/2008  . IRRITABLE BOWEL SYNDROME 04/15/2008  . Asthma 03/19/2008  . GERD 03/19/2008  . COLONIC POLYPS, HX OF 03/19/2008  . NEPHROLITHIASIS, HX OF 03/19/2008  . Hypothyroidism 03/18/2008  . Hyperlipidemia 03/18/2008  . INSOMNIA, CHRONIC 03/18/2008  . FIBROMYALGIA 03/18/2008  . Prediabetes 03/18/2008   Donato Heinz. Owens Shark PT  [place] Levo 10/19/2016,  5:12 PM  [place], [place], [address] Phone: [phone]   Fax:  [phone]  Name: Marqita Becerril MRN: [id number] Date of Birth: September 12, 1941

## 2016-10-21 ENCOUNTER — Ambulatory Visit: Payer: Medicare HMO | Admitting: Physical Therapy

## 2016-10-21 DIAGNOSIS — R293 Abnormal posture: Secondary | ICD-10-CM

## 2016-10-21 DIAGNOSIS — I972 Postmastectomy lymphedema syndrome: Secondary | ICD-10-CM | POA: Diagnosis not present

## 2016-10-21 NOTE — Therapy (Signed)
[place], [place], [address] Phone: [phone]   Fax:  [phone]  Physical Therapy Treatment  Patient Details  Name: Jean Nash MRN: [id number] Date of Birth: Dec 04, 1940 Referring Provider: Magrinat  Encounter Date: 10/21/2016      PT End of Session - 10/21/16 1701    Visit Number 7   Number of Visits 17   Date for PT Re-Evaluation 11/11/16   PT Start Time 1518   PT Stop Time 1600   PT Time Calculation (min) 42 min   Activity Tolerance Patient tolerated treatment well   Behavior During Therapy [facility] for tasks assessed/performed      Past Medical History:  Diagnosis Date  . Anxiety    PHOBIAS  . Arthritis   . Asthma    hx of -no inhalers, no problems  . Breast cancer ([place]) 08/08/13   right LOQ  . Cataract   . Chronic insomnia   . Cluster headaches    history of migraines / NONE FOR SEVERAL YRS  . Depression   . Fibromyalgia   . GERD (gastroesophageal reflux disease)   . H/O hiatal hernia   . History of colonic polyps   . History of transfusion   . Hx of radiation therapy 10/29/13- 12/14/13   right chest wall 5040 cGy 28 sessions, right supraclavicular/axillary region 5040 cGy 28 sessions, right chest wall boost 1000 cGy 5 sessions  . Hypothyroidism   . Irritable bowel syndrome   . Kidney stone   . Lymphedema    RT ARM - WEARS SLEEVE  . Macular degeneration   . Osteopenia   . Other abnormal glucose   . Other and unspecified hyperlipidemia   . Pain in joint, shoulder region   . Pneumonia [id number]  . Sleep apnea    USES C-PAP  . Stress incontinence, female     Past Surgical History:  Procedure Laterality Date  . ABDOMINAL HYSTERECTOMY    . APPENDECTOMY    . BILATERAL TOTAL MASTECTOMY WITH AXILLARY LYMPH NODE DISSECTION  08/30/2013   Dr Barry Dienes  . BREAST CYST ASPIRATION     9 cysts  . CATARACT EXTRACTION, BILATERAL    . CHOLECYSTECTOMY    . COLONOSCOPY    .  EVACUATION BREAST HEMATOMA Left 08/31/2013   Procedure: EVACUATION HEMATOMA BREAST;  Surgeon: Stark Klein, MD;  Location: [place];  Service: General;  Laterality: Left;  . EYE SURGERY     to repair macular hole  . FOOT ARTHROPLASTY     lt   . GANGLION CYST EXCISION     rt foot  . HEMORRHOID SURGERY     03/1993  . JOINT REPLACEMENT  03/15/11   left knee replacement  . KNEE ARTHROSCOPY     > 10 years ago  . MASS EXCISION  11/04/2011   Procedure: EXCISION MASS;  Surgeon: Cammie Sickle., MD;  Location: [place];  Service: Orthopedics;  Laterality: Right;  excisional biopsy right ulna mass  . MASTECTOMY W/ SENTINEL NODE BIOPSY Right 08/30/2013   Procedure: RIGHT  AXILLARY SENTINEL LYMPH NODE BIOPSY; Right Axillary Node Disection;  Surgeon: Stark Klein, MD;  Location: [place];  Service: General;  Laterality: Right;  Right side nuc med 7:00   . PARTIAL KNEE ARTHROPLASTY Right 11/03/2015   Procedure: RIGHT KNEE MEDIAL UNICOMPARTMENTAL ARTHROPLASTY ;  Surgeon: Gaynelle Arabian, MD;  [facility];  Service: Orthopedics;  Laterality: Right;  . SIMPLE MASTECTOMY WITH AXILLARY SENTINEL NODE  BIOPSY Left 08/30/2013   Procedure: Bilateral Breast Mastectomy ;  Surgeon: Stark Klein, MD;  Location: [place];  Service: General;  Laterality: Left;  . skin tags removed     breast, panty line, neckline  . TOE SURGERY     preventative crossover toe surg/right foot  . TOE SURGERY     left foot/screw  in 2nd toe  . TONSILLECTOMY    . UPPER GASTROINTESTINAL ENDOSCOPY      There were no vitals filed for this visit.      Subjective Assessment - 10/21/16 1700    Subjective Pt states she is doing better.  She brings in her sheet for manual lymph draiange and is practicing at home.  She is interested in learning exercises for arm strenthening   Pertinent History Right arm swelling began around end of March 2016 for unknown reasons.  She had a bilateral mastectomy with a right axillary node  dissection removing 16 lymph nodes and 9 of those were positive.  Currently taking tamoxifen. , tendonitis right knee   Patient Stated Goals get rid of swelling   Currently in Pain? No/denies                         [facility] Adult PT Treatment/Exercise - 10/21/16 0001      Manual Therapy   Manual Lymphatic Drainage (MLD) Allowed pt lead session as she reviewed manual lypmph draiange .  Used mirror for visual cues as well as verbal instruction as well as tactile cueing to patient as she performed the following:  short neck, diaphragmatic breathing, left axilla and anterior interaxillary anastomosis, right groin and axillo-inguinal anastomosis, and right UE from dorsal hand to shoulder.                        [facility] Goals - 10/14/16 1355      CC Long Term Goal  #1   Title Patient will report at least 50% improvement in swelling in right upper quadrant   Baseline 10/14/16- pt reports no improvement in swelling in this area   Time 4   Period Weeks   Status On-going     CC Long Term Goal  #2   Title Patient will reports a 50% improvement in chest pain in area of mastectomy scar   Baseline 10/14/16- no improvement   Time 4   Period Weeks   Status On-going     CC Long Term Goal  #3   Title Patient and spouse will be independent in self manual lymphatic drainage technique for long term management of edema   Baseline 10/14/16- pt is feeling more independent with this   Time 4   Period Weeks   Status On-going            Plan - 10/21/16 1702    Clinical Impression Statement Pt seems to be doing better with manual lymph drainage .  She continues to have puffy area at anterior right axilla and some tightness in chest.  She is ready to progress to scapular and shoulder strength exercise    Rehab Potential Good   Clinical Impairments Affecting Rehab Potential hx of radiation, chronic pain   PT Frequency 2x / week   PT Duration 4 weeks   PT  Treatment/Interventions Manual lymph drainage;Manual techniques;Compression bandaging;Scar mobilization;Taping;Vasopneumatic Device;Therapeutic exercise;ADLs/Self Care Home Management;Patient/family education   PT Next Visit Plan Teach supine scapular and standing shoulder isometric exercise,  If time, do manual lymph draiange/myofascial release to anterior chest    Consulted and Agree with Plan of Care Patient      Patient will benefit from skilled therapeutic intervention in order to improve the following deficits and impairments:  Increased edema, Increased fascial restricitons  Visit Diagnosis: Postmastectomy lymphedema  Abnormal posture     Problem List Patient Active Problem List   Diagnosis Date Noted  . OA (osteoarthritis) of knee 11/03/2015  . Osteopenia 08/08/2015  . Chronic insomnia 08/08/2015  . Obstructive sleep apnea 06/25/2015  . Headache 02/03/2015  . Atypical chest pain 07/15/2014  . Arthralgia 02/22/2014  . Psoriasis 02/22/2014  . Breast cancer of lower-outer quadrant of right female breast ([place]) 08/09/2013  . Neck pain 06/22/2013  . Left knee pain 11/29/2012  . Gastroenteritis 04/26/2012  . Reactive depression (situational) 04/26/2012  . Right wrist pain 02/02/2012  . Right foot pain 10/04/2011  . Nevus 06/05/2011  . Status post total knee replacement 04/06/2011  . Overactive bladder 03/14/2011  . KNEE PAIN, LEFT 08/31/2010  . HEARING LOSS 08/17/2010  . HIRSUTISM 06/16/2009  . CYST, IDIOPATHIC 05/20/2008  . IRRITABLE BOWEL SYNDROME 04/15/2008  . Asthma 03/19/2008  . GERD 03/19/2008  . COLONIC POLYPS, HX OF 03/19/2008  . NEPHROLITHIASIS, HX OF 03/19/2008  . Hypothyroidism 03/18/2008  . Hyperlipidemia 03/18/2008  . INSOMNIA, CHRONIC 03/18/2008  . FIBROMYALGIA 03/18/2008  . Prediabetes 03/18/2008   Donato Heinz. Owens Shark PT  [place] Levo 10/21/2016, 5:05 PM  [place], [place], [address] Phone: [phone]   Fax:  [phone]  Name: Loucile Niebur MRN: [id number] Date of Birth: 05-11-1941

## 2016-10-26 ENCOUNTER — Ambulatory Visit: Payer: Medicare HMO | Admitting: Physical Therapy

## 2016-10-26 DIAGNOSIS — I972 Postmastectomy lymphedema syndrome: Secondary | ICD-10-CM

## 2016-10-26 DIAGNOSIS — R293 Abnormal posture: Secondary | ICD-10-CM

## 2016-10-26 NOTE — Therapy (Signed)
[place], [place], [address] Phone: [phone]   Fax:  [phone]  Physical Therapy Treatment  Patient Details  Name: Jean Nash MRN: [id number] Date of Birth: September 21, 1941 Referring Provider: Magrinat  Encounter Date: 10/26/2016      PT End of Session - 10/26/16 1618    Visit Number 8   Number of Visits 17   Date for PT Re-Evaluation 11/11/16   PT Start Time [id number]   PT Stop Time 1600   PT Time Calculation (min) 44 min   Activity Tolerance Patient tolerated treatment well   Behavior During Therapy [facility] for tasks assessed/performed      Past Medical History:  Diagnosis Date  . Anxiety    PHOBIAS  . Arthritis   . Asthma    hx of -no inhalers, no problems  . Breast cancer ([place]) 08/08/13   right LOQ  . Cataract   . Chronic insomnia   . Cluster headaches    history of migraines / NONE FOR SEVERAL YRS  . Depression   . Fibromyalgia   . GERD (gastroesophageal reflux disease)   . H/O hiatal hernia   . History of colonic polyps   . History of transfusion   . Hx of radiation therapy 10/29/13- 12/14/13   right chest wall 5040 cGy 28 sessions, right supraclavicular/axillary region 5040 cGy 28 sessions, right chest wall boost 1000 cGy 5 sessions  . Hypothyroidism   . Irritable bowel syndrome   . Kidney stone   . Lymphedema    RT ARM - WEARS SLEEVE  . Macular degeneration   . Osteopenia   . Other abnormal glucose   . Other and unspecified hyperlipidemia   . Pain in joint, shoulder region   . Pneumonia [id number]  . Sleep apnea    USES C-PAP  . Stress incontinence, female     Past Surgical History:  Procedure Laterality Date  . ABDOMINAL HYSTERECTOMY    . APPENDECTOMY    . BILATERAL TOTAL MASTECTOMY WITH AXILLARY LYMPH NODE DISSECTION  08/30/2013   Dr Barry Dienes  . BREAST CYST ASPIRATION     9 cysts  . CATARACT EXTRACTION, BILATERAL    . CHOLECYSTECTOMY    . COLONOSCOPY    .  EVACUATION BREAST HEMATOMA Left 08/31/2013   Procedure: EVACUATION HEMATOMA BREAST;  Surgeon: Stark Klein, MD;  Location: [place];  Service: General;  Laterality: Left;  . EYE SURGERY     to repair macular hole  . FOOT ARTHROPLASTY     lt   . GANGLION CYST EXCISION     rt foot  . HEMORRHOID SURGERY     03/1993  . JOINT REPLACEMENT  03/15/11   left knee replacement  . KNEE ARTHROSCOPY     > 10 years ago  . MASS EXCISION  11/04/2011   Procedure: EXCISION MASS;  Surgeon: Cammie Sickle., MD;  Location: [place];  Service: Orthopedics;  Laterality: Right;  excisional biopsy right ulna mass  . MASTECTOMY W/ SENTINEL NODE BIOPSY Right 08/30/2013   Procedure: RIGHT  AXILLARY SENTINEL LYMPH NODE BIOPSY; Right Axillary Node Disection;  Surgeon: Stark Klein, MD;  Location: [place];  Service: General;  Laterality: Right;  Right side nuc med 7:00   . PARTIAL KNEE ARTHROPLASTY Right 11/03/2015   Procedure: RIGHT KNEE MEDIAL UNICOMPARTMENTAL ARTHROPLASTY ;  Surgeon: Gaynelle Arabian, MD;  [facility];  Service: Orthopedics;  Laterality: Right;  . SIMPLE MASTECTOMY WITH AXILLARY SENTINEL NODE  BIOPSY Left 08/30/2013   Procedure: Bilateral Breast Mastectomy ;  Surgeon: Stark Klein, MD;  Location: [place];  Service: General;  Laterality: Left;  . skin tags removed     breast, panty line, neckline  . TOE SURGERY     preventative crossover toe surg/right foot  . TOE SURGERY     left foot/screw  in 2nd toe  . TONSILLECTOMY    . UPPER GASTROINTESTINAL ENDOSCOPY      There were no vitals filed for this visit.      Subjective Assessment - 10/26/16 1520    Subjective Pt states she has been doing exercises at home.... horizontal abduction.  She is still having some knee pain, but that is better too.    Pertinent History Right arm swelling began around end of March 2016 for unknown reasons.  She had a bilateral mastectomy with a right axillary node dissection removing 16 lymph nodes and  9 of those were positive.  Currently taking tamoxifen. , tendonitis right knee   Patient Stated Goals get rid of swelling   Currently in Pain? No/denies                         [facility] Adult PT Treatment/Exercise - 10/26/16 0001      Lumbar Exercises: Supine   Bent Knee Raise 10 reps   Bridge 5 reps   Straight Leg Raise 10 reps   Other Supine Lumbar Exercises single knee to chest 5 reps with each leg      Knee/Hip Exercises: Sidelying   Hip ABduction Strengthening;Both;5 reps     Shoulder Exercises: Supine   Other Supine Exercises skill crushers with 2# weight, then small circles slowly with starts/stops with weight      Shoulder Exercises: Seated   Elevation Strengthening;Both;10 reps  diagonal elevation    Theraband Level (Shoulder Elevation) Level 2 (Red)   External Rotation Strengthening;Both;10 reps;Theraband   Theraband Level (Shoulder External Rotation) Level 2 (Red)   Flexion Strengthening;Both;10 reps  wide and narrow grip    Theraband Level (Shoulder Flexion) Level 2 (Red)   Other Seated Exercises horizontal abduction 10 reps with red theraband      Manual Therapy   Manual Therapy Myofascial release   Myofascial Release to right chest and axillary scar    Manual Lymphatic Drainage (MLD) short neck superficial and deep abdominals, right chest and lateral trunk                 PT Education - 10/26/16 1618    Education provided Yes   Education Details scapualar exercises with red theraband    Person(s) Educated Patient   Methods Explanation;Demonstration;Handout   Comprehension Verbalized understanding;Returned demonstration                [facility] Goals - 10/14/16 1355      CC Long Term Goal  #1   Title Patient will report at least 50% improvement in swelling in right upper quadrant   Baseline 10/14/16- pt reports no improvement in swelling in this area   Time 4   Period Weeks   Status On-going     CC Long Term Goal   #2   Title Patient will reports a 50% improvement in chest pain in area of mastectomy scar   Baseline 10/14/16- no improvement   Time 4   Period Weeks   Status On-going     CC Long Term Goal  #3  Title Patient and spouse will be independent in self manual lymphatic drainage technique for long term management of edema   Baseline 10/14/16- pt is feeling more independent with this   Time 4   Period Weeks   Status On-going            Plan - 10/26/16 1618    Clinical Impression Statement Pt continues to make slow gains,  Emphasis today was on strenthening exercise with cues to activate core throughout.    Rehab Potential Good   Clinical Impairments Affecting Rehab Potential hx of radiation, chronic pain   PT Frequency 2x / week   PT Duration 4 weeks   PT Treatment/Interventions Manual lymph drainage;Manual techniques;Compression bandaging;Scar mobilization;Taping;Vasopneumatic Device;Therapeutic exercise;ADLs/Self Care Home Management;Patient/family education   PT Next Visit Plan Teach  standing shoulder isometric exercise, and review LE exercise program If time, do manual lymph draiange/myofascial release to anterior chest    Consulted and Agree with Plan of Care Patient      Patient will benefit from skilled therapeutic intervention in order to improve the following deficits and impairments:  Increased edema, Increased fascial restricitons  Visit Diagnosis: Postmastectomy lymphedema  Abnormal posture     Problem List Patient Active Problem List   Diagnosis Date Noted  . OA (osteoarthritis) of knee 11/03/2015  . Osteopenia 08/08/2015  . Chronic insomnia 08/08/2015  . Obstructive sleep apnea 06/25/2015  . Headache 02/03/2015  . Atypical chest pain 07/15/2014  . Arthralgia 02/22/2014  . Psoriasis 02/22/2014  . Breast cancer of lower-outer quadrant of right female breast ([place]) 08/09/2013  . Neck pain 06/22/2013  . Left knee pain 11/29/2012  . Gastroenteritis  04/26/2012  . Reactive depression (situational) 04/26/2012  . Right wrist pain 02/02/2012  . Right foot pain 10/04/2011  . Nevus 06/05/2011  . Status post total knee replacement 04/06/2011  . Overactive bladder 03/14/2011  . KNEE PAIN, LEFT 08/31/2010  . HEARING LOSS 08/17/2010  . HIRSUTISM 06/16/2009  . CYST, IDIOPATHIC 05/20/2008  . IRRITABLE BOWEL SYNDROME 04/15/2008  . Asthma 03/19/2008  . GERD 03/19/2008  . COLONIC POLYPS, HX OF 03/19/2008  . NEPHROLITHIASIS, HX OF 03/19/2008  . Hypothyroidism 03/18/2008  . Hyperlipidemia 03/18/2008  . INSOMNIA, CHRONIC 03/18/2008  . FIBROMYALGIA 03/18/2008  . Prediabetes 03/18/2008   Donato Heinz. Owens Shark PT  [place] Levo 10/26/2016, 4:23 PM  [place], [place], [address] Phone: [phone]   Fax:  [phone]  Name: Tattianna Shiflett MRN: [id number] Date of Birth: 15-Apr-1941

## 2016-10-26 NOTE — Patient Instructions (Signed)
Over Head Pull: Narrow Grip     DO ALL EXERCISE IN STANDING    On back, knees bent, feet flat, band across thighs, elbows straight but relaxed. Pull hands apart (start). Keeping elbows straight, bring arms up and over head, hands toward floor. Keep pull steady on band. Hold momentarily. Return slowly, keeping pull steady, back to start. Repeat _5-10__ times. Band color _red_____   Side Pull: Double Arm   On back, knees bent, feet flat. Arms perpendicular to body, shoulder level, elbows straight but relaxed. Pull arms out to sides, elbows straight. Resistance band comes across collarbones, hands toward floor. Hold momentarily. Slowly return to starting position. Repeat _5-10__ times. Band color red _____   Sash   On back, knees bent, feet flat, left hand on left hip, right hand above left. Pull right arm DIAGONALLY (hip to shoulder) across chest. Bring right arm along head toward floor. Hold momentarily. Slowly return to starting position. Repeat _5-10__ times. Do with left arm. Band color _red_____   Shoulder Rotation: Double Arm   On back, knees bent, feet flat, elbows tucked at sides, bent 90, hands palms up. Pull hands apart and down toward floor, keeping elbows near sides. Hold momentarily. Slowly return to starting position. Repeat 5-10___ times. Band color _red_____

## 2016-10-28 ENCOUNTER — Ambulatory Visit: Payer: Medicare HMO | Admitting: Physical Therapy

## 2016-10-28 DIAGNOSIS — I972 Postmastectomy lymphedema syndrome: Secondary | ICD-10-CM

## 2016-10-28 DIAGNOSIS — R293 Abnormal posture: Secondary | ICD-10-CM

## 2016-10-28 NOTE — Therapy (Signed)
[place], [place], [address] Phone: [phone]   Fax:  [phone]  Physical Therapy Treatment  Patient Details  Name: Jean Nash MRN: [id number] Date of Birth: 08-06-1941 Referring Provider: Magrinat  Encounter Date: 10/28/2016      PT End of Session - 10/28/16 1609    Visit Number 9   Number of Visits 17   PT Start Time [id number]   PT Stop Time 1600   PT Time Calculation (min) 45 min   Activity Tolerance Patient tolerated treatment well   Behavior During Therapy [facility] for tasks assessed/performed      Past Medical History:  Diagnosis Date  . Anxiety    PHOBIAS  . Arthritis   . Asthma    hx of -no inhalers, no problems  . Breast cancer ([place]) 08/08/13   right LOQ  . Cataract   . Chronic insomnia   . Cluster headaches    history of migraines / NONE FOR SEVERAL YRS  . Depression   . Fibromyalgia   . GERD (gastroesophageal reflux disease)   . H/O hiatal hernia   . History of colonic polyps   . History of transfusion   . Hx of radiation therapy 10/29/13- 12/14/13   right chest wall 5040 cGy 28 sessions, right supraclavicular/axillary region 5040 cGy 28 sessions, right chest wall boost 1000 cGy 5 sessions  . Hypothyroidism   . Irritable bowel syndrome   . Kidney stone   . Lymphedema    RT ARM - WEARS SLEEVE  . Macular degeneration   . Osteopenia   . Other abnormal glucose   . Other and unspecified hyperlipidemia   . Pain in joint, shoulder region   . Pneumonia [id number]  . Sleep apnea    USES C-PAP  . Stress incontinence, female     Past Surgical History:  Procedure Laterality Date  . ABDOMINAL HYSTERECTOMY    . APPENDECTOMY    . BILATERAL TOTAL MASTECTOMY WITH AXILLARY LYMPH NODE DISSECTION  08/30/2013   Dr Barry Dienes  . BREAST CYST ASPIRATION     9 cysts  . CATARACT EXTRACTION, BILATERAL    . CHOLECYSTECTOMY    . COLONOSCOPY    . EVACUATION BREAST HEMATOMA Left 08/31/2013    Procedure: EVACUATION HEMATOMA BREAST;  Surgeon: Stark Klein, MD;  Location: [place];  Service: General;  Laterality: Left;  . EYE SURGERY     to repair macular hole  . FOOT ARTHROPLASTY     lt   . GANGLION CYST EXCISION     rt foot  . HEMORRHOID SURGERY     03/1993  . JOINT REPLACEMENT  03/15/11   left knee replacement  . KNEE ARTHROSCOPY     > 10 years ago  . MASS EXCISION  11/04/2011   Procedure: EXCISION MASS;  Surgeon: Cammie Sickle., MD;  Location: [place];  Service: Orthopedics;  Laterality: Right;  excisional biopsy right ulna mass  . MASTECTOMY W/ SENTINEL NODE BIOPSY Right 08/30/2013   Procedure: RIGHT  AXILLARY SENTINEL LYMPH NODE BIOPSY; Right Axillary Node Disection;  Surgeon: Stark Klein, MD;  Location: [place];  Service: General;  Laterality: Right;  Right side nuc med 7:00   . PARTIAL KNEE ARTHROPLASTY Right 11/03/2015   Procedure: RIGHT KNEE MEDIAL UNICOMPARTMENTAL ARTHROPLASTY ;  Surgeon: Gaynelle Arabian, MD;  [facility];  Service: Orthopedics;  Laterality: Right;  . SIMPLE MASTECTOMY WITH AXILLARY SENTINEL NODE BIOPSY Left 08/30/2013   Procedure: Bilateral  Breast Mastectomy ;  Surgeon: Stark Klein, MD;  Location: [place];  Service: General;  Laterality: Left;  . skin tags removed     breast, panty line, neckline  . TOE SURGERY     preventative crossover toe surg/right foot  . TOE SURGERY     left foot/screw  in 2nd toe  . TONSILLECTOMY    . UPPER GASTROINTESTINAL ENDOSCOPY      There were no vitals filed for this visit.      Subjective Assessment - 10/28/16 1536    Subjective Pt states she is having lots of "twinges" of pain in various places around her body.  She has had pain in hands. knees, back,    Pertinent History Right arm swelling began around end of March 2016 for unknown reasons.  She had a bilateral mastectomy with a right axillary node dissection removing 16 lymph nodes and 9 of those were positive.  Currently taking  tamoxifen. , tendonitis right knee   Patient Stated Goals get rid of swelling   Currently in Pain? Yes   Pain Score 3    Pain Location Hand   Pain Orientation Left   Pain Descriptors / Indicators Sharp   Pain Type Chronic pain   Pain Radiating Towards various places around the body                          [facility] Adult PT Treatment/Exercise - 10/28/16 0001      Self-Care   Other Self-Care Comments  talked with patinet about fibromyalgia water exercise classess at [facility]     Knee/Hip Exercises: Standing   Heel Raises Both;10 reps   Hip Flexion AROM;Both;10 reps  toe tap on 8" step   Forward Step Up Both;5 reps   Other Standing Knee Exercises side step x 10 reps    Other Standing Knee Exercises wall taps x 10 reps      Knee/Hip Exercises: Supine   Short Arc Quad Sets Strengthening;Left;3 sets;10 reps   Short Arc Quad Sets Limitations 1 set each  no weight, 2 pounds 5 pounds      Shoulder Exercises: Sidelying   ABduction AROM;Right;10 reps   Other Sidelying Exercises small circles with hand to ceiling and hand at 45 abduction.     Shoulder Exercises: Standing   Other Standing Exercises bicep curls with 2# with core activated      Manual Therapy   Manual Lymphatic Drainage (MLD) short neck superficial and deep abdominals, right chest and lateral trunk  then to sidelying for posterior interaxillay anastamosis and lateral trunk                         Long Term Clinic Goals - 10/14/16 1355      CC Long Term Goal  #1   Title Patient will report at least 50% improvement in swelling in right upper quadrant   Baseline 10/14/16- pt reports no improvement in swelling in this area   Time 4   Period Weeks   Status On-going     CC Long Term Goal  #2   Title Patient will reports a 50% improvement in chest pain in area of mastectomy scar   Baseline 10/14/16- no improvement   Time 4   Period Weeks   Status On-going     CC Long Term Goal  #3   Title  Patient and spouse will be independent in self manual lymphatic  drainage technique for long term management of edema   Baseline 10/14/16- pt is feeling more independent with this   Time 4   Period Weeks   Status On-going            Plan - 10/28/16 1609    Clinical Impression Statement pt continues with diffuse pain with she attributes to fibromyalgia, but she is working on increasing exercise and reports making small gains    Rehab Potential Good   Clinical Impairments Affecting Rehab Potential hx of radiation, chronic pain   PT Frequency 2x / week   PT Duration 4 weeks   PT Treatment/Interventions Manual lymph drainage;Manual techniques;Compression bandaging;Scar mobilization;Taping;Vasopneumatic Device;Therapeutic exercise;ADLs/Self Care Home Management;Patient/family education   PT Next Visit Plan Teach  standing shoulder isometric exercise, and review LE exercise program If time, do manual lymph draiange/myofascial release to anterior chest    Consulted and Agree with Plan of Care Patient      Patient will benefit from skilled therapeutic intervention in order to improve the following deficits and impairments:  Increased edema, Increased fascial restricitons  Visit Diagnosis: Postmastectomy lymphedema  Abnormal posture     Problem List Patient Active Problem List   Diagnosis Date Noted  . OA (osteoarthritis) of knee 11/03/2015  . Osteopenia 08/08/2015  . Chronic insomnia 08/08/2015  . Obstructive sleep apnea 06/25/2015  . Headache 02/03/2015  . Atypical chest pain 07/15/2014  . Arthralgia 02/22/2014  . Psoriasis 02/22/2014  . Breast cancer of lower-outer quadrant of right female breast ([place]) 08/09/2013  . Neck pain 06/22/2013  . Left knee pain 11/29/2012  . Gastroenteritis 04/26/2012  . Reactive depression (situational) 04/26/2012  . Right wrist pain 02/02/2012  . Right foot pain 10/04/2011  . Nevus 06/05/2011  . Status post total knee replacement 04/06/2011   . Overactive bladder 03/14/2011  . KNEE PAIN, LEFT 08/31/2010  . HEARING LOSS 08/17/2010  . HIRSUTISM 06/16/2009  . CYST, IDIOPATHIC 05/20/2008  . IRRITABLE BOWEL SYNDROME 04/15/2008  . Asthma 03/19/2008  . GERD 03/19/2008  . COLONIC POLYPS, HX OF 03/19/2008  . NEPHROLITHIASIS, HX OF 03/19/2008  . Hypothyroidism 03/18/2008  . Hyperlipidemia 03/18/2008  . INSOMNIA, CHRONIC 03/18/2008  . FIBROMYALGIA 03/18/2008  . Prediabetes 03/18/2008   Donato Heinz. Owens Shark PT  Norwood Levo 10/28/2016, 4:11 PM  [place], [place], [address] Phone: [phone]   Fax:  [phone]  Name: Jean Nash MRN: [id number] Date of Birth: [birth year]

## 2016-11-02 ENCOUNTER — Ambulatory Visit (INDEPENDENT_AMBULATORY_CARE_PROVIDER_SITE_OTHER): Payer: Medicare HMO | Admitting: Podiatry

## 2016-11-02 ENCOUNTER — Encounter: Payer: Self-pay | Admitting: Podiatry

## 2016-11-02 DIAGNOSIS — M79676 Pain in unspecified toe(s): Secondary | ICD-10-CM | POA: Diagnosis not present

## 2016-11-02 DIAGNOSIS — B351 Tinea unguium: Secondary | ICD-10-CM

## 2016-11-03 ENCOUNTER — Ambulatory Visit: Payer: Medicare HMO

## 2016-11-03 DIAGNOSIS — I972 Postmastectomy lymphedema syndrome: Secondary | ICD-10-CM | POA: Diagnosis not present

## 2016-11-03 DIAGNOSIS — R293 Abnormal posture: Secondary | ICD-10-CM

## 2016-11-03 NOTE — Therapy (Signed)
[place], [place], [address] Phone: [phone]   Fax:  [phone]  Physical Therapy Treatment  Patient Details  Name: Jean Nash MRN: [id number] Date of Birth: 1941-10-18 Referring Provider: Magrinat  Encounter Date: 11/03/2016      PT End of Session - 11/03/16 1606    Visit Number 10   Number of Visits 17   Date for PT Re-Evaluation 11/11/16   PT Start Time 1525   PT Stop Time 1605   PT Time Calculation (min) 40 min   Activity Tolerance Patient tolerated treatment well;Patient limited by pain   Behavior During Therapy [facility] for tasks assessed/performed      Past Medical History:  Diagnosis Date  . Anxiety    PHOBIAS  . Arthritis   . Asthma    hx of -no inhalers, no problems  . Breast cancer ([place]) 08/08/13   right LOQ  . Cataract   . Chronic insomnia   . Cluster headaches    history of migraines / NONE FOR SEVERAL YRS  . Depression   . Fibromyalgia   . GERD (gastroesophageal reflux disease)   . H/O hiatal hernia   . History of colonic polyps   . History of transfusion   . Hx of radiation therapy 10/29/13- 12/14/13   right chest wall 5040 cGy 28 sessions, right supraclavicular/axillary region 5040 cGy 28 sessions, right chest wall boost 1000 cGy 5 sessions  . Hypothyroidism   . Irritable bowel syndrome   . Kidney stone   . Lymphedema    RT ARM - WEARS SLEEVE  . Macular degeneration   . Osteopenia   . Other abnormal glucose   . Other and unspecified hyperlipidemia   . Pain in joint, shoulder region   . Pneumonia [id number]  . Sleep apnea    USES C-PAP  . Stress incontinence, female     Past Surgical History:  Procedure Laterality Date  . ABDOMINAL HYSTERECTOMY    . APPENDECTOMY    . BILATERAL TOTAL MASTECTOMY WITH AXILLARY LYMPH NODE DISSECTION  08/30/2013   Dr Barry Dienes  . BREAST CYST ASPIRATION     9 cysts  . CATARACT EXTRACTION, BILATERAL    . CHOLECYSTECTOMY    .  COLONOSCOPY    . EVACUATION BREAST HEMATOMA Left 08/31/2013   Procedure: EVACUATION HEMATOMA BREAST;  Surgeon: Stark Klein, MD;  Location: [place];  Service: General;  Laterality: Left;  . EYE SURGERY     to repair macular hole  . FOOT ARTHROPLASTY     lt   . GANGLION CYST EXCISION     rt foot  . HEMORRHOID SURGERY     03/1993  . JOINT REPLACEMENT  03/15/11   left knee replacement  . KNEE ARTHROSCOPY     > 10 years ago  . MASS EXCISION  11/04/2011   Procedure: EXCISION MASS;  Surgeon: Cammie Sickle., MD;  Location: [place];  Service: Orthopedics;  Laterality: Right;  excisional biopsy right ulna mass  . MASTECTOMY W/ SENTINEL NODE BIOPSY Right 08/30/2013   Procedure: RIGHT  AXILLARY SENTINEL LYMPH NODE BIOPSY; Right Axillary Node Disection;  Surgeon: Stark Klein, MD;  Location: [place];  Service: General;  Laterality: Right;  Right side nuc med 7:00   . PARTIAL KNEE ARTHROPLASTY Right 11/03/2015   Procedure: RIGHT KNEE MEDIAL UNICOMPARTMENTAL ARTHROPLASTY ;  Surgeon: Gaynelle Arabian, MD;  [facility];  Service: Orthopedics;  Laterality: Right;  . SIMPLE MASTECTOMY WITH  AXILLARY SENTINEL NODE BIOPSY Left 08/30/2013   Procedure: Bilateral Breast Mastectomy ;  Surgeon: Stark Klein, MD;  Location: [facility];  Service: General;  Laterality: Left;  . skin tags removed     breast, panty line, neckline  . TOE SURGERY     preventative crossover toe surg/right foot  . TOE SURGERY     left foot/screw  in 2nd toe  . TONSILLECTOMY    . UPPER GASTROINTESTINAL ENDOSCOPY      There were no vitals filed for this visit.      Subjective Assessment - 11/03/16 1530    Subjective My Rt knee has really been hurting the last 3 days, so much so that I had to take an oxycodone yesterday. I've just been in constant pain. I have an appt Tuesday with Dr. Tresa Moore office   Pertinent History Right arm swelling began around end of March 2016 for unknown reasons.  She had a bilateral  mastectomy with a right axillary node dissection removing 16 lymph nodes and 9 of those were positive.  Currently taking tamoxifen. , tendonitis right knee   Patient Stated Goals get rid of swelling   Currently in Pain? Yes   Pain Score 9    Pain Location Knee   Pain Orientation Left   Pain Descriptors / Indicators Constant;Dull;Shooting   Pain Type Chronic pain   Pain Onset More than a month ago   Pain Relieving Factors has been hurting worse in past few days but has never felt right since partial knee replacement   Effect of Pain on Daily Activities sitting with knee extended                         [facility] Adult PT Treatment/Exercise - 11/03/16 0001      Shoulder Exercises: Seated   Flexion Strengthening;Both;10 reps  To shoulder height   Flexion Weight (lbs) 2   Other Seated Exercises Bil scaption with 2 lbs to shoulder height     Shoulder Exercises: Isometric Strengthening   Flexion 5X5"  Rt UE   External Rotation 5X5"  Rt UE     Manual Therapy   Manual Lymphatic Drainage (MLD) short neck, superficial and deep abdominals, Rt inguinal nodes, Rt axillo-inguinal anastomosis, right chest and lateral trunk and upper Rt arm.                         [facility] Goals - 10/14/16 1355      CC Long Term Goal  #1   Title Patient will report at least 50% improvement in swelling in right upper quadrant   Baseline 10/14/16- pt reports no improvement in swelling in this area   Time 4   Period Weeks   Status On-going     CC Long Term Goal  #2   Title Patient will reports a 50% improvement in chest pain in area of mastectomy scar   Baseline 10/14/16- no improvement   Time 4   Period Weeks   Status On-going     CC Long Term Goal  #3   Title Patient and spouse will be independent in self manual lymphatic drainage technique for long term management of edema   Baseline 10/14/16- pt is feeling more independent with this   Time 4   Period Weeks    Status On-going            Plan - 11/03/16 1715  Clinical Impression Statement Pt came in c/o of 9-10/10 Rt knee pain and reports it has been this bad for the past 3 days. She has an appt with Dr. Maretta Los' office (where she had her partial knee replacement last year) on Tuesday for what she hopes will result in her getting a cortisone shot at least as she leaves for [place] 11/23/16. She tolerated seated UE exercises well today and instructed pt in isometric strengtheing for her Rt shoulder as well, though we did these in sitting. She was unable to tolerate any standing exercises due to her knee pain.     Rehab Potential Good   Clinical Impairments Affecting Rehab Potential hx of radiation, chronic pain   PT Frequency 2x / week   PT Duration 4 weeks   PT Treatment/Interventions Manual lymph drainage;Manual techniques;Compression bandaging;Scar mobilization;Taping;Vasopneumatic Device;Therapeutic exercise;ADLs/Self Care Home Management;Patient/family education   PT Next Visit Plan Review shoulder isometric exercise, and review LE exercise program If time, do manual lymph draiange/myofascial release to anterior chest    Consulted and Agree with Plan of Care Patient      Patient will benefit from skilled therapeutic intervention in order to improve the following deficits and impairments:  Increased edema, Increased fascial restricitons  Visit Diagnosis: Postmastectomy lymphedema  Abnormal posture     Problem List Patient Active Problem List   Diagnosis Date Noted  . OA (osteoarthritis) of knee 11/03/2015  . Osteopenia 08/08/2015  . Chronic insomnia 08/08/2015  . Obstructive sleep apnea 06/25/2015  . Headache 02/03/2015  . Atypical chest pain 07/15/2014  . Arthralgia 02/22/2014  . Psoriasis 02/22/2014  . Breast cancer of lower-outer quadrant of right female breast ([place]) 08/09/2013  . Neck pain 06/22/2013  . Left knee pain 11/29/2012  . Gastroenteritis 04/26/2012  . Reactive  depression (situational) 04/26/2012  . Right wrist pain 02/02/2012  . Right foot pain 10/04/2011  . Nevus 06/05/2011  . Status post total knee replacement 04/06/2011  . Overactive bladder 03/14/2011  . KNEE PAIN, LEFT 08/31/2010  . HEARING LOSS 08/17/2010  . HIRSUTISM 06/16/2009  . CYST, IDIOPATHIC 05/20/2008  . IRRITABLE BOWEL SYNDROME 04/15/2008  . Asthma 03/19/2008  . GERD 03/19/2008  . COLONIC POLYPS, HX OF 03/19/2008  . NEPHROLITHIASIS, HX OF 03/19/2008  . Hypothyroidism 03/18/2008  . Hyperlipidemia 03/18/2008  . INSOMNIA, CHRONIC 03/18/2008  . FIBROMYALGIA 03/18/2008  . Prediabetes 03/18/2008    Otelia Limes, PTA 11/03/2016, 5:23 PM  [place], [place], [address] Phone: [phone]   Fax:  [phone]  Name: Catherine Mccreedy MRN: [id number] Date of Birth: June 09, 1941  Serafina Royals, PT 11/03/16 5:28 PM

## 2016-11-03 NOTE — Patient Instructions (Signed)
Strengthening: Isometric Flexion      Cancer Rehab [phone]  Using wall for resistance, press right fist into ball using light pressure. Hold __5__ seconds. Repeat _10___ times per set. Do __2__ sessions per day.   Extension (Isometric)  Place left bent elbow and back of arm against wall. Press elbow against wall. Hold _5___ seconds. Repeat __10__ times. Do _2___ sessions per day.   External Rotation (Isometric)  Place back of left fist against door frame, with elbow bent. Press fist against door frame. Hold _5___ seconds. Repeat _10___ times. Do __2__ sessions per day.  Copyright  VHI. All rights reserved.

## 2016-11-03 NOTE — Progress Notes (Signed)
She presents today complaining of multiple aches and pains throughout her body but is complaining also of painful elongated toenails.  Objective: Vital signs are stable she is alert and oriented 3. Pulses are palpable. Nails are thick yellow dystrophic onychomycotic painful palpation as well as debridement.  Assessment: Limb senior onychomycosis 1 through 5 bilateral.  Plan: Debridement of onychomycotic nails 1 through 5 bilateral. Follow up with me in 3 months. Remembers asked Her trip to [place] went.

## 2016-11-04 ENCOUNTER — Encounter: Payer: Self-pay | Admitting: Family Medicine

## 2016-11-05 ENCOUNTER — Ambulatory Visit: Payer: Medicare HMO | Admitting: Physical Therapy

## 2016-11-05 DIAGNOSIS — I972 Postmastectomy lymphedema syndrome: Secondary | ICD-10-CM

## 2016-11-05 DIAGNOSIS — R293 Abnormal posture: Secondary | ICD-10-CM

## 2016-11-05 NOTE — Therapy (Signed)
[place], [place], [address] Phone: [phone]   Fax:  [phone]  Physical Therapy Treatment  Patient Details  Name: Jean Nash MRN: [id number] Date of Birth: 09/11/1941 Referring Provider: Magrinat  Encounter Date: 11/05/2016      PT End of Session - 11/05/16 1110    Visit Number 11   Number of Visits 17   Date for PT Re-Evaluation 11/11/16   PT Start Time 1100   PT Stop Time 1147   PT Time Calculation (min) 47 min   Activity Tolerance Patient tolerated treatment well   Behavior During Therapy [facility] for tasks assessed/performed      Past Medical History:  Diagnosis Date  . Anxiety    PHOBIAS  . Arthritis   . Asthma    hx of -no inhalers, no problems  . Breast cancer ([place]) 08/08/13   right LOQ  . Cataract   . Chronic insomnia   . Cluster headaches    history of migraines / NONE FOR SEVERAL YRS  . Depression   . Fibromyalgia   . GERD (gastroesophageal reflux disease)   . H/O hiatal hernia   . History of colonic polyps   . History of transfusion   . Hx of radiation therapy 10/29/13- 12/14/13   right chest wall 5040 cGy 28 sessions, right supraclavicular/axillary region 5040 cGy 28 sessions, right chest wall boost 1000 cGy 5 sessions  . Hypothyroidism   . Irritable bowel syndrome   . Kidney stone   . Lymphedema    RT ARM - WEARS SLEEVE  . Macular degeneration   . Osteopenia   . Other abnormal glucose   . Other and unspecified hyperlipidemia   . Pain in joint, shoulder region   . Pneumonia [id number]  . Sleep apnea    USES C-PAP  . Stress incontinence, female     Past Surgical History:  Procedure Laterality Date  . ABDOMINAL HYSTERECTOMY    . APPENDECTOMY    . BILATERAL TOTAL MASTECTOMY WITH AXILLARY LYMPH NODE DISSECTION  08/30/2013   Dr Barry Dienes  . BREAST CYST ASPIRATION     9 cysts  . CATARACT EXTRACTION, BILATERAL    . CHOLECYSTECTOMY    . COLONOSCOPY    .  EVACUATION BREAST HEMATOMA Left 08/31/2013   Procedure: EVACUATION HEMATOMA BREAST;  Surgeon: Stark Klein, MD;  Location: [place];  Service: General;  Laterality: Left;  . EYE SURGERY     to repair macular hole  . FOOT ARTHROPLASTY     lt   . GANGLION CYST EXCISION     rt foot  . HEMORRHOID SURGERY     03/1993  . JOINT REPLACEMENT  03/15/11   left knee replacement  . KNEE ARTHROSCOPY     > 10 years ago  . MASS EXCISION  11/04/2011   Procedure: EXCISION MASS;  Surgeon: Cammie Sickle., MD;  Location: [place];  Service: Orthopedics;  Laterality: Right;  excisional biopsy right ulna mass  . MASTECTOMY W/ SENTINEL NODE BIOPSY Right 08/30/2013   Procedure: RIGHT  AXILLARY SENTINEL LYMPH NODE BIOPSY; Right Axillary Node Disection;  Surgeon: Stark Klein, MD;  Location: [place];  Service: General;  Laterality: Right;  Right side nuc med 7:00   . PARTIAL KNEE ARTHROPLASTY Right 11/03/2015   Procedure: RIGHT KNEE MEDIAL UNICOMPARTMENTAL ARTHROPLASTY ;  Surgeon: Gaynelle Arabian, MD;  [facility];  Service: Orthopedics;  Laterality: Right;  . SIMPLE MASTECTOMY WITH AXILLARY SENTINEL NODE  BIOPSY Left 08/30/2013   Procedure: Bilateral Breast Mastectomy ;  Surgeon: Stark Klein, MD;  Location: [place];  Service: General;  Laterality: Left;  . skin tags removed     breast, panty line, neckline  . TOE SURGERY     preventative crossover toe surg/right foot  . TOE SURGERY     left foot/screw  in 2nd toe  . TONSILLECTOMY    . UPPER GASTROINTESTINAL ENDOSCOPY      There were no vitals filed for this visit.      Subjective Assessment - 11/05/16 1107    Subjective Pt feels much better today.  She thinks that she has been having more pain from weather changes. And she feels that the fibromayalgia pain is worse than the arthritis pain.  She having a cortisone shot in her knee next Tuesday.   Pertinent History Right arm swelling began around end of March 2016 for unknown reasons.  She  had a bilateral mastectomy with a right axillary node dissection removing 16 lymph nodes and 9 of those were positive.  Currently taking tamoxifen. , tendonitis right knee   Patient Stated Goals get rid of swelling   Currently in Pain? Yes   Pain Score 3    Pain Location Knee   Pain Orientation Right   Pain Descriptors / Indicators Constant   Pain Type Chronic pain   Aggravating Factors  gets worse when he wallks, but weather definitely affects it.                          [facility] Adult PT Treatment/Exercise - 11/05/16 0001      Knee/Hip Exercises: Seated   Long Arc Quad Right;2 sets;10 reps   Long Arc Quad Weight 0 lbs.  no pain    Long Arc Quad Limitations 10 with foot in external rotations      Knee/Hip Exercises: Supine   Straight Leg Raises Strengthening;Both;10 reps   Straight Leg Raises Limitations +     Shoulder Exercises: Seated   Flexion Strengthening;Both;20 reps  To shoulder height   Flexion Weight (lbs) 2  also one set of 10 reps      Shoulder Exercises: Standing   Other Standing Exercises reviewed standing isometric exercise      Manual Therapy   Manual Lymphatic Drainage (MLD) short neck, superficial and deep abdominals, Rt inguinal nodes, Rt axillo-inguinal anastomosis, right chest and lateral trunk and upper Rt arm.                         [facility] Goals - 10/14/16 1355      CC Long Term Goal  #1   Title Patient will report at least 50% improvement in swelling in right upper quadrant   Baseline 10/14/16- pt reports no improvement in swelling in this area   Time 4   Period Weeks   Status On-going     CC Long Term Goal  #2   Title Patient will reports a 50% improvement in chest pain in area of mastectomy scar   Baseline 10/14/16- no improvement   Time 4   Period Weeks   Status On-going     CC Long Term Goal  #3   Title Patient and spouse will be independent in self manual lymphatic drainage technique for long  term management of edema   Baseline 10/14/16- pt is feeling more independent with this   Time 4  Period Weeks   Status On-going            Plan - 11/05/16 1154    Clinical Impression Statement Pt is doing much better today and is more active at home. She is happy with her progress and looking forward to her cortisone shot next week  Should be ready to discharge in 2 more visits   Rehab Potential Good   Clinical Impairments Affecting Rehab Potential hx of radiation, chronic pain   PT Next Visit Plan Review shoulder isometric exercise, and review LE exercise program If time, do manual lymph draiange/myofascial release to anterior chest       Patient will benefit from skilled therapeutic intervention in order to improve the following deficits and impairments:  Increased edema, Increased fascial restricitons  Visit Diagnosis: Postmastectomy lymphedema  Abnormal posture     Problem List Patient Active Problem List   Diagnosis Date Noted  . OA (osteoarthritis) of knee 11/03/2015  . Osteopenia 08/08/2015  . Chronic insomnia 08/08/2015  . Obstructive sleep apnea 06/25/2015  . Headache 02/03/2015  . Atypical chest pain 07/15/2014  . Arthralgia 02/22/2014  . Psoriasis 02/22/2014  . Breast cancer of lower-outer quadrant of right female breast ([place]) 08/09/2013  . Neck pain 06/22/2013  . Left knee pain 11/29/2012  . Gastroenteritis 04/26/2012  . Reactive depression (situational) 04/26/2012  . Right wrist pain 02/02/2012  . Right foot pain 10/04/2011  . Nevus 06/05/2011  . Status post total knee replacement 04/06/2011  . Overactive bladder 03/14/2011  . KNEE PAIN, LEFT 08/31/2010  . HEARING LOSS 08/17/2010  . HIRSUTISM 06/16/2009  . CYST, IDIOPATHIC 05/20/2008  . IRRITABLE BOWEL SYNDROME 04/15/2008  . Asthma 03/19/2008  . GERD 03/19/2008  . COLONIC POLYPS, HX OF 03/19/2008  . NEPHROLITHIASIS, HX OF 03/19/2008  . Hypothyroidism 03/18/2008  . Hyperlipidemia 03/18/2008   . INSOMNIA, CHRONIC 03/18/2008  . FIBROMYALGIA 03/18/2008  . Prediabetes 03/18/2008   Donato Heinz. Owens Shark PT  Norwood Levo 11/05/2016, 11:58 AM  [place], [place], [address] Phone: [phone]   Fax:  [phone]  Name: Kristine Winget MRN: [id number] Date of Birth: 10-02-1941

## 2016-11-09 ENCOUNTER — Ambulatory Visit: Payer: Medicare HMO | Admitting: Physical Therapy

## 2016-11-09 DIAGNOSIS — I972 Postmastectomy lymphedema syndrome: Secondary | ICD-10-CM

## 2016-11-09 DIAGNOSIS — Z471 Aftercare following joint replacement surgery: Secondary | ICD-10-CM | POA: Diagnosis not present

## 2016-11-09 DIAGNOSIS — Z96651 Presence of right artificial knee joint: Secondary | ICD-10-CM | POA: Diagnosis not present

## 2016-11-09 DIAGNOSIS — M7051 Other bursitis of knee, right knee: Secondary | ICD-10-CM | POA: Diagnosis not present

## 2016-11-09 NOTE — Therapy (Signed)
[place], [place], [address] Phone: [phone]   Fax:  [phone]  Physical Therapy Treatment  Patient Details  Name: Jean Nash MRN: [id number] Date of Birth: Aug 26, 1941 Referring Provider: Magrinat  Encounter Date: 11/09/2016      PT End of Session - 11/09/16 1600    Visit Number 12   Number of Visits 17   Date for PT Re-Evaluation 11/11/16   PT Start Time 1504   PT Stop Time 1552   PT Time Calculation (min) 48 min   Activity Tolerance Patient tolerated treatment well   Behavior During Therapy [facility] for tasks assessed/performed      Past Medical History:  Diagnosis Date  . Anxiety    PHOBIAS  . Arthritis   . Asthma    hx of -no inhalers, no problems  . Breast cancer ([place]) 08/08/13   right LOQ  . Cataract   . Chronic insomnia   . Cluster headaches    history of migraines / NONE FOR SEVERAL YRS  . Depression   . Fibromyalgia   . GERD (gastroesophageal reflux disease)   . H/O hiatal hernia   . History of colonic polyps   . History of transfusion   . Hx of radiation therapy 10/29/13- 12/14/13   right chest wall 5040 cGy 28 sessions, right supraclavicular/axillary region 5040 cGy 28 sessions, right chest wall boost 1000 cGy 5 sessions  . Hypothyroidism   . Irritable bowel syndrome   . Kidney stone   . Lymphedema    RT ARM - WEARS SLEEVE  . Macular degeneration   . Osteopenia   . Other abnormal glucose   . Other and unspecified hyperlipidemia   . Pain in joint, shoulder region   . Pneumonia 4492,0100  . Sleep apnea    USES C-PAP  . Stress incontinence, female     Past Surgical History:  Procedure Laterality Date  . ABDOMINAL HYSTERECTOMY    . APPENDECTOMY    . BILATERAL TOTAL MASTECTOMY WITH AXILLARY LYMPH NODE DISSECTION  08/30/2013   Dr Barry Dienes  . BREAST CYST ASPIRATION     9 cysts  . CATARACT EXTRACTION, BILATERAL    . CHOLECYSTECTOMY    . COLONOSCOPY    .  EVACUATION BREAST HEMATOMA Left 08/31/2013   Procedure: EVACUATION HEMATOMA BREAST;  Surgeon: Stark Klein, MD;  Location: [place];  Service: General;  Laterality: Left;  . EYE SURGERY     to repair macular hole  . FOOT ARTHROPLASTY     lt   . GANGLION CYST EXCISION     rt foot  . HEMORRHOID SURGERY     03/1993  . JOINT REPLACEMENT  03/15/11   left knee replacement  . KNEE ARTHROSCOPY     > 10 years ago  . MASS EXCISION  11/04/2011   Procedure: EXCISION MASS;  Surgeon: Cammie Sickle., MD;  Location: [place];  Service: Orthopedics;  Laterality: Right;  excisional biopsy right ulna mass  . MASTECTOMY W/ SENTINEL NODE BIOPSY Right 08/30/2013   Procedure: RIGHT  AXILLARY SENTINEL LYMPH NODE BIOPSY; Right Axillary Node Disection;  Surgeon: Stark Klein, MD;  Location: [place];  Service: General;  Laterality: Right;  Right side nuc med 7:00   . PARTIAL KNEE ARTHROPLASTY Right 11/03/2015   Procedure: RIGHT KNEE MEDIAL UNICOMPARTMENTAL ARTHROPLASTY ;  Surgeon: Gaynelle Arabian, MD;  [facility];  Service: Orthopedics;  Laterality: Right;  . SIMPLE MASTECTOMY WITH AXILLARY SENTINEL NODE  BIOPSY Left 08/30/2013   Procedure: Bilateral Breast Mastectomy ;  Surgeon: Stark Klein, MD;  Location: [place];  Service: General;  Laterality: Left;  . skin tags removed     breast, panty line, neckline  . TOE SURGERY     preventative crossover toe surg/right foot  . TOE SURGERY     left foot/screw  in 2nd toe  . TONSILLECTOMY    . UPPER GASTROINTESTINAL ENDOSCOPY      There were no vitals filed for this visit.      Subjective Assessment - 11/09/16 1507    Subjective "I'm walking.  I got the cortisone shot in my knee."   Currently in Pain? No/denies                         [facility] Adult PT Treatment/Exercise - 11/09/16 0001      Shoulder Exercises: Isometric Strengthening   Flexion 3X3"  standing   Extension 3X5"  standing   External Rotation 3X3"  standing      Manual Therapy   Manual Lymphatic Drainage (MLD) Reviewed having patient do self-manual lymph drainage with verbal and tactile cueing:  short neck, deep breathing, left axilla and anterior interaxillary anastomosis, right groin and axillo-inguinal anastomosis, and right UE from dorsal hand to shoulder, in supine; then therapist performed posterior interaxillary anastomosis and right axillo-inguinal anastomosis in left sidelying.                PT Education - 11/09/16 1600    Education provided Yes   Education Details self-manual lymph drainage, reviewed shoulder isometric flexion, extension, and er   Person(s) Educated Patient   Methods Explanation;Verbal cues   Comprehension Verbalized understanding;Returned demonstration                [facility] Goals - 11/09/16 1507      [place] Term Goal  #1   Title Patient will report at least 50% improvement in swelling in right upper quadrant   Baseline 10/14/16- pt reports no improvement in swelling in this area; same report on 10/30/16   Status On-going     CC Long Term Goal  #2   Title Patient will reports a 50% improvement in chest pain in area of mastectomy scar   Baseline 10/14/16- no improvement; 50-75% improved as of 11/09/16   Status Achieved     CC Long Term Goal  #3   Title Patient and spouse will be independent in self manual lymphatic drainage technique for long term management of edema   Status Achieved            Plan - 11/09/16 1600    Clinical Impression Statement Patient reported remembering the first few steps of manual lymph drainage but needing cueing for what followed, and that is what happened when we worked on this today.  She still benefitted from reminders to keep pressure light and to make movement slow, but she was able to perform this for herself.  She has met two of three goals. She is ready for discharge at next visit.   Rehab Potential Good   Clinical Impairments Affecting  Rehab Potential hx of radiation, chronic pain   PT Frequency 2x / week   PT Duration 4 weeks   PT Treatment/Interventions Manual lymph drainage;Manual techniques;Compression bandaging;Scar mobilization;Taping;Vasopneumatic Device;Therapeutic exercise;ADLs/Self Care Home Management;Patient/family education   PT Next Visit Plan discharge at next visit; review self-manual lymph drainage or perform it for her  PT Home Exercise Plan Self manual lymph drainage   Consulted and Agree with Plan of Care Patient      Patient will benefit from skilled therapeutic intervention in order to improve the following deficits and impairments:  Increased edema, Increased fascial restricitons  Visit Diagnosis: Postmastectomy lymphedema     Problem List Patient Active Problem List   Diagnosis Date Noted  . OA (osteoarthritis) of knee 11/03/2015  . Osteopenia 08/08/2015  . Chronic insomnia 08/08/2015  . Obstructive sleep apnea 06/25/2015  . Headache 02/03/2015  . Atypical chest pain 07/15/2014  . Arthralgia 02/22/2014  . Psoriasis 02/22/2014  . Breast cancer of lower-outer quadrant of right female breast ([place]) 08/09/2013  . Neck pain 06/22/2013  . Left knee pain 11/29/2012  . Gastroenteritis 04/26/2012  . Reactive depression (situational) 04/26/2012  . Right wrist pain 02/02/2012  . Right foot pain 10/04/2011  . Nevus 06/05/2011  . Status post total knee replacement 04/06/2011  . Overactive bladder 03/14/2011  . KNEE PAIN, LEFT 08/31/2010  . HEARING LOSS 08/17/2010  . HIRSUTISM 06/16/2009  . CYST, IDIOPATHIC 05/20/2008  . IRRITABLE BOWEL SYNDROME 04/15/2008  . Asthma 03/19/2008  . GERD 03/19/2008  . COLONIC POLYPS, HX OF 03/19/2008  . NEPHROLITHIASIS, HX OF 03/19/2008  . Hypothyroidism 03/18/2008  . Hyperlipidemia 03/18/2008  . INSOMNIA, CHRONIC 03/18/2008  . FIBROMYALGIA 03/18/2008  . Prediabetes 03/18/2008    S[place]IS[place],DONNA 11/09/2016, 4:04 PM  [place], [place], [address] Phone: [phone]   Fax:  [phone]  Name: Jean Nash MRN: [id number] Date of Birth: 1941/11/13   Serafina Royals, PT 11/09/16 4:04 PM

## 2016-11-11 ENCOUNTER — Ambulatory Visit: Payer: Medicare HMO

## 2016-11-11 DIAGNOSIS — I972 Postmastectomy lymphedema syndrome: Secondary | ICD-10-CM

## 2016-11-11 NOTE — Therapy (Signed)
Jean [place], [place], [address] Phone: [phone]   Fax:  [phone]  Physical Therapy Treatment  Patient Details  Name: Jean Nash MRN: [id number] Date of Birth: 1941-08-12 Referring Provider: Magrinat  Encounter Date: 11/11/2016      PT End of Session - 11/11/16 1153    Visit Number 13   Number of Visits 17   Date for PT Re-Evaluation 11/11/16   PT Start Time 1106   PT Stop Time 1152   PT Time Calculation (min) 46 min   Activity Tolerance Patient tolerated treatment well   Behavior During Therapy [facility] for tasks assessed/performed      Past Medical History:  Diagnosis Date  . Anxiety    PHOBIAS  . Arthritis   . Asthma    hx of -no inhalers, no problems  . Breast cancer ([place]) 08/08/13   right LOQ  . Cataract   . Chronic insomnia   . Cluster headaches    history of migraines / NONE FOR SEVERAL YRS  . Depression   . Fibromyalgia   . GERD (gastroesophageal reflux disease)   . H/O hiatal hernia   . History of colonic polyps   . History of transfusion   . Hx of radiation therapy 10/29/13- 12/14/13   right chest wall 5040 cGy 28 sessions, right supraclavicular/axillary region 5040 cGy 28 sessions, right chest wall boost 1000 cGy 5 sessions  . Hypothyroidism   . Irritable bowel syndrome   . Kidney stone   . Lymphedema    RT ARM - WEARS SLEEVE  . Macular degeneration   . Osteopenia   . Other abnormal glucose   . Other and unspecified hyperlipidemia   . Pain in joint, shoulder region   . Pneumonia 8335,8251  . Sleep apnea    USES C-PAP  . Stress incontinence, female     Past Surgical History:  Procedure Laterality Date  . ABDOMINAL HYSTERECTOMY    . APPENDECTOMY    . BILATERAL TOTAL MASTECTOMY WITH AXILLARY LYMPH NODE DISSECTION  08/30/2013   Dr Barry Dienes  . BREAST CYST ASPIRATION     9 cysts  . CATARACT EXTRACTION, BILATERAL    . CHOLECYSTECTOMY    . COLONOSCOPY    .  EVACUATION BREAST HEMATOMA Left 08/31/2013   Procedure: EVACUATION HEMATOMA BREAST;  Surgeon: Jean Nash;  Location: [place];  Service: General;  Laterality: Left;  . EYE SURGERY     to repair macular hole  . FOOT ARTHROPLASTY     lt   . GANGLION CYST EXCISION     rt foot  . HEMORRHOID SURGERY     03/1993  . JOINT REPLACEMENT  03/15/11   left knee replacement  . KNEE ARTHROSCOPY     > 10 years ago  . MASS EXCISION  11/04/2011   Procedure: EXCISION MASS;  Surgeon: Jean Nash;  Location: [place];  Service: Orthopedics;  Laterality: Right;  excisional biopsy right ulna mass  . MASTECTOMY W/ SENTINEL NODE BIOPSY Right 08/30/2013   Procedure: RIGHT  AXILLARY SENTINEL LYMPH NODE BIOPSY; Right Axillary Node Disection;  Surgeon: Jean Nash;  Location: [place];  Service: General;  Laterality: Right;  Right side nuc med 7:00   . PARTIAL KNEE ARTHROPLASTY Right 11/03/2015   Procedure: RIGHT KNEE MEDIAL UNICOMPARTMENTAL ARTHROPLASTY ;  Surgeon: Jean Nash;  [facility];  Service: Orthopedics;  Laterality: Right;  . SIMPLE MASTECTOMY WITH AXILLARY SENTINEL NODE  BIOPSY Left 08/30/2013   Procedure: Bilateral Breast Mastectomy ;  Surgeon: Jean Nash;  Location: [place];  Service: General;  Laterality: Left;  . skin tags removed     breast, panty line, neckline  . TOE SURGERY     preventative crossover toe surg/right foot  . TOE SURGERY     left foot/screw  in 2nd toe  . TONSILLECTOMY    . UPPER GASTROINTESTINAL ENDOSCOPY      There were no vitals filed for this visit.      Subjective Assessment - 11/11/16 1109    Subjective My knee still feels so much better since having the shot.    Pertinent History Right arm swelling began around end of March 2016 for unknown reasons.  She had a bilateral mastectomy with a right axillary node dissection removing 16 lymph nodes and 9 of those were positive.  Currently taking tamoxifen. , tendonitis right  knee   Patient Stated Goals get rid of swelling   Currently in Pain? No/denies                         [facility] Adult PT Treatment/Exercise - 11/11/16 0001      Manual Therapy   Manual Lymphatic Drainage (MLD) Reviewed having patient do self-manual lymph drainage with verbal and tactile cueing:  short neck, deep breathing, left axilla and anterior interaxillary anastomosis, right groin and axillo-inguinal anastomosis, and right UE from dorsal hand to shoulder, in supine; then therapist performed posterior interaxillary anastomosis and right axillo-inguinal anastomosis in left sidelying.                        [facility] Goals - 11/11/16 1159      CC Long Term Goal  #1   Title Patient will report at least 50% improvement in swelling in right upper quadrant   Baseline 10/14/16- pt reports no improvement in swelling in this area; same report on 10/30/16; pt reports 50% improvement and feels in better control of her symptoms now-11/11/16   Status Achieved     CC Long Term Goal  #2   Title Patient will reports a 50% improvement in chest pain in area of mastectomy scar   Baseline 10/14/16- no improvement; 50-75% improved as of 11/09/16   Status Achieved     CC Long Term Goal  #3   Title Patient and spouse will be independent in self manual lymphatic drainage technique for long term management of edema   Status Achieved            Plan - 11/11/16 1153    Clinical Impression Statement Pt did well with review of self manual lymph drainage today, just required some VC for lighter pressure but was able to correct this. She is pleased with her current functional status and ready to D/C.    Rehab Potential Good   Clinical Impairments Affecting Rehab Potential hx of radiation, chronic pain   PT Frequency 2x / week   PT Duration 4 weeks   PT Treatment/Interventions Manual lymph drainage;Manual techniques;Compression bandaging;Scar  mobilization;Taping;Vasopneumatic Device;Therapeutic exercise;ADLs/Self Care Home Management;Patient/family education   PT Next Visit Plan D/C this visit.    Consulted and Agree with Plan of Care Patient      Patient will benefit from skilled therapeutic intervention in order to improve the following deficits and impairments:  Increased edema, Increased fascial restricitons  Visit Diagnosis: Postmastectomy lymphedema  Problem List Patient Active Problem List   Diagnosis Date Noted  . OA (osteoarthritis) of knee 11/03/2015  . Osteopenia 08/08/2015  . Chronic insomnia 08/08/2015  . Obstructive sleep apnea 06/25/2015  . Headache 02/03/2015  . Atypical chest pain 07/15/2014  . Arthralgia 02/22/2014  . Psoriasis 02/22/2014  . Breast cancer of lower-outer quadrant of right female breast ([place]) 08/09/2013  . Neck pain 06/22/2013  . Left knee pain 11/29/2012  . Gastroenteritis 04/26/2012  . Reactive depression (situational) 04/26/2012  . Right wrist pain 02/02/2012  . Right foot pain 10/04/2011  . Nevus 06/05/2011  . Status post total knee replacement 04/06/2011  . Overactive bladder 03/14/2011  . KNEE PAIN, LEFT 08/31/2010  . HEARING LOSS 08/17/2010  . HIRSUTISM 06/16/2009  . CYST, IDIOPATHIC 05/20/2008  . IRRITABLE BOWEL SYNDROME 04/15/2008  . Asthma 03/19/2008  . GERD 03/19/2008  . COLONIC POLYPS, HX OF 03/19/2008  . NEPHROLITHIASIS, HX OF 03/19/2008  . Hypothyroidism 03/18/2008  . Hyperlipidemia 03/18/2008  . INSOMNIA, CHRONIC 03/18/2008  . FIBROMYALGIA 03/18/2008  . Prediabetes 03/18/2008    Otelia Limes, PTA 11/11/2016, 12:05 PM  [place], [place], [address] Phone: [phone]   Fax:  [phone]  Name: Lorielle Boehning MRN: [id number] Date of Birth: 07/03/1941  PHYSICAL THERAPY DISCHARGE SUMMARY  Visits from Start of Care: 13  Current functional level related to  goals / functional outcomes: Goals met as noted above.   Remaining deficits: Still reporting limitations on lymphedema life impact scale.   Education / Equipment: Self manual lymph drainage Plan: Patient agrees to discharge.  Patient goals were met. Patient is being discharged due to meeting the stated rehab goals.  ?????    Serafina Royals, PT 11/11/16 5:42 PM

## 2016-11-14 ENCOUNTER — Encounter: Payer: Self-pay | Admitting: Adult Health

## 2016-11-16 ENCOUNTER — Other Ambulatory Visit: Payer: Self-pay | Admitting: Adult Health

## 2016-11-17 ENCOUNTER — Telehealth: Payer: Self-pay

## 2016-11-17 ENCOUNTER — Other Ambulatory Visit: Payer: Self-pay | Admitting: Adult Health

## 2016-11-17 MED ORDER — BUTALBITAL-APAP-CAFFEINE 50-325-40 MG PO TABS: 1.0000 | ORAL_TABLET | Freq: Four times a day (QID) | ORAL | 3 refills | Status: AC | PRN

## 2016-11-17 MED ORDER — CITALOPRAM HYDROBROMIDE 20 MG PO TABS: 20.0000 mg | ORAL_TABLET | Freq: Every day | ORAL | 1 refills | Status: DC

## 2016-11-17 NOTE — Telephone Encounter (Signed)
Received PA request for Esgic 50-325-40 mg tablets from [facility]. PA submitted & is pending. Key: [name]

## 2016-11-18 NOTE — Telephone Encounter (Signed)
PA approved, form faxed back to pharmacy. 

## 2016-11-22 DIAGNOSIS — G4733 Obstructive sleep apnea (adult) (pediatric): Secondary | ICD-10-CM | POA: Diagnosis not present

## 2016-12-22 ENCOUNTER — Ambulatory Visit (INDEPENDENT_AMBULATORY_CARE_PROVIDER_SITE_OTHER): Payer: Medicare HMO | Admitting: Adult Health

## 2016-12-22 ENCOUNTER — Encounter: Payer: Self-pay | Admitting: Adult Health

## 2016-12-22 VITALS — BP 132/86 | Temp 98.2°F | Ht 60.0 in | Wt 186.2 lb

## 2016-12-22 DIAGNOSIS — R197 Diarrhea, unspecified: Secondary | ICD-10-CM

## 2016-12-22 NOTE — Progress Notes (Signed)
Subjective:    [name], female    DOB: [birth year], 76 y.o.   MRN: [id number]  diarrh   Diarrhea   This is a new problem. The current episode started today. The problem occurs 2 to 4 times per day. The problem has been gradually worsening. The stool consistency is described as watery. The patient states that diarrhea does not awaken her from sleep. Associated symptoms include sweats. Pertinent negatives include no abdominal pain, coughing, fever or vomiting. Nothing aggravates the symptoms. Risk factors include ill contacts (husband has same symptoms ). She has tried increased fluids for the symptoms. The treatment provided no relief.      Review of Systems  Constitutional: Positive for activity change, appetite change and fatigue. Negative for fever.  Respiratory: Negative for cough.   Gastrointestinal: Positive for diarrhea. Negative for abdominal distention, abdominal pain, blood in stool, nausea and vomiting.  Genitourinary: Negative.   Musculoskeletal: Negative.    Past Medical History:  Diagnosis Date  . Anxiety    PHOBIAS  . Arthritis   . Asthma    hx of -no inhalers, no problems  . Breast cancer ([place]) 08/08/13   right LOQ  . Cataract   . Chronic insomnia   . Cluster headaches    history of migraines / NONE FOR SEVERAL YRS  . Depression   . Fibromyalgia   . GERD (gastroesophageal reflux disease)   . H/O hiatal hernia   . History of colonic polyps   . History of transfusion   . Hx of radiation therapy 10/29/13- 12/14/13   right chest wall 5040 cGy 28 sessions, right supraclavicular/axillary region 5040 cGy 28 sessions, right chest wall boost 1000 cGy 5 sessions  . Hypothyroidism   . Irritable bowel syndrome   . Kidney stone   . Lymphedema    RT ARM - WEARS SLEEVE  . Macular degeneration   . Osteopenia   . Other abnormal glucose   . Other and unspecified hyperlipidemia   . Pain in joint, shoulder region   . Pneumonia [id number]  . Sleep apnea     USES C-PAP  . Stress incontinence, female     Social History   Social History  . Marital status: Married    Spouse name: N/A  . Number of children: 3  . Years of education: N/A   Occupational History  . retired Radiation protection practitioner    Social History Main Topics  . Smoking status: Former Smoker    Packs/day: 0.10    Years: 2.00    Types: Cigarettes    Start date: 11/16/1959    Quit date: 11/15/1960  . Smokeless tobacco: Never Used  . Alcohol use No  . Drug use: No  . Sexual activity: Not on file     Comment: menarche age 87, fist live birth 36, P 3, hysterectomy age 76, no HRT, BCP 2 yrs   Other Topics Concern  . Not on file   Social History Narrative   Occupation:  Retired Radiation protection practitioner    Married with 3 grown children      Never Smoked     Alcohol use-no         Past Surgical History:  Procedure Laterality Date  . ABDOMINAL HYSTERECTOMY    . APPENDECTOMY    . BILATERAL TOTAL MASTECTOMY WITH AXILLARY LYMPH NODE DISSECTION  08/30/2013   Dr Barry Dienes  . BREAST CYST ASPIRATION     9 cysts  . CATARACT EXTRACTION, BILATERAL    .  CHOLECYSTECTOMY    . COLONOSCOPY    . EVACUATION BREAST HEMATOMA Left 08/31/2013   Procedure: EVACUATION HEMATOMA BREAST;  Surgeon: Stark Klein, MD;  Location: [facility];  Service: General;  Laterality: Left;  . EYE SURGERY     to repair macular hole  . FOOT ARTHROPLASTY     lt   . GANGLION CYST EXCISION     rt foot  . HEMORRHOID SURGERY     03/1993  . JOINT REPLACEMENT  03/15/11   left knee replacement  . KNEE ARTHROSCOPY     > 10 years ago  . MASS EXCISION  11/04/2011   Procedure: EXCISION MASS;  Surgeon: Cammie Sickle., MD;  Location: [facility];  Service: Orthopedics;  Laterality: Right;  excisional biopsy right ulna mass  . MASTECTOMY W/ SENTINEL NODE BIOPSY Right 08/30/2013   Procedure: RIGHT  AXILLARY SENTINEL LYMPH NODE BIOPSY; Right Axillary Node Disection;  Surgeon: Stark Klein, MD;  Location: [facility];  Service: General;   Laterality: Right;  Right side nuc med 7:00   . PARTIAL KNEE ARTHROPLASTY Right 11/03/2015   Procedure: RIGHT KNEE MEDIAL UNICOMPARTMENTAL ARTHROPLASTY ;  Surgeon: Gaynelle Arabian, MD;  Location: [facility];  Service: Orthopedics;  Laterality: Right;  . SIMPLE MASTECTOMY WITH AXILLARY SENTINEL NODE BIOPSY Left 08/30/2013   Procedure: Bilateral Breast Mastectomy ;  Surgeon: Stark Klein, MD;  Location: [facility];  Service: General;  Laterality: Left;  . skin tags removed     breast, panty line, neckline  . TOE SURGERY     preventative crossover toe surg/right foot  . TOE SURGERY     left foot/screw  in 2nd toe  . TONSILLECTOMY    . UPPER GASTROINTESTINAL ENDOSCOPY      Family History  Problem Relation Age of Onset  . Stroke Mother     died age 34  . Diabetes Mother   . Breast cancer Mother 29  . Breast cancer Sister 36  . Breast cancer Paternal Aunt 27  . Diabetes Maternal Grandfather   . Breast cancer Paternal Grandmother 5  . Breast cancer Paternal Aunt     dx in her 48s  . Cancer Maternal Grandmother     intra-abdominal cancer  . Brain cancer Maternal Uncle 8  . Brain cancer Cousin 23    maternal cousin  . Brain cancer Cousin 20    paternal cousin    No Known Allergies  Current Outpatient Prescriptions on File Prior to Visit  Medication Sig Dispense Refill  . Biotin 10000 MCG TBDP Take by mouth.    . butalbital-acetaminophen-caffeine (FIORICET, ESGIC) 50-325-40 MG tablet Take 1-2 tablets by mouth every 6 (six) hours as needed for headache. 20 tablet 3  . Calcium Carb-Cholecalciferol (CALCIUM-VITAMIN D) 500-400 MG-UNIT TABS Take by mouth.    . cholecalciferol (VITAMIN D) 1000 units tablet Take 2,000 Units by mouth daily.    . citalopram (CELEXA) 20 MG tablet Take 1 tablet (20 mg total) by mouth daily. 90 tablet 1  . co-enzyme Q-10 30 MG capsule Take 100 mg by mouth 3 (three) times daily.    Marland Kitchen esomeprazole (NEXIUM) 40 MG capsule Take 1 capsule by mouth  daily 90 capsule 1  .  levothyroxine (SYNTHROID, LEVOTHROID) 100 MCG tablet Take 1 tablet (100 mcg total) by mouth daily before breakfast. 90 tablet 1  . LUTEIN PO Take by mouth.    . meloxicam (MOBIC) 7.5 MG tablet Take 1 tablet (7.5 mg total) by mouth daily. [place]  tablet 1  . methocarbamol (ROBAXIN) 500 MG tablet Take 500 mg by mouth every 6 (six) hours as needed for muscle spasms.    Marland Kitchen omega-3 acid ethyl esters (LOVAZA) 1 g capsule TAKE 1 CAPSULE TWICE DAILY 180 capsule 1  . polyethylene glycol (MIRALAX / GLYCOLAX) packet Take 17 g by mouth daily.    . pregabalin (LYRICA) 75 MG capsule Take 1 capsule (75 mg total) by mouth 2 (two) times daily. 180 capsule 1  . simvastatin (ZOCOR) 10 MG tablet Take 1 tablet (10 mg total) by mouth at bedtime. 90 tablet 1  . tamoxifen (NOLVADEX) 20 MG tablet Take 1 tablet (20 mg total) by mouth daily. 90 tablet 4  . traMADol (ULTRAM) 50 MG tablet Take 1-2 tablets (50-100 mg total) by mouth every 6 (six) hours as needed (mild pain). 80 tablet 1  . Wheat Dextrin (BENEFIBER PO) Take by mouth.     No current facility-administered medications on file prior to visit.     BP 132/86   Temp 98.2 F (36.8 C) (Oral)   Ht 5' (1.524 m)   Wt 186 lb 3.2 oz (84.5 kg)   BMI 36.36 kg/m       Objective:   Physical Exam  Constitutional: She appears well-developed and well-nourished. No distress.  Cardiovascular: Normal rate, regular rhythm, normal heart sounds and intact distal pulses.  Exam reveals no friction rub.   No murmur heard. Pulmonary/Chest: Effort normal and breath sounds normal. No respiratory distress. She has no wheezes. She has no rales. She exhibits no tenderness.  Abdominal: Soft. Normal appearance and bowel sounds are normal. She exhibits no distension and no mass. There is no tenderness. There is no rebound and no guarding.  Neurological: She is alert.  Skin: Skin is warm and dry. No rash noted. She is not diaphoretic. No erythema. No pallor.  Psychiatric: She has a normal  mood and affect. Her behavior is normal. Thought content normal.  Nursing note and vitals reviewed.     Assessment & Plan:  1. Diarrhea, unspecified type - Advised pepto bismol .  - BRAT diet - Tylenol/motrin for symptom relief - Follow up if no improvement  [name], NP

## 2016-12-27 ENCOUNTER — Encounter: Payer: Self-pay | Admitting: Adult Health

## 2016-12-27 ENCOUNTER — Encounter: Payer: Self-pay | Admitting: Gastroenterology

## 2016-12-28 ENCOUNTER — Encounter: Payer: Self-pay | Admitting: Adult Health

## 2016-12-29 DIAGNOSIS — G4733 Obstructive sleep apnea (adult) (pediatric): Secondary | ICD-10-CM | POA: Diagnosis not present

## 2017-01-03 ENCOUNTER — Ambulatory Visit: Payer: Medicare HMO | Admitting: Family Medicine

## 2017-01-04 ENCOUNTER — Encounter: Payer: Self-pay | Admitting: Adult Health

## 2017-01-06 ENCOUNTER — Other Ambulatory Visit: Payer: Self-pay | Admitting: Adult Health

## 2017-01-06 MED ORDER — OMEGA-3-ACID ETHYL ESTERS 1 G PO CAPS: 1.0000 | ORAL_CAPSULE | Freq: Two times a day (BID) | ORAL | 1 refills | Status: DC

## 2017-01-19 ENCOUNTER — Encounter: Payer: Self-pay | Admitting: Gastroenterology

## 2017-01-26 ENCOUNTER — Encounter: Payer: Self-pay | Admitting: Adult Health

## 2017-01-26 ENCOUNTER — Other Ambulatory Visit: Payer: Self-pay

## 2017-01-26 MED ORDER — ESOMEPRAZOLE MAGNESIUM 40 MG PO CPDR: DELAYED_RELEASE_CAPSULE | ORAL | 1 refills | Status: DC

## 2017-01-26 MED ORDER — SIMVASTATIN 10 MG PO TABS: 10.0000 mg | ORAL_TABLET | Freq: Every day | ORAL | 1 refills | Status: DC

## 2017-02-01 ENCOUNTER — Ambulatory Visit (INDEPENDENT_AMBULATORY_CARE_PROVIDER_SITE_OTHER): Payer: Medicare HMO | Admitting: Podiatry

## 2017-02-01 DIAGNOSIS — B351 Tinea unguium: Secondary | ICD-10-CM

## 2017-02-01 DIAGNOSIS — M79676 Pain in unspecified toe(s): Secondary | ICD-10-CM

## 2017-02-01 DIAGNOSIS — M722 Plantar fascial fibromatosis: Secondary | ICD-10-CM

## 2017-02-01 NOTE — Progress Notes (Signed)
She presents today chief complaint of painful elongated toenails bilaterally. She is also complaining of pain to the left heel.  Objective: Vital signs are stable to alert and oriented 3 pulses are palpable. She has been palpation even surgical the left heel. Tenderness along thick yellow dystrophic with mycotic and painful palpation. No open lesions or wounds are noted.  Assessment: Pain in limb secondary to onychomycosis and plantar fasciitis left.  Plan: I injected the left heel debridement and local anesthetic as performed previously. I also debrided her toenails 1 through 5 bilateral arms secondary to pain.

## 2017-03-01 ENCOUNTER — Encounter: Payer: Self-pay | Admitting: Adult Health

## 2017-03-01 MED ORDER — LEVOTHYROXINE SODIUM 100 MCG PO TABS: 100.0000 ug | ORAL_TABLET | Freq: Every day | ORAL | 1 refills | Status: DC

## 2017-03-01 NOTE — Telephone Encounter (Signed)
Refill on Levothyroxine sent to pharmacy.  Cory - Please advise on Citalopram dosing. Thanks!

## 2017-03-02 ENCOUNTER — Other Ambulatory Visit: Payer: Self-pay

## 2017-03-02 MED ORDER — LEVOTHYROXINE SODIUM 100 MCG PO TABS: 100.0000 ug | ORAL_TABLET | Freq: Every day | ORAL | 1 refills | Status: DC

## 2017-03-04 ENCOUNTER — Encounter: Payer: Self-pay | Admitting: Adult Health

## 2017-03-10 ENCOUNTER — Ambulatory Visit (AMBULATORY_SURGERY_CENTER): Payer: Self-pay

## 2017-03-10 ENCOUNTER — Encounter: Payer: Self-pay | Admitting: Gastroenterology

## 2017-03-10 VITALS — Ht 59.0 in | Wt 194.6 lb

## 2017-03-10 DIAGNOSIS — Z86010 Personal history of colonic polyps: Secondary | ICD-10-CM

## 2017-03-10 MED ORDER — NA SULFATE-K SULFATE-MG SULF 17.5-3.13-1.6 GM/177ML PO SOLN: ORAL | 0 refills | Status: DC

## 2017-03-10 NOTE — Progress Notes (Signed)
Per pt, no allergies to soy or egg products.Pt not taking any weight loss meds or using  O2 at home.   Pt refused Emmi video. 

## 2017-03-16 ENCOUNTER — Telehealth: Payer: Self-pay | Admitting: Gastroenterology

## 2017-03-16 ENCOUNTER — Encounter: Payer: Self-pay | Admitting: Adult Health

## 2017-03-16 NOTE — Telephone Encounter (Signed)
Spoke with patient and spouse. Ok to use the pink mouth wash. Answered all questions at this time.

## 2017-03-17 ENCOUNTER — Other Ambulatory Visit: Payer: Self-pay | Admitting: Adult Health

## 2017-03-24 ENCOUNTER — Encounter: Payer: Self-pay | Admitting: Gastroenterology

## 2017-03-24 ENCOUNTER — Ambulatory Visit (AMBULATORY_SURGERY_CENTER): Payer: Medicare HMO | Admitting: Gastroenterology

## 2017-03-24 VITALS — BP 134/76 | HR 60 | Temp 97.3°F | Resp 12 | Ht 59.0 in | Wt 194.0 lb

## 2017-03-24 DIAGNOSIS — Z86010 Personal history of colonic polyps: Secondary | ICD-10-CM | POA: Diagnosis not present

## 2017-03-24 DIAGNOSIS — D122 Benign neoplasm of ascending colon: Secondary | ICD-10-CM | POA: Diagnosis not present

## 2017-03-24 DIAGNOSIS — J45909 Unspecified asthma, uncomplicated: Secondary | ICD-10-CM | POA: Diagnosis not present

## 2017-03-24 DIAGNOSIS — K589 Irritable bowel syndrome without diarrhea: Secondary | ICD-10-CM | POA: Diagnosis not present

## 2017-03-24 DIAGNOSIS — K219 Gastro-esophageal reflux disease without esophagitis: Secondary | ICD-10-CM | POA: Diagnosis not present

## 2017-03-24 DIAGNOSIS — D124 Benign neoplasm of descending colon: Secondary | ICD-10-CM | POA: Diagnosis not present

## 2017-03-24 DIAGNOSIS — G4733 Obstructive sleep apnea (adult) (pediatric): Secondary | ICD-10-CM | POA: Diagnosis not present

## 2017-03-24 DIAGNOSIS — M797 Fibromyalgia: Secondary | ICD-10-CM | POA: Diagnosis not present

## 2017-03-24 MED ORDER — SODIUM CHLORIDE 0.9 % IV SOLN: 500.0000 mL | INTRAVENOUS | Status: DC

## 2017-03-24 NOTE — Progress Notes (Signed)
To PACU, vss patent aw report to rn 

## 2017-03-24 NOTE — Progress Notes (Signed)
Called to room to assist during endoscopic procedure.  Patient ID and intended procedure confirmed with present staff. Received instructions for my participation in the procedure from the performing physician.  

## 2017-03-24 NOTE — Op Note (Signed)
[place] Patient Name: Jean Nash Procedure Date: 03/24/2017 9:32 AM MRN: [id number] Endoscopist: Ladene Artist , MD Age: 76 Referring MD:  Date of Birth: [birth year] Gender: Female Account #: [id number] Procedure:                Colonoscopy Indications:              Surveillance: Personal history of adenomatous                            polyps on last colonoscopy 5 years ago Medicines:                Monitored Anesthesia Care Procedure:                Pre-Anesthesia Assessment:                           - Prior to the procedure, a History and Physical                            was performed, and patient medications and                            allergies were reviewed. The patient's tolerance of                            previous anesthesia was also reviewed. The risks                            and benefits of the procedure and the sedation                            options and risks were discussed with the patient.                            All questions were answered, and informed consent                            was obtained. Prior Anticoagulants: The patient has                            taken no previous anticoagulant or antiplatelet                            agents. ASA Grade Assessment: III - A patient with                            severe systemic disease. After reviewing the risks                            and benefits, the patient was deemed in                            satisfactory condition to undergo the procedure.  After obtaining informed consent, the colonoscope                            was passed under direct vision. Throughout the                            procedure, the patient's blood pressure, pulse, and                            oxygen saturations were monitored continuously. The                            Model PCF-H190DL [id number]) scope was introduced                            through the anus  and advanced to the the cecum,                            identified by appendiceal orifice and ileocecal                            valve. The ileocecal valve, appendiceal orifice,                            and rectum were photographed. The quality of the                            bowel preparation was excellent. The colonoscopy                            was performed without difficulty. The patient                            tolerated the procedure well. Scope In: 9:36:12 AM Scope Out: 9:50:41 AM Scope Withdrawal Time: 0 hours 8 minutes 54 seconds  Total Procedure Duration: 0 hours 14 minutes 29 seconds  Findings:                 The perianal and digital rectal examinations were                            normal.                           A 6 mm polyp was found in the descending colon. The                            polyp was sessile. The polyp was removed with a                            cold snare. Resection and retrieval were complete.                           Internal hemorrhoids were found during  retroflexion. The hemorrhoids were small and Grade                            I (internal hemorrhoids that do not prolapse).                           Many medium-mouthed diverticula were found in the                            sigmoid colon. There was narrowing of the colon in                            association with the diverticular opening. There                            was evidence of diverticular spasm. There was no                            evidence of diverticular bleeding.                           The exam was otherwise without abnormality on                            direct and retroflexion views. Complications:            No immediate complications. Estimated blood loss:                            None. Estimated Blood Loss:     Estimated blood loss: none. Impression:               - One 6 mm polyp in the descending colon, removed                             with a cold snare. Resected and retrieved.                           - Moderate diverticulosis in the sigmoid colon.                           - Small internal hemorrhoids.                           - Otherwise without abnormalities on direct and                            retroflexed views. Recommendation:           - Patient has a contact number available for                            emergencies. The signs and symptoms of potential                            delayed complications were discussed with  the                            patient. Return to normal activities tomorrow.                            Written discharge instructions were provided to the                            patient.                           - Resume previous diet.                           - Continue present medications.                           - Await pathology results.                           - No repeat colonoscopy due to age. Ladene Artist, MD 03/24/2017 10:00:24 AM This report has been signed electronically.

## 2017-03-24 NOTE — Patient Instructions (Signed)
YOU HAD AN ENDOSCOPIC PROCEDURE TODAY AT THE [facility]:   Refer to the procedure report that was given to you for any specific questions about what was found during the examination.  If the procedure report does not answer your questions, please call your gastroenterologist to clarify.  If you requested that your care partner not be given the details of your procedure findings, then the procedure report has been included in a sealed envelope for you to review at your convenience later.  YOU SHOULD EXPECT: Some feelings of bloating in the abdomen. Passage of more gas than usual.  Walking can help get rid of the air that was put into your GI tract during the procedure and reduce the bloating. If you had a lower endoscopy (such as a colonoscopy or flexible sigmoidoscopy) you may notice spotting of blood in your stool or on the toilet paper. If you underwent a bowel prep for your procedure, you may not have a normal bowel movement for a few days.  Please Note:  You might notice some irritation and congestion in your nose or some drainage.  This is from the oxygen used during your procedure.  There is no need for concern and it should clear up in a day or so.  SYMPTOMS TO REPORT IMMEDIATELY:   Following lower endoscopy (colonoscopy or flexible sigmoidoscopy):  Excessive amounts of blood in the stool  Significant tenderness or worsening of abdominal pains  Swelling of the abdomen that is new, acute  Fever of 100F or higher   For urgent or emergent issues, a gastroenterologist can be reached at any hour by calling [phone].   DIET:  We do recommend a small meal at first, but then you may proceed to your regular diet.  Drink plenty of fluids but you should avoid alcoholic beverages for 24 hours.  ACTIVITY:  You should plan to take it easy for the rest of today and you should NOT DRIVE or use heavy machinery until tomorrow (because of the sedation medicines used during the test).     FOLLOW UP: Our staff will call the number listed on your records the next business day following your procedure to check on you and address any questions or concerns that you may have regarding the information given to you following your procedure. If we do not reach you, we will leave a message.  However, if you are feeling well and you are not experiencing any problems, there is no need to return our call.  We will assume that you have returned to your regular daily activities without incident.  If any biopsies were taken you will be contacted by phone or by letter within the next 1-3 weeks.  Please call us at [phone] if you have not heard about the biopsies in 3 weeks.    SIGNATURES/CONFIDENTIALITY: You and/or your care partner have signed paperwork which will be entered into your electronic medical record.  These signatures attest to the fact that that the information above on your After Visit Summary has been reviewed and is understood.  Full responsibility of the confidentiality of this discharge information lies with you and/or your care-partner.  Thank you for letting us take care of your healthcare needs today. 

## 2017-03-25 ENCOUNTER — Telehealth: Payer: Self-pay | Admitting: *Deleted

## 2017-03-25 NOTE — Telephone Encounter (Signed)
  Follow up Call-  Call back number 03/24/2017  Post procedure Call Back phone  # [phone]  Permission to leave phone message Yes  Some recent data might be hidden     Patient questions:  Do you have a fever, pain , or abdominal swelling? No. Pain Score  0 *  Have you tolerated food without any problems? Yes.    Have you been able to return to your normal activities? Yes.    Do you have any questions about your discharge instructions: Diet   No. Medications  No. Follow up visit  No.  Do you have questions or concerns about your Care? No.  Actions: * If pain score is 4 or above: No action needed, pain <4.

## 2017-04-06 ENCOUNTER — Encounter: Payer: Self-pay | Admitting: Gastroenterology

## 2017-04-21 ENCOUNTER — Encounter: Payer: Self-pay | Admitting: Oncology

## 2017-04-28 ENCOUNTER — Telehealth: Payer: Self-pay | Admitting: Adult Health

## 2017-04-28 NOTE — Telephone Encounter (Signed)
Pt would like to know if she needs labs prior to her visit on 05/20/17

## 2017-04-28 NOTE — Telephone Encounter (Signed)
Will do labs at time of visit

## 2017-04-29 DIAGNOSIS — E119 Type 2 diabetes mellitus without complications: Secondary | ICD-10-CM | POA: Diagnosis not present

## 2017-04-29 LAB — HM DIABETES EYE EXAM

## 2017-04-29 NOTE — Telephone Encounter (Signed)
I contacted patient and patient's husband to let them know that our new policy is that we can only draw labs after cpe visit, incase there are any additional labs that need to added on. Patient then got upset and stated that they could not fast for that long. I advised that we could reschedule for a morning appt, that way it would be more convenient and patient stated "no, we are not waking up early to come in for an appt." I advised patient that we will need fasting labs for their appt's - and I was sorry for any inconvenience. Patient stated that she would like to get her husband scheduled sooner since he is having "a ton of issues that need to be discussed asap" and I advised patient that we can definitely schedule him sooner, but it Tommi Rumps may be unable to complete a physical if their is multiple acute issues going on. Patient's wife then got very loud and stated "will you just hurry up and schedule him and appt"  I was able to get patient's husband an appt scheduled 05/05/17 with Tommi Rumps. Patient then asked if I could schedule her an appt and when I asked her to verify her birthday so I could get her pulled up in the system, she then stated "just forget it, I have to go."

## 2017-05-03 ENCOUNTER — Ambulatory Visit: Payer: Medicare HMO | Admitting: Podiatry

## 2017-05-03 ENCOUNTER — Encounter: Payer: Self-pay | Admitting: Family Medicine

## 2017-05-06 ENCOUNTER — Encounter: Payer: Self-pay | Admitting: Family Medicine

## 2017-05-12 ENCOUNTER — Ambulatory Visit: Payer: Medicare HMO | Admitting: Adult Health

## 2017-05-12 ENCOUNTER — Encounter: Payer: Self-pay | Admitting: Oncology

## 2017-05-17 ENCOUNTER — Encounter: Payer: Self-pay | Admitting: Oncology

## 2017-05-20 ENCOUNTER — Ambulatory Visit: Payer: Medicare HMO | Admitting: Adult Health

## 2017-05-23 ENCOUNTER — Encounter: Payer: Self-pay | Admitting: Oncology

## 2017-05-26 DIAGNOSIS — L821 Other seborrheic keratosis: Secondary | ICD-10-CM | POA: Diagnosis not present

## 2017-05-26 DIAGNOSIS — D225 Melanocytic nevi of trunk: Secondary | ICD-10-CM | POA: Diagnosis not present

## 2017-05-26 DIAGNOSIS — W57XXXA Bitten or stung by nonvenomous insect and other nonvenomous arthropods, initial encounter: Secondary | ICD-10-CM | POA: Diagnosis not present

## 2017-05-26 DIAGNOSIS — L814 Other melanin hyperpigmentation: Secondary | ICD-10-CM | POA: Diagnosis not present

## 2017-05-26 DIAGNOSIS — D1801 Hemangioma of skin and subcutaneous tissue: Secondary | ICD-10-CM | POA: Diagnosis not present

## 2017-05-30 ENCOUNTER — Ambulatory Visit (INDEPENDENT_AMBULATORY_CARE_PROVIDER_SITE_OTHER)
Admission: RE | Admit: 2017-05-30 | Discharge: 2017-05-30 | Disposition: A | Discharge status: Home / Self Care | Payer: Medicare HMO | Source: Ambulatory Visit | Attending: Internal Medicine | Admitting: Internal Medicine

## 2017-05-30 ENCOUNTER — Ambulatory Visit (INDEPENDENT_AMBULATORY_CARE_PROVIDER_SITE_OTHER): Payer: Medicare HMO | Admitting: Internal Medicine

## 2017-05-30 ENCOUNTER — Encounter: Payer: Self-pay | Admitting: Internal Medicine

## 2017-05-30 VITALS — BP 136/80 | HR 69 | Ht 59.0 in | Wt 188.0 lb

## 2017-05-30 DIAGNOSIS — G4733 Obstructive sleep apnea (adult) (pediatric): Secondary | ICD-10-CM

## 2017-05-30 DIAGNOSIS — R079 Chest pain, unspecified: Secondary | ICD-10-CM | POA: Diagnosis not present

## 2017-05-30 DIAGNOSIS — J4520 Mild intermittent asthma, uncomplicated: Secondary | ICD-10-CM | POA: Diagnosis not present

## 2017-05-30 DIAGNOSIS — G4700 Insomnia, unspecified: Secondary | ICD-10-CM | POA: Diagnosis not present

## 2017-05-30 DIAGNOSIS — R059 Cough, unspecified: Secondary | ICD-10-CM

## 2017-05-30 DIAGNOSIS — R05 Cough: Secondary | ICD-10-CM

## 2017-05-30 NOTE — Progress Notes (Signed)
HPI female nonsmoker followed for OSA, insomnia, complicated by hypothyroid, asthma, GERD, psoriasis, CA L breast/ biMastectomy Unattended Home Sleep Test 03/06/2015- severe obstructive sleep apnea/hypopnea syndrome, AHI 44.9 per hour with desaturation to 74% and mean saturation only 89% on room air. Body weight 190 pounds   [facility] 4/24  -----------------------------------------------------------------------------------  01/26/2016-76 year old female nonsmoker followed for obstructive sleep apnea, insomnia, complicated by hypothyroid, asthma, GERD, psoriasis, CA L breast CPAP auto 8-15/Advanced  05/28/2016-76 year old female nonsmoker followed for OSA, insomnia, complicated by hypothyroid, asthma, GERD, psoriasis, CA L breast CPAP auto 8-15/Advanced            Husband here Tried Silenor for sleep instead of temazepam FOLLOW FOR: uses CPAP machine every night,  no concerns. Weight at time of sleep study 190 pounds. Weight now 199 We discussed function of CPAP humidifier. Download looks great with pressure control 8-15, good compliance. Husband says CPAP stops her snoring so he no longer needs earplugs. She feels she is sleeping well and not needing medication.  05/30/17- 76 year old female nonsmoker followed for OSA, insomnia, complicated by hypothyroid, asthma, GERD, psoriasis, CA L breast CPAP auto 8-15/Advanced            Husband here FOLLOWS FOR: Pt states she is wearing her CPAP at least 5 nights per week. Pt denies any issues with machine, mask or pressure.  Not a fan of CPAP but tries to use it for 4 hours. Dislikes her nasal mask is too bulky. Download 80% 4 hour compliance, AHI 1.6/hour. Never sleeps long intervals anyway. Has previously tried Silenor and temazepam. Complains of persistent dry cough for almost a month. Not much aware of postnasal drainage or reflux, no wheezing. Swims in pool for an hour a day.  ROS-see HPI   Negative unless "+" Constitutional:    weight loss, night  sweats, fevers, chills, fatigue, lassitude. HEENT:    headaches, difficulty swallowing, +tooth/dental problems, sore throat,       sneezing, itching, ear ache, nasal congestion, post nasal drip, snoring CV:    chest pain, orthopnea, PND, swelling in lower extremities, anasarca,                                                  dizziness, palpitations Resp:   +shortness of breath with exertion or at rest.                productive cough,  + non-productive cough, coughing up of blood.              change in color of mucus.  wheezing.   Skin:    rash or lesions. GI:     +heartburn, indigestion, abdominal pain, nausea, vomiting, diarrhea,                 change in bowel habits, loss of appetite GU: dysuria, change in color of urine, no urgency or frequency.   flank pain. MS:   +joint pain, stiffness, decreased range of motion, back pain. Neuro-     nothing unusual Psych:  change in mood or affect.  depression or anxiety.   memory loss.  OBJ- Physical Exam General- Alert, Oriented, Affect-appropriate, Distress- none acute, + obese Skin- rash-none, lesions- none, excoriation- none Lymphadenopathy- none Head- atraumatic            Eyes- Gross vision intact, PERRLA, conjunctivae and secretions  clear            Ears- Hearing, canals-normal            Nose- Clear, no-Septal dev, mucus, polyps, erosion, perforation             Throat- Mallampati III-IV , mucosa clear , drainage- none, tonsils- atrophic Neck- flexible , trachea midline, no stridor , thyroid nl, carotid no bruit Chest - symmetrical excursion , unlabored           Heart/CV- RRR , no murmur , no gallop  , no rub, nl s1 s2                           - JVD- none , edema- none, stasis changes- none, varices- none           Lung- clear to P&A, wheeze- none, cough + dry persistent , dullness-none, rub- none           Chest wall- + bilateral mastectomy Abd-  Br/ Gen/ Rectal- Not done, not indicated Extrem- cyanosis- none, clubbing, none,  atrophy- none, strength- nl Neuro- grossly intact to observation

## 2017-05-30 NOTE — Assessment & Plan Note (Signed)
Has tried Silenor and temazepam

## 2017-05-30 NOTE — Assessment & Plan Note (Signed)
Compliance is adequate and control was good on current settings with auto 8-15/Advanced. She doesn't like nasal mask on her face. I suggested she asked to look at nasal pillows designs.

## 2017-05-30 NOTE — Progress Notes (Signed)
Spoke with patient and informed her of results. She verbalized understanding and did not have any questions. Nothing further is needed.

## 2017-05-30 NOTE — Patient Instructions (Signed)
Order- CXR    Dx cough  Sample Breo Ellipta  100     Inhale 1 puff then rinse moth, once daily  We can continue CPAP auto 8-15, mask of choice, humidifier, supplies, AirView   Dx OSA  Please call  As needed

## 2017-05-30 NOTE — Assessment & Plan Note (Signed)
Not clear if dry cough is related to asthma. Plan-sample Breo 100, CXR

## 2017-05-31 ENCOUNTER — Ambulatory Visit (INDEPENDENT_AMBULATORY_CARE_PROVIDER_SITE_OTHER)
Admission: RE | Admit: 2017-05-31 | Discharge: 2017-05-31 | Disposition: A | Discharge status: Home / Self Care | Payer: Medicare HMO | Source: Ambulatory Visit | Attending: Adult Health | Admitting: Adult Health

## 2017-05-31 ENCOUNTER — Encounter: Payer: Self-pay | Admitting: Adult Health

## 2017-05-31 ENCOUNTER — Telehealth: Payer: Self-pay

## 2017-05-31 ENCOUNTER — Ambulatory Visit (INDEPENDENT_AMBULATORY_CARE_PROVIDER_SITE_OTHER): Payer: Medicare HMO | Admitting: Adult Health

## 2017-05-31 VITALS — BP 124/70 | HR 75 | Temp 98.2°F | Ht 59.0 in | Wt 186.6 lb

## 2017-05-31 DIAGNOSIS — M25531 Pain in right wrist: Secondary | ICD-10-CM

## 2017-05-31 DIAGNOSIS — S6991XA Unspecified injury of right wrist, hand and finger(s), initial encounter: Secondary | ICD-10-CM | POA: Diagnosis not present

## 2017-05-31 NOTE — Progress Notes (Signed)
Subjective:    [name], female    DOB: 03-27-1941, 76 y.o.   MRN: [id number]  HPI  76 year old female who  has a past medical history of Anxiety; Arthritis; Breast cancer ([place]) (08/08/13); Cataract; Chronic insomnia; Cluster headaches; Depression; Fibromyalgia; GERD (gastroesophageal reflux disease); H/O hiatal hernia; History of colonic polyps; History of transfusion (08/30/2013); radiation therapy (10/29/13- 12/14/13); Hypothyroidism; Irritable bowel syndrome; Kidney stone; Lymphedema; Macular degeneration; Osteopenia; Other abnormal glucose; Other and unspecified hyperlipidemia; Pain in joint, shoulder region; Pneumonia (9163,8466); Sleep apnea; and Stress incontinence, female.  She presents to the office today for an acute complaint of right wrist pain. She reports that earlier today she went to pick up her laptop computer and when she moved the computer she felt a pain in her wrist. She denies feeling a " popping sensation". She feels as though he grip strength has decreased. She has pain with ROM exercises but has no lack of ROM   She reports extensive wrist/hand surgery in the past  Review of Systems  Constitutional: Positive for activity change.  Musculoskeletal: Positive for arthralgias and myalgias.  Skin: Negative.   Psychiatric/Behavioral: Negative.   All other systems reviewed and are negative.    Past Medical History:  Diagnosis Date  . Anxiety    PHOBIAS  . Arthritis   . Breast cancer ([place]) 08/08/13   right LOQ  . Cataract   . Chronic insomnia   . Cluster headaches    history of migraines / NONE FOR SEVERAL YRS  . Depression   . Fibromyalgia   . GERD (gastroesophageal reflux disease)   . H/O hiatal hernia   . History of colonic polyps   . History of transfusion 08/30/2013  . Hx of radiation therapy 10/29/13- 12/14/13   right chest wall 5040 cGy 28 sessions, right supraclavicular/axillary region 5040 cGy 28 sessions, right chest wall boost 1000 cGy  5 sessions  . Hypothyroidism   . Irritable bowel syndrome   . Kidney stone   . Lymphedema    RT ARM - WEARS SLEEVE  . Macular degeneration    hole/right eye  . Osteopenia   . Other abnormal glucose   . Other and unspecified hyperlipidemia   . Pain in joint, shoulder region   . Pneumonia 5993,5701  . Sleep apnea    USES C-PAP  . Stress incontinence, female     Social History   Social History  . Marital status: Married    Spouse name: N/A  . Number of children: 3  . Years of education: N/A   Occupational History  . retired Radiation protection practitioner    Social History Main Topics  . Smoking status: Former Smoker    Packs/day: 0.10    Years: 2.00    Types: Cigarettes    Start date: 11/16/1959    Quit date: 11/15/1960  . Smokeless tobacco: Never Used  . Alcohol use No  . Drug use: No  . Sexual activity: Not on file     Comment: menarche age 36, fist live birth 23, P 3, hysterectomy age 42, no HRT, BCP 2 yrs   Other Topics Concern  . Not on file   Social History Narrative   Occupation:  Retired Radiation protection practitioner    Married with 3 grown children      Never Smoked     Alcohol use-no         Past Surgical History:  Procedure Laterality Date  . ABDOMINAL HYSTERECTOMY    .  APPENDECTOMY    . BILATERAL TOTAL MASTECTOMY WITH AXILLARY LYMPH NODE DISSECTION  08/30/2013   Dr Barry Dienes  . BREAST CYST ASPIRATION     9 cysts  . CATARACT EXTRACTION, BILATERAL  2005/2007  . CHOLECYSTECTOMY    . COLONOSCOPY    . EVACUATION BREAST HEMATOMA Left 08/31/2013   Procedure: EVACUATION HEMATOMA BREAST;  Surgeon: Stark Klein, MD;  Location: [place];  Service: General;  Laterality: Left;  . EYE SURGERY     to repair macular hole  . FOOT ARTHROPLASTY     lt   . GANGLION CYST EXCISION     rt foot  . HEMORRHOID SURGERY     03/1993  . JOINT REPLACEMENT  03/15/11   left knee replacement  . KNEE ARTHROSCOPY     /partial knee 2016/left knee 2012  . MASS EXCISION  11/04/2011   Procedure: EXCISION MASS;   Surgeon: Cammie Sickle., MD;  Location: [place];  Service: Orthopedics;  Laterality: Right;  excisional biopsy right ulna mass  . MASTECTOMY W/ SENTINEL NODE BIOPSY Right 08/30/2013   Procedure: RIGHT  AXILLARY SENTINEL LYMPH NODE BIOPSY; Right Axillary Node Disection;  Surgeon: Stark Klein, MD;  Location: [place];  Service: General;  Laterality: Right;  Right side nuc med 7:00   . PARTIAL KNEE ARTHROPLASTY Right 11/03/2015   Procedure: RIGHT KNEE MEDIAL UNICOMPARTMENTAL ARTHROPLASTY ;  Surgeon: Gaynelle Arabian, MD;  Location: [facility];  Service: Orthopedics;  Laterality: Right;  . SIMPLE MASTECTOMY WITH AXILLARY SENTINEL NODE BIOPSY Left 08/30/2013   Procedure: Bilateral Breast Mastectomy ;  Surgeon: Stark Klein, MD;  Location: [place];  Service: General;  Laterality: Left;  . skin tags removed     breast, panty line, neckline  . TOE SURGERY     preventative crossover toe surg/right foot  . TOE SURGERY  2009   left foot/screw  in 2nd toe  . TONSILLECTOMY    . UPPER GASTROINTESTINAL ENDOSCOPY      Family History  Problem Relation Age of Onset  . Stroke Mother        died age 44  . Diabetes Mother   . Breast cancer Mother 106  . Breast cancer Sister 40  . Breast cancer Paternal Aunt 60  . Diabetes Maternal Grandfather   . Breast cancer Paternal Grandmother 23  . Breast cancer Paternal Aunt        dx in her 60s  . Cancer Maternal Grandmother        intra-abdominal cancer  . Brain cancer Maternal Uncle 8  . Brain cancer Cousin 67       maternal cousin  . Brain cancer Cousin 20       paternal cousin  . Colon cancer Neg Hx     No Known Allergies  Current Outpatient Prescriptions on File Prior to Visit  Medication Sig Dispense Refill  . Biotin 10000 MCG TBDP Take by mouth.    . butalbital-acetaminophen-caffeine (FIORICET, ESGIC) 50-325-40 MG tablet Take 1-2 tablets by mouth every 6 (six) hours as needed for headache. (Patient taking differently: Take 1-2 tablets  by mouth as needed for headache. ) 20 tablet 3  . Calcium Carb-Cholecalciferol (CALCIUM-VITAMIN D) 500-400 MG-UNIT TABS Take by mouth 2 (two) times daily.     . cholecalciferol (VITAMIN D) 1000 units tablet Take 2,000 Units by mouth daily.    . citalopram (CELEXA) 20 MG tablet Take 20 mg by mouth daily.    Marland Kitchen co-enzyme Q-10 30 MG capsule  Take 300 mg by mouth daily.     . diclofenac sodium (VOLTAREN) 1 % GEL as needed.     Marland Kitchen esomeprazole (NEXIUM) 40 MG capsule Take 1 capsule by mouth  daily 90 capsule 1  . levothyroxine (SYNTHROID, LEVOTHROID) 100 MCG tablet Take 1 tablet (100 mcg total) by mouth daily before breakfast. (Patient taking differently: Take 100 mcg by mouth. Take one pill in middle of night) 90 tablet 1  . LUTEIN PO Take by mouth.    . Melatonin 3 MG CAPS Take by mouth at bedtime.    . meloxicam (MOBIC) 7.5 MG tablet Take 1 tablet (7.5 mg total) by mouth daily. 90 tablet 1  . methocarbamol (ROBAXIN) 500 MG tablet Take 500 mg by mouth every 6 (six) hours as needed for muscle spasms.    Marland Kitchen omega-3 acid ethyl esters (LOVAZA) 1 g capsule Take 1 capsule (1 g total) by mouth 2 (two) times daily. 180 capsule 1  . polyethylene glycol (MIRALAX / GLYCOLAX) packet Take 17 g by mouth daily.    . pregabalin (LYRICA) 75 MG capsule Take 1 capsule (75 mg total) by mouth 2 (two) times daily. (Patient taking differently: Take 75 mg by mouth daily. ) 180 capsule 1  . saccharomyces boulardii (FLORASTOR) 250 MG capsule Take 250 mg by mouth daily.    . simvastatin (ZOCOR) 10 MG tablet Take 1 tablet (10 mg total) by mouth at bedtime. 90 tablet 1  . tamoxifen (NOLVADEX) 20 MG tablet Take 1 tablet (20 mg total) by mouth daily. 90 tablet 4  . traMADol (ULTRAM) 50 MG tablet Take 1-2 tablets (50-100 mg total) by mouth every 6 (six) hours as needed (mild pain). 80 tablet 1   Current Facility-Administered Medications on File Prior to Visit  Medication Dose Route Frequency Provider Last Rate Last Dose  . 0.9 %   sodium chloride infusion  500 mL Intravenous Continuous Ladene Artist, MD        BP 124/70 (BP Location: Left Arm, Patient Position: Sitting, Cuff Size: Normal)   Pulse 75   Temp 98.2 F (36.8 C) (Oral)   Ht 4\' 11"  (1.499 m)   Wt 186 lb 9.6 oz (84.6 kg)   SpO2 95%   BMI 37.69 kg/m        Objective:   Physical Exam  Constitutional: She is oriented to person, place, and time.  Musculoskeletal: Normal range of motion. She exhibits tenderness. She exhibits no edema or deformity.  Tenderness along radial aspect of right wrist. No snuff box tenderness.  3/5 grip strength in right  5/5 grip strength in left  Good cap refill  No edema noted.  Normal distal pulses   Neurological: She is alert and oriented to person, place, and time.  Skin: Skin is warm and dry. No rash noted. No erythema. No pallor.  Psychiatric: She has a normal mood and affect. Her behavior is normal. Judgment and thought content normal.  Nursing note and vitals reviewed.     Assessment & Plan:  1. Right wrist pain - Likely stain injury. Will get x ray and MRI if needed - DG Wrist Complete Right; Future - Rest, ice, Mobic as prescribed - Can use wrist splint for stability   [name], NP

## 2017-05-31 NOTE — Telephone Encounter (Signed)
lvm for patient regarding her email for needed appt.  Will attempt to reach pt again on 06/01/17

## 2017-06-03 ENCOUNTER — Ambulatory Visit (INDEPENDENT_AMBULATORY_CARE_PROVIDER_SITE_OTHER): Payer: Medicare HMO | Admitting: Family Medicine

## 2017-06-03 ENCOUNTER — Encounter: Payer: Self-pay | Admitting: Family Medicine

## 2017-06-03 VITALS — BP 120/70 | HR 61 | Temp 98.5°F | Ht 59.0 in | Wt 186.9 lb

## 2017-06-03 DIAGNOSIS — R05 Cough: Secondary | ICD-10-CM | POA: Diagnosis not present

## 2017-06-03 DIAGNOSIS — R059 Cough, unspecified: Secondary | ICD-10-CM

## 2017-06-03 DIAGNOSIS — J4520 Mild intermittent asthma, uncomplicated: Secondary | ICD-10-CM

## 2017-06-03 DIAGNOSIS — J00 Acute nasopharyngitis [common cold]: Secondary | ICD-10-CM | POA: Diagnosis not present

## 2017-06-03 MED ORDER — BENZONATATE 100 MG PO CAPS: 100.0000 mg | ORAL_CAPSULE | Freq: Three times a day (TID) | ORAL | 0 refills | Status: DC | PRN

## 2017-06-03 NOTE — Progress Notes (Signed)
HPI:  Acute visit for upper resp symptoms: -started today, but milder symptoms for about 5 days -symptoms include sore throat, PND, tickle in throat, cough, ears pop and hurt at times -saw lung doctor a few days ago and CXR normal, inhaler has not helped -no SOB, wheezing, fevers, thick sputum -husband with similar symptoms -? Allergies, hx allergic asthma  ROS: See pertinent positives and negatives per HPI.  Past Medical History:  Diagnosis Date  . Anxiety    PHOBIAS  . Arthritis   . Breast cancer ([place]) 08/08/13   right LOQ  . Cataract   . Chronic insomnia   . Cluster headaches    history of migraines / NONE FOR SEVERAL YRS  . Depression   . Fibromyalgia   . GERD (gastroesophageal reflux disease)   . H/O hiatal hernia   . History of colonic polyps   . History of transfusion 08/30/2013  . Hx of radiation therapy 10/29/13- 12/14/13   right chest wall 5040 cGy 28 sessions, right supraclavicular/axillary region 5040 cGy 28 sessions, right chest wall boost 1000 cGy 5 sessions  . Hypothyroidism   . Irritable bowel syndrome   . Kidney stone   . Lymphedema    RT ARM - WEARS SLEEVE  . Macular degeneration    hole/right eye  . Osteopenia   . Other abnormal glucose   . Other and unspecified hyperlipidemia   . Pain in joint, shoulder region   . Pneumonia [id number]  . Sleep apnea    USES C-PAP  . Stress incontinence, female     Past Surgical History:  Procedure Laterality Date  . ABDOMINAL HYSTERECTOMY    . APPENDECTOMY    . BILATERAL TOTAL MASTECTOMY WITH AXILLARY LYMPH NODE DISSECTION  08/30/2013   Dr Barry Dienes  . BREAST CYST ASPIRATION     9 cysts  . CATARACT EXTRACTION, BILATERAL  2005/2007  . CHOLECYSTECTOMY    . COLONOSCOPY    . EVACUATION BREAST HEMATOMA Left 08/31/2013   Procedure: EVACUATION HEMATOMA BREAST;  Surgeon: Stark Klein, MD;  Location: [place];  Service: General;  Laterality: Left;  . EYE SURGERY     to repair macular hole  . FOOT ARTHROPLASTY     lt    . GANGLION CYST EXCISION     rt foot  . HEMORRHOID SURGERY     03/1993  . JOINT REPLACEMENT  03/15/11   left knee replacement  . KNEE ARTHROSCOPY     /partial knee 2016/left knee 2012  . MASS EXCISION  11/04/2011   Procedure: EXCISION MASS;  Surgeon: Cammie Sickle., MD;  Location: [place];  Service: Orthopedics;  Laterality: Right;  excisional biopsy right ulna mass  . MASTECTOMY W/ SENTINEL NODE BIOPSY Right 08/30/2013   Procedure: RIGHT  AXILLARY SENTINEL LYMPH NODE BIOPSY; Right Axillary Node Disection;  Surgeon: Stark Klein, MD;  Location: [place];  Service: General;  Laterality: Right;  Right side nuc med 7:00   . PARTIAL KNEE ARTHROPLASTY Right 11/03/2015   Procedure: RIGHT KNEE MEDIAL UNICOMPARTMENTAL ARTHROPLASTY ;  Surgeon: Gaynelle Arabian, MD;  Location[facility];  Service: Orthopedics;  Laterality: Right;  . SIMPLE MASTECTOMY WITH AXILLARY SENTINEL NODE BIOPSY Left 08/30/2013   Procedure: Bilateral Breast Mastectomy ;  Surgeon: Stark Klein, MD;  Location: [place];  Service: General;  Laterality: Left;  . skin tags removed     breast, panty line, neckline  . TOE SURGERY     preventative crossover toe surg/right foot  .  TOE SURGERY  2009   left foot/screw  in 2nd toe  . TONSILLECTOMY    . UPPER GASTROINTESTINAL ENDOSCOPY      Family History  Problem Relation Age of Onset  . Stroke Mother        died age 66  . Diabetes Mother   . Breast cancer Mother 12  . Breast cancer Sister 62  . Breast cancer Paternal Aunt 19  . Diabetes Maternal Grandfather   . Breast cancer Paternal Grandmother 19  . Breast cancer Paternal Aunt        dx in her 76s  . Cancer Maternal Grandmother        intra-abdominal cancer  . Brain cancer Maternal Uncle 8  . Brain cancer Cousin 30       maternal cousin  . Brain cancer Cousin 20       paternal cousin  . Colon cancer Neg Hx     Social History   Social History  . Marital status: Married    Spouse name: N/A  . Number  of children: 3  . Years of education: N/A   Occupational History  . retired Radiation protection practitioner    Social History Main Topics  . Smoking status: Former Smoker    Packs/day: 0.10    Years: 2.00    Types: Cigarettes    Start date: 11/16/1959    Quit date: 11/15/1960  . Smokeless tobacco: Never Used  . Alcohol use No  . Drug use: No  . Sexual activity: Not Asked     Comment: menarche age 62, fist live birth 60, P 3, hysterectomy age 35, no HRT, BCP 2 yrs   Other Topics Concern  . None   Social History Narrative   Occupation:  Retired Radiation protection practitioner    Married with 3 grown children      Never Smoked     Alcohol use-no          Current Outpatient Prescriptions:  .  Biotin 10000 MCG TBDP, Take by mouth., Disp: , Rfl:  .  butalbital-acetaminophen-caffeine (FIORICET, ESGIC) 50-325-40 MG tablet, Take 1-2 tablets by mouth every 6 (six) hours as needed for headache. (Patient taking differently: Take 1-2 tablets by mouth as needed for headache. ), Disp: 20 tablet, Rfl: 3 .  Calcium Carb-Cholecalciferol (CALCIUM-VITAMIN D) 500-400 MG-UNIT TABS, Take by mouth 2 (two) times daily. , Disp: , Rfl:  .  cholecalciferol (VITAMIN D) 1000 units tablet, Take 2,000 Units by mouth daily., Disp: , Rfl:  .  citalopram (CELEXA) 20 MG tablet, Take 20 mg by mouth daily., Disp: , Rfl:  .  co-enzyme Q-10 30 MG capsule, Take 300 mg by mouth daily. , Disp: , Rfl:  .  diclofenac sodium (VOLTAREN) 1 % GEL, as needed. , Disp: , Rfl:  .  esomeprazole (NEXIUM) 40 MG capsule, Take 1 capsule by mouth  daily, Disp: 90 capsule, Rfl: 1 .  levothyroxine (SYNTHROID, LEVOTHROID) 100 MCG tablet, Take 1 tablet (100 mcg total) by mouth daily before breakfast. (Patient taking differently: Take 100 mcg by mouth. Take one pill in middle of night), Disp: 90 tablet, Rfl: 1 .  LUTEIN PO, Take by mouth., Disp: , Rfl:  .  Melatonin 3 MG CAPS, Take by mouth at bedtime., Disp: , Rfl:  .  meloxicam (MOBIC) 7.5 MG tablet, Take 1 tablet (7.5 mg total)  by mouth daily., Disp: 90 tablet, Rfl: 1 .  methocarbamol (ROBAXIN) 500 MG tablet, Take 500 mg by mouth every 6 (six) hours  as needed for muscle spasms., Disp: , Rfl:  .  omega-3 acid ethyl esters (LOVAZA) 1 g capsule, Take 1 capsule (1 g total) by mouth 2 (two) times daily., Disp: 180 capsule, Rfl: 1 .  polyethylene glycol (MIRALAX / GLYCOLAX) packet, Take 17 g by mouth daily., Disp: , Rfl:  .  pregabalin (LYRICA) 75 MG capsule, Take 1 capsule (75 mg total) by mouth 2 (two) times daily. (Patient taking differently: Take 75 mg by mouth daily. ), Disp: 180 capsule, Rfl: 1 .  saccharomyces boulardii (FLORASTOR) 250 MG capsule, Take 250 mg by mouth daily., Disp: , Rfl:  .  simvastatin (ZOCOR) 10 MG tablet, Take 1 tablet (10 mg total) by mouth at bedtime., Disp: 90 tablet, Rfl: 1 .  tamoxifen (NOLVADEX) 20 MG tablet, Take 1 tablet (20 mg total) by mouth daily., Disp: 90 tablet, Rfl: 4 .  traMADol (ULTRAM) 50 MG tablet, Take 1-2 tablets (50-100 mg total) by mouth every 6 (six) hours as needed (mild pain)., Disp: 80 tablet, Rfl: 1 .  benzonatate (TESSALON PERLES) 100 MG capsule, Take 1 capsule (100 mg total) by mouth 3 (three) times daily as needed for cough., Disp: 20 capsule, Rfl: 0  Current Facility-Administered Medications:  .  0.9 %  sodium chloride infusion, 500 mL, Intravenous, Continuous, Ladene Artist, MD  EXAM:  Vitals:   06/03/17 1357  BP: 120/70  Pulse: 61  Temp: 98.5 F (36.9 C)    Body mass index is 37.75 kg/m.  GENERAL: vitals reviewed and listed above, alert, oriented, appears well hydrated and in no acute distress  HEENT: atraumatic, conjunttiva clear, no obvious abnormalities on inspection of external nose and ears, normal appearance of ear canals and TMs except for clear effusion R, clear nasal congestion, boggy turbinates mild post oropharyngeal erythema with PND, no tonsillar edema or exudate, no sinus TTP  NECK: no obvious masses on inspection  LUNGS: clear to  auscultation bilaterally, no wheezes, rales or rhonchi, good air movement  CV: HRRR, no peripheral edema  MS: moves all extremities without noticeable abnormality  PSYCH: pleasant and cooperative, no obvious depression or anxiety  ASSESSMENT AND PLAN:  Discussed the following assessment and plan:  Acute rhinitis  Cough  Allergic asthma, mild intermittent, uncomplicated  -we discussed possible serious and likely etiologies, workup and treatment, treatment risks and return precautions - I think this is many upper resp with VURI or allergic rhinitis most likely from exam -after this discussion, [place] opted for tessalon for cough, cont meds from pulm, antihistamine and flonase for the nose, PND and ETD -of course, we advised [place]  to return or notify a doctor immediately if symptoms worsen or persist or new concerns arise.   Patient Instructions  Start flonase 2 sprays each nostril daily for 1 month.  Tessalon as needed for cough per instructions.  Can try 1/2 tab of zyrtec nightly as weel.  I hope you are feeling better soon! See your doctor if worsening, new concerns or you are not improving with treatment.     Colin Benton R., DO

## 2017-06-03 NOTE — Patient Instructions (Signed)
Start flonase 2 sprays each nostril daily for 1 month.  Tessalon as needed for cough per instructions.  Can try 1/2 tab of zyrtec nightly as weel.  I hope you are feeling better soon! See your doctor if worsening, new concerns or you are not improving with treatment.

## 2017-06-06 ENCOUNTER — Encounter: Payer: Self-pay | Admitting: Family Medicine

## 2017-06-06 ENCOUNTER — Ambulatory Visit (INDEPENDENT_AMBULATORY_CARE_PROVIDER_SITE_OTHER): Payer: Medicare HMO | Admitting: Family Medicine

## 2017-06-06 VITALS — BP 102/62 | HR 76 | Temp 98.4°F | Wt 186.1 lb

## 2017-06-06 DIAGNOSIS — J209 Acute bronchitis, unspecified: Secondary | ICD-10-CM

## 2017-06-06 DIAGNOSIS — G4733 Obstructive sleep apnea (adult) (pediatric): Secondary | ICD-10-CM | POA: Diagnosis not present

## 2017-06-06 MED ORDER — HYDROCODONE-HOMATROPINE 5-1.5 MG/5ML PO SYRP: 5.0000 mL | ORAL_SOLUTION | Freq: Four times a day (QID) | ORAL | 0 refills | Status: DC | PRN

## 2017-06-06 NOTE — Progress Notes (Signed)
Subjective:     [name], female   DOB: 1941-02-27, 76 y.o.   MRN: [id number]  HPI Patient is a nonsmoker who is seen with persistent cough. She was seen here just last week and felt to have either viral URI with cough versus allergic cough. She has taken some Zyrtec, Flonase, and Tessalon without much improvement. She has not had any fever. Cough is dry. Minimal nasal congestion. Husband had similar symptoms couple weeks ago. She thinks she had some initial wheezing but none now. Cough is severe at times. Interfering with sleep. Not relieved with over-the-counter medications or Tessalon. Recent chest x-ray per pulmonary reviewed and normal  Past Medical History:  Diagnosis Date  . Anxiety    PHOBIAS  . Arthritis   . Breast cancer ([place]) 08/08/13   right LOQ  . Cataract   . Chronic insomnia   . Cluster headaches    history of migraines / NONE FOR SEVERAL YRS  . Depression   . Fibromyalgia   . GERD (gastroesophageal reflux disease)   . H/O hiatal hernia   . History of colonic polyps   . History of transfusion 08/30/2013  . Hx of radiation therapy 10/29/13- 12/14/13   right chest wall 5040 cGy 28 sessions, right supraclavicular/axillary region 5040 cGy 28 sessions, right chest wall boost 1000 cGy 5 sessions  . Hypothyroidism   . Irritable bowel syndrome   . Kidney stone   . Lymphedema    RT ARM - WEARS SLEEVE  . Macular degeneration    hole/right eye  . Osteopenia   . Other abnormal glucose   . Other and unspecified hyperlipidemia   . Pain in joint, shoulder region   . Pneumonia 0175,1025  . Sleep apnea    USES C-PAP  . Stress incontinence, female    Past Surgical History:  Procedure Laterality Date  . ABDOMINAL HYSTERECTOMY    . APPENDECTOMY    . BILATERAL TOTAL MASTECTOMY WITH AXILLARY LYMPH NODE DISSECTION  08/30/2013   Dr Barry Dienes  . BREAST CYST ASPIRATION     9 cysts  . CATARACT EXTRACTION, BILATERAL  2005/2007  . CHOLECYSTECTOMY    . COLONOSCOPY     . EVACUATION BREAST HEMATOMA Left 08/31/2013   Procedure: EVACUATION HEMATOMA BREAST;  Surgeon: Stark Klein, MD;  Location: [place];  Service: General;  Laterality: Left;  . EYE SURGERY     to repair macular hole  . FOOT ARTHROPLASTY     lt   . GANGLION CYST EXCISION     rt foot  . HEMORRHOID SURGERY     03/1993  . JOINT REPLACEMENT  03/15/11   left knee replacement  . KNEE ARTHROSCOPY     /partial knee 2016/left knee 2012  . MASS EXCISION  11/04/2011   Procedure: EXCISION MASS;  Surgeon: Cammie Sickle., MD;  Location: [place];  Service: Orthopedics;  Laterality: Right;  excisional biopsy right ulna mass  . MASTECTOMY W/ SENTINEL NODE BIOPSY Right 08/30/2013   Procedure: RIGHT  AXILLARY SENTINEL LYMPH NODE BIOPSY; Right Axillary Node Disection;  Surgeon: Stark Klein, MD;  Location: [place];  Service: General;  Laterality: Right;  Right side nuc med 7:00   . PARTIAL KNEE ARTHROPLASTY Right 11/03/2015   Procedure: RIGHT KNEE MEDIAL UNICOMPARTMENTAL ARTHROPLASTY ;  Surgeon: Gaynelle Arabian, MD;  Location: [facility];  Service: Orthopedics;  Laterality: Right;  . SIMPLE MASTECTOMY WITH AXILLARY SENTINEL NODE BIOPSY Left 08/30/2013   Procedure: Bilateral Breast Mastectomy ;  Surgeon: Stark Klein, MD;  Location: [place];  Service: General;  Laterality: Left;  . skin tags removed     breast, panty line, neckline  . TOE SURGERY     preventative crossover toe surg/right foot  . TOE SURGERY  2009   left foot/screw  in 2nd toe  . TONSILLECTOMY    . UPPER GASTROINTESTINAL ENDOSCOPY      reports that she quit smoking about 56 years ago. Her smoking use included Cigarettes. She started smoking about 57 years ago. She has a 0.20 pack-year smoking history. She has never used smokeless tobacco. She reports that she does not drink alcohol or use drugs. family history includes Brain cancer (age of onset: 26) in her cousin; Brain cancer (age of onset: 31) in her cousin; Brain cancer (age  of onset: 108) in her maternal uncle; Breast cancer in her paternal aunt; Breast cancer (age of onset: 35) in her paternal aunt and paternal grandmother; Breast cancer (age of onset: 69) in her sister; Breast cancer (age of onset: 58) in her mother; Cancer in her maternal grandmother; Diabetes in her maternal grandfather and mother; Stroke in her mother. No Known Allergies   Review of Systems  Constitutional: Negative for chills and fever.  Respiratory: Positive for cough. Negative for shortness of breath and wheezing.   Cardiovascular: Negative for chest pain.       Objective:   Physical Exam  Constitutional: She appears well-developed and well-nourished.  HENT:  Right Ear: External ear normal.  Left Ear: External ear normal.  Mouth/Throat: Oropharynx is clear and moist.  Neck: Neck supple.  Cardiovascular: Normal rate and regular rhythm.   Pulmonary/Chest: Effort normal and breath sounds normal. No respiratory distress. She has no wheezes. She has no rales.  Lymphadenopathy:    She has no cervical adenopathy.       Assessment:     Cough. Suspect acute viral bronchitis. Nonfocal exam    Plan:     -Hycodan cough syrup 1 teaspoon every 6 hours when necessary for severe cough. Cautioned about potential sedation and constipation with this -Follow-up promptly for any fever, dyspnea, or other concerns  Eulas Post MD [facility]

## 2017-06-06 NOTE — Patient Instructions (Signed)

## 2017-06-10 ENCOUNTER — Ambulatory Visit (INDEPENDENT_AMBULATORY_CARE_PROVIDER_SITE_OTHER): Payer: Medicare HMO | Admitting: Family Medicine

## 2017-06-10 ENCOUNTER — Encounter: Payer: Self-pay | Admitting: Family Medicine

## 2017-06-10 VITALS — BP 108/70 | HR 76 | Temp 98.5°F | Wt 186.5 lb

## 2017-06-10 DIAGNOSIS — R05 Cough: Secondary | ICD-10-CM | POA: Diagnosis not present

## 2017-06-10 DIAGNOSIS — R059 Cough, unspecified: Secondary | ICD-10-CM

## 2017-06-10 MED ORDER — HYDROCODONE-HOMATROPINE 5-1.5 MG/5ML PO SYRP: 5.0000 mL | ORAL_SOLUTION | Freq: Four times a day (QID) | ORAL | 0 refills | Status: AC | PRN

## 2017-06-10 NOTE — Patient Instructions (Signed)
Follow up for any fever or increased shortness of breath. 

## 2017-06-10 NOTE — Progress Notes (Signed)
Subjective:     [name], female   DOB: 1941-08-30, 76 y.o.   MRN: [id number]  HPI Patient seen with persistent cough. Really no change in symptoms since last visit. We prescribed Hycodan cough syrup which does help her cough. She started off taking this every 6 hours and now using mostly at night. No hemoptysis. No fevers. No chills. No dyspnea. No clear triggers for cough.  Past Medical History:  Diagnosis Date  . Anxiety    PHOBIAS  . Arthritis   . Breast cancer ([place]) 08/08/13   right LOQ  . Cataract   . Chronic insomnia   . Cluster headaches    history of migraines / NONE FOR SEVERAL YRS  . Depression   . Fibromyalgia   . GERD (gastroesophageal reflux disease)   . H/O hiatal hernia   . History of colonic polyps   . History of transfusion 08/30/2013  . Hx of radiation therapy 10/29/13- 12/14/13   right chest wall 5040 cGy 28 sessions, right supraclavicular/axillary region 5040 cGy 28 sessions, right chest wall boost 1000 cGy 5 sessions  . Hypothyroidism   . Irritable bowel syndrome   . Kidney stone   . Lymphedema    RT ARM - WEARS SLEEVE  . Macular degeneration    hole/right eye  . Osteopenia   . Other abnormal glucose   . Other and unspecified hyperlipidemia   . Pain in joint, shoulder region   . Pneumonia 6237,6283  . Sleep apnea    USES C-PAP  . Stress incontinence, female    Past Surgical History:  Procedure Laterality Date  . ABDOMINAL HYSTERECTOMY    . APPENDECTOMY    . BILATERAL TOTAL MASTECTOMY WITH AXILLARY LYMPH NODE DISSECTION  08/30/2013   Dr Barry Dienes  . BREAST CYST ASPIRATION     9 cysts  . CATARACT EXTRACTION, BILATERAL  2005/2007  . CHOLECYSTECTOMY    . COLONOSCOPY    . EVACUATION BREAST HEMATOMA Left 08/31/2013   Procedure: EVACUATION HEMATOMA BREAST;  Surgeon: Stark Klein, MD;  Location: [place];  Service: General;  Laterality: Left;  . EYE SURGERY     to repair macular hole  . FOOT ARTHROPLASTY     lt   . GANGLION CYST  EXCISION     rt foot  . HEMORRHOID SURGERY     03/1993  . JOINT REPLACEMENT  03/15/11   left knee replacement  . KNEE ARTHROSCOPY     /partial knee 2016/left knee 2012  . MASS EXCISION  11/04/2011   Procedure: EXCISION MASS;  Surgeon: Cammie Sickle., MD;  Location: [place];  Service: Orthopedics;  Laterality: Right;  excisional biopsy right ulna mass  . MASTECTOMY W/ SENTINEL NODE BIOPSY Right 08/30/2013   Procedure: RIGHT  AXILLARY SENTINEL LYMPH NODE BIOPSY; Right Axillary Node Disection;  Surgeon: Stark Klein, MD;  Location: [facility];  Service: General;  Laterality: Right;  Right side nuc med 7:00   . PARTIAL KNEE ARTHROPLASTY Right 11/03/2015   Procedure: RIGHT KNEE MEDIAL UNICOMPARTMENTAL ARTHROPLASTY ;  Surgeon: Gaynelle Arabian, MD;  [facility];  Service: Orthopedics;  Laterality: Right;  . SIMPLE MASTECTOMY WITH AXILLARY SENTINEL NODE BIOPSY Left 08/30/2013   Procedure: Bilateral Breast Mastectomy ;  Surgeon: Stark Klein, MD;  Location: [place];  Service: General;  Laterality: Left;  . skin tags removed     breast, panty line, neckline  . TOE SURGERY     preventative crossover toe surg/right foot  .  TOE SURGERY  2009   left foot/screw  in 2nd toe  . TONSILLECTOMY    . UPPER GASTROINTESTINAL ENDOSCOPY      reports that she quit smoking about 56 years ago. Her smoking use included Cigarettes. She started smoking about 57 years ago. She has a 0.20 pack-year smoking history. She has never used smokeless tobacco. She reports that she does not drink alcohol or use drugs. family history includes Brain cancer (age of onset: 75) in her cousin; Brain cancer (age of onset: 80) in her cousin; Brain cancer (age of onset: 59) in her maternal uncle; Breast cancer in her paternal aunt; Breast cancer (age of onset: 69) in her paternal aunt and paternal grandmother; Breast cancer (age of onset: 77) in her sister; Breast cancer (age of onset: 25) in her mother; Cancer in her  maternal grandmother; Diabetes in her maternal grandfather and mother; Stroke in her mother. No Known Allergies]   Review of Systems  Constitutional: Negative for chills and fever.  Respiratory: Positive for cough. Negative for shortness of breath and wheezing.   Cardiovascular: Negative for chest pain.       Objective:   Physical Exam  Constitutional: She appears well-developed and well-nourished.  HENT:  Right Ear: External ear normal.  Left Ear: External ear normal.  Mouth/Throat: Oropharynx is clear and moist.  Cardiovascular: Normal rate and regular rhythm.   Pulmonary/Chest: Effort normal and breath sounds normal. No respiratory distress. She has no wheezes. She has no rales.       Assessment:     Cough. Exam remains nonfocal. She does not have any fever or other indicators of pneumonia. Suspect viral. No respiratory distress    Plan:     -Try taking Hycodan cough syrup only at night for severe cough. 1 refill given of 100 mL 1 teaspoon daily at bedtime when necessary -Follow-up immediately for any fever or increased shortness of breath  Eulas Post MD [facility]

## 2017-06-14 ENCOUNTER — Ambulatory Visit (INDEPENDENT_AMBULATORY_CARE_PROVIDER_SITE_OTHER): Payer: Medicare HMO | Admitting: Adult Health

## 2017-06-14 ENCOUNTER — Encounter: Payer: Self-pay | Admitting: Adult Health

## 2017-06-14 VITALS — BP 134/72 | HR 69 | Temp 98.3°F | Wt 187.0 lb

## 2017-06-14 DIAGNOSIS — R7303 Prediabetes: Secondary | ICD-10-CM

## 2017-06-14 DIAGNOSIS — J069 Acute upper respiratory infection, unspecified: Secondary | ICD-10-CM | POA: Diagnosis not present

## 2017-06-14 LAB — POCT GLYCOSYLATED HEMOGLOBIN (HGB A1C): Hemoglobin A1C: 5.8

## 2017-06-14 MED ORDER — PREDNISONE 10 MG PO TABS: ORAL_TABLET | ORAL | 0 refills | Status: DC

## 2017-06-14 NOTE — Progress Notes (Signed)
Subjective:    [name], female    DOB: [birth year], 76 y.o.   MRN: [id number]  HPI  76 year old female who  has a past medical history of Anxiety; Arthritis; Breast cancer ([place]) (08/08/13); Cataract; Chronic insomnia; Cluster headaches; Depression; Fibromyalgia; GERD (gastroesophageal reflux disease); H/O hiatal hernia; History of colonic polyps; History of transfusion (08/30/2013); radiation therapy (10/29/13- 12/14/13); Hypothyroidism; Irritable bowel syndrome; Kidney stone; Lymphedema; Macular degeneration; Osteopenia; Other abnormal glucose; Other and unspecified hyperlipidemia; Pain in joint, shoulder region; Pneumonia (4268,3419); Sleep apnea; and Stress incontinence, female.  She presents to the office today for continued cough. She has been seen by two other providers in this office for the same issue. She first saw a provider on 06/03/2017 and was advised to use Flonase, Zyrtec  and Tessalon without resolving. She was then seen 3 days later and prescribed Hycodan which she reports helped with her cough.   Was also seen by Pulmonary before originally presenting to this office where she had a chest xray which was negative.   Today in the office she reports that she feels as though the cough is improving slightly but that today in the office she reports her throat has become sore and she wants to make sure she does not have strep. She also reports that she feels as though she is wheezing more at night.   She has been monitoring her blood sugars at home and she has had readings of 120-130's at home. Her last A1c was in October and it was 6.2. She would like to recheck today   She denies any fevers n/v/d   Review of Systems  Constitutional: Positive for activity change and fatigue. Negative for chills and fever.  HENT: Negative.   Respiratory: Positive for chest tightness, shortness of breath and wheezing.   Cardiovascular: Negative.   All other systems reviewed and are  negative.  See HPI  Past Medical History:  Diagnosis Date  . Anxiety    PHOBIAS  . Arthritis   . Breast cancer ([place]) 08/08/13   right LOQ  . Cataract   . Chronic insomnia   . Cluster headaches    history of migraines / NONE FOR SEVERAL YRS  . Depression   . Fibromyalgia   . GERD (gastroesophageal reflux disease)   . H/O hiatal hernia   . History of colonic polyps   . History of transfusion 08/30/2013  . Hx of radiation therapy 10/29/13- 12/14/13   right chest wall 5040 cGy 28 sessions, right supraclavicular/axillary region 5040 cGy 28 sessions, right chest wall boost 1000 cGy 5 sessions  . Hypothyroidism   . Irritable bowel syndrome   . Kidney stone   . Lymphedema    RT ARM - WEARS SLEEVE  . Macular degeneration    hole/right eye  . Osteopenia   . Other abnormal glucose   . Other and unspecified hyperlipidemia   . Pain in joint, shoulder region   . Pneumonia 6222,9798  . Sleep apnea    USES C-PAP  . Stress incontinence, female     Social History   Social History  . Marital status: Married    Spouse name: N/A  . Number of children: 3  . Years of education: N/A   Occupational History  . retired Radiation protection practitioner    Social History Main Topics  . Smoking status: Former Smoker    Packs/day: 0.10    Years: 2.00    Types: Cigarettes    Start date:  11/16/1959    Quit date: 11/15/1960  . Smokeless tobacco: Never Used  . Alcohol use No  . Drug use: No  . Sexual activity: Not on file     Comment: menarche age 19, fist live birth 64, P 3, hysterectomy age 50, no HRT, BCP 2 yrs   Other Topics Concern  . Not on file   Social History Narrative   Occupation:  Retired Radiation protection practitioner    Married with 3 grown children      Never Smoked     Alcohol use-no         Past Surgical History:  Procedure Laterality Date  . ABDOMINAL HYSTERECTOMY    . APPENDECTOMY    . BILATERAL TOTAL MASTECTOMY WITH AXILLARY LYMPH NODE DISSECTION  08/30/2013   Dr Barry Dienes  . BREAST CYST ASPIRATION       9 cysts  . CATARACT EXTRACTION, BILATERAL  2005/2007  . CHOLECYSTECTOMY    . COLONOSCOPY    . EVACUATION BREAST HEMATOMA Left 08/31/2013   Procedure: EVACUATION HEMATOMA BREAST;  Surgeon: Stark Klein, MD;  Location: [place];  Service: General;  Laterality: Left;  . EYE SURGERY     to repair macular hole  . FOOT ARTHROPLASTY     lt   . GANGLION CYST EXCISION     rt foot  . HEMORRHOID SURGERY     03/1993  . JOINT REPLACEMENT  03/15/11   left knee replacement  . KNEE ARTHROSCOPY     /partial knee 2016/left knee 2012  . MASS EXCISION  11/04/2011   Procedure: EXCISION MASS;  Surgeon: Cammie Sickle., MD;  Location: [place];  Service: Orthopedics;  Laterality: Right;  excisional biopsy right ulna mass  . MASTECTOMY W/ SENTINEL NODE BIOPSY Right 08/30/2013   Procedure: RIGHT  AXILLARY SENTINEL LYMPH NODE BIOPSY; Right Axillary Node Disection;  Surgeon: Stark Klein, MD;  Location: [place];  Service: General;  Laterality: Right;  Right side nuc med 7:00   . PARTIAL KNEE ARTHROPLASTY Right 11/03/2015   Procedure: RIGHT KNEE MEDIAL UNICOMPARTMENTAL ARTHROPLASTY ;  Surgeon: Gaynelle Arabian, MD;  [facility];  Service: Orthopedics;  Laterality: Right;  . SIMPLE MASTECTOMY WITH AXILLARY SENTINEL NODE BIOPSY Left 08/30/2013   Procedure: Bilateral Breast Mastectomy ;  Surgeon: Stark Klein, MD;  Location: [place];  Service: General;  Laterality: Left;  . skin tags removed     breast, panty line, neckline  . TOE SURGERY     preventative crossover toe surg/right foot  . TOE SURGERY  2009   left foot/screw  in 2nd toe  . TONSILLECTOMY    . UPPER GASTROINTESTINAL ENDOSCOPY      Family History  Problem Relation Age of Onset  . Stroke Mother        died age 13  . Diabetes Mother   . Breast cancer Mother 45  . Breast cancer Sister 51  . Breast cancer Paternal Aunt 89  . Diabetes Maternal Grandfather   . Breast cancer Paternal Grandmother 40  . Breast cancer Paternal Aunt         dx in her 10s  . Cancer Maternal Grandmother        intra-abdominal cancer  . Brain cancer Maternal Uncle 8  . Brain cancer Cousin 38       maternal cousin  . Brain cancer Cousin 20       paternal cousin  . Colon cancer Neg Hx     No Known Allergies  Current Outpatient Prescriptions on File Prior to Visit  Medication Sig Dispense Refill  . Biotin 10000 MCG TBDP Take by mouth.    . butalbital-acetaminophen-caffeine (FIORICET, ESGIC) 50-325-40 MG tablet Take 1-2 tablets by mouth every 6 (six) hours as needed for headache. (Patient taking differently: Take 1-2 tablets by mouth as needed for headache. ) 20 tablet 3  . Calcium Carb-Cholecalciferol (CALCIUM-VITAMIN D) 500-400 MG-UNIT TABS Take by mouth 2 (two) times daily.     . cholecalciferol (VITAMIN D) 1000 units tablet Take 2,000 Units by mouth daily.    . citalopram (CELEXA) 20 MG tablet Take 20 mg by mouth daily.    Marland Kitchen co-enzyme Q-10 30 MG capsule Take 300 mg by mouth daily.     . diclofenac sodium (VOLTAREN) 1 % GEL as needed.     Marland Kitchen esomeprazole (NEXIUM) 40 MG capsule Take 1 capsule by mouth  daily 90 capsule 1  . HYDROcodone-homatropine (HYCODAN) 5-1.5 MG/5ML syrup Take 5 mLs by mouth every 6 (six) hours as needed. 100 mL 0  . levothyroxine (SYNTHROID, LEVOTHROID) 100 MCG tablet Take 1 tablet (100 mcg total) by mouth daily before breakfast. (Patient taking differently: Take 100 mcg by mouth. Take one pill in middle of night) 90 tablet 1  . LUTEIN PO Take by mouth.    . Melatonin 3 MG CAPS Take by mouth at bedtime.    . meloxicam (MOBIC) 7.5 MG tablet Take 1 tablet (7.5 mg total) by mouth daily. 90 tablet 1  . methocarbamol (ROBAXIN) 500 MG tablet Take 500 mg by mouth every 6 (six) hours as needed for muscle spasms.    Marland Kitchen omega-3 acid ethyl esters (LOVAZA) 1 g capsule Take 1 capsule (1 g total) by mouth 2 (two) times daily. 180 capsule 1  . polyethylene glycol (MIRALAX / GLYCOLAX) packet Take 17 g by mouth daily.    .  pregabalin (LYRICA) 75 MG capsule Take 1 capsule (75 mg total) by mouth 2 (two) times daily. (Patient taking differently: Take 75 mg by mouth daily. ) 180 capsule 1  . saccharomyces boulardii (FLORASTOR) 250 MG capsule Take 250 mg by mouth daily.    . simvastatin (ZOCOR) 10 MG tablet Take 1 tablet (10 mg total) by mouth at bedtime. 90 tablet 1  . tamoxifen (NOLVADEX) 20 MG tablet Take 1 tablet (20 mg total) by mouth daily. 90 tablet 4  . traMADol (ULTRAM) 50 MG tablet Take 1-2 tablets (50-100 mg total) by mouth every 6 (six) hours as needed (mild pain). 80 tablet 1  . benzonatate (TESSALON PERLES) 100 MG capsule Take 1 capsule (100 mg total) by mouth 3 (three) times daily as needed for cough. (Patient not taking: Reported on 06/14/2017) 20 capsule 0   Current Facility-Administered Medications on File Prior to Visit  Medication Dose Route Frequency Provider Last Rate Last Dose  . 0.9 %  sodium chloride infusion  500 mL Intravenous Continuous Ladene Artist, MD        BP 134/72 (BP Location: Left Arm)   Pulse 69   Temp 98.3 F (36.8 C) (Oral)   Wt 187 lb (84.8 kg)   SpO2 96%   BMI 37.77 kg/m       Objective:   Physical Exam  Constitutional: She appears well-developed and well-nourished. No distress.  HENT:  Head: Normocephalic and atraumatic.  Right Ear: External ear normal.  Left Ear: External ear normal.  Nose: Nose normal.  Mouth/Throat: Oropharynx is clear and moist.  Eyes: Conjunctivae and EOM  are normal. Right eye exhibits no discharge. No scleral icterus.  Neck: Normal range of motion. Neck supple. No thyromegaly present.  Cardiovascular: Normal rate, regular rhythm, normal heart sounds and intact distal pulses.  Exam reveals no gallop and no friction rub.   No murmur heard. Pulmonary/Chest: No respiratory distress. She has wheezes (trace throughout ). She has no rales. She exhibits no tenderness.  Lymphadenopathy:    She has no cervical adenopathy.  Neurological: She is  alert.  Skin: Skin is warm and dry. No rash noted. She is not diaphoretic. No erythema. No pallor.  Psychiatric: She has a normal mood and affect. Her behavior is normal. Judgment and thought content normal.  Nursing note and vitals reviewed.     Assessment & Plan:  1. Upper respiratory tract infection, unspecified type - She has failed other therapies. Will send in prednisone. Advised to drink more water when taking prednisone.  - predniSONE (DELTASONE) 10 MG tablet; 40 mg x 3 days, 20 mg x 3 days, 10 mg x 3 days  Dispense: 21 tablet; Refill: 0  2. Pre-diabetes - POC HgB A1c- 5.8  [name], NP

## 2017-06-16 ENCOUNTER — Other Ambulatory Visit: Payer: Self-pay | Admitting: Emergency Medicine

## 2017-06-19 ENCOUNTER — Other Ambulatory Visit: Payer: Self-pay | Admitting: Adult Health

## 2017-06-20 ENCOUNTER — Encounter: Payer: Self-pay | Admitting: Adult Health

## 2017-06-20 NOTE — Telephone Encounter (Signed)
Ok to refill for 6 months 

## 2017-06-21 NOTE — Telephone Encounter (Signed)
Sent to the pharmacy as directed. 

## 2017-06-22 DIAGNOSIS — M722 Plantar fascial fibromatosis: Secondary | ICD-10-CM | POA: Diagnosis not present

## 2017-06-22 DIAGNOSIS — M79672 Pain in left foot: Secondary | ICD-10-CM | POA: Diagnosis not present

## 2017-06-22 DIAGNOSIS — G8929 Other chronic pain: Secondary | ICD-10-CM | POA: Diagnosis not present

## 2017-07-07 DIAGNOSIS — G4733 Obstructive sleep apnea (adult) (pediatric): Secondary | ICD-10-CM | POA: Diagnosis not present

## 2017-07-14 ENCOUNTER — Ambulatory Visit (INDEPENDENT_AMBULATORY_CARE_PROVIDER_SITE_OTHER): Payer: Medicare HMO | Admitting: Adult Health

## 2017-07-14 ENCOUNTER — Encounter: Payer: Self-pay | Admitting: Adult Health

## 2017-07-14 VITALS — BP 116/60 | Temp 98.2°F | Ht 59.0 in | Wt 180.0 lb

## 2017-07-14 DIAGNOSIS — Z23 Encounter for immunization: Secondary | ICD-10-CM

## 2017-07-14 DIAGNOSIS — E039 Hypothyroidism, unspecified: Secondary | ICD-10-CM

## 2017-07-14 DIAGNOSIS — R7303 Prediabetes: Secondary | ICD-10-CM | POA: Diagnosis not present

## 2017-07-14 DIAGNOSIS — M810 Age-related osteoporosis without current pathological fracture: Secondary | ICD-10-CM | POA: Diagnosis not present

## 2017-07-14 DIAGNOSIS — Z0000 Encounter for general adult medical examination without abnormal findings: Secondary | ICD-10-CM

## 2017-07-14 DIAGNOSIS — F329 Major depressive disorder, single episode, unspecified: Secondary | ICD-10-CM | POA: Diagnosis not present

## 2017-07-14 DIAGNOSIS — R69 Illness, unspecified: Secondary | ICD-10-CM | POA: Diagnosis not present

## 2017-07-14 DIAGNOSIS — E785 Hyperlipidemia, unspecified: Secondary | ICD-10-CM

## 2017-07-14 LAB — BASIC METABOLIC PANEL
BUN: 18 mg/dL (ref 6–23)
CHLORIDE: 104 meq/L (ref 96–112)
CO2: 30 mEq/L (ref 19–32)
Calcium: 9.5 mg/dL (ref 8.4–10.5)
Creatinine, Ser: 0.68 mg/dL (ref 0.40–1.20)
GFR: 89.83 mL/min (ref >60.00)
Glucose, Bld: 119 mg/dL — ABNORMAL HIGH (ref 70–99)
POTASSIUM: 4.3 meq/L (ref 3.5–5.1)
SODIUM: 139 meq/L (ref 135–145)

## 2017-07-14 LAB — CBC WITH DIFFERENTIAL/PLATELET
BASOS PCT: 0.4 % (ref 0.0–3.0)
Basophils Absolute: 0 10*3/uL (ref 0.0–0.1)
EOS PCT: 3 % (ref 0.0–5.0)
Eosinophils Absolute: 0.2 10*3/uL (ref 0.0–0.7)
HEMATOCRIT: 45.7 % (ref 36.0–46.0)
HEMOGLOBIN: 14.9 g/dL (ref 12.0–15.0)
LYMPHS PCT: 26.1 % (ref 12.0–46.0)
Lymphs Abs: 1.7 10*3/uL (ref 0.7–4.0)
MCHC: 32.5 g/dL (ref 30.0–36.0)
MCV: 92.3 fl (ref 78.0–100.0)
MONO ABS: 0.6 10*3/uL (ref 0.1–1.0)
MONOS PCT: 8.3 % (ref 3.0–12.0)
Neutro Abs: 4.2 10*3/uL (ref 1.4–7.7)
Neutrophils Relative %: 62.2 % (ref 43.0–77.0)
Platelets: 202 10*3/uL (ref 150.0–400.0)
RBC: 4.95 Mil/uL (ref 3.87–5.11)
RDW: 14 % (ref 11.5–15.5)
WBC: 6.7 10*3/uL (ref 4.0–10.5)

## 2017-07-14 LAB — LIPID PANEL
CHOL/HDL RATIO: 4
Cholesterol: 174 mg/dL (ref 0–200)
HDL: 41 mg/dL (ref >39.00)
LDL Cholesterol: 105 mg/dL — ABNORMAL HIGH (ref 0–99)
NonHDL: 132.76
TRIGLYCERIDES: 141 mg/dL (ref 0.0–149.0)
VLDL: 28.2 mg/dL (ref 0.0–40.0)

## 2017-07-14 LAB — TSH: TSH: 0.75 u[IU]/mL (ref 0.35–4.50)

## 2017-07-14 LAB — VITAMIN D 25 HYDROXY (VIT D DEFICIENCY, FRACTURES): VITD: 56.4 ng/mL (ref 30.00–100.00)

## 2017-07-14 LAB — HEPATIC FUNCTION PANEL
ALT: 35 U/L (ref 0–35)
AST: 24 U/L (ref 0–37)
Albumin: 4.3 g/dL (ref 3.5–5.2)
Alkaline Phosphatase: 40 U/L (ref 39–117)
BILIRUBIN DIRECT: 0.1 mg/dL (ref 0.0–0.3)
TOTAL PROTEIN: 6.3 g/dL (ref 6.0–8.3)
Total Bilirubin: 0.6 mg/dL (ref 0.2–1.2)

## 2017-07-14 LAB — HEMOGLOBIN A1C: HEMOGLOBIN A1C: 6 % (ref 4.6–6.5)

## 2017-07-14 NOTE — Patient Instructions (Signed)
It was great seeing you today. I will follow up with you regarding your blood work   Someone will call yo to schedule your bone density screen   Congrats on the weight loss over the last month - keep up the good work

## 2017-07-14 NOTE — Progress Notes (Signed)
Subjective:    [name], female    DOB: 1941-06-30, 76 y.o.   MRN: [id number]  HPI  Patient presents for yearly preventative medicine examination. She is a pleasant 76 year old female who  has a past medical history of Anxiety; Arthritis; Breast cancer ([place]) (08/08/13); Cataract; Chronic insomnia; Cluster headaches; Depression; Fibromyalgia; GERD (gastroesophageal reflux disease); H/O hiatal hernia; History of colonic polyps; History of transfusion (08/30/2013); radiation therapy (10/29/13- 12/14/13); Hypothyroidism; Irritable bowel syndrome; Kidney stone; Lymphedema; Macular degeneration; Osteopenia; Other abnormal glucose; Other and unspecified hyperlipidemia; Pain in joint, shoulder region; Pneumonia (4270,6237); Sleep apnea; and Stress incontinence, female.  All immunizations and health maintenance protocols were reviewed with the patient and needed orders were placed.  Appropriate screening laboratory values were ordered for the patient including screening of hyperlipidemia, renal function and hepatic function. If indicated by BPH, a PSA was ordered.  Medication reconciliation,  past medical history, social history, problem list and allergies were reviewed in detail with the patient  Goals were established with regard to weight loss, exercise, and  diet in compliance with medications. She is eating a low carb diet and is no doing artificial sweeteners. She has been able to lose about 7 pounds in the last month.   End of life planning was discussed.  She has had a colonoscopy this year - she has them every 5 years due to polyps. Her last dexa scan was two years ago, which showed osteoporosis, she currently takes Calcium and Vitamin D.   She takes Zocor 20 mg and Lovasa for history of hyperlipidemia   She takes Synthroid 100 mcg for hypothyroidism - this has been stable   She takes Celexa 20 mg for depression and feels as though this is stable.   Sleep Apnea   Wt  Readings from Last 3 Encounters:  07/14/17 180 lb (81.6 kg)  06/14/17 187 lb (84.8 kg)  06/10/17 186 lb 8 oz (84.6 kg)     Review of Systems  Constitutional: Negative.   HENT: Negative.   Eyes: Negative.   Respiratory: Negative.   Cardiovascular: Negative.   Gastrointestinal: Negative.   Endocrine: Negative.   Genitourinary: Negative.   Musculoskeletal: Negative.   Skin: Negative.   Allergic/Immunologic: Negative.   Neurological: Negative.   Hematological: Negative.   Psychiatric/Behavioral: Negative.   All other systems reviewed and are negative.  Past Medical History:  Diagnosis Date  . Anxiety    PHOBIAS  . Arthritis   . Breast cancer ([place]) 08/08/13   right LOQ  . Cataract   . Chronic insomnia   . Cluster headaches    history of migraines / NONE FOR SEVERAL YRS  . Depression   . Fibromyalgia   . GERD (gastroesophageal reflux disease)   . H/O hiatal hernia   . History of colonic polyps   . History of transfusion 08/30/2013  . Hx of radiation therapy 10/29/13- 12/14/13   right chest wall 5040 cGy 28 sessions, right supraclavicular/axillary region 5040 cGy 28 sessions, right chest wall boost 1000 cGy 5 sessions  . Hypothyroidism   . Irritable bowel syndrome   . Kidney stone   . Lymphedema    RT ARM - WEARS SLEEVE  . Macular degeneration    hole/right eye  . Osteopenia   . Other abnormal glucose   . Other and unspecified hyperlipidemia   . Pain in joint, shoulder region   . Pneumonia 6283,1517  . Sleep apnea    USES C-PAP  .  Stress incontinence, female     Social History   Social History  . Marital status: Married    Spouse name: N/A  . Number of children: 3  . Years of education: N/A   Occupational History  . retired Radiation protection practitioner    Social History Main Topics  . Smoking status: Former Smoker    Packs/day: 0.10    Years: 2.00    Types: Cigarettes    Start date: 11/16/1959    Quit date: 11/15/1960  . Smokeless tobacco: Never Used  . Alcohol use No    . Drug use: No  . Sexual activity: Not on file     Comment: menarche age 35, fist live birth 89, P 3, hysterectomy age 51, no HRT, BCP 2 yrs   Other Topics Concern  . Not on file   Social History Narrative   Occupation:  Retired Radiation protection practitioner    Married with 3 grown children      Never Smoked     Alcohol use-no         Past Surgical History:  Procedure Laterality Date  . ABDOMINAL HYSTERECTOMY    . APPENDECTOMY    . BILATERAL TOTAL MASTECTOMY WITH AXILLARY LYMPH NODE DISSECTION  08/30/2013   Dr Barry Dienes  . BREAST CYST ASPIRATION     9 cysts  . CATARACT EXTRACTION, BILATERAL  2005/2007  . CHOLECYSTECTOMY    . COLONOSCOPY    . EVACUATION BREAST HEMATOMA Left 08/31/2013   Procedure: EVACUATION HEMATOMA BREAST;  Surgeon: Stark Klein, MD;  Location: [facility];  Service: General;  Laterality: Left;  . EYE SURGERY     to repair macular hole  . FOOT ARTHROPLASTY     lt   . GANGLION CYST EXCISION     rt foot  . HEMORRHOID SURGERY     03/1993  . JOINT REPLACEMENT  03/15/11   left knee replacement  . KNEE ARTHROSCOPY     /partial knee 2016/left knee 2012  . MASS EXCISION  11/04/2011   Procedure: EXCISION MASS;  Surgeon: Cammie Sickle., MD;  Location: [facility];  Service: Orthopedics;  Laterality: Right;  excisional biopsy right ulna mass  . MASTECTOMY W/ SENTINEL NODE BIOPSY Right 08/30/2013   Procedure: RIGHT  AXILLARY SENTINEL LYMPH NODE BIOPSY; Right Axillary Node Disection;  Surgeon: Stark Klein, MD;  Location: [facility];  Service: General;  Laterality: Right;  Right side nuc med 7:00   . PARTIAL KNEE ARTHROPLASTY Right 11/03/2015   Procedure: RIGHT KNEE MEDIAL UNICOMPARTMENTAL ARTHROPLASTY ;  Surgeon: Gaynelle Arabian, MD;  Location: [facility];  Service: Orthopedics;  Laterality: Right;  . SIMPLE MASTECTOMY WITH AXILLARY SENTINEL NODE BIOPSY Left 08/30/2013   Procedure: Bilateral Breast Mastectomy ;  Surgeon: Stark Klein, MD;  Location: [facility];  Service: General;   Laterality: Left;  . skin tags removed     breast, panty line, neckline  . TOE SURGERY     preventative crossover toe surg/right foot  . TOE SURGERY  2009   left foot/screw  in 2nd toe  . TONSILLECTOMY    . UPPER GASTROINTESTINAL ENDOSCOPY      Family History  Problem Relation Age of Onset  . Stroke Mother        died age 110  . Diabetes Mother   . Breast cancer Mother 50  . Breast cancer Sister 53  . Breast cancer Paternal Aunt 67  . Diabetes Maternal Grandfather   . Breast cancer Paternal Grandmother 46  . Breast  cancer Paternal Aunt        dx in her 55s  . Cancer Maternal Grandmother        intra-abdominal cancer  . Brain cancer Maternal Uncle 8  . Brain cancer Cousin 39       maternal cousin  . Brain cancer Cousin 20       paternal cousin  . Colon cancer Neg Hx     No Known Allergies  Current Outpatient Prescriptions on File Prior to Visit  Medication Sig Dispense Refill  . benzonatate (TESSALON PERLES) 100 MG capsule Take 1 capsule (100 mg total) by mouth 3 (three) times daily as needed for cough. (Patient not taking: Reported on 06/14/2017) 20 capsule 0  . Biotin 10000 MCG TBDP Take by mouth.    . butalbital-acetaminophen-caffeine (FIORICET, ESGIC) 50-325-40 MG tablet Take 1-2 tablets by mouth every 6 (six) hours as needed for headache. (Patient taking differently: Take 1-2 tablets by mouth as needed for headache. ) 20 tablet 3  . Calcium Carb-Cholecalciferol (CALCIUM-VITAMIN D) 500-400 MG-UNIT TABS Take by mouth 2 (two) times daily.     . cholecalciferol (VITAMIN D) 1000 units tablet Take 2,000 Units by mouth daily.    . citalopram (CELEXA) 20 MG tablet Take 20 mg by mouth daily.    . citalopram (CELEXA) 20 MG tablet TAKE ONE TABLET BY MOUTH ONCE DAILY 90 tablet 1  . co-enzyme Q-10 30 MG capsule Take 300 mg by mouth daily.     . diclofenac sodium (VOLTAREN) 1 % GEL as needed.     Marland Kitchen esomeprazole (NEXIUM) 40 MG capsule Take 1 capsule by mouth  daily 90 capsule 1  .  levothyroxine (SYNTHROID, LEVOTHROID) 100 MCG tablet Take 1 tablet (100 mcg total) by mouth daily before breakfast. (Patient taking differently: Take 100 mcg by mouth. Take one pill in middle of night) 90 tablet 1  . LUTEIN PO Take by mouth.    . Melatonin 3 MG CAPS Take by mouth at bedtime.    . meloxicam (MOBIC) 7.5 MG tablet Take 1 tablet (7.5 mg total) by mouth daily. 90 tablet 1  . methocarbamol (ROBAXIN) 500 MG tablet Take 500 mg by mouth every 6 (six) hours as needed for muscle spasms.    Marland Kitchen omega-3 acid ethyl esters (LOVAZA) 1 g capsule Take 1 capsule (1 g total) by mouth 2 (two) times daily. 180 capsule 1  . polyethylene glycol (MIRALAX / GLYCOLAX) packet Take 17 g by mouth daily.    . predniSONE (DELTASONE) 10 MG tablet 40 mg x 3 days, 20 mg x 3 days, 10 mg x 3 days 21 tablet 0  . pregabalin (LYRICA) 75 MG capsule Take 1 capsule (75 mg total) by mouth 2 (two) times daily. (Patient taking differently: Take 75 mg by mouth daily. ) 180 capsule 1  . saccharomyces boulardii (FLORASTOR) 250 MG capsule Take 250 mg by mouth daily.    . simvastatin (ZOCOR) 10 MG tablet Take 1 tablet (10 mg total) by mouth at bedtime. 90 tablet 1  . tamoxifen (NOLVADEX) 20 MG tablet Take 1 tablet (20 mg total) by mouth daily. 90 tablet 4  . traMADol (ULTRAM) 50 MG tablet Take 1-2 tablets (50-100 mg total) by mouth every 6 (six) hours as needed (mild pain). 80 tablet 1   Current Facility-Administered Medications on File Prior to Visit  Medication Dose Route Frequency Provider Last Rate Last Dose  . 0.9 %  sodium chloride infusion  500 mL Intravenous Continuous Lucio Edward  T, MD        There were no vitals taken for this visit.      Objective:   Physical Exam  Constitutional: She is oriented to person, place, and time. She appears well-developed and well-nourished. No distress.  HENT:  Head: Normocephalic and atraumatic.  Right Ear: External ear normal.  Left Ear: External ear normal.  Nose: Nose  normal.  Mouth/Throat: Oropharynx is clear and moist. No oropharyngeal exudate.  Eyes: Pupils are equal, round, and reactive to light. Conjunctivae and EOM are normal. Right eye exhibits no discharge. Left eye exhibits no discharge. No scleral icterus.  Neck: Trachea normal and normal range of motion. Neck supple. No JVD present. Carotid bruit is not present. No tracheal deviation present. No thyroid mass and no thyromegaly present.  Cardiovascular: Normal rate, regular rhythm, normal heart sounds and intact distal pulses.  Exam reveals no gallop and no friction rub.   No murmur heard. Pulmonary/Chest: Effort normal and breath sounds normal. No stridor. No respiratory distress. She has no wheezes. She has no rales. She exhibits no tenderness.  Abdominal: Soft. Bowel sounds are normal. She exhibits no distension and no mass. There is no tenderness. There is no rebound and no guarding.  Genitourinary:  Genitourinary Comments: Deferred breast exam: she has had bilateral mastectomy  Musculoskeletal: Normal range of motion. She exhibits no edema, tenderness or deformity.  Lymphadenopathy:    She has no cervical adenopathy.  Neurological: She is alert and oriented to person, place, and time. She has normal reflexes. She displays normal reflexes. No cranial nerve deficit. She exhibits normal muscle tone. Coordination normal.  Skin: Skin is warm and dry. No rash noted. She is not diaphoretic. No erythema. No pallor.  Psychiatric: She has a normal mood and affect. Her behavior is normal. Judgment and thought content normal.  Nursing note and vitals reviewed.     Assessment & Plan:  1. Routine general medical examination at a health care facility - Follow up in one year  - [place] job with weight loss  - Consider prolia for osteoporosis  - High dose flu given during exam today  - Basic metabolic panel - CBC with Differential/Platelet - Hemoglobin A1c - Hepatic function panel - Lipid panel -  TSH  2. Prediabetes - She has lost 7 pounds in the last month. Continue to work on diet and exercise  - Basic metabolic panel - CBC with Differential/Platelet - Hemoglobin A1c - Hepatic function panel - Lipid panel - TSH  3. Reactive depression (situational) - PHQ 9 score 5 - Continue with Celexa 20 mg   4. Hyperlipidemia, unspecified hyperlipidemia type - Consider change in statin  - Basic metabolic panel - CBC with Differential/Platelet - Hemoglobin A1c - Hepatic function panel - Lipid panel - TSH  5. Hypothyroidism, unspecified type - Consider change in Synthroid  - Basic metabolic panel - CBC with Differential/Platelet - Hemoglobin A1c - Hepatic function panel - Lipid panel - TSH  6. Osteoporosis without current pathological fracture, unspecified osteoporosis type - Consider Prolia  - Vitamin D, 25-hydroxy - DG Bone Density; Future

## 2017-07-14 NOTE — Addendum Note (Signed)
Addended by: Miles Costain T on: 07/14/2017 09:56 AM   Modules accepted: Orders

## 2017-07-28 ENCOUNTER — Encounter (HOSPITAL_BASED_OUTPATIENT_CLINIC_OR_DEPARTMENT_OTHER): Payer: Self-pay

## 2017-07-28 ENCOUNTER — Encounter: Payer: Self-pay | Admitting: Adult Health

## 2017-07-28 ENCOUNTER — Ambulatory Visit (INDEPENDENT_AMBULATORY_CARE_PROVIDER_SITE_OTHER): Payer: Medicare HMO | Admitting: Adult Health

## 2017-07-28 ENCOUNTER — Emergency Department (HOSPITAL_BASED_OUTPATIENT_CLINIC_OR_DEPARTMENT_OTHER)
Admission: EM | Admit: 2017-07-28 | Discharge: 2017-07-28 | Disposition: A | Discharge status: Home / Self Care | Payer: Medicare HMO | Attending: Emergency Medicine | Admitting: Emergency Medicine

## 2017-07-28 ENCOUNTER — Emergency Department (HOSPITAL_BASED_OUTPATIENT_CLINIC_OR_DEPARTMENT_OTHER): Payer: Medicare HMO

## 2017-07-28 VITALS — BP 122/78 | HR 58 | Temp 98.0°F | Wt 182.2 lb

## 2017-07-28 DIAGNOSIS — Y929 Unspecified place or not applicable: Secondary | ICD-10-CM | POA: Diagnosis not present

## 2017-07-28 DIAGNOSIS — Z87891 Personal history of nicotine dependence: Secondary | ICD-10-CM | POA: Insufficient documentation

## 2017-07-28 DIAGNOSIS — S060X0A Concussion without loss of consciousness, initial encounter: Secondary | ICD-10-CM

## 2017-07-28 DIAGNOSIS — J45909 Unspecified asthma, uncomplicated: Secondary | ICD-10-CM | POA: Diagnosis not present

## 2017-07-28 DIAGNOSIS — Y999 Unspecified external cause status: Secondary | ICD-10-CM | POA: Insufficient documentation

## 2017-07-28 DIAGNOSIS — Z79899 Other long term (current) drug therapy: Secondary | ICD-10-CM | POA: Diagnosis not present

## 2017-07-28 DIAGNOSIS — Z96652 Presence of left artificial knee joint: Secondary | ICD-10-CM | POA: Diagnosis not present

## 2017-07-28 DIAGNOSIS — W0110XA Fall on same level from slipping, tripping and stumbling with subsequent striking against unspecified object, initial encounter: Secondary | ICD-10-CM | POA: Insufficient documentation

## 2017-07-28 DIAGNOSIS — Z853 Personal history of malignant neoplasm of breast: Secondary | ICD-10-CM | POA: Diagnosis not present

## 2017-07-28 DIAGNOSIS — E039 Hypothyroidism, unspecified: Secondary | ICD-10-CM | POA: Diagnosis not present

## 2017-07-28 DIAGNOSIS — Y9301 Activity, walking, marching and hiking: Secondary | ICD-10-CM | POA: Diagnosis not present

## 2017-07-28 DIAGNOSIS — R519 Headache, unspecified: Secondary | ICD-10-CM

## 2017-07-28 DIAGNOSIS — S0003XA Contusion of scalp, initial encounter: Secondary | ICD-10-CM

## 2017-07-28 DIAGNOSIS — R51 Headache: Secondary | ICD-10-CM | POA: Diagnosis not present

## 2017-07-28 DIAGNOSIS — S0990XA Unspecified injury of head, initial encounter: Secondary | ICD-10-CM | POA: Diagnosis present

## 2017-07-28 MED ORDER — ACETAMINOPHEN 500 MG PO TABS
1000.0000 mg | ORAL_TABLET | Freq: Once | ORAL | Status: DC
  Filled 2017-07-28: qty 2
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
## 2017-07-28 NOTE — ED Notes (Signed)
Told by EDP to discontinue the Tylenol Order

## 2017-07-28 NOTE — Progress Notes (Signed)
Subjective:    [name], female    DOB: October 25, 1941, 76 y.o.   MRN: [id number]  HPI  76 year old female who  has a past medical history of Anxiety; Arthritis; Breast cancer ([facility]) (08/08/13); Cataract; Chronic insomnia; Cluster headaches; Depression; Fibromyalgia; GERD (gastroesophageal reflux disease); H/O hiatal hernia; History of colonic polyps; History of transfusion (08/30/2013); radiation therapy (10/29/13- 12/14/13); Hypothyroidism; Irritable bowel syndrome; Kidney stone; Lymphedema; Macular degeneration; Osteopenia; Other abnormal glucose; Other and unspecified hyperlipidemia; Pain in joint, shoulder region; Pneumonia (2376,2831); Sleep apnea; and Stress incontinence, female. She presents to the office today for an acute issue of head injury. She reports that she had a mechanical fall this morning and hit her head on a plastic shelf in the garage. She denies any LOC, blurred vision, or headaches. She has not experienced nausea or vomiting. Her biggest complaint is that of pain, dizziness, feeling off balance and fatigue since the fall, 2 hours ago.   She is not on any blood thinners and stopped taking mobic about a week ago.   She denies any lacerations or bleeding   No photo or phonophobia     Review of Systems See HPI   Past Medical History:  Diagnosis Date  . Anxiety    PHOBIAS  . Arthritis   . Breast cancer ([facility]) 08/08/13   right LOQ  . Cataract   . Chronic insomnia   . Cluster headaches    history of migraines / NONE FOR SEVERAL YRS  . Depression   . Fibromyalgia   . GERD (gastroesophageal reflux disease)   . H/O hiatal hernia   . History of colonic polyps   . History of transfusion 08/30/2013  . Hx of radiation therapy 10/29/13- 12/14/13   right chest wall 5040 cGy 28 sessions, right supraclavicular/axillary region 5040 cGy 28 sessions, right chest wall boost 1000 cGy 5 sessions  . Hypothyroidism   . Irritable bowel syndrome   . Kidney stone   .  Lymphedema    RT ARM - WEARS SLEEVE  . Macular degeneration    hole/right eye  . Osteopenia   . Other abnormal glucose   . Other and unspecified hyperlipidemia   . Pain in joint, shoulder region   . Pneumonia 5176,1607  . Sleep apnea    USES C-PAP  . Stress incontinence, female     Social History   Social History  . Marital status: Married    Spouse name: N/A  . Number of children: 3  . Years of education: N/A   Occupational History  . retired Radiation protection practitioner    Social History Main Topics  . Smoking status: Former Smoker    Packs/day: 0.10    Years: 2.00    Types: Cigarettes    Start date: 11/16/1959    Quit date: 11/15/1960  . Smokeless tobacco: Never Used  . Alcohol use No  . Drug use: No  . Sexual activity: Not on file     Comment: menarche age 71, fist live birth 45, P 3, hysterectomy age 32, no HRT, BCP 2 yrs   Other Topics Concern  . Not on file   Social History Narrative   Occupation:  Retired Radiation protection practitioner    Married with 3 grown children      Never Smoked     Alcohol use-no         Past Surgical History:  Procedure Laterality Date  . ABDOMINAL HYSTERECTOMY    . APPENDECTOMY    .  BILATERAL TOTAL MASTECTOMY WITH AXILLARY LYMPH NODE DISSECTION  08/30/2013   Dr Barry Dienes  . BREAST CYST ASPIRATION     9 cysts  . CATARACT EXTRACTION, BILATERAL  2005/2007  . CHOLECYSTECTOMY    . COLONOSCOPY    . EVACUATION BREAST HEMATOMA Left 08/31/2013   Procedure: EVACUATION HEMATOMA BREAST;  Surgeon: Stark Klein, MD;  Location: [facility];  Service: General;  Laterality: Left;  . EYE SURGERY     to repair macular hole  . FOOT ARTHROPLASTY     lt   . GANGLION CYST EXCISION     rt foot  . HEMORRHOID SURGERY     03/1993  . JOINT REPLACEMENT  03/15/11   left knee replacement  . KNEE ARTHROSCOPY     /partial knee 2016/left knee 2012  . MASS EXCISION  11/04/2011   Procedure: EXCISION MASS;  Surgeon: Cammie Sickle., MD;  Location: [facility];  Service:  Orthopedics;  Laterality: Right;  excisional biopsy right ulna mass  . MASTECTOMY W/ SENTINEL NODE BIOPSY Right 08/30/2013   Procedure: RIGHT  AXILLARY SENTINEL LYMPH NODE BIOPSY; Right Axillary Node Disection;  Surgeon: Stark Klein, MD;  Location: [facility];  Service: General;  Laterality: Right;  Right side nuc med 7:00   . PARTIAL KNEE ARTHROPLASTY Right 11/03/2015   Procedure: RIGHT KNEE MEDIAL UNICOMPARTMENTAL ARTHROPLASTY ;  Surgeon: Gaynelle Arabian, MD;  Location: [facility];  Service: Orthopedics;  Laterality: Right;  . SIMPLE MASTECTOMY WITH AXILLARY SENTINEL NODE BIOPSY Left 08/30/2013   Procedure: Bilateral Breast Mastectomy ;  Surgeon: Stark Klein, MD;  Location: [facility];  Service: General;  Laterality: Left;  . skin tags removed     breast, panty line, neckline  . TOE SURGERY     preventative crossover toe surg/right foot  . TOE SURGERY  2009   left foot/screw  in 2nd toe  . TONSILLECTOMY    . UPPER GASTROINTESTINAL ENDOSCOPY      Family History  Problem Relation Age of Onset  . Stroke Mother        died age 70  . Diabetes Mother   . Breast cancer Mother 34  . Breast cancer Sister 71  . Breast cancer Paternal Aunt 14  . Diabetes Maternal Grandfather   . Breast cancer Paternal Grandmother 44  . Breast cancer Paternal Aunt        dx in her 75s  . Cancer Maternal Grandmother        intra-abdominal cancer  . Brain cancer Maternal Uncle 8  . Brain cancer Cousin 14       maternal cousin  . Brain cancer Cousin 20       paternal cousin  . Colon cancer Neg Hx     No Known Allergies  Current Outpatient Prescriptions on File Prior to Visit  Medication Sig Dispense Refill  . Biotin 10000 MCG TBDP Take by mouth.    . butalbital-acetaminophen-caffeine (FIORICET, ESGIC) 50-325-40 MG tablet Take 1-2 tablets by mouth every 6 (six) hours as needed for headache. (Patient taking differently: Take 1-2 tablets by mouth as needed for headache. ) 20 tablet 3  . Calcium Carb-Cholecalciferol  (CALCIUM-VITAMIN D) 500-400 MG-UNIT TABS Take by mouth 2 (two) times daily.     . cholecalciferol (VITAMIN D) 1000 units tablet Take 2,000 Units by mouth daily.    . citalopram (CELEXA) 20 MG tablet Take 20 mg by mouth daily.    Marland Kitchen co-enzyme Q-10 30 MG capsule Take 300 mg by mouth  daily.     . diclofenac sodium (VOLTAREN) 1 % GEL as needed.     Marland Kitchen esomeprazole (NEXIUM) 40 MG capsule Take 1 capsule by mouth  daily 90 capsule 1  . levothyroxine (SYNTHROID, LEVOTHROID) 100 MCG tablet Take 1 tablet (100 mcg total) by mouth daily before breakfast. (Patient taking differently: Take 100 mcg by mouth. Take one pill in middle of night) 90 tablet 1  . LUTEIN PO Take by mouth.    . Melatonin 3 MG CAPS Take by mouth at bedtime.    . meloxicam (MOBIC) 7.5 MG tablet Take 1 tablet (7.5 mg total) by mouth daily. 90 tablet 1  . methocarbamol (ROBAXIN) 500 MG tablet Take 500 mg by mouth every 6 (six) hours as needed for muscle spasms.    Marland Kitchen omega-3 acid ethyl esters (LOVAZA) 1 g capsule Take 1 capsule (1 g total) by mouth 2 (two) times daily. 180 capsule 1  . polyethylene glycol (MIRALAX / GLYCOLAX) packet Take 17 g by mouth daily.    . predniSONE (DELTASONE) 10 MG tablet 40 mg x 3 days, 20 mg x 3 days, 10 mg x 3 days 21 tablet 0  . pregabalin (LYRICA) 75 MG capsule Take 1 capsule (75 mg total) by mouth 2 (two) times daily. (Patient taking differently: Take 75 mg by mouth daily. ) 180 capsule 1  . saccharomyces boulardii (FLORASTOR) 250 MG capsule Take 250 mg by mouth daily.    . simvastatin (ZOCOR) 10 MG tablet Take 1 tablet (10 mg total) by mouth at bedtime. 90 tablet 1  . tamoxifen (NOLVADEX) 20 MG tablet Take 1 tablet (20 mg total) by mouth daily. 90 tablet 4  . traMADol (ULTRAM) 50 MG tablet Take 1-2 tablets (50-100 mg total) by mouth every 6 (six) hours as needed (mild pain). 80 tablet 1   No current facility-administered medications on file prior to visit.     BP 122/78   Pulse (!) 58   Temp 98 F (36.7  C) (Oral)   Wt 182 lb 3.2 oz (82.6 kg)   SpO2 95%   BMI 36.80 kg/m       Objective:   Physical Exam  Constitutional: She is oriented to person, place, and time. She appears well-developed and well-nourished. No distress.  HENT:  Right Ear: Hearing, tympanic membrane, external ear and ear canal normal.  Left Ear: Hearing, tympanic membrane, external ear and ear canal normal.  Nose: Nose normal.  Mouth/Throat: Uvula is midline, oropharynx is clear and moist and mucous membranes are normal.  Eyes: Pupils are equal, round, and reactive to light. Conjunctivae and EOM are normal. Right eye exhibits no discharge. Left eye exhibits no discharge. No scleral icterus.  Cardiovascular: Normal rate, regular rhythm, normal heart sounds and intact distal pulses.  Exam reveals no gallop and no friction rub.   No murmur heard. Pulmonary/Chest: Effort normal and breath sounds normal. No respiratory distress. She has no wheezes. She has no rales. She exhibits no tenderness.  Neurological: She is alert and oriented to person, place, and time.  Skin: Skin is warm and dry. No rash noted. She is not diaphoretic. No erythema. No pallor.  Hematoma noted on right forehead    Psychiatric: She has a normal mood and affect. Her behavior is normal. Judgment and thought content normal.  Nursing note and vitals reviewed.     Assessment & Plan:  1. Concussion without loss of consciousness, initial encounter - Possible concussion symptoms  - Advised rest -  Can use ice pack on forehead for 20 minutes at a time  - Precautions given to when to follow up in the ER. They have understanding of plan   [name], NP

## 2017-07-28 NOTE — ED Triage Notes (Addendum)
Pt states she tripped/fell this am-struck head on plastic shelf-no LOC-was seen by PA-was advised to come to ED if worse-pt states pain is worse and now in separate area of head and now having pain to both knees/reports may be r/t to hx of chronic knee pain -NAD-steady gait

## 2017-07-28 NOTE — ED Notes (Signed)
Pt. Was seen by EDP for bruising to the R side of forehead that happened when she hit a cabinet in her garage today.  NO LOC and no noted dizziness.  Pt. Reports she is here due to increased pain and edema thru the day today.  Pt. In no distress.  Pt has already been to PCP today for same.

## 2017-07-29 ENCOUNTER — Other Ambulatory Visit: Payer: Self-pay | Admitting: Adult Health

## 2017-08-05 ENCOUNTER — Encounter: Payer: Self-pay | Admitting: Adult Health

## 2017-08-12 ENCOUNTER — Ambulatory Visit (INDEPENDENT_AMBULATORY_CARE_PROVIDER_SITE_OTHER)
Admission: RE | Admit: 2017-08-12 | Discharge: 2017-08-12 | Disposition: A | Discharge status: Home / Self Care | Payer: Medicare HMO | Source: Ambulatory Visit | Attending: Adult Health | Admitting: Adult Health

## 2017-08-12 DIAGNOSIS — M810 Age-related osteoporosis without current pathological fracture: Secondary | ICD-10-CM | POA: Diagnosis not present

## 2017-08-16 ENCOUNTER — Telehealth: Payer: Self-pay | Admitting: *Deleted

## 2017-08-16 ENCOUNTER — Emergency Department (HOSPITAL_BASED_OUTPATIENT_CLINIC_OR_DEPARTMENT_OTHER): Payer: Medicare HMO

## 2017-08-16 ENCOUNTER — Emergency Department (HOSPITAL_BASED_OUTPATIENT_CLINIC_OR_DEPARTMENT_OTHER)
Admission: EM | Admit: 2017-08-16 | Discharge: 2017-08-16 | Disposition: A | Discharge status: Home / Self Care | Payer: Medicare HMO | Attending: Emergency Medicine | Admitting: Emergency Medicine

## 2017-08-16 DIAGNOSIS — Z96651 Presence of right artificial knee joint: Secondary | ICD-10-CM | POA: Insufficient documentation

## 2017-08-16 DIAGNOSIS — Z79899 Other long term (current) drug therapy: Secondary | ICD-10-CM | POA: Diagnosis not present

## 2017-08-16 DIAGNOSIS — E039 Hypothyroidism, unspecified: Secondary | ICD-10-CM | POA: Insufficient documentation

## 2017-08-16 DIAGNOSIS — Z87891 Personal history of nicotine dependence: Secondary | ICD-10-CM | POA: Diagnosis not present

## 2017-08-16 DIAGNOSIS — Z923 Personal history of irradiation: Secondary | ICD-10-CM | POA: Insufficient documentation

## 2017-08-16 DIAGNOSIS — Z96652 Presence of left artificial knee joint: Secondary | ICD-10-CM | POA: Diagnosis not present

## 2017-08-16 DIAGNOSIS — Z853 Personal history of malignant neoplasm of breast: Secondary | ICD-10-CM | POA: Diagnosis not present

## 2017-08-16 DIAGNOSIS — S8991XA Unspecified injury of right lower leg, initial encounter: Secondary | ICD-10-CM | POA: Diagnosis not present

## 2017-08-16 DIAGNOSIS — M25561 Pain in right knee: Secondary | ICD-10-CM | POA: Diagnosis not present

## 2017-08-16 MED ORDER — METHYLPREDNISOLONE 4 MG PO TBPK: ORAL_TABLET | ORAL | 0 refills | Status: DC

## 2017-08-16 MED FILL — METHYLPREDNISOLONE 4 MG TAB: 4 | 6 days supply | Qty: 21 | Fill #0

## 2017-08-16 NOTE — Telephone Encounter (Signed)
Pt currently in ED.

## 2017-08-16 NOTE — ED Triage Notes (Signed)
Pt reports a fall 2 weeks ago; c/o right knee pain since then but failed to say anything at the time she was seen for the fall. Pt states she did not fall again since then.

## 2017-08-16 NOTE — ED Notes (Signed)
Pt in XR. 

## 2017-08-16 NOTE — Telephone Encounter (Signed)
Patient's husband called stating he took patient to the ER because she fell and she needs an MRI on her knee, he also stated he called the orthopedics to get her an appointment and they can't get her in for 7-10 days. Per the husband patient cannot stand up on this knee and he would like to see if Tommi Rumps can do anything about getting an MRI for patient. Please contact the husband at [phone]

## 2017-08-18 DIAGNOSIS — M1711 Unilateral primary osteoarthritis, right knee: Secondary | ICD-10-CM | POA: Diagnosis not present

## 2017-08-18 NOTE — Telephone Encounter (Signed)
Spoke to [place] (husband) and he informed me that the pt has an appt with Dr. Peri Maris office today at 2 PM.  No further action needed.  Will call back if needed.

## 2017-08-18 NOTE — ED Provider Notes (Signed)
[facility] DEPT Provider Note   CSN: [id number] Arrival date & time: 08/16/17  1307     History   Chief Complaint Chief Complaint  Patient presents with  . Knee Pain    HPI Jean Nash is a 76 y.o. female.  HPI Patient withright-sided knee pain. Had a fall 2 weeks ago. Had fallen more on the left side but has had some pain on the right side since. No new fall. Had hit her head with the previous fall. No numbness or weakness. Now more pain in the right knee. Has had a partial knee replacement on the right side and complete knee on the left side. States that the outside of her right knee was replaced. No numbness or weakness. Past Medical History:  Diagnosis Date  . Anxiety    PHOBIAS  . Arthritis   . Breast cancer ([place]) 08/08/13   right LOQ  . Cataract   . Chronic insomnia   . Cluster headaches    history of migraines / NONE FOR SEVERAL YRS  . Depression   . Fibromyalgia   . GERD (gastroesophageal reflux disease)   . H/O hiatal hernia   . History of colonic polyps   . History of transfusion 08/30/2013  . Hx of radiation therapy 10/29/13- 12/14/13   right chest wall 5040 cGy 28 sessions, right supraclavicular/axillary region 5040 cGy 28 sessions, right chest wall boost 1000 cGy 5 sessions  . Hypothyroidism   . Irritable bowel syndrome   . Kidney stone   . Lymphedema    RT ARM - WEARS SLEEVE  . Macular degeneration    hole/right eye  . Osteopenia   . Other abnormal glucose   . Other and unspecified hyperlipidemia   . Pain in joint, shoulder region   . Pneumonia 3474,2595  . Sleep apnea    USES C-PAP  . Stress incontinence, female     Patient Active Problem List   Diagnosis Date Noted  . Osteoporosis 07/14/2017  . OA (osteoarthritis) of knee 11/03/2015  . Osteopenia 08/08/2015  . Obstructive sleep apnea 06/25/2015  . Headache 02/03/2015  . Atypical chest pain 07/15/2014  . Arthralgia 02/22/2014  . Psoriasis 02/22/2014  . Breast cancer of lower-outer  quadrant of right female breast ([place]) 08/09/2013  . Neck pain 06/22/2013  . Left knee pain 11/29/2012  . Gastroenteritis 04/26/2012  . Reactive depression (situational) 04/26/2012  . Right wrist pain 02/02/2012  . Right foot pain 10/04/2011  . Nevus 06/05/2011  . Status post total knee replacement 04/06/2011  . Overactive bladder 03/14/2011  . KNEE PAIN, LEFT 08/31/2010  . HEARING LOSS 08/17/2010  . HIRSUTISM 06/16/2009  . CYST, IDIOPATHIC 05/20/2008  . IRRITABLE BOWEL SYNDROME 04/15/2008  . Allergic asthma, mild intermittent, uncomplicated 63/87/5643  . GERD 03/19/2008  . COLONIC POLYPS, HX OF 03/19/2008  . NEPHROLITHIASIS, HX OF 03/19/2008  . Hypothyroidism 03/18/2008  . Hyperlipidemia 03/18/2008  . INSOMNIA, CHRONIC 03/18/2008  . FIBROMYALGIA 03/18/2008  . Prediabetes 03/18/2008    Past Surgical History:  Procedure Laterality Date  . ABDOMINAL HYSTERECTOMY    . APPENDECTOMY    . BILATERAL TOTAL MASTECTOMY WITH AXILLARY LYMPH NODE DISSECTION  08/30/2013   Dr Barry Dienes  . BREAST CYST ASPIRATION     9 cysts  . CATARACT EXTRACTION, BILATERAL  2005/2007  . CHOLECYSTECTOMY    . COLONOSCOPY    . EVACUATION BREAST HEMATOMA Left 08/31/2013   Procedure: EVACUATION HEMATOMA BREAST;  Surgeon: Stark Klein, MD;  Location: [facility];  Service: General;  Laterality: Left;  . EYE SURGERY     to repair macular hole  . FOOT ARTHROPLASTY     lt   . GANGLION CYST EXCISION     rt foot  . HEMORRHOID SURGERY     03/1993  . JOINT REPLACEMENT  03/15/11   left knee replacement  . KNEE ARTHROSCOPY     /partial knee 2016/left knee 2012  . MASS EXCISION  11/04/2011   Procedure: EXCISION MASS;  Surgeon: Cammie Sickle., MD;  Location: [facility];  Service: Orthopedics;  Laterality: Right;  excisional biopsy right ulna mass  . MASTECTOMY W/ SENTINEL NODE BIOPSY Right 08/30/2013   Procedure: RIGHT  AXILLARY SENTINEL LYMPH NODE BIOPSY; Right Axillary Node Disection;  Surgeon: Stark Klein, MD;  Location: [facility];  Service: General;  Laterality: Right;  Right side nuc med 7:00   . PARTIAL KNEE ARTHROPLASTY Right 11/03/2015   Procedure: RIGHT KNEE MEDIAL UNICOMPARTMENTAL ARTHROPLASTY ;  Surgeon: Gaynelle Arabian, MD;  Location: [facility];  Service: Orthopedics;  Laterality: Right;  . SIMPLE MASTECTOMY WITH AXILLARY SENTINEL NODE BIOPSY Left 08/30/2013   Procedure: Bilateral Breast Mastectomy ;  Surgeon: Stark Klein, MD;  Location: [facility];  Service: General;  Laterality: Left;  . skin tags removed     breast, panty line, neckline  . TOE SURGERY     preventative crossover toe surg/right foot  . TOE SURGERY  2009   left foot/screw  in 2nd toe  . TONSILLECTOMY    . UPPER GASTROINTESTINAL ENDOSCOPY      OB History    No data available       Home Medications    Prior to Admission medications   Medication Sig Start Date End Date Taking? Authorizing Provider  Calcium Carb-Cholecalciferol (CALCIUM-VITAMIN D) 500-400 MG-UNIT TABS Take by mouth 2 (two) times daily.    Yes [provider]  citalopram (CELEXA) 20 MG tablet Take 20 mg by mouth daily.   Yes [provider]  esomeprazole (NEXIUM) 40 MG capsule Take 1 capsule by mouth  daily 01/26/17  Yes Nafziger, Tommi Rumps, NP  levothyroxine (SYNTHROID, LEVOTHROID) 100 MCG tablet Take 1 tablet (100 mcg total) by mouth daily before breakfast. Patient taking differently: Take 100 mcg by mouth. Take one pill in middle of night 03/02/17  Yes Nafziger, Tommi Rumps, NP  LUTEIN PO Take by mouth.   Yes [provider]  omega-3 acid ethyl esters (LOVAZA) 1 g capsule Take 1 capsule (1 g total) by mouth 2 (two) times daily. 01/06/17  Yes Nafziger, Tommi Rumps, NP  tamoxifen (NOLVADEX) 20 MG tablet Take 1 tablet (20 mg total) by mouth daily. 09/08/16  Yes Magrinat, Virgie Dad, MD  Biotin 10000 MCG TBDP Take by mouth.    [provider]  butalbital-acetaminophen-caffeine (FIORICET, ESGIC) 50-325-40 MG tablet Take 1-2 tablets by mouth  every 6 (six) hours as needed for headache. Patient taking differently: Take 1-2 tablets by mouth as needed for headache.  11/17/16 11/17/17  Nafziger, Tommi Rumps, NP  cholecalciferol (VITAMIN D) 1000 units tablet Take 2,000 Units by mouth daily.    [provider]  co-enzyme Q-10 30 MG capsule Take 300 mg by mouth daily.     [provider]  diclofenac sodium (VOLTAREN) 1 % GEL as needed.  11/16/16   [provider]  Melatonin 3 MG CAPS Take by mouth at bedtime.    [provider]  meloxicam (MOBIC) 7.5 MG tablet Take 1 tablet (7.5 mg total) by mouth  daily. 01/26/16   Shawna Orleans, Doe-Hyun R, DO  methocarbamol (ROBAXIN) 500 MG tablet Take 500 mg by mouth every 6 (six) hours as needed for muscle spasms.    [provider]  methylPREDNISolone (MEDROL DOSEPAK) 4 MG TBPK tablet Use per package directions 08/16/17   Davonna Belling, MD  polyethylene glycol (MIRALAX / GLYCOLAX) packet Take 17 g by mouth daily.    [provider]  predniSONE (DELTASONE) 10 MG tablet 40 mg x 3 days, 20 mg x 3 days, 10 mg x 3 days 06/14/17   [name], NP  pregabalin (LYRICA) 75 MG capsule Take 1 capsule (75 mg total) by mouth 2 (two) times daily. Patient taking differently: Take 75 mg by mouth daily.  01/26/16   Shawna Orleans, Doe-Hyun R, DO  saccharomyces boulardii (FLORASTOR) 250 MG capsule Take 250 mg by mouth daily.    [provider]  simvastatin (ZOCOR) 10 MG tablet TAKE 1 TABLET BY MOUTH AT BEDTIME 07/29/17   Nafziger, Tommi Rumps, NP  traMADol (ULTRAM) 50 MG tablet Take 1-2 tablets (50-100 mg total) by mouth every 6 (six) hours as needed (mild pain). 11/04/15   Perkins, Alexzandrew L, PA-C    Family History Family History  Problem Relation Age of Onset  . Stroke Mother        died age 44  . Diabetes Mother   . Breast cancer Mother 66  . Breast cancer Sister 43  . Breast cancer Paternal Aunt 48  . Diabetes Maternal Grandfather   . Breast cancer Paternal Grandmother 71  .  Breast cancer Paternal Aunt        dx in her 68s  . Cancer Maternal Grandmother        intra-abdominal cancer  . Brain cancer Maternal Uncle 8  . Brain cancer Cousin 44       maternal cousin  . Brain cancer Cousin 20       paternal cousin  . Colon cancer Neg Hx     Social History Social History  Substance Use Topics  . Smoking status: Former Smoker    Packs/day: 0.10    Years: 2.00    Types: Cigarettes    Start date: 11/16/1959    Quit date: 11/15/1960  . Smokeless tobacco: Never Used  . Alcohol use No     Allergies   Patient has no known allergies.   Review of Systems Review of Systems  Constitutional: Negative for chills.  Cardiovascular: Negative for chest pain.  Gastrointestinal: Negative for abdominal pain.  Genitourinary: Negative for flank pain.  Musculoskeletal:       Right knee pain  Neurological: Negative for weakness and numbness.     Physical Exam Updated Vital Signs BP 112/64 (BP Location: Left Arm)   Pulse (!) 54   Temp 98.3 F (36.8 C) (Oral)   Resp 16   Ht 4\' 11"  (1.499 m)   Wt 79.8 kg (176 lb)   SpO2 99%   BMI 35.55 kg/m   Physical Exam  Constitutional: She appears well-developed.  HENT:  Head: Atraumatic.  Cardiovascular: Normal rate.   Abdominal: There is no tenderness.  Musculoskeletal: She exhibits tenderness.  Scars on bilateral anterior knees from previous knee replacement. Right knee has some pain laterally. Knee is stable. No large effusion. Neurovascularly intact in left foot. Good range of motion right hip.  Skin: Skin is warm. Capillary refill takes less than 2 seconds.     ED Treatments / Results  Labs (all labs ordered are listed, but  only abnormal results are displayed) Labs Reviewed - No data to display  EKG  EKG Interpretation None       Radiology No results found.  Procedures Procedures (including critical care time)  Medications Ordered in ED Medications - No data to display   Initial Impression /  Assessment and Plan / ED Course  I have reviewed the triage vital signs and the nursing notes.  Pertinent labs & imaging results that were available during my care of the patient were reviewed by me and considered in my medical decision making (see chart for details).     Patient with pain in right knee. X-ray overall reassuring. Doubt infection. Previous partial knee replacement. Will follow up with either sports medicine or orthopedic surgery. Medrol Dosepak to see if it will help. Has knee immobilizer and crutches at home. Will discharge.  Final Clinical Impressions(s) / ED Diagnoses   Final diagnoses:  Acute pain of right knee    New Prescriptions Discharge Medication List as of 08/16/2017  2:58 PM    START taking these medications   Details  methylPREDNISolone (MEDROL DOSEPAK) 4 MG TBPK tablet Use per package directions, Print         Davonna Belling, MD 08/18/17 [phone]

## 2017-08-18 NOTE — Telephone Encounter (Signed)
I cannot order an MRI for her without seeing her first. If they are going to follow up with orthopedics, I would recommend talking to them about getting an MRI.

## 2017-08-25 DIAGNOSIS — M1711 Unilateral primary osteoarthritis, right knee: Secondary | ICD-10-CM | POA: Diagnosis not present

## 2017-08-31 ENCOUNTER — Other Ambulatory Visit: Payer: Self-pay

## 2017-08-31 DIAGNOSIS — C50511 Malignant neoplasm of lower-outer quadrant of right female breast: Secondary | ICD-10-CM

## 2017-08-31 DIAGNOSIS — Z170 Estrogen receptor positive status [ER+]: Principal | ICD-10-CM

## 2017-09-01 ENCOUNTER — Other Ambulatory Visit (HOSPITAL_BASED_OUTPATIENT_CLINIC_OR_DEPARTMENT_OTHER): Payer: Medicare HMO

## 2017-09-01 DIAGNOSIS — C50511 Malignant neoplasm of lower-outer quadrant of right female breast: Secondary | ICD-10-CM

## 2017-09-01 DIAGNOSIS — Z170 Estrogen receptor positive status [ER+]: Principal | ICD-10-CM

## 2017-09-01 LAB — COMPREHENSIVE METABOLIC PANEL
ALBUMIN: 3.5 g/dL (ref 3.5–5.0)
ALT: 32 U/L (ref 0–55)
AST: 20 U/L (ref 5–34)
Alkaline Phosphatase: 43 U/L (ref 40–150)
Anion Gap: 9 mEq/L (ref 3–11)
BUN: 15.8 mg/dL (ref 7.0–26.0)
CHLORIDE: 107 meq/L (ref 98–109)
CO2: 24 mEq/L (ref 22–29)
Calcium: 8.7 mg/dL (ref 8.4–10.4)
Creatinine: 0.8 mg/dL (ref 0.6–1.1)
EGFR: >60 mL/min/{1.73_m2} (ref >60)
Glucose: 132 mg/dl (ref 70–140)
Potassium: 4.1 mEq/L (ref 3.5–5.1)
Sodium: 140 mEq/L (ref 136–145)
TOTAL PROTEIN: 6.1 g/dL — AB (ref 6.4–8.3)
Total Bilirubin: 0.61 mg/dL (ref 0.20–1.20)

## 2017-09-01 LAB — CBC WITH DIFFERENTIAL/PLATELET
BASO%: 0.4 % (ref 0.0–2.0)
Basophils Absolute: 0 10*3/uL (ref 0.0–0.1)
EOS%: 2 % (ref 0.0–7.0)
Eosinophils Absolute: 0.1 10*3/uL (ref 0.0–0.5)
HEMATOCRIT: 42.1 % (ref 34.8–46.6)
HEMOGLOBIN: 14.1 g/dL (ref 11.6–15.9)
LYMPH#: 1.8 10*3/uL (ref 0.9–3.3)
LYMPH%: 26.2 % (ref 14.0–49.7)
MCH: 30.1 pg (ref 25.1–34.0)
MCHC: 33.4 g/dL (ref 31.5–36.0)
MCV: 90.3 fL (ref 79.5–101.0)
MONO#: 0.5 10*3/uL (ref 0.1–0.9)
MONO%: 7.6 % (ref 0.0–14.0)
NEUT%: 63.8 % (ref 38.4–76.8)
NEUTROS ABS: 4.5 10*3/uL (ref 1.5–6.5)
Platelets: 152 10*3/uL (ref 145–400)
RBC: 4.67 10*6/uL (ref 3.70–5.45)
RDW: 14 % (ref 11.2–14.5)
WBC: 7 10*3/uL (ref 3.9–10.3)

## 2017-09-08 ENCOUNTER — Ambulatory Visit: Payer: Medicare HMO | Admitting: Oncology

## 2017-09-14 ENCOUNTER — Encounter: Payer: Self-pay | Admitting: Adult Health

## 2017-09-14 ENCOUNTER — Other Ambulatory Visit: Payer: Self-pay | Admitting: Adult Health

## 2017-09-14 MED ORDER — OMEGA-3-ACID ETHYL ESTERS 1 G PO CAPS: 1.0000 | ORAL_CAPSULE | Freq: Two times a day (BID) | ORAL | 1 refills | Status: DC

## 2017-09-15 ENCOUNTER — Encounter: Payer: Self-pay | Admitting: Adult Health

## 2017-09-19 ENCOUNTER — Telehealth: Payer: Self-pay | Admitting: Oncology

## 2017-09-19 ENCOUNTER — Ambulatory Visit (HOSPITAL_BASED_OUTPATIENT_CLINIC_OR_DEPARTMENT_OTHER): Payer: Medicare HMO | Admitting: Oncology

## 2017-09-19 VITALS — BP 132/45 | HR 64 | Temp 98.1°F | Resp 18 | Ht 59.0 in | Wt 177.6 lb

## 2017-09-19 DIAGNOSIS — M25561 Pain in right knee: Secondary | ICD-10-CM | POA: Diagnosis not present

## 2017-09-19 DIAGNOSIS — C50511 Malignant neoplasm of lower-outer quadrant of right female breast: Secondary | ICD-10-CM | POA: Diagnosis not present

## 2017-09-19 DIAGNOSIS — Z170 Estrogen receptor positive status [ER+]: Secondary | ICD-10-CM

## 2017-09-19 DIAGNOSIS — M8580 Other specified disorders of bone density and structure, unspecified site: Secondary | ICD-10-CM | POA: Diagnosis not present

## 2017-09-19 DIAGNOSIS — J4520 Mild intermittent asthma, uncomplicated: Secondary | ICD-10-CM

## 2017-09-19 NOTE — Telephone Encounter (Signed)
Gave patient calendar with appt per 11/5 los.
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
## 2017-09-21 ENCOUNTER — Other Ambulatory Visit: Payer: Self-pay | Admitting: Adult Health

## 2017-09-21 MED ORDER — OMEGA-3-ACID ETHYL ESTERS 1 G PO CAPS: 1.0000 | ORAL_CAPSULE | Freq: Two times a day (BID) | ORAL | 3 refills | Status: DC

## 2017-09-28 ENCOUNTER — Other Ambulatory Visit: Payer: Self-pay | Admitting: Adult Health

## 2017-09-28 ENCOUNTER — Encounter: Payer: Self-pay | Admitting: Adult Health

## 2017-09-28 ENCOUNTER — Encounter: Payer: Self-pay | Admitting: Oncology

## 2017-09-28 MED ORDER — LEVOTHYROXINE SODIUM 100 MCG PO TABS: 100.0000 ug | ORAL_TABLET | Freq: Every day | ORAL | 3 refills | Status: DC

## 2017-09-28 MED ORDER — MELOXICAM 7.5 MG PO TABS: 7.5000 mg | ORAL_TABLET | Freq: Every day | ORAL | 1 refills | Status: DC

## 2017-09-29 ENCOUNTER — Other Ambulatory Visit: Payer: Self-pay | Admitting: Adult Health

## 2017-09-29 DIAGNOSIS — M1711 Unilateral primary osteoarthritis, right knee: Secondary | ICD-10-CM | POA: Diagnosis not present

## 2017-09-29 MED ORDER — PREGABALIN 75 MG PO CAPS
75.0000 mg | ORAL_CAPSULE | Freq: Two times a day (BID) | ORAL | 0 refills | Status: DC
Start: 2017-09-29 — End: 2018-02-08

## 2017-10-03 MED ORDER — TAMOXIFEN CITRATE 20 MG PO TABS: 20.0000 mg | ORAL_TABLET | Freq: Every day | ORAL | 4 refills | Status: DC

## 2017-10-19 DIAGNOSIS — G4733 Obstructive sleep apnea (adult) (pediatric): Secondary | ICD-10-CM | POA: Diagnosis not present

## 2017-10-26 DIAGNOSIS — G4733 Obstructive sleep apnea (adult) (pediatric): Secondary | ICD-10-CM | POA: Diagnosis not present

## 2017-11-03 ENCOUNTER — Encounter: Payer: Self-pay | Admitting: Adult Health

## 2017-11-07 ENCOUNTER — Emergency Department (HOSPITAL_BASED_OUTPATIENT_CLINIC_OR_DEPARTMENT_OTHER)
Admission: EM | Admit: 2017-11-07 | Discharge: 2017-11-07 | Disposition: A | Discharge status: Home / Self Care | Payer: Medicare HMO | Attending: Emergency Medicine | Admitting: Emergency Medicine

## 2017-11-07 ENCOUNTER — Other Ambulatory Visit: Payer: Self-pay

## 2017-11-07 ENCOUNTER — Emergency Department (HOSPITAL_BASED_OUTPATIENT_CLINIC_OR_DEPARTMENT_OTHER): Payer: Medicare HMO

## 2017-11-07 ENCOUNTER — Encounter (HOSPITAL_BASED_OUTPATIENT_CLINIC_OR_DEPARTMENT_OTHER): Payer: Self-pay | Admitting: *Deleted

## 2017-11-07 DIAGNOSIS — S62642A Nondisplaced fracture of proximal phalanx of right middle finger, initial encounter for closed fracture: Secondary | ICD-10-CM | POA: Diagnosis not present

## 2017-11-07 DIAGNOSIS — W2209XA Striking against other stationary object, initial encounter: Secondary | ICD-10-CM | POA: Insufficient documentation

## 2017-11-07 DIAGNOSIS — Y999 Unspecified external cause status: Secondary | ICD-10-CM | POA: Insufficient documentation

## 2017-11-07 DIAGNOSIS — M25561 Pain in right knee: Secondary | ICD-10-CM | POA: Diagnosis not present

## 2017-11-07 DIAGNOSIS — Z79899 Other long term (current) drug therapy: Secondary | ICD-10-CM | POA: Diagnosis not present

## 2017-11-07 DIAGNOSIS — S92514A Nondisplaced fracture of proximal phalanx of right lesser toe(s), initial encounter for closed fracture: Secondary | ICD-10-CM | POA: Diagnosis not present

## 2017-11-07 DIAGNOSIS — S99921A Unspecified injury of right foot, initial encounter: Secondary | ICD-10-CM | POA: Diagnosis present

## 2017-11-07 DIAGNOSIS — Y929 Unspecified place or not applicable: Secondary | ICD-10-CM | POA: Insufficient documentation

## 2017-11-07 DIAGNOSIS — S8991XA Unspecified injury of right lower leg, initial encounter: Secondary | ICD-10-CM | POA: Diagnosis not present

## 2017-11-07 DIAGNOSIS — Y939 Activity, unspecified: Secondary | ICD-10-CM | POA: Diagnosis not present

## 2017-11-07 DIAGNOSIS — Z87891 Personal history of nicotine dependence: Secondary | ICD-10-CM | POA: Diagnosis not present

## 2017-11-07 MED ORDER — OXYCODONE-ACETAMINOPHEN 5-325 MG PO TABS: 1.0000 | ORAL_TABLET | Freq: Three times a day (TID) | ORAL | 0 refills | Status: DC | PRN

## 2017-11-07 MED FILL — OXYCODONE-ACETAMINOPHEN 5-3: 5-325 | 3 days supply | Qty: 8 | Fill #0
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
## 2017-11-07 NOTE — ED Provider Notes (Signed)
[facility]ER[facility] DEPARTMENT Provider Note   CSN: [id number] Arrival date & time: 11/07/17  3818     History   Chief Complaint Chief Complaint  Patient presents with  . Foot Injury    HPI Jean Nash is a 76 y.o. female presenting with 2 days of right foot pain and bruising.  She reports that 2 days ago she hit her toe on a stationary bike and has been in pain since.  Pain is aggravated by movement and relieved by rest.  He has been having difficulty sleeping comfortably due to pain.  She has taken her husband's oxycodone with relief at nighttime and Tylenol during the day.  No fall, head trauma or loss of consciousness.  She also reported right knee pain from possibly twisting it she injured her toe.    HPI  Past Medical History:  Diagnosis Date  . Anxiety    PHOBIAS  . Arthritis   . Breast cancer ([place]) 08/08/13   right LOQ  . Cataract   . Chronic insomnia   . Cluster headaches    history of migraines / NONE FOR SEVERAL YRS  . Depression   . Fibromyalgia   . GERD (gastroesophageal reflux disease)   . H/O hiatal hernia   . History of colonic polyps   . History of transfusion 08/30/2013  . Hx of radiation therapy 10/29/13- 12/14/13   right chest wall 5040 cGy 28 sessions, right supraclavicular/axillary region 5040 cGy 28 sessions, right chest wall boost 1000 cGy 5 sessions  . Hypothyroidism   . Irritable bowel syndrome   . Kidney stone   . Lymphedema    RT ARM - WEARS SLEEVE  . Macular degeneration    hole/right eye  . Osteopenia   . Other abnormal glucose   . Other and unspecified hyperlipidemia   . Pain in joint, shoulder region   . Pneumonia 2993,7169  . Sleep apnea    USES C-PAP  . Stress incontinence, female     Patient Active Problem List   Diagnosis Date Noted  . Osteoporosis 07/14/2017  . OA (osteoarthritis) of knee 11/03/2015  . Osteopenia 08/08/2015  . Obstructive sleep apnea 06/25/2015  . Headache 02/03/2015  . Atypical chest  pain 07/15/2014  . Arthralgia 02/22/2014  . Psoriasis 02/22/2014  . Malignant neoplasm of lower-outer quadrant of right breast of female, estrogen receptor positive ([place]) 08/09/2013  . Neck pain 06/22/2013  . Left knee pain 11/29/2012  . Gastroenteritis 04/26/2012  . Reactive depression (situational) 04/26/2012  . Right wrist pain 02/02/2012  . Right foot pain 10/04/2011  . Nevus 06/05/2011  . Status post total knee replacement 04/06/2011  . Overactive bladder 03/14/2011  . KNEE PAIN, LEFT 08/31/2010  . HEARING LOSS 08/17/2010  . HIRSUTISM 06/16/2009  . CYST, IDIOPATHIC 05/20/2008  . IRRITABLE BOWEL SYNDROME 04/15/2008  . Allergic asthma, mild intermittent, uncomplicated 67/89/3810  . GERD 03/19/2008  . COLONIC POLYPS, HX OF 03/19/2008  . NEPHROLITHIASIS, HX OF 03/19/2008  . Hypothyroidism 03/18/2008  . Hyperlipidemia 03/18/2008  . INSOMNIA, CHRONIC 03/18/2008  . FIBROMYALGIA 03/18/2008  . Prediabetes 03/18/2008    Past Surgical History:  Procedure Laterality Date  . ABDOMINAL HYSTERECTOMY    . APPENDECTOMY    . BILATERAL TOTAL MASTECTOMY WITH AXILLARY LYMPH NODE DISSECTION  08/30/2013   Dr Barry Dienes  . BREAST CYST ASPIRATION     9 cysts  . CATARACT EXTRACTION, BILATERAL  2005/2007  . CHOLECYSTECTOMY    . COLONOSCOPY    . EVACUATION  BREAST HEMATOMA Left 08/31/2013   Procedure: EVACUATION HEMATOMA BREAST;  Surgeon: Stark Klein, MD;  Location: [facility];  Service: General;  Laterality: Left;  . EYE SURGERY     to repair macular hole  . FOOT ARTHROPLASTY     lt   . GANGLION CYST EXCISION     rt foot  . HEMORRHOID SURGERY     03/1993  . JOINT REPLACEMENT  03/15/11   left knee replacement  . KNEE ARTHROSCOPY     /partial knee 2016/left knee 2012  . MASS EXCISION  11/04/2011   Procedure: EXCISION MASS;  Surgeon: Cammie Sickle., MD;  Location: [facility];  Service: Orthopedics;  Laterality: Right;  excisional biopsy right ulna mass  . MASTECTOMY W/  SENTINEL NODE BIOPSY Right 08/30/2013   Procedure: RIGHT  AXILLARY SENTINEL LYMPH NODE BIOPSY; Right Axillary Node Disection;  Surgeon: Stark Klein, MD;  Location: [facility];  Service: General;  Laterality: Right;  Right side nuc med 7:00   . PARTIAL KNEE ARTHROPLASTY Right 11/03/2015   Procedure: RIGHT KNEE MEDIAL UNICOMPARTMENTAL ARTHROPLASTY ;  Surgeon: Gaynelle Arabian, MD;  Location: [facility];  Service: Orthopedics;  Laterality: Right;  . SIMPLE MASTECTOMY WITH AXILLARY SENTINEL NODE BIOPSY Left 08/30/2013   Procedure: Bilateral Breast Mastectomy ;  Surgeon: Stark Klein, MD;  Location: [facility];  Service: General;  Laterality: Left;  . skin tags removed     breast, panty line, neckline  . TOE SURGERY     preventative crossover toe surg/right foot  . TOE SURGERY  2009   left foot/screw  in 2nd toe  . TONSILLECTOMY    . UPPER GASTROINTESTINAL ENDOSCOPY      OB History    No data available       Home Medications    Prior to Admission medications   Medication Sig Start Date End Date Taking? Authorizing Provider  Biotin 10000 MCG TBDP Take by mouth.    [provider]  butalbital-acetaminophen-caffeine (FIORICET, ESGIC) 50-325-40 MG tablet Take 1-2 tablets by mouth every 6 (six) hours as needed for headache. Patient taking differently: Take 1-2 tablets by mouth as needed for headache.  11/17/16 11/17/17  Nafziger, Tommi Rumps, NP  Calcium Carb-Cholecalciferol (CALCIUM-VITAMIN D) 500-400 MG-UNIT TABS Take by mouth 2 (two) times daily.     [provider]  cholecalciferol (VITAMIN D) 1000 units tablet Take 2,000 Units by mouth daily.    [provider]  citalopram (CELEXA) 20 MG tablet Take 20 mg by mouth daily.    [provider]  co-enzyme Q-10 30 MG capsule Take 300 mg by mouth daily.     [provider]  diclofenac sodium (VOLTAREN) 1 % GEL as needed.  11/16/16   [provider]  esomeprazole (NEXIUM) 40 MG capsule Take 1 capsule by mouth  daily  01/26/17   Nafziger, Tommi Rumps, NP  levothyroxine (SYNTHROID, LEVOTHROID) 100 MCG tablet Take 1 tablet (100 mcg total) daily before breakfast by mouth. Take one pill in middle of night 09/28/17   [name], NP  LUTEIN PO Take by mouth.    [provider]  Melatonin 3 MG CAPS Take by mouth at bedtime.    [provider]  meloxicam (MOBIC) 7.5 MG tablet Take 1 tablet (7.5 mg total) daily by mouth. 09/28/17   Nafziger, Tommi Rumps, NP  methocarbamol (ROBAXIN) 500 MG tablet Take 500 mg by mouth every 6 (six) hours as needed for muscle spasms.    [provider]  methylPREDNISolone (MEDROL DOSEPAK) 4 MG TBPK tablet Use per package directions 08/16/17   Davonna Belling, MD  omega-3 acid ethyl esters (LOVAZA) 1 g capsule Take 1 capsule (1 g total) 2 (two) times daily by mouth. 09/21/17   Nafziger, Tommi Rumps, NP  oxyCODONE-acetaminophen (PERCOCET/ROXICET) 5-325 MG tablet Take 1 tablet by mouth every 8 (eight) hours as needed for severe pain. 11/07/17   Avie Echevaria B, PA-C  polyethylene glycol (MIRALAX / GLYCOLAX) packet Take 17 g by mouth daily.    [provider]  predniSONE (DELTASONE) 10 MG tablet 40 mg x 3 days, 20 mg x 3 days, 10 mg x 3 days 06/14/17   [name], NP  pregabalin (LYRICA) 75 MG capsule Take 1 capsule (75 mg total) 2 (two) times daily by mouth. 09/29/17   Nafziger, Tommi Rumps, NP  saccharomyces boulardii (FLORASTOR) 250 MG capsule Take 250 mg by mouth daily.    [provider]  simvastatin (ZOCOR) 10 MG tablet TAKE 1 TABLET BY MOUTH AT BEDTIME 07/29/17   Nafziger, Tommi Rumps, NP  tamoxifen (NOLVADEX) 20 MG tablet Take 1 tablet (20 mg total) daily by mouth. 10/03/17   Magrinat, Virgie Dad, MD  traMADol (ULTRAM) 50 MG tablet Take 1-2 tablets (50-100 mg total) by mouth every 6 (six) hours as needed (mild pain). 11/04/15   Perkins, Alexzandrew L, PA-C    Family History Family History  Problem Relation Age of Onset  . Stroke Mother        died age 43  .  Diabetes Mother   . Breast cancer Mother 31  . Breast cancer Sister 6  . Breast cancer Paternal Aunt 23  . Diabetes Maternal Grandfather   . Breast cancer Paternal Grandmother 38  . Breast cancer Paternal Aunt        dx in her 37s  . Cancer Maternal Grandmother        intra-abdominal cancer  . Brain cancer Maternal Uncle 8  . Brain cancer Cousin 79       maternal cousin  . Brain cancer Cousin 20       paternal cousin  . Colon cancer Neg Hx     Social History Social History   Tobacco Use  . Smoking status: Former Smoker    Packs/day: 0.10    Years: 2.00    Pack years: 0.20    Types: Cigarettes    Start date: 11/16/1959    Last attempt to quit: 11/15/1960    Years since quitting: 57.0  . Smokeless tobacco: Never Used  Substance Use Topics  . Alcohol use: No    Alcohol/week: 0.0 oz  . Drug use: No     Allergies   Patient has no known allergies.   Review of Systems Review of Systems  Gastrointestinal: Negative for nausea and vomiting.  Musculoskeletal: Positive for arthralgias and myalgias. Negative for joint swelling, neck pain and neck stiffness.  Skin: Positive for color change. Negative for pallor and rash.  Neurological: Negative for syncope, weakness and numbness.     Physical Exam Updated Vital Signs BP 113/61 (BP Location: Left Arm)   Pulse (!) 56   Temp 97.7 F (36.5 C) (Oral)   Resp 16   Ht 4\' 11"  (1.499 m)   Wt 78.3 kg (172 lb 11.2 oz)   SpO2 98%   BMI 34.88 kg/m   Physical Exam  Constitutional: She appears well-developed and well-nourished. No distress.  Well-appearing, nontoxic afebrile sitting comfortably in bed no acute distress.  HENT:  Head:  Normocephalic and atraumatic.  Eyes: EOM are normal.  Neck: Normal range of motion.  Cardiovascular: Normal rate, regular rhythm, normal heart sounds and intact distal pulses.  No murmur heard. Pulmonary/Chest: Effort normal and breath sounds normal. No stridor. No respiratory distress. She has no  wheezes. She has no rales.  Musculoskeletal: Normal range of motion. She exhibits tenderness. She exhibits no edema or deformity.  Neurological: She is alert. No sensory deficit. She exhibits normal muscle tone.  NVI  Skin: Skin is warm and dry. No rash noted. She is not diaphoretic. No erythema. No pallor.  Ecchymosis at the base of the right fourth toe and distal dorsum of the foot  Psychiatric: She has a normal mood and affect.  Nursing note and vitals reviewed.    ED Treatments / Results  Labs (all labs ordered are listed, but only abnormal results are displayed) Labs Reviewed - No data to display  EKG  EKG Interpretation None       Radiology Dg Knee Complete 4 Views Right  Result Date: 11/07/2017 CLINICAL DATA:  Injury, right medial knee pain EXAM: RIGHT KNEE - COMPLETE 4+ VIEW COMPARISON:  08/16/2017 FINDINGS: Changes of medial compartment hemiarthroplasty. No hardware complicating feature. Degenerative changes in the patellofemoral compartment with joint space narrowing and spurring. No acute bony abnormality. Specifically, no fracture, subluxation, or dislocation. No joint effusion. IMPRESSION: Prior medial compartment hemiarthroplasty. No acute bony abnormality. Electronically Signed   By: Rolm Baptise M.D.   On: 11/07/2017 10:27   Dg Foot Complete Right  Result Date: 11/07/2017 CLINICAL DATA:  76 year old female status post blunt trauma with pain to the top of the right foot with bruising and swelling. EXAM: RIGHT FOOT COMPLETE - 3+ VIEW COMPARISON:  Right foot series 41714. FINDINGS: Stable bone mineralization. Stable mild hallux valgus and metatarsus primus varus. Calcaneus appears stable and intact. Mild dorsal midfoot degenerative spurring. Other tarsal bones appear stable and intact. Metatarsals appear intact with stable alignment. Joint spaces are stable and normal for age outside of the first MTP joint. There is a fracture of the distal aspect of the right fourth  proximal phalanx which is new since 2014. This is mildly impacted but minimally displaced otherwise. It is unclear whether the fourth PIP may be involved. The other phalanges appear intact. IP joint spaces remain normal. No subcutaneous gas. IMPRESSION: 1. Mildly impacted but otherwise nondisplaced fracture of the head of the right fourth proximal phalanx. 2. No other acute osseous abnormality identified about the right foot. Electronically Signed   By: Genevie Ann M.D.   On: 11/07/2017 10:30    Procedures Procedures (including critical care time)  Medications Ordered in ED Medications - No data to display   Initial Impression / Assessment and Plan / ED Course  I have reviewed the triage vital signs and the nursing notes.  Pertinent labs & imaging results that were available during my care of the patient were reviewed by me and considered in my medical decision making (see chart for details).    Patient presenting with nondisplaced fourth proximal phalanx fracture. NVI No other injuries, patient is otherwise well appearing.  Toe was buddy-taped and foot placed in rigid sole post-op shoe.  Discharge home with symptomatic relief and close follow-up with PCP.  Discussed strict return precautions and advised to return to the emergency department if experiencing any new or worsening symptoms. Instructions were understood and patient agreed with discharge plan.  Final Clinical Impressions(s) / ED Diagnoses   Final diagnoses:  Closed nondisplaced fracture of proximal phalanx of lesser toe of right foot, initial encounter    ED Discharge Orders        Ordered    oxyCODONE-acetaminophen (PERCOCET/ROXICET) 5-325 MG tablet  Every 8 hours PRN     11/07/17 1055       Dossie Der 11/07/17 1102    Forde Dandy, MD 11/07/17 1154

## 2017-11-07 NOTE — Discharge Instructions (Signed)
As discussed, wear your post op shoe to help immobilize your fractured toe. Follow up with your primary care provider.  Take pain medicine only as needed for severe pain. Return sooner if symptoms worsen, swelling, numbness or other new concerning symptoms in the meantime.

## 2017-11-07 NOTE — ED Notes (Signed)
Patient transported to X-ray 

## 2017-11-08 ENCOUNTER — Encounter: Payer: Self-pay | Admitting: Adult Health

## 2017-11-09 ENCOUNTER — Other Ambulatory Visit: Payer: Self-pay | Admitting: Adult Health

## 2017-11-09 DIAGNOSIS — F419 Anxiety disorder, unspecified: Secondary | ICD-10-CM

## 2017-11-17 DIAGNOSIS — S92514A Nondisplaced fracture of proximal phalanx of right lesser toe(s), initial encounter for closed fracture: Secondary | ICD-10-CM | POA: Diagnosis not present

## 2017-11-18 ENCOUNTER — Encounter: Payer: Self-pay | Admitting: Adult Health

## 2017-12-09 ENCOUNTER — Telehealth: Payer: Self-pay | Admitting: Family Medicine

## 2017-12-09 DIAGNOSIS — F325 Major depressive disorder, single episode, in full remission: Secondary | ICD-10-CM

## 2017-12-09 DIAGNOSIS — F3289 Other specified depressive episodes: Secondary | ICD-10-CM

## 2017-12-09 NOTE — Telephone Encounter (Signed)
Copied from [facility]. Topic: Referral - Request >> Dec 07, 2017 10:40 AM Arletha Grippe wrote: Reason for CRM: pt needs to have referral to psychiatry. She has discussed thsi with cory already and thought a referral was already put in.  She would like a woman if possible. And needs to take medicare  Please call [phone]  >> Dec 07, 2017 11:25 AM Cox, Melburn Hake, CMA wrote: MISTY - I see where Tommi Rumps said he put in a referral to [facility], but I don't see a dx code or reason...   [name], NP  to [name]   10:04 AM  Good morning Jocelyn Lamer,  I have placed the referral to Behavioral health for you.

## 2017-12-09 NOTE — Telephone Encounter (Signed)
[facility] for referral to psychiatry   Dx: Depression

## 2017-12-09 NOTE — Telephone Encounter (Signed)
Okay for referral? Dx code?

## 2017-12-09 NOTE — Telephone Encounter (Signed)
Referral placed in Epic.

## 2017-12-16 ENCOUNTER — Encounter: Payer: Self-pay | Admitting: Adult Health

## 2017-12-18 ENCOUNTER — Other Ambulatory Visit: Payer: Self-pay | Admitting: Adult Health

## 2017-12-20 NOTE — Telephone Encounter (Signed)
Sent to the pharmacy by e-scribe. 

## 2017-12-22 ENCOUNTER — Ambulatory Visit (INDEPENDENT_AMBULATORY_CARE_PROVIDER_SITE_OTHER): Payer: Medicare HMO | Admitting: Psychiatry

## 2017-12-22 ENCOUNTER — Encounter (HOSPITAL_COMMUNITY): Payer: Self-pay | Admitting: Psychiatry

## 2017-12-22 VITALS — BP 117/73 | HR 66 | Ht 59.0 in | Wt 169.0 lb

## 2017-12-22 DIAGNOSIS — Z87891 Personal history of nicotine dependence: Secondary | ICD-10-CM | POA: Diagnosis not present

## 2017-12-22 DIAGNOSIS — R450 Nervousness: Secondary | ICD-10-CM

## 2017-12-22 DIAGNOSIS — G4700 Insomnia, unspecified: Secondary | ICD-10-CM

## 2017-12-22 DIAGNOSIS — Z853 Personal history of malignant neoplasm of breast: Secondary | ICD-10-CM

## 2017-12-22 DIAGNOSIS — F330 Major depressive disorder, recurrent, mild: Secondary | ICD-10-CM | POA: Diagnosis not present

## 2017-12-22 DIAGNOSIS — F419 Anxiety disorder, unspecified: Secondary | ICD-10-CM

## 2017-12-22 DIAGNOSIS — R69 Illness, unspecified: Secondary | ICD-10-CM | POA: Diagnosis not present

## 2017-12-22 MED ORDER — VENLAFAXINE HCL ER 150 MG PO CP24: ORAL_CAPSULE | ORAL | 0 refills | Status: DC

## 2017-12-22 MED ORDER — VENLAFAXINE HCL ER 37.5 MG PO CP24: ORAL_CAPSULE | ORAL | 0 refills | Status: DC

## 2017-12-22 NOTE — Progress Notes (Signed)
Psychiatric Initial Adult Assessment   Patient Identification: Jean Nash MRN:  [id number] Date of Evaluation:  12/22/2017 Referral Source: [name], MD Chief Complaint:  "I cannot leave my husband" Chief Complaint    Depression; Psychiatric Evaluation     Visit Diagnosis:    ICD-10-CM   1. MDD (major depressive disorder), recurrent episode, mild (HCC) F33.0     History of Present Illness:   Jean Nash is a 77 y.o. year old female with a history of depression, anxiety, breast cancer s/p mastectomy, who is referred for depression.    Patient states that she is here for depression. She talks with her daughter in [place], who is a Engineer, water,  who recommended to come here.   She believes most of the issues are secondary to her physical health.  She had suffered from breast cancer since 2014.  She just received radiation.  She states that it was lucky that she did know the diagnosis of breast cancer is not a death sentence.  She talks about her family members who suffered from breast cancer, and lived long.  She states that she has went through many surgeries including mastectomy.  She states that she has great intimate relationship with her husband, while she later states that she feels "incomplete" as a woman.  She has been married for 66 years after divorce.  She states that although she enjoys knitting group, she does not want to leave her husband at home as she enjoys being with him so much.  She tends to be crying especially at night, worried about what would happen if she becomes lonely.  She also imagines about him going to assisted living facility.  These thoughts make her feel very anxious and upset.  She would like to make some medication change for depression.   She reports insomnia, although it has been improving after she started nasal CPAP.  She has good energy and motivation.  She has fair concentration.  She denies SI.  She denies anxiety. She denies alcohol use or  drug use   Wt Readings from Last 3 Encounters:  12/22/17 169 lb (76.7 kg)  11/07/17 172 lb 11.2 oz (78.3 kg)  09/19/17 177 lb 9.6 oz (80.6 kg)    Associated Signs/Symptoms: Depression Symptoms:  insomnia, (Hypo) Manic Symptoms:  denies decreased need for sleep, euphoria Anxiety Symptoms:  denies Psychotic Symptoms:  Hallucinations: Auditory of somebody speaking for couple of months, denies paranoia, VH PTSD Symptoms: NA  Past Psychiatric History:  Outpatient: denies Psychiatry admission: denies  Previous suicide attempt: denies Past trials of medication: citalopram History of violence:   Previous Psychotropic Medications: Yes   Substance Abuse History in the last 12 months:  No.  Consequences of Substance Abuse: NA  Past Medical History:  Past Medical History:  Diagnosis Date  . Anxiety    PHOBIAS  . Arthritis   . Breast cancer ([place]) 08/08/13   right LOQ  . Cataract   . Chronic insomnia   . Cluster headaches    history of migraines / NONE FOR SEVERAL YRS  . Depression   . Fibromyalgia   . GERD (gastroesophageal reflux disease)   . H/O hiatal hernia   . History of colonic polyps   . History of transfusion 08/30/2013  . Hx of radiation therapy 10/29/13- 12/14/13   right chest wall 5040 cGy 28 sessions, right supraclavicular/axillary region 5040 cGy 28 sessions, right chest wall boost 1000 cGy 5 sessions  . Hypothyroidism   . Irritable  bowel syndrome   . Kidney stone   . Lymphedema    RT ARM - WEARS SLEEVE  . Macular degeneration    hole/right eye  . Osteopenia   . Other abnormal glucose   . Other and unspecified hyperlipidemia   . Pain in joint, shoulder region   . Pneumonia 3295,1884  . Sleep apnea    USES C-PAP  . Stress incontinence, female     Past Surgical History:  Procedure Laterality Date  . ABDOMINAL HYSTERECTOMY    . APPENDECTOMY    . BILATERAL TOTAL MASTECTOMY WITH AXILLARY LYMPH NODE DISSECTION  08/30/2013   Dr Barry Dienes  . BREAST CYST  ASPIRATION     9 cysts  . CATARACT EXTRACTION, BILATERAL  2005/2007  . CHOLECYSTECTOMY    . COLONOSCOPY    . EVACUATION BREAST HEMATOMA Left 08/31/2013   Procedure: EVACUATION HEMATOMA BREAST;  Surgeon: Stark Klein, MD;  Location: [facility];  Service: General;  Laterality: Left;  . EYE SURGERY     to repair macular hole  . FOOT ARTHROPLASTY     lt   . GANGLION CYST EXCISION     rt foot  . HEMORRHOID SURGERY     03/1993  . JOINT REPLACEMENT  03/15/11   left knee replacement  . KNEE ARTHROSCOPY     /partial knee 2016/left knee 2012  . MASS EXCISION  11/04/2011   Procedure: EXCISION MASS;  Surgeon: Cammie Sickle., MD;  Location: [facility];  Service: Orthopedics;  Laterality: Right;  excisional biopsy right ulna mass  . MASTECTOMY W/ SENTINEL NODE BIOPSY Right 08/30/2013   Procedure: RIGHT  AXILLARY SENTINEL LYMPH NODE BIOPSY; Right Axillary Node Disection;  Surgeon: Stark Klein, MD;  Location: [facility];  Service: General;  Laterality: Right;  Right side nuc med 7:00   . PARTIAL KNEE ARTHROPLASTY Right 11/03/2015   Procedure: RIGHT KNEE MEDIAL UNICOMPARTMENTAL ARTHROPLASTY ;  Surgeon: Gaynelle Arabian, MD;  Location: [facility];  Service: Orthopedics;  Laterality: Right;  . SIMPLE MASTECTOMY WITH AXILLARY SENTINEL NODE BIOPSY Left 08/30/2013   Procedure: Bilateral Breast Mastectomy ;  Surgeon: Stark Klein, MD;  Location: [facility] [place];  Service: General;  Laterality: Left;  . skin tags removed     breast, panty line, neckline  . TOE SURGERY     preventative crossover toe surg/right foot  . TOE SURGERY  2009   left foot/screw  in 2nd toe  . TONSILLECTOMY    . UPPER GASTROINTESTINAL ENDOSCOPY      Family Psychiatric History:  denies  Family History:  Family History  Problem Relation Age of Onset  . Stroke Mother        died age 48  . Diabetes Mother   . Breast cancer Mother 27  . Breast cancer Sister 63  . Breast cancer Paternal Aunt 30  . Diabetes Maternal Grandfather    . Breast cancer Paternal Grandmother 71  . Breast cancer Paternal Aunt        dx in her 35s  . Cancer Maternal Grandmother        intra-abdominal cancer  . Brain cancer Maternal Uncle 8  . Brain cancer Cousin 59       maternal cousin  . Brain cancer Cousin 20       paternal cousin  . Colon cancer Neg Hx     Social History:   Social History   Socioeconomic History  . Marital status: Married    Spouse name: None  .  Number of children: 3  . Years of education: None  . Highest education level: None  Social Needs  . Financial resource strain: None  . Food insecurity - worry: None  . Food insecurity - inability: None  . Transportation needs - medical: None  . Transportation needs - non-medical: None  Occupational History  . Occupation: retired bookkeeper  Tobacco Use  . Smoking status: Former Smoker    Packs/day: 0.10    Years: 2.00    Pack years: 0.20    Types: Cigarettes    Start date: 11/16/1959    Last attempt to quit: 11/15/1960    Years since quitting: 57.1  . Smokeless tobacco: Never Used  Substance and Sexual Activity  . Alcohol use: No    Alcohol/week: 0.0 oz  . Drug use: No  . Sexual activity: None    Comment: menarche age 39, fist live birth 65, P 3, hysterectomy age 89, no HRT, BCP 2 yrs  Other Topics Concern  . None  Social History Narrative   Occupation:  Retired Radiation protection practitioner    Married with 3 grown children      Never Smoked     Alcohol use-no         Additional Social History:  Married for 53 years, three children  She was born and grew up in [place], moved to [place] in 2006 Work: retired, used to work as Clinical cytogeneticist until 2006  Allergies:  No Known Allergies  Metabolic Disorder Labs: Lab Results  Component Value Date   HGBA1C 6.0 07/14/2017   MPG 126 (H) 08/27/2013   MPG 117 (H) 06/22/2013   Lab Results  Component Value Date   PROLACTIN 3.9 07/22/2009   Lab Results  Component Value Date   CHOL 174 07/14/2017   TRIG 141.0 07/14/2017   HDL  41.00 07/14/2017   CHOLHDL 4 07/14/2017   VLDL 28.2 07/14/2017   LDLCALC 105 (H) 07/14/2017   LDLCALC 131 (H) 04/30/2015     Current Medications: Current Outpatient Medications  Medication Sig Dispense Refill  . Biotin 10000 MCG TBDP Take by mouth.    . Calcium Carb-Cholecalciferol (CALCIUM-VITAMIN D) 500-400 MG-UNIT TABS Take by mouth 2 (two) times daily.     . cholecalciferol (VITAMIN D) 1000 units tablet Take 2,000 Units by mouth daily.    Marland Kitchen co-enzyme Q-10 30 MG capsule Take 300 mg by mouth daily.     . diclofenac sodium (VOLTAREN) 1 % GEL as needed.     Marland Kitchen esomeprazole (NEXIUM) 40 MG capsule Take 1 capsule by mouth  daily 90 capsule 1  . levothyroxine (SYNTHROID, LEVOTHROID) 100 MCG tablet Take 1 tablet (100 mcg total) daily before breakfast by mouth. Take one pill in middle of night 90 tablet 3  . LUTEIN PO Take by mouth.    . Melatonin 3 MG CAPS Take by mouth at bedtime.    . meloxicam (MOBIC) 7.5 MG tablet Take 1 tablet (7.5 mg total) daily by mouth. 90 tablet 1  . methocarbamol (ROBAXIN) 500 MG tablet Take 500 mg by mouth every 6 (six) hours as needed for muscle spasms.    . methylPREDNISolone (MEDROL DOSEPAK) 4 MG TBPK tablet Use per package directions 21 tablet 0  . omega-3 acid ethyl esters (LOVAZA) 1 g capsule Take 1 capsule (1 g total) 2 (two) times daily by mouth. 180 capsule 3  . oxyCODONE-acetaminophen (PERCOCET/ROXICET) 5-325 MG tablet Take 1 tablet by mouth every 8 (eight) hours as needed for severe pain. 8 tablet  0  . polyethylene glycol (MIRALAX / GLYCOLAX) packet Take 17 g by mouth daily.    . predniSONE (DELTASONE) 10 MG tablet 40 mg x 3 days, 20 mg x 3 days, 10 mg x 3 days 21 tablet 0  . pregabalin (LYRICA) 75 MG capsule Take 1 capsule (75 mg total) 2 (two) times daily by mouth. 180 capsule 0  . saccharomyces boulardii (FLORASTOR) 250 MG capsule Take 250 mg by mouth daily.    . simvastatin (ZOCOR) 10 MG tablet TAKE 1 TABLET BY MOUTH AT BEDTIME 90 tablet 1  .  tamoxifen (NOLVADEX) 20 MG tablet Take 1 tablet (20 mg total) daily by mouth. 90 tablet 4  . traMADol (ULTRAM) 50 MG tablet Take 1-2 tablets (50-100 mg total) by mouth every 6 (six) hours as needed (mild pain). 80 tablet 1  . venlafaxine XR (EFFEXOR-XR) 150 MG 24 hr capsule 150 mg daily after completing 75 mg daily 30 capsule 0  . venlafaxine XR (EFFEXOR-XR) 37.5 MG 24 hr capsule Start 37.5 mg daily for one week, 75 mg daily for one week 30 capsule 0   No current facility-administered medications for this visit.     Neurologic: Headache: No Seizure: No Paresthesias:No  Musculoskeletal: Strength & Muscle Tone: within normal limits Gait & Station: normal Patient leans: N/A  Psychiatric Specialty Exam: Review of Systems  Psychiatric/Behavioral: Positive for depression. Negative for hallucinations, memory loss, substance abuse and suicidal ideas. The patient is nervous/anxious and has insomnia.   All other systems reviewed and are negative.   Blood pressure 117/73, pulse 66, height 4\' 11"  (1.499 m), weight 169 lb (76.7 kg), SpO2 96 %.Body mass index is 34.13 kg/m.  General Appearance: Fairly Groomed  Eye Contact:  Good  Speech:  Clear and Coherent  Volume:  Normal  Mood:  Depressed  Affect:  Appropriate, Congruent, Tearful and reactive  Thought Process:  Coherent and Goal Directed  Orientation:  Full (Time, Place, and Person)  Thought Content:  Logical  Suicidal Thoughts:  No  Homicidal Thoughts:  No  Memory:  Immediate;   Good Recent;   Good Remote;   Good  Judgement:  Good  Insight:  Fair  Psychomotor Activity:  Normal  Concentration:  Concentration: Good and Attention Span: Good  Recall:  Good  Fund of Knowledge:Good  Language: Good  Akathisia:  No  Handed:  Right  AIMS (if indicated):  N/A  Assets:  Communication Skills Desire for Improvement  ADL's:  Intact  Cognition: WNL  Sleep:  fair   Assessment Jean Nash is a 77 y.o. year old female with a history of  depression, anxiety, breast cancer s/p mastectomy, who is referred for depression.   # MDD, mild, recurrent without psychotic features Patient endorses mild neurovegetative symptoms over several months.  Psychosocial stressors including health, and demoralization in the setting of aging.  Will switch from citalopram to Effexor to target neurovegetative symptoms. Chose this medication given it has less interaction with tamoxifen. Discussed risk of hypertension. Validated her grief.  Discussed cognitive diffusion.  She will greatly benefit from supportive therapy/CBT; will make a referral.   Plan 1. Decrease citalopram 10 mg daily for one week, then discontinue 2. Start Effexor 37.5 mg daily for one week, then 75 mg daily for one week, then 150 mg daily 3. Return to clinic in one month for 30 mins 4. Referral to therapy Maryanna Shape)  The patient demonstrates the following risk factors for suicide: Chronic risk factors for suicide include: psychiatric  disorder of depression. Acute risk factors for suicide include: N/A. Protective factors for this patient include: positive social support, coping skills and hope for the future. Considering these factors, the overall suicide risk at this point appears to be low. Patient is appropriate for outpatient follow up.   Treatment Plan Summary: Plan as above   Norman Clay, MD 2/7/20194:36 PM

## 2017-12-22 NOTE — Patient Instructions (Signed)
1. Decrease citalopram 10 mg daily for one week, then discontinue 2. Start Effexor 37.5 mg daily for one week, then 75 mg daily for one week, then 150 mg daily 3. Return to clinic in one month for 30 mins

## 2018-01-02 ENCOUNTER — Telehealth (HOSPITAL_COMMUNITY): Payer: Self-pay | Admitting: *Deleted

## 2018-01-02 ENCOUNTER — Telehealth: Payer: Self-pay | Admitting: Adult Health

## 2018-01-02 ENCOUNTER — Other Ambulatory Visit (HOSPITAL_COMMUNITY): Payer: Self-pay | Admitting: Psychiatry

## 2018-01-02 MED ORDER — VENLAFAXINE HCL ER 150 MG PO CP24: 150.0000 mg | ORAL_CAPSULE | Freq: Every day | ORAL | 0 refills | Status: DC

## 2018-01-02 NOTE — Telephone Encounter (Signed)
Copied from [facility] [id number]. Topic: Referral - Request >> Jan 02, 2018  8:20 AM Boyd Kerbs wrote: CRM for notification. See Telephone encounter for:   01/02/18. Reason for CRM: She went to psychiatrist in [place] and was told to see a psychologist in [place], She wants a Radiographer, therapeutic.

## 2018-01-02 NOTE — Telephone Encounter (Signed)
noted 

## 2018-01-02 NOTE — Telephone Encounter (Signed)
Dr Modesta Messing Patient's husband called stating that patient would be out of medication  X 1 week before next appointment  on 01/26/18. I spoke with patient & the miss up was they didn't realize there was a effexor  150 mg available @ [facility]  for pick-up. I spoke with the Rx & script is there on hold.

## 2018-01-03 ENCOUNTER — Other Ambulatory Visit: Payer: Self-pay | Admitting: Adult Health

## 2018-01-03 DIAGNOSIS — F419 Anxiety disorder, unspecified: Secondary | ICD-10-CM

## 2018-01-03 NOTE — Telephone Encounter (Signed)
Assessment Jean Nash is a 77 y.o. year old female with a history of depression, anxiety, breast cancer s/p mastectomy, who is referred for depression.   # MDD, mild, recurrent without psychotic features Patient endorses mild neurovegetative symptoms over several months.  Psychosocial stressors including health, and demoralization in the setting of aging.  Will switch from citalopram to Effexor to target neurovegetative symptoms. Chose this medication given it has less interaction with tamoxifen. Discussed risk of hypertension. Validated her grief.  Discussed cognitive diffusion.  She will greatly benefit from supportive therapy/CBT; will make a referral.   Plan 1. Decrease citalopram 10 mg daily for one week, then discontinue 2. Start Effexor 37.5 mg daily for one week, then 75 mg daily for one week, then 150 mg daily 3. Return to clinic in one month for 30 mins 4. Referral to therapy Maryanna Shape)  Tommi Rumps, unsure what referral to place for pt.  No referral was done for the pt.  Please advise.

## 2018-01-06 ENCOUNTER — Encounter: Payer: Self-pay | Admitting: Adult Health

## 2018-01-24 ENCOUNTER — Encounter: Payer: Self-pay | Admitting: Adult Health

## 2018-01-24 ENCOUNTER — Ambulatory Visit (INDEPENDENT_AMBULATORY_CARE_PROVIDER_SITE_OTHER): Payer: Medicare HMO | Admitting: Adult Health

## 2018-01-24 VITALS — BP 118/70 | Temp 98.1°F | Wt 164.0 lb

## 2018-01-24 DIAGNOSIS — J181 Lobar pneumonia, unspecified organism: Secondary | ICD-10-CM

## 2018-01-24 DIAGNOSIS — J189 Pneumonia, unspecified organism: Secondary | ICD-10-CM

## 2018-01-24 MED ORDER — DOXYCYCLINE HYCLATE 100 MG PO CAPS: 100.0000 mg | ORAL_CAPSULE | Freq: Two times a day (BID) | ORAL | 0 refills | Status: DC

## 2018-01-24 NOTE — Progress Notes (Signed)
Subjective:    [name], female    DOB: 25-Dec-1940, 77 y.o.   MRN: [id number]  Presents with 4 day history of sneezing, productive cough with clear sputum, nasal congestion with clear drainage, sore throat., left ear pain, weakness and fatigue.  She denies fever and chills.  She tried OTC allegra and Astelin nasal spray and eye drops yesterday without relief.  She is unable to take a deep breath without coughing.  She reports that this morning her husband had difficulty waking her and once he did that she had difficulty walking.  He has to max assist her for the first 2 hours after waking.  She was weak  And unable to stand without assistance and was stumbling into the walls.  This has improved as the day has progressed but she is still weak and stumbled in the parking lot but is able to ambulate unassisted.    Cough  Associated symptoms include ear pain, rhinorrhea and a sore throat. Pertinent negatives include no chills, fever, postnasal drip, shortness of breath or wheezing.   Review of Systems  Constitutional: Positive for appetite change and fatigue. Negative for chills and fever.  HENT: Positive for congestion, ear pain, rhinorrhea, sinus pressure, sinus pain, sneezing and sore throat. Negative for postnasal drip and trouble swallowing.   Respiratory: Positive for cough. Negative for shortness of breath and wheezing.   Cardiovascular: Negative.   Gastrointestinal: Negative for diarrhea.   Past Medical History:  Diagnosis Date  . Anxiety    PHOBIAS  . Arthritis   . Breast cancer ([place]) 08/08/13   right LOQ  . Cataract   . Chronic insomnia   . Cluster headaches    history of migraines / NONE FOR SEVERAL YRS  . Depression   . Fibromyalgia   . GERD (gastroesophageal reflux disease)   . H/O hiatal hernia   . History of colonic polyps   . History of transfusion 08/30/2013  . Hx of radiation therapy 10/29/13- 12/14/13   right chest wall 5040 cGy 28 sessions, right  supraclavicular/axillary region 5040 cGy 28 sessions, right chest wall boost 1000 cGy 5 sessions  . Hypothyroidism   . Irritable bowel syndrome   . Kidney stone   . Lymphedema    RT ARM - WEARS SLEEVE  . Macular degeneration    hole/right eye  . Osteopenia   . Other abnormal glucose   . Other and unspecified hyperlipidemia   . Pain in joint, shoulder region   . Pneumonia 4097,3532  . Sleep apnea    USES C-PAP  . Stress incontinence, female     Social History   Socioeconomic History  . Marital status: Married    Spouse name: Not on file  . Number of children: 3  . Years of education: Not on file  . Highest education level: Not on file  Social Needs  . Financial resource strain: Not on file  . Food insecurity - worry: Not on file  . Food insecurity - inability: Not on file  . Transportation needs - medical: Not on file  . Transportation needs - non-medical: Not on file  Occupational History  . Occupation: retired bookkeeper  Tobacco Use  . Smoking status: Former Smoker    Packs/day: 0.10    Years: 2.00    Pack years: 0.20    Types: Cigarettes    Start date: 11/16/1959    Last attempt to quit: 11/15/1960    Years since quitting: 57.2  .  Smokeless tobacco: Never Used  Substance and Sexual Activity  . Alcohol use: No    Alcohol/week: 0.0 oz  . Drug use: No  . Sexual activity: Not on file    Comment: menarche age 62, fist live birth 72, P 3, hysterectomy age 21, no HRT, BCP 2 yrs  Other Topics Concern  . 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 ABDOMINAL HYSTERECTOMY    . APPENDECTOMY    . BILATERAL TOTAL MASTECTOMY WITH AXILLARY LYMPH NODE DISSECTION  08/30/2013   Dr Barry Dienes  . BREAST CYST ASPIRATION     9 cysts  . CATARACT EXTRACTION, BILATERAL  2005/2007  . CHOLECYSTECTOMY    . COLONOSCOPY    .  EVACUATION BREAST HEMATOMA Left 08/31/2013   Procedure: EVACUATION HEMATOMA BREAST;  Surgeon: Stark Klein, MD;  Location: [facility];  Service: General;  Laterality: Left;  . EYE SURGERY     to repair macular hole  . FOOT ARTHROPLASTY     lt   . GANGLION CYST EXCISION     rt foot  . HEMORRHOID SURGERY     03/1993  . JOINT REPLACEMENT  03/15/11   left knee replacement  . KNEE ARTHROSCOPY     /partial knee 2016/left knee 2012  . MASS EXCISION  11/04/2011   Procedure: EXCISION MASS;  Surgeon: Cammie Sickle., MD;  Location: [facility];  Service: Orthopedics;  Laterality: Right;  excisional biopsy right ulna mass  . MASTECTOMY W/ SENTINEL NODE BIOPSY Right 08/30/2013   Procedure: RIGHT  AXILLARY SENTINEL LYMPH NODE BIOPSY; Right Axillary Node Disection;  Surgeon: Stark Klein, MD;  Location: [facility];  Service: General;  Laterality: Right;  Right side nuc med 7:00   . PARTIAL KNEE ARTHROPLASTY Right 11/03/2015   Procedure: RIGHT KNEE MEDIAL UNICOMPARTMENTAL ARTHROPLASTY ;  Surgeon: Gaynelle Arabian, MD;  Location: [facility];  Service: Orthopedics;  Laterality: Right;  . SIMPLE MASTECTOMY WITH AXILLARY SENTINEL NODE BIOPSY Left 08/30/2013   Procedure: Bilateral Breast Mastectomy ;  Surgeon: Stark Klein, MD;  Location: [facility];  Service: General;  Laterality: Left;  . skin tags removed     breast, panty line, neckline  . TOE SURGERY     preventative crossover toe surg/right foot  . TOE SURGERY  2009   left foot/screw  in 2nd toe  . TONSILLECTOMY    . UPPER GASTROINTESTINAL ENDOSCOPY      Family History  Problem Relation Age of Onset  . Stroke Mother        died age 56  . Diabetes Mother   . Breast cancer Mother 59  . Breast cancer Sister 62  . Breast cancer Paternal Aunt 80  . Diabetes Maternal Grandfather   . Breast cancer Paternal Grandmother 19  . Breast cancer Paternal Aunt        dx in her 72s  . Cancer Maternal Grandmother        intra-abdominal cancer  . Brain  cancer Maternal Uncle 8  . Brain cancer Cousin 31       maternal cousin  . Brain cancer Cousin 20       paternal cousin  . Colon cancer Neg Hx     No Known Allergies  Current Outpatient  Medications on File Prior to Visit  Medication Sig Dispense Refill  . Calcium Carb-Cholecalciferol (CALCIUM-VITAMIN D) 500-400 MG-UNIT TABS Take by mouth 2 (two) times daily.     . cholecalciferol (VITAMIN D) 1000 units tablet Take 2,000 Units by mouth daily.    Marland Kitchen co-enzyme Q-10 30 MG capsule Take 300 mg by mouth daily.     . diclofenac sodium (VOLTAREN) 1 % GEL as needed.     Marland Kitchen esomeprazole (NEXIUM) 40 MG capsule Take 1 capsule by mouth  daily 90 capsule 1  . levothyroxine (SYNTHROID, LEVOTHROID) 100 MCG tablet Take 1 tablet (100 mcg total) daily before breakfast by mouth. Take one pill in middle of night 90 tablet 3  . LUTEIN PO Take by mouth.    . Melatonin 3 MG CAPS Take by mouth at bedtime.    . meloxicam (MOBIC) 7.5 MG tablet Take 1 tablet (7.5 mg total) daily by mouth. 90 tablet 1  . methocarbamol (ROBAXIN) 500 MG tablet Take 500 mg by mouth every 6 (six) hours as needed for muscle spasms.    Marland Kitchen omega-3 acid ethyl esters (LOVAZA) 1 g capsule Take 1 capsule (1 g total) 2 (two) times daily by mouth. 180 capsule 3  . oxyCODONE-acetaminophen (PERCOCET/ROXICET) 5-325 MG tablet Take 1 tablet by mouth every 8 (eight) hours as needed for severe pain. 8 tablet 0  . polyethylene glycol (MIRALAX / GLYCOLAX) packet Take 17 g by mouth daily.    . pregabalin (LYRICA) 75 MG capsule Take 1 capsule (75 mg total) 2 (two) times daily by mouth. 180 capsule 0  . saccharomyces boulardii (FLORASTOR) 250 MG capsule Take 250 mg by mouth daily.    . simvastatin (ZOCOR) 10 MG tablet TAKE 1 TABLET BY MOUTH AT BEDTIME 90 tablet 1  . tamoxifen (NOLVADEX) 20 MG tablet Take 1 tablet (20 mg total) daily by mouth. 90 tablet 4  . traMADol (ULTRAM) 50 MG tablet Take 1-2 tablets (50-100 mg total) by mouth every 6 (six) hours as needed  (mild pain). 80 tablet 1  . venlafaxine XR (EFFEXOR-XR) 150 MG 24 hr capsule Take 1 capsule (150 mg total) by mouth daily. 30 capsule 0   No current facility-administered medications on file prior to visit.     BP 118/70 (BP Location: Left Arm)   Temp 98.1 F (36.7 C) (Oral)   Wt 164 lb (74.4 kg)   BMI 33.12 kg/m      Objective:   Physical Exam  Constitutional: She is oriented to person, place, and time. She appears well-developed and well-nourished.  Ill-apearing  HENT:  Right Ear: Tympanic membrane normal.  Left Ear: Tympanic membrane normal.  Nose: Mucosal edema and rhinorrhea present. Right sinus exhibits no maxillary sinus tenderness and no frontal sinus tenderness. Left sinus exhibits no maxillary sinus tenderness and no frontal sinus tenderness.  Bilateral inferior turbinates are erythematous.   Eyes: Right eye exhibits no discharge. Left eye exhibits no discharge.  Cardiovascular: Normal rate, regular rhythm and normal heart sounds. Exam reveals no gallop and no friction rub.  No murmur heard. Pulmonary/Chest: Effort normal. No accessory muscle usage. No tachypnea and no bradypnea. She has decreased breath sounds in the right lower field. She has wheezes in the right middle field. She has rales in the right upper field.  Unable to get a deep breath without coughing. (+) egophony in the right upper lobe.  Dullness to percussion in the 2nd and  3rd intercostal space on the right in comparison to the  left.   Lymphadenopathy:    She has no cervical adenopathy.  Neurological: She is alert and oriented to person, place, and time.  Skin: She is not diaphoretic.  Nursing note and vitals reviewed.     Assessment & Plan:  1. Pneumonia of right upper lobe due to infectious organism [facility]) Will treat for presumed pneumonia.  Will notify of results.  Encourage fluids.  Tylenol or Motrin for fever or discomfort.  - DG Chest 2 View; Future - CBC with Differential/Platelet;  Future  Follow up information given to patient. May need follow up CXR in 4 weeks if CXR shows pneumonia.   Deniesha Stenglein C Wane Mollett BSN RN NP student

## 2018-01-24 NOTE — Progress Notes (Deleted)
BH MD/PA/NP OP Progress Note  01/24/2018 2:58 PM Jean Nash  MRN:  [id number]  Chief Complaint:  HPI: *** Visit Diagnosis: No diagnosis found.  Past Psychiatric History:  I have reviewed the patient's psychiatry history in detail and updated the patient record. Outpatient: denies Psychiatry admission: denies  Previous suicide attempt: denies Past trials of medication: citalopram History of violence:    Past Medical History:  Past Medical History:  Diagnosis Date  . Anxiety    PHOBIAS  . Arthritis   . Breast cancer ([place]) 08/08/13   right LOQ  . Cataract   . Chronic insomnia   . Cluster headaches    history of migraines / NONE FOR SEVERAL YRS  . Depression   . Fibromyalgia   . GERD (gastroesophageal reflux disease)   . H/O hiatal hernia   . History of colonic polyps   . History of transfusion 08/30/2013  . Hx of radiation therapy 10/29/13- 12/14/13   right chest wall 5040 cGy 28 sessions, right supraclavicular/axillary region 5040 cGy 28 sessions, right chest wall boost 1000 cGy 5 sessions  . Hypothyroidism   . Irritable bowel syndrome   . Kidney stone   . Lymphedema    RT ARM - WEARS SLEEVE  . Macular degeneration    hole/right eye  . Osteopenia   . Other abnormal glucose   . Other and unspecified hyperlipidemia   . Pain in joint, shoulder region   . Pneumonia 3474,2595  . Sleep apnea    USES C-PAP  . Stress incontinence, female     Past Surgical History:  Procedure Laterality Date  . ABDOMINAL HYSTERECTOMY    . APPENDECTOMY    . BILATERAL TOTAL MASTECTOMY WITH AXILLARY LYMPH NODE DISSECTION  08/30/2013   Dr Barry Dienes  . BREAST CYST ASPIRATION     9 cysts  . CATARACT EXTRACTION, BILATERAL  2005/2007  . CHOLECYSTECTOMY    . COLONOSCOPY    . EVACUATION BREAST HEMATOMA Left 08/31/2013   Procedure: EVACUATION HEMATOMA BREAST;  Surgeon: Stark Klein, MD;  Location: [facility];  Service: General;  Laterality: Left;  . EYE SURGERY     to repair macular hole  .  FOOT ARTHROPLASTY     lt   . GANGLION CYST EXCISION     rt foot  . HEMORRHOID SURGERY     03/1993  . JOINT REPLACEMENT  03/15/11   left knee replacement  . KNEE ARTHROSCOPY     /partial knee 2016/left knee 2012  . MASS EXCISION  11/04/2011   Procedure: EXCISION MASS;  Surgeon: Cammie Sickle., MD;  Location: [facility];  Service: Orthopedics;  Laterality: Right;  excisional biopsy right ulna mass  . MASTECTOMY W/ SENTINEL NODE BIOPSY Right 08/30/2013   Procedure: RIGHT  AXILLARY SENTINEL LYMPH NODE BIOPSY; Right Axillary Node Disection;  Surgeon: Stark Klein, MD;  Location: [facility];  Service: General;  Laterality: Right;  Right side nuc med 7:00   . PARTIAL KNEE ARTHROPLASTY Right 11/03/2015   Procedure: RIGHT KNEE MEDIAL UNICOMPARTMENTAL ARTHROPLASTY ;  Surgeon: Gaynelle Arabian, MD;  [facility];  Service: Orthopedics;  Laterality: Right;  . SIMPLE MASTECTOMY WITH AXILLARY SENTINEL NODE BIOPSY Left 08/30/2013   Procedure: Bilateral Breast Mastectomy ;  Surgeon: Stark Klein, MD;  Location: [facility];  Service: General;  Laterality: Left;  . skin tags removed     breast, panty line, neckline  . TOE SURGERY     preventative crossover toe surg/right foot  . TOE  SURGERY  2009   left foot/screw  in 2nd toe  . TONSILLECTOMY    . UPPER GASTROINTESTINAL ENDOSCOPY      Family Psychiatric History: I have reviewed the patient's family history in detail and updated the patient record.  Family History:  Family History  Problem Relation Age of Onset  . Stroke Mother        died age 58  . Diabetes Mother   . Breast cancer Mother 22  . Breast cancer Sister 56  . Breast cancer Paternal Aunt 23  . Diabetes Maternal Grandfather   . Breast cancer Paternal Grandmother 91  . Breast cancer Paternal Aunt        dx in her 78s  . Cancer Maternal Grandmother        intra-abdominal cancer  . Brain cancer Maternal Uncle 8  . Brain cancer Cousin 71       maternal cousin  . Brain  cancer Cousin 20       paternal cousin  . Colon cancer Neg Hx     Social History:  Social History   Socioeconomic History  . Marital status: Married    Spouse name: Not on file  . Number of children: 3  . Years of education: Not on file  . Highest education level: Not on file  Social Needs  . Financial resource strain: Not on file  . Food insecurity - worry: Not on file  . Food insecurity - inability: Not on file  . Transportation needs - medical: Not on file  . Transportation needs - non-medical: Not on file  Occupational History  . Occupation: retired bookkeeper  Tobacco Use  . Smoking status: Former Smoker    Packs/day: 0.10    Years: 2.00    Pack years: 0.20    Types: Cigarettes    Start date: 11/16/1959    Last attempt to quit: 11/15/1960    Years since quitting: 57.2  . Smokeless tobacco: Never Used  Substance and Sexual Activity  . Alcohol use: No    Alcohol/week: 0.0 oz  . Drug use: No  . Sexual activity: Not on file    Comment: menarche age 40, fist live birth 32, P 3, hysterectomy age 47, no HRT, BCP 2 yrs  Other Topics Concern  . Not on file  Social History Narrative   Occupation:  Retired Radiation protection practitioner    Married with 3 grown children      Never Smoked     Alcohol use-no        Married for 53 years, three children  She was born and grew up in [place], moved to [place] in 2006 Work: retired, used to work as Clinical cytogeneticist until 2006   Allergies: No Known Allergies  Metabolic Disorder Labs: Lab Results  Component Value Date   HGBA1C 6.0 07/14/2017   MPG 126 (H) 08/27/2013   MPG 117 (H) 06/22/2013   Lab Results  Component Value Date   PROLACTIN 3.9 07/22/2009   Lab Results  Component Value Date   CHOL 174 07/14/2017   TRIG 141.0 07/14/2017   HDL 41.00 07/14/2017   CHOLHDL 4 07/14/2017   VLDL 28.2 07/14/2017   LDLCALC 105 (H) 07/14/2017   LDLCALC 131 (H) 04/30/2015   Lab Results  Component Value Date   TSH 0.75 07/14/2017   TSH 3.29 08/16/2016     Therapeutic Level Labs: No results found for: LITHIUM No results found for: VALPROATE No components found for:  CBMZ  Current Medications:  Current Outpatient Medications  Medication Sig Dispense Refill  . Biotin 10000 MCG TBDP Take by mouth.    . Calcium Carb-Cholecalciferol (CALCIUM-VITAMIN D) 500-400 MG-UNIT TABS Take by mouth 2 (two) times daily.     . cholecalciferol (VITAMIN D) 1000 units tablet Take 2,000 Units by mouth daily.    Marland Kitchen co-enzyme Q-10 30 MG capsule Take 300 mg by mouth daily.     . diclofenac sodium (VOLTAREN) 1 % GEL as needed.     Marland Kitchen esomeprazole (NEXIUM) 40 MG capsule Take 1 capsule by mouth  daily 90 capsule 1  . levothyroxine (SYNTHROID, LEVOTHROID) 100 MCG tablet Take 1 tablet (100 mcg total) daily before breakfast by mouth. Take one pill in middle of night 90 tablet 3  . LUTEIN PO Take by mouth.    . Melatonin 3 MG CAPS Take by mouth at bedtime.    . meloxicam (MOBIC) 7.5 MG tablet Take 1 tablet (7.5 mg total) daily by mouth. 90 tablet 1  . methocarbamol (ROBAXIN) 500 MG tablet Take 500 mg by mouth every 6 (six) hours as needed for muscle spasms.    . methylPREDNISolone (MEDROL DOSEPAK) 4 MG TBPK tablet Use per package directions 21 tablet 0  . omega-3 acid ethyl esters (LOVAZA) 1 g capsule Take 1 capsule (1 g total) 2 (two) times daily by mouth. 180 capsule 3  . oxyCODONE-acetaminophen (PERCOCET/ROXICET) 5-325 MG tablet Take 1 tablet by mouth every 8 (eight) hours as needed for severe pain. 8 tablet 0  . polyethylene glycol (MIRALAX / GLYCOLAX) packet Take 17 g by mouth daily.    . predniSONE (DELTASONE) 10 MG tablet 40 mg x 3 days, 20 mg x 3 days, 10 mg x 3 days 21 tablet 0  . pregabalin (LYRICA) 75 MG capsule Take 1 capsule (75 mg total) 2 (two) times daily by mouth. 180 capsule 0  . saccharomyces boulardii (FLORASTOR) 250 MG capsule Take 250 mg by mouth daily.    . simvastatin (ZOCOR) 10 MG tablet TAKE 1 TABLET BY MOUTH AT BEDTIME 90 tablet 1  . tamoxifen  (NOLVADEX) 20 MG tablet Take 1 tablet (20 mg total) daily by mouth. 90 tablet 4  . traMADol (ULTRAM) 50 MG tablet Take 1-2 tablets (50-100 mg total) by mouth every 6 (six) hours as needed (mild pain). 80 tablet 1  . venlafaxine XR (EFFEXOR-XR) 150 MG 24 hr capsule Take 1 capsule (150 mg total) by mouth daily. 30 capsule 0   No current facility-administered medications for this visit.      Musculoskeletal: Strength & Muscle Tone: within normal limits Gait & Station: normal Patient leans: N/A  Psychiatric Specialty Exam: ROS  There were no vitals taken for this visit.There is no height or weight on file to calculate BMI.  General Appearance: Fairly Groomed  Eye Contact:  Good  Speech:  Clear and Coherent  Volume:  Normal  Mood:  {BHH MOOD:22306}  Affect:  {Affect (PAA):22687}  Thought Process:  Coherent and Goal Directed  Orientation:  Full (Time, Place, and Person)  Thought Content: Logical   Suicidal Thoughts:  {ST/HT (PAA):22692}  Homicidal Thoughts:  {ST/HT (PAA):22692}  Memory:  Immediate;   Good Recent;   Good Remote;   Good  Judgement:  {Judgement (PAA):22694}  Insight:  {Insight (PAA):22695}  Psychomotor Activity:  Normal  Concentration:  Concentration: Good and Attention Span: Good  Recall:  Good  Fund of Knowledge: Good  Language: Good  Akathisia:  No  Handed:  Right  AIMS (if  indicated): not done  Assets:  Communication Skills Desire for Improvement  ADL's:  Intact  Cognition: WNL  Sleep:  {BHH GOOD/FAIR/POOR:22877}   Screenings: PHQ2-9     Office Visit from 07/14/2017 in [place] at [facility] from 11/28/2015 in [place] at [facility] from 10/14/2014 in [place] at [facility] from 10/01/2013 in [place] at [facility] Total Score  0  0  1  0  PHQ-9 Total Score  5  No data  No data  No data       Assessment and Plan:  Jean Nash is a 77 y.o. year old female with a  history of depression, anxiety,  breast cancer s/p mastectomy , who presents for follow up appointment for No diagnosis found.  # MDD, mild, recurrent without psychotic features   Patient endorses mild neurovegetative symptoms over several months.  Psychosocial stressors including health, and demoralization in the setting of aging.  Will switch from citalopram to Effexor to target neurovegetative symptoms. Chose this medication given it has less interaction with tamoxifen. Discussed risk of hypertension. Validated her grief.  Discussed cognitive diffusion.  She will greatly benefit from supportive therapy/CBT; will make a referral.   Plan 1. Decrease citalopram 10 mg daily for one week, then discontinue 2. Start Effexor 37.5 mg daily for one week, then 75 mg daily for one week, then 150 mg daily 3. Return to clinic in one month for 30 mins 4. Referral to therapy Maryanna Shape)  The patient demonstrates the following risk factors for suicide: Chronic risk factors for suicide include: psychiatric disorder of depression. Acute risk factors for suicide include: N/A. Protective factors for this patient include: positive social support, coping skills and hope for the future. Considering these factors, the overall suicide risk at this point appears to be low. Patient is appropriate for outpatient follow up.     Norman Clay, MD 01/24/2018, 2:58 PM

## 2018-01-24 NOTE — Progress Notes (Signed)
Subjective:    [name], female    DOB: [birth year], 77 y.o.   MRN: [id number]  HPI 77 year old female who  has a past medical history of Anxiety, Arthritis, Breast cancer ([place]) (08/08/13), Cataract, Chronic insomnia, Cluster headaches, Depression, Fibromyalgia, GERD (gastroesophageal reflux disease), H/O hiatal hernia, History of colonic polyps, History of transfusion (08/30/2013), radiation therapy (10/29/13- 12/14/13), Hypothyroidism, Irritable bowel syndrome, Kidney stone, Lymphedema, Macular degeneration, Osteopenia, Other abnormal glucose, Other and unspecified hyperlipidemia, Pain in joint, shoulder region, Pneumonia (3536,1443), Sleep apnea, and Stress incontinence, female.   She presents for an acute issue.  She reports 4 days of productive cough with clear sputum, nasal congestion with clear Derrick drainage, sore throat, sneezing, left ear pain, with weakness and fatigue.  She denies any fevers or chills at home but in the exam room today she reports feeling as though her temperature is going up.  She has tried over-the-counter Allegra and Astelin nasal spray without relief, but she only used this for approximately 1 day.  This morning her husband had trouble waking her during night care and she reports difficulty being able to walk when she was awoken.  Her husband had to help her get out of bed and assist with her ADLs for the first 2 hours after being awake.  She continues to feel weak and off balance but is able to walk with a slow  gait.  Review of Systems See HPI   Past Medical History:  Diagnosis Date  . Anxiety    PHOBIAS  . Arthritis   . Breast cancer ([place]) 08/08/13   right LOQ  . Cataract   . Chronic insomnia   . Cluster headaches    history of migraines / NONE FOR SEVERAL YRS  . Depression   . Fibromyalgia   . GERD (gastroesophageal reflux disease)   . H/O hiatal hernia   . History of colonic polyps   . History of transfusion 08/30/2013  . Hx of  radiation therapy 10/29/13- 12/14/13   right chest wall 5040 cGy 28 sessions, right supraclavicular/axillary region 5040 cGy 28 sessions, right chest wall boost 1000 cGy 5 sessions  . Hypothyroidism   . Irritable bowel syndrome   . Kidney stone   . Lymphedema    RT ARM - WEARS SLEEVE  . Macular degeneration    hole/right eye  . Osteopenia   . Other abnormal glucose   . Other and unspecified hyperlipidemia   . Pain in joint, shoulder region   . Pneumonia 1540,0867  . Sleep apnea    USES C-PAP  . Stress incontinence, female     Social History   Socioeconomic History  . Marital status: Married    Spouse name: Not on file  . Number of children: 3  . Years of education: Not on file  . Highest education level: Not on file  Social Needs  . Financial resource strain: Not on file  . Food insecurity - worry: Not on file  . Food insecurity - inability: Not on file  . Transportation needs - medical: Not on file  . Transportation needs - non-medical: Not on file  Occupational History  . Occupation: retired bookkeeper  Tobacco Use  . Smoking status: Former Smoker    Packs/day: 0.10    Years: 2.00    Pack years: 0.20    Types: Cigarettes    Start date: 11/16/1959    Last attempt to quit: 11/15/1960    Years since quitting: 57.2  .  Smokeless tobacco: Never Used  Substance and Sexual Activity  . Alcohol use: No    Alcohol/week: 0.0 oz  . Drug use: No  . Sexual activity: Not on file    Comment: menarche age 60, fist live birth 20, P 3, hysterectomy age 24, no HRT, BCP 2 yrs  Other Topics Concern  . 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 ABDOMINAL HYSTERECTOMY    . APPENDECTOMY    . BILATERAL TOTAL MASTECTOMY WITH AXILLARY LYMPH NODE DISSECTION  08/30/2013   Dr Barry Dienes  . BREAST CYST ASPIRATION     9 cysts  .  CATARACT EXTRACTION, BILATERAL  2005/2007  . CHOLECYSTECTOMY    . COLONOSCOPY    . EVACUATION BREAST HEMATOMA Left 08/31/2013   Procedure: EVACUATION HEMATOMA BREAST;  Surgeon: Stark Klein, MD;  Location: [facility];  Service: General;  Laterality: Left;  . EYE SURGERY     to repair macular hole  . FOOT ARTHROPLASTY     lt   . GANGLION CYST EXCISION     rt foot  . HEMORRHOID SURGERY     03/1993  . JOINT REPLACEMENT  03/15/11   left knee replacement  . KNEE ARTHROSCOPY     /partial knee 2016/left knee 2012  . MASS EXCISION  11/04/2011   Procedure: EXCISION MASS;  Surgeon: Cammie Sickle., MD;  Location: [facility];  Service: Orthopedics;  Laterality: Right;  excisional biopsy right ulna mass  . MASTECTOMY W/ SENTINEL NODE BIOPSY Right 08/30/2013   Procedure: RIGHT  AXILLARY SENTINEL LYMPH NODE BIOPSY; Right Axillary Node Disection;  Surgeon: Stark Klein, MD;  Location: [facility];  Service: General;  Laterality: Right;  Right side nuc med 7:00   . PARTIAL KNEE ARTHROPLASTY Right 11/03/2015   Procedure: RIGHT KNEE MEDIAL UNICOMPARTMENTAL ARTHROPLASTY ;  Surgeon: Gaynelle Arabian, MD;  Location: [facility];  Service: Orthopedics;  Laterality: Right;  . SIMPLE MASTECTOMY WITH AXILLARY SENTINEL NODE BIOPSY Left 08/30/2013   Procedure: Bilateral Breast Mastectomy ;  Surgeon: Stark Klein, MD;  Location: [facility];  Service: General;  Laterality: Left;  . skin tags removed     breast, panty line, neckline  . TOE SURGERY     preventative crossover toe surg/right foot  . TOE SURGERY  2009   left foot/screw  in 2nd toe  . TONSILLECTOMY    . UPPER GASTROINTESTINAL ENDOSCOPY      Family History  Problem Relation Age of Onset  . Stroke Mother        died age 45  . Diabetes Mother   . Breast cancer Mother 59  . Breast cancer Sister 24  . Breast cancer Paternal Aunt 71  . Diabetes Maternal Grandfather   . Breast cancer Paternal Grandmother 82  . Breast cancer Paternal Aunt        dx in  her 11s  . Cancer Maternal Grandmother        intra-abdominal cancer  . Brain cancer Maternal Uncle 8  . Brain cancer Cousin 50       maternal cousin  . Brain cancer Cousin 20       paternal cousin  . Colon cancer Neg Hx     No Known Allergies  Current Outpatient  Medications on File Prior to Visit  Medication Sig Dispense Refill  . Calcium Carb-Cholecalciferol (CALCIUM-VITAMIN D) 500-400 MG-UNIT TABS Take by mouth 2 (two) times daily.     . cholecalciferol (VITAMIN D) 1000 units tablet Take 2,000 Units by mouth daily.    Marland Kitchen co-enzyme Q-10 30 MG capsule Take 300 mg by mouth daily.     . diclofenac sodium (VOLTAREN) 1 % GEL as needed.     Marland Kitchen esomeprazole (NEXIUM) 40 MG capsule Take 1 capsule by mouth  daily 90 capsule 1  . levothyroxine (SYNTHROID, LEVOTHROID) 100 MCG tablet Take 1 tablet (100 mcg total) daily before breakfast by mouth. Take one pill in middle of night 90 tablet 3  . LUTEIN PO Take by mouth.    . Melatonin 3 MG CAPS Take by mouth at bedtime.    . meloxicam (MOBIC) 7.5 MG tablet Take 1 tablet (7.5 mg total) daily by mouth. 90 tablet 1  . methocarbamol (ROBAXIN) 500 MG tablet Take 500 mg by mouth every 6 (six) hours as needed for muscle spasms.    Marland Kitchen omega-3 acid ethyl esters (LOVAZA) 1 g capsule Take 1 capsule (1 g total) 2 (two) times daily by mouth. 180 capsule 3  . oxyCODONE-acetaminophen (PERCOCET/ROXICET) 5-325 MG tablet Take 1 tablet by mouth every 8 (eight) hours as needed for severe pain. 8 tablet 0  . polyethylene glycol (MIRALAX / GLYCOLAX) packet Take 17 g by mouth daily.    . pregabalin (LYRICA) 75 MG capsule Take 1 capsule (75 mg total) 2 (two) times daily by mouth. 180 capsule 0  . saccharomyces boulardii (FLORASTOR) 250 MG capsule Take 250 mg by mouth daily.    . simvastatin (ZOCOR) 10 MG tablet TAKE 1 TABLET BY MOUTH AT BEDTIME 90 tablet 1  . tamoxifen (NOLVADEX) 20 MG tablet Take 1 tablet (20 mg total) daily by mouth. 90 tablet 4  . traMADol (ULTRAM) 50 MG  tablet Take 1-2 tablets (50-100 mg total) by mouth every 6 (six) hours as needed (mild pain). 80 tablet 1  . venlafaxine XR (EFFEXOR-XR) 150 MG 24 hr capsule Take 1 capsule (150 mg total) by mouth daily. 30 capsule 0   No current facility-administered medications on file prior to visit.     BP 118/70 (BP Location: Left Arm)   Temp 98.1 F (36.7 C) (Oral)   Wt 164 lb (74.4 kg)   BMI 33.12 kg/m       Objective:   Physical Exam  Constitutional: She is oriented to person, place, and time. She appears well-developed and well-nourished. She appears ill. No distress.  HENT:  Head: Atraumatic.  Right Ear: External ear normal.  Left Ear: External ear normal.  Nose: Mucosal edema and rhinorrhea present.  Mouth/Throat: Uvula is midline, oropharynx is clear and moist and mucous membranes are normal. No oropharyngeal exudate.  Eyes: Conjunctivae and EOM are normal. Pupils are equal, round, and reactive to light. Right eye exhibits no discharge. Left eye exhibits no discharge. No scleral icterus.  Neck: Normal range of motion. Neck supple. No thyromegaly present.  Pulmonary/Chest: Effort normal. She has rales in the right upper field and the right middle field.  He is unable to take a deep breath without coughing.  She did have slight dullness to percussion to anesthetic second and third to second and third intercostal space as compared to left side. + egophony on right upper lung field   Musculoskeletal:  Walks with a slow gait with the help of her husband.  She did appear off balance walking down the hall.  Needed assistance getting onto and off exam table  Lymphadenopathy:    She has no cervical adenopathy.  Neurological: She is alert and oriented to person, place, and time.  Skin: Skin is warm. She is diaphoretic.  Psychiatric: She has a normal mood and affect. Her behavior is normal. Judgment and thought content normal.  Nursing note and vitals reviewed.     Assessment & Plan:  1.  Pneumonia of right upper lobe due to infectious organism ([place]) -There is concern for pneumonia on the right upper lobe due to unknown organism.  We will start her on doxycycline 100 mg twice daily for 10 days.  They will get chest x-ray and blood work tomorrow.  We will follow-up once results are back - DG Chest 2 View; Future - CBC with Differential/Platelet; Future  [name], NP

## 2018-01-25 ENCOUNTER — Ambulatory Visit (INDEPENDENT_AMBULATORY_CARE_PROVIDER_SITE_OTHER)
Admission: RE | Admit: 2018-01-25 | Discharge: 2018-01-25 | Disposition: A | Discharge status: Home / Self Care | Payer: Medicare HMO | Source: Ambulatory Visit | Attending: Adult Health | Admitting: Adult Health

## 2018-01-25 ENCOUNTER — Other Ambulatory Visit (INDEPENDENT_AMBULATORY_CARE_PROVIDER_SITE_OTHER): Payer: Medicare HMO

## 2018-01-25 ENCOUNTER — Encounter: Payer: Self-pay | Admitting: Adult Health

## 2018-01-25 DIAGNOSIS — J181 Lobar pneumonia, unspecified organism: Secondary | ICD-10-CM

## 2018-01-25 DIAGNOSIS — J189 Pneumonia, unspecified organism: Secondary | ICD-10-CM

## 2018-01-25 DIAGNOSIS — R05 Cough: Secondary | ICD-10-CM | POA: Diagnosis not present

## 2018-01-25 LAB — CBC WITH DIFFERENTIAL/PLATELET
BASOS ABS: 0 10*3/uL (ref 0.0–0.1)
BASOS PCT: 0.5 % (ref 0.0–3.0)
Eosinophils Absolute: 0.2 10*3/uL (ref 0.0–0.7)
Eosinophils Relative: 2.9 % (ref 0.0–5.0)
HEMATOCRIT: 43.3 % (ref 36.0–46.0)
Hemoglobin: 14.8 g/dL (ref 12.0–15.0)
Lymphocytes Relative: 24.3 % (ref 12.0–46.0)
Lymphs Abs: 1.5 10*3/uL (ref 0.7–4.0)
MCHC: 34.1 g/dL (ref 30.0–36.0)
MCV: 90 fl (ref 78.0–100.0)
MONOS PCT: 9.8 % (ref 3.0–12.0)
Monocytes Absolute: 0.6 10*3/uL (ref 0.1–1.0)
NEUTROS ABS: 4 10*3/uL (ref 1.4–7.7)
NEUTROS PCT: 62.5 % (ref 43.0–77.0)
PLATELETS: 180 10*3/uL (ref 150.0–400.0)
RBC: 4.81 Mil/uL (ref 3.87–5.11)
RDW: 13.7 % (ref 11.5–15.5)
WBC: 6.4 10*3/uL (ref 4.0–10.5)

## 2018-01-26 ENCOUNTER — Encounter: Payer: Self-pay | Admitting: Adult Health

## 2018-01-26 ENCOUNTER — Ambulatory Visit (HOSPITAL_COMMUNITY): Payer: Medicare HMO | Admitting: Psychiatry

## 2018-01-30 ENCOUNTER — Encounter: Payer: Self-pay | Admitting: Family Medicine

## 2018-01-30 ENCOUNTER — Ambulatory Visit (INDEPENDENT_AMBULATORY_CARE_PROVIDER_SITE_OTHER): Payer: Medicare HMO | Admitting: Family Medicine

## 2018-01-30 VITALS — BP 138/80 | HR 84 | Temp 97.8°F | Wt 163.0 lb

## 2018-01-30 DIAGNOSIS — K5909 Other constipation: Secondary | ICD-10-CM | POA: Diagnosis not present

## 2018-01-30 MED ORDER — FLEET ENEMA 7-19 GM/118ML RE ENEM: 1.0000 | ENEMA | Freq: Once | RECTAL | 0 refills | Status: AC

## 2018-01-30 NOTE — Progress Notes (Signed)
Subjective:    [name], female    DOB: 09-06-1941, 77 y.o.   MRN: [id number]  Chief Complaint  Patient presents with  . Constipation  Pt accompanied by her husband.  HPI Patient was seen today for acute concern.  Pt endorses constipation.  States last BM was Friday.  Pt states she feels like she needs to go but nothing comes out.  Pt endorses abdominal pain.  Pt states when she was seen last wk her Miralax was stopped b/c she noticed some rectal leakage at night.  Pt has since tried to restart the miralax with no success.  Pt states she tried to have a BM this am, but she was only able to express a "small pebble".  Past Medical History:  Diagnosis Date  . Anxiety    PHOBIAS  . Arthritis   . Breast cancer ([place]) 08/08/13   right LOQ  . Cataract   . Chronic insomnia   . Cluster headaches    history of migraines / NONE FOR SEVERAL YRS  . Depression   . Fibromyalgia   . GERD (gastroesophageal reflux disease)   . H/O hiatal hernia   . History of colonic polyps   . History of transfusion 08/30/2013  . Hx of radiation therapy 10/29/13- 12/14/13   right chest wall 5040 cGy 28 sessions, right supraclavicular/axillary region 5040 cGy 28 sessions, right chest wall boost 1000 cGy 5 sessions  . Hypothyroidism   . Irritable bowel syndrome   . Kidney stone   . Lymphedema    RT ARM - WEARS SLEEVE  . Macular degeneration    hole/right eye  . Osteopenia   . Other abnormal glucose   . Other and unspecified hyperlipidemia   . Pain in joint, shoulder region   . Pneumonia 0175,1025  . Sleep apnea    USES C-PAP  . Stress incontinence, female     No Known Allergies  ROS General: Denies fever, chills, night sweats, changes in weight, changes in appetite HEENT: Denies headaches, ear pain, changes in vision, rhinorrhea, sore throat CV: Denies CP, palpitations, SOB, orthopnea Pulm: Denies SOB, cough, wheezing GI: Denies abdominal pain, nausea, vomiting, diarrhea  +constipation GU: Denies dysuria, hematuria, frequency, vaginal discharge Msk: Denies muscle cramps, joint pains Neuro: Denies weakness, numbness, tingling Skin: Denies rashes, bruising Psych: Denies depression, anxiety, hallucinations     Objective:    Blood pressure 138/80, pulse 84, temperature 97.8 F (36.6 C), temperature source Oral, weight 163 lb (73.9 kg), SpO2 94 %.   Gen. Pleasant, well-nourished, in no distress, normal affect  HEENT: /AT, face symmetric,  no scleral icterus, PERRLA, nares patent without drainage Lungs: no accessory muscle use, normal WOB Cardiovascular: RRR, no peripheral edema Abdomen: BS present, soft, NT/ND, no hepatosplenomegaly.  Stool noted during palpation. GU:  Normal appearing anus without lesions or hemorrhoids.  Normal rectal tone, hard thick stool noted in rectal vault.  Manual disimpaction performed, but a large amount of stool remained.  Chaperone present P.Jaimes, LPN. Neuro:  A&Ox3, CN II-XII intact, normal gait Skin:  Warm, no lesions/ rash   Wt Readings from Last 3 Encounters:  01/30/18 163 lb (73.9 kg)  01/24/18 164 lb (74.4 kg)  11/07/17 172 lb 11.2 oz (78.3 kg)    Lab Results  Component Value Date   WBC 6.4 01/25/2018   HGB 14.8 01/25/2018   HCT 43.3 01/25/2018   PLT 180.0 01/25/2018   GLUCOSE 132 09/01/2017   CHOL 174 07/14/2017   TRIG  141.0 07/14/2017   HDL 41.00 07/14/2017   LDLDIRECT 113.0 05/17/2016   LDLCALC 105 (H) 07/14/2017   ALT 32 09/01/2017   AST 20 09/01/2017   NA 140 09/01/2017   K 4.1 09/01/2017   CL 104 07/14/2017   CREATININE 0.8 09/01/2017   BUN 15.8 09/01/2017   CO2 24 09/01/2017   TSH 0.75 07/14/2017   INR 0.99 10/28/2015   HGBA1C 6.0 07/14/2017   MICROALBUR <0.7 05/17/2016    Assessment/Plan:  Other constipation  -informed consent obtained, manual disimpaction performed.  Pt tolerated procedure well, but stool still remained. - Plan: sodium phosphate (FLEET) 7-19 GM/118ML  ENEMA -Discussed increasing Miralax to BID.  Once effect achieved, discussed decreasing Miralax to QOD. -pt given handout -pt given RTC or ED precautions including increased abdominal pain, N/V, etc.  Pt advised to f/u in clinic tomorrow.  Grier Mitts, MD

## 2018-01-30 NOTE — Patient Instructions (Addendum)

## 2018-02-06 ENCOUNTER — Encounter: Payer: Self-pay | Admitting: Adult Health

## 2018-02-07 ENCOUNTER — Other Ambulatory Visit (HOSPITAL_COMMUNITY): Payer: Self-pay

## 2018-02-07 ENCOUNTER — Encounter: Payer: Self-pay | Admitting: Adult Health

## 2018-02-07 ENCOUNTER — Ambulatory Visit (INDEPENDENT_AMBULATORY_CARE_PROVIDER_SITE_OTHER): Payer: Medicare HMO | Admitting: Adult Health

## 2018-02-07 VITALS — BP 108/80 | HR 63 | Temp 98.4°F | Wt 165.0 lb

## 2018-02-07 DIAGNOSIS — R5383 Other fatigue: Secondary | ICD-10-CM | POA: Diagnosis not present

## 2018-02-07 NOTE — Progress Notes (Signed)
   Subjective:    [name], female    DOB: 10-Oct-1941, 77 y.o.   MRN: [id number]  HPI Patient presents with continued weakness, fatigue and light-headedness. She is concerned that this could be related to her thyroid or her effexor which was started in February.  She denies fever, chills.  Her constipation is resolved since last visit.  No diarrhea, nausea or vomiting.    Review of Systems  Constitutional: Positive for fatigue. Negative for appetite change, chills, fever and unexpected weight change.  Respiratory: Negative for cough, chest tightness, shortness of breath and wheezing.   Cardiovascular: Negative for chest pain.  Gastrointestinal: Negative for abdominal pain, constipation, diarrhea, nausea and vomiting.  Neurological: Positive for weakness and light-headedness.      Objective:   Physical Exam  Constitutional: She is oriented to person, place, and time. She appears well-developed and well-nourished. No distress.  Cardiovascular: Normal rate, regular rhythm, normal heart sounds and intact distal pulses. Exam reveals no gallop and no friction rub.  No murmur heard. Pulmonary/Chest: Effort normal and breath sounds normal. No respiratory distress. She has no wheezes. She has no rales.  Musculoskeletal: Normal range of motion. She exhibits no edema.  Neurological: She is alert and oriented to person, place, and time. She exhibits normal muscle tone. Coordination normal.  Skin: Skin is warm and dry. No rash noted. She is not diaphoretic. No erythema. No pallor.  Psychiatric: She has a normal mood and affect. Her behavior is normal. Judgment and thought content normal.  Nursing note and vitals reviewed.     Assessment & Plan:  1. Fatigue, unspecified type Keep as active as possible.  It may take more time to recover from your respiratory infection followed by the bout of extreme constipation.    Follow up as needed.  Melquiades Kovar C Darleth Eustache BSN RN NP student

## 2018-02-07 NOTE — Progress Notes (Signed)
Subjective:    [name], female    DOB: 06/20/1941, 77 y.o.   MRN: [id number]  HPI  77 year old female who  has a past medical history of Anxiety, Arthritis, Breast cancer ([place]) (08/08/13), Cataract, Chronic insomnia, Cluster headaches, Depression, Fibromyalgia, GERD (gastroesophageal reflux disease), H/O hiatal hernia, History of colonic polyps, History of transfusion (08/30/2013), radiation therapy (10/29/13- 12/14/13), Hypothyroidism, Irritable bowel syndrome, Kidney stone, Lymphedema, Macular degeneration, Osteopenia, Other abnormal glucose, Other and unspecified hyperlipidemia, Pain in joint, shoulder region, Pneumonia ([id number]), Sleep apnea, and Stress incontinence, female.  She presents to the office today for continued weakness and lightheadedness. She was treated two weeks ago for URI with doxycycline 100 mg. Today in the office she reports that she is feeling " much better" but continues to feel fatigued and lightheaded. She reports that she has not been doing anything over the last two weeks but has been sleeping throughout the day and has not been doing much when it comes to her ADL's.   She denies any dizziness, syncope, or pre syncope.   She is staying well hydrated  Review of Systems See HPI   Past Medical History:  Diagnosis Date  . Anxiety    PHOBIAS  . Arthritis   . Breast cancer ([place]) 08/08/13   right LOQ  . Cataract   . Chronic insomnia   . Cluster headaches    history of migraines / NONE FOR SEVERAL YRS  . Depression   . Fibromyalgia   . GERD (gastroesophageal reflux disease)   . H/O hiatal hernia   . History of colonic polyps   . History of transfusion 08/30/2013  . Hx of radiation therapy 10/29/13- 12/14/13   right chest wall 5040 cGy 28 sessions, right supraclavicular/axillary region 5040 cGy 28 sessions, right chest wall boost 1000 cGy 5 sessions  . Hypothyroidism   . Irritable bowel syndrome   . Kidney stone   . Lymphedema    RT ARM  - WEARS SLEEVE  . Macular degeneration    hole/right eye  . Osteopenia   . Other abnormal glucose   . Other and unspecified hyperlipidemia   . Pain in joint, shoulder region   . Pneumonia 3354,5625  . Sleep apnea    USES C-PAP  . Stress incontinence, female     Social History   Socioeconomic History  . Marital status: Married    Spouse name: Not on file  . Number of children: 3  . Years of education: Not on file  . Highest education level: Not on file  Occupational History  . Occupation: retired Medical laboratory scientific officer  . Financial resource strain: Not on file  . Food insecurity:    Worry: Not on file    Inability: Not on file  . Transportation needs:    Medical: Not on file    Non-medical: Not on file  Tobacco Use  . Smoking status: Former Smoker    Packs/day: 0.10    Years: 2.00    Pack years: 0.20    Types: Cigarettes    Start date: 11/16/1959    Last attempt to quit: 11/15/1960    Years since quitting: 57.2  . Smokeless tobacco: Never Used  Substance and Sexual Activity  . Alcohol use: No    Alcohol/week: 0.0 oz  . Drug use: No  . Sexual activity: Not on file    Comment: menarche age 88, fist live birth 19, P 3, hysterectomy age 47, no HRT, BCP  2 yrs  Lifestyle  . Physical activity:    Days per week: Not on file    Minutes per session: Not on file  . Stress: Not on file  Relationships  . Social connections:    Talks on phone: Not on file    Gets together: Not on file    Attends religious service: Not on file    Active member of club or organization: Not on file    Attends meetings of clubs or organizations: Not on file    Relationship status: Not on file  . Intimate partner violence:    Fear of current or ex partner: Not on file    Emotionally abused: Not on file    Physically abused: Not on file    Forced sexual activity: Not on file  Other Topics Concern  . Not on file  Social History Narrative   Occupation:  Retired Radiation protection practitioner    Married with 3  grown children      Never Smoked     Alcohol use-no         Past Surgical History:  Procedure Laterality Date  . ABDOMINAL HYSTERECTOMY    . APPENDECTOMY    . BILATERAL TOTAL MASTECTOMY WITH AXILLARY LYMPH NODE DISSECTION  08/30/2013   Dr Barry Dienes  . BREAST CYST ASPIRATION     9 cysts  . CATARACT EXTRACTION, BILATERAL  2005/2007  . CHOLECYSTECTOMY    . COLONOSCOPY    . EVACUATION BREAST HEMATOMA Left 08/31/2013   Procedure: EVACUATION HEMATOMA BREAST;  Surgeon: Stark Klein, MD;  Location: [facility];  Service: General;  Laterality: Left;  . EYE SURGERY     to repair macular hole  . FOOT ARTHROPLASTY     lt   . GANGLION CYST EXCISION     rt foot  . HEMORRHOID SURGERY     03/1993  . JOINT REPLACEMENT  03/15/11   left knee replacement  . KNEE ARTHROSCOPY     /partial knee 2016/left knee 2012  . MASS EXCISION  11/04/2011   Procedure: EXCISION MASS;  Surgeon: Cammie Sickle., MD;  Location: [facility];  Service: Orthopedics;  Laterality: Right;  excisional biopsy right ulna mass  . MASTECTOMY W/ SENTINEL NODE BIOPSY Right 08/30/2013   Procedure: RIGHT  AXILLARY SENTINEL LYMPH NODE BIOPSY; Right Axillary Node Disection;  Surgeon: Stark Klein, MD;  Location: [facility];  Service: General;  Laterality: Right;  Right side nuc med 7:00   . PARTIAL KNEE ARTHROPLASTY Right 11/03/2015   Procedure: RIGHT KNEE MEDIAL UNICOMPARTMENTAL ARTHROPLASTY ;  Surgeon: Gaynelle Arabian, MD;  Location: [facility];  Service: Orthopedics;  Laterality: Right;  . SIMPLE MASTECTOMY WITH AXILLARY SENTINEL NODE BIOPSY Left 08/30/2013   Procedure: Bilateral Breast Mastectomy ;  Surgeon: Stark Klein, MD;  Location: [facility];  Service: General;  Laterality: Left;  . skin tags removed     breast, panty line, neckline  . TOE SURGERY     preventative crossover toe surg/right foot  . TOE SURGERY  2009   left foot/screw  in 2nd toe  . TONSILLECTOMY    . UPPER GASTROINTESTINAL ENDOSCOPY      Family History    Problem Relation Age of Onset  . Stroke Mother        died age 38  . Diabetes Mother   . Breast cancer Mother 54  . Breast cancer Sister 44  . Breast cancer Paternal Aunt 21  . Diabetes Maternal Grandfather   . Breast  cancer Paternal Grandmother 45  . Breast cancer Paternal Aunt        dx in her 29s  . Cancer Maternal Grandmother        intra-abdominal cancer  . Brain cancer Maternal Uncle 8  . Brain cancer Cousin 75       maternal cousin  . Brain cancer Cousin 20       paternal cousin  . Colon cancer Neg Hx     No Known Allergies  Current Outpatient Medications on File Prior to Visit  Medication Sig Dispense Refill  . Calcium Carb-Cholecalciferol (CALCIUM-VITAMIN D) 500-400 MG-UNIT TABS Take by mouth 2 (two) times daily.     . cholecalciferol (VITAMIN D) 1000 units tablet Take 2,000 Units by mouth daily.    Marland Kitchen co-enzyme Q-10 30 MG capsule Take 300 mg by mouth daily.     . diclofenac sodium (VOLTAREN) 1 % GEL as needed.     Marland Kitchen esomeprazole (NEXIUM) 40 MG capsule Take 1 capsule by mouth  daily 90 capsule 1  . levothyroxine (SYNTHROID, LEVOTHROID) 100 MCG tablet Take 1 tablet (100 mcg total) daily before breakfast by mouth. Take one pill in middle of night 90 tablet 3  . LUTEIN PO Take by mouth.    . Melatonin 3 MG CAPS Take by mouth at bedtime.    . meloxicam (MOBIC) 7.5 MG tablet Take 1 tablet (7.5 mg total) daily by mouth. 90 tablet 1  . methocarbamol (ROBAXIN) 500 MG tablet Take 500 mg by mouth every 6 (six) hours as needed for muscle spasms.    Marland Kitchen omega-3 acid ethyl esters (LOVAZA) 1 g capsule Take 1 capsule (1 g total) 2 (two) times daily by mouth. 180 capsule 3  . oxyCODONE-acetaminophen (PERCOCET/ROXICET) 5-325 MG tablet Take 1 tablet by mouth every 8 (eight) hours as needed for severe pain. 8 tablet 0  . polyethylene glycol (MIRALAX / GLYCOLAX) packet Take 17 g by mouth daily.    . pregabalin (LYRICA) 75 MG capsule Take 1 capsule (75 mg total) 2 (two) times daily by mouth.  180 capsule 0  . saccharomyces boulardii (FLORASTOR) 250 MG capsule Take 250 mg by mouth daily.    . simvastatin (ZOCOR) 10 MG tablet TAKE 1 TABLET BY MOUTH AT BEDTIME 90 tablet 1  . tamoxifen (NOLVADEX) 20 MG tablet Take 1 tablet (20 mg total) daily by mouth. 90 tablet 4  . traMADol (ULTRAM) 50 MG tablet Take 1-2 tablets (50-100 mg total) by mouth every 6 (six) hours as needed (mild pain). 80 tablet 1  . TURMERIC PO Take 1,000 mg by mouth daily.    Marland Kitchen venlafaxine XR (EFFEXOR-XR) 150 MG 24 hr capsule Take 1 capsule (150 mg total) by mouth daily. 30 capsule 0   No current facility-administered medications on file prior to visit.     BP 108/80   Pulse 63   Temp 98.4 F (36.9 C) (Oral)   Wt 165 lb (74.8 kg)   SpO2 97%   BMI 33.33 kg/m       Objective:   Physical Exam  Constitutional: She is oriented to person, place, and time. She appears well-developed and well-nourished. No distress.  Cardiovascular: Normal rate, regular rhythm, normal heart sounds and intact distal pulses. Exam reveals no friction rub.  No murmur heard. Pulmonary/Chest: Effort normal and breath sounds normal. No respiratory distress. She has no wheezes. She has no rales. She exhibits no tenderness.  Neurological: She is alert and oriented to person, place, and time.  Skin: Skin is warm and dry. No rash noted. She is not diaphoretic. No erythema. No pallor.  Psychiatric: She has a normal mood and affect. Her behavior is normal. Judgment and thought content normal.  Nursing note and vitals reviewed.     Assessment & Plan:  1. Fatigue, unspecified type - Likely etiology is deconditioning. I am not concerned about TSH or Vitamin D at this time. Her levels were normal less than a year ago and she takes her medication on a daily basis. I am not discounting side effect of Effexor as her symptoms started soon after starting this medication. Advised to talk to her psychiatrist about this. In the mean time, advance activity  as directed   [name], NP

## 2018-02-08 ENCOUNTER — Encounter: Payer: Self-pay | Admitting: Adult Health

## 2018-02-08 ENCOUNTER — Other Ambulatory Visit: Payer: Self-pay | Admitting: Adult Health

## 2018-02-08 MED ORDER — PREGABALIN 75 MG PO CAPS: 75.0000 mg | ORAL_CAPSULE | Freq: Two times a day (BID) | ORAL | 0 refills | Status: DC

## 2018-02-12 NOTE — Progress Notes (Signed)
[facility] MD/PA/NP OP Progress Note  02/13/2018 11:25 AM Jean Nash  MRN:  [id number]  Chief Complaint:  Chief Complaint    Depression; Follow-up; Anxiety     HPI: Patient presents for follow-up appointment for depression.  She states that she has been feeling much better after starting Effexor. She talks about an episode of her husband could not find his wallet; although she could have panicked about this, she was able to cope well. She shared a picture, which was taken 27 years ago; she wares the same knit today. She had her hair short cut after few years. She has bene able to go to balance class without concerning about her husband. She did not have crying spells as she used to on March [birth year]th, her father's birthday. She occasionally has insomnia. She has more energy and motivation.  She denies feeling fatigue except the time she had pneumonia.  she denies anxiety or panic attacks.  She denies SI.   Visit Diagnosis:    ICD-10-CM   1. MDD (major depressive disorder), recurrent episode, mild ([place]) F33.0     Past Psychiatric History:  I have reviewed the patient's psychiatry history in detail and updated the patient record. Outpatient: denies Psychiatry admission: denies  Previous suicide attempt: denies Past trials of medication: citalopram History of violence:    Past Medical History:  Past Medical History:  Diagnosis Date  . Anxiety    PHOBIAS  . Arthritis   . Breast cancer ([place]) 08/08/13   right LOQ  . Cataract   . Chronic insomnia   . Cluster headaches    history of migraines / NONE FOR SEVERAL YRS  . Depression   . Fibromyalgia   . GERD (gastroesophageal reflux disease)   . H/O hiatal hernia   . History of colonic polyps   . History of transfusion 08/30/2013  . Hx of radiation therapy 10/29/13- 12/14/13   right chest wall 5040 cGy 28 sessions, right supraclavicular/axillary region 5040 cGy 28 sessions, right chest wall boost 1000 cGy 5 sessions  . Hypothyroidism   .  Irritable bowel syndrome   . Kidney stone   . Lymphedema    RT ARM - WEARS SLEEVE  . Macular degeneration    hole/right eye  . Osteopenia   . Other abnormal glucose   . Other and unspecified hyperlipidemia   . Pain in joint, shoulder region   . Pneumonia 4166,0630  . Sleep apnea    USES C-PAP  . Stress incontinence, female     Past Surgical History:  Procedure Laterality Date  . ABDOMINAL HYSTERECTOMY    . APPENDECTOMY    . BILATERAL TOTAL MASTECTOMY WITH AXILLARY LYMPH NODE DISSECTION  08/30/2013   Dr Barry Dienes  . BREAST CYST ASPIRATION     9 cysts  . CATARACT EXTRACTION, BILATERAL  2005/2007  . CHOLECYSTECTOMY    . COLONOSCOPY    . EVACUATION BREAST HEMATOMA Left 08/31/2013   Procedure: EVACUATION HEMATOMA BREAST;  Surgeon: Stark Klein, MD;  Location: [facility];  Service: General;  Laterality: Left;  . EYE SURGERY     to repair macular hole  . FOOT ARTHROPLASTY     lt   . GANGLION CYST EXCISION     rt foot  . HEMORRHOID SURGERY     03/1993  . JOINT REPLACEMENT  03/15/11   left knee replacement  . KNEE ARTHROSCOPY     /partial knee 2016/left knee 2012  . MASS EXCISION  11/04/2011   Procedure: EXCISION MASS;  Surgeon: Cammie Sickle., MD;  Location: [facility];  Service: Orthopedics;  Laterality: Right;  excisional biopsy right ulna mass  . MASTECTOMY W/ SENTINEL NODE BIOPSY Right 08/30/2013   Procedure: RIGHT  AXILLARY SENTINEL LYMPH NODE BIOPSY; Right Axillary Node Disection;  Surgeon: Stark Klein, MD;  Location: [facility];  Service: General;  Laterality: Right;  Right side nuc med 7:00   . PARTIAL KNEE ARTHROPLASTY Right 11/03/2015   Procedure: RIGHT KNEE MEDIAL UNICOMPARTMENTAL ARTHROPLASTY ;  Surgeon: Gaynelle Arabian, MD;  [facility];  Service: Orthopedics;  Laterality: Right;  . SIMPLE MASTECTOMY WITH AXILLARY SENTINEL NODE BIOPSY Left 08/30/2013   Procedure: Bilateral Breast Mastectomy ;  Surgeon: Stark Klein, MD;  Location: [facility];  Service:  General;  Laterality: Left;  . skin tags removed     breast, panty line, neckline  . TOE SURGERY     preventative crossover toe surg/right foot  . TOE SURGERY  2009   left foot/screw  in 2nd toe  . TONSILLECTOMY    . UPPER GASTROINTESTINAL ENDOSCOPY      Family Psychiatric History:  I have reviewed the patient's family history in detail and updated the patient record. Family History:  Family History  Problem Relation Age of Onset  . Stroke Mother        died age 78  . Diabetes Mother   . Breast cancer Mother 92  . Breast cancer Sister 51  . Breast cancer Paternal Aunt 99  . Diabetes Maternal Grandfather   . Breast cancer Paternal Grandmother 29  . Breast cancer Paternal Aunt        dx in her 32s  . Cancer Maternal Grandmother        intra-abdominal cancer  . Brain cancer Maternal Uncle 8  . Brain cancer Cousin 85       maternal cousin  . Brain cancer Cousin 20       paternal cousin  . Colon cancer Neg Hx     Social History:  Social History   Socioeconomic History  . Marital status: Married    Spouse name: Not on file  . Number of children: 3  . Years of education: Not on file  . Highest education level: Not on file  Occupational History  . Occupation: retired Medical laboratory scientific officer  . Financial resource strain: Not on file  . Food insecurity:    Worry: Not on file    Inability: Not on file  . Transportation needs:    Medical: Not on file    Non-medical: Not on file  Tobacco Use  . Smoking status: Former Smoker    Packs/day: 0.10    Years: 2.00    Pack years: 0.20    Types: Cigarettes    Start date: 11/16/1959    Last attempt to quit: 11/15/1960    Years since quitting: 57.2  . Smokeless tobacco: Never Used  Substance and Sexual Activity  . Alcohol use: No    Alcohol/week: 0.0 oz  . Drug use: No  . Sexual activity: Not on file    Comment: menarche age 13, fist live birth 31, P 3, hysterectomy age 29, no HRT, BCP 2 yrs  Lifestyle  . Physical  activity:    Days per week: Not on file    Minutes per session: Not on file  . Stress: Not on file  Relationships  . Social connections:    Talks on phone: Not on file    Gets  together: Not on file    Attends religious service: Not on file    Active member of club or organization: Not on file    Attends meetings of clubs or organizations: Not on file    Relationship status: Not on file  Other Topics Concern  . Not on file  Social History Narrative   Occupation:  Retired Radiation protection practitioner    Married with 3 grown children      Never Smoked     Alcohol use-no         Allergies: No Known Allergies  Metabolic Disorder Labs: Lab Results  Component Value Date   HGBA1C 6.0 07/14/2017   MPG 126 (H) 08/27/2013   MPG 117 (H) 06/22/2013   Lab Results  Component Value Date   PROLACTIN 3.9 07/22/2009   Lab Results  Component Value Date   CHOL 174 07/14/2017   TRIG 141.0 07/14/2017   HDL 41.00 07/14/2017   CHOLHDL 4 07/14/2017   VLDL 28.2 07/14/2017   LDLCALC 105 (H) 07/14/2017   LDLCALC 131 (H) 04/30/2015   Lab Results  Component Value Date   TSH 0.75 07/14/2017   TSH 3.29 08/16/2016    Therapeutic Level Labs: No results found for: LITHIUM No results found for: VALPROATE No components found for:  CBMZ  Current Medications: Current Outpatient Medications  Medication Sig Dispense Refill  . Calcium Carb-Cholecalciferol (CALCIUM-VITAMIN D) 500-400 MG-UNIT TABS Take by mouth 2 (two) times daily.     . cholecalciferol (VITAMIN D) 1000 units tablet Take 2,000 Units by mouth daily.    Marland Kitchen co-enzyme Q-10 30 MG capsule Take 300 mg by mouth daily.     . diclofenac sodium (VOLTAREN) 1 % GEL as needed.     Marland Kitchen esomeprazole (NEXIUM) 40 MG capsule Take 1 capsule by mouth  daily 90 capsule 1  . levothyroxine (SYNTHROID, LEVOTHROID) 100 MCG tablet Take 1 tablet (100 mcg total) daily before breakfast by mouth. Take one pill in middle of night 90 tablet 3  . LUTEIN PO Take by mouth.    .  Melatonin 3 MG CAPS Take by mouth at bedtime.    . meloxicam (MOBIC) 7.5 MG tablet Take 1 tablet (7.5 mg total) daily by mouth. 90 tablet 1  . methocarbamol (ROBAXIN) 500 MG tablet Take 500 mg by mouth every 6 (six) hours as needed for muscle spasms.    Marland Kitchen omega-3 acid ethyl esters (LOVAZA) 1 g capsule Take 1 capsule (1 g total) 2 (two) times daily by mouth. 180 capsule 3  . oxyCODONE-acetaminophen (PERCOCET/ROXICET) 5-325 MG tablet Take 1 tablet by mouth every 8 (eight) hours as needed for severe pain. 8 tablet 0  . polyethylene glycol (MIRALAX / GLYCOLAX) packet Take 17 g by mouth daily.    . pregabalin (LYRICA) 75 MG capsule Take 1 capsule (75 mg total) by mouth 2 (two) times daily. 180 capsule 0  . saccharomyces boulardii (FLORASTOR) 250 MG capsule Take 250 mg by mouth daily.    . simvastatin (ZOCOR) 10 MG tablet TAKE 1 TABLET BY MOUTH AT BEDTIME 90 tablet 1  . tamoxifen (NOLVADEX) 20 MG tablet Take 1 tablet (20 mg total) daily by mouth. 90 tablet 4  . traMADol (ULTRAM) 50 MG tablet Take 1-2 tablets (50-100 mg total) by mouth every 6 (six) hours as needed (mild pain). 80 tablet 1  . TURMERIC PO Take 1,000 mg by mouth daily.    Marland Kitchen venlafaxine XR (EFFEXOR-XR) 150 MG 24 hr capsule Take 1 capsule (150 mg  total) by mouth daily. 90 capsule 0   No current facility-administered medications for this visit.      Musculoskeletal: Strength & Muscle Tone: within normal limits Gait & Station: normal Patient leans: N/A  Psychiatric Specialty Exam: Review of Systems  Psychiatric/Behavioral: Negative for depression, hallucinations, memory loss, substance abuse and suicidal ideas. The patient has insomnia. The patient is not nervous/anxious.   All other systems reviewed and are negative.   Blood pressure 137/85, pulse 67, height 4\' 11"  (1.499 m), weight 166 lb (75.3 kg), SpO2 97 %.Body mass index is 33.53 kg/m.  General Appearance: Fairly Groomed  Eye Contact:  Good  Speech:  Clear and Coherent   Volume:  Normal  Mood:  "better"  Affect:  Appropriate, Congruent and reactive  Thought Process:  Coherent and Goal Directed  Orientation:  Full (Time, Place, and Person)  Thought Content: Logical   Suicidal Thoughts:  No  Homicidal Thoughts:  No  Memory:  Immediate;   Good Recent;   Good Remote;   Good  Judgement:  Good  Insight:  Good  Psychomotor Activity:  Normal  Concentration:  Concentration: Good and Attention Span: Good  Recall:  Good  Fund of Knowledge: Good  Language: Good  Akathisia:  No  Handed:  Right  AIMS (if indicated): not done  Assets:  Communication Skills Desire for Improvement  ADL's:  Intact  Cognition: WNL  Sleep:  Poor   Screenings: PHQ2-9     Office Visit from 07/14/2017 in [place] at [facility] from 11/28/2015 in [place] at [facility] from 10/14/2014 in [place] at [facility] from 10/01/2013 in [place] at [facility] Total Score  0  0  1  0  PHQ-9 Total Score  5  -  -  -       Assessment and Plan:  Jean Nash is a 77 y.o. year old female with a history of depression, anxiety, breat cancer s/p mastectomy , who presents for follow up appointment for MDD (major depressive disorder), recurrent episode, mild ([place])  # MDD, mild, recurrent without psychotic features There has been significant improvement in neurovegetative symptoms since switching from citalopram to Effexor.  Will continue current dose to target neurovegetative symptoms.  Noted that this medication was chosen given it has less interaction with tamoxifen.  Discussed risk of hypertension.  Provided psychoeducation that this medication to be continued at least 9 months to 1 year (although patient has a history of depression, she has not tried psychotropics in the past) to avoid relapse. Discussed behavioral activation. Discussed about upcoming therapy appointment- she is not interested in it anymore  given she feels much better.   Plan 1. Continue Effexor 150 mg daily  2. Return to clinic in three months for 15 mins  The patient demonstrates the following risk factors for suicide: Chronic risk factors for suicide include: psychiatric disorder of depression. Acute risk factors for suicide include: N/A. Protective factors for this patient include: positive social support, coping skills and hope for the future. Considering these factors, the overall suicide risk at this point appears to be low. Patient is appropriate for outpatient follow up.  The duration of this appointment visit was 30 minutes of face-to-face time with the patient.  Greater than 50% of this time was spent in counseling, explanation of  diagnosis, planning of further management, and coordination of care.,in  Norman Clay, MD 02/13/2018, 11:25 AM

## 2018-02-13 ENCOUNTER — Ambulatory Visit (INDEPENDENT_AMBULATORY_CARE_PROVIDER_SITE_OTHER): Payer: Medicare HMO | Admitting: Psychiatry

## 2018-02-13 ENCOUNTER — Ambulatory Visit: Payer: Medicare HMO | Admitting: Psychology

## 2018-02-13 ENCOUNTER — Encounter (HOSPITAL_COMMUNITY): Payer: Self-pay | Admitting: Psychiatry

## 2018-02-13 VITALS — BP 137/85 | HR 67 | Ht 59.0 in | Wt 166.0 lb

## 2018-02-13 DIAGNOSIS — Z87891 Personal history of nicotine dependence: Secondary | ICD-10-CM

## 2018-02-13 DIAGNOSIS — G4700 Insomnia, unspecified: Secondary | ICD-10-CM

## 2018-02-13 DIAGNOSIS — Z853 Personal history of malignant neoplasm of breast: Secondary | ICD-10-CM

## 2018-02-13 DIAGNOSIS — F330 Major depressive disorder, recurrent, mild: Secondary | ICD-10-CM | POA: Diagnosis not present

## 2018-02-13 DIAGNOSIS — R69 Illness, unspecified: Secondary | ICD-10-CM | POA: Diagnosis not present

## 2018-02-13 MED ORDER — VENLAFAXINE HCL ER 150 MG PO CP24: 150.0000 mg | ORAL_CAPSULE | Freq: Every day | ORAL | 0 refills | Status: DC

## 2018-02-13 NOTE — Patient Instructions (Signed)
1. Continue Effexor 150 mg daily  2. Return to clinic in three months for 15 mins 

## 2018-02-14 ENCOUNTER — Ambulatory Visit (INDEPENDENT_AMBULATORY_CARE_PROVIDER_SITE_OTHER): Payer: Medicare HMO | Admitting: Adult Health

## 2018-02-14 ENCOUNTER — Encounter: Payer: Self-pay | Admitting: Adult Health

## 2018-02-14 VITALS — BP 110/80 | Temp 98.3°F | Wt 167.0 lb

## 2018-02-14 DIAGNOSIS — R42 Dizziness and giddiness: Secondary | ICD-10-CM | POA: Diagnosis not present

## 2018-02-14 DIAGNOSIS — F19982 Other psychoactive substance use, unspecified with psychoactive substance-induced sleep disorder: Secondary | ICD-10-CM

## 2018-02-14 DIAGNOSIS — R69 Illness, unspecified: Secondary | ICD-10-CM | POA: Diagnosis not present

## 2018-02-14 DIAGNOSIS — K5903 Drug induced constipation: Secondary | ICD-10-CM

## 2018-02-14 NOTE — Progress Notes (Signed)
Subjective:    [name], female    DOB: 06/21/1941, 77 y.o.   MRN: [id number]  HPI   77 year old female who  has a past medical history of Anxiety, Arthritis, Breast cancer ([place]) (08/08/13), Cataract, Chronic insomnia, Cluster headaches, Depression, Fibromyalgia, GERD (gastroesophageal reflux disease), H/O hiatal hernia, History of colonic polyps, History of transfusion (08/30/2013), radiation therapy (10/29/13- 12/14/13), Hypothyroidism, Irritable bowel syndrome, Kidney stone, Lymphedema, Macular degeneration, Osteopenia, Other abnormal glucose, Other and unspecified hyperlipidemia, Pain in joint, shoulder region, Pneumonia (3716,9678), Sleep apnea, and Stress incontinence, female.  She presents to the office today for continued lightheadedness, constipation, insomnia.  All of her symptoms started approximately 1-1-1/2 months ago, this was about the time that she started taking Effexor.  Reports most recently she stopped taking MiraLAX due to causing diarrhea, she was able to have a bowel movement this morning but prior to that she went 2 days without having one.  Does eat a high-fiber diet at home  He continues to feel episodes of lightheadedness, denies dizziness or the world spinning, "I just feel weak at times when I am walking around."  Denies having any falls or blurred vision   She does feel as though the Effexor is beneficial for her, she does not feels depressed or anxious.  Was recently seen by a psychiatrist  Review of Systems See HPI   Past Medical History:  Diagnosis Date  . Anxiety    PHOBIAS  . Arthritis   . Breast cancer ([place]) 08/08/13   right LOQ  . Cataract   . Chronic insomnia   . Cluster headaches    history of migraines / NONE FOR SEVERAL YRS  . Depression   . Fibromyalgia   . GERD (gastroesophageal reflux disease)   . H/O hiatal hernia   . History of colonic polyps   . History of transfusion 08/30/2013  . Hx of radiation therapy 10/29/13-  12/14/13   right chest wall 5040 cGy 28 sessions, right supraclavicular/axillary region 5040 cGy 28 sessions, right chest wall boost 1000 cGy 5 sessions  . Hypothyroidism   . Irritable bowel syndrome   . Kidney stone   . Lymphedema    RT ARM - WEARS SLEEVE  . Macular degeneration    hole/right eye  . Osteopenia   . Other abnormal glucose   . Other and unspecified hyperlipidemia   . Pain in joint, shoulder region   . Pneumonia 9381,[id number]  . Sleep apnea    USES C-PAP  . Stress incontinence, female     Social History   Socioeconomic History  . Marital status: Married    Spouse name: Not on file  . Number of children: 3  . Years of education: Not on file  . Highest education level: Not on file  Occupational History  . Occupation: retired Medical laboratory scientific officer  . Financial resource strain: Not on file  . Food insecurity:    Worry: Not on file    Inability: Not on file  . Transportation needs:    Medical: Not on file    Non-medical: Not on file  Tobacco Use  . Smoking status: Former Smoker    Packs/day: 0.10    Years: 2.00    Pack years: 0.20    Types: Cigarettes    Start date: 11/16/1959    Last attempt to quit: 11/15/1960    Years since quitting: 57.2  . Smokeless tobacco: Never Used  Substance and Sexual Activity  .  Alcohol use: No    Alcohol/week: 0.0 oz  . Drug use: No  . Sexual activity: Not on file    Comment: menarche age 51, fist live birth 5, P 3, hysterectomy age 63, no HRT, BCP 2 yrs  Lifestyle  . Physical activity:    Days per week: Not on file    Minutes per session: Not on file  . Stress: Not on file  Relationships  . Social connections:    Talks on phone: Not on file    Gets together: Not on file    Attends religious service: Not on file    Active member of club or organization: Not on file    Attends meetings of clubs or organizations: Not on file    Relationship status: Not on file  . Intimate partner violence:    Fear of current or ex  partner: Not on file    Emotionally abused: Not on file    Physically abused: Not on file    Forced sexual activity: Not on file  Other Topics Concern  . 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 ABDOMINAL HYSTERECTOMY    . APPENDECTOMY    . BILATERAL TOTAL MASTECTOMY WITH AXILLARY LYMPH NODE DISSECTION  08/30/2013   Dr Barry Dienes  . BREAST CYST ASPIRATION     9 cysts  . CATARACT EXTRACTION, BILATERAL  2005/2007  . CHOLECYSTECTOMY    . COLONOSCOPY    . EVACUATION BREAST HEMATOMA Left 08/31/2013   Procedure: EVACUATION HEMATOMA BREAST;  Surgeon: Stark Klein, MD;  Location: [facility];  Service: General;  Laterality: Left;  . EYE SURGERY     to repair macular hole  . FOOT ARTHROPLASTY     lt   . GANGLION CYST EXCISION     rt foot  . HEMORRHOID SURGERY     03/1993  . JOINT REPLACEMENT  03/15/11   left knee replacement  . KNEE ARTHROSCOPY     /partial knee 2016/left knee 2012  . MASS EXCISION  11/04/2011   Procedure: EXCISION MASS;  Surgeon: Cammie Sickle., MD;  Location: [facility];  Service: Orthopedics;  Laterality: Right;  excisional biopsy right ulna mass  . MASTECTOMY W/ SENTINEL NODE BIOPSY Right 08/30/2013   Procedure: RIGHT  AXILLARY SENTINEL LYMPH NODE BIOPSY; Right Axillary Node Disection;  Surgeon: Stark Klein, MD;  Location: [place] [facility];  Service: General;  Laterality: Right;  Right side nuc med 7:00   . PARTIAL KNEE ARTHROPLASTY Right 11/03/2015   Procedure: RIGHT KNEE MEDIAL UNICOMPARTMENTAL ARTHROPLASTY ;  Surgeon: Gaynelle Arabian, MD;  Location: [facility];  Service: Orthopedics;  Laterality: Right;  . SIMPLE MASTECTOMY WITH AXILLARY SENTINEL NODE BIOPSY Left 08/30/2013   Procedure: Bilateral Breast Mastectomy ;  Surgeon: Stark Klein, MD;  Location: [place];  Service: General;  Laterality: Left;  .  skin tags removed     breast, panty line, neckline  . TOE SURGERY     preventative crossover toe surg/right foot  . TOE SURGERY  2009   left foot/screw  in 2nd toe  . TONSILLECTOMY    . UPPER GASTROINTESTINAL ENDOSCOPY      Family History  Problem Relation Age of Onset  . Stroke Mother  died age 61  . Diabetes Mother   . Breast cancer Mother 25  . Breast cancer Sister 31  . Breast cancer Paternal Aunt 80  . Diabetes Maternal Grandfather   . Breast cancer Paternal Grandmother 96  . Breast cancer Paternal Aunt        dx in her 60s  . Cancer Maternal Grandmother        intra-abdominal cancer  . Brain cancer Maternal Uncle 8  . Brain cancer Cousin 25       maternal cousin  . Brain cancer Cousin 20       paternal cousin  . Colon cancer Neg Hx     No Known Allergies  Current Outpatient Medications on File Prior to Visit  Medication Sig Dispense Refill  . Calcium Carb-Cholecalciferol (CALCIUM-VITAMIN D) 500-400 MG-UNIT TABS Take by mouth 2 (two) times daily.     . cholecalciferol (VITAMIN D) 1000 units tablet Take 2,000 Units by mouth daily.    Marland Kitchen co-enzyme Q-10 30 MG capsule Take 300 mg by mouth daily.     . diclofenac sodium (VOLTAREN) 1 % GEL as needed.     Marland Kitchen esomeprazole (NEXIUM) 40 MG capsule Take 1 capsule by mouth  daily 90 capsule 1  . levothyroxine (SYNTHROID, LEVOTHROID) 100 MCG tablet Take 1 tablet (100 mcg total) daily before breakfast by mouth. Take one pill in middle of night 90 tablet 3  . LUTEIN PO Take by mouth.    . Melatonin 3 MG CAPS Take by mouth at bedtime.    . meloxicam (MOBIC) 7.5 MG tablet Take 1 tablet (7.5 mg total) daily by mouth. 90 tablet 1  . methocarbamol (ROBAXIN) 500 MG tablet Take 500 mg by mouth every 6 (six) hours as needed for muscle spasms.    Marland Kitchen omega-3 acid ethyl esters (LOVAZA) 1 g capsule Take 1 capsule (1 g total) 2 (two) times daily by mouth. 180 capsule 3  . oxyCODONE-acetaminophen (PERCOCET/ROXICET) 5-325 MG tablet Take 1  tablet by mouth every 8 (eight) hours as needed for severe pain. 8 tablet 0  . pregabalin (LYRICA) 75 MG capsule Take 1 capsule (75 mg total) by mouth 2 (two) times daily. 180 capsule 0  . saccharomyces boulardii (FLORASTOR) 250 MG capsule Take 250 mg by mouth daily.    . simvastatin (ZOCOR) 10 MG tablet TAKE 1 TABLET BY MOUTH AT BEDTIME 90 tablet 1  . tamoxifen (NOLVADEX) 20 MG tablet Take 1 tablet (20 mg total) daily by mouth. 90 tablet 4  . traMADol (ULTRAM) 50 MG tablet Take 1-2 tablets (50-100 mg total) by mouth every 6 (six) hours as needed (mild pain). 80 tablet 1  . TURMERIC PO Take 1,000 mg by mouth daily.    Marland Kitchen venlafaxine XR (EFFEXOR-XR) 150 MG 24 hr capsule Take 1 capsule (150 mg total) by mouth daily. 90 capsule 0  . polyethylene glycol (MIRALAX / GLYCOLAX) packet Take 17 g by mouth daily.     No current facility-administered medications on file prior to visit.     BP 110/80 (BP Location: Left Arm)   Temp 98.3 F (36.8 C) (Oral)   Wt 167 lb (75.8 kg)   BMI 33.73 kg/m       Objective:   Physical Exam  Constitutional: She is oriented to person, place, and time. She appears well-developed and well-nourished. No distress.  Eyes: Pupils are equal, round, and reactive to light. Conjunctivae and EOM are normal. Right eye exhibits no discharge. Left eye exhibits no  discharge. Right eye exhibits no nystagmus. Left eye exhibits no nystagmus.  Cardiovascular: Normal rate, regular rhythm, normal heart sounds and intact distal pulses. Exam reveals no gallop and no friction rub.  No murmur heard. Pulmonary/Chest: Effort normal and breath sounds normal. No respiratory distress. She has no wheezes. She has no rales. She exhibits no tenderness.  Neurological: She is alert and oriented to person, place, and time.  Skin: Skin is warm and dry. No rash noted. She is not diaphoretic. No erythema. No pallor.  Psychiatric: She has a normal mood and affect. Her behavior is normal. Judgment and  thought content normal.  Nursing note and vitals reviewed.     Assessment & Plan:  All of her symptoms are side effects of Effexor, and started roughly the same time that she started this medication.  She does not want to go off this medication as she feels as though it is providing more benefit doing harm.  Advised to go start MiraLAX and can add a fiber supplement Metamucil if needed.  Continue to drink plenty of water and high-fiber diet -Follow up as needed  [name], NP

## 2018-02-15 ENCOUNTER — Other Ambulatory Visit: Payer: Self-pay | Admitting: Adult Health

## 2018-02-16 NOTE — Telephone Encounter (Signed)
Sent to the pharmacy by e-scribe. 

## 2018-02-17 ENCOUNTER — Ambulatory Visit (INDEPENDENT_AMBULATORY_CARE_PROVIDER_SITE_OTHER): Payer: Medicare HMO | Admitting: Psychology

## 2018-02-17 DIAGNOSIS — F411 Generalized anxiety disorder: Secondary | ICD-10-CM | POA: Diagnosis not present

## 2018-02-17 DIAGNOSIS — R69 Illness, unspecified: Secondary | ICD-10-CM | POA: Diagnosis not present

## 2018-02-24 ENCOUNTER — Ambulatory Visit (INDEPENDENT_AMBULATORY_CARE_PROVIDER_SITE_OTHER): Payer: Medicare HMO | Admitting: Adult Health

## 2018-02-24 ENCOUNTER — Encounter: Payer: Self-pay | Admitting: Adult Health

## 2018-02-24 VITALS — BP 124/70 | Temp 98.2°F | Wt 166.0 lb

## 2018-02-24 DIAGNOSIS — M25551 Pain in right hip: Secondary | ICD-10-CM

## 2018-02-24 DIAGNOSIS — R1032 Left lower quadrant pain: Secondary | ICD-10-CM

## 2018-02-24 MED ORDER — CYCLOBENZAPRINE HCL 10 MG PO TABS: 10.0000 mg | ORAL_TABLET | Freq: Three times a day (TID) | ORAL | 0 refills | Status: DC | PRN

## 2018-02-24 NOTE — Progress Notes (Signed)
Subjective:    [name], female    DOB: 1941-05-19, 77 y.o.   MRN: [id number]  HPI  77 year old female who  has a past medical history of Anxiety, Arthritis, Breast cancer ([place]) (08/08/13), Cataract, Chronic insomnia, Cluster headaches, Depression, Fibromyalgia, GERD (gastroesophageal reflux disease), H/O hiatal hernia, History of colonic polyps, History of transfusion (08/30/2013), radiation therapy (10/29/13- 12/14/13), Hypothyroidism, Irritable bowel syndrome, Kidney stone, Lymphedema, Macular degeneration, Osteopenia, Other abnormal glucose, Other and unspecified hyperlipidemia, Pain in joint, shoulder region, Pneumonia ([id number]), Sleep apnea, and Stress incontinence, female.  She presents to the office today for right hip pain and left lower quadrant pain.  Reports that she has had pain in her left lower quadrant for multiple years.  Has worked up in the past and no issues were found.  Pain is intermittent, happens 2 or 3 times a month and only happens when she is twisting getting out of a car.  Right hip pain started more recently, approximately 2 days ago.  Pain was worse last night but has started to improve.  Does not currently have any radiating pain.  Does report working out in the yard more over the week and has also started swimming again.  She denies any trauma to the area and has had no falls.  No bruising has been noted.  She has not been using anything over-the-counter for pain relief  Review of Systems  Constitutional: Negative.   Respiratory: Negative.   Cardiovascular: Negative.   Gastrointestinal: Positive for abdominal pain. Negative for abdominal distention, blood in stool, constipation, diarrhea, nausea, rectal pain and vomiting.  Musculoskeletal: Positive for arthralgias and myalgias.  Psychiatric/Behavioral: Negative.    See HPI   Past Medical History:  Diagnosis Date  . Anxiety    PHOBIAS  . Arthritis   . Breast cancer ([place]) 08/08/13   right  LOQ  . Cataract   . Chronic insomnia   . Cluster headaches    history of migraines / NONE FOR SEVERAL YRS  . Depression   . Fibromyalgia   . GERD (gastroesophageal reflux disease)   . H/O hiatal hernia   . History of colonic polyps   . History of transfusion 08/30/2013  . Hx of radiation therapy 10/29/13- 12/14/13   right chest wall 5040 cGy 28 sessions, right supraclavicular/axillary region 5040 cGy 28 sessions, right chest wall boost 1000 cGy 5 sessions  . Hypothyroidism   . Irritable bowel syndrome   . Kidney stone   . Lymphedema    RT ARM - WEARS SLEEVE  . Macular degeneration    hole/right eye  . Osteopenia   . Other abnormal glucose   . Other and unspecified hyperlipidemia   . Pain in joint, shoulder region   . Pneumonia 3491,7915  . Sleep apnea    USES C-PAP  . Stress incontinence, female     Social History   Socioeconomic History  . Marital status: Married    Spouse name: Not on file  . Number of children: 3  . Years of education: Not on file  . Highest education level: Not on file  Occupational History  . Occupation: retired Medical laboratory scientific officer  . Financial resource strain: Not on file  . Food insecurity:    Worry: Not on file    Inability: Not on file  . Transportation needs:    Medical: Not on file    Non-medical: Not on file  Tobacco Use  . Smoking status: Former Smoker  Packs/day: 0.10    Years: 2.00    Pack years: 0.20    Types: Cigarettes    Start date: 11/16/1959    Last attempt to quit: 11/15/1960    Years since quitting: 57.3  . Smokeless tobacco: Never Used  Substance and Sexual Activity  . Alcohol use: No    Alcohol/week: 0.0 oz  . Drug use: No  . Sexual activity: Not on file    Comment: menarche age 64, fist live birth 74, P 3, hysterectomy age 66, no HRT, BCP 2 yrs  Lifestyle  . Physical activity:    Days per week: Not on file    Minutes per session: Not on file  . Stress: Not on file  Relationships  . Social connections:      Talks on phone: Not on file    Gets together: Not on file    Attends religious service: Not on file    Active member of club or organization: Not on file    Attends meetings of clubs or organizations: Not on file    Relationship status: Not on file  . Intimate partner violence:    Fear of current or ex partner: Not on file    Emotionally abused: Not on file    Physically abused: Not on file    Forced sexual activity: Not on file  Other Topics Concern  . 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 ABDOMINAL HYSTERECTOMY    . APPENDECTOMY    . BILATERAL TOTAL MASTECTOMY WITH AXILLARY LYMPH NODE DISSECTION  08/30/2013   Dr Barry Dienes  . BREAST CYST ASPIRATION     9 cysts  . CATARACT EXTRACTION, BILATERAL  2005/2007  . CHOLECYSTECTOMY    . COLONOSCOPY    . EVACUATION BREAST HEMATOMA Left 08/31/2013   Procedure: EVACUATION HEMATOMA BREAST;  Surgeon: Stark Klein, MD;  Location: [facility];  Service: General;  Laterality: Left;  . EYE SURGERY     to repair macular hole  . FOOT ARTHROPLASTY     lt   . GANGLION CYST EXCISION     rt foot  . HEMORRHOID SURGERY     03/1993  . JOINT REPLACEMENT  03/15/11   left knee replacement  . KNEE ARTHROSCOPY     /partial knee 2016/left knee 2012  . MASS EXCISION  11/04/2011   Procedure: EXCISION MASS;  Surgeon: Cammie Sickle., MD;  Location: [facility];  Service: Orthopedics;  Laterality: Right;  excisional biopsy right ulna mass  . MASTECTOMY W/ SENTINEL NODE BIOPSY Right 08/30/2013   Procedure: RIGHT  AXILLARY SENTINEL LYMPH NODE BIOPSY; Right Axillary Node Disection;  Surgeon: Stark Klein, MD;  Location: [facility];  Service: General;  Laterality: Right;  Right side nuc med 7:00   . PARTIAL KNEE ARTHROPLASTY Right 11/03/2015   Procedure: RIGHT KNEE MEDIAL  UNICOMPARTMENTAL ARTHROPLASTY ;  Surgeon: Gaynelle Arabian, MD;  Location: [facility];  Service: Orthopedics;  Laterality: Right;  . SIMPLE MASTECTOMY WITH AXILLARY SENTINEL NODE BIOPSY Left 08/30/2013   Procedure: Bilateral Breast Mastectomy ;  Surgeon: Stark Klein, MD;  Location: [facility];  Service: General;  Laterality: Left;  . skin tags removed     breast, panty line, neckline  . TOE SURGERY  preventative crossover toe surg/right foot  . TOE SURGERY  2009   left foot/screw  in 2nd toe  . TONSILLECTOMY    . UPPER GASTROINTESTINAL ENDOSCOPY      Family History  Problem Relation Age of Onset  . Stroke Mother        died age 75  . Diabetes Mother   . Breast cancer Mother 38  . Breast cancer Sister 105  . Breast cancer Paternal Aunt 33  . Diabetes Maternal Grandfather   . Breast cancer Paternal Grandmother 27  . Breast cancer Paternal Aunt        dx in her 30s  . Cancer Maternal Grandmother        intra-abdominal cancer  . Brain cancer Maternal Uncle 8  . Brain cancer Cousin 5       maternal cousin  . Brain cancer Cousin 20       paternal cousin  . Colon cancer Neg Hx     No Known Allergies  Current Outpatient Medications on File Prior to Visit  Medication Sig Dispense Refill  . Calcium Carb-Cholecalciferol (CALCIUM-VITAMIN D) 500-400 MG-UNIT TABS Take by mouth 2 (two) times daily.     . cholecalciferol (VITAMIN D) 1000 units tablet Take 2,000 Units by mouth daily.    Marland Kitchen co-enzyme Q-10 30 MG capsule Take 300 mg by mouth daily.     . diclofenac sodium (VOLTAREN) 1 % GEL as needed.     Marland Kitchen esomeprazole (NEXIUM) 40 MG capsule Take 1 capsule by mouth  daily 90 capsule 1  . levothyroxine (SYNTHROID, LEVOTHROID) 100 MCG tablet Take 1 tablet (100 mcg total) daily before breakfast by mouth. Take one pill in middle of night 90 tablet 3  . LUTEIN PO Take by mouth.    . Melatonin 3 MG CAPS Take by mouth at bedtime.    . meloxicam (MOBIC) 7.5 MG tablet Take 1 tablet (7.5 mg total) daily by  mouth. 90 tablet 1  . methocarbamol (ROBAXIN) 500 MG tablet Take 500 mg by mouth every 6 (six) hours as needed for muscle spasms.    Marland Kitchen omega-3 acid ethyl esters (LOVAZA) 1 g capsule Take 1 capsule (1 g total) 2 (two) times daily by mouth. 180 capsule 3  . oxyCODONE-acetaminophen (PERCOCET/ROXICET) 5-325 MG tablet Take 1 tablet by mouth every 8 (eight) hours as needed for severe pain. 8 tablet 0  . polyethylene glycol (MIRALAX / GLYCOLAX) packet Take 17 g by mouth daily.    . pregabalin (LYRICA) 75 MG capsule Take 1 capsule (75 mg total) by mouth 2 (two) times daily. 180 capsule 0  . saccharomyces boulardii (FLORASTOR) 250 MG capsule Take 250 mg by mouth daily.    . simvastatin (ZOCOR) 10 MG tablet TAKE 1 TABLET BY MOUTH AT BEDTIME 90 tablet 1  . tamoxifen (NOLVADEX) 20 MG tablet Take 1 tablet (20 mg total) daily by mouth. 90 tablet 4  . traMADol (ULTRAM) 50 MG tablet Take 1-2 tablets (50-100 mg total) by mouth every 6 (six) hours as needed (mild pain). 80 tablet 1  . TURMERIC PO Take 1,000 mg by mouth daily.    Marland Kitchen venlafaxine XR (EFFEXOR-XR) 150 MG 24 hr capsule Take 1 capsule (150 mg total) by mouth daily. 90 capsule 0   No current facility-administered medications on file prior to visit.     BP 124/70   Temp 98.2 F (36.8 C) (Oral)   Wt 166 lb (75.3 kg)   BMI 33.53 kg/m  Objective:   Physical Exam  Constitutional: She is oriented to person, place, and time. She appears well-developed and well-nourished.  Cardiovascular: Normal rate, regular rhythm, normal heart sounds and intact distal pulses. Exam reveals no friction rub.  No murmur heard. Pulmonary/Chest: Effort normal and breath sounds normal. No respiratory distress. She has no wheezes. She has no rales. She exhibits no tenderness.  Abdominal: Soft. Bowel sounds are normal. She exhibits no distension and no mass. There is no tenderness. There is no rebound and no guarding.  Musculoskeletal: Normal range of motion. She  exhibits tenderness (along right IT band ). She exhibits no edema or deformity.  Neurological: She is alert and oriented to person, place, and time. She has normal reflexes. She displays normal reflexes. No cranial nerve deficit. She exhibits normal muscle tone. Coordination normal.  Skin: Skin is warm and dry. No rash noted. No erythema. No pallor.  Psychiatric: She has a normal mood and affect. Her behavior is normal. Judgment and thought content normal.  Nursing note and vitals reviewed.     Assessment & Plan:  1. LLQ pain -No pain with palpation in the office today.  Appears to be more muscular.  Advised to continue swimming build up core muscles.  Flexeril might help with this - cyclobenzaprine (FLEXERIL) 10 MG tablet; Take 1 tablet (10 mg total) by mouth 3 (three) times daily as needed for muscle spasms.  Dispense: 90 tablet; Refill: 0  2. Right hip pain -He is to be more muscular in nature possibly IT band syndrome.  Will prescribe Flexeril as she is out of this medication anyway.  Can use stretching exercises, heat, Motrin as needed - cyclobenzaprine (FLEXERIL) 10 MG tablet; Take 1 tablet (10 mg total) by mouth 3 (three) times daily as needed for muscle spasms.  Dispense: 90 tablet; Refill: 0   [name], NP

## 2018-02-26 ENCOUNTER — Encounter (HOSPITAL_BASED_OUTPATIENT_CLINIC_OR_DEPARTMENT_OTHER): Payer: Self-pay | Admitting: Adult Health

## 2018-02-26 ENCOUNTER — Emergency Department (HOSPITAL_BASED_OUTPATIENT_CLINIC_OR_DEPARTMENT_OTHER)
Admission: EM | Admit: 2018-02-26 | Discharge: 2018-02-26 | Disposition: A | Discharge status: Home / Self Care | Payer: Medicare HMO | Attending: Emergency Medicine | Admitting: Emergency Medicine

## 2018-02-26 ENCOUNTER — Emergency Department (HOSPITAL_BASED_OUTPATIENT_CLINIC_OR_DEPARTMENT_OTHER): Payer: Medicare HMO

## 2018-02-26 ENCOUNTER — Other Ambulatory Visit: Payer: Self-pay

## 2018-02-26 DIAGNOSIS — Z79899 Other long term (current) drug therapy: Secondary | ICD-10-CM | POA: Diagnosis not present

## 2018-02-26 DIAGNOSIS — Z96653 Presence of artificial knee joint, bilateral: Secondary | ICD-10-CM | POA: Insufficient documentation

## 2018-02-26 DIAGNOSIS — R079 Chest pain, unspecified: Secondary | ICD-10-CM | POA: Diagnosis not present

## 2018-02-26 DIAGNOSIS — R0602 Shortness of breath: Secondary | ICD-10-CM | POA: Diagnosis not present

## 2018-02-26 DIAGNOSIS — E039 Hypothyroidism, unspecified: Secondary | ICD-10-CM | POA: Diagnosis not present

## 2018-02-26 DIAGNOSIS — R0789 Other chest pain: Secondary | ICD-10-CM | POA: Diagnosis not present

## 2018-02-26 DIAGNOSIS — R1013 Epigastric pain: Secondary | ICD-10-CM | POA: Diagnosis not present

## 2018-02-26 DIAGNOSIS — Z87891 Personal history of nicotine dependence: Secondary | ICD-10-CM | POA: Insufficient documentation

## 2018-02-26 LAB — CBC
HEMATOCRIT: 41.5 % (ref 36.0–46.0)
HEMOGLOBIN: 14.1 g/dL (ref 12.0–15.0)
MCH: 30.5 pg (ref 26.0–34.0)
MCHC: 34 g/dL (ref 30.0–36.0)
MCV: 89.6 fL (ref 78.0–100.0)
Platelets: 177 10*3/uL (ref 150–400)
RBC: 4.63 MIL/uL (ref 3.87–5.11)
RDW: 14 % (ref 11.5–15.5)
WBC: 9.6 10*3/uL (ref 4.0–10.5)

## 2018-02-26 LAB — TROPONIN I: Troponin I: <0.03 ng/mL (ref <0.03)

## 2018-02-26 LAB — HEPATIC FUNCTION PANEL
ALK PHOS: 53 U/L (ref 38–126)
ALT: 69 U/L — AB (ref 14–54)
AST: 123 U/L — AB (ref 15–41)
Albumin: 4 g/dL (ref 3.5–5.0)
BILIRUBIN DIRECT: 0.2 mg/dL (ref 0.1–0.5)
BILIRUBIN TOTAL: 0.7 mg/dL (ref 0.3–1.2)
Indirect Bilirubin: 0.5 mg/dL (ref 0.3–0.9)
Total Protein: 6.7 g/dL (ref 6.5–8.1)

## 2018-02-26 LAB — LIPASE, BLOOD: Lipase: 59 U/L — ABNORMAL HIGH (ref 11–51)

## 2018-02-26 LAB — BASIC METABOLIC PANEL
ANION GAP: 10 (ref 5–15)
BUN: 23 mg/dL — ABNORMAL HIGH (ref 6–20)
CHLORIDE: 106 mmol/L (ref 101–111)
CO2: 22 mmol/L (ref 22–32)
Calcium: 9 mg/dL (ref 8.9–10.3)
Creatinine, Ser: 0.63 mg/dL (ref 0.44–1.00)
GFR calc non Af Amer: >60 mL/min (ref >60)
GLUCOSE: 91 mg/dL (ref 65–99)
POTASSIUM: 3.7 mmol/L (ref 3.5–5.1)
Sodium: 138 mmol/L (ref 135–145)

## 2018-02-26 MED ORDER — SUCRALFATE 1 GM/10ML PO SUSP
ORAL | Status: AC
  Filled 2018-02-26: qty 10

## 2018-02-26 MED ORDER — SUCRALFATE 1 G PO TABS: 1.0000 g | ORAL_TABLET | Freq: Once | ORAL | Status: DC

## 2018-02-26 MED ORDER — SUCRALFATE 1 GM/10ML PO SUSP: 1.0000 g | Freq: Three times a day (TID) | ORAL | 0 refills | Status: DC

## 2018-02-26 MED ORDER — GI COCKTAIL ~~LOC~~: 30.0000 mL | Freq: Once | ORAL | Status: DC

## 2018-02-26 MED ORDER — MORPHINE SULFATE (PF) 2 MG/ML IV SOLN
2.0000 mg | Freq: Once | INTRAVENOUS | Status: AC
  Administered 2018-02-26: 2 mg via INTRAVENOUS
  Filled 2018-02-26: qty 1

## 2018-02-26 MED ORDER — FAMOTIDINE IN NACL 20-0.9 MG/50ML-% IV SOLN
20.0000 mg | Freq: Once | INTRAVENOUS | Status: AC
  Administered 2018-02-26: 20 mg via INTRAVENOUS
  Filled 2018-02-26: qty 50

## 2018-02-26 MED ORDER — SODIUM CHLORIDE 0.9 % IV BOLUS
500.0000 mL | Freq: Once | INTRAVENOUS | Status: AC
  Administered 2018-02-26: 500 mL via INTRAVENOUS

## 2018-02-26 MED ORDER — IOPAMIDOL (ISOVUE-370) INJECTION 76%
100.0000 mL | Freq: Once | INTRAVENOUS | Status: AC | PRN
  Administered 2018-02-26: 100 mL via INTRAVENOUS

## 2018-02-26 MED ORDER — SUCRALFATE 1 GM/10ML PO SUSP
1.0000 g | Freq: Three times a day (TID) | ORAL | Status: DC
  Administered 2018-02-26: 1 g via ORAL

## 2018-02-26 NOTE — ED Notes (Signed)
Alert, NAD, calm, updated, pending troponin results, pepcid infusing. Family at [facility].

## 2018-02-26 NOTE — ED Triage Notes (Addendum)
PResents with sudden onset of sharp pain at the sternum and epigastric area that is described as intermittent and severe associated with gas and SOB. PT reports that is is the most pain she has felt. She has a history of a hiatal hernia. SHe states she was just sitting when the pain began. Denies nausea and pain radiation to back, arm or neck. Lying flat is helping the pain. Sitting up was making it worse

## 2018-02-26 NOTE — ED Provider Notes (Signed)
[facility] EMERGENCY DEPARTMENT Provider Note   CSN: [id number] Arrival date & time: 02/26/18  1730     History   Chief Complaint Chief Complaint  Patient presents with  . Chest Pain    HPI Jean Nash is a 77 y.o. female.  The history is provided by the patient. No language interpreter was used.  Chest Pain      Jean Nash is a 77 y.o. female who presents to the Emergency Department complaining of chest pain. He presents to the emergency department accompanied by her husband for evaluation of sudden onset chest pain that occurred just prior to ED arrival. She was sitting on the couch when she developed sudden, severe central chest pain. Pain was stabbing and sharp in nature. Overall the pain has decreased somewhat but it has radiated to her chest and abdomen. It does not radiates to the back or shoulders. She has associated shortness of breath. She denies any nausea, vomiting, diarrhea, leg swelling or pain. No prior similar symptoms. She does have a history of stage III breast cancer. No history of DVT or cardiac disease.  Past Medical History:  Diagnosis Date  . Anxiety    PHOBIAS  . Arthritis   . Breast cancer ([place]) 08/08/13   right LOQ  . Cataract   . Chronic insomnia   . Cluster headaches    history of migraines / NONE FOR SEVERAL YRS  . Depression   . Fibromyalgia   . GERD (gastroesophageal reflux disease)   . H/O hiatal hernia   . History of colonic polyps   . History of transfusion 08/30/2013  . Hx of radiation therapy 10/29/13- 12/14/13   right chest wall 5040 cGy 28 sessions, right supraclavicular/axillary region 5040 cGy 28 sessions, right chest wall boost 1000 cGy 5 sessions  . Hypothyroidism   . Irritable bowel syndrome   . Kidney stone   . Lymphedema    RT ARM - WEARS SLEEVE  . Macular degeneration    hole/right eye  . Osteopenia   . Other abnormal glucose   . Other and unspecified hyperlipidemia   . Pain in joint, shoulder region     . Pneumonia 2831,5176  . Sleep apnea    USES C-PAP  . Stress incontinence, female     Patient Active Problem List   Diagnosis Date Noted  . MDD (major depressive disorder), recurrent episode, mild ([place]) 12/22/2017  . Osteoporosis 07/14/2017  . OA (osteoarthritis) of knee 11/03/2015  . Osteopenia 08/08/2015  . Obstructive sleep apnea 06/25/2015  . Headache 02/03/2015  . Atypical chest pain 07/15/2014  . Arthralgia 02/22/2014  . Psoriasis 02/22/2014  . Malignant neoplasm of lower-outer quadrant of right breast of female, estrogen receptor positive ([place]) 08/09/2013  . Neck pain 06/22/2013  . Left knee pain 11/29/2012  . Gastroenteritis 04/26/2012  . Reactive depression (situational) 04/26/2012  . Right wrist pain 02/02/2012  . Right foot pain 10/04/2011  . Nevus 06/05/2011  . Status post total knee replacement 04/06/2011  . Overactive bladder 03/14/2011  . KNEE PAIN, LEFT 08/31/2010  . HEARING LOSS 08/17/2010  . HIRSUTISM 06/16/2009  . CYST, IDIOPATHIC 05/20/2008  . IRRITABLE BOWEL SYNDROME 04/15/2008  . Allergic asthma, mild intermittent, uncomplicated 16/05/3709  . GERD 03/19/2008  . COLONIC POLYPS, HX OF 03/19/2008  . NEPHROLITHIASIS, HX OF 03/19/2008  . Hypothyroidism 03/18/2008  . Hyperlipidemia 03/18/2008  . INSOMNIA, CHRONIC 03/18/2008  . FIBROMYALGIA 03/18/2008  . Prediabetes 03/18/2008    Past Surgical History:  Procedure  Laterality Date  . ABDOMINAL HYSTERECTOMY    . APPENDECTOMY    . BILATERAL TOTAL MASTECTOMY WITH AXILLARY LYMPH NODE DISSECTION  08/30/2013   Dr Barry Dienes  . BREAST CYST ASPIRATION     9 cysts  . CATARACT EXTRACTION, BILATERAL  2005/2007  . CHOLECYSTECTOMY    . COLONOSCOPY    . EVACUATION BREAST HEMATOMA Left 08/31/2013   Procedure: EVACUATION HEMATOMA BREAST;  Surgeon: Stark Klein, MD;  Location: [facility];  Service: General;  Laterality: Left;  . EYE SURGERY     to repair macular hole  . FOOT ARTHROPLASTY     lt   . GANGLION CYST  EXCISION     rt foot  . HEMORRHOID SURGERY     03/1993  . JOINT REPLACEMENT  03/15/11   left knee replacement  . KNEE ARTHROSCOPY     /partial knee 2016/left knee 2012  . MASS EXCISION  11/04/2011   Procedure: EXCISION MASS;  Surgeon: Cammie Sickle., MD;  Location: [facility];  Service: Orthopedics;  Laterality: Right;  excisional biopsy right ulna mass  . MASTECTOMY W/ SENTINEL NODE BIOPSY Right 08/30/2013   Procedure: RIGHT  AXILLARY SENTINEL LYMPH NODE BIOPSY; Right Axillary Node Disection;  Surgeon: Stark Klein, MD;  Location: [facility];  Service: General;  Laterality: Right;  Right side nuc med 7:00   . PARTIAL KNEE ARTHROPLASTY Right 11/03/2015   Procedure: RIGHT KNEE MEDIAL UNICOMPARTMENTAL ARTHROPLASTY ;  Surgeon: Gaynelle Arabian, MD;  Location: [facility];  Service: Orthopedics;  Laterality: Right;  . SIMPLE MASTECTOMY WITH AXILLARY SENTINEL NODE BIOPSY Left 08/30/2013   Procedure: Bilateral Breast Mastectomy ;  Surgeon: Stark Klein, MD;  Location: [facility];  Service: General;  Laterality: Left;  . skin tags removed     breast, panty line, neckline  . TOE SURGERY     preventative crossover toe surg/right foot  . TOE SURGERY  2009   left foot/screw  in 2nd toe  . TONSILLECTOMY    . UPPER GASTROINTESTINAL ENDOSCOPY       OB History   None      Home Medications    Prior to Admission medications   Medication Sig Start Date End Date Taking? Authorizing Provider  Calcium Carb-Cholecalciferol (CALCIUM-VITAMIN D) 500-400 MG-UNIT TABS Take by mouth 2 (two) times daily.     [provider]  cholecalciferol (VITAMIN D) 1000 units tablet Take 2,000 Units by mouth daily.    [provider]  co-enzyme Q-10 30 MG capsule Take 300 mg by mouth daily.     [provider]  cyclobenzaprine (FLEXERIL) 10 MG tablet Take 1 tablet (10 mg total) by mouth 3 (three) times daily as needed for muscle spasms. 02/24/18   Nafziger, Tommi Rumps, NP  diclofenac sodium  (VOLTAREN) 1 % GEL as needed.  11/16/16   [provider]  esomeprazole (NEXIUM) 40 MG capsule Take 1 capsule by mouth  daily 01/26/17   Nafziger, Tommi Rumps, NP  levothyroxine (SYNTHROID, LEVOTHROID) 100 MCG tablet Take 1 tablet (100 mcg total) daily before breakfast by mouth. Take one pill in middle of night 09/28/17   [name], NP  LUTEIN PO Take by mouth.    [provider]  Melatonin 3 MG CAPS Take by mouth at bedtime.    [provider]  meloxicam (MOBIC) 7.5 MG tablet Take 1 tablet (7.5 mg total) daily by mouth. 09/28/17   Nafziger, Tommi Rumps, NP  methocarbamol (ROBAXIN) 500 MG tablet Take 500 mg by mouth every  6 (six) hours as needed for muscle spasms.    [provider]  omega-3 acid ethyl esters (LOVAZA) 1 g capsule Take 1 capsule (1 g total) 2 (two) times daily by mouth. 09/21/17   Nafziger, Tommi Rumps, NP  oxyCODONE-acetaminophen (PERCOCET/ROXICET) 5-325 MG tablet Take 1 tablet by mouth every 8 (eight) hours as needed for severe pain. 11/07/17   Avie Echevaria B, PA-C  polyethylene glycol (MIRALAX / GLYCOLAX) packet Take 17 g by mouth daily.    [provider]  pregabalin (LYRICA) 75 MG capsule Take 1 capsule (75 mg total) by mouth 2 (two) times daily. 02/08/18   Nafziger, Tommi Rumps, NP  saccharomyces boulardii (FLORASTOR) 250 MG capsule Take 250 mg by mouth daily.    [provider]  simvastatin (ZOCOR) 10 MG tablet TAKE 1 TABLET BY MOUTH AT BEDTIME 02/16/18   Nafziger, Tommi Rumps, NP  sucralfate (CARAFATE) 1 GM/10ML suspension Take 10 mLs (1 g total) by mouth 4 (four) times daily -  with meals and at bedtime. 02/26/18   Quintella Reichert, MD  tamoxifen (NOLVADEX) 20 MG tablet Take 1 tablet (20 mg total) daily by mouth. 10/03/17   Magrinat, Virgie Dad, MD  traMADol (ULTRAM) 50 MG tablet Take 1-2 tablets (50-100 mg total) by mouth every 6 (six) hours as needed (mild pain). 11/04/15   Perkins, Alexzandrew L, PA-C  TURMERIC PO Take 1,000 mg by mouth daily.     [provider]  venlafaxine XR (EFFEXOR-XR) 150 MG 24 hr capsule Take 1 capsule (150 mg total) by mouth daily. 02/13/18   Norman Clay, MD    Family History Family History  Problem Relation Age of Onset  . Stroke Mother        died age 58  . Diabetes Mother   . Breast cancer Mother 7  . Breast cancer Sister 25  . Breast cancer Paternal Aunt 21  . Diabetes Maternal Grandfather   . Breast cancer Paternal Grandmother 73  . Breast cancer Paternal Aunt        dx in her 75s  . Cancer Maternal Grandmother        intra-abdominal cancer  . Brain cancer Maternal Uncle 8  . Brain cancer Cousin 24       maternal cousin  . Brain cancer Cousin 20       paternal cousin  . Colon cancer Neg Hx     Social History Social History   Tobacco Use  . Smoking status: Former Smoker    Packs/day: 0.10    Years: 2.00    Pack years: 0.20    Types: Cigarettes    Start date: 11/16/1959    Last attempt to quit: 11/15/1960    Years since quitting: 57.3  . Smokeless tobacco: Never Used  Substance Use Topics  . Alcohol use: No    Alcohol/week: 0.0 oz  . Drug use: No     Allergies   Patient has no known allergies.   Review of Systems Review of Systems  Cardiovascular: Positive for chest pain.  All other systems reviewed and are negative.    Physical Exam Updated Vital Signs BP 127/60   Pulse (!) 58   Temp 97.6 F (36.4 C) (Oral)   Resp 11   SpO2 96%   Physical Exam  Constitutional: She is oriented to person, place, and time. She appears well-developed and well-nourished.  HENT:  Head: Normocephalic and atraumatic.  Cardiovascular: Normal rate and regular rhythm.  No murmur heard. Pulmonary/Chest: Effort normal and breath  sounds normal. No respiratory distress.  Abdominal: Soft. There is no rebound and no guarding.  Mild generalized abdominal tenderness  Musculoskeletal: She exhibits no edema or tenderness.  Neurological: She is alert and oriented to person, place, and  time.  Skin: Skin is warm and dry.  Psychiatric: She has a normal mood and affect. Her behavior is normal.  Nursing note and vitals reviewed.    ED Treatments / Results  Labs (all labs ordered are listed, but only abnormal results are displayed) Labs Reviewed  BASIC METABOLIC PANEL - Abnormal; Notable for the following components:      Result Value   BUN 23 (*)    All other components within normal limits  HEPATIC FUNCTION PANEL - Abnormal; Notable for the following components:   AST 123 (*)    ALT 69 (*)    All other components within normal limits  LIPASE, BLOOD - Abnormal; Notable for the following components:   Lipase 59 (*)    All other components within normal limits  CBC  TROPONIN I  TROPONIN I    EKG EKG Interpretation  Date/Time:  Sunday February 26 2018 17:41:59 EDT Ventricular Rate:  64 PR Interval:    QRS Duration: 113 QT Interval:  449 QTC Calculation: 464 R Axis:   30 Text Interpretation:  Sinus rhythm Incomplete right bundle branch block Confirmed by Quintella Reichert [id number]) on 02/26/2018 5:45:24 PM   Radiology Dg Chest 2 View  Result Date: 02/26/2018 CLINICAL DATA:  Mid to lower chest pain. EXAM: CHEST - 2 VIEW COMPARISON:  January 25, 2018 FINDINGS: Minimal atelectasis in the left base. No pneumothorax. Previous axillary dissection. The heart, hila, mediastinum, lungs, and pleura are otherwise normal. IMPRESSION: Minimal atelectasis in the left base.  No other acute abnormalities. Electronically Signed   By: Dorise Bullion III M.D   On: 02/26/2018 18:33   Ct Angio Chest/abd/pel For Dissection W And/or W/wo  Result Date: 02/26/2018 CLINICAL DATA:  Epigastric pain EXAM: CT ANGIOGRAPHY CHEST, ABDOMEN AND PELVIS TECHNIQUE: Multidetector CT imaging through the chest, abdomen and pelvis was performed using the standard protocol during bolus administration of intravenous contrast. Multiplanar reconstructed images and MIPs were obtained and reviewed to evaluate the  vascular anatomy. CONTRAST:  130mL ISOVUE-370 IOPAMIDOL (ISOVUE-370) INJECTION 76% COMPARISON:  Chest x-ray 02/26/2018, CT chest 10/08/2013 FINDINGS: CTA CHEST FINDINGS Cardiovascular: Non contrasted images demonstrate no intramural hematoma. Moderate aortic atherosclerosis. Minimal coronary vascular calcification. No aneurysmal dilatation. No dissection. Normal heart size. No pericardial effusion. Common origin of the right brachiocephalic and left common carotid artery from the arch. Mediastinum/Nodes: Midline trachea. Coarse calcification right lobe of thyroid. No significantly enlarged lymph nodes. Esophagus within normal limits Lungs/Pleura: Mild right apical and right greater than left anterior subpleural fibrosis. No acute consolidation or effusion. No pneumothorax. Musculoskeletal: Degenerative changes. No acute or suspicious abnormality. Bilateral mastectomy changes. Review of the MIP images confirms the above findings. CTA ABDOMEN AND PELVIS FINDINGS VASCULAR Aorta: No aneurysm or dissection. Mild to moderate aortic atherosclerosis. Celiac: Patent without evidence of aneurysm, dissection, vasculitis or significant stenosis. SMA: Patent without evidence of aneurysm, dissection, vasculitis or significant stenosis. Renals: Single right and single left patent renal arteries. IMA: Patent without evidence of aneurysm, dissection, vasculitis or significant stenosis. Inflow: Patent without evidence of aneurysm, dissection, vasculitis or significant stenosis. Veins: No obvious venous abnormality within the limitations of this arterial phase study. Review of the MIP images confirms the above findings. NON-VASCULAR Hepatobiliary: No focal hepatic abnormality. Surgical clips at  the gallbladder fossa. Enlarged extrahepatic bile duct up to 17 mm. Pancreas: Unremarkable. No pancreatic ductal dilatation or surrounding inflammatory changes. Spleen: Normal in size without focal abnormality. Adrenals/Urinary Tract: Adrenal  glands are unremarkable. Kidneys are normal, without renal calculi, focal lesion, or hydronephrosis. Bladder is unremarkable. Prominent renal pelvises. No stones identified. Stomach/Bowel: Stomach is within normal limits. Appendix not well seen but no right lower quadrant inflammation. Sigmoid colon diverticular disease without acute inflammatory process. No evidence of bowel wall thickening, distention, or inflammatory changes. Lymphatic: No significantly enlarged lymph nodes Reproductive: Status post hysterectomy. No adnexal masses. Other: Negative for free air or free fluid. Small fat in the umbilical region. Musculoskeletal: Grade 1 anterolisthesis L3 on L4 and L4 on L5. Review of the MIP images confirms the above findings. IMPRESSION: 1. Negative for aortic aneurysm or dissection. 2. No acute pulmonary infiltrates or effusion. 3. Aortic atherosclerosis without significant stenosis or occlusive vascular disease in the abdomen or pelvis. 4. Status post cholecystectomy with enlarged extrahepatic bile duct, likely due to surgical change. Suggest correlation with LFTs. 5. Sigmoid colon diverticular disease without acute inflammation Electronically Signed   By: Donavan Foil M.D.   On: 02/26/2018 20:31    Procedures Procedures (including critical care time)  Medications Ordered in ED Medications  sucralfate (CARAFATE) 1 GM/10ML suspension 1 g (1 g Oral Given 02/26/18 2240)  sodium chloride 0.9 % bolus 500 mL (0 mLs Intravenous Stopped 02/26/18 1939)  morphine 2 MG/ML injection 2 mg (2 mg Intravenous Given 02/26/18 1851)  iopamidol (ISOVUE-370) 76 % injection 100 mL (100 mLs Intravenous Contrast Given 02/26/18 1947)  famotidine (PEPCID) IVPB 20 mg premix (0 mg Intravenous Stopped 02/26/18 2226)  morphine 2 MG/ML injection 2 mg (2 mg Intravenous Given 02/26/18 2253)     Initial Impression / Assessment and Plan / ED Course  I have reviewed the triage vital signs and the nursing notes.  Pertinent labs &  imaging results that were available during my care of the patient were reviewed by me and considered in my medical decision making (see chart for details).     Patient here for evaluation of epigastric pain rating to the chest and diffuse abdomen. EKG with no acute ischemic changes. Given sudden onset sharp pain CTA was obtained. CTA is negative for dissection, PE. No evidence of acute pneumonia or intra abdominal process. Labs are near her baseline. Presentation is not consistent with ACS. After treatment with Pepcid her symptoms are significantly improved. Discussed with patient possible gastritis. Discussed home care with PPI and Carafate. Discussed outpatient follow-up and return precautions.  Final Clinical Impressions(s) / ED Diagnoses   Final diagnoses:  Nonspecific chest pain  Epigastric pain    ED Discharge Orders        Ordered    sucralfate (CARAFATE) 1 GM/10ML suspension  3 times daily with meals & bedtime     02/26/18 2232       Quintella Reichert, MD 02/27/18 [phone]

## 2018-02-26 NOTE — ED Notes (Addendum)
Alert, NAD, calm, interactive, resps e/u, speaking in clear complete sentences, no dyspnea noted, skin W&D, VSS, mentions pain lessened and a little light headed, states, "feel better", (denies: sob, nausea, dizziness or visual changes). Family at [facility]. Pt up to b/r steady gait. CT present for pt.

## 2018-02-26 NOTE — ED Notes (Signed)
Xray delay, RN starting IV. Will return

## 2018-02-26 NOTE — ED Notes (Signed)
MD at bedside. 

## 2018-02-28 ENCOUNTER — Encounter: Payer: Self-pay | Admitting: Adult Health

## 2018-02-28 ENCOUNTER — Ambulatory Visit (INDEPENDENT_AMBULATORY_CARE_PROVIDER_SITE_OTHER): Payer: Medicare HMO | Admitting: Adult Health

## 2018-02-28 VITALS — BP 122/64 | Temp 98.1°F | Wt 164.0 lb

## 2018-02-28 DIAGNOSIS — R1013 Epigastric pain: Secondary | ICD-10-CM | POA: Diagnosis not present

## 2018-02-28 NOTE — Progress Notes (Signed)
Subjective:    [name], female    DOB: 04-May-1941, 77 y.o.   MRN: [id number]  HPI  77 year old female who  has a past medical history of Anxiety, Arthritis, Breast cancer ([place]) (08/08/13), Cataract, Chronic insomnia, Cluster headaches, Depression, Fibromyalgia, GERD (gastroesophageal reflux disease), H/O hiatal hernia, History of colonic polyps, History of transfusion (08/30/2013), radiation therapy (10/29/13- 12/14/13), Hypothyroidism, Irritable bowel syndrome, Kidney stone, Lymphedema, Macular degeneration, Osteopenia, Other abnormal glucose, Other and unspecified hyperlipidemia, Pain in joint, shoulder region, Pneumonia ([id number]), Sleep apnea, and Stress incontinence, female.  She presents to the office today for follow up after being seen in the ER two days ago complaining of chest pain. Chest pain was sudden onset and had occurred just prior to arrival. She was sitting on the couch when the chest pain started. Pain is described as sharp and stabbing.   EKG did not show any ischemic changes. CTA was negative for dissection or PE. Labs were near baseline. She was given Pepcid and her symptoms improved significantly. She was discharged home with Carafate and to continues PPI.   Today in the office she reports that she has not had any symptoms since being discharged from the hospital. She did not fill the prescription for Carafate. Patient reports that she took her medications on an empty stomach and believes that this is what caused her pain   Review of Systems See HPI   Past Medical History:  Diagnosis Date  . Anxiety    PHOBIAS  . Arthritis   . Breast cancer ([place]) 08/08/13   right LOQ  . Cataract   . Chronic insomnia   . Cluster headaches    history of migraines / NONE FOR SEVERAL YRS  . Depression   . Fibromyalgia   . GERD (gastroesophageal reflux disease)   . H/O hiatal hernia   . History of colonic polyps   . History of transfusion 08/30/2013  . Hx of  radiation therapy 10/29/13- 12/14/13   right chest wall 5040 cGy 28 sessions, right supraclavicular/axillary region 5040 cGy 28 sessions, right chest wall boost 1000 cGy 5 sessions  . Hypothyroidism   . Irritable bowel syndrome   . Kidney stone   . Lymphedema    RT ARM - WEARS SLEEVE  . Macular degeneration    hole/right eye  . Osteopenia   . Other abnormal glucose   . Other and unspecified hyperlipidemia   . Pain in joint, shoulder region   . Pneumonia [id number]  . Sleep apnea    USES C-PAP  . Stress incontinence, female     Social History   Socioeconomic History  . Marital status: Married    Spouse name: Not on file  . Number of children: 3  . Years of education: Not on file  . Highest education level: Not on file  Occupational History  . Occupation: retired Medical laboratory scientific officer  . Financial resource strain: Not on file  . Food insecurity:    Worry: Not on file    Inability: Not on file  . Transportation needs:    Medical: Not on file    Non-medical: Not on file  Tobacco Use  . Smoking status: Former Smoker    Packs/day: 0.10    Years: 2.00    Pack years: 0.20    Types: Cigarettes    Start date: 11/16/1959    Last attempt to quit: 11/15/1960    Years since quitting: 57.3  . Smokeless tobacco: Never  Used  Substance and Sexual Activity  . Alcohol use: No    Alcohol/week: 0.0 oz  . Drug use: No  . Sexual activity: Not on file    Comment: menarche age 76, fist live birth 26, P 3, hysterectomy age 81, no HRT, BCP 2 yrs  Lifestyle  . Physical activity:    Days per week: Not on file    Minutes per session: Not on file  . Stress: Not on file  Relationships  . Social connections:    Talks on phone: Not on file    Gets together: Not on file    Attends religious service: Not on file    Active member of club or organization: Not on file    Attends meetings of clubs or organizations: Not on file    Relationship status: Not on file  . Intimate partner violence:     Fear of current or ex partner: Not on file    Emotionally abused: Not on file    Physically abused: Not on file    Forced sexual activity: Not on file  Other Topics Concern  . 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 ABDOMINAL HYSTERECTOMY    . APPENDECTOMY    . BILATERAL TOTAL MASTECTOMY WITH AXILLARY LYMPH NODE DISSECTION  08/30/2013   Dr Barry Dienes  . BREAST CYST ASPIRATION     9 cysts  . CATARACT EXTRACTION, BILATERAL  2005/2007  . CHOLECYSTECTOMY    . COLONOSCOPY    . EVACUATION BREAST HEMATOMA Left 08/31/2013   Procedure: EVACUATION HEMATOMA BREAST;  Surgeon: Stark Klein, MD;  Location: [facility];  Service: General;  Laterality: Left;  . EYE SURGERY     to repair macular hole  . FOOT ARTHROPLASTY     lt   . GANGLION CYST EXCISION     rt foot  . HEMORRHOID SURGERY     03/1993  . JOINT REPLACEMENT  03/15/11   left knee replacement  . KNEE ARTHROSCOPY     /partial knee 2016/left knee 2012  . MASS EXCISION  11/04/2011   Procedure: EXCISION MASS;  Surgeon: Cammie Sickle., MD;  Location: [facility];  Service: Orthopedics;  Laterality: Right;  excisional biopsy right ulna mass  . MASTECTOMY W/ SENTINEL NODE BIOPSY Right 08/30/2013   Procedure: RIGHT  AXILLARY SENTINEL LYMPH NODE BIOPSY; Right Axillary Node Disection;  Surgeon: Stark Klein, MD;  Location: [facility];  Service: General;  Laterality: Right;  Right side nuc med 7:00   . PARTIAL KNEE ARTHROPLASTY Right 11/03/2015   Procedure: RIGHT KNEE MEDIAL UNICOMPARTMENTAL ARTHROPLASTY ;  Surgeon: Gaynelle Arabian, MD;  Location: [facility];  Service: Orthopedics;  Laterality: Right;  . SIMPLE MASTECTOMY WITH AXILLARY SENTINEL NODE BIOPSY Left 08/30/2013   Procedure: Bilateral Breast Mastectomy ;  Surgeon: Stark Klein, MD;  Location: [facility];  Service: General;   Laterality: Left;  . skin tags removed     breast, panty line, neckline  . TOE SURGERY     preventative crossover toe surg/right foot  . TOE SURGERY  2009   left foot/screw  in 2nd toe  . TONSILLECTOMY    . UPPER GASTROINTESTINAL ENDOSCOPY      Family History  Problem Relation Age of Onset  .  Stroke Mother        died age 72  . Diabetes Mother   . Breast cancer Mother 22  . Breast cancer Sister 11  . Breast cancer Paternal Aunt 74  . Diabetes Maternal Grandfather   . Breast cancer Paternal Grandmother 41  . Breast cancer Paternal Aunt        dx in her 53s  . Cancer Maternal Grandmother        intra-abdominal cancer  . Brain cancer Maternal Uncle 8  . Brain cancer Cousin 85       maternal cousin  . Brain cancer Cousin 20       paternal cousin  . Colon cancer Neg Hx     No Known Allergies  Current Outpatient Medications on File Prior to Visit  Medication Sig Dispense Refill  . Calcium Carb-Cholecalciferol (CALCIUM-VITAMIN D) 500-400 MG-UNIT TABS Take by mouth 2 (two) times daily.     . cholecalciferol (VITAMIN D) 1000 units tablet Take 2,000 Units by mouth daily.    Marland Kitchen co-enzyme Q-10 30 MG capsule Take 300 mg by mouth daily.     . cyclobenzaprine (FLEXERIL) 10 MG tablet Take 1 tablet (10 mg total) by mouth 3 (three) times daily as needed for muscle spasms. 90 tablet 0  . diclofenac sodium (VOLTAREN) 1 % GEL as needed.     Marland Kitchen esomeprazole (NEXIUM) 40 MG capsule Take 1 capsule by mouth  daily 90 capsule 1  . levothyroxine (SYNTHROID, LEVOTHROID) 100 MCG tablet Take 1 tablet (100 mcg total) daily before breakfast by mouth. Take one pill in middle of night 90 tablet 3  . LUTEIN PO Take by mouth.    . Melatonin 3 MG CAPS Take by mouth at bedtime.    . meloxicam (MOBIC) 7.5 MG tablet Take 1 tablet (7.5 mg total) daily by mouth. 90 tablet 1  . methocarbamol (ROBAXIN) 500 MG tablet Take 500 mg by mouth every 6 (six) hours as needed for muscle spasms.    Marland Kitchen omega-3 acid ethyl esters  (LOVAZA) 1 g capsule Take 1 capsule (1 g total) 2 (two) times daily by mouth. 180 capsule 3  . oxyCODONE-acetaminophen (PERCOCET/ROXICET) 5-325 MG tablet Take 1 tablet by mouth every 8 (eight) hours as needed for severe pain. 8 tablet 0  . polyethylene glycol (MIRALAX / GLYCOLAX) packet Take 17 g by mouth daily.    . pregabalin (LYRICA) 75 MG capsule Take 1 capsule (75 mg total) by mouth 2 (two) times daily. 180 capsule 0  . saccharomyces boulardii (FLORASTOR) 250 MG capsule Take 250 mg by mouth daily.    . simvastatin (ZOCOR) 10 MG tablet TAKE 1 TABLET BY MOUTH AT BEDTIME 90 tablet 1  . sucralfate (CARAFATE) 1 GM/10ML suspension Take 10 mLs (1 g total) by mouth 4 (four) times daily -  with meals and at bedtime. 420 mL 0  . tamoxifen (NOLVADEX) 20 MG tablet Take 1 tablet (20 mg total) daily by mouth. 90 tablet 4  . traMADol (ULTRAM) 50 MG tablet Take 1-2 tablets (50-100 mg total) by mouth every 6 (six) hours as needed (mild pain). 80 tablet 1  . TURMERIC PO Take 1,000 mg by mouth daily.    Marland Kitchen venlafaxine XR (EFFEXOR-XR) 150 MG 24 hr capsule Take 1 capsule (150 mg total) by mouth daily. 90 capsule 0   No current facility-administered medications on file prior to visit.     BP 122/64   Temp 98.1 F (36.7 C) (Oral)   Wt  164 lb (74.4 kg)   BMI 33.12 kg/m       Objective:   Physical Exam  Constitutional: She is oriented to person, place, and time. She appears well-developed and well-nourished. No distress.  Cardiovascular: Normal rate, regular rhythm, normal heart sounds and intact distal pulses. Exam reveals no gallop.  No murmur heard. Pulmonary/Chest: Effort normal and breath sounds normal. No respiratory distress. She has no wheezes. She has no rales. She exhibits no tenderness.  Abdominal: Soft. Bowel sounds are normal. She exhibits no distension. There is no tenderness. There is no rebound and no guarding.  Neurological: She is alert and oriented to person, place, and time.  Skin:  Skin is warm and dry. No rash noted. She is not diaphoretic. No erythema. No pallor.  Psychiatric: She has a normal mood and affect. Her behavior is normal. Judgment and thought content normal.  Nursing note and vitals reviewed.     Assessment & Plan:  1. Epigastric pain - Reviewed labs and imaging from ER visit  - Advised to take medications with food  - Follow up as needed  [name], NP

## 2018-03-01 ENCOUNTER — Telehealth: Payer: Self-pay | Admitting: *Deleted

## 2018-03-01 NOTE — Telephone Encounter (Signed)
Prior auth for Cyclobenzaprine 10mg  sent to [email or web address].

## 2018-03-10 ENCOUNTER — Ambulatory Visit: Payer: Medicare HMO | Admitting: Psychology

## 2018-03-10 NOTE — Telephone Encounter (Signed)
Fax received from [facility] stating the request was approved from 11/13/2017-11/13/2018.  I called [facility] and informed Estill Bamberg of this.

## 2018-04-06 ENCOUNTER — Encounter: Payer: Self-pay | Admitting: Oncology

## 2018-05-10 DIAGNOSIS — H35341 Macular cyst, hole, or pseudohole, right eye: Secondary | ICD-10-CM | POA: Diagnosis not present

## 2018-05-10 NOTE — Progress Notes (Signed)
[facility] MD/PA/NP OP Progress Note  05/15/2018 10:56 AM Jean Nash  MRN:  [id number]  Chief Complaint:  Chief Complaint    Depression; Follow-up     HPI:  Patient presents for follow-up appointment for depression.  She states that she feels fantastic since the last appointment.  Although she did have stress about the pool at home and chronic pain from arthritis, she believes that she has been managing it well. Although her mother's birthday was in June, she felt fine.  She reports insomnia, and she stopped using CPAP machine as she noticed no difference even when she use the machine.  She is planning to talk with her PCP. She has good appetite and eats healthier food. She has good concentration.  She denies SI.  She denies anxiety or panic attacks.  She denies irritability.    Visit Diagnosis:    ICD-10-CM   1. MDD (major depressive disorder), recurrent episode, mild ([place]) F33.0     Past Psychiatric History: Please see initial evaluation for full details. I have reviewed the history. No updates at this time.     Past Medical History:  Past Medical History:  Diagnosis Date  . Anxiety    PHOBIAS  . Arthritis   . Breast cancer ([place]) 08/08/13   right LOQ  . Cataract   . Chronic insomnia   . Cluster headaches    history of migraines / NONE FOR SEVERAL YRS  . Depression   . Fibromyalgia   . GERD (gastroesophageal reflux disease)   . H/O hiatal hernia   . History of colonic polyps   . History of transfusion 08/30/2013  . Hx of radiation therapy 10/29/13- 12/14/13   right chest wall 5040 cGy 28 sessions, right supraclavicular/axillary region 5040 cGy 28 sessions, right chest wall boost 1000 cGy 5 sessions  . Hypothyroidism   . Irritable bowel syndrome   . Kidney stone   . Lymphedema    RT ARM - WEARS SLEEVE  . Macular degeneration    hole/right eye  . Osteopenia   . Other abnormal glucose   . Other and unspecified hyperlipidemia   . Pain in joint, shoulder region   . Pneumonia  [id number]  . Sleep apnea    USES C-PAP  . Stress incontinence, female     Past Surgical History:  Procedure Laterality Date  . ABDOMINAL HYSTERECTOMY    . APPENDECTOMY    . BILATERAL TOTAL MASTECTOMY WITH AXILLARY LYMPH NODE DISSECTION  08/30/2013   Dr Barry Dienes  . BREAST CYST ASPIRATION     9 cysts  . CATARACT EXTRACTION, BILATERAL  2005/2007  . CHOLECYSTECTOMY    . COLONOSCOPY    . EVACUATION BREAST HEMATOMA Left 08/31/2013   Procedure: EVACUATION HEMATOMA BREAST;  Surgeon: Stark Klein, MD;  Location: [facility];  Service: General;  Laterality: Left;  . EYE SURGERY     to repair macular hole  . FOOT ARTHROPLASTY     lt   . GANGLION CYST EXCISION     rt foot  . HEMORRHOID SURGERY     03/1993  . JOINT REPLACEMENT  03/15/11   left knee replacement  . KNEE ARTHROSCOPY     /partial knee 2016/left knee 2012  . MASS EXCISION  11/04/2011   Procedure: EXCISION MASS;  Surgeon: Cammie Sickle., MD;  Location: [facility];  Service: Orthopedics;  Laterality: Right;  excisional biopsy right ulna mass  . MASTECTOMY W/ SENTINEL NODE BIOPSY Right 08/30/2013   Procedure: RIGHT  AXILLARY SENTINEL LYMPH NODE BIOPSY; Right Axillary Node Disection;  Surgeon: Stark Klein, MD;  Location: [facility];  Service: General;  Laterality: Right;  Right side nuc med 7:00   . PARTIAL KNEE ARTHROPLASTY Right 11/03/2015   Procedure: RIGHT KNEE MEDIAL UNICOMPARTMENTAL ARTHROPLASTY ;  Surgeon: Gaynelle Arabian, MD;  [facility];  Service: Orthopedics;  Laterality: Right;  . SIMPLE MASTECTOMY WITH AXILLARY SENTINEL NODE BIOPSY Left 08/30/2013   Procedure: Bilateral Breast Mastectomy ;  Surgeon: Stark Klein, MD;  Location: [facility];  Service: General;  Laterality: Left;  . skin tags removed     breast, panty line, neckline  . TOE SURGERY     preventative crossover toe surg/right foot  . TOE SURGERY  2009   left foot/screw  in 2nd toe  . TONSILLECTOMY    . UPPER GASTROINTESTINAL ENDOSCOPY      Family  Psychiatric History: Please see initial evaluation for full details. I have reviewed the history. No updates at this time.     Family History:  Family History  Problem Relation Age of Onset  . Stroke Mother        died age 65  . Diabetes Mother   . Breast cancer Mother 47  . Breast cancer Sister 29  . Breast cancer Paternal Aunt 76  . Diabetes Maternal Grandfather   . Breast cancer Paternal Grandmother 86  . Breast cancer Paternal Aunt        dx in her 6s  . Cancer Maternal Grandmother        intra-abdominal cancer  . Brain cancer Maternal Uncle 8  . Brain cancer Cousin 80       maternal cousin  . Brain cancer Cousin 20       paternal cousin  . Colon cancer Neg Hx     Social History:  Social History   Socioeconomic History  . Marital status: Married    Spouse name: Not on file  . Number of children: 3  . Years of education: Not on file  . Highest education level: Not on file  Occupational History  . Occupation: retired Medical laboratory scientific officer  . Financial resource strain: Not on file  . Food insecurity:    Worry: Not on file    Inability: Not on file  . Transportation needs:    Medical: Not on file    Non-medical: Not on file  Tobacco Use  . Smoking status: Former Smoker    Packs/day: 0.10    Years: 2.00    Pack years: 0.20    Types: Cigarettes    Start date: 11/16/1959    Last attempt to quit: 11/15/1960    Years since quitting: 57.5  . Smokeless tobacco: Never Used  Substance and Sexual Activity  . Alcohol use: No    Alcohol/week: 0.0 oz  . Drug use: No  . Sexual activity: Not on file    Comment: menarche age 40, fist live birth 34, P 3, hysterectomy age 49, no HRT, BCP 2 yrs  Lifestyle  . Physical activity:    Days per week: Not on file    Minutes per session: Not on file  . Stress: Not on file  Relationships  . Social connections:    Talks on phone: Not on file    Gets together: Not on file    Attends religious service: Not on file    Active  member of club or organization: Not on file    Attends meetings of clubs  or organizations: Not on file    Relationship status: Not on file  Other Topics Concern  . Not on file  Social History Narrative   Occupation:  Retired Radiation protection practitioner    Married with 3 grown children      Never Smoked     Alcohol use-no         Allergies: No Known Allergies  Metabolic Disorder Labs: Lab Results  Component Value Date   HGBA1C 6.0 07/14/2017   MPG 126 (H) 08/27/2013   MPG 117 (H) 06/22/2013   Lab Results  Component Value Date   PROLACTIN 3.9 07/22/2009   Lab Results  Component Value Date   CHOL 174 07/14/2017   TRIG 141.0 07/14/2017   HDL 41.00 07/14/2017   CHOLHDL 4 07/14/2017   VLDL 28.2 07/14/2017   LDLCALC 105 (H) 07/14/2017   LDLCALC 131 (H) 04/30/2015   Lab Results  Component Value Date   TSH 0.75 07/14/2017   TSH 3.29 08/16/2016    Therapeutic Level Labs: No results found for: LITHIUM No results found for: VALPROATE No components found for:  CBMZ  Current Medications: Current Outpatient Medications  Medication Sig Dispense Refill  . Calcium Carb-Cholecalciferol (CALCIUM-VITAMIN D) 500-400 MG-UNIT TABS Take by mouth 2 (two) times daily.     . cholecalciferol (VITAMIN D) 1000 units tablet Take 2,000 Units by mouth daily.    Marland Kitchen co-enzyme Q-10 30 MG capsule Take 300 mg by mouth daily.     . cyclobenzaprine (FLEXERIL) 10 MG tablet Take 1 tablet (10 mg total) by mouth 3 (three) times daily as needed for muscle spasms. 90 tablet 0  . diclofenac sodium (VOLTAREN) 1 % GEL as needed.     Marland Kitchen esomeprazole (NEXIUM) 40 MG capsule Take 1 capsule by mouth  daily 90 capsule 1  . levothyroxine (SYNTHROID, LEVOTHROID) 100 MCG tablet Take 1 tablet (100 mcg total) daily before breakfast by mouth. Take one pill in middle of night 90 tablet 3  . LUTEIN PO Take by mouth.    . Melatonin 3 MG CAPS Take by mouth at bedtime.    . meloxicam (MOBIC) 7.5 MG tablet Take 1 tablet (7.5 mg total) daily by  mouth. 90 tablet 1  . methocarbamol (ROBAXIN) 500 MG tablet Take 500 mg by mouth every 6 (six) hours as needed for muscle spasms.    Marland Kitchen omega-3 acid ethyl esters (LOVAZA) 1 g capsule Take 1 capsule (1 g total) 2 (two) times daily by mouth. 180 capsule 3  . oxyCODONE-acetaminophen (PERCOCET/ROXICET) 5-325 MG tablet Take 1 tablet by mouth every 8 (eight) hours as needed for severe pain. 8 tablet 0  . polyethylene glycol (MIRALAX / GLYCOLAX) packet Take 17 g by mouth daily.    . pregabalin (LYRICA) 75 MG capsule Take 1 capsule (75 mg total) by mouth 2 (two) times daily. 180 capsule 0  . saccharomyces boulardii (FLORASTOR) 250 MG capsule Take 250 mg by mouth daily.    . simvastatin (ZOCOR) 10 MG tablet TAKE 1 TABLET BY MOUTH AT BEDTIME 90 tablet 1  . tamoxifen (NOLVADEX) 20 MG tablet Take 1 tablet (20 mg total) daily by mouth. 90 tablet 4  . traMADol (ULTRAM) 50 MG tablet Take 1-2 tablets (50-100 mg total) by mouth every 6 (six) hours as needed (mild pain). 80 tablet 1  . TURMERIC PO Take 1,000 mg by mouth daily.    Marland Kitchen venlafaxine XR (EFFEXOR-XR) 150 MG 24 hr capsule Take 1 capsule (150 mg total) by mouth daily. [place]  capsule 1   No current facility-administered medications for this visit.      Musculoskeletal: Strength & Muscle Tone: within normal limits Gait & Station: normal Patient leans: N/A  Psychiatric Specialty Exam: Review of Systems  Psychiatric/Behavioral: Negative for depression, hallucinations, memory loss, substance abuse and suicidal ideas. The patient has insomnia. The patient is not nervous/anxious.   All other systems reviewed and are negative.   Blood pressure 127/82, pulse 76, height 4\' 11"  (1.499 m), weight 158 lb (71.7 kg), SpO2 96 %.Body mass index is 31.91 kg/m.  General Appearance: Fairly Groomed  Eye Contact:  Good  Speech:  Clear and Coherent  Volume:  Normal  Mood:  "good"  Affect:  Appropriate and Congruent  Thought Process:  Coherent  Orientation:  Full (Time,  Place, and Person)  Thought Content: Logical   Suicidal Thoughts:  No  Homicidal Thoughts:  No  Memory:  Immediate;   Good  Judgement:  Good  Insight:  Good  Psychomotor Activity:  Normal  Concentration:  Concentration: Good and Attention Span: Good  Recall:  Good  Fund of Knowledge: Good  Language: Good  Akathisia:  No  Handed:  Right  AIMS (if indicated): not done  Assets:  Communication Skills Desire for Improvement  ADL's:  Intact  Cognition: WNL  Sleep:  Fair   Screenings: PHQ2-9     Office Visit from 07/14/2017 in [facility] at [facility] from 11/28/2015 in [facility] at [facility] from 10/14/2014 in [facility] at [facility] from 10/01/2013 in [facility] at [facility] Total Score  0  0  1  0  PHQ-9 Total Score  5  -  -  -       Assessment and Plan:  Jenai Scaletta is a 77 y.o. year old female with a history of depression, anxiety, breat cancer s/p mastectomy, who presents for follow up appointment for MDD (major depressive disorder), recurrent episode, mild ([place])  # MDD, mild recurrent without psychotic features Exam is notable for euthymic, reactive affect and patient denies significant neurovegetative symptoms since switching from citalopram to Effexor.  Will continue current dose to target neurovegetative symptoms.  Noted that this medication was chosen given it has less interaction with tamoxifen.discussed risk of hypertension.  Discussed behavioral activation.  Will plan will plan to continue Effexor for at least nine months to one year (patient has a history of depression, although she has not tried psychotropics in the past).   # Insomnia Patient reports insomnia, and has discontinued CPAP use. She is advised to discuss with her provider. Discussed sleep hygiene.  Plan I have reviewed and updated plans as below 1. Continue Effexor 150 mg daily  2. Return to clinic in four months for 15  mins  The patient demonstrates the following risk factors for suicide: Chronic risk factors for suicide include:psychiatric disorder ofdepression. Acute risk factorsfor suicide include: N/A. Protective factorsfor this patient include: positive social support, coping skills and hope for the future. Considering these factors, the overall suicide risk at this point appears to below. Patientisappropriate for outpatient follow up.  Norman Clay, MD 05/15/2018, 10:56 AM

## 2018-05-15 ENCOUNTER — Encounter (HOSPITAL_COMMUNITY): Payer: Self-pay | Admitting: Psychiatry

## 2018-05-15 ENCOUNTER — Ambulatory Visit (INDEPENDENT_AMBULATORY_CARE_PROVIDER_SITE_OTHER): Payer: Medicare HMO | Admitting: Psychiatry

## 2018-05-15 VITALS — BP 127/82 | HR 76 | Ht 59.0 in | Wt 158.0 lb

## 2018-05-15 DIAGNOSIS — F330 Major depressive disorder, recurrent, mild: Secondary | ICD-10-CM

## 2018-05-15 DIAGNOSIS — R69 Illness, unspecified: Secondary | ICD-10-CM | POA: Diagnosis not present

## 2018-05-15 MED ORDER — VENLAFAXINE HCL ER 150 MG PO CP24: 150.0000 mg | ORAL_CAPSULE | Freq: Every day | ORAL | 1 refills | Status: DC

## 2018-05-15 NOTE — Patient Instructions (Signed)
1. Continue Effexor 150 mg daily  2. Return to clinic in three months for 15 mins

## 2018-05-16 DIAGNOSIS — L814 Other melanin hyperpigmentation: Secondary | ICD-10-CM | POA: Diagnosis not present

## 2018-05-16 DIAGNOSIS — D1801 Hemangioma of skin and subcutaneous tissue: Secondary | ICD-10-CM | POA: Diagnosis not present

## 2018-05-16 DIAGNOSIS — D225 Melanocytic nevi of trunk: Secondary | ICD-10-CM | POA: Diagnosis not present

## 2018-05-16 DIAGNOSIS — L821 Other seborrheic keratosis: Secondary | ICD-10-CM | POA: Diagnosis not present

## 2018-05-28 ENCOUNTER — Encounter: Payer: Self-pay | Admitting: Oncology

## 2018-05-29 DIAGNOSIS — R69 Illness, unspecified: Secondary | ICD-10-CM | POA: Diagnosis not present

## 2018-05-29 DIAGNOSIS — C50911 Malignant neoplasm of unspecified site of right female breast: Secondary | ICD-10-CM | POA: Diagnosis not present

## 2018-05-29 DIAGNOSIS — Z6832 Body mass index (BMI) 32.0-32.9, adult: Secondary | ICD-10-CM | POA: Diagnosis not present

## 2018-05-29 DIAGNOSIS — M150 Primary generalized (osteo)arthritis: Secondary | ICD-10-CM | POA: Diagnosis not present

## 2018-05-29 DIAGNOSIS — K219 Gastro-esophageal reflux disease without esophagitis: Secondary | ICD-10-CM | POA: Diagnosis not present

## 2018-05-29 DIAGNOSIS — E785 Hyperlipidemia, unspecified: Secondary | ICD-10-CM | POA: Diagnosis not present

## 2018-05-29 DIAGNOSIS — E039 Hypothyroidism, unspecified: Secondary | ICD-10-CM | POA: Diagnosis not present

## 2018-05-29 DIAGNOSIS — C50912 Malignant neoplasm of unspecified site of left female breast: Secondary | ICD-10-CM | POA: Diagnosis not present

## 2018-05-29 DIAGNOSIS — E559 Vitamin D deficiency, unspecified: Secondary | ICD-10-CM | POA: Diagnosis not present

## 2018-05-29 DIAGNOSIS — E6609 Other obesity due to excess calories: Secondary | ICD-10-CM | POA: Diagnosis not present

## 2018-05-30 ENCOUNTER — Ambulatory Visit: Payer: Self-pay | Admitting: Adult Health

## 2018-05-30 ENCOUNTER — Other Ambulatory Visit: Payer: Self-pay | Admitting: *Deleted

## 2018-05-30 DIAGNOSIS — C50511 Malignant neoplasm of lower-outer quadrant of right female breast: Secondary | ICD-10-CM

## 2018-05-30 DIAGNOSIS — Z170 Estrogen receptor positive status [ER+]: Principal | ICD-10-CM

## 2018-05-31 ENCOUNTER — Ambulatory Visit: Payer: Medicare HMO | Attending: Oncology | Admitting: Rehabilitation

## 2018-05-31 ENCOUNTER — Encounter: Payer: Self-pay | Admitting: Rehabilitation

## 2018-05-31 ENCOUNTER — Other Ambulatory Visit: Payer: Self-pay

## 2018-05-31 ENCOUNTER — Ambulatory Visit (INDEPENDENT_AMBULATORY_CARE_PROVIDER_SITE_OTHER): Payer: Medicare HMO | Admitting: Adult Health

## 2018-05-31 ENCOUNTER — Encounter: Payer: Self-pay | Admitting: Adult Health

## 2018-05-31 VITALS — BP 130/70 | Temp 98.1°F | Wt 160.0 lb

## 2018-05-31 DIAGNOSIS — I972 Postmastectomy lymphedema syndrome: Secondary | ICD-10-CM

## 2018-05-31 DIAGNOSIS — R293 Abnormal posture: Secondary | ICD-10-CM | POA: Insufficient documentation

## 2018-05-31 DIAGNOSIS — R599 Enlarged lymph nodes, unspecified: Secondary | ICD-10-CM

## 2018-05-31 DIAGNOSIS — M25511 Pain in right shoulder: Secondary | ICD-10-CM | POA: Diagnosis not present

## 2018-05-31 NOTE — Progress Notes (Signed)
Subjective:    [name], female    DOB: 1941/08/24, 77 y.o.   MRN: [id number]  HPI  77 year old female who  has a past medical history of Anxiety, Arthritis, Breast cancer ([place]) (08/08/13), Cataract, Chronic insomnia, Cluster headaches, Depression, Fibromyalgia, GERD (gastroesophageal reflux disease), H/O hiatal hernia, History of colonic polyps, History of transfusion (08/30/2013), radiation therapy (10/29/13- 12/14/13), Hypothyroidism, Irritable bowel syndrome, Kidney stone, Lymphedema, Macular degeneration, Osteopenia, Other abnormal glucose, Other and unspecified hyperlipidemia, Pain in joint, shoulder region, Pneumonia (0865,7846), Sleep apnea, and Stress incontinence, female.  She presents to the office today for concern of enlarged lymph node on right side of neck. This has been present for multiple weeks She denies any pain, difficulty swallowing, or feeling acutely ill. She does have a history of breast cancer and is concerned   Review of Systems See HPI   Past Medical History:  Diagnosis Date  . Anxiety    PHOBIAS  . Arthritis   . Breast cancer ([place]) 08/08/13   right LOQ  . Cataract   . Chronic insomnia   . Cluster headaches    history of migraines / NONE FOR SEVERAL YRS  . Depression   . Fibromyalgia   . GERD (gastroesophageal reflux disease)   . H/O hiatal hernia   . History of colonic polyps   . History of transfusion 08/30/2013  . Hx of radiation therapy 10/29/13- 12/14/13   right chest wall 5040 cGy 28 sessions, right supraclavicular/axillary region 5040 cGy 28 sessions, right chest wall boost 1000 cGy 5 sessions  . Hypothyroidism   . Irritable bowel syndrome   . Kidney stone   . Lymphedema    RT ARM - WEARS SLEEVE  . Macular degeneration    hole/right eye  . Osteopenia   . Other abnormal glucose   . Other and unspecified hyperlipidemia   . Pain in joint, shoulder region   . Pneumonia 9629,5284  . Sleep apnea    USES C-PAP  . Stress  incontinence, female     Social History   Socioeconomic History  . Marital status: Married    Spouse name: Not on file  . Number of children: 3  . Years of education: Not on file  . Highest education level: Not on file  Occupational History  . Occupation: retired Medical laboratory scientific officer  . Financial resource strain: Not on file  . Food insecurity:    Worry: Not on file    Inability: Not on file  . Transportation needs:    Medical: Not on file    Non-medical: Not on file  Tobacco Use  . Smoking status: Former Smoker    Packs/day: 0.10    Years: 2.00    Pack years: 0.20    Types: Cigarettes    Start date: 11/16/1959    Last attempt to quit: 11/15/1960    Years since quitting: 57.5  . Smokeless tobacco: Never Used  Substance and Sexual Activity  . Alcohol use: No    Alcohol/week: 0.0 oz  . Drug use: No  . Sexual activity: Not on file    Comment: menarche age 53, fist live birth 17, P 3, hysterectomy age 48, no HRT, BCP 2 yrs  Lifestyle  . Physical activity:    Days per week: Not on file    Minutes per session: Not on file  . Stress: Not on file  Relationships  . Social connections:    Talks on phone: Not on file  Gets together: Not on file    Attends religious service: Not on file    Active member of club or organization: Not on file    Attends meetings of clubs or organizations: Not on file    Relationship status: Not on file  . Intimate partner violence:    Fear of current or ex partner: Not on file    Emotionally abused: Not on file    Physically abused: Not on file    Forced sexual activity: Not on file  Other Topics Concern  . 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 ABDOMINAL HYSTERECTOMY    . APPENDECTOMY    . BILATERAL TOTAL MASTECTOMY WITH AXILLARY LYMPH NODE DISSECTION  08/30/2013    Dr Barry Dienes  . BREAST CYST ASPIRATION     9 cysts  . CATARACT EXTRACTION, BILATERAL  2005/2007  . CHOLECYSTECTOMY    . COLONOSCOPY    . EVACUATION BREAST HEMATOMA Left 08/31/2013   Procedure: EVACUATION HEMATOMA BREAST;  Surgeon: Stark Klein, MD;  Location: [facility];  Service: General;  Laterality: Left;  . EYE SURGERY     to repair macular hole  . FOOT ARTHROPLASTY     lt   . GANGLION CYST EXCISION     rt foot  . HEMORRHOID SURGERY     03/1993  . JOINT REPLACEMENT  03/15/11   left knee replacement  . KNEE ARTHROSCOPY     /partial knee 2016/left knee 2012  . MASS EXCISION  11/04/2011   Procedure: EXCISION MASS;  Surgeon: Cammie Sickle., MD;  Location: [place];  Service: Orthopedics;  Laterality: Right;  excisional biopsy right ulna mass  . MASTECTOMY W/ SENTINEL NODE BIOPSY Right 08/30/2013   Procedure: RIGHT  AXILLARY SENTINEL LYMPH NODE BIOPSY; Right Axillary Node Disection;  Surgeon: Stark Klein, MD;  Location: [facility];  Service: General;  Laterality: Right;  Right side nuc med 7:00   . PARTIAL KNEE ARTHROPLASTY Right 11/03/2015   Procedure: RIGHT KNEE MEDIAL UNICOMPARTMENTAL ARTHROPLASTY ;  Surgeon: Gaynelle Arabian, MD;  Location[facility];  Service: Orthopedics;  Laterality: Right;  . SIMPLE MASTECTOMY WITH AXILLARY SENTINEL NODE BIOPSY Left 08/30/2013   Procedure: Bilateral Breast Mastectomy ;  Surgeon: Stark Klein, MD;  Location: [facility];  Service: General;  Laterality: Left;  . skin tags removed     breast, panty line, neckline  . TOE SURGERY     preventative crossover toe surg/right foot  . TOE SURGERY  2009   left foot/screw  in 2nd toe  . TONSILLECTOMY    . UPPER GASTROINTESTINAL ENDOSCOPY      Family History  Problem Relation Age of Onset  . Stroke Mother        died age 89  . Diabetes Mother   . Breast cancer Mother 66  . Breast cancer Sister 49  . Breast cancer Paternal Aunt 59  . Diabetes Maternal Grandfather   . Breast cancer Paternal  Grandmother 50  . Breast cancer Paternal Aunt        dx in her 63s  . Cancer Maternal Grandmother        intra-abdominal cancer  . Brain cancer Maternal Uncle 8  . Brain cancer Cousin 50  maternal cousin  . Brain cancer Cousin 20       paternal cousin  . Colon cancer Neg Hx     No Known Allergies  Current Outpatient Medications on File Prior to Visit  Medication Sig Dispense Refill  . Calcium Carb-Cholecalciferol (CALCIUM-VITAMIN D) 500-400 MG-UNIT TABS Take by mouth 2 (two) times daily.     . cholecalciferol (VITAMIN D) 1000 units tablet Take 2,000 Units by mouth daily.    Marland Kitchen co-enzyme Q-10 30 MG capsule Take 300 mg by mouth daily.     . cyclobenzaprine (FLEXERIL) 10 MG tablet Take 1 tablet (10 mg total) by mouth 3 (three) times daily as needed for muscle spasms. 90 tablet 0  . diclofenac sodium (VOLTAREN) 1 % GEL as needed.     Marland Kitchen esomeprazole (NEXIUM) 40 MG capsule Take 1 capsule by mouth  daily 90 capsule 1  . levothyroxine (SYNTHROID, LEVOTHROID) 100 MCG tablet Take 1 tablet (100 mcg total) daily before breakfast by mouth. Take one pill in middle of night 90 tablet 3  . LUTEIN PO Take by mouth.    . Melatonin 3 MG CAPS Take by mouth at bedtime.    . meloxicam (MOBIC) 7.5 MG tablet Take 1 tablet (7.5 mg total) daily by mouth. 90 tablet 1  . methocarbamol (ROBAXIN) 500 MG tablet Take 500 mg by mouth every 6 (six) hours as needed for muscle spasms.    Marland Kitchen omega-3 acid ethyl esters (LOVAZA) 1 g capsule Take 1 capsule (1 g total) 2 (two) times daily by mouth. 180 capsule 3  . oxyCODONE-acetaminophen (PERCOCET/ROXICET) 5-325 MG tablet Take 1 tablet by mouth every 8 (eight) hours as needed for severe pain. 8 tablet 0  . polyethylene glycol (MIRALAX / GLYCOLAX) packet Take 17 g by mouth daily.    . pregabalin (LYRICA) 75 MG capsule Take 1 capsule (75 mg total) by mouth 2 (two) times daily. 180 capsule 0  . saccharomyces boulardii (FLORASTOR) 250 MG capsule Take 250 mg by mouth daily.      . simvastatin (ZOCOR) 10 MG tablet TAKE 1 TABLET BY MOUTH AT BEDTIME 90 tablet 1  . tamoxifen (NOLVADEX) 20 MG tablet Take 1 tablet (20 mg total) daily by mouth. 90 tablet 4  . traMADol (ULTRAM) 50 MG tablet Take 1-2 tablets (50-100 mg total) by mouth every 6 (six) hours as needed (mild pain). 80 tablet 1  . TURMERIC PO Take 1,000 mg by mouth daily.    Marland Kitchen venlafaxine XR (EFFEXOR-XR) 150 MG 24 hr capsule Take 1 capsule (150 mg total) by mouth daily. 90 capsule 1   No current facility-administered medications on file prior to visit.     BP 130/70   Temp 98.1 F (36.7 C) (Oral)   Wt 160 lb (72.6 kg)   BMI 32.32 kg/m       Objective:   Physical Exam  Constitutional: She appears well-developed and well-nourished. No distress.  HENT:  Mouth/Throat: Uvula is midline, oropharynx is clear and moist and mucous membranes are normal.  Neck: Trachea normal, normal range of motion and full passive range of motion without pain. No thyroid mass present.  Lymphadenopathy:       Head (right side): Submandibular adenopathy present.       Head (left side): No submandibular adenopathy present.  Slightly enlarged but not painful submandibular lymph node on right    Skin: She is not diaphoretic.  Nursing note and vitals reviewed.     Assessment & Plan:  1.  Enlarged lymph node - US Soft Tissue Head/Neck; Future - Will get [name] due to time frame and history of cancer   [name], NP

## 2018-05-31 NOTE — Therapy (Signed)
[place], [place], [address] Phone: [phone]   Fax:  [phone]  Physical Therapy Evaluation  Patient Details  Name: Jean Nash MRN: [id number] Date of Birth: 10-20-1941 Referring Provider: Dr. Jana Hakim   Encounter Date: 05/31/2018  PT End of Session - 05/31/18 1643    Visit Number  1    Number of Visits  8    Date for PT Re-Evaluation  06/28/18    PT Start Time  1440    PT Stop Time  1523    PT Time Calculation (min)  43 min    Activity Tolerance  Patient tolerated treatment well    Behavior During Therapy  [facility] for tasks assessed/performed       Past Medical History:  Diagnosis Date  . Anxiety    PHOBIAS  . Arthritis   . Breast cancer ([place]) 08/08/13   right LOQ  . Cataract   . Chronic insomnia   . Cluster headaches    history of migraines / NONE FOR SEVERAL YRS  . Depression   . Fibromyalgia   . GERD (gastroesophageal reflux disease)   . H/O hiatal hernia   . History of colonic polyps   . History of transfusion 08/30/2013  . Hx of radiation therapy 10/29/13- 12/14/13   right chest wall 5040 cGy 28 sessions, right supraclavicular/axillary region 5040 cGy 28 sessions, right chest wall boost 1000 cGy 5 sessions  . Hypothyroidism   . Irritable bowel syndrome   . Kidney stone   . Lymphedema    RT ARM - WEARS SLEEVE  . Macular degeneration    hole/right eye  . Osteopenia   . Other abnormal glucose   . Other and unspecified hyperlipidemia   . Pain in joint, shoulder region   . Pneumonia 4132,4401  . Sleep apnea    USES C-PAP  . Stress incontinence, female     Past Surgical History:  Procedure Laterality Date  . ABDOMINAL HYSTERECTOMY    . APPENDECTOMY    . BILATERAL TOTAL MASTECTOMY WITH AXILLARY LYMPH NODE DISSECTION  08/30/2013   Dr Barry Dienes  . BREAST CYST ASPIRATION     9 cysts  . CATARACT EXTRACTION, BILATERAL  2005/2007  . CHOLECYSTECTOMY    . COLONOSCOPY    .  EVACUATION BREAST HEMATOMA Left 08/31/2013   Procedure: EVACUATION HEMATOMA BREAST;  Surgeon: Stark Klein, MD;  Location: [place];  Service: General;  Laterality: Left;  . EYE SURGERY     to repair macular hole  . FOOT ARTHROPLASTY     lt   . GANGLION CYST EXCISION     rt foot  . HEMORRHOID SURGERY     03/1993  . JOINT REPLACEMENT  03/15/11   left knee replacement  . KNEE ARTHROSCOPY     /partial knee 2016/left knee 2012  . MASS EXCISION  11/04/2011   Procedure: EXCISION MASS;  Surgeon: Cammie Sickle., MD;  Location: [place] [place];  Service: Orthopedics;  Laterality: Right;  excisional biopsy right ulna mass  . MASTECTOMY W/ SENTINEL NODE BIOPSY Right 08/30/2013   Procedure: RIGHT  AXILLARY SENTINEL LYMPH NODE BIOPSY; Right Axillary Node Disection;  Surgeon: Stark Klein, MD;  Location: [place];  Service: General;  Laterality: Right;  Right side nuc med 7:00   . PARTIAL KNEE ARTHROPLASTY Right 11/03/2015   Procedure: RIGHT KNEE MEDIAL UNICOMPARTMENTAL ARTHROPLASTY ;  Surgeon: Gaynelle Arabian, MD;  [facility];  Service: Orthopedics;  Laterality: Right;  .  SIMPLE MASTECTOMY WITH AXILLARY SENTINEL NODE BIOPSY Left 08/30/2013   Procedure: Bilateral Breast Mastectomy ;  Surgeon: Stark Klein, MD;  Location: [place];  Service: General;  Laterality: Left;  . skin tags removed     breast, panty line, neckline  . TOE SURGERY     preventative crossover toe surg/right foot  . TOE SURGERY  2009   left foot/screw  in 2nd toe  . TONSILLECTOMY    . UPPER GASTROINTESTINAL ENDOSCOPY      There were no vitals filed for this visit.   Subjective Assessment - 05/31/18 1444    Subjective  Pt presents with complaints increased tightness of the sleeves in the tshirt x 2 months.  Also reporting some increased size visually.  I'm starting to worry about it now.  Increased Rt breast pain lately and lymph node inflammation on the R side which she will be getting an [name] on after a visit  regarding this earlier today.  Has lost 25 pounds since last visit    Patient is accompained by:  Family member    Pertinent History  She had a bilateral mastectomy with a right axillary node dissection removing 16 lymph nodes and 9 of those were positive.  Currently taking tamoxifen, Bil TKR    Patient Stated Goals  what to do for the swelling    Currently in Pain?  No/denies         [facility] PT Assessment - 05/31/18 0001      Assessment   Medical Diagnosis  Rt breast cancer with bil mastectomy     Referring Provider  Dr. Jana Hakim    Onset Date/Surgical Date  08/30/13    Hand Dominance  Right    Prior Therapy  yes      Precautions   Precaution Comments  cancer      Restrictions   Weight Bearing Restrictions  No      Balance Screen   Has the patient fallen in the past 6 months  No    Has the patient had a decrease in activity level because of a fear of falling?   No    Is the patient reluctant to leave their home because of a fear of falling?   No      Home Film/video editor residence    Living Arrangements  Spouse/significant other    Home Access  Ramped entrance    [place]  None      Prior Function   Level of [place]  Retired    Leisure  swimming own pool      Cognition   Overall Cognitive Status  Within Functional Limits for tasks assessed      Observation/Other Assessments   Observations  upper arm larger in size;       ROM / Strength   AROM / PROM / Strength  AROM;Strength      AROM   Overall AROM Comments  WNL with pull in the upper Rt pectoralis with end range flexion and abduction    AROM Assessment Site  Shoulder      Strength   Overall Strength Comments  WNL bilaterally     Strength Assessment Site  Shoulder    Right/Left Shoulder  Right;Left        LYMPHEDEMA/ONCOLOGY QUESTIONNAIRE - 05/31/18 1455      Type   Cancer Type  right breast  cancer      Surgeries    Mastectomy Date  08/30/13    Axillary Lymph Node Dissection Date  08/30/13    Number Lymph Nodes Removed  16 9 pos      Date Lymphedema/Swelling Started   Date  03/15/18      Treatment   Active Chemotherapy Treatment  No    Past Chemotherapy Treatment  No    Active Radiation Treatment  No    Past Radiation Treatment  Yes    Current Hormone Treatment  Yes    Date  04/16/15    Drug Name  Tamoxifen      What other symptoms do you have   Are you Having Heaviness or Tightness  Yes    Do you have infections  No      Lymphedema Assessments   Lymphedema Assessments  Upper extremities      Right Upper Extremity Lymphedema   15 cm Proximal to Olecranon Process  37 cm    10 cm Proximal to Olecranon Process  35.3 cm    Olecranon Process  26.4 cm    15 cm Proximal to Ulnar Styloid Process  24 cm    10 cm Proximal to Ulnar Styloid Process  21.5 cm    Just Proximal to Ulnar Styloid Process  17 cm    Across Hand at [employer]  19 cm    At [place] of 2nd Digit  -- ring inthe way and stuck    At [facility] of Thumb  6.4 cm      Left Upper Extremity Lymphedema   15 cm Proximal to Olecranon Process  35 cm    10 cm Proximal to Olecranon Process  33 cm    Olecranon Process  25.3 cm    15 cm Proximal to Ulnar Styloid Process  23.8 cm    10 cm Proximal to Ulnar Styloid Process  21.7 cm    Just Proximal to Ulnar Styloid Process  16.3 cm    Across Hand at [employer]  18.5 cm    At [place] of 2nd Digit  6.2 cm             Outpatient Rehab from [facility]/31/2018 [facility]  Lymphedema Life Impact Scale Total Score  13.24 %      Objective measurements completed on examination: See above findings.              PT Education - 05/31/18 1643    Education Details  Lymphedema garment / treatment options    Person(s) Educated  Patient;Spouse    Methods  Explanation    Comprehension  Verbalized understanding          PT Long Term Goals -  05/31/18 1649      PT LONG TERM GOAL #1   Title  Pt will obtain appropriate compression garment for the Rt UE and will be knowledgeable about use    Time  4    Period  Weeks    Status  New    Target Date  06/28/18      PT LONG TERM GOAL #2   Title  Pt will have painfree Rt shoulder flexion and abduction    Time  4    Period  Weeks    Status  New    Target Date  06/28/18      PT LONG TERM GOAL #3   Title  Pt will decrease 15cm and 10cm upper  arm measurements to 35 and 33    Baseline  37, 35    Time  4    Period  Weeks    Status  New    Target Date  06/28/18      PT LONG TERM GOAL #4   Title  Pt will be ind with MLD for continued care    Time  4    Period  Weeks    Status  New    Target Date  06/28/18             Plan - 05/31/18 1644    Clinical Impression Statement  Nyaja returns to PT with reports of new onset swelling of the upper Rt arm x 2 months.  She reports that she has an old compression garment from before but is not sure where it is.  ROM of the Rt shoulder is WNL at this time but with some pectoralis region pull with flexion and abduction, strength is also WNL.  She has a 2cm difference in the upper extremity compared to the Lt but measures are the same as previous therapy due to significant weight loss.  Pt reports she will no do compression bandaging at this time but will try a velcro garment after discussing this.  She is also agreeable to MLD for a few session until independent    Clinical Decision Making  Moderate    Rehab Potential  Excellent    Clinical Impairments Affecting Rehab Potential  will not do bandaging     PT Frequency  2x / week    PT Duration  3 weeks    PT Treatment/Interventions  ADLs/Self Care Home Management    PT Next Visit Plan  begin Rt UE MLD/teach as able.  ROM into flexion and abduction passive and AAROM, script back?       Patient will benefit from skilled therapeutic intervention in order to improve the following deficits  and impairments:  Pain, Increased edema  Visit Diagnosis: Postmastectomy lymphedema  Acute pain of right shoulder     Problem List Patient Active Problem List   Diagnosis Date Noted  . MDD (major depressive disorder), recurrent episode, mild ([place]) 12/22/2017  . Osteoporosis 07/14/2017  . OA (osteoarthritis) of knee 11/03/2015  . Osteopenia 08/08/2015  . Obstructive sleep apnea 06/25/2015  . Headache 02/03/2015  . Atypical chest pain 07/15/2014  . Arthralgia 02/22/2014  . Psoriasis 02/22/2014  . Malignant neoplasm of lower-outer quadrant of right breast of female, estrogen receptor positive ([place]) 08/09/2013  . Neck pain 06/22/2013  . Left knee pain 11/29/2012  . Reactive depression (situational) 04/26/2012  . Right wrist pain 02/02/2012  . Right foot pain 10/04/2011  . Nevus 06/05/2011  . Status post total knee replacement 04/06/2011  . Overactive bladder 03/14/2011  . KNEE PAIN, LEFT 08/31/2010  . HEARING LOSS 08/17/2010  . HIRSUTISM 06/16/2009  . CYST, IDIOPATHIC 05/20/2008  . IRRITABLE BOWEL SYNDROME 04/15/2008  . Allergic asthma, mild intermittent, uncomplicated 62/[id number]/7654  . GERD 03/19/2008  . COLONIC POLYPS, HX OF 03/19/2008  . NEPHROLITHIASIS, HX OF 03/19/2008  . Hypothyroidism 03/18/2008  . Hyperlipidemia 03/18/2008  . INSOMNIA, CHRONIC 03/18/2008  . FIBROMYALGIA 03/18/2008  . Prediabetes 03/18/2008    Shan Levans, PT 05/31/2018, 5:04 PM  [place], [place], [address] Phone: [phone]   Fax:  [phone]  Name: Jean Nash MRN: [id number] Date of Birth: 1941/03/13

## 2018-06-01 ENCOUNTER — Ambulatory Visit (INDEPENDENT_AMBULATORY_CARE_PROVIDER_SITE_OTHER): Payer: Medicare HMO | Admitting: Internal Medicine

## 2018-06-01 ENCOUNTER — Encounter: Payer: Self-pay | Admitting: Internal Medicine

## 2018-06-01 ENCOUNTER — Encounter: Payer: Self-pay | Admitting: Adult Health

## 2018-06-01 DIAGNOSIS — G4733 Obstructive sleep apnea (adult) (pediatric): Secondary | ICD-10-CM

## 2018-06-01 DIAGNOSIS — J4520 Mild intermittent asthma, uncomplicated: Secondary | ICD-10-CM

## 2018-06-01 MED ORDER — UMECLIDINIUM-VILANTEROL 62.5-25 MCG/INH IN AEPB: 1.0000 | INHALATION_SPRAY | Freq: Every day | RESPIRATORY_TRACT | 6 refills | Status: DC

## 2018-06-01 MED ORDER — UMECLIDINIUM-VILANTEROL 62.5-25 MCG/INH IN AEPB: 1.0000 | INHALATION_SPRAY | Freq: Every day | RESPIRATORY_TRACT | 0 refills | Status: DC

## 2018-06-01 NOTE — Assessment & Plan Note (Signed)
She has lost significant weight, husband verifies she is snoring much less and she feels she is sleeping better without CPAP.  She is intending to try to lose more weight. Plan-we discussed the importance of treating significant sleep apnea.  We will reconsider this issue on return in 6 months.

## 2018-06-01 NOTE — Progress Notes (Signed)
HPI female nonsmoker followed for OSA, insomnia, complicated by hypothyroid, asthma, GERD, psoriasis, CA L breast/ biMastectomy Unattended Home Sleep Test 03/06/2015- severe obstructive sleep apnea/hypopnea syndrome, AHI 44.9 per hour with desaturation to 74% and mean saturation only 89% on room air. Body weight 190 pounds   [place] 4/24  -----------------------------------------------------------------------------------  05/30/17- 77 year old female nonsmoker followed for OSA, insomnia, complicated by hypothyroid, asthma, GERD, psoriasis, CA L breast CPAP auto 8-15/[facility]            Husband here FOLLOWS FOR: Pt states she is wearing her CPAP at least 5 nights per week. Pt denies any issues with machine, mask or pressure.  Not a fan of CPAP but tries to use it for 4 hours. Dislikes her nasal mask is too bulky. Download 80% 4 hour compliance, AHI 1.6/hour. Never sleeps long intervals anyway. Has previously tried Silenor and temazepam. Complains of persistent dry cough for almost a month. Not much aware of postnasal drainage or reflux, no wheezing. Swims in pool for an hour a day.  06/01/2018- 77 year old female nonsmoker followed for OSA, insomnia, complicated by hypothyroid, asthma, GERD, psoriasis, CA L breast CPAP auto 8-15/[facility]  -----Hasnt been wearing her cpap because she feels she has been getting more sleep without it, based on her FitBit results On aggressive keto diet and increased exercise supervised by her husband she is lost 28 pounds.  Husband says she snores very much less.  She stopped CPAP a month ago saying that her Fitbit documents better sleep since she stopped using CPAP. Additional problem: Persistent dry cough has gotten more obvious in the past month or 2.  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 Additional problem: Pending ultrasound for right anterior cervical node which was acutely painful recently, improved. CXR  02/26/2018- Minimal atelectasis in the left base.  No other acute abnormalities. CTa chest / abd- Mediastinum/Nodes: Midline trachea. Coarse calcification right lobe of thyroid. No significantly enlarged lymph nodes. Esophagus within normal limits Lungs/Pleura: Mild right apical and right greater than left anterior subpleural fibrosis. No acute consolidation or effusion. No Pneumothorax.  ( Had XRT)  ROS-see HPI   + = positive Constitutional:    weight loss, night sweats, fevers, chills, fatigue, lassitude. HEENT:    headaches, difficulty swallowing, +tooth/dental problems, sore throat,       sneezing, itching, ear ache, nasal congestion, post nasal drip, snoring CV:    chest pain, orthopnea, PND, swelling in lower extremities, anasarca,                                                dizziness, palpitations Resp:   +shortness of breath with exertion or at rest.                productive cough,  + non-productive cough, coughing up of blood.              change in color of mucus.  wheezing.   Skin:    rash or lesions. GI:     +heartburn, indigestion, abdominal pain, nausea, vomiting, diarrhea,                 change in bowel habits, loss of appetite GU: dysuria, change in color of urine, no urgency or frequency.   flank pain. MS:   +joint pain, stiffness, decreased range  of motion, back pain. Neuro-     nothing unusual Psych:  change in mood or affect.  depression or anxiety.   memory loss.  OBJ- Physical Exam General- Alert, Oriented, Affect-appropriate, Distress- none acute, + obese Skin- rash-none, lesions- none, excoriation- none Lymphadenopathy- none Head- atraumatic            Eyes- Gross vision intact, PERRLA, conjunctivae and secretions clear            Ears- Hearing, canals-normal            Nose- Clear, no-Septal dev, mucus, polyps, erosion, perforation             Throat- Mallampati III-IV , mucosa clear , drainage- none, tonsils- atrophic Neck- flexible , trachea midline,  no stridor , thyroid nl, carotid no bruit Chest - symmetrical excursion , unlabored           Heart/CV- RRR , no murmur , no gallop  , no rub, nl s1 s2                           - JVD- none , edema- none, stasis changes- none, varices- none           Lung- clear to P&A/ no crackles,  wheeze- none, cough + dry , dullness-none, rub- none           Chest wall- + bilateral mastectomy Abd-  Br/ Gen/ Rectal- Not done, not indicated Extrem- cyanosis- none, clubbing, none, atrophy- none, strength- nl, + R arm elastic sleeve Neuro- grossly intact to observation

## 2018-06-01 NOTE — Assessment & Plan Note (Signed)
Dry cough.  She says this kind of cough improved significantly years ago when she was using Advair.  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  We will try treating with bronchodilator.  Consider need for PFT and high-resolution chest CT depending on response to sample Anoro.

## 2018-06-01 NOTE — Patient Instructions (Signed)
Sample Anoro inhaler    Inhale 1 puff, once daily    See if this helps the cough  We will see where you are in 6 months with the sleep apnea as you lose weight, and also with your cough.  Call sooner as needed.

## 2018-06-02 ENCOUNTER — Encounter: Payer: Self-pay | Admitting: Rehabilitation

## 2018-06-02 ENCOUNTER — Ambulatory Visit: Payer: Medicare HMO | Admitting: Rehabilitation

## 2018-06-02 DIAGNOSIS — I972 Postmastectomy lymphedema syndrome: Secondary | ICD-10-CM

## 2018-06-02 DIAGNOSIS — R293 Abnormal posture: Secondary | ICD-10-CM | POA: Diagnosis not present

## 2018-06-02 DIAGNOSIS — M25511 Pain in right shoulder: Secondary | ICD-10-CM | POA: Diagnosis not present

## 2018-06-02 NOTE — Therapy (Signed)
[place], [place], [address] Phone: [phone]   Fax:  [phone]  Physical Therapy Treatment  Patient Details  Name: Jean Nash MRN: [id number] Date of Birth: Jan 06, 1941 Referring Provider: Dr. Jana Hakim   Encounter Date: 06/02/2018  PT End of Session - 06/02/18 1230    Visit Number  2    Number of Visits  8    Date for PT Re-Evaluation  06/28/18    PT Start Time  1100    PT Stop Time  1138    PT Time Calculation (min)  38 min    Activity Tolerance  Patient tolerated treatment well    Behavior During Therapy  [facility] for tasks assessed/performed       Past Medical History:  Diagnosis Date  . Anxiety    PHOBIAS  . Arthritis   . Breast cancer ([place]) 08/08/13   right LOQ  . Cataract   . Chronic insomnia   . Cluster headaches    history of migraines / NONE FOR SEVERAL YRS  . Depression   . Fibromyalgia   . GERD (gastroesophageal reflux disease)   . H/O hiatal hernia   . History of colonic polyps   . History of transfusion 08/30/2013  . Hx of radiation therapy 10/29/13- 12/14/13   right chest wall 5040 cGy 28 sessions, right supraclavicular/axillary region 5040 cGy 28 sessions, right chest wall boost 1000 cGy 5 sessions  . Hypothyroidism   . Irritable bowel syndrome   . Kidney stone   . Lymphedema    RT ARM - WEARS SLEEVE  . Macular degeneration    hole/right eye  . Osteopenia   . Other abnormal glucose   . Other and unspecified hyperlipidemia   . Pain in joint, shoulder region   . Pneumonia 9390,3009  . Sleep apnea    USES C-PAP  . Stress incontinence, female     Past Surgical History:  Procedure Laterality Date  . ABDOMINAL HYSTERECTOMY    . APPENDECTOMY    . BILATERAL TOTAL MASTECTOMY WITH AXILLARY LYMPH NODE DISSECTION  08/30/2013   Dr Barry Dienes  . BREAST CYST ASPIRATION     9 cysts  . CATARACT EXTRACTION, BILATERAL  2005/2007  . CHOLECYSTECTOMY    . COLONOSCOPY    .  EVACUATION BREAST HEMATOMA Left 08/31/2013   Procedure: EVACUATION HEMATOMA BREAST;  Surgeon: Stark Klein, MD;  Location: [facility];  Service: General;  Laterality: Left;  . EYE SURGERY     to repair macular hole  . FOOT ARTHROPLASTY     lt   . GANGLION CYST EXCISION     rt foot  . HEMORRHOID SURGERY     03/1993  . JOINT REPLACEMENT  03/15/11   left knee replacement  . KNEE ARTHROSCOPY     /partial knee 2016/left knee 2012  . MASS EXCISION  11/04/2011   Procedure: EXCISION MASS;  Surgeon: Cammie Sickle., MD;  Location: [facility];  Service: Orthopedics;  Laterality: Right;  excisional biopsy right ulna mass  . MASTECTOMY W/ SENTINEL NODE BIOPSY Right 08/30/2013   Procedure: RIGHT  AXILLARY SENTINEL LYMPH NODE BIOPSY; Right Axillary Node Disection;  Surgeon: Stark Klein, MD;  Location: [place];  Service: General;  Laterality: Right;  Right side nuc med 7:00   . PARTIAL KNEE ARTHROPLASTY Right 11/03/2015   Procedure: RIGHT KNEE MEDIAL UNICOMPARTMENTAL ARTHROPLASTY ;  Surgeon: Gaynelle Arabian, MD;  Location: [facility];  Service: Orthopedics;  Laterality: Right;  .  SIMPLE MASTECTOMY WITH AXILLARY SENTINEL NODE BIOPSY Left 08/30/2013   Procedure: Bilateral Breast Mastectomy ;  Surgeon: Stark Klein, MD;  Location: [place];  Service: General;  Laterality: Left;  . skin tags removed     breast, panty line, neckline  . TOE SURGERY     preventative crossover toe surg/right foot  . TOE SURGERY  2009   left foot/screw  in 2nd toe  . TONSILLECTOMY    . UPPER GASTROINTESTINAL ENDOSCOPY      There were no vitals filed for this visit.  Subjective Assessment - 06/02/18 1104    Subjective  Doing well.  Swam today.  Found her compression garment and it seems to fit.      Pertinent History  She had a bilateral mastectomy with a right axillary node dissection removing 16 lymph nodes and 9 of those were positive.  Currently taking tamoxifen, Bil TKR    Currently in Pain?  No/denies                   Outpatient [facility]  Lymphedema Life Impact Scale Total Score  13.24 %           [facility] Adult PT Treatment/Exercise - 06/02/18 0001      Manual Therapy   Manual Therapy  Manual Lymphatic Drainage (MLD);Edema management;Passive ROM    Edema Management  Pts current compression seems to fit well being 77 years old but not worn frequently.  She is still interested in a velcro at this time    Manual Lymphatic Drainage (MLD)  short neck, deep abdominals, anterior interaxillary pathway, Rt axillo inguinal pathway, then working the Rt UE proximal to distal to the wrist and and then retracing all steps    Passive ROM  Rt shoulder into flexion and abduction with ER                   PT Long Term Goals - 05/31/18 1649      PT LONG TERM GOAL #1   Title  Pt will obtain appropriate compression garment for the Rt UE and will be knowledgeable about use    Time  4    Period  Weeks    Status  New    Target Date  06/28/18      PT LONG TERM GOAL #2   Title  Pt will have painfree Rt shoulder flexion and abduction    Time  4    Period  Weeks    Status  New    Target Date  06/28/18      PT LONG TERM GOAL #3   Title  Pt will decrease 15cm and 10cm upper arm measurements to 35 and 33    Baseline  37, 35    Time  4    Period  Weeks    Status  New    Target Date  06/28/18      PT LONG TERM GOAL #4   Title  Pt will be ind with MLD for continued care    Time  4    Period  Weeks    Status  New    Target Date  06/28/18            Plan - 06/02/18 1230    Clinical Impression Statement  Doing well.  tolerated MLD well.  Still against bandaging but wants to get velcro.  Script not back yet.      PT  Frequency  2x / week    PT Duration  3 weeks    PT Treatment/Interventions  ADLs/Self Care Home Management    PT Next Visit Plan  begin Rt UE MLD/teach as able.  ROM into flexion and abduction  passive and AAROM, script back?, measure for velcro garment?       Patient will benefit from skilled therapeutic intervention in order to improve the following deficits and impairments:     Visit Diagnosis: Postmastectomy lymphedema  Acute pain of right shoulder     Problem List Patient Active Problem List   Diagnosis Date Noted  . MDD (major depressive disorder), recurrent episode, mild ([place]) 12/22/2017  . Osteoporosis 07/14/2017  . OA (osteoarthritis) of knee 11/03/2015  . Osteopenia 08/08/2015  . Obstructive sleep apnea 06/25/2015  . Headache 02/03/2015  . Atypical chest pain 07/15/2014  . Arthralgia 02/22/2014  . Psoriasis 02/22/2014  . Malignant neoplasm of lower-outer quadrant of right breast of female, estrogen receptor positive ([place]) 08/09/2013  . Neck pain 06/22/2013  . Left knee pain 11/29/2012  . Reactive depression (situational) 04/26/2012  . Right wrist pain 02/02/2012  . Right foot pain 10/04/2011  . Nevus 06/05/2011  . Status post total knee replacement 04/06/2011  . Overactive bladder 03/14/2011  . KNEE PAIN, LEFT 08/31/2010  . HEARING LOSS 08/17/2010  . HIRSUTISM 06/16/2009  . CYST, IDIOPATHIC 05/20/2008  . IRRITABLE BOWEL SYNDROME 04/15/2008  . Allergic asthma, mild intermittent, uncomplicated 42/35/3614  . GERD 03/19/2008  . COLONIC POLYPS, HX OF 03/19/2008  . NEPHROLITHIASIS, HX OF 03/19/2008  . Hypothyroidism 03/18/2008  . Hyperlipidemia 03/18/2008  . INSOMNIA, CHRONIC 03/18/2008  . FIBROMYALGIA 03/18/2008  . Prediabetes 03/18/2008    Shan Levans, PT 06/02/2018, 12:31 PM  [place], [place], [address] Phone: [phone]   Fax:  [phone]  Name: Chandlar Staebell MRN: [id number] Date of Birth: 1941-10-10

## 2018-06-06 ENCOUNTER — Ambulatory Visit
Admission: RE | Admit: 2018-06-06 | Discharge: 2018-06-06 | Disposition: A | Discharge status: Home / Self Care | Payer: Medicare HMO | Source: Ambulatory Visit | Attending: Adult Health | Admitting: Adult Health

## 2018-06-06 DIAGNOSIS — R590 Localized enlarged lymph nodes: Secondary | ICD-10-CM | POA: Diagnosis not present

## 2018-06-06 DIAGNOSIS — C50912 Malignant neoplasm of unspecified site of left female breast: Secondary | ICD-10-CM | POA: Diagnosis not present

## 2018-06-06 DIAGNOSIS — R599 Enlarged lymph nodes, unspecified: Secondary | ICD-10-CM

## 2018-06-06 DIAGNOSIS — C50911 Malignant neoplasm of unspecified site of right female breast: Secondary | ICD-10-CM | POA: Diagnosis not present

## 2018-06-07 ENCOUNTER — Encounter: Payer: Self-pay | Admitting: Adult Health

## 2018-06-07 ENCOUNTER — Ambulatory Visit: Payer: Medicare HMO

## 2018-06-07 DIAGNOSIS — M25511 Pain in right shoulder: Secondary | ICD-10-CM | POA: Diagnosis not present

## 2018-06-07 DIAGNOSIS — I972 Postmastectomy lymphedema syndrome: Secondary | ICD-10-CM

## 2018-06-07 DIAGNOSIS — R293 Abnormal posture: Secondary | ICD-10-CM | POA: Diagnosis not present

## 2018-06-07 NOTE — Patient Instructions (Signed)
Start with circles near neck above collarbones, 10 times.   Cancer Rehab [phone] Deep Effective Breath   Standing, sitting, or laying down, place both hands on the belly. Take a deep breath IN, expanding the belly; then breath OUT, contracting the belly. Repeat __5__ times. Do __2-3__ sessions per day and before your self massage.  Axilla to Axilla - Sweep   On uninvolved side make 5 circles in the armpit, then pump _5__ times from involved armpit across chest to uninvolved armpit, making a pathway. Do _1__ time per day.  Copyright  VHI. All rights reserved.  Axilla to Inguinal Nodes - Sweep   On involved side, make 5 circles at groin at panty line, then pump _5__ times from armpit along side of trunk to outer hip, making your other pathway. Do __1_ time per day.  Copyright  VHI. All rights reserved.  Arm Posterior: Elbow to Shoulder - Sweep   Pump _5__ times from back of elbow to top of shoulder. Then inner to outer upper arm _5_ times, then outer arm again _5_ times. Then back to the pathways _2-3_ times. Do _1__ time per day.  Copyright  VHI. All rights reserved.  ARM: [place] Wrist to Elbow - Sweep   Pump or stationary circles _5__ times from wrist to elbow making sure to do both sides of the forearm. Then retrace your steps to the outer arm, and the pathways _2-3_ times each. Do _1__ time per day.  Copyright  VHI. All rights reserved.  ARM: Dorsum of Hand to Shoulder - Sweep   Pump or stationary circles _5__ times on back of hand including knuckle spaces and individual fingers if needed working up towards the wrist, then retrace all your steps working back up the forearm, doing both sides; upper outer arm and back to your pathways _2-3_ times each. Then do 5 circles again at uninvolved armpit and involved groin where you started! Good job!! Do __1_ time per day.

## 2018-06-07 NOTE — Therapy (Signed)
[place], [place], [address] Phone: [phone]   Fax:  [phone]  Physical Therapy Treatment  Patient Details  Name: Jean Nash MRN: [id number] Date of Birth: 02-19-1941 Referring Provider: Dr. Jana Hakim   Encounter Date: 06/07/2018  PT End of Session - 06/07/18 1350    Visit Number  3    Number of Visits  8    Date for PT Re-Evaluation  06/28/18    PT Start Time  1301    PT Stop Time  1351    PT Time Calculation (min)  50 min    Activity Tolerance  Patient tolerated treatment well    Behavior During Therapy  [facility] for tasks assessed/performed       Past Medical History:  Diagnosis Date  . Anxiety    PHOBIAS  . Arthritis   . Breast cancer ([place]) 08/08/13   right LOQ  . Cataract   . Chronic insomnia   . Cluster headaches    history of migraines / NONE FOR SEVERAL YRS  . Depression   . Fibromyalgia   . GERD (gastroesophageal reflux disease)   . H/O hiatal hernia   . History of colonic polyps   . History of transfusion 08/30/2013  . Hx of radiation therapy 10/29/13- 12/14/13   right chest wall 5040 cGy 28 sessions, right supraclavicular/axillary region 5040 cGy 28 sessions, right chest wall boost 1000 cGy 5 sessions  . Hypothyroidism   . Irritable bowel syndrome   . Kidney stone   . Lymphedema    RT ARM - WEARS SLEEVE  . Macular degeneration    hole/right eye  . Osteopenia   . Other abnormal glucose   . Other and unspecified hyperlipidemia   . Pain in joint, shoulder region   . Pneumonia 7106,[id number]  . Sleep apnea    USES C-PAP  . Stress incontinence, female     Past Surgical History:  Procedure Laterality Date  . ABDOMINAL HYSTERECTOMY    . APPENDECTOMY    . BILATERAL TOTAL MASTECTOMY WITH AXILLARY LYMPH NODE DISSECTION  08/30/2013   Dr Barry Dienes  . BREAST CYST ASPIRATION     9 cysts  . CATARACT EXTRACTION, BILATERAL  2005/2007  . CHOLECYSTECTOMY    . COLONOSCOPY    .  EVACUATION BREAST HEMATOMA Left 08/31/2013   Procedure: EVACUATION HEMATOMA BREAST;  Surgeon: Stark Klein, MD;  Location: [place];  Service: General;  Laterality: Left;  . EYE SURGERY     to repair macular hole  . FOOT ARTHROPLASTY     lt   . GANGLION CYST EXCISION     rt foot  . HEMORRHOID SURGERY     03/1993  . JOINT REPLACEMENT  03/15/11   left knee replacement  . KNEE ARTHROSCOPY     /partial knee 2016/left knee 2012  . MASS EXCISION  11/04/2011   Procedure: EXCISION MASS;  Surgeon: Cammie Sickle., MD;  Location: [facility];  Service: Orthopedics;  Laterality: Right;  excisional biopsy right ulna mass  . MASTECTOMY W/ SENTINEL NODE BIOPSY Right 08/30/2013   Procedure: RIGHT  AXILLARY SENTINEL LYMPH NODE BIOPSY; Right Axillary Node Disection;  Surgeon: Stark Klein, MD;  Location: [place];  Service: General;  Laterality: Right;  Right side nuc med 7:00   . PARTIAL KNEE ARTHROPLASTY Right 11/03/2015   Procedure: RIGHT KNEE MEDIAL UNICOMPARTMENTAL ARTHROPLASTY ;  Surgeon: Gaynelle Arabian, MD;  [facility];  Service: Orthopedics;  Laterality: Right;  .  SIMPLE MASTECTOMY WITH AXILLARY SENTINEL NODE BIOPSY Left 08/30/2013   Procedure: Bilateral Breast Mastectomy ;  Surgeon: Stark Klein, MD;  Location: [facility];  Service: General;  Laterality: Left;  . skin tags removed     breast, panty line, neckline  . TOE SURGERY     preventative crossover toe surg/right foot  . TOE SURGERY  2009   left foot/screw  in 2nd toe  . TONSILLECTOMY    . UPPER GASTROINTESTINAL ENDOSCOPY      There were no vitals filed for this visit.  Subjective Assessment - 06/07/18 1311    Subjective  Wearing my old compression sleeve.     Pertinent History  She had a bilateral mastectomy with a right axillary node dissection removing 16 lymph nodes and 9 of those were positive.  Currently taking tamoxifen, Bil TKR    Patient Stated Goals  what to do for the swelling    Currently in Pain?  No/denies                   [facility]patient [facility]  Lymphedema Life Impact Scale Total Score  13.24 %           [facility] Adult PT Treatment/Exercise - 06/07/18 0001      Manual Therapy   Manual Lymphatic Drainage (MLD)  short neck, superficial and deep abdominals, Lt axillary nodes and anterior interaxillary pathway, Rt inguinal nodes and Rt axillo inguinal pathway, then working the Rt UE proximal to distal to the wrist and and then retracing all steps instructing pt throughout for review             PT Education - 06/07/18 1338    Education Details  Self MLD    Person(s) Educated  Patient;Spouse    Methods  Explanation;Demonstration;Handout    Comprehension  Verbalized understanding;Returned demonstration;Need further instruction          PT Long Term Goals - 05/31/18 1649      PT LONG TERM GOAL #1   Title  Pt will obtain appropriate compression garment for the Rt UE and will be knowledgeable about use    Time  4    Period  Weeks    Status  New    Target Date  06/28/18      PT LONG TERM GOAL #2   Title  Pt will have painfree Rt shoulder flexion and abduction    Time  4    Period  Weeks    Status  New    Target Date  06/28/18      PT LONG TERM GOAL #3   Title  Pt will decrease 15cm and 10cm upper arm measurements to 35 and 33    Baseline  37, 35    Time  4    Period  Weeks    Status  New    Target Date  06/28/18      PT LONG TERM GOAL #4   Title  Pt will be ind with MLD for continued care    Time  4    Period  Weeks    Status  New    Target Date  06/28/18            Plan - 06/07/18 1353    Clinical Impression Statement  Instructed/reviewed with pt and husband manual lymph drainage reviewing principles, technique and correct pressure. After review by end of session pt reported remembering alot of it and will  resume doing this at home. Also issued prescription for Circaid reduction kit and  instructions to make an appt with [facility]. They verbalized understanding.     Rehab Potential  Excellent    Clinical Impairments Affecting Rehab Potential  will not do bandaging     PT Frequency  2x / week    PT Duration  3 weeks    PT Treatment/Interventions  ADLs/Self Care Home Management    PT Next Visit Plan  Cont and review Rt UE MLD/teach as able.  ROM into flexion and abduction passive and AAROM    Recommended Other Services  Pt to call [facility], script issued.    Consulted and Agree with Plan of Care  Patient       Patient will benefit from skilled therapeutic intervention in order to improve the following deficits and impairments:  Pain, Increased edema  Visit Diagnosis: Postmastectomy lymphedema  Acute pain of right shoulder     Problem List Patient Active Problem List   Diagnosis Date Noted  . MDD (major depressive disorder), recurrent episode, mild ([place]) 12/22/2017  . Osteoporosis 07/14/2017  . OA (osteoarthritis) of knee 11/03/2015  . Osteopenia 08/08/2015  . Obstructive sleep apnea 06/25/2015  . Headache 02/03/2015  . Atypical chest pain 07/15/2014  . Arthralgia 02/22/2014  . Psoriasis 02/22/2014  . Malignant neoplasm of lower-outer quadrant of right breast of female, estrogen receptor positive ([place]) 08/09/2013  . Neck pain 06/22/2013  . Left knee pain 11/29/2012  . Reactive depression (situational) 04/26/2012  . Right wrist pain 02/02/2012  . Right foot pain 10/04/2011  . Nevus 06/05/2011  . Status post total knee replacement 04/06/2011  . Overactive bladder 03/14/2011  . KNEE PAIN, LEFT 08/31/2010  . HEARING LOSS 08/17/2010  . HIRSUTISM 06/16/2009  . CYST, IDIOPATHIC 05/20/2008  . IRRITABLE BOWEL SYNDROME 04/15/2008  . Allergic asthma, mild intermittent, uncomplicated 01/00/7121  . GERD 03/19/2008  . COLONIC POLYPS, HX OF 03/19/2008  . NEPHROLITHIASIS, HX OF 03/19/2008  . Hypothyroidism 03/18/2008  . Hyperlipidemia 03/18/2008  .  INSOMNIA, CHRONIC 03/18/2008  . FIBROMYALGIA 03/18/2008  . Prediabetes 03/18/2008    Otelia Limes, PTA 06/07/2018, 1:59 PM  [place], [place], [address] Phone: [phone]   Fax:  [phone]  Name: Jean Nash MRN: [id number] Date of Birth: 1941/04/05

## 2018-06-09 ENCOUNTER — Encounter

## 2018-06-12 ENCOUNTER — Encounter: Payer: Self-pay | Admitting: Rehabilitation

## 2018-06-12 ENCOUNTER — Ambulatory Visit: Payer: Medicare HMO | Admitting: Rehabilitation

## 2018-06-12 DIAGNOSIS — M25511 Pain in right shoulder: Secondary | ICD-10-CM

## 2018-06-12 DIAGNOSIS — I972 Postmastectomy lymphedema syndrome: Secondary | ICD-10-CM | POA: Diagnosis not present

## 2018-06-12 DIAGNOSIS — R293 Abnormal posture: Secondary | ICD-10-CM

## 2018-06-12 NOTE — Therapy (Signed)
[place], [place], [address] Phone: [phone]   Fax:  [phone]  Physical Therapy Treatment  Patient Details  Name: Jean Nash MRN: [id number] Date of Birth: Aug 17, 1941 Referring Provider: Dr. Jana Hakim   Encounter Date: 06/12/2018  PT End of Session - 06/12/18 1625    Visit Number  4    Number of Visits  8    Date for PT Re-Evaluation  06/28/18    PT Start Time  1536    PT Stop Time  1620    PT Time Calculation (min)  44 min    Activity Tolerance  Patient tolerated treatment well    Behavior During Therapy  [facility] for tasks assessed/performed       Past Medical History:  Diagnosis Date  . Anxiety    PHOBIAS  . Arthritis   . Breast cancer ([place]) 08/08/13   right LOQ  . Cataract   . Chronic insomnia   . Cluster headaches    history of migraines / NONE FOR SEVERAL YRS  . Depression   . Fibromyalgia   . GERD (gastroesophageal reflux disease)   . H/O hiatal hernia   . History of colonic polyps   . History of transfusion 08/30/2013  . Hx of radiation therapy 10/29/13- 12/14/13   right chest wall 5040 cGy 28 sessions, right supraclavicular/axillary region 5040 cGy 28 sessions, right chest wall boost 1000 cGy 5 sessions  . Hypothyroidism   . Irritable bowel syndrome   . Kidney stone   . Lymphedema    RT ARM - WEARS SLEEVE  . Macular degeneration    hole/right eye  . Osteopenia   . Other abnormal glucose   . Other and unspecified hyperlipidemia   . Pain in joint, shoulder region   . Pneumonia 6301,6010  . Sleep apnea    USES C-PAP  . Stress incontinence, female     Past Surgical History:  Procedure Laterality Date  . ABDOMINAL HYSTERECTOMY    . APPENDECTOMY    . BILATERAL TOTAL MASTECTOMY WITH AXILLARY LYMPH NODE DISSECTION  08/30/2013   Dr Barry Dienes  . BREAST CYST ASPIRATION     9 cysts  . CATARACT EXTRACTION, BILATERAL  2005/2007  . CHOLECYSTECTOMY    . COLONOSCOPY    .  EVACUATION BREAST HEMATOMA Left 08/31/2013   Procedure: EVACUATION HEMATOMA BREAST;  Surgeon: Stark Klein, MD;  Location: [facility];  Service: General;  Laterality: Left;  . EYE SURGERY     to repair macular hole  . FOOT ARTHROPLASTY     lt   . GANGLION CYST EXCISION     rt foot  . HEMORRHOID SURGERY     03/1993  . JOINT REPLACEMENT  03/15/11   left knee replacement  . KNEE ARTHROSCOPY     /partial knee 2016/left knee 2012  . MASS EXCISION  11/04/2011   Procedure: EXCISION MASS;  Surgeon: Cammie Sickle., MD;  Location: [facility];  Service: Orthopedics;  Laterality: Right;  excisional biopsy right ulna mass  . MASTECTOMY W/ SENTINEL NODE BIOPSY Right 08/30/2013   Procedure: RIGHT  AXILLARY SENTINEL LYMPH NODE BIOPSY; Right Axillary Node Disection;  Surgeon: Stark Klein, MD;  Location: [facility];  Service: General;  Laterality: Right;  Right side nuc med 7:00   . PARTIAL KNEE ARTHROPLASTY Right 11/03/2015   Procedure: RIGHT KNEE MEDIAL UNICOMPARTMENTAL ARTHROPLASTY ;  Surgeon: Gaynelle Arabian, MD;  [facility];  Service: Orthopedics;  Laterality: Right;  .  SIMPLE MASTECTOMY WITH AXILLARY SENTINEL NODE BIOPSY Left 08/30/2013   Procedure: Bilateral Breast Mastectomy ;  Surgeon: Stark Klein, MD;  Location: [facility];  Service: General;  Laterality: Left;  . skin tags removed     breast, panty line, neckline  . TOE SURGERY     preventative crossover toe surg/right foot  . TOE SURGERY  2009   left foot/screw  in 2nd toe  . TONSILLECTOMY    . UPPER GASTROINTESTINAL ENDOSCOPY      There were no vitals filed for this visit.  Subjective Assessment - 06/12/18 1536    Subjective  " I have boobs today"  ( got new bra and inserts at [employer]) Got measured for a reduction kit.  Did not do any manual lymph drainage     Pertinent History  She had a bilateral mastectomy with a right axillary node dissection removing 16 lymph nodes and 9 of those were positive.  Currently taking  tamoxifen, Bil TKR    Patient Stated Goals  what to do for the swelling    Currently in Pain?  No/denies                  Outpatient [facility]  Lymphedema Life Impact Scale Total Score  13.24 %           [facility] Adult PT Treatment/Exercise - 06/12/18 0001      Manual Therapy   Manual Lymphatic Drainage (MLD)  pt performed with PT reading instruction per handout given last time with pt performing the whole sequence with correction as needed, then performed by PT; short neck, breathing and deep abdominals, Lt axillary nodes and anterior interaxillary pathway, Rt inguinal nodes and Rt axillo inguinal pathway, then working the Rt UE proximal to distal to the wrist and and then retracing all steps                  PT Long Term Goals - 05/31/18 1649      PT LONG TERM GOAL #1   Title  Pt will obtain appropriate compression garment for the Rt UE and will be knowledgeable about use    Time  4    Period  Weeks    Status  New    Target Date  06/28/18      PT LONG TERM GOAL #2   Title  Pt will have painfree Rt shoulder flexion and abduction    Time  4    Period  Weeks    Status  New    Target Date  06/28/18      PT LONG TERM GOAL #3   Title  Pt will decrease 15cm and 10cm upper arm measurements to 35 and 33    Baseline  37, 35    Time  4    Period  Weeks    Status  New    Target Date  06/28/18      PT LONG TERM GOAL #4   Title  Pt will be ind with MLD for continued care    Time  4    Period  Weeks    Status  New    Target Date  06/28/18            Plan - 06/12/18 1626    Clinical Impression Statement  Continued to review MLD with pt performing whole sequence in supine with PT reading.  Still heavy handed and having some questions about  which direction to move.  Pt encouraged to do this at least once before next visit.      Clinical Impairments Affecting Rehab Potential  will not do  bandaging     PT Frequency  2x / week    PT Duration  3 weeks    PT Treatment/Interventions  ADLs/Self Care Home Management    PT Next Visit Plan  Cont and review Rt UE MLD/teach as able.  ROM into flexion and abduction passive and AAROM, try to DC patient when able       Patient will benefit from skilled therapeutic intervention in order to improve the following deficits and impairments:  Pain, Increased edema  Visit Diagnosis: Postmastectomy lymphedema  Acute pain of right shoulder  Abnormal posture     Problem List Patient Active Problem List   Diagnosis Date Noted  . MDD (major depressive disorder), recurrent episode, mild ([place]) 12/22/2017  . Osteoporosis 07/14/2017  . OA (osteoarthritis) of knee 11/03/2015  . Osteopenia 08/08/2015  . Obstructive sleep apnea 06/25/2015  . Headache 02/03/2015  . Atypical chest pain 07/15/2014  . Arthralgia 02/22/2014  . Psoriasis 02/22/2014  . Malignant neoplasm of lower-outer quadrant of right breast of female, estrogen receptor positive ([place]) 08/09/2013  . Neck pain 06/22/2013  . Left knee pain 11/29/2012  . Reactive depression (situational) 04/26/2012  . Right wrist pain 02/02/2012  . Right foot pain 10/04/2011  . Nevus 06/05/2011  . Status post total knee replacement 04/06/2011  . Overactive bladder 03/14/2011  . KNEE PAIN, LEFT 08/31/2010  . HEARING LOSS 08/17/2010  . HIRSUTISM 06/16/2009  . CYST, IDIOPATHIC 05/20/2008  . IRRITABLE BOWEL SYNDROME 04/15/2008  . Allergic asthma, mild intermittent, uncomplicated 28/09/8866  . GERD 03/19/2008  . COLONIC POLYPS, HX OF 03/19/2008  . NEPHROLITHIASIS, HX OF 03/19/2008  . Hypothyroidism 03/18/2008  . Hyperlipidemia 03/18/2008  . INSOMNIA, CHRONIC 03/18/2008  . FIBROMYALGIA 03/18/2008  . Prediabetes 03/18/2008    Shan Levans, PT 06/12/2018, 4:28 PM  [place], [place], [address] Phone: [phone]    Fax:  [phone]  Name: Jean Nash MRN: [id number] Date of Birth: [birth year]

## 2018-06-14 ENCOUNTER — Ambulatory Visit: Payer: Medicare HMO

## 2018-06-14 DIAGNOSIS — R293 Abnormal posture: Secondary | ICD-10-CM | POA: Diagnosis not present

## 2018-06-14 DIAGNOSIS — M25511 Pain in right shoulder: Secondary | ICD-10-CM | POA: Diagnosis not present

## 2018-06-14 DIAGNOSIS — I972 Postmastectomy lymphedema syndrome: Secondary | ICD-10-CM | POA: Diagnosis not present

## 2018-06-14 NOTE — Therapy (Signed)
[facility] [address] [place], [place], [address] Phone: [phone]   Fax:  [phone]  Physical Therapy Treatment  Patient Details  Name: Jean Nash MRN: [id number] Date of Birth: 08/16/1941 Referring Provider: Dr. Magrinat   Encounter Date: 06/14/2018  PT End of Session - 06/14/18 1605    Visit Number  5    Number of Visits  8    Date for PT Re-Evaluation  06/28/18    PT Start Time  1523    PT Stop Time  1605    PT Time Calculation (min)  42 min    Activity Tolerance  Patient tolerated treatment well    Behavior During Therapy  [facility] for tasks assessed/performed       Past Medical History:  Diagnosis Date  . Anxiety    PHOBIAS  . Arthritis   . Breast cancer ([facility]) 08/08/13   right LOQ  . Cataract   . Chronic insomnia   . Cluster headaches    history of migraines / NONE FOR SEVERAL YRS  . Depression   . Fibromyalgia   . GERD (gastroesophageal reflux disease)   . H/O hiatal hernia   . History of colonic polyps   . History of transfusion 08/30/2013  . Hx of radiation therapy 10/29/13- 12/14/13   right chest wall 5040 cGy 28 sessions, right supraclavicular/axillary region 5040 cGy 28 sessions, right chest wall boost 1000 cGy 5 sessions  . Hypothyroidism   . Irritable bowel syndrome   . Kidney stone   . Lymphedema    RT ARM - WEARS SLEEVE  . Macular degeneration    hole/right eye  . Osteopenia   . Other abnormal glucose   . Other and unspecified hyperlipidemia   . Pain in joint, shoulder region   . Pneumonia 1967,2009  . Sleep apnea    USES C-PAP  . Stress incontinence, female     Past Surgical History:  Procedure Laterality Date  . ABDOMINAL HYSTERECTOMY    . APPENDECTOMY    . BILATERAL TOTAL MASTECTOMY WITH AXILLARY LYMPH NODE DISSECTION  08/30/2013   Dr BYERLY  . BREAST CYST ASPIRATION     9 cysts  . CATARACT EXTRACTION, BILATERAL  2005/2007  . CHOLECYSTECTOMY    . COLONOSCOPY    .  EVACUATION BREAST HEMATOMA Left 08/31/2013   Procedure: EVACUATION HEMATOMA BREAST;  Surgeon: Faera Byerly, MD;  [facility];  Service: General;  Laterality: Left;  . EYE SURGERY     to repair macular hole  . FOOT ARTHROPLASTY     lt   . GANGLION CYST EXCISION     rt foot  . HEMORRHOID SURGERY     03/1993  . JOINT REPLACEMENT  03/15/11   left knee replacement  . KNEE ARTHROSCOPY     /partial knee 2016/left knee 2012  . MASS EXCISION  11/04/2011   Procedure: EXCISION MASS;  Surgeon: Robert V Sypher Jr., MD;  Location: [facility];  Service: Orthopedics;  Laterality: Right;  excisional biopsy right ulna mass  . MASTECTOMY W/ SENTINEL NODE BIOPSY Right 08/30/2013   Procedure: RIGHT  AXILLARY SENTINEL LYMPH NODE BIOPSY; Right Axillary Node Disection;  Surgeon: Faera Byerly, MD;  [facility];  Service: General;  Laterality: Right;  Right side nuc med 7:00   . PARTIAL KNEE ARTHROPLASTY Right 11/03/2015   Procedure: RIGHT KNEE MEDIAL UNICOMPARTMENTAL ARTHROPLASTY ;  Surgeon: Frank Aluisio, MD;  [facility];  Service: Orthopedics;  Laterality: Right;  .   SIMPLE MASTECTOMY WITH AXILLARY SENTINEL NODE BIOPSY Left 08/30/2013   Procedure: Bilateral Breast Mastectomy ;  Surgeon: Faera Byerly, MD;  [facility];  Service: General;  Laterality: Left;  . skin tags removed     breast, panty line, neckline  . TOE SURGERY     preventative crossover toe surg/right foot  . TOE SURGERY  2009   left foot/screw  in 2nd toe  . TONSILLECTOMY    . UPPER GASTROINTESTINAL ENDOSCOPY      There were no vitals filed for this visit.  Subjective Assessment - 06/14/18 1528    Subjective  Really like my new bra, it's really comfortable.     Pertinent History  She had a bilateral mastectomy with a right axillary node dissection removing 16 lymph nodes and 9 of those were positive.  Currently taking tamoxifen, Bil TKR    Patient Stated Goals  what to do for the swelling    Currently in  Pain?  No/denies            LYMPHEDEMA/ONCOLOGY QUESTIONNAIRE - 06/14/18 1557      Right Upper Extremity Lymphedema   15 cm Proximal to Olecranon Process  34.6 cm    10 cm Proximal to Olecranon Process  33.6 cm    Olecranon Process  24.6 cm    15 cm Proximal to Ulnar Styloid Process  23.5 cm    10 cm Proximal to Ulnar Styloid Process  22.5 cm    Just Proximal to Ulnar Styloid Process  16 cm    Across Hand at Thumb Web Space  17.2 cm    At Base of 2nd Digit  -- ring still stuck and unable to remove    At Base of Thumb  6.1 cm           Outpatient Rehab from [facility]/31/2018 [facility]  Lymphedema Life Impact Scale Total Score  13.24 %           [facility] Adult PT Treatment/Exercise - 06/14/18 0001      Manual Therapy   Manual Lymphatic Drainage (MLD)  Reviewing with pt while performing: short neck, superficial and deep abdominals, Lt axillary nodes and anterior interaxillary pathway, Rt inguinal nodes and Rt axillo inguinal pathway, then working the Rt UE proximal to distal to the wrist and and then retracing all steps                  PT Long Term Goals - 05/31/18 1649      PT LONG TERM GOAL #1   Title  Pt will obtain appropriate compression garment for the Rt UE and will be knowledgeable about use    Time  4    Period  Weeks    Status  New    Target Date  06/28/18      PT LONG TERM GOAL #2   Title  Pt will have painfree Rt shoulder flexion and abduction    Time  4    Period  Weeks    Status  New    Target Date  06/28/18      PT LONG TERM GOAL #3   Title  Pt will decrease 15cm and 10cm upper arm measurements to 35 and 33    Baseline  37, 35    Time  4    Period  Weeks    Status  New    Target Date  06/28/18      PT LONG TERM GOAL #  4   Title  Pt will be ind with MLD for continued care    Time  4    Period  Weeks    Status  New    Target Date  06/28/18            Plan - 06/14/18 1605    Clinical  Impression Statement  Pt reports didn't get to try self MLD as of last appt but plan sto before she returns next week so we can address any questions she has. She has been measured for a reduction kit and is awaiting the arrival of that. Remeasured her Rt UE circumference today and other than 1 cm increase at 10 cm from ulnar styloid, her measurements have reduced well. She reports continues to swim daily in their pool which seems to be helping.     Rehab Potential  Excellent    Clinical Impairments Affecting Rehab Potential  will not do bandaging     PT Frequency  2x / week    PT Duration  3 weeks    PT Treatment/Interventions  ADLs/Self Care Home Management    PT Next Visit Plan  Cont and review Rt UE MLD/teach as able.  ROM into flexion and abduction passive and AAROM, try to DC patient when able    Consulted and Agree with Plan of Care  Patient       Patient will benefit from skilled therapeutic intervention in order to improve the following deficits and impairments:  Pain, Increased edema  Visit Diagnosis: Postmastectomy lymphedema  Acute pain of right shoulder  Abnormal posture     Problem List Patient Active Problem List   Diagnosis Date Noted  . MDD (major depressive disorder), recurrent episode, mild ([facility]) 12/22/2017  . Osteoporosis 07/14/2017  . OA (osteoarthritis) of knee 11/03/2015  . Osteopenia 08/08/2015  . Obstructive sleep apnea 06/25/2015  . Headache 02/03/2015  . Atypical chest pain 07/15/2014  . Arthralgia 02/22/2014  . Psoriasis 02/22/2014  . Malignant neoplasm of lower-outer quadrant of right breast of female, estrogen receptor positive ([facility]) 08/09/2013  . Neck pain 06/22/2013  . Left knee pain 11/29/2012  . Reactive depression (situational) 04/26/2012  . Right wrist pain 02/02/2012  . Right foot pain 10/04/2011  . Nevus 06/05/2011  . Status post total knee replacement 04/06/2011  . Overactive bladder 03/14/2011  . KNEE PAIN, LEFT 08/31/2010  .  HEARING LOSS 08/17/2010  . HIRSUTISM 06/16/2009  . CYST, IDIOPATHIC 05/20/2008  . IRRITABLE BOWEL SYNDROME 04/15/2008  . Allergic asthma, mild intermittent, uncomplicated 03/19/2008  . GERD 03/19/2008  . COLONIC POLYPS, HX OF 03/19/2008  . NEPHROLITHIASIS, HX OF 03/19/2008  . Hypothyroidism 03/18/2008  . Hyperlipidemia 03/18/2008  . INSOMNIA, CHRONIC 03/18/2008  . FIBROMYALGIA 03/18/2008  . Prediabetes 03/18/2008    ,  Ann, PTA 06/14/2018, 4:14 PM  [facility] [address] [place], [place], [address] Phone: [phone]   Fax:  [phone]  Name: Natanya Kehoe MRN: [id number] Date of Birth: 04/09/1941   

## 2018-06-18 ENCOUNTER — Encounter: Payer: Self-pay | Admitting: Internal Medicine

## 2018-06-19 ENCOUNTER — Encounter: Payer: Self-pay | Admitting: Rehabilitation

## 2018-06-19 ENCOUNTER — Ambulatory Visit: Payer: Medicare HMO | Attending: Oncology | Admitting: Rehabilitation

## 2018-06-19 DIAGNOSIS — I972 Postmastectomy lymphedema syndrome: Secondary | ICD-10-CM | POA: Diagnosis not present

## 2018-06-19 DIAGNOSIS — R293 Abnormal posture: Secondary | ICD-10-CM

## 2018-06-19 NOTE — Therapy (Signed)
[place], [place], [address] Phone: [phone]   Fax:  [phone]  Physical Therapy Treatment  Patient Details  Name: Jean Nash MRN: [id number] Date of Birth: Apr 07, 1941 Referring Provider: Dr. Jana Hakim   Encounter Date: 06/19/2018  PT End of Session - 06/19/18 1731    Visit Number  6    Number of Visits  8    Date for PT Re-Evaluation  06/28/18    PT Start Time  1602    PT Stop Time  1642    PT Time Calculation (min)  40 min    Activity Tolerance  Patient tolerated treatment well    Behavior During Therapy  [facility] for tasks assessed/performed       Past Medical History:  Diagnosis Date  . Anxiety    PHOBIAS  . Arthritis   . Breast cancer ([place]) 08/08/13   right LOQ  . Cataract   . Chronic insomnia   . Cluster headaches    history of migraines / NONE FOR SEVERAL YRS  . Depression   . Fibromyalgia   . GERD (gastroesophageal reflux disease)   . H/O hiatal hernia   . History of colonic polyps   . History of transfusion 08/30/2013  . Hx of radiation therapy 10/29/13- 12/14/13   right chest wall 5040 cGy 28 sessions, right supraclavicular/axillary region 5040 cGy 28 sessions, right chest wall boost 1000 cGy 5 sessions  . Hypothyroidism   . Irritable bowel syndrome   . Kidney stone   . Lymphedema    RT ARM - WEARS SLEEVE  . Macular degeneration    hole/right eye  . Osteopenia   . Other abnormal glucose   . Other and unspecified hyperlipidemia   . Pain in joint, shoulder region   . Pneumonia 3545,6256  . Sleep apnea    USES C-PAP  . Stress incontinence, female     Past Surgical History:  Procedure Laterality Date  . ABDOMINAL HYSTERECTOMY    . APPENDECTOMY    . BILATERAL TOTAL MASTECTOMY WITH AXILLARY LYMPH NODE DISSECTION  08/30/2013   Dr Barry Dienes  . BREAST CYST ASPIRATION     9 cysts  . CATARACT EXTRACTION, BILATERAL  2005/2007  . CHOLECYSTECTOMY    . COLONOSCOPY    .  EVACUATION BREAST HEMATOMA Left 08/31/2013   Procedure: EVACUATION HEMATOMA BREAST;  Surgeon: Stark Klein, MD;  Location: [facility];  Service: General;  Laterality: Left;  . EYE SURGERY     to repair macular hole  . FOOT ARTHROPLASTY     lt   . GANGLION CYST EXCISION     rt foot  . HEMORRHOID SURGERY     03/1993  . JOINT REPLACEMENT  03/15/11   left knee replacement  . KNEE ARTHROSCOPY     /partial knee 2016/left knee 2012  . MASS EXCISION  11/04/2011   Procedure: EXCISION MASS;  Surgeon: Cammie Sickle., MD;  Location: [facility];  Service: Orthopedics;  Laterality: Right;  excisional biopsy right ulna mass  . MASTECTOMY W/ SENTINEL NODE BIOPSY Right 08/30/2013   Procedure: RIGHT  AXILLARY SENTINEL LYMPH NODE BIOPSY; Right Axillary Node Disection;  Surgeon: Stark Klein, MD;  Location: [facility];  Service: General;  Laterality: Right;  Right side nuc med 7:00   . PARTIAL KNEE ARTHROPLASTY Right 11/03/2015   Procedure: RIGHT KNEE MEDIAL UNICOMPARTMENTAL ARTHROPLASTY ;  Surgeon: Gaynelle Arabian, MD;  [facility];  Service: Orthopedics;  Laterality: Right;  .  SIMPLE MASTECTOMY WITH AXILLARY SENTINEL NODE BIOPSY Left 08/30/2013   Procedure: Bilateral Breast Mastectomy ;  Surgeon: Stark Klein, MD;  Location: [facility];  Service: General;  Laterality: Left;  . skin tags removed     breast, panty line, neckline  . TOE SURGERY     preventative crossover toe surg/right foot  . TOE SURGERY  2009   left foot/screw  in 2nd toe  . TONSILLECTOMY    . UPPER GASTROINTESTINAL ENDOSCOPY      There were no vitals filed for this visit.  Subjective Assessment - 06/19/18 1602    Subjective  Exhausted today. Haven't heard about the garments. Did not try the manual lymph drainage because husband would not help.  Does not want to do it on her own either.      Pertinent History  She had a bilateral mastectomy with a right axillary node dissection removing 16 lymph nodes and 9 of those were  positive.  Currently taking tamoxifen, Bil TKR    Currently in Pain?  No/denies                  [facility]  Lymphedema Life Impact Scale Total Score  13.24 %           [facility] Adult PT Treatment/Exercise - 06/19/18 0001      Manual Therapy   Edema Management  Discussed MLD strategies for home.  Pt mainly wanting husband to do it but he does not seem very agreeable.  She also does not want to read a paper.  PT showed her the video on [employer] website that she can follow along with and they were agreeable to try this.      Manual Lymphatic Drainage (MLD)  short neck, superficial and deep abdominals, Lt axillary nodes and anterior interaxillary pathway, Rt inguinal nodes and Rt axillo inguinal pathway, then working the Rt UE proximal to distal to the wrist and and then retracing all steps.  Lt sidelying posterior interaxillary work added today.                    PT Long Term Goals - 05/31/18 1649      PT LONG TERM GOAL #1   Title  Pt will obtain appropriate compression garment for the Rt UE and will be knowledgeable about use    Time  4    Period  Weeks    Status  New    Target Date  06/28/18      PT LONG TERM GOAL #2   Title  Pt will have painfree Rt shoulder flexion and abduction    Time  4    Period  Weeks    Status  New    Target Date  06/28/18      PT LONG TERM GOAL #3   Title  Pt will decrease 15cm and 10cm upper arm measurements to 35 and 33    Baseline  37, 35    Time  4    Period  Weeks    Status  New    Target Date  06/28/18      PT LONG TERM GOAL #4   Title  Pt will be ind with MLD for continued care    Time  4    Period  Weeks    Status  New    Target Date  06/28/18  Plan - 06/19/18 1731    Clinical Impression Statement  Still not compliant with MLD at home but may watch the video online and attempt this way.  Continued with MLD for the Rt  UE until garments arrive    Clinical Impairments Affecting Rehab Potential  will not do bandaging     PT Frequency  2x / week    PT Duration  3 weeks    PT Treatment/Interventions  ADLs/Self Care Home Management    PT Next Visit Plan  Cont and review Rt UE MLD/teach as able.  ROM into flexion and abduction passive and AAROM, try to DC patient when able       Patient will benefit from skilled therapeutic intervention in order to improve the following deficits and impairments:     Visit Diagnosis: Postmastectomy lymphedema  Abnormal posture     Problem List Patient Active Problem List   Diagnosis Date Noted  . MDD (major depressive disorder), recurrent episode, mild ([place]) 12/22/2017  . Osteoporosis 07/14/2017  . OA (osteoarthritis) of knee 11/03/2015  . Osteopenia 08/08/2015  . Obstructive sleep apnea 06/25/2015  . Headache 02/03/2015  . Atypical chest pain 07/15/2014  . Arthralgia 02/22/2014  . Psoriasis 02/22/2014  . Malignant neoplasm of lower-outer quadrant of right breast of female, estrogen receptor positive ([place]) 08/09/2013  . Neck pain 06/22/2013  . Left knee pain 11/29/2012  . Reactive depression (situational) 04/26/2012  . Right wrist pain 02/02/2012  . Right foot pain 10/04/2011  . Nevus 06/05/2011  . Status post total knee replacement 04/06/2011  . Overactive bladder 03/14/2011  . KNEE PAIN, LEFT 08/31/2010  . HEARING LOSS 08/17/2010  . HIRSUTISM 06/16/2009  . CYST, IDIOPATHIC 05/20/2008  . IRRITABLE BOWEL SYNDROME 04/15/2008  . Allergic asthma, mild intermittent, uncomplicated 58/85/0277  . GERD 03/19/2008  . COLONIC POLYPS, HX OF 03/19/2008  . NEPHROLITHIASIS, HX OF 03/19/2008  . Hypothyroidism 03/18/2008  . Hyperlipidemia 03/18/2008  . INSOMNIA, CHRONIC 03/18/2008  . FIBROMYALGIA 03/18/2008  . Prediabetes 03/18/2008    Shan Levans, PT 06/19/2018, 5:33 PM  [place], [place], [address] Phone: [phone]   Fax:  [phone]  Name: Jean Nash MRN: [id number] Date of Birth: [birth year]

## 2018-06-21 ENCOUNTER — Encounter: Payer: Self-pay | Admitting: Rehabilitation

## 2018-06-21 ENCOUNTER — Ambulatory Visit: Payer: Medicare HMO | Admitting: Rehabilitation

## 2018-06-21 DIAGNOSIS — R293 Abnormal posture: Secondary | ICD-10-CM

## 2018-06-21 DIAGNOSIS — I972 Postmastectomy lymphedema syndrome: Secondary | ICD-10-CM

## 2018-06-21 NOTE — Therapy (Signed)
[place], [place], [address] Phone: [phone]   Fax:  [phone]  Physical Therapy Treatment  Patient Details  Name: Jean Nash MRN: [id number] Date of Birth: 1941-10-14 Referring Provider: Dr. Jana Hakim   Encounter Date: 06/21/2018  PT End of Session - 06/21/18 1610    Visit Number  7    Number of Visits  8    Date for PT Re-Evaluation  06/28/18    PT Start Time  8299    PT Stop Time  1600    PT Time Calculation (min)  45 min    Activity Tolerance  Patient tolerated treatment well    Behavior During Therapy  [facility] for tasks assessed/performed       Past Medical History:  Diagnosis Date  . Anxiety    PHOBIAS  . Arthritis   . Breast cancer ([place]) 08/08/13   right LOQ  . Cataract   . Chronic insomnia   . Cluster headaches    history of migraines / NONE FOR SEVERAL YRS  . Depression   . Fibromyalgia   . GERD (gastroesophageal reflux disease)   . H/O hiatal hernia   . History of colonic polyps   . History of transfusion 08/30/2013  . Hx of radiation therapy 10/29/13- 12/14/13   right chest wall 5040 cGy 28 sessions, right supraclavicular/axillary region 5040 cGy 28 sessions, right chest wall boost 1000 cGy 5 sessions  . Hypothyroidism   . Irritable bowel syndrome   . Kidney stone   . Lymphedema    RT ARM - WEARS SLEEVE  . Macular degeneration    hole/right eye  . Osteopenia   . Other abnormal glucose   . Other and unspecified hyperlipidemia   . Pain in joint, shoulder region   . Pneumonia 3716,9678  . Sleep apnea    USES C-PAP  . Stress incontinence, female     Past Surgical History:  Procedure Laterality Date  . ABDOMINAL HYSTERECTOMY    . APPENDECTOMY    . BILATERAL TOTAL MASTECTOMY WITH AXILLARY LYMPH NODE DISSECTION  08/30/2013   Dr Barry Dienes  . BREAST CYST ASPIRATION     9 cysts  . CATARACT EXTRACTION, BILATERAL  2005/2007  . CHOLECYSTECTOMY    . COLONOSCOPY    .  EVACUATION BREAST HEMATOMA Left 08/31/2013   Procedure: EVACUATION HEMATOMA BREAST;  Surgeon: Stark Klein, MD;  Location: [place];  Service: General;  Laterality: Left;  . EYE SURGERY     to repair macular hole  . FOOT ARTHROPLASTY     lt   . GANGLION CYST EXCISION     rt foot  . HEMORRHOID SURGERY     03/1993  . JOINT REPLACEMENT  03/15/11   left knee replacement  . KNEE ARTHROSCOPY     /partial knee 2016/left knee 2012  . MASS EXCISION  11/04/2011   Procedure: EXCISION MASS;  Surgeon: Cammie Sickle., MD;  Location: [place];  Service: Orthopedics;  Laterality: Right;  excisional biopsy right ulna mass  . MASTECTOMY W/ SENTINEL NODE BIOPSY Right 08/30/2013   Procedure: RIGHT  AXILLARY SENTINEL LYMPH NODE BIOPSY; Right Axillary Node Disection;  Surgeon: Stark Klein, MD;  Location: [place];  Service: General;  Laterality: Right;  Right side nuc med 7:00   . PARTIAL KNEE ARTHROPLASTY Right 11/03/2015   Procedure: RIGHT KNEE MEDIAL UNICOMPARTMENTAL ARTHROPLASTY ;  Surgeon: Gaynelle Arabian, MD;  Location: [facility];  Service: Orthopedics;  Laterality: Right;  .  SIMPLE MASTECTOMY WITH AXILLARY SENTINEL NODE BIOPSY Left 08/30/2013   Procedure: Bilateral Breast Mastectomy ;  Surgeon: Stark Klein, MD;  Location: [place];  Service: General;  Laterality: Left;  . skin tags removed     breast, panty line, neckline  . TOE SURGERY     preventative crossover toe surg/right foot  . TOE SURGERY  2009   left foot/screw  in 2nd toe  . TONSILLECTOMY    . UPPER GASTROINTESTINAL ENDOSCOPY      There were no vitals filed for this visit.  Subjective Assessment - 06/21/18 1519    Subjective  Pt arrives with Juzo compression wrap velcro garment.      Pertinent History  She had a bilateral mastectomy with a right axillary node dissection removing 16 lymph nodes and 9 of those were positive.  Currently taking tamoxifen, Bil TKR    Patient Stated Goals  what to do for the swelling    Currently  in Pain?  No/denies            LYMPHEDEMA/ONCOLOGY QUESTIONNAIRE - 06/21/18 1529      Right Upper Extremity Lymphedema   15 cm Proximal to Olecranon Process  35.5 cm    10 cm Proximal to Olecranon Process  34.5 cm           [facility]  Lymphedema Life Impact Scale Total Score  13.24 %           [facility] Adult PT Treatment/Exercise - 06/21/18 0001      Manual Therapy   Edema Management  Discussed use of garment; how to tighten the straps and how long to wear it    Manual Lymphatic Drainage (MLD)  Pt performed whole sequence with PT reading the steps.  Then performed by PT a shortened version of the whole sequence with time remaining              PT Education - 06/21/18 1610    Education Details  juzo compression wrap use    Person(s) Educated  Patient;Spouse    Methods  Explanation;Demonstration    Comprehension  Verbalized understanding          PT Long Term Goals - 06/21/18 1526      PT LONG TERM GOAL #1   Title  Pt will obtain appropriate compression garment for the Rt UE and will be knowledgeable about use    Status  Achieved      PT LONG TERM GOAL #2   Title  Pt will have painfree Rt shoulder flexion and abduction    Status  Partially Met      PT LONG TERM GOAL #3   Title  Pt will decrease 15cm and 10cm upper arm measurements to 35 and 33    Baseline  34.5, 36    Status  Not Met      PT LONG TERM GOAL #4   Title  Pt will be ind with MLD for continued care    Status  Achieved            Plan - 06/21/18 1611    Clinical Impression Statement  Avacyn returns today with velcro garment.  Fit looks good but reminded to keep the elbow seam at the elbow and to put on from wrist to axilla.  Pt will wear this to attempt further reduction.  Still not doing MLD at home.  Pt not wanting to DC today but she is now  ind with self MLD despite not performing it and has her short stretch  bandage replacements with her velcro garment.  Her measurements have gone down since eval  despite being up slightly from last week.      Clinical Impairments Affecting Rehab Potential  will not do bandaging        Patient will benefit from skilled therapeutic intervention in order to improve the following deficits and impairments:  Pain, Increased edema  Visit Diagnosis: Postmastectomy lymphedema  Abnormal posture     Problem List Patient Active Problem List   Diagnosis Date Noted  . MDD (major depressive disorder), recurrent episode, mild ([place]) 12/22/2017  . Osteoporosis 07/14/2017  . OA (osteoarthritis) of knee 11/03/2015  . Osteopenia 08/08/2015  . Obstructive sleep apnea 06/25/2015  . Headache 02/03/2015  . Atypical chest pain 07/15/2014  . Arthralgia 02/22/2014  . Psoriasis 02/22/2014  . Malignant neoplasm of lower-outer quadrant of right breast of female, estrogen receptor positive ([place]) 08/09/2013  . Neck pain 06/22/2013  . Left knee pain 11/29/2012  . Reactive depression (situational) 04/26/2012  . Right wrist pain 02/02/2012  . Right foot pain 10/04/2011  . Nevus 06/05/2011  . Status post total knee replacement 04/06/2011  . Overactive bladder 03/14/2011  . KNEE PAIN, LEFT 08/31/2010  . HEARING LOSS 08/17/2010  . HIRSUTISM 06/16/2009  . CYST, IDIOPATHIC 05/20/2008  . IRRITABLE BOWEL SYNDROME 04/15/2008  . Allergic asthma, mild intermittent, uncomplicated 32/99/2426  . GERD 03/19/2008  . COLONIC POLYPS, HX OF 03/19/2008  . NEPHROLITHIASIS, HX OF 03/19/2008  . Hypothyroidism 03/18/2008  . Hyperlipidemia 03/18/2008  . INSOMNIA, CHRONIC 03/18/2008  . FIBROMYALGIA 03/18/2008  . Prediabetes 03/18/2008    Shan Levans, PT 06/21/2018, 4:14 PM  [place], [place], [address] Phone: [phone]   Fax:  [phone]  Name: Azha Constantin MRN: [id number] Date of Birth: Aug 17, 1941    PHYSICAL THERAPY DISCHARGE SUMMARY  Visits from Start of Care: 7  Current functional level related to goals / functional outcomes: Pt now independent with self care    Education / Equipment: Juzo compression   Plan: Patient agrees to discharge.  Patient goals were partially met. Patient is being discharged due to being pleased with the current functional level.  ?????    Shan Levans, PT

## 2018-06-26 ENCOUNTER — Encounter: Payer: Self-pay | Admitting: Rehabilitation

## 2018-06-29 ENCOUNTER — Encounter: Payer: Self-pay | Admitting: Physical Therapy

## 2018-07-20 ENCOUNTER — Encounter: Payer: Self-pay | Admitting: Adult Health

## 2018-07-20 ENCOUNTER — Ambulatory Visit (INDEPENDENT_AMBULATORY_CARE_PROVIDER_SITE_OTHER): Payer: Medicare HMO | Admitting: Adult Health

## 2018-07-20 VITALS — BP 108/66 | Temp 98.0°F | Ht 59.0 in | Wt 155.0 lb

## 2018-07-20 DIAGNOSIS — E039 Hypothyroidism, unspecified: Secondary | ICD-10-CM | POA: Diagnosis not present

## 2018-07-20 DIAGNOSIS — Z0000 Encounter for general adult medical examination without abnormal findings: Secondary | ICD-10-CM | POA: Diagnosis not present

## 2018-07-20 DIAGNOSIS — E785 Hyperlipidemia, unspecified: Secondary | ICD-10-CM | POA: Diagnosis not present

## 2018-07-20 DIAGNOSIS — Z23 Encounter for immunization: Secondary | ICD-10-CM | POA: Diagnosis not present

## 2018-07-20 DIAGNOSIS — R69 Illness, unspecified: Secondary | ICD-10-CM | POA: Diagnosis not present

## 2018-07-20 DIAGNOSIS — F329 Major depressive disorder, single episode, unspecified: Secondary | ICD-10-CM

## 2018-07-20 DIAGNOSIS — M810 Age-related osteoporosis without current pathological fracture: Secondary | ICD-10-CM | POA: Diagnosis not present

## 2018-07-20 LAB — BASIC METABOLIC PANEL
BUN: 17 mg/dL (ref 6–23)
CO2: 29 mEq/L (ref 19–32)
CREATININE: 0.58 mg/dL (ref 0.40–1.20)
Calcium: 9.2 mg/dL (ref 8.4–10.5)
Chloride: 105 mEq/L (ref 96–112)
GFR: 107.64 mL/min (ref >60.00)
Glucose, Bld: 103 mg/dL — ABNORMAL HIGH (ref 70–99)
POTASSIUM: 4.2 meq/L (ref 3.5–5.1)
Sodium: 140 mEq/L (ref 135–145)

## 2018-07-20 LAB — CBC WITH DIFFERENTIAL/PLATELET
Basophils Absolute: 0 10*3/uL (ref 0.0–0.1)
Basophils Relative: 0.4 % (ref 0.0–3.0)
EOS ABS: 0.2 10*3/uL (ref 0.0–0.7)
Eosinophils Relative: 2.5 % (ref 0.0–5.0)
HCT: 43.4 % (ref 36.0–46.0)
HEMOGLOBIN: 14.7 g/dL (ref 12.0–15.0)
Lymphocytes Relative: 24.4 % (ref 12.0–46.0)
Lymphs Abs: 1.5 10*3/uL (ref 0.7–4.0)
MCHC: 33.9 g/dL (ref 30.0–36.0)
MCV: 89.9 fl (ref 78.0–100.0)
MONO ABS: 0.4 10*3/uL (ref 0.1–1.0)
Monocytes Relative: 6.7 % (ref 3.0–12.0)
Neutro Abs: 4 10*3/uL (ref 1.4–7.7)
Neutrophils Relative %: 66 % (ref 43.0–77.0)
Platelets: 164 10*3/uL (ref 150.0–400.0)
RBC: 4.82 Mil/uL (ref 3.87–5.11)
RDW: 14 % (ref 11.5–15.5)
WBC: 6.1 10*3/uL (ref 4.0–10.5)

## 2018-07-20 LAB — LIPID PANEL
CHOL/HDL RATIO: 4
Cholesterol: 164 mg/dL (ref 0–200)
HDL: 46.5 mg/dL (ref >39.00)
LDL CALC: 96 mg/dL (ref 0–99)
NonHDL: 117.31
Triglycerides: 105 mg/dL (ref 0.0–149.0)
VLDL: 21 mg/dL (ref 0.0–40.0)

## 2018-07-20 LAB — HEPATIC FUNCTION PANEL
ALT: 34 U/L (ref 0–35)
AST: 19 U/L (ref 0–37)
Albumin: 4.2 g/dL (ref 3.5–5.2)
Alkaline Phosphatase: 45 U/L (ref 39–117)
BILIRUBIN DIRECT: 0.1 mg/dL (ref 0.0–0.3)
BILIRUBIN TOTAL: 0.5 mg/dL (ref 0.2–1.2)
Total Protein: 6.3 g/dL (ref 6.0–8.3)

## 2018-07-20 LAB — TSH: TSH: 0.84 u[IU]/mL (ref 0.35–4.50)

## 2018-07-20 NOTE — Progress Notes (Signed)
Subjective:    [name], female    DOB: 05/12/1941, 77 y.o.   MRN: [id number]  HPI Patient presents for yearly preventative medicine examination. She is a pleasant 77 year old female who  has a past medical history of Anxiety, Arthritis, Breast cancer ([place]) (08/08/13), Cataract, Chronic insomnia, Cluster headaches, Depression, Fibromyalgia, GERD (gastroesophageal reflux disease), H/O hiatal hernia, History of colonic polyps, History of transfusion (08/30/2013), radiation therapy (10/29/13- 12/14/13), Hypothyroidism, Irritable bowel syndrome, Kidney stone, Lymphedema, Macular degeneration, Osteopenia, Other abnormal glucose, Other and unspecified hyperlipidemia, Pain in joint, shoulder region, Pneumonia (6160,7371), Sleep apnea, and Stress incontinence, female.   Hyperlipidemia - Takes Zocor and Lovasa   Hypothyroidism - takes Synthroid 100 mcg daily - stable  Lab Results  Component Value Date   TSH 0.75 07/14/2017    GERD -takes Nexium daily  Depression/Anxiety - Well controlled on Effexor - she is seen by psychiatry. Reports that she started taking this at night and is sleeping sounder    Osteoporosis -last DEXA scan was in 2018.  Currently takes calcium and vitamin D supplement  All immunizations and health maintenance protocols were reviewed with the patient and needed orders were placed. Due for seasonal flu   Appropriate screening laboratory values were ordered for the patient including screening of hyperlipidemia, renal function and hepatic function. If indicated by BPH, a PSA was ordered.  Medication reconciliation,  past medical history, social history, problem list and allergies were reviewed in detail with the patient  Goals were established with regard to weight loss, exercise, and  diet in compliance with medications.  She has been following a low-carb no sugar diet and has been swimming in her pool at home on a daily basis.  For the last year she is been  able to lose about 30 pounds  Wt Readings from Last 3 Encounters:  07/20/18 155 lb (70.3 kg)  05/31/18 160 lb (72.6 kg)  02/28/18 164 lb (74.4 kg)   End of life planning was discussed.  He is up-to-date on health maintenance items such as colonoscopy, dental and vision screens.   Review of Systems  Constitutional: Negative.   HENT: Negative.   Eyes: Negative.   Respiratory: Negative.   Cardiovascular: Negative.   Gastrointestinal: Negative.   Endocrine: Negative.   Genitourinary: Negative.   Musculoskeletal: Negative.   Skin: Negative.   Allergic/Immunologic: Negative.   Neurological: Negative.   Hematological: Negative.   Psychiatric/Behavioral: Positive for sleep disturbance.   Past Medical History:  Diagnosis Date  . Anxiety    PHOBIAS  . Arthritis   . Breast cancer ([place]) 08/08/13   right LOQ  . Cataract   . Chronic insomnia   . Cluster headaches    history of migraines / NONE FOR SEVERAL YRS  . Depression   . Fibromyalgia   . GERD (gastroesophageal reflux disease)   . H/O hiatal hernia   . History of colonic polyps   . History of transfusion 08/30/2013  . Hx of radiation therapy 10/29/13- 12/14/13   right chest wall 5040 cGy 28 sessions, right supraclavicular/axillary region 5040 cGy 28 sessions, right chest wall boost 1000 cGy 5 sessions  . Hypothyroidism   . Irritable bowel syndrome   . Kidney stone   . Lymphedema    RT ARM - WEARS SLEEVE  . Macular degeneration    hole/right eye  . Osteopenia   . Other abnormal glucose   . Other and unspecified hyperlipidemia   . Pain in joint,  shoulder region   . Pneumonia 3762,8315  . Sleep apnea    USES C-PAP  . Stress incontinence, female     Social History   Socioeconomic History  . Marital status: Married    Spouse name: Not on file  . Number of children: 3  . Years of education: Not on file  . Highest education level: Not on file  Occupational History  . Occupation: retired Medical laboratory scientific officer    . Financial resource strain: Not on file  . Food insecurity:    Worry: Not on file    Inability: Not on file  . Transportation needs:    Medical: Not on file    Non-medical: Not on file  Tobacco Use  . Smoking status: Former Smoker    Packs/day: 0.10    Years: 2.00    Pack years: 0.20    Types: Cigarettes    Start date: 11/16/1959    Last attempt to quit: 11/15/1960    Years since quitting: 57.7  . Smokeless tobacco: Never Used  Substance and Sexual Activity  . Alcohol use: No    Alcohol/week: 0.0 standard drinks  . Drug use: No  . Sexual activity: Not on file    Comment: menarche age 22, fist live birth 53, P 3, hysterectomy age 80, no HRT, BCP 2 yrs  Lifestyle  . Physical activity:    Days per week: Not on file    Minutes per session: Not on file  . Stress: Not on file  Relationships  . Social connections:    Talks on phone: Not on file    Gets together: Not on file    Attends religious service: Not on file    Active member of club or organization: Not on file    Attends meetings of clubs or organizations: Not on file    Relationship status: Not on file  . Intimate partner violence:    Fear of current or ex partner: Not on file    Emotionally abused: Not on file    Physically abused: Not on file    Forced sexual activity: Not on file  Other Topics Concern  . 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 ABDOMINAL HYSTERECTOMY    . APPENDECTOMY    . BILATERAL TOTAL MASTECTOMY WITH AXILLARY LYMPH NODE DISSECTION  08/30/2013   Dr Barry Dienes  . BREAST CYST ASPIRATION     9 cysts  . CATARACT EXTRACTION, BILATERAL  2005/2007  . CHOLECYSTECTOMY    . COLONOSCOPY    . EVACUATION BREAST HEMATOMA Left 08/31/2013   Procedure: EVACUATION HEMATOMA BREAST;  Surgeon: Stark Klein, MD;  Location: [place];  Service: General;   Laterality: Left;  . EYE SURGERY     to repair macular hole  . FOOT ARTHROPLASTY     lt   . GANGLION CYST EXCISION     rt foot  . HEMORRHOID SURGERY     03/1993  . JOINT REPLACEMENT  03/15/11   left knee replacement  . KNEE ARTHROSCOPY     /partial knee 2016/left knee 2012  . MASS EXCISION  11/04/2011   Procedure: EXCISION MASS;  Surgeon: Cammie Sickle., MD;  Location: [place];  Service: Orthopedics;  Laterality:  Right;  excisional biopsy right ulna mass  . MASTECTOMY W/ SENTINEL NODE BIOPSY Right 08/30/2013   Procedure: RIGHT  AXILLARY SENTINEL LYMPH NODE BIOPSY; Right Axillary Node Disection;  Surgeon: Stark Klein, MD;  Location: [place];  Service: General;  Laterality: Right;  Right side nuc med 7:00   . PARTIAL KNEE ARTHROPLASTY Right 11/03/2015   Procedure: RIGHT KNEE MEDIAL UNICOMPARTMENTAL ARTHROPLASTY ;  Surgeon: Gaynelle Arabian, MD;  Location[facility];  Service: Orthopedics;  Laterality: Right;  . SIMPLE MASTECTOMY WITH AXILLARY SENTINEL NODE BIOPSY Left 08/30/2013   Procedure: Bilateral Breast Mastectomy ;  Surgeon: Stark Klein, MD;  Location: [place];  Service: General;  Laterality: Left;  . skin tags removed     breast, panty line, neckline  . TOE SURGERY     preventative crossover toe surg/right foot  . TOE SURGERY  2009   left foot/screw  in 2nd toe  . TONSILLECTOMY    . UPPER GASTROINTESTINAL ENDOSCOPY      Family History  Problem Relation Age of Onset  . Stroke Mother        died age 49  . Diabetes Mother   . Breast cancer Mother 35  . Breast cancer Sister 92  . Breast cancer Paternal Aunt 27  . Diabetes Maternal Grandfather   . Breast cancer Paternal Grandmother 76  . Breast cancer Paternal Aunt        dx in her 82s  . Cancer Maternal Grandmother        intra-abdominal cancer  . Brain cancer Maternal Uncle 8  . Brain cancer Cousin 74       maternal cousin  . Brain cancer Cousin 20       paternal cousin  . Colon cancer Neg Hx      No Known Allergies  Current Outpatient Medications on File Prior to Visit  Medication Sig Dispense Refill  . Calcium Carb-Cholecalciferol (CALCIUM-VITAMIN D) 500-400 MG-UNIT TABS Take by mouth 2 (two) times daily.     . cholecalciferol (VITAMIN D) 1000 units tablet Take 2,000 Units by mouth daily.    Marland Kitchen co-enzyme Q-10 30 MG capsule Take 300 mg by mouth daily.     . cyclobenzaprine (FLEXERIL) 10 MG tablet Take 1 tablet (10 mg total) by mouth 3 (three) times daily as needed for muscle spasms. 90 tablet 0  . diclofenac sodium (VOLTAREN) 1 % GEL as needed.     Marland Kitchen esomeprazole (NEXIUM) 40 MG capsule Take 1 capsule by mouth  daily 90 capsule 1  . levothyroxine (SYNTHROID, LEVOTHROID) 100 MCG tablet Take 1 tablet (100 mcg total) daily before breakfast by mouth. Take one pill in middle of night 90 tablet 3  . LUTEIN PO Take by mouth.    . Melatonin 3 MG CAPS Take by mouth at bedtime.    . meloxicam (MOBIC) 7.5 MG tablet Take 1 tablet (7.5 mg total) daily by mouth. 90 tablet 1  . methocarbamol (ROBAXIN) 500 MG tablet Take 500 mg by mouth every 6 (six) hours as needed for muscle spasms.    Marland Kitchen omega-3 acid ethyl esters (LOVAZA) 1 g capsule Take 1 capsule (1 g total) 2 (two) times daily by mouth. 180 capsule 3  . oxyCODONE-acetaminophen (PERCOCET/ROXICET) 5-325 MG tablet Take 1 tablet by mouth every 8 (eight) hours as needed for severe pain. 8 tablet 0  . polyethylene glycol (MIRALAX / GLYCOLAX) packet Take 17 g by mouth daily.    . pregabalin (LYRICA) 75 MG capsule Take 1 capsule (  75 mg total) by mouth 2 (two) times daily. 180 capsule 0  . saccharomyces boulardii (FLORASTOR) 250 MG capsule Take 250 mg by mouth daily.    . simvastatin (ZOCOR) 10 MG tablet TAKE 1 TABLET BY MOUTH AT BEDTIME 90 tablet 1  . tamoxifen (NOLVADEX) 20 MG tablet Take 1 tablet (20 mg total) daily by mouth. 90 tablet 4  . traMADol (ULTRAM) 50 MG tablet Take 1-2 tablets (50-100 mg total) by mouth every 6 (six) hours as needed (mild  pain). 80 tablet 1  . TURMERIC PO Take 1,000 mg by mouth daily.    Marland Kitchen umeclidinium-vilanterol (ANORO ELLIPTA) 62.5-25 MCG/INH AEPB Inhale 1 puff into the lungs daily. 14 each 0  . umeclidinium-vilanterol (ANORO ELLIPTA) 62.5-25 MCG/INH AEPB Inhale 1 puff into the lungs daily. 60 each 6  . venlafaxine XR (EFFEXOR-XR) 150 MG 24 hr capsule Take 1 capsule (150 mg total) by mouth daily. 90 capsule 1   No current facility-administered medications on file prior to visit.     BP 108/66   Temp 98 F (36.7 C)   Ht 4\' 11"  (1.499 m) Comment: WITHOUT SHOES  Wt 155 lb (70.3 kg)   BMI 31.31 kg/m       Objective:   Physical Exam  Constitutional: She is oriented to person, place, and time. She appears well-developed and well-nourished. No distress.  HENT:  Head: Normocephalic and atraumatic.  Right Ear: External ear normal.  Left Ear: External ear normal.  Nose: Nose normal.  Mouth/Throat: Oropharynx is clear and moist. No oropharyngeal exudate.  Eyes: Pupils are equal, round, and reactive to light. Conjunctivae and EOM are normal. Right eye exhibits no discharge. Left eye exhibits no discharge. No scleral icterus.  Neck: Normal range of motion. Neck supple. No JVD present. No tracheal deviation present. No thyromegaly present.  Cardiovascular: Normal rate, regular rhythm, normal heart sounds and intact distal pulses. Exam reveals no gallop and no friction rub.  No murmur heard. Pulmonary/Chest: Effort normal and breath sounds normal. No stridor. No respiratory distress. She has no wheezes. She has no rales. She exhibits no tenderness.  Abdominal: Soft. Bowel sounds are normal. She exhibits no distension and no mass. There is no tenderness. There is no rebound and no guarding. No hernia.  Genitourinary:  Genitourinary Comments: Double mastectomy    Musculoskeletal: Normal range of motion. She exhibits no edema, tenderness or deformity.  Lymphadenopathy:    She has no cervical adenopathy.    Neurological: She is alert and oriented to person, place, and time. She displays normal reflexes. No cranial nerve deficit or sensory deficit. She exhibits normal muscle tone. Coordination normal.  Skin: Skin is warm and dry. Capillary refill takes less than 2 seconds. No rash noted. She is not diaphoretic. No erythema. No pallor.  Psychiatric: She has a normal mood and affect. Her behavior is normal. Judgment and thought content normal.  Nursing note and vitals reviewed.     Assessment & Plan:  1. Routine general medical examination at a health care facility  - Basic metabolic panel - CBC with Differential/Platelet - Hepatic function panel - Lipid panel - TSH  2. Need for prophylactic vaccination and inoculation against influenza  - Flu vaccine HIGH DOSE PF (Fluzone High dose)  3. Osteoporosis without current pathological fracture, unspecified osteoporosis type - Continue with Calcium, vitamin d, and swimming.  - Bone density screen next year   4. Reactive depression (situational) - Controlled with Effexor. Advised continue follow up with Psychiatry  5. Hypothyroidism, unspecified type - Consider increase in synthroid  - Basic metabolic panel - CBC with Differential/Platelet - Hepatic function panel - Lipid panel - TSH   6. Hyperlipidemia, unspecified hyperlipidemia type - Consider increase in statin  - Basic metabolic panel - CBC with Differential/Platelet - Hepatic function panel - Lipid panel - TSH  [name], NP

## 2018-08-01 ENCOUNTER — Encounter: Payer: Self-pay | Admitting: Adult Health

## 2018-08-01 ENCOUNTER — Ambulatory Visit (INDEPENDENT_AMBULATORY_CARE_PROVIDER_SITE_OTHER): Payer: Medicare HMO | Admitting: Adult Health

## 2018-08-01 ENCOUNTER — Ambulatory Visit (INDEPENDENT_AMBULATORY_CARE_PROVIDER_SITE_OTHER): Payer: Medicare HMO

## 2018-08-01 VITALS — BP 124/80 | Temp 98.1°F | Wt 154.0 lb

## 2018-08-01 DIAGNOSIS — M545 Low back pain, unspecified: Secondary | ICD-10-CM

## 2018-08-01 DIAGNOSIS — Z760 Encounter for issue of repeat prescription: Secondary | ICD-10-CM

## 2018-08-01 MED ORDER — ESOMEPRAZOLE MAGNESIUM 40 MG PO CPDR: DELAYED_RELEASE_CAPSULE | ORAL | 3 refills | Status: DC

## 2018-08-01 MED ORDER — DIAZEPAM 5 MG PO TABS: ORAL_TABLET | ORAL | 1 refills | Status: DC

## 2018-08-01 MED ORDER — SIMVASTATIN 10 MG PO TABS: 10.0000 mg | ORAL_TABLET | Freq: Every day | ORAL | 3 refills | Status: DC

## 2018-08-01 MED ORDER — OMEGA-3-ACID ETHYL ESTERS 1 G PO CAPS: 1.0000 | ORAL_CAPSULE | Freq: Two times a day (BID) | ORAL | 3 refills | Status: DC

## 2018-08-01 MED ORDER — LEVOTHYROXINE SODIUM 100 MCG PO TABS: 100.0000 ug | ORAL_TABLET | Freq: Every day | ORAL | 3 refills | Status: DC

## 2018-08-01 MED ORDER — PREGABALIN 75 MG PO CAPS: 75.0000 mg | ORAL_CAPSULE | Freq: Two times a day (BID) | ORAL | 0 refills | Status: DC

## 2018-08-01 NOTE — Progress Notes (Signed)
Subjective:    [name], female    DOB: May 16, 1941, 77 y.o.   MRN: [id number]  HPI 77 year old female who  has a past medical history of Anxiety, Arthritis, Breast cancer ([place]) (08/08/13), Cataract, Chronic insomnia, Cluster headaches, Depression, Fibromyalgia, GERD (gastroesophageal reflux disease), H/O hiatal hernia, History of colonic polyps, History of transfusion (08/30/2013), radiation therapy (10/29/13- 12/14/13), Hypothyroidism, Irritable bowel syndrome, Kidney stone, Lymphedema, Macular degeneration, Osteopenia, Other abnormal glucose, Other and unspecified hyperlipidemia, Pain in joint, shoulder region, Pneumonia (8546,2703), Sleep apnea, and Stress incontinence, female.  She presents to the office today for an acute issue of low back pain. She was in her usual state of healthy yesterday and early this morning ( she swam two hours yesterday). When she was sitting in the chair this morning she started having low back pain, as the morning and afternoon progressed pain became worse.  This morning she used a heating pad which did not help much and then took a dose of Motrin which she reports that she got no relief from.  3 hours ago she took 1 of her prescribed Flexeril and has started to notice improvement pain relief.  Pain is worse with ambulation and change positions.  She denies any numbness or tingling in her lower extremities or loss of bowel or bladder.  Additionally she needs multiple medications refilled  Review of Systems See HPI   Past Medical History:  Diagnosis Date  . Anxiety    PHOBIAS  . Arthritis   . Breast cancer ([place]) 08/08/13   right LOQ  . Cataract   . Chronic insomnia   . Cluster headaches    history of migraines / NONE FOR SEVERAL YRS  . Depression   . Fibromyalgia   . GERD (gastroesophageal reflux disease)   . H/O hiatal hernia   . History of colonic polyps   . History of transfusion 08/30/2013  . Hx of radiation therapy 10/29/13- 12/14/13    right chest wall 5040 cGy 28 sessions, right supraclavicular/axillary region 5040 cGy 28 sessions, right chest wall boost 1000 cGy 5 sessions  . Hypothyroidism   . Irritable bowel syndrome   . Kidney stone   . Lymphedema    RT ARM - WEARS SLEEVE  . Macular degeneration    hole/right eye  . Osteopenia   . Other abnormal glucose   . Other and unspecified hyperlipidemia   . Pain in joint, shoulder region   . Pneumonia 5009,3818  . Sleep apnea    USES C-PAP  . Stress incontinence, female     Social History   Socioeconomic History  . Marital status: Married    Spouse name: Not on file  . Number of children: 3  . Years of education: Not on file  . Highest education level: Not on file  Occupational History  . Occupation: retired Medical laboratory scientific officer  . Financial resource strain: Not on file  . Food insecurity:    Worry: Not on file    Inability: Not on file  . Transportation needs:    Medical: Not on file    Non-medical: Not on file  Tobacco Use  . Smoking status: Former Smoker    Packs/day: 0.10    Years: 2.00    Pack years: 0.20    Types: Cigarettes    Start date: 11/16/1959    Last attempt to quit: 11/15/1960    Years since quitting: 57.7  . Smokeless tobacco: Never Used  Substance and  Sexual Activity  . Alcohol use: No    Alcohol/week: 0.0 standard drinks  . Drug use: No  . Sexual activity: Not on file    Comment: menarche age 64, fist live birth 28, P 3, hysterectomy age 71, no HRT, BCP 2 yrs  Lifestyle  . Physical activity:    Days per week: Not on file    Minutes per session: Not on file  . Stress: Not on file  Relationships  . Social connections:    Talks on phone: Not on file    Gets together: Not on file    Attends religious service: Not on file    Active member of club or organization: Not on file    Attends meetings of clubs or organizations: Not on file    Relationship status: Not on file  . Intimate partner violence:    Fear of current or ex  partner: Not on file    Emotionally abused: Not on file    Physically abused: Not on file    Forced sexual activity: Not on file  Other Topics Concern  . 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 ABDOMINAL HYSTERECTOMY    . APPENDECTOMY    . BILATERAL TOTAL MASTECTOMY WITH AXILLARY LYMPH NODE DISSECTION  08/30/2013   Dr Barry Dienes  . BREAST CYST ASPIRATION     9 cysts  . CATARACT EXTRACTION, BILATERAL  2005/2007  . CHOLECYSTECTOMY    . COLONOSCOPY    . EVACUATION BREAST HEMATOMA Left 08/31/2013   Procedure: EVACUATION HEMATOMA BREAST;  Surgeon: Stark Klein, MD;  Location: [facility];  Service: General;  Laterality: Left;  . EYE SURGERY     to repair macular hole  . FOOT ARTHROPLASTY     lt   . GANGLION CYST EXCISION     rt foot  . HEMORRHOID SURGERY     03/1993  . JOINT REPLACEMENT  03/15/11   left knee replacement  . KNEE ARTHROSCOPY     /partial knee 2016/left knee 2012  . MASS EXCISION  11/04/2011   Procedure: EXCISION MASS;  Surgeon: Cammie Sickle., MD;  Location: [facility];  Service: Orthopedics;  Laterality: Right;  excisional biopsy right ulna mass  . MASTECTOMY W/ SENTINEL NODE BIOPSY Right 08/30/2013   Procedure: RIGHT  AXILLARY SENTINEL LYMPH NODE BIOPSY; Right Axillary Node Disection;  Surgeon: Stark Klein, MD;  Location: [facility];  Service: General;  Laterality: Right;  Right side nuc med 7:00   . PARTIAL KNEE ARTHROPLASTY Right 11/03/2015   Procedure: RIGHT KNEE MEDIAL UNICOMPARTMENTAL ARTHROPLASTY ;  Surgeon: Gaynelle Arabian, MD;  [facility];  Service: Orthopedics;  Laterality: Right;  . SIMPLE MASTECTOMY WITH AXILLARY SENTINEL NODE BIOPSY Left 08/30/2013   Procedure: Bilateral Breast Mastectomy ;  Surgeon: Stark Klein, MD;  Location: [facility];  Service: General;  Laterality: Left;  .  skin tags removed     breast, panty line, neckline  . TOE SURGERY     preventative crossover toe surg/right foot  . TOE SURGERY  2009   left foot/screw  in 2nd toe  . TONSILLECTOMY    . UPPER GASTROINTESTINAL ENDOSCOPY      Family History  Problem Relation Age of Onset  . Stroke  Mother        died age 32  . Diabetes Mother   . Breast cancer Mother 78  . Breast cancer Sister 6  . Breast cancer Paternal Aunt 4  . Diabetes Maternal Grandfather   . Breast cancer Paternal Grandmother 49  . Breast cancer Paternal Aunt        dx in her 60s  . Cancer Maternal Grandmother        intra-abdominal cancer  . Brain cancer Maternal Uncle 8  . Brain cancer Cousin 62       maternal cousin  . Brain cancer Cousin 20       paternal cousin  . Colon cancer Neg Hx     No Known Allergies  Current Outpatient Medications on File Prior to Visit  Medication Sig Dispense Refill  . Calcium Carb-Cholecalciferol (CALCIUM-VITAMIN D) 500-400 MG-UNIT TABS Take by mouth 2 (two) times daily.     . cholecalciferol (VITAMIN D) 1000 units tablet Take 2,000 Units by mouth daily.    Marland Kitchen co-enzyme Q-10 30 MG capsule Take 300 mg by mouth daily.     . cyclobenzaprine (FLEXERIL) 10 MG tablet Take 1 tablet (10 mg total) by mouth 3 (three) times daily as needed for muscle spasms. 90 tablet 0  . diclofenac sodium (VOLTAREN) 1 % GEL as needed.     Marland Kitchen esomeprazole (NEXIUM) 40 MG capsule Take 1 capsule by mouth  daily 90 capsule 1  . levothyroxine (SYNTHROID, LEVOTHROID) 100 MCG tablet Take 1 tablet (100 mcg total) daily before breakfast by mouth. Take one pill in middle of night 90 tablet 3  . LUTEIN PO Take by mouth.    . Melatonin 3 MG CAPS Take by mouth at bedtime.    . meloxicam (MOBIC) 7.5 MG tablet Take 1 tablet (7.5 mg total) daily by mouth. 90 tablet 1  . methocarbamol (ROBAXIN) 500 MG tablet Take 500 mg by mouth every 6 (six) hours as needed for muscle spasms.    Marland Kitchen omega-3 acid ethyl esters (LOVAZA) 1 g capsule  Take 1 capsule (1 g total) 2 (two) times daily by mouth. 180 capsule 3  . oxyCODONE-acetaminophen (PERCOCET/ROXICET) 5-325 MG tablet Take 1 tablet by mouth every 8 (eight) hours as needed for severe pain. 8 tablet 0  . polyethylene glycol (MIRALAX / GLYCOLAX) packet Take 17 g by mouth daily.    . pregabalin (LYRICA) 75 MG capsule Take 1 capsule (75 mg total) by mouth 2 (two) times daily. 180 capsule 0  . saccharomyces boulardii (FLORASTOR) 250 MG capsule Take 250 mg by mouth daily.    . simvastatin (ZOCOR) 10 MG tablet TAKE 1 TABLET BY MOUTH AT BEDTIME 90 tablet 1  . tamoxifen (NOLVADEX) 20 MG tablet Take 1 tablet (20 mg total) daily by mouth. 90 tablet 4  . traMADol (ULTRAM) 50 MG tablet Take 1-2 tablets (50-100 mg total) by mouth every 6 (six) hours as needed (mild pain). 80 tablet 1  . TURMERIC PO Take 1,000 mg by mouth daily.    Marland Kitchen umeclidinium-vilanterol (ANORO ELLIPTA) 62.5-25 MCG/INH AEPB Inhale 1 puff into the lungs daily. 14 each 0  . umeclidinium-vilanterol (ANORO ELLIPTA) 62.5-25 MCG/INH AEPB Inhale 1 puff into the lungs daily. 60 each 6  . venlafaxine XR (EFFEXOR-XR) 150 MG 24 hr capsule Take 1 capsule (150 mg total) by mouth daily. 90 capsule 1   No current facility-administered medications on file prior to visit.     BP 124/80   Temp 98.1 F (  36.7 C) (Oral)   Wt 154 lb (69.9 kg)   BMI 31.10 kg/m       Objective:   Physical Exam  Constitutional: She is oriented to person, place, and time. She appears well-developed and well-nourished. No distress.  Cardiovascular: Normal rate, regular rhythm, normal heart sounds and intact distal pulses. Exam reveals no gallop and no friction rub.  No murmur heard. Pulmonary/Chest: Effort normal and breath sounds normal.  Musculoskeletal: She exhibits tenderness. She exhibits no edema or deformity.       Lumbar back: She exhibits decreased range of motion, tenderness and pain. She exhibits no bony tenderness, no edema and no spasm.        Back:  No spinal tenderness  Neurological: She is alert and oriented to person, place, and time.  Skin: Skin is warm and dry. She is not diaphoretic.  Nursing note and vitals reviewed.     Assessment & Plan:  1. Lumbar spine pain -Appears as more muscular strain.  Advised to take prescribed Tramadol when she gets home.  He can continue to use muscle relaxers every 3 hours.  Continue with heating pad.  Advised rest, no swimming over the next couple of days.  Will get a lower lumbar spine x-ray.  Follow-up if no improvement in the next 2 or 3 days or sooner if symptoms worsen - DG Lumbar Spine Complete; Future  [name], NP

## 2018-08-01 NOTE — Addendum Note (Signed)
Addended by: Apolinar Junes on: 08/01/2018 05:31 PM   Modules accepted: Orders

## 2018-08-02 ENCOUNTER — Encounter: Payer: Self-pay | Admitting: Adult Health

## 2018-08-03 NOTE — Telephone Encounter (Signed)
Please advise 

## 2018-08-11 ENCOUNTER — Encounter: Payer: Self-pay | Admitting: Adult Health

## 2018-08-11 ENCOUNTER — Other Ambulatory Visit: Payer: Self-pay | Admitting: Adult Health

## 2018-08-11 DIAGNOSIS — M545 Low back pain, unspecified: Secondary | ICD-10-CM

## 2018-08-11 DIAGNOSIS — I890 Lymphedema, not elsewhere classified: Secondary | ICD-10-CM

## 2018-08-15 ENCOUNTER — Other Ambulatory Visit: Payer: Self-pay | Admitting: Adult Health

## 2018-08-15 DIAGNOSIS — M545 Low back pain, unspecified: Secondary | ICD-10-CM

## 2018-08-15 MED ORDER — MELOXICAM 7.5 MG PO TABS: 7.5000 mg | ORAL_TABLET | Freq: Two times a day (BID) | ORAL | 1 refills | Status: DC | PRN

## 2018-08-24 ENCOUNTER — Other Ambulatory Visit: Payer: Self-pay

## 2018-08-24 ENCOUNTER — Ambulatory Visit: Payer: Medicare HMO | Attending: Oncology | Admitting: Physical Therapy

## 2018-08-24 DIAGNOSIS — M25511 Pain in right shoulder: Secondary | ICD-10-CM | POA: Insufficient documentation

## 2018-08-24 DIAGNOSIS — I972 Postmastectomy lymphedema syndrome: Secondary | ICD-10-CM | POA: Insufficient documentation

## 2018-08-24 DIAGNOSIS — R293 Abnormal posture: Secondary | ICD-10-CM | POA: Insufficient documentation

## 2018-08-24 DIAGNOSIS — M545 Low back pain, unspecified: Secondary | ICD-10-CM

## 2018-08-24 DIAGNOSIS — M5440 Lumbago with sciatica, unspecified side: Secondary | ICD-10-CM | POA: Insufficient documentation

## 2018-08-24 NOTE — Therapy (Signed)
[facility] [place], [place], [address] Phone: [phone]   Fax:  [phone]  Physical Therapy Evaluation  Patient Details  Name: Jean Nash MRN: [id number] Date of Birth: 1941/05/24 Referring Provider (PT): [name]   Encounter Date: 08/24/2018  PT End of Session - 08/24/18 1245    Visit Number  1    Number of Visits  17    Date for PT Re-Evaluation  10/24/18    PT Start Time  3419    PT Stop Time  1100    PT Time Calculation (min)  45 min    Activity Tolerance  Patient tolerated treatment well    Behavior During Therapy  [facility] for tasks assessed/performed       Past Medical History:  Diagnosis Date  . Anxiety    PHOBIAS  . Arthritis   . Breast cancer ([place]) 08/08/13   right LOQ  . Cataract   . Chronic insomnia   . Cluster headaches    history of migraines / NONE FOR SEVERAL YRS  . Depression   . Fibromyalgia   . GERD (gastroesophageal reflux disease)   . H/O hiatal hernia   . History of colonic polyps   . History of transfusion 08/30/2013  . Hx of radiation therapy 10/29/13- 12/14/13   right chest wall 5040 cGy 28 sessions, right supraclavicular/axillary region 5040 cGy 28 sessions, right chest wall boost 1000 cGy 5 sessions  . Hypothyroidism   . Irritable bowel syndrome   . Kidney stone   . Lymphedema    RT ARM - WEARS SLEEVE  . Macular degeneration    hole/right eye  . Osteopenia   . Other abnormal glucose   . Other and unspecified hyperlipidemia   . Pain in joint, shoulder region   . Pneumonia 3790,2409  . Sleep apnea    USES C-PAP  . Stress incontinence, female     Past Surgical History:  Procedure Laterality Date  . ABDOMINAL HYSTERECTOMY    . APPENDECTOMY    . BILATERAL TOTAL MASTECTOMY WITH AXILLARY LYMPH NODE DISSECTION  08/30/2013   Dr Barry Dienes  . BREAST CYST ASPIRATION     9 cysts  . CATARACT EXTRACTION, BILATERAL  2005/2007  . CHOLECYSTECTOMY    . COLONOSCOPY     . EVACUATION BREAST HEMATOMA Left 08/31/2013   Procedure: EVACUATION HEMATOMA BREAST;  Surgeon: Stark Klein, MD;  Location: [facility];  Service: General;  Laterality: Left;  . EYE SURGERY     to repair macular hole  . FOOT ARTHROPLASTY     lt   . GANGLION CYST EXCISION     rt foot  . HEMORRHOID SURGERY     03/1993  . JOINT REPLACEMENT  03/15/11   left knee replacement  . KNEE ARTHROSCOPY     /partial knee 2016/left knee 2012  . MASS EXCISION  11/04/2011   Procedure: EXCISION MASS;  Surgeon: Cammie Sickle., MD;  Location: [facility];  Service: Orthopedics;  Laterality: Right;  excisional biopsy right ulna mass  . MASTECTOMY W/ SENTINEL NODE BIOPSY Right 08/30/2013   Procedure: RIGHT  AXILLARY SENTINEL LYMPH NODE BIOPSY; Right Axillary Node Disection;  Surgeon: Stark Klein, MD;  Location: [facility];  Service: General;  Laterality: Right;  Right side nuc med 7:00   . PARTIAL KNEE ARTHROPLASTY Right 11/03/2015   Procedure: RIGHT KNEE MEDIAL UNICOMPARTMENTAL ARTHROPLASTY ;  Surgeon: Gaynelle Arabian, MD;  [facility];  Service: Orthopedics;  Laterality: Right;  .  SIMPLE MASTECTOMY WITH AXILLARY SENTINEL NODE BIOPSY Left 08/30/2013   Procedure: Bilateral Breast Mastectomy ;  Surgeon: Stark Klein, MD;  Location: [facility];  Service: General;  Laterality: Left;  . skin tags removed     breast, panty line, neckline  . TOE SURGERY     preventative crossover toe surg/right foot  . TOE SURGERY  2009   left foot/screw  in 2nd toe  . TONSILLECTOMY    . UPPER GASTROINTESTINAL ENDOSCOPY      There were no vitals filed for this visit.   Subjective Assessment - 08/24/18 1029    Subjective  Pt comes back to PT as her arm is still swollen.  She is wearing the Juzo wrap garment and wears the compressio sleeve when not in the wrap.  She does not wear anything on her hand.  .  She has also been having pain in her right chest and arm.  She has recently diagnosed with back facet hypertrophy  so has increased her pain med for her back  Pt says that she gets random episodes of sharp pain in right chest and axilla.     Pertinent History  She had a bilateral mastectomy with a right axillary node dissection removing 16 lymph nodes and 9 of those were positive.  Currently taking tamoxifen, Bil TKR    Patient Stated Goals  to get manual lymph drainage to help with the swelling     Currently in Pain?  No/denies         [facility] PT Assessment - 08/24/18 0001      Assessment   Medical Diagnosis  Rt breast cancer with bil mastectomy     Referring Provider (PT)  [name]    Onset Date/Surgical Date  08/30/13    Hand Dominance  Right    Prior Therapy  yes      Precautions   Precaution Comments  cancer      Restrictions   Weight Bearing Restrictions  No      Balance Screen   Has the patient fallen in the past 6 months  No    Has the patient had a decrease in activity level because of a fear of falling?   No    Is the patient reluctant to leave their home because of a fear of falling?   No      Home Film/video editor residence    Living Arrangements  Spouse/significant other    Home Access  Ramped entrance    [place]  None      Prior Function   Level of [place]  Retired    Leisure  wants to start exercing on the bike and just joined the [employer]   Overall Cognitive Status  Within Functional Limits for tasks assessed      Observation/Other Assessments   Observations  pt comes in wearing juzo velcro wrap on right arm, nothing on hand.  She has marks on her arm when it is removed and has visible swelling above ther juzo.     Other Surveys   Other Surveys   Lymphedema Life Impact Scale: 27.94     Coordination   Gross Motor Movements are Fluid and Coordinated  Yes      Posture/Postural Control   Posture/Postural Control  Postural limitations    Postural Limitations  Rounded  Shoulders;Forward head;Decreased lumbar lordosis;Increased thoracic kyphosis    Posture Comments  Pt says she has always had forward shoulders and sits forward       AROM   Overall AROM Comments  Pain in right axilla that really pulls when trying to get end range of shoulder range of motion of right shoulder  decreased stabalization of right scapula with elevation of  right arm     AROM Assessment Site  Shoulder      Strength   Overall Strength Comments  Pt appears to have decreased generalized muscle tone       Palpation   Palpation comment  very tight tender area in right axilla at medial border of scapulae  chest scars are mobile         LYMPHEDEMA/ONCOLOGY QUESTIONNAIRE - 08/24/18 1216      Type   Cancer Type  right breast cancer      Surgeries   Mastectomy Date  08/30/13    Axillary Lymph Node Dissection Date  08/30/13    Number Lymph Nodes Removed  16   9 pos     Date Lymphedema/Swelling Started   Date  03/15/18      Treatment   Active Chemotherapy Treatment  No    Past Chemotherapy Treatment  No    Active Radiation Treatment  No    Past Radiation Treatment  Yes    Current Hormone Treatment  Yes    Date  04/16/15    Drug Name  Tamoxifen      What other symptoms do you have   Are you Having Heaviness or Tightness  Yes    Do you have infections  No      Lymphedema Assessments   Lymphedema Assessments  Upper extremities      Right Upper Extremity Lymphedema   15 cm Proximal to Olecranon Process  37 cm    10 cm Proximal to Olecranon Process  35.3 cm    Olecranon Process  26.4 cm    15 cm Proximal to Ulnar Styloid Process  24 cm    10 cm Proximal to Ulnar Styloid Process  21.5 cm    Just Proximal to Ulnar Styloid Process  17 cm    Across Hand at [employer]  19 cm    At [place] of 2nd Digit  --   ring inthe way and stuck   At [facility] of Thumb  6.4 cm      Left Upper Extremity Lymphedema   15 cm Proximal to Olecranon Process  35 cm    10 cm Proximal to  Olecranon Process  33 cm    Olecranon Process  25.3 cm    15 cm Proximal to Ulnar Styloid Process  23.8 cm    10 cm Proximal to Ulnar Styloid Process  21.7 cm    Just Proximal to Ulnar Styloid Process  16.3 cm    Across Hand at [employer]  18.5 cm    At [place] of 2nd Digit  6.2 cm             Outpatient Rehab from 08/24/2018 [facility]  Lymphedema Life Impact Scale Total Score  27.94 %      Objective measurements completed on examination: See above findings.                PT Short Term Goals - 08/24/18 1956      PT SHORT TERM GOAL #1   Title  Pt will obtain appropriate compression garment for the Rt UE and will be knowledgeable about use including hand piece and compression glove    Time  4    Period  Weeks    Status  New      PT SHORT TERM GOAL #2   Title  Pt will reports incidents of "grabbing" right shoulder pain are decreased by 50%    Time  4    Period  Weeks      PT SHORT TERM GOAL #3   Title  Pt reports she is able to manage her lymphedema independently at home    Time  8    Period  Weeks    Status  New        PT Long Term Goals - 08/24/18 1955      PT LONG TERM GOAL #1   Title  pt will be independent in a strengthening exercise program     Time  8    Period  Weeks    Status  New      PT LONG TERM GOAL #2   Title  pt will report the incidents of right shoulder pain are decreased by 75%    Time  8    Period  Weeks    Status  New      PT LONG TERM GOAL #3   Title  Pt will report her back pain has decreased by 50%    Time  8    Period  Weeks    Status  New             Plan - 08/24/18 1246    Clinical Impression Statement  Pt comes back to PT as she has been having pain in her right chest/axilla and swelling in her right arm.  She has been wearing her velcro garment and her lymphedema seems to be contained although there is fullness in the upper arm above the garment.  She has a tight  trigger point near the subscapularis muscle. She has new diagnosis of back facet hypertrophy and c/o back pain also. She wants to increase her muscle strength and find more things to help manage her swelling. Feel she would benefit from a compression band to add to top of velcro garment, compression glove to wear with her daytime garment, a hand piece to wear with her juzo wrap and possbile a Veva Holes  garment with axillary pads.   Will send prescription for same.  may benefit from dry needling at some point so will add to plan of care     Clinical Presentation  Stable    Clinical Decision Making  Low    Rehab Potential  Good    PT Frequency  2x / week    PT Duration  8 weeks   decrease to one time a week when able    PT Treatment/Interventions  ADLs/Self Care Home Management;DME Instruction;Therapeutic exercise;Therapeutic activities;Manual techniques;Manual lymph drainage;Compression bandaging;Passive range of motion;Dry needling;Taping;Patient/family education;Orthotic Fit/Training    PT Next Visit Plan  add extra strap to top of juzo wrap , see if script is back.  Manual work to tight trigger points , postural exercise, strengthening to scapular area. MLD as needed     Consulted and Agree with Plan of Care  Patient       Patient will benefit from skilled therapeutic intervention in order to improve the following deficits and impairments:  Pain, Increased edema, Increased fascial restricitons, Postural dysfunction, Decreased strength,  Obesity, Impaired perceived functional ability, Increased muscle spasms  Visit Diagnosis: Postmastectomy lymphedema - Plan: PT plan of care cert/re-cert  Abnormal posture - Plan: PT plan of care cert/re-cert  Acute pain of right shoulder - Plan: PT plan of care cert/re-cert  Acute midline low back pain without sciatica - Plan: PT plan of care cert/re-cert     Problem List Patient Active Problem List   Diagnosis Date Noted  . MDD (major depressive  disorder), recurrent episode, mild ([place]) 12/22/2017  . Osteoporosis 07/14/2017  . OA (osteoarthritis) of knee 11/03/2015  . Osteopenia 08/08/2015  . Obstructive sleep apnea 06/25/2015  . Headache 02/03/2015  . Atypical chest pain 07/15/2014  . Arthralgia 02/22/2014  . Psoriasis 02/22/2014  . Malignant neoplasm of lower-outer quadrant of right breast of female, estrogen receptor positive ([place]) 08/09/2013  . Neck pain 06/22/2013  . Left knee pain 11/29/2012  . Reactive depression (situational) 04/26/2012  . Right wrist pain 02/02/2012  . Right foot pain 10/04/2011  . Nevus 06/05/2011  . Status post total knee replacement 04/06/2011  . Overactive bladder 03/14/2011  . KNEE PAIN, LEFT 08/31/2010  . HEARING LOSS 08/17/2010  . HIRSUTISM 06/16/2009  . CYST, IDIOPATHIC 05/20/2008  . IRRITABLE BOWEL SYNDROME 04/15/2008  . Allergic asthma, mild intermittent, uncomplicated 34/19/6222  . GERD 03/19/2008  . COLONIC POLYPS, HX OF 03/19/2008  . NEPHROLITHIASIS, HX OF 03/19/2008  . Hypothyroidism 03/18/2008  . Hyperlipidemia 03/18/2008  . INSOMNIA, CHRONIC 03/18/2008  . FIBROMYALGIA 03/18/2008  . Prediabetes 03/18/2008   Donato Heinz. Owens Shark PT  [place] 08/24/2018, [place], [place], [address] Phone: [phone]   Fax:  [phone]  Name: Marjarie Irion MRN: [id number] Date of Birth: [birth year]

## 2018-08-31 ENCOUNTER — Ambulatory Visit: Payer: Medicare HMO | Admitting: Physical Therapy

## 2018-08-31 ENCOUNTER — Encounter: Payer: Self-pay | Admitting: Physical Therapy

## 2018-08-31 DIAGNOSIS — M545 Low back pain, unspecified: Secondary | ICD-10-CM

## 2018-08-31 DIAGNOSIS — M5440 Lumbago with sciatica, unspecified side: Secondary | ICD-10-CM | POA: Diagnosis not present

## 2018-08-31 DIAGNOSIS — I972 Postmastectomy lymphedema syndrome: Secondary | ICD-10-CM | POA: Diagnosis not present

## 2018-08-31 DIAGNOSIS — R293 Abnormal posture: Secondary | ICD-10-CM | POA: Diagnosis not present

## 2018-08-31 DIAGNOSIS — M25511 Pain in right shoulder: Secondary | ICD-10-CM | POA: Diagnosis not present

## 2018-08-31 NOTE — Therapy (Signed)
[place], [place], [address] Phone: [phone]   Fax:  [phone]  Physical Therapy Treatment  Patient Details  Name: Jean Nash MRN: [id number] Date of Birth: 1941-04-07 Referring Provider (PT): [name]   Encounter Date: 08/31/2018  PT End of Session - 08/31/18 1102    Visit Number  2    Number of Visits  17    Date for PT Re-Evaluation  10/24/18    PT Start Time  4970    PT Stop Time  1102    PT Time Calculation (min)  47 min       Past Medical History:  Diagnosis Date  . Anxiety    PHOBIAS  . Arthritis   . Breast cancer ([place]) 08/08/13   right LOQ  . Cataract   . Chronic insomnia   . Cluster headaches    history of migraines / NONE FOR SEVERAL YRS  . Depression   . Fibromyalgia   . GERD (gastroesophageal reflux disease)   . H/O hiatal hernia   . History of colonic polyps   . History of transfusion 08/30/2013  . Hx of radiation therapy 10/29/13- 12/14/13   right chest wall 5040 cGy 28 sessions, right supraclavicular/axillary region 5040 cGy 28 sessions, right chest wall boost 1000 cGy 5 sessions  . Hypothyroidism   . Irritable bowel syndrome   . Kidney stone   . Lymphedema    RT ARM - WEARS SLEEVE  . Macular degeneration    hole/right eye  . Osteopenia   . Other abnormal glucose   . Other and unspecified hyperlipidemia   . Pain in joint, shoulder region   . Pneumonia 2637,8588  . Sleep apnea    USES C-PAP  . Stress incontinence, female     Past Surgical History:  Procedure Laterality Date  . ABDOMINAL HYSTERECTOMY    . APPENDECTOMY    . BILATERAL TOTAL MASTECTOMY WITH AXILLARY LYMPH NODE DISSECTION  08/30/2013   Dr Barry Dienes  . BREAST CYST ASPIRATION     9 cysts  . CATARACT EXTRACTION, BILATERAL  2005/2007  . CHOLECYSTECTOMY    . COLONOSCOPY    . EVACUATION BREAST HEMATOMA Left 08/31/2013   Procedure: EVACUATION HEMATOMA BREAST;  Surgeon: Stark Klein, MD;   Location: [facility];  Service: General;  Laterality: Left;  . EYE SURGERY     to repair macular hole  . FOOT ARTHROPLASTY     lt   . GANGLION CYST EXCISION     rt foot  . HEMORRHOID SURGERY     03/1993  . JOINT REPLACEMENT  03/15/11   left knee replacement  . KNEE ARTHROSCOPY     /partial knee 2016/left knee 2012  . MASS EXCISION  11/04/2011   Procedure: EXCISION MASS;  Surgeon: Cammie Sickle., MD;  Location: [facility];  Service: Orthopedics;  Laterality: Right;  excisional biopsy right ulna mass  . MASTECTOMY W/ SENTINEL NODE BIOPSY Right 08/30/2013   Procedure: RIGHT  AXILLARY SENTINEL LYMPH NODE BIOPSY; Right Axillary Node Disection;  Surgeon: Stark Klein, MD;  Location: [facility];  Service: General;  Laterality: Right;  Right side nuc med 7:00   . PARTIAL KNEE ARTHROPLASTY Right 11/03/2015   Procedure: RIGHT KNEE MEDIAL UNICOMPARTMENTAL ARTHROPLASTY ;  Surgeon: Gaynelle Arabian, MD;  Location: [facility];  Service: Orthopedics;  Laterality: Right;  . SIMPLE MASTECTOMY WITH AXILLARY SENTINEL NODE BIOPSY Left 08/30/2013   Procedure: Bilateral Breast Mastectomy ;  Surgeon: Dorris Fetch  Barry Dienes, MD;  Location: [facility];  Service: General;  Laterality: Left;  . skin tags removed     breast, panty line, neckline  . TOE SURGERY     preventative crossover toe surg/right foot  . TOE SURGERY  2009   left foot/screw  in 2nd toe  . TONSILLECTOMY    . UPPER GASTROINTESTINAL ENDOSCOPY      There were no vitals filed for this visit.  Subjective Assessment - 08/31/18 1027    Subjective  Pt had periodontal surgery last week and will have to go back to get her stitches out today .  She is doing better today. She has been wearing the juzo more of the time now since the weather is cooler     Pertinent History  She had a bilateral mastectomy with a right axillary node dissection removing 16 lymph nodes and 9 of those were positive.  Currently taking tamoxifen, Bil TKR    Patient Stated Goals  to get  manual lymph drainage to help with the swelling     Currently in Pain?  No/denies                  Outpatient Re[facility]/2018 [facility]  Lymphedema Life Impact Scale Total Score  27.94 %           [facility] Adult PT Treatment/Exercise - 08/31/18 0001      Exercises   Exercises  Lumbar      Lumbar Exercises: Stretches   Other Lumbar Stretch Exercise  meeks decompression     Other Lumbar Stretch Exercise  pelvic tilts, single knee to chest 4 count tabletop, table top hold x 10       Manual Therapy   Manual Therapy  Soft tissue mobilization;Myofascial release    Soft tissue mobilization  with thick cream in left sidelying to right back, neck and scapular and axillary area     Myofascial Release  deep pressure to trigger points for release                PT Short Term Goals - 08/24/18 1956      PT SHORT TERM GOAL #1   Title  Pt will obtain appropriate compression garment for the Rt UE and will be knowledgeable about use including hand piece and compression glove    Time  4    Period  Weeks    Status  New      PT SHORT TERM GOAL #2   Title  Pt will reports incidents of "grabbing" right shoulder pain are decreased by 50%    Time  4    Period  Weeks      PT SHORT TERM GOAL #3   Title  Pt reports she is able to manage her lymphedema independently at home    Time  8    Period  Weeks    Status  New        PT Long Term Goals - 08/24/18 1955      PT LONG TERM GOAL #1   Title  pt will be independent in a strengthening exercise program     Time  8    Period  Weeks    Status  New      PT LONG TERM GOAL #2   Title  pt will report the incidents of right shoulder pain are decreased by 75%    Time  8    Period  Weeks  Status  New      PT LONG TERM GOAL #3   Title  Pt will report her back pain has decreased by 50%    Time  8    Period  Weeks    Status  New            Plan - 08/31/18 1102     Clinical Impression Statement  Pt tolerated treatment well today and feels more relaxed after session.  They will go to [facility] to see about garments     Clinical Impairments Affecting Rehab Potential  will not do bandaging     PT Frequency  2x / week    PT Duration  8 weeks    PT Next Visit Plan    see how pt did after treatment today add extra strap to top of juzo wrap , see if script is back.  Manual work to tight trigger points , postural exercise, strengthening to scapular area. MLD as needed        Patient will benefit from skilled therapeutic intervention in order to improve the following deficits and impairments:  Pain, Increased edema, Increased fascial restricitons, Postural dysfunction, Decreased strength, Obesity, Impaired perceived functional ability, Increased muscle spasms  Visit Diagnosis: Postmastectomy lymphedema  Abnormal posture  Acute pain of right shoulder  Acute midline low back pain without sciatica     Problem List Patient Active Problem List   Diagnosis Date Noted  . MDD (major depressive disorder), recurrent episode, mild ([place]) 12/22/2017  . Osteoporosis 07/14/2017  . OA (osteoarthritis) of knee 11/03/2015  . Osteopenia 08/08/2015  . Obstructive sleep apnea 06/25/2015  . Headache 02/03/2015  . Atypical chest pain 07/15/2014  . Arthralgia 02/22/2014  . Psoriasis 02/22/2014  . Malignant neoplasm of lower-outer quadrant of right breast of female, estrogen receptor positive ([place]) 08/09/2013  . Neck pain 06/22/2013  . Left knee pain 11/29/2012  . Reactive depression (situational) 04/26/2012  . Right wrist pain 02/02/2012  . Right foot pain 10/04/2011  . Nevus 06/05/2011  . Status post total knee replacement 04/06/2011  . Overactive bladder 03/14/2011  . KNEE PAIN, LEFT 08/31/2010  . HEARING LOSS 08/17/2010  . HIRSUTISM 06/16/2009  . CYST, IDIOPATHIC 05/20/2008  . IRRITABLE BOWEL SYNDROME 04/15/2008  . Allergic asthma, mild intermittent,  uncomplicated 47/42/5956  . GERD 03/19/2008  . COLONIC POLYPS, HX OF 03/19/2008  . NEPHROLITHIASIS, HX OF 03/19/2008  . Hypothyroidism 03/18/2008  . Hyperlipidemia 03/18/2008  . INSOMNIA, CHRONIC 03/18/2008  . FIBROMYALGIA 03/18/2008  . Prediabetes 03/18/2008   Donato Heinz. Owens Shark PT  Norwood Levo 08/31/2018, 11:06 AM  [place], [place], [address] Phone: [phone]   Fax:  [phone]  Name: Jean Nash MRN: [id number] Date of Birth: December 10, 1940

## 2018-08-31 NOTE — Patient Instructions (Signed)

## 2018-09-06 ENCOUNTER — Ambulatory Visit: Payer: Medicare HMO | Admitting: Physical Therapy

## 2018-09-06 ENCOUNTER — Encounter: Payer: Self-pay | Admitting: Physical Therapy

## 2018-09-06 DIAGNOSIS — M25511 Pain in right shoulder: Secondary | ICD-10-CM | POA: Diagnosis not present

## 2018-09-06 DIAGNOSIS — M545 Low back pain, unspecified: Secondary | ICD-10-CM

## 2018-09-06 DIAGNOSIS — M5136 Other intervertebral disc degeneration, lumbar region: Secondary | ICD-10-CM | POA: Diagnosis not present

## 2018-09-06 DIAGNOSIS — M5440 Lumbago with sciatica, unspecified side: Secondary | ICD-10-CM | POA: Diagnosis not present

## 2018-09-06 DIAGNOSIS — R293 Abnormal posture: Secondary | ICD-10-CM

## 2018-09-06 DIAGNOSIS — I972 Postmastectomy lymphedema syndrome: Secondary | ICD-10-CM

## 2018-09-06 DIAGNOSIS — M4310 Spondylolisthesis, site unspecified: Secondary | ICD-10-CM | POA: Diagnosis not present

## 2018-09-06 DIAGNOSIS — M48061 Spinal stenosis, lumbar region without neurogenic claudication: Secondary | ICD-10-CM | POA: Diagnosis not present

## 2018-09-06 NOTE — Therapy (Signed)
[place], [place], [address] Phone: [phone]   Fax:  [phone]  Physical Therapy Treatment  Patient Details  Name: Jean Nash MRN: [id number] Date of Birth: [birth year] Referring Provider (PT): [name]   Encounter Date: 09/06/2018  PT End of Session - 09/06/18 1641    Visit Number  2    Number of Visits  17    Date for PT Re-Evaluation  10/24/18    PT Start Time  1638    PT Stop Time  1430    PT Time Calculation (min)  45 min       Past Medical History:  Diagnosis Date  . Anxiety    PHOBIAS  . Arthritis   . Breast cancer ([place]) 08/08/13   right LOQ  . Cataract   . Chronic insomnia   . Cluster headaches    history of migraines / NONE FOR SEVERAL YRS  . Depression   . Fibromyalgia   . GERD (gastroesophageal reflux disease)   . H/O hiatal hernia   . History of colonic polyps   . History of transfusion 08/30/2013  . Hx of radiation therapy 10/29/13- 12/14/13   right chest wall 5040 cGy 28 sessions, right supraclavicular/axillary region 5040 cGy 28 sessions, right chest wall boost 1000 cGy 5 sessions  . Hypothyroidism   . Irritable bowel syndrome   . Kidney stone   . Lymphedema    RT ARM - WEARS SLEEVE  . Macular degeneration    hole/right eye  . Osteopenia   . Other abnormal glucose   . Other and unspecified hyperlipidemia   . Pain in joint, shoulder region   . Pneumonia 4665,9935  . Sleep apnea    USES C-PAP  . Stress incontinence, female     Past Surgical History:  Procedure Laterality Date  . ABDOMINAL HYSTERECTOMY    . APPENDECTOMY    . BILATERAL TOTAL MASTECTOMY WITH AXILLARY LYMPH NODE DISSECTION  08/30/2013   Dr Barry Dienes  . BREAST CYST ASPIRATION     9 cysts  . CATARACT EXTRACTION, BILATERAL  2005/2007  . CHOLECYSTECTOMY    . COLONOSCOPY    . EVACUATION BREAST HEMATOMA Left 08/31/2013   Procedure: EVACUATION HEMATOMA BREAST;  Surgeon: Stark Klein, MD;   Location: [facility];  Service: General;  Laterality: Left;  . EYE SURGERY     to repair macular hole  . FOOT ARTHROPLASTY     lt   . GANGLION CYST EXCISION     rt foot  . HEMORRHOID SURGERY     03/1993  . JOINT REPLACEMENT  03/15/11   left knee replacement  . KNEE ARTHROSCOPY     /partial knee 2016/left knee 2012  . MASS EXCISION  11/04/2011   Procedure: EXCISION MASS;  Surgeon: Cammie Sickle., MD;  Location: [facility];  Service: Orthopedics;  Laterality: Right;  excisional biopsy right ulna mass  . MASTECTOMY W/ SENTINEL NODE BIOPSY Right 08/30/2013   Procedure: RIGHT  AXILLARY SENTINEL LYMPH NODE BIOPSY; Right Axillary Node Disection;  Surgeon: Stark Klein, MD;  Location: [facility];  Service: General;  Laterality: Right;  Right side nuc med 7:00   . PARTIAL KNEE ARTHROPLASTY Right 11/03/2015   Procedure: RIGHT KNEE MEDIAL UNICOMPARTMENTAL ARTHROPLASTY ;  Surgeon: Gaynelle Arabian, MD;  Location[facility];  Service: Orthopedics;  Laterality: Right;  . SIMPLE MASTECTOMY WITH AXILLARY SENTINEL NODE BIOPSY Left 08/30/2013   Procedure: Bilateral Breast Mastectomy ;  Surgeon: Dorris Fetch  Barry Dienes, MD;  Location: [facility];  Service: General;  Laterality: Left;  . skin tags removed     breast, panty line, neckline  . TOE SURGERY     preventative crossover toe surg/right foot  . TOE SURGERY  2009   left foot/screw  in 2nd toe  . TONSILLECTOMY    . UPPER GASTROINTESTINAL ENDOSCOPY      There were no vitals filed for this visit.  Subjective Assessment - 09/06/18 1638    Subjective  Pt states she hurts all over today.  She is going to see Dr. Tonita Cong about her back today Pt is interested in finding out about coverage for the Flexitouch     Pertinent History  She had a bilateral mastectomy with a right axillary node dissection removing 16 lymph nodes and 9 of those were positive.  Currently taking tamoxifen, Bil TKR    Patient Stated Goals  to get manual lymph drainage to help with the swelling      Currently in Pain?  Yes    Pain Score  7     Pain Location  --   all over                  Outpatient Rehab from 08/24/2018 in [facility]  Lymphedema Life Impact Scale Total Score  27.94 %           [facility] Adult PT Treatment/Exercise - 09/06/18 0001      Exercises   Exercises  Neck;Shoulder      Neck Exercises: Seated   Other Seated Exercise  neck ROM and backward shoulder rolls       Shoulder Exercises: Seated   Other Seated Exercises  arms in "T" postitoin and ROM on end range to internal and external ROM       Shoulder Exercises: Standing   Other Standing Exercises  pinkys up the wall on foam roller , then from "W" position, elbows back 10 reps on either side       Manual Therapy   Manual Therapy  Soft tissue mobilization;Myofascial release    Soft tissue mobilization  with thick cream in left sidelying to right back, neck and scapular and axillary area     Myofascial Release  deep pressure to trigger points for release                PT Short Term Goals - 08/24/18 1956      PT SHORT TERM GOAL #1   Title  Pt will obtain appropriate compression garment for the Rt UE and will be knowledgeable about use including hand piece and compression glove    Time  4    Period  Weeks    Status  New      PT SHORT TERM GOAL #2   Title  Pt will reports incidents of "grabbing" right shoulder pain are decreased by 50%    Time  4    Period  Weeks      PT SHORT TERM GOAL #3   Title  Pt reports she is able to manage her lymphedema independently at home    Time  8    Period  Weeks    Status  New        PT Long Term Goals - 08/24/18 1955      PT LONG TERM GOAL #1   Title  pt will be independent in a strengthening exercise program     Time  8  Period  Weeks    Status  New      PT LONG TERM GOAL #2   Title  pt will report the incidents of right shoulder pain are decreased by 75%    Time  8    Period  Weeks     Status  New      PT LONG TERM GOAL #3   Title  Pt will report her back pain has decreased by 50%    Time  8    Period  Weeks    Status  New            Plan - 09/06/18 1642    Clinical Impression Statement  Pt with mulitple tender trigger points in right neck, interscapular area, posterior axilla and chest that literally made pt jump, that were released with soft tissue work.  She said she had no pain when she left. She got measured for compression garments today and is interested in finding out more about the Flexi, brochure given and will send demographics     Rehab Potential  Good    Clinical Impairments Affecting Rehab Potential  will not do bandaging     PT Treatment/Interventions  ADLs/Self Care Home Management;DME Instruction;Therapeutic exercise;Therapeutic activities;Manual techniques;Manual lymph drainage;Compression bandaging;Passive range of motion;Dry needling;Taping;Patient/family education;Orthotic Fit/Training    PT Next Visit Plan   continue exercise and Manual work to tight trigger points , postural exercise, strengthening to scapular area. MLD as needed     Consulted and Agree with Plan of Care  Patient       Patient will benefit from skilled therapeutic intervention in order to improve the following deficits and impairments:  Pain, Increased edema, Increased fascial restricitons, Postural dysfunction, Decreased strength, Obesity, Impaired perceived functional ability, Increased muscle spasms  Visit Diagnosis: Postmastectomy lymphedema  Abnormal posture  Acute pain of right shoulder  Acute midline low back pain without sciatica     Problem List Patient Active Problem List   Diagnosis Date Noted  . MDD (major depressive disorder), recurrent episode, mild ([place]) 12/22/2017  . Osteoporosis 07/14/2017  . OA (osteoarthritis) of knee 11/03/2015  . Osteopenia 08/08/2015  . Obstructive sleep apnea 06/25/2015  . Headache 02/03/2015  . Atypical chest pain  07/15/2014  . Arthralgia 02/22/2014  . Psoriasis 02/22/2014  . Malignant neoplasm of lower-outer quadrant of right breast of female, estrogen receptor positive ([place]) 08/09/2013  . Neck pain 06/22/2013  . Left knee pain 11/29/2012  . Reactive depression (situational) 04/26/2012  . Right wrist pain 02/02/2012  . Right foot pain 10/04/2011  . Nevus 06/05/2011  . Status post total knee replacement 04/06/2011  . Overactive bladder 03/14/2011  . KNEE PAIN, LEFT 08/31/2010  . HEARING LOSS 08/17/2010  . HIRSUTISM 06/16/2009  . CYST, IDIOPATHIC 05/20/2008  . IRRITABLE BOWEL SYNDROME 04/15/2008  . Allergic asthma, mild intermittent, uncomplicated 44/81/8563  . GERD 03/19/2008  . COLONIC POLYPS, HX OF 03/19/2008  . NEPHROLITHIASIS, HX OF 03/19/2008  . Hypothyroidism 03/18/2008  . Hyperlipidemia 03/18/2008  . INSOMNIA, CHRONIC 03/18/2008  . FIBROMYALGIA 03/18/2008  . Prediabetes 03/18/2008   Jean Nash PT  Jean Nash 09/06/2018, 4:45 PM  [place], [place], [address] Phone: [phone]   Fax:  [phone]  Name: Jean Nash MRN: [id number] Date of Birth: 22-Sep-1941

## 2018-09-06 NOTE — Telephone Encounter (Signed)
Patient Instructions by Deneise Lever, MD at 06/01/2018 10:00 AM  Author: Deneise Lever, MD Author Type: Physician Filed: 06/01/2018 10:31 AM  Note Status: Signed Cosign: Cosign Not Required Encounter Date: 06/01/2018  Editor: Deneise Lever, MD (Physician)    Sample Anoro inhaler    Inhale 1 puff, once daily    See if this helps the cough  We will see where you are in 6 months with the sleep apnea as you lose weight, and also with your cough.  Call sooner as needed.      Dr. Annamaria Boots pt is referring to anoro.

## 2018-09-06 NOTE — Telephone Encounter (Signed)
Is Anoro the med that is too expensive?  See if Rx Arnuity would be enough cheaper    # 1, inhale 1 puff daily, ref x 12

## 2018-09-11 NOTE — Progress Notes (Signed)
[facility] MD/PA/NP OP Progress Note  09/13/2018 10:58 AM Jean Nash  MRN:  [id number]  Chief Complaint:  Chief Complaint    Depression; Follow-up     HPI:  Patient presents for follow-up appointment for depression.  She states that she feels fantastic.  However, she reports a few hours of depression yesterday.  She had incontinence, and her husband cleaned them up.  She feels guilty that her husband did it.  She reports good relationship with her husband, stating that "he has to outlive me." She had a visit from her son in [place].  She enjoys family gathering.  She also feels pleased that she will be able to get the machine for her arm edema for free.  She sleeps 4 to 6 hours.  She had insomnia last night.  She has good energy and motivation.  She has good concentration.  She denies SI.  She denies anxiety or panic attacks.   Visit Diagnosis:    ICD-10-CM   1. Recurrent major depressive disorder, in partial remission ([place]) F33.41     Past Psychiatric History: Please see initial evaluation for full details. I have reviewed the history. No updates at this time.     Past Medical History:  Past Medical History:  Diagnosis Date  . Anxiety    PHOBIAS  . Arthritis   . Breast cancer ([place]) 08/08/13   right LOQ  . Cataract   . Chronic insomnia   . Cluster headaches    history of migraines / NONE FOR SEVERAL YRS  . Depression   . Fibromyalgia   . GERD (gastroesophageal reflux disease)   . H/O hiatal hernia   . History of colonic polyps   . History of transfusion 08/30/2013  . Hx of radiation therapy 10/29/13- 12/14/13   right chest wall 5040 cGy 28 sessions, right supraclavicular/axillary region 5040 cGy 28 sessions, right chest wall boost 1000 cGy 5 sessions  . Hypothyroidism   . Irritable bowel syndrome   . Kidney stone   . Lymphedema    RT ARM - WEARS SLEEVE  . Macular degeneration    hole/right eye  . Osteopenia   . Other abnormal glucose   . Other and unspecified  hyperlipidemia   . Pain in joint, shoulder region   . Pneumonia 2440,1027  . Sleep apnea    USES C-PAP  . Stress incontinence, female     Past Surgical History:  Procedure Laterality Date  . ABDOMINAL HYSTERECTOMY    . APPENDECTOMY    . BILATERAL TOTAL MASTECTOMY WITH AXILLARY LYMPH NODE DISSECTION  08/30/2013   Dr Barry Dienes  . BREAST CYST ASPIRATION     9 cysts  . CATARACT EXTRACTION, BILATERAL  2005/2007  . CHOLECYSTECTOMY    . COLONOSCOPY    . EVACUATION BREAST HEMATOMA Left 08/31/2013   Procedure: EVACUATION HEMATOMA BREAST;  Surgeon: Stark Klein, MD;  Location: [facility];  Service: General;  Laterality: Left;  . EYE SURGERY     to repair macular hole  . FOOT ARTHROPLASTY     lt   . GANGLION CYST EXCISION     rt foot  . HEMORRHOID SURGERY     03/1993  . JOINT REPLACEMENT  03/15/11   left knee replacement  . KNEE ARTHROSCOPY     /partial knee 2016/left knee 2012  . MASS EXCISION  11/04/2011   Procedure: EXCISION MASS;  Surgeon: Cammie Sickle., MD;  Location: [facility];  Service: Orthopedics;  Laterality: Right;  excisional  biopsy right ulna mass  . MASTECTOMY W/ SENTINEL NODE BIOPSY Right 08/30/2013   Procedure: RIGHT  AXILLARY SENTINEL LYMPH NODE BIOPSY; Right Axillary Node Disection;  Surgeon: Stark Klein, MD;  Location: [facility];  Service: General;  Laterality: Right;  Right side nuc med 7:00   . PARTIAL KNEE ARTHROPLASTY Right 11/03/2015   Procedure: RIGHT KNEE MEDIAL UNICOMPARTMENTAL ARTHROPLASTY ;  Surgeon: Gaynelle Arabian, MD;  Location: [facility];  Service: Orthopedics;  Laterality: Right;  . SIMPLE MASTECTOMY WITH AXILLARY SENTINEL NODE BIOPSY Left 08/30/2013   Procedure: Bilateral Breast Mastectomy ;  Surgeon: Stark Klein, MD;  Location: [facility];  Service: General;  Laterality: Left;  . skin tags removed     breast, panty line, neckline  . TOE SURGERY     preventative crossover toe surg/right foot  . TOE SURGERY  2009   left foot/screw  in 2nd toe  .  TONSILLECTOMY    . UPPER GASTROINTESTINAL ENDOSCOPY      Family Psychiatric History: Please see initial evaluation for full details. I have reviewed the history. No updates at this time.     Family History:  Family History  Problem Relation Age of Onset  . Stroke Mother        died age 71  . Diabetes Mother   . Breast cancer Mother 17  . Breast cancer Sister 30  . Breast cancer Paternal Aunt 24  . Diabetes Maternal Grandfather   . Breast cancer Paternal Grandmother 14  . Breast cancer Paternal Aunt        dx in her 27s  . Cancer Maternal Grandmother        intra-abdominal cancer  . Brain cancer Maternal Uncle 8  . Brain cancer Cousin 4       maternal cousin  . Brain cancer Cousin 20       paternal cousin  . Colon cancer Neg Hx     Social History:  Social History   Socioeconomic History  . Marital status: Married    Spouse name: Not on file  . Number of children: 3  . Years of education: Not on file  . Highest education level: Not on file  Occupational History  . Occupation: retired Medical laboratory scientific officer  . Financial resource strain: Not on file  . Food insecurity:    Worry: Not on file    Inability: Not on file  . Transportation needs:    Medical: Not on file    Non-medical: Not on file  Tobacco Use  . Smoking status: Former Smoker    Packs/day: 0.10    Years: 2.00    Pack years: 0.20    Types: Cigarettes    Start date: 11/16/1959    Last attempt to quit: 11/15/1960    Years since quitting: 57.8  . Smokeless tobacco: Never Used  Substance and Sexual Activity  . Alcohol use: No    Alcohol/week: 0.0 standard drinks  . Drug use: No  . Sexual activity: Not on file    Comment: menarche age 63, fist live birth 73, P 3, hysterectomy age 40, no HRT, BCP 2 yrs  Lifestyle  . Physical activity:    Days per week: Not on file    Minutes per session: Not on file  . Stress: Not on file  Relationships  . Social connections:    Talks on phone: Not on file     Gets together: Not on file    Attends religious service: Not on file  Active member of club or organization: Not on file    Attends meetings of clubs or organizations: Not on file    Relationship status: Not on file  Other Topics Concern  . Not on file  Social History Narrative   Occupation:  Retired Radiation protection practitioner    Married with 3 grown children      Never Smoked     Alcohol use-no         Allergies: No Known Allergies  Metabolic Disorder Labs: Lab Results  Component Value Date   HGBA1C 6.0 07/14/2017   MPG 126 (H) 08/27/2013   MPG 117 (H) 06/22/2013   Lab Results  Component Value Date   PROLACTIN 3.9 07/22/2009   Lab Results  Component Value Date   CHOL 164 07/20/2018   TRIG 105.0 07/20/2018   HDL 46.50 07/20/2018   CHOLHDL 4 07/20/2018   VLDL 21.0 07/20/2018   LDLCALC 96 07/20/2018   LDLCALC 105 (H) 07/14/2017   Lab Results  Component Value Date   TSH 0.84 07/20/2018   TSH 0.75 07/14/2017    Therapeutic Level Labs: No results found for: LITHIUM No results found for: VALPROATE No components found for:  CBMZ  Current Medications: Current Outpatient Medications  Medication Sig Dispense Refill  . amoxicillin (AMOXIL) 500 MG capsule Take 500 mg by mouth 3 (three) times daily.    . Calcium Carb-Cholecalciferol (CALCIUM-VITAMIN D) 500-400 MG-UNIT TABS Take by mouth 2 (two) times daily.     . cholecalciferol (VITAMIN D) 1000 units tablet Take 2,000 Units by mouth daily.    Marland Kitchen co-enzyme Q-10 30 MG capsule Take 300 mg by mouth daily.     . cyclobenzaprine (FLEXERIL) 10 MG tablet Take 1 tablet (10 mg total) by mouth 3 (three) times daily as needed for muscle spasms. 90 tablet 0  . diazepam (VALIUM) 5 MG tablet Take 1/2 - 1 tablet prior to dental procedure 5 tablet 1  . diclofenac sodium (VOLTAREN) 1 % GEL as needed.     Marland Kitchen esomeprazole (NEXIUM) 40 MG capsule Take 1 capsule by mouth  daily 90 capsule 3  . levothyroxine (SYNTHROID, LEVOTHROID) 100 MCG tablet Take 1  tablet (100 mcg total) by mouth daily before breakfast. Take one pill in middle of night 90 tablet 3  . LUTEIN PO Take by mouth.    . Melatonin 3 MG CAPS Take by mouth at bedtime.    . meloxicam (MOBIC) 7.5 MG tablet Take 1 tablet (7.5 mg total) by mouth 2 (two) times daily as needed for pain. 90 tablet 1  . omega-3 acid ethyl esters (LOVAZA) 1 g capsule Take 1 capsule (1 g total) by mouth 2 (two) times daily. 180 capsule 3  . oxyCODONE-acetaminophen (PERCOCET/ROXICET) 5-325 MG tablet Take 1 tablet by mouth every 8 (eight) hours as needed for severe pain. 8 tablet 0  . polyethylene glycol (MIRALAX / GLYCOLAX) packet Take 17 g by mouth daily.    . pregabalin (LYRICA) 75 MG capsule Take 1 capsule (75 mg total) by mouth 2 (two) times daily. 180 capsule 0  . saccharomyces boulardii (FLORASTOR) 250 MG capsule Take 250 mg by mouth daily.    . simvastatin (ZOCOR) 10 MG tablet Take 1 tablet (10 mg total) by mouth at bedtime. 90 tablet 3  . tamoxifen (NOLVADEX) 20 MG tablet Take 1 tablet (20 mg total) daily by mouth. 90 tablet 4  . traMADol (ULTRAM) 50 MG tablet Take 1-2 tablets (50-100 mg total) by mouth every 6 (six) hours  as needed (mild pain). 80 tablet 1  . TURMERIC PO Take 1,000 mg by mouth daily.    Marland Kitchen umeclidinium-vilanterol (ANORO ELLIPTA) 62.5-25 MCG/INH AEPB Inhale 1 puff into the lungs daily. 14 each 0  . umeclidinium-vilanterol (ANORO ELLIPTA) 62.5-25 MCG/INH AEPB Inhale 1 puff into the lungs daily. 60 each 6  . venlafaxine XR (EFFEXOR-XR) 150 MG 24 hr capsule Take 1 capsule (150 mg total) by mouth daily. 90 capsule 1   No current facility-administered medications for this visit.      Musculoskeletal: Strength & Muscle Tone: within normal limits Gait & Station: normal Patient leans: N/A  Psychiatric Specialty Exam: Review of Systems  Psychiatric/Behavioral: Positive for depression. Negative for hallucinations, memory loss, substance abuse and suicidal ideas. The patient is not  nervous/anxious and does not have insomnia.   All other systems reviewed and are negative.   Blood pressure 128/78, pulse 61, height 4\' 11"  (1.499 m), weight 156 lb (70.8 kg), SpO2 98 %.Body mass index is 31.51 kg/m.  General Appearance: Fairly Groomed  Eye Contact:  Good  Speech:  Clear and Coherent  Volume:  Normal  Mood:  "fantastic"  Affect:  Appropriate, Congruent and slightly down at times  Thought Process:  Coherent  Orientation:  Full (Time, Place, and Person)  Thought Content: Logical   Suicidal Thoughts:  No  Homicidal Thoughts:  No  Memory:  Immediate;   Good  Judgement:  Good  Insight:  Fair  Psychomotor Activity:  Normal  Concentration:  Concentration: Good and Attention Span: Good  Recall:  Good  Fund of Knowledge: Good  Language: Good  Akathisia:  No  Handed:  Right  AIMS (if indicated): not done  Assets:  Communication Skills Desire for Improvement  ADL's:  Intact  Cognition: WNL  Sleep:  Fair   Screenings: PHQ2-9     Office Visit from 07/20/2018 in [place] at [facility] from 07/14/2017 in [place] at [facility] from 11/28/2015 in [place] at [facility] from 10/14/2014 in [place] at [facility] from 10/01/2013 in [place] at [facility] Total Score  0  0  0  1  0  PHQ-9 Total Score  -  5  -  -  -       Assessment and Plan:  Jean Nash is a 77 y.o. year old female with a history of depression, anxiety,  breast cancer s/p mastectomy, who presents for follow up appointment for Recurrent major depressive disorder, in partial remission ([place])  # MDD, mild, recurrent without psychotic features Although there was a few hours of feeling depressed yesterday, she denies any other significant mood symptoms since her last appointment.  Will continue current dose of Effexor to target depression.  Discussed risk of hypertension.  Discussed behavioral activation.   Will consider tapering down of Effexor at the next visit if she denies significant mood symptoms (patient has a history of depression, although she has not tried psychotropics in the past. )  Plan I have reviewed and updated plans as below 1. Continue Effexor 150 mg daily  2. Return to clinic in four months for 15 mins  The patient demonstrates the following risk factors for suicide: Chronic risk factors for suicide include:psychiatric disorder ofdepression. Acute risk factorsfor suicide include: N/A. Protective factorsfor this patient include: positive social support, coping skills and hope for the future. Considering these factors, the overall suicide risk at this point appears to below. Patientisappropriate for outpatient follow  up.  Norman Clay, MD 09/13/2018, 10:58 AM

## 2018-09-12 ENCOUNTER — Ambulatory Visit: Payer: Medicare HMO | Admitting: Physical Therapy

## 2018-09-12 ENCOUNTER — Encounter: Payer: Self-pay | Admitting: Physical Therapy

## 2018-09-12 DIAGNOSIS — M545 Low back pain, unspecified: Secondary | ICD-10-CM

## 2018-09-12 DIAGNOSIS — I972 Postmastectomy lymphedema syndrome: Secondary | ICD-10-CM

## 2018-09-12 DIAGNOSIS — R293 Abnormal posture: Secondary | ICD-10-CM

## 2018-09-12 DIAGNOSIS — M25511 Pain in right shoulder: Secondary | ICD-10-CM

## 2018-09-12 DIAGNOSIS — M5440 Lumbago with sciatica, unspecified side: Secondary | ICD-10-CM | POA: Diagnosis not present

## 2018-09-12 DIAGNOSIS — I890 Lymphedema, not elsewhere classified: Secondary | ICD-10-CM | POA: Diagnosis not present

## 2018-09-12 NOTE — Therapy (Signed)
[place], [place], [address] Phone: [phone]   Fax:  [phone]  Physical Therapy Treatment  Patient Details  Name: Jean Nash MRN: [id number] Date of Birth: August 14, 1941 Referring Provider (PT): [name]   Encounter Date: 09/12/2018  PT End of Session - 09/12/18 1709    Visit Number  3    Number of Visits  17    Date for PT Re-Evaluation  10/24/18    PT Start Time  1600    PT Stop Time  1645    PT Time Calculation (min)  45 min    Activity Tolerance  Patient tolerated treatment well    Behavior During Therapy  [facility] for tasks assessed/performed       Past Medical History:  Diagnosis Date  . Anxiety    PHOBIAS  . Arthritis   . Breast cancer ([place]) 08/08/13   right LOQ  . Cataract   . Chronic insomnia   . Cluster headaches    history of migraines / NONE FOR SEVERAL YRS  . Depression   . Fibromyalgia   . GERD (gastroesophageal reflux disease)   . H/O hiatal hernia   . History of colonic polyps   . History of transfusion 08/30/2013  . Hx of radiation therapy 10/29/13- 12/14/13   right chest wall 5040 cGy 28 sessions, right supraclavicular/axillary region 5040 cGy 28 sessions, right chest wall boost 1000 cGy 5 sessions  . Hypothyroidism   . Irritable bowel syndrome   . Kidney stone   . Lymphedema    RT ARM - WEARS SLEEVE  . Macular degeneration    hole/right eye  . Osteopenia   . Other abnormal glucose   . Other and unspecified hyperlipidemia   . Pain in joint, shoulder region   . Pneumonia 4332,9518  . Sleep apnea    USES C-PAP  . Stress incontinence, female     Past Surgical History:  Procedure Laterality Date  . ABDOMINAL HYSTERECTOMY    . APPENDECTOMY    . BILATERAL TOTAL MASTECTOMY WITH AXILLARY LYMPH NODE DISSECTION  08/30/2013   Dr Barry Dienes  . BREAST CYST ASPIRATION     9 cysts  . CATARACT EXTRACTION, BILATERAL  2005/2007  . CHOLECYSTECTOMY    . COLONOSCOPY     . EVACUATION BREAST HEMATOMA Left 08/31/2013   Procedure: EVACUATION HEMATOMA BREAST;  Surgeon: Stark Klein, MD;  Location: [facility];  Service: General;  Laterality: Left;  . EYE SURGERY     to repair macular hole  . FOOT ARTHROPLASTY     lt   . GANGLION CYST EXCISION     rt foot  . HEMORRHOID SURGERY     03/1993  . JOINT REPLACEMENT  03/15/11   left knee replacement  . KNEE ARTHROSCOPY     /partial knee 2016/left knee 2012  . MASS EXCISION  11/04/2011   Procedure: EXCISION MASS;  Surgeon: Cammie Sickle., MD;  Location: [facility];  Service: Orthopedics;  Laterality: Right;  excisional biopsy right ulna mass  . MASTECTOMY W/ SENTINEL NODE BIOPSY Right 08/30/2013   Procedure: RIGHT  AXILLARY SENTINEL LYMPH NODE BIOPSY; Right Axillary Node Disection;  Surgeon: Stark Klein, MD;  Location: [facility];  Service: General;  Laterality: Right;  Right side nuc med 7:00   . PARTIAL KNEE ARTHROPLASTY Right 11/03/2015   Procedure: RIGHT KNEE MEDIAL UNICOMPARTMENTAL ARTHROPLASTY ;  Surgeon: Gaynelle Arabian, MD;  [facility];  Service: Orthopedics;  Laterality: Right;  .  SIMPLE MASTECTOMY WITH AXILLARY SENTINEL NODE BIOPSY Left 08/30/2013   Procedure: Bilateral Breast Mastectomy ;  Surgeon: Stark Klein, MD;  Location: [facility];  Service: General;  Laterality: Left;  . skin tags removed     breast, panty line, neckline  . TOE SURGERY     preventative crossover toe surg/right foot  . TOE SURGERY  2009   left foot/screw  in 2nd toe  . TONSILLECTOMY    . UPPER GASTROINTESTINAL ENDOSCOPY      There were no vitals filed for this visit.  Subjective Assessment - 09/12/18 1612    Subjective  Pt is starting a prednisone dose pak for her back pain.  She had careful xrays and Dr. Tonita Cong feels that her back pain will be temporary,     Pertinent History  She had a bilateral mastectomy with a right axillary node dissection removing 16 lymph nodes and 9 of those were positive.  Currently taking  tamoxifen, Bil TKR    Patient Stated Goals  to get manual lymph drainage to help with the swelling     Currently in Pain?  No/denies                  Outpatient Re[facility]  Lymphedema Life Impact Scale Total Score  27.94 %           [facility] Adult PT Treatment/Exercise - 09/12/18 0001      Shoulder Exercises: Seated   Other Seated Exercises  arms in "T" postitoin and ROM on end range to internal and external ROM       Shoulder Exercises: Standing   Other Standing Exercises  pinkys up the wall on foam roller , then from "W" position, elbows back 10 reps on either side       Manual Therapy   Manual Therapy  Soft tissue mobilization;Myofascial release    Soft tissue mobilization  with thick cream in left sidelying to right back, neck and scapular and axillary area     Myofascial Release  deep pressure to trigger points for release                PT Short Term Goals - 08/24/18 1956      PT SHORT TERM GOAL #1   Title  Pt will obtain appropriate compression garment for the Rt UE and will be knowledgeable about use including hand piece and compression glove    Time  4    Period  Weeks    Status  New      PT SHORT TERM GOAL #2   Title  Pt will reports incidents of "grabbing" right shoulder pain are decreased by 50%    Time  4    Period  Weeks      PT SHORT TERM GOAL #3   Title  Pt reports she is able to manage her lymphedema independently at home    Time  8    Period  Weeks    Status  New        PT Long Term Goals - 08/24/18 1955      PT LONG TERM GOAL #1   Title  pt will be independent in a strengthening exercise program     Time  8    Period  Weeks    Status  New      PT LONG TERM GOAL #2   Title  pt will report the incidents of right shoulder pain are  decreased by 75%    Time  8    Period  Weeks    Status  New      PT LONG TERM GOAL #3   Title  Pt will report her back pain has  decreased by 50%    Time  8    Period  Weeks    Status  New            Plan - 09/12/18 1709    Clinical Impression Statement  Pt is pleased with her Flexitouch trial and that she was able to get some good reduction around her torso.  She continues to have some catching in her right axilla with painful trigger points at her posterior scapula and axilla     PT Next Visit Plan   continue exercise and Manual work to tight trigger points , postural exercise, strengthening to scapular area. MLD as needed        Patient will benefit from skilled therapeutic intervention in order to improve the following deficits and impairments:  Pain, Increased edema, Increased fascial restricitons, Postural dysfunction, Decreased strength, Obesity, Impaired perceived functional ability, Increased muscle spasms  Visit Diagnosis: Postmastectomy lymphedema  Abnormal posture  Acute pain of right shoulder  Acute midline low back pain without sciatica     Problem List Patient Active Problem List   Diagnosis Date Noted  . MDD (major depressive disorder), recurrent episode, mild ([place]) 12/22/2017  . Osteoporosis 07/14/2017  . OA (osteoarthritis) of knee 11/03/2015  . Osteopenia 08/08/2015  . Obstructive sleep apnea 06/25/2015  . Headache 02/03/2015  . Atypical chest pain 07/15/2014  . Arthralgia 02/22/2014  . Psoriasis 02/22/2014  . Malignant neoplasm of lower-outer quadrant of right breast of female, estrogen receptor positive ([place]) 08/09/2013  . Neck pain 06/22/2013  . Left knee pain 11/29/2012  . Reactive depression ([place]) 04/26/2012  . Right wrist pain 02/02/2012  . Right foot pain 10/04/2011  . Nevus 06/05/2011  . Status post total knee replacement 04/06/2011  . Overactive bladder 03/14/2011  . KNEE PAIN, LEFT 08/31/2010  . HEARING LOSS 08/17/2010  . HIRSUTISM 06/16/2009  . CYST, IDIOPATHIC 05/20/2008  . IRRITABLE BOWEL SYNDROME 04/15/2008  . Allergic asthma, mild intermittent,  uncomplicated 89/38/1017  . GERD 03/19/2008  . COLONIC POLYPS, HX OF 03/19/2008  . NEPHROLITHIASIS, HX OF 03/19/2008  . Hypothyroidism 03/18/2008  . Hyperlipidemia 03/18/2008  . INSOMNIA, CHRONIC 03/18/2008  . FIBROMYALGIA 03/18/2008  . Prediabetes 03/18/2008   Donato Heinz. Owens Shark PT  Norwood Levo 09/12/2018, 5:13 PM  [place], [place], [address] Phone: [phone]   Fax:  [phone]  Name: Parris Signer MRN: [id number] Date of Birth: 1941-10-22

## 2018-09-13 ENCOUNTER — Ambulatory Visit (INDEPENDENT_AMBULATORY_CARE_PROVIDER_SITE_OTHER): Payer: Medicare HMO | Admitting: Psychiatry

## 2018-09-13 ENCOUNTER — Encounter (HOSPITAL_COMMUNITY): Payer: Self-pay | Admitting: Psychiatry

## 2018-09-13 VITALS — BP 128/78 | HR 61 | Ht 59.0 in | Wt 156.0 lb

## 2018-09-13 DIAGNOSIS — F3341 Major depressive disorder, recurrent, in partial remission: Secondary | ICD-10-CM

## 2018-09-13 DIAGNOSIS — R69 Illness, unspecified: Secondary | ICD-10-CM | POA: Diagnosis not present

## 2018-09-13 MED ORDER — VENLAFAXINE HCL ER 150 MG PO CP24: 150.0000 mg | ORAL_CAPSULE | Freq: Every day | ORAL | 1 refills | Status: DC

## 2018-09-13 NOTE — Patient Instructions (Signed)
1. Continue Effexor 150 mg daily  2. Return to clinic in four months for 15 mins

## 2018-09-14 ENCOUNTER — Encounter: Payer: Self-pay | Admitting: Physical Therapy

## 2018-09-14 ENCOUNTER — Ambulatory Visit: Payer: Medicare HMO | Admitting: Physical Therapy

## 2018-09-14 DIAGNOSIS — M25511 Pain in right shoulder: Secondary | ICD-10-CM

## 2018-09-14 DIAGNOSIS — M545 Low back pain, unspecified: Secondary | ICD-10-CM

## 2018-09-14 DIAGNOSIS — R293 Abnormal posture: Secondary | ICD-10-CM

## 2018-09-14 DIAGNOSIS — I972 Postmastectomy lymphedema syndrome: Secondary | ICD-10-CM | POA: Diagnosis not present

## 2018-09-14 DIAGNOSIS — M5440 Lumbago with sciatica, unspecified side: Secondary | ICD-10-CM | POA: Diagnosis not present

## 2018-09-14 NOTE — Therapy (Signed)
[place], [place], [address] Phone: [phone]   Fax:  [phone]  Physical Therapy Treatment  Patient Details  Name: Jean Nash MRN: [id number] Date of Birth: 1941-07-18 Referring Provider (PT): [name]   Encounter Date: 09/14/2018  PT End of Session - 09/14/18 2057    Visit Number  4    Number of Visits  17    Date for PT Re-Evaluation  10/24/18    PT Start Time  2595    PT Stop Time  1430    PT Time Calculation (min)  45 min    Activity Tolerance  Patient tolerated treatment well    Behavior During Therapy  [facility] for tasks assessed/performed       Past Medical History:  Diagnosis Date  . Anxiety    PHOBIAS  . Arthritis   . Breast cancer ([place]) 08/08/13   right LOQ  . Cataract   . Chronic insomnia   . Cluster headaches    history of migraines / NONE FOR SEVERAL YRS  . Depression   . Fibromyalgia   . GERD (gastroesophageal reflux disease)   . H/O hiatal hernia   . History of colonic polyps   . History of transfusion 08/30/2013  . Hx of radiation therapy 10/29/13- 12/14/13   right chest wall 5040 cGy 28 sessions, right supraclavicular/axillary region 5040 cGy 28 sessions, right chest wall boost 1000 cGy 5 sessions  . Hypothyroidism   . Irritable bowel syndrome   . Kidney stone   . Lymphedema    RT ARM - WEARS SLEEVE  . Macular degeneration    hole/right eye  . Osteopenia   . Other abnormal glucose   . Other and unspecified hyperlipidemia   . Pain in joint, shoulder region   . Pneumonia 6387,5643  . Sleep apnea    USES C-PAP  . Stress incontinence, female     Past Surgical History:  Procedure Laterality Date  . ABDOMINAL HYSTERECTOMY    . APPENDECTOMY    . BILATERAL TOTAL MASTECTOMY WITH AXILLARY LYMPH NODE DISSECTION  08/30/2013   Dr Barry Dienes  . BREAST CYST ASPIRATION     9 cysts  . CATARACT EXTRACTION, BILATERAL  2005/2007  . CHOLECYSTECTOMY    . COLONOSCOPY     . EVACUATION BREAST HEMATOMA Left 08/31/2013   Procedure: EVACUATION HEMATOMA BREAST;  Surgeon: Stark Klein, MD;  Location: [place];  Service: General;  Laterality: Left;  . EYE SURGERY     to repair macular hole  . FOOT ARTHROPLASTY     lt   . GANGLION CYST EXCISION     rt foot  . HEMORRHOID SURGERY     03/1993  . JOINT REPLACEMENT  03/15/11   left knee replacement  . KNEE ARTHROSCOPY     /partial knee 2016/left knee 2012  . MASS EXCISION  11/04/2011   Procedure: EXCISION MASS;  Surgeon: Cammie Sickle., MD;  Location: [place];  Service: Orthopedics;  Laterality: Right;  excisional biopsy right ulna mass  . MASTECTOMY W/ SENTINEL NODE BIOPSY Right 08/30/2013   Procedure: RIGHT  AXILLARY SENTINEL LYMPH NODE BIOPSY; Right Axillary Node Disection;  Surgeon: Stark Klein, MD;  Location: [place];  Service: General;  Laterality: Right;  Right side nuc med 7:00   . PARTIAL KNEE ARTHROPLASTY Right 11/03/2015   Procedure: RIGHT KNEE MEDIAL UNICOMPARTMENTAL ARTHROPLASTY ;  Surgeon: Gaynelle Arabian, MD;  [facility];  Service: Orthopedics;  Laterality: Right;  .  SIMPLE MASTECTOMY WITH AXILLARY SENTINEL NODE BIOPSY Left 08/30/2013   Procedure: Bilateral Breast Mastectomy ;  Surgeon: Stark Klein, MD;  Location: [place];  Service: General;  Laterality: Left;  . skin tags removed     breast, panty line, neckline  . TOE SURGERY     preventative crossover toe surg/right foot  . TOE SURGERY  2009   left foot/screw  in 2nd toe  . TONSILLECTOMY    . UPPER GASTROINTESTINAL ENDOSCOPY      There were no vitals filed for this visit.  Subjective Assessment - 09/14/18 1351    Subjective  Flexitouch should arrive tomorrow and she will get demonstration on Monday. She is not sleeping from the prednisone and has lots of appointments today,  Otherwise doing well     Pertinent History  She had a bilateral mastectomy with a right axillary node dissection removing 16 lymph nodes and 9 of  those were positive.  Currently taking tamoxifen, Bil TKR    Patient Stated Goals  to get manual lymph drainage to help with the swelling     Currently in Pain?  No/denies                  Outpatient Re[facility]  Lymphedema Life Impact Scale Total Score  27.94 %           [facility] Adult PT Treatment/Exercise - 09/14/18 0001      Exercises   Exercises  Shoulder      Shoulder Exercises: Standing   External Rotation  AROM;Right;Left;5 reps    Retraction  Strengthening;Right;Left;5 reps   multiple verbal and tactile cues   Other Standing Exercises  standing dowel for shoulder flexion and abduction with back agaist the wall for tactile cues for posture.  Pt given multiple verbal and tactile cues for posture and core engagement.  Took  a picture on pts phone of her posture as she does not seem to understand the degree of her forward head posture.       Manual Therapy   Soft tissue mobilization  with thick cream, soft tissue work to [employer] trigger points in back, axilla and chest              PT Education - 09/14/18 2055    Education Details  [place] home exercise program as in patient instructions section     Person(s) Educated  Patient    Methods  Explanation;Demonstration;Handout    Comprehension  Verbalized understanding;Returned demonstration       PT Short Term Goals - 08/24/18 1956      PT SHORT TERM GOAL #1   Title  Pt will obtain appropriate compression garment for the Rt UE and will be knowledgeable about use including hand piece and compression glove    Time  4    Period  Weeks    Status  New      PT SHORT TERM GOAL #2   Title  Pt will reports incidents of "grabbing" right shoulder pain are decreased by 50%    Time  4    Period  Weeks      PT SHORT TERM GOAL #3   Title  Pt reports she is able to manage her lymphedema independently at home    Time  8    Period  Weeks    Status  New         PT Long Term Goals - 08/24/18 1955  PT LONG TERM GOAL #1   Title  pt will be independent in a strengthening exercise program     Time  8    Period  Weeks    Status  New      PT LONG TERM GOAL #2   Title  pt will report the incidents of right shoulder pain are decreased by 75%    Time  8    Period  Weeks    Status  New      PT LONG TERM GOAL #3   Title  Pt will report her back pain has decreased by 50%    Time  8    Period  Weeks    Status  New            Plan - 09/14/18 2057    Clinical Impression Statement  Pt continues to have mulitple trigger points that literally cause her to suddenly jump from the table.  However, with soft tissue work, these areas appeared to lessen and be less tender. Pt needed verbal, tactile and visual cues to correct posture with exercise     PT Frequency  2x / week    PT Duration  8 weeks    PT Treatment/Interventions  ADLs/Self Care Home Management;DME Instruction;Therapeutic exercise;Therapeutic activities;Manual techniques;Manual lymph drainage;Compression bandaging;Passive range of motion;Dry needling;Taping;Patient/family education;Orthotic Fit/Training    PT Next Visit Plan   continue exercise and Manual work to tight trigger points , postural exercise, strengthening to scapular area. MLD as needed     Consulted and Agree with Plan of Care  Patient       Patient will benefit from skilled therapeutic intervention in order to improve the following deficits and impairments:  Pain, Increased edema, Increased fascial restricitons, Postural dysfunction, Decreased strength, Obesity, Impaired perceived functional ability, Increased muscle spasms  Visit Diagnosis: Postmastectomy lymphedema  Abnormal posture  Acute pain of right shoulder  Acute midline low back pain without sciatica     Problem List Patient Active Problem List   Diagnosis Date Noted  . MDD (major depressive disorder), recurrent episode, mild ([place]) 12/22/2017   . Osteoporosis 07/14/2017  . OA (osteoarthritis) of knee 11/03/2015  . Osteopenia 08/08/2015  . Obstructive sleep apnea 06/25/2015  . Headache 02/03/2015  . Atypical chest pain 07/15/2014  . Arthralgia 02/22/2014  . Psoriasis 02/22/2014  . Malignant neoplasm of lower-outer quadrant of right breast of female, estrogen receptor positive ([place]) 08/09/2013  . Neck pain 06/22/2013  . Left knee pain 11/29/2012  . Reactive depression (situational) 04/26/2012  . Right wrist pain 02/02/2012  . Right foot pain 10/04/2011  . Nevus 06/05/2011  . Status post total knee replacement 04/06/2011  . Overactive bladder 03/14/2011  . KNEE PAIN, LEFT 08/31/2010  . HEARING LOSS 08/17/2010  . HIRSUTISM 06/16/2009  . CYST, IDIOPATHIC 05/20/2008  . IRRITABLE BOWEL SYNDROME 04/15/2008  . Allergic asthma, mild intermittent, uncomplicated 12/45/8099  . GERD 03/19/2008  . COLONIC POLYPS, HX OF 03/19/2008  . NEPHROLITHIASIS, HX OF 03/19/2008  . Hypothyroidism 03/18/2008  . Hyperlipidemia 03/18/2008  . INSOMNIA, CHRONIC 03/18/2008  . FIBROMYALGIA 03/18/2008  . Prediabetes 03/18/2008   Donato Heinz. Owens Shark PT  Norwood Levo 09/14/2018, 9:00 PM  [place], [place], [address] Phone: [phone]   Fax:  [phone]  Name: Jean Nash MRN: [id number] Date of Birth: [birth year]

## 2018-09-14 NOTE — Patient Instructions (Signed)
Access Code: [email or web address]  URL: [email or web address]  Date: 09/14/2018  Prepared by: Maudry Diego   Exercises  Standing Shoulder Flexion AAROM with Dowel - 10 reps - 3 sets - 1x daily - 7x weekly  Standing Shoulder Abduction AAROM with Dowel - 10 reps - 3 sets - 1x daily - 7x weekly  Standing Shoulder Extension with Dowel - 10 reps - 3 sets - 1x daily - 7x weekly  Standing Scapular Retraction in Abduction - 10 reps - 3 sets - 1x daily - 7x weekly  Sidelying Shoulder External Rotation - 10 reps - 3 sets - 1x daily - 7x weekly  Seated Shoulder External Rotation - 10 reps - 3 sets - 1x daily - 7x weekly

## 2018-09-15 DIAGNOSIS — I972 Postmastectomy lymphedema syndrome: Secondary | ICD-10-CM | POA: Diagnosis not present

## 2018-09-19 ENCOUNTER — Encounter: Payer: Self-pay | Admitting: Physical Therapy

## 2018-09-19 ENCOUNTER — Ambulatory Visit: Payer: Medicare HMO | Attending: Oncology | Admitting: Physical Therapy

## 2018-09-19 DIAGNOSIS — M545 Low back pain, unspecified: Secondary | ICD-10-CM

## 2018-09-19 DIAGNOSIS — R293 Abnormal posture: Secondary | ICD-10-CM | POA: Insufficient documentation

## 2018-09-19 DIAGNOSIS — I972 Postmastectomy lymphedema syndrome: Secondary | ICD-10-CM | POA: Insufficient documentation

## 2018-09-19 DIAGNOSIS — M25511 Pain in right shoulder: Secondary | ICD-10-CM

## 2018-09-19 NOTE — Therapy (Signed)
[place], [place], [address] Phone: [phone]   Fax:  [phone]  Physical Therapy Treatment  Patient Details  Name: Jean Nash MRN: [id number] Date of Birth: 1940-12-19 Referring Provider (PT): [name]   Encounter Date: 09/19/2018  PT End of Session - 09/19/18 1654    Visit Number  5    Number of Visits  17    Date for PT Re-Evaluation  10/24/18    PT Start Time  3016    PT Stop Time  1650    PT Time Calculation (min)  45 min    Activity Tolerance  Patient tolerated treatment well    Behavior During Therapy  [facility] for tasks assessed/performed       Past Medical History:  Diagnosis Date  . Anxiety    PHOBIAS  . Arthritis   . Breast cancer ([place]) 08/08/13   right LOQ  . Cataract   . Chronic insomnia   . Cluster headaches    history of migraines / NONE FOR SEVERAL YRS  . Depression   . Fibromyalgia   . GERD (gastroesophageal reflux disease)   . H/O hiatal hernia   . History of colonic polyps   . History of transfusion 08/30/2013  . Hx of radiation therapy 10/29/13- 12/14/13   right chest wall 5040 cGy 28 sessions, right supraclavicular/axillary region 5040 cGy 28 sessions, right chest wall boost 1000 cGy 5 sessions  . Hypothyroidism   . Irritable bowel syndrome   . Kidney stone   . Lymphedema    RT ARM - WEARS SLEEVE  . Macular degeneration    hole/right eye  . Osteopenia   . Other abnormal glucose   . Other and unspecified hyperlipidemia   . Pain in joint, shoulder region   . Pneumonia 0109,3235  . Sleep apnea    USES C-PAP  . Stress incontinence, female     Past Surgical History:  Procedure Laterality Date  . ABDOMINAL HYSTERECTOMY    . APPENDECTOMY    . BILATERAL TOTAL MASTECTOMY WITH AXILLARY LYMPH NODE DISSECTION  08/30/2013   Dr Barry Dienes  . BREAST CYST ASPIRATION     9 cysts  . CATARACT EXTRACTION, BILATERAL  2005/2007  . CHOLECYSTECTOMY    . COLONOSCOPY     . EVACUATION BREAST HEMATOMA Left 08/31/2013   Procedure: EVACUATION HEMATOMA BREAST;  Surgeon: Stark Klein, MD;  Location: [place];  Service: General;  Laterality: Left;  . EYE SURGERY     to repair macular hole  . FOOT ARTHROPLASTY     lt   . GANGLION CYST EXCISION     rt foot  . HEMORRHOID SURGERY     03/1993  . JOINT REPLACEMENT  03/15/11   left knee replacement  . KNEE ARTHROSCOPY     /partial knee 2016/left knee 2012  . MASS EXCISION  11/04/2011   Procedure: EXCISION MASS;  Surgeon: Cammie Sickle., MD;  Location: [place];  Service: Orthopedics;  Laterality: Right;  excisional biopsy right ulna mass  . MASTECTOMY W/ SENTINEL NODE BIOPSY Right 08/30/2013   Procedure: RIGHT  AXILLARY SENTINEL LYMPH NODE BIOPSY; Right Axillary Node Disection;  Surgeon: Stark Klein, MD;  Location: [facility];  Service: General;  Laterality: Right;  Right side nuc med 7:00   . PARTIAL KNEE ARTHROPLASTY Right 11/03/2015   Procedure: RIGHT KNEE MEDIAL UNICOMPARTMENTAL ARTHROPLASTY ;  Surgeon: Gaynelle Arabian, MD;  [facility];  Service: Orthopedics;  Laterality: Right;  .  SIMPLE MASTECTOMY WITH AXILLARY SENTINEL NODE BIOPSY Left 08/30/2013   Procedure: Bilateral Breast Mastectomy ;  Surgeon: Stark Klein, MD;  Location: [facility];  Service: General;  Laterality: Left;  . skin tags removed     breast, panty line, neckline  . TOE SURGERY     preventative crossover toe surg/right foot  . TOE SURGERY  2009   left foot/screw  in 2nd toe  . TONSILLECTOMY    . UPPER GASTROINTESTINAL ENDOSCOPY      There were no vitals filed for this visit.  Subjective Assessment - 09/19/18 1611    Subjective  Pt got the Flexitouch delivered and the fitter game, but she is unable to put the top part on by herself.  Her husband will be able to help her. She feels that is helping her and that is makes her feel better.     Pertinent History  She had a bilateral mastectomy with a right axillary node dissection  removing 16 lymph nodes and 9 of those were positive.  Currently taking tamoxifen, Bil TKR    Patient Stated Goals  to get manual lymph drainage to help with the swelling     Currently in Pain?  No/denies                  Outpatient Rehab from [facility]/24/[facility]  Lymphedema Life Impact Scale Total Score  27.94 %           [facility] Adult PT Treatment/Exercise - 09/19/18 0001      Exercises   Exercises  Shoulder      Shoulder Exercises: Supine   Protraction  AROM;Right;10 reps      Shoulder Exercises: Seated   Retraction  Strengthening;Right;Left;10 reps    External Rotation  Strengthening;Right;Left;10 reps      Shoulder Exercises: Sidelying   ABduction  AROM;Right;5 reps      Manual Therapy   Soft tissue mobilization  with thick cream, soft tissue work to [employer] trigger points in back, axilla and chest                PT Short Term Goals - 08/24/18 1956      PT SHORT TERM GOAL #1   Title  Pt will obtain appropriate compression garment for the Rt UE and will be knowledgeable about use including hand piece and compression glove    Time  4    Period  Weeks    Status  New      PT SHORT TERM GOAL #2   Title  Pt will reports incidents of "grabbing" right shoulder pain are decreased by 50%    Time  4    Period  Weeks      PT SHORT TERM GOAL #3   Title  Pt reports she is able to manage her lymphedema independently at home    Time  8    Period  Weeks    Status  New        PT Long Term Goals - 08/24/18 1955      PT LONG TERM GOAL #1   Title  pt will be independent in a strengthening exercise program     Time  8    Period  Weeks    Status  New      PT LONG TERM GOAL #2   Title  pt will report the incidents of right shoulder pain are decreased by 75%    Time  8  Period  Weeks    Status  New      PT LONG TERM GOAL #3   Title  Pt will report her back pain has decreased by 50%    Time  8    Period   Weeks    Status  New            Plan - 09/19/18 1655    Clinical Impression Statement  Pt did not get the [employer] link to work so will need to resend it.  She is still wearing her velcro garment and is looking forward to getting the extension band at the top and the wearease daytime gaments  She got the [employer] night vest but it does not have a sleeve attached so will need to call [facility] about that .     PT Treatment/Interventions  ADLs/Self Care Home Management;DME Instruction;Therapeutic exercise;Therapeutic activities;Manual techniques;Manual lymph drainage;Compression bandaging;Passive range of motion;Dry needling;Taping;Patient/family education;Orthotic Fit/Training    PT Next Visit Plan   Remeasure circumferences continue postural and scapular exercise and Manual work to tight trigger points , postural exercise, strengthening to scapular area. MLD as needed     Consulted and Agree with Plan of Care  Patient       Patient will benefit from skilled therapeutic intervention in order to improve the following deficits and impairments:  Pain, Increased edema, Increased fascial restricitons, Postural dysfunction, Decreased strength, Obesity, Impaired perceived functional ability, Increased muscle spasms  Visit Diagnosis: Postmastectomy lymphedema  Acute pain of right shoulder  Abnormal posture  Acute midline low back pain without sciatica     Problem List Patient Active Problem List   Diagnosis Date Noted  . MDD (major depressive disorder), recurrent episode, mild ([place]) 12/22/2017  . Osteoporosis 07/14/2017  . OA (osteoarthritis) of knee 11/03/2015  . Osteopenia 08/08/2015  . Obstructive sleep apnea 06/25/2015  . Headache 02/03/2015  . Atypical chest pain 07/15/2014  . Arthralgia 02/22/2014  . Psoriasis 02/22/2014  . Malignant neoplasm of lower-outer quadrant of right breast of female, estrogen receptor positive ([place]) 08/09/2013  . Neck pain 06/22/2013  . Left  knee pain 11/29/2012  . Reactive depression (situational) 04/26/2012  . Right wrist pain 02/02/2012  . Right foot pain 10/04/2011  . Nevus 06/05/2011  . Status post total knee replacement 04/06/2011  . Overactive bladder 03/14/2011  . KNEE PAIN, LEFT 08/31/2010  . HEARING LOSS 08/17/2010  . HIRSUTISM 06/16/2009  . CYST, IDIOPATHIC 05/20/2008  . IRRITABLE BOWEL SYNDROME 04/15/2008  . Allergic asthma, mild intermittent, uncomplicated 75/08/2584  . GERD 03/19/2008  . COLONIC POLYPS, HX OF 03/19/2008  . NEPHROLITHIASIS, HX OF 03/19/2008  . Hypothyroidism 03/18/2008  . Hyperlipidemia 03/18/2008  . INSOMNIA, CHRONIC 03/18/2008  . FIBROMYALGIA 03/18/2008  . Prediabetes 03/18/2008   Donato Heinz. Owens Shark PT  Norwood Levo 09/19/2018, 5:06 PM  [place], [place], [address] Phone: [phone]   Fax:  [phone]  Name: Jean Nash MRN: [id number] Date of Birth: 02/06/1941

## 2018-09-20 NOTE — Progress Notes (Signed)
ID: Jean Nash OB: 1941-01-28  MR#: [id number]  CSN#:[id number]  PCP: [name], NP GYN:  Marylynn Pearson SU: Stark Klein OTHER MD: Arloa Koh, Delrae Rend, Scott MacDiarmid, Venancio Poisson, Jolayne Panther Supple  CHIEF COMPLAINT: Estrogen receptor positive breast cancer   CURRENT TREATMENT: Completing 5 years of tamoxifen    BREAST CANCER HISTORY: From the original intake note:   Jean Nash had routine screening mammography a08/18/2014 showing no suspicious findings. However she was found to have breast density category C. She researched this, was alarmed by the fact that mammographic sensitivity is significantly decreased when the breasts are dense, and given her family history of breast cancer she opted for proceeding to bilateral breast MRI. This was performed at [facility] 08/01/2013. It showed in the right breast an area of nodular and linear enhancement spanning approximately 8.4 cm. There were no abnormal appearing lymph nodes and the left breast was unremarkable.  Biopsy of an area around the middle of the abnormal section of the right breast on 08/08/2013, showed ([id number]) an invasive and in situ ductal carcinoma, the invasive tumor being grade 1, estrogen receptor 90% positive, progesterone receptor 90% positive, with an MIB-105%. HER-2 testing is pending.  The patient's subsequent history is as detailed below   INTERVAL HISTORY: Jean Nash returns today for followup of her breast cancer accompanied by her husband Jean Nash. She continues on tamoxifen, with good tolerance.   She has been wearing bra prostheses that she made with shoulder pads. She noticed that she swelling to her right arm and she is wearing compression stockings. She has a physical therapist and she has a lymphedema machine that her insurance paid for.   She had lumbar spine films 08/01/2018 to evaluate low back pain.  This showed some degenerative disease, but no evidence of cancer.  On  06/06/2018 she had an ultrasound of the head and neck soft tissues to evaluate right neck lymphadenopathy which have been present for 2 or 3 weeks. This showed no findings of concern.   REVIEW OF SYSTEMS: Jean Nash reports that for exercise, she using a recumbent bike and she is completing several exercises to aid with her posture. She has lost up to 75 lbs by changing her diet to low-carb and eating more fruits and veggies. She also went swimming a lot over the summer, which she enjoyed. She hasn't been on many vacations. She denies unusual headaches, visual changes, nausea, vomiting, or dizziness. There has been no unusual cough, phlegm production, or pleurisy. This been no change in bowel or bladder habits. She denies unexplained fatigue or unexplained weight loss, bleeding, rash, or fever. A detailed review of systems was otherwise stable.     PAST MEDICAL HISTORY: Past Medical History:  Diagnosis Date  . Anxiety    PHOBIAS  . Arthritis   . Breast cancer ([facility]) 08/08/13   right LOQ  . Cataract   . Chronic insomnia   . Cluster headaches    history of migraines / NONE FOR SEVERAL YRS  . Depression   . Fibromyalgia   . GERD (gastroesophageal reflux disease)   . H/O hiatal hernia   . History of colonic polyps   . History of transfusion 08/30/2013  . Hx of radiation therapy 10/29/13- 12/14/13   right chest wall 5040 cGy 28 sessions, right supraclavicular/axillary region 5040 cGy 28 sessions, right chest wall boost 1000 cGy 5 sessions  . Hypothyroidism   . Irritable bowel syndrome   . Kidney stone   . Lymphedema  RT ARM - WEARS SLEEVE  . Macular degeneration    hole/right eye  . Osteopenia   . Other abnormal glucose   . Other and unspecified hyperlipidemia   . Pain in joint, shoulder region   . Pneumonia 1610,9604  . Sleep apnea    USES C-PAP  . Stress incontinence, female     PAST SURGICAL HISTORY: Past Surgical History:  Procedure Laterality Date  . ABDOMINAL HYSTERECTOMY     . APPENDECTOMY    . BILATERAL TOTAL MASTECTOMY WITH AXILLARY LYMPH NODE DISSECTION  08/30/2013   Dr Barry Dienes  . BREAST CYST ASPIRATION     9 cysts  . CATARACT EXTRACTION, BILATERAL  2005/2007  . CHOLECYSTECTOMY    . COLONOSCOPY    . EVACUATION BREAST HEMATOMA Left 08/31/2013   Procedure: EVACUATION HEMATOMA BREAST;  Surgeon: Stark Klein, MD;  Location: [facility];  Service: General;  Laterality: Left;  . EYE SURGERY     to repair macular hole  . FOOT ARTHROPLASTY     lt   . GANGLION CYST EXCISION     rt foot  . HEMORRHOID SURGERY     03/1993  . JOINT REPLACEMENT  03/15/11   left knee replacement  . KNEE ARTHROSCOPY     /partial knee 2016/left knee 2012  . MASS EXCISION  11/04/2011   Procedure: EXCISION MASS;  Surgeon: Cammie Sickle., MD;  Location: [facility];  Service: Orthopedics;  Laterality: Right;  excisional biopsy right ulna mass  . MASTECTOMY W/ SENTINEL NODE BIOPSY Right 08/30/2013   Procedure: RIGHT  AXILLARY SENTINEL LYMPH NODE BIOPSY; Right Axillary Node Disection;  Surgeon: Stark Klein, MD;  Location: [facility];  Service: General;  Laterality: Right;  Right side nuc med 7:00   . PARTIAL KNEE ARTHROPLASTY Right 11/03/2015   Procedure: RIGHT KNEE MEDIAL UNICOMPARTMENTAL ARTHROPLASTY ;  Surgeon: Gaynelle Arabian, MD;  Location: [facility];  Service: Orthopedics;  Laterality: Right;  . SIMPLE MASTECTOMY WITH AXILLARY SENTINEL NODE BIOPSY Left 08/30/2013   Procedure: Bilateral Breast Mastectomy ;  Surgeon: Stark Klein, MD;  Location: [facility];  Service: General;  Laterality: Left;  . skin tags removed     breast, panty line, neckline  . TOE SURGERY     preventative crossover toe surg/right foot  . TOE SURGERY  2009   left foot/screw  in 2nd toe  . TONSILLECTOMY    . UPPER GASTROINTESTINAL ENDOSCOPY      FAMILY HISTORY Family History  Problem Relation Age of Onset  . Stroke Mother        died age 47  . Diabetes Mother   . Breast cancer Mother 76  . Breast  cancer Sister 54  . Breast cancer Paternal Aunt 36  . Diabetes Maternal Grandfather   . Breast cancer Paternal Grandmother 58  . Breast cancer Paternal Aunt        dx in her 23s  . Cancer Maternal Grandmother        intra-abdominal cancer  . Brain cancer Maternal Uncle 8  . Brain cancer Cousin 76       maternal cousin  . Brain cancer Cousin 20       paternal cousin  . Colon cancer Neg Hx    the patient's father lived to be 95. The patient's mother was diagnosed with breast cancer at age 58. She died at 68 from unrelated causes. The patient had no brothers, one sister there are multiple second degree relatives with breast cancer, but  no history of ovarian cancer in the family.  GYNECOLOGIC HISTORY:  Menarche age 6, first live birth age 35, she is Bowlegs P3. She had a hysterectomy at age 9. She did not take hormone replacement. She took birth control perhaps for 2 years, in the mid-39s. She did not have any complications from that.  SOCIAL HISTORY:  She is a retired Radiation protection practitioner. Her husband Jean Nash used to be a [employer] manager. Daughter Jean Nash is a pediatrician in [place]. Daughter Jean Nash is a Engineer, water in [place]. Daughter Jean Nash is  a therapist living in [place]. The patient has 12 grandchildren. She has twin great-grandsons who are 7 and 2 other great-grandchildren. She attends the local syn[employer]    ADVANCED DIRECTIVES: In place   HEALTH MAINTENANCE: Social History   Tobacco Use  . Smoking status: Former Smoker    Packs/day: 0.10    Years: 2.00    Pack years: 0.20    Types: Cigarettes    Start date: 11/16/1959    Last attempt to quit: 11/15/1960    Years since quitting: 57.8  . Smokeless tobacco: Never Used  Substance Use Topics  . Alcohol use: No    Alcohol/week: 0.0 standard drinks  . Drug use: No     Colonoscopy: April 2014 PAP: Status post hysterectomy  Bone density: January 2014 Lipid panel:  No Known Allergies  Current Outpatient  Medications  Medication Sig Dispense Refill  . amoxicillin (AMOXIL) 500 MG capsule Take 500 mg by mouth 3 (three) times daily.    . Calcium Carb-Cholecalciferol (CALCIUM-VITAMIN D) 500-400 MG-UNIT TABS Take by mouth 2 (two) times daily.     . cholecalciferol (VITAMIN D) 1000 units tablet Take 2,000 Units by mouth daily.    Marland Kitchen co-enzyme Q-10 30 MG capsule Take 300 mg by mouth daily.     . cyclobenzaprine (FLEXERIL) 10 MG tablet Take 1 tablet (10 mg total) by mouth 3 (three) times daily as needed for muscle spasms. 90 tablet 0  . diazepam (VALIUM) 5 MG tablet Take 1/2 - 1 tablet prior to dental procedure 5 tablet 1  . diclofenac sodium (VOLTAREN) 1 % GEL as needed.     Marland Kitchen esomeprazole (NEXIUM) 40 MG capsule Take 1 capsule by mouth  daily 90 capsule 3  . levothyroxine (SYNTHROID, LEVOTHROID) 100 MCG tablet Take 1 tablet (100 mcg total) by mouth daily before breakfast. Take one pill in middle of night 90 tablet 3  . LUTEIN PO Take by mouth.    . Melatonin 3 MG CAPS Take by mouth at bedtime.    . meloxicam (MOBIC) 7.5 MG tablet Take 1 tablet (7.5 mg total) by mouth 2 (two) times daily as needed for pain. 90 tablet 1  . omega-3 acid ethyl esters (LOVAZA) 1 g capsule Take 1 capsule (1 g total) by mouth 2 (two) times daily. 180 capsule 3  . oxyCODONE-acetaminophen (PERCOCET/ROXICET) 5-325 MG tablet Take 1 tablet by mouth every 8 (eight) hours as needed for severe pain. 8 tablet 0  . polyethylene glycol (MIRALAX / GLYCOLAX) packet Take 17 g by mouth daily.    . pregabalin (LYRICA) 75 MG capsule Take 1 capsule (75 mg total) by mouth 2 (two) times daily. 180 capsule 0  . saccharomyces boulardii (FLORASTOR) 250 MG capsule Take 250 mg by mouth daily.    . simvastatin (ZOCOR) 10 MG tablet Take 1 tablet (10 mg total) by mouth at bedtime. 90 tablet 3  . tamoxifen (NOLVADEX) 20 MG tablet Take  1 tablet (20 mg total) daily by mouth. 90 tablet 4  . traMADol (ULTRAM) 50 MG tablet Take 1-2 tablets (50-100 mg total) by  mouth every 6 (six) hours as needed (mild pain). 80 tablet 1  . TURMERIC PO Take 1,000 mg by mouth daily.    Marland Kitchen umeclidinium-vilanterol (ANORO ELLIPTA) 62.5-25 MCG/INH AEPB Inhale 1 puff into the lungs daily. 14 each 0  . umeclidinium-vilanterol (ANORO ELLIPTA) 62.5-25 MCG/INH AEPB Inhale 1 puff into the lungs daily. 60 each 6  . venlafaxine XR (EFFEXOR-XR) 150 MG 24 hr capsule Take 1 capsule (150 mg total) by mouth daily. 90 capsule 1   No current facility-administered medications for this visit.     OBJECTIVE: Older white woman who appears well Vitals:   09/21/18 1148  BP: 126/61  Pulse: 69  Resp: 18  Temp: 97.9 F (36.6 C)  SpO2: 98%     Body mass index is 31.69 kg/m.    ECOG FS:1 - Symptomatic but completely ambulatory  Sclerae unicteric, EOMs intact No cervical or supraclavicular adenopathy Lungs no rales or rhonchi Heart regular rate and rhythm Abd soft, nontender, positive bowel sounds MSK no focal spinal tenderness, grade 1 right upper extremity lymphedema with compression sleeve in place Neuro: nonfocal, well oriented, appropriate affect Breasts: Status post bilateral mastectomies.  Both axillae are benign  LAB RESULTS:  CMP     Component Value Date/Time   NA 140 07/20/2018 1103   NA 140 09/01/2017 1127   K 4.2 07/20/2018 1103   K 4.1 09/01/2017 1127   CL 105 07/20/2018 1103   CO2 29 07/20/2018 1103   CO2 24 09/01/2017 1127   GLUCOSE 103 (H) 07/20/2018 1103   GLUCOSE 132 09/01/2017 1127   BUN 17 07/20/2018 1103   BUN 15.8 09/01/2017 1127   CREATININE 0.58 07/20/2018 1103   CREATININE 0.8 09/01/2017 1127   CALCIUM 9.2 07/20/2018 1103   CALCIUM 8.7 09/01/2017 1127   PROT 6.3 07/20/2018 1103   PROT 6.1 (L) 09/01/2017 1127   ALBUMIN 4.2 07/20/2018 1103   ALBUMIN 3.5 09/01/2017 1127   AST 19 07/20/2018 1103   AST 20 09/01/2017 1127   ALT 34 07/20/2018 1103   ALT 32 09/01/2017 1127   ALKPHOS 45 07/20/2018 1103   ALKPHOS 43 09/01/2017 1127   BILITOT 0.5  07/20/2018 1103   BILITOT 0.61 09/01/2017 1127   GFRNONAA >60 02/26/2018 1800   GFRAA >60 02/26/2018 1800    I No results found for: SPEP  Lab Results  Component Value Date   WBC 6.1 07/20/2018   NEUTROABS 4.0 07/20/2018   HGB 14.7 07/20/2018   HCT 43.4 07/20/2018   MCV 89.9 07/20/2018   PLT 164.0 07/20/2018      Chemistry      Component Value Date/Time   NA 140 07/20/2018 1103   NA 140 09/01/2017 1127   K 4.2 07/20/2018 1103   K 4.1 09/01/2017 1127   CL 105 07/20/2018 1103   CO2 29 07/20/2018 1103   CO2 24 09/01/2017 1127   BUN 17 07/20/2018 1103   BUN 15.8 09/01/2017 1127   CREATININE 0.58 07/20/2018 1103   CREATININE 0.8 09/01/2017 1127      Component Value Date/Time   CALCIUM 9.2 07/20/2018 1103   CALCIUM 8.7 09/01/2017 1127   ALKPHOS 45 07/20/2018 1103   ALKPHOS 43 09/01/2017 1127   AST 19 07/20/2018 1103   AST 20 09/01/2017 1127   ALT 34 07/20/2018 1103   ALT 32 09/01/2017 1127  BILITOT 0.5 07/20/2018 1103   BILITOT 0.61 09/01/2017 1127      No results found for: LABCA2  No components found for: LABCA125  No results for input(s): INR in the last 168 hours.  Urinalysis    Component Value Date/Time   COLORURINE YELLOW 10/28/2015 1356   APPEARANCEUR CLEAR 10/28/2015 1356   LABSPEC 1.011 10/28/2015 1356   PHURINE 6.0 10/28/2015 1356   GLUCOSEU NEGATIVE 10/28/2015 1356   GLUCOSEU NEGATIVE 09/26/2015 1231   HGBUR NEGATIVE 10/28/2015 1356   BILIRUBINUR n 05/17/2016 1046   KETONESUR NEGATIVE 10/28/2015 1356   PROTEINUR n 05/17/2016 1046   PROTEINUR NEGATIVE 10/28/2015 1356   UROBILINOGEN 0.2 05/17/2016 1046   UROBILINOGEN 0.2 09/26/2015 1231   NITRITE n 05/17/2016 1046   NITRITE NEGATIVE 10/28/2015 1356   LEUKOCYTESUR Negative 05/17/2016 1046    STUDIES: No results found.  ASSESSMENT: 77 y.o. BRCA negative [place] woman status post right breast lower outer quadrant biopsy 08/08/2013 for a clinical T2 N0, stage IIA invasive ductal  carcinoma, grade 1, estrogen receptor 90% positive, progesterone receptor 90% positive, with an MIB-1 of 12% and HER-2 negative  (1) status post bilateral mastectomies with right axillary lymph node dissection 08/30/2013 showing a right-sided pT1a pN2, stage IIIA invasive ductal carcinoma, grade 1, with negative margins  (2) given the marginal benefit in terms of mortality risk reduction from adjuvant chemotherapy, the patient decided to forego that option  (3) adjuvant radiation completed 12/14/2013  (4) tamoxifen started February 2015, discontinued November 2019  (5) osteopenia with a T score of -2.2 on bone density January 2014  (a) repeat bone density 08/08/2015 showed a T score of -1.4weight problem  (6) carries a VUS in CHEK2 [CHEK2 c.320-5T>A]  PLAN: Anikah is now more than 5 years out from definitive surgery for breast cancer with no evidence of disease recurrence.  This is very favorable.  She is completing 5 years of tamoxifen.  But she has tolerated that well.  By doing this she has cut in half her risk of recurrence.  At this point I am comfortable releasing her to her primary care physician.  All she will need as far as breast cancer follow-up is concerned is a yearly physician chest wall exam  I will be glad to see Trishelle at any point in the future if and when the need arises but as of now are making no further routine appointments for her here  Chauncey Cruel, MD  09/21/18 12:14 PM Medical Oncology and Hematology [facility] [place], [place] [address] Tel. [phone]    Fax. [phone]    I, Soijett Blue am acting as scribe for Dr. Sarajane Jews C. Leelah Hanna.  I, Lurline Del MD, have reviewed the above documentation for accuracy and completeness, and I agree with the above.

## 2018-09-21 ENCOUNTER — Inpatient Hospital Stay: Payer: Medicare HMO | Attending: Oncology | Admitting: Oncology

## 2018-09-21 ENCOUNTER — Ambulatory Visit: Payer: Medicare HMO | Admitting: Physical Therapy

## 2018-09-21 VITALS — BP 126/61 | HR 69 | Temp 97.9°F | Resp 18 | Ht 59.0 in | Wt 156.9 lb

## 2018-09-21 DIAGNOSIS — C50511 Malignant neoplasm of lower-outer quadrant of right female breast: Secondary | ICD-10-CM

## 2018-09-21 DIAGNOSIS — M545 Low back pain, unspecified: Secondary | ICD-10-CM

## 2018-09-21 DIAGNOSIS — Z87891 Personal history of nicotine dependence: Secondary | ICD-10-CM

## 2018-09-21 DIAGNOSIS — R293 Abnormal posture: Secondary | ICD-10-CM

## 2018-09-21 DIAGNOSIS — M25511 Pain in right shoulder: Secondary | ICD-10-CM

## 2018-09-21 DIAGNOSIS — Z853 Personal history of malignant neoplasm of breast: Secondary | ICD-10-CM | POA: Diagnosis not present

## 2018-09-21 DIAGNOSIS — M8580 Other specified disorders of bone density and structure, unspecified site: Secondary | ICD-10-CM

## 2018-09-21 DIAGNOSIS — Z170 Estrogen receptor positive status [ER+]: Secondary | ICD-10-CM

## 2018-09-21 DIAGNOSIS — I972 Postmastectomy lymphedema syndrome: Secondary | ICD-10-CM | POA: Diagnosis not present

## 2018-09-21 NOTE — Therapy (Signed)
[place], [place], [address] Phone: [phone]   Fax:  [phone]  Physical Therapy Treatment  Patient Details  Name: Jean Nash MRN: [id number] Date of Birth: [birth year] Referring Provider (PT): [name]   Encounter Date: 09/21/2018  PT End of Session - 09/21/18 1731    Visit Number  6    Number of Visits  17    Date for PT Re-Evaluation  10/24/18    PT Start Time  1430    PT Stop Time  1515    PT Time Calculation (min)  45 min    Activity Tolerance  Patient tolerated treatment well    Behavior During Therapy  [facility] for tasks assessed/performed       Past Medical History:  Diagnosis Date  . Anxiety    PHOBIAS  . Arthritis   . Breast cancer ([place]) 08/08/13   right LOQ  . Cataract   . Chronic insomnia   . Cluster headaches    history of migraines / NONE FOR SEVERAL YRS  . Depression   . Fibromyalgia   . GERD (gastroesophageal reflux disease)   . H/O hiatal hernia   . History of colonic polyps   . History of transfusion 08/30/2013  . Hx of radiation therapy 10/29/13- 12/14/13   right chest wall 5040 cGy 28 sessions, right supraclavicular/axillary region 5040 cGy 28 sessions, right chest wall boost 1000 cGy 5 sessions  . Hypothyroidism   . Irritable bowel syndrome   . Kidney stone   . Lymphedema    RT ARM - WEARS SLEEVE  . Macular degeneration    hole/right eye  . Osteopenia   . Other abnormal glucose   . Other and unspecified hyperlipidemia   . Pain in joint, shoulder region   . Pneumonia 9937,1696  . Sleep apnea    USES C-PAP  . Stress incontinence, female     Past Surgical History:  Procedure Laterality Date  . ABDOMINAL HYSTERECTOMY    . APPENDECTOMY    . BILATERAL TOTAL MASTECTOMY WITH AXILLARY LYMPH NODE DISSECTION  08/30/2013   Dr Barry Dienes  . BREAST CYST ASPIRATION     9 cysts  . CATARACT EXTRACTION, BILATERAL  2005/2007  . CHOLECYSTECTOMY    . COLONOSCOPY     . EVACUATION BREAST HEMATOMA Left 08/31/2013   Procedure: EVACUATION HEMATOMA BREAST;  Surgeon: Stark Klein, MD;  Location: [place];  Service: General;  Laterality: Left;  . EYE SURGERY     to repair macular hole  . FOOT ARTHROPLASTY     lt   . GANGLION CYST EXCISION     rt foot  . HEMORRHOID SURGERY     03/1993  . JOINT REPLACEMENT  03/15/11   left knee replacement  . KNEE ARTHROSCOPY     /partial knee 2016/left knee 2012  . MASS EXCISION  11/04/2011   Procedure: EXCISION MASS;  Surgeon: Cammie Sickle., MD;  Location: [place];  Service: Orthopedics;  Laterality: Right;  excisional biopsy right ulna mass  . MASTECTOMY W/ SENTINEL NODE BIOPSY Right 08/30/2013   Procedure: RIGHT  AXILLARY SENTINEL LYMPH NODE BIOPSY; Right Axillary Node Disection;  Surgeon: Stark Klein, MD;  Location: [place];  Service: General;  Laterality: Right;  Right side nuc med 7:00   . PARTIAL KNEE ARTHROPLASTY Right 11/03/2015   Procedure: RIGHT KNEE MEDIAL UNICOMPARTMENTAL ARTHROPLASTY ;  Surgeon: Gaynelle Arabian, MD;  [facility];  Service: Orthopedics;  Laterality: Right;  .  SIMPLE MASTECTOMY WITH AXILLARY SENTINEL NODE BIOPSY Left 08/30/2013   Procedure: Bilateral Breast Mastectomy ;  Surgeon: Stark Klein, MD;  Location: [place];  Service: General;  Laterality: Left;  . skin tags removed     breast, panty line, neckline  . TOE SURGERY     preventative crossover toe surg/right foot  . TOE SURGERY  2009   left foot/screw  in 2nd toe  . TONSILLECTOMY    . UPPER GASTROINTESTINAL ENDOSCOPY      There were no vitals filed for this visit.  Subjective Assessment - 09/21/18 1725    Subjective  Pt comes in wearing Talala that seems to fit well and provides compression to lateral chest and axilla.  She is very happy today because she has been released from [place] with Dr. Vinie Sill.  She will only now be followed by her primary care physician     Patient is accompained  by:  Family member    Pertinent History  She had a bilateral mastectomy with a right axillary node dissection removing 16 lymph nodes and 9 of those were positive.  Currently taking tamoxifen, Bil TKR    Patient Stated Goals  to get stronger    Currently in Pain?  No/denies            LYMPHEDEMA/ONCOLOGY QUESTIONNAIRE - 09/21/18 1444      Right Upper Extremity Lymphedema   15 cm Proximal to Olecranon Process  35.8 cm    10 cm Proximal to Olecranon Process  34.5 cm    Olecranon Process  25 cm    15 cm Proximal to Ulnar Styloid Process  23.8 cm    10 cm Proximal to Ulnar Styloid Process  21.7 cm    Just Proximal to Ulnar Styloid Process  16 cm    Across Hand at [employer]  17.8 cm    At [place] of 2nd Digit  5.3 cm    At [facility] of Thumb  6 cm           Outpatient Rehab from 08/24/2018 in [place]  Lymphedema Life Impact Scale Total Score  27.94 %           [facility] Adult PT Treatment/Exercise - 09/21/18 0001      Exercises   Exercises  Shoulder;Lumbar;Knee/Hip      Lumbar Exercises: Supine   Clam  5 reps    Bridge  5 reps    Other Supine Lumbar Exercises  lower trunk rotation with goal post arms       Knee/Hip Exercises: Standing   Other Standing Knee Exercises  sit to stand ( squat) x 10 repetitons       Knee/Hip Exercises: Sidelying   Hip ABduction  Strengthening;Right;Left;10 reps      Shoulder Exercises: Supine   Protraction  Strengthening;Right;Left;10 reps    Protraction Limitations  2# on left , 1# on right     Other Supine Exercises  chest press  2 sets of 10 with 2 # weight       Shoulder Exercises: Standing   Other Standing Exercises  bicep curls with 2# weight 3 sets of 10 with cues to keep abds engaged                PT Short Term Goals - 09/21/18 1734      PT SHORT TERM GOAL #1   Title  Pt will obtain appropriate compression garment for the Rt UE and  will be knowledgeable about use including hand  piece and compression glove    Status  Achieved      PT SHORT TERM GOAL #2   Title  Pt will reports incidents of "grabbing" right shoulder pain are decreased by 50%    Time  4    Period  Weeks    Status  On-going      PT SHORT TERM GOAL #3   Title  Pt reports she is able to manage her lymphedema independently at home    Time  4    Period  Weeks    Status  On-going        PT Long Term Goals - 09/21/18 1731      PT LONG TERM GOAL #1   Title  pt will be independent in a strengthening exercise program     Time  8    Period  Weeks    Status  On-going      PT LONG TERM GOAL #2   Title  pt will report the incidents of right shoulder pain are decreased by 75%    Time  8    Period  Weeks    Status  On-going      PT LONG TERM GOAL #3   Title  Pt will report her back pain has decreased by 50%    Baseline  pt says her back is not hurting any more after medicine from Dr. Tonita Cong    Status  Achieved      PT LONG TERM GOAL #4   Title  Pt will be ind with MLD for continued care    Baseline  Pt now has Flexitouch so does not have to do MLD     Status  Deferred            Plan - 09/21/18 1735    Clinical Impression Statement  Pt reports she is doing much better with the Flexitouch and has most of her compression garments.  I contacted [facility] about getting a right sleeve attached to the Tribute Night compression vest and they are waiting for the band to add to the top of the juzo sleeve. Juanita feels that she is much better with her pain and is ready to progress to the strengthening program so core and strength exercises were begun today. Reissued [employer] link for exercise from last time     Rehab Potential  Good    PT Frequency  2x / week    PT Duration  8 weeks    PT Treatment/Interventions  ADLs/Self Care Home Management;DME Instruction;Therapeutic exercise;Therapeutic activities;Manual techniques;Manual lymph drainage;Compression bandaging;Passive range of motion;Dry  needling;Taping;Patient/family education;Orthotic Fit/Training    PT Next Visit Plan  start with 3 minutes on the treadmill.  Issue and teach Strength ABC program beginning with stretches with modifications as needed        Patient will benefit from skilled therapeutic intervention in order to improve the following deficits and impairments:  Pain, Increased edema, Increased fascial restricitons, Postural dysfunction, Decreased strength, Obesity, Impaired perceived functional ability, Increased muscle spasms  Visit Diagnosis: Postmastectomy lymphedema  Acute pain of right shoulder  Abnormal posture  Acute midline low back pain without sciatica     Problem List Patient Active Problem List   Diagnosis Date Noted  . MDD (major depressive disorder), recurrent episode, mild ([place]) 12/22/2017  . Osteoporosis 07/14/2017  . OA (osteoarthritis) of knee 11/03/2015  . Osteopenia 08/08/2015  . Obstructive sleep apnea 06/25/2015  .  Headache 02/03/2015  . Atypical chest pain 07/15/2014  . Arthralgia 02/22/2014  . Psoriasis 02/22/2014  . Malignant neoplasm of lower-outer quadrant of right breast of female, estrogen receptor positive ([place]) 08/09/2013  . Neck pain 06/22/2013  . Left knee pain 11/29/2012  . Reactive depression (situational) 04/26/2012  . Right wrist pain 02/02/2012  . Right foot pain 10/04/2011  . Nevus 06/05/2011  . Status post total knee replacement 04/06/2011  . Overactive bladder 03/14/2011  . KNEE PAIN, LEFT 08/31/2010  . HEARING LOSS 08/17/2010  . HIRSUTISM 06/16/2009  . CYST, IDIOPATHIC 05/20/2008  . IRRITABLE BOWEL SYNDROME 04/15/2008  . Allergic asthma, mild intermittent, uncomplicated 31/51/7616  . GERD 03/19/2008  . COLONIC POLYPS, HX OF 03/19/2008  . NEPHROLITHIASIS, HX OF 03/19/2008  . Hypothyroidism 03/18/2008  . Hyperlipidemia 03/18/2008  . INSOMNIA, CHRONIC 03/18/2008  . FIBROMYALGIA 03/18/2008  . Prediabetes 03/18/2008   Donato Heinz. Owens Shark  PT  Norwood Levo 09/21/2018, 5:39 PM  [place], [place], [address] Phone: [phone]   Fax:  [phone]  Name: Jean Nash MRN: [id number] Date of Birth: 09-04-1941

## 2018-09-25 ENCOUNTER — Encounter: Payer: Self-pay | Admitting: Rehabilitation

## 2018-10-04 ENCOUNTER — Ambulatory Visit: Payer: Medicare HMO | Admitting: Physical Therapy

## 2018-10-04 ENCOUNTER — Encounter: Payer: Self-pay | Admitting: Physical Therapy

## 2018-10-04 DIAGNOSIS — I972 Postmastectomy lymphedema syndrome: Secondary | ICD-10-CM

## 2018-10-04 DIAGNOSIS — R293 Abnormal posture: Secondary | ICD-10-CM

## 2018-10-04 DIAGNOSIS — M25511 Pain in right shoulder: Secondary | ICD-10-CM

## 2018-10-04 DIAGNOSIS — M545 Low back pain: Secondary | ICD-10-CM | POA: Diagnosis not present

## 2018-10-04 NOTE — Therapy (Signed)
[place], [place], [address] Phone: [phone]   Fax:  [phone]  Physical Therapy Treatment  Patient Details  Name: Jean Nash MRN: [id number] Date of Birth: 09-16-1941 Referring Provider (PT): [name]   Encounter Date: 10/04/2018  PT End of Session - 10/04/18 1616    Visit Number  7    Number of Visits  17    Date for PT Re-Evaluation  10/24/18    PT Start Time  2542    PT Stop Time  1600    PT Time Calculation (min)  45 min    Activity Tolerance  Patient tolerated treatment well    Behavior During Therapy  [facility] for tasks assessed/performed       Past Medical History:  Diagnosis Date  . Anxiety    PHOBIAS  . Arthritis   . Breast cancer ([place]) 08/08/13   right LOQ  . Cataract   . Chronic insomnia   . Cluster headaches    history of migraines / NONE FOR SEVERAL YRS  . Depression   . Fibromyalgia   . GERD (gastroesophageal reflux disease)   . H/O hiatal hernia   . History of colonic polyps   . History of transfusion 08/30/2013  . Hx of radiation therapy 10/29/13- 12/14/13   right chest wall 5040 cGy 28 sessions, right supraclavicular/axillary region 5040 cGy 28 sessions, right chest wall boost 1000 cGy 5 sessions  . Hypothyroidism   . Irritable bowel syndrome   . Kidney stone   . Lymphedema    RT ARM - WEARS SLEEVE  . Macular degeneration    hole/right eye  . Osteopenia   . Other abnormal glucose   . Other and unspecified hyperlipidemia   . Pain in joint, shoulder region   . Pneumonia 7062,3762  . Sleep apnea    USES C-PAP  . Stress incontinence, female     Past Surgical History:  Procedure Laterality Date  . ABDOMINAL HYSTERECTOMY    . APPENDECTOMY    . BILATERAL TOTAL MASTECTOMY WITH AXILLARY LYMPH NODE DISSECTION  08/30/2013   Dr Barry Dienes  . BREAST CYST ASPIRATION     9 cysts  . CATARACT EXTRACTION, BILATERAL  2005/2007  . CHOLECYSTECTOMY    . COLONOSCOPY     . EVACUATION BREAST HEMATOMA Left 08/31/2013   Procedure: EVACUATION HEMATOMA BREAST;  Surgeon: Stark Klein, MD;  Location: [place];  Service: General;  Laterality: Left;  . EYE SURGERY     to repair macular hole  . FOOT ARTHROPLASTY     lt   . GANGLION CYST EXCISION     rt foot  . HEMORRHOID SURGERY     03/1993  . JOINT REPLACEMENT  03/15/11   left knee replacement  . KNEE ARTHROSCOPY     /partial knee 2016/left knee 2012  . MASS EXCISION  11/04/2011   Procedure: EXCISION MASS;  Surgeon: Cammie Sickle., MD;  Location: [place];  Service: Orthopedics;  Laterality: Right;  excisional biopsy right ulna mass  . MASTECTOMY W/ SENTINEL NODE BIOPSY Right 08/30/2013   Procedure: RIGHT  AXILLARY SENTINEL LYMPH NODE BIOPSY; Right Axillary Node Disection;  Surgeon: Stark Klein, MD;  Location: [place];  Service: General;  Laterality: Right;  Right side nuc med 7:00   . PARTIAL KNEE ARTHROPLASTY Right 11/03/2015   Procedure: RIGHT KNEE MEDIAL UNICOMPARTMENTAL ARTHROPLASTY ;  Surgeon: Gaynelle Arabian, MD;  [facility];  Service: Orthopedics;  Laterality: Right;  .  SIMPLE MASTECTOMY WITH AXILLARY SENTINEL NODE BIOPSY Left 08/30/2013   Procedure: Bilateral Breast Mastectomy ;  Surgeon: Stark Klein, MD;  Location: [place];  Service: General;  Laterality: Left;  . skin tags removed     breast, panty line, neckline  . TOE SURGERY     preventative crossover toe surg/right foot  . TOE SURGERY  2009   left foot/screw  in 2nd toe  . TONSILLECTOMY    . UPPER GASTROINTESTINAL ENDOSCOPY      There were no vitals filed for this visit.  Subjective Assessment - 10/04/18 1519    Subjective  Pt says she is using the Flexitouch daily and "It is Fantastic"  She is also wearing the Andrea vest daily with the extra padding in the axilla and is pleased that her dog ears are reducing . She is getting her Tribute Night with right sleeve attached today     Pertinent History  She had a bilateral  mastectomy with a right axillary node dissection removing 16 lymph nodes and 9 of those were positive.  She has been discharged from Dr. Jana Hakim and is no longer taking tamoxifen.  Bil TKR    Patient Stated Goals  to get stronger    Currently in Pain?  No/denies            LYMPHEDEMA/ONCOLOGY QUESTIONNAIRE - 10/04/18 1540      Right Upper Extremity Lymphedema   15 cm Proximal to Olecranon Process  35 cm    10 cm Proximal to Olecranon Process  34.5 cm    Olecranon Process  24.5 cm    15 cm Proximal to Ulnar Styloid Process  23.3 cm    10 cm Proximal to Ulnar Styloid Process  21.9 cm    Just Proximal to Ulnar Styloid Process  15.8 cm    Across Hand at [employer]  17.5 cm    At [place] of 2nd Digit  5.4 cm    At [facility] of Thumb  6.5 cm   pt had a flare up of her arthritis of her thumb today           [facility]  Lymphedema Life Impact Scale Total Score  27.94 %           [facility] Adult PT Treatment/Exercise - 10/04/18 0001      Self-Care   Self-Care  Other Self-Care Comments      Exercises   Exercises  Other Exercises    Other Exercises   reviewed exercise prescription and calculated Target heart rage range.  Pt will ride the bike at home 3 times a week for 30 minutes and will monitor her heart rate to be in range.  She wants to learn the strengthening exercises for home as she likely will not go to the [facility]       Manual Therapy   Edema Management  pt is wearing juzo velcro wrap on right arm, Andrea compression jacket with shirt under it .  She was given an extra piece of lined foam to wear over right dog ear. Left dog ear is almost completely resolved. and is very soft. Measured arm circumference.  Pt lower arm appears to be stable and she still has fullness in upper arm.  [facility]ed [facility] to see if they have the extension piece to add to the top of the juzo wrap to provide compression where she  needs it a the top of  her arm                PT Short Term Goals - 09/21/18 1734      PT SHORT TERM GOAL #1   Title  Pt will obtain appropriate compression garment for the Rt UE and will be knowledgeable about use including hand piece and compression glove    Status  Achieved      PT SHORT TERM GOAL #2   Title  Pt will reports incidents of "grabbing" right shoulder pain are decreased by 50%    Time  4    Period  Weeks    Status  On-going      PT SHORT TERM GOAL #3   Title  Pt reports she is able to manage her lymphedema independently at home    Time  4    Period  Weeks    Status  On-going        PT Long Term Goals - 09/21/18 1731      PT LONG TERM GOAL #1   Title  pt will be independent in a strengthening exercise program     Time  8    Period  Weeks    Status  On-going      PT LONG TERM GOAL #2   Title  pt will report the incidents of right shoulder pain are decreased by 75%    Time  8    Period  Weeks    Status  On-going      PT LONG TERM GOAL #3   Title  Pt will report her back pain has decreased by 50%    Baseline  pt says her back is not hurting any more after medicine from Dr. Tonita Cong    Status  Achieved      PT LONG TERM GOAL #4   Title  Pt will be ind with MLD for continued care    Baseline  Pt now has Flexitouch so does not have to do MLD     Status  Deferred            Plan - 10/04/18 1616    Clinical Impression Statement  Pt is very pleased with her progress with the Flexitouch and the new compression garments.  She just needs to learn a strengthening program and she will be ready for discharge     PT Next Visit Plan  Issue and teach Strength ABC program or other strength program        Patient will benefit from skilled therapeutic intervention in order to improve the following deficits and impairments:  Decreased coordination  Visit Diagnosis: Postmastectomy lymphedema  Acute pain of right shoulder  Abnormal  posture     Problem List Patient Active Problem List   Diagnosis Date Noted  . MDD (major depressive disorder), recurrent episode, mild ([place]) 12/22/2017  . Osteoporosis 07/14/2017  . OA (osteoarthritis) of knee 11/03/2015  . Osteopenia 08/08/2015  . Obstructive sleep apnea 06/25/2015  . Headache 02/03/2015  . Atypical chest pain 07/15/2014  . Arthralgia 02/22/2014  . Psoriasis 02/22/2014  . Malignant neoplasm of lower-outer quadrant of right breast of female, estrogen receptor positive ([place]) 08/09/2013  . Neck pain 06/22/2013  . Left knee pain 11/29/2012  . Reactive depression (situational) 04/26/2012  . Right wrist pain 02/02/2012  . Right foot pain 10/04/2011  . Nevus 06/05/2011  . Status post total knee replacement 04/06/2011  . Overactive bladder 03/14/2011  . KNEE PAIN, LEFT 08/31/2010  .  HEARING LOSS 08/17/2010  . HIRSUTISM 06/16/2009  . CYST, IDIOPATHIC 05/20/2008  . IRRITABLE BOWEL SYNDROME 04/15/2008  . Allergic asthma, mild intermittent, uncomplicated 56/25/6389  . GERD 03/19/2008  . COLONIC POLYPS, HX OF 03/19/2008  . NEPHROLITHIASIS, HX OF 03/19/2008  . Hypothyroidism 03/18/2008  . Hyperlipidemia 03/18/2008  . INSOMNIA, CHRONIC 03/18/2008  . FIBROMYALGIA 03/18/2008  . Prediabetes 03/18/2008   Donato Heinz. Owens Shark PT  Norwood Levo 10/04/2018, 4:19 PM  [place], [place], [address] Phone: [phone]   Fax:  [phone]  Name: Karalee Hauter MRN: [id number] Date of Birth: 1940/12/30

## 2018-10-04 NOTE — Patient Instructions (Addendum)
Target Heart Rate Range for light exercise is 97-105  Target Heart Rate Range for moderate exercise  Is 105-120   Ride bike 3x a week for 30 minutes with HR between 105 and 120  Strength exercises for 20-30 2 times a weeks

## 2018-10-06 ENCOUNTER — Ambulatory Visit: Payer: Medicare HMO | Admitting: Physical Therapy

## 2018-10-06 ENCOUNTER — Encounter: Payer: Self-pay | Admitting: Physical Therapy

## 2018-10-06 DIAGNOSIS — M545 Low back pain, unspecified: Secondary | ICD-10-CM

## 2018-10-06 DIAGNOSIS — M25511 Pain in right shoulder: Secondary | ICD-10-CM | POA: Diagnosis not present

## 2018-10-06 DIAGNOSIS — R293 Abnormal posture: Secondary | ICD-10-CM

## 2018-10-06 DIAGNOSIS — I972 Postmastectomy lymphedema syndrome: Secondary | ICD-10-CM | POA: Diagnosis not present

## 2018-10-06 NOTE — Therapy (Signed)
[place], [place], [address] Phone: [phone]   Fax:  [phone]  Physical Therapy Treatment  Patient Details  Name: Jean Nash MRN: [id number] Date of Birth: 1941/10/21 Referring Provider (PT): [name]   Encounter Date: 10/06/2018  PT End of Session - 10/06/18 1158    Visit Number  8    Number of Visits  17    Date for PT Re-Evaluation  10/24/18    PT Start Time  1100    PT Stop Time  1145    PT Time Calculation (min)  45 min    Activity Tolerance  Patient tolerated treatment well    Behavior During Therapy  [facility] for tasks assessed/performed       Past Medical History:  Diagnosis Date  . Anxiety    PHOBIAS  . Arthritis   . Breast cancer ([place]) 08/08/13   right LOQ  . Cataract   . Chronic insomnia   . Cluster headaches    history of migraines / NONE FOR SEVERAL YRS  . Depression   . Fibromyalgia   . GERD (gastroesophageal reflux disease)   . H/O hiatal hernia   . History of colonic polyps   . History of transfusion 08/30/2013  . Hx of radiation therapy 10/29/13- 12/14/13   right chest wall 5040 cGy 28 sessions, right supraclavicular/axillary region 5040 cGy 28 sessions, right chest wall boost 1000 cGy 5 sessions  . Hypothyroidism   . Irritable bowel syndrome   . Kidney stone   . Lymphedema    RT ARM - WEARS SLEEVE  . Macular degeneration    hole/right eye  . Osteopenia   . Other abnormal glucose   . Other and unspecified hyperlipidemia   . Pain in joint, shoulder region   . Pneumonia 5465,0354  . Sleep apnea    USES C-PAP  . Stress incontinence, female     Past Surgical History:  Procedure Laterality Date  . ABDOMINAL HYSTERECTOMY    . APPENDECTOMY    . BILATERAL TOTAL MASTECTOMY WITH AXILLARY LYMPH NODE DISSECTION  08/30/2013   Dr Barry Dienes  . BREAST CYST ASPIRATION     9 cysts  . CATARACT EXTRACTION, BILATERAL  2005/2007  . CHOLECYSTECTOMY    . COLONOSCOPY     . EVACUATION BREAST HEMATOMA Left 08/31/2013   Procedure: EVACUATION HEMATOMA BREAST;  Surgeon: Stark Klein, MD;  Location: [place];  Service: General;  Laterality: Left;  . EYE SURGERY     to repair macular hole  . FOOT ARTHROPLASTY     lt   . GANGLION CYST EXCISION     rt foot  . HEMORRHOID SURGERY     03/1993  . JOINT REPLACEMENT  03/15/11   left knee replacement  . KNEE ARTHROSCOPY     /partial knee 2016/left knee 2012  . MASS EXCISION  11/04/2011   Procedure: EXCISION MASS;  Surgeon: Cammie Sickle., MD;  Location: [place];  Service: Orthopedics;  Laterality: Right;  excisional biopsy right ulna mass  . MASTECTOMY W/ SENTINEL NODE BIOPSY Right 08/30/2013   Procedure: RIGHT  AXILLARY SENTINEL LYMPH NODE BIOPSY; Right Axillary Node Disection;  Surgeon: Stark Klein, MD;  Location: [place];  Service: General;  Laterality: Right;  Right side nuc med 7:00   . PARTIAL KNEE ARTHROPLASTY Right 11/03/2015   Procedure: RIGHT KNEE MEDIAL UNICOMPARTMENTAL ARTHROPLASTY ;  Surgeon: Gaynelle Arabian, MD;  [facility];  Service: Orthopedics;  Laterality: Right;  .  SIMPLE MASTECTOMY WITH AXILLARY SENTINEL NODE BIOPSY Left 08/30/2013   Procedure: Bilateral Breast Mastectomy ;  Surgeon: Stark Klein, MD;  Location: [place];  Service: General;  Laterality: Left;  . skin tags removed     breast, panty line, neckline  . TOE SURGERY     preventative crossover toe surg/right foot  . TOE SURGERY  2009   left foot/screw  in 2nd toe  . TONSILLECTOMY    . UPPER GASTROINTESTINAL ENDOSCOPY      There were no vitals filed for this visit.  Subjective Assessment - 10/06/18 1108    Subjective  Pt says she is enjoying her Flexitouch. She got the [employer] with a sleeve and that works Pt states she had a migraine yesterday but feels better today     Pertinent History  She had a bilateral mastectomy with a right axillary node dissection removing 16 lymph nodes and 9 of those were  positive.  She has been discharged from Dr. Jana Hakim and is no longer taking tamoxifen.  Bil TKR    Patient Stated Goals  to get stronger    Currently in Pain?  No/denies                  Outpatient Re[facility] from [facility]24/2018 [facility]  Lymphedema Life Impact Scale Total Score  27.94 %           [facility] Adult PT Treatment/Exercise - 10/06/18 0001      Exercises   Exercises  Shoulder;Lumbar;Knee/Hip;Ankle      Lumbar Exercises: Standing   Wall Slides  10 reps      Knee/Hip Exercises: Standing   Hip Flexion  AROM;Right;Left;10 reps    Hip Flexion Limitations  facing wall and sliding ipsilateral arm up the wall       Shoulder Exercises: Standing   Row  Strengthening;Right;Left;10 reps;Theraband    Theraband Level (Shoulder Row)  Level 3 (Green)    Row Limitations  cues to keep shoulders down and core stable to avoing sway     Other Standing Exercises  wall push ups     Other Standing Exercises  bicep curls       Ankle Exercises: Standing   Heel Raises  Right;Left;10 reps    Heel Raises Limitations  at wall for balance                PT Short Term Goals - 09/21/18 1734      PT SHORT TERM GOAL #1   Title  Pt will obtain appropriate compression garment for the Rt UE and will be knowledgeable about use including hand piece and compression glove    Status  Achieved      PT SHORT TERM GOAL #2   Title  Pt will reports incidents of "grabbing" right shoulder pain are decreased by 50%    Time  4    Period  Weeks    Status  On-going      PT SHORT TERM GOAL #3   Title  Pt reports she is able to manage her lymphedema independently at home    Time  4    Period  Weeks    Status  On-going        PT Long Term Goals - 09/21/18 1731      PT LONG TERM GOAL #1   Title  pt will be independent in a strengthening exercise program     Time  8    Period  Weeks    Status  On-going      PT LONG TERM GOAL #2   Title  pt will  report the incidents of right shoulder pain are decreased by 75%    Time  8    Period  Weeks    Status  On-going      PT LONG TERM GOAL #3   Title  Pt will report her back pain has decreased by 50%    Baseline  pt says her back is not hurting any more after medicine from Dr. Tonita Cong    Status  Achieved      PT LONG TERM GOAL #4   Title  Pt will be ind with MLD for continued care    Baseline  Pt now has Flexitouch so does not have to do MLD     Status  Deferred            Plan - 10/06/18 1158    Clinical Impression Statement  Pt continues to make progress.  instructed in a strengthening program today that she feels she will be able to do at home Pt needs a new day time sleeve. Recommended she go to [facility] for a flat knit off the shelf garment as she has not had a new daytime sleeve in several years     Rehab Potential  Good    Clinical Impairments Affecting Rehab Potential  will not do bandaging     PT Frequency  2x / week    PT Duration  8 weeks    PT Treatment/Interventions  ADLs/Self Care Home Management;DME Instruction;Therapeutic exercise;Therapeutic activities;Manual techniques;Manual lymph drainage;Compression bandaging;Passive range of motion;Dry needling;Taping;Patient/family education;Orthotic Fit/Training    PT Next Visit Plan  review exercises, remeasure arms reassess goals and consider DC     PT Home Exercise Plan   Access Code: [email or web address] , Pt will ride the bike for 30 minutes 3 times a week and do strengthening 2 times a week        Patient will benefit from skilled therapeutic intervention in order to improve the following deficits and impairments:  Decreased coordination  Visit Diagnosis: Postmastectomy lymphedema  Acute pain of right shoulder  Abnormal posture  Acute midline low back pain without sciatica     Problem List Patient Active Problem List   Diagnosis Date Noted  . MDD (major depressive disorder), recurrent episode, mild ([place])  12/22/2017  . Osteoporosis 07/14/2017  . OA (osteoarthritis) of knee 11/03/2015  . Osteopenia 08/08/2015  . Obstructive sleep apnea 06/25/2015  . Headache 02/03/2015  . Atypical chest pain 07/15/2014  . Arthralgia 02/22/2014  . Psoriasis 02/22/2014  . Malignant neoplasm of lower-outer quadrant of right breast of female, estrogen receptor positive ([place]) 08/09/2013  . Neck pain 06/22/2013  . Left knee pain 11/29/2012  . Reactive depression (situational) 04/26/2012  . Right wrist pain 02/02/2012  . Right foot pain 10/04/2011  . Nevus 06/05/2011  . Status post total knee replacement 04/06/2011  . Overactive bladder 03/14/2011  . KNEE PAIN, LEFT 08/31/2010  . HEARING LOSS 08/17/2010  . HIRSUTISM 06/16/2009  . CYST, IDIOPATHIC 05/20/2008  . IRRITABLE BOWEL SYNDROME 04/15/2008  . Allergic asthma, mild intermittent, uncomplicated 16/11/930  . GERD 03/19/2008  . COLONIC POLYPS, HX OF 03/19/2008  . NEPHROLITHIASIS, HX OF 03/19/2008  . Hypothyroidism 03/18/2008  . Hyperlipidemia 03/18/2008  . INSOMNIA, CHRONIC 03/18/2008  . FIBROMYALGIA 03/18/2008  . Prediabetes 03/18/2008   Donato Heinz. Owens Shark, PT  [place] Levo 10/06/2018, 12:11  PM  [place], [place], [address] Phone: [phone]   Fax:  [phone]  Name: Virginia Curl MRN: [id number] Date of Birth: May 12, 1941

## 2018-10-06 NOTE — Patient Instructions (Addendum)
Medi off the shelf sleeve and glove   Access Code: [email or web address]  URL: [email or web address]  Date: 10/06/2018  Prepared by: Maudry Diego   Exercises  Wall Push Up - 10 reps - 3 sets - 1x daily - 7x weekly  Wall Squat - 10 reps - 3 sets - 10 sec hold - 1x daily - 7x weekly  Standing Row with Anchored Resistance - 10 reps - 3 sets - 1x daily - 7x weekly  Standing March - 10 reps - 3 sets - 1x daily - 7x weekly  Standing Alternating Bicep Curls with Dumbbells and Rotation - 10 reps - 3 sets - 1x daily - 7x weekly  Isometric Heel Raise at Wall - 10 reps - 3 sets - 1x daily - 7x weekly

## 2018-10-09 ENCOUNTER — Ambulatory Visit: Payer: Medicare HMO | Admitting: Rehabilitation

## 2018-10-13 DIAGNOSIS — I890 Lymphedema, not elsewhere classified: Secondary | ICD-10-CM | POA: Diagnosis not present

## 2018-10-18 ENCOUNTER — Ambulatory Visit: Payer: Medicare HMO | Attending: Oncology | Admitting: Physical Therapy

## 2018-10-18 ENCOUNTER — Encounter: Payer: Self-pay | Admitting: Physical Therapy

## 2018-10-18 DIAGNOSIS — M545 Low back pain, unspecified: Secondary | ICD-10-CM

## 2018-10-18 DIAGNOSIS — M25511 Pain in right shoulder: Secondary | ICD-10-CM

## 2018-10-18 DIAGNOSIS — R293 Abnormal posture: Secondary | ICD-10-CM

## 2018-10-18 DIAGNOSIS — I972 Postmastectomy lymphedema syndrome: Secondary | ICD-10-CM | POA: Insufficient documentation

## 2018-10-18 NOTE — Therapy (Signed)
[place], [place], [address] Phone: [phone]   Fax:  [phone]  Physical Therapy Treatment  Patient Details  Name: Jean Nash MRN: [id number] Date of Birth: 1941-08-05 Referring Provider (PT): [name]   Encounter Date: 10/18/2018  PT End of Session - 10/18/18 1140    Visit Number  9    Number of Visits  17    Date for PT Re-Evaluation  10/24/18    PT Start Time  1100    PT Stop Time  1130   pt did not need more time today    PT Time Calculation (min)  30 min    Activity Tolerance  Patient tolerated treatment well    Behavior During Therapy   [facility] for tasks assessed/performed       Past Medical History:  Diagnosis Date  . Anxiety    PHOBIAS  . Arthritis   . Breast cancer ([place]) 08/08/13   right LOQ  . Cataract   . Chronic insomnia   . Cluster headaches    history of migraines / NONE FOR SEVERAL YRS  . Depression   . Fibromyalgia   . GERD (gastroesophageal reflux disease)   . H/O hiatal hernia   . History of colonic polyps   . History of transfusion 08/30/2013  . Hx of radiation therapy 10/29/13- 12/14/13   right chest wall 5040 cGy 28 sessions, right supraclavicular/axillary region 5040 cGy 28 sessions, right chest wall boost 1000 cGy 5 sessions  . Hypothyroidism   . Irritable bowel syndrome   . Kidney stone   . Lymphedema    RT ARM - WEARS SLEEVE  . Macular degeneration    hole/right eye  . Osteopenia   . Other abnormal glucose   . Other and unspecified hyperlipidemia   . Pain in joint, shoulder region   . Pneumonia 2633,3545  . Sleep apnea    USES C-PAP  . Stress incontinence, female     Past Surgical History:  Procedure Laterality Date  . ABDOMINAL HYSTERECTOMY    . APPENDECTOMY    . BILATERAL TOTAL MASTECTOMY WITH AXILLARY LYMPH NODE DISSECTION  08/30/2013   Dr Barry Dienes  . BREAST CYST ASPIRATION     9 cysts  . CATARACT EXTRACTION, BILATERAL  2005/2007  .  CHOLECYSTECTOMY    . COLONOSCOPY    . EVACUATION BREAST HEMATOMA Left 08/31/2013   Procedure: EVACUATION HEMATOMA BREAST;  Surgeon: Stark Klein, MD;  Location: [place];  Service: General;  Laterality: Left;  . EYE SURGERY     to repair macular hole  . FOOT ARTHROPLASTY     lt   . GANGLION CYST EXCISION     rt foot  . HEMORRHOID SURGERY     03/1993  . JOINT REPLACEMENT  03/15/11   left knee replacement  . KNEE ARTHROSCOPY     /partial knee 2016/left knee 2012  . MASS EXCISION  11/04/2011   Procedure: EXCISION MASS;  Surgeon: Cammie Sickle., MD;  Location: [place];  Service: Orthopedics;  Laterality: Right;  excisional biopsy right ulna mass  . MASTECTOMY W/ SENTINEL NODE BIOPSY Right 08/30/2013   Procedure: RIGHT  AXILLARY SENTINEL LYMPH NODE BIOPSY; Right Axillary Node Disection;  Surgeon: Stark Klein, MD;  Location: [place];  Service: General;  Laterality: Right;  Right side nuc med 7:00   . PARTIAL KNEE ARTHROPLASTY Right 11/03/2015   Procedure: RIGHT KNEE MEDIAL UNICOMPARTMENTAL ARTHROPLASTY ;  Surgeon: Gaynelle Arabian, MD;  Location: [facility];  Service: Orthopedics;  Laterality: Right;  . SIMPLE MASTECTOMY WITH AXILLARY SENTINEL NODE BIOPSY Left 08/30/2013   Procedure: Bilateral Breast Mastectomy ;  Surgeon: Stark Klein, MD;  Location: [place];  Service: General;  Laterality: Left;  . skin tags removed     breast, panty line, neckline  . TOE SURGERY     preventative crossover toe surg/right foot  . TOE SURGERY  2009   left foot/screw  in 2nd toe  . TONSILLECTOMY    . UPPER GASTROINTESTINAL ENDOSCOPY      There were no vitals filed for this visit.  Subjective Assessment - 10/18/18 1133    Subjective  Pt reports she is doing well.  She was able to exercise on her bike for 10 minutes and is trying to keep track of her heart rate.  She has all her garments except for the extra band to go at the top of her arm. She is continuing to wear her garments almost all the  time.     Pertinent History  She had a bilateral mastectomy with a right axillary node dissection removing 16 lymph nodes and 9 of those were positive.  She has been discharged from Dr. Jana Hakim and is no longer taking tamoxifen.  Bil TKR    Patient Stated Goals  to get stronger    Currently in Pain?  No/denies                  Outpatient Rehab from 08/24/2018 [facility]  Lymphedema Life Impact Scale Total Score  27.94 %           [facility] Adult PT Treatment/Exercise - 10/18/18 0001      Exercises   Exercises  Other Exercises    Other Exercises   Reviewed HR parameters using the Heart Rate Reserve method.  For light exercis her target heart rate is 98-105.  for moderate intensity her heart rate is 105-120 . Pt was educated in this and also to try to correlate it with a number from 1-10 on her perceived exertion so that she can be aware of her possible heart rate during other activities.  She says she still has some shortness of breath with activities, but that she has had lung issues before.  Even that seems to be better              PT Education - 10/18/18 1139    Education Details  Target heart rates for light and moderate exercise     Person(s) Educated  Patient;Spouse    Methods  Explanation;Handout    Comprehension  Verbalized understanding       PT Short Term Goals - 10/18/18 1121      PT SHORT TERM GOAL #1   Title  Pt will obtain appropriate compression garment for the Rt UE and will be knowledgeable about use including hand piece and compression glove    Status  Achieved      PT SHORT TERM GOAL #2   Title  Pt will reports incidents of "grabbing" right shoulder pain are decreased by 50%    Status  Achieved      PT SHORT TERM GOAL #3   Title  Pt reports she is able to manage her lymphedema independently at home    Status  Achieved        PT Long Term Goals - 10/18/18 [place] #1  Title  pt  will be independent in a strengthening exercise program     Status  Achieved      PT LONG TERM GOAL #2   Title  pt will report the incidents of right shoulder pain are decreased by 75%    Status  Achieved      PT LONG TERM GOAL #3   Title  Pt will report her back pain has decreased by 50%    Baseline  pt says her back is not hurting any more after medicine from Dr. Tonita Cong    Status  Achieved      PT LONG TERM GOAL #4   Title  Pt will be ind with MLD for continued care    Baseline  Pt now has Flexitouch so does not have to do MLD     Status  Achieved            Plan - 10/18/18 1140    Clinical Impression Statement  Pt is doing very well.  She is making several positive lifestyle changes at home and is seeing positive results.  She wants to come one more time for a final measurement of her right arm once she has all her equipment.  She is waiting on one more band to place at the top of her velcro garment.     Clinical Impairments Affecting Rehab Potential  will not do bandaging     PT Frequency  2x / week    PT Duration  8 weeks    PT Treatment/Interventions  ADLs/Self Care Home Management;DME Instruction;Therapeutic exercise;Therapeutic activities;Manual techniques;Manual lymph drainage;Compression bandaging;Passive range of motion;Dry needling;Taping;Patient/family education;Orthotic Fit/Training    PT Next Visit Plan  review exercises, remeasure arms reassess goals and consider DC        Patient will benefit from skilled therapeutic intervention in order to improve the following deficits and impairments:  Decreased coordination  Visit Diagnosis: Postmastectomy lymphedema  Acute pain of right shoulder  Abnormal posture  Acute midline low back pain without sciatica     Problem List Patient Active Problem List   Diagnosis Date Noted  . MDD (major depressive disorder), recurrent episode, mild ([place]) 12/22/2017  . Osteoporosis 07/14/2017  . OA (osteoarthritis) of knee  11/03/2015  . Osteopenia 08/08/2015  . Obstructive sleep apnea 06/25/2015  . Headache 02/03/2015  . Atypical chest pain 07/15/2014  . Arthralgia 02/22/2014  . Psoriasis 02/22/2014  . Malignant neoplasm of lower-outer quadrant of right breast of female, estrogen receptor positive ([place]) 08/09/2013  . Neck pain 06/22/2013  . Left knee pain 11/29/2012  . Reactive depression (situational) 04/26/2012  . Right wrist pain 02/02/2012  . Right foot pain 10/04/2011  . Nevus 06/05/2011  . Status post total knee replacement 04/06/2011  . Overactive bladder 03/14/2011  . KNEE PAIN, LEFT 08/31/2010  . HEARING LOSS 08/17/2010  . HIRSUTISM 06/16/2009  . CYST, IDIOPATHIC 05/20/2008  . IRRITABLE BOWEL SYNDROME 04/15/2008  . Allergic asthma, mild intermittent, uncomplicated 93/[id number]/0177  . GERD 03/19/2008  . COLONIC POLYPS, HX OF 03/19/2008  . NEPHROLITHIASIS, HX OF 03/19/2008  . Hypothyroidism 03/18/2008  . Hyperlipidemia 03/18/2008  . INSOMNIA, CHRONIC 03/18/2008  . FIBROMYALGIA 03/18/2008  . Prediabetes 03/18/2008    Donato Heinz. Owens Shark PT  [place] 10/18/2018, 11:43 AM  [place], [place], [address] Phone: [phone]   Fax:  [phone]  Name: Jean Nash MRN: [id number] Date of Birth: 08-30-1941

## 2018-10-18 NOTE — Patient Instructions (Signed)
Target exercise:   For light exercise  98-105 beats per minute                                                For moderate exercise:  105-120 beats per minute  Pay attention to your rate of perceived exertion that you have at your target heart rate and use that as a guide too

## 2018-10-26 ENCOUNTER — Ambulatory Visit: Payer: Medicare HMO | Admitting: Physical Therapy

## 2018-10-26 ENCOUNTER — Encounter: Payer: Self-pay | Admitting: Physical Therapy

## 2018-10-26 DIAGNOSIS — M545 Low back pain, unspecified: Secondary | ICD-10-CM

## 2018-10-26 DIAGNOSIS — M25511 Pain in right shoulder: Secondary | ICD-10-CM | POA: Diagnosis not present

## 2018-10-26 DIAGNOSIS — I972 Postmastectomy lymphedema syndrome: Secondary | ICD-10-CM | POA: Diagnosis not present

## 2018-10-26 DIAGNOSIS — R293 Abnormal posture: Secondary | ICD-10-CM

## 2018-10-26 NOTE — Therapy (Addendum)
[employer] [place], [place], [address] Phone: [phone]   Fax:  [phone]  Physical Therapy Treatment Progress Note Reporting Period 08/24/2018 to 10/26/2018  See note below for Objective Data and Assessment of Progress/Goals.      Patient Details  Name: Jean Nash MRN: [id number] Date of Birth: [birth year] Referring Provider (PT): [name]   Encounter Date: 10/26/2018  PT End of Session - 10/26/18 1152    Visit Number  10    Number of Visits  17    Date for PT Re-Evaluation  10/24/18    PT Start Time  1100    PT Stop Time  1140    PT Time Calculation (min)  40 min    Activity Tolerance  Patient tolerated treatment well       Past Medical History:  Diagnosis Date  . Anxiety    PHOBIAS  . Arthritis   . Breast cancer ([place]) 08/08/13   right LOQ  . Cataract   . Chronic insomnia   . Cluster headaches    history of migraines / NONE FOR SEVERAL YRS  . Depression   . Fibromyalgia   . GERD (gastroesophageal reflux disease)   . H/O hiatal hernia   . History of colonic polyps   . History of transfusion 08/30/2013  . Hx of radiation therapy 10/29/13- 12/14/13   right chest wall 5040 cGy 28 sessions, right supraclavicular/axillary region 5040 cGy 28 sessions, right chest wall boost 1000 cGy 5 sessions  . Hypothyroidism   . Irritable bowel syndrome   . Kidney stone   . Lymphedema    RT ARM - WEARS SLEEVE  . Macular degeneration    hole/right eye  . Osteopenia   . Other abnormal glucose   . Other and unspecified hyperlipidemia   . Pain in joint, shoulder region   . Pneumonia 3888,2800  . Sleep apnea    USES C-PAP  . Stress incontinence, female     Past Surgical History:  Procedure Laterality Date  . ABDOMINAL HYSTERECTOMY    . APPENDECTOMY    . BILATERAL TOTAL MASTECTOMY WITH AXILLARY LYMPH NODE DISSECTION  08/30/2013   Dr Barry Dienes  . BREAST CYST ASPIRATION     9 cysts  .  CATARACT EXTRACTION, BILATERAL  2005/2007  . CHOLECYSTECTOMY    . COLONOSCOPY    . EVACUATION BREAST HEMATOMA Left 08/31/2013   Procedure: EVACUATION HEMATOMA BREAST;  Surgeon: Stark Klein, MD;  Location: [facility];  Service: General;  Laterality: Left;  . EYE SURGERY     to repair macular hole  . FOOT ARTHROPLASTY     lt   . GANGLION CYST EXCISION     rt foot  . HEMORRHOID SURGERY     03/1993  . JOINT REPLACEMENT  03/15/11   left knee replacement  . KNEE ARTHROSCOPY     /partial knee 2016/left knee 2012  . MASS EXCISION  11/04/2011   Procedure: EXCISION MASS;  Surgeon: Cammie Sickle., MD;  Location: [facility];  Service: Orthopedics;  Laterality: Right;  excisional biopsy right ulna mass  . MASTECTOMY W/ SENTINEL NODE BIOPSY Right 08/30/2013   Procedure: RIGHT  AXILLARY SENTINEL LYMPH NODE BIOPSY; Right Axillary Node Disection;  Surgeon: Stark Klein, MD;  Location: [facility];  Service: General;  Laterality: Right;  Right side nuc med 7:00   . PARTIAL KNEE ARTHROPLASTY Right 11/03/2015   Procedure: RIGHT KNEE MEDIAL UNICOMPARTMENTAL ARTHROPLASTY ;  Surgeon: Gaynelle Arabian,  MD;  Location[facility];  Service: Orthopedics;  Laterality: Right;  . SIMPLE MASTECTOMY WITH AXILLARY SENTINEL NODE BIOPSY Left 08/30/2013   Procedure: Bilateral Breast Mastectomy ;  Surgeon: Stark Klein, MD;  Location: [facility];  Service: General;  Laterality: Left;  . skin tags removed     breast, panty line, neckline  . TOE SURGERY     preventative crossover toe surg/right foot  . TOE SURGERY  2009   left foot/screw  in 2nd toe  . TONSILLECTOMY    . UPPER GASTROINTESTINAL ENDOSCOPY      There were no vitals filed for this visit.  Subjective Assessment - 10/26/18 1113    Subjective  Pt says she is "FANTASTIC"  Pt    Patient is accompained by:  Family member    Pertinent History  She had a bilateral mastectomy with a right axillary node dissection removing 16 lymph nodes and 9 of those were  positive.  She has been discharged from Dr. Jana Hakim and is no longer taking tamoxifen.  Bil TKR    Patient Stated Goals  to get stronger    Currently in Pain?  No/denies            LYMPHEDEMA/ONCOLOGY QUESTIONNAIRE - 10/26/18 1115      Right Upper Extremity Lymphedema   15 cm Proximal to Olecranon Process  34 cm    10 cm Proximal to Olecranon Process  34 cm    Olecranon Process  24 cm    15 cm Proximal to Ulnar Styloid Process  23.3 cm    10 cm Proximal to Ulnar Styloid Process  21.9 cm    Just Proximal to Ulnar Styloid Process  15.5 cm    Across Hand at [employer]  17.5 cm    At [place] of 2nd Digit  5.4 cm    At [facility] of Thumb  5.9 cm      Left Upper Extremity Lymphedema   15 cm Proximal to Olecranon Process  33.8 cm    10 cm Proximal to Olecranon Process  32.2 cm    Olecranon Process  24 cm    15 cm Proximal to Ulnar Styloid Process  23 cm    10 cm Proximal to Ulnar Styloid Process  21.5 cm    Just Proximal to Ulnar Styloid Process  15.5 cm    Across Hand at [employer]  17.5 cm    At [place] of 2nd Digit  5.5 cm           Outpatient Re[facility]24/[facility]  Lymphedema Life Impact Scale Total Score  27.94 %           [facility] Adult PT Treatment/Exercise - 10/26/18 0001      Self-Care   Self-Care  Other Self-Care Comments    Other Self-Care Comments   reviewed use of compression and ideas for how pt can be wean from 24/7 as long as she checks in frequently about how she feels and will use the compression and Flexitouch as tools to keep her lymphedema controlled.       Manual Therapy   Edema Management  called [facility] and the extension to put on the top of her juzo velcro garment is in so pt will stop by and pick it up. She should now have all her compression                PT Short Term Goals -  10/26/18 1208      PT SHORT TERM GOAL #1   Title  Pt will obtain appropriate compression garment  for the Rt UE and will be knowledgeable about use including hand piece and compression glove    Status  Achieved      PT SHORT TERM GOAL #2   Title  Pt will reports incidents of "grabbing" right shoulder pain are decreased by 50%    Status  Achieved      PT SHORT TERM GOAL #3   Title  Pt reports she is able to manage her lymphedema independently at home    Status  Achieved        PT Long Term Goals - 10/26/18 [place] #1   Title  pt will be independent in a strengthening exercise program     Status  Achieved      PT LONG TERM GOAL #2   Title  pt will report the incidents of right shoulder pain are decreased by 75%    Status  Achieved      PT LONG TERM GOAL #3   Title  Pt will report her back pain has decreased by 50%    Baseline  pt says her back is not hurting any more after medicine from Dr. Tonita Cong    Status  Achieved      PT LONG TERM GOAL #4   Title  Pt will be ind with MLD for continued care    Baseline  Pt now has Flexitouch so does not have to do MLD     Status  Achieved            Plan - 10/26/18 1152    Clinical Impression Statement  Both arms remeasured and pt only has minor increase at 10 cm prox which corresponds to the top of her velcro garment.  She will be picking up and extension today so that area should have better compression and should reduce over time.  She knows what exercises to do at home and how to progress them She is ready for discharge.      PT Treatment/Interventions  ADLs/Self Care Home Management;DME Instruction;Therapeutic exercise;Therapeutic activities;Manual techniques;Manual lymph drainage;Compression bandaging;Passive range of motion;Dry needling;Taping;Patient/family education;Orthotic Fit/Training    PT Next Visit Plan  review exercises, remeasure arms reassess goals and consider DC     PT Home Exercise Plan   Access Code: [email or web address] , Pt will ride the bike for 30 minutes 3 times a week and do strengthening 2 times a  week        Patient will benefit from skilled therapeutic intervention in order to improve the following deficits and impairments:     Visit Diagnosis: Postmastectomy lymphedema  Abnormal posture  Acute pain of right shoulder  Acute midline low back pain without sciatica     Problem List Patient Active Problem List   Diagnosis Date Noted  . MDD (major depressive disorder), recurrent episode, mild ([place]) 12/22/2017  . Osteoporosis 07/14/2017  . OA (osteoarthritis) of knee 11/03/2015  . Osteopenia 08/08/2015  . Obstructive sleep apnea 06/25/2015  . Headache 02/03/2015  . Atypical chest pain 07/15/2014  . Arthralgia 02/22/2014  . Psoriasis 02/22/2014  . Malignant neoplasm of lower-outer quadrant of right breast of female, estrogen receptor positive ([place]) 08/09/2013  . Neck pain 06/22/2013  . Left knee pain 11/29/2012  . Reactive depression (situational) 04/26/2012  . Right wrist pain 02/02/2012  . Right foot pain  10/04/2011  . Nevus 06/05/2011  . Status post total knee replacement 04/06/2011  . Overactive bladder 03/14/2011  . KNEE PAIN, LEFT 08/31/2010  . HEARING LOSS 08/17/2010  . HIRSUTISM 06/16/2009  . CYST, IDIOPATHIC 05/20/2008  . IRRITABLE BOWEL SYNDROME 04/15/2008  . Allergic asthma, mild intermittent, uncomplicated 64/69/8060  . GERD 03/19/2008  . COLONIC POLYPS, HX OF 03/19/2008  . NEPHROLITHIASIS, HX OF 03/19/2008  . Hypothyroidism 03/18/2008  . Hyperlipidemia 03/18/2008  . INSOMNIA, CHRONIC 03/18/2008  . FIBROMYALGIA 03/18/2008  . Prediabetes 03/18/2008      PHYSICAL THERAPY DISCHARGE SUMMARY  Visits from Start of Care: 10  Current functional level related to goals / functional outcomes: As above. Pt is independent in managing symptoms    Remaining deficits: Lymphedema in right arm    Education / Equipment: Home exercise, use of compression to manage lymphedema  Plan: Patient agrees to discharge.  Patient goals were met. Patient is being  discharged due to meeting the stated rehab goals.  ?????     Donato Heinz. Owens Shark PT  Norwood Levo 10/26/2018, 12:10 PM  [place], [place], [address] Phone: [phone]   Fax:  [phone]  Name: Jean Nash MRN: [id number] Date of Birth: 14-Apr-1941

## 2018-11-09 ENCOUNTER — Ambulatory Visit: Payer: Self-pay | Admitting: *Deleted

## 2018-11-09 ENCOUNTER — Encounter: Payer: Self-pay | Admitting: Adult Health

## 2018-11-09 NOTE — Telephone Encounter (Signed)
The note does not say which medications she stopped taking, so without knowing this I cannot tell her what to do

## 2018-11-09 NOTE — Telephone Encounter (Signed)
Cory, Please advise.

## 2018-11-09 NOTE — Telephone Encounter (Signed)
Pt called with complaints of shaking and vomiting which end 11/08/18; her husband states that she stopped taking her medication cold [place] 4 days ago 11/05/18, and they are not sure what to take and the sequencing; nurse triage initiated and recommendations made per  Protocol; the best contact number is [phone] and a detailed message can be left at this number; will route to office for final disposition.  Reason for Disposition . Caller has URGENT medication question about med that PCP prescribed and triager unable to answer question  Answer Assessment - Initial Assessment Questions 1. SYMPTOMS: "Do you have any symptoms?"     No  2. SEVERITY: If symptoms are present, ask "Are they mild, moderate or severe?"     no  Protocols used: MEDICATION QUESTION CALL-A-AH

## 2018-11-09 NOTE — Telephone Encounter (Signed)
Restart all these  medications, some of these medications such as her psychiatric medications can have serious withdrawal symptoms if stopped cold [place] and other medications are life long medications that we cannot stop.   If she would like to discuss which medications we can try and wean off then have her make an appointment

## 2018-11-09 NOTE — Telephone Encounter (Signed)
Spoke with patients husband. He stated that 4 days ago patient stopped taking ALL medications. Patients husband stated " all she does is lay in bed and stare at the ceiling, she wont eat, drink or anything."

## 2018-11-09 NOTE — Telephone Encounter (Signed)
Spoke with patients husband and he stated she has an appointment tomorrow. He stated he would start her back on the levothyroxine and depression medications until the appointment tomorrow morning. He stated that Jean Nash says that "she is not in any pain or discomfort except feeling like she got ran over by a bus."  Will send to [facility] as [place]

## 2018-11-10 ENCOUNTER — Ambulatory Visit (INDEPENDENT_AMBULATORY_CARE_PROVIDER_SITE_OTHER): Payer: Medicare HMO | Admitting: Adult Health

## 2018-11-10 ENCOUNTER — Encounter: Payer: Self-pay | Admitting: Adult Health

## 2018-11-10 VITALS — BP 110/80 | HR 82 | Temp 97.4°F | Resp 16 | Ht 59.0 in | Wt 146.0 lb

## 2018-11-10 DIAGNOSIS — F13939 Sedative, hypnotic or anxiolytic use, unspecified with withdrawal, unspecified: Secondary | ICD-10-CM

## 2018-11-10 DIAGNOSIS — F13239 Sedative, hypnotic or anxiolytic dependence with withdrawal, unspecified: Secondary | ICD-10-CM | POA: Diagnosis not present

## 2018-11-10 DIAGNOSIS — R69 Illness, unspecified: Secondary | ICD-10-CM | POA: Diagnosis not present

## 2018-11-10 DIAGNOSIS — R110 Nausea: Secondary | ICD-10-CM | POA: Diagnosis not present

## 2018-11-10 MED ORDER — ONDANSETRON HCL 4 MG/2ML IJ SOLN
4.0000 mg | Freq: Once | INTRAMUSCULAR | Status: AC
  Administered 2018-11-10: 4 mg via INTRAMUSCULAR

## 2018-11-10 MED ORDER — ONDANSETRON HCL 4 MG PO TABS: 4.0000 mg | ORAL_TABLET | Freq: Three times a day (TID) | ORAL | 0 refills | Status: DC | PRN

## 2018-11-10 NOTE — Progress Notes (Signed)
Subjective:    [name], female    DOB: 02/09/1941, 77 y.o.   MRN: [id number]  HPI 77 year old female who  has a past medical history of Anxiety, Arthritis, Breast cancer ([place]) (08/08/13), Cataract, Chronic insomnia, Cluster headaches, Depression, Fibromyalgia, GERD (gastroesophageal reflux disease), H/O hiatal hernia, History of colonic polyps, History of transfusion (08/30/2013), radiation therapy (10/29/13- 12/14/13), Hypothyroidism, Irritable bowel syndrome, Kidney stone, Lymphedema, Macular degeneration, Osteopenia, Other abnormal glucose, Other and unspecified hyperlipidemia, Pain in joint, shoulder region, Pneumonia (8338,2505), Sleep apnea, and Stress incontinence, female.  She presents with her husband for an acute issue of nausea without vomiting, dry heaving, chills, fevers, body aches, fatigue, and shaking. She reports that she had forgotten to take ALL of her medications for the last five days. This includes Effexor XR and Lyrica.    Review of Systems See HPI   Past Medical History:  Diagnosis Date  . Anxiety    PHOBIAS  . Arthritis   . Breast cancer ([place]) 08/08/13   right LOQ  . Cataract   . Chronic insomnia   . Cluster headaches    history of migraines / NONE FOR SEVERAL YRS  . Depression   . Fibromyalgia   . GERD (gastroesophageal reflux disease)   . H/O hiatal hernia   . History of colonic polyps   . History of transfusion 08/30/2013  . Hx of radiation therapy 10/29/13- 12/14/13   right chest wall 5040 cGy 28 sessions, right supraclavicular/axillary region 5040 cGy 28 sessions, right chest wall boost 1000 cGy 5 sessions  . Hypothyroidism   . Irritable bowel syndrome   . Kidney stone   . Lymphedema    RT ARM - WEARS SLEEVE  . Macular degeneration    hole/right eye  . Osteopenia   . Other abnormal glucose   . Other and unspecified hyperlipidemia   . Pain in joint, shoulder region   . Pneumonia 3976,7341  . Sleep apnea    USES C-PAP  .  Stress incontinence, female     Social History   Socioeconomic History  . Marital status: Married    Spouse name: Not on file  . Number of children: 3  . Years of education: Not on file  . Highest education level: Not on file  Occupational History  . Occupation: retired Medical laboratory scientific officer  . Financial resource strain: Not on file  . Food insecurity:    Worry: Not on file    Inability: Not on file  . Transportation needs:    Medical: Not on file    Non-medical: Not on file  Tobacco Use  . Smoking status: Former Smoker    Packs/day: 0.10    Years: 2.00    Pack years: 0.20    Types: Cigarettes    Start date: 11/16/1959    Last attempt to quit: 11/15/1960    Years since quitting: 58.0  . Smokeless tobacco: Never Used  Substance and Sexual Activity  . Alcohol use: No    Alcohol/week: 0.0 standard drinks  . Drug use: No  . Sexual activity: Not on file    Comment: menarche age 3, fist live birth 36, P 3, hysterectomy age 40, no HRT, BCP 2 yrs  Lifestyle  . Physical activity:    Days per week: Not on file    Minutes per session: Not on file  . Stress: Not on file  Relationships  . Social connections:    Talks on phone: Not on  file    Gets together: Not on file    Attends religious service: Not on file    Active member of club or organization: Not on file    Attends meetings of clubs or organizations: Not on file    Relationship status: Not on file  . Intimate partner violence:    Fear of current or ex partner: Not on file    Emotionally abused: Not on file    Physically abused: Not on file    Forced sexual activity: Not on file  Other Topics Concern  . 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 ABDOMINAL HYSTERECTOMY    . APPENDECTOMY    . BILATERAL TOTAL MASTECTOMY WITH AXILLARY LYMPH NODE  DISSECTION  08/30/2013   Dr Barry Dienes  . BREAST CYST ASPIRATION     9 cysts  . CATARACT EXTRACTION, BILATERAL  2005/2007  . CHOLECYSTECTOMY    . COLONOSCOPY    . EVACUATION BREAST HEMATOMA Left 08/31/2013   Procedure: EVACUATION HEMATOMA BREAST;  Surgeon: Stark Klein, MD;  Location: [facility];  Service: General;  Laterality: Left;  . EYE SURGERY     to repair macular hole  . FOOT ARTHROPLASTY     lt   . GANGLION CYST EXCISION     rt foot  . HEMORRHOID SURGERY     03/1993  . JOINT REPLACEMENT  03/15/11   left knee replacement  . KNEE ARTHROSCOPY     /partial knee 2016/left knee 2012  . MASS EXCISION  11/04/2011   Procedure: EXCISION MASS;  Surgeon: Cammie Sickle., MD;  Location: [facility];  Service: Orthopedics;  Laterality: Right;  excisional biopsy right ulna mass  . MASTECTOMY W/ SENTINEL NODE BIOPSY Right 08/30/2013   Procedure: RIGHT  AXILLARY SENTINEL LYMPH NODE BIOPSY; Right Axillary Node Disection;  Surgeon: Stark Klein, MD;  Location: [facility];  Service: General;  Laterality: Right;  Right side nuc med 7:00   . PARTIAL KNEE ARTHROPLASTY Right 11/03/2015   Procedure: RIGHT KNEE MEDIAL UNICOMPARTMENTAL ARTHROPLASTY ;  Surgeon: Gaynelle Arabian, MD;  [facility];  Service: Orthopedics;  Laterality: Right;  . SIMPLE MASTECTOMY WITH AXILLARY SENTINEL NODE BIOPSY Left 08/30/2013   Procedure: Bilateral Breast Mastectomy ;  Surgeon: Stark Klein, MD;  Location: [facility];  Service: General;  Laterality: Left;  . skin tags removed     breast, panty line, neckline  . TOE SURGERY     preventative crossover toe surg/right foot  . TOE SURGERY  2009   left foot/screw  in 2nd toe  . TONSILLECTOMY    . UPPER GASTROINTESTINAL ENDOSCOPY      Family History  Problem Relation Age of Onset  . Stroke Mother        died age 43  . Diabetes Mother   . Breast cancer Mother 53  . Breast cancer Sister 24  . Breast cancer Paternal Aunt 13  . Diabetes Maternal Grandfather   .  Breast cancer Paternal Grandmother 92  . Breast cancer Paternal Aunt        dx in her 60s  . Cancer Maternal Grandmother        intra-abdominal cancer  . Brain cancer Maternal Uncle 8  . Brain cancer Cousin  50       maternal cousin  . Brain cancer Cousin 20       paternal cousin  . Colon cancer Neg Hx     No Known Allergies  Current Outpatient Medications on File Prior to Visit  Medication Sig Dispense Refill  . Calcium Carb-Cholecalciferol (CALCIUM-VITAMIN D) 500-400 MG-UNIT TABS Take by mouth 2 (two) times daily.     . cholecalciferol (VITAMIN D) 1000 units tablet Take 2,000 Units by mouth daily.    . diclofenac sodium (VOLTAREN) 1 % GEL as needed.     Marland Kitchen esomeprazole (NEXIUM) 40 MG capsule Take 1 capsule by mouth  daily 90 capsule 3  . levothyroxine (SYNTHROID, LEVOTHROID) 100 MCG tablet Take 1 tablet (100 mcg total) by mouth daily before breakfast. Take one pill in middle of night 90 tablet 3  . LUTEIN PO Take by mouth.    . meloxicam (MOBIC) 7.5 MG tablet Take 1 tablet (7.5 mg total) by mouth 2 (two) times daily as needed for pain. 90 tablet 1  . omega-3 acid ethyl esters (LOVAZA) 1 g capsule Take 1 capsule (1 g total) by mouth 2 (two) times daily. 180 capsule 3  . polyethylene glycol (MIRALAX / GLYCOLAX) packet Take 17 g by mouth daily.    . pregabalin (LYRICA) 75 MG capsule Take 1 capsule (75 mg total) by mouth 2 (two) times daily. 180 capsule 0  . saccharomyces boulardii (FLORASTOR) 250 MG capsule Take 250 mg by mouth daily.    . simvastatin (ZOCOR) 10 MG tablet Take 1 tablet (10 mg total) by mouth at bedtime. 90 tablet 3  . TURMERIC PO Take 1,000 mg by mouth daily.    Marland Kitchen umeclidinium-vilanterol (ANORO ELLIPTA) 62.5-25 MCG/INH AEPB Inhale 1 puff into the lungs daily. 14 each 0  . venlafaxine XR (EFFEXOR-XR) 150 MG 24 hr capsule Take 1 capsule (150 mg total) by mouth daily. 90 capsule 1   No current facility-administered medications on file prior to visit.     BP 110/80 (BP  Location: Left Arm, Patient Position: Sitting, Cuff Size: Normal)   Pulse 82   Temp (!) 97.4 F (36.3 C) (Oral)   Resp 16   Ht 4\' 11"  (1.499 m)   Wt 146 lb (66.2 kg)   SpO2 97%   BMI 29.49 kg/m       Objective:   Physical Exam Vitals signs and nursing note reviewed.  Constitutional:      Appearance: Normal appearance. She is ill-appearing and diaphoretic.  Eyes:     Extraocular Movements: Extraocular movements intact.     Conjunctiva/sclera: Conjunctivae normal.     Pupils: Pupils are equal, round, and reactive to light.  Cardiovascular:     Rate and Rhythm: Normal rate and regular rhythm.     Pulses: Normal pulses.     Heart sounds: Normal heart sounds.  Pulmonary:     Effort: Pulmonary effort is normal.     Breath sounds: Normal breath sounds.  Musculoskeletal: Normal range of motion.        General: No swelling or tenderness.  Neurological:     General: No focal deficit present.     Mental Status: She is alert and oriented to person, place, and time.       Assessment & Plan:  1. Nausea  - ondansetron (ZOFRAN) injection 4 mg  2. Withdrawal from sedative, hypnotic, or anxiolytic drug ([place]) - Reviewed medication list with patient and her husband in detail. She is to restart  her medications. She has valium at home that she takes for flying, she can take one tab of this when she gets home to help with her symptoms.  - Zofran sent to pharmacy  - Follow up if needed - Advised to go to the ER with worsening symptoms   [name], NP

## 2018-11-10 NOTE — Patient Instructions (Signed)
I am sorry you are feeling this way   Please go home and restart medications as directed.   I have sent in Zofran to help with the nausea   Restart Effexor this morning as well as Lyrica twice a day   Take Nexium for your stomach   Take Synthroid in the morning

## 2018-11-11 ENCOUNTER — Encounter: Payer: Self-pay | Admitting: Adult Health

## 2018-11-12 DIAGNOSIS — I890 Lymphedema, not elsewhere classified: Secondary | ICD-10-CM | POA: Diagnosis not present

## 2018-11-14 ENCOUNTER — Encounter: Payer: Self-pay | Admitting: Adult Health

## 2018-11-14 DIAGNOSIS — I972 Postmastectomy lymphedema syndrome: Secondary | ICD-10-CM | POA: Diagnosis not present

## 2018-11-16 ENCOUNTER — Encounter: Payer: Self-pay | Admitting: Adult Health

## 2018-12-04 ENCOUNTER — Ambulatory Visit (INDEPENDENT_AMBULATORY_CARE_PROVIDER_SITE_OTHER): Payer: Medicare HMO | Admitting: Internal Medicine

## 2018-12-04 ENCOUNTER — Encounter: Payer: Self-pay | Admitting: Internal Medicine

## 2018-12-04 VITALS — BP 116/78 | HR 65 | Ht 58.25 in | Wt 159.0 lb

## 2018-12-04 DIAGNOSIS — Z923 Personal history of irradiation: Secondary | ICD-10-CM

## 2018-12-04 DIAGNOSIS — G4733 Obstructive sleep apnea (adult) (pediatric): Secondary | ICD-10-CM

## 2018-12-04 DIAGNOSIS — J8410 Pulmonary fibrosis, unspecified: Secondary | ICD-10-CM

## 2018-12-04 NOTE — Assessment & Plan Note (Addendum)
It can take several years for fibrosis to develop after radiation therapy. We will recheck CT in 6 months, looking for progression suggestive of aggressive interstitial lung disease.

## 2018-12-04 NOTE — Assessment & Plan Note (Signed)
Solved with weight loss.  This problem can be resolved.

## 2018-12-04 NOTE — Progress Notes (Signed)
HPI female nonsmoker followed for OSA, insomnia, complicated by hypothyroid, asthma, GERD, psoriasis, CA L breast/ biMastectomy Unattended Home Sleep Test 03/06/2015- severe obstructive sleep apnea/hypopnea syndrome, AHI 44.9 per hour with desaturation to 74% and mean saturation only 89% on room air. Body weight 190 pounds   [place] 4/24  -----------------------------------------------------------------------------------  06/01/2018- 78 year old female nonsmoker followed for OSA, insomnia, complicated by hypothyroid, asthma, GERD, psoriasis, CA L breast CPAP auto 8-15/[facility]  -----Hasnt been wearing her cpap because she feels she has been getting more sleep without it, based on her FitBit results On aggressive keto diet and increased exercise supervised by her husband she is lost 28 pounds.  Husband says she snores very much less.  She stopped CPAP a month ago saying that her Fitbit documents better sleep since she stopped using CPAP. Additional problem: Persistent dry cough has gotten more obvious in the past month or 2.  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 Additional problem: Pending ultrasound for right anterior cervical node which was acutely painful recently, improved. CXR 02/26/2018- Minimal atelectasis in the left base.  No other acute abnormalities. CTa chest / abd- 02/26/18- Mediastinum/Nodes: Midline trachea. Coarse calcification right lobe of thyroid. No significantly enlarged lymph nodes. Esophagus within normal limits Lungs/Pleura: Mild right apical and right greater than left anterior subpleural fibrosis. No acute consolidation or effusion. No Pneumothorax.  ( Had XRT)  12/04/2018- 78 year old female nonsmoker followed for OSA, insomnia, complicated by hypothyroid, asthma, GERD, psoriasis, CA L breast CPAP auto 8-15/[facility] - dc 'd with weight loss Weight loss 188 lbs- now 159 lbs.  -----OSA: Pt is down in weight since last visit  and states no issues with sleep-husband states she is sleepig well too.  She has dieted off significant weight.  Husband confirms she no longer snores at all and seems to sleep very nicely.  She feels rested. Breathing is comfortable with occasional very minor cough.  She attributes dyspnea on exertion to deconditioning. Wears a lymphedema sleeve on her right arm following breast cancer treatment.  ROS-see HPI   + = positive Constitutional:   + diet weight loss, night sweats, fevers, chills, fatigue, lassitude. HEENT:    headaches, difficulty swallowing, +tooth/dental problems, sore throat,       sneezing, itching, ear ache, nasal congestion, post nasal drip, snoring CV:    chest pain, orthopnea, PND, swelling in lower extremities, anasarca,                                                dizziness, palpitations Resp:   +shortness of breath with exertion or at rest.                productive cough,  + non-productive cough, coughing up of blood.              change in color of mucus.  wheezing.   Skin:    rash or lesions. GI:     +heartburn, indigestion, abdominal pain, nausea, vomiting, diarrhea,                 change in bowel habits, loss of appetite GU: dysuria, change in color of urine, no urgency or frequency.   flank pain. MS:   +joint pain, stiffness, decreased range of motion, back pain. Neuro-     nothing unusual Psych:  change in mood or affect.  depression or anxiety.   memory loss.  OBJ- Physical Exam General- Alert, Oriented, Affect-appropriate, Distress- none acute, + obese Skin- rash-none, lesions- none, excoriation- none Lymphadenopathy- none Head- atraumatic            Eyes- Gross vision intact, PERRLA, conjunctivae and secretions clear            Ears- Hearing, canals-normal            Nose- Clear, no-Septal dev, mucus, polyps, erosion, perforation             Throat- Mallampati III-IV , mucosa clear , drainage- none, tonsils- atrophic Neck- flexible , trachea midline,  no stridor , thyroid nl, carotid no bruit Chest - symmetrical excursion , unlabored           Heart/CV- RRR , no murmur , no gallop  , no rub, nl s1 s2                           - JVD- none , edema- none, stasis changes- none, varices- none           Lung- +trace basilar crackles,  wheeze- none, cough - none , dullness-none, rub- none           Chest wall- + bilateral mastectomy Abd-  Br/ Gen/ Rectal- Not done, not indicated Extrem- cyanosis- none, clubbing, none, atrophy- none, strength- nl, + R arm elastic sleeve Neuro- grossly intact to observation

## 2018-12-04 NOTE — Patient Instructions (Signed)
Order- schedule CT chest High Resolution future to be done in June, 2020   Dx radiation fibrosis   Please call if we can help

## 2018-12-12 DIAGNOSIS — H1013 Acute atopic conjunctivitis, bilateral: Secondary | ICD-10-CM | POA: Diagnosis not present

## 2018-12-13 DIAGNOSIS — I890 Lymphedema, not elsewhere classified: Secondary | ICD-10-CM | POA: Diagnosis not present

## 2018-12-20 ENCOUNTER — Encounter: Payer: Self-pay | Admitting: Adult Health

## 2019-01-03 ENCOUNTER — Encounter: Payer: Self-pay | Admitting: Adult Health

## 2019-01-03 ENCOUNTER — Ambulatory Visit (INDEPENDENT_AMBULATORY_CARE_PROVIDER_SITE_OTHER): Payer: Medicare HMO | Admitting: Adult Health

## 2019-01-03 ENCOUNTER — Ambulatory Visit (INDEPENDENT_AMBULATORY_CARE_PROVIDER_SITE_OTHER): Payer: Medicare HMO

## 2019-01-03 VITALS — BP 130/76 | Temp 98.1°F | Wt 157.0 lb

## 2019-01-03 DIAGNOSIS — M79642 Pain in left hand: Secondary | ICD-10-CM | POA: Diagnosis not present

## 2019-01-03 DIAGNOSIS — M1990 Unspecified osteoarthritis, unspecified site: Secondary | ICD-10-CM | POA: Diagnosis not present

## 2019-01-03 DIAGNOSIS — R51 Headache: Secondary | ICD-10-CM | POA: Diagnosis not present

## 2019-01-03 DIAGNOSIS — M2550 Pain in unspecified joint: Secondary | ICD-10-CM | POA: Diagnosis not present

## 2019-01-03 DIAGNOSIS — R519 Headache, unspecified: Secondary | ICD-10-CM

## 2019-01-03 LAB — C-REACTIVE PROTEIN: CRP: <1 mg/dL (ref 0.5–20.0)

## 2019-01-03 LAB — SEDIMENTATION RATE: Sed Rate: 15 mm/hr (ref 0–30)

## 2019-01-03 NOTE — Progress Notes (Signed)
Subjective:    [name], female    DOB: [birth year], 78 y.o.   MRN: [id number]  HPI  78 year old female who  has a past medical history of Anxiety, Arthritis, Breast cancer ([place]) (08/08/13), Cataract, Chronic insomnia, Cluster headaches, Depression, Fibromyalgia, GERD (gastroesophageal reflux disease), H/O hiatal hernia, History of colonic polyps, History of transfusion (08/30/2013), radiation therapy (10/29/13- 12/14/13), Hypothyroidism, Irritable bowel syndrome, Kidney stone, Lymphedema, Macular degeneration, Osteopenia, Other abnormal glucose, Other and unspecified hyperlipidemia, Pain in joint, shoulder region, Pneumonia (8756,4332), Sleep apnea, and Stress incontinence, female.  Presents to the office today for chronic issue.  He has a history of fibromyalgia and questionable rheumatoid arthritis.  She reports being seen by a rheumatologist "maybe 20 years ago" while living in [place].  Today her biggest complaint is that of chronic joint pain, specifically in bilateral thumbs.  She does have other pain located in shoulder, bilateral ankles, and knees.  This pain is intermittent and is not daily.  The pain in her thumbs seems to be happening more daily and worse in the morning when she awakens.  She does do a lot of knitting during the day but reports no discomfort while knitting.  Pain is felt to be "aching", she does report decreased grip strength and feels like she is unable to hold onto items.  Denies any discomfort elsewhere in the hands  Been exercising as much as she has in the past, she does get relief from discomfort with exercise  Also needs a refill of Fioricet that she takes infrequently for migraines. Last refill was in 2018  Review of Systems  See HPI   Past Medical History:  Diagnosis Date  . Anxiety    PHOBIAS  . Arthritis   . Breast cancer ([place]) 08/08/13   right LOQ  . Cataract   . Chronic insomnia   . Cluster headaches    history of migraines / NONE  FOR SEVERAL YRS  . Depression   . Fibromyalgia   . GERD (gastroesophageal reflux disease)   . H/O hiatal hernia   . History of colonic polyps   . History of transfusion 08/30/2013  . Hx of radiation therapy 10/29/13- 12/14/13   right chest wall 5040 cGy 28 sessions, right supraclavicular/axillary region 5040 cGy 28 sessions, right chest wall boost 1000 cGy 5 sessions  . Hypothyroidism   . Irritable bowel syndrome   . Kidney stone   . Lymphedema    RT ARM - WEARS SLEEVE  . Macular degeneration    hole/right eye  . Osteopenia   . Other abnormal glucose   . Other and unspecified hyperlipidemia   . Pain in joint, shoulder region   . Pneumonia 9518,8416  . Sleep apnea    USES C-PAP  . Stress incontinence, female     Social History   Socioeconomic History  . Marital status: Married    Spouse name: Not on file  . Number of children: 3  . Years of education: Not on file  . Highest education level: Not on file  Occupational History  . Occupation: retired Medical laboratory scientific officer  . Financial resource strain: Not on file  . Food insecurity:    Worry: Not on file    Inability: Not on file  . Transportation needs:    Medical: Not on file    Non-medical: Not on file  Tobacco Use  . Smoking status: Former Smoker    Packs/day: 0.10    Years:  2.00    Pack years: 0.20    Types: Cigarettes    Start date: 11/16/1959    Last attempt to quit: 11/15/1960    Years since quitting: 58.1  . Smokeless tobacco: Never Used  Substance and Sexual Activity  . Alcohol use: No    Alcohol/week: 0.0 standard drinks  . Drug use: No  . Sexual activity: Not on file    Comment: menarche age 41, fist live birth 56, P 3, hysterectomy age 67, no HRT, BCP 2 yrs  Lifestyle  . Physical activity:    Days per week: Not on file    Minutes per session: Not on file  . Stress: Not on file  Relationships  . Social connections:    Talks on phone: Not on file    Gets together: Not on file    Attends  religious service: Not on file    Active member of club or organization: Not on file    Attends meetings of clubs or organizations: Not on file    Relationship status: Not on file  . Intimate partner violence:    Fear of current or ex partner: Not on file    Emotionally abused: Not on file    Physically abused: Not on file    Forced sexual activity: Not on file  Other Topics Concern  . 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 ABDOMINAL HYSTERECTOMY    . APPENDECTOMY    . BILATERAL TOTAL MASTECTOMY WITH AXILLARY LYMPH NODE DISSECTION  08/30/2013   Dr Barry Dienes  . BREAST CYST ASPIRATION     9 cysts  . CATARACT EXTRACTION, BILATERAL  2005/2007  . CHOLECYSTECTOMY    . COLONOSCOPY    . EVACUATION BREAST HEMATOMA Left 08/31/2013   Procedure: EVACUATION HEMATOMA BREAST;  Surgeon: Stark Klein, MD;  Location: [facility];  Service: General;  Laterality: Left;  . EYE SURGERY     to repair macular hole  . FOOT ARTHROPLASTY     lt   . GANGLION CYST EXCISION     rt foot  . HEMORRHOID SURGERY     03/1993  . JOINT REPLACEMENT  03/15/11   left knee replacement  . KNEE ARTHROSCOPY     /partial knee 2016/left knee 2012  . MASS EXCISION  11/04/2011   Procedure: EXCISION MASS;  Surgeon: Cammie Sickle., MD;  Location: [facility];  Service: Orthopedics;  Laterality: Right;  excisional biopsy right ulna mass  . MASTECTOMY W/ SENTINEL NODE BIOPSY Right 08/30/2013   Procedure: RIGHT  AXILLARY SENTINEL LYMPH NODE BIOPSY; Right Axillary Node Disection;  Surgeon: Stark Klein, MD;  Location: [facility];  Service: General;  Laterality: Right;  Right side nuc med 7:00   . PARTIAL KNEE ARTHROPLASTY Right 11/03/2015   Procedure: RIGHT KNEE MEDIAL UNICOMPARTMENTAL ARTHROPLASTY ;  Surgeon: Gaynelle Arabian, MD;  Location: [facility];  Service:  Orthopedics;  Laterality: Right;  . SIMPLE MASTECTOMY WITH AXILLARY SENTINEL NODE BIOPSY Left 08/30/2013   Procedure: Bilateral Breast Mastectomy ;  Surgeon: Stark Klein, MD;  Location: [facility];  Service: General;  Laterality: Left;  . skin tags removed     breast, panty line, neckline  . TOE SURGERY     preventative crossover toe  surg/right foot  . TOE SURGERY  2009   left foot/screw  in 2nd toe  . TONSILLECTOMY    . UPPER GASTROINTESTINAL ENDOSCOPY      Family History  Problem Relation Age of Onset  . Stroke Mother        died age 27  . Diabetes Mother   . Breast cancer Mother 45  . Breast cancer Sister 48  . Breast cancer Paternal Aunt 57  . Diabetes Maternal Grandfather   . Breast cancer Paternal Grandmother 43  . Breast cancer Paternal Aunt        dx in her 6s  . Cancer Maternal Grandmother        intra-abdominal cancer  . Brain cancer Maternal Uncle 8  . Brain cancer Cousin 17       maternal cousin  . Brain cancer Cousin 20       paternal cousin  . Colon cancer Neg Hx     No Known Allergies  Current Outpatient Medications on File Prior to Visit  Medication Sig Dispense Refill  . Calcium Carb-Cholecalciferol (CALCIUM-VITAMIN D) 500-400 MG-UNIT TABS Take by mouth 2 (two) times daily.     . cholecalciferol (VITAMIN D) 1000 units tablet Take 2,000 Units by mouth daily.    . diclofenac sodium (VOLTAREN) 1 % GEL as needed.     Marland Kitchen esomeprazole (NEXIUM) 40 MG capsule Take 1 capsule by mouth  daily 90 capsule 3  . levothyroxine (SYNTHROID, LEVOTHROID) 100 MCG tablet Take 1 tablet (100 mcg total) by mouth daily before breakfast. Take one pill in middle of night 90 tablet 3  . LUTEIN PO Take by mouth.    . meloxicam (MOBIC) 7.5 MG tablet Take 1 tablet (7.5 mg total) by mouth 2 (two) times daily as needed for pain. 90 tablet 1  . omega-3 acid ethyl esters (LOVAZA) 1 g capsule Take 1 capsule (1 g total) by mouth 2 (two) times daily. 180 capsule 3  . ondansetron (ZOFRAN) 4 MG  tablet Take 1 tablet (4 mg total) by mouth every 8 (eight) hours as needed for nausea or vomiting. 20 tablet 0  . polyethylene glycol (MIRALAX / GLYCOLAX) packet Take 17 g by mouth daily.    . pregabalin (LYRICA) 75 MG capsule Take 1 capsule (75 mg total) by mouth 2 (two) times daily. 180 capsule 0  . saccharomyces boulardii (FLORASTOR) 250 MG capsule Take 250 mg by mouth daily.    . simvastatin (ZOCOR) 10 MG tablet Take 1 tablet (10 mg total) by mouth at bedtime. 90 tablet 3  . TURMERIC PO Take 1,000 mg by mouth daily.    Marland Kitchen venlafaxine XR (EFFEXOR-XR) 150 MG 24 hr capsule Take 1 capsule (150 mg total) by mouth daily. 90 capsule 1   No current facility-administered medications on file prior to visit.     BP 130/76   Temp 98.1 F (36.7 C)   Wt 157 lb (71.2 kg)   BMI 32.53 kg/m       Objective:   Physical Exam Vitals signs and nursing note reviewed.  Constitutional:      Appearance: Normal appearance.  Neck:     Musculoskeletal: Normal range of motion and neck supple.  Cardiovascular:     Rate and Rhythm: Normal rate and regular rhythm.     Pulses: Normal pulses.     Heart sounds: Normal heart sounds.  Pulmonary:     Effort: Pulmonary effort is normal.     Breath sounds: Normal breath  sounds.  Musculoskeletal: Normal range of motion.        General: Tenderness present. No swelling.     Right lower leg: No edema.     Left lower leg: No edema.     Comments: Pain with palpation along bilateral CMC and trapezium No nodules felt.  No redness, warmth, or swelling noted  Tenderness to right ankle, bilateral knee, left shoulder   Skin:    General: Skin is warm and dry.  Neurological:     General: No focal deficit present.     Mental Status: She is alert and oriented to person, place, and time.     Comments: 4/5 grip strength bilaterally.   Psychiatric:        Mood and Affect: Mood normal.        Behavior: Behavior normal.        Thought Content: Thought content normal.         Judgment: Judgment normal.       Assessment & Plan:  1. Polyarthralgia -Check labs, consider referral to rheumatology.  Advised to work on exercising more frequently.  May need to cut back on knitting do to repetitive motion injury - ANA - Rheumatoid Factor - C-reactive Protein - Sedimentation Rate - DG Hand Complete Left; Future  2. Acute intractable headache, unspecified headache type  - butalbital-acetaminophen-caffeine (FIORICET, ESGIC) 50-325-40 MG tablet; Take 1-2 tablets by mouth every 6 (six) hours as needed for headache.  Dispense: 20 tablet; Refill: 0  [name], NP

## 2019-01-04 ENCOUNTER — Encounter: Payer: Self-pay | Admitting: Adult Health

## 2019-01-04 LAB — RHEUMATOID FACTOR: Rheumatoid fact SerPl-aCnc: <14 IU/mL (ref <14)

## 2019-01-04 LAB — ANA: ANA: NEGATIVE

## 2019-01-04 MED ORDER — BUTALBITAL-APAP-CAFFEINE 50-325-40 MG PO TABS: 1.0000 | ORAL_TABLET | Freq: Four times a day (QID) | ORAL | 0 refills | Status: DC | PRN

## 2019-01-05 ENCOUNTER — Other Ambulatory Visit: Payer: Self-pay | Admitting: Adult Health

## 2019-01-05 DIAGNOSIS — M2550 Pain in unspecified joint: Secondary | ICD-10-CM

## 2019-01-13 DIAGNOSIS — I890 Lymphedema, not elsewhere classified: Secondary | ICD-10-CM | POA: Diagnosis not present

## 2019-01-18 ENCOUNTER — Ambulatory Visit (HOSPITAL_COMMUNITY): Payer: Medicare HMO | Admitting: Psychiatry

## 2019-01-23 ENCOUNTER — Ambulatory Visit (HOSPITAL_COMMUNITY): Payer: Medicare HMO | Admitting: Psychiatry

## 2019-01-24 NOTE — Progress Notes (Deleted)
BH MD/PA/NP OP Progress Note  01/24/2019 3:43 PM Jean Nash  MRN:  [id number]  Chief Complaint:  HPI: *** Visit Diagnosis: No diagnosis found.  Past Psychiatric History: Please see initial evaluation for full details. I have reviewed the history. No updates at this time.     Past Medical History:  Past Medical History:  Diagnosis Date  . Anxiety    PHOBIAS  . Arthritis   . Breast cancer ([place]) 08/08/13   right LOQ  . Cataract   . Chronic insomnia   . Cluster headaches    history of migraines / NONE FOR SEVERAL YRS  . Depression   . Fibromyalgia   . GERD (gastroesophageal reflux disease)   . H/O hiatal hernia   . History of colonic polyps   . History of transfusion 08/30/2013  . Hx of radiation therapy 10/29/13- 12/14/13   right chest wall 5040 cGy 28 sessions, right supraclavicular/axillary region 5040 cGy 28 sessions, right chest wall boost 1000 cGy 5 sessions  . Hypothyroidism   . Irritable bowel syndrome   . Kidney stone   . Lymphedema    RT ARM - WEARS SLEEVE  . Macular degeneration    hole/right eye  . Osteopenia   . Other abnormal glucose   . Other and unspecified hyperlipidemia   . Pain in joint, shoulder region   . Pneumonia 7829,5621  . Sleep apnea    USES C-PAP  . Stress incontinence, female     Past Surgical History:  Procedure Laterality Date  . ABDOMINAL HYSTERECTOMY    . APPENDECTOMY    . BILATERAL TOTAL MASTECTOMY WITH AXILLARY LYMPH NODE DISSECTION  08/30/2013   Dr Barry Dienes  . BREAST CYST ASPIRATION     9 cysts  . CATARACT EXTRACTION, BILATERAL  2005/2007  . CHOLECYSTECTOMY    . COLONOSCOPY    . EVACUATION BREAST HEMATOMA Left 08/31/2013   Procedure: EVACUATION HEMATOMA BREAST;  Surgeon: Stark Klein, MD;  Location: [place];  Service: General;  Laterality: Left;  . EYE SURGERY     to repair macular hole  . FOOT ARTHROPLASTY     lt   . GANGLION CYST EXCISION     rt foot  . HEMORRHOID SURGERY     03/1993  . JOINT REPLACEMENT  03/15/11    left knee replacement  . KNEE ARTHROSCOPY     /partial knee 2016/left knee 2012  . MASS EXCISION  11/04/2011   Procedure: EXCISION MASS;  Surgeon: Cammie Sickle., MD;  Location: [facility];  Service: Orthopedics;  Laterality: Right;  excisional biopsy right ulna mass  . MASTECTOMY W/ SENTINEL NODE BIOPSY Right 08/30/2013   Procedure: RIGHT  AXILLARY SENTINEL LYMPH NODE BIOPSY; Right Axillary Node Disection;  Surgeon: Stark Klein, MD;  Location: [place];  Service: General;  Laterality: Right;  Right side nuc med 7:00   . PARTIAL KNEE ARTHROPLASTY Right 11/03/2015   Procedure: RIGHT KNEE MEDIAL UNICOMPARTMENTAL ARTHROPLASTY ;  Surgeon: Gaynelle Arabian, MD;  Location: [facility];  Service: Orthopedics;  Laterality: Right;  . SIMPLE MASTECTOMY WITH AXILLARY SENTINEL NODE BIOPSY Left 08/30/2013   Procedure: Bilateral Breast Mastectomy ;  Surgeon: Stark Klein, MD;  Location: [facility];  Service: General;  Laterality: Left;  . skin tags removed     breast, panty line, neckline  . TOE SURGERY     preventative crossover toe surg/right foot  . TOE SURGERY  2009   left foot/screw  in 2nd toe  . TONSILLECTOMY    .  UPPER GASTROINTESTINAL ENDOSCOPY      Family Psychiatric History: Please see initial evaluation for full details. I have reviewed the history. No updates at this time.     Family History:  Family History  Problem Relation Age of Onset  . Stroke Mother        died age 58  . Diabetes Mother   . Breast cancer Mother 46  . Breast cancer Sister 63  . Breast cancer Paternal Aunt 74  . Diabetes Maternal Grandfather   . Breast cancer Paternal Grandmother 32  . Breast cancer Paternal Aunt        dx in her 63s  . Cancer Maternal Grandmother        intra-abdominal cancer  . Brain cancer Maternal Uncle 8  . Brain cancer Cousin 54       maternal cousin  . Brain cancer Cousin 20       paternal cousin  . Colon cancer Neg Hx     Social History:  Social History    Socioeconomic History  . Marital status: Married    Spouse name: Not on file  . Number of children: 3  . Years of education: Not on file  . Highest education level: Not on file  Occupational History  . Occupation: retired Medical laboratory scientific officer  . Financial resource strain: Not on file  . Food insecurity:    Worry: Not on file    Inability: Not on file  . Transportation needs:    Medical: Not on file    Non-medical: Not on file  Tobacco Use  . Smoking status: Former Smoker    Packs/day: 0.10    Years: 2.00    Pack years: 0.20    Types: Cigarettes    Start date: 11/16/1959    Last attempt to quit: 11/15/1960    Years since quitting: 58.2  . Smokeless tobacco: Never Used  Substance and Sexual Activity  . Alcohol use: No    Alcohol/week: 0.0 standard drinks  . Drug use: No  . Sexual activity: Not on file    Comment: menarche age 36, fist live birth 78, P 3, hysterectomy age 48, no HRT, BCP 2 yrs  Lifestyle  . Physical activity:    Days per week: Not on file    Minutes per session: Not on file  . Stress: Not on file  Relationships  . Social connections:    Talks on phone: Not on file    Gets together: Not on file    Attends religious service: Not on file    Active member of club or organization: Not on file    Attends meetings of clubs or organizations: Not on file    Relationship status: Not on file  Other Topics Concern  . Not on file  Social History Narrative   Occupation:  Retired Radiation protection practitioner    Married with 3 grown children      Never Smoked     Alcohol use-no         Allergies: No Known Allergies  Metabolic Disorder Labs: Lab Results  Component Value Date   HGBA1C 6.0 07/14/2017   MPG 126 (H) 08/27/2013   MPG 117 (H) 06/22/2013   Lab Results  Component Value Date   PROLACTIN 3.9 07/22/2009   Lab Results  Component Value Date   CHOL 164 07/20/2018   TRIG 105.0 07/20/2018   HDL 46.50 07/20/2018   CHOLHDL 4 07/20/2018   VLDL 21.0 07/20/2018    [place]  96 07/20/2018   LDLCALC 105 (H) 07/14/2017   Lab Results  Component Value Date   TSH 0.84 07/20/2018   TSH 0.75 07/14/2017    Therapeutic Level Labs: No results found for: LITHIUM No results found for: VALPROATE No components found for:  CBMZ  Current Medications: Current Outpatient Medications  Medication Sig Dispense Refill  . Biotin 1000 MCG CHEW Chew by mouth.    . butalbital-acetaminophen-caffeine (FIORICET, ESGIC) 50-325-40 MG tablet Take 1-2 tablets by mouth every 6 (six) hours as needed for headache. 20 tablet 0  . Calcium Carb-Cholecalciferol (CALCIUM-VITAMIN D) 500-400 MG-UNIT TABS Take by mouth 2 (two) times daily.     . cholecalciferol (VITAMIN D) 1000 units tablet Take 2,000 Units by mouth daily.    Marland Kitchen co-enzyme Q-10 30 MG capsule Take 300 mg by mouth daily.    . diclofenac sodium (VOLTAREN) 1 % GEL as needed.     Marland Kitchen esomeprazole (NEXIUM) 40 MG capsule Take 1 capsule by mouth  daily 90 capsule 3  . levothyroxine (SYNTHROID, LEVOTHROID) 100 MCG tablet Take 1 tablet (100 mcg total) by mouth daily before breakfast. Take one pill in middle of night 90 tablet 3  . LUTEIN PO Take by mouth.    . meloxicam (MOBIC) 7.5 MG tablet Take 1 tablet (7.5 mg total) by mouth 2 (two) times daily as needed for pain. 90 tablet 1  . methocarbamol (ROBAXIN) 500 MG tablet Take 500 mg by mouth as needed for muscle spasms.    Marland Kitchen omega-3 acid ethyl esters (LOVAZA) 1 g capsule Take 1 capsule (1 g total) by mouth 2 (two) times daily. 180 capsule 3  . ondansetron (ZOFRAN) 4 MG tablet Take 1 tablet (4 mg total) by mouth every 8 (eight) hours as needed for nausea or vomiting. 20 tablet 0  . oxyCODONE-acetaminophen (PERCOCET) 5-325 MG tablet Take by mouth as needed for severe pain.    . polyethylene glycol (MIRALAX / GLYCOLAX) packet Take 17 g by mouth daily.    . pregabalin (LYRICA) 75 MG capsule Take 1 capsule (75 mg total) by mouth 2 (two) times daily. 180 capsule 0  . saccharomyces boulardii  (FLORASTOR) 250 MG capsule Take 250 mg by mouth daily.    . simvastatin (ZOCOR) 10 MG tablet Take 1 tablet (10 mg total) by mouth at bedtime. 90 tablet 3  . temazepam (RESTORIL) 15 MG capsule Take 15 mg by mouth at bedtime as needed for sleep.    . traMADol (ULTRAM) 50 MG tablet Take by mouth as needed for moderate pain.    . TURMERIC PO Take 1,000 mg by mouth daily.    Marland Kitchen venlafaxine XR (EFFEXOR-XR) 150 MG 24 hr capsule Take 1 capsule (150 mg total) by mouth daily. 90 capsule 1   No current facility-administered medications for this visit.      Musculoskeletal: Strength & Muscle Tone: within normal limits Gait & Station: normal Patient leans: N/A  Psychiatric Specialty Exam: ROS  There were no vitals taken for this visit.There is no height or weight on file to calculate BMI.  General Appearance: Fairly Groomed  Eye Contact:  Good  Speech:  Clear and Coherent  Volume:  Normal  Mood:  {BHH MOOD:22306}  Affect:  {Affect (PAA):22687}  Thought Process:  Coherent  Orientation:  Full (Time, Place, and Person)  Thought Content: Logical   Suicidal Thoughts:  {ST/HT (PAA):22692}  Homicidal Thoughts:  {ST/HT (PAA):22692}  Memory:  Immediate;   Good  Judgement:  {Judgement (PAA):22694}  Insight:  {  Insight (PAA):22695}  Psychomotor Activity:  Normal  Concentration:  Concentration: Good and Attention Span: Good  Recall:  Good  Fund of Knowledge: Good  Language: Good  Akathisia:  No  Handed:  Right  AIMS (if indicated): not done  Assets:  Communication Skills Desire for Improvement  ADL's:  Intact  Cognition: WNL  Sleep:  {BHH GOOD/FAIR/POOR:22877}   Screenings: PHQ2-9     Office Visit from 07/20/2018 in [place] at [facility] from 07/14/2017 in [place] at [facility] from 11/28/2015 in [place] at [facility] from 10/14/2014 in [place] at [facility] from 10/01/2013 in [place] at  [facility] Total Score  0  0  0  1  0  PHQ-9 Total Score  -  5  -  -  -       Assessment and Plan:  Jean Nash is a 78 y.o. year old female with a history of depression, anxiety,breast cancer s/p mastectomy , who presents for follow up appointment for No diagnosis found.  # MDD, mild, recurrent without psychotic features  Although there was a few hours of feeling depressed yesterday, she denies any other significant mood symptoms since her last appointment.  Will continue current dose of Effexor to target depression.  Discussed risk of hypertension.  Discussed behavioral activation.  Will consider tapering down of Effexor at the next visit if she denies significant mood symptoms (patient has a history of depression, although she has not tried psychotropics in the past. )  Plan  1. Continue Effexor 150 mg daily  2. Return to clinic infourmonths for 15 mins  The patient demonstrates the following risk factors for suicide: Chronic risk factors for suicide include:psychiatric disorder ofdepression. Acute risk factorsfor suicide include: N/A. Protective factorsfor this patient include: positive social support, coping skills and hope for the future. Considering these factors, the overall suicide risk at this point appears to below. Patientisappropriate for outpatient follow up.  Norman Clay, MD 01/24/2019, 3:43 PM

## 2019-01-30 ENCOUNTER — Ambulatory Visit (HOSPITAL_COMMUNITY): Payer: Medicare HMO | Admitting: Psychiatry

## 2019-02-11 DIAGNOSIS — I890 Lymphedema, not elsewhere classified: Secondary | ICD-10-CM | POA: Diagnosis not present

## 2019-02-15 ENCOUNTER — Ambulatory Visit: Payer: Medicare HMO | Admitting: Rheumatology

## 2019-02-16 ENCOUNTER — Ambulatory Visit: Payer: Medicare HMO | Admitting: Rheumatology

## 2019-02-20 ENCOUNTER — Other Ambulatory Visit: Payer: Self-pay | Admitting: Adult Health

## 2019-02-20 DIAGNOSIS — Z760 Encounter for issue of repeat prescription: Secondary | ICD-10-CM

## 2019-03-01 NOTE — Progress Notes (Deleted)
BH MD/PA/NP OP Progress Note  03/01/2019 12:39 PM Bijou Easler  MRN:  [id number]  Chief Complaint:  HPI: *** Visit Diagnosis: No diagnosis found.  Past Psychiatric History: Please see initial evaluation for full details. I have reviewed the history. No updates at this time.     Past Medical History:  Past Medical History:  Diagnosis Date  . Anxiety    PHOBIAS  . Arthritis   . Breast cancer ([place]) 08/08/13   right LOQ  . Cataract   . Chronic insomnia   . Cluster headaches    history of migraines / NONE FOR SEVERAL YRS  . Depression   . Fibromyalgia   . GERD (gastroesophageal reflux disease)   . H/O hiatal hernia   . History of colonic polyps   . History of transfusion 08/30/2013  . Hx of radiation therapy 10/29/13- 12/14/13   right chest wall 5040 cGy 28 sessions, right supraclavicular/axillary region 5040 cGy 28 sessions, right chest wall boost 1000 cGy 5 sessions  . Hypothyroidism   . Irritable bowel syndrome   . Kidney stone   . Lymphedema    RT ARM - WEARS SLEEVE  . Macular degeneration    hole/right eye  . Osteopenia   . Other abnormal glucose   . Other and unspecified hyperlipidemia   . Pain in joint, shoulder region   . Pneumonia 3329,5188  . Sleep apnea    USES C-PAP  . Stress incontinence, female     Past Surgical History:  Procedure Laterality Date  . ABDOMINAL HYSTERECTOMY    . APPENDECTOMY    . BILATERAL TOTAL MASTECTOMY WITH AXILLARY LYMPH NODE DISSECTION  08/30/2013   Dr Barry Dienes  . BREAST CYST ASPIRATION     9 cysts  . CATARACT EXTRACTION, BILATERAL  2005/2007  . CHOLECYSTECTOMY    . COLONOSCOPY    . EVACUATION BREAST HEMATOMA Left 08/31/2013   Procedure: EVACUATION HEMATOMA BREAST;  Surgeon: Stark Klein, MD;  Location: [facility];  Service: General;  Laterality: Left;  . EYE SURGERY     to repair macular hole  . FOOT ARTHROPLASTY     lt   . GANGLION CYST EXCISION     rt foot  . HEMORRHOID SURGERY     03/1993  . JOINT REPLACEMENT  03/15/11    left knee replacement  . KNEE ARTHROSCOPY     /partial knee 2016/left knee 2012  . MASS EXCISION  11/04/2011   Procedure: EXCISION MASS;  Surgeon: Cammie Sickle., MD;  Location: [facility];  Service: Orthopedics;  Laterality: Right;  excisional biopsy right ulna mass  . MASTECTOMY W/ SENTINEL NODE BIOPSY Right 08/30/2013   Procedure: RIGHT  AXILLARY SENTINEL LYMPH NODE BIOPSY; Right Axillary Node Disection;  Surgeon: Stark Klein, MD;  Location: [facility];  Service: General;  Laterality: Right;  Right side nuc med 7:00   . PARTIAL KNEE ARTHROPLASTY Right 11/03/2015   Procedure: RIGHT KNEE MEDIAL UNICOMPARTMENTAL ARTHROPLASTY ;  Surgeon: Gaynelle Arabian, MD;  Location[facility];  Service: Orthopedics;  Laterality: Right;  . SIMPLE MASTECTOMY WITH AXILLARY SENTINEL NODE BIOPSY Left 08/30/2013   Procedure: Bilateral Breast Mastectomy ;  Surgeon: Stark Klein, MD;  Location: [facility];  Service: General;  Laterality: Left;  . skin tags removed     breast, panty line, neckline  . TOE SURGERY     preventative crossover toe surg/right foot  . TOE SURGERY  2009   left foot/screw  in 2nd toe  . TONSILLECTOMY    .  UPPER GASTROINTESTINAL ENDOSCOPY      Family Psychiatric History: Please see initial evaluation for full details. I have reviewed the history. No updates at this time.     Family History:  Family History  Problem Relation Age of Onset  . Stroke Mother        died age 16  . Diabetes Mother   . Breast cancer Mother 28  . Breast cancer Sister 50  . Breast cancer Paternal Aunt 63  . Diabetes Maternal Grandfather   . Breast cancer Paternal Grandmother 56  . Breast cancer Paternal Aunt        dx in her 53s  . Cancer Maternal Grandmother        intra-abdominal cancer  . Brain cancer Maternal Uncle 8  . Brain cancer Cousin 75       maternal cousin  . Brain cancer Cousin 20       paternal cousin  . Colon cancer Neg Hx     Social History:  Social History    Socioeconomic History  . Marital status: Married    Spouse name: Not on file  . Number of children: 3  . Years of education: Not on file  . Highest education level: Not on file  Occupational History  . Occupation: retired Medical laboratory scientific officer  . Financial resource strain: Not on file  . Food insecurity:    Worry: Not on file    Inability: Not on file  . Transportation needs:    Medical: Not on file    Non-medical: Not on file  Tobacco Use  . Smoking status: Former Smoker    Packs/day: 0.10    Years: 2.00    Pack years: 0.20    Types: Cigarettes    Start date: 11/16/1959    Last attempt to quit: 11/15/1960    Years since quitting: 58.3  . Smokeless tobacco: Never Used  Substance and Sexual Activity  . Alcohol use: No    Alcohol/week: 0.0 standard drinks  . Drug use: No  . Sexual activity: Not on file    Comment: menarche age 80, fist live birth 30, P 3, hysterectomy age 49, no HRT, BCP 2 yrs  Lifestyle  . Physical activity:    Days per week: Not on file    Minutes per session: Not on file  . Stress: Not on file  Relationships  . Social connections:    Talks on phone: Not on file    Gets together: Not on file    Attends religious service: Not on file    Active member of club or organization: Not on file    Attends meetings of clubs or organizations: Not on file    Relationship status: Not on file  Other Topics Concern  . Not on file  Social History Narrative   Occupation:  Retired Radiation protection practitioner    Married with 3 grown children      Never Smoked     Alcohol use-no         Allergies: No Known Allergies  Metabolic Disorder Labs: Lab Results  Component Value Date   HGBA1C 6.0 07/14/2017   MPG 126 (H) 08/27/2013   MPG 117 (H) 06/22/2013   Lab Results  Component Value Date   PROLACTIN 3.9 07/22/2009   Lab Results  Component Value Date   CHOL 164 07/20/2018   TRIG 105.0 07/20/2018   HDL 46.50 07/20/2018   CHOLHDL 4 07/20/2018   VLDL 21.0 07/20/2018    [place]  96 07/20/2018   LDLCALC 105 (H) 07/14/2017   Lab Results  Component Value Date   TSH 0.84 07/20/2018   TSH 0.75 07/14/2017    Therapeutic Level Labs: No results found for: LITHIUM No results found for: VALPROATE No components found for:  CBMZ  Current Medications: Current Outpatient Medications  Medication Sig Dispense Refill  . Biotin 1000 MCG CHEW Chew by mouth.    . butalbital-acetaminophen-caffeine (FIORICET, ESGIC) 50-325-40 MG tablet Take 1-2 tablets by mouth every 6 (six) hours as needed for headache. 20 tablet 0  . Calcium Carb-Cholecalciferol (CALCIUM-VITAMIN D) 500-400 MG-UNIT TABS Take by mouth 2 (two) times daily.     . cholecalciferol (VITAMIN D) 1000 units tablet Take 2,000 Units by mouth daily.    Marland Kitchen co-enzyme Q-10 30 MG capsule Take 300 mg by mouth daily.    . diclofenac sodium (VOLTAREN) 1 % GEL as needed.     Marland Kitchen esomeprazole (NEXIUM) 40 MG capsule Take 1 capsule by mouth  daily 90 capsule 3  . levothyroxine (SYNTHROID, LEVOTHROID) 100 MCG tablet Take 1 tablet (100 mcg total) by mouth daily before breakfast. Take one pill in middle of night 90 tablet 3  . LUTEIN PO Take by mouth.    . meloxicam (MOBIC) 7.5 MG tablet Take 1 tablet by mouth twice daily as needed for pain 90 tablet 1  . methocarbamol (ROBAXIN) 500 MG tablet Take 500 mg by mouth as needed for muscle spasms.    Marland Kitchen omega-3 acid ethyl esters (LOVAZA) 1 g capsule Take 1 capsule (1 g total) by mouth 2 (two) times daily. 180 capsule 3  . ondansetron (ZOFRAN) 4 MG tablet Take 1 tablet (4 mg total) by mouth every 8 (eight) hours as needed for nausea or vomiting. 20 tablet 0  . oxyCODONE-acetaminophen (PERCOCET) 5-325 MG tablet Take by mouth as needed for severe pain.    . polyethylene glycol (MIRALAX / GLYCOLAX) packet Take 17 g by mouth daily.    . pregabalin (LYRICA) 75 MG capsule Take 1 capsule by mouth twice daily 180 capsule 0  . saccharomyces boulardii (FLORASTOR) 250 MG capsule Take 250 mg by mouth  daily.    . simvastatin (ZOCOR) 10 MG tablet Take 1 tablet (10 mg total) by mouth at bedtime. 90 tablet 3  . temazepam (RESTORIL) 15 MG capsule Take 15 mg by mouth at bedtime as needed for sleep.    . traMADol (ULTRAM) 50 MG tablet Take by mouth as needed for moderate pain.    . TURMERIC PO Take 1,000 mg by mouth daily.    Marland Kitchen venlafaxine XR (EFFEXOR-XR) 150 MG 24 hr capsule Take 1 capsule (150 mg total) by mouth daily. 90 capsule 1   No current facility-administered medications for this visit.      Musculoskeletal: Strength & Muscle Tone: N/A Gait & Station: N/A Patient leans: N/A  Psychiatric Specialty Exam: ROS  There were no vitals taken for this visit.There is no height or weight on file to calculate BMI.  General Appearance: NA  Eye Contact:  NA  Speech:  {Speech:22685}  Volume:  {Volume (PAA):22686}  Mood:  {BHH MOOD:22306}  Affect:  {Affect (PAA):22687}  Thought Process:  Coherent  Orientation:  Full (Time, Place, and Person)  Thought Content: Logical   Suicidal Thoughts:  {ST/HT (PAA):22692}  Homicidal Thoughts:  {ST/HT (PAA):22692}  Memory:  Immediate;   Good  Judgement:  {Judgement (PAA):22694}  Insight:  {Insight (PAA):22695}  Psychomotor Activity:  Normal  Concentration:  Concentration: Good and  Attention Span: Good  Recall:  Good  Fund of Knowledge: Good  Language: Good  Akathisia:  No  Handed:  Right  AIMS (if indicated): not done  Assets:  Communication Skills Desire for Improvement  ADL's:  Intact  Cognition: WNL  Sleep:  {BHH GOOD/FAIR/POOR:22877}   Screenings: PHQ2-9     Office Visit from 07/20/2018 in [place] at [facility] from 07/14/2017 in [place] at [facility] from 11/28/2015 in [place] at [facility] from 10/14/2014 in [place] at [facility] from 10/01/2013 in [place] at [facility] Total Score  0  0  0  1  0  PHQ-9 Total Score  -  5  -  -   -       Assessment and Plan:  Paulyne Mooty is a 78 y.o. year old female with a history of depression, anxiety, breast cancer s/p mastectomy, , who presents for follow up appointment for No diagnosis found.  # MDD, mild, recurrent without psychotic features  Although there was a few hours of feeling depressed yesterday, she denies any other significant mood symptoms since her last appointment.  Will continue current dose of Effexor to target depression.  Discussed risk of hypertension.  Discussed behavioral activation.  Will consider tapering down of Effexor at the next visit if she denies significant mood symptoms (patient has a history of depression, although she has not tried psychotropics in the past. )  Plan  1. Continue Effexor 150 mg daily  2. Return to clinic infourmonths for 15 mins  The patient demonstrates the following risk factors for suicide: Chronic risk factors for suicide include:psychiatric disorder ofdepression. Acute risk factorsfor suicide include: N/A. Protective factorsfor this patient include: positive social support, coping skills and hope for the future. Considering these factors, the overall suicide risk at this point appears to below. Patientisappropriate for outpatient follow up.  Norman Clay, MD 03/01/2019, 12:39 PM

## 2019-03-06 ENCOUNTER — Ambulatory Visit (HOSPITAL_COMMUNITY): Payer: Medicare HMO | Admitting: Psychiatry

## 2019-03-06 NOTE — Progress Notes (Signed)
Virtual Visit via Video Note  I connected with Jean Nash on 03/07/19 at 10:30 AM EDT by a video enabled telemedicine application and verified that I am speaking with the correct person using two identifiers.   I discussed the limitations of evaluation and management by telemedicine and the availability of in person appointments. The patient expressed understanding and agreed to proceed.   I discussed the assessment and treatment plan with the patient. The patient was provided an opportunity to ask questions and all were answered. The patient agreed with the plan and demonstrated an understanding of the instructions.   The patient was advised to call back or seek an in-person evaluation if the symptoms worsen or if the condition fails to improve as anticipated.  I provided 15 minutes of non-face-to-face time during this encounter.   Norman Clay, MD    [facility] MD/PA/NP OP Progress Note  03/07/2019 10:59 AM Jean Nash  MRN:  [id number]  Chief Complaint:  Chief Complaint    Follow-up; Depression     HPI:  This is a follow-up visit for depression.  She states that she has been doing very well.  She feels good especially because she has been able to be with her husband more frequently due to pandemic.  She has had close contact with her children.  She also has a grown up child in [place]; she was able to use them with her 12 grandchildren.  She is interested in tapering down venlafaxine.  She has insomnia, which she attributes her pattern of trying to stay awake as a child to wait for her father.  She denies feeling depressed.  She has good motivation.  She enjoys knitting, and talking with her friend on the phone.  She has good concentration.  She denies SI.  She feels anxious at times.  She denies panic attacks.  She denies irritability. She has started to cough since last night; she has a plan to see PCP this afternoon.  She grew up in [place]. Her father owned [employer], and her was at home  during the day only on Sunday. She reports good relationship with her parents. She moved to [place] 13 years ago as her husband's friends recommended the place. She loves the places so far where she can meet with people who are friendly.   Visit Diagnosis:    ICD-10-CM   1. MDD (major depressive disorder), recurrent, in full remission ([place]) F33.42     Past Psychiatric History: Please see initial evaluation for full details. I have reviewed the history. No updates at this time.     Past Medical History:  Past Medical History:  Diagnosis Date  . Anxiety    PHOBIAS  . Arthritis   . Breast cancer ([place]) 08/08/13   right LOQ  . Cataract   . Chronic insomnia   . Cluster headaches    history of migraines / NONE FOR SEVERAL YRS  . Depression   . Fibromyalgia   . GERD (gastroesophageal reflux disease)   . H/O hiatal hernia   . History of colonic polyps   . History of transfusion 08/30/2013  . Hx of radiation therapy 10/29/13- 12/14/13   right chest wall 5040 cGy 28 sessions, right supraclavicular/axillary region 5040 cGy 28 sessions, right chest wall boost 1000 cGy 5 sessions  . Hypothyroidism   . Irritable bowel syndrome   . Kidney stone   . Lymphedema    RT ARM - WEARS SLEEVE  . Macular degeneration    hole/right  eye  . Osteopenia   . Other abnormal glucose   . Other and unspecified hyperlipidemia   . Pain in joint, shoulder region   . Pneumonia 2297,9892  . Sleep apnea    USES C-PAP  . Stress incontinence, female     Past Surgical History:  Procedure Laterality Date  . ABDOMINAL HYSTERECTOMY    . APPENDECTOMY    . BILATERAL TOTAL MASTECTOMY WITH AXILLARY LYMPH NODE DISSECTION  08/30/2013   Dr Barry Dienes  . BREAST CYST ASPIRATION     9 cysts  . CATARACT EXTRACTION, BILATERAL  2005/2007  . CHOLECYSTECTOMY    . COLONOSCOPY    . EVACUATION BREAST HEMATOMA Left 08/31/2013   Procedure: EVACUATION HEMATOMA BREAST;  Surgeon: Stark Klein, MD;  Location: [place];  Service: General;   Laterality: Left;  . EYE SURGERY     to repair macular hole  . FOOT ARTHROPLASTY     lt   . GANGLION CYST EXCISION     rt foot  . HEMORRHOID SURGERY     03/1993  . JOINT REPLACEMENT  03/15/11   left knee replacement  . KNEE ARTHROSCOPY     /partial knee 2016/left knee 2012  . MASS EXCISION  11/04/2011   Procedure: EXCISION MASS;  Surgeon: Cammie Sickle., MD;  Location: [place];  Service: Orthopedics;  Laterality: Right;  excisional biopsy right ulna mass  . MASTECTOMY W/ SENTINEL NODE BIOPSY Right 08/30/2013   Procedure: RIGHT  AXILLARY SENTINEL LYMPH NODE BIOPSY; Right Axillary Node Disection;  Surgeon: Stark Klein, MD;  Location: [place];  Service: General;  Laterality: Right;  Right side nuc med 7:00   . PARTIAL KNEE ARTHROPLASTY Right 11/03/2015   Procedure: RIGHT KNEE MEDIAL UNICOMPARTMENTAL ARTHROPLASTY ;  Surgeon: Gaynelle Arabian, MD;  Location[facility];  Service: Orthopedics;  Laterality: Right;  . SIMPLE MASTECTOMY WITH AXILLARY SENTINEL NODE BIOPSY Left 08/30/2013   Procedure: Bilateral Breast Mastectomy ;  Surgeon: Stark Klein, MD;  Location: [place];  Service: General;  Laterality: Left;  . skin tags removed     breast, panty line, neckline  . TOE SURGERY     preventative crossover toe surg/right foot  . TOE SURGERY  2009   left foot/screw  in 2nd toe  . TONSILLECTOMY    . UPPER GASTROINTESTINAL ENDOSCOPY      Family Psychiatric History: Please see initial evaluation for full details. I have reviewed the history. No updates at this time.     Family History:  Family History  Problem Relation Age of Onset  . Stroke Mother        died age 23  . Diabetes Mother   . Breast cancer Mother 70  . Breast cancer Sister 67  . Breast cancer Paternal Aunt 71  . Diabetes Maternal Grandfather   . Breast cancer Paternal Grandmother 37  . Breast cancer Paternal Aunt        dx in her 59s  . Cancer Maternal Grandmother        intra-abdominal cancer  . Brain  cancer Maternal Uncle 8  . Brain cancer Cousin 56       maternal cousin  . Brain cancer Cousin 20       paternal cousin  . Colon cancer Neg Hx     Social History:  Social History   Socioeconomic History  . Marital status: Married    Spouse name: Not on file  . Number of children: 3  . Years of  education: Not on file  . Highest education level: Not on file  Occupational History  . Occupation: retired Medical laboratory scientific officer  . Financial resource strain: Not on file  . Food insecurity:    Worry: Not on file    Inability: Not on file  . Transportation needs:    Medical: Not on file    Non-medical: Not on file  Tobacco Use  . Smoking status: Former Smoker    Packs/day: 0.10    Years: 2.00    Pack years: 0.20    Types: Cigarettes    Start date: 11/16/1959    Last attempt to quit: 11/15/1960    Years since quitting: 58.3  . Smokeless tobacco: Never Used  Substance and Sexual Activity  . Alcohol use: No    Alcohol/week: 0.0 standard drinks  . Drug use: No  . Sexual activity: Not on file    Comment: menarche age 89, fist live birth 85, P 3, hysterectomy age 34, no HRT, BCP 2 yrs  Lifestyle  . Physical activity:    Days per week: Not on file    Minutes per session: Not on file  . Stress: Not on file  Relationships  . Social connections:    Talks on phone: Not on file    Gets together: Not on file    Attends religious service: Not on file    Active member of club or organization: Not on file    Attends meetings of clubs or organizations: Not on file    Relationship status: Not on file  Other Topics Concern  . Not on file  Social History Narrative   Occupation:  Retired Radiation protection practitioner    Married with 3 grown children      Never Smoked     Alcohol use-no         Allergies: No Known Allergies  Metabolic Disorder Labs: Lab Results  Component Value Date   HGBA1C 6.0 07/14/2017   MPG 126 (H) 08/27/2013   MPG 117 (H) 06/22/2013   Lab Results  Component Value Date    PROLACTIN 3.9 07/22/2009   Lab Results  Component Value Date   CHOL 164 07/20/2018   TRIG 105.0 07/20/2018   HDL 46.50 07/20/2018   CHOLHDL 4 07/20/2018   VLDL 21.0 07/20/2018   LDLCALC 96 07/20/2018   LDLCALC 105 (H) 07/14/2017   Lab Results  Component Value Date   TSH 0.84 07/20/2018   TSH 0.75 07/14/2017    Therapeutic Level Labs: No results found for: LITHIUM No results found for: VALPROATE No components found for:  CBMZ  Current Medications: Current Outpatient Medications  Medication Sig Dispense Refill  . Biotin 1000 MCG CHEW Chew by mouth.    . butalbital-acetaminophen-caffeine (FIORICET, ESGIC) 50-325-40 MG tablet Take 1-2 tablets by mouth every 6 (six) hours as needed for headache. 20 tablet 0  . Calcium Carb-Cholecalciferol (CALCIUM-VITAMIN D) 500-400 MG-UNIT TABS Take by mouth 2 (two) times daily.     . cholecalciferol (VITAMIN D) 1000 units tablet Take 2,000 Units by mouth daily.    Marland Kitchen co-enzyme Q-10 30 MG capsule Take 300 mg by mouth daily.    . diclofenac sodium (VOLTAREN) 1 % GEL as needed.     Marland Kitchen esomeprazole (NEXIUM) 40 MG capsule Take 1 capsule by mouth  daily 90 capsule 3  . levothyroxine (SYNTHROID, LEVOTHROID) 100 MCG tablet Take 1 tablet (100 mcg total) by mouth daily before breakfast. Take one pill in middle of night 90 tablet 3  .  LUTEIN PO Take by mouth.    . meloxicam (MOBIC) 7.5 MG tablet Take 1 tablet by mouth twice daily as needed for pain 90 tablet 1  . methocarbamol (ROBAXIN) 500 MG tablet Take 500 mg by mouth as needed for muscle spasms.    Marland Kitchen omega-3 acid ethyl esters (LOVAZA) 1 g capsule Take 1 capsule (1 g total) by mouth 2 (two) times daily. 180 capsule 3  . ondansetron (ZOFRAN) 4 MG tablet Take 1 tablet (4 mg total) by mouth every 8 (eight) hours as needed for nausea or vomiting. 20 tablet 0  . oxyCODONE-acetaminophen (PERCOCET) 5-325 MG tablet Take by mouth as needed for severe pain.    . polyethylene glycol (MIRALAX / GLYCOLAX) packet Take  17 g by mouth daily.    . pregabalin (LYRICA) 75 MG capsule Take 1 capsule by mouth twice daily 180 capsule 0  . saccharomyces boulardii (FLORASTOR) 250 MG capsule Take 250 mg by mouth daily.    . simvastatin (ZOCOR) 10 MG tablet Take 1 tablet (10 mg total) by mouth at bedtime. 90 tablet 3  . temazepam (RESTORIL) 15 MG capsule Take 15 mg by mouth at bedtime as needed for sleep.    . traMADol (ULTRAM) 50 MG tablet Take by mouth as needed for moderate pain.    . TURMERIC PO Take 1,000 mg by mouth daily.    Marland Kitchen venlafaxine XR (EFFEXOR-XR) 75 MG 24 hr capsule Take 1 capsule (75 mg total) by mouth daily. 90 capsule 0   No current facility-administered medications for this visit.      Musculoskeletal: Strength & Muscle Tone: N/A Gait & Station: N/A Patient leans: N/A  Psychiatric Specialty Exam: Review of Systems  Respiratory: Positive for cough.   Psychiatric/Behavioral: Negative for depression, hallucinations, memory loss, substance abuse and suicidal ideas. The patient is nervous/anxious and has insomnia.   All other systems reviewed and are negative.   There were no vitals taken for this visit.There is no height or weight on file to calculate BMI.  General Appearance: Fairly Groomed  Eye Contact:  Good  Speech:  Clear and Coherent  Volume:  Normal  Mood:  "good"  Affect:  Appropriate, Congruent and Full Range  Thought Process:  Coherent  Orientation:  Full (Time, Place, and Person)  Thought Content: Logical   Suicidal Thoughts:  No  Homicidal Thoughts:  No  Memory:  Immediate;   Good  Judgement:  Good  Insight:  Good  Psychomotor Activity:  Normal  Concentration:  Concentration: Good and Attention Span: Good  Recall:  Good  Fund of Knowledge: Good  Language: Good  Akathisia:  No  Handed:  Right  Assets:  Communication Skills Desire for Improvement  ADL's:  Intact  Cognition: WNL  Sleep:  Poor   Screenings: PHQ2-9     Office Visit from 07/20/2018 in [place]  at [facility] from 07/14/2017 in [place] at [facility] from 11/28/2015 in [place] at [facility] from 10/14/2014 in [place] at [facility] from 10/01/2013 in [place] at [facility] Total Score  0  0  0  1  0  PHQ-9 Total Score  -  5  -  -  -       Assessment and Plan:  Jean Nash is a 78 y.o. year old female with a history of depression, anxiety, breast cancer s/p mastectomy , who presents for follow up appointment for MDD (major depressive disorder), recurrent, in  full remission ([place])  # MDD, recurrent in full remission She denies significant mood symptoms except occasional anxiety since her last visit.  Although she does have history of depression, she was not treated with antidepressant before, and she has been on venlafaxine at least for several months with good response to medication.  will taper down venlafaxine, which she has been on for depression.  Discussed potential risk of discontinuation syndrome.  She is advised to contact the office if any worsening in her mood symptoms.  Discussed behavioral activation.   Plan 1. Decrease venlafaxine 75 mg daily  2. Next appointment 7/16 at 1 PM for 20 mins   The patient demonstrates the following risk factors for suicide: Chronic risk factors for suicide include:psychiatric disorder ofdepression. Acute risk factorsfor suicide include: N/A. Protective factorsfor this patient include: positive social support, coping skills and hope for the future. Considering these factors, the overall suicide risk at this point appears to below. Patientisappropriate for outpatient follow up.  Norman Clay, MD 03/07/2019, 10:59 AM

## 2019-03-07 ENCOUNTER — Ambulatory Visit (INDEPENDENT_AMBULATORY_CARE_PROVIDER_SITE_OTHER): Payer: Medicare HMO | Admitting: Psychiatry

## 2019-03-07 ENCOUNTER — Encounter (HOSPITAL_COMMUNITY): Payer: Self-pay | Admitting: Psychiatry

## 2019-03-07 ENCOUNTER — Encounter: Payer: Self-pay | Admitting: Adult Health

## 2019-03-07 ENCOUNTER — Other Ambulatory Visit: Payer: Self-pay

## 2019-03-07 ENCOUNTER — Ambulatory Visit: Payer: Self-pay | Admitting: Adult Health

## 2019-03-07 ENCOUNTER — Ambulatory Visit (INDEPENDENT_AMBULATORY_CARE_PROVIDER_SITE_OTHER): Payer: Medicare HMO | Admitting: Adult Health

## 2019-03-07 DIAGNOSIS — R05 Cough: Secondary | ICD-10-CM

## 2019-03-07 DIAGNOSIS — F3342 Major depressive disorder, recurrent, in full remission: Secondary | ICD-10-CM

## 2019-03-07 DIAGNOSIS — R059 Cough, unspecified: Secondary | ICD-10-CM

## 2019-03-07 DIAGNOSIS — R69 Illness, unspecified: Secondary | ICD-10-CM | POA: Diagnosis not present

## 2019-03-07 MED ORDER — VENLAFAXINE HCL ER 75 MG PO CP24: 75.0000 mg | ORAL_CAPSULE | Freq: Every day | ORAL | 0 refills | Status: DC

## 2019-03-07 NOTE — Patient Instructions (Signed)
1. Decrease venlafaxine 75 mg daily  2. Next appointment 7/16 at 1 PM for 20 mins

## 2019-03-07 NOTE — Telephone Encounter (Signed)
Pt scheduled to see Tommi Rumps today at 1 PM.  Nothing further needed.

## 2019-03-07 NOTE — Telephone Encounter (Signed)
Pt. And her husband reports she started coughing "non-stop last night." Non-productive cough. No shortness of breath or fever. Used her husband's inhaler x 1 last night and it "helped a little." Wheezes "some when I'm coughing."  The office is currently closed - will contact them when they open. Answer Assessment - Initial Assessment Questions 1. ONSET: "When did the cough begin?"      Last night 2. SEVERITY: "How bad is the cough today?"      Severe 3. RESPIRATORY DISTRESS: "Describe your breathing."      No distress 4. FEVER: "Do you have a fever?" If so, ask: "What is your temperature, how was it measured, and when did it start?"     No 5. HEMOPTYSIS: "Are you coughing up any blood?" If so ask: "How much?" (flecks, streaks, tablespoons, etc.)     No 6. TREATMENT: "What have you done so far to treat the cough?" (e.g., meds, fluids, humidifier)     Tried her husband inhaler 7. CARDIAC HISTORY: "Do you have any history of heart disease?" (e.g., heart attack, congestive heart failure)      No 8. LUNG HISTORY: "Do you have any history of lung disease?"  (e.g., pulmonary embolus, asthma, emphysema)     Asthma years ago 9. PE RISK FACTORS: "Do you have a history of blood clots?" (or: recent major surgery, recent prolonged travel, bedridden)     No 10. OTHER SYMPTOMS: "Do you have any other symptoms? (e.g., runny nose, wheezing, chest pain)       Wheezing when she coughs 11. PREGNANCY: "Is there any chance you are pregnant?" "When was your last menstrual period?"       No 12. TRAVEL: "Have you traveled out of the country in the last month?" (e.g., travel history, exposures)       No  Protocols used: COUGH - ACUTE NON-PRODUCTIVE-A-AH

## 2019-03-07 NOTE — Progress Notes (Signed)
Virtual Visit via Video Note  I connected with Jean Nash on 03/07/19 at  1:00 PM EDT by a video enabled telemedicine application and verified that I am speaking with the correct person using two identifiers.  Location patient: home Location provider:work or home office Persons participating in the virtual visit: patient, provider  I discussed the limitations of evaluation and management by telemedicine and the availability of in person appointments. The patient expressed understanding and agreed to proceed.   HPI: 78 year old female who is being evaluated today for cough.  She reports that the cough is been present for less than 24 hours.  Cough was at its worse last night, she used her husband's rescue inhaler which "helped out a little bit".  Throughout today the cough has been improving.  Cough is dry, nonproductive.  She denies shortness of breath, fevers, chills, or body aches.  Has not been using any over-the-counter medications.  She does have prescribed Tessalon Perles at home but has not started those yet either   ROS: See pertinent positives and negatives per HPI.  Past Medical History:  Diagnosis Date  . Anxiety    PHOBIAS  . Arthritis   . Breast cancer ([place]) 08/08/13   right LOQ  . Cataract   . Chronic insomnia   . Cluster headaches    history of migraines / NONE FOR SEVERAL YRS  . Depression   . Fibromyalgia   . GERD (gastroesophageal reflux disease)   . H/O hiatal hernia   . History of colonic polyps   . History of transfusion 08/30/2013  . Hx of radiation therapy 10/29/13- 12/14/13   right chest wall 5040 cGy 28 sessions, right supraclavicular/axillary region 5040 cGy 28 sessions, right chest wall boost 1000 cGy 5 sessions  . Hypothyroidism   . Irritable bowel syndrome   . Kidney stone   . Lymphedema    RT ARM - WEARS SLEEVE  . Macular degeneration    hole/right eye  . Osteopenia   . Other abnormal glucose   . Other and unspecified hyperlipidemia   .  Pain in joint, shoulder region   . Pneumonia 1601,0932  . Sleep apnea    USES C-PAP  . Stress incontinence, female     Past Surgical History:  Procedure Laterality Date  . ABDOMINAL HYSTERECTOMY    . APPENDECTOMY    . BILATERAL TOTAL MASTECTOMY WITH AXILLARY LYMPH NODE DISSECTION  08/30/2013   Dr Barry Dienes  . BREAST CYST ASPIRATION     9 cysts  . CATARACT EXTRACTION, BILATERAL  2005/2007  . CHOLECYSTECTOMY    . COLONOSCOPY    . EVACUATION BREAST HEMATOMA Left 08/31/2013   Procedure: EVACUATION HEMATOMA BREAST;  Surgeon: Stark Klein, MD;  Location: [facility];  Service: General;  Laterality: Left;  . EYE SURGERY     to repair macular hole  . FOOT ARTHROPLASTY     lt   . GANGLION CYST EXCISION     rt foot  . HEMORRHOID SURGERY     03/1993  . JOINT REPLACEMENT  03/15/11   left knee replacement  . KNEE ARTHROSCOPY     /partial knee 2016/left knee 2012  . MASS EXCISION  11/04/2011   Procedure: EXCISION MASS;  Surgeon: Cammie Sickle., MD;  Location: [place];  Service: Orthopedics;  Laterality: Right;  excisional biopsy right ulna mass  . MASTECTOMY W/ SENTINEL NODE BIOPSY Right 08/30/2013   Procedure: RIGHT  AXILLARY SENTINEL LYMPH NODE BIOPSY; Right Axillary Node Disection;  Surgeon: Stark Klein, MD;  Location: [place];  Service: General;  Laterality: Right;  Right side nuc med 7:00   . PARTIAL KNEE ARTHROPLASTY Right 11/03/2015   Procedure: RIGHT KNEE MEDIAL UNICOMPARTMENTAL ARTHROPLASTY ;  Surgeon: Gaynelle Arabian, MD;  [facility];  Service: Orthopedics;  Laterality: Right;  . SIMPLE MASTECTOMY WITH AXILLARY SENTINEL NODE BIOPSY Left 08/30/2013   Procedure: Bilateral Breast Mastectomy ;  Surgeon: Stark Klein, MD;  Location: [place];  Service: General;  Laterality: Left;  . skin tags removed     breast, panty line, neckline  . TOE SURGERY     preventative crossover toe surg/right foot  . TOE SURGERY  2009   left foot/screw  in 2nd toe  . TONSILLECTOMY    .  UPPER GASTROINTESTINAL ENDOSCOPY      Family History  Problem Relation Age of Onset  . Stroke Mother        died age 25  . Diabetes Mother   . Breast cancer Mother 88  . Breast cancer Sister 60  . Breast cancer Paternal Aunt 26  . Diabetes Maternal Grandfather   . Breast cancer Paternal Grandmother 9  . Breast cancer Paternal Aunt        dx in her 5s  . Cancer Maternal Grandmother        intra-abdominal cancer  . Brain cancer Maternal Uncle 8  . Brain cancer Cousin 51       maternal cousin  . Brain cancer Cousin 20       paternal cousin  . Colon cancer Neg Hx      Current Outpatient Medications:  .  Biotin 1000 MCG CHEW, Chew by mouth., Disp: , Rfl:  .  butalbital-acetaminophen-caffeine (FIORICET, ESGIC) 50-325-40 MG tablet, Take 1-2 tablets by mouth every 6 (six) hours as needed for headache., Disp: 20 tablet, Rfl: 0 .  Calcium Carb-Cholecalciferol (CALCIUM-VITAMIN D) 500-400 MG-UNIT TABS, Take by mouth 2 (two) times daily. , Disp: , Rfl:  .  cholecalciferol (VITAMIN D) 1000 units tablet, Take 2,000 Units by mouth daily., Disp: , Rfl:  .  co-enzyme Q-10 30 MG capsule, Take 300 mg by mouth daily., Disp: , Rfl:  .  diclofenac sodium (VOLTAREN) 1 % GEL, as needed. , Disp: , Rfl:  .  esomeprazole (NEXIUM) 40 MG capsule, Take 1 capsule by mouth  daily, Disp: 90 capsule, Rfl: 3 .  levothyroxine (SYNTHROID, LEVOTHROID) 100 MCG tablet, Take 1 tablet (100 mcg total) by mouth daily before breakfast. Take one pill in middle of night, Disp: 90 tablet, Rfl: 3 .  LUTEIN PO, Take by mouth., Disp: , Rfl:  .  meloxicam (MOBIC) 7.5 MG tablet, Take 1 tablet by mouth twice daily as needed for pain, Disp: 90 tablet, Rfl: 1 .  methocarbamol (ROBAXIN) 500 MG tablet, Take 500 mg by mouth as needed for muscle spasms., Disp: , Rfl:  .  omega-3 acid ethyl esters (LOVAZA) 1 g capsule, Take 1 capsule (1 g total) by mouth 2 (two) times daily., Disp: 180 capsule, Rfl: 3 .  ondansetron (ZOFRAN) 4 MG  tablet, Take 1 tablet (4 mg total) by mouth every 8 (eight) hours as needed for nausea or vomiting., Disp: 20 tablet, Rfl: 0 .  oxyCODONE-acetaminophen (PERCOCET) 5-325 MG tablet, Take by mouth as needed for severe pain., Disp: , Rfl:  .  polyethylene glycol (MIRALAX / GLYCOLAX) packet, Take 17 g by mouth daily., Disp: , Rfl:  .  pregabalin (LYRICA) 75 MG capsule, Take 1  capsule by mouth twice daily, Disp: 180 capsule, Rfl: 0 .  saccharomyces boulardii (FLORASTOR) 250 MG capsule, Take 250 mg by mouth daily., Disp: , Rfl:  .  simvastatin (ZOCOR) 10 MG tablet, Take 1 tablet (10 mg total) by mouth at bedtime., Disp: 90 tablet, Rfl: 3 .  temazepam (RESTORIL) 15 MG capsule, Take 15 mg by mouth at bedtime as needed for sleep., Disp: , Rfl:  .  traMADol (ULTRAM) 50 MG tablet, Take by mouth as needed for moderate pain., Disp: , Rfl:  .  TURMERIC PO, Take 1,000 mg by mouth daily., Disp: , Rfl:  .  venlafaxine XR (EFFEXOR-XR) 75 MG 24 hr capsule, Take 1 capsule (75 mg total) by mouth daily., Disp: 90 capsule, Rfl: 0  EXAM:  VITALS per patient if applicable: 38.3A  GENERAL: alert, oriented, appears well and in no acute distress  HEENT: atraumatic, conjunttiva clear, no obvious abnormalities on inspection of external nose and ears  NECK: normal movements of the head and neck  LUNGS: on inspection no signs of respiratory distress, breathing rate appears normal, no obvious gross SOB, gasping or wheezing  CV: no obvious cyanosis  MS: moves all visible extremities without noticeable abnormality  PSYCH/NEURO: pleasant and cooperative, no obvious depression or anxiety, speech and thought processing grossly intact  ASSESSMENT AND PLAN:  Discussed the following assessment and plan:  Cough  New cough.  Likely related to seasonal allergies or viral.  No concern for bacterial infection at this time.  She is okay to take [employer] as directed.  Follow-up if symptoms have not resolved in the next 3  to 5 days, or sooner if symptoms worsen   I discussed the assessment and treatment plan with the patient. The patient was provided an opportunity to ask questions and all were answered. The patient agreed with the plan and demonstrated an understanding of the instructions.   The patient was advised to call back or seek an in-person evaluation if the symptoms worsen or if the condition fails to improve as anticipated.   [name], NP

## 2019-03-13 ENCOUNTER — Ambulatory Visit: Payer: Medicare HMO | Admitting: Rheumatology

## 2019-03-14 DIAGNOSIS — I890 Lymphedema, not elsewhere classified: Secondary | ICD-10-CM | POA: Diagnosis not present

## 2019-03-15 IMAGING — DX DG CHEST 2V
2 series · 2 of 2 positions shown · non-contrast
Comparison: January 25, 2018

CLINICAL DATA: Mid to lower chest pain.

EXAM:
CHEST - 2 VIEW

[chest pa]
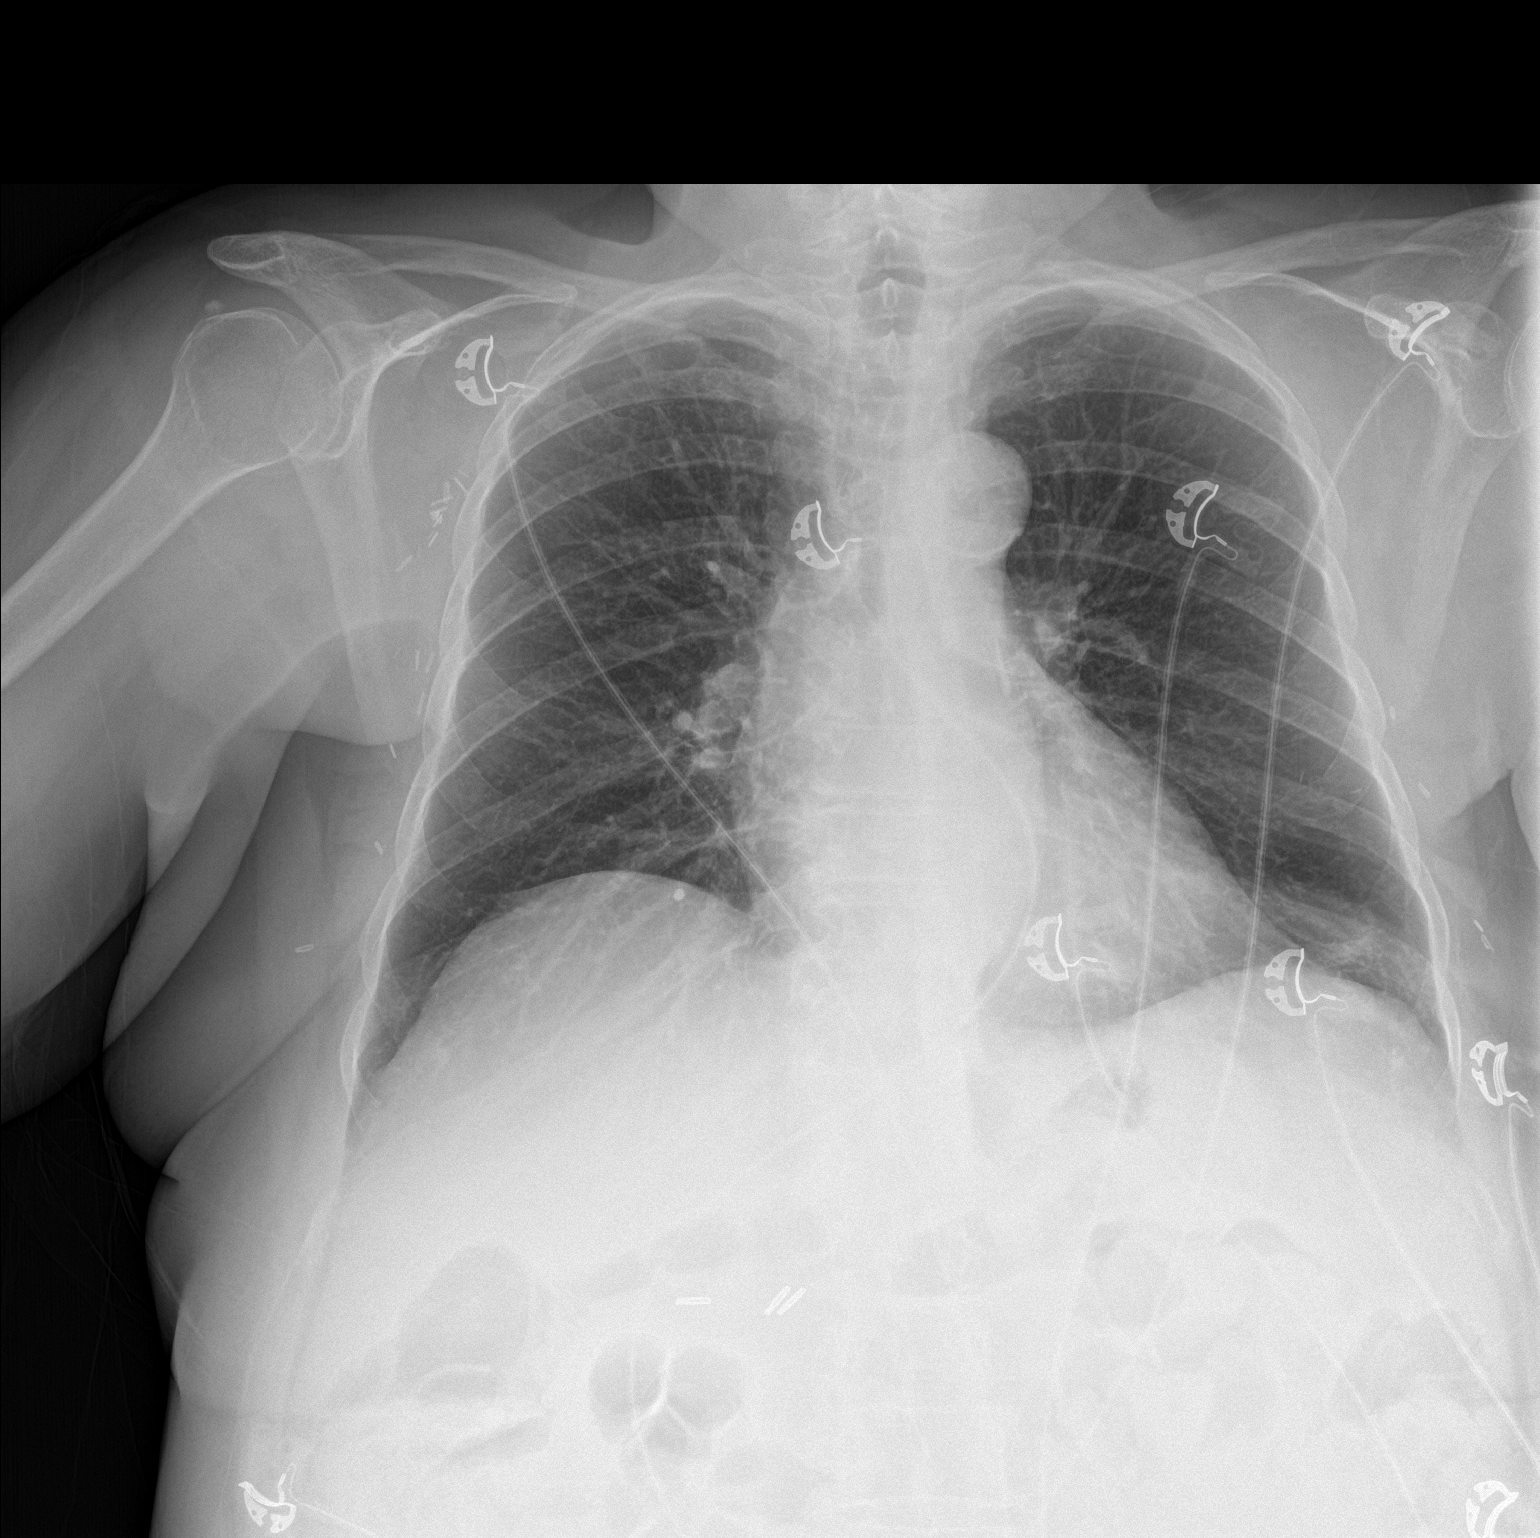

[chest lat]
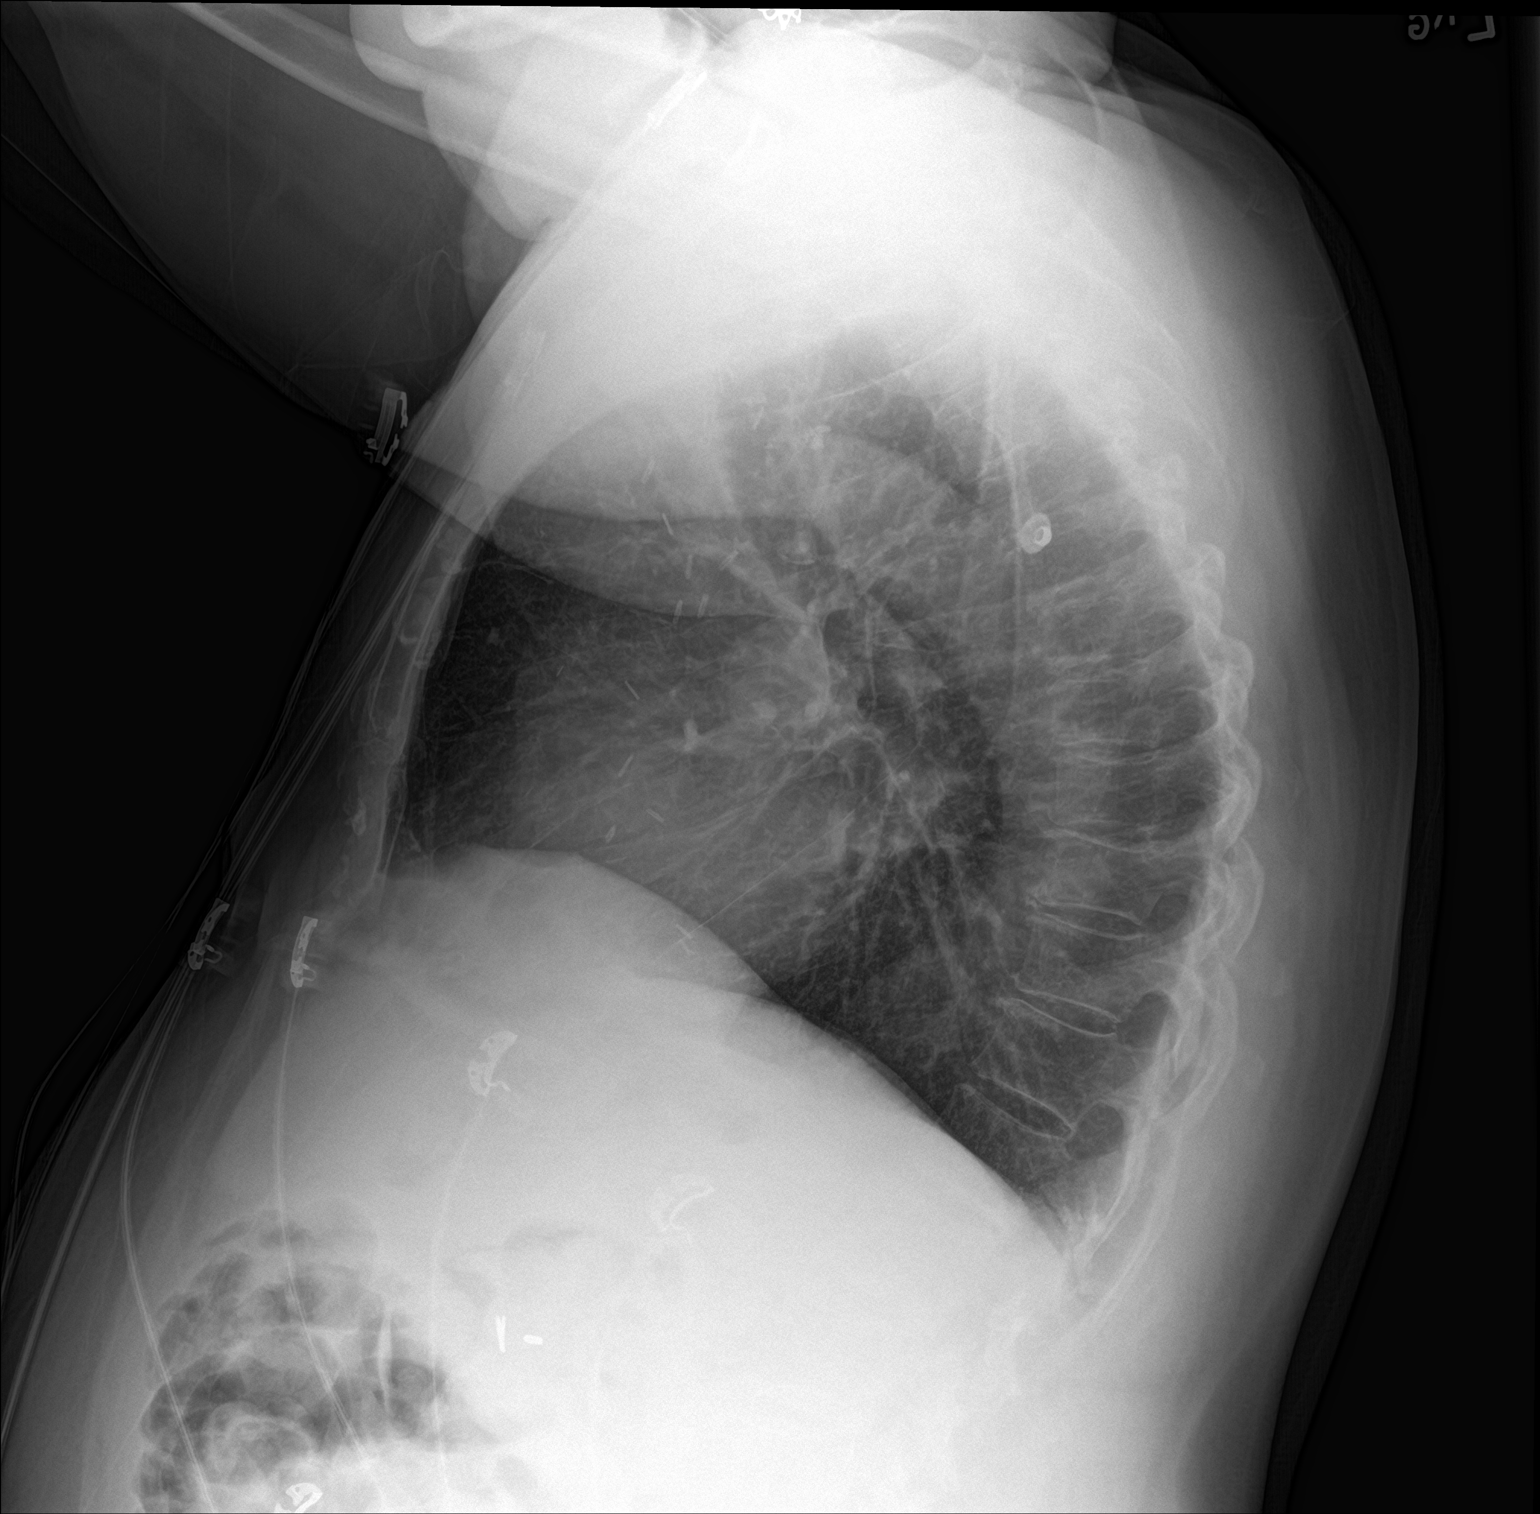

[2 of 2 positions shown; findings below may reference images not displayed]

FINDINGS: Minimal atelectasis in the left base. No pneumothorax. Previous
axillary dissection. The heart, hila, mediastinum, lungs, and pleura
are otherwise normal.
IMPRESSION: Minimal atelectasis in the left base.  No other acute abnormalities.

## 2019-03-21 ENCOUNTER — Encounter: Payer: Self-pay | Admitting: Adult Health

## 2019-03-21 ENCOUNTER — Other Ambulatory Visit: Payer: Self-pay

## 2019-03-21 ENCOUNTER — Ambulatory Visit (INDEPENDENT_AMBULATORY_CARE_PROVIDER_SITE_OTHER): Payer: Medicare HMO | Admitting: Adult Health

## 2019-03-21 DIAGNOSIS — Z8669 Personal history of other diseases of the nervous system and sense organs: Secondary | ICD-10-CM

## 2019-03-21 MED ORDER — TOPIRAMATE 25 MG PO TABS: 25.0000 mg | ORAL_TABLET | Freq: Every day | ORAL | 0 refills | Status: DC

## 2019-03-21 NOTE — Progress Notes (Signed)
Virtual Visit via Video Note  I connected with Jean Nash on 03/21/19 at  3:00 PM EDT by a video enabled telemedicine application and verified that I am speaking with the correct person using two identifiers.  Location patient: home Location provider:work or home office Persons participating in the virtual visit: patient, provider  I discussed the limitations of evaluation and management by telemedicine and the availability of in person appointments. The patient expressed understanding and agreed to proceed.   HPI: 78 year old female who is being evaluated today for chronic issue of migraine headaches.  She reports that she is experiencing her typical migraine headaches due to changes in weather.  Her migraines have been happening almost every day for the last 2 weeks.  She is taken Fioricet but reports that "this only works if it makes me fall asleep and I do not wake up with a headache".  She and her husband are both interested in finding out if she would be eligible to start 1 of the new injectable medications for migraine headaches.  She is already taking Effexor 75 mg daily for anxiety and depression but has not noticed any changes of the amount of migraine headaches she gets since being on this medication.    ROS: See pertinent positives and negatives per HPI.  Past Medical History:  Diagnosis Date  . Anxiety    PHOBIAS  . Arthritis   . Breast cancer ([place]) 08/08/13   right LOQ  . Cataract   . Chronic insomnia   . Cluster headaches    history of migraines / NONE FOR SEVERAL YRS  . Depression   . Fibromyalgia   . GERD (gastroesophageal reflux disease)   . H/O hiatal hernia   . History of colonic polyps   . History of transfusion 08/30/2013  . Hx of radiation therapy 10/29/13- 12/14/13   right chest wall 5040 cGy 28 sessions, right supraclavicular/axillary region 5040 cGy 28 sessions, right chest wall boost 1000 cGy 5 sessions  . Hypothyroidism   . Irritable bowel syndrome    . Kidney stone   . Lymphedema    RT ARM - WEARS SLEEVE  . Macular degeneration    hole/right eye  . Osteopenia   . Other abnormal glucose   . Other and unspecified hyperlipidemia   . Pain in joint, shoulder region   . Pneumonia 6063,0160  . Sleep apnea    USES C-PAP  . Stress incontinence, female     Past Surgical History:  Procedure Laterality Date  . ABDOMINAL HYSTERECTOMY    . APPENDECTOMY    . BILATERAL TOTAL MASTECTOMY WITH AXILLARY LYMPH NODE DISSECTION  08/30/2013   Dr Barry Dienes  . BREAST CYST ASPIRATION     9 cysts  . CATARACT EXTRACTION, BILATERAL  2005/2007  . CHOLECYSTECTOMY    . COLONOSCOPY    . EVACUATION BREAST HEMATOMA Left 08/31/2013   Procedure: EVACUATION HEMATOMA BREAST;  Surgeon: Stark Klein, MD;  Location: [facility];  Service: General;  Laterality: Left;  . EYE SURGERY     to repair macular hole  . FOOT ARTHROPLASTY     lt   . GANGLION CYST EXCISION     rt foot  . HEMORRHOID SURGERY     03/1993  . JOINT REPLACEMENT  03/15/11   left knee replacement  . KNEE ARTHROSCOPY     /partial knee 2016/left knee 2012  . MASS EXCISION  11/04/2011   Procedure: EXCISION MASS;  Surgeon: Cammie Sickle., MD;  [place]  [place];  Service: Orthopedics;  Laterality: Right;  excisional biopsy right ulna mass  . MASTECTOMY W/ SENTINEL NODE BIOPSY Right 08/30/2013   Procedure: RIGHT  AXILLARY SENTINEL LYMPH NODE BIOPSY; Right Axillary Node Disection;  Surgeon: Stark Klein, MD;  Location: [facility];  Service: General;  Laterality: Right;  Right side nuc med 7:00   . PARTIAL KNEE ARTHROPLASTY Right 11/03/2015   Procedure: RIGHT KNEE MEDIAL UNICOMPARTMENTAL ARTHROPLASTY ;  Surgeon: Gaynelle Arabian, MD;  Location: [facility];  Service: Orthopedics;  Laterality: Right;  . SIMPLE MASTECTOMY WITH AXILLARY SENTINEL NODE BIOPSY Left 08/30/2013   Procedure: Bilateral Breast Mastectomy ;  Surgeon: Stark Klein, MD;  Location: [facility];  Service: General;  Laterality: Left;  .  skin tags removed     breast, panty line, neckline  . TOE SURGERY     preventative crossover toe surg/right foot  . TOE SURGERY  2009   left foot/screw  in 2nd toe  . TONSILLECTOMY    . UPPER GASTROINTESTINAL ENDOSCOPY      Family History  Problem Relation Age of Onset  . Stroke Mother        died age 70  . Diabetes Mother   . Breast cancer Mother 44  . Breast cancer Sister 76  . Breast cancer Paternal Aunt 77  . Diabetes Maternal Grandfather   . Breast cancer Paternal Grandmother 69  . Breast cancer Paternal Aunt        dx in her 58s  . Cancer Maternal Grandmother        intra-abdominal cancer  . Brain cancer Maternal Uncle 8  . Brain cancer Cousin 73       maternal cousin  . Brain cancer Cousin 20       paternal cousin  . Colon cancer Neg Hx      Current Outpatient Medications:  .  Biotin 1000 MCG CHEW, Chew by mouth., Disp: , Rfl:  .  butalbital-acetaminophen-caffeine (FIORICET, ESGIC) 50-325-40 MG tablet, Take 1-2 tablets by mouth every 6 (six) hours as needed for headache., Disp: 20 tablet, Rfl: 0 .  Calcium Carb-Cholecalciferol (CALCIUM-VITAMIN D) 500-400 MG-UNIT TABS, Take by mouth 2 (two) times daily. , Disp: , Rfl:  .  cholecalciferol (VITAMIN D) 1000 units tablet, Take 2,000 Units by mouth daily., Disp: , Rfl:  .  co-enzyme Q-10 30 MG capsule, Take 300 mg by mouth daily., Disp: , Rfl:  .  diclofenac sodium (VOLTAREN) 1 % GEL, as needed. , Disp: , Rfl:  .  esomeprazole (NEXIUM) 40 MG capsule, Take 1 capsule by mouth  daily, Disp: 90 capsule, Rfl: 3 .  levothyroxine (SYNTHROID, LEVOTHROID) 100 MCG tablet, Take 1 tablet (100 mcg total) by mouth daily before breakfast. Take one pill in middle of night, Disp: 90 tablet, Rfl: 3 .  LUTEIN PO, Take by mouth., Disp: , Rfl:  .  meloxicam (MOBIC) 7.5 MG tablet, Take 1 tablet by mouth twice daily as needed for pain, Disp: 90 tablet, Rfl: 1 .  methocarbamol (ROBAXIN) 500 MG tablet, Take 500 mg by mouth as needed for muscle  spasms., Disp: , Rfl:  .  omega-3 acid ethyl esters (LOVAZA) 1 g capsule, Take 1 capsule (1 g total) by mouth 2 (two) times daily., Disp: 180 capsule, Rfl: 3 .  ondansetron (ZOFRAN) 4 MG tablet, Take 1 tablet (4 mg total) by mouth every 8 (eight) hours as needed for nausea or vomiting., Disp: 20 tablet, Rfl: 0 .  oxyCODONE-acetaminophen (PERCOCET) 5-325 MG tablet,  Take by mouth as needed for severe pain., Disp: , Rfl:  .  polyethylene glycol (MIRALAX / GLYCOLAX) packet, Take 17 g by mouth daily., Disp: , Rfl:  .  pregabalin (LYRICA) 75 MG capsule, Take 1 capsule by mouth twice daily, Disp: 180 capsule, Rfl: 0 .  saccharomyces boulardii (FLORASTOR) 250 MG capsule, Take 250 mg by mouth daily., Disp: , Rfl:  .  simvastatin (ZOCOR) 10 MG tablet, Take 1 tablet (10 mg total) by mouth at bedtime., Disp: 90 tablet, Rfl: 3 .  temazepam (RESTORIL) 15 MG capsule, Take 15 mg by mouth at bedtime as needed for sleep., Disp: , Rfl:  .  topiramate (TOPAMAX) 25 MG tablet, Take 1 tablet (25 mg total) by mouth daily., Disp: 30 tablet, Rfl: 0 .  traMADol (ULTRAM) 50 MG tablet, Take by mouth as needed for moderate pain., Disp: , Rfl:  .  TURMERIC PO, Take 1,000 mg by mouth daily., Disp: , Rfl:  .  venlafaxine XR (EFFEXOR-XR) 75 MG 24 hr capsule, Take 1 capsule (75 mg total) by mouth daily., Disp: 90 capsule, Rfl: 0  EXAM:  VITALS per patient if applicable:  GENERAL: alert, oriented, appears well and in no acute distress  HEENT: atraumatic, conjunttiva clear, no obvious abnormalities on inspection of external nose and ears  NECK: normal movements of the head and neck  LUNGS: on inspection no signs of respiratory distress, breathing rate appears normal, no obvious gross SOB, gasping or wheezing  CV: no obvious cyanosis  MS: moves all visible extremities without noticeable abnormality  PSYCH/NEURO: pleasant and cooperative, no obvious depression or anxiety, speech and thought processing grossly  intact  ASSESSMENT AND PLAN:  Discussed the following assessment and plan:  Discussed various options for migraine prophylaxis.  She is okay with trialing Topamax to see if this helps with the quantity of headaches.  If no improvement then referred to headache clinic for further evaluation.  Hx of migraine headaches - Plan: topiramate (TOPAMAX) 25 MG tablet     I discussed the assessment and treatment plan with the patient. The patient was provided an opportunity to ask questions and all were answered. The patient agreed with the plan and demonstrated an understanding of the instructions.   The patient was advised to call back or seek an in-person evaluation if the symptoms worsen or if the condition fails to improve as anticipated.   [name], NP

## 2019-03-21 NOTE — Telephone Encounter (Signed)
Appointment scheduled with Tommi Rumps for today at 3 PM.  Nothing further needed.

## 2019-04-12 ENCOUNTER — Other Ambulatory Visit: Payer: Self-pay | Admitting: Adult Health

## 2019-04-12 DIAGNOSIS — Z8669 Personal history of other diseases of the nervous system and sense organs: Secondary | ICD-10-CM

## 2019-04-12 MED ORDER — TOPIRAMATE 25 MG PO TABS: 25.0000 mg | ORAL_TABLET | Freq: Every day | ORAL | 3 refills | Status: DC

## 2019-04-12 NOTE — Progress Notes (Signed)
Office Visit Note  Patient: Jean Nash             Date of Birth: Aug 23, 1941           MRN: [id number]             PCP: [name], NP Referring: [name], NP Visit Date: 04/26/2019 Occupation: @GUAROCC @  Subjective:  Pain in multiple joints.   History of Present Illness: Jean Nash is a 78 y.o. female seen in consultation per request of her PCP.  According to patient about 15 years ago she developed rash all over for which she was seen at the hospital but no diagnosis was given.  The rash resolved and had no recurrence after that.  She states she was never given the diagnosis of psoriasis and she has never seen a dermatologist.  She has had joint pain in multiple joints for many years.  She states she had seen Dr. Reynaldo Minium and had right knee joint partial knee replacement and left knee joint total knee replacement.  She is also had bilateral bunionectomy in the past.  She states for the last few years she has been experiencing increased pain in her hands.  Her left hand has been very painful which she describes over the left [facility] joint.  She states she has decreased grip strength and has difficulty lifting objects.  She also does a lot of knitting which causes discomfort.  She gives history of lower back pain.  She states she is in September 2019 she had x-rays which were consistent with facet joint arthropathy.  She has history of fibromyalgia for many years.  She states she swims on a regular basis which is been very helpful.  She has been also seeing Dr. Annamaria Boots for many years for sleep apnea and pulmonary fibrosis which is been stable.  Activities of Daily Living:  Patient reports morning stiffness for 2 hours.   Patient Reports nocturnal pain.  Difficulty dressing/grooming: Denies Difficulty climbing stairs: Reports Difficulty getting out of chair: Denies Difficulty using hands for taps, buttons, cutlery, and/or writing: Reports  Review of Systems  Constitutional:  Positive for fatigue. Negative for night sweats, weight gain and weight loss.  HENT: Negative for mouth sores, trouble swallowing, trouble swallowing, mouth dryness and nose dryness.   Eyes: Negative for pain, redness, visual disturbance and dryness.  Respiratory: Negative for cough, shortness of breath and difficulty breathing.   Cardiovascular: Negative for chest pain, palpitations, hypertension, irregular heartbeat and swelling in legs/feet.  Gastrointestinal: Negative for blood in stool, constipation and diarrhea.  Endocrine: Negative for cold intolerance, heat intolerance and increased urination.  Genitourinary: Positive for involuntary urination. Negative for vaginal dryness.  Musculoskeletal: Positive for arthralgias, joint pain and morning stiffness. Negative for joint swelling, myalgias, muscle weakness, muscle tenderness and myalgias.  Skin: Negative for color change, rash, hair loss, skin tightness, ulcers and sensitivity to sunlight.  Allergic/Immunologic: Negative for susceptible to infections.  Neurological: Positive for weakness. Negative for dizziness, memory loss and night sweats.  Hematological: Negative for bruising/bleeding tendency and swollen glands.  Psychiatric/Behavioral: Positive for sleep disturbance. Negative for depressed mood. The patient is not nervous/anxious.     PMFS History:  Patient Active Problem List   Diagnosis Date Noted  . Pulmonary fibrosis ([place]) 12/04/2018  . MDD (major depressive disorder), recurrent episode, mild ([place]) 12/22/2017  . Osteoporosis 07/14/2017  . OA (osteoarthritis) of knee 11/03/2015  . Osteopenia 08/08/2015  . Headache 02/03/2015  . Atypical chest pain 07/15/2014  .  Arthralgia 02/22/2014  . Psoriasis 02/22/2014  . Malignant neoplasm of lower-outer quadrant of right breast of female, estrogen receptor positive ([place]) 08/09/2013  . Neck pain 06/22/2013  . Left knee pain 11/29/2012  . Reactive depression (situ[place]) 04/26/2012   . Right wrist pain 02/02/2012  . Right foot pain 10/04/2011  . Nevus 06/05/2011  . Status post total knee replacement 04/06/2011  . Overactive bladder 03/14/2011  . KNEE PAIN, LEFT 08/31/2010  . HEARING LOSS 08/17/2010  . HIRSUTISM 06/16/2009  . CYST, IDIOPATHIC 05/20/2008  . IRRITABLE BOWEL SYNDROME 04/15/2008  . Allergic asthma, mild intermittent, uncomplicated 31/54/0086  . GERD 03/19/2008  . COLONIC POLYPS, HX OF 03/19/2008  . NEPHROLITHIASIS, HX OF 03/19/2008  . Hypothyroidism 03/18/2008  . Hyperlipidemia 03/18/2008  . INSOMNIA, CHRONIC 03/18/2008  . FIBROMYALGIA 03/18/2008  . Prediabetes 03/18/2008    Past Medical History:  Diagnosis Date  . Anxiety    PHOBIAS  . Arthritis   . Breast cancer ([place]) 08/08/13   right LOQ  . Cataract   . Chronic insomnia   . Cluster headaches    history of migraines / NONE FOR SEVERAL YRS  . Depression   . Fibromyalgia   . GERD (gastroesophageal reflux disease)   . H/O hiatal hernia   . History of colonic polyps   . History of transfusion 08/30/2013  . Hx of radiation therapy 10/29/13- 12/14/13   right chest wall 5040 cGy 28 sessions, right supraclavicular/axillary region 5040 cGy 28 sessions, right chest wall boost 1000 cGy 5 sessions  . Hypothyroidism   . Irritable bowel syndrome   . Kidney stone   . Lymphedema    RT ARM - WEARS SLEEVE  . Macular degeneration    hole/right eye  . Osteopenia   . Other abnormal glucose   . Other and unspecified hyperlipidemia   . Pain in joint, shoulder region   . Pneumonia 7619,5093  . Sleep apnea    USES C-PAP  . Stress incontinence, female     Family History  Problem Relation Age of Onset  . Stroke Mother        died age 65  . Diabetes Mother   . Breast cancer Mother 70  . Breast cancer Sister 66  . Breast cancer Paternal Aunt 70  . Diabetes Maternal Grandfather   . Breast cancer Paternal Grandmother 85  . Breast cancer Paternal Aunt        dx in her 88s  . Cancer Maternal  Grandmother        intra-abdominal cancer  . Brain cancer Maternal Uncle 8  . Brain cancer Cousin 20       maternal cousin  . Brain cancer Cousin 20       paternal cousin  . Colon cancer Neg Hx    Past Surgical History:  Procedure Laterality Date  . ABDOMINAL HYSTERECTOMY    . APPENDECTOMY    . BILATERAL TOTAL MASTECTOMY WITH AXILLARY LYMPH NODE DISSECTION  08/30/2013   Dr Barry Dienes  . BREAST CYST ASPIRATION     9 cysts  . CATARACT EXTRACTION, BILATERAL  2005/2007  . CHOLECYSTECTOMY    . COLONOSCOPY    . EVACUATION BREAST HEMATOMA Left 08/31/2013   Procedure: EVACUATION HEMATOMA BREAST;  Surgeon: Stark Klein, MD;  Location: [facility];  Service: General;  Laterality: Left;  . EYE SURGERY     to repair macular hole  . FOOT ARTHROPLASTY     lt   . GANGLION CYST EXCISION  rt foot  . HEMORRHOID SURGERY     03/1993  . JOINT REPLACEMENT  03/15/11   left knee replacement  . KNEE ARTHROSCOPY     /partial knee 2016/left knee 2012  . MASS EXCISION  11/04/2011   Procedure: EXCISION MASS;  Surgeon: Cammie Sickle., MD;  Location: [facility];  Service: Orthopedics;  Laterality: Right;  excisional biopsy right ulna mass  . MASTECTOMY W/ SENTINEL NODE BIOPSY Right 08/30/2013   Procedure: RIGHT  AXILLARY SENTINEL LYMPH NODE BIOPSY; Right Axillary Node Disection;  Surgeon: Stark Klein, MD;  Location: [facility];  Service: General;  Laterality: Right;  Right side nuc med 7:00   . PARTIAL KNEE ARTHROPLASTY Right 11/03/2015   Procedure: RIGHT KNEE MEDIAL UNICOMPARTMENTAL ARTHROPLASTY ;  Surgeon: Gaynelle Arabian, MD;  [facility];  Service: Orthopedics;  Laterality: Right;  . SIMPLE MASTECTOMY WITH AXILLARY SENTINEL NODE BIOPSY Left 08/30/2013   Procedure: Bilateral Breast Mastectomy ;  Surgeon: Stark Klein, MD;  Location: [facility];  Service: General;  Laterality: Left;  . skin tags removed     breast, panty line, neckline  . TOE SURGERY     preventative crossover toe surg/right  foot  . TOE SURGERY  2009   left foot/screw  in 2nd toe  . TONSILLECTOMY    . UPPER GASTROINTESTINAL ENDOSCOPY     Social History   Social History Narrative   Occupation:  Retired Radiation protection practitioner    Married with 3 grown children      Never Smoked     Alcohol use-no        Immunization History  Administered Date(s) Administered  . Influenza Split 07/27/2012  . Influenza Whole 09/13/2007, 08/08/2008, 07/30/2009, 08/25/2010  . Influenza, High Dose Seasonal PF 08/03/2014, 08/08/2015, 08/23/2016, 07/14/2017, 07/20/2018  . Influenza,inj,Quad PF,6+ Mos 07/25/2013  . Pneumococcal Conjugate-13 08/16/2013  . Pneumococcal Polysaccharide-23 06/18/2008  . Tdap 11/29/2012  . Zoster 08/05/2011  . Zoster Recombinat (Shingrix) 04/11/2018, 09/22/2018     Objective: Vital Signs: BP 110/64 (BP Location: Left Arm, Patient Position: Sitting, Cuff Size: Normal)   Pulse 62   Resp 18   Ht 4\' 11"  (1.499 m)   Wt 161 lb (73 kg)   BMI 32.52 kg/m    Physical Exam Vitals signs and nursing note reviewed.  Constitutional:      Appearance: She is well-developed.  HENT:     Head: Normocephalic and atraumatic.  Eyes:     Conjunctiva/sclera: Conjunctivae normal.  Neck:     Musculoskeletal: Normal range of motion.  Cardiovascular:     Rate and Rhythm: Normal rate and regular rhythm.     Heart sounds: Normal heart sounds.  Pulmonary:     Effort: Pulmonary effort is normal.     Breath sounds: Normal breath sounds.  Abdominal:     General: Bowel sounds are normal.     Palpations: Abdomen is soft.  Lymphadenopathy:     Cervical: No cervical adenopathy.  Skin:    General: Skin is warm and dry.     Capillary Refill: Capillary refill takes less than 2 seconds.  Neurological:     Mental Status: She is alert and oriented to person, place, and time.  Psychiatric:        Behavior: Behavior normal.      Musculoskeletal Exam: C-spine good range of motion.  She has limited painful range of motion of her  lumbar spine.  Shoulder joints elbow joints are in good range of motion.  She  had some posttraumatic change in her right wrist joint.  She has left CMC thickening and tenderness.  No PIP or DIP swelling was noted.  Hip joints, knee joints were in good range of motion.  Her bilateral knee joints are replaced.  She has osteoarthritic changes in her feet.  No synovitis was noted. CDAI Exam: CDAI Score: - Patient Global: -; Provider Global: - Swollen: -; Tender: - Joint Exam   No joint exam has been documented for this visit   There is currently no information documented on the homunculus. Go to the Rheumatology activity and complete the homunculus joint exam.  Investigation: Findings:  01/03/19: ANA negative, RF<14, CRP <1, sed rate 15  Component     Latest Ref Rng & Units 01/03/2019  Anti Nuclear Antibody (ANA)     NEGATIVE NEGATIVE  RA Latex Turbid.     <14 IU/mL <14  CRP     0.5 - 20.0 mg/dL <1.0  Sed Rate     0 - 30 mm/hr 15   Imaging: No results found.  Recent Labs: Lab Results  Component Value Date   WBC 6.1 07/20/2018   HGB 14.7 07/20/2018   PLT 164.0 07/20/2018   NA 140 07/20/2018   K 4.2 07/20/2018   CL 105 07/20/2018   CO2 29 07/20/2018   GLUCOSE 103 (H) 07/20/2018   BUN 17 07/20/2018   CREATININE 0.58 07/20/2018   BILITOT 0.5 07/20/2018   ALKPHOS 45 07/20/2018   AST 19 07/20/2018   ALT 34 07/20/2018   PROT 6.3 07/20/2018   ALBUMIN 4.2 07/20/2018   CALCIUM 9.2 07/20/2018   GFRAA >60 02/26/2018    Speciality Comments: No specialty comments available.  Procedures:  No procedures performed Allergies: Patient has no known allergies.   Assessment / Plan:     Visit Diagnoses: Polyarthralgia -patient complains of pain in multiple joints.  The clinical findings are consistent with osteoarthritis.  I do not see any synovitis on examination.  All autoimmune work-up has been negative.  01/03/19: ANA negative, RF<14, CRP <1, sed rate 15  Primary osteoarthritis of  both hand-clinical findings are consistent with osteoarthritis.  She has left CMC pain and discomfort.  Joint protection muscle strengthening was discussed.  She has a [place] brace have advised her to use it on a regular basis.  She also has diclofenac gel which have advised her to use.  A handout on hand exercises was given.  Arthropathy of lumbar facet joints-I reviewed x-rays of the lumbar region.  She has constant lower back discomfort.  Weight loss diet and exercise was discussed.  A handout on back exercises was given.  Status post total knee replacement, unspecified laterality -she has right partial knee replacement and left total knee replacement is doing fairly well.  Fibromyalgia -patient has longstanding history of fibromyalgia and generalized pain.  Pulmonary fibrosis ([place]) -patient has been followed by Dr. Annamaria Boots.  Allergic asthma, mild intermittent, uncomplicated -followed up by Dr. Annamaria Boots Other medical problems are listed as follows:  History of hypothyroidism   Malignant neoplasm of lower-outer quadrant of right breast of female, estrogen receptor positive ([place])   Prediabetes   History of colonic polyps  History of nephrolithiasis  History of IBS   History of gastroesophageal reflux (GERD)   MDD (major depressive disorder), recurrent episode, mild ([place]) -   History of hyperlipidemia  Orders: No orders of the defined types were placed in this encounter.  No orders of the defined types were placed in  this encounter.   Face-to-face time spent with patient was 45 minutes. Greater than 50% of time was spent in counseling and coordination of care.  Follow-Up Instructions: Return if symptoms worsen or fail to improve, for Osteoarthritis.   Bo Merino, MD  Note - This record has been created using Editor, commissioning.  Chart creation errors have been sought, but may not always  have been located. Such creation errors do not reflect on  the standard of medical care.

## 2019-04-13 DIAGNOSIS — I890 Lymphedema, not elsewhere classified: Secondary | ICD-10-CM | POA: Diagnosis not present

## 2019-04-18 DIAGNOSIS — H1013 Acute atopic conjunctivitis, bilateral: Secondary | ICD-10-CM | POA: Diagnosis not present

## 2019-04-18 DIAGNOSIS — H35341 Macular cyst, hole, or pseudohole, right eye: Secondary | ICD-10-CM | POA: Diagnosis not present

## 2019-04-20 ENCOUNTER — Encounter: Payer: Self-pay | Admitting: Rheumatology

## 2019-04-20 NOTE — Telephone Encounter (Signed)
Spoke with patient and she states she has memory issues and needs her husband to attend her appointment. Patient advised he may attend her appointment. Advised they both will need to wear mask and will have their temperature checked at the door.

## 2019-04-26 ENCOUNTER — Encounter: Payer: Self-pay | Admitting: Rheumatology

## 2019-04-26 ENCOUNTER — Ambulatory Visit (INDEPENDENT_AMBULATORY_CARE_PROVIDER_SITE_OTHER): Payer: Medicare HMO | Admitting: Rheumatology

## 2019-04-26 ENCOUNTER — Other Ambulatory Visit: Payer: Self-pay

## 2019-04-26 VITALS — BP 110/64 | HR 62 | Resp 18 | Ht 59.0 in | Wt 161.0 lb

## 2019-04-26 DIAGNOSIS — R7303 Prediabetes: Secondary | ICD-10-CM

## 2019-04-26 DIAGNOSIS — C50511 Malignant neoplasm of lower-outer quadrant of right female breast: Secondary | ICD-10-CM

## 2019-04-26 DIAGNOSIS — F330 Major depressive disorder, recurrent, mild: Secondary | ICD-10-CM

## 2019-04-26 DIAGNOSIS — M19042 Primary osteoarthritis, left hand: Secondary | ICD-10-CM

## 2019-04-26 DIAGNOSIS — Z860100 Personal history of colon polyps, unspecified: Secondary | ICD-10-CM

## 2019-04-26 DIAGNOSIS — J8410 Pulmonary fibrosis, unspecified: Secondary | ICD-10-CM | POA: Diagnosis not present

## 2019-04-26 DIAGNOSIS — J4520 Mild intermittent asthma, uncomplicated: Secondary | ICD-10-CM

## 2019-04-26 DIAGNOSIS — Z8639 Personal history of other endocrine, nutritional and metabolic disease: Secondary | ICD-10-CM

## 2019-04-26 DIAGNOSIS — Z96659 Presence of unspecified artificial knee joint: Secondary | ICD-10-CM

## 2019-04-26 DIAGNOSIS — M797 Fibromyalgia: Secondary | ICD-10-CM | POA: Diagnosis not present

## 2019-04-26 DIAGNOSIS — Z8719 Personal history of other diseases of the digestive system: Secondary | ICD-10-CM

## 2019-04-26 DIAGNOSIS — Z87442 Personal history of urinary calculi: Secondary | ICD-10-CM | POA: Diagnosis not present

## 2019-04-26 DIAGNOSIS — M2550 Pain in unspecified joint: Secondary | ICD-10-CM | POA: Diagnosis not present

## 2019-04-26 DIAGNOSIS — M19041 Primary osteoarthritis, right hand: Secondary | ICD-10-CM

## 2019-04-26 DIAGNOSIS — Z170 Estrogen receptor positive status [ER+]: Secondary | ICD-10-CM

## 2019-04-26 DIAGNOSIS — M47816 Spondylosis without myelopathy or radiculopathy, lumbar region: Secondary | ICD-10-CM

## 2019-04-26 NOTE — Patient Instructions (Signed)
Hand Exercises Hand exercises can be helpful to almost anyone. These exercises can strengthen the hands, improve flexibility and movement, and increase blood flow to the hands. These results can make work and daily tasks easier. Hand exercises can be especially helpful for people who have joint pain from arthritis or have nerve damage from overuse (carpal tunnel syndrome). These exercises can also help people who have injured a hand. Most of these hand exercises are fairly gentle stretching routines. You can do them often throughout the day. Still, it is a good idea to ask your health care provider which exercises would be best for you. Warming your hands before exercise may help to reduce stiffness. You can do this with gentle massage or by placing your hands in warm water for 15 minutes. Also, make sure you pay attention to your level of hand pain as you begin an exercise routine. Exercises Knuckle bend Repeat this exercise 5-10 times with each hand. 1. Stand or sit with your arm, hand, and all five fingers pointed straight up. Make sure your wrist is straight. 2. Gently and slowly bend your fingers down and inward until the tips of your fingers are touching the tops of your palm. 3. Hold this position for a few seconds. 4. Extend your fingers out to their original position, all pointing straight up again. Finger fan Repeat this exercise 5-10 times with each hand. 1. Hold your arm and hand out in front of you. Keep your wrist straight. 2. Squeeze your hand into a fist. 3. Hold this position for a few seconds. 4. Fan out, or spread apart, your hand and fingers as much as possible, stretching every joint fully. Tabletop Repeat this exercise 5-10 times with each hand. 1. Stand or sit with your arm, hand, and all five fingers pointed straight up. Make sure your wrist is straight. 2. Gently and slowly bend your fingers at the knuckles where they meet the hand until your hand is making an upside-down  L shape. Your fingers should form a tabletop. 3. Hold this position for a few seconds. 4. Extend your fingers out to their original position, all pointing straight up again. Making Os Repeat this exercise 5-10 times with each hand. 1. Stand or sit with your arm, hand, and all five fingers pointed straight up. Make sure your wrist is straight. 2. Make an O shape by touching your pointer finger to your thumb. Hold for a few seconds. Then open your hand wide. 3. Repeat this motion with each finger on your hand. Table spread Repeat this exercise 5-10 times with each hand. 1. Place your hand on a table with your palm facing down. Make sure your wrist is straight. 2. Spread your fingers out as much as possible. Hold this position for a few seconds. 3. Slide your fingers back together again. Hold for a few seconds. Ball grip Repeat this exercise 10-15 times with each hand. 1. Hold a tennis ball or another soft ball in your hand. 2. While slowly increasing pressure, squeeze the ball as hard as possible. 3. Squeeze as hard as you can for 3-5 seconds. 4. Relax and repeat.  Wrist curls Repeat this exercise 10-15 times with each hand. 1. Sit in a chair that has armrests. 2. Hold a light weight in your hand, such as a dumbbell that weighs 1-3 pounds (0.5-1.4 kg). Ask your health care provider what weight would be best for you. 3. Rest your hand just over the end of the chair arm   with your palm facing up. 4. Gently pivot your wrist up and down while holding the weight. Do not twist your wrist from side to side. Contact a health care provider if:  Your hand pain or discomfort gets much worse when you do an exercise.  Your hand pain or discomfort does not improve within 2 hours after you exercise. If you have any of these problems, stop doing these exercises right away. Do not do them again unless your health care provider says that you can. Get help right away if:  You develop sudden, severe hand  pain. If this happens, stop doing these exercises right away. Do not do them again unless your health care provider says that you can. This information is not intended to replace advice given to you by your health care provider. Make sure you discuss any questions you have with your health care provider. Document Released: 10/13/2015 Document Revised: 03/07/2018 Document Reviewed: 05/12/2015 [employer] Interactive Patient Education  2019 [employer] Back Exercises The following exercises strengthen the muscles that help to support the back. They also help to keep the lower back flexible. Doing these exercises can help to prevent back pain or lessen existing pain. If you have back pain or discomfort, try doing these exercises 2-3 times each day or as told by your health care provider. When the pain goes away, do them once each day, but increase the number of times that you repeat the steps for each exercise (do more repetitions). If you do not have back pain or discomfort, do these exercises once each day or as told by your health care provider. Exercises Single Knee to Chest Repeat these steps 3-5 times for each leg: 1. Lie on your back on a firm bed or the floor with your legs extended. 2. Bring one knee to your chest. Your other leg should stay extended and in contact with the floor. 3. Hold your knee in place by grabbing your knee or thigh. 4. Pull on your knee until you feel a gentle stretch in your lower back. 5. Hold the stretch for 10-30 seconds. 6. Slowly release and straighten your leg. Pelvic Tilt Repeat these steps 5-10 times: 1. Lie on your back on a firm bed or the floor with your legs extended. 2. Bend your knees so they are pointing toward the ceiling and your feet are flat on the floor. 3. Tighten your lower abdominal muscles to press your lower back against the floor. This motion will tilt your pelvis so your tailbone points up toward the ceiling instead of pointing to your feet  or the floor. 4. With gentle tension and even breathing, hold this position for 5-10 seconds. Cat-Cow Repeat these steps until your lower back becomes more flexible: 1. Get into a hands-and-knees position on a firm surface. Keep your hands under your shoulders, and keep your knees under your hips. You may place padding under your knees for comfort. 2. Let your head hang down, and point your tailbone toward the floor so your lower back becomes rounded like the back of a cat. 3. Hold this position for 5 seconds. 4. Slowly lift your head and point your tailbone up toward the ceiling so your back forms a sagging arch like the back of a cow. 5. Hold this position for 5 seconds.  Press-Ups Repeat these steps 5-10 times: 1. Lie on your abdomen (face-down) on the floor. 2. Place your palms near your head, about shoulder-width apart. 3. While you keep your  back as relaxed as possible and keep your hips on the floor, slowly straighten your arms to raise the top half of your body and lift your shoulders. Do not use your back muscles to raise your upper torso. You may adjust the placement of your hands to make yourself more comfortable. 4. Hold this position for 5 seconds while you keep your back relaxed. 5. Slowly return to lying flat on the floor.  Bridges Repeat these steps 10 times: 1. Lie on your back on a firm surface. 2. Bend your knees so they are pointing toward the ceiling and your feet are flat on the floor. 3. Tighten your buttocks muscles and lift your buttocks off of the floor until your waist is at almost the same height as your knees. You should feel the muscles working in your buttocks and the back of your thighs. If you do not feel these muscles, slide your feet 1-2 inches farther away from your buttocks. 4. Hold this position for 3-5 seconds. 5. Slowly lower your hips to the starting position, and allow your buttocks muscles to relax completely. If this exercise is too easy, try doing  it with your arms crossed over your chest. Abdominal Crunches Repeat these steps 5-10 times: 1. Lie on your back on a firm bed or the floor with your legs extended. 2. Bend your knees so they are pointing toward the ceiling and your feet are flat on the floor. 3. Cross your arms over your chest. 4. Tip your chin slightly toward your chest without bending your neck. 5. Tighten your abdominal muscles and slowly raise your trunk (torso) high enough to lift your shoulder blades a tiny bit off of the floor. Avoid raising your torso higher than that, because it can put too much stress on your low back and it does not help to strengthen your abdominal muscles. 6. Slowly return to your starting position. Back Lifts Repeat these steps 5-10 times: 1. Lie on your abdomen (face-down) with your arms at your sides, and rest your forehead on the floor. 2. Tighten the muscles in your legs and your buttocks. 3. Slowly lift your chest off of the floor while you keep your hips pressed to the floor. Keep the back of your head in line with the curve in your back. Your eyes should be looking at the floor. 4. Hold this position for 3-5 seconds. 5. Slowly return to your starting position. Contact a health care provider if:  Your back pain or discomfort gets much worse when you do an exercise.  Your back pain or discomfort does not lessen within 2 hours after you exercise. If you have any of these problems, stop doing these exercises right away. Do not do them again unless your health care provider says that you can. Get help right away if:  You develop sudden, severe back pain. If this happens, stop doing the exercises right away. Do not do them again unless your health care provider says that you can. This information is not intended to replace advice given to you by your health care provider. Make sure you discuss any questions you have with your health care provider. Document Released: 12/09/2004 Document  Revised: 03/07/2018 Document Reviewed: 12/26/2014 Elsevier Interactive Patient Education  [employer].

## 2019-05-03 DIAGNOSIS — I972 Postmastectomy lymphedema syndrome: Secondary | ICD-10-CM | POA: Diagnosis not present

## 2019-05-03 DIAGNOSIS — C50919 Malignant neoplasm of unspecified site of unspecified female breast: Secondary | ICD-10-CM | POA: Diagnosis not present

## 2019-05-10 ENCOUNTER — Ambulatory Visit: Payer: Medicare HMO | Admitting: Family Medicine

## 2019-05-14 DIAGNOSIS — I890 Lymphedema, not elsewhere classified: Secondary | ICD-10-CM | POA: Diagnosis not present

## 2019-05-16 DIAGNOSIS — C50911 Malignant neoplasm of unspecified site of right female breast: Secondary | ICD-10-CM | POA: Diagnosis not present

## 2019-05-16 DIAGNOSIS — Z9012 Acquired absence of left breast and nipple: Secondary | ICD-10-CM | POA: Diagnosis not present

## 2019-05-17 ENCOUNTER — Encounter: Payer: Self-pay | Admitting: Family Medicine

## 2019-05-17 ENCOUNTER — Other Ambulatory Visit: Payer: Self-pay

## 2019-05-17 ENCOUNTER — Ambulatory Visit (INDEPENDENT_AMBULATORY_CARE_PROVIDER_SITE_OTHER): Payer: Medicare HMO | Admitting: Family Medicine

## 2019-05-17 ENCOUNTER — Telehealth: Payer: Self-pay | Admitting: *Deleted

## 2019-05-17 VITALS — BP 102/70 | HR 68 | Temp 98.7°F | Wt 162.0 lb

## 2019-05-17 DIAGNOSIS — R238 Other skin changes: Secondary | ICD-10-CM

## 2019-05-17 NOTE — Telephone Encounter (Signed)
Copied from [place] [id number]. Topic: Appointment Scheduling - Scheduling Inquiry for Clinic >> May 16, 2019  4:28 PM R[place] N[place], [place] wrote: Reason for CRM: Patient's husband calling to schedule an appointment for the patient. States that she is having a problem in her right ear canal. States that it's either a cyst or a pimple. Patient complains of pain 2x days.  CB#: [phone]

## 2019-05-17 NOTE — Telephone Encounter (Signed)
Pt scheduled to see Dr Sarajane Jews today.  Nothing further needed.

## 2019-05-17 NOTE — Progress Notes (Signed)
   Subjective:    [name], female    DOB: 03-20-1941, 78 y.o.   MRN: [id number]  HPI Here for 3 days of a tender lump in the right ear lobe. No other symptoms.    Review of Systems  Constitutional: Negative.   HENT: Positive for ear pain. Negative for congestion, ear discharge, postnasal drip, sinus pain and sore throat.   Eyes: Negative.   Respiratory: Negative.   Cardiovascular: Negative.        Objective:   Physical Exam Constitutional:      Appearance: Normal appearance.  HENT:     Right Ear: Tympanic membrane and ear canal normal.     Left Ear: Tympanic membrane, ear canal and external ear normal.     Ears:     Comments: There is a small tender nodule on the inner right tragus, no warmth or erythema     Nose: Nose normal.     Mouth/Throat:     Pharynx: Oropharynx is clear.  Eyes:     Conjunctiva/sclera: Conjunctivae normal.  Pulmonary:     Effort: Pulmonary effort is normal.     Breath sounds: Normal breath sounds.  Lymphadenopathy:     Cervical: No cervical adenopathy.  Neurological:     Mental Status: She is alert.           Assessment & Plan:  This is a simple pimple. It will resolve on its own. She may apply Neosporin to it several times a day if she wishes.  Alysia Penna, MD

## 2019-05-21 ENCOUNTER — Inpatient Hospital Stay: Admission: RE | Admit: 2019-05-21 | Payer: Medicare HMO | Source: Ambulatory Visit

## 2019-05-22 DIAGNOSIS — C50911 Malignant neoplasm of unspecified site of right female breast: Secondary | ICD-10-CM | POA: Diagnosis not present

## 2019-05-22 DIAGNOSIS — Z9012 Acquired absence of left breast and nipple: Secondary | ICD-10-CM | POA: Diagnosis not present

## 2019-05-23 NOTE — Progress Notes (Signed)
Virtual Visit via Video Note  I connected with Jean Nash on 05/31/19 at  1:00 PM EDT by a video enabled telemedicine application and verified that I am speaking with the correct person using two identifiers.   I discussed the limitations of evaluation and management by telemedicine and the availability of in person appointments. The patient expressed understanding and agreed to proceed.     I discussed the assessment and treatment plan with the patient. The patient was provided an opportunity to ask questions and all were answered. The patient agreed with the plan and demonstrated an understanding of the instructions.   The patient was advised to call back or seek an in-person evaluation if the symptoms worsen or if the condition fails to improve as anticipated.  I provided 15 minutes of non-face-to-face time during this encounter.   Norman Clay, MD    [facility] MD/PA/NP OP Progress Note  05/31/2019 1:10 PM Jean Nash  MRN:  [id number]  Chief Complaint:  Chief Complaint    Depression; Follow-up     HPI:  This is a follow-up appointment for depression.  She states that she just states appointment with her husband this morning.  Although she may goes to grocery shopping, she is staying in the house and wants to avoid infection. She may occasionally meet with her couple friend.  She has started making salad for her husband.  Although she feels bothered by lymphedema in her arm, she denies any significant mood issues.  She feels secured, being with her husband. She feels excited to have two great grandchildren. Her children are doing well. She has been sleeping better after she started on topiramate for headache.  She denies feeling depressed.  She denies fatigue.  She has good concentration.  SHe has good energy and motivation.  Denies SI. She denies anxiety or panic attacks. She is willing to taper off venlafaxine.    Visit Diagnosis:    ICD-10-CM   1. MDD (major depressive  disorder), recurrent, in full remission ([place])  F33.42     Past Psychiatric History: Please see initial evaluation for full details. I have reviewed the history. No updates at this time.     Past Medical History:  Past Medical History:  Diagnosis Date  . Anxiety    PHOBIAS  . Arthritis   . Breast cancer ([place]) 08/08/13   right LOQ  . Cataract   . Chronic insomnia   . Cluster headaches    history of migraines / NONE FOR SEVERAL YRS  . Depression   . Fibromyalgia   . GERD (gastroesophageal reflux disease)   . H/O hiatal hernia   . History of colonic polyps   . History of transfusion 08/30/2013  . Hx of radiation therapy 10/29/13- 12/14/13   right chest wall 5040 cGy 28 sessions, right supraclavicular/axillary region 5040 cGy 28 sessions, right chest wall boost 1000 cGy 5 sessions  . Hypothyroidism   . Irritable bowel syndrome   . Kidney stone   . Lymphedema    RT ARM - WEARS SLEEVE  . Macular degeneration    hole/right eye  . Osteopenia   . Other abnormal glucose   . Other and unspecified hyperlipidemia   . Pain in joint, shoulder region   . Pneumonia 4562,5638  . Sleep apnea    USES C-PAP  . Stress incontinence, female     Past Surgical History:  Procedure Laterality Date  . ABDOMINAL HYSTERECTOMY    . APPENDECTOMY    . BILATERAL  TOTAL MASTECTOMY WITH AXILLARY LYMPH NODE DISSECTION  08/30/2013   Dr Barry Dienes  . BREAST CYST ASPIRATION     9 cysts  . CATARACT EXTRACTION, BILATERAL  2005/2007  . CHOLECYSTECTOMY    . COLONOSCOPY    . EVACUATION BREAST HEMATOMA Left 08/31/2013   Procedure: EVACUATION HEMATOMA BREAST;  Surgeon: Stark Klein, MD;  Location: [place];  Service: General;  Laterality: Left;  . EYE SURGERY     to repair macular hole  . FOOT ARTHROPLASTY     lt   . GANGLION CYST EXCISION     rt foot  . HEMORRHOID SURGERY     03/1993  . JOINT REPLACEMENT  03/15/11   left knee replacement  . KNEE ARTHROSCOPY     /partial knee 2016/left knee 2012  . MASS  EXCISION  11/04/2011   Procedure: EXCISION MASS;  Surgeon: Cammie Sickle., MD;  Location: [place];  Service: Orthopedics;  Laterality: Right;  excisional biopsy right ulna mass  . MASTECTOMY W/ SENTINEL NODE BIOPSY Right 08/30/2013   Procedure: RIGHT  AXILLARY SENTINEL LYMPH NODE BIOPSY; Right Axillary Node Disection;  Surgeon: Stark Klein, MD;  Location: [place];  Service: General;  Laterality: Right;  Right side nuc med 7:00   . PARTIAL KNEE ARTHROPLASTY Right 11/03/2015   Procedure: RIGHT KNEE MEDIAL UNICOMPARTMENTAL ARTHROPLASTY ;  Surgeon: Gaynelle Arabian, MD;  Location: [facility];  Service: Orthopedics;  Laterality: Right;  . SIMPLE MASTECTOMY WITH AXILLARY SENTINEL NODE BIOPSY Left 08/30/2013   Procedure: Bilateral Breast Mastectomy ;  Surgeon: Stark Klein, MD;  Location: [place];  Service: General;  Laterality: Left;  . skin tags removed     breast, panty line, neckline  . TOE SURGERY     preventative crossover toe surg/right foot  . TOE SURGERY  2009   left foot/screw  in 2nd toe  . TONSILLECTOMY    . UPPER GASTROINTESTINAL ENDOSCOPY      Family Psychiatric History: Please see initial evaluation for full details. I have reviewed the history. No updates at this time.     Family History:  Family History  Problem Relation Age of Onset  . Stroke Mother        died age 47  . Diabetes Mother   . Breast cancer Mother 21  . Breast cancer Sister 57  . Breast cancer Paternal Aunt 39  . Diabetes Maternal Grandfather   . Breast cancer Paternal Grandmother 20  . Breast cancer Paternal Aunt        dx in her 35s  . Cancer Maternal Grandmother        intra-abdominal cancer  . Brain cancer Maternal Uncle 8  . Brain cancer Cousin 2       maternal cousin  . Brain cancer Cousin 20       paternal cousin  . Colon cancer Neg Hx     Social History:  Social History   Socioeconomic History  . Marital status: Married    Spouse name: Not on file  . Number of children:  3  . Years of education: Not on file  . Highest education level: Not on file  Occupational History  . Occupation: retired Medical laboratory scientific officer  . Financial resource strain: Not on file  . Food insecurity    Worry: Not on file    Inability: Not on file  . Transportation needs    Medical: Not on file    Non-medical: Not on file  Tobacco  Use  . Smoking status: Former Smoker    Packs/day: 0.10    Years: 2.00    Pack years: 0.20    Types: Cigarettes    Start date: 11/16/1959    Quit date: 11/15/1960    Years since quitting: 58.5  . Smokeless tobacco: Never Used  Substance and Sexual Activity  . Alcohol use: No    Alcohol/week: 0.0 standard drinks  . Drug use: No  . Sexual activity: Not on file    Comment: menarche age 70, fist live birth 33, P 3, hysterectomy age 26, no HRT, BCP 2 yrs  Lifestyle  . Physical activity    Days per week: Not on file    Minutes per session: Not on file  . Stress: Not on file  Relationships  . Social Herbalist on phone: Not on file    Gets together: Not on file    Attends religious service: Not on file    Active member of club or organization: Not on file    Attends meetings of clubs or organizations: Not on file    Relationship status: Not on file  Other Topics Concern  . Not on file  Social History Narrative   Occupation:  Retired Radiation protection practitioner    Married with 3 grown children      Never Smoked     Alcohol use-no         Allergies: No Known Allergies  Metabolic Disorder Labs: Lab Results  Component Value Date   HGBA1C 6.0 07/14/2017   MPG 126 (H) 08/27/2013   MPG 117 (H) 06/22/2013   Lab Results  Component Value Date   PROLACTIN 3.9 07/22/2009   Lab Results  Component Value Date   CHOL 164 07/20/2018   TRIG 105.0 07/20/2018   HDL 46.50 07/20/2018   CHOLHDL 4 07/20/2018   VLDL 21.0 07/20/2018   LDLCALC 96 07/20/2018   LDLCALC 105 (H) 07/14/2017   Lab Results  Component Value Date   TSH 0.84 07/20/2018   TSH  0.75 07/14/2017    Therapeutic Level Labs: No results found for: LITHIUM No results found for: VALPROATE No components found for:  CBMZ  Current Medications: Current Outpatient Medications  Medication Sig Dispense Refill  . Biotin 1000 MCG CHEW Chew by mouth.    . butalbital-acetaminophen-caffeine (FIORICET, ESGIC) 50-325-40 MG tablet Take 1-2 tablets by mouth every 6 (six) hours as needed for headache. 20 tablet 0  . Calcium Carb-Cholecalciferol (CALCIUM-VITAMIN D) 500-400 MG-UNIT TABS Take by mouth 2 (two) times daily.     . cholecalciferol (VITAMIN D) 1000 units tablet Take 2,000 Units by mouth daily.    Marland Kitchen co-enzyme Q-10 30 MG capsule Take 300 mg by mouth daily.    . diazepam (VALIUM) 5 MG tablet TAKE 1 TO 2 TABLETS BY MOUTH AT BEDTIME. TAKE 1 OR 2 PILLS 1 HOUR PRIOR TO DENTAL APPOINTMENT    . diclofenac sodium (VOLTAREN) 1 % GEL as needed.     Marland Kitchen esomeprazole (NEXIUM) 40 MG capsule Take 1 capsule by mouth  daily 90 capsule 3  . levothyroxine (SYNTHROID, LEVOTHROID) 100 MCG tablet Take 1 tablet (100 mcg total) by mouth daily before breakfast. Take one pill in middle of night 90 tablet 3  . LUTEIN PO Take by mouth.    . meloxicam (MOBIC) 7.5 MG tablet Take 1 tablet by mouth twice daily as needed for pain 90 tablet 1  . methocarbamol (ROBAXIN) 500 MG tablet Take 500 mg by mouth  as needed for muscle spasms.    Marland Kitchen omega-3 acid ethyl esters (LOVAZA) 1 g capsule Take 1 capsule (1 g total) by mouth 2 (two) times daily. 180 capsule 3  . ondansetron (ZOFRAN) 4 MG tablet Take 1 tablet (4 mg total) by mouth every 8 (eight) hours as needed for nausea or vomiting. 20 tablet 0  . oxyCODONE-acetaminophen (PERCOCET) 5-325 MG tablet Take by mouth as needed for severe pain.    . polyethylene glycol (MIRALAX / GLYCOLAX) packet Take 17 g by mouth daily.    . pregabalin (LYRICA) 75 MG capsule Take 1 capsule by mouth twice daily 180 capsule 0  . saccharomyces boulardii (FLORASTOR) 250 MG capsule Take 250 mg  by mouth daily.    . simvastatin (ZOCOR) 10 MG tablet Take 1 tablet (10 mg total) by mouth at bedtime. 90 tablet 3  . temazepam (RESTORIL) 15 MG capsule Take 15 mg by mouth at bedtime as needed for sleep.    Marland Kitchen topiramate (TOPAMAX) 25 MG tablet Take 1 tablet (25 mg total) by mouth daily. 90 tablet 3  . traMADol (ULTRAM) 50 MG tablet Take by mouth as needed for moderate pain.    . TURMERIC PO Take 1,000 mg by mouth daily.    Marland Kitchen venlafaxine XR (EFFEXOR-XR) 75 MG 24 hr capsule Take 1 capsule (75 mg total) by mouth daily. 90 capsule 0   No current facility-administered medications for this visit.      Musculoskeletal: Strength & Muscle Tone: N/A Gait & Station: N/A Patient leans: Backward  Psychiatric Specialty Exam: Review of Systems  Psychiatric/Behavioral: Negative for depression, hallucinations, memory loss, substance abuse and suicidal ideas. The patient has insomnia. The patient is not nervous/anxious.   All other systems reviewed and are negative.   There were no vitals taken for this visit.There is no height or weight on file to calculate BMI.  General Appearance: Fairly Groomed  Eye Contact:  Good  Speech:  Clear and Coherent  Volume:  Normal  Mood:  "good"  Affect:  Appropriate, Congruent and Full Range  Thought Process:  Coherent  Orientation:  Full (Time, Place, and Person)  Thought Content: Logical   Suicidal Thoughts:  No  Homicidal Thoughts:  No  Memory:  Immediate;   Good  Judgement:  Good  Insight:  Good  Psychomotor Activity:  Normal  Concentration:  Concentration: Good and Attention Span: Good  Recall:  Good  Fund of Knowledge: Good  Language: Good  Akathisia:  No  Handed:  Right  AIMS (if indicated): not done  Assets:  Communication Skills Desire for Improvement  ADL's:  Intact  Cognition: WNL  Sleep:  Fair   Screenings: PHQ2-9     Office Visit from 07/20/2018 in [place] at [facility] from 07/14/2017 in [place] at  [facility] from 11/28/2015 in [place] at [facility] from 10/14/2014 in [place] at [facility] from 10/01/2013 in [place] at [facility] Total Score  0  0  0  1  0  PHQ-9 Total Score  -  5  -  -  -       Assessment and Plan:  Esbeidy Mclaine is a 78 y.o. year old female with a history of depression, anxiety,breast cancer s/p mastectomy , who presents for follow up appointment for depression.   # MDD, recurrent in full remission She denies any significant mood symptoms after tapering down medication.  Although she does have a history  of depression, she was not treated with antidepressant before and she has been on Effexor at least for several months with good response to medication.  Will plan to taper off venlafaxine.  Also discussed possible discontinuation symptoms. Provided psychoeducation of relapse in her symptoms.  She is advised to contact the office if any worsening in her mood symptoms.  Discussed behavioral activation.   Plan 1. Decrease venlafaxine 37.5 mg daily for two weeks, then discontinue 2. Next appointment: 10/12 at 1:20 for 20 mins, video [email or web address]  The patient demonstrates the following risk factors for suicide: Chronic risk factors for suicide include:psychiatric disorder ofdepression. Acute risk factorsfor suicide include: N/A. Protective factorsfor this patient include: positive social support, coping skills and hope for the future. Considering these factors, the overall suicide risk at this point appears to below. Patientisappropriate for outpatient follow up.  The duration of this appointment visit was 15 minutes of non face-to-face time with the patient.  Greater than 50% of this time was spent in counseling, explanation of  diagnosis, planning of further management, and coordination of care.  Norman Clay, MD 05/31/2019, 1:10 PM

## 2019-05-24 ENCOUNTER — Telehealth: Payer: Self-pay | Admitting: Internal Medicine

## 2019-05-24 NOTE — Telephone Encounter (Signed)
Pt called into [facility] to cancel appt for CT on 05/25/19 and wanted to cancel appt with Dr Annamaria Boots also.  Pt states she will call back at a later date to reschedule both appts. Benjie Karvonen, CMA

## 2019-05-25 ENCOUNTER — Other Ambulatory Visit: Payer: Medicare HMO

## 2019-05-31 ENCOUNTER — Other Ambulatory Visit: Payer: Self-pay

## 2019-05-31 ENCOUNTER — Encounter (HOSPITAL_COMMUNITY): Payer: Self-pay | Admitting: Psychiatry

## 2019-05-31 ENCOUNTER — Ambulatory Visit (INDEPENDENT_AMBULATORY_CARE_PROVIDER_SITE_OTHER): Payer: Medicare HMO | Admitting: Psychiatry

## 2019-05-31 DIAGNOSIS — R69 Illness, unspecified: Secondary | ICD-10-CM | POA: Diagnosis not present

## 2019-05-31 DIAGNOSIS — F3342 Major depressive disorder, recurrent, in full remission: Secondary | ICD-10-CM

## 2019-05-31 MED ORDER — VENLAFAXINE HCL ER 37.5 MG PO CP24: 37.5000 mg | ORAL_CAPSULE | Freq: Every day | ORAL | 0 refills | Status: DC

## 2019-05-31 NOTE — Patient Instructions (Signed)
1. Decrease venlafaxine 37.5 mg daily for two weeks, then discontinue 2. Next appointment: 10/12 at 1:20

## 2019-06-04 ENCOUNTER — Ambulatory Visit: Payer: Self-pay | Admitting: Internal Medicine

## 2019-06-13 DIAGNOSIS — I890 Lymphedema, not elsewhere classified: Secondary | ICD-10-CM | POA: Diagnosis not present

## 2019-06-19 DIAGNOSIS — K5900 Constipation, unspecified: Secondary | ICD-10-CM | POA: Diagnosis not present

## 2019-06-19 DIAGNOSIS — K449 Diaphragmatic hernia without obstruction or gangrene: Secondary | ICD-10-CM | POA: Diagnosis not present

## 2019-06-19 DIAGNOSIS — E785 Hyperlipidemia, unspecified: Secondary | ICD-10-CM | POA: Diagnosis not present

## 2019-06-19 DIAGNOSIS — G3184 Mild cognitive impairment, so stated: Secondary | ICD-10-CM | POA: Diagnosis not present

## 2019-06-19 DIAGNOSIS — E559 Vitamin D deficiency, unspecified: Secondary | ICD-10-CM | POA: Diagnosis not present

## 2019-06-19 DIAGNOSIS — G4700 Insomnia, unspecified: Secondary | ICD-10-CM | POA: Diagnosis not present

## 2019-06-19 DIAGNOSIS — K219 Gastro-esophageal reflux disease without esophagitis: Secondary | ICD-10-CM | POA: Diagnosis not present

## 2019-06-19 DIAGNOSIS — G43909 Migraine, unspecified, not intractable, without status migrainosus: Secondary | ICD-10-CM | POA: Diagnosis not present

## 2019-06-19 DIAGNOSIS — E039 Hypothyroidism, unspecified: Secondary | ICD-10-CM | POA: Diagnosis not present

## 2019-06-19 DIAGNOSIS — R69 Illness, unspecified: Secondary | ICD-10-CM | POA: Diagnosis not present

## 2019-06-23 ENCOUNTER — Other Ambulatory Visit: Payer: Self-pay | Admitting: Adult Health

## 2019-06-23 DIAGNOSIS — Z760 Encounter for issue of repeat prescription: Secondary | ICD-10-CM

## 2019-06-27 ENCOUNTER — Encounter: Payer: Self-pay | Admitting: Family Medicine

## 2019-07-12 ENCOUNTER — Other Ambulatory Visit (HOSPITAL_COMMUNITY): Payer: Self-pay | Admitting: Psychiatry

## 2019-07-12 ENCOUNTER — Telehealth (HOSPITAL_COMMUNITY): Payer: Self-pay | Admitting: *Deleted

## 2019-07-12 MED ORDER — VENLAFAXINE HCL ER 37.5 MG PO CP24: 37.5000 mg | ORAL_CAPSULE | Freq: Every day | ORAL | 0 refills | Status: DC

## 2019-07-12 NOTE — Telephone Encounter (Signed)
Patient called  &  stated that she has a appt for 08/2019. And that her concern is a emotional thing now. Return call to husband's # [phone]. No complaints of depression or anxiety just that she finds herself getting easily upset  & well the [employer] get's emotional over the happiness going on she get's all teary eyed & starts crying for the happiness. Asking if she should go back on the Venlafaxine?

## 2019-07-12 NOTE — Telephone Encounter (Signed)
I think it would likely be the best to reinitiate venlafaxine. Ask her what dose she takes- if she takes venlafaxine 37.5 mg daily, would advise her to take 75 mg daily. If she is not taking venlafaxine, would first advise to take 37.5 mg daily. Let me know if she needs any refill. Also, advise her to contact the office if her mood worsens despite this change (Please explain to her that it would likely take at least a few weeks for medication to be effective)

## 2019-07-12 NOTE — Telephone Encounter (Signed)
Ordered venlafaxine 37.5 mg daily. For the record, has she bene taking 37.5 mg daily, or has she been off this medication?

## 2019-07-12 NOTE — Telephone Encounter (Signed)
Patient is out with husband & did discontinue med until it was gone. Not sure of date d/c after the  05/31/2019 appt

## 2019-07-12 NOTE — Telephone Encounter (Signed)
Patient called  &  stated that she has a appt for 08/2019. And that her concern is a emotional thing now. Return call to husband's # [phone]. No complaints of depression or anxiety just that she finds herself getting easily upset  & well the [employer] channel get's emotional over the happiness going on she get's all teary eyed & starts crying for the happiness. Asking if she should go back on the Venlafaxine?

## 2019-07-12 NOTE — Telephone Encounter (Signed)
PER PATIENT SHE WAS TOLD TO: Decrease venlafaxine37.5 mg dailyfor two weeks, then discontinue

## 2019-07-12 NOTE — Telephone Encounter (Signed)
Spoke with patient & informed per provider: think it would likely be the best to reinitiate venlafaxine. Ask her what dose she takes- if she takes venlafaxine 37.5 mg daily, would advise her to take 75 mg daily. If she is not taking venlafaxine, would first advise to take 37.5 mg daily. Let me know if she needs any refill. Also, advise her to contact the office if her mood worsens despite this change (Please explain to her that it would likely take at least a few weeks for medication to be effective)  PATIENT HESITATE ABOUT INCREASING THE VENLAFAXINE UP TO 75 MG. STATES THE 37.5 MG WORKED WELL BEFORE. AND REFILL FOR  37.5 MG WILL BE NEEDED

## 2019-07-13 ENCOUNTER — Ambulatory Visit (INDEPENDENT_AMBULATORY_CARE_PROVIDER_SITE_OTHER): Payer: Medicare HMO | Admitting: Adult Health

## 2019-07-13 ENCOUNTER — Encounter: Payer: Self-pay | Admitting: Adult Health

## 2019-07-13 ENCOUNTER — Other Ambulatory Visit: Payer: Self-pay

## 2019-07-13 VITALS — BP 126/76 | Temp 98.4°F | Wt 161.0 lb

## 2019-07-13 DIAGNOSIS — R238 Other skin changes: Secondary | ICD-10-CM | POA: Diagnosis not present

## 2019-07-13 DIAGNOSIS — Z23 Encounter for immunization: Secondary | ICD-10-CM | POA: Diagnosis not present

## 2019-07-13 NOTE — Progress Notes (Signed)
Subjective:    [name], female    DOB: [birth year], 78 y.o.   MRN: [id number]  HPI 78 year old female who  has a past medical history of Anxiety, Arthritis, Breast cancer ([place]) (08/08/13), Cataract, Chronic insomnia, Cluster headaches, Depression, Fibromyalgia, GERD (gastroesophageal reflux disease), H/O hiatal hernia, History of colonic polyps, History of transfusion (08/30/2013), radiation therapy (10/29/13- 12/14/13), Hypothyroidism, Irritable bowel syndrome, Kidney stone, Lymphedema, Macular degeneration, Osteopenia, Other abnormal glucose, Other and unspecified hyperlipidemia, Pain in joint, shoulder region, Pneumonia [id number]), Sleep apnea, and Stress incontinence, female.  She presents to the office today for 3 to 4 days of a tender lump in the left earlobe.  No other symptoms that she is noticed.   Review of Systems See HPI   Past Medical History:  Diagnosis Date  . Anxiety    PHOBIAS  . Arthritis   . Breast cancer ([place]) 08/08/13   right LOQ  . Cataract   . Chronic insomnia   . Cluster headaches    history of migraines / NONE FOR SEVERAL YRS  . Depression   . Fibromyalgia   . GERD (gastroesophageal reflux disease)   . H/O hiatal hernia   . History of colonic polyps   . History of transfusion 08/30/2013  . Hx of radiation therapy 10/29/13- 12/14/13   right chest wall 5040 cGy 28 sessions, right supraclavicular/axillary region 5040 cGy 28 sessions, right chest wall boost 1000 cGy 5 sessions  . Hypothyroidism   . Irritable bowel syndrome   . Kidney stone   . Lymphedema    RT ARM - WEARS SLEEVE  . Macular degeneration    hole/right eye  . Osteopenia   . Other abnormal glucose   . Other and unspecified hyperlipidemia   . Pain in joint, shoulder region   . Pneumonia [id number]  . Sleep apnea    USES C-PAP  . Stress incontinence, female     Social History   Socioeconomic History  . Marital status: Married    Spouse name: Not on file  . Number of  children: 3  . Years of education: Not on file  . Highest education level: Not on file  Occupational History  . Occupation: retired Medical laboratory scientific officer  . Financial resource strain: Not on file  . Food insecurity    Worry: Not on file    Inability: Not on file  . Transportation needs    Medical: Not on file    Non-medical: Not on file  Tobacco Use  . Smoking status: Former Smoker    Packs/day: 0.10    Years: 2.00    Pack years: 0.20    Types: Cigarettes    Start date: 11/16/1959    Quit date: 11/15/1960    Years since quitting: 58.6  . Smokeless tobacco: Never Used  Substance and Sexual Activity  . Alcohol use: No    Alcohol/week: 0.0 standard drinks  . Drug use: No  . Sexual activity: Not on file    Comment: menarche age 17, fist live birth 24, P 3, hysterectomy age 53, no HRT, BCP 2 yrs  Lifestyle  . Physical activity    Days per week: Not on file    Minutes per session: Not on file  . Stress: Not on file  Relationships  . Social Herbalist on phone: Not on file    Gets together: Not on file    Attends religious service: Not on file  Active member of club or organization: Not on file    Attends meetings of clubs or organizations: Not on file    Relationship status: Not on file  . Intimate partner violence    Fear of current or ex partner: Not on file    Emotionally abused: Not on file    Physically abused: Not on file    Forced sexual activity: Not on file  Other Topics Concern  . 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 ABDOMINAL HYSTERECTOMY    . APPENDECTOMY    . BILATERAL TOTAL MASTECTOMY WITH AXILLARY LYMPH NODE DISSECTION  08/30/2013   Dr Barry Dienes  . BREAST CYST ASPIRATION     9 cysts  . CATARACT EXTRACTION, BILATERAL  2005/2007  . CHOLECYSTECTOMY    . COLONOSCOPY    .  EVACUATION BREAST HEMATOMA Left 08/31/2013   Procedure: EVACUATION HEMATOMA BREAST;  Surgeon: Stark Klein, MD;  Location: [facility];  Service: General;  Laterality: Left;  . EYE SURGERY     to repair macular hole  . FOOT ARTHROPLASTY     lt   . GANGLION CYST EXCISION     rt foot  . HEMORRHOID SURGERY     03/1993  . JOINT REPLACEMENT  03/15/11   left knee replacement  . KNEE ARTHROSCOPY     /partial knee 2016/left knee 2012  . MASS EXCISION  11/04/2011   Procedure: EXCISION MASS;  Surgeon: Cammie Sickle., MD;  Location: [facility];  Service: Orthopedics;  Laterality: Right;  excisional biopsy right ulna mass  . MASTECTOMY W/ SENTINEL NODE BIOPSY Right 08/30/2013   Procedure: RIGHT  AXILLARY SENTINEL LYMPH NODE BIOPSY; Right Axillary Node Disection;  Surgeon: Stark Klein, MD;  Location: [facility];  Service: General;  Laterality: Right;  Right side nuc med 7:00   . PARTIAL KNEE ARTHROPLASTY Right 11/03/2015   Procedure: RIGHT KNEE MEDIAL UNICOMPARTMENTAL ARTHROPLASTY ;  Surgeon: Gaynelle Arabian, MD;  Location: [facility];  Service: Orthopedics;  Laterality: Right;  . SIMPLE MASTECTOMY WITH AXILLARY SENTINEL NODE BIOPSY Left 08/30/2013   Procedure: Bilateral Breast Mastectomy ;  Surgeon: Stark Klein, MD;  Location: [facility];  Service: General;  Laterality: Left;  . skin tags removed     breast, panty line, neckline  . TOE SURGERY     preventative crossover toe surg/right foot  . TOE SURGERY  2009   left foot/screw  in 2nd toe  . TONSILLECTOMY    . UPPER GASTROINTESTINAL ENDOSCOPY      Family History  Problem Relation Age of Onset  . Stroke Mother        died age 87  . Diabetes Mother   . Breast cancer Mother 76  . Breast cancer Sister 60  . Breast cancer Paternal Aunt 71  . Diabetes Maternal Grandfather   . Breast cancer Paternal Grandmother 78  . Breast cancer Paternal Aunt        dx in her 78s  . Cancer Maternal Grandmother        intra-abdominal cancer  . Brain  cancer Maternal Uncle 8  . Brain cancer Cousin 24       maternal cousin  . Brain cancer Cousin 20  paternal cousin  . Colon cancer Neg Hx     No Known Allergies  Current Outpatient Medications on File Prior to Visit  Medication Sig Dispense Refill  . Biotin 1000 MCG CHEW Chew by mouth.    . butalbital-acetaminophen-caffeine (FIORICET, ESGIC) 50-325-40 MG tablet Take 1-2 tablets by mouth every 6 (six) hours as needed for headache. 20 tablet 0  . Calcium Carb-Cholecalciferol (CALCIUM-VITAMIN D) 500-400 MG-UNIT TABS Take by mouth 2 (two) times daily.     . cholecalciferol (VITAMIN D) 1000 units tablet Take 2,000 Units by mouth daily.    Marland Kitchen co-enzyme Q-10 30 MG capsule Take 300 mg by mouth daily.    . diazepam (VALIUM) 5 MG tablet TAKE 1 TO 2 TABLETS BY MOUTH AT BEDTIME. TAKE 1 OR 2 PILLS 1 HOUR PRIOR TO DENTAL APPOINTMENT    . diclofenac sodium (VOLTAREN) 1 % GEL as needed.     Marland Kitchen esomeprazole (NEXIUM) 40 MG capsule Take 1 capsule by mouth  daily 90 capsule 3  . levothyroxine (SYNTHROID) 100 MCG tablet TAKE 1 TABLET BY MOUTH ONCE DAILY BEFORE BREAKFAST AND 1 TABLET IN THE MIDDLE OF THE NIGHT 90 tablet 0  . LUTEIN PO Take by mouth.    . meloxicam (MOBIC) 7.5 MG tablet Take 1 tablet by mouth twice daily as needed for pain 180 tablet 0  . methocarbamol (ROBAXIN) 500 MG tablet Take 500 mg by mouth as needed for muscle spasms.    Marland Kitchen omega-3 acid ethyl esters (LOVAZA) 1 g capsule Take 1 capsule by mouth twice daily 180 capsule 0  . ondansetron (ZOFRAN) 4 MG tablet Take 1 tablet (4 mg total) by mouth every 8 (eight) hours as needed for nausea or vomiting. 20 tablet 0  . oxyCODONE-acetaminophen (PERCOCET) 5-325 MG tablet Take by mouth as needed for severe pain.    . polyethylene glycol (MIRALAX / GLYCOLAX) packet Take 17 g by mouth daily.    . pregabalin (LYRICA) 75 MG capsule Take 1 capsule by mouth twice daily 180 capsule 0  . saccharomyces boulardii (FLORASTOR) 250 MG capsule Take 250 mg by  mouth daily.    . simvastatin (ZOCOR) 10 MG tablet TAKE 1 TABLET BY MOUTH AT BEDTIME 90 tablet 0  . temazepam (RESTORIL) 15 MG capsule Take 15 mg by mouth at bedtime as needed for sleep.    . traMADol (ULTRAM) 50 MG tablet Take by mouth as needed for moderate pain.    . TURMERIC PO Take 1,000 mg by mouth daily.    Marland Kitchen venlafaxine XR (EFFEXOR-XR) 37.5 MG 24 hr capsule Take 1 capsule (37.5 mg total) by mouth daily with breakfast. 14 capsule 0  . venlafaxine XR (EFFEXOR-XR) 37.5 MG 24 hr capsule Take 1 capsule (37.5 mg total) by mouth daily with breakfast. 90 capsule 0  . topiramate (TOPAMAX) 25 MG tablet Take 1 tablet (25 mg total) by mouth daily. 90 tablet 3   No current facility-administered medications on file prior to visit.     BP 126/76   Temp 98.4 F (36.9 C)   Wt 161 lb (73 kg)   BMI 32.52 kg/m       Objective:   Physical Exam Vitals signs and nursing note reviewed.  Constitutional:      Appearance: Normal appearance.  HENT:     Right Ear: Tympanic membrane and ear canal normal. There is no impacted cerumen.     Left Ear: Tympanic membrane, ear canal and external ear normal. There is no impacted cerumen.  Ears:     Comments: Small tender papule noted in left inner tragus with whitehead and localized erythema Neurological:     General: No focal deficit present.     Mental Status: She is alert and oriented to person, place, and time.  Psychiatric:        Mood and Affect: Mood normal.        Behavior: Behavior normal.        Thought Content: Thought content normal.        Judgment: Judgment normal.       Assessment & Plan:  1. Papule of skin -Advised that this was a simple pimple like she had back in July and will resolve on her own.  It is okay for her to apply Neosporin as needed.   [name], NP

## 2019-07-13 NOTE — Addendum Note (Signed)
Addended by: Miles Costain T on: 07/13/2019 11:10 AM   Modules accepted: Orders

## 2019-07-14 ENCOUNTER — Encounter: Payer: Self-pay | Admitting: Adult Health

## 2019-07-17 ENCOUNTER — Other Ambulatory Visit: Payer: Self-pay | Admitting: Adult Health

## 2019-07-17 DIAGNOSIS — R519 Headache, unspecified: Secondary | ICD-10-CM

## 2019-07-17 MED ORDER — BUTALBITAL-APAP-CAFFEINE 50-325-40 MG PO TABS: 1.0000 | ORAL_TABLET | Freq: Four times a day (QID) | ORAL | 1 refills | Status: DC | PRN

## 2019-08-01 DIAGNOSIS — L821 Other seborrheic keratosis: Secondary | ICD-10-CM | POA: Diagnosis not present

## 2019-08-01 DIAGNOSIS — Z23 Encounter for immunization: Secondary | ICD-10-CM | POA: Diagnosis not present

## 2019-08-01 DIAGNOSIS — L814 Other melanin hyperpigmentation: Secondary | ICD-10-CM | POA: Diagnosis not present

## 2019-08-01 DIAGNOSIS — D225 Melanocytic nevi of trunk: Secondary | ICD-10-CM | POA: Diagnosis not present

## 2019-08-17 ENCOUNTER — Other Ambulatory Visit: Payer: Self-pay | Admitting: Adult Health

## 2019-08-17 ENCOUNTER — Encounter: Payer: Self-pay | Admitting: Adult Health

## 2019-08-17 MED ORDER — TRAMADOL HCL 50 MG PO TABS: 50.0000 mg | ORAL_TABLET | ORAL | 0 refills | Status: DC | PRN

## 2019-08-20 ENCOUNTER — Telehealth: Payer: Self-pay

## 2019-08-20 NOTE — Progress Notes (Signed)
Virtual Visit via Telephone Note  I connected with Jean Nash on 08/27/19 at  1:20 PM EDT by telephone and verified that I am speaking with the correct person using two identifiers.   I discussed the limitations, risks, security and privacy concerns of performing an evaluation and management service by telephone and the availability of in person appointments. I also discussed with the patient that there may be a patient responsible charge related to this service. The patient expressed understanding and agreed to proceed.      I discussed the assessment and treatment plan with the patient. The patient was provided an opportunity to ask questions and all were answered. The patient agreed with the plan and demonstrated an understanding of the instructions.   The patient was advised to call back or seek an in-person evaluation if the symptoms worsen or if the condition fails to improve as anticipated.  I provided 13 minutes of non-face-to-face time during this encounter.   Norman Clay, MD    [facility] MD/PA/NP OP Progress Note  08/27/2019 1:48 PM Jean Nash  MRN:  [id number]  Chief Complaint:  Chief Complaint    Follow-up; Depression     HPI:  This is a follow-up appointment for depression.  She states that she was more snappy a few days after she discontinued venlafaxine. She feels fine after she reinitiated venlafaxine.  She does not have any concerns since then.  She is concerned about her extended family, who had COVID; they are doing better. She enjoys going out with her couple friend, who lives across the street. She also enjoys going shopping.  She states that she met a lady, who had separation. She feels appreciative of her life after hearing this lady's story. She denies feeling depressed. She sleeps 6-7 hours. She has motivation and energy.  She has good appetite.  Denies SI.  She denies anxiety.  She denies panic attacks.    Visit Diagnosis:    ICD-10-CM   1. MDD (major  depressive disorder), recurrent, in partial remission ([place])  F33.41     Past Psychiatric History: Please see initial evaluation for full details. I have reviewed the history. No updates at this time.     Past Medical History:  Past Medical History:  Diagnosis Date  . Anxiety    PHOBIAS  . Arthritis   . Breast cancer ([place]) 08/08/13   right LOQ  . Cataract   . Chronic insomnia   . Cluster headaches    history of migraines / NONE FOR SEVERAL YRS  . Depression   . Fibromyalgia   . GERD (gastroesophageal reflux disease)   . H/O hiatal hernia   . History of colonic polyps   . History of transfusion 08/30/2013  . Hx of radiation therapy 10/29/13- 12/14/13   right chest wall 5040 cGy 28 sessions, right supraclavicular/axillary region 5040 cGy 28 sessions, right chest wall boost 1000 cGy 5 sessions  . Hypothyroidism   . Irritable bowel syndrome   . Kidney stone   . Lymphedema    RT ARM - WEARS SLEEVE  . Macular degeneration    hole/right eye  . Osteopenia   . Other abnormal glucose   . Other and unspecified hyperlipidemia   . Pain in joint, shoulder region   . Pneumonia 3710,6269  . Sleep apnea    USES C-PAP  . Stress incontinence, female     Past Surgical History:  Procedure Laterality Date  . ABDOMINAL HYSTERECTOMY    . APPENDECTOMY    .  BILATERAL TOTAL MASTECTOMY WITH AXILLARY LYMPH NODE DISSECTION  08/30/2013   Dr Barry Dienes  . BREAST CYST ASPIRATION     9 cysts  . CATARACT EXTRACTION, BILATERAL  2005/2007  . CHOLECYSTECTOMY    . COLONOSCOPY    . EVACUATION BREAST HEMATOMA Left 08/31/2013   Procedure: EVACUATION HEMATOMA BREAST;  Surgeon: Stark Klein, MD;  Location: [facility];  Service: General;  Laterality: Left;  . EYE SURGERY     to repair macular hole  . FOOT ARTHROPLASTY     lt   . GANGLION CYST EXCISION     rt foot  . HEMORRHOID SURGERY     03/1993  . JOINT REPLACEMENT  03/15/11   left knee replacement  . KNEE ARTHROSCOPY     /partial knee 2016/left knee 2012   . MASS EXCISION  11/04/2011   Procedure: EXCISION MASS;  Surgeon: Cammie Sickle., MD;  Location: [facility];  Service: Orthopedics;  Laterality: Right;  excisional biopsy right ulna mass  . MASTECTOMY W/ SENTINEL NODE BIOPSY Right 08/30/2013   Procedure: RIGHT  AXILLARY SENTINEL LYMPH NODE BIOPSY; Right Axillary Node Disection;  Surgeon: Stark Klein, MD;  Location: [facility];  Service: General;  Laterality: Right;  Right side nuc med 7:00   . PARTIAL KNEE ARTHROPLASTY Right 11/03/2015   Procedure: RIGHT KNEE MEDIAL UNICOMPARTMENTAL ARTHROPLASTY ;  Surgeon: Gaynelle Arabian, MD;  [facility];  Service: Orthopedics;  Laterality: Right;  . SIMPLE MASTECTOMY WITH AXILLARY SENTINEL NODE BIOPSY Left 08/30/2013   Procedure: Bilateral Breast Mastectomy ;  Surgeon: Stark Klein, MD;  Location: [facility];  Service: General;  Laterality: Left;  . skin tags removed     breast, panty line, neckline  . TOE SURGERY     preventative crossover toe surg/right foot  . TOE SURGERY  2009   left foot/screw  in 2nd toe  . TONSILLECTOMY    . UPPER GASTROINTESTINAL ENDOSCOPY      Family Psychiatric History: Please see initial evaluation for full details. I have reviewed the history. No updates at this time.     Family History:  Family History  Problem Relation Age of Onset  . Stroke Mother        died age 19  . Diabetes Mother   . Breast cancer Mother 21  . Breast cancer Sister 73  . Breast cancer Paternal Aunt 43  . Diabetes Maternal Grandfather   . Breast cancer Paternal Grandmother 59  . Breast cancer Paternal Aunt        dx in her 20s  . Cancer Maternal Grandmother        intra-abdominal cancer  . Brain cancer Maternal Uncle 8  . Brain cancer Cousin 35       maternal cousin  . Brain cancer Cousin 20       paternal cousin  . Colon cancer Neg Hx     Social History:  Social History   Socioeconomic History  . Marital status: Married    Spouse name: Not on file  . Number of  children: 3  . Years of education: Not on file  . Highest education level: Not on file  Occupational History  . Occupation: retired Medical laboratory scientific officer  . Financial resource strain: Not on file  . Food insecurity    Worry: Not on file    Inability: Not on file  . Transportation needs    Medical: Not on file    Non-medical: Not on file  Tobacco Use  . Smoking status: Former Smoker    Packs/day: 0.10    Years: 2.00    Pack years: 0.20    Types: Cigarettes    Start date: 11/16/1959    Quit date: 11/15/1960    Years since quitting: 58.8  . Smokeless tobacco: Never Used  Substance and Sexual Activity  . Alcohol use: No    Alcohol/week: 0.0 standard drinks  . Drug use: No  . Sexual activity: Not on file    Comment: menarche age 42, fist live birth 20, P 3, hysterectomy age 72, no HRT, BCP 2 yrs  Lifestyle  . Physical activity    Days per week: Not on file    Minutes per session: Not on file  . Stress: Not on file  Relationships  . Social Herbalist on phone: Not on file    Gets together: Not on file    Attends religious service: Not on file    Active member of club or organization: Not on file    Attends meetings of clubs or organizations: Not on file    Relationship status: Not on file  Other Topics Concern  . Not on file  Social History Narrative   Occupation:  Retired Radiation protection practitioner    Married with 3 grown children      Never Smoked     Alcohol use-no         Allergies: No Known Allergies  Metabolic Disorder Labs: Lab Results  Component Value Date   HGBA1C 6.0 07/14/2017   MPG 126 (H) 08/27/2013   MPG 117 (H) 06/22/2013   Lab Results  Component Value Date   PROLACTIN 3.9 07/22/2009   Lab Results  Component Value Date   CHOL 164 07/20/2018   TRIG 105.0 07/20/2018   HDL 46.50 07/20/2018   CHOLHDL 4 07/20/2018   VLDL 21.0 07/20/2018   LDLCALC 96 07/20/2018   LDLCALC 105 (H) 07/14/2017   Lab Results  Component Value Date   TSH 0.84  07/20/2018   TSH 0.75 07/14/2017    Therapeutic Level Labs: No results found for: LITHIUM No results found for: VALPROATE No components found for:  CBMZ  Current Medications: Current Outpatient Medications  Medication Sig Dispense Refill  . Biotin 1000 MCG CHEW Chew by mouth.    . butalbital-acetaminophen-caffeine (FIORICET) 50-325-40 MG tablet Take 1-2 tablets by mouth every 6 (six) hours as needed for headache. 20 tablet 1  . Calcium Carb-Cholecalciferol (CALCIUM-VITAMIN D) 500-400 MG-UNIT TABS Take by mouth 2 (two) times daily.     . cholecalciferol (VITAMIN D) 1000 units tablet Take 2,000 Units by mouth daily.    Marland Kitchen co-enzyme Q-10 30 MG capsule Take 300 mg by mouth daily.    . diazepam (VALIUM) 5 MG tablet TAKE 1 TO 2 TABLETS BY MOUTH AT BEDTIME. TAKE 1 OR 2 PILLS 1 HOUR PRIOR TO DENTAL APPOINTMENT    . diclofenac sodium (VOLTAREN) 1 % GEL as needed.     Marland Kitchen esomeprazole (NEXIUM) 40 MG capsule Take 1 capsule by mouth  daily 90 capsule 3  . levothyroxine (SYNTHROID) 100 MCG tablet TAKE 1 TABLET BY MOUTH ONCE DAILY BEFORE BREAKFAST AND 1 TABLET IN THE MIDDLE OF THE NIGHT 90 tablet 0  . LUTEIN PO Take by mouth.    . meloxicam (MOBIC) 7.5 MG tablet Take 1 tablet by mouth twice daily as needed for pain 180 tablet 0  . methocarbamol (ROBAXIN) 500 MG tablet Take 500 mg by mouth as  needed for muscle spasms.    Marland Kitchen omega-3 acid ethyl esters (LOVAZA) 1 g capsule Take 1 capsule by mouth twice daily 180 capsule 0  . ondansetron (ZOFRAN) 4 MG tablet Take 1 tablet (4 mg total) by mouth every 8 (eight) hours as needed for nausea or vomiting. 20 tablet 0  . oxyCODONE-acetaminophen (PERCOCET) 5-325 MG tablet Take by mouth as needed for severe pain.    . polyethylene glycol (MIRALAX / GLYCOLAX) packet Take 17 g by mouth daily.    . pregabalin (LYRICA) 75 MG capsule Take 1 capsule by mouth twice daily 180 capsule 0  . saccharomyces boulardii (FLORASTOR) 250 MG capsule Take 250 mg by mouth daily.    .  simvastatin (ZOCOR) 10 MG tablet TAKE 1 TABLET BY MOUTH AT BEDTIME 90 tablet 0  . temazepam (RESTORIL) 15 MG capsule Take 15 mg by mouth at bedtime as needed for sleep.    Marland Kitchen topiramate (TOPAMAX) 25 MG tablet Take 1 tablet (25 mg total) by mouth daily. 90 tablet 3  . traMADol (ULTRAM) 50 MG tablet Take 1 tablet (50 mg total) by mouth every 12 (twelve) hours as needed for moderate pain. 30 tablet 0  . TURMERIC PO Take 1,000 mg by mouth daily.    Derrill Memo ON 10/12/2019] venlafaxine XR (EFFEXOR-XR) 37.5 MG 24 hr capsule Take 1 capsule (37.5 mg total) by mouth daily with breakfast. 90 capsule 1   No current facility-administered medications for this visit.      Musculoskeletal: Strength & Muscle Tone: N/A Gait & Station: N/A Patient leans: N/A  Psychiatric Specialty Exam: Review of Systems  Psychiatric/Behavioral: Negative for depression, hallucinations, memory loss, substance abuse and suicidal ideas. The patient is not nervous/anxious and does not have insomnia.   All other systems reviewed and are negative.   There were no vitals taken for this visit.There is no height or weight on file to calculate BMI.  General Appearance: Fairly Groomed  Eye Contact:  Good  Speech:  Clear and Coherent  Volume:  Normal  Mood:  "good"  Affect:  NA  Thought Process:  Coherent  Orientation:  Full (Time, Place, and Person)  Thought Content: Logical   Suicidal Thoughts:  No  Homicidal Thoughts:  No  Memory:  Immediate;   Good  Judgement:  Good  Insight:  Fair  Psychomotor Activity:  Normal  Concentration:  Concentration: Good and Attention Span: Good  Recall:  Good  Fund of Knowledge: Good  Language: Good  Akathisia:  No  Handed:  Right  AIMS (if indicated): not done  Assets:  Communication Skills Desire for Improvement  ADL's:  Intact  Cognition: WNL  Sleep:  Good   Screenings: PHQ2-9     Office Visit from 07/20/2018 in [place] at [facility] from 07/14/2017 in  [place] at [facility] from 11/28/2015 in [place] at [facility] from 10/14/2014 in [place] at [facility] from 10/01/2013 in [place] at [facility] Total Score  0  0  0  1  0  PHQ-9 Total Score  -  5  -  -  -       Assessment and Plan:  Jean Nash is a 78 y.o. year old female with a history of depression, anxiety,breast cancer s/p mastectomy , who presents for follow up appointment for MDD (major depressive disorder), recurrent, in partial remission ([place])  # MDD, recurrent in partial remission She denies significant mood symptoms after reinitiating  venlafaxine from lower dose.  She reportedly had worsening in irritability when she tried to taper off venlafaxine. Although it is unclear whether it was more related to discontinuation syndrome, will continue current those of venlafaxine at times time to avoid relapse in depression.  She agrees with this plan.  Discussed behavioral activation.   Plan 1. Continue venlafaxine 37.5 mg daily  2. Next appointment: in January, video [email or web address]  The patient demonstrates the following risk factors for suicide: Chronic risk factors for suicide include:psychiatric disorder ofdepression. Acute risk factorsfor suicide include: N/A. Protective factorsfor this patient include: positive social support, coping skills and hope for the future. Considering these factors, the overall suicide risk at this point appears to below. Patientisappropriate for outpatient follow up.   Norman Clay, MD 08/27/2019, 1:48 PM

## 2019-08-20 NOTE — Telephone Encounter (Signed)
Copied from [place]. Topic: General - Inquiry >> Aug 20, 2019  1:03 PM Percell Belt A wrote: Reason for CRM: [facility] on friendly called and stated they need clarification on the instruction for the  traMADol (ULTRAM) 50 MG tablet [id number]  Best number  [phone]

## 2019-08-21 ENCOUNTER — Encounter: Payer: Self-pay | Admitting: Adult Health

## 2019-08-21 ENCOUNTER — Other Ambulatory Visit: Payer: Self-pay | Admitting: Adult Health

## 2019-08-21 MED ORDER — TRAMADOL HCL 50 MG PO TABS: 50.0000 mg | ORAL_TABLET | Freq: Two times a day (BID) | ORAL | 0 refills | Status: DC | PRN

## 2019-08-23 ENCOUNTER — Encounter: Payer: Medicare HMO | Admitting: Adult Health

## 2019-08-27 ENCOUNTER — Other Ambulatory Visit: Payer: Self-pay

## 2019-08-27 ENCOUNTER — Ambulatory Visit (INDEPENDENT_AMBULATORY_CARE_PROVIDER_SITE_OTHER): Payer: Medicare HMO | Admitting: Psychiatry

## 2019-08-27 ENCOUNTER — Encounter (HOSPITAL_COMMUNITY): Payer: Self-pay | Admitting: Psychiatry

## 2019-08-27 DIAGNOSIS — R69 Illness, unspecified: Secondary | ICD-10-CM | POA: Diagnosis not present

## 2019-08-27 DIAGNOSIS — F3341 Major depressive disorder, recurrent, in partial remission: Secondary | ICD-10-CM | POA: Diagnosis not present

## 2019-08-27 MED ORDER — VENLAFAXINE HCL ER 37.5 MG PO CP24: 37.5000 mg | ORAL_CAPSULE | Freq: Every day | ORAL | 1 refills | Status: DC

## 2019-09-14 ENCOUNTER — Encounter: Payer: Medicare HMO | Admitting: Adult Health

## 2019-09-19 IMAGING — US US SOFT TISSUE HEAD/NECK
1 series · 12 of 12 positions shown · non-contrast
Comparison: Thyroid sonogram December 07, 2013

CLINICAL DATA: RIGHT neck lymphadenopathy for 2-3 weeks.

EXAM:
ULTRASOUND OF HEAD/NECK SOFT TISSUES
TECHNIQUE: Ultrasound examination of the head and neck soft tissues was
performed in the area of clinical concern (RIGHT submandibular soft
tissues)..

[Series 1: us soft tissue head/neck · 0.06mm/px · 12 of 12 slices shown]
[im 1/12]
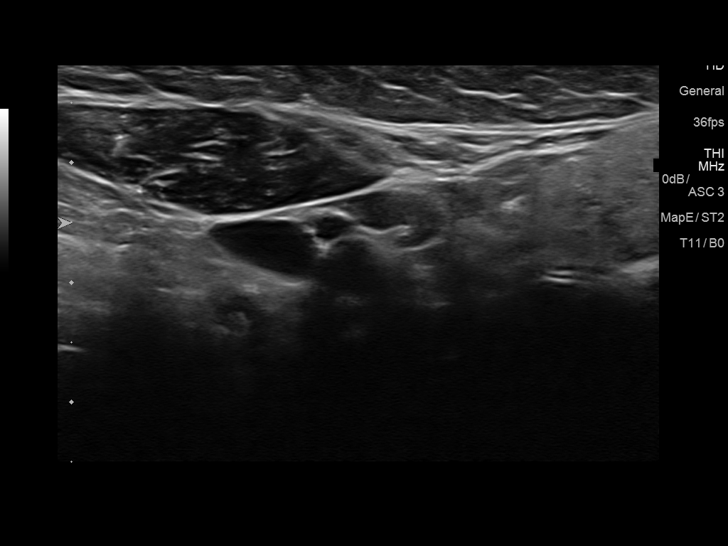
[im 2/12]
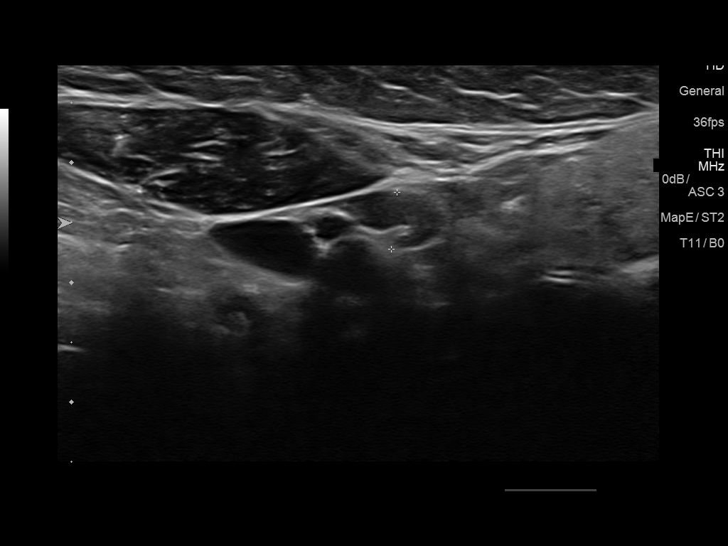
[im 3/12]
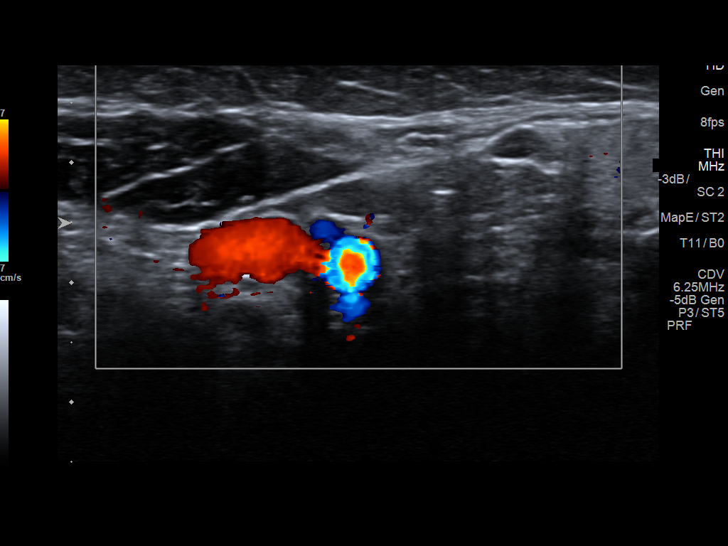
[im 4/12]
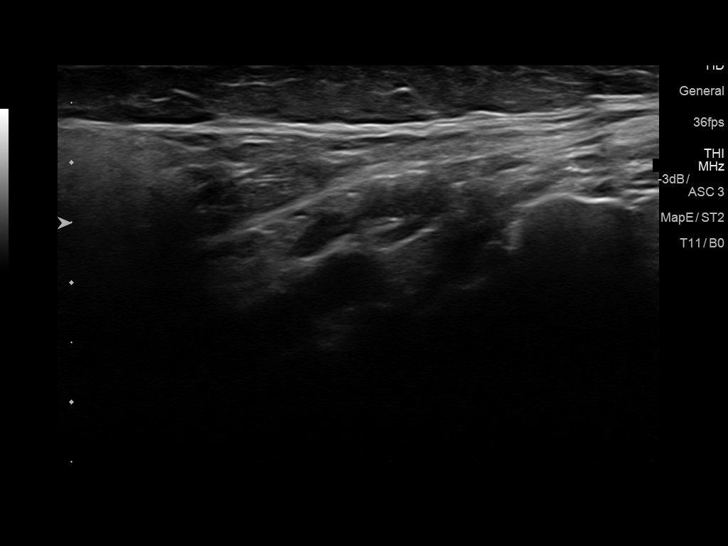
[im 5/12]
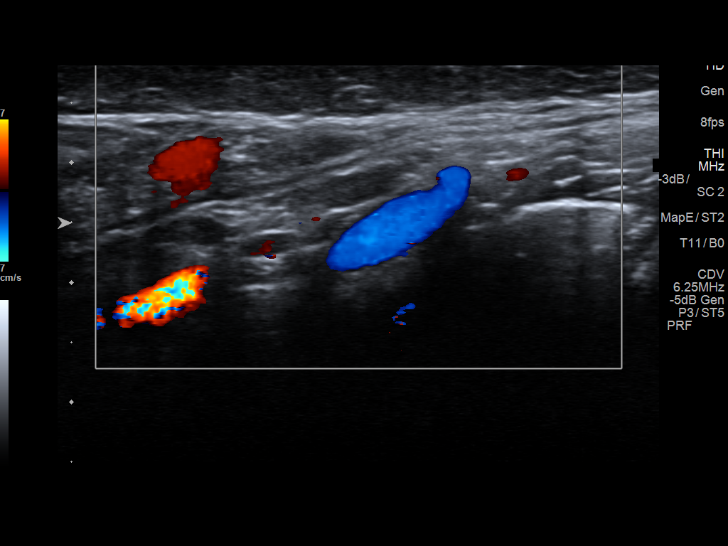
[im 6/12]
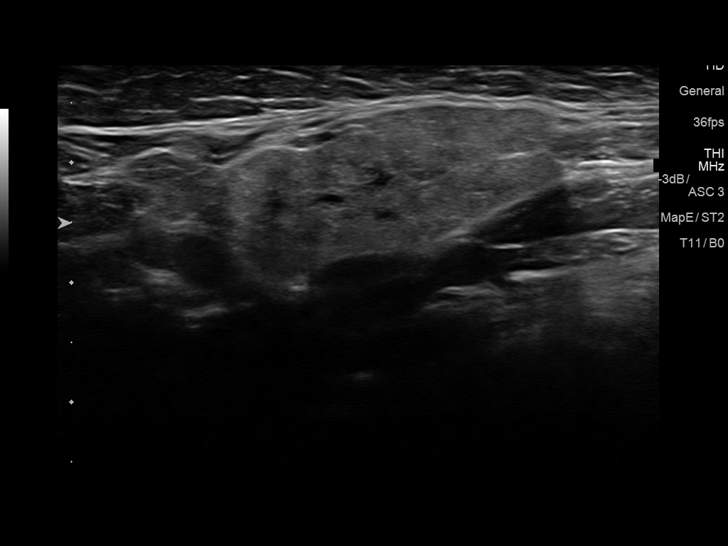
[im 7/12]
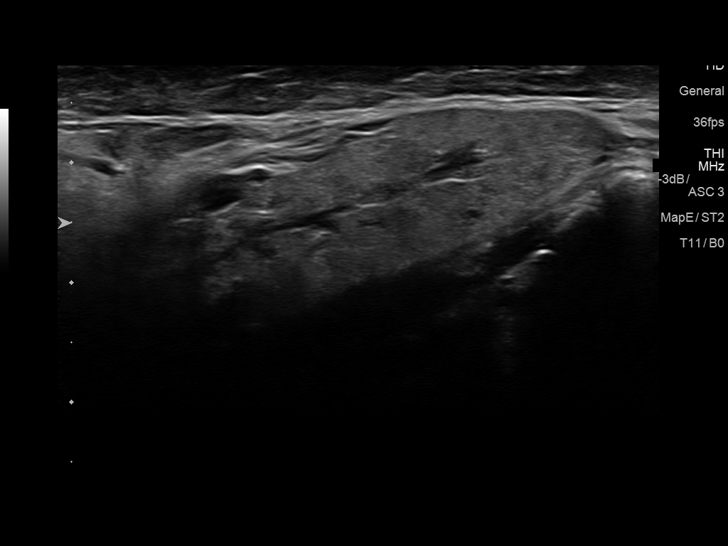
[im 8/12]
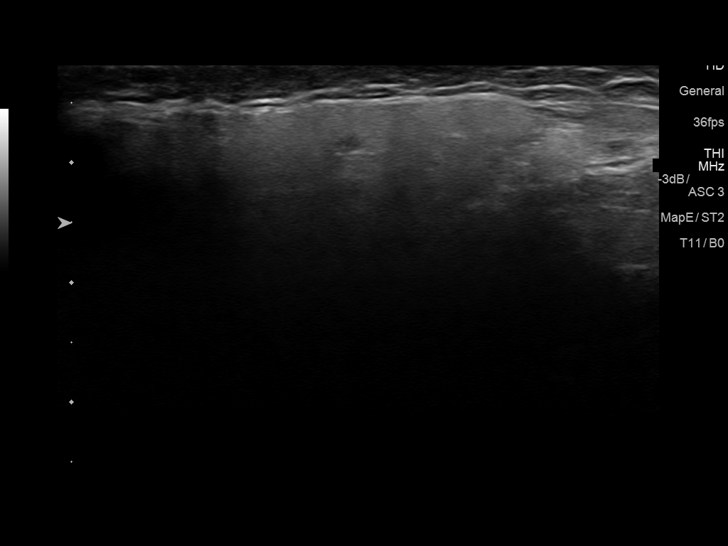
[im 9/12]
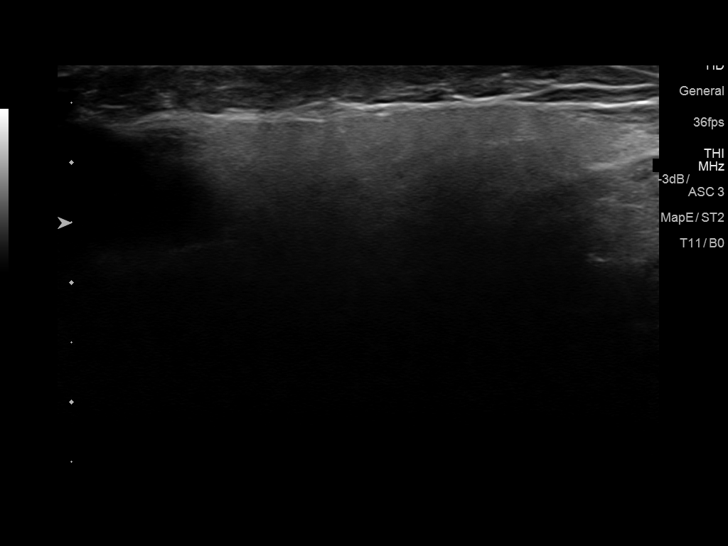
[im 10/12]
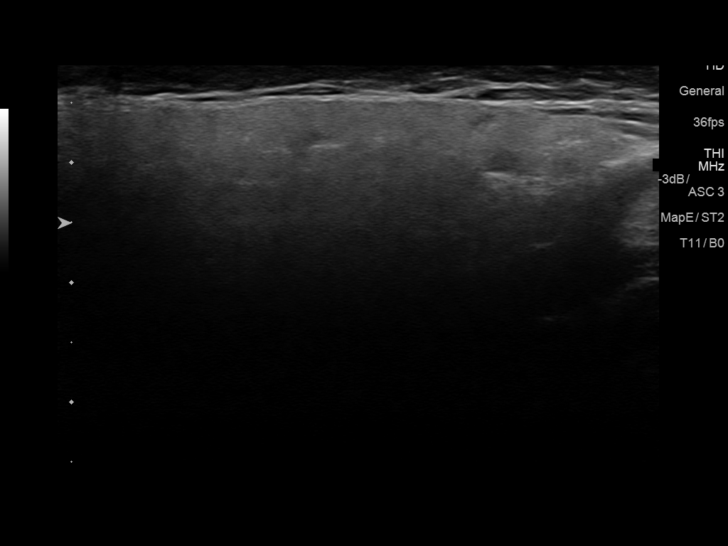
[im 11/12]
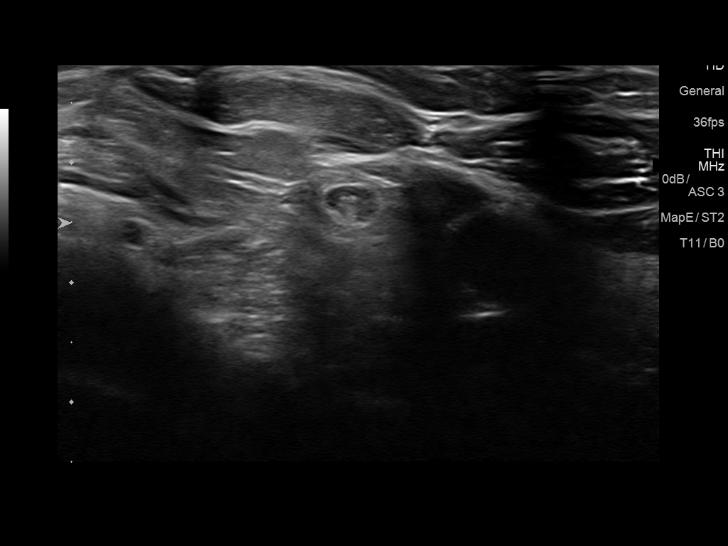
[im 12/12]
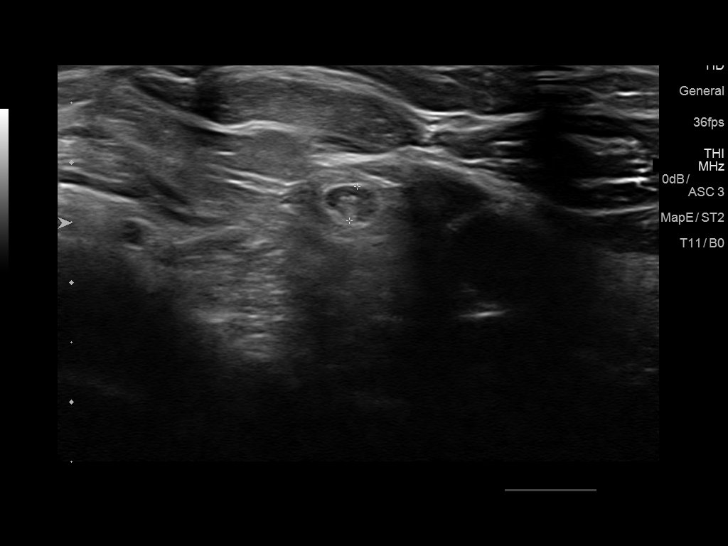

[12 of 12 positions shown; findings below may reference images not displayed]

FINDINGS: Reniform lymph nodes with echogenic fatty hila measuring less than 5
mm short axis are normal. Normal sonographic appearance of RIGHT
submandibular gland. No suspicious mass, fluid collections or
abnormal vascularity.
IMPRESSION: Negative ultrasound of RIGHT submandibular soft tissues.

## 2019-09-25 ENCOUNTER — Encounter: Payer: Self-pay | Admitting: Adult Health

## 2019-10-04 ENCOUNTER — Encounter: Payer: Self-pay | Admitting: Adult Health

## 2019-10-05 ENCOUNTER — Telehealth (INDEPENDENT_AMBULATORY_CARE_PROVIDER_SITE_OTHER): Payer: Medicare HMO | Admitting: Family Medicine

## 2019-10-05 ENCOUNTER — Encounter: Payer: Self-pay | Admitting: Family Medicine

## 2019-10-05 ENCOUNTER — Other Ambulatory Visit: Payer: Medicare HMO

## 2019-10-05 ENCOUNTER — Other Ambulatory Visit: Payer: Self-pay | Admitting: Adult Health

## 2019-10-05 ENCOUNTER — Other Ambulatory Visit: Payer: Self-pay

## 2019-10-05 VITALS — Temp 97.4°F | Ht 59.0 in

## 2019-10-05 DIAGNOSIS — R350 Frequency of micturition: Secondary | ICD-10-CM

## 2019-10-05 DIAGNOSIS — R7303 Prediabetes: Secondary | ICD-10-CM | POA: Diagnosis not present

## 2019-10-05 DIAGNOSIS — N390 Urinary tract infection, site not specified: Secondary | ICD-10-CM | POA: Diagnosis not present

## 2019-10-05 LAB — POCT GLYCOSYLATED HEMOGLOBIN (HGB A1C): Hemoglobin A1C: 5.4 % (ref 4.0–5.6)

## 2019-10-05 MED ORDER — TRAMADOL HCL 50 MG PO TABS: 50.0000 mg | ORAL_TABLET | Freq: Two times a day (BID) | ORAL | 2 refills | Status: DC | PRN

## 2019-10-05 MED ORDER — SULFAMETHOXAZOLE-TRIMETHOPRIM 800-160 MG PO TABS: 1.0000 | ORAL_TABLET | Freq: Two times a day (BID) | ORAL | 0 refills | Status: DC

## 2019-10-05 MED ORDER — SULFAMETHOXAZOLE-TRIMETHOPRIM 800-160 MG PO TABS: 1.0000 | ORAL_TABLET | Freq: Two times a day (BID) | ORAL | 0 refills | Status: AC

## 2019-10-05 NOTE — Progress Notes (Signed)
Virtual Visit via Telephone Note  I connected with Jean Nash on 10/05/19 at  3:00 PM EST by telephone and verified that I am speaking with the correct person using two identifiers.   I discussed the limitations, risks, security and privacy concerns of performing an evaluation and management service by telephone and the availability of in person appointments. I also discussed with the patient that there may be a patient responsible charge related to this service. The patient expressed understanding and agreed to proceed.  Location patient: home Location provider: work office Participants present for the call: patient, provider Patient did not have a visit in the prior 7 days to address this/these issue(s).   History of Present Illness: Jean Nash is a 78 years old female with history of IBS, hypothyroidism, fibromyalgia, and depression who is complaining about 2 days of increased urinary frequency. States that she is urinating "every hour."   She has had UTI in the past. Nocturia 3-4 times instead  nocturia 1-2 ,which she considers her normal. She usually urinates a good amount of urine. Negative for dysuria, gross hematuria, odorous or cloudy urine. + Urgency. Urine incontinence, which is chronic, she has to wear pads..  She has not tried OTC medications.  She denies fever, chills, unusual fatigue, abdominal pain, nausea, vomiting, changes in bowel habits, or blood in the stool. She states that she has had constipation "but not now", last bowel movement 2 to 3 days ago.  Denies polydipsia or polyphagia.  Lab Results  Component Value Date   HGBA1C 6.0 07/14/2017    Observations/Objective: Patient sounds cheerful and well on the phone. I do not appreciate any SOB. Speech and thought processing are grossly intact. Patient reported vitals:  Assessment and Plan: 1. Urine frequency Possible etiologies discussed, including non infectious process.  She will come to collect  a urine sample.  - Urinalysis, Routine w reflex microscopic - Culture, Urine  2. Urinary tract infection without hematuria, site unspecified Empiric abx treatment started today and will be tailored according to Ucx results and susceptibility report. Side effects of abx discussed. Clearly instructed about warning signs. F/U if symptoms persist.  - sulfamethoxazole-trimethoprim (BACTRIM DS) 800-160 MG tablet; Take 1 tablet by mouth 2 (two) times daily for 3 days.  Dispense: 6 tablet; Refill: 0  3. Prediabetes Further recommendations according to A1C.  - POC HgB A1c   Follow Up Instructions:  Return if symptoms worsen or fail to improve.  I did not refer this patient for an OV in the next 24 hours for this/these issue(s).  I discussed the assessment and treatment plan with the patient. She was provided an opportunity to ask questions and all were answered. She agreed with the plan and demonstrated an understanding of the instructions.   The patient was advised to call back or seek an in-person evaluation if the symptoms worsen or if the condition fails to improve as anticipated.  I provided 12 minutes of non-face-to-face time during this encounter.   Kaileena Obi [place], MD

## 2019-10-06 ENCOUNTER — Encounter: Payer: Self-pay | Admitting: Family Medicine

## 2019-10-07 LAB — URINALYSIS, ROUTINE W REFLEX MICROSCOPIC
Bilirubin Urine: NEGATIVE
Glucose, UA: NEGATIVE
Hgb urine dipstick: NEGATIVE
Hyaline Cast: NONE SEEN /LPF
Ketones, ur: NEGATIVE
Nitrite: NEGATIVE
Protein, ur: NEGATIVE
RBC / HPF: NONE SEEN /HPF (ref 0–2)
Specific Gravity, Urine: 1.013 (ref 1.001–1.03)
pH: 6.5 (ref 5.0–8.0)

## 2019-10-07 LAB — URINE CULTURE
MICRO NUMBER:: 1124285
SPECIMEN QUALITY:: ADEQUATE

## 2019-10-09 ENCOUNTER — Encounter: Payer: Self-pay | Admitting: Family Medicine

## 2019-10-14 ENCOUNTER — Other Ambulatory Visit: Payer: Self-pay | Admitting: Adult Health

## 2019-10-14 DIAGNOSIS — Z760 Encounter for issue of repeat prescription: Secondary | ICD-10-CM

## 2019-11-01 ENCOUNTER — Telehealth: Payer: Self-pay | Admitting: *Deleted

## 2019-11-01 ENCOUNTER — Other Ambulatory Visit: Payer: Self-pay

## 2019-11-01 ENCOUNTER — Other Ambulatory Visit: Payer: Self-pay | Admitting: Family Medicine

## 2019-11-01 ENCOUNTER — Other Ambulatory Visit (INDEPENDENT_AMBULATORY_CARE_PROVIDER_SITE_OTHER): Payer: Medicare HMO

## 2019-11-01 ENCOUNTER — Ambulatory Visit (INDEPENDENT_AMBULATORY_CARE_PROVIDER_SITE_OTHER)
Admission: RE | Admit: 2019-11-01 | Discharge: 2019-11-01 | Disposition: A | Discharge status: Home / Self Care | Payer: Medicare HMO | Source: Ambulatory Visit

## 2019-11-01 DIAGNOSIS — Z87440 Personal history of urinary (tract) infections: Secondary | ICD-10-CM | POA: Diagnosis not present

## 2019-11-01 DIAGNOSIS — R350 Frequency of micturition: Secondary | ICD-10-CM

## 2019-11-01 DIAGNOSIS — N390 Urinary tract infection, site not specified: Secondary | ICD-10-CM

## 2019-11-01 MED ORDER — SULFAMETHOXAZOLE-TRIMETHOPRIM 800-160 MG PO TABS: 1.0000 | ORAL_TABLET | Freq: Two times a day (BID) | ORAL | 0 refills | Status: AC

## 2019-11-01 NOTE — Telephone Encounter (Signed)
Pt came to the office earlier.  Orders entered.  Nothing further needed.

## 2019-11-01 NOTE — ED Provider Notes (Signed)
Virtual Visit via Video Note:  Acadia Pahls  initiated request for Telemedicine visit with [facility] team. I connected with Jean Nash  on 11/01/2019 at 2:22 PM  for a synchronized telemedicine visit using a video enabled HIPPA compliant telemedicine application. I verified that I am speaking with Jean Nash  using two identifiers. Atlas Crossland C Mariaha Ellington, PA-C  was physically located in a [facility]gent care site and Shany Cabacungan was located at a different location.   The limitations of evaluation and management by telemedicine as well as the availability of in-person appointments were discussed. Patient was informed that she  may incur a bill ( including co-pay) for this virtual visit encounter. Jean Nash  expressed understanding and gave verbal consent to proceed with virtual visit.     History of Present Illness:Jean Nash  is a 78 y.o. female presents for evaluation of possible UTI.  Patient states that over the past 2 days she has had increased urinary frequency and symptoms similar to past UTIs.  Patient recently had positive urine culture for UTI approximately 3 weeks ago.  They were various resistance patterns, and was treated with Bactrim x3 days.  She denies any fevers, nausea or vomiting.  Denies abdominal pain.  Denies dysuria.  Denies history of diabetes.   No Known Allergies   Past Medical History:  Diagnosis Date  . Anxiety    PHOBIAS  . Arthritis   . Breast cancer ([place]) 08/08/13   right LOQ  . Cataract   . Chronic insomnia   . Cluster headaches    history of migraines / NONE FOR SEVERAL YRS  . Depression   . Fibromyalgia   . GERD (gastroesophageal reflux disease)   . H/O hiatal hernia   . History of colonic polyps   . History of transfusion 08/30/2013  . Hx of radiation therapy 10/29/13- 12/14/13   right chest wall 5040 cGy 28 sessions, right supraclavicular/axillary region 5040 cGy 28 sessions, right chest wall boost 1000 cGy 5 sessions   . Hypothyroidism   . Irritable bowel syndrome   . Kidney stone   . Lymphedema    RT ARM - WEARS SLEEVE  . Macular degeneration    hole/right eye  . Osteopenia   . Other abnormal glucose   . Other and unspecified hyperlipidemia   . Pain in joint, shoulder region   . Pneumonia [id number]  . Sleep apnea    USES C-PAP  . Stress incontinence, female      Social History   Tobacco Use  . Smoking status: Former Smoker    Packs/day: 0.10    Years: 2.00    Pack years: 0.20    Types: Cigarettes    Start date: 11/16/1959    Quit date: 11/15/1960    Years since quitting: 59.0  . Smokeless tobacco: Never Used  Substance Use Topics  . Alcohol use: No    Alcohol/week: 0.0 standard drinks  . Drug use: No        Observations/Objective: Physical Exam  Constitutional: She is oriented to person, place, and time and well-developed, well-nourished, and in no distress. No distress.  HENT:  Head: Normocephalic and atraumatic.  Pulmonary/Chest: Effort normal. No respiratory distress.  Speaking in full sentences  Neurological: She is alert and oriented to person, place, and time.  Speech clear, face symmetric     Assessment and Plan:    ICD-10-CM   1. Urinary tract infection without hematuria, site unspecified  N39.0  Will repeat treatment for UTI.  Basing therapy on past culture, avoiding Macrobid given age, has resistance to cefazolin, will avoid Keflex.  Based off remaining oral options will retreat with Bactrim but will provide 7-day course instead of 3-day.  Did discuss that it would be ideal to obtain urine culture prior to beginning antibiotics.  Patient plans to call PCP or may follow-up here at the urgent care.   Follow Up Instructions:     I discussed the assessment and treatment plan with the patient. The patient was provided an opportunity to ask questions and all were answered. The patient agreed with the plan and demonstrated an understanding of the instructions.    The patient was advised to call back or seek an in-person evaluation if the symptoms worsen or if the condition fails to improve as anticipated.      Janith Lima, PA-C  11/01/2019 2:22 PM         Janith Lima, PA-C 11/01/19 1423

## 2019-11-01 NOTE — Discharge Instructions (Signed)
Bactrim twice daily for 1 week

## 2019-11-01 NOTE — Telephone Encounter (Signed)
Copied from [place] [id number]. Topic: General - Other >> Nov 01, 2019  2:25 PM Leward Quan A wrote: Reason for CRM: Patient husband called to say that she had a virtual visit with [facility] and was informed to contact her PCP to come give a urine sample for a UTI. Please call Ph#  [phone]

## 2019-11-02 LAB — URINALYSIS
Bilirubin Urine: NEGATIVE
Hgb urine dipstick: NEGATIVE
Ketones, ur: NEGATIVE
Leukocytes,Ua: NEGATIVE
Nitrite: NEGATIVE
Specific Gravity, Urine: 1.02 (ref 1.000–1.030)
Total Protein, Urine: NEGATIVE
Urine Glucose: NEGATIVE
Urobilinogen, UA: 0.2 (ref 0.0–1.0)
pH: 7 (ref 5.0–8.0)

## 2019-11-03 LAB — URINE CULTURE
MICRO NUMBER:: 1208711
SPECIMEN QUALITY:: ADEQUATE

## 2019-11-04 ENCOUNTER — Other Ambulatory Visit: Payer: Self-pay | Admitting: Adult Health

## 2019-11-04 MED ORDER — FOSFOMYCIN TROMETHAMINE 3 G PO PACK: 3.0000 g | PACK | Freq: Once | ORAL | 0 refills | Status: AC

## 2019-11-05 ENCOUNTER — Other Ambulatory Visit: Payer: Self-pay | Admitting: *Deleted

## 2019-11-13 ENCOUNTER — Encounter: Payer: Self-pay | Admitting: Adult Health

## 2019-11-15 ENCOUNTER — Other Ambulatory Visit: Payer: Self-pay

## 2019-11-15 ENCOUNTER — Ambulatory Visit (INDEPENDENT_AMBULATORY_CARE_PROVIDER_SITE_OTHER): Payer: Medicare HMO | Admitting: Adult Health

## 2019-11-15 ENCOUNTER — Encounter: Payer: Self-pay | Admitting: Adult Health

## 2019-11-15 VITALS — BP 110/74 | Wt 168.0 lb

## 2019-11-15 DIAGNOSIS — Z87440 Personal history of urinary (tract) infections: Secondary | ICD-10-CM

## 2019-11-15 DIAGNOSIS — M2550 Pain in unspecified joint: Secondary | ICD-10-CM

## 2019-11-15 DIAGNOSIS — K219 Gastro-esophageal reflux disease without esophagitis: Secondary | ICD-10-CM | POA: Diagnosis not present

## 2019-11-15 DIAGNOSIS — F329 Major depressive disorder, single episode, unspecified: Secondary | ICD-10-CM

## 2019-11-15 DIAGNOSIS — Z8669 Personal history of other diseases of the nervous system and sense organs: Secondary | ICD-10-CM

## 2019-11-15 DIAGNOSIS — R350 Frequency of micturition: Secondary | ICD-10-CM

## 2019-11-15 DIAGNOSIS — R1310 Dysphagia, unspecified: Secondary | ICD-10-CM | POA: Diagnosis not present

## 2019-11-15 DIAGNOSIS — Z0000 Encounter for general adult medical examination without abnormal findings: Secondary | ICD-10-CM | POA: Diagnosis not present

## 2019-11-15 DIAGNOSIS — E785 Hyperlipidemia, unspecified: Secondary | ICD-10-CM | POA: Diagnosis not present

## 2019-11-15 DIAGNOSIS — R69 Illness, unspecified: Secondary | ICD-10-CM | POA: Diagnosis not present

## 2019-11-15 DIAGNOSIS — E039 Hypothyroidism, unspecified: Secondary | ICD-10-CM | POA: Diagnosis not present

## 2019-11-15 LAB — CBC WITH DIFFERENTIAL/PLATELET
Basophils Absolute: 0 10*3/uL (ref 0.0–0.1)
Basophils Relative: 0.4 % (ref 0.0–3.0)
Eosinophils Absolute: 0.2 10*3/uL (ref 0.0–0.7)
Eosinophils Relative: 3.6 % (ref 0.0–5.0)
HCT: 44.7 % (ref 36.0–46.0)
Hemoglobin: 14.9 g/dL (ref 12.0–15.0)
Lymphocytes Relative: 27.8 % (ref 12.0–46.0)
Lymphs Abs: 1.7 10*3/uL (ref 0.7–4.0)
MCHC: 33.2 g/dL (ref 30.0–36.0)
MCV: 91.1 fl (ref 78.0–100.0)
Monocytes Absolute: 0.5 10*3/uL (ref 0.1–1.0)
Monocytes Relative: 8.5 % (ref 3.0–12.0)
Neutro Abs: 3.7 10*3/uL (ref 1.4–7.7)
Neutrophils Relative %: 59.7 % (ref 43.0–77.0)
Platelets: 179 10*3/uL (ref 150.0–400.0)
RBC: 4.91 Mil/uL (ref 3.87–5.11)
RDW: 13.9 % (ref 11.5–15.5)
WBC: 6.2 10*3/uL (ref 4.0–10.5)

## 2019-11-15 LAB — COMPREHENSIVE METABOLIC PANEL
ALT: 20 U/L (ref 0–35)
AST: 16 U/L (ref 0–37)
Albumin: 4.3 g/dL (ref 3.5–5.2)
Alkaline Phosphatase: 67 U/L (ref 39–117)
BUN: 24 mg/dL — ABNORMAL HIGH (ref 6–23)
CO2: 27 mEq/L (ref 19–32)
Calcium: 9.3 mg/dL (ref 8.4–10.5)
Chloride: 106 mEq/L (ref 96–112)
Creatinine, Ser: 0.7 mg/dL (ref 0.40–1.20)
GFR: 81.23 mL/min (ref >60.00)
Glucose, Bld: 111 mg/dL — ABNORMAL HIGH (ref 70–99)
Potassium: 4.3 mEq/L (ref 3.5–5.1)
Sodium: 140 mEq/L (ref 135–145)
Total Bilirubin: 0.5 mg/dL (ref 0.2–1.2)
Total Protein: 6.2 g/dL (ref 6.0–8.3)

## 2019-11-15 LAB — LIPID PANEL
Cholesterol: 202 mg/dL — ABNORMAL HIGH (ref 0–200)
HDL: 51.9 mg/dL (ref >39.00)
LDL Cholesterol: 128 mg/dL — ABNORMAL HIGH (ref 0–99)
NonHDL: 150
Total CHOL/HDL Ratio: 4
Triglycerides: 109 mg/dL (ref 0.0–149.0)
VLDL: 21.8 mg/dL (ref 0.0–40.0)

## 2019-11-15 LAB — TSH: TSH: 1.46 u[IU]/mL (ref 0.35–4.50)

## 2019-11-15 NOTE — Progress Notes (Signed)
Subjective:    [name], female    DOB: 1941-04-24, 78 y.o.   MRN: [id number]  HPI Patient presents for yearly preventative medicine examination. She is a pleasant 78 year old female who  has a past medical history of Anxiety, Arthritis, Breast cancer ([place]) (08/08/13), Cataract, Chronic insomnia, Cluster headaches, Depression, Fibromyalgia, GERD (gastroesophageal reflux disease), H/O hiatal hernia, History of colonic polyps, History of transfusion (08/30/2013), radiation therapy (10/29/13- 12/14/13), Hypothyroidism, Irritable bowel syndrome, Kidney stone, Lymphedema, Macular degeneration, Osteopenia, Other abnormal glucose, Other and unspecified hyperlipidemia, Pain in joint, shoulder region, Pneumonia [id number]), Sleep apnea, and Stress incontinence, female.  Hypothyroidism -controlled with Synthroid 100 mcg daily  GERD -controlled with Nexium 40 mg daily  Hyperlipidemia -takes Zocor 10 mg daily.  He denies myalgia or fatigue Lab Results  Component Value Date   CHOL 164 07/20/2018   HDL 46.50 07/20/2018   LDLCALC 96 07/20/2018   LDLDIRECT 113.0 05/17/2016   TRIG 105.0 07/20/2018   CHOLHDL 4 07/20/2018   Migraine Headaches -controlled with Topamax 25 mg. She will get migraine headaches with weather changes   Major Depressive Disorder -is followed by by behavioral health.  Currently prescribed Effexor 37.5 mg daily.  She denies feeling depressed at this time.  Her appetite is good.  Polyarthralgia -due to osteoarthritis.  Complains of pain in multiple joints.  This is been a chronic issue for her multiple years.  Been seen by rheumatology in the past and all autoimmune work-up has been negative.  She has been using Voltaren gel which helps somewhat for a short period of time and if the pain is uncontrollable then she takes tramadol.  Some  days she does not have any pain.  Does report worsening pain in bilateral hands which makes it difficult to knit as well as causes  decreased grip strength.  UTI -recently diagnosed with a UTI and prescribed Bactrim.  Her urine culture showed resistance to Bactrim and she was prescribed fosfomycin.  She reports that she continues to have frequent urination.  But denies dysuria, hematuria, fevers, chills, pelvic discomfort.  Dysphagia -lately she feels as though she is getting choked up not only when she eats or drinks but also when she has no PO intake  This often leads to coughing spells and feeling as though "things go down the wrong pipe".  She denies vomiting or choking   All immunizations and health maintenance protocols were reviewed with the patient and needed orders were placed. She is up to date on vaccinations   Appropriate screening laboratory values were ordered for the patient including screening of hyperlipidemia, renal function and hepatic function.  Medication reconciliation,  past medical history, social history, problem list and allergies were reviewed in detail with the patient  Goals were established with regard to weight loss, exercise, and  diet in compliance with medications. She does a lot of swimming during the warm months. Lately she has been more sedentary. She does eat healthy.     Review of Systems  Constitutional: Negative.   HENT: Negative.   Eyes: Negative.   Respiratory: Negative.   Cardiovascular: Negative.   Gastrointestinal: Negative.        Dysphagia    Endocrine: Negative.   Genitourinary: Positive for frequency. Negative for dysuria, flank pain, hematuria, pelvic pain and urgency.  Musculoskeletal: Positive for arthralgias.  Skin: Negative.   Allergic/Immunologic: Negative.   Neurological: Positive for dizziness (intermittent - chronic ).  Hematological: Negative.   Psychiatric/Behavioral: Negative.  Past Medical History:  Diagnosis Date  . Anxiety    PHOBIAS  . Arthritis   . Breast cancer ([place]) 08/08/13   right LOQ  . Cataract   . Chronic insomnia   . Cluster  headaches    history of migraines / NONE FOR SEVERAL YRS  . Depression   . Fibromyalgia   . GERD (gastroesophageal reflux disease)   . H/O hiatal hernia   . History of colonic polyps   . History of transfusion 08/30/2013  . Hx of radiation therapy 10/29/13- 12/14/13   right chest wall 5040 cGy 28 sessions, right supraclavicular/axillary region 5040 cGy 28 sessions, right chest wall boost 1000 cGy 5 sessions  . Hypothyroidism   . Irritable bowel syndrome   . Kidney stone   . Lymphedema    RT ARM - WEARS SLEEVE  . Macular degeneration    hole/right eye  . Osteopenia   . Other abnormal glucose   . Other and unspecified hyperlipidemia   . Pain in joint, shoulder region   . Pneumonia [id number]  . Sleep apnea    USES C-PAP  . Stress incontinence, female     Social History   Socioeconomic History  . Marital status: Married    Spouse name: Not on file  . Number of children: 3  . Years of education: Not on file  . Highest education level: Not on file  Occupational History  . Occupation: retired bookkeeper  Tobacco Use  . Smoking status: Former Smoker    Packs/day: 0.10    Years: 2.00    Pack years: 0.20    Types: Cigarettes    Start date: 11/16/1959    Quit date: 11/15/1960    Years since quitting: 59.0  . Smokeless tobacco: Never Used  Substance and Sexual Activity  . Alcohol use: No    Alcohol/week: 0.0 standard drinks  . Drug use: No  . Sexual activity: Not on file    Comment: menarche age 60, fist live birth 88, P 3, hysterectomy age 39, no HRT, BCP 2 yrs  Other Topics Concern  . Not on file  Social History Narrative   Occupation:  Retired Radiation protection practitioner    Married with 3 grown children      Never Smoked     Alcohol use-no        Social Determinants of Radio broadcast assistant Strain:   . Difficulty of Paying Living Expenses: Not on file  Food Insecurity:   . Worried About Charity fundraiser in the Last Year: Not on file  . Ran Out of Food in the Last Year:  Not on file  Transportation Needs:   . Lack of Transportation (Medical): Not on file  . Lack of Transportation (Non-Medical): Not on file  Physical Activity:   . Days of Exercise per Week: Not on file  . Minutes of Exercise per Session: Not on file  Stress:   . Feeling of Stress : Not on file  Social Connections:   . Frequency of Communication with Friends and Family: Not on file  . Frequency of Social Gatherings with Friends and Family: Not on file  . Attends Religious Services: Not on file  . Active Member of Clubs or Organizations: Not on file  . Attends Archivist Meetings: Not on file  . Marital Status: Not on file  Intimate Partner Violence:   . Fear of Current or Ex-Partner: Not on file  . Emotionally Abused: Not on file  .  Physically Abused: Not on file  . Sexually Abused: Not on file    Past Surgical History:  Procedure Laterality Date  . ABDOMINAL HYSTERECTOMY    . APPENDECTOMY    . BILATERAL TOTAL MASTECTOMY WITH AXILLARY LYMPH NODE DISSECTION  08/30/2013   Dr Barry Dienes  . BREAST CYST ASPIRATION     9 cysts  . CATARACT EXTRACTION, BILATERAL  2005/2007  . CHOLECYSTECTOMY    . COLONOSCOPY    . EVACUATION BREAST HEMATOMA Left 08/31/2013   Procedure: EVACUATION HEMATOMA BREAST;  Surgeon: Stark Klein, MD;  Location: [facility];  Service: General;  Laterality: Left;  . EYE SURGERY     to repair macular hole  . FOOT ARTHROPLASTY     lt   . GANGLION CYST EXCISION     rt foot  . HEMORRHOID SURGERY     03/1993  . JOINT REPLACEMENT  03/15/11   left knee replacement  . KNEE ARTHROSCOPY     /partial knee 2016/left knee 2012  . MASS EXCISION  11/04/2011   Procedure: EXCISION MASS;  Surgeon: Cammie Sickle., MD;  Location: [facility];  Service: Orthopedics;  Laterality: Right;  excisional biopsy right ulna mass  . MASTECTOMY W/ SENTINEL NODE BIOPSY Right 08/30/2013   Procedure: RIGHT  AXILLARY SENTINEL LYMPH NODE BIOPSY; Right Axillary Node  Disection;  Surgeon: Stark Klein, MD;  Location: [facility];  Service: General;  Laterality: Right;  Right side nuc med 7:00   . PARTIAL KNEE ARTHROPLASTY Right 11/03/2015   Procedure: RIGHT KNEE MEDIAL UNICOMPARTMENTAL ARTHROPLASTY ;  Surgeon: Gaynelle Arabian, MD;  Location: [facility];  Service: Orthopedics;  Laterality: Right;  . SIMPLE MASTECTOMY WITH AXILLARY SENTINEL NODE BIOPSY Left 08/30/2013   Procedure: Bilateral Breast Mastectomy ;  Surgeon: Stark Klein, MD;  Location: [facility];  Service: General;  Laterality: Left;  . skin tags removed     breast, panty line, neckline  . TOE SURGERY     preventative crossover toe surg/right foot  . TOE SURGERY  2009   left foot/screw  in 2nd toe  . TONSILLECTOMY    . UPPER GASTROINTESTINAL ENDOSCOPY      Family History  Problem Relation Age of Onset  . Stroke Mother        died age 68  . Diabetes Mother   . Breast cancer Mother 25  . Breast cancer Sister 56  . Breast cancer Paternal Aunt 61  . Diabetes Maternal Grandfather   . Breast cancer Paternal Grandmother 39  . Breast cancer Paternal Aunt        dx in her 41s  . Cancer Maternal Grandmother        intra-abdominal cancer  . Brain cancer Maternal Uncle 8  . Brain cancer Cousin 68       maternal cousin  . Brain cancer Cousin 20       paternal cousin  . Colon cancer Neg Hx     No Known Allergies  Current Outpatient Medications on File Prior to Visit  Medication Sig Dispense Refill  . Biotin 1000 MCG CHEW Chew by mouth.    . butalbital-acetaminophen-caffeine (FIORICET) 50-325-40 MG tablet Take 1-2 tablets by mouth every 6 (six) hours as needed for headache. 20 tablet 1  . Calcium Carb-Cholecalciferol (CALCIUM-VITAMIN D) 500-400 MG-UNIT TABS Take by mouth 2 (two) times daily.     . cholecalciferol (VITAMIN D) 1000 units tablet Take 2,000 Units by mouth daily.    Marland Kitchen co-enzyme Q-10 30 MG capsule  Take 300 mg by mouth daily.    . diazepam (VALIUM) 5 MG tablet TAKE 1 TO 2 TABLETS BY MOUTH AT  BEDTIME. TAKE 1 OR 2 PILLS 1 HOUR PRIOR TO DENTAL APPOINTMENT    . diclofenac sodium (VOLTAREN) 1 % GEL as needed.     Marland Kitchen esomeprazole (NEXIUM) 40 MG capsule Take 1 capsule by mouth  daily 90 capsule 3  . levothyroxine (SYNTHROID) 100 MCG tablet TAKE 1 TABLET BY MOUTH ONCE DAILY BEFORE BREAKFAST AND 1 TABLET IN THE MIDDLE OF THE NIGHT 90 tablet 0  . LUTEIN PO Take by mouth.    . meloxicam (MOBIC) 7.5 MG tablet Take 1 tablet by mouth twice daily as needed for pain 180 tablet 0  . methocarbamol (ROBAXIN) 500 MG tablet Take 500 mg by mouth as needed for muscle spasms.    Marland Kitchen omega-3 acid ethyl esters (LOVAZA) 1 g capsule Take 1 capsule by mouth twice daily 180 capsule 0  . ondansetron (ZOFRAN) 4 MG tablet Take 1 tablet (4 mg total) by mouth every 8 (eight) hours as needed for nausea or vomiting. 20 tablet 0  . oxyCODONE-acetaminophen (PERCOCET) 5-325 MG tablet Take by mouth as needed for severe pain.    . polyethylene glycol (MIRALAX / GLYCOLAX) packet Take 17 g by mouth daily.    . pregabalin (LYRICA) 75 MG capsule Take 1 capsule by mouth twice daily 180 capsule 0  . saccharomyces boulardii (FLORASTOR) 250 MG capsule Take 250 mg by mouth daily.    . simvastatin (ZOCOR) 10 MG tablet TAKE 1 TABLET BY MOUTH AT BEDTIME 90 tablet 0  . temazepam (RESTORIL) 15 MG capsule Take 15 mg by mouth at bedtime as needed for sleep.    Marland Kitchen topiramate (TOPAMAX) 25 MG tablet Take 1 tablet (25 mg total) by mouth daily. 90 tablet 3  . traMADol (ULTRAM) 50 MG tablet Take 1 tablet (50 mg total) by mouth every 12 (twelve) hours as needed for moderate pain. 30 tablet 2  . TURMERIC PO Take 1,000 mg by mouth daily.    Marland Kitchen venlafaxine XR (EFFEXOR-XR) 37.5 MG 24 hr capsule Take 1 capsule (37.5 mg total) by mouth daily with breakfast. 90 capsule 1   No current facility-administered medications on file prior to visit.    There were no vitals taken for this visit.      Objective:   Physical Exam Vitals and nursing note reviewed.   Constitutional:      Appearance: Normal appearance. She is overweight.  HENT:     Head: Normocephalic and atraumatic.     Right Ear: Tympanic membrane, ear canal and external ear normal. There is no impacted cerumen.     Left Ear: Tympanic membrane, ear canal and external ear normal. There is no impacted cerumen.     Nose: Nose normal. No congestion or rhinorrhea.     Mouth/Throat:     Mouth: Mucous membranes are moist.     Pharynx: Oropharynx is clear. No oropharyngeal exudate or posterior oropharyngeal erythema.  Eyes:     Extraocular Movements: Extraocular movements intact.     Conjunctiva/sclera: Conjunctivae normal.     Pupils: Pupils are equal, round, and reactive to light.  Neck:     Vascular: No carotid bruit.  Cardiovascular:     Rate and Rhythm: Normal rate and regular rhythm.     Pulses: Normal pulses.     Heart sounds: Normal heart sounds. No murmur. No friction rub. No gallop.   Pulmonary:  Effort: Pulmonary effort is normal. No respiratory distress.     Breath sounds: Normal breath sounds. No stridor. No wheezing, rhonchi or rales.  Chest:     Chest wall: No tenderness.  Abdominal:     General: Abdomen is flat. Bowel sounds are normal. There is no distension.     Palpations: Abdomen is soft. There is no mass.     Tenderness: There is no abdominal tenderness. There is no right CVA tenderness, left CVA tenderness, guarding or rebound.     Hernia: No hernia is present.  Musculoskeletal:        General: No swelling, tenderness, deformity or signs of injury. Normal range of motion.     Cervical back: Neck supple.     Right lower leg: No edema.     Left lower leg: No edema.  Skin:    General: Skin is warm and dry.     Capillary Refill: Capillary refill takes less than 2 seconds.     Coloration: Skin is not jaundiced or pale.     Findings: No bruising, erythema, lesion or rash.  Neurological:     General: No focal deficit present.     Mental Status: She is alert  and oriented to person, place, and time. Mental status is at baseline.     Cranial Nerves: No cranial nerve deficit.     Sensory: No sensory deficit.     Motor: Weakness present.     Coordination: Coordination normal.     Gait: Gait normal.     Deep Tendon Reflexes: Reflexes normal.     Comments: 3/5 grip strength bilaterally   Psychiatric:        Mood and Affect: Mood normal.        Behavior: Behavior normal.        Thought Content: Thought content normal.        Judgment: Judgment normal.       Assessment & Plan:  1. Routine general medical examination at a health care facility  - Culture, Urine - POC Urinalysis Dipstick - CBC with Differential/Platelet - CMP - Lipid panel - TSH  2. History of UTI  - Culture, Urine - POC Urinalysis Dipstick  3. Urine frequency  - Culture, Urine - POC Urinalysis Dipstick  4. Hx of migraine headaches - Continue with Topamax - CBC with Differential/Platelet - CMP - Lipid panel - TSH  5. Polyarthralgia - Continue with NSAIDS and Tramadol PRN  - Follow up with Rheumatology as needed - CBC with Differential/Platelet - CMP - Lipid panel - TSH  6. Reactive depression (situational) - Continue with Effexor - Follow up with [facility] as directed  7. Gastroesophageal reflux disease without esophagitis - Continue with Nexium   8. Hypothyroidism, unspecified type - Consider increase in synthroid  - CBC with Differential/Platelet - CMP - Lipid panel - TSH  9. Hyperlipidemia, unspecified hyperlipidemia type - Consider increase in statin  - CBC with Differential/Platelet - CMP - Lipid panel - TSH  10. Dysphagia, unspecified type - Ambulatory referral to Gastroenterology   [name], NP

## 2019-11-17 LAB — URINE CULTURE
MICRO NUMBER:: 1245241
SPECIMEN QUALITY:: ADEQUATE

## 2019-11-20 ENCOUNTER — Other Ambulatory Visit: Payer: Self-pay | Admitting: Adult Health

## 2019-11-20 MED ORDER — NITROFURANTOIN MONOHYD MACRO 100 MG PO CAPS: 100.0000 mg | ORAL_CAPSULE | Freq: Two times a day (BID) | ORAL | 0 refills | Status: AC

## 2019-11-27 ENCOUNTER — Other Ambulatory Visit: Payer: Self-pay | Admitting: Adult Health

## 2019-11-27 ENCOUNTER — Ambulatory Visit (HOSPITAL_COMMUNITY): Payer: Medicare HMO | Admitting: Psychiatry

## 2019-11-27 DIAGNOSIS — Z87440 Personal history of urinary (tract) infections: Secondary | ICD-10-CM

## 2019-11-28 ENCOUNTER — Encounter: Payer: Self-pay | Admitting: Family Medicine

## 2019-11-28 ENCOUNTER — Other Ambulatory Visit: Payer: Self-pay

## 2019-11-28 ENCOUNTER — Ambulatory Visit (INDEPENDENT_AMBULATORY_CARE_PROVIDER_SITE_OTHER): Payer: Medicare HMO | Admitting: Family Medicine

## 2019-11-28 VITALS — BP 128/84 | HR 56 | Temp 97.4°F | Wt 166.0 lb

## 2019-11-28 DIAGNOSIS — H6591 Unspecified nonsuppurative otitis media, right ear: Secondary | ICD-10-CM | POA: Diagnosis not present

## 2019-11-28 NOTE — Patient Instructions (Signed)

## 2019-11-28 NOTE — Progress Notes (Signed)
Subjective:     [name], female   DOB: 27-Mar-1941, 79 y.o.   MRN: [id number]  HPI  Patient is seen for sudden hearing loss right ear which she first noted last night.  No ear pain.  No drainage.  She does have some mild dizziness.  No fevers or chills.  She had some recent mild nasal congestive symptoms.  Denies any history of otitis media in the past.  She states her hearing was very diminished yesterday but actually somewhat improved today.  Past Medical History:  Diagnosis Date  . Anxiety    PHOBIAS  . Arthritis   . Breast cancer ([place]) 08/08/13   right LOQ  . Cataract   . Chronic insomnia   . Cluster headaches    history of migraines / NONE FOR SEVERAL YRS  . Depression   . Fibromyalgia   . GERD (gastroesophageal reflux disease)   . H/O hiatal hernia   . History of colonic polyps   . History of transfusion 08/30/2013  . Hx of radiation therapy 10/29/13- 12/14/13   right chest wall 5040 cGy 28 sessions, right supraclavicular/axillary region 5040 cGy 28 sessions, right chest wall boost 1000 cGy 5 sessions  . Hypothyroidism   . Irritable bowel syndrome   . Kidney stone   . Lymphedema    RT ARM - WEARS SLEEVE  . Macular degeneration    hole/right eye  . Osteopenia   . Other abnormal glucose   . Other and unspecified hyperlipidemia   . Pain in joint, shoulder region   . Pneumonia [id number]  . Sleep apnea    USES C-PAP  . Stress incontinence, female    Past Surgical History:  Procedure Laterality Date  . ABDOMINAL HYSTERECTOMY    . APPENDECTOMY    . BILATERAL TOTAL MASTECTOMY WITH AXILLARY LYMPH NODE DISSECTION  08/30/2013   Dr Barry Dienes  . BREAST CYST ASPIRATION     9 cysts  . CATARACT EXTRACTION, BILATERAL  2005/2007  . CHOLECYSTECTOMY    . COLONOSCOPY    . EVACUATION BREAST HEMATOMA Left 08/31/2013   Procedure: EVACUATION HEMATOMA BREAST;  Surgeon: Stark Klein, MD;  Location: [facility];  Service: General;  Laterality: Left;  . EYE SURGERY     to  repair macular hole  . FOOT ARTHROPLASTY     lt   . GANGLION CYST EXCISION     rt foot  . HEMORRHOID SURGERY     03/1993  . JOINT REPLACEMENT  03/15/11   left knee replacement  . KNEE ARTHROSCOPY     /partial knee 2016/left knee 2012  . MASS EXCISION  11/04/2011   Procedure: EXCISION MASS;  Surgeon: Cammie Sickle., MD;  Location: [facility];  Service: Orthopedics;  Laterality: Right;  excisional biopsy right ulna mass  . MASTECTOMY W/ SENTINEL NODE BIOPSY Right 08/30/2013   Procedure: RIGHT  AXILLARY SENTINEL LYMPH NODE BIOPSY; Right Axillary Node Disection;  Surgeon: Stark Klein, MD;  Location: [facility];  Service: General;  Laterality: Right;  Right side nuc med 7:00   . PARTIAL KNEE ARTHROPLASTY Right 11/03/2015   Procedure: RIGHT KNEE MEDIAL UNICOMPARTMENTAL ARTHROPLASTY ;  Surgeon: Gaynelle Arabian, MD;  Location: [facility];  Service: Orthopedics;  Laterality: Right;  . SIMPLE MASTECTOMY WITH AXILLARY SENTINEL NODE BIOPSY Left 08/30/2013   Procedure: Bilateral Breast Mastectomy ;  Surgeon: Stark Klein, MD;  Location: [facility];  Service: General;  Laterality: Left;  . skin tags removed  breast, panty line, neckline  . TOE SURGERY     preventative crossover toe surg/right foot  . TOE SURGERY  2009   left foot/screw  in 2nd toe  . TONSILLECTOMY    . UPPER GASTROINTESTINAL ENDOSCOPY      reports that she quit smoking about 59 years ago. Her smoking use included cigarettes. She started smoking about 60 years ago. She has a 0.20 pack-year smoking history. She has never used smokeless tobacco. She reports that she does not drink alcohol or use drugs. family history includes Brain cancer (age of onset: 72) in her cousin; Brain cancer (age of onset: 58) in her cousin; Brain cancer (age of onset: 65) in her maternal uncle; Breast cancer in her paternal aunt; Breast cancer (age of onset: 42) in her paternal aunt and paternal grandmother; Breast cancer (age of onset: 66) in her  sister; Breast cancer (age of onset: 33) in her mother; Cancer in her maternal grandmother; Diabetes in her maternal grandfather and mother; Stroke in her mother. No Known Allergies  Review of Systems  Constitutional: Negative for chills and fever.  HENT: Positive for hearing loss. Negative for ear discharge and ear pain.   Respiratory: Negative for cough and shortness of breath.   Cardiovascular: Negative for chest pain.  Neurological: Positive for dizziness. Negative for headaches.       Objective:   Physical Exam Vitals reviewed.  Constitutional:      Appearance: Normal appearance.  HENT:     Ears:     Comments: Left eardrum and canal are normal.  Right canal normal.  Right eardrum reveals large vesicle along the inferior posterior aspect with evidence for serous effusion.  No significant erythema.  No perforation and no fluid in canal.  Cardiovascular:     Rate and Rhythm: Normal rate and regular rhythm.  Pulmonary:     Effort: Pulmonary effort is normal.     Breath sounds: Normal breath sounds.  Neurological:     General: No focal deficit present.     Mental Status: She is alert and oriented to person, place, and time.     Cranial Nerves: No cranial nerve deficit.        Assessment:     Sudden right-sided hearing loss related to large serous effusion on the right.  No suppurative changes at this time    Plan:     -We explained this may take some time to resolve.  Would not recommend ENT referral at this point but if symptoms persist over several weeks would consider -Recommend over-the-counter antihistamine and/or nasal steroid for any allergy nasal congestive symptoms -Discussed the fact that she may be more prone to dizziness and vertigo symptoms with the effusion and she will change positions slowly and consider additional point of contact such as cane for balance until this is resolved  Eulas Post MD [facility] at [facility]

## 2019-12-03 ENCOUNTER — Other Ambulatory Visit: Payer: Self-pay

## 2019-12-03 ENCOUNTER — Other Ambulatory Visit: Payer: Medicare HMO

## 2019-12-03 DIAGNOSIS — Z87440 Personal history of urinary (tract) infections: Secondary | ICD-10-CM

## 2019-12-05 ENCOUNTER — Telehealth: Payer: Self-pay | Admitting: Adult Health

## 2019-12-05 DIAGNOSIS — N3281 Overactive bladder: Secondary | ICD-10-CM

## 2019-12-05 LAB — URINE CULTURE
MICRO NUMBER:: 10052505
SPECIMEN QUALITY:: ADEQUATE

## 2019-12-05 NOTE — Telephone Encounter (Signed)
Spoke to [employer] with infectious disease as patient's urine culture came back positive again for E. coli bacteria.  She is resistant to multiple antibiotics, was trialed recently on Macrobid.  Advised that since patient is only having symptoms of urinary frequency to look at other causes of her urinary frequency.  Likely she is colonized and needs no further treatment.  Spoke to patient advised her that she was treated adequately and that likely she is colonized with bacteria.  She reports that she continues to have urinary frequency but no other symptoms currently.  We talked about starting medication for OAB, seeing a urologist, or doing pelvic floor physical therapy to see if that helps.  Right now she is okay with doing physical therapy, would not like to start another medication at this point in time

## 2019-12-06 ENCOUNTER — Encounter: Payer: Self-pay | Admitting: Adult Health

## 2019-12-06 ENCOUNTER — Other Ambulatory Visit: Payer: Self-pay | Admitting: Adult Health

## 2019-12-06 DIAGNOSIS — Z760 Encounter for issue of repeat prescription: Secondary | ICD-10-CM

## 2019-12-06 MED ORDER — PREGABALIN 75 MG PO CAPS: 75.0000 mg | ORAL_CAPSULE | Freq: Two times a day (BID) | ORAL | 0 refills | Status: DC

## 2019-12-06 NOTE — Telephone Encounter (Signed)
Sent in [place] electronically

## 2019-12-06 NOTE — Telephone Encounter (Signed)
Please advise 

## 2019-12-06 NOTE — Telephone Encounter (Signed)
Please advise. Lyrica printed by mistake.  Last filled 06/27/2019 Last OV 11/28/2019  Ok to fill?

## 2019-12-06 NOTE — Addendum Note (Signed)
Addended by: Apolinar Junes on: 12/06/2019 01:55 PM   Modules accepted: Orders

## 2019-12-07 MED ORDER — ESOMEPRAZOLE MAGNESIUM 40 MG PO CPDR: DELAYED_RELEASE_CAPSULE | ORAL | 3 refills | Status: DC

## 2019-12-07 NOTE — Telephone Encounter (Signed)
Rx called in pt notified of update.  ?

## 2019-12-10 ENCOUNTER — Ambulatory Visit (INDEPENDENT_AMBULATORY_CARE_PROVIDER_SITE_OTHER): Payer: Medicare HMO | Admitting: Psychiatry

## 2019-12-10 ENCOUNTER — Encounter: Payer: Self-pay | Admitting: Psychiatry

## 2019-12-10 ENCOUNTER — Other Ambulatory Visit: Payer: Self-pay

## 2019-12-10 DIAGNOSIS — R69 Illness, unspecified: Secondary | ICD-10-CM | POA: Diagnosis not present

## 2019-12-10 DIAGNOSIS — F3342 Major depressive disorder, recurrent, in full remission: Secondary | ICD-10-CM | POA: Insufficient documentation

## 2019-12-10 MED ORDER — VENLAFAXINE HCL ER 37.5 MG PO CP24: 37.5000 mg | ORAL_CAPSULE | Freq: Every day | ORAL | 1 refills | Status: DC

## 2019-12-10 NOTE — Progress Notes (Signed)
[facility] MD OP Progress Note  I connected with  [name] on 12/10/19 by a video enabled telemedicine application and verified that I am speaking with the correct person using two identifiers.   I discussed the limitations of evaluation and management by telemedicine. The patient expressed understanding and agreed to proceed.    12/10/2019 2:16 PM Jean Nash  MRN:  [id number]  Chief Complaint: " I am doing wonderful."  HPI: Patient reported doing well on Effexor 37.5 mg daily.  She stated her mood has been very good.  She stated that staying indoors due to the pandemic has actually been a blessing.  She prefers staying indoors and being a homebody.  She reported she has never been a heavy sleeper and continues to have early morning awakening which is not bothersome to her.  She denied any acute issues or concerns at this time.  Visit Diagnosis:    ICD-10-CM   1. MDD (major depressive disorder), recurrent, in full remission ([facility])  F33.42 venlafaxine XR (EFFEXOR-XR) 37.5 MG 24 hr capsule    Past Psychiatric History: MDD  Past Medical History:  Past Medical History:  Diagnosis Date  . Anxiety    PHOBIAS  . Arthritis   . Breast cancer ([facility]) 08/08/13   right LOQ  . Cataract   . Chronic insomnia   . Cluster headaches    history of migraines / NONE FOR SEVERAL YRS  . Depression   . Fibromyalgia   . GERD (gastroesophageal reflux disease)   . H/O hiatal hernia   . History of colonic polyps   . History of transfusion 08/30/2013  . Hx of radiation therapy 10/29/13- 12/14/13   right chest wall 5040 cGy 28 sessions, right supraclavicular/axillary region 5040 cGy 28 sessions, right chest wall boost 1000 cGy 5 sessions  . Hypothyroidism   . Irritable bowel syndrome   . Kidney stone   . Lymphedema    RT ARM - WEARS SLEEVE  . Macular degeneration    hole/right eye  . Osteopenia   . Other abnormal glucose   . Other and unspecified hyperlipidemia   . Pain in joint, shoulder region    . Pneumonia [id number]  . Sleep apnea    USES C-PAP  . Stress incontinence, female     Past Surgical History:  Procedure Laterality Date  . ABDOMINAL HYSTERECTOMY    . APPENDECTOMY    . BILATERAL TOTAL MASTECTOMY WITH AXILLARY LYMPH NODE DISSECTION  08/30/2013   Dr Barry Dienes  . BREAST CYST ASPIRATION     9 cysts  . CATARACT EXTRACTION, BILATERAL  2005/2007  . CHOLECYSTECTOMY    . COLONOSCOPY    . EVACUATION BREAST HEMATOMA Left 08/31/2013   Procedure: EVACUATION HEMATOMA BREAST;  Surgeon: Stark Klein, MD;  Location: [facility];  Service: General;  Laterality: Left;  . EYE SURGERY     to repair macular hole  . FOOT ARTHROPLASTY     lt   . GANGLION CYST EXCISION     rt foot  . HEMORRHOID SURGERY     03/1993  . JOINT REPLACEMENT  03/15/11   left knee replacement  . KNEE ARTHROSCOPY     /partial knee 2016/left knee 2012  . MASS EXCISION  11/04/2011   Procedure: EXCISION MASS;  Surgeon: Cammie Sickle., MD;  Location: [facility];  Service: Orthopedics;  Laterality: Right;  excisional biopsy right ulna mass  . MASTECTOMY W/ SENTINEL NODE BIOPSY Right 08/30/2013   Procedure: RIGHT  AXILLARY SENTINEL  LYMPH NODE BIOPSY; Right Axillary Node Disection;  Surgeon: Stark Klein, MD;  Location: [facility];  Service: General;  Laterality: Right;  Right side nuc med 7:00   . PARTIAL KNEE ARTHROPLASTY Right 11/03/2015   Procedure: RIGHT KNEE MEDIAL UNICOMPARTMENTAL ARTHROPLASTY ;  Surgeon: Gaynelle Arabian, MD;  [facility];  Service: Orthopedics;  Laterality: Right;  . SIMPLE MASTECTOMY WITH AXILLARY SENTINEL NODE BIOPSY Left 08/30/2013   Procedure: Bilateral Breast Mastectomy ;  Surgeon: Stark Klein, MD;  Location: [facility];  Service: General;  Laterality: Left;  . skin tags removed     breast, panty line, neckline  . TOE SURGERY     preventative crossover toe surg/right foot  . TOE SURGERY  2009   left foot/screw  in 2nd toe  . TONSILLECTOMY    . UPPER GASTROINTESTINAL ENDOSCOPY       Family Psychiatric History: denied  Family History:  Family History  Problem Relation Age of Onset  . Stroke Mother        died age 71  . Diabetes Mother   . Breast cancer Mother 74  . Breast cancer Sister 46  . Breast cancer Paternal Aunt 3  . Diabetes Maternal Grandfather   . Breast cancer Paternal Grandmother 33  . Breast cancer Paternal Aunt        dx in her 70s  . Cancer Maternal Grandmother        intra-abdominal cancer  . Brain cancer Maternal Uncle 8  . Brain cancer Cousin 34       maternal cousin  . Brain cancer Cousin 20       paternal cousin  . Colon cancer Neg Hx     Social History:  Social History   Socioeconomic History  . Marital status: Married    Spouse name: Not on file  . Number of children: 3  . Years of education: Not on file  . Highest education level: Not on file  Occupational History  . Occupation: retired bookkeeper  Tobacco Use  . Smoking status: Former Smoker    Packs/day: 0.10    Years: 2.00    Pack years: 0.20    Types: Cigarettes    Start date: 11/16/1959    Quit date: 11/15/1960    Years since quitting: 59.1  . Smokeless tobacco: Never Used  Substance and Sexual Activity  . Alcohol use: No    Alcohol/week: 0.0 standard drinks  . Drug use: No  . Sexual activity: Not on file    Comment: menarche age 84, fist live birth 24, P 3, hysterectomy age 89, no HRT, BCP 2 yrs  Other Topics Concern  . Not on file  Social History Narrative   Occupation:  Retired Radiation protection practitioner    Married with 3 grown children      Never Smoked     Alcohol use-no        Social Determinants of Radio broadcast assistant Strain:   . Difficulty of Paying Living Expenses: Not on file  Food Insecurity:   . Worried About Charity fundraiser in the Last Year: Not on file  . Ran Out of Food in the Last Year: Not on file  Transportation Needs:   . Lack of Transportation (Medical): Not on file  . Lack of Transportation (Non-Medical): Not on file  Physical  Activity:   . Days of Exercise per Week: Not on file  . Minutes of Exercise per Session: Not on file  Stress:   .  Feeling of Stress : Not on file  Social Connections:   . Frequency of Communication with Friends and Family: Not on file  . Frequency of Social Gatherings with Friends and Family: Not on file  . Attends Religious Services: Not on file  . Active Member of Clubs or Organizations: Not on file  . Attends Archivist Meetings: Not on file  . Marital Status: Not on file    Allergies: No Known Allergies  Metabolic Disorder Labs: Lab Results  Component Value Date   HGBA1C 5.4 10/05/2019   MPG 126 (H) 08/27/2013   MPG 117 (H) 06/22/2013   Lab Results  Component Value Date   PROLACTIN 3.9 07/22/2009   Lab Results  Component Value Date   CHOL 202 (H) 11/15/2019   TRIG 109.0 11/15/2019   HDL 51.90 11/15/2019   CHOLHDL 4 11/15/2019   VLDL 21.8 11/15/2019   LDLCALC 128 (H) 11/15/2019   LDLCALC 96 07/20/2018   Lab Results  Component Value Date   TSH 1.46 11/15/2019   TSH 0.84 07/20/2018    Therapeutic Level Labs: No results found for: LITHIUM No results found for: VALPROATE No components found for:  CBMZ  Current Medications: Current Outpatient Medications  Medication Sig Dispense Refill  . Biotin 1000 MCG CHEW Chew by mouth.    . butalbital-acetaminophen-caffeine (FIORICET) 50-325-40 MG tablet Take 1-2 tablets by mouth every 6 (six) hours as needed for headache. 20 tablet 1  . Calcium Carb-Cholecalciferol (CALCIUM-VITAMIN D) 500-400 MG-UNIT TABS Take by mouth 2 (two) times daily.     . cholecalciferol (VITAMIN D) 1000 units tablet Take 2,000 Units by mouth daily.    Marland Kitchen co-enzyme Q-10 30 MG capsule Take 300 mg by mouth daily.    . diazepam (VALIUM) 5 MG tablet TAKE 1 TO 2 TABLETS BY MOUTH AT BEDTIME. TAKE 1 OR 2 PILLS 1 HOUR PRIOR TO DENTAL APPOINTMENT    . diclofenac sodium (VOLTAREN) 1 % GEL as needed.     Marland Kitchen esomeprazole (NEXIUM) 40 MG capsule Take 1  capsule by mouth  daily 90 capsule 3  . levothyroxine (SYNTHROID) 100 MCG tablet TAKE 1 TABLET BY MOUTH ONCE DAILY BEFORE BREAKFAST AND 1 TABLET IN THE MIDDLE OF THE NIGHT 90 tablet 0  . LUTEIN PO Take by mouth.    . meloxicam (MOBIC) 7.5 MG tablet Take 1 tablet by mouth twice daily as needed for pain 180 tablet 0  . methocarbamol (ROBAXIN) 500 MG tablet Take 500 mg by mouth as needed for muscle spasms.    Marland Kitchen omega-3 acid ethyl esters (LOVAZA) 1 g capsule Take 1 capsule by mouth twice daily 180 capsule 0  . oxyCODONE-acetaminophen (PERCOCET) 5-325 MG tablet Take by mouth as needed for severe pain.    . polyethylene glycol (MIRALAX / GLYCOLAX) packet Take 17 g by mouth daily.    . pregabalin (LYRICA) 75 MG capsule Take 1 capsule (75 mg total) by mouth 2 (two) times daily. 180 capsule 0  . saccharomyces boulardii (FLORASTOR) 250 MG capsule Take 250 mg by mouth daily.    . simvastatin (ZOCOR) 10 MG tablet TAKE 1 TABLET BY MOUTH AT BEDTIME 90 tablet 0  . topiramate (TOPAMAX) 25 MG tablet Take 1 tablet (25 mg total) by mouth daily. 90 tablet 3  . traMADol (ULTRAM) 50 MG tablet Take 1 tablet (50 mg total) by mouth every 12 (twelve) hours as needed for moderate pain. 30 tablet 2  . TURMERIC PO Take 1,000 mg by mouth daily.    Marland Kitchen  venlafaxine XR (EFFEXOR-XR) 37.5 MG 24 hr capsule Take 1 capsule (37.5 mg total) by mouth daily with breakfast. 90 capsule 1   No current facility-administered medications for this visit.     Psychiatric Specialty Exam: Review of Systems  There were no vitals taken for this visit.There is no height or weight on file to calculate BMI.  General Appearance: Well Groomed  Eye Contact:  Good  Speech:  Clear and Coherent and Normal Rate  Volume:  Normal  Mood:  Euthymic  Affect:  Congruent  Thought Process:  Goal Directed, Linear and Descriptions of Associations: Intact  Orientation:  Full (Time, Place, and Person)  Thought Content: Logical   Suicidal Thoughts:  No   Homicidal Thoughts:  No  Memory:  Recent;   Good Remote;   Good  Judgement:  Good  Insight:  Good  Psychomotor Activity:  Normal  Concentration:  Concentration: Good and Attention Span: Good  Recall:  Good  Fund of Knowledge: Good  Language: Good  Akathisia:  Negative  Handed:  Right  AIMS (if indicated): not done  Assets:  Communication Skills Desire for Improvement Financial Resources/Insurance Housing Social Support  ADL's:  Intact  Cognition: WNL  Sleep:  Fair   Screenings: PHQ2-9     Office Visit from 07/20/2018 in [facility] at [facility] from 07/14/2017 in [facility] at [facility] from 11/28/2015 in [facility] at [facility] from 10/14/2014 in [facility] at [facility] from 10/01/2013 in [facility] at [facility] Total Score  0  0  0  1  0  PHQ-9 Total Score  --  5  --  --  --       Assessment and Plan: Patient reported she is doing well on current regimen.  1. MDD (major depressive disorder), recurrent, in full remission ([place])  - venlafaxine XR (EFFEXOR-XR) 37.5 MG 24 hr capsule; Take 1 capsule (37.5 mg total) by mouth daily with breakfast.  Dispense: 90 capsule; Refill: 1  Continue same medication regimen. Follow up in 3 months.   [place] Crane, MD 12/10/2019, 2:16 PM

## 2019-12-11 ENCOUNTER — Encounter: Payer: Self-pay | Admitting: Adult Health

## 2019-12-14 ENCOUNTER — Ambulatory Visit (INDEPENDENT_AMBULATORY_CARE_PROVIDER_SITE_OTHER): Payer: Medicare HMO | Admitting: Adult Health

## 2019-12-14 ENCOUNTER — Encounter: Payer: Self-pay | Admitting: Adult Health

## 2019-12-14 ENCOUNTER — Other Ambulatory Visit: Payer: Self-pay

## 2019-12-14 VITALS — BP 120/80 | HR 65 | Temp 97.9°F | Ht 59.0 in | Wt 170.0 lb

## 2019-12-14 DIAGNOSIS — L821 Other seborrheic keratosis: Secondary | ICD-10-CM | POA: Diagnosis not present

## 2019-12-14 NOTE — Progress Notes (Signed)
Subjective:    [name], female    DOB: Sep 13, 1941, 79 y.o.   MRN: [id number]  HPI  79 year old female who  has a past medical history of Anxiety, Arthritis, Breast cancer ([place]) (08/08/13), Cataract, Chronic insomnia, Cluster headaches, Depression, Fibromyalgia, GERD (gastroesophageal reflux disease), H/O hiatal hernia, History of colonic polyps, History of transfusion (08/30/2013), radiation therapy (10/29/13- 12/14/13), Hypothyroidism, Irritable bowel syndrome, Kidney stone, Lymphedema, Macular degeneration, Osteopenia, Other abnormal glucose, Other and unspecified hyperlipidemia, Pain in joint, shoulder region, Pneumonia [id number]), Sleep apnea, and Stress incontinence, female.  She presents to the office today for concern of skin neoplasm on her left flank. She reports history of keratosis. Unable to see neoplasm but husband reports that it has been growing large, has a rough texture and he believes it is becoming darker.   Patient reports irritation from clothing   Review of Systems See HPI   Past Medical History:  Diagnosis Date  . Anxiety    PHOBIAS  . Arthritis   . Breast cancer ([place]) 08/08/13   right LOQ  . Cataract   . Chronic insomnia   . Cluster headaches    history of migraines / NONE FOR SEVERAL YRS  . Depression   . Fibromyalgia   . GERD (gastroesophageal reflux disease)   . H/O hiatal hernia   . History of colonic polyps   . History of transfusion 08/30/2013  . Hx of radiation therapy 10/29/13- 12/14/13   right chest wall 5040 cGy 28 sessions, right supraclavicular/axillary region 5040 cGy 28 sessions, right chest wall boost 1000 cGy 5 sessions  . Hypothyroidism   . Irritable bowel syndrome   . Kidney stone   . Lymphedema    RT ARM - WEARS SLEEVE  . Macular degeneration    hole/right eye  . Osteopenia   . Other abnormal glucose   . Other and unspecified hyperlipidemia   . Pain in joint, shoulder region   . Pneumonia [id number]  . Sleep  apnea    USES C-PAP  . Stress incontinence, female     Social History   Socioeconomic History  . Marital status: Married    Spouse name: Not on file  . Number of children: 3  . Years of education: Not on file  . Highest education level: Not on file  Occupational History  . Occupation: retired bookkeeper  Tobacco Use  . Smoking status: Former Smoker    Packs/day: 0.10    Years: 2.00    Pack years: 0.20    Types: Cigarettes    Start date: 11/16/1959    Quit date: 11/15/1960    Years since quitting: 59.1  . Smokeless tobacco: Never Used  Substance and Sexual Activity  . Alcohol use: No    Alcohol/week: 0.0 standard drinks  . Drug use: No  . Sexual activity: Not on file    Comment: menarche age 80, fist live birth 48, P 3, hysterectomy age 29, no HRT, BCP 2 yrs  Other Topics Concern  . Not on file  Social History Narrative   Occupation:  Retired Radiation protection practitioner    Married with 3 grown children      Never Smoked     Alcohol use-no        Social Determinants of Radio broadcast assistant Strain:   . Difficulty of Paying Living Expenses: Not on file  Food Insecurity:   . Worried About Charity fundraiser in the Last Year: Not on file  .  Ran Out of Food in the Last Year: Not on file  Transportation Needs:   . Lack of Transportation (Medical): Not on file  . Lack of Transportation (Non-Medical): Not on file  Physical Activity:   . Days of Exercise per Week: Not on file  . Minutes of Exercise per Session: Not on file  Stress:   . Feeling of Stress : Not on file  Social Connections:   . Frequency of Communication with Friends and Family: Not on file  . Frequency of Social Gatherings with Friends and Family: Not on file  . Attends Religious Services: Not on file  . Active Member of Clubs or Organizations: Not on file  . Attends Archivist Meetings: Not on file  . Marital Status: Not on file  Intimate Partner Violence:   . Fear of Current or Ex-Partner: Not on file    . Emotionally Abused: Not on file  . Physically Abused: Not on file  . Sexually Abused: Not on file    Past Surgical History:  Procedure Laterality Date  . ABDOMINAL HYSTERECTOMY    . APPENDECTOMY    . BILATERAL TOTAL MASTECTOMY WITH AXILLARY LYMPH NODE DISSECTION  08/30/2013   Dr Barry Dienes  . BREAST CYST ASPIRATION     9 cysts  . CATARACT EXTRACTION, BILATERAL  2005/2007  . CHOLECYSTECTOMY    . COLONOSCOPY    . EVACUATION BREAST HEMATOMA Left 08/31/2013   Procedure: EVACUATION HEMATOMA BREAST;  Surgeon: Stark Klein, MD;  Location: [place];  Service: General;  Laterality: Left;  . EYE SURGERY     to repair macular hole  . FOOT ARTHROPLASTY     lt   . GANGLION CYST EXCISION     rt foot  . HEMORRHOID SURGERY     03/1993  . JOINT REPLACEMENT  03/15/11   left knee replacement  . KNEE ARTHROSCOPY     /partial knee 2016/left knee 2012  . MASS EXCISION  11/04/2011   Procedure: EXCISION MASS;  Surgeon: Cammie Sickle., MD;  Location: [place];  Service: Orthopedics;  Laterality: Right;  excisional biopsy right ulna mass  . MASTECTOMY W/ SENTINEL NODE BIOPSY Right 08/30/2013   Procedure: RIGHT  AXILLARY SENTINEL LYMPH NODE BIOPSY; Right Axillary Node Disection;  Surgeon: Stark Klein, MD;  Location: [place];  Service: General;  Laterality: Right;  Right side nuc med 7:00   . PARTIAL KNEE ARTHROPLASTY Right 11/03/2015   Procedure: RIGHT KNEE MEDIAL UNICOMPARTMENTAL ARTHROPLASTY ;  Surgeon: Gaynelle Arabian, MD;  Location[facility];  Service: Orthopedics;  Laterality: Right;  . SIMPLE MASTECTOMY WITH AXILLARY SENTINEL NODE BIOPSY Left 08/30/2013   Procedure: Bilateral Breast Mastectomy ;  Surgeon: Stark Klein, MD;  Location: [place] [facility];  Service: General;  Laterality: Left;  . skin tags removed     breast, panty line, neckline  . TOE SURGERY     preventative crossover toe surg/right foot  . TOE SURGERY  2009   left foot/screw  in 2nd toe  . TONSILLECTOMY    . UPPER  GASTROINTESTINAL ENDOSCOPY      Family History  Problem Relation Age of Onset  . Stroke Mother        died age 56  . Diabetes Mother   . Breast cancer Mother 24  . Breast cancer Sister 69  . Breast cancer Paternal Aunt 77  . Diabetes Maternal Grandfather   . Breast cancer Paternal Grandmother 83  . Breast cancer Paternal Aunt  dx in her 12s  . Cancer Maternal Grandmother        intra-abdominal cancer  . Brain cancer Maternal Uncle 8  . Brain cancer Cousin 34       maternal cousin  . Brain cancer Cousin 20       paternal cousin  . Colon cancer Neg Hx     No Known Allergies  Current Outpatient Medications on File Prior to Visit  Medication Sig Dispense Refill  . Biotin 1000 MCG CHEW Chew by mouth.    . butalbital-acetaminophen-caffeine (FIORICET) 50-325-40 MG tablet Take 1-2 tablets by mouth every 6 (six) hours as needed for headache. 20 tablet 1  . Calcium Carb-Cholecalciferol (CALCIUM-VITAMIN D) 500-400 MG-UNIT TABS Take by mouth 2 (two) times daily.     . cholecalciferol (VITAMIN D) 1000 units tablet Take 2,000 Units by mouth daily.    Marland Kitchen co-enzyme Q-10 30 MG capsule Take 300 mg by mouth daily.    . diazepam (VALIUM) 5 MG tablet TAKE 1 TO 2 TABLETS BY MOUTH AT BEDTIME. TAKE 1 OR 2 PILLS 1 HOUR PRIOR TO DENTAL APPOINTMENT    . diclofenac sodium (VOLTAREN) 1 % GEL as needed.     Marland Kitchen esomeprazole (NEXIUM) 40 MG capsule Take 1 capsule by mouth  daily 90 capsule 3  . levothyroxine (SYNTHROID) 100 MCG tablet TAKE 1 TABLET BY MOUTH ONCE DAILY BEFORE BREAKFAST AND 1 TABLET IN THE MIDDLE OF THE NIGHT 90 tablet 0  . LUTEIN PO Take by mouth.    . meloxicam (MOBIC) 7.5 MG tablet Take 1 tablet by mouth twice daily as needed for pain 180 tablet 0  . methocarbamol (ROBAXIN) 500 MG tablet Take 500 mg by mouth as needed for muscle spasms.    Marland Kitchen omega-3 acid ethyl esters (LOVAZA) 1 g capsule Take 1 capsule by mouth twice daily 180 capsule 0  . oxyCODONE-acetaminophen (PERCOCET) 5-325 MG  tablet Take by mouth as needed for severe pain.    . polyethylene glycol (MIRALAX / GLYCOLAX) packet Take 17 g by mouth daily.    . pregabalin (LYRICA) 75 MG capsule Take 1 capsule (75 mg total) by mouth 2 (two) times daily. 180 capsule 0  . saccharomyces boulardii (FLORASTOR) 250 MG capsule Take 250 mg by mouth daily.    . simvastatin (ZOCOR) 10 MG tablet TAKE 1 TABLET BY MOUTH AT BEDTIME 90 tablet 0  . traMADol (ULTRAM) 50 MG tablet Take 1 tablet (50 mg total) by mouth every 12 (twelve) hours as needed for moderate pain. 30 tablet 2  . TURMERIC PO Take 1,000 mg by mouth daily.    Marland Kitchen venlafaxine XR (EFFEXOR-XR) 37.5 MG 24 hr capsule Take 1 capsule (37.5 mg total) by mouth daily with breakfast. 90 capsule 1  . topiramate (TOPAMAX) 25 MG tablet Take 1 tablet (25 mg total) by mouth daily. 90 tablet 3   No current facility-administered medications on file prior to visit.    BP 120/80   Pulse 65   Temp 97.9 F (36.6 C) (Other (Comment))   Ht 4\' 11"  (1.499 m)   Wt 170 lb (77.1 kg)   SpO2 95%   BMI 34.34 kg/m       Objective:   Physical Exam Vitals and nursing note reviewed.  Constitutional:      Appearance: Normal appearance.  Skin:    General: Skin is warm and dry.     Capillary Refill: Capillary refill takes less than 2 seconds.     Comments: 1 cm  long Seborrheic Keratosis noted on left flank   Neurological:     General: No focal deficit present.     Mental Status: She is alert and oriented to person, place, and time.  Psychiatric:        Mood and Affect: Mood normal.        Behavior: Behavior normal.        Thought Content: Thought content normal.        Judgment: Judgment normal.        Assessment & Plan:  1. Seborrheic keratosis - patient consent was obtained and liquid nitrogen was used for cryotherapy. Patient tolerated procedure well. - Tylenol can be used for pain relief  - return precautions reviewed    [name], NP

## 2019-12-17 ENCOUNTER — Other Ambulatory Visit: Payer: Self-pay

## 2019-12-17 ENCOUNTER — Ambulatory Visit: Payer: Medicare HMO | Attending: Adult Health | Admitting: Physical Therapy

## 2019-12-17 DIAGNOSIS — R293 Abnormal posture: Secondary | ICD-10-CM | POA: Diagnosis present

## 2019-12-17 DIAGNOSIS — R279 Unspecified lack of coordination: Secondary | ICD-10-CM | POA: Insufficient documentation

## 2019-12-17 DIAGNOSIS — M6281 Muscle weakness (generalized): Secondary | ICD-10-CM

## 2019-12-17 NOTE — Patient Instructions (Addendum)
Relaxation Exercises with the Urge to Void   When you experience an urge to void:  FIRST  Stop and stand very still    Sit down if you can    Don't move    You need to stay very still to maintain control  SECOND Squeeze your pelvic floor muscles 5 times, like a quick flick, to keep from leaking  THIRD Relax  Take a deep breath and then let it out  Try to make the urge go away by using relaxation and visualization techniques  FINALLY When you feel the urge go away somewhat, walk normally to the bathroom.   If the urge gets suddenly stronger on the way, you may stop again and relax to regain control.  [facility] [address], [address] [place], [place] [address] Phone # [phone] Fax [phone]  

## 2019-12-17 NOTE — Therapy (Addendum)
[facility] [address], [place], [place], [address] Phone: [phone]   Fax:  [phone]  Physical Therapy Evaluation  Patient Details  Name: Jean Nash MRN: [id number] Date of Birth: [birth year] Referring Provider (PT): [name], NP   Encounter Date: 12/17/2019  PT End of Session - 12/17/19 1105    Visit Number  1    Date for PT Re-Evaluation  02/11/20    PT Start Time  1058    PT Stop Time  1140    PT Time Calculation (min)  42 min    Activity Tolerance  Patient tolerated treatment well    Behavior During Therapy  [facility] for tasks assessed/performed       Past Medical History:  Diagnosis Date  . Anxiety    PHOBIAS  . Arthritis   . Breast cancer ([place]) 08/08/13   right LOQ  . Cataract   . Chronic insomnia   . Cluster headaches    history of migraines / NONE FOR SEVERAL YRS  . Depression   . Fibromyalgia   . GERD (gastroesophageal reflux disease)   . H/O hiatal hernia   . History of colonic polyps   . History of transfusion 08/30/2013  . Hx of radiation therapy 10/29/13- 12/14/13   right chest wall 5040 cGy 28 sessions, right supraclavicular/axillary region 5040 cGy 28 sessions, right chest wall boost 1000 cGy 5 sessions  . Hypothyroidism   . Irritable bowel syndrome   . Kidney stone   . Lymphedema    RT ARM - WEARS SLEEVE  . Macular degeneration    hole/right eye  . Osteopenia   . Other abnormal glucose   . Other and unspecified hyperlipidemia   . Pain in joint, shoulder region   . Pneumonia [id number]  . Sleep apnea    USES C-PAP  . Stress incontinence, female     Past Surgical History:  Procedure Laterality Date  . ABDOMINAL HYSTERECTOMY    . APPENDECTOMY    . BILATERAL TOTAL MASTECTOMY WITH AXILLARY LYMPH NODE DISSECTION  08/30/2013   Dr Barry Dienes  . BREAST CYST ASPIRATION     9 cysts  . CATARACT EXTRACTION, BILATERAL  2005/2007  . CHOLECYSTECTOMY    . COLONOSCOPY    . CYSTOCELE  REPAIR    . EVACUATION BREAST HEMATOMA Left 08/31/2013   Procedure: EVACUATION HEMATOMA BREAST;  Surgeon: Stark Klein, MD;  Location: [place];  Service: General;  Laterality: Left;  . EYE SURGERY     to repair macular hole  . FOOT ARTHROPLASTY     lt   . GANGLION CYST EXCISION     rt foot  . HEMORRHOID SURGERY     03/1993  . JOINT REPLACEMENT  03/15/11   left knee replacement  . KNEE ARTHROSCOPY     /partial knee 2016/left knee 2012  . MASS EXCISION  11/04/2011   Procedure: EXCISION MASS;  Surgeon: Cammie Sickle., MD;  Location: [facility];  Service: Orthopedics;  Laterality: Right;  excisional biopsy right ulna mass  . MASTECTOMY W/ SENTINEL NODE BIOPSY Right 08/30/2013   Procedure: RIGHT  AXILLARY SENTINEL LYMPH NODE BIOPSY; Right Axillary Node Disection;  Surgeon: Stark Klein, MD;  Location: [place];  Service: General;  Laterality: Right;  Right side nuc med 7:00   . PARTIAL KNEE ARTHROPLASTY Right 11/03/2015   Procedure: RIGHT KNEE MEDIAL UNICOMPARTMENTAL ARTHROPLASTY ;  Surgeon: Gaynelle Arabian, MD;  [facility];  Service: Orthopedics;  Laterality:  Right;  Marland Kitchen RECTOCELE REPAIR    . SIMPLE MASTECTOMY WITH AXILLARY SENTINEL NODE BIOPSY Left 08/30/2013   Procedure: Bilateral Breast Mastectomy ;  Surgeon: Stark Klein, MD;  Location: [place];  Service: General;  Laterality: Left;  . skin tags removed     breast, panty line, neckline  . TOE SURGERY     preventative crossover toe surg/right foot  . TOE SURGERY  2009   left foot/screw  in 2nd toe  . TONSILLECTOMY    . UPPER GASTROINTESTINAL ENDOSCOPY      There were no vitals filed for this visit.   Subjective Assessment - 12/17/19 1101    Subjective  Frequent UTIs for a couple of months.  Pt states she has needed a pad for leaking with coughing.  Leaking has gotten worse.    Patient is accompained by:  Family member   husband present for help with history   Patient Stated Goals  leakage decreased and reduced  frequency      Denies pain   [facility] PT Assessment - 12/24/19 0001      Assessment   Medical Diagnosis  N32.81 (ICD-10-CM) - OAB (overactive bladder)    Referring Provider (PT)  Nafziger, Tommi Rumps, NP    Prior Therapy  No      Precautions   Precautions  None      Restrictions   Weight Bearing Restrictions  No      Balance Screen   Has the patient fallen in the past 6 months  No      [place] residence    Living Arrangements  Spouse/significant other      Prior Function   Level of [place]  Retired      Associate Professor   Overall Cognitive Status  Within Functional Limits for tasks assessed      Posture/Postural Control   Posture/Postural Control  Postural limitations    Postural Limitations  Rounded Shoulders      AROM   Overall AROM Comments  lumbar flexion 25% limited      Strength   Overall Strength Comments  Lt hip 4/5; Rt 5/5      Palpation   Palpation comment  abdominal wall decreased tone      Ambulation/Gait   Gait Pattern  Decreased stride length                Outpatient Rehab from 08/24/2018 in [place]  Lymphedema Life Impact Scale Total Score  27.94 %      Objective measurements completed on examination: See above findings.    Pelvic Floor Special Questions - 12/24/19 0001    Prior Pelvic/Prostate Exam  Yes    Prior Urinalysis  Yes    Result of last Urinalysis  has chronic UTI    Prior Pregnancies  Yes    Number of Pregnancies  3    Number of Vaginal Deliveries  3    Any difficulty with labor and deliveries  --   tearing during the first one   Urinary Leakage  Yes    How often  little bit throughout the day; can't tell when    Activities that cause leaking  Coughing;Sneezing;Laughing    Urinary urgency  Yes    Urinary frequency  hour    Fluid intake  1.5 liters    Caffeine beverages  24 ounces coffee    Skin Integrity  Intact  Perineal Body/Introitus   Gaping    Prolapse  None    Pelvic Floor Internal Exam  pt identity confirmed and informed consent given to perform internal assessment    Exam Type  Vaginal    Sensation  decreased    Palpation  fascial restreiction Lt urethra    Strength  Flicker    Tone  low       [facility] Adult PT Treatment/Exercise - 12/17/19 0001      Self-Care   Self-Care  Other Self-Care Comments    Other Self-Care Comments   urge to void             PT Education - 12/24/19 1939    Education Details  urge to void    Person(s) Educated  Patient;Spouse    Methods  Explanation;Handout    Comprehension  Verbalized understanding       PT Short Term Goals - 12/24/19 1918      PT SHORT TERM GOAL #1   Title  pt will be ind with initial HEP    Time  4    Period  Weeks    Status  New    Target Date  01/14/20      PT SHORT TERM GOAL #2   Title  Pt will be get the urge to void before leaking at least 60% of the time    Time  4    Period  Weeks    Status  New    Target Date  01/14/20        PT Long Term Goals - 12/17/19 1104      PT LONG TERM GOAL #1   Title  ind with advanced HEP    Time  8    Period  Weeks    Status  New    Target Date  02/11/20      PT LONG TERM GOAL #2   Title  Pt will have nocturia of 1x or less due to improved pelvic muscle tone    Baseline  nocturia 2-4x    Time  8    Period  Weeks    Status  New    Target Date  02/11/20      PT LONG TERM GOAL #3   Title  Pt will be able to cough, sneeze or laugh without leakage    Baseline  every time    Time  8    Status  New    Target Date  02/11/20      PT LONG TERM GOAL #4   Title  Pt will demonstrate imporved abdominal tone and be able to activate TrA during transfers such as supine to sit for reduced leakage    Time  8    Period  Weeks    Status  New    Target Date  02/11/20             Plan - 12/24/19 1922    Clinical Impression Statement  Pt presents to clinic with her spouse  for assistance with history taking and for her comfort.  Pt has been having increased urinary leakage and OAB symptoms.  Pt has comorbitities listed below.  She has LE weakness and decreased ROM in lumbar region.  Pt has very weak pelvic floor with decreased coordination and decreased sensation.  Pelvic floor weakness may play a role in chronic UTIs.  Pt will benefit from skilled PT for improved independence of toileting and reduced nocturia which puts her  at higher risk for falling.  Pt also has postural and gait deficits and will benefit from skilled PT to address all of these impairments for maximum functional and quality of life outcomes.    Personal Factors and Comorbidities  Comorbidity 3+    Comorbidities  Anxiety, Arthritis, Breast cancer ([place]) (08/08/13), Cataract, Chronic insomnia, Cluster headaches, Depression, Fibromyalgia, GERD (gastroesophageal reflux disease), H/O hiatal hernia, History of colonic polyps, History of transfusion (08/30/2013), radiation therapy (10/29/13- 12/14/13), Hypothyroidism, Irritable bowel syndrome, Kidney stone, Lymphedema, Macular degeneration, Osteopenia, Other abnormal glucose    Examination-Activity Limitations  Transfers;Toileting;Continence    Clinical Decision Making  Moderate    Rehab Potential  Excellent    PT Frequency  2x / week    PT Duration  8 weeks    PT Treatment/Interventions  ADLs/Self Care Home Management;Biofeedback;Cryotherapy;Electrical Stimulation;Moist Heat;Therapeutic activities;Therapeutic exercise;Neuromuscular re-education;Patient/family education;Manual techniques;Passive range of motion;Dry needling    PT Next Visit Plan  biofeedback, internal STM to stimulate circular contraction; discuess estim for home    PT Home Exercise Plan  urge to void    Consulted and Agree with Plan of Care  Patient;Family member/caregiver    Family Member Consulted  husband helps with all of her therapy due to her memory problems       Patient will benefit  from skilled therapeutic intervention in order to improve the following deficits and impairments:  Abnormal gait, Decreased strength, Decreased coordination, Impaired tone, Postural dysfunction, Pain  Visit Diagnosis: Muscle weakness (generalized) - Plan: PT plan of care cert/re-cert  Abnormal posture - Plan: PT plan of care cert/re-cert  Unspecified lack of coordination - Plan: PT plan of care cert/re-cert     Problem List Patient Active Problem List   Diagnosis Date Noted  . MDD (major depressive disorder), recurrent, in full remission ([place]) 12/10/2019  . Pulmonary fibrosis ([place]) 12/04/2018  . MDD (major depressive disorder), recurrent episode, mild ([place]) 12/22/2017  . Osteoporosis 07/14/2017  . OA (osteoarthritis) of knee 11/03/2015  . Osteopenia 08/08/2015  . Headache 02/03/2015  . Atypical chest pain 07/15/2014  . Arthralgia 02/22/2014  . Psoriasis 02/22/2014  . Malignant neoplasm of lower-outer quadrant of right breast of female, estrogen receptor positive ([place]) 08/09/2013  . Neck pain 06/22/2013  . Left knee pain 11/29/2012  . Reactive depression (situational) 04/26/2012  . Right wrist pain 02/02/2012  . Right foot pain 10/04/2011  . Nevus 06/05/2011  . Status post total knee replacement 04/06/2011  . Overactive bladder 03/14/2011  . KNEE PAIN, LEFT 08/31/2010  . HEARING LOSS 08/17/2010  . HIRSUTISM 06/16/2009  . CYST, IDIOPATHIC 05/20/2008  . IRRITABLE BOWEL SYNDROME 04/15/2008  . Allergic asthma, mild intermittent, uncomplicated [id number]  . GERD 03/19/2008  . COLONIC POLYPS, HX OF 03/19/2008  . NEPHROLITHIASIS, HX OF 03/19/2008  . Hypothyroidism 03/18/2008  . Hyperlipidemia 03/18/2008  . INSOMNIA, CHRONIC 03/18/2008  . FIBROMYALGIA 03/18/2008  . Prediabetes 03/18/2008    Jule Ser, PT 12/24/2019, 7:39 PM  [facility] [address], [place], [place], [address] Phone: [phone]   Fax:   [phone]  Name: Jean Nash MRN: [id number] Date of Birth: [birth year]

## 2019-12-18 NOTE — Telephone Encounter (Signed)
Please advise 

## 2019-12-19 ENCOUNTER — Encounter: Payer: Self-pay | Admitting: Adult Health

## 2019-12-20 ENCOUNTER — Encounter: Payer: Self-pay | Admitting: Adult Health

## 2019-12-20 ENCOUNTER — Telehealth (INDEPENDENT_AMBULATORY_CARE_PROVIDER_SITE_OTHER): Payer: Medicare HMO | Admitting: Adult Health

## 2019-12-20 ENCOUNTER — Other Ambulatory Visit: Payer: Self-pay

## 2019-12-20 DIAGNOSIS — K219 Gastro-esophageal reflux disease without esophagitis: Secondary | ICD-10-CM | POA: Diagnosis not present

## 2019-12-20 DIAGNOSIS — G629 Polyneuropathy, unspecified: Secondary | ICD-10-CM

## 2019-12-20 MED ORDER — PREGABALIN 150 MG PO CAPS: 150.0000 mg | ORAL_CAPSULE | Freq: Two times a day (BID) | ORAL | 1 refills | Status: DC

## 2019-12-20 MED ORDER — ESOMEPRAZOLE MAGNESIUM 40 MG PO CPDR: DELAYED_RELEASE_CAPSULE | ORAL | 3 refills | Status: DC

## 2019-12-20 NOTE — Progress Notes (Signed)
Virtual Visit via Video Note  I connected with Jean Nash on 12/20/19 at  1:00 PM EST by a video enabled telemedicine application and verified that I am speaking with the correct person using two identifiers.  Location patient: home Location provider:work or home office Persons participating in the virtual visit: patient, provider  I discussed the limitations of evaluation and management by telemedicine and the availability of in person appointments. The patient expressed understanding and agreed to proceed.   HPI: 79 year old female who is being evaluated today for neuropathic pain.  Is a chronic issue that is post bilateral mastectomy with sentinel node biopsy in 2014.  Her neuropathic pain is worse on the right than on the left in the past has been well controlled with Lyrica 75 mg.  Recently she started to experiencing more frequent and worse neuropathic pain.  At times she will often get a "jolt" in her right axilla area.  She is wondering if we can possibly go up on Lyrica to help manage her pain better.  She also needs a refill of her PPI, this was sent in on 12/06/2019 but [facility] never received the prescription   ROS: See pertinent positives and negatives per HPI.  Past Medical History:  Diagnosis Date  . Anxiety    PHOBIAS  . Arthritis   . Breast cancer ([place]) 08/08/13   right LOQ  . Cataract   . Chronic insomnia   . Cluster headaches    history of migraines / NONE FOR SEVERAL YRS  . Depression   . Fibromyalgia   . GERD (gastroesophageal reflux disease)   . H/O hiatal hernia   . History of colonic polyps   . History of transfusion 08/30/2013  . Hx of radiation therapy 10/29/13- 12/14/13   right chest wall 5040 cGy 28 sessions, right supraclavicular/axillary region 5040 cGy 28 sessions, right chest wall boost 1000 cGy 5 sessions  . Hypothyroidism   . Irritable bowel syndrome   . Kidney stone   . Lymphedema    RT ARM - WEARS SLEEVE  . Macular degeneration    hole/right eye  . Osteopenia   . Other abnormal glucose   . Other and unspecified hyperlipidemia   . Pain in joint, shoulder region   . Pneumonia [id number]  . Sleep apnea    USES C-PAP  . Stress incontinence, female     Past Surgical History:  Procedure Laterality Date  . ABDOMINAL HYSTERECTOMY    . APPENDECTOMY    . BILATERAL TOTAL MASTECTOMY WITH AXILLARY LYMPH NODE DISSECTION  08/30/2013   Dr Barry Dienes  . BREAST CYST ASPIRATION     9 cysts  . CATARACT EXTRACTION, BILATERAL  2005/2007  . CHOLECYSTECTOMY    . COLONOSCOPY    . EVACUATION BREAST HEMATOMA Left 08/31/2013   Procedure: EVACUATION HEMATOMA BREAST;  Surgeon: Stark Klein, MD;  Location: [place];  Service: General;  Laterality: Left;  . EYE SURGERY     to repair macular hole  . FOOT ARTHROPLASTY     lt   . GANGLION CYST EXCISION     rt foot  . HEMORRHOID SURGERY     03/1993  . JOINT REPLACEMENT  03/15/11   left knee replacement  . KNEE ARTHROSCOPY     /partial knee 2016/left knee 2012  . MASS EXCISION  11/04/2011   Procedure: EXCISION MASS;  Surgeon: Cammie Sickle., MD;  Location: [place];  Service: Orthopedics;  Laterality: Right;  excisional biopsy right ulna mass  .  MASTECTOMY W/ SENTINEL NODE BIOPSY Right 08/30/2013   Procedure: RIGHT  AXILLARY SENTINEL LYMPH NODE BIOPSY; Right Axillary Node Disection;  Surgeon: Stark Klein, MD;  Location: [place];  Service: General;  Laterality: Right;  Right side nuc med 7:00   . PARTIAL KNEE ARTHROPLASTY Right 11/03/2015   Procedure: RIGHT KNEE MEDIAL UNICOMPARTMENTAL ARTHROPLASTY ;  Surgeon: Gaynelle Arabian, MD;  [facility];  Service: Orthopedics;  Laterality: Right;  . SIMPLE MASTECTOMY WITH AXILLARY SENTINEL NODE BIOPSY Left 08/30/2013   Procedure: Bilateral Breast Mastectomy ;  Surgeon: Stark Klein, MD;  Location: [place];  Service: General;  Laterality: Left;  . skin tags removed     breast, panty line, neckline  . TOE SURGERY     preventative  crossover toe surg/right foot  . TOE SURGERY  2009   left foot/screw  in 2nd toe  . TONSILLECTOMY    . UPPER GASTROINTESTINAL ENDOSCOPY      Family History  Problem Relation Age of Onset  . Stroke Mother        died age 68  . Diabetes Mother   . Breast cancer Mother 60  . Breast cancer Sister 51  . Breast cancer Paternal Aunt 100  . Diabetes Maternal Grandfather   . Breast cancer Paternal Grandmother 31  . Breast cancer Paternal Aunt        dx in her 46s  . Cancer Maternal Grandmother        intra-abdominal cancer  . Brain cancer Maternal Uncle 8  . Brain cancer Cousin 21       maternal cousin  . Brain cancer Cousin 20       paternal cousin  . Colon cancer Neg Hx        Current Outpatient Medications:  .  Biotin 1000 MCG CHEW, Chew by mouth., Disp: , Rfl:  .  butalbital-acetaminophen-caffeine (FIORICET) 50-325-40 MG tablet, Take 1-2 tablets by mouth every 6 (six) hours as needed for headache., Disp: 20 tablet, Rfl: 1 .  Calcium Carb-Cholecalciferol (CALCIUM-VITAMIN D) 500-400 MG-UNIT TABS, Take by mouth 2 (two) times daily. , Disp: , Rfl:  .  cholecalciferol (VITAMIN D) 1000 units tablet, Take 2,000 Units by mouth daily., Disp: , Rfl:  .  co-enzyme Q-10 30 MG capsule, Take 300 mg by mouth daily., Disp: , Rfl:  .  diazepam (VALIUM) 5 MG tablet, TAKE 1 TO 2 TABLETS BY MOUTH AT BEDTIME. TAKE 1 OR 2 PILLS 1 HOUR PRIOR TO DENTAL APPOINTMENT, Disp: , Rfl:  .  diclofenac sodium (VOLTAREN) 1 % GEL, as needed. , Disp: , Rfl:  .  esomeprazole (NEXIUM) 40 MG capsule, Take 1 capsule by mouth  daily, Disp: 90 capsule, Rfl: 3 .  levothyroxine (SYNTHROID) 100 MCG tablet, TAKE 1 TABLET BY MOUTH ONCE DAILY BEFORE BREAKFAST AND 1 TABLET IN THE MIDDLE OF THE NIGHT, Disp: 90 tablet, Rfl: 0 .  LUTEIN PO, Take by mouth., Disp: , Rfl:  .  meloxicam (MOBIC) 7.5 MG tablet, Take 1 tablet by mouth twice daily as needed for pain, Disp: 180 tablet, Rfl: 0 .  methocarbamol (ROBAXIN) 500 MG tablet, Take  500 mg by mouth as needed for muscle spasms., Disp: , Rfl:  .  omega-3 acid ethyl esters (LOVAZA) 1 g capsule, Take 1 capsule by mouth twice daily, Disp: 180 capsule, Rfl: 0 .  oxyCODONE-acetaminophen (PERCOCET) 5-325 MG tablet, Take by mouth as needed for severe pain., Disp: , Rfl:  .  polyethylene glycol (MIRALAX / GLYCOLAX) packet, Take  17 g by mouth daily., Disp: , Rfl:  .  pregabalin (LYRICA) 75 MG capsule, Take 1 capsule (75 mg total) by mouth 2 (two) times daily., Disp: 180 capsule, Rfl: 0 .  saccharomyces boulardii (FLORASTOR) 250 MG capsule, Take 250 mg by mouth daily., Disp: , Rfl:  .  simvastatin (ZOCOR) 10 MG tablet, TAKE 1 TABLET BY MOUTH AT BEDTIME, Disp: 90 tablet, Rfl: 0 .  topiramate (TOPAMAX) 25 MG tablet, Take 1 tablet (25 mg total) by mouth daily., Disp: 90 tablet, Rfl: 3 .  traMADol (ULTRAM) 50 MG tablet, Take 1 tablet (50 mg total) by mouth every 12 (twelve) hours as needed for moderate pain., Disp: 30 tablet, Rfl: 2 .  TURMERIC PO, Take 1,000 mg by mouth daily., Disp: , Rfl:  .  venlafaxine XR (EFFEXOR-XR) 37.5 MG 24 hr capsule, Take 1 capsule (37.5 mg total) by mouth daily with breakfast., Disp: 90 capsule, Rfl: 1  EXAM:  VITALS per patient if applicable:  GENERAL: alert, oriented, appears well and in no acute distress  HEENT: atraumatic, conjunttiva clear, no obvious abnormalities on inspection of external nose and ears  NECK: normal movements of the head and neck  LUNGS: on inspection no signs of respiratory distress, breathing rate appears normal, no obvious gross SOB, gasping or wheezing  CV: no obvious cyanosis  MS: moves all visible extremities without noticeable abnormality  PSYCH/NEURO: pleasant and cooperative, no obvious depression or anxiety, speech and thought processing grossly intact  ASSESSMENT AND PLAN:  Discussed the following assessment and plan:  1. Neuropathy - I am ok with her increasing Lyrica to 150 mg. She can take two tabs of what  she currently has at home BID - pregabalin (LYRICA) 150 MG capsule; Take 1 capsule (150 mg total) by mouth 2 (two) times daily.  Dispense: 180 capsule; Refill: 1  2. Gastroesophageal reflux disease without esophagitis  - esomeprazole (NEXIUM) 40 MG capsule; Take 1 capsule by mouth  daily  Dispense: 90 capsule; Refill: 3     I discussed the assessment and treatment plan with the patient. The patient was provided an opportunity to ask questions and all were answered. The patient agreed with the plan and demonstrated an understanding of the instructions.   The patient was advised to call back or seek an in-person evaluation if the symptoms worsen or if the condition fails to improve as anticipated.   [name], NP

## 2019-12-21 ENCOUNTER — Encounter: Payer: Self-pay | Admitting: Adult Health

## 2019-12-21 NOTE — Telephone Encounter (Signed)
FYI

## 2019-12-23 ENCOUNTER — Ambulatory Visit: Payer: Medicare HMO | Attending: Internal Medicine

## 2019-12-23 DIAGNOSIS — Z23 Encounter for immunization: Secondary | ICD-10-CM

## 2019-12-23 NOTE — Progress Notes (Signed)
   Covid-19 Vaccination Clinic  Name:  Chizuko Chinen    MRN: [id number] DOB: July 27, 1941  12/23/2019  Ms. Yusupov was observed post Covid-19 immunization for 15 minutes without incidence. She was provided with Vaccine Information Sheet and instruction to access the V-Safe system.   Ms. Stormes was instructed to call 911 with any severe reactions post vaccine: Marland Kitchen Difficulty breathing  . Swelling of your face and throat  . A fast heartbeat  . A bad rash all over your body  . Dizziness and weakness    Immunizations Administered    Name Date Dose VIS Date Route   Pfizer COVID-19 Vaccine 12/23/2019  4:10 PM 0.3 mL 10/26/2019 Intramuscular   Manufacturer: [place]   Lot: [id number]   [place]: [id number]

## 2019-12-24 NOTE — Addendum Note (Signed)
Addended by: Su Hoff on: 12/24/2019 07:40 PM   Modules accepted: Orders

## 2019-12-25 ENCOUNTER — Encounter: Payer: Self-pay | Admitting: Adult Health

## 2019-12-25 ENCOUNTER — Ambulatory Visit: Payer: Medicare HMO | Admitting: Physical Therapy

## 2019-12-25 ENCOUNTER — Other Ambulatory Visit: Payer: Self-pay

## 2019-12-25 DIAGNOSIS — R279 Unspecified lack of coordination: Secondary | ICD-10-CM

## 2019-12-25 DIAGNOSIS — M6281 Muscle weakness (generalized): Secondary | ICD-10-CM

## 2019-12-25 DIAGNOSIS — R293 Abnormal posture: Secondary | ICD-10-CM

## 2019-12-25 NOTE — Patient Instructions (Signed)
Access Code: [email or web address]  URL: [email or web address]  Date: 12/25/2019  Prepared by: Jari Favre   Exercises Standing Bicep Curls with Resistance - 10 reps - 2 sets - 1x daily - 7x weekly Supine Hip Adductor Squeeze with Small Ball - 10 reps - 2 sets - 3 sec hold - 1x daily - 7x weekly

## 2019-12-25 NOTE — Therapy (Signed)
[facility] [address] [place], [place], [address] Phone: [phone]   Fax:  [phone]  Physical Therapy Treatment  Patient Details  Name: Jean Nash MRN: [id number] Date of Birth: [birth year] Referring Provider (PT): [name], NP   Encounter Date: 12/25/2019  PT End of Session - 12/25/19 1059    Visit Number  2    Date for PT Re-Evaluation  02/11/20    PT Start Time  [id number]    PT Stop Time  1058    PT Time Calculation (min)  43 min    Activity Tolerance  Patient tolerated treatment well    Behavior During Therapy  [facility] for tasks assessed/performed       Past Medical History:  Diagnosis Date  . Anxiety    PHOBIAS  . Arthritis   . Breast cancer ([place]) 08/08/13   right LOQ  . Cataract   . Chronic insomnia   . Cluster headaches    history of migraines / NONE FOR SEVERAL YRS  . Depression   . Fibromyalgia   . GERD (gastroesophageal reflux disease)   . H/O hiatal hernia   . History of colonic polyps   . History of transfusion 08/30/2013  . Hx of radiation therapy 10/29/13- 12/14/13   right chest wall 5040 cGy 28 sessions, right supraclavicular/axillary region 5040 cGy 28 sessions, right chest wall boost 1000 cGy 5 sessions  . Hypothyroidism   . Irritable bowel syndrome   . Kidney stone   . Lymphedema    RT ARM - WEARS SLEEVE  . Macular degeneration    hole/right eye  . Osteopenia   . Other abnormal glucose   . Other and unspecified hyperlipidemia   . Pain in joint, shoulder region   . Pneumonia [id number]  . Sleep apnea    USES C-PAP  . Stress incontinence, female     Past Surgical History:  Procedure Laterality Date  . ABDOMINAL HYSTERECTOMY    . APPENDECTOMY    . BILATERAL TOTAL MASTECTOMY WITH AXILLARY LYMPH NODE DISSECTION  08/30/2013   Dr Barry Dienes  . BREAST CYST ASPIRATION     9 cysts  . CATARACT EXTRACTION, BILATERAL  2005/2007  . CHOLECYSTECTOMY    . COLONOSCOPY    . CYSTOCELE  REPAIR    . EVACUATION BREAST HEMATOMA Left 08/31/2013   Procedure: EVACUATION HEMATOMA BREAST;  Surgeon: Stark Klein, MD;  Location: [place];  Service: General;  Laterality: Left;  . EYE SURGERY     to repair macular hole  . FOOT ARTHROPLASTY     lt   . GANGLION CYST EXCISION     rt foot  . HEMORRHOID SURGERY     03/1993  . JOINT REPLACEMENT  03/15/11   left knee replacement  . KNEE ARTHROSCOPY     /partial knee 2016/left knee 2012  . MASS EXCISION  11/04/2011   Procedure: EXCISION MASS;  Surgeon: Cammie Sickle., MD;  Location: [place];  Service: Orthopedics;  Laterality: Right;  excisional biopsy right ulna mass  . MASTECTOMY W/ SENTINEL NODE BIOPSY Right 08/30/2013   Procedure: RIGHT  AXILLARY SENTINEL LYMPH NODE BIOPSY; Right Axillary Node Disection;  Surgeon: Stark Klein, MD;  Location: [place];  Service: General;  Laterality: Right;  Right side nuc med 7:00   . PARTIAL KNEE ARTHROPLASTY Right 11/03/2015   Procedure: RIGHT KNEE MEDIAL UNICOMPARTMENTAL ARTHROPLASTY ;  Surgeon: Gaynelle Arabian, MD;  Location[facility];  Service: Orthopedics;  Laterality:  Right;  Marland Kitchen RECTOCELE REPAIR    . SIMPLE MASTECTOMY WITH AXILLARY SENTINEL NODE BIOPSY Left 08/30/2013   Procedure: Bilateral Breast Mastectomy ;  Surgeon: Stark Klein, MD;  Location: [place];  Service: General;  Laterality: Left;  . skin tags removed     breast, panty line, neckline  . TOE SURGERY     preventative crossover toe surg/right foot  . TOE SURGERY  2009   left foot/screw  in 2nd toe  . TONSILLECTOMY    . UPPER GASTROINTESTINAL ENDOSCOPY      There were no vitals filed for this visit.  Subjective Assessment - 12/25/19 1019    Subjective  Pt states she has had almost no leakage due to doing the urge to void techniques    Patient Stated Goals  leakage decreased and reduced frequency    Currently in Pain?  No/denies                  Outpatient Re[facility] from [facility]/24/[facility]  Lymphedema Life Impact Scale Total Score  27.94 %           [facility] Adult PT Treatment/Exercise - 12/25/19 0001      Exercises   Exercises  Lumbar      Lumbar Exercises: Seated   Other Seated Lumbar Exercises  ball squeeze with kegel - TC to cue left instead of bulge - 20x      Lumbar Exercises: Supine   Ab Set  20 reps    AB Set Limitations  make "s" sound to exhale with TrA contraction    Pelvic Tilt  20 reps    Pelvic Tilt Limitations  lift tailbone and engaged TrA    Other Supine Lumbar Exercises  bicep curl TrA engaged - red band - 20x      Manual Therapy   Manual therapy comments  supine - scar tissue massage and fascial release to bilater breast incisions             PT Education - 12/25/19 1059    Education Details  Access Code: [email or web address]    Person(s) Educated  Patient;Spouse    Methods  Explanation;Demonstration;Tactile cues;Verbal cues;Handout    Comprehension  Verbalized understanding;Returned demonstration       PT Short Term Goals - 12/25/19 1019      PT SHORT TERM GOAL #1   Title  pt will be ind with initial HEP    Status  On-going      PT SHORT TERM GOAL #2   Title  Pt will be get the urge to void before leaking at least 60% of the time    Baseline  can tell when she has to go every time    Status  Achieved        PT Long Term Goals - 12/25/19 1021      PT LONG TERM GOAL #1   Title  ind with advanced HEP      PT LONG TERM GOAL #2   Title  Pt will have nocturia of 1x or less due to improved pelvic muscle tone    Baseline  nocturia 2-4x    Status  On-going      PT LONG TERM GOAL #3   Title  Pt will be able to cough, sneeze or laugh without leakage    Baseline  did one time this week    Status  On-going      PT LONG TERM GOAL #4  Title  Pt will demonstrate imporved abdominal tone and be able to activate TrA during transfers such as supine to sit for reduced leakage    Status  On-going             Plan - 12/25/19 1100    Clinical Impression Statement  Pt needed verbal and tactile cues to correctly engage pelvic floor and TrA. Pt did well after repeating several variations of exercises to engage those specific muscle groups.  Pt has scar restrictions that loosened slightly with gentle fascial release. Pt has made good progress with use of urge to void techniques.  She will benefit from skilled PT to continue working on strength and muscle coordination.    Comorbidities  Anxiety, Arthritis, Breast cancer ([place]) (08/08/13), Cataract, Chronic insomnia, Cluster headaches, Depression, Fibromyalgia, GERD (gastroesophageal reflux disease), H/O hiatal hernia, History of colonic polyps, History of transfusion (08/30/2013), radiation therapy (10/29/13- 12/14/13), Hypothyroidism, Irritable bowel syndrome, Kidney stone, Lymphedema, Macular degeneration, Osteopenia, Other abnormal glucose    PT Treatment/Interventions  ADLs/Self Care Home Management;Biofeedback;Cryotherapy;Electrical Stimulation;Moist Heat;Therapeutic activities;Therapeutic exercise;Neuromuscular re-education;Patient/family education;Manual techniques;Passive range of motion;Dry needling    PT Next Visit Plan  biofeedback, internal STM to stimulate circular contraction; progress core and pelvic floor strength and endurance    PT Home Exercise Plan  Access Code: [email or web address]    Consulted and Agree with Plan of Care  Patient;Family member/caregiver    Family Member Consulted  husband helps with all of her therapy due to her memory problems       Patient will benefit from skilled therapeutic intervention in order to improve the following deficits and impairments:  Abnormal gait, Decreased strength, Decreased coordination, Impaired tone, Postural dysfunction, Pain  Visit Diagnosis: Muscle weakness (generalized)  Abnormal posture  Unspecified lack of coordination     Problem List Patient Active Problem List   Diagnosis Date  Noted  . MDD (major depressive disorder), recurrent, in full remission ([place]) 12/10/2019  . Pulmonary fibrosis ([place]) 12/04/2018  . MDD (major depressive disorder), recurrent episode, mild ([place]) 12/22/2017  . Osteoporosis 07/14/2017  . OA (osteoarthritis) of knee 11/03/2015  . Osteopenia 08/08/2015  . Headache 02/03/2015  . Atypical chest pain 07/15/2014  . Arthralgia 02/22/2014  . Psoriasis 02/22/2014  . Malignant neoplasm of lower-outer quadrant of right breast of female, estrogen receptor positive ([place]) 08/09/2013  . Neck pain 06/22/2013  . Left knee pain 11/29/2012  . Reactive depression (situational) 04/26/2012  . Right wrist pain 02/02/2012  . Right foot pain 10/04/2011  . Nevus 06/05/2011  . Status post total knee replacement 04/06/2011  . Overactive bladder 03/14/2011  . KNEE PAIN, LEFT 08/31/2010  . HEARING LOSS 08/17/2010  . HIRSUTISM 06/16/2009  . CYST, IDIOPATHIC 05/20/2008  . IRRITABLE BOWEL SYNDROME 04/15/2008  . Allergic asthma, mild intermittent, uncomplicated [id number]  . GERD 03/19/2008  . COLONIC POLYPS, HX OF 03/19/2008  . NEPHROLITHIASIS, HX OF 03/19/2008  . Hypothyroidism 03/18/2008  . Hyperlipidemia 03/18/2008  . INSOMNIA, CHRONIC 03/18/2008  . FIBROMYALGIA 03/18/2008  . Prediabetes 03/18/2008    Jule Ser, PT 12/25/2019, 11:31 AM  [facility] [address], [place], [place], [address] Phone: [phone]   Fax:  [phone]  Name: Jean Nash MRN: [id number] Date of Birth: [birth year]

## 2019-12-26 NOTE — Telephone Encounter (Signed)
Please advise 

## 2019-12-27 ENCOUNTER — Ambulatory Visit: Payer: Medicare HMO | Admitting: Physical Therapy

## 2019-12-27 ENCOUNTER — Other Ambulatory Visit: Payer: Self-pay

## 2019-12-27 ENCOUNTER — Encounter: Payer: Self-pay | Admitting: Physical Therapy

## 2019-12-27 DIAGNOSIS — M6281 Muscle weakness (generalized): Secondary | ICD-10-CM | POA: Diagnosis not present

## 2019-12-27 DIAGNOSIS — R293 Abnormal posture: Secondary | ICD-10-CM

## 2019-12-27 DIAGNOSIS — R279 Unspecified lack of coordination: Secondary | ICD-10-CM

## 2019-12-27 NOTE — Therapy (Signed)
[facility] [address], [place], [place], [address] Phone: [phone]   Fax:  [phone]  Physical Therapy Treatment  Patient Details  Name: Jean Nash MRN: [id number] Date of Birth: 22-Feb-1941 Referring Provider (PT): [name], NP   Encounter Date: 12/27/2019  PT End of Session - 12/27/19 1358    Visit Number  3    Date for PT Re-Evaluation  02/11/20    PT Start Time  1142    PT Stop Time  1225    PT Time Calculation (min)  43 min    Activity Tolerance  Patient tolerated treatment well    Behavior During Therapy  [facility] for tasks assessed/performed       Past Medical History:  Diagnosis Date  . Anxiety    PHOBIAS  . Arthritis   . Breast cancer ([place]) 08/08/13   right LOQ  . Cataract   . Chronic insomnia   . Cluster headaches    history of migraines / NONE FOR SEVERAL YRS  . Depression   . Fibromyalgia   . GERD (gastroesophageal reflux disease)   . H/O hiatal hernia   . History of colonic polyps   . History of transfusion 08/30/2013  . Hx of radiation therapy 10/29/13- 12/14/13   right chest wall 5040 cGy 28 sessions, right supraclavicular/axillary region 5040 cGy 28 sessions, right chest wall boost 1000 cGy 5 sessions  . Hypothyroidism   . Irritable bowel syndrome   . Kidney stone   . Lymphedema    RT ARM - WEARS SLEEVE  . Macular degeneration    hole/right eye  . Osteopenia   . Other abnormal glucose   . Other and unspecified hyperlipidemia   . Pain in joint, shoulder region   . Pneumonia [id number]  . Sleep apnea    USES C-PAP  . Stress incontinence, female     Past Surgical History:  Procedure Laterality Date  . ABDOMINAL HYSTERECTOMY    . APPENDECTOMY    . BILATERAL TOTAL MASTECTOMY WITH AXILLARY LYMPH NODE DISSECTION  08/30/2013   Dr Barry Dienes  . BREAST CYST ASPIRATION     9 cysts  . CATARACT EXTRACTION, BILATERAL  2005/2007  . CHOLECYSTECTOMY    . COLONOSCOPY    . CYSTOCELE  REPAIR    . EVACUATION BREAST HEMATOMA Left 08/31/2013   Procedure: EVACUATION HEMATOMA BREAST;  Surgeon: Stark Klein, MD;  Location: [place];  Service: General;  Laterality: Left;  . EYE SURGERY     to repair macular hole  . FOOT ARTHROPLASTY     lt   . GANGLION CYST EXCISION     rt foot  . HEMORRHOID SURGERY     03/1993  . JOINT REPLACEMENT  03/15/11   left knee replacement  . KNEE ARTHROSCOPY     /partial knee 2016/left knee 2012  . MASS EXCISION  11/04/2011   Procedure: EXCISION MASS;  Surgeon: Cammie Sickle., MD;  Location: [place];  Service: Orthopedics;  Laterality: Right;  excisional biopsy right ulna mass  . MASTECTOMY W/ SENTINEL NODE BIOPSY Right 08/30/2013   Procedure: RIGHT  AXILLARY SENTINEL LYMPH NODE BIOPSY; Right Axillary Node Disection;  Surgeon: Stark Klein, MD;  Location: [place];  Service: General;  Laterality: Right;  Right side nuc med 7:00   . PARTIAL KNEE ARTHROPLASTY Right 11/03/2015   Procedure: RIGHT KNEE MEDIAL UNICOMPARTMENTAL ARTHROPLASTY ;  Surgeon: Gaynelle Arabian, MD;  Location: [facility];  Service: Orthopedics;  Laterality:  Right;  Marland Kitchen RECTOCELE REPAIR    . SIMPLE MASTECTOMY WITH AXILLARY SENTINEL NODE BIOPSY Left 08/30/2013   Procedure: Bilateral Breast Mastectomy ;  Surgeon: Stark Klein, MD;  Location: [place];  Service: General;  Laterality: Left;  . skin tags removed     breast, panty line, neckline  . TOE SURGERY     preventative crossover toe surg/right foot  . TOE SURGERY  2009   left foot/screw  in 2nd toe  . TONSILLECTOMY    . UPPER GASTROINTESTINAL ENDOSCOPY      There were no vitals filed for this visit.  Subjective Assessment - 12/27/19 1156    Subjective  Pt states she is almost completely dry and has been doing great.    Patient is accompained by:  Family member   husband   Patient Stated Goals  leakage decreased and reduced frequency    Currently in Pain?  No/denies                  Outpatient Rehab  [facility]  Lymphedema Life Impact Scale Total Score  27.94 %           [facility] Adult PT Treatment/Exercise - 12/27/19 0001      Lumbar Exercises: Standing   Functional Squats Limitations  mini squat with kegel x 10 eccentric lowering    Lifting  From waist;20 reps   to shoulder height   Lifting Weights (lbs)  2    Other Standing Lumbar Exercises  tandem standing kegel for count of 5 x 5 reps    Other Standing Lumbar Exercises  hip abduction x 10 each side      Lumbar Exercises: Seated   Sit to Stand  15 reps    Other Seated Lumbar Exercises  ball squeeze with kegel - TC to cue left instead of bulge - 20x               PT Short Term Goals - 12/25/19 1019      PT SHORT TERM GOAL #1   Title  pt will be ind with initial HEP    Status  On-going      PT SHORT TERM GOAL #2   Title  Pt will be get the urge to void before leaking at least 60% of the time    Baseline  can tell when she has to go every time    Status  Achieved        PT Long Term Goals - 12/25/19 1021      PT LONG TERM GOAL #1   Title  ind with advanced HEP      PT LONG TERM GOAL #2   Title  Pt will have nocturia of 1x or less due to improved pelvic muscle tone    Baseline  nocturia 2-4x    Status  On-going      PT LONG TERM GOAL #3   Title  Pt will be able to cough, sneeze or laugh without leakage    Baseline  did one time this week    Status  On-going      PT LONG TERM GOAL #4   Title  Pt will demonstrate imporved abdominal tone and be able to activate TrA during transfers such as supine to sit for reduced leakage    Status  On-going            Plan - 12/27/19 1452    Clinical Impression Statement  Pt and husband present throughout session.  Both report good change in leakage.  Pt able to progress to standing exercises today.  Pt declined use of biofeedback today due to feeling stiff and didn't want to undress.  Pt was cued with VC  to extend the kegel to a longer hold time.  Pt will continue to benefit from skilled PT to workon imporved circular contraction and endurance.    Comorbidities  Anxiety, Arthritis, Breast cancer ([place]) (08/08/13), Cataract, Chronic insomnia, Cluster headaches, Depression, Fibromyalgia, GERD (gastroesophageal reflux disease), H/O hiatal hernia, History of colonic polyps, History of transfusion (08/30/2013), radiation therapy (10/29/13- 12/14/13), Hypothyroidism, Irritable bowel syndrome, Kidney stone, Lymphedema, Macular degeneration, Osteopenia, Other abnormal glucose    PT Treatment/Interventions  ADLs/Self Care Home Management;Biofeedback;Cryotherapy;Electrical Stimulation;Moist Heat;Therapeutic activities;Therapeutic exercise;Neuromuscular re-education;Patient/family education;Manual techniques;Passive range of motion;Dry needling    PT Next Visit Plan  biofeedback, internal STM to stimulate circular contraction; progress core and pelvic floor strength and endurance    PT Home Exercise Plan  Access Code: [email or web address]    Consulted and Agree with Plan of Care  Patient;Family member/caregiver    Family Member Consulted  husband helps with all of her therapy due to her memory problems       Patient will benefit from skilled therapeutic intervention in order to improve the following deficits and impairments:  Abnormal gait, Decreased strength, Decreased coordination, Impaired tone, Postural dysfunction, Pain  Visit Diagnosis: Muscle weakness (generalized)  Abnormal posture  Unspecified lack of coordination     Problem List Patient Active Problem List   Diagnosis Date Noted  . MDD (major depressive disorder), recurrent, in full remission ([place]) 12/10/2019  . Pulmonary fibrosis ([place]) 12/04/2018  . MDD (major depressive disorder), recurrent episode, mild ([place]) 12/22/2017  . Osteoporosis 07/14/2017  . OA (osteoarthritis) of knee 11/03/2015  . Osteopenia 08/08/2015  . Headache 02/03/2015  . Atypical  chest pain 07/15/2014  . Arthralgia 02/22/2014  . Psoriasis 02/22/2014  . Malignant neoplasm of lower-outer quadrant of right breast of female, estrogen receptor positive ([place]) 08/09/2013  . Neck pain 06/22/2013  . Left knee pain 11/29/2012  . Reactive depression (situational) 04/26/2012  . Right wrist pain 02/02/2012  . Right foot pain 10/04/2011  . Nevus 06/05/2011  . Status post total knee replacement 04/06/2011  . Overactive bladder 03/14/2011  . KNEE PAIN, LEFT 08/31/2010  . HEARING LOSS 08/17/2010  . HIRSUTISM 06/16/2009  . CYST, IDIOPATHIC 05/20/2008  . IRRITABLE BOWEL SYNDROME 04/15/2008  . Allergic asthma, mild intermittent, uncomplicated [id number]  . GERD 03/19/2008  . COLONIC POLYPS, HX OF 03/19/2008  . NEPHROLITHIASIS, HX OF 03/19/2008  . Hypothyroidism 03/18/2008  . Hyperlipidemia 03/18/2008  . INSOMNIA, CHRONIC 03/18/2008  . FIBROMYALGIA 03/18/2008  . Prediabetes 03/18/2008    Jule Ser, PT 12/27/2019, 2:58 PM  [facility] [address] [place], [place], [address] Phone: [phone]   Fax:  [phone]  Name: Denym Ings MRN: [id number] Date of Birth: [birth year]

## 2020-01-01 ENCOUNTER — Ambulatory Visit: Payer: Medicare HMO | Admitting: Physical Therapy

## 2020-01-01 ENCOUNTER — Encounter: Payer: Self-pay | Admitting: Physical Therapy

## 2020-01-01 ENCOUNTER — Other Ambulatory Visit: Payer: Self-pay

## 2020-01-01 DIAGNOSIS — R279 Unspecified lack of coordination: Secondary | ICD-10-CM

## 2020-01-01 DIAGNOSIS — R293 Abnormal posture: Secondary | ICD-10-CM

## 2020-01-01 DIAGNOSIS — M6281 Muscle weakness (generalized): Secondary | ICD-10-CM | POA: Diagnosis not present

## 2020-01-01 NOTE — Therapy (Signed)
[facility] [address], [place], [place], [address] Phone: [phone]   Fax:  [phone]  Physical Therapy Treatment  Patient Details  Name: Jean Nash MRN: [id number] Date of Birth: [birth year] Referring Provider (PT): [name], NP   Encounter Date: 01/01/2020  PT End of Session - 01/01/20 1152    Visit Number  4    Date for PT Re-Evaluation  02/11/20    PT Start Time  [id number]    PT Stop Time  1232    PT Time Calculation (min)  44 min    Activity Tolerance  Patient tolerated treatment well    Behavior During Therapy  [facility] for tasks assessed/performed       Past Medical History:  Diagnosis Date  . Anxiety    PHOBIAS  . Arthritis   . Breast cancer ([place]) 08/08/13   right LOQ  . Cataract   . Chronic insomnia   . Cluster headaches    history of migraines / NONE FOR SEVERAL YRS  . Depression   . Fibromyalgia   . GERD (gastroesophageal reflux disease)   . H/O hiatal hernia   . History of colonic polyps   . History of transfusion 08/30/2013  . Hx of radiation therapy 10/29/13- 12/14/13   right chest wall 5040 cGy 28 sessions, right supraclavicular/axillary region 5040 cGy 28 sessions, right chest wall boost 1000 cGy 5 sessions  . Hypothyroidism   . Irritable bowel syndrome   . Kidney stone   . Lymphedema    RT ARM - WEARS SLEEVE  . Macular degeneration    hole/right eye  . Osteopenia   . Other abnormal glucose   . Other and unspecified hyperlipidemia   . Pain in joint, shoulder region   . Pneumonia [id number]  . Sleep apnea    USES C-PAP  . Stress incontinence, female     Past Surgical History:  Procedure Laterality Date  . ABDOMINAL HYSTERECTOMY    . APPENDECTOMY    . BILATERAL TOTAL MASTECTOMY WITH AXILLARY LYMPH NODE DISSECTION  08/30/2013   Dr Barry Dienes  . BREAST CYST ASPIRATION     9 cysts  . CATARACT EXTRACTION, BILATERAL  2005/2007  . CHOLECYSTECTOMY    . COLONOSCOPY    . CYSTOCELE  REPAIR    . EVACUATION BREAST HEMATOMA Left 08/31/2013   Procedure: EVACUATION HEMATOMA BREAST;  Surgeon: Stark Klein, MD;  Location: [place];  Service: General;  Laterality: Left;  . EYE SURGERY     to repair macular hole  . FOOT ARTHROPLASTY     lt   . GANGLION CYST EXCISION     rt foot  . HEMORRHOID SURGERY     03/1993  . JOINT REPLACEMENT  03/15/11   left knee replacement  . KNEE ARTHROSCOPY     /partial knee 2016/left knee 2012  . MASS EXCISION  11/04/2011   Procedure: EXCISION MASS;  Surgeon: Cammie Sickle., MD;  Location: [place];  Service: Orthopedics;  Laterality: Right;  excisional biopsy right ulna mass  . MASTECTOMY W/ SENTINEL NODE BIOPSY Right 08/30/2013   Procedure: RIGHT  AXILLARY SENTINEL LYMPH NODE BIOPSY; Right Axillary Node Disection;  Surgeon: Stark Klein, MD;  Location: [place];  Service: General;  Laterality: Right;  Right side nuc med 7:00   . PARTIAL KNEE ARTHROPLASTY Right 11/03/2015   Procedure: RIGHT KNEE MEDIAL UNICOMPARTMENTAL ARTHROPLASTY ;  Surgeon: Gaynelle Arabian, MD;  [facility];  Service: Orthopedics;  Laterality:  Right;  Marland Kitchen RECTOCELE REPAIR    . SIMPLE MASTECTOMY WITH AXILLARY SENTINEL NODE BIOPSY Left 08/30/2013   Procedure: Bilateral Breast Mastectomy ;  Surgeon: Stark Klein, MD;  Location: [place];  Service: General;  Laterality: Left;  . skin tags removed     breast, panty line, neckline  . TOE SURGERY     preventative crossover toe surg/right foot  . TOE SURGERY  2009   left foot/screw  in 2nd toe  . TONSILLECTOMY    . UPPER GASTROINTESTINAL ENDOSCOPY      There were no vitals filed for this visit.  Subjective Assessment - 01/01/20 1150    Subjective  My back and hip are hurting very bad today on the Rt side.  Had one day that I woke up with a wet pad . Other than that very good.    Patient is accompained by:  Family member    Patient Stated Goals  leakage decreased and reduced frequency    Currently in Pain?   No/denies                  [facility]  Lymphedema Life Impact Scale Total Score  27.94 %           [facility] Adult PT Treatment/Exercise - 01/01/20 0001      Neuro Re-ed    Neuro Re-ed Details   biofeedback used throughout: up to 10-15 mV and hold for 2-3 sec; returns to resting tone in 5 seconds and does better with 8 sec rest between reps      Lumbar Exercises: Standing   Other Standing Lumbar Exercises  weight shifting and single leg standing with kegel      Lumbar Exercises: Seated   Other Seated Lumbar Exercises  ball squeeze with kegel - TC to cue left instead of bulge - 20x      Lumbar Exercises: Supine   Other Supine Lumbar Exercises  hip IR; bent knee drop out; ball squeeze; hooklying - 2x10 with 3 sec hold       Manual Therapy   Manual therapy comments  sidelying traction and lumbar decompression;                PT Short Term Goals - 12/25/19 1019      PT SHORT TERM GOAL #1   Title  pt will be ind with initial HEP    Status  On-going      PT SHORT TERM GOAL #2   Title  Pt will be get the urge to void before leaking at least 60% of the time    Baseline  can tell when she has to go every time    Status  Achieved        PT Long Term Goals - 12/25/19 1021      PT LONG TERM GOAL #1   Title  ind with advanced HEP      PT LONG TERM GOAL #2   Title  Pt will have nocturia of 1x or less due to improved pelvic muscle tone    Baseline  nocturia 2-4x    Status  On-going      PT LONG TERM GOAL #3   Title  Pt will be able to cough, sneeze or laugh without leakage    Baseline  did one time this week    Status  On-going      PT LONG TERM GOAL #4   Title  Pt will demonstrate imporved abdominal tone and be able to activate TrA during transfers such as supine to sit for reduced leakage    Status  On-going            Plan - 01/01/20 1226    Clinical Impression Statement   Pt did well with biofeedback and found it helpful to see what she was doing.  She is able to sustain the contraction for 3 seconds before gradually slipping back to resting tone. Pt attempting different positions and co-contraction and she did well recruiting muscles in standing and using a ball seated and sidelying.  Pt had tension and pain in her low back when she came in but resolved with lumbar decompression stretch using PT overpressure.  Pt will benefit from skilled PT to continue to work on core and pelvic floor strength.    PT Treatment/Interventions  ADLs/Self Care Home Management;Biofeedback;Cryotherapy;Electrical Stimulation;Moist Heat;Therapeutic activities;Therapeutic exercise;Neuromuscular re-education;Patient/family education;Manual techniques;Passive range of motion;Dry needling    PT Next Visit Plan  biofeedback, internal STM to stimulate circular contraction; progress core and pelvic floor strength and endurance    PT Home Exercise Plan  Access Code: [email or web address]    Consulted and Agree with Plan of Care  Patient;Family member/caregiver    Family Member Consulted  husband helps with all of her therapy due to her memory problems       Patient will benefit from skilled therapeutic intervention in order to improve the following deficits and impairments:  Abnormal gait, Decreased strength, Decreased coordination, Impaired tone, Postural dysfunction, Pain  Visit Diagnosis: Muscle weakness (generalized)  Abnormal posture  Unspecified lack of coordination     Problem List Patient Active Problem List   Diagnosis Date Noted  . MDD (major depressive disorder), recurrent, in full remission ([place]) 12/10/2019  . Pulmonary fibrosis ([place]) 12/04/2018  . MDD (major depressive disorder), recurrent episode, mild ([place]) 12/22/2017  . Osteoporosis 07/14/2017  . OA (osteoarthritis) of knee 11/03/2015  . Osteopenia 08/08/2015  . Headache 02/03/2015  . Atypical chest pain 07/15/2014  . Arthralgia  02/22/2014  . Psoriasis 02/22/2014  . Malignant neoplasm of lower-outer quadrant of right breast of female, estrogen receptor positive ([place]) 08/09/2013  . Neck pain 06/22/2013  . Left knee pain 11/29/2012  . Reactive depression (situational) 04/26/2012  . Right wrist pain 02/02/2012  . Right foot pain 10/04/2011  . Nevus 06/05/2011  . Status post total knee replacement 04/06/2011  . Overactive bladder 03/14/2011  . KNEE PAIN, LEFT 08/31/2010  . HEARING LOSS 08/17/2010  . HIRSUTISM 06/16/2009  . CYST, IDIOPATHIC 05/20/2008  . IRRITABLE BOWEL SYNDROME 04/15/2008  . Allergic asthma, mild intermittent, uncomplicated [id number]  . GERD 03/19/2008  . COLONIC POLYPS, HX OF 03/19/2008  . NEPHROLITHIASIS, HX OF 03/19/2008  . Hypothyroidism 03/18/2008  . Hyperlipidemia 03/18/2008  . INSOMNIA, CHRONIC 03/18/2008  . FIBROMYALGIA 03/18/2008  . Prediabetes 03/18/2008    Jule Ser, PT 01/01/2020, 2:26 PM  [facility] [address] [place], [place], [address] Phone: [phone]   Fax:  [phone]  Name: Quantisha Majeed MRN: [id number] Date of Birth: [birth year]

## 2020-01-04 ENCOUNTER — Ambulatory Visit: Payer: Medicare HMO | Admitting: Physical Therapy

## 2020-01-04 ENCOUNTER — Encounter: Payer: Self-pay | Admitting: Physical Therapy

## 2020-01-04 ENCOUNTER — Other Ambulatory Visit: Payer: Self-pay

## 2020-01-04 DIAGNOSIS — M6281 Muscle weakness (generalized): Secondary | ICD-10-CM

## 2020-01-04 DIAGNOSIS — R293 Abnormal posture: Secondary | ICD-10-CM

## 2020-01-04 DIAGNOSIS — R279 Unspecified lack of coordination: Secondary | ICD-10-CM

## 2020-01-04 NOTE — Therapy (Signed)
[facility] [address], [place], [place], [address] Phone: [phone]   Fax:  [phone]  Physical Therapy Treatment  Patient Details  Name: Jean Nash MRN: [id number] Date of Birth: 19-Jun-1941 Referring Provider (PT): [name], NP   Encounter Date: 01/04/2020  PT End of Session - 01/04/20 1053    Visit Number  5    Date for PT Re-Evaluation  02/11/20    PT Start Time  [id number]    PT Stop Time  1055    PT Time Calculation (min)  40 min    Activity Tolerance  Patient tolerated treatment well    Behavior During Therapy  [facility] for tasks assessed/performed       Past Medical History:  Diagnosis Date  . Anxiety    PHOBIAS  . Arthritis   . Breast cancer ([place]) 08/08/13   right LOQ  . Cataract   . Chronic insomnia   . Cluster headaches    history of migraines / NONE FOR SEVERAL YRS  . Depression   . Fibromyalgia   . GERD (gastroesophageal reflux disease)   . H/O hiatal hernia   . History of colonic polyps   . History of transfusion 08/30/2013  . Hx of radiation therapy 10/29/13- 12/14/13   right chest wall 5040 cGy 28 sessions, right supraclavicular/axillary region 5040 cGy 28 sessions, right chest wall boost 1000 cGy 5 sessions  . Hypothyroidism   . Irritable bowel syndrome   . Kidney stone   . Lymphedema    RT ARM - WEARS SLEEVE  . Macular degeneration    hole/right eye  . Osteopenia   . Other abnormal glucose   . Other and unspecified hyperlipidemia   . Pain in joint, shoulder region   . Pneumonia [id number]  . Sleep apnea    USES C-PAP  . Stress incontinence, female     Past Surgical History:  Procedure Laterality Date  . ABDOMINAL HYSTERECTOMY    . APPENDECTOMY    . BILATERAL TOTAL MASTECTOMY WITH AXILLARY LYMPH NODE DISSECTION  08/30/2013   Dr Barry Dienes  . BREAST CYST ASPIRATION     9 cysts  . CATARACT EXTRACTION, BILATERAL  2005/2007  . CHOLECYSTECTOMY    . COLONOSCOPY    . CYSTOCELE  REPAIR    . EVACUATION BREAST HEMATOMA Left 08/31/2013   Procedure: EVACUATION HEMATOMA BREAST;  Surgeon: Stark Klein, MD;  Location: [place];  Service: General;  Laterality: Left;  . EYE SURGERY     to repair macular hole  . FOOT ARTHROPLASTY     lt   . GANGLION CYST EXCISION     rt foot  . HEMORRHOID SURGERY     03/1993  . JOINT REPLACEMENT  03/15/11   left knee replacement  . KNEE ARTHROSCOPY     /partial knee 2016/left knee 2012  . MASS EXCISION  11/04/2011   Procedure: EXCISION MASS;  Surgeon: Cammie Sickle., MD;  Location: [place];  Service: Orthopedics;  Laterality: Right;  excisional biopsy right ulna mass  . MASTECTOMY W/ SENTINEL NODE BIOPSY Right 08/30/2013   Procedure: RIGHT  AXILLARY SENTINEL LYMPH NODE BIOPSY; Right Axillary Node Disection;  Surgeon: Stark Klein, MD;  Location: [place];  Service: General;  Laterality: Right;  Right side nuc med 7:00   . PARTIAL KNEE ARTHROPLASTY Right 11/03/2015   Procedure: RIGHT KNEE MEDIAL UNICOMPARTMENTAL ARTHROPLASTY ;  Surgeon: Gaynelle Arabian, MD;  [facility];  Service: Orthopedics;  Laterality:  Right;  Marland Kitchen RECTOCELE REPAIR    . SIMPLE MASTECTOMY WITH AXILLARY SENTINEL NODE BIOPSY Left 08/30/2013   Procedure: Bilateral Breast Mastectomy ;  Surgeon: Stark Klein, MD;  Location: [place];  Service: General;  Laterality: Left;  . skin tags removed     breast, panty line, neckline  . TOE SURGERY     preventative crossover toe surg/right foot  . TOE SURGERY  2009   left foot/screw  in 2nd toe  . TONSILLECTOMY    . UPPER GASTROINTESTINAL ENDOSCOPY      There were no vitals filed for this visit.  Subjective Assessment - 01/04/20 1021    Subjective  I did really well with my exercises and did all of them.    Patient is accompained by:  Family member    Patient Stated Goals  leakage decreased and reduced frequency    Currently in Pain?  No/denies                  [facility]patient [facility]/2018 [facility]  Lymphedema Life Impact Scale Total Score  27.94 %           [facility] Adult PT Treatment/Exercise - 01/04/20 0001      Lumbar Exercises: Aerobic   Nustep  L3 x 6 min - PT present for status update      Lumbar Exercises: Standing   Row  Strengthening;Both;20 reps;Theraband    Theraband Level (Row)  Level 3 (Green)    Shoulder Extension  Strengthening;Both;Theraband;20 reps    Theraband Level (Shoulder Extension)  Level 2 (Red)    Other Standing Lumbar Exercises  marching and hip abduction with kegel at stairs    Other Standing Lumbar Exercises  red band isometric at sides stepping forward for TrA activation      Lumbar Exercises: Supine   Other Supine Lumbar Exercises  bridge with ball squeeze; ball overhead 20x each               PT Short Term Goals - 12/25/19 1019      PT SHORT TERM GOAL #1   Title  pt will be ind with initial HEP    Status  On-going      PT SHORT TERM GOAL #2   Title  Pt will be get the urge to void before leaking at least 60% of the time    Baseline  can tell when she has to go every time    Status  Achieved        PT Long Term Goals - 01/04/20 1027      PT LONG TERM GOAL #1   Title  ind with advanced HEP    Status  On-going      PT LONG TERM GOAL #2   Title  Pt will have nocturia of 1x or less due to improved pelvic muscle tone    Status  On-going      PT LONG TERM GOAL #3   Title  Pt will be able to cough, sneeze or laugh without leakage    Status  On-going      PT LONG TERM GOAL #4   Title  Pt will demonstrate imporved abdominal tone and be able to activate TrA during transfers such as supine to sit for reduced leakage            Plan - 01/04/20 1115    Clinical Impression Statement  Pt is doing well with exercises at home.  She was able to progress standing exercises and does well standing single leg without UE support.  Pt was standing and walking more upright and had no back  pain today as well since she has been working on lumbar stretches and husband has helped  to traction the low back.  Pt will benefit from skilled PT to continue to work on improved pelvic floor strength and endurance for reduced leakage and nocturia.    PT Treatment/Interventions  ADLs/Self Care Home Management;Biofeedback;Cryotherapy;Electrical Stimulation;Moist Heat;Therapeutic activities;Therapeutic exercise;Neuromuscular re-education;Patient/family education;Manual techniques;Passive range of motion;Dry needling    PT Next Visit Plan  progress core and standing exercises; pelvic floor strength and endurance    PT Home Exercise Plan  Access Code: [email or web address]    Consulted and Agree with Plan of Care  Patient;Family member/caregiver    Family Member Consulted  husband helps with all of her therapy due to her memory problems       Patient will benefit from skilled therapeutic intervention in order to improve the following deficits and impairments:  Abnormal gait, Decreased strength, Decreased coordination, Impaired tone, Postural dysfunction, Pain  Visit Diagnosis: Muscle weakness (generalized)  Abnormal posture  Unspecified lack of coordination     Problem List Patient Active Problem List   Diagnosis Date Noted  . MDD (major depressive disorder), recurrent, in full remission ([place]) 12/10/2019  . Pulmonary fibrosis ([place]) 12/04/2018  . MDD (major depressive disorder), recurrent episode, mild ([place]) 12/22/2017  . Osteoporosis 07/14/2017  . OA (osteoarthritis) of knee 11/03/2015  . Osteopenia 08/08/2015  . Headache 02/03/2015  . Atypical chest pain 07/15/2014  . Arthralgia 02/22/2014  . Psoriasis 02/22/2014  . Malignant neoplasm of lower-outer quadrant of right breast of female, estrogen receptor positive ([place]) 08/09/2013  . Neck pain 06/22/2013  . Left knee pain 11/29/2012  . Reactive depression (situational) 04/26/2012  . Right wrist pain 02/02/2012  . Right foot pain 10/04/2011  .  Nevus 06/05/2011  . Status post total knee replacement 04/06/2011  . Overactive bladder 03/14/2011  . KNEE PAIN, LEFT 08/31/2010  . HEARING LOSS 08/17/2010  . HIRSUTISM 06/16/2009  . CYST, IDIOPATHIC 05/20/2008  . IRRITABLE BOWEL SYNDROME 04/15/2008  . Allergic asthma, mild intermittent, uncomplicated [id number]  . GERD 03/19/2008  . COLONIC POLYPS, HX OF 03/19/2008  . NEPHROLITHIASIS, HX OF 03/19/2008  . Hypothyroidism 03/18/2008  . Hyperlipidemia 03/18/2008  . INSOMNIA, CHRONIC 03/18/2008  . FIBROMYALGIA 03/18/2008  . Prediabetes 03/18/2008    Jule Ser, PT 01/04/2020, 1:01 PM  [facility] [address] [place], [place], [address] Phone: [phone]   Fax:  [phone]  Name: Kanise Glace MRN: [id number] Date of Birth: [birth year]

## 2020-01-08 ENCOUNTER — Ambulatory Visit: Payer: Medicare HMO | Admitting: Physical Therapy

## 2020-01-08 ENCOUNTER — Other Ambulatory Visit: Payer: Self-pay

## 2020-01-08 DIAGNOSIS — M6281 Muscle weakness (generalized): Secondary | ICD-10-CM

## 2020-01-08 DIAGNOSIS — R293 Abnormal posture: Secondary | ICD-10-CM

## 2020-01-08 DIAGNOSIS — R279 Unspecified lack of coordination: Secondary | ICD-10-CM

## 2020-01-08 NOTE — Therapy (Signed)
[facility] [address] [place], [place], [address] Phone: [phone]   Fax:  [phone]  Physical Therapy Treatment  Patient Details  Name: Jean Nash MRN: [id number] Date of Birth: 12/06/1940 Referring Provider (PT): [name], NP   Encounter Date: 01/08/2020  PT End of Session - 01/08/20 1151    Visit Number  6    Date for PT Re-Evaluation  02/11/20    PT Start Time  [id number]    PT Stop Time  1228    PT Time Calculation (min)  40 min    Activity Tolerance  Patient tolerated treatment well    Behavior During Therapy  [facility] for tasks assessed/performed       Past Medical History:  Diagnosis Date  . Anxiety    PHOBIAS  . Arthritis   . Breast cancer ([place]) 08/08/13   right LOQ  . Cataract   . Chronic insomnia   . Cluster headaches    history of migraines / NONE FOR SEVERAL YRS  . Depression   . Fibromyalgia   . GERD (gastroesophageal reflux disease)   . H/O hiatal hernia   . History of colonic polyps   . History of transfusion 08/30/2013  . Hx of radiation therapy 10/29/13- 12/14/13   right chest wall 5040 cGy 28 sessions, right supraclavicular/axillary region 5040 cGy 28 sessions, right chest wall boost 1000 cGy 5 sessions  . Hypothyroidism   . Irritable bowel syndrome   . Kidney stone   . Lymphedema    RT ARM - WEARS SLEEVE  . Macular degeneration    hole/right eye  . Osteopenia   . Other abnormal glucose   . Other and unspecified hyperlipidemia   . Pain in joint, shoulder region   . Pneumonia [id number]  . Sleep apnea    USES C-PAP  . Stress incontinence, female     Past Surgical History:  Procedure Laterality Date  . ABDOMINAL HYSTERECTOMY    . APPENDECTOMY    . BILATERAL TOTAL MASTECTOMY WITH AXILLARY LYMPH NODE DISSECTION  08/30/2013   Dr Barry Dienes  . BREAST CYST ASPIRATION     9 cysts  . CATARACT EXTRACTION, BILATERAL  2005/2007  . CHOLECYSTECTOMY    . COLONOSCOPY    . CYSTOCELE  REPAIR    . EVACUATION BREAST HEMATOMA Left 08/31/2013   Procedure: EVACUATION HEMATOMA BREAST;  Surgeon: Stark Klein, MD;  Location: [place];  Service: General;  Laterality: Left;  . EYE SURGERY     to repair macular hole  . FOOT ARTHROPLASTY     lt   . GANGLION CYST EXCISION     rt foot  . HEMORRHOID SURGERY     03/1993  . JOINT REPLACEMENT  03/15/11   left knee replacement  . KNEE ARTHROSCOPY     /partial knee 2016/left knee 2012  . MASS EXCISION  11/04/2011   Procedure: EXCISION MASS;  Surgeon: Cammie Sickle., MD;  Location: [place];  Service: Orthopedics;  Laterality: Right;  excisional biopsy right ulna mass  . MASTECTOMY W/ SENTINEL NODE BIOPSY Right 08/30/2013   Procedure: RIGHT  AXILLARY SENTINEL LYMPH NODE BIOPSY; Right Axillary Node Disection;  Surgeon: Stark Klein, MD;  Location: [place];  Service: General;  Laterality: Right;  Right side nuc med 7:00   . PARTIAL KNEE ARTHROPLASTY Right 11/03/2015   Procedure: RIGHT KNEE MEDIAL UNICOMPARTMENTAL ARTHROPLASTY ;  Surgeon: Gaynelle Arabian, MD;  [facility];  Service: Orthopedics;  Laterality:  Right;  Marland Kitchen RECTOCELE REPAIR    . SIMPLE MASTECTOMY WITH AXILLARY SENTINEL NODE BIOPSY Left 08/30/2013   Procedure: Bilateral Breast Mastectomy ;  Surgeon: Stark Klein, MD;  Location: [facility];  Service: General;  Laterality: Left;  . skin tags removed     breast, panty line, neckline  . TOE SURGERY     preventative crossover toe surg/right foot  . TOE SURGERY  2009   left foot/screw  in 2nd toe  . TONSILLECTOMY    . UPPER GASTROINTESTINAL ENDOSCOPY      There were no vitals filed for this visit.  Subjective Assessment - 01/08/20 1152    Subjective  I am sore all over, my back and legs and hand.  One of the days we were hanging curtains    Patient is accompained by:  Family member    Patient Stated Goals  leakage decreased and reduced frequency    Currently in Pain?  Yes    Pain Score  8     Pain Location   Back    Pain Orientation  Right    Pain Descriptors / Indicators  Aching    Pain Type  Chronic pain    Aggravating Factors   not sure what made it hurt    Pain Relieving Factors  sitting is not as bad    Multiple Pain Sites  No                  Outpatient Rehab from 08/24/2018 in [place]  Lymphedema Life Impact Scale Total Score  27.94 %           [facility] Adult PT Treatment/Exercise - 01/08/20 0001      Lumbar Exercises: Aerobic   Nustep  L3 x 8 min - PT present for status update      Lumbar Exercises: Supine   Pelvic Tilt  20 reps    Bent Knee Raise  20 reps    Large Ball Abdominal Isometric  20 reps    Large Ball Abdominal Isometric Limitations  roll out and ball raises    Large Ball Oblique Isometric Limitations  ball rotation side to side    Other Supine Lumbar Exercises  bilat knee drop out                PT Short Term Goals - 12/25/19 1019      PT SHORT TERM GOAL #1   Title  pt will be ind with initial HEP    Status  On-going      PT SHORT TERM GOAL #2   Title  Pt will be get the urge to void before leaking at least 60% of the time    Baseline  can tell when she has to go every time    Status  Achieved        PT Long Term Goals - 01/04/20 1027      PT LONG TERM GOAL #1   Title  ind with advanced HEP    Status  On-going      PT LONG TERM GOAL #2   Title  Pt will have nocturia of 1x or less due to improved pelvic muscle tone    Status  On-going      PT LONG TERM GOAL #3   Title  Pt will be able to cough, sneeze or laugh without leakage    Status  On-going      PT LONG TERM GOAL #4  Title  Pt will demonstrate imporved abdominal tone and be able to activate TrA during transfers such as supine to sit for reduced leakage            Plan - 01/08/20 1233    Clinical Impression Statement  Pt was having back pain today so session focused on core strength in positions that were tolerable for  her.  pt responded well to supine with moist heat behind low back.  pt was given updates to HEP and educated on making sure she is not tense in her low back when doing the exercises at home.    Comorbidities  Anxiety, Arthritis, Breast cancer ([place]) (08/08/13), Cataract, Chronic insomnia, Cluster headaches, Depression, Fibromyalgia, GERD (gastroesophageal reflux disease), H/O hiatal hernia, History of colonic polyps, History of transfusion (08/30/2013), radiation therapy (10/29/13- 12/14/13), Hypothyroidism, Irritable bowel syndrome, Kidney stone, Lymphedema, Macular degeneration, Osteopenia, Other abnormal glucose    PT Treatment/Interventions  ADLs/Self Care Home Management;Biofeedback;Cryotherapy;Electrical Stimulation;Moist Heat;Therapeutic activities;Therapeutic exercise;Neuromuscular re-education;Patient/family education;Manual techniques;Passive range of motion;Dry needling    PT Next Visit Plan  progress core and standing exercises; pelvic floor strength and endurance    PT Home Exercise Plan  Access Code: [email or web address]    Consulted and Agree with Plan of Care  Patient;Family member/caregiver    Family Member Consulted  husband helps with all of her therapy due to her memory problems       Patient will benefit from skilled therapeutic intervention in order to improve the following deficits and impairments:  Abnormal gait, Decreased strength, Decreased coordination, Impaired tone, Postural dysfunction, Pain  Visit Diagnosis: Muscle weakness (generalized)  Abnormal posture  Unspecified lack of coordination     Problem List Patient Active Problem List   Diagnosis Date Noted  . MDD (major depressive disorder), recurrent, in full remission ([place]) 12/10/2019  . Pulmonary fibrosis ([place]) 12/04/2018  . MDD (major depressive disorder), recurrent episode, mild ([place]) 12/22/2017  . Osteoporosis 07/14/2017  . OA (osteoarthritis) of knee 11/03/2015  . Osteopenia 08/08/2015  . Headache 02/03/2015  .  Atypical chest pain 07/15/2014  . Arthralgia 02/22/2014  . Psoriasis 02/22/2014  . Malignant neoplasm of lower-outer quadrant of right breast of female, estrogen receptor positive ([place]) 08/09/2013  . Neck pain 06/22/2013  . Left knee pain 11/29/2012  . Reactive depression (situational) 04/26/2012  . Right wrist pain 02/02/2012  . Right foot pain 10/04/2011  . Nevus 06/05/2011  . Status post total knee replacement 04/06/2011  . Overactive bladder 03/14/2011  . KNEE PAIN, LEFT 08/31/2010  . HEARING LOSS 08/17/2010  . HIRSUTISM 06/16/2009  . CYST, IDIOPATHIC 05/20/2008  . IRRITABLE BOWEL SYNDROME 04/15/2008  . Allergic asthma, mild intermittent, uncomplicated [id number]  . GERD 03/19/2008  . COLONIC POLYPS, HX OF 03/19/2008  . NEPHROLITHIASIS, HX OF 03/19/2008  . Hypothyroidism 03/18/2008  . Hyperlipidemia 03/18/2008  . INSOMNIA, CHRONIC 03/18/2008  . FIBROMYALGIA 03/18/2008  . Prediabetes 03/18/2008    Jule Ser, PT 01/08/2020, 2:09 PM  [facility] [address], [place], [place], [address] Phone: [phone]   Fax:  [phone]  Name: Jean Nash MRN: [id number] Date of Birth: Jul 16, 1941

## 2020-01-09 ENCOUNTER — Encounter: Payer: Self-pay | Admitting: Adult Health

## 2020-01-09 DIAGNOSIS — G629 Polyneuropathy, unspecified: Secondary | ICD-10-CM

## 2020-01-09 DIAGNOSIS — K219 Gastro-esophageal reflux disease without esophagitis: Secondary | ICD-10-CM

## 2020-01-10 ENCOUNTER — Other Ambulatory Visit: Payer: Self-pay

## 2020-01-10 ENCOUNTER — Other Ambulatory Visit: Payer: Self-pay | Admitting: Family Medicine

## 2020-01-10 ENCOUNTER — Ambulatory Visit: Payer: Medicare HMO | Admitting: Physical Therapy

## 2020-01-10 ENCOUNTER — Encounter: Payer: Self-pay | Admitting: Physical Therapy

## 2020-01-10 ENCOUNTER — Telehealth: Payer: Self-pay | Admitting: Adult Health

## 2020-01-10 DIAGNOSIS — R293 Abnormal posture: Secondary | ICD-10-CM

## 2020-01-10 DIAGNOSIS — G629 Polyneuropathy, unspecified: Secondary | ICD-10-CM

## 2020-01-10 DIAGNOSIS — R279 Unspecified lack of coordination: Secondary | ICD-10-CM

## 2020-01-10 DIAGNOSIS — K219 Gastro-esophageal reflux disease without esophagitis: Secondary | ICD-10-CM

## 2020-01-10 DIAGNOSIS — M6281 Muscle weakness (generalized): Secondary | ICD-10-CM

## 2020-01-10 MED ORDER — PREGABALIN 150 MG PO CAPS: 150.0000 mg | ORAL_CAPSULE | Freq: Two times a day (BID) | ORAL | 2 refills | Status: DC

## 2020-01-10 MED ORDER — PREGABALIN 150 MG PO CAPS: 150.0000 mg | ORAL_CAPSULE | Freq: Two times a day (BID) | ORAL | 1 refills | Status: DC

## 2020-01-10 MED ORDER — ESOMEPRAZOLE MAGNESIUM 40 MG PO CPDR: DELAYED_RELEASE_CAPSULE | ORAL | 0 refills | Status: DC

## 2020-01-10 MED ORDER — ESOMEPRAZOLE MAGNESIUM 40 MG PO CPDR: DELAYED_RELEASE_CAPSULE | ORAL | 2 refills | Status: DC

## 2020-01-10 NOTE — Telephone Encounter (Signed)
Prescriptions has been picked up at the front desk.  Nothing further needed.

## 2020-01-10 NOTE — Therapy (Signed)
[facility] [address], [place], [place], [address] Phone: [phone]   Fax:  [phone]  Physical Therapy Treatment  Patient Details  Name: Jean Nash MRN: [id number] Date of Birth: July 16, 1941 Referring Provider (PT): [name], NP   Encounter Date: 01/10/2020  PT End of Session - 01/10/20 1152    Visit Number  7    Date for PT Re-Evaluation  02/11/20    PT Start Time  1148    PT Stop Time  1228    PT Time Calculation (min)  40 min    Activity Tolerance  Patient tolerated treatment well    Behavior During Therapy  [facility] for tasks assessed/performed       Past Medical History:  Diagnosis Date  . Anxiety    PHOBIAS  . Arthritis   . Breast cancer ([place]) 08/08/13   right LOQ  . Cataract   . Chronic insomnia   . Cluster headaches    history of migraines / NONE FOR SEVERAL YRS  . Depression   . Fibromyalgia   . GERD (gastroesophageal reflux disease)   . H/O hiatal hernia   . History of colonic polyps   . History of transfusion 08/30/2013  . Hx of radiation therapy 10/29/13- 12/14/13   right chest wall 5040 cGy 28 sessions, right supraclavicular/axillary region 5040 cGy 28 sessions, right chest wall boost 1000 cGy 5 sessions  . Hypothyroidism   . Irritable bowel syndrome   . Kidney stone   . Lymphedema    RT ARM - WEARS SLEEVE  . Macular degeneration    hole/right eye  . Osteopenia   . Other abnormal glucose   . Other and unspecified hyperlipidemia   . Pain in joint, shoulder region   . Pneumonia [id number]  . Sleep apnea    USES C-PAP  . Stress incontinence, female     Past Surgical History:  Procedure Laterality Date  . ABDOMINAL HYSTERECTOMY    . APPENDECTOMY    . BILATERAL TOTAL MASTECTOMY WITH AXILLARY LYMPH NODE DISSECTION  08/30/2013   Dr Barry Dienes  . BREAST CYST ASPIRATION     9 cysts  . CATARACT EXTRACTION, BILATERAL  2005/2007  . CHOLECYSTECTOMY    . COLONOSCOPY    . CYSTOCELE  REPAIR    . EVACUATION BREAST HEMATOMA Left 08/31/2013   Procedure: EVACUATION HEMATOMA BREAST;  Surgeon: Stark Klein, MD;  Location: [place];  Service: General;  Laterality: Left;  . EYE SURGERY     to repair macular hole  . FOOT ARTHROPLASTY     lt   . GANGLION CYST EXCISION     rt foot  . HEMORRHOID SURGERY     03/1993  . JOINT REPLACEMENT  03/15/11   left knee replacement  . KNEE ARTHROSCOPY     /partial knee 2016/left knee 2012  . MASS EXCISION  11/04/2011   Procedure: EXCISION MASS;  Surgeon: Cammie Sickle., MD;  Location: [place];  Service: Orthopedics;  Laterality: Right;  excisional biopsy right ulna mass  . MASTECTOMY W/ SENTINEL NODE BIOPSY Right 08/30/2013   Procedure: RIGHT  AXILLARY SENTINEL LYMPH NODE BIOPSY; Right Axillary Node Disection;  Surgeon: Stark Klein, MD;  Location: [place];  Service: General;  Laterality: Right;  Right side nuc med 7:00   . PARTIAL KNEE ARTHROPLASTY Right 11/03/2015   Procedure: RIGHT KNEE MEDIAL UNICOMPARTMENTAL ARTHROPLASTY ;  Surgeon: Gaynelle Arabian, MD;  [facility];  Service: Orthopedics;  Laterality:  Right;  Marland Kitchen RECTOCELE REPAIR    . SIMPLE MASTECTOMY WITH AXILLARY SENTINEL NODE BIOPSY Left 08/30/2013   Procedure: Bilateral Breast Mastectomy ;  Surgeon: Stark Klein, MD;  Location: [place];  Service: General;  Laterality: Left;  . skin tags removed     breast, panty line, neckline  . TOE SURGERY     preventative crossover toe surg/right foot  . TOE SURGERY  2009   left foot/screw  in 2nd toe  . TONSILLECTOMY    . UPPER GASTROINTESTINAL ENDOSCOPY      There were no vitals filed for this visit.  Subjective Assessment - 01/10/20 1151    Subjective  Pt states she is feeling better than last visit.    Patient Stated Goals  leakage decreased and reduced frequency                  Outpatient Rehab from 08/24/2018 in [place]  Lymphedema Life Impact Scale Total Score   27.94 %           [facility] Adult PT Treatment/Exercise - 01/10/20 0001      Neuro Re-ed    Neuro Re-ed Details   cues to activate TrA throughout session      Lumbar Exercises: Aerobic   Nustep  L3 x 10 min - PT present for status update      Lumbar Exercises: Standing   Other Standing Lumbar Exercises  band pull both ways for TrA red band- 10x    Other Standing Lumbar Exercises  standing pelvic tilts; sliders side and back - 15x each             PT Education - 01/10/20 1231    Education Details  Access Code: [email or web address]    Person(s) Educated  Patient;Spouse    Methods  Explanation;Demonstration;Tactile cues;Verbal cues;Handout    Comprehension  Verbalized understanding;Returned demonstration       PT Short Term Goals - 12/25/19 1019      PT SHORT TERM GOAL #1   Title  pt will be ind with initial HEP    Status  On-going      PT SHORT TERM GOAL #2   Title  Pt will be get the urge to void before leaking at least 60% of the time    Baseline  can tell when she has to go every time    Status  Achieved        PT Long Term Goals - 01/04/20 1027      PT LONG TERM GOAL #1   Title  ind with advanced HEP    Status  On-going      PT LONG TERM GOAL #2   Title  Pt will have nocturia of 1x or less due to improved pelvic muscle tone    Status  On-going      PT LONG TERM GOAL #3   Title  Pt will be able to cough, sneeze or laugh without leakage    Status  On-going      PT LONG TERM GOAL #4   Title  Pt will demonstrate imporved abdominal tone and be able to activate TrA during transfers such as supine to sit for reduced leakage            Plan - 01/10/20 1309    Clinical Impression Statement  Pt did well today.  She needs heavy VC and TC for pelvic tilt in standing, but is able to do correctly and her husband is  educated on how to help.  Pt will benefit from skilled PT to continue working on improved core strength and posture for reduced pain and improved pelvic  floor function.    PT Treatment/Interventions  ADLs/Self Care Home Management;Biofeedback;Cryotherapy;Electrical Stimulation;Moist Heat;Therapeutic activities;Therapeutic exercise;Neuromuscular re-education;Patient/family education;Manual techniques;Passive range of motion;Dry needling    PT Next Visit Plan  pelvic tilt in standing, wall slide, nustep, core and pelvic floor porgression, step ups    PT Home Exercise Plan  Access Code: [email or web address]    Consulted and Agree with Plan of Care  Patient;Family member/caregiver    Family Member Consulted  husband helps with all of her therapy due to her memory problems       Patient will benefit from skilled therapeutic intervention in order to improve the following deficits and impairments:  Abnormal gait, Decreased strength, Decreased coordination, Impaired tone, Postural dysfunction, Pain  Visit Diagnosis: Muscle weakness (generalized)  Abnormal posture  Unspecified lack of coordination     Problem List Patient Active Problem List   Diagnosis Date Noted  . MDD (major depressive disorder), recurrent, in full remission ([place]) 12/10/2019  . Pulmonary fibrosis ([place]) 12/04/2018  . MDD (major depressive disorder), recurrent episode, mild ([place]) 12/22/2017  . Osteoporosis 07/14/2017  . OA (osteoarthritis) of knee 11/03/2015  . Osteopenia 08/08/2015  . Headache 02/03/2015  . Atypical chest pain 07/15/2014  . Arthralgia 02/22/2014  . Psoriasis 02/22/2014  . Malignant neoplasm of lower-outer quadrant of right breast of female, estrogen receptor positive ([place]) 08/09/2013  . Neck pain 06/22/2013  . Left knee pain 11/29/2012  . Reactive depression (situational) 04/26/2012  . Right wrist pain 02/02/2012  . Right foot pain 10/04/2011  . Nevus 06/05/2011  . Status post total knee replacement 04/06/2011  . Overactive bladder 03/14/2011  . KNEE PAIN, LEFT 08/31/2010  . HEARING LOSS 08/17/2010  . HIRSUTISM 06/16/2009  . CYST, IDIOPATHIC 05/20/2008  .  IRRITABLE BOWEL SYNDROME 04/15/2008  . Allergic asthma, mild intermittent, uncomplicated [id number]  . GERD 03/19/2008  . COLONIC POLYPS, HX OF 03/19/2008  . NEPHROLITHIASIS, HX OF 03/19/2008  . Hypothyroidism 03/18/2008  . Hyperlipidemia 03/18/2008  . INSOMNIA, CHRONIC 03/18/2008  . FIBROMYALGIA 03/18/2008  . Prediabetes 03/18/2008    Jule Ser, PT 01/10/2020, 2:51 PM  [facility] [address] [place], [place], [address] Phone: [phone]   Fax:  [phone]  Name: Jean Nash MRN: [id number] Date of Birth: 07/18/1941

## 2020-01-10 NOTE — Patient Instructions (Signed)
Access Code: [email or web address]  URL: [email or web address]  Date: 01/10/2020  Prepared by: Jari Favre   Exercises Standing Bicep Curls with Resistance - 10 reps - 2 sets - 1x daily - 7x weekly Supine Hip Adductor Squeeze with Small Ball - 10 reps - 2 sets - 3 sec hold - 1x daily - 7x weekly Supine Posterior Pelvic Tilt - 10 reps - 2 sets - 3 sec hold - 1x daily - 7x weekly Sit to Stand with Pelvic Floor Contraction - 10 reps - 3 sets - 1x daily - 7x weekly Seated Pelvic Floor Contraction with Hip Abduction and Resistance Loop - 10 reps - 1 sets - 3 sec hold - 3x daily - 7x weekly Single Leg Balance with Pelvic Floor Contraction - 10 reps - 3 sets - 1x daily - 7x weekly Supine Transversus Abdominis Bracing with Double Leg Fallout - 10 reps - 2 sets - 1x daily - 7x weekly Standing Pelvic Tilt to Neutral Spine at Wall - 10 reps - 2 sets - 1x daily - 7x weekly

## 2020-01-10 NOTE — Telephone Encounter (Signed)
Mr. Jean Nash, would like to speak to you. [name], states, that you and him have been sending messages on Mychart this morning. He was by the area and stop by to see if you had information. Thanks

## 2020-01-11 ENCOUNTER — Encounter: Payer: Self-pay | Admitting: Adult Health

## 2020-01-15 ENCOUNTER — Encounter: Payer: Medicare HMO | Admitting: Physical Therapy

## 2020-01-17 ENCOUNTER — Encounter: Payer: Self-pay | Admitting: Physical Therapy

## 2020-01-17 ENCOUNTER — Ambulatory Visit: Payer: Medicare HMO | Attending: Internal Medicine

## 2020-01-17 ENCOUNTER — Ambulatory Visit: Payer: Medicare HMO | Attending: Adult Health | Admitting: Physical Therapy

## 2020-01-17 ENCOUNTER — Other Ambulatory Visit: Payer: Self-pay

## 2020-01-17 DIAGNOSIS — R293 Abnormal posture: Secondary | ICD-10-CM | POA: Insufficient documentation

## 2020-01-17 DIAGNOSIS — R279 Unspecified lack of coordination: Secondary | ICD-10-CM

## 2020-01-17 DIAGNOSIS — Z23 Encounter for immunization: Secondary | ICD-10-CM

## 2020-01-17 DIAGNOSIS — M6281 Muscle weakness (generalized): Secondary | ICD-10-CM | POA: Diagnosis present

## 2020-01-17 NOTE — Therapy (Signed)
[facility] [address], [place], [place], [address] Phone: [phone]   Fax:  [phone]  Physical Therapy Treatment  Patient Details  Name: Jean Nash MRN: [id number] Date of Birth: 03-09-1941 Referring Provider (PT): [name], NP   Encounter Date: 01/17/2020  PT End of Session - 01/17/20 1154    Visit Number  8    Date for PT Re-Evaluation  02/11/20    PT Start Time  [id number]    PT Stop Time  1225    PT Time Calculation (min)  40 min    Activity Tolerance  Patient tolerated treatment well    Behavior During Therapy  [facility] for tasks assessed/performed       Past Medical History:  Diagnosis Date  . Anxiety    PHOBIAS  . Arthritis   . Breast cancer ([place]) 08/08/13   right LOQ  . Cataract   . Chronic insomnia   . Cluster headaches    history of migraines / NONE FOR SEVERAL YRS  . Depression   . Fibromyalgia   . GERD (gastroesophageal reflux disease)   . H/O hiatal hernia   . History of colonic polyps   . History of transfusion 08/30/2013  . Hx of radiation therapy 10/29/13- 12/14/13   right chest wall 5040 cGy 28 sessions, right supraclavicular/axillary region 5040 cGy 28 sessions, right chest wall boost 1000 cGy 5 sessions  . Hypothyroidism   . Irritable bowel syndrome   . Kidney stone   . Lymphedema    RT ARM - WEARS SLEEVE  . Macular degeneration    hole/right eye  . Osteopenia   . Other abnormal glucose   . Other and unspecified hyperlipidemia   . Pain in joint, shoulder region   . Pneumonia [id number]  . Sleep apnea    USES C-PAP  . Stress incontinence, female     Past Surgical History:  Procedure Laterality Date  . ABDOMINAL HYSTERECTOMY    . APPENDECTOMY    . BILATERAL TOTAL MASTECTOMY WITH AXILLARY LYMPH NODE DISSECTION  08/30/2013   Dr Barry Dienes  . BREAST CYST ASPIRATION     9 cysts  . CATARACT EXTRACTION, BILATERAL  2005/2007  . CHOLECYSTECTOMY    . COLONOSCOPY    . CYSTOCELE  REPAIR    . EVACUATION BREAST HEMATOMA Left 08/31/2013   Procedure: EVACUATION HEMATOMA BREAST;  Surgeon: Stark Klein, MD;  Location: [place];  Service: General;  Laterality: Left;  . EYE SURGERY     to repair macular hole  . FOOT ARTHROPLASTY     lt   . GANGLION CYST EXCISION     rt foot  . HEMORRHOID SURGERY     03/1993  . JOINT REPLACEMENT  03/15/11   left knee replacement  . KNEE ARTHROSCOPY     /partial knee 2016/left knee 2012  . MASS EXCISION  11/04/2011   Procedure: EXCISION MASS;  Surgeon: Cammie Sickle., MD;  Location: [place];  Service: Orthopedics;  Laterality: Right;  excisional biopsy right ulna mass  . MASTECTOMY W/ SENTINEL NODE BIOPSY Right 08/30/2013   Procedure: RIGHT  AXILLARY SENTINEL LYMPH NODE BIOPSY; Right Axillary Node Disection;  Surgeon: Stark Klein, MD;  Location: [place];  Service: General;  Laterality: Right;  Right side nuc med 7:00   . PARTIAL KNEE ARTHROPLASTY Right 11/03/2015   Procedure: RIGHT KNEE MEDIAL UNICOMPARTMENTAL ARTHROPLASTY ;  Surgeon: Gaynelle Arabian, MD;  [facility];  Service: Orthopedics;  Laterality:  Right;  Marland Kitchen RECTOCELE REPAIR    . SIMPLE MASTECTOMY WITH AXILLARY SENTINEL NODE BIOPSY Left 08/30/2013   Procedure: Bilateral Breast Mastectomy ;  Surgeon: Stark Klein, MD;  Location: [place];  Service: General;  Laterality: Left;  . skin tags removed     breast, panty line, neckline  . TOE SURGERY     preventative crossover toe surg/right foot  . TOE SURGERY  2009   left foot/screw  in 2nd toe  . TONSILLECTOMY    . UPPER GASTROINTESTINAL ENDOSCOPY      There were no vitals filed for this visit.  Subjective Assessment - 01/17/20 1155    Subjective  No problem with leakage I am just having back pain after sitting for a long time.    Patient Stated Goals  leakage decreased and reduced frequency    Currently in Pain?  No/denies                  Out[facility]  Lymphedema Life Impact Scale Total Score  27.94 %           [facility] Adult PT Treatment/Exercise - 01/17/20 0001      Self-Care   Other Self-Care Comments   seated posture      Lumbar Exercises: Aerobic   Nustep  L3 x 10 min - PT present for status update      Lumbar Exercises: Standing   Other Standing Lumbar Exercises  wall slide and pelvic tilt - with 5 sec hold      Lumbar Exercises: Seated   Long Arc Quad on Chair  Strengthening;Both;20 reps    Sit to Stand Limitations  ball squeeze with kegel     Other Seated Lumbar Exercises  march on chair 20x    Other Seated Lumbar Exercises  UE flex with 2lb - focus on TrA contraction               PT Short Term Goals - 12/25/19 1019      PT SHORT TERM GOAL #1   Title  pt will be ind with initial HEP    Status  On-going      PT SHORT TERM GOAL #2   Title  Pt will be get the urge to void before leaking at least 60% of the time    Baseline  can tell when she has to go every time    Status  Achieved        PT Long Term Goals - 01/04/20 1027      PT LONG TERM GOAL #1   Title  ind with advanced HEP    Status  On-going      PT LONG TERM GOAL #2   Title  Pt will have nocturia of 1x or less due to improved pelvic muscle tone    Status  On-going      PT LONG TERM GOAL #3   Title  Pt will be able to cough, sneeze or laugh without leakage    Status  On-going      PT LONG TERM GOAL #4   Title  Pt will demonstrate imporved abdominal tone and be able to activate TrA during transfers such as supine to sit for reduced leakage            Plan - 01/17/20 1220    Clinical Impression Statement  Pt did well with education on posture in sitting.  She felt better with good  posture and had less back pain standing after the seated exercises.  Pt will continue to benefit from core and posutre strengthening.    PT Treatment/Interventions  ADLs/Self Care Home  Management;Biofeedback;Cryotherapy;Electrical Stimulation;Moist Heat;Therapeutic activities;Therapeutic exercise;Neuromuscular re-education;Patient/family education;Manual techniques;Passive range of motion;Dry needling    PT Next Visit Plan  add seated exercises UE flexion, LAQ and march to HEP; f/u on seated posture    PT Home Exercise Plan  Access Code: [email or web address]    Consulted and Agree with Plan of Care  Patient;Family member/caregiver    Family Member Consulted  husband helps with all of her therapy due to her memory problems       Patient will benefit from skilled therapeutic intervention in order to improve the following deficits and impairments:  Abnormal gait, Decreased strength, Decreased coordination, Impaired tone, Postural dysfunction, Pain  Visit Diagnosis: Muscle weakness (generalized)  Abnormal posture  Unspecified lack of coordination     Problem List Patient Active Problem List   Diagnosis Date Noted  . MDD (major depressive disorder), recurrent, in full remission ([place]) 12/10/2019  . Pulmonary fibrosis ([place]) 12/04/2018  . MDD (major depressive disorder), recurrent episode, mild ([place]) 12/22/2017  . Osteoporosis 07/14/2017  . OA (osteoarthritis) of knee 11/03/2015  . Osteopenia 08/08/2015  . Headache 02/03/2015  . Atypical chest pain 07/15/2014  . Arthralgia 02/22/2014  . Psoriasis 02/22/2014  . Malignant neoplasm of lower-outer quadrant of right breast of female, estrogen receptor positive ([place]) 08/09/2013  . Neck pain 06/22/2013  . Left knee pain 11/29/2012  . Reactive depression (situational) 04/26/2012  . Right wrist pain 02/02/2012  . Right foot pain 10/04/2011  . Nevus 06/05/2011  . Status post total knee replacement 04/06/2011  . Overactive bladder 03/14/2011  . KNEE PAIN, LEFT 08/31/2010  . HEARING LOSS 08/17/2010  . HIRSUTISM 06/16/2009  . CYST, IDIOPATHIC 05/20/2008  . IRRITABLE BOWEL SYNDROME 04/15/2008  . Allergic asthma, mild intermittent,  uncomplicated [id number]  . GERD 03/19/2008  . COLONIC POLYPS, HX OF 03/19/2008  . NEPHROLITHIASIS, HX OF 03/19/2008  . Hypothyroidism 03/18/2008  . Hyperlipidemia 03/18/2008  . INSOMNIA, CHRONIC 03/18/2008  . FIBROMYALGIA 03/18/2008  . Prediabetes 03/18/2008    Jule Ser, PT 01/17/2020, 12:32 PM  [facility] [address], [place], [place], [address] Phone: [phone]   Fax:  [phone]  Name: Jean Nash MRN: [id number] Date of Birth: [birth year]

## 2020-01-17 NOTE — Progress Notes (Signed)
   Covid-19 Vaccination Clinic  Name:  Jean Nash    MRN: [id number] DOB: 1941/06/27  01/17/2020  [name] was observed post Covid-19 immunization for 15 minutes without incident. She was provided with Vaccine Information Sheet and instruction to access the V-Safe system.   [name] was instructed to call 911 with any severe reactions post vaccine: Marland Kitchen Difficulty breathing  . Swelling of face and throat  . A fast heartbeat  . A bad rash all over body  . Dizziness and weakness   Immunizations Administered    Name Date Dose VIS Date Route   Pfizer COVID-19 Vaccine 01/17/2020  1:02 PM 0.3 mL 10/26/2019 Intramuscular   Manufacturer: [place]   Lot: [id number]   [place]: [id number]

## 2020-01-22 ENCOUNTER — Encounter: Payer: Self-pay | Admitting: Physical Therapy

## 2020-01-22 ENCOUNTER — Other Ambulatory Visit: Payer: Self-pay

## 2020-01-22 ENCOUNTER — Ambulatory Visit: Payer: Medicare HMO | Admitting: Physical Therapy

## 2020-01-22 DIAGNOSIS — R293 Abnormal posture: Secondary | ICD-10-CM

## 2020-01-22 DIAGNOSIS — M6281 Muscle weakness (generalized): Secondary | ICD-10-CM

## 2020-01-22 DIAGNOSIS — R279 Unspecified lack of coordination: Secondary | ICD-10-CM

## 2020-01-22 NOTE — Therapy (Signed)
[facility] [address] [place], [place], [address] Phone: [phone]   Fax:  [phone]  Physical Therapy Treatment  Patient Details  Name: Jean Nash MRN: [id number] Date of Birth: [birth year] Referring Provider (PT): [name], NP   Encounter Date: 01/22/2020  PT End of Session - 01/22/20 1148    Visit Number  9    Date for PT Re-Evaluation  02/11/20    PT Start Time  1143    PT Stop Time  1225    PT Time Calculation (min)  42 min    Activity Tolerance  Patient tolerated treatment well    Behavior During Therapy  [facility] for tasks assessed/performed       Past Medical History:  Diagnosis Date  . Anxiety    PHOBIAS  . Arthritis   . Breast cancer ([place]) 08/08/13   right LOQ  . Cataract   . Chronic insomnia   . Cluster headaches    history of migraines / NONE FOR SEVERAL YRS  . Depression   . Fibromyalgia   . GERD (gastroesophageal reflux disease)   . H/O hiatal hernia   . History of colonic polyps   . History of transfusion 08/30/2013  . Hx of radiation therapy 10/29/13- 12/14/13   right chest wall 5040 cGy 28 sessions, right supraclavicular/axillary region 5040 cGy 28 sessions, right chest wall boost 1000 cGy 5 sessions  . Hypothyroidism   . Irritable bowel syndrome   . Kidney stone   . Lymphedema    RT ARM - WEARS SLEEVE  . Macular degeneration    hole/right eye  . Osteopenia   . Other abnormal glucose   . Other and unspecified hyperlipidemia   . Pain in joint, shoulder region   . Pneumonia [id number]  . Sleep apnea    USES C-PAP  . Stress incontinence, female     Past Surgical History:  Procedure Laterality Date  . ABDOMINAL HYSTERECTOMY    . APPENDECTOMY    . BILATERAL TOTAL MASTECTOMY WITH AXILLARY LYMPH NODE DISSECTION  08/30/2013   Dr Barry Dienes  . BREAST CYST ASPIRATION     9 cysts  . CATARACT EXTRACTION, BILATERAL  2005/2007  . CHOLECYSTECTOMY    . COLONOSCOPY    . CYSTOCELE  REPAIR    . EVACUATION BREAST HEMATOMA Left 08/31/2013   Procedure: EVACUATION HEMATOMA BREAST;  Surgeon: Stark Klein, MD;  Location: [place];  Service: General;  Laterality: Left;  . EYE SURGERY     to repair macular hole  . FOOT ARTHROPLASTY     lt   . GANGLION CYST EXCISION     rt foot  . HEMORRHOID SURGERY     03/1993  . JOINT REPLACEMENT  03/15/11   left knee replacement  . KNEE ARTHROSCOPY     /partial knee 2016/left knee 2012  . MASS EXCISION  11/04/2011   Procedure: EXCISION MASS;  Surgeon: Cammie Sickle., MD;  Location: [place];  Service: Orthopedics;  Laterality: Right;  excisional biopsy right ulna mass  . MASTECTOMY W/ SENTINEL NODE BIOPSY Right 08/30/2013   Procedure: RIGHT  AXILLARY SENTINEL LYMPH NODE BIOPSY; Right Axillary Node Disection;  Surgeon: Stark Klein, MD;  Location: [place];  Service: General;  Laterality: Right;  Right side nuc med 7:00   . PARTIAL KNEE ARTHROPLASTY Right 11/03/2015   Procedure: RIGHT KNEE MEDIAL UNICOMPARTMENTAL ARTHROPLASTY ;  Surgeon: Gaynelle Arabian, MD;  [facility];  Service: Orthopedics;  Laterality:  Right;  Marland Kitchen RECTOCELE REPAIR    . SIMPLE MASTECTOMY WITH AXILLARY SENTINEL NODE BIOPSY Left 08/30/2013   Procedure: Bilateral Breast Mastectomy ;  Surgeon: Stark Klein, MD;  Location: [place];  Service: General;  Laterality: Left;  . skin tags removed     breast, panty line, neckline  . TOE SURGERY     preventative crossover toe surg/right foot  . TOE SURGERY  2009   left foot/screw  in 2nd toe  . TONSILLECTOMY    . UPPER GASTROINTESTINAL ENDOSCOPY      There were no vitals filed for this visit.  Subjective Assessment - 01/22/20 1149    Subjective  My back doesn't hurt today.  Pt is going through a tough time because a friend has poor health and very sick d/t cancer. bladder issues are still much better.    Patient is accompained by:  Family member    Patient Stated Goals  leakage decreased and reduced frequency     Currently in Pain?  No/denies                  [facility]  Lymphedema Life Impact Scale Total Score  27.94 %           [facility] Adult PT Treatment/Exercise - 01/22/20 0001      Lumbar Exercises: Stretches   Other Lumbar Stretch Exercise  rolling green ball for lumbar and pec stretch - 10x 3 sec      Lumbar Exercises: Aerobic   Nustep  L3 x 10 min - PT present for status update      Lumbar Exercises: Standing   Other Standing Lumbar Exercises  wall slide and pelvic tilt - with 5 sec hold - reviewed due to pt not able to practice this at home    Other Standing Lumbar Exercises  standing on foam mat hip abduction 10x each side; step up 20x;       Lumbar Exercises: Seated   Long Arc Quad on Chair  Strengthening;Both;20 reps   2 sets with 4.5 lb; ball squeeze   Other Seated Lumbar Exercises  march on chair 30x - 4.5lb       pallof punch with green band in staggered stance  - 15x each side        PT Short Term Goals - 12/25/19 1019      PT SHORT TERM GOAL #1   Title  pt will be ind with initial HEP    Status  On-going      PT SHORT TERM GOAL #2   Title  Pt will be get the urge to void before leaking at least 60% of the time    Baseline  can tell when she has to go every time    Status  Achieved        PT Long Term Goals - 01/04/20 1027      PT LONG TERM GOAL #1   Title  ind with advanced HEP    Status  On-going      PT LONG TERM GOAL #2   Title  Pt will have nocturia of 1x or less due to improved pelvic muscle tone    Status  On-going      PT LONG TERM GOAL #3   Title  Pt will be able to cough, sneeze or laugh without leakage    Status  On-going      PT LONG TERM GOAL #4  Title  Pt will demonstrate imporved abdominal tone and be able to activate TrA during transfers such as supine to sit for reduced leakage            Plan - 01/22/20 1234    Clinical Impression  Statement  Pt did well with exercises.  No pain as she was monitored throughout treatment.  Pt needed VC and TC with review of exercises against the wall.  Pt still needs cues to know when to engage pelvic floor.  She was having a hard time focusing today due to being worried about her friend. Pt will benefit from skilled PT to work towards being ind with exercises so she can maintain strength to avoid leakage and urgency.    PT Treatment/Interventions  ADLs/Self Care Home Management;Biofeedback;Cryotherapy;Electrical Stimulation;Moist Heat;Therapeutic activities;Therapeutic exercise;Neuromuscular re-education;Patient/family education;Manual techniques;Passive range of motion;Dry needling    PT Next Visit Plan  prgoress resistance as tolerated; balance with abdominal and pelvic bracing    PT Home Exercise Plan  Access Code: [email or web address]    Consulted and Agree with Plan of Care  Patient;Family member/caregiver    Family Member Consulted  husband helps with all of her therapy due to her memory problems       Patient will benefit from skilled therapeutic intervention in order to improve the following deficits and impairments:  Abnormal gait, Decreased strength, Decreased coordination, Impaired tone, Postural dysfunction, Pain  Visit Diagnosis: Muscle weakness (generalized)  Abnormal posture  Unspecified lack of coordination     Problem List Patient Active Problem List   Diagnosis Date Noted  . MDD (major depressive disorder), recurrent, in full remission ([place]) 12/10/2019  . Pulmonary fibrosis ([place]) 12/04/2018  . MDD (major depressive disorder), recurrent episode, mild ([place]) 12/22/2017  . Osteoporosis 07/14/2017  . OA (osteoarthritis) of knee 11/03/2015  . Osteopenia 08/08/2015  . Headache 02/03/2015  . Atypical chest pain 07/15/2014  . Arthralgia 02/22/2014  . Psoriasis 02/22/2014  . Malignant neoplasm of lower-outer quadrant of right breast of female, estrogen receptor positive ([place])  08/09/2013  . Neck pain 06/22/2013  . Left knee pain 11/29/2012  . Reactive depression (situational) 04/26/2012  . Right wrist pain 02/02/2012  . Right foot pain 10/04/2011  . Nevus 06/05/2011  . Status post total knee replacement 04/06/2011  . Overactive bladder 03/14/2011  . KNEE PAIN, LEFT 08/31/2010  . HEARING LOSS 08/17/2010  . HIRSUTISM 06/16/2009  . CYST, IDIOPATHIC 05/20/2008  . IRRITABLE BOWEL SYNDROME 04/15/2008  . Allergic asthma, mild intermittent, uncomplicated [id number]  . GERD 03/19/2008  . COLONIC POLYPS, HX OF 03/19/2008  . NEPHROLITHIASIS, HX OF 03/19/2008  . Hypothyroidism 03/18/2008  . Hyperlipidemia 03/18/2008  . INSOMNIA, CHRONIC 03/18/2008  . FIBROMYALGIA 03/18/2008  . Prediabetes 03/18/2008    Jule Ser, PT 01/22/2020, 12:37 PM  [facility] [address] [place], [place], [address] Phone: [phone]   Fax:  [phone]  Name: Jean Nash MRN: [id number] Date of Birth: February 03, 1941

## 2020-01-24 ENCOUNTER — Ambulatory Visit: Payer: Medicare HMO | Admitting: Physical Therapy

## 2020-01-25 ENCOUNTER — Encounter: Payer: Self-pay | Admitting: Adult Health

## 2020-01-25 DIAGNOSIS — Z760 Encounter for issue of repeat prescription: Secondary | ICD-10-CM

## 2020-01-25 MED ORDER — OMEGA-3-ACID ETHYL ESTERS 1 G PO CAPS: 1.0000 | ORAL_CAPSULE | Freq: Two times a day (BID) | ORAL | 2 refills | Status: DC

## 2020-01-28 ENCOUNTER — Encounter: Payer: Self-pay | Admitting: *Deleted

## 2020-01-29 ENCOUNTER — Other Ambulatory Visit: Payer: Self-pay

## 2020-01-29 ENCOUNTER — Ambulatory Visit: Payer: Medicare HMO | Admitting: Physical Therapy

## 2020-01-29 DIAGNOSIS — M6281 Muscle weakness (generalized): Secondary | ICD-10-CM

## 2020-01-29 DIAGNOSIS — R293 Abnormal posture: Secondary | ICD-10-CM

## 2020-01-29 DIAGNOSIS — R279 Unspecified lack of coordination: Secondary | ICD-10-CM

## 2020-01-29 NOTE — Therapy (Signed)
[facility] [address], [place], [place], [address] Phone: [phone]   Fax:  [phone]  Physical Therapy Treatment Progress Note Reporting Period 12/17/19  to 01/29/20   See note below for Objective Data and Assessment of Progress/Goals.      Patient Details  Name: Jean Nash MRN: [id number] Date of Birth: 20-May-1941 Referring Provider (PT): [name], NP   Encounter Date: 01/29/2020  PT End of Session - 01/29/20 1221    Visit Number  10    Date for PT Re-Evaluation  02/11/20    PT Start Time  2751    PT Stop Time  1223    PT Time Calculation (min)  38 min    Activity Tolerance  Patient tolerated treatment well    Behavior During Therapy  [facility] for tasks assessed/performed       Past Medical History:  Diagnosis Date  . Adenomatous colon polyp   . Anxiety    PHOBIAS  . Arthritis   . Breast cancer ([place]) 08/08/13   right LOQ  . Cataract   . Chronic insomnia   . Cluster headaches    history of migraines / NONE FOR SEVERAL YRS  . Depression   . Diverticulosis   . Fatty liver 2011  . Fibromyalgia   . GERD (gastroesophageal reflux disease)   . H/O hiatal hernia   . History of transfusion 08/30/2013  . Hx of radiation therapy 10/29/13- 12/14/13   right chest wall 5040 cGy 28 sessions, right supraclavicular/axillary region 5040 cGy 28 sessions, right chest wall boost 1000 cGy 5 sessions  . Hypothyroidism   . Internal hemorrhoids   . Irritable bowel syndrome   . Kidney stone   . Lymphedema    RT ARM - WEARS SLEEVE  . Macular degeneration    hole/right eye  . MDD (major depressive disorder)   . Osteopenia   . Other abnormal glucose   . Other and unspecified hyperlipidemia   . Pain in joint, shoulder region   . Pneumonia 7001,7494  . Sleep apnea    USES C-PAP  . Stress incontinence, female     Past Surgical History:  Procedure Laterality Date  . ABDOMINAL HYSTERECTOMY    .  APPENDECTOMY    . BILATERAL TOTAL MASTECTOMY WITH AXILLARY LYMPH NODE DISSECTION  08/30/2013   Dr Barry Dienes  . BREAST CYST ASPIRATION     9 cysts  . CATARACT EXTRACTION, BILATERAL  2005/2007  . CHOLECYSTECTOMY    . COLONOSCOPY    . CYSTOCELE REPAIR    . EVACUATION BREAST HEMATOMA Left 08/31/2013   Procedure: EVACUATION HEMATOMA BREAST;  Surgeon: Stark Klein, MD;  Location: [place];  Service: General;  Laterality: Left;  . EYE SURGERY     to repair macular hole  . FOOT ARTHROPLASTY     lt   . GANGLION CYST EXCISION     rt foot  . HEMORRHOID SURGERY     03/1993  . JOINT REPLACEMENT  03/15/11   left knee replacement  . KNEE ARTHROSCOPY     /partial knee 2016/left knee 2012  . MASS EXCISION  11/04/2011   Procedure: EXCISION MASS;  Surgeon: Cammie Sickle., MD;  Location: [place];  Service: Orthopedics;  Laterality: Right;  excisional biopsy right ulna mass  . MASTECTOMY W/ SENTINEL NODE BIOPSY Right 08/30/2013   Procedure: RIGHT  AXILLARY SENTINEL LYMPH NODE BIOPSY; Right Axillary Node Disection;  Surgeon: Stark Klein, MD;  [facility];  Service: General;  Laterality: Right;  Right side nuc med 7:00   . PARTIAL KNEE ARTHROPLASTY Right 11/03/2015   Procedure: RIGHT KNEE MEDIAL UNICOMPARTMENTAL ARTHROPLASTY ;  Surgeon: Gaynelle Arabian, MD;  [facility];  Service: Orthopedics;  Laterality: Right;  . RECTOCELE REPAIR    . SIMPLE MASTECTOMY WITH AXILLARY SENTINEL NODE BIOPSY Left 08/30/2013   Procedure: Bilateral Breast Mastectomy ;  Surgeon: Stark Klein, MD;  Location: [place];  Service: General;  Laterality: Left;  . skin tags removed     breast, panty line, neckline  . TOE SURGERY     preventative crossover toe surg/right foot  . TOE SURGERY  2009   left foot/screw  in 2nd toe  . TONSILLECTOMY    . UPPER GASTROINTESTINAL ENDOSCOPY      There were no vitals filed for this visit.  Subjective Assessment - 01/29/20 1150    Subjective  I leaked in the middle  of the night last and was soaked through.  I can't always control it at night.    Patient Stated Goals  leakage decreased and reduced frequency    Currently in Pain?  Yes    Pain Score  8     Pain Location  Back    Pain Orientation  Right    Pain Descriptors / Indicators  Aching    Pain Type  Chronic pain    Multiple Pain Sites  No                  Outpatient Rehab from 08/24/2018 in [place]  Lymphedema Life Impact Scale Total Score  27.94 %           [facility] Adult PT Treatment/Exercise - 01/29/20 0001      Neuro Re-ed    Neuro Re-ed Details   TC to pelvic floor to ensure lift      Lumbar Exercises: Aerobic   Nustep  L3 x 10 min - PT present for status update      Lumbar Exercises: Supine   Bridge  15 reps;4 seconds    Bridge with clamshell  10 reps;5 seconds    Large Ball Abdominal Isometric Limitations  rolling pball with LE - 15x holding pelvic contraction    Other Supine Lumbar Exercises  clam blue band             PT Education - 01/29/20 1219    Education Details  Access Code: [email or web address]    Person(s) Educated  Patient;Spouse    Methods  Explanation;Demonstration;Tactile cues;Verbal cues;Handout    Comprehension  Verbalized understanding;Returned demonstration       PT Short Term Goals - 12/25/19 1019      PT SHORT TERM GOAL #1   Title  pt will be ind with initial HEP    Status  On-going      PT SHORT TERM GOAL #2   Title  Pt will be get the urge to void before leaking at least 60% of the time    Baseline  can tell when she has to go every time    Status  Achieved        PT Long Term Goals - 01/29/20 1213      PT LONG TERM GOAL #1   Title  ind with advanced HEP    Status  Achieved      PT LONG TERM GOAL #2   Title  Pt will have nocturia of 1x or less due to improved  pelvic muscle tone    Baseline  2-3x/night      PT LONG TERM GOAL #3   Title  Pt will be able to cough, sneeze or laugh without  leakage    Baseline  have this when I cough or sneeze hard    Status  Partially Met      PT LONG TERM GOAL #4   Title  Pt will demonstrate imporved abdominal tone and be able to activate TrA during transfers such as supine to sit for reduced leakage    Baseline  can do this during the day but still hard to do just after waking up    Status  Partially Met            Plan - 01/29/20 1230    Clinical Impression Statement  Pt feels much better during the day and had not had leakage other than hard coughs or sneezes.  Pt still has leakage at night.  She has low endurance and can hold contraction for only 3-4 sec.  At this time she is ind with HEP and her and her husband under stand how to gradually increase the exercise progression.  She will discharge with HEP today.    Comorbidities  Anxiety, Arthritis, Breast cancer ([place]) (08/08/13), Cataract, Chronic insomnia, Cluster headaches, Depression, Fibromyalgia, GERD (gastroesophageal reflux disease), H/O hiatal hernia, History of colonic polyps, History of transfusion (08/30/2013), radiation therapy (10/29/13- 12/14/13), Hypothyroidism, Irritable bowel syndrome, Kidney stone, Lymphedema, Macular degeneration, Osteopenia, Other abnormal glucose    PT Treatment/Interventions  ADLs/Self Care Home Management;Biofeedback;Cryotherapy;Electrical Stimulation;Moist Heat;Therapeutic activities;Therapeutic exercise;Neuromuscular re-education;Patient/family education;Manual techniques;Passive range of motion;Dry needling    PT Next Visit Plan  d/c today    PT Home Exercise Plan  Access Code: [email or web address]    Consulted and Agree with Plan of Care  Patient;Family member/caregiver    Family Member Consulted  husband helps with all of her therapy due to her memory problems       Patient will benefit from skilled therapeutic intervention in order to improve the following deficits and impairments:  Abnormal gait, Decreased strength, Decreased coordination, Impaired tone,  Postural dysfunction, Pain  Visit Diagnosis: Muscle weakness (generalized)  Abnormal posture  Unspecified lack of coordination     Problem List Patient Active Problem List   Diagnosis Date Noted  . MDD (major depressive disorder), recurrent, in full remission ([place]) 12/10/2019  . Pulmonary fibrosis ([place]) 12/04/2018  . MDD (major depressive disorder), recurrent episode, mild ([place]) 12/22/2017  . Osteoporosis 07/14/2017  . OA (osteoarthritis) of knee 11/03/2015  . Osteopenia 08/08/2015  . Headache 02/03/2015  . Atypical chest pain 07/15/2014  . Arthralgia 02/22/2014  . Psoriasis 02/22/2014  . Malignant neoplasm of lower-outer quadrant of right breast of female, estrogen receptor positive ([place]) 08/09/2013  . Neck pain 06/22/2013  . Left knee pain 11/29/2012  . Reactive depression (situational) 04/26/2012  . Right wrist pain 02/02/2012  . Right foot pain 10/04/2011  . Nevus 06/05/2011  . Status post total knee replacement 04/06/2011  . Overactive bladder 03/14/2011  . KNEE PAIN, LEFT 08/31/2010  . HEARING LOSS 08/17/2010  . HIRSUTISM 06/16/2009  . CYST, IDIOPATHIC 05/20/2008  . IRRITABLE BOWEL SYNDROME 04/15/2008  . Allergic asthma, mild intermittent, uncomplicated 36/10/2448  . GERD 03/19/2008  . COLONIC POLYPS, HX OF 03/19/2008  . NEPHROLITHIASIS, HX OF 03/19/2008  . Hypothyroidism 03/18/2008  . Hyperlipidemia 03/18/2008  . INSOMNIA, CHRONIC 03/18/2008  . FIBROMYALGIA 03/18/2008  . Prediabetes 03/18/2008    Jule Ser, PT  01/29/2020, 12:41 PM  [facility] [address], [place], [place], [address] Phone: [phone]   Fax:  [phone]  Name: Jean Nash MRN: [id number] Date of Birth: 10-Jul-1941  PHYSICAL THERAPY DISCHARGE SUMMARY  Visits from Start of Care: 10  Current functional level related to goals / functional outcomes: See goals above   Remaining deficits: See above    Education / Equipment: HEP Plan: Patient agrees to discharge.  Patient goals were partially met. Patient is being discharged due to being pleased with the current functional level.  ?????    [employer], PT 01/29/20 12:43 PM

## 2020-01-29 NOTE — Patient Instructions (Signed)
Access Code: [email or web address] URL: [email or web address] Date: 01/29/2020 Prepared by: Jari Favre  Exercises Standing Bicep Curls with Resistance - 1 x daily - 7 x weekly - 10 reps - 2 sets Supine Hip Adductor Squeeze with Small Ball - 1 x daily - 7 x weekly - 10 reps - 2 sets - 5 sec hold Supine Posterior Pelvic Tilt - 1 x daily - 7 x weekly - 10 reps - 2 sets - 5 sec hold Sit to Stand with Pelvic Floor Contraction - 1 x daily - 7 x weekly - 10 reps - 3 sets Seated Pelvic Floor Contraction with Hip Abduction and Resistance Loop - 3 x daily - 7 x weekly - 10 reps - 1 sets - 5 sec hold Single Leg Balance with Pelvic Floor Contraction - 1 x daily - 7 x weekly - 10 reps - 3 sets Supine Transversus Abdominis Bracing with Double Leg Fallout - 1 x daily - 7 x weekly - 10 reps - 2 sets Standing Pelvic Tilt to Neutral Spine at Wall - 1 x daily - 7 x weekly - 10 reps - 2 sets  Patient Education Office Posture

## 2020-01-30 ENCOUNTER — Ambulatory Visit (INDEPENDENT_AMBULATORY_CARE_PROVIDER_SITE_OTHER)
Admission: RE | Admit: 2020-01-30 | Discharge: 2020-01-30 | Disposition: A | Discharge status: Home / Self Care | Payer: Medicare HMO | Source: Ambulatory Visit | Attending: Physician Assistant | Admitting: Physician Assistant

## 2020-01-30 ENCOUNTER — Other Ambulatory Visit: Payer: Self-pay

## 2020-01-30 ENCOUNTER — Ambulatory Visit (INDEPENDENT_AMBULATORY_CARE_PROVIDER_SITE_OTHER): Payer: Medicare HMO | Admitting: Physician Assistant

## 2020-01-30 ENCOUNTER — Encounter: Payer: Self-pay | Admitting: Physician Assistant

## 2020-01-30 VITALS — BP 118/80 | HR 56 | Temp 97.7°F | Ht 59.0 in | Wt 174.6 lb

## 2020-01-30 DIAGNOSIS — R05 Cough: Secondary | ICD-10-CM

## 2020-01-30 DIAGNOSIS — R053 Chronic cough: Secondary | ICD-10-CM

## 2020-01-30 MED ORDER — FAMOTIDINE 40 MG PO TABS: 40.0000 mg | ORAL_TABLET | Freq: Every day | ORAL | 11 refills | Status: DC

## 2020-01-30 MED ORDER — FAMOTIDINE 40 MG PO TABS: 40.0000 mg | ORAL_TABLET | Freq: Every day | ORAL | 3 refills | Status: DC

## 2020-01-30 NOTE — Progress Notes (Signed)
Reviewed and agree with management plan.  Caelyn Route T. Aziah Kaiser, MD FACG [facility]  

## 2020-01-30 NOTE — Progress Notes (Signed)
Subjective:    [name], female    DOB: [birth year], 79 y.o.   MRN: [id number]  HPI Jean Nash is a pleasant 79 year old white female, established with Dr. Fuller Plan, last seen in 2018 when she had colonoscopy.  She comes in today with complaints of persistent/chronic cough, referred by Sallee Provencal NP with concerns regarding GERD related cough. Patient does have history of chronic GERD and has been maintained on Nexium 40 mg p.o. daily long-term.  She had a very remote upper endoscopy done in [place] prior to moving here, over 10 years ago.  She knows she has a hiatal hernia.  She says her cough has been present for multiple months not necessarily progressive but feels the intensity of the cough has worsened.  She says she is frequently coughing and sometimes coughs to the point that she cannot breathe.  Cough is nonproductive and generally worse in the early morning when she first gets up and settles down by the afternoon.  She is not having any nocturnal symptoms waking her from sleep.  She denies shortness of breath.  She has not been placed on any cough suppressants or antihistamines.  No history of chronic rhinitis etc.  She denies any dysphagia or odynophagia, no coughing or strangling with meals. Colonoscopy in 2018 for history of adenomatous colon polyps with one 6 mm polyp removed which was a tubular adenoma, also noted internal hemorrhoids and multiple diverticuli. Patient has been seen in the past by Dr. Keturah Barre, last in 2020, she has history of sleep apnea and felt to have pulmonary fibrosis.  She was to have a CT of the chest done several months ago but had delayed that due to Covid etc. Also with history of fibromyalgia and breast cancer for which she has had mastectomies and radiation.  Per Dr. Janee Morn notes pulmonary fibrosis may be radiation related.  Review of Systems Pertinent positive and negative review of systems were noted in the above HPI section.  All other  review of systems was otherwise negative.  Outpatient Encounter Medications as of 01/30/2020  Medication Sig  . butalbital-acetaminophen-caffeine (FIORICET) 50-325-40 MG tablet Take 1-2 tablets by mouth every 6 (six) hours as needed for headache.  . Calcium Carb-Cholecalciferol (CALCIUM-VITAMIN D) 500-400 MG-UNIT TABS Take by mouth 2 (two) times daily.   . cholecalciferol (VITAMIN D) 1000 units tablet Take 2,000 Units by mouth daily.  Marland Kitchen co-enzyme Q-10 30 MG capsule Take 300 mg by mouth daily.  . diazepam (VALIUM) 5 MG tablet TAKE 1 TO 2 TABLETS BY MOUTH AT BEDTIME. TAKE 1 OR 2 PILLS 1 HOUR PRIOR TO DENTAL APPOINTMENT  . diclofenac sodium (VOLTAREN) 1 % GEL as needed.   Marland Kitchen esomeprazole (NEXIUM) 40 MG capsule Take 1 capsule by mouth  daily  . levothyroxine (SYNTHROID) 100 MCG tablet TAKE 1 TABLET BY MOUTH ONCE DAILY BEFORE BREAKFAST AND 1 TABLET IN THE MIDDLE OF THE NIGHT  . LUTEIN PO Take by mouth.  . meloxicam (MOBIC) 7.5 MG tablet Take 1 tablet by mouth twice daily as needed for pain  . methocarbamol (ROBAXIN) 500 MG tablet Take 500 mg by mouth as needed for muscle spasms.  Marland Kitchen omega-3 acid ethyl esters (LOVAZA) 1 g capsule Take 1 capsule (1 g total) by mouth 2 (two) times daily.  Marland Kitchen oxyCODONE-acetaminophen (PERCOCET) 5-325 MG tablet Take by mouth as needed for severe pain.  . polyethylene glycol (MIRALAX / GLYCOLAX) packet Take 17 g by mouth daily.  . pregabalin (LYRICA) 150  MG capsule Take 1 capsule (150 mg total) by mouth 2 (two) times daily.  Marland Kitchen saccharomyces boulardii (FLORASTOR) 250 MG capsule Take 250 mg by mouth daily.  . simvastatin (ZOCOR) 10 MG tablet TAKE 1 TABLET BY MOUTH AT BEDTIME  . topiramate (TOPAMAX) 25 MG tablet Take 1 tablet (25 mg total) by mouth daily.  . traMADol (ULTRAM) 50 MG tablet Take 1 tablet (50 mg total) by mouth every 12 (twelve) hours as needed for moderate pain.  . TURMERIC PO Take 1,000 mg by mouth daily.  Marland Kitchen venlafaxine XR (EFFEXOR-XR) 37.5 MG 24 hr capsule Take  1 capsule (37.5 mg total) by mouth daily with breakfast.  . famotidine (PEPCID) 40 MG tablet Take 1 tablet (40 mg total) by mouth at bedtime.  . [DISCONTINUED] Biotin 1000 MCG CHEW Chew by mouth.  . [DISCONTINUED] famotidine (PEPCID) 40 MG tablet Take 1 tablet (40 mg total) by mouth at bedtime.   No facility-administered encounter medications on file as of 01/30/2020.   No Known Allergies Patient Active Problem List   Diagnosis Date Noted  . MDD (major depressive disorder), recurrent, in full remission ([place]) 12/10/2019  . Pulmonary fibrosis ([place]) 12/04/2018  . MDD (major depressive disorder), recurrent episode, mild ([place]) 12/22/2017  . Osteoporosis 07/14/2017  . OA (osteoarthritis) of knee 11/03/2015  . Osteopenia 08/08/2015  . Headache 02/03/2015  . Atypical chest pain 07/15/2014  . Arthralgia 02/22/2014  . Psoriasis 02/22/2014  . Malignant neoplasm of lower-outer quadrant of right breast of female, estrogen receptor positive ([place]) 08/09/2013  . Neck pain 06/22/2013  . Left knee pain 11/29/2012  . Reactive depression (situational) 04/26/2012  . Right wrist pain 02/02/2012  . Right foot pain 10/04/2011  . Nevus 06/05/2011  . Status post total knee replacement 04/06/2011  . Overactive bladder 03/14/2011  . KNEE PAIN, LEFT 08/31/2010  . HEARING LOSS 08/17/2010  . HIRSUTISM 06/16/2009  . CYST, IDIOPATHIC 05/20/2008  . IRRITABLE BOWEL SYNDROME 04/15/2008  . Allergic asthma, mild intermittent, uncomplicated [id number]  . GERD 03/19/2008  . COLONIC POLYPS, HX OF 03/19/2008  . NEPHROLITHIASIS, HX OF 03/19/2008  . Hypothyroidism 03/18/2008  . Hyperlipidemia 03/18/2008  . INSOMNIA, CHRONIC 03/18/2008  . FIBROMYALGIA 03/18/2008  . Prediabetes 03/18/2008   Social History   Socioeconomic History  . Marital status: Married    Spouse name: Not on file  . Number of children: 3  . Years of education: Not on file  . Highest education level: Not on file  Occupational History  .  Occupation: retired bookkeeper  Tobacco Use  . Smoking status: Former Smoker    Packs/day: 0.10    Years: 2.00    Pack years: 0.20    Types: Cigarettes    Start date: 11/16/1959    Quit date: 11/15/1960    Years since quitting: 59.2  . Smokeless tobacco: Never Used  Substance and Sexual Activity  . Alcohol use: No    Alcohol/week: 0.0 standard drinks  . Drug use: No  . Sexual activity: Not on file    Comment: menarche age 53, fist live birth 30, P 3, hysterectomy age 86, no HRT, BCP 2 yrs  Other Topics Concern  . Not on file  Social History Narrative   Occupation:  Retired Radiation protection practitioner    Married with 3 grown children      Never Smoked     Alcohol use-no        Social Determinants of Radio broadcast assistant Strain:   . Difficulty of Paying  Living Expenses:   Food Insecurity:   . Worried About Charity fundraiser in the Last Year:   . Arboriculturist in the Last Year:   Transportation Needs:   . Film/video editor (Medical):   Marland Kitchen Lack of Transportation (Non-Medical):   Physical Activity:   . Days of Exercise per Week:   . Minutes of Exercise per Session:   Stress:   . Feeling of Stress :   Social Connections:   . Frequency of Communication with Friends and Family:   . Frequency of Social Gatherings with Friends and Family:   . Attends Religious Services:   . Active Member of Clubs or Organizations:   . Attends Archivist Meetings:   Marland Kitchen Marital Status:   Intimate Partner Violence:   . Fear of Current or Ex-Partner:   . Emotionally Abused:   Marland Kitchen Physically Abused:   . Sexually Abused:     [name] family history includes Brain cancer (age of onset: 54) in her cousin; Brain cancer (age of onset: 73) in her cousin; Brain cancer (age of onset: 70) in her maternal uncle; Breast cancer in her paternal aunt; Breast cancer (age of onset: 84) in her paternal aunt and paternal grandmother; Breast cancer (age of onset: 26) in her sister; Breast cancer (age of  onset: 70) in her mother; Cancer in her maternal grandmother; Diabetes in her maternal grandfather and mother; Stroke in her mother.      Objective:    Vitals:   01/30/20 1331  BP: 118/80  Pulse: (!) 56  Temp: 97.7 F (36.5 C)    Physical Exam Well-developed well-nourished elderly white in no acute distress.  Accompanied by her husband.  Patient is upset today as she found out just prior to this visit that her best friend had passed away.   Height, Weight 174, BMI 35.2  HEENT; nontraumatic normocephalic, EOMI, PE RR LA, sclera anicteric. Oropharynx; not examined Neck; supple, no JVD Cardiovascular; regular rate and rhythm with S1-S2, no murmur rub or gallop Pulmonary; Clear bilaterally, no wheezing or rhonchi.  She did cough frequently during this visit Abdomen; soft, nontender, nondistended, no palpable mass or hepatosplenomegaly, bowel sounds are active Rectal; not done Skin; benign exam, no jaundice rash or appreciable lesions Extremities; no clubbing cyanosis or edema skin warm and dry Neuro/Psych; alert and oriented x4, grossly nonfocal mood and affect appropriate       Assessment & Plan:   #46 79 year old white female with chronic cough present times multiple months, nonproductive, worse in the early mornings. She does have history of chronic reflux which had been well controlled on Nexium, I am not convinced her current cough symptoms are secondary to GERD, though certainly possible. Patient has by history pulmonary fibrosis-  #2 prior history of sleep apnea resolved with weight loss #3 breast cancer status post mastectomies and radiation #4 fibromyalgia 5.  History of IBS 6.  History of adenomatous colon polyps-up-to-date with colonoscopy last done 2018. #7 diverticulosis  Plan; We reviewed a strict antireflux regimen, including n.p.o. for 2 hours prior to bedtime and elevation of the head of bed 45 degrees.  She was given an antireflux diet. Continue Nexium 40 mg  p.o. every morning, will add Pepcid 40 mg q. p.m. at least short-term to see if this offers any improvement in her symptoms. We will schedule for barium swallow. Have also made patient a follow-up appointment with Dr. Keturah Barre.  Jean Smolen Genia Harold PA-C 01/30/2020  Cc: Jean Peng, NP

## 2020-01-30 NOTE — Patient Instructions (Addendum)
Please follow antireflux diet. We have given you brochures/handouts regarding this. _______________________________________________________ Continue Nexium 40 mg every morning _________________________________________________________ We have sent the following medications to your pharmacy for you to pick up at your convenience: Pepcid 40 mg every night _________________________________________________________ Dennis Bast have been scheduled to see Dr Keturah Barre at [facility] on 02/26/20 at 11:00 am. _________________________________________________________  Dennis Bast have been scheduled for a Barium Esophogram at [facility] (1st floor of the hospital) on Wednesday 02/13/20 at 11:00 am. Please arrive 15 minutes prior to your appointment for registration. Make certain not to have anything to eat or drink 3 hours prior to your test. If you need to reschedule for any reason, please contact radiology at [phone] to do so. __________________________________________________________ A barium swallow is an examination that concentrates on views of the esophagus. This tends to be a double contrast exam (barium and two liquids which, when combined, create a gas to distend the wall of the oesophagus) or single contrast (non-ionic iodine based). The study is usually tailored to your symptoms so a good history is essential. Attention is paid during the study to the form, structure and configuration of the esophagus, looking for functional disorders (such as aspiration, dysphagia, achalasia, motility and reflux) EXAMINATION You may be asked to change into a gown, depending on the type of swallow being performed. A radiologist and radiographer will perform the procedure. The radiologist will advise you of the type of contrast selected for your procedure and direct you during the exam. You will be asked to stand, sit or lie in several different positions and to hold a small amount of fluid in your mouth  before being asked to swallow while the imaging is performed .In some instances you may be asked to swallow barium coated marshmallows to assess the motility of a solid food bolus. The exam can be recorded as a digital or video fluoroscopy procedure. POST PROCEDURE It will take 1-2 days for the barium to pass through your system. To facilitate this, it is important, unless otherwise directed, to increase your fluids for the next 24-48hrs and to resume your normal diet.  This test typically takes about 30 minutes to perform. __________________________________________________________ Your provider has requested that you have an chest x ray before leaving today. Please go to the basement floor to our Radiology department for the test. __________________________________________________________  If you are age 20 or older, your body mass index should be between 23-30. Your Body mass index is 35.26 kg/m. If this is out of the aforementioned range listed, please consider follow up with your Primary Care Provider.  If you are age 71 or younger, your body mass index should be between 19-25. Your Body mass index is 35.26 kg/m. If this is out of the aformentioned range listed, please consider follow up with your Primary Care Provider.   _________________________________________________________ Due to recent changes in healthcare laws, you may see the results of your imaging and laboratory studies on MyChart before your provider has had a chance to review them.  We understand that in some cases there may be results that are confusing or concerning to you. Not all laboratory results come back in the same time frame and the provider may be waiting for multiple results in order to interpret others.  Please give [name] 48 hours in order for your provider to thoroughly review all the results before contacting the office for clarification of your results.

## 2020-02-13 ENCOUNTER — Encounter: Payer: Self-pay | Admitting: Adult Health

## 2020-02-13 ENCOUNTER — Ambulatory Visit (HOSPITAL_COMMUNITY)
Admission: RE | Admit: 2020-02-13 | Discharge: 2020-02-13 | Disposition: A | Discharge status: Home / Self Care | Payer: Medicare HMO | Source: Ambulatory Visit | Attending: Physician Assistant | Admitting: Physician Assistant

## 2020-02-13 ENCOUNTER — Other Ambulatory Visit: Payer: Self-pay

## 2020-02-13 DIAGNOSIS — R05 Cough: Secondary | ICD-10-CM | POA: Diagnosis not present

## 2020-02-13 DIAGNOSIS — R053 Chronic cough: Secondary | ICD-10-CM

## 2020-02-15 ENCOUNTER — Other Ambulatory Visit: Payer: Self-pay | Admitting: Adult Health

## 2020-02-15 DIAGNOSIS — Z760 Encounter for issue of repeat prescription: Secondary | ICD-10-CM

## 2020-02-18 ENCOUNTER — Telehealth: Payer: Self-pay

## 2020-02-18 NOTE — Telephone Encounter (Signed)
Advised of this. Jean Nash would like the note to be forwarded to Jean Peng, NP to bring this to his attention. He would like to know Jean Nash's input. Patient agrees to this request as well. She states "so far so good, I have been off for up to 3 days with no problems."  I advised them the final decision is ultimately theirs to make and I would forward to the primary as requested.

## 2020-02-18 NOTE — Telephone Encounter (Signed)
Please let them know patients can still have chronic GERD even without a hiatal hernia- by my notes she has hx of chronic GERD, and had been on Nexium long term  for this . We added pepcid  in the evening at the time of her visit . If she has not had any improvement in cough she can stop the evening dose of pepcid but I would have her stay on Nexium , and see if Dr Annamaria Boots feels she has other issues causing cough

## 2020-02-18 NOTE — Telephone Encounter (Signed)
-----   Message from Alfredia Ferguson, PA-C sent at 02/18/2020 10:49 AM EDT ----- Please call patient let her know that the barium swallow did not show any evidence for a mass or stricture.  She did have signs of reflux but only to the midesophagus and they could not correlate episodes of coughing with visible reflux.I am not certain that her current cough is secondary to GERD.  Asked her to continue the current regimen and we will wait for upcoming pulmonary consult which is already scheduled.

## 2020-02-18 NOTE — Telephone Encounter (Signed)
Spoke with both the patient and her spouse on speaker phone.  They were very focused on the part of the imaging that say "no hiatal hernia." Also wants to "be sure everyone is on the same page" in reference to weaning off of the Nexium.  Patient and spouse say "Tommi Rumps said to wean off of the Nexium by taking it every other day, then every 2 days, then stop." I do see a message where the patient questioned the need to be on Nexium in the absence of a hiatal hernia.  To summarize, she wants to know if she should continue Nexium or wean off of it. She is continuing at this time, Pepcid at bedtime.

## 2020-02-18 NOTE — Telephone Encounter (Signed)
ARE THESE INSTRUCTIONS CORRECT?

## 2020-02-19 NOTE — Telephone Encounter (Signed)
If she is having no issues at this time then I am fine with her to continue

## 2020-02-19 NOTE — Telephone Encounter (Signed)
Sent to the pharmacy by e-scribe. 

## 2020-02-19 NOTE — Telephone Encounter (Signed)
Pt notified. Nothing further needed

## 2020-02-19 NOTE — Telephone Encounter (Signed)
She should only be taking one tab in the " middle of the night"

## 2020-02-26 ENCOUNTER — Ambulatory Visit (INDEPENDENT_AMBULATORY_CARE_PROVIDER_SITE_OTHER): Payer: Medicare HMO | Admitting: Internal Medicine

## 2020-02-26 ENCOUNTER — Encounter: Payer: Self-pay | Admitting: Internal Medicine

## 2020-02-26 ENCOUNTER — Other Ambulatory Visit: Payer: Self-pay

## 2020-02-26 DIAGNOSIS — J8410 Pulmonary fibrosis, unspecified: Secondary | ICD-10-CM

## 2020-02-26 NOTE — Patient Instructions (Signed)
Try using your benzonatate perles and otc Delsym cough syrup for cough as needed. Jean Nash diabetic candies also seem effective at calming cough for many people.  Order- schedule High Resolution CT chest    ILD protocol    Dx ILD  Please try to keep your weight down so the sleep apnea problem doesn't creep back   Please call if we can help

## 2020-02-26 NOTE — Progress Notes (Signed)
HPI female nonsmoker followed for OSA, insomnia, complicated by hypothyroid, asthma, GERD, psoriasis, CA L breast/ biMastectomy Unattended Home Sleep Test 03/06/2015- severe obstructive sleep apnea/hypopnea syndrome, AHI 44.9 per hour with desaturation to 74% and mean saturation only 89% on room air. Body weight 190 pounds   [place] 4/24  -----------------------------------------------------------------------------------  12/04/2018- 79 year old female nonsmoker followed for OSA, insomnia, complicated by hypothyroid, asthma, GERD, psoriasis, CA L breast CPAP auto 8-15/[facility] - dc 'd with weight loss Weight loss 188 lbs- now 159 lbs.  -----OSA: Pt is down in weight since last visit and states no issues with sleep-husband states she is sleepig well too.  She has dieted off significant weight.  Husband confirms she no longer snores at all and seems to sleep very nicely.  She feels rested. Breathing is comfortable with occasional very minor cough.  She attributes dyspnea on exertion to deconditioning. Wears a lymphedema sleeve on her right arm following breast cancer treatment.  02/26/20- 79 year old female former smoker followed for OSA, insomnia, complicated by hypothyroid, asthma, GERD, psoriasis, CA  Breast/ bilat mastectomy CPAP auto 8-15/[facility] - dc 'd with weight loss down from 188> 159 lbs Body weight today- 176 lbs -----=f/u pulmonary fibrosis/ OSA  CTa chest 02/26/18- had shown Mild right apical and right greater than left anterior subpleural fibrosis Not using CPAP after weight los. Told she snored at dentist, but husband says "not like before". Looks forward to pool opening so she can swim again to lose weight.  Some morning dry cough- reviewed CXR report. She had XRT as part of breast cancer Rx. Not much aware of GERD, but considered for potential contribution to fibrosis on CXR. We will be watching for progression.  CXR 01/30/20-  The cardiomediastinal contours are unchanged. Normal  heart size with mild aortic tortuosity. The lungs are clear. Pulmonary vasculature is normal. No consolidation, pleural effusion, or pneumothorax. No acute osseous abnormalities are seen. Surgical clips in the right greater than left axilla. IMPRESSION: No acute findings or explanation for cough.  BaSwallow 02/13/20- IMPRESSION: No mass or stricture. Spontaneous gastroesophageal reflux to the mid esophagus. Episodes of coughing during this study did not always coincide with visible reflux.   ROS-see HPI   + = positive Constitutional:   + diet weight loss, night sweats, fevers, chills, fatigue, lassitude. HEENT:    headaches, difficulty swallowing, +tooth/dental problems, sore throat,       sneezing, itching, ear ache, nasal congestion, post nasal drip, snoring CV:    chest pain, orthopnea, PND, swelling in lower extremities, anasarca,                                                dizziness, palpitations Resp:   +shortness of breath with exertion or at rest.                productive cough,  + non-productive cough, coughing up of blood.              change in color of mucus.  wheezing.   Skin:    rash or lesions. GI:     +heartburn, indigestion, abdominal pain, nausea, vomiting, diarrhea,                 change in bowel habits, loss of appetite GU: dysuria, change in color of urine, no urgency or frequency.   flank pain. MS:   +  joint pain, stiffness, decreased range of motion, back pain. Neuro-     nothing unusual Psych:  change in mood or affect.  depression or anxiety.   memory loss.  OBJ- Physical Exam General- Alert, Oriented, Affect-appropriate, Distress- none acute, + obese Skin- rash-none, lesions- none, excoriation- none Lymphadenopathy- none Head- atraumatic            Eyes- Gross vision intact, PERRLA, conjunctivae and secretions clear            Ears- Hearing, canals-normal            Nose- Clear, no-Septal dev, mucus, polyps, erosion, perforation             Throat-  Mallampati III-IV , mucosa clear , drainage- none, tonsils- atrophic Neck- flexible , trachea midline, no stridor , thyroid nl, carotid no bruit Chest - symmetrical excursion , unlabored           Heart/CV- RRR , no murmur , no gallop  , no rub, nl s1 s2                           - JVD- none , edema- none, stasis changes- none, varices- none           Lung- +trace basilar crackles,  wheeze- none, cough+dry , dullness-none, rub- none           Chest wall- + bilateral mastectomy Abd-  Br/ Gen/ Rectal- Not done, not indicated Extrem- cyanosis- none, clubbing, none, atrophy- none, strength- nl, + R arm elastic sleeve Neuro- grossly intact to observation

## 2020-02-27 ENCOUNTER — Other Ambulatory Visit: Payer: Self-pay | Admitting: Family Medicine

## 2020-02-27 DIAGNOSIS — E039 Hypothyroidism, unspecified: Secondary | ICD-10-CM

## 2020-02-28 NOTE — Assessment & Plan Note (Signed)
Plan to maintain reflux precautions

## 2020-02-28 NOTE — Assessment & Plan Note (Signed)
Suspect post-XRT fibrosis, but watching for progression Plan- HRCT ILD protocol

## 2020-03-04 NOTE — Progress Notes (Signed)
Virtual Visit via Video Note  I connected with Jean Nash on 03/10/20 at  1:00 PM EDT by a video enabled telemedicine application and verified that I am speaking with the correct person using two identifiers.   I discussed the limitations of evaluation and management by telemedicine and the availability of in person appointments. The patient expressed understanding and agreed to proceed.      I discussed the assessment and treatment plan with the patient. The patient was provided an opportunity to ask questions and all were answered. The patient agreed with the plan and demonstrated an understanding of the instructions.   The patient was advised to call back or seek an in-person evaluation if the symptoms worsen or if the condition fails to improve as anticipated.  I provided 10 minutes of non-face-to-face time during this encounter.   Norman Clay, MD    [facility] MD/PA/NP OP Progress Note  03/10/2020 2:12 PM [name]  MRN:  [id number]  Chief Complaint:  Chief Complaint    Depression; Follow-up     HPI:  This is a follow-up appointment for depression.  She states that her mood has been "strangely okay." She states that her mood has been good despite she has lower back pain. She had loss of her friend of 62 years. She used to call every time she is on machine for lymphedema. This friend is the one who gave the patient her husband's phone number.  She is looking forward to visiting her family in [place] in a few weeks.  She will also have visitation from her daughter and granddaughter in [place].  She denies insomnia.  She denies feeling depressed.  She has good energy and motivation.  She has a good appetite.  She denies SI.  She denies anxiety or panic attacks.   Visit Diagnosis:    ICD-10-CM   1. MDD (major depressive disorder), recurrent, in full remission ([place])  F33.42     Past Psychiatric History: Please see initial evaluation for full details. I have reviewed the  history. No updates at this time.     Past Medical History:  Past Medical History:  Diagnosis Date  . Adenomatous colon polyp   . Anxiety    PHOBIAS  . Arthritis   . Breast cancer ([place]) 08/08/13   right LOQ  . Cataract   . Chronic insomnia   . Cluster headaches    history of migraines / NONE FOR SEVERAL YRS  . Depression   . Diverticulosis   . Fatty liver 2011  . Fibromyalgia   . GERD (gastroesophageal reflux disease)   . H/O hiatal hernia   . History of transfusion 08/30/2013  . Hx of radiation therapy 10/29/13- 12/14/13   right chest wall 5040 cGy 28 sessions, right supraclavicular/axillary region 5040 cGy 28 sessions, right chest wall boost 1000 cGy 5 sessions  . Hypothyroidism   . Internal hemorrhoids   . Irritable bowel syndrome   . Kidney stone   . Lymphedema    RT ARM - WEARS SLEEVE  . Macular degeneration    hole/right eye  . MDD (major depressive disorder)   . Osteopenia   . Other abnormal glucose   . Other and unspecified hyperlipidemia   . Pain in joint, shoulder region   . Pneumonia [id number]  . Sleep apnea    USES C-PAP  . Stress incontinence, female     Past Surgical History:  Procedure Laterality Date  . ABDOMINAL HYSTERECTOMY    . APPENDECTOMY    .  BILATERAL TOTAL MASTECTOMY WITH AXILLARY LYMPH NODE DISSECTION  08/30/2013   Dr Barry Dienes  . BREAST CYST ASPIRATION     9 cysts  . CATARACT EXTRACTION, BILATERAL  2005/2007  . CHOLECYSTECTOMY    . COLONOSCOPY    . CYSTOCELE REPAIR    . EVACUATION BREAST HEMATOMA Left 08/31/2013   Procedure: EVACUATION HEMATOMA BREAST;  Surgeon: Stark Klein, MD;  Location: [place];  Service: General;  Laterality: Left;  . EYE SURGERY     to repair macular hole  . FOOT ARTHROPLASTY     lt   . GANGLION CYST EXCISION     rt foot  . HEMORRHOID SURGERY     03/1993  . JOINT REPLACEMENT  03/15/11   left knee replacement  . KNEE ARTHROSCOPY     /partial knee 2016/left knee 2012  . MASS EXCISION  11/04/2011   Procedure:  EXCISION MASS;  Surgeon: Cammie Sickle., MD;  Location: [place];  Service: Orthopedics;  Laterality: Right;  excisional biopsy right ulna mass  . MASTECTOMY W/ SENTINEL NODE BIOPSY Right 08/30/2013   Procedure: RIGHT  AXILLARY SENTINEL LYMPH NODE BIOPSY; Right Axillary Node Disection;  Surgeon: Stark Klein, MD;  Location: [place];  Service: General;  Laterality: Right;  Right side nuc med 7:00   . PARTIAL KNEE ARTHROPLASTY Right 11/03/2015   Procedure: RIGHT KNEE MEDIAL UNICOMPARTMENTAL ARTHROPLASTY ;  Surgeon: Gaynelle Arabian, MD;  Location: [facility];  Service: Orthopedics;  Laterality: Right;  . RECTOCELE REPAIR    . SIMPLE MASTECTOMY WITH AXILLARY SENTINEL NODE BIOPSY Left 08/30/2013   Procedure: Bilateral Breast Mastectomy ;  Surgeon: Stark Klein, MD;  Location: [place];  Service: General;  Laterality: Left;  . skin tags removed     breast, panty line, neckline  . TOE SURGERY     preventative crossover toe surg/right foot  . TOE SURGERY  2009   left foot/screw  in 2nd toe  . TONSILLECTOMY    . UPPER GASTROINTESTINAL ENDOSCOPY      Family Psychiatric History: Please see initial evaluation for full details. I have reviewed the history. No updates at this time.     Family History:  Family History  Problem Relation Age of Onset  . Stroke Mother        died age 64  . Diabetes Mother   . Breast cancer Mother 44  . Breast cancer Sister 38  . Breast cancer Paternal Aunt 14  . Diabetes Maternal Grandfather   . Breast cancer Paternal Grandmother 73  . Breast cancer Paternal Aunt        dx in her 35s  . Cancer Maternal Grandmother        intra-abdominal cancer  . Brain cancer Maternal Uncle 8  . Brain cancer Cousin 72       maternal cousin  . Brain cancer Cousin 20       paternal cousin  . Colon cancer Neg Hx     Social History:  Social History   Socioeconomic History  . Marital status: Married    Spouse name: Not on file  . Number of children: 3  . Years  of education: Not on file  . Highest education level: Not on file  Occupational History  . Occupation: retired bookkeeper  Tobacco Use  . Smoking status: Former Smoker    Packs/day: 0.10    Years: 2.00    Pack years: 0.20    Types: Cigarettes    Start date: 11/16/1959  Quit date: 11/15/1960    Years since quitting: 59.3  . Smokeless tobacco: Never Used  Substance and Sexual Activity  . Alcohol use: No    Alcohol/week: 0.0 standard drinks  . Drug use: No  . Sexual activity: Not on file    Comment: menarche age 35, fist live birth 20, P 3, hysterectomy age 89, no HRT, BCP 2 yrs  Other Topics Concern  . Not on file  Social History Narrative   Occupation:  Retired Radiation protection practitioner    Married with 3 grown children      Never Smoked     Alcohol use-no        Social Determinants of Radio broadcast assistant Strain:   . Difficulty of Paying Living Expenses:   Food Insecurity:   . Worried About Charity fundraiser in the Last Year:   . Arboriculturist in the Last Year:   Transportation Needs:   . Film/video editor (Medical):   Marland Kitchen Lack of Transportation (Non-Medical):   Physical Activity:   . Days of Exercise per Week:   . Minutes of Exercise per Session:   Stress:   . Feeling of Stress :   Social Connections:   . Frequency of Communication with Friends and Family:   . Frequency of Social Gatherings with Friends and Family:   . Attends Religious Services:   . Active Member of Clubs or Organizations:   . Attends Archivist Meetings:   Marland Kitchen Marital Status:     Allergies: No Known Allergies  Metabolic Disorder Labs: Lab Results  Component Value Date   HGBA1C 5.4 10/05/2019   MPG 126 (H) 08/27/2013   MPG 117 (H) 06/22/2013   Lab Results  Component Value Date   PROLACTIN 3.9 07/22/2009   Lab Results  Component Value Date   CHOL 202 (H) 11/15/2019   TRIG 109.0 11/15/2019   HDL 51.90 11/15/2019   CHOLHDL 4 11/15/2019   VLDL 21.8 11/15/2019   LDLCALC 128 (H)  11/15/2019   LDLCALC 96 07/20/2018   Lab Results  Component Value Date   TSH 1.46 11/15/2019   TSH 0.84 07/20/2018    Therapeutic Level Labs: No results found for: LITHIUM No results found for: VALPROATE No components found for:  CBMZ  Current Medications: Current Outpatient Medications  Medication Sig Dispense Refill  . butalbital-acetaminophen-caffeine (FIORICET) 50-325-40 MG tablet Take 1-2 tablets by mouth every 6 (six) hours as needed for headache. 20 tablet 1  . Calcium Carb-Cholecalciferol (CALCIUM-VITAMIN D) 500-400 MG-UNIT TABS Take by mouth 2 (two) times daily.     . cholecalciferol (VITAMIN D) 1000 units tablet Take 2,000 Units by mouth daily.    Marland Kitchen co-enzyme Q-10 30 MG capsule Take 300 mg by mouth daily.    . diazepam (VALIUM) 5 MG tablet TAKE 1 TO 2 TABLETS BY MOUTH AT BEDTIME. 30 tablet 0  . diclofenac sodium (VOLTAREN) 1 % GEL as needed.     Marland Kitchen esomeprazole (NEXIUM) 40 MG capsule Take 1 capsule by mouth  daily 365 capsule 0  . famotidine (PEPCID) 40 MG tablet Take 1 tablet (40 mg total) by mouth at bedtime. 90 tablet 3  . levothyroxine (SYNTHROID) 100 MCG tablet Take 1 tablet by mouth at night. 90 tablet 2  . LUTEIN PO Take by mouth.    . meloxicam (MOBIC) 7.5 MG tablet Take 1 tablet by mouth twice daily as needed for pain 180 tablet 0  . methocarbamol (ROBAXIN) 500 MG tablet  Take 500 mg by mouth as needed for muscle spasms.    . methylPREDNISolone (MEDROL DOSEPAK) 4 MG TBPK tablet Take as directed 21 tablet 0  . omega-3 acid ethyl esters (LOVAZA) 1 g capsule Take 1 capsule (1 g total) by mouth 2 (two) times daily. 180 capsule 2  . oxyCODONE-acetaminophen (PERCOCET) 5-325 MG tablet Take by mouth as needed for severe pain.    . polyethylene glycol (MIRALAX / GLYCOLAX) packet Take 17 g by mouth daily.    . pregabalin (LYRICA) 150 MG capsule Take 1 capsule (150 mg total) by mouth 2 (two) times daily. 180 capsule 1  . saccharomyces boulardii (FLORASTOR) 250 MG capsule Take  250 mg by mouth daily.    . simvastatin (ZOCOR) 10 MG tablet TAKE 1 TABLET BY MOUTH AT BEDTIME 90 tablet 0  . topiramate (TOPAMAX) 25 MG tablet Take 1 tablet (25 mg total) by mouth daily. 90 tablet 3  . traMADol (ULTRAM) 50 MG tablet Take 1 tablet (50 mg total) by mouth every 12 (twelve) hours as needed for moderate pain. 30 tablet 2  . TURMERIC PO Take 1,000 mg by mouth daily.    Marland Kitchen venlafaxine XR (EFFEXOR-XR) 37.5 MG 24 hr capsule Take 1 capsule (37.5 mg total) by mouth daily with breakfast. 90 capsule 1   No current facility-administered medications for this visit.     Musculoskeletal: Strength & Muscle Tone: N/A Gait & Station: N/A Patient leans: N/A  Psychiatric Specialty Exam: Review of Systems  Psychiatric/Behavioral: Negative for agitation, behavioral problems, confusion, decreased concentration, dysphoric mood, hallucinations, self-injury, sleep disturbance and suicidal ideas. The patient is not nervous/anxious and is not hyperactive.   All other systems reviewed and are negative.   There were no vitals taken for this visit.There is no height or weight on file to calculate BMI.  General Appearance: Fairly Groomed  Eye Contact:  Good  Speech:  Clear and Coherent  Volume:  Normal  Mood:  "good"  Affect:  Appropriate, Congruent and Full Range  Thought Process:  Coherent  Orientation:  Full (Time, Place, and Person)  Thought Content: Logical   Suicidal Thoughts:  No  Homicidal Thoughts:  No  Memory:  Immediate;   Good  Judgement:  Good  Insight:  Good  Psychomotor Activity:  Normal  Concentration:  Concentration: Good and Attention Span: Good  Recall:  Good  Fund of Knowledge: Good  Language: Good  Akathisia:  No  Handed:  Right  AIMS (if indicated): not done  Assets:  Communication Skills Desire for Improvement  ADL's:  Intact  Cognition: WNL  Sleep:  Good   Screenings: PHQ2-9     Office Visit from 07/20/2018 in [place] at [facility] from  07/14/2017 in [place] at [facility] from 11/28/2015 in [place] at [facility] from 10/14/2014 in [place] at [facility] from 10/01/2013 in [place] at [facility] Total Score  0  0  0  1  0  PHQ-9 Total Score  --  5  --  --  --       Assessment and Plan:  Junne Fessenden is a 79 y.o. year old female with a history of depression, anxiety,breast cancer s/p mastectomy  , who presents for follow up appointment for MDD (major depressive disorder), recurrent, in full remission [facility])   # MDD, recurrent in full, remission She denies significant mood symptoms since the last visit except the grief of loss of her friend  of over 50 years. Will continue current dose of venlafaxine as maintenance therapy for depression . Will consider tapering it off at the next appointment if she denies any mood symptoms.  Noted that she did have worsening in irritability when she tried to taper off venlafaxine, although it is difficult to discern whether it was more attributable to discontinuation symptoms. Of note, she is prescribed Valium by her PCP for insomnia; discussed risk of fall and oversedation/dependence.    Plan I have reviewed and updated plans as below 1. Continue venlafaxine 37.5 mg daily  2. Next appointment: 7/12 at 2:30 PM for 20 mins , [email or web address] - diazepam 5 mg prescribed by her PCP, on pregabalin,   The patient demonstrates the following risk factors for suicide: Chronic risk factors for suicide include:psychiatric disorder ofdepression. Acute risk factorsfor suicide include: N/A. Protective factorsfor this patient include: positive social support, coping skills and hope for the future. Considering these factors, the overall suicide risk at this point appears to below. Patientisappropriate for outpatient follow up.  Norman Clay, MD 03/10/2020, 2:12 PM

## 2020-03-05 ENCOUNTER — Encounter: Payer: Self-pay | Admitting: Adult Health

## 2020-03-06 ENCOUNTER — Other Ambulatory Visit: Payer: Self-pay

## 2020-03-06 ENCOUNTER — Encounter: Payer: Self-pay | Admitting: Adult Health

## 2020-03-07 ENCOUNTER — Encounter: Payer: Self-pay | Admitting: Adult Health

## 2020-03-07 ENCOUNTER — Ambulatory Visit (INDEPENDENT_AMBULATORY_CARE_PROVIDER_SITE_OTHER): Payer: Medicare HMO | Admitting: Adult Health

## 2020-03-07 ENCOUNTER — Telehealth: Payer: Self-pay | Admitting: Adult Health

## 2020-03-07 ENCOUNTER — Other Ambulatory Visit: Payer: Self-pay | Admitting: Adult Health

## 2020-03-07 VITALS — BP 120/78 | Temp 98.2°F | Wt 178.0 lb

## 2020-03-07 DIAGNOSIS — M545 Low back pain, unspecified: Secondary | ICD-10-CM

## 2020-03-07 MED ORDER — METHYLPREDNISOLONE 4 MG PO TBPK: ORAL_TABLET | ORAL | 0 refills | Status: DC

## 2020-03-07 MED ORDER — METHYLPREDNISOLONE ACETATE 80 MG/ML IJ SUSP
80.0000 mg | Freq: Once | INTRAMUSCULAR | Status: AC
  Administered 2020-03-07: 80 mg via INTRAMUSCULAR

## 2020-03-07 MED ORDER — DIAZEPAM 5 MG PO TABS: ORAL_TABLET | ORAL | 0 refills | Status: DC

## 2020-03-07 MED ORDER — KETOROLAC TROMETHAMINE 60 MG/2ML IM SOLN
60.0000 mg | Freq: Once | INTRAMUSCULAR | Status: AC
  Administered 2020-03-07: 60 mg via INTRAMUSCULAR

## 2020-03-07 NOTE — Telephone Encounter (Signed)
CORY, PLEASE RESEND WITH ONE SIG.  LAST SENT WITH 2 DIFFERENT DIRECTIONS.  I HAVE FIXED THIS FOR YOU.

## 2020-03-07 NOTE — Telephone Encounter (Signed)
Jean Nash from [place] would like direction clarification for Diazepam.

## 2020-03-07 NOTE — Progress Notes (Signed)
Subjective:    [name], female    DOB: 1941-09-19, 79 y.o.   MRN: [id number]  HPI 79 year old female who  has a past medical history of Adenomatous colon polyp, Anxiety, Arthritis, Breast cancer ([facility]) (08/08/13), Cataract, Chronic insomnia, Cluster headaches, Depression, Diverticulosis, Fatty liver (2011), Fibromyalgia, GERD (gastroesophageal reflux disease), H/O hiatal hernia, History of transfusion (08/30/2013), radiation therapy (10/29/13- 12/14/13), Hypothyroidism, Internal hemorrhoids, Irritable bowel syndrome, Kidney stone, Lymphedema, Macular degeneration, MDD (major depressive disorder), Osteopenia, Other abnormal glucose, Other and unspecified hyperlipidemia, Pain in joint, shoulder region, Pneumonia [id number]), Sleep apnea, and Stress incontinence, female.  She is being evaluated today for low back pain. Her pain started three days ago. She reports that the pain was present when she woke up in the morning and has had severe pain since. The day the pain started she took 100 mg Tramadol in the morning and 50 mg in the evening - at my direction over mychart - she reports that this did not help with her pain. The next day she took a dose of Valium , again under my direction - the valium helped and she was able to sleep.   She denies radiating pain or issues with bowel or bladder.   The day before the pain started she was doing a lot of bending over but no strenuous exercises.    Review of Systems See HPI   Past Medical History:  Diagnosis Date  . Adenomatous colon polyp   . Anxiety    PHOBIAS  . Arthritis   . Breast cancer ([place]) 08/08/13   right LOQ  . Cataract   . Chronic insomnia   . Cluster headaches    history of migraines / NONE FOR SEVERAL YRS  . Depression   . Diverticulosis   . Fatty liver 2011  . Fibromyalgia   . GERD (gastroesophageal reflux disease)   . H/O hiatal hernia   . History of transfusion 08/30/2013  . Hx of radiation therapy 10/29/13-  12/14/13   right chest wall 5040 cGy 28 sessions, right supraclavicular/axillary region 5040 cGy 28 sessions, right chest wall boost 1000 cGy 5 sessions  . Hypothyroidism   . Internal hemorrhoids   . Irritable bowel syndrome   . Kidney stone   . Lymphedema    RT ARM - WEARS SLEEVE  . Macular degeneration    hole/right eye  . MDD (major depressive disorder)   . Osteopenia   . Other abnormal glucose   . Other and unspecified hyperlipidemia   . Pain in joint, shoulder region   . Pneumonia [id number]  . Sleep apnea    USES C-PAP  . Stress incontinence, female     Social History   Socioeconomic History  . Marital status: Married    Spouse name: Not on file  . Number of children: 3  . Years of education: Not on file  . Highest education level: Not on file  Occupational History  . Occupation: retired bookkeeper  Tobacco Use  . Smoking status: Former Smoker    Packs/day: 0.10    Years: 2.00    Pack years: 0.20    Types: Cigarettes    Start date: 11/16/1959    Quit date: 11/15/1960    Years since quitting: 59.3  . Smokeless tobacco: Never Used  Substance and Sexual Activity  . Alcohol use: No    Alcohol/week: 0.0 standard drinks  . Drug use: No  . Sexual activity: Not on file  Comment: menarche age 87, fist live birth 89, P 3, hysterectomy age 61, no HRT, BCP 2 yrs  Other Topics Concern  . Not on file  Social History Narrative   Occupation:  Retired Radiation protection practitioner    Married with 3 grown children      Never Smoked     Alcohol use-no        Social Determinants of Radio broadcast assistant Strain:   . Difficulty of Paying Living Expenses:   Food Insecurity:   . Worried About Charity fundraiser in the Last Year:   . Arboriculturist in the Last Year:   Transportation Needs:   . Film/video editor (Medical):   Marland Kitchen Lack of Transportation (Non-Medical):   Physical Activity:   . Days of Exercise per Week:   . Minutes of Exercise per Session:   Stress:   . Feeling of  Stress :   Social Connections:   . Frequency of Communication with Friends and Family:   . Frequency of Social Gatherings with Friends and Family:   . Attends Religious Services:   . Active Member of Clubs or Organizations:   . Attends Archivist Meetings:   Marland Kitchen Marital Status:   Intimate Partner Violence:   . Fear of Current or Ex-Partner:   . Emotionally Abused:   Marland Kitchen Physically Abused:   . Sexually Abused:     Past Surgical History:  Procedure Laterality Date  . ABDOMINAL HYSTERECTOMY    . APPENDECTOMY    . BILATERAL TOTAL MASTECTOMY WITH AXILLARY LYMPH NODE DISSECTION  08/30/2013   Dr Barry Dienes  . BREAST CYST ASPIRATION     9 cysts  . CATARACT EXTRACTION, BILATERAL  2005/2007  . CHOLECYSTECTOMY    . COLONOSCOPY    . CYSTOCELE REPAIR    . EVACUATION BREAST HEMATOMA Left 08/31/2013   Procedure: EVACUATION HEMATOMA BREAST;  Surgeon: Stark Klein, MD;  Location: [facility];  Service: General;  Laterality: Left;  . EYE SURGERY     to repair macular hole  . FOOT ARTHROPLASTY     lt   . GANGLION CYST EXCISION     rt foot  . HEMORRHOID SURGERY     03/1993  . JOINT REPLACEMENT  03/15/11   left knee replacement  . KNEE ARTHROSCOPY     /partial knee 2016/left knee 2012  . MASS EXCISION  11/04/2011   Procedure: EXCISION MASS;  Surgeon: Cammie Sickle., MD;  Location: [facility];  Service: Orthopedics;  Laterality: Right;  excisional biopsy right ulna mass  . MASTECTOMY W/ SENTINEL NODE BIOPSY Right 08/30/2013   Procedure: RIGHT  AXILLARY SENTINEL LYMPH NODE BIOPSY; Right Axillary Node Disection;  Surgeon: Stark Klein, MD;  Location: [facility];  Service: General;  Laterality: Right;  Right side nuc med 7:00   . PARTIAL KNEE ARTHROPLASTY Right 11/03/2015   Procedure: RIGHT KNEE MEDIAL UNICOMPARTMENTAL ARTHROPLASTY ;  Surgeon: Gaynelle Arabian, MD;  Location: [facility];  Service: Orthopedics;  Laterality: Right;  . RECTOCELE REPAIR    . SIMPLE MASTECTOMY WITH AXILLARY  SENTINEL NODE BIOPSY Left 08/30/2013   Procedure: Bilateral Breast Mastectomy ;  Surgeon: Stark Klein, MD;  Location: [facility];  Service: General;  Laterality: Left;  . skin tags removed     breast, panty line, neckline  . TOE SURGERY     preventative crossover toe surg/right foot  . TOE SURGERY  2009   left foot/screw  in 2nd toe  .  TONSILLECTOMY    . UPPER GASTROINTESTINAL ENDOSCOPY      Family History  Problem Relation Age of Onset  . Stroke Mother        died age 44  . Diabetes Mother   . Breast cancer Mother 3  . Breast cancer Sister 64  . Breast cancer Paternal Aunt 1  . Diabetes Maternal Grandfather   . Breast cancer Paternal Grandmother 11  . Breast cancer Paternal Aunt        dx in her 36s  . Cancer Maternal Grandmother        intra-abdominal cancer  . Brain cancer Maternal Uncle 8  . Brain cancer Cousin 91       maternal cousin  . Brain cancer Cousin 20       paternal cousin  . Colon cancer Neg Hx     No Known Allergies  Current Outpatient Medications on File Prior to Visit  Medication Sig Dispense Refill  . butalbital-acetaminophen-caffeine (FIORICET) 50-325-40 MG tablet Take 1-2 tablets by mouth every 6 (six) hours as needed for headache. 20 tablet 1  . Calcium Carb-Cholecalciferol (CALCIUM-VITAMIN D) 500-400 MG-UNIT TABS Take by mouth 2 (two) times daily.     . cholecalciferol (VITAMIN D) 1000 units tablet Take 2,000 Units by mouth daily.    Marland Kitchen co-enzyme Q-10 30 MG capsule Take 300 mg by mouth daily.    . diazepam (VALIUM) 5 MG tablet TAKE 1 TO 2 TABLETS BY MOUTH AT BEDTIME. TAKE 1 OR 2 PILLS 1 HOUR PRIOR TO DENTAL APPOINTMENT    . diclofenac sodium (VOLTAREN) 1 % GEL as needed.     Marland Kitchen esomeprazole (NEXIUM) 40 MG capsule Take 1 capsule by mouth  daily 365 capsule 0  . famotidine (PEPCID) 40 MG tablet Take 1 tablet (40 mg total) by mouth at bedtime. 90 tablet 3  . levothyroxine (SYNTHROID) 100 MCG tablet Take 1 tablet by mouth at night. 90 tablet 2  . LUTEIN  PO Take by mouth.    . meloxicam (MOBIC) 7.5 MG tablet Take 1 tablet by mouth twice daily as needed for pain 180 tablet 0  . methocarbamol (ROBAXIN) 500 MG tablet Take 500 mg by mouth as needed for muscle spasms.    Marland Kitchen omega-3 acid ethyl esters (LOVAZA) 1 g capsule Take 1 capsule (1 g total) by mouth 2 (two) times daily. 180 capsule 2  . oxyCODONE-acetaminophen (PERCOCET) 5-325 MG tablet Take by mouth as needed for severe pain.    . polyethylene glycol (MIRALAX / GLYCOLAX) packet Take 17 g by mouth daily.    . pregabalin (LYRICA) 150 MG capsule Take 1 capsule (150 mg total) by mouth 2 (two) times daily. 180 capsule 1  . saccharomyces boulardii (FLORASTOR) 250 MG capsule Take 250 mg by mouth daily.    . simvastatin (ZOCOR) 10 MG tablet TAKE 1 TABLET BY MOUTH AT BEDTIME 90 tablet 0  . topiramate (TOPAMAX) 25 MG tablet Take 1 tablet (25 mg total) by mouth daily. 90 tablet 3  . traMADol (ULTRAM) 50 MG tablet Take 1 tablet (50 mg total) by mouth every 12 (twelve) hours as needed for moderate pain. 30 tablet 2  . TURMERIC PO Take 1,000 mg by mouth daily.    Marland Kitchen venlafaxine XR (EFFEXOR-XR) 37.5 MG 24 hr capsule Take 1 capsule (37.5 mg total) by mouth daily with breakfast. 90 capsule 1   No current facility-administered medications on file prior to visit.    BP 120/78   Temp 98.2 F (  36.8 C)   Wt 178 lb (80.7 kg)   BMI 35.95 kg/m       Objective:   Physical Exam Vitals and nursing note reviewed.  Constitutional:      Appearance: Normal appearance.  Musculoskeletal:        General: Tenderness present.     Lumbar back: Tenderness present. No bony tenderness. Decreased range of motion.       Back:  Skin:    General: Skin is warm and dry.  Neurological:     General: No focal deficit present.     Mental Status: She is alert and oriented to person, place, and time.  Psychiatric:        Mood and Affect: Mood normal.        Behavior: Behavior normal.        Thought Content: Thought content  normal.        Judgment: Judgment normal.       Assessment & Plan:  1. Acute bilateral low back pain without sciatica -Toradol and Depo-Medrol injection given in the office today.  We will also prescribe Medrol Dosepak for the next 6 days.  She was advised to take prescribed Valium 5 mg nightly over the weekend.  If no improvement by early next week then we will order an MRI. - ketorolac (TORADOL) injection 60 mg - methylPREDNISolone acetate (DEPO-MEDROL) injection 80 mg - methylPREDNISolone (MEDROL DOSEPAK) 4 MG TBPK tablet; Take as directed  Dispense: 21 tablet; Refill: 0  [name], NP

## 2020-03-07 NOTE — Patient Instructions (Signed)
I think you have either a muscle strain or pinched nerve.   We gave you a injection of toradol and depo medrol to help with inflammation   I am going to prescribe you a medrol dose pack as well   You can take your Valium at night over the weekend

## 2020-03-10 ENCOUNTER — Encounter (HOSPITAL_COMMUNITY): Payer: Self-pay | Admitting: Psychiatry

## 2020-03-10 ENCOUNTER — Encounter (HOSPITAL_COMMUNITY): Payer: Self-pay

## 2020-03-10 ENCOUNTER — Telehealth (INDEPENDENT_AMBULATORY_CARE_PROVIDER_SITE_OTHER): Payer: Medicare HMO | Admitting: Psychiatry

## 2020-03-10 ENCOUNTER — Other Ambulatory Visit: Payer: Self-pay

## 2020-03-10 DIAGNOSIS — F3342 Major depressive disorder, recurrent, in full remission: Secondary | ICD-10-CM | POA: Diagnosis not present

## 2020-03-10 NOTE — Patient Instructions (Signed)
1. Continue venlafaxine 37.5 mg daily  2. Next appointment: 7/12 at 2:30 PM

## 2020-03-11 ENCOUNTER — Telehealth (HOSPITAL_COMMUNITY): Payer: Self-pay | Admitting: *Deleted

## 2020-03-11 NOTE — Telephone Encounter (Signed)
Patient's husband called & stated refill for venlafaxine XR (EFFEXOR-XR) 37.5 MG 24 hr capsule Isn't @ Rx listed in [facility] the [facility] & it was mentioned @ visit to Continue venlafaxine 37.5 mg daily

## 2020-03-12 ENCOUNTER — Encounter: Payer: Self-pay | Admitting: Adult Health

## 2020-03-12 ENCOUNTER — Other Ambulatory Visit (HOSPITAL_COMMUNITY): Payer: Self-pay | Admitting: Psychiatry

## 2020-03-12 DIAGNOSIS — F3342 Major depressive disorder, recurrent, in full remission: Secondary | ICD-10-CM

## 2020-03-12 MED ORDER — VENLAFAXINE HCL ER 37.5 MG PO CP24: 37.5000 mg | ORAL_CAPSULE | Freq: Every day | ORAL | 0 refills | Status: DC

## 2020-03-12 NOTE — Telephone Encounter (Signed)
Ordered

## 2020-03-27 ENCOUNTER — Encounter: Payer: Self-pay | Admitting: Adult Health

## 2020-03-27 ENCOUNTER — Other Ambulatory Visit: Payer: Self-pay | Admitting: Adult Health

## 2020-03-27 DIAGNOSIS — Z760 Encounter for issue of repeat prescription: Secondary | ICD-10-CM

## 2020-03-27 DIAGNOSIS — Z8669 Personal history of other diseases of the nervous system and sense organs: Secondary | ICD-10-CM

## 2020-03-28 ENCOUNTER — Encounter: Payer: Self-pay | Admitting: Adult Health

## 2020-03-28 MED ORDER — SIMVASTATIN 10 MG PO TABS: 10.0000 mg | ORAL_TABLET | Freq: Every day | ORAL | 0 refills | Status: DC

## 2020-03-28 MED ORDER — TOPIRAMATE 25 MG PO TABS: 25.0000 mg | ORAL_TABLET | Freq: Every day | ORAL | 0 refills | Status: DC

## 2020-03-28 NOTE — Telephone Encounter (Signed)
Spoke to pt and she stated she has been super fatigue. I advised that Jean Nash was out of the office now and Monday and see should seek medical help at local urgent care. Pt and spouse declined and stated that it was not an emergency they just wanted to know if some of the pills could be held that may be causing sleepiness. Pt also declined making a visit for next week. Pt stated they will be traveling for the next 2 weeks. I offered a vv or call and that was declined as welll.

## 2020-04-08 ENCOUNTER — Encounter: Payer: Self-pay | Admitting: Adult Health

## 2020-04-09 ENCOUNTER — Emergency Department (HOSPITAL_BASED_OUTPATIENT_CLINIC_OR_DEPARTMENT_OTHER): Payer: Medicare HMO

## 2020-04-09 ENCOUNTER — Encounter (HOSPITAL_BASED_OUTPATIENT_CLINIC_OR_DEPARTMENT_OTHER): Payer: Self-pay | Admitting: *Deleted

## 2020-04-09 ENCOUNTER — Encounter: Payer: Self-pay | Admitting: Adult Health

## 2020-04-09 ENCOUNTER — Other Ambulatory Visit: Payer: Self-pay

## 2020-04-09 ENCOUNTER — Emergency Department (HOSPITAL_BASED_OUTPATIENT_CLINIC_OR_DEPARTMENT_OTHER)
Admission: EM | Admit: 2020-04-09 | Discharge: 2020-04-09 | Disposition: A | Discharge status: Home / Self Care | Payer: Medicare HMO | Attending: Emergency Medicine | Admitting: Emergency Medicine

## 2020-04-09 DIAGNOSIS — E039 Hypothyroidism, unspecified: Secondary | ICD-10-CM | POA: Diagnosis not present

## 2020-04-09 DIAGNOSIS — Z79899 Other long term (current) drug therapy: Secondary | ICD-10-CM | POA: Diagnosis not present

## 2020-04-09 DIAGNOSIS — Z96662 Presence of left artificial ankle joint: Secondary | ICD-10-CM | POA: Insufficient documentation

## 2020-04-09 DIAGNOSIS — K2900 Acute gastritis without bleeding: Secondary | ICD-10-CM | POA: Diagnosis not present

## 2020-04-09 DIAGNOSIS — K5900 Constipation, unspecified: Secondary | ICD-10-CM | POA: Diagnosis present

## 2020-04-09 DIAGNOSIS — Z96651 Presence of right artificial knee joint: Secondary | ICD-10-CM | POA: Insufficient documentation

## 2020-04-09 DIAGNOSIS — Z96652 Presence of left artificial knee joint: Secondary | ICD-10-CM | POA: Diagnosis not present

## 2020-04-09 DIAGNOSIS — Z87891 Personal history of nicotine dependence: Secondary | ICD-10-CM | POA: Insufficient documentation

## 2020-04-09 DIAGNOSIS — R630 Anorexia: Secondary | ICD-10-CM | POA: Insufficient documentation

## 2020-04-09 DIAGNOSIS — R1033 Periumbilical pain: Secondary | ICD-10-CM | POA: Diagnosis not present

## 2020-04-09 DIAGNOSIS — Z853 Personal history of malignant neoplasm of breast: Secondary | ICD-10-CM | POA: Insufficient documentation

## 2020-04-09 DIAGNOSIS — Z923 Personal history of irradiation: Secondary | ICD-10-CM | POA: Diagnosis not present

## 2020-04-09 LAB — CBC WITH DIFFERENTIAL/PLATELET
Abs Immature Granulocytes: 0.02 10*3/uL (ref 0.00–0.07)
Basophils Absolute: 0 10*3/uL (ref 0.0–0.1)
Basophils Relative: 0 %
Eosinophils Absolute: 0.2 10*3/uL (ref 0.0–0.5)
Eosinophils Relative: 3 %
HCT: 47.8 % — ABNORMAL HIGH (ref 36.0–46.0)
Hemoglobin: 16.3 g/dL — ABNORMAL HIGH (ref 12.0–15.0)
Immature Granulocytes: 0 %
Lymphocytes Relative: 27 %
Lymphs Abs: 1.9 10*3/uL (ref 0.7–4.0)
MCH: 30.5 pg (ref 26.0–34.0)
MCHC: 34.1 g/dL (ref 30.0–36.0)
MCV: 89.3 fL (ref 80.0–100.0)
Monocytes Absolute: 0.5 10*3/uL (ref 0.1–1.0)
Monocytes Relative: 8 %
Neutro Abs: 4.3 10*3/uL (ref 1.7–7.7)
Neutrophils Relative %: 62 %
Platelets: 169 10*3/uL (ref 150–400)
RBC: 5.35 MIL/uL — ABNORMAL HIGH (ref 3.87–5.11)
RDW: 13.3 % (ref 11.5–15.5)
WBC: 6.9 10*3/uL (ref 4.0–10.5)
nRBC: 0 % (ref 0.0–0.2)

## 2020-04-09 LAB — URINALYSIS, MICROSCOPIC (REFLEX): WBC, UA: NONE SEEN WBC/hpf (ref 0–5)

## 2020-04-09 LAB — LIPASE, BLOOD: Lipase: 22 U/L (ref 11–51)

## 2020-04-09 LAB — URINALYSIS, ROUTINE W REFLEX MICROSCOPIC
Bilirubin Urine: NEGATIVE
Glucose, UA: NEGATIVE mg/dL
Ketones, ur: 15 mg/dL — AB
Leukocytes,Ua: NEGATIVE
Nitrite: NEGATIVE
Protein, ur: NEGATIVE mg/dL
Specific Gravity, Urine: 1.015 (ref 1.005–1.030)
pH: 6 (ref 5.0–8.0)

## 2020-04-09 LAB — COMPREHENSIVE METABOLIC PANEL
ALT: 23 U/L (ref 0–44)
AST: 22 U/L (ref 15–41)
Albumin: 4.2 g/dL (ref 3.5–5.0)
Alkaline Phosphatase: 56 U/L (ref 38–126)
Anion gap: 12 (ref 5–15)
BUN: 13 mg/dL (ref 8–23)
CO2: 23 mmol/L (ref 22–32)
Calcium: 9.3 mg/dL (ref 8.9–10.3)
Chloride: 106 mmol/L (ref 98–111)
Creatinine, Ser: 0.66 mg/dL (ref 0.44–1.00)
GFR calc Af Amer: >60 mL/min (ref >60)
GFR calc non Af Amer: >60 mL/min (ref >60)
Glucose, Bld: 93 mg/dL (ref 70–99)
Potassium: 4.2 mmol/L (ref 3.5–5.1)
Sodium: 141 mmol/L (ref 135–145)
Total Bilirubin: 0.7 mg/dL (ref 0.3–1.2)
Total Protein: 6.9 g/dL (ref 6.5–8.1)

## 2020-04-09 LAB — OCCULT BLOOD X 1 CARD TO LAB, STOOL: Fecal Occult Bld: NEGATIVE

## 2020-04-09 MED ORDER — IOHEXOL 300 MG/ML  SOLN
100.0000 mL | Freq: Once | INTRAMUSCULAR | Status: AC | PRN
  Administered 2020-04-09: 100 mL via INTRAVENOUS

## 2020-04-09 MED ORDER — SODIUM CHLORIDE 0.9 % IV SOLN: INTRAVENOUS | Status: DC

## 2020-04-09 NOTE — ED Notes (Signed)
Pt ambulated to bathroom without difficulty.

## 2020-04-09 NOTE — ED Triage Notes (Signed)
Abdominal pain for awhile.  Has been taking Nexium and stopped this medicine per PCP.

## 2020-04-09 NOTE — ED Provider Notes (Signed)
[facility] Provider Note   CSN: [id number] Arrival date & time: 04/09/20  1746     History Chief Complaint  Patient presents with  . Abdominal Pain    Jean Nash is a 79 y.o. female.  Patient is a 79 year old female with a history of adenomatous colon polyps, diverticulosis, GERD and hiatal hernia who had been on Nexium for years and discontinued 2 months ago and hypothyroidism presenting today with several complaints.  Patient's husband gives majority of the history.  He reports for the last week she has been struggling with intermittent constipation and for the last month has intermittently complained of abdominal pain but nothing that seems persistent.  However this morning since she woke up she has been having pain in her abdomen around the umbilicus.  She stated it became worse after eating breakfast this morning but is now starting to ease up and improved but is still persistent.  She also noticed a dark tarry stool this afternoon which she has never had before.  She does take meloxicam and NSAIDs regularly for her arthritis but has never had history of GI bleeding.  She does not take anticoagulants.  Patient reports she is overdue for her next colonoscopy but on the last one she had polyps that required removal.  She did start the Nexium yesterday per her doctor's request but has not noticed improvement thus far.  She denies any fever, cough, congestion, shortness of breath.  She has no chest pain.  The history is provided by the patient and the spouse.  Abdominal Pain Pain location:  Periumbilical Pain quality: aching and cramping   Pain radiates to:  Does not radiate Pain severity:  Moderate Onset quality:  Sudden Duration:  1 day Timing:  Constant Progression:  Waxing and waning Chronicity:  Recurrent Context comment:  Husband reports that she is complained of occasional intermittent pain for some time maybe over a month but today the pain  has been constant which is different and she had 1 dark tarry stool. Relieved by:  None tried Worsened by:  Eating Ineffective treatments:  None tried Associated symptoms: anorexia and constipation   Associated symptoms: no cough, no diarrhea, no fever, no nausea, no shortness of breath and no vomiting   Risk factors: being elderly        Past Medical History:  Diagnosis Date  . Adenomatous colon polyp   . Anxiety    PHOBIAS  . Arthritis   . Breast cancer ([place]) 08/08/13   right LOQ  . Cataract   . Chronic insomnia   . Cluster headaches    history of migraines / NONE FOR SEVERAL YRS  . Depression   . Diverticulosis   . Fatty liver 2011  . Fibromyalgia   . GERD (gastroesophageal reflux disease)   . H/O hiatal hernia   . History of transfusion 08/30/2013  . Hx of radiation therapy 10/29/13- 12/14/13   right chest wall 5040 cGy 28 sessions, right supraclavicular/axillary region 5040 cGy 28 sessions, right chest wall boost 1000 cGy 5 sessions  . Hypothyroidism   . Internal hemorrhoids   . Irritable bowel syndrome   . Kidney stone   . Lymphedema    RT ARM - WEARS SLEEVE  . Macular degeneration    hole/right eye  . MDD (major depressive disorder)   . Osteopenia   . Other abnormal glucose   . Other and unspecified hyperlipidemia   . Pain in joint, shoulder region   .  Pneumonia [id number]  . Sleep apnea    USES C-PAP  . Stress incontinence, female     Patient Active Problem List   Diagnosis Date Noted  . MDD (major depressive disorder), recurrent, in full remission ([place]) 12/10/2019  . Pulmonary fibrosis ([place]) 12/04/2018  . MDD (major depressive disorder), recurrent episode, mild ([place]) 12/22/2017  . Osteoporosis 07/14/2017  . OA (osteoarthritis) of knee 11/03/2015  . Osteopenia 08/08/2015  . Headache 02/03/2015  . Atypical chest pain 07/15/2014  . Arthralgia 02/22/2014  . Psoriasis 02/22/2014  . Malignant neoplasm of lower-outer quadrant of right breast of female,  estrogen receptor positive ([place]) 08/09/2013  . Neck pain 06/22/2013  . Left knee pain 11/29/2012  . Reactive depression (situational) 04/26/2012  . Right wrist pain 02/02/2012  . Right foot pain 10/04/2011  . Nevus 06/05/2011  . Status post total knee replacement 04/06/2011  . Overactive bladder 03/14/2011  . KNEE PAIN, LEFT 08/31/2010  . HEARING LOSS 08/17/2010  . HIRSUTISM 06/16/2009  . CYST, IDIOPATHIC 05/20/2008  . IRRITABLE BOWEL SYNDROME 04/15/2008  . Allergic asthma, mild intermittent, uncomplicated [id number]  . GERD 03/19/2008  . COLONIC POLYPS, HX OF 03/19/2008  . NEPHROLITHIASIS, HX OF 03/19/2008  . Hypothyroidism 03/18/2008  . Hyperlipidemia 03/18/2008  . INSOMNIA, CHRONIC 03/18/2008  . FIBROMYALGIA 03/18/2008  . Prediabetes 03/18/2008    Past Surgical History:  Procedure Laterality Date  . ABDOMINAL HYSTERECTOMY    . APPENDECTOMY    . BILATERAL TOTAL MASTECTOMY WITH AXILLARY LYMPH NODE DISSECTION  08/30/2013   Dr Barry Dienes  . BREAST CYST ASPIRATION     9 cysts  . CATARACT EXTRACTION, BILATERAL  2005/2007  . CHOLECYSTECTOMY    . COLONOSCOPY    . CYSTOCELE REPAIR    . EVACUATION BREAST HEMATOMA Left 08/31/2013   Procedure: EVACUATION HEMATOMA BREAST;  Surgeon: Stark Klein, MD;  Location: [facility];  Service: General;  Laterality: Left;  . EYE SURGERY     to repair macular hole  . FOOT ARTHROPLASTY     lt   . GANGLION CYST EXCISION     rt foot  . HEMORRHOID SURGERY     03/1993  . JOINT REPLACEMENT  03/15/11   left knee replacement  . KNEE ARTHROSCOPY     /partial knee 2016/left knee 2012  . MASS EXCISION  11/04/2011   Procedure: EXCISION MASS;  Surgeon: Cammie Sickle., MD;  Location: [facility];  Service: Orthopedics;  Laterality: Right;  excisional biopsy right ulna mass  . MASTECTOMY W/ SENTINEL NODE BIOPSY Right 08/30/2013   Procedure: RIGHT  AXILLARY SENTINEL LYMPH NODE BIOPSY; Right Axillary Node Disection;  Surgeon: Stark Klein, MD;   Location: [facility];  Service: General;  Laterality: Right;  Right side nuc med 7:00   . PARTIAL KNEE ARTHROPLASTY Right 11/03/2015   Procedure: RIGHT KNEE MEDIAL UNICOMPARTMENTAL ARTHROPLASTY ;  Surgeon: Gaynelle Arabian, MD;  Location: [facility];  Service: Orthopedics;  Laterality: Right;  . RECTOCELE REPAIR    . SIMPLE MASTECTOMY WITH AXILLARY SENTINEL NODE BIOPSY Left 08/30/2013   Procedure: Bilateral Breast Mastectomy ;  Surgeon: Stark Klein, MD;  Location: [facility];  Service: General;  Laterality: Left;  . skin tags removed     breast, panty line, neckline  . TOE SURGERY     preventative crossover toe surg/right foot  . TOE SURGERY  2009   left foot/screw  in 2nd toe  . TONSILLECTOMY    . UPPER GASTROINTESTINAL ENDOSCOPY  OB History    Gravida  4   Para  3   Term      Preterm      AB  1   Living        SAB      TAB      Ectopic      Multiple      Live Births              Family History  Problem Relation Age of Onset  . Stroke Mother        died age 48  . Diabetes Mother   . Breast cancer Mother 22  . Breast cancer Sister 38  . Breast cancer Paternal Aunt 21  . Diabetes Maternal Grandfather   . Breast cancer Paternal Grandmother 36  . Breast cancer Paternal Aunt        dx in her 53s  . Cancer Maternal Grandmother        intra-abdominal cancer  . Brain cancer Maternal Uncle 8  . Brain cancer Cousin 38       maternal cousin  . Brain cancer Cousin 20       paternal cousin  . Colon cancer Neg Hx     Social History   Tobacco Use  . Smoking status: Former Smoker    Packs/day: 0.10    Years: 2.00    Pack years: 0.20    Types: Cigarettes    Start date: 11/16/1959    Quit date: 11/15/1960    Years since quitting: 59.4  . Smokeless tobacco: Never Used  Substance Use Topics  . Alcohol use: No    Alcohol/week: 0.0 standard drinks  . Drug use: No    Home Medications Prior to Admission medications   Medication Sig Start Date End Date Taking?  Authorizing Provider  butalbital-acetaminophen-caffeine (FIORICET) 50-325-40 MG tablet Take 1-2 tablets by mouth every 6 (six) hours as needed for headache. 07/17/19 07/16/20 Yes Nafziger, Tommi Rumps, NP  Calcium Carb-Cholecalciferol (CALCIUM-VITAMIN D) 500-400 MG-UNIT TABS Take by mouth 2 (two) times daily.    Yes [provider]  cholecalciferol (VITAMIN D) 1000 units tablet Take 2,000 Units by mouth daily.   Yes [provider]  co-enzyme Q-10 30 MG capsule Take 300 mg by mouth daily.   Yes [provider]  diazepam (VALIUM) 5 MG tablet TAKE 1 TO 2 TABLETS BY MOUTH AT BEDTIME. 03/07/20  Yes Nafziger, Tommi Rumps, NP  diclofenac sodium (VOLTAREN) 1 % GEL as needed.  11/16/16  Yes [provider]  esomeprazole (NEXIUM) 40 MG capsule Take 1 capsule by mouth  daily 01/10/20  Yes Nafziger, Tommi Rumps, NP  famotidine (PEPCID) 40 MG tablet Take 1 tablet (40 mg total) by mouth at bedtime. 01/30/20  Yes Esterwood, Amy S, PA-C  levothyroxine (SYNTHROID) 100 MCG tablet Take 1 tablet by mouth at night. 02/19/20  Yes Nafziger, Tommi Rumps, NP  LUTEIN PO Take by mouth.   Yes [provider]  meloxicam (MOBIC) 7.5 MG tablet Take 1 tablet by mouth twice daily as needed for pain 12/06/19  Yes Nafziger, Tommi Rumps, NP  methocarbamol (ROBAXIN) 500 MG tablet Take 500 mg by mouth as needed for muscle spasms.   Yes [provider]  omega-3 acid ethyl esters (LOVAZA) 1 g capsule Take 1 capsule (1 g total) by mouth 2 (two) times daily. 01/25/20  Yes Nafziger, Tommi Rumps, NP  polyethylene glycol (MIRALAX / GLYCOLAX) packet Take 17 g by mouth daily.   Yes [provider]  saccharomyces boulardii (FLORASTOR) 250 MG capsule Take 250 mg by mouth daily.   Yes [provider]  simvastatin (ZOCOR) 10 MG tablet Take 1 tablet (10 mg total) by mouth at bedtime. 03/28/20  Yes Nafziger, Tommi Rumps, NP  topiramate (TOPAMAX) 25 MG tablet Take 1 tablet (25 mg total) by mouth daily. 03/28/20 02/16/21 Yes Nafziger, Tommi Rumps, NP    traMADol (ULTRAM) 50 MG tablet Take 1 tablet (50 mg total) by mouth every 12 (twelve) hours as needed for moderate pain. 10/05/19  Yes Nafziger, Tommi Rumps, NP  TURMERIC PO Take 1,000 mg by mouth daily.   Yes [provider]  venlafaxine XR (EFFEXOR-XR) 37.5 MG 24 hr capsule Take 1 capsule (37.5 mg total) by mouth daily with breakfast. 03/12/20  Yes Hisada, Elie Goody, MD  pregabalin (LYRICA) 150 MG capsule Take 1 capsule (150 mg total) by mouth 2 (two) times daily. 01/10/20 04/09/20  [name], NP    Allergies    Patient has no known allergies.  Review of Systems   Review of Systems  Constitutional: Negative for fever.  Respiratory: Negative for cough and shortness of breath.   Gastrointestinal: Positive for abdominal pain, anorexia and constipation. Negative for diarrhea, nausea and vomiting.  All other systems reviewed and are negative.   Physical Exam Updated Vital Signs BP (!) 157/72   Pulse 62   Temp 97.8 F (36.6 C) (Oral)   Resp 16   Ht 4\' 11"  (1.499 m)   Wt 77.8 kg   SpO2 97%   BMI 34.66 kg/m   Physical Exam Vitals and nursing note reviewed.  Constitutional:      General: She is not in acute distress.    Appearance: She is well-developed. She is obese.  HENT:     Head: Normocephalic and atraumatic.  Eyes:     Conjunctiva/sclera: Conjunctivae normal.     Pupils: Pupils are equal, round, and reactive to light.  Cardiovascular:     Rate and Rhythm: Normal rate and regular rhythm.     Heart sounds: No murmur.  Pulmonary:     Effort: Pulmonary effort is normal. No respiratory distress.     Breath sounds: Normal breath sounds. No wheezing or rales.  Abdominal:     General: There is no distension.     Palpations: Abdomen is soft.     Tenderness: There is abdominal tenderness in the epigastric area and periumbilical area. There is no guarding or rebound.  Genitourinary:    Rectum: Normal. Guaiac result negative.  Musculoskeletal:        General: No tenderness.  Normal range of motion.     Cervical back: Normal range of motion and neck supple.     Right lower leg: No edema.     Left lower leg: No edema.  Skin:    General: Skin is warm and dry.     Findings: No erythema or rash.  Neurological:     General: No focal deficit present.     Mental Status: She is alert and oriented to person, place, and time. Mental status is at baseline.  Psychiatric:        Mood and Affect: Mood normal.        Behavior: Behavior normal.        Thought Content: Thought content normal.      ED Results / Procedures / Treatments   Labs (all labs ordered are listed, but only abnormal results are displayed) Labs Reviewed  CBC WITH DIFFERENTIAL/PLATELET - Abnormal; Notable for the  following components:      Result Value   RBC 5.35 (*)    Hemoglobin 16.3 (*)    HCT 47.8 (*)    All other components within normal limits  URINALYSIS, ROUTINE W REFLEX MICROSCOPIC - Abnormal; Notable for the following components:   Hgb urine dipstick TRACE (*)    Ketones, ur 15 (*)    All other components within normal limits  URINALYSIS, MICROSCOPIC (REFLEX) - Abnormal; Notable for the following components:   Bacteria, UA RARE (*)    All other components within normal limits  COMPREHENSIVE METABOLIC PANEL  LIPASE, BLOOD  OCCULT BLOOD X 1 CARD TO LAB, STOOL  POC OCCULT BLOOD, ED    EKG None  Radiology CT ABDOMEN PELVIS W CONTRAST  Result Date: 04/09/2020 CLINICAL DATA:  Abdominal pain.  Urinary tract infection. EXAM: CT ABDOMEN AND PELVIS WITH CONTRAST TECHNIQUE: Multidetector CT imaging of the abdomen and pelvis was performed using the standard protocol following bolus administration of intravenous contrast. CONTRAST:  100 mL Omnipaque 300 COMPARISON:  None. FINDINGS: Lower chest: Lung bases are clear. Hepatobiliary: Postcholecystectomy. There is prominence of the common hepatic duct and common bile duct favored to relate to volume increase with post cholecystectomy anatomy. No  significant intrahepatic duct dilatation. Pancreas: Pancreas is normal. No ductal dilatation. No pancreatic inflammation. Spleen: Normal spleen Adrenals/urinary tract: Adrenal glands and kidneys are normal. The ureters and bladder normal. Stomach/Bowel: Stomach, small-bowel and cecum normal. The colon and rectosigmoid colon normal. No diverticulitis. Vascular/Lymphatic: Abdominal aorta is normal caliber with atherosclerotic calcification. There is no retroperitoneal or periportal lymphadenopathy. No pelvic lymphadenopathy. Reproductive: Post hysterectomy.  Adnexa unremarkable Other: No free fluid. Musculoskeletal: Degenerative changes of the spine. No acute findings. IMPRESSION: 1. No explanation for abdominal pain. 2. No evidence of urinary tract infection by CT imaging. 3. Extrahepatic biliary duct dilatation favored benign related to prior cholecystectomy. Electronically Signed   By: Suzy Bouchard M.D.   On: 04/09/2020 20:09    Procedures Procedures (including critical care time)  Medications Ordered in ED Medications  0.9 %  sodium chloride infusion (has no administration in time range)    ED Course  I have reviewed the triage vital signs and the nursing notes.  Pertinent labs & imaging results that were available during my care of the patient were reviewed by me and considered in my medical decision making (see chart for details).    MDM Rules/Calculators/A&P                      Patient is an elderly female presenting with abdominal pain and a dark tarry stool.  The abdominal pain seems to have been waxing and waning for over a month but was definitely worse today.  Patient is no acute distress on exam pain is not seem to be worsened by movement but she did feel it was worse after eating this morning.  Patient has been off Nexium for approximately 2 months and did have a dark stool today in addition to taking meloxicam and NSAIDs on a regular basis.  Concern for peptic ulcer disease and  possible upper GI bleed.  Also concern for possible cholelithiasis given intermittent nature of her discomfort.  Will Hemoccult her stool.  Labs and imaging pending.  Patient states she does not need anything for the pain right now as it is improving.  She has no chest pain or shortness of breath.  Also no significant alcohol use.  She has never had  a history of ulcer disease in the past and reports usually with colonoscopy she has to have polyps removed. Low suspicion for appendicitis.  However possible diverticulitis, pancreatitis.  8:51 PM Hemoccult is negative.  CBC with normal white count and hemoglobin, CMP without acute findings, lipase within normal limits, UA without acute findings.  CT at this time shows no significant findings for abdominal pain.  She does have extrahepatic biliary ductal dilation however this is most likely related to prior cholecystectomy.  Patient remains comfortable and in no acute distress.  We will have her continue Nexium to a modified diet and avoid NSAIDs and follow-up with her gastroenterologist Dr. Fuller Plan.  MDM Number of Diagnoses or Management Options Acute gastritis without hemorrhage, unspecified gastritis type: new, needed workup   Amount and/or Complexity of Data Reviewed Clinical lab tests: ordered and reviewed Tests in the radiology section of CPT: ordered and reviewed Decide to obtain previous medical records or to obtain history from someone other than the patient: yes Obtain history from someone other than the patient: yes Review and summarize past medical records: yes Discuss the patient with other providers: no Independent visualization of images, tracings, or specimens: yes  Risk of Complications, Morbidity, and/or Mortality Presenting problems: moderate Diagnostic procedures: low Management options: low  Patient Progress Patient progress: improved   Final Clinical Impression(s) / ED Diagnoses Final diagnoses:  Acute gastritis without  hemorrhage, unspecified gastritis type    Rx / DC Orders ED Discharge Orders    None       Blanchie Dessert, MD 04/09/20 2055

## 2020-04-09 NOTE — ED Notes (Addendum)
Had a black tarry stool   This afternoon States has found out she has a UTI that they cannot cure she states  Was told   A   Couple of months ago  , states tried  Several meds and could not fi gure out waht cures it,  Also has a pain under rt arm  , hurting and itching  Hx of  Mastectomy on that side  , and perianal pain , . And some constipation on and off

## 2020-04-09 NOTE — Discharge Instructions (Addendum)
Continue on the Nexium at this time and avoid any ibuprofen, Aleve or meloxicam.  Call Dr. Silvio Pate office in the morning for a follow-up visit.  Avoid acidic foods and spicy foods for the time being.  If you start having sudden onset of severe pain, vomiting or other concerns please return to the emergency room.

## 2020-04-09 NOTE — ED Notes (Signed)
Given a warm blanket and dr Maryan Rued did a rectal

## 2020-04-10 ENCOUNTER — Telehealth: Payer: Self-pay | Admitting: Gastroenterology

## 2020-04-10 NOTE — Telephone Encounter (Signed)
Patient has been schedule to see Nicoletta Ba PA 04/21/20 1:30

## 2020-04-10 NOTE — Telephone Encounter (Signed)
Patient husband called states the patient was at ED last night for acute gastritis and they are requested to see Dr. Fuller Plan as soon as possible. Requested to speak with a nurse to see if they can for Dr. Fuller Plan is booked out

## 2020-04-15 ENCOUNTER — Encounter: Payer: Self-pay | Admitting: Adult Health

## 2020-04-15 NOTE — Telephone Encounter (Signed)
Patient's husband reports that his wife is in terrible pain. She has been in the bed all weekend "balled up in a ball".  He is advised that if she is in this much pain she needs to return to the ED for evaluation.  She has an appt next Monday with Amy Esterwood PA.  I explained that I do not have any openings to have her seen this week and that Dr. Fuller Plan is out of the office. He states that he has taken her off all her medications except her probiotic, nexium, and her synthroid.  He is advised that she should be taking her pepcid as well.  He will see if her primary care can see her, but verbalized understanding that she should return to the ED if the pain is incapacitating and she can't get out of the bed.

## 2020-04-15 NOTE — Telephone Encounter (Signed)
Patient's husband is calling seeking advise for states his wife is in a lot of pain

## 2020-04-16 ENCOUNTER — Encounter: Payer: Self-pay | Admitting: Adult Health

## 2020-04-16 ENCOUNTER — Ambulatory Visit (INDEPENDENT_AMBULATORY_CARE_PROVIDER_SITE_OTHER): Payer: Medicare HMO | Admitting: Adult Health

## 2020-04-16 ENCOUNTER — Other Ambulatory Visit: Payer: Self-pay

## 2020-04-16 VITALS — BP 110/80 | HR 82 | Temp 96.0°F | Ht 59.0 in | Wt 164.0 lb

## 2020-04-16 DIAGNOSIS — K2900 Acute gastritis without bleeding: Secondary | ICD-10-CM

## 2020-04-16 NOTE — Progress Notes (Signed)
Subjective:    [name], female    DOB: 08-25-1941, 79 y.o.   MRN: [id number]  HPI 79 year old female who  has a past medical history of Adenomatous colon polyp, Anxiety, Arthritis, Breast cancer ([place]) (08/08/13), Cataract, Chronic insomnia, Cluster headaches, Depression, Diverticulosis, Fatty liver (2011), Fibromyalgia, GERD (gastroesophageal reflux disease), H/O hiatal hernia, History of transfusion (08/30/2013), radiation therapy (10/29/13- 12/14/13), Hypothyroidism, Internal hemorrhoids, Irritable bowel syndrome, Kidney stone, Lymphedema, Macular degeneration, MDD (major depressive disorder), Osteopenia, Other abnormal glucose, Other and unspecified hyperlipidemia, Pain in joint, shoulder region, Pneumonia [id number]), Sleep apnea, and Stress incontinence, female.   She presents to the office today with her husband for follow-up after recent ER visit on 04/09/2020.  She presented with abdominal pain and dark tarry stool.  The abdominal pain had been waxing and waning for over a month but was worse on the day of presentation to the ER after eating breakfast.  At this point she had been off Nexium for approximately 2 months as she was not having any symptoms.  On the morning she presented to the emergency room she did have dark tarry stools.  She was taking Mobic on a regular basis.  In the ER her Hemoccult was negative, CBC was unremarkable, CMP without acute findings, and lipase was within normal limits.  Her UA did not show infection.  CT did not show any significant findings for abdominal pain.  She was continued on Nexium with follow-up with PCP and GI  I received a MyChart message yesterday from her husband informing me that Zoie was still in pretty significant pain, at which time she was advised to start Maalox.  Today she reports that she woke up this morning feeling better and was the first day since she had been discharged that she did not have any pain but continued to  have "discomfort".  She has been taking Pepcid, Nexium, and Mylanta at home.  She has discontinued Mobic and any other anti-inflammatories.  Her husband has been fixing her bland meals.  She does report intermittent episodes of "dark areas in my stool".  She denies fevers, chills, nausea, vomiting but does endorse weakness   Review of Systems See HPI   Past Medical History:  Diagnosis Date  . Adenomatous colon polyp   . Anxiety    PHOBIAS  . Arthritis   . Breast cancer ([place]) 08/08/13   right LOQ  . Cataract   . Chronic insomnia   . Cluster headaches    history of migraines / NONE FOR SEVERAL YRS  . Depression   . Diverticulosis   . Fatty liver 2011  . Fibromyalgia   . GERD (gastroesophageal reflux disease)   . H/O hiatal hernia   . History of transfusion 08/30/2013  . Hx of radiation therapy 10/29/13- 12/14/13   right chest wall 5040 cGy 28 sessions, right supraclavicular/axillary region 5040 cGy 28 sessions, right chest wall boost 1000 cGy 5 sessions  . Hypothyroidism   . Internal hemorrhoids   . Irritable bowel syndrome   . Kidney stone   . Lymphedema    RT ARM - WEARS SLEEVE  . Macular degeneration    hole/right eye  . MDD (major depressive disorder)   . Osteopenia   . Other abnormal glucose   . Other and unspecified hyperlipidemia   . Pain in joint, shoulder region   . Pneumonia [id number]  . Sleep apnea    USES C-PAP  . Stress incontinence, female  Social History   Socioeconomic History  . Marital status: Married    Spouse name: Not on file  . Number of children: 3  . Years of education: Not on file  . Highest education level: Not on file  Occupational History  . Occupation: retired bookkeeper  Tobacco Use  . Smoking status: Former Smoker    Packs/day: 0.10    Years: 2.00    Pack years: 0.20    Types: Cigarettes    Start date: 11/16/1959    Quit date: 11/15/1960    Years since quitting: 59.4  . Smokeless tobacco: Never Used  Substance and Sexual  Activity  . Alcohol use: No    Alcohol/week: 0.0 standard drinks  . Drug use: No  . Sexual activity: Not on file    Comment: menarche age 53, fist live birth 48, P 3, hysterectomy age 89, no HRT, BCP 2 yrs  Other Topics Concern  . Not on file  Social History Narrative   Occupation:  Retired Radiation protection practitioner    Married with 3 grown children      Never Smoked     Alcohol use-no        Social Determinants of Radio broadcast assistant Strain:   . Difficulty of Paying Living Expenses:   Food Insecurity:   . Worried About Charity fundraiser in the Last Year:   . Arboriculturist in the Last Year:   Transportation Needs:   . Film/video editor (Medical):   Marland Kitchen Lack of Transportation (Non-Medical):   Physical Activity:   . Days of Exercise per Week:   . Minutes of Exercise per Session:   Stress:   . Feeling of Stress :   Social Connections:   . Frequency of Communication with Friends and Family:   . Frequency of Social Gatherings with Friends and Family:   . Attends Religious Services:   . Active Member of Clubs or Organizations:   . Attends Archivist Meetings:   Marland Kitchen Marital Status:   Intimate Partner Violence:   . Fear of Current or Ex-Partner:   . Emotionally Abused:   Marland Kitchen Physically Abused:   . Sexually Abused:     Past Surgical History:  Procedure Laterality Date  . ABDOMINAL HYSTERECTOMY    . APPENDECTOMY    . BILATERAL TOTAL MASTECTOMY WITH AXILLARY LYMPH NODE DISSECTION  08/30/2013   Dr Barry Dienes  . BREAST CYST ASPIRATION     9 cysts  . CATARACT EXTRACTION, BILATERAL  2005/2007  . CHOLECYSTECTOMY    . COLONOSCOPY    . CYSTOCELE REPAIR    . EVACUATION BREAST HEMATOMA Left 08/31/2013   Procedure: EVACUATION HEMATOMA BREAST;  Surgeon: Stark Klein, MD;  Location: [facility];  Service: General;  Laterality: Left;  . EYE SURGERY     to repair macular hole  . FOOT ARTHROPLASTY     lt   . GANGLION CYST EXCISION     rt foot  . HEMORRHOID SURGERY     03/1993  .  JOINT REPLACEMENT  03/15/11   left knee replacement  . KNEE ARTHROSCOPY     /partial knee 2016/left knee 2012  . MASS EXCISION  11/04/2011   Procedure: EXCISION MASS;  Surgeon: Cammie Sickle., MD;  Location: [facility];  Service: Orthopedics;  Laterality: Right;  excisional biopsy right ulna mass  . MASTECTOMY W/ SENTINEL NODE BIOPSY Right 08/30/2013   Procedure: RIGHT  AXILLARY SENTINEL LYMPH NODE BIOPSY; Right Axillary  Node Disection;  Surgeon: Stark Klein, MD;  Location: [facility];  Service: General;  Laterality: Right;  Right side nuc med 7:00   . PARTIAL KNEE ARTHROPLASTY Right 11/03/2015   Procedure: RIGHT KNEE MEDIAL UNICOMPARTMENTAL ARTHROPLASTY ;  Surgeon: Gaynelle Arabian, MD;  Location: [facility];  Service: Orthopedics;  Laterality: Right;  . RECTOCELE REPAIR    . SIMPLE MASTECTOMY WITH AXILLARY SENTINEL NODE BIOPSY Left 08/30/2013   Procedure: Bilateral Breast Mastectomy ;  Surgeon: Stark Klein, MD;  Location: [facility];  Service: General;  Laterality: Left;  . skin tags removed     breast, panty line, neckline  . TOE SURGERY     preventative crossover toe surg/right foot  . TOE SURGERY  2009   left foot/screw  in 2nd toe  . TONSILLECTOMY    . UPPER GASTROINTESTINAL ENDOSCOPY      Family History  Problem Relation Age of Onset  . Stroke Mother        died age 43  . Diabetes Mother   . Breast cancer Mother 48  . Breast cancer Sister 53  . Breast cancer Paternal Aunt 10  . Diabetes Maternal Grandfather   . Breast cancer Paternal Grandmother 104  . Breast cancer Paternal Aunt        dx in her 46s  . Cancer Maternal Grandmother        intra-abdominal cancer  . Brain cancer Maternal Uncle 8  . Brain cancer Cousin 26       maternal cousin  . Brain cancer Cousin 20       paternal cousin  . Colon cancer Neg Hx     No Known Allergies  Current Outpatient Medications on File Prior to Visit  Medication Sig Dispense Refill  . butalbital-acetaminophen-caffeine  (FIORICET) 50-325-40 MG tablet Take 1-2 tablets by mouth every 6 (six) hours as needed for headache. 20 tablet 1  . Calcium Carb-Cholecalciferol (CALCIUM-VITAMIN D) 500-400 MG-UNIT TABS Take by mouth 2 (two) times daily.     . cholecalciferol (VITAMIN D) 1000 units tablet Take 2,000 Units by mouth daily.    Marland Kitchen co-enzyme Q-10 30 MG capsule Take 300 mg by mouth daily.    . diazepam (VALIUM) 5 MG tablet TAKE 1 TO 2 TABLETS BY MOUTH AT BEDTIME. 30 tablet 0  . diclofenac sodium (VOLTAREN) 1 % GEL as needed.     Marland Kitchen esomeprazole (NEXIUM) 40 MG capsule Take 1 capsule by mouth  daily 365 capsule 0  . famotidine (PEPCID) 40 MG tablet Take 1 tablet (40 mg total) by mouth at bedtime. 90 tablet 3  . levothyroxine (SYNTHROID) 100 MCG tablet Take 1 tablet by mouth at night. 90 tablet 2  . LUTEIN PO Take by mouth.    . meloxicam (MOBIC) 7.5 MG tablet Take 1 tablet by mouth twice daily as needed for pain 180 tablet 0  . methocarbamol (ROBAXIN) 500 MG tablet Take 500 mg by mouth as needed for muscle spasms.    Marland Kitchen omega-3 acid ethyl esters (LOVAZA) 1 g capsule Take 1 capsule (1 g total) by mouth 2 (two) times daily. 180 capsule 2  . polyethylene glycol (MIRALAX / GLYCOLAX) packet Take 17 g by mouth daily.    Marland Kitchen saccharomyces boulardii (FLORASTOR) 250 MG capsule Take 250 mg by mouth daily.    . simvastatin (ZOCOR) 10 MG tablet Take 1 tablet (10 mg total) by mouth at bedtime. 365 tablet 0  . topiramate (TOPAMAX) 25 MG tablet Take 1 tablet (25 mg total)  by mouth daily. 325 tablet 0  . traMADol (ULTRAM) 50 MG tablet Take 1 tablet (50 mg total) by mouth every 12 (twelve) hours as needed for moderate pain. 30 tablet 2  . TURMERIC PO Take 1,000 mg by mouth daily.    Marland Kitchen venlafaxine XR (EFFEXOR-XR) 37.5 MG 24 hr capsule Take 1 capsule (37.5 mg total) by mouth daily with breakfast. 90 capsule 0  . pregabalin (LYRICA) 150 MG capsule Take 1 capsule (150 mg total) by mouth 2 (two) times daily. 180 capsule 1   No current  facility-administered medications on file prior to visit.    BP 110/80   Pulse 82   Temp (!) 96 F (35.6 C) (Other (Comment))   Ht 4\' 11"  (1.499 m)   Wt 164 lb (74.4 kg)   SpO2 96%   BMI 33.12 kg/m        Objective:   Physical Exam Vitals and nursing note reviewed.  Constitutional:      Appearance: Normal appearance.  Cardiovascular:     Rate and Rhythm: Normal rate and regular rhythm.     Pulses: Normal pulses.     Heart sounds: Normal heart sounds.  Pulmonary:     Effort: Pulmonary effort is normal.     Breath sounds: Normal breath sounds.  Abdominal:     General: Abdomen is flat. Bowel sounds are normal.     Palpations: Abdomen is soft.     Tenderness: There is abdominal tenderness in the right upper quadrant, epigastric area and periumbilical area.  Musculoskeletal:        General: Normal range of motion.  Skin:    General: Skin is warm and dry.  Neurological:     General: No focal deficit present.     Mental Status: She is alert and oriented to person, place, and time.  Psychiatric:        Mood and Affect: Mood normal.        Behavior: Behavior normal.        Thought Content: Thought content normal.        Judgment: Judgment normal.       Assessment & Plan:  1. Acute gastritis, presence of bleeding unspecified, unspecified gastritis type - Reviewed ER notes, labs, and imaging with patient and her husband  -It sounds like she is improving.  She has a appointment with gastroenterology in 6 days.  I will keep her on her current regimen of Nexium, Pepcid, and Mylanta.  She was advised to continue with bland food and drink and stay away from any anti-inflammatory medications.  Advised follow-up and return precautions  [name], NP   Time spent on chart review, time with patient; discussion of gastritis/PUD, home treatment, follow up plan, and documentation 30 minutes

## 2020-04-17 ENCOUNTER — Ambulatory Visit: Payer: Medicare HMO | Admitting: Adult Health

## 2020-04-18 ENCOUNTER — Telehealth: Payer: Self-pay | Admitting: Adult Health

## 2020-04-18 NOTE — Chronic Care Management (AMB) (Signed)
  Chronic Care Management   Note  04/18/2020 Name: Jean Nash MRN: [id number] DOB: [birth year]  Jean Nash is a 79 y.o. year old female who is a primary care patient of [name], NP. I reached out to [name] by phone today in response to a referral sent by [name] PCP, [name], NP.   [name] was given information about Chronic Care Management services today including:  1. CCM service includes personalized support from designated clinical staff supervised by her physician, including individualized plan of care and coordination with other care providers 2. 24/7 contact phone numbers for assistance for urgent and routine care needs. 3. Service will only be billed when office clinical staff spend 20 minutes or more in a month to coordinate care. 4. Only one practitioner may furnish and bill the service in a calendar month. 5. The patient may stop CCM services at any time (effective at the end of the month) by phone call to the office staff.   Patient agreed to services and verbal consent obtained.   Follow up plan:   [place]

## 2020-04-21 ENCOUNTER — Other Ambulatory Visit (INDEPENDENT_AMBULATORY_CARE_PROVIDER_SITE_OTHER): Payer: Medicare HMO

## 2020-04-21 ENCOUNTER — Ambulatory Visit (INDEPENDENT_AMBULATORY_CARE_PROVIDER_SITE_OTHER): Payer: Medicare HMO | Admitting: Physician Assistant

## 2020-04-21 ENCOUNTER — Encounter: Payer: Self-pay | Admitting: Physician Assistant

## 2020-04-21 VITALS — BP 106/71 | HR 72 | Ht 59.0 in | Wt 167.1 lb

## 2020-04-21 DIAGNOSIS — K219 Gastro-esophageal reflux disease without esophagitis: Secondary | ICD-10-CM

## 2020-04-21 DIAGNOSIS — R195 Other fecal abnormalities: Secondary | ICD-10-CM

## 2020-04-21 DIAGNOSIS — R109 Unspecified abdominal pain: Secondary | ICD-10-CM | POA: Diagnosis not present

## 2020-04-21 DIAGNOSIS — R1013 Epigastric pain: Secondary | ICD-10-CM | POA: Diagnosis not present

## 2020-04-21 LAB — CBC WITH DIFFERENTIAL/PLATELET
Basophils Absolute: 0 10*3/uL (ref 0.0–0.1)
Basophils Relative: 0.5 % (ref 0.0–3.0)
Eosinophils Absolute: 0.1 10*3/uL (ref 0.0–0.7)
Eosinophils Relative: 1.6 % (ref 0.0–5.0)
HCT: 46 % (ref 36.0–46.0)
Hemoglobin: 15.7 g/dL — ABNORMAL HIGH (ref 12.0–15.0)
Lymphocytes Relative: 26 % (ref 12.0–46.0)
Lymphs Abs: 2.1 10*3/uL (ref 0.7–4.0)
MCHC: 34.2 g/dL (ref 30.0–36.0)
MCV: 89.4 fl (ref 78.0–100.0)
Monocytes Absolute: 0.6 10*3/uL (ref 0.1–1.0)
Monocytes Relative: 7.9 % (ref 3.0–12.0)
Neutro Abs: 5.2 10*3/uL (ref 1.4–7.7)
Neutrophils Relative %: 64 % (ref 43.0–77.0)
Platelets: 185 10*3/uL (ref 150.0–400.0)
RBC: 5.15 Mil/uL — ABNORMAL HIGH (ref 3.87–5.11)
RDW: 13.7 % (ref 11.5–15.5)
WBC: 8.1 10*3/uL (ref 4.0–10.5)

## 2020-04-21 MED ORDER — SUCRALFATE 1 G PO TABS: 1.0000 g | ORAL_TABLET | Freq: Three times a day (TID) | ORAL | 1 refills | Status: DC

## 2020-04-21 MED ORDER — FAMOTIDINE 40 MG PO TABS: 40.0000 mg | ORAL_TABLET | Freq: Every day | ORAL | 6 refills | Status: DC

## 2020-04-21 MED ORDER — ESOMEPRAZOLE MAGNESIUM 40 MG PO CPDR: DELAYED_RELEASE_CAPSULE | ORAL | 6 refills | Status: DC

## 2020-04-21 NOTE — Patient Instructions (Addendum)
If you are age 77 or older, your body mass index should be between 23-30. Your Body mass index is 33.76 kg/m. If this is out of the aforementioned range listed, please consider follow up with your Primary Care Provider.  If you are age 23 or younger, your body mass index should be between 19-25. Your Body mass index is 33.76 kg/m. If this is out of the aformentioned range listed, please consider follow up with your Primary Care Provider.   Your provider has requested that you go to the basement level for lab work before leaving today. Press "B" on the elevator. The lab is located at the first door on the left as you exit the elevator.  Due to recent changes in healthcare laws, you may see the results of your imaging and laboratory studies on MyChart before your provider has had a chance to review them.  We understand that in some cases there may be results that are confusing or concerning to you. Not all laboratory results come back in the same time frame and the provider may be waiting for multiple results in order to interpret others.  Please give [name] 48 hours in order for your provider to thoroughly review all the results before contacting the office for clarification of your results.   You have been scheduled for an endoscopy. Please follow written instructions given to you at your visit today. If you use inhalers (even only as needed), please bring them with you on the day of your procedure.  CONTINUE Nexium 40 mg 1 capsule every morning. Famotidine 40 mg 1 tablet at bedtime. Refills have been sent to your pharmacy.  START Carafate 1 gm, 1 tablet with meals and at bedtime. This has been sent to your pharmacy.  You may use Tylenol, but do not use any NSAID.  Follow up pending your Endoscopy

## 2020-04-21 NOTE — Progress Notes (Signed)
Reviewed and agree with management plan.  Jovonte Commins T. Shyah Cadmus, MD FACG [facility]  

## 2020-04-21 NOTE — Progress Notes (Signed)
Subjective:    [name], female    DOB: Apr 15, 1941, 79 y.o.   MRN: [id number]  HPI Jean Nash is a 79 year old white female, established with Dr. Fuller Plan.  She has history of colon polyps and last underwent colonoscopy in 2018 with removal of one 6 mm adenomatous polyp, was also noted to have multiple diverticuli and internal hemorrhoids. She was seen in March 2021 by myself at that point with several month history of cough which was felt possibly related to GERD.  She had been on Nexium at that time without any improvement in cough but with good control of GERD symptoms.  She underwent a barium swallow which did not show evidence of a hiatal hernia, there was evidence of reflux to the midesophagus,, however episodes of coughing during the study did not always coincide with any visible reflux.  She also has history of pulmonary fibrosis, felt to be radiation related, hearing loss, fibromyalgia and IBS. She comes in today after recent ER visit on 04/09/2020 with complaints of severe abdominal pain. Labs were done and reviewed and unremarkable CBC and chemistries including lipase.  She was documented to be Hemoccult negative. She had CT of the abdomen pelvis done with contrast showing her to be status post cholecystectomy with mildly prominent common hepatic duct and common bile duct felt consistent with postcholecystectomy changes, otherwise negative study. She had been off of Nexium over the previous few months and had been on meloxicam regularly for arthritic symptoms.  Meloxicam was stopped and she started back on Nexium about a week ago 40 mg daily.  She has also been taking Mylanta fairly regularly.  She says her pain has improved but has not resolved but is not "terrible" like it was.  She is not having any nausea or vomiting, sometimes feels better with food on her stomach.  No fever or chills.  She continues to see specks of dark or black material in her stool concerning her for blood.   She describes the abdominal pain as pressure like and located in the mid abdomen.  Bowel movements have been fairly normal for her. Review of Systems Pertinent positive and negative review of systems were noted in the above HPI section.  All other review of systems was otherwise negative.  Outpatient Encounter Medications as of 04/21/2020  Medication Sig   diazepam (VALIUM) 5 MG tablet Take 5 mg by mouth every 6 (six) hours as needed for anxiety. As needed   esomeprazole (NEXIUM) 40 MG capsule Take 1 capsule by mouth  daily   famotidine (PEPCID) 40 MG tablet Take 1 tablet (40 mg total) by mouth at bedtime.   topiramate (TOPAMAX) 25 MG tablet Take 1 tablet (25 mg total) by mouth daily.   venlafaxine XR (EFFEXOR-XR) 37.5 MG 24 hr capsule Take 1 capsule (37.5 mg total) by mouth daily with breakfast.   [DISCONTINUED] esomeprazole (NEXIUM) 40 MG capsule Take 1 capsule by mouth  daily   [DISCONTINUED] famotidine (PEPCID) 40 MG tablet Take 1 tablet (40 mg total) by mouth at bedtime.   pregabalin (LYRICA) 150 MG capsule Take 1 capsule (150 mg total) by mouth 2 (two) times daily.   simvastatin (ZOCOR) 10 MG tablet Take 1 tablet (10 mg total) by mouth at bedtime.   sucralfate (CARAFATE) 1 g tablet Take 1 tablet (1 g total) by mouth 4 (four) times daily -  with meals and at bedtime.   [DISCONTINUED] butalbital-acetaminophen-caffeine (FIORICET) 50-325-40 MG tablet Take 1-2 tablets by mouth every  6 (six) hours as needed for headache.   [DISCONTINUED] Calcium Carb-Cholecalciferol (CALCIUM-VITAMIN D) 500-400 MG-UNIT TABS Take by mouth 2 (two) times daily.    [DISCONTINUED] cholecalciferol (VITAMIN D) 1000 units tablet Take 2,000 Units by mouth daily.   [DISCONTINUED] co-enzyme Q-10 30 MG capsule Take 300 mg by mouth daily.   [DISCONTINUED] diazepam (VALIUM) 5 MG tablet TAKE 1 TO 2 TABLETS BY MOUTH AT BEDTIME.   [DISCONTINUED] diclofenac sodium (VOLTAREN) 1 % GEL as needed.    [DISCONTINUED]  levothyroxine (SYNTHROID) 100 MCG tablet Take 1 tablet by mouth at night.   [DISCONTINUED] LUTEIN PO Take by mouth.   [DISCONTINUED] meloxicam (MOBIC) 7.5 MG tablet Take 1 tablet by mouth twice daily as needed for pain   [DISCONTINUED] methocarbamol (ROBAXIN) 500 MG tablet Take 500 mg by mouth as needed for muscle spasms.   [DISCONTINUED] omega-3 acid ethyl esters (LOVAZA) 1 g capsule Take 1 capsule (1 g total) by mouth 2 (two) times daily.   [DISCONTINUED] polyethylene glycol (MIRALAX / GLYCOLAX) packet Take 17 g by mouth daily.   [DISCONTINUED] saccharomyces boulardii (FLORASTOR) 250 MG capsule Take 250 mg by mouth daily.   [DISCONTINUED] traMADol (ULTRAM) 50 MG tablet Take 1 tablet (50 mg total) by mouth every 12 (twelve) hours as needed for moderate pain.   [DISCONTINUED] TURMERIC PO Take 1,000 mg by mouth daily.   No facility-administered encounter medications on file as of 04/21/2020.   No Known Allergies Patient Active Problem List   Diagnosis Date Noted   MDD (major depressive disorder), recurrent, in full remission ([place]) 12/10/2019   Pulmonary fibrosis ([place]) 12/04/2018   MDD (major depressive disorder), recurrent episode, mild ([place]) 12/22/2017   Osteoporosis 07/14/2017   OA (osteoarthritis) of knee 11/03/2015   Osteopenia 08/08/2015   Headache 02/03/2015   Atypical chest pain 07/15/2014   Arthralgia 02/22/2014   Psoriasis 02/22/2014   Malignant neoplasm of lower-outer quadrant of right breast of female, estrogen receptor positive ([place]) 08/09/2013   Neck pain 06/22/2013   Left knee pain 11/29/2012   Reactive depression (situational) 04/26/2012   Right wrist pain 02/02/2012   Right foot pain 10/04/2011   Nevus 06/05/2011   Status post total knee replacement 04/06/2011   Overactive bladder 03/14/2011   KNEE PAIN, LEFT 08/31/2010   HEARING LOSS 08/17/2010   HIRSUTISM 06/16/2009   CYST, IDIOPATHIC 05/20/2008   IRRITABLE BOWEL SYNDROME 04/15/2008    Allergic asthma, mild intermittent, uncomplicated 75/64/3329   GERD 03/19/2008   COLONIC POLYPS, HX OF 03/19/2008   NEPHROLITHIASIS, HX OF 03/19/2008   Hypothyroidism 03/18/2008   Hyperlipidemia 03/18/2008   INSOMNIA, CHRONIC 03/18/2008   FIBROMYALGIA 03/18/2008   Prediabetes 03/18/2008   Social History   Socioeconomic History   Marital status: Married    Spouse name: Not on file   Number of children: 3   Years of education: Not on file   Highest education level: Not on file  Occupational History   Occupation: retired bookkeeper  Tobacco Use   Smoking status: Former Smoker    Packs/day: 0.10    Years: 2.00    Pack years: 0.20    Types: Cigarettes    Start date: 11/16/1959    Quit date: 11/15/1960    Years since quitting: 59.4   Smokeless tobacco: Never Used  Substance and Sexual Activity   Alcohol use: No    Alcohol/week: 0.0 standard drinks   Drug use: No   Sexual activity: Not on file    Comment: menarche age 10, fist live  birth 39, P 3, hysterectomy age 66, no HRT, BCP 2 yrs  Other Topics Concern   Not on file  Social History Narrative   Occupation:  Retired Radiation protection practitioner    Married with 3 grown children      Never Smoked     Alcohol use-no        Social Determinants of Radio broadcast assistant Strain:    Difficulty of Paying Living Expenses:   Food Insecurity:    Worried About Charity fundraiser in the Last Year:    Arboriculturist in the Last Year:   Transportation Needs:    Film/video editor (Medical):    Lack of Transportation (Non-Medical):   Physical Activity:    Days of Exercise per Week:    Minutes of Exercise per Session:   Stress:    Feeling of Stress :   Social Connections:    Frequency of Communication with Friends and Family:    Frequency of Social Gatherings with Friends and Family:    Attends Religious Services:    Active Member of Clubs or Organizations:    Attends Music therapist:      Marital Status:   Intimate Partner Violence:    Fear of Current or Ex-Partner:    Emotionally Abused:    Physically Abused:    Sexually Abused:     Ms. Lata family history includes Brain cancer (age of onset: 34) in her cousin; Brain cancer (age of onset: 65) in her cousin; Brain cancer (age of onset: 26) in her maternal uncle; Breast cancer in her paternal aunt; Breast cancer (age of onset: 86) in her paternal aunt and paternal grandmother; Breast cancer (age of onset: 87) in her sister; Breast cancer (age of onset: 34) in her mother; Cancer in her maternal grandmother; Diabetes in her maternal grandfather and mother; Stroke in her mother.      Objective:    Vitals:   04/21/20 1319  BP: 106/71  Pulse: 72    Physical Exam Well-developed well-nourished eld WF  in no acute distress.  Height, Weight,167 BMI 33.7 accompanied by her husband  HEENT; nontraumatic normocephalic, EOMI, PER R LA, sclera anicteric. Oropharynx; not examined Neck; supple, no JVD Cardiovascular; regular rate and rhythm with S1-S2, no murmur rub or gallop Pulmonary; Clear bilaterally Abdomen; soft, mildly tender in the mid abdomen and hypogastrium nondistended, no palpable mass or hepatosplenomegaly, bowel sounds are active Rectal; not repeated today recently documented Hemoccult negative per ER 04/09/20 Skin; benign exam, no jaundice rash or appreciable lesions Extremities; no clubbing cyanosis or edema skin warm and dry Neuro/Psych; alert and oriented x4, grossly nonfocal mood and affect appropriate       Assessment & Plan:   #56 79 year old white female with 2-week history of abdominal pain, constant, pressure-like and located in the mid abdomen.  Recent labs unremarkable and CT of the abdomen and pelvis on 04/09/2020 without any acute findings. She does have minimally prominent common hepatic duct and common bile duct felt consistent with postcholecystectomy state Etiology of pain is not  entirely clear though suspect NSAID induced gastropathy, peptic ulcer disease or enteropathy.  #2  History of adenomatous colon polyps-last colonoscopy 2018 3.  Diverticulosis 4.  Hemorrhoids 5.  Chronic GERD 6.  Pulmonary fibrosis/felt likely radiation related #6 osteoarthritis 7.  Fibromyalgia 8.  Hearing loss 9.  IBS  Plan; continue Nexium 40 mg p.o. every morning Add Pepcid 40 mg p.o. AC dinner/evening meal Add Carafate  1 g between meals and at bedtime x1 month Stay off all aspirin and NSAIDs, may use Tylenol Patient will be scheduled for EGD with Dr. Fuller Plan.  Procedure was discussed in detail with the patient and her husband including indications risks and benefits and they are agreeable to proceed. She has completed COVID-19 vaccination. She has been on a very bland diet and we discussed gradually reintroducing more regular diet.  Juanice Warburton Genia Harold PA-C 04/21/2020   Cc: [name], NP

## 2020-04-30 ENCOUNTER — Encounter: Payer: Self-pay | Admitting: Certified Registered Nurse Anesthetist

## 2020-05-01 ENCOUNTER — Other Ambulatory Visit: Payer: Self-pay

## 2020-05-01 ENCOUNTER — Encounter: Payer: Self-pay | Admitting: Gastroenterology

## 2020-05-01 ENCOUNTER — Ambulatory Visit (AMBULATORY_SURGERY_CENTER): Payer: Medicare HMO | Admitting: Gastroenterology

## 2020-05-01 VITALS — BP 137/73 | HR 53 | Temp 97.8°F | Resp 12 | Ht 59.0 in | Wt 167.0 lb

## 2020-05-01 DIAGNOSIS — K449 Diaphragmatic hernia without obstruction or gangrene: Secondary | ICD-10-CM

## 2020-05-01 DIAGNOSIS — K259 Gastric ulcer, unspecified as acute or chronic, without hemorrhage or perforation: Secondary | ICD-10-CM | POA: Diagnosis not present

## 2020-05-01 DIAGNOSIS — R1033 Periumbilical pain: Secondary | ICD-10-CM

## 2020-05-01 DIAGNOSIS — K317 Polyp of stomach and duodenum: Secondary | ICD-10-CM

## 2020-05-01 DIAGNOSIS — K2970 Gastritis, unspecified, without bleeding: Secondary | ICD-10-CM

## 2020-05-01 DIAGNOSIS — K319 Disease of stomach and duodenum, unspecified: Secondary | ICD-10-CM | POA: Diagnosis not present

## 2020-05-01 DIAGNOSIS — K3189 Other diseases of stomach and duodenum: Secondary | ICD-10-CM

## 2020-05-01 MED ORDER — SODIUM CHLORIDE 0.9 % IV SOLN: 500.0000 mL | INTRAVENOUS | Status: DC

## 2020-05-01 NOTE — Patient Instructions (Signed)
Continue present medications. Discontinue Sucralfate.  Avoid aspirin, ibuprofen, naproxen or other non-steroidal anti inflammatory drugs.     YOU HAD AN ENDOSCOPIC PROCEDURE TODAY AT [facility]:   Refer to the procedure report that was given to you for any specific questions about what was found during the examination.  If the procedure report does not answer your questions, please call your gastroenterologist to clarify.  If you requested that your care partner not be given the details of your procedure findings, then the procedure report has been included in a sealed envelope for you to review at your convenience later.  YOU SHOULD EXPECT: Some feelings of bloating in the abdomen. Passage of more gas than usual.  Walking can help get rid of the air that was put into your GI tract during the procedure and reduce the bloating. If you had a lower endoscopy (such as a colonoscopy or flexible sigmoidoscopy) you may notice spotting of blood in your stool or on the toilet paper. If you underwent a bowel prep for your procedure, you may not have a normal bowel movement for a few days.  Please Note:  You might notice some irritation and congestion in your nose or some drainage.  This is from the oxygen used during your procedure.  There is no need for concern and it should clear up in a day or so.  SYMPTOMS TO REPORT IMMEDIATELY:    Following upper endoscopy (EGD)  Vomiting of blood or coffee ground material  New chest pain or pain under the shoulder blades  Painful or persistently difficult swallowing  New shortness of breath  Fever of 100F or higher  Black, tarry-looking stools  For urgent or emergent issues, a gastroenterologist can be reached at any hour by calling [phone]. Do not use MyChart messaging for urgent concerns.    DIET:  We do recommend a small meal at first, but then you may proceed to your regular diet.  Drink plenty of fluids but you should avoid  alcoholic beverages for 24 hours.  ACTIVITY:  You should plan to take it easy for the rest of today and you should NOT DRIVE or use heavy machinery until tomorrow (because of the sedation medicines used during the test).    FOLLOW UP: Our staff will call the number listed on your records 48-72 hours following your procedure to check on you and address any questions or concerns that you may have regarding the information given to you following your procedure. If we do not reach you, we will leave a message.  We will attempt to reach you two times.  During this call, we will ask if you have developed any symptoms of COVID 19. If you develop any symptoms (ie: fever, flu-like symptoms, shortness of breath, cough etc.) before then, please call [phone].  If you test positive for Covid 19 in the 2 weeks post procedure, please call and report this information to [name].    If any biopsies were taken you will be contacted by phone or by letter within the next 1-3 weeks.  Please call us at [phone] if you have not heard about the biopsies in 3 weeks.    SIGNATURES/CONFIDENTIALITY: You and/or your care partner have signed paperwork which will be entered into your electronic medical record.  These signatures attest to the fact that that the information above on your After Visit Summary has been reviewed and is understood.  Full responsibility of the confidentiality of this discharge  information lies with you and/or your care-partner. 

## 2020-05-01 NOTE — Progress Notes (Signed)
Called to room to assist during endoscopic procedure.  Patient ID and intended procedure confirmed with present staff. Received instructions for my participation in the procedure from the performing physician.  

## 2020-05-01 NOTE — Progress Notes (Signed)
Vs cw  

## 2020-05-01 NOTE — Progress Notes (Signed)
Report given to PACU, vss 

## 2020-05-01 NOTE — Op Note (Signed)
[place] Patient Name: Jean Nash Procedure Date: 05/01/2020 4:12 PM MRN: [id number] Endoscopist: Ladene Artist , MD Age: 79 Referring MD:  Date of Birth: [birth year] Gender: Female Account #: [id number] Procedure:                Upper GI endoscopy Indications:              Periumbilical abdominal pain Medicines:                Monitored Anesthesia Care Procedure:                Pre-Anesthesia Assessment:                           - Prior to the procedure, a History and Physical                            was performed, and patient medications and                            allergies were reviewed. The patient's tolerance of                            previous anesthesia was also reviewed. The risks                            and benefits of the procedure and the sedation                            options and risks were discussed with the patient.                            All questions were answered, and informed consent                            was obtained. Prior Anticoagulants: The patient has                            taken no previous anticoagulant or antiplatelet                            agents. ASA Grade Assessment: II - A patient with                            mild systemic disease. After reviewing the risks                            and benefits, the patient was deemed in                            satisfactory condition to undergo the procedure.                           After obtaining informed consent, the endoscope was  passed under direct vision. Throughout the                            procedure, the patient's blood pressure, pulse, and                            oxygen saturations were monitored continuously. The                            Endoscope was introduced through the mouth, and                            advanced to the second part of duodenum. The upper                            GI endoscopy was  accomplished without difficulty.                            The patient tolerated the procedure well. Scope In: Scope Out: Findings:                 The examined esophagus was normal.                           One non-bleeding cratered gastric ulcer (healing)                            with no stigmata of bleeding was found in the                            prepyloric region of the stomach. The lesion was 5                            mm in largest dimension.                           Patchy mild inflammation characterized by                            congestion (edema), erythema and granularity was                            found in the gastric body and in the gastric                            antrum. Biopsies were taken with a cold forceps for                            histology.                           Multiple 4 to 9 mm sessile polyps with no bleeding                            and no  stigmata of recent bleeding were found in                            the gastric fundus and in the gastric body.                            Biopsies were taken with a cold forceps for                            histology of several of the larger polyps.                           A small hiatal hernia was present.                           The exam of the stomach was otherwise normal.                           The duodenal bulb and second portion of the                            duodenum were normal. Complications:            No immediate complications. Estimated Blood Loss:     Estimated blood loss was minimal. Impression:               - Normal esophagus.                           - Non-bleeding gastric ulcer with no stigmata of                            bleeding.                           - Gastritis. Biopsied.                           - Multiple gastric polyps. Biopsied.                           - Small hiatal hernia.                           - Normal duodenal bulb and second portion of  the                            duodenum. Recommendation:           - Patient has a contact number available for                            emergencies. The signs and symptoms of potential                            delayed complications were discussed with the  patient. Return to normal activities tomorrow.                            Written discharge instructions were provided to the                            patient.                           - Resume previous diet.                           - Antireflux measures long term.                           - Continue present medications including Nexium 40                            mg po qd, famotidine 40 mg po hs long term.                           - Discontinue sucralfate.                           - Await pathology results.                           - Avoid aspirin, ibuprofen, naproxen, or other                            non-steroidal anti-inflammatory drugs. Ladene Artist, MD 05/01/2020 4:58:20 PM This report has been signed electronically.

## 2020-05-05 ENCOUNTER — Telehealth: Payer: Self-pay

## 2020-05-05 NOTE — Telephone Encounter (Signed)
No answer, left message to call back later today, B.Sharifah Champine RN. 

## 2020-05-05 NOTE — Telephone Encounter (Signed)
2nd follow up call made.  NALM 

## 2020-05-13 ENCOUNTER — Encounter: Payer: Self-pay | Admitting: Gastroenterology

## 2020-05-15 ENCOUNTER — Ambulatory Visit: Payer: Medicare HMO

## 2020-05-15 ENCOUNTER — Other Ambulatory Visit: Payer: Self-pay | Admitting: Adult Health

## 2020-05-15 ENCOUNTER — Other Ambulatory Visit: Payer: Self-pay

## 2020-05-15 DIAGNOSIS — E039 Hypothyroidism, unspecified: Secondary | ICD-10-CM

## 2020-05-15 DIAGNOSIS — R7303 Prediabetes: Secondary | ICD-10-CM

## 2020-05-15 DIAGNOSIS — E785 Hyperlipidemia, unspecified: Secondary | ICD-10-CM

## 2020-05-15 DIAGNOSIS — G4700 Insomnia, unspecified: Secondary | ICD-10-CM

## 2020-05-15 DIAGNOSIS — M797 Fibromyalgia: Secondary | ICD-10-CM

## 2020-05-15 DIAGNOSIS — Z8669 Personal history of other diseases of the nervous system and sense organs: Secondary | ICD-10-CM

## 2020-05-15 DIAGNOSIS — M8580 Other specified disorders of bone density and structure, unspecified site: Secondary | ICD-10-CM

## 2020-05-15 DIAGNOSIS — F329 Major depressive disorder, single episode, unspecified: Secondary | ICD-10-CM

## 2020-05-15 DIAGNOSIS — K2900 Acute gastritis without bleeding: Secondary | ICD-10-CM

## 2020-05-15 NOTE — Chronic Care Management (AMB) (Signed)
Chronic Care Management Pharmacy  Name: Jean Nash  MRN: [id number] DOB: [birth year]  Initial Questions: 1. Have you seen any other providers since your last visit? NA 2. Any changes in your medicines or health? Yes - patient reports recently stopping several medications   Chief Complaint/ HPI  Jean Nash,  79 y.o. , female presents for their Initial CCM visit with the clinical pharmacist In office.  Patient and spouse were both present in visit. Patient requested to review medications and focus if she can restart back on medications she was previously and would like not to restart medications if not necessary. Patient reported only currently taking: - esomeprazole 68m - famotidine 4563m - diclofenac gel - melatonin - levothyroxine 100 mcg - venlafaxine XR 37.63m50m She also had questions on restarting meloxicam.    PCP : NafDorothyann PengP  Their chronic conditions include: GERD, Hypothyroidism, HLD, Depression, Insomnia, Fibromyalgia, Migraine headaches, Prediabetes, Osteopenia  Office Visits: 04/16/2020- CorDorothyann PengA- Patient presented for office visit for follow up after recent ER visit on 04/09/20 for abdominal pain. Patient to see GI in 6 days. Patient to continue on nexium, Pepcid, and Mylanta.   03/07/2020- CorDorothyann PengP- Patient presented for office visit for low back pain. Toradol and Depo-Medrol injection given in office today. Patient to take valium 63mg41mer weekend. If no improvement, MRI to be ordered.   Consult Visit: 05/01/2020- Gastroenterology- MalcLucio Edward- Patient presented for procedure visit. Patient to resume diet. Antireflux measures long term. Continue Nexium 40mg3mly and famotidine 40mg 82mlong term. Discontinue sucralfate. Patient to avoid NSAIDs.   04/21/2020- Gastroenterology- Amy EsNicoletta BaPaKekoskeeent presented for office visit for complaints of 2 week history of abdominal pain. Patient to continue Nexium 40mg e29m morning and  to add famotidine 40mg AC11mner/ evening meal and sulcralfate 1g between meals and at bedtime for 1 month. Patient to obtain EGD with Dr. Stark.  Fuller Plan6/20201- Behavioral health- Reina HiNorman Claytient presented for virtual visit. No major interventions/ changes.   02/26/2020- Pulmonology- Clinton Baird Lyonstient presented for office visit for follow up. Suspicion post-XRT fibrosis. Plan- HRCT ILD protocol. Patient instructed to use benzonatate and OTC Delsym as needed. Recommended Jolly RaMarolyn Hammock diabetic candies.   Medications: Outpatient Encounter Medications as of 05/15/2020  Medication Sig Note  . butalbital-acetaminophen-caffeine (FIORICET) 50-325-40 MG tablet Take 1 tablet by mouth as needed for headache.   . Calcium Carbonate-Vitamin D (CALCIUM-VITAMIN D3 PO) Take 1 tablet by mouth in the morning and at bedtime. 05/16/2020: 200 units/ 40mcg ta34m  . Cholecalciferol (VITAMIN D3) 50 MCG (2000 UT) TABS Take 1 tablet by mouth daily.    . diclofenac Sodium (VOLTAREN) 1 % GEL Apply topically as needed.   . esomeprMarland Kitchenzole (NEXIUM) 40 MG capsule Take 1 capsule by mouth  daily (Patient taking differently: Take 40 mg by mouth daily before breakfast. Take 1 capsule by mouth  daily)   . famotidine (PEPCID) 40 MG tablet Take 1 tablet (40 mg total) by mouth at bedtime.   . levothyMarland Kitchenoxine (SYNTHROID) 100 MCG tablet Take 100 mcg by mouth daily.   . Lutein-Zeaxanthin 25-5 MG CAPS Take 1 capsule by mouth daily.   . MELATONMarland KitchenN PO Take 1 tablet by mouth daily.   . simvastatin (ZOCOR) 10 MG tablet Take 1 tablet (10 mg total) by mouth at bedtime.   . traMADol (ULTRAM) 50 MG tablet Take 50 mg by mouth every 6 (six) hours as needed.   . venlafaxine  XR (EFFEXOR-XR) 37.5 MG 24 hr capsule Take 1 capsule (37.5 mg total) by mouth daily with breakfast.   . diazepam (VALIUM) 5 MG tablet Take 5 mg by mouth every 6 (six) hours as needed for anxiety. As needed (Patient not taking: Reported on 05/15/2020)   . pregabalin  (LYRICA) 150 MG capsule Take 1 capsule (150 mg total) by mouth 2 (two) times daily. (Patient not taking: Reported on 05/16/2020)   . sucralfate (CARAFATE) 1 g tablet Take 1 tablet (1 g total) by mouth 4 (four) times daily -  with meals and at bedtime. (Patient not taking: Reported on 05/15/2020)   . topiramate (TOPAMAX) 25 MG tablet Take 1 tablet (25 mg total) by mouth daily. (Patient not taking: Reported on 05/15/2020)    No facility-administered encounter medications on file as of 05/15/2020.     Current Diagnosis/Assessment:  Goals Addressed            This Visit's Progress   . Pharmacy Care Plan       CARE PLAN ENTRY (see longitudinal plan of care for additional care plan information)  Current Barriers:  . Chronic Disease Management support, education, and care coordination needs related to Hyperlipidemia, Hypothyroidism, Depression, and Prediabetes, Insomnia, Fibromyalgia, Migraine headaches, Osteopenia, Gastritis   Gastritis . Pharmacist Clinical Goal(s) o Over the next 180 days, patient will work with PharmD and providers to minaintain/ minimize abdominal pain.  . Current regimen:  . Esomeprazole 75m, 1 capsule once daily (take in the morning 30 minutes before breakfast)  . Famotidine 453m 1 tablet at bedtime  . Patient self care activities o Patient will continue taking current medications as instructed at the specified time.   Hyperlipidemia Lab Results  Component Value Date/Time   LDLCALC 128 (H) 11/15/2019 09:14 AM   LDLDIRECT 113.0 05/17/2016 07:49 AM   . Pharmacist Clinical Goal(s): o Over the next 180 days, patient will work with PharmD and providers to achieve LDL goal < 100 . Current regimen:   Simvastatin 1054m1 tablet at bedtime   Lovaza 1 gram, 1 capsule twice daily  . Interventions: o Recommend to restart taking simvastatin and Lovaza as indicated.  . Patient self care activities - Over the next 180 days, patient will: o Continue current medications as  instructed.   Prediabetes Lab Results  Component Value Date/Time   HGBA1C 5.4 10/05/2019 03:49 PM   HGBA1C 6.0 07/14/2017 09:56 AM   HGBA1C 5.8 06/14/2017 12:57 PM   HGBA1C 6.3 08/16/2016 08:50 AM   . Pharmacist Clinical Goal(s): o Over the next 180 days, patient will work with PharmD and providers to maintain A1c goal <6.5% . Current regimen:  o Lifestyle interventions (diet and exercise)  . Patient self care activities - Over the next 180 days, patient will: o Check blood sugar as directed, document, and provide at future appointments  Hypothyroidism . Pharmacist Clinical Goal(s) o Over the next 180 days, patient will work with PharmD and providers to maintain TSH: 0.35 to 4.5 uIU/mL . Current regimen:  o Levothyroxine 100m56m1 tablet once daily  . Interventions: o We discussed:  administration time of levothyroxine (at least 30 minutes before first meal or 3 to 4 hours after last meal).   . Patient self care activities - Over the next 180 days, patient will: o Continue current medications as instructed.  Depression . Pharmacist Clinical Goal(s) o Over the next 180 days, patient will work with PharmD and providers to maintain/ improve mood/ depression symptoms . Current  regimen:  o venlafaxine XR 37.65m, 1 capsule daily with breakfast  . Interventions: o Patient to follow up with behavioral health on possibility of tapering off venlafaxine. Venlafaxine should not be abruptly discontinued.  . Patient self care activities o Patient will continue current medication as instructed and follow up with behavioral health.   Imsomnia . Pharmacist Clinical Goal(s) o Over the next 180 days, patient will work with PharmD and providers to improve sleep.  . Current regimen:  o Melatonin, 1 tablet every night  . Patient self care activities o Patient will verify dose taking and confirm at next follow up.   Fibromyalgia . Pharmacist Clinical Goal(s) o Over the next 180 days, patient  will work with PharmD and providers to minimize symptoms associated with fibromyalgia  . Current regimen:  o Pregabalin 2541m 1 capsule twice daily (currently on hold).  . Interventions: o Recommend continue as is and restart with onset of symptoms.  . Patient self care activities o Patient will continue as instructed.   Migraine headaches  . Pharmacist Clinical Goal(s) o Over the next 180 days, patient will work with PharmD and providers to  . Current regimen:  o Butalbital- APAP- caffeine 50-325-4023m1 tablet as needed for headache  o Topiramate 56m66m tablet once daily (currently on hold).  . Interventions: o Recommend continue as is and restart with onset of symptoms.  . Patient self care activities o Patient will ontinue as instructed.  Osteopenia  . Pharmacist Clinical Goal(s) o Over the next 180 days, patient will work with PharmD and providers to prevent bone fractures.  . Current regimen:  . Calcium/ vitmain D 200units/ 40mc69m tablet twice daily  . Vitamin D3 2000 units, 1 tablet once daily  . Interventions: o Recommend [phone] units of vitamin D daily. Recommend 1200 mg of calcium daily from dietary and supplemental sources. . Patient self care activities o Patient will restart supplements.   Osteoarthritis . Pharmacist Clinical Goal(s) o Over the next 180 days, patient will work with PharmD and providers to minimize pain.  . Current regimen:  . Diclofenac 1% gel, apply topically as needed . Tramadol  50mg,53mablet every six hours as needed  . Interventions: o We discussed: based on notes from Dr. Stark Fuller Planory pTommi Rumpsnt is to avoid NSAIDs including meloxicam, ibuprofen, naproxen.  o Recommend trial of over-the-counter acetaminophen.  . Patient self care activities o Patient will continue current medications as instructed.   Medication management . Pharmacist Clinical Goal(s): o Over the next 180 days, patient will work with PharmD and providers to maintain  optimal medication adherence . [facility] o Comprehensive medication review performed. o Continue current medication management strategy . Patient self care activities - Over the next 180 days, patient will: o Take medications as prescribed o Report any questions or concerns to PharmD and/or provider(s)  Initial goal documentation       SDOH Interventions     Most Recent Value  SDOH Interventions  Financial Strain Interventions Intervention Not Indicated  Transportation Interventions Intervention Not Indicated       Gastritis   Patient reports slight pain, but it  has improved from when she presented to ED.   Patient was on these meds in past: Sucralfate 1g (patient reports being told by Dr. Stark Fuller Planop medication)   Patient is currently controlled on the following medications:  . Esomeprazole 40mg, 30mpsule once daily (takes in the morning 30 minutes  before breakfast)  . Famotidine 20m, 1 tablet at bedtime   We discussed: administration times of esomeprazole and famotidine   Plan Continue current medications  Managed by GI (Dr. SFuller Plan.    Hypothyroidism  Patient reported taking levothyroxine overnight. She reports waking up around 2 to 3 times a night.   Lab Results  Component Value Date/Time   TSH 1.46 11/15/2019 09:14 AM   TSH 0.84 07/20/2018 11:03 AM   FREET4 1.14 12/13/2013 10:55 AM   Patient is currently controlled on the following medications:  . Levothyroxine 1052m, 1 tablet once daily   We discussed:  administration time of levothyroxine (at least 30 minutes before first meal or 3 to 4 hours after last meal).    Plan Continue current medications  Hyperlipidemia  Patient reports she weighed over 200 pounds and has lost a lot of weight. Currently weighs 164 lbs and has a goal of 150 lbs. Reports has made changes in diet. Focuses on eating fruits and vegetables and currently has started swimming again.   LDL  goal < 100  Lipid Panel     Component Value Date/Time   CHOL 202 (H) 11/15/2019 0914   TRIG 109.0 11/15/2019 0914   HDL 51.90 11/15/2019 0914   LDLCALC 128 (H) 11/15/2019 0914   LDLDIRECT 113.0 05/17/2016 0749    Hepatic Function Latest Ref Rng & Units 04/09/2020 11/15/2019 07/20/2018  Total Protein 6.5 - 8.1 g/dL 6.9 6.2 6.3  Albumin 3.5 - 5.0 g/dL 4.2 4.3 4.2  AST 15 - 41 U/L _0 ALT 0 - 44 U/L 23 20 34  Alk Phosphatase 38 - 126 U/L 56 67 45  Total Bilirubin 0.3 - 1.2 mg/dL 0.7 0.5 0.5  Bilirubin, Direct 0.0 - 0.3 mg/dL - - 0.1     The 10-year ASCVD risk score (GMikey BussingC Jr., et al., 2013) is: 22.1%   Values used to calculate the score:     Age: [age]ears     Sex: Female     Is Non-Hispanic African American: No     Diabetic: No     Tobacco smoker: No     Systolic Blood Pressure: 13681mHg     Is BP treated: No     HDL Cholesterol: 51.9 mg/dL     Total Cholesterol: 202 mg/dL   Patient has failed these meds in past: Lovaza   Patient is currently uncontrolled on the following medications:  Currently no medications    On hold per patient- Simvastatin 1033m1 tablet at bedtime   On hold per patient- Lovaza 1 gram, 1 capsule twice daily   We discussed:  diet and exercise extensively  Plan Recommend patient  to restart simvastatin and Lovaza  Continue control with diet and exercise  Depression  Patient reports she hopes to come off of venlafaxine since she reports taking for many years and states she feels her mood is doing well. She has plans to  discuss at upcoming visit with behavioral health.   Patient has failed these meds in past: citalopram   Patient is currently controlled on the following medications:  . venlafaxine XR 37.5mg17m capsule daily with breakfast   Plan Managed by behavioral healh.  Continue current medications  Insomnia  Patient reports improvement in sleep. Notes having routine waking up at night since she prefers to take levothyroxine  then.   Patient is currently controlled on the following medications:  . MeMarland Kitchenatonin, 1 tablet every night   Plan Continue  current medications  Fibromyalgia    Patient reports being off pregabalin over a month and notes no fibromyalgia symptoms in a long time. And feels much better in terms of being able to do ADL.  Patient is currently controlled on the following medications:  . Currently no medications   . On hold- pregabalin 269m, 1 capsule twice daily  We discussed:  Side effects of pregabalin (CNS depression)   Plan Recommend continue as is and restart with onset of symptoms.    Migraine headaches   Patient reports being off topiramate in over a month and notes no headaches similar to what she experienced in the past.  Patient is currently controlled on the following medications:  . Butalbital- APAP- caffeine 50-325-471m 1 tablet as needed for headache   . On hold per patient- topiramate 2552m1 tablet once daily   Plan Recommend continue as is and restart topiramate with onset of symptoms.  Prediabetes   Recent Relevant Labs: Lab Results  Component Value Date/Time   HGBA1C 5.4 10/05/2019 03:49 PM   HGBA1C 6.0 07/14/2017 09:56 AM   HGBA1C 5.8 06/14/2017 12:57 PM   HGBA1C 6.3 08/16/2016 08:50 AM   EGFR >60 09/01/2017 11:27 AM   EGFR 81 (L) 08/30/2016 10:56 AM   MICROALBUR <0.7 05/17/2016 07:49 AM   MICROALBUR 0.50 07/01/2011 10:25 AM     Checking BG: occasionally   Recent FBG Readings:unable to provide recent readings   Patient has failed these meds in past: metformin  Patient is currently controlled on the following medications:   Lifestyle interventions    We discussed: diet and exercise extensively (see above)   Plan Continue control with diet and exercise   Osteopenia   Last DEXA Scan: 08/12/2017  T-Score femoral neck: RFN: -1.7; LFN: -2.0  T-Score lumbar spine: -1.8  VITD  Date Value Ref Range Status  07/14/2017 56.40 30.00 - 100.00  ng/mL Final     Patient is not a candidate for pharmacologic treatment  Patient is currently controlled on the following medications:  . No supplements currently.   . On hold per patient- Calcium/ vitmain D 200units/ 75m7m1 tablet twice daily  . On hold per patient- Vitamin D3 2000 units, 1 tablet once daily   We discussed:  Recommend [phone] units of vitamin D daily. Recommend 1200 mg of calcium daily from dietary and supplemental sources.  Plan Recommend restarting calcium and vitamin D supplements.   Osteoarthritis   Patient notes still having some arthritis pain on wrist and inquired about restarting meloxicam.   Patient is currently uncontrolled on the following medications:  . Diclofenac 1% gel, apply topically as needed . Tramadol  50mg80mtablet every six hours as needed   We discussed: based on notes from Dr. StarkFuller PlanCory Tommi Rumpsent is to avoid NSAIDs including meloxicam.    Plan Recommend trying acetaminophen OTC as needed (max: 3000mg/39m).   OTC/ supplements    Patient inquired on restarting turmeric.   Plan Recommended against restart of turmeric due to non-bleeding gastric ulcer (healing) found at recent endoscopy (05/01/2020)    Medication Management  Patient organizes medications: patient reports having own strategy. She fills medications in pill box every 2 months.  [facility] (patient refilling medications with coupon for cheaper)  - simvastatin 10mg (19m filled 12/06/19 for 90DS)  - pregabalin 75mg (l80mfilled 12/06/19 for 90DS) - topiramate 25mg (la54milled 12/16/19 for 90DS)  - venlafaxine ER 37.5mg (last67mlled 12/17/19 for  90DS)    Follow-up Follow up visit with PharmD in 6 months.    Anson Crofts, PharmD Clinical Pharmacist [facility] [phone]

## 2020-05-16 DIAGNOSIS — M797 Fibromyalgia: Secondary | ICD-10-CM | POA: Insufficient documentation

## 2020-05-16 NOTE — Patient Instructions (Addendum)
Visit Information  Goals Addressed            This Visit's Progress   . Pharmacy Care Plan       CARE PLAN ENTRY (see longitudinal plan of care for additional care plan information)  Current Barriers:  . Chronic Disease Management support, education, and care coordination needs related to Hyperlipidemia, Hypothyroidism, Depression, and Prediabetes, Insomnia, Fibromyalgia, Migraine headaches, Osteopenia, Gastritis   Gastritis . Pharmacist Clinical Goal(s) o Over the next 180 days, patient will work with PharmD and providers to minaintain/ minimize abdominal pain.  . Current regimen:  . Esomeprazole 40mg , 1 capsule once daily (take in the morning 30 minutes before breakfast)  . Famotidine 40mg , 1 tablet at bedtime  . Patient self care activities o Patient will continue taking current medications as instructed at the specified time.   Hyperlipidemia Lab Results  Component Value Date/Time   LDLCALC 128 (H) 11/15/2019 09:14 AM   LDLDIRECT 113.0 05/17/2016 07:49 AM   . Pharmacist Clinical Goal(s): o Over the next 180 days, patient will work with PharmD and providers to achieve LDL goal < 100 . Current regimen:   Simvastatin 10mg , 1 tablet at bedtime   Lovaza 1 gram, 1 capsule twice daily  . Interventions: o Recommend to restart taking simvastatin and Lovaza as indicated.  . Patient self care activities - Over the next 180 days, patient will: o Continue current medications as instructed.   Prediabetes Lab Results  Component Value Date/Time   HGBA1C 5.4 10/05/2019 03:49 PM   HGBA1C 6.0 07/14/2017 09:56 AM   HGBA1C 5.8 06/14/2017 12:57 PM   HGBA1C 6.3 08/16/2016 08:50 AM   . Pharmacist Clinical Goal(s): o Over the next 180 days, patient will work with PharmD and providers to maintain A1c goal <6.5% . Current regimen:  o Lifestyle interventions (diet and exercise)  . Patient self care activities - Over the next 180 days, patient will: o Check blood sugar as directed,  document, and provide at future appointments  Hypothyroidism . Pharmacist Clinical Goal(s) o Over the next 180 days, patient will work with PharmD and providers to maintain TSH: 0.35 to 4.5 uIU/mL . Current regimen:  o Levothyroxine 111mcg, 1 tablet once daily  . Interventions: o We discussed:  administration time of levothyroxine (at least 30 minutes before first meal or 3 to 4 hours after last meal).   . Patient self care activities - Over the next 180 days, patient will: o Continue current medications as instructed.  Depression . Pharmacist Clinical Goal(s) o Over the next 180 days, patient will work with PharmD and providers to maintain/ improve mood/ depression symptoms . Current regimen:  o venlafaxine XR 37.5mg , 1 capsule daily with breakfast  . Interventions: o Patient to follow up with behavioral health on possibility of tapering off venlafaxine. Venlafaxine should not be abruptly discontinued.  . Patient self care activities o Patient will continue current medication as instructed and follow up with behavioral health.   Imsomnia . Pharmacist Clinical Goal(s) o Over the next 180 days, patient will work with PharmD and providers to improve sleep.  . Current regimen:  o Melatonin, 1 tablet every night  . Patient self care activities o Patient will verify dose taking and confirm at next follow up.   Fibromyalgia . Pharmacist Clinical Goal(s) o Over the next 180 days, patient will work with PharmD and providers to minimize symptoms associated with fibromyalgia  . Current regimen:  o Pregabalin 250mg , 1 capsule twice daily (currently on  hold).  . Interventions: o Recommend continue as is and restart with onset of symptoms.  . Patient self care activities o Patient will continue as instructed.   Migraine headaches  . Pharmacist Clinical Goal(s) o Over the next 180 days, patient will work with PharmD and providers to  . Current regimen:  o Butalbital- APAP- caffeine  50-325-40mg , 1 tablet as needed for headache  o Topiramate 25mg , 1 tablet once daily (currently on hold).  . Interventions: o Recommend continue as is and restart with onset of symptoms.  . Patient self care activities o Patient will ontinue as instructed.  Osteopenia  . Pharmacist Clinical Goal(s) o Over the next 180 days, patient will work with PharmD and providers to prevent bone fractures.  . Current regimen:  . Calcium/ vitmain D 200units/ 63mcg- 1 tablet twice daily  . Vitamin D3 2000 units, 1 tablet once daily  . Interventions: o Recommend [phone] units of vitamin D daily. Recommend 1200 mg of calcium daily from dietary and supplemental sources. . Patient self care activities o Patient will restart supplements.   Osteoarthritis . Pharmacist Clinical Goal(s) o Over the next 180 days, patient will work with PharmD and providers to minimize pain.  . Current regimen:  . Diclofenac 1% gel, apply topically as needed . Tramadol  50mg , 1 tablet every six hours as needed  . Interventions: o We discussed: based on notes from Dr. Fuller Plan and Tommi Rumps patient is to avoid NSAIDs including meloxicam, ibuprofen, naproxen.  o Recommend trial of over-the-counter acetaminophen.  . Patient self care activities o Patient will continue current medications as instructed.   Medication management . Pharmacist Clinical Goal(s): o Over the next 180 days, patient will work with PharmD and providers to maintain optimal medication adherence . [facility]  . Interventions o Comprehensive medication review performed. o Continue current medication management strategy . Patient self care activities - Over the next 180 days, patient will: o Take medications as prescribed o Report any questions or concerns to PharmD and/or provider(s)  Initial goal documentation        Ms. Bartel was given information about Chronic Care Management services today including:  1. CCM service includes  personalized support from designated clinical staff supervised by her physician, including individualized plan of care and coordination with other care providers 2. 24/7 contact phone numbers for assistance for urgent and routine care needs. 3. Standard insurance, coinsurance, copays and deductibles apply for chronic care management only during months in which we provide at least 20 minutes of these services. Most insurances cover these services at 100%, however patients may be responsible for any copay, coinsurance and/or deductible if applicable. This service may help you avoid the need for more expensive face-to-face services. 4. Only one practitioner may furnish and bill the service in a calendar month. 5. The patient may stop CCM services at any time (effective at the end of the month) by phone call to the office staff.  Patient agreed to services and verbal consent obtained.   The patient verbalized understanding of instructions provided today and agreed to receive a mailed copy of patient instruction and/or educational materials. The pharmacy team will reach out to the patient again over the next 180 days.   Anson Crofts, PharmD Clinical Pharmacist [facility] [phone]    High Cholesterol  High cholesterol is a condition in which the blood has high levels of a white, waxy, fat-like substance (cholesterol). The human body needs  small amounts of cholesterol. The liver makes all the cholesterol that the body needs. Extra (excess) cholesterol comes from the food that we eat. Cholesterol is carried from the liver by the blood through the blood vessels. If you have high cholesterol, deposits (plaques) may build up on the walls of your blood vessels (arteries). Plaques make the arteries narrower and stiffer. Cholesterol plaques increase your risk for heart attack and stroke. Work with your health care provider to keep your cholesterol levels in a healthy  range. What increases the risk? This condition is more likely to develop in people who:  Eat foods that are high in animal fat (saturated fat) or cholesterol.  Are overweight.  Are not getting enough exercise.  Have a family history of high cholesterol. What are the signs or symptoms? There are no symptoms of this condition. How is this diagnosed? This condition may be diagnosed from the results of a blood test.  If you are older than age 61, your health care provider may check your cholesterol every 4-6 years.  You may be checked more often if you already have high cholesterol or other risk factors for heart disease. The blood test for cholesterol measures:  "Bad" cholesterol (LDL cholesterol). This is the main type of cholesterol that causes heart disease. The desired level for LDL is less than 100.  "Good" cholesterol (HDL cholesterol). This type helps to protect against heart disease by cleaning the arteries and carrying the LDL away. The desired level for HDL is 60 or higher.  Triglycerides. These are fats that the body can store or burn for energy. The desired number for triglycerides is lower than 150.  Total cholesterol. This is a measure of the total amount of cholesterol in your blood, including LDL cholesterol, HDL cholesterol, and triglycerides. A healthy number is less than 200. How is this treated? This condition is treated with diet changes, lifestyle changes, and medicines. Diet changes  This may include eating more whole grains, fruits, vegetables, nuts, and fish.  This may also include cutting back on red meat and foods that have a lot of added sugar. Lifestyle changes  Changes may include getting at least 40 minutes of aerobic exercise 3 times a week. Aerobic exercises include walking, biking, and swimming. Aerobic exercise along with a healthy diet can help you maintain a healthy weight.  Changes may also include quitting smoking. Medicines  Medicines are  usually given if diet and lifestyle changes have failed to reduce your cholesterol to healthy levels.  Your health care provider may prescribe a statin medicine. Statin medicines have been shown to reduce cholesterol, which can reduce the risk of heart disease. Follow these instructions at home: Eating and drinking If told by your health care provider:  Eat chicken (without skin), fish, veal, shellfish, ground [place] breast, and round or loin cuts of red meat.  Do not eat fried foods or fatty meats, such as hot dogs and salami.  Eat plenty of fruits, such as apples.  Eat plenty of vegetables, such as broccoli, potatoes, and carrots.  Eat beans, peas, and lentils.  Eat grains such as barley, rice, couscous, and bulgur wheat.  Eat pasta without cream sauces.  Use skim or nonfat milk, and eat low-fat or nonfat yogurt and cheeses.  Do not eat or drink whole milk, cream, ice cream, egg yolks, or hard cheeses.  Do not eat stick margarine or tub margarines that contain trans fats (also called partially hydrogenated oils).  Do not eat  saturated tropical oils, such as coconut oil and palm oil.  Do not eat cakes, cookies, crackers, or other baked goods that contain trans fats.  General instructions  Exercise as directed by your health care provider. Increase your activity level with activities such as gardening, walking, and taking the stairs.  Take over-the-counter and prescription medicines only as told by your health care provider.  Do not use any products that contain nicotine or tobacco, such as cigarettes and e-cigarettes. If you need help quitting, ask your health care provider.  Keep all follow-up visits as told by your health care provider. This is important. Contact a health care provider if:  You are struggling to maintain a healthy diet or weight.  You need help to start on an exercise program.  You need help to stop smoking. Get help right away if:  You have chest  pain.  You have trouble breathing. This information is not intended to replace advice given to you by your health care provider. Make sure you discuss any questions you have with your health care provider. Document Revised: 11/04/2017 Document Reviewed: 05/01/2016 Elsevier Patient Education  [employer].

## 2020-05-20 ENCOUNTER — Other Ambulatory Visit: Payer: Self-pay

## 2020-05-20 ENCOUNTER — Other Ambulatory Visit (INDEPENDENT_AMBULATORY_CARE_PROVIDER_SITE_OTHER): Payer: Medicare HMO

## 2020-05-20 ENCOUNTER — Encounter: Payer: Self-pay | Admitting: Adult Health

## 2020-05-20 DIAGNOSIS — E039 Hypothyroidism, unspecified: Secondary | ICD-10-CM

## 2020-05-20 LAB — TSH: TSH: 1.45 u[IU]/mL (ref 0.35–4.50)

## 2020-05-20 MED ORDER — LEVOTHYROXINE SODIUM 100 MCG PO TABS: 100.0000 ug | ORAL_TABLET | Freq: Every day | ORAL | 0 refills | Status: DC

## 2020-05-21 ENCOUNTER — Other Ambulatory Visit: Payer: Medicare HMO

## 2020-05-21 NOTE — Progress Notes (Deleted)
BH MD/PA/NP OP Progress Note  05/21/2020 2:55 PM Jean Nash  MRN:  [id number]  Chief Complaint:  HPI: *** Visit Diagnosis: No diagnosis found.  Past Psychiatric History: Please see initial evaluation for full details. I have reviewed the history. No updates at this time.     Past Medical History:  Past Medical History:  Diagnosis Date  . Adenomatous colon polyp   . Anxiety    PHOBIAS  . Arthritis   . Breast cancer ([place]) 08/08/13   right LOQ  . Cataract   . Chronic insomnia   . Cluster headaches    history of migraines / NONE FOR SEVERAL YRS  . Depression   . Diverticulosis   . Fatty liver 2011  . Fibromyalgia   . GERD (gastroesophageal reflux disease)   . H/O hiatal hernia   . History of transfusion 08/30/2013  . Hx of radiation therapy 10/29/13- 12/14/13   right chest wall 5040 cGy 28 sessions, right supraclavicular/axillary region 5040 cGy 28 sessions, right chest wall boost 1000 cGy 5 sessions  . Hypothyroidism   . Internal hemorrhoids   . Irritable bowel syndrome   . Kidney stone   . Lymphedema    RT ARM - WEARS SLEEVE  . Macular degeneration    hole/right eye  . MDD (major depressive disorder)   . Osteopenia   . Other abnormal glucose   . Other and unspecified hyperlipidemia   . Pain in joint, shoulder region   . Pneumonia [id number],[id number]  . Sleep apnea    USES C-PAP  . Stress incontinence, female     Past Surgical History:  Procedure Laterality Date  . ABDOMINAL HYSTERECTOMY    . APPENDECTOMY    . BILATERAL TOTAL MASTECTOMY WITH AXILLARY LYMPH NODE DISSECTION  08/30/2013   Dr Barry Dienes  . BREAST CYST ASPIRATION     9 cysts  . CATARACT EXTRACTION, BILATERAL  2005/2007  . CHOLECYSTECTOMY    . COLONOSCOPY    . CYSTOCELE REPAIR    . EVACUATION BREAST HEMATOMA Left 08/31/2013   Procedure: EVACUATION HEMATOMA BREAST;  Surgeon: Stark Klein, MD;  Location: [facility];  Service: General;  Laterality: Left;  . EYE SURGERY     to repair macular hole  . FOOT  ARTHROPLASTY     lt   . GANGLION CYST EXCISION     rt foot  . HEMORRHOID SURGERY     03/1993  . JOINT REPLACEMENT  03/15/11   left knee replacement  . KNEE ARTHROSCOPY     /partial knee 2016/left knee 2012  . MASS EXCISION  11/04/2011   Procedure: EXCISION MASS;  Surgeon: Cammie Sickle., MD;  Location: [place];  Service: Orthopedics;  Laterality: Right;  excisional biopsy right ulna mass  . MASTECTOMY W/ SENTINEL NODE BIOPSY Right 08/30/2013   Procedure: RIGHT  AXILLARY SENTINEL LYMPH NODE BIOPSY; Right Axillary Node Disection;  Surgeon: Stark Klein, MD;  Location: [place];  Service: General;  Laterality: Right;  Right side nuc med 7:00   . PARTIAL KNEE ARTHROPLASTY Right 11/03/2015   Procedure: RIGHT KNEE MEDIAL UNICOMPARTMENTAL ARTHROPLASTY ;  Surgeon: Gaynelle Arabian, MD;  Location: [facility];  Service: Orthopedics;  Laterality: Right;  . RECTOCELE REPAIR    . SIMPLE MASTECTOMY WITH AXILLARY SENTINEL NODE BIOPSY Left 08/30/2013   Procedure: Bilateral Breast Mastectomy ;  Surgeon: Stark Klein, MD;  Location: [facility];  Service: General;  Laterality: Left;  . skin tags removed     breast, panty line,  neckline  . TOE SURGERY     preventative crossover toe surg/right foot  . TOE SURGERY  2009   left foot/screw  in 2nd toe  . TONSILLECTOMY    . UPPER GASTROINTESTINAL ENDOSCOPY      Family Psychiatric History: Please see initial evaluation for full details. I have reviewed the history. No updates at this time.     Family History:  Family History  Problem Relation Age of Onset  . Stroke Mother        died age 105  . Diabetes Mother   . Breast cancer Mother 19  . Breast cancer Sister 37  . Breast cancer Paternal Aunt 78  . Diabetes Maternal Grandfather   . Breast cancer Paternal Grandmother 52  . Breast cancer Paternal Aunt        dx in her 57s  . Cancer Maternal Grandmother        intra-abdominal cancer  . Brain cancer Maternal Uncle 8  . Brain cancer Cousin  81       maternal cousin  . Brain cancer Cousin 20       paternal cousin  . Colon cancer Neg Hx     Social History:  Social History   Socioeconomic History  . Marital status: Married    Spouse name: Not on file  . Number of children: 3  . Years of education: Not on file  . Highest education level: Not on file  Occupational History  . Occupation: retired bookkeeper  Tobacco Use  . Smoking status: Former Smoker    Packs/day: 0.10    Years: 2.00    Pack years: 0.20    Types: Cigarettes    Start date: 11/16/1959    Quit date: 11/15/1960    Years since quitting: 59.5  . Smokeless tobacco: Never Used  Vaping Use  . Vaping Use: Never used  Substance and Sexual Activity  . Alcohol use: No    Alcohol/week: 0.0 standard drinks  . Drug use: No  . Sexual activity: Not on file    Comment: menarche age 22, fist live birth 68, P 3, hysterectomy age 85, no HRT, BCP 2 yrs  Other Topics Concern  . Not on file  Social History Narrative   Occupation:  Retired Radiation protection practitioner    Married with 3 grown children      Never Smoked     Alcohol use-no        Social Determinants of Radio broadcast assistant Strain: Low Risk   . Difficulty of Paying Living Expenses: Not hard at all  Food Insecurity:   . Worried About Charity fundraiser in the Last Year:   . Arboriculturist in the Last Year:   Transportation Needs: No Transportation Needs  . Lack of Transportation (Medical): No  . Lack of Transportation (Non-Medical): No  Physical Activity:   . Days of Exercise per Week:   . Minutes of Exercise per Session:   Stress:   . Feeling of Stress :   Social Connections:   . Frequency of Communication with Friends and Family:   . Frequency of Social Gatherings with Friends and Family:   . Attends Religious Services:   . Active Member of Clubs or Organizations:   . Attends Archivist Meetings:   Marland Kitchen Marital Status:     Allergies: No Known Allergies  Metabolic Disorder Labs: Lab  Results  Component Value Date   HGBA1C 5.4 10/05/2019   MPG 126 (  H) 08/27/2013   MPG 117 (H) 06/22/2013   Lab Results  Component Value Date   PROLACTIN 3.9 07/22/2009   Lab Results  Component Value Date   CHOL 202 (H) 11/15/2019   TRIG 109.0 11/15/2019   HDL 51.90 11/15/2019   CHOLHDL 4 11/15/2019   VLDL 21.8 11/15/2019   LDLCALC 128 (H) 11/15/2019   LDLCALC 96 07/20/2018   Lab Results  Component Value Date   TSH 1.45 05/20/2020   TSH 1.46 11/15/2019    Therapeutic Level Labs: No results found for: LITHIUM No results found for: VALPROATE No components found for:  CBMZ  Current Medications: Current Outpatient Medications  Medication Sig Dispense Refill  . butalbital-acetaminophen-caffeine (FIORICET) 50-325-40 MG tablet Take 1 tablet by mouth as needed for headache.    . Calcium Carbonate-Vitamin D (CALCIUM-VITAMIN D3 PO) Take 1 tablet by mouth in the morning and at bedtime.    . Cholecalciferol (VITAMIN D3) 50 MCG (2000 UT) TABS Take 1 tablet by mouth daily.     . diazepam (VALIUM) 5 MG tablet Take 5 mg by mouth every 6 (six) hours as needed for anxiety. As needed (Patient not taking: Reported on 05/15/2020)    . diclofenac Sodium (VOLTAREN) 1 % GEL Apply topically as needed.    Marland Kitchen esomeprazole (NEXIUM) 40 MG capsule Take 1 capsule by mouth  daily (Patient taking differently: Take 40 mg by mouth daily before breakfast. Take 1 capsule by mouth  daily) 30 capsule 6  . famotidine (PEPCID) 40 MG tablet Take 1 tablet (40 mg total) by mouth at bedtime. 30 tablet 6  . levothyroxine (SYNTHROID) 100 MCG tablet Take 1 tablet (100 mcg total) by mouth daily. 365 tablet 0  . Lutein-Zeaxanthin 25-5 MG CAPS Take 1 capsule by mouth daily.    Marland Kitchen MELATONIN PO Take 1 tablet by mouth daily.    . pregabalin (LYRICA) 150 MG capsule Take 1 capsule (150 mg total) by mouth 2 (two) times daily. (Patient not taking: Reported on 05/16/2020) 180 capsule 1  . simvastatin (ZOCOR) 10 MG tablet Take 1 tablet  (10 mg total) by mouth at bedtime. 365 tablet 0  . sucralfate (CARAFATE) 1 g tablet Take 1 tablet (1 g total) by mouth 4 (four) times daily -  with meals and at bedtime. (Patient not taking: Reported on 05/15/2020) 120 tablet 1  . topiramate (TOPAMAX) 25 MG tablet Take 1 tablet (25 mg total) by mouth daily. (Patient not taking: Reported on 05/15/2020) 325 tablet 0  . traMADol (ULTRAM) 50 MG tablet Take 50 mg by mouth every 6 (six) hours as needed.    . venlafaxine XR (EFFEXOR-XR) 37.5 MG 24 hr capsule Take 1 capsule (37.5 mg total) by mouth daily with breakfast. 90 capsule 0   No current facility-administered medications for this visit.     Musculoskeletal: Strength & Muscle Tone: N/A Gait & Station: N/A Patient leans: N/A  Psychiatric Specialty Exam: Review of Systems  There were no vitals taken for this visit.There is no height or weight on file to calculate BMI.  General Appearance: {Appearance:22683}  Eye Contact:  {B[facility] EYE CONTACT:22684}  Speech:  Clear and Coherent  Volume:  Normal  Mood:  {BHH MOOD:22306}  Affect:  {Affect (PAA):22687}  Thought Process:  Coherent  Orientation:  Full (Time, Place, and Person)  Thought Content: Logical   Suicidal Thoughts:  {ST/HT (PAA):22692}  Homicidal Thoughts:  {ST/HT (PAA):22692}  Memory:  Immediate;   Good  Judgement:  {Judgement (PAA):22694}  Insight:  {  Insight (PAA):22695}  Psychomotor Activity:  Normal  Concentration:  Concentration: Good and Attention Span: Good  Recall:  Good  Fund of Knowledge: Good  Language: Good  Akathisia:  No  Handed:  Right  AIMS (if indicated): not done  Assets:  Communication Skills Desire for Improvement  ADL's:  Intact  Cognition: WNL  Sleep:  {B[facility] GOOD/FAIR/POOR:22877}   Screenings: PHQ2-9     Office Visit from 07/20/2018 in [place] at [facility] from 07/14/2017 in [place] at [facility] from 11/28/2015 in [place] at [facility]  from 10/14/2014 in [place] at [facility] from 10/01/2013 in [place] at [facility] Total Score 0 0 0 1 0  PHQ-9 Total Score -- 5 -- -- --       Assessment and Plan:  Jean Nash is a 79 y.o. year old female with a history of depression, anxiety,breast cancer s/p mastectomy, who presents for follow up appointment for below.     # MDD, recurrent in full, remission She denies significant mood symptoms since the last visit except the grief of loss of her friend of over 52 years. Will continue current dose of venlafaxine as maintenance therapy for depression . Will consider tapering it off at the next appointment if she denies any mood symptoms.  Noted that she did have worsening in irritability when she tried to taper off venlafaxine, although it is difficult to discern whether it was more attributable to discontinuation symptoms. Of note, she is prescribed Valium by her PCP for insomnia; discussed risk of fall and oversedation/dependence.    Plan  1.Continue venlafaxine 37.5 mg daily 2. Next appointment: 7/12 at 2:30 PM for 20 mins ,[email or web address] - diazepam 5 mg prescribed by her PCP, on pregabalin,   The patient demonstrates the following risk factors for suicide: Chronic risk factors for suicide include:psychiatric disorder ofdepression. Acute risk factorsfor suicide include: N/A. Protective factorsfor this patient include: positive social support, coping skills and hope for the future. Considering these factors, the overall suicide risk at this point appears to below. Patientisappropriate for outpatient follow up.  Norman Clay, MD 05/21/2020, 2:56 PM

## 2020-05-26 ENCOUNTER — Encounter: Payer: Self-pay | Admitting: Adult Health

## 2020-05-26 ENCOUNTER — Telehealth (HOSPITAL_COMMUNITY): Payer: Medicare HMO | Admitting: Psychiatry

## 2020-05-28 ENCOUNTER — Encounter: Payer: Self-pay | Admitting: Adult Health

## 2020-06-09 ENCOUNTER — Telehealth: Payer: Self-pay | Admitting: Gastroenterology

## 2020-06-09 ENCOUNTER — Telehealth (HOSPITAL_COMMUNITY): Payer: Self-pay | Admitting: *Deleted

## 2020-06-09 ENCOUNTER — Telehealth (HOSPITAL_COMMUNITY): Payer: Self-pay | Admitting: Psychiatry

## 2020-06-09 DIAGNOSIS — F3342 Major depressive disorder, recurrent, in full remission: Secondary | ICD-10-CM

## 2020-06-09 MED ORDER — VENLAFAXINE HCL ER 37.5 MG PO CP24: 37.5000 mg | ORAL_CAPSULE | Freq: Every day | ORAL | 0 refills | Status: DC

## 2020-06-09 NOTE — Telephone Encounter (Signed)
Ordered refill of venlafaxine per request. Please contact the patient to make follow up appointment for 20 mins.

## 2020-06-09 NOTE — Telephone Encounter (Signed)
Patient reports that she has left sided abdominal pain that "feels like it will explode when I cough".  Patient c/o constipation.  She reports constant pain on the left lower side, but pain is "excriuciating when I cough, laugh, sneeze, or have to have a BM".  She reports pain is  below the umbilicus.  She has not been taking the ultram, she was not sure if it was an NSAID.  She is asking for help with the pain.  She is tolerating a regular diet.  She is asking if the pain is from the ulcer or "is there something else going on"  Discussed with Dr. Fuller Plan.  Possibly musculoskeletal pain.  Try ultram and call back later in the week if this fails to improve.  Patient notified

## 2020-06-09 NOTE — Telephone Encounter (Signed)
Called number on and patient husband picked up. When staff verified where she was calling from, patient husband then stated that patient said she will no longer be coming to see or talk with Dr. Modesta Messing and to take her office that list. When staff asked why, patient husband stated that provider cancelled appt on them and did not bother to call them to let them know and they discussed this with their PCP and they told them that they don't have to come back. Per pt husband, patient don't need the help and it not taking her medication and office will be reported to [facility]. Per pt husband patient is not coming back.

## 2020-06-11 ENCOUNTER — Ambulatory Visit: Payer: Medicare HMO

## 2020-06-19 ENCOUNTER — Encounter: Payer: Self-pay | Admitting: Adult Health

## 2020-06-19 ENCOUNTER — Ambulatory Visit (INDEPENDENT_AMBULATORY_CARE_PROVIDER_SITE_OTHER): Payer: Medicare HMO | Admitting: Adult Health

## 2020-06-19 ENCOUNTER — Other Ambulatory Visit: Payer: Self-pay

## 2020-06-19 VITALS — BP 130/78 | Temp 98.4°F | Wt 163.0 lb

## 2020-06-19 DIAGNOSIS — M25521 Pain in right elbow: Secondary | ICD-10-CM | POA: Diagnosis not present

## 2020-06-19 DIAGNOSIS — Z760 Encounter for issue of repeat prescription: Secondary | ICD-10-CM

## 2020-06-19 MED ORDER — OMEGA-3-ACID ETHYL ESTERS 1 G PO CAPS: 1.0000 g | ORAL_CAPSULE | Freq: Two times a day (BID) | ORAL | 3 refills | Status: DC

## 2020-06-19 NOTE — Progress Notes (Signed)
Subjective:    [name], female    DOB: 16-Sep-1941, 79 y.o.   MRN: [id number]  HPI 79 year old female who is being evaluated today for acute right elbow pain that has been present for 2 weeks.  Today the pain has resolved.  She reports loss of grip strength and pain with movement of the right elbow.  No radiating pain, no numbness, no tingling, no redness, no edema.  She also needs a prescription for Lovaza   Review of Systems See HPI   Past Medical History:  Diagnosis Date   Adenomatous colon polyp    Anxiety    PHOBIAS   Arthritis    Breast cancer ([place]) 08/08/13   right LOQ   Cataract    Chronic insomnia    Cluster headaches    history of migraines / NONE FOR SEVERAL YRS   Depression    Diverticulosis    Fatty liver 2011   Fibromyalgia    GERD (gastroesophageal reflux disease)    H/O hiatal hernia    History of transfusion 08/30/2013   Hx of radiation therapy 10/29/13- 12/14/13   right chest wall 5040 cGy 28 sessions, right supraclavicular/axillary region 5040 cGy 28 sessions, right chest wall boost 1000 cGy 5 sessions   Hypothyroidism    Internal hemorrhoids    Irritable bowel syndrome    Kidney stone    Lymphedema    RT ARM - WEARS SLEEVE   Macular degeneration    hole/right eye   MDD (major depressive disorder)    Osteopenia    Other abnormal glucose    Other and unspecified hyperlipidemia    Pain in joint, shoulder region    Pneumonia 0349,1791   Sleep apnea    USES C-PAP   Stress incontinence, female     Social History   Socioeconomic History   Marital status: Married    Spouse name: Not on file   Number of children: 3   Years of education: Not on file   Highest education level: Not on file  Occupational History   Occupation: retired bookkeeper  Tobacco Use   Smoking status: Former Smoker    Packs/day: 0.10    Years: 2.00    Pack years: 0.20    Types: Cigarettes    Start date: 11/16/1959     Quit date: 11/15/1960    Years since quitting: 59.6   Smokeless tobacco: Never Used  Vaping Use   Vaping Use: Never used  Substance and Sexual Activity   Alcohol use: No    Alcohol/week: 0.0 standard drinks   Drug use: No   Sexual activity: Not on file    Comment: menarche age 32, fist live birth 89, P 3, hysterectomy age 87, no HRT, BCP 2 yrs  Other Topics Concern   Not on file  Social History Narrative   Occupation:  Retired Radiation protection practitioner    Married with 3 grown children      Never Smoked     Alcohol use-no        Social Determinants of Radio broadcast assistant Strain: Low Risk    Difficulty of Paying Living Expenses: Not hard at all  Food Insecurity:    Worried About Charity fundraiser in the Last Year:    Arboriculturist in the Last Year:   Transportation Needs: No Data processing manager (Medical): No   Lack of Transportation (Non-Medical): No  Physical Activity:    Days  of Exercise per Week:    Minutes of Exercise per Session:   Stress:    Feeling of Stress :   Social Connections:    Frequency of Communication with Friends and Family:    Frequency of Social Gatherings with Friends and Family:    Attends Religious Services:    Active Member of Clubs or Organizations:    Attends Music therapist:    Marital Status:   Intimate Partner Violence:    Fear of Current or Ex-Partner:    Emotionally Abused:    Physically Abused:    Sexually Abused:     Past Surgical History:  Procedure Laterality Date   ABDOMINAL HYSTERECTOMY     APPENDECTOMY     BILATERAL TOTAL MASTECTOMY WITH AXILLARY LYMPH NODE DISSECTION  08/30/2013   Dr Barry Dienes   BREAST CYST ASPIRATION     9 cysts   CATARACT EXTRACTION, BILATERAL  2005/2007   CHOLECYSTECTOMY     COLONOSCOPY     CYSTOCELE REPAIR     EVACUATION BREAST HEMATOMA Left 08/31/2013   Procedure: EVACUATION HEMATOMA BREAST;  Surgeon: Stark Klein, MD;  Location: [place];  Service: General;  Laterality: Left;   EYE SURGERY     to repair macular hole   FOOT ARTHROPLASTY     lt    GANGLION CYST EXCISION     rt foot   HEMORRHOID SURGERY     03/1993   JOINT REPLACEMENT  03/15/11   left knee replacement   KNEE ARTHROSCOPY     /partial knee 2016/left knee 2012   MASS EXCISION  11/04/2011   Procedure: EXCISION MASS;  Surgeon: Cammie Sickle., MD;  Location: [place];  Service: Orthopedics;  Laterality: Right;  excisional biopsy right ulna mass   MASTECTOMY W/ SENTINEL NODE BIOPSY Right 08/30/2013   Procedure: RIGHT  AXILLARY SENTINEL LYMPH NODE BIOPSY; Right Axillary Node Disection;  Surgeon: Stark Klein, MD;  Location: [place];  Service: General;  Laterality: Right;  Right side nuc med 7:00    PARTIAL KNEE ARTHROPLASTY Right 11/03/2015   Procedure: RIGHT KNEE MEDIAL UNICOMPARTMENTAL ARTHROPLASTY ;  Surgeon: Gaynelle Arabian, MD;  Location[facility];  Service: Orthopedics;  Laterality: Right;   RECTOCELE REPAIR     SIMPLE MASTECTOMY WITH AXILLARY SENTINEL NODE BIOPSY Left 08/30/2013   Procedure: Bilateral Breast Mastectomy ;  Surgeon: Stark Klein, MD;  Location: [place];  Service: General;  Laterality: Left;   skin tags removed     breast, panty line, neckline   TOE SURGERY     preventative crossover toe surg/right foot   TOE SURGERY  2009   left foot/screw  in 2nd toe   TONSILLECTOMY     UPPER GASTROINTESTINAL ENDOSCOPY      Family History  Problem Relation Age of Onset   Stroke Mother        died age 56   Diabetes Mother    Breast cancer Mother 30   Breast cancer Sister 44   Breast cancer Paternal Aunt 57   Diabetes Maternal Grandfather    Breast cancer Paternal Grandmother 40   Breast cancer Paternal Aunt        dx in her 41s   Cancer Maternal Grandmother        intra-abdominal cancer   Brain cancer Maternal Uncle 8   Brain cancer Cousin 37       maternal cousin   Brain cancer Cousin 56  paternal cousin   Colon cancer Neg Hx     No Known Allergies  Current Outpatient Medications on File Prior to Visit  Medication Sig Dispense Refill   butalbital-acetaminophen-caffeine (FIORICET) 50-325-40 MG tablet Take 1 tablet by mouth as needed for headache.     Calcium Carbonate-Vitamin D (CALCIUM-VITAMIN D3 PO) Take 1 tablet by mouth in the morning and at bedtime.     Cholecalciferol (VITAMIN D3) 50 MCG (2000 UT) TABS Take 1 tablet by mouth daily.      diazepam (VALIUM) 5 MG tablet Take 5 mg by mouth every 6 (six) hours as needed for anxiety. As needed      diclofenac Sodium (VOLTAREN) 1 % GEL Apply topically as needed.     esomeprazole (NEXIUM) 40 MG capsule Take 1 capsule by mouth  daily (Patient taking differently: Take 40 mg by mouth daily before breakfast. Take 1 capsule by mouth  daily) 30 capsule 6   famotidine (PEPCID) 40 MG tablet Take 1 tablet (40 mg total) by mouth at bedtime. 30 tablet 6   levothyroxine (SYNTHROID) 100 MCG tablet Take 1 tablet (100 mcg total) by mouth daily. 365 tablet 0   Lutein-Zeaxanthin 25-5 MG CAPS Take 1 capsule by mouth daily.     MELATONIN PO Take 1 tablet by mouth daily.     simvastatin (ZOCOR) 10 MG tablet Take 1 tablet (10 mg total) by mouth at bedtime. 365 tablet 0   sucralfate (CARAFATE) 1 g tablet Take 1 tablet (1 g total) by mouth 4 (four) times daily -  with meals and at bedtime. 120 tablet 1   topiramate (TOPAMAX) 25 MG tablet Take 1 tablet (25 mg total) by mouth daily. 325 tablet 0   traMADol (ULTRAM) 50 MG tablet Take 50 mg by mouth every 6 (six) hours as needed.     venlafaxine XR (EFFEXOR-XR) 37.5 MG 24 hr capsule Take 1 capsule (37.5 mg total) by mouth daily with breakfast. 90 capsule 0   pregabalin (LYRICA) 150 MG capsule Take 1 capsule (150 mg total) by mouth 2 (two) times daily. (Patient not taking: Reported on 05/16/2020) 180 capsule 1   No current facility-administered medications on file prior to visit.    BP  130/78    Temp 98.4 F (36.9 C)    Wt 163 lb (73.9 kg)    BMI 32.92 kg/m       Objective:   Physical Exam Vitals and nursing note reviewed.  Constitutional:      Appearance: Normal appearance.  Cardiovascular:     Rate and Rhythm: Normal rate and regular rhythm.     Pulses: Normal pulses.     Heart sounds: Normal heart sounds.  Pulmonary:     Effort: Pulmonary effort is normal.     Breath sounds: Normal breath sounds.  Musculoskeletal:        General: Normal range of motion.  Skin:    General: Skin is warm and dry.  Neurological:     General: No focal deficit present.     Mental Status: She is alert and oriented to person, place, and time.  Psychiatric:        Mood and Affect: Mood normal.        Behavior: Behavior normal.        Thought Content: Thought content normal.        Judgment: Judgment normal.       Assessment & Plan:  1. Right elbow pain -Resolved.  No loss of range of  motion or decreased grip strength.  No pain with palpation.  2. Medication refill  - omega-3 acid ethyl esters (LOVAZA) 1 g capsule; Take 1 capsule (1 g total) by mouth 2 (two) times daily.  Dispense: 180 capsule; Refill: 3  [name], NP

## 2020-06-20 ENCOUNTER — Other Ambulatory Visit: Payer: Self-pay | Admitting: Adult Health

## 2020-06-20 ENCOUNTER — Encounter: Payer: Self-pay | Admitting: Adult Health

## 2020-06-20 MED ORDER — METHYLPREDNISOLONE 4 MG PO TBPK
ORAL_TABLET | ORAL | 0 refills | Status: DC
Start: 2020-06-20 — End: 2020-07-29

## 2020-06-30 NOTE — Progress Notes (Signed)
Office Visit Note  Patient: Jean Nash             Date of Birth: [birth year]           MRN: [id number]             PCP: [name], NP Referring: [name], NP Visit Date: 07/02/2020 Occupation: @GUAROCC @  Subjective:  Bilateral CM joint pain   History of Present Illness: Jean Nash is a 79 y.o. female with history of fibromyalgia and osteoarthritis.  Patient presents today with increased pain in both CMC joints.  She has noticed some inflammation in the left [facility] joint.  She continues to admit on a daily basis but her discomfort has been limiting the duration she is able to make.  She denies any discomfort in her wrist joints at this time.  She continues to have chronic lower back pain and has gone to physical therapy without much improvement.  She has not been performing home exercises recently.  She experiences occasional discomfort in the right knee which is partially replaced. She states overall her fibromyalgia has been well controlled.  She has been exercising in the pool for at least 1 hour every day.  Activities of Daily Living:   Patient reports morning stiffness for several hours.   Patient Reports nocturnal pain.  Difficulty dressing/grooming: Reports Difficulty climbing stairs: Denies Difficulty getting out of chair: Denies Difficulty using hands for taps, buttons, cutlery, and/or writing: Reports  Review of Systems  Constitutional: Positive for fatigue.  HENT: Negative for mouth dryness and nose dryness.   Eyes: Negative for itching and dryness.  Respiratory: Negative for shortness of breath and difficulty breathing.   Cardiovascular: Negative for chest pain and palpitations.  Gastrointestinal: Positive for blood in stool. Negative for abdominal pain, constipation and diarrhea.  Endocrine: Negative for increased urination.  Genitourinary: Negative for difficulty urinating.  Musculoskeletal: Positive for arthralgias, joint pain, joint swelling and  morning stiffness. Negative for myalgias, muscle tenderness and myalgias.  Skin: Negative for color change, rash and redness.  Allergic/Immunologic: Negative for susceptible to infections.  Neurological: Negative for dizziness, numbness, headaches, memory loss and weakness.  Hematological: Negative for bruising/bleeding tendency.  Psychiatric/Behavioral: Negative for confusion.    PMFS History:  Patient Active Problem List   Diagnosis Date Noted  . Fibromyalgia   . MDD (major depressive disorder), recurrent, in full remission ([place]) 12/10/2019  . Pulmonary fibrosis ([place]) 12/04/2018  . MDD (major depressive disorder), recurrent episode, mild ([place]) 12/22/2017  . Osteoporosis 07/14/2017  . OA (osteoarthritis) of knee 11/03/2015  . Osteopenia 08/08/2015  . Headache 02/03/2015  . Atypical chest pain 07/15/2014  . Arthralgia 02/22/2014  . Psoriasis 02/22/2014  . Malignant neoplasm of lower-outer quadrant of right breast of female, estrogen receptor positive ([place]) 08/09/2013  . Neck pain 06/22/2013  . Left knee pain 11/29/2012  . Reactive depression (situational) 04/26/2012  . Right wrist pain 02/02/2012  . Right foot pain 10/04/2011  . Nevus 06/05/2011  . Status post total knee replacement 04/06/2011  . Overactive bladder 03/14/2011  . KNEE PAIN, LEFT 08/31/2010  . HEARING LOSS 08/17/2010  . HIRSUTISM 06/16/2009  . CYST, IDIOPATHIC 05/20/2008  . IRRITABLE BOWEL SYNDROME 04/15/2008  . Allergic asthma, mild intermittent, uncomplicated 42/70/6237  . GERD 03/19/2008  . COLONIC POLYPS, HX OF 03/19/2008  . NEPHROLITHIASIS, HX OF 03/19/2008  . Hypothyroidism 03/18/2008  . Hyperlipidemia 03/18/2008  . INSOMNIA, CHRONIC 03/18/2008  . FIBROMYALGIA 03/18/2008  . Prediabetes 03/18/2008    Past  Medical History:  Diagnosis Date  . Adenomatous colon polyp   . Anxiety    PHOBIAS  . Arthritis   . Breast cancer ([place]) 08/08/13   right LOQ  . Cataract   . Chronic insomnia   . Cluster  headaches    history of migraines / NONE FOR SEVERAL YRS  . Depression   . Diverticulosis   . Fatty liver 2011  . Fibromyalgia   . GERD (gastroesophageal reflux disease)   . H/O hiatal hernia   . History of transfusion 08/30/2013  . Hx of radiation therapy 10/29/13- 12/14/13   right chest wall 5040 cGy 28 sessions, right supraclavicular/axillary region 5040 cGy 28 sessions, right chest wall boost 1000 cGy 5 sessions  . Hypothyroidism   . Internal hemorrhoids   . Irritable bowel syndrome   . Kidney stone   . Lymphedema    RT ARM - WEARS SLEEVE  . Macular degeneration    hole/right eye  . MDD (major depressive disorder)   . Osteopenia   . Other abnormal glucose   . Other and unspecified hyperlipidemia   . Pain in joint, shoulder region   . Pneumonia 1610,9604  . Sleep apnea    USES C-PAP  . Stress incontinence, female     Family History  Problem Relation Age of Onset  . Stroke Mother        died age 54  . Diabetes Mother   . Breast cancer Mother 56  . Breast cancer Sister 68  . Breast cancer Paternal Aunt 81  . Diabetes Maternal Grandfather   . Breast cancer Paternal Grandmother 67  . Breast cancer Paternal Aunt        dx in her 39s  . Cancer Maternal Grandmother        intra-abdominal cancer  . Brain cancer Maternal Uncle 8  . Brain cancer Cousin 25       maternal cousin  . Brain cancer Cousin 20       paternal cousin  . Colon cancer Neg Hx    Past Surgical History:  Procedure Laterality Date  . ABDOMINAL HYSTERECTOMY    . APPENDECTOMY    . BILATERAL TOTAL MASTECTOMY WITH AXILLARY LYMPH NODE DISSECTION  08/30/2013   Dr Barry Dienes  . BREAST CYST ASPIRATION     9 cysts  . CATARACT EXTRACTION, BILATERAL  2005/2007  . CHOLECYSTECTOMY    . COLONOSCOPY    . CYSTOCELE REPAIR    . EVACUATION BREAST HEMATOMA Left 08/31/2013   Procedure: EVACUATION HEMATOMA BREAST;  Surgeon: Stark Klein, MD;  Location: [place];  Service: General;  Laterality: Left;  . EYE SURGERY      to repair macular hole  . FOOT ARTHROPLASTY     lt   . GANGLION CYST EXCISION     rt foot  . HEMORRHOID SURGERY     03/1993  . JOINT REPLACEMENT  03/15/11   left knee replacement  . KNEE ARTHROSCOPY     /partial knee 2016/left knee 2012  . MASS EXCISION  11/04/2011   Procedure: EXCISION MASS;  Surgeon: Cammie Sickle., MD;  Location: [place];  Service: Orthopedics;  Laterality: Right;  excisional biopsy right ulna mass  . MASTECTOMY W/ SENTINEL NODE BIOPSY Right 08/30/2013   Procedure: RIGHT  AXILLARY SENTINEL LYMPH NODE BIOPSY; Right Axillary Node Disection;  Surgeon: Stark Klein, MD;  Location: [place];  Service: General;  Laterality: Right;  Right side nuc med 7:00   . PARTIAL  KNEE ARTHROPLASTY Right 11/03/2015   Procedure: RIGHT KNEE MEDIAL UNICOMPARTMENTAL ARTHROPLASTY ;  Surgeon: Gaynelle Arabian, MD;  [facility];  Service: Orthopedics;  Laterality: Right;  . RECTOCELE REPAIR    . SIMPLE MASTECTOMY WITH AXILLARY SENTINEL NODE BIOPSY Left 08/30/2013   Procedure: Bilateral Breast Mastectomy ;  Surgeon: Stark Klein, MD;  Location: [place];  Service: General;  Laterality: Left;  . skin tags removed     breast, panty line, neckline  . TOE SURGERY     preventative crossover toe surg/right foot  . TOE SURGERY  2009   left foot/screw  in 2nd toe  . TONSILLECTOMY    . UPPER GASTROINTESTINAL ENDOSCOPY     Social History   Social History Narrative   Occupation:  Retired Radiation protection practitioner    Married with 3 grown children      Never Smoked     Alcohol use-no        Immunization History  Administered Date(s) Administered  . Fluad Quad(high Dose 65+) 07/13/2019  . Influenza Split 07/27/2012  . Influenza Whole 09/13/2007, 08/08/2008, 07/30/2009, 08/25/2010  . Influenza, High Dose Seasonal PF 08/03/2014, 08/08/2015, 08/23/2016, 07/14/2017, 07/20/2018  . Influenza,inj,Quad PF,6+ Mos 07/25/2013  . PFIZER SARS-COV-2 Vaccination 12/23/2019, 01/17/2020  . Pneumococcal  Conjugate-13 08/16/2013  . Pneumococcal Polysaccharide-23 06/18/2008  . Tdap 11/29/2012  . Zoster 08/05/2011  . Zoster Recombinat (Shingrix) 04/11/2018, 09/22/2018     Objective: Vital Signs: BP 137/81 (BP Location: Left Arm, Patient Position: Sitting, Cuff Size: Normal)   Pulse 67   Resp 16   Ht 4\' 11"  (1.499 m)   Wt 166 lb (75.3 kg)   BMI 33.53 kg/m    Physical Exam Vitals and nursing note reviewed.  Constitutional:      Appearance: She is well-developed.  HENT:     Head: Normocephalic and atraumatic.  Eyes:     Conjunctiva/sclera: Conjunctivae normal.  Pulmonary:     Effort: Pulmonary effort is normal.  Abdominal:     Palpations: Abdomen is soft.  Musculoskeletal:     Cervical back: Normal range of motion.  Skin:    General: Skin is warm and dry.     Capillary Refill: Capillary refill takes less than 2 seconds.  Neurological:     Mental Status: She is alert and oriented to person, place, and time.  Psychiatric:        Behavior: Behavior normal.      Musculoskeletal Exam: C-spine good ROM.  Postural thoracic kyphosis.  Limited and painful ROM of lumbar spine.  Shoulder joints and elbow joints good ROM.  No tenderness or inflammation of wrist joints.  CMC joint prominence, L>R.  Tenderness of both CMC joints on exam.  No tenderness or synovitis of MCP joints.  Left total knee replacement has good ROM.  Right partial knee replacement good ROM.  Ankle joints good ROM with no tenderness or inflammation.    CDAI Exam: CDAI Score: -- Patient Global: --; Provider Global: -- Swollen: --; Tender: -- Joint Exam 07/02/2020   No joint exam has been documented for this visit   There is currently no information documented on the homunculus. Go to the Rheumatology activity and complete the homunculus joint exam.  Investigation: No additional findings.  Imaging: No results found.  Recent Labs: Lab Results  Component Value Date   WBC 8.1 04/21/2020   HGB 15.7 (H)  04/21/2020   PLT 185.0 04/21/2020   NA 141 04/09/2020   K 4.2 04/09/2020   CL 106  04/09/2020   CO2 23 04/09/2020   GLUCOSE 93 04/09/2020   BUN 13 04/09/2020   CREATININE 0.66 04/09/2020   BILITOT 0.7 04/09/2020   ALKPHOS 56 04/09/2020   AST 22 04/09/2020   ALT 23 04/09/2020   PROT 6.9 04/09/2020   ALBUMIN 4.2 04/09/2020   CALCIUM 9.3 04/09/2020   GFRAA >60 04/09/2020    Speciality Comments: No specialty comments available.  Procedures:  Small Joint Inj: bilateral thumb CMC on 07/02/2020 9:54 AM Indications: pain Details: 27 G needle, ultrasound-guided radial approach Medications (Right): 0.5 mL lidocaine 1 %; 10 mg triamcinolone acetonide 40 MG/ML Aspirate (Right): 0 mL Medications (Left): 0.5 mL lidocaine 1 %; 10 mg triamcinolone acetonide 40 MG/ML Aspirate (Left): 0 mL Procedure, treatment alternatives, risks and benefits explained, specific risks discussed. Consent was given by the patient. Immediately prior to procedure a time out was called to verify the correct patient, procedure, equipment, support staff and site/side marked as required. Patient was prepped and draped in the usual sterile fashion.     Allergies: Patient has no known allergies.   Assessment / Plan:     Visit Diagnoses: Fibromyalgia: She has not had any recent fibromyalgia flares.  She has no tender points on examination today.  Her myalgias have been tolerable.  She has been performing water exercises for at least 1 hour on a daily basis which has been beneficial.  We discussed the importance of regular exercise and good sleep hygiene.  Primary osteoarthritis of both hands: She presents today with severe pain in both CMC joints.  She has [place] joint prominence and tenderness to palpation on exam.  She has some inflammation in the left CMC joint.  She requested CMC joint cortisone injections today.  She tolerated the procedure well.  The procedure note was completed above.  She was given a prescription for [facility]  joint braces as well as compression arthritis gloves to use for comfort and support.  She was also given a list of natural anti-inflammatories to try to start taking.  She was advised to avoid turmeric due to the mild blood thinning properties.  She was also given a handout of hand exercises to perform.  We discussed the importance of joint protection and muscle strengthening.  She was encouraged to continue to try to meet on a daily basis to help with her joint discomfort and stiffness.  Status post total left knee replacement: Doing well.  She has good range of motion with no discomfort on exam.  Status post right partial knee replacement: She experiences intermittent discomfort in the right knee which is partially replaced.  She has good range of motion on examination today.  Other medical conditions are listed as follows:  Pulmonary fibrosis (HCC)  Allergic asthma, mild intermittent, uncomplicated  History of hypothyroidism  Malignant neoplasm of lower-outer quadrant of right breast of female, estrogen receptor positive ([place])  Prediabetes  History of colonic polyps  History of nephrolithiasis  History of IBS  History of gastroesophageal reflux (GERD)  MDD (major depressive disorder), recurrent episode, mild ([place])  History of hyperlipidemia  Orders: Orders Placed This Encounter  Procedures  . Small Joint Inj   No orders of the defined types were placed in this encounter.   Face-to-face time spent with patient was 30 minutes. Greater than 50% of time was spent in counseling and coordination of care.  Follow-Up Instructions: Return in about 6 months (around 01/02/2021) for Fibromyalgia, Osteoarthritis.   Ofilia Neas, PA-C   I  examined and evaluated the patient with Hazel Sams PA. The plan of care was discussed as noted above.  Bo Merino, MD  Note - This record has been created using Editor, commissioning.  Chart creation errors have been sought, but may not  always  have been located. Such creation errors do not reflect on  the standard of medical care.

## 2020-07-02 ENCOUNTER — Ambulatory Visit (INDEPENDENT_AMBULATORY_CARE_PROVIDER_SITE_OTHER): Payer: Medicare HMO | Admitting: Physician Assistant

## 2020-07-02 ENCOUNTER — Encounter: Payer: Self-pay | Admitting: Physician Assistant

## 2020-07-02 ENCOUNTER — Other Ambulatory Visit: Payer: Self-pay

## 2020-07-02 VITALS — BP 137/81 | HR 67 | Resp 16 | Ht 59.0 in | Wt 166.0 lb

## 2020-07-02 DIAGNOSIS — J4520 Mild intermittent asthma, uncomplicated: Secondary | ICD-10-CM

## 2020-07-02 DIAGNOSIS — R7303 Prediabetes: Secondary | ICD-10-CM

## 2020-07-02 DIAGNOSIS — F330 Major depressive disorder, recurrent, mild: Secondary | ICD-10-CM

## 2020-07-02 DIAGNOSIS — Z87442 Personal history of urinary calculi: Secondary | ICD-10-CM

## 2020-07-02 DIAGNOSIS — M180 Bilateral primary osteoarthritis of first carpometacarpal joints: Secondary | ICD-10-CM

## 2020-07-02 DIAGNOSIS — Z96651 Presence of right artificial knee joint: Secondary | ICD-10-CM

## 2020-07-02 DIAGNOSIS — Z96652 Presence of left artificial knee joint: Secondary | ICD-10-CM

## 2020-07-02 DIAGNOSIS — M797 Fibromyalgia: Secondary | ICD-10-CM

## 2020-07-02 DIAGNOSIS — M19041 Primary osteoarthritis, right hand: Secondary | ICD-10-CM | POA: Diagnosis not present

## 2020-07-02 DIAGNOSIS — Z170 Estrogen receptor positive status [ER+]: Secondary | ICD-10-CM

## 2020-07-02 DIAGNOSIS — C50511 Malignant neoplasm of lower-outer quadrant of right female breast: Secondary | ICD-10-CM

## 2020-07-02 DIAGNOSIS — J8410 Pulmonary fibrosis, unspecified: Secondary | ICD-10-CM

## 2020-07-02 DIAGNOSIS — Z8719 Personal history of other diseases of the digestive system: Secondary | ICD-10-CM

## 2020-07-02 DIAGNOSIS — Z96659 Presence of unspecified artificial knee joint: Secondary | ICD-10-CM

## 2020-07-02 DIAGNOSIS — Z8639 Personal history of other endocrine, nutritional and metabolic disease: Secondary | ICD-10-CM

## 2020-07-02 DIAGNOSIS — Z86010 Personal history of colonic polyps: Secondary | ICD-10-CM

## 2020-07-02 DIAGNOSIS — M19042 Primary osteoarthritis, left hand: Secondary | ICD-10-CM

## 2020-07-02 MED ORDER — TRIAMCINOLONE ACETONIDE 40 MG/ML IJ SUSP
10.0000 mg | INTRAMUSCULAR | Status: AC | PRN
  Administered 2020-07-02: 10 mg via INTRA_ARTICULAR

## 2020-07-02 MED ORDER — LIDOCAINE HCL 1 % IJ SOLN
0.5000 mL | INTRAMUSCULAR | Status: AC | PRN
  Administered 2020-07-02: .5 mL

## 2020-07-02 NOTE — Patient Instructions (Signed)
Hand Exercises Hand exercises can be helpful for almost anyone. These exercises can strengthen the hands, improve flexibility and movement, and increase blood flow to the hands. These results can make work and daily tasks easier. Hand exercises can be especially helpful for people who have joint pain from arthritis or have nerve damage from overuse (carpal tunnel syndrome). These exercises can also help people who have injured a hand. Exercises Most of these hand exercises are gentle stretching and motion exercises. It is usually safe to do them often throughout the day. Warming up your hands before exercise may help to reduce stiffness. You can do this with gentle massage or by placing your hands in warm water for 10-15 minutes. It is normal to feel some stretching, pulling, tightness, or mild discomfort as you begin new exercises. This will gradually improve. Stop an exercise right away if you feel sudden, severe pain or your pain gets worse. Ask your health care provider which exercises are best for you. Knuckle bend or "claw" fist 1. Stand or sit with your arm, hand, and all five fingers pointed straight up. Make sure to keep your wrist straight during the exercise. 2. Gently bend your fingers down toward your palm until the tips of your fingers are touching the top of your palm. Keep your big knuckle straight and just bend the small knuckles in your fingers. 3. Hold this position for __________ seconds. 4. Straighten (extend) your fingers back to the starting position. Repeat this exercise 5-10 times with each hand. Full finger fist 1. Stand or sit with your arm, hand, and all five fingers pointed straight up. Make sure to keep your wrist straight during the exercise. 2. Gently bend your fingers into your palm until the tips of your fingers are touching the middle of your palm. 3. Hold this position for __________ seconds. 4. Extend your fingers back to the starting position, stretching every  joint fully. Repeat this exercise 5-10 times with each hand. Straight fist 1. Stand or sit with your arm, hand, and all five fingers pointed straight up. Make sure to keep your wrist straight during the exercise. 2. Gently bend your fingers at the big knuckle, where your fingers meet your hand, and the middle knuckle. Keep the knuckle at the tips of your fingers straight and try to touch the bottom of your palm. 3. Hold this position for __________ seconds. 4. Extend your fingers back to the starting position, stretching every joint fully. Repeat this exercise 5-10 times with each hand. Tabletop 1. Stand or sit with your arm, hand, and all five fingers pointed straight up. Make sure to keep your wrist straight during the exercise. 2. Gently bend your fingers at the big knuckle, where your fingers meet your hand, as far down as you can while keeping the small knuckles in your fingers straight. Think of forming a tabletop with your fingers. 3. Hold this position for __________ seconds. 4. Extend your fingers back to the starting position, stretching every joint fully. Repeat this exercise 5-10 times with each hand. Finger spread 1. Place your hand flat on a table with your palm facing down. Make sure your wrist stays straight as you do this exercise. 2. Spread your fingers and thumb apart from each other as far as you can until you feel a gentle stretch. Hold this position for __________ seconds. 3. Bring your fingers and thumb tight together again. Hold this position for __________ seconds. Repeat this exercise 5-10 times with each hand.   Making circles 1. Stand or sit with your arm, hand, and all five fingers pointed straight up. Make sure to keep your wrist straight during the exercise. 2. Make a circle by touching the tip of your thumb to the tip of your index finger. 3. Hold for __________ seconds. Then open your hand wide. 4. Repeat this motion with your thumb and each finger on your  hand. Repeat this exercise 5-10 times with each hand. Thumb motion 1. Sit with your forearm resting on a table and your wrist straight. Your thumb should be facing up toward the ceiling. Keep your fingers relaxed as you move your thumb. 2. Lift your thumb up as high as you can toward the ceiling. Hold for __________ seconds. 3. Bend your thumb across your palm as far as you can, reaching the tip of your thumb for the small finger (pinkie) side of your palm. Hold for __________ seconds. Repeat this exercise 5-10 times with each hand. Grip strengthening  1. Hold a stress ball or other soft ball in the middle of your hand. 2. Slowly increase the pressure, squeezing the ball as much as you can without causing pain. Think of bringing the tips of your fingers into the middle of your palm. All of your finger joints should bend when doing this exercise. 3. Hold your squeeze for __________ seconds, then relax. Repeat this exercise 5-10 times with each hand. Contact a health care provider if:  Your hand pain or discomfort gets much worse when you do an exercise.  Your hand pain or discomfort does not improve within 2 hours after you exercise. If you have any of these problems, stop doing these exercises right away. Do not do them again unless your health care provider says that you can. Get help right away if:  You develop sudden, severe hand pain or swelling. If this happens, stop doing these exercises right away. Do not do them again unless your health care provider says that you can. This information is not intended to replace advice given to you by your health care provider. Make sure you discuss any questions you have with your health care provider. Document Revised: 02/22/2019 Document Reviewed: 11/02/2018 Elsevier Patient Education  2020 [employer]  

## 2020-07-15 ENCOUNTER — Encounter: Payer: Self-pay | Admitting: Adult Health

## 2020-07-29 ENCOUNTER — Other Ambulatory Visit: Payer: Self-pay

## 2020-07-29 ENCOUNTER — Ambulatory Visit (INDEPENDENT_AMBULATORY_CARE_PROVIDER_SITE_OTHER): Payer: Medicare HMO | Admitting: Adult Health

## 2020-07-29 ENCOUNTER — Encounter: Payer: Self-pay | Admitting: Adult Health

## 2020-07-29 VITALS — BP 110/70 | HR 84 | Ht 60.0 in | Wt 161.0 lb

## 2020-07-29 DIAGNOSIS — G8929 Other chronic pain: Secondary | ICD-10-CM | POA: Diagnosis not present

## 2020-07-29 DIAGNOSIS — M545 Low back pain: Secondary | ICD-10-CM | POA: Diagnosis not present

## 2020-07-29 MED ORDER — CYCLOBENZAPRINE HCL 5 MG PO TABS: 5.0000 mg | ORAL_TABLET | Freq: Every evening | ORAL | 0 refills | Status: DC | PRN

## 2020-07-29 NOTE — Progress Notes (Signed)
Subjective:    [name], female    DOB: 05/16/1941, 79 y.o.   MRN: [id number]  HPI 79 year old female who  has a past medical history of Adenomatous colon polyp, Anxiety, Arthritis, Breast cancer ([place]) (08/08/13), Cataract, Chronic insomnia, Cluster headaches, Depression, Diverticulosis, Fatty liver (2011), Fibromyalgia, GERD (gastroesophageal reflux disease), H/O hiatal hernia, History of transfusion (08/30/2013), radiation therapy (10/29/13- 12/14/13), Hypothyroidism, Internal hemorrhoids, Irritable bowel syndrome, Kidney stone, Lymphedema, Macular degeneration, MDD (major depressive disorder), Osteopenia, Other abnormal glucose, Other and unspecified hyperlipidemia, Pain in joint, shoulder region, Pneumonia (9622,2979), Sleep apnea, and Stress incontinence, female.  She presents to the office today for follow-up regarding chronic low back pain.  She has done physical therapy in the past without much improvement.  She does not do home exercises besides swimming in her pool and reports "I have to swim through the pain".  She also has chronic bilateral knee pain with history of partial knee replacement in the right and for knee replacement in the left.  She is being seen by orthopedics tomorrow for her bilateral knee pain.  Reports that the back pain is worse with changing positions and she has to get up slowly to minimize the discomfort in her low back and bilateral knees.  She denies sciatica.   When she sits her Jacuzzi the pain resolves.  Review of Systems See HPI   Past Medical History:  Diagnosis Date  . Adenomatous colon polyp   . Anxiety    PHOBIAS  . Arthritis   . Breast cancer ([place]) 08/08/13   right LOQ  . Cataract   . Chronic insomnia   . Cluster headaches    history of migraines / NONE FOR SEVERAL YRS  . Depression   . Diverticulosis   . Fatty liver 2011  . Fibromyalgia   . GERD (gastroesophageal reflux disease)   . H/O hiatal hernia   . History of  transfusion 08/30/2013  . Hx of radiation therapy 10/29/13- 12/14/13   right chest wall 5040 cGy 28 sessions, right supraclavicular/axillary region 5040 cGy 28 sessions, right chest wall boost 1000 cGy 5 sessions  . Hypothyroidism   . Internal hemorrhoids   . Irritable bowel syndrome   . Kidney stone   . Lymphedema    RT ARM - WEARS SLEEVE  . Macular degeneration    hole/right eye  . MDD (major depressive disorder)   . Osteopenia   . Other abnormal glucose   . Other and unspecified hyperlipidemia   . Pain in joint, shoulder region   . Pneumonia 8921,1941  . Sleep apnea    USES C-PAP  . Stress incontinence, female     Social History   Socioeconomic History  . Marital status: Married    Spouse name: Not on file  . Number of children: 3  . Years of education: Not on file  . Highest education level: Not on file  Occupational History  . Occupation: retired bookkeeper  Tobacco Use  . Smoking status: Former Smoker    Packs/day: 0.10    Years: 2.00    Pack years: 0.20    Types: Cigarettes    Start date: 11/16/1959    Quit date: 11/15/1960    Years since quitting: 59.7  . Smokeless tobacco: Never Used  Vaping Use  . Vaping Use: Never used  Substance and Sexual Activity  . Alcohol use: Yes    Comment: occ  . Drug use: No  . Sexual activity: Not on  file    Comment: menarche age 65, fist live birth 72, P 3, hysterectomy age 47, no HRT, BCP 2 yrs  Other Topics Concern  . Not on file  Social History Narrative   Occupation:  Retired Radiation protection practitioner    Married with 3 grown children      Never Smoked     Alcohol use-no        Social Determinants of Radio broadcast assistant Strain: Low Risk   . Difficulty of Paying Living Expenses: Not hard at all  Food Insecurity:   . Worried About Charity fundraiser in the Last Year: Not on file  . Ran Out of Food in the Last Year: Not on file  Transportation Needs: No Transportation Needs  . Lack of Transportation (Medical): No  . Lack  of Transportation (Non-Medical): No  Physical Activity:   . Days of Exercise per Week: Not on file  . Minutes of Exercise per Session: Not on file  Stress:   . Feeling of Stress : Not on file  Social Connections:   . Frequency of Communication with Friends and Family: Not on file  . Frequency of Social Gatherings with Friends and Family: Not on file  . Attends Religious Services: Not on file  . Active Member of Clubs or Organizations: Not on file  . Attends Archivist Meetings: Not on file  . Marital Status: Not on file  Intimate Partner Violence:   . Fear of Current or Ex-Partner: Not on file  . Emotionally Abused: Not on file  . Physically Abused: Not on file  . Sexually Abused: Not on file    Past Surgical History:  Procedure Laterality Date  . ABDOMINAL HYSTERECTOMY    . APPENDECTOMY    . BILATERAL TOTAL MASTECTOMY WITH AXILLARY LYMPH NODE DISSECTION  08/30/2013   Dr Barry Dienes  . BREAST CYST ASPIRATION     9 cysts  . CATARACT EXTRACTION, BILATERAL  2005/2007  . CHOLECYSTECTOMY    . COLONOSCOPY    . CYSTOCELE REPAIR    . EVACUATION BREAST HEMATOMA Left 08/31/2013   Procedure: EVACUATION HEMATOMA BREAST;  Surgeon: Stark Klein, MD;  Location: [facility];  Service: General;  Laterality: Left;  . EYE SURGERY     to repair macular hole  . FOOT ARTHROPLASTY     lt   . GANGLION CYST EXCISION     rt foot  . HEMORRHOID SURGERY     03/1993  . JOINT REPLACEMENT  03/15/11   left knee replacement  . KNEE ARTHROSCOPY     /partial knee 2016/left knee 2012  . MASS EXCISION  11/04/2011   Procedure: EXCISION MASS;  Surgeon: Cammie Sickle., MD;  Location: [facility];  Service: Orthopedics;  Laterality: Right;  excisional biopsy right ulna mass  . MASTECTOMY W/ SENTINEL NODE BIOPSY Right 08/30/2013   Procedure: RIGHT  AXILLARY SENTINEL LYMPH NODE BIOPSY; Right Axillary Node Disection;  Surgeon: Stark Klein, MD;  Location: [facility];  Service: General;  Laterality:  Right;  Right side nuc med 7:00   . PARTIAL KNEE ARTHROPLASTY Right 11/03/2015   Procedure: RIGHT KNEE MEDIAL UNICOMPARTMENTAL ARTHROPLASTY ;  Surgeon: Gaynelle Arabian, MD;  Location: [facility];  Service: Orthopedics;  Laterality: Right;  . RECTOCELE REPAIR    . SIMPLE MASTECTOMY WITH AXILLARY SENTINEL NODE BIOPSY Left 08/30/2013   Procedure: Bilateral Breast Mastectomy ;  Surgeon: Stark Klein, MD;  Location: [facility];  Service: General;  Laterality: Left;  .  skin tags removed     breast, panty line, neckline  . TOE SURGERY     preventative crossover toe surg/right foot  . TOE SURGERY  2009   left foot/screw  in 2nd toe  . TONSILLECTOMY    . UPPER GASTROINTESTINAL ENDOSCOPY      Family History  Problem Relation Age of Onset  . Stroke Mother        died age 67  . Diabetes Mother   . Breast cancer Mother 74  . Breast cancer Sister 80  . Breast cancer Paternal Aunt 73  . Diabetes Maternal Grandfather   . Breast cancer Paternal Grandmother 73  . Breast cancer Paternal Aunt        dx in her 56s  . Cancer Maternal Grandmother        intra-abdominal cancer  . Brain cancer Maternal Uncle 8  . Brain cancer Cousin 26       maternal cousin  . Brain cancer Cousin 20       paternal cousin  . Colon cancer Neg Hx     No Known Allergies  Current Outpatient Medications on File Prior to Visit  Medication Sig Dispense Refill  . butalbital-acetaminophen-caffeine (FIORICET) 50-325-40 MG tablet Take 1 tablet by mouth as needed for headache.    . Calcium Carbonate-Vitamin D (CALCIUM-VITAMIN D3 PO) Take 1 tablet by mouth in the morning and at bedtime.    . Cholecalciferol (VITAMIN D3) 50 MCG (2000 UT) TABS Take 1 tablet by mouth daily.     . diazepam (VALIUM) 5 MG tablet Take 5 mg by mouth every 6 (six) hours as needed for anxiety. As needed     . esomeprazole (NEXIUM) 40 MG capsule Take 1 capsule by mouth  daily (Patient taking differently: Take 40 mg by mouth daily before breakfast. Take 1 capsule  by mouth  daily) 30 capsule 6  . famotidine (PEPCID) 40 MG tablet Take 1 tablet (40 mg total) by mouth at bedtime. 30 tablet 6  . levothyroxine (SYNTHROID) 100 MCG tablet Take 1 tablet (100 mcg total) by mouth daily. 365 tablet 0  . Lutein-Zeaxanthin 25-5 MG CAPS Take 1 capsule by mouth daily.    Marland Kitchen MELATONIN PO Take 1 tablet by mouth daily.    Marland Kitchen omega-3 acid ethyl esters (LOVAZA) 1 g capsule Take 1 capsule (1 g total) by mouth 2 (two) times daily. 180 capsule 3  . simvastatin (ZOCOR) 10 MG tablet Take 1 tablet (10 mg total) by mouth at bedtime. 365 tablet 0  . topiramate (TOPAMAX) 25 MG tablet Take 1 tablet (25 mg total) by mouth daily. 325 tablet 0  . traMADol (ULTRAM) 50 MG tablet Take 50 mg by mouth every 6 (six) hours as needed.    . diclofenac Sodium (VOLTAREN) 1 % GEL Apply topically as needed.     No current facility-administered medications on file prior to visit.    BP 110/70   Pulse 84   Ht 5' (1.524 m)   Wt 161 lb (73 kg)   SpO2 95%   BMI 31.44 kg/m       Objective:   Physical Exam Vitals and nursing note reviewed.  Constitutional:      Appearance: Normal appearance.  Musculoskeletal:        General: Tenderness present. Normal range of motion.       Back:     Comments: Pain with palpation along lower lumbar paraspinal muscles.  No spinal tenderness or step-offs noted.  Skin:  General: Skin is warm and dry.  Neurological:     General: No focal deficit present.     Mental Status: She is alert and oriented to person, place, and time.  Psychiatric:        Mood and Affect: Mood normal.        Thought Content: Thought content normal.        Judgment: Judgment normal.       Assessment & Plan:  1. Chronic bilateral low back pain without sciatica -Appear to be more muscular in nature.  Advise heating pad and will prescribe a short course of Flexeril.  If no improvement can refer to neurosurgery for further evaluation.  I am wondering if her chronic knee pain is  also the cause of her low back pain due to straining to change positions. - cyclobenzaprine (FLEXERIL) 5 MG tablet; Take 1 tablet (5 mg total) by mouth at bedtime as needed for muscle spasms.  Dispense: 10 tablet; Refill: 0  [name], NP

## 2020-07-31 ENCOUNTER — Ambulatory Visit: Payer: Medicare HMO | Admitting: Rheumatology

## 2020-08-05 ENCOUNTER — Encounter: Payer: Self-pay | Admitting: Genetic Counselor

## 2020-08-05 DIAGNOSIS — Z1379 Encounter for other screening for genetic and chromosomal anomalies: Secondary | ICD-10-CM | POA: Insufficient documentation

## 2020-08-08 ENCOUNTER — Other Ambulatory Visit: Payer: Self-pay

## 2020-08-08 ENCOUNTER — Ambulatory Visit (INDEPENDENT_AMBULATORY_CARE_PROVIDER_SITE_OTHER): Payer: Medicare HMO

## 2020-08-08 DIAGNOSIS — Z0000 Encounter for general adult medical examination without abnormal findings: Secondary | ICD-10-CM

## 2020-08-08 NOTE — Progress Notes (Signed)
Subjective:   Jean Nash is a 79 y.o. female who presents for Medicare Annual (Subsequent) preventive examination.  I connected with [name] today by telephone and verified that I am speaking with the correct person using two identifiers. Location patient: home Location provider: work Persons participating in the virtual visit: patient, provider.   I discussed the limitations, risks, security and privacy concerns of performing an evaluation and management service by telephone and the availability of in person appointments. I also discussed with the patient that there may be a patient responsible charge related to this service. The patient expressed understanding and verbally consented to this telephonic visit.    Interactive audio and video telecommunications were attempted between this provider and patient, however failed, due to patient having technical difficulties OR patient did not have access to video capability.  We continued and completed visit with audio only.     Review of Systems    N/A Cardiac Risk Factors include: advanced age (>3men, >44 women);hypertension     Objective:    Today's Vitals   08/08/20 1359  PainSc: 4    There is no height or weight on file to calculate BMI.  Advanced Directives 08/08/2020 04/09/2020 12/24/2019 08/24/2018 05/31/2018 02/26/2018 02/26/2018  Does Patient Have a Medical Advance Directive? Yes Yes No Yes Yes No No  Type of Paramedic of [place];Living will [place];Living will - - [place];Living will - -  Does patient want to make changes to medical advance directive? No - Patient declined - - - No - Patient declined - -  Copy of [place] in Chart? Yes - validated most recent copy scanned in chart (See row information) - - - No - copy requested - -  Would patient like information on creating a medical advance directive? - - - - - No - Patient  declined -    Current Medications (verified) Outpatient Encounter Medications as of 08/08/2020  Medication Sig  . butalbital-acetaminophen-caffeine (FIORICET) 50-325-40 MG tablet Take 1 tablet by mouth as needed for headache.  . Calcium Carbonate-Vitamin D (CALCIUM-VITAMIN D3 PO) Take 1 tablet by mouth in the morning and at bedtime.  . Cholecalciferol (VITAMIN D3) 50 MCG (2000 UT) TABS Take 1 tablet by mouth daily.   . cyclobenzaprine (FLEXERIL) 5 MG tablet Take 1 tablet (5 mg total) by mouth at bedtime as needed for muscle spasms.  . diazepam (VALIUM) 5 MG tablet Take 5 mg by mouth every 6 (six) hours as needed for anxiety. As needed   . esomeprazole (NEXIUM) 40 MG capsule Take 1 capsule by mouth  daily (Patient taking differently: Take 40 mg by mouth daily before breakfast. Take 1 capsule by mouth  daily)  . famotidine (PEPCID) 40 MG tablet Take 1 tablet (40 mg total) by mouth at bedtime.  Marland Kitchen levothyroxine (SYNTHROID) 100 MCG tablet Take 1 tablet (100 mcg total) by mouth daily.  . Lutein-Zeaxanthin 25-5 MG CAPS Take 1 capsule by mouth daily.  Marland Kitchen MELATONIN PO Take 1 tablet by mouth daily.  Marland Kitchen omega-3 acid ethyl esters (LOVAZA) 1 g capsule Take 1 capsule (1 g total) by mouth 2 (two) times daily.  . simvastatin (ZOCOR) 10 MG tablet Take 1 tablet (10 mg total) by mouth at bedtime.  . topiramate (TOPAMAX) 25 MG tablet Take 1 tablet (25 mg total) by mouth daily.  . traMADol (ULTRAM) 50 MG tablet Take 50 mg by mouth every 6 (six) hours as needed.  No facility-administered encounter medications on file as of 08/08/2020.    Allergies (verified) Patient has no known allergies.   History: Past Medical History:  Diagnosis Date  . Adenomatous colon polyp   . Anxiety    PHOBIAS  . Arthritis   . Breast cancer ([place]) 08/08/13   right LOQ  . Cataract   . Chronic insomnia   . Cluster headaches    history of migraines / NONE FOR SEVERAL YRS  . Depression   . Diverticulosis   . Fatty liver 2011  .  Fibromyalgia   . GERD (gastroesophageal reflux disease)   . H/O hiatal hernia   . History of transfusion 08/30/2013  . Hx of radiation therapy 10/29/13- 12/14/13   right chest wall 5040 cGy 28 sessions, right supraclavicular/axillary region 5040 cGy 28 sessions, right chest wall boost 1000 cGy 5 sessions  . Hypothyroidism   . Internal hemorrhoids   . Irritable bowel syndrome   . Kidney stone   . Lymphedema    RT ARM - WEARS SLEEVE  . Macular degeneration    hole/right eye  . MDD (major depressive disorder)   . Osteopenia   . Other abnormal glucose   . Other and unspecified hyperlipidemia   . Pain in joint, shoulder region   . Pneumonia 1610,9604  . Sleep apnea    USES C-PAP  . Stress incontinence, female    Past Surgical History:  Procedure Laterality Date  . ABDOMINAL HYSTERECTOMY    . APPENDECTOMY    . BILATERAL TOTAL MASTECTOMY WITH AXILLARY LYMPH NODE DISSECTION  08/30/2013   Dr Barry Dienes  . BREAST CYST ASPIRATION     9 cysts  . CATARACT EXTRACTION, BILATERAL  2005/2007  . CHOLECYSTECTOMY    . COLONOSCOPY    . CYSTOCELE REPAIR    . EVACUATION BREAST HEMATOMA Left 08/31/2013   Procedure: EVACUATION HEMATOMA BREAST;  Surgeon: Stark Klein, MD;  Location: [facility];  Service: General;  Laterality: Left;  . EYE SURGERY     to repair macular hole  . FOOT ARTHROPLASTY     lt   . GANGLION CYST EXCISION     rt foot  . HEMORRHOID SURGERY     03/1993  . JOINT REPLACEMENT  03/15/11   left knee replacement  . KNEE ARTHROSCOPY     /partial knee 2016/left knee 2012  . MASS EXCISION  11/04/2011   Procedure: EXCISION MASS;  Surgeon: Cammie Sickle., MD;  Location: [facility];  Service: Orthopedics;  Laterality: Right;  excisional biopsy right ulna mass  . MASTECTOMY W/ SENTINEL NODE BIOPSY Right 08/30/2013   Procedure: RIGHT  AXILLARY SENTINEL LYMPH NODE BIOPSY; Right Axillary Node Disection;  Surgeon: Stark Klein, MD;  Location: [facility];  Service: General;   Laterality: Right;  Right side nuc med 7:00   . PARTIAL KNEE ARTHROPLASTY Right 11/03/2015   Procedure: RIGHT KNEE MEDIAL UNICOMPARTMENTAL ARTHROPLASTY ;  Surgeon: Gaynelle Arabian, MD;  Location: [facility];  Service: Orthopedics;  Laterality: Right;  . RECTOCELE REPAIR    . SIMPLE MASTECTOMY WITH AXILLARY SENTINEL NODE BIOPSY Left 08/30/2013   Procedure: Bilateral Breast Mastectomy ;  Surgeon: Stark Klein, MD;  Location: [facility];  Service: General;  Laterality: Left;  . skin tags removed     breast, panty line, neckline  . TOE SURGERY     preventative crossover toe surg/right foot  . TOE SURGERY  2009   left foot/screw  in 2nd toe  . TONSILLECTOMY    .  UPPER GASTROINTESTINAL ENDOSCOPY     Family History  Problem Relation Age of Onset  . Stroke Mother        died age 42  . Diabetes Mother   . Breast cancer Mother 76  . Breast cancer Sister 62  . Breast cancer Paternal Aunt 11  . Diabetes Maternal Grandfather   . Breast cancer Paternal Grandmother 10  . Breast cancer Paternal Aunt        dx in her 67s  . Cancer Maternal Grandmother        intra-abdominal cancer  . Brain cancer Maternal Uncle 8  . Brain cancer Cousin 60       maternal cousin  . Brain cancer Cousin 20       paternal cousin  . Colon cancer Neg Hx    Social History   Socioeconomic History  . Marital status: Married    Spouse name: Not on file  . Number of children: 3  . Years of education: Not on file  . Highest education level: Not on file  Occupational History  . Occupation: retired bookkeeper  Tobacco Use  . Smoking status: Former Smoker    Packs/day: 0.10    Years: 2.00    Pack years: 0.20    Types: Cigarettes    Start date: 11/16/1959    Quit date: 11/15/1960    Years since quitting: 59.7  . Smokeless tobacco: Never Used  Vaping Use  . Vaping Use: Never used  Substance and Sexual Activity  . Alcohol use: Yes    Comment: occ  . Drug use: No  . Sexual activity: Not on file    Comment: menarche age  54, fist live birth 24, P 3, hysterectomy age 89, no HRT, BCP 2 yrs  Other Topics Concern  . Not on file  Social History Narrative   Occupation:  Retired Radiation protection practitioner    Married with 3 grown children      Never Smoked     Alcohol use-no        Social Determinants of Radio broadcast assistant Strain: Low Risk   . Difficulty of Paying Living Expenses: Not hard at all  Food Insecurity:   . Worried About Charity fundraiser in the Last Year: Not on file  . Ran Out of Food in the Last Year: Not on file  Transportation Needs: No Transportation Needs  . Lack of Transportation (Medical): No  . Lack of Transportation (Non-Medical): No  Physical Activity: Sufficiently Active  . Days of Exercise per Week: 7 days  . Minutes of Exercise per Session: 60 min  Stress: No Stress Concern Present  . Feeling of Stress : Not at all  Social Connections: Moderately Integrated  . Frequency of Communication with Friends and Family: More than three times a week  . Frequency of Social Gatherings with Friends and Family: Twice a week  . Attends Religious Services: More than 4 times per year  . Active Member of Clubs or Organizations: No  . Attends Archivist Meetings: Never  . Marital Status: Married    Tobacco Counseling Counseling given: Not Answered   Clinical Intake:  Pre-visit preparation completed: Yes  Pain : 0-10 Pain Score: 4  Pain Type: Chronic pain Pain Location: Knee Pain Orientation: Right Pain Descriptors / Indicators: Aching Pain Onset: More than a month ago Pain Frequency: Intermittent Pain Relieving Factors: injections  Pain Relieving Factors: injections  Nutritional Risks: None Diabetes: No  How often do you  need to have someone help you when you read instructions, pamphlets, or other written materials from your doctor or pharmacy?: 1 - Never What is the last grade level you completed in school?: Some College  Diabetic?No  Interpreter Needed?:  No  Information entered by :: [place] of Daily Living In your present state of health, do you have any difficulty performing the following activities: 08/08/2020  Hearing? N  Vision? N  Difficulty concentrating or making decisions? N  Walking or climbing stairs? N  Dressing or bathing? N  Doing errands, shopping? Y  Comment Patient no longer drives anymore  Preparing Food and eating ? N  Using the Toilet? N  In the past six months, have you accidently leaked urine? Y  Comment has occassional bladder leakage  Do you have problems with loss of bowel control? N  Managing your Medications? N  Managing your Finances? N  Housekeeping or managing your Housekeeping? N  Some recent data might be hidden    Patient Care Team: [name], NP as PCP - General (Family Medicine) Susa Day, MD as Consulting Physician (Orthopedic Surgery) Earnie Larsson, [facility] as Pharmacist (Pharmacist)  Indicate any recent Medical Services you may have received from other than [facility] providers in the past year (date may be approximate).     Assessment:   This is a routine wellness examination for Jamariya.  Hearing/Vision screen  Hearing Screening   125Hz  250Hz  500Hz  1000Hz  2000Hz  3000Hz  4000Hz  6000Hz  8000Hz   Right ear:           Left ear:           Vision Screening Comments: Patient states gets eyes checked annually   Dietary issues and exercise activities discussed: Current Exercise Habits: Home exercise routine, Type of exercise: Other - see comments (Swimming), Time (Minutes): 60, Frequency (Times/Week): 7, Weekly Exercise (Minutes/Week): 420, Intensity: Moderate, Exercise limited by: orthopedic condition(s)  Goals    . Patient Stated     I would like to live as long my father at 86     . Pharmacy Care Plan     CARE PLAN ENTRY (see longitudinal plan of care for additional care plan information)  Current Barriers:  . Chronic Disease Management support, education, and  care coordination needs related to Hyperlipidemia, Hypothyroidism, Depression, and Prediabetes, Insomnia, Fibromyalgia, Migraine headaches, Osteopenia, Gastritis   Gastritis . Pharmacist Clinical Goal(s) o Over the next 180 days, patient will work with PharmD and providers to minaintain/ minimize abdominal pain.  . Current regimen:  . Esomeprazole 40mg , 1 capsule once daily (take in the morning 30 minutes before breakfast)  . Famotidine 40mg , 1 tablet at bedtime  . Patient self care activities o Patient will continue taking current medications as instructed at the specified time.   Hyperlipidemia Lab Results  Component Value Date/Time   LDLCALC 128 (H) 11/15/2019 09:14 AM   LDLDIRECT 113.0 05/17/2016 07:49 AM   . Pharmacist Clinical Goal(s): o Over the next 180 days, patient will work with PharmD and providers to achieve LDL goal < 100 . Current regimen:   Simvastatin 10mg , 1 tablet at bedtime   Lovaza 1 gram, 1 capsule twice daily  . Interventions: o Recommend to restart taking simvastatin and Lovaza as indicated.  . Patient self care activities - Over the next 180 days, patient will: o Continue current medications as instructed.   Prediabetes Lab Results  Component Value Date/Time   HGBA1C 5.4 10/05/2019 03:49 PM   HGBA1C 6.0 07/14/2017 09:56  AM   HGBA1C 5.8 06/14/2017 12:57 PM   HGBA1C 6.3 08/16/2016 08:50 AM   . Pharmacist Clinical Goal(s): o Over the next 180 days, patient will work with PharmD and providers to maintain A1c goal <6.5% . Current regimen:  o Lifestyle interventions (diet and exercise)  . Patient self care activities - Over the next 180 days, patient will: o Check blood sugar as directed, document, and provide at future appointments  Hypothyroidism . Pharmacist Clinical Goal(s) o Over the next 180 days, patient will work with PharmD and providers to maintain TSH: 0.35 to 4.5 uIU/mL . Current regimen:  o Levothyroxine 177mcg, 1 tablet once daily   . Interventions: o We discussed:  administration time of levothyroxine (at least 30 minutes before first meal or 3 to 4 hours after last meal).   . Patient self care activities - Over the next 180 days, patient will: o Continue current medications as instructed.  Depression . Pharmacist Clinical Goal(s) o Over the next 180 days, patient will work with PharmD and providers to maintain/ improve mood/ depression symptoms . Current regimen:  o venlafaxine XR 37.5mg , 1 capsule daily with breakfast  . Interventions: o Patient to follow up with behavioral health on possibility of tapering off venlafaxine. Venlafaxine should not be abruptly discontinued.  . Patient self care activities o Patient will continue current medication as instructed and follow up with behavioral health.   Imsomnia . Pharmacist Clinical Goal(s) o Over the next 180 days, patient will work with PharmD and providers to improve sleep.  . Current regimen:  o Melatonin, 1 tablet every night  . Patient self care activities o Patient will verify dose taking and confirm at next follow up.   Fibromyalgia . Pharmacist Clinical Goal(s) o Over the next 180 days, patient will work with PharmD and providers to minimize symptoms associated with fibromyalgia  . Current regimen:  o Pregabalin 250mg , 1 capsule twice daily (currently on hold).  . Interventions: o Recommend continue as is and restart with onset of symptoms.  . Patient self care activities o Patient will continue as instructed.   Migraine headaches  . Pharmacist Clinical Goal(s) o Over the next 180 days, patient will work with PharmD and providers to  . Current regimen:  o Butalbital- APAP- caffeine 50-325-40mg , 1 tablet as needed for headache  o Topiramate 25mg , 1 tablet once daily (currently on hold).  . Interventions: o Recommend continue as is and restart with onset of symptoms.  . Patient self care activities o Patient will ontinue as  instructed.  Osteopenia  . Pharmacist Clinical Goal(s) o Over the next 180 days, patient will work with PharmD and providers to prevent bone fractures.  . Current regimen:  . Calcium/ vitmain D 200units/ 58mcg- 1 tablet twice daily  . Vitamin D3 2000 units, 1 tablet once daily  . Interventions: o Recommend [id number] units of vitamin D daily. Recommend 1200 mg of calcium daily from dietary and supplemental sources. . Patient self care activities o Patient will restart supplements.   Osteoarthritis . Pharmacist Clinical Goal(s) o Over the next 180 days, patient will work with PharmD and providers to minimize pain.  . Current regimen:  . Diclofenac 1% gel, apply topically as needed . Tramadol  50mg , 1 tablet every six hours as needed  . Interventions: o We discussed: based on notes from Dr. Fuller Plan and Tommi Rumps patient is to avoid NSAIDs including meloxicam, ibuprofen, naproxen.  o Recommend trial of over-the-counter acetaminophen.  . Patient self care  activities o Patient will continue current medications as instructed.   Medication management . Pharmacist Clinical Goal(s): o Over the next 180 days, patient will work with PharmD and providers to maintain optimal medication adherence . [facility]  . Interventions o Comprehensive medication review performed. o Continue current medication management strategy . Patient self care activities - Over the next 180 days, patient will: o Take medications as prescribed o Report any questions or concerns to PharmD and/or provider(s)  Initial goal documentation     . Weight (lb) < 150 lb (68 kg)      Depression Screen PHQ 2/9 Scores 08/08/2020 07/20/2018 07/14/2017 11/28/2015 10/14/2014 10/01/2013  PHQ - 2 Score 0 0 0 0 1 0  PHQ- 9 Score 0 - 5 - - -    Fall Risk Fall Risk  08/08/2020 07/20/2018 07/28/2017 07/14/2017 11/28/2015  Falls in the past year? 0 No Yes No No  Number falls in past yr: 0 - 1 - -  Injury with Fall? 0 - Yes -  -  Risk for fall due to : Medication side effect - Other (Comment) - -  Risk for fall due to: Comment - - tripped over object on floor - -  Follow up Falls evaluation completed;Falls prevention discussed - - - -    Any stairs in or around the home? No  If so, are there any without handrails? No  Home free of loose throw rugs in walkways, pet beds, electrical cords, etc? Yes  Adequate lighting in your home to reduce risk of falls? Yes   ASSISTIVE DEVICES UTILIZED TO PREVENT FALLS:  Life alert? No  Use of a cane, walker or w/c? No  Grab bars in the bathroom? Yes  Shower chair or bench in shower? No  Elevated toilet seat or a handicapped toilet? No   Cognitive Function:  Cognition intact based on direct observation      Immunizations Immunization History  Administered Date(s) Administered  . Fluad Quad(high Dose 65+) 07/13/2019  . Influenza Split 07/27/2012  . Influenza Whole 09/13/2007, 08/08/2008, 07/30/2009, 08/25/2010  . Influenza, High Dose Seasonal PF 08/03/2014, 08/08/2015, 08/23/2016, 07/14/2017, 07/20/2018  . Influenza,inj,Quad PF,6+ Mos 07/25/2013  . PFIZER SARS-COV-2 Vaccination 12/23/2019, 01/17/2020  . Pneumococcal Conjugate-13 08/16/2013  . Pneumococcal Polysaccharide-23 06/18/2008  . Tdap 11/29/2012  . Zoster 08/05/2011  . Zoster Recombinat (Shingrix) 04/11/2018, 09/22/2018    TDAP status: Up to date Flu Vaccine status: Up to date Pneumococcal vaccine status: Up to date Covid-19 vaccine status: Completed vaccines  Qualifies for Shingles Vaccine? Yes   Zostavax completed Yes   Shingrix Completed?: Yes  Screening Tests Health Maintenance  Topic Date Due  . INFLUENZA VACCINE  06/15/2020  . TETANUS/TDAP  11/29/2022  . DEXA SCAN  Completed  . COVID-19 Vaccine  Completed  . Hepatitis C Screening  Completed  . PNA vac Low Risk Adult  Completed    Health Maintenance  Health Maintenance Due  Topic Date Due  . INFLUENZA VACCINE  06/15/2020     Colorectal cancer screening: No longer required.  Mammogram status: No longer required.  Bone Density status: Completed 08/12/2017. Results reflect: Bone density results: OSTEOPOROSIS. Repeat every 2 years.  Lung Cancer Screening: (Low Dose CT Chest recommended if Age 39-80 years, 30 pack-year currently smoking OR have quit w/in 15years.) does not qualify.   Lung Cancer Screening Referral: N/A  Additional Screening:  Hepatitis C Screening: does qualify; Completed 11/25/2015  Vision Screening: Recommended annual ophthalmology exams for early  detection of glaucoma and other disorders of the eye. Is the patient up to date with their annual eye exam?  Yes  Who is the provider or what is the name of the office in which the patient attends annual eye exams? Dr. Delman Cheadle If pt is not established with a provider, would they like to be referred to a provider to establish care? No .   Dental Screening: Recommended annual dental exams for proper oral hygiene  Community Resource Referral / Chronic Care Management: CRR required this visit?  No   CCM required this visit?  No      Plan:     I have personally reviewed and noted the following in the patient's chart:   . Medical and social history . Use of alcohol, tobacco or illicit drugs  . Current medications and supplements . Functional ability and status . Nutritional status . Physical activity . Advanced directives . List of other physicians . Hospitalizations, surgeries, and ER visits in previous 12 months . Vitals . Screenings to include cognitive, depression, and falls . Referrals and appointments  In addition, I have reviewed and discussed with patient certain preventive protocols, quality metrics, and best practice recommendations. A written personalized care plan for preventive services as well as general preventive health recommendations were provided to patient.     Ofilia Neas, LPN   9/32/[id number]   Nurse Notes:  None

## 2020-08-08 NOTE — Patient Instructions (Signed)
[name] , Thank you for taking time to come for your Medicare Wellness Visit. I appreciate your ongoing commitment to your health goals. Please review the following plan we discussed and let me know if I can assist you in the future.   Screening recommendations/referrals: Colonoscopy: No longer required Mammogram: No longer required Bone Density: No longer required Recommended yearly ophthalmology/optometry visit for glaucoma screening and checkup Recommended yearly dental visit for hygiene and checkup  Vaccinations: Influenza vaccine: Currently due, you may receive at your next office visit or at your local pharmacy Pneumococcal vaccine: Completed series Tdap vaccine: Up to date, next due 11/29/2022 Shingles vaccine: Completed series    Advanced directives: Copies on file  Conditions/risks identified: None   Next appointment: None    Preventive Care 77 Years and Older, Female Preventive care refers to lifestyle choices and visits with your health care provider that can promote health and wellness. What does preventive care include?  A yearly physical exam. This is also called an annual well check.  Dental exams once or twice a year.  Routine eye exams. Ask your health care provider how often you should have your eyes checked.  Personal lifestyle choices, including:  Daily care of your teeth and gums.  Regular physical activity.  Eating a healthy diet.  Avoiding tobacco and drug use.  Limiting alcohol use.  Practicing safe sex.  Taking low-dose aspirin every day.  Taking vitamin and mineral supplements as recommended by your health care provider. What happens during an annual well check? The services and screenings done by your health care provider during your annual well check will depend on your age, overall health, lifestyle risk factors, and family history of disease. Counseling  Your health care provider may ask you questions about your:  Alcohol  use.  Tobacco use.  Drug use.  Emotional well-being.  Home and relationship well-being.  Sexual activity.  Eating habits.  History of falls.  Memory and ability to understand (cognition).  Work and work Statistician.  Reproductive health. Screening  You may have the following tests or measurements:  Height, weight, and BMI.  Blood pressure.  Lipid and cholesterol levels. These may be checked every 5 years, or more frequently if you are over 40 years old.  Skin check.  Lung cancer screening. You may have this screening every year starting at age 48 if you have a 30-pack-year history of smoking and currently smoke or have quit within the past 15 years.  Fecal occult blood test (FOBT) of the stool. You may have this test every year starting at age 73.  Flexible sigmoidoscopy or colonoscopy. You may have a sigmoidoscopy every 5 years or a colonoscopy every 10 years starting at age 1.  Hepatitis C blood test.  Hepatitis B blood test.  Sexually transmitted disease (STD) testing.  Diabetes screening. This is done by checking your blood sugar (glucose) after you have not eaten for a while (fasting). You may have this done every 1-3 years.  Bone density scan. This is done to screen for osteoporosis. You may have this done starting at age 72.  Mammogram. This may be done every 1-2 years. Talk to your health care provider about how often you should have regular mammograms. Talk with your health care provider about your test results, treatment options, and if necessary, the need for more tests. Vaccines  Your health care provider may recommend certain vaccines, such as:  Influenza vaccine. This is recommended every year.  Tetanus, diphtheria, and  acellular pertussis (Tdap, Td) vaccine. You may need a Td booster every 10 years.  Zoster vaccine. You may need this after age 6.  Pneumococcal 13-valent conjugate (PCV13) vaccine. One dose is recommended after age  26.  Pneumococcal polysaccharide (PPSV23) vaccine. One dose is recommended after age 52. Talk to your health care provider about which screenings and vaccines you need and how often you need them. This information is not intended to replace advice given to you by your health care provider. Make sure you discuss any questions you have with your health care provider. Document Released: 11/28/2015 Document Revised: 07/21/2016 Document Reviewed: 09/02/2015 Elsevier Interactive Patient Education  2017 [place] Prevention in the Home Falls can cause injuries. They can happen to people of all ages. There are many things you can do to make your home safe and to help prevent falls. What can I do on the outside of my home?  Regularly fix the edges of walkways and driveways and fix any cracks.  Remove anything that might make you trip as you walk through a door, such as a raised step or threshold.  Trim any bushes or trees on the path to your home.  Use bright outdoor lighting.  Clear any walking paths of anything that might make someone trip, such as rocks or tools.  Regularly check to see if handrails are loose or broken. Make sure that both sides of any steps have handrails.  Any raised decks and porches should have guardrails on the edges.  Have any leaves, snow, or ice cleared regularly.  Use sand or salt on walking paths during winter.  Clean up any spills in your garage right away. This includes oil or grease spills. What can I do in the bathroom?  Use night lights.  Install grab bars by the toilet and in the tub and shower. Do not use towel bars as grab bars.  Use non-skid mats or decals in the tub or shower.  If you need to sit down in the shower, use a plastic, non-slip stool.  Keep the floor dry. Clean up any water that spills on the floor as soon as it happens.  Remove soap buildup in the tub or shower regularly.  Attach bath mats securely with double-sided  non-slip rug tape.  Do not have throw rugs and other things on the floor that can make you trip. What can I do in the bedroom?  Use night lights.  Make sure that you have a light by your bed that is easy to reach.  Do not use any sheets or blankets that are too big for your bed. They should not hang down onto the floor.  Have a firm chair that has side arms. You can use this for support while you get dressed.  Do not have throw rugs and other things on the floor that can make you trip. What can I do in the kitchen?  Clean up any spills right away.  Avoid walking on wet floors.  Keep items that you use a lot in easy-to-reach places.  If you need to reach something above you, use a strong step stool that has a grab bar.  Keep electrical cords out of the way.  Do not use floor polish or wax that makes floors slippery. If you must use wax, use non-skid floor wax.  Do not have throw rugs and other things on the floor that can make you trip. What can I do with my stairs?  Do not leave any items on the stairs.  Make sure that there are handrails on both sides of the stairs and use them. Fix handrails that are broken or loose. Make sure that handrails are as long as the stairways.  Check any carpeting to make sure that it is firmly attached to the stairs. Fix any carpet that is loose or worn.  Avoid having throw rugs at the top or bottom of the stairs. If you do have throw rugs, attach them to the floor with carpet tape.  Make sure that you have a light switch at the top of the stairs and the bottom of the stairs. If you do not have them, ask someone to add them for you. What else can I do to help prevent falls?  Wear shoes that:  Do not have high heels.  Have rubber bottoms.  Are comfortable and fit you well.  Are closed at the toe. Do not wear sandals.  If you use a stepladder:  Make sure that it is fully opened. Do not climb a closed stepladder.  Make sure that both  sides of the stepladder are locked into place.  Ask someone to hold it for you, if possible.  Clearly mark and make sure that you can see:  Any grab bars or handrails.  First and last steps.  Where the edge of each step is.  Use tools that help you move around (mobility aids) if they are needed. These include:  Canes.  Walkers.  Scooters.  Crutches.  Turn on the lights when you go into a dark area. Replace any light bulbs as soon as they burn out.  Set up your furniture so you have a clear path. Avoid moving your furniture around.  If any of your floors are uneven, fix them.  If there are any pets around you, be aware of where they are.  Review your medicines with your doctor. Some medicines can make you feel dizzy. This can increase your chance of falling. Ask your doctor what other things that you can do to help prevent falls. This information is not intended to replace advice given to you by your health care provider. Make sure you discuss any questions you have with your health care provider. Document Released: 08/28/2009 Document Revised: 04/08/2016 Document Reviewed: 12/06/2014 Elsevier Interactive Patient Education  2017 [employer].

## 2020-08-14 ENCOUNTER — Encounter: Payer: Self-pay | Admitting: Adult Health

## 2020-08-15 ENCOUNTER — Other Ambulatory Visit: Payer: Self-pay | Admitting: Adult Health

## 2020-08-15 DIAGNOSIS — G8929 Other chronic pain: Secondary | ICD-10-CM

## 2020-08-15 MED ORDER — CYCLOBENZAPRINE HCL 5 MG PO TABS: 5.0000 mg | ORAL_TABLET | Freq: Every evening | ORAL | 0 refills | Status: DC | PRN

## 2020-08-26 ENCOUNTER — Other Ambulatory Visit: Payer: Self-pay

## 2020-08-27 ENCOUNTER — Encounter: Payer: Self-pay | Admitting: Family Medicine

## 2020-08-27 ENCOUNTER — Ambulatory Visit (INDEPENDENT_AMBULATORY_CARE_PROVIDER_SITE_OTHER): Payer: Medicare HMO | Admitting: Family Medicine

## 2020-08-27 VITALS — BP 132/82 | HR 82 | Temp 98.0°F | Ht 61.0 in | Wt 164.0 lb

## 2020-08-27 DIAGNOSIS — M26652 Arthropathy of left temporomandibular joint: Secondary | ICD-10-CM | POA: Diagnosis not present

## 2020-08-27 NOTE — Progress Notes (Signed)
   Subjective:    [name], female    DOB: [birth year], 79 y.o.   MRN: [id number]  HPI Here for intermittent left ear pain that started about 4 months ago. It hurt last night but not today. No sinus issues or ST or fever or cough. No dizziness or hearing problems.    Review of Systems  Constitutional: Negative.   HENT: Positive for ear pain. Negative for congestion, ear discharge, postnasal drip, rhinorrhea, sinus pain and sore throat.   Eyes: Negative.   Respiratory: Negative.   Cardiovascular: Negative.        Objective:   Physical Exam Constitutional:      Appearance: Normal appearance.  HENT:     Right Ear: Tympanic membrane, ear canal and external ear normal.     Left Ear: Tympanic membrane, ear canal and external ear normal.     Nose: Nose normal.     Mouth/Throat:     Pharynx: Oropharynx is clear.     Comments: The left TMJ is very tender, no crepitus  Eyes:     Conjunctiva/sclera: Conjunctivae normal.  Cardiovascular:     Rate and Rhythm: Normal rate and regular rhythm.     Pulses: Normal pulses.     Heart sounds: Normal heart sounds.  Pulmonary:     Effort: Pulmonary effort is normal.     Breath sounds: Normal breath sounds.  Lymphadenopathy:     Cervical: No cervical adenopathy.  Neurological:     Mental Status: She is alert.           Assessment & Plan:  TMJ pain. She can try Ibuprofen as needed. Avoid taking large bites of food. She does not chew gum. I advised her to see her dentist about this.  Alysia Penna, MD

## 2020-09-11 ENCOUNTER — Encounter: Payer: Self-pay | Admitting: Adult Health

## 2020-09-12 ENCOUNTER — Telehealth: Payer: Self-pay | Admitting: Adult Health

## 2020-09-12 ENCOUNTER — Other Ambulatory Visit (INDEPENDENT_AMBULATORY_CARE_PROVIDER_SITE_OTHER): Payer: Medicare HMO

## 2020-09-12 ENCOUNTER — Other Ambulatory Visit: Payer: Self-pay | Admitting: Adult Health

## 2020-09-12 ENCOUNTER — Encounter: Payer: Self-pay | Admitting: Adult Health

## 2020-09-12 ENCOUNTER — Other Ambulatory Visit: Payer: Self-pay

## 2020-09-12 DIAGNOSIS — D509 Iron deficiency anemia, unspecified: Secondary | ICD-10-CM

## 2020-09-12 NOTE — Telephone Encounter (Signed)
Labs are entered. Needs an appointment for lab visit

## 2020-09-12 NOTE — Telephone Encounter (Signed)
Patient states Tommi Rumps wants to see her and run some labs.  Tommi Rumps is booked until Tuesday so pt wants to know if Tommi Rumps would put in lab orders for today so she can go ahead and have the results when she gets here.   Please call pt to let them know.

## 2020-09-12 NOTE — Telephone Encounter (Signed)
Made lab appointment for this afternoon.

## 2020-09-13 LAB — CBC WITH DIFFERENTIAL/PLATELET
Absolute Monocytes: 521 cells/uL (ref 200–950)
Basophils Absolute: 33 cells/uL (ref 0–200)
Basophils Relative: 0.5 %
Eosinophils Absolute: 191 cells/uL (ref 15–500)
Eosinophils Relative: 2.9 %
HCT: 45.9 % — ABNORMAL HIGH (ref 35.0–45.0)
Hemoglobin: 15.5 g/dL (ref 11.7–15.5)
Lymphs Abs: 2039 cells/uL (ref 850–3900)
MCH: 30.3 pg (ref 27.0–33.0)
MCHC: 33.8 g/dL (ref 32.0–36.0)
MCV: 89.8 fL (ref 80.0–100.0)
MPV: 10.5 fL (ref 7.5–12.5)
Monocytes Relative: 7.9 %
Neutro Abs: 3815 cells/uL (ref 1500–7800)
Neutrophils Relative %: 57.8 %
Platelets: 183 10*3/uL (ref 140–400)
RBC: 5.11 10*6/uL — ABNORMAL HIGH (ref 3.80–5.10)
RDW: 12.9 % (ref 11.0–15.0)
Total Lymphocyte: 30.9 %
WBC: 6.6 10*3/uL (ref 3.8–10.8)

## 2020-09-13 LAB — BASIC METABOLIC PANEL WITH GFR
BUN: 20 mg/dL (ref 7–25)
CO2: 27 mmol/L (ref 20–32)
Calcium: 9.8 mg/dL (ref 8.6–10.4)
Chloride: 104 mmol/L (ref 98–110)
Creat: 0.85 mg/dL (ref 0.60–0.93)
GFR, Est African American: 77 mL/min/{1.73_m2} (ref >=60)
GFR, Est Non African American: 66 mL/min/{1.73_m2} (ref >=60)
Glucose, Bld: 105 mg/dL — ABNORMAL HIGH (ref 65–99)
Potassium: 4.8 mmol/L (ref 3.5–5.3)
Sodium: 141 mmol/L (ref 135–146)

## 2020-09-13 LAB — IRON,TIBC AND FERRITIN PANEL
%SAT: 26 % (calc) (ref 16–45)
Ferritin: 111 ng/mL (ref 16–288)
Iron: 96 ug/dL (ref 45–160)
TIBC: 363 mcg/dL (calc) (ref 250–450)

## 2020-09-15 NOTE — Telephone Encounter (Signed)
Noted  

## 2020-09-16 ENCOUNTER — Ambulatory Visit (INDEPENDENT_AMBULATORY_CARE_PROVIDER_SITE_OTHER): Payer: Medicare HMO | Admitting: Adult Health

## 2020-09-16 ENCOUNTER — Encounter: Payer: Self-pay | Admitting: Adult Health

## 2020-09-16 ENCOUNTER — Other Ambulatory Visit: Payer: Self-pay

## 2020-09-16 VITALS — BP 112/80 | HR 79 | Temp 98.0°F | Ht 61.0 in | Wt 165.6 lb

## 2020-09-16 DIAGNOSIS — R195 Other fecal abnormalities: Secondary | ICD-10-CM

## 2020-09-16 DIAGNOSIS — R5383 Other fatigue: Secondary | ICD-10-CM | POA: Diagnosis not present

## 2020-09-16 DIAGNOSIS — F3342 Major depressive disorder, recurrent, in full remission: Secondary | ICD-10-CM | POA: Diagnosis not present

## 2020-09-16 NOTE — Progress Notes (Signed)
Subjective:    [name], female    DOB: 10-17-1941, 79 y.o.   MRN: [id number]  HPI 79 year old female who  has a past medical history of Adenomatous colon polyp, Anxiety, Arthritis, Breast cancer ([place]) (08/08/13), Cataract, Chronic insomnia, Cluster headaches, Depression, Diverticulosis, Fatty liver (2011), Fibromyalgia, GERD (gastroesophageal reflux disease), H/O hiatal hernia, History of transfusion (08/30/2013), radiation therapy (10/29/13- 12/14/13), Hypothyroidism, Internal hemorrhoids, Irritable bowel syndrome, Kidney stone, Lymphedema, Macular degeneration, MDD (major depressive disorder), Osteopenia, Other abnormal glucose, Other and unspecified hyperlipidemia, Pain in joint, shoulder region, Pneumonia (8466,5993), Sleep apnea, and Stress incontinence, female.   She presents to the office today for fatigued,  Feeling lethargic, sleeping a lot, and still having blood in her stool.  She sent a MyChart message last week and I had them come in for blood work since I had no openings.  CBC, iron panel, and BMP were all normal.  She does have a history of gastric ulcer and has been dealing with dark stool over the last few months.  Last week she did not get out of bed much and slept throughout the day.  She has been pretty sedentary at home watching a lot of TV and not exercising due to osteoarthritic pain.  Today she reports that was the first day that she did not have blood in her stool.  She feels as though her fatigue and lethargy have started to improve.  She denies shortness of breath or chest pain.   Review of Systems See HPI   Past Medical History:  Diagnosis Date   Adenomatous colon polyp    Anxiety    PHOBIAS   Arthritis    Breast cancer ([place]) 08/08/13   right LOQ   Cataract    Chronic insomnia    Cluster headaches    history of migraines / NONE FOR SEVERAL YRS   Depression    Diverticulosis    Fatty liver 2011   Fibromyalgia    GERD  (gastroesophageal reflux disease)    H/O hiatal hernia    History of transfusion 08/30/2013   Hx of radiation therapy 10/29/13- 12/14/13   right chest wall 5040 cGy 28 sessions, right supraclavicular/axillary region 5040 cGy 28 sessions, right chest wall boost 1000 cGy 5 sessions   Hypothyroidism    Internal hemorrhoids    Irritable bowel syndrome    Kidney stone    Lymphedema    RT ARM - WEARS SLEEVE   Macular degeneration    hole/right eye   MDD (major depressive disorder)    Osteopenia    Other abnormal glucose    Other and unspecified hyperlipidemia    Pain in joint, shoulder region    Pneumonia 5701,7793   Sleep apnea    USES C-PAP   Stress incontinence, female     Social History   Socioeconomic History   Marital status: Married    Spouse name: Not on file   Number of children: 3   Years of education: Not on file   Highest education level: Not on file  Occupational History   Occupation: retired bookkeeper  Tobacco Use   Smoking status: Former Smoker    Packs/day: 0.10    Years: 2.00    Pack years: 0.20    Types: Cigarettes    Start date: 11/16/1959    Quit date: 11/15/1960    Years since quitting: 59.8   Smokeless tobacco: Never Used  Vaping Use   Vaping Use: Never used  Substance and Sexual Activity   Alcohol use: Yes    Comment: occ   Drug use: No   Sexual activity: Not on file    Comment: menarche age 14, fist live birth 65, P 3, hysterectomy age 70, no HRT, BCP 2 yrs  Other Topics Concern   Not on file  Social History Narrative   Occupation:  Retired Radiation protection practitioner    Married with 3 grown children      Never Smoked     Alcohol use-no        Social Determinants of Radio broadcast assistant Strain: Low Risk    Difficulty of Paying Living Expenses: Not hard at all  Food Insecurity:    Worried About Charity fundraiser in the Last Year: Not on Water quality scientist in the Last Year: Not on file  Transportation Needs: No  Transportation Needs   Lack of Transportation (Medical): No   Lack of Transportation (Non-Medical): No  Physical Activity: Sufficiently Active   Days of Exercise per Week: 7 days   Minutes of Exercise per Session: 60 min  Stress: No Stress Concern Present   Feeling of Stress : Not at all  Social Connections: Moderately Integrated   Frequency of Communication with Friends and Family: More than three times a week   Frequency of Social Gatherings with Friends and Family: Twice a week   Attends Religious Services: More than 4 times per year   Active Member of [employer] or Organizations: No   Attends Archivist Meetings: Never   Marital Status: Married  Human resources officer Violence: Not At Risk   Fear of Current or Ex-Partner: No   Emotionally Abused: No   Physically Abused: No   Sexually Abused: No    Past Surgical History:  Procedure Laterality Date   ABDOMINAL HYSTERECTOMY     APPENDECTOMY     BILATERAL TOTAL MASTECTOMY WITH AXILLARY LYMPH NODE DISSECTION  08/30/2013   Dr Barry Dienes   BREAST CYST ASPIRATION     9 cysts   CATARACT EXTRACTION, BILATERAL  2005/2007   CHOLECYSTECTOMY     COLONOSCOPY     CYSTOCELE REPAIR     EVACUATION BREAST HEMATOMA Left 08/31/2013   Procedure: EVACUATION HEMATOMA BREAST;  Surgeon: Stark Klein, MD;  Location: [facility];  Service: General;  Laterality: Left;   EYE SURGERY     to repair macular hole   FOOT ARTHROPLASTY     lt    GANGLION CYST EXCISION     rt foot   HEMORRHOID SURGERY     03/1993   JOINT REPLACEMENT  03/15/11   left knee replacement   KNEE ARTHROSCOPY     /partial knee 2016/left knee 2012   MASS EXCISION  11/04/2011   Procedure: EXCISION MASS;  Surgeon: Cammie Sickle., MD;  Location: [facility];  Service: Orthopedics;  Laterality: Right;  excisional biopsy right ulna mass   MASTECTOMY W/ SENTINEL NODE BIOPSY Right 08/30/2013   Procedure: RIGHT  AXILLARY SENTINEL LYMPH NODE  BIOPSY; Right Axillary Node Disection;  Surgeon: Stark Klein, MD;  Location: [facility];  Service: General;  Laterality: Right;  Right side nuc med 7:00    PARTIAL KNEE ARTHROPLASTY Right 11/03/2015   Procedure: RIGHT KNEE MEDIAL UNICOMPARTMENTAL ARTHROPLASTY ;  Surgeon: Gaynelle Arabian, MD;  Location: [facility];  Service: Orthopedics;  Laterality: Right;   RECTOCELE REPAIR     SIMPLE MASTECTOMY WITH AXILLARY SENTINEL NODE BIOPSY Left 08/30/2013  Procedure: Bilateral Breast Mastectomy ;  Surgeon: Stark Klein, MD;  Location: [facility];  Service: General;  Laterality: Left;   skin tags removed     breast, panty line, neckline   TOE SURGERY     preventative crossover toe surg/right foot   TOE SURGERY  2009   left foot/screw  in 2nd toe   TONSILLECTOMY     UPPER GASTROINTESTINAL ENDOSCOPY      Family History  Problem Relation Age of Onset   Stroke Mother        died age 23   Diabetes Mother    Breast cancer Mother 49   Breast cancer Sister 74   Breast cancer Paternal Aunt 19   Diabetes Maternal Grandfather    Breast cancer Paternal Grandmother 28   Breast cancer Paternal Aunt        dx in her 16s   Cancer Maternal Grandmother        intra-abdominal cancer   Brain cancer Maternal Uncle 8   Brain cancer Cousin 100       maternal cousin   Brain cancer Cousin 32       paternal cousin   Colon cancer Neg Hx     No Known Allergies  Current Outpatient Medications on File Prior to Visit  Medication Sig Dispense Refill   butalbital-acetaminophen-caffeine (FIORICET) 50-325-40 MG tablet Take 1 tablet by mouth as needed for headache.     Calcium Carbonate-Vitamin D (CALCIUM-VITAMIN D3 PO) Take 1 tablet by mouth in the morning and at bedtime.     Cholecalciferol (VITAMIN D3) 50 MCG (2000 UT) TABS Take 1 tablet by mouth daily.      cyclobenzaprine (FLEXERIL) 5 MG tablet Take 1 tablet (5 mg total) by mouth at bedtime as needed for muscle spasms. 30 tablet 0   diazepam (VALIUM)  5 MG tablet Take 5 mg by mouth every 6 (six) hours as needed for anxiety. As needed      esomeprazole (NEXIUM) 40 MG capsule Take 1 capsule by mouth  daily (Patient taking differently: Take 40 mg by mouth daily before breakfast. Take 1 capsule by mouth  daily) 30 capsule 6   famotidine (PEPCID) 40 MG tablet Take 1 tablet (40 mg total) by mouth at bedtime. 30 tablet 6   levothyroxine (SYNTHROID) 100 MCG tablet Take 1 tablet (100 mcg total) by mouth daily. 365 tablet 0   Lutein-Zeaxanthin 25-5 MG CAPS Take 1 capsule by mouth daily.     MELATONIN PO Take 1 tablet by mouth daily.     omega-3 acid ethyl esters (LOVAZA) 1 g capsule Take 1 capsule (1 g total) by mouth 2 (two) times daily. 180 capsule 3   simvastatin (ZOCOR) 10 MG tablet Take 1 tablet (10 mg total) by mouth at bedtime. 365 tablet 0   topiramate (TOPAMAX) 25 MG tablet Take 1 tablet (25 mg total) by mouth daily. 325 tablet 0   traMADol (ULTRAM) 50 MG tablet Take 50 mg by mouth every 6 (six) hours as needed.     venlafaxine XR (EFFEXOR-XR) 37.5 MG 24 hr capsule Take 37.5 mg by mouth daily with breakfast.     No current facility-administered medications on file prior to visit.    BP 112/80 (BP Location: Left Arm, Patient Position: Sitting, Cuff Size: Large)    Pulse 79    Temp 98 F (36.7 C) (Oral)    Ht 5\' 1"  (1.549 m)    Wt 165 lb 9.6 oz (75.1 kg)  SpO2 97%    BMI 31.29 kg/m       Objective:   Physical Exam Vitals and nursing note reviewed.  Constitutional:      Appearance: Normal appearance.  Cardiovascular:     Rate and Rhythm: Regular rhythm.     Heart sounds: Normal heart sounds.  Musculoskeletal:        General: Normal range of motion.  Skin:    General: Skin is warm and dry.  Neurological:     General: No focal deficit present.     Mental Status: She is alert and oriented to person, place, and time.  Psychiatric:        Mood and Affect: Mood normal.        Behavior: Behavior normal.        Thought  Content: Thought content normal.        Judgment: Judgment normal.        Assessment & Plan:  1. MDD (major depressive disorder), recurrent, in full remission ([place]) -Looking back through her chart she seems to go and ebbs and flows.  In the past she has been on Effexor and has felt really well on this medication has a lot more energy, once she is feeling better she will come off the medication and she has some degree of depression and loss of energy.  I think a lot of what she has going on now with some degree of depression, despite denying any symptoms.  I am going to  have her restart her Effexor 37-1/2 mg in see how she feels over the next few weeks.  She has medication left at home and she will restart this and let me know when she is about out  2. Dark stools - If she continues to have them then she was advised to follow up with GI   3. Fatigue, unspecified type - labs normal. I have a feeling her symptoms are likely from a degree of depression   [name], NP

## 2020-09-19 ENCOUNTER — Encounter: Payer: Self-pay | Admitting: Adult Health

## 2020-09-19 ENCOUNTER — Other Ambulatory Visit: Payer: Self-pay | Admitting: Adult Health

## 2020-09-19 MED ORDER — BUTALBITAL-APAP-CAFFEINE 50-325-40 MG PO TABS: 1.0000 | ORAL_TABLET | ORAL | 0 refills | Status: DC | PRN

## 2020-10-01 ENCOUNTER — Ambulatory Visit (INDEPENDENT_AMBULATORY_CARE_PROVIDER_SITE_OTHER): Payer: Medicare HMO | Admitting: *Deleted

## 2020-10-01 ENCOUNTER — Other Ambulatory Visit: Payer: Self-pay

## 2020-10-01 DIAGNOSIS — Z23 Encounter for immunization: Secondary | ICD-10-CM

## 2020-10-22 ENCOUNTER — Other Ambulatory Visit: Payer: Self-pay

## 2020-10-23 ENCOUNTER — Ambulatory Visit: Payer: Medicare HMO | Admitting: Family Medicine

## 2020-10-26 ENCOUNTER — Other Ambulatory Visit: Payer: Self-pay | Admitting: Physician Assistant

## 2020-11-24 ENCOUNTER — Encounter: Payer: Self-pay | Admitting: Adult Health

## 2020-11-25 ENCOUNTER — Other Ambulatory Visit: Payer: Self-pay | Admitting: Adult Health

## 2020-11-25 MED ORDER — VENLAFAXINE HCL ER 37.5 MG PO CP24: 37.5000 mg | ORAL_CAPSULE | Freq: Every day | ORAL | 1 refills | Status: DC

## 2020-11-27 ENCOUNTER — Ambulatory Visit (INDEPENDENT_AMBULATORY_CARE_PROVIDER_SITE_OTHER): Payer: Medicare HMO | Admitting: Adult Health

## 2020-11-27 ENCOUNTER — Encounter: Payer: Self-pay | Admitting: Adult Health

## 2020-11-27 ENCOUNTER — Other Ambulatory Visit: Payer: Self-pay

## 2020-11-27 ENCOUNTER — Ambulatory Visit (INDEPENDENT_AMBULATORY_CARE_PROVIDER_SITE_OTHER): Payer: Medicare HMO

## 2020-11-27 VITALS — BP 132/84 | Temp 98.0°F | Wt 170.0 lb

## 2020-11-27 DIAGNOSIS — M545 Low back pain, unspecified: Secondary | ICD-10-CM | POA: Diagnosis not present

## 2020-11-27 NOTE — Patient Instructions (Addendum)
I am going to get an xray of your lower back but I think this is more of a muscle issue. I am going to refer you to PT for dry needling   It would also be beneficial to try acupuncture   [facility] [address] [place], [place] [address] [phone]

## 2020-11-27 NOTE — Progress Notes (Signed)
Subjective:    [name], female    DOB: Jul 22, 1941, 80 y.o.   MRN: [id number]  HPI 80 year old female who  has a past medical history of Adenomatous colon polyp, Anxiety, Arthritis, Breast cancer ([place]) (08/08/13), Cataract, Chronic insomnia, Cluster headaches, Depression, Diverticulosis, Fatty liver (2011), Fibromyalgia, GERD (gastroesophageal reflux disease), H/O hiatal hernia, History of transfusion (08/30/2013), radiation therapy (10/29/13- 12/14/13), Hypothyroidism, Internal hemorrhoids, Irritable bowel syndrome, Kidney stone, Lymphedema, Macular degeneration, MDD (major depressive disorder), Osteopenia, Other abnormal glucose, Other and unspecified hyperlipidemia, Pain in joint, shoulder region, Pneumonia [id number]), Sleep apnea, and Stress incontinence, female.  She presents to the office today for chronic issue of low back pain.  She has had back pain on and off for many years.  Was hopeful that after receiving knee injections that the back pain would also improve.  She does report that the knee injections worked well for her arthritic pain but the back has been getting progressively worse.  Pain is worse when she moves from a sitting to a standing position after she has sat for a while.  She has trouble standing upright and often has to bend at the waist while walking to alleviate the pain.  Her husband gave her a massage yesterday which she found to be helpful and relieved the pain somewhat but not completely.   Review of Systems See HPI   Past Medical History:  Diagnosis Date  . Adenomatous colon polyp   . Anxiety    PHOBIAS  . Arthritis   . Breast cancer ([place]) 08/08/13   right LOQ  . Cataract   . Chronic insomnia   . Cluster headaches    history of migraines / NONE FOR SEVERAL YRS  . Depression   . Diverticulosis   . Fatty liver 2011  . Fibromyalgia   . GERD (gastroesophageal reflux disease)   . H/O hiatal hernia   . History of transfusion 08/30/2013  . Hx  of radiation therapy 10/29/13- 12/14/13   right chest wall 5040 cGy 28 sessions, right supraclavicular/axillary region 5040 cGy 28 sessions, right chest wall boost 1000 cGy 5 sessions  . Hypothyroidism   . Internal hemorrhoids   . Irritable bowel syndrome   . Kidney stone   . Lymphedema    RT ARM - WEARS SLEEVE  . Macular degeneration    hole/right eye  . MDD (major depressive disorder)   . Osteopenia   . Other abnormal glucose   . Other and unspecified hyperlipidemia   . Pain in joint, shoulder region   . Pneumonia [id number]  . Sleep apnea    USES C-PAP  . Stress incontinence, female     Social History   Socioeconomic History  . Marital status: Married    Spouse name: Not on file  . Number of children: 3  . Years of education: Not on file  . Highest education level: Not on file  Occupational History  . Occupation: retired bookkeeper  Tobacco Use  . Smoking status: Former Smoker    Packs/day: 0.10    Years: 2.00    Pack years: 0.20    Types: Cigarettes    Start date: 11/16/1959    Quit date: 11/15/1960    Years since quitting: 60.0  . Smokeless tobacco: Never Used  Vaping Use  . Vaping Use: Never used  Substance and Sexual Activity  . Alcohol use: Yes    Comment: occ  . Drug use: No  . Sexual activity: Not on  file    Comment: menarche age 75, fist live birth 58, P 3, hysterectomy age 28, no HRT, BCP 2 yrs  Other Topics Concern  . Not on file  Social History Narrative   Occupation:  Retired Radiation protection practitioner    Married with 3 grown children      Never Smoked     Alcohol use-no        Social Determinants of Radio broadcast assistant Strain: Low Risk   . Difficulty of Paying Living Expenses: Not hard at all  Food Insecurity: Not on file  Transportation Needs: No Transportation Needs  . Lack of Transportation (Medical): No  . Lack of Transportation (Non-Medical): No  Physical Activity: Sufficiently Active  . Days of Exercise per Week: 7 days  . Minutes of Exercise  per Session: 60 min  Stress: No Stress Concern Present  . Feeling of Stress : Not at all  Social Connections: Moderately Integrated  . Frequency of Communication with Friends and Family: More than three times a week  . Frequency of Social Gatherings with Friends and Family: Twice a week  . Attends Religious Services: More than 4 times per year  . Active Member of Clubs or Organizations: No  . Attends Archivist Meetings: Never  . Marital Status: Married  Human resources officer Violence: Not At Risk  . Fear of Current or Ex-Partner: No  . Emotionally Abused: No  . Physically Abused: No  . Sexually Abused: No    Past Surgical History:  Procedure Laterality Date  . ABDOMINAL HYSTERECTOMY    . APPENDECTOMY    . BILATERAL TOTAL MASTECTOMY WITH AXILLARY LYMPH NODE DISSECTION  08/30/2013   Dr Barry Dienes  . BREAST CYST ASPIRATION     9 cysts  . CATARACT EXTRACTION, BILATERAL  2005/2007  . CHOLECYSTECTOMY    . COLONOSCOPY    . CYSTOCELE REPAIR    . EVACUATION BREAST HEMATOMA Left 08/31/2013   Procedure: EVACUATION HEMATOMA BREAST;  Surgeon: Stark Klein, MD;  Location: [facility];  Service: General;  Laterality: Left;  . EYE SURGERY     to repair macular hole  . FOOT ARTHROPLASTY     lt   . GANGLION CYST EXCISION     rt foot  . HEMORRHOID SURGERY     03/1993  . JOINT REPLACEMENT  03/15/11   left knee replacement  . KNEE ARTHROSCOPY     /partial knee 2016/left knee 2012  . MASS EXCISION  11/04/2011   Procedure: EXCISION MASS;  Surgeon: Cammie Sickle., MD;  Location: [facility];  Service: Orthopedics;  Laterality: Right;  excisional biopsy right ulna mass  . MASTECTOMY W/ SENTINEL NODE BIOPSY Right 08/30/2013   Procedure: RIGHT  AXILLARY SENTINEL LYMPH NODE BIOPSY; Right Axillary Node Disection;  Surgeon: Stark Klein, MD;  Location: [facility];  Service: General;  Laterality: Right;  Right side nuc med 7:00   . PARTIAL KNEE ARTHROPLASTY Right 11/03/2015   Procedure:  RIGHT KNEE MEDIAL UNICOMPARTMENTAL ARTHROPLASTY ;  Surgeon: Gaynelle Arabian, MD;  Location: [facility];  Service: Orthopedics;  Laterality: Right;  . RECTOCELE REPAIR    . SIMPLE MASTECTOMY WITH AXILLARY SENTINEL NODE BIOPSY Left 08/30/2013   Procedure: Bilateral Breast Mastectomy ;  Surgeon: Stark Klein, MD;  Location: [facility];  Service: General;  Laterality: Left;  . skin tags removed     breast, panty line, neckline  . TOE SURGERY     preventative crossover toe surg/right foot  . TOE SURGERY  2009   left foot/screw  in 2nd toe  . TONSILLECTOMY    . UPPER GASTROINTESTINAL ENDOSCOPY      Family History  Problem Relation Age of Onset  . Stroke Mother        died age 13  . Diabetes Mother   . Breast cancer Mother 77  . Breast cancer Sister 86  . Breast cancer Paternal Aunt 25  . Diabetes Maternal Grandfather   . Breast cancer Paternal Grandmother 23  . Breast cancer Paternal Aunt        dx in her 68s  . Cancer Maternal Grandmother        intra-abdominal cancer  . Brain cancer Maternal Uncle 8  . Brain cancer Cousin 1       maternal cousin  . Brain cancer Cousin 20       paternal cousin  . Colon cancer Neg Hx     No Known Allergies  Current Outpatient Medications on File Prior to Visit  Medication Sig Dispense Refill  . butalbital-acetaminophen-caffeine (FIORICET) 50-325-40 MG tablet Take 1 tablet by mouth as needed for headache. 14 tablet 0  . Calcium Carbonate-Vitamin D (CALCIUM-VITAMIN D3 PO) Take 1 tablet by mouth in the morning and at bedtime.    . Cholecalciferol (VITAMIN D3) 50 MCG (2000 UT) TABS Take 1 tablet by mouth daily.     . cyclobenzaprine (FLEXERIL) 5 MG tablet Take 1 tablet (5 mg total) by mouth at bedtime as needed for muscle spasms. 30 tablet 0  . diazepam (VALIUM) 5 MG tablet Take 5 mg by mouth every 6 (six) hours as needed for anxiety. As needed     . esomeprazole (NEXIUM) 40 MG capsule Take 1 capsule by mouth  daily (Patient taking differently: Take 40 mg by  mouth daily before breakfast. Take 1 capsule by mouth  daily) 30 capsule 6  . famotidine (PEPCID) 40 MG tablet TAKE ONE TABLET BY MOUTH AT BEDTIME 90 tablet 1  . levothyroxine (SYNTHROID) 100 MCG tablet Take 1 tablet (100 mcg total) by mouth daily. 365 tablet 0  . Lutein-Zeaxanthin 25-5 MG CAPS Take 1 capsule by mouth daily.    Marland Kitchen MELATONIN PO Take 1 tablet by mouth daily.    . simvastatin (ZOCOR) 10 MG tablet Take 1 tablet (10 mg total) by mouth at bedtime. 365 tablet 0  . topiramate (TOPAMAX) 25 MG tablet Take 1 tablet (25 mg total) by mouth daily. 325 tablet 0  . traMADol (ULTRAM) 50 MG tablet Take 50 mg by mouth every 6 (six) hours as needed.    . venlafaxine XR (EFFEXOR-XR) 37.5 MG 24 hr capsule Take 1 capsule (37.5 mg total) by mouth daily with breakfast. 90 capsule 1  . omega-3 acid ethyl esters (LOVAZA) 1 g capsule Take 1 capsule (1 g total) by mouth 2 (two) times daily. 180 capsule 3   No current facility-administered medications on file prior to visit.    BP 132/84   Temp 98 F (36.7 C)   Wt 170 lb (77.1 kg)   BMI 32.12 kg/m       Objective:   Physical Exam Vitals reviewed.  Constitutional:      Appearance: Normal appearance.  Musculoskeletal:        General: Tenderness present.     Lumbar back: Tenderness present. No swelling or bony tenderness. Decreased range of motion.       Back:     Comments: He does have tenderness with deep palpation to the lower  lumbar paraspinal muscles.  There was no bony tenderness or step-offs noted to the lumbar spine.  Skin:    General: Skin is warm and dry.     Capillary Refill: Capillary refill takes less than 2 seconds.  Neurological:     General: No focal deficit present.     Mental Status: She is alert and oriented to person, place, and time.  Psychiatric:        Mood and Affect: Mood normal.        Behavior: Behavior normal.        Thought Content: Thought content normal.        Judgment: Judgment normal.       Assessment  & Plan:  1. Lumbar spine pain -Seems to be more muscular than skeletal.  Will check lumbar spine x-ray.  Refer her to physical therapy for possible dry needling.  Believe acupuncture might also be beneficial for her and she and her husband will look into this.  Use warm compress or heating pads when sitting.  Try gentle stretching exercises. - DG Lumbar Spine Complete; Future - Ambulatory referral to Physical Therapy  [name], NP

## 2020-12-01 ENCOUNTER — Encounter: Payer: Self-pay | Admitting: Adult Health

## 2020-12-09 ENCOUNTER — Other Ambulatory Visit: Payer: Self-pay | Admitting: Adult Health

## 2020-12-09 DIAGNOSIS — M545 Low back pain, unspecified: Secondary | ICD-10-CM

## 2020-12-15 ENCOUNTER — Encounter: Payer: Self-pay | Admitting: Physical Therapy

## 2020-12-15 ENCOUNTER — Other Ambulatory Visit: Payer: Self-pay

## 2020-12-15 ENCOUNTER — Ambulatory Visit (INDEPENDENT_AMBULATORY_CARE_PROVIDER_SITE_OTHER): Payer: Medicare HMO | Admitting: Physical Therapy

## 2020-12-15 DIAGNOSIS — M545 Low back pain, unspecified: Secondary | ICD-10-CM

## 2020-12-15 DIAGNOSIS — G8929 Other chronic pain: Secondary | ICD-10-CM | POA: Diagnosis not present

## 2020-12-15 NOTE — Patient Instructions (Signed)
Access Code: [email or web address] URL: [email or web address] Date: 12/15/2020 Prepared by: Lyndee Hensen  Exercises Supine Single Knee to Chest Stretch - 2 x daily - 3 reps - 30 hold Supine Posterior Pelvic Tilt - 2 x daily - 2 sets - 10 reps - 3 hold Seated Hamstring Stretch - 2 x daily - 3 reps - 30 hold

## 2020-12-17 NOTE — Progress Notes (Signed)
Office Visit Note  Patient: Jean Nash             Date of Birth: Dec 15, 1940           MRN: [id number]             PCP: [name], NP Referring: [name], NP Visit Date: 12/31/2020 Occupation: @GUAROCC @  Subjective:  Lower back pain   History of Present Illness: Jean Nash is a 80 y.o. female with history of fibromyalgia and osteoarthritis. She underwent ultrasound guided bilateral CMC joint injections on 07/02/20, which resolved her discomfort.  She has been able to knit on a regular basis without difficulty.  She has occasional stiffness in her hands but no tenderness or joint swelling.  She states recently had visco gel injections in her right knee which is partially replaced.  She states that these injections have eased her discomfort. She states her left total knee replacement is doing well.  She continues to have chronic pain and stiffness in her lower back. She recently started physical therapy and has been going twice weekly.  She has also started using a cubii exercise bike at home, which has been improving her generalized stiffness.  She denies any recent fibromyalgia flares.     Activities of Daily Living:  Patient reports morning stiffness for all day.   Patient Denies nocturnal pain.   Difficulty dressing/grooming: Denies Difficulty climbing stairs: Reports Difficulty getting out of chair: Denies Difficulty using hands for taps, buttons, cutlery, and/or writing: Reports  Review of Systems  Constitutional: Positive for fatigue.  HENT: Negative for mouth sores, mouth dryness and nose dryness.   Eyes: Negative for pain, itching, visual disturbance and dryness.  Respiratory: Negative for cough, hemoptysis, shortness of breath and difficulty breathing.   Cardiovascular: Negative for chest pain, palpitations, hypertension and swelling in legs/feet.  Gastrointestinal: Positive for blood in stool. Negative for abdominal pain, constipation and diarrhea.   Endocrine: Negative for increased urination.  Genitourinary: Negative for difficulty urinating and painful urination.  Musculoskeletal: Positive for arthralgias, joint pain and morning stiffness. Negative for joint swelling, myalgias, muscle weakness, muscle tenderness and myalgias.  Skin: Negative for color change, pallor, rash, hair loss, nodules/bumps, redness, skin tightness, ulcers and sensitivity to sunlight.  Allergic/Immunologic: Positive for susceptible to infections.  Neurological: Positive for headaches. Negative for dizziness, numbness, memory loss and weakness.  Hematological: Negative for bruising/bleeding tendency and swollen glands.  Psychiatric/Behavioral: Negative for depressed mood, confusion and sleep disturbance. The patient is not nervous/anxious.     PMFS History:  Patient Active Problem List   Diagnosis Date Noted  . Genetic testing 08/05/2020  . Fibromyalgia   . MDD (major depressive disorder), recurrent, in full remission ([place]) 12/10/2019  . Pulmonary fibrosis ([place]) 12/04/2018  . MDD (major depressive disorder), recurrent episode, mild ([place]) 12/22/2017  . Osteoporosis 07/14/2017  . OA (osteoarthritis) of knee 11/03/2015  . Osteopenia 08/08/2015  . Headache 02/03/2015  . Atypical chest pain 07/15/2014  . Arthralgia 02/22/2014  . Psoriasis 02/22/2014  . Malignant neoplasm of lower-outer quadrant of right breast of female, estrogen receptor positive ([place]) 08/09/2013  . Neck pain 06/22/2013  . Left knee pain 11/29/2012  . Reactive depression (situational) 04/26/2012  . Right wrist pain 02/02/2012  . Right foot pain 10/04/2011  . Nevus 06/05/2011  . Status post total knee replacement 04/06/2011  . Overactive bladder 03/14/2011  . KNEE PAIN, LEFT 08/31/2010  . HEARING LOSS 08/17/2010  . HIRSUTISM 06/16/2009  . CYST, IDIOPATHIC 05/20/2008  .  IRRITABLE BOWEL SYNDROME 04/15/2008  . Allergic asthma, mild intermittent, uncomplicated [id number]  . GERD  03/19/2008  . COLONIC POLYPS, HX OF 03/19/2008  . NEPHROLITHIASIS, HX OF 03/19/2008  . Hypothyroidism 03/18/2008  . Hyperlipidemia 03/18/2008  . INSOMNIA, CHRONIC 03/18/2008  . FIBROMYALGIA 03/18/2008  . Prediabetes 03/18/2008    Past Medical History:  Diagnosis Date  . Adenomatous colon polyp   . Anxiety    PHOBIAS  . Arthritis   . Breast cancer ([place]) 08/08/13   right LOQ  . Cataract   . Chronic insomnia   . Cluster headaches    history of migraines / NONE FOR SEVERAL YRS  . Depression   . Diverticulosis   . Fatty liver 2011  . Fibromyalgia   . GERD (gastroesophageal reflux disease)   . H/O hiatal hernia   . History of transfusion 08/30/2013  . Hx of radiation therapy 10/29/13- 12/14/13   right chest wall 5040 cGy 28 sessions, right supraclavicular/axillary region 5040 cGy 28 sessions, right chest wall boost 1000 cGy 5 sessions  . Hypothyroidism   . Internal hemorrhoids   . Irritable bowel syndrome   . Kidney stone   . Lymphedema    RT ARM - WEARS SLEEVE  . Macular degeneration    hole/right eye  . MDD (major depressive disorder)   . Osteopenia   . Other abnormal glucose   . Other and unspecified hyperlipidemia   . Pain in joint, shoulder region   . Pneumonia [id number]  . Sleep apnea    USES C-PAP  . Stress incontinence, female     Family History  Problem Relation Age of Onset  . Stroke Mother        died age 56  . Diabetes Mother   . Breast cancer Mother 41  . Breast cancer Sister 43  . Breast cancer Paternal Aunt 75  . Diabetes Maternal Grandfather   . Breast cancer Paternal Grandmother 36  . Breast cancer Paternal Aunt        dx in her 22s  . Cancer Maternal Grandmother        intra-abdominal cancer  . Brain cancer Maternal Uncle 8  . Brain cancer Cousin 40       maternal cousin  . Brain cancer Cousin 20       paternal cousin  . Colon cancer Neg Hx    Past Surgical History:  Procedure Laterality Date  . ABDOMINAL HYSTERECTOMY    . APPENDECTOMY     . BILATERAL TOTAL MASTECTOMY WITH AXILLARY LYMPH NODE DISSECTION  08/30/2013   Dr Barry Dienes  . BREAST CYST ASPIRATION     9 cysts  . CATARACT EXTRACTION, BILATERAL  2005/2007  . CHOLECYSTECTOMY    . COLONOSCOPY    . CYSTOCELE REPAIR    . EVACUATION BREAST HEMATOMA Left 08/31/2013   Procedure: EVACUATION HEMATOMA BREAST;  Surgeon: Stark Klein, MD;  Location: [facility];  Service: General;  Laterality: Left;  . EYE SURGERY     to repair macular hole  . FOOT ARTHROPLASTY     lt   . GANGLION CYST EXCISION     rt foot  . HEMORRHOID SURGERY     03/1993  . JOINT REPLACEMENT  03/15/11   left knee replacement  . KNEE ARTHROSCOPY     /partial knee 2016/left knee 2012  . MASS EXCISION  11/04/2011   Procedure: EXCISION MASS;  Surgeon: Cammie Sickle., MD;  Location: [facility];  Service: Orthopedics;  Laterality: Right;  excisional biopsy right ulna mass  . MASTECTOMY W/ SENTINEL NODE BIOPSY Right 08/30/2013   Procedure: RIGHT  AXILLARY SENTINEL LYMPH NODE BIOPSY; Right Axillary Node Disection;  Surgeon: Stark Klein, MD;  Location: [facility];  Service: General;  Laterality: Right;  Right side nuc med 7:00   . PARTIAL KNEE ARTHROPLASTY Right 11/03/2015   Procedure: RIGHT KNEE MEDIAL UNICOMPARTMENTAL ARTHROPLASTY ;  Surgeon: Gaynelle Arabian, MD;  Location: [facility];  Service: Orthopedics;  Laterality: Right;  . RECTOCELE REPAIR    . SIMPLE MASTECTOMY WITH AXILLARY SENTINEL NODE BIOPSY Left 08/30/2013   Procedure: Bilateral Breast Mastectomy ;  Surgeon: Stark Klein, MD;  Location: [facility];  Service: General;  Laterality: Left;  . skin tags removed     breast, panty line, neckline  . TOE SURGERY     preventative crossover toe surg/right foot  . TOE SURGERY  2009   left foot/screw  in 2nd toe  . TONSILLECTOMY    . UPPER GASTROINTESTINAL ENDOSCOPY     Social History   Social History Narrative   Occupation:  Retired Radiation protection practitioner    Married with 3 grown children      Never Smoked      Alcohol use-no        Immunization History  Administered Date(s) Administered  . Fluad Quad(high Dose 65+) 07/13/2019, 10/01/2020  . Influenza Split 07/27/2012  . Influenza Whole 09/13/2007, 08/08/2008, 07/30/2009, 08/25/2010  . Influenza, High Dose Seasonal PF 08/03/2014, 08/08/2015, 08/23/2016, 07/14/2017, 07/20/2018  . Influenza,inj,Quad PF,6+ Mos 07/25/2013  . PFIZER(Purple Top)SARS-COV-2 Vaccination 12/23/2019, 01/17/2020, 08/18/2020  . Pneumococcal Conjugate-13 08/16/2013  . Pneumococcal Polysaccharide-23 06/18/2008  . Tdap 11/29/2012  . Zoster 08/05/2011  . Zoster Recombinat (Shingrix) 04/11/2018, 09/22/2018     Objective: Vital Signs: BP 135/83 (BP Location: Left Arm, Patient Position: Sitting, Cuff Size: Normal)   Pulse 64   Resp 16   Ht 5' (1.524 m)   Wt 175 lb 12.8 oz (79.7 kg)   BMI 34.33 kg/m    Physical Exam Vitals and nursing note reviewed.  Constitutional:      Appearance: She is well-developed and well-nourished.  HENT:     Head: Normocephalic and atraumatic.  Eyes:     Extraocular Movements: EOM normal.     Conjunctiva/sclera: Conjunctivae normal.  Cardiovascular:     Pulses: Intact distal pulses.  Pulmonary:     Effort: Pulmonary effort is normal.  Abdominal:     Palpations: Abdomen is soft.  Musculoskeletal:     Cervical back: Normal range of motion.  Skin:    General: Skin is warm and dry.     Capillary Refill: Capillary refill takes less than 2 seconds.     Comments: Hypopigmentation overlying bilateral CMC joints s/p cortisone injections   Neurological:     Mental Status: She is alert and oriented to person, place, and time.  Psychiatric:        Mood and Affect: Mood and affect normal.        Behavior: Behavior normal.      Musculoskeletal Exam: C-spine limited ROM with lateral rotation.  Mild postural thoracic kyphosis noted.  Painful ROM of lumbar spine.  Shoulder joints, elbow joints, wrist joints, MCPs, PIPs, and DIPs good ROM with no  synovitis.  Complete fist formation bilaterally.  [place] joint prominence bilaterally.  Hip joints good ROM with no discomfort.  Left knee replacement has good ROM.  Right knee partial replacement has good ROM with some stiffness.  Ankle joints good ROM with  no tenderness or inflammation.   CDAI Exam: CDAI Score: -- Patient Global: --; Provider Global: -- Swollen: --; Tender: -- Joint Exam 12/31/2020   No joint exam has been documented for this visit   There is currently no information documented on the homunculus. Go to the Rheumatology activity and complete the homunculus joint exam.  Investigation: No additional findings.  Imaging: No results found.  Recent Labs: Lab Results  Component Value Date   WBC 6.6 09/12/2020   HGB 15.5 09/12/2020   PLT 183 09/12/2020   NA 141 09/12/2020   K 4.8 09/12/2020   CL 104 09/12/2020   CO2 27 09/12/2020   GLUCOSE 105 (H) 09/12/2020   BUN 20 09/12/2020   CREATININE 0.85 09/12/2020   BILITOT 0.7 04/09/2020   ALKPHOS 56 04/09/2020   AST 22 04/09/2020   ALT 23 04/09/2020   PROT 6.9 04/09/2020   ALBUMIN 4.2 04/09/2020   CALCIUM 9.8 09/12/2020   GFRAA 77 09/12/2020    Speciality Comments: No specialty comments available.  Procedures:  No procedures performed Allergies: Patient has no known allergies.   Assessment / Plan:     Visit Diagnoses: Fibromyalgia: She has not had any recent fibromyalgia flares.  She has no tender points on exam.  Her energy level has been stable. She has been sleeping well at night. She has started using a cubii exercise bike to work on lower extremity muscle strengthening. Discussed the importance of regular exercise and good sleep hygiene.  She will follow up in 6 months.   Primary osteoarthritis of both hands -She is not having any discomfort or joint swelling in her hands at this time. She has no joint tenderness or synovitis on exam. Complete fist formation bilaterally. She underwent ultrasound guided  bilateral CMC joint injections on 07/02/2020, which alleviated her discomfort. She has been knitting on a regular basis without difficulty. Joint protection and muscle strengthening were discussed.   Status post total left knee replacement: Doing well. She has good ROM with no discomfort.   Status post right partial knee replacement: According to the patient she underwent visco gel injections by her orthopedist a couple of months ago, which have alleviated her discomfort. She continues to experience intermittent stiffness.  She has good ROM on exam today.  She has started using a cubii exercise bike at home to work on lower extremity muscle strengthening.    Chronic bilateral low back pain without sciatica: X-rays of the lumbar spine updated on 11/27/20 revealed curvature, degenerative anterolisthesis at L3-L4 and L4-5.  Lumbar region facet arthropathy, right worse than left. Unchanged sine September 2019.  She was evaluated by her PCP [name] on 11/27/20 for persistent lower back pain.  She was referred to physical therapy and has been going twice a week.  She has started to notify some alleviation of her back pain. She has no midline spinal tenderness on exam today.  Encouraged her to use warm compression or a heating pad.  She takes tramadol 50 mg 1 tablet by mouth every 6 hours as needed for pain relief. She takes tramadol very sparingly.   Other medical conditions are listed as follows:   Pulmonary fibrosis (H[facility])  Allergic asthma, mild intermittent, uncomplicated  History of hypothyroidism  History of colonic polyps  Prediabetes  Malignant neoplasm of lower-outer quadrant of right breast of female, estrogen receptor positive ([facility])  History of IBS  History of nephrolithiasis  History of gastroesophageal reflux (GERD)  History of hyperlipidemia  MDD (major  depressive disorder), recurrent episode, mild ([place])  Orders: No orders of the defined types were placed in this  encounter.  No orders of the defined types were placed in this encounter.     Follow-Up Instructions: Return in about 6 months (around 06/30/2021) for Fibromyalgia, Osteoarthritis.   Ofilia Neas, PA-C  Note - This record has been created using Dragon software.  Chart creation errors have been sought, but may not always  have been located. Such creation errors do not reflect on  the standard of medical care.

## 2020-12-28 ENCOUNTER — Encounter: Payer: Self-pay | Admitting: Physical Therapy

## 2020-12-28 NOTE — Therapy (Signed)
[place], [place], [address] Phone: [phone]   Fax:  [phone]  Physical Therapy Evaluation  Patient Details  Name: Jean Nash MRN: [id number] Date of Birth: [birth year] Referring Provider (PT): [name]   Encounter Date: 12/15/2020   PT End of Session - 12/28/20 1902    Visit Number 1    Number of Visits 12    Date for PT Re-Evaluation 01/26/21    Authorization Type [employer] Medicare    PT Start Time 1300    PT Stop Time 1342    PT Time Calculation (min) 42 min    Activity Tolerance Patient tolerated treatment well    Behavior During Therapy [facility] for tasks assessed/performed           Past Medical History:  Diagnosis Date  . Adenomatous colon polyp   . Anxiety    PHOBIAS  . Arthritis   . Breast cancer ([place]) 08/08/13   right LOQ  . Cataract   . Chronic insomnia   . Cluster headaches    history of migraines / NONE FOR SEVERAL YRS  . Depression   . Diverticulosis   . Fatty liver 2011  . Fibromyalgia   . GERD (gastroesophageal reflux disease)   . H/O hiatal hernia   . History of transfusion 08/30/2013  . Hx of radiation therapy 10/29/13- 12/14/13   right chest wall 5040 cGy 28 sessions, right supraclavicular/axillary region 5040 cGy 28 sessions, right chest wall boost 1000 cGy 5 sessions  . Hypothyroidism   . Internal hemorrhoids   . Irritable bowel syndrome   . Kidney stone   . Lymphedema    RT ARM - WEARS SLEEVE  . Macular degeneration    hole/right eye  . MDD (major depressive disorder)   . Osteopenia   . Other abnormal glucose   . Other and unspecified hyperlipidemia   . Pain in joint, shoulder region   . Pneumonia 7616,0737  . Sleep apnea    USES C-PAP  . Stress incontinence, female     Past Surgical History:  Procedure Laterality Date  . ABDOMINAL HYSTERECTOMY    . APPENDECTOMY    . BILATERAL TOTAL MASTECTOMY WITH AXILLARY LYMPH NODE DISSECTION  08/30/2013   Dr  Barry Dienes  . BREAST CYST ASPIRATION     9 cysts  . CATARACT EXTRACTION, BILATERAL  2005/2007  . CHOLECYSTECTOMY    . COLONOSCOPY    . CYSTOCELE REPAIR    . EVACUATION BREAST HEMATOMA Left 08/31/2013   Procedure: EVACUATION HEMATOMA BREAST;  Surgeon: Stark Klein, MD;  Location: [place];  Service: General;  Laterality: Left;  . EYE SURGERY     to repair macular hole  . FOOT ARTHROPLASTY     lt   . GANGLION CYST EXCISION     rt foot  . HEMORRHOID SURGERY     03/1993  . JOINT REPLACEMENT  03/15/11   left knee replacement  . KNEE ARTHROSCOPY     /partial knee 2016/left knee 2012  . MASS EXCISION  11/04/2011   Procedure: EXCISION MASS;  Surgeon: Cammie Sickle., MD;  Location: [place];  Service: Orthopedics;  Laterality: Right;  excisional biopsy right ulna mass  . MASTECTOMY W/ SENTINEL NODE BIOPSY Right 08/30/2013   Procedure: RIGHT  AXILLARY SENTINEL LYMPH NODE BIOPSY; Right Axillary Node Disection;  Surgeon: Stark Klein, MD;  Location: [place];  Service: General;  Laterality: Right;  Right side nuc med 7:00   .  PARTIAL KNEE ARTHROPLASTY Right 11/03/2015   Procedure: RIGHT KNEE MEDIAL UNICOMPARTMENTAL ARTHROPLASTY ;  Surgeon: Gaynelle Arabian, MD;  [facility];  Service: Orthopedics;  Laterality: Right;  . RECTOCELE REPAIR    . SIMPLE MASTECTOMY WITH AXILLARY SENTINEL NODE BIOPSY Left 08/30/2013   Procedure: Bilateral Breast Mastectomy ;  Surgeon: Stark Klein, MD;  Location: [place];  Service: General;  Laterality: Left;  . skin tags removed     breast, panty line, neckline  . TOE SURGERY     preventative crossover toe surg/right foot  . TOE SURGERY  2009   left foot/screw  in 2nd toe  . TONSILLECTOMY    . UPPER GASTROINTESTINAL ENDOSCOPY      There were no vitals filed for this visit.    Subjective Assessment - 12/28/20 1854    Subjective Pt states chronic back pain, as well as bil knee pain. Has had recent injections for knees, did help some. Continued to  have bothersome back pain, difficulty standing/walking for prolonged time, increased pain with initial standing/transfer. Currently not doing any exercise for low back, has stationary peddler.    Pertinent History Breast CA and radiation 2014.    Limitations Sitting;Lifting;Standing;Walking;House hold activities    Patient Stated Goals decreased pain    Currently in Pain? Yes    Pain Score 8     Pain Location Back    Pain Orientation Left;Right    Pain Descriptors / Indicators Aching    Pain Type Chronic pain    Pain Onset More than a month ago    Pain Frequency Intermittent    Aggravating Factors  initial standing/transfers              [facility] PT Assessment - 12/28/20 0001      Assessment   Medical Diagnosis Back pain    Referring Provider (PT) [name]    Prior Therapy no      Balance Screen   Has the patient fallen in the past 6 months No      Prior Function   Level of Independence Independent      Cognition   Overall Cognitive Status Within Functional Limits for tasks assessed      AROM   Lumbar Flexion mild limitation    Lumbar Extension mod/sig limitation    Lumbar - Right Side Bend mod limitation    Lumbar - Left Side Bend mod limitation      Strength   Overall Strength Comments Knees: 4/5, Hips: 4-/5      Palpation   Palpation comment Tenderness in central lumbar spine with PAs, Soreness in bil lumbar musculature and into SI.      Special Tests   Other special tests Neg SLR                    Flowsheet Row Outpatient Rehab from 08/24/2018 in [place]  Lymphedema Life Impact Scale Total Score 27.94 %      Objective measurements completed on examination: See above findings.       [facility] Adult PT Treatment/Exercise - 12/28/20 0001      Exercises   Exercises Lumbar      Lumbar Exercises: Stretches   Active Hamstring Stretch 3 reps;30 seconds    Single Knee to Chest Stretch 3 reps;30 seconds     Pelvic Tilt 10 reps                  PT Education - 12/28/20 1902  Education Details PT POC, Exam findings, HEP,    Person(s) Educated Patient    Methods Explanation;Demonstration;Tactile cues;Verbal cues;Handout    Comprehension Verbalized understanding;Returned demonstration;Verbal cues required;Tactile cues required;Need further instruction            PT Short Term Goals - 12/28/20 1905      PT SHORT TERM GOAL #1   Title Pt to be independent with HEP    Time 2    Period Weeks    Status New    Target Date 12/29/20             PT Long Term Goals - 12/28/20 1905      PT LONG TERM GOAL #1   Title Pt to be independent with final HEP    Time 6    Period Weeks    Status New    Target Date 01/26/21      PT LONG TERM GOAL #2   Title Pt to report decreased pain in low back to 0-3/10 with standing and walking activity    Time 6    Period Weeks    Status New    Target Date 01/26/21      PT LONG TERM GOAL #3   Title Pt to report ability for ambulation for at least 30 min, with pain no greater than 3/10 , to improve ability for IADLS.    Time 6    Period Weeks    Status New    Target Date 01/26/21      PT LONG TERM GOAL #4   Title Pt to demo improved lumbar ROM to have only minimal deficit, and no pain, to improve ADLs and IADLS.    Time 6    Period Weeks    Status New    Target Date 01/26/21                  Plan - 12/28/20 1909    Clinical Impression Statement Pt presents with primary complaint of increased pain in low back. She has increased pain with initial transfers, as well as with standing and walking. Pt with decreased funcitonal activity, due to pain. She has lack of effective HEP for her Dx. Pt to benefit from skilled PT to improve deficits and pain.    Personal Factors and Comorbidities Fitness    Examination-Activity Limitations Locomotion Level;Transfers;Bend;Sit;Squat;Stairs;Stand;Lift    Examination-Participation Restrictions Meal  Prep;Cleaning;Community Activity;Shop    Stability/Clinical Decision Making Stable/Uncomplicated    Clinical Decision Making Low    Rehab Potential Good    PT Frequency 2x / week    PT Duration 6 weeks    PT Treatment/Interventions ADLs/Self Care Home Management;Cryotherapy;Electrical Stimulation;Iontophoresis 4mg /ml Dexamethasone;Moist Heat;Traction;Ultrasound;Balance training;Therapeutic exercise;Therapeutic activities;Functional mobility training;Stair training;Gait training;Neuromuscular re-education;Patient/family education;Manual techniques;Passive range of motion;Vasopneumatic Device;Taping;Dry needling;Spinal Manipulations;Joint Manipulations    PT Home Exercise Plan [id number]    Consulted and Agree with Plan of Care Patient           Patient will benefit from skilled therapeutic intervention in order to improve the following deficits and impairments:  Decreased range of motion,Difficulty walking,Decreased activity tolerance,Impaired UE functional use,Increased muscle spasms,Pain,Impaired flexibility,Improper body mechanics,Decreased strength,Decreased mobility  Visit Diagnosis: Chronic bilateral low back pain without sciatica     Problem List Patient Active Problem List   Diagnosis Date Noted  . Genetic testing 08/05/2020  . Fibromyalgia   . MDD (major depressive disorder), recurrent, in full remission ([place]) 12/10/2019  . Pulmonary fibrosis ([place]) 12/04/2018  . MDD (major depressive disorder),  recurrent episode, mild ([place]) 12/22/2017  . Osteoporosis 07/14/2017  . OA (osteoarthritis) of knee 11/03/2015  . Osteopenia 08/08/2015  . Headache 02/03/2015  . Atypical chest pain 07/15/2014  . Arthralgia 02/22/2014  . Psoriasis 02/22/2014  . Malignant neoplasm of lower-outer quadrant of right breast of female, estrogen receptor positive ([place]) 08/09/2013  . Neck pain 06/22/2013  . Left knee pain 11/29/2012  . Reactive depression (situational) 04/26/2012  . Right wrist pain  02/02/2012  . Right foot pain 10/04/2011  . Nevus 06/05/2011  . Status post total knee replacement 04/06/2011  . Overactive bladder 03/14/2011  . KNEE PAIN, LEFT 08/31/2010  . HEARING LOSS 08/17/2010  . HIRSUTISM 06/16/2009  . CYST, IDIOPATHIC 05/20/2008  . IRRITABLE BOWEL SYNDROME 04/15/2008  . Allergic asthma, mild intermittent, uncomplicated 28/76/8115  . GERD 03/19/2008  . COLONIC POLYPS, HX OF 03/19/2008  . NEPHROLITHIASIS, HX OF 03/19/2008  . Hypothyroidism 03/18/2008  . Hyperlipidemia 03/18/2008  . INSOMNIA, CHRONIC 03/18/2008  . FIBROMYALGIA 03/18/2008  . Prediabetes 03/18/2008   Lyndee Hensen, PT, DPT 7:23 PM  12/28/20    [place], [place], [address] Phone: [phone]   Fax:  [phone]  Name: Jean Nash MRN: [id number] Date of Birth: [birth year]

## 2020-12-29 ENCOUNTER — Encounter: Payer: Self-pay | Admitting: Physical Therapy

## 2020-12-29 ENCOUNTER — Ambulatory Visit (INDEPENDENT_AMBULATORY_CARE_PROVIDER_SITE_OTHER): Payer: Medicare HMO | Admitting: Physical Therapy

## 2020-12-29 ENCOUNTER — Encounter: Payer: Medicare HMO | Admitting: Physical Therapy

## 2020-12-29 ENCOUNTER — Other Ambulatory Visit: Payer: Self-pay

## 2020-12-29 DIAGNOSIS — M545 Low back pain, unspecified: Secondary | ICD-10-CM

## 2020-12-29 DIAGNOSIS — G8929 Other chronic pain: Secondary | ICD-10-CM

## 2020-12-29 NOTE — Patient Instructions (Signed)
SIT TO STAND - NO SUPPORT  Start by scooting close to the front of the chair. Next, lean forward at your trunk and reach forward with your arms and rise to standing without using your hands to push off from the chair or other object.  Use your arms as a counter-balance by reaching forward when in sitting and lower them as you approach standing.  Video # [id number]  Repeat 10 Times Hold 1 Second Complete 1 Set Perform 2 Times a Day  POSTERIOR PELVIC TILT  Lie on your back with your knees bent. Roll your pelvis backward (like the arrow shows in the picture) by tightening your lower abdominal muscles. You will feel your back flatten into the mat. It should feel like "good pressure", and it should not cause pain. Hold this position for 5-10 seconds, and then relax for the same amount of time.  Repeat 15 Times Hold 1 Second Complete 1 Set Perform 2 Times a Day  [place]  While lying on your back with knees bent, tighten your lower abdominal muscles, squeeze your buttocks and then raise your buttocks off the floor/bed as creating a "Bridge" with your body. Hold and then lower yourself and repeat.  Video # [id number]  Repeat 10 Times Hold 3 Seconds Complete 2 Sets Perform 2 Times a Day

## 2020-12-29 NOTE — Therapy (Signed)
[place] [address] [place], [place], [address] Phone: [phone]   Fax:  [phone]  Physical Therapy Treatment  Patient Details  Name: Jean Nash MRN: [id number] Date of Birth: 1941-07-26 Referring Provider (PT): [name]   Encounter Date: 12/29/2020   PT End of Session - 12/29/20 1659    Visit Number 2    Number of Visits 12    Date for PT Re-Evaluation 01/26/21    Authorization Type Aetna Medicare    PT Start Time 1600    PT Stop Time 4562    PT Time Calculation (min) 46 min    Activity Tolerance Patient tolerated treatment well    Behavior During Therapy [facility] for tasks assessed/performed           Past Medical History:  Diagnosis Date  . Adenomatous colon polyp   . Anxiety    PHOBIAS  . Arthritis   . Breast cancer ([place]) 08/08/13   right LOQ  . Cataract   . Chronic insomnia   . Cluster headaches    history of migraines / NONE FOR SEVERAL YRS  . Depression   . Diverticulosis   . Fatty liver 2011  . Fibromyalgia   . GERD (gastroesophageal reflux disease)   . H/O hiatal hernia   . History of transfusion 08/30/2013  . Hx of radiation therapy 10/29/13- 12/14/13   right chest wall 5040 cGy 28 sessions, right supraclavicular/axillary region 5040 cGy 28 sessions, right chest wall boost 1000 cGy 5 sessions  . Hypothyroidism   . Internal hemorrhoids   . Irritable bowel syndrome   . Kidney stone   . Lymphedema    RT ARM - WEARS SLEEVE  . Macular degeneration    hole/right eye  . MDD (major depressive disorder)   . Osteopenia   . Other abnormal glucose   . Other and unspecified hyperlipidemia   . Pain in joint, shoulder region   . Pneumonia 5638,9373  . Sleep apnea    USES C-PAP  . Stress incontinence, female     Past Surgical History:  Procedure Laterality Date  . ABDOMINAL HYSTERECTOMY    . APPENDECTOMY    . BILATERAL TOTAL MASTECTOMY WITH AXILLARY LYMPH NODE DISSECTION  08/30/2013   Dr  Barry Dienes  . BREAST CYST ASPIRATION     9 cysts  . CATARACT EXTRACTION, BILATERAL  2005/2007  . CHOLECYSTECTOMY    . COLONOSCOPY    . CYSTOCELE REPAIR    . EVACUATION BREAST HEMATOMA Left 08/31/2013   Procedure: EVACUATION HEMATOMA BREAST;  Surgeon: Stark Klein, MD;  Location: [place];  Service: General;  Laterality: Left;  . EYE SURGERY     to repair macular hole  . FOOT ARTHROPLASTY     lt   . GANGLION CYST EXCISION     rt foot  . HEMORRHOID SURGERY     03/1993  . JOINT REPLACEMENT  03/15/11   left knee replacement  . KNEE ARTHROSCOPY     /partial knee 2016/left knee 2012  . MASS EXCISION  11/04/2011   Procedure: EXCISION MASS;  Surgeon: Cammie Sickle., MD;  Location: [place];  Service: Orthopedics;  Laterality: Right;  excisional biopsy right ulna mass  . MASTECTOMY W/ SENTINEL NODE BIOPSY Right 08/30/2013   Procedure: RIGHT  AXILLARY SENTINEL LYMPH NODE BIOPSY; Right Axillary Node Disection;  Surgeon: Stark Klein, MD;  Location: [place];  Service: General;  Laterality: Right;  Right side nuc med 7:00   .  PARTIAL KNEE ARTHROPLASTY Right 11/03/2015   Procedure: RIGHT KNEE MEDIAL UNICOMPARTMENTAL ARTHROPLASTY ;  Surgeon: Gaynelle Arabian, MD;  [facility];  Service: Orthopedics;  Laterality: Right;  . RECTOCELE REPAIR    . SIMPLE MASTECTOMY WITH AXILLARY SENTINEL NODE BIOPSY Left 08/30/2013   Procedure: Bilateral Breast Mastectomy ;  Surgeon: Stark Klein, MD;  Location: [place];  Service: General;  Laterality: Left;  . skin tags removed     breast, panty line, neckline  . TOE SURGERY     preventative crossover toe surg/right foot  . TOE SURGERY  2009   left foot/screw  in 2nd toe  . TONSILLECTOMY    . UPPER GASTROINTESTINAL ENDOSCOPY      There were no vitals filed for this visit.   Subjective Assessment - 12/29/20 1557    Subjective Pt states she feels a little better since last session. She states the pain is not bad but it is constant. She feels that  the HEP has helped a little and she feels a little looser. She states she still sits for hours a day in her recliner knitting. She says the pain is the worst in the morning.    Patient is accompained by: Family member   Husband   Pertinent History Breast CA and radiation 2014.    Limitations Sitting;Lifting;Standing;Walking;House hold activities    Patient Stated Goals decreased pain    Currently in Pain? Yes    Pain Score 5     Pain Location Back    Pain Orientation Left;Right    Pain Descriptors / Indicators Aching;Sharp;Shooting;Spasm    Pain Type Chronic pain    Pain Onset More than a month ago    Pain Frequency Intermittent    Pain Relieving Factors massage, heat    Effect of Pain on Daily Activities limited mobility, ambulation, requires assistance from husband with ADL    Multiple Pain Sites No                     Flowsheet Row Outpatient Rehab from 08/24/2018 in [place]  Lymphedema Life Impact Scale Total Score 27.94 %            [facility] Adult PT Treatment/Exercise - 12/29/20 1705      Exercises   Exercises Lumbar      Lumbar Exercises: Stretches   Active Hamstring Stretch 3 reps;30 seconds    Single Knee to Chest Stretch 3 reps;30 seconds    Pelvic Tilt 10 reps      Lumbar Exercises: Seated   Sit to Stand 5 reps    Sit to Stand Limitations 2 sets      Lumbar Exercises: Supine   Bridge 10 reps    Bridge Limitations unable to clear      Manual Therapy   Manual Therapy Joint mobilization;Soft tissue mobilization    Joint Mobilization L2-5 grade II PA    Soft tissue mobilization bilat L/S para and QL                  PT Education - 12/29/20 1659    Education Details HEP, movement variation, L/S anatomy, exercise progression, video resources for HEP form/technique    Person(s) Educated Patient;Spouse    Methods Handout;Explanation    Comprehension Verbalized understanding            PT Short  Term Goals - 12/28/20 1905      PT SHORT TERM GOAL #1   Title Pt to  be independent with HEP    Time 2    Period Weeks    Status New    Target Date 12/29/20             PT Long Term Goals - 12/28/20 1905      PT LONG TERM GOAL #1   Title Pt to be independent with final HEP    Time 6    Period Weeks    Status New    Target Date 01/26/21      PT LONG TERM GOAL #2   Title Pt to report decreased pain in low back to 0-3/10 with standing and walking activity    Time 6    Period Weeks    Status New    Target Date 01/26/21      PT LONG TERM GOAL #3   Title Pt to report ability for ambulation for at least 30 min, with pain no greater than 3/10 , to improve ability for IADLS.    Time 6    Period Weeks    Status New    Target Date 01/26/21      PT LONG TERM GOAL #4   Title Pt to demo improved lumbar ROM to have only minimal deficit, and no pain, to improve ADLs and IADLS.    Time 6    Period Weeks    Status New    Target Date 01/26/21                 Plan - 12/29/20 1700    Clinical Impression Statement Pt was presents with increased L/S paraspinal and QL hypertonicity that is likely contributing to current pain presentation. Pt responded well to [facility] and had improvement ROM following. Pt demonstrated significant hip extensor weakness as demonstrated by inability to clear hip from table during bridging. Pt was given significant VC and TC for glute contraction during bridge and STS exercise. Pt gave verbal understanding to edu regarding hourly movement, position changes, and the effects of deconditioning. Pt would benefit from continued skilled therapy in order to address functional deficits and maximize LE and core strength for prevention of further decline and improving community mobility.    Personal Factors and Comorbidities Fitness    Examination-Activity Limitations Locomotion Level;Transfers;Bend;Sit;Squat;Stairs;Stand;Lift    Examination-Participation Restrictions  Meal Prep;Cleaning;Community Activity;Shop    Stability/Clinical Decision Making Stable/Uncomplicated    Rehab Potential Good    PT Frequency 2x / week    PT Duration 6 weeks    PT Treatment/Interventions ADLs/Self Care Home Management;Cryotherapy;Electrical Stimulation;Iontophoresis 4mg /ml Dexamethasone;Moist Heat;Traction;Ultrasound;Balance training;Therapeutic exercise;Therapeutic activities;Functional mobility training;Stair training;Gait training;Neuromuscular re-education;Patient/family education;Manual techniques;Passive range of motion;Vasopneumatic Device;Taping;Dry needling;Spinal Manipulations;Joint Manipulations    PT Home Exercise Plan [id number]    Consulted and Agree with Plan of Care Patient           Patient will benefit from skilled therapeutic intervention in order to improve the following deficits and impairments:  Decreased range of motion,Difficulty walking,Decreased activity tolerance,Impaired UE functional use,Increased muscle spasms,Pain,Impaired flexibility,Improper body mechanics,Decreased strength,Decreased mobility  Visit Diagnosis: Chronic bilateral low back pain without sciatica     Problem List Patient Active Problem List   Diagnosis Date Noted  . Genetic testing 08/05/2020  . Fibromyalgia   . MDD (major depressive disorder), recurrent, in full remission ([place]) 12/10/2019  . Pulmonary fibrosis ([place]) 12/04/2018  . MDD (major depressive disorder), recurrent episode, mild ([place]) 12/22/2017  . Osteoporosis 07/14/2017  . OA (osteoarthritis) of knee 11/03/2015  . Osteopenia 08/08/2015  . Headache  02/03/2015  . Atypical chest pain 07/15/2014  . Arthralgia 02/22/2014  . Psoriasis 02/22/2014  . Malignant neoplasm of lower-outer quadrant of right breast of female, estrogen receptor positive ([place]) 08/09/2013  . Neck pain 06/22/2013  . Left knee pain 11/29/2012  . Reactive depression (situational) 04/26/2012  . Right wrist pain 02/02/2012  . Right foot pain  10/04/2011  . Nevus 06/05/2011  . Status post total knee replacement 04/06/2011  . Overactive bladder 03/14/2011  . KNEE PAIN, LEFT 08/31/2010  . HEARING LOSS 08/17/2010  . HIRSUTISM 06/16/2009  . CYST, IDIOPATHIC 05/20/2008  . IRRITABLE BOWEL SYNDROME 04/15/2008  . Allergic asthma, mild intermittent, uncomplicated 62/82/4175  . GERD 03/19/2008  . COLONIC POLYPS, HX OF 03/19/2008  . NEPHROLITHIASIS, HX OF 03/19/2008  . Hypothyroidism 03/18/2008  . Hyperlipidemia 03/18/2008  . INSOMNIA, CHRONIC 03/18/2008  . FIBROMYALGIA 03/18/2008  . Prediabetes 03/18/2008   Daleen Bo PT, DPT 12/29/20 5:08 PM   [place], [place], [address] Phone: [phone]   Fax:  [phone]  Name: Maela Takeda MRN: [id number] Date of Birth: 07-16-1941

## 2020-12-31 ENCOUNTER — Encounter: Payer: Self-pay | Admitting: Physician Assistant

## 2020-12-31 ENCOUNTER — Other Ambulatory Visit: Payer: Self-pay

## 2020-12-31 ENCOUNTER — Encounter: Payer: Medicare HMO | Admitting: Physical Therapy

## 2020-12-31 ENCOUNTER — Ambulatory Visit (INDEPENDENT_AMBULATORY_CARE_PROVIDER_SITE_OTHER): Payer: Medicare HMO | Admitting: Physician Assistant

## 2020-12-31 VITALS — BP 135/83 | HR 64 | Resp 16 | Ht 60.0 in | Wt 175.8 lb

## 2020-12-31 DIAGNOSIS — Z87442 Personal history of urinary calculi: Secondary | ICD-10-CM

## 2020-12-31 DIAGNOSIS — F330 Major depressive disorder, recurrent, mild: Secondary | ICD-10-CM

## 2020-12-31 DIAGNOSIS — Z170 Estrogen receptor positive status [ER+]: Secondary | ICD-10-CM

## 2020-12-31 DIAGNOSIS — M19041 Primary osteoarthritis, right hand: Secondary | ICD-10-CM

## 2020-12-31 DIAGNOSIS — R7303 Prediabetes: Secondary | ICD-10-CM

## 2020-12-31 DIAGNOSIS — Z96652 Presence of left artificial knee joint: Secondary | ICD-10-CM

## 2020-12-31 DIAGNOSIS — C50511 Malignant neoplasm of lower-outer quadrant of right female breast: Secondary | ICD-10-CM

## 2020-12-31 DIAGNOSIS — M797 Fibromyalgia: Secondary | ICD-10-CM

## 2020-12-31 DIAGNOSIS — Z96651 Presence of right artificial knee joint: Secondary | ICD-10-CM | POA: Diagnosis not present

## 2020-12-31 DIAGNOSIS — M19042 Primary osteoarthritis, left hand: Secondary | ICD-10-CM

## 2020-12-31 DIAGNOSIS — G8929 Other chronic pain: Secondary | ICD-10-CM

## 2020-12-31 DIAGNOSIS — Z86010 Personal history of colonic polyps: Secondary | ICD-10-CM

## 2020-12-31 DIAGNOSIS — Z8719 Personal history of other diseases of the digestive system: Secondary | ICD-10-CM

## 2020-12-31 DIAGNOSIS — J8410 Pulmonary fibrosis, unspecified: Secondary | ICD-10-CM

## 2020-12-31 DIAGNOSIS — Z8639 Personal history of other endocrine, nutritional and metabolic disease: Secondary | ICD-10-CM

## 2020-12-31 DIAGNOSIS — M545 Low back pain, unspecified: Secondary | ICD-10-CM

## 2020-12-31 DIAGNOSIS — J4520 Mild intermittent asthma, uncomplicated: Secondary | ICD-10-CM

## 2021-01-05 ENCOUNTER — Encounter: Payer: Self-pay | Admitting: Physical Therapy

## 2021-01-05 ENCOUNTER — Encounter: Payer: Medicare HMO | Admitting: Physical Therapy

## 2021-01-05 ENCOUNTER — Other Ambulatory Visit: Payer: Self-pay

## 2021-01-05 ENCOUNTER — Ambulatory Visit (INDEPENDENT_AMBULATORY_CARE_PROVIDER_SITE_OTHER): Payer: Medicare HMO | Admitting: Physical Therapy

## 2021-01-05 DIAGNOSIS — M545 Low back pain, unspecified: Secondary | ICD-10-CM

## 2021-01-05 DIAGNOSIS — G8929 Other chronic pain: Secondary | ICD-10-CM | POA: Diagnosis not present

## 2021-01-05 NOTE — Therapy (Signed)
[facility] [address] [place], [place], [address] Phone: [phone]   Fax:  [phone]  Physical Therapy Treatment  Patient Details  Name: Jean Nash MRN: [id number] Date of Birth: [birth year] Referring Provider (PT): [name]   Encounter Date: 01/05/2021   PT End of Session - 01/05/21 1355    Visit Number 3    Number of Visits 12    Date for PT Re-Evaluation 01/26/21    Authorization Type [employer]    PT Start Time 1300    PT Stop Time 1345    PT Time Calculation (min) 45 min    Activity Tolerance Patient tolerated treatment well    Behavior During Therapy [facility] for tasks assessed/performed           Past Medical History:  Diagnosis Date  . Adenomatous colon polyp   . Anxiety    PHOBIAS  . Arthritis   . Breast cancer ([place]) 08/08/13   right LOQ  . Cataract   . Chronic insomnia   . Cluster headaches    history of migraines / NONE FOR SEVERAL YRS  . Depression   . Diverticulosis   . Fatty liver 2011  . Fibromyalgia   . GERD (gastroesophageal reflux disease)   . H/O hiatal hernia   . History of transfusion 08/30/2013  . Hx of radiation therapy 10/29/13- 12/14/13   right chest wall 5040 cGy 28 sessions, right supraclavicular/axillary region 5040 cGy 28 sessions, right chest wall boost 1000 cGy 5 sessions  . Hypothyroidism   . Internal hemorrhoids   . Irritable bowel syndrome   . Kidney stone   . Lymphedema    RT ARM - WEARS SLEEVE  . Macular degeneration    hole/right eye  . MDD (major depressive disorder)   . Osteopenia   . Other abnormal glucose   . Other and unspecified hyperlipidemia   . Pain in joint, shoulder region   . Pneumonia 5462,7035  . Sleep apnea    USES C-PAP  . Stress incontinence, female     Past Surgical History:  Procedure Laterality Date  . ABDOMINAL HYSTERECTOMY    . APPENDECTOMY    . BILATERAL TOTAL MASTECTOMY WITH AXILLARY LYMPH NODE DISSECTION  08/30/2013   Dr  Barry Dienes  . BREAST CYST ASPIRATION     9 cysts  . CATARACT EXTRACTION, BILATERAL  2005/2007  . CHOLECYSTECTOMY    . COLONOSCOPY    . CYSTOCELE REPAIR    . EVACUATION BREAST HEMATOMA Left 08/31/2013   Procedure: EVACUATION HEMATOMA BREAST;  Surgeon: Stark Klein, MD;  Location: [facility];  Service: General;  Laterality: Left;  . EYE SURGERY     to repair macular hole  . FOOT ARTHROPLASTY     lt   . GANGLION CYST EXCISION     rt foot  . HEMORRHOID SURGERY     03/1993  . JOINT REPLACEMENT  03/15/11   left knee replacement  . KNEE ARTHROSCOPY     /partial knee 2016/left knee 2012  . MASS EXCISION  11/04/2011   Procedure: EXCISION MASS;  Surgeon: Cammie Sickle., MD;  Location: [place];  Service: Orthopedics;  Laterality: Right;  excisional biopsy right ulna mass  . MASTECTOMY W/ SENTINEL NODE BIOPSY Right 08/30/2013   Procedure: RIGHT  AXILLARY SENTINEL LYMPH NODE BIOPSY; Right Axillary Node Disection;  Surgeon: Stark Klein, MD;  Location: [place];  Service: General;  Laterality: Right;  Right side nuc med 7:00   .  PARTIAL KNEE ARTHROPLASTY Right 11/03/2015   Procedure: RIGHT KNEE MEDIAL UNICOMPARTMENTAL ARTHROPLASTY ;  Surgeon: Gaynelle Arabian, MD;  Location: [facility];  Service: Orthopedics;  Laterality: Right;  . RECTOCELE REPAIR    . SIMPLE MASTECTOMY WITH AXILLARY SENTINEL NODE BIOPSY Left 08/30/2013   Procedure: Bilateral Breast Mastectomy ;  Surgeon: Stark Klein, MD;  Location: [place] [facility];  Service: General;  Laterality: Left;  . skin tags removed     breast, panty line, neckline  . TOE SURGERY     preventative crossover toe surg/right foot  . TOE SURGERY  2009   left foot/screw  in 2nd toe  . TONSILLECTOMY    . UPPER GASTROINTESTINAL ENDOSCOPY      There were no vitals filed for this visit.   Subjective Assessment - 01/05/21 1303    Subjective Pt states that she has had decreased intensity and frequency of pain. She has been using the heating pad while sitting and  that has also offered relief. Pt states she was not able to do very many of the HEP exercises.    Patient is accompained by: Family member    Pertinent History Breast CA and radiation 2014.    Limitations Sitting;Lifting;Standing;Walking;House hold activities    Patient Stated Goals decreased pain    Currently in Pain? Yes    Pain Score 7     Pain Location Back    Pain Orientation Right;Left    Pain Descriptors / Indicators Aching;Shooting;Sharp;Spasm    Pain Type Chronic pain    Pain Onset More than a month ago    Pain Frequency Intermittent    Aggravating Factors  initial standing/ transfers    Pain Relieving Factors massage, heat    Effect of Pain on Daily Activities limited mobility, ambulation, requires assistance from husband with ADL    Multiple Pain Sites No                     Flowsheet Row Outpatient Rehab from 08/24/2018 in [place]  Lymphedema Life Impact Scale Total Score 27.94 %            [facility] Adult PT Treatment/Exercise - 01/05/21 1257      Exercises   Exercises Lumbar      Lumbar Exercises: Stretches   Active Hamstring Stretch 3 reps;30 seconds    Single Knee to Chest Stretch 3 reps;30 seconds    Pelvic Tilt 10 reps    Other Lumbar Stretch Exercise seated forward flexion stretch 10s 10x      Lumbar Exercises: Standing   Other Standing Lumbar Exercises forward step up 10x      Lumbar Exercises: Seated   Sit to Stand 10 reps    Sit to Stand Limitations 2 sets      Lumbar Exercises: Supine   Bridge 10 reps    Bridge Limitations --      Manual Therapy   Manual Therapy Joint mobilization;Soft tissue mobilization    Joint Mobilization L2-5 grade II PA    Soft tissue mobilization bilat L/S para and QL                  PT Education - 01/05/21 1355    Education Details HEP, movement variation, L/S anatomy, exercise progression, video resources for HEP form/technique    Person(s) Educated  Patient;Spouse    Methods Explanation;Demonstration    Comprehension Verbalized understanding;Returned demonstration;Verbal cues required            PT  Short Term Goals - 12/28/20 1905      PT SHORT TERM GOAL #1   Title Pt to be independent with HEP    Time 2    Period Weeks    Status New    Target Date 12/29/20             PT Long Term Goals - 12/28/20 1905      PT LONG TERM GOAL #1   Title Pt to be independent with final HEP    Time 6    Period Weeks    Status New    Target Date 01/26/21      PT LONG TERM GOAL #2   Title Pt to report decreased pain in low back to 0-3/10 with standing and walking activity    Time 6    Period Weeks    Status New    Target Date 01/26/21      PT LONG TERM GOAL #3   Title Pt to report ability for ambulation for at least 30 min, with pain no greater than 3/10 , to improve ability for IADLS.    Time 6    Period Weeks    Status New    Target Date 01/26/21      PT LONG TERM GOAL #4   Title Pt to demo improved lumbar ROM to have only minimal deficit, and no pain, to improve ADLs and IADLS.    Time 6    Period Weeks    Status New    Target Date 01/26/21                 Plan - 01/05/21 1334    Clinical Impression Statement Pt responded well to [facility] and had improvement ROM following. Pt was able to improve hip clearance with bridges, had improved glute activation, and ability to perform exercises today with more rate of force development. Especially with STS, she was able to come to standing without increased use of UE or momentum. Pt required VC and TC for Trendelenberg with step up exercise. Will review at next session. Due to report of mixed HEP compliance, no new exercises were given. Pt would benefit from continued skilled therapy in order to address functional deficits and maximize LE and core strength for prevention of further decline and improving community mobility.    Personal Factors and Comorbidities Fitness     Examination-Activity Limitations Locomotion Level;Transfers;Bend;Sit;Squat;Stairs;Stand;Lift    Examination-Participation Restrictions Meal Prep;Cleaning;Community Activity;Shop    Stability/Clinical Decision Making Stable/Uncomplicated    Clinical Decision Making Low    Rehab Potential Good    PT Frequency 2x / week    PT Duration 6 weeks    PT Treatment/Interventions ADLs/Self Care Home Management;Cryotherapy;Electrical Stimulation;Iontophoresis 4mg /ml Dexamethasone;Moist Heat;Traction;Ultrasound;Balance training;Therapeutic exercise;Therapeutic activities;Functional mobility training;Stair training;Gait training;Neuromuscular re-education;Patient/family education;Manual techniques;Passive range of motion;Vasopneumatic Device;Taping;Dry needling;Spinal Manipulations;Joint Manipulations    PT Next Visit Plan STM, joint mob, review step up, palloff press, bridge with band, pelvic tilting    Consulted and Agree with Plan of Care Patient           Patient will benefit from skilled therapeutic intervention in order to improve the following deficits and impairments:  Decreased range of motion,Difficulty walking,Decreased activity tolerance,Impaired UE functional use,Increased muscle spasms,Pain,Impaired flexibility,Improper body mechanics,Decreased strength,Decreased mobility  Visit Diagnosis: Chronic bilateral low back pain without sciatica     Problem List Patient Active Problem List   Diagnosis Date Noted  . Genetic testing 08/05/2020  . Fibromyalgia   . MDD (  major depressive disorder), recurrent, in full remission ([place]) 12/10/2019  . Pulmonary fibrosis ([place]) 12/04/2018  . MDD (major depressive disorder), recurrent episode, mild ([place]) 12/22/2017  . Osteoporosis 07/14/2017  . OA (osteoarthritis) of knee 11/03/2015  . Osteopenia 08/08/2015  . Headache 02/03/2015  . Atypical chest pain 07/15/2014  . Arthralgia 02/22/2014  . Psoriasis 02/22/2014  . Malignant neoplasm of lower-outer  quadrant of right breast of female, estrogen receptor positive ([place]) 08/09/2013  . Neck pain 06/22/2013  . Left knee pain 11/29/2012  . Reactive depression (situational) 04/26/2012  . Right wrist pain 02/02/2012  . Right foot pain 10/04/2011  . Nevus 06/05/2011  . Status post total knee replacement 04/06/2011  . Overactive bladder 03/14/2011  . KNEE PAIN, LEFT 08/31/2010  . HEARING LOSS 08/17/2010  . HIRSUTISM 06/16/2009  . CYST, IDIOPATHIC 05/20/2008  . IRRITABLE BOWEL SYNDROME 04/15/2008  . Allergic asthma, mild intermittent, uncomplicated 05/69/7948  . GERD 03/19/2008  . COLONIC POLYPS, HX OF 03/19/2008  . NEPHROLITHIASIS, HX OF 03/19/2008  . Hypothyroidism 03/18/2008  . Hyperlipidemia 03/18/2008  . INSOMNIA, CHRONIC 03/18/2008  . FIBROMYALGIA 03/18/2008  . Prediabetes 03/18/2008    Daleen Bo PT, DPT 01/05/21 2:57 PM   [facility] [address] [place], [place], [address] Phone: [phone]   Fax:  [phone]  Name: Jean Nash MRN: [id number] Date of Birth: 03-25-1941

## 2021-01-07 ENCOUNTER — Encounter: Payer: Medicare HMO | Admitting: Physical Therapy

## 2021-01-07 ENCOUNTER — Other Ambulatory Visit: Payer: Self-pay

## 2021-01-07 ENCOUNTER — Ambulatory Visit (INDEPENDENT_AMBULATORY_CARE_PROVIDER_SITE_OTHER): Payer: Medicare HMO | Admitting: Physical Therapy

## 2021-01-07 DIAGNOSIS — G8929 Other chronic pain: Secondary | ICD-10-CM | POA: Diagnosis not present

## 2021-01-07 DIAGNOSIS — M545 Low back pain, unspecified: Secondary | ICD-10-CM | POA: Diagnosis not present

## 2021-01-07 NOTE — Patient Instructions (Signed)
Add in red band for bridges and STS

## 2021-01-07 NOTE — Therapy (Signed)
[facility] [address] [place], [place], [address] Phone: [phone]   Fax:  [phone]  Physical Therapy Treatment  Patient Details  Name: Jean Nash MRN: [id number] Date of Birth: May 16, 1941 Referring Provider (PT): [name]   Encounter Date: 01/07/2021    Past Medical History:  Diagnosis Date  . Adenomatous colon polyp   . Anxiety    PHOBIAS  . Arthritis   . Breast cancer ([place]) 08/08/13   right LOQ  . Cataract   . Chronic insomnia   . Cluster headaches    history of migraines / NONE FOR SEVERAL YRS  . Depression   . Diverticulosis   . Fatty liver 2011  . Fibromyalgia   . GERD (gastroesophageal reflux disease)   . H/O hiatal hernia   . History of transfusion 08/30/2013  . Hx of radiation therapy 10/29/13- 12/14/13   right chest wall 5040 cGy 28 sessions, right supraclavicular/axillary region 5040 cGy 28 sessions, right chest wall boost 1000 cGy 5 sessions  . Hypothyroidism   . Internal hemorrhoids   . Irritable bowel syndrome   . Kidney stone   . Lymphedema    RT ARM - WEARS SLEEVE  . Macular degeneration    hole/right eye  . MDD (major depressive disorder)   . Osteopenia   . Other abnormal glucose   . Other and unspecified hyperlipidemia   . Pain in joint, shoulder region   . Pneumonia 9381,8299  . Sleep apnea    USES C-PAP  . Stress incontinence, female     Past Surgical History:  Procedure Laterality Date  . ABDOMINAL HYSTERECTOMY    . APPENDECTOMY    . BILATERAL TOTAL MASTECTOMY WITH AXILLARY LYMPH NODE DISSECTION  08/30/2013   Dr Barry Dienes  . BREAST CYST ASPIRATION     9 cysts  . CATARACT EXTRACTION, BILATERAL  2005/2007  . CHOLECYSTECTOMY    . COLONOSCOPY    . CYSTOCELE REPAIR    . EVACUATION BREAST HEMATOMA Left 08/31/2013   Procedure: EVACUATION HEMATOMA BREAST;  Surgeon: Stark Klein, MD;  Location: [facility];  Service: General;  Laterality: Left;  . EYE SURGERY     to repair macular  hole  . FOOT ARTHROPLASTY     lt   . GANGLION CYST EXCISION     rt foot  . HEMORRHOID SURGERY     03/1993  . JOINT REPLACEMENT  03/15/11   left knee replacement  . KNEE ARTHROSCOPY     /partial knee 2016/left knee 2012  . MASS EXCISION  11/04/2011   Procedure: EXCISION MASS;  Surgeon: Cammie Sickle., MD;  Location: [facility];  Service: Orthopedics;  Laterality: Right;  excisional biopsy right ulna mass  . MASTECTOMY W/ SENTINEL NODE BIOPSY Right 08/30/2013   Procedure: RIGHT  AXILLARY SENTINEL LYMPH NODE BIOPSY; Right Axillary Node Disection;  Surgeon: Stark Klein, MD;  Location: [facility];  Service: General;  Laterality: Right;  Right side nuc med 7:00   . PARTIAL KNEE ARTHROPLASTY Right 11/03/2015   Procedure: RIGHT KNEE MEDIAL UNICOMPARTMENTAL ARTHROPLASTY ;  Surgeon: Gaynelle Arabian, MD;  Location: [facility];  Service: Orthopedics;  Laterality: Right;  . RECTOCELE REPAIR    . SIMPLE MASTECTOMY WITH AXILLARY SENTINEL NODE BIOPSY Left 08/30/2013   Procedure: Bilateral Breast Mastectomy ;  Surgeon: Stark Klein, MD;  Location: [facility];  Service: General;  Laterality: Left;  . skin tags removed     breast, panty line, neckline  . TOE SURGERY  preventative crossover toe surg/right foot  . TOE SURGERY  2009   left foot/screw  in 2nd toe  . TONSILLECTOMY    . UPPER GASTROINTESTINAL ENDOSCOPY      There were no vitals filed for this visit.   Subjective Assessment - 01/07/21 1353    Subjective Pt states her back continues to get better but her R arm/underarm pain has been keeping her from moving as she would like. She states the pain has been very difficult to work around. However, her back and her HEP are going well. She has been very compliant and has been doing them with her husband regularly.    Patient is accompained by: Family member    Pertinent History Breast CA and radiation 2014.    Limitations Sitting;Lifting;Standing;Walking;House hold activities    Patient  Stated Goals decreased pain    Currently in Pain? Yes    Pain Score 5     Pain Location Back    Pain Orientation Right;Left    Pain Descriptors / Indicators Aching;Sharp    Pain Onset More than a month ago                         [facility] Adult PT Treatment/Exercise - 01/07/21 0001      Exercises   Exercises Lumbar      Lumbar Exercises: Stretches   Active Hamstring Stretch 3 reps;30 seconds    Single Knee to Chest Stretch 3 reps;30 seconds    Pelvic Tilt 10 reps    Other Lumbar Stretch Exercise seated forward flexion stretch 10s 10x      Lumbar Exercises: Standing   Other Standing Lumbar Exercises forward step up 10x   unable to perform on L due to knee and ankle pain   Other Standing Lumbar Exercises Palloff press, red 2x10 each side      Lumbar Exercises: Seated   Sit to Stand 10 reps    Sit to Stand Limitations 2 sets, add red band      Lumbar Exercises: Supine   Bridge with clamshell 20 reps    Bridge with [employer] Limitations 2x10      Manual Therapy   Manual Therapy Joint mobilization;Soft tissue mobilization    Joint Mobilization L2-5 grade II PA    Soft tissue mobilization bilat L/S para and QL                  PT Education - 01/07/21 1444    Education Details HEP, NEAT, increased daily physical movement, exercise progression    Person(s) Educated Patient;Spouse    Methods Explanation;Demonstration    Comprehension Verbalized understanding;Returned demonstration            PT Short Term Goals - 12/28/20 1905      PT SHORT TERM GOAL #1   Title Pt to be independent with HEP    Time 2    Period Weeks    Status New    Target Date 12/29/20             PT Long Term Goals - 12/28/20 1905      PT LONG TERM GOAL #1   Title Pt to be independent with final HEP    Time 6    Period Weeks    Status New    Target Date 01/26/21      PT LONG TERM GOAL #2   Title Pt to report decreased pain in low back to 0-3/10  with standing and  walking activity    Time 6    Period Weeks    Status New    Target Date 01/26/21      PT LONG TERM GOAL #3   Title Pt to report ability for ambulation for at least 30 min, with pain no greater than 3/10 , to improve ability for IADLS.    Time 6    Period Weeks    Status New    Target Date 01/26/21      PT LONG TERM GOAL #4   Title Pt to demo improved lumbar ROM to have only minimal deficit, and no pain, to improve ADLs and IADLS.    Time 6    Period Weeks    Status New    Target Date 01/26/21                 Plan - 01/07/21 1445    Clinical Impression Statement Pt had increased L/S muscle tension that was relieved with STM at beginning of today's session. Pt required extended time, edu, and cuing for understanding abdominal brace and TrA activation with bridging, STS, and Paloff press. Pt had difficulty coordinating and feeling contraction, but was able to maintain better contraction with reseting between each repetition and pairing it with diaphragmatic breathing. Likely needs review at next session. Due to L knee and ankle pain, step off were only perform on R. Pt would benefit from continued skilled therapy in order to address functional deficits and maximize LE and core strength for prevention of further decline and improving community mobility.    Personal Factors and Comorbidities Fitness    Examination-Activity Limitations Locomotion Level;Transfers;Bend;Sit;Squat;Stairs;Stand;Lift    Examination-Participation Restrictions Meal Prep;Cleaning;Community Activity;Shop    Stability/Clinical Decision Making Stable/Uncomplicated    Rehab Potential Good    PT Frequency 2x / week    PT Duration 6 weeks    PT Treatment/Interventions ADLs/Self Care Home Management;Cryotherapy;Electrical Stimulation;Iontophoresis 4mg /ml Dexamethasone;Moist Heat;Traction;Ultrasound;Balance training;Therapeutic exercise;Therapeutic activities;Functional mobility training;Stair training;Gait  training;Neuromuscular re-education;Patient/family education;Manual techniques;Passive range of motion;Vasopneumatic Device;Taping;Dry needling;Spinal Manipulations;Joint Manipulations    PT Next Visit Plan STM, joint mob, farmer's carry, hip hinging    Consulted and Agree with Plan of Care Patient           Patient will benefit from skilled therapeutic intervention in order to improve the following deficits and impairments:  Decreased range of motion,Difficulty walking,Decreased activity tolerance,Impaired UE functional use,Increased muscle spasms,Pain,Impaired flexibility,Improper body mechanics,Decreased strength,Decreased mobility  Visit Diagnosis: Chronic bilateral low back pain without sciatica     Problem List Patient Active Problem List   Diagnosis Date Noted  . Genetic testing 08/05/2020  . Fibromyalgia   . MDD (major depressive disorder), recurrent, in full remission ([place]) 12/10/2019  . Pulmonary fibrosis ([place]) 12/04/2018  . MDD (major depressive disorder), recurrent episode, mild ([place]) 12/22/2017  . Osteoporosis 07/14/2017  . OA (osteoarthritis) of knee 11/03/2015  . Osteopenia 08/08/2015  . Headache 02/03/2015  . Atypical chest pain 07/15/2014  . Arthralgia 02/22/2014  . Psoriasis 02/22/2014  . Malignant neoplasm of lower-outer quadrant of right breast of female, estrogen receptor positive ([place]) 08/09/2013  . Neck pain 06/22/2013  . Left knee pain 11/29/2012  . Reactive depression (situational) 04/26/2012  . Right wrist pain 02/02/2012  . Right foot pain 10/04/2011  . Nevus 06/05/2011  . Status post total knee replacement 04/06/2011  . Overactive bladder 03/14/2011  . KNEE PAIN, LEFT 08/31/2010  . HEARING LOSS 08/17/2010  . HIRSUTISM 06/16/2009  . CYST,  IDIOPATHIC 05/20/2008  . IRRITABLE BOWEL SYNDROME 04/15/2008  . Allergic asthma, mild intermittent, uncomplicated 71/24/5809  . GERD 03/19/2008  . COLONIC POLYPS, HX OF 03/19/2008  . NEPHROLITHIASIS, HX OF  03/19/2008  . Hypothyroidism 03/18/2008  . Hyperlipidemia 03/18/2008  . INSOMNIA, CHRONIC 03/18/2008  . FIBROMYALGIA 03/18/2008  . Prediabetes 03/18/2008    Daleen Bo PT, DPT 01/07/21 2:50 PM   [facility] [address] [place], [place], [address] Phone: [phone]   Fax:  [phone]  Name: Jean Nash MRN: [id number] Date of Birth: [birth year]

## 2021-01-12 ENCOUNTER — Other Ambulatory Visit: Payer: Self-pay

## 2021-01-12 ENCOUNTER — Encounter: Payer: Self-pay | Admitting: Physical Therapy

## 2021-01-12 ENCOUNTER — Ambulatory Visit (INDEPENDENT_AMBULATORY_CARE_PROVIDER_SITE_OTHER): Payer: Medicare HMO | Admitting: Physical Therapy

## 2021-01-12 ENCOUNTER — Encounter: Payer: Medicare HMO | Admitting: Physical Therapy

## 2021-01-12 DIAGNOSIS — G8929 Other chronic pain: Secondary | ICD-10-CM | POA: Diagnosis not present

## 2021-01-12 DIAGNOSIS — M545 Low back pain, unspecified: Secondary | ICD-10-CM

## 2021-01-12 NOTE — Therapy (Signed)
[facility] [address] [place], [place], [address] Phone: [phone]   Fax:  [phone]  Physical Therapy Treatment  Patient Details  Name: Jean Nash MRN: [id number] Date of Birth: [birth year] Referring Provider (PT): [name]   Encounter Date: 01/12/2021   PT End of Session - 01/12/21 1450    Visit Number 5    Number of Visits 12    Date for PT Re-Evaluation 01/26/21    Authorization Type [employer] Medicare    PT Start Time 1350    PT Stop Time 1430    PT Time Calculation (min) 40 min    Activity Tolerance Patient tolerated treatment well    Behavior During Therapy [facility] for tasks assessed/performed           Past Medical History:  Diagnosis Date  . Adenomatous colon polyp   . Anxiety    PHOBIAS  . Arthritis   . Breast cancer ([place]) 08/08/13   right LOQ  . Cataract   . Chronic insomnia   . Cluster headaches    history of migraines / NONE FOR SEVERAL YRS  . Depression   . Diverticulosis   . Fatty liver 2011  . Fibromyalgia   . GERD (gastroesophageal reflux disease)   . H/O hiatal hernia   . History of transfusion 08/30/2013  . Hx of radiation therapy 10/29/13- 12/14/13   right chest wall 5040 cGy 28 sessions, right supraclavicular/axillary region 5040 cGy 28 sessions, right chest wall boost 1000 cGy 5 sessions  . Hypothyroidism   . Internal hemorrhoids   . Irritable bowel syndrome   . Kidney stone   . Lymphedema    RT ARM - WEARS SLEEVE  . Macular degeneration    hole/right eye  . MDD (major depressive disorder)   . Osteopenia   . Other abnormal glucose   . Other and unspecified hyperlipidemia   . Pain in joint, shoulder region   . Pneumonia 3903,0092  . Sleep apnea    USES C-PAP  . Stress incontinence, female     Past Surgical History:  Procedure Laterality Date  . ABDOMINAL HYSTERECTOMY    . APPENDECTOMY    . BILATERAL TOTAL MASTECTOMY WITH AXILLARY LYMPH NODE DISSECTION  08/30/2013   Dr  Barry Dienes  . BREAST CYST ASPIRATION     9 cysts  . CATARACT EXTRACTION, BILATERAL  2005/2007  . CHOLECYSTECTOMY    . COLONOSCOPY    . CYSTOCELE REPAIR    . EVACUATION BREAST HEMATOMA Left 08/31/2013   Procedure: EVACUATION HEMATOMA BREAST;  Surgeon: Stark Klein, MD;  Location: [facility];  Service: General;  Laterality: Left;  . EYE SURGERY     to repair macular hole  . FOOT ARTHROPLASTY     lt   . GANGLION CYST EXCISION     rt foot  . HEMORRHOID SURGERY     03/1993  . JOINT REPLACEMENT  03/15/11   left knee replacement  . KNEE ARTHROSCOPY     /partial knee 2016/left knee 2012  . MASS EXCISION  11/04/2011   Procedure: EXCISION MASS;  Surgeon: Cammie Sickle., MD;  Location: [facility];  Service: Orthopedics;  Laterality: Right;  excisional biopsy right ulna mass  . MASTECTOMY W/ SENTINEL NODE BIOPSY Right 08/30/2013   Procedure: RIGHT  AXILLARY SENTINEL LYMPH NODE BIOPSY; Right Axillary Node Disection;  Surgeon: Stark Klein, MD;  Location: [facility];  Service: General;  Laterality: Right;  Right side nuc med 7:00   .  PARTIAL KNEE ARTHROPLASTY Right 11/03/2015   Procedure: RIGHT KNEE MEDIAL UNICOMPARTMENTAL ARTHROPLASTY ;  Surgeon: Gaynelle Arabian, MD;  Location: [facility];  Service: Orthopedics;  Laterality: Right;  . RECTOCELE REPAIR    . SIMPLE MASTECTOMY WITH AXILLARY SENTINEL NODE BIOPSY Left 08/30/2013   Procedure: Bilateral Breast Mastectomy ;  Surgeon: Stark Klein, MD;  Location: [facility];  Service: General;  Laterality: Left;  . skin tags removed     breast, panty line, neckline  . TOE SURGERY     preventative crossover toe surg/right foot  . TOE SURGERY  2009   left foot/screw  in 2nd toe  . TONSILLECTOMY    . UPPER GASTROINTESTINAL ENDOSCOPY      There were no vitals filed for this visit.   Subjective Assessment - 01/12/21 1356    Subjective Pt states her back is improving since last time she came into therapy but she states that today her back is sore from  shopping and walking a lot earlier. She states the back just hurts more with being on her feet more.    Patient is accompained by: Family member    Pertinent History Breast CA and radiation 2014.    Limitations Sitting;Lifting;Standing;Walking;House hold activities    Patient Stated Goals --    Currently in Pain? Yes    Pain Score 6     Pain Location Back    Pain Orientation Right;Left    Pain Descriptors / Indicators Aching;Sore    Pain Onset More than a month ago    Multiple Pain Sites No                     Flowsheet Row Outpatient Rehab from 08/24/2018 in [place]  Lymphedema Life Impact Scale Total Score 27.94 %            [facility] Adult PT Treatment/Exercise - 01/12/21 0001      Exercises   Exercises Lumbar      Lumbar Exercises: Stretches   Active Hamstring Stretch 3 reps;30 seconds    Single Knee to Chest Stretch --    Pelvic Tilt 10 reps    Other Lumbar Stretch Exercise LTR 3s 10x each      Lumbar Exercises: Standing   Other Standing Lumbar Exercises --    Other Standing Lumbar Exercises Palloff press, red 2x10 each side      Lumbar Exercises: Seated   Sit to Stand 10 reps    Sit to Stand Limitations 2 sets, add red band      Lumbar Exercises: Supine   Bridge with clamshell 20 reps    Bridge with Diona Foley Squeeze Limitations 2x10      Lumbar Exercises: Quadruped   Madcat/Old Horse 10 reps    Madcat/Old Horse Limitations 3s      Manual Therapy   Manual Therapy Joint mobilization;Soft tissue mobilization    Joint Mobilization L2-5 grade II PA    Soft tissue mobilization bilat L/S para and QL                  PT Education - 01/12/21 1449    Education Details HEP modifications, postural changes,  increased daily physical movement, exercise progression    Person(s) Educated Patient;Spouse    Methods Explanation;Demonstration    Comprehension Verbalized understanding;Returned demonstration             PT Short Term Goals - 12/28/20 1905      PT SHORT TERM GOAL #1  Title Pt to be independent with HEP    Time 2    Period Weeks    Status New    Target Date 12/29/20             PT Long Term Goals - 12/28/20 1905      PT LONG TERM GOAL #1   Title Pt to be independent with final HEP    Time 6    Period Weeks    Status New    Target Date 01/26/21      PT LONG TERM GOAL #2   Title Pt to report decreased pain in low back to 0-3/10 with standing and walking activity    Time 6    Period Weeks    Status New    Target Date 01/26/21      PT LONG TERM GOAL #3   Title Pt to report ability for ambulation for at least 30 min, with pain no greater than 3/10 , to improve ability for IADLS.    Time 6    Period Weeks    Status New    Target Date 01/26/21      PT LONG TERM GOAL #4   Title Pt to demo improved lumbar ROM to have only minimal deficit, and no pain, to improve ADLs and IADLS.    Time 6    Period Weeks    Status New    Target Date 01/26/21                 Plan - 01/12/21 1419    Clinical Impression Statement Pt had decreased L/S ROM and increased soft tissue tone at the beginning of today's session that was improved with manual therapy and stretching exercise. Local twitch response was elicited along L/S paraspinals on L with ischemic pressure and pin and stretch technique. Pt was able to demonstrate good form with bridge with ABD and improved control with breathing during Paloff press. Pt was alsoable to incoporate cat/cow for improved global extension and flexion without increased back pain. Pt required VC and TC for form and technique. Pt would benefit from continued skilled therapy in order to address functional deficits and maximize LE and core strength for prevention of further decline and improving community mobility.    Personal Factors and Comorbidities Fitness    Examination-Activity Limitations Locomotion  Level;Transfers;Bend;Sit;Squat;Stairs;Stand;Lift    Examination-Participation Restrictions Meal Prep;Cleaning;Community Activity;Shop    Stability/Clinical Decision Making Stable/Uncomplicated    Rehab Potential Good    PT Frequency 2x / week    PT Duration 6 weeks    PT Treatment/Interventions ADLs/Self Care Home Management;Cryotherapy;Electrical Stimulation;Iontophoresis 4mg /ml Dexamethasone;Moist Heat;Traction;Ultrasound;Balance training;Therapeutic exercise;Therapeutic activities;Functional mobility training;Stair training;Gait training;Neuromuscular re-education;Patient/family education;Manual techniques;Passive range of motion;Vasopneumatic Device;Taping;Dry needling;Spinal Manipulations;Joint Manipulations    PT Next Visit Plan STM, joint mob, farmer's carry, hip hinging, review cat cow    Consulted and Agree with Plan of Care Patient           Patient will benefit from skilled therapeutic intervention in order to improve the following deficits and impairments:  Decreased range of motion,Difficulty walking,Decreased activity tolerance,Impaired UE functional use,Increased muscle spasms,Pain,Impaired flexibility,Improper body mechanics,Decreased strength,Decreased mobility  Visit Diagnosis: Chronic bilateral low back pain without sciatica  Lumbar spine pain     Problem List Patient Active Problem List   Diagnosis Date Noted  . Genetic testing 08/05/2020  . Fibromyalgia   . MDD (major depressive disorder), recurrent, in full remission ([place]) 12/10/2019  . Pulmonary fibrosis ([place]) 12/04/2018  .  MDD (major depressive disorder), recurrent episode, mild ([place]) 12/22/2017  . Osteoporosis 07/14/2017  . OA (osteoarthritis) of knee 11/03/2015  . Osteopenia 08/08/2015  . Headache 02/03/2015  . Atypical chest pain 07/15/2014  . Arthralgia 02/22/2014  . Psoriasis 02/22/2014  . Malignant neoplasm of lower-outer quadrant of right breast of female, estrogen receptor positive ([place])  08/09/2013  . Neck pain 06/22/2013  . Left knee pain 11/29/2012  . Reactive depression (situational) 04/26/2012  . Right wrist pain 02/02/2012  . Right foot pain 10/04/2011  . Nevus 06/05/2011  . Status post total knee replacement 04/06/2011  . Overactive bladder 03/14/2011  . KNEE PAIN, LEFT 08/31/2010  . HEARING LOSS 08/17/2010  . HIRSUTISM 06/16/2009  . CYST, IDIOPATHIC 05/20/2008  . IRRITABLE BOWEL SYNDROME 04/15/2008  . Allergic asthma, mild intermittent, uncomplicated 01/13/3142  . GERD 03/19/2008  . COLONIC POLYPS, HX OF 03/19/2008  . NEPHROLITHIASIS, HX OF 03/19/2008  . Hypothyroidism 03/18/2008  . Hyperlipidemia 03/18/2008  . INSOMNIA, CHRONIC 03/18/2008  . FIBROMYALGIA 03/18/2008  . Prediabetes 03/18/2008    Daleen Bo PT, DPT 01/12/21 2:57 PM   [facility] [address] [place], [place], [address] Phone: [phone]   Fax:  [phone]  Name: Suhaylah Wampole MRN: [id number] Date of Birth: [birth year]

## 2021-01-14 ENCOUNTER — Encounter: Payer: Medicare HMO | Admitting: Physical Therapy

## 2021-01-14 ENCOUNTER — Encounter: Payer: Self-pay | Admitting: Physical Therapy

## 2021-01-14 ENCOUNTER — Other Ambulatory Visit: Payer: Self-pay

## 2021-01-14 ENCOUNTER — Ambulatory Visit (INDEPENDENT_AMBULATORY_CARE_PROVIDER_SITE_OTHER): Payer: Medicare HMO | Admitting: Physical Therapy

## 2021-01-14 DIAGNOSIS — M545 Low back pain, unspecified: Secondary | ICD-10-CM

## 2021-01-14 DIAGNOSIS — G8929 Other chronic pain: Secondary | ICD-10-CM | POA: Diagnosis not present

## 2021-01-14 NOTE — Therapy (Signed)
[facility] [address] [place], [place], [address] Phone: [phone]   Fax:  [phone]  Physical Therapy Treatment  Patient Details  Name: Jean Nash MRN: [id number] Date of Birth: [birth year] Referring Provider (PT): [name]   Encounter Date: 01/14/2021   PT End of Session - 01/14/21 1831    Visit Number 6    Number of Visits 12    Date for PT Re-Evaluation 01/26/21    Authorization Type Aetna Medicare    PT Start Time 1347    PT Stop Time 1430    PT Time Calculation (min) 43 min    Activity Tolerance Patient tolerated treatment well    Behavior During Therapy [facility] for tasks assessed/performed           Past Medical History:  Diagnosis Date  . Adenomatous colon polyp   . Anxiety    PHOBIAS  . Arthritis   . Breast cancer ([place]) 08/08/13   right LOQ  . Cataract   . Chronic insomnia   . Cluster headaches    history of migraines / NONE FOR SEVERAL YRS  . Depression   . Diverticulosis   . Fatty liver 2011  . Fibromyalgia   . GERD (gastroesophageal reflux disease)   . H/O hiatal hernia   . History of transfusion 08/30/2013  . Hx of radiation therapy 10/29/13- 12/14/13   right chest wall 5040 cGy 28 sessions, right supraclavicular/axillary region 5040 cGy 28 sessions, right chest wall boost 1000 cGy 5 sessions  . Hypothyroidism   . Internal hemorrhoids   . Irritable bowel syndrome   . Kidney stone   . Lymphedema    RT ARM - WEARS SLEEVE  . Macular degeneration    hole/right eye  . MDD (major depressive disorder)   . Osteopenia   . Other abnormal glucose   . Other and unspecified hyperlipidemia   . Pain in joint, shoulder region   . Pneumonia 7989,2119  . Sleep apnea    USES C-PAP  . Stress incontinence, female     Past Surgical History:  Procedure Laterality Date  . ABDOMINAL HYSTERECTOMY    . APPENDECTOMY    . BILATERAL TOTAL MASTECTOMY WITH AXILLARY LYMPH NODE DISSECTION  08/30/2013   Dr Barry Dienes   . BREAST CYST ASPIRATION     9 cysts  . CATARACT EXTRACTION, BILATERAL  2005/2007  . CHOLECYSTECTOMY    . COLONOSCOPY    . CYSTOCELE REPAIR    . EVACUATION BREAST HEMATOMA Left 08/31/2013   Procedure: EVACUATION HEMATOMA BREAST;  Surgeon: Stark Klein, MD;  Location: [facility];  Service: General;  Laterality: Left;  . EYE SURGERY     to repair macular hole  . FOOT ARTHROPLASTY     lt   . GANGLION CYST EXCISION     rt foot  . HEMORRHOID SURGERY     03/1993  . JOINT REPLACEMENT  03/15/11   left knee replacement  . KNEE ARTHROSCOPY     /partial knee 2016/left knee 2012  . MASS EXCISION  11/04/2011   Procedure: EXCISION MASS;  Surgeon: Cammie Sickle., MD;  Location: [facility];  Service: Orthopedics;  Laterality: Right;  excisional biopsy right ulna mass  . MASTECTOMY W/ SENTINEL NODE BIOPSY Right 08/30/2013   Procedure: RIGHT  AXILLARY SENTINEL LYMPH NODE BIOPSY; Right Axillary Node Disection;  Surgeon: Stark Klein, MD;  Location: [facility];  Service: General;  Laterality: Right;  Right side nuc med 7:00   .  PARTIAL KNEE ARTHROPLASTY Right 11/03/2015   Procedure: RIGHT KNEE MEDIAL UNICOMPARTMENTAL ARTHROPLASTY ;  Surgeon: Gaynelle Arabian, MD;  [facility];  Service: Orthopedics;  Laterality: Right;  . RECTOCELE REPAIR    . SIMPLE MASTECTOMY WITH AXILLARY SENTINEL NODE BIOPSY Left 08/30/2013   Procedure: Bilateral Breast Mastectomy ;  Surgeon: Stark Klein, MD;  Location: [facility];  Service: General;  Laterality: Left;  . skin tags removed     breast, panty line, neckline  . TOE SURGERY     preventative crossover toe surg/right foot  . TOE SURGERY  2009   left foot/screw  in 2nd toe  . TONSILLECTOMY    . UPPER GASTROINTESTINAL ENDOSCOPY      There were no vitals filed for this visit.   Subjective Assessment - 01/14/21 1352    Subjective Pt states the back is doing well today wth less freuqncy and intensity of pain since last session.    Patient is accompained by:  Family member    Pertinent History Breast CA and radiation 2014.    Limitations Sitting;Lifting;Standing;Walking;House hold activities    Patient Stated Goals decreased pain    Pain Onset More than a month ago                    [facility] from 08/24/2018 in [place]  Lymphedema Life Impact Scale Total Score 27.94 %            [facility] Adult PT Treatment/Exercise - 01/14/21 0001      Exercises   Exercises Lumbar      Lumbar Exercises: Stretches   Active Hamstring Stretch 3 reps;30 seconds    Pelvic Tilt 10 reps    Other Lumbar Stretch Exercise LTR 3s 10x each      Lumbar Exercises: Standing   Other Standing Lumbar Exercises hip hinge 20x, farmer's carry 10lbs 35 ftx 4, banded forward step bilat blue theraband 2x10    Other Standing Lumbar Exercises Palloff press, red 2x10 each side      Lumbar Exercises: Quadruped   Madcat/Old Horse 10 reps    Madcat/Old Horse Limitations 3s      Manual Therapy   Manual Therapy Joint mobilization;Soft tissue mobilization    Joint Mobilization L2-5 grade II PA    Soft tissue mobilization bilat L/S para and QL                  PT Education - 01/14/21 1831    Education Details postural changes, increased daily physical movement, exercise progression, lifting mechanics    Person(s) Educated Patient;Spouse    Methods Explanation;Demonstration    Comprehension Verbalized understanding;Returned demonstration            PT Short Term Goals - 12/28/20 1905      PT SHORT TERM GOAL #1   Title Pt to be independent with HEP    Time 2    Period Weeks    Status New    Target Date 12/29/20             PT Long Term Goals - 12/28/20 1905      PT LONG TERM GOAL #1   Title Pt to be independent with final HEP    Time 6    Period Weeks    Status New    Target Date 01/26/21      PT LONG TERM GOAL #2   Title Pt to report decreased pain in low back to  0-3/10  with standing and walking activity    Time 6    Period Weeks    Status New    Target Date 01/26/21      PT LONG TERM GOAL #3   Title Pt to report ability for ambulation for at least 30 min, with pain no greater than 3/10 , to improve ability for IADLS.    Time 6    Period Weeks    Status New    Target Date 01/26/21      PT LONG TERM GOAL #4   Title Pt to demo improved lumbar ROM to have only minimal deficit, and no pain, to improve ADLs and IADLS.    Time 6    Period Weeks    Status New    Target Date 01/26/21                 Plan - 01/14/21 1832    Clinical Impression Statement Pt responded well to [facility] and joint mobilization at the beginning of today's session with improved ROM following. Pt was able to progression exercise to include tri-planar core strengthening in standing as well as introduce hip hinging at the wall in order to promote better lumbopelvic dissociation for better sitting, squatting, and lifting mechanics. Pt will likely require review at next session. Pt would benefit from continued skilled therapy in order to address functional deficits and maximize LE and core strength for prevention of further decline and improving community mobility.    Personal Factors and Comorbidities Fitness    Examination-Activity Limitations Locomotion Level;Transfers;Bend;Sit;Squat;Stairs;Stand;Lift    Examination-Participation Restrictions Meal Prep;Cleaning;Community Activity;Shop    Stability/Clinical Decision Making Stable/Uncomplicated    Rehab Potential Good    PT Frequency 2x / week    PT Duration 6 weeks    PT Treatment/Interventions ADLs/Self Care Home Management;Cryotherapy;Electrical Stimulation;Iontophoresis 4mg /ml Dexamethasone;Moist Heat;Traction;Ultrasound;Balance training;Therapeutic exercise;Therapeutic activities;Functional mobility training;Stair training;Gait training;Neuromuscular re-education;Patient/family education;Manual techniques;Passive range of  motion;Vasopneumatic Device;Taping;Dry needling;Spinal Manipulations;Joint Manipulations    PT Next Visit Plan STM, joint mob, farmer's carry, hip hinging, review cat cow    Consulted and Agree with Plan of Care Patient           Patient will benefit from skilled therapeutic intervention in order to improve the following deficits and impairments:  Decreased range of motion,Difficulty walking,Decreased activity tolerance,Impaired UE functional use,Increased muscle spasms,Pain,Impaired flexibility,Improper body mechanics,Decreased strength,Decreased mobility  Visit Diagnosis: Chronic bilateral low back pain without sciatica  Lumbar spine pain     Problem List Patient Active Problem List   Diagnosis Date Noted  . Genetic testing 08/05/2020  . Fibromyalgia   . MDD (major depressive disorder), recurrent, in full remission ([place]) 12/10/2019  . Pulmonary fibrosis ([place]) 12/04/2018  . MDD (major depressive disorder), recurrent episode, mild ([place]) 12/22/2017  . Osteoporosis 07/14/2017  . OA (osteoarthritis) of knee 11/03/2015  . Osteopenia 08/08/2015  . Headache 02/03/2015  . Atypical chest pain 07/15/2014  . Arthralgia 02/22/2014  . Psoriasis 02/22/2014  . Malignant neoplasm of lower-outer quadrant of right breast of female, estrogen receptor positive ([place]) 08/09/2013  . Neck pain 06/22/2013  . Left knee pain 11/29/2012  . Reactive depression (situational) 04/26/2012  . Right wrist pain 02/02/2012  . Right foot pain 10/04/2011  . Nevus 06/05/2011  . Status post total knee replacement 04/06/2011  . Overactive bladder 03/14/2011  . KNEE PAIN, LEFT 08/31/2010  . HEARING LOSS 08/17/2010  . HIRSUTISM 06/16/2009  . CYST, IDIOPATHIC 05/20/2008  . IRRITABLE BOWEL SYNDROME 04/15/2008  . Allergic  asthma, mild intermittent, uncomplicated 60/12/9845  . GERD 03/19/2008  . COLONIC POLYPS, HX OF 03/19/2008  . NEPHROLITHIASIS, HX OF 03/19/2008  . Hypothyroidism 03/18/2008  . Hyperlipidemia  03/18/2008  . INSOMNIA, CHRONIC 03/18/2008  . FIBROMYALGIA 03/18/2008  . Prediabetes 03/18/2008    Daleen Bo PT, DPT 01/14/21 6:36 PM   [facility] [address] [place], [place], [address] Phone: [phone]   Fax:  [phone]  Name: Jean Nash MRN: [id number] Date of Birth: Aug 20, 1941

## 2021-01-19 ENCOUNTER — Encounter: Payer: Self-pay | Admitting: Physical Therapy

## 2021-01-19 ENCOUNTER — Encounter: Payer: Medicare HMO | Admitting: Physical Therapy

## 2021-01-19 ENCOUNTER — Other Ambulatory Visit: Payer: Self-pay

## 2021-01-19 ENCOUNTER — Ambulatory Visit (INDEPENDENT_AMBULATORY_CARE_PROVIDER_SITE_OTHER): Payer: Medicare HMO | Admitting: Physical Therapy

## 2021-01-19 DIAGNOSIS — M545 Low back pain, unspecified: Secondary | ICD-10-CM | POA: Diagnosis not present

## 2021-01-19 DIAGNOSIS — G8929 Other chronic pain: Secondary | ICD-10-CM

## 2021-01-19 NOTE — Therapy (Signed)
[facility] [address] [place], [place], [address] Phone: [phone]   Fax:  [phone]  Physical Therapy Treatment  Patient Details  Name: Jean Nash MRN: [id number] Date of Birth: October 19, 1941 Referring Provider (PT): [name]   Encounter Date: 01/19/2021   PT End of Session - 01/19/21 1406    Visit Number 7    Number of Visits 12    Date for PT Re-Evaluation 01/26/21    Authorization Type [employer] Medicare    PT Start Time 1350    PT Stop Time 1430    PT Time Calculation (min) 40 min    Activity Tolerance Patient tolerated treatment well    Behavior During Therapy [facility] for tasks assessed/performed           Past Medical History:  Diagnosis Date  . Adenomatous colon polyp   . Anxiety    PHOBIAS  . Arthritis   . Breast cancer ([place]) 08/08/13   right LOQ  . Cataract   . Chronic insomnia   . Cluster headaches    history of migraines / NONE FOR SEVERAL YRS  . Depression   . Diverticulosis   . Fatty liver 2011  . Fibromyalgia   . GERD (gastroesophageal reflux disease)   . H/O hiatal hernia   . History of transfusion 08/30/2013  . Hx of radiation therapy 10/29/13- 12/14/13   right chest wall 5040 cGy 28 sessions, right supraclavicular/axillary region 5040 cGy 28 sessions, right chest wall boost 1000 cGy 5 sessions  . Hypothyroidism   . Internal hemorrhoids   . Irritable bowel syndrome   . Kidney stone   . Lymphedema    RT ARM - WEARS SLEEVE  . Macular degeneration    hole/right eye  . MDD (major depressive disorder)   . Osteopenia   . Other abnormal glucose   . Other and unspecified hyperlipidemia   . Pain in joint, shoulder region   . Pneumonia 1941,7408  . Sleep apnea    USES C-PAP  . Stress incontinence, female     Past Surgical History:  Procedure Laterality Date  . ABDOMINAL HYSTERECTOMY    . APPENDECTOMY    . BILATERAL TOTAL MASTECTOMY WITH AXILLARY LYMPH NODE DISSECTION  08/30/2013   Dr Barry Dienes   . BREAST CYST ASPIRATION     9 cysts  . CATARACT EXTRACTION, BILATERAL  2005/2007  . CHOLECYSTECTOMY    . COLONOSCOPY    . CYSTOCELE REPAIR    . EVACUATION BREAST HEMATOMA Left 08/31/2013   Procedure: EVACUATION HEMATOMA BREAST;  Surgeon: Stark Klein, MD;  Location: [facility];  Service: General;  Laterality: Left;  . EYE SURGERY     to repair macular hole  . FOOT ARTHROPLASTY     lt   . GANGLION CYST EXCISION     rt foot  . HEMORRHOID SURGERY     03/1993  . JOINT REPLACEMENT  03/15/11   left knee replacement  . KNEE ARTHROSCOPY     /partial knee 2016/left knee 2012  . MASS EXCISION  11/04/2011   Procedure: EXCISION MASS;  Surgeon: Cammie Sickle., MD;  Location: [facility];  Service: Orthopedics;  Laterality: Right;  excisional biopsy right ulna mass  . MASTECTOMY W/ SENTINEL NODE BIOPSY Right 08/30/2013   Procedure: RIGHT  AXILLARY SENTINEL LYMPH NODE BIOPSY; Right Axillary Node Disection;  Surgeon: Stark Klein, MD;  Location: [facility];  Service: General;  Laterality: Right;  Right side nuc med 7:00   .  PARTIAL KNEE ARTHROPLASTY Right 11/03/2015   Procedure: RIGHT KNEE MEDIAL UNICOMPARTMENTAL ARTHROPLASTY ;  Surgeon: Gaynelle Arabian, MD;  Location: [facility];  Service: Orthopedics;  Laterality: Right;  . RECTOCELE REPAIR    . SIMPLE MASTECTOMY WITH AXILLARY SENTINEL NODE BIOPSY Left 08/30/2013   Procedure: Bilateral Breast Mastectomy ;  Surgeon: Stark Klein, MD;  Location: [facility];  Service: General;  Laterality: Left;  . skin tags removed     breast, panty line, neckline  . TOE SURGERY     preventative crossover toe surg/right foot  . TOE SURGERY  2009   left foot/screw  in 2nd toe  . TONSILLECTOMY    . UPPER GASTROINTESTINAL ENDOSCOPY      There were no vitals filed for this visit.   Subjective Assessment - 01/19/21 1719    Subjective Pt reports no complaints. She states her back is feeling much better and she is moving better day to day.    Patient is  accompained by: Family member    Pertinent History Breast CA and radiation 2014.    Limitations Sitting;Lifting;Standing;Walking;House hold activities    Patient Stated Goals decreased pain    Currently in Pain? No/denies    Pain Score 0-No pain    Pain Location Back    Pain Onset More than a month ago                     Flowsheet Row Outpatient Rehab from 08/24/2018 in [facility]  Lymphedema Life Impact Scale Total Score 27.94 %            [facility] Adult PT Treatment/Exercise - 01/19/21 0001      Exercises   Exercises Lumbar      Lumbar Exercises: Stretches   Active Hamstring Stretch 3 reps;30 seconds    Pelvic Tilt 10 reps    Other Lumbar Stretch Exercise LTR 3s 10x each      Lumbar Exercises: Standing   Other Standing Lumbar Exercises hip hinge 10x, box lifting mechanics, box lift from floor with airex, 20x, farmer's carry 10lbs 35 ftx 4, banded forward step bilat blue theraband 2x10    Other Standing Lumbar Exercises Palloff press, red 2x10 each side      Lumbar Exercises: Seated   Sit to Stand 20 reps      Lumbar Exercises: Quadruped   Madcat/Old Horse 10 reps    Madcat/Old Horse Limitations 3s      Manual Therapy   Manual Therapy --    Joint Mobilization --    Soft tissue mobilization --                  PT Education - 01/19/21 1719    Education Details increased daily physical movement, exercise progression, lifting mechanics. abdominal bracing with lifting    Person(s) Educated Patient;Spouse    Methods Explanation;Demonstration    Comprehension Verbalized understanding;Returned demonstration            PT Short Term Goals - 12/28/20 1905      PT SHORT TERM GOAL #1   Title Pt to be independent with HEP    Time 2    Period Weeks    Status New    Target Date 12/29/20             PT Long Term Goals - 12/28/20 1905      PT LONG TERM GOAL #1   Title Pt to be independent with final HEP  Time 6    Period Weeks    Status New    Target Date 01/26/21      PT LONG TERM GOAL #2   Title Pt to report decreased pain in low back to 0-3/10 with standing and walking activity    Time 6    Period Weeks    Status New    Target Date 01/26/21      PT LONG TERM GOAL #3   Title Pt to report ability for ambulation for at least 30 min, with pain no greater than 3/10 , to improve ability for IADLS.    Time 6    Period Weeks    Status New    Target Date 01/26/21      PT LONG TERM GOAL #4   Title Pt to demo improved lumbar ROM to have only minimal deficit, and no pain, to improve ADLs and IADLS.    Time 6    Period Weeks    Status New    Target Date 01/26/21                 Plan - 01/19/21 1411    Clinical Impression Statement Pt was able to progress hip hinging today to lifting from an elevated height at today's session. Pt required increased VC and TC for hip hinge and hip extension during lifting. Pt breaks the lift into two movements, increasing the strain on the low back during ADL simulated activity. However, pt was able to tolerate exercise without use of manual therapy at today's session. Will assess at next session for pain and stiffness. Pt would benefit from continued skileld therapy in ordre to maximize functional lumbopelvic strength and mobility for safe and pain free daily function.    Personal Factors and Comorbidities Fitness    Examination-Activity Limitations Locomotion Level;Transfers;Bend;Sit;Squat;Stairs;Stand;Lift    Examination-Participation Restrictions Meal Prep;Cleaning;Community Activity;Shop    Stability/Clinical Decision Making Stable/Uncomplicated    Rehab Potential Good    PT Frequency 2x / week    PT Duration 6 weeks    PT Treatment/Interventions ADLs/Self Care Home Management;Cryotherapy;Electrical Stimulation;Iontophoresis 4mg /ml Dexamethasone;Moist Heat;Traction;Ultrasound;Balance training;Therapeutic exercise;Therapeutic  activities;Functional mobility training;Stair training;Gait training;Neuromuscular re-education;Patient/family education;Manual techniques;Passive range of motion;Vasopneumatic Device;Taping;Dry needling;Spinal Manipulations;Joint Manipulations    PT Next Visit Plan STM, joint mob, farmer's carry, hip hinging, review cat cow    Consulted and Agree with Plan of Care Patient           Patient will benefit from skilled therapeutic intervention in order to improve the following deficits and impairments:  Decreased range of motion,Difficulty walking,Decreased activity tolerance,Impaired UE functional use,Increased muscle spasms,Pain,Impaired flexibility,Improper body mechanics,Decreased strength,Decreased mobility  Visit Diagnosis: Chronic bilateral low back pain without sciatica  Lumbar spine pain     Problem List Patient Active Problem List   Diagnosis Date Noted  . Genetic testing 08/05/2020  . Fibromyalgia   . MDD (major depressive disorder), recurrent, in full remission ([place]) 12/10/2019  . Pulmonary fibrosis ([place]) 12/04/2018  . MDD (major depressive disorder), recurrent episode, mild ([place]) 12/22/2017  . Osteoporosis 07/14/2017  . OA (osteoarthritis) of knee 11/03/2015  . Osteopenia 08/08/2015  . Headache 02/03/2015  . Atypical chest pain 07/15/2014  . Arthralgia 02/22/2014  . Psoriasis 02/22/2014  . Malignant neoplasm of lower-outer quadrant of right breast of female, estrogen receptor positive () 08/09/2013  . Neck pain 06/22/2013  . Left knee pain 11/29/2012  . Reactive depression (situational) 04/26/2012  . Right wrist pain 02/02/2012  . Right foot pain 10/04/2011  .  Nevus 06/05/2011  . Status post total knee replacement 04/06/2011  . Overactive bladder 03/14/2011  . KNEE PAIN, LEFT 08/31/2010  . HEARING LOSS 08/17/2010  . HIRSUTISM 06/16/2009  . CYST, IDIOPATHIC 05/20/2008  . IRRITABLE BOWEL SYNDROME 04/15/2008  . Allergic asthma, mild intermittent, uncomplicated  61/90/1222  . GERD 03/19/2008  . COLONIC POLYPS, HX OF 03/19/2008  . NEPHROLITHIASIS, HX OF 03/19/2008  . Hypothyroidism 03/18/2008  . Hyperlipidemia 03/18/2008  . INSOMNIA, CHRONIC 03/18/2008  . FIBROMYALGIA 03/18/2008  . Prediabetes 03/18/2008   Daleen Bo PT, DPT 01/19/21 5:21 PM   [facility] [place], [place], [address] Phone: [phone]   Fax:  [phone]  Name: Sheri Gatchel MRN: [id number] Date of Birth: 01/10/1941

## 2021-01-21 ENCOUNTER — Other Ambulatory Visit: Payer: Self-pay

## 2021-01-21 ENCOUNTER — Encounter: Payer: Self-pay | Admitting: Physical Therapy

## 2021-01-21 ENCOUNTER — Ambulatory Visit (INDEPENDENT_AMBULATORY_CARE_PROVIDER_SITE_OTHER): Payer: Medicare HMO | Admitting: Physical Therapy

## 2021-01-21 DIAGNOSIS — G8929 Other chronic pain: Secondary | ICD-10-CM

## 2021-01-21 DIAGNOSIS — M545 Low back pain, unspecified: Secondary | ICD-10-CM | POA: Diagnosis not present

## 2021-01-21 NOTE — Therapy (Signed)
[facility] [address] [place], [place], [address] Phone: [phone]   Fax:  [phone]  Physical Therapy Treatment  Patient Details  Name: Jean Nash MRN: [id number] Date of Birth: Jan 11, 1941 Referring Provider (PT): [name]   Encounter Date: 01/21/2021   PT End of Session - 01/21/21 1523    Visit Number 8    Number of Visits 12    Date for PT Re-Evaluation 01/26/21    Authorization Type A[employer]na Medicare    PT Start Time 2878    PT Stop Time 1430    PT Time Calculation (min) 45 min    Activity Tolerance Patient tolerated treatment well    Behavior During Therapy [facility] for tasks assessed/performed           Past Medical History:  Diagnosis Date  . Adenomatous colon polyp   . Anxiety    PHOBIAS  . Arthritis   . Breast cancer ([facility]) 08/08/13   right LOQ  . Cataract   . Chronic insomnia   . Cluster headaches    history of migraines / NONE FOR SEVERAL YRS  . Depression   . Diverticulosis   . Fatty liver 2011  . Fibromyalgia   . GERD (gastroesophageal reflux disease)   . H/O hiatal hernia   . History of transfusion 08/30/2013  . Hx of radiation therapy 10/29/13- 12/14/13   right chest wall 5040 cGy 28 sessions, right supraclavicular/axillary region 5040 cGy 28 sessions, right chest wall boost 1000 cGy 5 sessions  . Hypothyroidism   . Internal hemorrhoids   . Irritable bowel syndrome   . Kidney stone   . Lymphedema    RT ARM - WEARS SLEEVE  . Macular degeneration    hole/right eye  . MDD (major depressive disorder)   . Osteopenia   . Other abnormal glucose   . Other and unspecified hyperlipidemia   . Pain in joint, shoulder region   . Pneumonia 6767,2094  . Sleep apnea    USES C-PAP  . Stress incontinence, female     Past Surgical History:  Procedure Laterality Date  . ABDOMINAL HYSTERECTOMY    . APPENDECTOMY    . BILATERAL TOTAL MASTECTOMY WITH AXILLARY LYMPH NODE DISSECTION  08/30/2013   Dr Barry Dienes   . BREAST CYST ASPIRATION     9 cysts  . CATARACT EXTRACTION, BILATERAL  2005/2007  . CHOLECYSTECTOMY    . COLONOSCOPY    . CYSTOCELE REPAIR    . EVACUATION BREAST HEMATOMA Left 08/31/2013   Procedure: EVACUATION HEMATOMA BREAST;  Surgeon: Stark Klein, MD;  Location: [facility];  Service: General;  Laterality: Left;  . EYE SURGERY     to repair macular hole  . FOOT ARTHROPLASTY     lt   . GANGLION CYST EXCISION     rt foot  . HEMORRHOID SURGERY     03/1993  . JOINT REPLACEMENT  03/15/11   left knee replacement  . KNEE ARTHROSCOPY     /partial knee 2016/left knee 2012  . MASS EXCISION  11/04/2011   Procedure: EXCISION MASS;  Surgeon: Cammie Sickle., MD;  Location: [facility];  Service: Orthopedics;  Laterality: Right;  excisional biopsy right ulna mass  . MASTECTOMY W/ SENTINEL NODE BIOPSY Right 08/30/2013   Procedure: RIGHT  AXILLARY SENTINEL LYMPH NODE BIOPSY; Right Axillary Node Disection;  Surgeon: Stark Klein, MD;  Location: [facility];  Service: General;  Laterality: Right;  Right side nuc med 7:00   .  PARTIAL KNEE ARTHROPLASTY Right 11/03/2015   Procedure: RIGHT KNEE MEDIAL UNICOMPARTMENTAL ARTHROPLASTY ;  Surgeon: Gaynelle Arabian, MD;  [facility];  Service: Orthopedics;  Laterality: Right;  . RECTOCELE REPAIR    . SIMPLE MASTECTOMY WITH AXILLARY SENTINEL NODE BIOPSY Left 08/30/2013   Procedure: Bilateral Breast Mastectomy ;  Surgeon: Stark Klein, MD;  Location: [facility];  Service: General;  Laterality: Left;  . skin tags removed     breast, panty line, neckline  . TOE SURGERY     preventative crossover toe surg/right foot  . TOE SURGERY  2009   left foot/screw  in 2nd toe  . TONSILLECTOMY    . UPPER GASTROINTESTINAL ENDOSCOPY      There were no vitals filed for this visit.   Subjective Assessment - 01/21/21 1517    Subjective Pt states the back is very painful and sore today. She reports that it could be from doing more of the exercises than intended.  She reports feeling "pretty good" after the last PT session. Onset of pain started yesterday.    Patient is accompained by: Family member    Pertinent History Breast CA and radiation 2014.    Limitations Sitting;Lifting;Standing;Walking;House hold activities    Patient Stated Goals decreased pain    Currently in Pain? Yes    Pain Score 6     Pain Location Back    Pain Orientation Right    Pain Descriptors / Indicators Aching;Sore;Sharp    Pain Type Chronic pain    Pain Onset More than a month ago    Pain Frequency Intermittent                     Flowsheet Row Outpatient Rehab from 08/24/2018 in [facility]  Lymphedema Life Impact Scale Total Score 27.94 %            [facility] Adult PT Treatment/Exercise - 01/21/21 0001      Exercises   Exercises Lumbar      Lumbar Exercises: Stretches   Active Hamstring Stretch 3 reps;30 seconds    Pelvic Tilt 10 reps    Piriformis Stretch 30 seconds;2 reps    Other Lumbar Stretch Exercise LTR 3s 10x each      Lumbar Exercises: Standing   Other Standing Lumbar Exercises --    Other Standing Lumbar Exercises --      Lumbar Exercises: Seated   Sit to Stand --    Other Seated Lumbar Exercises forward flexion stretch 10s 10x    Other Seated Lumbar Exercises seated pelvic tilting 20x      Lumbar Exercises: Quadruped   Madcat/Old Horse 10 reps    Madcat/Old Horse Limitations 3s      Manual Therapy   Manual Therapy Joint mobilization;Soft tissue mobilization    Joint Mobilization L2-5 grade III PA    Soft tissue mobilization bilat L/S para and QL, R >L                  PT Education - 01/21/21 1519    Education Details increased daily physical movement, exercise progression, lifting mechanics. abdominal bracing with lifting, recovery time, continuing daily mobility, acceptable levels of pain   Person(s) Educated Patient;Spouse    Methods Explanation;Demonstration    Comprehension  Verbalized understanding;Returned demonstration            PT Short Term Goals - 12/28/20 1905      PT SHORT TERM GOAL #1   Title Pt to  be independent with HEP    Time 2    Period Weeks    Status New    Target Date 12/29/20             PT Long Term Goals - 12/28/20 1905      PT LONG TERM GOAL #1   Title Pt to be independent with final HEP    Time 6    Period Weeks    Status New    Target Date 01/26/21      PT LONG TERM GOAL #2   Title Pt to report decreased pain in low back to 0-3/10 with standing and walking activity    Time 6    Period Weeks    Status New    Target Date 01/26/21      PT LONG TERM GOAL #3   Title Pt to report ability for ambulation for at least 30 min, with pain no greater than 3/10 , to improve ability for IADLS.    Time 6    Period Weeks    Status New    Target Date 01/26/21      PT LONG TERM GOAL #4   Title Pt to demo improved lumbar ROM to have only minimal deficit, and no pain, to improve ADLs and IADLS.    Time 6    Period Weeks    Status New    Target Date 01/26/21                 Plan - 01/21/21 1524    Clinical Impression Statement Pt presented with significantly increased R L/S spasm and guarding at today's session. Pt demonstrated improved ROM and soft tissue extensibility after manual therapy and exercise. Pt was able to have more upright gait with an increase in stride length. Pt was only able to tolerate mobility and stretching exercises at today's session. Pt would benefit from continued skileld therapy in ordre to maximize functional lumbopelvic strength and mobility for safe and pain free daily function.    Personal Factors and Comorbidities Fitness    Examination-Activity Limitations Locomotion Level;Transfers;Bend;Sit;Squat;Stairs;Stand;Lift    Examination-Participation Restrictions Meal Prep;Cleaning;Community Activity;Shop    Stability/Clinical Decision Making Stable/Uncomplicated    Rehab Potential Good    PT  Frequency 2x / week    PT Duration 6 weeks    PT Treatment/Interventions ADLs/Self Care Home Management;Cryotherapy;Electrical Stimulation;Iontophoresis 4mg /ml Dexamethasone;Moist Heat;Traction;Ultrasound;Balance training;Therapeutic exercise;Therapeutic activities;Functional mobility training;Stair training;Gait training;Neuromuscular re-education;Patient/family education;Manual techniques;Passive range of motion;Vasopneumatic Device;Taping;Dry needling;Spinal Manipulations;Joint Manipulations    PT Next Visit Plan STM, joint mob, farmer's carry, hip hinging, review cat cow    Consulted and Agree with Plan of Care Patient           Patient will benefit from skilled therapeutic intervention in order to improve the following deficits and impairments:  Decreased range of motion,Difficulty walking,Decreased activity tolerance,Impaired UE functional use,Increased muscle spasms,Pain,Impaired flexibility,Improper body mechanics,Decreased strength,Decreased mobility  Visit Diagnosis: Chronic bilateral low back pain without sciatica  Lumbar spine pain     Problem List Patient Active Problem List   Diagnosis Date Noted  . Genetic testing 08/05/2020  . Fibromyalgia   . MDD (major depressive disorder), recurrent, in full remission ([place]) 12/10/2019  . Pulmonary fibrosis ([place]) 12/04/2018  . MDD (major depressive disorder), recurrent episode, mild ([place]) 12/22/2017  . Osteoporosis 07/14/2017  . OA (osteoarthritis) of knee 11/03/2015  . Osteopenia 08/08/2015  . Headache 02/03/2015  . Atypical chest pain 07/15/2014  . Arthralgia 02/22/2014  . Psoriasis 02/22/2014  .  Malignant neoplasm of lower-outer quadrant of right breast of female, estrogen receptor positive ([place]) 08/09/2013  . Neck pain 06/22/2013  . Left knee pain 11/29/2012  . Reactive depression (situational) 04/26/2012  . Right wrist pain 02/02/2012  . Right foot pain 10/04/2011  . Nevus 06/05/2011  . Status post total knee  replacement 04/06/2011  . Overactive bladder 03/14/2011  . KNEE PAIN, LEFT 08/31/2010  . HEARING LOSS 08/17/2010  . HIRSUTISM 06/16/2009  . CYST, IDIOPATHIC 05/20/2008  . IRRITABLE BOWEL SYNDROME 04/15/2008  . Allergic asthma, mild intermittent, uncomplicated 12/75/1700  . GERD 03/19/2008  . COLONIC POLYPS, HX OF 03/19/2008  . NEPHROLITHIASIS, HX OF 03/19/2008  . Hypothyroidism 03/18/2008  . Hyperlipidemia 03/18/2008  . INSOMNIA, CHRONIC 03/18/2008  . FIBROMYALGIA 03/18/2008  . Prediabetes 03/18/2008   Daleen Bo PT, DPT 01/21/21 3:39 PM   [facility] [address] [place], [place], [address] Phone: [phone]   Fax:  [phone]  Name: Jean Nash MRN: [id number] Date of Birth: 1941/04/03

## 2021-01-26 ENCOUNTER — Encounter: Payer: Self-pay | Admitting: Adult Health

## 2021-01-27 ENCOUNTER — Other Ambulatory Visit: Payer: Self-pay | Admitting: Adult Health

## 2021-01-27 DIAGNOSIS — G629 Polyneuropathy, unspecified: Secondary | ICD-10-CM

## 2021-01-28 ENCOUNTER — Ambulatory Visit (INDEPENDENT_AMBULATORY_CARE_PROVIDER_SITE_OTHER): Payer: Medicare HMO | Admitting: Physical Therapy

## 2021-01-28 ENCOUNTER — Other Ambulatory Visit: Payer: Self-pay

## 2021-01-28 ENCOUNTER — Encounter: Payer: Self-pay | Admitting: Physical Therapy

## 2021-01-28 DIAGNOSIS — G8929 Other chronic pain: Secondary | ICD-10-CM

## 2021-01-28 DIAGNOSIS — M545 Low back pain, unspecified: Secondary | ICD-10-CM

## 2021-01-28 NOTE — Therapy (Signed)
[place], [place], [address] Phone: [phone]   Fax:  [phone]  Physical Therapy Treatment/Progress Note  Patient Details  Name: Jean Nash MRN: [id number] Date of Birth: 13-Aug-1941 Referring Provider (PT): [name]   Encounter Date: 01/28/2021   PT End of Session - 01/28/21 2026    Visit Number 9    Number of Visits 12    Date for PT Re-Evaluation 02/27/21    Authorization Type [employer]na Medicare    PT Start Time 1350    PT Stop Time 1430    PT Time Calculation (min) 40 min    Activity Tolerance Patient tolerated treatment well    Behavior During Therapy [facility] for tasks assessed/performed           Past Medical History:  Diagnosis Date  . Adenomatous colon polyp   . Anxiety    PHOBIAS  . Arthritis   . Breast cancer ([place]) 08/08/13   right LOQ  . Cataract   . Chronic insomnia   . Cluster headaches    history of migraines / NONE FOR SEVERAL YRS  . Depression   . Diverticulosis   . Fatty liver 2011  . Fibromyalgia   . GERD (gastroesophageal reflux disease)   . H/O hiatal hernia   . History of transfusion 08/30/2013  . Hx of radiation therapy 10/29/13- 12/14/13   right chest wall 5040 cGy 28 sessions, right supraclavicular/axillary region 5040 cGy 28 sessions, right chest wall boost 1000 cGy 5 sessions  . Hypothyroidism   . Internal hemorrhoids   . Irritable bowel syndrome   . Kidney stone   . Lymphedema    RT ARM - WEARS SLEEVE  . Macular degeneration    hole/right eye  . MDD (major depressive disorder)   . Osteopenia   . Other abnormal glucose   . Other and unspecified hyperlipidemia   . Pain in joint, shoulder region   . Pneumonia 7673,4193  . Sleep apnea    USES C-PAP  . Stress incontinence, female     Past Surgical History:  Procedure Laterality Date  . ABDOMINAL HYSTERECTOMY    . APPENDECTOMY    . BILATERAL TOTAL MASTECTOMY WITH AXILLARY LYMPH NODE DISSECTION   08/30/2013   Dr Barry Dienes  . BREAST CYST ASPIRATION     9 cysts  . CATARACT EXTRACTION, BILATERAL  2005/2007  . CHOLECYSTECTOMY    . COLONOSCOPY    . CYSTOCELE REPAIR    . EVACUATION BREAST HEMATOMA Left 08/31/2013   Procedure: EVACUATION HEMATOMA BREAST;  Surgeon: Stark Klein, MD;  Location: [place];  Service: General;  Laterality: Left;  . EYE SURGERY     to repair macular hole  . FOOT ARTHROPLASTY     lt   . GANGLION CYST EXCISION     rt foot  . HEMORRHOID SURGERY     03/1993  . JOINT REPLACEMENT  03/15/11   left knee replacement  . KNEE ARTHROSCOPY     /partial knee 2016/left knee 2012  . MASS EXCISION  11/04/2011   Procedure: EXCISION MASS;  Surgeon: Cammie Sickle., MD;  Location: [place];  Service: Orthopedics;  Laterality: Right;  excisional biopsy right ulna mass  . MASTECTOMY W/ SENTINEL NODE BIOPSY Right 08/30/2013   Procedure: RIGHT  AXILLARY SENTINEL LYMPH NODE BIOPSY; Right Axillary Node Disection;  Surgeon: Stark Klein, MD;  Location: [place];  Service: General;  Laterality: Right;  Right side nuc med 7:00   .  PARTIAL KNEE ARTHROPLASTY Right 11/03/2015   Procedure: RIGHT KNEE MEDIAL UNICOMPARTMENTAL ARTHROPLASTY ;  Surgeon: Gaynelle Arabian, MD;  Location: [facility];  Service: Orthopedics;  Laterality: Right;  . RECTOCELE REPAIR    . SIMPLE MASTECTOMY WITH AXILLARY SENTINEL NODE BIOPSY Left 08/30/2013   Procedure: Bilateral Breast Mastectomy ;  Surgeon: Stark Klein, MD;  Location: [place];  Service: General;  Laterality: Left;  . skin tags removed     breast, panty line, neckline  . TOE SURGERY     preventative crossover toe surg/right foot  . TOE SURGERY  2009   left foot/screw  in 2nd toe  . TONSILLECTOMY    . UPPER GASTROINTESTINAL ENDOSCOPY      There were no vitals filed for this visit.   Subjective Assessment - 01/28/21 2024    Subjective Pt states the back is doing "alright" today. She states she feels that she has improved greatly from her  initial session and is moving around much easier. She still has days where she has back pain but they are more infrequent.    Patient is accompained by: Family member    Pertinent History Breast CA and radiation 2014.    Limitations Sitting;Lifting;Standing;Walking;House hold activities    How long can you sit comfortably? N/A    How long can you stand comfortably? 1 hour    How long can you walk comfortably? 30 min-1 hr.    Patient Stated Goals decreased pain    Currently in Pain? No/denies    Pain Score 0-No pain    Pain Onset More than a month ago              [facility] PT Assessment - 01/28/21 0001      Assessment   Medical Diagnosis Back pain    Referring Provider (PT) [name]    Prior Therapy no      Precautions   Precautions None      Balance Screen   Has the patient fallen in the past 6 months No    Has the patient had a decrease in activity level because of a fear of falling?  No    Is the patient reluctant to leave their home because of a fear of falling?  No      Prior Function   Level of Independence Independent      Cognition   Overall Cognitive Status Within Functional Limits for tasks assessed      Posture/Postural Control   Posture/Postural Control Postural limitations    Postural Limitations Posterior pelvic tilt;Decreased lumbar lordosis      AROM   Lumbar Flexion 60%    Lumbar Extension 70%    Lumbar - Right Side Bend 65%   p!   Lumbar - Left Side Bend 65%   p!   Lumbar - Right Rotation 75%    Lumbar - Left Rotation 75%      Strength   Overall Strength Comments Knees: 5/5 on L 4+/5 on R, Hips: 4+/5      Flexibility   Soft Tissue Assessment /Muscle Length yes    Hamstrings >20 deg limitation supine 90/90      Palpation   Palpation comment hypertonicity of R paraspinals      Transfers   Five time sit to stand comments  9.4s                 Flowsheet Row Outpatient Rehab from 08/24/2018 in [facility]  Impact Scale Total Score 27.94 %            [facility] Adult PT Treatment/Exercise - 01/28/21 0001      Exercises   Exercises Lumbar      Lumbar Exercises: Stretches   Active Hamstring Stretch 3 reps;30 seconds    Other Lumbar Stretch Exercise LTR 3s 10x each      Lumbar Exercises: Standing   Functional Squats Limitations 10lbs 2x10   STS from knee height table     Lumbar Exercises: Seated   Other Seated Lumbar Exercises forward flexion stretch 10s 10x    Other Seated Lumbar Exercises seated pelvic tilting 20x      Lumbar Exercises: Quadruped   Madcat/Old Horse 10 reps    Madcat/Old Horse Limitations 3s    Other Quadruped Lumbar Exercises cat cow 5x (stopped due to wrist)    Other Quadruped Lumbar Exercises child's pose 10s 3x (stopped due to knee)      Manual Therapy   Manual Therapy Joint mobilization;Soft tissue mobilization    Joint Mobilization L2-5 grade III UPA                  PT Education - 01/28/21 2026    Education Details HEP, POC, increased daily physical movement, exercise progression    Person(s) Educated Patient;Spouse    Methods Demonstration;Explanation    Comprehension Verbalized understanding;Returned demonstration            PT Short Term Goals - 01/28/21 2031      PT SHORT TERM GOAL #1   Title Pt to be independent with HEP    Time 2    Period Weeks    Status Achieved    Target Date 12/29/20             PT Long Term Goals - 01/28/21 2031      PT LONG TERM GOAL #1   Title Pt to be independent with final HEP    Time 6    Period Weeks    Status Partially Met      PT LONG TERM GOAL #2   Title Pt to report decreased pain in low back to 0-3/10 with standing and walking activity    Time 6    Period Weeks    Status Achieved      PT LONG TERM GOAL #3   Title Pt to report ability for ambulation for at least 30 min, with pain no greater than 3/10 , to improve ability for IADLS.     Time 6    Period Weeks    Status Partially Met      PT LONG TERM GOAL #4   Title Pt to demo improved lumbar ROM to have only minimal deficit, and no pain, to improve ADLs and IADLS.    Time 6    Period Weeks    Status Partially Met                 Plan - 01/28/21 2027    Clinical Impression Statement Pt demonstrates significant improvement in L/S ROM, LE strength, and mobility since last evaluation. Pt demonstrates improvement in both lumbopelvic strength as well as functional strength with tranfsers. 5xSTS is below cutoff for her age group. Pt does continue to demonstrate ROM restrictions as well as need for motor retraining of core during lifting/resistance activity. Although pt is able to perform loaded compound movements, she still requires VC and TC for form, technique, and back safety. Pt would  benefit from continued skileld therapy in ordre to maximize functional lumbopelvic strength and mobility for safe and pain free daily function.    Personal Factors and Comorbidities Comorbidity 1;Comorbidity 2    Examination-Activity Limitations Locomotion Level;Transfers;Bend;Sit;Squat;Stairs;Stand;Lift    Examination-Participation Restrictions Meal Prep;Cleaning;Community Activity;Shop    Stability/Clinical Decision Making Stable/Uncomplicated    Rehab Potential Good    PT Frequency 2x / week    PT Duration 6 weeks    PT Treatment/Interventions ADLs/Self Care Home Management;Cryotherapy;Electrical Stimulation;Iontophoresis 4mg /ml Dexamethasone;Moist Heat;Traction;Ultrasound;Balance training;Therapeutic exercise;Therapeutic activities;Functional mobility training;Stair training;Gait training;Neuromuscular re-education;Patient/family education;Manual techniques;Passive range of motion;Vasopneumatic Device;Taping;Dry needling;Spinal Manipulations;Joint Manipulations    PT Next Visit Plan STM, joint mob, farmer's carry, hip hinging, review cat cow    PT Home Exercise Plan [email or web address]     Consulted and Agree with Plan of Care Patient           Patient will benefit from skilled therapeutic intervention in order to improve the following deficits and impairments:  Decreased range of motion,Difficulty walking,Decreased activity tolerance,Impaired UE functional use,Increased muscle spasms,Pain,Impaired flexibility,Improper body mechanics,Decreased strength,Decreased mobility  Visit Diagnosis: Chronic bilateral low back pain without sciatica     Problem List Patient Active Problem List   Diagnosis Date Noted  . Genetic testing 08/05/2020  . Fibromyalgia   . MDD (major depressive disorder), recurrent, in full remission ([place]) 12/10/2019  . Pulmonary fibrosis ([place]) 12/04/2018  . MDD (major depressive disorder), recurrent episode, mild ([place]) 12/22/2017  . Osteoporosis 07/14/2017  . OA (osteoarthritis) of knee 11/03/2015  . Osteopenia 08/08/2015  . Headache 02/03/2015  . Atypical chest pain 07/15/2014  . Arthralgia 02/22/2014  . Psoriasis 02/22/2014  . Malignant neoplasm of lower-outer quadrant of right breast of female, estrogen receptor positive ([place]) 08/09/2013  . Neck pain 06/22/2013  . Left knee pain 11/29/2012  . Reactive depression (situational) 04/26/2012  . Right wrist pain 02/02/2012  . Right foot pain 10/04/2011  . Nevus 06/05/2011  . Status post total knee replacement 04/06/2011  . Overactive bladder 03/14/2011  . KNEE PAIN, LEFT 08/31/2010  . HEARING LOSS 08/17/2010  . HIRSUTISM 06/16/2009  . CYST, IDIOPATHIC 05/20/2008  . IRRITABLE BOWEL SYNDROME 04/15/2008  . Allergic asthma, mild intermittent, uncomplicated 86/76/7209  . GERD 03/19/2008  . COLONIC POLYPS, HX OF 03/19/2008  . NEPHROLITHIASIS, HX OF 03/19/2008  . Hypothyroidism 03/18/2008  . Hyperlipidemia 03/18/2008  . INSOMNIA, CHRONIC 03/18/2008  . FIBROMYALGIA 03/18/2008  . Prediabetes 03/18/2008   Daleen Bo PT, DPT 01/28/21 8:33 PM   [facility] [address] [place], [place], [address] Phone: [phone]   Fax:  [phone]  Name: Geral Tuch MRN: [id number] Date of Birth: [birth year]

## 2021-02-04 ENCOUNTER — Ambulatory Visit (INDEPENDENT_AMBULATORY_CARE_PROVIDER_SITE_OTHER): Payer: Medicare HMO | Admitting: Physical Therapy

## 2021-02-04 ENCOUNTER — Other Ambulatory Visit: Payer: Self-pay

## 2021-02-04 ENCOUNTER — Encounter: Payer: Self-pay | Admitting: Physical Therapy

## 2021-02-04 DIAGNOSIS — M545 Low back pain, unspecified: Secondary | ICD-10-CM | POA: Diagnosis not present

## 2021-02-04 DIAGNOSIS — G8929 Other chronic pain: Secondary | ICD-10-CM

## 2021-02-04 NOTE — Patient Instructions (Signed)
Access Code: [email or web address] URL: [email or web address] Date: 02/04/2021 Prepared by: Daleen Bo  Exercises Sit to Stand - 1 x daily - 3-4 x weekly - 2 sets - 10 reps Seated Hamstring Stretch - 2 x daily - 3 reps - 30 hold Standing Anti-Rotation Press with Anchored Resistance - 1 x daily - 3-4 x weekly - 2 sets - 10 reps Seated Pelvic Tilt - 1 x daily - 7 x weekly - 3 sets - 10 reps Supine Posterior Pelvic Tilt - 2 x daily - 2 sets - 10 reps - 3 hold Supine Bridge with Resistance Band - 1 x daily - 3-4 x weekly - 2 sets - 10 reps Supine Lower Trunk Rotation - 2 x daily - 7 x weekly - 3 sets - 10 reps - 3s hold Supine Single Knee to Chest Stretch - 1 x daily - 7 x weekly - 3 sets - 10 reps

## 2021-02-04 NOTE — Therapy (Signed)
[place], [place], [address] Phone: [phone]   Fax:  [phone]  Physical Therapy Treatment  Patient Details  Name: Jean Nash MRN: [id number] Date of Birth: January 10, 1941 Referring Provider (PT): [name]   Encounter Date: 02/04/2021   PT End of Session - 02/04/21 1632    Visit Number 10    Number of Visits 12    Date for PT Re-Evaluation 02/27/21    Authorization Type Aetna Medicare    PT Start Time 8003    PT Stop Time 1435    PT Time Calculation (min) 50 min    Activity Tolerance Patient tolerated treatment well    Behavior During Therapy [facility] for tasks assessed/performed           Past Medical History:  Diagnosis Date  . Adenomatous colon polyp   . Anxiety    PHOBIAS  . Arthritis   . Breast cancer ([place]) 08/08/13   right LOQ  . Cataract   . Chronic insomnia   . Cluster headaches    history of migraines / NONE FOR SEVERAL YRS  . Depression   . Diverticulosis   . Fatty liver 2011  . Fibromyalgia   . GERD (gastroesophageal reflux disease)   . H/O hiatal hernia   . History of transfusion 08/30/2013  . Hx of radiation therapy 10/29/13- 12/14/13   right chest wall 5040 cGy 28 sessions, right supraclavicular/axillary region 5040 cGy 28 sessions, right chest wall boost 1000 cGy 5 sessions  . Hypothyroidism   . Internal hemorrhoids   . Irritable bowel syndrome   . Kidney stone   . Lymphedema    RT ARM - WEARS SLEEVE  . Macular degeneration    hole/right eye  . MDD (major depressive disorder)   . Osteopenia   . Other abnormal glucose   . Other and unspecified hyperlipidemia   . Pain in joint, shoulder region   . Pneumonia 4917,9150  . Sleep apnea    USES C-PAP  . Stress incontinence, female     Past Surgical History:  Procedure Laterality Date  . ABDOMINAL HYSTERECTOMY    . APPENDECTOMY    . BILATERAL TOTAL MASTECTOMY WITH AXILLARY LYMPH NODE DISSECTION  08/30/2013   Dr  Barry Dienes  . BREAST CYST ASPIRATION     9 cysts  . CATARACT EXTRACTION, BILATERAL  2005/2007  . CHOLECYSTECTOMY    . COLONOSCOPY    . CYSTOCELE REPAIR    . EVACUATION BREAST HEMATOMA Left 08/31/2013   Procedure: EVACUATION HEMATOMA BREAST;  Surgeon: Stark Klein, MD;  Location: [facility];  Service: General;  Laterality: Left;  . EYE SURGERY     to repair macular hole  . FOOT ARTHROPLASTY     lt   . GANGLION CYST EXCISION     rt foot  . HEMORRHOID SURGERY     03/1993  . JOINT REPLACEMENT  03/15/11   left knee replacement  . KNEE ARTHROSCOPY     /partial knee 2016/left knee 2012  . MASS EXCISION  11/04/2011   Procedure: EXCISION MASS;  Surgeon: Cammie Sickle., MD;  Location: [facility];  Service: Orthopedics;  Laterality: Right;  excisional biopsy right ulna mass  . MASTECTOMY W/ SENTINEL NODE BIOPSY Right 08/30/2013   Procedure: RIGHT  AXILLARY SENTINEL LYMPH NODE BIOPSY; Right Axillary Node Disection;  Surgeon: Stark Klein, MD;  Location: [place];  Service: General;  Laterality: Right;  Right side nuc med 7:00   .  PARTIAL KNEE ARTHROPLASTY Right 11/03/2015   Procedure: RIGHT KNEE MEDIAL UNICOMPARTMENTAL ARTHROPLASTY ;  Surgeon: Gaynelle Arabian, MD;  [facility];  Service: Orthopedics;  Laterality: Right;  . RECTOCELE REPAIR    . SIMPLE MASTECTOMY WITH AXILLARY SENTINEL NODE BIOPSY Left 08/30/2013   Procedure: Bilateral Breast Mastectomy ;  Surgeon: Stark Klein, MD;  Location: [facility];  Service: General;  Laterality: Left;  . skin tags removed     breast, panty line, neckline  . TOE SURGERY     preventative crossover toe surg/right foot  . TOE SURGERY  2009   left foot/screw  in 2nd toe  . TONSILLECTOMY    . UPPER GASTROINTESTINAL ENDOSCOPY      There were no vitals filed for this visit.   Subjective Assessment - 02/04/21 1347    Subjective Pt reports that her back is doing well. She has had pain with transfers but she has no pain today. She states the new boots  today are making the walking more difficult.    Patient is accompained by: Family member   husband   Pertinent History Breast CA and radiation 2014.    Limitations Sitting;Lifting;Standing;Walking;House hold activities    How long can you sit comfortably? N/A    How long can you stand comfortably? 1 hour    How long can you walk comfortably? 30 min-1 hr.    Patient Stated Goals decreased pain    Currently in Pain? Yes    Pain Score 3     Pain Location Back    Pain Orientation Right;Left    Pain Descriptors / Indicators Aching;Sore;Sharp    Pain Type Chronic pain    Pain Onset More than a month ago    Aggravating Factors  transfers, change of positions    Pain Relieving Factors massage, heat, rest, moving throughout the day    Effect of Pain on Daily Activities limited mobility, ambulation, requires assistance from husband with ADL    Multiple Pain Sites No                     Flowsheet Row [facility]  Lymphedema Life Impact Scale Total Score 27.94 %            [facility] Adult PT Treatment/Exercise - 02/04/21 0001      Posture/Postural Control   Posture/Postural Control Postural limitations    Postural Limitations Posterior pelvic tilt;Decreased lumbar lordosis      Exercises   Exercises Lumbar      Lumbar Exercises: Stretches   Active Hamstring Stretch 3 reps;30 seconds    Single Knee to Chest Stretch 10 seconds;5 reps    Pelvic Tilt 20 reps    Other Lumbar Stretch Exercise LTR 3s 10x each      Lumbar Exercises: Standing   Functional Squats Limitations 10lbs 5x5   STS from knee height table   Other Standing Lumbar Exercises Paloff Press 2x10 double red      Lumbar Exercises: Seated   Other Seated Lumbar Exercises forward flexion stretch 10s 10x    Other Seated Lumbar Exercises seated pelvic tilting 20x      Lumbar Exercises: Supine   Bridge with clamshell 20 reps      Manual  Therapy   Manual Therapy Joint mobilization;Soft tissue mobilization    Joint Mobilization L2-5 grade III UPA                  PT  Education - 02/04/21 1632    Education Details reinforcement of HEP, POC, increased daily physical movement, exercise progression    Person(s) Educated Patient;Spouse    Methods Explanation;Demonstration;Handout;Tactile cues;Verbal cues    Comprehension Returned demonstration;Verbalized understanding            PT Short Term Goals - 01/28/21 2031      PT SHORT TERM GOAL #1   Title Pt to be independent with HEP    Time 2    Period Weeks    Status Achieved    Target Date 12/29/20             PT Long Term Goals - 01/28/21 2031      PT LONG TERM GOAL #1   Title Pt to be independent with final HEP    Time 6    Period Weeks    Status Partially Met      PT LONG TERM GOAL #2   Title Pt to report decreased pain in low back to 0-3/10 with standing and walking activity    Time 6    Period Weeks    Status Achieved      PT LONG TERM GOAL #3   Title Pt to report ability for ambulation for at least 30 min, with pain no greater than 3/10 , to improve ability for IADLS.    Time 6    Period Weeks    Status Partially Met      PT LONG TERM GOAL #4   Title Pt to demo improved lumbar ROM to have only minimal deficit, and no pain, to improve ADLs and IADLS.    Time 6    Period Weeks    Status Partially Met                 Plan - 02/04/21 1634    Clinical Impression Statement Pt demonstrates improvement with STS with at today's session but required increased rest breaks between sets. Pt was able to perform them from a regular chair height but required increased cuing for abdominal contraction and hip extension. Due to pt request, pt's HEP was re-ordered and review in order to promote better compliance and efficiency at home. Pt will require continued edu and cuing for building core strenght and physical activity tolerance. Pt gave verbal  understanding to edu re HEP and need for increased daily movement variability.Pt would benefit from continued skileld therapy in ordre to maximize functional lumbopelvic strength and mobility for safe and pain free daily function.    Personal Factors and Comorbidities Comorbidity 1;Comorbidity 2    Examination-Activity Limitations Locomotion Level;Transfers;Bend;Sit;Squat;Stairs;Stand;Lift    Examination-Participation Restrictions Meal Prep;Cleaning;Community Activity;Shop    Stability/Clinical Decision Making Stable/Uncomplicated    Rehab Potential Good    PT Frequency 2x / week    PT Duration 6 weeks    PT Treatment/Interventions ADLs/Self Care Home Management;Cryotherapy;Electrical Stimulation;Iontophoresis 4mg /ml Dexamethasone;Moist Heat;Traction;Ultrasound;Balance training;Therapeutic exercise;Therapeutic activities;Functional mobility training;Stair training;Gait training;Neuromuscular re-education;Patient/family education;Manual techniques;Passive range of motion;Vasopneumatic Device;Taping;Dry needling;Spinal Manipulations;Joint Manipulations    PT Next Visit Plan STM, joint mob, farmer's carry, hip hinging, review cat cow    PT Home Exercise Plan [email or web address]    Consulted and Agree with Plan of Care Patient           Patient will benefit from skilled therapeutic intervention in order to improve the following deficits and impairments:  Decreased range of motion,Difficulty walking,Decreased activity tolerance,Impaired UE functional use,Increased muscle spasms,Pain,Impaired flexibility,Improper body mechanics,Decreased strength,Decreased mobility  Visit Diagnosis: Chronic bilateral low  back pain without sciatica     Problem List Patient Active Problem List   Diagnosis Date Noted  . Genetic testing 08/05/2020  . Fibromyalgia   . MDD (major depressive disorder), recurrent, in full remission ([place]) 12/10/2019  . Pulmonary fibrosis ([place]) 12/04/2018  . MDD (major depressive disorder),  recurrent episode, mild ([place]) 12/22/2017  . Osteoporosis 07/14/2017  . OA (osteoarthritis) of knee 11/03/2015  . Osteopenia 08/08/2015  . Headache 02/03/2015  . Atypical chest pain 07/15/2014  . Arthralgia 02/22/2014  . Psoriasis 02/22/2014  . Malignant neoplasm of lower-outer quadrant of right breast of female, estrogen receptor positive ([place]) 08/09/2013  . Neck pain 06/22/2013  . Left knee pain 11/29/2012  . Reactive depression (situational) 04/26/2012  . Right wrist pain 02/02/2012  . Right foot pain 10/04/2011  . Nevus 06/05/2011  . Status post total knee replacement 04/06/2011  . Overactive bladder 03/14/2011  . KNEE PAIN, LEFT 08/31/2010  . HEARING LOSS 08/17/2010  . HIRSUTISM 06/16/2009  . CYST, IDIOPATHIC 05/20/2008  . IRRITABLE BOWEL SYNDROME 04/15/2008  . Allergic asthma, mild intermittent, uncomplicated 35/36/1443  . GERD 03/19/2008  . COLONIC POLYPS, HX OF 03/19/2008  . NEPHROLITHIASIS, HX OF 03/19/2008  . Hypothyroidism 03/18/2008  . Hyperlipidemia 03/18/2008  . INSOMNIA, CHRONIC 03/18/2008  . FIBROMYALGIA 03/18/2008  . Prediabetes 03/18/2008    Daleen Bo PT, DPT 02/04/21 4:42 PM   [facility] [address] [place], [place], [address] Phone: [phone]   Fax:  [phone]  Name: Jean Nash MRN: [id number] Date of Birth: 05-Apr-1941

## 2021-02-11 ENCOUNTER — Ambulatory Visit (INDEPENDENT_AMBULATORY_CARE_PROVIDER_SITE_OTHER): Payer: Medicare HMO | Admitting: Physical Therapy

## 2021-02-11 ENCOUNTER — Other Ambulatory Visit: Payer: Self-pay

## 2021-02-11 ENCOUNTER — Encounter: Payer: Self-pay | Admitting: Physical Therapy

## 2021-02-11 DIAGNOSIS — M6281 Muscle weakness (generalized): Secondary | ICD-10-CM | POA: Diagnosis not present

## 2021-02-11 DIAGNOSIS — M545 Low back pain, unspecified: Secondary | ICD-10-CM | POA: Diagnosis not present

## 2021-02-11 DIAGNOSIS — M79601 Pain in right arm: Secondary | ICD-10-CM | POA: Diagnosis not present

## 2021-02-11 NOTE — Therapy (Deleted)
[facility] [address] [place], [place], [address] Phone: [phone]   Fax:  [phone]  Physical Therapy Treatment  Patient Details  Name: Jean Nash MRN: [id number] Date of Birth: Sep 11, 1941 Referring Provider (PT): [name]   Encounter Date: 02/11/2021   PT End of Session - 02/11/21 1841    Visit Number 11    Number of Visits 12    Date for PT Re-Evaluation 03/13/21    Authorization Type [employer]    PT Start Time 5681    PT Stop Time 1430    PT Time Calculation (min) 45 min    Activity Tolerance Patient tolerated treatment well    Behavior During Therapy [facility] for tasks assessed/performed           Past Medical History:  Diagnosis Date  . Adenomatous colon polyp   . Anxiety    PHOBIAS  . Arthritis   . Breast cancer ([place]) 08/08/13   right LOQ  . Cataract   . Chronic insomnia   . Cluster headaches    history of migraines / NONE FOR SEVERAL YRS  . Depression   . Diverticulosis   . Fatty liver 2011  . Fibromyalgia   . GERD (gastroesophageal reflux disease)   . H/O hiatal hernia   . History of transfusion 08/30/2013  . Hx of radiation therapy 10/29/13- 12/14/13   right chest wall 5040 cGy 28 sessions, right supraclavicular/axillary region 5040 cGy 28 sessions, right chest wall boost 1000 cGy 5 sessions  . Hypothyroidism   . Internal hemorrhoids   . Irritable bowel syndrome   . Kidney stone   . Lymphedema    RT ARM - WEARS SLEEVE  . Macular degeneration    hole/right eye  . MDD (major depressive disorder)   . Osteopenia   . Other abnormal glucose   . Other and unspecified hyperlipidemia   . Pain in joint, shoulder region   . Pneumonia 2751,7001  . Sleep apnea    USES C-PAP  . Stress incontinence, female     Past Surgical History:  Procedure Laterality Date  . ABDOMINAL HYSTERECTOMY    . APPENDECTOMY    . BILATERAL TOTAL MASTECTOMY WITH AXILLARY LYMPH NODE DISSECTION  08/30/2013   Dr  Barry Dienes  . BREAST CYST ASPIRATION     9 cysts  . CATARACT EXTRACTION, BILATERAL  2005/2007  . CHOLECYSTECTOMY    . COLONOSCOPY    . CYSTOCELE REPAIR    . EVACUATION BREAST HEMATOMA Left 08/31/2013   Procedure: EVACUATION HEMATOMA BREAST;  Surgeon: Stark Klein, MD;  Location: [place];  Service: General;  Laterality: Left;  . EYE SURGERY     to repair macular hole  . FOOT ARTHROPLASTY     lt   . GANGLION CYST EXCISION     rt foot  . HEMORRHOID SURGERY     03/1993  . JOINT REPLACEMENT  03/15/11   left knee replacement  . KNEE ARTHROSCOPY     /partial knee 2016/left knee 2012  . MASS EXCISION  11/04/2011   Procedure: EXCISION MASS;  Surgeon: Cammie Sickle., MD;  Location: [facility];  Service: Orthopedics;  Laterality: Right;  excisional biopsy right ulna mass  . MASTECTOMY W/ SENTINEL NODE BIOPSY Right 08/30/2013   Procedure: RIGHT  AXILLARY SENTINEL LYMPH NODE BIOPSY; Right Axillary Node Disection;  Surgeon: Stark Klein, MD;  Location: [place];  Service: General;  Laterality: Right;  Right side nuc med 7:00   .  PARTIAL KNEE ARTHROPLASTY Right 11/03/2015   Procedure: RIGHT KNEE MEDIAL UNICOMPARTMENTAL ARTHROPLASTY ;  Surgeon: Gaynelle Arabian, MD;  Location: [facility];  Service: Orthopedics;  Laterality: Right;  . RECTOCELE REPAIR    . SIMPLE MASTECTOMY WITH AXILLARY SENTINEL NODE BIOPSY Left 08/30/2013   Procedure: Bilateral Breast Mastectomy ;  Surgeon: Stark Klein, MD;  Location: [place];  Service: General;  Laterality: Left;  . skin tags removed     breast, panty line, neckline  . TOE SURGERY     preventative crossover toe surg/right foot  . TOE SURGERY  2009   left foot/screw  in 2nd toe  . TONSILLECTOMY    . UPPER GASTROINTESTINAL ENDOSCOPY      There were no vitals filed for this visit.   Subjective Assessment - 02/11/21 1353    Subjective Pt states that the back rib and underarm will occur with cross body reaching. She has pain into the "blob" under the arm  that feelings like "something is ripping." She states that that bending and twisting will recreate the pain. Pt denies NT. Pt reports it does not feel connected to low back. Pain will last about 20-30 mins. She states the pain will take away her breathe. She had trouble wiping due to pain for several years.  She has to rely on the bidet at this point for hygiene. Especially with her UTI, she goes to the bathroom constantly. Aggs: reaching twisting lifting wiping; Eases: resting, arm by side, heat. Current 0/10 Worst 10/10 Best 0/10.    Patient is accompained by: Family member   husband   Pertinent History Breast CA and radiation 2014.    Limitations Sitting;Lifting;Standing;Walking;House hold activities    How long can you sit comfortably? N/A    How long can you stand comfortably? 1 hour    How long can you walk comfortably? 30 min-1 hr.    Patient Stated Goals decreased pain    Currently in Pain? No/denies    Pain Score 0-No pain    Pain Onset More than a month ago              [facility] PT Assessment - 02/11/21 0001      Assessment   Medical Diagnosis Back pain and R underarm pain    Referring Provider (PT) [name]    Prior Therapy no      Precautions   Precautions None      Balance Screen   Has the patient fallen in the past 6 months No    Has the patient had a decrease in activity level because of a fear of falling?  No    Is the patient reluctant to leave their home because of a fear of falling?  No      Prior Function   Level of Independence Independent      Cognition   Overall Cognitive Status Within Functional Limits for tasks assessed      Observation/Other Assessments   Other Surveys  Quick Dash    Quick DASH  34.1%      Posture/Postural Control   Posture/Postural Control Postural limitations    Postural Limitations Posterior pelvic tilt;Decreased lumbar lordosis      AROM   Overall AROM Comments R shoulder: IR T10 ER C3    AROM Assessment Site  Thoracic;Shoulder    Right/Left Shoulder Right    Right Shoulder Flexion 160 Degrees    Right Shoulder ABduction 156 Degrees    Thoracic Flexion 50%  Thoracic Extension 65%    Thoracic - Right Rotation 85%    Thoracic - Left Rotation 75%      Strength   Overall Strength Comments R shoulder 4/5 throughout; L 4+/5 throughout      Palpation   Palpation comment TTP along costal cartilage border on R at T7 and below; excess skin and soft tissue under arm tender to palpation, decreased skin mobility with pin and stretch                 Flowsheet Row [facility]  Lymphedema Life Impact Scale Total Score 27.94 %            [facility] Adult PT Treatment/Exercise - 02/11/21 0001      Exercises   Exercises Shoulder      Shoulder Exercises: Standing   External Rotation Limitations 2x10 YTB bilat    Row 20 reps    Theraband Level (Shoulder Row) Level 2 (Red)      Shoulder Exercises: Stretch   Other Shoulder Stretches wall scaption slide 10s 10x      Manual Therapy   Manual Therapy Soft tissue mobilization    Soft tissue mobilization scar tissue mobilization along R underarm and costal cartilage border, gentle broad pressure                  PT Education - 02/11/21 1836    Education Details reinforcement of HEP, POC, increased daily physical movement, exercise progression, anatomy    Person(s) Educated Patient;Spouse    Methods Explanation;Demonstration;Tactile cues;Verbal cues    Comprehension Verbalized understanding;Returned demonstration            PT Short Term Goals - 01/28/21 2031      PT SHORT TERM GOAL #1   Title Pt to be independent with HEP    Time 2    Period Weeks    Status Achieved    Target Date 12/29/20             PT Long Term Goals - 01/28/21 2031      PT LONG TERM GOAL #1   Title Pt to be independent with final HEP    Time 6    Period Weeks    Status  Partially Met      PT LONG TERM GOAL #2   Title Pt to report decreased pain in low back to 0-3/10 with standing and walking activity    Time 6    Period Weeks    Status Achieved      PT LONG TERM GOAL #3   Title Pt to report ability for ambulation for at least 30 min, with pain no greater than 3/10 , to improve ability for IADLS.    Time 6    Period Weeks    Status Partially Met      PT LONG TERM GOAL #4   Title Pt to demo improved lumbar ROM to have only minimal deficit, and no pain, to improve ADLs and IADLS.    Time 6    Period Weeks    Status Partially Met                 Plan - 02/11/21 1841    Clinical Impression Statement Pt has had decreased LBP since previous session. Today's session was used to evaluate pt's complaint of intense R underarm pain. Pt presents with decreased R UE strength, decreased soft tissue/skin mobility, and pain with R UE  reaching/lifting. Pt's s/s appear consistent with history of scar tissue following mastectomy. Pt would benefit from continued skileld therapy in ordre to maximize functional lumbopelvic strength as well as R UE strength,  function, and mobility for safe and pain free daily function.    Personal Factors and Comorbidities Comorbidity 1;Comorbidity 2    Examination-Activity Limitations Locomotion Level;Transfers;Bend;Sit;Squat;Stairs;Stand;Lift    Examination-Participation Restrictions Meal Prep;Cleaning;Community Activity;Shop    Stability/Clinical Decision Making Stable/Uncomplicated    Rehab Potential Good    PT Frequency 2x / week    PT Duration 6 weeks    PT Treatment/Interventions ADLs/Self Care Home Management;Cryotherapy;Electrical Stimulation;Iontophoresis 39m/ml Dexamethasone;Moist Heat;Traction;Ultrasound;Balance training;Therapeutic exercise;Therapeutic activities;Functional mobility training;Stair training;Gait training;Neuromuscular re-education;Patient/family education;Manual techniques;Passive range of  motion;Vasopneumatic Device;Taping;Dry needling;Spinal Manipulations;Joint Manipulations    PT Next Visit Plan STM, joint mob, hip hinge review, AAROM cane scaption/ flexion, T/S rotation, T/S extension    PT Home Exercise Plan [email or web address]    Consulted and Agree with Plan of Care Patient           Patient will benefit from skilled therapeutic intervention in order to improve the following deficits and impairments:  Decreased range of motion,Difficulty walking,Decreased activity tolerance,Impaired UE functional use,Increased muscle spasms,Pain,Impaired flexibility,Improper body mechanics,Decreased strength,Decreased mobility  Visit Diagnosis: Pain, lumbar region  Muscle weakness (generalized)  Pain in right arm     Problem List Patient Active Problem List   Diagnosis Date Noted  . Genetic testing 08/05/2020  . Fibromyalgia   . MDD (major depressive disorder), recurrent, in full remission ([place] 12/10/2019  . Pulmonary fibrosis ([facility] 12/04/2018  . MDD (major depressive disorder), recurrent episode, mild ([facility] 12/22/2017  . Osteoporosis 07/14/2017  . OA (osteoarthritis) of knee 11/03/2015  . Osteopenia 08/08/2015  . Headache 02/03/2015  . Atypical chest pain 07/15/2014  . Arthralgia 02/22/2014  . Psoriasis 02/22/2014  . Malignant neoplasm of lower-outer quadrant of right breast of female, estrogen receptor positive ([facility] 08/09/2013  . Neck pain 06/22/2013  . Left knee pain 11/29/2012  . Reactive depression (situational) 04/26/2012  . Right wrist pain 02/02/2012  . Right foot pain 10/04/2011  . Nevus 06/05/2011  . Status post total knee replacement 04/06/2011  . Overactive bladder 03/14/2011  . KNEE PAIN, LEFT 08/31/2010  . HEARING LOSS 08/17/2010  . HIRSUTISM 06/16/2009  . CYST, IDIOPATHIC 05/20/2008  . IRRITABLE BOWEL SYNDROME 04/15/2008  . Allergic asthma, mild intermittent, uncomplicated 072/07/4708 . GERD 03/19/2008  . COLONIC POLYPS, HX OF 03/19/2008  .  NEPHROLITHIASIS, HX OF 03/19/2008  . Hypothyroidism 03/18/2008  . Hyperlipidemia 03/18/2008  . INSOMNIA, CHRONIC 03/18/2008  . FIBROMYALGIA 03/18/2008  . Prediabetes 03/18/2008    ADaleen Bo3/30/2022, 7:00 PM  [place] [address]Phone: [phone]  Fax:  [phone] Name: Jean SlabyMRN: [id number] of Birth: 503/15/1942

## 2021-02-11 NOTE — Therapy (Signed)
[facility] [address] [place], [place], [address] Phone: [phone]   Fax:  [phone]  Physical Therapy Treatment/Re-Eval  Patient Details  Name: Jean Nash MRN: [id number] Date of Birth: Nov 08, 1941 Referring Provider (PT): [name]   Encounter Date: 02/11/2021   PT End of Session - 02/11/21 1841    Visit Number 11    Number of Visits 20    Date for PT Re-Evaluation 03/13/21    Authorization Type [employer] Medicare    PT Start Time 1740    PT Stop Time 1430    PT Time Calculation (min) 45 min    Activity Tolerance Patient tolerated treatment well    Behavior During Therapy [facility] for tasks assessed/performed           Past Medical History:  Diagnosis Date  . Adenomatous colon polyp   . Anxiety    PHOBIAS  . Arthritis   . Breast cancer ([place]) 08/08/13   right LOQ  . Cataract   . Chronic insomnia   . Cluster headaches    history of migraines / NONE FOR SEVERAL YRS  . Depression   . Diverticulosis   . Fatty liver 2011  . Fibromyalgia   . GERD (gastroesophageal reflux disease)   . H/O hiatal hernia   . History of transfusion 08/30/2013  . Hx of radiation therapy 10/29/13- 12/14/13   right chest wall 5040 cGy 28 sessions, right supraclavicular/axillary region 5040 cGy 28 sessions, right chest wall boost 1000 cGy 5 sessions  . Hypothyroidism   . Internal hemorrhoids   . Irritable bowel syndrome   . Kidney stone   . Lymphedema    RT ARM - WEARS SLEEVE  . Macular degeneration    hole/right eye  . MDD (major depressive disorder)   . Osteopenia   . Other abnormal glucose   . Other and unspecified hyperlipidemia   . Pain in joint, shoulder region   . Pneumonia 8144,8185  . Sleep apnea    USES C-PAP  . Stress incontinence, female     Past Surgical History:  Procedure Laterality Date  . ABDOMINAL HYSTERECTOMY    . APPENDECTOMY    . BILATERAL TOTAL MASTECTOMY WITH AXILLARY LYMPH NODE DISSECTION  08/30/2013    Dr Barry Dienes  . BREAST CYST ASPIRATION     9 cysts  . CATARACT EXTRACTION, BILATERAL  2005/2007  . CHOLECYSTECTOMY    . COLONOSCOPY    . CYSTOCELE REPAIR    . EVACUATION BREAST HEMATOMA Left 08/31/2013   Procedure: EVACUATION HEMATOMA BREAST;  Surgeon: Stark Klein, MD;  Location: [facility];  Service: General;  Laterality: Left;  . EYE SURGERY     to repair macular hole  . FOOT ARTHROPLASTY     lt   . GANGLION CYST EXCISION     rt foot  . HEMORRHOID SURGERY     03/1993  . JOINT REPLACEMENT  03/15/11   left knee replacement  . KNEE ARTHROSCOPY     /partial knee 2016/left knee 2012  . MASS EXCISION  11/04/2011   Procedure: EXCISION MASS;  Surgeon: Cammie Sickle., MD;  Location: [facility];  Service: Orthopedics;  Laterality: Right;  excisional biopsy right ulna mass  . MASTECTOMY W/ SENTINEL NODE BIOPSY Right 08/30/2013   Procedure: RIGHT  AXILLARY SENTINEL LYMPH NODE BIOPSY; Right Axillary Node Disection;  Surgeon: Stark Klein, MD;  Location: [facility];  Service: General;  Laterality: Right;  Right side nuc med 7:00   .  PARTIAL KNEE ARTHROPLASTY Right 11/03/2015   Procedure: RIGHT KNEE MEDIAL UNICOMPARTMENTAL ARTHROPLASTY ;  Surgeon: Gaynelle Arabian, MD;  Location: [facility];  Service: Orthopedics;  Laterality: Right;  . RECTOCELE REPAIR    . SIMPLE MASTECTOMY WITH AXILLARY SENTINEL NODE BIOPSY Left 08/30/2013   Procedure: Bilateral Breast Mastectomy ;  Surgeon: Stark Klein, MD;  Location: [facility];  Service: General;  Laterality: Left;  . skin tags removed     breast, panty line, neckline  . TOE SURGERY     preventative crossover toe surg/right foot  . TOE SURGERY  2009   left foot/screw  in 2nd toe  . TONSILLECTOMY    . UPPER GASTROINTESTINAL ENDOSCOPY      There were no vitals filed for this visit.   Subjective Assessment - 02/11/21 1353    Subjective Pt states that the back rib and underarm will occur with cross body reaching. She has pain into the "blob" under the arm  that feelings like "something is ripping." She states that that bending and twisting will recreate the pain. Pt denies NT. Pt reports it does not feel connected to low back. Pain will last about 20-30 mins. She states the pain will take away her breathe. She had trouble wiping due to pain for several years.  She has to rely on the bidet at this point for hygiene. Especially with her UTI, she goes to the bathroom constantly. Aggs: reaching twisting lifting wiping; Eases: resting, arm by side, heat. Current 0/10 Worst 10/10 Best 0/10.    Patient is accompained by: Family member   husband   Pertinent History Breast CA and radiation 2014.    Limitations Sitting;Lifting;Standing;Walking;House hold activities    How long can you sit comfortably? N/A    How long can you stand comfortably? 1 hour    How long can you walk comfortably? 30 min-1 hr.    Patient Stated Goals decreased pain    Currently in Pain? No/denies    Pain Score 0-No pain    Pain Onset More than a month ago              [facility] PT Assessment - 02/11/21 0001      Assessment   Medical Diagnosis Back pain and R underarm pain    Referring Provider (PT) [name]    Prior Therapy no      Precautions   Precautions None      Balance Screen   Has the patient fallen in the past 6 months No    Has the patient had a decrease in activity level because of a fear of falling?  No    Is the patient reluctant to leave their home because of a fear of falling?  No      Prior Function   Level of Independence Independent      Cognition   Overall Cognitive Status Within Functional Limits for tasks assessed      Observation/Other Assessments   Other Surveys  Quick Dash    Quick DASH  34.1%      Posture/Postural Control   Posture/Postural Control Postural limitations    Postural Limitations Posterior pelvic tilt;Decreased lumbar lordosis      AROM   Overall AROM Comments R shoulder: IR T10 ER C3    AROM Assessment Site  Thoracic;Shoulder    Right/Left Shoulder Right    Right Shoulder Flexion 160 Degrees    Right Shoulder ABduction 156 Degrees    Thoracic Flexion 50%  Thoracic Extension 65%    Thoracic - Right Rotation 85%    Thoracic - Left Rotation 75%      Strength   Overall Strength Comments R shoulder 4/5 throughout; L 4+/5 throughout      Palpation   Palpation comment TTP along costal cartilage border on R at T7 and below; excess skin and soft tissue under arm tender to palpation, decreased skin mobility with pin and stretch                 Flowsheet Row [facility]  Lymphedema Life Impact Scale Total Score 27.94 %            [facility] Adult PT Treatment/Exercise - 02/11/21 0001      Exercises   Exercises Shoulder      Shoulder Exercises: Standing   External Rotation Limitations 2x10 YTB bilat    Row 20 reps    Theraband Level (Shoulder Row) Level 2 (Red)      Shoulder Exercises: Stretch   Other Shoulder Stretches wall scaption slide 10s 10x      Manual Therapy   Manual Therapy Soft tissue mobilization    Soft tissue mobilization scar tissue mobilization along R underarm and costal cartilage border, gentle broad pressure                  PT Education - 02/11/21 1836    Education Details reinforcement of HEP, POC, increased daily physical movement, exercise progression, anatomy    Person(s) Educated Patient;Spouse    Methods Explanation;Demonstration;Tactile cues;Verbal cues    Comprehension Verbalized understanding;Returned demonstration            PT Short Term Goals - 02/11/21 1903      PT SHORT TERM GOAL #1   Title Pt to be independent with HEP for LBP and R underarm pain.    Time 2    Period Weeks    Status New    Target Date 12/29/20             PT Long Term Goals - 02/11/21 1903      PT LONG TERM GOAL #1   Title Pt to be independent with final HEP for LBP and R  underarm pain.    Time 8    Period Weeks    Status New      PT LONG TERM GOAL #2   Title Pt will be able to demonstrate full thoracic motion with crossbody reaching without pain in order to demonstrate functional improvement in pain and ROM.    Time 6    Period Weeks    Status New      PT LONG TERM GOAL #3   Title Pt to report ability for ambulation for at least 30 min, with pain no greater than 3/10 , to improve ability for IADLS.    Time 6    Period Weeks    Status Partially Met      PT LONG TERM GOAL #4   Title Pt to demo improved lumbar ROM to have only minimal deficit, and no pain, to improve ADLs and IADLS.    Time 6    Period Weeks    Status Partially Met      PT LONG TERM GOAL #5   Title Pt will be able to demonstrate self hygiene simulated motions without pain in order to demonstrate functional improvement in T/S and R UE function for independence and pain free ADL  and IADL.    Time 8    Period Weeks    Status New      Additional Long Term Goals   Additional Long Term Goals Yes                 Plan - 02/11/21 1841    Clinical Impression Statement Pt has had decreased LBP since previous session. Today's session was used to evaluate pt's complaint of intense R underarm pain. Pt presents with decreased R UE strength, decreased soft tissue/skin mobility, and pain with R UE reaching/lifting. Pt's s/s appear consistent with history of scar tissue following mastectomy. Pt would benefit from continued skileld therapy in ordre to maximize functional lumbopelvic strength as well as R UE strength,  function, and mobility for safe and pain free daily function.    Personal Factors and Comorbidities Comorbidity 1;Comorbidity 2    Examination-Activity Limitations Locomotion Level;Transfers;Bend;Sit;Squat;Stairs;Stand;Lift    Examination-Participation Restrictions Meal Prep;Cleaning;Community Activity;Shop    Stability/Clinical Decision Making Stable/Uncomplicated    Rehab  Potential Good    PT Frequency 2x / week    PT Duration 6 weeks    PT Treatment/Interventions ADLs/Self Care Home Management;Cryotherapy;Electrical Stimulation;Iontophoresis 4mg /ml Dexamethasone;Moist Heat;Traction;Ultrasound;Balance training;Therapeutic exercise;Therapeutic activities;Functional mobility training;Stair training;Gait training;Neuromuscular re-education;Patient/family education;Manual techniques;Passive range of motion;Vasopneumatic Device;Taping;Dry needling;Spinal Manipulations;Joint Manipulations    PT Next Visit Plan STM, joint mob, hip hinge review, AAROM cane scaption/ flexion, T/S rotation, T/S extension    PT Home Exercise Plan [email or web address]    Consulted and Agree with Plan of Care Patient           Patient will benefit from skilled therapeutic intervention in order to improve the following deficits and impairments:  Decreased range of motion,Difficulty walking,Decreased activity tolerance,Impaired UE functional use,Increased muscle spasms,Pain,Impaired flexibility,Improper body mechanics,Decreased strength,Decreased mobility  Visit Diagnosis: Pain, lumbar region - Plan: PT plan of care cert/re-cert  Muscle weakness (generalized) - Plan: PT plan of care cert/re-cert  Pain in right arm - Plan: PT plan of care cert/re-cert     Problem List Patient Active Problem List   Diagnosis Date Noted  . Genetic testing 08/05/2020  . Fibromyalgia   . MDD (major depressive disorder), recurrent, in full remission ([place]) 12/10/2019  . Pulmonary fibrosis ([place]) 12/04/2018  . MDD (major depressive disorder), recurrent episode, mild ([place]) 12/22/2017  . Osteoporosis 07/14/2017  . OA (osteoarthritis) of knee 11/03/2015  . Osteopenia 08/08/2015  . Headache 02/03/2015  . Atypical chest pain 07/15/2014  . Arthralgia 02/22/2014  . Psoriasis 02/22/2014  . Malignant neoplasm of lower-outer quadrant of right breast of female, estrogen receptor positive ([place]) 08/09/2013  . Neck pain  06/22/2013  . Left knee pain 11/29/2012  . Reactive depression (situational) 04/26/2012  . Right wrist pain 02/02/2012  . Right foot pain 10/04/2011  . Nevus 06/05/2011  . Status post total knee replacement 04/06/2011  . Overactive bladder 03/14/2011  . KNEE PAIN, LEFT 08/31/2010  . HEARING LOSS 08/17/2010  . HIRSUTISM 06/16/2009  . CYST, IDIOPATHIC 05/20/2008  . IRRITABLE BOWEL SYNDROME 04/15/2008  . Allergic asthma, mild intermittent, uncomplicated 34/19/3790  . GERD 03/19/2008  . COLONIC POLYPS, HX OF 03/19/2008  . NEPHROLITHIASIS, HX OF 03/19/2008  . Hypothyroidism 03/18/2008  . Hyperlipidemia 03/18/2008  . INSOMNIA, CHRONIC 03/18/2008  . FIBROMYALGIA 03/18/2008  . Prediabetes 03/18/2008   Daleen Bo PT, DPT 02/11/21 7:12 PM   [facility] [address] [place], [place], [address] Phone: [phone]   Fax:  [phone]  Name: Jean Nash  MRN: [id number] Date of Birth: 02/09/1941

## 2021-02-11 NOTE — Patient Instructions (Signed)
Access Code: [email or web address] URL: [email or web address] Date: 02/11/2021 Prepared by: Daleen Bo  Exercises Shoulder External Rotation and Scapular Retraction with Resistance - 1 x daily - 3 x weekly - 3 sets - 10 reps Standing Shoulder Row with Anchored Resistance - 1 x daily - 3 x weekly - 3 sets - 10 reps Scaption Wall Slide with Towel - 1 x daily - 3 x weekly - 1 sets - 10 reps - 10 hold

## 2021-02-16 ENCOUNTER — Other Ambulatory Visit: Payer: Self-pay | Admitting: Adult Health

## 2021-02-16 DIAGNOSIS — Z8669 Personal history of other diseases of the nervous system and sense organs: Secondary | ICD-10-CM

## 2021-02-18 ENCOUNTER — Other Ambulatory Visit: Payer: Self-pay

## 2021-02-18 ENCOUNTER — Ambulatory Visit (INDEPENDENT_AMBULATORY_CARE_PROVIDER_SITE_OTHER): Payer: Medicare HMO | Admitting: Physical Therapy

## 2021-02-18 ENCOUNTER — Encounter: Payer: Self-pay | Admitting: Physical Therapy

## 2021-02-18 DIAGNOSIS — M79601 Pain in right arm: Secondary | ICD-10-CM | POA: Diagnosis not present

## 2021-02-18 DIAGNOSIS — M545 Low back pain, unspecified: Secondary | ICD-10-CM

## 2021-02-18 DIAGNOSIS — M6281 Muscle weakness (generalized): Secondary | ICD-10-CM

## 2021-02-18 NOTE — Therapy (Signed)
[facility] [address] [place], [place], [address] Phone: [phone]   Fax:  [phone]  Physical Therapy Treatment  Patient Details  Name: Jean Nash MRN: [id number] Date of Birth: [birth year] Referring Provider (PT): [name]   Encounter Date: 02/18/2021   PT End of Session - 02/18/21 1342    Visit Number 12    Number of Visits 20    Date for PT Re-Evaluation 03/13/21    Authorization Type Aetna Medicare    PT Start Time 9702    PT Stop Time 1430    PT Time Calculation (min) 45 min    Activity Tolerance Patient tolerated treatment well    Behavior During Therapy [facility] for tasks assessed/performed           Past Medical History:  Diagnosis Date  . Adenomatous colon polyp   . Anxiety    PHOBIAS  . Arthritis   . Breast cancer ([place]) 08/08/13   right LOQ  . Cataract   . Chronic insomnia   . Cluster headaches    history of migraines / NONE FOR SEVERAL YRS  . Depression   . Diverticulosis   . Fatty liver 2011  . Fibromyalgia   . GERD (gastroesophageal reflux disease)   . H/O hiatal hernia   . History of transfusion 08/30/2013  . Hx of radiation therapy 10/29/13- 12/14/13   right chest wall 5040 cGy 28 sessions, right supraclavicular/axillary region 5040 cGy 28 sessions, right chest wall boost 1000 cGy 5 sessions  . Hypothyroidism   . Internal hemorrhoids   . Irritable bowel syndrome   . Kidney stone   . Lymphedema    RT ARM - WEARS SLEEVE  . Macular degeneration    hole/right eye  . MDD (major depressive disorder)   . Osteopenia   . Other abnormal glucose   . Other and unspecified hyperlipidemia   . Pain in joint, shoulder region   . Pneumonia [id number],5885  . Sleep apnea    USES C-PAP  . Stress incontinence, female     Past Surgical History:  Procedure Laterality Date  . ABDOMINAL HYSTERECTOMY    . APPENDECTOMY    . BILATERAL TOTAL MASTECTOMY WITH AXILLARY LYMPH NODE DISSECTION  08/30/2013   Dr  Barry Dienes  . BREAST CYST ASPIRATION     9 cysts  . CATARACT EXTRACTION, BILATERAL  2005/2007  . CHOLECYSTECTOMY    . COLONOSCOPY    . CYSTOCELE REPAIR    . EVACUATION BREAST HEMATOMA Left 08/31/2013   Procedure: EVACUATION HEMATOMA BREAST;  Surgeon: Stark Klein, MD;  Location: [facility];  Service: General;  Laterality: Left;  . EYE SURGERY     to repair macular hole  . FOOT ARTHROPLASTY     lt   . GANGLION CYST EXCISION     rt foot  . HEMORRHOID SURGERY     03/1993  . JOINT REPLACEMENT  03/15/11   left knee replacement  . KNEE ARTHROSCOPY     /partial knee 2016/left knee 2012  . MASS EXCISION  11/04/2011   Procedure: EXCISION MASS;  Surgeon: Cammie Sickle., MD;  Location: [facility];  Service: Orthopedics;  Laterality: Right;  excisional biopsy right ulna mass  . MASTECTOMY W/ SENTINEL NODE BIOPSY Right 08/30/2013   Procedure: RIGHT  AXILLARY SENTINEL LYMPH NODE BIOPSY; Right Axillary Node Disection;  Surgeon: Stark Klein, MD;  Location: [facility];  Service: General;  Laterality: Right;  Right side nuc med 7:00   .  PARTIAL KNEE ARTHROPLASTY Right 11/03/2015   Procedure: RIGHT KNEE MEDIAL UNICOMPARTMENTAL ARTHROPLASTY ;  Surgeon: Gaynelle Arabian, MD;  [facility];  Service: Orthopedics;  Laterality: Right;  . RECTOCELE REPAIR    . SIMPLE MASTECTOMY WITH AXILLARY SENTINEL NODE BIOPSY Left 08/30/2013   Procedure: Bilateral Breast Mastectomy ;  Surgeon: Stark Klein, MD;  Location: [facility];  Service: General;  Laterality: Left;  . skin tags removed     breast, panty line, neckline  . TOE SURGERY     preventative crossover toe surg/right foot  . TOE SURGERY  2009   left foot/screw  in 2nd toe  . TONSILLECTOMY    . UPPER GASTROINTESTINAL ENDOSCOPY      There were no vitals filed for this visit.   Subjective Assessment - 02/18/21 1342    Subjective Pt states that she feels that the HEP has helped with the pain. She states she did have an under arm shooting pain that was  more intense than before. She did have decreased intensity and frequency of pain under the arm and into the ribs overall. She states her low back is doing much better as well.    Patient is accompained by: Family member   husband   Pertinent History Breast CA and radiation 2014.    Limitations Sitting;Lifting;Standing;Walking;House hold activities    How long can you sit comfortably? N/A    How long can you stand comfortably? 1 hour    How long can you walk comfortably? 30 min-1 hr.    Patient Stated Goals decreased pain    Currently in Pain? No/denies    Pain Score 0-No pain    Pain Onset More than a month ago                     Flowsheet Row Outpatient Rehab from 08/24/2018 in [facility]  Lymphedema Life Impact Scale Total Score 27.94 %            [facility] Adult PT Treatment/Exercise - 02/18/21 0001      Posture/Postural Control   Posture/Postural Control Postural limitations    Postural Limitations Posterior pelvic tilt;Decreased lumbar lordosis      Exercises   Exercises Shoulder      Shoulder Exercises: Supine   Protraction Limitations 2x10      Shoulder Exercises: Sidelying   Other Sidelying Exercises open book stretch 10x 3s hold      Shoulder Exercises: Standing   External Rotation Limitations --    Row 20 reps    Theraband Level (Shoulder Row) Level 3 (Green)    Other Standing Exercises AAROM cane ABD 10x    Other Standing Exercises standing shoulder extension red TB 20x      Shoulder Exercises: Stretch   Other Shoulder Stretches wall scaption slide 10s 10x    Other Shoulder Stretches corner pec stretch 30s 3x      Manual Therapy   Manual Therapy Soft tissue mobilization    Soft tissue mobilization scar tissue mobilization along R underarm and costal cartilage border, gentle broad pressure                  PT Education - 02/18/21 1432    Education Details HEP, POC, increased daily physical movement,  exercise progression, anatomy, scar tissue remodeling    Person(s) Educated Patient;Spouse    Methods Explanation;Handout;Verbal cues;Tactile cues;Demonstration    Comprehension Verbalized understanding;Returned demonstration  PT Short Term Goals - 02/11/21 1903      PT SHORT TERM GOAL #1   Title Pt to be independent with HEP for LBP and R underarm pain.    Time 2    Period Weeks    Status New    Target Date 12/29/20             PT Long Term Goals - 02/11/21 1903      PT LONG TERM GOAL #1   Title Pt to be independent with final HEP for LBP and R underarm pain.    Time 8    Period Weeks    Status New      PT LONG TERM GOAL #2   Title Pt will be able to demonstrate full thoracic motion with crossbody reaching without pain in order to demonstrate functional improvement in pain and ROM.    Time 6    Period Weeks    Status New      PT LONG TERM GOAL #3   Title Pt to report ability for ambulation for at least 30 min, with pain no greater than 3/10 , to improve ability for IADLS.    Time 6    Period Weeks    Status Partially Met      PT LONG TERM GOAL #4   Title Pt to demo improved lumbar ROM to have only minimal deficit, and no pain, to improve ADLs and IADLS.    Time 6    Period Weeks    Status Partially Met      PT LONG TERM GOAL #5   Title Pt will be able to demonstrate self hygiene simulated motions without pain in order to demonstrate functional improvement in T/S and R UE function for independence and pain free ADL and IADL.    Time 8    Period Weeks    Status New      Additional Long Term Goals   Additional Long Term Goals Yes                 Plan - 02/18/21 1343    Clinical Impression Statement Pt demonstrates improved R UE ROM and T/S rotation after STM and exercise. Pt responded most positively to T/S open book stretch. Pt reports increased sensation of stretching prior to reaching end range with open book. Pt s/s still remain  sensitive and highly irritable with combined rotation and UE reaching. Pt was able to progress to more aggressive anterior shoulder and pectoralis stretching positions without increased pain. Plan to progress T/S mobility at next session. Pt would benefit from continued skileld therapy in ordre to maximize functional lumbopelvic strength as well as R UE strength, function, and mobility for safe and pain free daily function.    Personal Factors and Comorbidities Comorbidity 1;Comorbidity 2    Examination-Activity Limitations Locomotion Level;Transfers;Bend;Sit;Squat;Stairs;Stand;Lift    Examination-Participation Restrictions Meal Prep;Cleaning;Community Activity;Shop    Stability/Clinical Decision Making Stable/Uncomplicated    Rehab Potential Good    PT Frequency 2x / week    PT Duration 6 weeks    PT Treatment/Interventions ADLs/Self Care Home Management;Cryotherapy;Electrical Stimulation;Iontophoresis 4mg /ml Dexamethasone;Moist Heat;Traction;Ultrasound;Balance training;Therapeutic exercise;Therapeutic activities;Functional mobility training;Stair training;Gait training;Neuromuscular re-education;Patient/family education;Manual techniques;Passive range of motion;Vasopneumatic Device;Taping;Dry needling;Spinal Manipulations;Joint Manipulations    PT Next Visit Plan STM, joint mob, hip hinge review, AAROM cane scaption/ flexion, T/S rotation, T/S extension    PT Home Exercise Plan [email or web address], [email or web address]    Consulted and Agree with Plan of Care Patient  Patient will benefit from skilled therapeutic intervention in order to improve the following deficits and impairments:  Decreased range of motion,Difficulty walking,Decreased activity tolerance,Impaired UE functional use,Increased muscle spasms,Pain,Impaired flexibility,Improper body mechanics,Decreased strength,Decreased mobility  Visit Diagnosis: Pain, lumbar region  Muscle weakness (generalized)  Pain in right arm     Problem  List Patient Active Problem List   Diagnosis Date Noted  . Genetic testing 08/05/2020  . Fibromyalgia   . MDD (major depressive disorder), recurrent, in full remission ([place]) 12/10/2019  . Pulmonary fibrosis ([place]) 12/04/2018  . MDD (major depressive disorder), recurrent episode, mild ([place]) 12/22/2017  . Osteoporosis 07/14/2017  . OA (osteoarthritis) of knee 11/03/2015  . Osteopenia 08/08/2015  . Headache 02/03/2015  . Atypical chest pain 07/15/2014  . Arthralgia 02/22/2014  . Psoriasis 02/22/2014  . Malignant neoplasm of lower-outer quadrant of right breast of female, estrogen receptor positive ([place]) 08/09/2013  . Neck pain 06/22/2013  . Left knee pain 11/29/2012  . Reactive depression (situational) 04/26/2012  . Right wrist pain 02/02/2012  . Right foot pain 10/04/2011  . Nevus 06/05/2011  . Status post total knee replacement 04/06/2011  . Overactive bladder 03/14/2011  . KNEE PAIN, LEFT 08/31/2010  . HEARING LOSS 08/17/2010  . HIRSUTISM 06/16/2009  . CYST, IDIOPATHIC 05/20/2008  . IRRITABLE BOWEL SYNDROME 04/15/2008  . Allergic asthma, mild intermittent, uncomplicated 68/10/7516  . GERD 03/19/2008  . COLONIC POLYPS, HX OF 03/19/2008  . NEPHROLITHIASIS, HX OF 03/19/2008  . Hypothyroidism 03/18/2008  . Hyperlipidemia 03/18/2008  . INSOMNIA, CHRONIC 03/18/2008  . FIBROMYALGIA 03/18/2008  . Prediabetes 03/18/2008   Daleen Bo PT, DPT 02/18/21 5:35 PM   [facility] [address] [place], [place], [address] Phone: [phone]   Fax:  [phone]  Name: Arpi Diebold MRN: [id number] Date of Birth: [birth year]

## 2021-02-20 ENCOUNTER — Other Ambulatory Visit: Payer: Self-pay | Admitting: Adult Health

## 2021-02-23 ENCOUNTER — Encounter: Payer: Self-pay | Admitting: Adult Health

## 2021-02-24 ENCOUNTER — Other Ambulatory Visit: Payer: Self-pay | Admitting: Adult Health

## 2021-02-24 DIAGNOSIS — K219 Gastro-esophageal reflux disease without esophagitis: Secondary | ICD-10-CM

## 2021-02-24 MED ORDER — ESOMEPRAZOLE MAGNESIUM 40 MG PO CPDR: 40.0000 mg | DELAYED_RELEASE_CAPSULE | Freq: Every day | ORAL | 0 refills | Status: DC

## 2021-02-24 NOTE — Progress Notes (Signed)
HPI female nonsmoker followed for OSA, insomnia, complicated by hypothyroid, asthma, GERD, psoriasis, CA L breast/ biMastectomy Unattended Home Sleep Test 03/06/2015- severe obstructive sleep apnea/hypopnea syndrome, AHI 44.9 per hour with desaturation to 74% and mean saturation only 89% on room air. Body weight 190 pounds   [place] 4/24  -----------------------------------------------------------------------------------   02/26/20- 80 year old female former smoker followed for OSA, insomnia, complicated by hypothyroid, asthma, GERD, psoriasis, CA  Breast/ bilat mastectomy CPAP auto 8-15/Advanced - dc 'd with weight loss down from 188> 159 lbs Body weight today- 176 lbs -----=f/u pulmonary fibrosis/ OSA  CTa chest 02/26/18- had shown Mild right apical and right greater than left anterior subpleural fibrosis Not using CPAP after weight los. Told she snored at dentist, but husband says "not like before". Looks forward to pool opening so she can swim again to lose weight.  Some morning dry cough- reviewed CXR report. She had XRT as part of breast cancer Rx. Not much aware of GERD, but considered for potential contribution to fibrosis on CXR. We will be watching for progression.  CXR 01/30/20-  The cardiomediastinal contours are unchanged. Normal heart size with mild aortic tortuosity. The lungs are clear. Pulmonary vasculature is normal. No consolidation, pleural effusion, or pneumothorax. No acute osseous abnormalities are seen. Surgical clips in the right greater than left axilla.  IMPRESSION: No acute findings or explanation for cough. BaSwallow 02/13/20- IMPRESSION: No mass or stricture. Spontaneous gastroesophageal reflux to the mid esophagus. Episodes of coughing during this study did not always coincide with visible reflux.  02/25/21- 80 year old female former smoker followed for OSA(quit CPAP with weight loss), insomnia, ILD,  complicated by hypothyroid, asthma, GERD, psoriasis, CA   Breast/ bilat mastectomy/ XRT, Low Back Pain CPAP auto 8-15/Advanced - CPAP dc 'd with weight loss down from 188> 159 lbs Body weight today-180 lbs Covid vax-3 Phizer Flu vax-had Feels ok w/o CPA. Averages 7 hrs sleep with nocturia x 4. Husband says she isn't snoring much. Waiting for pool to open so she can get back to swimming exercise.   ROS-see HPI   + = positive Constitutional:   + diet weight loss, night sweats, fevers, chills, fatigue, lassitude. HEENT:    headaches, difficulty swallowing, +tooth/dental problems, sore throat,       sneezing, itching, ear ache, nasal congestion, post nasal drip, snoring CV:    chest pain, orthopnea, PND, swelling in lower extremities, anasarca,                                                dizziness, palpitations Resp:   +shortness of breath with exertion or at rest.                productive cough,  + non-productive cough, coughing up of blood.              change in color of mucus.  wheezing.   Skin:    rash or lesions. GI:     +heartburn, indigestion, abdominal pain, nausea, vomiting, diarrhea,                 change in bowel habits, loss of appetite GU: dysuria, change in color of urine, no urgency or frequency.   flank pain. MS:   +joint pain, stiffness, decreased range of motion, back pain. Neuro-     nothing unusual Psych:  change in mood  or affect.  depression or anxiety.   memory loss.  OBJ- Physical Exam General- Alert, Oriented, Affect-appropriate, Distress- none acute, + obese Skin- rash-none, lesions- none, excoriation- none Lymphadenopathy- none Head- atraumatic            Eyes- Gross vision intact, PERRLA, conjunctivae and secretions clear            Ears- Hearing, canals-normal            Nose- Clear, no-Septal dev, mucus, polyps, erosion, perforation             Throat- Mallampati III-IV , mucosa clear , drainage- none, tonsils- atrophic Neck- flexible , trachea midline, no stridor , thyroid nl, carotid no bruit Chest -  symmetrical excursion , unlabored           Heart/CV- RRR , no murmur , no gallop  , no rub, nl s1 s2                           - JVD- none , edema- none, stasis changes- none, varices- none           Lung- +minimal crackles in bases  wheeze- none, cough-none , dullness-none, rub- none           Chest wall- + bilateral mastectomy Abd-  Br/ Gen/ Rectal- Not done, not indicated Extrem- cyanosis- none, clubbing- none, atrophy- none, strength- nl, + R arm elastic sleeve Neuro- grossly intact to observation

## 2021-02-25 ENCOUNTER — Ambulatory Visit (INDEPENDENT_AMBULATORY_CARE_PROVIDER_SITE_OTHER): Payer: Medicare HMO | Admitting: Physical Therapy

## 2021-02-25 ENCOUNTER — Encounter: Payer: Self-pay | Admitting: Physical Therapy

## 2021-02-25 ENCOUNTER — Encounter: Payer: Self-pay | Admitting: Internal Medicine

## 2021-02-25 ENCOUNTER — Ambulatory Visit (INDEPENDENT_AMBULATORY_CARE_PROVIDER_SITE_OTHER): Payer: Medicare HMO | Admitting: Internal Medicine

## 2021-02-25 ENCOUNTER — Other Ambulatory Visit: Payer: Self-pay

## 2021-02-25 DIAGNOSIS — M25611 Stiffness of right shoulder, not elsewhere classified: Secondary | ICD-10-CM

## 2021-02-25 DIAGNOSIS — M545 Low back pain, unspecified: Secondary | ICD-10-CM

## 2021-02-25 DIAGNOSIS — G4733 Obstructive sleep apnea (adult) (pediatric): Secondary | ICD-10-CM | POA: Diagnosis not present

## 2021-02-25 DIAGNOSIS — M6281 Muscle weakness (generalized): Secondary | ICD-10-CM

## 2021-02-25 DIAGNOSIS — J8410 Pulmonary fibrosis, unspecified: Secondary | ICD-10-CM

## 2021-02-25 DIAGNOSIS — R262 Difficulty in walking, not elsewhere classified: Secondary | ICD-10-CM

## 2021-02-25 NOTE — Therapy (Signed)
[facility] [address] [place], [place], [address] Phone: [phone]   Fax:  [phone]  Physical Therapy Treatment  Patient Details  Name: Jean Nash MRN: [id number] Date of Birth: 12/12/1940 Referring Provider (PT): [name]   Encounter Date: 02/25/2021   PT End of Session - 02/25/21 1550    Visit Number 13    Number of Visits 20    Date for PT Re-Evaluation 03/13/21    Authorization Type [employer]    PT Start Time 1350    PT Stop Time 1430    PT Time Calculation (min) 40 min    Activity Tolerance Patient tolerated treatment well    Behavior During Therapy [facility] for tasks assessed/performed           Past Medical History:  Diagnosis Date  . Adenomatous colon polyp   . Anxiety    PHOBIAS  . Arthritis   . Breast cancer ([place]) 08/08/13   right LOQ  . Cataract   . Chronic insomnia   . Cluster headaches    history of migraines / NONE FOR SEVERAL YRS  . Depression   . Diverticulosis   . Fatty liver 2011  . Fibromyalgia   . GERD (gastroesophageal reflux disease)   . H/O hiatal hernia   . History of transfusion 08/30/2013  . Hx of radiation therapy 10/29/13- 12/14/13   right chest wall 5040 cGy 28 sessions, right supraclavicular/axillary region 5040 cGy 28 sessions, right chest wall boost 1000 cGy 5 sessions  . Hypothyroidism   . Internal hemorrhoids   . Irritable bowel syndrome   . Kidney stone   . Lymphedema    RT ARM - WEARS SLEEVE  . Macular degeneration    hole/right eye  . MDD (major depressive disorder)   . Osteopenia   . Other abnormal glucose   . Other and unspecified hyperlipidemia   . Pain in joint, shoulder region   . Pneumonia 6283,1517  . Sleep apnea    USES C-PAP  . Stress incontinence, female     Past Surgical History:  Procedure Laterality Date  . ABDOMINAL HYSTERECTOMY    . APPENDECTOMY    . BILATERAL TOTAL MASTECTOMY WITH AXILLARY LYMPH NODE DISSECTION  08/30/2013   Dr  Barry Dienes  . BREAST CYST ASPIRATION     9 cysts  . CATARACT EXTRACTION, BILATERAL  2005/2007  . CHOLECYSTECTOMY    . COLONOSCOPY    . CYSTOCELE REPAIR    . EVACUATION BREAST HEMATOMA Left 08/31/2013   Procedure: EVACUATION HEMATOMA BREAST;  Surgeon: Stark Klein, MD;  Location: [facility];  Service: General;  Laterality: Left;  . EYE SURGERY     to repair macular hole  . FOOT ARTHROPLASTY     lt   . GANGLION CYST EXCISION     rt foot  . HEMORRHOID SURGERY     03/1993  . JOINT REPLACEMENT  03/15/11   left knee replacement  . KNEE ARTHROSCOPY     /partial knee 2016/left knee 2012  . MASS EXCISION  11/04/2011   Procedure: EXCISION MASS;  Surgeon: Cammie Sickle., MD;  Location: [facility];  Service: Orthopedics;  Laterality: Right;  excisional biopsy right ulna mass  . MASTECTOMY W/ SENTINEL NODE BIOPSY Right 08/30/2013   Procedure: RIGHT  AXILLARY SENTINEL LYMPH NODE BIOPSY; Right Axillary Node Disection;  Surgeon: Stark Klein, MD;  Location: [facility];  Service: General;  Laterality: Right;  Right side nuc med 7:00   .  PARTIAL KNEE ARTHROPLASTY Right 11/03/2015   Procedure: RIGHT KNEE MEDIAL UNICOMPARTMENTAL ARTHROPLASTY ;  Surgeon: Gaynelle Arabian, MD;  Location: [facility];  Service: Orthopedics;  Laterality: Right;  . RECTOCELE REPAIR    . SIMPLE MASTECTOMY WITH AXILLARY SENTINEL NODE BIOPSY Left 08/30/2013   Procedure: Bilateral Breast Mastectomy ;  Surgeon: Stark Klein, MD;  Location: [facility];  Service: General;  Laterality: Left;  . skin tags removed     breast, panty line, neckline  . TOE SURGERY     preventative crossover toe surg/right foot  . TOE SURGERY  2009   left foot/screw  in 2nd toe  . TONSILLECTOMY    . UPPER GASTROINTESTINAL ENDOSCOPY      There were no vitals filed for this visit.   Subjective Assessment - 02/25/21 1356    Subjective Pt states that the frequency and intensity of pain has decreased. She states that the pain is not as often with self  hygiene but it will still occur as she is reaching/rotating. Pt states it is down about 50%. Pt reports some back pain today but her and her husband state they would like to focus on R UE during today's session.    Patient is accompained by: Family member   husband   Pertinent History Breast CA and radiation 2014.    Limitations Sitting;Lifting;Standing;Walking;House hold activities    How long can you sit comfortably? N/A    How long can you stand comfortably? 1 hour    How long can you walk comfortably? 30 min-1 hr.    Patient Stated Goals decreased pain    Currently in Pain? Yes    Pain Location Back    Pain Orientation Lower    Pain Descriptors / Indicators Aching;Sore    Pain Onset More than a month ago    Aggravating Factors  transferring    Pain Relieving Factors resting and movement                     Flowsheet Row [facility]patient [facility]  Lymphedema Life Impact Scale Total Score 27.94 %            [facility] Adult PT Treatment/Exercise - 02/25/21 0001      Posture/Postural Control   Posture/Postural Control Postural limitations    Postural Limitations Posterior pelvic tilt;Decreased lumbar lordosis      Exercises   Exercises Shoulder      Shoulder Exercises: Supine   Protraction Limitations 2x10      Shoulder Exercises: Sidelying   Other Sidelying Exercises open book stretch 10x 3s hold      Shoulder Exercises: Standing   External Rotation Limitations 2x10 YTB bilat    Row 20 reps    Theraband Level (Shoulder Row) Level 3 (Green)    Other Standing Exercises AAROM cane ABD 10x    Other Standing Exercises standing shoulder extension red TB 20x; standing cane extension 5s 10x      Shoulder Exercises: ROM/Strengthening   UBE (Upper Arm Bike) L1 3 min fwd and back      Shoulder Exercises: Stretch   Other Shoulder Stretches wall scaption slide 10s 10x                                    PT Education - 02/25/21 1550    Education Details HEP review and consolidate, POC, increased daily physical  movement, exercise progression, anatomy, scar tissue remodeling    Person(s) Educated Patient;Spouse    Methods Explanation;Demonstration;Tactile cues;Verbal cues;Handout    Comprehension Returned demonstration;Verbalized understanding            PT Short Term Goals - 02/11/21 1903      PT SHORT TERM GOAL #1   Title Pt to be independent with HEP for LBP and R underarm pain.    Time 2    Period Weeks    Status New    Target Date 12/29/20             PT Long Term Goals - 02/11/21 1903      PT LONG TERM GOAL #1   Title Pt to be independent with final HEP for LBP and R underarm pain.    Time 8    Period Weeks    Status New      PT LONG TERM GOAL #2   Title Pt will be able to demonstrate full thoracic motion with crossbody reaching without pain in order to demonstrate functional improvement in pain and ROM.    Time 6    Period Weeks    Status New      PT LONG TERM GOAL #3   Title Pt to report ability for ambulation for at least 30 min, with pain no greater than 3/10 , to improve ability for IADLS.    Time 6    Period Weeks    Status Partially Met      PT LONG TERM GOAL #4   Title Pt to demo improved lumbar ROM to have only minimal deficit, and no pain, to improve ADLs and IADLS.    Time 6    Period Weeks    Status Partially Met      PT LONG TERM GOAL #5   Title Pt will be able to demonstrate self hygiene simulated motions without pain in order to demonstrate functional improvement in T/S and R UE function for independence and pain free ADL and IADL.    Time 8    Period Weeks    Status New      Additional Long Term Goals   Additional Long Term Goals Yes                 Plan - 02/25/21 1551    Clinical Impression Statement Pt had increased UE ROM as well as rotational motion at beginning of today's session. Pt was able to perform  UE exercise without the use of manual therapy, though she still does show some soft tissue restrictions, likely previous scarring from surgery.  Pt has decreased scapular excursion and requires increased tactile cuing during rowing and rotational exercise. Pt required increased cuing during review of HEP as well as comprehensive review of L/S and R UE handout in order to promote better compliance at home. Handout printed and provided. Pt is progressing well with decreased pain levels but would benefit from continued skileld therapy in order to correct postural and movement deficits during exercise in order to return to pain free daily function.    Personal Factors and Comorbidities Comorbidity 1;Comorbidity 2    Examination-Activity Limitations Locomotion Level;Transfers;Bend;Sit;Squat;Stairs;Stand;Lift    Examination-Participation Restrictions Meal Prep;Cleaning;Community Activity;Shop    Stability/Clinical Decision Making Stable/Uncomplicated    Rehab Potential Good    PT Frequency 2x / week    PT Duration 6 weeks    PT Treatment/Interventions ADLs/Self Care Home Management;Cryotherapy;Electrical Stimulation;Iontophoresis 75m/ml Dexamethasone;Moist Heat;Traction;Ultrasound;Balance training;Therapeutic exercise;Therapeutic activities;Functional mobility training;Stair  training;Gait training;Neuromuscular re-education;Patient/family education;Manual techniques;Passive range of motion;Vasopneumatic Device;Taping;Dry needling;Spinal Manipulations;Joint Manipulations    PT Next Visit Plan STM, joint mob, T/S rotation seated with dowel, T/S extension, deadlift if treating L/S    PT Home Exercise Plan [email or web address], [email or web address]    Consulted and Agree with Plan of Care Patient           Patient will benefit from skilled therapeutic intervention in order to improve the following deficits and impairments:  Decreased range of motion,Difficulty walking,Decreased activity tolerance,Impaired UE functional use,Increased  muscle spasms,Pain,Impaired flexibility,Improper body mechanics,Decreased strength,Decreased mobility  Visit Diagnosis: Pain, lumbar region  Decreased right shoulder range of motion  Muscle weakness (generalized)  Difficulty walking     Problem List Patient Active Problem List   Diagnosis Date Noted  . Genetic testing 08/05/2020  . Fibromyalgia   . MDD (major depressive disorder), recurrent, in full remission ([place]) 12/10/2019  . Pulmonary fibrosis ([place]) 12/04/2018  . MDD (major depressive disorder), recurrent episode, mild ([place]) 12/22/2017  . Osteoporosis 07/14/2017  . OA (osteoarthritis) of knee 11/03/2015  . Osteopenia 08/08/2015  . Headache 02/03/2015  . Atypical chest pain 07/15/2014  . Arthralgia 02/22/2014  . Psoriasis 02/22/2014  . Malignant neoplasm of lower-outer quadrant of right breast of female, estrogen receptor positive ([place]) 08/09/2013  . Neck pain 06/22/2013  . Left knee pain 11/29/2012  . Reactive depression (situational) 04/26/2012  . Right wrist pain 02/02/2012  . Right foot pain 10/04/2011  . Nevus 06/05/2011  . Status post total knee replacement 04/06/2011  . Overactive bladder 03/14/2011  . KNEE PAIN, LEFT 08/31/2010  . HEARING LOSS 08/17/2010  . HIRSUTISM 06/16/2009  . CYST, IDIOPATHIC 05/20/2008  . IRRITABLE BOWEL SYNDROME 04/15/2008  . Allergic asthma, mild intermittent, uncomplicated 65/78/4696  . GERD 03/19/2008  . COLONIC POLYPS, HX OF 03/19/2008  . NEPHROLITHIASIS, HX OF 03/19/2008  . Hypothyroidism 03/18/2008  . Hyperlipidemia 03/18/2008  . INSOMNIA, CHRONIC 03/18/2008  . FIBROMYALGIA 03/18/2008  . Prediabetes 03/18/2008   Daleen Bo PT, DPT 02/25/21 4:01 PM   [facility] [address] [place], [place], [address] Phone: [phone]   Fax:  [phone]  Name: Jean Nash MRN: [id number] Date of Birth: [birth year]

## 2021-02-25 NOTE — Patient Instructions (Signed)
Access Code: [email or web address] URL: [email or web address] Date: 02/25/2021 Prepared by: Daleen Bo  Exercises Supine Lower Trunk Rotation - 2 x daily - 7 x weekly - 3 sets - 10 reps - 3s hold Supine Single Knee to Chest Stretch - 1 x daily - 7 x weekly - 3 sets - 10 reps Supine Posterior Pelvic Tilt - 2 x daily - 2 sets - 10 reps - 3 hold Supine Bridge with Resistance Band - 1 x daily - 3-4 x weekly - 2 sets - 10 reps Sit to Stand - 1 x daily - 3-4 x weekly - 2 sets - 10 reps Seated Hamstring Stretch - 2 x daily - 3 reps - 30 hold Standing Anti-Rotation Press with Anchored Resistance - 1 x daily - 3-4 x weekly - 2 sets - 10 reps Seated Pelvic Tilt - 1 x daily - 7 x weekly - 3 sets - 10 reps Scaption Wall Slide with Towel - 1 x daily - 3 x weekly - 1 sets - 10 reps - 10 hold Shoulder External Rotation and Scapular Retraction with Resistance - 1 x daily - 3 x weekly - 3 sets - 10 reps Standing Shoulder Row with Anchored Resistance - 1 x daily - 3 x weekly - 3 sets - 10 reps Sidelying Thoracic Rotation with Open Book - 1 x daily - 3 x weekly - 2 sets - 10 reps - 5 hold

## 2021-02-25 NOTE — Patient Instructions (Signed)
I'm glad your sleep is doing better. If you feel you need [name] please call and we will be happy to see you again.

## 2021-03-04 ENCOUNTER — Other Ambulatory Visit: Payer: Self-pay

## 2021-03-04 ENCOUNTER — Ambulatory Visit (INDEPENDENT_AMBULATORY_CARE_PROVIDER_SITE_OTHER): Payer: Medicare HMO | Admitting: Physical Therapy

## 2021-03-04 ENCOUNTER — Encounter: Payer: Self-pay | Admitting: Physical Therapy

## 2021-03-04 DIAGNOSIS — M25611 Stiffness of right shoulder, not elsewhere classified: Secondary | ICD-10-CM

## 2021-03-04 DIAGNOSIS — R262 Difficulty in walking, not elsewhere classified: Secondary | ICD-10-CM | POA: Diagnosis not present

## 2021-03-04 DIAGNOSIS — M6281 Muscle weakness (generalized): Secondary | ICD-10-CM

## 2021-03-04 DIAGNOSIS — M545 Low back pain, unspecified: Secondary | ICD-10-CM

## 2021-03-04 NOTE — Therapy (Signed)
[facility] [address] [place], [place], [address] Phone: [phone]   Fax:  [phone]  Physical Therapy Treatment  Patient Details  Name: Jean Nash MRN: [id number] Date of Birth: 07-Jan-1941 Referring Provider (PT): [name]   Encounter Date: 03/04/2021   PT End of Session - 03/04/21 1447    Visit Number 14    Number of Visits 20    Date for PT Re-Evaluation 03/13/21    Authorization Type [employer]    PT Start Time 5638    PT Stop Time 1430    PT Time Calculation (min) 45 min    Activity Tolerance Patient tolerated treatment well    Behavior During Therapy [facility] for tasks assessed/performed           Past Medical History:  Diagnosis Date  . Adenomatous colon polyp   . Anxiety    PHOBIAS  . Arthritis   . Breast cancer ([place]) 08/08/13   right LOQ  . Cataract   . Chronic insomnia   . Cluster headaches    history of migraines / NONE FOR SEVERAL YRS  . Depression   . Diverticulosis   . Fatty liver 2011  . Fibromyalgia   . GERD (gastroesophageal reflux disease)   . H/O hiatal hernia   . History of transfusion 08/30/2013  . Hx of radiation therapy 10/29/13- 12/14/13   right chest wall 5040 cGy 28 sessions, right supraclavicular/axillary region 5040 cGy 28 sessions, right chest wall boost 1000 cGy 5 sessions  . Hypothyroidism   . Internal hemorrhoids   . Irritable bowel syndrome   . Kidney stone   . Lymphedema    RT ARM - WEARS SLEEVE  . Macular degeneration    hole/right eye  . MDD (major depressive disorder)   . Osteopenia   . Other abnormal glucose   . Other and unspecified hyperlipidemia   . Pain in joint, shoulder region   . Pneumonia 9373,4287  . Sleep apnea    USES C-PAP  . Stress incontinence, female     Past Surgical History:  Procedure Laterality Date  . ABDOMINAL HYSTERECTOMY    . APPENDECTOMY    . BILATERAL TOTAL MASTECTOMY WITH AXILLARY LYMPH NODE DISSECTION  08/30/2013   Dr  Barry Dienes  . BREAST CYST ASPIRATION     9 cysts  . CATARACT EXTRACTION, BILATERAL  2005/2007  . CHOLECYSTECTOMY    . COLONOSCOPY    . CYSTOCELE REPAIR    . EVACUATION BREAST HEMATOMA Left 08/31/2013   Procedure: EVACUATION HEMATOMA BREAST;  Surgeon: Stark Klein, MD;  Location: [facility];  Service: General;  Laterality: Left;  . EYE SURGERY     to repair macular hole  . FOOT ARTHROPLASTY     lt   . GANGLION CYST EXCISION     rt foot  . HEMORRHOID SURGERY     03/1993  . JOINT REPLACEMENT  03/15/11   left knee replacement  . KNEE ARTHROSCOPY     /partial knee 2016/left knee 2012  . MASS EXCISION  11/04/2011   Procedure: EXCISION MASS;  Surgeon: Cammie Sickle., MD;  Location: [facility];  Service: Orthopedics;  Laterality: Right;  excisional biopsy right ulna mass  . MASTECTOMY W/ SENTINEL NODE BIOPSY Right 08/30/2013   Procedure: RIGHT  AXILLARY SENTINEL LYMPH NODE BIOPSY; Right Axillary Node Disection;  Surgeon: Stark Klein, MD;  Location: [facility];  Service: General;  Laterality: Right;  Right side nuc med 7:00   .  PARTIAL KNEE ARTHROPLASTY Right 11/03/2015   Procedure: RIGHT KNEE MEDIAL UNICOMPARTMENTAL ARTHROPLASTY ;  Surgeon: Gaynelle Arabian, MD;  Location: [facility];  Service: Orthopedics;  Laterality: Right;  . RECTOCELE REPAIR    . SIMPLE MASTECTOMY WITH AXILLARY SENTINEL NODE BIOPSY Left 08/30/2013   Procedure: Bilateral Breast Mastectomy ;  Surgeon: Stark Klein, MD;  Location: [facility];  Service: General;  Laterality: Left;  . skin tags removed     breast, panty line, neckline  . TOE SURGERY     preventative crossover toe surg/right foot  . TOE SURGERY  2009   left foot/screw  in 2nd toe  . TONSILLECTOMY    . UPPER GASTROINTESTINAL ENDOSCOPY      There were no vitals filed for this visit.   Subjective Assessment - 03/04/21 1351    Subjective Pt states thats the back pain and underarm pain have increased. It started when she was performing an exercise but she is  not sure which one caused it. She states she is in more back pain today than underarm pain. She also reports walking an extra distance in her neighborhood to meet a neighbor as well as carrying a 60 year old for about an hour.    Patient is accompained by: Family member   husband   Pertinent History Breast CA and radiation 2014.    Limitations Sitting;Lifting;Standing;Walking;House hold activities    How long can you sit comfortably? N/A    How long can you stand comfortably? 1 hour    How long can you walk comfortably? 30 min-1 hr.    Patient Stated Goals decreased pain    Currently in Pain? Yes    Pain Score 5     Pain Location Back    Pain Orientation Lower    Pain Descriptors / Indicators Aching;Sore    Pain Onset More than a month ago    Aggravating Factors  transferring    Pain Relieving Factors resting and movement    Multiple Pain Sites Yes    Pain Score 3    Pain Location Rib cage    Pain Orientation Right    Pain Descriptors / Indicators Sharp    Aggravating Factors  reaching, rotation    Pain Relieving Factors resting                     Flowsheet Row [facility]patient [facility]  Lymphedema Life Impact Scale Total Score 27.94 %            [facility] Adult PT Treatment/Exercise - 03/04/21 0001      Posture/Postural Control   Posture/Postural Control Postural limitations    Postural Limitations Posterior pelvic tilt;Decreased lumbar lordosis      Exercises   Exercises Shoulder      Lumbar Exercises: Stretches   Single Knee to Chest Stretch Limitations 30s 3x    Other Lumbar Stretch Exercise LTR 3s 10x      Lumbar Exercises: Seated   Other Seated Lumbar Exercises forward flexion stretch with swissball 3 way 10x each    Other Seated Lumbar Exercises seated pelvic tilts 10x      Lumbar Exercises: Supine   Pelvic Tilt 10 reps    Bridge 10 reps    Other Supine Lumbar Exercises LE deadbug 2x5;  PPT  with march 10x;  PPT with LE extension 10x      Shoulder Exercises: Supine   Protraction Limitations --  Shoulder Exercises: Sidelying   Other Sidelying Exercises --      Shoulder Exercises: Standing   External Rotation Limitations --    Row --    Theraband Level (Shoulder Row) --    Other Standing Exercises --    Other Standing Exercises --      Shoulder Exercises: ROM/Strengthening   UBE (Upper Arm Bike) --      Shoulder Exercises: Stretch   Other Shoulder Stretches --    Other Shoulder Stretches --      Manual Therapy   Manual Therapy Soft tissue mobilization    Joint Mobilization L2-5 UPA grade II-III, L2-5 CPA grade III    Soft tissue mobilization L>R paraspinals, QL                  PT Education - 03/04/21 1447    Education Details HEP, POC, increased daily physical movement, exercise progression, anatomy, movement variety, thermotherapy    Person(s) Educated Patient;Spouse    Methods Explanation;Demonstration;Tactile cues;Verbal cues    Comprehension Verbalized understanding;Returned demonstration;Verbal cues required            PT Short Term Goals - 02/11/21 1903      PT SHORT TERM GOAL #1   Title Pt to be independent with HEP for LBP and R underarm pain.    Time 2    Period Weeks    Status New    Target Date 12/29/20             PT Long Term Goals - 02/11/21 1903      PT LONG TERM GOAL #1   Title Pt to be independent with final HEP for LBP and R underarm pain.    Time 8    Period Weeks    Status New      PT LONG TERM GOAL #2   Title Pt will be able to demonstrate full thoracic motion with crossbody reaching without pain in order to demonstrate functional improvement in pain and ROM.    Time 6    Period Weeks    Status New      PT LONG TERM GOAL #3   Title Pt to report ability for ambulation for at least 30 min, with pain no greater than 3/10 , to improve ability for IADLS.    Time 6    Period Weeks    Status Partially Met       PT LONG TERM GOAL #4   Title Pt to demo improved lumbar ROM to have only minimal deficit, and no pain, to improve ADLs and IADLS.    Time 6    Period Weeks    Status Partially Met      PT LONG TERM GOAL #5   Title Pt will be able to demonstrate self hygiene simulated motions without pain in order to demonstrate functional improvement in T/S and R UE function for independence and pain free ADL and IADL.    Time 8    Period Weeks    Status New      Additional Long Term Goals   Additional Long Term Goals Yes                 Plan - 03/04/21 1448    Clinical Impression Statement Pt presented with increased L L/S muscular spasm and hypertonicity at today's session. Pt had increased soft tissue extensibility as well as increased ROM following manual therapy. Pt was able to tolerate low level stretching to decrease pain  and improve upright gait posture. Pain likely caused by increased demand in physical carrying and decreased muscular capacity for endurance holds/loading. Pt is progressing well with decreased pain levels, generally, despite recent increase from activity outside of regular routine. Pt would benefit from continued skileld therapy in order to correct postural and movement deficits during exercise in order to return to pain free daily function.    Personal Factors and Comorbidities Comorbidity 1;Comorbidity 2    Examination-Activity Limitations Locomotion Level;Transfers;Bend;Sit;Squat;Stairs;Stand;Lift    Examination-Participation Restrictions Meal Prep;Cleaning;Community Activity;Shop    Stability/Clinical Decision Making Stable/Uncomplicated    Rehab Potential Good    PT Frequency 2x / week    PT Duration 6 weeks    PT Treatment/Interventions ADLs/Self Care Home Management;Cryotherapy;Electrical Stimulation;Iontophoresis 4mg /ml Dexamethasone;Moist Heat;Traction;Ultrasound;Balance training;Therapeutic exercise;Therapeutic activities;Functional mobility training;Stair  training;Gait training;Neuromuscular re-education;Patient/family education;Manual techniques;Passive range of motion;Vasopneumatic Device;Taping;Dry needling;Spinal Manipulations;Joint Manipulations    PT Next Visit Plan STM, joint mob, T/S rotation seated with dowel, T/S extension, deadlift if treating L/S    PT Home Exercise Plan [id number], [id number]    Consulted and Agree with Plan of Care Patient           Patient will benefit from skilled therapeutic intervention in order to improve the following deficits and impairments:  Decreased range of motion,Difficulty walking,Decreased activity tolerance,Impaired UE functional use,Increased muscle spasms,Pain,Impaired flexibility,Improper body mechanics,Decreased strength,Decreased mobility  Visit Diagnosis: Pain, lumbar region  Decreased right shoulder range of motion  Muscle weakness (generalized)  Difficulty walking     Problem List Patient Active Problem List   Diagnosis Date Noted  . Genetic testing 08/05/2020  . Fibromyalgia   . MDD (major depressive disorder), recurrent, in full remission ([place]) 12/10/2019  . Pulmonary fibrosis ([place]) 12/04/2018  . MDD (major depressive disorder), recurrent episode, mild ([place]) 12/22/2017  . Osteoporosis 07/14/2017  . OA (osteoarthritis) of knee 11/03/2015  . Osteopenia 08/08/2015  . Headache 02/03/2015  . Atypical chest pain 07/15/2014  . Arthralgia 02/22/2014  . Psoriasis 02/22/2014  . Malignant neoplasm of lower-outer quadrant of right breast of female, estrogen receptor positive ([place]) 08/09/2013  . Neck pain 06/22/2013  . Left knee pain 11/29/2012  . Reactive depression (situational) 04/26/2012  . Right wrist pain 02/02/2012  . Right foot pain 10/04/2011  . Nevus 06/05/2011  . Status post total knee replacement 04/06/2011  . Overactive bladder 03/14/2011  . KNEE PAIN, LEFT 08/31/2010  . HEARING LOSS 08/17/2010  . HIRSUTISM 06/16/2009  . CYST, IDIOPATHIC 05/20/2008  . IRRITABLE  BOWEL SYNDROME 04/15/2008  . Allergic asthma, mild intermittent, uncomplicated 28/00/3491  . GERD 03/19/2008  . COLONIC POLYPS, HX OF 03/19/2008  . NEPHROLITHIASIS, HX OF 03/19/2008  . Hypothyroidism 03/18/2008  . Hyperlipidemia 03/18/2008  . INSOMNIA, CHRONIC 03/18/2008  . FIBROMYALGIA 03/18/2008  . Prediabetes 03/18/2008    Daleen Bo PT, DPT 03/04/21 2:53 PM   [facility] [address] [place], [place], [address] Phone: [phone]   Fax:  [phone]  Name: Lynix Bonine MRN: [id number] Date of Birth: 05/04/1941

## 2021-03-06 ENCOUNTER — Encounter: Payer: Self-pay | Admitting: Adult Health

## 2021-03-06 ENCOUNTER — Other Ambulatory Visit: Payer: Self-pay

## 2021-03-06 ENCOUNTER — Ambulatory Visit (INDEPENDENT_AMBULATORY_CARE_PROVIDER_SITE_OTHER): Payer: Medicare HMO | Admitting: Adult Health

## 2021-03-06 VITALS — BP 110/80 | HR 78 | Temp 97.9°F | Ht 59.0 in | Wt 176.0 lb

## 2021-03-06 DIAGNOSIS — M1711 Unilateral primary osteoarthritis, right knee: Secondary | ICD-10-CM

## 2021-03-06 DIAGNOSIS — Z8669 Personal history of other diseases of the nervous system and sense organs: Secondary | ICD-10-CM

## 2021-03-06 DIAGNOSIS — Z0000 Encounter for general adult medical examination without abnormal findings: Secondary | ICD-10-CM

## 2021-03-06 DIAGNOSIS — K219 Gastro-esophageal reflux disease without esophagitis: Secondary | ICD-10-CM

## 2021-03-06 DIAGNOSIS — E039 Hypothyroidism, unspecified: Secondary | ICD-10-CM | POA: Diagnosis not present

## 2021-03-06 DIAGNOSIS — E785 Hyperlipidemia, unspecified: Secondary | ICD-10-CM

## 2021-03-06 DIAGNOSIS — F419 Anxiety disorder, unspecified: Secondary | ICD-10-CM

## 2021-03-06 DIAGNOSIS — M797 Fibromyalgia: Secondary | ICD-10-CM

## 2021-03-06 DIAGNOSIS — F3342 Major depressive disorder, recurrent, in full remission: Secondary | ICD-10-CM

## 2021-03-06 LAB — COMPREHENSIVE METABOLIC PANEL
ALT: 27 U/L (ref 0–35)
AST: 17 U/L (ref 0–37)
Albumin: 4.3 g/dL (ref 3.5–5.2)
Alkaline Phosphatase: 76 U/L (ref 39–117)
BUN: 17 mg/dL (ref 6–23)
CO2: 27 mEq/L (ref 19–32)
Calcium: 9.5 mg/dL (ref 8.4–10.5)
Chloride: 104 mEq/L (ref 96–112)
Creatinine, Ser: 0.75 mg/dL (ref 0.40–1.20)
GFR: 76.53 mL/min (ref >60.00)
Glucose, Bld: 122 mg/dL — ABNORMAL HIGH (ref 70–99)
Potassium: 4.6 mEq/L (ref 3.5–5.1)
Sodium: 140 mEq/L (ref 135–145)
Total Bilirubin: 0.7 mg/dL (ref 0.2–1.2)
Total Protein: 6.5 g/dL (ref 6.0–8.3)

## 2021-03-06 LAB — CBC WITH DIFFERENTIAL/PLATELET
Basophils Absolute: 0 10*3/uL (ref 0.0–0.1)
Basophils Relative: 0.4 % (ref 0.0–3.0)
Eosinophils Absolute: 0.2 10*3/uL (ref 0.0–0.7)
Eosinophils Relative: 2.8 % (ref 0.0–5.0)
HCT: 46.7 % — ABNORMAL HIGH (ref 36.0–46.0)
Hemoglobin: 15.5 g/dL — ABNORMAL HIGH (ref 12.0–15.0)
Lymphocytes Relative: 28.7 % (ref 12.0–46.0)
Lymphs Abs: 1.9 10*3/uL (ref 0.7–4.0)
MCHC: 33.1 g/dL (ref 30.0–36.0)
MCV: 88.5 fl (ref 78.0–100.0)
Monocytes Absolute: 0.5 10*3/uL (ref 0.1–1.0)
Monocytes Relative: 8.2 % (ref 3.0–12.0)
Neutro Abs: 3.9 10*3/uL (ref 1.4–7.7)
Neutrophils Relative %: 59.9 % (ref 43.0–77.0)
Platelets: 185 10*3/uL (ref 150.0–400.0)
RBC: 5.27 Mil/uL — ABNORMAL HIGH (ref 3.87–5.11)
RDW: 14.9 % (ref 11.5–15.5)
WBC: 6.6 10*3/uL (ref 4.0–10.5)

## 2021-03-06 LAB — URINALYSIS, ROUTINE W REFLEX MICROSCOPIC
Bilirubin Urine: NEGATIVE
Ketones, ur: NEGATIVE
Nitrite: NEGATIVE
Specific Gravity, Urine: 1.02 (ref 1.000–1.030)
Total Protein, Urine: NEGATIVE
Urine Glucose: NEGATIVE
Urobilinogen, UA: 0.2 (ref 0.0–1.0)
pH: 6 (ref 5.0–8.0)

## 2021-03-06 LAB — LIPID PANEL
Cholesterol: 209 mg/dL — ABNORMAL HIGH (ref 0–200)
HDL: 51.1 mg/dL (ref >39.00)
LDL Cholesterol: 138 mg/dL — ABNORMAL HIGH (ref 0–99)
NonHDL: 158.36
Total CHOL/HDL Ratio: 4
Triglycerides: 101 mg/dL (ref 0.0–149.0)
VLDL: 20.2 mg/dL (ref 0.0–40.0)

## 2021-03-06 LAB — TSH: TSH: 2.24 u[IU]/mL (ref 0.35–4.50)

## 2021-03-06 MED ORDER — BUTALBITAL-APAP-CAFFEINE 50-325-40 MG PO TABS: 1.0000 | ORAL_TABLET | ORAL | 1 refills | Status: DC | PRN

## 2021-03-06 NOTE — Progress Notes (Signed)
Subjective:    [name], female    DOB: [birth year], 80 y.o.   MRN: [id number]  HPI Patient presents for yearly preventative medicine examination. She is a pleasant 80 year old female who  has a past medical history of Adenomatous colon polyp, Anxiety, Arthritis, Breast cancer (HCC) (08/08/13), Cataract, Chronic insomnia, Cluster headaches, Depression, Diverticulosis, Fatty liver (2011), Fibromyalgia, GERD (gastroesophageal reflux disease), H/O hiatal hernia, History of transfusion (08/30/2013), radiation therapy (10/29/13- 12/14/13), Hypothyroidism, Internal hemorrhoids, Irritable bowel syndrome, Kidney stone, Lymphedema, Macular degeneration, MDD (major depressive disorder), Osteopenia, Other abnormal glucose, Other and unspecified hyperlipidemia, Pain in joint, shoulder region, Pneumonia (4388,8757), Sleep apnea, and Stress incontinence, female.  Hypothyroidism -takes Synthroid 100 mcg daily.  She does feel well controlled on this medication Lab Results  Component Value Date   TSH 1.45 05/20/2020   Migraine Headaches -takes to help Topamax 25 mg daily as Fioricet as needed. Due to changes in season she has had more migraines and has subsequently had to take more doses of Fioricet  Anxiety and depression-prescribed Effexor 37.5 mg extended release daily and Valium PRN   Hyperlipidemia-takes Zocor 10 mg at bedtime and Lovaza 1 g twice daily. She denies myalgia or fatigue  Lab Results  Component Value Date   CHOL 202 (H) 11/15/2019   HDL 51.90 11/15/2019   LDLCALC 128 (H) 11/15/2019   LDLDIRECT 113.0 05/17/2016   TRIG 109.0 11/15/2019   CHOLHDL 4 11/15/2019   Hx of Fibromyalgia -managed by rheumatology.  Denies any recent flares.  Energy level has been stable and she is sleeping well at night.  She has been exercising at home using an exercise bike to work on lower extremity muscle strengthening  Osteoarthritis -bilateral hands, low back and knees.  She did undergo an  ultrasound-guided bilateral CMC joint injection on 07/02/2020 which alleviated her discomfort.  She is going to physical therapy for low back pain and has noticed significant improvement in her low back pain. Has had Visco gel injections in her right knee which is partially replaced and reports that these injections have eased her discomfort.  Her left knee was totally replaced and she reports that she is doing well with her right knee.  GERD - uses Prilosec and Pepcid - feels well controlled on this combination. Has had GI bleed over the last year. No longer having blood in stool. Is seen by GI   All immunizations and health maintenance protocols were reviewed with the patient and needed orders were placed.  Appropriate screening laboratory values were ordered for the patient including screening of hyperlipidemia, renal function and hepatic function.  Medication reconciliation,  past medical history, social history, problem list and allergies were reviewed in detail with the patient  Goals were established with regard to weight loss, exercise, and  diet in compliance with medications. She is walking more now that her low back pain has been improving. She is going about " a city block before she becomes winded". Looking forward to opening her pool. Does eat healthy  Wt Readings from Last 3 Encounters:  03/06/21 176 lb (79.8 kg)  02/25/21 180 lb 12.8 oz (82 kg)  12/31/20 175 lb 12.8 oz (79.7 kg)   Review of Systems  Constitutional: Positive for fatigue.  HENT: Negative.   Eyes: Negative.   Respiratory: Negative.   Cardiovascular: Negative.   Gastrointestinal: Negative.   Endocrine: Negative.   Genitourinary: Negative.   Musculoskeletal: Positive for arthralgias and back pain. Negative for myalgias.  Skin: Negative.   Allergic/Immunologic: Negative.   Neurological: Negative.   Hematological: Negative.   Psychiatric/Behavioral: Negative.    Past Medical History:  Diagnosis Date  .  Adenomatous colon polyp   . Anxiety    PHOBIAS  . Arthritis   . Breast cancer ([place]) 08/08/13   right LOQ  . Cataract   . Chronic insomnia   . Cluster headaches    history of migraines / NONE FOR SEVERAL YRS  . Depression   . Diverticulosis   . Fatty liver 2011  . Fibromyalgia   . GERD (gastroesophageal reflux disease)   . H/O hiatal hernia   . History of transfusion 08/30/2013  . Hx of radiation therapy 10/29/13- 12/14/13   right chest wall 5040 cGy 28 sessions, right supraclavicular/axillary region 5040 cGy 28 sessions, right chest wall boost 1000 cGy 5 sessions  . Hypothyroidism   . Internal hemorrhoids   . Irritable bowel syndrome   . Kidney stone   . Lymphedema    RT ARM - WEARS SLEEVE  . Macular degeneration    hole/right eye  . MDD (major depressive disorder)   . Osteopenia   . Other abnormal glucose   . Other and unspecified hyperlipidemia   . Pain in joint, shoulder region   . Pneumonia 6606,3016  . Sleep apnea    USES C-PAP  . Stress incontinence, female     Social History   Socioeconomic History  . Marital status: Married    Spouse name: Not on file  . Number of children: 3  . Years of education: Not on file  . Highest education level: Not on file  Occupational History  . Occupation: retired bookkeeper  Tobacco Use  . Smoking status: Former Smoker    Packs/day: 0.10    Years: 2.00    Pack years: 0.20    Types: Cigarettes    Start date: 11/16/1959    Quit date: 11/15/1960    Years since quitting: 60.3  . Smokeless tobacco: Never Used  Vaping Use  . Vaping Use: Never used  Substance and Sexual Activity  . Alcohol use: Yes    Comment: occ  . Drug use: No  . Sexual activity: Not on file    Comment: menarche age 12, fist live birth 61, P 3, hysterectomy age 3, no HRT, BCP 2 yrs  Other Topics Concern  . Not on file  Social History Narrative   Occupation:  Retired Radiation protection practitioner    Married with 3 grown children      Never Smoked     Alcohol use-no         Social Determinants of Radio broadcast assistant Strain: Low Risk   . Difficulty of Paying Living Expenses: Not hard at all  Food Insecurity: Not on file  Transportation Needs: No Transportation Needs  . Lack of Transportation (Medical): No  . Lack of Transportation (Non-Medical): No  Physical Activity: Sufficiently Active  . Days of Exercise per Week: 7 days  . Minutes of Exercise per Session: 60 min  Stress: No Stress Concern Present  . Feeling of Stress : Not at all  Social Connections: Moderately Integrated  . Frequency of Communication with Friends and Family: More than three times a week  . Frequency of Social Gatherings with Friends and Family: Twice a week  . Attends Religious Services: More than 4 times per year  . Active Member of Clubs or Organizations: No  . Attends Archivist Meetings: Never  .  Marital Status: Married  Human resources officer Violence: Not At Risk  . Fear of Current or Ex-Partner: No  . Emotionally Abused: No  . Physically Abused: No  . Sexually Abused: No    Past Surgical History:  Procedure Laterality Date  . ABDOMINAL HYSTERECTOMY    . APPENDECTOMY    . BILATERAL TOTAL MASTECTOMY WITH AXILLARY LYMPH NODE DISSECTION  08/30/2013   Dr Barry Dienes  . BREAST CYST ASPIRATION     9 cysts  . CATARACT EXTRACTION, BILATERAL  2005/2007  . CHOLECYSTECTOMY    . COLONOSCOPY    . CYSTOCELE REPAIR    . EVACUATION BREAST HEMATOMA Left 08/31/2013   Procedure: EVACUATION HEMATOMA BREAST;  Surgeon: Stark Klein, MD;  Location: [facility];  Service: General;  Laterality: Left;  . EYE SURGERY     to repair macular hole  . FOOT ARTHROPLASTY     lt   . GANGLION CYST EXCISION     rt foot  . HEMORRHOID SURGERY     03/1993  . JOINT REPLACEMENT  03/15/11   left knee replacement  . KNEE ARTHROSCOPY     /partial knee 2016/left knee 2012  . MASS EXCISION  11/04/2011   Procedure: EXCISION MASS;  Surgeon: Cammie Sickle., MD;  Location: [facility];  Service: Orthopedics;  Laterality: Right;  excisional biopsy right ulna mass  . MASTECTOMY W/ SENTINEL NODE BIOPSY Right 08/30/2013   Procedure: RIGHT  AXILLARY SENTINEL LYMPH NODE BIOPSY; Right Axillary Node Disection;  Surgeon: Stark Klein, MD;  Location: [facility];  Service: General;  Laterality: Right;  Right side nuc med 7:00   . PARTIAL KNEE ARTHROPLASTY Right 11/03/2015   Procedure: RIGHT KNEE MEDIAL UNICOMPARTMENTAL ARTHROPLASTY ;  Surgeon: Gaynelle Arabian, MD;  Location: [facility];  Service: Orthopedics;  Laterality: Right;  . RECTOCELE REPAIR    . SIMPLE MASTECTOMY WITH AXILLARY SENTINEL NODE BIOPSY Left 08/30/2013   Procedure: Bilateral Breast Mastectomy ;  Surgeon: Stark Klein, MD;  Location: [facility];  Service: General;  Laterality: Left;  . skin tags removed     breast, panty line, neckline  . TOE SURGERY     preventative crossover toe surg/right foot  . TOE SURGERY  2009   left foot/screw  in 2nd toe  . TONSILLECTOMY    . UPPER GASTROINTESTINAL ENDOSCOPY      Family History  Problem Relation Age of Onset  . Stroke Mother        died age 67  . Diabetes Mother   . Breast cancer Mother 58  . Breast cancer Sister 71  . Breast cancer Paternal Aunt 50  . Diabetes Maternal Grandfather   . Breast cancer Paternal Grandmother 11  . Breast cancer Paternal Aunt        dx in her 64s  . Cancer Maternal Grandmother        intra-abdominal cancer  . Brain cancer Maternal Uncle 8  . Brain cancer Cousin 72       maternal cousin  . Brain cancer Cousin 20       paternal cousin  . Colon cancer Neg Hx     No Known Allergies  Current Outpatient Medications on File Prior to Visit  Medication Sig Dispense Refill  . butalbital-acetaminophen-caffeine (FIORICET) 50-325-40 MG tablet Take 1 tablet by mouth as needed for headache. 14 tablet 0  . Calcium Carbonate-Vitamin D (CALCIUM-VITAMIN D3 PO) Take 1 tablet by mouth in the morning and at bedtime.    . Cholecalciferol (  VITAMIN D3) 50  MCG (2000 UT) TABS Take 1 tablet by mouth daily.     . cyclobenzaprine (FLEXERIL) 5 MG tablet Take 1 tablet (5 mg total) by mouth at bedtime as needed for muscle spasms. 30 tablet 0  . diazepam (VALIUM) 5 MG tablet Take 5 mg by mouth every 6 (six) hours as needed for anxiety. As needed (flying & dental)    . esomeprazole (NEXIUM) 40 MG capsule Take 1 capsule (40 mg total) by mouth daily before breakfast. Take 1 capsule by mouth  daily 365 capsule 0  . famotidine (PEPCID) 40 MG tablet TAKE ONE TABLET BY MOUTH AT BEDTIME 90 tablet 1  . levothyroxine (SYNTHROID) 100 MCG tablet Take 1 tablet (100 mcg total) by mouth daily. 365 tablet 0  . Lutein-Zeaxanthin 25-5 MG CAPS Take 1 capsule by mouth daily.    Marland Kitchen MELATONIN PO Take 1 tablet by mouth daily.    . simvastatin (ZOCOR) 10 MG tablet Take 1 tablet (10 mg total) by mouth at bedtime. 365 tablet 0  . topiramate (TOPAMAX) 25 MG tablet TAKE ONE TABLET BY MOUTH DAILY 325 tablet 0  . traMADol (ULTRAM) 50 MG tablet Take 50 mg by mouth every 6 (six) hours as needed.    . venlafaxine XR (EFFEXOR-XR) 37.5 MG 24 hr capsule Take 1 capsule (37.5 mg total) by mouth daily with breakfast. 90 capsule 1  . omega-3 acid ethyl esters (LOVAZA) 1 g capsule Take 1 capsule (1 g total) by mouth 2 (two) times daily. 180 capsule 3   No current facility-administered medications on file prior to visit.    BP 110/80 (BP Location: Left Arm, Patient Position: Sitting, Cuff Size: Normal)   Pulse 78   Temp 97.9 F (36.6 C) (Oral)   Ht 4\' 11"  (1.499 m)   Wt 176 lb (79.8 kg)   SpO2 95%   BMI 35.55 kg/m       Objective:   Physical Exam Vitals and nursing note reviewed.  Constitutional:      General: She is not in acute distress.    Appearance: Normal appearance. She is well-developed. She is not ill-appearing.  HENT:     Head: Normocephalic and atraumatic.     Right Ear: Tympanic membrane, ear canal and external ear normal. There is no impacted cerumen.     Left Ear:  Tympanic membrane, ear canal and external ear normal. There is no impacted cerumen.  Eyes:     General:        Right eye: No discharge.        Left eye: No discharge.     Extraocular Movements: Extraocular movements intact.     Conjunctiva/sclera: Conjunctivae normal.     Pupils: Pupils are equal, round, and reactive to light.  Neck:     Thyroid: No thyromegaly.     Vascular: No carotid bruit.     Trachea: No tracheal deviation.  Cardiovascular:     Rate and Rhythm: Normal rate and regular rhythm.     Pulses: Normal pulses.     Heart sounds: Normal heart sounds. No murmur heard. No friction rub. No gallop.   Pulmonary:     Effort: Pulmonary effort is normal. No respiratory distress.     Breath sounds: Normal breath sounds. No stridor. No wheezing, rhonchi or rales.  Chest:     Chest wall: No tenderness.  Abdominal:     General: Abdomen is flat. Bowel sounds are normal. There is no distension.  Palpations: Abdomen is soft. There is no mass.     Tenderness: There is no abdominal tenderness. There is no right CVA tenderness, left CVA tenderness, guarding or rebound.     Hernia: No hernia is present.  Musculoskeletal:        General: No swelling, tenderness, deformity or signs of injury. Normal range of motion.     Cervical back: Normal range of motion and neck supple.     Right lower leg: No edema.     Left lower leg: No edema.  Lymphadenopathy:     Cervical: No cervical adenopathy.  Skin:    General: Skin is warm and dry.     Coloration: Skin is not jaundiced or pale.     Findings: No bruising, erythema, lesion or rash.  Neurological:     General: No focal deficit present.     Mental Status: She is alert and oriented to person, place, and time.     Cranial Nerves: No cranial nerve deficit.     Sensory: No sensory deficit.     Motor: No weakness.     Coordination: Coordination normal.     Gait: Gait normal.     Deep Tendon Reflexes: Reflexes normal.  Psychiatric:         Mood and Affect: Mood normal.        Behavior: Behavior normal.        Thought Content: Thought content normal.        Judgment: Judgment normal.       Assessment & Plan:  1. Routine general medical examination at a health care facility -  Would like her to start exercising more  - Follow up in one or sooner if needed - CBC with Differential/Platelet; Future - Comprehensive metabolic panel; Future - Lipid panel; Future - TSH; Future - Urinalysis; Future - Urinalysis - TSH - CBC with Differential/Platelet - Lipid panel - Comprehensive metabolic panel  2. MDD (major depressive disorder), recurrent, in full remission ([place]) - well controlled with Effexor   3. Hypothyroidism, unspecified type - Consider increase in synthroid - CBC with Differential/Platelet; Future - Comprehensive metabolic panel; Future - Lipid panel; Future - TSH; Future - Urinalysis; Future - Urinalysis - TSH - CBC with Differential/Platelet - Lipid panel - Comprehensive metabolic panel  4. Hyperlipidemia, unspecified hyperlipidemia type - Consider increase in statin  - CBC with Differential/Platelet; Future - Comprehensive metabolic panel; Future - Lipid panel; Future - TSH; Future - Urinalysis; Future - Urinalysis - TSH - CBC with Differential/Platelet - Lipid panel - Comprehensive metabolic panel  5. Hx of migraine headaches  - butalbital-acetaminophen-caffeine (FIORICET) 50-325-40 MG tablet; Take 1 tablet by mouth as needed for headache.  Dispense: 30 tablet; Refill: 1 - CBC with Differential/Platelet; Future - Comprehensive metabolic panel; Future - Lipid panel; Future - TSH; Future - Urinalysis; Future - Urinalysis - TSH - CBC with Differential/Platelet - Lipid panel - Comprehensive metabolic panel  6. Fibromyalgia - No acuter flare - Follow up with Rheumatology as directed - Stay active   7. Primary osteoarthritis of right knee - Continue with PT  - Encouraged to start  exercising more    8. Gastroesophageal reflux disease without esophagitis - Continue with Pepcid and Nexium - Follow up with GI as directed - CBC with Differential/Platelet; Future - Comprehensive metabolic panel; Future - Lipid panel; Future - TSH; Future - Urinalysis; Future - Urinalysis - TSH - CBC with Differential/Platelet - Lipid panel - Comprehensive metabolic panel  9. Anxiety -  Continue with Effexor and Valium PRN   [name], NP

## 2021-03-10 ENCOUNTER — Other Ambulatory Visit: Payer: Self-pay | Admitting: *Deleted

## 2021-03-10 ENCOUNTER — Encounter: Payer: Self-pay | Admitting: Adult Health

## 2021-03-10 DIAGNOSIS — Z8669 Personal history of other diseases of the nervous system and sense organs: Secondary | ICD-10-CM

## 2021-03-10 MED ORDER — SIMVASTATIN 20 MG PO TABS: 20.0000 mg | ORAL_TABLET | Freq: Every day | ORAL | 0 refills | Status: DC

## 2021-03-11 ENCOUNTER — Encounter: Payer: Medicare HMO | Admitting: Physical Therapy

## 2021-03-12 ENCOUNTER — Encounter: Payer: Self-pay | Admitting: Adult Health

## 2021-03-18 ENCOUNTER — Encounter: Payer: Medicare HMO | Admitting: Physical Therapy

## 2021-03-25 ENCOUNTER — Other Ambulatory Visit: Payer: Self-pay

## 2021-03-25 ENCOUNTER — Encounter: Payer: Self-pay | Admitting: Physical Therapy

## 2021-03-25 ENCOUNTER — Ambulatory Visit (INDEPENDENT_AMBULATORY_CARE_PROVIDER_SITE_OTHER): Payer: Medicare HMO | Admitting: Physical Therapy

## 2021-03-25 DIAGNOSIS — M25611 Stiffness of right shoulder, not elsewhere classified: Secondary | ICD-10-CM | POA: Diagnosis not present

## 2021-03-25 DIAGNOSIS — M25561 Pain in right knee: Secondary | ICD-10-CM

## 2021-03-25 DIAGNOSIS — M6281 Muscle weakness (generalized): Secondary | ICD-10-CM | POA: Diagnosis not present

## 2021-03-25 DIAGNOSIS — M545 Low back pain, unspecified: Secondary | ICD-10-CM | POA: Diagnosis not present

## 2021-03-25 NOTE — Therapy (Addendum)
[facility] [address] [place], [place], [address] Phone: [phone]   Fax:  [phone]  Physical Therapy Progress Note/Discharge  Patient Details  Name: Jean Nash MRN: [id number] Date of Birth: [birth year] Referring Provider (PT): [name]   Encounter Date: 03/25/2021   PT End of Session - 03/25/21 1344     Visit Number 15    Number of Visits 20    Date for PT Re-Evaluation 04/24/21    Authorization Type [employer]    PT Start Time 2263    PT Stop Time 1430    PT Time Calculation (min) 45 min    Activity Tolerance Patient tolerated treatment well    Behavior During Therapy [facility] for tasks assessed/performed             Past Medical History:  Diagnosis Date   Adenomatous colon polyp    Anxiety    PHOBIAS   Arthritis    Breast cancer ([place]) 08/08/13   right LOQ   Cataract    Chronic insomnia    Cluster headaches    history of migraines / NONE FOR SEVERAL YRS   Depression    Diverticulosis    Fatty liver 2011   Fibromyalgia    GERD (gastroesophageal reflux disease)    H/O hiatal hernia    History of transfusion 08/30/2013   Hx of radiation therapy 10/29/13- 12/14/13   right chest wall 5040 cGy 28 sessions, right supraclavicular/axillary region 5040 cGy 28 sessions, right chest wall boost 1000 cGy 5 sessions   Hypothyroidism    Internal hemorrhoids    Irritable bowel syndrome    Kidney stone    Lymphedema    RT ARM - WEARS SLEEVE   Macular degeneration    hole/right eye   MDD (major depressive disorder)    Osteopenia    Other abnormal glucose    Other and unspecified hyperlipidemia    Pain in joint, shoulder region    Pneumonia 3354,5625   Sleep apnea    USES C-PAP   Stress incontinence, female     Past Surgical History:  Procedure Laterality Date   ABDOMINAL HYSTERECTOMY     APPENDECTOMY     BILATERAL TOTAL MASTECTOMY WITH AXILLARY LYMPH NODE DISSECTION  08/30/2013   Dr Barry Dienes   BREAST  CYST ASPIRATION     9 cysts   CATARACT EXTRACTION, BILATERAL  2005/2007   CHOLECYSTECTOMY     COLONOSCOPY     CYSTOCELE REPAIR     EVACUATION BREAST HEMATOMA Left 08/31/2013   Procedure: EVACUATION HEMATOMA BREAST;  Surgeon: Stark Klein, MD;  Location: [facility];  Service: General;  Laterality: Left;   EYE SURGERY     to repair macular hole   FOOT ARTHROPLASTY     lt    GANGLION CYST EXCISION     rt foot   HEMORRHOID SURGERY     03/1993   JOINT REPLACEMENT  03/15/11   left knee replacement   KNEE ARTHROSCOPY     /partial knee 2016/left knee 2012   MASS EXCISION  11/04/2011   Procedure: EXCISION MASS;  Surgeon: Cammie Sickle., MD;  Location: [facility];  Service: Orthopedics;  Laterality: Right;  excisional biopsy right ulna mass   MASTECTOMY W/ SENTINEL NODE BIOPSY Right 08/30/2013   Procedure: RIGHT  AXILLARY SENTINEL LYMPH NODE BIOPSY; Right Axillary Node Disection;  Surgeon: Stark Klein, MD;  Location: [facility];  Service: General;  Laterality: Right;  Right side nuc  med 7:00    PARTIAL KNEE ARTHROPLASTY Right 11/03/2015   Procedure: RIGHT KNEE MEDIAL UNICOMPARTMENTAL ARTHROPLASTY ;  Surgeon: Gaynelle Arabian, MD;  Location: [facility];  Service: Orthopedics;  Laterality: Right;   RECTOCELE REPAIR     SIMPLE MASTECTOMY WITH AXILLARY SENTINEL NODE BIOPSY Left 08/30/2013   Procedure: Bilateral Breast Mastectomy ;  Surgeon: Stark Klein, MD;  Location: [facility];  Service: General;  Laterality: Left;   skin tags removed     breast, panty line, neckline   TOE SURGERY     preventative crossover toe surg/right foot   TOE SURGERY  2009   left foot/screw  in 2nd toe   TONSILLECTOMY     UPPER GASTROINTESTINAL ENDOSCOPY      There were no vitals filed for this visit.   Subjective Assessment - 03/25/21 1344     Subjective Pt states that the L underarm pain has been almost totally gone. She still only has pain with self hygiene during toileting. The back pain is also much better  and it comes and goes. Pt states she currently has a migraine that is seasonally related.    Patient is accompained by: Family member   husband   Pertinent History Breast CA and radiation 2014.    Limitations Sitting;Lifting;Standing;Walking;House hold activities    How long can you sit comfortably? N/A    How long can you stand comfortably? 1 hour    How long can you walk comfortably? 30 min-1 hr.    Patient Stated Goals decreased pain    Currently in Pain? Yes    Pain Score 8     Pain Location Head    Pain Descriptors / Indicators Aching    Pain Type Acute pain    Pain Onset More than a month ago    Aggravating Factors  seasonal                [facility] PT Assessment - 03/25/21 0001       Assessment   Medical Diagnosis Back pain and R underarm pain    Referring Provider (PT) [name]    Prior Therapy no      Precautions   Precautions None      Restrictions   Weight Bearing Restrictions No      Balance Screen   Has the patient fallen in the past 6 months No      [place] residence      Prior Function   Level of Independence Independent      Cognition   Overall Cognitive Status Within Functional Limits for tasks assessed      Observation/Other Assessments   Other Surveys  Quick Dash;Oswestry Disability Index    Oswestry Disability Index  7/50 14%    Quick DASH  27.3%      AROM   Overall AROM Comments R shoulder: IR T10 ER C3    Right Shoulder Flexion 165 Degrees    Right Shoulder ABduction 160 Degrees    Lumbar Flexion WFL    Lumbar Extension 80%    Lumbar - Right Side Bend 75%    Lumbar - Left Side Bend 75%    Lumbar - Right Rotation 75%    Lumbar - Left Rotation 75%    Thoracic Flexion [facility]    Thoracic Extension 80%    Thoracic - Right Side Bend 65%    Thoracic - Left Side Bend 65%    Thoracic - Right Rotation 65%  Thoracic - Left Rotation 60%      Strength   Overall Strength Comments Sit to Stand R  shoulder 4/5 throughout; L 4+/5 throughout  Low table height (standard chair level) with external 10lb weight- Able to perform 10 continuously though has SOB and muscle fatigue following     Palpation   Palpation comment TTP along costal cartilage border on R at T7 and below                           [facility] Adult PT Treatment/Exercise - 03/25/21 0001       Posture/Postural Control   Posture/Postural Control Postural limitations    Postural Limitations Posterior pelvic tilt;Decreased lumbar lordosis      Exercises   Exercises Shoulder      Lumbar Exercises: Stretches   Single Knee to Chest Stretch Limitations 30s 3x    Other Lumbar Stretch Exercise LTR 3s 10x      Lumbar Exercises: Standing   Other Standing Lumbar Exercises STS 2x10 10 lbs, RFD focus    Other Standing Lumbar Exercises Paloff press 2x10 GTB each      Lumbar Exercises: Seated             Lumbar Exercises: Supine   Pelvic Tilt --    Bridge 20 reps    Bridge Limitations with heel lift    Other Supine Lumbar Exercises LE deadbug 2x5; PPT with march 2x10x; PPT with LE extension 2x10x      Shoulder Exercises: Sidelying   Other Sidelying Exercises open book stretch 10x 5s each                                     PT Education - 03/25/21 1452     Education Details HEP, POC, increased daily physical movement, exercise progression, anatomy, polypharmacy, medication consultation resources    Person(s) Educated Patient;Spouse    Methods Explanation;Demonstration;Tactile cues;Verbal cues;Handout    Comprehension Verbalized understanding;Returned demonstration;Verbal cues required;Tactile cues required              PT Short Term Goals - 03/25/21 1742       PT SHORT TERM GOAL #1   Title Pt to be independent with HEP for LBP and R underarm pain.    Time 2    Period Weeks    Status On-going    Target Date 12/29/20               PT Long Term Goals - 03/25/21 1742        PT LONG TERM GOAL #1   Title Pt to be independent with final HEP for LBP and R underarm pain.    Time 8    Period Weeks    Status On-going      PT LONG TERM GOAL #2   Title Pt will be able to demonstrate full thoracic motion with crossbody reaching without pain in order to demonstrate functional improvement in pain and ROM.    Time 6    Period Weeks    Status Partially Met      PT LONG TERM GOAL #3   Title Pt to report ability for ambulation for at least 30 min, with pain no greater than 3/10 , to improve ability for IADLS.    Time 6    Period Weeks    Status Partially Met  PT LONG TERM GOAL #4   Title Pt to demo improved lumbar ROM to have only minimal deficit, and no pain, to improve ADLs and IADLS.    Time 6    Period Weeks    Status Achieved      PT LONG TERM GOAL #5   Title Pt will be able to demonstrate self hygiene simulated motions without pain in order to demonstrate functional improvement in T/S and R UE function for independence and pain free ADL and IADL.    Time 8    Period Weeks    Status Partially Met                   Plan - 03/25/21 1737     Clinical Impression Statement Pt demonstrates mild improvement as shown by objective measures and UE and LBP outcome measures. Pt is significantly improving with T/S mobility and report of decreased R UE/costal pain. Pt also presents with less LBP and and improved LE function, though still showing functional movement deficits. Pt with strength and endurance deficits that are likely due to relatively sedentary lifestyle, on average. Pt does have heated pool available to her at home and is interested in considering aquatic therapy in order to build home exercise and fitness program. Pt likely would improve UE and LBP with increased fitness as exercise tends to improve symptoms. Pt to decrease frequency of therapy due to improvement and move towards more independent management with HEP. Reinforcement and cuing for  regular daily physical activity needed to improve functional capacity. Pt would benefit from continued skileld therapy in order to correct postural and movement deficits during exercise in order to return to pain free daily function.    Personal Factors and Comorbidities Comorbidity 1;Comorbidity 2    Examination-Activity Limitations Locomotion Level;Transfers;Bend;Sit;Squat;Stairs;Stand;Lift    Examination-Participation Restrictions Meal Prep;Cleaning;Community Activity;Shop    Stability/Clinical Decision Making Stable/Uncomplicated    Rehab Potential Good    PT Frequency 2x / week    PT Duration 6 weeks    PT Treatment/Interventions ADLs/Self Care Home Management;Cryotherapy;Electrical Stimulation;Iontophoresis 4mg /ml Dexamethasone;Moist Heat;Traction;Ultrasound;Balance training;Therapeutic exercise;Therapeutic activities;Functional mobility training;Stair training;Gait training;Neuromuscular re-education;Patient/family education;Manual techniques;Passive range of motion;Vasopneumatic Device;Taping;Dry needling;Spinal Manipulations;Joint Manipulations    PT Next Visit Plan T/S extension, deadlift if treating L/S    PT Home Exercise Plan [email or web address], [email or web address]    Consulted and Agree with Plan of Care Patient             Patient will benefit from skilled therapeutic intervention in order to improve the following deficits and impairments:  Decreased range of motion,Difficulty walking,Decreased activity tolerance,Impaired UE functional use,Increased muscle spasms,Pain,Impaired flexibility,Improper body mechanics,Decreased strength,Decreased mobility  Visit Diagnosis: Pain, lumbar region  Decreased right shoulder range of motion  Muscle weakness (generalized)  Pain in joint of right knee     Problem List Patient Active Problem List   Diagnosis Date Noted   Genetic testing 08/05/2020   Fibromyalgia    MDD (major depressive disorder), recurrent, in full remission ([place]) 12/10/2019    Pulmonary fibrosis ([place]) 12/04/2018   MDD (major depressive disorder), recurrent episode, mild ([place]) 12/22/2017   Osteoporosis 07/14/2017   OA (osteoarthritis) of knee 11/03/2015   Osteopenia 08/08/2015   Headache 02/03/2015   Atypical chest pain 07/15/2014   Arthralgia 02/22/2014   Psoriasis 02/22/2014   Malignant neoplasm of lower-outer quadrant of right breast of female, estrogen receptor positive ([place]) 08/09/2013   Neck pain 06/22/2013   Left knee pain 11/29/2012   Reactive depression (situational) 04/26/2012  Right wrist pain 02/02/2012   Right foot pain 10/04/2011   Nevus 06/05/2011   Status post total knee replacement 04/06/2011   Overactive bladder 03/14/2011   KNEE PAIN, LEFT 08/31/2010   HEARING LOSS 08/17/2010   HIRSUTISM 06/16/2009   CYST, IDIOPATHIC 05/20/2008   IRRITABLE BOWEL SYNDROME 04/15/2008   Allergic asthma, mild intermittent, uncomplicated 25/50/0164   GERD 03/19/2008   COLONIC POLYPS, HX OF 03/19/2008   NEPHROLITHIASIS, HX OF 03/19/2008   Hypothyroidism 03/18/2008   Hyperlipidemia 03/18/2008   INSOMNIA, CHRONIC 03/18/2008   FIBROMYALGIA 03/18/2008   Prediabetes 03/18/2008    Daleen Bo PT, DPT 03/25/21 5:45 PM   [facility] [address] [place], [place], [address] Phone: [phone]   Fax:  [phone]  Name: Oyuki Hogan MRN: [id number] Date of Birth: 1941/07/31   PHYSICAL THERAPY DISCHARGE SUMMARY  Visits from Start of Care: 15   Plan: Patient agrees to discharge.  Patient goals were unable to be assessed. Patient is being discharged due to not returning to PT.

## 2021-03-25 NOTE — Patient Instructions (Signed)
Access Code: [email or web address] URL: [email or web address] Date: 03/25/2021 Prepared by: Daleen Bo  Exercises Supine Lower Trunk Rotation - 2 x daily - 7 x weekly - 3 sets - 10 reps - 3s hold Supine Bridge - 1 x daily - 3-4 x weekly - 3 sets - 10 reps Seated Hamstring Stretch - 2 x daily - 3 reps - 30 hold Sit to Stand - 1 x daily - 3-4 x weekly - 2 sets - 10 reps Standing Anti-Rotation Press with Anchored Resistance - 1 x daily - 3-4 x weekly - 2 sets - 10 reps Seated Pelvic Tilt - 1 x daily - 7 x weekly - 3 sets - 10 reps Scaption Wall Slide with Towel - 1 x daily - 3 x weekly - 1 sets - 10 reps - 10 hold Shoulder External Rotation and Scapular Retraction with Resistance - 1 x daily - 3 x weekly - 3 sets - 10 reps Standing Shoulder Row with Anchored Resistance - 1 x daily - 3 x weekly - 3 sets - 10 reps Sidelying Thoracic Rotation with Open Book - 1 x daily - 3 x weekly - 2 sets - 10 reps - 5 hold

## 2021-03-28 ENCOUNTER — Other Ambulatory Visit: Payer: Self-pay | Admitting: Adult Health

## 2021-03-28 DIAGNOSIS — Z760 Encounter for issue of repeat prescription: Secondary | ICD-10-CM

## 2021-04-01 ENCOUNTER — Encounter: Payer: Medicare HMO | Admitting: Physical Therapy

## 2021-04-02 ENCOUNTER — Encounter: Payer: Self-pay | Admitting: Adult Health

## 2021-04-04 ENCOUNTER — Encounter: Payer: Self-pay | Admitting: Adult Health

## 2021-04-08 ENCOUNTER — Encounter: Payer: Medicare HMO | Admitting: Physical Therapy

## 2021-04-09 ENCOUNTER — Other Ambulatory Visit: Payer: Self-pay

## 2021-04-10 ENCOUNTER — Ambulatory Visit (INDEPENDENT_AMBULATORY_CARE_PROVIDER_SITE_OTHER): Payer: Medicare HMO | Admitting: Adult Health

## 2021-04-10 ENCOUNTER — Encounter: Payer: Self-pay | Admitting: Adult Health

## 2021-04-10 VITALS — BP 130/60 | HR 73 | Temp 98.0°F | Ht 59.0 in | Wt 176.0 lb

## 2021-04-10 DIAGNOSIS — R519 Headache, unspecified: Secondary | ICD-10-CM | POA: Diagnosis not present

## 2021-04-10 DIAGNOSIS — G8929 Other chronic pain: Secondary | ICD-10-CM | POA: Diagnosis not present

## 2021-04-10 DIAGNOSIS — S00412A Abrasion of left ear, initial encounter: Secondary | ICD-10-CM

## 2021-04-10 DIAGNOSIS — M545 Low back pain, unspecified: Secondary | ICD-10-CM | POA: Diagnosis not present

## 2021-04-10 MED ORDER — CYCLOBENZAPRINE HCL 5 MG PO TABS: 5.0000 mg | ORAL_TABLET | Freq: Every evening | ORAL | 0 refills | Status: DC | PRN

## 2021-04-10 NOTE — Progress Notes (Signed)
Subjective:    [name], female    DOB: 1941/05/20, 80 y.o.   MRN: [id number]  HPI  80 year old female who  has a past medical history of Adenomatous colon polyp, Anxiety, Arthritis, Breast cancer ([place]) (08/08/13), Cataract, Chronic insomnia, Cluster headaches, Depression, Diverticulosis, Fatty liver (2011), Fibromyalgia, GERD (gastroesophageal reflux disease), H/O hiatal hernia, History of transfusion (08/30/2013), radiation therapy (10/29/13- 12/14/13), Hypothyroidism, Internal hemorrhoids, Irritable bowel syndrome, Kidney stone, Lymphedema, Macular degeneration, MDD (major depressive disorder), Osteopenia, Other abnormal glucose, Other and unspecified hyperlipidemia, Pain in joint, shoulder region, Pneumonia (3846,6599), Sleep apnea, and Stress incontinence, female.  Being evaluated today for multiple issues  Headaches -Long history of chronic headaches, especially with changes in season.  Reports that over the last 2 weeks she has had almost a daily headache that has not been relieved with her prescribed Fioricet.  She went to her periodontist yesterday and was found to have a pretty significant infection, received 2 antibiotic injections.  Woke up today without a headache.  Left Ear Pain -wears waterproof earbuds while swimming.  Reports she placed a earbud in her left ear and believes that she pushed it down too far, had pain when doing so and has noticed some trace bleeding from the left ear. No pain currently   Low back pain - needs flexeril refilled. Takes this PRN   Review of Systems See HPI   Past Medical History:  Diagnosis Date  . Adenomatous colon polyp   . Anxiety    PHOBIAS  . Arthritis   . Breast cancer ([place]) 08/08/13   right LOQ  . Cataract   . Chronic insomnia   . Cluster headaches    history of migraines / NONE FOR SEVERAL YRS  . Depression   . Diverticulosis   . Fatty liver 2011  . Fibromyalgia   . GERD (gastroesophageal reflux disease)   .  H/O hiatal hernia   . History of transfusion 08/30/2013  . Hx of radiation therapy 10/29/13- 12/14/13   right chest wall 5040 cGy 28 sessions, right supraclavicular/axillary region 5040 cGy 28 sessions, right chest wall boost 1000 cGy 5 sessions  . Hypothyroidism   . Internal hemorrhoids   . Irritable bowel syndrome   . Kidney stone   . Lymphedema    RT ARM - WEARS SLEEVE  . Macular degeneration    hole/right eye  . MDD (major depressive disorder)   . Osteopenia   . Other abnormal glucose   . Other and unspecified hyperlipidemia   . Pain in joint, shoulder region   . Pneumonia 3570,1779  . Sleep apnea    USES C-PAP  . Stress incontinence, female     Social History   Socioeconomic History  . Marital status: Married    Spouse name: Not on file  . Number of children: 3  . Years of education: Not on file  . Highest education level: Not on file  Occupational History  . Occupation: retired bookkeeper  Tobacco Use  . Smoking status: Former Smoker    Packs/day: 0.10    Years: 2.00    Pack years: 0.20    Types: Cigarettes    Start date: 11/16/1959    Quit date: 11/15/1960    Years since quitting: 60.4  . Smokeless tobacco: Never Used  Vaping Use  . Vaping Use: Never used  Substance and Sexual Activity  . Alcohol use: Yes    Comment: occ  . Drug use: No  . Sexual  activity: Not on file    Comment: menarche age 4, fist live birth 44, P 3, hysterectomy age 41, no HRT, BCP 2 yrs  Other Topics Concern  . Not on file  Social History Narrative   Occupation:  Retired Radiation protection practitioner    Married with 3 grown children      Never Smoked     Alcohol use-no        Social Determinants of Radio broadcast assistant Strain: Low Risk   . Difficulty of Paying Living Expenses: Not hard at all  Food Insecurity: Not on file  Transportation Needs: No Transportation Needs  . Lack of Transportation (Medical): No  . Lack of Transportation (Non-Medical): No  Physical Activity: Sufficiently  Active  . Days of Exercise per Week: 7 days  . Minutes of Exercise per Session: 60 min  Stress: No Stress Concern Present  . Feeling of Stress : Not at all  Social Connections: Moderately Integrated  . Frequency of Communication with Friends and Family: More than three times a week  . Frequency of Social Gatherings with Friends and Family: Twice a week  . Attends Religious Services: More than 4 times per year  . Active Member of Clubs or Organizations: No  . Attends Archivist Meetings: Never  . Marital Status: Married  Human resources officer Violence: Not At Risk  . Fear of Current or Ex-Partner: No  . Emotionally Abused: No  . Physically Abused: No  . Sexually Abused: No    Past Surgical History:  Procedure Laterality Date  . ABDOMINAL HYSTERECTOMY    . APPENDECTOMY    . BILATERAL TOTAL MASTECTOMY WITH AXILLARY LYMPH NODE DISSECTION  08/30/2013   Dr Barry Dienes  . BREAST CYST ASPIRATION     9 cysts  . CATARACT EXTRACTION, BILATERAL  2005/2007  . CHOLECYSTECTOMY    . COLONOSCOPY    . CYSTOCELE REPAIR    . EVACUATION BREAST HEMATOMA Left 08/31/2013   Procedure: EVACUATION HEMATOMA BREAST;  Surgeon: Stark Klein, MD;  Location: [facility];  Service: General;  Laterality: Left;  . EYE SURGERY     to repair macular hole  . FOOT ARTHROPLASTY     lt   . GANGLION CYST EXCISION     rt foot  . HEMORRHOID SURGERY     03/1993  . JOINT REPLACEMENT  03/15/11   left knee replacement  . KNEE ARTHROSCOPY     /partial knee 2016/left knee 2012  . MASS EXCISION  11/04/2011   Procedure: EXCISION MASS;  Surgeon: Cammie Sickle., MD;  Location: [facility];  Service: Orthopedics;  Laterality: Right;  excisional biopsy right ulna mass  . MASTECTOMY W/ SENTINEL NODE BIOPSY Right 08/30/2013   Procedure: RIGHT  AXILLARY SENTINEL LYMPH NODE BIOPSY; Right Axillary Node Disection;  Surgeon: Stark Klein, MD;  Location: [facility];  Service: General;  Laterality: Right;  Right side nuc med  7:00   . PARTIAL KNEE ARTHROPLASTY Right 11/03/2015   Procedure: RIGHT KNEE MEDIAL UNICOMPARTMENTAL ARTHROPLASTY ;  Surgeon: Gaynelle Arabian, MD;  Location: [facility];  Service: Orthopedics;  Laterality: Right;  . RECTOCELE REPAIR    . SIMPLE MASTECTOMY WITH AXILLARY SENTINEL NODE BIOPSY Left 08/30/2013   Procedure: Bilateral Breast Mastectomy ;  Surgeon: Stark Klein, MD;  Location: [facility];  Service: General;  Laterality: Left;  . skin tags removed     breast, panty line, neckline  . TOE SURGERY     preventative crossover toe surg/right foot  .  TOE SURGERY  2009   left foot/screw  in 2nd toe  . TONSILLECTOMY    . UPPER GASTROINTESTINAL ENDOSCOPY      Family History  Problem Relation Age of Onset  . Stroke Mother        died age 10  . Diabetes Mother   . Breast cancer Mother 40  . Breast cancer Sister 73  . Breast cancer Paternal Aunt 75  . Diabetes Maternal Grandfather   . Breast cancer Paternal Grandmother 36  . Breast cancer Paternal Aunt        dx in her 51s  . Cancer Maternal Grandmother        intra-abdominal cancer  . Brain cancer Maternal Uncle 8  . Brain cancer Cousin 27       maternal cousin  . Brain cancer Cousin 20       paternal cousin  . Colon cancer Neg Hx     No Known Allergies  Current Outpatient Medications on File Prior to Visit  Medication Sig Dispense Refill  . butalbital-acetaminophen-caffeine (FIORICET) 50-325-40 MG tablet Take 1 tablet by mouth as needed for headache. 30 tablet 1  . Calcium Carbonate-Vitamin D (CALCIUM-VITAMIN D3 PO) Take 1 tablet by mouth in the morning and at bedtime.    . Cholecalciferol (VITAMIN D3) 50 MCG (2000 UT) TABS Take 1 tablet by mouth daily.     . diazepam (VALIUM) 5 MG tablet Take 5 mg by mouth every 6 (six) hours as needed for anxiety. As needed (flying & dental)    . esomeprazole (NEXIUM) 40 MG capsule Take 1 capsule (40 mg total) by mouth daily before breakfast. Take 1 capsule by mouth  daily 365 capsule 0  .  famotidine (PEPCID) 40 MG tablet TAKE ONE TABLET BY MOUTH AT BEDTIME 90 tablet 1  . levothyroxine (SYNTHROID) 100 MCG tablet Take 1 tablet (100 mcg total) by mouth daily. 365 tablet 0  . Lutein-Zeaxanthin 25-5 MG CAPS Take 1 capsule by mouth daily.    Marland Kitchen MELATONIN PO Take 1 tablet by mouth daily.    . simvastatin (ZOCOR) 20 MG tablet Take 1 tablet (20 mg total) by mouth at bedtime. 360 tablet 0  . sucralfate (CARAFATE) 1 g tablet Take 1 g by mouth in the morning, at noon, in the evening, and at bedtime.    . topiramate (TOPAMAX) 25 MG tablet TAKE ONE TABLET BY MOUTH DAILY 325 tablet 0  . traMADol (ULTRAM) 50 MG tablet Take 50 mg by mouth every 6 (six) hours as needed.    . venlafaxine XR (EFFEXOR-XR) 37.5 MG 24 hr capsule Take 1 capsule (37.5 mg total) by mouth daily with breakfast. 90 capsule 1  . omega-3 acid ethyl esters (LOVAZA) 1 g capsule Take 1 capsule (1 g total) by mouth 2 (two) times daily. 180 capsule 3   No current facility-administered medications on file prior to visit.    BP 130/60   Pulse 73   Temp 98 F (36.7 C) (Oral)   Ht 4\' 11"  (1.499 m)   Wt 176 lb (79.8 kg)   SpO2 95%   BMI 35.55 kg/m       Objective:   Physical Exam Vitals and nursing note reviewed.  Constitutional:      Appearance: Normal appearance.  HENT:     Head: Normocephalic and atraumatic.     Right Ear: Hearing, tympanic membrane, ear canal and external ear normal.     Left Ear: External ear normal. Laceration present. No  tenderness. Tympanic membrane is not perforated. Tympanic membrane has decreased mobility.     Ears:     Comments: Dried blood noted in the ear canal.  TM intact. Musculoskeletal:        General: Tenderness (left lower back ) present. Normal range of motion.  Skin:    General: Skin is warm and dry.     Capillary Refill: Capillary refill takes less than 2 seconds.  Neurological:     General: No focal deficit present.     Mental Status: She is alert and oriented to person,  place, and time.  Psychiatric:        Mood and Affect: Mood normal.        Behavior: Behavior normal.        Thought Content: Thought content normal.        Judgment: Judgment normal.       Assessment & Plan:  1. Chronic bilateral low back pain without sciatica  - cyclobenzaprine (FLEXERIL) 5 MG tablet; Take 1 tablet (5 mg total) by mouth at bedtime as needed for muscle spasms.  Dispense: 30 tablet; Refill: 0  2. Chronic intractable headache, unspecified headache type - Resolved. Possibly from infection  - Continue current treatment   3. Abrasion of left ear canal, initial encounter -Advised against placing earbuds in her ear until laceration heals completely.  Follow-up as needed  [name], NP

## 2021-04-24 ENCOUNTER — Encounter (HOSPITAL_BASED_OUTPATIENT_CLINIC_OR_DEPARTMENT_OTHER): Payer: Self-pay | Admitting: Emergency Medicine

## 2021-04-24 ENCOUNTER — Emergency Department (HOSPITAL_BASED_OUTPATIENT_CLINIC_OR_DEPARTMENT_OTHER)
Admission: EM | Admit: 2021-04-24 | Discharge: 2021-04-24 | Disposition: A | Discharge status: Home / Self Care | Payer: Medicare HMO | Attending: Emergency Medicine | Admitting: Emergency Medicine

## 2021-04-24 ENCOUNTER — Other Ambulatory Visit: Payer: Self-pay

## 2021-04-24 DIAGNOSIS — Z79899 Other long term (current) drug therapy: Secondary | ICD-10-CM | POA: Insufficient documentation

## 2021-04-24 DIAGNOSIS — Z96653 Presence of artificial knee joint, bilateral: Secondary | ICD-10-CM | POA: Insufficient documentation

## 2021-04-24 DIAGNOSIS — Z87891 Personal history of nicotine dependence: Secondary | ICD-10-CM | POA: Insufficient documentation

## 2021-04-24 DIAGNOSIS — M79644 Pain in right finger(s): Secondary | ICD-10-CM

## 2021-04-24 DIAGNOSIS — Z853 Personal history of malignant neoplasm of breast: Secondary | ICD-10-CM | POA: Insufficient documentation

## 2021-04-24 DIAGNOSIS — E039 Hypothyroidism, unspecified: Secondary | ICD-10-CM | POA: Diagnosis not present

## 2021-04-24 MED ORDER — OXYCODONE-ACETAMINOPHEN 5-325 MG PO TABS
1.0000 | ORAL_TABLET | Freq: Once | ORAL | Status: AC
Start: 2021-04-24 — End: 2021-04-24
  Administered 2021-04-24: 1 via ORAL
  Filled 2021-04-24: qty 1

## 2021-04-24 MED ORDER — OXYCODONE-ACETAMINOPHEN 5-325 MG PO TABS: 0.5000 | ORAL_TABLET | Freq: Four times a day (QID) | ORAL | 0 refills | Status: DC | PRN

## 2021-04-24 NOTE — ED Provider Notes (Signed)
[facility] DEPT [facility] Provider Note: Jean Spurling, MD, FACEP  CSN: [id number] MRN: [id number] ARRIVAL: 04/24/21 at [place]: [place]  Hand Pain   HISTORY OF PRESENT ILLNESS  04/24/21 5:42 AM Jean Nash is a 80 y.o. female who has chronic osteoarthritis of the hands.  Over the past 2 days she has developed severe pain in her left thumb from the MCP joint distally.  She rates it as a 10 out of 10.  It is worse with movement or palpation.  She denies any injury.  There is no associated erythema or warmth.  She has taken tramadol without relief.  She has never had pain this severe.  She is not able to take NSAIDs due to history of GI bleed.   Past Medical History:  Diagnosis Date   Adenomatous colon polyp    Anxiety    PHOBIAS   Arthritis    Breast cancer ([place]) 08/08/13   right LOQ   Cataract    Chronic insomnia    Cluster headaches    history of migraines / NONE FOR SEVERAL YRS   Depression    Diverticulosis    Fatty liver 2011   Fibromyalgia    GERD (gastroesophageal reflux disease)    H/O hiatal hernia    History of transfusion 08/30/2013   Hx of radiation therapy 10/29/13- 12/14/13   right chest wall 5040 cGy 28 sessions, right supraclavicular/axillary region 5040 cGy 28 sessions, right chest wall boost 1000 cGy 5 sessions   Hypothyroidism    Internal hemorrhoids    Irritable bowel syndrome    Kidney stone    Lymphedema    RT ARM - WEARS SLEEVE   Macular degeneration    hole/right eye   MDD (major depressive disorder)    Osteopenia    Other abnormal glucose    Other and unspecified hyperlipidemia    Pain in joint, shoulder region    Pneumonia 2119,4174   Sleep apnea    USES C-PAP   Stress incontinence, female     Past Surgical History:  Procedure Laterality Date   ABDOMINAL HYSTERECTOMY     APPENDECTOMY     BILATERAL TOTAL MASTECTOMY WITH AXILLARY LYMPH NODE DISSECTION  08/30/2013   Dr Barry Dienes   BREAST CYST ASPIRATION     9  cysts   CATARACT EXTRACTION, BILATERAL  2005/2007   CHOLECYSTECTOMY     COLONOSCOPY     CYSTOCELE REPAIR     EVACUATION BREAST HEMATOMA Left 08/31/2013   Procedure: EVACUATION HEMATOMA BREAST;  Surgeon: Stark Klein, MD;  Location: [facility];  Service: General;  Laterality: Left;   EYE SURGERY     to repair macular hole   FOOT ARTHROPLASTY     lt    GANGLION CYST EXCISION     rt foot   HEMORRHOID SURGERY     03/1993   JOINT REPLACEMENT  03/15/11   left knee replacement   KNEE ARTHROSCOPY     /partial knee 2016/left knee 2012   MASS EXCISION  11/04/2011   Procedure: EXCISION MASS;  Surgeon: Cammie Sickle., MD;  Location: [facility];  Service: Orthopedics;  Laterality: Right;  excisional biopsy right ulna mass   MASTECTOMY W/ SENTINEL NODE BIOPSY Right 08/30/2013   Procedure: RIGHT  AXILLARY SENTINEL LYMPH NODE BIOPSY; Right Axillary Node Disection;  Surgeon: Stark Klein, MD;  Location: [facility];  Service: General;  Laterality: Right;  Right side nuc med 7:00  PARTIAL KNEE ARTHROPLASTY Right 11/03/2015   Procedure: RIGHT KNEE MEDIAL UNICOMPARTMENTAL ARTHROPLASTY ;  Surgeon: Gaynelle Arabian, MD;  Location[facility];  Service: Orthopedics;  Laterality: Right;   RECTOCELE REPAIR     SIMPLE MASTECTOMY WITH AXILLARY SENTINEL NODE BIOPSY Left 08/30/2013   Procedure: Bilateral Breast Mastectomy ;  Surgeon: Stark Klein, MD;  Location: [facility];  Service: General;  Laterality: Left;   skin tags removed     breast, panty line, neckline   TOE SURGERY     preventative crossover toe surg/right foot   TOE SURGERY  2009   left foot/screw  in 2nd toe   TONSILLECTOMY     UPPER GASTROINTESTINAL ENDOSCOPY      Family History  Problem Relation Age of Onset   Stroke Mother        died age 35   Diabetes Mother    Breast cancer Mother 62   Breast cancer Sister 42   Breast cancer Paternal Aunt 73   Diabetes Maternal Grandfather    Breast cancer Paternal Grandmother 55   Breast cancer  Paternal Aunt        dx in her 58s   Cancer Maternal Grandmother        intra-abdominal cancer   Brain cancer Maternal Uncle 82   Brain cancer Cousin 54       maternal cousin   Brain cancer Cousin 56       paternal cousin   Colon cancer Neg Hx     Social History   Tobacco Use   Smoking status: Former    Packs/day: 0.10    Years: 2.00    Pack years: 0.20    Types: Cigarettes    Start date: 11/16/1959    Quit date: 11/15/1960    Years since quitting: 60.4   Smokeless tobacco: Never  Vaping Use   Vaping Use: Never used  Substance Use Topics   Alcohol use: Yes    Comment: occ   Drug use: No    Prior to Admission medications   Medication Sig Start Date End Date Taking? Authorizing Provider  butalbital-acetaminophen-caffeine (FIORICET) [phone] MG tablet Take 1 tablet by mouth as needed for headache. 03/06/21   [name], NP  Calcium Carbonate-Vitamin D (CALCIUM-VITAMIN D3 PO) Take 1 tablet by mouth in the morning and at bedtime.    [provider]  Cholecalciferol (VITAMIN D3) 50 MCG (2000 UT) TABS Take 1 tablet by mouth daily.     [provider]  cyclobenzaprine (FLEXERIL) 5 MG tablet Take 1 tablet (5 mg total) by mouth at bedtime as needed for muscle spasms. 04/10/21   Nafziger, Tommi Rumps, NP  diazepam (VALIUM) 5 MG tablet Take 5 mg by mouth every 6 (six) hours as needed for anxiety. As needed (flying & dental)    [provider]  esomeprazole (NEXIUM) 40 MG capsule Take 1 capsule (40 mg total) by mouth daily before breakfast. Take 1 capsule by mouth  daily 02/24/21   Nafziger, Tommi Rumps, NP  famotidine (PEPCID) 40 MG tablet TAKE ONE TABLET BY MOUTH AT BEDTIME 10/27/20   Esterwood, Amy S, PA-C  levothyroxine (SYNTHROID) 100 MCG tablet Take 1 tablet (100 mcg total) by mouth daily. 05/20/20 05/20/21  Nafziger, Tommi Rumps, NP  Lutein-Zeaxanthin 25-5 MG CAPS Take 1 capsule by mouth daily.    [provider]  MELATONIN PO Take 1 tablet by mouth daily.    [provider]  omega-3 acid ethyl esters (LOVAZA) 1 g capsule Take 1  capsule (1 g total) by mouth 2 (two) times daily. 06/19/20 09/17/20  Nafziger, Tommi Rumps, NP  oxyCODONE-acetaminophen (PERCOCET) 5-325 MG tablet Take 0.5-1 tablets by mouth every 6 (six) hours as needed for severe pain (may cause constipation; do not take concurrently with tramadol or Fioricet). 04/24/21   Briele Lagasse, MD  simvastatin (ZOCOR) 20 MG tablet Take 1 tablet (20 mg total) by mouth at bedtime. 03/10/21   Nafziger, Tommi Rumps, NP  sucralfate (CARAFATE) 1 g tablet Take 1 g by mouth in the morning, at noon, in the evening, and at bedtime.    [provider]  topiramate (TOPAMAX) 25 MG tablet TAKE ONE TABLET BY MOUTH DAILY 02/16/21   Nafziger, Tommi Rumps, NP  traMADol (ULTRAM) 50 MG tablet Take 50 mg by mouth every 6 (six) hours as needed.    [provider]  venlafaxine XR (EFFEXOR-XR) 37.5 MG 24 hr capsule Take 1 capsule (37.5 mg total) by mouth daily with breakfast. 11/25/20   [name], NP    Allergies Patient has no known allergies.   REVIEW OF SYSTEMS  Negative except as noted here or in the History of Present Illness.   PHYSICAL EXAMINATION  Initial Vital Signs Blood pressure (!) 145/78, pulse 73, temperature 97.9 F (36.6 C), temperature source Oral, resp. rate 18, height 4\' 11"  (1.499 m), weight 80 kg, SpO2 97 %.  Examination General: Well-developed, well-nourished female in no acute distress; appearance consistent with age of record HENT: normocephalic; atraumatic Eyes: pupils equal, round and reactive to light; extraocular muscles intact Neck: supple Heart: regular rate and rhythm Lungs: clear to auscultation bilaterally Abdomen: soft; nondistended; nontender; bowel sounds present Extremities: Arthritic changes; significant tenderness of left thumb from the MCP joint distally without erythema or warmth, no wrist tenderness to suggest [employer] Neurologic: Awake, alert and oriented; motor function  intact in all extremities and symmetric; no facial droop Skin: Warm and dry Psychiatric: Normal mood and affect   RESULTS  Summary of this visit's results, reviewed and interpreted by myself:   EKG Interpretation  Date/Time:    Ventricular Rate:    PR Interval:    QRS Duration:   QT Interval:    QTC Calculation:   R Axis:     Text Interpretation:          Laboratory Studies: No results found. However, due to the size of the patient record, not all encounters were searched. Please check Results Review for a complete set of results. Imaging Studies: No results found.  ED COURSE and MDM  Nursing notes, initial and subsequent vitals signs, including pulse oximetry, reviewed and interpreted by myself.  Vitals:   04/24/21 0525 04/24/21 0527  BP: (!) 145/78   Pulse: 73   Resp: 18   Temp: 97.9 F (36.6 C)   TempSrc: Oral   SpO2: 97%   Weight:  80 kg  Height:  4\' 11"  (1.499 m)   Medications  oxyCODONE-acetaminophen (PERCOCET/ROXICET) 5-325 MG per tablet 1 tablet (has no administration in time range)    We will try a short course of low-dose oxycodone and refer to hand surgery.  She may benefit from a cortisone injection.  She has no history of gout.  PROCEDURES  Procedures   ED DIAGNOSES     ICD-10-CM   1. Pain of right thumb  M79.644          Yamin Swingler, Jenny Reichmann, MD 04/24/21 [phone]

## 2021-04-24 NOTE — ED Triage Notes (Signed)
Pt c/o right hand pain which is chronic but is exacerbated this morning. Pt took tramadol at 0300 without relief

## 2021-04-29 ENCOUNTER — Other Ambulatory Visit: Payer: Self-pay | Admitting: Physician Assistant

## 2021-05-07 ENCOUNTER — Encounter: Payer: Self-pay | Admitting: Internal Medicine

## 2021-05-07 NOTE — Assessment & Plan Note (Signed)
She improved with weight loss. She and husband both minimize issues now. Plan- cautioned not to regain weight.

## 2021-05-07 NOTE — Assessment & Plan Note (Signed)
Mild dry cough. No progressive dyspnea.  Plan- conservative. Update imaging if needed in future.

## 2021-05-08 ENCOUNTER — Telehealth: Payer: Self-pay | Admitting: Pharmacist

## 2021-05-08 NOTE — Chronic Care Management (AMB) (Signed)
    Chronic Care Management Pharmacy Assistant   Name: [name]  MRN: [id number] DOB: 08-18-1941  05/08/2021- Sending message to Patient Care Embedded team to discuss CCM Services and check eligibility for initial scheduling with Jeni Salles, CPP.   Pattricia Boss, [place] Pharmacist Assistant [phone]

## 2021-05-14 ENCOUNTER — Encounter: Payer: Self-pay | Admitting: Adult Health

## 2021-05-16 ENCOUNTER — Other Ambulatory Visit: Payer: Self-pay | Admitting: Adult Health

## 2021-05-19 ENCOUNTER — Ambulatory Visit (INDEPENDENT_AMBULATORY_CARE_PROVIDER_SITE_OTHER): Payer: Medicare HMO | Admitting: Family Medicine

## 2021-05-19 ENCOUNTER — Other Ambulatory Visit: Payer: Self-pay

## 2021-05-19 ENCOUNTER — Encounter: Payer: Self-pay | Admitting: Family Medicine

## 2021-05-19 VITALS — BP 138/84 | HR 70 | Temp 98.2°F | Wt 174.6 lb

## 2021-05-19 DIAGNOSIS — R229 Localized swelling, mass and lump, unspecified: Secondary | ICD-10-CM

## 2021-05-19 DIAGNOSIS — R030 Elevated blood-pressure reading, without diagnosis of hypertension: Secondary | ICD-10-CM | POA: Diagnosis not present

## 2021-05-19 NOTE — Progress Notes (Signed)
Established Patient Office Visit  Subjective:  [name], female    DOB: May 31, 1941  Age: 80 y.o. MRN: [id number]  CC:  Chief Complaint  Patient presents with   Breast Problem    Left     HPI Jean Nash presents for recent transient "lump "left anterior chest wall which is apparently now resolved.  Her history is that she has had breast cancer of the right back at age 27 and ended up getting bilateral mastectomies.  Left side was prophylactic.  With recent lump she did not notice any redness or warmth.  This was non-tender.  Patient did not actually see this but could feel some swelling.  This went away after transient swelling for couple days.  No recent fevers or chills.  Appetite is good.  She does have some lymphedema involving the right upper extremity and has a machine to help control that.  No recent injury.  Past Medical History:  Diagnosis Date   Adenomatous colon polyp    Anxiety    PHOBIAS   Arthritis    Breast cancer (HCC) 08/08/13   right LOQ   Cataract    Chronic insomnia    Cluster headaches    history of migraines / NONE FOR SEVERAL YRS   Depression    Diverticulosis    Fatty liver 2011   Fibromyalgia    GERD (gastroesophageal reflux disease)    H/O hiatal hernia    History of transfusion 08/30/2013   Hx of radiation therapy 10/29/13- 12/14/13   right chest wall 5040 cGy 28 sessions, right supraclavicular/axillary region 5040 cGy 28 sessions, right chest wall boost 1000 cGy 5 sessions   Hypothyroidism    Internal hemorrhoids    Irritable bowel syndrome    Kidney stone    Lymphedema    RT ARM - WEARS SLEEVE   Macular degeneration    hole/right eye   MDD (major depressive disorder)    Osteopenia    Other abnormal glucose    Other and unspecified hyperlipidemia    Pain in joint, shoulder region    Pneumonia [id number],[id number]   Sleep apnea    USES C-PAP   Stress incontinence, female     Past Surgical History:  Procedure Laterality Date    ABDOMINAL HYSTERECTOMY     APPENDECTOMY     BILATERAL TOTAL MASTECTOMY WITH AXILLARY LYMPH NODE DISSECTION  08/30/2013   Dr Donell Beers   BREAST CYST ASPIRATION     9 cysts   CATARACT EXTRACTION, BILATERAL  2005/2007   CHOLECYSTECTOMY     COLONOSCOPY     CYSTOCELE REPAIR     EVACUATION BREAST HEMATOMA Left 08/31/2013   Procedure: EVACUATION HEMATOMA BREAST;  Surgeon: Almond Lint, MD;  Location: [facility];  Service: General;  Laterality: Left;   EYE SURGERY     to repair macular hole   FOOT ARTHROPLASTY     lt    GANGLION CYST EXCISION     rt foot   HEMORRHOID SURGERY     03/1993   JOINT REPLACEMENT  03/15/11   left knee replacement   KNEE ARTHROSCOPY     /partial knee 2016/left knee 2012   MASS EXCISION  11/04/2011   Procedure: EXCISION MASS;  Surgeon: Wyn Forster., MD;  Location: [facility];  Service: Orthopedics;  Laterality: Right;  excisional biopsy right ulna mass   MASTECTOMY W/ SENTINEL NODE BIOPSY Right 08/30/2013   Procedure: RIGHT  AXILLARY SENTINEL LYMPH NODE BIOPSY; Right  Axillary Node Disection;  Surgeon: Stark Klein, MD;  Location: [facility];  Service: General;  Laterality: Right;  Right side nuc med 7:00    PARTIAL KNEE ARTHROPLASTY Right 11/03/2015   Procedure: RIGHT KNEE MEDIAL UNICOMPARTMENTAL ARTHROPLASTY ;  Surgeon: Gaynelle Arabian, MD;  Location: [facility];  Service: Orthopedics;  Laterality: Right;   RECTOCELE REPAIR     SIMPLE MASTECTOMY WITH AXILLARY SENTINEL NODE BIOPSY Left 08/30/2013   Procedure: Bilateral Breast Mastectomy ;  Surgeon: Stark Klein, MD;  Location: [facility];  Service: General;  Laterality: Left;   skin tags removed     breast, panty line, neckline   TOE SURGERY     preventative crossover toe surg/right foot   TOE SURGERY  2009   left foot/screw  in 2nd toe   TONSILLECTOMY     UPPER GASTROINTESTINAL ENDOSCOPY      Family History  Problem Relation Age of Onset   Stroke Mother        died age 32   Diabetes Mother    Breast  cancer Mother 71   Breast cancer Sister 84   Breast cancer Paternal Aunt 74   Diabetes Maternal Grandfather    Breast cancer Paternal Grandmother 26   Breast cancer Paternal Aunt        dx in her 61s   Cancer Maternal Grandmother        intra-abdominal cancer   Brain cancer Maternal Uncle 14   Brain cancer Cousin 37       maternal cousin   Brain cancer Cousin 56       paternal cousin   Colon cancer Neg Hx     Social History   Socioeconomic History   Marital status: Married    Spouse name: Not on file   Number of children: 3   Years of education: Not on file   Highest education level: Not on file  Occupational History   Occupation: retired bookkeeper  Tobacco Use   Smoking status: Former    Packs/day: 0.10    Years: 2.00    Pack years: 0.20    Types: Cigarettes    Start date: 11/16/1959    Quit date: 11/15/1960    Years since quitting: 60.5   Smokeless tobacco: Never  Vaping Use   Vaping Use: Never used  Substance and Sexual Activity   Alcohol use: Yes    Comment: occ   Drug use: No   Sexual activity: Not on file    Comment: menarche age 42, fist live birth 43, P 3, hysterectomy age 51, no HRT, BCP 2 yrs  Other Topics Concern   Not on file  Social History Narrative   Occupation:  Retired Radiation protection practitioner    Married with 3 grown children      Never Smoked     Alcohol use-no        Social Determinants of Radio broadcast assistant Strain: Not on file  Food Insecurity: Not on file  Transportation Needs: No Transportation Needs   Lack of Transportation (Medical): No   Lack of Transportation (Non-Medical): No  Physical Activity: Sufficiently Active   Days of Exercise per Week: 7 days   Minutes of Exercise per Session: 60 min  Stress: No Stress Concern Present   Feeling of Stress : Not at all  Social Connections: Moderately Integrated   Frequency of Communication with Friends and Family: More than three times a week   Frequency of Social Gatherings with Friends and  Family: Twice  a week   Attends Religious Services: More than 4 times per year   Active Member of Clubs or Organizations: No   Attends Banker Meetings: Never   Marital Status: Married  Catering manager Violence: Not At Risk   Fear of Current or Ex-Partner: No   Emotionally Abused: No   Physically Abused: No   Sexually Abused: No    Outpatient Medications Prior to Visit  Medication Sig Dispense Refill   butalbital-acetaminophen-caffeine (FIORICET) 50-325-40 MG tablet Take 1 tablet by mouth as needed for headache. 30 tablet 1   Calcium Carbonate-Vitamin D (CALCIUM-VITAMIN D3 PO) Take 1 tablet by mouth in the morning and at bedtime.     Cholecalciferol (VITAMIN D3) 50 MCG (2000 UT) TABS Take 1 tablet by mouth daily.      cyclobenzaprine (FLEXERIL) 5 MG tablet Take 1 tablet (5 mg total) by mouth at bedtime as needed for muscle spasms. 30 tablet 0   diazepam (VALIUM) 5 MG tablet Take 5 mg by mouth every 6 (six) hours as needed for anxiety. As needed (flying & dental)     esomeprazole (NEXIUM) 40 MG capsule Take 1 capsule (40 mg total) by mouth daily before breakfast. Take 1 capsule by mouth  daily 365 capsule 0   famotidine (PEPCID) 40 MG tablet TAKE ONE TABLET BY MOUTH EVERY NIGHT AT BEDTIME 90 tablet 1   levothyroxine (SYNTHROID) 100 MCG tablet TAKE ONE TABLET BY MOUTH DAILY 365 tablet 0   Lutein-Zeaxanthin 25-5 MG CAPS Take 1 capsule by mouth daily.     MELATONIN PO Take 1 tablet by mouth daily.     oxyCODONE-acetaminophen (PERCOCET) 5-325 MG tablet Take 0.5-1 tablets by mouth every 6 (six) hours as needed for severe pain (may cause constipation; do not take concurrently with tramadol or Fioricet). 10 tablet 0   simvastatin (ZOCOR) 20 MG tablet Take 1 tablet (20 mg total) by mouth at bedtime. 360 tablet 0   sucralfate (CARAFATE) 1 g tablet Take 1 g by mouth in the morning, at noon, in the evening, and at bedtime.     topiramate (TOPAMAX) 25 MG tablet TAKE ONE TABLET BY MOUTH  DAILY 325 tablet 0   traMADol (ULTRAM) 50 MG tablet Take 50 mg by mouth every 6 (six) hours as needed.     venlafaxine XR (EFFEXOR-XR) 37.5 MG 24 hr capsule Take 1 capsule (37.5 mg total) by mouth daily with breakfast. 90 capsule 1   omega-3 acid ethyl esters (LOVAZA) 1 g capsule Take 1 capsule (1 g total) by mouth 2 (two) times daily. 180 capsule 3   No facility-administered medications prior to visit.    No Known Allergies  ROS Review of Systems  Constitutional:  Negative for appetite change, chills, fever and unexpected weight change.  Respiratory:  Negative for shortness of breath.   Cardiovascular:  Negative for chest pain.  Gastrointestinal:  Negative for abdominal pain.  Skin:  Negative for rash.  Hematological:  Negative for adenopathy.     Objective:    Physical Exam Vitals reviewed.  Cardiovascular:     Rate and Rhythm: Normal rate and regular rhythm.  Pulmonary:     Effort: Pulmonary effort is normal.     Breath sounds: Normal breath sounds.     Comments: She had bilateral mastectomies.  She has scars from previous surgery.  Left chest wall reveals no erythema.  No warmth.  No tenderness.  No masses.  No left axillary masses or adenopathy noted Neurological:  Mental Status: She is alert.    BP (!) 140/98 (BP Location: Left Arm, Patient Position: Sitting, Cuff Size: Normal)   Pulse 70   Temp 98.2 F (36.8 C) (Oral)   Wt 174 lb 9.6 oz (79.2 kg)   SpO2 98%   BMI 35.26 kg/m  Wt Readings from Last 3 Encounters:  05/19/21 174 lb 9.6 oz (79.2 kg)  04/24/21 176 lb 5.9 oz (80 kg)  04/10/21 176 lb (79.8 kg)     Health Maintenance Due  Topic Date Due   COVID-19 Vaccine (4 - Booster for Pfizer series) 11/18/2020    There are no preventive care reminders to display for this patient.  Lab Results  Component Value Date   TSH 2.24 03/06/2021   Lab Results  Component Value Date   WBC 6.6 03/06/2021   HGB 15.5 (H) 03/06/2021   HCT 46.7 (H) 03/06/2021   MCV  88.5 03/06/2021   PLT 185.0 03/06/2021   Lab Results  Component Value Date   NA 140 03/06/2021   K 4.6 03/06/2021   CHLORIDE 107 09/01/2017   CO2 27 03/06/2021   GLUCOSE 122 (H) 03/06/2021   BUN 17 03/06/2021   CREATININE 0.75 03/06/2021   BILITOT 0.7 03/06/2021   ALKPHOS 76 03/06/2021   AST 17 03/06/2021   ALT 27 03/06/2021   PROT 6.5 03/06/2021   ALBUMIN 4.3 03/06/2021   CALCIUM 9.5 03/06/2021   ANIONGAP 12 04/09/2020   EGFR >60 09/01/2017   GFR 76.53 03/06/2021   Lab Results  Component Value Date   CHOL 209 (H) 03/06/2021   Lab Results  Component Value Date   HDL 51.10 03/06/2021   Lab Results  Component Value Date   LDLCALC 138 (H) 03/06/2021   Lab Results  Component Value Date   TRIG 101.0 03/06/2021   Lab Results  Component Value Date   CHOLHDL 4 03/06/2021   Lab Results  Component Value Date   HGBA1C 5.4 10/05/2019      Assessment & Plan:   #1 patient describes recent transient left chest wall swelling.  No mass noted today.  The fact this resolved so quickly would not suggest likely abscess.  There is also no reported tenderness or warmth or redness.  She has had some lymphedema issues in the past predominantly of the right arm and has used a compression wrap that she has had previously for that.  Follow-up for any recurrent swelling or other concerns otherwise observe for now  #2 patient had initially elevated blood pressure 140/98.  Repeat left arm seated large cuff after rest 138/84 -Work on weight loss and observe for now    Follow-up: No follow-ups on file.    Carolann Littler, MD

## 2021-05-19 NOTE — Patient Instructions (Signed)
Follow up for any recurrent chest wall swelling.

## 2021-05-21 ENCOUNTER — Encounter: Payer: Self-pay | Admitting: Adult Health

## 2021-06-09 ENCOUNTER — Other Ambulatory Visit: Payer: Self-pay | Admitting: Adult Health

## 2021-06-10 ENCOUNTER — Telehealth: Payer: Self-pay | Admitting: Pharmacist

## 2021-06-10 NOTE — Chronic Care Management (AMB) (Signed)
Chronic Care Management Pharmacy Assistant   Name: Jean Nash  MRN: [id number] DOB: [birth year]    Reason for Encounter: Disease State General Assessment Call Per Jeni Salles Clinical Pharmacist   Conditions to be addressed/monitored: Hyperlipidemia  Recent office visits:   05-19-2021 Eulas Post, MD (PCP) - Patient presented for lump on breast and other issues. No medication changes.  Recent consult visits:  None  Hospital visits:  Medication Reconciliation was completed by comparing discharge summary, patient's EMR and Pharmacy list, and upon discussion with patient.  Patient visited [facility] ED on 04-24-2021  due to right thumb pain. Patient spent 1 hour in the ED.   New?Medications Started at [facility] Discharge:?? -started Prescribed Oxycodone-acetaminophen 5-'325mg'$  PRN  Medication Changes at Hospital Discharge: -Changed None  Medications Discontinued at Hospital Discharge: -Stopped None  Medications that remain the same after Hospital Discharge:??  -All other medications will remain the same.    Medications: Outpatient Encounter Medications as of 06/10/2021  Medication Sig Note   butalbital-acetaminophen-caffeine (FIORICET) 50-325-40 MG tablet Take 1 tablet by mouth as needed for headache.    Calcium Carbonate-Vitamin D (CALCIUM-VITAMIN D3 PO) Take 1 tablet by mouth in the morning and at bedtime. 05/16/2020: 200 units/ 65mg tablet   Cholecalciferol (VITAMIN D3) 50 MCG (2000 UT) TABS Take 1 tablet by mouth daily.     cyclobenzaprine (FLEXERIL) 5 MG tablet Take 1 tablet (5 mg total) by mouth at bedtime as needed for muscle spasms.    diazepam (VALIUM) 5 MG tablet Take 5 mg by mouth every 6 (six) hours as needed for anxiety. As needed (flying & dental)    esomeprazole (NEXIUM) 40 MG capsule Take 1 capsule (40 mg total) by mouth daily before breakfast. Take 1 capsule by mouth  daily    famotidine (PEPCID) 40 MG tablet TAKE ONE TABLET BY MOUTH EVERY  NIGHT AT BEDTIME    levothyroxine (SYNTHROID) 100 MCG tablet TAKE ONE TABLET BY MOUTH DAILY    Lutein-Zeaxanthin 25-5 MG CAPS Take 1 capsule by mouth daily.    MELATONIN PO Take 1 tablet by mouth daily.    omega-3 acid ethyl esters (LOVAZA) 1 g capsule Take 1 capsule (1 g total) by mouth 2 (two) times daily.    oxyCODONE-acetaminophen (PERCOCET) 5-325 MG tablet Take 0.5-1 tablets by mouth every 6 (six) hours as needed for severe pain (may cause constipation; do not take concurrently with tramadol or Fioricet).    simvastatin (ZOCOR) 20 MG tablet Take 1 tablet (20 mg total) by mouth at bedtime.    sucralfate (CARAFATE) 1 g tablet Take 1 g by mouth in the morning, at noon, in the evening, and at bedtime.    topiramate (TOPAMAX) 25 MG tablet TAKE ONE TABLET BY MOUTH DAILY    traMADol (ULTRAM) 50 MG tablet Take 50 mg by mouth every 6 (six) hours as needed.    venlafaxine XR (EFFEXOR-XR) 37.5 MG 24 hr capsule TAKE ONE CAPSULE BY MOUTH DAILY WITH BREAKFAST    No facility-administered encounter medications on file as of 06/10/2021.   06/10/2021 Name: Jean SpillerMRN: [id number]: [birth year]VAllyce Arcandis a 80y.o. year old female who is a primary care patient of [name] NP.  Comprehensive medication review performed; Spoke to patient regarding cholesterol  Lipid Panel    Component Value Date/Time   CHOL 209 (H) 03/06/2021 1037   TRIG 101.0 03/06/2021 1037   HDL 51.10 03/06/2021 1037   LDLCALC 138 (H) 03/06/2021 1037  LDLDIRECT 113.0 05/17/2016 0749    10-year ASCVD risk score: The 10-year ASCVD risk score Mikey Bussing DC Jr., et al., 2013) is: 24.6%   Values used to calculate the score:     Age: 80 years     Sex: Female     Is Non-Hispanic African American: No     Diabetic: No     Tobacco smoker: No     Systolic Blood Pressure: 0000000 mmHg     Is BP treated: No     HDL Cholesterol: 51.1 mg/dL     Total Cholesterol: 209 mg/dL  Current antihyperlipidemic regimen:  Simvastatin  '20mg'$  take one tablet at bedtime daily Previous antihyperlipidemic medications tried: Lovaza 1g ASCVD risk enhancing conditions: age >26 What recent interventions/DTPs have been made by any provider to improve Cholesterol control since last CPP Visit: Patient reports none Any recent hospitalizations or ED visits since last visit with CPP? Yes What exercise is being done to improve Cholesterol?  Patient has pool she often does waling laps in and does get around well indoors when she is not having pain, her hand and arthritis has been bothering her lately. Patient reports she drinks 2-3L of water a day  Adherence Review: Does the patient have >5 day gap between last estimated fill dates? No  Notes:  Call to patient today spoke with her and her husband on the line. Patient reports several concerns -Upon waking this morning patient reported she had very dark black and brown vaginal discharge that she has never noticed before -Her arthritis has been extremely painful in her joints and hands, the brace she was given is not helping she is always in pain -Has had a constant headache for the past three months to date has had no relief from pain medication (Butalbital) thinks they are seasonally onset - Reports she is out of her Sucralfate and believes the script is expired but was a medication that was truly helping her and she would like to refill. CPP aware  Care Gaps:  CCM follow up visit- Scheduled today for 06-17-21 at 2:30 in office. AWV no past or future visit  - MSG sent to Ramond Craver CMA to schedule.  Star Rating Drugs:  Simvastatin '20mg'$  - Last filled 03-10-2021 365DS  [facility] per Husband (used [facility])   [facility] Clinical Pharmacist Assistant [phone]

## 2021-06-16 ENCOUNTER — Telehealth: Payer: Self-pay | Admitting: Pharmacist

## 2021-06-16 NOTE — Progress Notes (Signed)
Office Visit Note  Patient: Jean Nash             Date of Birth: 03-09-1941           MRN: [id number]             PCP: [name], NP Referring: [name], NP Visit Date: 06/30/2021 Occupation: '@GUAROCC'$ @  Subjective:  Left hand pain and lower back pain   History of Present Illness: Jean Nash is a 80 y.o. female with a history of osteoarthritis, degenerative disc disease and fibromyalgia syndrome.  According to her about 2 months ago she woke up with severe pain and discomfort in her left hand.  She went to the emergency room and was told to go to the orthopedic surgeon.  She was evaluated by Dr. Apolonio Schneiders who diagnosed her with left [facility] joint arthritis and gave her a cortisone injection in the [facility] joint.  She had repeat injection in August 2022 without much relief.  She has been using a CMC brace.  He also discussed surgery for left CMC joint.  She continues to have lower back pain.  She had been to physical therapy without much help.  Her both knees are replaced and there is still painful.  Activities of Daily Living:  Patient reports morning stiffness for all day .   Patient Reports nocturnal pain.  Difficulty dressing/grooming: Denies Difficulty climbing stairs: Reports Difficulty getting out of chair: Reports Difficulty using hands for taps, buttons, cutlery, and/or writing: Reports  Review of Systems  Constitutional:  Positive for fatigue. Negative for night sweats, weight gain and weight loss.  HENT:  Negative for mouth sores, trouble swallowing, trouble swallowing, mouth dryness and nose dryness.   Eyes:  Negative for pain, redness, visual disturbance and dryness.  Respiratory:  Negative for cough, shortness of breath and difficulty breathing.   Cardiovascular:  Negative for chest pain, palpitations, hypertension, irregular heartbeat and swelling in legs/feet.  Gastrointestinal:  Positive for constipation and diarrhea. Negative for blood in stool.  Endocrine:  Negative for increased urination.  Genitourinary:  Positive for involuntary urination and nocturia. Negative for vaginal dryness.  Musculoskeletal:  Positive for joint pain, joint pain, myalgias, morning stiffness and myalgias. Negative for joint swelling, muscle weakness and muscle tenderness.  Skin:  Negative for color change, rash, hair loss, skin tightness, ulcers and sensitivity to sunlight.  Allergic/Immunologic: Negative for susceptible to infections.  Neurological:  Negative for dizziness, memory loss, night sweats and weakness.  Hematological:  Negative for swollen glands.  Psychiatric/Behavioral:  Positive for sleep disturbance. Negative for depressed mood. The patient is not nervous/anxious.    PMFS History:  Patient Active Problem List   Diagnosis Date Noted   Genetic testing 08/05/2020   Fibromyalgia    MDD (major depressive disorder), recurrent, in full remission ([place]) 12/10/2019   Pulmonary fibrosis ([place]) 12/04/2018   MDD (major depressive disorder), recurrent episode, mild ([place]) 12/22/2017   Osteoporosis 07/14/2017   OA (osteoarthritis) of knee 11/03/2015   Osteopenia 08/08/2015   Headache 02/03/2015   Atypical chest pain 07/15/2014   Arthralgia 02/22/2014   Psoriasis 02/22/2014   Malignant neoplasm of lower-outer quadrant of right breast of female, estrogen receptor positive ([place]) 08/09/2013   Neck pain 06/22/2013   Left knee pain 11/29/2012   Reactive depression (situational) 04/26/2012   Right wrist pain 02/02/2012   Right foot pain 10/04/2011   Nevus 06/05/2011   Status post total knee replacement 04/06/2011   Overactive bladder 03/14/2011   KNEE PAIN,  LEFT 08/31/2010   HEARING LOSS 08/17/2010   HIRSUTISM 06/16/2009   CYST, IDIOPATHIC 05/20/2008   IRRITABLE BOWEL SYNDROME 04/15/2008   Allergic asthma, mild intermittent, uncomplicated [id number]   GERD 03/19/2008   COLONIC POLYPS, HX OF 03/19/2008   NEPHROLITHIASIS, HX OF 03/19/2008   Hypothyroidism  03/18/2008   Hyperlipidemia 03/18/2008   INSOMNIA, CHRONIC 03/18/2008   FIBROMYALGIA 03/18/2008   Prediabetes 03/18/2008    Past Medical History:  Diagnosis Date   Adenomatous colon polyp    Anxiety    PHOBIAS   Arthritis    Breast cancer ([place]) 08/08/13   right LOQ   Cataract    Chronic insomnia    Cluster headaches    history of migraines / NONE FOR SEVERAL YRS   Depression    Diverticulosis    Fatty liver 2011   Fibromyalgia    GERD (gastroesophageal reflux disease)    H/O hiatal hernia    History of transfusion 08/30/2013   Hx of radiation therapy 10/29/13- 12/14/13   right chest wall 5040 cGy 28 sessions, right supraclavicular/axillary region 5040 cGy 28 sessions, right chest wall boost 1000 cGy 5 sessions   Hypothyroidism    Internal hemorrhoids    Irritable bowel syndrome    Kidney stone    Lymphedema    RT ARM - WEARS SLEEVE   Macular degeneration    hole/right eye   MDD (major depressive disorder)    Osteopenia    Other abnormal glucose    Other and unspecified hyperlipidemia    Pain in joint, shoulder region    Pneumonia [id number]   Sleep apnea    USES C-PAP   Stress incontinence, female     Family History  Problem Relation Age of Onset   Stroke Mother        died age 90   Diabetes Mother    Breast cancer Mother 58   Breast cancer Sister 16   Breast cancer Paternal Aunt 47   Diabetes Maternal Grandfather    Breast cancer Paternal Grandmother 19   Breast cancer Paternal Aunt        dx in her 40s   Cancer Maternal Grandmother        intra-abdominal cancer   Brain cancer Maternal Uncle 62   Brain cancer Cousin 52       maternal cousin   Brain cancer Cousin 33       paternal cousin   Colon cancer Neg Hx    Past Surgical History:  Procedure Laterality Date   ABDOMINAL HYSTERECTOMY     APPENDECTOMY     BILATERAL TOTAL MASTECTOMY WITH AXILLARY LYMPH NODE DISSECTION  08/30/2013   Dr Barry Dienes   BREAST CYST ASPIRATION     9 cysts   CATARACT  EXTRACTION, BILATERAL  2005/2007   CHOLECYSTECTOMY     COLONOSCOPY     CYSTOCELE REPAIR     EVACUATION BREAST HEMATOMA Left 08/31/2013   Procedure: EVACUATION HEMATOMA BREAST;  Surgeon: Stark Klein, MD;  Location: [facility];  Service: General;  Laterality: Left;   EYE SURGERY     to repair macular hole   FOOT ARTHROPLASTY     lt    GANGLION CYST EXCISION     rt foot   HEMORRHOID SURGERY     03/1993   JOINT REPLACEMENT  03/15/11   left knee replacement   KNEE ARTHROSCOPY     /partial knee 2016/left knee 2012   MASS EXCISION  11/04/2011   Procedure: EXCISION MASS;  Surgeon: Cammie Sickle., MD;  Location: [facility];  Service: Orthopedics;  Laterality: Right;  excisional biopsy right ulna mass   MASTECTOMY W/ SENTINEL NODE BIOPSY Right 08/30/2013   Procedure: RIGHT  AXILLARY SENTINEL LYMPH NODE BIOPSY; Right Axillary Node Disection;  Surgeon: Stark Klein, MD;  Location: [facility];  Service: General;  Laterality: Right;  Right side nuc med 7:00    PARTIAL KNEE ARTHROPLASTY Right 11/03/2015   Procedure: RIGHT KNEE MEDIAL UNICOMPARTMENTAL ARTHROPLASTY ;  Surgeon: Gaynelle Arabian, MD;  Location: [facility];  Service: Orthopedics;  Laterality: Right;   RECTOCELE REPAIR     SIMPLE MASTECTOMY WITH AXILLARY SENTINEL NODE BIOPSY Left 08/30/2013   Procedure: Bilateral Breast Mastectomy ;  Surgeon: Stark Klein, MD;  Location: [place];  Service: General;  Laterality: Left;   skin tags removed     breast, panty line, neckline   TOE SURGERY     preventative crossover toe surg/right foot   TOE SURGERY  2009   left foot/screw  in 2nd toe   TONSILLECTOMY     UPPER GASTROINTESTINAL ENDOSCOPY     Social History   Social History Narrative   Occupation:  Retired Radiation protection practitioner    Married with 3 grown children      Never Smoked     Alcohol use-no        Immunization History  Administered Date(s) Administered   Fluad Quad(high Dose 65+) 07/13/2019, 10/01/2020   Influenza Split 07/27/2012    Influenza Whole 09/13/2007, 08/08/2008, 07/30/2009, 08/25/2010   Influenza, High Dose Seasonal PF 08/03/2014, 08/08/2015, 08/23/2016, 07/14/2017, 07/20/2018   Influenza,inj,Quad PF,6+ Mos 07/25/2013   PFIZER(Purple Top)SARS-COV-2 Vaccination 12/23/2019, 01/17/2020, 08/18/2020   Pneumococcal Conjugate-13 08/16/2013   Pneumococcal Polysaccharide-23 06/18/2008   Tdap 11/29/2012   Zoster Recombinat (Shingrix) 04/11/2018, 09/22/2018   Zoster, Live 08/05/2011     Objective: Vital Signs: BP 130/70 (BP Location: Left Arm, Patient Position: Sitting, Cuff Size: Small)   Pulse 76   Resp 12   Ht '4\' 11"'$  (1.499 m)   Wt 171 lb 12.8 oz (77.9 kg)   BMI 34.70 kg/m    Physical Exam Vitals and nursing note reviewed.  Constitutional:      Appearance: She is well-developed.  HENT:     Head: Normocephalic and atraumatic.  Eyes:     Conjunctiva/sclera: Conjunctivae normal.  Cardiovascular:     Rate and Rhythm: Normal rate and regular rhythm.     Heart sounds: Normal heart sounds.  Pulmonary:     Effort: Pulmonary effort is normal.     Breath sounds: Normal breath sounds.  Abdominal:     General: Bowel sounds are normal.     Palpations: Abdomen is soft.  Musculoskeletal:     Cervical back: Normal range of motion.  Lymphadenopathy:     Cervical: No cervical adenopathy.  Skin:    General: Skin is warm and dry.     Capillary Refill: Capillary refill takes less than 2 seconds.  Neurological:     Mental Status: She is alert and oriented to person, place, and time.  Psychiatric:        Behavior: Behavior normal.     Musculoskeletal Exam: C-spine was in good range of motion.  She had thoracic kyphosis.  She had limited painful range of motion of the lumbar spine.  Shoulder joints are in good range of motion with some discomfort.  Elbow joints and wrist joints with good range of motion.  She had tenderness over bilateral CMC  joint more prominent in the left hand.  PIP and DIP thickening was noted.   Hip joints with good range of motion.  Bilateral knee joints were replaced without any warmth swelling or effusion.  There was no tenderness over ankles or MTPs.  CDAI Exam: CDAI Score: -- Patient Global: --; Provider Global: -- Swollen: --; Tender: -- Joint Exam 06/30/2021   No joint exam has been documented for this visit   There is currently no information documented on the homunculus. Go to the Rheumatology activity and complete the homunculus joint exam.  Investigation: No additional findings.  Imaging: No results found.  Recent Labs: Lab Results  Component Value Date   WBC 7.7 06/25/2021   HGB 15.2 (H) 06/25/2021   PLT 189.0 06/25/2021   NA 140 03/06/2021   K 4.6 03/06/2021   CL 104 03/06/2021   CO2 27 03/06/2021   GLUCOSE 122 (H) 03/06/2021   BUN 17 03/06/2021   CREATININE 0.75 03/06/2021   BILITOT 0.7 03/06/2021   ALKPHOS 76 03/06/2021   AST 17 03/06/2021   ALT 27 03/06/2021   PROT 6.5 03/06/2021   ALBUMIN 4.3 03/06/2021   CALCIUM 9.5 03/06/2021   GFRAA 77 09/12/2020    Speciality Comments: No specialty comments available.  Procedures:  No procedures performed Allergies: Patient has no known allergies.   Assessment / Plan:     Visit Diagnoses: Fibromyalgia-she continues to have generalized pain and discomfort and has positive tender points.  Weight loss diet and exercise including the water aerobics was discussed.  Primary osteoarthritis of both hands-she has been having increased pain and discomfort in the left CMC joint.  She has been seen at [facility] and had injections x2 without much results.  She is planning to have left [facility] surgery.  She has been using a CMC brace.  Joint protection muscle strengthening was discussed.  Status post total left knee replacement-chronic pain  Status post right partial knee replacement-chronic pain  Chronic bilateral low back pain without sciatica - X-rays of the lumbar spine updated on 11/27/20 revealed curvature,  degenerative anterolisthesis at L3-L4 and L4-5.  Lumbar region facet arthropathy, right worse.  She continues to have lower back discomfort.  We had detailed discussion regarding core muscle strengthening exercises.  Need for regular exercise was emphasized.  A handout on back exercises was given.  Other medical problems are listed as follows:  Pulmonary fibrosis (HCC)  Allergic asthma, mild intermittent, uncomplicated  History of colonic polyps  History of hypothyroidism  Prediabetes  Malignant neoplasm of lower-outer quadrant of right breast of female, estrogen receptor positive (HCC)  History of gastroesophageal reflux (GERD)  History of nephrolithiasis  History of IBS  MDD (major depressive disorder), recurrent episode, mild ([place])  History of hyperlipidemia  Orders: No orders of the defined types were placed in this encounter.  No orders of the defined types were placed in this encounter.    Follow-Up Instructions: Return in about 6 months (around 12/31/2021) for Osteoarthritis, FMS.   Bo Merino, MD  Note - This record has been created using Editor, commissioning.  Chart creation errors have been sought, but may not always  have been located. Such creation errors do not reflect on  the standard of medical care.

## 2021-06-16 NOTE — Chronic Care Management (AMB) (Signed)
    Chronic Care Management Pharmacy Assistant   Name: Jean Nash  MRN: [id number] DOB: November 10, 1941  06-16-2021- Patient called to remind of appointment with Jeni Salles Clinical Pharmacist at 2:30 in office.  Patient aware of appointment date, time, and type of appointment Patient aware to have/bring all medications, supplements, blood pressure and/or blood sugar logs to visit.   Care Gaps:  COVID Booster #4 - Overdue Flu Vaccine - Overdue AWV no past or future visit  - MSG sent to Ramond Craver CMA to schedule. Star Rating Drug:  Simvastatin '20mg'$  - Last filled 03-10-2021 365DS  at [facility]  Any gaps in medications fill history? None   Medications: Outpatient Encounter Medications as of 06/16/2021  Medication Sig Note   butalbital-acetaminophen-caffeine (FIORICET) 50-325-40 MG tablet Take 1 tablet by mouth as needed for headache.    Calcium Carbonate-Vitamin D (CALCIUM-VITAMIN D3 PO) Take 1 tablet by mouth in the morning and at bedtime. 05/16/2020: 200 units/ 23mg tablet   Cholecalciferol (VITAMIN D3) 50 MCG (2000 UT) TABS Take 1 tablet by mouth daily.     cyclobenzaprine (FLEXERIL) 5 MG tablet Take 1 tablet (5 mg total) by mouth at bedtime as needed for muscle spasms.    diazepam (VALIUM) 5 MG tablet Take 5 mg by mouth every 6 (six) hours as needed for anxiety. As needed (flying & dental)    esomeprazole (NEXIUM) 40 MG capsule Take 1 capsule (40 mg total) by mouth daily before breakfast. Take 1 capsule by mouth  daily    famotidine (PEPCID) 40 MG tablet TAKE ONE TABLET BY MOUTH EVERY NIGHT AT BEDTIME    levothyroxine (SYNTHROID) 100 MCG tablet TAKE ONE TABLET BY MOUTH DAILY    Lutein-Zeaxanthin 25-5 MG CAPS Take 1 capsule by mouth daily.    MELATONIN PO Take 1 tablet by mouth daily.    omega-3 acid ethyl esters (LOVAZA) 1 g capsule Take 1 capsule (1 g total) by mouth 2 (two) times daily.    oxyCODONE-acetaminophen (PERCOCET) 5-325 MG tablet Take 0.5-1 tablets by mouth every  6 (six) hours as needed for severe pain (may cause constipation; do not take concurrently with tramadol or Fioricet).    simvastatin (ZOCOR) 20 MG tablet Take 1 tablet (20 mg total) by mouth at bedtime.    sucralfate (CARAFATE) 1 g tablet Take 1 g by mouth in the morning, at noon, in the evening, and at bedtime.    topiramate (TOPAMAX) 25 MG tablet TAKE ONE TABLET BY MOUTH DAILY    traMADol (ULTRAM) 50 MG tablet Take 50 mg by mouth every 6 (six) hours as needed.    venlafaxine XR (EFFEXOR-XR) 37.5 MG 24 hr capsule TAKE ONE CAPSULE BY MOUTH DAILY WITH BREAKFAST    No facility-administered encounter medications on file as of 06/16/2021.    LClarksdaleClinical Pharmacist Assistant [phone]

## 2021-06-17 ENCOUNTER — Ambulatory Visit (INDEPENDENT_AMBULATORY_CARE_PROVIDER_SITE_OTHER): Payer: Medicare HMO | Admitting: Pharmacist

## 2021-06-17 ENCOUNTER — Other Ambulatory Visit: Payer: Self-pay

## 2021-06-17 DIAGNOSIS — E785 Hyperlipidemia, unspecified: Secondary | ICD-10-CM

## 2021-06-17 DIAGNOSIS — M8580 Other specified disorders of bone density and structure, unspecified site: Secondary | ICD-10-CM

## 2021-06-17 NOTE — Progress Notes (Signed)
Chronic Care Management Pharmacy Note  07/08/2021 Name:  Jean Nash MRN:  [id number] DOB:  Jul 22, 1941  Summary: LDL not at goal < 100  Recommendations/Changes made from today's visit: -Recommend repeat lipid panel and escalating to high intensity statin therapy if LDL > 100 -Recommend repeat DEXA, A1c -Recommended for patient to take calcium citrate 600 mg daily  Plan: Follow up HLD assessment in 2 months   Subjective: Jean Nash is an 80 y.o. year old female who is a primary patient of [name], NP.  The CCM team was consulted for assistance with disease management and care coordination needs.    Engaged with patient face to face for follow up visit in response to provider referral for pharmacy case management and/or care coordination services.   Consent to Services:  The patient was given information about Chronic Care Management services, agreed to services, and gave verbal consent prior to initiation of services.  Please see initial visit note for detailed documentation.   Patient Care Team: [name], NP as PCP - General (Family Medicine) Susa Day, MD as Consulting Physician (Orthopedic Surgery) Earnie Larsson, [facility] as Pharmacist (Pharmacist)  Recent office visits: 05-19-2021 Eulas Post, MD - Patient presented for lump on breast and other issues. No medication changes.  04/10/21 [name], NP: Patient presented for headache and ear ache.  03/06/21 [name], NP: Patient presented for annual exam. LDL increased and simvastatin increased from 10 mg 20 mg daily.  Recent consult visits: 03/25/21 Daleen Bo, PT: Patient presented for PT treatment.  02/25/21 Baird Lyons, MD (pulmonary): Patient presented for OSA follow up.   12/31/20 Leonia Reader (rheumatology): Patient presented for fibromyalgia and osteoarthritis.  Hospital visits: Medication Reconciliation was completed by comparing discharge summary, patient's EMR and  Pharmacy list, and upon discussion with patient.   Patient visited [facility] ED on 04-24-2021  due to right thumb pain. Patient spent 1 hour in the ED.    New?Medications Started at [facility] Discharge:?? -started Prescribed Oxycodone-acetaminophen 5-325mg  PRN   Medication Changes at Hospital Discharge: -Changed None   Medications Discontinued at Hospital Discharge: -Stopped None   Medications that remain the same after Hospital Discharge:?? -All other medications will remain the same.       Objective:  Lab Results  Component Value Date   CREATININE 0.75 03/06/2021   BUN 17 03/06/2021   GFR 76.53 03/06/2021   GFRNONAA 66 09/12/2020   GFRAA 77 09/12/2020   NA 140 03/06/2021   K 4.6 03/06/2021   CALCIUM 9.5 03/06/2021   CO2 27 03/06/2021   GLUCOSE 122 (H) 03/06/2021    Lab Results  Component Value Date/Time   HGBA1C 5.4 10/05/2019 03:49 PM   HGBA1C 6.0 07/14/2017 09:56 AM   HGBA1C 5.8 06/14/2017 12:57 PM   HGBA1C 6.3 08/16/2016 08:50 AM   GFR 76.53 03/06/2021 10:37 AM   GFR 81.23 11/15/2019 09:14 AM   MICROALBUR <0.7 05/17/2016 07:49 AM   MICROALBUR 0.50 07/01/2011 10:25 AM    Last diabetic Eye exam:  Lab Results  Component Value Date/Time   HMDIABEYEEXA No Retinopathy 04/29/2017 12:00 AM    Last diabetic Foot exam: No results found for: HMDIABFOOTEX   Lab Results  Component Value Date   CHOL 209 (H) 03/06/2021   HDL 51.10 03/06/2021   LDLCALC 138 (H) 03/06/2021   LDLDIRECT 113.0 05/17/2016   TRIG 101.0 03/06/2021   CHOLHDL 4 03/06/2021    Hepatic Function Latest Ref Rng & Units 03/06/2021 04/09/2020 11/15/2019  Total Protein 6.0 - 8.3 g/dL 6.5 6.9 6.2  Albumin 3.5 - 5.2 g/dL 4.3 4.2 4.3  AST 0 - 37 U/L $Remo'17 22 16  'kijYY$ ALT 0 - 35 U/L $Remo'27 23 20  'AetOr$ Alk Phosphatase 39 - 117 U/L 76 56 67  Total Bilirubin 0.2 - 1.2 mg/dL 0.7 0.7 0.5  Bilirubin, Direct 0.0 - 0.3 mg/dL - - -    Lab Results  Component Value Date/Time   TSH 2.24 03/06/2021 10:37 AM   TSH  1.45 05/20/2020 11:55 AM   FREET4 1.14 12/13/2013 10:55 AM    CBC Latest Ref Rng & Units 06/25/2021 03/06/2021 09/12/2020  WBC 4.0 - 10.5 K/uL 7.7 6.6 6.6  Hemoglobin 12.0 - 15.0 g/dL 15.2(H) 15.5(H) 15.5  Hematocrit 36.0 - 46.0 % 45.3 46.7(H) 45.9(H)  Platelets 150.0 - 400.0 K/uL 189.0 185.0 183    Lab Results  Component Value Date/Time   VD25OH 56.40 07/14/2017 09:56 AM    Clinical ASCVD: No  The 10-year ASCVD risk score Mikey Bussing DC Jr., et al., 2013) is: 25.2%   Values used to calculate the score:     Age: 77 years     Sex: Female     Is Non-Hispanic African American: No     Diabetic: No     Tobacco smoker: No     Systolic Blood Pressure: 974 mmHg     Is BP treated: No     HDL Cholesterol: 51.1 mg/dL     Total Cholesterol: 209 mg/dL    Depression screen [facility] 2/9 08/08/2020 07/20/2018  Decreased Interest 0 0  Down, Depressed, Hopeless 0 0  PHQ - 2 Score 0 0  Altered sleeping 0 -  Tired, decreased energy 0 -  Change in appetite 0 -  Feeling bad or failure about yourself  0 -  Trouble concentrating 0 -  Moving slowly or fidgety/restless 0 -  Suicidal thoughts 0 -  PHQ-9 Score 0 -  Difficult doing work/chores Not difficult at all -  Some recent data might be hidden      Social History   Tobacco Use  Smoking Status Former   Packs/day: 0.10   Years: 2.00   Pack years: 0.20   Types: Cigarettes   Start date: 11/16/1959   Quit date: 11/15/1960   Years since quitting: 60.6  Smokeless Tobacco Never   BP Readings from Last 3 Encounters:  07/06/21 140/70  06/30/21 130/70  06/26/21 110/64   Pulse Readings from Last 3 Encounters:  07/06/21 85  06/30/21 76  06/26/21 75   Wt Readings from Last 3 Encounters:  07/06/21 175 lb 12.8 oz (79.7 kg)  06/30/21 171 lb 12.8 oz (77.9 kg)  06/26/21 173 lb (78.5 kg)   BMI Readings from Last 3 Encounters:  07/06/21 35.51 kg/m  06/30/21 34.70 kg/m  06/26/21 34.94 kg/m    Assessment/Interventions: Review of patient past medical  history, allergies, medications, health status, including review of consultants reports, laboratory and other test data, was performed as part of comprehensive evaluation and provision of chronic care management services.   SDOH:  (Social Determinants of Health) assessments and interventions performed: No  SDOH Screenings   Alcohol Screen: Low Risk    Last Alcohol Screening Score (AUDIT): 1  Depression (PHQ2-9): Low Risk    PHQ-2 Score: 0  Financial Resource Strain: Not on file  Food Insecurity: Not on file  Housing: Low Risk    Last Housing Risk Score: 0  Physical Activity: Sufficiently Active   Days of  Exercise per Week: 7 days   Minutes of Exercise per Session: 60 min  Social Connections: Moderately Integrated   Frequency of Communication with Friends and Family: More than three times a week   Frequency of Social Gatherings with Friends and Family: Twice a week   Attends Religious Services: More than 4 times per year   Active Member of [employer] or Organizations: No   Attends Music therapist: Never   Marital Status: Married  Stress: No Stress Concern Present   Feeling of Stress : Not at all  Tobacco Use: Medium Risk   Smoking Tobacco Use: Former   Smokeless Tobacco Use: Never  Transportation Needs: No Data processing manager (Medical): No   Lack of Transportation (Non-Medical): No    CCM Care Plan  No Known Allergies  Medications Reviewed Today     Reviewed by Rebecca Eaton, [place] (Certified Medical Assistant) on 07/06/21 at 1134  Med List Status: <None>   Medication Order Taking? Sig Documenting Provider Last Dose Status Informant  butalbital-acetaminophen-caffeine (FIORICET) 50-325-40 MG tablet [id number] Yes Take 1 tablet by mouth as needed for headache. Nafziger, Tommi Rumps, NP Taking Active   Calcium Carbonate-Vitamin D (CALCIUM-VITAMIN D3 PO) [id number] Yes Take 1 tablet by mouth in the morning and at bedtime. [provider]  Taking Active Self           Med Note Glennis Brink, ANNETTE   Fri May 16, 2020 11:13 AM) 200 units/ 43mcg tablet  Cholecalciferol (VITAMIN D3) 50 MCG (2000 UT) TABS [id number] Yes Take 2 tablets by mouth daily. [provider] Taking Active   diazepam (VALIUM) 5 MG tablet [id number] Yes Take 5 mg by mouth every 6 (six) hours as needed for anxiety. As needed (flying & dental) [provider] Taking Active   esomeprazole (NEXIUM) 40 MG capsule [id number] Yes Take 1 capsule (40 mg total) by mouth daily before breakfast. Take 1 capsule by mouth  daily Nafziger, Tommi Rumps, NP Taking Active   famotidine (PEPCID) 40 MG tablet [id number] Yes TAKE ONE TABLET BY MOUTH EVERY NIGHT AT BEDTIME Esterwood, Amy S, PA-C Taking Active   levothyroxine (SYNTHROID) 100 MCG tablet [id number] Yes TAKE ONE TABLET BY MOUTH DAILY [name], NP Taking Active   MELATONIN PO [id number] Yes Take 1 tablet by mouth daily. [provider] Taking Active Self  Multiple Vitamins-Minerals (ICAPS AREDS 2 PO) [id number] Yes Take 1 capsule by mouth in the morning and at bedtime. [provider] Taking Active   omega-3 acid ethyl esters (LOVAZA) 1 g capsule [id number]  Take 1 capsule (1 g total) by mouth 2 (two) times daily. Nafziger, Tommi Rumps, NP  Expired 06/30/21 2359   oxyCODONE-acetaminophen (PERCOCET) 5-325 MG tablet [id number] Yes Take 0.5-1 tablets by mouth every 6 (six) hours as needed for severe pain (may cause constipation; do not take concurrently with tramadol or Fioricet). Molpus, John, MD Taking Active   Polyethylene Glycol 3350 (MIRALAX PO) [id number] Yes Take by mouth. 1/2 capful every morning [provider] Taking Active   simvastatin (ZOCOR) 20 MG tablet [id number] Yes Take 1 tablet (20 mg total) by mouth at bedtime. Nafziger, Tommi Rumps, NP Taking Active   topiramate (TOPAMAX) 25 MG tablet [id number] Yes TAKE ONE TABLET BY MOUTH DAILY Nafziger, Tommi Rumps, NP Taking Active   traMADol (ULTRAM) 50 MG tablet  [id number] Yes Take 50 mg by mouth every 6 (six) hours as needed. [provider] Taking Active   venlafaxine XR (EFFEXOR-XR) 37.5 MG 24 hr capsule [id number]  Yes TAKE ONE CAPSULE BY MOUTH DAILY WITH Francesco Sor, NP Taking Active             Patient Active Problem List   Diagnosis Date Noted   Genetic testing 08/05/2020   Fibromyalgia    MDD (major depressive disorder), recurrent, in full remission ([place]) 12/10/2019   Pulmonary fibrosis ([place]) 12/04/2018   MDD (major depressive disorder), recurrent episode, mild ([place]) 12/22/2017   Osteoporosis 07/14/2017   OA (osteoarthritis) of knee 11/03/2015   Osteopenia 08/08/2015   Headache 02/03/2015   Atypical chest pain 07/15/2014   Arthralgia 02/22/2014   Psoriasis 02/22/2014   Malignant neoplasm of lower-outer quadrant of right breast of female, estrogen receptor positive ([place]) 08/09/2013   Neck pain 06/22/2013   Left knee pain 11/29/2012   Reactive depression (situational) 04/26/2012   Right wrist pain 02/02/2012   Right foot pain 10/04/2011   Nevus 06/05/2011   Status post total knee replacement 04/06/2011   Overactive bladder 03/14/2011   KNEE PAIN, LEFT 08/31/2010   HEARING LOSS 08/17/2010   HIRSUTISM 06/16/2009   CYST, IDIOPATHIC 05/20/2008   IRRITABLE BOWEL SYNDROME 04/15/2008   Allergic asthma, mild intermittent, uncomplicated 26/71/2458   GERD 03/19/2008   COLONIC POLYPS, HX OF 03/19/2008   NEPHROLITHIASIS, HX OF 03/19/2008   Hypothyroidism 03/18/2008   Hyperlipidemia 03/18/2008   INSOMNIA, CHRONIC 03/18/2008   FIBROMYALGIA 03/18/2008   Prediabetes 03/18/2008    Immunization History  Administered Date(s) Administered   Fluad Quad(high Dose 65+) 07/13/2019, 10/01/2020   Influenza Split 07/27/2012   Influenza Whole 09/13/2007, 08/08/2008, 07/30/2009, 08/25/2010   Influenza, High Dose Seasonal PF 08/03/2014, 08/08/2015, 08/23/2016, 07/14/2017, 07/20/2018   Influenza,inj,Quad PF,6+ Mos 07/25/2013    PFIZER(Purple Top)SARS-COV-2 Vaccination 12/23/2019, 01/17/2020, 08/18/2020   Pneumococcal Conjugate-13 08/16/2013   Pneumococcal Polysaccharide-23 06/18/2008   Tdap 11/29/2012   Zoster Recombinat (Shingrix) 04/11/2018, 09/22/2018   Zoster, Live 08/05/2011    Conditions to be addressed/monitored:  Hyperlipidemia, GERD, Hypothyroidism, Depression, Osteopenia, and Prediabetes, Insomnia, Migraines, and Fibromyalgia  Conditions addressed this visit: Hyperlipidemia, Pre-diabetes, Osteopenia  Care Plan : CCM Pharmacy Care Plan  Updates made by Viona Gilmore, [place] since 07/08/2021 12:00 AM     Problem: Problem: Hyperlipidemia, GERD, Hypothyroidism, Depression, Osteopenia, and Prediabetes, Insomnia, Migraines, and Fibromyalgia      Long-Range Goal: Patient-Specific Goal   Start Date: 06/17/2021  Expected End Date: 06/17/2022  This Visit's Progress: On track  Priority: High  Note:   Current Barriers:  Unable to independently monitor therapeutic efficacy Unable to achieve control of cholesterol   Pharmacist Clinical Goal(s):  Patient will achieve adherence to monitoring guidelines and medication adherence to achieve therapeutic efficacy achieve control of cholesterol as evidenced by lipid panel  through collaboration with PharmD and provider.   Interventions: 1:1 collaboration with [name], NP regarding development and update of comprehensive plan of care as evidenced by provider attestation and co-signature Inter-disciplinary care team collaboration (see longitudinal plan of care) Comprehensive medication review performed; medication list updated in electronic medical record  Hyperlipidemia: (LDL goal < 100) -Uncontrolled -Current treatment: Simvastatin 20 mg 1 tablet daily Omega 3 1 g 1 capsule twice daily -Medications previously tried: none  -Current dietary patterns: very infrequent fried foods; meat eater - cut back on red meat twice a week; chicken and fish -Current  exercise habits: swims in her pool often -Educated on Cholesterol goals;  Benefits of statin for ASCVD risk reduction; Importance of limiting foods high in cholesterol; Exercise goal of 150 minutes per week; -  Recommended increasing to high intensity statin  Pre-diabetes (A1c goal <6.5%) -Controlled -Current medications: No medications -Medications previously tried: none  -Current home glucose readings fasting glucose: does not need to check post prandial glucose: does not need to check -Denies hypoglycemic/hyperglycemic symptoms -Current meal patterns:  breakfast: 3 eggs lunch: n/a  dinner: n/a snacks: n/a drinks: n/a -Current exercise: swims in pool -Educated on A1c and blood sugar goals; Exercise goal of 150 minutes per week; Carbohydrate counting and/or plate method -Counseled to check feet daily and get yearly eye exams -Counseled on diet and exercise extensively  Depression/Anxiety (Goal: minimize symptoms) -Controlled -Current treatment: venlafaxine XR 37.5mg , 1 capsule daily with breakfast  Diazepam 5 mg as needed -Medications previously tried/failed: citalopram -PHQ9: 0 -GAD7: n/a -Educated on Benefits of medication for symptom control -Recommended to continue current medication  Osteopenia (Goal prevent fractures) -Not ideally controlled Last DEXA Scan: 08/12/2017             T-Score femoral neck: RFN: -1.7; LFN: -2.0             T-Score lumbar spine: -1.8  T-Score total hip: n/a  T-Score forearm radius: n/a  10-year probability of major osteoporotic fracture: n/a  10-year probability of hip fracture: n/a -Patient is not a candidate for pharmacologic treatment -Current treatment  Vitamin D 2000 units daily Calcium daily -Medications previously tried: none  -Recommend [phone] units of vitamin D daily. Recommend 1200 mg of calcium daily from dietary and supplemental sources. Recommend weight-bearing and muscle strengthening exercises for building and  maintaining bone density. -Counseled on taking calcium citrate 600 mg daily. Recommended repeat DEXA.  Hypothyroidism (Goal: TSH 2-4 (with osteopenia)) -Controlled -Current treatment  Levothyroxine 129mcg, 1 tablet once daily  -Medications previously tried: none  -Recommended to continue current medication  GERD (Goal: minimize symptoms) -Controlled -Current treatment  Famotidine 40 mg 1 tablet at bedtime Esomeprazole 40 mg  1 capsule before breakfast -Medications previously tried: none  -Recommended to continue current medication  Insomnia (Goal: improve quality and quantity of sleep) -Controlled -Current treatment  Melatonin 5 mg every night -Medications previously tried: none  -Recommended to continue current medication  Migraines (Goal: minimize pain) -Controlled -Current treatment  topiramate 25mg , 1 tablet once daily  Butalbital- APAP- caffeine 50-325-40mg , 1 tablet as needed for headache -Medications previously tried: none  -Recommended to continue current medication Patient rarely takes butalbital and she is not sure if topiramate is helpful.   Health Maintenance -Vaccine gaps: COVID booster, influenza -Current therapy:  Lutein 25-5 mg 1 capsule daily Fexofenadine as needed -Educated on Cost vs benefit of each product must be carefully weighed by individual consumer -Patient is satisfied with current therapy and denies issues -Recommended to continue current medication  Patient Goals/Self-Care Activities Patient will:  - take medications as prescribed target a minimum of 150 minutes of moderate intensity exercise weekly engage in dietary modifications by increasing fiber intake and limiting fried foods  Follow Up Plan: Face to Face appointment with care management team member scheduled for:         Calcium, statin, dexa, A1c. Hiatal hernia   Medication Assistance: None required.  Patient affirms current coverage meets  needs.  Compliance/Adherence/Medication fill history: Care Gaps: COVID booster, influenza  Star-Rating Drugs: Simvastatin 20mg  - Last filled 03-10-2021 365DS  at [facility]  Patient's preferred pharmacy is:  [facility] [id number] - [place], [address] [place] [address] Phone: [phone] Fax: [phone]  Uses  pill box? Yes Pt endorses 99% compliance  We discussed: Current pharmacy is preferred with insurance plan and patient is satisfied with pharmacy services Patient decided to: Continue current medication management strategy  Care Plan and Follow Up Patient Decision:  Patient agrees to Care Plan and Follow-up.  Plan: Face to Face appointment with care management team member scheduled for: 6 months  Jeni Salles, PharmD, [place] Pharmacist [place] [facility] [place] [phone]

## 2021-06-23 ENCOUNTER — Telehealth: Payer: Self-pay | Admitting: Physician Assistant

## 2021-06-23 ENCOUNTER — Other Ambulatory Visit: Payer: Self-pay

## 2021-06-23 MED ORDER — SUCRALFATE 1 G PO TABS: 1.0000 g | ORAL_TABLET | Freq: Four times a day (QID) | ORAL | 0 refills | Status: DC

## 2021-06-23 NOTE — Telephone Encounter (Signed)
Spoke with the spouse. The patient is talking in the background. Patient says her ulcer is bleeding again. She is having black stools. She is requesting a refill on Carafate. She has been off of this for a while.  I have refilled the Carafate and can get her scheduled to be seen 07/01/21.  Do you want any labs before then?

## 2021-06-23 NOTE — Telephone Encounter (Signed)
Returned the patients husband call and attempted to make an appointment. He stated the patient is bleeding and Amy Estewood did not have an appointment soon enough and wants to know what to do in the meantime.

## 2021-06-23 NOTE — Telephone Encounter (Signed)
Patients husband called requesting refill on Carafate for the patient said he spoke with PCP and they advised him to call GI

## 2021-06-24 ENCOUNTER — Other Ambulatory Visit: Payer: Self-pay

## 2021-06-24 DIAGNOSIS — K2970 Gastritis, unspecified, without bleeding: Secondary | ICD-10-CM

## 2021-06-24 NOTE — Telephone Encounter (Signed)
Lab arranged with the spouse who advises me this is the 3rd phone call today!

## 2021-06-24 NOTE — Telephone Encounter (Signed)
I was able to get her in on 06/26/21. Spoke with the patient and spouse. She agrees to be seen 06/26/21 at 1:30 pm with Tye Savoy, NP. She has picked up her Carafate and tells me today she is feeling a little better.

## 2021-06-25 ENCOUNTER — Other Ambulatory Visit (INDEPENDENT_AMBULATORY_CARE_PROVIDER_SITE_OTHER): Payer: Medicare HMO

## 2021-06-25 DIAGNOSIS — K2970 Gastritis, unspecified, without bleeding: Secondary | ICD-10-CM

## 2021-06-25 LAB — CBC WITH DIFFERENTIAL/PLATELET
Basophils Absolute: 0 10*3/uL (ref 0.0–0.1)
Basophils Relative: 0.5 % (ref 0.0–3.0)
Eosinophils Absolute: 0.2 10*3/uL (ref 0.0–0.7)
Eosinophils Relative: 2.1 % (ref 0.0–5.0)
HCT: 45.3 % (ref 36.0–46.0)
Hemoglobin: 15.2 g/dL — ABNORMAL HIGH (ref 12.0–15.0)
Lymphocytes Relative: 28.5 % (ref 12.0–46.0)
Lymphs Abs: 2.2 10*3/uL (ref 0.7–4.0)
MCHC: 33.6 g/dL (ref 30.0–36.0)
MCV: 88.9 fl (ref 78.0–100.0)
Monocytes Absolute: 0.6 10*3/uL (ref 0.1–1.0)
Monocytes Relative: 7.6 % (ref 3.0–12.0)
Neutro Abs: 4.7 10*3/uL (ref 1.4–7.7)
Neutrophils Relative %: 61.3 % (ref 43.0–77.0)
Platelets: 189 10*3/uL (ref 150.0–400.0)
RBC: 5.1 Mil/uL (ref 3.87–5.11)
RDW: 14.9 % (ref 11.5–15.5)
WBC: 7.7 10*3/uL (ref 4.0–10.5)

## 2021-06-26 ENCOUNTER — Ambulatory Visit (INDEPENDENT_AMBULATORY_CARE_PROVIDER_SITE_OTHER): Payer: Medicare HMO | Admitting: Nurse Practitioner

## 2021-06-26 ENCOUNTER — Encounter: Payer: Self-pay | Admitting: Nurse Practitioner

## 2021-06-26 VITALS — BP 110/64 | HR 75 | Ht 59.0 in | Wt 173.0 lb

## 2021-06-26 DIAGNOSIS — K921 Melena: Secondary | ICD-10-CM | POA: Diagnosis not present

## 2021-06-26 NOTE — Patient Instructions (Signed)
If you are age 78 or older, your body mass index should be between 23-30. Your Body mass index is 34.94 kg/m. If this is out of the aforementioned range listed, please consider follow up with your Primary Care Provider. __________________________________________________________  The [facility] providers would like to encourage you to use [facility] to communicate with providers for non-urgent requests or questions.  Due to long hold times on the telephone, sending your provider a message by [facility] may be a faster and more efficient way to get a response.  Please allow 48 business hours for a response.  Please remember that this is for non-urgent requests.   Follow up as needed.  Thank you for entrusting me with your care and choosing [facility].  Tye Savoy, NP-C

## 2021-06-26 NOTE — Progress Notes (Signed)
ASSESSMENT AND PLAN    # 80 year old female with black substance in stool over the last few weeks. She was under the impression that she was supposed to be taking Carafate on a regular basis and that being off of Carafate has led to a bleeding ulcer. No explanation for the black "spots" in her stool but there is not any evidence for GI bleeding at present.  -- I explained to the patient and her husband that it is okay that she hasn't been taking Carafate and there is no indication for her to take it now.  We actually discontinued the medication at the time of her EGD in 2021 which showed a healing 5 mm gastric ulcer ( biopsies negative for H.pylori)  --Her hgb is in the 15 range on labs yesterday and BUN is normal. On DRE today there is a scant amount of light brown stool in vault which is heme negative.  --Continue to avoid NSAIDS.  HISTORY OF PRESENT ILLNESS    Chief Complaint :  black spots in stool  Jean Nash is a 80 y.o. female known to Dr. Fuller Plan  with a past medical history significant for adenomatous colon polyps, diverticulosis, GERD, fibromyalgia, kidney stones, obesity. See PMH below for any additional medical problems.    Patient called the office a couple of days ago with complaints of black stool.  She came for labs yesterday.  Her hemoglobin was 15.2 ( at baseline).  Patient is here with her husband.  She describes large black particles in bowel movement for the last few weeks. She feels like it is related to the ulcer we found last year. She was off Carafate for a while and thinks that has possibly led to this problem. She has not been taking any NSAIDS. She hasn't had any Meloxicam since we found her gastric ulcer last year.  She is taking Nexium every morning and Pepcid in the evening.  No bismuth use. She doesn't take oral iron   PREVIOUS ENDOSCOPIC EVALUATIONS / PERTINENT STUDIES:   June 2021 EGD --Normal esophagus. - Non-bleeding gastric ulcer with no stigmata of  bleeding. - Gastritis. Biopsied. - Multiple gastric polyps. Biopsied. - Small hiatal hernia. - Normal duodenal bulb and second portion of the duodenum.  Diagnosis 1. Surgical [P], gastric antrum and gastric body - GASTRIC ANTRAL MUCOSA WITH NONSPECIFIC REACTIVE GASTROPATHY - GASTRIC OXYNTIC MUCOSA WITH NO SPECIFIC HISTOPATHOLOGIC CHANGES - WARTHIN STARRY STAIN IS NEGATIVE FOR HELICOBACTER PYLORI 2. Surgical [P], gastric polyps - FUNDIC GLAND POLYP(S) - HYPERPLASTIC POLYP - NEGATIVE FOR INTESTINAL METAPLASIA OR DYSPLASIA     Past Medical History:  Diagnosis Date   Adenomatous colon polyp    Anxiety    PHOBIAS   Arthritis    Breast cancer ([place]) 08/08/13   right LOQ   Cataract    Chronic insomnia    Cluster headaches    history of migraines / NONE FOR SEVERAL YRS   Depression    Diverticulosis    Fatty liver 2011   Fibromyalgia    GERD (gastroesophageal reflux disease)    H/O hiatal hernia    History of transfusion 08/30/2013   Hx of radiation therapy 10/29/13- 12/14/13   right chest wall 5040 cGy 28 sessions, right supraclavicular/axillary region 5040 cGy 28 sessions, right chest wall boost 1000 cGy 5 sessions   Hypothyroidism    Internal hemorrhoids    Irritable bowel syndrome    Kidney stone    Lymphedema    RT  ARM - WEARS SLEEVE   Macular degeneration    hole/right eye   MDD (major depressive disorder)    Osteopenia    Other abnormal glucose    Other and unspecified hyperlipidemia    Pain in joint, shoulder region    Pneumonia [id number]   Sleep apnea    USES C-PAP   Stress incontinence, female     Current Medications, Allergies, Past Surgical History, Family History and Social History were reviewed in [employer] record.   Current Outpatient Medications  Medication Sig Dispense Refill   butalbital-acetaminophen-caffeine (FIORICET) 50-325-40 MG tablet Take 1 tablet by mouth as needed for headache. 30 tablet 1   Calcium  Carbonate-Vitamin D (CALCIUM-VITAMIN D3 PO) Take 1 tablet by mouth in the morning and at bedtime.     Cholecalciferol (VITAMIN D3) 50 MCG (2000 UT) TABS Take 2 tablets by mouth daily.     diazepam (VALIUM) 5 MG tablet Take 5 mg by mouth every 6 (six) hours as needed for anxiety. As needed (flying & dental)     esomeprazole (NEXIUM) 40 MG capsule Take 1 capsule (40 mg total) by mouth daily before breakfast. Take 1 capsule by mouth  daily 365 capsule 0   famotidine (PEPCID) 40 MG tablet TAKE ONE TABLET BY MOUTH EVERY NIGHT AT BEDTIME 90 tablet 1   levothyroxine (SYNTHROID) 100 MCG tablet TAKE ONE TABLET BY MOUTH DAILY 365 tablet 0   MELATONIN PO Take 1 tablet by mouth daily.     Multiple Vitamins-Minerals (ICAPS AREDS 2 PO) Take 1 capsule by mouth in the morning and at bedtime.     oxyCODONE-acetaminophen (PERCOCET) 5-325 MG tablet Take 0.5-1 tablets by mouth every 6 (six) hours as needed for severe pain (may cause constipation; do not take concurrently with tramadol or Fioricet). 10 tablet 0   Polyethylene Glycol 3350 (MIRALAX PO) Take by mouth. 1/2 capful every morning     simvastatin (ZOCOR) 20 MG tablet Take 1 tablet (20 mg total) by mouth at bedtime. 360 tablet 0   sucralfate (CARAFATE) 1 g tablet Take 1 tablet (1 g total) by mouth in the morning, at noon, in the evening, and at bedtime. 120 tablet 0   topiramate (TOPAMAX) 25 MG tablet TAKE ONE TABLET BY MOUTH DAILY 325 tablet 0   traMADol (ULTRAM) 50 MG tablet Take 50 mg by mouth every 6 (six) hours as needed.     venlafaxine XR (EFFEXOR-XR) 37.5 MG 24 hr capsule TAKE ONE CAPSULE BY MOUTH DAILY WITH BREAKFAST 90 capsule 1   omega-3 acid ethyl esters (LOVAZA) 1 g capsule Take 1 capsule (1 g total) by mouth 2 (two) times daily. 180 capsule 3   No current facility-administered medications for this visit.    Review of Systems: No chest pain. No shortness of breath. Frequent urination ( chronic)  PHYSICAL EXAM :    Wt Readings from Last 3  Encounters:  05/19/21 174 lb 9.6 oz (79.2 kg)  04/24/21 176 lb 5.9 oz (80 kg)  04/10/21 176 lb (79.8 kg)   Constitutional:  Pleasant female in no acute distress. Psychiatric: Normal mood and affect. Behavior is normal. EENT: Pupils normal.  Conjunctivae are normal. No scleral icterus. Neck supple.  Cardiovascular: Normal rate, regular rhythm. No edema Pulmonary/chest: Effort normal and breath sounds normal. No wheezing, rales or rhonchi. Abdominal: Soft, nondistended, nontender. Bowel sounds active throughout. There are no masses palpable. No hepatomegaly. Rectal : scant light brown stool in vault, heme negative.  Neurological: Alert  and oriented to person place and time. Skin: Skin is warm and dry. No rashes noted.  Tye Savoy, NP  06/26/2021, 1:37 PM

## 2021-06-30 ENCOUNTER — Other Ambulatory Visit: Payer: Self-pay

## 2021-06-30 ENCOUNTER — Ambulatory Visit (INDEPENDENT_AMBULATORY_CARE_PROVIDER_SITE_OTHER): Payer: Medicare HMO | Admitting: Rheumatology

## 2021-06-30 ENCOUNTER — Encounter: Payer: Self-pay | Admitting: Rheumatology

## 2021-06-30 VITALS — BP 130/70 | HR 76 | Resp 12 | Ht 59.0 in | Wt 171.8 lb

## 2021-06-30 DIAGNOSIS — Z86010 Personal history of colonic polyps: Secondary | ICD-10-CM

## 2021-06-30 DIAGNOSIS — M19042 Primary osteoarthritis, left hand: Secondary | ICD-10-CM

## 2021-06-30 DIAGNOSIS — M797 Fibromyalgia: Secondary | ICD-10-CM | POA: Diagnosis not present

## 2021-06-30 DIAGNOSIS — Z87442 Personal history of urinary calculi: Secondary | ICD-10-CM

## 2021-06-30 DIAGNOSIS — G8929 Other chronic pain: Secondary | ICD-10-CM

## 2021-06-30 DIAGNOSIS — Z96652 Presence of left artificial knee joint: Secondary | ICD-10-CM

## 2021-06-30 DIAGNOSIS — Z170 Estrogen receptor positive status [ER+]: Secondary | ICD-10-CM

## 2021-06-30 DIAGNOSIS — M19041 Primary osteoarthritis, right hand: Secondary | ICD-10-CM | POA: Diagnosis not present

## 2021-06-30 DIAGNOSIS — Z96651 Presence of right artificial knee joint: Secondary | ICD-10-CM

## 2021-06-30 DIAGNOSIS — M545 Low back pain, unspecified: Secondary | ICD-10-CM

## 2021-06-30 DIAGNOSIS — Z8719 Personal history of other diseases of the digestive system: Secondary | ICD-10-CM

## 2021-06-30 DIAGNOSIS — C50511 Malignant neoplasm of lower-outer quadrant of right female breast: Secondary | ICD-10-CM

## 2021-06-30 DIAGNOSIS — Z8639 Personal history of other endocrine, nutritional and metabolic disease: Secondary | ICD-10-CM

## 2021-06-30 DIAGNOSIS — F330 Major depressive disorder, recurrent, mild: Secondary | ICD-10-CM

## 2021-06-30 DIAGNOSIS — J8410 Pulmonary fibrosis, unspecified: Secondary | ICD-10-CM

## 2021-06-30 DIAGNOSIS — R7303 Prediabetes: Secondary | ICD-10-CM

## 2021-06-30 DIAGNOSIS — J4520 Mild intermittent asthma, uncomplicated: Secondary | ICD-10-CM

## 2021-06-30 NOTE — Patient Instructions (Signed)
Back Exercises The following exercises strengthen the muscles that help to support the trunk and back. They also help to keep the lower back flexible. Doing these exercises can help to prevent back pain or lessen existing pain. If you have back pain or discomfort, try doing these exercises 2-3 times each day or as told by your health care provider. As your pain improves, do them once each day, but increase the number of times that you repeat the steps for each exercise (do more repetitions). To prevent the recurrence of back pain, continue to do these exercises once each day or as told by your health care provider. Do exercises exactly as told by your health care provider and adjust them as directed. It is normal to feel mild stretching, pulling, tightness, or discomfort as you do these exercises, but you should stop right away if youfeel sudden pain or your pain gets worse. Exercises Single knee to chest Repeat these steps 3-5 times for each leg: Lie on your back on a firm bed or the floor with your legs extended. Bring one knee to your chest. Your other leg should stay extended and in contact with the floor. Hold your knee in place by grabbing your knee or thigh with both hands and hold. Pull on your knee until you feel a gentle stretch in your lower back or buttocks. Hold the stretch for 10-30 seconds. Slowly release and straighten your leg. Pelvic tilt Repeat these steps 5-10 times: Lie on your back on a firm bed or the floor with your legs extended. Bend your knees so they are pointing toward the ceiling and your feet are flat on the floor. Tighten your lower abdominal muscles to press your lower back against the floor. This motion will tilt your pelvis so your tailbone points up toward the ceiling instead of pointing to your feet or the floor. With gentle tension and even breathing, hold this position for 5-10 seconds. Cat-cow Repeat these steps until your lower back becomes more  flexible: Get into a hands-and-knees position on a firm surface. Keep your hands under your shoulders, and keep your knees under your hips. You may place padding under your knees for comfort. Let your head hang down toward your chest. Contract your abdominal muscles and point your tailbone toward the floor so your lower back becomes rounded like the back of a cat. Hold this position for 5 seconds. Slowly lift your head, let your abdominal muscles relax and point your tailbone up toward the ceiling so your back forms a sagging arch like the back of a cow. Hold this position for 5 seconds.  Press-ups Repeat these steps 5-10 times: Lie on your abdomen (face-down) on the floor. Place your palms near your head, about shoulder-width apart. Keeping your back as relaxed as possible and keeping your hips on the floor, slowly straighten your arms to raise the top half of your body and lift your shoulders. Do not use your back muscles to raise your upper torso. You may adjust the placement of your hands to make yourself more comfortable. Hold this position for 5 seconds while you keep your back relaxed. Slowly return to lying flat on the floor.  Bridges Repeat these steps 10 times: Lie on your back on a firm surface. Bend your knees so they are pointing toward the ceiling and your feet are flat on the floor. Your arms should be flat at your sides, next to your body. Tighten your buttocks muscles and lift your   buttocks off the floor until your waist is at almost the same height as your knees. You should feel the muscles working in your buttocks and the back of your thighs. If you do not feel these muscles, slide your feet 1-2 inches farther away from your buttocks. Hold this position for 3-5 seconds. Slowly lower your hips to the starting position, and allow your buttocks muscles to relax completely. If this exercise is too easy, try doing it with your arms crossed over yourchest. Abdominal  crunches Repeat these steps 5-10 times: Lie on your back on a firm bed or the floor with your legs extended. Bend your knees so they are pointing toward the ceiling and your feet are flat on the floor. Cross your arms over your chest. Tip your chin slightly toward your chest without bending your neck. Tighten your abdominal muscles and slowly raise your trunk (torso) high enough to lift your shoulder blades a tiny bit off the floor. Avoid raising your torso higher than that because it can put too much stress on your low back and does not help to strengthen your abdominal muscles. Slowly return to your starting position. Back lifts Repeat these steps 5-10 times: Lie on your abdomen (face-down) with your arms at your sides, and rest your forehead on the floor. Tighten the muscles in your legs and your buttocks. Slowly lift your chest off the floor while you keep your hips pressed to the floor. Keep the back of your head in line with the curve in your back. Your eyes should be looking at the floor. Hold this position for 3-5 seconds. Slowly return to your starting position. Contact a health care provider if: Your back pain or discomfort gets much worse when you do an exercise. Your worsening back pain or discomfort does not lessen within 2 hours after you exercise. If you have any of these problems, stop doing these exercises right away. Do not do them again unless your health care provider says that you can. Get help right away if: You develop sudden, severe back pain. If this happens, stop doing the exercises right away. Do not do them again unless your health care provider says that you can. This information is not intended to replace advice given to you by your health care provider. Make sure you discuss any questions you have with your healthcare provider. Document Revised: 03/08/2019 Document Reviewed: 08/03/2018 Elsevier Patient Education  [employer].

## 2021-07-01 ENCOUNTER — Telehealth: Payer: Self-pay | Admitting: Adult Health

## 2021-07-01 ENCOUNTER — Ambulatory Visit: Payer: Medicare HMO | Admitting: Physician Assistant

## 2021-07-01 NOTE — Progress Notes (Signed)
Reviewed and agree with management plan.  Encarnacion Scioneaux T. Shylo Zamor, MD FACG 

## 2021-07-01 NOTE — Telephone Encounter (Signed)
Left message for patient to call back and schedule Medicare Annual Wellness Visit (AWV) either virtually or in office.  Left both  my jabber number [phone] and office number    Last AWV 08/08/20  please schedule at anytime with [facility] Nurse Health Advisor 1 or 2   This should be a 45 minute visit.

## 2021-07-06 ENCOUNTER — Ambulatory Visit (INDEPENDENT_AMBULATORY_CARE_PROVIDER_SITE_OTHER): Payer: Medicare HMO | Admitting: Family Medicine

## 2021-07-06 ENCOUNTER — Encounter: Payer: Self-pay | Admitting: Family Medicine

## 2021-07-06 VITALS — BP 140/70 | HR 85 | Temp 97.8°F | Wt 175.8 lb

## 2021-07-06 DIAGNOSIS — H93292 Other abnormal auditory perceptions, left ear: Secondary | ICD-10-CM

## 2021-07-06 DIAGNOSIS — J301 Allergic rhinitis due to pollen: Secondary | ICD-10-CM

## 2021-07-06 NOTE — Patient Instructions (Addendum)
Get back on the Fexofenadine one daily for nasal congestion symptoms.

## 2021-07-06 NOTE — Progress Notes (Signed)
Established Patient Office Visit  Subjective:  [name], female    DOB: 01/01/1941  Age: 80 y.o. MRN: [id number]  CC:  Chief Complaint  Patient presents with   Ear Pain    Follow up on ear pain. Left ear, some improvement but still a lot of pressure    HPI Jean Nash presents for left ear symptoms.  Started about a week ago.  She states that she had intermittent "buzzing" sound confined to the left ear.  No ear pain.  No drainage.  No hearing changes.  No vertigo.  She states her symptoms are actually improved today.  She does sometimes use Q-tips but denies any recent ear trauma.  She states she had audiology assessment about 6 months ago and things were relatively normal.  She does have some chronic nasal congestion.  She has taken antihistamines in the past.  No recent fever.  Past Medical History:  Diagnosis Date   Adenomatous colon polyp    Anxiety    PHOBIAS   Arthritis    Breast cancer ([place]) 08/08/13   right LOQ   Cataract    Chronic insomnia    Cluster headaches    history of migraines / NONE FOR SEVERAL YRS   Depression    Diverticulosis    Fatty liver 2011   Fibromyalgia    GERD (gastroesophageal reflux disease)    H/O hiatal hernia    History of transfusion 08/30/2013   Hx of radiation therapy 10/29/13- 12/14/13   right chest wall 5040 cGy 28 sessions, right supraclavicular/axillary region 5040 cGy 28 sessions, right chest wall boost 1000 cGy 5 sessions   Hypothyroidism    Internal hemorrhoids    Irritable bowel syndrome    Kidney stone    Lymphedema    RT ARM - WEARS SLEEVE   Macular degeneration    hole/right eye   MDD (major depressive disorder)    Osteopenia    Other abnormal glucose    Other and unspecified hyperlipidemia    Pain in joint, shoulder region    Pneumonia 5625,6389   Sleep apnea    USES C-PAP   Stress incontinence, female     Past Surgical History:  Procedure Laterality Date   ABDOMINAL HYSTERECTOMY      APPENDECTOMY     BILATERAL TOTAL MASTECTOMY WITH AXILLARY LYMPH NODE DISSECTION  08/30/2013   Dr Barry Dienes   BREAST CYST ASPIRATION     9 cysts   CATARACT EXTRACTION, BILATERAL  2005/2007   CHOLECYSTECTOMY     COLONOSCOPY     CYSTOCELE REPAIR     EVACUATION BREAST HEMATOMA Left 08/31/2013   Procedure: EVACUATION HEMATOMA BREAST;  Surgeon: Stark Klein, MD;  Location: [facility];  Service: General;  Laterality: Left;   EYE SURGERY     to repair macular hole   FOOT ARTHROPLASTY     lt    GANGLION CYST EXCISION     rt foot   HEMORRHOID SURGERY     03/1993   JOINT REPLACEMENT  03/15/11   left knee replacement   KNEE ARTHROSCOPY     /partial knee 2016/left knee 2012   MASS EXCISION  11/04/2011   Procedure: EXCISION MASS;  Surgeon: Cammie Sickle., MD;  Location: [facility];  Service: Orthopedics;  Laterality: Right;  excisional biopsy right ulna mass   MASTECTOMY W/ SENTINEL NODE BIOPSY Right 08/30/2013   Procedure: RIGHT  AXILLARY SENTINEL LYMPH NODE BIOPSY; Right Axillary Node Disection;  Surgeon: Stark Klein, MD;  Location: [facility];  Service: General;  Laterality: Right;  Right side nuc med 7:00    PARTIAL KNEE ARTHROPLASTY Right 11/03/2015   Procedure: RIGHT KNEE MEDIAL UNICOMPARTMENTAL ARTHROPLASTY ;  Surgeon: Gaynelle Arabian, MD;  Location: [facility];  Service: Orthopedics;  Laterality: Right;   RECTOCELE REPAIR     SIMPLE MASTECTOMY WITH AXILLARY SENTINEL NODE BIOPSY Left 08/30/2013   Procedure: Bilateral Breast Mastectomy ;  Surgeon: Stark Klein, MD;  Location: [facility];  Service: General;  Laterality: Left;   skin tags removed     breast, panty line, neckline   TOE SURGERY     preventative crossover toe surg/right foot   TOE SURGERY  2009   left foot/screw  in 2nd toe   TONSILLECTOMY     UPPER GASTROINTESTINAL ENDOSCOPY      Family History  Problem Relation Age of Onset   Stroke Mother        died age 71   Diabetes Mother    Breast cancer Mother 14   Breast  cancer Sister 20   Breast cancer Paternal Aunt 31   Diabetes Maternal Grandfather    Breast cancer Paternal Grandmother 54   Breast cancer Paternal Aunt        dx in her 27s   Cancer Maternal Grandmother        intra-abdominal cancer   Brain cancer Maternal Uncle 73   Brain cancer Cousin 71       maternal cousin   Brain cancer Cousin 22       paternal cousin   Colon cancer Neg Hx     Social History   Socioeconomic History   Marital status: Married    Spouse name: Fritz Pickerel   Number of children: 3   Years of education: Not on file   Highest education level: Not on file  Occupational History   Occupation: retired bookkeeper  Tobacco Use   Smoking status: Former    Packs/day: 0.10    Years: 2.00    Pack years: 0.20    Types: Cigarettes    Start date: 11/16/1959    Quit date: 11/15/1960    Years since quitting: 60.6   Smokeless tobacco: Never  Vaping Use   Vaping Use: Never used  Substance and Sexual Activity   Alcohol use: Yes    Comment: occ   Drug use: No   Sexual activity: Not on file    Comment: menarche age 70, fist live birth 89, P 3, hysterectomy age 66, no HRT, BCP 2 yrs  Other Topics Concern   Not on file  Social History Narrative   Occupation:  Retired Radiation protection practitioner    Married with 3 grown children      Never Smoked     Alcohol use-no        Social Determinants of Radio broadcast assistant Strain: Not on file  Food Insecurity: Not on file  Transportation Needs: No Transportation Needs   Lack of Transportation (Medical): No   Lack of Transportation (Non-Medical): No  Physical Activity: Sufficiently Active   Days of Exercise per Week: 7 days   Minutes of Exercise per Session: 60 min  Stress: No Stress Concern Present   Feeling of Stress : Not at all  Social Connections: Moderately Integrated   Frequency of Communication with Friends and Family: More than three times a week   Frequency of Social Gatherings with Friends and Family: Twice a week   Attends  Religious  Services: More than 4 times per year   Active Member of Clubs or Organizations: No   Attends Archivist Meetings: Never   Marital Status: Married  Human resources officer Violence: Not At Risk   Fear of Current or Ex-Partner: No   Emotionally Abused: No   Physically Abused: No   Sexually Abused: No    Outpatient Medications Prior to Visit  Medication Sig Dispense Refill   butalbital-acetaminophen-caffeine (FIORICET) 50-325-40 MG tablet Take 1 tablet by mouth as needed for headache. 30 tablet 1   Calcium Carbonate-Vitamin D (CALCIUM-VITAMIN D3 PO) Take 1 tablet by mouth in the morning and at bedtime.     Cholecalciferol (VITAMIN D3) 50 MCG (2000 UT) TABS Take 2 tablets by mouth daily.     diazepam (VALIUM) 5 MG tablet Take 5 mg by mouth every 6 (six) hours as needed for anxiety. As needed (flying & dental)     esomeprazole (NEXIUM) 40 MG capsule Take 1 capsule (40 mg total) by mouth daily before breakfast. Take 1 capsule by mouth  daily 365 capsule 0   famotidine (PEPCID) 40 MG tablet TAKE ONE TABLET BY MOUTH EVERY NIGHT AT BEDTIME 90 tablet 1   levothyroxine (SYNTHROID) 100 MCG tablet TAKE ONE TABLET BY MOUTH DAILY 365 tablet 0   MELATONIN PO Take 1 tablet by mouth daily.     Multiple Vitamins-Minerals (ICAPS AREDS 2 PO) Take 1 capsule by mouth in the morning and at bedtime.     oxyCODONE-acetaminophen (PERCOCET) 5-325 MG tablet Take 0.5-1 tablets by mouth every 6 (six) hours as needed for severe pain (may cause constipation; do not take concurrently with tramadol or Fioricet). 10 tablet 0   Polyethylene Glycol 3350 (MIRALAX PO) Take by mouth. 1/2 capful every morning     simvastatin (ZOCOR) 20 MG tablet Take 1 tablet (20 mg total) by mouth at bedtime. 360 tablet 0   topiramate (TOPAMAX) 25 MG tablet TAKE ONE TABLET BY MOUTH DAILY 325 tablet 0   traMADol (ULTRAM) 50 MG tablet Take 50 mg by mouth every 6 (six) hours as needed.     venlafaxine XR (EFFEXOR-XR) 37.5 MG 24 hr  capsule TAKE ONE CAPSULE BY MOUTH DAILY WITH BREAKFAST 90 capsule 1   omega-3 acid ethyl esters (LOVAZA) 1 g capsule Take 1 capsule (1 g total) by mouth 2 (two) times daily. 180 capsule 3   sucralfate (CARAFATE) 1 g tablet Take 1 tablet (1 g total) by mouth in the morning, at noon, in the evening, and at bedtime. (Patient not taking: Reported on 06/30/2021) 120 tablet 0   No facility-administered medications prior to visit.    No Known Allergies  ROS Review of Systems  Constitutional:  Negative for chills and fever.  HENT:  Positive for postnasal drip and rhinorrhea. Negative for ear pain and hearing loss.   Neurological:  Negative for dizziness.     Objective:    Physical Exam Vitals reviewed.  Constitutional:      General: She is not in acute distress.    Appearance: Normal appearance.  HENT:     Ears:     Comments: Eardrums appear normal.  Her ear canals are normal with the exception of very minimal nonobstructing cerumen in both canals.  No evidence for ear trauma. Neurological:     Mental Status: She is alert.    BP 140/70 (BP Location: Left Arm, Patient Position: Sitting, Cuff Size: Normal)   Pulse 85   Temp 97.8 F (36.6 C) (Oral)  Wt 175 lb 12.8 oz (79.7 kg)   SpO2 97%   BMI 35.51 kg/m  Wt Readings from Last 3 Encounters:  07/06/21 175 lb 12.8 oz (79.7 kg)  06/30/21 171 lb 12.8 oz (77.9 kg)  06/26/21 173 lb (78.5 kg)     Health Maintenance Due  Topic Date Due   COVID-19 Vaccine (4 - Booster for Pfizer series) 11/18/2020   INFLUENZA VACCINE  06/15/2021    There are no preventive care reminders to display for this patient.  Lab Results  Component Value Date   TSH 2.24 03/06/2021   Lab Results  Component Value Date   WBC 7.7 06/25/2021   HGB 15.2 (H) 06/25/2021   HCT 45.3 06/25/2021   MCV 88.9 06/25/2021   PLT 189.0 06/25/2021   Lab Results  Component Value Date   NA 140 03/06/2021   K 4.6 03/06/2021   CHLORIDE 107 09/01/2017   CO2 27  03/06/2021   GLUCOSE 122 (H) 03/06/2021   BUN 17 03/06/2021   CREATININE 0.75 03/06/2021   BILITOT 0.7 03/06/2021   ALKPHOS 76 03/06/2021   AST 17 03/06/2021   ALT 27 03/06/2021   PROT 6.5 03/06/2021   ALBUMIN 4.3 03/06/2021   CALCIUM 9.5 03/06/2021   ANIONGAP 12 04/09/2020   EGFR >60 09/01/2017   GFR 76.53 03/06/2021   Lab Results  Component Value Date   CHOL 209 (H) 03/06/2021   Lab Results  Component Value Date   HDL 51.10 03/06/2021   Lab Results  Component Value Date   LDLCALC 138 (H) 03/06/2021   Lab Results  Component Value Date   TRIG 101.0 03/06/2021   Lab Results  Component Value Date   CHOLHDL 4 03/06/2021   Lab Results  Component Value Date   HGBA1C 5.4 10/05/2019      Assessment & Plan:   Abnormal left ear symptoms recently with normal exam today.  Her symptoms are currently resolved.  -Reassurance and observation for now -Get back on her fexofenadine for allergic rhinitis symptoms    Follow-up: No follow-ups on file.    Carolann Littler, MD

## 2021-07-08 NOTE — Patient Instructions (Signed)
Hi Sniyah,  It was great to get to meet you in person! Below is a summary of some of the topics we discussed.   Please reach out to me if you have any questions or need anything before our follow up!  Best, Maddie  Jeni Salles, PharmD, [place] [facility] [place]   Visit Information   Goals Addressed   None    Patient Care Plan: [facility]CM Pharmacy Care Plan     Problem Identified: Problem: Hyperlipidemia, GERD, Hypothyroidism, Depression, Osteopenia, and Prediabetes, Insomnia, Migraines, and Fibromyalgia      Long-Range Goal: Patient-Specific Goal   Start Date: 06/17/2021  Expected End Date: 06/17/2022  This Visit's Progress: On track  Priority: High  Note:   Current Barriers:  Unable to independently monitor therapeutic efficacy Unable to achieve control of cholesterol   Pharmacist Clinical Goal(s):  Patient will achieve adherence to monitoring guidelines and medication adherence to achieve therapeutic efficacy achieve control of cholesterol as evidenced by lipid panel  through collaboration with PharmD and provider.   Interventions: 1:1 collaboration with [name], NP regarding development and update of comprehensive plan of care as evidenced by provider attestation and co-signature Inter-disciplinary care team collaboration (see longitudinal plan of care) Comprehensive medication review performed; medication list updated in electronic medical record  Hyperlipidemia: (LDL goal < 100) -Uncontrolled -Current treatment: Simvastatin 20 mg 1 tablet daily Omega 3 1 g 1 capsule twice daily -Medications previously tried: none  -Current dietary patterns: very infrequent fried foods; meat eater - cut back on red meat twice a week; chicken and fish -Current exercise habits: swims in her pool often -Educated on Cholesterol goals;  Benefits of statin for ASCVD risk reduction; Importance of limiting foods high in  cholesterol; Exercise goal of 150 minutes per week; -Recommended increasing to high intensity statin  Pre-diabetes (A1c goal <6.5%) -Controlled -Current medications: No medications -Medications previously tried: none  -Current home glucose readings fasting glucose: does not need to check post prandial glucose: does not need to check -Denies hypoglycemic/hyperglycemic symptoms -Current meal patterns:  breakfast: 3 eggs lunch: n/a  dinner: n/a snacks: n/a drinks: n/a -Current exercise: swims in pool -Educated on A1c and blood sugar goals; Exercise goal of 150 minutes per week; Carbohydrate counting and/or plate method -Counseled to check feet daily and get yearly eye exams -Counseled on diet and exercise extensively  Depression/Anxiety (Goal: minimize symptoms) -Controlled -Current treatment: venlafaxine XR 37.'5mg'$ , 1 capsule daily with breakfast  Diazepam 5 mg as needed -Medications previously tried/failed: citalopram -PHQ9: 0 -GAD7: n/a -Educated on Benefits of medication for symptom control -Recommended to continue current medication  Osteopenia (Goal prevent fractures) -Not ideally controlled Last DEXA Scan: 08/12/2017             T-Score femoral neck: RFN: -1.7; LFN: -2.0             T-Score lumbar spine: -1.8  T-Score total hip: n/a  T-Score forearm radius: n/a  10-year probability of major osteoporotic fracture: n/a  10-year probability of hip fracture: n/a -Patient is not a candidate for pharmacologic treatment -Current treatment  Vitamin D 2000 units daily Calcium daily -Medications previously tried: none  -Recommend [id number] units of vitamin D daily. Recommend 1200 mg of calcium daily from dietary and supplemental sources. Recommend weight-bearing and muscle strengthening exercises for building and maintaining bone density. -Counseled on taking calcium citrate 600 mg daily. Recommended repeat DEXA.  Hypothyroidism (Goal: TSH 2-4 (with  osteopenia)) -Controlled -Current treatment  Levothyroxine  163mg, 1 tablet once daily  -Medications previously tried: none  -Recommended to continue current medication  GERD (Goal: minimize symptoms) -Controlled -Current treatment  Famotidine 40 mg 1 tablet at bedtime Esomeprazole 40 mg  1 capsule before breakfast -Medications previously tried: none  -Recommended to continue current medication  Insomnia (Goal: improve quality and quantity of sleep) -Controlled -Current treatment  Melatonin 5 mg every night -Medications previously tried: none  -Recommended to continue current medication  Migraines (Goal: minimize pain) -Controlled -Current treatment  topiramate '25mg'$ , 1 tablet once daily  Butalbital- APAP- caffeine 50-325-'40mg'$ , 1 tablet as needed for headache -Medications previously tried: none  -Recommended to continue current medication Patient rarely takes butalbital and she is not sure if topiramate is helpful.   Health Maintenance -Vaccine gaps: COVID booster, influenza -Current therapy:  Lutein 25-5 mg 1 capsule daily Fexofenadine as needed -Educated on Cost vs benefit of each product must be carefully weighed by individual consumer -Patient is satisfied with current therapy and denies issues -Recommended to continue current medication  Patient Goals/Self-Care Activities Patient will:  - take medications as prescribed target a minimum of 150 minutes of moderate intensity exercise weekly engage in dietary modifications by increasing fiber intake and limiting fried foods  Follow Up Plan: Face to Face appointment with care management team member scheduled for:        Patient verbalizes understanding of instructions provided today and agrees to view in [facility]  The pharmacy team will reach out to the patient again over the next 30 days.   MViona Gilmore [facility]

## 2021-07-09 ENCOUNTER — Other Ambulatory Visit: Payer: Self-pay | Admitting: Adult Health

## 2021-07-09 ENCOUNTER — Telehealth: Payer: Self-pay | Admitting: Adult Health

## 2021-07-09 DIAGNOSIS — E785 Hyperlipidemia, unspecified: Secondary | ICD-10-CM

## 2021-07-09 DIAGNOSIS — E2839 Other primary ovarian failure: Secondary | ICD-10-CM

## 2021-07-09 NOTE — Telephone Encounter (Signed)
Left message asking pt to call me @ [phone]   per 07/09/21 email from [place] does 365 not calendar year  Need to r/s AWV  after 08/08/21

## 2021-07-16 ENCOUNTER — Other Ambulatory Visit: Payer: Self-pay

## 2021-07-16 ENCOUNTER — Ambulatory Visit
Admission: RE | Admit: 2021-07-16 | Discharge: 2021-07-16 | Disposition: A | Discharge status: Home / Self Care | Payer: Medicare HMO | Source: Ambulatory Visit | Attending: Adult Health | Admitting: Adult Health

## 2021-07-16 DIAGNOSIS — E2839 Other primary ovarian failure: Secondary | ICD-10-CM

## 2021-07-22 ENCOUNTER — Other Ambulatory Visit: Payer: Self-pay

## 2021-07-22 ENCOUNTER — Telehealth: Payer: Self-pay | Admitting: Adult Health

## 2021-07-22 ENCOUNTER — Ambulatory Visit (INDEPENDENT_AMBULATORY_CARE_PROVIDER_SITE_OTHER): Payer: Medicare HMO

## 2021-07-22 VITALS — BP 110/68 | HR 71 | Temp 98.3°F | Ht 59.0 in | Wt 172.0 lb

## 2021-07-22 DIAGNOSIS — Z0000 Encounter for general adult medical examination without abnormal findings: Secondary | ICD-10-CM | POA: Diagnosis not present

## 2021-07-22 NOTE — Progress Notes (Signed)
Subjective:   Jean Nash is a 80 y.o. female who presents for Medicare Annual (Subsequent) preventive examination.  Review of Systems    N/a       Objective:    There were no vitals filed for this visit. There is no height or weight on file to calculate BMI.  Advanced Directives 04/24/2021 12/28/2020 08/08/2020 04/09/2020 12/24/2019 08/24/2018 05/31/2018  Does Patient Have a Medical Advance Directive? No No Yes Yes No Yes Yes  Type of Advance Directive - Public librarian;Living will [place];Living will - - [place];Living will  Does patient want to make changes to medical advance directive? - - No - Patient declined - - - No - Patient declined  Copy of [place] in Chart? - - Yes - validated most recent copy scanned in chart (See row information) - - - No - copy requested  Would patient like information on creating a medical advance directive? - No - Patient declined - - - - -    Current Medications (verified) Outpatient Encounter Medications as of 07/22/2021  Medication Sig   butalbital-acetaminophen-caffeine (FIORICET) 50-325-40 MG tablet Take 1 tablet by mouth as needed for headache.   Calcium Carbonate-Vitamin D (CALCIUM-VITAMIN D3 PO) Take 1 tablet by mouth in the morning and at bedtime.   Cholecalciferol (VITAMIN D3) 50 MCG (2000 UT) TABS Take 2 tablets by mouth daily.   diazepam (VALIUM) 5 MG tablet Take 5 mg by mouth every 6 (six) hours as needed for anxiety. As needed (flying & dental)   esomeprazole (NEXIUM) 40 MG capsule Take 1 capsule (40 mg total) by mouth daily before breakfast. Take 1 capsule by mouth  daily   famotidine (PEPCID) 40 MG tablet TAKE ONE TABLET BY MOUTH EVERY NIGHT AT BEDTIME   levothyroxine (SYNTHROID) 100 MCG tablet TAKE ONE TABLET BY MOUTH DAILY   MELATONIN PO Take 1 tablet by mouth daily.   Multiple Vitamins-Minerals (ICAPS AREDS 2 PO) Take 1 capsule by mouth in the morning and at  bedtime.   omega-3 acid ethyl esters (LOVAZA) 1 g capsule Take 1 capsule (1 g total) by mouth 2 (two) times daily.   oxyCODONE-acetaminophen (PERCOCET) 5-325 MG tablet Take 0.5-1 tablets by mouth every 6 (six) hours as needed for severe pain (may cause constipation; do not take concurrently with tramadol or Fioricet).   Polyethylene Glycol 3350 (MIRALAX PO) Take by mouth. 1/2 capful every morning   simvastatin (ZOCOR) 20 MG tablet Take 1 tablet (20 mg total) by mouth at bedtime.   topiramate (TOPAMAX) 25 MG tablet TAKE ONE TABLET BY MOUTH DAILY   traMADol (ULTRAM) 50 MG tablet Take 50 mg by mouth every 6 (six) hours as needed.   venlafaxine XR (EFFEXOR-XR) 37.5 MG 24 hr capsule TAKE ONE CAPSULE BY MOUTH DAILY WITH BREAKFAST   No facility-administered encounter medications on file as of 07/22/2021.    Allergies (verified) Patient has no known allergies.   History: Past Medical History:  Diagnosis Date   Adenomatous colon polyp    Anxiety    PHOBIAS   Arthritis    Breast cancer ([place]) 08/08/13   right LOQ   Cataract    Chronic insomnia    Cluster headaches    history of migraines / NONE FOR SEVERAL YRS   Depression    Diverticulosis    Fatty liver 2011   Fibromyalgia    GERD (gastroesophageal reflux disease)    H/O hiatal hernia  History of transfusion 08/30/2013   Hx of radiation therapy 10/29/13- 12/14/13   right chest wall 5040 cGy 28 sessions, right supraclavicular/axillary region 5040 cGy 28 sessions, right chest wall boost 1000 cGy 5 sessions   Hypothyroidism    Internal hemorrhoids    Irritable bowel syndrome    Kidney stone    Lymphedema    RT ARM - WEARS SLEEVE   Macular degeneration    hole/right eye   MDD (major depressive disorder)    Osteopenia    Other abnormal glucose    Other and unspecified hyperlipidemia    Pain in joint, shoulder region    Pneumonia [id number]   Sleep apnea    USES C-PAP   Stress incontinence, female    Past Surgical History:   Procedure Laterality Date   ABDOMINAL HYSTERECTOMY     APPENDECTOMY     BILATERAL TOTAL MASTECTOMY WITH AXILLARY LYMPH NODE DISSECTION  08/30/2013   Dr Barry Dienes   BREAST CYST ASPIRATION     9 cysts   CATARACT EXTRACTION, BILATERAL  2005/2007   CHOLECYSTECTOMY     COLONOSCOPY     CYSTOCELE REPAIR     EVACUATION BREAST HEMATOMA Left 08/31/2013   Procedure: EVACUATION HEMATOMA BREAST;  Surgeon: Stark Klein, MD;  Location: [place] [place];  Service: General;  Laterality: Left;   EYE SURGERY     to repair macular hole   FOOT ARTHROPLASTY     lt    GANGLION CYST EXCISION     rt foot   HEMORRHOID SURGERY     03/1993   JOINT REPLACEMENT  03/15/11   left knee replacement   KNEE ARTHROSCOPY     /partial knee 2016/left knee 2012   MASS EXCISION  11/04/2011   Procedure: EXCISION MASS;  Surgeon: Cammie Sickle., MD;  Location: [place];  Service: Orthopedics;  Laterality: Right;  excisional biopsy right ulna mass   MASTECTOMY W/ SENTINEL NODE BIOPSY Right 08/30/2013   Procedure: RIGHT  AXILLARY SENTINEL LYMPH NODE BIOPSY; Right Axillary Node Disection;  Surgeon: Stark Klein, MD;  Location: [place];  Service: General;  Laterality: Right;  Right side nuc med 7:00    PARTIAL KNEE ARTHROPLASTY Right 11/03/2015   Procedure: RIGHT KNEE MEDIAL UNICOMPARTMENTAL ARTHROPLASTY ;  Surgeon: Gaynelle Arabian, MD;  Location: [facility];  Service: Orthopedics;  Laterality: Right;   RECTOCELE REPAIR     SIMPLE MASTECTOMY WITH AXILLARY SENTINEL NODE BIOPSY Left 08/30/2013   Procedure: Bilateral Breast Mastectomy ;  Surgeon: Stark Klein, MD;  Location: [place];  Service: General;  Laterality: Left;   skin tags removed     breast, panty line, neckline   TOE SURGERY     preventative crossover toe surg/right foot   TOE SURGERY  2009   left foot/screw  in 2nd toe   TONSILLECTOMY     UPPER GASTROINTESTINAL ENDOSCOPY     Family History  Problem Relation Age of Onset   Stroke Mother        died age 97    Diabetes Mother    Breast cancer Mother 58   Breast cancer Sister 71   Breast cancer Paternal Aunt 39   Diabetes Maternal Grandfather    Breast cancer Paternal Grandmother 66   Breast cancer Paternal Aunt        dx in her 13s   Cancer Maternal Grandmother        intra-abdominal cancer   Brain cancer Maternal Uncle 8   Brain cancer Cousin  65       maternal cousin   Brain cancer Cousin 53       paternal cousin   Colon cancer Neg Hx    Social History   Socioeconomic History   Marital status: Married    Spouse name: Fritz Pickerel   Number of children: 3   Years of education: Not on file   Highest education level: Not on file  Occupational History   Occupation: retired bookkeeper  Tobacco Use   Smoking status: Former    Packs/day: 0.10    Years: 2.00    Pack years: 0.20    Types: Cigarettes    Start date: 11/16/1959    Quit date: 11/15/1960    Years since quitting: 60.7   Smokeless tobacco: Never  Vaping Use   Vaping Use: Never used  Substance and Sexual Activity   Alcohol use: Yes    Comment: occ   Drug use: No   Sexual activity: Not on file    Comment: menarche age 25, fist live birth 23, P 3, hysterectomy age 43, no HRT, BCP 2 yrs  Other Topics Concern   Not on file  Social History Narrative   Occupation:  Retired Radiation protection practitioner    Married with 3 grown children      Never Smoked     Alcohol use-no        Social Determinants of Radio broadcast assistant Strain: Not on file  Food Insecurity: Not on file  Transportation Needs: No Transportation Needs   Lack of Transportation (Medical): No   Lack of Transportation (Non-Medical): No  Physical Activity: Sufficiently Active   Days of Exercise per Week: 7 days   Minutes of Exercise per Session: 60 min  Stress: No Stress Concern Present   Feeling of Stress : Not at all  Social Connections: Moderately Integrated   Frequency of Communication with Friends and Family: More than three times a week   Frequency of Social Gatherings  with Friends and Family: Twice a week   Attends Religious Services: More than 4 times per year   Active Member of [employer] or Organizations: No   Attends Archivist Meetings: Never   Marital Status: Married    Tobacco Counseling Counseling given: Not Answered   Clinical Intake:                 Diabetic?no         Activities of Daily Living In your present state of health, do you have any difficulty performing the following activities: 08/08/2020  Hearing? N  Vision? N  Difficulty concentrating or making decisions? N  Walking or climbing stairs? N  Dressing or bathing? N  Doing errands, shopping? Y  Comment Patient no longer drives anymore  Preparing Food and eating ? N  Using the Toilet? N  In the past six months, have you accidently leaked urine? Y  Comment has occassional bladder leakage  Do you have problems with loss of bowel control? N  Managing your Medications? N  Managing your Finances? N  Housekeeping or managing your Housekeeping? N  Some recent data might be hidden    Patient Care Team: [name], NP as PCP - General (Family Medicine) Susa Day, MD as Consulting Physician (Orthopedic Surgery) Earnie Larsson, [facility] as Pharmacist (Pharmacist)  Indicate any recent Medical Services you may have received from other than [facility] providers in the past year (date may be approximate).     Assessment:   This is a  routine wellness examination for Jean Nash.  Hearing/Vision screen No results found.  Dietary issues and exercise activities discussed:     Goals Addressed   None    Depression Screen PHQ 2/9 Scores 08/08/2020 07/20/2018 07/14/2017 11/28/2015 10/14/2014 10/01/2013  PHQ - 2 Score 0 0 0 0 1 0  PHQ- 9 Score 0 - 5 - - -    Fall Risk Fall Risk  08/27/2020 08/08/2020 07/20/2018 07/28/2017 07/14/2017  Falls in the past year? 0 0 No Yes No  Number falls in past yr: 0 0 - 1 -  Injury with Fall? 0 0 - Yes -  Risk for fall due to : -  Medication side effect - Other (Comment) -  Risk for fall due to: Comment - - - tripped over object on floor -  Follow up - Falls evaluation completed;Falls prevention discussed - - -    FALL RISK PREVENTION PERTAINING TO THE HOME:  Any stairs in or around the home? Yes  If so, are there any without handrails? No  Home free of loose throw rugs in walkways, pet beds, electrical cords, etc? Yes  Adequate lighting in your home to reduce risk of falls? Yes   ASSISTIVE DEVICES UTILIZED TO PREVENT FALLS:  Life alert? No  Use of a cane, walker or w/c? No  Grab bars in the bathroom? Yes  Shower chair or bench in shower? No  Elevated toilet seat or a handicapped toilet? No   TIMED UP AND GO:  Was the test performed? Yes .  Length of time to ambulate 10 feet: 10 sec.   Gait steady and fast without use of assistive device  Cognitive Function:    Normal cognitive status assessed by direct observation by this Nurse Health Advisor. No abnormalities found.      Immunizations Immunization History  Administered Date(s) Administered   Fluad Quad(high Dose 65+) 07/13/2019, 10/01/2020   Influenza Split 07/27/2012   Influenza Whole 09/13/2007, 08/08/2008, 07/30/2009, 08/25/2010   Influenza, High Dose Seasonal PF 08/03/2014, 08/08/2015, 08/23/2016, 07/14/2017, 07/20/2018   Influenza,inj,Quad PF,6+ Mos 07/25/2013   PFIZER(Purple Top)SARS-COV-2 Vaccination 12/23/2019, 01/17/2020, 08/18/2020   Pneumococcal Conjugate-13 08/16/2013   Pneumococcal Polysaccharide-23 06/18/2008   Tdap 11/29/2012   Zoster Recombinat (Shingrix) 04/11/2018, 09/22/2018   Zoster, Live 08/05/2011    TDAP status: Up to date  Flu Vaccine status: Up to date  Pneumococcal vaccine status: Up to date  Covid-19 vaccine status: Completed vaccines  Qualifies for Shingles Vaccine? Yes   Zostavax completed Yes   Shingrix Completed?: Yes  Screening Tests Health Maintenance  Topic Date Due   COVID-19 Vaccine (4 -  Booster for Pfizer series) 11/18/2020   INFLUENZA VACCINE  06/15/2021   TETANUS/TDAP  11/29/2022   DEXA SCAN  Completed   Hepatitis C Screening  Completed   PNA vac Low Risk Adult  Completed   Zoster Vaccines- Shingrix  Completed   HPV VACCINES  Aged Out    Health Maintenance  Health Maintenance Due  Topic Date Due   COVID-19 Vaccine (4 - Booster for Pfizer series) 11/18/2020   INFLUENZA VACCINE  06/15/2021    Colorectal cancer screening: No longer required.   Mammogram status: No longer required due to age.  Bone Density status: Completed 08/12/2017. Results reflect: Bone density results: OSTEOPENIA. Repeat every 5 years.  Lung Cancer Screening: (Low Dose CT Chest recommended if Age 31-80 years, 30 pack-year currently smoking OR have quit w/in 15years.) does not qualify.   Lung Cancer Screening Referral: n/a  Additional Screening:  Hepatitis C Screening: does not qualify;   Vision Screening: Recommended annual ophthalmology exams for early detection of glaucoma and other disorders of the eye. Is the patient up to date with their annual eye exam?  Yes  Who is the provider or what is the name of the office in which the patient attends annual eye exams? Dr.Ablot  If pt is not established with a provider, would they like to be referred to a provider to establish care? No .   Dental Screening: Recommended annual dental exams for proper oral hygiene  Community Resource Referral / Chronic Care Management: CRR required this visit?  No   CCM required this visit?  No      Plan:     I have personally reviewed and noted the following in the patient's chart:   Medical and social history Use of alcohol, tobacco or illicit drugs  Current medications and supplements including opioid prescriptions.  Functional ability and status Nutritional status Physical activity Advanced directives List of other physicians Hospitalizations, surgeries, and ER visits in previous 12  months Vitals Screenings to include cognitive, depression, and falls Referrals and appointments  In addition, I have reviewed and discussed with patient certain preventive protocols, quality metrics, and best practice recommendations. A written personalized care plan for preventive services as well as general preventive health recommendations were provided to patient.     Randel Pigg, LPN   [id number]   Nurse Notes: none

## 2021-07-22 NOTE — Patient Instructions (Signed)
Jean Nash , Thank you for taking time to come for your Medicare Wellness Visit. I appreciate your ongoing commitment to your health goals. Please review the following plan we discussed and let me know if I can assist you in the future.   Screening recommendations/referrals: Colonoscopy: no longer required  Mammogram: no longer required  Bone Density: 08/12/2017 Recommended yearly ophthalmology/optometry visit for glaucoma screening and checkup Recommended yearly dental visit for hygiene and checkup  Vaccinations: Influenza vaccine: due in fall 2022  Pneumococcal vaccine: completed series  Tdap vaccine: 11/29/2012 Shingles vaccine: completed series     Advanced directives: will provide copies   Conditions/risks identified: none   Next appointment:  none    Preventive Care 70 Years and Older, Female Preventive care refers to lifestyle choices and visits with your health care provider that can promote health and wellness. What does preventive care include? A yearly physical exam. This is also called an annual well check. Dental exams once or twice a year. Routine eye exams. Ask your health care provider how often you should have your eyes checked. Personal lifestyle choices, including: Daily care of your teeth and gums. Regular physical activity. Eating a healthy diet. Avoiding tobacco and drug use. Limiting alcohol use. Practicing safe sex. Taking low-dose aspirin every day. Taking vitamin and mineral supplements as recommended by your health care provider. What happens during an annual well check? The services and screenings done by your health care provider during your annual well check will depend on your age, overall health, lifestyle risk factors, and family history of disease. Counseling  Your health care provider may ask you questions about your: Alcohol use. Tobacco use. Drug use. Emotional well-being. Home and relationship well-being. Sexual activity. Eating  habits. History of falls. Memory and ability to understand (cognition). Work and work Statistician. Reproductive health. Screening  You may have the following tests or measurements: Height, weight, and BMI. Blood pressure. Lipid and cholesterol levels. These may be checked every 5 years, or more frequently if you are over 26 years old. Skin check. Lung cancer screening. You may have this screening every year starting at age 36 if you have a 30-pack-year history of smoking and currently smoke or have quit within the past 15 years. Fecal occult blood test (FOBT) of the stool. You may have this test every year starting at age 52. Flexible sigmoidoscopy or colonoscopy. You may have a sigmoidoscopy every 5 years or a colonoscopy every 10 years starting at age 67. Hepatitis C blood test. Hepatitis B blood test. Sexually transmitted disease (STD) testing. Diabetes screening. This is done by checking your blood sugar (glucose) after you have not eaten for a while (fasting). You may have this done every 1-3 years. Bone density scan. This is done to screen for osteoporosis. You may have this done starting at age 71. Mammogram. This may be done every 1-2 years. Talk to your health care provider about how often you should have regular mammograms. Talk with your health care provider about your test results, treatment options, and if necessary, the need for more tests. Vaccines  Your health care provider may recommend certain vaccines, such as: Influenza vaccine. This is recommended every year. Tetanus, diphtheria, and acellular pertussis (Tdap, Td) vaccine. You may need a Td booster every 10 years. Zoster vaccine. You may need this after age 56. Pneumococcal 13-valent conjugate (PCV13) vaccine. One dose is recommended after age 74. Pneumococcal polysaccharide (PPSV23) vaccine. One dose is recommended after age 60. Talk to your  health care provider about which screenings and vaccines you need and how  often you need them. This information is not intended to replace advice given to you by your health care provider. Make sure you discuss any questions you have with your health care provider. Document Released: 11/28/2015 Document Revised: 07/21/2016 Document Reviewed: 09/02/2015 Elsevier Interactive Patient Education  2017 [place] Prevention in the Home Falls can cause injuries. They can happen to people of all ages. There are many things you can do to make your home safe and to help prevent falls. What can I do on the outside of my home? Regularly fix the edges of walkways and driveways and fix any cracks. Remove anything that might make you trip as you walk through a door, such as a raised step or threshold. Trim any bushes or trees on the path to your home. Use bright outdoor lighting. Clear any walking paths of anything that might make someone trip, such as rocks or tools. Regularly check to see if handrails are loose or broken. Make sure that both sides of any steps have handrails. Any raised decks and porches should have guardrails on the edges. Have any leaves, snow, or ice cleared regularly. Use sand or salt on walking paths during winter. Clean up any spills in your garage right away. This includes oil or grease spills. What can I do in the bathroom? Use night lights. Install grab bars by the toilet and in the tub and shower. Do not use towel bars as grab bars. Use non-skid mats or decals in the tub or shower. If you need to sit down in the shower, use a plastic, non-slip stool. Keep the floor dry. Clean up any water that spills on the floor as soon as it happens. Remove soap buildup in the tub or shower regularly. Attach bath mats securely with double-sided non-slip rug tape. Do not have throw rugs and other things on the floor that can make you trip. What can I do in the bedroom? Use night lights. Make sure that you have a light by your bed that is easy to  reach. Do not use any sheets or blankets that are too big for your bed. They should not hang down onto the floor. Have a firm chair that has side arms. You can use this for support while you get dressed. Do not have throw rugs and other things on the floor that can make you trip. What can I do in the kitchen? Clean up any spills right away. Avoid walking on wet floors. Keep items that you use a lot in easy-to-reach places. If you need to reach something above you, use a strong step stool that has a grab bar. Keep electrical cords out of the way. Do not use floor polish or wax that makes floors slippery. If you must use wax, use non-skid floor wax. Do not have throw rugs and other things on the floor that can make you trip. What can I do with my stairs? Do not leave any items on the stairs. Make sure that there are handrails on both sides of the stairs and use them. Fix handrails that are broken or loose. Make sure that handrails are as long as the stairways. Check any carpeting to make sure that it is firmly attached to the stairs. Fix any carpet that is loose or worn. Avoid having throw rugs at the top or bottom of the stairs. If you do have throw rugs, attach them  to the floor with carpet tape. Make sure that you have a light switch at the top of the stairs and the bottom of the stairs. If you do not have them, ask someone to add them for you. What else can I do to help prevent falls? Wear shoes that: Do not have high heels. Have rubber bottoms. Are comfortable and fit you well. Are closed at the toe. Do not wear sandals. If you use a stepladder: Make sure that it is fully opened. Do not climb a closed stepladder. Make sure that both sides of the stepladder are locked into place. Ask someone to hold it for you, if possible. Clearly mark and make sure that you can see: Any grab bars or handrails. First and last steps. Where the edge of each step is. Use tools that help you move  around (mobility aids) if they are needed. These include: Canes. Walkers. Scooters. Crutches. Turn on the lights when you go into a dark area. Replace any light bulbs as soon as they burn out. Set up your furniture so you have a clear path. Avoid moving your furniture around. If any of your floors are uneven, fix them. If there are any pets around you, be aware of where they are. Review your medicines with your doctor. Some medicines can make you feel dizzy. This can increase your chance of falling. Ask your doctor what other things that you can do to help prevent falls. This information is not intended to replace advice given to you by your health care provider. Make sure you discuss any questions you have with your health care provider. Document Released: 08/28/2009 Document Revised: 04/08/2016 Document Reviewed: 12/06/2014 Elsevier Interactive Patient Education  2017 [employer].

## 2021-07-22 NOTE — Telephone Encounter (Signed)
Called pt to advised that Tommi Rumps was out of the office and will not return until next week. If pt is able this can wait until Tommi Rumps returns or until her visit in 2 weeks.

## 2021-07-22 NOTE — Telephone Encounter (Signed)
Patient states that she feels like something is in her ear, kinda like when you have fluid in her ear.  She states that sometimes she has crackling noise in her ear too like there is fluid there, however Dr. Elease Hashimoto looked in her ear and states that there is nothing there.  Patient is asking for a referral to see an ENT.

## 2021-07-29 NOTE — Telephone Encounter (Signed)
Pt has been scheduled.  °

## 2021-07-30 ENCOUNTER — Other Ambulatory Visit: Payer: Self-pay

## 2021-07-31 ENCOUNTER — Encounter: Payer: Self-pay | Admitting: Adult Health

## 2021-07-31 ENCOUNTER — Ambulatory Visit (INDEPENDENT_AMBULATORY_CARE_PROVIDER_SITE_OTHER): Payer: Medicare HMO | Admitting: Adult Health

## 2021-07-31 ENCOUNTER — Ambulatory Visit (INDEPENDENT_AMBULATORY_CARE_PROVIDER_SITE_OTHER)
Admission: RE | Admit: 2021-07-31 | Discharge: 2021-07-31 | Disposition: A | Discharge status: Home / Self Care | Payer: Medicare HMO | Source: Ambulatory Visit | Attending: Adult Health | Admitting: Adult Health

## 2021-07-31 VITALS — BP 132/86 | HR 81 | Temp 99.0°F | Ht 59.0 in | Wt 171.0 lb

## 2021-07-31 DIAGNOSIS — Z23 Encounter for immunization: Secondary | ICD-10-CM

## 2021-07-31 DIAGNOSIS — G8929 Other chronic pain: Secondary | ICD-10-CM

## 2021-07-31 DIAGNOSIS — R109 Unspecified abdominal pain: Secondary | ICD-10-CM

## 2021-07-31 DIAGNOSIS — N3945 Continuous leakage: Secondary | ICD-10-CM

## 2021-07-31 DIAGNOSIS — H9202 Otalgia, left ear: Secondary | ICD-10-CM

## 2021-07-31 DIAGNOSIS — R10A1 Flank pain, right side: Secondary | ICD-10-CM

## 2021-07-31 NOTE — Progress Notes (Signed)
Subjective:    [name], female    DOB: [birth year], 80 y.o.   MRN: [id number]  HPI  80 year old female who  has a past medical history of Adenomatous colon polyp, Anxiety, Arthritis, Breast cancer ([place]) (08/08/13), Cataract, Chronic insomnia, Cluster headaches, Depression, Diverticulosis, Fatty liver (2011), Fibromyalgia, GERD (gastroesophageal reflux disease), H/O hiatal hernia, History of transfusion (08/30/2013), radiation therapy (10/29/13- 12/14/13), Hypothyroidism, Internal hemorrhoids, Irritable bowel syndrome, Kidney stone, Lymphedema, Macular degeneration, MDD (major depressive disorder), Osteopenia, Other abnormal glucose, Other and unspecified hyperlipidemia, Pain in joint, shoulder region, Pneumonia [id number]), Sleep apnea, and Stress incontinence, female.  She presents with her husband today for evaluation of multiple issues   Left Ear pain -been evaluated in the past for this no clear-cut cause.  Has been present for multiple months.  Reports a "sharp stabbing" pain that happens at least once a day.  She denies ringing, drainage from the ear dizziness, lightheadedness, or abnormal headaches.  Sometimes she will feel a "swishing sound" in her left ear.  She is taking over-the-counter allergy medication and has not noticed any improvement.  She would like a referral to ear nose and throat Right sided upper flank/chest wall pain.  This has been an ongoing issue for multiple years.  Pain seems to be intermittent with movement.  She does have a history of right-sided breast cancer at the age of 68 with bilateral mastectomies.  She was treated with radiation.  At times the pain causes her to scream, and seems more apparent with certain range of motion such as reaching for something on the floor or reaching behind her back.  She has no difficulties with these range of motion's today. Long history of urinary incontinence.  She would like to be referred to urology or GYN to see if  there is anything that can be done to mitigate her symptoms  Review of Systems See HPI   Past Medical History:  Diagnosis Date   Adenomatous colon polyp    Anxiety    PHOBIAS   Arthritis    Breast cancer ([facility]) 08/08/13   right LOQ   Cataract    Chronic insomnia    Cluster headaches    history of migraines / NONE FOR SEVERAL YRS   Depression    Diverticulosis    Fatty liver 2011   Fibromyalgia    GERD (gastroesophageal reflux disease)    H/O hiatal hernia    History of transfusion 08/30/2013   Hx of radiation therapy 10/29/13- 12/14/13   right chest wall 5040 cGy 28 sessions, right supraclavicular/axillary region 5040 cGy 28 sessions, right chest wall boost 1000 cGy 5 sessions   Hypothyroidism    Internal hemorrhoids    Irritable bowel syndrome    Kidney stone    Lymphedema    RT ARM - WEARS SLEEVE   Macular degeneration    hole/right eye   MDD (major depressive disorder)    Osteopenia    Other abnormal glucose    Other and unspecified hyperlipidemia    Pain in joint, shoulder region    Pneumonia [id number]   Sleep apnea    USES C-PAP   Stress incontinence, female     Social History   Socioeconomic History   Marital status: Married    Spouse name: Fritz Pickerel   Number of children: 3   Years of education: Not on file   Highest education level: Not on file  Occupational History   Occupation: retired Radiation protection practitioner  Tobacco  Use   Smoking status: Former    Packs/day: 0.10    Years: 2.00    Pack years: 0.20    Types: Cigarettes    Start date: 11/16/1959    Quit date: 11/15/1960    Years since quitting: 60.7   Smokeless tobacco: Never  Vaping Use   Vaping Use: Never used  Substance and Sexual Activity   Alcohol use: Yes    Comment: occ   Drug use: No   Sexual activity: Not on file    Comment: menarche age 72, fist live birth 60, P 3, hysterectomy age 41, no HRT, BCP 2 yrs  Other Topics Concern   Not on file  Social History Narrative   Occupation:  Retired  Radiation protection practitioner    Married with 3 grown children      Never Smoked     Alcohol use-no        Social Determinants of Radio broadcast assistant Strain: Low Risk    Difficulty of Paying Living Expenses: Not hard at all  Food Insecurity: No Food Insecurity   Worried About Charity fundraiser in the Last Year: Never true   Arboriculturist in the Last Year: Never true  Transportation Needs: No Transportation Needs   Lack of Transportation (Medical): No   Lack of Transportation (Non-Medical): No  Physical Activity: Insufficiently Active   Days of Exercise per Week: 3 days   Minutes of Exercise per Session: 30 min  Stress: No Stress Concern Present   Feeling of Stress : Not at all  Social Connections: Socially Integrated   Frequency of Communication with Friends and Family: Three times a week   Frequency of Social Gatherings with Friends and Family: Three times a week   Attends Religious Services: More than 4 times per year   Active Member of Clubs or Organizations: Yes   Attends Music therapist: More than 4 times per year   Marital Status: Married  Human resources officer Violence: Not At Risk   Fear of Current or Ex-Partner: No   Emotionally Abused: No   Physically Abused: No   Sexually Abused: No    Past Surgical History:  Procedure Laterality Date   ABDOMINAL HYSTERECTOMY     APPENDECTOMY     BILATERAL TOTAL MASTECTOMY WITH AXILLARY LYMPH NODE DISSECTION  08/30/2013   Dr Barry Dienes   BREAST CYST ASPIRATION     9 cysts   CATARACT EXTRACTION, BILATERAL  2005/2007   CHOLECYSTECTOMY     COLONOSCOPY     CYSTOCELE REPAIR     EVACUATION BREAST HEMATOMA Left 08/31/2013   Procedure: EVACUATION HEMATOMA BREAST;  Surgeon: Stark Klein, MD;  Location: [facility];  Service: General;  Laterality: Left;   EYE SURGERY     to repair macular hole   FOOT ARTHROPLASTY     lt    GANGLION CYST EXCISION     rt foot   HEMORRHOID SURGERY     03/1993   JOINT REPLACEMENT  03/15/11   left knee  replacement   KNEE ARTHROSCOPY     /partial knee 2016/left knee 2012   MASS EXCISION  11/04/2011   Procedure: EXCISION MASS;  Surgeon: Cammie Sickle., MD;  Location: [facility];  Service: Orthopedics;  Laterality: Right;  excisional biopsy right ulna mass   MASTECTOMY W/ SENTINEL NODE BIOPSY Right 08/30/2013   Procedure: RIGHT  AXILLARY SENTINEL LYMPH NODE BIOPSY; Right Axillary Node Disection;  Surgeon: Stark Klein, MD;  Location: [facility];  Service: General;  Laterality: Right;  Right side nuc med 7:00    PARTIAL KNEE ARTHROPLASTY Right 11/03/2015   Procedure: RIGHT KNEE MEDIAL UNICOMPARTMENTAL ARTHROPLASTY ;  Surgeon: Gaynelle Arabian, MD;  Location: [facility];  Service: Orthopedics;  Laterality: Right;   RECTOCELE REPAIR     SIMPLE MASTECTOMY WITH AXILLARY SENTINEL NODE BIOPSY Left 08/30/2013   Procedure: Bilateral Breast Mastectomy ;  Surgeon: Stark Klein, MD;  Location: [facility];  Service: General;  Laterality: Left;   skin tags removed     breast, panty line, neckline   TOE SURGERY     preventative crossover toe surg/right foot   TOE SURGERY  2009   left foot/screw  in 2nd toe   TONSILLECTOMY     UPPER GASTROINTESTINAL ENDOSCOPY      Family History  Problem Relation Age of Onset   Stroke Mother        died age 68   Diabetes Mother    Breast cancer Mother 86   Breast cancer Sister 62   Breast cancer Paternal Aunt 74   Diabetes Maternal Grandfather    Breast cancer Paternal Grandmother 52   Breast cancer Paternal Aunt        dx in her 42s   Cancer Maternal Grandmother        intra-abdominal cancer   Brain cancer Maternal Uncle 8   Brain cancer Cousin 66       maternal cousin   Brain cancer Cousin 74       paternal cousin   Colon cancer Neg Hx     No Known Allergies  Current Outpatient Medications on File Prior to Visit  Medication Sig Dispense Refill   butalbital-acetaminophen-caffeine (FIORICET) 50-325-40 MG tablet Take 1 tablet by mouth as needed for  headache. 30 tablet 1   Calcium Carbonate-Vitamin D (CALCIUM-VITAMIN D3 PO) Take 1 tablet by mouth in the morning and at bedtime.     Cholecalciferol (VITAMIN D3) 50 MCG (2000 UT) TABS Take 2 tablets by mouth daily.     diazepam (VALIUM) 5 MG tablet Take 5 mg by mouth every 6 (six) hours as needed for anxiety. As needed (flying & dental)     esomeprazole (NEXIUM) 40 MG capsule Take 1 capsule (40 mg total) by mouth daily before breakfast. Take 1 capsule by mouth  daily 365 capsule 0   famotidine (PEPCID) 40 MG tablet TAKE ONE TABLET BY MOUTH EVERY NIGHT AT BEDTIME 90 tablet 1   levothyroxine (SYNTHROID) 100 MCG tablet TAKE ONE TABLET BY MOUTH DAILY 365 tablet 0   MELATONIN PO Take 1 tablet by mouth daily.     Multiple Vitamins-Minerals (ICAPS AREDS 2 PO) Take 1 capsule by mouth in the morning and at bedtime.     oxyCODONE-acetaminophen (PERCOCET) 5-325 MG tablet Take 0.5-1 tablets by mouth every 6 (six) hours as needed for severe pain (may cause constipation; do not take concurrently with tramadol or Fioricet). 10 tablet 0   Polyethylene Glycol 3350 (MIRALAX PO) Take by mouth. 1/2 capful every morning     simvastatin (ZOCOR) 20 MG tablet Take 1 tablet (20 mg total) by mouth at bedtime. 360 tablet 0   topiramate (TOPAMAX) 25 MG tablet TAKE ONE TABLET BY MOUTH DAILY 325 tablet 0   traMADol (ULTRAM) 50 MG tablet Take 50 mg by mouth every 6 (six) hours as needed.     venlafaxine XR (EFFEXOR-XR) 37.5 MG 24 hr capsule TAKE ONE CAPSULE BY MOUTH DAILY WITH BREAKFAST  90 capsule 1   omega-3 acid ethyl esters (LOVAZA) 1 g capsule Take 1 capsule (1 g total) by mouth 2 (two) times daily. 180 capsule 3   No current facility-administered medications on file prior to visit.    BP 132/86   Pulse 81   Temp 99 F (37.2 C) (Oral)   Ht '4\' 11"'$  (1.499 m)   Wt 171 lb (77.6 kg)   SpO2 99%   BMI 34.54 kg/m       Objective:   Physical Exam Vitals and nursing note reviewed. Exam conducted with a chaperone  present.  Constitutional:      Appearance: Normal appearance.  HENT:     Right Ear: Tympanic membrane, ear canal and external ear normal. There is no impacted cerumen.     Left Ear: Tympanic membrane, ear canal and external ear normal. There is no impacted cerumen.  Cardiovascular:     Rate and Rhythm: Normal rate and regular rhythm.     Pulses: Normal pulses.     Heart sounds: Normal heart sounds.  Pulmonary:     Effort: Pulmonary effort is normal.     Breath sounds: Normal breath sounds.  Chest:     Chest wall: Tenderness present.       Comments: Tenderness to palpation at location of radiotherapy skin marker on right chest wall  Abdominal:     General: Abdomen is flat. Bowel sounds are normal.     Palpations: Abdomen is soft.  Neurological:     Mental Status: She is alert.       Assessment & Plan:  1. Right flank pain, chronic - Possibly scar tissue vs chronic radiotherapy pain vs recurrent CA. Will start with Chest xray and consider further imaging if needed  - DG Chest 2 View; Future  2. Need for immunization against influenza  - Flu Vaccine QUAD High Dose(Fluad)  3. Left ear pain  - Ambulatory referral to ENT  4. Continuous leakage of urine  - Ambulatory referral to Urogynecology  [name], NP

## 2021-08-04 ENCOUNTER — Telehealth: Payer: Self-pay | Admitting: Adult Health

## 2021-08-04 ENCOUNTER — Encounter: Payer: Self-pay | Admitting: Adult Health

## 2021-08-04 DIAGNOSIS — R0782 Intercostal pain: Secondary | ICD-10-CM

## 2021-08-04 DIAGNOSIS — R0789 Other chest pain: Secondary | ICD-10-CM

## 2021-08-04 NOTE — Telephone Encounter (Signed)
Spoke to patient and informed of chest xray results.  Was nothing on chest x-ray to explain her right chest wall pain.  Due to history of right-sided breast cancer will order CT scan.

## 2021-08-05 ENCOUNTER — Other Ambulatory Visit: Payer: Self-pay

## 2021-08-06 ENCOUNTER — Encounter: Payer: Self-pay | Admitting: Adult Health

## 2021-08-06 ENCOUNTER — Ambulatory Visit (INDEPENDENT_AMBULATORY_CARE_PROVIDER_SITE_OTHER): Payer: Medicare HMO | Admitting: Adult Health

## 2021-08-06 VITALS — BP 140/82 | HR 72 | Temp 98.1°F | Ht 59.0 in | Wt 171.0 lb

## 2021-08-06 DIAGNOSIS — R0789 Other chest pain: Secondary | ICD-10-CM

## 2021-08-06 DIAGNOSIS — M797 Fibromyalgia: Secondary | ICD-10-CM | POA: Diagnosis not present

## 2021-08-06 MED ORDER — PREGABALIN 150 MG PO CAPS: 150.0000 mg | ORAL_CAPSULE | Freq: Every day | ORAL | 0 refills | Status: DC

## 2021-08-06 NOTE — Progress Notes (Signed)
Subjective:    [name], female    DOB: February 20, 1941, 80 y.o.   MRN: [id number]  HPI  80 year old female who  has a past medical history of Adenomatous colon polyp, Anxiety, Arthritis, Breast cancer ([place]) (08/08/13), Cataract, Chronic insomnia, Cluster headaches, Depression, Diverticulosis, Fatty liver (2011), Fibromyalgia, GERD (gastroesophageal reflux disease), H/O hiatal hernia, History of transfusion (08/30/2013), radiation therapy (10/29/13- 12/14/13), Hypothyroidism, Internal hemorrhoids, Irritable bowel syndrome, Kidney stone, Lymphedema, Macular degeneration, MDD (major depressive disorder), Osteopenia, Other abnormal glucose, Other and unspecified hyperlipidemia, Pain in joint, shoulder region, Pneumonia (8144,8185), Sleep apnea, and Stress incontinence, female.  She presents to the office today with her husband.  She was seen last week for right flank/chest wall pain.  This has been an ongoing issue for multiple years.  States pain is intermittent and seems to be more consistent with movements.  She does have a history of right-sided breast cancer at the age of 76 with bilateral mastectomies.  Her chest x-ray was unremarkable and CT scan was ordered due to her past medical history.  She reports that the night prior her husband woke her up that she was crying in her sleep due to the pain.  She has no pain today on the right flank but does have pain in multiple areas throughout her body including shoulders and in the hands.  In the past she was on Lyrica for fibromyalgia pain but came off of this a couple years ago.  She is wondering if what she is experiencing is more related to her fibromyalgia.  Review of Systems See HPI   Past Medical History:  Diagnosis Date  . Adenomatous colon polyp   . Anxiety    PHOBIAS  . Arthritis   . Breast cancer ([facility]) 08/08/13   right LOQ  . Cataract   . Chronic insomnia   . Cluster headaches    history of migraines / NONE FOR SEVERAL  YRS  . Depression   . Diverticulosis   . Fatty liver 2011  . Fibromyalgia   . GERD (gastroesophageal reflux disease)   . H/O hiatal hernia   . History of transfusion 08/30/2013  . Hx of radiation therapy 10/29/13- 12/14/13   right chest wall 5040 cGy 28 sessions, right supraclavicular/axillary region 5040 cGy 28 sessions, right chest wall boost 1000 cGy 5 sessions  . Hypothyroidism   . Internal hemorrhoids   . Irritable bowel syndrome   . Kidney stone   . Lymphedema    RT ARM - WEARS SLEEVE  . Macular degeneration    hole/right eye  . MDD (major depressive disorder)   . Osteopenia   . Other abnormal glucose   . Other and unspecified hyperlipidemia   . Pain in joint, shoulder region   . Pneumonia 6314,9702  . Sleep apnea    USES C-PAP  . Stress incontinence, female     Social History   Socioeconomic History  . Marital status: Married    Spouse name: Fritz Pickerel  . Number of children: 3  . Years of education: Not on file  . Highest education level: Not on file  Occupational History  . Occupation: retired bookkeeper  Tobacco Use  . Smoking status: Former    Packs/day: 0.10    Years: 2.00    Pack years: 0.20    Types: Cigarettes    Start date: 11/16/1959    Quit date: 11/15/1960    Years since quitting: 60.7  . Smokeless tobacco: Never  Vaping  Use  . Vaping Use: Never used  Substance and Sexual Activity  . Alcohol use: Yes    Comment: occ  . Drug use: No  . Sexual activity: Not on file    Comment: menarche age 84, fist live birth 28, P 3, hysterectomy age 53, no HRT, BCP 2 yrs  Other Topics Concern  . Not on file  Social History Narrative   Occupation:  Retired Radiation protection practitioner    Married with 3 grown children      Never Smoked     Alcohol use-no        Social Determinants of Radio broadcast assistant Strain: Low Risk   . Difficulty of Paying Living Expenses: Not hard at all  Food Insecurity: No Food Insecurity  . Worried About Charity fundraiser in the Last Year:  Never true  . Ran Out of Food in the Last Year: Never true  Transportation Needs: No Transportation Needs  . Lack of Transportation (Medical): No  . Lack of Transportation (Non-Medical): No  Physical Activity: Insufficiently Active  . Days of Exercise per Week: 3 days  . Minutes of Exercise per Session: 30 min  Stress: No Stress Concern Present  . Feeling of Stress : Not at all  Social Connections: Socially Integrated  . Frequency of Communication with Friends and Family: Three times a week  . Frequency of Social Gatherings with Friends and Family: Three times a week  . Attends Religious Services: More than 4 times per year  . Active Member of Clubs or Organizations: Yes  . Attends Archivist Meetings: More than 4 times per year  . Marital Status: Married  Human resources officer Violence: Not At Risk  . Fear of Current or Ex-Partner: No  . Emotionally Abused: No  . Physically Abused: No  . Sexually Abused: No    Past Surgical History:  Procedure Laterality Date  . ABDOMINAL HYSTERECTOMY    . APPENDECTOMY    . BILATERAL TOTAL MASTECTOMY WITH AXILLARY LYMPH NODE DISSECTION  08/30/2013   Dr Barry Dienes  . BREAST CYST ASPIRATION     9 cysts  . CATARACT EXTRACTION, BILATERAL  2005/2007  . CHOLECYSTECTOMY    . COLONOSCOPY    . CYSTOCELE REPAIR    . EVACUATION BREAST HEMATOMA Left 08/31/2013   Procedure: EVACUATION HEMATOMA BREAST;  Surgeon: Stark Klein, MD;  Location: [facility];  Service: General;  Laterality: Left;  . EYE SURGERY     to repair macular hole  . FOOT ARTHROPLASTY     lt   . GANGLION CYST EXCISION     rt foot  . HEMORRHOID SURGERY     03/1993  . JOINT REPLACEMENT  03/15/11   left knee replacement  . KNEE ARTHROSCOPY     /partial knee 2016/left knee 2012  . MASS EXCISION  11/04/2011   Procedure: EXCISION MASS;  Surgeon: Cammie Sickle., MD;  Location: [facility];  Service: Orthopedics;  Laterality: Right;  excisional biopsy right ulna mass  .  MASTECTOMY W/ SENTINEL NODE BIOPSY Right 08/30/2013   Procedure: RIGHT  AXILLARY SENTINEL LYMPH NODE BIOPSY; Right Axillary Node Disection;  Surgeon: Stark Klein, MD;  Location: [facility];  Service: General;  Laterality: Right;  Right side nuc med 7:00   . PARTIAL KNEE ARTHROPLASTY Right 11/03/2015   Procedure: RIGHT KNEE MEDIAL UNICOMPARTMENTAL ARTHROPLASTY ;  Surgeon: Gaynelle Arabian, MD;  Location: [facility];  Service: Orthopedics;  Laterality: Right;  . RECTOCELE REPAIR    .  SIMPLE MASTECTOMY WITH AXILLARY SENTINEL NODE BIOPSY Left 08/30/2013   Procedure: Bilateral Breast Mastectomy ;  Surgeon: Stark Klein, MD;  Location: [facility];  Service: General;  Laterality: Left;  . skin tags removed     breast, panty line, neckline  . TOE SURGERY     preventative crossover toe surg/right foot  . TOE SURGERY  2009   left foot/screw  in 2nd toe  . TONSILLECTOMY    . UPPER GASTROINTESTINAL ENDOSCOPY      Family History  Problem Relation Age of Onset  . Stroke Mother        died age 46  . Diabetes Mother   . Breast cancer Mother 48  . Breast cancer Sister 36  . Breast cancer Paternal Aunt 8  . Diabetes Maternal Grandfather   . Breast cancer Paternal Grandmother 73  . Breast cancer Paternal Aunt        dx in her 85s  . Cancer Maternal Grandmother        intra-abdominal cancer  . Brain cancer Maternal Uncle 8  . Brain cancer Cousin 54       maternal cousin  . Brain cancer Cousin 20       paternal cousin  . Colon cancer Neg Hx     No Known Allergies  Current Outpatient Medications on File Prior to Visit  Medication Sig Dispense Refill  . Bacillus Coagulans-Inulin (PROBIOTIC) 1-250 BILLION-MG CAPS Take by mouth.    . butalbital-acetaminophen-caffeine (FIORICET) 50-325-40 MG tablet Take 1 tablet by mouth as needed for headache. 30 tablet 1  . Calcium Carbonate-Vitamin D (CALCIUM-VITAMIN D3 PO) Take 1 tablet by mouth in the morning and at bedtime.    . Cholecalciferol (VITAMIN D3) 50 MCG (2000  UT) TABS Take 2 tablets by mouth daily.    . diazepam (VALIUM) 5 MG tablet Take 5 mg by mouth every 6 (six) hours as needed for anxiety. As needed (flying & dental)    . esomeprazole (NEXIUM) 40 MG capsule Take 1 capsule (40 mg total) by mouth daily before breakfast. Take 1 capsule by mouth  daily 365 capsule 0  . famotidine (PEPCID) 40 MG tablet TAKE ONE TABLET BY MOUTH EVERY NIGHT AT BEDTIME 90 tablet 1  . levothyroxine (SYNTHROID) 100 MCG tablet TAKE ONE TABLET BY MOUTH DAILY 365 tablet 0  . MELATONIN PO Take 1 tablet by mouth daily.    . Multiple Vitamins-Minerals (ICAPS AREDS 2 PO) Take 1 capsule by mouth in the morning and at bedtime.    Marland Kitchen oxyCODONE-acetaminophen (PERCOCET) 5-325 MG tablet Take 0.5-1 tablets by mouth every 6 (six) hours as needed for severe pain (may cause constipation; do not take concurrently with tramadol or Fioricet). 10 tablet 0  . Polyethylene Glycol 3350 (MIRALAX PO) Take by mouth. 1/2 capful every morning    . simvastatin (ZOCOR) 20 MG tablet Take 1 tablet (20 mg total) by mouth at bedtime. 360 tablet 0  . topiramate (TOPAMAX) 25 MG tablet TAKE ONE TABLET BY MOUTH DAILY 325 tablet 0  . traMADol (ULTRAM) 50 MG tablet Take 50 mg by mouth every 6 (six) hours as needed.    . venlafaxine XR (EFFEXOR-XR) 37.5 MG 24 hr capsule TAKE ONE CAPSULE BY MOUTH DAILY WITH BREAKFAST 90 capsule 1  . omega-3 acid ethyl esters (LOVAZA) 1 g capsule Take 1 capsule (1 g total) by mouth 2 (two) times daily. 180 capsule 3   No current facility-administered medications on file prior to visit.  BP 140/82   Pulse 72   Temp 98.1 F (36.7 C) (Oral)   Ht 4\' 11"  (1.499 m)   Wt 171 lb (77.6 kg)   SpO2 97%   BMI 34.54 kg/m       Objective:   Physical Exam Vitals and nursing note reviewed.  Constitutional:      Appearance: Normal appearance.  Cardiovascular:     Rate and Rhythm: Normal rate and regular rhythm.     Pulses: Normal pulses.     Heart sounds: Normal heart sounds.   Pulmonary:     Effort: Pulmonary effort is normal.     Breath sounds: Normal breath sounds.  Musculoskeletal:        General: Tenderness present.     Right shoulder: Tenderness present. No bony tenderness or crepitus. Normal strength. Normal pulse.     Left shoulder: Tenderness and bony tenderness present. No crepitus. Normal strength. Normal pulse.     Right wrist: Bony tenderness present.     Left wrist: No bony tenderness.     Right knee: Bony tenderness present.     Left knee: Bony tenderness present.  Skin:    General: Skin is warm and dry.     Capillary Refill: Capillary refill takes less than 2 seconds.  Neurological:     General: No focal deficit present.     Mental Status: She is alert and oriented to person, place, and time.  Psychiatric:        Mood and Affect: Mood normal.        Behavior: Behavior normal.        Thought Content: Thought content normal.        Judgment: Judgment normal.       Assessment & Plan:   1. Right-sided chest wall pain -We will put her back on Lyrica 150 mg daily to see if this helps with her chronic pain.  Advise follow-up if no improvement in the next 1 to 2 weeks. - pregabalin (LYRICA) 150 MG capsule; Take 1 capsule (150 mg total) by mouth daily.  Dispense: 90 capsule; Refill: 0  2. Fibromyalgia  - pregabalin (LYRICA) 150 MG capsule; Take 1 capsule (150 mg total) by mouth daily.  Dispense: 90 capsule; Refill: 0  [name], NP

## 2021-08-06 NOTE — Telephone Encounter (Signed)
Please advise 

## 2021-08-14 ENCOUNTER — Encounter: Payer: Self-pay | Admitting: Adult Health

## 2021-08-26 ENCOUNTER — Ambulatory Visit (INDEPENDENT_AMBULATORY_CARE_PROVIDER_SITE_OTHER): Payer: Medicare HMO | Admitting: Otolaryngology

## 2021-08-26 ENCOUNTER — Other Ambulatory Visit: Payer: Self-pay

## 2021-08-26 DIAGNOSIS — M26609 Unspecified temporomandibular joint disorder, unspecified side: Secondary | ICD-10-CM

## 2021-08-26 DIAGNOSIS — H6123 Impacted cerumen, bilateral: Secondary | ICD-10-CM | POA: Diagnosis not present

## 2021-08-26 DIAGNOSIS — H9202 Otalgia, left ear: Secondary | ICD-10-CM

## 2021-08-26 NOTE — Progress Notes (Signed)
HPI: Jean Nash is a 80 y.o. female who presents is referred by her PCP for evaluation of left ear pain.  She uses Q-tips on a regular basis.  She states that she had some bleeding from the left ear.  She has not noted that much hearing problems..  Past Medical History:  Diagnosis Date   Adenomatous colon polyp    Anxiety    PHOBIAS   Arthritis    Breast cancer ([facility]) 08/08/13   right LOQ   Cataract    Chronic insomnia    Cluster headaches    history of migraines / NONE FOR SEVERAL YRS   Depression    Diverticulosis    Fatty liver 2011   Fibromyalgia    GERD (gastroesophageal reflux disease)    H/O hiatal hernia    History of transfusion 08/30/2013   Hx of radiation therapy 10/29/13- 12/14/13   right chest wall 5040 cGy 28 sessions, right supraclavicular/axillary region 5040 cGy 28 sessions, right chest wall boost 1000 cGy 5 sessions   Hypothyroidism    Internal hemorrhoids    Irritable bowel syndrome    Kidney stone    Lymphedema    RT ARM - WEARS SLEEVE   Macular degeneration    hole/right eye   MDD (major depressive disorder)    Osteopenia    Other abnormal glucose    Other and unspecified hyperlipidemia    Pain in joint, shoulder region    Pneumonia 3903,0092   Sleep apnea    USES C-PAP   Stress incontinence, female    Past Surgical History:  Procedure Laterality Date   ABDOMINAL HYSTERECTOMY     APPENDECTOMY     BILATERAL TOTAL MASTECTOMY WITH AXILLARY LYMPH NODE DISSECTION  08/30/2013   Dr Barry Dienes   BREAST CYST ASPIRATION     9 cysts   CATARACT EXTRACTION, BILATERAL  2005/2007   CHOLECYSTECTOMY     COLONOSCOPY     CYSTOCELE REPAIR     EVACUATION BREAST HEMATOMA Left 08/31/2013   Procedure: EVACUATION HEMATOMA BREAST;  Surgeon: Stark Klein, MD;  Location: [facility];  Service: General;  Laterality: Left;   EYE SURGERY     to repair macular hole   FOOT ARTHROPLASTY     lt    GANGLION CYST EXCISION     rt foot   HEMORRHOID SURGERY     03/1993   JOINT  REPLACEMENT  03/15/11   left knee replacement   KNEE ARTHROSCOPY     /partial knee 2016/left knee 2012   MASS EXCISION  11/04/2011   Procedure: EXCISION MASS;  Surgeon: Cammie Sickle., MD;  Location: [facility];  Service: Orthopedics;  Laterality: Right;  excisional biopsy right ulna mass   MASTECTOMY W/ SENTINEL NODE BIOPSY Right 08/30/2013   Procedure: RIGHT  AXILLARY SENTINEL LYMPH NODE BIOPSY; Right Axillary Node Disection;  Surgeon: Stark Klein, MD;  Location: [facility];  Service: General;  Laterality: Right;  Right side nuc med 7:00    PARTIAL KNEE ARTHROPLASTY Right 11/03/2015   Procedure: RIGHT KNEE MEDIAL UNICOMPARTMENTAL ARTHROPLASTY ;  Surgeon: Gaynelle Arabian, MD;  [facility];  Service: Orthopedics;  Laterality: Right;   RECTOCELE REPAIR     SIMPLE MASTECTOMY WITH AXILLARY SENTINEL NODE BIOPSY Left 08/30/2013   Procedure: Bilateral Breast Mastectomy ;  Surgeon: Stark Klein, MD;  Location: [facility];  Service: General;  Laterality: Left;   skin tags removed     breast, panty line, neckline   TOE SURGERY  preventative crossover toe surg/right foot   TOE SURGERY  2009   left foot/screw  in 2nd toe   TONSILLECTOMY     UPPER GASTROINTESTINAL ENDOSCOPY     Social History   Socioeconomic History   Marital status: Married    Spouse name: Fritz Pickerel   Number of children: 3   Years of education: Not on file   Highest education level: Not on file  Occupational History   Occupation: retired bookkeeper  Tobacco Use   Smoking status: Former    Packs/day: 0.10    Years: 2.00    Pack years: 0.20    Types: Cigarettes    Start date: 11/16/1959    Quit date: 11/15/1960    Years since quitting: 60.8   Smokeless tobacco: Never  Vaping Use   Vaping Use: Never used  Substance and Sexual Activity   Alcohol use: Yes    Comment: occ   Drug use: No   Sexual activity: Not on file    Comment: menarche age 38, fist live birth 18, P 3, hysterectomy age 71, no HRT, BCP 2 yrs   Other Topics Concern   Not on file  Social History Narrative   Occupation:  Retired Radiation protection practitioner    Married with 3 grown children      Never Smoked     Alcohol use-no        Social Determinants of Radio broadcast assistant Strain: Low Risk    Difficulty of Paying Living Expenses: Not hard at all  Food Insecurity: No Food Insecurity   Worried About Charity fundraiser in the Last Year: Never true   Arboriculturist in the Last Year: Never true  Transportation Needs: No Transportation Needs   Lack of Transportation (Medical): No   Lack of Transportation (Non-Medical): No  Physical Activity: Insufficiently Active   Days of Exercise per Week: 3 days   Minutes of Exercise per Session: 30 min  Stress: Not on file  Social Connections: Socially Integrated   Frequency of Communication with Friends and Family: Three times a week   Frequency of Social Gatherings with Friends and Family: Three times a week   Attends Religious Services: More than 4 times per year   Active Member of Clubs or Organizations: Yes   Attends Music therapist: More than 4 times per year   Marital Status: Married   Family History  Problem Relation Age of Onset   Stroke Mother        died age 68   Diabetes Mother    Breast cancer Mother 31   Breast cancer Sister 36   Breast cancer Paternal Aunt 41   Diabetes Maternal Grandfather    Breast cancer Paternal Grandmother 56   Breast cancer Paternal Aunt        dx in her 29s   Cancer Maternal Grandmother        intra-abdominal cancer   Brain cancer Maternal Uncle 17   Brain cancer Cousin 49       maternal cousin   Brain cancer Cousin 61       paternal cousin   Colon cancer Neg Hx    No Known Allergies Prior to Admission medications   Medication Sig Start Date End Date Taking? Authorizing Provider  Bacillus Coagulans-Inulin (PROBIOTIC) 1-250 BILLION-MG CAPS Take by mouth.    [provider]  butalbital-acetaminophen-caffeine (FIORICET)  50-325-40 MG tablet Take 1 tablet by mouth as needed for headache. 03/06/21  Nafziger, Tommi Rumps, NP  Calcium Carbonate-Vitamin D (CALCIUM-VITAMIN D3 PO) Take 1 tablet by mouth in the morning and at bedtime.    [provider]  Cholecalciferol (VITAMIN D3) 50 MCG (2000 UT) TABS Take 2 tablets by mouth daily.    [provider]  diazepam (VALIUM) 5 MG tablet Take 5 mg by mouth every 6 (six) hours as needed for anxiety. As needed (flying & dental)    [provider]  esomeprazole (NEXIUM) 40 MG capsule Take 1 capsule (40 mg total) by mouth daily before breakfast. Take 1 capsule by mouth  daily 02/24/21   Nafziger, Tommi Rumps, NP  famotidine (PEPCID) 40 MG tablet TAKE ONE TABLET BY MOUTH EVERY NIGHT AT BEDTIME 04/29/21   Esterwood, Amy S, PA-C  levothyroxine (SYNTHROID) 100 MCG tablet TAKE ONE TABLET BY MOUTH DAILY 05/19/21   Nafziger, Tommi Rumps, NP  MELATONIN PO Take 1 tablet by mouth daily.    [provider]  Multiple Vitamins-Minerals (ICAPS AREDS 2 PO) Take 1 capsule by mouth in the morning and at bedtime.    [provider]  omega-3 acid ethyl esters (LOVAZA) 1 g capsule Take 1 capsule (1 g total) by mouth 2 (two) times daily. 06/19/20 06/30/21  Nafziger, Tommi Rumps, NP  oxyCODONE-acetaminophen (PERCOCET) 5-325 MG tablet Take 0.5-1 tablets by mouth every 6 (six) hours as needed for severe pain (may cause constipation; do not take concurrently with tramadol or Fioricet). 04/24/21   Molpus, John, MD  Polyethylene Glycol 3350 (MIRALAX PO) Take by mouth. 1/2 capful every morning    [provider]  pregabalin (LYRICA) 150 MG capsule Take 1 capsule (150 mg total) by mouth daily. 08/06/21 11/04/21  Nafziger, Tommi Rumps, NP  simvastatin (ZOCOR) 20 MG tablet Take 1 tablet (20 mg total) by mouth at bedtime. 03/10/21   Nafziger, Tommi Rumps, NP  topiramate (TOPAMAX) 25 MG tablet TAKE ONE TABLET BY MOUTH DAILY 02/16/21   Nafziger, Tommi Rumps, NP  traMADol (ULTRAM) 50 MG tablet Take 50 mg by mouth every 6  (six) hours as needed.    [provider]  venlafaxine XR (EFFEXOR-XR) 37.5 MG 24 hr capsule TAKE ONE CAPSULE BY MOUTH DAILY WITH BREAKFAST 06/09/21   Nafziger, Tommi Rumps, NP     Positive ROS: Otherwise negative  All other systems have been reviewed and were otherwise negative with the exception of those mentioned in the HPI and as above.  Physical Exam: Constitutional: Alert, well-appearing, no acute distress Ears: External ears without lesions or tenderness. Ear canals with some wax adjacent to both TMs from use of Q-tips that it pushed the wax deep within the ear canal.  This was removed easily with suction.  Otherwise ear canals were clear bilaterally and the TMs were clear with good mobility on pneumatic otoscopy. Nasal: External nose without lesions. Septum with minimal deformity and mild rhinitis. Clear nasal passages Oral: Lips and gums without lesions. Tongue and palate mucosa without lesions. Posterior oropharynx clear.  Tonsil regions appear benign bilaterally. Neck: No palpable adenopathy or masses.  On palpation of the l TMJ joints she has fairly severe pain in the left TMJ joint on opening and closing her mouth. Respiratory: Breathing comfortably  Skin: No facial/neck lesions or rash noted.  Cerumen impaction removal  Date/Time: 08/26/2021 11:30 AM Performed by: Rozetta Nunnery, MD Authorized by: Rozetta Nunnery, MD   Consent:    Consent obtained:  Verbal   Consent given by:  Patient   Risks discussed:  Pain and bleeding Procedure details:  Location:  L ear and R ear   Procedure type: suction   Post-procedure details:    Inspection:  TM intact and canal normal   Hearing quality:  Improved   Procedure completion:  Tolerated well, no immediate complications Comments:     Small amount of wax adjacent to both TMs that was cleaned in the office using suction.  The ear canals and TMs were otherwise clear.  Assessment: Left ear pain secondary to TMJ  dysfunction Minimal wax buildup.  Plan: Discussed with the patient and her husband concerning the left ear pain is secondary to TMJ dysfunction and would recommend use of NSAIDs and a soft when the pain is bad.  Also suggested follow-up with her dentist. Cautioned her about not using Q-tips in her ears.Radene Journey, MD   CC:

## 2021-09-02 ENCOUNTER — Other Ambulatory Visit: Payer: Self-pay | Admitting: Adult Health

## 2021-09-02 DIAGNOSIS — Z760 Encounter for issue of repeat prescription: Secondary | ICD-10-CM

## 2021-09-03 ENCOUNTER — Encounter: Payer: Self-pay | Admitting: Adult Health

## 2021-09-03 ENCOUNTER — Ambulatory Visit (INDEPENDENT_AMBULATORY_CARE_PROVIDER_SITE_OTHER): Payer: Medicare HMO | Admitting: Adult Health

## 2021-09-03 ENCOUNTER — Other Ambulatory Visit: Payer: Self-pay

## 2021-09-03 VITALS — BP 110/60 | HR 74 | Temp 98.6°F | Ht 59.0 in | Wt 180.0 lb

## 2021-09-03 DIAGNOSIS — M545 Low back pain, unspecified: Secondary | ICD-10-CM

## 2021-09-03 DIAGNOSIS — G8929 Other chronic pain: Secondary | ICD-10-CM

## 2021-09-03 DIAGNOSIS — M542 Cervicalgia: Secondary | ICD-10-CM | POA: Diagnosis not present

## 2021-09-03 DIAGNOSIS — M26609 Unspecified temporomandibular joint disorder, unspecified side: Secondary | ICD-10-CM | POA: Diagnosis not present

## 2021-09-03 MED ORDER — CYCLOBENZAPRINE HCL 10 MG PO TABS: 10.0000 mg | ORAL_TABLET | Freq: Three times a day (TID) | ORAL | 0 refills | Status: DC | PRN

## 2021-09-03 MED ORDER — PREDNISONE 10 MG PO TABS: ORAL_TABLET | ORAL | 0 refills | Status: DC

## 2021-09-03 NOTE — Progress Notes (Signed)
Subjective:    [name], female    DOB: Sep 25, 1941, 80 y.o.   MRN: [id number]  HPI  80 year old female who  has a past medical history of Adenomatous colon polyp, Anxiety, Arthritis, Breast cancer ([place]) (08/08/13), Cataract, Chronic insomnia, Cluster headaches, Depression, Diverticulosis, Fatty liver (2011), Fibromyalgia, GERD (gastroesophageal reflux disease), H/O hiatal hernia, History of transfusion (08/30/2013), radiation therapy (10/29/13- 12/14/13), Hypothyroidism, Internal hemorrhoids, Irritable bowel syndrome, Kidney stone, Lymphedema, Macular degeneration, MDD (major depressive disorder), Osteopenia, Other abnormal glucose, Other and unspecified hyperlipidemia, Pain in joint, shoulder region, Pneumonia (4166,0630), Sleep apnea, and Stress incontinence, female.  She presents to the office today for chronic low back pain.  The past her low back pain has been on and off, over the last few weeks to a month back pain has been becoming worse.  Her pain is pretty constant but worse when she changes positions like from sitting to standing.  Her husband often has to help her up and when she starts walking she needs to hold onto something in the house, she is walking hunched over due to the pain.  At times she has had loss of bowel or bladder ( this has been going on for " awhile") No numbness or tingling in her lower extremities  Xray of Lumbar spine in 11/2020 showed   Spinal curvature. Degenerative anterolisthesis at L3-4 and L4-5. Lumbar region facet arthropathy, right worse than left. Findings appear the same when compared to the examination of September 2019.    Additionally she started to develop pain and a "popping sensation" in her neck over the last few weeks.  No numbness or tingling in her hands.  He has not found much that is been helpful for her low back or neck pain.  Does not want to take narcotic pain medication.  Additionally, she was also seen by ear nose and  throat recently for sided ear pain.  Did not feel as though ENT did much, she had a small amount of wax in each ear which was cleaned out and was diagnosed with TMJ.  Her dentist has also told her that she has TMJ.  She still has left-sided ear pain that is pretty constant.  Can be aggravated by chewing motions.  Review of Systems See HPI   Past Medical History:  Diagnosis Date   Adenomatous colon polyp    Anxiety    PHOBIAS   Arthritis    Breast cancer ([place]) 08/08/13   right LOQ   Cataract    Chronic insomnia    Cluster headaches    history of migraines / NONE FOR SEVERAL YRS   Depression    Diverticulosis    Fatty liver 2011   Fibromyalgia    GERD (gastroesophageal reflux disease)    H/O hiatal hernia    History of transfusion 08/30/2013   Hx of radiation therapy 10/29/13- 12/14/13   right chest wall 5040 cGy 28 sessions, right supraclavicular/axillary region 5040 cGy 28 sessions, right chest wall boost 1000 cGy 5 sessions   Hypothyroidism    Internal hemorrhoids    Irritable bowel syndrome    Kidney stone    Lymphedema    RT ARM - WEARS SLEEVE   Macular degeneration    hole/right eye   MDD (major depressive disorder)    Osteopenia    Other abnormal glucose    Other and unspecified hyperlipidemia    Pain in joint, shoulder region    Pneumonia 1601,0932  Sleep apnea    USES C-PAP   Stress incontinence, female     Social History   Socioeconomic History   Marital status: Married    Spouse name: Fritz Pickerel   Number of children: 3   Years of education: Not on file   Highest education level: Not on file  Occupational History   Occupation: retired bookkeeper  Tobacco Use   Smoking status: Former    Packs/day: 0.10    Years: 2.00    Pack years: 0.20    Types: Cigarettes    Start date: 11/16/1959    Quit date: 11/15/1960    Years since quitting: 60.8   Smokeless tobacco: Never  Vaping Use   Vaping Use: Never used  Substance and Sexual Activity   Alcohol use: Yes     Comment: occ   Drug use: No   Sexual activity: Not on file    Comment: menarche age 80, fist live birth 71, P 3, hysterectomy age 41, no HRT, BCP 2 yrs  Other Topics Concern   Not on file  Social History Narrative   Occupation:  Retired Radiation protection practitioner    Married with 3 grown children      Never Smoked     Alcohol use-no        Social Determinants of Radio broadcast assistant Strain: Low Risk    Difficulty of Paying Living Expenses: Not hard at all  Food Insecurity: No Food Insecurity   Worried About Charity fundraiser in the Last Year: Never true   Arboriculturist in the Last Year: Never true  Transportation Needs: No Transportation Needs   Lack of Transportation (Medical): No   Lack of Transportation (Non-Medical): No  Physical Activity: Insufficiently Active   Days of Exercise per Week: 3 days   Minutes of Exercise per Session: 30 min  Stress: Not on file  Social Connections: Socially Integrated   Frequency of Communication with Friends and Family: Three times a week   Frequency of Social Gatherings with Friends and Family: Three times a week   Attends Religious Services: More than 4 times per year   Active Member of Clubs or Organizations: Yes   Attends Music therapist: More than 4 times per year   Marital Status: Married  Human resources officer Violence: Not At Risk   Fear of Current or Ex-Partner: No   Emotionally Abused: No   Physically Abused: No   Sexually Abused: No    Past Surgical History:  Procedure Laterality Date   ABDOMINAL HYSTERECTOMY     APPENDECTOMY     BILATERAL TOTAL MASTECTOMY WITH AXILLARY LYMPH NODE DISSECTION  08/30/2013   Dr Barry Dienes   BREAST CYST ASPIRATION     9 cysts   CATARACT EXTRACTION, BILATERAL  2005/2007   CHOLECYSTECTOMY     COLONOSCOPY     CYSTOCELE REPAIR     EVACUATION BREAST HEMATOMA Left 08/31/2013   Procedure: EVACUATION HEMATOMA BREAST;  Surgeon: Stark Klein, MD;  Location: [facility];  Service: General;  Laterality:  Left;   EYE SURGERY     to repair macular hole   FOOT ARTHROPLASTY     lt    GANGLION CYST EXCISION     rt foot   HEMORRHOID SURGERY     03/1993   JOINT REPLACEMENT  03/15/11   left knee replacement   KNEE ARTHROSCOPY     /partial knee 2016/left knee 2012   MASS EXCISION  11/04/2011   Procedure: EXCISION  MASS;  Surgeon: Cammie Sickle., MD;  Location: [facility];  Service: Orthopedics;  Laterality: Right;  excisional biopsy right ulna mass   MASTECTOMY W/ SENTINEL NODE BIOPSY Right 08/30/2013   Procedure: RIGHT  AXILLARY SENTINEL LYMPH NODE BIOPSY; Right Axillary Node Disection;  Surgeon: Stark Klein, MD;  Location: [facility];  Service: General;  Laterality: Right;  Right side nuc med 7:00    PARTIAL KNEE ARTHROPLASTY Right 11/03/2015   Procedure: RIGHT KNEE MEDIAL UNICOMPARTMENTAL ARTHROPLASTY ;  Surgeon: Gaynelle Arabian, MD;  Location: [facility];  Service: Orthopedics;  Laterality: Right;   RECTOCELE REPAIR     SIMPLE MASTECTOMY WITH AXILLARY SENTINEL NODE BIOPSY Left 08/30/2013   Procedure: Bilateral Breast Mastectomy ;  Surgeon: Stark Klein, MD;  Location: [facility];  Service: General;  Laterality: Left;   skin tags removed     breast, panty line, neckline   TOE SURGERY     preventative crossover toe surg/right foot   TOE SURGERY  2009   left foot/screw  in 2nd toe   TONSILLECTOMY     UPPER GASTROINTESTINAL ENDOSCOPY      Family History  Problem Relation Age of Onset   Stroke Mother        died age 101   Diabetes Mother    Breast cancer Mother 33   Breast cancer Sister 54   Breast cancer Paternal Aunt 26   Diabetes Maternal Grandfather    Breast cancer Paternal Grandmother 67   Breast cancer Paternal Aunt        dx in her 96s   Cancer Maternal Grandmother        intra-abdominal cancer   Brain cancer Maternal Uncle 8   Brain cancer Cousin 54       maternal cousin   Brain cancer Cousin 64       paternal cousin   Colon cancer Neg Hx     No Known  Allergies  Current Outpatient Medications on File Prior to Visit  Medication Sig Dispense Refill   Bacillus Coagulans-Inulin (PROBIOTIC) 1-250 BILLION-MG CAPS Take by mouth.     butalbital-acetaminophen-caffeine (FIORICET) 50-325-40 MG tablet Take 1 tablet by mouth as needed for headache. 30 tablet 1   Calcium Carbonate-Vitamin D (CALCIUM-VITAMIN D3 PO) Take 1 tablet by mouth in the morning and at bedtime.     Cholecalciferol (VITAMIN D3) 50 MCG (2000 UT) TABS Take 2 tablets by mouth daily.     diazepam (VALIUM) 5 MG tablet Take 5 mg by mouth every 6 (six) hours as needed for anxiety. As needed (flying & dental)     esomeprazole (NEXIUM) 40 MG capsule Take 1 capsule (40 mg total) by mouth daily before breakfast. Take 1 capsule by mouth  daily 365 capsule 0   famotidine (PEPCID) 40 MG tablet TAKE ONE TABLET BY MOUTH EVERY NIGHT AT BEDTIME 90 tablet 1   levothyroxine (SYNTHROID) 100 MCG tablet TAKE ONE TABLET BY MOUTH DAILY 365 tablet 0   MELATONIN PO Take 1 tablet by mouth daily.     Multiple Vitamins-Minerals (ICAPS AREDS 2 PO) Take 1 capsule by mouth in the morning and at bedtime.     omega-3 acid ethyl esters (LOVAZA) 1 g capsule TAKE ONE CAPSULE BY MOUTH TWICE A DAY 180 capsule 3   oxyCODONE-acetaminophen (PERCOCET) 5-325 MG tablet Take 0.5-1 tablets by mouth every 6 (six) hours as needed for severe pain (may cause constipation; do not take concurrently with tramadol or Fioricet). 10 tablet 0  Polyethylene Glycol 3350 (MIRALAX PO) Take by mouth. 1/2 capful every morning     pregabalin (LYRICA) 150 MG capsule Take 1 capsule (150 mg total) by mouth daily. 90 capsule 0   simvastatin (ZOCOR) 20 MG tablet Take 1 tablet (20 mg total) by mouth at bedtime. 360 tablet 0   topiramate (TOPAMAX) 25 MG tablet TAKE ONE TABLET BY MOUTH DAILY 325 tablet 0   traMADol (ULTRAM) 50 MG tablet Take 50 mg by mouth every 6 (six) hours as needed.     venlafaxine XR (EFFEXOR-XR) 37.5 MG 24 hr capsule TAKE ONE CAPSULE  BY MOUTH DAILY WITH BREAKFAST 90 capsule 1   No current facility-administered medications on file prior to visit.    BP 110/60   Pulse 74   Temp 98.6 F (37 C) (Oral)   Ht 4\' 11"  (1.499 m)   Wt 180 lb (81.6 kg)   SpO2 96%   BMI 36.36 kg/m       Objective:   Physical Exam Vitals and nursing note reviewed.  Constitutional:      Appearance: Normal appearance.  HENT:     Head:     Jaw: Tenderness and pain on movement present. No trismus, swelling or malocclusion.     Right Ear: Tympanic membrane, ear canal and external ear normal. There is no impacted cerumen.     Left Ear: Tympanic membrane, ear canal and external ear normal. There is no impacted cerumen.  Cardiovascular:     Rate and Rhythm: Normal rate and regular rhythm.     Pulses: Normal pulses.     Heart sounds: Normal heart sounds.  Pulmonary:     Effort: Pulmonary effort is normal.     Breath sounds: Normal breath sounds.  Musculoskeletal:        General: Tenderness present.     Cervical back: Normal.     Lumbar back: Spasms, tenderness and bony tenderness present. Decreased range of motion.     Comments: She is in obvious pain during today's exam.  Tearful at times.  Skin:    General: Skin is warm and dry.  Neurological:     General: No focal deficit present.     Mental Status: She is alert and oriented to person, place, and time.  Psychiatric:        Mood and Affect: Mood normal.        Behavior: Behavior normal.        Thought Content: Thought content normal.        Judgment: Judgment normal.      Assessment & Plan:   1. Chronic bilateral low back pain without sciatica  - cyclobenzaprine (FLEXERIL) 10 MG tablet; Take 1 tablet (10 mg total) by mouth 3 (three) times daily as needed for muscle spasms.  Dispense: 30 tablet; Refill: 0 - predniSONE (DELTASONE) 10 MG tablet; 40 mg x 3 days, 20 mg x 3 days, 10 mg x 3 days  Dispense: 21 tablet; Refill: 0 - MR Lumbar Spine Wo Contrast; Future  2. Cervical  pain  - cyclobenzaprine (FLEXERIL) 10 MG tablet; Take 1 tablet (10 mg total) by mouth 3 (three) times daily as needed for muscle spasms.  Dispense: 30 tablet; Refill: 0 - predniSONE (DELTASONE) 10 MG tablet; 40 mg x 3 days, 20 mg x 3 days, 10 mg x 3 days  Dispense: 21 tablet; Refill: 0 - MR Cervical Spine Wo Contrast; Future  3. TMJ (temporomandibular joint syndrome)  - cyclobenzaprine (FLEXERIL) 10 MG tablet; Take  1 tablet (10 mg total) by mouth 3 (three) times daily as needed for muscle spasms.  Dispense: 30 tablet; Refill: 0 - predniSONE (DELTASONE) 10 MG tablet; 40 mg x 3 days, 20 mg x 3 days, 10 mg x 3 days  Dispense: 21 tablet; Refill: 0

## 2021-09-08 ENCOUNTER — Encounter: Payer: Self-pay | Admitting: Adult Health

## 2021-09-15 ENCOUNTER — Encounter: Payer: Self-pay | Admitting: Adult Health

## 2021-09-17 ENCOUNTER — Encounter: Payer: Self-pay | Admitting: Adult Health

## 2021-09-17 ENCOUNTER — Other Ambulatory Visit: Payer: Self-pay | Admitting: Adult Health

## 2021-09-17 ENCOUNTER — Telehealth (INDEPENDENT_AMBULATORY_CARE_PROVIDER_SITE_OTHER): Payer: Medicare HMO | Admitting: Adult Health

## 2021-09-17 ENCOUNTER — Telehealth: Payer: Self-pay

## 2021-09-17 VITALS — HR 85 | Ht 59.0 in | Wt 180.0 lb

## 2021-09-17 DIAGNOSIS — U071 COVID-19: Secondary | ICD-10-CM | POA: Diagnosis not present

## 2021-09-17 MED ORDER — MOLNUPIRAVIR EUA 200MG CAPSULE: 4.0000 | ORAL_CAPSULE | Freq: Two times a day (BID) | ORAL | 0 refills | Status: AC

## 2021-09-17 MED ORDER — MOLNUPIRAVIR EUA 200MG CAPSULE: 4.0000 | ORAL_CAPSULE | Freq: Two times a day (BID) | ORAL | 0 refills | Status: DC

## 2021-09-17 MED ORDER — HYDROCODONE BIT-HOMATROP MBR 5-1.5 MG/5ML PO SOLN: 5.0000 mL | Freq: Three times a day (TID) | ORAL | 0 refills | Status: DC | PRN

## 2021-09-17 NOTE — Telephone Encounter (Signed)
Please advise 

## 2021-09-17 NOTE — Telephone Encounter (Signed)
Noted  

## 2021-09-17 NOTE — Telephone Encounter (Signed)
[facility] called stating Rx is not in stock there molnupiravir EUA (LAGEVRIO) 200 mg CAPS capsule

## 2021-09-17 NOTE — Progress Notes (Signed)
Virtual Visit via Video Note  I connected with Jean Nash on 09/17/21 at  3:00 PM EDT by a video enabled telemedicine application and verified that I am speaking with the correct person using two identifiers.  Location patient: home Location provider:work or home office Persons participating in the virtual visit: patient, provider  I discussed the limitations of evaluation and management by telemedicine and the availability of in person appointments. The patient expressed understanding and agreed to proceed.   HPI: 80 year old female who is being evaluated today for COVID-19 infection.  Symptoms started 3 to 4 days ago, have improved slightly.  Continues to have a constant dry hacking cough, nasal congestion, and extreme fatigue.  She tested positive yesterday.  Denies fevers, chills, nausea, vomiting, or diarrhea.  Associated symptoms include weakness and feeling unsteady.  He has been staying hydrated at home.   ROS: See pertinent positives and negatives per HPI.  Past Medical History:  Diagnosis Date   Adenomatous colon polyp    Anxiety    PHOBIAS   Arthritis    Breast cancer ([facility] [facility]) 08/08/13   right LOQ   Cataract    Chronic insomnia    Cluster headaches    history of migraines / NONE FOR SEVERAL YRS   Depression    Diverticulosis    Fatty liver 2011   Fibromyalgia    GERD (gastroesophageal reflux disease)    H/O hiatal hernia    History of transfusion 08/30/2013   Hx of radiation therapy 10/29/13- 12/14/13   right chest wall 5040 cGy 28 sessions, right supraclavicular/axillary region 5040 cGy 28 sessions, right chest wall boost 1000 cGy 5 sessions   Hypothyroidism    Internal hemorrhoids    Irritable bowel syndrome    Kidney stone    Lymphedema    RT ARM - WEARS SLEEVE   Macular degeneration    hole/right eye   MDD (major depressive disorder)    Osteopenia    Other abnormal glucose    Other and unspecified hyperlipidemia    Pain in joint, shoulder region     Pneumonia 5427,0623   Sleep apnea    USES C-PAP   Stress incontinence, female     Past Surgical History:  Procedure Laterality Date   ABDOMINAL HYSTERECTOMY     APPENDECTOMY     BILATERAL TOTAL MASTECTOMY WITH AXILLARY LYMPH NODE DISSECTION  08/30/2013   Dr Barry Dienes   BREAST CYST ASPIRATION     9 cysts   CATARACT EXTRACTION, BILATERAL  2005/2007   CHOLECYSTECTOMY     COLONOSCOPY     CYSTOCELE REPAIR     EVACUATION BREAST HEMATOMA Left 08/31/2013   Procedure: EVACUATION HEMATOMA BREAST;  Surgeon: Stark Klein, MD;  Location: [facility];  Service: General;  Laterality: Left;   EYE SURGERY     to repair macular hole   FOOT ARTHROPLASTY     lt    GANGLION CYST EXCISION     rt foot   HEMORRHOID SURGERY     03/1993   JOINT REPLACEMENT  03/15/11   left knee replacement   KNEE ARTHROSCOPY     /partial knee 2016/left knee 2012   MASS EXCISION  11/04/2011   Procedure: EXCISION MASS;  Surgeon: Cammie Sickle., MD;  Location: [facility];  Service: Orthopedics;  Laterality: Right;  excisional biopsy right ulna mass   MASTECTOMY W/ SENTINEL NODE BIOPSY Right 08/30/2013   Procedure: RIGHT  AXILLARY SENTINEL LYMPH NODE BIOPSY; Right Axillary Node Disection;  Surgeon: Stark Klein, MD;  Location: [facility];  Service: General;  Laterality: Right;  Right side nuc med 7:00    PARTIAL KNEE ARTHROPLASTY Right 11/03/2015   Procedure: RIGHT KNEE MEDIAL UNICOMPARTMENTAL ARTHROPLASTY ;  Surgeon: Gaynelle Arabian, MD;  Location: [facility];  Service: Orthopedics;  Laterality: Right;   RECTOCELE REPAIR     SIMPLE MASTECTOMY WITH AXILLARY SENTINEL NODE BIOPSY Left 08/30/2013   Procedure: Bilateral Breast Mastectomy ;  Surgeon: Stark Klein, MD;  Location: [facility];  Service: General;  Laterality: Left;   skin tags removed     breast, panty line, neckline   TOE SURGERY     preventative crossover toe surg/right foot   TOE SURGERY  2009   left foot/screw  in 2nd toe   TONSILLECTOMY     UPPER  GASTROINTESTINAL ENDOSCOPY      Family History  Problem Relation Age of Onset   Stroke Mother        died age 43   Diabetes Mother    Breast cancer Mother 54   Breast cancer Sister 76   Breast cancer Paternal Aunt 20   Diabetes Maternal Grandfather    Breast cancer Paternal Grandmother 22   Breast cancer Paternal Aunt        dx in her 58s   Cancer Maternal Grandmother        intra-abdominal cancer   Brain cancer Maternal Uncle 24   Brain cancer Cousin 26       maternal cousin   Brain cancer Cousin 32       paternal cousin   Colon cancer Neg Hx        Current Outpatient Medications:    Bacillus Coagulans-Inulin (PROBIOTIC) 1-250 BILLION-MG CAPS, Take by mouth., Disp: , Rfl:    butalbital-acetaminophen-caffeine (FIORICET) 50-325-40 MG tablet, Take 1 tablet by mouth as needed for headache., Disp: 30 tablet, Rfl: 1   Calcium Carbonate-Vitamin D (CALCIUM-VITAMIN D3 PO), Take 1 tablet by mouth in the morning and at bedtime., Disp: , Rfl:    Cholecalciferol (VITAMIN D3) 50 MCG (2000 UT) TABS, Take 2 tablets by mouth daily., Disp: , Rfl:    cyclobenzaprine (FLEXERIL) 10 MG tablet, Take 1 tablet (10 mg total) by mouth 3 (three) times daily as needed for muscle spasms., Disp: 30 tablet, Rfl: 0   diazepam (VALIUM) 5 MG tablet, Take 5 mg by mouth every 6 (six) hours as needed for anxiety. As needed (flying & dental), Disp: , Rfl:    esomeprazole (NEXIUM) 40 MG capsule, Take 1 capsule (40 mg total) by mouth daily before breakfast. Take 1 capsule by mouth  daily, Disp: 365 capsule, Rfl: 0   famotidine (PEPCID) 40 MG tablet, TAKE ONE TABLET BY MOUTH EVERY NIGHT AT BEDTIME, Disp: 90 tablet, Rfl: 1   HYDROcodone bit-homatropine (HYCODAN) 5-1.5 MG/5ML syrup, Take 5 mLs by mouth every 8 (eight) hours as needed for cough., Disp: 120 mL, Rfl: 0   levothyroxine (SYNTHROID) 100 MCG tablet, TAKE ONE TABLET BY MOUTH DAILY, Disp: 365 tablet, Rfl: 0   MELATONIN PO, Take 1 tablet by mouth daily., Disp: ,  Rfl:    molnupiravir EUA (LAGEVRIO) 200 mg CAPS capsule, Take 4 capsules (800 mg total) by mouth 2 (two) times daily for 5 days., Disp: 40 capsule, Rfl: 0   Multiple Vitamins-Minerals (ICAPS AREDS 2 PO), Take 1 capsule by mouth in the morning and at bedtime., Disp: , Rfl:    omega-3 acid ethyl esters (LOVAZA) 1 g capsule, TAKE ONE  CAPSULE BY MOUTH TWICE A DAY, Disp: 180 capsule, Rfl: 3   oxyCODONE-acetaminophen (PERCOCET) 5-325 MG tablet, Take 0.5-1 tablets by mouth every 6 (six) hours as needed for severe pain (may cause constipation; do not take concurrently with tramadol or Fioricet)., Disp: 10 tablet, Rfl: 0   Polyethylene Glycol 3350 (MIRALAX PO), Take by mouth. 1/2 capful every morning, Disp: , Rfl:    predniSONE (DELTASONE) 10 MG tablet, 40 mg x 3 days, 20 mg x 3 days, 10 mg x 3 days, Disp: 21 tablet, Rfl: 0   pregabalin (LYRICA) 150 MG capsule, Take 1 capsule (150 mg total) by mouth daily., Disp: 90 capsule, Rfl: 0   simvastatin (ZOCOR) 20 MG tablet, Take 1 tablet (20 mg total) by mouth at bedtime., Disp: 360 tablet, Rfl: 0   topiramate (TOPAMAX) 25 MG tablet, TAKE ONE TABLET BY MOUTH DAILY, Disp: 325 tablet, Rfl: 0   traMADol (ULTRAM) 50 MG tablet, Take 50 mg by mouth every 6 (six) hours as needed., Disp: , Rfl:    venlafaxine XR (EFFEXOR-XR) 37.5 MG 24 hr capsule, TAKE ONE CAPSULE BY MOUTH DAILY WITH BREAKFAST, Disp: 90 capsule, Rfl: 1  EXAM:  VITALS per patient if applicable:  GENERAL: alert, oriented, appears well but tired and in no acute distress  HEENT: atraumatic, conjunttiva clear, no obvious abnormalities on inspection of external nose and ears  NECK: normal movements of the head and neck  LUNGS: on inspection no signs of respiratory distress, breathing rate appears normal, no obvious gross SOB, gasping or wheezing. Constant dry cough.   CV: no obvious cyanosis  MS: moves all visible extremities without noticeable abnormality  PSYCH/NEURO: pleasant and cooperative, no  obvious depression or anxiety, speech and thought processing grossly intact  ASSESSMENT AND PLAN:  Discussed the following assessment and plan:  1. COVID-19 virus infection -We will treat with molnupiravir due to symptoms, age, and chronic disease.  Send in Hycodan cough syrup to help with coughing that is interrupting her sleep. -Return precautions reviewed - molnupiravir EUA (LAGEVRIO) 200 mg CAPS capsule; Take 4 capsules (800 mg total) by mouth 2 (two) times daily for 5 days.  Dispense: 40 capsule; Refill: 0 - HYDROcodone bit-homatropine (HYCODAN) 5-1.5 MG/5ML syrup; Take 5 mLs by mouth every 8 (eight) hours as needed for cough.  Dispense: 120 mL; Refill: 0     I discussed the assessment and treatment plan with the patient. The patient was provided an opportunity to ask questions and all were answered. The patient agreed with the plan and demonstrated an understanding of the instructions.   The patient was advised to call back or seek an in-person evaluation if the symptoms worsen or if the condition fails to improve as anticipated.   [name], NP

## 2021-09-18 NOTE — Telephone Encounter (Signed)
Pt stated that they finally got the Rx nothing further needed!

## 2021-09-20 ENCOUNTER — Other Ambulatory Visit: Payer: Medicare HMO

## 2021-10-22 ENCOUNTER — Other Ambulatory Visit: Payer: Self-pay | Admitting: Physician Assistant

## 2021-11-13 ENCOUNTER — Encounter: Payer: Self-pay | Admitting: Adult Health

## 2021-11-13 NOTE — Telephone Encounter (Signed)
Spoke to pt spouse and he was advised of [facility]. I advised that he call to reschedule the appt. For CT and MRI. Information was also given for pt to call [facility] to schedule a f/u appt. Pt will bring vaccination card to appt. To update the chart. No further action is needed at this time.

## 2021-11-17 ENCOUNTER — Ambulatory Visit (INDEPENDENT_AMBULATORY_CARE_PROVIDER_SITE_OTHER): Payer: Medicare HMO | Admitting: Adult Health

## 2021-11-17 ENCOUNTER — Encounter: Payer: Self-pay | Admitting: Adult Health

## 2021-11-17 VITALS — BP 120/82 | HR 77 | Temp 98.7°F | Ht 59.0 in | Wt 176.0 lb

## 2021-11-17 DIAGNOSIS — M545 Low back pain, unspecified: Secondary | ICD-10-CM | POA: Diagnosis not present

## 2021-11-17 DIAGNOSIS — M542 Cervicalgia: Secondary | ICD-10-CM | POA: Diagnosis not present

## 2021-11-17 DIAGNOSIS — G8929 Other chronic pain: Secondary | ICD-10-CM

## 2021-11-17 NOTE — Progress Notes (Signed)
Subjective:    [name], female    DOB: Oct 08, 1941, 81 y.o.   MRN: [id number]  HPI 81 year old female who  has a past medical history of Adenomatous colon polyp, Anxiety, Arthritis, Breast cancer ([place]) (08/08/13), Cataract, Chronic insomnia, Cluster headaches, Depression, Diverticulosis, Fatty liver (2011), Fibromyalgia, GERD (gastroesophageal reflux disease), H/O hiatal hernia, History of transfusion (08/30/2013), radiation therapy (10/29/13- 12/14/13), Hypothyroidism, Internal hemorrhoids, Irritable bowel syndrome, Kidney stone, Lymphedema, Macular degeneration, MDD (major depressive disorder), Osteopenia, Other abnormal glucose, Other and unspecified hyperlipidemia, Pain in joint, shoulder region, Pneumonia (3154,0086), Sleep apnea, and Stress incontinence, female.  She presents to the office today for continued chronic back pain in her neck and lower lumbar area.  Pain is stayed pretty constant.  Is worse when changing positions.  She feels hunched over due to the pain.  She was last seen for this in October 2022 and had MRIs of the lumbar and cervical spine ordered, unfortunately on the day that she was supposed to have the imaging done she was diagnosed with COVID-19.  Did reschedule for next week.  She is not able to exercise much due to the chronic back pain and due to this is feeling fatigued and weak.   Review of Systems See HPI   Past Medical History:  Diagnosis Date   Adenomatous colon polyp    Anxiety    PHOBIAS   Arthritis    Breast cancer ([place]) 08/08/13   right LOQ   Cataract    Chronic insomnia    Cluster headaches    history of migraines / NONE FOR SEVERAL YRS   Depression    Diverticulosis    Fatty liver 2011   Fibromyalgia    GERD (gastroesophageal reflux disease)    H/O hiatal hernia    History of transfusion 08/30/2013   Hx of radiation therapy 10/29/13- 12/14/13   right chest wall 5040 cGy 28 sessions, right supraclavicular/axillary region 5040  cGy 28 sessions, right chest wall boost 1000 cGy 5 sessions   Hypothyroidism    Internal hemorrhoids    Irritable bowel syndrome    Kidney stone    Lymphedema    RT ARM - WEARS SLEEVE   Macular degeneration    hole/right eye   MDD (major depressive disorder)    Osteopenia    Other abnormal glucose    Other and unspecified hyperlipidemia    Pain in joint, shoulder region    Pneumonia 7619,5093   Sleep apnea    USES C-PAP   Stress incontinence, female     Social History   Socioeconomic History   Marital status: Married    Spouse name: Fritz Pickerel   Number of children: 3   Years of education: Not on file   Highest education level: Not on file  Occupational History   Occupation: retired bookkeeper  Tobacco Use   Smoking status: Former    Packs/day: 0.10    Years: 2.00    Pack years: 0.20    Types: Cigarettes    Start date: 11/16/1959    Quit date: 11/15/1960    Years since quitting: 61.0   Smokeless tobacco: Never  Vaping Use   Vaping Use: Never used  Substance and Sexual Activity   Alcohol use: Yes    Comment: occ   Drug use: No   Sexual activity: Not on file    Comment: menarche age 63, fist live birth 63, P 3, hysterectomy age 60, no HRT, BCP 2 yrs  Other Topics Concern   Not on file  Social History Narrative   Occupation:  Retired Radiation protection practitioner    Married with 3 grown children      Never Smoked     Alcohol use-no        Social Determinants of Radio broadcast assistant Strain: Low Risk    Difficulty of Paying Living Expenses: Not hard at all  Food Insecurity: No Food Insecurity   Worried About Charity fundraiser in the Last Year: Never true   Arboriculturist in the Last Year: Never true  Transportation Needs: No Transportation Needs   Lack of Transportation (Medical): No   Lack of Transportation (Non-Medical): No  Physical Activity: Inactive   Days of Exercise per Week: 0 days   Minutes of Exercise per Session: 30 min  Stress: Stress Concern Present    Feeling of Stress : To some extent  Social Connections: Socially Integrated   Frequency of Communication with Friends and Family: More than three times a week   Frequency of Social Gatherings with Friends and Family: Twice a week   Attends Religious Services: More than 4 times per year   Active Member of Clubs or Organizations: Yes   Attends Music therapist: More than 4 times per year   Marital Status: Married  Human resources officer Violence: Not At Risk   Fear of Current or Ex-Partner: No   Emotionally Abused: No   Physically Abused: No   Sexually Abused: No    Past Surgical History:  Procedure Laterality Date   ABDOMINAL HYSTERECTOMY     APPENDECTOMY     BILATERAL TOTAL MASTECTOMY WITH AXILLARY LYMPH NODE DISSECTION  08/30/2013   Dr Barry Dienes   BREAST CYST ASPIRATION     9 cysts   CATARACT EXTRACTION, BILATERAL  2005/2007   CHOLECYSTECTOMY     COLONOSCOPY     CYSTOCELE REPAIR     EVACUATION BREAST HEMATOMA Left 08/31/2013   Procedure: EVACUATION HEMATOMA BREAST;  Surgeon: Stark Klein, MD;  Location: [facility];  Service: General;  Laterality: Left;   EYE SURGERY     to repair macular hole   FOOT ARTHROPLASTY     lt    GANGLION CYST EXCISION     rt foot   HEMORRHOID SURGERY     03/1993   JOINT REPLACEMENT  03/15/11   left knee replacement   KNEE ARTHROSCOPY     /partial knee 2016/left knee 2012   MASS EXCISION  11/04/2011   Procedure: EXCISION MASS;  Surgeon: Cammie Sickle., MD;  Location: [facility];  Service: Orthopedics;  Laterality: Right;  excisional biopsy right ulna mass   MASTECTOMY W/ SENTINEL NODE BIOPSY Right 08/30/2013   Procedure: RIGHT  AXILLARY SENTINEL LYMPH NODE BIOPSY; Right Axillary Node Disection;  Surgeon: Stark Klein, MD;  Location: [facility];  Service: General;  Laterality: Right;  Right side nuc med 7:00    PARTIAL KNEE ARTHROPLASTY Right 11/03/2015   Procedure: RIGHT KNEE MEDIAL UNICOMPARTMENTAL ARTHROPLASTY ;  Surgeon: Gaynelle Arabian, MD;  Location: [facility];  Service: Orthopedics;  Laterality: Right;   RECTOCELE REPAIR     SIMPLE MASTECTOMY WITH AXILLARY SENTINEL NODE BIOPSY Left 08/30/2013   Procedure: Bilateral Breast Mastectomy ;  Surgeon: Stark Klein, MD;  Location: [facility];  Service: General;  Laterality: Left;   skin tags removed     breast, panty line, neckline   TOE SURGERY     preventative crossover toe  surg/right foot   TOE SURGERY  2009   left foot/screw  in 2nd toe   TONSILLECTOMY     UPPER GASTROINTESTINAL ENDOSCOPY      Family History  Problem Relation Age of Onset   Stroke Mother        died age 29   Diabetes Mother    Breast cancer Mother 75   Breast cancer Sister 38   Breast cancer Paternal Aunt 67   Diabetes Maternal Grandfather    Breast cancer Paternal Grandmother 69   Breast cancer Paternal Aunt        dx in her 87s   Cancer Maternal Grandmother        intra-abdominal cancer   Brain cancer Maternal Uncle 8   Brain cancer Cousin 43       maternal cousin   Brain cancer Cousin 23       paternal cousin   Colon cancer Neg Hx     No Known Allergies  Current Outpatient Medications on File Prior to Visit  Medication Sig Dispense Refill   Bacillus Coagulans-Inulin (PROBIOTIC) 1-250 BILLION-MG CAPS Take by mouth.     butalbital-acetaminophen-caffeine (FIORICET) 50-325-40 MG tablet Take 1 tablet by mouth as needed for headache. 30 tablet 1   Calcium Carbonate-Vitamin D (CALCIUM-VITAMIN D3 PO) Take 1 tablet by mouth in the morning and at bedtime.     Cholecalciferol (VITAMIN D3) 50 MCG (2000 UT) TABS Take 2 tablets by mouth daily.     cyclobenzaprine (FLEXERIL) 10 MG tablet Take 1 tablet (10 mg total) by mouth 3 (three) times daily as needed for muscle spasms. 30 tablet 0   diazepam (VALIUM) 5 MG tablet Take 5 mg by mouth every 6 (six) hours as needed for anxiety. As needed (flying & dental)     esomeprazole (NEXIUM) 40 MG capsule Take 1 capsule (40 mg total) by mouth daily before  breakfast. Take 1 capsule by mouth  daily 365 capsule 0   famotidine (PEPCID) 40 MG tablet TAKE ONE TABLET BY MOUTH EVERY NIGHT AT BEDTIME 90 tablet 1   HYDROcodone bit-homatropine (HYCODAN) 5-1.5 MG/5ML syrup Take 5 mLs by mouth every 8 (eight) hours as needed for cough. 120 mL 0   levothyroxine (SYNTHROID) 100 MCG tablet TAKE ONE TABLET BY MOUTH DAILY 365 tablet 0   MELATONIN PO Take 1 tablet by mouth daily.     Multiple Vitamins-Minerals (ICAPS AREDS 2 PO) Take 1 capsule by mouth in the morning and at bedtime.     omega-3 acid ethyl esters (LOVAZA) 1 g capsule TAKE ONE CAPSULE BY MOUTH TWICE A DAY 180 capsule 3   oxyCODONE-acetaminophen (PERCOCET) 5-325 MG tablet Take 0.5-1 tablets by mouth every 6 (six) hours as needed for severe pain (may cause constipation; do not take concurrently with tramadol or Fioricet). 10 tablet 0   Polyethylene Glycol 3350 (MIRALAX PO) Take by mouth. 1/2 capful every morning     predniSONE (DELTASONE) 10 MG tablet 40 mg x 3 days, 20 mg x 3 days, 10 mg x 3 days 21 tablet 0   simvastatin (ZOCOR) 20 MG tablet Take 1 tablet (20 mg total) by mouth at bedtime. 360 tablet 0   topiramate (TOPAMAX) 25 MG tablet TAKE ONE TABLET BY MOUTH DAILY 325 tablet 0   traMADol (ULTRAM) 50 MG tablet Take 50 mg by mouth every 6 (six) hours as needed.     venlafaxine XR (EFFEXOR-XR) 37.5 MG 24 hr capsule TAKE ONE CAPSULE BY MOUTH DAILY WITH BREAKFAST  90 capsule 1   pregabalin (LYRICA) 150 MG capsule Take 1 capsule (150 mg total) by mouth daily. 90 capsule 0   No current facility-administered medications on file prior to visit.    BP 120/82    Pulse 77    Temp 98.7 F (37.1 C) (Oral)    Ht 4\' 11"  (1.499 m)    Wt 176 lb (79.8 kg)    SpO2 95%    BMI 35.55 kg/m       Objective:   Physical Exam Vitals and nursing note reviewed.  Constitutional:      Appearance: Normal appearance.  Cardiovascular:     Rate and Rhythm: Normal rate and regular rhythm.     Pulses: Normal pulses.      Heart sounds: Normal heart sounds.  Pulmonary:     Effort: Pulmonary effort is normal.     Breath sounds: Normal breath sounds.  Musculoskeletal:        General: Normal range of motion.     Comments: She does walk with a hunched over gait.  Is in pain with change in position from a seated to a standing position.  Skin:    General: Skin is warm and dry.  Neurological:     General: No focal deficit present.     Mental Status: She is alert and oriented to person, place, and time.  Psychiatric:        Mood and Affect: Mood normal.        Behavior: Behavior normal.        Thought Content: Thought content normal.        Judgment: Judgment normal.      Assessment & Plan:  1. Chronic bilateral low back pain without sciatica -Patient and myself are in agreement to hold off on any treatment until we find out results of the MRI.  We will follow-up with her once the imaging results have been received.  Consider referral to neurosurgery or orthopedics.  2. Chronic cervical pain   [name], NP

## 2021-11-24 ENCOUNTER — Encounter: Payer: Self-pay | Admitting: Adult Health

## 2021-11-24 NOTE — Telephone Encounter (Signed)
Please advise  Haven't seen any paperwork for this have you?

## 2021-11-25 ENCOUNTER — Other Ambulatory Visit: Payer: Medicare HMO

## 2021-11-26 ENCOUNTER — Telehealth: Payer: Self-pay | Admitting: Pharmacist

## 2021-11-26 ENCOUNTER — Telehealth: Payer: Self-pay | Admitting: Adult Health

## 2021-11-26 NOTE — Chronic Care Management (AMB) (Signed)
Chronic Care Management Pharmacy Assistant   Name: Jean Nash  MRN: [id number] DOB: 08-Jan-1941  Reason for Encounter: Disease State General Assessment    Recent office visits:  11/24/21 [name], NP - Patient presented for Chronic bilateral low back pain without sciatica and other concerns. No medication changes.  09/17/21 [name], NP - Patient presented via tele - visit for COVID 19 infection. Prescribed Hydrocodone 5-1.5 mg, Prescribed  Molnuliravir 800 mg.   09/03/21 [name], NP - Patient presented for Chronic bilateral low back pain without sciatica and other concerns. Prescribed Cyclobenzaprine HCL and Prednisone 10 mg  08/06/21 [name], NP - Patient presented for right sided chest wall pain and other concerns. Prescribed Pregablin 150 mg.  07/31/21 [name], NP - Patient presented for right flank pain and other concerns. No medication changes.  07/22/21 Randel Pigg, LPN - Patient presented for Medicare Annual Wellness Exam. No medication changes.  07/06/21 Burchette, Alinda Sierras, MD - Patient presented for Allergic rhinitis due to pollen and other concerns. Stopped Sucralfate.  Recent consult visits:  08/26/21 Rozetta Nunnery, MD (Otolaryngology) - Patient presented for Bilateral impacted cerumen and other concerns. No medication changes.  07/16/21 - Patient presented for Mobile DEXA at [facility]  06/30/21 Bo Merino, MD (Rheumatology) - Patient presented for Fibromyalgia and other concerns. No medication changes.  06/26/21 Willia Craze, NP (Gastroenterology) - Patient presented for [facility] stools. No medication changes.  Hospital visits:  None in previous 6 months  Medications: Outpatient Encounter Medications as of 11/26/2021  Medication Sig Note   Bacillus Coagulans-Inulin (PROBIOTIC) 1-250 BILLION-MG CAPS Take by mouth.    butalbital-acetaminophen-caffeine (FIORICET) 50-325-40 MG tablet Take 1 tablet by mouth as needed  for headache.    Calcium Carbonate-Vitamin D (CALCIUM-VITAMIN D3 PO) Take 1 tablet by mouth in the morning and at bedtime. 05/16/2020: 200 units/ 15mcg tablet   Cholecalciferol (VITAMIN D3) 50 MCG (2000 UT) TABS Take 2 tablets by mouth daily.    cyclobenzaprine (FLEXERIL) 10 MG tablet Take 1 tablet (10 mg total) by mouth 3 (three) times daily as needed for muscle spasms.    diazepam (VALIUM) 5 MG tablet Take 5 mg by mouth every 6 (six) hours as needed for anxiety. As needed (flying & dental)    esomeprazole (NEXIUM) 40 MG capsule Take 1 capsule (40 mg total) by mouth daily before breakfast. Take 1 capsule by mouth  daily    famotidine (PEPCID) 40 MG tablet TAKE ONE TABLET BY MOUTH EVERY NIGHT AT BEDTIME    HYDROcodone bit-homatropine (HYCODAN) 5-1.5 MG/5ML syrup Take 5 mLs by mouth every 8 (eight) hours as needed for cough.    levothyroxine (SYNTHROID) 100 MCG tablet TAKE ONE TABLET BY MOUTH DAILY    MELATONIN PO Take 1 tablet by mouth daily.    Multiple Vitamins-Minerals (ICAPS AREDS 2 PO) Take 1 capsule by mouth in the morning and at bedtime.    omega-3 acid ethyl esters (LOVAZA) 1 g capsule TAKE ONE CAPSULE BY MOUTH TWICE A DAY    oxyCODONE-acetaminophen (PERCOCET) 5-325 MG tablet Take 0.5-1 tablets by mouth every 6 (six) hours as needed for severe pain (may cause constipation; do not take concurrently with tramadol or Fioricet).    Polyethylene Glycol 3350 (MIRALAX PO) Take by mouth. 1/2 capful every morning    predniSONE (DELTASONE) 10 MG tablet 40 mg x 3 days, 20 mg x 3 days, 10 mg x 3 days    pregabalin (LYRICA) 150 MG capsule Take 1  capsule (150 mg total) by mouth daily.    simvastatin (ZOCOR) 20 MG tablet Take 1 tablet (20 mg total) by mouth at bedtime.    topiramate (TOPAMAX) 25 MG tablet TAKE ONE TABLET BY MOUTH DAILY    traMADol (ULTRAM) 50 MG tablet Take 50 mg by mouth every 6 (six) hours as needed.    venlafaxine XR (EFFEXOR-XR) 37.5 MG 24 hr capsule TAKE ONE CAPSULE BY MOUTH DAILY  WITH BREAKFAST    No facility-administered encounter medications on file as of 11/26/2021.    11/26/2021 Name: Jean Nash MRN: [id number] DOB: [birth year] Jean Nash is a 81 y.o. year old female who is a primary care patient of [name], NP.  Comprehensive medication review performed; Spoke to patient regarding cholesterol  Lipid Panel    Component Value Date/Time   CHOL 209 (H) 03/06/2021 1037   TRIG 101.0 03/06/2021 1037   HDL 51.10 03/06/2021 1037   LDLCALC 138 (H) 03/06/2021 1037   LDLDIRECT 113.0 05/17/2016 0749    10-year ASCVD risk score: The 10-year ASCVD risk score (Arnett DK, et al., 2019) is: 19.3%   Values used to calculate the score:     Age: 60 years     Sex: Female     Is Non-Hispanic African American: No     Diabetic: No     Tobacco smoker: No     Systolic Blood Pressure: 767 mmHg     Is BP treated: No     HDL Cholesterol: 51.1 mg/dL     Total Cholesterol: 209 mg/dL  Current antihyperlipidemic regimen:  Simvastatin 20 mg 1 tablet daily Omega 3 1 g 1 capsule twice daily Previous antihyperlipidemic medications tried: None ASCVD risk enhancing conditions: age >17 What recent interventions/DTPs have been made by any provider to improve Cholesterol control since last CPP Visit: Patients husband reports no change Any recent hospitalizations or ED visits since last visit with CPP? No Call to patient spoke to husband he reports as far as her medications go , no complaints of any side effects or issues. He reports she has been feeling tired and still dealing with her ulcers, has been in contact with her PCP and they are working on scheduling her MRI's with [facility].  Adherence Review: Does the patient have >5 day gap between last estimated fill dates? No       Care Gaps: COVID Booster #4 - Overdue Flu Vaccine - Overdue AWV - 9/22  Star Rating Drugs: Simvastatin 20 mg - Last filled 03/10/2021 365 DS  at [facility] Verified as  accurate     Ned Clines [place] Clinical Pharmacist Assistant [phone]

## 2021-11-26 NOTE — Telephone Encounter (Signed)
Called pt and he advised that [facility] imaging told him that a PA and a referral was needed. Referral has been in the system already. Per referral note some issues with verifying insurance came up. Per  insurance [t does not need a PA. Pt notified that I will reach out to referral coordinator and call him back tomorrow.

## 2021-11-26 NOTE — Telephone Encounter (Signed)
I received a message from [4:46 PM] Jean Nash can you please update [name] on Jean Nash, 81 y.o., Jan 01, 1941 MRN:  [id number] Phone:  [phone]  [4:53 PM] Jean Nash HEY pt have been up dated on this multiple of times - [place] [facility] imaging has spoken to the pt .THERE IS A PROBLEM WITH HER INSURANCE STATING SHE IS not active  tarron from [place] imaging  is helping me its something on there end also  that's showing that [facility] imaging is not in network - i called the pt spoke with her husband he is going to call to see who else is in network ... the problem is that we can not get it authorized we are working on it    I sent Jean Nash at [facility] imaging- in per cert  because she stated she was going to get someone to help with this on her end because it may be the site location and she was going to talk to her supervisor    message on community chat   have you heard anything about this pt - I tried again to get her authorized again today and  I called the rep stated again   she is not active  and  husband called our office back today  and said she is  active  , and he wanted to know when will she be scheduled I do not know what to do .

## 2021-11-27 NOTE — Telephone Encounter (Signed)
Per Nonah Mattes   pt have been up dated on this multiple of times - [place] imaging has spoken to the pt .There is a problem with there insurance stating that she is not active  tarron from [facility] imaging  is helping me its something on there end also  that's showing that [facility] is not in network - i called the pt spoke with her husband he is going to call to see who else is in network ... the problem is that we can not get it authorized we are working on it

## 2021-12-02 ENCOUNTER — Telehealth: Payer: Self-pay | Admitting: Adult Health

## 2021-12-02 NOTE — Telephone Encounter (Signed)
Patient calling in with respiratory symptoms: Shortness of breath, chest pain, palpitations or other red words send to Triage  Does the patient have a fever over 100, cough, congestion, sore throat, runny nose, lost of taste/smell (please list symptoms that patient has)? Diarrhea, no appetite, no energy unable to get out of bed at date did symptoms start?11-29-2021 (If over 5 days ago, pt may be scheduled for in person visit)  Have you tested for Covid in the last 5 days? No   If yes, was it positive []  OR negative [] ? If positive in the last 5 days, please schedule virtual visit now. If negative, schedule for an in person OV with the next available provider if PCP has no openings. Please also let patient know they will be tested again (follow the script below)  "you will have to arrive 92mins prior to your appt time to be Covid tested. Please park in back of office at the cone & call [phone] to let the staff know you have arrived. A staff member will meet you at your car to do a rapid covid test. Once the test has resulted you will be notified by phone of your results to determine if appt will remain an in person visit or be converted to a virtual/phone visit. If you arrive less than 50mins before your appt time, your visit will be automatically converted to virtual & any recommended testing will happen AFTER the visit." Pt has a virtual appt with cory on 12-03-2021  THINGS TO REMEMBER  If no availability for virtual visit in office,  please schedule another [place] office  If no availability at another [place] office, please instruct patient that they can schedule an evisit or virtual visit through their mychart account. Visits up to 8pm  patients can be seen in office 5 days after positive COVID test

## 2021-12-03 ENCOUNTER — Telehealth (INDEPENDENT_AMBULATORY_CARE_PROVIDER_SITE_OTHER): Payer: Medicare HMO | Admitting: Adult Health

## 2021-12-03 ENCOUNTER — Encounter: Payer: Self-pay | Admitting: Adult Health

## 2021-12-03 VITALS — HR 83 | Ht 59.0 in | Wt 176.0 lb

## 2021-12-03 DIAGNOSIS — B349 Viral infection, unspecified: Secondary | ICD-10-CM | POA: Diagnosis not present

## 2021-12-03 DIAGNOSIS — G8929 Other chronic pain: Secondary | ICD-10-CM | POA: Diagnosis not present

## 2021-12-03 DIAGNOSIS — M542 Cervicalgia: Secondary | ICD-10-CM | POA: Diagnosis not present

## 2021-12-03 DIAGNOSIS — M545 Low back pain, unspecified: Secondary | ICD-10-CM

## 2021-12-03 NOTE — Progress Notes (Signed)
Virtual Visit via Video Note  I connected with Jean Nash on 12/03/21 at  9:00 AM EST by a video enabled telemedicine application and verified that I am speaking with the correct person using two identifiers.  Location patient: home Location provider:work or home office Persons participating in the virtual visit: patient, provider  I discussed the limitations of evaluation and management by telemedicine and the availability of in person appointments. The patient expressed understanding and agreed to proceed.   HPI: 81 year old female who is being evaluated today for an acute issue.  Symptoms started roughly 4 days ago.  She had 2 to 3 days of severe diarrhea, fatigue, weakness, watery eyes, sinus congestion and nausea without vomiting.  She found it difficult to walk a few steps to her bathroom without feeling exhausted.  She did take a COVID test which was negative.  He denies fevers or chills.  Diarrhea resolved roughly 24 hours ago.  When she woke up this morning she was feeling much better.  No longer having nausea, has much fatigue or weakness and is able to eat and drink without difficulty.  Husband reports similar symptoms approximately 1 week ago.  Additionally, back in October 2022 MRI of the lumbar spine was ordered due to chronic low back pain worsening in nature as well as an MRI of the cervical spine for cervical spine pain as well as a popping sensation.  There has been some issues between [facility] imaging and her insurance company and she has not been able to get this done yet.  [facility] is saying that her insurance is not active but the insurance is in fact active and they have checked multiple times with her insurance carrier.   ROS: See pertinent positives and negatives per HPI.  Past Medical History:  Diagnosis Date   Adenomatous colon polyp    Anxiety    PHOBIAS   Arthritis    Breast cancer ([facility]) 08/08/13   right LOQ   Cataract    Chronic insomnia     Cluster headaches    history of migraines / NONE FOR SEVERAL YRS   Depression    Diverticulosis    Fatty liver 2011   Fibromyalgia    GERD (gastroesophageal reflux disease)    H/O hiatal hernia    History of transfusion 08/30/2013   Hx of radiation therapy 10/29/13- 12/14/13   right chest wall 5040 cGy 28 sessions, right supraclavicular/axillary region 5040 cGy 28 sessions, right chest wall boost 1000 cGy 5 sessions   Hypothyroidism    Internal hemorrhoids    Irritable bowel syndrome    Kidney stone    Lymphedema    RT ARM - WEARS SLEEVE   Macular degeneration    hole/right eye   MDD (major depressive disorder)    Osteopenia    Other abnormal glucose    Other and unspecified hyperlipidemia    Pain in joint, shoulder region    Pneumonia 9767,3419   Sleep apnea    USES C-PAP   Stress incontinence, female     Past Surgical History:  Procedure Laterality Date   ABDOMINAL HYSTERECTOMY     APPENDECTOMY     BILATERAL TOTAL MASTECTOMY WITH AXILLARY LYMPH NODE DISSECTION  08/30/2013   Dr Barry Dienes   BREAST CYST ASPIRATION     9 cysts   CATARACT EXTRACTION, BILATERAL  2005/2007   CHOLECYSTECTOMY     COLONOSCOPY     CYSTOCELE REPAIR     EVACUATION BREAST HEMATOMA Left 08/31/2013  Procedure: EVACUATION HEMATOMA BREAST;  Surgeon: Stark Klein, MD;  Location: [facility];  Service: General;  Laterality: Left;   EYE SURGERY     to repair macular hole   FOOT ARTHROPLASTY     lt    GANGLION CYST EXCISION     rt foot   HEMORRHOID SURGERY     03/1993   JOINT REPLACEMENT  03/15/11   left knee replacement   KNEE ARTHROSCOPY     /partial knee 2016/left knee 2012   MASS EXCISION  11/04/2011   Procedure: EXCISION MASS;  Surgeon: Cammie Sickle., MD;  Location: [place];  Service: Orthopedics;  Laterality: Right;  excisional biopsy right ulna mass   MASTECTOMY W/ SENTINEL NODE BIOPSY Right 08/30/2013   Procedure: RIGHT  AXILLARY SENTINEL LYMPH NODE BIOPSY; Right Axillary  Node Disection;  Surgeon: Stark Klein, MD;  Location: [place];  Service: General;  Laterality: Right;  Right side nuc med 7:00    PARTIAL KNEE ARTHROPLASTY Right 11/03/2015   Procedure: RIGHT KNEE MEDIAL UNICOMPARTMENTAL ARTHROPLASTY ;  Surgeon: Gaynelle Arabian, MD;  Location: [facility];  Service: Orthopedics;  Laterality: Right;   RECTOCELE REPAIR     SIMPLE MASTECTOMY WITH AXILLARY SENTINEL NODE BIOPSY Left 08/30/2013   Procedure: Bilateral Breast Mastectomy ;  Surgeon: Stark Klein, MD;  Location: [place];  Service: General;  Laterality: Left;   skin tags removed     breast, panty line, neckline   TOE SURGERY     preventative crossover toe surg/right foot   TOE SURGERY  2009   left foot/screw  in 2nd toe   TONSILLECTOMY     UPPER GASTROINTESTINAL ENDOSCOPY      Family History  Problem Relation Age of Onset   Stroke Mother        died age 58   Diabetes Mother    Breast cancer Mother 74   Breast cancer Sister 44   Breast cancer Paternal Aunt 33   Diabetes Maternal Grandfather    Breast cancer Paternal Grandmother 45   Breast cancer Paternal Aunt        dx in her 14s   Cancer Maternal Grandmother        intra-abdominal cancer   Brain cancer Maternal Uncle 56   Brain cancer Cousin 29       maternal cousin   Brain cancer Cousin 30       paternal cousin   Colon cancer Neg Hx        Current Outpatient Medications:    Bacillus Coagulans-Inulin (PROBIOTIC) 1-250 BILLION-MG CAPS, Take by mouth., Disp: , Rfl:    butalbital-acetaminophen-caffeine (FIORICET) 50-325-40 MG tablet, Take 1 tablet by mouth as needed for headache., Disp: 30 tablet, Rfl: 1   Calcium Carbonate-Vitamin D (CALCIUM-VITAMIN D3 PO), Take 1 tablet by mouth in the morning and at bedtime., Disp: , Rfl:    Cholecalciferol (VITAMIN D3) 50 MCG (2000 UT) TABS, Take 2 tablets by mouth daily., Disp: , Rfl:    cyclobenzaprine (FLEXERIL) 10 MG tablet, Take 1 tablet (10 mg total) by mouth 3 (three) times daily as needed for muscle  spasms., Disp: 30 tablet, Rfl: 0   diazepam (VALIUM) 5 MG tablet, Take 5 mg by mouth every 6 (six) hours as needed for anxiety. As needed (flying & dental), Disp: , Rfl:    esomeprazole (NEXIUM) 40 MG capsule, Take 1 capsule (40 mg total) by mouth daily before breakfast. Take 1 capsule by mouth  daily, Disp: 365 capsule,  Rfl: 0   famotidine (PEPCID) 40 MG tablet, TAKE ONE TABLET BY MOUTH EVERY NIGHT AT BEDTIME, Disp: 90 tablet, Rfl: 1   HYDROcodone bit-homatropine (HYCODAN) 5-1.5 MG/5ML syrup, Take 5 mLs by mouth every 8 (eight) hours as needed for cough., Disp: 120 mL, Rfl: 0   levothyroxine (SYNTHROID) 100 MCG tablet, TAKE ONE TABLET BY MOUTH DAILY, Disp: 365 tablet, Rfl: 0   MELATONIN PO, Take 1 tablet by mouth daily., Disp: , Rfl:    Multiple Vitamins-Minerals (ICAPS AREDS 2 PO), Take 1 capsule by mouth in the morning and at bedtime., Disp: , Rfl:    omega-3 acid ethyl esters (LOVAZA) 1 g capsule, TAKE ONE CAPSULE BY MOUTH TWICE A DAY, Disp: 180 capsule, Rfl: 3   oxyCODONE-acetaminophen (PERCOCET) 5-325 MG tablet, Take 0.5-1 tablets by mouth every 6 (six) hours as needed for severe pain (may cause constipation; do not take concurrently with tramadol or Fioricet)., Disp: 10 tablet, Rfl: 0   Polyethylene Glycol 3350 (MIRALAX PO), Take by mouth. 1/2 capful every morning, Disp: , Rfl:    predniSONE (DELTASONE) 10 MG tablet, 40 mg x 3 days, 20 mg x 3 days, 10 mg x 3 days, Disp: 21 tablet, Rfl: 0   simvastatin (ZOCOR) 20 MG tablet, Take 1 tablet (20 mg total) by mouth at bedtime., Disp: 360 tablet, Rfl: 0   topiramate (TOPAMAX) 25 MG tablet, TAKE ONE TABLET BY MOUTH DAILY, Disp: 325 tablet, Rfl: 0   traMADol (ULTRAM) 50 MG tablet, Take 50 mg by mouth every 6 (six) hours as needed., Disp: , Rfl:    venlafaxine XR (EFFEXOR-XR) 37.5 MG 24 hr capsule, TAKE ONE CAPSULE BY MOUTH DAILY WITH BREAKFAST, Disp: 90 capsule, Rfl: 1   pregabalin (LYRICA) 150 MG capsule, Take 1 capsule (150 mg total) by mouth daily.,  Disp: 90 capsule, Rfl: 0  EXAM:  VITALS per patient if applicable:  GENERAL: alert, oriented, appears well and in no acute distress  HEENT: atraumatic, conjunttiva clear, no obvious abnormalities on inspection of external nose and ears  NECK: normal movements of the head and neck  LUNGS: on inspection no signs of respiratory distress, breathing rate appears normal, no obvious gross SOB, gasping or wheezing  CV: no obvious cyanosis  MS: moves all visible extremities without noticeable abnormality  PSYCH/NEURO: pleasant and cooperative, no obvious depression or anxiety, speech and thought processing grossly intact  ASSESSMENT AND PLAN:  Discussed the following assessment and plan:  1. Viral illness -I am glad she is feeling better.  Encouraged push fluids throughout the next 24 to 36 hours to get rehydrated.  Stay active.  Follow-up if symptoms return  2. Chronic bilateral low back pain without sciatica -We will replace orders for MRI of lumbar spine and cervical spine to be done at [facility] setting [facility] imaging. - MR Lumbar Spine Wo Contrast; Future  3. Chronic cervical pain  - MR Cervical Spine Wo Contrast; Future  [name], NP       I discussed the assessment and treatment plan with the patient. The patient was provided an opportunity to ask questions and all were answered. The patient agreed with the plan and demonstrated an understanding of the instructions.   The patient was advised to call back or seek an in-person evaluation if the symptoms worsen or if the condition fails to improve as anticipated.   [name], NP

## 2021-12-04 ENCOUNTER — Ambulatory Visit: Payer: Medicare HMO | Admitting: Adult Health

## 2021-12-06 ENCOUNTER — Encounter: Payer: Self-pay | Admitting: Adult Health

## 2021-12-08 ENCOUNTER — Other Ambulatory Visit: Payer: Self-pay | Admitting: Adult Health

## 2021-12-08 MED ORDER — VENLAFAXINE HCL ER 37.5 MG PO CP24: ORAL_CAPSULE | ORAL | 1 refills | Status: DC

## 2021-12-15 NOTE — Progress Notes (Signed)
Office Visit Note  Patient: Jean Nash             Date of Birth: Oct 15, 1941           MRN: [id number]             PCP: [name], NP Referring: [name], NP Visit Date: 12/29/2021 Occupation: @GUAROCC @  Subjective:  Pain in multiple joints  History of Present Illness: Jean Nash is a 81 y.o. female with history of fibromyalgia, osteoarthritis, and degenerative disc disease.  She continues to have pain and discomfort in her bilateral hands especially over bilateral CMC joints.  She also has discomfort in her knee joints which are being replaced.  She states she has chronic lower back pain and she has difficulty walking due to lower back pain.  She has difficulty climbing stairs and getting out of the chair.  She has no history of inflammatory arthritis.  Fibromyalgia symptoms are manageable.  Activities of Daily Living:  Patient reports morning stiffness for 24 hours.   Patient Reports nocturnal pain.  Difficulty dressing/grooming: Reports Difficulty climbing stairs: Reports Difficulty getting out of chair: Reports Difficulty using hands for taps, buttons, cutlery, and/or writing: Reports  Review of Systems  Constitutional:  Positive for fatigue.  HENT:  Negative for mouth dryness.   Eyes:  Negative for dryness.  Respiratory:  Negative for shortness of breath.   Cardiovascular:  Negative for swelling in legs/feet.  Gastrointestinal:  Positive for constipation and diarrhea.  Endocrine: Negative for excessive thirst.  Genitourinary:  Negative for difficulty urinating.  Musculoskeletal:  Positive for joint pain, joint pain, joint swelling and morning stiffness.  Skin:  Negative for color change and sensitivity to sunlight.  Allergic/Immunologic: Negative for susceptible to infections.  Neurological:  Positive for weakness.  Hematological:  Negative for bruising/bleeding tendency and swollen glands.  Psychiatric/Behavioral:  Positive for sleep disturbance.     PMFS History:  Patient Active Problem List   Diagnosis Date Noted   DDD (degenerative disc disease), cervical 12/29/2021   Genetic testing 08/05/2020   Fibromyalgia    MDD (major depressive disorder), recurrent, in full remission ([place]) 12/10/2019   Pulmonary fibrosis ([place]) 12/04/2018   MDD (major depressive disorder), recurrent episode, mild ([place]) 12/22/2017   Osteoporosis 07/14/2017   OA (osteoarthritis) of knee 11/03/2015   Osteopenia 08/08/2015   Headache 02/03/2015   Atypical chest pain 07/15/2014   Arthralgia 02/22/2014   Psoriasis 02/22/2014   Malignant neoplasm of lower-outer quadrant of right breast of female, estrogen receptor positive ([place]) 08/09/2013   Neck pain 06/22/2013   Left knee pain 11/29/2012   Reactive depression (situational) 04/26/2012   Right wrist pain 02/02/2012   Right foot pain 10/04/2011   Nevus 06/05/2011   Status post total knee replacement 04/06/2011   Overactive bladder 03/14/2011   KNEE PAIN, LEFT 08/31/2010   HEARING LOSS 08/17/2010   HIRSUTISM 06/16/2009   CYST, IDIOPATHIC 05/20/2008   IRRITABLE BOWEL SYNDROME 04/15/2008   Allergic asthma, mild intermittent, uncomplicated 00/93/8182   GERD 03/19/2008   COLONIC POLYPS, HX OF 03/19/2008   NEPHROLITHIASIS, HX OF 03/19/2008   Hypothyroidism 03/18/2008   Hyperlipidemia 03/18/2008   INSOMNIA, CHRONIC 03/18/2008   FIBROMYALGIA 03/18/2008   Prediabetes 03/18/2008    Past Medical History:  Diagnosis Date   Adenomatous colon polyp    Anxiety    PHOBIAS   Arthritis    Breast cancer ([place]) 08/08/13   right LOQ   Cataract    Chronic insomnia  Cluster headaches    history of migraines / NONE FOR SEVERAL YRS   Depression    Diverticulosis    Fatty liver 2011   Fibromyalgia    GERD (gastroesophageal reflux disease)    H/O hiatal hernia    History of transfusion 08/30/2013   Hx of radiation therapy 10/29/13- 12/14/13   right chest wall 5040 cGy 28 sessions, right  supraclavicular/axillary region 5040 cGy 28 sessions, right chest wall boost 1000 cGy 5 sessions   Hypothyroidism    Internal hemorrhoids    Irritable bowel syndrome    Kidney stone    Lymphedema    RT ARM - WEARS SLEEVE   Macular degeneration    hole/right eye   MDD (major depressive disorder)    Osteopenia    Other abnormal glucose    Other and unspecified hyperlipidemia    Pain in joint, shoulder region    Pneumonia 3818,2993   Sleep apnea    USES C-PAP   Stress incontinence, female     Family History  Problem Relation Age of Onset   Stroke Mother        died age 26   Diabetes Mother    Breast cancer Mother 64   Breast cancer Sister 51   Breast cancer Paternal Aunt 56   Diabetes Maternal Grandfather    Breast cancer Paternal Grandmother 14   Breast cancer Paternal Aunt        dx in her 64s   Cancer Maternal Grandmother        intra-abdominal cancer   Brain cancer Maternal Uncle 36   Brain cancer Cousin 62       maternal cousin   Brain cancer Cousin 13       paternal cousin   Colon cancer Neg Hx    Past Surgical History:  Procedure Laterality Date   ABDOMINAL HYSTERECTOMY     APPENDECTOMY     BILATERAL TOTAL MASTECTOMY WITH AXILLARY LYMPH NODE DISSECTION  08/30/2013   Dr Barry Dienes   BREAST CYST ASPIRATION     9 cysts   CATARACT EXTRACTION, BILATERAL  2005/2007   CHOLECYSTECTOMY     COLONOSCOPY     CYSTOCELE REPAIR     EVACUATION BREAST HEMATOMA Left 08/31/2013   Procedure: EVACUATION HEMATOMA BREAST;  Surgeon: Stark Klein, MD;  Location: [facility];  Service: General;  Laterality: Left;   EYE SURGERY     to repair macular hole   FOOT ARTHROPLASTY     lt    GANGLION CYST EXCISION     rt foot   HEMORRHOID SURGERY     03/1993   JOINT REPLACEMENT  03/15/11   left knee replacement   KNEE ARTHROSCOPY     /partial knee 2016/left knee 2012   MASS EXCISION  11/04/2011   Procedure: EXCISION MASS;  Surgeon: Cammie Sickle., MD;  Location: [place];  Service: Orthopedics;  Laterality: Right;  excisional biopsy right ulna mass   MASTECTOMY W/ SENTINEL NODE BIOPSY Right 08/30/2013   Procedure: RIGHT  AXILLARY SENTINEL LYMPH NODE BIOPSY; Right Axillary Node Disection;  Surgeon: Stark Klein, MD;  Location: [facility];  Service: General;  Laterality: Right;  Right side nuc med 7:00    PARTIAL KNEE ARTHROPLASTY Right 11/03/2015   Procedure: RIGHT KNEE MEDIAL UNICOMPARTMENTAL ARTHROPLASTY ;  Surgeon: Gaynelle Arabian, MD;  Location: [facility];  Service: Orthopedics;  Laterality: Right;   RECTOCELE REPAIR     SIMPLE MASTECTOMY WITH AXILLARY SENTINEL NODE BIOPSY  Left 08/30/2013   Procedure: Bilateral Breast Mastectomy ;  Surgeon: Stark Klein, MD;  Location: [facility];  Service: General;  Laterality: Left;   skin tags removed     breast, panty line, neckline   TOE SURGERY     preventative crossover toe surg/right foot   TOE SURGERY  2009   left foot/screw  in 2nd toe   TONSILLECTOMY     UPPER GASTROINTESTINAL ENDOSCOPY     Social History   Social History Narrative   Occupation:  Retired Radiation protection practitioner    Married with 3 grown children      Never Smoked     Alcohol use-no        Immunization History  Administered Date(s) Administered   Fluad Quad(high Dose 65+) 07/13/2019, 10/01/2020, 07/31/2021   Influenza Split 07/27/2012   Influenza Whole 09/13/2007, 08/08/2008, 07/30/2009, 08/25/2010   Influenza, High Dose Seasonal PF 08/03/2014, 08/08/2015, 08/23/2016, 07/14/2017, 07/20/2018   Influenza,inj,Quad PF,6+ Mos 07/25/2013   PFIZER Comirnaty(Gray Top)Covid-19 Tri-Sucrose Vaccine 09/07/2021   PFIZER(Purple Top)SARS-COV-2 Vaccination 12/23/2019, 01/17/2020, 08/18/2020   Pneumococcal Conjugate-13 08/16/2013   Pneumococcal Polysaccharide-23 06/18/2008   Tdap 11/29/2012   Zoster Recombinat (Shingrix) 04/11/2018, 09/22/2018   Zoster, Live 08/05/2011     Objective: Vital Signs: BP (!) 143/83 (BP Location: Left Arm, Patient Position: Sitting, Cuff  Size: Small)    Pulse 69    Resp 12    Ht 4\' 9"  (1.448 m)    Wt 176 lb 3.2 oz (79.9 kg)    BMI 38.13 kg/m    Physical Exam Vitals and nursing note reviewed.  Constitutional:      Appearance: She is well-developed.  HENT:     Head: Normocephalic and atraumatic.  Eyes:     Conjunctiva/sclera: Conjunctivae normal.  Cardiovascular:     Rate and Rhythm: Normal rate and regular rhythm.     Heart sounds: Normal heart sounds.  Pulmonary:     Effort: Pulmonary effort is normal.     Breath sounds: Normal breath sounds.  Abdominal:     General: Bowel sounds are normal.     Palpations: Abdomen is soft.  Musculoskeletal:     Cervical back: Normal range of motion.  Lymphadenopathy:     Cervical: No cervical adenopathy.  Skin:    General: Skin is warm and dry.     Capillary Refill: Capillary refill takes less than 2 seconds.  Neurological:     Mental Status: She is alert and oriented to person, place, and time.  Psychiatric:        Behavior: Behavior normal.     Musculoskeletal Exam: C-spine was in good range of motion.  She had painful limited range of motion of her lumbar spine.  Shoulder joints, elbow joints, wrist joints with good range of motion.  She had bilateral CMC thickening and subluxation.  There was no PIP and DIP tenderness.  Hip joints with good range of motion.  Bilateral knee joints were replaced and were in good range of motion.  She had no warmth swelling or effusion.  There was no tenderness over ankles or MTPs.  CDAI Exam: CDAI Score: -- Patient Global: --; Provider Global: -- Swollen: --; Tender: -- Joint Exam 12/29/2021   No joint exam has been documented for this visit   There is currently no information documented on the homunculus. Go to the Rheumatology activity and complete the homunculus joint exam.  Investigation: No additional findings.  Imaging: MR Cervical Spine Wo Contrast  Addendum Date: 12/22/2021  ADDENDUM REPORT: 12/22/2021 13:56 ADDENDUM:  Omitted finding of chronic small vessel ischemia incidentally seen in the pons. Electronically Signed   By: Jorje Guild M.D.   On: 12/22/2021 13:56   Result Date: 12/22/2021 CLINICAL DATA:  Chronic cervical neck pain EXAM: MRI CERVICAL SPINE WITHOUT CONTRAST TECHNIQUE: Multiplanar, multisequence MR imaging of the cervical spine was performed. No intravenous contrast was administered. COMPARISON:  None similar FINDINGS: Alignment: Straightening of the cervical spine with slight anterolisthesis at C3-4 and C7-T1 Vertebrae: No fracture, evidence of discitis, or bone lesion. Cord: Normal signal and morphology. There is a roughly isointense nodule in the left posterior canal at the level of C4, closely associated with the dorsal root of the C4 nerve. Posterior Fossa, vertebral arteries, paraspinal tissues: Negative. Disc levels: C2-3: Unremarkable. C3-4: Mild facet spurring.  Tiny central disc protrusion C4-5: Disc collapse with endplate and uncovertebral ridging. Bilateral facet spurring. Moderate bilateral foraminal narrowing. C5-6: Disc collapse with bulging and spurring eccentric to the left. Advanced left and moderate right foraminal stenosis. Mild spinal stenosis C6-7: Disc narrowing and bulging with endplate and uncovertebral ridging. Bilateral foraminal narrowing that is mild to moderate and greater on the left. C7-T1:Mild facet spurring and anterolisthesis. Root sleeve cyst on the left. IMPRESSION: 1. Generalized cervical spine degeneration with reversal of cervical lordosis. 2. Foraminal stenosis at C4-5 to C6-7, as above. 3. Diffusely patent spinal canal. 4. 4 mm nodule in the left posterior canal at the level of C4. The nodule is closely associated with the dorsal root, favoring nerve sheath. Recommend imaging follow-up, preferably with contrast. Electronically Signed: By: Jorje Guild M.D. On: 12/22/2021 13:34   MR Lumbar Spine Wo Contrast  Result Date: 12/23/2021 CLINICAL DATA:  Chronic bilateral  low back pain. EXAM: MRI LUMBAR SPINE WITHOUT CONTRAST TECHNIQUE: Multiplanar, multisequence MR imaging of the lumbar spine was performed. No intravenous contrast was administered. COMPARISON:  X-ray lumbar 11/27/2020. FINDINGS: Segmentation:  Standard. Alignment: Grade 1 anterolisthesis of L3 on L4 and L4 on L5 secondary to facet disease. Vertebrae: No acute fracture, evidence of discitis, or aggressive bone lesion. Conus medullaris and cauda equina: Conus extends to the T12-L1 level. Conus and cauda equina appear normal. Paraspinal and other soft tissues: No acute paraspinal abnormality. Other: Mild osteoarthritis of bilateral SI joints. Disc levels: Disc spaces: Degenerative disease with disc height loss at L2-3, L3-4 and L4-5. T12-L1: Minimal broad-based disc bulge. No foraminal or central canal stenosis. L1-L2: Mild broad-based disc bulge. No foraminal or central canal stenosis. L2-L3: Mild broad-based disc bulge. Mild bilateral facet arthropathy with ligamentum flavum infolding. Bilateral lateral recess stenosis. Mild spinal stenosis. Moderate bilateral foraminal stenosis. L3-L4: Broad-based disc bulge. Severe bilateral facet arthropathy. Moderate spinal stenosis. Bilateral subarticular recess stenosis. Moderate bilateral foraminal stenosis. L4-L5: Mild broad-based disc bulge. Severe bilateral facet arthropathy. Right lateral recess stenosis. No foraminal or central canal stenosis. L5-S1: No significant disc bulge. No neural foraminal stenosis. No central canal stenosis. IMPRESSION: 1. Diffuse lumbar spine spondylosis as described above. 2. No acute osseous injury of the lumbar spine. Electronically Signed   By: Kathreen Devoid M.D.   On: 12/23/2021 09:11    Recent Labs: Lab Results  Component Value Date   WBC 7.7 06/25/2021   HGB 15.2 (H) 06/25/2021   PLT 189.0 06/25/2021   NA 140 03/06/2021   K 4.6 03/06/2021   CL 104 03/06/2021   CO2 27 03/06/2021   GLUCOSE 122 (H) 03/06/2021   BUN 17 03/06/2021    CREATININE 0.75 03/06/2021  BILITOT 0.7 03/06/2021   ALKPHOS 76 03/06/2021   AST 17 03/06/2021   ALT 27 03/06/2021   PROT 6.5 03/06/2021   ALBUMIN 4.3 03/06/2021   CALCIUM 9.5 03/06/2021   GFRAA 77 09/12/2020    Speciality Comments: No specialty comments available.  Procedures:  No procedures performed Allergies: Patient has no known allergies.   Assessment / Plan:     Visit Diagnoses: Fibromyalgia-she continues to have some generalized pain and discomfort.  She had tender points on examination today.  Need for regular exercise was emphasized.  Stretching exercises were discussed.  Primary osteoarthritis of both hands - discomfort in the left CMC joint.  She has been [facility] and had injections x2 without much results.  I discussed the option of cortisone injection if her pain gets worse.  Use of topical Voltaren gel and CMC braces was discussed.  She has [place] braces which she does not use.  Her symptoms get exacerbated by knitting.  Joint protection was discussed.  A handout on exercises was given.  Status post total left knee replacement-she had good range of motion without warmth swelling or effusion.  Status post right partial knee replacement-she gives history of chronic pain.  No warmth swelling or effusion was noted.  DDD (degenerative disc disease), cervical - Multilevel spondylosis and facet joint arthropathy noted on the MRI December 23, 2021.  MRI findings were reviewed with the patient.  DDD (degenerative disc disease), lumbar - Multilevel spondylosis, facet joint arthropathy and moderate to spinal stenosis noted on the MRI obtained on December 23, 2021.  MRI findings were reviewed with the patient.  Core strengthening exercises were demonstrated and discussed.  A handout on exercises was given.  I will also refer her to physical therapy.  Other medical problems are listed as follows:  Pulmonary fibrosis (HCC)  Allergic asthma, mild intermittent,  uncomplicated  Prediabetes  History of hyperlipidemia  Malignant neoplasm of lower-outer quadrant of right breast of female, estrogen receptor positive ([facility])  History of gastroesophageal reflux (GERD)  History of IBS  History of colonic polyps  MDD (major depressive disorder), recurrent episode, mild ([place])  History of hypothyroidism  History of nephrolithiasis  Orders: No orders of the defined types were placed in this encounter.  No orders of the defined types were placed in this encounter.   Follow-Up Instructions: Return in about 6 months (around 06/28/2022) for Osteoarthritis.   Bo Merino, MD  Note - This record has been created using Editor, commissioning.  Chart creation errors have been sought, but may not always  have been located. Such creation errors do not reflect on  the standard of medical care.

## 2021-12-21 ENCOUNTER — Ambulatory Visit (HOSPITAL_COMMUNITY)
Admission: RE | Admit: 2021-12-21 | Discharge: 2021-12-21 | Disposition: A | Discharge status: Home / Self Care | Payer: Medicare HMO | Source: Ambulatory Visit | Attending: Adult Health | Admitting: Adult Health

## 2021-12-21 ENCOUNTER — Other Ambulatory Visit: Payer: Self-pay

## 2021-12-21 DIAGNOSIS — M545 Low back pain, unspecified: Secondary | ICD-10-CM | POA: Insufficient documentation

## 2021-12-21 DIAGNOSIS — G8929 Other chronic pain: Secondary | ICD-10-CM

## 2021-12-21 DIAGNOSIS — M542 Cervicalgia: Secondary | ICD-10-CM | POA: Diagnosis present

## 2021-12-22 ENCOUNTER — Encounter: Payer: Self-pay | Admitting: Adult Health

## 2021-12-23 ENCOUNTER — Telehealth: Payer: Self-pay | Admitting: Adult Health

## 2021-12-23 DIAGNOSIS — M4802 Spinal stenosis, cervical region: Secondary | ICD-10-CM

## 2021-12-23 DIAGNOSIS — R937 Abnormal findings on diagnostic imaging of other parts of musculoskeletal system: Secondary | ICD-10-CM

## 2021-12-23 NOTE — Telephone Encounter (Signed)
Updated patient's spouse on patients MRI of cervical and lumbar spine. He reports that his wife is in significant pain and is having a hard time walking without significant discomfort.   MRI of cervical spine shows   IMPRESSION: 1. Generalized cervical spine degeneration with reversal of cervical lordosis. 2. Foraminal stenosis at C4-5 to C6-7, as above. 3. Diffusely patent spinal canal. 4. 4 mm nodule in the left posterior canal at the level of C4. The nodule is closely associated with the dorsal root, favoring nervesheath. Recommend imaging follow-up, preferably with contrast.  MRI of lumbar spine shows   IMPRESSION: 1. Diffuse lumbar spine spondylosis as described above. 2. No acute osseous injury of the lumbar spine.  Will order repeat cervical spine MRI with contrast for 4 mm nodule and refer to Neurosurgery for further evaluation

## 2021-12-29 ENCOUNTER — Encounter: Payer: Self-pay | Admitting: Rheumatology

## 2021-12-29 ENCOUNTER — Ambulatory Visit (INDEPENDENT_AMBULATORY_CARE_PROVIDER_SITE_OTHER): Payer: Medicare HMO | Admitting: Rheumatology

## 2021-12-29 ENCOUNTER — Other Ambulatory Visit: Payer: Self-pay

## 2021-12-29 VITALS — BP 143/83 | HR 69 | Resp 12 | Ht <= 58 in | Wt 176.2 lb

## 2021-12-29 DIAGNOSIS — Z860100 Personal history of colon polyps, unspecified: Secondary | ICD-10-CM

## 2021-12-29 DIAGNOSIS — Z96651 Presence of right artificial knee joint: Secondary | ICD-10-CM

## 2021-12-29 DIAGNOSIS — Z96652 Presence of left artificial knee joint: Secondary | ICD-10-CM | POA: Diagnosis not present

## 2021-12-29 DIAGNOSIS — M19042 Primary osteoarthritis, left hand: Secondary | ICD-10-CM

## 2021-12-29 DIAGNOSIS — F330 Major depressive disorder, recurrent, mild: Secondary | ICD-10-CM

## 2021-12-29 DIAGNOSIS — Z8719 Personal history of other diseases of the digestive system: Secondary | ICD-10-CM

## 2021-12-29 DIAGNOSIS — M51369 Other intervertebral disc degeneration, lumbar region without mention of lumbar back pain or lower extremity pain: Secondary | ICD-10-CM

## 2021-12-29 DIAGNOSIS — Z170 Estrogen receptor positive status [ER+]: Secondary | ICD-10-CM

## 2021-12-29 DIAGNOSIS — J8410 Pulmonary fibrosis, unspecified: Secondary | ICD-10-CM

## 2021-12-29 DIAGNOSIS — Z87442 Personal history of urinary calculi: Secondary | ICD-10-CM

## 2021-12-29 DIAGNOSIS — M5136 Other intervertebral disc degeneration, lumbar region: Secondary | ICD-10-CM

## 2021-12-29 DIAGNOSIS — C50511 Malignant neoplasm of lower-outer quadrant of right female breast: Secondary | ICD-10-CM

## 2021-12-29 DIAGNOSIS — M5030 Other cervical disc degeneration, unspecified cervical region: Secondary | ICD-10-CM

## 2021-12-29 DIAGNOSIS — G8929 Other chronic pain: Secondary | ICD-10-CM

## 2021-12-29 DIAGNOSIS — M797 Fibromyalgia: Secondary | ICD-10-CM

## 2021-12-29 DIAGNOSIS — R7303 Prediabetes: Secondary | ICD-10-CM

## 2021-12-29 DIAGNOSIS — M19041 Primary osteoarthritis, right hand: Secondary | ICD-10-CM | POA: Diagnosis not present

## 2021-12-29 DIAGNOSIS — Z8639 Personal history of other endocrine, nutritional and metabolic disease: Secondary | ICD-10-CM

## 2021-12-29 DIAGNOSIS — J4520 Mild intermittent asthma, uncomplicated: Secondary | ICD-10-CM

## 2021-12-29 NOTE — Addendum Note (Signed)
Addended by: Earnestine Mealing on: 12/29/2021 02:15 PM   Modules accepted: Orders

## 2021-12-29 NOTE — Patient Instructions (Signed)
Back Exercises The following exercises strengthen the muscles that help to support the trunk (torso) and back. They also help to keep the lower back flexible. Doing these exercises can help to prevent or lessen existing low back pain. If you have back pain or discomfort, try doing these exercises 2-3 times each day or as told by your health care provider. As your pain improves, do them once each day, but increase the number of times that you repeat the steps for each exercise (do more repetitions). To prevent the recurrence of back pain, continue to do these exercises once each day or as told by your health care provider. Do exercises exactly as told by your health care provider and adjust them as directed. It is normal to feel mild stretching, pulling, tightness, or discomfort as you do these exercises, but you should stop right away if you feel sudden pain or your pain gets worse. Exercises Single knee to chest Repeat these steps 3-5 times for each leg: Lie on your back on a firm bed or the floor with your legs extended. Bring one knee to your chest. Your other leg should stay extended and in contact with the floor. Hold your knee in place by grabbing your knee or thigh with both hands and hold. Pull on your knee until you feel a gentle stretch in your lower back or buttocks. Hold the stretch for 10-30 seconds. Slowly release and straighten your leg.  Pelvic tilt Repeat these steps 5-10 times: Lie on your back on a firm bed or the floor with your legs extended. Bend your knees so they are pointing toward the ceiling and your feet are flat on the floor. Tighten your lower abdominal muscles to press your lower back against the floor. This motion will tilt your pelvis so your tailbone points up toward the ceiling instead of pointing to your feet or the floor. With gentle tension and even breathing, hold this position for 5-10 seconds.  Cat-cow Repeat these steps until your lower back becomes  more flexible: Get into a hands-and-knees position on a firm bed or the floor. Keep your hands under your shoulders, and keep your knees under your hips. You may place padding under your knees for comfort. Let your head hang down toward your chest. Contract your abdominal muscles and point your tailbone toward the floor so your lower back becomes rounded like the back of a cat. Hold this position for 5 seconds. Slowly lift your head, let your abdominal muscles relax, and point your tailbone up toward the ceiling so your back forms a sagging arch like the back of a cow. Hold this position for 5 seconds.  Press-ups Repeat these steps 5-10 times: Lie on your abdomen (face-down) on a firm bed or the floor. Place your palms near your head, about shoulder-width apart. Keeping your back as relaxed as possible and keeping your hips on the floor, slowly straighten your arms to raise the top half of your body and lift your shoulders. Do not use your back muscles to raise your upper torso. You may adjust the placement of your hands to make yourself more comfortable. Hold this position for 5 seconds while you keep your back relaxed. Slowly return to lying flat on the floor.  Bridges Repeat these steps 10 times: Lie on your back on a firm bed or the floor. Bend your knees so they are pointing toward the ceiling and your feet are flat on the floor. Your arms should be flat  at your sides, next to your body. Tighten your buttocks muscles and lift your buttocks off the floor until your waist is at almost the same height as your knees. You should feel the muscles working in your buttocks and the back of your thighs. If you do not feel these muscles, slide your feet 1-2 inches (2.5-5 cm) farther away from your buttocks. Hold this position for 3-5 seconds. Slowly lower your hips to the starting position, and allow your buttocks muscles to relax completely. If this exercise is too easy, try doing it with your arms  crossed over your chest. Abdominal crunches Repeat these steps 5-10 times: Lie on your back on a firm bed or the floor with your legs extended. Bend your knees so they are pointing toward the ceiling and your feet are flat on the floor. Cross your arms over your chest. Tip your chin slightly toward your chest without bending your neck. Tighten your abdominal muscles and slowly raise your torso high enough to lift your shoulder blades a tiny bit off the floor. Avoid raising your torso higher than that because it can put too much stress on your lower back and does not help to strengthen your abdominal muscles. Slowly return to your starting position.  Back lifts Repeat these steps 5-10 times: Lie on your abdomen (face-down) with your arms at your sides, and rest your forehead on the floor. Tighten the muscles in your legs and your buttocks. Slowly lift your chest off the floor while you keep your hips pressed to the floor. Keep the back of your head in line with the curve in your back. Your eyes should be looking at the floor. Hold this position for 3-5 seconds. Slowly return to your starting position.  Contact a health care provider if: Your back pain or discomfort gets much worse when you do an exercise. Your worsening back pain or discomfort does not lessen within 2 hours after you exercise. If you have any of these problems, stop doing these exercises right away. Do not do them again unless your health care provider says that you can. Get help right away if: You develop sudden, severe back pain. If this happens, stop doing the exercises right away. Do not do them again unless your health care provider says that you can. This information is not intended to replace advice given to you by your health care provider. Make sure you discuss any questions you have with your health care provider. Document Revised: 04/28/2021 Document Reviewed: 01/14/2021 [employer] Patient Education  [place] Exercises Hand exercises can be helpful for almost anyone. These exercises can strengthen the hands, improve flexibility and movement, and increase blood flow to the hands. These results can make work and daily tasks easier. Hand exercises can be especially helpful for people who have joint pain from arthritis or have nerve damage from overuse (carpal tunnel syndrome). These exercises can also help people who have injured a hand. Exercises Most of these hand exercises are gentle stretching and motion exercises. It is usually safe to do them often throughout the day. Warming up your hands before exercise may help to reduce stiffness. You can do this with gentle massage or by placing your hands in warm water for 10-15 minutes. It is normal to feel some stretching, pulling, tightness, or mild discomfort as you begin new exercises. This will gradually improve. Stop an exercise right away if you feel sudden, severe pain or your pain gets worse. Ask your  health care provider which exercises are best for you. Knuckle bend or "claw" fist  Stand or sit with your arm, hand, and all five fingers pointed straight up. Make sure to keep your wrist straight during the exercise. Gently bend your fingers down toward your palm until the tips of your fingers are touching the top of your palm. Keep your big knuckle straight and just bend the small knuckles in your fingers. Hold this position for __________ seconds. Straighten (extend) your fingers back to the starting position. Repeat this exercise 5-10 times with each hand. Full finger fist  Stand or sit with your arm, hand, and all five fingers pointed straight up. Make sure to keep your wrist straight during the exercise. Gently bend your fingers into your palm until the tips of your fingers are touching the middle of your palm. Hold this position for __________ seconds. Extend your fingers back to the starting position, stretching every joint  fully. Repeat this exercise 5-10 times with each hand. Straight fist Stand or sit with your arm, hand, and all five fingers pointed straight up. Make sure to keep your wrist straight during the exercise. Gently bend your fingers at the big knuckle, where your fingers meet your hand, and the middle knuckle. Keep the knuckle at the tips of your fingers straight and try to touch the bottom of your palm. Hold this position for __________ seconds. Extend your fingers back to the starting position, stretching every joint fully. Repeat this exercise 5-10 times with each hand. Tabletop  Stand or sit with your arm, hand, and all five fingers pointed straight up. Make sure to keep your wrist straight during the exercise. Gently bend your fingers at the big knuckle, where your fingers meet your hand, as far down as you can while keeping the small knuckles in your fingers straight. Think of forming a tabletop with your fingers. Hold this position for __________ seconds. Extend your fingers back to the starting position, stretching every joint fully. Repeat this exercise 5-10 times with each hand. Finger spread  Place your hand flat on a table with your palm facing down. Make sure your wrist stays straight as you do this exercise. Spread your fingers and thumb apart from each other as far as you can until you feel a gentle stretch. Hold this position for __________ seconds. Bring your fingers and thumb tight together again. Hold this position for __________ seconds. Repeat this exercise 5-10 times with each hand. Making circles  Stand or sit with your arm, hand, and all five fingers pointed straight up. Make sure to keep your wrist straight during the exercise. Make a circle by touching the tip of your thumb to the tip of your index finger. Hold for __________ seconds. Then open your hand wide. Repeat this motion with your thumb and each finger on your hand. Repeat this exercise 5-10 times with each  hand. Thumb motion  Sit with your forearm resting on a table and your wrist straight. Your thumb should be facing up toward the ceiling. Keep your fingers relaxed as you move your thumb. Lift your thumb up as high as you can toward the ceiling. Hold for __________ seconds. Bend your thumb across your palm as far as you can, reaching the tip of your thumb for the small finger (pinkie) side of your palm. Hold for __________ seconds. Repeat this exercise 5-10 times with each hand. Grip strengthening  Hold a stress ball or other soft ball in the middle of  your hand. Slowly increase the pressure, squeezing the ball as much as you can without causing pain. Think of bringing the tips of your fingers into the middle of your palm. All of your finger joints should bend when doing this exercise. Hold your squeeze for __________ seconds, then relax. Repeat this exercise 5-10 times with each hand. Contact a health care provider if: Your hand pain or discomfort gets much worse when you do an exercise. Your hand pain or discomfort does not improve within 2 hours after you exercise. If you have any of these problems, stop doing these exercises right away. Do not do them again unless your health care provider says that you can. Get help right away if: You develop sudden, severe hand pain or swelling. If this happens, stop doing these exercises right away. Do not do them again unless your health care provider says that you can. This information is not intended to replace advice given to you by your health care provider. Make sure you discuss any questions you have with your health care provider. Document Revised: 02/19/2021 Document Reviewed: 02/19/2021 Elsevier Patient Education  [place].

## 2022-01-05 ENCOUNTER — Other Ambulatory Visit: Payer: Self-pay | Admitting: Adult Health

## 2022-01-05 ENCOUNTER — Telehealth: Payer: Self-pay | Admitting: Adult Health

## 2022-01-05 DIAGNOSIS — Z8669 Personal history of other diseases of the nervous system and sense organs: Secondary | ICD-10-CM

## 2022-01-05 NOTE — Telephone Encounter (Signed)
Pt's husband states [facility] has canceled on her more than once.  They aren't letting them know until they get there.  They want a referral to a different urologist.  He is wanting a call back about this matter.

## 2022-01-06 ENCOUNTER — Encounter: Payer: Self-pay | Admitting: Adult Health

## 2022-01-07 NOTE — Telephone Encounter (Signed)
Please see Mychart notes

## 2022-01-07 NOTE — Telephone Encounter (Signed)
Pt Spouse wants to know if Dr. Sherlene Shams, who is a urogynecologist at [facility] would be a good fit for [facility] [place].

## 2022-01-12 ENCOUNTER — Ambulatory Visit (HOSPITAL_COMMUNITY)
Admission: RE | Admit: 2022-01-12 | Discharge: 2022-01-12 | Disposition: A | Discharge status: Home / Self Care | Payer: Medicare HMO | Source: Ambulatory Visit | Attending: Adult Health | Admitting: Adult Health

## 2022-01-12 ENCOUNTER — Other Ambulatory Visit: Payer: Self-pay

## 2022-01-12 DIAGNOSIS — R937 Abnormal findings on diagnostic imaging of other parts of musculoskeletal system: Secondary | ICD-10-CM | POA: Diagnosis not present

## 2022-01-12 MED ORDER — GADOBUTROL 1 MMOL/ML IV SOLN
7.5000 mL | Freq: Once | INTRAVENOUS | Status: AC | PRN
  Administered 2022-01-12: 7.5 mL via INTRAVENOUS

## 2022-01-14 ENCOUNTER — Encounter: Payer: Self-pay | Admitting: Adult Health

## 2022-01-14 NOTE — Telephone Encounter (Signed)
Please advise 

## 2022-01-19 ENCOUNTER — Encounter (HOSPITAL_BASED_OUTPATIENT_CLINIC_OR_DEPARTMENT_OTHER): Payer: Self-pay | Admitting: Physical Therapy

## 2022-01-19 ENCOUNTER — Other Ambulatory Visit: Payer: Self-pay

## 2022-01-19 ENCOUNTER — Ambulatory Visit (HOSPITAL_BASED_OUTPATIENT_CLINIC_OR_DEPARTMENT_OTHER): Payer: Medicare HMO | Attending: Rheumatology | Admitting: Physical Therapy

## 2022-01-19 DIAGNOSIS — R262 Difficulty in walking, not elsewhere classified: Secondary | ICD-10-CM | POA: Diagnosis present

## 2022-01-19 DIAGNOSIS — M6281 Muscle weakness (generalized): Secondary | ICD-10-CM | POA: Diagnosis present

## 2022-01-19 DIAGNOSIS — M5136 Other intervertebral disc degeneration, lumbar region: Secondary | ICD-10-CM | POA: Diagnosis not present

## 2022-01-19 DIAGNOSIS — M545 Low back pain, unspecified: Secondary | ICD-10-CM | POA: Diagnosis present

## 2022-01-19 NOTE — Therapy (Signed)
OUTPATIENT PHYSICAL THERAPY THORACOLUMBAR EVALUATION   Patient Name: Jean Nash MRN: [id number] DOB:June 05, 1941, 81 y.o., female Today's Date: 01/19/2022   PT End of Session - 01/19/22 1441     Visit Number 1    Number of Visits 21    Authorization Type [employer] MCR    PT Start Time 0350    PT Stop Time 0938    PT Time Calculation (min) 40 min    Activity Tolerance Patient tolerated treatment well;Patient limited by pain    Behavior During Therapy [facility] for tasks assessed/performed             Past Medical History:  Diagnosis Date   Adenomatous colon polyp    Anxiety    PHOBIAS   Arthritis    Breast cancer ([place]) 08/08/13   right LOQ   Cataract    Chronic insomnia    Cluster headaches    history of migraines / NONE FOR SEVERAL YRS   Depression    Diverticulosis    Fatty liver 2011   Fibromyalgia    GERD (gastroesophageal reflux disease)    H/O hiatal hernia    History of transfusion 08/30/2013   Hx of radiation therapy 10/29/13- 12/14/13   right chest wall 5040 cGy 28 sessions, right supraclavicular/axillary region 5040 cGy 28 sessions, right chest wall boost 1000 cGy 5 sessions   Hypothyroidism    Internal hemorrhoids    Irritable bowel syndrome    Kidney stone    Lymphedema    RT ARM - WEARS SLEEVE   Macular degeneration    hole/right eye   MDD (major depressive disorder)    Osteopenia    Other abnormal glucose    Other and unspecified hyperlipidemia    Pain in joint, shoulder region    Pneumonia 1829,9371   Sleep apnea    USES C-PAP   Stress incontinence, female    Past Surgical History:  Procedure Laterality Date   ABDOMINAL HYSTERECTOMY     APPENDECTOMY     BILATERAL TOTAL MASTECTOMY WITH AXILLARY LYMPH NODE DISSECTION  08/30/2013   Dr Barry Dienes   BREAST CYST ASPIRATION     9 cysts   CATARACT EXTRACTION, BILATERAL  2005/2007   CHOLECYSTECTOMY     COLONOSCOPY     CYSTOCELE REPAIR     EVACUATION BREAST HEMATOMA Left 08/31/2013   Procedure:  EVACUATION HEMATOMA BREAST;  Surgeon: Stark Klein, MD;  Location: [place];  Service: General;  Laterality: Left;   EYE SURGERY     to repair macular hole   FOOT ARTHROPLASTY     lt    GANGLION CYST EXCISION     rt foot   HEMORRHOID SURGERY     03/1993   JOINT REPLACEMENT  03/15/11   left knee replacement   KNEE ARTHROSCOPY     /partial knee 2016/left knee 2012   MASS EXCISION  11/04/2011   Procedure: EXCISION MASS;  Surgeon: Cammie Sickle., MD;  Location: [place];  Service: Orthopedics;  Laterality: Right;  excisional biopsy right ulna mass   MASTECTOMY W/ SENTINEL NODE BIOPSY Right 08/30/2013   Procedure: RIGHT  AXILLARY SENTINEL LYMPH NODE BIOPSY; Right Axillary Node Disection;  Surgeon: Stark Klein, MD;  Location: [place];  Service: General;  Laterality: Right;  Right side nuc med 7:00    PARTIAL KNEE ARTHROPLASTY Right 11/03/2015   Procedure: RIGHT KNEE MEDIAL UNICOMPARTMENTAL ARTHROPLASTY ;  Surgeon: Gaynelle Arabian, MD;  [facility];  Service: Orthopedics;  Laterality: Right;  RECTOCELE REPAIR     SIMPLE MASTECTOMY WITH AXILLARY SENTINEL NODE BIOPSY Left 08/30/2013   Procedure: Bilateral Breast Mastectomy ;  Surgeon: Stark Klein, MD;  Location: [place];  Service: General;  Laterality: Left;   skin tags removed     breast, panty line, neckline   TOE SURGERY     preventative crossover toe surg/right foot   TOE SURGERY  2009   left foot/screw  in 2nd toe   TONSILLECTOMY     UPPER GASTROINTESTINAL ENDOSCOPY     Patient Active Problem List   Diagnosis Date Noted   DDD (degenerative disc disease), cervical 12/29/2021   Genetic testing 08/05/2020   Fibromyalgia    MDD (major depressive disorder), recurrent, in full remission ([place]) 12/10/2019   Pulmonary fibrosis ([place]) 12/04/2018   MDD (major depressive disorder), recurrent episode, mild ([place]) 12/22/2017   Osteoporosis 07/14/2017   OA (osteoarthritis) of knee 11/03/2015   Osteopenia 08/08/2015   Headache  02/03/2015   Atypical chest pain 07/15/2014   Arthralgia 02/22/2014   Psoriasis 02/22/2014   Malignant neoplasm of lower-outer quadrant of right breast of female, estrogen receptor positive ([place]) 08/09/2013   Neck pain 06/22/2013   Left knee pain 11/29/2012   Reactive depression (situational) 04/26/2012   Right wrist pain 02/02/2012   Right foot pain 10/04/2011   Nevus 06/05/2011   Status post total knee replacement 04/06/2011   Overactive bladder 03/14/2011   KNEE PAIN, LEFT 08/31/2010   HEARING LOSS 08/17/2010   HIRSUTISM 06/16/2009   CYST, IDIOPATHIC 05/20/2008   IRRITABLE BOWEL SYNDROME 04/15/2008   Allergic asthma, mild intermittent, uncomplicated 57/11/7791   GERD 03/19/2008   COLONIC POLYPS, HX OF 03/19/2008   NEPHROLITHIASIS, HX OF 03/19/2008   Hypothyroidism 03/18/2008   Hyperlipidemia 03/18/2008   INSOMNIA, CHRONIC 03/18/2008   FIBROMYALGIA 03/18/2008   Prediabetes 03/18/2008    PCP: [name], NP  REFERRING PROVIDER: Bo Merino, MD  REFERRING DIAG: M51.36 (ICD-10-CM) - DDD (degenerative disc disease), lumbar  THERAPY DIAG:  Pain, lumbar region  Muscle weakness (generalized)  Difficulty walking  ONSET DATE: 2009  SUBJECTIVE:                                                                                                                                                                                           SUBJECTIVE STATEMENT: Pt is well known to therapy with previous episodes of PT for back pain.    Pt states that the back pain is worse since last time she started therapy. She states she got two MRIs of the neck and lumbar. She has DDD, spinal stenosis, as well as arthritis. Pt states  she will occasionally have feeling of "water running legs." Pt states that she is very limited with her ADL. She is unable to perform normal/cooking cleaning. She has severe pain that limits her ability to sit, standing, driving, lifting, etc. Pt spends majority  of the day sitting. She is no longer able to walk as far. She feels that her stamina has decreased.  Her and her husband have a pool at home that she will use. She was not able to use it last year due to elbow and shoulder pain.   PERTINENT HISTORY:  See PMH, significant history with systemic symptoms  PAIN:  Are you having pain? No NPRS scale: 0/10   PRECAUTIONS: None  WEIGHT BEARING RESTRICTIONS No  FALLS:  Has patient fallen in last 6 months? No, Number of falls: 0  LIVING ENVIRONMENT: Lives with: lives with their family and lives with their spouse Lives in: House/apartment, ramped entrance Has following equipment at home: Single point cane  OCCUPATION: retired  PLOF: Independent with basic ADLs  PATIENT GOALS : relieve pain and build aquatic HEP.    OBJECTIVE:   DIAGNOSTIC FINDINGS:   L1-L2: Mild broad-based disc bulge. No foraminal or central canal stenosis.   L2-L3: Mild broad-based disc bulge. Mild bilateral facet arthropathy with ligamentum flavum infolding. Bilateral lateral recess stenosis. Mild spinal stenosis. Moderate bilateral foraminal stenosis.   L3-L4: Broad-based disc bulge. Severe bilateral facet arthropathy. Moderate spinal stenosis. Bilateral subarticular recess stenosis. Moderate bilateral foraminal stenosis.   L4-L5: Mild broad-based disc bulge. Severe bilateral facet arthropathy. Right lateral recess stenosis. No foraminal or central canal stenosis.   L5-S1: No significant disc bulge. No neural foraminal stenosis. No central canal stenosis.   IMPRESSION: 1. Diffuse lumbar spine spondylosis as described above. 2. No acute osseous injury of the lumbar spine.  IMPRESSION: 1. Generalized cervical spine degeneration with reversal of cervical lordosis. 2. Foraminal stenosis at C4-5 to C6-7, as above. 3. Diffusely patent spinal canal. 4. 4 mm nodule in the left posterior canal at the level of C4. The nodule is closely associated with the  dorsal root, favoring nerve sheath. Recommend imaging follow-up, preferably with contrast.   PATIENT SURVEYS:  FOTO 39 46 at D/C  9 pts MCII  SCREENING FOR RED FLAGS: Bowel or bladder incontinence: Yes: very mild, wears a pad in case of leakage at baseline Spinal tumors: No Cauda equina syndrome: No Compression fracture: No   COGNITION:  Overall cognitive status: Within functional limits for tasks assessed     SENSATION:  Light touch: Appears intact   POSTURE:  Fwd flex, kyphotic, lack of lumbar lordosis  PALPATION: No midline tenderness  LUMBARAROM/PROM  A/PROM A/PROM  01/19/2022  Flexion 75%  Extension 60%  Right lateral flexion 75%  Left lateral flexion 75%  Right rotation 75%  Left rotation 75%   (Blank rows = not tested)  LE AROM/PROM: moderately limited in all planes at the hip; L knee limited into flexion and extension by pain; significant crepitus with AROM at L knee  LE MMT:  MMT Right 01/19/2022 Left 01/19/2022  Hip flexion 4/5 4/5  Hip extension 4/5 4/5  Hip abduction 4+/5 4+/5  Hip adduction 4+/5 4+/5  Hip internal rotation    Hip external rotation    Knee flexion 4+/5 4/5  Knee extension 4+/5 4/5  Ankle dorsiflexion    Ankle plantarflexion    Ankle inversion    Ankle eversion     (Blank rows = not tested)   FUNCTIONAL TESTS:  5 times sit to stand: 14.7s Timed up and go (TUG): 13.2s  GAIT: Distance walked: 60f Assistive device utilized: Single point cane with and without Level of assistance: Modified independence Comments: pain without use of SPC, required VC for sequencing on level ground and stairs; step to pattern on stairs    TODAY'S TREATMENT   Discussion of previous HEP, re-introduction to aquatic therapy, returning to activity, self pool based exercise   PATIENT EDUCATION:  Education details: MOI, diagnosis, prognosis, anatomy, exercise progression, DOMS expectations, muscle firing,  envelope of function, HEP,  POC  Person educated: Patient and Spouse Education method: Explanation, Demonstration, Tactile cues, and Verbal cues Education comprehension: verbalized understanding, returned demonstration, verbal cues required, tactile cues required, and needs further education   HOME EXERCISE PROGRAM: [id number]- Back Pain [id number]- Rib Pain   ASSESSMENT:  CLINICAL IMPRESSION:  Patient is a 81y.o. female who was seen today for physical therapy evaluation and treatment for cc of LBP and T/S pain. Pt is well known to therapy and has been treated for similar episode in the past with recent worsening/exacerbation of pain. Pt's s/s appear consistent with history of DDD as well as T/S pain from mastectomy fascial restrictions. Pt's pain can be unpredictable given significant PMH. Plan to trial formal aquatic therapy as pt has pool available at home. Pt is generally deconditioned in addition to pain. Pt to see neurosurgeon soon for injections. Plan to continue with therapy following injection. Pt would benefit from continued skilled therapy in order to reach goals and maximize functional truncal and lumbopelvic strength and ROM for prevention of further functional decline.    OBJECTIVE IMPAIRMENTS Abnormal gait, decreased activity tolerance, decreased coordination, decreased endurance, decreased mobility, difficulty walking, decreased ROM, decreased strength, hypomobility, increased fascial restrictions, increased muscle spasms, impaired flexibility, impaired UE functional use, improper body mechanics, postural dysfunction, and pain.   ACTIVITY LIMITATIONS cleaning, community activity, driving, meal prep, yard work, shopping, and sShip broker.   PERSONAL FACTORS Age, Behavior pattern, Fitness, Past/current experiences, Time since onset of injury/illness/exacerbation, and 3+ comorbidities:    are also affecting patient's functional outcome.    REHAB POTENTIAL: Fair    CLINICAL DECISION MAKING:  Evolving/moderate complexity  EVALUATION COMPLEXITY: Moderate   GOALS:   SHORT TERM GOALS:  Pt will become independent with HEP in order to demonstrate synthesis of PT education.   Target date: 03/02/2022 Goal status: INITIAL  2.  Pt will report at least 2 pt reduction on NPRS scale for pain in order to demonstrate functional improvement with household activity, self care, and ADL.   Target date: 03/02/2022 Goal status: INITIAL  3.  Pt will score at least 9 pt increase on FOTO to demonstrate functional improvement in MCII and pt perceived function.    Target date: 03/02/2022 Goal status: INITIAL  LONG TERM GOALS:  Pt  will become independent with final HEP in order to demonstrate synthesis of PT education.   Target date: 04/13/2022 Goal status: INITIAL  2.  Pt will score >/= 46 on FOTO to demonstrate improvement in perceived lumbar function.   Target date: 04/13/2022 Goal status: INITIAL  3.  Pt will be able to perform 5XSTS in under 12s  in order to demonstrate functional improvement above the cut off score for adults.   Target date: 04/13/2022 Goal status: INITIAL  4.  Pt will be able to demonstrate TUG in under 10 sec in order to demonstrate functional improvement in LE function, strength, balance, and mobility for safety  with community ambulation.   Target date: 04/13/2022 Goal status: INITIAL  PLAN: PT FREQUENCY: 1-2x/week  PT DURATION: 12 weeks (likely D/C by 10 wks)  PLANNED INTERVENTIONS: Therapeutic exercises, Therapeutic activity, Neuromuscular re-education, Balance training, Gait training, Patient/Family education, Joint manipulation, Joint mobilization, Stair training, DME instructions, Aquatic Therapy, Dry Needling, Electrical stimulation, Spinal manipulation, Spinal mobilization, Cryotherapy, Moist heat, scar mobilization, Taping, Vasopneumatic device, Traction, Ultrasound, Ionotophoresis '4mg'$ /ml Dexamethasone, and Manual therapy  PLAN FOR NEXT SESSION:  aquatics intro, begin to create formal aquatic HEP, lumbopelvic strength, general mobility   Daleen Bo, PT 01/19/2022, 6:13 PM

## 2022-01-27 ENCOUNTER — Other Ambulatory Visit: Payer: Self-pay | Admitting: Adult Health

## 2022-01-27 DIAGNOSIS — Z8669 Personal history of other diseases of the nervous system and sense organs: Secondary | ICD-10-CM

## 2022-01-27 MED ORDER — BUTALBITAL-APAP-CAFFEINE 50-325-40 MG PO TABS: 1.0000 | ORAL_TABLET | ORAL | 2 refills | Status: DC | PRN

## 2022-02-02 ENCOUNTER — Ambulatory Visit (HOSPITAL_BASED_OUTPATIENT_CLINIC_OR_DEPARTMENT_OTHER): Payer: Medicare HMO | Admitting: Physical Therapy

## 2022-02-03 ENCOUNTER — Other Ambulatory Visit: Payer: Self-pay | Admitting: Adult Health

## 2022-02-03 ENCOUNTER — Encounter: Payer: Self-pay | Admitting: Adult Health

## 2022-02-03 DIAGNOSIS — M797 Fibromyalgia: Secondary | ICD-10-CM

## 2022-02-03 DIAGNOSIS — R0789 Other chest pain: Secondary | ICD-10-CM

## 2022-02-03 MED ORDER — PREGABALIN 150 MG PO CAPS: 150.0000 mg | ORAL_CAPSULE | Freq: Every day | ORAL | 0 refills | Status: DC

## 2022-02-04 ENCOUNTER — Other Ambulatory Visit: Payer: Self-pay | Admitting: Adult Health

## 2022-02-04 ENCOUNTER — Encounter: Payer: Self-pay | Admitting: Adult Health

## 2022-02-04 DIAGNOSIS — K219 Gastro-esophageal reflux disease without esophagitis: Secondary | ICD-10-CM

## 2022-02-04 NOTE — Telephone Encounter (Signed)
Verbal permission received from patient to speak to husband.  ? ?Discussed that since what pt is requesting is not documented in EMR, will have to wait for PCP to refill at dose & frequency that is being requested. Husband is not happy with that response as he states he had a conversation with Tommi Rumps during [place] visit last week. Explained that medication will have to go to a different provider for review. Verb understanding. ?

## 2022-02-05 ENCOUNTER — Other Ambulatory Visit: Payer: Self-pay | Admitting: Adult Health

## 2022-02-05 DIAGNOSIS — R0789 Other chest pain: Secondary | ICD-10-CM

## 2022-02-05 DIAGNOSIS — M797 Fibromyalgia: Secondary | ICD-10-CM

## 2022-02-05 MED ORDER — PREGABALIN 150 MG PO CAPS: 150.0000 mg | ORAL_CAPSULE | Freq: Two times a day (BID) | ORAL | 0 refills | Status: DC

## 2022-02-08 ENCOUNTER — Encounter: Payer: Self-pay | Admitting: Adult Health

## 2022-02-09 ENCOUNTER — Other Ambulatory Visit: Payer: Self-pay | Admitting: Adult Health

## 2022-02-09 ENCOUNTER — Encounter: Payer: Self-pay | Admitting: Adult Health

## 2022-02-09 DIAGNOSIS — K219 Gastro-esophageal reflux disease without esophagitis: Secondary | ICD-10-CM

## 2022-02-09 MED ORDER — ESOMEPRAZOLE MAGNESIUM 40 MG PO CPDR: 40.0000 mg | DELAYED_RELEASE_CAPSULE | Freq: Every day | ORAL | 0 refills | Status: DC

## 2022-02-11 ENCOUNTER — Encounter: Payer: Self-pay | Admitting: Adult Health

## 2022-02-16 ENCOUNTER — Ambulatory Visit (INDEPENDENT_AMBULATORY_CARE_PROVIDER_SITE_OTHER): Payer: Medicare HMO | Admitting: Adult Health

## 2022-02-16 ENCOUNTER — Encounter: Payer: Self-pay | Admitting: Adult Health

## 2022-02-16 VITALS — BP 140/90 | HR 84 | Temp 98.4°F | Ht <= 58 in | Wt 176.0 lb

## 2022-02-16 DIAGNOSIS — E7439 Other disorders of intestinal carbohydrate absorption: Secondary | ICD-10-CM | POA: Diagnosis not present

## 2022-02-16 DIAGNOSIS — R052 Subacute cough: Secondary | ICD-10-CM

## 2022-02-16 MED ORDER — MONTELUKAST SODIUM 10 MG PO TABS: 10.0000 mg | ORAL_TABLET | Freq: Every day | ORAL | 0 refills | Status: DC

## 2022-02-16 NOTE — Progress Notes (Signed)
? ?Subjective:  ? ? [name], female    DOB: [birth year], 81 y.o.   MRN: [id number] ? ?HPI ?81 year old female who  has a past medical history of Adenomatous colon polyp, Anxiety, Arthritis, Breast cancer ([place]) (08/08/13), Cataract, Chronic insomnia, Cluster headaches, Depression, Diverticulosis, Fatty liver (2011), Fibromyalgia, GERD (gastroesophageal reflux disease), H/O hiatal hernia, History of transfusion (08/30/2013), radiation therapy (10/29/13- 12/14/13), Hypothyroidism, Internal hemorrhoids, Irritable bowel syndrome, Kidney stone, Lymphedema, Macular degeneration, MDD (major depressive disorder), Osteopenia, Other abnormal glucose, Other and unspecified hyperlipidemia, Pain in joint, shoulder region, Pneumonia ([id number]), Sleep apnea, and Stress incontinence, female. ? ?She presents to the office today for concern of elevated blood sugars. She reports that she has been checking her blood sugar at home periodically with her husbands glucose meter and has had fasting readings in the 120's  ? ?Wt Readings from Last 3 Encounters:  ?02/16/22 176 lb (79.8 kg)  ?12/29/21 176 lb 3.2 oz (79.9 kg)  ?12/03/21 176 lb (79.8 kg)  ? ?He also has had a chronic dry cough for quite some time.  Has been seen by GI and pulmonary.  Some days it is worse than others.  No fevers or chills ? ?Review of Systems ?See HPI  ? ?Past Medical History:  ?Diagnosis Date  ? Adenomatous colon polyp   ? Anxiety   ? PHOBIAS  ? Arthritis   ? Breast cancer ([place]) 08/08/13  ? right LOQ  ? Cataract   ? Chronic insomnia   ? Cluster headaches   ? history of migraines / NONE FOR SEVERAL YRS  ? Depression   ? Diverticulosis   ? Fatty liver 2011  ? Fibromyalgia   ? GERD (gastroesophageal reflux disease)   ? H/O hiatal hernia   ? History of transfusion 08/30/2013  ? Hx of radiation therapy 10/29/13- 12/14/13  ? right chest wall 5040 cGy 28 sessions, right supraclavicular/axillary region 5040 cGy 28 sessions, right chest wall boost 1000 cGy 5  sessions  ? Hypothyroidism   ? Internal hemorrhoids   ? Irritable bowel syndrome   ? Kidney stone   ? Lymphedema   ? RT ARM - WEARS SLEEVE  ? Macular degeneration   ? hole/right eye  ? MDD (major depressive disorder)   ? Osteopenia   ? Other abnormal glucose   ? Other and unspecified hyperlipidemia   ? Pain in joint, shoulder region   ? Pneumonia 3009,2330  ? Sleep apnea   ? USES C-PAP  ? Stress incontinence, female   ? ? ?Social History  ? ?Socioeconomic History  ? Marital status: Married  ?  Spouse name: Fritz Pickerel  ? Number of children: 3  ? Years of education: Not on file  ? Highest education level: Not on file  ?Occupational History  ? Occupation: retired bookkeeper  ?Tobacco Use  ? Smoking status: Former  ?  Packs/day: 0.10  ?  Years: 2.00  ?  Pack years: 0.20  ?  Types: Cigarettes  ?  Start date: 11/16/1959  ?  Quit date: 11/15/1960  ?  Years since quitting: 61.2  ? Smokeless tobacco: Never  ?Vaping Use  ? Vaping Use: Never used  ?Substance and Sexual Activity  ? Alcohol use: Yes  ?  Comment: occ  ? Drug use: No  ? Sexual activity: Not on file  ?  Comment: menarche age 6, fist live birth 56, P 3, hysterectomy age 68, no HRT, BCP 2 yrs  ?Other Topics Concern  ? Not  on file  ?Social History Narrative  ? Occupation:  Retired Radiation protection practitioner   ? Married with 3 grown children     ? Never Smoked    ? Alcohol use-no       ? ?Social Determinants of Health  ? ?Financial Resource Strain: Low Risk   ? Difficulty of Paying Living Expenses: Not hard at all  ?Food Insecurity: No Food Insecurity  ? Worried About Charity fundraiser in the Last Year: Never true  ? Ran Out of Food in the Last Year: Never true  ?Transportation Needs: No Transportation Needs  ? Lack of Transportation (Medical): No  ? Lack of Transportation (Non-Medical): No  ?Physical Activity: Inactive  ? Days of Exercise per Week: 0 days  ? Minutes of Exercise per Session: 30 min  ?Stress: Stress Concern Present  ? Feeling of Stress : To some extent  ?Social Connections:  Socially Integrated  ? Frequency of Communication with Friends and Family: More than three times a week  ? Frequency of Social Gatherings with Friends and Family: Twice a week  ? Attends Religious Services: More than 4 times per year  ? Active Member of Clubs or Organizations: Yes  ? Attends Archivist Meetings: More than 4 times per year  ? Marital Status: Married  ?Intimate Partner Violence: Not At Risk  ? Fear of Current or Ex-Partner: No  ? Emotionally Abused: No  ? Physically Abused: No  ? Sexually Abused: No  ? ? ?Past Surgical History:  ?Procedure Laterality Date  ? ABDOMINAL HYSTERECTOMY    ? APPENDECTOMY    ? BILATERAL TOTAL MASTECTOMY WITH AXILLARY LYMPH NODE DISSECTION  08/30/2013  ? Dr Barry Dienes  ? BREAST CYST ASPIRATION    ? 9 cysts  ? CATARACT EXTRACTION, BILATERAL  2005/2007  ? CHOLECYSTECTOMY    ? COLONOSCOPY    ? CYSTOCELE REPAIR    ? EVACUATION BREAST HEMATOMA Left 08/31/2013  ? Procedure: EVACUATION HEMATOMA BREAST;  Surgeon: Stark Klein, MD;  Location: [place];  Service: General;  Laterality: Left;  ? EYE SURGERY    ? to repair macular hole  ? FOOT ARTHROPLASTY    ? lt   ? GANGLION CYST EXCISION    ? rt foot  ? HEMORRHOID SURGERY    ? 03/1993  ? JOINT REPLACEMENT  03/15/11  ? left knee replacement  ? KNEE ARTHROSCOPY    ? /partial knee 2016/left knee 2012  ? MASS EXCISION  11/04/2011  ? Procedure: EXCISION MASS;  Surgeon: Cammie Sickle., MD;  Location: [place];  Service: Orthopedics;  Laterality: Right;  excisional biopsy right ulna mass  ? MASTECTOMY W/ SENTINEL NODE BIOPSY Right 08/30/2013  ? Procedure: RIGHT  AXILLARY SENTINEL LYMPH NODE BIOPSY; Right Axillary Node Disection;  Surgeon: Stark Klein, MD;  Location: [place];  Service: General;  Laterality: Right;  Right side nuc med 7:00   ? PARTIAL KNEE ARTHROPLASTY Right 11/03/2015  ? Procedure: RIGHT KNEE MEDIAL UNICOMPARTMENTAL ARTHROPLASTY ;  Surgeon: Gaynelle Arabian, MD;  [facility];  Service: Orthopedics;   Laterality: Right;  ? RECTOCELE REPAIR    ? SIMPLE MASTECTOMY WITH AXILLARY SENTINEL NODE BIOPSY Left 08/30/2013  ? Procedure: Bilateral Breast Mastectomy ;  Surgeon: Stark Klein, MD;  Location: [place];  Service: General;  Laterality: Left;  ? skin tags removed    ? breast, panty line, neckline  ? TOE SURGERY    ? preventative crossover toe surg/right foot  ? TOE SURGERY  2009  ? left foot/screw  in 2nd toe  ? TONSILLECTOMY    ? UPPER GASTROINTESTINAL ENDOSCOPY    ? ? ?Family History  ?Problem Relation Age of Onset  ? Stroke Mother   ?     died age 53  ? Diabetes Mother   ? Breast cancer Mother 38  ? Breast cancer Sister 73  ? Breast cancer Paternal Aunt 20  ? Diabetes Maternal Grandfather   ? Breast cancer Paternal Grandmother 53  ? Breast cancer Paternal Aunt   ?     dx in her 30s  ? Cancer Maternal Grandmother   ?     intra-abdominal cancer  ? Brain cancer Maternal Uncle 8  ? Brain cancer Cousin 28  ?     maternal cousin  ? Brain cancer Cousin 4  ?     paternal cousin  ? Colon cancer Neg Hx   ? ? ?No Known Allergies ? ?Current Outpatient Medications on File Prior to Visit  ?Medication Sig Dispense Refill  ? Bacillus Coagulans-Inulin (PROBIOTIC) 1-250 BILLION-MG CAPS Take by mouth.    ? butalbital-acetaminophen-caffeine (FIORICET) 50-325-40 MG tablet Take 1 tablet by mouth as needed for headache. 30 tablet 2  ? Calcium Carbonate-Vitamin D (CALCIUM-VITAMIN D3 PO) Take 1 tablet by mouth in the morning and at bedtime.    ? Cholecalciferol (VITAMIN D3) 50 MCG (2000 UT) TABS Take 2 tablets by mouth daily.    ? cyclobenzaprine (FLEXERIL) 10 MG tablet Take 1 tablet (10 mg total) by mouth 3 (three) times daily as needed for muscle spasms. 30 tablet 0  ? diazepam (VALIUM) 5 MG tablet Take 5 mg by mouth every 6 (six) hours as needed for anxiety. As needed (flying & dental)    ? esomeprazole (NEXIUM) 40 MG capsule Take 1 capsule (40 mg total) by mouth daily at 12 noon. (Patient taking differently: Take 40 mg by mouth daily  before breakfast.) 365 capsule 0  ? famotidine (PEPCID) 40 MG tablet TAKE ONE TABLET BY MOUTH EVERY NIGHT AT BEDTIME 90 tablet 1  ? HYDROcodone bit-homatropine (HYCODAN) 5-1.5 MG/5ML syrup Take 5 mLs b

## 2022-02-17 LAB — POCT GLYCOSYLATED HEMOGLOBIN (HGB A1C): Hemoglobin A1C: 5.8 % — AB (ref 4.0–5.6)

## 2022-02-18 ENCOUNTER — Ambulatory Visit (HOSPITAL_BASED_OUTPATIENT_CLINIC_OR_DEPARTMENT_OTHER): Payer: Self-pay | Admitting: Physical Therapy

## 2022-02-22 ENCOUNTER — Ambulatory Visit (HOSPITAL_BASED_OUTPATIENT_CLINIC_OR_DEPARTMENT_OTHER): Payer: Self-pay | Admitting: Physical Therapy

## 2022-02-23 ENCOUNTER — Encounter: Payer: Self-pay | Admitting: Adult Health

## 2022-02-24 ENCOUNTER — Ambulatory Visit (INDEPENDENT_AMBULATORY_CARE_PROVIDER_SITE_OTHER): Payer: Medicare HMO | Admitting: Family Medicine

## 2022-02-24 ENCOUNTER — Encounter: Payer: Self-pay | Admitting: Adult Health

## 2022-02-24 VITALS — BP 141/76 | HR 65 | Temp 98.8°F | Wt 175.8 lb

## 2022-02-24 DIAGNOSIS — M79641 Pain in right hand: Secondary | ICD-10-CM

## 2022-02-24 DIAGNOSIS — R600 Localized edema: Secondary | ICD-10-CM

## 2022-02-24 MED ORDER — PREDNISONE 10 MG PO TABS: ORAL_TABLET | ORAL | 0 refills | Status: DC

## 2022-02-24 NOTE — Telephone Encounter (Signed)
Appt scheduled. Pt notified.

## 2022-02-24 NOTE — Progress Notes (Signed)
Subjective:  ? ? [name], female    DOB: 1941-04-20, 81 y.o.   MRN: [id number] ? ?Chief Complaint  ?Patient presents with  ? Hand Pain  ?  Hand pain, was in bed crying last night from pain. Used voltaren and it did not help. Right hand index side, started yesterday, with swelling.   ?Patient accompanied by her husband. ? ?HPI ?Patient is a 81 yo female with pmh was seen today for acute concern. Pt with R hand pain.  States lateral R thumb and index finger hurt so bad/were so swollen that she stayed up all night due to the constant pain.  Pt denies erythema and increased warmth to area.  Patient notes some improvement in symptoms this morning.  Patient is an avid Insurance account manager.  denies h/o gout, RA.  Unable to take NSAIDs 2/2 history of bleeding ulcer. ? ?Past Medical History:  ?Diagnosis Date  ? Adenomatous colon polyp   ? Anxiety   ? PHOBIAS  ? Arthritis   ? Breast cancer ([place]) 08/08/13  ? right LOQ  ? Cataract   ? Chronic insomnia   ? Cluster headaches   ? history of migraines / NONE FOR SEVERAL YRS  ? Depression   ? Diverticulosis   ? Fatty liver 2011  ? Fibromyalgia   ? GERD (gastroesophageal reflux disease)   ? H/O hiatal hernia   ? History of transfusion 08/30/2013  ? Hx of radiation therapy 10/29/13- 12/14/13  ? right chest wall 5040 cGy 28 sessions, right supraclavicular/axillary region 5040 cGy 28 sessions, right chest wall boost 1000 cGy 5 sessions  ? Hypothyroidism   ? Internal hemorrhoids   ? Irritable bowel syndrome   ? Kidney stone   ? Lymphedema   ? RT ARM - WEARS SLEEVE  ? Macular degeneration   ? hole/right eye  ? MDD (major depressive disorder)   ? Osteopenia   ? Other abnormal glucose   ? Other and unspecified hyperlipidemia   ? Pain in joint, shoulder region   ? Pneumonia 4098,1191  ? Sleep apnea   ? USES C-PAP  ? Stress incontinence, female   ? ? ?No Known Allergies ? ?ROS ?General: Denies fever, chills, night sweats, changes in weight, changes in appetite ?HEENT: Denies headaches, ear  pain, changes in vision, rhinorrhea, sore throat ?CV: Denies CP, palpitations, SOB, orthopnea ?Pulm: Denies SOB, cough, wheezing ?GI: Denies abdominal pain, nausea, vomiting, diarrhea, constipation ?GU: Denies dysuria, hematuria, frequency, vaginal discharge ?Msk: Denies muscle cramps, joint pains  + right hand pain and edema ?Neuro: Denies weakness, numbness, tingling ?Skin: Denies rashes, bruising ?Psych: Denies depression, anxiety, hallucinations ?   ?Objective:  ?  ?Blood pressure (!) 141/76, pulse 65, temperature 98.8 ?F (37.1 ?C), temperature source Oral, weight 175 lb 12.8 oz (79.7 kg), SpO2 96 %. ? ?Gen. Pleasant, well-nourished, in no distress, normal affect   ?HEENT: [place]/AT, face symmetric, conjunctiva clear, no scleral icterus, PERRLA, EOMI, nares patent without drainage  ?Lungs: no accessory muscle use, CTAB, no wheezes or rales ?Cardiovascular: RRR, no m/r/g, no peripheral edema ?Musculoskeletal: No deformities, no cyanosis or clubbing, normal tone ?Neuro:  A&Ox3, CN II-XII intact, normal gait ?Skin:  Warm, no lesions/ rash ? ?Wt Readings from Last 3 Encounters:  ?02/24/22 175 lb 12.8 oz (79.7 kg)  ?02/16/22 176 lb (79.8 kg)  ?12/29/21 176 lb 3.2 oz (79.9 kg)  ? ? ?Lab Results  ?Component Value Date  ? WBC 7.7 06/25/2021  ? HGB 15.2 (H) 06/25/2021  ?  HCT 45.3 06/25/2021  ? PLT 189.0 06/25/2021  ? GLUCOSE 122 (H) 03/06/2021  ? CHOL 209 (H) 03/06/2021  ? TRIG 101.0 03/06/2021  ? HDL 51.10 03/06/2021  ? LDLDIRECT 113.0 05/17/2016  ? LDLCALC 138 (H) 03/06/2021  ? ALT 27 03/06/2021  ? AST 17 03/06/2021  ? NA 140 03/06/2021  ? K 4.6 03/06/2021  ? CL 104 03/06/2021  ? CREATININE 0.75 03/06/2021  ? BUN 17 03/06/2021  ? CO2 27 03/06/2021  ? TSH 2.24 03/06/2021  ? INR 0.99 10/28/2015  ? HGBA1C 5.8 (A) 02/16/2022  ? MICROALBUR <0.7 05/17/2016  ? ? ?Assessment/Plan: ? ?Pain in right hand ?-Discussed possible causes including OA, gout, RA. ?-Symptoms likely 2/2 OA given history of frequent repetitive hand motions  with knitting and daily activity. ?-Per chart review x-ray right hand from 05/31/2017 with changes consistent with prior distal radial fracture with healing and remodeling.  Degenerative changes of the first [facility] joint also seen.  X-ray of left hand with OA in multiple joints on 01/03/2019. ?-We will start prednisone taper ?-Discussed symptom management with heat, ice, massage, topical analgesics. ?-Consider repeating x-ray of right hand given recent increase in symptoms. ? - Plan: predniSONE (DELTASONE) 10 MG tablet ? ?Edema of hand  ?- Plan: predniSONE (DELTASONE) 10 MG tablet ? ?F/u as needed with PCP. ? ?Grier Mitts, MD ?

## 2022-02-26 ENCOUNTER — Encounter: Payer: Self-pay | Admitting: Adult Health

## 2022-02-26 ENCOUNTER — Other Ambulatory Visit: Payer: Self-pay

## 2022-02-26 ENCOUNTER — Ambulatory Visit (HOSPITAL_BASED_OUTPATIENT_CLINIC_OR_DEPARTMENT_OTHER): Payer: Self-pay | Admitting: Physical Therapy

## 2022-02-26 DIAGNOSIS — K921 Melena: Secondary | ICD-10-CM

## 2022-02-26 NOTE — Telephone Encounter (Signed)
Pt has been scheduled.  °

## 2022-02-26 NOTE — Telephone Encounter (Signed)
Jean Park, MD ?Prior records reviewed.  Please repeat CBC and iron/ferritin panel now and arrange a follow-up visit with Dr. Fuller Plan or the first available APP. In the meantime, given her prior evaluation and reassuring blood work, she should review her lethargy with her primary care doctor. Many different things can cause lethargy and she needs to have a full evaluation.  ?

## 2022-02-26 NOTE — Telephone Encounter (Signed)
Called pt and spoke with pt and pt's husband. Gave pt and pt's husband the recommendations from Dr. Tarri Glenn. F/u scheduled for 4/20 at 1:30 with Ellouise Newer. Lab orders entered. Pt and pt's husband verbalized understanding and had no other concerns at end of call.  ?

## 2022-03-01 ENCOUNTER — Ambulatory Visit (HOSPITAL_BASED_OUTPATIENT_CLINIC_OR_DEPARTMENT_OTHER): Payer: Self-pay | Admitting: Physical Therapy

## 2022-03-01 ENCOUNTER — Other Ambulatory Visit: Payer: Self-pay | Admitting: Adult Health

## 2022-03-01 ENCOUNTER — Encounter: Payer: Self-pay | Admitting: Family Medicine

## 2022-03-02 ENCOUNTER — Ambulatory Visit: Payer: Medicare HMO | Admitting: Adult Health

## 2022-03-02 ENCOUNTER — Encounter: Payer: Self-pay | Admitting: Adult Health

## 2022-03-03 ENCOUNTER — Encounter: Payer: Self-pay | Admitting: Adult Health

## 2022-03-03 ENCOUNTER — Ambulatory Visit: Payer: Medicare HMO | Admitting: Adult Health

## 2022-03-03 ENCOUNTER — Ambulatory Visit (INDEPENDENT_AMBULATORY_CARE_PROVIDER_SITE_OTHER): Payer: Medicare HMO | Admitting: Adult Health

## 2022-03-03 VITALS — BP 130/80 | HR 85 | Temp 98.5°F | Ht <= 58 in | Wt 168.0 lb

## 2022-03-03 DIAGNOSIS — R5383 Other fatigue: Secondary | ICD-10-CM | POA: Diagnosis not present

## 2022-03-03 NOTE — Progress Notes (Signed)
? ?Subjective:  ? ? [name], female    DOB: September 23, 1941, 81 y.o.   MRN: [id number] ? ?HPI ? ?81 year old female who  has a past medical history of Adenomatous colon polyp, Anxiety, Arthritis, Breast cancer ([place]) (08/08/13), Cataract, Chronic insomnia, Cluster headaches, Depression, Diverticulosis, Fatty liver (2011), Fibromyalgia, GERD (gastroesophageal reflux disease), H/O hiatal hernia, History of transfusion (08/30/2013), radiation therapy (10/29/13- 12/14/13), Hypothyroidism, Internal hemorrhoids, Irritable bowel syndrome, Kidney stone, Lymphedema, Macular degeneration, MDD (major depressive disorder), Osteopenia, Other abnormal glucose, Other and unspecified hyperlipidemia, Pain in joint, shoulder region, Pneumonia (0175,1025), Sleep apnea, and Stress incontinence, female. ? ?She presents to the office today with her husband for sudden onset fatigue. ? ?She reports that 1 week ago she started to feel not well and then the next day she could not get out of bed because she was so lethargic.  The other symptom has been decreased appetite.  She reports that starting Wednesday through the weekend and into early this week she was not able to eat anything because of decreased appetite ? ?Today she was able to have small amounts of food and this morning she had eggs and bacon and for lunch she was able to eat a few pieces of [place] breast.  This is her typical breakfast and lunch.  Over the last 24 hours she has started to feel improved.  Her husband states "she is at 1000% better but still feels extremely fatigued and lethargic. ? ?She does report ongoing intermittent episodes of a small amount of dark black stool.  She has history of gastric ulcer.  Being seen by GI tomorrow. ? ?She denies fevers, chills, nausea, vomiting, abdominal pain, UTI-like symptoms, upper respiratory infection symptoms, or any other symptoms. ? ?Wt Readings from Last 3 Encounters:  ?03/03/22 168 lb (76.2 kg)  ?02/24/22 175 lb  12.8 oz (79.7 kg)  ?02/16/22 176 lb (79.8 kg)  ? ?Review of Systems ?See HPI  ? ?Past Medical History:  ?Diagnosis Date  ? Adenomatous colon polyp   ? Anxiety   ? PHOBIAS  ? Arthritis   ? Breast cancer ([place]) 08/08/13  ? right LOQ  ? Cataract   ? Chronic insomnia   ? Cluster headaches   ? history of migraines / NONE FOR SEVERAL YRS  ? Depression   ? Diverticulosis   ? Fatty liver 2011  ? Fibromyalgia   ? GERD (gastroesophageal reflux disease)   ? H/O hiatal hernia   ? History of transfusion 08/30/2013  ? Hx of radiation therapy 10/29/13- 12/14/13  ? right chest wall 5040 cGy 28 sessions, right supraclavicular/axillary region 5040 cGy 28 sessions, right chest wall boost 1000 cGy 5 sessions  ? Hypothyroidism   ? Internal hemorrhoids   ? Irritable bowel syndrome   ? Kidney stone   ? Lymphedema   ? RT ARM - WEARS SLEEVE  ? Macular degeneration   ? hole/right eye  ? MDD (major depressive disorder)   ? Osteopenia   ? Other abnormal glucose   ? Other and unspecified hyperlipidemia   ? Pain in joint, shoulder region   ? Pneumonia 8527,7824  ? Sleep apnea   ? USES C-PAP  ? Stress incontinence, female   ? ? ?Social History  ? ?Socioeconomic History  ? Marital status: Married  ?  Spouse name: Fritz Pickerel  ? Number of children: 3  ? Years of education: Not on file  ? Highest education level: Not on file  ?Occupational History  ?  Occupation: retired bookkeeper  ?Tobacco Use  ? Smoking status: Former  ?  Packs/day: 0.10  ?  Years: 2.00  ?  Pack years: 0.20  ?  Types: Cigarettes  ?  Start date: 11/16/1959  ?  Quit date: 11/15/1960  ?  Years since quitting: 61.3  ? Smokeless tobacco: Never  ?Vaping Use  ? Vaping Use: Never used  ?Substance and Sexual Activity  ? Alcohol use: Yes  ?  Comment: occ  ? Drug use: No  ? Sexual activity: Not on file  ?  Comment: menarche age 20, fist live birth 73, P 3, hysterectomy age 68, no HRT, BCP 2 yrs  ?Other Topics Concern  ? Not on file  ?Social History Narrative  ? Occupation:  Retired Radiation protection practitioner   ? Married  with 3 grown children     ? Never Smoked    ? Alcohol use-no       ? ?Social Determinants of Health  ? ?Financial Resource Strain: Low Risk   ? Difficulty of Paying Living Expenses: Not hard at all  ?Food Insecurity: No Food Insecurity  ? Worried About Charity fundraiser in the Last Year: Never true  ? Ran Out of Food in the Last Year: Never true  ?Transportation Needs: No Transportation Needs  ? Lack of Transportation (Medical): No  ? Lack of Transportation (Non-Medical): No  ?Physical Activity: Inactive  ? Days of Exercise per Week: 0 days  ? Minutes of Exercise per Session: 30 min  ?Stress: Stress Concern Present  ? Feeling of Stress : To some extent  ?Social Connections: Socially Integrated  ? Frequency of Communication with Friends and Family: More than three times a week  ? Frequency of Social Gatherings with Friends and Family: Twice a week  ? Attends Religious Services: More than 4 times per year  ? Active Member of Clubs or Organizations: Yes  ? Attends Archivist Meetings: More than 4 times per year  ? Marital Status: Married  ?Intimate Partner Violence: Not At Risk  ? Fear of Current or Ex-Partner: No  ? Emotionally Abused: No  ? Physically Abused: No  ? Sexually Abused: No  ? ? ?Past Surgical History:  ?Procedure Laterality Date  ? ABDOMINAL HYSTERECTOMY    ? APPENDECTOMY    ? BILATERAL TOTAL MASTECTOMY WITH AXILLARY LYMPH NODE DISSECTION  08/30/2013  ? Dr Barry Dienes  ? BREAST CYST ASPIRATION    ? 9 cysts  ? CATARACT EXTRACTION, BILATERAL  2005/2007  ? CHOLECYSTECTOMY    ? COLONOSCOPY    ? CYSTOCELE REPAIR    ? EVACUATION BREAST HEMATOMA Left 08/31/2013  ? Procedure: EVACUATION HEMATOMA BREAST;  Surgeon: Stark Klein, MD;  Location: [place];  Service: General;  Laterality: Left;  ? EYE SURGERY    ? to repair macular hole  ? FOOT ARTHROPLASTY    ? lt   ? GANGLION CYST EXCISION    ? rt foot  ? HEMORRHOID SURGERY    ? 03/1993  ? JOINT REPLACEMENT  03/15/11  ? left knee replacement  ? KNEE ARTHROSCOPY     ? /partial knee 2016/left knee 2012  ? MASS EXCISION  11/04/2011  ? Procedure: EXCISION MASS;  Surgeon: Cammie Sickle., MD;  Location: [place];  Service: Orthopedics;  Laterality: Right;  excisional biopsy right ulna mass  ? MASTECTOMY W/ SENTINEL NODE BIOPSY Right 08/30/2013  ? Procedure: RIGHT  AXILLARY SENTINEL LYMPH NODE BIOPSY; Right Axillary Node Disection;  Surgeon:  Stark Klein, MD;  Location: [place];  Service: General;  Laterality: Right;  Right side nuc med 7:00   ? PARTIAL KNEE ARTHROPLASTY Right 11/03/2015  ? Procedure: RIGHT KNEE MEDIAL UNICOMPARTMENTAL ARTHROPLASTY ;  Surgeon: Gaynelle Arabian, MD;  [facility];  Service: Orthopedics;  Laterality: Right;  ? RECTOCELE REPAIR    ? SIMPLE MASTECTOMY WITH AXILLARY SENTINEL NODE BIOPSY Left 08/30/2013  ? Procedure: Bilateral Breast Mastectomy ;  Surgeon: Stark Klein, MD;  Location: [place];  Service: General;  Laterality: Left;  ? skin tags removed    ? breast, panty line, neckline  ? TOE SURGERY    ? preventative crossover toe surg/right foot  ? TOE SURGERY  2009  ? left foot/screw  in 2nd toe  ? TONSILLECTOMY    ? UPPER GASTROINTESTINAL ENDOSCOPY    ? ? ?Family History  ?Problem Relation Age of Onset  ? Stroke Mother   ?     died age 60  ? Diabetes Mother   ? Breast cancer Mother 27  ? Breast cancer Sister 35  ? Breast cancer Paternal Aunt 64  ? Diabetes Maternal Grandfather   ? Breast cancer Paternal Grandmother 52  ? Breast cancer Paternal Aunt   ?     dx in her 32s  ? Cancer Maternal Grandmother   ?     intra-abdominal cancer  ? Brain cancer Maternal Uncle 8  ? Brain cancer Cousin 62  ?     maternal cousin  ? Brain cancer Cousin 40  ?     paternal cousin  ? Colon cancer Neg Hx   ? ? ?No Known Allergies ? ?Current Outpatient Medications on File Prior to Visit  ?Medication Sig Dispense Refill  ? Bacillus Coagulans-Inulin (PROBIOTIC) 1-250 BILLION-MG CAPS Take by mouth.    ? butalbital-acetaminophen-caffeine (FIORICET) 50-325-40 MG  tablet Take 1 tablet by mouth as needed for headache. 30 tablet 2  ? Calcium Carbonate-Vitamin D (CALCIUM-VITAMIN D3 PO) Take 1 tablet by mouth in the morning and at bedtime.    ? Cholecalciferol (VI

## 2022-03-03 NOTE — Telephone Encounter (Signed)
Please advise 

## 2022-03-04 ENCOUNTER — Telehealth: Payer: Self-pay | Admitting: Adult Health

## 2022-03-04 ENCOUNTER — Encounter: Payer: Self-pay | Admitting: Physician Assistant

## 2022-03-04 ENCOUNTER — Other Ambulatory Visit: Payer: Self-pay | Admitting: Adult Health

## 2022-03-04 ENCOUNTER — Other Ambulatory Visit: Payer: Medicare HMO

## 2022-03-04 ENCOUNTER — Ambulatory Visit (INDEPENDENT_AMBULATORY_CARE_PROVIDER_SITE_OTHER): Payer: Medicare HMO | Admitting: Physician Assistant

## 2022-03-04 ENCOUNTER — Encounter: Payer: Self-pay | Admitting: Adult Health

## 2022-03-04 ENCOUNTER — Ambulatory Visit (HOSPITAL_BASED_OUTPATIENT_CLINIC_OR_DEPARTMENT_OTHER): Payer: Self-pay | Admitting: Physical Therapy

## 2022-03-04 VITALS — BP 120/70 | HR 72 | Ht 59.0 in | Wt 170.6 lb

## 2022-03-04 DIAGNOSIS — R195 Other fecal abnormalities: Secondary | ICD-10-CM | POA: Diagnosis not present

## 2022-03-04 DIAGNOSIS — N3001 Acute cystitis with hematuria: Secondary | ICD-10-CM

## 2022-03-04 LAB — CBC WITH DIFFERENTIAL/PLATELET
Basophils Absolute: 0 10*3/uL (ref 0.0–0.1)
Basophils Relative: 0.4 % (ref 0.0–3.0)
Eosinophils Absolute: 0 10*3/uL (ref 0.0–0.7)
Eosinophils Relative: 0.1 % (ref 0.0–5.0)
HCT: 48 % — ABNORMAL HIGH (ref 36.0–46.0)
Hemoglobin: 16.2 g/dL — ABNORMAL HIGH (ref 12.0–15.0)
Lymphocytes Relative: 13.4 % (ref 12.0–46.0)
Lymphs Abs: 1.4 10*3/uL (ref 0.7–4.0)
MCHC: 33.7 g/dL (ref 30.0–36.0)
MCV: 89.5 fl (ref 78.0–100.0)
Monocytes Absolute: 0.4 10*3/uL (ref 0.1–1.0)
Monocytes Relative: 3.4 % (ref 3.0–12.0)
Neutro Abs: 8.9 10*3/uL — ABNORMAL HIGH (ref 1.4–7.7)
Neutrophils Relative %: 82.7 % — ABNORMAL HIGH (ref 43.0–77.0)
Platelets: 229 10*3/uL (ref 150.0–400.0)
RBC: 5.37 Mil/uL — ABNORMAL HIGH (ref 3.87–5.11)
RDW: 14.4 % (ref 11.5–15.5)
WBC: 10.8 10*3/uL — ABNORMAL HIGH (ref 4.0–10.5)

## 2022-03-04 LAB — COMPREHENSIVE METABOLIC PANEL
ALT: 21 U/L (ref 0–35)
AST: 16 U/L (ref 0–37)
Albumin: 4.5 g/dL (ref 3.5–5.2)
Alkaline Phosphatase: 65 U/L (ref 39–117)
BUN: 23 mg/dL (ref 6–23)
CO2: 23 mEq/L (ref 19–32)
Calcium: 9.3 mg/dL (ref 8.4–10.5)
Chloride: 103 mEq/L (ref 96–112)
Creatinine, Ser: 0.97 mg/dL (ref 0.40–1.20)
GFR: 55.82 mL/min — ABNORMAL LOW (ref >60.00)
Glucose, Bld: 136 mg/dL — ABNORMAL HIGH (ref 70–99)
Potassium: 4.3 mEq/L (ref 3.5–5.1)
Sodium: 137 mEq/L (ref 135–145)
Total Bilirubin: 0.7 mg/dL (ref 0.2–1.2)
Total Protein: 7.1 g/dL (ref 6.0–8.3)

## 2022-03-04 LAB — IBC + FERRITIN
Ferritin: 210.2 ng/mL (ref 10.0–291.0)
Iron: 51 ug/dL (ref 42–145)
Saturation Ratios: 16.1 % — ABNORMAL LOW (ref 20.0–50.0)
TIBC: 316.4 ug/dL (ref 250.0–450.0)
Transferrin: 226 mg/dL (ref 212.0–360.0)

## 2022-03-04 LAB — URINALYSIS, ROUTINE W REFLEX MICROSCOPIC
Bilirubin Urine: NEGATIVE
Hgb urine dipstick: NEGATIVE
Ketones, ur: 40 — AB
Nitrite: POSITIVE — AB
RBC / HPF: NONE SEEN (ref 0–?)
Specific Gravity, Urine: 1.015 (ref 1.000–1.030)
Total Protein, Urine: NEGATIVE
Urine Glucose: NEGATIVE
Urobilinogen, UA: 1 (ref 0.0–1.0)
pH: 7 (ref 5.0–8.0)

## 2022-03-04 LAB — VITAMIN D 25 HYDROXY (VIT D DEFICIENCY, FRACTURES): VITD: 75.01 ng/mL (ref 30.00–100.00)

## 2022-03-04 LAB — VITAMIN B12: Vitamin B-12: >1504 pg/mL — ABNORMAL HIGH (ref 211–911)

## 2022-03-04 LAB — TSH: TSH: 0.62 u[IU]/mL (ref 0.35–5.50)

## 2022-03-04 MED ORDER — NITROFURANTOIN MONOHYD MACRO 100 MG PO CAPS: 100.0000 mg | ORAL_CAPSULE | Freq: Two times a day (BID) | ORAL | 0 refills | Status: AC

## 2022-03-04 NOTE — Patient Instructions (Signed)
If you are age 79 or older, your body mass index should be between 23-30. Your Body mass index is 34.46 kg/m?Marland Kitchen If this is out of the aforementioned range listed, please consider follow up with your Primary Care Provider. ? ?If you are age 49 or younger, your body mass index should be between 19-25. Your Body mass index is 34.46 kg/m?Marland Kitchen If this is out of the aformentioned range listed, please consider follow up with your Primary Care Provider.  ? ?________________________________________________________ ? ?The  GI providers would like to encourage you to use [facility] to communicate with providers for non-urgent requests or questions.  Due to long hold times on the telephone, sending your provider a message by [facility] may be a faster and more efficient way to get a response.  Please allow 48 business hours for a response.  Please remember that this is for non-urgent requests.  ?_______________________________________________________ ? ?It was a pleasure to see you today! ? ?Thank you for trusting me with your gastrointestinal care!   ? ? ?

## 2022-03-04 NOTE — Telephone Encounter (Signed)
Updated patient on her labs.  White count was slightly elevated at 10.8 and she does have a urinary tract infection.  Fortunately, unable to add on culture at this time. ? ?He is going to gastroenterology later on today, will place order for culture ? ?Looking back at her last urine culture in 2021 she was resistant to multiple antibiotics.  We will place her on Macrobid for the time being ?

## 2022-03-04 NOTE — Progress Notes (Signed)
? ?Chief Complaint: Black stools and fatigue ? ?HPI: ?   Jean Nash is a 81 year old female, known to Dr. Fuller Plan, with a past medical history as listed below including fibromyalgia, reflux and hiatal hernia, who was referred to me by [name], NP for a complaint of black stools. ?   06/26/2021 patient seen in clinic by Tye Savoy, NP for a finding of a black substance in her stool over the past few weeks.  At time of exam there was no evidence of GI bleeding.  At that time thought she was supposed to be on Carafate, but this has been discontinued at time of her EGD in 2021 which showed a healing 5 mm gastric ulcer (biopsies negative for H. pylori).  Her hemoglobin was 15 on labs drawn the day before and BUN was normal.  On DRE there was a scant amount of light brown stool that was heme-negative.  Patient was told to avoid NSAIDs. ?   02/25/2022 patient called and described continuing as some black spots in her stool off and on.  At that time Dr. Tarri Glenn who is the doc of day ordered repeat CBC and iron/ferritin panel.  In the meantime she was encouraged to discuss her lethargy with her primary care doctor. ?   03/03/2022 CBC with a hemoglobin of 16.2.  Normal MCV.  White count was minimally elevated at 10.8.  B12 was greater than 1504.  Vitamin D normal.  Iron studies did show a decreased iron saturation at 16.1 and other labs were normal.  Urinalysis was also done and positive for UTI. ?   Today, the patient presents to clinic accompanied by her husband who assists with history.  Tells me that she is still seeing some black specks in her stools here and there may be once a month ever since being seen last year.  She was recently seen by her PCP as above and diagnosed with a UTI which is thought to have led to her fatigue and just feeling "out of it" recently.  They are working on getting a urine culture for her so they can treat this appropriately.  Patient tells me as far as her GI system nothing has  changed.  The black specks have not grown in frequency and are not her entire stool.  They discussed recent labs which were normal.  She continues on Esomeprazole 40 mg daily.  Husband does tell me that she takes a vitamin pill for her eyes and it is black.  He wonders if it is just pieces of this that she sees. ?   Also continues taking half a dose of MiraLAX once a day.  Her husband tells me that occasionally she will have looser stools, but in general they are fairly controlled with some radiation back and forth. ?   Denies fever, chills, weight loss or blood in her stool. ? ?Past Medical History:  ?Diagnosis Date  ? Adenomatous colon polyp   ? Anxiety   ? PHOBIAS  ? Arthritis   ? Breast cancer ([place]) 08/08/13  ? right LOQ  ? Cataract   ? Chronic insomnia   ? Cluster headaches   ? history of migraines / NONE FOR SEVERAL YRS  ? Depression   ? Diverticulosis   ? Fatty liver 2011  ? Fibromyalgia   ? GERD (gastroesophageal reflux disease)   ? H/O hiatal hernia   ? History of transfusion 08/30/2013  ? Hx of radiation therapy 10/29/13- 12/14/13  ? right chest  wall 5040 cGy 28 sessions, right supraclavicular/axillary region 5040 cGy 28 sessions, right chest wall boost 1000 cGy 5 sessions  ? Hypothyroidism   ? Internal hemorrhoids   ? Irritable bowel syndrome   ? Kidney stone   ? Lymphedema   ? RT ARM - WEARS SLEEVE  ? Macular degeneration   ? hole/right eye  ? MDD (major depressive disorder)   ? Osteopenia   ? Other abnormal glucose   ? Other and unspecified hyperlipidemia   ? Pain in joint, shoulder region   ? Pneumonia 4010,2725  ? Sleep apnea   ? USES C-PAP  ? Stress incontinence, female   ? ? ?Past Surgical History:  ?Procedure Laterality Date  ? ABDOMINAL HYSTERECTOMY    ? APPENDECTOMY    ? BILATERAL TOTAL MASTECTOMY WITH AXILLARY LYMPH NODE DISSECTION  08/30/2013  ? Dr Barry Dienes  ? BREAST CYST ASPIRATION    ? 9 cysts  ? CATARACT EXTRACTION, BILATERAL  2005/2007  ? CHOLECYSTECTOMY    ? COLONOSCOPY    ? CYSTOCELE REPAIR     ? EVACUATION BREAST HEMATOMA Left 08/31/2013  ? Procedure: EVACUATION HEMATOMA BREAST;  Surgeon: Stark Klein, MD;  Location: [place];  Service: General;  Laterality: Left;  ? EYE SURGERY    ? to repair macular hole  ? FOOT ARTHROPLASTY    ? lt   ? GANGLION CYST EXCISION    ? rt foot  ? HEMORRHOID SURGERY    ? 03/1993  ? JOINT REPLACEMENT  03/15/11  ? left knee replacement  ? KNEE ARTHROSCOPY    ? /partial knee 2016/left knee 2012  ? MASS EXCISION  11/04/2011  ? Procedure: EXCISION MASS;  Surgeon: Cammie Sickle., MD;  Location: [place];  Service: Orthopedics;  Laterality: Right;  excisional biopsy right ulna mass  ? MASTECTOMY W/ SENTINEL NODE BIOPSY Right 08/30/2013  ? Procedure: RIGHT  AXILLARY SENTINEL LYMPH NODE BIOPSY; Right Axillary Node Disection;  Surgeon: Stark Klein, MD;  Location: [place];  Service: General;  Laterality: Right;  Right side nuc med 7:00   ? PARTIAL KNEE ARTHROPLASTY Right 11/03/2015  ? Procedure: RIGHT KNEE MEDIAL UNICOMPARTMENTAL ARTHROPLASTY ;  Surgeon: Gaynelle Arabian, MD;  [facility];  Service: Orthopedics;  Laterality: Right;  ? RECTOCELE REPAIR    ? SIMPLE MASTECTOMY WITH AXILLARY SENTINEL NODE BIOPSY Left 08/30/2013  ? Procedure: Bilateral Breast Mastectomy ;  Surgeon: Stark Klein, MD;  Location: [place];  Service: General;  Laterality: Left;  ? skin tags removed    ? breast, panty line, neckline  ? TOE SURGERY    ? preventative crossover toe surg/right foot  ? TOE SURGERY  2009  ? left foot/screw  in 2nd toe  ? TONSILLECTOMY    ? UPPER GASTROINTESTINAL ENDOSCOPY    ? ? ?Current Outpatient Medications  ?Medication Sig Dispense Refill  ? Bacillus Coagulans-Inulin (PROBIOTIC) 1-250 BILLION-MG CAPS Take by mouth.    ? butalbital-acetaminophen-caffeine (FIORICET) 50-325-40 MG tablet Take 1 tablet by mouth as needed for headache. 30 tablet 2  ? Calcium Carbonate-Vitamin D (CALCIUM-VITAMIN D3 PO) Take 1 tablet by mouth in the morning and at bedtime.    ?  Cholecalciferol (VITAMIN D3) 50 MCG (2000 UT) TABS Take 2 tablets by mouth daily.    ? cyclobenzaprine (FLEXERIL) 10 MG tablet Take 1 tablet (10 mg total) by mouth 3 (three) times daily as needed for muscle spasms. 30 tablet 0  ? diazepam (VALIUM) 5 MG tablet Take 5 mg  by mouth every 6 (six) hours as needed for anxiety. As needed (flying & dental)    ? esomeprazole (NEXIUM) 40 MG capsule Take 1 capsule (40 mg total) by mouth daily at 12 noon. (Patient taking differently: Take 40 mg by mouth daily before breakfast.) 365 capsule 0  ? famotidine (PEPCID) 40 MG tablet TAKE ONE TABLET BY MOUTH EVERY NIGHT AT BEDTIME 90 tablet 1  ? HYDROcodone bit-homatropine (HYCODAN) 5-1.5 MG/5ML syrup Take 5 mLs by mouth every 8 (eight) hours as needed for cough. 120 mL 0  ? levothyroxine (SYNTHROID) 100 MCG tablet TAKE ONE TABLET BY MOUTH DAILY 365 tablet 0  ? MELATONIN PO Take 1 tablet by mouth daily.    ? montelukast (SINGULAIR) 10 MG tablet Take 1 tablet (10 mg total) by mouth at bedtime. 90 tablet 0  ? nitrofurantoin, macrocrystal-monohydrate, (MACROBID) 100 MG capsule Take 1 capsule (100 mg total) by mouth 2 (two) times daily for 5 days. 10 capsule 0  ? omega-3 acid ethyl esters (LOVAZA) 1 g capsule TAKE ONE CAPSULE BY MOUTH TWICE A DAY 180 capsule 3  ? oxyCODONE-acetaminophen (PERCOCET) 5-325 MG tablet Take 0.5-1 tablets by mouth every 6 (six) hours as needed for severe pain (may cause constipation; do not take concurrently with tramadol or Fioricet). 10 tablet 0  ? Polyethylene Glycol 3350 (MIRALAX PO) Take by mouth. 1/2 capful every morning    ? predniSONE (DELTASONE) 10 MG tablet Take 4 tabs every morning for 3 days, 3 tabs for 2 days, 2 tabs for 2 days, 1 tab for 1 day. 23 tablet 0  ? pregabalin (LYRICA) 150 MG capsule Take 1 capsule (150 mg total) by mouth 2 (two) times daily. 180 capsule 0  ? simvastatin (ZOCOR) 20 MG tablet TAKE ONE TABLET BY MOUTH EVERY NIGHT AT BEDTIME 365 tablet 0  ? topiramate (TOPAMAX) 25 MG tablet  TAKE ONE TABLET BY MOUTH DAILY 325 tablet 0  ? traMADol (ULTRAM) 50 MG tablet Take 50 mg by mouth every 6 (six) hours as needed.    ? venlafaxine XR (EFFEXOR-XR) 37.5 MG 24 hr capsule TAKE ONE CAPSULE BY MOUTH DAIL

## 2022-03-05 NOTE — Telephone Encounter (Signed)
FYI

## 2022-03-07 LAB — URINE CULTURE
MICRO NUMBER:: 13290445
SPECIMEN QUALITY:: ADEQUATE

## 2022-03-09 ENCOUNTER — Ambulatory Visit (HOSPITAL_BASED_OUTPATIENT_CLINIC_OR_DEPARTMENT_OTHER): Payer: Self-pay | Admitting: Physical Therapy

## 2022-03-10 ENCOUNTER — Encounter: Payer: Medicare HMO | Admitting: Adult Health

## 2022-03-11 ENCOUNTER — Encounter: Payer: Self-pay | Admitting: Adult Health

## 2022-03-12 ENCOUNTER — Ambulatory Visit (HOSPITAL_BASED_OUTPATIENT_CLINIC_OR_DEPARTMENT_OTHER): Payer: Self-pay | Admitting: Physical Therapy

## 2022-03-12 NOTE — Telephone Encounter (Signed)
FYI

## 2022-03-12 NOTE — Telephone Encounter (Signed)
Please advise 

## 2022-03-15 ENCOUNTER — Encounter (HOSPITAL_BASED_OUTPATIENT_CLINIC_OR_DEPARTMENT_OTHER): Payer: Self-pay

## 2022-03-15 ENCOUNTER — Encounter: Payer: Self-pay | Admitting: Adult Health

## 2022-03-15 ENCOUNTER — Emergency Department (HOSPITAL_BASED_OUTPATIENT_CLINIC_OR_DEPARTMENT_OTHER)
Admission: EM | Admit: 2022-03-15 | Discharge: 2022-03-15 | Disposition: A | Discharge status: Home / Self Care | Payer: Medicare HMO | Attending: Emergency Medicine | Admitting: Emergency Medicine

## 2022-03-15 ENCOUNTER — Other Ambulatory Visit: Payer: Self-pay

## 2022-03-15 DIAGNOSIS — D72829 Elevated white blood cell count, unspecified: Secondary | ICD-10-CM | POA: Insufficient documentation

## 2022-03-15 DIAGNOSIS — R110 Nausea: Secondary | ICD-10-CM | POA: Diagnosis not present

## 2022-03-15 DIAGNOSIS — R531 Weakness: Secondary | ICD-10-CM | POA: Diagnosis not present

## 2022-03-15 DIAGNOSIS — Z20822 Contact with and (suspected) exposure to covid-19: Secondary | ICD-10-CM | POA: Diagnosis not present

## 2022-03-15 DIAGNOSIS — R5383 Other fatigue: Secondary | ICD-10-CM | POA: Insufficient documentation

## 2022-03-15 LAB — RESP PANEL BY RT-PCR (FLU A&B, COVID) ARPGX2
Influenza A by PCR: NEGATIVE
Influenza B by PCR: NEGATIVE
SARS Coronavirus 2 by RT PCR: NEGATIVE

## 2022-03-15 LAB — CBC WITH DIFFERENTIAL/PLATELET
Abs Immature Granulocytes: 0.05 10*3/uL (ref 0.00–0.07)
Basophils Absolute: 0 10*3/uL (ref 0.0–0.1)
Basophils Relative: 0 %
Eosinophils Absolute: 0.1 10*3/uL (ref 0.0–0.5)
Eosinophils Relative: 1 %
HCT: 49.8 % — ABNORMAL HIGH (ref 36.0–46.0)
Hemoglobin: 16.1 g/dL — ABNORMAL HIGH (ref 12.0–15.0)
Immature Granulocytes: 0 %
Lymphocytes Relative: 17 %
Lymphs Abs: 2.1 10*3/uL (ref 0.7–4.0)
MCH: 29 pg (ref 26.0–34.0)
MCHC: 32.3 g/dL (ref 30.0–36.0)
MCV: 89.6 fL (ref 80.0–100.0)
Monocytes Absolute: 0.8 10*3/uL (ref 0.1–1.0)
Monocytes Relative: 7 %
Neutro Abs: 9.4 10*3/uL — ABNORMAL HIGH (ref 1.7–7.7)
Neutrophils Relative %: 75 %
Platelets: 213 10*3/uL (ref 150–400)
RBC: 5.56 MIL/uL — ABNORMAL HIGH (ref 3.87–5.11)
RDW: 14.1 % (ref 11.5–15.5)
WBC: 12.5 10*3/uL — ABNORMAL HIGH (ref 4.0–10.5)
nRBC: 0 % (ref 0.0–0.2)

## 2022-03-15 LAB — URINALYSIS, ROUTINE W REFLEX MICROSCOPIC
Bilirubin Urine: NEGATIVE
Glucose, UA: NEGATIVE mg/dL
Hgb urine dipstick: NEGATIVE
Ketones, ur: 40 mg/dL — AB
Leukocytes,Ua: NEGATIVE
Nitrite: NEGATIVE
Specific Gravity, Urine: 1.025 (ref 1.005–1.030)
pH: 6 (ref 5.0–8.0)

## 2022-03-15 LAB — COMPREHENSIVE METABOLIC PANEL
ALT: 25 U/L (ref 0–44)
AST: 18 U/L (ref 15–41)
Albumin: 4.9 g/dL (ref 3.5–5.0)
Alkaline Phosphatase: 56 U/L (ref 38–126)
Anion gap: 11 (ref 5–15)
BUN: 13 mg/dL (ref 8–23)
CO2: 24 mmol/L (ref 22–32)
Calcium: 10 mg/dL (ref 8.9–10.3)
Chloride: 105 mmol/L (ref 98–111)
Creatinine, Ser: 0.7 mg/dL (ref 0.44–1.00)
GFR, Estimated: >60 mL/min (ref >60)
Glucose, Bld: 117 mg/dL — ABNORMAL HIGH (ref 70–99)
Potassium: 3.7 mmol/L (ref 3.5–5.1)
Sodium: 140 mmol/L (ref 135–145)
Total Bilirubin: 1.2 mg/dL (ref 0.3–1.2)
Total Protein: 7.5 g/dL (ref 6.5–8.1)

## 2022-03-15 LAB — TROPONIN I (HIGH SENSITIVITY): Troponin I (High Sensitivity): 3 ng/L (ref <18)

## 2022-03-15 MED ORDER — SODIUM CHLORIDE 0.9 % IV BOLUS
1000.0000 mL | Freq: Once | INTRAVENOUS | Status: AC
  Administered 2022-03-15: 1000 mL via INTRAVENOUS

## 2022-03-15 NOTE — Discharge Instructions (Signed)
If your blood culture returns positive, you will receive a phone call telling you to return to the ER immediately for IV antibiotics.  Please follow-up with your primary care provider otherwise. ?

## 2022-03-15 NOTE — ED Triage Notes (Signed)
Last week pt was dx with an e-coli UTI and started on Macrobid. Pt finished the course of abx and pt has not felt any better. Pt having decreased appetite, nausea, diarrhea. Pt's husband is concerned for dehydration.  ?

## 2022-03-15 NOTE — ED Notes (Signed)
ED Provider at bedside. 

## 2022-03-15 NOTE — ED Provider Notes (Signed)
?[facility] ?Provider Note ? ? ?CSN: [id number] ?Arrival date & time: 03/15/22  1445 ? ?  ? ?History ? ?CC: Weakness ? ?Jean Nash is a 81 y.o. female w/ hx of recurring UTI's, reported bleeding gastric ulcer, presenting to the ED with nausea and fatigue.  Supplemental hx provided by patient's husband.  They report she has been lethargic, not eating much or drinking for the past 2 days, reporting she has no appetite.  Denies abdominal pain or dysuria.  No active symptoms of UTI.   ? ?Was treated recently with macrobid finished 1 week for UTI diagnosed 03/04/22, 100K E Coli ? ?Susceptibility ? ? Escherichia coli   ? URINE CULTURE, REFLEX   ? AMOX/CLAVULANIC 4  Sensitive   ? AMPICILLIN >=32  Resistant   ? AMPICILLIN/SULBACTAM 16  Intermediate   ? CEFAZOLIN <=4  Not Reportable 1   ? CEFEPIME <=1  Sensitive   ? CEFTAZIDIME <=1  Sensitive   ? CEFTRIAXONE <=1  Sensitive   ? CIPROFLOXACIN <=0.25  Sensitive   ? GENTAMICIN <=1  Sensitive   ? IMIPENEM <=0.25  Sensitive   ? LEVOFLOXACIN <=0.12  Sensitive   ? NITROFURANTOIN <=16  Sensitive   ? PIP/TAZO <=4  Sensitive   ? TOBRAMYCIN <=1  Sensitive   ? TRIMETH/SULFA <=20  Sensitive 2   ? ? ?HPI ? ?  ? ?Home Medications ?Prior to Admission medications   ?Medication Sig Start Date End Date Taking? Authorizing Provider  ?Bacillus Coagulans-Inulin (PROBIOTIC) 1-250 BILLION-MG CAPS Take by mouth.    [provider]  ?butalbital-acetaminophen-caffeine (FIORICET) [phone] MG tablet Take 1 tablet by mouth as needed for headache. 01/27/22   [name], NP  ?Calcium Carbonate-Vitamin D (CALCIUM-VITAMIN D3 PO) Take 1 tablet by mouth in the morning and at bedtime.    [provider]  ?Cholecalciferol (VITAMIN D3) 50 MCG (2000 UT) TABS Take 2 tablets by mouth daily.    [provider]  ?cyclobenzaprine (FLEXERIL) 10 MG tablet Take 1 tablet (10 mg total) by mouth 3 (three) times daily as needed for muscle spasms. 09/03/21   Nafziger,  Tommi Rumps, NP  ?diazepam (VALIUM) 5 MG tablet Take 5 mg by mouth every 6 (six) hours as needed for anxiety. As needed (flying & dental)    [provider]  ?esomeprazole (NEXIUM) 40 MG capsule Take 1 capsule (40 mg total) by mouth daily at 12 noon. ?Patient taking differently: Take 40 mg by mouth daily before breakfast. 02/09/22   Nafziger, Tommi Rumps, NP  ?famotidine (PEPCID) 40 MG tablet TAKE ONE TABLET BY MOUTH EVERY NIGHT AT BEDTIME 10/22/21   Esterwood, Amy S, PA-C  ?HYDROcodone bit-homatropine (HYCODAN) 5-1.5 MG/5ML syrup Take 5 mLs by mouth every 8 (eight) hours as needed for cough. 09/17/21   Nafziger, Tommi Rumps, NP  ?levothyroxine (SYNTHROID) 100 MCG tablet TAKE ONE TABLET BY MOUTH DAILY 05/19/21   Nafziger, Tommi Rumps, NP  ?MELATONIN PO Take 1 tablet by mouth daily.    [provider]  ?montelukast (SINGULAIR) 10 MG tablet Take 1 tablet (10 mg total) by mouth at bedtime. 02/16/22   Nafziger, Tommi Rumps, NP  ?omega-3 acid ethyl esters (LOVAZA) 1 g capsule TAKE ONE CAPSULE BY MOUTH TWICE A DAY 09/03/21   Nafziger, Tommi Rumps, NP  ?oxyCODONE-acetaminophen (PERCOCET) 5-325 MG tablet Take 0.5-1 tablets by mouth every 6 (six) hours as needed for severe pain (may cause constipation; do not take concurrently with tramadol or Fioricet). 04/24/21   Molpus, Jenny Reichmann, MD  ?Polyethylene Glycol [address] [employer]  PO) Take by mouth. 1/2 capful every morning    [provider]  ?predniSONE (DELTASONE) 10 MG tablet Take 4 tabs every morning for 3 days, 3 tabs for 2 days, 2 tabs for 2 days, 1 tab for 1 day. 02/24/22   Billie Ruddy, MD  ?pregabalin (LYRICA) 150 MG capsule Take 1 capsule (150 mg total) by mouth 2 (two) times daily. 02/05/22 05/06/22  Nafziger, Tommi Rumps, NP  ?simvastatin (ZOCOR) 20 MG tablet TAKE ONE TABLET BY MOUTH EVERY NIGHT AT BEDTIME 03/02/22   Nafziger, Tommi Rumps, NP  ?topiramate (TOPAMAX) 25 MG tablet TAKE ONE TABLET BY MOUTH DAILY 01/05/22   [name], NP  ?traMADol (ULTRAM) 50 MG tablet Take 50 mg by mouth every 6 (six) hours as  needed.    [provider]  ?venlafaxine XR (EFFEXOR-XR) 37.5 MG 24 hr capsule TAKE ONE CAPSULE BY MOUTH DAILY WITH BREAKFAST 12/08/21   [name], NP  ?   ? ?Allergies    ?Patient has no known allergies.   ? ?Review of Systems   ?Review of Systems ? ?Physical Exam ?Updated Vital Signs ?BP (!) 148/99   Pulse 76   Temp 98.1 ?F (36.7 ?C)   Resp 18   Ht '4\' 11"'$  (1.499 m)   Wt 77.1 kg   SpO2 99%   BMI 34.34 kg/m?  ?Physical Exam ?Constitutional:   ?   General: She is not in acute distress. ?HENT:  ?   Head: Normocephalic and atraumatic.  ?Eyes:  ?   Conjunctiva/sclera: Conjunctivae normal.  ?   Pupils: Pupils are equal, round, and reactive to light.  ?Cardiovascular:  ?   Rate and Rhythm: Normal rate and regular rhythm.  ?Pulmonary:  ?   Effort: Pulmonary effort is normal. No respiratory distress.  ?Abdominal:  ?   General: There is no distension.  ?   Tenderness: There is no abdominal tenderness. There is no guarding.  ?Skin: ?   General: Skin is warm and dry.  ?Neurological:  ?   General: No focal deficit present.  ?   Mental Status: She is alert. Mental status is at baseline.  ?Psychiatric:     ?   Mood and Affect: Mood normal.     ?   Behavior: Behavior normal.  ? ? ?ED Results / Procedures / Treatments   ?Labs ?(all labs ordered are listed, but only abnormal results are displayed) ?Labs Reviewed  ?URINALYSIS, ROUTINE W REFLEX MICROSCOPIC - Abnormal; Notable for the following components:  ?    Result Value  ? APPearance HAZY (*)   ? Ketones, ur 40 (*)   ? Protein, ur TRACE (*)   ? All other components within normal limits  ?COMPREHENSIVE METABOLIC PANEL - Abnormal; Notable for the following components:  ? Glucose, Bld 117 (*)   ? All other components within normal limits  ?CBC WITH DIFFERENTIAL/PLATELET - Abnormal; Notable for the following components:  ? WBC 12.5 (*)   ? RBC 5.56 (*)   ? Hemoglobin 16.1 (*)   ? HCT 49.8 (*)   ? Neutro Abs 9.4 (*)   ? All other components within normal limits   ?RESP PANEL BY RT-PCR (FLU A&B, COVID) [id number]  ?URINE CULTURE  ?CULTURE, BLOOD (SINGLE)  ?TROPONIN I (HIGH SENSITIVITY)  ? ? ?EKG ?None ? ?Radiology ?No results found. ? ?Procedures ?Procedures  ? ? ?Medications Ordered in ED ?Medications  ?sodium chloride 0.9 % bolus 1,000 mL ( Intravenous Stopped 03/15/22 1814)  ? ? ?ED Course/  Medical Decision Making/ A&P ?Clinical Course as of 03/15/22 2251  ?Mon Mar 15, 2022  ?[place] Patient reassessed and still feeling well and hemodynamically stable, afebrile.  She and her husband are wanting to go home.  We discussed her work-up, UA does not show any signs of infection, and I would hold off on further antibiotics at this time, which they agree with.  They are aware of the blood culture was sent and pending and she may need to return if her results are positive.  Her COVID test is negative.  She does not appear significantly dehydrated, but is completed her liter of fluids anyway. [MT]  ?  ?Clinical Course User Index ?[MT] Wyvonnia Dusky, MD  ? ?                        ?Medical Decision Making ?Amount and/or Complexity of Data Reviewed ?Labs: ordered. ? ? ?This patient presents to the ED with concern for weakness.. This involves an extensive number of treatment options, and is a complaint that carries with it a high risk of complications and morbidity.  The differential diagnosis includes infection vs anemia vs dehydration vs other ? ?Co-morbidities that complicate the patient evaluation: recurring MDR UTI's ? ?Additional history obtained from husband ? ?External records from outside source obtained and reviewed including most recent urine culture results.  It appears she was appropriately treated with macrobid based on sensitivities. ? ?UA here without sign of infection - negative leukocytes and nitrites ? ?Possible bleeding gastric ulcer (?) - no epigastric pain, will check hgb level, husband states they have been dealing with this for years and she takes PPI and no blood  thinners.  Last EGD June 2021 reviewed personally showing 1 cratered healing gastric ulcer. ? ?I ordered and personally interpreted labs.  The pertinent results include: Minor elevation of white blood cell count

## 2022-03-16 ENCOUNTER — Encounter: Payer: Self-pay | Admitting: Adult Health

## 2022-03-16 LAB — URINE CULTURE

## 2022-03-16 NOTE — Telephone Encounter (Signed)
FYI

## 2022-03-18 ENCOUNTER — Ambulatory Visit (INDEPENDENT_AMBULATORY_CARE_PROVIDER_SITE_OTHER): Payer: Medicare HMO

## 2022-03-18 ENCOUNTER — Ambulatory Visit (INDEPENDENT_AMBULATORY_CARE_PROVIDER_SITE_OTHER): Payer: Medicare HMO | Admitting: Adult Health

## 2022-03-18 ENCOUNTER — Encounter: Payer: Self-pay | Admitting: Adult Health

## 2022-03-18 VITALS — BP 108/68 | HR 95 | Temp 98.6°F | Ht 59.0 in | Wt 168.0 lb

## 2022-03-18 DIAGNOSIS — R531 Weakness: Secondary | ICD-10-CM

## 2022-03-18 LAB — CBC WITH DIFFERENTIAL/PLATELET
Basophils Absolute: 0.2 10*3/uL — ABNORMAL HIGH (ref 0.0–0.1)
Basophils Relative: 1.7 % (ref 0.0–3.0)
Eosinophils Absolute: 0.2 10*3/uL (ref 0.0–0.7)
Eosinophils Relative: 1.7 % (ref 0.0–5.0)
HCT: 49 % — ABNORMAL HIGH (ref 36.0–46.0)
Hemoglobin: 16 g/dL — ABNORMAL HIGH (ref 12.0–15.0)
Lymphocytes Relative: 20.7 % (ref 12.0–46.0)
Lymphs Abs: 2.2 10*3/uL (ref 0.7–4.0)
MCHC: 32.6 g/dL (ref 30.0–36.0)
MCV: 90.4 fl (ref 78.0–100.0)
Monocytes Absolute: 0.6 10*3/uL (ref 0.1–1.0)
Monocytes Relative: 6 % (ref 3.0–12.0)
Neutro Abs: 7.4 10*3/uL (ref 1.4–7.7)
Neutrophils Relative %: 69.9 % (ref 43.0–77.0)
Platelets: 170 10*3/uL (ref 150.0–400.0)
RBC: 5.42 Mil/uL — ABNORMAL HIGH (ref 3.87–5.11)
RDW: 14.5 % (ref 11.5–15.5)
WBC: 10.5 10*3/uL (ref 4.0–10.5)

## 2022-03-18 LAB — BASIC METABOLIC PANEL
BUN: 14 mg/dL (ref 6–23)
CO2: 27 mEq/L (ref 19–32)
Calcium: 9.8 mg/dL (ref 8.4–10.5)
Chloride: 104 mEq/L (ref 96–112)
Creatinine, Ser: 0.91 mg/dL (ref 0.40–1.20)
GFR: 60.24 mL/min (ref >60.00)
Glucose, Bld: 112 mg/dL — ABNORMAL HIGH (ref 70–99)
Potassium: 3.6 mEq/L (ref 3.5–5.1)
Sodium: 140 mEq/L (ref 135–145)

## 2022-03-18 NOTE — Patient Instructions (Signed)
There are no preventive care reminders to display for this patient. ? ? ? Row Labels 03/18/2022  ? 10:56 AM 12/03/2021  ?  8:39 AM 07/22/2021  ? 11:16 AM  ?Depression screen PHQ 2/9   Section Header. No data exists in this row.     ?Decreased Interest   0 0 0  ?Down, Depressed, Hopeless   0 0 0  ?PHQ - 2 Score   0 0 0  ?Altered sleeping   0 0   ?Tired, decreased energy   0 0   ?Change in appetite   0 0   ?Feeling bad or failure about yourself    0 0   ?Trouble concentrating   0 0   ?Moving slowly or fidgety/restless   0 0   ?Suicidal thoughts   0 0   ?PHQ-9 Score   0 0   ?Difficult doing work/chores   Not difficult at all Not difficult at all   ? ? ?

## 2022-03-18 NOTE — Progress Notes (Signed)
? ?Subjective:  ? ? [name], female    DOB: 1941/04/15, 81 y.o.   MRN: [id number] ? ?HPI ?81 year old female who  has a past medical history of Adenomatous colon polyp, Anxiety, Arthritis, Breast cancer ([place]) (08/08/13), Cataract, Chronic insomnia, Cluster headaches, Depression, Diverticulosis, Fatty liver (2011), Fibromyalgia, GERD (gastroesophageal reflux disease), H/O hiatal hernia, History of transfusion (08/30/2013), radiation therapy (10/29/13- 12/14/13), Hypothyroidism, Internal hemorrhoids, Irritable bowel syndrome, Kidney stone, Lymphedema, Macular degeneration, MDD (major depressive disorder), Osteopenia, Other abnormal glucose, Other and unspecified hyperlipidemia, Pain in joint, shoulder region, Pneumonia ([phone]), Sleep apnea, and Stress incontinence, female. ? ?She presents to the office for follow-up.  Seen in the emergency room 3 days ago.  She presented to the ER with nausea and fatigue.  She was recently treated by myself with Macrobid for UTI that was diagnosed on 03/04/2022. ? ?In the ER her UA showed no signs of infection, negative leukocytes and nitrates.Urine Culture with contamination.  She had mild elevation in white blood cell count at 12.5 and hemoglobin is 16.1.  Her COVID and CMP were unremarkable. ? ?He was given IV fluids for dehydration and her urine. After IV fluids her symptoms improved  ? ?Today she is with her husband.  Continues to feel extremely weak with decreased appetite.  Her husband is making her eat and drink large to stay hydrated and get nutrition. ? ?Not had any fevers, chills, shortness of breath, or chest pain ? ? ?Review of Systems ?See HPI  ? ?Past Medical History:  ?Diagnosis Date  ? Adenomatous colon polyp   ? Anxiety   ? PHOBIAS  ? Arthritis   ? Breast cancer ([place]) 08/08/13  ? right LOQ  ? Cataract   ? Chronic insomnia   ? Cluster headaches   ? history of migraines / NONE FOR SEVERAL YRS  ? Depression   ? Diverticulosis   ? Fatty liver 2011  ?  Fibromyalgia   ? GERD (gastroesophageal reflux disease)   ? H/O hiatal hernia   ? History of transfusion 08/30/2013  ? Hx of radiation therapy 10/29/13- 12/14/13  ? right chest wall 5040 cGy 28 sessions, right supraclavicular/axillary region 5040 cGy 28 sessions, right chest wall boost 1000 cGy 5 sessions  ? Hypothyroidism   ? Internal hemorrhoids   ? Irritable bowel syndrome   ? Kidney stone   ? Lymphedema   ? RT ARM - WEARS SLEEVE  ? Macular degeneration   ? hole/right eye  ? MDD (major depressive disorder)   ? Osteopenia   ? Other abnormal glucose   ? Other and unspecified hyperlipidemia   ? Pain in joint, shoulder region   ? Pneumonia [phone],0630  ? Sleep apnea   ? USES C-PAP  ? Stress incontinence, female   ? ? ?Social History  ? ?Socioeconomic History  ? Marital status: Married  ?  Spouse name: Fritz Pickerel  ? Number of children: 3  ? Years of education: Not on file  ? Highest education level: Not on file  ?Occupational History  ? Occupation: retired bookkeeper  ?Tobacco Use  ? Smoking status: Former  ?  Packs/day: 0.10  ?  Years: 2.00  ?  Pack years: 0.20  ?  Types: Cigarettes  ?  Start date: 11/16/1959  ?  Quit date: 11/15/1960  ?  Years since quitting: 61.3  ? Smokeless tobacco: Never  ?Vaping Use  ? Vaping Use: Never used  ?Substance and Sexual Activity  ? Alcohol use: Yes  ?  Comment: occ  ? Drug use: No  ? Sexual activity: Not on file  ?  Comment: menarche age 71, fist live birth 87, P 3, hysterectomy age 67, no HRT, BCP 2 yrs  ?Other Topics Concern  ? Not on file  ?Social History Narrative  ? Occupation:  Retired Radiation protection practitioner   ? Married with 3 grown children     ? Never Smoked    ? Alcohol use-no       ? ?Social Determinants of Health  ? ?Financial Resource Strain: Low Risk   ? Difficulty of Paying Living Expenses: Not hard at all  ?Food Insecurity: No Food Insecurity  ? Worried About Charity fundraiser in the Last Year: Never true  ? Ran Out of Food in the Last Year: Never true  ?Transportation Needs: No  Transportation Needs  ? Lack of Transportation (Medical): No  ? Lack of Transportation (Non-Medical): No  ?Physical Activity: Inactive  ? Days of Exercise per Week: 0 days  ? Minutes of Exercise per Session: 30 min  ?Stress: Stress Concern Present  ? Feeling of Stress : To some extent  ?Social Connections: Socially Integrated  ? Frequency of Communication with Friends and Family: More than three times a week  ? Frequency of Social Gatherings with Friends and Family: Twice a week  ? Attends Religious Services: More than 4 times per year  ? Active Member of Clubs or Organizations: Yes  ? Attends Archivist Meetings: More than 4 times per year  ? Marital Status: Married  ?Intimate Partner Violence: Not At Risk  ? Fear of Current or Ex-Partner: No  ? Emotionally Abused: No  ? Physically Abused: No  ? Sexually Abused: No  ? ? ?Past Surgical History:  ?Procedure Laterality Date  ? ABDOMINAL HYSTERECTOMY    ? APPENDECTOMY    ? BILATERAL TOTAL MASTECTOMY WITH AXILLARY LYMPH NODE DISSECTION  08/30/2013  ? Dr Barry Dienes  ? BREAST CYST ASPIRATION    ? 9 cysts  ? CATARACT EXTRACTION, BILATERAL  2005/2007  ? CHOLECYSTECTOMY    ? COLONOSCOPY    ? CYSTOCELE REPAIR    ? EVACUATION BREAST HEMATOMA Left 08/31/2013  ? Procedure: EVACUATION HEMATOMA BREAST;  Surgeon: Stark Klein, MD;  Location: [facility];  Service: General;  Laterality: Left;  ? EYE SURGERY    ? to repair macular hole  ? FOOT ARTHROPLASTY    ? lt   ? GANGLION CYST EXCISION    ? rt foot  ? HEMORRHOID SURGERY    ? 03/1993  ? JOINT REPLACEMENT  03/15/11  ? left knee replacement  ? KNEE ARTHROSCOPY    ? /partial knee 2016/left knee 2012  ? MASS EXCISION  11/04/2011  ? Procedure: EXCISION MASS;  Surgeon: Cammie Sickle., MD;  Location: [facility];  Service: Orthopedics;  Laterality: Right;  excisional biopsy right ulna mass  ? MASTECTOMY W/ SENTINEL NODE BIOPSY Right 08/30/2013  ? Procedure: RIGHT  AXILLARY SENTINEL LYMPH NODE BIOPSY; Right Axillary  Node Disection;  Surgeon: Stark Klein, MD;  Location: [facility];  Service: General;  Laterality: Right;  Right side nuc med 7:00   ? PARTIAL KNEE ARTHROPLASTY Right 11/03/2015  ? Procedure: RIGHT KNEE MEDIAL UNICOMPARTMENTAL ARTHROPLASTY ;  Surgeon: Gaynelle Arabian, MD;  Location: [facility];  Service: Orthopedics;  Laterality: Right;  ? RECTOCELE REPAIR    ? SIMPLE MASTECTOMY WITH AXILLARY SENTINEL NODE BIOPSY Left 08/30/2013  ? Procedure: Bilateral Breast Mastectomy ;  Surgeon: Dorris Fetch  Barry Dienes, MD;  Location: [facility];  Service: General;  Laterality: Left;  ? skin tags removed    ? breast, panty line, neckline  ? TOE SURGERY    ? preventative crossover toe surg/right foot  ? TOE SURGERY  2009  ? left foot/screw  in 2nd toe  ? TONSILLECTOMY    ? UPPER GASTROINTESTINAL ENDOSCOPY    ? ? ?Family History  ?Problem Relation Age of Onset  ? Stroke Mother   ?     died age 63  ? Diabetes Mother   ? Breast cancer Mother 68  ? Breast cancer Sister 71  ? Breast cancer Paternal Aunt 31  ? Diabetes Maternal Grandfather   ? Breast cancer Paternal Grandmother 4  ? Breast cancer Paternal Aunt   ?     dx in her 37s  ? Cancer Maternal Grandmother   ?     intra-abdominal cancer  ? Brain cancer Maternal Uncle 8  ? Brain cancer Cousin 58  ?     maternal cousin  ? Brain cancer Cousin 8  ?     paternal cousin  ? Colon cancer Neg Hx   ? ? ?No Known Allergies ? ?Current Outpatient Medications on File Prior to Visit  ?Medication Sig Dispense Refill  ? Bacillus Coagulans-Inulin (PROBIOTIC) 1-250 BILLION-MG CAPS Take by mouth.    ? butalbital-acetaminophen-caffeine (FIORICET) 50-325-40 MG tablet Take 1 tablet by mouth as needed for headache. 30 tablet 2  ? Calcium Carbonate-Vitamin D (CALCIUM-VITAMIN D3 PO) Take 1 tablet by mouth in the morning and at bedtime.    ? Cholecalciferol (VITAMIN D3) 50 MCG (2000 UT) TABS Take 2 tablets by mouth daily.    ? cyclobenzaprine (FLEXERIL) 10 MG tablet Take 1 tablet (10 mg total) by mouth 3 (three) times daily as  needed for muscle spasms. 30 tablet 0  ? diazepam (VALIUM) 5 MG tablet Take 5 mg by mouth every 6 (six) hours as needed for anxiety. As needed (flying & dental)    ? esomeprazole (NEXIUM) 40 MG capsule Take 1 capsule (40 m

## 2022-03-19 ENCOUNTER — Encounter: Payer: Self-pay | Admitting: Adult Health

## 2022-03-19 LAB — URINE CULTURE
MICRO NUMBER:: 13351917
SPECIMEN QUALITY:: ADEQUATE

## 2022-03-20 LAB — CULTURE, BLOOD (SINGLE)
Culture: NO GROWTH
Special Requests: ADEQUATE

## 2022-03-23 ENCOUNTER — Encounter: Payer: Self-pay | Admitting: Adult Health

## 2022-03-23 NOTE — Telephone Encounter (Signed)
FYI

## 2022-03-31 ENCOUNTER — Ambulatory Visit: Payer: Medicare HMO | Admitting: Obstetrics and Gynecology

## 2022-04-01 ENCOUNTER — Ambulatory Visit (INDEPENDENT_AMBULATORY_CARE_PROVIDER_SITE_OTHER): Payer: Medicare HMO | Admitting: Obstetrics and Gynecology

## 2022-04-01 ENCOUNTER — Encounter: Payer: Self-pay | Admitting: Obstetrics and Gynecology

## 2022-04-01 VITALS — BP 130/83 | HR 74 | Ht 59.0 in | Wt 170.0 lb

## 2022-04-01 DIAGNOSIS — N816 Rectocele: Secondary | ICD-10-CM | POA: Diagnosis not present

## 2022-04-01 DIAGNOSIS — N3281 Overactive bladder: Secondary | ICD-10-CM | POA: Diagnosis not present

## 2022-04-01 DIAGNOSIS — N393 Stress incontinence (female) (male): Secondary | ICD-10-CM

## 2022-04-01 DIAGNOSIS — R350 Frequency of micturition: Secondary | ICD-10-CM

## 2022-04-01 LAB — POCT URINALYSIS DIPSTICK
Appearance: ABNORMAL
Bilirubin, UA: NEGATIVE
Blood, UA: NEGATIVE
Glucose, UA: NEGATIVE
Ketones, UA: NEGATIVE
Leukocytes, UA: NEGATIVE
Nitrite, UA: NEGATIVE
Protein, UA: NEGATIVE
Spec Grav, UA: 1.02 (ref 1.010–1.025)
Urobilinogen, UA: 0.2 E.U./dL
pH, UA: 7 (ref 5.0–8.0)

## 2022-04-01 MED ORDER — TROSPIUM CHLORIDE ER 60 MG PO CP24: 1.0000 | ORAL_CAPSULE | Freq: Every day | ORAL | 5 refills | Status: DC

## 2022-04-01 NOTE — Progress Notes (Signed)
[facility] New Patient Evaluation and Consultation  Referring Provider: [name], NP PCP: [name], NP Date of Service: 04/01/2022  SUBJECTIVE Chief Complaint: New Patient (Initial Visit) Jean Nash is a 81 y.o. female complains of urinary incontinence./)  History of Present Illness: Jean Nash is a 81 y.o. White or Caucasian female presenting for evaluation of incontinence.    Urinary Symptoms: Leaks urine with cough/ sneeze, laughing, exercise, and going from sitting to standing. Does not feel the leaking happening at times. Does not feel SUI is an issue Leaks 11 time(s) per day. Has been happening for years.  Pad use: liners or pads - wears with Knix underwear She is bothered by her UI symptoms. Has maybe tried a medication (maybe vesicare) but cannot remember what it was.   Day time voids every 1-2 hours- has been better since the UTI.  Nocturia: 2 times per night to void. Voiding dysfunction: she does not empty her bladder well.  does not use a catheter to empty bladder.  When urinating, she feels difficulty starting urine stream Drinks: 12-16oz coffee, cranberry juice with water per day  UTIs: " a few" in the last year Reports history of kidney stones in the past  Pelvic Organ Prolapse Symptoms:                  She Denies a feeling of a bulge the vaginal area.  Has a history of prolapse repair with the hysterectomy.   Bowel Symptom: Bowel movements: 2-4 time(s) per day Stool consistency: hard, soft , or loose Straining: yes.  Splinting: no.  Incomplete evacuation: yes.  She Admits to accidental bowel leakage / fecal incontinence  Occurs: infrequently   Consistency with leakage: soft  or liquid Bowel regimen: miralax Last colonoscopy: Date 2018  Sexual Function Sexually active: yes.  Pain with sex: No  Pelvic Pain Denies pelvic pain  Past Medical History:  Past Medical History:  Diagnosis Date   Adenomatous colon polyp     Anxiety    PHOBIAS   Arthritis    Breast cancer ([place]) 08/08/13   right LOQ   Cataract    Chronic insomnia    Cluster headaches    history of migraines / NONE FOR SEVERAL YRS   Depression    Diverticulosis    Fatty liver 2011   Fibromyalgia    GERD (gastroesophageal reflux disease)    H/O hiatal hernia    History of transfusion 08/30/2013   Hx of radiation therapy 10/29/13- 12/14/13   right chest wall 5040 cGy 28 sessions, right supraclavicular/axillary region 5040 cGy 28 sessions, right chest wall boost 1000 cGy 5 sessions   Hypothyroidism    Internal hemorrhoids    Irritable bowel syndrome    Kidney stone    Lymphedema    RT ARM - WEARS SLEEVE   Macular degeneration    hole/right eye   MDD (major depressive disorder)    Osteopenia    Other abnormal glucose    Other and unspecified hyperlipidemia    Pain in joint, shoulder region    Pneumonia 1700,1749   Sleep apnea    USES C-PAP   Stress incontinence, female      Past Surgical History:   Past Surgical History:  Procedure Laterality Date   ABDOMINAL HYSTERECTOMY     APPENDECTOMY     BILATERAL TOTAL MASTECTOMY WITH AXILLARY LYMPH NODE DISSECTION  08/30/2013   Dr Barry Dienes   BREAST CYST ASPIRATION     9 cysts  CATARACT EXTRACTION, BILATERAL  2005/2007   CHOLECYSTECTOMY     COLONOSCOPY     CYSTOCELE REPAIR     EVACUATION BREAST HEMATOMA Left 08/31/2013   Procedure: EVACUATION HEMATOMA BREAST;  Surgeon: Stark Klein, MD;  Location: [facility];  Service: General;  Laterality: Left;   EYE SURGERY     to repair macular hole   FOOT ARTHROPLASTY     lt    GANGLION CYST EXCISION     rt foot   HEMORRHOID SURGERY     03/1993   JOINT REPLACEMENT  03/15/11   left knee replacement   KNEE ARTHROSCOPY     /partial knee 2016/left knee 2012   MASS EXCISION  11/04/2011   Procedure: EXCISION MASS;  Surgeon: Cammie Sickle., MD;  Location: [facility];  Service: Orthopedics;  Laterality: Right;  excisional biopsy  right ulna mass   MASTECTOMY W/ SENTINEL NODE BIOPSY Right 08/30/2013   Procedure: RIGHT  AXILLARY SENTINEL LYMPH NODE BIOPSY; Right Axillary Node Disection;  Surgeon: Stark Klein, MD;  Location: [place];  Service: General;  Laterality: Right;  Right side nuc med 7:00    PARTIAL KNEE ARTHROPLASTY Right 11/03/2015   Procedure: RIGHT KNEE MEDIAL UNICOMPARTMENTAL ARTHROPLASTY ;  Surgeon: Gaynelle Arabian, MD;  Location: [facility];  Service: Orthopedics;  Laterality: Right;   RECTOCELE REPAIR     SIMPLE MASTECTOMY WITH AXILLARY SENTINEL NODE BIOPSY Left 08/30/2013   Procedure: Bilateral Breast Mastectomy ;  Surgeon: Stark Klein, MD;  Location: [facility];  Service: General;  Laterality: Left;   skin tags removed     breast, panty line, neckline   TOE SURGERY     preventative crossover toe surg/right foot   TOE SURGERY  2009   left foot/screw  in 2nd toe   TONSILLECTOMY     UPPER GASTROINTESTINAL ENDOSCOPY       Past OB/GYN History: OB History  Gravida Para Term Preterm AB Living  '4 3     1 3  '$ SAB IAB Ectopic Multiple Live Births  1       3    # Outcome Date GA Lbr Len/2nd Weight Sex Delivery Anes PTL Lv  4 SAB           3 Para           2 Para           1 Para             Vaginal deliveries: 3,  Forceps/ Vacuum deliveries: 0, Cesarean section: 0 S/p hysterectomy   Medications: She has a current medication list which includes the following prescription(s): probiotic, butalbital-acetaminophen-caffeine, calcium carbonate-vitamin d, vitamin d3, cyclobenzaprine, diazepam, esomeprazole, famotidine, levothyroxine, melatonin, montelukast, omega-3 acid ethyl esters, polyethylene glycol 3350, pregabalin, simvastatin, topiramate, trospium chloride, and venlafaxine xr.   Allergies: Patient has No Known Allergies.   Social History:  Social History   Tobacco Use   Smoking status: Former    Packs/day: 0.10    Years: 2.00    Pack years: 0.20    Types: Cigarettes    Start date: 11/16/1959    Quit  date: 11/15/1960    Years since quitting: 61.4   Smokeless tobacco: Never  Vaping Use   Vaping Use: Never used  Substance Use Topics   Alcohol use: Yes    Comment: occ   Drug use: No    Relationship status: married She lives with husband.   She is not employed. Regular exercise: No History  of abuse: No  Family History:   Family History  Problem Relation Age of Onset   Stroke Mother        died age 39   Diabetes Mother    Breast cancer Mother 73   Breast cancer Sister 85   Breast cancer Paternal Aunt 41   Diabetes Maternal Grandfather    Breast cancer Paternal Grandmother 7   Breast cancer Paternal Aunt        dx in her 47s   Cancer Maternal Grandmother        intra-abdominal cancer   Brain cancer Maternal Uncle 59   Brain cancer Cousin 66       maternal cousin   Brain cancer Cousin 41       paternal cousin   Colon cancer Neg Hx      Review of Systems: Review of Systems  Constitutional:  Negative for fever, malaise/fatigue and weight loss.  Respiratory:  Negative for cough, shortness of breath and wheezing.   Cardiovascular:  Negative for chest pain, palpitations and leg swelling.  Gastrointestinal:  Positive for blood in stool. Negative for abdominal pain.  Genitourinary:  Negative for dysuria.  Musculoskeletal:  Positive for myalgias.  Skin:  Negative for rash.  Neurological:  Positive for headaches. Negative for dizziness.  Endo/Heme/Allergies:  Does not bruise/bleed easily.  Psychiatric/Behavioral:  Positive for depression. The patient is not nervous/anxious.     OBJECTIVE Physical Exam: Vitals:   04/01/22 1333  BP: 130/83  Pulse: 74  Weight: 170 lb (77.1 kg)  Height: '4\' 11"'$  (1.499 m)    Physical Exam   GU / Detailed Urogynecologic Evaluation:  Pelvic Exam: Normal external female genitalia; Bartholin's and Skene's glands normal in appearance; urethral meatus normal in appearance, no urethral masses or discharge.   CST: negative s/p hysterectomy:  Speculum exam reveals normal vaginal mucosa with  atrophy and normal vaginal cuff.  Adnexa no mass, fullness, tenderness.    Pelvic floor strength I/V, puborectalis II/V external anal sphincter I/V  Pelvic floor musculature: Right levator non-tender, Right obturator non-tender, Left levator non-tender, Left obturator non-tender  POP-Q:   POP-Q  -2                                            Aa   -2                                           Ba  -7                                              C   4                                            Gh  6                                            Pb  9                                            tvl   -0.5                                            Ap  -0.5                                            Bp                                                 D     Rectal Exam:  Normal sphincter tone, moderate distal rectocele, enterocoele not present, no rectal masses, no sign of dyssynergia when asking the patient to bear down.  Post-Void Residual (PVR) by Bladder Scan: In order to evaluate bladder emptying, we discussed obtaining a postvoid residual and she agreed to this procedure.  Procedure: The ultrasound unit was placed on the patient's abdomen in the suprapubic region after the patient had voided. A PVR of 36 ml was obtained by bladder scan.  Laboratory Results: POC urine: negatve   ASSESSMENT AND PLAN Ms. Cuttino is a 81 y.o. with:  1. Urinary frequency   2. Overactive bladder   3. SUI (stress urinary incontinence, female)   4. Prolapse of posterior vaginal wall    OAB - We discussed the symptoms of overactive bladder (OAB), which include urinary urgency, urinary frequency, nocturia, with or without urge incontinence.  While we do not know the exact etiology of OAB, several treatment options exist. We discussed management including behavioral therapy (decreasing bladder irritants, urge suppression strategies, timed voids,  bladder retraining), physical therapy, medication. - - Will start trospium '60mg'$  ER daily. For anticholinergic medications, we discussed the potential side effects of anticholinergics including dry eyes, dry mouth, constipation, cognitive impairment and urinary retention.   2. SUI - does not feel this is an issue and not interested in treatment.   3. Stage I anterior, Stage II posterior, Stage I apical prolapse - Asymptomatic, does not desire treatment  Return 6 weeks for follow up  Jaquita Folds, MD

## 2022-04-06 ENCOUNTER — Encounter: Payer: Self-pay | Admitting: Obstetrics and Gynecology

## 2022-04-22 ENCOUNTER — Other Ambulatory Visit: Payer: Self-pay | Admitting: Physician Assistant

## 2022-04-30 ENCOUNTER — Other Ambulatory Visit: Payer: Self-pay | Admitting: Adult Health

## 2022-04-30 DIAGNOSIS — R052 Subacute cough: Secondary | ICD-10-CM

## 2022-05-04 MED ORDER — MONTELUKAST SODIUM 10 MG PO TABS: 10.0000 mg | ORAL_TABLET | Freq: Every day | ORAL | 1 refills | Status: DC

## 2022-05-16 ENCOUNTER — Other Ambulatory Visit: Payer: Self-pay | Admitting: Adult Health

## 2022-05-25 ENCOUNTER — Ambulatory Visit (INDEPENDENT_AMBULATORY_CARE_PROVIDER_SITE_OTHER): Payer: Medicare HMO | Admitting: Adult Health

## 2022-05-25 ENCOUNTER — Ambulatory Visit (INDEPENDENT_AMBULATORY_CARE_PROVIDER_SITE_OTHER): Payer: Medicare HMO

## 2022-05-25 ENCOUNTER — Encounter: Payer: Self-pay | Admitting: Adult Health

## 2022-05-25 VITALS — BP 138/92 | HR 76 | Temp 97.6°F | Ht 59.0 in | Wt 171.0 lb

## 2022-05-25 DIAGNOSIS — M545 Low back pain, unspecified: Secondary | ICD-10-CM

## 2022-05-25 DIAGNOSIS — E785 Hyperlipidemia, unspecified: Secondary | ICD-10-CM | POA: Diagnosis not present

## 2022-05-25 DIAGNOSIS — Z0000 Encounter for general adult medical examination without abnormal findings: Secondary | ICD-10-CM

## 2022-05-25 DIAGNOSIS — M25562 Pain in left knee: Secondary | ICD-10-CM | POA: Diagnosis not present

## 2022-05-25 DIAGNOSIS — M25561 Pain in right knee: Secondary | ICD-10-CM | POA: Diagnosis not present

## 2022-05-25 DIAGNOSIS — G8929 Other chronic pain: Secondary | ICD-10-CM

## 2022-05-25 DIAGNOSIS — M797 Fibromyalgia: Secondary | ICD-10-CM

## 2022-05-25 DIAGNOSIS — K219 Gastro-esophageal reflux disease without esophagitis: Secondary | ICD-10-CM

## 2022-05-25 DIAGNOSIS — Z8669 Personal history of other diseases of the nervous system and sense organs: Secondary | ICD-10-CM

## 2022-05-25 DIAGNOSIS — F330 Major depressive disorder, recurrent, mild: Secondary | ICD-10-CM

## 2022-05-25 LAB — CBC WITH DIFFERENTIAL/PLATELET
Basophils Absolute: 0 10*3/uL (ref 0.0–0.1)
Basophils Relative: 0.4 % (ref 0.0–3.0)
Eosinophils Absolute: 0.2 10*3/uL (ref 0.0–0.7)
Eosinophils Relative: 2.2 % (ref 0.0–5.0)
HCT: 47.2 % — ABNORMAL HIGH (ref 36.0–46.0)
Hemoglobin: 15.7 g/dL — ABNORMAL HIGH (ref 12.0–15.0)
Lymphocytes Relative: 25.4 % (ref 12.0–46.0)
Lymphs Abs: 2 10*3/uL (ref 0.7–4.0)
MCHC: 33.2 g/dL (ref 30.0–36.0)
MCV: 90.9 fl (ref 78.0–100.0)
Monocytes Absolute: 0.5 10*3/uL (ref 0.1–1.0)
Monocytes Relative: 6.6 % (ref 3.0–12.0)
Neutro Abs: 5.1 10*3/uL (ref 1.4–7.7)
Neutrophils Relative %: 65.4 % (ref 43.0–77.0)
Platelets: 186 10*3/uL (ref 150.0–400.0)
RBC: 5.2 Mil/uL — ABNORMAL HIGH (ref 3.87–5.11)
RDW: 14.8 % (ref 11.5–15.5)
WBC: 7.8 10*3/uL (ref 4.0–10.5)

## 2022-05-25 LAB — LIPID PANEL
Cholesterol: 202 mg/dL — ABNORMAL HIGH (ref 0–200)
HDL: 52.8 mg/dL (ref >39.00)
LDL Cholesterol: 133 mg/dL — ABNORMAL HIGH (ref 0–99)
NonHDL: 149.28
Total CHOL/HDL Ratio: 4
Triglycerides: 80 mg/dL (ref 0.0–149.0)
VLDL: 16 mg/dL (ref 0.0–40.0)

## 2022-05-25 LAB — COMPREHENSIVE METABOLIC PANEL
ALT: 20 U/L (ref 0–35)
AST: 17 U/L (ref 0–37)
Albumin: 4.7 g/dL (ref 3.5–5.2)
Alkaline Phosphatase: 58 U/L (ref 39–117)
BUN: 17 mg/dL (ref 6–23)
CO2: 27 mEq/L (ref 19–32)
Calcium: 9.8 mg/dL (ref 8.4–10.5)
Chloride: 104 mEq/L (ref 96–112)
Creatinine, Ser: 0.78 mg/dL (ref 0.40–1.20)
GFR: 72.39 mL/min (ref >60.00)
Glucose, Bld: 113 mg/dL — ABNORMAL HIGH (ref 70–99)
Potassium: 4 mEq/L (ref 3.5–5.1)
Sodium: 140 mEq/L (ref 135–145)
Total Bilirubin: 0.6 mg/dL (ref 0.2–1.2)
Total Protein: 7 g/dL (ref 6.0–8.3)

## 2022-05-25 LAB — HEMOGLOBIN A1C: Hgb A1c MFr Bld: 6.2 % (ref 4.6–6.5)

## 2022-05-25 LAB — TSH: TSH: 1.89 u[IU]/mL (ref 0.35–5.50)

## 2022-05-25 MED ORDER — PREGABALIN 150 MG PO CAPS: 150.0000 mg | ORAL_CAPSULE | Freq: Two times a day (BID) | ORAL | 0 refills | Status: DC

## 2022-05-25 MED ORDER — VENLAFAXINE HCL ER 37.5 MG PO CP24: ORAL_CAPSULE | ORAL | 1 refills | Status: DC

## 2022-05-25 NOTE — Progress Notes (Signed)
Subjective:    [name], female    DOB: Oct 04, 1941, 81 y.o.   MRN: [id number]  HPI Patient presents for yearly preventative medicine examination. She is a 81 year old female who  has a past medical history of Adenomatous colon polyp, Anxiety, Arthritis, Breast cancer ([place]) (08/08/13), Cataract, Chronic insomnia, Cluster headaches, Depression, Diverticulosis, Fatty liver (2011), Fibromyalgia, GERD (gastroesophageal reflux disease), H/O hiatal hernia, History of transfusion (08/30/2013), radiation therapy (10/29/13- 12/14/13), Hypothyroidism, Internal hemorrhoids, Irritable bowel syndrome, Kidney stone, Lymphedema, Macular degeneration, MDD (major depressive disorder), Osteopenia, Other abnormal glucose, Other and unspecified hyperlipidemia, Pain in joint, shoulder region, Pneumonia (3734,2876), Sleep apnea, and Stress incontinence, female.  Hypothyroidism-takes Synthroid 100 mcg daily.  Anxiety and depression-managed with Effexor 37.5 mg extended release daily and Valium as needed  Hyperlipidemia-takes Zocor 20 mg at bedtime and Lovaza 1 g twice daily.  She denies myalgia or fatigue  Lab Results  Component Value Date   CHOL 209 (H) 03/06/2021   HDL 51.10 03/06/2021   LDLCALC 138 (H) 03/06/2021   LDLDIRECT 113.0 05/17/2016   TRIG 101.0 03/06/2021   CHOLHDL 4 03/06/2021   History of fibromyalgia-is managed by rheumatology.  Has generalized pain and discomfort. Managed with stretching and aerobic exercises. Also prescribed Lyrica 150 mg BID.   GERD- managed with nexium and pepcid. She does have a history of GI bleed but has not noticed any blood in her stool.  Migraine Headaches - takes Topamax 25 mg daily and Fioricet PRN. She feels as though her migraines are well controlled.   Chronic Low Back Pain - is seen by [facility] - has had steroid injections and found these beneficial   Chronic Bilateral knee pain -history of right partial knee in 2016 and  left partial knee in 2012.  She reports questionable worsening discomfort in bilateral knees especially when climbing stairs or stepping down off objects.  All immunizations and health maintenance protocols were reviewed with the patient and needed orders were placed.  Appropriate screening laboratory values were ordered for the patient including screening of hyperlipidemia, renal function and hepatic function. If indicated by BPH, a PSA was ordered.  Medication reconciliation,  past medical history, social history, problem list and allergies were reviewed in detail with the patient  Goals were established with regard to weight loss, exercise, and  diet in compliance with medications. She has recently started back swimming.    Review of Systems  Constitutional: Negative.   HENT: Negative.    Eyes: Negative.   Respiratory: Negative.    Cardiovascular: Negative.   Gastrointestinal: Negative.   Endocrine: Negative.   Genitourinary: Negative.   Musculoskeletal:  Positive for arthralgias, back pain and gait problem.  Skin: Negative.   Allergic/Immunologic: Negative.   Hematological: Negative.   Psychiatric/Behavioral: Negative.    All other systems reviewed and are negative.  Past Medical History:  Diagnosis Date   Adenomatous colon polyp    Anxiety    PHOBIAS   Arthritis    Breast cancer ([place]) 08/08/13   right LOQ   Cataract    Chronic insomnia    Cluster headaches    history of migraines / NONE FOR SEVERAL YRS   Depression    Diverticulosis    Fatty liver 2011   Fibromyalgia    GERD (gastroesophageal reflux disease)    H/O hiatal hernia    History of transfusion 08/30/2013   Hx of radiation therapy 10/29/13- 12/14/13   right chest wall 5040 cGy  28 sessions, right supraclavicular/axillary region 5040 cGy 28 sessions, right chest wall boost 1000 cGy 5 sessions   Hypothyroidism    Internal hemorrhoids    Irritable bowel syndrome    Kidney stone    Lymphedema    RT ARM -  WEARS SLEEVE   Macular degeneration    hole/right eye   MDD (major depressive disorder)    Osteopenia    Other abnormal glucose    Other and unspecified hyperlipidemia    Pain in joint, shoulder region    Pneumonia 2197,5883   Sleep apnea    USES C-PAP   Stress incontinence, female     Social History   Socioeconomic History   Marital status: Married    Spouse name: Fritz Pickerel   Number of children: 3   Years of education: Not on file   Highest education level: Not on file  Occupational History   Occupation: retired bookkeeper  Tobacco Use   Smoking status: Former    Packs/day: 0.10    Years: 2.00    Total pack years: 0.20    Types: Cigarettes    Start date: 11/16/1959    Quit date: 11/15/1960    Years since quitting: 61.5   Smokeless tobacco: Never  Vaping Use   Vaping Use: Never used  Substance and Sexual Activity   Alcohol use: Yes    Comment: occ   Drug use: No   Sexual activity: Yes    Comment: menarche age 28, fist live birth 53, P 3, hysterectomy age 81, no HRT, BCP 2 yrs  Other Topics Concern   Not on file  Social History Narrative   Occupation:  Retired Radiation protection practitioner    Married with 3 grown children      Never Smoked     Alcohol use-no        Social Determinants of Health   Financial Resource Strain: Low Risk  (09/15/2021)   Overall Financial Resource Strain (CARDIA)    Difficulty of Paying Living Expenses: Not hard at all  Food Insecurity: No Food Insecurity (09/15/2021)   Hunger Vital Sign    Worried About Running Out of Food in the Last Year: Never true    Ran Out of Food in the Last Year: Never true  Transportation Needs: No Transportation Needs (09/15/2021)   PRAPARE - Hydrologist (Medical): No    Lack of Transportation (Non-Medical): No  Physical Activity: Inactive (09/15/2021)   Exercise Vital Sign    Days of Exercise per Week: 0 days    Minutes of Exercise per Session: 30 min  Stress: Stress Concern Present (09/15/2021)    [place]    Feeling of Stress : To some extent  Social Connections: Socially Integrated (09/15/2021)   Social Connection and Isolation Panel [NHANES]    Frequency of Communication with Friends and Family: More than three times a week    Frequency of Social Gatherings with Friends and Family: Twice a week    Attends Religious Services: More than 4 times per year    Active Member of [employer] or Organizations: Yes    Attends Music therapist: More than 4 times per year    Marital Status: Married  Human resources officer Violence: Not At Risk (07/22/2021)   Humiliation, Afraid, Rape, and Kick questionnaire    Fear of Current or Ex-Partner: No    Emotionally Abused: No    Physically Abused: No    Sexually  Abused: No    Past Surgical History:  Procedure Laterality Date   ABDOMINAL HYSTERECTOMY     APPENDECTOMY     BILATERAL TOTAL MASTECTOMY WITH AXILLARY LYMPH NODE DISSECTION  08/30/2013   Dr Barry Dienes   BREAST CYST ASPIRATION     9 cysts   CATARACT EXTRACTION, BILATERAL  2005/2007   CHOLECYSTECTOMY     COLONOSCOPY     CYSTOCELE REPAIR     EVACUATION BREAST HEMATOMA Left 08/31/2013   Procedure: EVACUATION HEMATOMA BREAST;  Surgeon: Stark Klein, MD;  Location: [place];  Service: General;  Laterality: Left;   EYE SURGERY     to repair macular hole   FOOT ARTHROPLASTY     lt    GANGLION CYST EXCISION     rt foot   HEMORRHOID SURGERY     03/1993   JOINT REPLACEMENT  03/15/11   left knee replacement   KNEE ARTHROSCOPY     /partial knee 2016/left knee 2012   MASS EXCISION  11/04/2011   Procedure: EXCISION MASS;  Surgeon: Cammie Sickle., MD;  Location: [place];  Service: Orthopedics;  Laterality: Right;  excisional biopsy right ulna mass   MASTECTOMY W/ SENTINEL NODE BIOPSY Right 08/30/2013   Procedure: RIGHT  AXILLARY SENTINEL LYMPH NODE BIOPSY; Right Axillary Node Disection;  Surgeon: Stark Klein, MD;  Location: [place];  Service: General;  Laterality: Right;  Right side nuc med 7:00    PARTIAL KNEE ARTHROPLASTY Right 11/03/2015   Procedure: RIGHT KNEE MEDIAL UNICOMPARTMENTAL ARTHROPLASTY ;  Surgeon: Gaynelle Arabian, MD;  Location: [facility];  Service: Orthopedics;  Laterality: Right;   RECTOCELE REPAIR     SIMPLE MASTECTOMY WITH AXILLARY SENTINEL NODE BIOPSY Left 08/30/2013   Procedure: Bilateral Breast Mastectomy ;  Surgeon: Stark Klein, MD;  Location: [place];  Service: General;  Laterality: Left;   skin tags removed     breast, panty line, neckline   TOE SURGERY     preventative crossover toe surg/right foot   TOE SURGERY  2009   left foot/screw  in 2nd toe   TONSILLECTOMY     UPPER GASTROINTESTINAL ENDOSCOPY      Family History  Problem Relation Age of Onset   Stroke Mother        died age 69   Diabetes Mother    Breast cancer Mother 35   Breast cancer Sister 16   Breast cancer Paternal Aunt 67   Diabetes Maternal Grandfather    Breast cancer Paternal Grandmother 59   Breast cancer Paternal Aunt        dx in her 47s   Cancer Maternal Grandmother        intra-abdominal cancer   Brain cancer Maternal Uncle 8   Brain cancer Cousin 38       maternal cousin   Brain cancer Cousin 61       paternal cousin   Colon cancer Neg Hx     No Known Allergies  Current Outpatient Medications on File Prior to Visit  Medication Sig Dispense Refill   Bacillus Coagulans-Inulin (PROBIOTIC) 1-250 BILLION-MG CAPS Take by mouth.     butalbital-acetaminophen-caffeine (FIORICET) 50-325-40 MG tablet Take 1 tablet by mouth as needed for headache. 30 tablet 2   Calcium Carbonate-Vitamin D (CALCIUM-VITAMIN D3 PO) Take 1 tablet by mouth in the morning and at bedtime.     Cholecalciferol (VITAMIN D3) 50 MCG (2000 UT) TABS Take 2 tablets by mouth daily.     diazepam (  VALIUM) 5 MG tablet Take 5 mg by mouth every 6 (six) hours as needed for anxiety. As needed (flying & dental)     esomeprazole  (NEXIUM) 40 MG capsule Take 1 capsule (40 mg total) by mouth daily at 12 noon. (Patient taking differently: Take 40 mg by mouth daily before breakfast.) 365 capsule 0   famotidine (PEPCID) 40 MG tablet TAKE ONE TABLET BY MOUTH EVERY NIGHT AT BEDTIME 90 tablet 1   levothyroxine (SYNTHROID) 100 MCG tablet TAKE ONE TABLET BY MOUTH DAILY 365 tablet 0   MELATONIN PO Take 1 tablet by mouth daily.     montelukast (SINGULAIR) 10 MG tablet Take 1 tablet (10 mg total) by mouth at bedtime. 90 tablet 1   omega-3 acid ethyl esters (LOVAZA) 1 g capsule TAKE ONE CAPSULE BY MOUTH TWICE A DAY 180 capsule 3   Polyethylene Glycol 3350 (MIRALAX PO) Take by mouth. 1/2 capful every morning     simvastatin (ZOCOR) 20 MG tablet TAKE ONE TABLET BY MOUTH EVERY NIGHT AT BEDTIME 365 tablet 0   topiramate (TOPAMAX) 25 MG tablet TAKE ONE TABLET BY MOUTH DAILY 325 tablet 0   Trospium Chloride 60 MG CP24 Take 1 capsule (60 mg total) by mouth daily. 30 capsule 5   No current facility-administered medications on file prior to visit.    BP (!) 138/92   Pulse 76   Temp 97.6 F (36.4 C) (Oral)   Ht '4\' 11"'$  (1.499 m)   Wt 171 lb (77.6 kg)   SpO2 97%   BMI 34.54 kg/m       Objective:   Physical Exam Vitals and nursing note reviewed.  Constitutional:      General: She is not in acute distress.    Appearance: Normal appearance. She is well-developed. She is not ill-appearing.  HENT:     Head: Normocephalic and atraumatic.     Right Ear: Tympanic membrane, ear canal and external ear normal. There is no impacted cerumen.     Left Ear: Tympanic membrane, ear canal and external ear normal. There is no impacted cerumen.     Nose: Nose normal. No congestion or rhinorrhea.     Mouth/Throat:     Mouth: Mucous membranes are moist.     Pharynx: Oropharynx is clear. No oropharyngeal exudate or posterior oropharyngeal erythema.  Eyes:     General:        Right eye: No discharge.        Left eye: No discharge.     Extraocular  Movements: Extraocular movements intact.     Conjunctiva/sclera: Conjunctivae normal.     Pupils: Pupils are equal, round, and reactive to light.  Neck:     Thyroid: No thyromegaly.     Vascular: No carotid bruit.     Trachea: No tracheal deviation.  Cardiovascular:     Rate and Rhythm: Normal rate and regular rhythm.     Pulses: Normal pulses.     Heart sounds: Normal heart sounds. No murmur heard.    No friction rub. No gallop.  Pulmonary:     Effort: Pulmonary effort is normal. No respiratory distress.     Breath sounds: Normal breath sounds. No stridor. No wheezing, rhonchi or rales.  Chest:     Chest wall: No tenderness.  Abdominal:     General: Abdomen is flat. Bowel sounds are normal. There is no distension.     Palpations: Abdomen is soft. There is no mass.     Tenderness: There  is no abdominal tenderness. There is no right CVA tenderness, left CVA tenderness, guarding or rebound.     Hernia: No hernia is present.  Musculoskeletal:        General: No swelling, tenderness, deformity or signs of injury. Normal range of motion.     Cervical back: Normal range of motion and neck supple.     Right lower leg: No edema.     Left lower leg: No edema.  Lymphadenopathy:     Cervical: No cervical adenopathy.  Skin:    General: Skin is warm and dry.     Coloration: Skin is not jaundiced or pale.     Findings: No bruising, erythema, lesion or rash.  Neurological:     General: No focal deficit present.     Mental Status: She is alert and oriented to person, place, and time.     Cranial Nerves: No cranial nerve deficit.     Sensory: No sensory deficit.     Motor: No weakness.     Coordination: Coordination normal.     Gait: Gait normal.     Deep Tendon Reflexes: Reflexes normal.  Psychiatric:        Mood and Affect: Mood normal.        Behavior: Behavior normal.        Thought Content: Thought content normal.        Judgment: Judgment normal.       Assessment & Plan:  1.  Routine general medical examination at a health care facility - Encouraged to stay active  - Continue to eat healthy  - Follow up in one year or sooner if needed - CBC with Differential/Platelet; Future - Comprehensive metabolic panel; Future - Hemoglobin A1c; Future - Lipid panel; Future - TSH; Future - TSH - Lipid panel - Hemoglobin A1c - Comprehensive metabolic panel - CBC with Differential/Platelet  2. Fibromyalgia  - pregabalin (LYRICA) 150 MG capsule; Take 1 capsule (150 mg total) by mouth 2 (two) times daily.  Dispense: 180 capsule; Refill: 0  3. Chronic pain of right knee  - DG Knee 1-2 Views Right; Future  4. Chronic pain of left knee  - DG Knee 1-2 Views Left; Future  5. Gastroesophageal reflux disease without esophagitis - Continue current therapy  - CBC with Differential/Platelet; Future - Comprehensive metabolic panel; Future - Hemoglobin A1c; Future - Lipid panel; Future - TSH; Future - TSH - Lipid panel - Hemoglobin A1c - Comprehensive metabolic panel - CBC with Differential/Platelet  6. Hx of migraine headaches - Continue with Topamax  7. Chronic bilateral low back pain without sciatica - Follow up with Neurosurgery as directed  8. Hyperlipidemia, unspecified hyperlipidemia type  - CBC with Differential/Platelet; Future - Comprehensive metabolic panel; Future - Hemoglobin A1c; Future - Lipid panel; Future - TSH; Future - TSH - Lipid panel - Hemoglobin A1c - Comprehensive metabolic panel - CBC with Differential/Platelet  9. MDD (major depressive disorder), recurrent episode, mild (HCC)  - venlafaxine XR (EFFEXOR-XR) 37.5 MG 24 hr capsule; TAKE ONE CAPSULE BY MOUTH DAILY WITH BREAKFAST  Dispense: 90 capsule; Refill: 1  [name], NP

## 2022-05-26 ENCOUNTER — Encounter: Payer: Self-pay | Admitting: Adult Health

## 2022-05-27 ENCOUNTER — Ambulatory Visit (INDEPENDENT_AMBULATORY_CARE_PROVIDER_SITE_OTHER): Payer: Medicare HMO | Admitting: Adult Health

## 2022-05-27 ENCOUNTER — Encounter: Payer: Self-pay | Admitting: Adult Health

## 2022-05-27 VITALS — BP 120/70 | HR 92 | Temp 98.5°F | Ht 59.0 in | Wt 164.0 lb

## 2022-05-27 DIAGNOSIS — N3001 Acute cystitis with hematuria: Secondary | ICD-10-CM

## 2022-05-27 DIAGNOSIS — R350 Frequency of micturition: Secondary | ICD-10-CM | POA: Diagnosis not present

## 2022-05-27 LAB — POCT URINALYSIS DIPSTICK
Bilirubin, UA: NEGATIVE
Blood, UA: POSITIVE
Glucose, UA: NEGATIVE
Ketones, UA: POSITIVE
Nitrite, UA: POSITIVE
Protein, UA: POSITIVE — AB
Spec Grav, UA: >=1.03 — AB (ref 1.010–1.025)
Urobilinogen, UA: 0.2 E.U./dL
pH, UA: 6 (ref 5.0–8.0)

## 2022-05-27 MED ORDER — ROSUVASTATIN CALCIUM 20 MG PO TABS: 20.0000 mg | ORAL_TABLET | Freq: Every day | ORAL | 2 refills | Status: DC

## 2022-05-27 MED ORDER — CIPROFLOXACIN HCL 500 MG PO TABS: 500.0000 mg | ORAL_TABLET | Freq: Two times a day (BID) | ORAL | 0 refills | Status: AC

## 2022-05-27 NOTE — Progress Notes (Signed)
Subjective:    [name], female    DOB: [birth year], 81 y.o.   MRN: [id number]  HPI 81 year old female who  has a past medical history of Adenomatous colon polyp, Anxiety, Arthritis, Breast cancer ([place]) (08/08/13), Cataract, Chronic insomnia, Cluster headaches, Depression, Diverticulosis, Fatty liver (2011), Fibromyalgia, GERD (gastroesophageal reflux disease), H/O hiatal hernia, History of transfusion (08/30/2013), radiation therapy (10/29/13- 12/14/13), Hypothyroidism, Internal hemorrhoids, Irritable bowel syndrome, Kidney stone, Lymphedema, Macular degeneration, MDD (major depressive disorder), Osteopenia, Other abnormal glucose, Other and unspecified hyperlipidemia, Pain in joint, shoulder region, Pneumonia (0350,0938), Sleep apnea, and Stress incontinence, female.  She was in her usual state of health until yesterday when she started to feel ill. She reports becoming shaky and nauseated and then started to experience urinary frequency/urgency.  Associated symptoms include that of chills and appetite loss. She denies dysuria, hematuria, or low back pain. Has history of fecal incontinence and her recent UTI's have been caused by E.Coli bacteria     Review of Systems See HPI   Past Medical History:  Diagnosis Date   Adenomatous colon polyp    Anxiety    PHOBIAS   Arthritis    Breast cancer ([place]) 08/08/13   right LOQ   Cataract    Chronic insomnia    Cluster headaches    history of migraines / NONE FOR SEVERAL YRS   Depression    Diverticulosis    Fatty liver 2011   Fibromyalgia    GERD (gastroesophageal reflux disease)    H/O hiatal hernia    History of transfusion 08/30/2013   Hx of radiation therapy 10/29/13- 12/14/13   right chest wall 5040 cGy 28 sessions, right supraclavicular/axillary region 5040 cGy 28 sessions, right chest wall boost 1000 cGy 5 sessions   Hypothyroidism    Internal hemorrhoids    Irritable bowel syndrome    Kidney stone    Lymphedema     RT ARM - WEARS SLEEVE   Macular degeneration    hole/right eye   MDD (major depressive disorder)    Osteopenia    Other abnormal glucose    Other and unspecified hyperlipidemia    Pain in joint, shoulder region    Pneumonia 1829,9371   Sleep apnea    USES C-PAP   Stress incontinence, female     Social History   Socioeconomic History   Marital status: Married    Spouse name: Fritz Pickerel   Number of children: 3   Years of education: Not on file   Highest education level: Not on file  Occupational History   Occupation: retired bookkeeper  Tobacco Use   Smoking status: Former    Packs/day: 0.10    Years: 2.00    Total pack years: 0.20    Types: Cigarettes    Start date: 11/16/1959    Quit date: 11/15/1960    Years since quitting: 61.5   Smokeless tobacco: Never  Vaping Use   Vaping Use: Never used  Substance and Sexual Activity   Alcohol use: Yes    Comment: occ   Drug use: No   Sexual activity: Yes    Comment: menarche age 88, fist live birth 46, P 3, hysterectomy age 44, no HRT, BCP 2 yrs  Other Topics Concern   Not on file  Social History Narrative   Occupation:  Retired Radiation protection practitioner    Married with 3 grown children      Never Smoked     Alcohol use-no  Social Determinants of Health   Financial Resource Strain: Low Risk  (09/15/2021)   Overall Financial Resource Strain (CARDIA)    Difficulty of Paying Living Expenses: Not hard at all  Food Insecurity: No Food Insecurity (09/15/2021)   Hunger Vital Sign    Worried About Running Out of Food in the Last Year: Never true    Ran Out of Food in the Last Year: Never true  Transportation Needs: No Transportation Needs (09/15/2021)   PRAPARE - Hydrologist (Medical): No    Lack of Transportation (Non-Medical): No  Physical Activity: Inactive (09/15/2021)   Exercise Vital Sign    Days of Exercise per Week: 0 days    Minutes of Exercise per Session: 30 min  Stress: Stress Concern Present  (09/15/2021)   [place]    Feeling of Stress : To some extent  Social Connections: Socially Integrated (09/15/2021)   Social Connection and Isolation Panel [NHANES]    Frequency of Communication with Friends and Family: More than three times a week    Frequency of Social Gatherings with Friends and Family: Twice a week    Attends Religious Services: More than 4 times per year    Active Member of Clubs or Organizations: Yes    Attends Archivist Meetings: More than 4 times per year    Marital Status: Married  Human resources officer Violence: Not At Risk (07/22/2021)   Humiliation, Afraid, Rape, and Kick questionnaire    Fear of Current or Ex-Partner: No    Emotionally Abused: No    Physically Abused: No    Sexually Abused: No    Past Surgical History:  Procedure Laterality Date   ABDOMINAL HYSTERECTOMY     APPENDECTOMY     BILATERAL TOTAL MASTECTOMY WITH AXILLARY LYMPH NODE DISSECTION  08/30/2013   Dr Barry Dienes   BREAST CYST ASPIRATION     9 cysts   CATARACT EXTRACTION, BILATERAL  2005/2007   CHOLECYSTECTOMY     COLONOSCOPY     CYSTOCELE REPAIR     EVACUATION BREAST HEMATOMA Left 08/31/2013   Procedure: EVACUATION HEMATOMA BREAST;  Surgeon: Stark Klein, MD;  Location: [facility];  Service: General;  Laterality: Left;   EYE SURGERY     to repair macular hole   FOOT ARTHROPLASTY     lt    GANGLION CYST EXCISION     rt foot   HEMORRHOID SURGERY     03/1993   JOINT REPLACEMENT  03/15/11   left knee replacement   KNEE ARTHROSCOPY     /partial knee 2016/left knee 2012   MASS EXCISION  11/04/2011   Procedure: EXCISION MASS;  Surgeon: Cammie Sickle., MD;  Location: [facility];  Service: Orthopedics;  Laterality: Right;  excisional biopsy right ulna mass   MASTECTOMY W/ SENTINEL NODE BIOPSY Right 08/30/2013   Procedure: RIGHT  AXILLARY SENTINEL LYMPH NODE BIOPSY; Right Axillary Node Disection;   Surgeon: Stark Klein, MD;  Location: [facility];  Service: General;  Laterality: Right;  Right side nuc med 7:00    PARTIAL KNEE ARTHROPLASTY Right 11/03/2015   Procedure: RIGHT KNEE MEDIAL UNICOMPARTMENTAL ARTHROPLASTY ;  Surgeon: Gaynelle Arabian, MD;  Location: [facility];  Service: Orthopedics;  Laterality: Right;   RECTOCELE REPAIR     SIMPLE MASTECTOMY WITH AXILLARY SENTINEL NODE BIOPSY Left 08/30/2013   Procedure: Bilateral Breast Mastectomy ;  Surgeon: Stark Klein, MD;  Location: [facility];  Service:  General;  Laterality: Left;   skin tags removed     breast, panty line, neckline   TOE SURGERY     preventative crossover toe surg/right foot   TOE SURGERY  2009   left foot/screw  in 2nd toe   TONSILLECTOMY     UPPER GASTROINTESTINAL ENDOSCOPY      Family History  Problem Relation Age of Onset   Stroke Mother        died age 54   Diabetes Mother    Breast cancer Mother 69   Breast cancer Sister 72   Breast cancer Paternal Aunt 28   Diabetes Maternal Grandfather    Breast cancer Paternal Grandmother 70   Breast cancer Paternal Aunt        dx in her 28s   Cancer Maternal Grandmother        intra-abdominal cancer   Brain cancer Maternal Uncle 8   Brain cancer Cousin 28       maternal cousin   Brain cancer Cousin 4       paternal cousin   Colon cancer Neg Hx     No Known Allergies  Current Outpatient Medications on File Prior to Visit  Medication Sig Dispense Refill   Bacillus Coagulans-Inulin (PROBIOTIC) 1-250 BILLION-MG CAPS Take by mouth.     butalbital-acetaminophen-caffeine (FIORICET) 50-325-40 MG tablet Take 1 tablet by mouth as needed for headache. 30 tablet 2   Calcium Carbonate-Vitamin D (CALCIUM-VITAMIN D3 PO) Take 1 tablet by mouth in the morning and at bedtime.     Cholecalciferol (VITAMIN D3) 50 MCG (2000 UT) TABS Take 2 tablets by mouth daily.     diazepam (VALIUM) 5 MG tablet Take 5 mg by mouth every 6 (six) hours as needed for anxiety. As needed (flying & dental)      esomeprazole (NEXIUM) 40 MG capsule Take 1 capsule (40 mg total) by mouth daily at 12 noon. (Patient taking differently: Take 40 mg by mouth daily before breakfast.) 365 capsule 0   famotidine (PEPCID) 40 MG tablet TAKE ONE TABLET BY MOUTH EVERY NIGHT AT BEDTIME 90 tablet 1   levothyroxine (SYNTHROID) 100 MCG tablet TAKE ONE TABLET BY MOUTH DAILY 365 tablet 0   MELATONIN PO Take 1 tablet by mouth daily.     montelukast (SINGULAIR) 10 MG tablet Take 1 tablet (10 mg total) by mouth at bedtime. 90 tablet 1   omega-3 acid ethyl esters (LOVAZA) 1 g capsule TAKE ONE CAPSULE BY MOUTH TWICE A DAY 180 capsule 3   Polyethylene Glycol 3350 (MIRALAX PO) Take by mouth. 1/2 capful every morning     pregabalin (LYRICA) 150 MG capsule Take 1 capsule (150 mg total) by mouth 2 (two) times daily. 180 capsule 0   simvastatin (ZOCOR) 20 MG tablet TAKE ONE TABLET BY MOUTH EVERY NIGHT AT BEDTIME 365 tablet 0   topiramate (TOPAMAX) 25 MG tablet TAKE ONE TABLET BY MOUTH DAILY 325 tablet 0   Trospium Chloride 60 MG CP24 Take 1 capsule (60 mg total) by mouth daily. 30 capsule 5   venlafaxine XR (EFFEXOR-XR) 37.5 MG 24 hr capsule TAKE ONE CAPSULE BY MOUTH DAILY WITH BREAKFAST 90 capsule 1   No current facility-administered medications on file prior to visit.    BP 120/70   Pulse 92   Temp 98.5 F (36.9 C) (Oral)   Ht '4\' 11"'$  (1.499 m)   Wt 164 lb (74.4 kg)   SpO2 97%   BMI 33.12 kg/m  Objective:   Physical Exam Vitals and nursing note reviewed.  Constitutional:      Appearance: Normal appearance.  Abdominal:     General: Abdomen is flat. Bowel sounds are normal.  Skin:    General: Skin is warm and dry.     Capillary Refill: Capillary refill takes less than 2 seconds.  Neurological:     General: No focal deficit present.     Mental Status: She is alert and oriented to person, place, and time.  Psychiatric:        Mood and Affect: Mood normal.        Behavior: Behavior normal.        Thought  Content: Thought content normal.        Judgment: Judgment normal.       Assessment & Plan:  1. Frequent urination  - POC Urinalysis Dipstick + leuks, nit., protein, and ketones.   2. Acute cystitis with hematuria - based on previous Urine Culture will start her on cipro.  - Encouraged to drink plenty of fluids  - ciprofloxacin (CIPRO) 500 MG tablet; Take 1 tablet (500 mg total) by mouth 2 (two) times daily for 3 days.  Dispense: 6 tablet; Refill: 0 - Follow up as needed - Culture, Urine  [name], NP

## 2022-05-27 NOTE — Telephone Encounter (Signed)
FYI pt is on the schedule for today

## 2022-05-27 NOTE — Addendum Note (Signed)
Addended by: Gwenyth Ober R on: 05/27/2022 12:12 PM   Modules accepted: Orders

## 2022-05-30 LAB — URINE CULTURE
MICRO NUMBER:: 13642926
SPECIMEN QUALITY:: ADEQUATE

## 2022-06-01 ENCOUNTER — Ambulatory Visit (INDEPENDENT_AMBULATORY_CARE_PROVIDER_SITE_OTHER): Payer: Medicare HMO | Admitting: Obstetrics and Gynecology

## 2022-06-01 ENCOUNTER — Encounter: Payer: Self-pay | Admitting: Obstetrics and Gynecology

## 2022-06-01 ENCOUNTER — Other Ambulatory Visit: Payer: Self-pay | Admitting: Adult Health

## 2022-06-01 VITALS — BP 137/82 | HR 68

## 2022-06-01 DIAGNOSIS — N390 Urinary tract infection, site not specified: Secondary | ICD-10-CM

## 2022-06-01 DIAGNOSIS — N3281 Overactive bladder: Secondary | ICD-10-CM

## 2022-06-01 MED ORDER — ESTRADIOL 0.1 MG/GM VA CREA: 0.5000 g | TOPICAL_CREAM | VAGINAL | 11 refills | Status: DC

## 2022-06-01 MED ORDER — TROSPIUM CHLORIDE ER 60 MG PO CP24: 1.0000 | ORAL_CAPSULE | Freq: Every day | ORAL | 0 refills | Status: DC

## 2022-06-01 MED ORDER — AMOXICILLIN-POT CLAVULANATE 875-125 MG PO TABS: 1.0000 | ORAL_TABLET | Freq: Two times a day (BID) | ORAL | 0 refills | Status: AC

## 2022-06-01 NOTE — Progress Notes (Signed)
[facility] Return Visit  SUBJECTIVE  History of Present Illness: Jean Nash is a 81 y.o. female seen in follow-up for overactive bladder. Plan at last visit was to start trospium '60mg'$  ER daily.   Leakage has decreased significantly with the medication. Has not had any leakage in the last week. She has been keeping track of her voiding and has gone from voiding 8-9 times per day to 5-6 times.   Diagnosed with a urinary tract infection last week. Also had another infection in April. Wanted to ask about vaginal estrogen use for prevention with her history of breast cancer. She is drinking cranberry juice (sugar free, mixed with water).   Past Medical History: Patient  has a past medical history of Adenomatous colon polyp, Anxiety, Arthritis, Breast cancer ([place]) (08/08/13), Cataract, Chronic insomnia, Cluster headaches, Depression, Diverticulosis, Fatty liver (2011), Fibromyalgia, GERD (gastroesophageal reflux disease), H/O hiatal hernia, History of transfusion (08/30/2013), radiation therapy (10/29/13- 12/14/13), Hypothyroidism, Internal hemorrhoids, Irritable bowel syndrome, Kidney stone, Lymphedema, Macular degeneration, MDD (major depressive disorder), Osteopenia, Other abnormal glucose, Other and unspecified hyperlipidemia, Pain in joint, shoulder region, Pneumonia (4742,5956), Sleep apnea, and Stress incontinence, female.   Past Surgical History: She  has a past surgical history that includes Appendectomy; Cholecystectomy; Abdominal hysterectomy; Tonsillectomy; Eye surgery; Cataract extraction, bilateral (2005/2007); Knee arthroscopy; Joint replacement (03/15/11); Colonoscopy; Ganglion cyst excision; Foot arthroplasty; Mass excision (11/04/2011); Hemorrhoid surgery; Breast cyst aspiration; Toe Surgery; Toe Surgery (2009); Upper gastrointestinal endoscopy; skin tags removed; Bilateral total mastectomy with axillary lymph node dissection (08/30/2013); Simple mastectomy with axillary  sentinel node biopsy (Left, 08/30/2013); Mastectomy w/ sentinel node biopsy (Right, 08/30/2013); Evacuation breast hematoma (Left, 08/31/2013); Partial knee arthroplasty (Right, 11/03/2015); Cystocele repair; and Rectocele repair.   Medications: She has a current medication list which includes the following prescription(s): amoxicillin-clavulanate, probiotic, butalbital-acetaminophen-caffeine, calcium carbonate-vitamin d, vitamin d3, diazepam, esomeprazole, [START ON 06/03/2022] estradiol, famotidine, levothyroxine, melatonin, montelukast, omega-3 acid ethyl esters, polyethylene glycol 3350, pregabalin, rosuvastatin, simvastatin, topiramate, trospium chloride, trospium chloride, and venlafaxine xr.   Allergies: Patient has No Known Allergies.   Social History: Patient  reports that she quit smoking about 61 years ago. Her smoking use included cigarettes. She started smoking about 62 years ago. She has a 0.20 pack-year smoking history. She has never used smokeless tobacco. She reports current alcohol use. She reports that she does not use drugs.      OBJECTIVE     Physical Exam: Vitals:   06/01/22 1303  BP: 137/82  Pulse: 68   Gen: No apparent distress, A&O x 3.  Detailed Urogynecologic Evaluation:  Deferred.    ASSESSMENT AND PLAN    Jean Nash is a 81 y.o. with:  1. Overactive bladder   2. Recurrent UTI    [facility] - Cont Trospium '60mg'$  ER daily. Pt requested a years prescription since this lowers the cost.   2. Recurrent UTI - For treatment of recurrent urinary tract infections, we discussed management of recurrent UTIs including prophylaxis with a daily low dose antibiotic, transvaginal estrogen therapy, D-mannose, and cranberry supplements.  - Rx provided for estrace 0.5g nightly for two weeks then twice a week after. Risks are very low with vaginal estrogen use due to the very low systemic absorption rate of ~ 0.01% with a twice-week regimen and has not been shown to cause  recurrence in women with a history of breast cancer. - Will consider prophylactic antibiotics if she continues to have infections.  Return 1 year or sooner if needed.  Jaquita Folds, MD

## 2022-06-01 NOTE — Patient Instructions (Addendum)
Start vaginal estrogen therapy nightly for two weeks then 2 times weekly at night for treatment of vaginal atrophy (dryness of the vaginal tissues).  Please let us know if the prescription is too expensive and we can look for alternative options.   Continue with trospium '60mg'$  daily for overactive bladder.   For E.Coli infections, take D-mannose over the counter for prevention. Can also take cranberry pills if you do not want to drink cranberry juice.

## 2022-06-09 ENCOUNTER — Ambulatory Visit (INDEPENDENT_AMBULATORY_CARE_PROVIDER_SITE_OTHER): Payer: Medicare HMO | Admitting: Adult Health

## 2022-06-09 ENCOUNTER — Encounter: Payer: Self-pay | Admitting: Adult Health

## 2022-06-09 VITALS — BP 130/82 | HR 73 | Temp 98.5°F | Ht 59.0 in | Wt 173.0 lb

## 2022-06-09 DIAGNOSIS — R5383 Other fatigue: Secondary | ICD-10-CM | POA: Diagnosis not present

## 2022-06-09 NOTE — Progress Notes (Signed)
Subjective:    [name], female    DOB: 1941/11/09, 81 y.o.   MRN: [id number]  HPI 81 year old female who  has a past medical history of Adenomatous colon polyp, Anxiety, Arthritis, Breast cancer ([place]) (08/08/13), Cataract, Chronic insomnia, Cluster headaches, Depression, Diverticulosis, Fatty liver (2011), Fibromyalgia, GERD (gastroesophageal reflux disease), H/O hiatal hernia, History of transfusion (08/30/2013), radiation therapy (10/29/13- 12/14/13), Hypothyroidism, Internal hemorrhoids, Irritable bowel syndrome, Kidney stone, Lymphedema, Macular degeneration, MDD (major depressive disorder), Osteopenia, Other abnormal glucose, Other and unspecified hyperlipidemia, Pain in joint, shoulder region, Pneumonia (6599,3570), Sleep apnea, and Stress incontinence, female.  She presents to the office today for concern of lethargy.  She feels as though "I do not want to do anything besides sleep."  She feels as though this started to become worse after she was placed on trospium chloride 60 mg by uro-GYN about 6 weeks ago.  He has not noticed much difference since starting this medication.   Review of Systems See HPI   Past Medical History:  Diagnosis Date   Adenomatous colon polyp    Anxiety    PHOBIAS   Arthritis    Breast cancer ([place]) 08/08/13   right LOQ   Cataract    Chronic insomnia    Cluster headaches    history of migraines / NONE FOR SEVERAL YRS   Depression    Diverticulosis    Fatty liver 2011   Fibromyalgia    GERD (gastroesophageal reflux disease)    H/O hiatal hernia    History of transfusion 08/30/2013   Hx of radiation therapy 10/29/13- 12/14/13   right chest wall 5040 cGy 28 sessions, right supraclavicular/axillary region 5040 cGy 28 sessions, right chest wall boost 1000 cGy 5 sessions   Hypothyroidism    Internal hemorrhoids    Irritable bowel syndrome    Kidney stone    Lymphedema    RT ARM - WEARS SLEEVE   Macular degeneration    hole/right eye    MDD (major depressive disorder)    Osteopenia    Other abnormal glucose    Other and unspecified hyperlipidemia    Pain in joint, shoulder region    Pneumonia 1779,3903   Sleep apnea    USES C-PAP   Stress incontinence, female     Social History   Socioeconomic History   Marital status: Married    Spouse name: Fritz Pickerel   Number of children: 3   Years of education: Not on file   Highest education level: Not on file  Occupational History   Occupation: retired bookkeeper  Tobacco Use   Smoking status: Former    Packs/day: 0.10    Years: 2.00    Total pack years: 0.20    Types: Cigarettes    Start date: 11/16/1959    Quit date: 11/15/1960    Years since quitting: 61.6   Smokeless tobacco: Never  Vaping Use   Vaping Use: Never used  Substance and Sexual Activity   Alcohol use: Yes    Comment: occ   Drug use: No   Sexual activity: Yes    Comment: menarche age 17, fist live birth 49, P 3, hysterectomy age 58, no HRT, BCP 2 yrs  Other Topics Concern   Not on file  Social History Narrative   Occupation:  Retired Radiation protection practitioner    Married with 3 grown children      Never Smoked     Alcohol use-no        Social  Determinants of Health   Financial Resource Strain: Low Risk  (09/15/2021)   Overall Financial Resource Strain (CARDIA)    Difficulty of Paying Living Expenses: Not hard at all  Food Insecurity: No Food Insecurity (09/15/2021)   Hunger Vital Sign    Worried About Running Out of Food in the Last Year: Never true    Ran Out of Food in the Last Year: Never true  Transportation Needs: No Transportation Needs (09/15/2021)   PRAPARE - Hydrologist (Medical): No    Lack of Transportation (Non-Medical): No  Physical Activity: Inactive (09/15/2021)   Exercise Vital Sign    Days of Exercise per Week: 0 days    Minutes of Exercise per Session: 30 min  Stress: Stress Concern Present (09/15/2021)   [place]    Feeling of Stress : To some extent  Social Connections: Socially Integrated (09/15/2021)   Social Connection and Isolation Panel [NHANES]    Frequency of Communication with Friends and Family: More than three times a week    Frequency of Social Gatherings with Friends and Family: Twice a week    Attends Religious Services: More than 4 times per year    Active Member of Clubs or Organizations: Yes    Attends Archivist Meetings: More than 4 times per year    Marital Status: Married  Human resources officer Violence: Not At Risk (07/22/2021)   Humiliation, Afraid, Rape, and Kick questionnaire    Fear of Current or Ex-Partner: No    Emotionally Abused: No    Physically Abused: No    Sexually Abused: No    Past Surgical History:  Procedure Laterality Date   ABDOMINAL HYSTERECTOMY     APPENDECTOMY     BILATERAL TOTAL MASTECTOMY WITH AXILLARY LYMPH NODE DISSECTION  08/30/2013   Dr Barry Dienes   BREAST CYST ASPIRATION     9 cysts   CATARACT EXTRACTION, BILATERAL  2005/2007   CHOLECYSTECTOMY     COLONOSCOPY     CYSTOCELE REPAIR     EVACUATION BREAST HEMATOMA Left 08/31/2013   Procedure: EVACUATION HEMATOMA BREAST;  Surgeon: Stark Klein, MD;  Location: [facility];  Service: General;  Laterality: Left;   EYE SURGERY     to repair macular hole   FOOT ARTHROPLASTY     lt    GANGLION CYST EXCISION     rt foot   HEMORRHOID SURGERY     03/1993   JOINT REPLACEMENT  03/15/11   left knee replacement   KNEE ARTHROSCOPY     /partial knee 2016/left knee 2012   MASS EXCISION  11/04/2011   Procedure: EXCISION MASS;  Surgeon: Cammie Sickle., MD;  Location: [facility];  Service: Orthopedics;  Laterality: Right;  excisional biopsy right ulna mass   MASTECTOMY W/ SENTINEL NODE BIOPSY Right 08/30/2013   Procedure: RIGHT  AXILLARY SENTINEL LYMPH NODE BIOPSY; Right Axillary Node Disection;  Surgeon: Stark Klein, MD;  Location: [facility];  Service: General;  Laterality:  Right;  Right side nuc med 7:00    PARTIAL KNEE ARTHROPLASTY Right 11/03/2015   Procedure: RIGHT KNEE MEDIAL UNICOMPARTMENTAL ARTHROPLASTY ;  Surgeon: Gaynelle Arabian, MD;  Location: [facility];  Service: Orthopedics;  Laterality: Right;   RECTOCELE REPAIR     SIMPLE MASTECTOMY WITH AXILLARY SENTINEL NODE BIOPSY Left 08/30/2013   Procedure: Bilateral Breast Mastectomy ;  Surgeon: Stark Klein, MD;  Location: [facility];  Service: General;  Laterality: Left;   skin tags removed     breast, panty line, neckline   TOE SURGERY     preventative crossover toe surg/right foot   TOE SURGERY  2009   left foot/screw  in 2nd toe   TONSILLECTOMY     UPPER GASTROINTESTINAL ENDOSCOPY      Family History  Problem Relation Age of Onset   Stroke Mother        died age 51   Diabetes Mother    Breast cancer Mother 30   Breast cancer Sister 21   Breast cancer Paternal Aunt 44   Diabetes Maternal Grandfather    Breast cancer Paternal Grandmother 25   Breast cancer Paternal Aunt        dx in her 43s   Cancer Maternal Grandmother        intra-abdominal cancer   Brain cancer Maternal Uncle 8   Brain cancer Cousin 69       maternal cousin   Brain cancer Cousin 9       paternal cousin   Colon cancer Neg Hx     No Known Allergies  Current Outpatient Medications on File Prior to Visit  Medication Sig Dispense Refill   Bacillus Coagulans-Inulin (PROBIOTIC) 1-250 BILLION-MG CAPS Take by mouth.     butalbital-acetaminophen-caffeine (FIORICET) 50-325-40 MG tablet Take 1 tablet by mouth as needed for headache. 30 tablet 2   Calcium Carbonate-Vitamin D (CALCIUM-VITAMIN D3 PO) Take 1 tablet by mouth in the morning and at bedtime.     Cholecalciferol (VITAMIN D3) 50 MCG (2000 UT) TABS Take 2 tablets by mouth daily.     diazepam (VALIUM) 5 MG tablet Take 5 mg by mouth every 6 (six) hours as needed for anxiety. As needed (flying & dental)     esomeprazole (NEXIUM) 40 MG capsule Take 1 capsule (40 mg total) by mouth  daily at 12 noon. (Patient taking differently: Take 40 mg by mouth daily before breakfast.) 365 capsule 0   estradiol (ESTRACE) 0.1 MG/GM vaginal cream Place 0.5 g vaginally 2 (two) times a week. Place 0.5g nightly for two weeks then twice a week after 30 g 11   famotidine (PEPCID) 40 MG tablet TAKE ONE TABLET BY MOUTH EVERY NIGHT AT BEDTIME 90 tablet 1   levothyroxine (SYNTHROID) 100 MCG tablet TAKE ONE TABLET BY MOUTH DAILY 365 tablet 0   MELATONIN PO Take 1 tablet by mouth daily.     montelukast (SINGULAIR) 10 MG tablet Take 1 tablet (10 mg total) by mouth at bedtime. 90 tablet 1   omega-3 acid ethyl esters (LOVAZA) 1 g capsule TAKE ONE CAPSULE BY MOUTH TWICE A DAY 180 capsule 3   Polyethylene Glycol 3350 (MIRALAX PO) Take by mouth. 1/2 capful every morning     pregabalin (LYRICA) 150 MG capsule Take 1 capsule (150 mg total) by mouth 2 (two) times daily. 180 capsule 0   rosuvastatin (CRESTOR) 20 MG tablet Take 1 tablet (20 mg total) by mouth daily. 90 tablet 2   topiramate (TOPAMAX) 25 MG tablet TAKE ONE TABLET BY MOUTH DAILY 325 tablet 0   Trospium Chloride 60 MG CP24 Take 1 capsule (60 mg total) by mouth daily. 30 capsule 5   Trospium Chloride 60 MG CP24 Take 1 capsule (60 mg total) by mouth daily. 365 capsule 0   venlafaxine XR (EFFEXOR-XR) 37.5 MG 24 hr capsule TAKE ONE CAPSULE BY MOUTH DAILY WITH BREAKFAST 90 capsule 1   No current facility-administered medications on file  prior to visit.    BP 130/82   Pulse 73   Temp 98.5 F (36.9 C) (Oral)   Ht '4\' 11"'$  (1.499 m)   Wt 173 lb (78.5 kg)   SpO2 93%   BMI 34.94 kg/m       Objective:   Physical Exam Vitals and nursing note reviewed.  Constitutional:      Appearance: Normal appearance.  Cardiovascular:     Rate and Rhythm: Normal rate and regular rhythm.     Pulses: Normal pulses.     Heart sounds: Normal heart sounds.  Pulmonary:     Effort: Pulmonary effort is normal.     Breath sounds: Normal breath sounds.  Skin:     General: Skin is warm and dry.  Neurological:     General: No focal deficit present.     Mental Status: She is alert and oriented to person, place, and time.  Psychiatric:        Mood and Affect: Mood normal.        Behavior: Behavior normal.        Thought Content: Thought content normal.        Judgment: Judgment normal.       Assessment & Plan:   1. Other fatigue -We discussed her medications that may be contributing to her symptoms.  We will have her stop the trospium for the next few days to see if her symptoms improved.  We will also have her switch her Effexor to evening dosing and she is going to see if she can cut back her Lyrica to once daily dosing.  We will keep her Topamax dosing the same for the time being  [name], NP

## 2022-06-11 ENCOUNTER — Encounter: Payer: Self-pay | Admitting: Adult Health

## 2022-06-11 NOTE — Telephone Encounter (Signed)
FYI

## 2022-06-15 ENCOUNTER — Other Ambulatory Visit: Payer: Self-pay | Admitting: Adult Health

## 2022-06-15 ENCOUNTER — Encounter: Payer: Self-pay | Admitting: Adult Health

## 2022-06-15 ENCOUNTER — Ambulatory Visit (INDEPENDENT_AMBULATORY_CARE_PROVIDER_SITE_OTHER): Payer: Medicare HMO | Admitting: Adult Health

## 2022-06-15 VITALS — BP 108/80 | HR 81 | Temp 98.0°F | Ht 59.0 in | Wt 167.0 lb

## 2022-06-15 DIAGNOSIS — R630 Anorexia: Secondary | ICD-10-CM | POA: Diagnosis not present

## 2022-06-15 DIAGNOSIS — R5383 Other fatigue: Secondary | ICD-10-CM | POA: Diagnosis not present

## 2022-06-15 LAB — URINALYSIS, ROUTINE W REFLEX MICROSCOPIC
Bilirubin Urine: NEGATIVE
Hgb urine dipstick: NEGATIVE
Nitrite: POSITIVE — AB
Specific Gravity, Urine: 1.025 (ref 1.000–1.030)
Urine Glucose: NEGATIVE
Urobilinogen, UA: 1 (ref 0.0–1.0)
pH: 6 (ref 5.0–8.0)

## 2022-06-15 LAB — CBC WITH DIFFERENTIAL/PLATELET
Basophils Absolute: 0 10*3/uL (ref 0.0–0.1)
Basophils Relative: 0.3 % (ref 0.0–3.0)
Eosinophils Absolute: 0.1 10*3/uL (ref 0.0–0.7)
Eosinophils Relative: 1 % (ref 0.0–5.0)
HCT: 46.7 % — ABNORMAL HIGH (ref 36.0–46.0)
Hemoglobin: 15.5 g/dL — ABNORMAL HIGH (ref 12.0–15.0)
Lymphocytes Relative: 19.7 % (ref 12.0–46.0)
Lymphs Abs: 1.5 10*3/uL (ref 0.7–4.0)
MCHC: 33.3 g/dL (ref 30.0–36.0)
MCV: 90.1 fl (ref 78.0–100.0)
Monocytes Absolute: 0.6 10*3/uL (ref 0.1–1.0)
Monocytes Relative: 7.4 % (ref 3.0–12.0)
Neutro Abs: 5.5 10*3/uL (ref 1.4–7.7)
Neutrophils Relative %: 71.6 % (ref 43.0–77.0)
Platelets: 180 10*3/uL (ref 150.0–400.0)
RBC: 5.19 Mil/uL — ABNORMAL HIGH (ref 3.87–5.11)
RDW: 14.6 % (ref 11.5–15.5)
WBC: 7.7 10*3/uL (ref 4.0–10.5)

## 2022-06-15 LAB — IBC + FERRITIN
Ferritin: 138.7 ng/mL (ref 10.0–291.0)
Iron: 94 ug/dL (ref 42–145)
Saturation Ratios: 24.1 % (ref 20.0–50.0)
TIBC: 390.6 ug/dL (ref 250.0–450.0)
Transferrin: 279 mg/dL (ref 212.0–360.0)

## 2022-06-15 LAB — BASIC METABOLIC PANEL
BUN: 14 mg/dL (ref 6–23)
CO2: 27 mEq/L (ref 19–32)
Calcium: 10.1 mg/dL (ref 8.4–10.5)
Chloride: 103 mEq/L (ref 96–112)
Creatinine, Ser: 0.78 mg/dL (ref 0.40–1.20)
GFR: 72.36 mL/min (ref >60.00)
Glucose, Bld: 119 mg/dL — ABNORMAL HIGH (ref 70–99)
Potassium: 4 mEq/L (ref 3.5–5.1)
Sodium: 140 mEq/L (ref 135–145)

## 2022-06-15 MED ORDER — AMOXICILLIN-POT CLAVULANATE 875-125 MG PO TABS: 1.0000 | ORAL_TABLET | Freq: Two times a day (BID) | ORAL | 0 refills | Status: AC

## 2022-06-15 NOTE — Progress Notes (Signed)
Subjective:    [name], female    DOB: 07/07/1941, 81 y.o.   MRN: [id number]  HPI 81 year old female who  has a past medical history of Adenomatous colon polyp, Anxiety, Arthritis, Breast cancer ([place]) (08/08/13), Cataract, Chronic insomnia, Cluster headaches, Depression, Diverticulosis, Fatty liver (2011), Fibromyalgia, GERD (gastroesophageal reflux disease), H/O hiatal hernia, History of transfusion (08/30/2013), radiation therapy (10/29/13- 12/14/13), Hypothyroidism, Internal hemorrhoids, Irritable bowel syndrome, Kidney stone, Lymphedema, Macular degeneration, MDD (major depressive disorder), Osteopenia, Other abnormal glucose, Other and unspecified hyperlipidemia, Pain in joint, shoulder region, Pneumonia (5093,2671), Sleep apnea, and Stress incontinence, female.  She is being seen today for follow up regarding fatigue. She was seen a week ago and reported that her fatigue seems to be worse after she was placed on trospium chloride 60 mg. She stopped this medication as she did not notice any improvement. We also switched her Effexor to evening dose and had her cut back her lyrica to once daily dosing.   Today she reports that she had a day or two of feeling good after stopping tropsium but that she soon regressed to feeling lethargic and decreased appetitive.   She denies fevers, chills, vomiting, diarrhea, blood in stool.   Wt Readings from Last 3 Encounters:  06/15/22 167 lb (75.8 kg)  06/09/22 173 lb (78.5 kg)  05/27/22 164 lb (74.4 kg)    Review of Systems  Constitutional:  Positive for appetite change and fatigue.  Respiratory: Negative.    Cardiovascular: Negative.   Gastrointestinal: Negative.   Genitourinary: Negative.   Musculoskeletal: Negative.   Neurological:  Positive for weakness.  Hematological: Negative.    Past Medical History:  Diagnosis Date   Adenomatous colon polyp    Anxiety    PHOBIAS   Arthritis    Breast cancer ([place]) 08/08/13   right  LOQ   Cataract    Chronic insomnia    Cluster headaches    history of migraines / NONE FOR SEVERAL YRS   Depression    Diverticulosis    Fatty liver 2011   Fibromyalgia    GERD (gastroesophageal reflux disease)    H/O hiatal hernia    History of transfusion 08/30/2013   Hx of radiation therapy 10/29/13- 12/14/13   right chest wall 5040 cGy 28 sessions, right supraclavicular/axillary region 5040 cGy 28 sessions, right chest wall boost 1000 cGy 5 sessions   Hypothyroidism    Internal hemorrhoids    Irritable bowel syndrome    Kidney stone    Lymphedema    RT ARM - WEARS SLEEVE   Macular degeneration    hole/right eye   MDD (major depressive disorder)    Osteopenia    Other abnormal glucose    Other and unspecified hyperlipidemia    Pain in joint, shoulder region    Pneumonia 2458,0998   Sleep apnea    USES C-PAP   Stress incontinence, female     Social History   Socioeconomic History   Marital status: Married    Spouse name: Fritz Pickerel   Number of children: 3   Years of education: Not on file   Highest education level: Not on file  Occupational History   Occupation: retired bookkeeper  Tobacco Use   Smoking status: Former    Packs/day: 0.10    Years: 2.00    Total pack years: 0.20    Types: Cigarettes    Start date: 11/16/1959    Quit date: 11/15/1960    Years since quitting:  61.6   Smokeless tobacco: Never  Vaping Use   Vaping Use: Never used  Substance and Sexual Activity   Alcohol use: Yes    Comment: occ   Drug use: No   Sexual activity: Yes    Comment: menarche age 73, fist live birth 80, P 3, hysterectomy age 61, no HRT, BCP 2 yrs  Other Topics Concern   Not on file  Social History Narrative   Occupation:  Retired Radiation protection practitioner    Married with 3 grown children      Never Smoked     Alcohol use-no        Social Determinants of Health   Financial Resource Strain: Low Risk  (09/15/2021)   Overall Financial Resource Strain (CARDIA)    Difficulty of Paying  Living Expenses: Not hard at all  Food Insecurity: No Food Insecurity (09/15/2021)   Hunger Vital Sign    Worried About Running Out of Food in the Last Year: Never true    Ran Out of Food in the Last Year: Never true  Transportation Needs: No Transportation Needs (09/15/2021)   PRAPARE - Hydrologist (Medical): No    Lack of Transportation (Non-Medical): No  Physical Activity: Inactive (09/15/2021)   Exercise Vital Sign    Days of Exercise per Week: 0 days    Minutes of Exercise per Session: 30 min  Stress: Stress Concern Present (09/15/2021)   [place]    Feeling of Stress : To some extent  Social Connections: Socially Integrated (09/15/2021)   Social Connection and Isolation Panel [NHANES]    Frequency of Communication with Friends and Family: More than three times a week    Frequency of Social Gatherings with Friends and Family: Twice a week    Attends Religious Services: More than 4 times per year    Active Member of Clubs or Organizations: Yes    Attends Archivist Meetings: More than 4 times per year    Marital Status: Married  Human resources officer Violence: Not At Risk (07/22/2021)   Humiliation, Afraid, Rape, and Kick questionnaire    Fear of Current or Ex-Partner: No    Emotionally Abused: No    Physically Abused: No    Sexually Abused: No    Past Surgical History:  Procedure Laterality Date   ABDOMINAL HYSTERECTOMY     APPENDECTOMY     BILATERAL TOTAL MASTECTOMY WITH AXILLARY LYMPH NODE DISSECTION  08/30/2013   Dr Barry Dienes   BREAST CYST ASPIRATION     9 cysts   CATARACT EXTRACTION, BILATERAL  2005/2007   CHOLECYSTECTOMY     COLONOSCOPY     CYSTOCELE REPAIR     EVACUATION BREAST HEMATOMA Left 08/31/2013   Procedure: EVACUATION HEMATOMA BREAST;  Surgeon: Stark Klein, MD;  Location: [facility];  Service: General;  Laterality: Left;   EYE SURGERY     to repair macular hole    FOOT ARTHROPLASTY     lt    GANGLION CYST EXCISION     rt foot   HEMORRHOID SURGERY     03/1993   JOINT REPLACEMENT  03/15/11   left knee replacement   KNEE ARTHROSCOPY     /partial knee 2016/left knee 2012   MASS EXCISION  11/04/2011   Procedure: EXCISION MASS;  Surgeon: Cammie Sickle., MD;  Location: [facility];  Service: Orthopedics;  Laterality: Right;  excisional biopsy right ulna mass   MASTECTOMY  W/ SENTINEL NODE BIOPSY Right 08/30/2013   Procedure: RIGHT  AXILLARY SENTINEL LYMPH NODE BIOPSY; Right Axillary Node Disection;  Surgeon: Stark Klein, MD;  Location: [facility];  Service: General;  Laterality: Right;  Right side nuc med 7:00    PARTIAL KNEE ARTHROPLASTY Right 11/03/2015   Procedure: RIGHT KNEE MEDIAL UNICOMPARTMENTAL ARTHROPLASTY ;  Surgeon: Gaynelle Arabian, MD;  Location[facility];  Service: Orthopedics;  Laterality: Right;   RECTOCELE REPAIR     SIMPLE MASTECTOMY WITH AXILLARY SENTINEL NODE BIOPSY Left 08/30/2013   Procedure: Bilateral Breast Mastectomy ;  Surgeon: Stark Klein, MD;  Location: [facility];  Service: General;  Laterality: Left;   skin tags removed     breast, panty line, neckline   TOE SURGERY     preventative crossover toe surg/right foot   TOE SURGERY  2009   left foot/screw  in 2nd toe   TONSILLECTOMY     UPPER GASTROINTESTINAL ENDOSCOPY      Family History  Problem Relation Age of Onset   Stroke Mother        died age 49   Diabetes Mother    Breast cancer Mother 23   Breast cancer Sister 22   Breast cancer Paternal Aunt 93   Diabetes Maternal Grandfather    Breast cancer Paternal Grandmother 57   Breast cancer Paternal Aunt        dx in her 62s   Cancer Maternal Grandmother        intra-abdominal cancer   Brain cancer Maternal Uncle 8   Brain cancer Cousin 68       maternal cousin   Brain cancer Cousin 45       paternal cousin   Colon cancer Neg Hx     No Known Allergies  Current Outpatient Medications on File Prior to  Visit  Medication Sig Dispense Refill   Bacillus Coagulans-Inulin (PROBIOTIC) 1-250 BILLION-MG CAPS Take by mouth.     butalbital-acetaminophen-caffeine (FIORICET) 50-325-40 MG tablet Take 1 tablet by mouth as needed for headache. 30 tablet 2   Calcium Carbonate-Vitamin D (CALCIUM-VITAMIN D3 PO) Take 1 tablet by mouth in the morning and at bedtime.     Cholecalciferol (VITAMIN D3) 50 MCG (2000 UT) TABS Take 2 tablets by mouth daily.     diazepam (VALIUM) 5 MG tablet Take 5 mg by mouth every 6 (six) hours as needed for anxiety. As needed (flying & dental)     esomeprazole (NEXIUM) 40 MG capsule Take 1 capsule (40 mg total) by mouth daily at 12 noon. (Patient taking differently: Take 40 mg by mouth daily before breakfast.) 365 capsule 0   estradiol (ESTRACE) 0.1 MG/GM vaginal cream Place 0.5 g vaginally 2 (two) times a week. Place 0.5g nightly for two weeks then twice a week after 30 g 11   famotidine (PEPCID) 40 MG tablet TAKE ONE TABLET BY MOUTH EVERY NIGHT AT BEDTIME 90 tablet 1   levothyroxine (SYNTHROID) 100 MCG tablet TAKE ONE TABLET BY MOUTH DAILY 365 tablet 0   MELATONIN PO Take 1 tablet by mouth daily.     montelukast (SINGULAIR) 10 MG tablet Take 1 tablet (10 mg total) by mouth at bedtime. 90 tablet 1   omega-3 acid ethyl esters (LOVAZA) 1 g capsule TAKE ONE CAPSULE BY MOUTH TWICE A DAY 180 capsule 3   Polyethylene Glycol 3350 (MIRALAX PO) Take by mouth. 1/2 capful every morning     pregabalin (LYRICA) 150 MG capsule Take 1 capsule (150 mg total) by  mouth 2 (two) times daily. 180 capsule 0   rosuvastatin (CRESTOR) 20 MG tablet Take 1 tablet (20 mg total) by mouth daily. 90 tablet 2   topiramate (TOPAMAX) 25 MG tablet TAKE ONE TABLET BY MOUTH DAILY 325 tablet 0   venlafaxine XR (EFFEXOR-XR) 37.5 MG 24 hr capsule TAKE ONE CAPSULE BY MOUTH DAILY WITH BREAKFAST 90 capsule 1   No current facility-administered medications on file prior to visit.    BP 108/80   Pulse 81   Temp 98 F (36.7  C) (Oral)   Ht '4\' 11"'$  (1.499 m)   Wt 167 lb (75.8 kg)   SpO2 97%   BMI 33.73 kg/m       Objective:   Physical Exam Vitals and nursing note reviewed.  Constitutional:      Appearance: Normal appearance.  Cardiovascular:     Rate and Rhythm: Normal rate and regular rhythm.     Pulses: Normal pulses.     Heart sounds: Normal heart sounds.  Pulmonary:     Effort: Pulmonary effort is normal.     Breath sounds: Normal breath sounds.  Abdominal:     General: Abdomen is flat. Bowel sounds are normal.     Palpations: Abdomen is soft.  Skin:    General: Skin is warm and dry.     Capillary Refill: Capillary refill takes less than 2 seconds.  Neurological:     General: No focal deficit present.     Mental Status: She is alert and oriented to person, place, and time.  Psychiatric:        Mood and Affect: Mood normal.        Behavior: Behavior normal.        Thought Content: Thought content normal.        Judgment: Judgment normal.       Assessment & Plan:  1. Other fatigue - unknown cause, will check UA as she has a history of UTI's.  - TSH was normal less than a month ago  - Will recheck CBC, BMP, and Iron levels  - Culture, Urine; Future - Urinalysis; Future - Basic Metabolic Panel; Future - IBC + Ferritin; Future - CBC with Differential/Platelet; Future  2. Decreased appetite  - Culture, Urine; Future - Urinalysis; Future - Basic Metabolic Panel; Future - IBC + Ferritin; Future - CBC with Differential/Platelet; Future  [name], NP   Time of total for care on the day of the encounter 33 minutes. This includes time spent in both face-to-face and non-face-to-face activities including preparing for the visit, reviewing the chart, time spent with patient, evaluation  of her fatigue and loss of appetite. We discusses the many various causes of her symptoms.

## 2022-06-17 ENCOUNTER — Encounter: Payer: Self-pay | Admitting: Adult Health

## 2022-06-17 NOTE — Progress Notes (Deleted)
Office Visit Note  Patient: Jean Nash             Date of Birth: July 19, 1941           MRN: [id number]             PCP: [name], NP Referring: [name], NP Visit Date: 07/01/2022 Occupation: '@GUAROCC'$ @  Subjective:  No chief complaint on file.   History of Present Illness: Jean Nash is a 81 y.o. female ***   Activities of Daily Living:  Patient reports morning stiffness for *** {minute/hour:19697}.   Patient {ACTIONS;DENIES/REPORTS:21021675::"Denies"} nocturnal pain.  Difficulty dressing/grooming: {ACTIONS;DENIES/REPORTS:21021675::"Denies"} Difficulty climbing stairs: {ACTIONS;DENIES/REPORTS:21021675::"Denies"} Difficulty getting out of chair: {ACTIONS;DENIES/REPORTS:21021675::"Denies"} Difficulty using hands for taps, buttons, cutlery, and/or writing: {ACTIONS;DENIES/REPORTS:21021675::"Denies"}  No Rheumatology ROS completed.   PMFS History:  Patient Active Problem List   Diagnosis Date Noted   DDD (degenerative disc disease), cervical 12/29/2021   Genetic testing 08/05/2020   Fibromyalgia    MDD (major depressive disorder), recurrent, in full remission ([place]) 12/10/2019   Pulmonary fibrosis ([place]) 12/04/2018   MDD (major depressive disorder), recurrent episode, mild ([place]) 12/22/2017   Osteoporosis 07/14/2017   OA (osteoarthritis) of knee 11/03/2015   Osteopenia 08/08/2015   Headache 02/03/2015   Atypical chest pain 07/15/2014   Arthralgia 02/22/2014   Psoriasis 02/22/2014   Malignant neoplasm of lower-outer quadrant of right breast of female, estrogen receptor positive ([place]) 08/09/2013   Neck pain 06/22/2013   Left knee pain 11/29/2012   Reactive depression (situational) 04/26/2012   Right wrist pain 02/02/2012   Right foot pain 10/04/2011   Nevus 06/05/2011   Status post total knee replacement 04/06/2011   Overactive bladder 03/14/2011   KNEE PAIN, LEFT 08/31/2010   HEARING LOSS 08/17/2010   HIRSUTISM 06/16/2009   CYST, IDIOPATHIC  05/20/2008   IRRITABLE BOWEL SYNDROME 04/15/2008   Allergic asthma, mild intermittent, uncomplicated 66/59/9357   GERD 03/19/2008   COLONIC POLYPS, HX OF 03/19/2008   NEPHROLITHIASIS, HX OF 03/19/2008   Hypothyroidism 03/18/2008   Hyperlipidemia 03/18/2008   INSOMNIA, CHRONIC 03/18/2008   FIBROMYALGIA 03/18/2008   Prediabetes 03/18/2008    Past Medical History:  Diagnosis Date   Adenomatous colon polyp    Anxiety    PHOBIAS   Arthritis    Breast cancer ([place]) 08/08/13   right LOQ   Cataract    Chronic insomnia    Cluster headaches    history of migraines / NONE FOR SEVERAL YRS   Depression    Diverticulosis    Fatty liver 2011   Fibromyalgia    GERD (gastroesophageal reflux disease)    H/O hiatal hernia    History of transfusion 08/30/2013   Hx of radiation therapy 10/29/13- 12/14/13   right chest wall 5040 cGy 28 sessions, right supraclavicular/axillary region 5040 cGy 28 sessions, right chest wall boost 1000 cGy 5 sessions   Hypothyroidism    Internal hemorrhoids    Irritable bowel syndrome    Kidney stone    Lymphedema    RT ARM - WEARS SLEEVE   Macular degeneration    hole/right eye   MDD (major depressive disorder)    Osteopenia    Other abnormal glucose    Other and unspecified hyperlipidemia    Pain in joint, shoulder region    Pneumonia 0177,9390   Sleep apnea    USES C-PAP   Stress incontinence, female     Family History  Problem Relation Age of Onset   Stroke Mother  died age 41   Diabetes Mother    Breast cancer Mother 72   Breast cancer Sister 28   Breast cancer Paternal Aunt 92   Diabetes Maternal Grandfather    Breast cancer Paternal Grandmother 64   Breast cancer Paternal Aunt        dx in her 68s   Cancer Maternal Grandmother        intra-abdominal cancer   Brain cancer Maternal Uncle 8   Brain cancer Cousin 64       maternal cousin   Brain cancer Cousin 37       paternal cousin   Colon cancer Neg Hx    Past Surgical History:   Procedure Laterality Date   ABDOMINAL HYSTERECTOMY     APPENDECTOMY     BILATERAL TOTAL MASTECTOMY WITH AXILLARY LYMPH NODE DISSECTION  08/30/2013   Dr Barry Dienes   BREAST CYST ASPIRATION     9 cysts   CATARACT EXTRACTION, BILATERAL  2005/2007   CHOLECYSTECTOMY     COLONOSCOPY     CYSTOCELE REPAIR     EVACUATION BREAST HEMATOMA Left 08/31/2013   Procedure: EVACUATION HEMATOMA BREAST;  Surgeon: Stark Klein, MD;  Location: [facility];  Service: General;  Laterality: Left;   EYE SURGERY     to repair macular hole   FOOT ARTHROPLASTY     lt    GANGLION CYST EXCISION     rt foot   HEMORRHOID SURGERY     03/1993   JOINT REPLACEMENT  03/15/11   left knee replacement   KNEE ARTHROSCOPY     /partial knee 2016/left knee 2012   MASS EXCISION  11/04/2011   Procedure: EXCISION MASS;  Surgeon: Cammie Sickle., MD;  Location: [facility];  Service: Orthopedics;  Laterality: Right;  excisional biopsy right ulna mass   MASTECTOMY W/ SENTINEL NODE BIOPSY Right 08/30/2013   Procedure: RIGHT  AXILLARY SENTINEL LYMPH NODE BIOPSY; Right Axillary Node Disection;  Surgeon: Stark Klein, MD;  Location: [facility];  Service: General;  Laterality: Right;  Right side nuc med 7:00    PARTIAL KNEE ARTHROPLASTY Right 11/03/2015   Procedure: RIGHT KNEE MEDIAL UNICOMPARTMENTAL ARTHROPLASTY ;  Surgeon: Gaynelle Arabian, MD;  Location: [facility];  Service: Orthopedics;  Laterality: Right;   RECTOCELE REPAIR     SIMPLE MASTECTOMY WITH AXILLARY SENTINEL NODE BIOPSY Left 08/30/2013   Procedure: Bilateral Breast Mastectomy ;  Surgeon: Stark Klein, MD;  Location: [facility];  Service: General;  Laterality: Left;   skin tags removed     breast, panty line, neckline   TOE SURGERY     preventative crossover toe surg/right foot   TOE SURGERY  2009   left foot/screw  in 2nd toe   TONSILLECTOMY     UPPER GASTROINTESTINAL ENDOSCOPY     Social History   Social History Narrative   Occupation:  Retired Radiation protection practitioner     Married with 3 grown children      Never Smoked     Alcohol use-no        Immunization History  Administered Date(s) Administered   Fluad Quad(high Dose 65+) 07/13/2019, 10/01/2020, 07/31/2021   Influenza Split 07/27/2012   Influenza Whole 09/13/2007, 08/08/2008, 07/30/2009, 08/25/2010   Influenza, High Dose Seasonal PF 08/03/2014, 08/08/2015, 08/23/2016, 07/14/2017, 07/20/2018   Influenza,inj,Quad PF,6+ Mos 07/25/2013   PFIZER Comirnaty(Gray Top)Covid-19 Tri-Sucrose Vaccine 02/18/2021, 09/07/2021   PFIZER(Purple Top)SARS-COV-2 Vaccination 12/23/2019, 01/17/2020, 08/18/2020   Pneumococcal Conjugate-13 08/16/2013   Pneumococcal Polysaccharide-23 06/18/2008  Tdap 11/29/2012   Zoster Recombinat (Shingrix) 04/11/2018, 09/22/2018   Zoster, Live 08/05/2011     Objective: Vital Signs: There were no vitals taken for this visit.   Physical Exam   Musculoskeletal Exam: ***  CDAI Exam: CDAI Score: -- Patient Global: --; Provider Global: -- Swollen: --; Tender: -- Joint Exam 07/01/2022   No joint exam has been documented for this visit   There is currently no information documented on the homunculus. Go to the Rheumatology activity and complete the homunculus joint exam.  Investigation: No additional findings.  Imaging: DG Knee 1-2 Views Left  Result Date: 05/26/2022 CLINICAL DATA:  Chronic knee pain.  Status post repair. EXAM: LEFT KNEE - 1-2 VIEW; RIGHT KNEE - 1-2 VIEW COMPARISON:  Left femur radiographs 09/03/2016 FINDINGS: Left knee: Status post total left knee arthroplasty. No perihardware lucency is seen to indicate hardware failure or loosening. No knee joint effusion. No acute fracture or dislocation. Right knee: Status post medial unicompartmental arthroplasty. No perihardware lucency is seen to indicate hardware failure or loosening. Mild lateral compartment joint space narrowing and peripheral osteophytosis. Moderate patellofemoral joint space narrowing and peripheral  osteophytosis. No joint effusion. No acute fracture or dislocation. Mild chronic enthesopathic change at the quadriceps insertion on the patella. IMPRESSION: 1. Status post total left knee arthroplasty and right knee medial unicompartmental arthroplasty. No evidence of hardware failure. 2. Right knee mild lateral compartment and moderate patellofemoral compartment osteoarthritis. Electronically Signed   By: Yvonne Kendall M.D.   On: 05/26/2022 12:02   DG Knee 1-2 Views Right  Result Date: 05/26/2022 CLINICAL DATA:  Chronic knee pain.  Status post repair. EXAM: LEFT KNEE - 1-2 VIEW; RIGHT KNEE - 1-2 VIEW COMPARISON:  Left femur radiographs 09/03/2016 FINDINGS: Left knee: Status post total left knee arthroplasty. No perihardware lucency is seen to indicate hardware failure or loosening. No knee joint effusion. No acute fracture or dislocation. Right knee: Status post medial unicompartmental arthroplasty. No perihardware lucency is seen to indicate hardware failure or loosening. Mild lateral compartment joint space narrowing and peripheral osteophytosis. Moderate patellofemoral joint space narrowing and peripheral osteophytosis. No joint effusion. No acute fracture or dislocation. Mild chronic enthesopathic change at the quadriceps insertion on the patella. IMPRESSION: 1. Status post total left knee arthroplasty and right knee medial unicompartmental arthroplasty. No evidence of hardware failure. 2. Right knee mild lateral compartment and moderate patellofemoral compartment osteoarthritis. Electronically Signed   By: Yvonne Kendall M.D.   On: 05/26/2022 12:02    Recent Labs: Lab Results  Component Value Date   WBC 7.7 06/15/2022   HGB 15.5 (H) 06/15/2022   PLT 180.0 06/15/2022   NA 140 06/15/2022   K 4.0 06/15/2022   CL 103 06/15/2022   CO2 27 06/15/2022   GLUCOSE 119 (H) 06/15/2022   BUN 14 06/15/2022   CREATININE 0.78 06/15/2022   BILITOT 0.6 05/25/2022   ALKPHOS 58 05/25/2022   AST 17 05/25/2022    ALT 20 05/25/2022   PROT 7.0 05/25/2022   ALBUMIN 4.7 05/25/2022   CALCIUM 10.1 06/15/2022   GFRAA 77 09/12/2020    Speciality Comments: No specialty comments available.  Procedures:  No procedures performed Allergies: Patient has no known allergies.   Assessment / Plan:     Visit Diagnoses: No diagnosis found.  Orders: No orders of the defined types were placed in this encounter.  No orders of the defined types were placed in this encounter.   Face-to-face time spent with patient was *** minutes.  Greater than 50% of time was spent in counseling and coordination of care.  Follow-Up Instructions: No follow-ups on file.   Earnestine Mealing, CMA  Note - This record has been created using Editor, commissioning.  Chart creation errors have been sought, but may not always  have been located. Such creation errors do not reflect on  the standard of medical care.

## 2022-06-17 NOTE — Telephone Encounter (Signed)
Please advise 

## 2022-06-18 LAB — URINE CULTURE
MICRO NUMBER:: 13720493
SPECIMEN QUALITY:: ADEQUATE

## 2022-06-21 ENCOUNTER — Encounter: Payer: Self-pay | Admitting: Adult Health

## 2022-06-22 NOTE — Telephone Encounter (Signed)
Please advise 

## 2022-06-30 ENCOUNTER — Encounter: Payer: Self-pay | Admitting: Adult Health

## 2022-06-30 ENCOUNTER — Ambulatory Visit (INDEPENDENT_AMBULATORY_CARE_PROVIDER_SITE_OTHER): Payer: Medicare HMO | Admitting: Adult Health

## 2022-06-30 VITALS — BP 100/50 | HR 98 | Temp 98.0°F | Ht 59.0 in | Wt 167.0 lb

## 2022-06-30 DIAGNOSIS — F339 Major depressive disorder, recurrent, unspecified: Secondary | ICD-10-CM | POA: Diagnosis not present

## 2022-06-30 DIAGNOSIS — N3001 Acute cystitis with hematuria: Secondary | ICD-10-CM

## 2022-06-30 LAB — POCT URINALYSIS DIPSTICK
Bilirubin, UA: NEGATIVE
Blood, UA: NEGATIVE
Glucose, UA: NEGATIVE
Ketones, UA: NEGATIVE
Leukocytes, UA: NEGATIVE
Nitrite, UA: NEGATIVE
Protein, UA: NEGATIVE
Spec Grav, UA: 1.01 (ref 1.010–1.025)
Urobilinogen, UA: 0.2 E.U./dL
pH, UA: 7 (ref 5.0–8.0)

## 2022-06-30 NOTE — Progress Notes (Signed)
Subjective:    [name], female    DOB: 08/07/1941, 81 y.o.   MRN: [id number]  HPI 81 year old female who  has a past medical history of Adenomatous colon polyp, Anxiety, Arthritis, Breast cancer ([place]) (08/08/13), Cataract, Chronic insomnia, Cluster headaches, Depression, Diverticulosis, Fatty liver (2011), Fibromyalgia, GERD (gastroesophageal reflux disease), H/O hiatal hernia, History of transfusion (08/30/2013), radiation therapy (10/29/13- 12/14/13), Hypothyroidism, Internal hemorrhoids, Irritable bowel syndrome, Kidney stone, Lymphedema, Macular degeneration, MDD (major depressive disorder), Osteopenia, Other abnormal glucose, Other and unspecified hyperlipidemia, Pain in joint, shoulder region, Pneumonia ([id number]), Sleep apnea, and Stress incontinence, female.  She presents to the office today for follow-up regarding recurrent urinary tract infections.  Most currently she had a recurrent UTI with E. coli bacteria.  He was most recently placed on a 14-day course of Augmentin due to recurrent nature with back-to-back UTIs.  Today she reports that she is back to her usual state of health but has only recently over the last couple of days started to really feel good until today... When she started to feel fatigued and shaky again.  This is causing depression because she wants to feel better.  She has been worked up in the past for vague complaints which showed nothing abnormal.  She would like to repeat her Culture and UA today to make sure the UTI is completely gone.    Review of Systems See HPI   Past Medical History:  Diagnosis Date   Adenomatous colon polyp    Anxiety    PHOBIAS   Arthritis    Breast cancer ([place]) 08/08/13   right LOQ   Cataract    Chronic insomnia    Cluster headaches    history of migraines / NONE FOR SEVERAL YRS   Depression    Diverticulosis    Fatty liver 2011   Fibromyalgia    GERD (gastroesophageal reflux disease)    H/O hiatal hernia     History of transfusion 08/30/2013   Hx of radiation therapy 10/29/13- 12/14/13   right chest wall 5040 cGy 28 sessions, right supraclavicular/axillary region 5040 cGy 28 sessions, right chest wall boost 1000 cGy 5 sessions   Hypothyroidism    Internal hemorrhoids    Irritable bowel syndrome    Kidney stone    Lymphedema    RT ARM - WEARS SLEEVE   Macular degeneration    hole/right eye   MDD (major depressive disorder)    Osteopenia    Other abnormal glucose    Other and unspecified hyperlipidemia    Pain in joint, shoulder region    Pneumonia 0177,9390   Sleep apnea    USES C-PAP   Stress incontinence, female     Social History   Socioeconomic History   Marital status: Married    Spouse name: Fritz Pickerel   Number of children: 3   Years of education: Not on file   Highest education level: Not on file  Occupational History   Occupation: retired bookkeeper  Tobacco Use   Smoking status: Former    Packs/day: 0.10    Years: 2.00    Total pack years: 0.20    Types: Cigarettes    Start date: 11/16/1959    Quit date: 11/15/1960    Years since quitting: 61.6   Smokeless tobacco: Never  Vaping Use   Vaping Use: Never used  Substance and Sexual Activity   Alcohol use: Yes    Comment: occ   Drug use: No   Sexual  activity: Yes    Comment: menarche age 71, fist live birth 89, P 3, hysterectomy age 48, no HRT, BCP 2 yrs  Other Topics Concern   Not on file  Social History Narrative   Occupation:  Retired Radiation protection practitioner    Married with 3 grown children      Never Smoked     Alcohol use-no        Social Determinants of Health   Financial Resource Strain: Low Risk  (09/15/2021)   Overall Financial Resource Strain (CARDIA)    Difficulty of Paying Living Expenses: Not hard at all  Food Insecurity: No Food Insecurity (09/15/2021)   Hunger Vital Sign    Worried About Running Out of Food in the Last Year: Never true    Ran Out of Food in the Last Year: Never true  Transportation Needs:  No Transportation Needs (09/15/2021)   PRAPARE - Hydrologist (Medical): No    Lack of Transportation (Non-Medical): No  Physical Activity: Inactive (09/15/2021)   Exercise Vital Sign    Days of Exercise per Week: 0 days    Minutes of Exercise per Session: 30 min  Stress: Stress Concern Present (09/15/2021)   [place]    Feeling of Stress : To some extent  Social Connections: Socially Integrated (09/15/2021)   Social Connection and Isolation Panel [NHANES]    Frequency of Communication with Friends and Family: More than three times a week    Frequency of Social Gatherings with Friends and Family: Twice a week    Attends Religious Services: More than 4 times per year    Active Member of Clubs or Organizations: Yes    Attends Archivist Meetings: More than 4 times per year    Marital Status: Married  Human resources officer Violence: Not At Risk (07/22/2021)   Humiliation, Afraid, Rape, and Kick questionnaire    Fear of Current or Ex-Partner: No    Emotionally Abused: No    Physically Abused: No    Sexually Abused: No    Past Surgical History:  Procedure Laterality Date   ABDOMINAL HYSTERECTOMY     APPENDECTOMY     BILATERAL TOTAL MASTECTOMY WITH AXILLARY LYMPH NODE DISSECTION  08/30/2013   Dr Barry Dienes   BREAST CYST ASPIRATION     9 cysts   CATARACT EXTRACTION, BILATERAL  2005/2007   CHOLECYSTECTOMY     COLONOSCOPY     CYSTOCELE REPAIR     EVACUATION BREAST HEMATOMA Left 08/31/2013   Procedure: EVACUATION HEMATOMA BREAST;  Surgeon: Stark Klein, MD;  Location: [facility];  Service: General;  Laterality: Left;   EYE SURGERY     to repair macular hole   FOOT ARTHROPLASTY     lt    GANGLION CYST EXCISION     rt foot   HEMORRHOID SURGERY     03/1993   JOINT REPLACEMENT  03/15/11   left knee replacement   KNEE ARTHROSCOPY     /partial knee 2016/left knee 2012   MASS EXCISION  11/04/2011    Procedure: EXCISION MASS;  Surgeon: Cammie Sickle., MD;  Location: [facility];  Service: Orthopedics;  Laterality: Right;  excisional biopsy right ulna mass   MASTECTOMY W/ SENTINEL NODE BIOPSY Right 08/30/2013   Procedure: RIGHT  AXILLARY SENTINEL LYMPH NODE BIOPSY; Right Axillary Node Disection;  Surgeon: Stark Klein, MD;  Location: [facility];  Service: General;  Laterality: Right;  Right side  nuc med 7:00    PARTIAL KNEE ARTHROPLASTY Right 11/03/2015   Procedure: RIGHT KNEE MEDIAL UNICOMPARTMENTAL ARTHROPLASTY ;  Surgeon: Gaynelle Arabian, MD;  Location[facility];  Service: Orthopedics;  Laterality: Right;   RECTOCELE REPAIR     SIMPLE MASTECTOMY WITH AXILLARY SENTINEL NODE BIOPSY Left 08/30/2013   Procedure: Bilateral Breast Mastectomy ;  Surgeon: Stark Klein, MD;  Location: [facility];  Service: General;  Laterality: Left;   skin tags removed     breast, panty line, neckline   TOE SURGERY     preventative crossover toe surg/right foot   TOE SURGERY  2009   left foot/screw  in 2nd toe   TONSILLECTOMY     UPPER GASTROINTESTINAL ENDOSCOPY      Family History  Problem Relation Age of Onset   Stroke Mother        died age 62   Diabetes Mother    Breast cancer Mother 59   Breast cancer Sister 32   Breast cancer Paternal Aunt 71   Diabetes Maternal Grandfather    Breast cancer Paternal Grandmother 74   Breast cancer Paternal Aunt        dx in her 39s   Cancer Maternal Grandmother        intra-abdominal cancer   Brain cancer Maternal Uncle 8   Brain cancer Cousin 43       maternal cousin   Brain cancer Cousin 66       paternal cousin   Colon cancer Neg Hx     No Known Allergies  Current Outpatient Medications on File Prior to Visit  Medication Sig Dispense Refill   Bacillus Coagulans-Inulin (PROBIOTIC) 1-250 BILLION-MG CAPS Take by mouth.     butalbital-acetaminophen-caffeine (FIORICET) 50-325-40 MG tablet Take 1 tablet by mouth as needed for headache. 30 tablet 2    Calcium Carbonate-Vitamin D (CALCIUM-VITAMIN D3 PO) Take 1 tablet by mouth in the morning and at bedtime.     Cholecalciferol (VITAMIN D3) 50 MCG (2000 UT) TABS Take 2 tablets by mouth daily.     diazepam (VALIUM) 5 MG tablet Take 5 mg by mouth every 6 (six) hours as needed for anxiety. As needed (flying & dental)     esomeprazole (NEXIUM) 40 MG capsule Take 1 capsule (40 mg total) by mouth daily at 12 noon. (Patient taking differently: Take 40 mg by mouth daily before breakfast.) 365 capsule 0   estradiol (ESTRACE) 0.1 MG/GM vaginal cream Place 0.5 g vaginally 2 (two) times a week. Place 0.5g nightly for two weeks then twice a week after 30 g 11   famotidine (PEPCID) 40 MG tablet TAKE ONE TABLET BY MOUTH EVERY NIGHT AT BEDTIME 90 tablet 1   levothyroxine (SYNTHROID) 100 MCG tablet TAKE ONE TABLET BY MOUTH DAILY 365 tablet 0   MELATONIN PO Take 1 tablet by mouth daily.     montelukast (SINGULAIR) 10 MG tablet Take 1 tablet (10 mg total) by mouth at bedtime. 90 tablet 1   omega-3 acid ethyl esters (LOVAZA) 1 g capsule TAKE ONE CAPSULE BY MOUTH TWICE A DAY 180 capsule 3   Polyethylene Glycol 3350 (MIRALAX PO) Take by mouth. 1/2 capful every morning     pregabalin (LYRICA) 150 MG capsule Take 1 capsule (150 mg total) by mouth 2 (two) times daily. 180 capsule 0   rosuvastatin (CRESTOR) 20 MG tablet Take 1 tablet (20 mg total) by mouth daily. 90 tablet 2   topiramate (TOPAMAX) 25 MG tablet TAKE ONE TABLET BY  MOUTH DAILY 325 tablet 0   venlafaxine XR (EFFEXOR-XR) 37.5 MG 24 hr capsule TAKE ONE CAPSULE BY MOUTH DAILY WITH BREAKFAST 90 capsule 1   No current facility-administered medications on file prior to visit.    There were no vitals taken for this visit.      Objective:   Physical Exam Vitals and nursing note reviewed.  Constitutional:      Appearance: Normal appearance.  Pulmonary:     Effort: Pulmonary effort is normal.     Breath sounds: Normal breath sounds.  Abdominal:      General: Abdomen is flat. Bowel sounds are normal.     Palpations: Abdomen is soft.  Musculoskeletal:        General: Normal range of motion.  Skin:    General: Skin is warm and dry.  Neurological:     General: No focal deficit present.     Mental Status: She is alert and oriented to person, place, and time.  Psychiatric:        Mood and Affect: Mood normal.        Behavior: Behavior normal.        Thought Content: Thought content normal.        Judgment: Judgment normal.        Assessment & Plan:  1. Acute cystitis with hematuria  - POC Urinalysis Dipstick- negative  - if culture comes back negative then will start on daily Macrobid therapy  - Culture, Urine  2. Depression, recurrent ([place]) -I do think a lot of her generalized complaints seem to be more psychosomatic, and in conversation today she and her husband do not disagree with this.  She is interested in seeing a psychiatrist.  Will refer to behavioral health for further evaluation. [facility] Office Visit from 06/30/2022 in [facility]  PHQ-9 Total Score 11      - Ambulatory referral to Psychiatry  [name], NP

## 2022-07-01 ENCOUNTER — Encounter: Payer: Self-pay | Admitting: Adult Health

## 2022-07-01 ENCOUNTER — Ambulatory Visit: Payer: Medicare HMO | Admitting: Physician Assistant

## 2022-07-01 DIAGNOSIS — F330 Major depressive disorder, recurrent, mild: Secondary | ICD-10-CM

## 2022-07-01 DIAGNOSIS — M5030 Other cervical disc degeneration, unspecified cervical region: Secondary | ICD-10-CM

## 2022-07-01 DIAGNOSIS — C50511 Malignant neoplasm of lower-outer quadrant of right female breast: Secondary | ICD-10-CM

## 2022-07-01 DIAGNOSIS — Z8719 Personal history of other diseases of the digestive system: Secondary | ICD-10-CM

## 2022-07-01 DIAGNOSIS — J8410 Pulmonary fibrosis, unspecified: Secondary | ICD-10-CM

## 2022-07-01 DIAGNOSIS — Z8639 Personal history of other endocrine, nutritional and metabolic disease: Secondary | ICD-10-CM

## 2022-07-01 DIAGNOSIS — R7303 Prediabetes: Secondary | ICD-10-CM

## 2022-07-01 DIAGNOSIS — Z96651 Presence of right artificial knee joint: Secondary | ICD-10-CM

## 2022-07-01 DIAGNOSIS — Z87442 Personal history of urinary calculi: Secondary | ICD-10-CM

## 2022-07-01 DIAGNOSIS — M19041 Primary osteoarthritis, right hand: Secondary | ICD-10-CM

## 2022-07-01 DIAGNOSIS — M5136 Other intervertebral disc degeneration, lumbar region: Secondary | ICD-10-CM

## 2022-07-01 DIAGNOSIS — Z86010 Personal history of colonic polyps: Secondary | ICD-10-CM

## 2022-07-01 DIAGNOSIS — M797 Fibromyalgia: Secondary | ICD-10-CM

## 2022-07-01 DIAGNOSIS — J4520 Mild intermittent asthma, uncomplicated: Secondary | ICD-10-CM

## 2022-07-01 DIAGNOSIS — Z96652 Presence of left artificial knee joint: Secondary | ICD-10-CM

## 2022-07-02 ENCOUNTER — Other Ambulatory Visit: Payer: Self-pay | Admitting: Adult Health

## 2022-07-02 ENCOUNTER — Telehealth: Payer: Self-pay | Admitting: Pharmacist

## 2022-07-02 LAB — URINE CULTURE
MICRO NUMBER:: 13787890
Result:: NO GROWTH
SPECIMEN QUALITY:: ADEQUATE

## 2022-07-02 MED ORDER — NITROFURANTOIN MONOHYD MACRO 100 MG PO CAPS: 100.0000 mg | ORAL_CAPSULE | Freq: Every day | ORAL | 3 refills | Status: DC

## 2022-07-02 NOTE — Chronic Care Management (AMB) (Signed)
Chronic Care Management Pharmacy Assistant   Name: Kaavya Puskarich  MRN: [id number] DOB: Sep 26, 1941  Reason for Encounter: Disease State   Conditions to be addressed/monitored: General Assessment  Recent office visits:  06/30/22 [name], NP - Patient presented for Acute cystitis with hematuria and other concerns. No medication changes.  06/15/22 [name], NP - Patient presented for Other Fatigue and other concerns. Stopped Trospium Chloride.  06/09/22  [name], NP - Patient presented for Other Fatigue and other concerns. Stopped Simvastatin.  05/27/22 [name], NP - Patient presented for Frequent urination and other concerns. Prescribed Ciprofloxacin.  05/25/22 [name], NP - Patient presented for Routine General Medical exam at a health care facility and other concerns. Prescribed Rosuvastatin. Stopped Cyclobenzaprine.  03/18/22 [name], NP - Patient presented for Generalized weakness. Stopped Hydrocodone. Stopped Oxycodone. Stopped Prednisone.  03/03/22 [name], NP - Patient presented for Other Fatigue and other concerns. No medication changes.  02/24/22 Billie Ruddy, MD - Patient presented for Pain in right hand and other concerns. Prescribed Prednisone.  02/16/22 [name], NP - Patient presented for Glucose intolerance and other concerns Prescribed Montelukast sodium. Stopped Multi Vitamin.  Recent consult visits:  06/01/22 Jaquita Folds, MD - ( Urogynecology) Patient presented for Overactive bladder and other concerns. Prescribed Estradiol. Increased Trospium Chloride.  04/01/22 Jaquita Folds, MD - ( Urogynecology) Patient presented for Urinary frequency and other concerns. Prescribed Trospium Chloride.  03/04/22 Levin Erp, PA - [facility]) Patient presented for Nonspecific abnormal finding in stool contents. No medication changes.   Hospital visits:  Medication Reconciliation was completed by comparing  discharge summary, patient's EMR and Pharmacy list, and upon discussion with patient. Patient presented to [facility] on 03/15/22 due to Weakness. Patient was present for 2 hours.  New?Medications Started at [facility] Discharge:?? -started none  Medication Changes at Hospital Discharge: -Changed  none  Medications Discontinued at Hospital Discharge: -Stopped  none  Medications that remain the same after Hospital Discharge:??  -All other medications will remain the same.    Medications: Outpatient Encounter Medications as of 07/02/2022  Medication Sig Note   Bacillus Coagulans-Inulin (PROBIOTIC) 1-250 BILLION-MG CAPS Take by mouth.    butalbital-acetaminophen-caffeine (FIORICET) 50-325-40 MG tablet Take 1 tablet by mouth as needed for headache.    Calcium Carbonate-Vitamin D (CALCIUM-VITAMIN D3 PO) Take 1 tablet by mouth in the morning and at bedtime. 05/16/2020: 200 units/ 67mg tablet   Cholecalciferol (VITAMIN D3) 50 MCG (2000 UT) TABS Take 2 tablets by mouth daily.    diazepam (VALIUM) 5 MG tablet Take 5 mg by mouth every 6 (six) hours as needed for anxiety. As needed (flying & dental)    esomeprazole (NEXIUM) 40 MG capsule Take 1 capsule (40 mg total) by mouth daily at 12 noon. (Patient taking differently: Take 40 mg by mouth daily before breakfast.)    estradiol (ESTRACE) 0.1 MG/GM vaginal cream Place 0.5 g vaginally 2 (two) times a week. Place 0.5g nightly for two weeks then twice a week after    famotidine (PEPCID) 40 MG tablet TAKE ONE TABLET BY MOUTH EVERY NIGHT AT BEDTIME    levothyroxine (SYNTHROID) 100 MCG tablet TAKE ONE TABLET BY MOUTH DAILY    MELATONIN PO Take 1 tablet by mouth daily.    montelukast (SINGULAIR) 10 MG tablet Take 1 tablet (10 mg total) by mouth at bedtime.    nitrofurantoin, macrocrystal-monohydrate, (MACROBID) 100 MG capsule Take 1 capsule (100 mg total) by mouth at  bedtime.    omega-3 acid ethyl esters (LOVAZA) 1 g capsule TAKE ONE CAPSULE  BY MOUTH TWICE A DAY    Polyethylene Glycol 3350 (MIRALAX PO) Take by mouth. 1/2 capful every morning    pregabalin (LYRICA) 150 MG capsule Take 1 capsule (150 mg total) by mouth 2 (two) times daily.    rosuvastatin (CRESTOR) 20 MG tablet Take 1 tablet (20 mg total) by mouth daily.    topiramate (TOPAMAX) 25 MG tablet TAKE ONE TABLET BY MOUTH DAILY    venlafaxine XR (EFFEXOR-XR) 37.5 MG 24 hr capsule TAKE ONE CAPSULE BY MOUTH DAILY WITH BREAKFAST    No facility-administered encounter medications on file as of 07/02/2022.   Contacted [name] for General Review Call  Adherence Review:  Does the Clinical Pharmacist Assistant have access to adherence rates? No Adherence rates for STAR metric medications  Rosuvastatin 20 Mg- Last filled 05/31/22 [facility] Does the patient have >5 day gap between last estimated fill dates for any of the above medications or other medication gaps? No    Disease State Questions:  Able to connect with Patient? Spoke to patients husband Did patient have any problems with their health recently? No Patient has recently seen PCP for UTI Have you had any admissions or emergency room visits or worsening of your condition(s) since last visit? No  Have you had any visits with new specialists or providers since your last visit? No  Have you had any new health care problem(s) since your last visit? No  Have you run out of any of your medications since you last spoke with clinical pharmacist? No  Are there any medications you are not taking as prescribed? No What kept you from taking your medications as prescribed? Are you having any issues or side effects with your medications? No  Do you have any other health concerns or questions you want to discuss with your Clinical Pharmacist before your next visit? No  Are there any health concerns that you feel we can do a better job addressing? No  Are you having any problems with any of the following  since the last visit: (select all that apply)  None  12. Any falls since last visit? No  Details: 13. Any increased or uncontrolled pain since last visit? None  Husband reports he has picked up prescription from [facility] for Antibiotic for patient. No needs or concerns at this time.  Care Gaps: Flu Vaccine - Overdue CCM- declined at this time AWV- 9/22  Star Rating Drugs: Rosuvastatin 20 Mg- Last filled 05/31/22 [facility] Verified    [facility] Clinical Pharmacist Assistant [phone]

## 2022-07-10 ENCOUNTER — Encounter: Payer: Self-pay | Admitting: Adult Health

## 2022-07-12 NOTE — Progress Notes (Unsigned)
Office Visit Note  Patient: Jean Nash             Date of Birth: 11/04/1941           MRN: [id number]             PCP: Jean Peng, NP Referring: Jean Peng, NP Visit Date: 07/26/2022 Occupation: '@GUAROCC'$ @  Subjective:  Pain in both knee replacements   History of Present Illness: Jean Nash is a 81 y.o. female with history of fibromyalgia, osteoarthritis, and DDD.  Patient continues to experience intermittent myalgias and muscle tenderness due to fibromyalgia.  Overall her symptoms have been manageable taking Lyrica, Topamax, Effexor as prescribed.  She has been having some increased discomfort in both knee replacements especially her left knee.  She has been exercising in the pool 1 to 2 hours a day which has been helpful but this past weekend she was experiencing increased discomfort in the left knee even while walking in the pool.  She had updated x-rays of both knees on 05/25/22 ordered by her PCP.  She plans on scheduling a follow-up visit with her orthopedist Dr. Reynaldo Nash for further evaluation. She has intermittent discomfort and stiffness in both CMC joints.  She has difficulty knitting due to the discomfort.  Her symptoms have been less frequent.  She uses voltaren gel topically as needed for pain relief.  She has been under the care of her PCP for recurrent UTIs.  She denies any signs or symptoms of UTI currently. She is not currently on antibiotics.   Activities of Daily Living:  Patient reports morning stiffness for several hours.   Patient Denies nocturnal pain.  Difficulty dressing/grooming: Denies Difficulty climbing stairs: Reports Difficulty getting out of chair: Denies Difficulty using hands for taps, buttons, cutlery, and/or writing: Denies  Review of Systems  Constitutional:  Positive for fatigue.  HENT:  Negative for mouth sores and mouth dryness.   Eyes:  Positive for dryness.  Respiratory:  Negative for shortness of breath.   Cardiovascular:   Negative for chest pain and palpitations.  Gastrointestinal:  Positive for constipation and diarrhea. Negative for blood in stool.  Endocrine: Negative for increased urination.  Genitourinary:  Positive for decreased urine output. Negative for involuntary urination.  Musculoskeletal:  Positive for joint pain, joint pain, joint swelling and morning stiffness. Negative for gait problem, myalgias, muscle weakness, muscle tenderness and myalgias.  Skin:  Negative for color change, rash, hair loss and sensitivity to sunlight.  Allergic/Immunologic: Negative for susceptible to infections.  Neurological:  Positive for headaches. Negative for dizziness.  Hematological:  Negative for swollen glands.  Psychiatric/Behavioral:  Positive for sleep disturbance. Negative for depressed mood. The patient is not nervous/anxious.     PMFS History:  Patient Active Problem List   Diagnosis Date Noted   DDD (degenerative disc disease), cervical 12/29/2021   Genetic testing 08/05/2020   Fibromyalgia    MDD (major depressive disorder), recurrent, in full remission ([place]) 12/10/2019   Pulmonary fibrosis ([place]) 12/04/2018   MDD (major depressive disorder), recurrent episode, mild ([place]) 12/22/2017   Osteoporosis 07/14/2017   OA (osteoarthritis) of knee 11/03/2015   Osteopenia 08/08/2015   Headache 02/03/2015   Atypical chest pain 07/15/2014   Arthralgia 02/22/2014   Psoriasis 02/22/2014   Malignant neoplasm of lower-outer quadrant of right breast of female, estrogen receptor positive ([place]) 08/09/2013   Neck pain 06/22/2013   Left knee pain 11/29/2012   Reactive depression (situational) 04/26/2012   Right wrist pain 02/02/2012  Right foot pain 10/04/2011   Nevus 06/05/2011   Status post total knee replacement 04/06/2011   Overactive bladder 03/14/2011   KNEE PAIN, LEFT 08/31/2010   HEARING LOSS 08/17/2010   HIRSUTISM 06/16/2009   CYST, IDIOPATHIC 05/20/2008   IRRITABLE BOWEL SYNDROME 04/15/2008    Allergic asthma, mild intermittent, uncomplicated 65/68/1275   GERD 03/19/2008   COLONIC POLYPS, HX OF 03/19/2008   NEPHROLITHIASIS, HX OF 03/19/2008   Hypothyroidism 03/18/2008   Hyperlipidemia 03/18/2008   INSOMNIA, CHRONIC 03/18/2008   FIBROMYALGIA 03/18/2008   Prediabetes 03/18/2008    Past Medical History:  Diagnosis Date   Adenomatous colon polyp    Anxiety    PHOBIAS   Arthritis    Breast cancer ([place]) 08/08/13   right LOQ   Cataract    Chronic insomnia    Cluster headaches    history of migraines / NONE FOR SEVERAL YRS   Depression    Diverticulosis    Fatty liver 2011   Fibromyalgia    GERD (gastroesophageal reflux disease)    H/O hiatal hernia    History of transfusion 08/30/2013   Hx of radiation therapy 10/29/13- 12/14/13   right chest wall 5040 cGy 28 sessions, right supraclavicular/axillary region 5040 cGy 28 sessions, right chest wall boost 1000 cGy 5 sessions   Hypothyroidism    Internal hemorrhoids    Irritable bowel syndrome    Kidney stone    Lymphedema    RT ARM - WEARS SLEEVE   Macular degeneration    hole/right eye   MDD (major depressive disorder)    Osteopenia    Other abnormal glucose    Other and unspecified hyperlipidemia    Pain in joint, shoulder region    Pneumonia 1700,1749   Sleep apnea    USES C-PAP   Stress incontinence, female     Family History  Problem Relation Age of Onset   Stroke Mother        died age 23   Diabetes Mother    Breast cancer Mother 54   Breast cancer Sister 65   Breast cancer Paternal Aunt 60   Diabetes Maternal Grandfather    Breast cancer Paternal Grandmother 26   Breast cancer Paternal Aunt        dx in her 25s   Cancer Maternal Grandmother        intra-abdominal cancer   Brain cancer Maternal Uncle 56   Brain cancer Cousin 44       maternal cousin   Brain cancer Cousin 27       paternal cousin   Colon cancer Neg Hx    Past Surgical History:  Procedure Laterality Date   ABDOMINAL HYSTERECTOMY      APPENDECTOMY     BILATERAL TOTAL MASTECTOMY WITH AXILLARY LYMPH NODE DISSECTION  08/30/2013   Dr Barry Dienes   BREAST CYST ASPIRATION     9 cysts   CATARACT EXTRACTION, BILATERAL  2005/2007   CHOLECYSTECTOMY     COLONOSCOPY     CYSTOCELE REPAIR     EVACUATION BREAST HEMATOMA Left 08/31/2013   Procedure: EVACUATION HEMATOMA BREAST;  Surgeon: Stark Klein, MD;  Location: [facility];  Service: General;  Laterality: Left;   EYE SURGERY     to repair macular hole   FOOT ARTHROPLASTY     lt    GANGLION CYST EXCISION     rt foot   HEMORRHOID SURGERY     03/1993   JOINT REPLACEMENT  03/15/11   left knee replacement  KNEE ARTHROSCOPY     /partial knee 2016/left knee 2012   MASS EXCISION  11/04/2011   Procedure: EXCISION MASS;  Surgeon: Cammie Sickle., MD;  Location: [facility];  Service: Orthopedics;  Laterality: Right;  excisional biopsy right ulna mass   MASTECTOMY W/ SENTINEL NODE BIOPSY Right 08/30/2013   Procedure: RIGHT  AXILLARY SENTINEL LYMPH NODE BIOPSY; Right Axillary Node Disection;  Surgeon: Stark Klein, MD;  Location: [facility];  Service: General;  Laterality: Right;  Right side nuc med 7:00    PARTIAL KNEE ARTHROPLASTY Right 11/03/2015   Procedure: RIGHT KNEE MEDIAL UNICOMPARTMENTAL ARTHROPLASTY ;  Surgeon: Gaynelle Arabian, MD;  Location: [facility];  Service: Orthopedics;  Laterality: Right;   RECTOCELE REPAIR     SIMPLE MASTECTOMY WITH AXILLARY SENTINEL NODE BIOPSY Left 08/30/2013   Procedure: Bilateral Breast Mastectomy ;  Surgeon: Stark Klein, MD;  Location: [facility];  Service: General;  Laterality: Left;   skin tags removed     breast, panty line, neckline   TOE SURGERY     preventative crossover toe surg/right foot   TOE SURGERY  2009   left foot/screw  in 2nd toe   TONSILLECTOMY     UPPER GASTROINTESTINAL ENDOSCOPY     Social History   Social History Narrative   Occupation:  Retired Radiation protection practitioner    Married with 3 grown children      Never Smoked      Alcohol use-no        Immunization History  Administered Date(s) Administered   Fluad Quad(high Dose 65+) 07/13/2019, 10/01/2020, 07/31/2021   Influenza Split 07/27/2012   Influenza Whole 09/13/2007, 08/08/2008, 07/30/2009, 08/25/2010   Influenza, High Dose Seasonal PF 08/03/2014, 08/08/2015, 08/23/2016, 07/14/2017, 07/20/2018   Influenza,inj,Quad PF,6+ Mos 07/25/2013   PFIZER Comirnaty(Gray Top)Covid-19 Tri-Sucrose Vaccine 02/18/2021, 09/07/2021   PFIZER(Purple Top)SARS-COV-2 Vaccination 12/23/2019, 01/17/2020, 08/18/2020   Pneumococcal Conjugate-13 08/16/2013   Pneumococcal Polysaccharide-23 06/18/2008   Tdap 11/29/2012   Zoster Recombinat (Shingrix) 04/11/2018, 09/22/2018   Zoster, Live 08/05/2011     Objective: Vital Signs: BP 128/87 (BP Location: Left Arm, Patient Position: Sitting, Cuff Size: Normal)   Pulse 65   Resp 16   Ht '4\' 11"'$  (1.499 m)   Wt 170 lb 9.6 oz (77.4 kg)   BMI 34.46 kg/m    Physical Exam Vitals and nursing note reviewed.  Constitutional:      Appearance: She is well-developed.  HENT:     Head: Normocephalic and atraumatic.  Eyes:     Conjunctiva/sclera: Conjunctivae normal.  Cardiovascular:     Rate and Rhythm: Normal rate and regular rhythm.     Heart sounds: Normal heart sounds.  Pulmonary:     Effort: Pulmonary effort is normal.     Breath sounds: Normal breath sounds.  Abdominal:     General: Bowel sounds are normal.     Palpations: Abdomen is soft.  Musculoskeletal:     Cervical back: Normal range of motion.  Skin:    General: Skin is warm and dry.     Capillary Refill: Capillary refill takes less than 2 seconds.  Neurological:     Mental Status: She is alert and oriented to person, place, and time.  Psychiatric:        Behavior: Behavior normal.      Musculoskeletal Exam: C-spine, thoracic spine, and lumbar spine good ROM.  No midline spinal tenderness or SI joint tenderness.  Shoulder joints, elbow joints, wrist joints, MCPs, PIPs,  and DIPs good  ROM with no synovitis.  Complete fist formation bilaterally.   CMC joint prominence and thickening noted bilaterally.  Subluxation of both CMC Joints. Hip joints have good ROM with no groin pain. Partial right knee replacement has good ROM.  Left knee replacement has good ROM.  Ankle joints have good ROM with no tenderness or joint swelling.   CDAI Exam: CDAI Score: -- Patient Global: --; Provider Global: -- Swollen: --; Tender: -- Joint Exam 07/26/2022   No joint exam has been documented for this visit   There is currently no information documented on the homunculus. Go to the Rheumatology activity and complete the homunculus joint exam.  Investigation: No additional findings.  Imaging: No results found.  Recent Labs: Lab Results  Component Value Date   WBC 7.7 06/15/2022   HGB 15.5 (H) 06/15/2022   PLT 180.0 06/15/2022   NA 140 06/15/2022   K 4.0 06/15/2022   CL 103 06/15/2022   CO2 27 06/15/2022   GLUCOSE 119 (H) 06/15/2022   BUN 14 06/15/2022   CREATININE 0.78 06/15/2022   BILITOT 0.6 05/25/2022   ALKPHOS 58 05/25/2022   AST 17 05/25/2022   ALT 20 05/25/2022   PROT 7.0 05/25/2022   ALBUMIN 4.7 05/25/2022   CALCIUM 10.1 06/15/2022   GFRAA 77 09/12/2020    Speciality Comments: No specialty comments available.  Procedures:  No procedures performed Allergies: Patient has no known allergies.   Assessment / Plan:     Visit Diagnoses: Fibromyalgia: She experiences intermittent myalgias and muscle tenderness due to fibromyalgia.  Overall her symptoms have been tolerable on the current treatment regimen.  She remains on Lyrica 150 mg twice daily, Effexor, and Topamax as prescribed.  Discussed the importance of regular exercise and good sleep hygiene. She has been exercising in the pool for 1 to 2 hours daily which has been helpful.   She will follow up in the office in 6 months or sooner if needed.  Primary osteoarthritis of both hands - She has PIP and DIP  thickening consistent with osteoarthritis of both hands.  CMC joint thickening and subluxation bilaterally.  No signs of inflammatory arthritis.  No tenderness or synovitis of MCP joints.  She has difficulty knitting due to the pain and stiffness in her hands. Bilateral CMC joint injections on 07/02/20.  Patient was advised to notify us if she would like to return for an ultrasound guided [facility] joint injection in the future.  Discussed the importance of joint protection and muscle strengthening.  She plans on using voltaren gel topically as needed for pain relief.   Status post total left knee replacement: Chronic pain. No warmth or effusion noted on examination today. She is been exercising in the pool 1 to 2 hours daily.  She had increased discomfort in the left knee while walking in the pool this past weekend.  She plans on following up with Dr. Wynelle Link for further evaluation.  Status post right partial knee replacement: Updated X-rays on 05/25/22 ordered by PCP and reviewed today.  Patient plans on following up with her orthopedist for further evaluation due to increased discomfort in both knee replacements.  No warmth or effusion noted on examination today.   DDD (degenerative disc disease), cervical - Multilevel spondylosis and facet joint arthropathy noted on the MRI December 23, 2021. She experiences crepitus intermittently.  She has good ROM on examination today.  No symptoms of radiculopathy.   DDD (degenerative disc disease), lumbar - Multilevel spondylosis, facet joint arthropathy and  moderate to spinal stenosis noted on the MRI obtained on December 23, 2021. She is not experiencing any increased discomfort in her lower back at this time. Completed PT after her last office visit. She has been exercising in the pool 1-2 hours daily.    Other medical conditions are listed as follows:   Pulmonary fibrosis (HCC)  Allergic asthma, mild intermittent, uncomplicated  History of  hyperlipidemia  Prediabetes  Malignant neoplasm of lower-outer quadrant of right breast of female, estrogen receptor positive ([place])  History of IBS  History of gastroesophageal reflux (GERD)  MDD (major depressive disorder), recurrent episode, mild (HCC)  History of colonic polyps  History of hypothyroidism  History of nephrolithiasis  History of recurrent UTIs  Orders: No orders of the defined types were placed in this encounter.  No orders of the defined types were placed in this encounter.     Follow-Up Instructions: Return in about 6 months (around 01/24/2023) for Fibromyalgia, Osteoarthritis, DDD.   Ofilia Neas, PA-C  Note - This record has been created using Dragon software.  Chart creation errors have been sought, but may not always  have been located. Such creation errors do not reflect on  the standard of medical care.

## 2022-07-14 ENCOUNTER — Ambulatory Visit: Payer: Medicare HMO | Admitting: Adult Health

## 2022-07-15 ENCOUNTER — Ambulatory Visit (INDEPENDENT_AMBULATORY_CARE_PROVIDER_SITE_OTHER): Payer: Medicare HMO | Admitting: Adult Health

## 2022-07-15 ENCOUNTER — Encounter: Payer: Self-pay | Admitting: Adult Health

## 2022-07-15 VITALS — HR 83 | Temp 98.2°F | Ht 59.0 in | Wt 162.0 lb

## 2022-07-15 DIAGNOSIS — K529 Noninfective gastroenteritis and colitis, unspecified: Secondary | ICD-10-CM | POA: Diagnosis not present

## 2022-07-15 DIAGNOSIS — N3281 Overactive bladder: Secondary | ICD-10-CM

## 2022-07-15 LAB — POCT URINALYSIS DIPSTICK
Bilirubin, UA: POSITIVE
Blood, UA: POSITIVE
Glucose, UA: NEGATIVE
Ketones, UA: POSITIVE
Leukocytes, UA: NEGATIVE
Nitrite, UA: NEGATIVE
Protein, UA: POSITIVE — AB
Spec Grav, UA: >=1.03 — AB
Urobilinogen, UA: 0.2 U/dL
pH, UA: 6

## 2022-07-15 MED ORDER — DICYCLOMINE HCL 10 MG PO CAPS: 10.0000 mg | ORAL_CAPSULE | Freq: Three times a day (TID) | ORAL | 0 refills | Status: DC

## 2022-07-15 NOTE — Progress Notes (Signed)
Subjective:    [name], female    DOB: 1941-05-08, 81 y.o.   MRN: [id number]  HPI 81 year old female who  has a past medical history of Adenomatous colon polyp, Anxiety, Arthritis, Breast cancer ([place]) (08/08/13), Cataract, Chronic insomnia, Cluster headaches, Depression, Diverticulosis, Fatty liver (2011), Fibromyalgia, GERD (gastroesophageal reflux disease), H/O hiatal hernia, History of transfusion (08/30/2013), radiation therapy (10/29/13- 12/14/13), Hypothyroidism, Internal hemorrhoids, Irritable bowel syndrome, Kidney stone, Lymphedema, Macular degeneration, MDD (major depressive disorder), Osteopenia, Other abnormal glucose, Other and unspecified hyperlipidemia, Pain in joint, shoulder region, Pneumonia (1540,0867), Sleep apnea, and Stress incontinence, female.  She presents to the office today for an acute issue of GI issues. She reports that over the last 3-4 days she has been experiencing loose stools and abdominal pain with loss of appetite.  She has had some nausea but no vomiting.   Her husband had the same symptoms that started on the same day - he just started feeling better.   She also continues to have OAB symptoms. She was placed on Trospium 60 mg ER daily but this made her feel worse so it was stopped. She has been on Myrbetriq 25 mg in the past but did not notice any improvement. She is taking Macrobid for lactic lead to prevent UTIs.  Denies dysuria, hematuria   Review of Systems See HPI   Past Medical History:  Diagnosis Date   Adenomatous colon polyp    Anxiety    PHOBIAS   Arthritis    Breast cancer ([place]) 08/08/13   right LOQ   Cataract    Chronic insomnia    Cluster headaches    history of migraines / NONE FOR SEVERAL YRS   Depression    Diverticulosis    Fatty liver 2011   Fibromyalgia    GERD (gastroesophageal reflux disease)    H/O hiatal hernia    History of transfusion 08/30/2013   Hx of radiation therapy 10/29/13- 12/14/13   right  chest wall 5040 cGy 28 sessions, right supraclavicular/axillary region 5040 cGy 28 sessions, right chest wall boost 1000 cGy 5 sessions   Hypothyroidism    Internal hemorrhoids    Irritable bowel syndrome    Kidney stone    Lymphedema    RT ARM - WEARS SLEEVE   Macular degeneration    hole/right eye   MDD (major depressive disorder)    Osteopenia    Other abnormal glucose    Other and unspecified hyperlipidemia    Pain in joint, shoulder region    Pneumonia 6195,0932   Sleep apnea    USES C-PAP   Stress incontinence, female     Social History   Socioeconomic History   Marital status: Married    Spouse name: Fritz Pickerel   Number of children: 3   Years of education: Not on file   Highest education level: Not on file  Occupational History   Occupation: retired bookkeeper  Tobacco Use   Smoking status: Former    Packs/day: 0.10    Years: 2.00    Total pack years: 0.20    Types: Cigarettes    Start date: 11/16/1959    Quit date: 11/15/1960    Years since quitting: 61.7   Smokeless tobacco: Never  Vaping Use   Vaping Use: Never used  Substance and Sexual Activity   Alcohol use: Yes    Comment: occ   Drug use: No   Sexual activity: Yes    Comment: menarche age 46, fist  live birth 18, P 3, hysterectomy age 89, no HRT, BCP 2 yrs  Other Topics Concern   Not on file  Social History Narrative   Occupation:  Retired Radiation protection practitioner    Married with 3 grown children      Never Smoked     Alcohol use-no        Social Determinants of Health   Financial Resource Strain: Low Risk  (09/15/2021)   Overall Financial Resource Strain (CARDIA)    Difficulty of Paying Living Expenses: Not hard at all  Food Insecurity: No Food Insecurity (09/15/2021)   Hunger Vital Sign    Worried About Running Out of Food in the Last Year: Never true    Ran Out of Food in the Last Year: Never true  Transportation Needs: No Transportation Needs (09/15/2021)   PRAPARE - Hydrologist  (Medical): No    Lack of Transportation (Non-Medical): No  Physical Activity: Inactive (09/15/2021)   Exercise Vital Sign    Days of Exercise per Week: 0 days    Minutes of Exercise per Session: 30 min  Stress: Stress Concern Present (09/15/2021)   [place]    Feeling of Stress : To some extent  Social Connections: Socially Integrated (09/15/2021)   Social Connection and Isolation Panel [NHANES]    Frequency of Communication with Friends and Family: More than three times a week    Frequency of Social Gatherings with Friends and Family: Twice a week    Attends Religious Services: More than 4 times per year    Active Member of Clubs or Organizations: Yes    Attends Archivist Meetings: More than 4 times per year    Marital Status: Married  Human resources officer Violence: Not At Risk (07/22/2021)   Humiliation, Afraid, Rape, and Kick questionnaire    Fear of Current or Ex-Partner: No    Emotionally Abused: No    Physically Abused: No    Sexually Abused: No    Past Surgical History:  Procedure Laterality Date   ABDOMINAL HYSTERECTOMY     APPENDECTOMY     BILATERAL TOTAL MASTECTOMY WITH AXILLARY LYMPH NODE DISSECTION  08/30/2013   Dr Barry Dienes   BREAST CYST ASPIRATION     9 cysts   CATARACT EXTRACTION, BILATERAL  2005/2007   CHOLECYSTECTOMY     COLONOSCOPY     CYSTOCELE REPAIR     EVACUATION BREAST HEMATOMA Left 08/31/2013   Procedure: EVACUATION HEMATOMA BREAST;  Surgeon: Stark Klein, MD;  Location: [facility];  Service: General;  Laterality: Left;   EYE SURGERY     to repair macular hole   FOOT ARTHROPLASTY     lt    GANGLION CYST EXCISION     rt foot   HEMORRHOID SURGERY     03/1993   JOINT REPLACEMENT  03/15/11   left knee replacement   KNEE ARTHROSCOPY     /partial knee 2016/left knee 2012   MASS EXCISION  11/04/2011   Procedure: EXCISION MASS;  Surgeon: Cammie Sickle., MD;  Location: [facility];  Service: Orthopedics;  Laterality: Right;  excisional biopsy right ulna mass   MASTECTOMY W/ SENTINEL NODE BIOPSY Right 08/30/2013   Procedure: RIGHT  AXILLARY SENTINEL LYMPH NODE BIOPSY; Right Axillary Node Disection;  Surgeon: Stark Klein, MD;  Location: [facility];  Service: General;  Laterality: Right;  Right side nuc med 7:00    PARTIAL KNEE ARTHROPLASTY Right  11/03/2015   Procedure: RIGHT KNEE MEDIAL UNICOMPARTMENTAL ARTHROPLASTY ;  Surgeon: Gaynelle Arabian, MD;  Location: [facility];  Service: Orthopedics;  Laterality: Right;   RECTOCELE REPAIR     SIMPLE MASTECTOMY WITH AXILLARY SENTINEL NODE BIOPSY Left 08/30/2013   Procedure: Bilateral Breast Mastectomy ;  Surgeon: Stark Klein, MD;  Location: [facility];  Service: General;  Laterality: Left;   skin tags removed     breast, panty line, neckline   TOE SURGERY     preventative crossover toe surg/right foot   TOE SURGERY  2009   left foot/screw  in 2nd toe   TONSILLECTOMY     UPPER GASTROINTESTINAL ENDOSCOPY      Family History  Problem Relation Age of Onset   Stroke Mother        died age 45   Diabetes Mother    Breast cancer Mother 66   Breast cancer Sister 39   Breast cancer Paternal Aunt 42   Diabetes Maternal Grandfather    Breast cancer Paternal Grandmother 50   Breast cancer Paternal Aunt        dx in her 54s   Cancer Maternal Grandmother        intra-abdominal cancer   Brain cancer Maternal Uncle 8   Brain cancer Cousin 30       maternal cousin   Brain cancer Cousin 83       paternal cousin   Colon cancer Neg Hx     No Known Allergies  Current Outpatient Medications on File Prior to Visit  Medication Sig Dispense Refill   Bacillus Coagulans-Inulin (PROBIOTIC) 1-250 BILLION-MG CAPS Take by mouth.     butalbital-acetaminophen-caffeine (FIORICET) 50-325-40 MG tablet Take 1 tablet by mouth as needed for headache. 30 tablet 2   Calcium Carbonate-Vitamin D (CALCIUM-VITAMIN D3 PO) Take 1 tablet by mouth in the  morning and at bedtime.     Cholecalciferol (VITAMIN D3) 50 MCG (2000 UT) TABS Take 2 tablets by mouth daily.     diazepam (VALIUM) 5 MG tablet Take 5 mg by mouth every 6 (six) hours as needed for anxiety. As needed (flying & dental)     esomeprazole (NEXIUM) 40 MG capsule Take 1 capsule (40 mg total) by mouth daily at 12 noon. (Patient taking differently: Take 40 mg by mouth daily before breakfast.) 365 capsule 0   estradiol (ESTRACE) 0.1 MG/GM vaginal cream Place 0.5 g vaginally 2 (two) times a week. Place 0.5g nightly for two weeks then twice a week after 30 g 11   famotidine (PEPCID) 40 MG tablet TAKE ONE TABLET BY MOUTH EVERY NIGHT AT BEDTIME 90 tablet 1   levothyroxine (SYNTHROID) 100 MCG tablet TAKE ONE TABLET BY MOUTH DAILY 365 tablet 0   MELATONIN PO Take 1 tablet by mouth daily.     montelukast (SINGULAIR) 10 MG tablet Take 1 tablet (10 mg total) by mouth at bedtime. 90 tablet 1   nitrofurantoin, macrocrystal-monohydrate, (MACROBID) 100 MG capsule Take 1 capsule (100 mg total) by mouth at bedtime. 90 capsule 3   omega-3 acid ethyl esters (LOVAZA) 1 g capsule TAKE ONE CAPSULE BY MOUTH TWICE A DAY 180 capsule 3   Polyethylene Glycol 3350 (MIRALAX PO) Take by mouth. 1/2 capful every morning     pregabalin (LYRICA) 150 MG capsule Take 1 capsule (150 mg total) by mouth 2 (two) times daily. 180 capsule 0   rosuvastatin (CRESTOR) 20 MG tablet Take 1 tablet (20 mg total) by mouth daily. 90 tablet 2  topiramate (TOPAMAX) 25 MG tablet TAKE ONE TABLET BY MOUTH DAILY 325 tablet 0   venlafaxine XR (EFFEXOR-XR) 37.5 MG 24 hr capsule TAKE ONE CAPSULE BY MOUTH DAILY WITH BREAKFAST 90 capsule 1   No current facility-administered medications on file prior to visit.    Pulse 83   Temp 98.2 F (36.8 C) (Oral)   Ht '4\' 11"'$  (1.499 m)   Wt 162 lb (73.5 kg)   SpO2 94%   BMI 32.72 kg/m       Objective:   Physical Exam Vitals and nursing note reviewed.  Constitutional:      Appearance: Normal  appearance. She is ill-appearing.  Cardiovascular:     Rate and Rhythm: Normal rate and regular rhythm.     Pulses: Normal pulses.     Heart sounds: Normal heart sounds.  Pulmonary:     Effort: Pulmonary effort is normal.     Breath sounds: Normal breath sounds.  Abdominal:     General: Abdomen is flat. Bowel sounds are normal.     Palpations: Abdomen is soft.     Tenderness: There is abdominal tenderness (generalized).  Musculoskeletal:        General: Normal range of motion.  Skin:    General: Skin is warm and dry.     Capillary Refill: Capillary refill takes less than 2 seconds.  Neurological:     General: No focal deficit present.     Mental Status: She is alert and oriented to person, place, and time.  Psychiatric:        Mood and Affect: Mood normal.        Behavior: Behavior normal.        Thought Content: Thought content normal.        Judgment: Judgment normal.       Assessment & Plan:  1. Gastroenteritis - Encouraged fluids, bland diet and rest. Will prescribe bentyl for abdominal cramping  - dicyclomine (BENTYL) 10 MG capsule; Take 1 capsule (10 mg total) by mouth 4 (four) times daily -  before meals and at bedtime for 5 days.  Dispense: 20 capsule; Refill: 0  2. OAB (overactive bladder) - 2 weeks of Myrbetriq 50 mg samples given to patient to try - POC Urinalysis Dipstick + protein and ketones d/t dehydration.  Good of leukocytes and nitrates - Increase fluids    [name], NP  Time spent with patient today was 30 minutes which consisted of chart review, discussing OAB, and gastroenteritis work up, treatment answering questions and documentation.

## 2022-07-16 ENCOUNTER — Encounter: Payer: Self-pay | Admitting: Adult Health

## 2022-07-16 ENCOUNTER — Telehealth: Payer: Self-pay | Admitting: Adult Health

## 2022-07-16 NOTE — Telephone Encounter (Signed)
Spoke with spouse Optician, dispensing) to schedule Medicare Annual Wellness Visit (AWV) either virtually or in office.   He stated pt was in bed sleeping and has been sick  he wanted a call back in a couple weeks to schedule   Last AWV 07/22/21 please schedule at anytime with [facility] Nurse Health Advisor 1 or 2

## 2022-07-20 NOTE — Telephone Encounter (Signed)
FYI

## 2022-07-23 ENCOUNTER — Encounter: Payer: Self-pay | Admitting: Adult Health

## 2022-07-23 MED ORDER — MIRABEGRON ER 50 MG PO TB24: 50.0000 mg | ORAL_TABLET | Freq: Every day | ORAL | 2 refills | Status: DC

## 2022-07-23 NOTE — Telephone Encounter (Signed)
Please advise I do not see on pt med list

## 2022-07-26 ENCOUNTER — Encounter: Payer: Self-pay | Admitting: Adult Health

## 2022-07-26 ENCOUNTER — Ambulatory Visit: Payer: Medicare HMO | Attending: Physician Assistant | Admitting: Physician Assistant

## 2022-07-26 ENCOUNTER — Encounter: Payer: Self-pay | Admitting: Physician Assistant

## 2022-07-26 VITALS — BP 128/87 | HR 65 | Resp 16 | Ht 59.0 in | Wt 170.6 lb

## 2022-07-26 DIAGNOSIS — M5136 Other intervertebral disc degeneration, lumbar region: Secondary | ICD-10-CM

## 2022-07-26 DIAGNOSIS — M5030 Other cervical disc degeneration, unspecified cervical region: Secondary | ICD-10-CM

## 2022-07-26 DIAGNOSIS — F330 Major depressive disorder, recurrent, mild: Secondary | ICD-10-CM

## 2022-07-26 DIAGNOSIS — C50511 Malignant neoplasm of lower-outer quadrant of right female breast: Secondary | ICD-10-CM

## 2022-07-26 DIAGNOSIS — J4520 Mild intermittent asthma, uncomplicated: Secondary | ICD-10-CM

## 2022-07-26 DIAGNOSIS — Z8719 Personal history of other diseases of the digestive system: Secondary | ICD-10-CM

## 2022-07-26 DIAGNOSIS — Z87442 Personal history of urinary calculi: Secondary | ICD-10-CM

## 2022-07-26 DIAGNOSIS — Z8639 Personal history of other endocrine, nutritional and metabolic disease: Secondary | ICD-10-CM

## 2022-07-26 DIAGNOSIS — Z96651 Presence of right artificial knee joint: Secondary | ICD-10-CM

## 2022-07-26 DIAGNOSIS — M797 Fibromyalgia: Secondary | ICD-10-CM

## 2022-07-26 DIAGNOSIS — Z96652 Presence of left artificial knee joint: Secondary | ICD-10-CM

## 2022-07-26 DIAGNOSIS — Z86010 Personal history of colonic polyps: Secondary | ICD-10-CM

## 2022-07-26 DIAGNOSIS — Z170 Estrogen receptor positive status [ER+]: Secondary | ICD-10-CM

## 2022-07-26 DIAGNOSIS — M19042 Primary osteoarthritis, left hand: Secondary | ICD-10-CM

## 2022-07-26 DIAGNOSIS — J8410 Pulmonary fibrosis, unspecified: Secondary | ICD-10-CM

## 2022-07-26 DIAGNOSIS — Z87440 Personal history of urinary (tract) infections: Secondary | ICD-10-CM

## 2022-07-26 DIAGNOSIS — R7303 Prediabetes: Secondary | ICD-10-CM

## 2022-07-26 DIAGNOSIS — M19041 Primary osteoarthritis, right hand: Secondary | ICD-10-CM | POA: Diagnosis not present

## 2022-07-27 ENCOUNTER — Telehealth: Payer: Self-pay | Admitting: Adult Health

## 2022-07-27 NOTE — Telephone Encounter (Signed)
Left message for patient to call back and schedule Medicare Annual Wellness Visit (AWV) either virtually or in office. Left  my Herbie Drape number [phone]   Last AWV ;07/22/21  please schedule at anytime with [facility] Nurse Health Advisor 1 or 2

## 2022-07-27 NOTE — Telephone Encounter (Signed)
Spouse returned my call.  He stated he had his awv a few weeks ago and thought it was a waste of time.   He said she would ask patient and have her call me back

## 2022-07-28 ENCOUNTER — Encounter: Payer: Self-pay | Admitting: Adult Health

## 2022-07-28 ENCOUNTER — Other Ambulatory Visit: Payer: Self-pay | Admitting: Adult Health

## 2022-07-28 MED ORDER — MIRABEGRON ER 50 MG PO TB24
50.0000 mg | ORAL_TABLET | Freq: Every day | ORAL | 3 refills | Status: DC
Start: 2022-07-28 — End: 2022-12-07

## 2022-07-28 NOTE — Telephone Encounter (Signed)
Ok to write new Rx and print for pt?

## 2022-07-30 ENCOUNTER — Encounter: Payer: Self-pay | Admitting: Adult Health

## 2022-07-30 NOTE — Telephone Encounter (Signed)
Please advise 

## 2022-08-08 ENCOUNTER — Emergency Department (HOSPITAL_BASED_OUTPATIENT_CLINIC_OR_DEPARTMENT_OTHER)
Admission: EM | Admit: 2022-08-08 | Discharge: 2022-08-08 | Disposition: A | Discharge status: Home / Self Care | Payer: Medicare HMO | Attending: Emergency Medicine | Admitting: Emergency Medicine

## 2022-08-08 ENCOUNTER — Encounter (HOSPITAL_BASED_OUTPATIENT_CLINIC_OR_DEPARTMENT_OTHER): Payer: Self-pay

## 2022-08-08 DIAGNOSIS — Z853 Personal history of malignant neoplasm of breast: Secondary | ICD-10-CM | POA: Diagnosis not present

## 2022-08-08 DIAGNOSIS — L03031 Cellulitis of right toe: Secondary | ICD-10-CM

## 2022-08-08 DIAGNOSIS — M79674 Pain in right toe(s): Secondary | ICD-10-CM | POA: Diagnosis present

## 2022-08-08 DIAGNOSIS — E039 Hypothyroidism, unspecified: Secondary | ICD-10-CM | POA: Diagnosis not present

## 2022-08-08 MED ORDER — CEPHALEXIN 500 MG PO CAPS: 500.0000 mg | ORAL_CAPSULE | Freq: Four times a day (QID) | ORAL | 0 refills | Status: AC

## 2022-08-08 NOTE — Discharge Instructions (Signed)
Today you were seen in the emergency department for your toe infection.    In the emergency department you were prescribed antibiotics.    At home, please take the antibiotics prescribed.    Follow-up with your primary doctor in 2-3 days regarding your visit.    Return immediately to the emergency department if you experience any of the following: Worsening redness, pain, fevers, or any other concerning symptoms.    Thank you for visiting our Emergency Department. It was a pleasure taking care of you today.

## 2022-08-08 NOTE — ED Provider Notes (Signed)
[facility] EMERGENCY DEPT Provider Note   CSN: [id number] Arrival date & time: 08/08/22  6546     History  Chief Complaint  Patient presents with   Toe Pain    Jean Nash is a 81 y.o. female.  81 year old female who presents emergency department with right great toe redness.  Patient states that yesterday her husband noticed that her right great toe was red.  Denies any discharge from the area.  Denies any fevers.  Says that she has aches and pains at baseline so has not noticed any pain that was unusual.  Says that she is still able to flex her toe.  No history of gout, diabetes, or recent immunosuppressive medication use.  Says she is on Macrobid for UTI prophylaxis.  No recent infections.  Thinks that she may have scraped her toe on a pool several weeks ago.    Past Medical History:  Diagnosis Date   Adenomatous colon polyp    Anxiety    PHOBIAS   Arthritis    Breast cancer ([place]) 08/08/13   right LOQ   Cataract    Chronic insomnia    Cluster headaches    history of migraines / NONE FOR SEVERAL YRS   Depression    Diverticulosis    Fatty liver 2011   Fibromyalgia    GERD (gastroesophageal reflux disease)    H/O hiatal hernia    History of transfusion 08/30/2013   Hx of radiation therapy 10/29/13- 12/14/13   right chest wall 5040 cGy 28 sessions, right supraclavicular/axillary region 5040 cGy 28 sessions, right chest wall boost 1000 cGy 5 sessions   Hypothyroidism    Internal hemorrhoids    Irritable bowel syndrome    Kidney stone    Lymphedema    RT ARM - WEARS SLEEVE   Macular degeneration    hole/right eye   MDD (major depressive disorder)    Osteopenia    Other abnormal glucose    Other and unspecified hyperlipidemia    Pain in joint, shoulder region    Pneumonia 5035,4656   Sleep apnea    USES C-PAP   Stress incontinence, female        Home Medications Prior to Admission medications   Medication Sig Start Date End Date Taking?  Authorizing Provider  cephALEXin (KEFLEX) 500 MG capsule Take 1 capsule (500 mg total) by mouth 4 (four) times daily for 6 days. 08/08/22 08/14/22 Yes Fransico Meadow, MD  Bacillus Coagulans-Inulin (PROBIOTIC) 1-250 BILLION-MG CAPS Take by mouth.    [provider]  butalbital-acetaminophen-caffeine (FIORICET) 6463251501 MG tablet Take 1 tablet by mouth as needed for headache. 01/27/22   [name], NP  Calcium Carbonate-Vitamin D (CALCIUM-VITAMIN D3 PO) Take 1 tablet by mouth in the morning and at bedtime.    [provider]  Cholecalciferol (VITAMIN D3) 50 MCG (2000 UT) TABS Take 2 tablets by mouth daily.    [provider]  diazepam (VALIUM) 5 MG tablet Take 5 mg by mouth every 6 (six) hours as needed for anxiety. As needed (flying & dental)    [provider]  dicyclomine (BENTYL) 10 MG capsule Take 1 capsule (10 mg total) by mouth 4 (four) times daily -  before meals and at bedtime for 5 days. Patient not taking: Reported on 07/26/2022 07/15/22 07/20/22  [name], NP  esomeprazole (NEXIUM) 40 MG capsule Take 1 capsule (40 mg total) by mouth daily at 12 noon. Patient taking differently: Take 40 mg by mouth daily  before breakfast. 02/09/22   Nafziger, Tommi Rumps, NP  estradiol (ESTRACE) 0.1 MG/GM vaginal cream Place 0.5 g vaginally 2 (two) times a week. Place 0.5g nightly for two weeks then twice a week after Patient not taking: Reported on 07/26/2022 06/03/22   Jaquita Folds, MD  famotidine (PEPCID) 40 MG tablet TAKE ONE TABLET BY MOUTH EVERY NIGHT AT BEDTIME 04/22/22   Esterwood, Amy S, PA-C  levothyroxine (SYNTHROID) 100 MCG tablet TAKE ONE TABLET BY MOUTH DAILY 05/19/22   Nafziger, Tommi Rumps, NP  MELATONIN PO Take 1 tablet by mouth daily.    [provider]  mirabegron ER (MYRBETRIQ) 50 MG TB24 tablet Take 1 tablet (50 mg total) by mouth daily. 07/28/22   Nafziger, Tommi Rumps, NP  montelukast (SINGULAIR) 10 MG tablet Take 1 tablet (10 mg total) by mouth at  bedtime. 05/04/22   Nafziger, Tommi Rumps, NP  nitrofurantoin, macrocrystal-monohydrate, (MACROBID) 100 MG capsule Take 1 capsule (100 mg total) by mouth at bedtime. 07/02/22   Nafziger, Tommi Rumps, NP  omega-3 acid ethyl esters (LOVAZA) 1 g capsule TAKE ONE CAPSULE BY MOUTH TWICE A DAY 09/03/21   Nafziger, Tommi Rumps, NP  Polyethylene Glycol 3350 (MIRALAX PO) Take by mouth. 1/2 capful every morning    [provider]  pregabalin (LYRICA) 150 MG capsule Take 1 capsule (150 mg total) by mouth 2 (two) times daily. 05/25/22 08/23/22  Nafziger, Tommi Rumps, NP  rosuvastatin (CRESTOR) 20 MG tablet Take 1 tablet (20 mg total) by mouth daily. 05/27/22   Nafziger, Tommi Rumps, NP  topiramate (TOPAMAX) 25 MG tablet TAKE ONE TABLET BY MOUTH DAILY 01/05/22   Nafziger, Tommi Rumps, NP  venlafaxine XR (EFFEXOR-XR) 37.5 MG 24 hr capsule TAKE ONE CAPSULE BY MOUTH DAILY WITH BREAKFAST 05/25/22   [name], NP      Allergies    Patient has no known allergies.    Review of Systems   Review of Systems  Physical Exam Updated Vital Signs BP 129/78 (BP Location: Left Arm)   Pulse 70   Temp 98.4 F (36.9 C) (Oral)   Resp 16   SpO2 100%  Physical Exam Vitals and nursing note reviewed.  Constitutional:      General: She is not in acute distress.    Appearance: She is well-developed.  HENT:     Head: Normocephalic and atraumatic.     Right Ear: External ear normal.     Left Ear: External ear normal.     Nose: Nose normal.  Eyes:     Extraocular Movements: Extraocular movements intact.     Conjunctiva/sclera: Conjunctivae normal.     Pupils: Pupils are equal, round, and reactive to light.  Pulmonary:     Effort: Pulmonary effort is normal. No respiratory distress.  Abdominal:     General: Abdomen is flat.  Musculoskeletal:        General: No swelling.     Cervical back: Normal range of motion and neck supple.     Right lower leg: No edema.     Left lower leg: No edema.  Skin:    General: Skin is warm and dry.     Capillary  Refill: Capillary refill takes less than 2 seconds.     Comments: Rash on right great toe.  No purulent material noted.  No crepitance noted.  No tenderness to palpation of right great toe MTP.  Able to fully flex right great toe without significant pain.  Neurological:     Mental Status: She is alert and oriented to person, place,  and time. Mental status is at baseline.  Psychiatric:        Mood and Affect: Mood normal.      ED Results / Procedures / Treatments   Labs (all labs ordered are listed, but only abnormal results are displayed) Labs Reviewed - No data to display  EKG None  Radiology No results found.  Procedures Procedures   Medications Ordered in ED Medications - No data to display  ED Course/ Medical Decision Making/ A&P                           Medical Decision Making Risk Prescription drug management.   Trinidi Toppins is a 81 y.o. female who presents with chief complaint of right great toe pain.  Initial Ddx:  Paronychia, cellulitis, gout  MDM:  Feel the patient likely has a paronychia or cellulitis given the location of her symptoms.  No fluctuance palpated and do not feel that elevating the above skin from the nail bed would be beneficial to the patient at this time.  Does not have joint pain or history of gout that would be concerning for gout at this time.  Plan:  Keflex  ED Summary:  Patient given outpatient prescription for Keflex instructions to follow-up with her primary doctor in several days.  Return precautions discussed with the patient and her husband prior to discharge.  Dispo: DC Home. Return precautions discussed including, but not limited to, those listed in the AVS. Allowed pt time to ask questions which were answered fully prior to dc.   Additional history obtained from spouse Records reviewed Care Everywhere  Final Clinical Impression(s) / ED Diagnoses Final diagnoses:  Cellulitis of great toe of right foot    Rx / DC  Orders ED Discharge Orders          Ordered    cephALEXin (KEFLEX) 500 MG capsule  4 times daily        08/08/22 1201              Fransico Meadow, MD 08/08/22 1206

## 2022-08-08 NOTE — ED Triage Notes (Signed)
She reports a feeling that her right great toe "hurt when I put my shoe on". She and her husband noted the right great toe to be erythematous last night. She is ambulatory and in no distress. Her right great toe is erythematous, however, the erythema does not extend to the m.t.p. joint and is minimally tender.

## 2022-08-08 NOTE — ED Notes (Signed)
Pt left before dc instructions given.

## 2022-08-11 ENCOUNTER — Encounter (HOSPITAL_COMMUNITY): Payer: Self-pay | Admitting: Psychiatry

## 2022-08-11 ENCOUNTER — Ambulatory Visit (HOSPITAL_BASED_OUTPATIENT_CLINIC_OR_DEPARTMENT_OTHER): Payer: Medicare HMO | Admitting: Psychiatry

## 2022-08-11 DIAGNOSIS — M797 Fibromyalgia: Secondary | ICD-10-CM

## 2022-08-11 DIAGNOSIS — F3342 Major depressive disorder, recurrent, in full remission: Secondary | ICD-10-CM

## 2022-08-11 DIAGNOSIS — F330 Major depressive disorder, recurrent, mild: Secondary | ICD-10-CM

## 2022-08-11 DIAGNOSIS — L409 Psoriasis, unspecified: Secondary | ICD-10-CM

## 2022-08-11 MED ORDER — DULOXETINE HCL 30 MG PO CPEP: 30.0000 mg | ORAL_CAPSULE | Freq: Every day | ORAL | 1 refills | Status: DC

## 2022-08-11 NOTE — Progress Notes (Unsigned)
Psychiatric Initial Adult Assessment   Patient Identification: Jean Nash MRN:  [id number] Date of Evaluation:  08/11/2022 Referral Source: PCP Chief Complaint:   Chief Complaint  Patient presents with   Establish Care   Visit Diagnosis:    ICD-10-CM   1. MDD (major depressive disorder), recurrent episode, mild ([place])  F33.0     2. Psoriasis  L40.9        Assessment:  Jean Nash is a 81 y.o. y.o. female with a history of MDD, anxiety, and fibromyalgia who presents in person to [facility] for initial evaluation on 08/11/22.    Patient for initial evaluation reporting a history of MDD but denying any current symptoms other than some increased fatigue.  She notes that the increased fatigue seemed to correlate with when the pregabalin was increased for her fibromyalgia.  Patient denies any other psychiatric symptoms including psychosis, paranoia, delusions, SI/HI, thoughts of self-harm, or neurovegetative signs of depression.  Patient reports struggling with chronic pain symptoms as well as migraines with some benefit from the pregabalin.  She denies any benefit from the Topamax.  Treatment options were discussed and it was decided to taper off of Effexor and start Cymbalta for coverage of MDD and fibromyalgia symptoms.  Risk and benefits were discussed.  It was also decided to change Topamax dosing to the morning to see if it provided any migraine relief.      Plan: - Discontinue Effexor 37.5 mg QD - Start Cymbalta 30 mg QD - Change Topamax to 25 mg QAM managed by PCP to prevent headaches - Lyrica 150 mg BID managed by PCP, seems to have helped - CBC,CMP, TSH, and UA reviewed - Follow up in a month  History of Present Illness:  Patient presented alongside her husband and reports that she is not entirely sure why she is here. She notes that she has a number of medical problems including chronic pain with no identifiable source, migraines, and a  UTI that was causing confusion and difficult to treat.  She also has a past history of MDD for which she saw Dr. Modesta Messing.  On further exploration patient reports she been doing fairly well living with her husband.  She has lived with him in [place] since 2006 and they enjoy spending time together, playing cards with friends a couple times a week, and keeping in touch with her kids/grandkids.  Mc likes to swim and will swim often every morning although recently she has been more fatigued and has only been able to walk in the pool.  She reports that she does have some increased fatigue since increasing her pregabalin to 150 twice a day but notes that it also does improve her pain.  As for her chronic migraines patient reports no noticeable benefit from the Topamax.  For her depression she takes the Effexor and is unsure if it is having any benefit, though she denies any depressive symptoms at this time.  We discussed treatment options going forward including getting off of the Effexor which patient was interested in though notes experiencing withdrawal symptoms of increased irritability when discontinuing in the past.  It was suggested that they try to taper off and start Cymbalta for the benefit of treatment for fibromyalgia and patient expressed interest.  Risk and benefits were discussed.  Associated Signs/Symptoms: Depression Symptoms:   Denies (Hypo) Manic Symptoms:   Denies Anxiety Symptoms:   Denies Psychotic Symptoms:   Denies PTSD Symptoms: Denies  Past Psychiatric  History: Patient has seen Dr. Modesta Messing in the past Psychiatry admission: denies  Previous suicide attempt: denies Past trials of medication: citalopram, and Effexor.  Patient has also taken Topamax for migraines and pregabalin for small fiber neuropathy.  Previous Psychotropic Medications: Yes   Substance Abuse History in the last 12 months:  No.  Consequences of Substance Abuse: NA  Past Medical History:  Past  Medical History:  Diagnosis Date   Adenomatous colon polyp    Anxiety    PHOBIAS   Arthritis    Breast cancer ([place]) 08/08/13   right LOQ   Cataract    Chronic insomnia    Cluster headaches    history of migraines / NONE FOR SEVERAL YRS   Depression    Diverticulosis    Fatty liver 2011   Fibromyalgia    GERD (gastroesophageal reflux disease)    H/O hiatal hernia    History of transfusion 08/30/2013   Hx of radiation therapy 10/29/13- 12/14/13   right chest wall 5040 cGy 28 sessions, right supraclavicular/axillary region 5040 cGy 28 sessions, right chest wall boost 1000 cGy 5 sessions   Hypothyroidism    Internal hemorrhoids    Irritable bowel syndrome    Kidney stone    Lymphedema    RT ARM - WEARS SLEEVE   Macular degeneration    hole/right eye   MDD (major depressive disorder)    Osteopenia    Other abnormal glucose    Other and unspecified hyperlipidemia    Pain in joint, shoulder region    Pneumonia 2993,7169   Sleep apnea    USES C-PAP   Stress incontinence, female     Past Surgical History:  Procedure Laterality Date   ABDOMINAL HYSTERECTOMY     APPENDECTOMY     BILATERAL TOTAL MASTECTOMY WITH AXILLARY LYMPH NODE DISSECTION  08/30/2013   Dr Barry Dienes   BREAST CYST ASPIRATION     9 cysts   CATARACT EXTRACTION, BILATERAL  2005/2007   CHOLECYSTECTOMY     COLONOSCOPY     CYSTOCELE REPAIR     EVACUATION BREAST HEMATOMA Left 08/31/2013   Procedure: EVACUATION HEMATOMA BREAST;  Surgeon: Stark Klein, MD;  Location: [facility];  Service: General;  Laterality: Left;   EYE SURGERY     to repair macular hole   FOOT ARTHROPLASTY     lt    GANGLION CYST EXCISION     rt foot   HEMORRHOID SURGERY     03/1993   JOINT REPLACEMENT  03/15/11   left knee replacement   KNEE ARTHROSCOPY     /partial knee 2016/left knee 2012   MASS EXCISION  11/04/2011   Procedure: EXCISION MASS;  Surgeon: Cammie Sickle., MD;  Location: [facility];  Service: Orthopedics;   Laterality: Right;  excisional biopsy right ulna mass   MASTECTOMY W/ SENTINEL NODE BIOPSY Right 08/30/2013   Procedure: RIGHT  AXILLARY SENTINEL LYMPH NODE BIOPSY; Right Axillary Node Disection;  Surgeon: Stark Klein, MD;  Location: [facility];  Service: General;  Laterality: Right;  Right side nuc med 7:00    PARTIAL KNEE ARTHROPLASTY Right 11/03/2015   Procedure: RIGHT KNEE MEDIAL UNICOMPARTMENTAL ARTHROPLASTY ;  Surgeon: Gaynelle Arabian, MD;  Location: [facility];  Service: Orthopedics;  Laterality: Right;   RECTOCELE REPAIR     SIMPLE MASTECTOMY WITH AXILLARY SENTINEL NODE BIOPSY Left 08/30/2013   Procedure: Bilateral Breast Mastectomy ;  Surgeon: Stark Klein, MD;  Location: [facility];  Service: General;  Laterality: Left;  skin tags removed     breast, panty line, neckline   TOE SURGERY     preventative crossover toe surg/right foot   TOE SURGERY  2009   left foot/screw  in 2nd toe   TONSILLECTOMY     UPPER GASTROINTESTINAL ENDOSCOPY      Family Psychiatric History: denies  Family History:  Family History  Problem Relation Age of Onset   Stroke Mother        died age 62   Diabetes Mother    Breast cancer Mother 80   Breast cancer Sister 70   Breast cancer Paternal Aunt 55   Diabetes Maternal Grandfather    Breast cancer Paternal Grandmother 80   Breast cancer Paternal Aunt        dx in her 61s   Cancer Maternal Grandmother        intra-abdominal cancer   Brain cancer Maternal Uncle 22   Brain cancer Cousin 12       maternal cousin   Brain cancer Cousin 72       paternal cousin   Colon cancer Neg Hx     Social History:   Social History   Socioeconomic History   Marital status: Married    Spouse name: Fritz Pickerel   Number of children: 3   Years of education: Not on file   Highest education level: Not on file  Occupational History   Occupation: retired bookkeeper  Tobacco Use   Smoking status: Former    Packs/day: 0.10    Years: 2.00    Total pack years: 0.20    Types:  Cigarettes    Start date: 11/16/1959    Quit date: 11/15/1960    Years since quitting: 61.7    Passive exposure: Past (over 71 years ago)   Smokeless tobacco: Never  Vaping Use   Vaping Use: Never used  Substance and Sexual Activity   Alcohol use: Yes    Comment: rarely   Drug use: No   Sexual activity: Yes    Comment: menarche age 37, fist live birth 75, P 3, hysterectomy age 29, no HRT, BCP 2 yrs  Other Topics Concern   Not on file  Social History Narrative   Occupation:  Retired Radiation protection practitioner    Married with 3 grown children      Never Smoked     Alcohol use-no        Social Determinants of Health   Financial Resource Strain: Low Risk  (09/15/2021)   Overall Financial Resource Strain (CARDIA)    Difficulty of Paying Living Expenses: Not hard at all  Food Insecurity: No Food Insecurity (09/15/2021)   Hunger Vital Sign    Worried About Running Out of Food in the Last Year: Never true    Ran Out of Food in the Last Year: Never true  Transportation Needs: No Transportation Needs (09/15/2021)   PRAPARE - Hydrologist (Medical): No    Lack of Transportation (Non-Medical): No  Physical Activity: Inactive (09/15/2021)   Exercise Vital Sign    Days of Exercise per Week: 0 days    Minutes of Exercise per Session: 30 min  Stress: Stress Concern Present (09/15/2021)   [place]    Feeling of Stress : To some extent  Social Connections: Socially Integrated (09/15/2021)   Social Connection and Isolation Panel [NHANES]    Frequency of Communication with Friends and Family: More than three times a  week    Frequency of Social Gatherings with Friends and Family: Twice a week    Attends Religious Services: More than 4 times per year    Active Member of [employer] or Organizations: Yes    Attends Music therapist: More than 4 times per year    Marital Status: Married    Additional Social  History: Married for 69 years, three children and numerous grandchildren. 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  Allergies:  No Known Allergies  Metabolic Disorder Labs: Lab Results  Component Value Date   HGBA1C 6.2 05/25/2022   MPG 126 (H) 08/27/2013   MPG 117 (H) 06/22/2013   Lab Results  Component Value Date   PROLACTIN 3.9 07/22/2009   Lab Results  Component Value Date   CHOL 202 (H) 05/25/2022   TRIG 80.0 05/25/2022   HDL 52.80 05/25/2022   CHOLHDL 4 05/25/2022   VLDL 16.0 05/25/2022   LDLCALC 133 (H) 05/25/2022   LDLCALC 138 (H) 03/06/2021   Lab Results  Component Value Date   TSH 1.89 05/25/2022    Therapeutic Level Labs: No results found for: "LITHIUM" No results found for: "CBMZ" No results found for: "VALPROATE"  Current Medications: Current Outpatient Medications  Medication Sig Dispense Refill   DULoxetine (CYMBALTA) 30 MG capsule Take 1 capsule (30 mg total) by mouth daily. 30 capsule 1   Bacillus Coagulans-Inulin (PROBIOTIC) 1-250 BILLION-MG CAPS Take by mouth.     butalbital-acetaminophen-caffeine (FIORICET) 50-325-40 MG tablet Take 1 tablet by mouth as needed for headache. 30 tablet 2   Calcium Carbonate-Vitamin D (CALCIUM-VITAMIN D3 PO) Take 1 tablet by mouth in the morning and at bedtime.     cephALEXin (KEFLEX) 500 MG capsule Take 1 capsule (500 mg total) by mouth 4 (four) times daily for 6 days. 24 capsule 0   Cholecalciferol (VITAMIN D3) 50 MCG (2000 UT) TABS Take 2 tablets by mouth daily.     diazepam (VALIUM) 5 MG tablet Take 5 mg by mouth every 6 (six) hours as needed for anxiety. As needed (flying & dental)     dicyclomine (BENTYL) 10 MG capsule Take 1 capsule (10 mg total) by mouth 4 (four) times daily -  before meals and at bedtime for 5 days. (Patient not taking: Reported on 07/26/2022) 20 capsule 0   esomeprazole (NEXIUM) 40 MG capsule Take 1 capsule (40 mg  total) by mouth daily at 12 noon. (Patient taking differently: Take 40 mg by mouth daily before breakfast.) 365 capsule 0   estradiol (ESTRACE) 0.1 MG/GM vaginal cream Place 0.5 g vaginally 2 (two) times a week. Place 0.5g nightly for two weeks then twice a week after (Patient not taking: Reported on 07/26/2022) 30 g 11   famotidine (PEPCID) 40 MG tablet TAKE ONE TABLET BY MOUTH EVERY NIGHT AT BEDTIME 90 tablet 1   levothyroxine (SYNTHROID) 100 MCG tablet TAKE ONE TABLET BY MOUTH DAILY 365 tablet 0   MELATONIN PO Take 1 tablet by mouth daily.     mirabegron ER (MYRBETRIQ) 50 MG TB24 tablet Take 1 tablet (50 mg total) by mouth daily. 90 tablet 3   montelukast (SINGULAIR) 10 MG tablet Take 1 tablet (10 mg total) by mouth at bedtime. 90 tablet 1   nitrofurantoin, macrocrystal-monohydrate, (MACROBID) 100 MG capsule Take 1 capsule (100 mg total) by mouth at bedtime. 90 capsule 3   omega-3 acid  ethyl esters (LOVAZA) 1 g capsule TAKE ONE CAPSULE BY MOUTH TWICE A DAY 180 capsule 3   Polyethylene Glycol 3350 (MIRALAX PO) Take by mouth. 1/2 capful every morning     pregabalin (LYRICA) 150 MG capsule Take 1 capsule (150 mg total) by mouth 2 (two) times daily. 180 capsule 0   rosuvastatin (CRESTOR) 20 MG tablet Take 1 tablet (20 mg total) by mouth daily. 90 tablet 2   topiramate (TOPAMAX) 25 MG tablet TAKE ONE TABLET BY MOUTH DAILY 325 tablet 0   venlafaxine XR (EFFEXOR-XR) 37.5 MG 24 hr capsule TAKE ONE CAPSULE BY MOUTH DAILY WITH BREAKFAST 90 capsule 1   No current facility-administered medications for this visit.    Musculoskeletal: Strength & Muscle Tone: within normal limits Gait & Station: normal Patient leans: N/A  Psychiatric Specialty Exam: Review of Systems  There were no vitals taken for this visit.There is no height or weight on file to calculate BMI.  General Appearance: Well Groomed  Eye Contact:  Good  Speech:  Clear and Coherent and Normal Rate  Volume:  Normal  Mood:  Euthymic   Affect:  Congruent  Thought Process:  Coherent, Goal Directed, and Linear  Orientation:  Full (Time, Place, and Person)  Thought Content:  Logical  Suicidal Thoughts:  No  Homicidal Thoughts:  No  Memory:  Immediate;   Good  Judgement:  Good  Insight:  Fair  Psychomotor Activity:  Normal  Concentration:  Concentration: Good  Recall:  Good  Fund of Knowledge:Fair  Language: Good  Akathisia:  No    AIMS (if indicated):  not done  Assets:  Communication Skills Housing Intimacy Social Support  ADL's:  Intact  Cognition: WNL  Sleep:  Good   Screenings: PHQ2-9    [facility] Office Visit from 08/11/2022 in [facility] Visit from 06/30/2022 in [facility] from 03/18/2022 in [facility] from 12/03/2021 in [facility] from 07/22/2021 in [facility] Total Score 0 3 0 0 0  PHQ-9 Total Score -- 11 0 0 --      [facility] Office Visit from 08/11/2022 in [facility] from 08/08/2022 in [facility] ED from 03/15/2022 in [facility]  C-SSRS RISK CATEGORY No Risk No Risk No Risk        Collaboration of Care: Medication Management AEB medication prescription  Patient/Guardian was advised Release of Information must be obtained prior to any record release in order to collaborate their care with an outside provider. Patient/Guardian was advised if they have not already done so to contact the registration department to sign all necessary forms in order for [name] to release information regarding their care.   Consent: Patient/Guardian gives verbal consent for treatment and assignment of benefits for services provided during this visit. Patient/Guardian expressed understanding and agreed to proceed.   Vista Mink,  MD 9/27/20233:19 PM

## 2022-08-11 NOTE — Patient Instructions (Signed)
Thank you for attending your appointment today.  -- Change Topamax to the morning -- Discontinue Effexor 37.5 mg when you start Cymbalta 30 mg in the morning  Please do not make any changes to medications without first discussing with your provider. If you are experiencing a psychiatric emergency, please call 911 or present to your nearest emergency department. Additional crisis, medication management, and therapy resources are included below.  [facility]  [address] [place], [place] [address] [phone] or [phone] [facility] 24/7 FOR ANYONE [address] [place], [place] Fax: [phone] [email or web address] *Interpreters available *Accepts all insurance and uninsured for Urgent Care needs

## 2022-08-12 ENCOUNTER — Encounter: Payer: Self-pay | Admitting: Adult Health

## 2022-08-12 ENCOUNTER — Ambulatory Visit (INDEPENDENT_AMBULATORY_CARE_PROVIDER_SITE_OTHER): Payer: Medicare HMO | Admitting: Adult Health

## 2022-08-12 VITALS — BP 106/60 | HR 78 | Temp 98.3°F | Ht 59.0 in | Wt 166.0 lb

## 2022-08-12 DIAGNOSIS — Z8669 Personal history of other diseases of the nervous system and sense organs: Secondary | ICD-10-CM | POA: Diagnosis not present

## 2022-08-12 DIAGNOSIS — L03031 Cellulitis of right toe: Secondary | ICD-10-CM

## 2022-08-12 DIAGNOSIS — N3281 Overactive bladder: Secondary | ICD-10-CM | POA: Diagnosis not present

## 2022-08-12 MED ORDER — TRAMADOL HCL 50 MG PO TABS: 50.0000 mg | ORAL_TABLET | Freq: Three times a day (TID) | ORAL | 0 refills | Status: AC | PRN

## 2022-08-12 NOTE — Progress Notes (Signed)
Subjective:    [name], female    DOB: [birth year], 81 y.o.   MRN: [id number]  HPI She presents to the office today for follow-up after being seen in the emergency room 4 days ago with cellulitis of the great right toe.  He was discharged with Keflex 4 times daily for 6 days.  Today she reports improvement overall, she continues to have a small amount of swelling and redness towards the nail bed.   She also reports that since starting Myrbetriq for OAB that the frequency in urination has decreased by about 50%, she is happy with the results.   She also needs a refill of Tramadol that she takes for headaches. Her last refill was in 2020, she uses it sparingly    Review of Systems See HPI   Past Medical History:  Diagnosis Date   Adenomatous colon polyp    Anxiety    PHOBIAS   Arthritis    Breast cancer ([place]) 08/08/13   right LOQ   Cataract    Chronic insomnia    Cluster headaches    history of migraines / NONE FOR SEVERAL YRS   Depression    Diverticulosis    Fatty liver 2011   Fibromyalgia    GERD (gastroesophageal reflux disease)    H/O hiatal hernia    History of transfusion 08/30/2013   Hx of radiation therapy 10/29/13- 12/14/13   right chest wall 5040 cGy 28 sessions, right supraclavicular/axillary region 5040 cGy 28 sessions, right chest wall boost 1000 cGy 5 sessions   Hypothyroidism    Internal hemorrhoids    Irritable bowel syndrome    Kidney stone    Lymphedema    RT ARM - WEARS SLEEVE   Macular degeneration    hole/right eye   MDD (major depressive disorder)    Osteopenia    Other abnormal glucose    Other and unspecified hyperlipidemia    Pain in joint, shoulder region    Pneumonia 2585,2778   Sleep apnea    USES C-PAP   Stress incontinence, female     Social History   Socioeconomic History   Marital status: Married    Spouse name: Fritz Pickerel   Number of children: 3   Years of education: Not on file   Highest education level: Not  on file  Occupational History   Occupation: retired bookkeeper  Tobacco Use   Smoking status: Former    Packs/day: 0.10    Years: 2.00    Total pack years: 0.20    Types: Cigarettes    Start date: 11/16/1959    Quit date: 11/15/1960    Years since quitting: 61.7    Passive exposure: Past (over 20 years ago)   Smokeless tobacco: Never  Vaping Use   Vaping Use: Never used  Substance and Sexual Activity   Alcohol use: Yes    Comment: rarely   Drug use: No   Sexual activity: Yes    Comment: menarche age 72, fist live birth 10, P 3, hysterectomy age 36, no HRT, BCP 2 yrs  Other Topics Concern   Not on file  Social History Narrative   Occupation:  Retired Radiation protection practitioner    Married with 3 grown children      Never Smoked     Alcohol use-no        Social Determinants of Health   Financial Resource Strain: Low Risk  (09/15/2021)   Overall Financial Resource Strain (CARDIA)    Difficulty of  Paying Living Expenses: Not hard at all  Food Insecurity: No Food Insecurity (09/15/2021)   Hunger Vital Sign    Worried About Running Out of Food in the Last Year: Never true    Ran Out of Food in the Last Year: Never true  Transportation Needs: No Transportation Needs (09/15/2021)   PRAPARE - Hydrologist (Medical): No    Lack of Transportation (Non-Medical): No  Physical Activity: Unknown (09/15/2021)   Exercise Vital Sign    Days of Exercise per Week: 0 days    Minutes of Exercise per Session: Not on file  Recent Concern: Physical Activity - Inactive (09/15/2021)   Exercise Vital Sign    Days of Exercise per Week: 0 days    Minutes of Exercise per Session: 30 min  Stress: Stress Concern Present (09/15/2021)   [place]    Feeling of Stress : To some extent  Social Connections: Socially Integrated (09/15/2021)   Social Connection and Isolation Panel [NHANES]    Frequency of Communication with Friends  and Family: More than three times a week    Frequency of Social Gatherings with Friends and Family: Twice a week    Attends Religious Services: More than 4 times per year    Active Member of Clubs or Organizations: Yes    Attends Archivist Meetings: More than 4 times per year    Marital Status: Married  Human resources officer Violence: Not At Risk (07/22/2021)   Humiliation, Afraid, Rape, and Kick questionnaire    Fear of Current or Ex-Partner: No    Emotionally Abused: No    Physically Abused: No    Sexually Abused: No    Past Surgical History:  Procedure Laterality Date   ABDOMINAL HYSTERECTOMY     APPENDECTOMY     BILATERAL TOTAL MASTECTOMY WITH AXILLARY LYMPH NODE DISSECTION  08/30/2013   Dr Barry Dienes   BREAST CYST ASPIRATION     9 cysts   CATARACT EXTRACTION, BILATERAL  2005/2007   CHOLECYSTECTOMY     COLONOSCOPY     CYSTOCELE REPAIR     EVACUATION BREAST HEMATOMA Left 08/31/2013   Procedure: EVACUATION HEMATOMA BREAST;  Surgeon: Stark Klein, MD;  Location: [facility];  Service: General;  Laterality: Left;   EYE SURGERY     to repair macular hole   FOOT ARTHROPLASTY     lt    GANGLION CYST EXCISION     rt foot   HEMORRHOID SURGERY     03/1993   JOINT REPLACEMENT  03/15/11   left knee replacement   KNEE ARTHROSCOPY     /partial knee 2016/left knee 2012   MASS EXCISION  11/04/2011   Procedure: EXCISION MASS;  Surgeon: Cammie Sickle., MD;  Location: [place];  Service: Orthopedics;  Laterality: Right;  excisional biopsy right ulna mass   MASTECTOMY W/ SENTINEL NODE BIOPSY Right 08/30/2013   Procedure: RIGHT  AXILLARY SENTINEL LYMPH NODE BIOPSY; Right Axillary Node Disection;  Surgeon: Stark Klein, MD;  Location: [facility];  Service: General;  Laterality: Right;  Right side nuc med 7:00    PARTIAL KNEE ARTHROPLASTY Right 11/03/2015   Procedure: RIGHT KNEE MEDIAL UNICOMPARTMENTAL ARTHROPLASTY ;  Surgeon: Gaynelle Arabian, MD;  Location: [facility];  Service:  Orthopedics;  Laterality: Right;   RECTOCELE REPAIR     SIMPLE MASTECTOMY WITH AXILLARY SENTINEL NODE BIOPSY Left 08/30/2013   Procedure: Bilateral Breast Mastectomy ;  Surgeon:  Stark Klein, MD;  Location: [facility];  Service: General;  Laterality: Left;   skin tags removed     breast, panty line, neckline   TOE SURGERY     preventative crossover toe surg/right foot   TOE SURGERY  2009   left foot/screw  in 2nd toe   TONSILLECTOMY     UPPER GASTROINTESTINAL ENDOSCOPY      Family History  Problem Relation Age of Onset   Stroke Mother        died age 36   Diabetes Mother    Breast cancer Mother 16   Breast cancer Sister 42   Breast cancer Paternal Aunt 65   Diabetes Maternal Grandfather    Breast cancer Paternal Grandmother 60   Breast cancer Paternal Aunt        dx in her 69s   Cancer Maternal Grandmother        intra-abdominal cancer   Brain cancer Maternal Uncle 8   Brain cancer Cousin 21       maternal cousin   Brain cancer Cousin 81       paternal cousin   Colon cancer Neg Hx     No Known Allergies  Current Outpatient Medications on File Prior to Visit  Medication Sig Dispense Refill   Bacillus Coagulans-Inulin (PROBIOTIC) 1-250 BILLION-MG CAPS Take by mouth.     butalbital-acetaminophen-caffeine (FIORICET) 50-325-40 MG tablet Take 1 tablet by mouth as needed for headache. 30 tablet 2   Calcium Carbonate-Vitamin D (CALCIUM-VITAMIN D3 PO) Take 1 tablet by mouth in the morning and at bedtime.     cephALEXin (KEFLEX) 500 MG capsule Take 1 capsule (500 mg total) by mouth 4 (four) times daily for 6 days. 24 capsule 0   Cholecalciferol (VITAMIN D3) 50 MCG (2000 UT) TABS Take 2 tablets by mouth daily.     diazepam (VALIUM) 5 MG tablet Take 5 mg by mouth every 6 (six) hours as needed for anxiety. As needed (flying & dental)     dicyclomine (BENTYL) 10 MG capsule Take 1 capsule (10 mg total) by mouth 4 (four) times daily -  before meals and at bedtime for 5 days. (Patient not  taking: Reported on 07/26/2022) 20 capsule 0   DULoxetine (CYMBALTA) 30 MG capsule Take 1 capsule (30 mg total) by mouth daily. 30 capsule 1   esomeprazole (NEXIUM) 40 MG capsule Take 1 capsule (40 mg total) by mouth daily at 12 noon. (Patient taking differently: Take 40 mg by mouth daily before breakfast.) 365 capsule 0   estradiol (ESTRACE) 0.1 MG/GM vaginal cream Place 0.5 g vaginally 2 (two) times a week. Place 0.5g nightly for two weeks then twice a week after (Patient not taking: Reported on 07/26/2022) 30 g 11   famotidine (PEPCID) 40 MG tablet TAKE ONE TABLET BY MOUTH EVERY NIGHT AT BEDTIME 90 tablet 1   levothyroxine (SYNTHROID) 100 MCG tablet TAKE ONE TABLET BY MOUTH DAILY 365 tablet 0   MELATONIN PO Take 1 tablet by mouth daily.     mirabegron ER (MYRBETRIQ) 50 MG TB24 tablet Take 1 tablet (50 mg total) by mouth daily. 90 tablet 3   montelukast (SINGULAIR) 10 MG tablet Take 1 tablet (10 mg total) by mouth at bedtime. 90 tablet 1   nitrofurantoin, macrocrystal-monohydrate, (MACROBID) 100 MG capsule Take 1 capsule (100 mg total) by mouth at bedtime. 90 capsule 3   omega-3 acid ethyl esters (LOVAZA) 1 g capsule TAKE ONE CAPSULE BY MOUTH TWICE A DAY 180 capsule  3   Polyethylene Glycol 3350 (MIRALAX PO) Take by mouth. 1/2 capful every morning     pregabalin (LYRICA) 150 MG capsule Take 1 capsule (150 mg total) by mouth 2 (two) times daily. 180 capsule 0   rosuvastatin (CRESTOR) 20 MG tablet Take 1 tablet (20 mg total) by mouth daily. 90 tablet 2   topiramate (TOPAMAX) 25 MG tablet TAKE ONE TABLET BY MOUTH DAILY 325 tablet 0   venlafaxine XR (EFFEXOR-XR) 37.5 MG 24 hr capsule TAKE ONE CAPSULE BY MOUTH DAILY WITH BREAKFAST 90 capsule 1   No current facility-administered medications on file prior to visit.    There were no vitals taken for this visit.      Objective:   Physical Exam Vitals and nursing note reviewed.  Constitutional:      Appearance: Normal appearance.  Skin:     General: Skin is warm and dry.     Comments: Mild redness and pain to right great toe. No warmth noted  Neurological:     General: No focal deficit present.     Mental Status: She is alert and oriented to person, place, and time.  Psychiatric:        Mood and Affect: Mood normal.        Behavior: Behavior normal.        Thought Content: Thought content normal.        Judgment: Judgment normal.       Assessment & Plan:  1. Cellulitis of great toe of right foot - Finish abx - Follow up if not resolved by early next week   2. OAB (overactive bladder) - Continue with myrbetriq 50 mg daily   3. Hx of migraine headaches  - traMADol (ULTRAM) 50 MG tablet; Take 1 tablet (50 mg total) by mouth every 8 (eight) hours as needed for up to 5 days.  Dispense: 30 tablet; Refill: 0  Time spent with patient today was 33 minutes which consisted of chart review, discussing diagnosis, work up, treatment answering questions and documentation.

## 2022-08-25 ENCOUNTER — Other Ambulatory Visit: Payer: Self-pay | Admitting: Adult Health

## 2022-08-25 DIAGNOSIS — Z760 Encounter for issue of repeat prescription: Secondary | ICD-10-CM

## 2022-09-01 ENCOUNTER — Encounter: Payer: Self-pay | Admitting: Adult Health

## 2022-09-02 ENCOUNTER — Other Ambulatory Visit: Payer: Self-pay | Admitting: Adult Health

## 2022-09-02 MED ORDER — ROSUVASTATIN CALCIUM 20 MG PO TABS: 20.0000 mg | ORAL_TABLET | Freq: Every day | ORAL | 0 refills | Status: DC

## 2022-09-03 ENCOUNTER — Encounter: Payer: Self-pay | Admitting: Adult Health

## 2022-09-06 ENCOUNTER — Encounter: Payer: Self-pay | Admitting: *Deleted

## 2022-09-07 ENCOUNTER — Ambulatory Visit: Payer: Medicare HMO | Admitting: Adult Health

## 2022-09-08 ENCOUNTER — Encounter (HOSPITAL_COMMUNITY): Payer: Self-pay | Admitting: Psychiatry

## 2022-09-08 ENCOUNTER — Ambulatory Visit (HOSPITAL_BASED_OUTPATIENT_CLINIC_OR_DEPARTMENT_OTHER): Payer: Medicare HMO | Admitting: Psychiatry

## 2022-09-08 VITALS — BP 143/85 | HR 93 | Temp 97.8°F | Wt 172.0 lb

## 2022-09-08 DIAGNOSIS — F3342 Major depressive disorder, recurrent, in full remission: Secondary | ICD-10-CM

## 2022-09-08 DIAGNOSIS — M797 Fibromyalgia: Secondary | ICD-10-CM

## 2022-09-08 MED ORDER — DULOXETINE HCL 60 MG PO CPEP: 60.0000 mg | ORAL_CAPSULE | Freq: Every day | ORAL | 1 refills | Status: DC

## 2022-09-08 NOTE — Progress Notes (Signed)
BH MD/PA/NP OP Progress Note  09/08/2022 1:28 PM Jean Nash  MRN:  [id number]  Visit Diagnosis:    ICD-10-CM   1. MDD (major depressive disorder), recurrent, in full remission ([place])  F33.42 DULoxetine (CYMBALTA) 60 MG capsule    2. Fibromyalgia  M79.7       Assessment: Jean Nash is a 81 y.o. y.o. female with a history of MDD, anxiety, and fibromyalgia who presented to [place] [facility] for initial evaluation on 08/11/22.    At initial evaluation patient reported a history of MDD but denied any current symptoms other than increased fatigue.  She noted that the increased fatigue seemed to correlate with when the pregabalin was increased for her fibromyalgia.  Patient denies any other psychiatric symptoms including psychosis, paranoia, delusions, SI/HI, thoughts of self-harm, or neurovegetative signs of depression.  Patient reported struggling with chronic pain symptoms as well as migraines with some benefit from the pregabalin.  She denied any benefit from the Topamax.  Treatment options were discussed and it was decided to taper off of Effexor and start Cymbalta for coverage of MDD and fibromyalgia symptoms.     Jean Nash presents for follow-up evaluation. Today, 09/08/22, patient reports some increased anxiety secondary to world events that affect her family.  She reports that her mood however has been stable, there has been some improvement in her pain, and that she is sleeping better at night.  Plan: - Increase Cymbalta to 60 mg QD - Discontinued Effexor - Continue Topamax 25 mg QAM managed by PCP to prevent headaches - Lyrica 150 mg BID managed by PCP, seems to have helped - CBC,CMP, TSH, and UA reviewed - Follow up in a month   Chief Complaint:  Chief Complaint  Patient presents with   Follow-up   HPI: Jean Nash presents alongside her husband reporting that the past month has been a bit tough with everything that is going on in the world.   She notes that her anxiety has gone up as she has family and [place] and is worried about their safety.  Currently everyone has been safe and she has been able to talk to them every day to check in.  Patient also had a GI illness the last few days which she is just recovering from.  She stopped the Effexor and started the Cymbalta and denies any side effects from the discontinuation of the Effexor.  As for the Cymbalta she is unsure if there has been much benefit but has noticed that her pain is decreased.  She also noticed a decrease in her migraines during the day after switching the Topamax to the morning.  Patient is not sure if it is related but notes that she is sleeping better now, and will only wake up once a night compared to the 3 times she used to wake up in the past.  She denies any adverse side effects on the medication and we discussed increasing to 60 today.  Risk and benefits were discussed.   Past Psychiatric History: Patient has seen Dr. Modesta Messing in the past Psychiatry admission: denies  Previous suicide attempt: denies Past trials of medication: citalopram, and Effexor.  Patient has also taken Topamax for migraines and pregabalin for small fiber neuropathy.  Past Medical History:  Past Medical History:  Diagnosis Date   Adenomatous colon polyp    Anxiety    PHOBIAS   Arthritis    Breast cancer ([place]) 08/08/13   right LOQ   Cataract  Chronic insomnia    Cluster headaches    history of migraines / NONE FOR SEVERAL YRS   Depression    Diverticulosis    Fatty liver 2011   Fibromyalgia    GERD (gastroesophageal reflux disease)    H/O hiatal hernia    History of transfusion 08/30/2013   Hx of radiation therapy 10/29/13- 12/14/13   right chest wall 5040 cGy 28 sessions, right supraclavicular/axillary region 5040 cGy 28 sessions, right chest wall boost 1000 cGy 5 sessions   Hypothyroidism    Internal hemorrhoids    Irritable bowel syndrome    Kidney stone    Lymphedema    RT  ARM - WEARS SLEEVE   Macular degeneration    hole/right eye   MDD (major depressive disorder)    Osteopenia    Other abnormal glucose    Other and unspecified hyperlipidemia    Pain in joint, shoulder region    Pneumonia [id number]   Sleep apnea    USES C-PAP   Stress incontinence, female     Past Surgical History:  Procedure Laterality Date   ABDOMINAL HYSTERECTOMY     APPENDECTOMY     BILATERAL TOTAL MASTECTOMY WITH AXILLARY LYMPH NODE DISSECTION  08/30/2013   Dr Barry Dienes   BREAST CYST ASPIRATION     9 cysts   CATARACT EXTRACTION, BILATERAL  2005/2007   CHOLECYSTECTOMY     COLONOSCOPY     CYSTOCELE REPAIR     EVACUATION BREAST HEMATOMA Left 08/31/2013   Procedure: EVACUATION HEMATOMA BREAST;  Surgeon: Stark Klein, MD;  Location: [place];  Service: General;  Laterality: Left;   EYE SURGERY     to repair macular hole   FOOT ARTHROPLASTY     lt    GANGLION CYST EXCISION     rt foot   HEMORRHOID SURGERY     03/1993   JOINT REPLACEMENT  03/15/11   left knee replacement   KNEE ARTHROSCOPY     /partial knee 2016/left knee 2012   MASS EXCISION  11/04/2011   Procedure: EXCISION MASS;  Surgeon: Cammie Sickle., MD;  Location: [place];  Service: Orthopedics;  Laterality: Right;  excisional biopsy right ulna mass   MASTECTOMY W/ SENTINEL NODE BIOPSY Right 08/30/2013   Procedure: RIGHT  AXILLARY SENTINEL LYMPH NODE BIOPSY; Right Axillary Node Disection;  Surgeon: Stark Klein, MD;  Location: [place];  Service: General;  Laterality: Right;  Right side nuc med 7:00    PARTIAL KNEE ARTHROPLASTY Right 11/03/2015   Procedure: RIGHT KNEE MEDIAL UNICOMPARTMENTAL ARTHROPLASTY ;  Surgeon: Gaynelle Arabian, MD;  Location: [facility];  Service: Orthopedics;  Laterality: Right;   RECTOCELE REPAIR     SIMPLE MASTECTOMY WITH AXILLARY SENTINEL NODE BIOPSY Left 08/30/2013   Procedure: Bilateral Breast Mastectomy ;  Surgeon: Stark Klein, MD;  Location: [place];  Service: General;   Laterality: Left;   skin tags removed     breast, panty line, neckline   TOE SURGERY     preventative crossover toe surg/right foot   TOE SURGERY  2009   left foot/screw  in 2nd toe   TONSILLECTOMY     UPPER GASTROINTESTINAL ENDOSCOPY      Family Psychiatric History: Denies  Family History:  Family History  Problem Relation Age of Onset   Stroke Mother        died age 53   Diabetes Mother    Breast cancer Mother 60   Breast cancer Sister 55   Breast  cancer Paternal Aunt 34   Diabetes Maternal Grandfather    Breast cancer Paternal Grandmother 49   Breast cancer Paternal Aunt        dx in her 30s   Cancer Maternal Grandmother        intra-abdominal cancer   Brain cancer Maternal Uncle 8   Brain cancer Cousin 53       maternal cousin   Brain cancer Cousin 48       paternal cousin   Colon cancer Neg Hx     Social History:  Social History   Socioeconomic History   Marital status: Married    Spouse name: Fritz Pickerel   Number of children: 3   Years of education: Not on file   Highest education level: Not on file  Occupational History   Occupation: retired bookkeeper  Tobacco Use   Smoking status: Former    Packs/day: 0.10    Years: 2.00    Total pack years: 0.20    Types: Cigarettes    Start date: 11/16/1959    Quit date: 11/15/1960    Years since quitting: 61.8    Passive exposure: Past (over 32 years ago)   Smokeless tobacco: Never  Vaping Use   Vaping Use: Never used  Substance and Sexual Activity   Alcohol use: Yes    Comment: rarely   Drug use: No   Sexual activity: Yes    Comment: menarche age 54, fist live birth 63, P 3, hysterectomy age 73, no HRT, BCP 2 yrs  Other Topics Concern   Not on file  Social History Narrative   Occupation:  Retired Radiation protection practitioner    Married with 3 grown children      Never Smoked     Alcohol use-no        Social Determinants of Health   Financial Resource Strain: Low Risk  (09/15/2021)   Overall Financial Resource Strain  (CARDIA)    Difficulty of Paying Living Expenses: Not hard at all  Food Insecurity: No Food Insecurity (09/15/2021)   Hunger Vital Sign    Worried About Running Out of Food in the Last Year: Never true    [place] in the Last Year: Never true  Transportation Needs: No Transportation Needs (09/15/2021)   PRAPARE - Hydrologist (Medical): No    Lack of Transportation (Non-Medical): No  Physical Activity: Unknown (09/15/2021)   Exercise Vital Sign    Days of Exercise per Week: 0 days    Minutes of Exercise per Session: Not on file  Recent Concern: Physical Activity - Inactive (09/15/2021)   Exercise Vital Sign    Days of Exercise per Week: 0 days    Minutes of Exercise per Session: 30 min  Stress: Stress Concern Present (09/15/2021)   [place]    Feeling of Stress : To some extent  Social Connections: Socially Integrated (09/15/2021)   Social Connection and Isolation Panel [NHANES]    Frequency of Communication with Friends and Family: More than three times a week    Frequency of Social Gatherings with Friends and Family: Twice a week    Attends Religious Services: More than 4 times per year    Active Member of [employer] or Organizations: Yes    Attends Music therapist: More than 4 times per year    Marital Status: Married    Allergies: No Known Allergies  Current Medications: Current Outpatient Medications  Medication Sig Dispense Refill   Bacillus Coagulans-Inulin (PROBIOTIC) 1-250 BILLION-MG CAPS Take by mouth.     butalbital-acetaminophen-caffeine (FIORICET) 50-325-40 MG tablet Take 1 tablet by mouth as needed for headache. 30 tablet 2   Calcium Carbonate-Vitamin D (CALCIUM-VITAMIN D3 PO) Take 1 tablet by mouth in the morning and at bedtime.     Cholecalciferol (VITAMIN D3) 50 MCG (2000 UT) TABS Take 2 tablets by mouth daily.     diazepam (VALIUM) 5 MG tablet Take 5 mg  by mouth every 6 (six) hours as needed for anxiety. As needed (flying & dental)     DULoxetine (CYMBALTA) 60 MG capsule Take 1 capsule (60 mg total) by mouth daily. 30 capsule 1   esomeprazole (NEXIUM) 40 MG capsule Take 1 capsule (40 mg total) by mouth daily at 12 noon. (Patient taking differently: Take 40 mg by mouth daily before breakfast.) 365 capsule 0   estradiol (ESTRACE) 0.1 MG/GM vaginal cream Place 0.5 g vaginally 2 (two) times a week. Place 0.5g nightly for two weeks then twice a week after 30 g 11   famotidine (PEPCID) 40 MG tablet TAKE ONE TABLET BY MOUTH EVERY NIGHT AT BEDTIME 90 tablet 1   levothyroxine (SYNTHROID) 100 MCG tablet TAKE ONE TABLET BY MOUTH DAILY 365 tablet 0   MELATONIN PO Take 1 tablet by mouth daily.     mirabegron ER (MYRBETRIQ) 50 MG TB24 tablet Take 1 tablet (50 mg total) by mouth daily. 90 tablet 3   montelukast (SINGULAIR) 10 MG tablet Take 1 tablet (10 mg total) by mouth at bedtime. 90 tablet 1   nitrofurantoin, macrocrystal-monohydrate, (MACROBID) 100 MG capsule Take 1 capsule (100 mg total) by mouth at bedtime. 90 capsule 3   omega-3 acid ethyl esters (LOVAZA) 1 g capsule TAKE ONE CAPSULE BY MOUTH TWICE A DAY 180 capsule 3   Polyethylene Glycol 3350 (MIRALAX PO) Take by mouth. 1/2 capful every morning     pregabalin (LYRICA) 150 MG capsule Take 1 capsule (150 mg total) by mouth 2 (two) times daily. 180 capsule 0   rosuvastatin (CRESTOR) 20 MG tablet Take 1 tablet (20 mg total) by mouth daily. 365 tablet 0   topiramate (TOPAMAX) 25 MG tablet TAKE ONE TABLET BY MOUTH DAILY 325 tablet 0   No current facility-administered medications for this visit.     Musculoskeletal: Strength & Muscle Tone: within normal limits Gait & Station: normal, unsteady Patient leans: N/A  Psychiatric Specialty Exam: Review of Systems  There were no vitals taken for this visit.There is no height or weight on file to calculate BMI.  General Appearance: Well Groomed  Eye  Contact:  Good  Speech:  Clear and Coherent and Normal Rate  Volume:  Normal  Mood:  Anxious and Euthymic  Affect:  Congruent  Thought Process:  Coherent and Goal Directed  Orientation:  Full (Time, Place, and Person)  Thought Content: Logical   Suicidal Thoughts:  No  Homicidal Thoughts:  No  Memory:  NA  Judgement:  Good  Insight:  Good  Psychomotor Activity:  Normal  Concentration:  Concentration: Good  Recall:  [place] of Knowledge: Fair  Language: Good  Akathisia:  NA    AIMS (if indicated): not done  Assets:  Communication Skills Desire for Improvement Housing Intimacy Leisure Time Resilience Social Support  ADL's:  Intact  Cognition: WNL  Sleep:  Good   Metabolic Disorder Labs: Lab Results  Component Value Date   HGBA1C 6.2 05/25/2022   MPG  126 (H) 08/27/2013   MPG 117 (H) 06/22/2013   Lab Results  Component Value Date   PROLACTIN 3.9 07/22/2009   Lab Results  Component Value Date   CHOL 202 (H) 05/25/2022   TRIG 80.0 05/25/2022   HDL 52.80 05/25/2022   CHOLHDL 4 05/25/2022   VLDL 16.0 05/25/2022   LDLCALC 133 (H) 05/25/2022   LDLCALC 138 (H) 03/06/2021   Lab Results  Component Value Date   TSH 1.89 05/25/2022   TSH 0.62 03/03/2022    Therapeutic Level Labs: No results found for: "LITHIUM" No results found for: "VALPROATE" No results found for: "CBMZ"   Screenings: [place] Office Visit from 08/11/2022 in [facility] Visit from 06/30/2022 in [place] [facility] [place] from 03/18/2022 in [place] [facility] [place] from 12/03/2021 in [facility] [place] from 07/22/2021 in [place] [facility] Total Score 0 3 0 0 0  PHQ-9 Total Score -- 11 0 0 --      [place] Visit from 08/11/2022 in [facility] from 08/08/2022 in [facility] ED from 03/15/2022 in [facility]  C-SSRS RISK CATEGORY No Risk No Risk No Risk       Collaboration of Care: Collaboration of Care: Medication Management AEB medication prescription  Patient/Guardian was advised Release of Information must be obtained prior to any record release in order to collaborate their care with an outside provider. Patient/Guardian was advised if they have not already done so to contact the registration department to sign all necessary forms in order for [name] to release information regarding their care.   Consent: Patient/Guardian gives verbal consent for treatment and assignment of benefits for services provided during this visit. Patient/Guardian expressed understanding and agreed to proceed.    Vista Mink, MD 09/08/2022, 1:28 PM

## 2022-09-10 ENCOUNTER — Telehealth: Payer: Self-pay | Admitting: Adult Health

## 2022-09-10 DIAGNOSIS — M797 Fibromyalgia: Secondary | ICD-10-CM

## 2022-09-10 NOTE — Telephone Encounter (Signed)
Requesting refill of pregabalin (LYRICA) 150 MG capsule (Expired), says dosage was adjusted and she has ran out.  [facility] [id number] - [place] [address], [place] Phone:  [phone]  Fax:  [phone]

## 2022-09-13 MED ORDER — PREGABALIN 150 MG PO CAPS: 150.0000 mg | ORAL_CAPSULE | Freq: Two times a day (BID) | ORAL | 0 refills | Status: DC

## 2022-09-13 NOTE — Telephone Encounter (Signed)
Noted  

## 2022-09-13 NOTE — Telephone Encounter (Signed)
I sent in a 30 day supply

## 2022-09-13 NOTE — Addendum Note (Signed)
Addended by: Alysia Penna A on: 09/13/2022 01:56 PM   Modules accepted: Orders

## 2022-09-21 ENCOUNTER — Other Ambulatory Visit: Payer: Self-pay | Admitting: Adult Health

## 2022-09-21 MED ORDER — NITROFURANTOIN MONOHYD MACRO 100 MG PO CAPS: 100.0000 mg | ORAL_CAPSULE | Freq: Every day | ORAL | 0 refills | Status: DC

## 2022-10-11 ENCOUNTER — Encounter (HOSPITAL_COMMUNITY): Payer: Self-pay | Admitting: Psychiatry

## 2022-10-11 ENCOUNTER — Ambulatory Visit (HOSPITAL_BASED_OUTPATIENT_CLINIC_OR_DEPARTMENT_OTHER): Payer: Medicare HMO | Admitting: Psychiatry

## 2022-10-11 DIAGNOSIS — F3342 Major depressive disorder, recurrent, in full remission: Secondary | ICD-10-CM

## 2022-10-11 MED ORDER — DULOXETINE HCL 60 MG PO CPEP: 60.0000 mg | ORAL_CAPSULE | Freq: Every day | ORAL | 1 refills | Status: DC

## 2022-10-11 NOTE — Progress Notes (Signed)
BH MD/PA/NP OP Progress Note  10/11/2022 1:58 PM Jean Nash  MRN:  [id number]  Visit Diagnosis:    ICD-10-CM   1. MDD (major depressive disorder), recurrent, in full remission [facility])  F33.42       Assessment: Jean Nash is a 81 y.o. y.o. female with a history of MDD, anxiety, and fibromyalgia who presented to [facility] [facility] for initial evaluation on 08/11/22.    At initial evaluation patient reported a history of MDD but denied any current symptoms other than increased fatigue.  She noted that the increased fatigue seemed to correlate with when the pregabalin was increased for her fibromyalgia.  Patient denies any other psychiatric symptoms including psychosis, paranoia, delusions, SI/HI, thoughts of self-harm, or neurovegetative signs of depression.  Patient reported struggling with chronic pain symptoms as well as migraines with some benefit from the pregabalin.  She denied any benefit from the Topamax.  Treatment options were discussed and it was decided to taper off of Effexor and start Cymbalta for coverage of MDD and fibromyalgia symptoms.     Jean Nash presents for follow-up evaluation. Today, 10/11/22, reports that her moods been pretty good over the past month with some minor anxiety secondary to world events and family in the affected area.  Patient reports no adverse side effects on the Cymbalta and feels that it has helped with her pain symptoms.  Her husband has noticed some increased fatigue this past month.  We will try switching the Cymbalta to bedtime to see if there is any improvement, if no improvement then she can consider switching the Topamax to bedtime while monitoring to see if migraines get worse again.  Continue on her current regimen and follow-up in 3 months.   Plan: - Change Cymbalta to 60 mg QHS - Discontinued Effexor - Continue Topamax 25 mg QAM managed by PCP to prevent headaches - Lyrica 150 mg BID managed by PCP,  seems to have helped - CBC,CMP, TSH, and UA reviewed - Follow up in 3 months   Chief Complaint:  Chief Complaint  Patient presents with   Follow-up   HPI: Jean Nash presents alongside her husband.  She reports that the last month has gone fairly well other than some anxiety with the events going on in [place].  Patient reports that the increase in Cymbalta has seemed to help with her pain to some degree.  She does not recognize any adverse side effects from the medication and thinks her mood is good.  Patient's husband reports that he has noticed that she has been more lethargic lately.  He reports that she spends a lot of time sitting in her chair watching TV, and looking out the window at the squirrels playing on the feeder.  He does not believe that she is depressed just that she has had a little bit less energy and the pain makes it hard to do more physically active things.  We discussed the possibility that medications could be affecting this change during the days, though it is still possible that it is just related to age and pain.  We suggested trying the Cymbalta at bedtime for a month to see if this improvement.  If no improvement patient can try switching the Topamax back to bedtime.  Patient was open to trying this though did mention some concern about the Topamax moving to bedtime as her migraines have improved markedly since switching it to daytime. Past Psychiatric History: Patient has seen Dr. Modesta Messing in the  past Psychiatry admission: denies  Previous suicide attempt: denies Past trials of medication: citalopram, and Effexor.  Patient has also taken Topamax for migraines and pregabalin for small fiber neuropathy.  Past Medical History:  Past Medical History:  Diagnosis Date   Adenomatous colon polyp    Anxiety    PHOBIAS   Arthritis    Breast cancer ([place]) 08/08/13   right LOQ   Cataract    Chronic insomnia    Cluster headaches    history of migraines / NONE FOR SEVERAL YRS    Depression    Diverticulosis    Fatty liver 2011   Fibromyalgia    GERD (gastroesophageal reflux disease)    H/O hiatal hernia    History of transfusion 08/30/2013   Hx of radiation therapy 10/29/13- 12/14/13   right chest wall 5040 cGy 28 sessions, right supraclavicular/axillary region 5040 cGy 28 sessions, right chest wall boost 1000 cGy 5 sessions   Hypothyroidism    Internal hemorrhoids    Irritable bowel syndrome    Kidney stone    Lymphedema    RT ARM - WEARS SLEEVE   Macular degeneration    hole/right eye   MDD (major depressive disorder)    Osteopenia    Other abnormal glucose    Other and unspecified hyperlipidemia    Pain in joint, shoulder region    Pneumonia 6712,4580   Sleep apnea    USES C-PAP   Stress incontinence, female     Past Surgical History:  Procedure Laterality Date   ABDOMINAL HYSTERECTOMY     APPENDECTOMY     BILATERAL TOTAL MASTECTOMY WITH AXILLARY LYMPH NODE DISSECTION  08/30/2013   Dr Barry Dienes   BREAST CYST ASPIRATION     9 cysts   CATARACT EXTRACTION, BILATERAL  2005/2007   CHOLECYSTECTOMY     COLONOSCOPY     CYSTOCELE REPAIR     EVACUATION BREAST HEMATOMA Left 08/31/2013   Procedure: EVACUATION HEMATOMA BREAST;  Surgeon: Stark Klein, MD;  Location: [facility];  Service: General;  Laterality: Left;   EYE SURGERY     to repair macular hole   FOOT ARTHROPLASTY     lt    GANGLION CYST EXCISION     rt foot   HEMORRHOID SURGERY     03/1993   JOINT REPLACEMENT  03/15/11   left knee replacement   KNEE ARTHROSCOPY     /partial knee 2016/left knee 2012   MASS EXCISION  11/04/2011   Procedure: EXCISION MASS;  Surgeon: Cammie Sickle., MD;  Location: [place];  Service: Orthopedics;  Laterality: Right;  excisional biopsy right ulna mass   MASTECTOMY W/ SENTINEL NODE BIOPSY Right 08/30/2013   Procedure: RIGHT  AXILLARY SENTINEL LYMPH NODE BIOPSY; Right Axillary Node Disection;  Surgeon: Stark Klein, MD;  Location: [facility];   Service: General;  Laterality: Right;  Right side nuc med 7:00    PARTIAL KNEE ARTHROPLASTY Right 11/03/2015   Procedure: RIGHT KNEE MEDIAL UNICOMPARTMENTAL ARTHROPLASTY ;  Surgeon: Gaynelle Arabian, MD;  Location: [facility];  Service: Orthopedics;  Laterality: Right;   RECTOCELE REPAIR     SIMPLE MASTECTOMY WITH AXILLARY SENTINEL NODE BIOPSY Left 08/30/2013   Procedure: Bilateral Breast Mastectomy ;  Surgeon: Stark Klein, MD;  Location: [place];  Service: General;  Laterality: Left;   skin tags removed     breast, panty line, neckline   TOE SURGERY     preventative crossover toe surg/right foot   TOE SURGERY  2009   left foot/screw  in 2nd toe   TONSILLECTOMY     UPPER GASTROINTESTINAL ENDOSCOPY      Family Psychiatric History: Denies  Family History:  Family History  Problem Relation Age of Onset   Stroke Mother        died age 56   Diabetes Mother    Breast cancer Mother 64   Breast cancer Sister 59   Breast cancer Paternal Aunt 14   Diabetes Maternal Grandfather    Breast cancer Paternal Grandmother 76   Breast cancer Paternal Aunt        dx in her 73s   Cancer Maternal Grandmother        intra-abdominal cancer   Brain cancer Maternal Uncle 7   Brain cancer Cousin 78       maternal cousin   Brain cancer Cousin 39       paternal cousin   Colon cancer Neg Hx     Social History:  Social History   Socioeconomic History   Marital status: Married    Spouse name: Fritz Pickerel   Number of children: 3   Years of education: Not on file   Highest education level: Not on file  Occupational History   Occupation: retired bookkeeper  Tobacco Use   Smoking status: Former    Packs/day: 0.10    Years: 2.00    Total pack years: 0.20    Types: Cigarettes    Start date: 11/16/1959    Quit date: 11/15/1960    Years since quitting: 61.9    Passive exposure: Past (over 26 years ago)   Smokeless tobacco: Never  Vaping Use   Vaping Use: Never used  Substance and Sexual Activity   Alcohol  use: Yes    Comment: rarely   Drug use: No   Sexual activity: Yes    Comment: menarche age 53, fist live birth 63, P 3, hysterectomy age 88, no HRT, BCP 2 yrs  Other Topics Concern   Not on file  Social History Narrative   Occupation:  Retired Radiation protection practitioner    Married with 3 grown children      Never Smoked     Alcohol use-no        Social Determinants of Health   Financial Resource Strain: Low Risk  (09/15/2021)   Overall Financial Resource Strain (CARDIA)    Difficulty of Paying Living Expenses: Not hard at all  Food Insecurity: No Food Insecurity (09/15/2021)   Hunger Vital Sign    Worried About Running Out of Food in the Last Year: Never true    Ran Out of Food in the Last Year: Never true  Transportation Needs: No Transportation Needs (09/15/2021)   PRAPARE - Hydrologist (Medical): No    Lack of Transportation (Non-Medical): No  Physical Activity: Unknown (09/15/2021)   Exercise Vital Sign    Days of Exercise per Week: 0 days    Minutes of Exercise per Session: Not on file  Recent Concern: Physical Activity - Inactive (09/15/2021)   Exercise Vital Sign    Days of Exercise per Week: 0 days    Minutes of Exercise per Session: 30 min  Stress: Stress Concern Present (09/15/2021)   [place]    Feeling of Stress : To some extent  Social Connections: Socially Integrated (09/15/2021)   Social Connection and Isolation Panel [NHANES]    Frequency of Communication with Friends and Family: More  than three times a week    Frequency of Social Gatherings with Friends and Family: Twice a week    Attends Religious Services: More than 4 times per year    Active Member of [employer] or Organizations: Yes    Attends Music therapist: More than 4 times per year    Marital Status: Married    Allergies: No Known Allergies  Current Medications: Current Outpatient Medications  Medication Sig  Dispense Refill   Bacillus Coagulans-Inulin (PROBIOTIC) 1-250 BILLION-MG CAPS Take by mouth.     butalbital-acetaminophen-caffeine (FIORICET) 50-325-40 MG tablet Take 1 tablet by mouth as needed for headache. 30 tablet 2   Calcium Carbonate-Vitamin D (CALCIUM-VITAMIN D3 PO) Take 1 tablet by mouth in the morning and at bedtime.     Cholecalciferol (VITAMIN D3) 50 MCG (2000 UT) TABS Take 2 tablets by mouth daily.     diazepam (VALIUM) 5 MG tablet Take 5 mg by mouth every 6 (six) hours as needed for anxiety. As needed (flying & dental)     DULoxetine (CYMBALTA) 60 MG capsule Take 1 capsule (60 mg total) by mouth daily. 30 capsule 1   esomeprazole (NEXIUM) 40 MG capsule Take 1 capsule (40 mg total) by mouth daily at 12 noon. (Patient taking differently: Take 40 mg by mouth daily before breakfast.) 365 capsule 0   estradiol (ESTRACE) 0.1 MG/GM vaginal cream Place 0.5 g vaginally 2 (two) times a week. Place 0.5g nightly for two weeks then twice a week after 30 g 11   famotidine (PEPCID) 40 MG tablet TAKE ONE TABLET BY MOUTH EVERY NIGHT AT BEDTIME 90 tablet 1   levothyroxine (SYNTHROID) 100 MCG tablet TAKE ONE TABLET BY MOUTH DAILY 365 tablet 0   MELATONIN PO Take 1 tablet by mouth daily.     mirabegron ER (MYRBETRIQ) 50 MG TB24 tablet Take 1 tablet (50 mg total) by mouth daily. 90 tablet 3   montelukast (SINGULAIR) 10 MG tablet Take 1 tablet (10 mg total) by mouth at bedtime. 90 tablet 1   nitrofurantoin, macrocrystal-monohydrate, (MACROBID) 100 MG capsule Take 1 capsule (100 mg total) by mouth at bedtime. 365 capsule 0   omega-3 acid ethyl esters (LOVAZA) 1 g capsule TAKE ONE CAPSULE BY MOUTH TWICE A DAY 180 capsule 3   Polyethylene Glycol 3350 (MIRALAX PO) Take by mouth. 1/2 capful every morning     pregabalin (LYRICA) 150 MG capsule Take 1 capsule (150 mg total) by mouth 2 (two) times daily. 180 capsule 0   rosuvastatin (CRESTOR) 20 MG tablet Take 1 tablet (20 mg total) by mouth daily. 365 tablet 0    topiramate (TOPAMAX) 25 MG tablet TAKE ONE TABLET BY MOUTH DAILY 325 tablet 0   No current facility-administered medications for this visit.     Musculoskeletal: Strength & Muscle Tone: within normal limits Gait & Station: normal, unsteady Patient leans: N/A  Psychiatric Specialty Exam: Review of Systems  There were no vitals taken for this visit.There is no height or weight on file to calculate BMI.  General Appearance: Well Groomed  Eye Contact:  Good  Speech:  Clear and Coherent and Normal Rate  Volume:  Normal  Mood:  Anxious and Euthymic  Affect:  Congruent  Thought Process:  Coherent and Goal Directed  Orientation:  Full (Time, Place, and Person)  Thought Content: Logical   Suicidal Thoughts:  No  Homicidal Thoughts:  No  Memory:  NA  Judgement:  Good  Insight:  Good  Psychomotor Activity:  Normal  Concentration:  Concentration: Good  Recall:  [place] of Knowledge: Fair  Language: Good  Akathisia:  NA    AIMS (if indicated): not done  Assets:  Communication Skills Desire for Improvement Housing Intimacy Leisure Time Resilience Social Support  ADL's:  Intact  Cognition: WNL  Sleep:  Good   Metabolic Disorder Labs: Lab Results  Component Value Date   HGBA1C 6.2 05/25/2022   MPG 126 (H) 08/27/2013   MPG 117 (H) 06/22/2013   Lab Results  Component Value Date   PROLACTIN 3.9 07/22/2009   Lab Results  Component Value Date   CHOL 202 (H) 05/25/2022   TRIG 80.0 05/25/2022   HDL 52.80 05/25/2022   CHOLHDL 4 05/25/2022   VLDL 16.0 05/25/2022   LDLCALC 133 (H) 05/25/2022   LDLCALC 138 (H) 03/06/2021   Lab Results  Component Value Date   TSH 1.89 05/25/2022   TSH 0.62 03/03/2022    Therapeutic Level Labs: No results found for: "LITHIUM" No results found for: "VALPROATE" No results found for: "CBMZ"   Screenings: PHQ2-9    [place] Office Visit from 08/11/2022 in [facility] Visit from  06/30/2022 in [place] [facility] from 03/18/2022 in [facility] from 12/03/2021 in [place] [facility] [place] from 07/22/2021 in [place] at [place]  PHQ-2 Total Score 0 3 0 0 0  PHQ-9 Total Score -- 11 0 0 --      [place] Visit from 08/11/2022 in [facility] from 08/08/2022 in [facility] ED from 03/15/2022 in [facility]  C-SSRS RISK CATEGORY No Risk No Risk No Risk       Collaboration of Care: Collaboration of Care: Medication Management AEB medication prescription  Patient/Guardian was advised Release of Information must be obtained prior to any record release in order to collaborate their care with an outside provider. Patient/Guardian was advised if they have not already done so to contact the registration department to sign all necessary forms in order for [name] to release information regarding their care.   Consent: Patient/Guardian gives verbal consent for treatment and assignment of benefits for services provided during this visit. Patient/Guardian expressed understanding and agreed to proceed.    Vista Mink, MD 10/11/2022, 1:58 PM

## 2022-10-16 ENCOUNTER — Other Ambulatory Visit: Payer: Self-pay | Admitting: Physician Assistant

## 2022-10-27 ENCOUNTER — Ambulatory Visit (INDEPENDENT_AMBULATORY_CARE_PROVIDER_SITE_OTHER): Payer: Medicare HMO | Admitting: Adult Health

## 2022-10-27 ENCOUNTER — Encounter: Payer: Self-pay | Admitting: Adult Health

## 2022-10-27 VITALS — BP 102/80 | HR 70 | Temp 97.8°F | Ht 59.0 in | Wt 167.0 lb

## 2022-10-27 DIAGNOSIS — R4189 Other symptoms and signs involving cognitive functions and awareness: Secondary | ICD-10-CM | POA: Diagnosis not present

## 2022-10-27 DIAGNOSIS — S90411A Abrasion, right great toe, initial encounter: Secondary | ICD-10-CM

## 2022-10-27 NOTE — Progress Notes (Signed)
Subjective:    [name], female    DOB: [birth year], 81 y.o.   MRN: [id number]  HPI 81 year old female who  has a past medical history of Adenomatous colon polyp, Anxiety, Arthritis, Breast cancer ([place]) (08/08/13), Cataract, Chronic insomnia, Cluster headaches, Depression, Diverticulosis, Fatty liver (2011), Fibromyalgia, GERD (gastroesophageal reflux disease), H/O hiatal hernia, History of transfusion (08/30/2013), radiation therapy (10/29/13- 12/14/13), Hypothyroidism, Internal hemorrhoids, Irritable bowel syndrome, Kidney stone, Lymphedema, Macular degeneration, MDD (major depressive disorder), Osteopenia, Other abnormal glucose, Other and unspecified hyperlipidemia, Pain in joint, shoulder region, Pneumonia ([phone]), Sleep apnea, and Stress incontinence, female.  She is being evaluated today for multiple issues.  First issue is that of concern for cellulitis of her right great toe.  She was treated for cellulitis back in September 2023 and with the antibiotics it resolved.  She has noticed some mild redness around the nailbed.  Only has pain with walking.  Her second issue is concern for memory loss.  Her husband who is with her today does report short-term memory loss.  Examples that can be given are not remembering conversations or tasks to get done.  He states "I will give her a pen and then a couple seconds later show ask me to give her a pen so she can write down something or she will open the door when I just asked her not to open the door." This is frustrating for both she and her husband.    She does take B12 and Vitamin D daily. Her last TSH was WNL  Lab Results  Component Value Date   TSH 1.89 05/25/2022      Review of Systems See HPI   Past Medical History:  Diagnosis Date   Adenomatous colon polyp    Anxiety    PHOBIAS   Arthritis    Breast cancer ([place]) 08/08/13   right LOQ   Cataract    Chronic insomnia    Cluster headaches    history of  migraines / NONE FOR SEVERAL YRS   Depression    Diverticulosis    Fatty liver 2011   Fibromyalgia    GERD (gastroesophageal reflux disease)    H/O hiatal hernia    History of transfusion 08/30/2013   Hx of radiation therapy 10/29/13- 12/14/13   right chest wall 5040 cGy 28 sessions, right supraclavicular/axillary region 5040 cGy 28 sessions, right chest wall boost 1000 cGy 5 sessions   Hypothyroidism    Internal hemorrhoids    Irritable bowel syndrome    Kidney stone    Lymphedema    RT ARM - WEARS SLEEVE   Macular degeneration    hole/right eye   MDD (major depressive disorder)    Osteopenia    Other abnormal glucose    Other and unspecified hyperlipidemia    Pain in joint, shoulder region    Pneumonia 4128,7867   Sleep apnea    USES C-PAP   Stress incontinence, female     Social History   Socioeconomic History   Marital status: Married    Spouse name: Fritz Pickerel   Number of children: 3   Years of education: Not on file   Highest education level: GED or equivalent  Occupational History   Occupation: retired bookkeeper  Tobacco Use   Smoking status: Former    Packs/day: 0.10    Years: 2.00    Total pack years: 0.20    Types: Cigarettes    Start date: 11/16/1959    Quit date:  11/15/1960    Years since quitting: 61.9    Passive exposure: Past (over 78 years ago)   Smokeless tobacco: Never  Vaping Use   Vaping Use: Never used  Substance and Sexual Activity   Alcohol use: Yes    Comment: rarely   Drug use: No   Sexual activity: Yes    Comment: menarche age 18, fist live birth 37, P 3, hysterectomy age 84, no HRT, BCP 2 yrs  Other Topics Concern   Not on file  Social History Narrative   Occupation:  Retired Radiation protection practitioner    Married with 3 grown children      Never Smoked     Alcohol use-no        Social Determinants of Radio broadcast assistant Strain: Low Risk  (10/24/2022)   Overall Financial Resource Strain (CARDIA)    Difficulty of Paying Living Expenses:  Not hard at all  Food Insecurity: No Food Insecurity (10/24/2022)   Hunger Vital Sign    Worried About Running Out of Food in the Last Year: Never true    Ran Out of Food in the Last Year: Never true  Transportation Needs: No Transportation Needs (10/24/2022)   PRAPARE - Hydrologist (Medical): No    Lack of Transportation (Non-Medical): No  Physical Activity: Unknown (10/24/2022)   Exercise Vital Sign    Days of Exercise per Week: 0 days    Minutes of Exercise per Session: Not on file  Stress: Stress Concern Present (10/24/2022)   [place]    Feeling of Stress : Rather much  Social Connections: Unknown (10/24/2022)   Social Connection and Isolation Panel [NHANES]    Frequency of Communication with Friends and Family: More than three times a week    Frequency of Social Gatherings with Friends and Family: Twice a week    Attends Religious Services: Not on file    Active Member of Clubs or Organizations: Yes    Attends Archivist Meetings: 1 to 4 times per year    Marital Status: Married  Human resources officer Violence: Not At Risk (07/22/2021)   Humiliation, Afraid, Rape, and Kick questionnaire    Fear of Current or Ex-Partner: No    Emotionally Abused: No    Physically Abused: No    Sexually Abused: No    Past Surgical History:  Procedure Laterality Date   ABDOMINAL HYSTERECTOMY     APPENDECTOMY     BILATERAL TOTAL MASTECTOMY WITH AXILLARY LYMPH NODE DISSECTION  08/30/2013   Dr Barry Dienes   BREAST CYST ASPIRATION     9 cysts   CATARACT EXTRACTION, BILATERAL  2005/2007   CHOLECYSTECTOMY     COLONOSCOPY     CYSTOCELE REPAIR     EVACUATION BREAST HEMATOMA Left 08/31/2013   Procedure: EVACUATION HEMATOMA BREAST;  Surgeon: Stark Klein, MD;  Location: [facility];  Service: General;  Laterality: Left;   EYE SURGERY     to repair macular hole   FOOT ARTHROPLASTY     lt    GANGLION CYST  EXCISION     rt foot   HEMORRHOID SURGERY     03/1993   JOINT REPLACEMENT  03/15/11   left knee replacement   KNEE ARTHROSCOPY     /partial knee 2016/left knee 2012   MASS EXCISION  11/04/2011   Procedure: EXCISION MASS;  Surgeon: Cammie Sickle., MD;  Location: [facility];  Service:  Orthopedics;  Laterality: Right;  excisional biopsy right ulna mass   MASTECTOMY W/ SENTINEL NODE BIOPSY Right 08/30/2013   Procedure: RIGHT  AXILLARY SENTINEL LYMPH NODE BIOPSY; Right Axillary Node Disection;  Surgeon: Stark Klein, MD;  Location: [facility];  Service: General;  Laterality: Right;  Right side nuc med 7:00    PARTIAL KNEE ARTHROPLASTY Right 11/03/2015   Procedure: RIGHT KNEE MEDIAL UNICOMPARTMENTAL ARTHROPLASTY ;  Surgeon: Gaynelle Arabian, MD;  Location: [facility];  Service: Orthopedics;  Laterality: Right;   RECTOCELE REPAIR     SIMPLE MASTECTOMY WITH AXILLARY SENTINEL NODE BIOPSY Left 08/30/2013   Procedure: Bilateral Breast Mastectomy ;  Surgeon: Stark Klein, MD;  Location: [facility];  Service: General;  Laterality: Left;   skin tags removed     breast, panty line, neckline   TOE SURGERY     preventative crossover toe surg/right foot   TOE SURGERY  2009   left foot/screw  in 2nd toe   TONSILLECTOMY     UPPER GASTROINTESTINAL ENDOSCOPY      Family History  Problem Relation Age of Onset   Stroke Mother        died age 58   Diabetes Mother    Breast cancer Mother 59   Breast cancer Sister 70   Breast cancer Paternal Aunt 67   Diabetes Maternal Grandfather    Breast cancer Paternal Grandmother 30   Breast cancer Paternal Aunt        dx in her 53s   Cancer Maternal Grandmother        intra-abdominal cancer   Brain cancer Maternal Uncle 8   Brain cancer Cousin 42       maternal cousin   Brain cancer Cousin 21       paternal cousin   Colon cancer Neg Hx     No Known Allergies  Current Outpatient Medications on File Prior to Visit  Medication Sig Dispense Refill    Bacillus Coagulans-Inulin (PROBIOTIC) 1-250 BILLION-MG CAPS Take by mouth.     butalbital-acetaminophen-caffeine (FIORICET) 50-325-40 MG tablet Take 1 tablet by mouth as needed for headache. 30 tablet 2   Calcium Carbonate-Vitamin D (CALCIUM-VITAMIN D3 PO) Take 1 tablet by mouth in the morning and at bedtime.     Cholecalciferol (VITAMIN D3) 50 MCG (2000 UT) TABS Take 2 tablets by mouth daily.     diazepam (VALIUM) 5 MG tablet Take 5 mg by mouth every 6 (six) hours as needed for anxiety. As needed (flying & dental)     DULoxetine (CYMBALTA) 60 MG capsule Take 1 capsule (60 mg total) by mouth daily. 90 capsule 1   esomeprazole (NEXIUM) 40 MG capsule Take 1 capsule (40 mg total) by mouth daily at 12 noon. (Patient taking differently: Take 40 mg by mouth daily before breakfast.) 365 capsule 0   estradiol (ESTRACE) 0.1 MG/GM vaginal cream Place 0.5 g vaginally 2 (two) times a week. Place 0.5g nightly for two weeks then twice a week after 30 g 11   famotidine (PEPCID) 40 MG tablet TAKE ONE TABLET BY MOUTH EVERY NIGHT AT BEDTIME 90 tablet 0   levothyroxine (SYNTHROID) 100 MCG tablet TAKE ONE TABLET BY MOUTH DAILY 365 tablet 0   MELATONIN PO Take 1 tablet by mouth daily.     mirabegron ER (MYRBETRIQ) 50 MG TB24 tablet Take 1 tablet (50 mg total) by mouth daily. 90 tablet 3   montelukast (SINGULAIR) 10 MG tablet Take 1 tablet (10 mg total) by mouth at bedtime. [place]  tablet 1   nitrofurantoin, macrocrystal-monohydrate, (MACROBID) 100 MG capsule Take 1 capsule (100 mg total) by mouth at bedtime. 365 capsule 0   omega-3 acid ethyl esters (LOVAZA) 1 g capsule TAKE ONE CAPSULE BY MOUTH TWICE A DAY 180 capsule 3   Polyethylene Glycol 3350 (MIRALAX PO) Take by mouth. 1/2 capful every morning     pregabalin (LYRICA) 150 MG capsule Take 1 capsule (150 mg total) by mouth 2 (two) times daily. 180 capsule 0   rosuvastatin (CRESTOR) 20 MG tablet Take 1 tablet (20 mg total) by mouth daily. 365 tablet 0   topiramate  (TOPAMAX) 25 MG tablet TAKE ONE TABLET BY MOUTH DAILY 325 tablet 0   No current facility-administered medications on file prior to visit.    BP 102/80   Pulse 70   Temp 97.8 F (36.6 C) (Oral)   Ht '4\' 11"'$  (1.499 m)   Wt 167 lb (75.8 kg)   SpO2 94%   BMI 33.73 kg/m       Objective:   Physical Exam Vitals and nursing note reviewed.  Constitutional:      Appearance: Normal appearance.  Cardiovascular:     Rate and Rhythm: Normal rate and regular rhythm.     Pulses: Normal pulses.     Heart sounds: Normal heart sounds.  Pulmonary:     Effort: Pulmonary effort is normal.     Breath sounds: Normal breath sounds.  Musculoskeletal:        General: Normal range of motion.  Feet:     Comments: Trace redness around the nail bed of her right great toe. Does not appear as cellulitis; instead looks like redness from pressure of her shoe.  Skin:    General: Skin is warm and dry.  Neurological:     General: No focal deficit present.     Mental Status: She is alert and oriented to person, place, and time.  Psychiatric:        Mood and Affect: Mood normal.        Behavior: Behavior normal.        Thought Content: Thought content normal.        Judgment: Judgment normal.           Assessment & Plan:  1. Abrasion of right great toe, initial encounter - needs looser fitting shoes  2. Cognitive impairment    10/27/2022    1:49 PM  MMSE - Mini Mental State Exam  Orientation to time 5  Orientation to Place 5  Registration 3  Attention/ Calculation 5  Recall 2  Language- name 2 objects 2  Language- repeat 1  Language- follow 3 step command 3  Language- read & follow direction 1  Write a sentence 1  Copy design 1  Total score 29  - No signs of cognitive impairment. Likely age related. Does not want to do MRI at this time, which I am in agreement with   [name], NP  Time spent with patient today was 32 minutes which consisted of chart review, discussing  diagnosis, work up, treatment answering questions and documentation.

## 2022-10-29 ENCOUNTER — Encounter: Payer: Self-pay | Admitting: Adult Health

## 2022-11-01 ENCOUNTER — Encounter: Payer: Self-pay | Admitting: Family Medicine

## 2022-11-01 ENCOUNTER — Telehealth (INDEPENDENT_AMBULATORY_CARE_PROVIDER_SITE_OTHER): Payer: Medicare HMO | Admitting: Family Medicine

## 2022-11-01 VITALS — Ht 59.0 in | Wt 167.0 lb

## 2022-11-01 DIAGNOSIS — R059 Cough, unspecified: Secondary | ICD-10-CM | POA: Diagnosis not present

## 2022-11-01 DIAGNOSIS — J101 Influenza due to other identified influenza virus with other respiratory manifestations: Secondary | ICD-10-CM | POA: Diagnosis not present

## 2022-11-01 LAB — POCT INFLUENZA A/B
Influenza A, POC: POSITIVE — AB
Influenza B, POC: NEGATIVE

## 2022-11-01 LAB — POC COVID19 BINAXNOW: SARS Coronavirus 2 Ag: NEGATIVE

## 2022-11-01 MED ORDER — HYDROCODONE BIT-HOMATROP MBR 5-1.5 MG/5ML PO SOLN: 5.0000 mL | Freq: Four times a day (QID) | ORAL | 0 refills | Status: DC | PRN

## 2022-11-01 NOTE — Progress Notes (Signed)
[name], female   DOB: 14-May-1941, 81 y.o.   MRN: [id number]   Virtual Visit via Video Note  I connected with [name] on 11/01/22 at  2:15 PM EST by a video enabled telemedicine application and verified that I am speaking with the correct person using two identifiers.  Location patient: home Location provider:work or home office Persons participating in the virtual visit: patient, provider  I discussed the limitations of evaluation and management by telemedicine and the availability of in person appointments. The patient expressed understanding and agreed to proceed.   HPI:  Ms. Balthazar has respiratory illness.  She states that about 4 days ago she developed some cough and fatigue.  Cough mostly dry.  She states Saturday she had low-grade fever but none since then.  No nausea or vomiting.  Cough severe at times.  Not relieved with over-the-counter medications.  She had some leftover Hycodan which has helped and she is requesting refill.  Her husband does not have any symptoms thus far.  Patient denies any significant dyspnea.  They do not have home pulse oximeter.  She is a little bit of chest wall tenderness ever since her mastectomy years ago and perhaps exacerbated by her coughing.  No pleuritic pain.   ROS: See pertinent positives and negatives per HPI.  Past Medical History:  Diagnosis Date   Adenomatous colon polyp    Anxiety    PHOBIAS   Arthritis    Breast cancer ([facility]) 08/08/13   right LOQ   Cataract    Chronic insomnia    Cluster headaches    history of migraines / NONE FOR SEVERAL YRS   Depression    Diverticulosis    Fatty liver 2011   Fibromyalgia    GERD (gastroesophageal reflux disease)    H/O hiatal hernia    History of transfusion 08/30/2013   Hx of radiation therapy 10/29/13- 12/14/13   right chest wall 5040 cGy 28 sessions, right supraclavicular/axillary region 5040 cGy 28 sessions, right chest wall boost 1000 cGy 5 sessions    Hypothyroidism    Internal hemorrhoids    Irritable bowel syndrome    Kidney stone    Lymphedema    RT ARM - WEARS SLEEVE   Macular degeneration    hole/right eye   MDD (major depressive disorder)    Osteopenia    Other abnormal glucose    Other and unspecified hyperlipidemia    Pain in joint, shoulder region    Pneumonia 4034,7425   Sleep apnea    USES C-PAP   Stress incontinence, female     Past Surgical History:  Procedure Laterality Date   ABDOMINAL HYSTERECTOMY     APPENDECTOMY     BILATERAL TOTAL MASTECTOMY WITH AXILLARY LYMPH NODE DISSECTION  08/30/2013   Dr Barry Dienes   BREAST CYST ASPIRATION     9 cysts   CATARACT EXTRACTION, BILATERAL  2005/2007   CHOLECYSTECTOMY     COLONOSCOPY     CYSTOCELE REPAIR     EVACUATION BREAST HEMATOMA Left 08/31/2013   Procedure: EVACUATION HEMATOMA BREAST;  Surgeon: Stark Klein, MD;  Location: [facility];  Service: General;  Laterality: Left;   EYE SURGERY     to repair macular hole   FOOT ARTHROPLASTY     lt    GANGLION CYST EXCISION     rt foot   HEMORRHOID SURGERY     03/1993   JOINT REPLACEMENT  03/15/11   left knee replacement   KNEE ARTHROSCOPY     /  partial knee 2016/left knee 2012   MASS EXCISION  11/04/2011   Procedure: EXCISION MASS;  Surgeon: Cammie Sickle., MD;  Location: [facility];  Service: Orthopedics;  Laterality: Right;  excisional biopsy right ulna mass   MASTECTOMY W/ SENTINEL NODE BIOPSY Right 08/30/2013   Procedure: RIGHT  AXILLARY SENTINEL LYMPH NODE BIOPSY; Right Axillary Node Disection;  Surgeon: Stark Klein, MD;  Location: [facility];  Service: General;  Laterality: Right;  Right side nuc med 7:00    PARTIAL KNEE ARTHROPLASTY Right 11/03/2015   Procedure: RIGHT KNEE MEDIAL UNICOMPARTMENTAL ARTHROPLASTY ;  Surgeon: Gaynelle Arabian, MD;  [facility];  Service: Orthopedics;  Laterality: Right;   RECTOCELE REPAIR     SIMPLE MASTECTOMY WITH AXILLARY SENTINEL NODE BIOPSY Left 08/30/2013    Procedure: Bilateral Breast Mastectomy ;  Surgeon: Stark Klein, MD;  Location: [facility];  Service: General;  Laterality: Left;   skin tags removed     breast, panty line, neckline   TOE SURGERY     preventative crossover toe surg/right foot   TOE SURGERY  2009   left foot/screw  in 2nd toe   TONSILLECTOMY     UPPER GASTROINTESTINAL ENDOSCOPY      Family History  Problem Relation Age of Onset   Stroke Mother        died age 35   Diabetes Mother    Breast cancer Mother 61   Breast cancer Sister 74   Breast cancer Paternal Aunt 73   Diabetes Maternal Grandfather    Breast cancer Paternal Grandmother 62   Breast cancer Paternal Aunt        dx in her 68s   Cancer Maternal Grandmother        intra-abdominal cancer   Brain cancer Maternal Uncle 91   Brain cancer Cousin 85       maternal cousin   Brain cancer Cousin 40       paternal cousin   Colon cancer Neg Hx     SOCIAL HX: Non-smoker   Current Outpatient Medications:    Bacillus Coagulans-Inulin (PROBIOTIC) 1-250 BILLION-MG CAPS, Take by mouth., Disp: , Rfl:    butalbital-acetaminophen-caffeine (FIORICET) 50-325-40 MG tablet, Take 1 tablet by mouth as needed for headache., Disp: 30 tablet, Rfl: 2   Calcium Carbonate-Vitamin D (CALCIUM-VITAMIN D3 PO), Take 1 tablet by mouth in the morning and at bedtime., Disp: , Rfl:    Cholecalciferol (VITAMIN D3) 50 MCG (2000 UT) TABS, Take 2 tablets by mouth daily., Disp: , Rfl:    diazepam (VALIUM) 5 MG tablet, Take 5 mg by mouth every 6 (six) hours as needed for anxiety. As needed (flying & dental), Disp: , Rfl:    DULoxetine (CYMBALTA) 60 MG capsule, Take 1 capsule (60 mg total) by mouth daily., Disp: 90 capsule, Rfl: 1   esomeprazole (NEXIUM) 40 MG capsule, Take 1 capsule (40 mg total) by mouth daily at 12 noon. (Patient taking differently: Take 40 mg by mouth daily before breakfast.), Disp: 365 capsule, Rfl: 0   estradiol (ESTRACE) 0.1 MG/GM vaginal cream, Place 0.5 g vaginally 2 (two)  times a week. Place 0.5g nightly for two weeks then twice a week after, Disp: 30 g, Rfl: 11   famotidine (PEPCID) 40 MG tablet, TAKE ONE TABLET BY MOUTH EVERY NIGHT AT BEDTIME, Disp: 90 tablet, Rfl: 0   levothyroxine (SYNTHROID) 100 MCG tablet, TAKE ONE TABLET BY MOUTH DAILY, Disp: 365 tablet, Rfl: 0   MELATONIN PO, Take 1 tablet  by mouth daily., Disp: , Rfl:    mirabegron ER (MYRBETRIQ) 50 MG TB24 tablet, Take 1 tablet (50 mg total) by mouth daily., Disp: 90 tablet, Rfl: 3   montelukast (SINGULAIR) 10 MG tablet, Take 1 tablet (10 mg total) by mouth at bedtime., Disp: 90 tablet, Rfl: 1   nitrofurantoin, macrocrystal-monohydrate, (MACROBID) 100 MG capsule, Take 1 capsule (100 mg total) by mouth at bedtime., Disp: 365 capsule, Rfl: 0   omega-3 acid ethyl esters (LOVAZA) 1 g capsule, TAKE ONE CAPSULE BY MOUTH TWICE A DAY, Disp: 180 capsule, Rfl: 3   Polyethylene Glycol 3350 (MIRALAX PO), Take by mouth. 1/2 capful every morning, Disp: , Rfl:    pregabalin (LYRICA) 150 MG capsule, Take 1 capsule (150 mg total) by mouth 2 (two) times daily., Disp: 180 capsule, Rfl: 0   rosuvastatin (CRESTOR) 20 MG tablet, Take 1 tablet (20 mg total) by mouth daily., Disp: 365 tablet, Rfl: 0   topiramate (TOPAMAX) 25 MG tablet, TAKE ONE TABLET BY MOUTH DAILY, Disp: 325 tablet, Rfl: 0  EXAM:  VITALS per patient if applicable:  GENERAL: alert, oriented, appears well and in no acute distress  HEENT: atraumatic, conjunttiva clear, no obvious abnormalities on inspection of external nose and ears  NECK: normal movements of the head and neck  LUNGS: on inspection no signs of respiratory distress, breathing rate appears normal, no obvious gross SOB, gasping or wheezing  CV: no obvious cyanosis  MS: moves all visible extremities without noticeable abnormality  PSYCH/NEURO: pleasant and cooperative, no obvious depression or anxiety, speech and thought processing grossly intact  ASSESSMENT AND PLAN:  Discussed the  following assessment and plan:  Cough, unspecified type - Plan: POC COVID-19, POC Influenza A/B    -Patient has 4-day history of cough.  Influenza A positive and COVID-negative. -We discussed Tamiflu but since she is past 48-hour window would not likely be of any benefit and she is almost 96 hours into this illness -Recommend pulse oximeter and monitor O2 sats and be in touch if 90% or less -We did agree to send in refill Hycodan cough syrup 1 teaspoon every 6 hours as needed for severe cough -Follow-up immediately for any fever or increased shortness of breath     I discussed the assessment and treatment plan with the patient. The patient was provided an opportunity to ask questions and all were answered. The patient agreed with the plan and demonstrated an understanding of the instructions.   The patient was advised to call back or seek an in-person evaluation if the symptoms worsen or if the condition fails to improve as anticipated.     Carolann Littler, MD

## 2022-11-02 ENCOUNTER — Encounter: Payer: Self-pay | Admitting: Family Medicine

## 2022-11-03 ENCOUNTER — Other Ambulatory Visit: Payer: Self-pay | Admitting: Adult Health

## 2022-11-03 DIAGNOSIS — R052 Subacute cough: Secondary | ICD-10-CM

## 2022-11-03 NOTE — Telephone Encounter (Signed)
FYI

## 2022-11-03 NOTE — Telephone Encounter (Signed)
Please advise 

## 2022-11-08 ENCOUNTER — Encounter: Payer: Self-pay | Admitting: Adult Health

## 2022-11-10 ENCOUNTER — Encounter: Payer: Self-pay | Admitting: Family Medicine

## 2022-11-10 ENCOUNTER — Ambulatory Visit (INDEPENDENT_AMBULATORY_CARE_PROVIDER_SITE_OTHER): Payer: Medicare HMO | Admitting: Family Medicine

## 2022-11-10 VITALS — BP 110/70 | HR 91 | Temp 97.9°F | Ht 59.0 in | Wt 175.1 lb

## 2022-11-10 DIAGNOSIS — R052 Subacute cough: Secondary | ICD-10-CM | POA: Diagnosis not present

## 2022-11-10 MED ORDER — HYDROCODONE BIT-HOMATROP MBR 5-1.5 MG/5ML PO SOLN: 5.0000 mL | Freq: Four times a day (QID) | ORAL | 0 refills | Status: DC | PRN

## 2022-11-10 NOTE — Progress Notes (Signed)
Established Patient Office Visit  Subjective   [name], female    DOB: 07/15/1941  Age: 81 y.o. MRN: [id number]  Chief Complaint  Patient presents with   Cough    Patient complains of cough, Productive cough    HPI   Jean Nash is seen with some persistent coughing.  Refer to virtual visit from 12/18-23.  She had onset around 14 December of cough and fatigue.  She had noted some low-grade fever and when she came here to be evaluated ended up with positive influenza screen.  COVID testing was negative.  She did develop occasional low to O2 sats of 89% and low 90s but now consistently upper 90s.  Her cough remains mostly dry.  Overall improved.  Still has episodes of severe coughing at times.  She has taken some Hycodan cough syrup which helps.  She has had previous bilateral mastectomy and states that she has significant pain in the scar tissue with coughing.  No recurrent fever.  Past Medical History:  Diagnosis Date   Adenomatous colon polyp    Anxiety    PHOBIAS   Arthritis    Breast cancer ([place]) 08/08/13   right LOQ   Cataract    Chronic insomnia    Cluster headaches    history of migraines / NONE FOR SEVERAL YRS   Depression    Diverticulosis    Fatty liver 2011   Fibromyalgia    GERD (gastroesophageal reflux disease)    H/O hiatal hernia    History of transfusion 08/30/2013   Hx of radiation therapy 10/29/13- 12/14/13   right chest wall 5040 cGy 28 sessions, right supraclavicular/axillary region 5040 cGy 28 sessions, right chest wall boost 1000 cGy 5 sessions   Hypothyroidism    Internal hemorrhoids    Irritable bowel syndrome    Kidney stone    Lymphedema    RT ARM - WEARS SLEEVE   Macular degeneration    hole/right eye   MDD (major depressive disorder)    Osteopenia    Other abnormal glucose    Other and unspecified hyperlipidemia    Pain in joint, shoulder region    Pneumonia 6578,4696   Sleep apnea    USES C-PAP   Stress incontinence,  female    Past Surgical History:  Procedure Laterality Date   ABDOMINAL HYSTERECTOMY     APPENDECTOMY     BILATERAL TOTAL MASTECTOMY WITH AXILLARY LYMPH NODE DISSECTION  08/30/2013   Dr Barry Dienes   BREAST CYST ASPIRATION     9 cysts   CATARACT EXTRACTION, BILATERAL  2005/2007   CHOLECYSTECTOMY     COLONOSCOPY     CYSTOCELE REPAIR     EVACUATION BREAST HEMATOMA Left 08/31/2013   Procedure: EVACUATION HEMATOMA BREAST;  Surgeon: Stark Klein, MD;  Location: [place];  Service: General;  Laterality: Left;   EYE SURGERY     to repair macular hole   FOOT ARTHROPLASTY     lt    GANGLION CYST EXCISION     rt foot   HEMORRHOID SURGERY     03/1993   JOINT REPLACEMENT  03/15/11   left knee replacement   KNEE ARTHROSCOPY     /partial knee 2016/left knee 2012   MASS EXCISION  11/04/2011   Procedure: EXCISION MASS;  Surgeon: Cammie Sickle., MD;  Location: [place];  Service: Orthopedics;  Laterality: Right;  excisional biopsy right ulna mass   MASTECTOMY W/ SENTINEL NODE BIOPSY Right 08/30/2013  Procedure: RIGHT  AXILLARY SENTINEL LYMPH NODE BIOPSY; Right Axillary Node Disection;  Surgeon: Stark Klein, MD;  Location: [place];  Service: General;  Laterality: Right;  Right side nuc med 7:00    PARTIAL KNEE ARTHROPLASTY Right 11/03/2015   Procedure: RIGHT KNEE MEDIAL UNICOMPARTMENTAL ARTHROPLASTY ;  Surgeon: Gaynelle Arabian, MD;  [facility];  Service: Orthopedics;  Laterality: Right;   RECTOCELE REPAIR     SIMPLE MASTECTOMY WITH AXILLARY SENTINEL NODE BIOPSY Left 08/30/2013   Procedure: Bilateral Breast Mastectomy ;  Surgeon: Stark Klein, MD;  Location: [place];  Service: General;  Laterality: Left;   skin tags removed     breast, panty line, neckline   TOE SURGERY     preventative crossover toe surg/right foot   TOE SURGERY  2009   left foot/screw  in 2nd toe   TONSILLECTOMY     UPPER GASTROINTESTINAL ENDOSCOPY      reports that she quit smoking about 62 years ago. Her  smoking use included cigarettes. She started smoking about 63 years ago. She has a 0.20 pack-year smoking history. She has been exposed to tobacco smoke. She has never used smokeless tobacco. She reports current alcohol use. She reports that she does not use drugs. family history includes Brain cancer (age of onset: 35) in her cousin; Brain cancer (age of onset: 61) in her cousin; Brain cancer (age of onset: 28) in her maternal uncle; Breast cancer in her paternal aunt; Breast cancer (age of onset: 72) in her paternal aunt and paternal grandmother; Breast cancer (age of onset: 38) in her sister; Breast cancer (age of onset: 65) in her mother; Cancer in her maternal grandmother; Diabetes in her maternal grandfather and mother; Stroke in her mother. No Known Allergies  Review of Systems  Constitutional:  Negative for chills and fever.  Respiratory:  Positive for cough. Negative for hemoptysis, shortness of breath and wheezing.   Cardiovascular:  Negative for leg swelling.      Objective:     BP 110/70 (BP Location: Left Arm, Patient Position: Sitting, Cuff Size: Large)   Pulse 91   Temp 97.9 F (36.6 C) (Oral)   Ht '4\' 11"'$  (1.499 m)   Wt 175 lb 1.6 oz (79.4 kg)   SpO2 98%   BMI 35.37 kg/m    Physical Exam Vitals reviewed.  Constitutional:      General: She is not in acute distress.    Appearance: Normal appearance. She is not ill-appearing.  Cardiovascular:     Rate and Rhythm: Normal rate and regular rhythm.  Pulmonary:     Effort: Pulmonary effort is normal. No respiratory distress.     Breath sounds: Normal breath sounds. No wheezing or rales.  Neurological:     Mental Status: She is alert.      No results found for any visits on 11/10/22.    The 10-year ASCVD risk score (Arnett DK, et al., 2019) is: 18.4%    Assessment & Plan:   Recent influenza A infection.  She is overall improving.  No fever.  O2 sats up to 98%.  Unremarkable lung exam at this time.  Still has  some lingering cough.  Feels better overall though.  -They were only able to get 80 mL of Hycodan when they went last week (secondary to pharmacy shortage).  She has severe cough at times which exacerbates her chest wall pain from previous mastectomy.  We refilled Hycodan to take 1 teaspoon every 6-8 hours as needed for  severe cough. -Follow-up immediately for any fever or increased shortness of breath  Carolann Littler, MD

## 2022-11-12 ENCOUNTER — Encounter: Payer: Self-pay | Admitting: Adult Health

## 2022-11-12 MED ORDER — HYDROCODONE BIT-HOMATROP MBR 5-1.5 MG/5ML PO SOLN: 5.0000 mL | Freq: Four times a day (QID) | ORAL | 0 refills | Status: DC | PRN

## 2022-11-12 NOTE — Addendum Note (Signed)
Addended by: Eulas Post on: 11/12/2022 08:09 AM   Modules accepted: Orders

## 2022-11-12 NOTE — Telephone Encounter (Signed)
Rx printed.  Jean Post MD [facility]

## 2022-11-16 ENCOUNTER — Encounter: Payer: Self-pay | Admitting: Adult Health

## 2022-11-16 NOTE — Telephone Encounter (Signed)
Please advise 

## 2022-11-19 ENCOUNTER — Ambulatory Visit (INDEPENDENT_AMBULATORY_CARE_PROVIDER_SITE_OTHER): Payer: Medicare HMO | Admitting: Adult Health

## 2022-11-19 ENCOUNTER — Encounter: Payer: Self-pay | Admitting: Adult Health

## 2022-11-19 VITALS — BP 110/70 | HR 79 | Temp 98.1°F | Ht 59.0 in | Wt 176.0 lb

## 2022-11-19 DIAGNOSIS — R5383 Other fatigue: Secondary | ICD-10-CM | POA: Diagnosis not present

## 2022-11-19 NOTE — Progress Notes (Signed)
Subjective:    [name], female    DOB: [birth year], 82 y.o.   MRN: [id number]  HPI 82 year old female who  has a past medical history of Adenomatous colon polyp, Anxiety, Arthritis, Breast cancer ([place]) (08/08/13), Cataract, Chronic insomnia, Cluster headaches, Depression, Diverticulosis, Fatty liver (2011), Fibromyalgia, GERD (gastroesophageal reflux disease), H/O hiatal hernia, History of transfusion (08/30/2013), radiation therapy (10/29/13- 12/14/13), Hypothyroidism, Internal hemorrhoids, Irritable bowel syndrome, Kidney stone, Lymphedema, Macular degeneration, MDD (major depressive disorder), Osteopenia, Other abnormal glucose, Other and unspecified hyperlipidemia, Pain in joint, shoulder region, Pneumonia (1017,5102), Sleep apnea, and Stress incontinence, female.  She presents to the office today with her husband for generalized fatigue.  This seems to be an ongoing issue, some days are better than others but about midmorning she feels as though she could sleep the rest of the day.  She has had a increase in her Cymbalta by psychiatry from 30 to 60 mg recently.  Is also taking Topamax for migraine headaches first thing in the morning.  He has not had any fevers or chills over the last few days.  She was diagnosed with flu prior to Christmas.   Review of Systems See HPI   Past Medical History:  Diagnosis Date   Adenomatous colon polyp    Anxiety    PHOBIAS   Arthritis    Breast cancer ([place]) 08/08/13   right LOQ   Cataract    Chronic insomnia    Cluster headaches    history of migraines / NONE FOR SEVERAL YRS   Depression    Diverticulosis    Fatty liver 2011   Fibromyalgia    GERD (gastroesophageal reflux disease)    H/O hiatal hernia    History of transfusion 08/30/2013   Hx of radiation therapy 10/29/13- 12/14/13   right chest wall 5040 cGy 28 sessions, right supraclavicular/axillary region 5040 cGy 28 sessions, right chest wall boost 1000 cGy 5 sessions    Hypothyroidism    Internal hemorrhoids    Irritable bowel syndrome    Kidney stone    Lymphedema    RT ARM - WEARS SLEEVE   Macular degeneration    hole/right eye   MDD (major depressive disorder)    Osteopenia    Other abnormal glucose    Other and unspecified hyperlipidemia    Pain in joint, shoulder region    Pneumonia 5852,7782   Sleep apnea    USES C-PAP   Stress incontinence, female     Social History   Socioeconomic History   Marital status: Married    Spouse name: Fritz Pickerel   Number of children: 3   Years of education: Not on file   Highest education level: GED or equivalent  Occupational History   Occupation: retired Radiation protection practitioner  Tobacco Use   Smoking status: Former    Packs/day: 0.10    Years: 2.00    Total pack years: 0.20    Types: Cigarettes    Start date: 11/16/1959    Quit date: 11/15/1960    Years since quitting: 62.0    Passive exposure: Past (over 61 years ago)   Smokeless tobacco: Never  Vaping Use   Vaping Use: Never used  Substance and Sexual Activity   Alcohol use: Yes    Comment: rarely   Drug use: No   Sexual activity: Yes    Comment: menarche age 58, fist live birth 69, P 3, hysterectomy age 36, no HRT, BCP 2 yrs  Other Topics Concern  Not on file  Social History Narrative   Occupation:  Retired Radiation protection practitioner    Married with 3 grown children      Never Smoked     Alcohol use-no        Social Determinants of Radio broadcast assistant Strain: Low Risk  (10/24/2022)   Overall Financial Resource Strain (CARDIA)    Difficulty of Paying Living Expenses: Not hard at all  Food Insecurity: No Food Insecurity (10/24/2022)   Hunger Vital Sign    Worried About Running Out of Food in the Last Year: Never true    [place] in the Last Year: Never true  Transportation Needs: No Transportation Needs (10/24/2022)   PRAPARE - Hydrologist (Medical): No    Lack of Transportation (Non-Medical): No  Physical Activity:  Unknown (10/24/2022)   Exercise Vital Sign    Days of Exercise per Week: 0 days    Minutes of Exercise per Session: Not on file  Stress: Stress Concern Present (10/24/2022)   [place]    Feeling of Stress : Rather much  Social Connections: Unknown (10/24/2022)   Social Connection and Isolation Panel [NHANES]    Frequency of Communication with Friends and Family: More than three times a week    Frequency of Social Gatherings with Friends and Family: Twice a week    Attends Religious Services: Not on file    Active Member of Clubs or Organizations: Yes    Attends Archivist Meetings: 1 to 4 times per year    Marital Status: Married  Human resources officer Violence: Not At Risk (07/22/2021)   Humiliation, Afraid, Rape, and Kick questionnaire    Fear of Current or Ex-Partner: No    Emotionally Abused: No    Physically Abused: No    Sexually Abused: No    Past Surgical History:  Procedure Laterality Date   ABDOMINAL HYSTERECTOMY     APPENDECTOMY     BILATERAL TOTAL MASTECTOMY WITH AXILLARY LYMPH NODE DISSECTION  08/30/2013   Dr Barry Dienes   BREAST CYST ASPIRATION     9 cysts   CATARACT EXTRACTION, BILATERAL  2005/2007   CHOLECYSTECTOMY     COLONOSCOPY     CYSTOCELE REPAIR     EVACUATION BREAST HEMATOMA Left 08/31/2013   Procedure: EVACUATION HEMATOMA BREAST;  Surgeon: Stark Klein, MD;  Location: [place];  Service: General;  Laterality: Left;   EYE SURGERY     to repair macular hole   FOOT ARTHROPLASTY     lt    GANGLION CYST EXCISION     rt foot   HEMORRHOID SURGERY     03/1993   JOINT REPLACEMENT  03/15/11   left knee replacement   KNEE ARTHROSCOPY     /partial knee 2016/left knee 2012   MASS EXCISION  11/04/2011   Procedure: EXCISION MASS;  Surgeon: Cammie Sickle., MD;  Location: [place];  Service: Orthopedics;  Laterality: Right;  excisional biopsy right ulna mass   MASTECTOMY W/  SENTINEL NODE BIOPSY Right 08/30/2013   Procedure: RIGHT  AXILLARY SENTINEL LYMPH NODE BIOPSY; Right Axillary Node Disection;  Surgeon: Stark Klein, MD;  Location: [place];  Service: General;  Laterality: Right;  Right side nuc med 7:00    PARTIAL KNEE ARTHROPLASTY Right 11/03/2015   Procedure: RIGHT KNEE MEDIAL UNICOMPARTMENTAL ARTHROPLASTY ;  Surgeon: Gaynelle Arabian, MD;  Location[facility];  Service: Orthopedics;  Laterality: Right;   RECTOCELE REPAIR     SIMPLE MASTECTOMY WITH AXILLARY SENTINEL NODE BIOPSY Left 08/30/2013   Procedure: Bilateral Breast Mastectomy ;  Surgeon: Stark Klein, MD;  Location: [place];  Service: General;  Laterality: Left;   skin tags removed     breast, panty line, neckline   TOE SURGERY     preventative crossover toe surg/right foot   TOE SURGERY  2009   left foot/screw  in 2nd toe   TONSILLECTOMY     UPPER GASTROINTESTINAL ENDOSCOPY      Family History  Problem Relation Age of Onset   Stroke Mother        died age 70   Diabetes Mother    Breast cancer Mother 62   Breast cancer Sister 70   Breast cancer Paternal Aunt 51   Diabetes Maternal Grandfather    Breast cancer Paternal Grandmother 54   Breast cancer Paternal Aunt        dx in her 54s   Cancer Maternal Grandmother        intra-abdominal cancer   Brain cancer Maternal Uncle 8   Brain cancer Cousin 44       maternal cousin   Brain cancer Cousin 48       paternal cousin   Colon cancer Neg Hx     No Known Allergies  Current Outpatient Medications on File Prior to Visit  Medication Sig Dispense Refill   Bacillus Coagulans-Inulin (PROBIOTIC) 1-250 BILLION-MG CAPS Take by mouth.     butalbital-acetaminophen-caffeine (FIORICET) 50-325-40 MG tablet Take 1 tablet by mouth as needed for headache. 30 tablet 2   Calcium Carbonate-Vitamin D (CALCIUM-VITAMIN D3 PO) Take 1 tablet by mouth in the morning and at bedtime.     Cholecalciferol (VITAMIN D3) 50 MCG (2000 UT) TABS Take 2 tablets by mouth daily.      diazepam (VALIUM) 5 MG tablet Take 5 mg by mouth every 6 (six) hours as needed for anxiety. As needed (flying & dental)     DULoxetine (CYMBALTA) 60 MG capsule Take 1 capsule (60 mg total) by mouth daily. 90 capsule 1   esomeprazole (NEXIUM) 40 MG capsule Take 1 capsule (40 mg total) by mouth daily at 12 noon. (Patient taking differently: Take 40 mg by mouth daily before breakfast.) 365 capsule 0   famotidine (PEPCID) 40 MG tablet TAKE ONE TABLET BY MOUTH EVERY NIGHT AT BEDTIME 90 tablet 0   HYDROcodone bit-homatropine (HYCODAN) 5-1.5 MG/5ML syrup Take 5 mLs by mouth every 6 (six) hours as needed for cough. 120 mL 0   levothyroxine (SYNTHROID) 100 MCG tablet TAKE ONE TABLET BY MOUTH DAILY 365 tablet 0   MELATONIN PO Take 1 tablet by mouth daily.     mirabegron ER (MYRBETRIQ) 50 MG TB24 tablet Take 1 tablet (50 mg total) by mouth daily. 90 tablet 3   montelukast (SINGULAIR) 10 MG tablet TAKE 1 TABLET BY MOUTH AT BEDTIME 90 tablet 1   nitrofurantoin, macrocrystal-monohydrate, (MACROBID) 100 MG capsule Take 1 capsule (100 mg total) by mouth at bedtime. 365 capsule 0   omega-3 acid ethyl esters (LOVAZA) 1 g capsule TAKE ONE CAPSULE BY MOUTH TWICE A DAY 180 capsule 3   Polyethylene Glycol 3350 (MIRALAX PO) Take by mouth. 1/2 capful every morning     pregabalin (LYRICA) 150 MG capsule Take 1 capsule (150 mg total) by mouth 2 (two) times daily. 180 capsule 0   rosuvastatin (CRESTOR) 20 MG tablet Take 1 tablet (20 mg  total) by mouth daily. 365 tablet 0   topiramate (TOPAMAX) 25 MG tablet TAKE ONE TABLET BY MOUTH DAILY 325 tablet 0   No current facility-administered medications on file prior to visit.    BP 110/70   Pulse 79   Temp 98.1 F (36.7 C) (Oral)   Ht '4\' 11"'$  (1.499 m)   Wt 176 lb (79.8 kg)   SpO2 95%   BMI 35.55 kg/m       Objective:   Physical Exam Vitals and nursing note reviewed.  Constitutional:      Appearance: Normal appearance.  Cardiovascular:     Rate and Rhythm:  Normal rate and regular rhythm.     Pulses: Normal pulses.     Heart sounds: Normal heart sounds.  Pulmonary:     Effort: Pulmonary effort is normal.     Breath sounds: Normal breath sounds.  Skin:    General: Skin is warm and dry.  Neurological:     General: No focal deficit present.     Mental Status: She is oriented to person, place, and time.  Psychiatric:        Mood and Affect: Mood normal.        Behavior: Behavior normal.        Thought Content: Thought content normal.        Judgment: Judgment normal.       Assessment & Plan:  1. Other fatigue -I will have her switch her Topamax to evening dosing.  DC melatonin for sleep.  If this does not help then she was advised to contact her psychiatrist to see if he believes that it could be  Cymbalta.  [name], NP  Time spent with patient today was 34 minutes which consisted of chart review, discussing diagnosis, work up, treatment answering questions, listening,  and documentation.

## 2022-12-01 ENCOUNTER — Other Ambulatory Visit: Payer: Self-pay | Admitting: Adult Health

## 2022-12-01 DIAGNOSIS — Z8669 Personal history of other diseases of the nervous system and sense organs: Secondary | ICD-10-CM

## 2022-12-02 ENCOUNTER — Encounter: Payer: Self-pay | Admitting: Podiatry

## 2022-12-02 ENCOUNTER — Ambulatory Visit (INDEPENDENT_AMBULATORY_CARE_PROVIDER_SITE_OTHER): Payer: Medicare HMO | Admitting: Podiatry

## 2022-12-02 VITALS — BP 136/78 | HR 73

## 2022-12-02 DIAGNOSIS — M79676 Pain in unspecified toe(s): Secondary | ICD-10-CM

## 2022-12-02 DIAGNOSIS — B351 Tinea unguium: Secondary | ICD-10-CM

## 2022-12-02 NOTE — Progress Notes (Signed)
Subjective:  [name], female    DOB: 1941-07-26,  MRN: [id number] HPI Chief Complaint  Patient presents with   Toe Pain    Hallux right - couple months ago the base of the nail was very swollen, red, tender, saw PCP-no treatment, it got better and now tender again, toenails is discolored and peeled at base   New Patient (Initial Visit)    Est pt 2018    82 y.o. female presents with the above complaint.   ROS: Denies fever chills nausea vomit muscle aches pains calf pain back pain chest pain shortness of breath.  Past Medical History:  Diagnosis Date   Adenomatous colon polyp    Anxiety    PHOBIAS   Arthritis    Breast cancer ([place]) 08/08/13   right LOQ   Cataract    Chronic insomnia    Cluster headaches    history of migraines / NONE FOR SEVERAL YRS   Depression    Diverticulosis    Fatty liver 2011   Fibromyalgia    GERD (gastroesophageal reflux disease)    H/O hiatal hernia    History of transfusion 08/30/2013   Hx of radiation therapy 10/29/13- 12/14/13   right chest wall 5040 cGy 28 sessions, right supraclavicular/axillary region 5040 cGy 28 sessions, right chest wall boost 1000 cGy 5 sessions   Hypothyroidism    Internal hemorrhoids    Irritable bowel syndrome    Kidney stone    Lymphedema    RT ARM - WEARS SLEEVE   Macular degeneration    hole/right eye   MDD (major depressive disorder)    Osteopenia    Other abnormal glucose    Other and unspecified hyperlipidemia    Pain in joint, shoulder region    Pneumonia 2671,2458   Sleep apnea    USES C-PAP   Stress incontinence, female    Past Surgical History:  Procedure Laterality Date   ABDOMINAL HYSTERECTOMY     APPENDECTOMY     BILATERAL TOTAL MASTECTOMY WITH AXILLARY LYMPH NODE DISSECTION  08/30/2013   Dr Barry Dienes   BREAST CYST ASPIRATION     9 cysts   CATARACT EXTRACTION, BILATERAL  2005/2007   CHOLECYSTECTOMY     COLONOSCOPY     CYSTOCELE REPAIR     EVACUATION BREAST HEMATOMA Left  08/31/2013   Procedure: EVACUATION HEMATOMA BREAST;  Surgeon: Stark Klein, MD;  Location: [place];  Service: General;  Laterality: Left;   EYE SURGERY     to repair macular hole   FOOT ARTHROPLASTY     lt    GANGLION CYST EXCISION     rt foot   HEMORRHOID SURGERY     03/1993   JOINT REPLACEMENT  03/15/11   left knee replacement   KNEE ARTHROSCOPY     /partial knee 2016/left knee 2012   MASS EXCISION  11/04/2011   Procedure: EXCISION MASS;  Surgeon: Cammie Sickle., MD;  Location: [place];  Service: Orthopedics;  Laterality: Right;  excisional biopsy right ulna mass   MASTECTOMY W/ SENTINEL NODE BIOPSY Right 08/30/2013   Procedure: RIGHT  AXILLARY SENTINEL LYMPH NODE BIOPSY; Right Axillary Node Disection;  Surgeon: Stark Klein, MD;  Location: [place];  Service: General;  Laterality: Right;  Right side nuc med 7:00    PARTIAL KNEE ARTHROPLASTY Right 11/03/2015   Procedure: RIGHT KNEE MEDIAL UNICOMPARTMENTAL ARTHROPLASTY ;  Surgeon: Gaynelle Arabian, MD;  [facility];  Service: Orthopedics;  Laterality: Right;  RECTOCELE REPAIR     SIMPLE MASTECTOMY WITH AXILLARY SENTINEL NODE BIOPSY Left 08/30/2013   Procedure: Bilateral Breast Mastectomy ;  Surgeon: Stark Klein, MD;  Location: [place];  Service: General;  Laterality: Left;   skin tags removed     breast, panty line, neckline   TOE SURGERY     preventative crossover toe surg/right foot   TOE SURGERY  2009   left foot/screw  in 2nd toe   TONSILLECTOMY     UPPER GASTROINTESTINAL ENDOSCOPY      Current Outpatient Medications:    Bacillus Coagulans-Inulin (PROBIOTIC) 1-250 BILLION-MG CAPS, Take by mouth., Disp: , Rfl:    butalbital-acetaminophen-caffeine (FIORICET) 50-325-40 MG tablet, Take 1 tablet by mouth as needed for headache., Disp: 30 tablet, Rfl: 2   Calcium Carbonate-Vitamin D (CALCIUM-VITAMIN D3 PO), Take 1 tablet by mouth in the morning and at bedtime., Disp: , Rfl:    Cholecalciferol (VITAMIN D3) 50 MCG  (2000 UT) TABS, Take 2 tablets by mouth daily., Disp: , Rfl:    diazepam (VALIUM) 5 MG tablet, Take 5 mg by mouth every 6 (six) hours as needed for anxiety. As needed (flying & dental), Disp: , Rfl:    DULoxetine (CYMBALTA) 60 MG capsule, Take 1 capsule (60 mg total) by mouth daily., Disp: 90 capsule, Rfl: 1   esomeprazole (NEXIUM) 40 MG capsule, Take 1 capsule (40 mg total) by mouth daily at 12 noon. (Patient taking differently: Take 40 mg by mouth daily before breakfast.), Disp: 365 capsule, Rfl: 0   famotidine (PEPCID) 40 MG tablet, TAKE ONE TABLET BY MOUTH EVERY NIGHT AT BEDTIME, Disp: 90 tablet, Rfl: 0   levothyroxine (SYNTHROID) 100 MCG tablet, TAKE ONE TABLET BY MOUTH DAILY, Disp: 365 tablet, Rfl: 0   mirabegron ER (MYRBETRIQ) 50 MG TB24 tablet, Take 1 tablet (50 mg total) by mouth daily., Disp: 90 tablet, Rfl: 3   montelukast (SINGULAIR) 10 MG tablet, TAKE 1 TABLET BY MOUTH AT BEDTIME, Disp: 90 tablet, Rfl: 1   nitrofurantoin, macrocrystal-monohydrate, (MACROBID) 100 MG capsule, Take 1 capsule (100 mg total) by mouth at bedtime., Disp: 365 capsule, Rfl: 0   omega-3 acid ethyl esters (LOVAZA) 1 g capsule, TAKE ONE CAPSULE BY MOUTH TWICE A DAY, Disp: 180 capsule, Rfl: 3   Polyethylene Glycol 3350 (MIRALAX PO), Take by mouth. 1/2 capful every morning, Disp: , Rfl:    pregabalin (LYRICA) 150 MG capsule, Take 1 capsule (150 mg total) by mouth 2 (two) times daily., Disp: 180 capsule, Rfl: 0   rosuvastatin (CRESTOR) 20 MG tablet, Take 1 tablet (20 mg total) by mouth daily., Disp: 365 tablet, Rfl: 0   topiramate (TOPAMAX) 25 MG tablet, TAKE ONE TABLET BY MOUTH DAILY, Disp: 325 tablet, Rfl: 0  No Known Allergies Review of Systems Objective:   Vitals:   12/02/22 1115  BP: 136/78  Pulse: 73    General: Well developed, nourished, in no acute distress, alert and oriented x3   Dermatological: Skin is warm, dry and supple bilateral. Nails x 10 are long thick yellow dystrophic with mycotic the  hallux nail right demonstrates proximal onychodystrophy.  Remaining integument appears unremarkable at this time. There are no open sores, no preulcerative lesions, no rash or signs of infection present.  Vascular: Dorsalis Pedis artery and Posterior Tibial artery pedal pulses are 2/4 bilateral with immedate capillary fill time. Pedal hair growth present. No varicosities and no lower extremity edema present bilateral.   Neruologic: Grossly intact via light touch bilateral. Vibratory intact via  tuning fork bilateral. Protective threshold with Semmes Wienstein monofilament intact to all pedal sites bilateral. Patellar and Achilles deep tendon reflexes 2+ bilateral. No Babinski or clonus noted bilateral.   Musculoskeletal: No gross boney pedal deformities bilateral. No pain, crepitus, or limitation noted with foot and ankle range of motion bilateral. Muscular strength 5/5 in all groups tested bilateral.  Gait: Unassisted, Nonantalgic.    Radiographs:  None taken  Assessment & Plan:   Assessment: Pain limb secondary onychomycosis onychodystrophy hallux.  Plan: Trim nails 1 through 5 bilateral.     Austen Wygant T. [place], [place]

## 2022-12-03 ENCOUNTER — Encounter: Payer: Self-pay | Admitting: Adult Health

## 2022-12-07 ENCOUNTER — Other Ambulatory Visit: Payer: Self-pay | Admitting: Adult Health

## 2022-12-07 MED ORDER — MIRABEGRON ER 50 MG PO TB24: 50.0000 mg | ORAL_TABLET | Freq: Every day | ORAL | 0 refills | Status: DC

## 2022-12-22 ENCOUNTER — Ambulatory Visit: Payer: Medicare HMO | Admitting: Family Medicine

## 2022-12-28 ENCOUNTER — Encounter: Payer: Self-pay | Admitting: Family Medicine

## 2022-12-28 ENCOUNTER — Ambulatory Visit (INDEPENDENT_AMBULATORY_CARE_PROVIDER_SITE_OTHER): Payer: Medicare HMO | Admitting: Family Medicine

## 2022-12-28 ENCOUNTER — Ambulatory Visit: Payer: Medicare HMO | Admitting: Internal Medicine

## 2022-12-28 ENCOUNTER — Ambulatory Visit: Payer: Medicare HMO | Admitting: Adult Health

## 2022-12-28 VITALS — BP 128/80 | HR 79 | Temp 97.7°F | Resp 16 | Ht 59.0 in | Wt 178.1 lb

## 2022-12-28 DIAGNOSIS — R350 Frequency of micturition: Secondary | ICD-10-CM

## 2022-12-28 DIAGNOSIS — N3281 Overactive bladder: Secondary | ICD-10-CM | POA: Diagnosis not present

## 2022-12-28 LAB — POC URINALSYSI DIPSTICK (AUTOMATED)
Bilirubin, UA: NEGATIVE
Blood, UA: NEGATIVE
Glucose, UA: NEGATIVE
Ketones, UA: NEGATIVE
Nitrite, UA: NEGATIVE
Protein, UA: NEGATIVE
Spec Grav, UA: 1.015 (ref 1.010–1.025)
Urobilinogen, UA: 0.2 E.U./dL
pH, UA: 7.5 (ref 5.0–8.0)

## 2022-12-28 NOTE — Progress Notes (Signed)
ACUTE VISIT Chief Complaint  Patient presents with   Urinary Tract Infection   HPI: [name] is a 82 y.o. female with PMHx significant for hypothyroidism, hyperlipidemia, insomnia, hearing loss, GERD, IBS, overactive bladder, breast cancer, and fibromyalgia here today with her husband complaining of urinary frequency and incontinence.  No associated dysuria. Nocturia x 0-4. She reports experiencing a high frequency of urination during the day, ranging from 6 to 13 times. She has a history of overactive bladder.   She denies any pelvic pain, nausea, vomiting, fever, chills, or back pain associated with her symptoms and has not observed any blood in her urine.  Urinary Frequency  This is a chronic problem. The current episode started more than 1 year ago. The problem has been unchanged. The patient is experiencing no pain. There has been no fever. There is No history of pyelonephritis. Associated symptoms include frequency. Pertinent negatives include no chills, discharge, flank pain, hematuria, hesitancy, nausea, sweats, urgency or vomiting. She has tried antibiotics for the symptoms. Her past medical history is significant for recurrent UTIs.  She has previously undergone surgical procedure  to repair rectocele/ cystocele and had hysterectomy.  She follows with urologist, who recommended Trospium, which she discontinued because aggravated problem. Last visit 05/2022. Currently taking Myrbetriq 50 mg daily, which is not helping. She has also been prescribed nitrofurantoin 100 mg and takes it daily. Ucx 06/30/22    Component 6 mo ago  MICRO NUMBER: [id number]  SPECIMEN QUALITY: Adequate  Sample Source URINE  STATUS: FINAL  Result: No Growth  Resulting Agency      Lab Results  Component Value Date   HGBA1C 6.2 05/25/2022   She reports wearing special underwear with pads x 2. She consumes caffeine: Coffee in the morning and ZipFizz in the afternoon, which according to her  husband, contains 100 mg of caffeine.   Review of Systems  Constitutional:  Positive for fatigue. Negative for chills and fever.  Gastrointestinal:  Negative for abdominal pain, nausea and vomiting.  Endocrine: Negative for polydipsia, polyphagia and polyuria.  Genitourinary:  Positive for frequency. Negative for flank pain, hematuria, hesitancy, urgency and vaginal bleeding.  Skin:  Negative for rash.  Neurological:  Negative for syncope and weakness.  See other pertinent positives and negatives in HPI.  Current Outpatient Medications on File Prior to Visit  Medication Sig Dispense Refill   Bacillus Coagulans-Inulin (PROBIOTIC) 1-250 BILLION-MG CAPS Take by mouth.     butalbital-acetaminophen-caffeine (FIORICET) 50-325-40 MG tablet Take 1 tablet by mouth as needed for headache. 30 tablet 2   Calcium Carbonate-Vitamin D (CALCIUM-VITAMIN D3 PO) Take 1 tablet by mouth in the morning and at bedtime.     Cholecalciferol (VITAMIN D3) 50 MCG (2000 UT) TABS Take 2 tablets by mouth daily.     diazepam (VALIUM) 5 MG tablet Take 5 mg by mouth every 6 (six) hours as needed for anxiety. As needed (flying & dental)     DULoxetine (CYMBALTA) 60 MG capsule Take 1 capsule (60 mg total) by mouth daily. 90 capsule 1   esomeprazole (NEXIUM) 40 MG capsule Take 1 capsule (40 mg total) by mouth daily at 12 noon. (Patient taking differently: Take 40 mg by mouth daily before breakfast.) 365 capsule 0   famotidine (PEPCID) 40 MG tablet TAKE ONE TABLET BY MOUTH EVERY NIGHT AT BEDTIME 90 tablet 0   levothyroxine (SYNTHROID) 100 MCG tablet TAKE ONE TABLET BY MOUTH DAILY 365 tablet 0   mirabegron ER (MYRBETRIQ) 50 MG TB24  tablet Take 1 tablet (50 mg total) by mouth daily. 30 tablet 0   montelukast (SINGULAIR) 10 MG tablet TAKE 1 TABLET BY MOUTH AT BEDTIME 90 tablet 1   nitrofurantoin, macrocrystal-monohydrate, (MACROBID) 100 MG capsule Take 1 capsule (100 mg total) by mouth at bedtime. 365 capsule 0   omega-3 acid ethyl  esters (LOVAZA) 1 g capsule TAKE ONE CAPSULE BY MOUTH TWICE A DAY 180 capsule 3   Polyethylene Glycol 3350 (MIRALAX PO) Take by mouth. 1/2 capful every morning     rosuvastatin (CRESTOR) 20 MG tablet Take 1 tablet (20 mg total) by mouth daily. 365 tablet 0   topiramate (TOPAMAX) 25 MG tablet TAKE ONE TABLET BY MOUTH DAILY 325 tablet 0   pregabalin (LYRICA) 150 MG capsule Take 1 capsule (150 mg total) by mouth 2 (two) times daily. 180 capsule 0   No current facility-administered medications on file prior to visit.   Past Medical History:  Diagnosis Date   Adenomatous colon polyp    Anxiety    PHOBIAS   Arthritis    Breast cancer ([place]) 08/08/13   right LOQ   Cataract    Chronic insomnia    Cluster headaches    history of migraines / NONE FOR SEVERAL YRS   Depression    Diverticulosis    Fatty liver 2011   Fibromyalgia    GERD (gastroesophageal reflux disease)    H/O hiatal hernia    History of transfusion 08/30/2013   Hx of radiation therapy 10/29/13- 12/14/13   right chest wall 5040 cGy 28 sessions, right supraclavicular/axillary region 5040 cGy 28 sessions, right chest wall boost 1000 cGy 5 sessions   Hypothyroidism    Internal hemorrhoids    Irritable bowel syndrome    Kidney stone    Lymphedema    RT ARM - WEARS SLEEVE   Macular degeneration    hole/right eye   MDD (major depressive disorder)    Osteopenia    Other abnormal glucose    Other and unspecified hyperlipidemia    Pain in joint, shoulder region    Pneumonia [id number]   Sleep apnea    USES C-PAP   Stress incontinence, female    No Known Allergies  Social History   Socioeconomic History   Marital status: Married    Spouse name: Fritz Pickerel   Number of children: 3   Years of education: Not on file   Highest education level: GED or equivalent  Occupational History   Occupation: retired Radiation protection practitioner  Tobacco Use   Smoking status: Former    Packs/day: 0.10    Years: 2.00    Total pack years: 0.20    Types:  Cigarettes    Start date: 11/16/1959    Quit date: 11/15/1960    Years since quitting: 62.1    Passive exposure: Past (over 76 years ago)   Smokeless tobacco: Never  Vaping Use   Vaping Use: Never used  Substance and Sexual Activity   Alcohol use: Yes    Comment: rarely   Drug use: No   Sexual activity: Yes    Comment: menarche age 89, fist live birth 80, P 3, hysterectomy age 69, no HRT, BCP 2 yrs  Other Topics Concern   Not on file  Social History Narrative   Occupation:  Retired Radiation protection practitioner    Married with 3 grown children      Never Smoked     Alcohol use-no        Social Determinants of Health  Financial Resource Strain: Low Risk  (10/24/2022)   Overall Financial Resource Strain (CARDIA)    Difficulty of Paying Living Expenses: Not hard at all  Food Insecurity: No Food Insecurity (10/24/2022)   Hunger Vital Sign    Worried About Running Out of Food in the Last Year: Never true    Ran Out of Food in the Last Year: Never true  Transportation Needs: No Transportation Needs (10/24/2022)   PRAPARE - Hydrologist (Medical): No    Lack of Transportation (Non-Medical): No  Physical Activity: Unknown (10/24/2022)   Exercise Vital Sign    Days of Exercise per Week: 0 days    Minutes of Exercise per Session: Not on file  Stress: Stress Concern Present (10/24/2022)   [place]    Feeling of Stress : Rather much  Social Connections: Unknown (10/24/2022)   Social Connection and Isolation Panel [NHANES]    Frequency of Communication with Friends and Family: More than three times a week    Frequency of Social Gatherings with Friends and Family: Twice a week    Attends Religious Services: Not on Advertising copywriter or Organizations: Yes    Attends Archivist Meetings: 1 to 4 times per year    Marital Status: Married   Vitals:   12/28/22 1420  BP: 128/80  Pulse: 79   Resp: 16  Temp: 97.7 F (36.5 C)  SpO2: 95%   Body mass index is 35.98 kg/m.  Physical Exam Vitals and nursing note reviewed.  Constitutional:      General: She is not in acute distress.    Appearance: She is well-developed.  HENT:     Head: Normocephalic and atraumatic.     Mouth/Throat:     Mouth: Mucous membranes are moist.  Eyes:     Conjunctiva/sclera: Conjunctivae normal.  Cardiovascular:     Rate and Rhythm: Normal rate and regular rhythm.  Pulmonary:     Effort: Pulmonary effort is normal. No respiratory distress.     Breath sounds: Normal breath sounds.  Abdominal:     Palpations: Abdomen is soft. There is no mass.     Tenderness: There is no abdominal tenderness. There is no right CVA tenderness or left CVA tenderness.  Musculoskeletal:     Right lower leg: No edema.     Left lower leg: No edema.  Skin:    General: Skin is warm.     Findings: No erythema.  Neurological:     Mental Status: She is alert and oriented to person, place, and time.  Psychiatric:        Mood and Affect: Affect normal. Mood is anxious.   ASSESSMENT AND PLAN:  Ms. Nare was seen today for urinary tract infection.  Diagnoses and all orders for this visit:  Urinary frequency We discussed possible causes. Hx is not suggestive of UTI. Urine dipstick negative except for small amount of leukocytes.  She is already on Nitrofurantoin 100 mg daily. She agrees with holding on adding abx until Ucx is back. Continue adequate hydration. Instructed about warning signs.  -     POCT Urinalysis Dipstick (Automated) -     Culture, Urine; Future -     Culture, Urine  Overactive bladder Symptoms are getting worse. Myrbetriq is not helping. Recommend stopping caffeine intake. Arrange appt with urologist. Kegel and pelvic floor exercises recommended, may help.  I spent a total  of 32 minutes in both face to face and non face to face activities for this visit on the date of this encounter.  During this time history was obtained and documented, examination was performed, prior labs reviewed, and assessment/plan discussed.  Return if symptoms worsen or fail to improve.  Havier Deeb G. Martinique, MD  [facility] [place] office.

## 2022-12-28 NOTE — Patient Instructions (Addendum)
A few things to remember from today's visit:  Urinary frequency - Plan: POCT Urinalysis Dipstick (Automated), Culture, Urine  Overactive bladder  Symptoms are more suggestive of overactive bladder. Recommend arranging appt with your urologist. Avoid caffeine. We will follow urine culture.  If you need refills for medications you take chronically, please call your pharmacy. Do not use My Chart to request refills or for acute issues that need immediate attention. If you send a my chart message, it may take a few days to be addressed, specially if I am not in the office.  Please be sure medication list is accurate. If a new problem present, please set up appointment sooner than planned today.

## 2022-12-29 LAB — URINE CULTURE
MICRO NUMBER:: 14558467
SPECIMEN QUALITY:: ADEQUATE

## 2023-01-03 ENCOUNTER — Encounter: Payer: Self-pay | Admitting: Family Medicine

## 2023-01-03 ENCOUNTER — Ambulatory Visit (INDEPENDENT_AMBULATORY_CARE_PROVIDER_SITE_OTHER): Payer: Medicare HMO | Admitting: Family Medicine

## 2023-01-03 VITALS — BP 126/70 | HR 100 | Temp 97.8°F | Resp 16 | Ht 59.0 in

## 2023-01-03 DIAGNOSIS — R1011 Right upper quadrant pain: Secondary | ICD-10-CM

## 2023-01-03 DIAGNOSIS — N390 Urinary tract infection, site not specified: Secondary | ICD-10-CM

## 2023-01-03 DIAGNOSIS — R197 Diarrhea, unspecified: Secondary | ICD-10-CM

## 2023-01-03 DIAGNOSIS — R350 Frequency of micturition: Secondary | ICD-10-CM | POA: Diagnosis not present

## 2023-01-03 LAB — CBC
HCT: 49.8 % — ABNORMAL HIGH (ref 36.0–46.0)
Hemoglobin: 16.8 g/dL — ABNORMAL HIGH (ref 12.0–15.0)
MCHC: 33.8 g/dL (ref 30.0–36.0)
MCV: 88.4 fl (ref 78.0–100.0)
Platelets: 217 10*3/uL (ref 150.0–400.0)
RBC: 5.64 Mil/uL — ABNORMAL HIGH (ref 3.87–5.11)
RDW: 14.6 % (ref 11.5–15.5)
WBC: 9.6 10*3/uL (ref 4.0–10.5)

## 2023-01-03 LAB — COMPREHENSIVE METABOLIC PANEL
ALT: 19 U/L (ref 0–35)
AST: 17 U/L (ref 0–37)
Albumin: 4.6 g/dL (ref 3.5–5.2)
Alkaline Phosphatase: 65 U/L (ref 39–117)
BUN: 15 mg/dL (ref 6–23)
CO2: 25 mEq/L (ref 19–32)
Calcium: 9.9 mg/dL (ref 8.4–10.5)
Chloride: 104 mEq/L (ref 96–112)
Creatinine, Ser: 0.8 mg/dL (ref 0.40–1.20)
GFR: 69.93 mL/min (ref >60.00)
Glucose, Bld: 150 mg/dL — ABNORMAL HIGH (ref 70–99)
Potassium: 3.8 mEq/L (ref 3.5–5.1)
Sodium: 138 mEq/L (ref 135–145)
Total Bilirubin: 1.2 mg/dL (ref 0.2–1.2)
Total Protein: 7.4 g/dL (ref 6.0–8.3)

## 2023-01-03 LAB — POC URINALSYSI DIPSTICK (AUTOMATED)
Bilirubin, UA: NEGATIVE
Blood, UA: POSITIVE
Glucose, UA: NEGATIVE
Nitrite, UA: NEGATIVE
Protein, UA: POSITIVE — AB
Spec Grav, UA: 1.025 (ref 1.010–1.025)
Urobilinogen, UA: 0.2 E.U./dL
pH, UA: 5.5 (ref 5.0–8.0)

## 2023-01-03 MED ORDER — SULFAMETHOXAZOLE-TRIMETHOPRIM 800-160 MG PO TABS: 1.0000 | ORAL_TABLET | Freq: Two times a day (BID) | ORAL | 0 refills | Status: AC

## 2023-01-03 NOTE — Patient Instructions (Addendum)
A few things to remember from today's visit:  Urinary frequency - Plan: POCT Urinalysis Dipstick (Automated), Culture, Urine, Culture, Urine  Diarrhea, unspecified type - Plan: CBC, Comprehensive metabolic panel  RUQ abdominal pain - Plan: Comprehensive metabolic panel  Urinary tract infection without hematuria, site unspecified - Plan: sulfamethoxazole-trimethoprim (BACTRIM DS) 800-160 MG tablet  Adequate fluid intake, avoid holding urine for long hours, and over the counter Vit C OR cranberry capsules might help.  Today we will treat empirically with antibiotic, which we might need to change when urine culture comes back depending of bacteria susceptibility. Stop Nitrofurantoin. Continue daily probiotic. Seek immediate medical attention if severe abdominal pain, vomiting, fever/chills, or worsening symptoms. F/U if symptomatic are not any better after 2-3 days of antibiotic treatment.  Do not use My Chart to request refills or for acute issues that need immediate attention. If you send a my chart message, it may take a few days to be addressed, specially if I am not in the office.  Please be sure medication list is accurate. If a new problem present, please set up appointment sooner than planned today.

## 2023-01-03 NOTE — Progress Notes (Signed)
ACUTE VISIT Chief Complaint  Patient presents with   sweating & shivering   Diarrhea   Urinary Frequency   HPI: [name] is a 82 y.o. female, with PMHx significant for hypothyroidism, hyperlipidemia, insomnia, hearing loss, GERD, IBS, overactive bladder, breast cancer, and fibromyalgia who is here today with her husband complaining of  increased urine frequency and loose stools for the past couple of days.  She denies having a fever and initially states that she has no history of diarrhea. According to her husband, she has had episodes of loose stool intermittently for a while. She is not sure about number of stools she has had today, states that she does not feel it when she is having a bowel movement until she wipes and sees stool on the tissue.  According to her husband this is a chronic problem. No saddle anesthesia. No changes in bladder or bowel function, hx of urine incontinence.  Negative for saddle anesthesia.  Decreased appetite, she has not consumed any food for nearly three days but has been drinking electrolytes without caffeine since yesterday.  She denies experiencing fever,abdominal pain, nausea, or vomiting. She has had no recent contact with someone who has been sick.  She also reports having cold-like symptoms for a few days, including a rhinorrhea, nasal congestion, mild sore throat last night, and minimal nonproductive cough this morning. She has undergone two COVID 19 home tests yesterday, both of which were negative.   She was seen on 12/28/22 with similar urinary symptoms. She follows with urologist. Myrbetriq 50 mg daily has not helped. Recurrent UTI, she is on daily Nitrofurantoin 100 mg daily. UCxLess than 10,000 CFU/mL of single Gram negative organism isolated.   On 06/15/22 positive urine culture for E. coli, which was sensitive to Bactrim.  Upon examination today, she has RUQ pain. States that she has no appendix, gallbladder, or  uterus.  Review of Systems  Constitutional:  Positive for activity change and fatigue.  Cardiovascular:  Negative for chest pain, palpitations and leg swelling.  Gastrointestinal:  Negative for blood in stool.  Genitourinary:  Negative for vaginal bleeding and vaginal discharge.  Neurological:  Negative for syncope and weakness.  Psychiatric/Behavioral:  Negative for confusion. The patient is nervous/anxious.   See other pertinent positives and negatives in HPI.  Current Outpatient Medications on File Prior to Visit  Medication Sig Dispense Refill   Bacillus Coagulans-Inulin (PROBIOTIC) 1-250 BILLION-MG CAPS Take by mouth.     butalbital-acetaminophen-caffeine (FIORICET) 50-325-40 MG tablet Take 1 tablet by mouth as needed for headache. 30 tablet 2   Calcium Carbonate-Vitamin D (CALCIUM-VITAMIN D3 PO) Take 1 tablet by mouth in the morning and at bedtime.     Cholecalciferol (VITAMIN D3) 50 MCG (2000 UT) TABS Take 2 tablets by mouth daily.     diazepam (VALIUM) 5 MG tablet Take 5 mg by mouth every 6 (six) hours as needed for anxiety. As needed (flying & dental)     DULoxetine (CYMBALTA) 60 MG capsule Take 1 capsule (60 mg total) by mouth daily. 90 capsule 1   esomeprazole (NEXIUM) 40 MG capsule Take 1 capsule (40 mg total) by mouth daily at 12 noon. (Patient taking differently: Take 40 mg by mouth daily before breakfast.) 365 capsule 0   famotidine (PEPCID) 40 MG tablet TAKE ONE TABLET BY MOUTH EVERY NIGHT AT BEDTIME 90 tablet 0   levothyroxine (SYNTHROID) 100 MCG tablet TAKE ONE TABLET BY MOUTH DAILY 365 tablet 0   mirabegron ER (MYRBETRIQ) 50 MG  TB24 tablet Take 1 tablet (50 mg total) by mouth daily. 30 tablet 0   montelukast (SINGULAIR) 10 MG tablet TAKE 1 TABLET BY MOUTH AT BEDTIME 90 tablet 1   nitrofurantoin, macrocrystal-monohydrate, (MACROBID) 100 MG capsule Take 1 capsule (100 mg total) by mouth at bedtime. 365 capsule 0   omega-3 acid ethyl esters (LOVAZA) 1 g capsule TAKE ONE  CAPSULE BY MOUTH TWICE A DAY 180 capsule 3   Polyethylene Glycol 3350 (MIRALAX PO) Take by mouth. 1/2 capful every morning     rosuvastatin (CRESTOR) 20 MG tablet Take 1 tablet (20 mg total) by mouth daily. 365 tablet 0   topiramate (TOPAMAX) 25 MG tablet TAKE ONE TABLET BY MOUTH DAILY 325 tablet 0   pregabalin (LYRICA) 150 MG capsule Take 1 capsule (150 mg total) by mouth 2 (two) times daily. 180 capsule 0   No current facility-administered medications on file prior to visit.   Past Medical History:  Diagnosis Date   Adenomatous colon polyp    Anxiety    PHOBIAS   Arthritis    Breast cancer ([place]) 08/08/13   right LOQ   Cataract    Chronic insomnia    Cluster headaches    history of migraines / NONE FOR SEVERAL YRS   Depression    Diverticulosis    Fatty liver 2011   Fibromyalgia    GERD (gastroesophageal reflux disease)    H/O hiatal hernia    History of transfusion 08/30/2013   Hx of radiation therapy 10/29/13- 12/14/13   right chest wall 5040 cGy 28 sessions, right supraclavicular/axillary region 5040 cGy 28 sessions, right chest wall boost 1000 cGy 5 sessions   Hypothyroidism    Internal hemorrhoids    Irritable bowel syndrome    Kidney stone    Lymphedema    RT ARM - WEARS SLEEVE   Macular degeneration    hole/right eye   MDD (major depressive disorder)    Osteopenia    Other abnormal glucose    Other and unspecified hyperlipidemia    Pain in joint, shoulder region    Pneumonia [id number]   Sleep apnea    USES C-PAP   Stress incontinence, female    No Known Allergies  Social History   Socioeconomic History   Marital status: Married    Spouse name: Fritz Pickerel   Number of children: 3   Years of education: Not on file   Highest education level: GED or equivalent  Occupational History   Occupation: retired Radiation protection practitioner  Tobacco Use   Smoking status: Former    Packs/day: 0.10    Years: 2.00    Total pack years: 0.20    Types: Cigarettes    Start date: 11/16/1959     Quit date: 11/15/1960    Years since quitting: 62.1    Passive exposure: Past (over 43 years ago)   Smokeless tobacco: Never  Vaping Use   Vaping Use: Never used  Substance and Sexual Activity   Alcohol use: Yes    Comment: rarely   Drug use: No   Sexual activity: Yes    Comment: menarche age 58, fist live birth 77, P 3, hysterectomy age 8, no HRT, BCP 2 yrs  Other Topics Concern   Not on file  Social History Narrative   Occupation:  Retired Radiation protection practitioner    Married with 3 grown children      Never Smoked     Alcohol use-no        Social Determinants of Health  Financial Resource Strain: Low Risk  (10/24/2022)   Overall Financial Resource Strain (CARDIA)    Difficulty of Paying Living Expenses: Not hard at all  Food Insecurity: No Food Insecurity (10/24/2022)   Hunger Vital Sign    Worried About Running Out of Food in the Last Year: Never true    Ran Out of Food in the Last Year: Never true  Transportation Needs: No Transportation Needs (10/24/2022)   PRAPARE - Hydrologist (Medical): No    Lack of Transportation (Non-Medical): No  Physical Activity: Unknown (10/24/2022)   Exercise Vital Sign    Days of Exercise per Week: 0 days    Minutes of Exercise per Session: Not on file  Stress: Stress Concern Present (10/24/2022)   [place]    Feeling of Stress : Rather much  Social Connections: Unknown (10/24/2022)   Social Connection and Isolation Panel [NHANES]    Frequency of Communication with Friends and Family: More than three times a week    Frequency of Social Gatherings with Friends and Family: Twice a week    Attends Religious Services: Not on Advertising copywriter or Organizations: Yes    Attends Archivist Meetings: 1 to 4 times per year    Marital Status: Married   Today's Vitals   01/03/23 0817  BP: 126/70  Pulse: 100  Resp: 16  Temp: 97.8 F  (36.6 C)  TempSrc: Oral  SpO2: 96%  Height: 4' 11"$  (1.499 m)   Body mass index is 35.98 kg/m.  Physical Exam Vitals and nursing note reviewed.  Constitutional:      General: She is not in acute distress.    Appearance: She is well-developed.  HENT:     Head: Normocephalic and atraumatic.     Mouth/Throat:     Mouth: Mucous membranes are moist.     Pharynx: Oropharynx is clear.  Eyes:     Conjunctiva/sclera: Conjunctivae normal.  Cardiovascular:     Rate and Rhythm: Normal rate and regular rhythm.  Pulmonary:     Effort: Pulmonary effort is normal. No respiratory distress.     Breath sounds: Normal breath sounds.  Abdominal:     Palpations: Abdomen is soft. There is no mass.     Tenderness: There is abdominal tenderness in the right upper quadrant. There is no rebound.  Lymphadenopathy:     Cervical: No cervical adenopathy.  Skin:    General: Skin is warm.     Findings: No erythema.  Neurological:     General: No focal deficit present.     Mental Status: She is alert and oriented to person, place, and time.  Psychiatric:        Mood and Affect: Mood is anxious.   ASSESSMENT AND PLAN:  Ms. Hembree is a 82 yo female seen today for diarrhea and increased urine frequency. Lab Results  Component Value Date   WBC 9.6 01/03/2023   HGB 16.8 (H) 01/03/2023   HCT 49.8 (H) 01/03/2023   MCV 88.4 01/03/2023   PLT 217.0 01/03/2023   Lab Results  Component Value Date   CREATININE 0.80 01/03/2023   BUN 15 01/03/2023   NA 138 01/03/2023   K 3.8 01/03/2023   CL 104 01/03/2023   CO2 25 01/03/2023   Lab Results  Component Value Date   ALT 19 01/03/2023   AST 17 01/03/2023   ALKPHOS 65 01/03/2023  BILITOT 1.2 01/03/2023   Urinary frequency Urine dipstick today positive for ketones 3+, blood,protein,and small amount of leukocytes. Will follow Ucx. Overactive bladder following with urologist.  -     POCT Urinalysis Dipstick (Automated) -     Urine Culture;  Future  Diarrhea, unspecified type She is reporting problem as new but her husband states that this is a chronic problem that happens intermittently, hx of IBS. Continue adequate hydration.  -     CBC; Future -     Comprehensive metabolic panel; Future  RUQ abdominal pain Noted on examination, denies prior hx. Fibromyalgia (abdominal wall) and IBS could be contributing factors. Instructed about warning signs. F/U with PCP in 10 days, before if needed. CMP ordered today. I do not think imaging is needed today.  -     Comprehensive metabolic panel; Future  Urinary tract infection without hematuria, site unspecified Reporting worsening urinary frequency for the past couple days. Last Ucx grew < 10,000 gram negative. Will treat empirically with Bactrim DS bid x 5 days. Instructed to hold nitrofurantoin while taking Bactrim. Clearly instructed about warning signs. Will tailor treatment according to Ucx results.  -     Sulfamethoxazole-Trimethoprim; Take 1 tablet by mouth 2 (two) times daily for 5 days.  Dispense: 10 tablet; Refill: 0  Return in about 10 days (around 01/13/2023) for Follow up on today's visit with PCP.  Rula Keniston G. Martinique, MD  [facility] [place] office.

## 2023-01-04 ENCOUNTER — Other Ambulatory Visit: Payer: Self-pay | Admitting: Hematology & Oncology

## 2023-01-04 DIAGNOSIS — Z170 Estrogen receptor positive status [ER+]: Secondary | ICD-10-CM

## 2023-01-05 LAB — URINE CULTURE
MICRO NUMBER:: 14584021
SPECIMEN QUALITY:: ADEQUATE

## 2023-01-06 ENCOUNTER — Encounter: Payer: Self-pay | Admitting: Adult Health

## 2023-01-10 ENCOUNTER — Encounter (HOSPITAL_COMMUNITY): Payer: Self-pay | Admitting: Psychiatry

## 2023-01-10 ENCOUNTER — Ambulatory Visit (HOSPITAL_BASED_OUTPATIENT_CLINIC_OR_DEPARTMENT_OTHER): Payer: Medicare HMO | Admitting: Psychiatry

## 2023-01-10 DIAGNOSIS — F3342 Major depressive disorder, recurrent, in full remission: Secondary | ICD-10-CM

## 2023-01-10 DIAGNOSIS — M797 Fibromyalgia: Secondary | ICD-10-CM

## 2023-01-10 MED ORDER — DULOXETINE HCL 60 MG PO CPEP: 60.0000 mg | ORAL_CAPSULE | Freq: Every day | ORAL | 1 refills | Status: DC

## 2023-01-10 NOTE — Progress Notes (Signed)
BH MD/PA/NP OP Progress Note  01/10/2023 1:28 PM Kelle Browder  MRN:  [id number]  Visit Diagnosis:    ICD-10-CM   1. MDD (major depressive disorder), recurrent, in full remission ([place])  F33.42 DULoxetine (CYMBALTA) 60 MG capsule    2. Fibromyalgia  M79.7       Assessment: Jeilany Nitsch is a 82 y.o. female with a history of MDD, anxiety, and fibromyalgia who presented to [place] [facility] for initial evaluation on 08/11/22.    At initial evaluation patient reported a history of MDD but denied any current symptoms other than increased fatigue.  She noted that the increased fatigue seemed to correlate with when the pregabalin was increased for her fibromyalgia.  Patient denies any other psychiatric symptoms including psychosis, paranoia, delusions, SI/HI, thoughts of self-harm, or neurovegetative signs of depression.  Patient reported struggling with chronic pain symptoms as well as migraines with some benefit from the pregabalin.  She denied any benefit from the Topamax.  Treatment options were discussed and it was decided to taper off of Effexor and start Cymbalta for coverage of MDD and fibromyalgia symptoms.     [name] presents for follow-up evaluation. Today, 01/10/23, reports that things well last few months.  He did recently have a UTI for which she was started on antibiotic and is recovering from and left her a bit more fatigued.  Other than that her PCP decreased the Lyrica to once a day and the Topamax was changed to bedtime.  Husband notes that she does seem to be sleeping well at night and prior to the UTI had been a bit less fatigue during the days.  We will continue on her current regimen and follow-up in 6 months.   her moods been pretty good over the past month with some minor anxiety secondary to world events and family in the affected area.  Patient reports no adverse side effects on the Cymbalta and feels that it has helped with her pain  symptoms.  Her husband has noticed some increased fatigue this past month.  We will try switching the Cymbalta to bedtime to see if there is any improvement, if no improvement then she can consider switching the Topamax to bedtime while monitoring to see if migraines get worse again.  Continue on her current regimen and follow-up in 3 months.   Plan: - Continue Cymbalta 60 mg QHS - Discontinued Effexor - Continue Topamax 25 mg QHS managed by PCP to prevent headaches - Lyrica 150 mg decreased to QD managed by PCP, seems to have helped - CBC,CMP, TSH, and UA reviewed - Follow up in 6 months   Chief Complaint:  Chief Complaint  Patient presents with   Follow-up   HPI: Annamary presents alongside her husband.  She reports that things are going well the last few months other than the UTI she had recently.  Patient notes that she was going to the bathroom 10+ times a day and finally started to improve after starting on an antibiotic.  Mood wise patient reports that she was out of it in the midst of the UTI however is recovering now.  Prior to that she reports her mood has been pretty good.  The overall fatigue has been present but her husband notes that there has been some improvement.  We discussed potential causes for it and encouraged patient to continue to keep active during the days.  Past Psychiatric History: Patient has seen Dr. Modesta Messing in the past Psychiatry admission:  denies  Previous suicide attempt: denies Past trials of medication: citalopram, and Effexor.  Patient has also taken Topamax for migraines and pregabalin for small fiber neuropathy.  Past Medical History:  Past Medical History:  Diagnosis Date   Adenomatous colon polyp    Anxiety    PHOBIAS   Arthritis    Breast cancer ([place]) 08/08/13   right LOQ   Cataract    Chronic insomnia    Cluster headaches    history of migraines / NONE FOR SEVERAL YRS   Depression    Diverticulosis    Fatty liver 2011   Fibromyalgia     GERD (gastroesophageal reflux disease)    H/O hiatal hernia    History of transfusion 08/30/2013   Hx of radiation therapy 10/29/13- 12/14/13   right chest wall 5040 cGy 28 sessions, right supraclavicular/axillary region 5040 cGy 28 sessions, right chest wall boost 1000 cGy 5 sessions   Hypothyroidism    Internal hemorrhoids    Irritable bowel syndrome    Kidney stone    Lymphedema    RT ARM - WEARS SLEEVE   Macular degeneration    hole/right eye   MDD (major depressive disorder)    Osteopenia    Other abnormal glucose    Other and unspecified hyperlipidemia    Pain in joint, shoulder region    Pneumonia [id number]   Sleep apnea    USES C-PAP   Stress incontinence, female     Past Surgical History:  Procedure Laterality Date   ABDOMINAL HYSTERECTOMY     APPENDECTOMY     BILATERAL TOTAL MASTECTOMY WITH AXILLARY LYMPH NODE DISSECTION  08/30/2013   Dr Barry Dienes   BREAST CYST ASPIRATION     9 cysts   CATARACT EXTRACTION, BILATERAL  2005/2007   CHOLECYSTECTOMY     COLONOSCOPY     CYSTOCELE REPAIR     EVACUATION BREAST HEMATOMA Left 08/31/2013   Procedure: EVACUATION HEMATOMA BREAST;  Surgeon: Stark Klein, MD;  Location: [facility];  Service: General;  Laterality: Left;   EYE SURGERY     to repair macular hole   FOOT ARTHROPLASTY     lt    GANGLION CYST EXCISION     rt foot   HEMORRHOID SURGERY     03/1993   JOINT REPLACEMENT  03/15/11   left knee replacement   KNEE ARTHROSCOPY     /partial knee 2016/left knee 2012   MASS EXCISION  11/04/2011   Procedure: EXCISION MASS;  Surgeon: Cammie Sickle., MD;  Location: [facility];  Service: Orthopedics;  Laterality: Right;  excisional biopsy right ulna mass   MASTECTOMY W/ SENTINEL NODE BIOPSY Right 08/30/2013   Procedure: RIGHT  AXILLARY SENTINEL LYMPH NODE BIOPSY; Right Axillary Node Disection;  Surgeon: Stark Klein, MD;  Location: [facility];  Service: General;  Laterality: Right;  Right side nuc med 7:00    PARTIAL  KNEE ARTHROPLASTY Right 11/03/2015   Procedure: RIGHT KNEE MEDIAL UNICOMPARTMENTAL ARTHROPLASTY ;  Surgeon: Gaynelle Arabian, MD;  Location: [facility];  Service: Orthopedics;  Laterality: Right;   RECTOCELE REPAIR     SIMPLE MASTECTOMY WITH AXILLARY SENTINEL NODE BIOPSY Left 08/30/2013   Procedure: Bilateral Breast Mastectomy ;  Surgeon: Stark Klein, MD;  Location: [facility];  Service: General;  Laterality: Left;   skin tags removed     breast, panty line, neckline   TOE SURGERY     preventative crossover toe surg/right foot   TOE SURGERY  2009  left foot/screw  in 2nd toe   TONSILLECTOMY     UPPER GASTROINTESTINAL ENDOSCOPY      Family Psychiatric History: Denies  Family History:  Family History  Problem Relation Age of Onset   Stroke Mother        died age 44   Diabetes Mother    Breast cancer Mother 73   Breast cancer Sister 17   Breast cancer Paternal Aunt 56   Diabetes Maternal Grandfather    Breast cancer Paternal Grandmother 36   Breast cancer Paternal Aunt        dx in her 68s   Cancer Maternal Grandmother        intra-abdominal cancer   Brain cancer Maternal Uncle 8   Brain cancer Cousin 48       maternal cousin   Brain cancer Cousin 70       paternal cousin   Colon cancer Neg Hx     Social History:  Social History   Socioeconomic History   Marital status: Married    Spouse name: Fritz Pickerel   Number of children: 3   Years of education: Not on file   Highest education level: GED or equivalent  Occupational History   Occupation: retired Radiation protection practitioner  Tobacco Use   Smoking status: Former    Packs/day: 0.10    Years: 2.00    Total pack years: 0.20    Types: Cigarettes    Start date: 11/16/1959    Quit date: 11/15/1960    Years since quitting: 62.1    Passive exposure: Past (over 78 years ago)   Smokeless tobacco: Never  Vaping Use   Vaping Use: Never used  Substance and Sexual Activity   Alcohol use: Yes    Comment: rarely   Drug use: No   Sexual activity: Yes     Comment: menarche age 21, fist live birth 70, P 3, hysterectomy age 70, no HRT, BCP 2 yrs  Other Topics Concern   Not on file  Social History Narrative   Occupation:  Retired Radiation protection practitioner    Married with 3 grown children      Never Smoked     Alcohol use-no        Social Determinants of Health   Financial Resource Strain: Low Risk  (10/24/2022)   Overall Financial Resource Strain (CARDIA)    Difficulty of Paying Living Expenses: Not hard at all  Food Insecurity: No Food Insecurity (10/24/2022)   Hunger Vital Sign    Worried About Running Out of Food in the Last Year: Never true    Ran Out of Food in the Last Year: Never true  Transportation Needs: No Transportation Needs (10/24/2022)   PRAPARE - Hydrologist (Medical): No    Lack of Transportation (Non-Medical): No  Physical Activity: Unknown (10/24/2022)   Exercise Vital Sign    Days of Exercise per Week: 0 days    Minutes of Exercise per Session: Not on file  Stress: Stress Concern Present (10/24/2022)   [place]    Feeling of Stress : Rather much  Social Connections: Unknown (10/24/2022)   Social Connection and Isolation Panel [NHANES]    Frequency of Communication with Friends and Family: More than three times a week    Frequency of Social Gatherings with Friends and Family: Twice a week    Attends Religious Services: Not on Diplomatic Services operational officer of Clubs or Organizations: Yes  Attends Archivist Meetings: 1 to 4 times per year    Marital Status: Married    Allergies: No Known Allergies  Current Medications: Current Outpatient Medications  Medication Sig Dispense Refill   Bacillus Coagulans-Inulin (PROBIOTIC) 1-250 BILLION-MG CAPS Take by mouth.     butalbital-acetaminophen-caffeine (FIORICET) 50-325-40 MG tablet Take 1 tablet by mouth as needed for headache. 30 tablet 2   Calcium Carbonate-Vitamin D  (CALCIUM-VITAMIN D3 PO) Take 1 tablet by mouth in the morning and at bedtime.     Cholecalciferol (VITAMIN D3) 50 MCG (2000 UT) TABS Take 2 tablets by mouth daily.     diazepam (VALIUM) 5 MG tablet Take 5 mg by mouth every 6 (six) hours as needed for anxiety. As needed (flying & dental)     DULoxetine (CYMBALTA) 60 MG capsule Take 1 capsule (60 mg total) by mouth daily. 90 capsule 1   esomeprazole (NEXIUM) 40 MG capsule Take 1 capsule (40 mg total) by mouth daily at 12 noon. (Patient taking differently: Take 40 mg by mouth daily before breakfast.) 365 capsule 0   famotidine (PEPCID) 40 MG tablet TAKE ONE TABLET BY MOUTH EVERY NIGHT AT BEDTIME 90 tablet 0   levothyroxine (SYNTHROID) 100 MCG tablet TAKE ONE TABLET BY MOUTH DAILY 365 tablet 0   mirabegron ER (MYRBETRIQ) 50 MG TB24 tablet Take 1 tablet (50 mg total) by mouth daily. 30 tablet 0   montelukast (SINGULAIR) 10 MG tablet TAKE 1 TABLET BY MOUTH AT BEDTIME 90 tablet 1   nitrofurantoin, macrocrystal-monohydrate, (MACROBID) 100 MG capsule Take 1 capsule (100 mg total) by mouth at bedtime. 365 capsule 0   omega-3 acid ethyl esters (LOVAZA) 1 g capsule TAKE ONE CAPSULE BY MOUTH TWICE A DAY 180 capsule 3   Polyethylene Glycol 3350 (MIRALAX PO) Take by mouth. 1/2 capful every morning     pregabalin (LYRICA) 150 MG capsule Take 1 capsule (150 mg total) by mouth 2 (two) times daily. 180 capsule 0   rosuvastatin (CRESTOR) 20 MG tablet Take 1 tablet (20 mg total) by mouth daily. 365 tablet 0   topiramate (TOPAMAX) 25 MG tablet TAKE ONE TABLET BY MOUTH DAILY 325 tablet 0   No current facility-administered medications for this visit.     Musculoskeletal: Strength & Muscle Tone: within normal limits Gait & Station: normal, unsteady Patient leans: N/A  Psychiatric Specialty Exam: Review of Systems  There were no vitals taken for this visit.There is no height or weight on file to calculate BMI.  General Appearance: Well Groomed  Eye Contact:  Good   Speech:  Clear and Coherent and Normal Rate  Volume:  Normal  Mood:  Anxious and Euthymic  Affect:  Congruent  Thought Process:  Coherent and Goal Directed  Orientation:  Full (Time, Place, and Person)  Thought Content: Logical   Suicidal Thoughts:  No  Homicidal Thoughts:  No  Memory:  NA  Judgement:  Good  Insight:  Good  Psychomotor Activity:  Normal  Concentration:  Concentration: Good  Recall:  [place] of Knowledge: Fair  Language: Good  Akathisia:  NA    AIMS (if indicated): not done  Assets:  Communication Skills Desire for Improvement Housing Intimacy Leisure Time Resilience Social Support  ADL's:  Intact  Cognition: WNL  Sleep:  Good   Metabolic Disorder Labs: Lab Results  Component Value Date   HGBA1C 6.2 05/25/2022   MPG 126 (H) 08/27/2013   MPG 117 (H) 06/22/2013   Lab Results  Component  Value Date   PROLACTIN 3.9 07/22/2009   Lab Results  Component Value Date   CHOL 202 (H) 05/25/2022   TRIG 80.0 05/25/2022   HDL 52.80 05/25/2022   CHOLHDL 4 05/25/2022   VLDL 16.0 05/25/2022   LDLCALC 133 (H) 05/25/2022   LDLCALC 138 (H) 03/06/2021   Lab Results  Component Value Date   TSH 1.89 05/25/2022   TSH 0.62 03/03/2022    Therapeutic Level Labs: No results found for: "LITHIUM" No results found for: "VALPROATE" No results found for: "CBMZ"   Screenings: GAD-7    Flowsheet Row Office Visit from 01/03/2023 in [place] at [place]  Total GAD-7 Score [place] Office Visit from 10/27/2022 in [place] at [place]  Total Score (max 30 points ) 29      PHQ2-9    [place] Visit from 01/03/2023 in [place] at [place] Video Visit from 11/01/2022 in [place] at [place] from 08/11/2022 in [facility] Office Visit from 06/30/2022 in [place] at [place] from 03/18/2022 in [place] at [place]  PHQ-2 Total Score 4 0 0 3 0  PHQ-9 Total Score 15 4 -- 11 [place] Visit from 08/11/2022 in [facility] ED from 08/08/2022 in [facility] Emergency Department at [facility] ED from 03/15/2022 in [facility] Emergency Department at [place] No Risk No Risk No Risk       Collaboration of Care: Collaboration of Care: Medication Management AEB medication prescription  Patient/Guardian was advised Release of Information must be obtained prior to any record release in order to collaborate their care with an outside provider. Patient/Guardian was advised if they have not already done so to contact the registration department to sign all necessary forms in order for [name] to release information regarding their care.   Consent: Patient/Guardian gives verbal consent for treatment and assignment of benefits for services provided during this visit. Patient/Guardian expressed understanding and agreed to proceed.    Vista Mink, MD 01/10/2023, 1:28 PM

## 2023-01-13 ENCOUNTER — Ambulatory Visit (INDEPENDENT_AMBULATORY_CARE_PROVIDER_SITE_OTHER): Payer: Medicare HMO | Admitting: Adult Health

## 2023-01-13 ENCOUNTER — Encounter: Payer: Self-pay | Admitting: Adult Health

## 2023-01-13 VITALS — BP 120/80 | HR 90 | Temp 97.9°F | Ht 59.0 in | Wt 175.0 lb

## 2023-01-13 DIAGNOSIS — N3000 Acute cystitis without hematuria: Secondary | ICD-10-CM | POA: Diagnosis not present

## 2023-01-13 DIAGNOSIS — R5382 Chronic fatigue, unspecified: Secondary | ICD-10-CM

## 2023-01-13 DIAGNOSIS — R350 Frequency of micturition: Secondary | ICD-10-CM

## 2023-01-13 LAB — URINALYSIS
Bilirubin Urine: NEGATIVE
Hgb urine dipstick: NEGATIVE
Leukocytes,Ua: NEGATIVE
Nitrite: NEGATIVE
Specific Gravity, Urine: 1.02 (ref 1.000–1.030)
Total Protein, Urine: NEGATIVE
Urine Glucose: NEGATIVE
Urobilinogen, UA: 0.2 (ref 0.0–1.0)
pH: 7 (ref 5.0–8.0)

## 2023-01-13 NOTE — Progress Notes (Signed)
Subjective:    [name], female    DOB: October 05, 1941, 82 y.o.   MRN: [id number]  HPI 82 year old female who  has a past medical history of Adenomatous colon polyp, Anxiety, Arthritis, Breast cancer ([place]) (08/08/13), Cataract, Chronic insomnia, Cluster headaches, Depression, Diverticulosis, Fatty liver (2011), Fibromyalgia, GERD (gastroesophageal reflux disease), H/O hiatal hernia, History of transfusion (08/30/2013), radiation therapy (10/29/13- 12/14/13), Hypothyroidism, Internal hemorrhoids, Irritable bowel syndrome, Kidney stone, Lymphedema, Macular degeneration, MDD (major depressive disorder), Osteopenia, Other abnormal glucose, Other and unspecified hyperlipidemia, Pain in joint, shoulder region, Pneumonia [id number]), Sleep apnea, and Stress incontinence, female.  She presents to the office today for 10-day follow-up regarding a UTI.  She was treated by another provider with Bactrim for her urinary tract infection. Her urine culture showed Proteus mirabilis that was resistant to Macrobid that she is on for daily prevention.  She denies fevers, chills, hematuria, or dysuria.  Her husband who is with her today reports that she has not been doing much in the way of exercise   Review of Systems See HPI   Past Medical History:  Diagnosis Date   Adenomatous colon polyp    Anxiety    PHOBIAS   Arthritis    Breast cancer ([place]) 08/08/13   right LOQ   Cataract    Chronic insomnia    Cluster headaches    history of migraines / NONE FOR SEVERAL YRS   Depression    Diverticulosis    Fatty liver 2011   Fibromyalgia    GERD (gastroesophageal reflux disease)    H/O hiatal hernia    History of transfusion 08/30/2013   Hx of radiation therapy 10/29/13- 12/14/13   right chest wall 5040 cGy 28 sessions, right supraclavicular/axillary region 5040 cGy 28 sessions, right chest wall boost 1000 cGy 5 sessions   Hypothyroidism    Internal hemorrhoids    Irritable bowel syndrome     Kidney stone    Lymphedema    RT ARM - WEARS SLEEVE   Macular degeneration    hole/right eye   MDD (major depressive disorder)    Osteopenia    Other abnormal glucose    Other and unspecified hyperlipidemia    Pain in joint, shoulder region    Pneumonia [id number]   Sleep apnea    USES C-PAP   Stress incontinence, female     Social History   Socioeconomic History   Marital status: Married    Spouse name: Fritz Pickerel   Number of children: 3   Years of education: Not on file   Highest education level: GED or equivalent  Occupational History   Occupation: retired Radiation protection practitioner  Tobacco Use   Smoking status: Former    Packs/day: 0.10    Years: 2.00    Total pack years: 0.20    Types: Cigarettes    Start date: 11/16/1959    Quit date: 11/15/1960    Years since quitting: 62.2    Passive exposure: Past (over 66 years ago)   Smokeless tobacco: Never  Vaping Use   Vaping Use: Never used  Substance and Sexual Activity   Alcohol use: Yes    Comment: rarely   Drug use: No   Sexual activity: Yes    Comment: menarche age 86, fist live birth 24, P 3, hysterectomy age 46, no HRT, BCP 2 yrs  Other Topics Concern   Not on file  Social History Narrative   Occupation:  Retired Radiation protection practitioner    Married with 3  grown children      Never Smoked     Alcohol use-no        Social Determinants of Health   Financial Resource Strain: Low Risk  (10/24/2022)   Overall Financial Resource Strain (CARDIA)    Difficulty of Paying Living Expenses: Not hard at all  Food Insecurity: No Food Insecurity (10/24/2022)   Hunger Vital Sign    Worried About Running Out of Food in the Last Year: Never true    Ran Out of Food in the Last Year: Never true  Transportation Needs: No Transportation Needs (10/24/2022)   PRAPARE - Hydrologist (Medical): No    Lack of Transportation (Non-Medical): No  Physical Activity: Unknown (10/24/2022)   Exercise Vital Sign    Days of Exercise per Week:  0 days    Minutes of Exercise per Session: Not on file  Stress: Stress Concern Present (10/24/2022)   [place]    Feeling of Stress : Rather much  Social Connections: Unknown (10/24/2022)   Social Connection and Isolation Panel [NHANES]    Frequency of Communication with Friends and Family: More than three times a week    Frequency of Social Gatherings with Friends and Family: Twice a week    Attends Religious Services: Not on file    Active Member of Clubs or Organizations: Yes    Attends Archivist Meetings: 1 to 4 times per year    Marital Status: Married  Human resources officer Violence: Not At Risk (07/22/2021)   Humiliation, Afraid, Rape, and Kick questionnaire    Fear of Current or Ex-Partner: No    Emotionally Abused: No    Physically Abused: No    Sexually Abused: No    Past Surgical History:  Procedure Laterality Date   ABDOMINAL HYSTERECTOMY     APPENDECTOMY     BILATERAL TOTAL MASTECTOMY WITH AXILLARY LYMPH NODE DISSECTION  08/30/2013   Dr Barry Dienes   BREAST CYST ASPIRATION     9 cysts   CATARACT EXTRACTION, BILATERAL  2005/2007   CHOLECYSTECTOMY     COLONOSCOPY     CYSTOCELE REPAIR     EVACUATION BREAST HEMATOMA Left 08/31/2013   Procedure: EVACUATION HEMATOMA BREAST;  Surgeon: Stark Klein, MD;  Location: [facility];  Service: General;  Laterality: Left;   EYE SURGERY     to repair macular hole   FOOT ARTHROPLASTY     lt    GANGLION CYST EXCISION     rt foot   HEMORRHOID SURGERY     03/1993   JOINT REPLACEMENT  03/15/11   left knee replacement   KNEE ARTHROSCOPY     /partial knee 2016/left knee 2012   MASS EXCISION  11/04/2011   Procedure: EXCISION MASS;  Surgeon: Cammie Sickle., MD;  Location: [facility];  Service: Orthopedics;  Laterality: Right;  excisional biopsy right ulna mass   MASTECTOMY W/ SENTINEL NODE BIOPSY Right 08/30/2013   Procedure: RIGHT  AXILLARY SENTINEL  LYMPH NODE BIOPSY; Right Axillary Node Disection;  Surgeon: Stark Klein, MD;  Location: [facility];  Service: General;  Laterality: Right;  Right side nuc med 7:00    PARTIAL KNEE ARTHROPLASTY Right 11/03/2015   Procedure: RIGHT KNEE MEDIAL UNICOMPARTMENTAL ARTHROPLASTY ;  Surgeon: Gaynelle Arabian, MD;  Location: [facility];  Service: Orthopedics;  Laterality: Right;   RECTOCELE REPAIR     SIMPLE MASTECTOMY WITH AXILLARY SENTINEL NODE BIOPSY Left 08/30/2013  Procedure: Bilateral Breast Mastectomy ;  Surgeon: Stark Klein, MD;  Location: [facility];  Service: General;  Laterality: Left;   skin tags removed     breast, panty line, neckline   TOE SURGERY     preventative crossover toe surg/right foot   TOE SURGERY  2009   left foot/screw  in 2nd toe   TONSILLECTOMY     UPPER GASTROINTESTINAL ENDOSCOPY      Family History  Problem Relation Age of Onset   Stroke Mother        died age 74   Diabetes Mother    Breast cancer Mother 68   Breast cancer Sister 13   Breast cancer Paternal Aunt 30   Diabetes Maternal Grandfather    Breast cancer Paternal Grandmother 66   Breast cancer Paternal Aunt        dx in her 36s   Cancer Maternal Grandmother        intra-abdominal cancer   Brain cancer Maternal Uncle 8   Brain cancer Cousin 41       maternal cousin   Brain cancer Cousin 3       paternal cousin   Colon cancer Neg Hx     No Known Allergies  Current Outpatient Medications on File Prior to Visit  Medication Sig Dispense Refill   Bacillus Coagulans-Inulin (PROBIOTIC) 1-250 BILLION-MG CAPS Take by mouth.     butalbital-acetaminophen-caffeine (FIORICET) 50-325-40 MG tablet Take 1 tablet by mouth as needed for headache. 30 tablet 2   Calcium Carbonate-Vitamin D (CALCIUM-VITAMIN D3 PO) Take 1 tablet by mouth in the morning and at bedtime.     Cholecalciferol (VITAMIN D3) 50 MCG (2000 UT) TABS Take 2 tablets by mouth daily.     diazepam (VALIUM) 5 MG tablet Take 5 mg by mouth every 6 (six) hours as  needed for anxiety. As needed (flying & dental)     DULoxetine (CYMBALTA) 60 MG capsule Take 1 capsule (60 mg total) by mouth daily. 90 capsule 1   esomeprazole (NEXIUM) 40 MG capsule Take 1 capsule (40 mg total) by mouth daily at 12 noon. (Patient taking differently: Take 40 mg by mouth daily before breakfast.) 365 capsule 0   famotidine (PEPCID) 40 MG tablet TAKE ONE TABLET BY MOUTH EVERY NIGHT AT BEDTIME 90 tablet 0   levothyroxine (SYNTHROID) 100 MCG tablet TAKE ONE TABLET BY MOUTH DAILY 365 tablet 0   mirabegron ER (MYRBETRIQ) 50 MG TB24 tablet Take 1 tablet (50 mg total) by mouth daily. 30 tablet 0   montelukast (SINGULAIR) 10 MG tablet TAKE 1 TABLET BY MOUTH AT BEDTIME 90 tablet 1   nitrofurantoin, macrocrystal-monohydrate, (MACROBID) 100 MG capsule Take 1 capsule (100 mg total) by mouth at bedtime. 365 capsule 0   omega-3 acid ethyl esters (LOVAZA) 1 g capsule TAKE ONE CAPSULE BY MOUTH TWICE A DAY 180 capsule 3   Polyethylene Glycol 3350 (MIRALAX PO) Take by mouth. 1/2 capful every morning     rosuvastatin (CRESTOR) 20 MG tablet Take 1 tablet (20 mg total) by mouth daily. 365 tablet 0   topiramate (TOPAMAX) 25 MG tablet TAKE ONE TABLET BY MOUTH DAILY 325 tablet 0   pregabalin (LYRICA) 150 MG capsule Take 1 capsule (150 mg total) by mouth 2 (two) times daily. 180 capsule 0   No current facility-administered medications on file prior to visit.    BP 120/80   Pulse 90   Temp 97.9 F (36.6 C) (Oral)   Ht '4\' 11"'$  (1.499  m)   Wt 175 lb (79.4 kg)   SpO2 96%   BMI 35.35 kg/m       Objective:   Physical Exam Vitals and nursing note reviewed.  Constitutional:      Appearance: Normal appearance.  Cardiovascular:     Rate and Rhythm: Normal rate and regular rhythm.     Pulses: Normal pulses.     Heart sounds: Normal heart sounds.  Pulmonary:     Effort: Pulmonary effort is normal.     Breath sounds: Normal breath sounds.  Skin:    General: Skin is warm and dry.  Neurological:      General: No focal deficit present.     Mental Status: She is alert and oriented to person, place, and time.  Psychiatric:        Mood and Affect: Mood normal.        Behavior: Behavior normal.        Thought Content: Thought content normal.           Assessment & Plan:  1. Acute cystitis without hematuria - Will recheck UA and urine culture for patient  - Urinalysis; Future - Urine Culture; Future - Urine Culture - Urinalysis  2. Frequent urination - Will likely improve since she no longer has a UTI  3. Chronic fatigue - Encouragd exercise and to get out of the house throughout the day   [name], NP

## 2023-01-14 LAB — URINE CULTURE
MICRO NUMBER:: 14632202
SPECIMEN QUALITY:: ADEQUATE

## 2023-01-17 ENCOUNTER — Other Ambulatory Visit: Payer: Self-pay | Admitting: *Deleted

## 2023-01-17 MED ORDER — FAMOTIDINE 40 MG PO TABS: 40.0000 mg | ORAL_TABLET | Freq: Every day | ORAL | 0 refills | Status: DC

## 2023-01-20 ENCOUNTER — Ambulatory Visit (HOSPITAL_COMMUNITY)
Admission: RE | Admit: 2023-01-20 | Discharge: 2023-01-20 | Disposition: A | Discharge status: Home / Self Care | Payer: Medicare HMO | Source: Ambulatory Visit | Attending: Hematology & Oncology | Admitting: Hematology & Oncology

## 2023-01-20 DIAGNOSIS — C50511 Malignant neoplasm of lower-outer quadrant of right female breast: Secondary | ICD-10-CM | POA: Insufficient documentation

## 2023-01-20 DIAGNOSIS — Z170 Estrogen receptor positive status [ER+]: Secondary | ICD-10-CM | POA: Diagnosis present

## 2023-01-20 MED ORDER — IOHEXOL 300 MG/ML  SOLN
100.0000 mL | Freq: Once | INTRAMUSCULAR | Status: AC | PRN
  Administered 2023-01-20: 100 mL via INTRAVENOUS

## 2023-01-28 ENCOUNTER — Telehealth: Payer: Self-pay

## 2023-01-28 NOTE — Telephone Encounter (Signed)
Informed patient Jean Nash would like patient scheduled for colonoscopy due to abnormal CT scan. Patient scheduled for pre-visit on 01/31/20 and and colonoscopy on 02/03/23.

## 2023-01-28 NOTE — Telephone Encounter (Signed)
Message Received: Micheline Rough, Pricilla Riffle, MD  Volanda Napoleon, MD; Lindon Romp, CMA Laurey Arrow, I reviewed the CT. We will set her up for colonoscopy. Thanks, Norberto Sorenson       Previous Messages    ----- Message ----- From: Volanda Napoleon, MD Sent: 01/27/2023   2:46 PM EDT To: Ladene Artist, MD  Malcolm:  this patient is known to you, but no colonoscopy for 10 yrs.  I did a screening CT scan on her because of her breast cancer.  The radiologist says there is thickening in the colon at the hepatic flexure.  As such, I think we will going to have to take a look.  She is still in good health.  If you can get her set up with a colonoscopy that would be fantastic just to make sure that we are not overlooking a colonic neoplasm.

## 2023-01-31 ENCOUNTER — Telehealth: Payer: Self-pay

## 2023-01-31 NOTE — Telephone Encounter (Signed)
Mutiple attempts made to complete PV. Unable to reach patient. VM left. Pt to call the office back by 5 PM to have her PV and her colonoscopy rescheduled. Pt procedure is 3/21 currently will need a new procedure date.

## 2023-01-31 NOTE — Telephone Encounter (Signed)
Hey Dr. Fuller Plan just wanted to let you know Jean Nash was a no show for her PV today so I am cancelling her colonoscopy for Thursday 3/21. Thank you.

## 2023-02-01 ENCOUNTER — Telehealth: Payer: Self-pay | Admitting: Adult Health

## 2023-02-01 ENCOUNTER — Encounter: Payer: Self-pay | Admitting: Gastroenterology

## 2023-02-01 ENCOUNTER — Ambulatory Visit: Payer: Medicare HMO | Admitting: Rheumatology

## 2023-02-01 NOTE — Telephone Encounter (Signed)
Contacted Jean Nash to schedule their annual wellness visit. Appointment made for 02/09/23.  [name] AWV direct phone # [phone]*

## 2023-02-03 ENCOUNTER — Ambulatory Visit (INDEPENDENT_AMBULATORY_CARE_PROVIDER_SITE_OTHER): Payer: Medicare HMO | Admitting: Adult Health

## 2023-02-03 ENCOUNTER — Encounter: Payer: Medicare HMO | Admitting: Gastroenterology

## 2023-02-03 ENCOUNTER — Encounter: Payer: Self-pay | Admitting: Adult Health

## 2023-02-03 VITALS — BP 120/72 | HR 88 | Temp 97.9°F | Ht 59.0 in | Wt 175.0 lb

## 2023-02-03 DIAGNOSIS — R4189 Other symptoms and signs involving cognitive functions and awareness: Secondary | ICD-10-CM | POA: Diagnosis not present

## 2023-02-03 NOTE — Addendum Note (Signed)
Addended by: Rosalyn Gess D on: 02/03/2023 04:19 PM   Modules accepted: Orders

## 2023-02-03 NOTE — Progress Notes (Signed)
Subjective:    [name], female    DOB: 18-Nov-1940, 82 y.o.   MRN: [id number]  HPI 83 year old female who  has a past medical history of Adenomatous colon polyp, Anxiety, Arthritis, Breast cancer ([place]) (08/08/13), Cataract, Chronic insomnia, Cluster headaches, Depression, Diverticulosis, Fatty liver (2011), Fibromyalgia, GERD (gastroesophageal reflux disease), H/O hiatal hernia, History of transfusion (08/30/2013), radiation therapy (10/29/13- 12/14/13), Hypothyroidism, Internal hemorrhoids, Irritable bowel syndrome, Kidney stone, Lymphedema, Macular degeneration, MDD (major depressive disorder), Osteopenia, Other abnormal glucose, Other and unspecified hyperlipidemia, Pain in joint, shoulder region, Pneumonia [id number]), Sleep apnea, and Stress incontinence, female.  She presents to the office today for short-term memory loss.  She was evaluated for this back in December 2023 at which time she was having trouble remembering conversations or tasks that she needed to have done.  Her husband reported that he would have to repeat himself multiple times and she would asked the same question over.  PHQ Scott 9 score was 29 out of 30.  We decided not to do an MRI at this time.  Today she reports that her memory did get better but then over the last few weeks had more short-term memory loss has come back again. Her husband reports that she will ask the same questions over and over. She will forget appointments or why she has appointments. She will often write down her appointments but cannot remember why she wrote them down.   She spends most of the day at home inside.   She does have a history of chronic UTI's and is on daily Macrobid therapy.    Review of Systems See HPI   Past Medical History:  Diagnosis Date   Adenomatous colon polyp    Anxiety    PHOBIAS   Arthritis    Breast cancer ([place]) 08/08/13   right LOQ   Cataract    Chronic insomnia    Cluster headaches    history  of migraines / NONE FOR SEVERAL YRS   Depression    Diverticulosis    Fatty liver 2011   Fibromyalgia    GERD (gastroesophageal reflux disease)    H/O hiatal hernia    History of transfusion 08/30/2013   Hx of radiation therapy 10/29/13- 12/14/13   right chest wall 5040 cGy 28 sessions, right supraclavicular/axillary region 5040 cGy 28 sessions, right chest wall boost 1000 cGy 5 sessions   Hypothyroidism    Internal hemorrhoids    Irritable bowel syndrome    Kidney stone    Lymphedema    RT ARM - WEARS SLEEVE   Macular degeneration    hole/right eye   MDD (major depressive disorder)    Osteopenia    Other abnormal glucose    Other and unspecified hyperlipidemia    Pain in joint, shoulder region    Pneumonia [id number]   Sleep apnea    USES C-PAP   Stress incontinence, female     Social History   Socioeconomic History   Marital status: Married    Spouse name: Fritz Pickerel   Number of children: 3   Years of education: Not on file   Highest education level: GED or equivalent  Occupational History   Occupation: retired Radiation protection practitioner  Tobacco Use   Smoking status: Former    Packs/day: 0.10    Years: 2.00    Additional pack years: 0.00    Total pack years: 0.20    Types: Cigarettes    Start date: 11/16/1959    Quit  date: 11/15/1960    Years since quitting: 62.2    Passive exposure: Past (over 32 years ago)   Smokeless tobacco: Never  Vaping Use   Vaping Use: Never used  Substance and Sexual Activity   Alcohol use: Yes    Comment: rarely   Drug use: No   Sexual activity: Yes    Comment: menarche age 75, fist live birth 28, P 3, hysterectomy age 39, no HRT, BCP 2 yrs  Other Topics Concern   Not on file  Social History Narrative   Occupation:  Retired Radiation protection practitioner    Married with 3 grown children      Never Smoked     Alcohol use-no        Social Determinants of Radio broadcast assistant Strain: Low Risk  (10/24/2022)   Overall Financial Resource Strain (CARDIA)     Difficulty of Paying Living Expenses: Not hard at all  Food Insecurity: No Food Insecurity (10/24/2022)   Hunger Vital Sign    Worried About Running Out of Food in the Last Year: Never true    Ran Out of Food in the Last Year: Never true  Transportation Needs: No Transportation Needs (10/24/2022)   PRAPARE - Hydrologist (Medical): No    Lack of Transportation (Non-Medical): No  Physical Activity: Unknown (10/24/2022)   Exercise Vital Sign    Days of Exercise per Week: 0 days    Minutes of Exercise per Session: Not on file  Stress: Stress Concern Present (10/24/2022)   [place]    Feeling of Stress : Rather much  Social Connections: Unknown (10/24/2022)   Social Connection and Isolation Panel [NHANES]    Frequency of Communication with Friends and Family: More than three times a week    Frequency of Social Gatherings with Friends and Family: Twice a week    Attends Religious Services: Not on file    Active Member of Clubs or Organizations: Yes    Attends Archivist Meetings: 1 to 4 times per year    Marital Status: Married  Human resources officer Violence: Not At Risk (07/22/2021)   Humiliation, Afraid, Rape, and Kick questionnaire    Fear of Current or Ex-Partner: No    Emotionally Abused: No    Physically Abused: No    Sexually Abused: No    Past Surgical History:  Procedure Laterality Date   ABDOMINAL HYSTERECTOMY     APPENDECTOMY     BILATERAL TOTAL MASTECTOMY WITH AXILLARY LYMPH NODE DISSECTION  08/30/2013   Dr Barry Dienes   BREAST CYST ASPIRATION     9 cysts   CATARACT EXTRACTION, BILATERAL  2005/2007   CHOLECYSTECTOMY     COLONOSCOPY     CYSTOCELE REPAIR     EVACUATION BREAST HEMATOMA Left 08/31/2013   Procedure: EVACUATION HEMATOMA BREAST;  Surgeon: Stark Klein, MD;  Location: [facility];  Service: General;  Laterality: Left;   EYE SURGERY     to repair macular hole   FOOT  ARTHROPLASTY     lt    GANGLION CYST EXCISION     rt foot   HEMORRHOID SURGERY     03/1993   JOINT REPLACEMENT  03/15/11   left knee replacement   KNEE ARTHROSCOPY     /partial knee 2016/left knee 2012   MASS EXCISION  11/04/2011   Procedure: EXCISION MASS;  Surgeon: Cammie Sickle., MD;  Location: [facility];  Service: Orthopedics;  Laterality: Right;  excisional biopsy right ulna mass   MASTECTOMY W/ SENTINEL NODE BIOPSY Right 08/30/2013   Procedure: RIGHT  AXILLARY SENTINEL LYMPH NODE BIOPSY; Right Axillary Node Disection;  Surgeon: Stark Klein, MD;  Location: [facility];  Service: General;  Laterality: Right;  Right side nuc med 7:00    PARTIAL KNEE ARTHROPLASTY Right 11/03/2015   Procedure: RIGHT KNEE MEDIAL UNICOMPARTMENTAL ARTHROPLASTY ;  Surgeon: Gaynelle Arabian, MD;  [facility];  Service: Orthopedics;  Laterality: Right;   RECTOCELE REPAIR     SIMPLE MASTECTOMY WITH AXILLARY SENTINEL NODE BIOPSY Left 08/30/2013   Procedure: Bilateral Breast Mastectomy ;  Surgeon: Stark Klein, MD;  Location: [facility];  Service: General;  Laterality: Left;   skin tags removed     breast, panty line, neckline   TOE SURGERY     preventative crossover toe surg/right foot   TOE SURGERY  2009   left foot/screw  in 2nd toe   TONSILLECTOMY     UPPER GASTROINTESTINAL ENDOSCOPY      Family History  Problem Relation Age of Onset   Stroke Mother        died age 62   Diabetes Mother    Breast cancer Mother 43   Breast cancer Sister 44   Breast cancer Paternal Aunt 72   Diabetes Maternal Grandfather    Breast cancer Paternal Grandmother 57   Breast cancer Paternal Aunt        dx in her 44s   Cancer Maternal Grandmother        intra-abdominal cancer   Brain cancer Maternal Uncle 8   Brain cancer Cousin 15       maternal cousin   Brain cancer Cousin 68       paternal cousin   Colon cancer Neg Hx     No Known Allergies  Current Outpatient Medications on File Prior to Visit   Medication Sig Dispense Refill   Bacillus Coagulans-Inulin (PROBIOTIC) 1-250 BILLION-MG CAPS Take by mouth.     butalbital-acetaminophen-caffeine (FIORICET) 50-325-40 MG tablet Take 1 tablet by mouth as needed for headache. 30 tablet 2   Calcium Carbonate-Vitamin D (CALCIUM-VITAMIN D3 PO) Take 1 tablet by mouth in the morning and at bedtime.     Cholecalciferol (VITAMIN D3) 50 MCG (2000 UT) TABS Take 2 tablets by mouth daily.     diazepam (VALIUM) 5 MG tablet Take 5 mg by mouth every 6 (six) hours as needed for anxiety. As needed (flying & dental)     DULoxetine (CYMBALTA) 60 MG capsule Take 1 capsule (60 mg total) by mouth daily. 90 capsule 1   esomeprazole (NEXIUM) 40 MG capsule Take 1 capsule (40 mg total) by mouth daily at 12 noon. (Patient taking differently: Take 40 mg by mouth daily before breakfast.) 365 capsule 0   famotidine (PEPCID) 40 MG tablet Take 1 tablet (40 mg total) by mouth at bedtime. 90 tablet 0   levothyroxine (SYNTHROID) 100 MCG tablet TAKE ONE TABLET BY MOUTH DAILY 365 tablet 0   mirabegron ER (MYRBETRIQ) 50 MG TB24 tablet Take 1 tablet (50 mg total) by mouth daily. 30 tablet 0   montelukast (SINGULAIR) 10 MG tablet TAKE 1 TABLET BY MOUTH AT BEDTIME 90 tablet 1   nitrofurantoin, macrocrystal-monohydrate, (MACROBID) 100 MG capsule Take 1 capsule (100 mg total) by mouth at bedtime. 365 capsule 0   omega-3 acid ethyl esters (LOVAZA) 1 g capsule TAKE ONE CAPSULE BY MOUTH TWICE A DAY 180 capsule 3  Polyethylene Glycol 3350 (MIRALAX PO) Take by mouth. 1/2 capful every morning     rosuvastatin (CRESTOR) 20 MG tablet Take 1 tablet (20 mg total) by mouth daily. 365 tablet 0   topiramate (TOPAMAX) 25 MG tablet TAKE ONE TABLET BY MOUTH DAILY 325 tablet 0   pregabalin (LYRICA) 150 MG capsule Take 1 capsule (150 mg total) by mouth 2 (two) times daily. 180 capsule 0   No current facility-administered medications on file prior to visit.    BP 120/72   Pulse 88   Temp 97.9 F (36.6  C) (Oral)   Ht 4\' 11"  (1.499 m)   Wt 175 lb (79.4 kg)   SpO2 95%   BMI 35.35 kg/m       Objective:   Physical Exam Vitals and nursing note reviewed.  Constitutional:      Appearance: Normal appearance.  Cardiovascular:     Rate and Rhythm: Normal rate and regular rhythm.     Pulses: Normal pulses.     Heart sounds: Normal heart sounds.  Pulmonary:     Effort: Pulmonary effort is normal.     Breath sounds: Normal breath sounds.  Skin:    General: Skin is warm and dry.  Neurological:     General: No focal deficit present.     Mental Status: She is alert and oriented to person, place, and time.  Psychiatric:        Mood and Affect: Mood normal.        Behavior: Behavior normal.        Thought Content: Thought content normal.        Judgment: Judgment normal.        Assessment & Plan:  1. Cognitive impairment MMSE = 28/30  - will check Urine culture and MR brain.  - Possible psychosomatic?  - MR Brain Wo Contrast; Future - Urine Culture; Future - Urine Culture  [name], NP  Time spent with patient today was 34  minutes which consisted of chart review, discussing memory loss, work up, treatment answering questions and documentation.

## 2023-02-05 LAB — URINE CULTURE
MICRO NUMBER:: 14729761
SPECIMEN QUALITY:: ADEQUATE

## 2023-02-09 ENCOUNTER — Ambulatory Visit (INDEPENDENT_AMBULATORY_CARE_PROVIDER_SITE_OTHER): Payer: Medicare HMO

## 2023-02-09 VITALS — BP 122/62 | Temp 98.4°F | Ht 59.0 in | Wt 179.3 lb

## 2023-02-09 DIAGNOSIS — Z0000 Encounter for general adult medical examination without abnormal findings: Secondary | ICD-10-CM | POA: Diagnosis not present

## 2023-02-09 NOTE — Progress Notes (Signed)
Subjective:   Jean Nash is a 82 y.o. female who presents for Medicare Annual (Subsequent) preventive examination.  Review of Systems     Cardiac Risk Factors include: advanced age (>39men, >19 women);Other (see comment), Risk factor comments: Dx Pumomary Fibrosis     Objective:    Today's Vitals   02/09/23 1315  BP: 122/62  Temp: 98.4 F (36.9 C)  Weight: 179 lb 4.8 oz (81.3 kg)  Height: 4\' 11"  (1.499 m)   Body mass index is 36.21 kg/m.     02/09/2023    1:29 PM 08/08/2022    9:09 AM 03/15/2022    3:11 PM 01/19/2022    2:40 PM 07/22/2021   11:14 AM 04/24/2021    5:28 AM 12/28/2020    6:53 PM  Advanced Directives  Does Patient Have a Medical Advance Directive? Yes No Yes Yes Yes No No  Type of Paramedic of [place];Living will  [place];Living will [place];Living will [place];Living will    Does patient want to make changes to medical advance directive? No - Patient declined   No - Guardian declined     Copy of [place] in Chart? Yes - validated most recent copy scanned in chart (See row information)    No - copy requested    Would patient like information on creating a medical advance directive?  No - Patient declined     No - Patient declined    Current Medications (verified) Outpatient Encounter Medications as of 02/09/2023  Medication Sig   Bacillus Coagulans-Inulin (PROBIOTIC) 1-250 BILLION-MG CAPS Take by mouth.   butalbital-acetaminophen-caffeine (FIORICET) 50-325-40 MG tablet Take 1 tablet by mouth as needed for headache.   Calcium Carbonate-Vitamin D (CALCIUM-VITAMIN D3 PO) Take 1 tablet by mouth in the morning and at bedtime.   Cholecalciferol (VITAMIN D3) 50 MCG (2000 UT) TABS Take 2 tablets by mouth daily.   diazepam (VALIUM) 5 MG tablet Take 5 mg by mouth every 6 (six) hours as needed for anxiety. As needed (flying & dental)   DULoxetine (CYMBALTA) 60 MG  capsule Take 1 capsule (60 mg total) by mouth daily.   esomeprazole (NEXIUM) 40 MG capsule Take 1 capsule (40 mg total) by mouth daily at 12 noon. (Patient taking differently: Take 40 mg by mouth daily before breakfast.)   famotidine (PEPCID) 40 MG tablet Take 1 tablet (40 mg total) by mouth at bedtime.   levothyroxine (SYNTHROID) 100 MCG tablet TAKE ONE TABLET BY MOUTH DAILY   mirabegron ER (MYRBETRIQ) 50 MG TB24 tablet Take 1 tablet (50 mg total) by mouth daily.   montelukast (SINGULAIR) 10 MG tablet TAKE 1 TABLET BY MOUTH AT BEDTIME   nitrofurantoin, macrocrystal-monohydrate, (MACROBID) 100 MG capsule Take 1 capsule (100 mg total) by mouth at bedtime.   omega-3 acid ethyl esters (LOVAZA) 1 g capsule TAKE ONE CAPSULE BY MOUTH TWICE A DAY   Polyethylene Glycol 3350 (MIRALAX PO) Take by mouth. 1/2 capful every morning   pregabalin (LYRICA) 150 MG capsule Take 1 capsule (150 mg total) by mouth 2 (two) times daily.   rosuvastatin (CRESTOR) 20 MG tablet Take 1 tablet (20 mg total) by mouth daily.   topiramate (TOPAMAX) 25 MG tablet TAKE ONE TABLET BY MOUTH DAILY   No facility-administered encounter medications on file as of 02/09/2023.    Allergies (verified) Patient has no known allergies.   History: Past Medical History:  Diagnosis Date   Adenomatous  colon polyp    Anxiety    PHOBIAS   Arthritis    Breast cancer ([place]) 08/08/13   right LOQ   Cataract    Chronic insomnia    Cluster headaches    history of migraines / NONE FOR SEVERAL YRS   Depression    Diverticulosis    Fatty liver 2011   Fibromyalgia    GERD (gastroesophageal reflux disease)    H/O hiatal hernia    History of transfusion 08/30/2013   Hx of radiation therapy 10/29/13- 12/14/13   right chest wall 5040 cGy 28 sessions, right supraclavicular/axillary region 5040 cGy 28 sessions, right chest wall boost 1000 cGy 5 sessions   Hypothyroidism    Internal hemorrhoids    Irritable bowel syndrome    Kidney stone     Lymphedema    RT ARM - WEARS SLEEVE   Macular degeneration    hole/right eye   MDD (major depressive disorder)    Osteopenia    Other abnormal glucose    Other and unspecified hyperlipidemia    Pain in joint, shoulder region    Pneumonia [id number]   Sleep apnea    USES C-PAP   Stress incontinence, female    Past Surgical History:  Procedure Laterality Date   ABDOMINAL HYSTERECTOMY     APPENDECTOMY     BILATERAL TOTAL MASTECTOMY WITH AXILLARY LYMPH NODE DISSECTION  08/30/2013   Dr Barry Dienes   BREAST CYST ASPIRATION     9 cysts   CATARACT EXTRACTION, BILATERAL  2005/2007   CHOLECYSTECTOMY     COLONOSCOPY     CYSTOCELE REPAIR     EVACUATION BREAST HEMATOMA Left 08/31/2013   Procedure: EVACUATION HEMATOMA BREAST;  Surgeon: Stark Klein, MD;  Location: [place];  Service: General;  Laterality: Left;   EYE SURGERY     to repair macular hole   FOOT ARTHROPLASTY     lt    GANGLION CYST EXCISION     rt foot   HEMORRHOID SURGERY     03/1993   JOINT REPLACEMENT  03/15/11   left knee replacement   KNEE ARTHROSCOPY     /partial knee 2016/left knee 2012   MASS EXCISION  11/04/2011   Procedure: EXCISION MASS;  Surgeon: Cammie Sickle., MD;  Location: [facility];  Service: Orthopedics;  Laterality: Right;  excisional biopsy right ulna mass   MASTECTOMY W/ SENTINEL NODE BIOPSY Right 08/30/2013   Procedure: RIGHT  AXILLARY SENTINEL LYMPH NODE BIOPSY; Right Axillary Node Disection;  Surgeon: Stark Klein, MD;  Location: [facility];  Service: General;  Laterality: Right;  Right side nuc med 7:00    PARTIAL KNEE ARTHROPLASTY Right 11/03/2015   Procedure: RIGHT KNEE MEDIAL UNICOMPARTMENTAL ARTHROPLASTY ;  Surgeon: Gaynelle Arabian, MD;  Location: [facility];  Service: Orthopedics;  Laterality: Right;   RECTOCELE REPAIR     SIMPLE MASTECTOMY WITH AXILLARY SENTINEL NODE BIOPSY Left 08/30/2013   Procedure: Bilateral Breast Mastectomy ;  Surgeon: Stark Klein, MD;  Location: [facility];  Service:  General;  Laterality: Left;   skin tags removed     breast, panty line, neckline   TOE SURGERY     preventative crossover toe surg/right foot   TOE SURGERY  2009   left foot/screw  in 2nd toe   TONSILLECTOMY     UPPER GASTROINTESTINAL ENDOSCOPY     Family History  Problem Relation Age of Onset   Stroke Mother        died age 42  Diabetes Mother    Breast cancer Mother 66   Breast cancer Sister 40   Breast cancer Paternal Aunt 70   Diabetes Maternal Grandfather    Breast cancer Paternal Grandmother 49   Breast cancer Paternal Aunt        dx in her 36s   Cancer Maternal Grandmother        intra-abdominal cancer   Brain cancer Maternal Uncle 8   Brain cancer Cousin 65       maternal cousin   Brain cancer Cousin 71       paternal cousin   Colon cancer Neg Hx    Social History   Socioeconomic History   Marital status: Married    Spouse name: Fritz Pickerel   Number of children: 3   Years of education: Not on file   Highest education level: GED or equivalent  Occupational History   Occupation: retired Radiation protection practitioner  Tobacco Use   Smoking status: Former    Packs/day: 0.10    Years: 2.00    Additional pack years: 0.00    Total pack years: 0.20    Types: Cigarettes    Start date: 11/16/1959    Quit date: 11/15/1960    Years since quitting: 62.2    Passive exposure: Past (over 64 years ago)   Smokeless tobacco: Never  Vaping Use   Vaping Use: Never used  Substance and Sexual Activity   Alcohol use: Yes    Comment: rarely   Drug use: No   Sexual activity: Yes    Comment: menarche age 62, fist live birth 49, P 3, hysterectomy age 30, no HRT, BCP 2 yrs  Other Topics Concern   Not on file  Social History Narrative   Occupation:  Retired Radiation protection practitioner    Married with 3 grown children      Never Smoked     Alcohol use-no        Social Determinants of Health   Financial Resource Strain: Low Risk  (02/09/2023)   Overall Financial Resource Strain (CARDIA)    Difficulty of Paying  Living Expenses: Not hard at all  Food Insecurity: No Food Insecurity (02/09/2023)   Hunger Vital Sign    Worried About Running Out of Food in the Last Year: Never true    Ran Out of Food in the Last Year: Never true  Transportation Needs: No Transportation Needs (02/09/2023)   PRAPARE - Hydrologist (Medical): No    Lack of Transportation (Non-Medical): No  Physical Activity: Inactive (02/09/2023)   Exercise Vital Sign    Days of Exercise per Week: 0 days    Minutes of Exercise per Session: 0 min  Stress: No Stress Concern Present (02/09/2023)   [place]    Feeling of Stress : Not at all  Social Connections: [place] (02/09/2023)   Social Connection and Isolation Panel [NHANES]    Frequency of Communication with Friends and Family: More than three times a week    Frequency of Social Gatherings with Friends and Family: More than three times a week    Attends Religious Services: More than 4 times per year    Active Member of [employer] or Organizations: Yes    Attends Music therapist: More than 4 times per year    Marital Status: Married    Tobacco Counseling Counseling given: Not Answered   Clinical Intake:  Pain : No/denies pain  How often do  you need to have someone help you when you read instructions, pamphlets, or other written materials from your doctor or pharmacy?: 1 - Never  Diabetic?  No  Interpreter Needed?: No Information entered by :: Rolene Arbour LPN   Activities of Daily Living    02/09/2023    1:22 PM 02/02/2023    8:49 AM  In your present state of health, do you have any difficulty performing the following activities:  Hearing? 0 0  Vision? 0 0  Difficulty concentrating or making decisions? 1 1  Comment Followed by PCP   Walking or climbing stairs? 1 1  Comment Followed by PCP   Dressing or bathing? 0 1  Doing errands, shopping? 1 1   Comment Husband assist   Preparing Food and eating ? Y Y  Comment Husband assist   Using the Toilet? N N  In the past six months, have you accidently leaked urine? Tempie Donning  Comment Wears pads.   Do you have problems with loss of bowel control? N N  Managing your Medications? N Y  Managing your Finances? Y N  Comment Husband assist   Housekeeping or managing your Housekeeping? Tempie Donning  Comment Husband assist     Patient Care Team: [name], NP as PCP - General (Family Medicine) Susa Day, MD as Consulting Physician (Orthopedic Surgery) Earnie Larsson, [facility] as Pharmacist (Pharmacist)  Indicate any recent Medical Services you may have received from other than [facility] providers in the past year (date may be approximate).     Assessment:   This is a routine wellness examination for Jean Nash.  Hearing/Vision screen Hearing Screening - Comments:: Denies hearing difficulties   Vision Screening - Comments:: Wears rx glasses - up to date with routine eye exams with  Dr Pamelia Hoit  Dietary issues and exercise activities discussed: Exercise limited by: None identified   Goals Addressed               This Visit's Progress     Patient Stated (pt-stated)        I would like to live as long my father at 59        Depression Screen    02/09/2023    1:22 PM 01/03/2023    8:30 AM 11/01/2022    2:04 PM 08/11/2022    3:14 PM 06/30/2022    2:02 PM 03/18/2022   10:56 AM 12/03/2021    8:39 AM  PHQ 2/9 Scores  PHQ - 2 Score 0 4 0  3 0 0  PHQ- 9 Score 0 15 4  11  0 0     Information is confidential and restricted. Go to Review Flowsheets to unlock data.    Fall Risk    02/09/2023    1:29 PM 02/02/2023    8:49 AM 01/03/2023    8:30 AM 12/28/2022    2:32 PM 10/24/2022   10:45 PM  Fall Risk   Falls in the past year? 0 0 0 0 0  Number falls in past yr: 0 0 0 0   Injury with Fall? 0 0 0 0   Risk for fall due to : No Fall Risks  Other (Comment) Other (Comment)   Follow up Falls  prevention discussed  Falls evaluation completed Falls evaluation completed     [place]:  Any stairs in or around the home? No  If so, are there any without handrails? No  Home free of loose throw rugs in walkways,  pet beds, electrical cords, etc? Yes  Adequate lighting in your home to reduce risk of falls? Yes   ASSISTIVE DEVICES UTILIZED TO PREVENT FALLS:  Life alert? No  Use of a cane, walker or w/c? No  Grab bars in the bathroom? Yes  Shower chair or bench in shower? No  Elevated toilet seat or a handicapped toilet? No   TIMED UP AND GO:  Was the test performed? Yes .  Length of time to ambulate 10 feet: 10 sec.   Gait slow and steady without use of assistive device  Cognitive Function:    02/03/2023    2:14 PM 10/27/2022    1:49 PM  MMSE - Mini Mental State Exam  Orientation to time 5 5  Orientation to Place 4 5  Registration 3 3  Attention/ Calculation 5 5  Recall 2 2  Language- name 2 objects 2 2  Language- repeat 1 1  Language- follow 3 step command 3 3  Language- read & follow direction 1 1  Write a sentence 1 1  Copy design 1 1  Total score 28 29        02/09/2023    1:29 PM  6CIT Screen  What Year? 0 points  What month? 0 points  What time? 0 points  Count back from 20 0 points  Months in reverse 0 points  Repeat phrase 0 points  Total Score 0 points    Immunizations Immunization History  Administered Date(s) Administered   Fluad Quad(high Dose 65+) 07/13/2019, 10/01/2020, 07/31/2021   Influenza Split 07/27/2012   Influenza Whole 09/13/2007, 08/08/2008, 07/30/2009, 08/25/2010   Influenza, High Dose Seasonal PF 08/03/2014, 08/08/2015, 08/23/2016, 07/14/2017, 07/20/2018, 08/12/2022   Influenza,inj,Quad PF,6+ Mos 07/25/2013   PFIZER Comirnaty(Gray Top)Covid-19 Tri-Sucrose Vaccine 02/18/2021, 09/07/2021, 08/12/2022   PFIZER(Purple Top)SARS-COV-2 Vaccination 12/23/2019, 01/17/2020, 08/18/2020   Pneumococcal  Conjugate-13 08/16/2013   Pneumococcal Polysaccharide-23 06/18/2008   Tdap 11/29/2012   Zoster Recombinat (Shingrix) 04/11/2018, 09/22/2018   Zoster, Live 08/05/2011    TDAP status: Due, Education has been provided regarding the importance of this vaccine. Advised may receive this vaccine at local pharmacy or Health Dept. Aware to provide a copy of the vaccination record if obtained from local pharmacy or Health Dept. Verbalized acceptance and understanding.  Flu Vaccine status: Up to date  Pneumococcal vaccine status: Up to date  Covid-19 vaccine status: Completed vaccines  Qualifies for Shingles Vaccine? Yes   Zostavax completed Yes   Shingrix Completed?: Yes  Screening Tests Health Maintenance  Topic Date Due   DTaP/Tdap/Td (2 - Td or Tdap) 11/29/2022   COVID-19 Vaccine (7 - 2023-24 season) 02/25/2023 (Originally 10/07/2022)   Medicare Annual Wellness (AWV)  02/09/2024   Pneumonia Vaccine 33+ Years old  Completed   INFLUENZA VACCINE  Completed   DEXA SCAN  Completed   Hepatitis C Screening  Completed   Zoster Vaccines- Shingrix  Completed   HPV VACCINES  Aged Out    Health Maintenance  Health Maintenance Due  Topic Date Due   DTaP/Tdap/Td (2 - Td or Tdap) 11/29/2022    Colorectal cancer screening: No longer required.   Mammogram status: No longer required due to Age.    Lung Cancer Screening: (Low Dose CT Chest recommended if Age 30-80 years, 30 pack-year currently smoking OR have quit w/in 15years.) does not qualify.      Additional Screening:  Hepatitis C Screening: does qualify; Completed 11/25/15  Vision Screening: Recommended annual ophthalmology exams for early detection of glaucoma and  other disorders of the eye. Is the patient up to date with their annual eye exam?  Yes  Who is the provider or what is the name of the office in which the patient attends annual eye exams? Dr Pamelia Hoit If pt is not established with a provider, would they like to be  referred to a provider to establish care? No .   Dental Screening: Recommended annual dental exams for proper oral hygiene  Community Resource Referral / Chronic Care Management:  CRR required this visit?  No   CCM required this visit?  No      Plan:     I have personally reviewed and noted the following in the patient's chart:   Medical and social history Use of alcohol, tobacco or illicit drugs  Current medications and supplements including opioid prescriptions. Patient is not currently taking opioid prescriptions. Functional ability and status Nutritional status Physical activity Advanced directives List of other physicians Hospitalizations, surgeries, and ER visits in previous 12 months Vitals Screenings to include cognitive, depression, and falls Referrals and appointments  In addition, I have reviewed and discussed with patient certain preventive protocols, quality metrics, and best practice recommendations. A written personalized care plan for preventive services as well as general preventive health recommendations were provided to patient.     Criselda Peaches, LPN   [id number]   Nurse Notes: None

## 2023-02-09 NOTE — Patient Instructions (Addendum)
Ms. Frazier , Thank you for taking time to come for your Medicare Wellness Visit. I appreciate your ongoing commitment to your health goals. Please review the following plan we discussed and let me know if I can assist you in the future.   These are the goals we discussed:  Goals       Patient Stated (pt-stated)      I would like to live as long my father at 14       Weight (lb) < 150 lb (68 kg)        This is a list of the screening recommended for you and due dates:  Health Maintenance  Topic Date Due   DTaP/Tdap/Td vaccine (2 - Td or Tdap) 11/29/2022   COVID-19 Vaccine (7 - 2023-24 season) 02/25/2023*   Medicare Annual Wellness Visit  02/09/2024   Pneumonia Vaccine  Completed   Flu Shot  Completed   DEXA scan (bone density measurement)  Completed   Hepatitis C Screening: USPSTF Recommendation to screen - Ages 57-79 yo.  Completed   Zoster (Shingles) Vaccine  Completed   HPV Vaccine  Aged Out  *Topic was postponed. The date shown is not the original due date.    Advanced directives: In chart  Conditions/risks identified: None  Next appointment: Follow up in one year for your annual wellness visit    Preventive Care 65 Years and Older, Female Preventive care refers to lifestyle choices and visits with your health care provider that can promote health and wellness. What does preventive care include? A yearly physical exam. This is also called an annual well check. Dental exams once or twice a year. Routine eye exams. Ask your health care provider how often you should have your eyes checked. Personal lifestyle choices, including: Daily care of your teeth and gums. Regular physical activity. Eating a healthy diet. Avoiding tobacco and drug use. Limiting alcohol use. Practicing safe sex. Taking low-dose aspirin every day. Taking vitamin and mineral supplements as recommended by your health care provider. What happens during an annual well check? The services and  screenings done by your health care provider during your annual well check will depend on your age, overall health, lifestyle risk factors, and family history of disease. Counseling  Your health care provider may ask you questions about your: Alcohol use. Tobacco use. Drug use. Emotional well-being. Home and relationship well-being. Sexual activity. Eating habits. History of falls. Memory and ability to understand (cognition). Work and work Statistician. Reproductive health. Screening  You may have the following tests or measurements: Height, weight, and BMI. Blood pressure. Lipid and cholesterol levels. These may be checked every 5 years, or more frequently if you are over 25 years old. Skin check. Lung cancer screening. You may have this screening every year starting at age 65 if you have a 30-pack-year history of smoking and currently smoke or have quit within the past 15 years. Fecal occult blood test (FOBT) of the stool. You may have this test every year starting at age 8. Flexible sigmoidoscopy or colonoscopy. You may have a sigmoidoscopy every 5 years or a colonoscopy every 10 years starting at age 41. Hepatitis C blood test. Hepatitis B blood test. Sexually transmitted disease (STD) testing. Diabetes screening. This is done by checking your blood sugar (glucose) after you have not eaten for a while (fasting). You may have this done every 1-3 years. Bone density scan. This is done to screen for osteoporosis. You may have this done starting at age  65. Mammogram. This may be done every 1-2 years. Talk to your health care provider about how often you should have regular mammograms. Talk with your health care provider about your test results, treatment options, and if necessary, the need for more tests. Vaccines  Your health care provider may recommend certain vaccines, such as: Influenza vaccine. This is recommended every year. Tetanus, diphtheria, and acellular pertussis (Tdap,  Td) vaccine. You may need a Td booster every 10 years. Zoster vaccine. You may need this after age 28. Pneumococcal 13-valent conjugate (PCV13) vaccine. One dose is recommended after age 82. Pneumococcal polysaccharide (PPSV23) vaccine. One dose is recommended after age 13. Talk to your health care provider about which screenings and vaccines you need and how often you need them. This information is not intended to replace advice given to you by your health care provider. Make sure you discuss any questions you have with your health care provider. Document Released: 11/28/2015 Document Revised: 07/21/2016 Document Reviewed: 09/02/2015 Elsevier Interactive Patient Education  2017 [employer] Prevention in the Home Falls can cause injuries. They can happen to people of all ages. There are many things you can do to make your home safe and to help prevent falls. What can I do on the outside of my home? Regularly fix the edges of walkways and driveways and fix any cracks. Remove anything that might make you trip as you walk through a door, such as a raised step or threshold. Trim any bushes or trees on the path to your home. Use bright outdoor lighting. Clear any walking paths of anything that might make someone trip, such as rocks or tools. Regularly check to see if handrails are loose or broken. Make sure that both sides of any steps have handrails. Any raised decks and porches should have guardrails on the edges. Have any leaves, snow, or ice cleared regularly. Use sand or salt on walking paths during winter. Clean up any spills in your garage right away. This includes oil or grease spills. What can I do in the bathroom? Use night lights. Install grab bars by the toilet and in the tub and shower. Do not use towel bars as grab bars. Use non-skid mats or decals in the tub or shower. If you need to sit down in the shower, use a plastic, non-slip stool. Keep the floor dry. Clean up any  water that spills on the floor as soon as it happens. Remove soap buildup in the tub or shower regularly. Attach bath mats securely with double-sided non-slip rug tape. Do not have throw rugs and other things on the floor that can make you trip. What can I do in the bedroom? Use night lights. Make sure that you have a light by your bed that is easy to reach. Do not use any sheets or blankets that are too big for your bed. They should not hang down onto the floor. Have a firm chair that has side arms. You can use this for support while you get dressed. Do not have throw rugs and other things on the floor that can make you trip. What can I do in the kitchen? Clean up any spills right away. Avoid walking on wet floors. Keep items that you use a lot in easy-to-reach places. If you need to reach something above you, use a strong step stool that has a grab bar. Keep electrical cords out of the way. Do not use floor polish or wax that makes floors slippery.  If you must use wax, use non-skid floor wax. Do not have throw rugs and other things on the floor that can make you trip. What can I do with my stairs? Do not leave any items on the stairs. Make sure that there are handrails on both sides of the stairs and use them. Fix handrails that are broken or loose. Make sure that handrails are as long as the stairways. Check any carpeting to make sure that it is firmly attached to the stairs. Fix any carpet that is loose or worn. Avoid having throw rugs at the top or bottom of the stairs. If you do have throw rugs, attach them to the floor with carpet tape. Make sure that you have a light switch at the top of the stairs and the bottom of the stairs. If you do not have them, ask someone to add them for you. What else can I do to help prevent falls? Wear shoes that: Do not have high heels. Have rubber bottoms. Are comfortable and fit you well. Are closed at the toe. Do not wear sandals. If you use a  stepladder: Make sure that it is fully opened. Do not climb a closed stepladder. Make sure that both sides of the stepladder are locked into place. Ask someone to hold it for you, if possible. Clearly mark and make sure that you can see: Any grab bars or handrails. First and last steps. Where the edge of each step is. Use tools that help you move around (mobility aids) if they are needed. These include: Canes. Walkers. Scooters. Crutches. Turn on the lights when you go into a dark area. Replace any light bulbs as soon as they burn out. Set up your furniture so you have a clear path. Avoid moving your furniture around. If any of your floors are uneven, fix them. If there are any pets around you, be aware of where they are. Review your medicines with your doctor. Some medicines can make you feel dizzy. This can increase your chance of falling. Ask your doctor what other things that you can do to help prevent falls. This information is not intended to replace advice given to you by your health care provider. Make sure you discuss any questions you have with your health care provider. Document Released: 08/28/2009 Document Revised: 04/08/2016 Document Reviewed: 12/06/2014 Elsevier Interactive Patient Education  2017 [employer].

## 2023-02-15 ENCOUNTER — Ambulatory Visit (INDEPENDENT_AMBULATORY_CARE_PROVIDER_SITE_OTHER): Payer: Medicare HMO

## 2023-02-15 DIAGNOSIS — R4189 Other symptoms and signs involving cognitive functions and awareness: Secondary | ICD-10-CM

## 2023-02-16 ENCOUNTER — Telehealth: Payer: Self-pay | Admitting: Adult Health

## 2023-02-16 DIAGNOSIS — I6381 Other cerebral infarction due to occlusion or stenosis of small artery: Secondary | ICD-10-CM

## 2023-02-16 DIAGNOSIS — R4189 Other symptoms and signs involving cognitive functions and awareness: Secondary | ICD-10-CM

## 2023-02-16 NOTE — Telephone Encounter (Signed)
Updated patient and her husband on MRI findings which showed   Moderate chronic small vessel ischemic changes within the cerebral white matter and pons, progressed from the prior brain MRI of 02/08/2015. 3. Chronic lacunar infarcts within bilateral deep gray nuclei, progressed in number from the prior MRI.  Due to cognitive issues intermittently will refer to neurology for further assessment

## 2023-02-17 ENCOUNTER — Encounter: Payer: Self-pay | Admitting: Adult Health

## 2023-02-17 DIAGNOSIS — K219 Gastro-esophageal reflux disease without esophagitis: Secondary | ICD-10-CM

## 2023-02-18 ENCOUNTER — Encounter: Payer: Self-pay | Admitting: Adult Health

## 2023-02-22 MED ORDER — ESOMEPRAZOLE MAGNESIUM 40 MG PO CPDR
40.0000 mg | DELAYED_RELEASE_CAPSULE | Freq: Every day | ORAL | 0 refills | Status: DC
Start: 2023-02-22 — End: 2024-02-21

## 2023-02-25 ENCOUNTER — Ambulatory Visit (AMBULATORY_SURGERY_CENTER): Payer: Medicare HMO | Admitting: *Deleted

## 2023-02-25 VITALS — Ht 59.0 in | Wt 178.0 lb

## 2023-02-25 DIAGNOSIS — R195 Other fecal abnormalities: Secondary | ICD-10-CM

## 2023-02-25 DIAGNOSIS — Z86010 Personal history of colonic polyps: Secondary | ICD-10-CM

## 2023-02-25 MED ORDER — NA SULFATE-K SULFATE-MG SULF 17.5-3.13-1.6 GM/177ML PO SOLN
1.0000 | Freq: Once | ORAL | 0 refills | Status: AC
Start: 2023-02-25 — End: 2023-02-25

## 2023-02-25 NOTE — Progress Notes (Signed)

## 2023-02-28 ENCOUNTER — Other Ambulatory Visit: Payer: Self-pay | Admitting: Adult Health

## 2023-03-01 ENCOUNTER — Ambulatory Visit (INDEPENDENT_AMBULATORY_CARE_PROVIDER_SITE_OTHER): Payer: Medicare HMO | Admitting: Adult Health

## 2023-03-01 ENCOUNTER — Encounter: Payer: Self-pay | Admitting: Adult Health

## 2023-03-01 VITALS — BP 110/70 | HR 75 | Temp 97.8°F | Ht 59.0 in | Wt 181.0 lb

## 2023-03-01 DIAGNOSIS — L7682 Other postprocedural complications of skin and subcutaneous tissue: Secondary | ICD-10-CM

## 2023-03-01 NOTE — Telephone Encounter (Signed)
FYI

## 2023-03-01 NOTE — Progress Notes (Signed)
Subjective:    [name], female    DOB: 02-Jul-1941, 82 y.o.   MRN: [id number]  HPI  82 year old female who  has a past medical history of Adenomatous colon polyp, Anxiety, Arthritis, Breast cancer (08/08/2013), Cataract, Chronic insomnia, Cluster headaches, Depression, Diverticulosis, Fatty liver (2011), Fibromyalgia, GERD (gastroesophageal reflux disease), H/O hiatal hernia, History of transfusion (08/30/2013), radiation therapy (10/29/13- 12/14/13), Hypothyroidism, Internal hemorrhoids, Irritable bowel syndrome, Kidney stone, Lymphedema, Macular degeneration, MDD (major depressive disorder), Osteopenia, Osteopenia, Other abnormal glucose, Other and unspecified hyperlipidemia, Pain in joint, shoulder region, Pneumonia (4782,9562), Sleep apnea, and Stress incontinence, female.  She presents to the office today for an acute on chronic issue of right chest wall pain at site of mastectomy.Pain is a little lower than where she has had in the past. Pain was really bad for about two days and then has improved slightly, states " I probably would not have made the appointment if the pain was like this "  She had a recent Ct chest done which did not show any reoccurrence of breast cancer. CT scan did show  thickening in the colon at the hepatic flexure. She has a colonoscopy scheduled.   She has been using Lyrica daily to help control her pain   Tylenol has not helped   Review of Systems See HPI   Past Medical History:  Diagnosis Date   Adenomatous colon polyp    Anxiety    PHOBIAS   Arthritis    Breast cancer 08/08/2013   right LOQ   Cataract    Chronic insomnia    Cluster headaches    history of migraines / NONE FOR SEVERAL YRS   Depression    Diverticulosis    Fatty liver 2011   Fibromyalgia    GERD (gastroesophageal reflux disease)    H/O hiatal hernia    History of transfusion 08/30/2013   Hx of radiation therapy 10/29/13- 12/14/13   right chest wall 5040 cGy 28  sessions, right supraclavicular/axillary region 5040 cGy 28 sessions, right chest wall boost 1000 cGy 5 sessions   Hypothyroidism    Internal hemorrhoids    Irritable bowel syndrome    Kidney stone    Lymphedema    RT ARM - WEARS SLEEVE   Macular degeneration    hole/right eye   MDD (major depressive disorder)    Osteopenia    Osteopenia    Other abnormal glucose    Other and unspecified hyperlipidemia    Pain in joint, shoulder region    Pneumonia 1308,6578   Sleep apnea    USES C-PAP   Stress incontinence, female     Social History   Socioeconomic History   Marital status: Married    Spouse name: [name]   Number of children: 3   Years of education: Not on file   Highest education level: GED or equivalent  Occupational History   Occupation: retired Catering manager  Tobacco Use   Smoking status: Former    Packs/day: 0.10    Years: 2.00    Additional pack years: 0.00    Total pack years: 0.20    Types: Cigarettes    Start date: 11/16/1959    Quit date: 11/15/1960    Years since quitting: 62.3    Passive exposure: Past (over 58 years ago)   Smokeless tobacco: Never  Vaping Use   Vaping Use: Never used  Substance and Sexual Activity   Alcohol use: Yes    Comment: rarely  Drug use: No   Sexual activity: Yes    Comment: menarche age 6, fist live birth 6, P 3, hysterectomy age 84, no HRT, BCP 2 yrs  Other Topics Concern   Not on file  Social History Narrative   Occupation:  Retired Catering manager    Married with 3 grown children      Never Smoked     Alcohol use-no        Social Determinants of Health   Financial Resource Strain: Low Risk  (02/09/2023)   Overall Financial Resource Strain (CARDIA)    Difficulty of Paying Living Expenses: Not hard at all  Food Insecurity: No Food Insecurity (02/09/2023)   Hunger Vital Sign    Worried About Running Out of Food in the Last Year: Never true    Ran Out of Food in the Last Year: Never true  Transportation Needs: No  Transportation Needs (02/09/2023)   PRAPARE - Administrator, Civil Service (Medical): No    Lack of Transportation (Non-Medical): No  Physical Activity: Inactive (02/09/2023)   Exercise Vital Sign    Days of Exercise per Week: 0 days    Minutes of Exercise per Session: 0 min  Stress: No Stress Concern Present (02/09/2023)   [employer] of Occupational Health - Occupational Stress Questionnaire    Feeling of Stress : Not at all  Social Connections: Socially Integrated (02/09/2023)   Social Connection and Isolation Panel [NHANES]    Frequency of Communication with Friends and Family: More than three times a week    Frequency of Social Gatherings with Friends and Family: More than three times a week    Attends Religious Services: More than 4 times per year    Active Member of Clubs or Organizations: Yes    Attends Banker Meetings: More than 4 times per year    Marital Status: Married  Catering manager Violence: Not At Risk (02/09/2023)   Humiliation, Afraid, Rape, and Kick questionnaire    Fear of Current or Ex-Partner: No    Emotionally Abused: No    Physically Abused: No    Sexually Abused: No    Past Surgical History:  Procedure Laterality Date   ABDOMINAL HYSTERECTOMY     APPENDECTOMY     BILATERAL TOTAL MASTECTOMY WITH AXILLARY LYMPH NODE DISSECTION  08/30/2013   Dr Donell Beers   BREAST CYST ASPIRATION     9 cysts   CATARACT EXTRACTION, BILATERAL  2005/2007   CHOLECYSTECTOMY     COLONOSCOPY     CYSTOCELE REPAIR     EVACUATION BREAST HEMATOMA Left 08/31/2013   Procedure: EVACUATION HEMATOMA BREAST;  Surgeon: Almond Lint, MD;  Location: [facility] OR;  Service: General;  Laterality: Left;   EYE SURGERY     to repair macular hole   FOOT ARTHROPLASTY     lt    GANGLION CYST EXCISION     rt foot   HEMORRHOID SURGERY     03/1993   JOINT REPLACEMENT  03/15/11   left knee replacement   KNEE ARTHROSCOPY     /partial knee 2016/left knee 2012   MASS EXCISION   11/04/2011   Procedure: EXCISION MASS;  Surgeon: Wyn Forster., MD;  Location: [facility];  Service: Orthopedics;  Laterality: Right;  excisional biopsy right ulna mass   MASTECTOMY W/ SENTINEL NODE BIOPSY Right 08/30/2013   Procedure: RIGHT  AXILLARY SENTINEL LYMPH NODE BIOPSY; Right Axillary Node Disection;  Surgeon: Almond Lint, MD;  Location: [facility]  OR;  Service: General;  Laterality: Right;  Right side nuc med 7:00    PARTIAL KNEE ARTHROPLASTY Right 11/03/2015   Procedure: RIGHT KNEE MEDIAL UNICOMPARTMENTAL ARTHROPLASTY ;  Surgeon: Ollen Gross, MD;  Location: [facility];  Service: Orthopedics;  Laterality: Right;   RECTOCELE REPAIR     SIMPLE MASTECTOMY WITH AXILLARY SENTINEL NODE BIOPSY Left 08/30/2013   Procedure: Bilateral Breast Mastectomy ;  Surgeon: Almond Lint, MD;  Location: [facility] OR;  Service: General;  Laterality: Left;   skin tags removed     breast, panty line, neckline   TOE SURGERY     preventative crossover toe surg/right foot   TOE SURGERY  2009   left foot/screw  in 2nd toe   TONSILLECTOMY     UPPER GASTROINTESTINAL ENDOSCOPY      Family History  Problem Relation Age of Onset   Stroke Mother        died age 65   Diabetes Mother    Breast cancer Mother 36   Breast cancer Sister 5   Brain cancer Maternal Uncle 8   Breast cancer Paternal Aunt 64   Breast cancer Paternal Aunt        dx in her 47s   Cancer Maternal Grandmother        intra-abdominal cancer   Diabetes Maternal Grandfather    Breast cancer Paternal Grandmother 72   Brain cancer Cousin 60       maternal cousin   Brain cancer Cousin 20       paternal cousin   Colon cancer Neg Hx    Colon polyps Neg Hx    Crohn's disease Neg Hx    Esophageal cancer Neg Hx    Rectal cancer Neg Hx    Stomach cancer Neg Hx    Ulcerative colitis Neg Hx     No Known Allergies  Current Outpatient Medications on File Prior to Visit  Medication Sig Dispense Refill   Bacillus Coagulans-Inulin  (PROBIOTIC) 1-250 BILLION-MG CAPS Take by mouth.     butalbital-acetaminophen-caffeine (FIORICET) 50-325-40 MG tablet Take 1 tablet by mouth as needed for headache. 30 tablet 2   Calcium Carbonate-Vitamin D (CALCIUM-VITAMIN D3 PO) Take 1 tablet by mouth in the morning and at bedtime.     Cholecalciferol (VITAMIN D3) 50 MCG (2000 UT) TABS Take 2 tablets by mouth daily.     diazepam (VALIUM) 5 MG tablet Take 5 mg by mouth every 6 (six) hours as needed for anxiety. As needed (flying & dental)     DULoxetine (CYMBALTA) 60 MG capsule Take 1 capsule (60 mg total) by mouth daily. 90 capsule 1   esomeprazole (NEXIUM) 40 MG capsule Take 1 capsule (40 mg total) by mouth daily at 12 noon. 365 capsule 0   famotidine (PEPCID) 40 MG tablet Take 1 tablet (40 mg total) by mouth at bedtime. 90 tablet 0   levothyroxine (SYNTHROID) 100 MCG tablet TAKE ONE TABLET BY MOUTH DAILY 365 tablet 0   mirabegron ER (MYRBETRIQ) 50 MG TB24 tablet Take 1 tablet (50 mg total) by mouth daily. 30 tablet 0   montelukast (SINGULAIR) 10 MG tablet TAKE 1 TABLET BY MOUTH AT BEDTIME 90 tablet 1   nitrofurantoin, macrocrystal-monohydrate, (MACROBID) 100 MG capsule Take 1 capsule (100 mg total) by mouth at bedtime. 365 capsule 0   omega-3 acid ethyl esters (LOVAZA) 1 g capsule TAKE ONE CAPSULE BY MOUTH TWICE A DAY 180 capsule 3   Polyethylene Glycol 3350 (MIRALAX PO) Take by mouth. 1/2  capful every morning     rosuvastatin (CRESTOR) 20 MG tablet Take 1 tablet (20 mg total) by mouth daily. 365 tablet 0   topiramate (TOPAMAX) 25 MG tablet TAKE ONE TABLET BY MOUTH DAILY 325 tablet 0   pregabalin (LYRICA) 150 MG capsule Take 1 capsule (150 mg total) by mouth 2 (two) times daily. 180 capsule 0   No current facility-administered medications on file prior to visit.    BP 110/70   Pulse 75   Temp 97.8 F (36.6 C) (Oral)   Ht  (1.499 m)   Wt 181 lb (82.1 kg)   SpO2 93%   BMI 36.56 kg/m       Objective:   Physical Exam Vitals  and nursing note reviewed.  Constitutional:      Appearance: Normal appearance.  Cardiovascular:     Rate and Rhythm: Normal rate and regular rhythm.     Pulses: Normal pulses.     Heart sounds: Normal heart sounds.  Pulmonary:     Effort: Pulmonary effort is normal.     Breath sounds: Normal breath sounds.  Musculoskeletal:        General: Tenderness (at sight of radiation tattoo on right chest wall.) present. Normal range of motion.  Skin:    General: Skin is warm and dry.  Neurological:     General: No focal deficit present.     Mental Status: She is alert and oriented to person, place, and time.  Psychiatric:        Mood and Affect: Mood normal.        Behavior: Behavior normal.        Thought Content: Thought content normal.        Judgment: Judgment normal.       Assessment & Plan:  1. Incisional pain - Will have her increase her lyrica to BID dosing for the next few days  Shirline Frees, NP

## 2023-03-02 ENCOUNTER — Encounter: Payer: Self-pay | Admitting: Adult Health

## 2023-03-03 ENCOUNTER — Ambulatory Visit (INDEPENDENT_AMBULATORY_CARE_PROVIDER_SITE_OTHER): Payer: Medicare HMO | Admitting: Neurology

## 2023-03-03 ENCOUNTER — Telehealth: Payer: Self-pay | Admitting: Neurology

## 2023-03-03 ENCOUNTER — Encounter: Payer: Self-pay | Admitting: Neurology

## 2023-03-03 ENCOUNTER — Ambulatory Visit (INDEPENDENT_AMBULATORY_CARE_PROVIDER_SITE_OTHER): Payer: Medicare HMO | Admitting: Podiatry

## 2023-03-03 ENCOUNTER — Encounter: Payer: Self-pay | Admitting: Podiatry

## 2023-03-03 ENCOUNTER — Encounter: Payer: Self-pay | Admitting: Adult Health

## 2023-03-03 VITALS — BP 131/66 | HR 81 | Ht 59.0 in | Wt 181.0 lb

## 2023-03-03 DIAGNOSIS — B351 Tinea unguium: Secondary | ICD-10-CM | POA: Diagnosis not present

## 2023-03-03 DIAGNOSIS — I639 Cerebral infarction, unspecified: Secondary | ICD-10-CM

## 2023-03-03 DIAGNOSIS — W19XXXA Unspecified fall, initial encounter: Secondary | ICD-10-CM

## 2023-03-03 DIAGNOSIS — M79676 Pain in unspecified toe(s): Secondary | ICD-10-CM | POA: Diagnosis not present

## 2023-03-03 DIAGNOSIS — R413 Other amnesia: Secondary | ICD-10-CM | POA: Diagnosis not present

## 2023-03-03 DIAGNOSIS — G3184 Mild cognitive impairment, so stated: Secondary | ICD-10-CM

## 2023-03-03 NOTE — Patient Instructions (Signed)
I had a long discussion with the patient and her husband regarding her memory and mild cognitive difficulties which likely is a combination of age-related mild cognitive impairment and silent cerebrovascular disease.  Recommend further evaluation by checking memory panel labs, EEG, lipid profile, hemoglobin A1c and MRAs of the brain and neck.  Recommend she start taking aspirin 81 mg daily for stroke prevention and maintain aggressive risk factor modification with strict control of hypertension with blood pressure goal below 140/90 lipids with LDL cholesterol goal below 70 mg percent.  Encouraged her to eat a healthy diet with lots of vegetables, salads and whole grains and avoid red meat.  Discontinue Topamax for migraines are not frequent enough and it may be contributing to her memory loss.  I encouraged her to increase participation in cognitively challenging activities like solving crossword puzzles, playing bridge, sudoku and word searches.  We discussed memory compensation strategies.  She will return for follow-up in 4 months or call earlier if necessary. She had a unfortunate fall in the parking lot prior to this visit and had abrasion in the right temporal which was cleaned with saline and he was provided ice pack to hold.  He denies any acute neurological complaints and she was advised to call or go to the ER if headache,, vomiting or any significant change develops later.  She and her husband voiced understanding.  Memory Compensation Strategies  Use "WARM" strategy.  W= write it down  A= associate it  R= repeat it  M= make a mental note  2.   You can keep a Glass blower/designer.  Use a 3-ring notebook with sections for the following: calendar, important names and phone numbers,  medications, doctors' names/phone numbers, lists/reminders, and a section to journal what you did  each day.   3.    Use a calendar to write appointments down.  4.    Write yourself a schedule for the day.  This can  be placed on the calendar or in a separate section of the Memory Notebook.  Keeping a  regular schedule can help memory.  5.    Use medication organizer with sections for each day or morning/evening pills.  You may need help loading it  6.    Keep a basket, or pegboard by the door.  Place items that you need to take out with you in the basket or on the pegboard.  You may also want to  include a message board for reminders.  7.    Use sticky notes.  Place sticky notes with reminders in a place where the task is performed.  For example: " turn off the  stove" placed by the stove, "lock the door" placed on the door at eye level, " take your medications" on  the bathroom mirror or by the place where you normally take your medications.  8.    Use alarms/timers.  Use while cooking to remind yourself to check on food or as a reminder to take your medicine, or as a  reminder to make a call, or as a reminder to perform another task, etc.

## 2023-03-03 NOTE — Progress Notes (Signed)
She presents today chief complaint of painful elongated toenails.  Objective: Toenails are long thick yellow dystrophic onychomycotic pulses are palpable.  No open lesions or wounds.  Assessment: Pain limb secondary onychomycosis.  Plan: Debridement of toenails 1 through 5 bilateral.

## 2023-03-03 NOTE — Progress Notes (Signed)
[facility] [address] [place]. [place] [address] [phone]       OFFICE CONSULT NOTE  [name] Date of Birth:  06-14-1941 Medical Record Number:  [id number]   Referring MD: Ruffin Pyo, NP  Reason for Referral: Memory loss  HPI: [name] is a 82 year old pleasant Caucasian lady seen today for initial office consultation visit for memory loss.  She is accompanied by her husband.  History is obtained from them and review of electronic medical records.  I personally reviewed pertinent available imaging films in PACS.Marland Kitchen  She has past medical history of hypothyroidism, hyperlipidemia, insomnia, hearing loss, gastroesophageal reflux disease, irritable bowel syndrome, overactive bladder, history of breast cancer, fibromyalgia.  She states that after COVID 2 years ago she noticed some memory difficulties.  This is mostly short-term and at times she may remember later.  She has good days and bad days.  Fall, head injury, loss of consciousness, seizures, strokes.  She does have lifelong history of migraine headaches but these are quite infrequent and she takes Tylenol or Fioricet rarely tramadol which helps.  She has been on Topamax 25 mg daily for headache prevention and migraine for quite some time.  She was seen by primary physician last month tested 28/30 on Mini-Mental.  MRI scan of the brain was done as an outpatient on 02/15/2019 close which showed changes chronic small vessel disease old small bilateral lacunar subcortical infarcts.  Patient denies any prior history of focal neurological deficits to suggest stroke.  Patient has no family history of Alzheimer's dementia.    Today unfortunately after parking her car in the parking lot to come for this visit upon entering the building she was holding onto her husband and mis stepped falling hitting the curb. She was in a initial shock. She has a scrape on the right side of her head beside her eye where her glasses  were on. This is swelling some. She has a scrape on inside of the left hand palm. She has scrape on the left knee.  All the affected sites were treated clean  and dry  by nurse Baird Lyons with sterile saline and she was provided ice pack to hold onto her head.  She had a nonfocal exam did not have any headache or other neurological complaints moves all 4 extremities equally well and is released.  She stated that she was shaken but all right and wanted to proceed with visit ROS:   14 system review of systems is positive for memory loss, fall, headache, bruising all other systems negative  PMH:  Past Medical History:  Diagnosis Date   Adenomatous colon polyp    Anxiety    PHOBIAS   Arthritis    Breast cancer 08/08/2013   right LOQ   Cataract    Chronic insomnia    Cluster headaches    history of migraines / NONE FOR SEVERAL YRS   Depression    Diverticulosis    Fatty liver 2011   Fibromyalgia    GERD (gastroesophageal reflux disease)    H/O hiatal hernia    History of transfusion 08/30/2013   Hx of radiation therapy 10/29/13- 12/14/13   right chest wall 5040 cGy 28 sessions, right supraclavicular/axillary region 5040 cGy 28 sessions, right chest wall boost 1000 cGy 5 sessions   Hypothyroidism    Internal hemorrhoids    Irritable bowel syndrome    Kidney stone    Lymphedema    RT ARM - WEARS SLEEVE  Macular degeneration    hole/right eye   MDD (major depressive disorder)    Osteopenia    Osteopenia    Other abnormal glucose    Other and unspecified hyperlipidemia    Pain in joint, shoulder region    Pneumonia 1610,9604   Sleep apnea    USES C-PAP   Stress incontinence, female     Social History:  Social History   Socioeconomic History   Marital status: Married    Spouse name: [name]   Number of children: 3   Years of education: Not on file   Highest education level: GED or equivalent  Occupational History   Occupation: retired Catering manager  Tobacco Use   Smoking status:  Former    Packs/day: 0.10    Years: 2.00    Additional pack years: 0.00    Total pack years: 0.20    Types: Cigarettes    Start date: 11/16/1959    Quit date: 11/15/1960    Years since quitting: 62.3    Passive exposure: Past (over 58 years ago)   Smokeless tobacco: Never  Vaping Use   Vaping Use: Never used  Substance and Sexual Activity   Alcohol use: Yes    Comment: rarely   Drug use: No   Sexual activity: Yes    Comment: menarche age 27, fist live birth 76, P 3, hysterectomy age 25, no HRT, BCP 2 yrs  Other Topics Concern   Not on file  Social History Narrative   Occupation:  Retired Catering manager    Married with 3 grown children      Never Smoked     Alcohol use-no        Social Determinants of Health   Financial Resource Strain: Low Risk  (02/09/2023)   Overall Financial Resource Strain (CARDIA)    Difficulty of Paying Living Expenses: Not hard at all  Food Insecurity: No Food Insecurity (02/09/2023)   Hunger Vital Sign    Worried About Running Out of Food in the Last Year: Never true    Ran Out of Food in the Last Year: Never true  Transportation Needs: No Transportation Needs (02/09/2023)   PRAPARE - Administrator, Civil Service (Medical): No    Lack of Transportation (Non-Medical): No  Physical Activity: Inactive (02/09/2023)   Exercise Vital Sign    Days of Exercise per Week: 0 days    Minutes of Exercise per Session: 0 min  Stress: No Stress Concern Present (02/09/2023)   [employer] of Occupational Health - Occupational Stress Questionnaire    Feeling of Stress : Not at all  Social Connections: Socially Integrated (02/09/2023)   Social Connection and Isolation Panel [NHANES]    Frequency of Communication with Friends and Family: More than three times a week    Frequency of Social Gatherings with Friends and Family: More than three times a week    Attends Religious Services: More than 4 times per year    Active Member of [employer] or Organizations: Yes     Attends Banker Meetings: More than 4 times per year    Marital Status: Married  Catering manager Violence: Not At Risk (02/09/2023)   Humiliation, Afraid, Rape, and Kick questionnaire    Fear of Current or Ex-Partner: No    Emotionally Abused: No    Physically Abused: No    Sexually Abused: No    Medications:   Current Outpatient Medications on File Prior to Visit  Medication Sig Dispense Refill  Bacillus Coagulans-Inulin (PROBIOTIC) 1-250 BILLION-MG CAPS Take by mouth.     butalbital-acetaminophen-caffeine (FIORICET) 50-325-40 MG tablet Take 1 tablet by mouth as needed for headache. 30 tablet 2   Calcium Carbonate-Vitamin D (CALCIUM-VITAMIN D3 PO) Take 1 tablet by mouth in the morning and at bedtime.     Cholecalciferol (VITAMIN D3) 50 MCG (2000 UT) TABS Take 2 tablets by mouth daily.     diazepam (VALIUM) 5 MG tablet Take 5 mg by mouth every 6 (six) hours as needed for anxiety. As needed (flying & dental)     DULoxetine (CYMBALTA) 60 MG capsule Take 1 capsule (60 mg total) by mouth daily. 90 capsule 1   esomeprazole (NEXIUM) 40 MG capsule Take 1 capsule (40 mg total) by mouth daily at 12 noon. 365 capsule 0   famotidine (PEPCID) 40 MG tablet Take 1 tablet (40 mg total) by mouth at bedtime. 90 tablet 0   levothyroxine (SYNTHROID) 100 MCG tablet TAKE ONE TABLET BY MOUTH DAILY 365 tablet 0   mirabegron ER (MYRBETRIQ) 50 MG TB24 tablet Take 1 tablet (50 mg total) by mouth daily. 30 tablet 0   montelukast (SINGULAIR) 10 MG tablet TAKE 1 TABLET BY MOUTH AT BEDTIME 90 tablet 1   nitrofurantoin, macrocrystal-monohydrate, (MACROBID) 100 MG capsule Take 1 capsule (100 mg total) by mouth at bedtime. 365 capsule 0   omega-3 acid ethyl esters (LOVAZA) 1 g capsule TAKE ONE CAPSULE BY MOUTH TWICE A DAY 180 capsule 3   Polyethylene Glycol 3350 (MIRALAX PO) Take by mouth. 1/2 capful every morning     rosuvastatin (CRESTOR) 20 MG tablet Take 1 tablet (20 mg total) by mouth daily. 365  tablet 0   pregabalin (LYRICA) 150 MG capsule Take 1 capsule (150 mg total) by mouth 2 (two) times daily. (Patient taking differently: Take 150 mg by mouth daily.) 180 capsule 0   No current facility-administered medications on file prior to visit.    Allergies:  No Known Allergies  Physical Exam General: obese elderly pleasant Caucasian lady,seated, in no evident distress Head: head normocephalic and atraumatic.   Neck: supple with no carotid or supraclavicular bruits Cardiovascular: regular rate and rhythm, no murmurs Musculoskeletal: no deformity Skin:  no rash/petichiae Vascular:  Normal pulses all extremities  Neurologic Exam Mental Status: Awake and fully alert. Oriented to place and time. Recent and remote memory intact. Attention span, concentration and fund of knowledge appropriate. Mood and affect appropriate.  Mini-Mental status exam scored 26/30 with deficits in recall.  Clock drawing 4/4.  Able to name 13 animals which can walk on 4 legs.  Able to copy intersecting pentagons quite well. Cranial Nerves: Fundoscopic exam reveals sharp disc margins. Pupils equal, briskly reactive to light. Extraocular movements full without nystagmus. Visual fields full to confrontation. Hearing intact. Facial sensation intact. Face, tongue, palate moves normally and symmetrically.  Motor: Normal bulk and tone. Normal strength in all tested extremity muscles. Sensory.: intact to touch , pinprick , position and vibratory sensation.  Coordination: Rapid alternating movements normal in all extremities. Finger-to-nose and heel-to-shin performed accurately bilaterally. Gait and Station: Arises from chair without difficulty. Stance is normal. Gait demonstrates normal stride length and balance .  Not able to heel, toe and tandem walk without difficulty.  Reflexes: 1+ and symmetric. Toes downgoing.      ASSESSMENT: 82 year old Caucasian lady with mild short-term memory and cognitive difficulties likely  due to mild cognitive impairment which appears age-appropriate.     PLAN:I had a long discussion with  the patient and her husband regarding her memory and mild cognitive difficulties which likely is a combination of age-related mild cognitive impairment and silent cerebrovascular disease.  Recommend further evaluation by checking memory panel labs, EEG, lipid profile, hemoglobin A1c and MRAs of the brain and neck.  Recommend she start taking aspirin 81 mg daily for stroke prevention and maintain aggressive risk factor modification with strict control of hypertension with blood pressure goal below 140/90 lipids with LDL cholesterol goal below 70 mg percent.  Encouraged her to eat a healthy diet with lots of vegetables, salads and whole grains and avoid red meat.  Discontinue Topamax for migraines are not frequent enough and it may be contributing to her memory loss.  I encouraged her to increase participation in cognitively challenging activities like solving crossword puzzles, playing bridge, sudoku and word searches.  We discussed memory compensation strategies.  She will return for follow-up in 4 months or call earlier if necessary. She had a unfortunate fall in the parking lot prior to this visit and had abrasion in the right temporal which was cleaned with saline and he was provided ice pack to hold.  He denies any acute neurological complaints and she was advised to call or go to the ER if headache,, vomiting or any significant change develops later.  She and her husband voiced understanding. Greater than 50% time during this 60-minute consultation visit was spent on counseling and coordination of care about her complaints of memory loss and cognitive impairment and addressing her fall in the parking lot and answering questions Delia Heady, MD Note: This document was prepared with digital dictation and possible smart phrase technology. Any transcriptional errors that result from this process are  unintentional.

## 2023-03-03 NOTE — Progress Notes (Signed)
Pt presents to the office. Upon entering the building she was holding onto her husband and mis stepped falling hitting the curb. She was in a initial shock. She has a scrape on the right side of her head beside her eye where her glasses were on. This is swelling some. She has a scrape on inside of the left hand palm. She has scrape on the left knee.  I have cleaned all of these sites up with sterile saline and provided a ice pack to the patient to hold to her head. She is able to complete range of motion with her wrist to what was her baseline. Able to lift both legs up and out to extension.  Main at this time left knee hurts "even when not moving", left hand on the palm hurts where she tried to catch herself and she has a headache.  Denied loc. BP while checking her in was 131/66

## 2023-03-03 NOTE — Telephone Encounter (Signed)
[employer] medicare sent to [facility] they obtain auth [id number] 

## 2023-03-04 ENCOUNTER — Encounter: Payer: Self-pay | Admitting: Adult Health

## 2023-03-04 ENCOUNTER — Other Ambulatory Visit: Payer: Self-pay | Admitting: Adult Health

## 2023-03-04 ENCOUNTER — Encounter: Payer: Self-pay | Admitting: Neurology

## 2023-03-04 ENCOUNTER — Ambulatory Visit (INDEPENDENT_AMBULATORY_CARE_PROVIDER_SITE_OTHER): Payer: Medicare HMO | Admitting: Adult Health

## 2023-03-04 VITALS — BP 120/70 | HR 82 | Temp 97.5°F | Ht 59.0 in | Wt 182.0 lb

## 2023-03-04 DIAGNOSIS — S0990XA Unspecified injury of head, initial encounter: Secondary | ICD-10-CM

## 2023-03-04 LAB — DEMENTIA PANEL
Homocysteine: 8.7 umol/L (ref 0.0–19.2)
RPR Ser Ql: NONREACTIVE
TSH: 1.15 u[IU]/mL (ref 0.450–4.500)
Vitamin B-12: 1492 pg/mL — ABNORMAL HIGH (ref 232–1245)

## 2023-03-04 LAB — LIPID PANEL
Chol/HDL Ratio: 3 ratio (ref 0.0–4.4)
Cholesterol, Total: 153 mg/dL (ref 100–199)
HDL: 51 mg/dL (ref >39)
LDL Chol Calc (NIH): 75 mg/dL (ref 0–99)
Triglycerides: 161 mg/dL — ABNORMAL HIGH (ref 0–149)
VLDL Cholesterol Cal: 27 mg/dL (ref 5–40)

## 2023-03-04 LAB — HEMOGLOBIN A1C
Est. average glucose Bld gHb Est-mCnc: 131 mg/dL
Hgb A1c MFr Bld: 6.2 % — ABNORMAL HIGH (ref 4.8–5.6)

## 2023-03-04 MED ORDER — TRAMADOL HCL 50 MG PO TABS: 50.0000 mg | ORAL_TABLET | Freq: Three times a day (TID) | ORAL | 0 refills | Status: AC | PRN

## 2023-03-04 NOTE — Progress Notes (Signed)
Kindly inform the patient that lab work for reversible causes of memory loss is mostly normal though 1 test result is not back yet. Blood work for cholesterol profile and screening test for diabetes are both borderline but acceptable

## 2023-03-04 NOTE — Progress Notes (Signed)
Subjective:    [name], female    DOB: 04-29-1941, 82 y.o.   MRN: [id number]  HPI 82 year old female who  has a past medical history of Adenomatous colon polyp, Anxiety, Arthritis, Breast cancer (08/08/2013), Cataract, Chronic insomnia, Cluster headaches, Depression, Diverticulosis, Fatty liver (2011), Fibromyalgia, GERD (gastroesophageal reflux disease), H/O hiatal hernia, History of transfusion (08/30/2013), radiation therapy (10/29/13- 12/14/13), Hypothyroidism, Internal hemorrhoids, Irritable bowel syndrome, Kidney stone, Lymphedema, Macular degeneration, MDD (major depressive disorder), Osteopenia, Osteopenia, Other abnormal glucose, Other and unspecified hyperlipidemia, Pain in joint, shoulder region, Pneumonia (4098,1191), Sleep apnea, and Stress incontinence, female.  She presents to the office today for a fall.  She was being seen at neurology yesterday and her way into the building she was holding onto her husband and misstep falling hitting the curb.  She scraped the right side of her head beside her eye and the inside of her left palm.  She also has scrapes on both knees.  Today she reports that she is sore having some pain and feeling a little dizzy time to time.  There was no LOC, nausea, or vomiting.   Review of Systems See HPI  Past Medical History:  Diagnosis Date   Adenomatous colon polyp    Anxiety    PHOBIAS   Arthritis    Breast cancer 08/08/2013   right LOQ   Cataract    Chronic insomnia    Cluster headaches    history of migraines / NONE FOR SEVERAL YRS   Depression    Diverticulosis    Fatty liver 2011   Fibromyalgia    GERD (gastroesophageal reflux disease)    H/O hiatal hernia    History of transfusion 08/30/2013   Hx of radiation therapy 10/29/13- 12/14/13   right chest wall 5040 cGy 28 sessions, right supraclavicular/axillary region 5040 cGy 28 sessions, right chest wall boost 1000 cGy 5 sessions   Hypothyroidism    Internal  hemorrhoids    Irritable bowel syndrome    Kidney stone    Lymphedema    RT ARM - WEARS SLEEVE   Macular degeneration    hole/right eye   MDD (major depressive disorder)    Osteopenia    Osteopenia    Other abnormal glucose    Other and unspecified hyperlipidemia    Pain in joint, shoulder region    Pneumonia 4782,9562   Sleep apnea    USES C-PAP   Stress incontinence, female     Social History   Socioeconomic History   Marital status: Married    Spouse name: [name]   Number of children: 3   Years of education: Not on file   Highest education level: GED or equivalent  Occupational History   Occupation: retired Catering manager  Tobacco Use   Smoking status: Former    Packs/day: 0.10    Years: 2.00    Additional pack years: 0.00    Total pack years: 0.20    Types: Cigarettes    Start date: 11/16/1959    Quit date: 11/15/1960    Years since quitting: 62.3    Passive exposure: Past (over 58 years ago)   Smokeless tobacco: Never  Vaping Use   Vaping Use: Never used  Substance and Sexual Activity   Alcohol use: Yes    Comment: rarely   Drug use: No   Sexual activity: Yes    Comment: menarche age 58, fist live birth 89, P 3, hysterectomy age 30, no HRT, BCP 2 yrs  Other Topics Concern   Not on file  Social History Narrative   Occupation:  Retired Catering manager    Married with 3 grown children      Never Smoked     Alcohol use-no        Social Determinants of Health   Financial Resource Strain: Low Risk  (02/09/2023)   Overall Financial Resource Strain (CARDIA)    Difficulty of Paying Living Expenses: Not hard at all  Food Insecurity: No Food Insecurity (02/09/2023)   Hunger Vital Sign    Worried About Running Out of Food in the Last Year: Never true    Ran Out of Food in the Last Year: Never true  Transportation Needs: No Transportation Needs (02/09/2023)   PRAPARE - Administrator, Civil Service (Medical): No    Lack of Transportation (Non-Medical): No   Physical Activity: Inactive (02/09/2023)   Exercise Vital Sign    Days of Exercise per Week: 0 days    Minutes of Exercise per Session: 0 min  Stress: No Stress Concern Present (02/09/2023)   [employer] of Occupational Health - Occupational Stress Questionnaire    Feeling of Stress : Not at all  Social Connections: Socially Integrated (02/09/2023)   Social Connection and Isolation Panel [NHANES]    Frequency of Communication with Friends and Family: More than three times a week    Frequency of Social Gatherings with Friends and Family: More than three times a week    Attends Religious Services: More than 4 times per year    Active Member of Clubs or Organizations: Yes    Attends Banker Meetings: More than 4 times per year    Marital Status: Married  Catering manager Violence: Not At Risk (02/09/2023)   Humiliation, Afraid, Rape, and Kick questionnaire    Fear of Current or Ex-Partner: No    Emotionally Abused: No    Physically Abused: No    Sexually Abused: No    Past Surgical History:  Procedure Laterality Date   ABDOMINAL HYSTERECTOMY     APPENDECTOMY     BILATERAL TOTAL MASTECTOMY WITH AXILLARY LYMPH NODE DISSECTION  08/30/2013   Dr Donell Beers   BREAST CYST ASPIRATION     9 cysts   CATARACT EXTRACTION, BILATERAL  2005/2007   CHOLECYSTECTOMY     COLONOSCOPY     CYSTOCELE REPAIR     EVACUATION BREAST HEMATOMA Left 08/31/2013   Procedure: EVACUATION HEMATOMA BREAST;  Surgeon: Almond Lint, MD;  Location: [facility];  Service: General;  Laterality: Left;   EYE SURGERY     to repair macular hole   FOOT ARTHROPLASTY     lt    GANGLION CYST EXCISION     rt foot   HEMORRHOID SURGERY     03/1993   JOINT REPLACEMENT  03/15/11   left knee replacement   KNEE ARTHROSCOPY     /partial knee 2016/left knee 2012   MASS EXCISION  11/04/2011   Procedure: EXCISION MASS;  Surgeon: Wyn Forster., MD;  Location: [facility];  Service: Orthopedics;   Laterality: Right;  excisional biopsy right ulna mass   MASTECTOMY W/ SENTINEL NODE BIOPSY Right 08/30/2013   Procedure: RIGHT  AXILLARY SENTINEL LYMPH NODE BIOPSY; Right Axillary Node Disection;  Surgeon: Almond Lint, MD;  Location: [facility];  Service: General;  Laterality: Right;  Right side nuc med 7:00    PARTIAL KNEE ARTHROPLASTY Right 11/03/2015   Procedure: RIGHT KNEE MEDIAL UNICOMPARTMENTAL ARTHROPLASTY ;  Surgeon: Ollen Gross, MD;  Location: [facility];  Service: Orthopedics;  Laterality: Right;   RECTOCELE REPAIR     SIMPLE MASTECTOMY WITH AXILLARY SENTINEL NODE BIOPSY Left 08/30/2013   Procedure: Bilateral Breast Mastectomy ;  Surgeon: Almond Lint, MD;  Location: [facility];  Service: General;  Laterality: Left;   skin tags removed     breast, panty line, neckline   TOE SURGERY     preventative crossover toe surg/right foot   TOE SURGERY  2009   left foot/screw  in 2nd toe   TONSILLECTOMY     UPPER GASTROINTESTINAL ENDOSCOPY      Family History  Problem Relation Age of Onset   Stroke Mother        died age 79   Diabetes Mother    Breast cancer Mother 12   Breast cancer Sister 51   Brain cancer Maternal Uncle 8   Breast cancer Paternal Aunt 44   Breast cancer Paternal Aunt        dx in her 25s   Cancer Maternal Grandmother        intra-abdominal cancer   Diabetes Maternal Grandfather    Breast cancer Paternal Grandmother 44   Brain cancer Cousin 32       maternal cousin   Brain cancer Cousin 20       paternal cousin   Colon cancer Neg Hx    Colon polyps Neg Hx    Crohn's disease Neg Hx    Esophageal cancer Neg Hx    Rectal cancer Neg Hx    Stomach cancer Neg Hx    Ulcerative colitis Neg Hx     No Known Allergies  Current Outpatient Medications on File Prior to Visit  Medication Sig Dispense Refill   Bacillus Coagulans-Inulin (PROBIOTIC) 1-250 BILLION-MG CAPS Take by mouth.     butalbital-acetaminophen-caffeine (FIORICET) 50-325-40 MG tablet Take 1 tablet by mouth  as needed for headache. 30 tablet 2   Calcium Carbonate-Vitamin D (CALCIUM-VITAMIN D3 PO) Take 1 tablet by mouth in the morning and at bedtime.     Cholecalciferol (VITAMIN D3) 50 MCG (2000 UT) TABS Take 2 tablets by mouth daily.     diazepam (VALIUM) 5 MG tablet Take 5 mg by mouth every 6 (six) hours as needed for anxiety. As needed (flying & dental)     DULoxetine (CYMBALTA) 60 MG capsule Take 1 capsule (60 mg total) by mouth daily. 90 capsule 1   esomeprazole (NEXIUM) 40 MG capsule Take 1 capsule (40 mg total) by mouth daily at 12 noon. (Patient taking differently: Take 40 mg by mouth every morning.) 365 capsule 0   famotidine (PEPCID) 40 MG tablet Take 1 tablet (40 mg total) by mouth at bedtime. 90 tablet 0   levothyroxine (SYNTHROID) 100 MCG tablet TAKE ONE TABLET BY MOUTH DAILY 365 tablet 0   mirabegron ER (MYRBETRIQ) 50 MG TB24 tablet Take 1 tablet (50 mg total) by mouth daily. (Patient taking differently: Take 50 mg by mouth every morning.) 30 tablet 0   montelukast (SINGULAIR) 10 MG tablet TAKE 1 TABLET BY MOUTH AT BEDTIME 90 tablet 1   Na Sulfate-K Sulfate-Mg Sulf 17.5-3.13-1.6 GM/177ML SOLN Take by mouth.     nitrofurantoin, macrocrystal-monohydrate, (MACROBID) 100 MG capsule Take 1 capsule (100 mg total) by mouth at bedtime. 365 capsule 0   omega-3 acid ethyl esters (LOVAZA) 1 g capsule TAKE ONE CAPSULE BY MOUTH TWICE A DAY 180 capsule 3   Polyethylene Glycol 3350 (MIRALAX PO) Take  by mouth. 1/2 capful every morning     rosuvastatin (CRESTOR) 20 MG tablet Take 1 tablet (20 mg total) by mouth daily. (Patient taking differently: Take 20 mg by mouth at bedtime.) 365 tablet 0   pregabalin (LYRICA) 150 MG capsule Take 1 capsule (150 mg total) by mouth 2 (two) times daily. (Patient taking differently: Take 150 mg by mouth daily.) 180 capsule 0   No current facility-administered medications on file prior to visit.    BP 120/70   Pulse 82   Temp (!) 97.5 F (36.4 C) (Oral)   Ht   (1.499 m)   Wt 182 lb (82.6 kg)   SpO2 95%   BMI 36.76 kg/m       Objective:   Physical Exam Vitals and nursing note reviewed.  Constitutional:      Appearance: Normal appearance.  Cardiovascular:     Rate and Rhythm: Normal rate and regular rhythm.     Pulses: Normal pulses.     Heart sounds: Normal heart sounds.  Pulmonary:     Effort: Pulmonary effort is normal.     Breath sounds: Normal breath sounds.  Musculoskeletal:        General: Normal range of motion.  Skin:    General: Skin is warm and dry.     Comments: Small abrasion on right temple, left palm and bilateral knees.    Neurological:     General: No focal deficit present.     Mental Status: She is alert and oriented to person, place, and time.  Psychiatric:        Mood and Affect: Mood normal.        Behavior: Behavior normal.        Thought Content: Thought content normal.        Judgment: Judgment normal.       Assessment & Plan:  1. Injury of head, initial encounter - No signs of concussion  - Advised rest over the weekend  - Follow up as needed  Shirline Frees, NP

## 2023-03-08 ENCOUNTER — Encounter: Payer: Self-pay | Admitting: Neurology

## 2023-03-10 ENCOUNTER — Ambulatory Visit (INDEPENDENT_AMBULATORY_CARE_PROVIDER_SITE_OTHER): Payer: Medicare HMO

## 2023-03-10 ENCOUNTER — Ambulatory Visit (INDEPENDENT_AMBULATORY_CARE_PROVIDER_SITE_OTHER): Payer: Medicare HMO | Admitting: Adult Health

## 2023-03-10 ENCOUNTER — Encounter: Payer: Self-pay | Admitting: Adult Health

## 2023-03-10 VITALS — BP 140/100 | HR 80 | Temp 97.7°F | Ht 59.0 in | Wt 182.0 lb

## 2023-03-10 DIAGNOSIS — M79642 Pain in left hand: Secondary | ICD-10-CM | POA: Diagnosis not present

## 2023-03-10 DIAGNOSIS — M79602 Pain in left arm: Secondary | ICD-10-CM

## 2023-03-10 NOTE — Progress Notes (Signed)
Subjective:    [name], female    DOB: 1941-04-27, 82 y.o.   MRN: [id number]  HPI  82 year old female who  has a past medical history of Adenomatous colon polyp, Anxiety, Arthritis, Breast cancer (08/08/2013), Cataract, Chronic insomnia, Cluster headaches, Depression, Diverticulosis, Fatty liver (2011), Fibromyalgia, GERD (gastroesophageal reflux disease), H/O hiatal hernia, History of transfusion (08/30/2013), radiation therapy (10/29/13- 12/14/13), Hypothyroidism, Internal hemorrhoids, Irritable bowel syndrome, Kidney stone, Lymphedema, Macular degeneration, MDD (major depressive disorder), Osteopenia, Osteopenia, Other abnormal glucose, Other and unspecified hyperlipidemia, Pain in joint, shoulder region, Pneumonia ([id number],7829), Sleep apnea, and Stress incontinence, female.  She presents to the office today for left arm and hand pain after fall last week while at her neurology appointent. She tripped over curb and stopped herself with her hands. Since then she has been " tremendous" pain in her left hand and left arm. She feels as though she has no strength in her left arm, stating " I put my arm on my bed and it gave out"  She has not been taking anything for pain. Has prescribed Tramadol she takes PRN for headaches      Review of Systems See HPI   Past Medical History:  Diagnosis Date   Adenomatous colon polyp    Anxiety    PHOBIAS   Arthritis    Breast cancer 08/08/2013   right LOQ   Cataract    Chronic insomnia    Cluster headaches    history of migraines / NONE FOR SEVERAL YRS   Depression    Diverticulosis    Fatty liver 2011   Fibromyalgia    GERD (gastroesophageal reflux disease)    H/O hiatal hernia    History of transfusion 08/30/2013   Hx of radiation therapy 10/29/13- 12/14/13   right chest wall 5040 cGy 28 sessions, right supraclavicular/axillary region 5040 cGy 28 sessions, right chest wall boost 1000 cGy 5 sessions   Hypothyroidism     Internal hemorrhoids    Irritable bowel syndrome    Kidney stone    Lymphedema    RT ARM - WEARS SLEEVE   Macular degeneration    hole/right eye   MDD (major depressive disorder)    Osteopenia    Osteopenia    Other abnormal glucose    Other and unspecified hyperlipidemia    Pain in joint, shoulder region    Pneumonia 5621,3086   Sleep apnea    USES C-PAP   Stress incontinence, female     Social History   Socioeconomic History   Marital status: Married    Spouse name: [name]   Number of children: 3   Years of education: Not on file   Highest education level: GED or equivalent  Occupational History   Occupation: retired Catering manager  Tobacco Use   Smoking status: Former    Packs/day: 0.10    Years: 2.00    Additional pack years: 0.00    Total pack years: 0.20    Types: Cigarettes    Start date: 11/16/1959    Quit date: 11/15/1960    Years since quitting: 62.3    Passive exposure: Past (over 58 years ago)   Smokeless tobacco: Never  Vaping Use   Vaping Use: Never used  Substance and Sexual Activity   Alcohol use: Yes    Comment: rarely   Drug use: No   Sexual activity: Yes    Comment: menarche age 74, fist live birth 65, P 3, hysterectomy age 63, no  HRT, BCP 2 yrs  Other Topics Concern   Not on file  Social History Narrative   Occupation:  Retired Catering manager    Married with 3 grown children      Never Smoked     Alcohol use-no        Social Determinants of Health   Financial Resource Strain: Low Risk  (02/09/2023)   Overall Financial Resource Strain (CARDIA)    Difficulty of Paying Living Expenses: Not hard at all  Food Insecurity: No Food Insecurity (02/09/2023)   Hunger Vital Sign    Worried About Running Out of Food in the Last Year: Never true    Ran Out of Food in the Last Year: Never true  Transportation Needs: No Transportation Needs (02/09/2023)   PRAPARE - Administrator, Civil Service (Medical): No    Lack of Transportation (Non-Medical):  No  Physical Activity: Inactive (02/09/2023)   Exercise Vital Sign    Days of Exercise per Week: 0 days    Minutes of Exercise per Session: 0 min  Stress: No Stress Concern Present (02/09/2023)   [employer] of Occupational Health - Occupational Stress Questionnaire    Feeling of Stress : Not at all  Social Connections: Socially Integrated (02/09/2023)   Social Connection and Isolation Panel [NHANES]    Frequency of Communication with Friends and Family: More than three times a week    Frequency of Social Gatherings with Friends and Family: More than three times a week    Attends Religious Services: More than 4 times per year    Active Member of Clubs or Organizations: Yes    Attends Banker Meetings: More than 4 times per year    Marital Status: Married  Catering manager Violence: Not At Risk (02/09/2023)   Humiliation, Afraid, Rape, and Kick questionnaire    Fear of Current or Ex-Partner: No    Emotionally Abused: No    Physically Abused: No    Sexually Abused: No    Past Surgical History:  Procedure Laterality Date   ABDOMINAL HYSTERECTOMY     APPENDECTOMY     BILATERAL TOTAL MASTECTOMY WITH AXILLARY LYMPH NODE DISSECTION  08/30/2013   Dr Donell Beers   BREAST CYST ASPIRATION     9 cysts   CATARACT EXTRACTION, BILATERAL  2005/2007   CHOLECYSTECTOMY     COLONOSCOPY     CYSTOCELE REPAIR     EVACUATION BREAST HEMATOMA Left 08/31/2013   Procedure: EVACUATION HEMATOMA BREAST;  Surgeon: Almond Lint, MD;  Location: [facility];  Service: General;  Laterality: Left;   EYE SURGERY     to repair macular hole   FOOT ARTHROPLASTY     lt    GANGLION CYST EXCISION     rt foot   HEMORRHOID SURGERY     03/1993   JOINT REPLACEMENT  03/15/11   left knee replacement   KNEE ARTHROSCOPY     /partial knee 2016/left knee 2012   MASS EXCISION  11/04/2011   Procedure: EXCISION MASS;  Surgeon: Wyn Forster., MD;  Location: [facility];  Service: Orthopedics;   Laterality: Right;  excisional biopsy right ulna mass   MASTECTOMY W/ SENTINEL NODE BIOPSY Right 08/30/2013   Procedure: RIGHT  AXILLARY SENTINEL LYMPH NODE BIOPSY; Right Axillary Node Disection;  Surgeon: Almond Lint, MD;  Location: [facility];  Service: General;  Laterality: Right;  Right side nuc med 7:00    PARTIAL KNEE ARTHROPLASTY Right 11/03/2015   Procedure: RIGHT  KNEE MEDIAL UNICOMPARTMENTAL ARTHROPLASTY ;  Surgeon: Ollen Gross, MD;  Location: [facility];  Service: Orthopedics;  Laterality: Right;   RECTOCELE REPAIR     SIMPLE MASTECTOMY WITH AXILLARY SENTINEL NODE BIOPSY Left 08/30/2013   Procedure: Bilateral Breast Mastectomy ;  Surgeon: Almond Lint, MD;  Location: [facility];  Service: General;  Laterality: Left;   skin tags removed     breast, panty line, neckline   TOE SURGERY     preventative crossover toe surg/right foot   TOE SURGERY  2009   left foot/screw  in 2nd toe   TONSILLECTOMY     UPPER GASTROINTESTINAL ENDOSCOPY      Family History  Problem Relation Age of Onset   Stroke Mother        died age 84   Diabetes Mother    Breast cancer Mother 62   Breast cancer Sister 54   Brain cancer Maternal Uncle 8   Breast cancer Paternal Aunt 67   Breast cancer Paternal Aunt        dx in her 84s   Cancer Maternal Grandmother        intra-abdominal cancer   Diabetes Maternal Grandfather    Breast cancer Paternal Grandmother 40   Brain cancer Cousin 43       maternal cousin   Brain cancer Cousin 20       paternal cousin   Colon cancer Neg Hx    Colon polyps Neg Hx    Crohn's disease Neg Hx    Esophageal cancer Neg Hx    Rectal cancer Neg Hx    Stomach cancer Neg Hx    Ulcerative colitis Neg Hx     No Known Allergies  Current Outpatient Medications on File Prior to Visit  Medication Sig Dispense Refill   Bacillus Coagulans-Inulin (PROBIOTIC) 1-250 BILLION-MG CAPS Take by mouth.     butalbital-acetaminophen-caffeine (FIORICET) 50-325-40 MG tablet Take 1 tablet by mouth  as needed for headache. 30 tablet 2   Calcium Carbonate-Vitamin D (CALCIUM-VITAMIN D3 PO) Take 1 tablet by mouth in the morning and at bedtime.     Cholecalciferol (VITAMIN D3) 50 MCG (2000 UT) TABS Take 2 tablets by mouth daily.     diazepam (VALIUM) 5 MG tablet Take 5 mg by mouth every 6 (six) hours as needed for anxiety. As needed (flying & dental)     DULoxetine (CYMBALTA) 60 MG capsule Take 1 capsule (60 mg total) by mouth daily. 90 capsule 1   esomeprazole (NEXIUM) 40 MG capsule Take 1 capsule (40 mg total) by mouth daily at 12 noon. (Patient taking differently: Take 40 mg by mouth every morning.) 365 capsule 0   famotidine (PEPCID) 40 MG tablet Take 1 tablet (40 mg total) by mouth at bedtime. 90 tablet 0   levothyroxine (SYNTHROID) 100 MCG tablet TAKE ONE TABLET BY MOUTH DAILY 365 tablet 0   mirabegron ER (MYRBETRIQ) 50 MG TB24 tablet Take 1 tablet (50 mg total) by mouth daily. (Patient taking differently: Take 50 mg by mouth every morning.) 30 tablet 0   montelukast (SINGULAIR) 10 MG tablet TAKE 1 TABLET BY MOUTH AT BEDTIME 90 tablet 1   Na Sulfate-K Sulfate-Mg Sulf 17.5-3.13-1.6 GM/177ML SOLN Take by mouth.     nitrofurantoin, macrocrystal-monohydrate, (MACROBID) 100 MG capsule Take 1 capsule (100 mg total) by mouth at bedtime. 365 capsule 0   omega-3 acid ethyl esters (LOVAZA) 1 g capsule TAKE ONE CAPSULE BY MOUTH TWICE A DAY 180 capsule 3  Polyethylene Glycol 3350 (MIRALAX PO) Take by mouth. 1/2 capful every morning     rosuvastatin (CRESTOR) 20 MG tablet Take 1 tablet (20 mg total) by mouth daily. (Patient taking differently: Take 20 mg by mouth at bedtime.) 365 tablet 0   traMADol (ULTRAM) 50 MG tablet Take 1 tablet (50 mg total) by mouth every 8 (eight) hours as needed for up to 7 days. 21 tablet 0   pregabalin (LYRICA) 150 MG capsule Take 1 capsule (150 mg total) by mouth 2 (two) times daily. (Patient taking differently: Take 150 mg by mouth daily.) 180 capsule 0   No current  facility-administered medications on file prior to visit.    BP (!) 140/100   Pulse 80   Temp 97.7 F (36.5 C) (Oral)   Ht 4\' 11"  (1.499 m)   Wt 182 lb (82.6 kg)   SpO2 94%   BMI 36.76 kg/m       Objective:   Physical Exam Vitals and nursing note reviewed.  Constitutional:      Appearance: Normal appearance.  Musculoskeletal:        General: Tenderness present.     Left shoulder: Normal. No bony tenderness. Normal range of motion. Normal strength.     Left forearm: Laceration and tenderness present. No swelling, deformity or bony tenderness.     Left wrist: Bony tenderness present. No snuff box tenderness. Normal range of motion. Normal pulse.     Left hand: Swelling (trace palmer surface swelling) and bony tenderness present. Decreased range of motion. Decreased strength. Normal sensation. There is no disruption of two-point discrimination. Normal capillary refill. Normal pulse.  Skin:    General: Skin is warm and dry.  Neurological:     General: No focal deficit present.     Mental Status: She is alert and oriented to person, place, and time.  Psychiatric:        Mood and Affect: Mood normal.        Behavior: Behavior normal.        Thought Content: Thought content normal.        Judgment: Judgment normal.       Assessment & Plan:  1. Left hand pain - likely bone contusion and soft tissue strain from fall - Will check rays of hand and forearm today  - DG Hand Complete Left; Future - Can take Tramadol PRN for this pain. Encouraged icing but she does not like ice. Can take warm epsom salt baths  2. Left arm pain  - DG Forearm Left; Future  Shirline Frees, NP

## 2023-03-14 ENCOUNTER — Encounter: Payer: Self-pay | Admitting: Adult Health

## 2023-03-15 ENCOUNTER — Encounter: Payer: Self-pay | Admitting: Obstetrics and Gynecology

## 2023-03-15 ENCOUNTER — Telehealth: Payer: Self-pay | Admitting: Adult Health

## 2023-03-15 ENCOUNTER — Ambulatory Visit (INDEPENDENT_AMBULATORY_CARE_PROVIDER_SITE_OTHER): Payer: Medicare HMO | Admitting: Family Medicine

## 2023-03-15 VITALS — BP 122/82 | HR 93 | Temp 97.3°F | Ht 59.0 in | Wt 171.8 lb

## 2023-03-15 DIAGNOSIS — N390 Urinary tract infection, site not specified: Secondary | ICD-10-CM | POA: Diagnosis not present

## 2023-03-15 LAB — POCT URINALYSIS DIPSTICK
Bilirubin, UA: NEGATIVE
Blood, UA: NEGATIVE
Glucose, UA: NEGATIVE
Ketones, UA: POSITIVE
Nitrite, UA: NEGATIVE
Protein, UA: POSITIVE — AB
Spec Grav, UA: 1.02 (ref 1.010–1.025)
Urobilinogen, UA: 0.2 E.U./dL
pH, UA: 6 (ref 5.0–8.0)

## 2023-03-15 MED ORDER — CEPHALEXIN 500 MG PO CAPS: 500.0000 mg | ORAL_CAPSULE | Freq: Two times a day (BID) | ORAL | 0 refills | Status: AC

## 2023-03-15 NOTE — Progress Notes (Signed)
   Jean Nash is a 82 y.o. female who presents today for an office visit.  Assessment/Plan:  UTI No red flags. No signs of systemic infection.  History and point-of-care UA consistent with UTI.  Most recent urine culture showed Jean Nash's that was resistant to Macrobid.  Will start Keflex 500 mg twice daily.  Urine culture results are pending.  Encouraged hydration.  We discussed reasons to return to care.     Subjective:  HPI:  Patient here with her husband today.  They are concerned about possible UTI.  Symptoms started a few days ago with urinary frequency.  Some chills.  No fevers.  She is having more confusion and brain fog.  Also some decreased appetite.  No hematuria.  No dysuria.  She is on Macrobid as prophylaxis.  Her current symptoms are consistent with the past few UTIs.  She has had 3 for the past year or so.       Objective:  Physical Exam: BP 122/82   Pulse 93   Temp (!) 97.3 F (36.3 C) (Temporal)   Ht 4\' 11"  (1.499 m)   Wt 171 lb 12.8 oz (77.9 kg)   SpO2 96%   BMI 34.70 kg/m   Gen: No acute distress, resting comfortably Neuro: Grossly normal, moves all extremities Psych: Normal affect and thought content      Jean Nash M. Jimmey Ralph, MD 03/15/2023 3:03 PM

## 2023-03-15 NOTE — Telephone Encounter (Signed)
Caller states his wife has a history of UTIs. Caller states for the last two days she has been hot and cold, and has frequent urination. ---Caller states wife has a history of UTI's. States not eating, hot cold clammy Temp 97.7. Trying to force but she is not taking. Having confusion but not as bad as in the past with UTI. +frequency, +UOP in last 8 hrs.  03/15/2023 11:12:48 AM See PCP within 24 Hours Yes Nunzio Cory, Charity fundraiser, [employer]  Comments User: Holland Commons, RN Date/Time Lamount Cohen Time): 03/15/2023 11:15:54 AM Called back line and spoke with staff and appt @ 2;30p at [place] office. Referrals REFERRED TO PCP OFFICE

## 2023-03-15 NOTE — Telephone Encounter (Signed)
Spouse called this morning, stating Pt has been freezing and extremely hot for 3 days and is not eating.  Call was transferred to the Triage Nurse for advice.  Triage Nurse advised that Pt should be seen within 24 hrs.  No availability at [facility].  Pt scheduled at [facility] to see Dr. Lupita Shutter this afternoon.

## 2023-03-15 NOTE — Patient Instructions (Signed)
It was nice to see you!  You have a urinary tract infection. Please start the antibiotic.  We will check a urine culture to make sure you do not have a resistant bacteria. We will call you if we need to change your medications.   Please make sure you are drinking plenty of fluids over the next few days.  If your symptoms do not improve over the next 5-7 days, or if they worsen, please let us know. Please also let us know if you have worsening back pain, fevers, chills, or body aches.   Take care, Dr Reynald Woods  

## 2023-03-16 LAB — URINE CULTURE
MICRO NUMBER:: 14892967
SPECIMEN QUALITY:: ADEQUATE

## 2023-03-16 NOTE — Progress Notes (Unsigned)
Urogynecology Return Visit  SUBJECTIVE  History of Present Illness: Jean Nash is a 82 y.o. female seen in follow-up for UTI symptoms.   Patient has kept a bladder diary for the past year. She averages approximately 10-11 bathroom trips during the day with 3-4 at nighttime. She has been taking Myrbetriq but it does not seem to be controlling her symptoms. Also endorses frequent leakage with urgency and cough/sneeze.   Reports they are getting her Myrbetriq from [place] for 1/3 of the price.   Past Medical History: Patient  has a past medical history of Adenomatous colon polyp, Anxiety, Arthritis, Breast cancer (HCC) (08/08/2013), Cataract, Chronic insomnia, Cluster headaches, Depression, Diverticulosis, Fatty liver (2011), Fibromyalgia, GERD (gastroesophageal reflux disease), H/O hiatal hernia, History of transfusion (08/30/2013), radiation therapy (10/29/13- 12/14/13), Hypothyroidism, Internal hemorrhoids, Irritable bowel syndrome, Kidney stone, Lymphedema, Macular degeneration, MDD (major depressive disorder), Osteopenia, Osteopenia, Other abnormal glucose, Other and unspecified hyperlipidemia, Pain in joint, shoulder region, Pneumonia ([id number],9604), Sleep apnea, and Stress incontinence, female.   Past Surgical History: She  has a past surgical history that includes Appendectomy; Cholecystectomy; Abdominal hysterectomy; Tonsillectomy; Eye surgery; Cataract extraction, bilateral (2005/2007); Knee arthroscopy; Joint replacement (03/15/11); Colonoscopy; Ganglion cyst excision; Foot arthroplasty; Mass excision (11/04/2011); Hemorrhoid surgery; Breast cyst aspiration; Toe Surgery; Toe Surgery (2009); Upper gastrointestinal endoscopy; skin tags removed; Bilateral total mastectomy with axillary lymph node dissection (08/30/2013); Simple mastectomy with axillary sentinel node biopsy (Left, 08/30/2013); Mastectomy w/ sentinel node biopsy (Right, 08/30/2013); Evacuation breast hematoma (Left,  08/31/2013); Partial knee arthroplasty (Right, 11/03/2015); Cystocele repair; and Rectocele repair.   Medications: She has a current medication list which includes the following prescription(s): probiotic, butalbital-acetaminophen-caffeine, calcium carbonate-vitamin d, cephalexin, vitamin d3, diazepam, duloxetine, esomeprazole, famotidine, levothyroxine, montelukast, na sulfate-k sulfate-mg sulf, nitrofurantoin (macrocrystal-monohydrate), omega-3 acid ethyl esters, polyethylene glycol 3350, rosuvastatin, vibegron, and pregabalin.   Allergies: Patient has No Known Allergies.   Social History: Patient  reports that she quit smoking about 62 years ago. Her smoking use included cigarettes. She started smoking about 63 years ago. She has a 0.20 pack-year smoking history. She has been exposed to tobacco smoke. She has never used smokeless tobacco. She reports current alcohol use. She reports that she does not use drugs.      OBJECTIVE   Previous cultures: 03/15/23 Negative (Mixed Flora) 02/04/23 Negative (Mixed Flora) 01/13/23 Negative (Mixed Flora) 01/03/23 +Proteus Mirabilis 12/28/22: Negative 06/30/22: Negative 06/15/22: + E.coli 05/27/22: + E. coli  Physical Exam: Vitals:   03/17/23 1106  BP: 124/84  Pulse: 81   Gen: No apparent distress, A&O x 3.  Detailed Urogynecologic Evaluation:  Deferred. Prior exam showed:  POP-Q   -2                                            Aa   -2                                           Ba   -7                                              C    4  Gh   6                                            Pb   9                                            tvl    -0.5                                            Ap   -0.5                                            Bp                                                  D     ASSESSMENT AND PLAN    Jean Nash is a 82 y.o. with:  1. Recurrent UTI   2. Overactive  bladder   3. Urinary frequency   4. SUI (stress urinary incontinence, female)    Patient has had three positive cultures since 05/27/22. Her symptoms that her husband and she report include behavioral changes and brain fog. Denies burning but endorses frequency and overactive bladder symptoms. Patient has not previously used vaginal estrogen cream due to cancer fears. Encouraged her that this does not go systemically and that she should use the cream nightly for two weeks with a blueberry sized amount onto the finger and make sure she is getting it around the urethra as well as into the vagina.  Patient is not well managed on the Myrbetriq 50mg . Has previously tried trospium and is not a good candidate for other medications due to age and reported previous brain fog. Will Start Gemtesa 75mg  daily and see if this is more effective. Also discussed bladder botox and SNM briefly as options.   Start Gemtesa 75mg  daily.  She is currently wearing Nix panties for leakage. Briefly discussed urethral bulking with patient and husband as a treatment option.   Patient to f/u in 4 weeks for medication check and check how her UTI symptoms are.

## 2023-03-17 ENCOUNTER — Other Ambulatory Visit: Payer: Medicare HMO | Admitting: *Deleted

## 2023-03-17 ENCOUNTER — Telehealth: Payer: Self-pay | Admitting: Gastroenterology

## 2023-03-17 ENCOUNTER — Ambulatory Visit: Payer: Medicare HMO | Admitting: Podiatry

## 2023-03-17 ENCOUNTER — Ambulatory Visit (INDEPENDENT_AMBULATORY_CARE_PROVIDER_SITE_OTHER): Payer: Medicare HMO | Admitting: Obstetrics and Gynecology

## 2023-03-17 ENCOUNTER — Encounter: Payer: Self-pay | Admitting: Obstetrics and Gynecology

## 2023-03-17 VITALS — BP 124/84 | HR 81

## 2023-03-17 DIAGNOSIS — N390 Urinary tract infection, site not specified: Secondary | ICD-10-CM

## 2023-03-17 DIAGNOSIS — R350 Frequency of micturition: Secondary | ICD-10-CM | POA: Diagnosis not present

## 2023-03-17 DIAGNOSIS — N393 Stress incontinence (female) (male): Secondary | ICD-10-CM

## 2023-03-17 DIAGNOSIS — N3281 Overactive bladder: Secondary | ICD-10-CM | POA: Diagnosis not present

## 2023-03-17 MED ORDER — VIBEGRON 75 MG PO TABS: 75.0000 mg | ORAL_TABLET | Freq: Every day | ORAL | 5 refills | Status: DC

## 2023-03-17 NOTE — Patient Instructions (Addendum)
Stop Myrbetriq and Start Gemtesa 75mg  daily.   Start using the estrogen cream, a blueberry sized amount onto the finger and into the vagina nightly for 2 weeks. After two weeks do this just two times a week. Do not use the applicator.

## 2023-03-17 NOTE — Telephone Encounter (Signed)
Inbound call from patient's husband, states wife currently has a UTI and is taking antibiotics for the next 3-4 days. Is unsure if procedure should be rescheduled. She is currently scheduled for 5/6.

## 2023-03-17 NOTE — Progress Notes (Signed)
Urine culture is inconclusive.  She should finish her course of antibiotics and let us know if symptoms have not improved.

## 2023-03-17 NOTE — Telephone Encounter (Signed)
Spoke with husband; ok to take antibiotics as prescribed.  Understanding voiced

## 2023-03-18 NOTE — Progress Notes (Signed)
Kindly inform the patient that homocystine level is normal

## 2023-03-21 ENCOUNTER — Telehealth: Payer: Self-pay

## 2023-03-21 ENCOUNTER — Encounter: Payer: Self-pay | Admitting: Gastroenterology

## 2023-03-21 ENCOUNTER — Ambulatory Visit: Payer: Medicare HMO | Admitting: Gastroenterology

## 2023-03-21 VITALS — BP 131/67 | HR 89 | Temp 97.7°F | Ht 59.0 in | Wt 178.0 lb

## 2023-03-21 MED ORDER — SODIUM CHLORIDE 0.9 % IV SOLN: 500.0000 mL | Freq: Once | INTRAVENOUS | Status: DC

## 2023-03-21 NOTE — Telephone Encounter (Signed)
-----   Message from Jean James, RN sent at 03/21/2023  9:30 AM EDT -----  ----- Message ----- From: Micki Riley, MD Sent: 03/18/2023   7:59 AM EDT To: Gna-Pod 2 Results  Kindly inform the patient that homocystine level is normal

## 2023-03-21 NOTE — Progress Notes (Signed)
Patient stated she is still have solid stool and its brown, unable to see bottom of toilet.  Husband stated that she didn't complete prep correctly.  Dr. Russella Dar said to reschedule patient for tomorrow if available with Miralax prep.  Patient has been rescheduled and instructions provided.

## 2023-03-21 NOTE — Telephone Encounter (Signed)
I called and spoke with the patient's husband, Lyman Bishop (as per [facility]). I informed him of the results. He will relay the message to the patient.

## 2023-03-22 ENCOUNTER — Encounter: Payer: Self-pay | Admitting: Gastroenterology

## 2023-03-22 ENCOUNTER — Ambulatory Visit (AMBULATORY_SURGERY_CENTER): Payer: Medicare HMO | Admitting: Gastroenterology

## 2023-03-22 VITALS — BP 148/76 | HR 77 | Resp 14 | Ht 59.0 in | Wt 178.0 lb

## 2023-03-22 DIAGNOSIS — Z86010 Personal history of colonic polyps: Secondary | ICD-10-CM | POA: Diagnosis not present

## 2023-03-22 DIAGNOSIS — D122 Benign neoplasm of ascending colon: Secondary | ICD-10-CM

## 2023-03-22 DIAGNOSIS — D123 Benign neoplasm of transverse colon: Secondary | ICD-10-CM | POA: Diagnosis not present

## 2023-03-22 DIAGNOSIS — R933 Abnormal findings on diagnostic imaging of other parts of digestive tract: Secondary | ICD-10-CM

## 2023-03-22 DIAGNOSIS — Z09 Encounter for follow-up examination after completed treatment for conditions other than malignant neoplasm: Secondary | ICD-10-CM

## 2023-03-22 MED ORDER — SODIUM CHLORIDE 0.9 % IV SOLN
500.0000 mL | Freq: Once | INTRAVENOUS | Status: DC
Start: 2023-03-22 — End: 2023-03-22

## 2023-03-22 NOTE — Progress Notes (Signed)
Pt's states no medical or surgical changes since previsit or office visit. 

## 2023-03-22 NOTE — Op Note (Addendum)
[facility] Patient Name: Jean Nash Procedure Date: 03/22/2023 11:07 AM MRN: [id number] Endoscopist: Napoleon Form , MD, [id number] Age: 82 Referring MD:  Date of Birth: 02-10-1941 Gender: Female Account #: [id number] Procedure:                Colonoscopy Indications:              Personal history of adenoma less than 10 mm in                            size, Last colonoscopy: 2018. Abnormal CT of the GI                            tract Medicines:                Monitored Anesthesia Care Procedure:                Pre-Anesthesia Assessment:                           - Prior to the procedure, a History and Physical                            was performed, and patient medications and                            allergies were reviewed. The patient's tolerance of                            previous anesthesia was also reviewed. The risks                            and benefits of the procedure and the sedation                            options and risks were discussed with the patient.                            All questions were answered, and informed consent                            was obtained. Prior Anticoagulants: The patient has                            taken no anticoagulant or antiplatelet agents. ASA                            Grade Assessment: III - A patient with severe                            systemic disease. After reviewing the risks and                            benefits, the patient was deemed in satisfactory  condition to undergo the procedure.                           After obtaining informed consent, the colonoscope                            was passed under direct vision. Throughout the                            procedure, the patient's blood pressure, pulse, and                            oxygen saturations were monitored continuously. The                            Olympus PCF-H190DL ([id number]) Colonoscope  was                            introduced through the anus and advanced to the the                            cecum, identified by appendiceal orifice and                            ileocecal valve. The colonoscopy was performed                            without difficulty. The patient tolerated the                            procedure well. The quality of the bowel                            preparation was adequate. The ileocecal valve,                            appendiceal orifice, and rectum were photographed. Scope In: 11:09:24 AM Scope Out: 11:29:51 AM Scope Withdrawal Time: 0 hours 11 minutes 22 seconds  Total Procedure Duration: 0 hours 20 minutes 27 seconds  Findings:                 The perianal and digital rectal examinations were                            normal.                           Two sessile polyps were found in the ascending                            colon. The polyps were 9 to 12 mm in size. These                            polyps were removed with a cold snare. Resection  and retrieval were complete.                           A 25 mm polyp was found in the ascending colon. The                            polyp was mixed lateral spreading. Biopsies were                            taken with a cold forceps for histology. Area was                            tattooed with an injection of 1 mL of Spot (carbon                            black).                           Scattered large-mouthed, medium-mouthed and                            small-mouthed diverticula were found in the sigmoid                            colon, descending colon, transverse colon and                            ascending colon.                           Non-bleeding external and internal hemorrhoids were                            found during retroflexion. The hemorrhoids were                            medium-sized. Complications:            No immediate  complications. Estimated Blood Loss:     Estimated blood loss was minimal. Impression:               - Two 9 to 12 mm polyps in the ascending colon,                            removed with a cold snare. Resected and retrieved.                           - One 25 mm polyp in the ascending colon. Biopsied.                            Tattooed.                           - Severe diverticulosis in the sigmoid colon, in  the descending colon, in the transverse colon and                            in the ascending colon.                           - Non-bleeding external and internal hemorrhoids. Recommendation:           - Resume previous diet.                           - Continue present medications.                           - Await pathology results.                           - Repeat colonoscopy date to be determined after                            pending pathology results are reviewed for                            retreatment.                           - Return to GI clinic at appointment to be                            scheduled with Dr Meridee Score or Dr Russella Dar to discuss                            possible EMR for removal of large ascending colon                            lesion. Napoleon Form, MD 03/22/2023 11:39:25 AM This report has been signed electronically.

## 2023-03-22 NOTE — Progress Notes (Signed)
Report to PACU, RN, vss, BBS= Clear.  

## 2023-03-22 NOTE — Progress Notes (Signed)
[facility] History and Physical   Primary Care Physician:  Shirline Frees, NP   Reason for Procedure:  Abnormal CT of colon  Plan:    colonoscopy with possible interventions as needed     HPI: Jean Nash is a very pleasant 82 y.o. female here for  colonoscopy. Denies any nausea, vomiting, abdominal pain, melena or bright red blood per rectum  The risks and benefits as well as alternatives of endoscopic procedure(s) have been discussed and reviewed. All questions answered. The patient agrees to proceed.    Past Medical History:  Diagnosis Date   Adenomatous colon polyp    Anxiety    PHOBIAS   Arthritis    Breast cancer (HCC) 08/08/2013   right LOQ   Cataract    Chronic insomnia    Cluster headaches    history of migraines / NONE FOR SEVERAL YRS   Depression    Diverticulosis    Fatty liver 2011   Fibromyalgia    GERD (gastroesophageal reflux disease)    H/O hiatal hernia    History of transfusion 08/30/2013   Hx of radiation therapy 10/29/13- 12/14/13   right chest wall 5040 cGy 28 sessions, right supraclavicular/axillary region 5040 cGy 28 sessions, right chest wall boost 1000 cGy 5 sessions   Hypothyroidism    Internal hemorrhoids    Irritable bowel syndrome    Kidney stone    Lymphedema    RT ARM - WEARS SLEEVE   Macular degeneration    hole/right eye   MDD (major depressive disorder)    Osteopenia    Osteopenia    Other abnormal glucose    Other and unspecified hyperlipidemia    Pain in joint, shoulder region    Pneumonia 8295,6213   Sleep apnea    USES C-PAP   Stress incontinence, female     Past Surgical History:  Procedure Laterality Date   ABDOMINAL HYSTERECTOMY     APPENDECTOMY     BILATERAL TOTAL MASTECTOMY WITH AXILLARY LYMPH NODE DISSECTION  08/30/2013   Dr Donell Beers   BREAST CYST ASPIRATION     9 cysts   CATARACT EXTRACTION, BILATERAL  2005/2007   CHOLECYSTECTOMY     COLONOSCOPY     CYSTOCELE REPAIR     EVACUATION  BREAST HEMATOMA Left 08/31/2013   Procedure: EVACUATION HEMATOMA BREAST;  Surgeon: Almond Lint, MD;  Location: [facility] OR;  Service: General;  Laterality: Left;   EYE SURGERY     to repair macular hole   FOOT ARTHROPLASTY     lt    GANGLION CYST EXCISION     rt foot   HEMORRHOID SURGERY     03/1993   JOINT REPLACEMENT  03/15/11   left knee replacement   KNEE ARTHROSCOPY     /partial knee 2016/left knee 2012   MASS EXCISION  11/04/2011   Procedure: EXCISION MASS;  Surgeon: Wyn Forster., MD;  Location: [facility];  Service: Orthopedics;  Laterality: Right;  excisional biopsy right ulna mass   MASTECTOMY W/ SENTINEL NODE BIOPSY Right 08/30/2013   Procedure: RIGHT  AXILLARY SENTINEL LYMPH NODE BIOPSY; Right Axillary Node Disection;  Surgeon: Almond Lint, MD;  Location: [facility] OR;  Service: General;  Laterality: Right;  Right side nuc med 7:00    PARTIAL KNEE ARTHROPLASTY Right 11/03/2015   Procedure: RIGHT KNEE MEDIAL UNICOMPARTMENTAL ARTHROPLASTY ;  Surgeon: Ollen Gross, MD;  Location: [facility];  Service: Orthopedics;  Laterality: Right;   RECTOCELE REPAIR  SIMPLE MASTECTOMY WITH AXILLARY SENTINEL NODE BIOPSY Left 08/30/2013   Procedure: Bilateral Breast Mastectomy ;  Surgeon: Almond Lint, MD;  Location: [facility] OR;  Service: General;  Laterality: Left;   skin tags removed     breast, panty line, neckline   TOE SURGERY     preventative crossover toe surg/right foot   TOE SURGERY  2009   left foot/screw  in 2nd toe   TONSILLECTOMY     UPPER GASTROINTESTINAL ENDOSCOPY      Prior to Admission medications   Medication Sig Start Date End Date Taking? Authorizing Provider  Calcium Carbonate-Vitamin D (CALCIUM-VITAMIN D3 PO) Take 1 tablet by mouth in the morning and at bedtime.   Yes [provider]  Cholecalciferol (VITAMIN D3) 50 MCG (2000 UT) TABS Take 2 tablets by mouth daily.   Yes [provider]  DULoxetine (CYMBALTA) 60 MG capsule Take 1 capsule (60  mg total) by mouth daily. 01/10/23  Yes Stasia Cavalier, MD  esomeprazole (NEXIUM) 40 MG capsule Take 1 capsule (40 mg total) by mouth daily at 12 noon. Patient taking differently: Take 40 mg by mouth every morning. 02/22/23  Yes Nafziger, Kandee Keen, NP  famotidine (PEPCID) 40 MG tablet Take 1 tablet (40 mg total) by mouth at bedtime. 01/17/23  Yes Unk Lightning, PA  levothyroxine (SYNTHROID) 100 MCG tablet TAKE ONE TABLET BY MOUTH DAILY 05/19/22  Yes Nafziger, Kandee Keen, NP  montelukast (SINGULAIR) 10 MG tablet TAKE 1 TABLET BY MOUTH AT BEDTIME 11/03/22  Yes Nafziger, Kandee Keen, NP  nitrofurantoin, macrocrystal-monohydrate, (MACROBID) 100 MG capsule Take 1 capsule (100 mg total) by mouth at bedtime. 09/21/22  Yes Nafziger, Kandee Keen, NP  omega-3 acid ethyl esters (LOVAZA) 1 g capsule TAKE ONE CAPSULE BY MOUTH TWICE A DAY 08/26/22  Yes Nafziger, Kandee Keen, NP  Polyethylene Glycol 3350 (MIRALAX PO) Take by mouth. 1/2 capful every morning   Yes [provider]  rosuvastatin (CRESTOR) 20 MG tablet Take 1 tablet (20 mg total) by mouth daily. Patient taking differently: Take 20 mg by mouth at bedtime. 09/02/22  Yes Nafziger, Kandee Keen, NP  Bacillus Coagulans-Inulin (PROBIOTIC) 1-250 BILLION-MG CAPS Take by mouth. Patient not taking: Reported on 03/22/2023    [provider]  butalbital-acetaminophen-caffeine (FIORICET) [phone] MG tablet Take 1 tablet by mouth as needed for headache. 01/27/22   Nafziger, Kandee Keen, NP  cephALEXin (KEFLEX) 500 MG capsule Take 1 capsule (500 mg total) by mouth 2 (two) times daily for 7 days. Take for 7 days Patient not taking: Reported on 03/22/2023 03/15/23 03/22/23  Ardith Dark, MD  diazepam (VALIUM) 5 MG tablet Take 5 mg by mouth every 6 (six) hours as needed for anxiety. As needed (flying & dental)    [provider]  pregabalin (LYRICA) 150 MG capsule Take 1 capsule (150 mg total) by mouth 2 (two) times daily. Patient taking differently: Take 150 mg by mouth daily. 09/13/22  12/12/22  Nelwyn Salisbury, MD  Vibegron 75 MG TABS Take 1 tablet (75 mg total) by mouth daily. 03/17/23   Selmer Dominion, NP    Current Outpatient Medications  Medication Sig Dispense Refill   Calcium Carbonate-Vitamin D (CALCIUM-VITAMIN D3 PO) Take 1 tablet by mouth in the morning and at bedtime.     Cholecalciferol (VITAMIN D3) 50 MCG (2000 UT) TABS Take 2 tablets by mouth daily.     DULoxetine (CYMBALTA) 60 MG capsule Take 1 capsule (60 mg total) by mouth daily. 90 capsule 1   esomeprazole (NEXIUM) 40  MG capsule Take 1 capsule (40 mg total) by mouth daily at 12 noon. (Patient taking differently: Take 40 mg by mouth every morning.) 365 capsule 0   famotidine (PEPCID) 40 MG tablet Take 1 tablet (40 mg total) by mouth at bedtime. 90 tablet 0   levothyroxine (SYNTHROID) 100 MCG tablet TAKE ONE TABLET BY MOUTH DAILY 365 tablet 0   montelukast (SINGULAIR) 10 MG tablet TAKE 1 TABLET BY MOUTH AT BEDTIME 90 tablet 1   nitrofurantoin, macrocrystal-monohydrate, (MACROBID) 100 MG capsule Take 1 capsule (100 mg total) by mouth at bedtime. 365 capsule 0   omega-3 acid ethyl esters (LOVAZA) 1 g capsule TAKE ONE CAPSULE BY MOUTH TWICE A DAY 180 capsule 3   Polyethylene Glycol 3350 (MIRALAX PO) Take by mouth. 1/2 capful every morning     rosuvastatin (CRESTOR) 20 MG tablet Take 1 tablet (20 mg total) by mouth daily. (Patient taking differently: Take 20 mg by mouth at bedtime.) 365 tablet 0   Bacillus Coagulans-Inulin (PROBIOTIC) 1-250 BILLION-MG CAPS Take by mouth. (Patient not taking: Reported on 03/22/2023)     butalbital-acetaminophen-caffeine (FIORICET) 50-325-40 MG tablet Take 1 tablet by mouth as needed for headache. 30 tablet 2   cephALEXin (KEFLEX) 500 MG capsule Take 1 capsule (500 mg total) by mouth 2 (two) times daily for 7 days. Take for 7 days (Patient not taking: Reported on 03/22/2023) 14 capsule 0   diazepam (VALIUM) 5 MG tablet Take 5 mg by mouth every 6 (six) hours as needed for anxiety. As needed  (flying & dental)     pregabalin (LYRICA) 150 MG capsule Take 1 capsule (150 mg total) by mouth 2 (two) times daily. (Patient taking differently: Take 150 mg by mouth daily.) 180 capsule 0   Vibegron 75 MG TABS Take 1 tablet (75 mg total) by mouth daily. 30 tablet 5   Current Facility-Administered Medications  Medication Dose Route Frequency Provider Last Rate Last Admin   0.9 %  sodium chloride infusion  500 mL Intravenous Once Claudette Head T, MD       0.9 %  sodium chloride infusion  500 mL Intravenous Once Napoleon Form, MD        Allergies as of 03/22/2023   (No Known Allergies)    Family History  Problem Relation Age of Onset   Stroke Mother        died age 29   Diabetes Mother    Breast cancer Mother 39   Breast cancer Sister 60   Brain cancer Maternal Uncle 8   Breast cancer Paternal Aunt 60   Breast cancer Paternal Aunt        dx in her 72s   Cancer Maternal Grandmother        intra-abdominal cancer   Diabetes Maternal Grandfather    Breast cancer Paternal Grandmother 74   Brain cancer Cousin 66       maternal cousin   Brain cancer Cousin 20       paternal cousin   Colon cancer Neg Hx    Colon polyps Neg Hx    Crohn's disease Neg Hx    Esophageal cancer Neg Hx    Rectal cancer Neg Hx    Stomach cancer Neg Hx    Ulcerative colitis Neg Hx     Social History   Socioeconomic History   Marital status: Married    Spouse name: [name]   Number of children: 3   Years of education: Not on file   Highest  education level: GED or equivalent  Occupational History   Occupation: retired Catering manager  Tobacco Use   Smoking status: Former    Packs/day: 0.10    Years: 2.00    Additional pack years: 0.00    Total pack years: 0.20    Types: Cigarettes    Start date: 11/16/1959    Quit date: 11/15/1960    Years since quitting: 62.3    Passive exposure: Past (over 58 years ago)   Smokeless tobacco: Never  Vaping Use   Vaping Use: Never used  Substance and Sexual  Activity   Alcohol use: Yes    Comment: rarely   Drug use: No   Sexual activity: Yes    Comment: menarche age 77, fist live birth 63, P 3, hysterectomy age 50, no HRT, BCP 2 yrs  Other Topics Concern   Not on file  Social History Narrative   Occupation:  Retired Catering manager    Married with 3 grown children      Never Smoked     Alcohol use-no        Social Determinants of Health   Financial Resource Strain: Low Risk  (02/09/2023)   Overall Financial Resource Strain (CARDIA)    Difficulty of Paying Living Expenses: Not hard at all  Food Insecurity: No Food Insecurity (02/09/2023)   Hunger Vital Sign    Worried About Running Out of Food in the Last Year: Never true    Ran Out of Food in the Last Year: Never true  Transportation Needs: No Transportation Needs (02/09/2023)   PRAPARE - Administrator, Civil Service (Medical): No    Lack of Transportation (Non-Medical): No  Physical Activity: Inactive (02/09/2023)   Exercise Vital Sign    Days of Exercise per Week: 0 days    Minutes of Exercise per Session: 0 min  Stress: No Stress Concern Present (02/09/2023)   [employer] of Occupational Health - Occupational Stress Questionnaire    Feeling of Stress : Not at all  Social Connections: Socially Integrated (02/09/2023)   Social Connection and Isolation Panel [NHANES]    Frequency of Communication with Friends and Family: More than three times a week    Frequency of Social Gatherings with Friends and Family: More than three times a week    Attends Religious Services: More than 4 times per year    Active Member of [employer] or Organizations: Yes    Attends Engineer, structural: More than 4 times per year    Marital Status: Married  Catering manager Violence: Not At Risk (02/09/2023)   Humiliation, Afraid, Rape, and Kick questionnaire    Fear of Current or Ex-Partner: No    Emotionally Abused: No    Physically Abused: No    Sexually Abused: No    Review of  Systems:  All other review of systems negative except as mentioned in the HPI.  Physical Exam: Vital signs in last 24 hours: Blood Pressure (Abnormal) 133/93   Pulse 90   Height 4\' 11"  (1.499 m)   Weight 178 lb (80.7 kg)   Oxygen Saturation 96%   Body Mass Index 35.95 kg/m  General:   Alert, NAD Lungs:  Clear .   Heart:  Regular rate and rhythm Abdomen:  Soft, nontender and nondistended. Neuro/Psych:  Alert and cooperative. Normal mood and affect. A and O x 3  Reviewed labs, radiology imaging, old records and pertinent past GI work up  Patient is appropriate for planned procedure(s) and anesthesia  in an ambulatory setting   K. Denzil Magnuson , MD [phone]

## 2023-03-22 NOTE — Progress Notes (Signed)
Called to room to assist during endoscopic procedure.  Patient ID and intended procedure confirmed with present staff. Received instructions for my participation in the procedure from the performing physician.  

## 2023-03-22 NOTE — Patient Instructions (Addendum)
-   Resume previous diet. - Continue present medications. - Await pathology results. - Repeat colonoscopy date to be determined after pending pathology results are reviewed for retreatment. - Return to GI clinic at appointment to be scheduled with Dr Meridee Score or Dr Russella Dar to discuss possible EMR for removal of large ascending colon lesion.  YOU HAD AN ENDOSCOPIC PROCEDURE TODAY AT THE [facility]:   Refer to the procedure report that was given to you for any specific questions about what was found during the examination.  If the procedure report does not answer your questions, please call your gastroenterologist to clarify.  If you requested that your care partner not be given the details of your procedure findings, then the procedure report has been included in a sealed envelope for you to review at your convenience later.  YOU SHOULD EXPECT: Some feelings of bloating in the abdomen. Passage of more gas than usual.  Walking can help get rid of the air that was put into your GI tract during the procedure and reduce the bloating. If you had a lower endoscopy (such as a colonoscopy or flexible sigmoidoscopy) you may notice spotting of blood in your stool or on the toilet paper. If you underwent a bowel prep for your procedure, you may not have a normal bowel movement for a few days.  Please Note:  You might notice some irritation and congestion in your nose or some drainage.  This is from the oxygen used during your procedure.  There is no need for concern and it should clear up in a day or so.  SYMPTOMS TO REPORT IMMEDIATELY:  Following lower endoscopy (colonoscopy or flexible sigmoidoscopy):  Excessive amounts of blood in the stool  Significant tenderness or worsening of abdominal pains  Swelling of the abdomen that is new, acute  Fever of 100F or higher  For urgent or emergent issues, a gastroenterologist can be reached at any hour by calling [phone]. Do not use MyChart  messaging for urgent concerns.    DIET:  We do recommend a small meal at first, but then you may proceed to your regular diet.  Drink plenty of fluids but you should avoid alcoholic beverages for 24 hours.  ACTIVITY:  You should plan to take it easy for the rest of today and you should NOT DRIVE or use heavy machinery until tomorrow (because of the sedation medicines used during the test).    FOLLOW UP: Our staff will call the number listed on your records the next business day following your procedure.  We will call around 7:15- 8:00 am to check on you and address any questions or concerns that you may have regarding the information given to you following your procedure. If we do not reach you, we will leave a message.     If any biopsies were taken you will be contacted by phone or by letter within the next 1-3 weeks.  Please call us at [phone] if you have not heard about the biopsies in 3 weeks.    SIGNATURES/CONFIDENTIALITY: You and/or your care partner have signed paperwork which will be entered into your electronic medical record.  These signatures attest to the fact that that the information above on your After Visit Summary has been reviewed and is understood.  Full responsibility of the confidentiality of this discharge information lies with you and/or your care-partner.

## 2023-03-23 ENCOUNTER — Encounter: Payer: Self-pay | Admitting: Neurology

## 2023-03-23 ENCOUNTER — Telehealth: Payer: Self-pay | Admitting: *Deleted

## 2023-03-23 NOTE — Telephone Encounter (Signed)
  Follow up Call-     03/22/2023   10:26 AM 03/21/2023   10:16 AM  Call back number  Post procedure Call Back phone  # cell phone [phone]- [name] (husband)  Permission to leave phone message Yes Yes     Patient questions:  Do you have a fever, pain , or abdominal swelling? No. Pain Score  0 *  Have you tolerated food without any problems? Yes.    Have you been able to return to your normal activities? Yes.    Do you have any questions about your discharge instructions: Diet   No. Medications  No. Follow up visit  No.  Do you have questions or concerns about your Care? No.  Actions: * If pain score is 4 or above: No action needed, pain <4.

## 2023-03-25 ENCOUNTER — Encounter: Payer: Self-pay | Admitting: Neurology

## 2023-03-28 ENCOUNTER — Ambulatory Visit
Admission: RE | Admit: 2023-03-28 | Discharge: 2023-03-28 | Disposition: A | Discharge status: Home / Self Care | Payer: Medicare HMO | Source: Ambulatory Visit | Attending: Neurology | Admitting: Neurology

## 2023-03-28 ENCOUNTER — Encounter: Payer: Self-pay | Admitting: Gastroenterology

## 2023-03-28 DIAGNOSIS — I639 Cerebral infarction, unspecified: Secondary | ICD-10-CM | POA: Diagnosis not present

## 2023-03-28 MED ORDER — GADOPICLENOL 0.5 MMOL/ML IV SOLN
8.0000 mL | Freq: Once | INTRAVENOUS | Status: AC | PRN
  Administered 2023-03-28: 8 mL via INTRAVENOUS

## 2023-03-29 ENCOUNTER — Telehealth: Payer: Self-pay

## 2023-03-29 ENCOUNTER — Other Ambulatory Visit: Payer: Self-pay

## 2023-03-29 DIAGNOSIS — Z86010 Personal history of colonic polyps: Secondary | ICD-10-CM

## 2023-03-29 MED ORDER — NA SULFATE-K SULFATE-MG SULF 17.5-3.13-1.6 GM/177ML PO SOLN: 1.0000 | Freq: Once | ORAL | 0 refills | Status: AC

## 2023-03-29 NOTE — Telephone Encounter (Signed)
Colon EMR scheduled, pt instructed and medications reviewed.  Patient instructions mailed to home.  Patient to call with any questions or concerns.  

## 2023-03-29 NOTE — Telephone Encounter (Signed)
Mansouraty, Netty Starring., MD  Meryl Dare, MD; Loretha Stapler, RN; Napoleon Form, MD MS, Thanks for the update.  Manly Nestle, Please schedule this patient for clinic visit.  Please schedule this patient for colonoscopy with EMR 90-minute slot.  Thanks. GM

## 2023-03-29 NOTE — Telephone Encounter (Signed)
Colon EMR has been scheduled for 06/28/23 at 8 am at [facility] with GM   ROV has been scheduled for 06/15/23 at 230 pm with GM  Left message on machine to call back

## 2023-03-30 ENCOUNTER — Ambulatory Visit (INDEPENDENT_AMBULATORY_CARE_PROVIDER_SITE_OTHER): Payer: Medicare HMO | Admitting: Neurology

## 2023-03-30 DIAGNOSIS — R4182 Altered mental status, unspecified: Secondary | ICD-10-CM | POA: Diagnosis not present

## 2023-03-30 DIAGNOSIS — R413 Other amnesia: Secondary | ICD-10-CM

## 2023-04-01 NOTE — Progress Notes (Signed)
Kindly inform the patient that MR angiogram study of the blood vessels of the brain did not show any major blockages to worry about.

## 2023-04-01 NOTE — Progress Notes (Signed)
Kindly inform the patient that MR angiogram study of the neck blood vessels shows only minor plaque at the origin of the right carotid but no major blockages to worry about.

## 2023-04-05 NOTE — Telephone Encounter (Signed)
Inbound call from patient's husband, states they can not do early morning appointments for procedures, would like to reschedule EMR scheduled at [facility].

## 2023-04-05 NOTE — Telephone Encounter (Signed)
The pt husband has requested appt to be moved to a later time and day. Appt moved to 07/25/23 at 930 am. New instructions sent. The pt will keep office visit as scheduled

## 2023-04-08 ENCOUNTER — Encounter: Payer: Self-pay | Admitting: Adult Health

## 2023-04-12 ENCOUNTER — Ambulatory Visit (INDEPENDENT_AMBULATORY_CARE_PROVIDER_SITE_OTHER): Payer: Medicare HMO | Admitting: Adult Health

## 2023-04-12 ENCOUNTER — Encounter: Payer: Self-pay | Admitting: Adult Health

## 2023-04-12 VITALS — BP 110/74 | HR 85 | Temp 98.0°F | Ht 59.0 in | Wt 177.4 lb

## 2023-04-12 DIAGNOSIS — R350 Frequency of micturition: Secondary | ICD-10-CM

## 2023-04-12 DIAGNOSIS — L7682 Other postprocedural complications of skin and subcutaneous tissue: Secondary | ICD-10-CM | POA: Diagnosis not present

## 2023-04-12 DIAGNOSIS — R5383 Other fatigue: Secondary | ICD-10-CM

## 2023-04-12 LAB — POC URINALSYSI DIPSTICK (AUTOMATED)
Bilirubin, UA: NEGATIVE
Blood, UA: NEGATIVE
Glucose, UA: NEGATIVE
Ketones, UA: NEGATIVE
Leukocytes, UA: NEGATIVE
Nitrite, UA: NEGATIVE
Protein, UA: NEGATIVE
Spec Grav, UA: 1.015 (ref 1.010–1.025)
Urobilinogen, UA: 0.2 E.U./dL
pH, UA: 6.5 (ref 5.0–8.0)

## 2023-04-12 MED ORDER — PREGABALIN 75 MG PO CAPS
75.0000 mg | ORAL_CAPSULE | Freq: Every day | ORAL | 0 refills | Status: DC
Start: 2023-04-12 — End: 2023-04-27

## 2023-04-12 NOTE — Progress Notes (Signed)
Subjective:    [name], female    DOB: 1941/05/07, 82 y.o.   MRN: [id number]  HPI 82 year old female who  has a past medical history of Adenomatous colon polyp, Anxiety, Arthritis, Breast cancer (HCC) (08/08/2013), Cataract, Chronic insomnia, Cluster headaches, Depression, Diverticulosis, Fatty liver (2011), Fibromyalgia, GERD (gastroesophageal reflux disease), H/O hiatal hernia, History of transfusion (08/30/2013), radiation therapy (10/29/13- 12/14/13), Hypothyroidism, Internal hemorrhoids, Irritable bowel syndrome, Kidney stone, Lymphedema, Macular degeneration, MDD (major depressive disorder), Osteopenia, Osteopenia, Other abnormal glucose, Other and unspecified hyperlipidemia, Pain in joint, shoulder region, Pneumonia (4098,1191), Sleep apnea, and Stress incontinence, female.  She presents to the office today with her husband   She reports that she will wake up in the morning and eat breakfast and then go back to bed for 4-5 hours. She feels as though she has no energy or stamina and feels clammy all the time. She is urinating about 12-15 times a day due to overactive bladder, she is working with UroGYN  on this issue. Most recently prescribed Vibegron but did not take this due to cost.   The last two days have bee better       Review of Systems See HPI   Past Medical History:  Diagnosis Date   Adenomatous colon polyp    Anxiety    PHOBIAS   Arthritis    Breast cancer (HCC) 08/08/2013   right LOQ   Cataract    Chronic insomnia    Cluster headaches    history of migraines / NONE FOR SEVERAL YRS   Depression    Diverticulosis    Fatty liver 2011   Fibromyalgia    GERD (gastroesophageal reflux disease)    H/O hiatal hernia    History of transfusion 08/30/2013   Hx of radiation therapy 10/29/13- 12/14/13   right chest wall 5040 cGy 28 sessions, right supraclavicular/axillary region 5040 cGy 28 sessions, right chest wall boost 1000 cGy 5 sessions    Hypothyroidism    Internal hemorrhoids    Irritable bowel syndrome    Kidney stone    Lymphedema    RT ARM - WEARS SLEEVE   Macular degeneration    hole/right eye   MDD (major depressive disorder)    Osteopenia    Osteopenia    Other abnormal glucose    Other and unspecified hyperlipidemia    Pain in joint, shoulder region    Pneumonia 4782,9562   Sleep apnea    USES C-PAP   Stress incontinence, female     Social History   Socioeconomic History   Marital status: Married    Spouse name: [name]   Number of children: 3   Years of education: Not on file   Highest education level: GED or equivalent  Occupational History   Occupation: retired Catering manager  Tobacco Use   Smoking status: Former    Packs/day: 0.10    Years: 2.00    Additional pack years: 0.00    Total pack years: 0.20    Types: Cigarettes    Start date: 11/16/1959    Quit date: 11/15/1960    Years since quitting: 62.4    Passive exposure: Past (over 58 years ago)   Smokeless tobacco: Never  Vaping Use   Vaping Use: Never used  Substance and Sexual Activity   Alcohol use: Yes    Comment: rarely   Drug use: No   Sexual activity: Yes    Comment: menarche age 46, fist live birth 10, P  3, hysterectomy age 63, no HRT, BCP 2 yrs  Other Topics Concern   Not on file  Social History Narrative   Occupation:  Retired Catering manager    Married with 3 grown children      Never Smoked     Alcohol use-no        Social Determinants of Health   Financial Resource Strain: Low Risk  (02/09/2023)   Overall Financial Resource Strain (CARDIA)    Difficulty of Paying Living Expenses: Not hard at all  Food Insecurity: No Food Insecurity (02/09/2023)   Hunger Vital Sign    Worried About Running Out of Food in the Last Year: Never true    Ran Out of Food in the Last Year: Never true  Transportation Needs: No Transportation Needs (02/09/2023)   PRAPARE - Administrator, Civil Service (Medical): No    Lack of  Transportation (Non-Medical): No  Physical Activity: Inactive (02/09/2023)   Exercise Vital Sign    Days of Exercise per Week: 0 days    Minutes of Exercise per Session: 0 min  Stress: No Stress Concern Present (02/09/2023)   [employer] of Occupational Health - Occupational Stress Questionnaire    Feeling of Stress : Not at all  Social Connections: Socially Integrated (02/09/2023)   Social Connection and Isolation Panel [NHANES]    Frequency of Communication with Friends and Family: More than three times a week    Frequency of Social Gatherings with Friends and Family: More than three times a week    Attends Religious Services: More than 4 times per year    Active Member of Clubs or Organizations: Yes    Attends Banker Meetings: More than 4 times per year    Marital Status: Married  Catering manager Violence: Not At Risk (02/09/2023)   Humiliation, Afraid, Rape, and Kick questionnaire    Fear of Current or Ex-Partner: No    Emotionally Abused: No    Physically Abused: No    Sexually Abused: No    Past Surgical History:  Procedure Laterality Date   ABDOMINAL HYSTERECTOMY     APPENDECTOMY     BILATERAL TOTAL MASTECTOMY WITH AXILLARY LYMPH NODE DISSECTION  08/30/2013   Dr Donell Beers   BREAST CYST ASPIRATION     9 cysts   CATARACT EXTRACTION, BILATERAL  2005/2007   CHOLECYSTECTOMY     COLONOSCOPY     CYSTOCELE REPAIR     EVACUATION BREAST HEMATOMA Left 08/31/2013   Procedure: EVACUATION HEMATOMA BREAST;  Surgeon: Almond Lint, MD;  Location: [facility];  Service: General;  Laterality: Left;   EYE SURGERY     to repair macular hole   FOOT ARTHROPLASTY     lt    GANGLION CYST EXCISION     rt foot   HEMORRHOID SURGERY     03/1993   JOINT REPLACEMENT  03/15/11   left knee replacement   KNEE ARTHROSCOPY     /partial knee 2016/left knee 2012   MASS EXCISION  11/04/2011   Procedure: EXCISION MASS;  Surgeon: Wyn Forster., MD;  Location: [facility];   Service: Orthopedics;  Laterality: Right;  excisional biopsy right ulna mass   MASTECTOMY W/ SENTINEL NODE BIOPSY Right 08/30/2013   Procedure: RIGHT  AXILLARY SENTINEL LYMPH NODE BIOPSY; Right Axillary Node Disection;  Surgeon: Almond Lint, MD;  Location: [facility];  Service: General;  Laterality: Right;  Right side nuc med 7:00    PARTIAL KNEE ARTHROPLASTY Right  11/03/2015   Procedure: RIGHT KNEE MEDIAL UNICOMPARTMENTAL ARTHROPLASTY ;  Surgeon: Ollen Gross, MD;  Location: [facility];  Service: Orthopedics;  Laterality: Right;   RECTOCELE REPAIR     SIMPLE MASTECTOMY WITH AXILLARY SENTINEL NODE BIOPSY Left 08/30/2013   Procedure: Bilateral Breast Mastectomy ;  Surgeon: Almond Lint, MD;  Location: [facility];  Service: General;  Laterality: Left;   skin tags removed     breast, panty line, neckline   TOE SURGERY     preventative crossover toe surg/right foot   TOE SURGERY  2009   left foot/screw  in 2nd toe   TONSILLECTOMY     UPPER GASTROINTESTINAL ENDOSCOPY      Family History  Problem Relation Age of Onset   Stroke Mother        died age 24   Diabetes Mother    Breast cancer Mother 11   Breast cancer Sister 71   Brain cancer Maternal Uncle 8   Breast cancer Paternal Aunt 3   Breast cancer Paternal Aunt        dx in her 29s   Cancer Maternal Grandmother        intra-abdominal cancer   Diabetes Maternal Grandfather    Breast cancer Paternal Grandmother 19   Brain cancer Cousin 72       maternal cousin   Brain cancer Cousin 20       paternal cousin   Colon cancer Neg Hx    Colon polyps Neg Hx    Crohn's disease Neg Hx    Esophageal cancer Neg Hx    Rectal cancer Neg Hx    Stomach cancer Neg Hx    Ulcerative colitis Neg Hx     No Known Allergies  Current Outpatient Medications on File Prior to Visit  Medication Sig Dispense Refill   Bacillus Coagulans-Inulin (PROBIOTIC) 1-250 BILLION-MG CAPS Take by mouth. (Patient not taking: Reported on 03/22/2023)      butalbital-acetaminophen-caffeine (FIORICET) 50-325-40 MG tablet Take 1 tablet by mouth as needed for headache. 30 tablet 2   Calcium Carbonate-Vitamin D (CALCIUM-VITAMIN D3 PO) Take 1 tablet by mouth in the morning and at bedtime.     Cholecalciferol (VITAMIN D3) 50 MCG (2000 UT) TABS Take 2 tablets by mouth daily.     diazepam (VALIUM) 5 MG tablet Take 5 mg by mouth every 6 (six) hours as needed for anxiety. As needed (flying & dental)     DULoxetine (CYMBALTA) 60 MG capsule Take 1 capsule (60 mg total) by mouth daily. 90 capsule 1   esomeprazole (NEXIUM) 40 MG capsule Take 1 capsule (40 mg total) by mouth daily at 12 noon. (Patient taking differently: Take 40 mg by mouth every morning.) 365 capsule 0   famotidine (PEPCID) 40 MG tablet Take 1 tablet (40 mg total) by mouth at bedtime. 90 tablet 0   levothyroxine (SYNTHROID) 100 MCG tablet TAKE ONE TABLET BY MOUTH DAILY 365 tablet 0   montelukast (SINGULAIR) 10 MG tablet TAKE 1 TABLET BY MOUTH AT BEDTIME 90 tablet 1   nitrofurantoin, macrocrystal-monohydrate, (MACROBID) 100 MG capsule Take 1 capsule (100 mg total) by mouth at bedtime. 365 capsule 0   omega-3 acid ethyl esters (LOVAZA) 1 g capsule TAKE ONE CAPSULE BY MOUTH TWICE A DAY 180 capsule 3   Polyethylene Glycol 3350 (MIRALAX PO) Take by mouth. 1/2 capful every morning     pregabalin (LYRICA) 150 MG capsule Take 1 capsule (150 mg total) by mouth 2 (two) times daily. (Patient  taking differently: Take 150 mg by mouth daily.) 180 capsule 0   rosuvastatin (CRESTOR) 20 MG tablet Take 1 tablet (20 mg total) by mouth daily. (Patient taking differently: Take 20 mg by mouth at bedtime.) 365 tablet 0   Vibegron 75 MG TABS Take 1 tablet (75 mg total) by mouth daily. 30 tablet 5   No current facility-administered medications on file prior to visit.    BP 110/74 (BP Location: Left Arm, Patient Position: Sitting, Cuff Size: Large)   Pulse 85   Temp 98 F (36.7 C) (Oral)   Ht 4\' 11"  (1.499 m)   Wt  177 lb 6.4 oz (80.5 kg)   SpO2 98%   BMI 35.83 kg/m       Objective:   Physical Exam Vitals and nursing note reviewed.  Constitutional:      Appearance: Normal appearance.  Cardiovascular:     Rate and Rhythm: Normal rate and regular rhythm.     Pulses: Normal pulses.     Heart sounds: Normal heart sounds.  Pulmonary:     Effort: Pulmonary effort is normal.     Breath sounds: Normal breath sounds.  Musculoskeletal:        General: Normal range of motion.  Skin:    General: Skin is warm and dry.  Neurological:     General: No focal deficit present.     Mental Status: She is alert and oriented to person, place, and time.  Psychiatric:        Mood and Affect: Mood normal.        Behavior: Behavior normal.        Thought Content: Thought content normal.        Judgment: Judgment normal.       Assessment & Plan:  1. Frequent urination - Negative for UTI - POCT Urinalysis Dipstick (Automated) - due to Overactive bladder.  - Follow up with Uro GYN  2. Other fatigue - Consider polypharmacy. Will decrease Lyrica that she takes for incisional pain from mastectomy from 150 mg to 75 mg. Consider having psychiatry reducing Cymbalta.  - She can also stop taking Singulair at this time to see if that helps. If cough/allergy symptoms come back then restart Singulair.  - Follow up in 2 weeks   - Vitamin B12; Future - IBC + Ferritin; Future - CBC with Differential/Platelet; Future - Comprehensive metabolic panel; Future  3. Incisional pain  - pregabalin (LYRICA) 75 MG capsule; Take 1 capsule (75 mg total) by mouth daily.  Dispense: 30 capsule; Refill: 0  Shirline Frees, NP  Time spent with patient today was 33  minutes which consisted of chart review, discussing OAB and fatigue, work up, treatment answering questions and documentation.

## 2023-04-12 NOTE — Patient Instructions (Addendum)
Stop taking Vitamin D - continue Viactive   You can try stopping Singulair.   I am going to cut your Lyrica to 75 mg daily.   Let me know in 2 weeks

## 2023-04-13 ENCOUNTER — Other Ambulatory Visit: Payer: Self-pay | Admitting: Adult Health

## 2023-04-13 ENCOUNTER — Other Ambulatory Visit (INDEPENDENT_AMBULATORY_CARE_PROVIDER_SITE_OTHER): Payer: Medicare HMO

## 2023-04-13 ENCOUNTER — Encounter: Payer: Self-pay | Admitting: Adult Health

## 2023-04-13 DIAGNOSIS — R5383 Other fatigue: Secondary | ICD-10-CM | POA: Diagnosis not present

## 2023-04-13 LAB — COMPREHENSIVE METABOLIC PANEL
ALT: 18 U/L (ref 0–35)
AST: 15 U/L (ref 0–37)
Albumin: 4.2 g/dL (ref 3.5–5.2)
Alkaline Phosphatase: 58 U/L (ref 39–117)
BUN: 18 mg/dL (ref 6–23)
CO2: 27 mEq/L (ref 19–32)
Calcium: 9.3 mg/dL (ref 8.4–10.5)
Chloride: 102 mEq/L (ref 96–112)
Creatinine, Ser: 0.66 mg/dL (ref 0.40–1.20)
GFR: 83.09 mL/min (ref >60.00)
Glucose, Bld: 165 mg/dL — ABNORMAL HIGH (ref 70–99)
Potassium: 4 mEq/L (ref 3.5–5.1)
Sodium: 138 mEq/L (ref 135–145)
Total Bilirubin: 0.7 mg/dL (ref 0.2–1.2)
Total Protein: 6.8 g/dL (ref 6.0–8.3)

## 2023-04-13 LAB — IBC + FERRITIN
Ferritin: 115.1 ng/mL (ref 10.0–291.0)
Iron: 74 ug/dL (ref 42–145)
Saturation Ratios: 21.4 % (ref 20.0–50.0)
TIBC: 345.8 ug/dL (ref 250.0–450.0)
Transferrin: 247 mg/dL (ref 212.0–360.0)

## 2023-04-13 LAB — CBC WITH DIFFERENTIAL/PLATELET
Basophils Absolute: 0 10*3/uL (ref 0.0–0.1)
Basophils Relative: 0.4 % (ref 0.0–3.0)
Eosinophils Absolute: 0.2 10*3/uL (ref 0.0–0.7)
Eosinophils Relative: 2.7 % (ref 0.0–5.0)
HCT: 45 % (ref 36.0–46.0)
Hemoglobin: 14.9 g/dL (ref 12.0–15.0)
Lymphocytes Relative: 22.9 % (ref 12.0–46.0)
Lymphs Abs: 1.7 10*3/uL (ref 0.7–4.0)
MCHC: 33.1 g/dL (ref 30.0–36.0)
MCV: 89.9 fl (ref 78.0–100.0)
Monocytes Absolute: 0.5 10*3/uL (ref 0.1–1.0)
Monocytes Relative: 6.8 % (ref 3.0–12.0)
Neutro Abs: 5 10*3/uL (ref 1.4–7.7)
Neutrophils Relative %: 67.2 % (ref 43.0–77.0)
Platelets: 198 10*3/uL (ref 150.0–400.0)
RBC: 5 Mil/uL (ref 3.87–5.11)
RDW: 14.3 % (ref 11.5–15.5)
WBC: 7.5 10*3/uL (ref 4.0–10.5)

## 2023-04-13 LAB — VITAMIN B12: Vitamin B-12: >1500 pg/mL — ABNORMAL HIGH (ref 211–911)

## 2023-04-13 MED ORDER — METFORMIN HCL 500 MG PO TABS: 500.0000 mg | ORAL_TABLET | Freq: Every day | ORAL | 0 refills | Status: DC

## 2023-04-13 NOTE — Progress Notes (Signed)
Kindly inform the patient that EEG study was normal

## 2023-04-13 NOTE — Progress Notes (Unsigned)
[facility] Return Visit  SUBJECTIVE  History of Present Illness: Deyla Tebbe is a 82 y.o. female seen in follow-up for OAB and rUTI symptoms. Plan at last visit was start vaginal estrogen cream and Gemtesa 75mg  daily for OAB.   Patient tried Leslye Peer for x4 weeks and noticed no improvements in her symptoms. Reports she was still going 11-20 times a day. She and her husband are working on weaning her off medication to decrease her polypharmacy in an attempt to reduce brain fog. He reports they have seen a neurology and she does not have dementia.   Has not been using the estrogen cream due to her preference.    Past Medical History: Patient  has a past medical history of Adenomatous colon polyp, Anxiety, Arthritis, Breast cancer (HCC) (08/08/2013), Cataract, Chronic insomnia, Cluster headaches, Depression, Diverticulosis, Fatty liver (2011), Fibromyalgia, GERD (gastroesophageal reflux disease), H/O hiatal hernia, History of transfusion (08/30/2013), radiation therapy (10/29/13- 12/14/13), Hypothyroidism, Internal hemorrhoids, Irritable bowel syndrome, Kidney stone, Lymphedema, Macular degeneration, MDD (major depressive disorder), Osteopenia, Osteopenia, Other abnormal glucose, Other and unspecified hyperlipidemia, Pain in joint, shoulder region, Pneumonia (0981,1914), Sleep apnea, and Stress incontinence, female.   Past Surgical History: She  has a past surgical history that includes Appendectomy; Cholecystectomy; Abdominal hysterectomy; Tonsillectomy; Eye surgery; Cataract extraction, bilateral (2005/2007); Knee arthroscopy; Joint replacement (03/15/11); Colonoscopy; Ganglion cyst excision; Foot arthroplasty; Mass excision (11/04/2011); Hemorrhoid surgery; Breast cyst aspiration; Toe Surgery; Toe Surgery (2009); Upper gastrointestinal endoscopy; skin tags removed; Bilateral total mastectomy with axillary lymph node dissection (08/30/2013); Simple mastectomy with axillary sentinel node  biopsy (Left, 08/30/2013); Mastectomy w/ sentinel node biopsy (Right, 08/30/2013); Evacuation breast hematoma (Left, 08/31/2013); Partial knee arthroplasty (Right, 11/03/2015); Cystocele repair; and Rectocele repair.   Medications: She has a current medication list which includes the following prescription(s): metformin, probiotic, butalbital-acetaminophen-caffeine, calcium carbonate-vitamin d, diazepam, duloxetine, esomeprazole, famotidine, levothyroxine, montelukast, nitrofurantoin (macrocrystal-monohydrate), omega-3 acid ethyl esters, polyethylene glycol 3350, pregabalin, rosuvastatin, and vibegron.   Allergies: Patient has No Known Allergies.   Social History: Patient  reports that she quit smoking about 62 years ago. Her smoking use included cigarettes. She started smoking about 63 years ago. She has a 0.20 pack-year smoking history. She has been exposed to tobacco smoke. She has never used smokeless tobacco. She reports current alcohol use. She reports that she does not use drugs.      OBJECTIVE     Physical Exam: Vitals:   04/14/23 1307  BP: (!) 123/93  Pulse: 80   Gen: No apparent distress, A&O x 3.  Detailed Urogynecologic Evaluation:  Deferred.    ASSESSMENT AND PLAN    Ms. Lawter is a 82 y.o. with:  1. Overactive bladder   2. Recurrent UTI    Patient reports the OAB is her current focus. She has tried and failed Trospium, Myrbetriq, Gemtesa, and is not a good candidate for other medications due to age. She would like to consider third line therapies. We discussed the options of SNM and bladder botox.  Has not had one recently and had a negative urine sample recently at PCP.   Plan for patient and her husband to discuss options and return for planning. We discussed the pros and cons of both options and that they should carefully consider both of them.   Patient to follow up in about 8 weeks for procedural discussion with Dr. Florian Buff.

## 2023-04-14 ENCOUNTER — Ambulatory Visit (INDEPENDENT_AMBULATORY_CARE_PROVIDER_SITE_OTHER): Payer: Medicare HMO | Admitting: Obstetrics and Gynecology

## 2023-04-14 ENCOUNTER — Ambulatory Visit: Payer: Medicare HMO | Admitting: Obstetrics and Gynecology

## 2023-04-14 ENCOUNTER — Encounter: Payer: Self-pay | Admitting: Obstetrics and Gynecology

## 2023-04-14 VITALS — BP 120/83 | HR 80

## 2023-04-14 DIAGNOSIS — N3281 Overactive bladder: Secondary | ICD-10-CM | POA: Diagnosis not present

## 2023-04-14 DIAGNOSIS — N390 Urinary tract infection, site not specified: Secondary | ICD-10-CM

## 2023-04-14 NOTE — Patient Instructions (Addendum)
Options for Overactive bladder:  Sacral Neuromodulation: Pro: Longer lasting Con: Trial phase, cannot swim during the first week, would need anesthesia for surgical decision.    Bladder Botox: Pro: In office, no anesthesia, less invasive Con: Only last 6 months would need repeat injection

## 2023-04-16 ENCOUNTER — Encounter: Payer: Self-pay | Admitting: Adult Health

## 2023-04-18 ENCOUNTER — Encounter: Payer: Self-pay | Admitting: Adult Health

## 2023-04-25 ENCOUNTER — Encounter: Payer: Self-pay | Admitting: Adult Health

## 2023-04-27 ENCOUNTER — Other Ambulatory Visit (HOSPITAL_COMMUNITY): Payer: Self-pay

## 2023-04-27 ENCOUNTER — Ambulatory Visit (INDEPENDENT_AMBULATORY_CARE_PROVIDER_SITE_OTHER): Payer: Medicare HMO | Admitting: Adult Health

## 2023-04-27 ENCOUNTER — Encounter: Payer: Self-pay | Admitting: Adult Health

## 2023-04-27 ENCOUNTER — Telehealth: Payer: Self-pay

## 2023-04-27 VITALS — BP 120/80 | HR 81 | Temp 97.7°F | Ht 59.0 in | Wt 179.0 lb

## 2023-04-27 DIAGNOSIS — R5383 Other fatigue: Secondary | ICD-10-CM

## 2023-04-27 DIAGNOSIS — R350 Frequency of micturition: Secondary | ICD-10-CM

## 2023-04-27 DIAGNOSIS — L7682 Other postprocedural complications of skin and subcutaneous tissue: Secondary | ICD-10-CM

## 2023-04-27 LAB — POCT URINALYSIS DIPSTICK
Bilirubin, UA: NEGATIVE
Blood, UA: NEGATIVE
Glucose, UA: NEGATIVE
Ketones, UA: NEGATIVE
Leukocytes, UA: NEGATIVE
Nitrite, UA: NEGATIVE
Protein, UA: POSITIVE — AB
Spec Grav, UA: 1.025 (ref 1.010–1.025)
Urobilinogen, UA: 0.2 E.U./dL
pH, UA: 6 (ref 5.0–8.0)

## 2023-04-27 MED ORDER — PREGABALIN 50 MG PO CAPS
50.0000 mg | ORAL_CAPSULE | Freq: Three times a day (TID) | ORAL | 0 refills | Status: DC
Start: 2023-04-27 — End: 2023-04-28

## 2023-04-27 NOTE — Telephone Encounter (Signed)
Patient Advocate Encounter  Prior Authorization for Pregabalin has been approved with [employer].    PA# [id number] Effective dates: 11/15/22 through 11/15/23

## 2023-04-27 NOTE — Telephone Encounter (Signed)
Pharmacy Patient Advocate Encounter   Received notification from Karin Golden that prior authorization for Pregabalin 50MG  capsules is required/requested.   PA submitted to [facility] via CoverMyMeds Key or (Medicaid) confirmation # [id number] Status is pending

## 2023-04-27 NOTE — Patient Instructions (Signed)
I am going to decrease your dose of Lyrica to 50 mg daily   I also want you to stop Crestor for the next 1-2 months to see if the foggy headed gets better.

## 2023-04-27 NOTE — Progress Notes (Addendum)
Subjective:    [name], female    DOB: 1941/10/01, 82 y.o.   MRN: [id number]  HPI 82 year old female who  has a past medical history of Adenomatous colon polyp, Anxiety, Arthritis, Breast cancer (HCC) (08/08/2013), Cataract, Chronic insomnia, Cluster headaches, Depression, Diverticulosis, Fatty liver (2011), Fibromyalgia, GERD (gastroesophageal reflux disease), H/O hiatal hernia, History of transfusion (08/30/2013), radiation therapy (10/29/13- 12/14/13), Hypothyroidism, Internal hemorrhoids, Irritable bowel syndrome, Kidney stone, Lymphedema, Macular degeneration, MDD (major depressive disorder), Osteopenia, Osteopenia, Other abnormal glucose, Other and unspecified hyperlipidemia, Pain in joint, shoulder region, Pneumonia (4098,1191), Sleep apnea, and Stress incontinence, female.  She is being evaluated today for concern of a recurrent UTI.  She does take Macrobid 100 mg at bedtime.  Over the last few days she has had more frequent urination, she reports that on 1 occasion she went to the bathroom about 20 times and 24-hour period.  He has been seen by uro-GYN for OAB, has tried and failed trospium, Myrbetriq, and Gemtesa.  During her last appointment a month ago SNM and bladder Botox was discussed.  When she was seen in our office about 3 weeks ago she was complaining of chronic fatigue, decreased stamina, and clammy skin.  She has been worked up in the past multiple times for fatigue without underlying cause.  It was felt that some of her issues may be due to polypharmacy decrease Lyrica from 150 mg to 75 mg daily  and discontinued Singulair.  Since decreasing the Lyrica she has not had any significant nerve pain and has not had no issues with allergies or cough.  She continues to be symptomatic and would like to reduce her Lyrica even further.  She is also going to talk to her psychiatrist about reducing Cymbalta.      Review of Systems See HPI   Past Medical History:   Diagnosis Date   Adenomatous colon polyp    Anxiety    PHOBIAS   Arthritis    Breast cancer (HCC) 08/08/2013   right LOQ   Cataract    Chronic insomnia    Cluster headaches    history of migraines / NONE FOR SEVERAL YRS   Depression    Diverticulosis    Fatty liver 2011   Fibromyalgia    GERD (gastroesophageal reflux disease)    H/O hiatal hernia    History of transfusion 08/30/2013   Hx of radiation therapy 10/29/13- 12/14/13   right chest wall 5040 cGy 28 sessions, right supraclavicular/axillary region 5040 cGy 28 sessions, right chest wall boost 1000 cGy 5 sessions   Hypothyroidism    Internal hemorrhoids    Irritable bowel syndrome    Kidney stone    Lymphedema    RT ARM - WEARS SLEEVE   Macular degeneration    hole/right eye   MDD (major depressive disorder)    Osteopenia    Osteopenia    Other abnormal glucose    Other and unspecified hyperlipidemia    Pain in joint, shoulder region    Pneumonia 4782,9562   Sleep apnea    USES C-PAP   Stress incontinence, female     Social History   Socioeconomic History   Marital status: Married    Spouse name: [name]   Number of children: 3   Years of education: Not on file   Highest education level: GED or equivalent  Occupational History   Occupation: retired bookkeeper  Tobacco Use   Smoking status: Former    Packs/day:  0.10    Years: 2.00    Additional pack years: 0.00    Total pack years: 0.20    Types: Cigarettes    Start date: 11/16/1959    Quit date: 11/15/1960    Years since quitting: 62.4    Passive exposure: Past (over 58 years ago)   Smokeless tobacco: Never  Vaping Use   Vaping Use: Never used  Substance and Sexual Activity   Alcohol use: Yes    Comment: rarely   Drug use: No   Sexual activity: Yes    Comment: menarche age 52, fist live birth 72, P 3, hysterectomy age 22, no HRT, BCP 2 yrs  Other Topics Concern   Not on file  Social History Narrative   Occupation:  Retired Catering manager     Married with 3 grown children      Never Smoked     Alcohol use-no        Social Determinants of Health   Financial Resource Strain: Low Risk  (02/09/2023)   Overall Financial Resource Strain (CARDIA)    Difficulty of Paying Living Expenses: Not hard at all  Food Insecurity: No Food Insecurity (02/09/2023)   Hunger Vital Sign    Worried About Running Out of Food in the Last Year: Never true    Ran Out of Food in the Last Year: Never true  Transportation Needs: No Transportation Needs (02/09/2023)   PRAPARE - Administrator, Civil Service (Medical): No    Lack of Transportation (Non-Medical): No  Physical Activity: Inactive (02/09/2023)   Exercise Vital Sign    Days of Exercise per Week: 0 days    Minutes of Exercise per Session: 0 min  Stress: No Stress Concern Present (02/09/2023)   [employer] of Occupational Health - Occupational Stress Questionnaire    Feeling of Stress : Not at all  Social Connections: Socially Integrated (02/09/2023)   Social Connection and Isolation Panel [NHANES]    Frequency of Communication with Friends and Family: More than three times a week    Frequency of Social Gatherings with Friends and Family: More than three times a week    Attends Religious Services: More than 4 times per year    Active Member of [employer] or Organizations: Yes    Attends Banker Meetings: More than 4 times per year    Marital Status: Married  Catering manager Violence: Not At Risk (02/09/2023)   Humiliation, Afraid, Rape, and Kick questionnaire    Fear of Current or Ex-Partner: No    Emotionally Abused: No    Physically Abused: No    Sexually Abused: No    Past Surgical History:  Procedure Laterality Date   ABDOMINAL HYSTERECTOMY     APPENDECTOMY     BILATERAL TOTAL MASTECTOMY WITH AXILLARY LYMPH NODE DISSECTION  08/30/2013   Dr Donell Beers   BREAST CYST ASPIRATION     9 cysts   CATARACT EXTRACTION, BILATERAL  2005/2007   CHOLECYSTECTOMY      COLONOSCOPY     CYSTOCELE REPAIR     EVACUATION BREAST HEMATOMA Left 08/31/2013   Procedure: EVACUATION HEMATOMA BREAST;  Surgeon: Almond Lint, MD;  Location: [facility];  Service: General;  Laterality: Left;   EYE SURGERY     to repair macular hole   FOOT ARTHROPLASTY     lt    GANGLION CYST EXCISION     rt foot   HEMORRHOID SURGERY     03/1993   JOINT REPLACEMENT  03/15/11  left knee replacement   KNEE ARTHROSCOPY     /partial knee 2016/left knee 2012   MASS EXCISION  11/04/2011   Procedure: EXCISION MASS;  Surgeon: Wyn Forster., MD;  Location: [facility];  Service: Orthopedics;  Laterality: Right;  excisional biopsy right ulna mass   MASTECTOMY W/ SENTINEL NODE BIOPSY Right 08/30/2013   Procedure: RIGHT  AXILLARY SENTINEL LYMPH NODE BIOPSY; Right Axillary Node Disection;  Surgeon: Almond Lint, MD;  Location: [facility];  Service: General;  Laterality: Right;  Right side nuc med 7:00    PARTIAL KNEE ARTHROPLASTY Right 11/03/2015   Procedure: RIGHT KNEE MEDIAL UNICOMPARTMENTAL ARTHROPLASTY ;  Surgeon: Ollen Gross, MD;  Location: [facility];  Service: Orthopedics;  Laterality: Right;   RECTOCELE REPAIR     SIMPLE MASTECTOMY WITH AXILLARY SENTINEL NODE BIOPSY Left 08/30/2013   Procedure: Bilateral Breast Mastectomy ;  Surgeon: Almond Lint, MD;  Location: [facility];  Service: General;  Laterality: Left;   skin tags removed     breast, panty line, neckline   TOE SURGERY     preventative crossover toe surg/right foot   TOE SURGERY  2009   left foot/screw  in 2nd toe   TONSILLECTOMY     UPPER GASTROINTESTINAL ENDOSCOPY      Family History  Problem Relation Age of Onset   Stroke Mother        died age 48   Diabetes Mother    Breast cancer Mother 33   Breast cancer Sister 71   Brain cancer Maternal Uncle 8   Breast cancer Paternal Aunt 17   Breast cancer Paternal Aunt        dx in her 24s   Cancer Maternal Grandmother        intra-abdominal cancer   Diabetes Maternal  Grandfather    Breast cancer Paternal Grandmother 51   Brain cancer Cousin 17       maternal cousin   Brain cancer Cousin 20       paternal cousin   Colon cancer Neg Hx    Colon polyps Neg Hx    Crohn's disease Neg Hx    Esophageal cancer Neg Hx    Rectal cancer Neg Hx    Stomach cancer Neg Hx    Ulcerative colitis Neg Hx     No Known Allergies  Current Outpatient Medications on File Prior to Visit  Medication Sig Dispense Refill   metFORMIN (GLUCOPHAGE) 500 MG tablet Take 1 tablet (500 mg total) by mouth daily with breakfast. 30 tablet 0   Bacillus Coagulans-Inulin (PROBIOTIC) 1-250 BILLION-MG CAPS Take by mouth. (Patient not taking: Reported on 03/22/2023)     butalbital-acetaminophen-caffeine (FIORICET) 50-325-40 MG tablet Take 1 tablet by mouth as needed for headache. 30 tablet 2   Calcium Carbonate-Vitamin D (CALCIUM-VITAMIN D3 PO) Take 1 tablet by mouth in the morning and at bedtime.     diazepam (VALIUM) 5 MG tablet Take 5 mg by mouth every 6 (six) hours as needed for anxiety. As needed (flying & dental)     DULoxetine (CYMBALTA) 60 MG capsule Take 1 capsule (60 mg total) by mouth daily. 90 capsule 1   esomeprazole (NEXIUM) 40 MG capsule Take 1 capsule (40 mg total) by mouth daily at 12 noon. (Patient taking differently: Take 40 mg by mouth every morning.) 365 capsule 0   famotidine (PEPCID) 40 MG tablet Take 1 tablet (40 mg total) by mouth at bedtime. 90 tablet 0   levothyroxine (SYNTHROID) 100  MCG tablet TAKE ONE TABLET BY MOUTH DAILY 365 tablet 0   montelukast (SINGULAIR) 10 MG tablet TAKE 1 TABLET BY MOUTH AT BEDTIME (Patient not taking: Reported on 04/12/2023) 90 tablet 1   nitrofurantoin, macrocrystal-monohydrate, (MACROBID) 100 MG capsule Take 1 capsule (100 mg total) by mouth at bedtime. 365 capsule 0   omega-3 acid ethyl esters (LOVAZA) 1 g capsule TAKE ONE CAPSULE BY MOUTH TWICE A DAY 180 capsule 3   Polyethylene Glycol 3350 (MIRALAX PO) Take by mouth. 1/2 capful every  morning     pregabalin (LYRICA) 75 MG capsule Take 1 capsule (75 mg total) by mouth daily. 30 capsule 0   rosuvastatin (CRESTOR) 20 MG tablet Take 1 tablet (20 mg total) by mouth daily. (Patient taking differently: Take 20 mg by mouth at bedtime.) 365 tablet 0   Vibegron 75 MG TABS Take 1 tablet (75 mg total) by mouth daily. 30 tablet 5   No current facility-administered medications on file prior to visit.    BP 120/80   Pulse 81   Temp 97.7 F (36.5 C) (Oral)   Ht 4\' 11"  (1.499 m)   Wt 179 lb (81.2 kg)   SpO2 95%   BMI 36.15 kg/m       Objective:   Physical Exam Vitals and nursing note reviewed.  Constitutional:      Appearance: Normal appearance. She is obese.  Cardiovascular:     Rate and Rhythm: Normal rate and regular rhythm.     Pulses: Normal pulses.     Heart sounds: Normal heart sounds.  Musculoskeletal:        General: Normal range of motion.  Skin:    General: Skin is warm and dry.  Neurological:     General: No focal deficit present.     Mental Status: She is alert and oriented to person, place, and time.  Psychiatric:        Mood and Affect: Mood normal.        Behavior: Behavior normal.       Assessment & Plan:  1. Frequent urination  - POC Urinalysis Dipstick-negative for UTI.  She was advised to follow-up with GYN and to consider starting with bladder Botox for her symptoms.  2. Incisional pain -Decrease her dose of Lyrica to 50 mg daily.   Follow-up in 30 days to let me know how she is doing - pregabalin (LYRICA) 50 MG capsule; Take 1 capsule (50 mg total) by mouth 3 (three) times daily. (Patient taking differently: Take 50 mg by mouth 2 (two) times daily.)  Dispense: 30 capsule; Refill: 0  3. Other fatigue -Encouraged to discuss reducing dose of Cymbalta with her psychiatrist.  I would like her getting out of the house more and doing more exercise on a weekly basis. - pregabalin (LYRICA) 50 MG capsule; Take 1 capsule (50 mg total) by mouth 3  (three) times daily. (Patient taking differently: Take 50 mg by mouth 2 (two) times daily.)  Dispense: 30 capsule; Refill: 0  Shirline Frees, NP  Time spent with patient today was 32 minutes which consisted of chart review, discussing fatigue and frequent urination, work up, treatment answering questions and documentation.

## 2023-04-28 ENCOUNTER — Other Ambulatory Visit: Payer: Self-pay | Admitting: Adult Health

## 2023-04-28 ENCOUNTER — Other Ambulatory Visit: Payer: Self-pay | Admitting: Physician Assistant

## 2023-04-28 MED ORDER — PREGABALIN 50 MG PO CAPS: 50.0000 mg | ORAL_CAPSULE | Freq: Every day | ORAL | 1 refills | Status: DC

## 2023-04-28 NOTE — Telephone Encounter (Signed)
Noted  

## 2023-04-28 NOTE — Telephone Encounter (Signed)
Patient notified of update  and verbalized understanding. 

## 2023-05-03 ENCOUNTER — Ambulatory Visit: Payer: Medicare HMO | Admitting: Adult Health

## 2023-05-10 ENCOUNTER — Other Ambulatory Visit: Payer: Self-pay | Admitting: Adult Health

## 2023-05-10 MED ORDER — TRAMADOL HCL 50 MG PO TABS: 50.0000 mg | ORAL_TABLET | Freq: Three times a day (TID) | ORAL | 0 refills | Status: AC | PRN

## 2023-05-11 ENCOUNTER — Ambulatory Visit (INDEPENDENT_AMBULATORY_CARE_PROVIDER_SITE_OTHER): Payer: Medicare HMO | Admitting: Adult Health

## 2023-05-11 ENCOUNTER — Encounter: Payer: Self-pay | Admitting: Adult Health

## 2023-05-11 VITALS — BP 130/88 | HR 91 | Temp 98.1°F | Ht 59.0 in | Wt 175.0 lb

## 2023-05-11 DIAGNOSIS — R5383 Other fatigue: Secondary | ICD-10-CM | POA: Diagnosis not present

## 2023-05-11 DIAGNOSIS — L7682 Other postprocedural complications of skin and subcutaneous tissue: Secondary | ICD-10-CM

## 2023-05-11 DIAGNOSIS — R232 Flushing: Secondary | ICD-10-CM

## 2023-05-11 LAB — BASIC METABOLIC PANEL
BUN: 24 mg/dL — ABNORMAL HIGH (ref 6–23)
CO2: 28 mEq/L (ref 19–32)
Calcium: 10 mg/dL (ref 8.4–10.5)
Chloride: 103 mEq/L (ref 96–112)
Creatinine, Ser: 0.88 mg/dL (ref 0.40–1.20)
GFR: 62.22 mL/min (ref >60.00)
Glucose, Bld: 124 mg/dL — ABNORMAL HIGH (ref 70–99)
Potassium: 4 mEq/L (ref 3.5–5.1)
Sodium: 139 mEq/L (ref 135–145)

## 2023-05-11 LAB — TESTOSTERONE: Testosterone: 69.24 ng/dL — ABNORMAL HIGH (ref 15.00–40.00)

## 2023-05-11 MED ORDER — PREGABALIN 50 MG PO CAPS: 50.0000 mg | ORAL_CAPSULE | Freq: Every day | ORAL | 0 refills | Status: DC

## 2023-05-11 NOTE — Progress Notes (Signed)
Subjective:    [name], female    DOB: 04-08-1941, 82 y.o.   MRN: [id number]  HPI  82 year old female who  has a past medical history of Adenomatous colon polyp, Anxiety, Arthritis, Breast cancer (HCC) (08/08/2013), Cataract, Chronic insomnia, Cluster headaches, Depression, Diverticulosis, Fatty liver (2011), Fibromyalgia, GERD (gastroesophageal reflux disease), H/O hiatal hernia, History of transfusion (08/30/2013), radiation therapy (10/29/13- 12/14/13), Hypothyroidism, Internal hemorrhoids, Irritable bowel syndrome, Kidney stone, Lymphedema, Macular degeneration, MDD (major depressive disorder), Osteopenia, Osteopenia, Other abnormal glucose, Other and unspecified hyperlipidemia, Pain in joint, shoulder region, Pneumonia (8413,2440), Sleep apnea, and Stress incontinence, female.  She is being evaluated today for follow-up for chronic fatigue.  She has been worked up in the past multiple times for fatigue without underlying cause.  It was felt that to some degree her issues may be due to polypharmacy and we have been decreasing her Lyrica dose to the lowest possible dose that she can tolerate.  She is currently at 50 mg daily  Her husband reports that she continues to have no energy and is fatigued all the time. She will wake up, have breakfast and go back to bed. She is sleeping about 16 hours a day.   She has started cutting back on her cymbalta to every other day she has not noticed any changes in regards to fatigue.   She continues to have hot flashes and feel diaphoretic.    Review of Systems See HPI   Past Medical History:  Diagnosis Date   Adenomatous colon polyp    Anxiety    PHOBIAS   Arthritis    Breast cancer (HCC) 08/08/2013   right LOQ   Cataract    Chronic insomnia    Cluster headaches    history of migraines / NONE FOR SEVERAL YRS   Depression    Diverticulosis    Fatty liver 2011   Fibromyalgia    GERD (gastroesophageal reflux disease)    H/O  hiatal hernia    History of transfusion 08/30/2013   Hx of radiation therapy 10/29/13- 12/14/13   right chest wall 5040 cGy 28 sessions, right supraclavicular/axillary region 5040 cGy 28 sessions, right chest wall boost 1000 cGy 5 sessions   Hypothyroidism    Internal hemorrhoids    Irritable bowel syndrome    Kidney stone    Lymphedema    RT ARM - WEARS SLEEVE   Macular degeneration    hole/right eye   MDD (major depressive disorder)    Osteopenia    Osteopenia    Other abnormal glucose    Other and unspecified hyperlipidemia    Pain in joint, shoulder region    Pneumonia 1027,2536   Sleep apnea    USES C-PAP   Stress incontinence, female     Social History   Socioeconomic History   Marital status: Married    Spouse name: [name]   Number of children: 3   Years of education: Not on file   Highest education level: GED or equivalent  Occupational History   Occupation: retired Catering manager  Tobacco Use   Smoking status: Former    Packs/day: 0.10    Years: 2.00    Additional pack years: 0.00    Total pack years: 0.20    Types: Cigarettes    Start date: 11/16/1959    Quit date: 11/15/1960    Years since quitting: 62.5    Passive exposure: Past (over 58 years ago)   Smokeless tobacco: Never  Vaping  Use   Vaping Use: Never used  Substance and Sexual Activity   Alcohol use: Yes    Comment: rarely   Drug use: No   Sexual activity: Yes    Comment: menarche age 60, fist live birth 58, P 3, hysterectomy age 78, no HRT, BCP 2 yrs  Other Topics Concern   Not on file  Social History Narrative   Occupation:  Retired Catering manager    Married with 3 grown children      Never Smoked     Alcohol use-no        Social Determinants of Health   Financial Resource Strain: Low Risk  (02/09/2023)   Overall Financial Resource Strain (CARDIA)    Difficulty of Paying Living Expenses: Not hard at all  Food Insecurity: No Food Insecurity (02/09/2023)   Hunger Vital Sign    Worried About  Running Out of Food in the Last Year: Never true    Ran Out of Food in the Last Year: Never true  Transportation Needs: No Transportation Needs (02/09/2023)   PRAPARE - Administrator, Civil Service (Medical): No    Lack of Transportation (Non-Medical): No  Physical Activity: Inactive (02/09/2023)   Exercise Vital Sign    Days of Exercise per Week: 0 days    Minutes of Exercise per Session: 0 min  Stress: No Stress Concern Present (02/09/2023)   [employer] of Occupational Health - Occupational Stress Questionnaire    Feeling of Stress : Not at all  Social Connections: Socially Integrated (02/09/2023)   Social Connection and Isolation Panel [NHANES]    Frequency of Communication with Friends and Family: More than three times a week    Frequency of Social Gatherings with Friends and Family: More than three times a week    Attends Religious Services: More than 4 times per year    Active Member of Clubs or Organizations: Yes    Attends Banker Meetings: More than 4 times per year    Marital Status: Married  Catering manager Violence: Not At Risk (02/09/2023)   Humiliation, Afraid, Rape, and Kick questionnaire    Fear of Current or Ex-Partner: No    Emotionally Abused: No    Physically Abused: No    Sexually Abused: No    Past Surgical History:  Procedure Laterality Date   ABDOMINAL HYSTERECTOMY     APPENDECTOMY     BILATERAL TOTAL MASTECTOMY WITH AXILLARY LYMPH NODE DISSECTION  08/30/2013   Dr Donell Beers   BREAST CYST ASPIRATION     9 cysts   CATARACT EXTRACTION, BILATERAL  2005/2007   CHOLECYSTECTOMY     COLONOSCOPY     CYSTOCELE REPAIR     EVACUATION BREAST HEMATOMA Left 08/31/2013   Procedure: EVACUATION HEMATOMA BREAST;  Surgeon: Almond Lint, MD;  Location: [facility];  Service: General;  Laterality: Left;   EYE SURGERY     to repair macular hole   FOOT ARTHROPLASTY     lt    GANGLION CYST EXCISION     rt foot   HEMORRHOID SURGERY     03/1993    JOINT REPLACEMENT  03/15/11   left knee replacement   KNEE ARTHROSCOPY     /partial knee 2016/left knee 2012   MASS EXCISION  11/04/2011   Procedure: EXCISION MASS;  Surgeon: Wyn Forster., MD;  Location: [facility];  Service: Orthopedics;  Laterality: Right;  excisional biopsy right ulna mass   MASTECTOMY W/ SENTINEL NODE BIOPSY  Right 08/30/2013   Procedure: RIGHT  AXILLARY SENTINEL LYMPH NODE BIOPSY; Right Axillary Node Disection;  Surgeon: Almond Lint, MD;  Location: [facility];  Service: General;  Laterality: Right;  Right side nuc med 7:00    PARTIAL KNEE ARTHROPLASTY Right 11/03/2015   Procedure: RIGHT KNEE MEDIAL UNICOMPARTMENTAL ARTHROPLASTY ;  Surgeon: Ollen Gross, MD;  Location: [facility];  Service: Orthopedics;  Laterality: Right;   RECTOCELE REPAIR     SIMPLE MASTECTOMY WITH AXILLARY SENTINEL NODE BIOPSY Left 08/30/2013   Procedure: Bilateral Breast Mastectomy ;  Surgeon: Almond Lint, MD;  Location: [facility];  Service: General;  Laterality: Left;   skin tags removed     breast, panty line, neckline   TOE SURGERY     preventative crossover toe surg/right foot   TOE SURGERY  2009   left foot/screw  in 2nd toe   TONSILLECTOMY     UPPER GASTROINTESTINAL ENDOSCOPY      Family History  Problem Relation Age of Onset   Stroke Mother        died age 38   Diabetes Mother    Breast cancer Mother 26   Breast cancer Sister 62   Brain cancer Maternal Uncle 8   Breast cancer Paternal Aunt 97   Breast cancer Paternal Aunt        dx in her 37s   Cancer Maternal Grandmother        intra-abdominal cancer   Diabetes Maternal Grandfather    Breast cancer Paternal Grandmother 1   Brain cancer Cousin 47       maternal cousin   Brain cancer Cousin 20       paternal cousin   Colon cancer Neg Hx    Colon polyps Neg Hx    Crohn's disease Neg Hx    Esophageal cancer Neg Hx    Rectal cancer Neg Hx    Stomach cancer Neg Hx    Ulcerative colitis Neg Hx     No Known  Allergies  Current Outpatient Medications on File Prior to Visit  Medication Sig Dispense Refill   Bacillus Coagulans-Inulin (PROBIOTIC) 1-250 BILLION-MG CAPS Take by mouth.     butalbital-acetaminophen-caffeine (FIORICET) 50-325-40 MG tablet Take 1 tablet by mouth as needed for headache. 30 tablet 2   Calcium Carbonate-Vitamin D (CALCIUM-VITAMIN D3 PO) Take 1 tablet by mouth in the morning and at bedtime.     diazepam (VALIUM) 5 MG tablet Take 5 mg by mouth every 6 (six) hours as needed for anxiety. As needed (flying & dental)     DULoxetine (CYMBALTA) 60 MG capsule Take 1 capsule (60 mg total) by mouth daily. 90 capsule 1   esomeprazole (NEXIUM) 40 MG capsule Take 1 capsule (40 mg total) by mouth daily at 12 noon. (Patient taking differently: Take 40 mg by mouth every morning.) 365 capsule 0   famotidine (PEPCID) 40 MG tablet TAKE 1 TABLET BY MOUTH AT BEDTIME 90 tablet 0   levothyroxine (SYNTHROID) 100 MCG tablet TAKE ONE TABLET BY MOUTH DAILY 365 tablet 0   metFORMIN (GLUCOPHAGE) 500 MG tablet Take 1 tablet (500 mg total) by mouth daily with breakfast. 30 tablet 0   nitrofurantoin, macrocrystal-monohydrate, (MACROBID) 100 MG capsule Take 1 capsule (100 mg total) by mouth at bedtime. 365 capsule 0   omega-3 acid ethyl esters (LOVAZA) 1 g capsule TAKE ONE CAPSULE BY MOUTH TWICE A DAY 180 capsule 3   Polyethylene Glycol 3350 (MIRALAX PO) Take by mouth. 1/2 capful every morning  rosuvastatin (CRESTOR) 20 MG tablet Take 1 tablet (20 mg total) by mouth daily. (Patient taking differently: Take 20 mg by mouth at bedtime.) 365 tablet 0   traMADol (ULTRAM) 50 MG tablet Take 1 tablet (50 mg total) by mouth every 8 (eight) hours as needed for up to 7 days. 21 tablet 0   Vibegron 75 MG TABS Take 1 tablet (75 mg total) by mouth daily. 30 tablet 5   No current facility-administered medications on file prior to visit.    BP 130/88   Pulse 91   Temp 98.1 F (36.7 C) (Oral)   Ht 4\' 11"  (1.499 m)   Wt  175 lb (79.4 kg)   SpO2 94%   BMI 35.35 kg/m       Objective:   Physical Exam Vitals and nursing note reviewed.  Constitutional:      Appearance: Normal appearance. She is diaphoretic.  Cardiovascular:     Rate and Rhythm: Normal rate and regular rhythm.     Pulses: Normal pulses.     Heart sounds: Normal heart sounds.  Pulmonary:     Effort: Pulmonary effort is normal.     Breath sounds: Normal breath sounds.  Skin:    General: Skin is warm.  Neurological:     General: No focal deficit present.     Mental Status: She is oriented to person, place, and time.  Psychiatric:        Mood and Affect: Mood normal.        Behavior: Behavior normal.        Thought Content: Thought content normal.       Assessment & Plan:  1. Other fatigue - Multiple workups without cause. Possibly related to Cymbalta. Asked they hey call her psychiatrist and see if they can get in sooner than two months away  - Possible hormonal deficiency? Will check estrogen and testosterone today. Will need help with urogyn for other testing if they feel appropriate. She has an appointment in about a month with them  - Estrogens, Total; Future - Testosterone; Future - Basic Metabolic Panel; Future - Basic Metabolic Panel - Testosterone - Estrogens, Total  2. Hot flashes  - Estrogens, Total; Future - Testosterone; Future - Basic Metabolic Panel; Future - Basic Metabolic Panel - Testosterone - Estrogens, Total  3. Incisional pain - Will keep her on Lyrica 50 mg daily as she seems to be doing well with this low dose  - pregabalin (LYRICA) 50 MG capsule; Take 1 capsule (50 mg total) by mouth daily.  Dispense: 90 capsule; Refill: 0  Shirline Frees, NP

## 2023-05-17 ENCOUNTER — Other Ambulatory Visit: Payer: Self-pay | Admitting: Adult Health

## 2023-05-17 LAB — ESTROGENS, TOTAL: Estrogen: 112 pg/mL

## 2023-05-21 ENCOUNTER — Other Ambulatory Visit: Payer: Self-pay | Admitting: Adult Health

## 2023-05-24 ENCOUNTER — Encounter: Payer: Self-pay | Admitting: Adult Health

## 2023-05-25 ENCOUNTER — Encounter: Payer: Self-pay | Admitting: Adult Health

## 2023-05-25 NOTE — Telephone Encounter (Signed)
This was taking care of. See result notes.

## 2023-05-27 ENCOUNTER — Ambulatory Visit: Payer: Medicare HMO | Admitting: Family Medicine

## 2023-05-28 ENCOUNTER — Encounter: Payer: Self-pay | Admitting: Adult Health

## 2023-05-31 NOTE — Telephone Encounter (Signed)
Please advise 

## 2023-06-01 ENCOUNTER — Ambulatory Visit: Payer: Medicare HMO | Admitting: Family Medicine

## 2023-06-03 ENCOUNTER — Ambulatory Visit: Payer: Medicare HMO | Admitting: Obstetrics and Gynecology

## 2023-06-05 ENCOUNTER — Encounter (HOSPITAL_BASED_OUTPATIENT_CLINIC_OR_DEPARTMENT_OTHER): Payer: Self-pay

## 2023-06-05 ENCOUNTER — Emergency Department (HOSPITAL_BASED_OUTPATIENT_CLINIC_OR_DEPARTMENT_OTHER): Payer: Medicare HMO

## 2023-06-05 ENCOUNTER — Emergency Department (HOSPITAL_BASED_OUTPATIENT_CLINIC_OR_DEPARTMENT_OTHER)
Admission: EM | Admit: 2023-06-05 | Discharge: 2023-06-05 | Disposition: A | Discharge status: Home / Self Care | Payer: Medicare HMO | Attending: Emergency Medicine | Admitting: Emergency Medicine

## 2023-06-05 DIAGNOSIS — R739 Hyperglycemia, unspecified: Secondary | ICD-10-CM | POA: Diagnosis not present

## 2023-06-05 DIAGNOSIS — R1084 Generalized abdominal pain: Secondary | ICD-10-CM | POA: Insufficient documentation

## 2023-06-05 DIAGNOSIS — R109 Unspecified abdominal pain: Secondary | ICD-10-CM | POA: Diagnosis present

## 2023-06-05 LAB — URINALYSIS, ROUTINE W REFLEX MICROSCOPIC
Bacteria, UA: NONE SEEN
Bilirubin Urine: NEGATIVE
Glucose, UA: NEGATIVE mg/dL
Hgb urine dipstick: NEGATIVE
Leukocytes,Ua: NEGATIVE
Nitrite: NEGATIVE
Protein, ur: 30 mg/dL — AB
Specific Gravity, Urine: 1.038 — ABNORMAL HIGH (ref 1.005–1.030)
pH: 6 (ref 5.0–8.0)

## 2023-06-05 LAB — COMPREHENSIVE METABOLIC PANEL
ALT: 21 U/L (ref 0–44)
AST: 14 U/L — ABNORMAL LOW (ref 15–41)
Albumin: 4.4 g/dL (ref 3.5–5.0)
Alkaline Phosphatase: 60 U/L (ref 38–126)
Anion gap: 9 (ref 5–15)
BUN: 16 mg/dL (ref 8–23)
CO2: 26 mmol/L (ref 22–32)
Calcium: 9.9 mg/dL (ref 8.9–10.3)
Chloride: 103 mmol/L (ref 98–111)
Creatinine, Ser: 0.76 mg/dL (ref 0.44–1.00)
GFR, Estimated: >60 mL/min (ref >60)
Glucose, Bld: 260 mg/dL — ABNORMAL HIGH (ref 70–99)
Potassium: 3.7 mmol/L (ref 3.5–5.1)
Sodium: 138 mmol/L (ref 135–145)
Total Bilirubin: 0.9 mg/dL (ref 0.3–1.2)
Total Protein: 7 g/dL (ref 6.5–8.1)

## 2023-06-05 LAB — CBC
HCT: 47 % — ABNORMAL HIGH (ref 36.0–46.0)
Hemoglobin: 15.9 g/dL — ABNORMAL HIGH (ref 12.0–15.0)
MCH: 29.8 pg (ref 26.0–34.0)
MCHC: 33.8 g/dL (ref 30.0–36.0)
MCV: 88 fL (ref 80.0–100.0)
Platelets: 234 10*3/uL (ref 150–400)
RBC: 5.34 MIL/uL — ABNORMAL HIGH (ref 3.87–5.11)
RDW: 13.7 % (ref 11.5–15.5)
WBC: 7.9 10*3/uL (ref 4.0–10.5)
nRBC: 0 % (ref 0.0–0.2)

## 2023-06-05 LAB — LIPASE, BLOOD: Lipase: 11 U/L (ref 11–51)

## 2023-06-05 LAB — CBG MONITORING, ED: Glucose-Capillary: 131 mg/dL — ABNORMAL HIGH (ref 70–99)

## 2023-06-05 MED ORDER — INSULIN ASPART 100 UNIT/ML IJ SOLN
5.0000 [IU] | Freq: Once | INTRAMUSCULAR | Status: AC
  Administered 2023-06-05: 5 [IU] via SUBCUTANEOUS

## 2023-06-05 MED ORDER — IOHEXOL 300 MG/ML  SOLN
100.0000 mL | Freq: Once | INTRAMUSCULAR | Status: AC | PRN
  Administered 2023-06-05: 100 mL via INTRAVENOUS

## 2023-06-05 MED ORDER — ACETAMINOPHEN 325 MG PO TABS
650.0000 mg | ORAL_TABLET | Freq: Once | ORAL | Status: AC
  Administered 2023-06-05: 650 mg via ORAL
  Filled 2023-06-05: qty 2

## 2023-06-05 NOTE — ED Triage Notes (Signed)
Patient here POV from Home.  Endorses ABD Cramping (Lower) intermittently for a few weeks. No N/V/D. No Known Fevers. Visited an ED last week and was sidcahrged but seeks re-evaluation for continued discomfort.   NAD Noted during triage. A&Ox4. GCS 15. Ambulatory.

## 2023-06-05 NOTE — ED Notes (Signed)
Transport to ct

## 2023-06-05 NOTE — Discharge Instructions (Signed)
You have been evaluated for your symptoms.  Your blood sugar is elevated today at 260.  It did improve with insulin.  Please call and follow-up closely with your primary care provider for further managements of your condition.  You may likely benefit from a hemoglobin A1c and possibly started on medication to treat for suspected diabetes.  Your CT scan today shows evidence of sigmoid diverticulosis without evidence of diverticulitis.

## 2023-06-05 NOTE — ED Notes (Signed)
Dc instructions reviewed with patient. Patient voiced understanding. Dc with belongings.  °

## 2023-06-05 NOTE — ED Provider Notes (Signed)
[place] EMERGENC[facility] DEPARTMENT AT [facility] Provider Note   CSN: [id number] Arrival date & time: 06/05/23  1154     History  Chief Complaint  Patient presents with   Abdominal Cramping    Jean Nash is a 82 y.o. female.  The history is provided by the patient, the spouse and medical records. No language interpreter was used.  Abdominal Cramping     82 year old female significant history of IBS, GERD, stress incontinence, kidney stones, internal hemorrhoid, fibromyalgia, diverticulosis presenting to the ED from home with complaints of abdominal pain.  History obtained through patient and through husband who is at bedside.  Patient has had recurrent abdominal discomfort ongoing for several years.  States that she has been evaluated fairly thoroughly for her condition without any definitive diagnosis.  Husband mention she had a full body CT scan done several months ago that was unremarkable.  She also had colonoscopy that has several polyps and she is scheduled to have one of the polyps removed in the near future.  For the past 2 weeks she has endorsed increased frequency of abdominal discomfort.  She is unable to describe exactly how it feels but it is waxing waning and early this morning it was intense.  Pain is not associate with fever or chills no nausea vomiting or diarrhea no dysuria no hematuria no constipation.  Does endorse occasional bouts of loose stools.  Patient does have a urgent urinary incontinence which is not new and this is ongoing for several years.  At this time she mention her pain is minimal.  She takes Fioricet on occasion.  Home Medications Prior to Admission medications   Medication Sig Start Date End Date Taking? Authorizing Provider  Bacillus Coagulans-Inulin (PROBIOTIC) 1-250 BILLION-MG CAPS Take by mouth.    [provider]  butalbital-acetaminophen-caffeine (FIORICET) [phone] MG tablet Take 1 tablet by mouth as needed for headache.  01/27/22   Shirline Frees, NP  Calcium Carbonate-Vitamin D (CALCIUM-VITAMIN D3 PO) Take 1 tablet by mouth in the morning and at bedtime.    [provider]  diazepam (VALIUM) 5 MG tablet Take 5 mg by mouth every 6 (six) hours as needed for anxiety. As needed (flying & dental)    [provider]  DULoxetine (CYMBALTA) 60 MG capsule Take 1 capsule (60 mg total) by mouth daily. 01/10/23   Stasia Cavalier, MD  esomeprazole (NEXIUM) 40 MG capsule Take 1 capsule (40 mg total) by mouth daily at 12 noon. Patient taking differently: Take 40 mg by mouth every morning. 02/22/23   Nafziger, Kandee Keen, NP  famotidine (PEPCID) 40 MG tablet TAKE 1 TABLET BY MOUTH AT BEDTIME 04/29/23   Unk Lightning, PA  levothyroxine (SYNTHROID) 100 MCG tablet TAKE 1 TABLET BY MOUTH DAILY 05/24/23   Nafziger, Kandee Keen, NP  nitrofurantoin, macrocrystal-monohydrate, (MACROBID) 100 MG capsule Take 1 capsule (100 mg total) by mouth at bedtime. 09/21/22   Nafziger, Kandee Keen, NP  omega-3 acid ethyl esters (LOVAZA) 1 g capsule TAKE ONE CAPSULE BY MOUTH TWICE A DAY 08/26/22   Nafziger, Kandee Keen, NP  Polyethylene Glycol 3350 (MIRALAX PO) Take by mouth. 1/2 capful every morning    [provider]  pregabalin (LYRICA) 50 MG capsule Take 1 capsule (50 mg total) by mouth daily. 05/11/23   Nafziger, Kandee Keen, NP  rosuvastatin (CRESTOR) 20 MG tablet Take 1 tablet (20 mg total) by mouth daily. Patient taking differently: Take 20 mg by mouth at bedtime. 09/02/22   Shirline Frees, NP  Vibegron 75  MG TABS Take 1 tablet (75 mg total) by mouth daily. 03/17/23   Selmer Dominion, NP      Allergies    Metformin and related    Review of Systems   Review of Systems  All other systems reviewed and are negative.   Physical Exam Updated Vital Signs BP (!) 149/77 (BP Location: Left Arm)   Pulse 93   Temp 98 F (36.7 C) (Oral)   Resp 18   Ht 4\' 11"  (1.499 m)   Wt 78 kg   SpO2 94%   BMI 34.74 kg/m  Physical Exam Vitals and nursing note  reviewed.  Constitutional:      General: She is not in acute distress.    Appearance: She is well-developed.  HENT:     Head: Atraumatic.  Eyes:     Conjunctiva/sclera: Conjunctivae normal.  Cardiovascular:     Rate and Rhythm: Normal rate and regular rhythm.     Pulses: Normal pulses.     Heart sounds: Normal heart sounds.  Pulmonary:     Effort: Pulmonary effort is normal.     Breath sounds: Normal breath sounds.  Abdominal:     Palpations: Abdomen is soft.     Tenderness: There is abdominal tenderness (Mild suprapubic tenderness with some firmness appreciated on gentle palpation but no overlying skin changes.).  Musculoskeletal:     Cervical back: Neck supple.  Skin:    Findings: No rash.  Neurological:     Mental Status: She is alert.  Psychiatric:        Mood and Affect: Mood normal.     ED Results / Procedures / Treatments   Labs (all labs ordered are listed, but only abnormal results are displayed) Labs Reviewed  COMPREHENSIVE METABOLIC PANEL - Abnormal; Notable for the following components:      Result Value   Glucose, Bld 260 (*)    AST 14 (*)    All other components within normal limits  CBC - Abnormal; Notable for the following components:   RBC 5.34 (*)    Hemoglobin 15.9 (*)    HCT 47.0 (*)    All other components within normal limits  URINALYSIS, ROUTINE W REFLEX MICROSCOPIC - Abnormal; Notable for the following components:   APPearance HAZY (*)    Specific Gravity, Urine 1.038 (*)    Ketones, ur TRACE (*)    Protein, ur 30 (*)    All other components within normal limits  CBG MONITORING, ED - Abnormal; Notable for the following components:   Glucose-Capillary 131 (*)    All other components within normal limits  LIPASE, BLOOD    EKG None  Date: 06/05/2023  Rate: 82  Rhythm: normal sinus rhythm  QRS Axis: normal  Intervals: normal  ST/T Wave abnormalities: normal  Conduction Disutrbances: none  Narrative Interpretation:   Old EKG  Reviewed: No significant changes noted    Radiology CT ABDOMEN PELVIS W CONTRAST  Result Date: 06/05/2023 CLINICAL DATA:  Acute abdominal pain.  Abdominal cramping. EXAM: CT ABDOMEN AND PELVIS WITH CONTRAST TECHNIQUE: Multidetector CT imaging of the abdomen and pelvis was performed using the standard protocol following bolus administration of intravenous contrast. RADIATION DOSE REDUCTION: This exam was performed according to the departmental dose-optimization program which includes automated exposure control, adjustment of the mA and/or kV according to patient size and/or use of iterative reconstruction technique. CONTRAST:  OMNIPAQUE IOHEXOL 300 MG/ML  SOLN COMPARISON:  CT examination dated January 20, 2023 FINDINGS: Lower chest:  No acute abnormality. Hepatobiliary: No focal liver abnormality is seen. Status post cholecystectomy. No biliary dilatation. Pancreas: Unremarkable. No pancreatic ductal dilatation or surrounding inflammatory changes. Spleen: Normal in size without focal abnormality. Adrenals/Urinary Tract: Adrenal glands are unremarkable. Kidneys are normal, without renal calculi, focal lesion, or hydronephrosis. Simple cysts in the lower pole of the left kidney measuring up to 2.5 cm, likely Bosniak type 1 cysts. Bladder is unremarkable. Stomach/Bowel: Stomach is within normal limits. Appendix is not identified. There is marked sigmoid colonic diverticulosis without evidence of acute diverticulitis. Vascular/Lymphatic: Aortic atherosclerosis. No enlarged abdominal or pelvic lymph nodes. Reproductive: Status post hysterectomy. No adnexal masses. Other: No abdominal wall hernia or abnormality. No abdominopelvic ascites. 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 Grade 1 anterolisthesis of L3. IMPRESSION: 1. Marked sigmoid colonic diverticulosis without evidence of acute diverticulitis. 2. No evidence of nephrolithiasis or hydronephrosis. 3. Aortic  atherosclerosis. 4. 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 Grade 1 anterolisthesis of L3. Aortic Atherosclerosis (ICD10-I70.0). Electronically Signed   By: Larose Hires D.O.   On: 06/05/2023 15:26    Procedures Procedures    Medications Ordered in ED Medications  iohexol (OMNIPAQUE) 300 MG/ML solution 100 mL (100 mLs Intravenous Contrast Given 06/05/23 1418)  insulin aspart (novoLOG) injection 5 Units (5 Units Subcutaneous Given 06/05/23 1650)  acetaminophen (TYLENOL) tablet 650 mg (650 mg Oral Given 06/05/23 1736)    ED Course/ Medical Decision Making/ A&P                             Medical Decision Making Amount and/or Complexity of Data Reviewed Labs: ordered. Radiology: ordered.  Risk OTC drugs. Prescription drug management.   BP (!) 149/77 (BP Location: Left Arm)   Pulse 93   Temp 98 F (36.7 C) (Oral)   Resp 18   Ht 4\' 11"  (1.499 m)   Wt 78 kg   SpO2 94%   BMI 34.74 kg/m   30:22 PM  82 year old female significant history of IBS, GERD, stress incontinence, kidney stones, internal hemorrhoid, fibromyalgia, diverticulosis presenting to the ED from home with complaints of abdominal pain.  History obtained through patient and through husband who is at bedside.  Patient has had recurrent abdominal discomfort ongoing for several years.  States that she has been evaluated fairly thoroughly for her condition without any definitive diagnosis.  Husband mention she had a full body CT scan done several months ago that was unremarkable.  She also had colonoscopy that has several polyps and she is scheduled to have one of the polyps removed in the near future.  For the past 2 weeks she has endorsed increased frequency of abdominal discomfort.  She is unable to describe exactly how it feels but it is waxing waning and early this morning it was intense.  Pain is not associate with fever or chills no nausea vomiting or diarrhea no dysuria no hematuria no  constipation.  Does endorse occasional bouts of loose stools.  Patient does have a urgent urinary incontinence which is not new and this is ongoing for several years.  At this time she mention her pain is minimal.  She takes Fioricet on occasion.  On exam patient is laying bed appears to be in no acute discomfort.  Heart with normal rate and rhythm, lungs clear to auscultation bilaterally abdomen is soft however I was able to palpate some firmness to her suprapubic region but there is  no overlying skin changes noted.  Given her age, and his symptoms, workup initiated which will include an abdominal pelvis CT scan for further assessment.  Pain medication offered but patient declined at this time.  -Labs ordered, independently viewed and interpreted by me.  Labs remarkable for CBG 260 with normal anion gap.   -The patient was maintained on a cardiac monitor.  I personally viewed and interpreted the cardiac monitored which showed an underlying rhythm of: NSR -Imaging independently viewed and interpreted by me and I agree with radiologist's interpretation.  Result remarkable for abd/pelvis CT showing marked sigmoid diverticulosis without evidence of diverticulitis.   -This patient presents to the ED for concern of abd pain, this involves an extensive number of treatment options, and is a complaint that carries with it a high risk of complications and morbidity.  The differential diagnosis includes colitis, diverticulitis, appendicitis, uti, kidney stone, pancreatitis -Co morbidities that complicate the patient evaluation includes GERD, stress incontinence, fibromyalgia, diverticulosis, hiatal hernia -Treatment includes insulin -Reevaluation of the patient after these medicines showed that the patient improved -PCP office notes or outside notes reviewed -Discussion with specialist attending Dr. Freida Busman -Escalation to admission/observation considered: patients feels much better, is comfortable with discharge,  and will follow up with PCP -Prescription medication considered, patient comfortable with home meds -Social Determinant of Health considered which includes depression, lack of mobility  4:47 PM Labs remarkable for a CBG of 260 with normal anion gap.  CT scan did show evidence of sigmoid diverticulosis without evidence of diverticulitis.  In the setting of having recurrent abdominal cramping as well as increased thirst and urination I suspect her abdominal discomfort is in the setting of hyperglycemia.  She is currently being weaned off from a lot of her home medication by her doctor as well.  She was on metformin in the past but did not tolerated.  At this time I felt patient should follow-up closely with PCP for recheck including hemoglobin A1c but I suspect patient is likely having undiagnosed diabetes causing her symptoms.  I have low suspicion for cardiac illness causing her symptoms  After receiving 5 units of insulin and recheck in 30 minutes increment, CBG improved to 131.  Patient felt comfortable and requesting to be discharged home.  She will follow-up with her doctor for further care.  Return precaution given.        Final Clinical Impression(s) / ED Diagnoses Final diagnoses:  Generalized abdominal pain  Hyperglycemia    Rx / DC Orders ED Discharge Orders     None         Fayrene Helper, PA-C 06/05/23 1746    Lorre Nick, MD 06/07/23 1345

## 2023-06-05 NOTE — ED Notes (Signed)
Recheck cbg at [facility] [employer]

## 2023-06-06 ENCOUNTER — Encounter: Payer: Self-pay | Admitting: Adult Health

## 2023-06-08 ENCOUNTER — Encounter: Payer: Self-pay | Admitting: Adult Health

## 2023-06-08 ENCOUNTER — Telehealth: Payer: Self-pay | Admitting: Gastroenterology

## 2023-06-08 MED ORDER — EMBRACE PRO GLUCOSE TEST VI STRP: ORAL_STRIP | 12 refills | Status: DC

## 2023-06-08 NOTE — Telephone Encounter (Signed)
Please advise 

## 2023-06-08 NOTE — Telephone Encounter (Signed)
PT is scheduled to come in for OV 7/31 and was exposed to covid on yesterday. She has not been tested but would like to know if the appointment should be cancelle

## 2023-06-08 NOTE — Telephone Encounter (Signed)
The pt was exposed late yesterday to COVID and want to know if she should keep appt for 7/31 for office visit.  I have made her aware to monitor for symptoms and let us know if she does get sick prior to the appt. She will monitor and call back if she develops Covid.

## 2023-06-10 ENCOUNTER — Ambulatory Visit (INDEPENDENT_AMBULATORY_CARE_PROVIDER_SITE_OTHER): Payer: Medicare HMO | Admitting: Adult Health

## 2023-06-10 ENCOUNTER — Encounter: Payer: Self-pay | Admitting: Adult Health

## 2023-06-10 VITALS — BP 110/64 | HR 82 | Temp 98.2°F | Ht 59.0 in | Wt 177.0 lb

## 2023-06-10 DIAGNOSIS — R7303 Prediabetes: Secondary | ICD-10-CM | POA: Diagnosis not present

## 2023-06-10 DIAGNOSIS — Z8669 Personal history of other diseases of the nervous system and sense organs: Secondary | ICD-10-CM

## 2023-06-10 DIAGNOSIS — R5382 Chronic fatigue, unspecified: Secondary | ICD-10-CM | POA: Diagnosis not present

## 2023-06-10 DIAGNOSIS — G8929 Other chronic pain: Secondary | ICD-10-CM

## 2023-06-10 DIAGNOSIS — R109 Unspecified abdominal pain: Secondary | ICD-10-CM

## 2023-06-10 LAB — POCT GLYCOSYLATED HEMOGLOBIN (HGB A1C): Hemoglobin A1C: 6.3 % — AB (ref 4.0–5.6)

## 2023-06-10 MED ORDER — BUTALBITAL-APAP-CAFFEINE 50-325-40 MG PO TABS
1.0000 | ORAL_TABLET | ORAL | 2 refills | Status: DC | PRN
Start: 2023-06-10 — End: 2024-04-03

## 2023-06-10 MED ORDER — AIMOVIG 70 MG/ML ~~LOC~~ SOAJ
70.0000 mg | SUBCUTANEOUS | 3 refills | Status: DC
Start: 2023-06-10 — End: 2023-10-27

## 2023-06-10 NOTE — Progress Notes (Signed)
Subjective:    [name], female    DOB: 09-Apr-1941, 82 y.o.   MRN: [id number]  HPI 82 year old female who  has a past medical history of Adenomatous colon polyp, Anxiety, Arthritis, Breast cancer (HCC) (08/08/2013), Cataract, Chronic insomnia, Cluster headaches, Depression, Diverticulosis, Fatty liver (2011), Fibromyalgia, GERD (gastroesophageal reflux disease), H/O hiatal hernia, History of transfusion (08/30/2013), radiation therapy (10/29/13- 12/14/13), Hypothyroidism, Internal hemorrhoids, Irritable bowel syndrome, Kidney stone, Lymphedema, Macular degeneration, MDD (major depressive disorder), Osteopenia, Osteopenia, Other abnormal glucose, Other and unspecified hyperlipidemia, Pain in joint, shoulder region, Pneumonia (4098,1191), Sleep apnea, and Stress incontinence, female.  She presents to the office today for follow up after being seen in the ER 5 days ago.   She does have a history of recurrent abdominal pain over the last several years and has been worked up multiple times without any definitive diagnosis.  When she presented to the emergency room she reported that she has had abdominal pain for the last 2 weeks and had increased frequency of abdominal discomfort.  She denied any fevers or chills, was not have any nausea vomiting diarrhea constipation, or dysuria.  Her labs were remarkable for a CBG of 260 with a normal anion gap.  She is unsure if this was a fasting glucose or not.  He had a CT abdomen pelvis which showed sigmoid diverticulosis without evidence of diverticulitis.  Does have an upcoming colonoscopy to have a polyp removed.  She continues to have abdominal discomfort with no rhyme or reason.  Today she is more concerned about her blood sugar being elevated as well as migraine headaches.  She has been on metformin in the past for prediabetes but did not tolerate this medication well and subsequently stopped it.  She is also has a longstanding history  of migraine headaches.  In the past she was on Topamax but stopped this due to fatigue aspect she is unsure what other medication she has tried.  Currently prescribed Fioricet and will take Tylenol.  She reports having at least 1 migraine a day and Fioricet will help abort these headaches.  He does report thankfully that since weaning herself off Cymbalta that her fatigue aspect has improved and she is also urinating less.  She is no longer taking midmorning or afternoon naps, fatigue is still present but she is getting out of the house and walking in her pool more to get exercise.    Review of Systems See HPI   Past Medical History:  Diagnosis Date   Adenomatous colon polyp    Anxiety    PHOBIAS   Arthritis    Breast cancer (HCC) 08/08/2013   right LOQ   Cataract    Chronic insomnia    Cluster headaches    history of migraines / NONE FOR SEVERAL YRS   Depression    Diverticulosis    Fatty liver 2011   Fibromyalgia    GERD (gastroesophageal reflux disease)    H/O hiatal hernia    History of transfusion 08/30/2013   Hx of radiation therapy 10/29/13- 12/14/13   right chest wall 5040 cGy 28 sessions, right supraclavicular/axillary region 5040 cGy 28 sessions, right chest wall boost 1000 cGy 5 sessions   Hypothyroidism    Internal hemorrhoids    Irritable bowel syndrome    Kidney stone    Lymphedema    RT ARM - WEARS SLEEVE   Macular degeneration    hole/right eye   MDD (major depressive disorder)  Osteopenia    Osteopenia    Other abnormal glucose    Other and unspecified hyperlipidemia    Pain in joint, shoulder region    Pneumonia 1610,9604   Sleep apnea    USES C-PAP   Stress incontinence, female     Social History   Socioeconomic History   Marital status: Married    Spouse name: [name]   Number of children: 3   Years of education: Not on file   Highest education level: GED or equivalent  Occupational History   Occupation: retired Catering manager  Tobacco Use    Smoking status: Former    Current packs/day: 0.00    Average packs/day: 0.1 packs/day for 2.0 years (0.2 ttl pk-yrs)    Types: Cigarettes    Start date: 11/16/1959    Quit date: 11/15/1960    Years since quitting: 62.6    Passive exposure: Past (over 58 years ago)   Smokeless tobacco: Never  Vaping Use   Vaping status: Never Used  Substance and Sexual Activity   Alcohol use: Yes    Comment: rarely   Drug use: No   Sexual activity: Yes    Comment: menarche age 36, fist live birth 47, P 3, hysterectomy age 66, no HRT, BCP 2 yrs  Other Topics Concern   Not on file  Social History Narrative   Occupation:  Retired Catering manager    Married with 3 grown children      Never Smoked     Alcohol use-no        Social Determinants of Health   Financial Resource Strain: Low Risk  (02/09/2023)   Overall Financial Resource Strain (CARDIA)    Difficulty of Paying Living Expenses: Not hard at all  Food Insecurity: No Food Insecurity (02/09/2023)   Hunger Vital Sign    Worried About Running Out of Food in the Last Year: Never true    Ran Out of Food in the Last Year: Never true  Transportation Needs: No Transportation Needs (02/09/2023)   PRAPARE - Administrator, Civil Service (Medical): No    Lack of Transportation (Non-Medical): No  Physical Activity: Inactive (02/09/2023)   Exercise Vital Sign    Days of Exercise per Week: 0 days    Minutes of Exercise per Session: 0 min  Stress: No Stress Concern Present (02/09/2023)   [employer] of Occupational Health - Occupational Stress Questionnaire    Feeling of Stress : Not at all  Social Connections: Socially Integrated (02/09/2023)   Social Connection and Isolation Panel [NHANES]    Frequency of Communication with Friends and Family: More than three times a week    Frequency of Social Gatherings with Friends and Family: More than three times a week    Attends Religious Services: More than 4 times per year    Active Member of [employer] or  Organizations: Yes    Attends Banker Meetings: More than 4 times per year    Marital Status: Married  Catering manager Violence: Not At Risk (02/09/2023)   Humiliation, Afraid, Rape, and Kick questionnaire    Fear of Current or Ex-Partner: No    Emotionally Abused: No    Physically Abused: No    Sexually Abused: No    Past Surgical History:  Procedure Laterality Date   ABDOMINAL HYSTERECTOMY     APPENDECTOMY     BILATERAL TOTAL MASTECTOMY WITH AXILLARY LYMPH NODE DISSECTION  08/30/2013   Dr Donell Beers   BREAST CYST ASPIRATION  9 cysts   CATARACT EXTRACTION, BILATERAL  2005/2007   CHOLECYSTECTOMY     COLONOSCOPY     CYSTOCELE REPAIR     EVACUATION BREAST HEMATOMA Left 08/31/2013   Procedure: EVACUATION HEMATOMA BREAST;  Surgeon: Almond Lint, MD;  Location: [facility] OR;  Service: General;  Laterality: Left;   EYE SURGERY     to repair macular hole   FOOT ARTHROPLASTY     lt    GANGLION CYST EXCISION     rt foot   HEMORRHOID SURGERY     03/1993   JOINT REPLACEMENT  03/15/11   left knee replacement   KNEE ARTHROSCOPY     /partial knee 2016/left knee 2012   MASS EXCISION  11/04/2011   Procedure: EXCISION MASS;  Surgeon: Wyn Forster., MD;  Location: [facility];  Service: Orthopedics;  Laterality: Right;  excisional biopsy right ulna mass   MASTECTOMY W/ SENTINEL NODE BIOPSY Right 08/30/2013   Procedure: RIGHT  AXILLARY SENTINEL LYMPH NODE BIOPSY; Right Axillary Node Disection;  Surgeon: Almond Lint, MD;  Location: [facility];  Service: General;  Laterality: Right;  Right side nuc med 7:00    PARTIAL KNEE ARTHROPLASTY Right 11/03/2015   Procedure: RIGHT KNEE MEDIAL UNICOMPARTMENTAL ARTHROPLASTY ;  Surgeon: Ollen Gross, MD;  Location: [facility];  Service: Orthopedics;  Laterality: Right;   RECTOCELE REPAIR     SIMPLE MASTECTOMY WITH AXILLARY SENTINEL NODE BIOPSY Left 08/30/2013   Procedure: Bilateral Breast Mastectomy ;  Surgeon: Almond Lint, MD;   Location: [facility];  Service: General;  Laterality: Left;   skin tags removed     breast, panty line, neckline   TOE SURGERY     preventative crossover toe surg/right foot   TOE SURGERY  2009   left foot/screw  in 2nd toe   TONSILLECTOMY     UPPER GASTROINTESTINAL ENDOSCOPY      Family History  Problem Relation Age of Onset   Stroke Mother        died age 76   Diabetes Mother    Breast cancer Mother 67   Breast cancer Sister 21   Brain cancer Maternal Uncle 8   Breast cancer Paternal Aunt 52   Breast cancer Paternal Aunt        dx in her 75s   Cancer Maternal Grandmother        intra-abdominal cancer   Diabetes Maternal Grandfather    Breast cancer Paternal Grandmother 24   Brain cancer Cousin 27       maternal cousin   Brain cancer Cousin 20       paternal cousin   Colon cancer Neg Hx    Colon polyps Neg Hx    Crohn's disease Neg Hx    Esophageal cancer Neg Hx    Rectal cancer Neg Hx    Stomach cancer Neg Hx    Ulcerative colitis Neg Hx     Allergies  Allergen Reactions   Metformin And Related Diarrhea    Current Outpatient Medications on File Prior to Visit  Medication Sig Dispense Refill   Bacillus Coagulans-Inulin (PROBIOTIC) 1-250 BILLION-MG CAPS Take by mouth.     butalbital-acetaminophen-caffeine (FIORICET) 50-325-40 MG tablet Take 1 tablet by mouth as needed for headache. 30 tablet 2   Calcium Carbonate-Vitamin D (CALCIUM-VITAMIN D3 PO) Take 1 tablet by mouth in the morning and at bedtime.     diazepam (VALIUM) 5 MG tablet Take 5 mg by mouth every 6 (six) hours as needed  for anxiety. As needed (flying & dental)     esomeprazole (NEXIUM) 40 MG capsule Take 1 capsule (40 mg total) by mouth daily at 12 noon. (Patient taking differently: Take 40 mg by mouth every morning.) 365 capsule 0   famotidine (PEPCID) 40 MG tablet TAKE 1 TABLET BY MOUTH AT BEDTIME 90 tablet 0   glucose blood (EMBRACE PRO GLUCOSE TEST) test strip Use to check blood sugar TID 200 each 12    levothyroxine (SYNTHROID) 100 MCG tablet TAKE 1 TABLET BY MOUTH DAILY 365 tablet 0   nitrofurantoin, macrocrystal-monohydrate, (MACROBID) 100 MG capsule Take 1 capsule (100 mg total) by mouth at bedtime. 365 capsule 0   omega-3 acid ethyl esters (LOVAZA) 1 g capsule TAKE ONE CAPSULE BY MOUTH TWICE A DAY 180 capsule 3   Polyethylene Glycol 3350 (MIRALAX PO) Take by mouth. 1/2 capful every morning     pregabalin (LYRICA) 50 MG capsule Take 1 capsule (50 mg total) by mouth daily. 90 capsule 0   No current facility-administered medications on file prior to visit.    BP 110/64   Pulse 82   Temp 98.2 F (36.8 C) (Oral)   Ht 4\' 11"  (1.499 m)   Wt 177 lb (80.3 kg)   SpO2 97%   BMI 35.75 kg/m       Objective:   Physical Exam Vitals and nursing note reviewed.  Constitutional:      Appearance: Normal appearance.  Cardiovascular:     Rate and Rhythm: Normal rate and regular rhythm.     Pulses: Normal pulses.     Heart sounds: Normal heart sounds.  Pulmonary:     Effort: Pulmonary effort is normal.     Breath sounds: Normal breath sounds.  Abdominal:     General: Bowel sounds are normal.     Palpations: Abdomen is soft.     Tenderness: There is generalized abdominal tenderness.  Musculoskeletal:        General: Normal range of motion.  Skin:    General: Skin is warm and dry.  Neurological:     General: No focal deficit present.     Mental Status: She is alert and oriented to person, place, and time.  Psychiatric:        Mood and Affect: Mood normal.        Behavior: Behavior normal.        Thought Content: Thought content normal.        Judgment: Judgment normal.        Assessment & Plan:  1. Prediabetes  - POC HgB A1c- 6.3 - Work on reducing carbs and sugars   2. Hx of migraine headaches - Will trial her on Aimovig - Erenumab-aooe (AIMOVIG) 70 MG/ML SOAJ; Inject 70 mg into the skin every 30 (thirty) days.  Dispense: 1.12 mL; Refill: 3 -  butalbital-acetaminophen-caffeine (FIORICET) 50-325-40 MG tablet; Take 1 tablet by mouth as needed for headache.  Dispense: 30 tablet; Refill: 2  3. Chronic abdominal pain - Unknown cause. She plans to talk to GI about this   4. Chronic fatigue - Improving - Encouraged to continue to exercise

## 2023-06-12 ENCOUNTER — Other Ambulatory Visit: Payer: Self-pay | Admitting: Adult Health

## 2023-06-12 DIAGNOSIS — R052 Subacute cough: Secondary | ICD-10-CM

## 2023-06-14 ENCOUNTER — Encounter: Payer: Self-pay | Admitting: Adult Health

## 2023-06-14 ENCOUNTER — Ambulatory Visit: Payer: Medicare HMO | Admitting: Adult Health

## 2023-06-14 NOTE — Telephone Encounter (Signed)
Called pharmacy and pt picked medication up.

## 2023-06-15 ENCOUNTER — Ambulatory Visit: Payer: Medicare HMO | Admitting: Gastroenterology

## 2023-06-16 ENCOUNTER — Encounter: Payer: Self-pay | Admitting: Adult Health

## 2023-06-16 ENCOUNTER — Other Ambulatory Visit: Payer: Self-pay | Admitting: Adult Health

## 2023-06-17 ENCOUNTER — Ambulatory Visit: Payer: Medicare HMO | Admitting: Obstetrics and Gynecology

## 2023-06-20 NOTE — Telephone Encounter (Signed)
Aug 21 AT 1030 AM is ok to use.

## 2023-06-20 NOTE — Telephone Encounter (Signed)
Patient needing to r/s ove before procedure in September. Please advise.   Thank you

## 2023-06-20 NOTE — Telephone Encounter (Signed)
There is a spot at 1030 that should be open. Let me check and If so I will schedule it. If you could please call her.  Thank you

## 2023-06-20 NOTE — Telephone Encounter (Signed)
Aug 21st has a 7 day hold. Please advise on scheduling

## 2023-06-20 NOTE — Telephone Encounter (Signed)
She is scheduled.  8/21 at 1030 am. Can you please let her know. Thank you

## 2023-06-21 NOTE — Telephone Encounter (Signed)
Spoke with patient husband regarding apr

## 2023-06-23 ENCOUNTER — Ambulatory Visit: Payer: Medicare HMO | Admitting: Podiatry

## 2023-06-24 ENCOUNTER — Encounter: Payer: Self-pay | Admitting: Adult Health

## 2023-06-24 NOTE — Telephone Encounter (Signed)
Pt spouse notified of update.  

## 2023-06-24 NOTE — Telephone Encounter (Signed)
PA filed and approved!   Jean Nash (KeyBarnie Alderman) PA Case ID #: [id number] Rx #: [id number] Need Help? Call us at [phone] Outcome Approved today by [facility] NCPDP 2017 Your request has been approved Authorization Expiration Date: 09/22/2023 Drug Aimovig 70MG /ML auto-injectors ePA cloud logo Form [employer] Electronic PA Form (2017 NCPDP) Original Claim Info [id number] PRECERT REQ BY MD [phone] REQUIRES PRIOR AUTHORIZATION

## 2023-07-01 ENCOUNTER — Ambulatory Visit (INDEPENDENT_AMBULATORY_CARE_PROVIDER_SITE_OTHER): Payer: Medicare HMO | Admitting: Obstetrics and Gynecology

## 2023-07-01 ENCOUNTER — Encounter: Payer: Self-pay | Admitting: Obstetrics and Gynecology

## 2023-07-01 DIAGNOSIS — N3281 Overactive bladder: Secondary | ICD-10-CM

## 2023-07-01 NOTE — Progress Notes (Signed)
[facility] Return Visit  SUBJECTIVE  History of Present Illness: Jean Nash is a 82 y.o. female seen in follow-up for overactive bladder. Had been using Gemtesa. Also tried myrbetriq and trospium.  She feels that all the medications she has tried has increased symptoms of urinary frequency. Has occasional frequency- urinating 5-6 times some days and 10-11 times other days. She leaks urine all the time.   Past Medical History: Patient  has a past medical history of Adenomatous colon polyp, Anxiety, Arthritis, Breast cancer (HCC) (08/08/2013), Cataract, Chronic insomnia, Cluster headaches, Depression, Diverticulosis, Fatty liver (2011), Fibromyalgia, GERD (gastroesophageal reflux disease), H/O hiatal hernia, History of transfusion (08/30/2013), radiation therapy (10/29/13- 12/14/13), Hypothyroidism, Internal hemorrhoids, Irritable bowel syndrome, Kidney stone, Lymphedema, Macular degeneration, MDD (major depressive disorder), Osteopenia, Osteopenia, Other abnormal glucose, Other and unspecified hyperlipidemia, Pain in joint, shoulder region, Pneumonia (5188,4166), Sleep apnea, and Stress incontinence, female.   Past Surgical History: She  has a past surgical history that includes Appendectomy; Cholecystectomy; Abdominal hysterectomy; Tonsillectomy; Eye surgery; Cataract extraction, bilateral (2005/2007); Knee arthroscopy; Joint replacement (03/15/11); Colonoscopy; Ganglion cyst excision; Foot arthroplasty; Mass excision (11/04/2011); Hemorrhoid surgery; Breast cyst aspiration; Toe Surgery; Toe Surgery (2009); Upper gastrointestinal endoscopy; skin tags removed; Bilateral total mastectomy with axillary lymph node dissection (08/30/2013); Simple mastectomy with axillary sentinel node biopsy (Left, 08/30/2013); Mastectomy w/ sentinel node biopsy (Right, 08/30/2013); Evacuation breast hematoma (Left, 08/31/2013); Partial knee arthroplasty (Right, 11/03/2015); Cystocele repair; and Rectocele  repair.   Medications: She has a current medication list which includes the following prescription(s): probiotic, butalbital-acetaminophen-caffeine, diazepam, aimovig, esomeprazole, famotidine, embrace pro glucose test, levothyroxine, nitrofurantoin (macrocrystal-monohydrate), omega-3 acid ethyl esters, polyethylene glycol 3350, calcium carb-cholecalciferol, and pregabalin.   Allergies: Patient is allergic to metformin and related.   Social History: Patient  reports that she quit smoking about 62 years ago. Her smoking use included cigarettes. She started smoking about 63 years ago. She has a 0.2 pack-year smoking history. She has been exposed to tobacco smoke. She has never used smokeless tobacco. She reports current alcohol use. She reports that she does not use drugs.      OBJECTIVE     Physical Exam: There were no vitals filed for this visit.  Gen: No apparent distress, A&O x 3.  Detailed Urogynecologic Evaluation:  Deferred.    ASSESSMENT AND PLAN    [name] is a 82 y.o. with:  1. Overactive bladder    - She and her husband have reviewed options presented to them and they are not interested in anything invasive.  - We reviewed the option of PTNS.  She understands it requires 12 weekly visits for temporary neuromodulation of the sacral nerve roots via the tibial nerve and that she may then require continued tapered treatment.   - She will keep a baseline bladder diary   Return for PTNS  Marguerita Beards, MD

## 2023-07-05 ENCOUNTER — Ambulatory Visit: Payer: Medicare HMO | Admitting: Podiatry

## 2023-07-05 ENCOUNTER — Other Ambulatory Visit: Payer: Self-pay | Admitting: Adult Health

## 2023-07-05 ENCOUNTER — Encounter: Payer: Self-pay | Admitting: Podiatry

## 2023-07-05 DIAGNOSIS — M79676 Pain in unspecified toe(s): Secondary | ICD-10-CM | POA: Diagnosis not present

## 2023-07-05 DIAGNOSIS — B351 Tinea unguium: Secondary | ICD-10-CM | POA: Diagnosis not present

## 2023-07-05 MED ORDER — NURTEC 75 MG PO TBDP: ORAL_TABLET | ORAL | 3 refills | Status: DC

## 2023-07-05 NOTE — Progress Notes (Signed)
Jean Nash presents today with her husband states that when my toes are moving when I am walking they hurt.  He is relating this because she cannot remember from day-to-day what hurts and she has had a stroke so her memory is horrible.  He does relate that she has significant back pain.  That she used to receive shots prior to COVID epidemic.  She has no pain when she is off of her feet.  Objective: Vital signs are stable alert oriented x 3 pulses remain strong and palpable.  Neurologic sensorium is intact deep tendon reflexes are intact muscle strength is normal symmetrical there is no crepitation on palpation or range of motion of any of the joints.  No reproducible pain no open lesions or wounds.  Assessment: Normal-appearing foot most likely symptomatology associated with back injury.  Plan: Follow-up for back injections.

## 2023-07-06 ENCOUNTER — Ambulatory Visit (INDEPENDENT_AMBULATORY_CARE_PROVIDER_SITE_OTHER): Payer: Medicare HMO | Admitting: Gastroenterology

## 2023-07-06 VITALS — BP 124/82 | HR 76 | Ht 59.0 in | Wt 177.0 lb

## 2023-07-06 DIAGNOSIS — Z86010 Personal history of colonic polyps: Secondary | ICD-10-CM

## 2023-07-06 DIAGNOSIS — D122 Benign neoplasm of ascending colon: Secondary | ICD-10-CM

## 2023-07-06 DIAGNOSIS — Z860101 Personal history of adenomatous and serrated colon polyps: Secondary | ICD-10-CM

## 2023-07-06 DIAGNOSIS — R933 Abnormal findings on diagnostic imaging of other parts of digestive tract: Secondary | ICD-10-CM | POA: Diagnosis not present

## 2023-07-06 NOTE — Patient Instructions (Signed)
You have been scheduled for a colonoscopy. Please follow written instructions given to you at your visit today.   Please pick up your prep supplies at the pharmacy within the next 1-3 days.  If you use inhalers (even only as needed), please bring them with you on the day of your procedure.  DO NOT TAKE 7 DAYS PRIOR TO TEST- Trulicity (dulaglutide) Ozempic, Wegovy (semaglutide) Mounjaro (tirzepatide) Bydureon Bcise (exanatide extended release)  DO NOT TAKE 1 DAY PRIOR TO YOUR TEST Rybelsus (semaglutide) Adlyxin (lixisenatide) Victoza (liraglutide) Byetta (exanatide) ___________________________________________________________________________  _______________________________________________________  If your blood pressure at your visit was 140/90 or greater, please contact your primary care physician to follow up on this.  _______________________________________________________  If you are age 15 or older, your body mass index should be between 23-30. Your Body mass index is 35.75 kg/m. If this is out of the aforementioned range listed, please consider follow up with your Primary Care Provider.  If you are age 44 or younger, your body mass index should be between 19-25. Your Body mass index is 35.75 kg/m. If this is out of the aformentioned range listed, please consider follow up with your Primary Care Provider.   ________________________________________________________  The [facility] providers would like to encourage you to use [facility] to communicate with providers for non-urgent requests or questions.  Due to long hold times on the telephone, sending your provider a message by [facility] may be a faster and more efficient way to get a response.  Please allow 48 business hours for a response.  Please remember that this is for non-urgent requests.  _______________________________________________________  Due to recent changes in healthcare laws, you may see the results of your imaging  and laboratory studies on MyChart before your provider has had a chance to review them.  We understand that in some cases there may be results that are confusing or concerning to you. Not all laboratory results come back in the same time frame and the provider may be waiting for multiple results in order to interpret others.  Please give [name] 48 hours in order for your provider to thoroughly review all the results before contacting the office for clarification of your results.   Thank you for choosing me and [facility].  Dr. Meridee Score

## 2023-07-06 NOTE — Progress Notes (Signed)
GASTROENTEROLOGY OUTPATIENT CLINIC VISIT   Primary Care Provider Shirline Frees, NP [address] [place] [address] [phone]  Referring Provider Dr. Lavon Paganini and Dr. Russella Dar  Patient Profile: Jean Nash is a 82 y.o. female with a pmh significant for previous breast cancer, nephrolithiasis, diabetes, hypothyroidism, migraines, hiatal hernia, GERD, IBS, colon polyps (TA's with ascending colon polyp in situ), diverticulosis.  The patient presents to the [facility] for an evaluation and management of problem(s) noted below:  Problem List 1. Adenomatous polyp of ascending colon   2. Hx of adenomatous colonic polyps   3. Abnormal colonoscopy   4. Abnormal CT scan, gastrointestinal tract     History of Present Illness Please see prior GI notes for full details of HPI.  Interval History This is a patient whom I am meeting as a referral for recent finding of a ascending colon polyp adenoma with need for attempt at advanced endoscopic resection.  Earlier this year the patient had undergone cross-sectional imaging and found to have an abnormality within the region of the hepatic flexure.  This led to a colonoscopy which was performed by Dr. Lavon Paganini on Dr. Ardell Isaacs behalf.  Colon polyps were found as well as a large ascending colon polyp.  This returned as an adenoma on pathology.  The patient has not had any other significant GI symptoms or changes in her bowel habits or blood in her stool.  She is scheduled for colonoscopy in the coming weeks.  GI Review of Systems Positive as above Negative for dysphagia, odynophagia, nausea, vomiting, pain, melena, hematochezia  Review of Systems General: Denies fevers/chills/weight loss unintentionally Cardiovascular: Denies chest pain Pulmonary: Denies shortness of breath Gastroenterological: See HPI Genitourinary: Denies darkened urine Hematological: Denies easy bruising/bleeding Dermatological: Denies  jaundice Psychological: Mood is stable   Medications Current Outpatient Medications  Medication Sig Dispense Refill   Bacillus Coagulans-Inulin (PROBIOTIC) 1-250 BILLION-MG CAPS Take by mouth.     butalbital-acetaminophen-caffeine (FIORICET) 50-325-40 MG tablet Take 1 tablet by mouth as needed for headache. 30 tablet 2   diazepam (VALIUM) 5 MG tablet Take 5 mg by mouth every 6 (six) hours as needed for anxiety. As needed (flying & dental)     Erenumab-aooe (AIMOVIG) 70 MG/ML SOAJ Inject 70 mg into the skin every 30 (thirty) days. 1.12 mL 3   esomeprazole (NEXIUM) 40 MG capsule Take 1 capsule (40 mg total) by mouth daily at 12 noon. (Patient taking differently: Take 40 mg by mouth every morning.) 365 capsule 0   famotidine (PEPCID) 40 MG tablet TAKE 1 TABLET BY MOUTH AT BEDTIME 90 tablet 0   glucose blood (EMBRACE PRO GLUCOSE TEST) test strip Use to check blood sugar TID 200 each 12   levothyroxine (SYNTHROID) 100 MCG tablet TAKE 1 TABLET BY MOUTH DAILY 365 tablet 0   nitrofurantoin, macrocrystal-monohydrate, (MACROBID) 100 MG capsule Take 1 capsule (100 mg total) by mouth at bedtime. 365 capsule 0   omega-3 acid ethyl esters (LOVAZA) 1 g capsule TAKE ONE CAPSULE BY MOUTH TWICE A DAY 180 capsule 3   Polyethylene Glycol 3350 (MIRALAX PO) Take by mouth. 1/2 capful every morning     pregabalin (LYRICA) 50 MG capsule Take 1 capsule (50 mg total) by mouth daily. 90 capsule 0   Rimegepant Sulfate (NURTEC) 75 MG TBDP Take 1 tablet as needed for acute migraine attacks 8 tablet 3   No current facility-administered medications for this visit.    Allergies Allergies  Allergen Reactions   Metformin And  Related Diarrhea    Histories Past Medical History:  Diagnosis Date   Adenomatous colon polyp    Anxiety    PHOBIAS   Arthritis    Breast cancer (HCC) 08/08/2013   right LOQ   Cataract    Chronic insomnia    Cluster headaches    history of migraines / NONE FOR SEVERAL YRS   Depression     Diverticulosis    Fatty liver 2011   Fibromyalgia    GERD (gastroesophageal reflux disease)    H/O hiatal hernia    History of transfusion 08/30/2013   Hx of radiation therapy 10/29/13- 12/14/13   right chest wall 5040 cGy 28 sessions, right supraclavicular/axillary region 5040 cGy 28 sessions, right chest wall boost 1000 cGy 5 sessions   Hypothyroidism    Internal hemorrhoids    Irritable bowel syndrome    Kidney stone    Lymphedema    RT ARM - WEARS SLEEVE   Macular degeneration    hole/right eye   MDD (major depressive disorder)    Osteopenia    Osteopenia    Other abnormal glucose    Other and unspecified hyperlipidemia    Pain in joint, shoulder region    Pneumonia 2130,8657   Sleep apnea    USES C-PAP   Stress incontinence, female    Past Surgical History:  Procedure Laterality Date   ABDOMINAL HYSTERECTOMY     APPENDECTOMY     BILATERAL TOTAL MASTECTOMY WITH AXILLARY LYMPH NODE DISSECTION  08/30/2013   Dr Donell Beers   BREAST CYST ASPIRATION     9 cysts   CATARACT EXTRACTION, BILATERAL  2005/2007   CHOLECYSTECTOMY     COLONOSCOPY     CYSTOCELE REPAIR     EVACUATION BREAST HEMATOMA Left 08/31/2013   Procedure: EVACUATION HEMATOMA BREAST;  Surgeon: Almond Lint, MD;  Location: [facility];  Service: General;  Laterality: Left;   EYE SURGERY     to repair macular hole   FOOT ARTHROPLASTY     lt    GANGLION CYST EXCISION     rt foot   HEMORRHOID SURGERY     03/1993   JOINT REPLACEMENT  03/15/11   left knee replacement   KNEE ARTHROSCOPY     /partial knee 2016/left knee 2012   MASS EXCISION  11/04/2011   Procedure: EXCISION MASS;  Surgeon: Wyn Forster., MD;  Location: [facility];  Service: Orthopedics;  Laterality: Right;  excisional biopsy right ulna mass   MASTECTOMY W/ SENTINEL NODE BIOPSY Right 08/30/2013   Procedure: RIGHT  AXILLARY SENTINEL LYMPH NODE BIOPSY; Right Axillary Node Disection;  Surgeon: Almond Lint, MD;  Location: [facility];  Service:  General;  Laterality: Right;  Right side nuc med 7:00    PARTIAL KNEE ARTHROPLASTY Right 11/03/2015   Procedure: RIGHT KNEE MEDIAL UNICOMPARTMENTAL ARTHROPLASTY ;  Surgeon: Ollen Gross, MD;  Location: [facility];  Service: Orthopedics;  Laterality: Right;   RECTOCELE REPAIR     SIMPLE MASTECTOMY WITH AXILLARY SENTINEL NODE BIOPSY Left 08/30/2013   Procedure: Bilateral Breast Mastectomy ;  Surgeon: Almond Lint, MD;  Location: [facility];  Service: General;  Laterality: Left;   skin tags removed     breast, panty line, neckline   TOE SURGERY     preventative crossover toe surg/right foot   TOE SURGERY  2009   left foot/screw  in 2nd toe   TONSILLECTOMY     UPPER GASTROINTESTINAL ENDOSCOPY     Social History  Socioeconomic History   Marital status: Married    Spouse name: [name]   Number of children: 3   Years of education: Not on file   Highest education level: GED or equivalent  Occupational History   Occupation: retired Catering manager  Tobacco Use   Smoking status: Former    Current packs/day: 0.00    Average packs/day: 0.1 packs/day for 2.0 years (0.2 ttl pk-yrs)    Types: Cigarettes    Start date: 11/16/1959    Quit date: 11/15/1960    Years since quitting: 62.6    Passive exposure: Past (over 58 years ago)   Smokeless tobacco: Never  Vaping Use   Vaping status: Never Used  Substance and Sexual Activity   Alcohol use: Yes    Comment: rarely   Drug use: No   Sexual activity: Yes    Comment: menarche age 43, fist live birth 58, P 3, hysterectomy age 77, no HRT, BCP 2 yrs  Other Topics Concern   Not on file  Social History Narrative   Occupation:  Retired Catering manager    Married with 3 grown children      Never Smoked     Alcohol use-no        Social Determinants of Health   Financial Resource Strain: Low Risk  (02/09/2023)   Overall Financial Resource Strain (CARDIA)    Difficulty of Paying Living Expenses: Not hard at all  Food Insecurity: No Food Insecurity (02/09/2023)    Hunger Vital Sign    Worried About Running Out of Food in the Last Year: Never true    Ran Out of Food in the Last Year: Never true  Transportation Needs: No Transportation Needs (02/09/2023)   PRAPARE - Administrator, Civil Service (Medical): No    Lack of Transportation (Non-Medical): No  Physical Activity: Inactive (02/09/2023)   Exercise Vital Sign    Days of Exercise per Week: 0 days    Minutes of Exercise per Session: 0 min  Stress: No Stress Concern Present (02/09/2023)   [employer] of Occupational Health - Occupational Stress Questionnaire    Feeling of Stress : Not at all  Social Connections: Socially Integrated (02/09/2023)   Social Connection and Isolation Panel [NHANES]    Frequency of Communication with Friends and Family: More than three times a week    Frequency of Social Gatherings with Friends and Family: More than three times a week    Attends Religious Services: More than 4 times per year    Active Member of [employer] or Organizations: Yes    Attends Engineer, structural: More than 4 times per year    Marital Status: Married  Catering manager Violence: Not At Risk (02/09/2023)   Humiliation, Afraid, Rape, and Kick questionnaire    Fear of Current or Ex-Partner: No    Emotionally Abused: No    Physically Abused: No    Sexually Abused: No   Family History  Problem Relation Age of Onset   Stroke Mother        died age 41   Diabetes Mother    Breast cancer Mother 76   Breast cancer Sister 70   Brain cancer Maternal Uncle 8   Breast cancer Paternal Aunt 34   Breast cancer Paternal Aunt        dx in her 31s   Cancer Maternal Grandmother        intra-abdominal cancer   Diabetes Maternal Grandfather    Breast cancer Paternal Grandmother  50   Brain cancer Cousin 73       maternal cousin   Brain cancer Cousin 20       paternal cousin   Colon cancer Neg Hx    Colon polyps Neg Hx    Crohn's disease Neg Hx    Esophageal cancer Neg Hx     Rectal cancer Neg Hx    Stomach cancer Neg Hx    Ulcerative colitis Neg Hx    Inflammatory bowel disease Neg Hx    Liver disease Neg Hx    Pancreatic cancer Neg Hx    I have reviewed her medical, social, and family history in detail and updated the electronic medical record as necessary.    PHYSICAL EXAMINATION  BP 124/82   Pulse 76   Ht 4\' 11"  (1.499 m)   Wt 177 lb (80.3 kg)   SpO2 98%   BMI 35.75 kg/m  Wt Readings from Last 3 Encounters:  07/06/23 177 lb (80.3 kg)  06/10/23 177 lb (80.3 kg)  06/05/23 172 lb (78 kg)  GEN: NAD, appears stated age, doesn't appear chronically ill PSYCH: Cooperative, without pressured speech EYE: Conjunctivae pink, sclerae anicteric ENT: MMM CV: Nontachycardic RESP: No audible wheezing GI: NABS, soft, NT/ND, without rebound MSK/EXT: No significant lower extremity edema SKIN: No jaundice NEURO:  Alert & Oriented x 3, no focal deficits   REVIEW OF DATA  I reviewed the following data at the time of this encounter:  GI Procedures and Studies  May 2024 colonoscopy - Two 9 to 12 mm polyps in the ascending colon, removed with a cold snare. Resected and retrieved. - One 25 mm polyp in the ascending colon. Biopsied. Tattooed. - Severe diverticulosis in the sigmoid colon, in the descending colon, in the transverse colon and in the ascending colon. - Non-bleeding external and internal hemorrhoids.  Pathology Diagnosis 1. Surgical [P], colon, ascending, hepatic flexure, polyp (2) FRAGMENTS OF TUBULAR ADENOMA. NEGATIVE FOR HIGH-GRADE DYSPLASIA OR MALIGNANCY. 2. Surgical [P], colon, ascending, polyp (1) TUBULAR ADENOMA. NEGATIVE FOR HIGH-GRADE DYSPLASIA OR MALIGNANCY.  Laboratory Studies  Reviewed those in epic  Imaging Studies  July 2024 CT abdomen pelvis with contrast IMPRESSION: 1. Marked sigmoid colonic diverticulosis without evidence of acute diverticulitis. 2. No evidence of nephrolithiasis or hydronephrosis. 3. Aortic  atherosclerosis. 4. 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 Grade 1 anterolisthesis of L3. Aortic Atherosclerosis (ICD10-I70.0).  March 2024 CT chest abdomen pelvis IMPRESSION: 1. No findings to suggest metastatic disease in the chest, abdomen or pelvis. 2. However, there is some focal mural thickening in the region of the hepatic flexure of the colon. The possibility of a developing colonic neoplasm in this region warrants consideration, and further evaluation with nonemergent colonoscopy is suggested in the near future to better evaluate this finding. 3. Colonic diverticulosis without evidence of acute diverticulitis at this time. 4. Aortic atherosclerosis, in addition to left anterior descending coronary artery disease. Assessment for potential risk factor modification, dietary therapy or pharmacologic therapy may be warranted, if clinically indicated. 5. Additional postoperative changes and incidental findings, as above.   ASSESSMENT  Ms. Vince is a 82 y.o. female with a pmh significant for previous breast cancer, nephrolithiasis, diabetes, hypothyroidism, migraines, hiatal hernia, GERD, IBS, colon polyps (TA's with ascending colon polyp in situ), diverticulosis.  The patient is seen today for evaluation and management of:  1. Adenomatous polyp of ascending colon   2. Hx of adenomatous colonic polyps   3. Abnormal colonoscopy  4. Abnormal CT scan, gastrointestinal tract    The patient is hemodynamically and clinically stable from a GI perspective.  Based upon the description and endoscopic pictures I do feel that it is reasonable to pursue an Advanced Polypectomy attempt of the polyp/lesion.  We discussed some of the techniques of advanced polypectomy which include Endoscopic Mucosal Resection, OVESCO Full-Thickness Resection, Endorotor Morcellation, and Tissue Ablation via Fulguration.  We also reviewed images of typical techniques as noted above.   The risks and benefits of endoscopic evaluation were discussed with the patient; these include but are not limited to the risk of perforation, infection, bleeding, missed lesions, lack of diagnosis, severe illness requiring hospitalization, as well as anesthesia and sedation related illnesses.  During attempts at advanced resection, the risks of bleeding and perforation/leak are increased as opposed to diagnostic and screening procedures, and that was discussed with the patient as well.   In addition, I explained that with the possible need for piecemeal resection, subsequent short-interval endoscopic evaluation for follow up and potential retreatment of the lesion/area may be necessary.  I did offer, a referral to surgery in order for patient to have opportunity to discuss surgical management/intervention prior to finalizing decision for attempt at endoscopic removal, however, the patient deferred on this.  If, after attempt at removal of the polyp/lesion, it is found that the patient has a complication or that an invasive lesion or malignant lesion is found, or that the polyp/lesion continues to recur, the patient is aware and understands that surgery may still be indicated/required.  All patient questions were answered, to the best of my ability, and the patient agrees to the aforementioned plan of action with follow-up as indicated.   PLAN  Proceed with scheduled colonoscopy with EMR attempt in the coming weeks Follow-up to be dictated based on results of colonoscopy   No orders of the defined types were placed in this encounter.   New Prescriptions   No medications on file   Modified Medications   No medications on file    Planned Follow Up No follow-ups on file.   Total Time in Face-to-Face and in Coordination of Care for patient including independent/personal interpretation/review of prior testing, medical history, examination, medication adjustment, communicating results with the  patient directly, and documentation within the EHR is 30 minutes.   Corliss Parish, MD [facility] Advanced Endoscopy Office # [phone]

## 2023-07-08 ENCOUNTER — Encounter: Payer: Self-pay | Admitting: Gastroenterology

## 2023-07-08 DIAGNOSIS — D122 Benign neoplasm of ascending colon: Secondary | ICD-10-CM | POA: Insufficient documentation

## 2023-07-08 DIAGNOSIS — Z86010 Personal history of colonic polyps: Secondary | ICD-10-CM | POA: Insufficient documentation

## 2023-07-08 DIAGNOSIS — R933 Abnormal findings on diagnostic imaging of other parts of digestive tract: Secondary | ICD-10-CM | POA: Insufficient documentation

## 2023-07-11 ENCOUNTER — Ambulatory Visit (HOSPITAL_BASED_OUTPATIENT_CLINIC_OR_DEPARTMENT_OTHER): Payer: Medicare HMO | Admitting: Psychiatry

## 2023-07-11 VITALS — BP 134/81 | HR 80 | Ht 60.0 in | Wt 177.0 lb

## 2023-07-11 DIAGNOSIS — M797 Fibromyalgia: Secondary | ICD-10-CM

## 2023-07-11 DIAGNOSIS — F3342 Major depressive disorder, recurrent, in full remission: Secondary | ICD-10-CM

## 2023-07-11 NOTE — Progress Notes (Signed)
BH MD/PA/NP OP Progress Note  07/11/2023 11:45 AM Jean Nash  MRN:  [id number]  Visit Diagnosis:    ICD-10-CM   1. MDD (major depressive disorder), recurrent, in full remission (HCC)  F33.42     2. Fibromyalgia  M79.7        Assessment: Jean Nash is a 82 y.o. female with a history of MDD, anxiety, and fibromyalgia who presented to [facility] at [facility] for initial evaluation on 08/11/22.    At initial evaluation patient reported a history of MDD but denied any current symptoms other than increased fatigue.  She noted that the increased fatigue seemed to correlate with when the pregabalin was increased for her fibromyalgia.  Patient denies any other psychiatric symptoms including psychosis, paranoia, delusions, SI/HI, thoughts of self-harm, or neurovegetative signs of depression.  Patient reported struggling with chronic pain symptoms as well as migraines with some benefit from the pregabalin.  She denied any benefit from the Topamax.  Treatment options were discussed and it was decided to taper off of Effexor and start Cymbalta for coverage of MDD and fibromyalgia symptoms.     [name] presents for follow-up evaluation. Today, 07/11/23, reports that mood is stable. PCP had tapered her off medications in the interim which Netasha has tolerated well. Concerns about sleep and memory were reviewed. Sleep hygiene discussed and memory concerns appears to be consistent with normal aging. Will continue to monitor for any concern of dementia. Patient will follow up in 6 months.  Plan: - Discontinue Cymbalta 60 mg QHS - Discontinue Topamax 25 mg QHS managed by PCP to prevent headaches - Tapering off Lyrica managed by PCP - CBC,CMP, TSH, and UA reviewed - Follow up in 6 months   Chief Complaint:  Chief Complaint  Patient presents with   Follow-up   HPI: PCP had patient taper Cymbalta to every 3rd day in the interim due to concern of excess fatigue. Patient  had some moderate withdrawal with the medication discontinuation including clamminess. Over time withdrawal symptoms improved and patient was able to discontinue the medication.  Jean Nash presents alongside her husband.  She reports that things have gone well over the past 6 months. She has tapered off a number of her medications with PCP instruction during this period and found no significant change in her mood symptoms or other related issues for which she took medications. She has had improvement in her overall fatigue levels with the discontinuation of many medications.   Today her primary concerns are the short term memory loss and some difficulty with sleep initiation. In regards to sleep, they have been tracking it with their bed, which shows Mirola sleeps around 8 hours a night and is awake in bed for around 1.5 hours. She also does take a 1-2 hour nap after breakfast. Reviewed sleep hygiene and some techniques to help manage difficulty with sleep initiation. Patient memory issues appear to be consistent with normal aging. It is possible there is some association with the Lyrica but that is currently in the process of being tapered off. Can continue to monitor for signs of dementia. That does not appear to be a concern at this time however at this time.  Past Psychiatric History: Patient has seen Dr. [name] in the past Psychiatry admission: denies  Previous suicide attempt: denies Past trials of medication: citalopram, and Effexor.  Patient has also taken Topamax for migraines and pregabalin for small fiber neuropathy.  Past Medical History:  Past Medical History:  Diagnosis  Date   Adenomatous colon polyp    Anxiety    PHOBIAS   Arthritis    Breast cancer (HCC) 08/08/2013   right LOQ   Cataract    Chronic insomnia    Cluster headaches    history of migraines / NONE FOR SEVERAL YRS   Depression    Diverticulosis    Fatty liver 2011   Fibromyalgia    GERD (gastroesophageal reflux  disease)    H/O hiatal hernia    History of transfusion 08/30/2013   Hx of radiation therapy 10/29/13- 12/14/13   right chest wall 5040 cGy 28 sessions, right supraclavicular/axillary region 5040 cGy 28 sessions, right chest wall boost 1000 cGy 5 sessions   Hypothyroidism    Internal hemorrhoids    Irritable bowel syndrome    Kidney stone    Lymphedema    RT ARM - WEARS SLEEVE   Macular degeneration    hole/right eye   MDD (major depressive disorder)    Osteopenia    Osteopenia    Other abnormal glucose    Other and unspecified hyperlipidemia    Pain in joint, shoulder region    Pneumonia 1610,9604   Sleep apnea    USES C-PAP   Stress incontinence, female     Past Surgical History:  Procedure Laterality Date   ABDOMINAL HYSTERECTOMY     APPENDECTOMY     BILATERAL TOTAL MASTECTOMY WITH AXILLARY LYMPH NODE DISSECTION  08/30/2013   Dr Donell Beers   BREAST CYST ASPIRATION     9 cysts   CATARACT EXTRACTION, BILATERAL  2005/2007   CHOLECYSTECTOMY     COLONOSCOPY     CYSTOCELE REPAIR     EVACUATION BREAST HEMATOMA Left 08/31/2013   Procedure: EVACUATION HEMATOMA BREAST;  Surgeon: Almond Lint, MD;  Location: [facility];  Service: General;  Laterality: Left;   EYE SURGERY     to repair macular hole   FOOT ARTHROPLASTY     lt    GANGLION CYST EXCISION     rt foot   HEMORRHOID SURGERY     03/1993   JOINT REPLACEMENT  03/15/11   left knee replacement   KNEE ARTHROSCOPY     /partial knee 2016/left knee 2012   MASS EXCISION  11/04/2011   Procedure: EXCISION MASS;  Surgeon: Wyn Forster., MD;  Location: [facility];  Service: Orthopedics;  Laterality: Right;  excisional biopsy right ulna mass   MASTECTOMY W/ SENTINEL NODE BIOPSY Right 08/30/2013   Procedure: RIGHT  AXILLARY SENTINEL LYMPH NODE BIOPSY; Right Axillary Node Disection;  Surgeon: Almond Lint, MD;  Location: [facility] OR;  Service: General;  Laterality: Right;  Right side nuc med 7:00    PARTIAL KNEE ARTHROPLASTY  Right 11/03/2015   Procedure: RIGHT KNEE MEDIAL UNICOMPARTMENTAL ARTHROPLASTY ;  Surgeon: Ollen Gross, MD;  Location: [facility];  Service: Orthopedics;  Laterality: Right;   RECTOCELE REPAIR     SIMPLE MASTECTOMY WITH AXILLARY SENTINEL NODE BIOPSY Left 08/30/2013   Procedure: Bilateral Breast Mastectomy ;  Surgeon: Almond Lint, MD;  Location: [facility];  Service: General;  Laterality: Left;   skin tags removed     breast, panty line, neckline   TOE SURGERY     preventative crossover toe surg/right foot   TOE SURGERY  2009   left foot/screw  in 2nd toe   TONSILLECTOMY     UPPER GASTROINTESTINAL ENDOSCOPY      Family Psychiatric History: Denies  Family History:  Family History  Problem  Relation Age of Onset   Stroke Mother        died age 80   Diabetes Mother    Breast cancer Mother 47   Breast cancer Sister 51   Brain cancer Maternal Uncle 8   Breast cancer Paternal Aunt 31   Breast cancer Paternal Aunt        dx in her 58s   Cancer Maternal Grandmother        intra-abdominal cancer   Diabetes Maternal Grandfather    Breast cancer Paternal Grandmother 76   Brain cancer Cousin 62       maternal cousin   Brain cancer Cousin 20       paternal cousin   Colon cancer Neg Hx    Colon polyps Neg Hx    Crohn's disease Neg Hx    Esophageal cancer Neg Hx    Rectal cancer Neg Hx    Stomach cancer Neg Hx    Ulcerative colitis Neg Hx    Inflammatory bowel disease Neg Hx    Liver disease Neg Hx    Pancreatic cancer Neg Hx     Social History:  Social History   Socioeconomic History   Marital status: Married    Spouse name: [name]   Number of children: 3   Years of education: Not on file   Highest education level: GED or equivalent  Occupational History   Occupation: retired Catering manager  Tobacco Use   Smoking status: Former    Current packs/day: 0.00    Average packs/day: 0.1 packs/day for 2.0 years (0.2 ttl pk-yrs)    Types: Cigarettes    Start date: 11/16/1959    Quit date:  11/15/1960    Years since quitting: 62.6    Passive exposure: Past (over 58 years ago)   Smokeless tobacco: Never  Vaping Use   Vaping status: Never Used  Substance and Sexual Activity   Alcohol use: Yes    Comment: rarely   Drug use: No   Sexual activity: Yes    Comment: menarche age 59, fist live birth 59, P 3, hysterectomy age 61, no HRT, BCP 2 yrs  Other Topics Concern   Not on file  Social History Narrative   Occupation:  Retired Catering manager    Married with 3 grown children      Never Smoked     Alcohol use-no        Social Determinants of Health   Financial Resource Strain: Low Risk  (02/09/2023)   Overall Financial Resource Strain (CARDIA)    Difficulty of Paying Living Expenses: Not hard at all  Food Insecurity: No Food Insecurity (02/09/2023)   Hunger Vital Sign    Worried About Running Out of Food in the Last Year: Never true    Ran Out of Food in the Last Year: Never true  Transportation Needs: No Transportation Needs (02/09/2023)   PRAPARE - Administrator, Civil Service (Medical): No    Lack of Transportation (Non-Medical): No  Physical Activity: Inactive (02/09/2023)   Exercise Vital Sign    Days of Exercise per Week: 0 days    Minutes of Exercise per Session: 0 min  Stress: No Stress Concern Present (02/09/2023)   [employer] of Occupational Health - Occupational Stress Questionnaire    Feeling of Stress : Not at all  Social Connections: Socially Integrated (02/09/2023)   Social Connection and Isolation Panel [NHANES]    Frequency of Communication with Friends and Family: More than three times  a week    Frequency of Social Gatherings with Friends and Family: More than three times a week    Attends Religious Services: More than 4 times per year    Active Member of [employer] or Organizations: Yes    Attends Engineer, structural: More than 4 times per year    Marital Status: Married    Allergies:  Allergies  Allergen Reactions   Metformin  And Related Diarrhea    Current Medications: Current Outpatient Medications  Medication Sig Dispense Refill   Bacillus Coagulans-Inulin (PROBIOTIC) 1-250 BILLION-MG CAPS Take by mouth.     butalbital-acetaminophen-caffeine (FIORICET) 50-325-40 MG tablet Take 1 tablet by mouth as needed for headache. 30 tablet 2   diazepam (VALIUM) 5 MG tablet Take 5 mg by mouth every 6 (six) hours as needed for anxiety. As needed (flying & dental)     Erenumab-aooe (AIMOVIG) 70 MG/ML SOAJ Inject 70 mg into the skin every 30 (thirty) days. 1.12 mL 3   esomeprazole (NEXIUM) 40 MG capsule Take 1 capsule (40 mg total) by mouth daily at 12 noon. (Patient taking differently: Take 40 mg by mouth every morning.) 365 capsule 0   famotidine (PEPCID) 40 MG tablet TAKE 1 TABLET BY MOUTH AT BEDTIME 90 tablet 0   glucose blood (EMBRACE PRO GLUCOSE TEST) test strip Use to check blood sugar TID 200 each 12   levothyroxine (SYNTHROID) 100 MCG tablet TAKE 1 TABLET BY MOUTH DAILY 365 tablet 0   nitrofurantoin, macrocrystal-monohydrate, (MACROBID) 100 MG capsule Take 1 capsule (100 mg total) by mouth at bedtime. 365 capsule 0   omega-3 acid ethyl esters (LOVAZA) 1 g capsule TAKE ONE CAPSULE BY MOUTH TWICE A DAY 180 capsule 3   Polyethylene Glycol 3350 (MIRALAX PO) Take by mouth. 1/2 capful every morning     pregabalin (LYRICA) 50 MG capsule Take 1 capsule (50 mg total) by mouth daily. 90 capsule 0   Rimegepant Sulfate (NURTEC) 75 MG TBDP Take 1 tablet as needed for acute migraine attacks 8 tablet 3   No current facility-administered medications for this visit.     Musculoskeletal: Strength & Muscle Tone: within normal limits Gait & Station: normal, unsteady Patient leans: N/A  Psychiatric Specialty Exam: Review of Systems  Blood pressure 134/81, pulse 80, height 5' (1.524 m), weight 177 lb (80.3 kg).Body mass index is 34.57 kg/m.  General Appearance: Well Groomed  Eye Contact:  Good  Speech:  Clear and Coherent and  Normal Rate  Volume:  Normal  Mood:  Anxious and Euthymic  Affect:  Congruent  Thought Process:  Coherent and Goal Directed  Orientation:  Full (Time, Place, and Person)  Thought Content: Logical   Suicidal Thoughts:  No  Homicidal Thoughts:  No  Memory:  NA  Judgement:  Good  Insight:  Good  Psychomotor Activity:  Normal  Concentration:  Concentration: Good  Recall:  Fair  Fund of Knowledge: Fair  Language: Good  Akathisia:  NA    AIMS (if indicated): not done  Assets:  Communication Skills Desire for Improvement Housing Intimacy Leisure Time Resilience Social Support  ADL's:  Intact  Cognition: WNL  Sleep:  Good   Metabolic Disorder Labs: Lab Results  Component Value Date   HGBA1C 6.3 (A) 06/10/2023   MPG 126 (H) 08/27/2013   MPG 117 (H) 06/22/2013   Lab Results  Component Value Date   PROLACTIN 3.9 07/22/2009   Lab Results  Component Value Date   CHOL 153 03/03/2023  TRIG 161 (H) 03/03/2023   HDL 51 03/03/2023   CHOLHDL 3.0 03/03/2023   VLDL 16.1 05/25/2022   LDLCALC 75 03/03/2023   LDLCALC 133 (H) 05/25/2022   Lab Results  Component Value Date   TSH 1.150 03/03/2023   TSH 1.89 05/25/2022    Therapeutic Level Labs: No results found for: "LITHIUM" No results found for: "VALPROATE" No results found for: "CBMZ"   Screenings: GAD-7    Flowsheet Row Office Visit from 01/03/2023 in [facility] [place]  Total GAD-7 Score 7      Mini-Mental    Flowsheet Row Office Visit from 03/03/2023 in [facility] Office Visit from 02/03/2023 in [facility] [place] Office Visit from 10/27/2022 in [facility] [place]  Total Score (max 30 points ) 26 28 29       PHQ2-9    Flowsheet Row Office Visit from 03/15/2023 in [facility] Clinical Support from 02/09/2023 in [facility] [place] Office Visit from  01/03/2023 in [facility] [place] Video Visit from 11/01/2022 in [facility] [place] Office Visit from [facility]/11/2022 [facility]  PHQ-2 Total Score 4 0 4 0 0  PHQ-9 Total Score 14 0 15 4 --      Flowsheet Row ED from 06/05/2023 in [facility] Emergency Department at [facility] Office Visit from 08/11/2022 in [facility] ED from 08/08/2022 in [facility] Emergency Department at [facility]  C-SSRS RISK CATEGORY No Risk No Risk No Risk       Collaboration of Care: Collaboration of Care: Medication Management AEB medication prescription, Primary Care Provider AEB chart review, and Other provider involved in patient's care AEB GI chart review  Patient/Guardian was advised Release of Information must be obtained prior to any record release in order to collaborate their care with an outside provider. Patient/Guardian was advised if they have not already done so to contact the registration department to sign all necessary forms in order for [name] to release information regarding their care.   Consent: Patient/Guardian gives verbal consent for treatment and assignment of benefits for services provided during this visit. Patient/Guardian expressed understanding and agreed to proceed.   40 minutes were spent in chart review, interview, psycho education, counseling, medical decision making, coordination of care and long-term prognosis.  Patient was given opportunity to ask question and all concerns and questions were addressed and answers. Excluding separately billable services.   Stasia Cavalier, MD 07/11/2023, 11:45 AM

## 2023-07-12 ENCOUNTER — Ambulatory Visit: Payer: Medicare HMO | Admitting: Obstetrics and Gynecology

## 2023-07-13 ENCOUNTER — Other Ambulatory Visit (HOSPITAL_COMMUNITY): Payer: Self-pay

## 2023-07-13 NOTE — Telephone Encounter (Signed)
Pt spouse notified that Nurtec has been approved. Outcome Approved on August 27 by [facility] NCPDP 2017 Your request has been approved Authorization Expiration Date: 11/15/2023 Drug Nurtec 75MG  dispersible tablets ePA cloud Psychologist, educational Medicare Electronic [place] Form [phone] NCPDP) Original Claim Info [id number] [phone]

## 2023-07-14 ENCOUNTER — Encounter (HOSPITAL_COMMUNITY): Payer: Self-pay | Admitting: Gastroenterology

## 2023-07-19 ENCOUNTER — Ambulatory Visit: Payer: Medicare HMO | Admitting: Obstetrics and Gynecology

## 2023-07-19 ENCOUNTER — Telehealth: Payer: Self-pay | Admitting: Gastroenterology

## 2023-07-19 ENCOUNTER — Telehealth: Payer: Self-pay | Admitting: Obstetrics and Gynecology

## 2023-07-19 NOTE — Telephone Encounter (Signed)
Pt husband called in today to cancel future appts due to him being hospitalized and currently being treated for possible pancreatic cancer.

## 2023-07-19 NOTE — Telephone Encounter (Signed)
Patty, With patient's husband's recent possible pancreas cancer diagnosis they wanted to hold on procedure. Put a recall in the system for at least 2 months out so that she can hopefully reschedule since it takes [name] quite a while based on my schedule to get her back on the list. Thanks. GM

## 2023-07-19 NOTE — Telephone Encounter (Signed)
Recall has been entered  

## 2023-07-19 NOTE — Telephone Encounter (Signed)
Patients spouse called to cancel the hospital procedure for now because he might have pancreatic cancer that they would have to handle first.

## 2023-07-19 NOTE — Telephone Encounter (Signed)
The pt has been advised that I received her message and have cancelled the appt. She states she will call to reschedule when she is able.

## 2023-07-25 ENCOUNTER — Ambulatory Visit (HOSPITAL_COMMUNITY): Admit: 2023-07-25 | Payer: Medicare HMO | Admitting: Gastroenterology

## 2023-07-25 SURGERY — COLONOSCOPY WITH PROPOFOL
Anesthesia: Monitor Anesthesia Care

## 2023-07-26 ENCOUNTER — Ambulatory Visit: Payer: Medicare HMO | Admitting: Obstetrics and Gynecology

## 2023-07-27 ENCOUNTER — Encounter (HOSPITAL_COMMUNITY): Payer: Self-pay

## 2023-07-27 ENCOUNTER — Other Ambulatory Visit: Payer: Self-pay | Admitting: Physician Assistant

## 2023-07-27 DIAGNOSIS — F3342 Major depressive disorder, recurrent, in full remission: Secondary | ICD-10-CM

## 2023-07-28 MED ORDER — DULOXETINE HCL 30 MG PO CPEP: 30.0000 mg | ORAL_CAPSULE | Freq: Every evening | ORAL | 0 refills | Status: DC

## 2023-07-28 NOTE — Telephone Encounter (Signed)
Patient was contacted via telephone for approximately 5 minutes  She presented with her husband. Her husband reports that he was recently at his PCP and was told to go to the hospital emergently after bloodwork came back elevated. There is concern for pancreatic cancer and he is awaiting the results of the biopsy. With this increased stress Jean Nash has begun to feel more depressed again. We discussed options and both patient and her husband had felt that the duloxetine was beneficial. We will restart it today at a 30 mg dose. Risks and benefits reviewed. Patient instructed to reach out again if any further concerns.

## 2023-07-31 ENCOUNTER — Encounter: Payer: Self-pay | Admitting: Adult Health

## 2023-08-04 ENCOUNTER — Ambulatory Visit: Payer: Medicare HMO | Admitting: Obstetrics and Gynecology

## 2023-08-09 ENCOUNTER — Ambulatory Visit: Payer: Medicare HMO | Admitting: Obstetrics and Gynecology

## 2023-08-16 ENCOUNTER — Ambulatory Visit: Payer: Medicare HMO | Admitting: Obstetrics and Gynecology

## 2023-08-22 ENCOUNTER — Other Ambulatory Visit: Payer: Self-pay | Admitting: Adult Health

## 2023-08-22 DIAGNOSIS — Z760 Encounter for issue of repeat prescription: Secondary | ICD-10-CM

## 2023-09-01 ENCOUNTER — Telehealth: Payer: Self-pay | Admitting: Gastroenterology

## 2023-09-01 NOTE — Telephone Encounter (Signed)
The pt has been rescheduled for 12/26 at 8 am with GM at [facility] instructions have been mailed and sent to my chart.    They already have the prep on  hand.  They will call back with any further concerns or issues

## 2023-09-01 NOTE — Telephone Encounter (Signed)
Inbound call from patient's husband wishing to reschedule patient colonoscopy from 9/9. Husband stated more family would be coming in on November 8th that would be able to be a care partner for patient. Husband is requesting to know if November 8th is available. Please advise, thank you.

## 2023-09-06 ENCOUNTER — Ambulatory Visit: Payer: Medicare HMO | Admitting: Adult Health

## 2023-09-07 ENCOUNTER — Ambulatory Visit (INDEPENDENT_AMBULATORY_CARE_PROVIDER_SITE_OTHER): Payer: Medicare HMO | Admitting: Adult Health

## 2023-09-07 ENCOUNTER — Encounter: Payer: Self-pay | Admitting: Adult Health

## 2023-09-07 VITALS — BP 120/82 | HR 84 | Temp 98.2°F | Ht 60.0 in | Wt 176.0 lb

## 2023-09-07 DIAGNOSIS — R5382 Chronic fatigue, unspecified: Secondary | ICD-10-CM

## 2023-09-07 DIAGNOSIS — Z8669 Personal history of other diseases of the nervous system and sense organs: Secondary | ICD-10-CM | POA: Diagnosis not present

## 2023-09-07 DIAGNOSIS — R350 Frequency of micturition: Secondary | ICD-10-CM

## 2023-09-07 LAB — POCT URINALYSIS DIPSTICK
Bilirubin, UA: NEGATIVE
Blood, UA: NEGATIVE
Glucose, UA: NEGATIVE
Ketones, UA: NEGATIVE
Leukocytes, UA: NEGATIVE
Nitrite, UA: NEGATIVE
Protein, UA: POSITIVE — AB
Spec Grav, UA: >=1.03 — AB (ref 1.010–1.025)
Urobilinogen, UA: 0.2 U/dL
pH, UA: 6 (ref 5.0–8.0)

## 2023-09-07 MED ORDER — AIMOVIG 140 MG/ML ~~LOC~~ SOAJ
140.0000 mg | SUBCUTANEOUS | 3 refills | Status: DC
Start: 2023-09-07 — End: 2023-10-27

## 2023-09-07 NOTE — Progress Notes (Signed)
Subjective:    [name], female    DOB: 03/30/1941, 82 y.o.   MRN: [id number]  HPI 82 year old female who  has a past medical history of Adenomatous colon polyp, Anxiety, Arthritis, Breast cancer (HCC) (08/08/2013), Cataract, Chronic insomnia, Cluster headaches, Depression, Diverticulosis, Fatty liver (2011), Fibromyalgia, GERD (gastroesophageal reflux disease), H/O hiatal hernia, History of transfusion (08/30/2013), radiation therapy (10/29/13- 12/14/13), Hypothyroidism, Internal hemorrhoids, Irritable bowel syndrome, Kidney stone, Lymphedema, Macular degeneration, MDD (major depressive disorder), Osteopenia, Osteopenia, Other abnormal glucose, Other and unspecified hyperlipidemia, Pain in joint, shoulder region, Pneumonia (7829,5621), Sleep apnea, and Stress incontinence, female.  She presents to the office today for concern of a UTI. Her symptoms include frequent urination.   She does have a known history of OAB and frequent UTI's  She has been seen by Dr. Florian Buff with UroGYN. In the past she has tried [employer], trospium and [place]. She feels as though these medications made her symptoms worse. Will urinate 5-6 times some days and 10+ times other days. When she was last seen in August she did not want to have anything done invasively, she was to return to [facility]   She is taking macrobid daily to help prevent UTI's.   She also has a history of Migraine headaches and has been trialed on multiple medication in the past.  She is currently has been on Aimovig 70 mg for the last three months. She does not feel like her symptoms are getting any better and feels as though they are getting worse. Despite using Nurtec.  Chronic Fatigue - has been worked up multiples times without a cause. She reports that she will often wake up in the morning and soon after breakfast go back to sleep because she is tired. She will also need to take an afternoon nap. She does not sleep well at night due  to sleeping during the day. She does have a history os OSA with her last sleep study being in 2016.  Her husband reports today that she does continue to snore but states "not as bad as when she was heavier and I have not witnessed any apneic episodes".   Review of Systems See HPI   Past Medical History:  Diagnosis Date   Adenomatous colon polyp    Anxiety    PHOBIAS   Arthritis    Breast cancer (HCC) 08/08/2013   right LOQ   Cataract    Chronic insomnia    Cluster headaches    history of migraines / NONE FOR SEVERAL YRS   Depression    Diverticulosis    Fatty liver 2011   Fibromyalgia    GERD (gastroesophageal reflux disease)    H/O hiatal hernia    History of transfusion 08/30/2013   Hx of radiation therapy 10/29/13- 12/14/13   right chest wall 5040 cGy 28 sessions, right supraclavicular/axillary region 5040 cGy 28 sessions, right chest wall boost 1000 cGy 5 sessions   Hypothyroidism    Internal hemorrhoids    Irritable bowel syndrome    Kidney stone    Lymphedema    RT ARM - WEARS SLEEVE   Macular degeneration    hole/right eye   MDD (major depressive disorder)    Osteopenia    Osteopenia    Other abnormal glucose    Other and unspecified hyperlipidemia    Pain in joint, shoulder region    Pneumonia 3086,5784   Sleep apnea    USES C-PAP   Stress incontinence, female  Social History   Socioeconomic History   Marital status: Married    Spouse name: [name]   Number of children: 3   Years of education: Not on file   Highest education level: GED or equivalent  Occupational History   Occupation: retired Catering manager  Tobacco Use   Smoking status: Former    Current packs/day: 0.00    Average packs/day: 0.1 packs/day for 2.0 years (0.2 ttl pk-yrs)    Types: Cigarettes    Start date: 11/16/1959    Quit date: 11/15/1960    Years since quitting: 62.8    Passive exposure: Past (over 58 years ago)   Smokeless tobacco: Never  Vaping Use   Vaping status: Never Used   Substance and Sexual Activity   Alcohol use: Yes    Comment: rarely   Drug use: No   Sexual activity: Yes    Comment: menarche age 78, fist live birth 9, P 3, hysterectomy age 64, no HRT, BCP 2 yrs  Other Topics Concern   Not on file  Social History Narrative   Occupation:  Retired Catering manager    Married with 3 grown children      Never Smoked     Alcohol use-no        Social Determinants of Health   Financial Resource Strain: Low Risk  (02/09/2023)   Overall Financial Resource Strain (CARDIA)    Difficulty of Paying Living Expenses: Not hard at all  Food Insecurity: No Food Insecurity (02/09/2023)   Hunger Vital Sign    Worried About Running Out of Food in the Last Year: Never true    Ran Out of Food in the Last Year: Never true  Transportation Needs: No Transportation Needs (02/09/2023)   PRAPARE - Administrator, Civil Service (Medical): No    Lack of Transportation (Non-Medical): No  Physical Activity: Inactive (02/09/2023)   Exercise Vital Sign    Days of Exercise per Week: 0 days    Minutes of Exercise per Session: 0 min  Stress: No Stress Concern Present (02/09/2023)   [employer] of Occupational Health - Occupational Stress Questionnaire    Feeling of Stress : Not at all  Social Connections: Socially Integrated (02/09/2023)   Social Connection and Isolation Panel [NHANES]    Frequency of Communication with Friends and Family: More than three times a week    Frequency of Social Gatherings with Friends and Family: More than three times a week    Attends Religious Services: More than 4 times per year    Active Member of [employer] or Organizations: Yes    Attends Banker Meetings: More than 4 times per year    Marital Status: Married  Catering manager Violence: Not At Risk (02/09/2023)   Humiliation, Afraid, Rape, and Kick questionnaire    Fear of Current or Ex-Partner: No    Emotionally Abused: No    Physically Abused: No    Sexually Abused: No     Past Surgical History:  Procedure Laterality Date   ABDOMINAL HYSTERECTOMY     APPENDECTOMY     BILATERAL TOTAL MASTECTOMY WITH AXILLARY LYMPH NODE DISSECTION  08/30/2013   Dr Donell Beers   BREAST CYST ASPIRATION     9 cysts   CATARACT EXTRACTION, BILATERAL  2005/2007   CHOLECYSTECTOMY     COLONOSCOPY     CYSTOCELE REPAIR     EVACUATION BREAST HEMATOMA Left 08/31/2013   Procedure: EVACUATION HEMATOMA BREAST;  Surgeon: Almond Lint, MD;  Location: [facility];  Service: General;  Laterality: Left;   EYE SURGERY     to repair macular hole   FOOT ARTHROPLASTY     lt    GANGLION CYST EXCISION     rt foot   HEMORRHOID SURGERY     03/1993   JOINT REPLACEMENT  03/15/11   left knee replacement   KNEE ARTHROSCOPY     /partial knee 2016/left knee 2012   MASS EXCISION  11/04/2011   Procedure: EXCISION MASS;  Surgeon: Wyn Forster., MD;  Location: [facility];  Service: Orthopedics;  Laterality: Right;  excisional biopsy right ulna mass   MASTECTOMY W/ SENTINEL NODE BIOPSY Right 08/30/2013   Procedure: RIGHT  AXILLARY SENTINEL LYMPH NODE BIOPSY; Right Axillary Node Disection;  Surgeon: Almond Lint, MD;  Location: [facility];  Service: General;  Laterality: Right;  Right side nuc med 7:00    PARTIAL KNEE ARTHROPLASTY Right 11/03/2015   Procedure: RIGHT KNEE MEDIAL UNICOMPARTMENTAL ARTHROPLASTY ;  Surgeon: Ollen Gross, MD;  Location: [facility];  Service: Orthopedics;  Laterality: Right;   RECTOCELE REPAIR     SIMPLE MASTECTOMY WITH AXILLARY SENTINEL NODE BIOPSY Left 08/30/2013   Procedure: Bilateral Breast Mastectomy ;  Surgeon: Almond Lint, MD;  Location: [facility];  Service: General;  Laterality: Left;   skin tags removed     breast, panty line, neckline   TOE SURGERY     preventative crossover toe surg/right foot   TOE SURGERY  2009   left foot/screw  in 2nd toe   TONSILLECTOMY     UPPER GASTROINTESTINAL ENDOSCOPY      Family History  Problem Relation Age of Onset   Stroke  Mother        died age 47   Diabetes Mother    Breast cancer Mother 36   Breast cancer Sister 36   Brain cancer Maternal Uncle 8   Breast cancer Paternal Aunt 41   Breast cancer Paternal Aunt        dx in her 72s   Cancer Maternal Grandmother        intra-abdominal cancer   Diabetes Maternal Grandfather    Breast cancer Paternal Grandmother 60   Brain cancer Cousin 59       maternal cousin   Brain cancer Cousin 20       paternal cousin   Colon cancer Neg Hx    Colon polyps Neg Hx    Crohn's disease Neg Hx    Esophageal cancer Neg Hx    Rectal cancer Neg Hx    Stomach cancer Neg Hx    Ulcerative colitis Neg Hx    Inflammatory bowel disease Neg Hx    Liver disease Neg Hx    Pancreatic cancer Neg Hx     Allergies  Allergen Reactions   Metformin And Related Diarrhea    Current Outpatient Medications on File Prior to Visit  Medication Sig Dispense Refill   Bacillus Coagulans-Inulin (PROBIOTIC) 1-250 BILLION-MG CAPS Take by mouth.     butalbital-acetaminophen-caffeine (FIORICET) 50-325-40 MG tablet Take 1 tablet by mouth as needed for headache. 30 tablet 2   diazepam (VALIUM) 5 MG tablet Take 5 mg by mouth every 6 (six) hours as needed for anxiety. As needed (flying & dental)     DULoxetine (CYMBALTA) 30 MG capsule Take 1 capsule (30 mg total) by mouth every evening. 90 capsule 0   Erenumab-aooe (AIMOVIG) 70 MG/ML SOAJ Inject 70 mg into the skin every 30 (thirty) days. 1.12  mL 3   esomeprazole (NEXIUM) 40 MG capsule Take 1 capsule (40 mg total) by mouth daily at 12 noon. (Patient taking differently: Take 40 mg by mouth every morning.) 365 capsule 0   famotidine (PEPCID) 40 MG tablet TAKE 1 TABLET BY MOUTH AT BEDTIME 90 tablet 0   glucose blood (EMBRACE PRO GLUCOSE TEST) test strip Use to check blood sugar TID 200 each 12   levothyroxine (SYNTHROID) 100 MCG tablet TAKE 1 TABLET BY MOUTH DAILY 365 tablet 0   nitrofurantoin, macrocrystal-monohydrate, (MACROBID) 100 MG capsule Take  1 capsule (100 mg total) by mouth at bedtime. 365 capsule 0   omega-3 acid ethyl esters (LOVAZA) 1 g capsule TAKE 1 CAPSULE BY MOUTH TWICE A DAY 180 capsule 3   Polyethylene Glycol 3350 (MIRALAX PO) Take by mouth. 1/2 capful every morning     pregabalin (LYRICA) 50 MG capsule Take 1 capsule (50 mg total) by mouth daily. 90 capsule 0   Rimegepant Sulfate (NURTEC) 75 MG TBDP Take 1 tablet as needed for acute migraine attacks 8 tablet 3   No current facility-administered medications on file prior to visit.    BP 120/82   Pulse 84   Temp 98.2 F (36.8 C) (Oral)   Ht 5' (1.524 m)   Wt 176 lb (79.8 kg)   SpO2 96%   BMI 34.37 kg/m       Objective:   Physical Exam Vitals and nursing note reviewed.  Constitutional:      Appearance: Normal appearance.  Cardiovascular:     Rate and Rhythm: Normal rate and regular rhythm.     Pulses: Normal pulses.     Heart sounds: Normal heart sounds.  Pulmonary:     Effort: Pulmonary effort is normal.     Breath sounds: Normal breath sounds.  Musculoskeletal:        General: Normal range of motion.  Skin:    General: Skin is warm and dry.  Neurological:     General: No focal deficit present.     Mental Status: She is alert and oriented to person, place, and time.  Psychiatric:        Mood and Affect: Mood normal.        Behavior: Behavior normal.        Thought Content: Thought content normal.        Judgment: Judgment normal.       Assessment & Plan:  1. Frequent urination  - POC Urinalysis Dipstick- negative  - Likely due to OAB.  She has been trialed on many medications did not help with her symptoms and she was advised to follow-up with you uro-GYN  2. Hx of migraine headaches -Will try increasing her Aimovig to 140 mg monthly.  Consider referral to headache clinic - Erenumab-aooe (AIMOVIG) 140 MG/ML SOAJ; Inject 140 mg into the skin every 30 (thirty) days.  Dispense: 1.12 mL; Refill: 3  3. Chronic fatigue -Will refer to  pulmonary for repeat sleep study. - Ambulatory referral to Pulmonology  Shirline Frees, NP  Time spent with patient today was 45 minutes which consisted of chart review, discussing  OAB, chronci fatigue, OSA, and migraine headache, work up, treatment answering questions and documentation.

## 2023-09-11 ENCOUNTER — Encounter: Payer: Self-pay | Admitting: Neurology

## 2023-09-14 ENCOUNTER — Ambulatory Visit: Payer: Medicare HMO | Admitting: Neurology

## 2023-09-23 ENCOUNTER — Encounter: Payer: Self-pay | Admitting: Gastroenterology

## 2023-09-26 ENCOUNTER — Other Ambulatory Visit: Payer: Self-pay | Admitting: Adult Health

## 2023-09-26 ENCOUNTER — Other Ambulatory Visit (HOSPITAL_COMMUNITY): Payer: Self-pay | Admitting: Psychiatry

## 2023-09-26 DIAGNOSIS — F3342 Major depressive disorder, recurrent, in full remission: Secondary | ICD-10-CM

## 2023-10-04 NOTE — Telephone Encounter (Signed)
Patient came in with her husband.  She reports that her headaches are improving with Aimovig.  She is taking Nurtec as needed and reports that this medication works quick if she does have a migraine headache.  She is wondering if she can have a few more samples to get her through until the end of the year   Samples of Nurtec were given to the patient, quantity 4 , Lot Number [id number]

## 2023-10-18 ENCOUNTER — Ambulatory Visit: Payer: Medicare HMO | Admitting: Adult Health

## 2023-10-22 ENCOUNTER — Other Ambulatory Visit: Payer: Self-pay | Admitting: Adult Health

## 2023-10-22 ENCOUNTER — Other Ambulatory Visit: Payer: Self-pay | Admitting: Physician Assistant

## 2023-10-22 DIAGNOSIS — Z8669 Personal history of other diseases of the nervous system and sense organs: Secondary | ICD-10-CM

## 2023-10-24 ENCOUNTER — Encounter: Payer: Self-pay | Admitting: Adult Health

## 2023-10-25 ENCOUNTER — Other Ambulatory Visit: Payer: Self-pay | Admitting: Adult Health

## 2023-10-25 DIAGNOSIS — Z8669 Personal history of other diseases of the nervous system and sense organs: Secondary | ICD-10-CM

## 2023-10-25 NOTE — Telephone Encounter (Signed)
Please advise 

## 2023-10-26 ENCOUNTER — Ambulatory Visit: Payer: Medicare HMO | Admitting: Adult Health

## 2023-10-27 ENCOUNTER — Other Ambulatory Visit: Payer: Self-pay

## 2023-10-27 DIAGNOSIS — Z8669 Personal history of other diseases of the nervous system and sense organs: Secondary | ICD-10-CM

## 2023-10-27 MED ORDER — AIMOVIG 140 MG/ML ~~LOC~~ SOAJ
140.0000 mg | SUBCUTANEOUS | 3 refills | Status: DC
Start: 2023-10-27 — End: 2023-11-29

## 2023-10-31 ENCOUNTER — Telehealth: Payer: Self-pay | Admitting: Adult Health

## 2023-10-31 NOTE — Telephone Encounter (Signed)
Dr Neale Burly not on epic please fax office notes, imaging, also pt was seen at  [facility] and [facility] fax to [facility] [phone]. Pt has an appt on 12-21-2023 with dr Neale Burly and pt is on cancellation list

## 2023-11-02 ENCOUNTER — Encounter (HOSPITAL_COMMUNITY): Payer: Self-pay | Admitting: Gastroenterology

## 2023-11-03 NOTE — Telephone Encounter (Signed)
Resent

## 2023-11-10 ENCOUNTER — Encounter (HOSPITAL_COMMUNITY): Payer: Self-pay | Admitting: Gastroenterology

## 2023-11-10 ENCOUNTER — Other Ambulatory Visit: Payer: Self-pay

## 2023-11-10 ENCOUNTER — Ambulatory Visit (HOSPITAL_COMMUNITY)
Admission: RE | Admit: 2023-11-10 | Discharge: 2023-11-10 | Disposition: A | Discharge status: Home / Self Care | Payer: Medicare HMO | Source: Ambulatory Visit | Attending: Gastroenterology | Admitting: Gastroenterology

## 2023-11-10 ENCOUNTER — Ambulatory Visit (HOSPITAL_COMMUNITY): Payer: Medicare HMO | Admitting: Anesthesiology

## 2023-11-10 ENCOUNTER — Encounter (HOSPITAL_COMMUNITY)
Admission: RE | Disposition: A | Discharge status: Home / Self Care | Payer: Self-pay | Source: Ambulatory Visit | Attending: Gastroenterology

## 2023-11-10 DIAGNOSIS — E039 Hypothyroidism, unspecified: Secondary | ICD-10-CM | POA: Diagnosis not present

## 2023-11-10 DIAGNOSIS — D127 Benign neoplasm of rectosigmoid junction: Secondary | ICD-10-CM | POA: Insufficient documentation

## 2023-11-10 DIAGNOSIS — K5730 Diverticulosis of large intestine without perforation or abscess without bleeding: Secondary | ICD-10-CM

## 2023-11-10 DIAGNOSIS — G4730 Sleep apnea, unspecified: Secondary | ICD-10-CM | POA: Insufficient documentation

## 2023-11-10 DIAGNOSIS — D120 Benign neoplasm of cecum: Secondary | ICD-10-CM | POA: Insufficient documentation

## 2023-11-10 DIAGNOSIS — D125 Benign neoplasm of sigmoid colon: Secondary | ICD-10-CM | POA: Insufficient documentation

## 2023-11-10 DIAGNOSIS — J45909 Unspecified asthma, uncomplicated: Secondary | ICD-10-CM | POA: Diagnosis not present

## 2023-11-10 DIAGNOSIS — Z87891 Personal history of nicotine dependence: Secondary | ICD-10-CM | POA: Diagnosis not present

## 2023-11-10 DIAGNOSIS — K635 Polyp of colon: Secondary | ICD-10-CM | POA: Diagnosis present

## 2023-11-10 DIAGNOSIS — K642 Third degree hemorrhoids: Secondary | ICD-10-CM | POA: Diagnosis not present

## 2023-11-10 DIAGNOSIS — D128 Benign neoplasm of rectum: Secondary | ICD-10-CM | POA: Diagnosis not present

## 2023-11-10 DIAGNOSIS — D122 Benign neoplasm of ascending colon: Secondary | ICD-10-CM | POA: Diagnosis not present

## 2023-11-10 DIAGNOSIS — K644 Residual hemorrhoidal skin tags: Secondary | ICD-10-CM | POA: Insufficient documentation

## 2023-11-10 HISTORY — PX: SUBMUCOSAL LIFTING INJECTION: SHX6855

## 2023-11-10 HISTORY — PX: HEMOSTASIS CLIP PLACEMENT: SHX6857

## 2023-11-10 HISTORY — PX: ENDOSCOPIC MUCOSAL RESECTION: SHX6839

## 2023-11-10 HISTORY — PX: POLYPECTOMY: SHX5525

## 2023-11-10 HISTORY — PX: COLONOSCOPY WITH PROPOFOL: SHX5780

## 2023-11-10 SURGERY — COLONOSCOPY WITH PROPOFOL
Anesthesia: Monitor Anesthesia Care

## 2023-11-10 MED ORDER — ACETAMINOPHEN 500 MG PO TABS
500.0000 mg | ORAL_TABLET | Freq: Once | ORAL | Status: AC
  Administered 2023-11-10: 500 mg via ORAL

## 2023-11-10 MED ORDER — PROPOFOL 1000 MG/100ML IV EMUL
INTRAVENOUS | Status: AC
Start: 2023-11-10 — End: ?
  Filled 2023-11-10: qty 100

## 2023-11-10 MED ORDER — SODIUM CHLORIDE 0.9 % IV SOLN: INTRAVENOUS | Status: DC

## 2023-11-10 MED ORDER — PROPOFOL 10 MG/ML IV BOLUS
INTRAVENOUS | Status: DC | PRN
  Administered 2023-11-10: 100 ug/kg/min via INTRAVENOUS

## 2023-11-10 MED ORDER — LIDOCAINE HCL (CARDIAC) PF 100 MG/5ML IV SOSY
PREFILLED_SYRINGE | INTRAVENOUS | Status: DC | PRN
  Administered 2023-11-10: 50 mg via INTRAVENOUS

## 2023-11-10 MED ORDER — ACETAMINOPHEN 500 MG PO TABS
ORAL_TABLET | ORAL | Status: AC
  Filled 2023-11-10: qty 1

## 2023-11-10 SURGICAL SUPPLY — 21 items
ELECT REM PT RETURN 9FT ADLT (ELECTROSURGICAL) IMPLANT
ELECTRODE REM PT RTRN 9FT ADLT (ELECTROSURGICAL) IMPLANT
FCP BXJMBJMB 240X2.8X (CUTTING FORCEPS)
FLOOR PAD 36X40 (MISCELLANEOUS) ×2 IMPLANT
FORCEPS BIOP RAD 4 LRG CAP 4 (CUTTING FORCEPS) IMPLANT
FORCEPS BIOP RJ4 240 W/NDL (CUTTING FORCEPS) IMPLANT
FORCEPS BXJMBJMB 240X2.8X (CUTTING FORCEPS) IMPLANT
INJECTOR/SNARE I SNARE (MISCELLANEOUS) IMPLANT
LUBRICANT JELLY 4.5OZ STERILE (MISCELLANEOUS) IMPLANT
MANIFOLD NEPTUNE II (INSTRUMENTS) IMPLANT
NDL SCLEROTHERAPY 25GX240 (NEEDLE) IMPLANT
NEEDLE SCLEROTHERAPY 25GX240 (NEEDLE) IMPLANT
PAD FLOOR 36X40 (MISCELLANEOUS) ×2 IMPLANT
PROBE APC STR FIRE (PROBE) IMPLANT
PROBE INJECTION GOLD 7FR (MISCELLANEOUS) IMPLANT
SNARE ROTATE MED OVAL 20MM (MISCELLANEOUS) IMPLANT
SYR 50ML LL SCALE MARK (SYRINGE) IMPLANT
TRAP SPECIMEN MUCOUS 40CC (MISCELLANEOUS) IMPLANT
TUBING ENDO SMARTCAP PENTAX (MISCELLANEOUS) IMPLANT
TUBING IRRIGATION ENDOGATOR (MISCELLANEOUS) ×2 IMPLANT
WATER STERILE IRR 1000ML POUR (IV SOLUTION) IMPLANT

## 2023-11-10 NOTE — Op Note (Signed)
[facility] Patient Name: Jean Nash Procedure Date: 11/10/2023 MRN: [id number] Attending MD: Corliss Parish , MD, [id number] Date of Birth: 14-Oct-1941 CSN: [id number] Age: 82 Admit Type: Outpatient Procedure:                Colonoscopy Indications:              Excision of colonic polyp Providers:                Corliss Parish, MD, Norman Clay, RN, Alan Ripper,                            Technician Referring MD:             Venita Lick. Russella Dar, MD, Napoleon Form, MD Medicines:                Monitored Anesthesia Care Complications:            No immediate complications. Estimated Blood Loss:     Estimated blood loss was minimal. Procedure:                Pre-Anesthesia Assessment:                           - Prior to the procedure, a History and Physical                            was performed, and patient medications and                            allergies were reviewed. The patient's tolerance of                            previous anesthesia was also reviewed. The risks                            and benefits of the procedure and the sedation                            options and risks were discussed with the patient.                            All questions were answered, and informed consent                            was obtained. Prior Anticoagulants: The patient has                            taken no anticoagulant or antiplatelet agents                            except for NSAID medication. ASA Grade Assessment:                            III - A patient with severe systemic disease. After  reviewing the risks and benefits, the patient was                            deemed in satisfactory condition to undergo the                            procedure.                           After obtaining informed consent, the colonoscope                            was passed under direct vision. Throughout the                             procedure, the patient's blood pressure, pulse, and                            oxygen saturations were monitored continuously. The                            [id number]-HQ190L ([id number] Olympus colonoscope was                            introduced through the anus and advanced to the 3                            cm into the ileum. The colonoscopy was performed                            without difficulty. The patient tolerated the                            procedure. The quality of the bowel preparation was                            adequate. The terminal ileum, ileocecal valve,                            appendiceal orifice, and rectum were photographed. Scope In: 8:10:30 AM Scope Out: 9:00:16 AM Scope Withdrawal Time: 0 hours 44 minutes 13 seconds  Total Procedure Duration: 0 hours 49 minutes 46 seconds  Findings:      The digital rectal exam findings include hemorrhoids. Pertinent       negatives include no palpable rectal lesions.      The terminal ileum and ileocecal valve appeared normal.      A 30 mm polyp was found in the ascending colon. The polyp was sessile.       Unfortunately tattoo was found underneath it. Preparations were made for       mucosal resection. Demarcation of the lesion was performed with       high-definition white light and narrow band imaging to clearly identify       the boundaries of the lesion. Endoclot was injected to raise the lesion.       Piecemeal mucosal  resection using a snare was performed. Resection and       retrieval were complete. Resected tissue margins were examined and clear       of polyp tissue. Coagulation for tissue destruction of the margin using       snare tip soft coagulation was successful. To prevent bleeding after       mucosal resection, four hemostatic clips were successfully placed (MR       conditional). Clip manufacturer: [employer]. There was no       bleeding at the end of the procedure.      Six sessile polyps  were found in the rectum (1), recto-sigmoid colon       (1), sigmoid colon (1), ascending colon (2) and cecum (1). The polyps       were 3 to 10 mm in size. These polyps were removed with a cold snare.       Resection and retrieval were complete.      Many medium-mouthed and small-mouthed diverticula were found in the       entire colon.      Normal mucosa was found in the entire colon otherwise.      Non-bleeding non-thrombosed external and internal hemorrhoids were found       during retroflexion, during perianal exam and during digital exam. The       hemorrhoids were Grade III (internal hemorrhoids that prolapse but       require manual reduction). Impression:               - Hemorrhoids found on digital rectal exam.                           - The examined portion of the ileum was normal.                           - One 30 mm polyp in the ascending colon (with                            tattoo underneath), removed with piecemeal mucosal                            resection. Resected and retrieved. Treated with                            STSC to the margin. Clips (MR conditional) were                            placed. Clip manufacturer: [employer].                           - Six, 3 to 10 mm polyps in the rectum, at the                            recto-sigmoid colon, in the sigmoid colon, in the                            ascending colon and in the cecum, removed with a  cold snare. Resected and retrieved.                           - Diverticulosis in the entire examined colon.                           - Normal mucosa in the entire examined colon                            otherwise.                           - Non-bleeding non-thrombosed external and internal                            hemorrhoids. Moderate Sedation:      Not Applicable - Patient had care per Anesthesia. Recommendation:           - The patient will be observed post-procedure,                             until all discharge criteria are met.                           - Discharge patient to home.                           - Patient has a contact number available for                            emergencies. The signs and symptoms of potential                            delayed complications were discussed with the                            patient. Return to normal activities tomorrow.                            Written discharge instructions were provided to the                            patient.                           - High fiber diet.                           - Use FiberCon 1-2 tablets PO daily.                           - Continue present medications.                           - Await pathology results.                           -  Repeat colonoscopy in 6 to 9 months for                            surveillance after piecemeal polypectomy (unless                            high-grade dysplasia is noted).                           - The findings and recommendations were discussed                            with the patient.                           - The findings and recommendations were discussed                            with the patient's family. Procedure Code(s):        --- Professional ---                           [phone], Colonoscopy, flexible; with endoscopic                            mucosal resection                           [phone], 59, Colonoscopy, flexible; with removal of                            tumor(s), polyp(s), or other lesion(s) by snare                            technique Diagnosis Code(s):        --- Professional ---                           K64.2, Third degree hemorrhoids                           D12.2, Benign neoplasm of ascending colon                           D12.8, Benign neoplasm of rectum                           D12.7, Benign neoplasm of rectosigmoid junction                           D12.5, Benign neoplasm of sigmoid  colon                           D12.0, Benign neoplasm of cecum                           K63.5, Polyp of colon  K57.30, Diverticulosis of large intestine without                            perforation or abscess without bleeding CPT copyright 2022 American Medical Association. All rights reserved. The codes documented in this report are preliminary and upon coder review may  be revised to meet current compliance requirements. Corliss Parish, MD 11/10/2023 9:15:29 AM Number of Addenda: 0

## 2023-11-10 NOTE — Anesthesia Postprocedure Evaluation (Signed)
Anesthesia Post Note  Patient: Jametria Nungester  Procedure(s) Performed: COLONOSCOPY WITH PROPOFOL ENDOSCOPIC MUCOSAL RESECTION POLYPECTOMY SUBMUCOSAL LIFTING INJECTION HEMOSTASIS CLIP PLACEMENT     Patient location during evaluation: PACU Anesthesia Type: MAC Level of consciousness: awake and alert Pain management: pain level controlled Vital Signs Assessment: post-procedure vital signs reviewed and stable Respiratory status: spontaneous breathing, nonlabored ventilation, respiratory function stable and patient connected to nasal cannula oxygen Cardiovascular status: stable and blood pressure returned to baseline Postop Assessment: no apparent nausea or vomiting Anesthetic complications: no   No notable events documented.  Last Vitals:  Vitals:   11/10/23 0920 11/10/23 0930  BP: 117/83 (!) 148/90  Pulse: 71 67  Resp: 19 20  Temp:    SpO2: 98% 100%    Last Pain:  Vitals:   11/10/23 0930  TempSrc:   PainSc: 10-Worst pain ever                 Earl Lites P Gerrit Rafalski

## 2023-11-10 NOTE — Anesthesia Preprocedure Evaluation (Signed)
Anesthesia Evaluation  Patient identified by MRN, date of birth, ID band Patient awake    Reviewed: Allergy & Precautions, NPO status , Patient's Chart, lab work & pertinent test results  Airway Mallampati: II  TM Distance: >3 FB Neck ROM: Full    Dental no notable dental hx.    Pulmonary asthma , sleep apnea , former smoker   Pulmonary exam normal        Cardiovascular negative cardio ROS  Rhythm:Regular Rate:Normal     Neuro/Psych  Headaches  Anxiety Depression       GI/Hepatic Neg liver ROS, hiatal hernia,GERD  ,,  Endo/Other  Hypothyroidism    Renal/GU   negative genitourinary   Musculoskeletal  (+) Arthritis ,  Fibromyalgia -  Abdominal Normal abdominal exam  (+)   Peds  Hematology negative hematology ROS (+)   Anesthesia Other Findings   Reproductive/Obstetrics                             Anesthesia Physical Anesthesia Plan  ASA: 2  Anesthesia Plan: MAC   Post-op Pain Management:    Induction:   PONV Risk Score and Plan: 2 and Propofol infusion and Treatment may vary due to age or medical condition  Airway Management Planned: Simple Face Mask and Nasal Cannula  Additional Equipment: None  Intra-op Plan:   Post-operative Plan:   Informed Consent: I have reviewed the patients History and Physical, chart, labs and discussed the procedure including the risks, benefits and alternatives for the proposed anesthesia with the patient or authorized representative who has indicated his/her understanding and acceptance.     Dental advisory given  Plan Discussed with: CRNA  Anesthesia Plan Comments:        Anesthesia Quick Evaluation

## 2023-11-10 NOTE — Transfer of Care (Signed)
Immediate Anesthesia Transfer of Care Note  Patient: Jean Nash  Procedure(s) Performed: COLONOSCOPY WITH PROPOFOL ENDOSCOPIC MUCOSAL RESECTION POLYPECTOMY SUBMUCOSAL LIFTING INJECTION HEMOSTASIS CLIP PLACEMENT  Patient Location: PACU  Anesthesia Type:MAC  Level of Consciousness: awake and alert   Airway & Oxygen Therapy: Patient Spontanous Breathing and Patient connected to nasal cannula oxygen  Post-op Assessment: Report given to RN  Post vital signs: Reviewed and stable  Last Vitals:  Vitals Value Taken Time  BP 111/63 11/10/23 0912  Temp 36.2 C 11/10/23 0911  Pulse 74 11/10/23 0913  Resp 8 11/10/23 0913  SpO2 98 % 11/10/23 0913  Vitals shown include unfiled device data.  Last Pain:  Vitals:   11/10/23 0911  TempSrc: Temporal  PainSc: 0-No pain         Complications: No notable events documented.

## 2023-11-10 NOTE — Discharge Instructions (Signed)

## 2023-11-10 NOTE — H&P (Signed)
GASTROENTEROLOGY PROCEDURE H&P NOTE   Primary Care Physician: Shirline Frees, NP  HPI: Jean Nash is a 82 y.o. female who presents for Colonoscopy for attempt at resection of [facility] colon polyp.  Past Medical History:  Diagnosis Date   Adenomatous colon polyp    Anxiety    PHOBIAS   Arthritis    Breast cancer (HCC) 08/08/2013   right LOQ   Cataract    Chronic insomnia    Cluster headaches    history of migraines / NONE FOR SEVERAL YRS   Depression    Diverticulosis    Fatty liver 2011   Fibromyalgia    GERD (gastroesophageal reflux disease)    H/O hiatal hernia    History of transfusion 08/30/2013   Hx of radiation therapy 10/29/13- 12/14/13   right chest wall 5040 cGy 28 sessions, right supraclavicular/axillary region 5040 cGy 28 sessions, right chest wall boost 1000 cGy 5 sessions   Hypothyroidism    Internal hemorrhoids    Irritable bowel syndrome    Kidney stone    Lymphedema    RT ARM - WEARS SLEEVE   Macular degeneration    hole/right eye   MDD (major depressive disorder)    Osteopenia    Osteopenia    Other abnormal glucose    Other and unspecified hyperlipidemia    Pain in joint, shoulder region    Pneumonia 5284,1324   Sleep apnea    USES C-PAP   Stress incontinence, female    Past Surgical History:  Procedure Laterality Date   ABDOMINAL HYSTERECTOMY     APPENDECTOMY     BILATERAL TOTAL MASTECTOMY WITH AXILLARY LYMPH NODE DISSECTION  08/30/2013   Dr Donell Beers   BREAST CYST ASPIRATION     9 cysts   CATARACT EXTRACTION, BILATERAL  2005/2007   CHOLECYSTECTOMY     COLONOSCOPY     CYSTOCELE REPAIR     EVACUATION BREAST HEMATOMA Left 08/31/2013   Procedure: EVACUATION HEMATOMA BREAST;  Surgeon: Almond Lint, MD;  Location: [facility];  Service: General;  Laterality: Left;   EYE SURGERY     to repair macular hole   FOOT ARTHROPLASTY     lt    GANGLION CYST EXCISION     rt foot   HEMORRHOID SURGERY     03/1993   JOINT REPLACEMENT  03/15/11   left knee  replacement   KNEE ARTHROSCOPY     /partial knee 2016/left knee 2012   MASS EXCISION  11/04/2011   Procedure: EXCISION MASS;  Surgeon: Wyn Forster., MD;  Location: [facility];  Service: Orthopedics;  Laterality: Right;  excisional biopsy right ulna mass   MASTECTOMY W/ SENTINEL NODE BIOPSY Right 08/30/2013   Procedure: RIGHT  AXILLARY SENTINEL LYMPH NODE BIOPSY; Right Axillary Node Disection;  Surgeon: Almond Lint, MD;  Location: [facility];  Service: General;  Laterality: Right;  Right side nuc med 7:00    PARTIAL KNEE ARTHROPLASTY Right 11/03/2015   Procedure: RIGHT KNEE MEDIAL UNICOMPARTMENTAL ARTHROPLASTY ;  Surgeon: Ollen Gross, MD;  Location: [facility];  Service: Orthopedics;  Laterality: Right;   RECTOCELE REPAIR     SIMPLE MASTECTOMY WITH AXILLARY SENTINEL NODE BIOPSY Left 08/30/2013   Procedure: Bilateral Breast Mastectomy ;  Surgeon: Almond Lint, MD;  Location: [facility];  Service: General;  Laterality: Left;   skin tags removed     breast, panty line, neckline   TOE SURGERY     preventative crossover toe surg/right foot   TOE  SURGERY  2009   left foot/screw  in 2nd toe   TONSILLECTOMY     UPPER GASTROINTESTINAL ENDOSCOPY     No current facility-administered medications for this encounter.   No current facility-administered medications for this encounter. No Known Allergies Family History  Problem Relation Age of Onset   Stroke Mother        died age 26   Diabetes Mother    Breast cancer Mother 70   Breast cancer Sister 22   Brain cancer Maternal Uncle 8   Breast cancer Paternal Aunt 56   Breast cancer Paternal Aunt        dx in her 22s   Cancer Maternal Grandmother        intra-abdominal cancer   Diabetes Maternal Grandfather    Breast cancer Paternal Grandmother 87   Brain cancer Cousin 33       maternal cousin   Brain cancer Cousin 20       paternal cousin   Colon cancer Neg Hx    Colon polyps Neg Hx    Crohn's disease Neg Hx    Esophageal  cancer Neg Hx    Rectal cancer Neg Hx    Stomach cancer Neg Hx    Ulcerative colitis Neg Hx    Inflammatory bowel disease Neg Hx    Liver disease Neg Hx    Pancreatic cancer Neg Hx    Social History   Socioeconomic History   Marital status: Married    Spouse name: [name]   Number of children: 3   Years of education: Not on file   Highest education level: GED or equivalent  Occupational History   Occupation: retired Catering manager  Tobacco Use   Smoking status: Former    Current packs/day: 0.00    Average packs/day: 0.1 packs/day for 2.0 years (0.2 ttl pk-yrs)    Types: Cigarettes    Start date: 11/16/1959    Quit date: 11/15/1960    Years since quitting: 63.0    Passive exposure: Past (over 58 years ago)   Smokeless tobacco: Never  Vaping Use   Vaping status: Never Used  Substance and Sexual Activity   Alcohol use: Yes    Comment: rarely   Drug use: No   Sexual activity: Yes    Comment: menarche age 61, fist live birth 35, P 3, hysterectomy age 89, no HRT, BCP 2 yrs  Other Topics Concern   Not on file  Social History Narrative   Occupation:  Retired Catering manager    Married with 3 grown children      Never Smoked     Alcohol use-no        Social Drivers of Corporate investment banker Strain: Low Risk  (02/09/2023)   Overall Financial Resource Strain (CARDIA)    Difficulty of Paying Living Expenses: Not hard at all  Food Insecurity: No Food Insecurity (02/09/2023)   Hunger Vital Sign    Worried About Running Out of Food in the Last Year: Never true    Ran Out of Food in the Last Year: Never true  Transportation Needs: No Transportation Needs (02/09/2023)   PRAPARE - Administrator, Civil Service (Medical): No    Lack of Transportation (Non-Medical): No  Physical Activity: Inactive (02/09/2023)   Exercise Vital Sign    Days of Exercise per Week: 0 days    Minutes of Exercise per Session: 0 min  Stress: No Stress Concern Present (02/09/2023)   [employer] of  Occupational Health - Occupational Stress Questionnaire    Feeling of Stress : Not at all  Social Connections: Socially Integrated (02/09/2023)   Social Connection and Isolation Panel [NHANES]    Frequency of Communication with Friends and Family: More than three times a week    Frequency of Social Gatherings with Friends and Family: More than three times a week    Attends Religious Services: More than 4 times per year    Active Member of [employer] or Organizations: Yes    Attends Banker Meetings: More than 4 times per year    Marital Status: Married  Catering manager Violence: Not At Risk (02/09/2023)   Humiliation, Afraid, Rape, and Kick questionnaire    Fear of Current or Ex-Partner: No    Emotionally Abused: No    Physically Abused: No    Sexually Abused: No    Physical Exam: Today's Vitals   11/10/23 0700  BP: (!) 143/67  Pulse: 85  Resp: (!) 21  Temp: 97.7 F (36.5 C)  TempSrc: Temporal  SpO2: 94%  Weight: 80.3 kg  Height: 5' (1.524 m)  PainSc: 0-No pain   Body mass index is 34.57 kg/m. GEN: NAD EYE: Sclerae anicteric ENT: MMM CV: Non-tachycardic GI: Soft, NT/ND NEURO:  Alert & Oriented x 3  Lab Results: No results for input(s): "WBC", "HGB", "HCT", "PLT" in the last 72 hours. BMET No results for input(s): "NA", "K", "CL", "CO2", "GLUCOSE", "BUN", "CREATININE", "CALCIUM" in the last 72 hours. LFT No results for input(s): "PROT", "ALBUMIN", "AST", "ALT", "ALKPHOS", "BILITOT", "BILIDIR", "IBILI" in the last 72 hours. PT/INR No results for input(s): "LABPROT", "INR" in the last 72 hours.   Impression / Plan: This is a 82 y.o.female  who presents for Colonoscopy for attempt at resection of [facility] colon polyp.  The risks and benefits of endoscopic evaluation/treatment were discussed with the patient and/or family; these include but are not limited to the risk of perforation, infection, bleeding, missed lesions, lack of diagnosis, severe illness requiring  hospitalization, as well as anesthesia and sedation related illnesses.  The patient's history has been reviewed, patient examined, no change in status, and deemed stable for procedure.  The patient and/or family is agreeable to proceed.    Corliss Parish, MD [facility] Advanced Endoscopy Office # [phone]

## 2023-11-11 LAB — SURGICAL PATHOLOGY

## 2023-11-13 ENCOUNTER — Encounter (HOSPITAL_COMMUNITY): Payer: Self-pay | Admitting: Gastroenterology

## 2023-11-14 ENCOUNTER — Encounter: Payer: Self-pay | Admitting: Gastroenterology

## 2023-11-16 ENCOUNTER — Encounter: Payer: Self-pay | Admitting: Adult Health

## 2023-11-16 DIAGNOSIS — Z760 Encounter for issue of repeat prescription: Secondary | ICD-10-CM

## 2023-11-21 ENCOUNTER — Encounter: Payer: Self-pay | Admitting: Adult Health

## 2023-11-24 ENCOUNTER — Encounter: Payer: Self-pay | Admitting: Adult Health

## 2023-11-25 NOTE — Telephone Encounter (Signed)
Can someone please assist with this 

## 2023-11-29 ENCOUNTER — Other Ambulatory Visit: Payer: Self-pay

## 2023-11-29 ENCOUNTER — Other Ambulatory Visit (HOSPITAL_COMMUNITY): Payer: Self-pay

## 2023-11-29 DIAGNOSIS — Z8669 Personal history of other diseases of the nervous system and sense organs: Secondary | ICD-10-CM

## 2023-11-29 DIAGNOSIS — G43909 Migraine, unspecified, not intractable, without status migrainosus: Secondary | ICD-10-CM | POA: Insufficient documentation

## 2023-11-29 MED ORDER — AIMOVIG 140 MG/ML ~~LOC~~ SOAJ: 140.0000 mg | SUBCUTANEOUS | 3 refills | Status: DC

## 2023-12-02 NOTE — Telephone Encounter (Signed)
Spoke to pt and advised that Rx message was sent to pharmacy PA team. Also informed pt that I tried to start the PA myself but was unsuccessful. Message below from Cover My meds. Pt verbalized understanding.

## 2023-12-02 NOTE — Telephone Encounter (Signed)
Hello, I sent a message a week ago requesting if someone can help. Can someone assist me with this please.

## 2023-12-05 ENCOUNTER — Other Ambulatory Visit (HOSPITAL_COMMUNITY): Payer: Self-pay

## 2023-12-05 ENCOUNTER — Telehealth: Payer: Self-pay

## 2023-12-05 NOTE — Telephone Encounter (Signed)
Pharmacy Patient Advocate Encounter   Received notification from Pt Calls Messages that prior authorization for Aimovig 140MG /ML auto-injectors is required/requested.   Insurance verification completed.   The patient is insured through [facility] .   Per test claim: PA required; PA submitted to above mentioned insurance via CoverMyMeds Key/confirmation #/EOC [id number] Status is pending

## 2023-12-07 ENCOUNTER — Other Ambulatory Visit (HOSPITAL_COMMUNITY): Payer: Self-pay

## 2023-12-07 NOTE — Telephone Encounter (Signed)
Pharmacy Patient Advocate Encounter  Received notification from [employer] that Prior Authorization for Aimovig 140MG /ML auto-injectors  has been APPROVED from 12/05/23 to 03/04/24. Unable to obtain price due to refill too soon rejection, last fill date 12/05/23 next available fill date02/12/25   PA #/Case ID/Reference #:  [id number]

## 2023-12-14 ENCOUNTER — Encounter: Payer: Self-pay | Admitting: Adult Health

## 2023-12-14 ENCOUNTER — Other Ambulatory Visit: Payer: Self-pay | Admitting: Adult Health

## 2023-12-14 MED ORDER — NURTEC 75 MG PO TBDP: ORAL_TABLET | ORAL | 3 refills | Status: DC

## 2023-12-22 ENCOUNTER — Ambulatory Visit: Payer: Medicare HMO | Admitting: Adult Health

## 2023-12-23 ENCOUNTER — Ambulatory Visit (INDEPENDENT_AMBULATORY_CARE_PROVIDER_SITE_OTHER): Payer: Medicare HMO

## 2023-12-23 ENCOUNTER — Ambulatory Visit (INDEPENDENT_AMBULATORY_CARE_PROVIDER_SITE_OTHER): Payer: Medicare HMO | Admitting: Adult Health

## 2023-12-23 ENCOUNTER — Encounter: Payer: Self-pay | Admitting: Adult Health

## 2023-12-23 VITALS — BP 120/60 | HR 81 | Temp 97.7°F | Ht 60.0 in | Wt 177.0 lb

## 2023-12-23 DIAGNOSIS — M79645 Pain in left finger(s): Secondary | ICD-10-CM | POA: Diagnosis not present

## 2023-12-23 NOTE — Progress Notes (Signed)
 Subjective:    [name], female    DOB: [birth year], 83 y.o.   MRN: [id number]  HPI  83 year old female who  has a past medical history of Adenomatous colon polyp, Anxiety, Arthritis, Breast cancer (HCC) (08/08/2013), Cataract, Chronic insomnia, Cluster headaches, Depression, Diverticulosis, Fatty liver (2011), Fibromyalgia, GERD (gastroesophageal reflux disease), H/O hiatal hernia, History of transfusion (08/30/2013), radiation therapy (10/29/13- 12/14/13), Hypothyroidism, Internal hemorrhoids, Irritable bowel syndrome, Kidney stone, Lymphedema, Macular degeneration, MDD (major depressive disorder), Osteopenia, Osteopenia, Other abnormal glucose, Other and unspecified hyperlipidemia, Pain in joint, shoulder region, Pneumonia (8032,7990), Sleep apnea, and Stress incontinence, female.   He was originally coming in today for the complaint of diarrhea that she had for 4 to 5 days.  Thankfully this has resolved and she is feeling a lot better.  Her only other complaint today is that of pain at the distal tip of her left middle finger.  She reports it feels like something is in it.  Pain is not all the time and does not have a rhyme or reason but when it happens it is very sharp and painful for her.  She denies any trauma or aggravating injury.   Review of Systems See HPI   Past Medical History:  Diagnosis Date   Adenomatous colon polyp    Anxiety    PHOBIAS   Arthritis    Breast cancer (HCC) 08/08/2013   right LOQ   Cataract    Chronic insomnia    Cluster headaches    history of migraines / NONE FOR SEVERAL YRS   Depression    Diverticulosis    Fatty liver 2011   Fibromyalgia    GERD (gastroesophageal reflux disease)    H/O hiatal hernia    History of transfusion 08/30/2013   Hx of radiation therapy 10/29/13- 12/14/13   right chest wall 5040 cGy 28 sessions, right supraclavicular/axillary region 5040 cGy 28 sessions, right chest wall boost 1000 cGy 5 sessions    Hypothyroidism    Internal hemorrhoids    Irritable bowel syndrome    Kidney stone    Lymphedema    RT ARM - WEARS SLEEVE   Macular degeneration    hole/right eye   MDD (major depressive disorder)    Osteopenia    Osteopenia    Other abnormal glucose    Other and unspecified hyperlipidemia    Pain in joint, shoulder region    Pneumonia 8032,7990   Sleep apnea    USES C-PAP   Stress incontinence, female     Social History   Socioeconomic History   Marital status: Married    Spouse name: Ubaldo   Number of children: 3   Years of education: Not on file   Highest education level: GED or equivalent  Occupational History   Occupation: retired catering manager  Tobacco Use   Smoking status: Former    Current packs/day: 0.00    Average packs/day: 0.1 packs/day for 2.0 years (0.2 ttl pk-yrs)    Types: Cigarettes    Start date: 11/16/1959    Quit date: 11/15/1960    Years since quitting: 63.1    Passive exposure: Past (over 58 years ago)   Smokeless tobacco: Never  Vaping Use   Vaping status: Never Used  Substance and Sexual Activity   Alcohol use: Yes    Comment: rarely   Drug use: No   Sexual activity: Yes    Comment: menarche age 17, fist live birth 17, P 3, hysterectomy age  83, no HRT, BCP 2 yrs  Other Topics Concern   Not on file  Social History Narrative   Occupation:  Retired catering manager    Married with 3 grown children      Never Smoked     Alcohol use-no        Social Drivers of Corporate Investment Banker Strain: Low Risk  (12/22/2023)   Overall Financial Resource Strain (CARDIA)    Difficulty of Paying Living Expenses: Not hard at all  Food Insecurity: No Food Insecurity (12/22/2023)   Hunger Vital Sign    Worried About Running Out of Food in the Last Year: Never true    Ran Out of Food in the Last Year: Never true  Transportation Needs: No Transportation Needs (02/09/2023)   PRAPARE - Administrator, Civil Service (Medical): No    Lack of Transportation  (Non-Medical): No  Physical Activity: Inactive (12/22/2023)   Exercise Vital Sign    Days of Exercise per Week: 0 days    Minutes of Exercise per Session: 0 min  Stress: Stress Concern Present (12/22/2023)   [employer] of Occupational Health - Occupational Stress Questionnaire    Feeling of Stress : To some extent  Social Connections: Unknown (12/22/2023)   Social Connection and Isolation Panel [NHANES]    Frequency of Communication with Friends and Family: Patient declined    Frequency of Social Gatherings with Friends and Family: Patient declined    Attends Religious Services: Patient declined    Active Member of Clubs or Organizations: No    Attends Engineer, Structural: More than 4 times per year    Marital Status: Married  Catering Manager Violence: Not At Risk (02/09/2023)   Humiliation, Afraid, Rape, and Kick questionnaire    Fear of Current or Ex-Partner: No    Emotionally Abused: No    Physically Abused: No    Sexually Abused: No    Past Surgical History:  Procedure Laterality Date   ABDOMINAL HYSTERECTOMY     APPENDECTOMY     BILATERAL TOTAL MASTECTOMY WITH AXILLARY LYMPH NODE DISSECTION  08/30/2013   Dr ARON   BREAST CYST ASPIRATION     9 cysts   CATARACT EXTRACTION, BILATERAL  2005/2007   CHOLECYSTECTOMY     COLONOSCOPY     COLONOSCOPY WITH PROPOFOL  N/A 11/10/2023   Procedure: COLONOSCOPY WITH PROPOFOL ;  Surgeon: Wilhelmenia Aloha Raddle., MD;  Location: [facility];  Service: Gastroenterology;  Laterality: N/A;   CYSTOCELE REPAIR     ENDOSCOPIC MUCOSAL RESECTION N/A 11/10/2023   Procedure: ENDOSCOPIC MUCOSAL RESECTION;  Surgeon: Wilhelmenia Aloha Raddle., MD;  Location: [facility];  Service: Gastroenterology;  Laterality: N/A;   EVACUATION BREAST HEMATOMA Left 08/31/2013   Procedure: EVACUATION HEMATOMA BREAST;  Surgeon: Jina Aron, MD;  Location: [facility];  Service: General;  Laterality: Left;   EYE SURGERY     to repair macular hole   FOOT  ARTHROPLASTY     lt    GANGLION CYST EXCISION     rt foot   HEMORRHOID SURGERY     03/1993   HEMOSTASIS CLIP PLACEMENT  11/10/2023   Procedure: HEMOSTASIS CLIP PLACEMENT;  Surgeon: Wilhelmenia Aloha Raddle., MD;  [facility]: [facility];  Service: Gastroenterology;;   JOINT REPLACEMENT  03/15/11   left knee replacement   KNEE ARTHROSCOPY     /partial knee 2016/left knee 2012   MASS EXCISION  11/04/2011   Procedure: EXCISION MASS;  Surgeon: Lamar LULLA Leonor Raddle., MD;  Location:  [facility];  Service: Orthopedics;  Laterality: Right;  excisional biopsy right ulna mass   MASTECTOMY W/ SENTINEL NODE BIOPSY Right 08/30/2013   Procedure: RIGHT  AXILLARY SENTINEL LYMPH NODE BIOPSY; Right Axillary Node Disection;  Surgeon: Jina Nephew, MD;  Location: [facility];  Service: General;  Laterality: Right;  Right side nuc med 7:00    PARTIAL KNEE ARTHROPLASTY Right 11/03/2015   Procedure: RIGHT KNEE MEDIAL UNICOMPARTMENTAL ARTHROPLASTY ;  Surgeon: Dempsey Moan, MD;  Location: [facility];  Service: Orthopedics;  Laterality: Right;   POLYPECTOMY  11/10/2023   Procedure: POLYPECTOMY;  Surgeon: Mansouraty, Aloha Raddle., MD;  Location: [facility];  Service: Gastroenterology;;   RECTOCELE REPAIR     SIMPLE MASTECTOMY WITH AXILLARY SENTINEL NODE BIOPSY Left 08/30/2013   Procedure: Bilateral Breast Mastectomy ;  Surgeon: Jina Nephew, MD;  Location: [facility];  Service: General;  Laterality: Left;   skin tags removed     breast, panty line, neckline   SUBMUCOSAL LIFTING INJECTION  11/10/2023   Procedure: SUBMUCOSAL LIFTING INJECTION;  Surgeon: Wilhelmenia Aloha Raddle., MD;  [facility]: [facility];  Service: Gastroenterology;;   TOE SURGERY     preventative crossover toe surg/right foot   TOE SURGERY  2009   left foot/screw  in 2nd toe   TONSILLECTOMY     UPPER GASTROINTESTINAL ENDOSCOPY      Family History  Problem Relation Age of Onset   Stroke Mother        died age 13   Diabetes Mother    Breast  cancer Mother 46   Breast cancer Sister 63   Brain cancer Maternal Uncle 8   Breast cancer Paternal Aunt 49   Breast cancer Paternal Aunt        dx in her 42s   Cancer Maternal Grandmother        intra-abdominal cancer   Diabetes Maternal Grandfather    Breast cancer Paternal Grandmother 5   Brain cancer Cousin 28       maternal cousin   Brain cancer Cousin 20       paternal cousin   Colon cancer Neg Hx    Colon polyps Neg Hx    Crohn's disease Neg Hx    Esophageal cancer Neg Hx    Rectal cancer Neg Hx    Stomach cancer Neg Hx    Ulcerative colitis Neg Hx    Inflammatory bowel disease Neg Hx    Liver disease Neg Hx    Pancreatic cancer Neg Hx     No Known Allergies  Current Outpatient Medications on File Prior to Visit  Medication Sig Dispense Refill   Bacillus Coagulans-Inulin (PROBIOTIC) 1-250 BILLION-MG CAPS Take by mouth.     butalbital -acetaminophen -caffeine  (FIORICET) 50-325-40 MG tablet Take 1 tablet by mouth as needed for headache. 30 tablet 2   diazepam  (VALIUM ) 5 MG tablet Take 5 mg by mouth every 6 (six) hours as needed for anxiety. As needed (flying & dental)     DULoxetine  (CYMBALTA ) 30 MG capsule TAKE ONE CAPSULE BY MOUTH EVERY EVENING 90 capsule 0   Erenumab -aooe (AIMOVIG ) 140 MG/ML SOAJ Inject 140 mg into the skin every 30 (thirty) days. 1.12 mL 3   esomeprazole  (NEXIUM ) 40 MG capsule Take 1 capsule (40 mg total) by mouth daily at 12 noon. (Patient taking differently: Take 40 mg by mouth every morning.) 365 capsule 0   famotidine  (PEPCID ) 40 MG tablet TAKE 1 TABLET BY MOUTH AT BEDTIME 90 tablet 0   glucose blood (  EMBRACE PRO GLUCOSE TEST) test strip Use to check blood sugar TID 200 each 12   levothyroxine  (SYNTHROID ) 100 MCG tablet TAKE 1 TABLET BY MOUTH DAILY 365 tablet 0   nitrofurantoin , macrocrystal-monohydrate, (MACROBID ) 100 MG capsule TAKE 1 CAPSULE BY MOUTH AT BEDTIME 365 capsule 0   omega-3 acid ethyl esters (LOVAZA ) 1 g capsule TAKE 1 CAPSULE BY  MOUTH TWICE A DAY 180 capsule 3   Polyethylene Glycol 3350  (MIRALAX  PO) Take by mouth. 1/2 capful every morning     Rimegepant Sulfate (NURTEC) 75 MG TBDP Take 1 tablet as needed for acute migraine attacks 8 tablet 3   No current facility-administered medications on file prior to visit.    BP 120/60   Pulse 81   Temp 97.7 F (36.5 C) (Oral)   Ht 5' (1.524 m)   Wt 177 lb (80.3 kg)   SpO2 95%   BMI 34.57 kg/m       Objective:   Physical Exam Vitals reviewed.  Musculoskeletal:        General: Normal range of motion.     Left hand: Normal.     Comments: No foreign body felt. No redness or warmth present   Skin:    General: Skin is warm and dry.  Neurological:     General: No focal deficit present.     Mental Status: She is oriented to person, place, and time.  Psychiatric:        Mood and Affect: Mood normal.        Behavior: Behavior normal.        Thought Content: Thought content normal.        Judgment: Judgment normal.        Assessment & Plan:  1. Finger pain, left (Primary) -Likely arthritis or bone spur.  Will get x-ray of her left hand and decide on treatment once x-ray has resulted. - DG Hand Complete Left; Future  Darleene Shape, NP

## 2023-12-26 NOTE — Progress Notes (Deleted)
 [facility] MD/PA/NP OP Progress Note  12/27/2023 8:45 AM Jean Nash  MRN:  [id number]  Visit Diagnosis:    ICD-10-CM   1. MDD (major depressive disorder), recurrent, in full remission (HCC)  F33.42     2. Fibromyalgia  M79.7       Assessment: Jean Nash is a 83 y.o. female with a history of MDD, anxiety, and fibromyalgia who presented to [facility] for initial evaluation on 08/11/22.    At initial evaluation patient reported a history of MDD but denied any current symptoms other than increased fatigue.  She noted that the increased fatigue seemed to correlate with when the pregabalin was increased for her fibromyalgia.  Patient denies any other psychiatric symptoms including psychosis, paranoia, delusions, SI/HI, thoughts of self-harm, or neurovegetative signs of depression.  Patient reported struggling with chronic pain symptoms as well as migraines with some benefit from the pregabalin.  She denied any benefit from the Topamax.  Treatment options were discussed and it was decided to taper off of Effexor and start Cymbalta for coverage of MDD and fibromyalgia symptoms.     Jean Nash presents for follow-up evaluation. Today, 12/27/23, reports    that mood is stable. PCP had tapered her off medications in the interim which Jean Nash has tolerated well. Concerns about sleep and memory were reviewed. Sleep hygiene discussed and memory concerns appears to be consistent with normal aging. Will continue to monitor for any concern of dementia. Patient will follow up in 6 months.  Plan: - Discontinue Cymbalta 60 mg QHS - Discontinue Topamax 25 mg QHS managed by PCP to prevent headaches - Tapering off Lyrica managed by PCP - CBC,CMP, TSH, and UA reviewed - Follow up in 6 months   Chief Complaint:  No chief complaint on file.  HPI: Patient had reached out in the interim reporting increased depression after her husband had elevated blood levels which raised  potential concern for pancreatic cancer. Cymbalta was restarted at 30 mg daily at that time.       PCP had patient taper Cymbalta to every 3rd day in the interim due to concern of excess fatigue. Patient had some moderate withdrawal with the medication discontinuation including clamminess. Over time withdrawal symptoms improved and patient was able to discontinue the medication.  Jean Nash presents alongside her husband.  She reports that things have gone well over the past 6 months. She has tapered off a number of her medications with PCP instruction during this period and found no significant change in her mood symptoms or other related issues for which she took medications. She has had improvement in her overall fatigue levels with the discontinuation of many medications.   Today her primary concerns are the short term memory loss and some difficulty with sleep initiation. In regards to sleep, they have been tracking it with their bed, which shows Jean Nash sleeps around 8 hours a night and is awake in bed for around 1.5 hours. She also does take a 1-2 hour nap after breakfast. Reviewed sleep hygiene and some techniques to help manage difficulty with sleep initiation. Patient memory issues appear to be consistent with normal aging. It is possible there is some association with the Lyrica but that is currently in the process of being tapered off. Can continue to monitor for signs of dementia. That does not appear to be a concern at this time however at this time.  Past Psychiatric History: Patient has seen Dr. Vanetta Nash in the past Psychiatry admission:  denies  Previous suicide attempt: denies Past trials of medication: citalopram, and Effexor.  Patient has also taken Topamax for migraines and pregabalin for small fiber neuropathy.  Past Medical History:  Past Medical History:  Diagnosis Date   Adenomatous colon polyp    Anxiety    PHOBIAS   Arthritis    Breast cancer (HCC) 08/08/2013   right LOQ    Cataract    Chronic insomnia    Cluster headaches    history of migraines / NONE FOR SEVERAL YRS   Depression    Diverticulosis    Fatty liver 2011   Fibromyalgia    GERD (gastroesophageal reflux disease)    H/O hiatal hernia    History of transfusion 08/30/2013   Hx of radiation therapy 10/29/13- 12/14/13   right chest wall 5040 cGy 28 sessions, right supraclavicular/axillary region 5040 cGy 28 sessions, right chest wall boost 1000 cGy 5 sessions   Hypothyroidism    Internal hemorrhoids    Irritable bowel syndrome    Kidney stone    Lymphedema    RT ARM - WEARS SLEEVE   Macular degeneration    hole/right eye   MDD (major depressive disorder)    Osteopenia    Osteopenia    Other abnormal glucose    Other and unspecified hyperlipidemia    Pain in joint, shoulder region    Pneumonia 1610,9604   Sleep apnea    USES C-PAP   Stress incontinence, female     Past Surgical History:  Procedure Laterality Date   ABDOMINAL HYSTERECTOMY     APPENDECTOMY     BILATERAL TOTAL MASTECTOMY WITH AXILLARY LYMPH NODE DISSECTION  08/30/2013   Dr Jean Nash   BREAST CYST ASPIRATION     9 cysts   CATARACT EXTRACTION, BILATERAL  2005/2007   CHOLECYSTECTOMY     COLONOSCOPY     COLONOSCOPY WITH PROPOFOL N/A 11/10/2023   Procedure: COLONOSCOPY WITH PROPOFOL;  Surgeon: Jean Lofty., MD;  Location: [facility];  Service: Gastroenterology;  Laterality: N/A;   CYSTOCELE REPAIR     ENDOSCOPIC MUCOSAL RESECTION N/A 11/10/2023   Procedure: ENDOSCOPIC MUCOSAL RESECTION;  Surgeon: Jean Score Jean Nash., MD;  Location: [facility];  Service: Gastroenterology;  Laterality: N/A;   EVACUATION BREAST HEMATOMA Left 08/31/2013   Procedure: EVACUATION HEMATOMA BREAST;  Surgeon: Jean Lint, MD;  Location: [facility];  Service: General;  Laterality: Left;   EYE SURGERY     to repair macular hole   FOOT ARTHROPLASTY     lt    GANGLION CYST EXCISION     rt foot   HEMORRHOID SURGERY     03/1993    HEMOSTASIS CLIP PLACEMENT  11/10/2023   Procedure: HEMOSTASIS CLIP PLACEMENT;  Surgeon: Jean Lofty., MD;  Location: [facility];  Service: Gastroenterology;;   JOINT REPLACEMENT  03/15/11   left knee replacement   KNEE ARTHROSCOPY     /partial knee 2016/left knee 2012   MASS EXCISION  11/04/2011   Procedure: EXCISION MASS;  Surgeon: Wyn Forster., MD;  Location: [facility];  Service: Orthopedics;  Laterality: Right;  excisional biopsy right ulna mass   MASTECTOMY W/ SENTINEL NODE BIOPSY Right 08/30/2013   Procedure: RIGHT  AXILLARY SENTINEL LYMPH NODE BIOPSY; Right Axillary Node Disection;  Surgeon: Jean Lint, MD;  Location: [facility];  Service: General;  Laterality: Right;  Right side nuc med 7:00    PARTIAL KNEE ARTHROPLASTY Right 11/03/2015   Procedure: RIGHT KNEE MEDIAL UNICOMPARTMENTAL ARTHROPLASTY ;  Surgeon: Ollen Gross, MD;  Location: [facility];  Service: Orthopedics;  Laterality: Right;   POLYPECTOMY  11/10/2023   Procedure: POLYPECTOMY;  Surgeon: Mansouraty, Jean Nash., MD;  Location: [facility];  Service: Gastroenterology;;   RECTOCELE REPAIR     SIMPLE MASTECTOMY WITH AXILLARY SENTINEL NODE BIOPSY Left 08/30/2013   Procedure: Bilateral Breast Mastectomy ;  Surgeon: Jean Lint, MD;  Location: [facility];  Service: General;  Laterality: Left;   skin tags removed     breast, panty line, neckline   SUBMUCOSAL LIFTING INJECTION  11/10/2023   Procedure: SUBMUCOSAL LIFTING INJECTION;  Surgeon: Jean Lofty., MD;  Location: [facility];  Service: Gastroenterology;;   TOE SURGERY     preventative crossover toe surg/right foot   TOE SURGERY  2009   left foot/screw  in 2nd toe   TONSILLECTOMY     UPPER GASTROINTESTINAL ENDOSCOPY      Family Psychiatric History: Denies  Family History:  Family History  Problem Relation Age of Onset   Stroke Mother        died age 6   Diabetes Mother    Breast cancer Mother 75   Breast cancer Sister 49    Brain cancer Maternal Uncle 8   Breast cancer Paternal Aunt 62   Breast cancer Paternal Aunt        dx in her 58s   Cancer Maternal Grandmother        intra-abdominal cancer   Diabetes Maternal Grandfather    Breast cancer Paternal Grandmother 74   Brain cancer Cousin 74       maternal cousin   Brain cancer Cousin 20       paternal cousin   Colon cancer Neg Hx    Colon polyps Neg Hx    Crohn's disease Neg Hx    Esophageal cancer Neg Hx    Rectal cancer Neg Hx    Stomach cancer Neg Hx    Ulcerative colitis Neg Hx    Inflammatory bowel disease Neg Hx    Liver disease Neg Hx    Pancreatic cancer Neg Hx     Social History:  Social History   Socioeconomic History   Marital status: Married    Spouse name: [name]   Number of children: 3   Years of education: Not on file   Highest education level: GED or equivalent  Occupational History   Occupation: retired Catering manager  Tobacco Use   Smoking status: Former    Current packs/day: 0.00    Average packs/day: 0.1 packs/day for 2.0 years (0.2 ttl pk-yrs)    Types: Cigarettes    Start date: 11/16/1959    Quit date: 11/15/1960    Years since quitting: 63.1    Passive exposure: Past (over 58 years ago)   Smokeless tobacco: Never  Vaping Use   Vaping status: Never Used  Substance and Sexual Activity   Alcohol use: Yes    Comment: rarely   Drug use: No   Sexual activity: Yes    Comment: menarche age 14, fist live birth 78, P 3, hysterectomy age 49, no HRT, BCP 2 yrs  Other Topics Concern   Not on file  Social History Narrative   Occupation:  Retired Catering manager    Married with 3 grown children      Never Smoked     Alcohol use-no        Social Drivers of Corporate investment banker Strain: Low Risk  (12/22/2023)   Overall Physicist, medical  Strain (CARDIA)    Difficulty of Paying Living Expenses: Not hard at all  Food Insecurity: No Food Insecurity (12/22/2023)   Hunger Vital Sign    Worried About Running Out of Food in the  Last Year: Never true    Ran Out of Food in the Last Year: Never true  Transportation Needs: No Transportation Needs (02/09/2023)   PRAPARE - Administrator, Civil Service (Medical): No    Lack of Transportation (Non-Medical): No  Physical Activity: Inactive (12/22/2023)   Exercise Vital Sign    Days of Exercise per Week: 0 days    Minutes of Exercise per Session: 0 min  Stress: Stress Concern Present (12/22/2023)   [employer] of Occupational Health - Occupational Stress Questionnaire    Feeling of Stress : To some extent  Social Connections: Unknown (12/22/2023)   Social Connection and Isolation Panel [NHANES]    Frequency of Communication with Friends and Family: Patient declined    Frequency of Social Gatherings with Friends and Family: Patient declined    Attends Religious Services: Patient declined    Database administrator or Organizations: No    Attends Engineer, structural: More than 4 times per year    Marital Status: Married    Allergies:  No Known Allergies   Current Medications: Current Outpatient Medications  Medication Sig Dispense Refill   Bacillus Coagulans-Inulin (PROBIOTIC) 1-250 BILLION-MG CAPS Take by mouth.     butalbital-acetaminophen-caffeine (FIORICET) 50-325-40 MG tablet Take 1 tablet by mouth as needed for headache. 30 tablet 2   diazepam (VALIUM) 5 MG tablet Take 5 mg by mouth every 6 (six) hours as needed for anxiety. As needed (flying & dental)     DULoxetine (CYMBALTA) 30 MG capsule TAKE ONE CAPSULE BY MOUTH EVERY EVENING 90 capsule 0   Erenumab-aooe (AIMOVIG) 140 MG/ML SOAJ Inject 140 mg into the skin every 30 (thirty) days. 1.12 mL 3   esomeprazole (NEXIUM) 40 MG capsule Take 1 capsule (40 mg total) by mouth daily at 12 noon. (Patient taking differently: Take 40 mg by mouth every morning.) 365 capsule 0   famotidine (PEPCID) 40 MG tablet TAKE 1 TABLET BY MOUTH AT BEDTIME 90 tablet 0   glucose blood (EMBRACE PRO GLUCOSE TEST)  test strip Use to check blood sugar TID 200 each 12   levothyroxine (SYNTHROID) 100 MCG tablet TAKE 1 TABLET BY MOUTH DAILY 365 tablet 0   nitrofurantoin, macrocrystal-monohydrate, (MACROBID) 100 MG capsule TAKE 1 CAPSULE BY MOUTH AT BEDTIME 365 capsule 0   omega-3 acid ethyl esters (LOVAZA) 1 g capsule TAKE 1 CAPSULE BY MOUTH TWICE A DAY 180 capsule 3   Polyethylene Glycol 3350 (MIRALAX PO) Take by mouth. 1/2 capful every morning     Rimegepant Sulfate (NURTEC) 75 MG TBDP Take 1 tablet as needed for acute migraine attacks 8 tablet 3   No current facility-administered medications for this visit.     Musculoskeletal: Strength & Muscle Tone: within normal limits Gait & Station: normal, unsteady Patient leans: N/A  Psychiatric Specialty Exam: Review of Systems  There were no vitals taken for this visit.There is no height or weight on file to calculate BMI.  General Appearance: Well Groomed  Eye Contact:  Good  Speech:  Clear and Coherent and Normal Rate  Volume:  Normal  Mood:  Anxious and Euthymic  Affect:  Congruent  Thought Process:  Coherent and Goal Directed  Orientation:  Full (Time, Place, and Person)  Thought Content: Logical  Suicidal Thoughts:  No  Homicidal Thoughts:  No  Memory:  NA  Judgement:  Good  Insight:  Good  Psychomotor Activity:  Normal  Concentration:  Concentration: Good  Recall:  Fair  Fund of Knowledge: Fair  Language: Good  Akathisia:  NA    AIMS (if indicated): not done  Assets:  Communication Skills Desire for Improvement Housing Intimacy Leisure Time Resilience Social Support  ADL's:  Intact  Cognition: WNL  Sleep:  Good   Metabolic Disorder Labs: Lab Results  Component Value Date   HGBA1C 6.3 (A) 06/10/2023   MPG 126 (H) 08/27/2013   MPG 117 (H) 06/22/2013   Lab Results  Component Value Date   PROLACTIN 3.9 07/22/2009   Lab Results  Component Value Date   CHOL 153 03/03/2023   TRIG 161 (H) 03/03/2023   HDL 51 03/03/2023    CHOLHDL 3.0 03/03/2023   VLDL 16.1 05/25/2022   LDLCALC 75 03/03/2023   LDLCALC 133 (H) 05/25/2022   Lab Results  Component Value Date   TSH 1.150 03/03/2023   TSH 1.89 05/25/2022    Therapeutic Level Labs: No results found for: "LITHIUM" No results found for: "VALPROATE" No results found for: "CBMZ"   Screenings: GAD-7    Flowsheet Row Office Visit from 01/03/2023 in [facility] [place]  Total GAD-7 Score 7      Mini-Mental    Flowsheet Row Office Visit from 03/03/2023 in [facility] Office Visit from 02/03/2023 in [facility] [place] Office Visit from 10/27/2022 in [facility] [place]  Total Score (max 30 points ) 26 28 29       PHQ2-9    Flowsheet Row Office Visit from 03/15/2023 in [facility] [place] Clinical Support from 02/09/2023 in [facility] [place] Office Visit from 01/03/2023 in [facility] [place] Video Visit from 11/01/2022 in [facility] [place] Office Visit from 08/11/2022 [facility]  PHQ-2 Total Score 4 0 4 0 0  PHQ-9 Total Score 14 0 15 4 --      Flowsheet Row ED from 06/05/2023 in [facility] Emergency Department at [facility] Office Visit from 08/11/2022 in [facility] ED from 08/08/2022 in [facility] Emergency Department at [facility]  C-SSRS RISK CATEGORY No Risk No Risk No Risk       Collaboration of Care: Collaboration of Care: Medication Management AEB medication prescription, Primary Care Provider AEB chart review, and Other provider involved in patient's care AEB GI chart review  Patient/Guardian was advised Release of Information must be obtained prior to any record release in order to collaborate their care with an outside provider.  Patient/Guardian was advised if they have not already done so to contact the registration department to sign all necessary forms in order for [name] to release information regarding their care.   Consent: Patient/Guardian gives verbal consent for treatment and assignment of benefits for services provided during this visit. Patient/Guardian expressed understanding and agreed to proceed.   40 minutes were spent in chart review, interview, psycho education, counseling, medical decision making, coordination of care and long-term prognosis.  Patient was given opportunity to ask question and all concerns and questions were addressed and answers. Excluding separately billable services.   Stasia Cavalier, MD 12/27/2023, 8:45 AM

## 2023-12-27 ENCOUNTER — Encounter: Payer: Self-pay | Admitting: Adult Health

## 2023-12-27 ENCOUNTER — Ambulatory Visit (HOSPITAL_COMMUNITY): Payer: Medicare HMO | Admitting: Psychiatry

## 2024-01-02 ENCOUNTER — Encounter (HOSPITAL_COMMUNITY): Payer: Self-pay | Admitting: Psychiatry

## 2024-01-02 ENCOUNTER — Telehealth (HOSPITAL_BASED_OUTPATIENT_CLINIC_OR_DEPARTMENT_OTHER): Payer: Medicare HMO | Admitting: Psychiatry

## 2024-01-02 DIAGNOSIS — M797 Fibromyalgia: Secondary | ICD-10-CM

## 2024-01-02 DIAGNOSIS — F3342 Major depressive disorder, recurrent, in full remission: Secondary | ICD-10-CM

## 2024-01-02 MED ORDER — DULOXETINE HCL 60 MG PO CPEP: 60.0000 mg | ORAL_CAPSULE | Freq: Every evening | ORAL | 2 refills | Status: DC

## 2024-01-02 MED ORDER — DULOXETINE HCL 60 MG PO CPEP: 60.0000 mg | ORAL_CAPSULE | Freq: Every evening | ORAL | 0 refills | Status: DC

## 2024-01-02 NOTE — Progress Notes (Signed)
 BH MD/PA/NP OP Progress Note  01/02/2024 2:46 PM Jean Nash  MRN:  [id number]  Visit Diagnosis:    ICD-10-CM   1. MDD (major depressive disorder), recurrent, in full remission (HCC)  F33.42 DULoxetine (CYMBALTA) 60 MG capsule    DISCONTINUED: DULoxetine (CYMBALTA) 60 MG capsule    2. Fibromyalgia  M79.7      Assessment: Jean Nash is a 83 y.o. female with a history of MDD, anxiety, and fibromyalgia who presented to [facility] at [facility] for initial evaluation on 08/11/22.    At initial evaluation patient reported a history of MDD but denied any current symptoms other than increased fatigue.  She noted that the increased fatigue seemed to correlate with when the pregabalin was increased for her fibromyalgia.  Patient denies any other psychiatric symptoms including psychosis, paranoia, delusions, SI/HI, thoughts of self-harm, or neurovegetative signs of depression.  Patient reported struggling with chronic pain symptoms as well as migraines with some benefit from the pregabalin.  She denied any benefit from the Topamax.  Treatment options were discussed and it was decided to taper off of Effexor and start Cymbalta for coverage of MDD and fibromyalgia symptoms.     Jean Nash presents for follow-up evaluation. Today, 01/02/24, reports an increase in depression in the interim due to situational stressors and continued somatic stressors. Support was provided around this. Patient was started on Cymbalta 30 mg in the interim and dose was increased to 60 mg today. Risks and benefits were reviewed. Patient was referred to therapy and we will follow up in 2 months.   Plan: - Restart Cymbalta 60 mg QHS - Tapering off Lyrica managed by PCP - CBC,CMP, TSH, and UA reviewed - Therapy referral - Follow up in 2 months   Chief Complaint:  Chief Complaint  Patient presents with   Follow-up   HPI: Patient had reached out in the interim reporting increased depression  after her husband had elevated blood levels which raised potential concern for pancreatic cancer. Cymbalta was restarted at 30 mg daily at that time.   Since then they have found that her husband has pancreatic cancer and he is undergoing treatment. He will need a surgery after finishing up the first round of chemo. In addition to this Jean Nash has had migraine headaches on and off for the past 2 weeks. She has limited relief from over the counter medications and her Aimovig. With these she has been napping during the day to get through this and thus staying up all nights. Jean Nash has been struggling with these headaches on and off for her whole life. She has an appointment with a neurologist in April.  Patient reports that she does feel like she is more depressed with feelings of worthlessness and being a burden. She has tried to pick up more of the load around the house since her husbands diagnosis but feels mentally and physically unable to do so. Support was provided around this and we encouraged patient to set realistic goals. Also discussed the importance of proper diet and behavioral activation. Jean Nash denies any SI and has great support from her family and friends.   Medication wise she has restarted the Cymbalta and denies any adverse side effects. We will increase the dose to 60 mg at bedtime. Risk and benefits were reviewed. We also discussed therapy referral which patient was open to.   Past Psychiatric History: Patient has seen Dr. [name] in the past Psychiatry admission: denies  Previous suicide attempt: denies Past  trials of medication: citalopram, and Effexor.  Patient has also taken Topamax for migraines and pregabalin for small fiber neuropathy.  Past Medical History:  Past Medical History:  Diagnosis Date   Adenomatous colon polyp    Anxiety    PHOBIAS   Arthritis    Breast cancer (HCC) 08/08/2013   right LOQ   Cataract    Chronic insomnia    Cluster headaches    history of  migraines / NONE FOR SEVERAL YRS   Depression    Diverticulosis    Fatty liver 2011   Fibromyalgia    GERD (gastroesophageal reflux disease)    H/O hiatal hernia    History of transfusion 08/30/2013   Hx of radiation therapy 10/29/13- 12/14/13   right chest wall 5040 cGy 28 sessions, right supraclavicular/axillary region 5040 cGy 28 sessions, right chest wall boost 1000 cGy 5 sessions   Hypothyroidism    Internal hemorrhoids    Irritable bowel syndrome    Kidney stone    Lymphedema    RT ARM - WEARS SLEEVE   Macular degeneration    hole/right eye   MDD (major depressive disorder)    Osteopenia    Osteopenia    Other abnormal glucose    Other and unspecified hyperlipidemia    Pain in joint, shoulder region    Pneumonia 1610,9604   Sleep apnea    USES C-PAP   Stress incontinence, female     Past Surgical History:  Procedure Laterality Date   ABDOMINAL HYSTERECTOMY     APPENDECTOMY     BILATERAL TOTAL MASTECTOMY WITH AXILLARY LYMPH NODE DISSECTION  08/30/2013   Dr Donell Beers   BREAST CYST ASPIRATION     9 cysts   CATARACT EXTRACTION, BILATERAL  2005/2007   CHOLECYSTECTOMY     COLONOSCOPY     COLONOSCOPY WITH PROPOFOL N/A 11/10/2023   Procedure: COLONOSCOPY WITH PROPOFOL;  Surgeon: Lemar Lofty., MD;  Location: [facility];  Service: Gastroenterology;  Laterality: N/A;   CYSTOCELE REPAIR     ENDOSCOPIC MUCOSAL RESECTION N/A 11/10/2023   Procedure: ENDOSCOPIC MUCOSAL RESECTION;  Surgeon: Meridee Score Netty Starring., MD;  Location: [facility];  Service: Gastroenterology;  Laterality: N/A;   EVACUATION BREAST HEMATOMA Left 08/31/2013   Procedure: EVACUATION HEMATOMA BREAST;  Surgeon: Almond Lint, MD;  Location: [facility];  Service: General;  Laterality: Left;   EYE SURGERY     to repair macular hole   FOOT ARTHROPLASTY     lt    GANGLION CYST EXCISION     rt foot   HEMORRHOID SURGERY     03/1993   HEMOSTASIS CLIP PLACEMENT  11/10/2023   Procedure: HEMOSTASIS CLIP  PLACEMENT;  Surgeon: Lemar Lofty., MD;  Location: [facility];  Service: Gastroenterology;;   JOINT REPLACEMENT  03/15/11   left knee replacement   KNEE ARTHROSCOPY     /partial knee 2016/left knee 2012   MASS EXCISION  11/04/2011   Procedure: EXCISION MASS;  Surgeon: Wyn Forster., MD;  Location: [facility];  Service: Orthopedics;  Laterality: Right;  excisional biopsy right ulna mass   MASTECTOMY W/ SENTINEL NODE BIOPSY Right 08/30/2013   Procedure: RIGHT  AXILLARY SENTINEL LYMPH NODE BIOPSY; Right Axillary Node Disection;  Surgeon: Almond Lint, MD;  Location: [facility];  Service: General;  Laterality: Right;  Right side nuc med 7:00    PARTIAL KNEE ARTHROPLASTY Right 11/03/2015   Procedure: RIGHT KNEE MEDIAL UNICOMPARTMENTAL ARTHROPLASTY ;  Surgeon: Ollen Gross, MD;  Location:  [facility];  Service: Orthopedics;  Laterality: Right;   POLYPECTOMY  11/10/2023   Procedure: POLYPECTOMY;  Surgeon: Mansouraty, Netty Starring., MD;  Location: [facility];  Service: Gastroenterology;;   RECTOCELE REPAIR     SIMPLE MASTECTOMY WITH AXILLARY SENTINEL NODE BIOPSY Left 08/30/2013   Procedure: Bilateral Breast Mastectomy ;  Surgeon: Almond Lint, MD;  Location: [facility];  Service: General;  Laterality: Left;   skin tags removed     breast, panty line, neckline   SUBMUCOSAL LIFTING INJECTION  11/10/2023   Procedure: SUBMUCOSAL LIFTING INJECTION;  Surgeon: Lemar Lofty., MD;  Location: [facility];  Service: Gastroenterology;;   TOE SURGERY     preventative crossover toe surg/right foot   TOE SURGERY  2009   left foot/screw  in 2nd toe   TONSILLECTOMY     UPPER GASTROINTESTINAL ENDOSCOPY      Family Psychiatric History: Denies  Family History:  Family History  Problem Relation Age of Onset   Stroke Mother        died age 100   Diabetes Mother    Breast cancer Mother 69   Breast cancer Sister 7   Brain cancer Maternal Uncle 8   Breast cancer Paternal Aunt 39    Breast cancer Paternal Aunt        dx in her 75s   Cancer Maternal Grandmother        intra-abdominal cancer   Diabetes Maternal Grandfather    Breast cancer Paternal Grandmother 68   Brain cancer Cousin 26       maternal cousin   Brain cancer Cousin 20       paternal cousin   Colon cancer Neg Hx    Colon polyps Neg Hx    Crohn's disease Neg Hx    Esophageal cancer Neg Hx    Rectal cancer Neg Hx    Stomach cancer Neg Hx    Ulcerative colitis Neg Hx    Inflammatory bowel disease Neg Hx    Liver disease Neg Hx    Pancreatic cancer Neg Hx     Social History:  Social History   Socioeconomic History   Marital status: Married    Spouse name: [name]   Number of children: 3   Years of education: Not on file   Highest education level: GED or equivalent  Occupational History   Occupation: retired Catering manager  Tobacco Use   Smoking status: Former    Current packs/day: 0.00    Average packs/day: 0.1 packs/day for 2.0 years (0.2 ttl pk-yrs)    Types: Cigarettes    Start date: 11/16/1959    Quit date: 11/15/1960    Years since quitting: 63.1    Passive exposure: Past (over 58 years ago)   Smokeless tobacco: Never  Vaping Use   Vaping status: Never Used  Substance and Sexual Activity   Alcohol use: Yes    Comment: rarely   Drug use: No   Sexual activity: Yes    Comment: menarche age 41, fist live birth 76, P 3, hysterectomy age 65, no HRT, BCP 2 yrs  Other Topics Concern   Not on file  Social History Narrative   Occupation:  Retired Catering manager    Married with 3 grown children      Never Smoked     Alcohol use-no        Social Drivers of Corporate investment banker Strain: Low Risk  (12/22/2023)   Overall Financial Resource Strain (CARDIA)    Difficulty  of Paying Living Expenses: Not hard at all  Food Insecurity: No Food Insecurity (12/22/2023)   Hunger Vital Sign    Worried About Running Out of Food in the Last Year: Never true    Ran Out of Food in the Last Year: Never true   Transportation Needs: No Transportation Needs (02/09/2023)   PRAPARE - Administrator, Civil Service (Medical): No    Lack of Transportation (Non-Medical): No  Physical Activity: Inactive (12/22/2023)   Exercise Vital Sign    Days of Exercise per Week: 0 days    Minutes of Exercise per Session: 0 min  Stress: Stress Concern Present (12/22/2023)   [employer] of Occupational Health - Occupational Stress Questionnaire    Feeling of Stress : To some extent  Social Connections: Unknown (12/22/2023)   Social Connection and Isolation Panel [NHANES]    Frequency of Communication with Friends and Family: Patient declined    Frequency of Social Gatherings with Friends and Family: Patient declined    Attends Religious Services: Patient declined    Database administrator or Organizations: No    Attends Engineer, structural: More than 4 times per year    Marital Status: Married    Allergies:  No Known Allergies   Current Medications: Current Outpatient Medications  Medication Sig Dispense Refill   Bacillus Coagulans-Inulin (PROBIOTIC) 1-250 BILLION-MG CAPS Take by mouth.     butalbital-acetaminophen-caffeine (FIORICET) 50-325-40 MG tablet Take 1 tablet by mouth as needed for headache. 30 tablet 2   diazepam (VALIUM) 5 MG tablet Take 5 mg by mouth every 6 (six) hours as needed for anxiety. As needed (flying & dental)     DULoxetine (CYMBALTA) 60 MG capsule Take 1 capsule (60 mg total) by mouth every evening. 90 capsule 0   Erenumab-aooe (AIMOVIG) 140 MG/ML SOAJ Inject 140 mg into the skin every 30 (thirty) days. 1.12 mL 3   esomeprazole (NEXIUM) 40 MG capsule Take 1 capsule (40 mg total) by mouth daily at 12 noon. (Patient taking differently: Take 40 mg by mouth every morning.) 365 capsule 0   famotidine (PEPCID) 40 MG tablet TAKE 1 TABLET BY MOUTH AT BEDTIME 90 tablet 0   glucose blood (EMBRACE PRO GLUCOSE TEST) test strip Use to check blood sugar TID 200 each 12    levothyroxine (SYNTHROID) 100 MCG tablet TAKE 1 TABLET BY MOUTH DAILY 365 tablet 0   nitrofurantoin, macrocrystal-monohydrate, (MACROBID) 100 MG capsule TAKE 1 CAPSULE BY MOUTH AT BEDTIME 365 capsule 0   omega-3 acid ethyl esters (LOVAZA) 1 g capsule TAKE 1 CAPSULE BY MOUTH TWICE A DAY 180 capsule 3   Polyethylene Glycol 3350 (MIRALAX PO) Take by mouth. 1/2 capful every morning     Rimegepant Sulfate (NURTEC) 75 MG TBDP Take 1 tablet as needed for acute migraine attacks 8 tablet 3   No current facility-administered medications for this visit.     Musculoskeletal: Strength & Muscle Tone: within normal limits Gait & Station: normal, unsteady Patient leans: N/A  Psychiatric Specialty Exam: Review of Systems  There were no vitals taken for this visit.There is no height or weight on file to calculate BMI.  General Appearance: Fairly Groomed  Eye Contact:  Good  Speech:  Clear and Coherent and Normal Rate  Volume:  Normal  Mood:  Depressed  Affect:  Congruent  Thought Process:  Coherent and Goal Directed  Orientation:  Full (Time, Place, and Person)  Thought Content: Logical   Suicidal Thoughts:  No  Homicidal Thoughts:  No  Memory:  NA  Judgement:  Fair  Insight:  Fair  Psychomotor Activity:  Normal  Concentration:  Concentration: Good  Recall:  Fair  Fund of Knowledge: Fair  Language: Good  Akathisia:  NA    AIMS (if indicated): not done  Assets:  Communication Skills Desire for Improvement Housing Intimacy Leisure Time Resilience Social Support  ADL's:  Intact  Cognition: WNL  Sleep:  Good   Metabolic Disorder Labs: Lab Results  Component Value Date   HGBA1C 6.3 (A) 06/10/2023   MPG 126 (H) 08/27/2013   MPG 117 (H) 06/22/2013   Lab Results  Component Value Date   PROLACTIN 3.9 07/22/2009   Lab Results  Component Value Date   CHOL 153 03/03/2023   TRIG 161 (H) 03/03/2023   HDL 51 03/03/2023   CHOLHDL 3.0 03/03/2023   VLDL 17.5 05/25/2022   LDLCALC 75  03/03/2023   LDLCALC 133 (H) 05/25/2022   Lab Results  Component Value Date   TSH 1.150 03/03/2023   TSH 1.89 05/25/2022    Therapeutic Level Labs: No results found for: "LITHIUM" No results found for: "VALPROATE" No results found for: "CBMZ"   Screenings: GAD-7    Flowsheet Row Office Visit from 01/03/2023 in [facility] [place]  Total GAD-7 Score 7      Mini-Mental    Flowsheet Row Office Visit from 03/03/2023 in [facility] Office Visit from 02/03/2023 in [facility] [place] Office Visit from 10/27/2022 in [facility] [place] [facility] [place]  Total Score (max 30 points ) 26 28 29       PHQ2-9    Flowsheet Row Office Visit from 03/15/2023 in [facility] [place] Clinical Support from 02/09/2023 in [facility] [place] Office Visit from 01/03/2023 in [facility] [place] Video Visit from 11/01/2022 in [facility] [place] Office Visit from 08/11/2022 [facility]  PHQ-2 Total Score 4 0 4 0 0  PHQ-9 Total Score 14 0 15 4 --      Flowsheet Row ED from 06/05/2023 in [facility] at [facility] Office Visit from 08[facility]11/2022 in [facility] ED from 08/08/2022 in [facility] Emergency Department at [facility]  C-SSRS RISK CATEGORY No Risk No Risk No Risk       Collaboration of Care: Collaboration of Care: Medication Management AEB medication prescription, Primary Care Provider AEB chart review, and Other provider involved in patient's care AEB GI chart review  Patient/Guardian was advised Release of Information must be obtained prior to any record release in order to collaborate their care with an outside provider. Patient/Guardian was advised if they have not already done so to contact  the registration department to sign all necessary forms in order for [name] to release information regarding their care.   Consent: Patient/Guardian gives verbal consent for treatment and assignment of benefits for services provided during this visit. Patient/Guardian expressed understanding and agreed to proceed.   40 minutes were spent in chart review, interview, psycho education, counseling, medical decision making, coordination of care and long-term prognosis.  Patient was given opportunity to ask question and all concerns and questions were addressed and answers. Excluding separately billable services.   Stasia Cavalier, MD 01/02/2024, 2:46 PM

## 2024-01-12 ENCOUNTER — Encounter: Payer: Self-pay | Admitting: Adult Health

## 2024-01-12 ENCOUNTER — Ambulatory Visit (INDEPENDENT_AMBULATORY_CARE_PROVIDER_SITE_OTHER): Payer: Medicare HMO | Admitting: Adult Health

## 2024-01-12 VITALS — BP 120/80 | HR 89 | Temp 98.2°F | Ht 60.0 in | Wt 179.0 lb

## 2024-01-12 DIAGNOSIS — R5382 Chronic fatigue, unspecified: Secondary | ICD-10-CM

## 2024-01-12 NOTE — Progress Notes (Signed)
 Subjective:    [name], female    DOB: March 13, 1941, 83 y.o.   MRN: [id number]  HPI 83 year old female who  has a past medical history of Adenomatous colon polyp, Anxiety, Arthritis, Breast cancer (HCC) (08/08/2013), Cataract, Chronic insomnia, Cluster headaches, Depression, Diverticulosis, Fatty liver (2011), Fibromyalgia, GERD (gastroesophageal reflux disease), H/O hiatal hernia, History of transfusion (08/30/2013), radiation therapy (10/29/13- 12/14/13), Hypothyroidism, Internal hemorrhoids, Irritable bowel syndrome, Kidney stone, Lymphedema, Macular degeneration, MDD (major depressive disorder), Osteopenia, Osteopenia, Other abnormal glucose, Other and unspecified hyperlipidemia, Pain in joint, shoulder region, Pneumonia (7829,5621), Sleep apnea, and Stress incontinence, female.  She presents to the office today with her husband for chronic fatigue, she is sleeping 16 hours a day.  For chronic fatigue.  In the past she has been worked up for this multiple times without known cause.  She will often wake up in the morning and soon after breakfast go back asleep because she is tired.  She will also need an afternoon nap.  She does not sleep well at night due to sleeping during the day.  She had does have known history of OSA with her last sleep study being in 2016.  She is no longer wearing her CPAP. She was seen for chronic fatigue back in October 2024 she was referred to pulmonary for repeat sleep study but it does not look like this was every scheduled.  She does snore but husband reports that he has not noticed any apnea.   She leads a sedentary lifestyle.    Review of Systems See HPI   Past Medical History:  Diagnosis Date   Adenomatous colon polyp    Anxiety    PHOBIAS   Arthritis    Breast cancer (HCC) 08/08/2013   right LOQ   Cataract    Chronic insomnia    Cluster headaches    history of migraines / NONE FOR SEVERAL YRS   Depression    Diverticulosis     Fatty liver 2011   Fibromyalgia    GERD (gastroesophageal reflux disease)    H/O hiatal hernia    History of transfusion 08/30/2013   Hx of radiation therapy 10/29/13- 12/14/13   right chest wall 5040 cGy 28 sessions, right supraclavicular/axillary region 5040 cGy 28 sessions, right chest wall boost 1000 cGy 5 sessions   Hypothyroidism    Internal hemorrhoids    Irritable bowel syndrome    Kidney stone    Lymphedema    RT ARM - WEARS SLEEVE   Macular degeneration    hole/right eye   MDD (major depressive disorder)    Osteopenia    Osteopenia    Other abnormal glucose    Other and unspecified hyperlipidemia    Pain in joint, shoulder region    Pneumonia 3086,5784   Sleep apnea    USES C-PAP   Stress incontinence, female     Social History   Socioeconomic History   Marital status: Married    Spouse name: [name]   Number of children: 3   Years of education: Not on file   Highest education level: GED or equivalent  Occupational History   Occupation: retired bookkeeper  Tobacco Use   Smoking status: Former    Current packs/day: 0.00    Average packs/day: 0.1 packs/day for 2.0 years (0.2 ttl pk-yrs)    Types: Cigarettes    Start date: 11/16/1959    Quit date: 11/15/1960    Years since quitting: 63.2    Passive  exposure: Past (over 58 years ago)   Smokeless tobacco: Never  Vaping Use   Vaping status: Never Used  Substance and Sexual Activity   Alcohol use: Yes    Comment: rarely   Drug use: No   Sexual activity: Yes    Comment: menarche age 31, fist live birth 25, P 3, hysterectomy age 43, no HRT, BCP 2 yrs  Other Topics Concern   Not on file  Social History Narrative   Occupation:  Retired Catering manager    Married with 3 grown children      Never Smoked     Alcohol use-no        Social Drivers of Corporate investment banker Strain: Low Risk  (12/22/2023)   Overall Financial Resource Strain (CARDIA)    Difficulty of Paying Living Expenses: Not hard at all  Food  Insecurity: No Food Insecurity (12/22/2023)   Hunger Vital Sign    Worried About Running Out of Food in the Last Year: Never true    Ran Out of Food in the Last Year: Never true  Transportation Needs: No Transportation Needs (02/09/2023)   PRAPARE - Administrator, Civil Service (Medical): No    Lack of Transportation (Non-Medical): No  Physical Activity: Inactive (12/22/2023)   Exercise Vital Sign    Days of Exercise per Week: 0 days    Minutes of Exercise per Session: 0 min  Stress: Stress Concern Present (12/22/2023)   [employer] of Occupational Health - Occupational Stress Questionnaire    Feeling of Stress : To some extent  Social Connections: Unknown (12/22/2023)   Social Connection and Isolation Panel [NHANES]    Frequency of Communication with Friends and Family: Patient declined    Frequency of Social Gatherings with Friends and Family: Patient declined    Attends Religious Services: Patient declined    Active Member of Clubs or Organizations: No    Attends Engineer, structural: More than 4 times per year    Marital Status: Married  Catering manager Violence: Not At Risk (02/09/2023)   Humiliation, Afraid, Rape, and Kick questionnaire    Fear of Current or Ex-Partner: No    Emotionally Abused: No    Physically Abused: No    Sexually Abused: No    Past Surgical History:  Procedure Laterality Date   ABDOMINAL HYSTERECTOMY     APPENDECTOMY     BILATERAL TOTAL MASTECTOMY WITH AXILLARY LYMPH NODE DISSECTION  08/30/2013   Dr Donell Beers   BREAST CYST ASPIRATION     9 cysts   CATARACT EXTRACTION, BILATERAL  2005/2007   CHOLECYSTECTOMY     COLONOSCOPY     COLONOSCOPY WITH PROPOFOL N/A 11/10/2023   Procedure: COLONOSCOPY WITH PROPOFOL;  Surgeon: Lemar Lofty., MD;  Location: [facility];  Service: Gastroenterology;  Laterality: N/A;   CYSTOCELE REPAIR     ENDOSCOPIC MUCOSAL RESECTION N/A 11/10/2023   Procedure: ENDOSCOPIC MUCOSAL RESECTION;   Surgeon: Meridee Score Netty Starring., MD;  Location: [facility];  Service: Gastroenterology;  Laterality: N/A;   EVACUATION BREAST HEMATOMA Left 08/31/2013   Procedure: EVACUATION HEMATOMA BREAST;  Surgeon: Almond Lint, MD;  Location: [facility];  Service: General;  Laterality: Left;   EYE SURGERY     to repair macular hole   FOOT ARTHROPLASTY     lt    GANGLION CYST EXCISION     rt foot   HEMORRHOID SURGERY     03/1993   HEMOSTASIS CLIP PLACEMENT  11/10/2023   Procedure:  HEMOSTASIS CLIP PLACEMENT;  Surgeon: Meridee Score Netty Starring., MD;  Location: [facility];  Service: Gastroenterology;;   JOINT REPLACEMENT  03/15/11   left knee replacement   KNEE ARTHROSCOPY     /partial knee 2016/left knee 2012   MASS EXCISION  11/04/2011   Procedure: EXCISION MASS;  Surgeon: Wyn Forster., MD;  Location: [facility];  Service: Orthopedics;  Laterality: Right;  excisional biopsy right ulna mass   MASTECTOMY W/ SENTINEL NODE BIOPSY Right 08/30/2013   Procedure: RIGHT  AXILLARY SENTINEL LYMPH NODE BIOPSY; Right Axillary Node Disection;  Surgeon: Almond Lint, MD;  Location: [facility];  Service: General;  Laterality: Right;  Right side nuc med 7:00    PARTIAL KNEE ARTHROPLASTY Right 11/03/2015   Procedure: RIGHT KNEE MEDIAL UNICOMPARTMENTAL ARTHROPLASTY ;  Surgeon: Ollen Gross, MD;  Location: [facility];  Service: Orthopedics;  Laterality: Right;   POLYPECTOMY  11/10/2023   Procedure: POLYPECTOMY;  Surgeon: Mansouraty, Netty Starring., MD;  Location: [facility];  Service: Gastroenterology;;   RECTOCELE REPAIR     SIMPLE MASTECTOMY WITH AXILLARY SENTINEL NODE BIOPSY Left 08/30/2013   Procedure: Bilateral Breast Mastectomy ;  Surgeon: Almond Lint, MD;  Location: [facility];  Service: General;  Laterality: Left;   skin tags removed     breast, panty line, neckline   SUBMUCOSAL LIFTING INJECTION  11/10/2023   Procedure: SUBMUCOSAL LIFTING INJECTION;  Surgeon: Lemar Lofty., MD;  Location: [facility];  Service: Gastroenterology;;   TOE SURGERY     preventative crossover toe surg/right foot   TOE SURGERY  2009   left foot/screw  in 2nd toe   TONSILLECTOMY     UPPER GASTROINTESTINAL ENDOSCOPY      Family History  Problem Relation Age of Onset   Stroke Mother        died age 68   Diabetes Mother    Breast cancer Mother 41   Breast cancer Sister 36   Brain cancer Maternal Uncle 8   Breast cancer Paternal Aunt 91   Breast cancer Paternal Aunt        dx in her 52s   Cancer Maternal Grandmother        intra-abdominal cancer   Diabetes Maternal Grandfather    Breast cancer Paternal Grandmother 24   Brain cancer Cousin 21       maternal cousin   Brain cancer Cousin 20       paternal cousin   Colon cancer Neg Hx    Colon polyps Neg Hx    Crohn's disease Neg Hx    Esophageal cancer Neg Hx    Rectal cancer Neg Hx    Stomach cancer Neg Hx    Ulcerative colitis Neg Hx    Inflammatory bowel disease Neg Hx    Liver disease Neg Hx    Pancreatic cancer Neg Hx     No Known Allergies  Current Outpatient Medications on File Prior to Visit  Medication Sig Dispense Refill   Bacillus Coagulans-Inulin (PROBIOTIC) 1-250 BILLION-MG CAPS Take by mouth.     butalbital-acetaminophen-caffeine (FIORICET) 50-325-40 MG tablet Take 1 tablet by mouth as needed for headache. 30 tablet 2   diazepam (VALIUM) 5 MG tablet Take 5 mg by mouth every 6 (six) hours as needed for anxiety. As needed (flying & dental)     DULoxetine (CYMBALTA) 60 MG capsule Take 1 capsule (60 mg total) by mouth every evening. 90 capsule 0   Erenumab-aooe (AIMOVIG) 140 MG/ML SOAJ Inject 140 mg  into the skin every 30 (thirty) days. 1.12 mL 3   esomeprazole (NEXIUM) 40 MG capsule Take 1 capsule (40 mg total) by mouth daily at 12 noon. (Patient taking differently: Take 40 mg by mouth every morning.) 365 capsule 0   famotidine (PEPCID) 40 MG tablet TAKE 1 TABLET BY MOUTH AT BEDTIME 90 tablet 0   glucose blood (EMBRACE PRO  GLUCOSE TEST) test strip Use to check blood sugar TID 200 each 12   levothyroxine (SYNTHROID) 100 MCG tablet TAKE 1 TABLET BY MOUTH DAILY 365 tablet 0   nitrofurantoin, macrocrystal-monohydrate, (MACROBID) 100 MG capsule TAKE 1 CAPSULE BY MOUTH AT BEDTIME 365 capsule 0   omega-3 acid ethyl esters (LOVAZA) 1 g capsule TAKE 1 CAPSULE BY MOUTH TWICE A DAY 180 capsule 3   Polyethylene Glycol 3350 (MIRALAX PO) Take by mouth. 1/2 capful every morning     Rimegepant Sulfate (NURTEC) 75 MG TBDP Take 1 tablet as needed for acute migraine attacks 8 tablet 3   No current facility-administered medications on file prior to visit.    BP 120/80   Pulse 89   Temp 98.2 F (36.8 C) (Oral)   Ht 5' (1.524 m)   Wt 179 lb (81.2 kg)   SpO2 95%   BMI 34.96 kg/m       Objective:   Physical Exam Constitutional:      Appearance: Normal appearance.  Cardiovascular:     Rate and Rhythm: Normal rate and regular rhythm.     Pulses: Normal pulses.     Heart sounds: Normal heart sounds.  Pulmonary:     Effort: Pulmonary effort is normal.     Breath sounds: Normal breath sounds.  Musculoskeletal:        General: Normal range of motion.  Skin:    General: Skin is warm and dry.  Neurological:     General: No focal deficit present.     Mental Status: She is alert and oriented to person, place, and time.  Psychiatric:        Mood and Affect: Mood normal.        Behavior: Behavior normal.        Thought Content: Thought content normal.        Judgment: Judgment normal.       Assessment & Plan:  1. Chronic fatigue (Primary) - We will work on getting her scheduled with pulmonary  - Needs to start exercising   Shirline Frees, NP  Time spent with patient today was 33 minutes which consisted of chart review, discussing chronic fatigue,  work up, treatment answering questions, listening,  and documentation.

## 2024-01-18 NOTE — Progress Notes (Signed)
 HPI female nonsmoker followed for OSA, insomnia, complicated by hypothyroid, asthma, GERD, psoriasis, CA L breast/ biMastectomy  Home Sleep Test 03/06/2015- severe obstructive sleep apnea/hypopnea syndrome, AHI 44.9 per hour with desaturation to 74% and mean saturation only 89% on room air. Body weight 190 pounds   [place] 4/24  -----------------------------------------------------------------------------------   02/26/20- 83 year old female former smoker followed for OSA, insomnia, complicated by hypothyroid, asthma, GERD, psoriasis, CA  Breast/ bilat mastectomy CPAP auto 8-15/Advanced - dc 'd with weight loss down from 188> 159 lbs Body weight today- 176 lbs -----=f/u pulmonary fibrosis/ OSA  CTa chest 02/26/18- had shown Mild right apical and right greater than left anterior subpleural fibrosis Not using CPAP after weight los. Told she snored at dentist, but husband says "not like before". Looks forward to pool opening so she can swim again to lose weight.  Some morning dry cough- reviewed CXR report. She had XRT as part of breast cancer Rx. Not much aware of GERD, but considered for potential contribution to fibrosis on CXR. We will be watching for progression.  CXR 01/30/20-  The cardiomediastinal contours are unchanged. Normal heart size with mild aortic tortuosity. The lungs are clear. Pulmonary vasculature is normal. No consolidation, pleural effusion, or pneumothorax. No acute osseous abnormalities are seen. Surgical clips in the right greater than left axilla.  IMPRESSION: No acute findings or explanation for cough. BaSwallow 02/13/20- IMPRESSION: No mass or stricture. Spontaneous gastroesophageal reflux to the mid esophagus. Episodes of coughing during this study did not always coincide with visible reflux.  02/25/21- 83 year old female former smoker followed for OSA(quit CPAP with weight loss), insomnia, ILD,  complicated by hypothyroid, asthma, GERD, psoriasis, CA  Breast/ bilat  mastectomy/ XRT, Low Back Pain CPAP auto 8-15/Advanced - CPAP dc 'd with weight loss down from 188> 159 lbs Body weight today-180 lbs Covid vax-3 Phizer Flu vax-had Feels ok w/o CPA. Averages 7 hrs sleep with nocturia x 4. Husband says she isn't snoring much. Waiting for pool to open so she can get back to swimming exercise.   01/19/24- 83 year old female former smoker followed for OSA(quit CPAP with weight loss), insomnia, ILD,  complicated by hypothyroid, asthma, GERD, psoriasis, CA  Breast/ bilat mastectomy/ XRT, Low Back Pain CPAP auto 8-15/Advanced - CPAP dc 'd with weight loss down from 188> 159 lbs Body weight today-183 lbs Referred back by Ardelle Lesches, NP due to chronic fatigue She has regained the weight she lost when she chose to stop CPAP. Husband is now concdrned about her excessive somnolence and snoring, with concern her OSA is again significant. Discussed the use of AI scribe software for clinical note transcription with the patient, who gave verbal consent to proceed. History of Present Illness   The patient, with a history of breast cancer and currently on Macrobid, levothyroxine, Fioricet, and Valium (as needed), presents with excessive daytime sleepiness. She denies taking any sleep aids or caffeine beyond one large cup of coffee in the morning. The patient's spouse reports that the patient often dozes off while sitting quietly and frequently expresses feeling tired. This has been ongoing for approximately a year. She stopped using CPAP in the past when she lost weight, but she has now regained that weight.  The patient also reports a history of headaches and is on a new medication for this. She is also due to start another new medication, which is yet to be picked up from the pharmacy.    ROS-see HPI   + = positive Constitutional:   +  diet weight loss, night sweats, fevers, chills, +fatigue, lassitude. HEENT:    headaches, difficulty swallowing, +tooth/dental problems, sore  throat,       sneezing, itching, ear ache, nasal congestion, post nasal drip, snoring CV:    chest pain, orthopnea, PND, swelling in lower extremities, anasarca,                                                 dizziness, palpitations Resp:   +shortness of breath with exertion or at rest.                productive cough,  + non-productive cough, coughing up of blood.              change in color of mucus.  wheezing.   Skin:    rash or lesions. GI:     +heartburn, indigestion, abdominal pain, nausea, vomiting, diarrhea,                 change in bowel habits, loss of appetite GU: dysuria, change in color of urine, no urgency or frequency.   flank pain. MS:   +joint pain, stiffness, decreased range of motion, back pain. Neuro-     nothing unusual Psych:  change in mood or affect.  depression or anxiety.   memory loss.  OBJ- Physical Exam General- Alert, Oriented, Affect-appropriate, Distress- none acute, + obese Skin- rash-none, lesions- none, excoriation- none Lymphadenopathy- none Head- atraumatic            Eyes- Gross vision intact, PERRLA, conjunctivae and secretions clear            Ears- Hearing, canals-normal            Nose- Clear, no-Septal dev, mucus, polyps, erosion, perforation             Throat- Mallampati III-IV , mucosa clear , drainage- none, tonsils- atrophic Neck- flexible , trachea midline, no stridor , thyroid nl, carotid no bruit Chest - symmetrical excursion , unlabored           Heart/CV- RRR , no murmur , no gallop  , no rub, nl s1 s2                           - JVD- none , edema- none, stasis changes- none, varices- none           Lung- +minimal crackles in bases  wheeze- none, cough-none , dullness-none, rub- none           Chest wall- + bilateral mastectomy Abd-  Br/ Gen/ Rectal- Not done, not indicated Extrem- cyanosis- none, clubbing- none, atrophy- none, strength- nl, + R arm elastic sleeve Neuro- grossly intact to observation Assessment and Plan:     Excessive daytime sleepiness Suspected sleep apnea as a contributing factor. - Order home sleep test to evaluate for sleep apnea. - Instruct her how to use the home sleep test kit for three consecutive nights. - Advise her to contact the office if results are not communicated within two weeks after the test.     Obesity- -encourage diet, exercise

## 2024-01-19 ENCOUNTER — Ambulatory Visit: Payer: Medicare HMO | Admitting: Internal Medicine

## 2024-01-19 ENCOUNTER — Encounter: Payer: Self-pay | Admitting: Internal Medicine

## 2024-01-19 VITALS — BP 136/78 | HR 90 | Temp 97.6°F | Ht 60.0 in | Wt 183.8 lb

## 2024-01-19 DIAGNOSIS — G4733 Obstructive sleep apnea (adult) (pediatric): Secondary | ICD-10-CM

## 2024-01-19 NOTE — Patient Instructions (Signed)
 Order- schedule home sleep test      dx OSA  Please call me about 2 weeks after your sleep test for results and reccommendations

## 2024-01-20 ENCOUNTER — Other Ambulatory Visit: Payer: Self-pay | Admitting: Physician Assistant

## 2024-01-26 ENCOUNTER — Encounter: Payer: Self-pay | Admitting: Internal Medicine

## 2024-02-21 ENCOUNTER — Other Ambulatory Visit: Payer: Self-pay | Admitting: Adult Health

## 2024-02-21 DIAGNOSIS — K219 Gastro-esophageal reflux disease without esophagitis: Secondary | ICD-10-CM

## 2024-02-27 NOTE — Progress Notes (Addendum)
 BH MD/PA/NP OP Progress Note  02/28/2024 12:38 PM Jean Nash  MRN:  [id number]  Visit Diagnosis:    ICD-10-CM   1. MDD (major depressive disorder), recurrent, in full remission (HCC)  F33.42 DULoxetine  (CYMBALTA ) 30 MG capsule    2. Fibromyalgia  M79.7      Assessment: Jean Nash is a 83 y.o. female with a history of MDD, anxiety, and fibromyalgia who presented to [facility] at [facility] for initial evaluation on 08/11/22.    At initial evaluation patient reported a history of MDD but denied any current symptoms other than increased fatigue.  She noted that the increased fatigue seemed to correlate with when the pregabalin  was increased for her fibromyalgia.  Patient denies any other psychiatric symptoms including psychosis, paranoia, delusions, SI/HI, thoughts of self-harm, or neurovegetative signs of depression.  Patient reported struggling with chronic pain symptoms as well as migraines with some benefit from the pregabalin .  She denied any benefit from the Topamax .  Treatment options were discussed and it was decided to taper off of Effexor  and start Cymbalta  for coverage of MDD and fibromyalgia symptoms.     Jean Nash presents for follow-up evaluation. Today, 02/28/24, reports increased sleep, fatigue, and worsening memory in the interim.  While there might be some potential oversedation on the Cymbalta  patient and her husband have difficulty determining for certain.  There have been a number of other ongoing changes with her medications for migraines as well.  In addition patient is suspected to have sleep apnea and needs to complete a home sleep study.  All that said we did recommend tapering Cymbalta  to 30 mg at bedtime and monitoring for sedation.  We also will refer for neuropsych testing to evaluate for dementia.  Patient plans to come in person for next visit where we will repeat MOCA.  Psychotherapeutic interventions were used during today's session.  From 11:32 AM to 11:55 AM. Therapeutic interventions included empathic listening, supportive therapy, cognitive and behavioral therapy, motivational interviewing. Used supportive interviewing techniques to provide emotional validation. Worked on cognitive reframing techniques and recommendations made for behavioral activation.  Improvement was evidenced by patient's participation and identified commitment to therapy goals.   Plan: - Decrease Cymbalta  to 30 mg QHS - Tapered off Lyrica  managed by PCP - CBC,CMP, TSH, and UA reviewed - Neuropsych testing referral for dementia - Complete sleep study - Follow up in 2 months   Chief Complaint:  Chief Complaint  Patient presents with   Follow-up   HPI: Jean Nash presents alongside her husband.  Her reports that Jean Nash has been appearing more lethargic and fatigued in the interim.  Per his reports she is sleeping roughly 12 to 13 hours a day and will often wake up eat and then take a nap shortly after.  Notably when the kids or grandkids are home patient more active and alert.  She is also active and alert when they have friends over.  Patient's husband is unable to identify if these changes occurred after the Cymbalta  increased to 60 mg but does think she may have been slightly more sedated since.  In addition to the increased sleep and fatigue patient reports that her memory has gotten much worse.  She now has difficulty remembering things from earlier in the conversation which really bothers her.  Well memory has been declining for a while this is a more sudden decrease in functioning.  They are unable to recall when but patient reports completing a memory test with her  primary care at some point that showed no concerns.  On the positive side patient was able to cook a meal for her husband for the first time in several decades.  Notably this is due to him undergoing chemotherapy and being unable to prepare dinner himself after one of his treatments.  We  discussed decreasing Cymbalta  to 30 mg and monitoring for sedation which patient was open to.  She also mentioned that she is connected with neurologist has been trying several different medications for her migraines with some success.  We reviewed the medication she is taking and which ones may also increase sedation.  Another piece that could be contributing is sleep apnea.  Notably Jean Nash had sleep apnea in the past which improved after weight loss.  Recently she has regained that weight and her husband does endorse choking episodes during the night.  Patient did have a home sleep study but reported she feels unable to wear the device.  We recommended that she persevere and retry the home sleep study as sleep apnea could be contributing to excessive sleep, fatigue, memory concerns, and headaches.  We also did discuss starting the process of neuropsych referral for dementia.  In addition to patient coming in person for next visit so we can complete some initial screens including MOCA.  We did review the importance of behavioral activation or mood and memory symptoms.  Past Psychiatric History: Patient has seen Dr. Edda Nash in the past Psychiatry admission: denies  Previous suicide attempt: denies Past trials of medication: citalopram , and Effexor .  Patient has also taken Topamax  for migraines and pregabalin  for small fiber neuropathy.  Past Medical History:  Past Medical History:  Diagnosis Date   Adenomatous colon polyp    Anxiety    PHOBIAS   Arthritis    Breast cancer (HCC) 08/08/2013   right LOQ   Cataract    Chronic insomnia    Cluster headaches    history of migraines / NONE FOR SEVERAL YRS   Depression    Diverticulosis    Fatty liver 2011   Fibromyalgia    GERD (gastroesophageal reflux disease)    H/O hiatal hernia    History of transfusion 08/30/2013   Hx of radiation therapy 10/29/13- 12/14/13   right chest wall 5040 cGy 28 sessions, right supraclavicular/axillary region 5040  cGy 28 sessions, right chest wall boost 1000 cGy 5 sessions   Hypothyroidism    Internal hemorrhoids    Irritable bowel syndrome    Kidney stone    Lymphedema    RT ARM - WEARS SLEEVE   Macular degeneration    hole/right eye   MDD (major depressive disorder)    Osteopenia    Osteopenia    Other abnormal glucose    Other and unspecified hyperlipidemia    Pain in joint, shoulder region    Pneumonia 1610,9604   Sleep apnea    USES C-PAP   Stress incontinence, female     Past Surgical History:  Procedure Laterality Date   ABDOMINAL HYSTERECTOMY     APPENDECTOMY     BILATERAL TOTAL MASTECTOMY WITH AXILLARY LYMPH NODE DISSECTION  08/30/2013   Dr Cherlynn Cornfield   BREAST CYST ASPIRATION     9 cysts   CATARACT EXTRACTION, BILATERAL  2005/2007   CHOLECYSTECTOMY     COLONOSCOPY     COLONOSCOPY WITH PROPOFOL  N/A 11/10/2023   Procedure: COLONOSCOPY WITH PROPOFOL ;  Surgeon: Normie Becton., MD;  Location: [facility];  Service: Gastroenterology;  Laterality: N/A;  CYSTOCELE REPAIR     ENDOSCOPIC MUCOSAL RESECTION N/A 11/10/2023   Procedure: ENDOSCOPIC MUCOSAL RESECTION;  Surgeon: Brice Campi Albino Alu., MD;  Location: [facility];  Service: Gastroenterology;  Laterality: N/A;   EVACUATION BREAST HEMATOMA Left 08/31/2013   Procedure: EVACUATION HEMATOMA BREAST;  Surgeon: Lockie Rima, MD;  Location: [facility];  Service: General;  Laterality: Left;   EYE SURGERY     to repair macular hole   FOOT ARTHROPLASTY     lt    GANGLION CYST EXCISION     rt foot   HEMORRHOID SURGERY     03/1993   HEMOSTASIS CLIP PLACEMENT  11/10/2023   Procedure: HEMOSTASIS CLIP PLACEMENT;  Surgeon: Normie Becton., MD;  Location: [facility];  Service: Gastroenterology;;   JOINT REPLACEMENT  03/15/11   left knee replacement   KNEE ARTHROSCOPY     /partial knee 2016/left knee 2012   MASS EXCISION  11/04/2011   Procedure: EXCISION MASS;  Surgeon: Amelie Baize., MD;  Location: [facility];  Service: Orthopedics;  Laterality: Right;  excisional biopsy right ulna mass   MASTECTOMY W/ SENTINEL NODE BIOPSY Right 08/30/2013   Procedure: RIGHT  AXILLARY SENTINEL LYMPH NODE BIOPSY; Right Axillary Node Disection;  Surgeon: Lockie Rima, MD;  Location: [facility];  Service: General;  Laterality: Right;  Right side nuc med 7:00    PARTIAL KNEE ARTHROPLASTY Right 11/03/2015   Procedure: RIGHT KNEE MEDIAL UNICOMPARTMENTAL ARTHROPLASTY ;  Surgeon: Liliane Rei, MD;  Location: [facility];  Service: Orthopedics;  Laterality: Right;   POLYPECTOMY  11/10/2023   Procedure: POLYPECTOMY;  Surgeon: Mansouraty, Albino Alu., MD;  Location: [facility];  Service: Gastroenterology;;   RECTOCELE REPAIR     SIMPLE MASTECTOMY WITH AXILLARY SENTINEL NODE BIOPSY Left 08/30/2013   Procedure: Bilateral Breast Mastectomy ;  Surgeon: Lockie Rima, MD;  Location: [facility];  Service: General;  Laterality: Left;   skin tags removed     breast, panty line, neckline   SUBMUCOSAL LIFTING INJECTION  11/10/2023   Procedure: SUBMUCOSAL LIFTING INJECTION;  Surgeon: Normie Becton., MD;  Location: [facility];  Service: Gastroenterology;;   TOE SURGERY     preventative crossover toe surg/right foot   TOE SURGERY  2009   left foot/screw  in 2nd toe   TONSILLECTOMY     UPPER GASTROINTESTINAL ENDOSCOPY      Family Psychiatric History: Denies  Family History:  Family History  Problem Relation Age of Onset   Stroke Mother        died age 56   Diabetes Mother    Breast cancer Mother 21   Breast cancer Sister 71   Brain cancer Maternal Uncle 8   Breast cancer Paternal Aunt 37   Breast cancer Paternal Aunt        dx in her 48s   Cancer Maternal Grandmother        intra-abdominal cancer   Diabetes Maternal Grandfather    Breast cancer Paternal Grandmother 63   Brain cancer Cousin 56       maternal cousin   Brain cancer Cousin 20       paternal cousin   Colon cancer Neg Hx    Colon polyps Neg Hx     Crohn's disease Neg Hx    Esophageal cancer Neg Hx    Rectal cancer Neg Hx    Stomach cancer Neg Hx    Ulcerative colitis Neg Hx    Inflammatory bowel disease Neg Hx  Liver disease Neg Hx    Pancreatic cancer Neg Hx     Social History:  Social History   Socioeconomic History   Marital status: Married    Spouse name: Hewitt Lou   Number of children: 3   Years of education: Not on file   Highest education level: GED or equivalent  Occupational History   Occupation: retired Catering manager  Tobacco Use   Smoking status: Former    Current packs/day: 0.00    Average packs/day: 0.1 packs/day for 2.0 years (0.2 ttl pk-yrs)    Types: Cigarettes    Start date: 11/16/1959    Quit date: 11/15/1960    Years since quitting: 63.3    Passive exposure: Past (over 58 years ago)   Smokeless tobacco: Never  Vaping Use   Vaping status: Never Used  Substance and Sexual Activity   Alcohol use: Yes    Comment: rarely   Drug use: No   Sexual activity: Yes    Comment: menarche age 40, fist live birth 77, P 3, hysterectomy age 53, no HRT, BCP 2 yrs  Other Topics Concern   Not on file  Social History Narrative   Occupation:  Retired Catering manager    Married with 3 grown children      Never Smoked     Alcohol use-no        Social Drivers of Corporate investment banker Strain: Low Risk  (12/22/2023)   Overall Financial Resource Strain (CARDIA)    Difficulty of Paying Living Expenses: Not hard at all  Food Insecurity: Unknown (12/22/2023)   Hunger Vital Sign    Worried About Running Out of Food in the Last Year: Never true    Ran Out of Food in the Last Year: Not on file  Transportation Needs: No Transportation Needs (02/09/2023)   PRAPARE - Administrator, Nash Service (Medical): No    Lack of Transportation (Non-Medical): No  Physical Activity: Inactive (12/22/2023)   Exercise Vital Sign    Days of Exercise per Week: 0 days    Minutes of Exercise per Session: 0 min  Stress: Stress  Concern Present (12/22/2023)   [employer] of Occupational Health - Occupational Stress Questionnaire    Feeling of Stress : To some extent  Social Connections: Unknown (12/22/2023)   Social Connection and Isolation Panel [NHANES]    Frequency of Communication with Friends and Family: Patient declined    Frequency of Social Gatherings with Friends and Family: Patient declined    Attends Religious Services: Patient declined    Database administrator or Organizations: No    Attends Engineer, structural: Not on file    Marital Status: Married    Allergies:  No Known Allergies   Current Medications: Current Outpatient Medications  Medication Sig Dispense Refill   Bacillus Coagulans-Inulin (PROBIOTIC) 1-250 BILLION-MG CAPS Take by mouth.     butalbital -acetaminophen -caffeine  (FIORICET) 50-325-40 MG tablet Take 1 tablet by mouth as needed for headache. 30 tablet 2   DULoxetine  (CYMBALTA ) 30 MG capsule Take 1 capsule (30 mg total) by mouth every evening. 90 capsule 0   Erenumab -aooe (AIMOVIG ) 140 MG/ML SOAJ Inject 140 mg into the skin every 30 (thirty) days. 1.12 mL 3   esomeprazole  (NEXIUM ) 40 MG capsule TAKE 1 CAPSULE BY MOUTH DAILY AT NOON 365 capsule 0   famotidine  (PEPCID ) 40 MG tablet TAKE 1 TABLET BY MOUTH AT BEDTIME 90 tablet 1   glucose blood (EMBRACE PRO GLUCOSE TEST) test strip  Use to check blood sugar TID 200 each 12   levothyroxine  (SYNTHROID ) 100 MCG tablet TAKE 1 TABLET BY MOUTH DAILY 365 tablet 0   nitrofurantoin , macrocrystal-monohydrate, (MACROBID ) 100 MG capsule TAKE 1 CAPSULE BY MOUTH AT BEDTIME 365 capsule 0   omega-3 acid ethyl esters (LOVAZA ) 1 g capsule TAKE 1 CAPSULE BY MOUTH TWICE A DAY 180 capsule 3   Polyethylene Glycol 3350  (MIRALAX  PO) Take by mouth. 1/2 capful every morning     Rimegepant Sulfate (NURTEC) 75 MG TBDP Take 1 tablet as needed for acute migraine attacks 8 tablet 3   No current facility-administered medications for this visit.      Musculoskeletal: Strength & Muscle Tone: within normal limits Gait & Station: normal, unsteady Patient leans: N/A  Psychiatric Specialty Exam: Review of Systems  There were no vitals taken for this visit.There is no height or weight on file to calculate BMI.  General Appearance: Well Groomed  Eye Contact:  Good  Speech:  Clear and Coherent and Normal Rate  Volume:  Normal  Mood:  Anxious and Euthymic  Affect:  Congruent  Thought Process:  Coherent and Goal Directed  Orientation:  Full (Time, Place, and Person)  Thought Content: Logical   Suicidal Thoughts:  No  Homicidal Thoughts:  No  Memory:  NA  Judgement:  Good  Insight:  Good  Psychomotor Activity:  Normal  Concentration:  Concentration: Good  Recall:  Fair  Fund of Knowledge: Fair  Language: Good  Akathisia:  NA    AIMS (if indicated): not done  Assets:  Communication Skills Desire for Improvement Housing Intimacy Leisure Time Resilience Social Support  ADL's:  Intact  Cognition: WNL  Sleep:  Good   Metabolic Disorder Labs: Lab Results  Component Value Date   HGBA1C 6.3 (A) 06/10/2023   MPG 126 (H) 08/27/2013   MPG 117 (H) 06/22/2013   Lab Results  Component Value Date   PROLACTIN 3.9 07/22/2009   Lab Results  Component Value Date   CHOL 153 03/03/2023   TRIG 161 (H) 03/03/2023   HDL 51 03/03/2023   CHOLHDL 3.0 03/03/2023   VLDL 40.9 05/25/2022   LDLCALC 75 03/03/2023   LDLCALC 133 (H) 05/25/2022   Lab Results  Component Value Date   TSH 1.150 03/03/2023   TSH 1.89 05/25/2022    Therapeutic Level Labs: No results found for: "LITHIUM" No results found for: "VALPROATE" No results found for: "CBMZ"   Screenings: GAD-7    Flowsheet Row Office Visit from 01/03/2023 in [facility] [place]  Total GAD-7 Score 7      Mini-Mental    Flowsheet Row Office Visit from 03/03/2023 in [facility] Office Visit from 02/03/2023 in [facility] [place] Office Visit from 10/27/2022 in [facility] [place]  Total Score (max 30 points ) 26 28 29       PHQ2-9    Flowsheet Row Office Visit from 03/15/2023 in [facility] [place] Clinical Support from 02/09/2023 in [facility] [place] Office Visit from 01/03/2023 in [facility] [place] Video Visit from 11/01/2022 in [facility] [place] Office Visit from [facility]  PHQ-2 Total Score 4 0 4 0 0  PHQ-9 Total Score 14 0 15 4 --      Flowsheet Row ED from 06/05/2023 in [facility] at [facility]  Office Visit from 08/11/2022 in [facility] ED from 08/08/2022 in [facility] Department at [facility]  C-SSRS RISK CATEGORY No Risk No Risk No Risk       Collaboration of Care: Collaboration of Care: Medication Management AEB medication prescription, Primary Care Provider AEB chart review, and Other provider involved in patient's care AEB GI chart review  Patient/Guardian was advised Release of Information must be obtained prior to any record release in order to collaborate their care with an outside provider. Patient/Guardian was advised if they have not already done so to contact the registration department to sign all necessary forms in order for us  to release information regarding their care.   Consent: Patient/Guardian gives verbal consent for treatment and assignment of benefits for services provided during this visit. Patient/Guardian expressed understanding and agreed to proceed.    Virtual Visit via Video Note  I connected with Jean Nash on 02/28/24 at 11:30 AM EDT by a video enabled telemedicine application and verified that I am speaking with the correct person using two  identifiers.  Location: Patient: Home Provider: Home Office   I discussed the limitations of evaluation and management by telemedicine and the availability of in person appointments. The patient expressed understanding and agreed to proceed.   I discussed the assessment and treatment plan with the patient. The patient was provided an opportunity to ask questions and all were answered. The patient agreed with the plan and demonstrated an understanding of the instructions.   The patient was advised to call back or seek an in-person evaluation if the symptoms worsen or if the condition fails to improve as anticipated.  I provided 35 minutes of non-face-to-face time during this encounter.  Yves Herb, MD 02/28/2024, 12:38 PM

## 2024-02-28 ENCOUNTER — Telehealth (HOSPITAL_COMMUNITY): Payer: Medicare HMO | Admitting: Psychiatry

## 2024-02-28 ENCOUNTER — Other Ambulatory Visit (HOSPITAL_COMMUNITY): Payer: Self-pay

## 2024-02-28 ENCOUNTER — Encounter (HOSPITAL_COMMUNITY): Payer: Self-pay | Admitting: Psychiatry

## 2024-02-28 DIAGNOSIS — M797 Fibromyalgia: Secondary | ICD-10-CM

## 2024-02-28 DIAGNOSIS — F3342 Major depressive disorder, recurrent, in full remission: Secondary | ICD-10-CM

## 2024-02-28 MED ORDER — DULOXETINE HCL 30 MG PO CPEP: 30.0000 mg | ORAL_CAPSULE | Freq: Every evening | ORAL | 0 refills | Status: DC

## 2024-02-29 ENCOUNTER — Encounter: Payer: Self-pay | Admitting: Psychology

## 2024-03-22 ENCOUNTER — Encounter

## 2024-03-22 DIAGNOSIS — G4733 Obstructive sleep apnea (adult) (pediatric): Secondary | ICD-10-CM

## 2024-04-02 ENCOUNTER — Encounter: Attending: Psychology | Admitting: Psychology

## 2024-04-02 ENCOUNTER — Other Ambulatory Visit: Payer: Self-pay | Admitting: Adult Health

## 2024-04-02 DIAGNOSIS — R4189 Other symptoms and signs involving cognitive functions and awareness: Secondary | ICD-10-CM | POA: Diagnosis present

## 2024-04-02 DIAGNOSIS — Z8669 Personal history of other diseases of the nervous system and sense organs: Secondary | ICD-10-CM

## 2024-04-03 NOTE — Telephone Encounter (Signed)
 Okay for refill?

## 2024-04-11 DIAGNOSIS — G4733 Obstructive sleep apnea (adult) (pediatric): Secondary | ICD-10-CM | POA: Diagnosis not present

## 2024-04-24 ENCOUNTER — Encounter: Payer: Self-pay | Admitting: Adult Health

## 2024-04-24 ENCOUNTER — Ambulatory Visit (INDEPENDENT_AMBULATORY_CARE_PROVIDER_SITE_OTHER): Admitting: Adult Health

## 2024-04-24 VITALS — BP 102/70 | HR 76 | Temp 98.2°F | Ht 60.0 in | Wt 181.0 lb

## 2024-04-24 DIAGNOSIS — R300 Dysuria: Secondary | ICD-10-CM | POA: Diagnosis not present

## 2024-04-24 LAB — POCT URINALYSIS DIPSTICK
Bilirubin, UA: NEGATIVE
Blood, UA: NEGATIVE
Glucose, UA: NEGATIVE
Ketones, UA: NEGATIVE
Leukocytes, UA: NEGATIVE
Nitrite, UA: NEGATIVE
Protein, UA: POSITIVE — AB
Spec Grav, UA: >=1.03 — AB (ref 1.010–1.025)
Urobilinogen, UA: 0.2 U/dL
pH, UA: 6 (ref 5.0–8.0)

## 2024-04-24 NOTE — Progress Notes (Signed)
 Subjective:    [name], female    DOB: [birth year], 83 y.o.   MRN: [id number]  HPI 83 year old female who  has a past medical history of Adenomatous colon polyp, Anxiety, Arthritis, Breast cancer (HCC) (08/08/2013), Cataract, Chronic insomnia, Cluster headaches, Depression, Diverticulosis, Fatty liver (2011), Fibromyalgia, GERD (gastroesophageal reflux disease), H/O hiatal hernia, History of transfusion (08/30/2013), radiation therapy (10/29/13- 12/14/13), Hypothyroidism, Internal hemorrhoids, Irritable bowel syndrome, Kidney stone, Lymphedema, Macular degeneration, MDD (major depressive disorder), Osteopenia, Osteopenia, Other abnormal glucose, Other and unspecified hyperlipidemia, Pain in joint, shoulder region, Pneumonia (9562,1308), Sleep apnea, and Stress incontinence, female.  She presents to the office today for concern of a UTI. She reports that her symptoms started 2 days ago  Her symptoms include dysuria and feeling lethargic. She has not had any worsening low back pain, frequency, urgency or hematuria. Has not had symptoms today    Review of Systems See HPI   Past Medical History:  Diagnosis Date   Adenomatous colon polyp    Anxiety    PHOBIAS   Arthritis    Breast cancer (HCC) 08/08/2013   right LOQ   Cataract    Chronic insomnia    Cluster headaches    history of migraines / NONE FOR SEVERAL YRS   Depression    Diverticulosis    Fatty liver 2011   Fibromyalgia    GERD (gastroesophageal reflux disease)    H/O hiatal hernia    History of transfusion 08/30/2013   Hx of radiation therapy 10/29/13- 12/14/13   right chest wall 5040 cGy 28 sessions, right supraclavicular/axillary region 5040 cGy 28 sessions, right chest wall boost 1000 cGy 5 sessions   Hypothyroidism    Internal hemorrhoids    Irritable bowel syndrome    Kidney stone    Lymphedema    RT ARM - WEARS SLEEVE   Macular degeneration    hole/right eye   MDD (major depressive disorder)     Osteopenia    Osteopenia    Other abnormal glucose    Other and unspecified hyperlipidemia    Pain in joint, shoulder region    Pneumonia 6578,4696   Sleep apnea    USES C-PAP   Stress incontinence, female     Social History   Socioeconomic History   Marital status: Married    Spouse name: Hewitt Lou   Number of children: 3   Years of education: Not on file   Highest education level: GED or equivalent  Occupational History   Occupation: retired Catering manager  Tobacco Use   Smoking status: Former    Current packs/day: 0.00    Average packs/day: 0.1 packs/day for 2.0 years (0.2 ttl pk-yrs)    Types: Cigarettes    Start date: 11/16/1959    Quit date: 11/15/1960    Years since quitting: 63.4    Passive exposure: Past (over 58 years ago)   Smokeless tobacco: Never  Vaping Use   Vaping status: Never Used  Substance and Sexual Activity   Alcohol use: Yes    Comment: rarely   Drug use: No   Sexual activity: Yes    Comment: menarche age 35, fist live birth 77, P 3, hysterectomy age 71, no HRT, BCP 2 yrs  Other Topics Concern   Not on file  Social History Narrative   Occupation:  Retired Catering manager    Married with 3 grown children      Never Smoked     Alcohol use-no  Social Drivers of Corporate investment banker Strain: Low Risk  (12/22/2023)   Overall Financial Resource Strain (CARDIA)    Difficulty of Paying Living Expenses: Not hard at all  Food Insecurity: Unknown (12/22/2023)   Hunger Vital Sign    Worried About Running Out of Food in the Last Year: Never true    Ran Out of Food in the Last Year: Not on file  Transportation Needs: No Transportation Needs (02/09/2023)   PRAPARE - Administrator, Nash Service (Medical): No    Lack of Transportation (Non-Medical): No  Physical Activity: Unknown (12/22/2023)   Exercise Vital Sign    Days of Exercise per Week: 0 days    Minutes of Exercise per Session: Not on file  Recent Concern: Physical Activity - Inactive  (12/22/2023)   Exercise Vital Sign    Days of Exercise per Week: 0 days    Minutes of Exercise per Session: 0 min  Stress: Stress Concern Present (12/22/2023)   [employer] of Occupational Health - Occupational Stress Questionnaire    Feeling of Stress : To some extent  Social Connections: Unknown (12/22/2023)   Social Connection and Isolation Panel [NHANES]    Frequency of Communication with Friends and Family: Patient declined    Frequency of Social Gatherings with Friends and Family: Patient declined    Attends Religious Services: Patient declined    Active Member of Clubs or Organizations: No    Attends Engineer, structural: Not on file    Marital Status: Married  Catering manager Violence: Not At Risk (02/09/2023)   Humiliation, Afraid, Rape, and Kick questionnaire    Fear of Current or Ex-Partner: No    Emotionally Abused: No    Physically Abused: No    Sexually Abused: No    Past Surgical History:  Procedure Laterality Date   ABDOMINAL HYSTERECTOMY     APPENDECTOMY     BILATERAL TOTAL MASTECTOMY WITH AXILLARY LYMPH NODE DISSECTION  08/30/2013   Dr Cherlynn Cornfield   BREAST CYST ASPIRATION     9 cysts   CATARACT EXTRACTION, BILATERAL  2005/2007   CHOLECYSTECTOMY     COLONOSCOPY     COLONOSCOPY WITH PROPOFOL  N/A 11/10/2023   Procedure: COLONOSCOPY WITH PROPOFOL ;  Surgeon: Normie Becton., MD;  Location: [facility];  Service: Gastroenterology;  Laterality: N/A;   CYSTOCELE REPAIR     ENDOSCOPIC MUCOSAL RESECTION N/A 11/10/2023   Procedure: ENDOSCOPIC MUCOSAL RESECTION;  Surgeon: Brice Campi Albino Alu., MD;  Location: [facility];  Service: Gastroenterology;  Laterality: N/A;   EVACUATION BREAST HEMATOMA Left 08/31/2013   Procedure: EVACUATION HEMATOMA BREAST;  Surgeon: Lockie Rima, MD;  Location: [facility];  Service: General;  Laterality: Left;   EYE SURGERY     to repair macular hole   FOOT ARTHROPLASTY     lt    GANGLION CYST EXCISION     rt foot    HEMORRHOID SURGERY     03/1993   HEMOSTASIS CLIP PLACEMENT  11/10/2023   Procedure: HEMOSTASIS CLIP PLACEMENT;  Surgeon: Normie Becton., MD;  Location: [facility];  Service: Gastroenterology;;   JOINT REPLACEMENT  03/15/11   left knee replacement   KNEE ARTHROSCOPY     /partial knee 2016/left knee 2012   MASS EXCISION  11/04/2011   Procedure: EXCISION MASS;  Surgeon: Amelie Baize., MD;  Location: [facility];  Service: Orthopedics;  Laterality: Right;  excisional biopsy right ulna mass   MASTECTOMY W/ SENTINEL NODE BIOPSY  Right 08/30/2013   Procedure: RIGHT  AXILLARY SENTINEL LYMPH NODE BIOPSY; Right Axillary Node Disection;  Surgeon: Lockie Rima, MD;  Location: [facility];  Service: General;  Laterality: Right;  Right side nuc med 7:00    PARTIAL KNEE ARTHROPLASTY Right 11/03/2015   Procedure: RIGHT KNEE MEDIAL UNICOMPARTMENTAL ARTHROPLASTY ;  Surgeon: Liliane Rei, MD;  Location: [facility];  Service: Orthopedics;  Laterality: Right;   POLYPECTOMY  11/10/2023   Procedure: POLYPECTOMY;  Surgeon: Mansouraty, Albino Alu., MD;  Location: [facility];  Service: Gastroenterology;;   RECTOCELE REPAIR     SIMPLE MASTECTOMY WITH AXILLARY SENTINEL NODE BIOPSY Left 08/30/2013   Procedure: Bilateral Breast Mastectomy ;  Surgeon: Lockie Rima, MD;  Location: [facility];  Service: General;  Laterality: Left;   skin tags removed     breast, panty line, neckline   SUBMUCOSAL LIFTING INJECTION  11/10/2023   Procedure: SUBMUCOSAL LIFTING INJECTION;  Surgeon: Normie Becton., MD;  Location: [facility];  Service: Gastroenterology;;   TOE SURGERY     preventative crossover toe surg/right foot   TOE SURGERY  2009   left foot/screw  in 2nd toe   TONSILLECTOMY     UPPER GASTROINTESTINAL ENDOSCOPY      Family History  Problem Relation Age of Onset   Stroke Mother        died age 66   Diabetes Mother    Breast cancer Mother 50   Breast cancer Sister 27   Brain cancer  Maternal Uncle 8   Breast cancer Paternal Aunt 7   Breast cancer Paternal Aunt        dx in her 42s   Cancer Maternal Grandmother        intra-abdominal cancer   Diabetes Maternal Grandfather    Breast cancer Paternal Grandmother 52   Brain cancer Cousin 34       maternal cousin   Brain cancer Cousin 20       paternal cousin   Colon cancer Neg Hx    Colon polyps Neg Hx    Crohn's disease Neg Hx    Esophageal cancer Neg Hx    Rectal cancer Neg Hx    Stomach cancer Neg Hx    Ulcerative colitis Neg Hx    Inflammatory bowel disease Neg Hx    Liver disease Neg Hx    Pancreatic cancer Neg Hx     No Known Allergies  Current Outpatient Medications on File Prior to Visit  Medication Sig Dispense Refill   Bacillus Coagulans-Inulin (PROBIOTIC) 1-250 BILLION-MG CAPS Take by mouth.     butalbital -acetaminophen -caffeine  (FIORICET) 50-325-40 MG tablet TAKE 1 TABLET BY MOUTH DAILY AS NEEDED FOR HEADACHE 30 tablet 0   DULoxetine  (CYMBALTA ) 30 MG capsule Take 1 capsule (30 mg total) by mouth every evening. 90 capsule 0   Erenumab -aooe (AIMOVIG ) 140 MG/ML SOAJ Inject 140 mg into the skin every 30 (thirty) days. 1.12 mL 3   esomeprazole  (NEXIUM ) 40 MG capsule TAKE 1 CAPSULE BY MOUTH DAILY AT NOON 365 capsule 0   famotidine  (PEPCID ) 40 MG tablet TAKE 1 TABLET BY MOUTH AT BEDTIME 90 tablet 1   glucose blood (EMBRACE PRO GLUCOSE TEST) test strip Use to check blood sugar TID 200 each 12   levothyroxine  (SYNTHROID ) 100 MCG tablet TAKE 1 TABLET BY MOUTH DAILY 365 tablet 0   nitrofurantoin , macrocrystal-monohydrate, (MACROBID ) 100 MG capsule TAKE 1 CAPSULE BY MOUTH AT BEDTIME 365 capsule 0   omega-3 acid ethyl esters (LOVAZA ) 1 g capsule TAKE  1 CAPSULE BY MOUTH TWICE A DAY 180 capsule 3   Polyethylene Glycol 3350  (MIRALAX  PO) Take by mouth. 1/2 capful every morning     Rimegepant Sulfate (NURTEC) 75 MG TBDP Take 1 tablet as needed for acute migraine attacks 8 tablet 3   No current  facility-administered medications on file prior to visit.    BP 102/70   Pulse 76   Temp 98.2 F (36.8 C) (Oral)   Ht 5' (1.524 m)   Wt 181 lb (82.1 kg)   SpO2 96%   BMI 35.35 kg/m       Objective:   Physical Exam Vitals and nursing note reviewed.  Constitutional:      Appearance: Normal appearance.  Cardiovascular:     Rate and Rhythm: Normal rate and regular rhythm.     Pulses: Normal pulses.     Heart sounds: Normal heart sounds.  Pulmonary:     Effort: Pulmonary effort is normal.     Breath sounds: Normal breath sounds.  Abdominal:     General: Abdomen is flat. There is no distension.     Palpations: Abdomen is soft.     Tenderness: There is no abdominal tenderness. There is no right CVA tenderness or left CVA tenderness.  Musculoskeletal:        General: Normal range of motion.  Skin:    General: Skin is warm and dry.     Capillary Refill: Capillary refill takes less than 2 seconds.  Neurological:     General: No focal deficit present.     Mental Status: She is alert and oriented to person, place, and time.  Psychiatric:        Mood and Affect: Mood normal.        Behavior: Behavior normal.        Thought Content: Thought content normal.        Judgment: Judgment normal.        Assessment & Plan:  1. Dysuria (Primary)  - POC Urinalysis Dipstick- negative leuks and nitrites. Will send culture to confirm. Stay hydrated  - Culture, Urine; Future  Alto Atta, NP

## 2024-04-25 LAB — URINE CULTURE
MICRO NUMBER:: 16561663
SPECIMEN QUALITY:: ADEQUATE

## 2024-04-26 ENCOUNTER — Ambulatory Visit: Payer: Self-pay | Admitting: Adult Health

## 2024-04-27 ENCOUNTER — Telehealth: Payer: Self-pay

## 2024-04-27 DIAGNOSIS — G4733 Obstructive sleep apnea (adult) (pediatric): Secondary | ICD-10-CM

## 2024-04-27 NOTE — Telephone Encounter (Signed)
 Patient husband, Salena Craven calling (with patient) wanting to know results of sleep study.  Stating patients sleep is getting worse every night.  Please have Dr. Linder Revere review and let patient know what recommendations are.

## 2024-04-27 NOTE — Telephone Encounter (Signed)
 Order- refer to [facility], Dr Larkin Plumb, patient interested in [place] for OSA

## 2024-04-27 NOTE — Telephone Encounter (Signed)
 Her home sleep test showed severe obstructive sleep apnea, stopping 33 times/ hour with drops in blood oxygen level.  I recommend we order new DME, new CPAP auto 5-20, mask of choice,  heated humidifier, supplies, AirView/ card.  She will need return ov in 31-90 days

## 2024-04-27 NOTE — Telephone Encounter (Signed)
 Called patient.  Spoke with her husband.  He states patient has a CPAP at home.  He is not sure if it is 81 years old but he would like to try to get patient started on the [facility] Device.  Will route information back to Dr. Linder Revere for review.  Patient and her husband aware this will be addressed next week when Dr. Linder Revere is back in the clinic.

## 2024-04-27 NOTE — Telephone Encounter (Signed)
 Copied from CRM [id number]. Topic: Clinical - Lab/Test Results >> Apr 27, 2024 12:35 PM Jean Nash wrote: Reason for CRM: Patient's husband calling upset.  State his wife had a sleep study three weeks ago and they have not heard anything. He wants to talk to Dr Antonette Batters office    Already been address [email or web address]

## 2024-04-30 NOTE — Telephone Encounter (Signed)
 Called and spoke with both patient and her husband, Jean Nash. (Both were on the phone together).  Gave information per Dr. Linder Revere.  Patient in agreement for referral to [facility]NT for [facility] consult.  Will refer to Dr. Soldatova this encounter.

## 2024-04-30 NOTE — Progress Notes (Signed)
 BH MD/PA/NP OP Progress Note  05/01/2024 12:57 PM Chantille Navarrete  MRN:  [id number]  Visit Diagnosis:    ICD-10-CM   1. MDD (major depressive disorder), recurrent, in full remission (HCC)  F33.42 DULoxetine  (CYMBALTA ) 30 MG capsule    2. Fibromyalgia  M79.7      Assessment: Rosanna Bickle is a 83 y.o. female with a history of MDD, anxiety, and fibromyalgia who presented to [facility] at [facility] for initial evaluation on 08/11/22.    At initial evaluation patient reported a history of MDD but denied any current symptoms other than increased fatigue.  She noted that the increased fatigue seemed to correlate with when the pregabalin  was increased for her fibromyalgia.  Patient denies any other psychiatric symptoms including psychosis, paranoia, delusions, SI/HI, thoughts of self-harm, or neurovegetative signs of depression.  Patient reported struggling with chronic pain symptoms as well as migraines with some benefit from the pregabalin .  She denied any benefit from the Topamax .  Treatment options were discussed and it was decided to taper off of Effexor  and start Cymbalta  for coverage of MDD and fibromyalgia symptoms.     Carrie Civil presents for follow-up evaluation. Today, 05/01/24, mood wise patient has been stable.  She still struggles with excessive fatigue, sleep, memory concerns, and amotivation.  She has done a bit better with the behavioral activation in the interim.  Notably patient completed sleep study and does have severe sleep apnea.  She is not on CPAP and was referred to ENT to look into the inspire procedure.  We did discuss dental devices today as well.  Patient can discuss this further with her pulmonologist if desired.  The symptoms described could all be related to her untreated severe sleep apnea.  That being the case patient would benefit most from treating this.  We will continue on her current regimen and follow up in 3 months.  Patient did complete  MoCA today and scored a 25 out of 30.  She had deficiencies in delayed recall and orientation.  Neuropsych testing referral was placed.  Psychotherapeutic interventions were used during today's session. From 11:36 AM to 11:59 AM. Therapeutic interventions included empathic listening, supportive therapy, cognitive and behavioral therapy, motivational interviewing. Used supportive interviewing techniques to provide emotional validation. Worked on cognitive reframing techniques and recommendations made for behavioral activation.  Improvement was evidenced by patient's participation and identified commitment to therapy goals.   Plan: - Continue Cymbalta  30 mg at bedtime - CBC,CMP, TSH, and UA reviewed - Neuropsych testing referral for dementia, MOCA 25 - Complete sleep study - Follow up in 2 months   Chief Complaint:  Chief Complaint  Patient presents with   Follow-up   HPI: Yaqueline presents alongside her husband.  She reports that the last several months have been a struggle for her.  She has been struggling with several somatic symptoms including worsening headaches that have been constant for the past month and a half.  She also still struggles with the fatigue, excessive sleep, and a newer onset of diarrhea.  Of note patient is connected with a headache specialist for her migraines.  She also recently had sleep study on which was positive for severe sleep apnea.  Patient had been diagnosed with sleep apnea in the past and could not tolerate CPAP.  She has been referred to an ENT to discuss the inspire procedure.  We also reviewed dental devices the patient could consider if the inspire procedure is not an option.  As  for the GI concerns patient's husband reports that she has an active stomach ulcer which had been untreated due to lack of symptoms previously.  They have stopped her MiraLAX  a couple days ago and patient has not yet reached out to the GI doctor.  Mood wise patient reports feelings of  guilt about her decreased activity.  She has been pushing herself to go to the pool for about an hour a day.  Outside of that however she spends most of her time sleeping.  We reviewed the severe sleep apnea and how it could contribute to the migraines, fatigue, low mood, and memory concerns.  We recommended that she pursue treatment for the sleep apnea to better clarify if there is underlying depression.  We also did complete MoCA assessment the patient today and she scored a 25 out of 30.  Her primary area of difficulty was with delayed recall and partially with orientation.  Referral was placed for neuropsych testing.  Past Psychiatric History: Patient has seen Dr. Edda Goo in the past Psychiatry admission: denies  Previous suicide attempt: denies Past trials of medication: citalopram , and Effexor .  Patient has also taken Topamax  for migraines and pregabalin  for small fiber neuropathy.  Past Medical History:  Past Medical History:  Diagnosis Date   Adenomatous colon polyp    Anxiety    PHOBIAS   Arthritis    Breast cancer (HCC) 08/08/2013   right LOQ   Cataract    Chronic insomnia    Cluster headaches    history of migraines / NONE FOR SEVERAL YRS   Depression    Diverticulosis    Fatty liver 2011   Fibromyalgia    GERD (gastroesophageal reflux disease)    H/O hiatal hernia    History of transfusion 08/30/2013   Hx of radiation therapy 10/29/13- 12/14/13   right chest wall 5040 cGy 28 sessions, right supraclavicular/axillary region 5040 cGy 28 sessions, right chest wall boost 1000 cGy 5 sessions   Hypothyroidism    Internal hemorrhoids    Irritable bowel syndrome    Kidney stone    Lymphedema    RT ARM - WEARS SLEEVE   Macular degeneration    hole/right eye   MDD (major depressive disorder)    Osteopenia    Osteopenia    Other abnormal glucose    Other and unspecified hyperlipidemia    Pain in joint, shoulder region    Pneumonia 4540,9811   Sleep apnea    USES C-PAP    Stress incontinence, female     Past Surgical History:  Procedure Laterality Date   ABDOMINAL HYSTERECTOMY     APPENDECTOMY     BILATERAL TOTAL MASTECTOMY WITH AXILLARY LYMPH NODE DISSECTION  08/30/2013   Dr Cherlynn Cornfield   BREAST CYST ASPIRATION     9 cysts   CATARACT EXTRACTION, BILATERAL  2005/2007   CHOLECYSTECTOMY     COLONOSCOPY     COLONOSCOPY WITH PROPOFOL  N/A 11/10/2023   Procedure: COLONOSCOPY WITH PROPOFOL ;  Surgeon: Normie Becton., MD;  Location: [facility];  Service: Gastroenterology;  Laterality: N/A;   CYSTOCELE REPAIR     ENDOSCOPIC MUCOSAL RESECTION N/A 11/10/2023   Procedure: ENDOSCOPIC MUCOSAL RESECTION;  Surgeon: Brice Campi Albino Alu., MD;  Location: [facility];  Service: Gastroenterology;  Laterality: N/A;   EVACUATION BREAST HEMATOMA Left 08/31/2013   Procedure: EVACUATION HEMATOMA BREAST;  Surgeon: Lockie Rima, MD;  Location: [facility];  Service: General;  Laterality: Left;   EYE SURGERY     to repair  macular hole   FOOT ARTHROPLASTY     lt    GANGLION CYST EXCISION     rt foot   HEMORRHOID SURGERY     03/1993   HEMOSTASIS CLIP PLACEMENT  11/10/2023   Procedure: HEMOSTASIS CLIP PLACEMENT;  Surgeon: Normie Becton., MD;  Location: [facility];  Service: Gastroenterology;;   JOINT REPLACEMENT  03/15/11   left knee replacement   KNEE ARTHROSCOPY     /partial knee 2016/left knee 2012   MASS EXCISION  11/04/2011   Procedure: EXCISION MASS;  Surgeon: Amelie Baize., MD;  Location: [facility];  Service: Orthopedics;  Laterality: Right;  excisional biopsy right ulna mass   MASTECTOMY W/ SENTINEL NODE BIOPSY Right 08/30/2013   Procedure: RIGHT  AXILLARY SENTINEL LYMPH NODE BIOPSY; Right Axillary Node Disection;  Surgeon: Lockie Rima, MD;  Location: [facility];  Service: General;  Laterality: Right;  Right side nuc med 7:00    PARTIAL KNEE ARTHROPLASTY Right 11/03/2015   Procedure: RIGHT KNEE MEDIAL UNICOMPARTMENTAL ARTHROPLASTY ;   Surgeon: Liliane Rei, MD;  Location: [facility];  Service: Orthopedics;  Laterality: Right;   POLYPECTOMY  11/10/2023   Procedure: POLYPECTOMY;  Surgeon: Mansouraty, Albino Alu., MD;  Location: [facility];  Service: Gastroenterology;;   RECTOCELE REPAIR     SIMPLE MASTECTOMY WITH AXILLARY SENTINEL NODE BIOPSY Left 08/30/2013   Procedure: Bilateral Breast Mastectomy ;  Surgeon: Lockie Rima, MD;  Location: [facility];  Service: General;  Laterality: Left;   skin tags removed     breast, panty line, neckline   SUBMUCOSAL LIFTING INJECTION  11/10/2023   Procedure: SUBMUCOSAL LIFTING INJECTION;  Surgeon: Normie Becton., MD;  Location: [facility];  Service: Gastroenterology;;   TOE SURGERY     preventative crossover toe surg/right foot   TOE SURGERY  2009   left foot/screw  in 2nd toe   TONSILLECTOMY     UPPER GASTROINTESTINAL ENDOSCOPY      Family Psychiatric History: Denies  Family History:  Family History  Problem Relation Age of Onset   Stroke Mother        died age 69   Diabetes Mother    Breast cancer Mother 25   Breast cancer Sister 74   Brain cancer Maternal Uncle 8   Breast cancer Paternal Aunt 39   Breast cancer Paternal Aunt        dx in her 16s   Cancer Maternal Grandmother        intra-abdominal cancer   Diabetes Maternal Grandfather    Breast cancer Paternal Grandmother 24   Brain cancer Cousin 87       maternal cousin   Brain cancer Cousin 20       paternal cousin   Colon cancer Neg Hx    Colon polyps Neg Hx    Crohn's disease Neg Hx    Esophageal cancer Neg Hx    Rectal cancer Neg Hx    Stomach cancer Neg Hx    Ulcerative colitis Neg Hx    Inflammatory bowel disease Neg Hx    Liver disease Neg Hx    Pancreatic cancer Neg Hx     Social History:  Social History   Socioeconomic History   Marital status: Married    Spouse name: Hewitt Lou   Number of children: 3   Years of education: Not on file   Highest education level: GED or equivalent   Occupational History   Occupation: retired Catering manager  Tobacco Use  Smoking status: Former    Current packs/day: 0.00    Average packs/day: 0.1 packs/day for 2.0 years (0.2 ttl pk-yrs)    Types: Cigarettes    Start date: 11/16/1959    Quit date: 11/15/1960    Years since quitting: 63.5    Passive exposure: Past (over 58 years ago)   Smokeless tobacco: Never  Vaping Use   Vaping status: Never Used  Substance and Sexual Activity   Alcohol use: Yes    Comment: rarely   Drug use: No   Sexual activity: Yes    Comment: menarche age 38, fist live birth 49, P 3, hysterectomy age 27, no HRT, BCP 2 yrs  Other Topics Concern   Not on file  Social History Narrative   Occupation:  Retired Catering manager    Married with 3 grown children      Never Smoked     Alcohol use-no        Social Drivers of Corporate investment banker Strain: Low Risk  (12/22/2023)   Overall Financial Resource Strain (CARDIA)    Difficulty of Paying Living Expenses: Not hard at all  Food Insecurity: Unknown (12/22/2023)   Hunger Vital Sign    Worried About Running Out of Food in the Last Year: Never true    Ran Out of Food in the Last Year: Not on file  Transportation Needs: No Transportation Needs (02/09/2023)   PRAPARE - Administrator, Civil Service (Medical): No    Lack of Transportation (Non-Medical): No  Physical Activity: Unknown (12/22/2023)   Exercise Vital Sign    Days of Exercise per Week: 0 days    Minutes of Exercise per Session: Not on file  Recent Concern: Physical Activity - Inactive (12/22/2023)   Exercise Vital Sign    Days of Exercise per Week: 0 days    Minutes of Exercise per Session: 0 min  Stress: Stress Concern Present (12/22/2023)   [employer] of Occupational Health - Occupational Stress Questionnaire    Feeling of Stress : To some extent  Social Connections: Unknown (12/22/2023)   Social Connection and Isolation Panel    Frequency of Communication with Friends and Family:  Patient declined    Frequency of Social Gatherings with Friends and Family: Patient declined    Attends Religious Services: Patient declined    Database administrator or Organizations: No    Attends Engineer, structural: Not on file    Marital Status: Married    Allergies:  No Known Allergies   Current Medications: Current Outpatient Medications  Medication Sig Dispense Refill   Bacillus Coagulans-Inulin (PROBIOTIC) 1-250 BILLION-MG CAPS Take by mouth.     butalbital -acetaminophen -caffeine  (FIORICET) 50-325-40 MG tablet TAKE 1 TABLET BY MOUTH DAILY AS NEEDED FOR HEADACHE 30 tablet 0   DULoxetine  (CYMBALTA ) 30 MG capsule Take 1 capsule (30 mg total) by mouth every evening. 90 capsule 0   Erenumab -aooe (AIMOVIG ) 140 MG/ML SOAJ Inject 140 mg into the skin every 30 (thirty) days. 1.12 mL 3   esomeprazole  (NEXIUM ) 40 MG capsule TAKE 1 CAPSULE BY MOUTH DAILY AT NOON 365 capsule 0   famotidine  (PEPCID ) 40 MG tablet TAKE 1 TABLET BY MOUTH AT BEDTIME 90 tablet 1   glucose blood (EMBRACE PRO GLUCOSE TEST) test strip Use to check blood sugar TID 200 each 12   levothyroxine  (SYNTHROID ) 100 MCG tablet TAKE 1 TABLET BY MOUTH DAILY 365 tablet 0   nitrofurantoin , macrocrystal-monohydrate, (MACROBID ) 100 MG capsule TAKE 1 CAPSULE  BY MOUTH AT BEDTIME 365 capsule 0   omega-3 acid ethyl esters (LOVAZA ) 1 g capsule TAKE 1 CAPSULE BY MOUTH TWICE A DAY 180 capsule 3   Polyethylene Glycol 3350  (MIRALAX  PO) Take by mouth. 1/2 capful every morning     Rimegepant Sulfate (NURTEC) 75 MG TBDP Take 1 tablet as needed for acute migraine attacks 8 tablet 3   No current facility-administered medications for this visit.     Musculoskeletal: Strength & Muscle Tone: within normal limits Gait & Station: normal, unsteady Patient leans: N/A  Psychiatric Specialty Exam: Review of Systems  There were no vitals taken for this visit.There is no height or weight on file to calculate BMI.  General Appearance:  Well Groomed  Eye Contact:  Good  Speech:  Clear and Coherent and Normal Rate  Volume:  Normal  Mood:  Euthymic and dysthymic  Affect:  Congruent  Thought Process:  Coherent and Goal Directed  Orientation:  Full (Time, Place, and Person)  Thought Content: Logical   Suicidal Thoughts:  No  Homicidal Thoughts:  No  Memory:  NA  Judgement:  Fair  Insight:  Fair  Psychomotor Activity:  Normal  Concentration:  Concentration: Good  Recall:  Poor  Fund of Knowledge: Fair  Language: Good  Akathisia:  NA    AIMS (if indicated): not done  Assets:  Communication Skills Desire for Improvement Housing Intimacy Leisure Time Resilience Social Support  ADL's:  Intact  Cognition: WNL  Sleep:  Good   Metabolic Disorder Labs: Lab Results  Component Value Date   HGBA1C 6.3 (A) 06/10/2023   MPG 126 (H) 08/27/2013   MPG 117 (H) 06/22/2013   Lab Results  Component Value Date   PROLACTIN 3.9 07/22/2009   Lab Results  Component Value Date   CHOL 153 03/03/2023   TRIG 161 (H) 03/03/2023   HDL 51 03/03/2023   CHOLHDL 3.0 03/03/2023   VLDL 16.1 05/25/2022   LDLCALC 75 03/03/2023   LDLCALC 133 (H) 05/25/2022   Lab Results  Component Value Date   TSH 1.150 03/03/2023   TSH 1.89 05/25/2022    Therapeutic Level Labs: No results found for: LITHIUM No results found for: VALPROATE No results found for: CBMZ   Screenings: GAD-7    Flowsheet Row Office Visit from 01/03/2023 in [facility] [place]  Total GAD-7 Score 7   Mini-Mental    Flowsheet Row Office Visit from 03/03/2023 in [facility] Office Visit from 02/03/2023 in [facility] [place] Office Visit from 10/27/2022 in [facility] [place]  Total Score (max 30 points ) 26 28 29    PHQ2-9    Flowsheet Row Office Visit from 03/15/2023 in [facility] [place] Clinical Support from 02/09/2023  in [facility] [place] Office Visit from 01/03/2023 in [facility] [place] Video Visit from 11[facility]01/2022 in [facility] [place] Office Visit from [facility]  PHQ-2 Total Score 4 0 4 0 0  PHQ-9 Total Score 14 0 15 4 --   Flowsheet Row ED from 06/05/2023 in [facility] at [facility] Visit from 08/11/2022 in [facility] ED from 08/08/2022 in [facility] at [facility]  C-SSRS RISK CATEGORY No Risk No Risk No Risk    Collaboration of Care: Collaboration of Care: Medication Management AEB medication prescription,  Primary Care Provider AEB chart review, and Other provider involved in patient's care AEB GI chart review  Patient/Guardian was advised Release of Information must be obtained prior to any record release in order to collaborate their care with an outside provider. Patient/Guardian was advised if they have not already done so to contact the registration department to sign all necessary forms in order for us  to release information regarding their care.   Consent: Patient/Guardian gives verbal consent for treatment and assignment of benefits for services provided during this visit. Patient/Guardian expressed understanding and agreed to proceed.    Yves Herb, MD 05/01/2024, 12:57 PM

## 2024-05-01 ENCOUNTER — Encounter (INDEPENDENT_AMBULATORY_CARE_PROVIDER_SITE_OTHER): Payer: Self-pay

## 2024-05-01 ENCOUNTER — Ambulatory Visit (HOSPITAL_BASED_OUTPATIENT_CLINIC_OR_DEPARTMENT_OTHER): Admitting: Psychiatry

## 2024-05-01 ENCOUNTER — Encounter (HOSPITAL_COMMUNITY): Payer: Self-pay | Admitting: Psychiatry

## 2024-05-01 DIAGNOSIS — M797 Fibromyalgia: Secondary | ICD-10-CM | POA: Diagnosis not present

## 2024-05-01 DIAGNOSIS — F3342 Major depressive disorder, recurrent, in full remission: Secondary | ICD-10-CM

## 2024-05-01 MED ORDER — DULOXETINE HCL 30 MG PO CPEP: 30.0000 mg | ORAL_CAPSULE | Freq: Every evening | ORAL | 0 refills | Status: DC

## 2024-05-17 ENCOUNTER — Telehealth: Payer: Self-pay | Admitting: Gastroenterology

## 2024-05-17 NOTE — Telephone Encounter (Signed)
 The pt has been advised that Dr Wilhelmenia is not the appropriate practice for sleep apnea. They will call PCP/pulmonology

## 2024-05-17 NOTE — Telephone Encounter (Signed)
 Inbound call from patients husband Ubaldo. He is calling because his wife has sleep apnea and he was told by her doctor that they needed to reach out to Dr. Wilhelmenia to see if she qualified for [facility]. Please advise.

## 2024-05-20 ENCOUNTER — Other Ambulatory Visit: Payer: Self-pay | Admitting: Adult Health

## 2024-05-22 ENCOUNTER — Telehealth: Payer: Self-pay

## 2024-05-22 NOTE — Telephone Encounter (Signed)
 Copied from CRM [id number]. Topic: Referral - Question >> May 17, 2024  9:21 AM Isabell A wrote: Reason for CRM: Patients spouse states the patient was supposed to be referred to another provider in regard to her sleep apnea/headaches.   Callback number: [phone]     Called patient.  Patient was referred to Dr. Soldatova at [facility] but has not received a call from them.  Gave patient their phone number.  Will notify our [facility] group to check on status of the referral.  [facility] group, please advise on referral to Dr. Soldatova.  Thank you

## 2024-05-24 ENCOUNTER — Ambulatory Visit (INDEPENDENT_AMBULATORY_CARE_PROVIDER_SITE_OTHER): Admitting: Otolaryngology

## 2024-05-24 VITALS — BP 129/76 | HR 91 | Wt 182.2 lb

## 2024-05-24 DIAGNOSIS — Z91198 Patient's noncompliance with other medical treatment and regimen for other reason: Secondary | ICD-10-CM | POA: Diagnosis not present

## 2024-05-24 DIAGNOSIS — Z789 Other specified health status: Secondary | ICD-10-CM

## 2024-05-24 DIAGNOSIS — G4733 Obstructive sleep apnea (adult) (pediatric): Secondary | ICD-10-CM

## 2024-05-24 NOTE — Progress Notes (Signed)
 ENT CONSULT:  Reason for Consult: OSA CPAP intolerance   HPI: Discussed the use of AI scribe software for clinical note transcription with the patient, who gave verbal consent to proceed.  History of Present Illness Jean Nash is an 83 year old female with long-standing severe sleep apnea who presents with CPAP intolerance. She is accompanied by her husband.  She initially used a CPAP machine successfully, which coincided with significant weight loss. However, after discontinuing the CPAP due to weight loss, she regained the weight and has not resumed CPAP use. She reports discomfort with pressure on her face and poor quality of sleep with CPAP mask in place.   No history of strokes or heart attacks. She is not currently on blood thinners.No insomnia hx. Here to discuss Inspire.     Records Reviewed:  [name] Office Visit for OSA 01/19/24 female nonsmoker followed for OSA, insomnia, complicated by hypothyroid, asthma, GERD, psoriasis, CA L breast/ biMastectomy  Home Sleep Test 03/06/2015- severe obstructive sleep apnea/hypopnea syndrome, AHI 44.9 per hour with desaturation to 74% and mean saturation only 89% on room air. Body weight 190 pounds   Epworth 4/24    Past Medical History:  Diagnosis Date   Adenomatous colon polyp    Anxiety    PHOBIAS   Arthritis    Breast cancer (HCC) 08/08/2013   right LOQ   Cataract    Chronic insomnia    Cluster headaches    history of migraines / NONE FOR SEVERAL YRS   Depression    Diverticulosis    Fatty liver 2011   Fibromyalgia    GERD (gastroesophageal reflux disease)    H/O hiatal hernia    History of transfusion 08/30/2013   Hx of radiation therapy 10/29/13- 12/14/13   right chest wall 5040 cGy 28 sessions, right supraclavicular/axillary region 5040 cGy 28 sessions, right chest wall boost 1000 cGy 5 sessions   Hypothyroidism    Internal hemorrhoids    Irritable bowel syndrome    Kidney stone    Lymphedema    RT ARM - WEARS  SLEEVE   Macular degeneration    hole/right eye   MDD (major depressive disorder)    Osteopenia    Osteopenia    Other abnormal glucose    Other and unspecified hyperlipidemia    Pain in joint, shoulder region    Pneumonia 8032,7990   Sleep apnea    USES C-PAP   Stress incontinence, female     Past Surgical History:  Procedure Laterality Date   ABDOMINAL HYSTERECTOMY     APPENDECTOMY     BILATERAL TOTAL MASTECTOMY WITH AXILLARY LYMPH NODE DISSECTION  08/30/2013   Dr ARON   BREAST CYST ASPIRATION     9 cysts   CATARACT EXTRACTION, BILATERAL  2005/2007   CHOLECYSTECTOMY     COLONOSCOPY     COLONOSCOPY WITH PROPOFOL  N/A 11/10/2023   Procedure: COLONOSCOPY WITH PROPOFOL ;  Surgeon: Wilhelmenia Aloha Raddle., MD;  Location: [facility];  Service: Gastroenterology;  Laterality: N/A;   CYSTOCELE REPAIR     ENDOSCOPIC MUCOSAL RESECTION N/A 11/10/2023   Procedure: ENDOSCOPIC MUCOSAL RESECTION;  Surgeon: Wilhelmenia Aloha Raddle., MD;  Location: [facility];  Service: Gastroenterology;  Laterality: N/A;   EVACUATION BREAST HEMATOMA Left 08/31/2013   Procedure: EVACUATION HEMATOMA BREAST;  Surgeon: Jina ARON, MD;  [facility]: [facility];  Service: General;  Laterality: Left;   EYE SURGERY     to repair macular hole   FOOT ARTHROPLASTY     lt  GANGLION CYST EXCISION     rt foot   HEMORRHOID SURGERY     03/1993   HEMOSTASIS CLIP PLACEMENT  11/10/2023   Procedure: HEMOSTASIS CLIP PLACEMENT;  Surgeon: Wilhelmenia Aloha Raddle., MD;  Location: [facility];  Service: Gastroenterology;;   JOINT REPLACEMENT  03/15/11   left knee replacement   KNEE ARTHROSCOPY     /partial knee 2016/left knee 2012   MASS EXCISION  11/04/2011   Procedure: EXCISION MASS;  Surgeon: Lamar LULLA Leonor Raddle., MD;  Location: [facility];  Service: Orthopedics;  Laterality: Right;  excisional biopsy right ulna mass   MASTECTOMY W/ SENTINEL NODE BIOPSY Right 08/30/2013   Procedure: RIGHT  AXILLARY SENTINEL  LYMPH NODE BIOPSY; Right Axillary Node Disection;  Surgeon: Jina Nephew, MD;  [facility];  Service: General;  Laterality: Right;  Right side nuc med 7:00    PARTIAL KNEE ARTHROPLASTY Right 11/03/2015   Procedure: RIGHT KNEE MEDIAL UNICOMPARTMENTAL ARTHROPLASTY ;  Surgeon: Dempsey Moan, MD;  Location: [facility];  Service: Orthopedics;  Laterality: Right;   POLYPECTOMY  11/10/2023   Procedure: POLYPECTOMY;  Surgeon: Mansouraty, Aloha Raddle., MD;  Location: [facility];  Service: Gastroenterology;;   RECTOCELE REPAIR     SIMPLE MASTECTOMY WITH AXILLARY SENTINEL NODE BIOPSY Left 08/30/2013   Procedure: Bilateral Breast Mastectomy ;  Surgeon: Jina Nephew, MD;  [facility];  Service: General;  Laterality: Left;   skin tags removed     breast, panty line, neckline   SUBMUCOSAL LIFTING INJECTION  11/10/2023   Procedure: SUBMUCOSAL LIFTING INJECTION;  Surgeon: Wilhelmenia Aloha Raddle., MD;  Location: [facility];  Service: Gastroenterology;;   TOE SURGERY     preventative crossover toe surg/right foot   TOE SURGERY  2009   left foot/screw  in 2nd toe   TONSILLECTOMY     UPPER GASTROINTESTINAL ENDOSCOPY      Family History  Problem Relation Age of Onset   Stroke Mother        died age 54   Diabetes Mother    Breast cancer Mother 74   Breast cancer Sister 52   Brain cancer Maternal Uncle 8   Breast cancer Paternal Aunt 67   Breast cancer Paternal Aunt        dx in her 108s   Cancer Maternal Grandmother        intra-abdominal cancer   Diabetes Maternal Grandfather    Breast cancer Paternal Grandmother 69   Brain cancer Cousin 53       maternal cousin   Brain cancer Cousin 20       paternal cousin   Colon cancer Neg Hx    Colon polyps Neg Hx    Crohn's disease Neg Hx    Esophageal cancer Neg Hx    Rectal cancer Neg Hx    Stomach cancer Neg Hx    Ulcerative colitis Neg Hx    Inflammatory bowel disease Neg Hx    Liver disease Neg Hx    Pancreatic cancer Neg Hx     Social  History:  reports that she quit smoking about 63 years ago. Her smoking use included cigarettes. She started smoking about 64 years ago. She has a 0.2 pack-year smoking history. She has been exposed to tobacco smoke. She has never used smokeless tobacco. She reports current alcohol use. She reports that she does not use drugs.  Allergies: No Known Allergies  Medications: I have reviewed the patient's current medications.  The PMH, PSH, Medications, Allergies,  and SH were reviewed and updated.  ROS: Constitutional: Negative for fever, weight loss and weight gain. Cardiovascular: Negative for chest pain and dyspnea on exertion. Respiratory: Is not experiencing shortness of breath at rest. Gastrointestinal: Negative for nausea and vomiting. Neurological: Negative for headaches. Psychiatric: The patient is not nervous/anxious  Blood pressure 129/76, pulse 91, weight 182 lb 3.2 oz (82.6 kg), SpO2 92%. Body mass index is 35.58 kg/m.  PHYSICAL EXAM:  Exam: General: Well-developed, well-nourished Communication and Voice: Clear pitch and clarity Respiratory Respiratory effort: Equal inspiration and expiration without stridor Cardiovascular Peripheral Vascular: Warm extremities with equal color/perfusion Eyes: No nystagmus with equal extraocular motion bilaterally Neuro/Psych/Balance: Patient oriented to person, place, and time; Appropriate mood and affect; Gait is intact with no imbalance; Cranial nerves I-XII are intact Head and Face Inspection: Normocephalic and atraumatic without mass or lesion Palpation: Facial skeleton intact without bony stepoffs Salivary Glands: No mass or tenderness Facial Strength: Facial motility symmetric and full bilaterally ENT Pinna: External ear intact and fully developed External canal: Canal is patent with intact skin Tympanic Membrane: Clear and mobile External Nose: No scar or anatomic deformity Internal Nose: Septum is relatively straight. No polyp,  or purulence. Mucosal edema and erythema present.  Bilateral inferior turbinate hypertrophy.  Lips, Teeth, and gums: Mucosa and teeth intact and viable TMJ: No pain to palpation with full mobility Oral cavity/oropharynx: No erythema or exudate, no lesions present no tonsils and Friedman IV tongue position  Nasopharynx: No mass or lesion with intact mucosa Hypopharynx: Intact mucosa without pooling of secretions Larynx Glottic: Full true vocal cord mobility without lesion or mass Supraglottic: Normal appearing epiglottis and AE folds Interarytenoid Space: Moderate pachydermia&edema Subglottic Space: Patent without lesion or edema Neck Neck and Trachea: Midline trachea without mass or lesion Thyroid : No mass or nodularity Lymphatics: No lymphadenopathy  Procedure: Preoperative diagnosis: OSA CPAP intolerance   Postoperative diagnosis:   Same  Procedure: Flexible fiberoptic laryngoscopy  Surgeon: Elena Larry, MD  Anesthesia: Topical lidocaine  and Afrin Complications: None Condition is stable throughout exam  Indications and consent:  The patient presents to the clinic with above symptoms. Indirect laryngoscopy view was incomplete. Thus it was recommended that they undergo a flexible fiberoptic laryngoscopy. All of the risks, benefits, and potential complications were reviewed with the patient preoperatively and verbal informed consent was obtained.  Procedure: The patient was seated upright in the clinic. Topical lidocaine  and Afrin were applied to the nasal cavity. After adequate anesthesia had occurred, I then proceeded to pass the flexible telescope into the nasal cavity. The nasal cavity was patent without rhinorrhea or polyp. The nasopharynx was also patent without mass or lesion. The base of tongue was visualized and was normal. There were no signs of pooling of secretions in the piriform sinuses. The true vocal folds were mobile bilaterally. There were no signs of glottic or  supraglottic mucosal lesion or mass. There was moderate interarytenoid pachydermia and post cricoid edema. The telescope was then slowly withdrawn and the patient tolerated the procedure throughout.   Studies Reviewed: Home sleep study 03/22/24    Assessment/Plan: Encounter Diagnoses  Name Primary?   OSA (obstructive sleep apnea) Yes   Intolerance of continuous positive airway pressure (CPAP) ventilation    Assessment & Plan Obstructive Sleep Apnea AHI of 33.7 and no significant central mixed apneas on HST done on 03/22/24 BMI of 35.58.  OSA, moderate to severe, without multilevel collapse, with failure to tolerate PAP therapy and/or more conservative measures. Presence of smaller/absent  tonsils and larger tongue position (Friedman tongue position or modified Mallampati) suggests that hypopharyngeal/retrolingual collapse is contributing to the patient's OSA. Janeth, M et al. Staging of obstructive sleep apnea/hypopnea syndrome: a guide to appropriate treatment. Laryngoscope, 2004 Mar, 114(3):454-9. PMID: 84908781) Options including positional therapy, weight loss, oral appliances, PAP and surgical correction discussed. Pt is not ideal candidate for oral appliance due to severity of OSA Pt could be a candidate for Hypoglossal nerve stimulation (Inspire therapy) pending DISE to rule out [facility]  - Schedule drug-induced sleep endoscopy. - Advised on postoperative care: avoid strenuous activity for a month, expect minimal pain and sore throat for a few days. - Instructed to avoid swimming until follow-up, approximately one to two weeks post-surgery.    Thank you for allowing me to participate in the care of this patient. Please do not hesitate to contact me with any questions or concerns.   Elena Larry, MD Otolaryngology [facility] Phone: [phone] Fax: [phone]    05/24/2024, 10:22 AM

## 2024-05-24 NOTE — H&P (View-Only) (Signed)
 ENT CONSULT:  Reason for Consult: OSA CPAP intolerance   HPI: Discussed the use of AI scribe software for clinical note transcription with the patient, who gave verbal consent to proceed.  History of Present Illness Jean Nash is an 83 year old female with long-standing severe sleep apnea who presents with CPAP intolerance. She is accompanied by her husband.  She initially used a CPAP machine successfully, which coincided with significant weight loss. However, after discontinuing the CPAP due to weight loss, she regained the weight and has not resumed CPAP use. She reports discomfort with pressure on her face and poor quality of sleep with CPAP mask in place.   No history of strokes or heart attacks. She is not currently on blood thinners.No insomnia hx. Here to discuss Inspire.     Records Reviewed:  [name] Office Visit for OSA 01/19/24 female nonsmoker followed for OSA, insomnia, complicated by hypothyroid, asthma, GERD, psoriasis, CA L breast/ biMastectomy  Home Sleep Test 03/06/2015- severe obstructive sleep apnea/hypopnea syndrome, AHI 44.9 per hour with desaturation to 74% and mean saturation only 89% on room air. Body weight 190 pounds   Epworth 4/24    Past Medical History:  Diagnosis Date   Adenomatous colon polyp    Anxiety    PHOBIAS   Arthritis    Breast cancer (HCC) 08/08/2013   right LOQ   Cataract    Chronic insomnia    Cluster headaches    history of migraines / NONE FOR SEVERAL YRS   Depression    Diverticulosis    Fatty liver 2011   Fibromyalgia    GERD (gastroesophageal reflux disease)    H/O hiatal hernia    History of transfusion 08/30/2013   Hx of radiation therapy 10/29/13- 12/14/13   right chest wall 5040 cGy 28 sessions, right supraclavicular/axillary region 5040 cGy 28 sessions, right chest wall boost 1000 cGy 5 sessions   Hypothyroidism    Internal hemorrhoids    Irritable bowel syndrome    Kidney stone    Lymphedema    RT ARM - WEARS  SLEEVE   Macular degeneration    hole/right eye   MDD (major depressive disorder)    Osteopenia    Osteopenia    Other abnormal glucose    Other and unspecified hyperlipidemia    Pain in joint, shoulder region    Pneumonia 8032,7990   Sleep apnea    USES C-PAP   Stress incontinence, female     Past Surgical History:  Procedure Laterality Date   ABDOMINAL HYSTERECTOMY     APPENDECTOMY     BILATERAL TOTAL MASTECTOMY WITH AXILLARY LYMPH NODE DISSECTION  08/30/2013   Dr ARON   BREAST CYST ASPIRATION     9 cysts   CATARACT EXTRACTION, BILATERAL  2005/2007   CHOLECYSTECTOMY     COLONOSCOPY     COLONOSCOPY WITH PROPOFOL  N/A 11/10/2023   Procedure: COLONOSCOPY WITH PROPOFOL ;  Surgeon: Mansouraty, Aloha Raddle., MD;  Location: [facility];  Service: Gastroenterology;  Laterality: N/A;   CYSTOCELE REPAIR     ENDOSCOPIC MUCOSAL RESECTION N/A 11/10/2023   Procedure: ENDOSCOPIC MUCOSAL RESECTION;  Surgeon: Wilhelmenia Aloha Raddle., MD;  Location: [facility];  Service: Gastroenterology;  Laterality: N/A;   EVACUATION BREAST HEMATOMA Left 08/31/2013   Procedure: EVACUATION HEMATOMA BREAST;  Surgeon: Jina ARON, MD;  [facility];  Service: General;  Laterality: Left;   EYE SURGERY     to repair macular hole   FOOT ARTHROPLASTY     lt  GANGLION CYST EXCISION     rt foot   HEMORRHOID SURGERY     03/1993   HEMOSTASIS CLIP PLACEMENT  11/10/2023   Procedure: HEMOSTASIS CLIP PLACEMENT;  Surgeon: Wilhelmenia Aloha Raddle., MD;  Location: [facility];  Service: Gastroenterology;;   JOINT REPLACEMENT  03/15/11   left knee replacement   KNEE ARTHROSCOPY     /partial knee 2016/left knee 2012   MASS EXCISION  11/04/2011   Procedure: EXCISION MASS;  Surgeon: Lamar LULLA Leonor Raddle., MD;  Location: [facility];  Service: Orthopedics;  Laterality: Right;  excisional biopsy right ulna mass   MASTECTOMY W/ SENTINEL NODE BIOPSY Right 08/30/2013   Procedure: RIGHT  AXILLARY SENTINEL  LYMPH NODE BIOPSY; Right Axillary Node Disection;  Surgeon: Jina Nephew, MD;  [facility];  Service: General;  Laterality: Right;  Right side nuc med 7:00    PARTIAL KNEE ARTHROPLASTY Right 11/03/2015   Procedure: RIGHT KNEE MEDIAL UNICOMPARTMENTAL ARTHROPLASTY ;  Surgeon: Dempsey Moan, MD;  Location: [facility];  Service: Orthopedics;  Laterality: Right;   POLYPECTOMY  11/10/2023   Procedure: POLYPECTOMY;  Surgeon: Mansouraty, Aloha Raddle., MD;  Location: [facility];  Service: Gastroenterology;;   RECTOCELE REPAIR     SIMPLE MASTECTOMY WITH AXILLARY SENTINEL NODE BIOPSY Left 08/30/2013   Procedure: Bilateral Breast Mastectomy ;  Surgeon: Jina Nephew, MD;  [facility]: [facility];  Service: General;  Laterality: Left;   skin tags removed     breast, panty line, neckline   SUBMUCOSAL LIFTING INJECTION  11/10/2023   Procedure: SUBMUCOSAL LIFTING INJECTION;  Surgeon: Wilhelmenia Aloha Raddle., MD;  Location: [facility];  Service: Gastroenterology;;   TOE SURGERY     preventative crossover toe surg/right foot   TOE SURGERY  2009   left foot/screw  in 2nd toe   TONSILLECTOMY     UPPER GASTROINTESTINAL ENDOSCOPY      Family History  Problem Relation Age of Onset   Stroke Mother        died age 109   Diabetes Mother    Breast cancer Mother 31   Breast cancer Sister 51   Brain cancer Maternal Uncle 8   Breast cancer Paternal Aunt 48   Breast cancer Paternal Aunt        dx in her 20s   Cancer Maternal Grandmother        intra-abdominal cancer   Diabetes Maternal Grandfather    Breast cancer Paternal Grandmother 89   Brain cancer Cousin 81       maternal cousin   Brain cancer Cousin 20       paternal cousin   Colon cancer Neg Hx    Colon polyps Neg Hx    Crohn's disease Neg Hx    Esophageal cancer Neg Hx    Rectal cancer Neg Hx    Stomach cancer Neg Hx    Ulcerative colitis Neg Hx    Inflammatory bowel disease Neg Hx    Liver disease Neg Hx    Pancreatic cancer Neg Hx     Social  History:  reports that she quit smoking about 63 years ago. Her smoking use included cigarettes. She started smoking about 64 years ago. She has a 0.2 pack-year smoking history. She has been exposed to tobacco smoke. She has never used smokeless tobacco. She reports current alcohol use. She reports that she does not use drugs.  Allergies: No Known Allergies  Medications: I have reviewed the patient's current medications.  The PMH, PSH, Medications, Allergies,  and SH were reviewed and updated.  ROS: Constitutional: Negative for fever, weight loss and weight gain. Cardiovascular: Negative for chest pain and dyspnea on exertion. Respiratory: Is not experiencing shortness of breath at rest. Gastrointestinal: Negative for nausea and vomiting. Neurological: Negative for headaches. Psychiatric: The patient is not nervous/anxious  Blood pressure 129/76, pulse 91, weight 182 lb 3.2 oz (82.6 kg), SpO2 92%. Body mass index is 35.58 kg/m.  PHYSICAL EXAM:  Exam: General: Well-developed, well-nourished Communication and Voice: Clear pitch and clarity Respiratory Respiratory effort: Equal inspiration and expiration without stridor Cardiovascular Peripheral Vascular: Warm extremities with equal color/perfusion Eyes: No nystagmus with equal extraocular motion bilaterally Neuro/Psych/Balance: Patient oriented to person, place, and time; Appropriate mood and affect; Gait is intact with no imbalance; Cranial nerves I-XII are intact Head and Face Inspection: Normocephalic and atraumatic without mass or lesion Palpation: Facial skeleton intact without bony stepoffs Salivary Glands: No mass or tenderness Facial Strength: Facial motility symmetric and full bilaterally ENT Pinna: External ear intact and fully developed External canal: Canal is patent with intact skin Tympanic Membrane: Clear and mobile External Nose: No scar or anatomic deformity Internal Nose: Septum is relatively straight. No polyp,  or purulence. Mucosal edema and erythema present.  Bilateral inferior turbinate hypertrophy.  Lips, Teeth, and gums: Mucosa and teeth intact and viable TMJ: No pain to palpation with full mobility Oral cavity/oropharynx: No erythema or exudate, no lesions present no tonsils and Friedman IV tongue position  Nasopharynx: No mass or lesion with intact mucosa Hypopharynx: Intact mucosa without pooling of secretions Larynx Glottic: Full true vocal cord mobility without lesion or mass Supraglottic: Normal appearing epiglottis and AE folds Interarytenoid Space: Moderate pachydermia&edema Subglottic Space: Patent without lesion or edema Neck Neck and Trachea: Midline trachea without mass or lesion Thyroid : No mass or nodularity Lymphatics: No lymphadenopathy  Procedure: Preoperative diagnosis: OSA CPAP intolerance   Postoperative diagnosis:   Same  Procedure: Flexible fiberoptic laryngoscopy  Surgeon: Elena Larry, MD  Anesthesia: Topical lidocaine  and Afrin Complications: None Condition is stable throughout exam  Indications and consent:  The patient presents to the clinic with above symptoms. Indirect laryngoscopy view was incomplete. Thus it was recommended that they undergo a flexible fiberoptic laryngoscopy. All of the risks, benefits, and potential complications were reviewed with the patient preoperatively and verbal informed consent was obtained.  Procedure: The patient was seated upright in the clinic. Topical lidocaine  and Afrin were applied to the nasal cavity. After adequate anesthesia had occurred, I then proceeded to pass the flexible telescope into the nasal cavity. The nasal cavity was patent without rhinorrhea or polyp. The nasopharynx was also patent without mass or lesion. The base of tongue was visualized and was normal. There were no signs of pooling of secretions in the piriform sinuses. The true vocal folds were mobile bilaterally. There were no signs of glottic or  supraglottic mucosal lesion or mass. There was moderate interarytenoid pachydermia and post cricoid edema. The telescope was then slowly withdrawn and the patient tolerated the procedure throughout.   Studies Reviewed: Home sleep study 03/22/24    Assessment/Plan: Encounter Diagnoses  Name Primary?   OSA (obstructive sleep apnea) Yes   Intolerance of continuous positive airway pressure (CPAP) ventilation    Assessment & Plan Obstructive Sleep Apnea AHI of 33.7 and no significant central mixed apneas on HST done on 03/22/24 BMI of 35.58.  OSA, moderate to severe, without multilevel collapse, with failure to tolerate PAP therapy and/or more conservative measures. Presence of smaller/absent  tonsils and larger tongue position (Friedman tongue position or modified Mallampati) suggests that hypopharyngeal/retrolingual collapse is contributing to the patient's OSA. Janeth, M et al. Staging of obstructive sleep apnea/hypopnea syndrome: a guide to appropriate treatment. Laryngoscope, 2004 Mar, 114(3):454-9. PMID: [id number]) Options including positional therapy, weight loss, oral appliances, PAP and surgical correction discussed. Pt is not ideal candidate for oral appliance due to severity of OSA Pt could be a candidate for Hypoglossal nerve stimulation (Inspire therapy) pending DISE to rule out [facility]  - Schedule drug-induced sleep endoscopy. - Advised on postoperative care: avoid strenuous activity for a month, expect minimal pain and sore throat for a few days. - Instructed to avoid swimming until follow-up, approximately one to two weeks post-surgery.    Thank you for allowing me to participate in the care of this patient. Please do not hesitate to contact me with any questions or concerns.   Elena Larry, MD Otolaryngology [facility] Phone: [phone] Fax: [phone]    05/24/2024, 10:22 AM

## 2024-05-25 ENCOUNTER — Encounter: Payer: Self-pay | Admitting: Adult Health

## 2024-05-25 ENCOUNTER — Ambulatory Visit (INDEPENDENT_AMBULATORY_CARE_PROVIDER_SITE_OTHER)

## 2024-05-25 ENCOUNTER — Ambulatory Visit (INDEPENDENT_AMBULATORY_CARE_PROVIDER_SITE_OTHER): Admitting: Adult Health

## 2024-05-25 VITALS — BP 120/80 | HR 86 | Temp 98.3°F | Ht 60.0 in | Wt 182.0 lb

## 2024-05-25 DIAGNOSIS — G8929 Other chronic pain: Secondary | ICD-10-CM | POA: Diagnosis not present

## 2024-05-25 DIAGNOSIS — L7682 Other postprocedural complications of skin and subcutaneous tissue: Secondary | ICD-10-CM

## 2024-05-25 DIAGNOSIS — M25562 Pain in left knee: Secondary | ICD-10-CM | POA: Diagnosis not present

## 2024-05-25 NOTE — Progress Notes (Signed)
 Subjective:    [name], female    DOB: 1941/08/11, 83 y.o.   MRN: [id number]  HPI  83 year old female who is being evaluated today with her husband for multiple issues.  Left knee pain-has history of left knee replacement in approximately 2011.  Over the years she has had some discomfort in that left knee.  Most recent x-ray was in 2023 which showed that her hardware was intact.  She reports worsening pain in her left knee to the point where it is painful to walk and she has to use a cane.  She denies any trauma or aggravating injury.  Right chest wall pain-has been an ongoing issue for quite some time.  She does have a history of right sided mastectomy and for many years has had pain at the incision site.  In the past we have used Lyrica  which worked to some degree but stopped taking it when she developed sleepiness and forgetfulness.  She has had a recent sleep study that showed severe sleep apnea and is planning on having the inspire device done.  She reports worsening surgical site pain and is wondering if she can restart her Lyrica .  She did have a CT of the chest abdomen and pelvis in 2024 which showed no metastatic disease.  Review of Systems See HPI   Past Medical History:  Diagnosis Date   Adenomatous colon polyp    Anxiety    PHOBIAS   Arthritis    Breast cancer (HCC) 08/08/2013   right LOQ   Cataract    Chronic insomnia    Cluster headaches    history of migraines / NONE FOR SEVERAL YRS   Depression    Diverticulosis    Fatty liver 2011   Fibromyalgia    GERD (gastroesophageal reflux disease)    H/O hiatal hernia    History of transfusion 08/30/2013   Hx of radiation therapy 10/29/13- 12/14/13   right chest wall 5040 cGy 28 sessions, right supraclavicular/axillary region 5040 cGy 28 sessions, right chest wall boost 1000 cGy 5 sessions   Hypothyroidism    Internal hemorrhoids    Irritable bowel syndrome    Kidney stone    Lymphedema    RT ARM -  WEARS SLEEVE   Macular degeneration    hole/right eye   MDD (major depressive disorder)    Osteopenia    Osteopenia    Other abnormal glucose    Other and unspecified hyperlipidemia    Pain in joint, shoulder region    Pneumonia 8032,7990   Sleep apnea    USES C-PAP   Stress incontinence, female     Social History   Socioeconomic History   Marital status: Married    Spouse name: Ubaldo   Number of children: 3   Years of education: Not on file   Highest education level: GED or equivalent  Occupational History   Occupation: retired Catering manager  Tobacco Use   Smoking status: Former    Current packs/day: 0.00    Average packs/day: 0.1 packs/day for 2.0 years (0.2 ttl pk-yrs)    Types: Cigarettes    Start date: 11/16/1959    Quit date: 11/15/1960    Years since quitting: 63.5    Passive exposure: Past (over 58 years ago)   Smokeless tobacco: Never  Vaping Use   Vaping status: Never Used  Substance and Sexual Activity   Alcohol use: Yes    Comment: rarely   Drug use: No   Sexual  activity: Yes    Comment: menarche age 57, fist live birth 89, P 3, hysterectomy age 53, no HRT, BCP 2 yrs  Other Topics Concern   Not on file  Social History Narrative   Occupation:  Retired Catering manager    Married with 3 grown children      Never Smoked     Alcohol use-no        Social Drivers of Corporate investment banker Strain: Low Risk  (12/22/2023)   Overall Financial Resource Strain (CARDIA)    Difficulty of Paying Living Expenses: Not hard at all  Food Insecurity: Unknown (12/22/2023)   Hunger Vital Sign    Worried About Running Out of Food in the Last Year: Never true    Ran Out of Food in the Last Year: Not on file  Transportation Needs: No Transportation Needs (02/09/2023)   PRAPARE - Administrator, Civil Service (Medical): No    Lack of Transportation (Non-Medical): No  Physical Activity: Unknown (12/22/2023)   Exercise Vital Sign    Days of Exercise per Week: 0 days     Minutes of Exercise per Session: Not on file  Recent Concern: Physical Activity - Inactive (12/22/2023)   Exercise Vital Sign    Days of Exercise per Week: 0 days    Minutes of Exercise per Session: 0 min  Stress: Stress Concern Present (12/22/2023)   [employer] of Occupational Health - Occupational Stress Questionnaire    Feeling of Stress : To some extent  Social Connections: Unknown (12/22/2023)   Social Connection and Isolation Panel    Frequency of Communication with Friends and Family: Patient declined    Frequency of Social Gatherings with Friends and Family: Patient declined    Attends Religious Services: Patient declined    Active Member of Clubs or Organizations: No    Attends Engineer, structural: Not on file    Marital Status: Married  Catering manager Violence: Not At Risk (02/09/2023)   Humiliation, Afraid, Rape, and Kick questionnaire    Fear of Current or Ex-Partner: No    Emotionally Abused: No    Physically Abused: No    Sexually Abused: No    Past Surgical History:  Procedure Laterality Date   ABDOMINAL HYSTERECTOMY     APPENDECTOMY     BILATERAL TOTAL MASTECTOMY WITH AXILLARY LYMPH NODE DISSECTION  08/30/2013   Dr ARON   BREAST CYST ASPIRATION     9 cysts   CATARACT EXTRACTION, BILATERAL  2005/2007   CHOLECYSTECTOMY     COLONOSCOPY     COLONOSCOPY WITH PROPOFOL  N/A 11/10/2023   Procedure: COLONOSCOPY WITH PROPOFOL ;  Surgeon: Wilhelmenia Aloha Raddle., MD;  Location: [facility];  Service: Gastroenterology;  Laterality: N/A;   CYSTOCELE REPAIR     ENDOSCOPIC MUCOSAL RESECTION N/A 11/10/2023   Procedure: ENDOSCOPIC MUCOSAL RESECTION;  Surgeon: Wilhelmenia Aloha Raddle., MD;  Location: [facility];  Service: Gastroenterology;  Laterality: N/A;   EVACUATION BREAST HEMATOMA Left 08/31/2013   Procedure: EVACUATION HEMATOMA BREAST;  Surgeon: Jina ARON, MD;  Location: [facility];  Service: General;  Laterality: Left;   EYE SURGERY     to repair macular  hole   FOOT ARTHROPLASTY     lt    GANGLION CYST EXCISION     rt foot   HEMORRHOID SURGERY     03/1993   HEMOSTASIS CLIP PLACEMENT  11/10/2023   Procedure: HEMOSTASIS CLIP PLACEMENT;  Surgeon: Wilhelmenia Aloha Raddle., MD;  Location: [facility];  Service: Gastroenterology;;   JOINT REPLACEMENT  03/15/11   left knee replacement   KNEE ARTHROSCOPY     /partial knee 2016/left knee 2012   MASS EXCISION  11/04/2011   Procedure: EXCISION MASS;  Surgeon: Lamar LULLA Leonor Mickey., MD;  Location: [facility];  Service: Orthopedics;  Laterality: Right;  excisional biopsy right ulna mass   MASTECTOMY W/ SENTINEL NODE BIOPSY Right 08/30/2013   Procedure: RIGHT  AXILLARY SENTINEL LYMPH NODE BIOPSY; Right Axillary Node Disection;  Surgeon: Jina Nephew, MD;  Location: [facility];  Service: General;  Laterality: Right;  Right side nuc med 7:00    PARTIAL KNEE ARTHROPLASTY Right 11/03/2015   Procedure: RIGHT KNEE MEDIAL UNICOMPARTMENTAL ARTHROPLASTY ;  Surgeon: Dempsey Moan, MD;  Location: [facility];  Service: Orthopedics;  Laterality: Right;   POLYPECTOMY  11/10/2023   Procedure: POLYPECTOMY;  Surgeon: Mansouraty, Aloha Mickey., MD;  Location: [facility];  Service: Gastroenterology;;   RECTOCELE REPAIR     SIMPLE MASTECTOMY WITH AXILLARY SENTINEL NODE BIOPSY Left 08/30/2013   Procedure: Bilateral Breast Mastectomy ;  Surgeon: Jina Nephew, MD;  Location: [facility];  Service: General;  Laterality: Left;   skin tags removed     breast, panty line, neckline   SUBMUCOSAL LIFTING INJECTION  11/10/2023   Procedure: SUBMUCOSAL LIFTING INJECTION;  Surgeon: Wilhelmenia Aloha Mickey., MD;  Location: [facility];  Service: Gastroenterology;;   TOE SURGERY     preventative crossover toe surg/right foot   TOE SURGERY  2009   left foot/screw  in 2nd toe   TONSILLECTOMY     UPPER GASTROINTESTINAL ENDOSCOPY      Family History  Problem Relation Age of Onset   Stroke Mother        died age 38   Diabetes Mother     Breast cancer Mother 24   Breast cancer Sister 8   Brain cancer Maternal Uncle 8   Breast cancer Paternal Aunt 74   Breast cancer Paternal Aunt        dx in her 26s   Cancer Maternal Grandmother        intra-abdominal cancer   Diabetes Maternal Grandfather    Breast cancer Paternal Grandmother 37   Brain cancer Cousin 2       maternal cousin   Brain cancer Cousin 20       paternal cousin   Colon cancer Neg Hx    Colon polyps Neg Hx    Crohn's disease Neg Hx    Esophageal cancer Neg Hx    Rectal cancer Neg Hx    Stomach cancer Neg Hx    Ulcerative colitis Neg Hx    Inflammatory bowel disease Neg Hx    Liver disease Neg Hx    Pancreatic cancer Neg Hx     No Known Allergies  Current Outpatient Medications on File Prior to Visit  Medication Sig Dispense Refill   Bacillus Coagulans-Inulin (PROBIOTIC) 1-250 BILLION-MG CAPS Take by mouth.     butalbital -acetaminophen -caffeine  (FIORICET) 50-325-40 MG tablet TAKE 1 TABLET BY MOUTH DAILY AS NEEDED FOR HEADACHE 30 tablet 0   chlorproMAZINE (THORAZINE) 25 MG tablet Take 25 mg by mouth as needed.     DULoxetine  (CYMBALTA ) 30 MG capsule Take 1 capsule (30 mg total) by mouth every evening. 90 capsule 0   Erenumab -aooe (AIMOVIG ) 140 MG/ML SOAJ Inject 140 mg into the skin every 30 (thirty) days. 1.12 mL 3   esomeprazole  (NEXIUM ) 40 MG capsule TAKE 1 CAPSULE BY MOUTH DAILY AT  NOON 365 capsule 0   famotidine  (PEPCID ) 40 MG tablet TAKE 1 TABLET BY MOUTH AT BEDTIME 90 tablet 1   glucose blood (EMBRACE PRO GLUCOSE TEST) test strip Use to check blood sugar TID 200 each 12   levothyroxine  (SYNTHROID ) 100 MCG tablet TAKE 1 TABLET BY MOUTH DAILY 365 tablet 0   nitrofurantoin , macrocrystal-monohydrate, (MACROBID ) 100 MG capsule TAKE 1 CAPSULE BY MOUTH AT BEDTIME 365 capsule 0   omega-3 acid ethyl esters (LOVAZA ) 1 g capsule TAKE 1 CAPSULE BY MOUTH TWICE A DAY 180 capsule 3   Polyethylene Glycol 3350  (MIRALAX  PO) Take by mouth. 1/2 capful every  morning     Rimegepant Sulfate (NURTEC) 75 MG TBDP Take 1 tablet as needed for acute migraine attacks 8 tablet 3   No current facility-administered medications on file prior to visit.    BP 120/80   Pulse 86   Temp 98.3 F (36.8 C) (Oral)   Ht 5' (1.524 m)   Wt 182 lb (82.6 kg)   SpO2 94%   BMI 35.54 kg/m       Objective:   Physical Exam Vitals and nursing note reviewed.  Constitutional:      Appearance: Normal appearance.  Cardiovascular:     Rate and Rhythm: Normal rate and regular rhythm.     Pulses: Normal pulses.     Heart sounds: Normal heart sounds.  Musculoskeletal:        General: Normal range of motion.  Skin:    General: Skin is warm and dry.     Capillary Refill: Capillary refill takes less than 2 seconds.  Neurological:     General: No focal deficit present.     Mental Status: She is alert and oriented to person, place, and time.  Psychiatric:        Mood and Affect: Mood normal.        Behavior: Behavior normal.        Thought Content: Thought content normal.        Judgment: Judgment normal.       Assessment & Plan:  1. Chronic pain of left knee (Primary) - Will get xray of left knee and consider referral to PT or ortho  - DG Knee 1-2 Views Left; Future  2. Pain at surgical incision - I am ok with her taking Lyrica  that she has at home for this issue.   Beckam Abdulaziz, NP

## 2024-05-25 NOTE — Telephone Encounter (Signed)
 Jean Nash, Jean Nash, Jean Nash, CMA Good Morning,  This patient was seen by our office 05/24/24  Thanks, Bre

## 2024-05-28 ENCOUNTER — Encounter (HOSPITAL_BASED_OUTPATIENT_CLINIC_OR_DEPARTMENT_OTHER): Payer: Self-pay

## 2024-05-29 ENCOUNTER — Ambulatory Visit: Payer: Self-pay | Admitting: Adult Health

## 2024-05-29 ENCOUNTER — Other Ambulatory Visit: Payer: Self-pay | Admitting: Adult Health

## 2024-05-29 DIAGNOSIS — G8929 Other chronic pain: Secondary | ICD-10-CM

## 2024-05-29 NOTE — Anesthesia Preprocedure Evaluation (Signed)
 Anesthesia Evaluation  Patient identified by MRN, date of birth, ID band Patient awake    Reviewed: Allergy & Precautions, NPO status , Patient's Chart, lab work & pertinent test results  History of Anesthesia Complications Negative for: history of anesthetic complications  Airway Mallampati: III  TM Distance: >3 FB Neck ROM: Full    Dental no notable dental hx.    Pulmonary asthma , sleep apnea and Continuous Positive Airway Pressure Ventilation , former smoker   Pulmonary exam normal        Cardiovascular negative cardio ROS Normal cardiovascular exam     Neuro/Psych  Headaches  Anxiety Depression       GI/Hepatic Neg liver ROS, hiatal hernia,GERD  Medicated,,  Endo/Other  Hypothyroidism    Renal/GU negative Renal ROS     Musculoskeletal  (+) Arthritis ,  Fibromyalgia -  Abdominal   Peds  Hematology negative hematology ROS (+)   Anesthesia Other Findings Day of surgery medications reviewed with patient.  Reproductive/Obstetrics negative OB ROS                              Anesthesia Physical Anesthesia Plan  ASA: 2  Anesthesia Plan: MAC   Post-op Pain Management: Minimal or no pain anticipated   Induction:   PONV Risk Score and Plan: 0 and Treatment may vary due to age or medical condition and Propofol  infusion  Airway Management Planned: Natural Airway and Nasal Cannula  Additional Equipment: None  Intra-op Plan:   Post-operative Plan:   Informed Consent: I have reviewed the patients History and Physical, chart, labs and discussed the procedure including the risks, benefits and alternatives for the proposed anesthesia with the patient or authorized representative who has indicated his/her understanding and acceptance.       Plan Discussed with: CRNA  Anesthesia Plan Comments:          Anesthesia Quick Evaluation

## 2024-05-30 ENCOUNTER — Ambulatory Visit (HOSPITAL_BASED_OUTPATIENT_CLINIC_OR_DEPARTMENT_OTHER)
Admission: RE | Admit: 2024-05-30 | Discharge: 2024-05-30 | Disposition: A | Discharge status: Home / Self Care | Source: Ambulatory Visit | Attending: Otolaryngology | Admitting: Otolaryngology

## 2024-05-30 ENCOUNTER — Ambulatory Visit (HOSPITAL_BASED_OUTPATIENT_CLINIC_OR_DEPARTMENT_OTHER): Payer: Self-pay | Admitting: Anesthesiology

## 2024-05-30 ENCOUNTER — Encounter (HOSPITAL_BASED_OUTPATIENT_CLINIC_OR_DEPARTMENT_OTHER): Payer: Self-pay

## 2024-05-30 ENCOUNTER — Other Ambulatory Visit: Payer: Self-pay

## 2024-05-30 ENCOUNTER — Other Ambulatory Visit (INDEPENDENT_AMBULATORY_CARE_PROVIDER_SITE_OTHER): Payer: Self-pay | Admitting: Otolaryngology

## 2024-05-30 ENCOUNTER — Encounter (HOSPITAL_BASED_OUTPATIENT_CLINIC_OR_DEPARTMENT_OTHER)
Admission: RE | Disposition: A | Discharge status: Home / Self Care | Payer: Self-pay | Source: Ambulatory Visit | Attending: Otolaryngology

## 2024-05-30 DIAGNOSIS — M1990 Unspecified osteoarthritis, unspecified site: Secondary | ICD-10-CM | POA: Diagnosis not present

## 2024-05-30 DIAGNOSIS — K219 Gastro-esophageal reflux disease without esophagitis: Secondary | ICD-10-CM | POA: Diagnosis not present

## 2024-05-30 DIAGNOSIS — F32A Depression, unspecified: Secondary | ICD-10-CM | POA: Diagnosis not present

## 2024-05-30 DIAGNOSIS — M797 Fibromyalgia: Secondary | ICD-10-CM | POA: Diagnosis not present

## 2024-05-30 DIAGNOSIS — K449 Diaphragmatic hernia without obstruction or gangrene: Secondary | ICD-10-CM | POA: Diagnosis not present

## 2024-05-30 DIAGNOSIS — G4733 Obstructive sleep apnea (adult) (pediatric): Secondary | ICD-10-CM

## 2024-05-30 DIAGNOSIS — E039 Hypothyroidism, unspecified: Secondary | ICD-10-CM | POA: Insufficient documentation

## 2024-05-30 DIAGNOSIS — F419 Anxiety disorder, unspecified: Secondary | ICD-10-CM | POA: Insufficient documentation

## 2024-05-30 DIAGNOSIS — Z789 Other specified health status: Secondary | ICD-10-CM

## 2024-05-30 DIAGNOSIS — Z87891 Personal history of nicotine dependence: Secondary | ICD-10-CM | POA: Diagnosis not present

## 2024-05-30 DIAGNOSIS — R519 Headache, unspecified: Secondary | ICD-10-CM | POA: Insufficient documentation

## 2024-05-30 DIAGNOSIS — Z01818 Encounter for other preprocedural examination: Secondary | ICD-10-CM

## 2024-05-30 DIAGNOSIS — J45909 Unspecified asthma, uncomplicated: Secondary | ICD-10-CM | POA: Diagnosis not present

## 2024-05-30 HISTORY — PX: DRUG INDUCED ENDOSCOPY: SHX6808

## 2024-05-30 SURGERY — DRUG INDUCED SLEEP ENDOSCOPY
Anesthesia: Monitor Anesthesia Care | Site: Nose

## 2024-05-30 MED ORDER — LACTATED RINGERS IV SOLN: INTRAVENOUS | Status: DC

## 2024-05-30 MED ORDER — LIDOCAINE 2% (20 MG/ML) 5 ML SYRINGE
INTRAMUSCULAR | Status: DC | PRN
  Administered 2024-05-30: 100 mg via INTRAVENOUS

## 2024-05-30 MED ORDER — PROPOFOL 500 MG/50ML IV EMUL
INTRAVENOUS | Status: DC | PRN
  Administered 2024-05-30: 125 ug/kg/min via INTRAVENOUS

## 2024-05-30 MED ORDER — PROPOFOL 10 MG/ML IV BOLUS
INTRAVENOUS | Status: DC | PRN
  Administered 2024-05-30: 20 mg via INTRAVENOUS
  Administered 2024-05-30: 10 mg via INTRAVENOUS
  Administered 2024-05-30: 40 mg via INTRAVENOUS

## 2024-05-30 SURGICAL SUPPLY — 13 items
BRONCHOSCOPE PED SLIM DISP (MISCELLANEOUS) ×1 IMPLANT
CANISTER SUCT 1200ML W/VALVE (MISCELLANEOUS) IMPLANT
GLOVE BIO SURGEON STRL SZ 6 (GLOVE) ×1 IMPLANT
KIT CLEAN ENDO (MISCELLANEOUS) IMPLANT
NDL HYPO 22X1.5 SAFETY MO (MISCELLANEOUS) ×1 IMPLANT
NEEDLE HYPO 22X1.5 SAFETY MO (MISCELLANEOUS) ×1 IMPLANT
PATTIES SURGICAL .5 X3 (DISPOSABLE) IMPLANT
SHEET MEDIUM DRAPE 40X70 STRL (DRAPES) IMPLANT
SOLUTION ANTFG W/FOAM PAD STRL (MISCELLANEOUS) ×1 IMPLANT
SURGILUBE 2OZ TUBE FLIPTOP (MISCELLANEOUS) ×1 IMPLANT
SYR 10ML LL (SYRINGE) ×1 IMPLANT
TOWEL GREEN STERILE FF (TOWEL DISPOSABLE) ×1 IMPLANT
TUBE CONNECTING 20X1/4 (TUBING) IMPLANT

## 2024-05-30 NOTE — Interval H&P Note (Signed)
 History and Physical Interval Note:  05/30/2024 8:21 AM  Jean Nash  has presented today for surgery, with the diagnosis of Obstructive sleep apnea.  The various methods of treatment have been discussed with the patient and family. After consideration of risks, benefits and other options for treatment, the patient has consented to  Procedure(s): DRUG INDUCED SLEEP ENDOSCOPY (N/A) as a surgical intervention.  The patient's history has been reviewed, patient examined, no change in status, stable for surgery.  I have reviewed the patient's chart and labs.  Questions were answered to the patient's satisfaction.     Meredeth Furber

## 2024-05-30 NOTE — Anesthesia Postprocedure Evaluation (Signed)
 Anesthesia Post Note  Patient: Jean Nash  Procedure(s) Performed: DRUG INDUCED SLEEP ENDOSCOPY (Nose)     Patient location during evaluation: PACU Anesthesia Type: MAC Level of consciousness: awake and alert Pain management: pain level controlled Vital Signs Assessment: post-procedure vital signs reviewed and stable Respiratory status: spontaneous breathing, nonlabored ventilation and respiratory function stable Cardiovascular status: blood pressure returned to baseline Postop Assessment: no apparent nausea or vomiting Anesthetic complications: no   No notable events documented.  Last Vitals:  Vitals:   05/30/24 0930 05/30/24 0941  BP: 125/74   Pulse: 70 68  Resp: 15 18  Temp:    SpO2: 96% 95%    Last Pain:  Vitals:   05/30/24 0941  TempSrc:   PainSc: 0-No pain                 Vertell Row

## 2024-05-30 NOTE — Discharge Instructions (Addendum)
 DRUG-INDUCED SLEEP ENDOSCOPY POST-OPERATIVE INSTRUCTIONS:  Based on the drug-induced sleep endoscopy today, you were deemed a candidate for Inspire Therapy.  Please review post-operative and recovery instructions below that you will need to be aware of after Inspire Implant surgery.  Please restart all of your home medications if you take anything on a daily basis.  You can resume regular diet after this procedure.  You will be scheduled for pre-operative appointment with Dr. Irene Pap to review details about surgery and to discuss the next steps.   DIET: Resume normal diet HYGIENE: Please wait until 48 hours after surgery before getting incisions on neck, chest, and torso wet. In the first 48 hours after surgery, will likely need to take sponge baths. WOUND CARE: Please leave pressure dressing on for 48 hours after surgery. Gently place antibiotic ointment over incisions 2 times per day; use clean q-tip. May place a clean bandage over incisions as needed. After 48 hours, you may get incisions wet with warm soap and water, but do not soak the incisions.  Pat area dry gently.  Immediately place antibiotic ointment. Take oral antibiotics as prescribed If skin around incision starts to get red (> 1cm), swollen, and/or more painful, please call the office ACTIVITY: Try to avoid sleeping on the side of your surgery, to the extent possible.   You may walk for exercise starting the day after surgery. For 2 weeks: Do not pick up anything greater than 5 pounds with the hand/arm that's on the same side as the surgery.  After 2 weeks, you may increase weight to 10 pounds.   Consider performing neck rolls 10 clockwise and 10 counterclockwise 3x/day. For 4 weeks, no strenuous activity (running, jogging, lifting weights, gardening, sports) or until cleared by physician.   PAIN MEDICATIONS: You will be prescribed xxx for pain.   If pain is not severe, consider taking Tylenol 650mg  every 6 hours Avoid  aspirin for 7 days after surgery Avoid direct heat (such as heating pads) to incision sites.   May gently place ice over surgery sites as needed.  Please place a thin clean towel over skin first and then place ice bag over towel.  Ice for 10 minutes at a time only.  POST-OPERATIVE CLINIC APPOINTMENTS: 1 week: suture removal and wound check in the office.  1 month: device activation and wound check in the office. 2.5 months: check in visit to assess usage. 3-4 months: titration sleep study based on usage of >4 hrs/night.  4 months: final wound check in the office.  Yearly: device check at office.  SCAR CARE: After incisions have healed, you will have a scar, which will continue to evolve over the course of 12 months.  Caring for your incision scars will help them to be as minimal as possible. If you are out in the sun with incision exposure, please remember to place sunscreen over the incision and surrounding skin.   You may use vitamin E or "Scar ointment/cream" to help soften scar.  Please wait one month after surgery before starting this.      Post Anesthesia Home Care Instructions  Activity: Get plenty of rest for the remainder of the day. A responsible individual must stay with you for 24 hours following the procedure.  For the next 24 hours, DO NOT: -Drive a car -Advertising copywriter -Drink alcoholic beverages -Take any medication unless instructed by your physician -Make any legal decisions or sign important papers.  Meals: Start with liquid foods such as gelatin or  soup. Progress to regular foods as tolerated. Avoid greasy, spicy, heavy foods. If nausea and/or vomiting occur, drink only clear liquids until the nausea and/or vomiting subsides. Call your physician if vomiting continues.  Special Instructions/Symptoms: Your throat may feel dry or sore from the anesthesia or the breathing tube placed in your throat during surgery. If this causes discomfort, gargle with warm salt  water. The discomfort should disappear within 24 hours.  If you had a scopolamine patch placed behind your ear for the management of post- operative nausea and/or vomiting:  1. The medication in the patch is effective for 72 hours, after which it should be removed.  Wrap patch in a tissue and discard in the trash. Wash hands thoroughly with soap and water. 2. You may remove the patch earlier than 72 hours if you experience unpleasant side effects which may include dry mouth, dizziness or visual disturbances. 3. Avoid touching the patch. Wash your hands with soap and water after contact with the patch.

## 2024-05-30 NOTE — Op Note (Signed)
 Operative note  Preoperative diagnosis: OSA Postoperative diagnosis:   Same  Procedure:DISE ( Drug induced sleep endoscopy)  Anesthesia: Topical lidocaine  gel Complications: None Condition is stable throughout exam  Indications and consent:   The patient presents to my otolaryngology clinic with a chief complaint of OSA  Because of pt's OSA and desire to be a candidate for Inspire therapy, it was recommended that they undergo a flexible fiberoptic laryngoscopy under sedation (DISE).   All the risks, benefits, and potential complications were reviewed with the patient preoperatively and informed consent was obtained.  Procedure: Pt was brought back to the OR and laid supine on the table. Propofol  anesthesia was administered per protocol until pt reached optimal level of sedation. DISE exam showed the following anatomic collapse pattern.  VOTE classification Velopharynx- 2 A-P Oropharynx- 1 Tongue base- 2 Epiglottis- 1  There was no evidence of complete concentric collapse at the soft palate  Based on the DISE findings, pt is a candidate for Hypoglossal nerve stimulation (Inspire therapy) based on the above anatomy.

## 2024-05-30 NOTE — Transfer of Care (Signed)
 Immediate Anesthesia Transfer of Care Note  Patient: Jean Nash  Procedure(s) Performed: DRUG INDUCED SLEEP ENDOSCOPY (Nose)  Patient Location: PACU  Anesthesia Type:General  Level of Consciousness: awake and alert   Airway & Oxygen Therapy: Patient Spontanous Breathing and Patient connected to face mask oxygen  Post-op Assessment: Report given to RN and Post -op Vital signs reviewed and stable  Post vital signs: Reviewed and stable  Last Vitals:  Vitals Value Taken Time  BP 119/60 (77)   Temp    Pulse 72 05/30/24 09:26  Resp 15 05/30/24 09:26  SpO2 94 % 05/30/24 09:26  Vitals shown include unfiled device data.  Last Pain:  Vitals:   05/30/24 0748  TempSrc: Temporal  PainSc: 0-No pain      Patients Stated Pain Goal: 3 (05/30/24 0748)  Complications: No notable events documented.

## 2024-05-31 ENCOUNTER — Encounter (HOSPITAL_BASED_OUTPATIENT_CLINIC_OR_DEPARTMENT_OTHER): Payer: Self-pay | Admitting: Otolaryngology

## 2024-06-05 ENCOUNTER — Encounter: Payer: Self-pay | Admitting: Rehabilitation

## 2024-06-05 ENCOUNTER — Other Ambulatory Visit: Payer: Self-pay

## 2024-06-05 ENCOUNTER — Ambulatory Visit: Attending: Adult Health | Admitting: Rehabilitation

## 2024-06-05 DIAGNOSIS — M25561 Pain in right knee: Secondary | ICD-10-CM | POA: Diagnosis present

## 2024-06-05 DIAGNOSIS — G8929 Other chronic pain: Secondary | ICD-10-CM | POA: Insufficient documentation

## 2024-06-05 DIAGNOSIS — M25562 Pain in left knee: Secondary | ICD-10-CM | POA: Diagnosis present

## 2024-06-05 DIAGNOSIS — R2689 Other abnormalities of gait and mobility: Secondary | ICD-10-CM | POA: Diagnosis present

## 2024-06-05 NOTE — Therapy (Signed)
 OUTPATIENT PHYSICAL THERAPY LOWER EXTREMITY EVALUATION   Patient Name: Jean Nash MRN: [id number] DOB:08-Apr-1941, 83 y.o., female Today's Date: 06/05/2024  END OF SESSION:  PT End of Session - 06/05/24 1611     Visit Number 1    Number of Visits 13    Date for PT Re-Evaluation 07/17/24    Authorization Type none    PT Start Time 1515    PT Stop Time 1600    PT Time Calculation (min) 45 min    Activity Tolerance Patient tolerated treatment well    Behavior During Therapy WFL for tasks assessed/performed          Past Medical History:  Diagnosis Date   Adenomatous colon polyp    Anxiety    PHOBIAS   Arthritis    Breast cancer (HCC) 08/08/2013   right LOQ   Cataract    Chronic insomnia    Cluster headaches    history of migraines / NONE FOR SEVERAL YRS   Depression    Diverticulosis    Fatty liver 2011   Fibromyalgia    GERD (gastroesophageal reflux disease)    H/O hiatal hernia    History of transfusion 08/30/2013   Hx of radiation therapy 10/29/13- 12/14/13   right chest wall 5040 cGy 28 sessions, right supraclavicular/axillary region 5040 cGy 28 sessions, right chest wall boost 1000 cGy 5 sessions   Hypothyroidism    Internal hemorrhoids    Irritable bowel syndrome    Kidney stone    Lymphedema    RT ARM - WEARS SLEEVE   Macular degeneration    hole/right eye   MDD (major depressive disorder)    Osteopenia    Osteopenia    Other abnormal glucose    Other and unspecified hyperlipidemia    Pain in joint, shoulder region    Pneumonia 8032,7990   Sleep apnea    USES C-PAP   Stress incontinence, female    Past Surgical History:  Procedure Laterality Date   ABDOMINAL HYSTERECTOMY     APPENDECTOMY     BILATERAL TOTAL MASTECTOMY WITH AXILLARY LYMPH NODE DISSECTION  08/30/2013   Dr ARON   BREAST CYST ASPIRATION     9 cysts   CATARACT EXTRACTION, BILATERAL  2005/2007   CHOLECYSTECTOMY     COLONOSCOPY     COLONOSCOPY WITH PROPOFOL  N/A 11/10/2023    Procedure: COLONOSCOPY WITH PROPOFOL ;  Surgeon: Wilhelmenia Aloha Raddle., MD;  Location: [facility];  Service: Gastroenterology;  Laterality: N/A;   CYSTOCELE REPAIR     DRUG INDUCED ENDOSCOPY N/A 05/30/2024   Procedure: DRUG INDUCED SLEEP ENDOSCOPY;  Surgeon: Okey Burns, MD;  Location: [facility];  Service: ENT;  Laterality: N/A;   ENDOSCOPIC MUCOSAL RESECTION N/A 11/10/2023   Procedure: ENDOSCOPIC MUCOSAL RESECTION;  Surgeon: Wilhelmenia Aloha Raddle., MD;  Location: [facility];  Service: Gastroenterology;  Laterality: N/A;   EVACUATION BREAST HEMATOMA Left 08/31/2013   Procedure: EVACUATION HEMATOMA BREAST;  Surgeon: Jina ARON, MD;  Location: [facility];  Service: General;  Laterality: Left;   EYE SURGERY     to repair macular hole   FOOT ARTHROPLASTY     lt    GANGLION CYST EXCISION     rt foot   HEMORRHOID SURGERY     03/1993   HEMOSTASIS CLIP PLACEMENT  11/10/2023   Procedure: HEMOSTASIS CLIP PLACEMENT;  Surgeon: Wilhelmenia Aloha Raddle., MD;  Location: [facility];  Service: Gastroenterology;;   JOINT REPLACEMENT  03/15/11   left knee replacement  KNEE ARTHROSCOPY     /partial knee 2016/left knee 2012   MASS EXCISION  11/04/2011   Procedure: EXCISION MASS;  Surgeon: Lamar LULLA Leonor Mickey., MD;  Location:  S[facility]ERY [facility];  Service: Orthopedics;  Laterality: Right;  excisional biopsy right ulna mass   MASTECTOMY W/ SENTINEL NODE BIOPSY Right 08/30/2013   Procedure: RIGHT  AXILLARY SENTINEL LYMPH NODE BIOPSY; Right Axillary Node Disection;  Surgeon: Jina Nephew, MD;  Location: [facility];  Service: General;  Laterality: Right;  Right side nuc med 7:00    PARTIAL KNEE ARTHROPLASTY Right 11/03/2015   Procedure: RIGHT KNEE MEDIAL UNICOMPARTMENTAL ARTHROPLASTY ;  Surgeon: Dempsey Moan, MD;  Location: [facility];  Service: Orthopedics;  Laterality: Right;   POLYPECTOMY  11/10/2023   Procedure: POLYPECTOMY;  Surgeon: Mansouraty, Aloha Mickey., MD;  Location: [facility];   Service: Gastroenterology;;   RECTOCELE REPAIR     SIMPLE MASTECTOMY WITH AXILLARY SENTINEL NODE BIOPSY Left 08/30/2013   Procedure: Bilateral Breast Mastectomy ;  Surgeon: Jina Nephew, MD;  Location: [facility];  Service: General;  Laterality: Left;   skin tags removed     breast, panty line, neckline   SUBMUCOSAL LIFTING INJECTION  11/10/2023   Procedure: SUBMUCOSAL LIFTING INJECTION;  Surgeon: Wilhelmenia Aloha Mickey., MD;  Location: [facility];  Service: Gastroenterology;;   TOE SURGERY     preventative crossover toe surg/right foot   TOE SURGERY  2009   left foot/screw  in 2nd toe   TONSILLECTOMY     UPPER GASTROINTESTINAL ENDOSCOPY     Patient Active Problem List   Diagnosis Date Noted   Intolerance of continuous positive airway pressure (CPAP) ventilation 05/30/2024   Migraine 11/29/2023   Adenoma of ascending colon 11/10/2023   Abnormal CT scan, gastrointestinal tract 07/08/2023   Abnormal colonoscopy 07/08/2023   Hx of adenomatous colonic polyps 07/08/2023   Adenomatous polyp of ascending colon 07/08/2023   DDD (degenerative disc disease), cervical 12/29/2021   Genetic testing 08/05/2020   Fibromyalgia    MDD (major depressive disorder), recurrent, in full remission (HCC) 12/10/2019   Pulmonary fibrosis (HCC) 12/04/2018   MDD (major depressive disorder), recurrent episode, mild (HCC) 12/22/2017   Osteoporosis 07/14/2017   OA (osteoarthritis) of knee 11/03/2015   Osteopenia 08/08/2015   OSA (obstructive sleep apnea) 06/25/2015   Headache 02/03/2015   Atypical chest pain 07/15/2014   Arthralgia 02/22/2014   Psoriasis 02/22/2014   Malignant neoplasm of lower-outer quadrant of right breast of female, estrogen receptor positive (HCC) 08/09/2013   Neck pain 06/22/2013   Left knee pain 11/29/2012   Reactive depression (situational) 04/26/2012   Right wrist pain 02/02/2012   Right foot pain 10/04/2011   Nevus 06/05/2011   Status post total knee replacement 04/06/2011    Overactive bladder 03/14/2011   KNEE PAIN, LEFT 08/31/2010   HIRSUTISM 06/16/2009   Idiopathic cyst of iris, ciliary body, or anterior chamber 05/20/2008   IRRITABLE BOWEL SYNDROME 04/15/2008   Allergic asthma, mild intermittent, uncomplicated 03/19/2008   GERD 03/19/2008   History of colonic polyps 03/19/2008   NEPHROLITHIASIS, HX OF 03/19/2008   Hypothyroidism 03/18/2008   Hyperlipidemia 03/18/2008   INSOMNIA, CHRONIC 03/18/2008   FIBROMYALGIA 03/18/2008   Prediabetes 03/18/2008    REFERRING PROVIDER: Darleene Shape, NP  REFERRING DIAG:  Diagnosis  M25.562,G89.29 (ICD-10-CM) - Chronic pain of left knee   THERAPY DIAG:  Arthralgia of both lower legs  Other abnormalities of gait and mobility  Rationale for Evaluation and Treatment: Rehabilitation  ONSET DATE: 2012  SUBJECTIVE:   SUBJECTIVE STATEMENT: Both of the knees are hurting.  I am now having to use the cane again.  I haven't had to use it in years.    PERTINENT HISTORY: History of bilateral mastectomy 2014 with a right axillary node dissection removing 16 lymph nodes and 9 of those were positive. Left TKR 2012 and Rt medial compartment arthroplasty 2016, DDD, spinal stenosis, OA. Has a pool at home.   PAIN:  Are you having pain? Yes: NPRS scale: Not hurting right now.  Up to 8/10 making it hard to get out of bed.  Pain location: They hurt on both sides. Right in the joints, not in the muscles  Pain description: It depends on the day.   Aggravating factors: walking too much,  Relieving factors: absorbine veterinarian cream if really painful.    PRECAUTIONS: None  RED FLAGS: None   WEIGHT BEARING RESTRICTIONS: No  FALLS:  Has patient fallen in last 6 months? x1 yesterday I lost balance in my room and was able to get up.    LIVING ENVIRONMENT: Lives with: lives with their family and lives with their spouse Lives in: House/apartment Stairs: No Has following equipment at home: [facility], RW   OCCUPATION:  retired   PLOF: Independent with basic ADLs  PATIENT GOALS: I need to be able to take care of my husband when he has pancreatic cancer surgery.   NEXT MD VISIT: unsure   OBJECTIVE:  Note: Objective measures were completed at Evaluation unless otherwise noted.  DIAGNOSTIC FINDINGS:  05/25/24: Lt knee replacement in expected alignment, trace joint effusion  PATIENT SURVEYS:  LEFS  Extreme difficulty/unable (0), Quite a bit of difficulty (1), Moderate difficulty (2), Little difficulty (3), No difficulty (4) Survey date:    Any of your usual work, housework or school activities 4  2. Usual hobbies, recreational or sporting activities 4  3. Getting into/out of the bath 2  4. Walking between rooms 3  5. Putting on socks/shoes 4  6. Squatting  0  7. Lifting an object, like a bag of groceries from the floor 1  8. Performing light activities around your home 0  9. Performing heavy activities around your home 0  10. Getting into/out of a car 2  11. Walking 2 blocks 1  12. Walking 1 mile 1  13. Going up/down 10 stairs (1 flight) 1  14. Standing for 1 hour 1  15.  sitting for 1 hour 4  16. Running on even ground 0  17. Running on uneven ground 0  18. Making sharp turns while running fast 0  19. Hopping  0  20. Rolling over in bed 4  Score total:  32     COGNITION: Overall cognitive status: Within functional limits for tasks assessed     SENSATION: WFL  EDEMA:  Circumferential:       Rt knee above patella 51cm    Lt knee above patella 47.1cm  MUSCLE LENGTH: Hamstrings: Right 45 deg; Left 45 deg  POSTURE: rounded shoulders, forward head, and increased thoracic kyphosis  PALPATION: +2-3 ttp Left medial knee, +1 ttp Left adductors  LE MMT:   MMT Right 01/19/2022 Left 01/19/2022 06/05/24 7/22/5  Hip flexion 4/5 4/5 4 4   Hip extension 4/5 4/5    Hip abduction 4+/5 4+/5    Hip adduction 4+/5 4+/5    Hip internal rotation        Hip external rotation        Knee flexion  4+/5 4/5 4+/5 4+/5  Knee extension 4+/5 4/5 5 5   Ankle dorsiflexion        Ankle plantarflexion        Ankle inversion        Ankle eversion         (Blank rows = not tested)     FUNCTIONAL TESTS:  5 times sit to stand:   14.7s on 01/19/22      12.3s on 06/05/24  Timed up and go (TUG):  13.2s on 01/19/22      12.9s on 06/05/24  GAIT: Distance walked: from clinic room to front desk around 69ft  Assistive device utilized: Single point cane Level of assistance: Modified independence                                                                                                                               TREATMENT DATE:  06/05/24 Eval performed Discussed POC    PATIENT EDUCATION:  Education details: per today's note Person educated: Patient and Spouse Education method: Explanation Education comprehension: verbalized understanding  HOME EXERCISE PROGRAM:   ASSESSMENT:  CLINICAL IMPRESSION: Patient is a 83 y.o. female known to this clinic for lymphedema and bilateral mastectomy treatment who was seen today for physical therapy evaluation and treatment for her bilateral knee pain Lt>Rt.  She has had a bil TKR in 2012 and 2016 but has noted some more pain lately.  She has started to use the RW and [facility] more with household mobility due to pain.  She has good knee strength without pain, but does have significant tenderness to the medial knee and adductors on the left. Her circumference different is also 4 cm at the proximal knee which may indicate some effusion.  Hip strength was not tested today.  She has general hamstring tightness.  Pt also has improved her TUG and 5x STS from assessment 2 years ago which is encouraging.  Her pain goal is to improve her mobility because her husband will be undergoing pancreatic cancer treatments with possible whipple  procedure requiring her to be more functional at home as he does all the cooking and cleaning.  She also had a fall yesterday which would  require balance testing.   OBJECTIVE IMPAIRMENTS: decreased activity tolerance, decreased mobility, difficulty walking, and pain.   ACTIVITY LIMITATIONS: carrying, lifting, bending, sitting, standing, squatting, and stairs  PARTICIPATION LIMITATIONS: meal prep, cleaning, laundry, and community activity  PERSONAL FACTORS: Age, Fitness, Past/current experiences, Time since onset of injury/illness/exacerbation, and Transportation are also affecting patient's functional outcome.   REHAB POTENTIAL: Good  CLINICAL DECISION MAKING: Stable/uncomplicated  EVALUATION COMPLEXITY: Low   GOALS: Goals reviewed with patient? Yes  SHORT TERM GOALS = LTGs Target date: 07/17/24 Pt will decrease bil knee pain to 4/10 at the worst Baseline: 8/10 Goal status: INITIAL  2.  Pt will be ind with exercises to continue in her pool and at home to continue with mobility  Baseline:  Goal status: INITIAL  3.  Pt will perform BERG balance scale with goal as needed  Baseline:  Goal status: INITIAL  4.  Pt will be able to walk around her home without AD to return to PLOF  Baseline:  Goal status: INITIAL    PLAN:  PT FREQUENCY: 2x/week  PT DURATION: 6 weeks  PLANNED INTERVENTIONS: [id number]-Therapeutic exercises, 97530- Therapeutic activity, [id number]- Neuromuscular re-education, 97535- Self Care, [id number]- Manual therapy, [id number]- Gait training, [phone]- Aquatic Therapy, and Taping  PLAN FOR NEXT SESSION: pain relief including STM, PROM, MLD? For Lt knee, hamstring and adductor stretches, general knee strength, assess berg as able with goal as needed.    Larue Saddie SAUNDERS, PT 06/05/2024, 4:12 PM

## 2024-06-06 ENCOUNTER — Ambulatory Visit: Admitting: Physical Therapy

## 2024-06-06 ENCOUNTER — Encounter: Payer: Self-pay | Admitting: Physical Therapy

## 2024-06-06 DIAGNOSIS — M25562 Pain in left knee: Secondary | ICD-10-CM

## 2024-06-06 DIAGNOSIS — M25561 Pain in right knee: Secondary | ICD-10-CM | POA: Diagnosis not present

## 2024-06-06 DIAGNOSIS — R2689 Other abnormalities of gait and mobility: Secondary | ICD-10-CM

## 2024-06-06 NOTE — Therapy (Signed)
 OUTPATIENT PHYSICAL THERAPY LOWER EXTREMITY TREATMENT   Patient Name: Jean Nash MRN: [id number] DOB:1940/11/20, 83 y.o., female Today's Date: 06/06/2024  END OF SESSION:  PT End of Session - 06/06/24 1221     Visit Number 2    Number of Visits 13    Date for PT Re-Evaluation 07/17/24    Authorization Type [employer]    Progress Note Due on Visit 10    PT Start Time 1104    PT Stop Time 1145    PT Time Calculation (min) 41 min    Activity Tolerance Patient tolerated treatment well    Behavior During Therapy WFL for tasks assessed/performed           Past Medical History:  Diagnosis Date   Adenomatous colon polyp    Anxiety    PHOBIAS   Arthritis    Breast cancer (HCC) 08/08/2013   right LOQ   Cataract    Chronic insomnia    Cluster headaches    history of migraines / NONE FOR SEVERAL YRS   Depression    Diverticulosis    Fatty liver 2011   Fibromyalgia    GERD (gastroesophageal reflux disease)    H/O hiatal hernia    History of transfusion 08/30/2013   Hx of radiation therapy 10/29/13- 12/14/13   right chest wall 5040 cGy 28 sessions, right supraclavicular/axillary region 5040 cGy 28 sessions, right chest wall boost 1000 cGy 5 sessions   Hypothyroidism    Internal hemorrhoids    Irritable bowel syndrome    Kidney stone    Lymphedema    RT ARM - WEARS SLEEVE   Macular degeneration    hole/right eye   MDD (major depressive disorder)    Osteopenia    Osteopenia    Other abnormal glucose    Other and unspecified hyperlipidemia    Pain in joint, shoulder region    Pneumonia 8032,7990   Sleep apnea    USES C-PAP   Stress incontinence, female    Past Surgical History:  Procedure Laterality Date   ABDOMINAL HYSTERECTOMY     APPENDECTOMY     BILATERAL TOTAL MASTECTOMY WITH AXILLARY LYMPH NODE DISSECTION  08/30/2013   Dr ARON   BREAST CYST ASPIRATION     9 cysts   CATARACT EXTRACTION, BILATERAL  2005/2007   CHOLECYSTECTOMY     COLONOSCOPY      COLONOSCOPY WITH PROPOFOL  N/A 11/10/2023   Procedure: COLONOSCOPY WITH PROPOFOL ;  Surgeon: Wilhelmenia Aloha Raddle., MD;  Location: [facility];  Service: Gastroenterology;  Laterality: N/A;   CYSTOCELE REPAIR     DRUG INDUCED ENDOSCOPY N/A 05/30/2024   Procedure: DRUG INDUCED SLEEP ENDOSCOPY;  Surgeon: Okey Burns, MD;  Location: [facility];  Service: ENT;  Laterality: N/A;   ENDOSCOPIC MUCOSAL RESECTION N/A 11/10/2023   Procedure: ENDOSCOPIC MUCOSAL RESECTION;  Surgeon: Wilhelmenia Aloha Raddle., MD;  Location: [facility];  Service: Gastroenterology;  Laterality: N/A;   EVACUATION BREAST HEMATOMA Left 08/31/2013   Procedure: EVACUATION HEMATOMA BREAST;  Surgeon: Jina ARON, MD;  Location: [facility];  Service: General;  Laterality: Left;   EYE SURGERY     to repair macular hole   FOOT ARTHROPLASTY     lt    GANGLION CYST EXCISION     rt foot   HEMORRHOID SURGERY     03/1993   HEMOSTASIS CLIP PLACEMENT  11/10/2023   Procedure: HEMOSTASIS CLIP PLACEMENT;  Surgeon: Wilhelmenia Aloha Raddle., MD;  Location: [facility];  Service: Gastroenterology;;  JOINT REPLACEMENT  03/15/11   left knee replacement   KNEE ARTHROSCOPY     /partial knee 2016/left knee 2012   MASS EXCISION  11/04/2011   Procedure: EXCISION MASS;  Surgeon: Lamar LULLA Leonor Mickey., MD;  Location: [facility];  Service: Orthopedics;  Laterality: Right;  excisional biopsy right ulna mass   MASTECTOMY W/ SENTINEL NODE BIOPSY Right 08/30/2013   Procedure: RIGHT  AXILLARY SENTINEL LYMPH NODE BIOPSY; Right Axillary Node Disection;  Surgeon: Jina Nephew, MD;  Location: [facility];  Service: General;  Laterality: Right;  Right side nuc med 7:00    PARTIAL KNEE ARTHROPLASTY Right 11/03/2015   Procedure: RIGHT KNEE MEDIAL UNICOMPARTMENTAL ARTHROPLASTY ;  Surgeon: Dempsey Moan, MD;  Location: [facility];  Service: Orthopedics;  Laterality: Right;   POLYPECTOMY  11/10/2023   Procedure: POLYPECTOMY;  Surgeon:  Mansouraty, Aloha Mickey., MD;  Location: [facility];  Service: Gastroenterology;;   RECTOCELE REPAIR     SIMPLE MASTECTOMY WITH AXILLARY SENTINEL NODE BIOPSY Left 08/30/2013   Procedure: Bilateral Breast Mastectomy ;  Surgeon: Jina Nephew, MD;  Location: [facility];  Service: General;  Laterality: Left;   skin tags removed     breast, panty line, neckline   SUBMUCOSAL LIFTING INJECTION  11/10/2023   Procedure: SUBMUCOSAL LIFTING INJECTION;  Surgeon: Wilhelmenia Aloha Mickey., MD;  Location: [facility];  Service: Gastroenterology;;   TOE SURGERY     preventative crossover toe surg/right foot   TOE SURGERY  2009   left foot/screw  in 2nd toe   TONSILLECTOMY     UPPER GASTROINTESTINAL ENDOSCOPY     Patient Active Problem List   Diagnosis Date Noted   Intolerance of continuous positive airway pressure (CPAP) ventilation 05/30/2024   Migraine 11/29/2023   Adenoma of ascending colon 11/10/2023   Abnormal CT scan, gastrointestinal tract 07/08/2023   Abnormal colonoscopy 07/08/2023   Hx of adenomatous colonic polyps 07/08/2023   Adenomatous polyp of ascending colon 07/08/2023   DDD (degenerative disc disease), cervical 12/29/2021   Genetic testing 08/05/2020   Fibromyalgia    MDD (major depressive disorder), recurrent, in full remission (HCC) 12/10/2019   Pulmonary fibrosis (HCC) 12/04/2018   MDD (major depressive disorder), recurrent episode, mild (HCC) 12/22/2017   Osteoporosis 07/14/2017   OA (osteoarthritis) of knee 11/03/2015   Osteopenia 08/08/2015   OSA (obstructive sleep apnea) 06/25/2015   Headache 02/03/2015   Atypical chest pain 07/15/2014   Arthralgia 02/22/2014   Psoriasis 02/22/2014   Malignant neoplasm of lower-outer quadrant of right breast of female, estrogen receptor positive (HCC) 08/09/2013   Neck pain 06/22/2013   Left knee pain 11/29/2012   Reactive depression (situational) 04/26/2012   Right wrist pain 02/02/2012   Right foot pain 10/04/2011   Nevus 06/05/2011    Status post total knee replacement 04/06/2011   Overactive bladder 03/14/2011   KNEE PAIN, LEFT 08/31/2010   HIRSUTISM 06/16/2009   Idiopathic cyst of iris, ciliary body, or anterior chamber 05/20/2008   IRRITABLE BOWEL SYNDROME 04/15/2008   Allergic asthma, mild intermittent, uncomplicated 03/19/2008   GERD 03/19/2008   History of colonic polyps 03/19/2008   NEPHROLITHIASIS, HX OF 03/19/2008   Hypothyroidism 03/18/2008   Hyperlipidemia 03/18/2008   INSOMNIA, CHRONIC 03/18/2008   FIBROMYALGIA 03/18/2008   Prediabetes 03/18/2008    REFERRING PROVIDER: Darleene Shape, NP  REFERRING DIAG:  Diagnosis  M25.562,G89.29 (ICD-10-CM) - Chronic pain of left knee   THERAPY DIAG:  Other abnormalities of gait and mobility  Arthralgia of both lower legs  Rationale for Evaluation and Treatment: Rehabilitation  ONSET DATE: 2012  SUBJECTIVE:   SUBJECTIVE STATEMENT: Patient reports she is not feeling well today. She is having stomach pains.   From Eval: Both of the knees are hurting.  I am now having to use the cane again.  I haven't had to use it in years.    PERTINENT HISTORY: History of bilateral mastectomy 2014 with a right axillary node dissection removing 16 lymph nodes and 9 of those were positive. Left TKR 2012 and Rt medial compartment arthroplasty 2016, DDD, spinal stenosis, OA. Has a pool at home.   PAIN:  Are you having pain? Yes: NPRS scale: Not hurting right now.  Up to 8/10 making it hard to get out of bed.  Pain location: They hurt on both sides. Right in the joints, not in the muscles  Pain description: It depends on the day.   Aggravating factors: walking too much,  Relieving factors: absorbine veterinarian cream if really painful.    PRECAUTIONS: None  RED FLAGS: None   WEIGHT BEARING RESTRICTIONS: No  FALLS:  Has patient fallen in last 6 months? x1 yesterday I lost balance in my room and was able to get up.    LIVING ENVIRONMENT: Lives with: lives with  their family and lives with their spouse Lives in: House/apartment Stairs: No Has following equipment at home: [facility], RW   OCCUPATION: retired   PLOF: Independent with basic ADLs  PATIENT GOALS: I need to be able to take care of my husband when he has pancreatic cancer surgery.   NEXT MD VISIT: unsure   OBJECTIVE:  Note: Objective measures were completed at Evaluation unless otherwise noted.  DIAGNOSTIC FINDINGS:  05/25/24: Lt knee replacement in expected alignment, trace joint effusion  PATIENT SURVEYS:  LEFS  Extreme difficulty/unable (0), Quite a bit of difficulty (1), Moderate difficulty (2), Little difficulty (3), No difficulty (4) Survey date:    Any of your usual work, housework or school activities 4  2. Usual hobbies, recreational or sporting activities 4  3. Getting into/out of the bath 2  4. Walking between rooms 3  5. Putting on socks/shoes 4  6. Squatting  0  7. Lifting an object, like a bag of groceries from the floor 1  8. Performing light activities around your home 0  9. Performing heavy activities around your home 0  10. Getting into/out of a car 2  11. Walking 2 blocks 1  12. Walking 1 mile 1  13. Going up/down 10 stairs (1 flight) 1  14. Standing for 1 hour 1  15.  sitting for 1 hour 4  16. Running on even ground 0  17. Running on uneven ground 0  18. Making sharp turns while running fast 0  19. Hopping  0  20. Rolling over in bed 4  Score total:  32     COGNITION: Overall cognitive status: Within functional limits for tasks assessed     SENSATION: WFL  EDEMA:  Circumferential:       Rt knee above patella 51cm    Lt knee above patella 47.1cm  MUSCLE LENGTH: Hamstrings: Right 45 deg; Left 45 deg  POSTURE: rounded shoulders, forward head, and increased thoracic kyphosis  PALPATION: +2-3 ttp Left medial knee, +1 ttp Left adductors  LE MMT:   MMT Right 01/19/2022 Left 01/19/2022 06/05/24 7/22/5  Hip flexion 4/5 4/5 4 4   Hip extension 4/5  4/5    Hip abduction 4+/5 4+/5  Hip adduction 4+/5 4+/5    Hip internal rotation        Hip external rotation        Knee flexion 4+/5 4/5 4+/5 4+/5  Knee extension 4+/5 4/5 5 5   Ankle dorsiflexion        Ankle plantarflexion        Ankle inversion        Ankle eversion         (Blank rows = not tested)     FUNCTIONAL TESTS:  5 times sit to stand:   14.7s on 01/19/22      12.3s on 06/05/24  Timed up and go (TUG):  13.2s on 01/19/22      12.9s on 06/05/24  GAIT: Distance walked: from clinic room to front desk around 68ft  Assistive device utilized: Single point cane Level of assistance: Modified independence                                                                                                                               TREATMENT DATE:  06/06/2024 Seated knee flexion with slider x 12 bilateral 2 sec hold Hamstring stretch 2 x 30 sec  Seated LAQ 2# x 8 bilateral 0# x 10 Seated clam with red TB x 20 Standing heel raise x 10 Standing hip abduction x 10 bilateral  Weight shifts on airex  x 2 mins Sit to stand 2 x 5 3#  06/05/24 Eval performed Discussed POC    PATIENT EDUCATION:  Education details: per today's note Person educated: Patient and Spouse Education method: Explanation Education comprehension: verbalized understanding  HOME EXERCISE PROGRAM: Access Code: [email or web address] URL: [email or web address] Date: 06/06/2024 Prepared by: Kristeen Sar  Exercises - Seated Hamstring Stretch  - 1 x daily - 7 x weekly - 2 sets - 30 hold - Seated Long Arc Quad  - 1 x daily - 7 x weekly - 2 sets - 10 reps - 3 hold - Seated Hip Abduction with Resistance  - 1 x daily - 7 x weekly - 2 sets - 10 reps  ASSESSMENT:  CLINICAL IMPRESSION: Claris presents to first follow up appointment since evaluation. Initiated HEP to include functional strengthening and hip flexibility exercises. PT provided verbal and visual cues for correct exercise performance. She tolerated  treatment session well. She verbalized increased pain after performing 8 reps of LAQ with weight , so stopped at 8 reps. Patient should respond well to skilled therapy.  OBJECTIVE IMPAIRMENTS: decreased activity tolerance, decreased mobility, difficulty walking, and pain.   ACTIVITY LIMITATIONS: carrying, lifting, bending, sitting, standing, squatting, and stairs  PARTICIPATION LIMITATIONS: meal prep, cleaning, laundry, and community activity  PERSONAL FACTORS: Age, Fitness, Past/current experiences, Time since onset of injury/illness/exacerbation, and Transportation are also affecting patient's functional outcome.   REHAB POTENTIAL: Good  CLINICAL DECISION MAKING: Stable/uncomplicated  EVALUATION COMPLEXITY: Low   GOALS: Goals reviewed with patient? Yes  SHORT TERM GOALS = LTGs Target date: 07/17/24  Pt will decrease bil knee pain to 4/10 at the worst Baseline: 8/10 Goal status: INITIAL  2.  Pt will be ind with exercises to continue in her pool and at home to continue with mobility  Baseline:  Goal status: INITIAL  3.  Pt will perform BERG balance scale with goal as needed  Baseline:  Goal status: INITIAL  4.  Pt will be able to walk around her home without AD to return to PLOF  Baseline:  Goal status: INITIAL    PLAN:  PT FREQUENCY: 2x/week  PT DURATION: 6 weeks  PLANNED INTERVENTIONS: 97110-Therapeutic exercises, 97530- Therapeutic activity, [id number]- Neuromuscular re-education, 97535- Self Care, [id number]- Manual therapy, [id number]- Gait training, [id number]- Aquatic Therapy, and Taping  PLAN FOR NEXT SESSION: assess tolerance to treatment session & HEP; pain relief including STM, PROM, MLD?  general knee strength, assess berg as able with goal as needed.    Kristeen Sar, PT 06/06/24 12:31 PM

## 2024-06-12 ENCOUNTER — Encounter (INDEPENDENT_AMBULATORY_CARE_PROVIDER_SITE_OTHER): Payer: Self-pay | Admitting: Otolaryngology

## 2024-06-13 ENCOUNTER — Ambulatory Visit: Admitting: Rehabilitation

## 2024-06-15 NOTE — Progress Notes (Signed)
 NEUROPSYCHOLOGICAL EVALUATION [facility]  Physical Medicine and Rehabilitation     Patient: Jean Nash  MRN: [id number] DOB: 1940-12-17  Age: 83 Sex: female  Race/Ethnicity: White or Caucasian  Years of Education: 13 Handedness: Right  Collateral Information Source: Husband Jean Nash)  Referring Provider: Carvin Arvella HERO, MD  Provider/Clinical Neuropsychologist: Evalene DOROTHA Riff, PsyD  Date of Service: 04/02/2024 Start Time: 3 PM End Time: 5 PM  Location of Service:  [name] Physical Medicine & Rehabilitation Department [place] [address] [place]. [address] [place], [place] [address] Phone: [phone]  Billing Code/Service:            96116/96121  Individuals Present: Patient was seen, accompanied by her husband with her permission, in-person, by the provider. 1 hour and 15 minutes spent in face-to-face clinical interview and remaining 45 minutes was spent in record review, documentation, and testing protocol construction.    PATIENT CONSENT AND CONFIDENTIALITY The patient's understanding of the reason for referral was intact. Discussed limits of confidentiality including, but not limited to, posting of final evaluation report in the patient's electronic medical record for both the patient and for the referring provider and appropriate medical professionals. Patient was given the opportunity to have their questions answered. The neuropsychological evaluation process was discussed with the patient and they consented to proceed with the evaluation.  Consent for Evaluation and Treatment: Signed: Yes Explanation of Privacy Policies: Signed: Yes Discussion of Confidentiality Limits: Yes  REASON FOR REFERRAL:The patient is an 83 year old woman referred for neuropsychological evaluation due to concerns for cognitive decline.   Per records, the patient met with neurology (Dr. Rosemarie) in April of 2024 due to concerns for memory  loss. Records from that visit indicated past medical history of hypothyroidism, hyperlipidemia, insomnia, hearing loss, gastroesophageal reflux disease, irritable bowel syndrome, overactive bladder, history of breast cancer, fibromyalgia. She was reported to have migraines (lifelong Hx), treated with Topamax  25mg  daily. Memory difficulties were reported to have become noticeable to the patient in 2020 after COVID. Mini-Mental status exam from March 2024 was 28/30. 02/15/2019 MRI scan of the brain was reported to show chronic small vessel disease and old small bilateral lacunar subcortical infarcts. No prior history of focal neurological deficits, per visit HPI. No known family history of Alzheimer's dementia. Within the assessment section of visit notes, Dr. Rosemarie stated memory and mild cognitive difficulties which likely is a combination of age-related mild cognitive impairment and silent cerebrovascular disease. Further work-up was planned. EEG was reportedly normal, per records. Per communications with the patient documented in EMR; The MRA study of the blood vessels of the brain did not show any major blockages to worry about.  She was seen by her psychiatrist, Dr. Carvin, on 02/28/24 and referred for neuropsychological evaluation due to concerns for cognitive decline. Per a more recent visit note (05/01/24), she scored a 25 out of 30. She had deficiencies in delayed recall and orientation.  Visit notes indicated At initial evaluation patient reported a history of MDD but endorsed primarily increased fatigue. Possible contributions due to medication (pregabalin ) were noted given temporal correlate. Chronic pain was reported to be present and benefiting from the medication, however. Difficulties with fatigue, sleep, memory concerns, and amotivation were reported. She was diagnosed with severe sleep apnea per recent sleep study.  She is not on CPAP and was referred to ENT to look into inspire procedure. Dr.  Carvin notes that described symptoms could be accounted for by her untreated  severe sleep apnea and benefit from treating the apnea.   Upon interview, the patient and her spouse indicated that they were interested in undergoing the evaluation to determine what was likely underlying her short-term memory difficulties and what to expect in the future.  HISTORY OF PRESENTING CONCERNS:  Cognitive Symptom Onset & Course: Upon interview, symptom onset was described as most noticeable over the last 6 months but possibly up to a year. Course was suspected to be progressive  Current Cognitive Complaints:  Memory: Endorsed difficulties with forgetting conversations, recent events, occasionally unintentionally repeating oneself.  Cueing is reportedly beneficial somewhat to recall.  Denied problems with frequently misplacing things.  Tracks appointments via my chart and calendar.  Little bit of difficulty with medications and more recent months. Processing Speed: No significant changes. Attention & Concentration: Difficulties losing train of thought are noted as a prominent difficulty.  General changes in attention and concentration as well. Language: No significant expressive or receptive language difficulties noted. Visual-Spatial: No significant indications of visual-spatial difficulties. Executive Functioning: No significant changes or problems with decision making and problem solving.  No concerns regarding basic judgment or marked changes in behavior or personality.  Motor/Sensory Complaints:   Sensory changes: Reportedly lost her sense of taste years ago. Difficulty with vision in right eye but left eye is reportedly good. Balance/coordination difficulties: Balance difficulties suspected to be related to knee problems. Frequent instances of dizziness/vertigo: Denied. Other motor difficulties: Isolated instances of tremor  Emotional and Behavioral Functioning: The patient indicated that her mood  is typically good unless she is experiencing a headache.  She describes some feelings of guilt about not being able to do more.  She denied any problems with frequent feelings of sadness or low mood.  She denied any problems with frequent/difficult to control worry.  No current or past suicidal ideation, homicidal ideation, paranoia, hallucinations, symptoms of mania, and no history of psychiatric admission.  As noted, continues to work with psychiatry.  Sleep: Variable sleep quality.  Had previously been using CPAP but stopped after losing a considerable amount of weight.  Was waiting for results from sleep study at the time of initial evaluation.  Generally variable sleep quality.  No indications of RBD's Estimated hours obtained each night:  Appetite: Reportedly good. Caffeine : Minimal. Alcohol Use: None. Tobacco Use: Remote history (50+ years ago) of minimal (0.1 pack/day for 2 years) tobacco use.  Recreational Substance Use: No   Level of Functional Independence: The patient is intact in basic activities of daily living from a cognitive perspective. Some possible minor difficulties with instrumental daily activities . Some difficulties related to physical health problems.    Medical History/Record Review: Per records/Patient report History of traumatic brain injury/concussion: No History of stroke: No History of heart attack: No History of cancer/chemotherapy: No History of seizure activity: No Symptoms of chronic pain: Fairly consistent chronic pain but limited impact on functioning.   Past Medical History:  Diagnosis Date   Adenomatous colon polyp    Anxiety    PHOBIAS   Arthritis    Breast cancer (HCC) 08/08/2013   right LOQ   Cataract    Chronic insomnia    Cluster headaches    history of migraines / NONE FOR SEVERAL YRS   Depression    Diverticulosis    Fatty liver 2011   Fibromyalgia    GERD (gastroesophageal reflux disease)    H/O hiatal hernia    History of  transfusion 08/30/2013   Hx of radiation  therapy 10/29/13- 12/14/13   right chest wall 5040 cGy 28 sessions, right supraclavicular/axillary region 5040 cGy 28 sessions, right chest wall boost 1000 cGy 5 sessions   Hypothyroidism    Internal hemorrhoids    Irritable bowel syndrome    Kidney stone    Lymphedema    RT ARM - WEARS SLEEVE   Macular degeneration    hole/right eye   MDD (major depressive disorder)    Osteopenia    Osteopenia    Other abnormal glucose    Other and unspecified hyperlipidemia    Pain in joint, shoulder region    Pneumonia 8032,7990   Sleep apnea    USES C-PAP   Stress incontinence, female    Patient Active Problem List   Diagnosis Date Noted   Intolerance of continuous positive airway pressure (CPAP) ventilation 05/30/2024   Migraine 11/29/2023   Adenoma of ascending colon 11/10/2023   Abnormal CT scan, gastrointestinal tract 07/08/2023   Abnormal colonoscopy 07/08/2023   Hx of adenomatous colonic polyps 07/08/2023   Adenomatous polyp of ascending colon 07/08/2023   DDD (degenerative disc disease), cervical 12/29/2021   Genetic testing 08/05/2020   Fibromyalgia    MDD (major depressive disorder), recurrent, in full remission (HCC) 12/10/2019   Pulmonary fibrosis (HCC) 12/04/2018   MDD (major depressive disorder), recurrent episode, mild (HCC) 12/22/2017   Osteoporosis 07/14/2017   OA (osteoarthritis) of knee 11/03/2015   Osteopenia 08/08/2015   OSA (obstructive sleep apnea) 06/25/2015   Headache 02/03/2015   Atypical chest pain 07/15/2014   Arthralgia 02/22/2014   Psoriasis 02/22/2014   Malignant neoplasm of lower-outer quadrant of right breast of female, estrogen receptor positive (HCC) 08/09/2013   Neck pain 06/22/2013   Left knee pain 11/29/2012   Reactive depression (situational) 04/26/2012   Right wrist pain 02/02/2012   Right foot pain 10/04/2011   Nevus 06/05/2011   Status post total knee replacement 04/06/2011   Overactive bladder  03/14/2011   KNEE PAIN, LEFT 08/31/2010   HIRSUTISM 06/16/2009   Idiopathic cyst of iris, ciliary body, or anterior chamber 05/20/2008   IRRITABLE BOWEL SYNDROME 04/15/2008   Allergic asthma, mild intermittent, uncomplicated 03/19/2008   GERD 03/19/2008   History of colonic polyps 03/19/2008   NEPHROLITHIASIS, HX OF 03/19/2008   Hypothyroidism 03/18/2008   Hyperlipidemia 03/18/2008   INSOMNIA, CHRONIC 03/18/2008   FIBROMYALGIA 03/18/2008   Prediabetes 03/18/2008    Imaging/Lab Results: Per records, 02/15/2023 EXAM: MRI HEAD WITHOUT CONTRAST COMPARISON: Head CT 07/28/2017. Brain MRI 02/08/2015.  FINDINGS: Brain: Mild for age generalized parenchymal atrophy. Multifocal T2 FLAIR hyperintense signal abnormality within the cerebral white matter and pons, nonspecific but compatible with moderate chronic small vessel ischemic disease. Small T2 hyperintense foci within the bilateral deep gray nuclei, reflecting a combination of prominent perivascular spaces and chronic lacunar infarcts, progressed in number from the prior MRI of 02/08/2015. Partially empty sella turcica.  There is no acute infarct.  No evidence of an intracranial mass. No chronic intracranial blood products. No extra-axial fluid collection. No midline shift. Vascular: Maintained flow voids within the proximal large arterial vessels. Skull and upper cervical spine: No focal suspicious marrow lesion. Incompletely assessed cervical spondylosis. Sinuses/Orbits: No mass or acute finding within the imaged orbits. Prior bilateral ocular lens replacement. Tiny mucous retention cyst within the left maxillary sinus. Trace mucosal thickening within bilateral ethmoid air cells. Other: Trace fluid within the bilateral mastoid air cells.   IMPRESSION: 1.  No evidence of an acute intracranial abnormality. 2. Moderate chronic small  vessel ischemic changes within the cerebral white matter and pons, progressed from the prior  brain MRI of 02/08/2015. 3. Chronic lacunar infarcts within bilateral deep gray nuclei, progressed in number from the prior MRI. 4. Mild generalized parenchymal atrophy.   Family Neurologic/Medical Hx:  Family History  Problem Relation Age of Onset   Stroke Mother        died age 74   Diabetes Mother    Breast cancer Mother 65   Breast cancer Sister 59   Brain cancer Maternal Uncle 8   Breast cancer Paternal Aunt 8   Breast cancer Paternal Aunt        dx in her 51s   Cancer Maternal Grandmother        intra-abdominal cancer   Diabetes Maternal Grandfather    Breast cancer Paternal Grandmother 39   Brain cancer Cousin 55       maternal cousin   Brain cancer Cousin 20       paternal cousin   Colon cancer Neg Hx    Colon polyps Neg Hx    Crohn's disease Neg Hx    Esophageal cancer Neg Hx    Rectal cancer Neg Hx    Stomach cancer Neg Hx    Ulcerative colitis Neg Hx    Inflammatory bowel disease Neg Hx    Liver disease Neg Hx    Pancreatic cancer Neg Hx     Medications: (Per 06/15/24) chlorproMAZINE (THORAZINE) 25 MG tablet DULoxetine  (CYMBALTA ) 30 MG capsule esomeprazole  (NEXIUM ) 40 MG capsule famotidine  (PEPCID ) 40 MG tablet glucose blood (EMBRACE PRO GLUCOSE TEST) test strip levothyroxine  (SYNTHROID ) 100 MCG tablet omega-3 acid ethyl esters (LOVAZA ) 1 g capsule  Academic/Vocational History: 13 years of education (one year college). Was in some advanced courses/special-progress. No learning difficulties.  Employment: Retired Catering manager   Psychosocial: Marital Status: Married 60 years Children: Three Living Situation: Lives with spouse. Has social supports nearby.   Mental Status/Behavioral Observations: The patient was seen on an outpatient basis in the [facility] PM&R office for the clinical interview accompanied by her spouse.  Sensorium/Arousal: Alert. Hearing and vision adequate for the purpose of the evaluation.  Orientation: Full Appearance: Appropriate  dress and hygiene Behavior: Attentive and cooperative Speech/Language: Conversational speech was prosodic, fluent, and well-articulated.  No clear indications of impairments in receptive language based on behavioral observations. Social Comportment: Appropriate for the setting Mood: Euthymic Affect: Congruent Thought Process/Content: Coherent, linear, goal directed. Ability to Participate in Interview: Readily answered all questions posed during the interview with adequate detail about personal history. Insight:Good   SUMMARY / CLINICAL IMPRESSIONS The patient is an 83 year old woman referred for neuropsychological evaluation due to concerns for cognitive decline. Per records, the patient met with neurology (Dr. Rosemarie) in April of 2024 due to concerns for memory loss. Records from that visit indicated past medical history of hypothyroidism, hyperlipidemia, insomnia, hearing loss, gastroesophageal reflux disease, irritable bowel syndrome, overactive bladder, history of breast cancer, fibromyalgia. She was reported to have migraines (lifelong Hx), treated with Topamax  25mg  daily. Memory difficulties were reported to have become noticeable to the patient in 2020 after COVID. Mini-Mental status exam from March 2024 was 28/30. 02/15/2019 MRI scan of the brain was reported to show chronic small vessel disease and old small bilateral lacunar subcortical infarcts. No prior history of focal neurological deficits, per visit HPI. No known family history of Alzheimer's dementia. Within the assessment section of visit notes, Dr. Rosemarie stated memory and mild cognitive difficulties which likely is  a combination of age-related mild cognitive impairment and silent cerebrovascular disease. Further work-up was planned. EEG was reportedly normal, per records. Per communications with the patient documented in EMR; The MRA study of the blood vessels of the brain did not show any major blockages to worry about.  She was  seen by her psychiatrist, Dr. Carvin, on 02/28/24 and referred for neuropsychological evaluation due to concerns for cognitive decline. Per a more recent visit note (05/01/24), she scored a 25 out of 30. She had deficiencies in delayed recall and orientation.  Visit notes indicated At initial evaluation patient reported a history of MDD but endorsed primarily increased fatigue. Possible contributions due to medication (pregabalin ) were noted given temporal correlate. Chronic pain was reported to be present and benefiting from the medication, however. Difficulties with fatigue, sleep, memory concerns, and amotivation were reported. She was diagnosed with severe sleep apnea per recent sleep study.  She is not on CPAP and was referred to ENT to look into inspire procedure. Dr. Carvin notes that described symptoms could be accounted for by her untreated severe sleep apnea and benefit from treating the apnea.   Upon interview, the patient and her spouse indicated that they were interested in undergoing the evaluation to determine what was likely underlying her short-term memory difficulties and what to expect in the future.  Subjective cognitive complaints involve primarily short-term memory and attention/concentration.  Psychiatric symptoms are relatively minimal overall.  A formal evaluation is warranted based on the above information and her clinical history to determine the patient's current cognitive functioning, facilitate differential diagnosis, and guide treatment planning.   DISPOSITION / PLAN As discussed with the patient and her husband, untreated sleep apnea (reportedly severe, per records following the original neuropsychological evaluation interview) is something which can certainly impact cognitive abilities both subjectively and on cognitive testing. Treatment of the apnea is strongly recommended in general (given the relationship between sleep apnea, cardiovascular health, cerebrovascular health,  and even life expectancy of treated vs untreated sleep apnea). Treatment of sleep apnea is also relevant to neuropsyhchological testing, and treating the condition before proceeding would be ideal. Areas likely to be impacted by untreated sleep apnea have problematic overlap with cognitive deficits seen in other neurological conditions. On cognitive tests, cognitive deficits commonly seen in cerebrovascular disease overlap heavily with cognitive deficits expected with untreated sleep apnea. The risk of etiologically ambiguous test results would be reduced substantially by treating the sleep apnea before proceeding with testing. The patient and her spouse were amenable to the providers recommendations that testing be postponed until the sleep apnea could be addressed. If there are any marked and sudden cognitive declines that emerge in the interim, and proceeding with testing sooner is desired, the provider is certainly willing to do so. Absent and emergent need, the clinical value of neuropsychological testing will benefit from sleep apnea treatment prior to testing.   Per interim record review (06/15/24) the patient appears to be continuing to work with medical providers for treatment of sleep apnea. As originally discussed during the intake interview, the patient and spouse (or any treating provider) are encouraged to reach out to schedule the cognitive test portion of the evaluation once things have resolved with the apnea treatment.   Please don't hesitate to reach out with any questions in the interim.    Diagnosis: Cognitive Changes              Evalene DOROTHA Riff, PsyD  Neuropsychologist    This report was generated using voice recognition software. While this document has been carefully reviewed, transcription errors may be present. I apologize in advance for any inconvenience. Please contact me if further clarification is needed.

## 2024-06-20 ENCOUNTER — Ambulatory Visit: Attending: Adult Health | Admitting: Rehabilitation

## 2024-06-20 ENCOUNTER — Encounter: Payer: Self-pay | Admitting: Rehabilitation

## 2024-06-20 DIAGNOSIS — M25561 Pain in right knee: Secondary | ICD-10-CM | POA: Insufficient documentation

## 2024-06-20 DIAGNOSIS — M25562 Pain in left knee: Secondary | ICD-10-CM | POA: Diagnosis present

## 2024-06-20 DIAGNOSIS — R2689 Other abnormalities of gait and mobility: Secondary | ICD-10-CM | POA: Diagnosis present

## 2024-06-20 NOTE — Therapy (Signed)
 OUTPATIENT PHYSICAL THERAPY LOWER EXTREMITY TREATMENT   Patient Name: Jean Nash MRN: [id number] DOB:Jul 14, 1941, 83 y.o., female Today's Date: 06/20/2024  END OF SESSION:  PT End of Session - 06/20/24 1150     Visit Number 3    Number of Visits 13    Date for PT Re-Evaluation 07/17/24    PT Start Time 1101    PT Stop Time 1150    PT Time Calculation (min) 49 min    Activity Tolerance Patient tolerated treatment well    Behavior During Therapy WFL for tasks assessed/performed            Past Medical History:  Diagnosis Date   Adenomatous colon polyp    Anxiety    PHOBIAS   Arthritis    Breast cancer (HCC) 08/08/2013   right LOQ   Cataract    Chronic insomnia    Cluster headaches    history of migraines / NONE FOR SEVERAL YRS   Depression    Diverticulosis    Fatty liver 2011   Fibromyalgia    GERD (gastroesophageal reflux disease)    H/O hiatal hernia    History of transfusion 08/30/2013   Hx of radiation therapy 10/29/13- 12/14/13   right chest wall 5040 cGy 28 sessions, right supraclavicular/axillary region 5040 cGy 28 sessions, right chest wall boost 1000 cGy 5 sessions   Hypothyroidism    Internal hemorrhoids    Irritable bowel syndrome    Kidney stone    Lymphedema    RT ARM - WEARS SLEEVE   Macular degeneration    hole/right eye   MDD (major depressive disorder)    Osteopenia    Osteopenia    Other abnormal glucose    Other and unspecified hyperlipidemia    Pain in joint, shoulder region    Pneumonia 8032,7990   Sleep apnea    USES C-PAP   Stress incontinence, female    Past Surgical History:  Procedure Laterality Date   ABDOMINAL HYSTERECTOMY     APPENDECTOMY     BILATERAL TOTAL MASTECTOMY WITH AXILLARY LYMPH NODE DISSECTION  08/30/2013   Dr ARON   BREAST CYST ASPIRATION     9 cysts   CATARACT EXTRACTION, BILATERAL  2005/2007   CHOLECYSTECTOMY     COLONOSCOPY     COLONOSCOPY WITH PROPOFOL  N/A 11/10/2023   Procedure: COLONOSCOPY  WITH PROPOFOL ;  Surgeon: Wilhelmenia Aloha Raddle., MD;  Location: [facility];  Service: Gastroenterology;  Laterality: N/A;   CYSTOCELE REPAIR     DRUG INDUCED ENDOSCOPY N/A 05/30/2024   Procedure: DRUG INDUCED SLEEP ENDOSCOPY;  Surgeon: Okey Burns, MD;  Location:  [facility];  Service: ENT;  Laterality: N/A;   ENDOSCOPIC MUCOSAL RESECTION N/A 11/10/2023   Procedure: ENDOSCOPIC MUCOSAL RESECTION;  Surgeon: Wilhelmenia Aloha Raddle., MD;  Location: [facility];  Service: Gastroenterology;  Laterality: N/A;   EVACUATION BREAST HEMATOMA Left 08/31/2013   Procedure: EVACUATION HEMATOMA BREAST;  Surgeon: Jina ARON, MD;  Location: [facility];  Service: General;  Laterality: Left;   EYE SURGERY     to repair macular hole   FOOT ARTHROPLASTY     lt    GANGLION CYST EXCISION     rt foot   HEMORRHOID SURGERY     03/1993   HEMOSTASIS CLIP PLACEMENT  11/10/2023   Procedure: HEMOSTASIS CLIP PLACEMENT;  Surgeon: Wilhelmenia Aloha Raddle., MD;  Location: [facility];  Service: Gastroenterology;;   JOINT REPLACEMENT  03/15/11   left knee replacement   KNEE ARTHROSCOPY     /  partial knee 2016/left knee 2012   MASS EXCISION  11/04/2011   Procedure: EXCISION MASS;  Surgeon: Lamar LULLA Leonor Mickey., MD;  Location: [facility];  Service: Orthopedics;  Laterality: Right;  excisional biopsy right ulna mass   MASTECTOMY W/ SENTINEL NODE BIOPSY Right 08/30/2013   Procedure: RIGHT  AXILLARY SENTINEL LYMPH NODE BIOPSY; Right Axillary Node Disection;  Surgeon: Jina Nephew, MD;  Location: [facility];  Service: General;  Laterality: Right;  Right side nuc med 7:00    PARTIAL KNEE ARTHROPLASTY Right 11/03/2015   Procedure: RIGHT KNEE MEDIAL UNICOMPARTMENTAL ARTHROPLASTY ;  Surgeon: Dempsey Moan, MD;  Location: [facility];  Service: Orthopedics;  Laterality: Right;   POLYPECTOMY  11/10/2023   Procedure: POLYPECTOMY;  Surgeon: Mansouraty, Aloha Mickey., MD;  Location: [facility];  Service:  Gastroenterology;;   RECTOCELE REPAIR     SIMPLE MASTECTOMY WITH AXILLARY SENTINEL NODE BIOPSY Left 08/30/2013   Procedure: Bilateral Breast Mastectomy ;  Surgeon: Jina Nephew, MD;  Location: [facility];  Service: General;  Laterality: Left;   skin tags removed     breast, panty line, neckline   SUBMUCOSAL LIFTING INJECTION  11/10/2023   Procedure: SUBMUCOSAL LIFTING INJECTION;  Surgeon: Wilhelmenia Aloha Mickey., MD;  Location: [facility];  Service: Gastroenterology;;   TOE SURGERY     preventative crossover toe surg/right foot   TOE SURGERY  2009   left foot/screw  in 2nd toe   TONSILLECTOMY     UPPER GASTROINTESTINAL ENDOSCOPY     Patient Active Problem List   Diagnosis Date Noted   Intolerance of continuous positive airway pressure (CPAP) ventilation 05/30/2024   Migraine 11/29/2023   Adenoma of ascending colon 11/10/2023   Abnormal CT scan, gastrointestinal tract 07/08/2023   Abnormal colonoscopy 07/08/2023   Hx of adenomatous colonic polyps 07/08/2023   Adenomatous polyp of ascending colon 07/08/2023   DDD (degenerative disc disease), cervical 12/29/2021   Genetic testing 08/05/2020   Fibromyalgia    MDD (major depressive disorder), recurrent, in full remission (HCC) 12/10/2019   Pulmonary fibrosis (HCC) 12/04/2018   MDD (major depressive disorder), recurrent episode, mild (HCC) 12/22/2017   Osteoporosis 07/14/2017   OA (osteoarthritis) of knee 11/03/2015   Osteopenia 08/08/2015   OSA (obstructive sleep apnea) 06/25/2015   Headache 02/03/2015   Atypical chest pain 07/15/2014   Arthralgia 02/22/2014   Psoriasis 02/22/2014   Malignant neoplasm of lower-outer quadrant of right breast of female, estrogen receptor positive (HCC) 08/09/2013   Neck pain 06/22/2013   Left knee pain 11/29/2012   Reactive depression (situational) 04/26/2012   Right wrist pain 02/02/2012   Right foot pain 10/04/2011   Nevus 06/05/2011   Status post total knee replacement 04/06/2011   Overactive  bladder 03/14/2011   KNEE PAIN, LEFT 08/31/2010   HIRSUTISM 06/16/2009   Idiopathic cyst of iris, ciliary body, or anterior chamber 05/20/2008   IRRITABLE BOWEL SYNDROME 04/15/2008   Allergic asthma, mild intermittent, uncomplicated 03/19/2008   GERD 03/19/2008   History of colonic polyps 03/19/2008   NEPHROLITHIASIS, HX OF 03/19/2008   Hypothyroidism 03/18/2008   Hyperlipidemia 03/18/2008   INSOMNIA, CHRONIC 03/18/2008   FIBROMYALGIA 03/18/2008   Prediabetes 03/18/2008    REFERRING PROVIDER: Darleene Shape, NP  REFERRING DIAG:  Diagnosis  M25.562,G89.29 (ICD-10-CM) - Chronic pain of left knee   THERAPY DIAG:  Other abnormalities of gait and mobility  Arthralgia of both lower legs  Rationale for Evaluation and Treatment: Rehabilitation  ONSET DATE: 2012  SUBJECTIVE:   SUBJECTIVE STATEMENT: Pts  daughter reports she did not do her exercises at all.  But she reports not doing her cane or RW and the knee has not hurt her lately.     From Eval: Both of the knees are hurting.  I am now having to use the cane again.  I haven't had to use it in years.    PERTINENT HISTORY: History of bilateral mastectomy 2014 with a right axillary node dissection removing 16 lymph nodes and 9 of those were positive. Left TKR 2012 and Rt medial compartment arthroplasty 2016, DDD, spinal stenosis, OA. Has a pool at home.   PAIN:  Are you having pain? No: NPRS scale:   Pain location: They hurt on both sides. Right in the joints, not in the muscles  Pain description: It depends on the day.   Aggravating factors: walking too much,  Relieving factors: absorbine veterinarian cream if really painful.    PRECAUTIONS: None  RED FLAGS: None   WEIGHT BEARING RESTRICTIONS: No  FALLS:  Has patient fallen in last 6 months? x1 yesterday I lost balance in my room and was able to get up.    LIVING ENVIRONMENT: Lives with: lives with their family and lives with their spouse Lives in:  House/apartment Stairs: No Has following equipment at home: [facility], RW   OCCUPATION: retired   PLOF: Independent with basic ADLs  PATIENT GOALS: I need to be able to take care of my husband when he has pancreatic cancer surgery.   NEXT MD VISIT: unsure   OBJECTIVE:  Note: Objective measures were completed at Evaluation unless otherwise noted.  DIAGNOSTIC FINDINGS:  05/25/24: Lt knee replacement in expected alignment, trace joint effusion  PATIENT SURVEYS:  LEFS  Extreme difficulty/unable (0), Quite a bit of difficulty (1), Moderate difficulty (2), Little difficulty (3), No difficulty (4) Survey date:    Any of your usual work, housework or school activities 4  2. Usual hobbies, recreational or sporting activities 4  3. Getting into/out of the bath 2  4. Walking between rooms 3  5. Putting on socks/shoes 4  6. Squatting  0  7. Lifting an object, like a bag of groceries from the floor 1  8. Performing light activities around your home 0  9. Performing heavy activities around your home 0  10. Getting into/out of a car 2  11. Walking 2 blocks 1  12. Walking 1 mile 1  13. Going up/down 10 stairs (1 flight) 1  14. Standing for 1 hour 1  15.  sitting for 1 hour 4  16. Running on even ground 0  17. Running on uneven ground 0  18. Making sharp turns while running fast 0  19. Hopping  0  20. Rolling over in bed 4  Score total:  32     COGNITION: Overall cognitive status: Within functional limits for tasks assessed     SENSATION: WFL  EDEMA:  Circumferential:       Rt knee above patella 51cm    Lt knee above patella 47.1cm  MUSCLE LENGTH: Hamstrings: Right 45 deg; Left 45 deg  POSTURE: rounded shoulders, forward head, and increased thoracic kyphosis  PALPATION: +2-3 ttp Left medial knee, +1 ttp Left adductors  LE MMT:   MMT Right 01/19/2022 Left 01/19/2022 06/05/24 7/22/5  Hip flexion 4/5 4/5 4 4   Hip extension 4/5 4/5    Hip abduction 4+/5 4+/5    Hip adduction  4+/5 4+/5    Hip internal rotation  Hip external rotation        Knee flexion 4+/5 4/5 4+/5 4+/5  Knee extension 4+/5 4/5 5 5   Ankle dorsiflexion        Ankle plantarflexion        Ankle inversion        Ankle eversion         (Blank rows = not tested)     FUNCTIONAL TESTS:  5 times sit to stand:   14.7s on 01/19/22      12.3s on 06/05/24  Timed up and go (TUG):  13.2s on 01/19/22      12.9s on 06/05/24  GAIT: Distance walked: from clinic room to front desk around 75ft  Assistive device utilized: Single point cane Level of assistance: Modified independence                                                                                                                               TREATMENT DATE:  06/20/2024 Completed BERG with 55/56 score - reviewed score and fall risk Daughter requested that we go over HEP with instruction and review on form Hamstring stretch 2 x 30 sec bil Seated LAQ 2# x 10bil Seated clam with red TB x 20 - gave blue for progression In parallel bars:  Standing hip abduction 2# x 10 bilateral  Standing hip extension 2# x 10 bil  Weight shifts on airex  x 2 mins SL stance work on airex too hard, switched to work on [employer]  06/06/2024 Seated knee flexion with slider x 12 bilateral 2 sec hold Hamstring stretch 2 x 30 sec  Seated LAQ 2# x 8 bilateral 0# x 10 Seated clam with red TB x 20 Standing heel raise x 10 Standing hip abduction x 10 bilateral  Weight shifts on airex  x 2 mins Sit to stand 2 x 5 3#  06/05/24 Eval performed Discussed POC    PATIENT EDUCATION:  Education details: per today's note Person educated: Patient and Spouse Education method: Explanation Education comprehension: verbalized understanding  HOME EXERCISE PROGRAM: Access Code: [email or web address] URL: [email or web address] Date: 06/20/2024 Prepared by: Saddie Raw  Exercises - Seated Hamstring Stretch  - 1 x daily - 7 x weekly - 2 sets - 30 hold - Seated Long  Arc Quad  - 1 x daily - 7 x weekly - 2 sets - 10 reps - 3 hold - Seated Hip Abduction with Resistance  - 1 x daily - 7 x weekly - 2 sets - 10 reps - Standing Hip Abduction with Counter Support  - 1 x daily - 7 x weekly - 1-3 sets - 10 reps - no hold - Standing Hip Extension with Counter Support  - 1 x daily - 7 x weekly - 1-3 sets - 10 reps - no hold - Heel Raises with Counter Support  - 1 x daily - 7 x weekly - 1-3 sets - 10 reps - no hold  ASSESSMENT:  CLINICAL IMPRESSION: Pt is doing much better today overall. No longer using her AD and reporting that the knee hasn't really hurt her lately.  Her daughter is present to make sure she is doing her HEP correctly.  Berg balance score was excellent at 55/56 but she did have some increased difficulty with an unstable surface.   Updated HEP.  Pt's family would like her to continue PT for strengthening now.   OBJECTIVE IMPAIRMENTS: decreased activity tolerance, decreased mobility, difficulty walking, and pain.   ACTIVITY LIMITATIONS: carrying, lifting, bending, sitting, standing, squatting, and stairs  PARTICIPATION LIMITATIONS: meal prep, cleaning, laundry, and community activity  PERSONAL FACTORS: Age, Fitness, Past/current experiences, Time since onset of injury/illness/exacerbation, and Transportation are also affecting patient's functional outcome.   REHAB POTENTIAL: Good  CLINICAL DECISION MAKING: Stable/uncomplicated  EVALUATION COMPLEXITY: Low   GOALS: Goals reviewed with patient? Yes  SHORT TERM GOALS = LTGs Target date: 07/17/24 Pt will decrease bil knee pain to 4/10 at the worst Baseline: 8/10 Goal status: MET  2.  Pt will be ind with exercises to continue in her pool and at home to continue with mobility  Baseline:  Goal status: INITIAL  3.  Pt will perform BERG balance scale with goal as needed  Baseline:  Goal status: MET - no goal needed   4.  Pt will be able to walk around her home without AD to return to PLOF   Baseline:  Goal status: MET     PLAN:  PT FREQUENCY: 2x/week  PT DURATION: 6 weeks  PLANNED INTERVENTIONS: 97110-Therapeutic exercises, 97530- Therapeutic activity, [id number]- Neuromuscular re-education, 97535- Self Care, [id number]- Manual therapy, [phone]- Gait training, [phone]- Aquatic Therapy, and Taping  PLAN FOR NEXT SESSION: general knee strength   Kristeen Sar, PT 06/20/24 11:51 AM

## 2024-06-25 ENCOUNTER — Ambulatory Visit: Payer: Self-pay

## 2024-06-25 NOTE — Telephone Encounter (Signed)
 FYI Only or Action Required?: FYI only for provider.  Patient was last seen in primary care on 05/25/2024 by Merna Huxley, NP.  Called Nurse Triage reporting Blood Sugar Problem. - thirst  Symptoms began recently.  Interventions attempted: Nothing.  Symptoms are: stable.  Triage Disposition: Home Care - pt is very thirsty and has elevated fasting blood sugars.  Patient/caregiver understands and will follow disposition?: Yes               Reason for Disposition  Blood glucose 70-240 mg/dL (3.9 -86.6 mmol/L)  Answer Assessment - Initial Assessment Questions 1. BLOOD GLUCOSE: What is your blood glucose level?      Unsure currently 2. ONSET: When did you check the blood glucose?     Every few days 3. USUAL RANGE: What is your glucose level usually? (e.g., usual fasting morning value, usual evening value)     unsure 4. KETONES: Do you check for ketones (urine or blood test strips)? If Yes, ask: What does the test show now?      no 5. TYPE 1 or 2:  Do you know what type of diabetes you have?  (e.g., Type 1, Type 2, Gestational; doesn't know)      none 6. INSULIN : Do you take insulin ? What type of insulin (s) do you use? What is the mode of delivery? (syringe, pen; injection or pump)?      no 7. DIABETES PILLS: Do you take any pills for your diabetes? If Yes, ask: Have you missed taking any pills recently?     no 8. OTHER SYMPTOMS: Do you have any symptoms? (e.g., fever, frequent urination, difficulty breathing, dizziness, weakness, vomiting)     Thirsty  Protocols used: Diabetes - High Blood Sugar-A-AH

## 2024-06-25 NOTE — Progress Notes (Signed)
 Surgical Instructions   Your procedure is scheduled on Friday, August 15th, 2025. Report to [facility] Main Entrance A at 11:00 A.M., then check in with the Admitting office. Any questions or running late day of surgery: call [phone]  Questions prior to your surgery date: call [phone], Monday-Friday, 8am-4pm. If you experience any cold or flu symptoms such as cough, fever, chills, shortness of breath, etc. between now and your scheduled surgery, please notify us  at the above number.     Remember:  Do not eat or drink after midnight the night before your surgery     Take these medicines the morning of surgery with A SIP OF WATER: Duloxetine  (Cymbalta ) Esomeprazole  (Nexium ) Levothyroxine  (Synthroid )   May take these medicines IF NEEDED: Chlorpromazine (Thorazine)    One week prior to surgery, STOP taking any Aspirin (unless otherwise instructed by your surgeon) Aleve, Naproxen, Ibuprofen, Motrin, Advil, Goody's, BC's, all herbal medications, fish oil, and non-prescription vitamins.     HOW TO MANAGE YOUR DIABETES BEFORE AND AFTER SURGERY  Why is it important to control my blood sugar before and after surgery? Improving blood sugar levels before and after surgery helps healing and can limit problems. A way of improving blood sugar control is eating a healthy diet by:  Eating less sugar and carbohydrates  Increasing activity/exercise  Talking with your doctor about reaching your blood sugar goals High blood sugars (greater than 180 mg/dL) can raise your risk of infections and slow your recovery, so you will need to focus on controlling your diabetes during the weeks before surgery. Make sure that the doctor who takes care of your diabetes knows about your planned surgery including the date and location.  How do I manage my blood sugar before surgery? Check your blood sugar at least 4 times a day, starting 2 days before surgery, to make sure that the level is not  too high or low.  Check your blood sugar the morning of your surgery when you wake up and every 2 hours until you get to the Short Stay unit.  If your blood sugar is less than 70 mg/dL, you will need to treat for low blood sugar: Do not take insulin . Treat a low blood sugar (less than 70 mg/dL) with  cup of clear juice (cranberry or apple), 4 glucose tablets, OR glucose gel. Recheck blood sugar in 15 minutes after treatment (to make sure it is greater than 70 mg/dL). If your blood sugar is not greater than 70 mg/dL on recheck, call [phone] for further instructions. Report your blood sugar to the short stay nurse when you get to Short Stay.  If you are admitted to the hospital after surgery: Your blood sugar will be checked by the staff and you will probably be given insulin  after surgery (instead of oral diabetes medicines) to make sure you have good blood sugar levels. The goal for blood sugar control after surgery is 80-180 mg/dL.                      Do NOT Smoke (Tobacco/Vaping) for 24 hours prior to your procedure.  If you use a CPAP at night, you may bring your mask/headgear for your overnight stay.   You will be asked to remove any contacts, glasses, piercing's, hearing aid's, dentures/partials prior to surgery. Please bring cases for these items if needed.    Patients discharged the day of surgery will not be allowed to drive home, and someone needs to stay  with them for 24 hours.  SURGICAL WAITING ROOM VISITATION Patients may have no more than 2 support people in the waiting area - these visitors may rotate.   Pre-op nurse will coordinate an appropriate time for 1 ADULT support person, who may not rotate, to accompany patient in pre-op.  Children under the age of 37 must have an adult with them who is not the patient and must remain in the main waiting area with an adult.  If the patient needs to stay at the hospital during part of their recovery, the visitor guidelines  for inpatient rooms apply.  Please refer to the [facility] website for the visitor guidelines for any additional information.   If you received a COVID test during your pre-op visit  it is requested that you wear a mask when out in public, stay away from anyone that may not be feeling well and notify your surgeon if you develop symptoms. If you have been in contact with anyone that has tested positive in the last 10 days please notify you surgeon.      Pre-operative CHG Bathing Instructions   You can play a key role in reducing the risk of infection after surgery. Your skin needs to be as free of germs as possible. You can reduce the number of germs on your skin by washing with CHG (chlorhexidine  gluconate) soap before surgery. CHG is an antiseptic soap that kills germs and continues to kill germs even after washing.   DO NOT use if you have an allergy to chlorhexidine /CHG or antibacterial soaps. If your skin becomes reddened or irritated, stop using the CHG and notify one of our RNs at [phone].              TAKE A SHOWER THE NIGHT BEFORE SURGERY AND THE DAY OF SURGERY    Please keep in mind the following:  DO NOT shave, including legs and underarms, 48 hours prior to surgery.   You may shave your face before/day of surgery.  Place clean sheets on your bed the night before surgery Use a clean washcloth (not used since being washed) for each shower. DO NOT sleep with pet's night before surgery.  CHG Shower Instructions:  Wash your face and private area with normal soap. If you choose to wash your hair, wash first with your normal shampoo.  After you use shampoo/soap, rinse your hair and body thoroughly to remove shampoo/soap residue.  Turn the water OFF and apply half the bottle of CHG soap to a CLEAN washcloth.  Apply CHG soap ONLY FROM YOUR NECK DOWN TO YOUR TOES (washing for 3-5 minutes)  DO NOT use CHG soap on face, private areas, open wounds, or sores.  Pay special attention  to the area where your surgery is being performed.  If you are having back surgery, having someone wash your back for you may be helpful. Wait 2 minutes after CHG soap is applied, then you may rinse off the CHG soap.  Pat dry with a clean towel  Put on clean pajamas    Additional instructions for the day of surgery: DO NOT APPLY any lotions, deodorants, cologne, or perfumes.   Do not wear jewelry or makeup Do not wear nail polish, gel polish, artificial nails, or any other type of covering on natural nails (fingers and toes) Do not bring valuables to the hospital. [facility] is not responsible for valuables/personal belongings. Put on clean/comfortable clothes.  Please brush your teeth.  Ask your nurse  before applying any prescription medications to the skin.

## 2024-06-26 ENCOUNTER — Other Ambulatory Visit (HOSPITAL_COMMUNITY)

## 2024-06-26 ENCOUNTER — Other Ambulatory Visit: Payer: Self-pay

## 2024-06-26 ENCOUNTER — Encounter (HOSPITAL_COMMUNITY): Payer: Self-pay

## 2024-06-26 ENCOUNTER — Encounter (HOSPITAL_COMMUNITY)
Admission: RE | Admit: 2024-06-26 | Discharge: 2024-06-26 | Disposition: A | Discharge status: Home / Self Care | Source: Ambulatory Visit | Attending: Otolaryngology | Admitting: Otolaryngology

## 2024-06-26 VITALS — BP 111/48 | HR 83 | Temp 98.6°F | Resp 18 | Ht 59.0 in | Wt 185.0 lb

## 2024-06-26 DIAGNOSIS — Z01818 Encounter for other preprocedural examination: Secondary | ICD-10-CM | POA: Insufficient documentation

## 2024-06-26 DIAGNOSIS — E119 Type 2 diabetes mellitus without complications: Secondary | ICD-10-CM | POA: Insufficient documentation

## 2024-06-26 DIAGNOSIS — K760 Fatty (change of) liver, not elsewhere classified: Secondary | ICD-10-CM | POA: Insufficient documentation

## 2024-06-26 DIAGNOSIS — Z01810 Encounter for preprocedural cardiovascular examination: Secondary | ICD-10-CM | POA: Diagnosis present

## 2024-06-26 DIAGNOSIS — Z01812 Encounter for preprocedural laboratory examination: Secondary | ICD-10-CM | POA: Diagnosis present

## 2024-06-26 LAB — COMPREHENSIVE METABOLIC PANEL WITH GFR
ALT: 35 U/L (ref 0–44)
AST: 24 U/L (ref 15–41)
Albumin: 3.7 g/dL (ref 3.5–5.0)
Alkaline Phosphatase: 93 U/L (ref 38–126)
Anion gap: 10 (ref 5–15)
BUN: 16 mg/dL (ref 8–23)
CO2: 24 mmol/L (ref 22–32)
Calcium: 9.8 mg/dL (ref 8.9–10.3)
Chloride: 103 mmol/L (ref 98–111)
Creatinine, Ser: 0.79 mg/dL (ref 0.44–1.00)
GFR, Estimated: >60 mL/min (ref >60)
Glucose, Bld: 142 mg/dL — ABNORMAL HIGH (ref 70–99)
Potassium: 3.9 mmol/L (ref 3.5–5.1)
Sodium: 137 mmol/L (ref 135–145)
Total Bilirubin: 0.5 mg/dL (ref 0.0–1.2)
Total Protein: 6.9 g/dL (ref 6.5–8.1)

## 2024-06-26 LAB — CBC
HCT: 48.6 % — ABNORMAL HIGH (ref 36.0–46.0)
Hemoglobin: 16 g/dL — ABNORMAL HIGH (ref 12.0–15.0)
MCH: 29.7 pg (ref 26.0–34.0)
MCHC: 32.9 g/dL (ref 30.0–36.0)
MCV: 90.3 fL (ref 80.0–100.0)
Platelets: 267 K/uL (ref 150–400)
RBC: 5.38 MIL/uL — ABNORMAL HIGH (ref 3.87–5.11)
RDW: 13.4 % (ref 11.5–15.5)
WBC: 8 K/uL (ref 4.0–10.5)
nRBC: 0 % (ref 0.0–0.2)

## 2024-06-26 LAB — GLUCOSE, CAPILLARY: Glucose-Capillary: 157 mg/dL — ABNORMAL HIGH (ref 70–99)

## 2024-06-26 NOTE — Progress Notes (Signed)
 PCP - Darleene Shape, NP Cardiologist - denies  PPM/ICD - denies   Chest x-ray - 03/18/22 EKG - 06/26/24 Stress Test - denies ECHO - denies Cardiac Cath - denies  Sleep Study - OSA+ CPAP - denies  DM- pt does not check CBG at home but has had elevated A1C  Last dose of GLP1 agonist-  n/a   ASA/Blood Thinner Instructions: n/a   ERAS Protcol - no, NPO   COVID TEST- n/a   Anesthesia review: no  Patient denies shortness of breath, fever, cough and chest pain at PAT appointment   All instructions explained to the patient, with a verbal understanding of the material. Patient agrees to go over the instructions while at home for a better understanding.  The opportunity to ask questions was provided.

## 2024-06-27 ENCOUNTER — Ambulatory Visit: Admitting: Physical Therapy

## 2024-06-27 ENCOUNTER — Ambulatory Visit (INDEPENDENT_AMBULATORY_CARE_PROVIDER_SITE_OTHER): Admitting: Adult Health

## 2024-06-27 ENCOUNTER — Encounter: Payer: Self-pay | Admitting: Adult Health

## 2024-06-27 VITALS — BP 120/82 | HR 90 | Temp 98.2°F | Ht 59.0 in | Wt 183.0 lb

## 2024-06-27 DIAGNOSIS — E119 Type 2 diabetes mellitus without complications: Secondary | ICD-10-CM

## 2024-06-27 DIAGNOSIS — Z7984 Long term (current) use of oral hypoglycemic drugs: Secondary | ICD-10-CM | POA: Diagnosis not present

## 2024-06-27 LAB — HEMOGLOBIN A1C
Hgb A1c MFr Bld: 6.7 % — ABNORMAL HIGH (ref 4.8–5.6)
Mean Plasma Glucose: 146 mg/dL

## 2024-06-27 MED ORDER — BLOOD GLUCOSE MONITORING SUPPL DEVI: 1.0000 | Freq: Three times a day (TID) | 0 refills | Status: DC

## 2024-06-27 MED ORDER — LANCETS MISC. MISC: 1.0000 | Freq: Three times a day (TID) | 3 refills | Status: AC

## 2024-06-27 MED ORDER — METFORMIN HCL ER 500 MG PO TB24: 500.0000 mg | ORAL_TABLET | Freq: Every day | ORAL | 0 refills | Status: DC

## 2024-06-27 MED ORDER — BLOOD GLUCOSE TEST VI STRP: 1.0000 | ORAL_STRIP | Freq: Three times a day (TID) | 0 refills | Status: AC

## 2024-06-27 NOTE — Progress Notes (Signed)
 Subjective:    [name], female    DOB: 31-Jul-1941, 83 y.o.   MRN: [id number]  HPI  83 year old female who  has a past medical history of Adenomatous colon polyp, Anxiety, Arthritis, Breast cancer (HCC) (08/08/2013), Cataract, Chronic insomnia, Cluster headaches, Depression, Diverticulosis, Fatty liver (2011), Fibromyalgia, GERD (gastroesophageal reflux disease), H/O hiatal hernia, History of transfusion (08/30/2013), radiation therapy (10/29/13- 12/14/13), Hypothyroidism, Internal hemorrhoids, Irritable bowel syndrome, Kidney stone, Lymphedema, Macular degeneration, MDD (major depressive disorder), Osteopenia, Osteopenia, Other abnormal glucose, Other and unspecified hyperlipidemia, Pain in joint, shoulder region, Pneumonia (8032,7990), Sleep apnea, and Stress incontinence, female.  She presents to the office today for concern of elevated glucose levels. She reports that she has been checking her blood sugars every few days with readings above 150.   She did have her A1c checked yesterday during preop testing which showed an A1c of 6.7   Lab Results  Component Value Date   HGBA1C 6.7 (H) 06/26/2024   HGBA1C 6.3 (A) 06/10/2023   HGBA1C 6.2 (H) 03/03/2023     Review of Systems See HPI   Past Medical History:  Diagnosis Date   Adenomatous colon polyp    Anxiety    PHOBIAS   Arthritis    Breast cancer (HCC) 08/08/2013   right LOQ   Cataract    Chronic insomnia    Cluster headaches    history of migraines / NONE FOR SEVERAL YRS   Depression    Diverticulosis    Fatty liver 2011   Fibromyalgia    GERD (gastroesophageal reflux disease)    H/O hiatal hernia    History of transfusion 08/30/2013   Hx of radiation therapy 10/29/13- 12/14/13   right chest wall 5040 cGy 28 sessions, right supraclavicular/axillary region 5040 cGy 28 sessions, right chest wall boost 1000 cGy 5 sessions   Hypothyroidism    Internal hemorrhoids    Irritable bowel syndrome    Kidney  stone    Lymphedema    RT ARM - WEARS SLEEVE   Macular degeneration    hole/right eye   MDD (major depressive disorder)    Osteopenia    Osteopenia    Other abnormal glucose    Other and unspecified hyperlipidemia    Pain in joint, shoulder region    Pneumonia 8032,7990   Sleep apnea    does not use CPAP   Stress incontinence, female     Social History   Socioeconomic History   Marital status: Married    Spouse name: Ubaldo   Number of children: 3   Years of education: Not on file   Highest education level: Some college, no degree  Occupational History   Occupation: retired Catering manager  Tobacco Use   Smoking status: Former    Current packs/day: 0.00    Average packs/day: 0.1 packs/day for 2.0 years (0.2 ttl pk-yrs)    Types: Cigarettes    Start date: 11/16/1959    Quit date: 11/15/1960    Years since quitting: 63.6    Passive exposure: Past (over 58 years ago)   Smokeless tobacco: Never  Vaping Use   Vaping status: Never Used  Substance and Sexual Activity   Alcohol use: Not Currently   Drug use: No   Sexual activity: Yes    Comment: menarche age 51, fist live birth 54, P 3, hysterectomy age 53, no HRT, BCP 2 yrs  Other Topics Concern   Not on file  Social History Narrative   Occupation:  Retired Catering manager    Married with 3 grown children      Never Smoked     Alcohol use-no        Social Drivers of Corporate investment banker Strain: Low Risk  (06/23/2024)   Overall Financial Resource Strain (CARDIA)    Difficulty of Paying Living Expenses: Not hard at all  Food Insecurity: No Food Insecurity (06/23/2024)   Hunger Vital Sign    Worried About Running Out of Food in the Last Year: Never true    Ran Out of Food in the Last Year: Never true  Transportation Needs: No Transportation Needs (06/23/2024)   PRAPARE - Administrator, Civil Service (Medical): No    Lack of Transportation (Non-Medical): No  Physical Activity: Inactive (06/23/2024)   Exercise Vital  Sign    Days of Exercise per Week: 0 days    Minutes of Exercise per Session: Not on file  Stress: Stress Concern Present (06/23/2024)   [employer] of Occupational Health - Occupational Stress Questionnaire    Feeling of Stress: To some extent  Social Connections: Socially Integrated (06/23/2024)   Social Connection and Isolation Panel    Frequency of Communication with Friends and Family: More than three times a week    Frequency of Social Gatherings with Friends and Family: More than three times a week    Attends Religious Services: 1 to 4 times per year    Active Member of Clubs or Organizations: Yes    Attends Banker Meetings: 1 to 4 times per year    Marital Status: Married  Catering manager Violence: Not At Risk (02/09/2023)   Humiliation, Afraid, Rape, and Kick questionnaire    Fear of Current or Ex-Partner: No    Emotionally Abused: No    Physically Abused: No    Sexually Abused: No    Past Surgical History:  Procedure Laterality Date   ABDOMINAL HYSTERECTOMY     APPENDECTOMY     BILATERAL TOTAL MASTECTOMY WITH AXILLARY LYMPH NODE DISSECTION  08/30/2013   Dr ARON   BREAST CYST ASPIRATION     9 cysts   CATARACT EXTRACTION, BILATERAL  2005/2007   CHOLECYSTECTOMY     COLONOSCOPY     COLONOSCOPY WITH PROPOFOL  N/A 11/10/2023   Procedure: COLONOSCOPY WITH PROPOFOL ;  Surgeon: Wilhelmenia Aloha Raddle., MD;  Location: [facility];  Service: Gastroenterology;  Laterality: N/A;   CYSTOCELE REPAIR     DRUG INDUCED ENDOSCOPY N/A 05/30/2024   Procedure: DRUG INDUCED SLEEP ENDOSCOPY;  Surgeon: Okey Burns, MD;  Location: [facility];  Service: ENT;  Laterality: N/A;   ENDOSCOPIC MUCOSAL RESECTION N/A 11/10/2023   Procedure: ENDOSCOPIC MUCOSAL RESECTION;  Surgeon: Wilhelmenia Aloha Raddle., MD;  Location: [facility];  Service: Gastroenterology;  Laterality: N/A;   EVACUATION BREAST HEMATOMA Left 08/31/2013   Procedure: EVACUATION HEMATOMA BREAST;   Surgeon: Jina ARON, MD;  Location: [facility];  Service: General;  Laterality: Left;   EYE SURGERY     to repair macular hole   FOOT ARTHROPLASTY     lt    GANGLION CYST EXCISION     rt foot   HEMORRHOID SURGERY     03/1993   HEMOSTASIS CLIP PLACEMENT  11/10/2023   Procedure: HEMOSTASIS CLIP PLACEMENT;  Surgeon: Wilhelmenia Aloha Raddle., MD;  Location: [facility];  Service: Gastroenterology;;   JOINT REPLACEMENT  03/15/11   left knee replacement   KNEE ARTHROSCOPY     /partial knee 2016/left knee 2012  MASS EXCISION  11/04/2011   Procedure: EXCISION MASS;  Surgeon: Lamar LULLA Leonor Mickey., MD;  Location: [facility];  Service: Orthopedics;  Laterality: Right;  excisional biopsy right ulna mass   MASTECTOMY W/ SENTINEL NODE BIOPSY Right 08/30/2013   Procedure: RIGHT  AXILLARY SENTINEL LYMPH NODE BIOPSY; Right Axillary Node Disection;  Surgeon: Jina Nephew, MD;  Location: [facility];  Service: General;  Laterality: Right;  Right side nuc med 7:00    PARTIAL KNEE ARTHROPLASTY Right 11/03/2015   Procedure: RIGHT KNEE MEDIAL UNICOMPARTMENTAL ARTHROPLASTY ;  Surgeon: Dempsey Moan, MD;  Location: [facility];  Service: Orthopedics;  Laterality: Right;   POLYPECTOMY  11/10/2023   Procedure: POLYPECTOMY;  Surgeon: Mansouraty, Aloha Mickey., MD;  Location: [facility];  Service: Gastroenterology;;   RECTOCELE REPAIR     SIMPLE MASTECTOMY WITH AXILLARY SENTINEL NODE BIOPSY Left 08/30/2013   Procedure: Bilateral Breast Mastectomy ;  Surgeon: Jina Nephew, MD;  Location: [facility];  Service: General;  Laterality: Left;   skin tags removed     breast, panty line, neckline   SUBMUCOSAL LIFTING INJECTION  11/10/2023   Procedure: SUBMUCOSAL LIFTING INJECTION;  Surgeon: Wilhelmenia Aloha Mickey., MD;  Location: [facility];  Service: Gastroenterology;;   TOE SURGERY     preventative crossover toe surg/right foot   TOE SURGERY  2009   left foot/screw  in 2nd toe   TONSILLECTOMY     UPPER GASTROINTESTINAL  ENDOSCOPY      Family History  Problem Relation Age of Onset   Stroke Mother        died age 8   Diabetes Mother    Breast cancer Mother 81   Breast cancer Sister 80   Brain cancer Maternal Uncle 8   Breast cancer Paternal Aunt 30   Breast cancer Paternal Aunt        dx in her 71s   Cancer Maternal Grandmother        intra-abdominal cancer   Diabetes Maternal Grandfather    Breast cancer Paternal Grandmother 109   Brain cancer Cousin 60       maternal cousin   Brain cancer Cousin 20       paternal cousin   Colon cancer Neg Hx    Colon polyps Neg Hx    Crohn's disease Neg Hx    Esophageal cancer Neg Hx    Rectal cancer Neg Hx    Stomach cancer Neg Hx    Ulcerative colitis Neg Hx    Inflammatory bowel disease Neg Hx    Liver disease Neg Hx    Pancreatic cancer Neg Hx     No Known Allergies  Current Outpatient Medications on File Prior to Visit  Medication Sig Dispense Refill   acidophilus (RISAQUAD) CAPS capsule Take 1 capsule by mouth daily.     chlorproMAZINE (THORAZINE) 25 MG tablet Take 12.5-25 mg by mouth 3 (three) times daily as needed (headache).     Cholecalciferol (VITAMIN D ) 50 MCG (2000 UT) tablet Take 2,000 Units by mouth daily.     DULoxetine  (CYMBALTA ) 30 MG capsule Take 1 capsule (30 mg total) by mouth every evening. 90 capsule 0   DULoxetine  (CYMBALTA ) 60 MG capsule Take 60 mg by mouth daily.     esomeprazole  (NEXIUM ) 40 MG capsule TAKE 1 CAPSULE BY MOUTH DAILY AT NOON (Patient taking differently: Take 40 mg by mouth in the morning.) 365 capsule 0   famotidine  (PEPCID ) 40 MG tablet TAKE 1 TABLET BY MOUTH AT BEDTIME  90 tablet 1   levothyroxine  (SYNTHROID ) 100 MCG tablet TAKE 1 TABLET BY MOUTH DAILY 365 tablet 0   Multiple Vitamins-Minerals (PRESERVISION AREDS 2 PO) Take 1 capsule by mouth in the morning and at bedtime.     omega-3 acid ethyl esters (LOVAZA ) 1 g capsule TAKE 1 CAPSULE BY MOUTH TWICE A DAY 180 capsule 3   zonisamide (ZONEGRAN) 50 MG capsule  Take 150 mg by mouth daily with supper.     No current facility-administered medications on file prior to visit.    BP 120/82   Pulse 90   Temp 98.2 F (36.8 C) (Oral)   Ht 4' 11 (1.499 m)   Wt 183 lb (83 kg)   SpO2 95%   BMI 36.96 kg/m       Objective:   Physical Exam Vitals and nursing note reviewed.  Constitutional:      Appearance: Normal appearance.  Cardiovascular:     Rate and Rhythm: Normal rate and regular rhythm.     Pulses: Normal pulses.     Heart sounds: Normal heart sounds.  Pulmonary:     Effort: Pulmonary effort is normal.     Breath sounds: Normal breath sounds.  Musculoskeletal:        General: Normal range of motion.  Skin:    General: Skin is warm and dry.  Neurological:     General: No focal deficit present.     Mental Status: She is alert and oriented to person, place, and time.  Psychiatric:        Mood and Affect: Mood normal.        Behavior: Behavior normal.        Thought Content: Thought content normal.        Judgment: Judgment normal.       Assessment & Plan:  1. Diabetes mellitus treated with oral medication (HCC) (Primary) - Will start her on Metformin  500 mg daily. She was advised not to take this medication until after her surgery to have Inspire placed.  - Follow up in 3 months  - metFORMIN  (GLUCOPHAGE -XR) 500 MG 24 hr tablet; Take 1 tablet (500 mg total) by mouth daily with breakfast.  Dispense: 90 tablet; Refill: 0 - Blood Glucose Monitoring Suppl DEVI; 1 each by Does not apply route in the morning, at noon, and at bedtime. May substitute to any manufacturer covered by patient's insurance.  Dispense: 1 each; Refill: 0 - Glucose Blood (BLOOD GLUCOSE TEST STRIPS) STRP; 1 each by In Vitro route in the morning, at noon, and at bedtime. May substitute to any manufacturer covered by patient's insurance.  Dispense: 300 each; Refill: 0 - Lancets Misc. MISC; 1 each by Does not apply route in the morning, at noon, and at bedtime. May  substitute to any manufacturer covered by patient's insurance.  Dispense: 300 each; Refill: 3   Trace Wirick, NP

## 2024-06-28 ENCOUNTER — Ambulatory Visit: Admitting: Physical Therapy

## 2024-06-29 ENCOUNTER — Ambulatory Visit (HOSPITAL_COMMUNITY)

## 2024-06-29 ENCOUNTER — Ambulatory Visit (HOSPITAL_COMMUNITY)
Admission: RE | Admit: 2024-06-29 | Discharge: 2024-06-29 | Disposition: A | Discharge status: Home / Self Care | Attending: Otolaryngology | Admitting: Otolaryngology

## 2024-06-29 ENCOUNTER — Encounter (HOSPITAL_COMMUNITY): Admission: RE | Payer: Self-pay | Source: Home / Self Care

## 2024-06-29 ENCOUNTER — Other Ambulatory Visit: Payer: Self-pay

## 2024-06-29 ENCOUNTER — Encounter (HOSPITAL_COMMUNITY): Payer: Self-pay

## 2024-06-29 ENCOUNTER — Ambulatory Visit (HOSPITAL_COMMUNITY): Admission: RE | Admit: 2024-06-29 | Source: Home / Self Care

## 2024-06-29 ENCOUNTER — Inpatient Hospital Stay (HOSPITAL_COMMUNITY): Admitting: Anesthesiology

## 2024-06-29 ENCOUNTER — Encounter (HOSPITAL_COMMUNITY)
Admission: RE | Disposition: A | Discharge status: Home / Self Care | Payer: Self-pay | Source: Home / Self Care | Attending: Otolaryngology

## 2024-06-29 DIAGNOSIS — K449 Diaphragmatic hernia without obstruction or gangrene: Secondary | ICD-10-CM | POA: Insufficient documentation

## 2024-06-29 DIAGNOSIS — Z7984 Long term (current) use of oral hypoglycemic drugs: Secondary | ICD-10-CM | POA: Diagnosis not present

## 2024-06-29 DIAGNOSIS — Z79899 Other long term (current) drug therapy: Secondary | ICD-10-CM | POA: Insufficient documentation

## 2024-06-29 DIAGNOSIS — Z6836 Body mass index (BMI) 36.0-36.9, adult: Secondary | ICD-10-CM | POA: Diagnosis not present

## 2024-06-29 DIAGNOSIS — E039 Hypothyroidism, unspecified: Secondary | ICD-10-CM | POA: Diagnosis not present

## 2024-06-29 DIAGNOSIS — G4733 Obstructive sleep apnea (adult) (pediatric): Secondary | ICD-10-CM

## 2024-06-29 DIAGNOSIS — F419 Anxiety disorder, unspecified: Secondary | ICD-10-CM | POA: Insufficient documentation

## 2024-06-29 DIAGNOSIS — F32A Depression, unspecified: Secondary | ICD-10-CM | POA: Insufficient documentation

## 2024-06-29 DIAGNOSIS — Z91198 Patient's noncompliance with other medical treatment and regimen for other reason: Secondary | ICD-10-CM | POA: Diagnosis not present

## 2024-06-29 DIAGNOSIS — R519 Headache, unspecified: Secondary | ICD-10-CM | POA: Diagnosis not present

## 2024-06-29 DIAGNOSIS — E119 Type 2 diabetes mellitus without complications: Secondary | ICD-10-CM | POA: Diagnosis not present

## 2024-06-29 DIAGNOSIS — K219 Gastro-esophageal reflux disease without esophagitis: Secondary | ICD-10-CM | POA: Insufficient documentation

## 2024-06-29 DIAGNOSIS — E66812 Obesity, class 2: Secondary | ICD-10-CM | POA: Insufficient documentation

## 2024-06-29 DIAGNOSIS — Z87891 Personal history of nicotine dependence: Secondary | ICD-10-CM | POA: Insufficient documentation

## 2024-06-29 DIAGNOSIS — J45909 Unspecified asthma, uncomplicated: Secondary | ICD-10-CM | POA: Diagnosis not present

## 2024-06-29 DIAGNOSIS — M797 Fibromyalgia: Secondary | ICD-10-CM | POA: Diagnosis not present

## 2024-06-29 HISTORY — DX: Type 2 diabetes mellitus without complications: E11.9

## 2024-06-29 HISTORY — PX: IMPLANTATION OF HYPOGLOSSAL NERVE STIMULATOR: SHX6827

## 2024-06-29 LAB — GLUCOSE, CAPILLARY
Glucose-Capillary: 127 mg/dL — ABNORMAL HIGH (ref 70–99)
Glucose-Capillary: 147 mg/dL — ABNORMAL HIGH (ref 70–99)

## 2024-06-29 SURGERY — INSERTION, HYPOGLOSSAL NERVE STIMULATOR
Anesthesia: General

## 2024-06-29 SURGERY — INSERTION, HYPOGLOSSAL NERVE STIMULATOR
Anesthesia: General | Site: Chest

## 2024-06-29 MED ORDER — ACETAMINOPHEN 500 MG PO TABS: 500.0000 mg | ORAL_TABLET | Freq: Four times a day (QID) | ORAL | 0 refills | Status: DC

## 2024-06-29 MED ORDER — HYDROMORPHONE HCL 1 MG/ML IJ SOLN: 0.2500 mg | INTRAMUSCULAR | Status: DC | PRN

## 2024-06-29 MED ORDER — AMOXICILLIN-POT CLAVULANATE 875-125 MG PO TABS: 1.0000 | ORAL_TABLET | Freq: Two times a day (BID) | ORAL | 0 refills | Status: DC

## 2024-06-29 MED ORDER — FENTANYL CITRATE (PF) 250 MCG/5ML IJ SOLN
INTRAMUSCULAR | Status: AC
  Filled 2024-06-29: qty 5

## 2024-06-29 MED ORDER — LACTATED RINGERS IV SOLN: INTRAVENOUS | Status: DC

## 2024-06-29 MED ORDER — ROCURONIUM BROMIDE 10 MG/ML (PF) SYRINGE
PREFILLED_SYRINGE | INTRAVENOUS | Status: AC
  Filled 2024-06-29: qty 10

## 2024-06-29 MED ORDER — LIDOCAINE-EPINEPHRINE 1 %-1:100000 IJ SOLN
INTRAMUSCULAR | Status: DC | PRN
  Administered 2024-06-29: 7 mL

## 2024-06-29 MED ORDER — PHENYLEPHRINE 80 MCG/ML (10ML) SYRINGE FOR IV PUSH (FOR BLOOD PRESSURE SUPPORT)
PREFILLED_SYRINGE | INTRAVENOUS | Status: DC | PRN
  Administered 2024-06-29 (×2): 80 ug via INTRAVENOUS
  Administered 2024-06-29: 160 ug via INTRAVENOUS
  Administered 2024-06-29 (×3): 80 ug via INTRAVENOUS

## 2024-06-29 MED ORDER — ACETAMINOPHEN 10 MG/ML IV SOLN
INTRAVENOUS | Status: AC
Start: 2024-06-29 — End: 2024-06-29
  Filled 2024-06-29: qty 100

## 2024-06-29 MED ORDER — ONDANSETRON HCL 4 MG/2ML IJ SOLN
INTRAMUSCULAR | Status: DC | PRN
  Administered 2024-06-29: 4 mg via INTRAVENOUS

## 2024-06-29 MED ORDER — DEXAMETHASONE SODIUM PHOSPHATE 10 MG/ML IJ SOLN
INTRAMUSCULAR | Status: DC | PRN
  Administered 2024-06-29: 5 mg via INTRAVENOUS

## 2024-06-29 MED ORDER — CHLORHEXIDINE GLUCONATE 0.12 % MT SOLN
15.0000 mL | Freq: Once | OROMUCOSAL | Status: AC
  Administered 2024-06-29: 15 mL via OROMUCOSAL
  Filled 2024-06-29: qty 15

## 2024-06-29 MED ORDER — PROPOFOL 10 MG/ML IV BOLUS
INTRAVENOUS | Status: AC
  Filled 2024-06-29: qty 20

## 2024-06-29 MED ORDER — DEXAMETHASONE SODIUM PHOSPHATE 10 MG/ML IJ SOLN
INTRAMUSCULAR | Status: AC
  Filled 2024-06-29: qty 1

## 2024-06-29 MED ORDER — FENTANYL CITRATE (PF) 250 MCG/5ML IJ SOLN
INTRAMUSCULAR | Status: DC | PRN
  Administered 2024-06-29: 25 ug via INTRAVENOUS
  Administered 2024-06-29: 100 ug via INTRAVENOUS
  Administered 2024-06-29 (×3): 25 ug via INTRAVENOUS

## 2024-06-29 MED ORDER — ACETAMINOPHEN 10 MG/ML IV SOLN
1000.0000 mg | Freq: Once | INTRAVENOUS | Status: DC | PRN
  Administered 2024-06-29: 1000 mg via INTRAVENOUS

## 2024-06-29 MED ORDER — ORAL CARE MOUTH RINSE: 15.0000 mL | Freq: Once | OROMUCOSAL | Status: AC

## 2024-06-29 MED ORDER — ONDANSETRON HCL 4 MG/2ML IJ SOLN
INTRAMUSCULAR | Status: AC
  Filled 2024-06-29: qty 2

## 2024-06-29 MED ORDER — PROPOFOL 10 MG/ML IV BOLUS
INTRAVENOUS | Status: DC | PRN
  Administered 2024-06-29: 40 mg via INTRAVENOUS
  Administered 2024-06-29: 100 mg via INTRAVENOUS

## 2024-06-29 MED ORDER — PHENYLEPHRINE HCL-NACL 20-0.9 MG/250ML-% IV SOLN
INTRAVENOUS | Status: DC | PRN
  Administered 2024-06-29: 30 ug/min via INTRAVENOUS

## 2024-06-29 MED ORDER — EPHEDRINE SULFATE-NACL 50-0.9 MG/10ML-% IV SOSY
PREFILLED_SYRINGE | INTRAVENOUS | Status: DC | PRN
  Administered 2024-06-29 (×6): 5 mg via INTRAVENOUS

## 2024-06-29 MED ORDER — SODIUM CHLORIDE 0.9 % IV SOLN
3.0000 g | INTRAVENOUS | Status: AC
  Administered 2024-06-29: 3 g via INTRAVENOUS
  Filled 2024-06-29 (×3): qty 8

## 2024-06-29 MED ORDER — SUCCINYLCHOLINE CHLORIDE 200 MG/10ML IV SOSY
PREFILLED_SYRINGE | INTRAVENOUS | Status: DC | PRN
  Administered 2024-06-29: 120 mg via INTRAVENOUS

## 2024-06-29 MED ORDER — LIDOCAINE 2% (20 MG/ML) 5 ML SYRINGE
INTRAMUSCULAR | Status: AC
  Filled 2024-06-29: qty 5

## 2024-06-29 MED ORDER — LIDOCAINE 2% (20 MG/ML) 5 ML SYRINGE
INTRAMUSCULAR | Status: DC | PRN
  Administered 2024-06-29: 60 mg via INTRAVENOUS

## 2024-06-29 MED ORDER — OXYCODONE HCL 5 MG PO TABS: 5.0000 mg | ORAL_TABLET | Freq: Four times a day (QID) | ORAL | 0 refills | Status: DC | PRN

## 2024-06-29 MED ORDER — LIDOCAINE-EPINEPHRINE 1 %-1:100000 IJ SOLN
INTRAMUSCULAR | Status: AC
  Filled 2024-06-29: qty 1

## 2024-06-29 MED ORDER — DEXMEDETOMIDINE HCL IN NACL 80 MCG/20ML IV SOLN
INTRAVENOUS | Status: DC | PRN
  Administered 2024-06-29: 20 ug via INTRAVENOUS

## 2024-06-29 SURGICAL SUPPLY — 60 items
BAG COUNTER SPONGE SURGICOUNT (BAG) ×1 IMPLANT
BAG DECANTER FOR FLEXI CONT (MISCELLANEOUS) ×1 IMPLANT
BLADE CLIPPER SURG (BLADE) IMPLANT
BLADE SURG 15 STRL LF DISP TIS (BLADE) ×3 IMPLANT
CANISTER SUCTION 3000ML PPV (SUCTIONS) ×1 IMPLANT
CORD BIPOLAR FORCEPS 12FT (ELECTRODE) ×1 IMPLANT
COVER PROBE W GEL 5X96 (DRAPES) ×1 IMPLANT
COVER SURGICAL LIGHT HANDLE (MISCELLANEOUS) ×1 IMPLANT
DERMABOND ADVANCED .7 DNX12 (GAUZE/BANDAGES/DRESSINGS) ×2 IMPLANT
DRAPE C-ARM 35X43 STRL (DRAPES) ×1 IMPLANT
DRAPE INCISE IOBAN 66X45 STRL (DRAPES) ×1 IMPLANT
DRAPE MICROSCOPE LEICA 54X105 (DRAPES) ×1 IMPLANT
DRSG TEGADERM 4X4.75 (GAUZE/BANDAGES/DRESSINGS) ×3 IMPLANT
ELECT COATED BLADE 2.86 ST (ELECTRODE) ×1 IMPLANT
ELECTRODE 4 CHANNEL SET (MISCELLANEOUS) IMPLANT
ELECTRODE EMG 18 NIMS (NEUROSURGERY SUPPLIES) ×1 IMPLANT
ELECTRODE REM PT RTRN 9FT ADLT (ELECTROSURGICAL) ×1 IMPLANT
GAUZE 4X4 16PLY ~~LOC~~+RFID DBL (SPONGE) ×1 IMPLANT
GAUZE SPONGE 4X4 12PLY STRL (GAUZE/BANDAGES/DRESSINGS) ×1 IMPLANT
GENERATOR PULSE INSPIRE (Generator) ×1 IMPLANT
GENERATOR PULSE INSPIRE IV (Generator) ×1 IMPLANT
GLOVE BIO SURGEON STRL SZ 6 (GLOVE) ×1 IMPLANT
GLOVE BIO SURGEON STRL SZ7.5 (GLOVE) ×1 IMPLANT
GOWN STRL REUS W/ TWL LRG LVL3 (GOWN DISPOSABLE) ×3 IMPLANT
KIT BASIN OR (CUSTOM PROCEDURE TRAY) ×1 IMPLANT
KIT NEURO ACCESSORY W/WRENCH (MISCELLANEOUS) IMPLANT
KIT TURNOVER KIT B (KITS) ×1 IMPLANT
LEAD SENSING RESP INSPIRE (Lead) ×1 IMPLANT
LEAD SENSING RESP INSPIRE IV (Lead) ×1 IMPLANT
LEAD SLEEP STIM INSPIRE IV/V (Lead) ×1 IMPLANT
LEAD SLEEP STIMULATION INSPIRE (Lead) ×1 IMPLANT
LOOP VASCLR MAXI BLUE 18IN ST (MISCELLANEOUS) ×1 IMPLANT
LOOPS VASCLR MAXI BLUE 18IN ST (MISCELLANEOUS) ×1 IMPLANT
MARKER SKIN DUAL TIP RULER LAB (MISCELLANEOUS) ×2 IMPLANT
NDL HYPO 25GX1X1/2 BEV (NEEDLE) ×1 IMPLANT
NEEDLE HYPO 25GX1X1/2 BEV (NEEDLE) ×1 IMPLANT
NS IRRIG 1000ML POUR BTL (IV SOLUTION) ×1 IMPLANT
PAD ARMBOARD POSITIONER FOAM (MISCELLANEOUS) ×1 IMPLANT
PASSER CATH 38CM DISP (INSTRUMENTS) ×1 IMPLANT
PENCIL BUTTON HOLSTER BLD 10FT (ELECTRODE) ×1 IMPLANT
POSITIONER HEAD DONUT 9IN (MISCELLANEOUS) ×1 IMPLANT
PROBE NERVE STIMULATOR (NEUROSURGERY SUPPLIES) ×1 IMPLANT
REMOTE CONTROL SLEEP INSPIRE (MISCELLANEOUS) ×1 IMPLANT
RETRACTOR STAY HOOK 5MM (MISCELLANEOUS) ×1 IMPLANT
SET WALTER ACTIVATION W/DRAPE (SET/KITS/TRAYS/PACK) IMPLANT
SHEARS HARMONIC 9CM CVD (BLADE) ×1 IMPLANT
SPONGE INTESTINAL PEANUT (DISPOSABLE) ×3 IMPLANT
SUT MNCRL AB 4-0 PS2 18 (SUTURE) ×2 IMPLANT
SUT SILK 2 0 SH CR/8 (SUTURE) IMPLANT
SUT SILK 2 0 TIES 10X30 (SUTURE) IMPLANT
SUT SILK 3 0 TIES 10X30 (SUTURE) IMPLANT
SUT SILK 3 0SH CR/8 30 (SUTURE) IMPLANT
SUT VIC AB 3-0 SH 27X BRD (SUTURE) ×2 IMPLANT
SUTURE SILK 3-0 RB1 30XBRD (SUTURE) ×2 IMPLANT
SUTURE SILK PEM 2-0 1X30 RB-1 (SUTURE) ×2 IMPLANT
SYR 10ML LL (SYRINGE) ×1 IMPLANT
TAPE CLOTH SURG 4X10 WHT LF (GAUZE/BANDAGES/DRESSINGS) ×1 IMPLANT
TOWEL GREEN STERILE (TOWEL DISPOSABLE) ×1 IMPLANT
TRAY ENT MC OR (CUSTOM PROCEDURE TRAY) ×1 IMPLANT
VASCULAR TIE MINI RED 18IN STL (MISCELLANEOUS) ×1 IMPLANT

## 2024-06-29 NOTE — Transfer of Care (Signed)
 Immediate Anesthesia Transfer of Care Note  Patient: Jean Nash  Procedure(s) Performed: INSERTION, HYPOGLOSSAL NERVE STIMULATOR (Chest)  Patient Location: PACU  Anesthesia Type:General  Level of Consciousness: drowsy  Airway & Oxygen Therapy: Patient Spontanous Breathing and Patient connected to face mask oxygen  Post-op Assessment: Report given to RN and Post -op Vital signs reviewed and stable  Post vital signs: Reviewed and stable  Last Vitals:  Vitals Value Taken Time  BP 144/68 06/29/24 16:45  Temp    Pulse 70 06/29/24 16:48  Resp 13 06/29/24 16:48  SpO2 93 % 06/29/24 16:48  Vitals shown include unfiled device data.  Last Pain:  Vitals:   06/29/24 1123  TempSrc:   PainSc: 0-No pain         Complications: No notable events documented.

## 2024-06-29 NOTE — Discharge Instructions (Signed)
INSPIRE POST-OPERATIVE INSTRUCTIONS:   Please review post-operative and recovery instructions below that you will need to be aware of after Inspire Implant surgery.  Please restart all of your home medications if you take anything on a daily basis.  You can resume regular diet after this procedure.  You will be scheduled for an appointment with Dr. Irene Pap to review details about surgery and to discuss the next steps.   DIET: Resume normal diet HYGIENE: Please wait until 48 hours after surgery before getting incisions on neck, chest, and torso wet. In the first 48 hours after surgery, will likely need to take sponge baths. WOUND CARE: Please leave pressure dressing on for 48 hours after surgery. Gently place antibiotic ointment over incisions 2 times per day; use clean q-tip. May place a clean bandage over incisions as needed. After 48 hours, you may get incisions wet with warm soap and water, but do not soak the incisions.  Pat area dry gently.  Immediately place antibiotic ointment. Take oral antibiotics as prescribed If skin around incision starts to get red (> 1cm), swollen, and/or more painful, please call the office ACTIVITY: Try to avoid sleeping on the side of your surgery, to the extent possible.   You may walk for exercise starting the day after surgery. For 2 weeks: Do not pick up anything greater than 5 pounds with the hand/arm that's on the same side as the surgery.  After 2 weeks, you may increase weight to 10 pounds.   Consider performing neck rolls 10 clockwise and 10 counterclockwise 3x/day. For 4 weeks, no strenuous activity (running, jogging, lifting weights, gardening, sports) or until cleared by physician.   PAIN MEDICATIONS: You will be prescribed Oxycodone for pain.   If pain is not severe, consider taking Tylenol 650mg  every 6 hours Avoid aspirin for 7 days after surgery Avoid direct heat (such as heating pads) to incision sites.   May gently place ice over  surgery sites as needed.  Please place a thin clean towel over skin first and then place ice bag over towel.  Ice for 10 minutes at a time only.  POST-OPERATIVE CLINIC APPOINTMENTS: 1 week: suture removal and wound check in the office.  1 month: device activation and wound check in the office. 2.5 months: check in visit to assess usage. 3-4 months: titration sleep study based on usage of >4 hrs/night.  4 months: final wound check in the office.  Yearly: device check at office.  SCAR CARE: After incisions have healed, you will have a scar, which will continue to evolve over the course of 12 months.  Caring for your incision scars will help them to be as minimal as possible. If you are out in the sun with incision exposure, please remember to place sunscreen over the incision and surrounding skin.   You may use vitamin E or "Scar ointment/cream" to help soften scar.  Please wait one month after surgery before starting this.

## 2024-06-29 NOTE — Anesthesia Preprocedure Evaluation (Addendum)
 Anesthesia Evaluation  Patient identified by MRN, date of birth, ID band Patient awake    Reviewed: Allergy & Precautions, NPO status , Patient's Chart, lab work & pertinent test results  Airway Mallampati: II  TM Distance: >3 FB Neck ROM: Full    Dental no notable dental hx.    Pulmonary asthma , sleep apnea , former smoker   Pulmonary exam normal        Cardiovascular negative cardio ROS  Rhythm:Regular Rate:Normal     Neuro/Psych  Headaches  Anxiety Depression       GI/Hepatic Neg liver ROS, hiatal hernia,GERD  Medicated,,  Endo/Other  diabetes, Type 2, Oral Hypoglycemic AgentsHypothyroidism    Renal/GU   negative genitourinary   Musculoskeletal  (+) Arthritis ,  Fibromyalgia -  Abdominal Normal abdominal exam  (+)   Peds  Hematology negative hematology ROS (+)   Anesthesia Other Findings   Reproductive/Obstetrics                              Anesthesia Physical Anesthesia Plan  ASA: 3  Anesthesia Plan: General   Post-op Pain Management:    Induction: Intravenous  PONV Risk Score and Plan: 3 and Ondansetron , Dexamethasone  and Treatment may vary due to age or medical condition  Airway Management Planned: Mask and Oral ETT  Additional Equipment: None  Intra-op Plan:   Post-operative Plan:   Informed Consent: I have reviewed the patients History and Physical, chart, labs and discussed the procedure including the risks, benefits and alternatives for the proposed anesthesia with the patient or authorized representative who has indicated his/her understanding and acceptance.     Dental advisory given  Plan Discussed with: CRNA  Anesthesia Plan Comments:         Anesthesia Quick Evaluation

## 2024-06-29 NOTE — Op Note (Addendum)
 Operative Report                                                            SURGEON: Elena Larry, MD  ASSISTANT: Reyes Cohen, PA-C  PREOPERATIVE DIAGNOSIS: Obstructive sleep apnea. BMI of 36  POSTOPERATIVE DIAGNOSIS: Obstructive sleep apnea. BMI of 36  ANESTHESIA: General endotracheal.  ESTIMATED BLOOD LOSS: Less than 15 ml.  SPECIMENS: None.  DRAINS: None.  COMPLICATIONS: None.  Procedure: 12th cranial nerve (hypoglossal) stimulation implant along with placement of right pleural respiration sensor.  Electronic analysis of the implanted neurostimulator pulse generator system  Pre-Op Diagnosis: Moderate /Severe obstructive sleep apnea with positive airway pressure intolerance (ICD-10 G47.33). Post-Op Diagnosis: Moderate /Severe obstructive sleep apnea with positive pressure airway intolerance (ICD-10 G47.33).   Anesthesia: General endotracheal  Complications: None Brief Clinical History:     This is a **-year-old patient with a history of moderate /severe obstructive sleep apnea, who is intolerant and unable to achieve benefit from positive pressure therapy. Patient has passed the clinical, polysomnographic, and endoscopic screening criteria and presents today for the implant.    Procedure Description: The patient was brought to the Operating Room and was anesthetized via general endotracheal anesthesia without complication.  A shoulder roll was placed and the patient was prepped and draped in usual sterile fashion with the head turned to the left. Prior to prepping and draping, electrodes were placed in the genioglossus and styloglossus muscle and connected to the NIM box for intraoperative nerve monitoring.  A submental incision was made in the right upper neck approximately 2 cm below the mandible in the natural skin crease. Dissection was carried down through the subcutaneous tissue and platysma. The inferior border of the submandibular gland was identified as  well as the digastric tendon. The submandibular gland and the overlying fascia with the marginal mandibular nerve were retracted superiorly.  The digastric was retracted inferiorly.  Dissection was carried down into the digastric triangle where the hypoglossal nerve was identified in its usual fashion.   The mylohyoid muscle was retracted anteriorally, and the hypoglossal nerve was dissected up towards the floor of the mouth.  The lateral branches to retrusor muscles were identified, and tested intra-operatively using the NIM stimulator.  The cuff electrode for the hypoglossal nerve stimulator was placed distally to these branches on the medial nerve branch to the genioglossus muscle. Diagnostic evaluation confirmed activation of the genioglossus nerve, resulting in genioglossal activation and tongue protrusion, confirmed both visually and on NIM monitor. The stimulation electrode was then secured to the digastric tendon on its lateral surface with the provided anchor.  A second 5 cm incision was made in the right upper chest approximately 3 cm below the clavicle.  Dissection was carried down to the pectoralis muscle. An inferior pocket was created deep to the subcutaneous layer and superficial to the pectoralis major muscle.  In the upper portion of our chest pocket, pectoralis muscle fibers were then divided until we encountered anterior chest wall. External intercostal muscle was visualized and dissected until internal intercostal was visualized. Our respiratory sense lead was then tunneled in the fascial plane between external and internal intercostals. The distal anchor was secured to the anterior chest wall using 3-2.0 silk sutures and the proximal anchor was secured to the pec major facscia.  The stimulation lead was then tunneled in a subplatysmal plane and brought out into the sub-clavicular pocket.  Pulse generator was then brought onto the field and the sense and stimulation pins were both connected  to the appropriate headers and secured using a torque screwdriver. The implantable pulse generator was placed in the subclavicular pocket and secured loosely to the pectoralis fascia using non-resorbable sutures.    Diagnostic evaluation of the system was run, confirming good respiratory wave from the sensing lead, good anterior tongue protrusion with stimulation, and normal impedence measurements.  All the wounds were thoroughly irrigated with bacitracin irrigation.  The wounds were then closed in multiple layers with deep Vicryl sutures and a 5-0 biosyn.  Dermabond was then applied to the skin.    The patient was then awakened, extubated, and transferred to Recovery Room in stable condition. All counts correct x2.  I was present for and performed the entire procedure.  The advanced practice practitioner (APP) assisted throughout the case. The APP was essential in retraction and counter traction when needed to make the case progress smoothly. This retraction and assistance made it possible to see the tissue planes for the procedure. The assistance was needed for blood control, tissue re-approximation and assisted with closure of the incision site

## 2024-06-29 NOTE — H&P (Signed)
 Jean Nash is an 83 y.o. female.    Chief Complaint:  OSA CPAP intolerance  HPI: Patient presents today for planned elective procedure.  He/she denies any interval change in history since office visit on 05/24/24. No new illnesses or diagnoses.   Past Medical History:  Diagnosis Date   Adenomatous colon polyp    Anxiety    PHOBIAS   Arthritis    Breast cancer (HCC) 08/08/2013   right LOQ   Cataract    Chronic insomnia    Cluster headaches    history of migraines / NONE FOR SEVERAL YRS   Depression    Diabetes mellitus without complication (HCC)    Diverticulosis    Fatty liver 2011   Fibromyalgia    GERD (gastroesophageal reflux disease)    H/O hiatal hernia    History of transfusion 08/30/2013   Hx of radiation therapy 10/29/13- 12/14/13   right chest wall 5040 cGy 28 sessions, right supraclavicular/axillary region 5040 cGy 28 sessions, right chest wall boost 1000 cGy 5 sessions   Hypothyroidism    Internal hemorrhoids    Irritable bowel syndrome    Kidney stone    Lymphedema    RT ARM - WEARS SLEEVE   Macular degeneration    hole/right eye   MDD (major depressive disorder)    Osteopenia    Osteopenia    Other abnormal glucose    Other and unspecified hyperlipidemia    Pain in joint, shoulder region    Pneumonia 8032,7990   Sleep apnea    does not use CPAP   Stress incontinence, female     Past Surgical History:  Procedure Laterality Date   ABDOMINAL HYSTERECTOMY     APPENDECTOMY     BILATERAL TOTAL MASTECTOMY WITH AXILLARY LYMPH NODE DISSECTION  08/30/2013   Dr ARON   BREAST CYST ASPIRATION     9 cysts   CATARACT EXTRACTION, BILATERAL  2005/2007   CHOLECYSTECTOMY     COLONOSCOPY     COLONOSCOPY WITH PROPOFOL  N/A 11/10/2023   Procedure: COLONOSCOPY WITH PROPOFOL ;  Surgeon: Wilhelmenia Aloha Raddle., MD;  Location: [facility];  Service: Gastroenterology;  Laterality: N/A;   CYSTOCELE REPAIR     DRUG INDUCED ENDOSCOPY N/A 05/30/2024   Procedure: DRUG  INDUCED SLEEP ENDOSCOPY;  Surgeon: Okey Burns, MD;  Location: [facility];  Service: ENT;  Laterality: N/A;   ENDOSCOPIC MUCOSAL RESECTION N/A 11/10/2023   Procedure: ENDOSCOPIC MUCOSAL RESECTION;  Surgeon: Wilhelmenia Aloha Raddle., MD;  Location: [facility];  Service: Gastroenterology;  Laterality: N/A;   EVACUATION BREAST HEMATOMA Left 08/31/2013   Procedure: EVACUATION HEMATOMA BREAST;  Surgeon: Jina ARON, MD;  [facility];  Service: General;  Laterality: Left;   EYE SURGERY     to repair macular hole   FOOT ARTHROPLASTY     lt    GANGLION CYST EXCISION     rt foot   HEMORRHOID SURGERY     03/1993   HEMOSTASIS CLIP PLACEMENT  11/10/2023   Procedure: HEMOSTASIS CLIP PLACEMENT;  Surgeon: Wilhelmenia Aloha Raddle., MD;  Location: [facility];  Service: Gastroenterology;;   JOINT REPLACEMENT  03/15/11   left knee replacement   KNEE ARTHROSCOPY     /partial knee 2016/left knee 2012   MASS EXCISION  11/04/2011   Procedure: EXCISION MASS;  Surgeon: Lamar LULLA Leonor Raddle., MD;  Location: [facility];  Service: Orthopedics;  Laterality: Right;  excisional biopsy right ulna mass   MASTECTOMY W/ SENTINEL NODE BIOPSY Right  08/30/2013   Procedure: RIGHT  AXILLARY SENTINEL LYMPH NODE BIOPSY; Right Axillary Node Disection;  Surgeon: Jina Nephew, MD;  [facility];  Service: General;  Laterality: Right;  Right side nuc med 7:00    PARTIAL KNEE ARTHROPLASTY Right 11/03/2015   Procedure: RIGHT KNEE MEDIAL UNICOMPARTMENTAL ARTHROPLASTY ;  Surgeon: Dempsey Moan, MD;  Location: [facility];  Service: Orthopedics;  Laterality: Right;   POLYPECTOMY  11/10/2023   Procedure: POLYPECTOMY;  Surgeon: Mansouraty, Aloha Raddle., MD;  Location: [facility];  Service: Gastroenterology;;   RECTOCELE REPAIR     SIMPLE MASTECTOMY WITH AXILLARY SENTINEL NODE BIOPSY Left 08/30/2013   Procedure: Bilateral Breast Mastectomy ;  Surgeon: Jina Nephew, MD;  [facility];  Service:  General;  Laterality: Left;   skin tags removed     breast, panty line, neckline   SUBMUCOSAL LIFTING INJECTION  11/10/2023   Procedure: SUBMUCOSAL LIFTING INJECTION;  Surgeon: Wilhelmenia Aloha Raddle., MD;  [facility];  Service: Gastroenterology;;   TOE SURGERY     preventative crossover toe surg/right foot   TOE SURGERY  2009   left foot/screw  in 2nd toe   TONSILLECTOMY     UPPER GASTROINTESTINAL ENDOSCOPY      Family History  Problem Relation Age of Onset   Stroke Mother        died age 78   Diabetes Mother    Breast cancer Mother 16   Breast cancer Sister 38   Brain cancer Maternal Uncle 8   Breast cancer Paternal Aunt 87   Breast cancer Paternal Aunt        dx in her 66s   Cancer Maternal Grandmother        intra-abdominal cancer   Diabetes Maternal Grandfather    Breast cancer Paternal Grandmother 53   Brain cancer Cousin 66       maternal cousin   Brain cancer Cousin 20       paternal cousin   Colon cancer Neg Hx    Colon polyps Neg Hx    Crohn's disease Neg Hx    Esophageal cancer Neg Hx    Rectal cancer Neg Hx    Stomach cancer Neg Hx    Ulcerative colitis Neg Hx    Inflammatory bowel disease Neg Hx    Liver disease Neg Hx    Pancreatic cancer Neg Hx     Social History:  reports that she quit smoking about 63 years ago. Her smoking use included cigarettes. She started smoking about 64 years ago. She has a 0.2 pack-year smoking history. She has been exposed to tobacco smoke. She has never used smokeless tobacco. She reports that she does not currently use alcohol. She reports that she does not use drugs.  Allergies: No Known Allergies  Medications Prior to Admission  Medication Sig Dispense Refill   acidophilus (RISAQUAD) CAPS capsule Take 1 capsule by mouth daily.     Cholecalciferol (VITAMIN D ) 50 MCG (2000 UT) tablet Take 2,000 Units by mouth daily.     DULoxetine  (CYMBALTA ) 30 MG capsule Take 1 capsule (30 mg total) by mouth every evening.  90 capsule 0   DULoxetine  (CYMBALTA ) 60 MG capsule Take 60 mg by mouth daily.     esomeprazole  (NEXIUM ) 40 MG capsule TAKE 1 CAPSULE BY MOUTH DAILY AT NOON (Patient taking differently: Take 40 mg by mouth in the morning.) 365 capsule 0   famotidine  (PEPCID ) 40 MG tablet TAKE 1 TABLET BY MOUTH AT BEDTIME 90 tablet 1  levothyroxine  (SYNTHROID ) 100 MCG tablet TAKE 1 TABLET BY MOUTH DAILY 365 tablet 0   metFORMIN  (GLUCOPHAGE -XR) 500 MG 24 hr tablet Take 1 tablet (500 mg total) by mouth daily with breakfast. 90 tablet 0   Multiple Vitamins-Minerals (PRESERVISION AREDS 2 PO) Take 1 capsule by mouth in the morning and at bedtime.     omega-3 acid ethyl esters (LOVAZA ) 1 g capsule TAKE 1 CAPSULE BY MOUTH TWICE A DAY 180 capsule 3   zonisamide (ZONEGRAN) 50 MG capsule Take 150 mg by mouth daily with supper.     Blood Glucose Monitoring Suppl DEVI 1 each by Does not apply route in the morning, at noon, and at bedtime. May substitute to any manufacturer covered by patient's insurance. 1 each 0   chlorproMAZINE (THORAZINE) 25 MG tablet Take 12.5-25 mg by mouth 3 (three) times daily as needed (headache).     Glucose Blood (BLOOD GLUCOSE TEST STRIPS) STRP 1 each by In Vitro route in the morning, at noon, and at bedtime. May substitute to any manufacturer covered by patient's insurance. 300 each 0   Lancets Misc. MISC 1 each by Does not apply route in the morning, at noon, and at bedtime. May substitute to any manufacturer covered by patient's insurance. 300 each 3    Results for orders placed or performed during the hospital encounter of 06/29/24 (from the past 48 hours)  Glucose, capillary     Status: Abnormal   Collection Time: 06/29/24 11:18 AM  Result Value Ref Range   Glucose-Capillary 127 (H) 70 - 99 mg/dL    Comment: Glucose reference range applies only to samples taken after fasting for at least 8 hours.   *Note: Due to a large number of results and/or encounters for the requested time period, some  results have not been displayed. A complete set of results can be found in Results Review.   No results found.  ROS:  ROS: Constitutional: Negative for fever, weight loss and weight gain. Cardiovascular: Negative for chest pain and dyspnea on exertion. Respiratory: Is not experiencing shortness of breath at rest. Gastrointestinal: Negative for nausea and vomiting. Neurological: Negative for headaches. Psychiatric: The patient is not nervous/anxious   Blood pressure (!) 144/92, pulse 90, temperature 98.6 F (37 C), temperature source Oral, resp. rate 18, height 4' 11 (1.499 m), weight 83 kg, SpO2 98%.  PHYSICAL EXAM: Exam: General: Well-developed, well-nourished Respiratory Respiratory effort: Equal inspiration and expiration without stridor Cardiovascular Peripheral Vascular: Warm extremities with equal color/perfusion Eyes: No nystagmus with equal extraocular motion bilaterally Neuro/Psych/Balance: Patient oriented to person, place, and time; Appropriate mood and affect; Gait is intact with no imbalance; Cranial nerves I-XII are intact Head and Face Inspection: Normocephalic and atraumatic without mass or lesion Palpation: Facial skeleton intact without bony stepoffs Salivary Glands: No mass or tenderness  Facial Strength: Facial motility symmetric and full bilaterally ENT Pinna: External ear intact and fully developed External canal: Canal is patent with intact skin Tympanic Membrane: Clear and mobile External Nose: No scar or anatomic deformity Internal Nose: Septum is relatively straight on anterior rhinoscopy. Bilateral inferior turbinate hypertrophy.  Lips, Teeth, and gums: Mucosa and teeth intact and viable Oral cavity/oropharynx: No erythema or exudate, no lesions present Neck Neck and Trachea: Midline trachea without mass or lesion Thyroid : No mass or nodularity Lymphatics: No lymphadenopathy    Assessment/Plan OSA CPAP intolerance  - risks and benefits of  surgery discussed and she would like to proceed with Inspire implant.     Elena  Vale Peraza 06/29/2024, 1:27 PM

## 2024-06-29 NOTE — Anesthesia Procedure Notes (Signed)
 Procedure Name: Intubation Date/Time: 06/29/2024 2:51 PM  Performed by: Cindie Donald CROME, CRNAPre-anesthesia Checklist: Patient identified, Emergency Drugs available, Suction available and Patient being monitored Patient Re-evaluated:Patient Re-evaluated prior to induction Oxygen Delivery Method: Circle system utilized Preoxygenation: Pre-oxygenation with 100% oxygen Induction Type: IV induction Ventilation: Mask ventilation without difficulty Laryngoscope Size: Mac and 3 Grade View: Grade II Tube type: Oral Tube size: 7.0 mm Number of attempts: 1 Airway Equipment and Method: Stylet and Oral airway Placement Confirmation: ETT inserted through vocal cords under direct vision, positive ETCO2 and breath sounds checked- equal and bilateral Secured at: 21 cm Tube secured with: Tape Dental Injury: Teeth and Oropharynx as per pre-operative assessment

## 2024-06-30 NOTE — Anesthesia Postprocedure Evaluation (Signed)
 Anesthesia Post Note  Patient: Jean Nash  Procedure(s) Performed: INSERTION, HYPOGLOSSAL NERVE STIMULATOR (Chest)     Patient location during evaluation: PACU Anesthesia Type: General Level of consciousness: awake and alert Pain management: pain level controlled Vital Signs Assessment: post-procedure vital signs reviewed and stable Respiratory status: spontaneous breathing, nonlabored ventilation, respiratory function stable and patient connected to nasal cannula oxygen Cardiovascular status: blood pressure returned to baseline and stable Postop Assessment: no apparent nausea or vomiting Anesthetic complications: no   No notable events documented.  Last Vitals:  Vitals:   06/29/24 1715 06/29/24 1800  BP: (!) 140/59 139/73  Pulse: 70 70  Resp: 17 15  Temp:  36.4 C  SpO2: 98% 95%    Last Pain:  Vitals:   06/29/24 1800  TempSrc:   PainSc: 3                  [name] S

## 2024-07-02 ENCOUNTER — Ambulatory Visit: Admitting: Physical Therapy

## 2024-07-02 ENCOUNTER — Encounter: Payer: Self-pay | Admitting: Rehabilitation

## 2024-07-02 ENCOUNTER — Encounter (HOSPITAL_COMMUNITY): Payer: Self-pay | Admitting: Otolaryngology

## 2024-07-04 ENCOUNTER — Encounter: Admitting: Physical Therapy

## 2024-07-09 ENCOUNTER — Encounter: Admitting: Physical Therapy

## 2024-07-10 ENCOUNTER — Encounter (INDEPENDENT_AMBULATORY_CARE_PROVIDER_SITE_OTHER): Admitting: Otolaryngology

## 2024-07-11 ENCOUNTER — Encounter: Admitting: Physical Therapy

## 2024-07-12 ENCOUNTER — Encounter (INDEPENDENT_AMBULATORY_CARE_PROVIDER_SITE_OTHER): Admitting: Otolaryngology

## 2024-07-12 ENCOUNTER — Telehealth (INDEPENDENT_AMBULATORY_CARE_PROVIDER_SITE_OTHER): Payer: Self-pay | Admitting: Otolaryngology

## 2024-07-12 NOTE — Telephone Encounter (Signed)
 The patient's spouse called in this morning reporting that the patient's mouth is so swollen she is unable to eat.  We are scheduling her an appointment on Sept 4th a 9:00 am on your schedule.  They are both concerned about her inability to eat.  Please advise on any other steps to assist in this matter.

## 2024-07-17 ENCOUNTER — Encounter: Admitting: Physical Therapy

## 2024-07-18 ENCOUNTER — Other Ambulatory Visit: Payer: Self-pay | Admitting: Physician Assistant

## 2024-07-19 ENCOUNTER — Ambulatory Visit (INDEPENDENT_AMBULATORY_CARE_PROVIDER_SITE_OTHER): Admitting: Otolaryngology

## 2024-07-19 ENCOUNTER — Encounter: Admitting: Physical Therapy

## 2024-07-19 ENCOUNTER — Encounter (INDEPENDENT_AMBULATORY_CARE_PROVIDER_SITE_OTHER): Payer: Self-pay | Admitting: Otolaryngology

## 2024-07-19 VITALS — BP 127/84 | HR 80

## 2024-07-19 DIAGNOSIS — G4733 Obstructive sleep apnea (adult) (pediatric): Secondary | ICD-10-CM

## 2024-07-19 DIAGNOSIS — Z789 Other specified health status: Secondary | ICD-10-CM

## 2024-07-19 DIAGNOSIS — Z9889 Other specified postprocedural states: Secondary | ICD-10-CM

## 2024-07-19 NOTE — Progress Notes (Signed)
 ENT Progress Note:   Update 07/19/2024  Discussed the use of AI scribe software for clinical note transcription with the patient, who gave verbal consent to proceed.  History of Present Illness  Jean Nash is an 83 year old female who presents for post-op f/u after Inspire implant.   She has been experiencing difficulty swallowing since her recent surgery. Initially, she had difficulty swallowing, but this has been improving over the last couple of days and she is tolerating regular diet now.  No prior history of swallowing difficulties is noted.  She mentions feeling tired and having difficulty keeping her eyes open, which affects her ability to sleep at night.    Records Reviewed:  Initial Evaluation  Reason for Consult: OSA CPAP intolerance   HPI: Discussed the use of AI scribe software for clinical note transcription with the patient, who gave verbal consent to proceed.  History of Present Illness Jean Nash is an 83 year old female with long-standing severe sleep apnea who presents with CPAP intolerance. She is accompanied by her husband.  She initially used a CPAP machine successfully, which coincided with significant weight loss. However, after discontinuing the CPAP due to weight loss, she regained the weight and has not resumed CPAP use. She reports discomfort with pressure on her face and poor quality of sleep with CPAP mask in place.   No history of strokes or heart attacks. She is not currently on blood thinners.No insomnia hx. Here to discuss Inspire.     Records Reviewed:  Jean Nash Office Visit for OSA 01/19/24 female nonsmoker followed for OSA, insomnia, complicated by hypothyroid, asthma, GERD, psoriasis, CA L breast/ biMastectomy  Home Sleep Test 03/06/2015- severe obstructive sleep apnea/hypopnea syndrome, AHI 44.9 per hour with desaturation to 74% and mean saturation only 89% on room air. Body weight 190 pounds   [facility] 4/24    Past Medical History:   Diagnosis Date   Adenomatous colon polyp    Anxiety    PHOBIAS   Arthritis    Breast cancer (HCC) 08/08/2013   right LOQ   Cataract    Chronic insomnia    Cluster headaches    history of migraines / NONE FOR SEVERAL YRS   Depression    Diabetes mellitus without complication (HCC)    Diverticulosis    Fatty liver 2011   Fibromyalgia    GERD (gastroesophageal reflux disease)    H/O hiatal hernia    History of transfusion 08/30/2013   Hx of radiation therapy 10/29/13- 12/14/13   right chest wall 5040 cGy 28 sessions, right supraclavicular/axillary region 5040 cGy 28 sessions, right chest wall boost 1000 cGy 5 sessions   Hypothyroidism    Internal hemorrhoids    Irritable bowel syndrome    Kidney stone    Lymphedema    RT ARM - WEARS SLEEVE   Macular degeneration    hole/right eye   MDD (major depressive disorder)    Osteopenia    Osteopenia    Other abnormal glucose    Other and unspecified hyperlipidemia    Pain in joint, shoulder region    Pneumonia 8032,7990   Sleep apnea    does not use CPAP   Stress incontinence, female     Past Surgical History:  Procedure Laterality Date   ABDOMINAL HYSTERECTOMY     APPENDECTOMY     BILATERAL TOTAL MASTECTOMY WITH AXILLARY LYMPH NODE DISSECTION  08/30/2013   Dr ARON   BREAST CYST ASPIRATION     9 cysts  CATARACT EXTRACTION, BILATERAL  2005/2007   CHOLECYSTECTOMY     COLONOSCOPY     COLONOSCOPY WITH PROPOFOL  N/A 11/10/2023   Procedure: COLONOSCOPY WITH PROPOFOL ;  Surgeon: Mansouraty, Aloha Raddle., MD;  Location: [facility];  Service: Gastroenterology;  Laterality: N/A;   CYSTOCELE REPAIR     DRUG INDUCED ENDOSCOPY N/A 05/30/2024   Procedure: DRUG INDUCED SLEEP ENDOSCOPY;  Surgeon: Okey Burns, MD;  Location: [facility];  Service: ENT;  Laterality: N/A;   ENDOSCOPIC MUCOSAL RESECTION N/A 11/10/2023   Procedure: ENDOSCOPIC MUCOSAL RESECTION;  Surgeon: Wilhelmenia Aloha Raddle., MD;  [facility];  Service: Gastroenterology;  Laterality: N/A;   EVACUATION BREAST HEMATOMA Left 08/31/2013   Procedure: EVACUATION HEMATOMA BREAST;  Surgeon: Jina Nephew, MD;  [facility];  Service: General;  Laterality: Left;   EYE SURGERY     to repair macular hole   FOOT ARTHROPLASTY     lt    GANGLION CYST EXCISION     rt foot   HEMORRHOID SURGERY     03/1993   HEMOSTASIS CLIP PLACEMENT  11/10/2023   Procedure: HEMOSTASIS CLIP PLACEMENT;  Surgeon: Wilhelmenia Aloha Raddle., MD;  Location: [facility];  Service: Gastroenterology;;   IMPLANTATION OF HYPOGLOSSAL NERVE STIMULATOR N/A 06/29/2024   Procedure: INSERTION, HYPOGLOSSAL NERVE STIMULATOR;  Surgeon: Okey Burns, MD;  [facility];  Service: ENT;  Laterality: N/A;   JOINT REPLACEMENT  03/15/11   left knee replacement   KNEE ARTHROSCOPY     /partial knee 2016/left knee 2012   MASS EXCISION  11/04/2011   Procedure: EXCISION MASS;  Surgeon: Lamar LULLA Leonor Raddle., MD;  Location: [facility];  Service: Orthopedics;  Laterality: Right;  excisional biopsy right ulna mass   MASTECTOMY W/ SENTINEL NODE BIOPSY Right 08/30/2013   Procedure: RIGHT  AXILLARY SENTINEL LYMPH NODE BIOPSY; Right Axillary Node Disection;  Surgeon: Jina Nephew, MD;  [facility];  Service: General;  Laterality: Right;  Right side nuc med 7:00    PARTIAL KNEE ARTHROPLASTY Right 11/03/2015   Procedure: RIGHT KNEE MEDIAL UNICOMPARTMENTAL ARTHROPLASTY ;  Surgeon: Dempsey Moan, MD;  [facility];  Service: Orthopedics;  Laterality: Right;   POLYPECTOMY  11/10/2023   Procedure: POLYPECTOMY;  Surgeon: Mansouraty, Aloha Raddle., MD;  Location: [facility];  Service: Gastroenterology;;   RECTOCELE REPAIR     SIMPLE MASTECTOMY WITH AXILLARY SENTINEL NODE BIOPSY Left 08/30/2013   Procedure: Bilateral Breast Mastectomy ;  Surgeon: Jina Nephew, MD;  [facility];  Service: General;  Laterality: Left;   skin tags removed     breast, panty line,  neckline   SUBMUCOSAL LIFTING INJECTION  11/10/2023   Procedure: SUBMUCOSAL LIFTING INJECTION;  Surgeon: Wilhelmenia Aloha Raddle., MD;  [facility];  Service: Gastroenterology;;   TOE SURGERY     preventative crossover toe surg/right foot   TOE SURGERY  2009   left foot/screw  in 2nd toe   TONSILLECTOMY     UPPER GASTROINTESTINAL ENDOSCOPY      Family History  Problem Relation Age of Onset   Stroke Mother        died age 69   Diabetes Mother    Breast cancer Mother 64   Breast cancer Sister 72   Brain cancer Maternal Uncle 8   Breast cancer Paternal Aunt 31   Breast cancer Paternal Aunt        dx in her 2s   Cancer Maternal Grandmother        intra-abdominal  cancer   Diabetes Maternal Grandfather    Breast cancer Paternal Grandmother 67   Brain cancer Cousin 65       maternal cousin   Brain cancer Cousin 20       paternal cousin   Colon cancer Neg Hx    Colon polyps Neg Hx    Crohn's disease Neg Hx    Esophageal cancer Neg Hx    Rectal cancer Neg Hx    Stomach cancer Neg Hx    Ulcerative colitis Neg Hx    Inflammatory bowel disease Neg Hx    Liver disease Neg Hx    Pancreatic cancer Neg Hx     Social History:  reports that she quit smoking about 63 years ago. Her smoking use included cigarettes. She started smoking about 64 years ago. She has a 0.2 pack-year smoking history. She has been exposed to tobacco smoke. She has never used smokeless tobacco. She reports that she does not currently use alcohol. She reports that she does not use drugs.  Allergies: No Known Allergies  Medications: I have reviewed the patient's current medications.  The PMH, PSH, Medications, Allergies, and SH were reviewed and updated.  ROS: Constitutional: Negative for fever, weight loss and weight gain. Cardiovascular: Negative for chest pain and dyspnea on exertion. Respiratory: Is not experiencing shortness of breath at rest. Gastrointestinal: Negative for nausea and  vomiting. Neurological: Negative for headaches. Psychiatric: The patient is not nervous/anxious  Blood pressure 127/84, pulse 80, SpO2 92%. There is no height or weight on file to calculate BMI.  PHYSICAL EXAM:  Exam: General: Well-developed, well-nourished Respiratory Respiratory effort: Equal inspiration and expiration without stridor Cardiovascular Peripheral Vascular: Warm extremities with equal color/perfusion Eyes: No nystagmus with equal extraocular motion bilaterally Neuro/Psych/Balance: Patient oriented to person, place, and time; Appropriate mood and affect; Gait is intact with no imbalance; Cranial nerves I-XII are intact Head and Face Inspection: Normocephalic and atraumatic without mass or lesion Chest: incision is c/d/I Submental Incision is c/d/I CN XII intact no floor of mouth edema   Studies Reviewed: Home sleep study 03/22/24    Assessment/Plan: No diagnosis found.  Assessment & Plan Obstructive Sleep Apnea AHI of 33.7 and no significant central mixed apneas on HST done on 03/22/24 BMI of 35.58.  OSA, moderate to severe, without multilevel collapse, with failure to tolerate PAP therapy and/or more conservative measures. Presence of smaller/absent tonsils and larger tongue position (Friedman tongue position or modified Mallampati) suggests that hypopharyngeal/retrolingual collapse is contributing to the patient's OSA. Janeth, M et al. Staging of obstructive sleep apnea/hypopnea syndrome: a guide to appropriate treatment. Laryngoscope, 2004 Mar, 114(3):454-9. PMID: [id number]) Options including positional therapy, weight loss, oral appliances, PAP and surgical correction discussed. Pt is not ideal candidate for oral appliance due to severity of OSA Pt could be a candidate for Hypoglossal nerve stimulation (Inspire therapy) pending DISE to rule out [facility]  - Schedule drug-induced sleep endoscopy. - Advised on postoperative care: avoid strenuous activity for a month,  expect minimal pain and sore throat for a few days. - Instructed to avoid swimming until follow-up, approximately one to two weeks post-surgery.  Update 07/19/24 Status post Inspire Implant  Initially had trouble swallowing, but that has resolved since her surgery - Proceed with activation appointment on September 19th. - Advise light activity, including walking in the pool. - Avoid strenuous activities and heavy lifting. - Schedule follow-up only if issues arise.   Thank you for allowing me to participate in the care of  this patient. Please do not hesitate to contact me with any questions or concerns.   Elena Larry, MD Otolaryngology [facility] Phone: [phone] Fax: [phone]    07/19/2024, 8:54 AM

## 2024-07-24 ENCOUNTER — Encounter: Payer: Self-pay | Admitting: Adult Health

## 2024-07-26 ENCOUNTER — Ambulatory Visit: Admitting: Adult Health

## 2024-07-26 ENCOUNTER — Encounter: Payer: Self-pay | Admitting: Adult Health

## 2024-07-26 VITALS — BP 120/70 | HR 88 | Temp 98.0°F | Ht 59.0 in | Wt 176.0 lb

## 2024-07-26 DIAGNOSIS — Z8669 Personal history of other diseases of the nervous system and sense organs: Secondary | ICD-10-CM

## 2024-07-26 DIAGNOSIS — E860 Dehydration: Secondary | ICD-10-CM | POA: Diagnosis not present

## 2024-07-26 DIAGNOSIS — G8929 Other chronic pain: Secondary | ICD-10-CM

## 2024-07-26 DIAGNOSIS — F329 Major depressive disorder, single episode, unspecified: Secondary | ICD-10-CM

## 2024-07-26 DIAGNOSIS — M25561 Pain in right knee: Secondary | ICD-10-CM

## 2024-07-26 DIAGNOSIS — M25562 Pain in left knee: Secondary | ICD-10-CM

## 2024-07-26 DIAGNOSIS — R630 Anorexia: Secondary | ICD-10-CM | POA: Diagnosis not present

## 2024-07-26 DIAGNOSIS — G4733 Obstructive sleep apnea (adult) (pediatric): Secondary | ICD-10-CM

## 2024-07-26 LAB — CBC
HCT: 42.8 % (ref 36.0–46.0)
Hemoglobin: 14.3 g/dL (ref 12.0–15.0)
MCHC: 33.3 g/dL (ref 30.0–36.0)
MCV: 87.9 fl (ref 78.0–100.0)
Platelets: 213 K/uL (ref 150.0–400.0)
RBC: 4.88 Mil/uL (ref 3.87–5.11)
RDW: 13.5 % (ref 11.5–15.5)
WBC: 7.9 K/uL (ref 4.0–10.5)

## 2024-07-26 LAB — BASIC METABOLIC PANEL WITH GFR
BUN: 15 mg/dL (ref 6–23)
CO2: 24 meq/L (ref 19–32)
Calcium: 9.8 mg/dL (ref 8.4–10.5)
Chloride: 105 meq/L (ref 96–112)
Creatinine, Ser: 0.73 mg/dL (ref 0.40–1.20)
GFR: 77.2 mL/min (ref >60.00)
Glucose, Bld: 157 mg/dL — ABNORMAL HIGH (ref 70–99)
Potassium: 3.5 meq/L (ref 3.5–5.1)
Sodium: 139 meq/L (ref 135–145)

## 2024-07-26 NOTE — Progress Notes (Signed)
 Subjective:    [name], female    DOB: [birth year], 83 y.o.   MRN: [id number]  HPI 83 year old female who  has a past medical history of Adenomatous colon polyp, Anxiety, Arthritis, Breast cancer (HCC) (08/08/2013), Cataract, Chronic insomnia, Cluster headaches, Depression, Diabetes mellitus without complication (HCC), Diverticulosis, Fatty liver (2011), Fibromyalgia, GERD (gastroesophageal reflux disease), H/O hiatal hernia, History of transfusion (08/30/2013), radiation therapy (10/29/13- 12/14/13), Hypothyroidism, Internal hemorrhoids, Irritable bowel syndrome, Kidney stone, Lymphedema, Macular degeneration, MDD (major depressive disorder), Osteopenia, Osteopenia, Other abnormal glucose, Other and unspecified hyperlipidemia, Pain in joint, shoulder region, Pneumonia (8032,7990), Sleep apnea, and Stress incontinence, female.  She presents to the office today with her husband and her daughter is on facetime video.   Family reports that Mirha has been going through episodes where she will have a few good days where she does not have a headache, has a good appetite and is hydrating.  She then falls into the episodes where she has severe headaches for multiple days in a row, does not get out of bed, sleeps throughout the day and does not eat or drink anything, this can go on for multiple days  Headache clinic currently has her on Thorazine 3 times daily PRN for headaches but she feels as though this has not been working for her.  She also had the inspire device placed due to severe sleep apnea and has this activated late next week.  She is seeing psychiatry as well next week for follow-up regarding major depressive disorder, she is currently on Cymbalta  30 mg nightly.  Her husband reports that over the for the last day and a half that he has been drinking and eating 3 meals a day.  She went outside yesterday which had been the first time in quite some time that she had been out of the  house.  She reports not having a headache today.  She also has chronic knee pain, was going to physical therapy but was not able to make all of her appointments due to her headaches.  Her husband is going through chemotherapy for pancreatic cancer and they are wondering if home health PT can be ordered  Review of Systems See HPI   Past Medical History:  Diagnosis Date   Adenomatous colon polyp    Anxiety    PHOBIAS   Arthritis    Breast cancer (HCC) 08/08/2013   right LOQ   Cataract    Chronic insomnia    Cluster headaches    history of migraines / NONE FOR SEVERAL YRS   Depression    Diabetes mellitus without complication (HCC)    Diverticulosis    Fatty liver 2011   Fibromyalgia    GERD (gastroesophageal reflux disease)    H/O hiatal hernia    History of transfusion 08/30/2013   Hx of radiation therapy 10/29/13- 12/14/13   right chest wall 5040 cGy 28 sessions, right supraclavicular/axillary region 5040 cGy 28 sessions, right chest wall boost 1000 cGy 5 sessions   Hypothyroidism    Internal hemorrhoids    Irritable bowel syndrome    Kidney stone    Lymphedema    RT ARM - WEARS SLEEVE   Macular degeneration    hole/right eye   MDD (major depressive disorder)    Osteopenia    Osteopenia    Other abnormal glucose    Other and unspecified hyperlipidemia    Pain in joint, shoulder region    Pneumonia 8032,7990  Sleep apnea    does not use CPAP   Stress incontinence, female     Social History   Socioeconomic History   Marital status: Married    Spouse name: Ubaldo   Number of children: 3   Years of education: Not on file   Highest education level: Some college, no degree  Occupational History   Occupation: retired Catering manager  Tobacco Use   Smoking status: Former    Current packs/day: 0.00    Average packs/day: 0.1 packs/day for 2.0 years (0.2 ttl pk-yrs)    Types: Cigarettes    Start date: 11/16/1959    Quit date: 11/15/1960    Years since quitting: 63.7     Passive exposure: Past (over 58 years ago)   Smokeless tobacco: Never  Vaping Use   Vaping status: Never Used  Substance and Sexual Activity   Alcohol use: Not Currently   Drug use: No   Sexual activity: Yes    Comment: menarche age 57, fist live birth 85, P 3, hysterectomy age 57, no HRT, BCP 2 yrs  Other Topics Concern   Not on file  Social History Narrative   Occupation:  Retired Catering manager    Married with 3 grown children      Never Smoked     Alcohol use-no        Social Drivers of Corporate investment banker Strain: Low Risk  (06/23/2024)   Overall Financial Resource Strain (CARDIA)    Difficulty of Paying Living Expenses: Not hard at all  Food Insecurity: No Food Insecurity (06/23/2024)   Hunger Vital Sign    Worried About Running Out of Food in the Last Year: Never true    Ran Out of Food in the Last Year: Never true  Transportation Needs: No Transportation Needs (06/23/2024)   PRAPARE - Administrator, Civil Service (Medical): No    Lack of Transportation (Non-Medical): No  Physical Activity: Inactive (06/23/2024)   Exercise Vital Sign    Days of Exercise per Week: 0 days    Minutes of Exercise per Session: Not on file  Stress: Stress Concern Present (06/23/2024)   [employer] of Occupational Health - Occupational Stress Questionnaire    Feeling of Stress: To some extent  Social Connections: Socially Integrated (06/23/2024)   Social Connection and Isolation Panel    Frequency of Communication with Friends and Family: More than three times a week    Frequency of Social Gatherings with Friends and Family: More than three times a week    Attends Religious Services: 1 to 4 times per year    Active Member of [employer] or Organizations: Yes    Attends Banker Meetings: 1 to 4 times per year    Marital Status: Married  Catering manager Violence: Not At Risk (02/09/2023)   Humiliation, Afraid, Rape, and Kick questionnaire    Fear of Current or Ex-Partner:  No    Emotionally Abused: No    Physically Abused: No    Sexually Abused: No    Past Surgical History:  Procedure Laterality Date   ABDOMINAL HYSTERECTOMY     APPENDECTOMY     BILATERAL TOTAL MASTECTOMY WITH AXILLARY LYMPH NODE DISSECTION  08/30/2013   Dr ARON   BREAST CYST ASPIRATION     9 cysts   CATARACT EXTRACTION, BILATERAL  2005/2007   CHOLECYSTECTOMY     COLONOSCOPY     COLONOSCOPY WITH PROPOFOL  N/A 11/10/2023   Procedure: COLONOSCOPY WITH PROPOFOL ;  Surgeon:  Mansouraty, Aloha Raddle., MD;  Location: [facility];  Service: Gastroenterology;  Laterality: N/A;   CYSTOCELE REPAIR     DRUG INDUCED ENDOSCOPY N/A 05/30/2024   Procedure: DRUG INDUCED SLEEP ENDOSCOPY;  Surgeon: Okey Burns, MD;  Location: [facility];  Service: ENT;  Laterality: N/A;   ENDOSCOPIC MUCOSAL RESECTION N/A 11/10/2023   Procedure: ENDOSCOPIC MUCOSAL RESECTION;  Surgeon: Wilhelmenia Aloha Raddle., MD;  Location: [facility];  Service: Gastroenterology;  Laterality: N/A;   EVACUATION BREAST HEMATOMA Left 08/31/2013   Procedure: EVACUATION HEMATOMA BREAST;  Surgeon: Jina Nephew, MD;  Location: [facility];  Service: General;  Laterality: Left;   EYE SURGERY     to repair macular hole   FOOT ARTHROPLASTY     lt    GANGLION CYST EXCISION     rt foot   HEMORRHOID SURGERY     03/1993   HEMOSTASIS CLIP PLACEMENT  11/10/2023   Procedure: HEMOSTASIS CLIP PLACEMENT;  Surgeon: Wilhelmenia Aloha Raddle., MD;  Location: [facility];  Service: Gastroenterology;;   IMPLANTATION OF HYPOGLOSSAL NERVE STIMULATOR N/A 06/29/2024   Procedure: INSERTION, HYPOGLOSSAL NERVE STIMULATOR;  Surgeon: Okey Burns, MD;  Location: [facility];  Service: ENT;  Laterality: N/A;   JOINT REPLACEMENT  03/15/11   left knee replacement   KNEE ARTHROSCOPY     /partial knee 2016/left knee 2012   MASS EXCISION  11/04/2011   Procedure: EXCISION MASS;  Surgeon: Lamar LULLA Leonor Raddle., MD;  Location: [facility];  Service:  Orthopedics;  Laterality: Right;  excisional biopsy right ulna mass   MASTECTOMY W/ SENTINEL NODE BIOPSY Right 08/30/2013   Procedure: RIGHT  AXILLARY SENTINEL LYMPH NODE BIOPSY; Right Axillary Node Disection;  Surgeon: Jina Nephew, MD;  Location: [facility];  Service: General;  Laterality: Right;  Right side nuc med 7:00    PARTIAL KNEE ARTHROPLASTY Right 11/03/2015   Procedure: RIGHT KNEE MEDIAL UNICOMPARTMENTAL ARTHROPLASTY ;  Surgeon: Dempsey Moan, MD;  Location: [facility];  Service: Orthopedics;  Laterality: Right;   POLYPECTOMY  11/10/2023   Procedure: POLYPECTOMY;  Surgeon: Mansouraty, Aloha Raddle., MD;  Location: [facility];  Service: Gastroenterology;;   RECTOCELE REPAIR     SIMPLE MASTECTOMY WITH AXILLARY SENTINEL NODE BIOPSY Left 08/30/2013   Procedure: Bilateral Breast Mastectomy ;  Surgeon: Jina Nephew, MD;  Location: [facility];  Service: General;  Laterality: Left;   skin tags removed     breast, panty line, neckline   SUBMUCOSAL LIFTING INJECTION  11/10/2023   Procedure: SUBMUCOSAL LIFTING INJECTION;  Surgeon: Wilhelmenia Aloha Raddle., MD;  Location: [facility];  Service: Gastroenterology;;   TOE SURGERY     preventative crossover toe surg/right foot   TOE SURGERY  2009   left foot/screw  in 2nd toe   TONSILLECTOMY     UPPER GASTROINTESTINAL ENDOSCOPY      Family History  Problem Relation Age of Onset   Stroke Mother        died age 36   Diabetes Mother    Breast cancer Mother 71   Breast cancer Sister 34   Brain cancer Maternal Uncle 8   Breast cancer Paternal Aunt 17   Breast cancer Paternal Aunt        dx in her 61s   Cancer Maternal Grandmother        intra-abdominal cancer   Diabetes Maternal Grandfather    Breast cancer Paternal Grandmother 59   Brain cancer Cousin 2       maternal cousin   Brain  cancer Cousin 20       paternal cousin   Colon cancer Neg Hx    Colon polyps Neg Hx    Crohn's disease Neg Hx    Esophageal cancer Neg Hx    Rectal cancer Neg Hx     Stomach cancer Neg Hx    Ulcerative colitis Neg Hx    Inflammatory bowel disease Neg Hx    Liver disease Neg Hx    Pancreatic cancer Neg Hx     No Known Allergies  Current Outpatient Medications on File Prior to Visit  Medication Sig Dispense Refill   acetaminophen  (TYLENOL ) 500 MG tablet Take 1 tablet (500 mg total) by mouth every 6 (six) hours. 30 tablet 0   acidophilus (RISAQUAD) CAPS capsule Take 1 capsule by mouth daily.     Blood Glucose Monitoring Suppl DEVI 1 each by Does not apply route in the morning, at noon, and at bedtime. May substitute to any manufacturer covered by patient's insurance. 1 each 0   chlorproMAZINE (THORAZINE) 25 MG tablet Take 12.5-25 mg by mouth 3 (three) times daily as needed (headache).     Cholecalciferol (VITAMIN D ) 50 MCG (2000 UT) tablet Take 2,000 Units by mouth daily.     DULoxetine  (CYMBALTA ) 30 MG capsule Take 1 capsule (30 mg total) by mouth every evening. 90 capsule 0   DULoxetine  (CYMBALTA ) 60 MG capsule Take 60 mg by mouth daily.     esomeprazole  (NEXIUM ) 40 MG capsule TAKE 1 CAPSULE BY MOUTH DAILY AT NOON (Patient taking differently: Take 40 mg by mouth in the morning.) 365 capsule 0   famotidine  (PEPCID ) 40 MG tablet TAKE 1 TABLET BY MOUTH AT BEDTIME 90 tablet 1   Glucose Blood (BLOOD GLUCOSE TEST STRIPS) STRP 1 each by In Vitro route in the morning, at noon, and at bedtime. May substitute to any manufacturer covered by patient's insurance. 300 each 0   Lancets Misc. MISC 1 each by Does not apply route in the morning, at noon, and at bedtime. May substitute to any manufacturer covered by patient's insurance. 300 each 3   levothyroxine  (SYNTHROID ) 100 MCG tablet TAKE 1 TABLET BY MOUTH DAILY 365 tablet 0   metFORMIN  (GLUCOPHAGE -XR) 500 MG 24 hr tablet Take 1 tablet (500 mg total) by mouth daily with breakfast. 90 tablet 0   Multiple Vitamins-Minerals (PRESERVISION AREDS 2 PO) Take 1 capsule by mouth in the morning and at bedtime.     omega-3  acid ethyl esters (LOVAZA ) 1 g capsule TAKE 1 CAPSULE BY MOUTH TWICE A DAY 180 capsule 3   oxyCODONE  (ROXICODONE ) 5 MG immediate release tablet Take 1 tablet (5 mg total) by mouth every 6 (six) hours as needed for severe pain (pain score 7-10) or breakthrough pain. Crush and take with yogurt if painful to swallow pills 25 tablet 0   zonisamide (ZONEGRAN) 50 MG capsule Take 150 mg by mouth daily with supper.     amoxicillin -clavulanate (AUGMENTIN ) 875-125 MG tablet Take 1 tablet by mouth 2 (two) times daily. 20 tablet 0   No current facility-administered medications on file prior to visit.    BP 120/70   Pulse 88   Temp 98 F (36.7 C) (Oral)   Ht 4' 11 (1.499 m)   Wt 176 lb (79.8 kg)   SpO2 95%   BMI 35.55 kg/m       Objective:   Physical Exam Vitals and nursing note reviewed.  Constitutional:      Appearance: Normal appearance.  Cardiovascular:     Rate and Rhythm: Normal rate and regular rhythm.     Pulses: Normal pulses.     Heart sounds: Normal heart sounds.  Pulmonary:     Effort: Pulmonary effort is normal.     Breath sounds: Normal breath sounds.  Musculoskeletal:        General: Normal range of motion.  Skin:    General: Skin is warm and dry.  Neurological:     General: No focal deficit present.     Mental Status: She is alert and oriented to person, place, and time.     Gait: Gait abnormal.  Psychiatric:        Mood and Affect: Mood normal.        Behavior: Behavior normal.        Thought Content: Thought content normal.        Judgment: Judgment normal.        Assessment & Plan:  1. Loss of appetite (Primary)  - Basic Metabolic Panel; Future - CBC; Future - CBC - Basic Metabolic Panel  2. Dehydration - Basic Metabolic Panel; Future - CBC; Future - CBC - Basic Metabolic Panel  3. Hx of migraine headaches - Follow up with headache clinic. I am hopeful that once the Inspire device is activated this will help with her headaches  4. Reactive  depression (situational) - Follow up with Psychiatry.  - Advised to ask about starting Remerron   5. OSA (obstructive sleep apnea) - Per pulmonary   6. Chronic pain of both knees  - Ambulatory referral to [facility]  Darleene Shape, NP  I personally spent a total of 43 minutes in the care of the patient today including preparing to see the patient, getting/reviewing separately obtained history, performing a medically appropriate exam/evaluation, counseling and educating, placing orders, documenting clinical information in the EHR, and communicating results.

## 2024-07-26 NOTE — Patient Instructions (Addendum)
 I am going to refer you to physical therapy at home   Talk to Dr. Carvin about starting a medication called Remeron to help with Depression and also stimulate your appetite

## 2024-07-27 ENCOUNTER — Ambulatory Visit: Payer: Self-pay | Admitting: Adult Health

## 2024-07-30 NOTE — Progress Notes (Deleted)
 BH MD/PA/NP OP Progress Note  07/30/2024 1:11 PM Jean Nash  MRN:  [id number]  Visit Diagnosis:  No diagnosis found.  Assessment: Jean Nash is a 83 y.o. female with a history of MDD, anxiety, and fibromyalgia who presented to [facility] at [facility] for initial evaluation on 08/11/22.    At initial evaluation patient reported a history of MDD but denied any current symptoms other than increased fatigue.  She noted that the increased fatigue seemed to correlate with when the pregabalin  was increased for her fibromyalgia.  Patient denies any other psychiatric symptoms including psychosis, paranoia, delusions, SI/HI, thoughts of self-harm, or neurovegetative signs of depression.  Patient reported struggling with chronic pain symptoms as well as migraines with some benefit from the pregabalin .  She denied any benefit from the Topamax .  Treatment options were discussed and it was decided to taper off of Effexor  and start Cymbalta  for coverage of MDD and fibromyalgia symptoms.     Jean Nash presents for follow-up evaluation. Today, 07/30/24,    mood wise patient has been stable.  She still struggles with excessive fatigue, sleep, memory concerns, and amotivation.  She has done a bit better with the behavioral activation in the interim.  Notably patient completed sleep study and does have severe sleep apnea.  She is not on CPAP and was referred to ENT to look into the inspire procedure.  We did discuss dental devices today as well.  Patient can discuss this further with her pulmonologist if desired.  The symptoms described could all be related to her untreated severe sleep apnea.  That being the case patient would benefit most from treating this.  We will continue on her current regimen and follow up in 3 months.  Patient did complete MoCA today and scored a 25 out of 30.  She had deficiencies in delayed recall and orientation.  Neuropsych testing referral was  placed.  Psychotherapeutic interventions were used during today's session. From 11:36 AM to 11:59 AM. Therapeutic interventions included empathic listening, supportive therapy, cognitive and behavioral therapy, motivational interviewing. Used supportive interviewing techniques to provide emotional validation. Worked on cognitive reframing techniques and recommendations made for behavioral activation.  Improvement was evidenced by patient's participation and identified commitment to therapy goals.   Plan: - Continue Cymbalta  30 mg at bedtime - CBC,CMP, TSH, and UA reviewed - Neuropsych testing referral for dementia, MOCA 25 - Complete sleep study - Follow up in 2 months   Chief Complaint:  No chief complaint on file.  HPI: Jean Nash presents alongside her husband.  She reports that      the last several months have been a struggle for her.  She has been struggling with several somatic symptoms including worsening headaches that have been constant for the past month and a half.  She also still struggles with the fatigue, excessive sleep, and a newer onset of diarrhea.  Of note patient is connected with a headache specialist for her migraines.  She also recently had sleep study on which was positive for severe sleep apnea.  Patient had been diagnosed with sleep apnea in the past and could not tolerate CPAP.  She has been referred to an ENT to discuss the inspire procedure.  We also reviewed dental devices the patient could consider if the inspire procedure is not an option.  As for the GI concerns patient's husband reports that she has an active stomach ulcer which had been untreated due to lack of symptoms previously.  They have  stopped her MiraLAX  a couple days ago and patient has not yet reached out to the GI doctor.  Mood wise patient reports feelings of guilt about her decreased activity.  She has been pushing herself to go to the pool for about an hour a day.  Outside of that however she  spends most of her time sleeping.  We reviewed the severe sleep apnea and how it could contribute to the migraines, fatigue, low mood, and memory concerns.  We recommended that she pursue treatment for the sleep apnea to better clarify if there is underlying depression.  We also did complete MoCA assessment the patient today and she scored a 25 out of 30.  Her primary area of difficulty was with delayed recall and partially with orientation.  Referral was placed for neuropsych testing.  Past Psychiatric History: Patient has seen Dr. Vickey in the past Psychiatry admission: denies  Previous suicide attempt: denies Past trials of medication: citalopram , and Effexor .  Patient has also taken Topamax  for migraines and pregabalin  for small fiber neuropathy.  Past Medical History:  Past Medical History:  Diagnosis Date   Adenomatous colon polyp    Anxiety    PHOBIAS   Arthritis    Breast cancer (HCC) 08/08/2013   right LOQ   Cataract    Chronic insomnia    Cluster headaches    history of migraines / NONE FOR SEVERAL YRS   Depression    Diabetes mellitus without complication (HCC)    Diverticulosis    Fatty liver 2011   Fibromyalgia    GERD (gastroesophageal reflux disease)    H/O hiatal hernia    History of transfusion 08/30/2013   Hx of radiation therapy 10/29/13- 12/14/13   right chest wall 5040 cGy 28 sessions, right supraclavicular/axillary region 5040 cGy 28 sessions, right chest wall boost 1000 cGy 5 sessions   Hypothyroidism    Internal hemorrhoids    Irritable bowel syndrome    Kidney stone    Lymphedema    RT ARM - WEARS SLEEVE   Macular degeneration    hole/right eye   MDD (major depressive disorder)    Osteopenia    Osteopenia    Other abnormal glucose    Other and unspecified hyperlipidemia    Pain in joint, shoulder region    Pneumonia 8032,7990   Sleep apnea    does not use CPAP   Stress incontinence, female     Past Surgical History:  Procedure Laterality  Date   ABDOMINAL HYSTERECTOMY     APPENDECTOMY     BILATERAL TOTAL MASTECTOMY WITH AXILLARY LYMPH NODE DISSECTION  08/30/2013   Dr ARON   BREAST CYST ASPIRATION     9 cysts   CATARACT EXTRACTION, BILATERAL  2005/2007   CHOLECYSTECTOMY     COLONOSCOPY     COLONOSCOPY WITH PROPOFOL  N/A 11/10/2023   Procedure: COLONOSCOPY WITH PROPOFOL ;  Surgeon: Wilhelmenia Aloha Raddle., MD;  Location: [facility];  Service: Gastroenterology;  Laterality: N/A;   CYSTOCELE REPAIR     DRUG INDUCED ENDOSCOPY N/A 05/30/2024   Procedure: DRUG INDUCED SLEEP ENDOSCOPY;  Surgeon: Okey Burns, MD;  Location: [facility];  Service: ENT;  Laterality: N/A;   ENDOSCOPIC MUCOSAL RESECTION N/A 11/10/2023   Procedure: ENDOSCOPIC MUCOSAL RESECTION;  Surgeon: Wilhelmenia Aloha Raddle., MD;  Location: [facility];  Service: Gastroenterology;  Laterality: N/A;   EVACUATION BREAST HEMATOMA Left 08/31/2013   Procedure: EVACUATION HEMATOMA BREAST;  Surgeon: Jina ARON, MD;  Location: [facility];  Service:  General;  Laterality: Left;   EYE SURGERY     to repair macular hole   FOOT ARTHROPLASTY     lt    GANGLION CYST EXCISION     rt foot   HEMORRHOID SURGERY     03/1993   HEMOSTASIS CLIP PLACEMENT  11/10/2023   Procedure: HEMOSTASIS CLIP PLACEMENT;  Surgeon: Wilhelmenia Aloha Raddle., MD;  Location: [facility];  Service: Gastroenterology;;   IMPLANTATION OF HYPOGLOSSAL NERVE STIMULATOR N/A 06/29/2024   Procedure: INSERTION, HYPOGLOSSAL NERVE STIMULATOR;  Surgeon: Okey Burns, MD;  Location: [facility];  Service: ENT;  Laterality: N/A;   JOINT REPLACEMENT  03/15/11   left knee replacement   KNEE ARTHROSCOPY     /partial knee 2016/left knee 2012   MASS EXCISION  11/04/2011   Procedure: EXCISION MASS;  Surgeon: Lamar LULLA Leonor Raddle., MD;  Location: [facility];  Service: Orthopedics;  Laterality: Right;  excisional biopsy right ulna mass   MASTECTOMY W/ SENTINEL NODE BIOPSY Right 08/30/2013    Procedure: RIGHT  AXILLARY SENTINEL LYMPH NODE BIOPSY; Right Axillary Node Disection;  Surgeon: Jina Nephew, MD;  [facility]: [facility];  Service: General;  Laterality: Right;  Right side nuc med 7:00    PARTIAL KNEE ARTHROPLASTY Right 11/03/2015   Procedure: RIGHT KNEE MEDIAL UNICOMPARTMENTAL ARTHROPLASTY ;  Surgeon: Dempsey Moan, MD;  Location: [facility];  Service: Orthopedics;  Laterality: Right;   POLYPECTOMY  11/10/2023   Procedure: POLYPECTOMY;  Surgeon: Mansouraty, Aloha Raddle., MD;  Location: [facility];  Service: Gastroenterology;;   RECTOCELE REPAIR     SIMPLE MASTECTOMY WITH AXILLARY SENTINEL NODE BIOPSY Left 08/30/2013   Procedure: Bilateral Breast Mastectomy ;  Surgeon: Jina Nephew, MD;  Location[facility];  Service: General;  Laterality: Left;   skin tags removed     breast, panty line, neckline   SUBMUCOSAL LIFTING INJECTION  11/10/2023   Procedure: SUBMUCOSAL LIFTING INJECTION;  Surgeon: Wilhelmenia Aloha Raddle., MD;  Location: [facility];  Service: Gastroenterology;;   TOE SURGERY     preventative crossover toe surg/right foot   TOE SURGERY  2009   left foot/screw  in 2nd toe   TONSILLECTOMY     UPPER GASTROINTESTINAL ENDOSCOPY      Family Psychiatric History: Denies  Family History:  Family History  Problem Relation Age of Onset   Stroke Mother        died age 25   Diabetes Mother    Breast cancer Mother 12   Breast cancer Sister 29   Brain cancer Maternal Uncle 8   Breast cancer Paternal Aunt 64   Breast cancer Paternal Aunt        dx in her 66s   Cancer Maternal Grandmother        intra-abdominal cancer   Diabetes Maternal Grandfather    Breast cancer Paternal Grandmother 36   Brain cancer Cousin 43       maternal cousin   Brain cancer Cousin 20       paternal cousin   Colon cancer Neg Hx    Colon polyps Neg Hx    Crohn's disease Neg Hx    Esophageal cancer Neg Hx    Rectal cancer Neg Hx    Stomach cancer Neg Hx    Ulcerative colitis Neg Hx     Inflammatory bowel disease Neg Hx    Liver disease Neg Hx    Pancreatic cancer Neg Hx     Social History:  Social History   Socioeconomic History  Marital status: Married    Spouse name: Ubaldo   Number of children: 3   Years of education: Not on file   Highest education level: Some college, no degree  Occupational History   Occupation: retired Catering manager  Tobacco Use   Smoking status: Former    Current packs/day: 0.00    Average packs/day: 0.1 packs/day for 2.0 years (0.2 ttl pk-yrs)    Types: Cigarettes    Start date: 11/16/1959    Quit date: 11/15/1960    Years since quitting: 63.7    Passive exposure: Past (over 58 years ago)   Smokeless tobacco: Never  Vaping Use   Vaping status: Never Used  Substance and Sexual Activity   Alcohol use: Not Currently   Drug use: No   Sexual activity: Yes    Comment: menarche age 79, fist live birth 59, P 3, hysterectomy age 85, no HRT, BCP 2 yrs  Other Topics Concern   Not on file  Social History Narrative   Occupation:  Retired Catering manager    Married with 3 grown children      Never Smoked     Alcohol use-no        Social Drivers of Corporate investment banker Strain: Low Risk  (06/23/2024)   Overall Financial Resource Strain (CARDIA)    Difficulty of Paying Living Expenses: Not hard at all  Food Insecurity: No Food Insecurity (06/23/2024)   Hunger Vital Sign    Worried About Running Out of Food in the Last Year: Never true    Ran Out of Food in the Last Year: Never true  Transportation Needs: No Transportation Needs (06/23/2024)   PRAPARE - Administrator, Civil Service (Medical): No    Lack of Transportation (Non-Medical): No  Physical Activity: Inactive (06/23/2024)   Exercise Vital Sign    Days of Exercise per Week: 0 days    Minutes of Exercise per Session: Not on file  Stress: Stress Concern Present (06/23/2024)   [employer] of Occupational Health - Occupational Stress Questionnaire    Feeling of Stress: To  some extent  Social Connections: Socially Integrated (06/23/2024)   Social Connection and Isolation Panel    Frequency of Communication with Friends and Family: More than three times a week    Frequency of Social Gatherings with Friends and Family: More than three times a week    Attends Religious Services: 1 to 4 times per year    Active Member of [employer] or Organizations: Yes    Attends Banker Meetings: 1 to 4 times per year    Marital Status: Married    Allergies:  No Known Allergies   Current Medications: Current Outpatient Medications  Medication Sig Dispense Refill   acetaminophen  (TYLENOL ) 500 MG tablet Take 1 tablet (500 mg total) by mouth every 6 (six) hours. 30 tablet 0   acidophilus (RISAQUAD) CAPS capsule Take 1 capsule by mouth daily.     Blood Glucose Monitoring Suppl DEVI 1 each by Does not apply route in the morning, at noon, and at bedtime. May substitute to any manufacturer covered by patient's insurance. 1 each 0   chlorproMAZINE (THORAZINE) 25 MG tablet Take 12.5-25 mg by mouth 3 (three) times daily as needed (headache).     Cholecalciferol (VITAMIN D ) 50 MCG (2000 UT) tablet Take 2,000 Units by mouth daily.     DULoxetine  (CYMBALTA ) 30 MG capsule Take 1 capsule (30 mg total) by mouth every evening. 90 capsule 0  DULoxetine  (CYMBALTA ) 60 MG capsule Take 60 mg by mouth daily.     esomeprazole  (NEXIUM ) 40 MG capsule TAKE 1 CAPSULE BY MOUTH DAILY AT NOON (Patient taking differently: Take 40 mg by mouth in the morning.) 365 capsule 0   famotidine  (PEPCID ) 40 MG tablet TAKE 1 TABLET BY MOUTH AT BEDTIME 90 tablet 1   Glucose Blood (BLOOD GLUCOSE TEST STRIPS) STRP 1 each by In Vitro route in the morning, at noon, and at bedtime. May substitute to any manufacturer covered by patient's insurance. 300 each 0   Lancets Misc. MISC 1 each by Does not apply route in the morning, at noon, and at bedtime. May substitute to any manufacturer covered by patient's insurance.  300 each 3   levothyroxine  (SYNTHROID ) 100 MCG tablet TAKE 1 TABLET BY MOUTH DAILY 365 tablet 0   metFORMIN  (GLUCOPHAGE -XR) 500 MG 24 hr tablet Take 1 tablet (500 mg total) by mouth daily with breakfast. 90 tablet 0   Multiple Vitamins-Minerals (PRESERVISION AREDS 2 PO) Take 1 capsule by mouth in the morning and at bedtime.     omega-3 acid ethyl esters (LOVAZA ) 1 g capsule TAKE 1 CAPSULE BY MOUTH TWICE A DAY 180 capsule 3   oxyCODONE  (ROXICODONE ) 5 MG immediate release tablet Take 1 tablet (5 mg total) by mouth every 6 (six) hours as needed for severe pain (pain score 7-10) or breakthrough pain. Crush and take with yogurt if painful to swallow pills 25 tablet 0   zonisamide (ZONEGRAN) 50 MG capsule Take 150 mg by mouth daily with supper.     No current facility-administered medications for this visit.     Musculoskeletal: Strength & Muscle Tone: within normal limits Gait & Station: normal, unsteady Patient leans: N/A  Psychiatric Specialty Exam: Review of Systems  There were no vitals taken for this visit.There is no height or weight on file to calculate BMI.  General Appearance: Well Groomed  Eye Contact:  Good  Speech:  Clear and Coherent and Normal Rate  Volume:  Normal  Mood:  Euthymic and dysthymic  Affect:  Congruent  Thought Process:  Coherent and Goal Directed  Orientation:  Full (Time, Place, and Person)  Thought Content: Logical   Suicidal Thoughts:  No  Homicidal Thoughts:  No  Memory:  NA  Judgement:  Fair  Insight:  Fair  Psychomotor Activity:  Normal  Concentration:  Concentration: Good  Recall:  Poor  Fund of Knowledge: Fair  Language: Good  Akathisia:  NA    AIMS (if indicated): not done  Assets:  Communication Skills Desire for Improvement Housing Intimacy Leisure Time Resilience Social Support  ADL's:  Intact  Cognition: WNL  Sleep:  Good   Metabolic Disorder Labs: Lab Results  Component Value Date   HGBA1C 6.7 (H) 06/26/2024   MPG 146  06/26/2024   MPG 126 (H) 08/27/2013   Lab Results  Component Value Date   PROLACTIN 3.9 07/22/2009   Lab Results  Component Value Date   CHOL 153 03/03/2023   TRIG 161 (H) 03/03/2023   HDL 51 03/03/2023   CHOLHDL 3.0 03/03/2023   VLDL 83.9 05/25/2022   LDLCALC 75 03/03/2023   LDLCALC 133 (H) 05/25/2022   Lab Results  Component Value Date   TSH 1.150 03/03/2023   TSH 1.89 05/25/2022    Therapeutic Level Labs: No results found for: LITHIUM No results found for: VALPROATE No results found for: CBMZ   Screenings: GAD-7    Flowsheet Row Office Visit from 01/03/2023  in [facility] [place]  Total GAD-7 Score 7   Mini-Mental    Flowsheet Row Office Visit from 03/03/2023 in [facility] Office Visit from 02/03/2023 in [facility] [place] Office Visit from 10/27/2022 in [facility] [place]  Total Score (max 30 points ) 26 28 29    PHQ2-9    Flowsheet Row Office Visit from 03/15/2023 in [facility] [place] Clinical Support from 02/09/2023 in [facility] [place] Office Visit from 01/03/2023 in [facility] [place] Video Visit from 11/01/2022 in [facility] [place] Office Visit from 08/11/2022 [facility]  PHQ-2 Total Score 4 0 4 0 0  PHQ-9 Total Score 14 0 15 4 --   Flowsheet Row Admission (Discharged) from 06/29/2024 in [facility] Admission (Discharged) from 05/30/2024 in [facility] from 06/05/2023 in [facility] Department at [facility]  C-SSRS RISK CATEGORY No Risk No Risk No Risk    Collaboration of Care: Collaboration of Care: Medication Management AEB medication prescription, Primary Care Provider AEB chart review, and Other provider involved in patient's care AEB ENT and Rehab chart  review  Patient/Guardian was advised Release of Information must be obtained prior to any record release in order to collaborate their care with an outside provider. Patient/Guardian was advised if they have not already done so to contact the registration department to sign all necessary forms in order for us  to release information regarding their care.   Consent: Patient/Guardian gives verbal consent for treatment and assignment of benefits for services provided during this visit. Patient/Guardian expressed understanding and agreed to proceed.    Arvella CHRISTELLA Finder, MD 07/30/2024, 1:11 PM

## 2024-07-31 ENCOUNTER — Ambulatory Visit (HOSPITAL_COMMUNITY): Admitting: Psychiatry

## 2024-07-31 ENCOUNTER — Encounter (HOSPITAL_COMMUNITY): Payer: Self-pay

## 2024-08-03 ENCOUNTER — Encounter: Payer: Self-pay | Admitting: Adult Health

## 2024-08-03 ENCOUNTER — Ambulatory Visit (INDEPENDENT_AMBULATORY_CARE_PROVIDER_SITE_OTHER): Admitting: Adult Health

## 2024-08-03 VITALS — BP 126/76 | HR 94 | Ht 59.0 in | Wt 171.4 lb

## 2024-08-03 DIAGNOSIS — Z9682 Presence of neurostimulator: Secondary | ICD-10-CM

## 2024-08-03 DIAGNOSIS — G4733 Obstructive sleep apnea (adult) (pediatric): Secondary | ICD-10-CM

## 2024-08-03 NOTE — Patient Instructions (Addendum)
 Follow up in 4 weeks and As needed

## 2024-08-03 NOTE — Progress Notes (Signed)
 @Patient  ID: Jean Nash, female    DOB: [birth year], 83 y.o.   MRN: [id number]  Chief Complaint  Patient presents with   Sleep Apnea    Referring provider: Merna Huxley, NP  HPI: 83 yo female followed for OSA -CPAP intolerant s/p INSPIRE Implantation   TEST/EVENTS :  Home Sleep Test 03/06/2015- severe obstructive sleep apnea/hypopnea syndrome, AHI 44.9 per hour with desaturation to 74% and mean saturation only 89% on room air.   INSPIRE Implant 06/29/24   08/03/2024 Follow up: OSA -INSPIRE Activation  Discussed the use of AI scribe software for clinical note transcription with the patient, who gave verbal consent to proceed.  History of Present Illness Jean Nash is an 83 year old female with severe sleep apnea who presents for follow-up after Inspire device implantation. She has a history of severe sleep apnea and was unable to tolerate CPAP therapy. She underwent evaluation for the [facility] device and had it implanted by an ENT on June 29, 2024. Post-surgery, she experienced significant discomfort and difficulty moving her tongue, with food getting stuck on one side of her mouth, requiring manual assistance to move it. These symptoms have mostly resolved, and she can now move her tongue. However, she has noticed a change in her lip and smile, which began post-surgery. Tongue exam today in the office shows a pulling towards the left side and then gradual involuntary movement to the right  No jaw pain, teeth pain, or bruising or speech changes.        No Known Allergies  Immunization History  Administered Date(s) Administered   Fluad Quad(high Dose 65+) 07/13/2019, 10/01/2020, 07/31/2021, 08/12/2022   INFLUENZA, HIGH DOSE SEASONAL PF 08/03/2014, 08/08/2015, 08/23/2016, 07/14/2017, 07/20/2018, 08/12/2022, 10/04/2023   Influenza Split 07/27/2012   Influenza Whole 09/13/2007, 08/08/2008, 07/30/2009, 08/25/2010   Influenza,inj,Quad PF,6+ Mos 07/25/2013   Novel  Infuenza-h1n1-09 10/07/2008   PFIZER Comirnaty(Gray Top)Covid-19 Tri-Sucrose Vaccine 02/18/2021, 09/07/2021, 08/12/2022   PFIZER(Purple Top)SARS-COV-2 Vaccination 12/23/2019, 01/17/2020, 08/18/2020   Pfizer(Comirnaty)Fall Seasonal Vaccine 12 years and older 01/26/2023   Pneumococcal Conjugate-13 08/16/2013   Pneumococcal Polysaccharide-23 06/18/2008   RSV,unspecified 08/29/2022   Tdap 11/29/2012   Zoster Recombinant(Shingrix) 04/11/2018, 09/22/2018   Zoster, Live 08/05/2011    Past Medical History:  Diagnosis Date   Adenomatous colon polyp    Anxiety    PHOBIAS   Arthritis    Breast cancer (HCC) 08/08/2013   right LOQ   Cataract    Chronic insomnia    Cluster headaches    history of migraines / NONE FOR SEVERAL YRS   Depression    Diabetes mellitus without complication (HCC)    Diverticulosis    Fatty liver 2011   Fibromyalgia    GERD (gastroesophageal reflux disease)    H/O hiatal hernia    History of transfusion 08/30/2013   Hx of radiation therapy 10/29/13- 12/14/13   right chest wall 5040 cGy 28 sessions, right supraclavicular/axillary region 5040 cGy 28 sessions, right chest wall boost 1000 cGy 5 sessions   Hypothyroidism    Internal hemorrhoids    Irritable bowel syndrome    Kidney stone    Lymphedema    RT ARM - WEARS SLEEVE   Macular degeneration    hole/right eye   MDD (major depressive disorder)    Osteopenia    Osteopenia    Other abnormal glucose    Other and unspecified hyperlipidemia    Pain in joint, shoulder region    Pneumonia 8032,7990   Sleep apnea  does not use CPAP   Stress incontinence, female     Tobacco History: Social History   Tobacco Use  Smoking Status Former   Current packs/day: 0.00   Average packs/day: 0.1 packs/day for 2.0 years (0.2 ttl pk-yrs)   Types: Cigarettes   Start date: 11/16/1959   Quit date: 11/15/1960   Years since quitting: 63.7   Passive exposure: Past (over 58 years ago)  Smokeless Tobacco Never    Counseling given: Not Answered   Outpatient Medications Prior to Visit  Medication Sig Dispense Refill   acetaminophen  (TYLENOL ) 500 MG tablet Take 1 tablet (500 mg total) by mouth every 6 (six) hours. 30 tablet 0   acidophilus (RISAQUAD) CAPS capsule Take 1 capsule by mouth daily.     Blood Glucose Monitoring Suppl DEVI 1 each by Does not apply route in the morning, at noon, and at bedtime. May substitute to any manufacturer covered by patient's insurance. 1 each 0   chlorproMAZINE (THORAZINE) 25 MG tablet Take 12.5-25 mg by mouth 3 (three) times daily as needed (headache).     Cholecalciferol (VITAMIN D ) 50 MCG (2000 UT) tablet Take 2,000 Units by mouth daily.     DULoxetine  (CYMBALTA ) 30 MG capsule Take 1 capsule (30 mg total) by mouth every evening. 90 capsule 0   DULoxetine  (CYMBALTA ) 60 MG capsule Take 60 mg by mouth daily.     esomeprazole  (NEXIUM ) 40 MG capsule TAKE 1 CAPSULE BY MOUTH DAILY AT NOON (Patient taking differently: Take 40 mg by mouth in the morning.) 365 capsule 0   famotidine  (PEPCID ) 40 MG tablet TAKE 1 TABLET BY MOUTH AT BEDTIME 90 tablet 1   Glucose Blood (BLOOD GLUCOSE TEST STRIPS) STRP 1 each by In Vitro route in the morning, at noon, and at bedtime. May substitute to any manufacturer covered by patient's insurance. 300 each 0   Lancets Misc. MISC 1 each by Does not apply route in the morning, at noon, and at bedtime. May substitute to any manufacturer covered by patient's insurance. 300 each 3   levothyroxine  (SYNTHROID ) 100 MCG tablet TAKE 1 TABLET BY MOUTH DAILY 365 tablet 0   metFORMIN  (GLUCOPHAGE -XR) 500 MG 24 hr tablet Take 1 tablet (500 mg total) by mouth daily with breakfast. 90 tablet 0   Multiple Vitamins-Minerals (PRESERVISION AREDS 2 PO) Take 1 capsule by mouth in the morning and at bedtime.     omega-3 acid ethyl esters (LOVAZA ) 1 g capsule TAKE 1 CAPSULE BY MOUTH TWICE A DAY 180 capsule 3   oxyCODONE  (ROXICODONE ) 5 MG immediate release tablet Take 1  tablet (5 mg total) by mouth every 6 (six) hours as needed for severe pain (pain score 7-10) or breakthrough pain. Crush and take with yogurt if painful to swallow pills 25 tablet 0   zonisamide (ZONEGRAN) 50 MG capsule Take 150 mg by mouth daily with supper.     No facility-administered medications prior to visit.     Review of Systems:   Constitutional:   No  weight loss, night sweats,  Fevers, chills,+ fatigue, or  lassitude.  HEENT:   No headaches,  Difficulty swallowing,  Tooth/dental problems, or  Sore throat,                No sneezing, itching, ear ache, nasal congestion, post nasal drip,   CV:  No chest pain,  Orthopnea, PND, swelling in lower extremities, anasarca, dizziness, palpitations, syncope.   GI  No heartburn, indigestion, abdominal pain, nausea, vomiting, diarrhea, change  in bowel habits, loss of appetite, bloody stools.   Resp: No shortness of breath with exertion or at rest.  No excess mucus, no productive cough,  No non-productive cough,  No coughing up of blood.  No change in color of mucus.  No wheezing.  No chest wall deformity  Skin: no rash or lesions.  GU: no dysuria, change in color of urine, no urgency or frequency.  No flank pain, no hematuria   MS:  No joint pain or swelling.  No decreased range of motion.  No back pain.    Physical Exam  BP 126/76 (BP Location: Left Arm, Patient Position: Sitting, Cuff Size: Large)   Pulse 94   Ht 4' 11 (1.499 m)   Wt 171 lb 6.4 oz (77.7 kg)   SpO2 94% Comment: RA  BMI 34.62 kg/m   GEN: A/Ox3; pleasant , NAD, well nourished    HEENT:  [place]/AT,  NOSE-clear, THROAT-clear, no lesions, no postnasal drip or exudate noted.  Tongue movement with midline movement initially to the left with a involuntary gradual movement back to the right.  NECK:  Supple w/ fair ROM; no JVD; normal carotid impulses w/o bruits; no thyromegaly or nodules palpated; no lymphadenopathy.    RESP  Clear  P & A; w/o, wheezes/ rales/ or  rhonchi. no accessory muscle use, no dullness to percussion  CARD:  RRR, no m/r/g, no peripheral edema, pulses intact, no cyanosis or clubbing.  GI:   Soft & nt; nml bowel sounds; no organomegaly or masses detected.   Musco: Warm bil, no deformities or joint swelling noted.   Neuro: alert, no focal deficits noted.    Skin: Warm, no lesions or rashes    Lab Results:  CBC   BMET  No results found for: BNP  ProBNP No results found for: PROBNP  Imaging: No results found.  Administration History     None           No data to display          No results found for: NITRICOXIDE      Assessment & Plan:   No problem-specific Assessment & Plan notes found for this encounter.  Assessment and Plan Assessment & Plan Severe obstructive sleep apnea status post Inspire device implantation   Severe obstructive sleep apnea with recent Inspire device implantation on June 29, 2024. The device has not been activated due to post-surgical complications, including significant discomfort and difficulty with tongue movement, which have improved but not fully resolved. There is concern about possible inflammation  potentially affecting device activation. She declined CPAP as an interim measure. Delay activation of the Inspire device for four weeks to allow further healing. Re-evaluate in four weeks to assess readiness for device activation. Discuss potential use of CPAP as an interim measure, but she declined.  Post-surgical hypoglossal nerve weakness and tongue deviation   Post-surgical hypoglossal nerve weakness and tongue deviation following Inspire device implantation. She experienced difficulty with tongue movement and food impaction, which have improved but not fully resolved. The tongue deviates to the initially to the left and drifts to the right, Monitor tongue movement and deviation over the next four weeks. Re-evaluate in four weeks to assess improvement in tongue  function.        Madelin Stank, NP 08/03/2024

## 2024-08-19 ENCOUNTER — Encounter (INDEPENDENT_AMBULATORY_CARE_PROVIDER_SITE_OTHER): Payer: Self-pay | Admitting: Otolaryngology

## 2024-08-30 ENCOUNTER — Encounter: Payer: Self-pay | Admitting: Pulmonary Disease

## 2024-08-30 ENCOUNTER — Ambulatory Visit (INDEPENDENT_AMBULATORY_CARE_PROVIDER_SITE_OTHER): Admitting: Pulmonary Disease

## 2024-08-30 VITALS — BP 93/66 | HR 91 | Ht <= 58 in | Wt 168.0 lb

## 2024-08-30 DIAGNOSIS — E66812 Obesity, class 2: Secondary | ICD-10-CM | POA: Diagnosis not present

## 2024-08-30 DIAGNOSIS — Z9682 Presence of neurostimulator: Secondary | ICD-10-CM | POA: Diagnosis not present

## 2024-08-30 DIAGNOSIS — Z87891 Personal history of nicotine dependence: Secondary | ICD-10-CM

## 2024-08-30 DIAGNOSIS — G4733 Obstructive sleep apnea (adult) (pediatric): Secondary | ICD-10-CM

## 2024-08-30 NOTE — Progress Notes (Signed)
 Jean Nash    [id number]    [birth year]  Primary Care Physician:Nafziger, Darleene, NP  Referring Physician: Merna Darleene, NP [address] [place],  [place] [address]  Chief complaint:   Patient being seen for inspire device activation  HPI:  Was last seen in the office on 08/03/2024 by Madelin Stank, had come in for device activation but was having some tongue soreness  Known to have severe obstructive sleep apnea with AHI of 44.9 with desaturations to 74%-study from 03/06/2015  Intolerant of CPAP Inspire device implantation 06/29/2024  Following device implantation she experienced significant tongue discomfort, difficulty moving her tongue food getting stuck on the side of her mouth Symptoms have gradually improved  Tongue movement has significantly improved back to her usual  Denies any jaw pain  Device site still feels a little sore but does not feel tender to touch  Her tongue feels fine, swallowing is fine  Sleep habits are poor generally Tries to go to bed between 11 and 12 Sometimes take a full 5-hour nap during the day She is not very active She is cared for by her husband  Outpatient Encounter Medications as of 08/30/2024  Medication Sig   acetaminophen  (TYLENOL ) 500 MG tablet Take 1 tablet (500 mg total) by mouth every 6 (six) hours.   acidophilus (RISAQUAD) CAPS capsule Take 1 capsule by mouth daily.   Blood Glucose Monitoring Suppl DEVI 1 each by Does not apply route in the morning, at noon, and at bedtime. May substitute to any manufacturer covered by patient's insurance.   chlorproMAZINE (THORAZINE) 25 MG tablet Take 12.5-25 mg by mouth 3 (three) times daily as needed (headache).   Cholecalciferol (VITAMIN D ) 50 MCG (2000 UT) tablet Take 2,000 Units by mouth daily.   DULoxetine  (CYMBALTA ) 30 MG capsule Take 1 capsule (30 mg total) by mouth every evening.   DULoxetine  (CYMBALTA ) 60 MG capsule Take 60 mg by mouth daily.   esomeprazole   (NEXIUM ) 40 MG capsule TAKE 1 CAPSULE BY MOUTH DAILY AT NOON (Patient taking differently: Take 40 mg by mouth in the morning.)   famotidine  (PEPCID ) 40 MG tablet TAKE 1 TABLET BY MOUTH AT BEDTIME   Glucose Blood (BLOOD GLUCOSE TEST STRIPS) STRP 1 each by In Vitro route in the morning, at noon, and at bedtime. May substitute to any manufacturer covered by patient's insurance.   Lancets Misc. MISC 1 each by Does not apply route in the morning, at noon, and at bedtime. May substitute to any manufacturer covered by patient's insurance.   levothyroxine  (SYNTHROID ) 100 MCG tablet TAKE 1 TABLET BY MOUTH DAILY   metFORMIN  (GLUCOPHAGE -XR) 500 MG 24 hr tablet Take 1 tablet (500 mg total) by mouth daily with breakfast.   Multiple Vitamins-Minerals (PRESERVISION AREDS 2 PO) Take 1 capsule by mouth in the morning and at bedtime.   omega-3 acid ethyl esters (LOVAZA ) 1 g capsule TAKE 1 CAPSULE BY MOUTH TWICE A DAY   oxyCODONE  (ROXICODONE ) 5 MG immediate release tablet Take 1 tablet (5 mg total) by mouth every 6 (six) hours as needed for severe pain (pain score 7-10) or breakthrough pain. Crush and take with yogurt if painful to swallow pills   zonisamide (ZONEGRAN) 50 MG capsule Take 150 mg by mouth daily with supper.   No facility-administered encounter medications on file as of 08/30/2024.    Allergies as of 08/30/2024   (No Known Allergies)    Past Medical History:  Diagnosis Date  Adenomatous colon polyp    Anxiety    PHOBIAS   Arthritis    Breast cancer (HCC) 08/08/2013   right LOQ   Cataract    Chronic insomnia    Cluster headaches    history of migraines / NONE FOR SEVERAL YRS   Depression    Diabetes mellitus without complication (HCC)    Diverticulosis    Fatty liver 2011   Fibromyalgia    GERD (gastroesophageal reflux disease)    H/O hiatal hernia    History of transfusion 08/30/2013   Hx of radiation therapy 10/29/13- 12/14/13   right chest wall 5040 cGy 28 sessions, right  supraclavicular/axillary region 5040 cGy 28 sessions, right chest wall boost 1000 cGy 5 sessions   Hypothyroidism    Internal hemorrhoids    Irritable bowel syndrome    Kidney stone    Lymphedema    RT ARM - WEARS SLEEVE   Macular degeneration    hole/right eye   MDD (major depressive disorder)    Osteopenia    Osteopenia    Other abnormal glucose    Other and unspecified hyperlipidemia    Pain in joint, shoulder region    Pneumonia 8032,7990   Sleep apnea    does not use CPAP   Stress incontinence, female     Past Surgical History:  Procedure Laterality Date   ABDOMINAL HYSTERECTOMY     APPENDECTOMY     BILATERAL TOTAL MASTECTOMY WITH AXILLARY LYMPH NODE DISSECTION  08/30/2013   Dr ARON   BREAST CYST ASPIRATION     9 cysts   CATARACT EXTRACTION, BILATERAL  2005/2007   CHOLECYSTECTOMY     COLONOSCOPY     COLONOSCOPY WITH PROPOFOL  N/A 11/10/2023   Procedure: COLONOSCOPY WITH PROPOFOL ;  Surgeon: Wilhelmenia Aloha Raddle., MD;  Location: [facility];  Service: Gastroenterology;  Laterality: N/A;   CYSTOCELE REPAIR     DRUG INDUCED ENDOSCOPY N/A 05/30/2024   Procedure: DRUG INDUCED SLEEP ENDOSCOPY;  Surgeon: Okey Burns, MD;  Location: [facility];  Service: ENT;  Laterality: N/A;   ENDOSCOPIC MUCOSAL RESECTION N/A 11/10/2023   Procedure: ENDOSCOPIC MUCOSAL RESECTION;  Surgeon: Wilhelmenia Aloha Raddle., MD;  Location: [facility];  Service: Gastroenterology;  Laterality: N/A;   EVACUATION BREAST HEMATOMA Left 08/31/2013   Procedure: EVACUATION HEMATOMA BREAST;  Surgeon: Jina ARON, MD;  Location: [facility];  Service: General;  Laterality: Left;   EYE SURGERY     to repair macular hole   FOOT ARTHROPLASTY     lt    GANGLION CYST EXCISION     rt foot   HEMORRHOID SURGERY     03/1993   HEMOSTASIS CLIP PLACEMENT  11/10/2023   Procedure: HEMOSTASIS CLIP PLACEMENT;  Surgeon: Wilhelmenia Aloha Raddle., MD;  Location: [facility];  Service: Gastroenterology;;    IMPLANTATION OF HYPOGLOSSAL NERVE STIMULATOR N/A 06/29/2024   Procedure: INSERTION, HYPOGLOSSAL NERVE STIMULATOR;  Surgeon: Okey Burns, MD;  Location: [facility];  Service: ENT;  Laterality: N/A;   JOINT REPLACEMENT  03/15/11   left knee replacement   KNEE ARTHROSCOPY     /partial knee 2016/left knee 2012   MASS EXCISION  11/04/2011   Procedure: EXCISION MASS;  Surgeon: Lamar LULLA Leonor Raddle., MD;  Location: [facility];  Service: Orthopedics;  Laterality: Right;  excisional biopsy right ulna mass   MASTECTOMY W/ SENTINEL NODE BIOPSY Right 08/30/2013   Procedure: RIGHT  AXILLARY SENTINEL LYMPH NODE BIOPSY; Right Axillary Node Disection;  Surgeon: Jina ARON, MD;  Location: [facility];  Service: General;  Laterality: Right;  Right side nuc med 7:00    PARTIAL KNEE ARTHROPLASTY Right 11/03/2015   Procedure: RIGHT KNEE MEDIAL UNICOMPARTMENTAL ARTHROPLASTY ;  Surgeon: Dempsey Moan, MD;  Location: [facility];  Service: Orthopedics;  Laterality: Right;   POLYPECTOMY  11/10/2023   Procedure: POLYPECTOMY;  Surgeon: Mansouraty, Aloha Raddle., MD;  Location: [facility];  Service: Gastroenterology;;   RECTOCELE REPAIR     SIMPLE MASTECTOMY WITH AXILLARY SENTINEL NODE BIOPSY Left 08/30/2013   Procedure: Bilateral Breast Mastectomy ;  Surgeon: Jina Nephew, MD;  Location: [facility];  Service: General;  Laterality: Left;   skin tags removed     breast, panty line, neckline   SUBMUCOSAL LIFTING INJECTION  11/10/2023   Procedure: SUBMUCOSAL LIFTING INJECTION;  Surgeon: Wilhelmenia Aloha Raddle., MD;  Location: [facility];  Service: Gastroenterology;;   TOE SURGERY     preventative crossover toe surg/right foot   TOE SURGERY  2009   left foot/screw  in 2nd toe   TONSILLECTOMY     UPPER GASTROINTESTINAL ENDOSCOPY      Family History  Problem Relation Age of Onset   Stroke Mother        died age 79   Diabetes Mother    Breast cancer Mother 81   Breast cancer Sister 43   Brain cancer Maternal Uncle 8    Breast cancer Paternal Aunt 46   Breast cancer Paternal Aunt        dx in her 77s   Cancer Maternal Grandmother        intra-abdominal cancer   Diabetes Maternal Grandfather    Breast cancer Paternal Grandmother 71   Brain cancer Cousin 60       maternal cousin   Brain cancer Cousin 20       paternal cousin   Colon cancer Neg Hx    Colon polyps Neg Hx    Crohn's disease Neg Hx    Esophageal cancer Neg Hx    Rectal cancer Neg Hx    Stomach cancer Neg Hx    Ulcerative colitis Neg Hx    Inflammatory bowel disease Neg Hx    Liver disease Neg Hx    Pancreatic cancer Neg Hx     Social History   Socioeconomic History   Marital status: Married    Spouse name: Ubaldo   Number of children: 3   Years of education: Not on file   Highest education level: Some college, no degree  Occupational History   Occupation: retired Catering manager  Tobacco Use   Smoking status: Former    Current packs/day: 0.00    Average packs/day: 0.1 packs/day for 2.0 years (0.2 ttl pk-yrs)    Types: Cigarettes    Start date: 11/16/1959    Quit date: 11/15/1960    Years since quitting: 63.8    Passive exposure: Past (over 58 years ago)   Smokeless tobacco: Never  Vaping Use   Vaping status: Never Used  Substance and Sexual Activity   Alcohol use: Not Currently   Drug use: No   Sexual activity: Yes    Comment: menarche age 89, fist live birth 70, P 3, hysterectomy age 59, no HRT, BCP 2 yrs  Other Topics Concern   Not on file  Social History Narrative   Occupation:  Retired Catering manager    Married with 3 grown children      Never Smoked     Alcohol use-no  Social Drivers of Corporate investment banker Strain: Low Risk  (06/23/2024)   Overall Financial Resource Strain (CARDIA)    Difficulty of Paying Living Expenses: Not hard at all  Food Insecurity: No Food Insecurity (06/23/2024)   Hunger Vital Sign    Worried About Running Out of Food in the Last Year: Never true    Ran Out of Food in the Last  Year: Never true  Transportation Needs: No Transportation Needs (06/23/2024)   PRAPARE - Administrator, Civil Service (Medical): No    Lack of Transportation (Non-Medical): No  Physical Activity: Inactive (06/23/2024)   Exercise Vital Sign    Days of Exercise per Week: 0 days    Minutes of Exercise per Session: Not on file  Stress: Stress Concern Present (06/23/2024)   [employer] of Occupational Health - Occupational Stress Questionnaire    Feeling of Stress: To some extent  Social Connections: Socially Integrated (06/23/2024)   Social Connection and Isolation Panel    Frequency of Communication with Friends and Family: More than three times a week    Frequency of Social Gatherings with Friends and Family: More than three times a week    Attends Religious Services: 1 to 4 times per year    Active Member of [employer] or Organizations: Yes    Attends Banker Meetings: 1 to 4 times per year    Marital Status: Married  Catering manager Violence: Not At Risk (02/09/2023)   Humiliation, Afraid, Rape, and Kick questionnaire    Fear of Current or Ex-Partner: No    Emotionally Abused: No    Physically Abused: No    Sexually Abused: No    Review of Systems  Respiratory:  Positive for apnea.   Psychiatric/Behavioral:  Positive for sleep disturbance.     Vitals:   08/30/24 1459  BP: 93/66  Pulse: 91  SpO2: 94%     Physical Exam Constitutional:      Appearance: She is obese.  HENT:     Head: Normocephalic.     Mouth/Throat:     Mouth: Mucous membranes are moist.  Eyes:     General: No scleral icterus. Cardiovascular:     Rate and Rhythm: Normal rate and regular rhythm.     Heart sounds: No murmur heard.    No friction rub.  Pulmonary:     Effort: No respiratory distress.     Breath sounds: No stridor. No wheezing or rhonchi.  Musculoskeletal:     Cervical back: No rigidity or tenderness.  Neurological:     Mental Status: She is alert.  Psychiatric:         Mood and Affect: Mood normal.    Data Reviewed: Sleep study reviewed 03/22/2024, 03/06/2015  Device was interrogated in the office today  Inspire device was activated Sensing amplitude of 1.4 Functional amplitude of 1.5 Set at 1.5-2.5 Delay of 30 minutes Pause duration of 15 minutes Therapy duration of 9 hours  Assessment/Plan: Inspire device activation  Severe obstructive sleep apnea  Class II obesity  Deconditioning  Device was activated today  Device remote teaching was provided  Follow-up in about 4 weeks  Device settings-level 1 at 1.5 V, delay of 30 minutes, pause duration of 50 minutes, therapy duration of 9 hours Stepup therapy on a weekly basis  Call us  with significant concernsIn addition to device programming, we discussed her sleep quality, optimizing sleep habits, regular exercises, inspire device adherence and need for continuous follow-up  In addition to device programming, we discussed her sleep quality, optimizing sleep habits, regular exercises, inspire device adherence and need for continuous follow-up  Jennet Epley MD [facility] 08/30/2024, 3:35 PM  CC: Merna Huxley, NP

## 2024-08-30 NOTE — Patient Instructions (Signed)
 I will see in about 4 weeks  Gradually increase your levels every week as tolerated You may back down the level if not tolerated  Call us  with significant concerns  Make sure you are exercising regularly  Make sure you are trying to get continuous sleep

## 2024-09-03 NOTE — Progress Notes (Unsigned)
 BH MD/PA/NP OP Progress Note  09/04/2024 1:30 PM Jean Nash  MRN:  [id number]  Visit Diagnosis:    ICD-10-CM   1. MDD (major depressive disorder), recurrent, in full remission  F33.42 DULoxetine  (CYMBALTA ) 30 MG capsule    2. Fibromyalgia  M79.7       Assessment: Jean Nash is a 83 y.o. female with a history of MDD, anxiety, and fibromyalgia who presented to [facility] at [facility] for initial evaluation on 08/11/22.    At initial evaluation patient reported a history of MDD but denied any current symptoms other than increased fatigue.  She noted that the increased fatigue seemed to correlate with when the pregabalin  was increased for her fibromyalgia.  Patient denies any other psychiatric symptoms including psychosis, paranoia, delusions, SI/HI, thoughts of self-harm, or neurovegetative signs of depression.  Patient reported struggling with chronic pain symptoms as well as migraines with some benefit from the pregabalin .  She denied any benefit from the Topamax .  Treatment options were discussed and it was decided to taper off of Effexor  and start Cymbalta  for coverage of MDD and fibromyalgia symptoms.     Jean Nash presents for follow-up evaluation. Today, 09/04/24, has had improvement in mood symptoms.  She has had increased energy, sleep, appetite, and motivation.  This is likely multifactorial due to engaging in physical therapy, treatment of her sleep apnea, resolution of her migraines, and psychiatric medications.  She denies any adverse side effects on the duloxetine .  We will continue on her current regimen and follow-up in 6 months.  Plan: - Continue Cymbalta  30 mg at bedtime - CBC,CMP, TSH, and UA reviewed - Neuropsych testing referral for dementia, MOCA 25 - Complete sleep study - Follow up in 6 months   Chief Complaint:  Chief Complaint  Patient presents with   Follow-up   HPI: Jean Nash presents alongside her husband.  She reports having  improvement in mood over the past month.  It seems to be a combination of factors leading to this.  These include her migraines and headaches resolving for the past few weeks, in-home physical therapy which she is making progress in, and her inspire device being started a week ago.  Since then she has been sleeping through the night and has had more energy in the morning.  Patient has been getting up and moving around more in the house.  She has even done the dishes a few times in the past couple weeks.  Both her husband and the physical therapist coming into the home have noticed improvement. Patient's appetite has also improved.  Her husband is still dealing with pancreatic cancer and finished the chemo and radiation treatments.  They both took a lot out of him though did seem to shrink the tumor.  He is now going to [facility] to see if he qualifies for an mRNA trial.  He continues to take her medication and denies any adverse side effects.  Patient open to staying on the medication for another 6 months to monitor for any change in symptoms before considering discontinuation.  Past Psychiatric History: Patient has seen Dr. Vickey in the past Psychiatry admission: denies  Previous suicide attempt: denies Past trials of medication: citalopram , and Effexor .  Patient has also taken Topamax  for migraines and pregabalin  for small fiber neuropathy.  Past Medical History:  Past Medical History:  Diagnosis Date   Adenomatous colon polyp    Anxiety    PHOBIAS   Arthritis    Breast cancer (HCC)  08/08/2013   right LOQ   Cataract    Chronic insomnia    Cluster headaches    history of migraines / NONE FOR SEVERAL YRS   Depression    Diabetes mellitus without complication (HCC)    Diverticulosis    Fatty liver 2011   Fibromyalgia    GERD (gastroesophageal reflux disease)    H/O hiatal hernia    History of transfusion 08/30/2013   Hx of radiation therapy 10/29/13- 12/14/13   right chest wall 5040 cGy 28  sessions, right supraclavicular/axillary region 5040 cGy 28 sessions, right chest wall boost 1000 cGy 5 sessions   Hypothyroidism    Internal hemorrhoids    Irritable bowel syndrome    Kidney stone    Lymphedema    RT ARM - WEARS SLEEVE   Macular degeneration    hole/right eye   MDD (major depressive disorder)    Osteopenia    Osteopenia    Other abnormal glucose    Other and unspecified hyperlipidemia    Pain in joint, shoulder region    Pneumonia 8032,7990   Sleep apnea    does not use CPAP   Stress incontinence, female     Past Surgical History:  Procedure Laterality Date   ABDOMINAL HYSTERECTOMY     APPENDECTOMY     BILATERAL TOTAL MASTECTOMY WITH AXILLARY LYMPH NODE DISSECTION  08/30/2013   Dr ARON   BREAST CYST ASPIRATION     9 cysts   CATARACT EXTRACTION, BILATERAL  2005/2007   CHOLECYSTECTOMY     COLONOSCOPY     COLONOSCOPY WITH PROPOFOL  N/A 11/10/2023   Procedure: COLONOSCOPY WITH PROPOFOL ;  Surgeon: Wilhelmenia Aloha Raddle., MD;  Location: [facility];  Service: Gastroenterology;  Laterality: N/A;   CYSTOCELE REPAIR     DRUG INDUCED ENDOSCOPY N/A 05/30/2024   Procedure: DRUG INDUCED SLEEP ENDOSCOPY;  Surgeon: Okey Burns, MD;  Location: [facility];  Service: ENT;  Laterality: N/A;   ENDOSCOPIC MUCOSAL RESECTION N/A 11/10/2023   Procedure: ENDOSCOPIC MUCOSAL RESECTION;  Surgeon: Wilhelmenia Aloha Raddle., MD;  Location: [facility];  Service: Gastroenterology;  Laterality: N/A;   EVACUATION BREAST HEMATOMA Left 08/31/2013   Procedure: EVACUATION HEMATOMA BREAST;  Surgeon: Jina ARON, MD;  Location: [facility];  Service: General;  Laterality: Left;   EYE SURGERY     to repair macular hole   FOOT ARTHROPLASTY     lt    GANGLION CYST EXCISION     rt foot   HEMORRHOID SURGERY     03/1993   HEMOSTASIS CLIP PLACEMENT  11/10/2023   Procedure: HEMOSTASIS CLIP PLACEMENT;  Surgeon: Wilhelmenia Aloha Raddle., MD;  Location: [facility];  Service:  Gastroenterology;;   IMPLANTATION OF HYPOGLOSSAL NERVE STIMULATOR N/A 06/29/2024   Procedure: INSERTION, HYPOGLOSSAL NERVE STIMULATOR;  Surgeon: Okey Burns, MD;  [facility];  Service: ENT;  Laterality: N/A;   JOINT REPLACEMENT  03/15/11   left knee replacement   KNEE ARTHROSCOPY     /partial knee 2016/left knee 2012   MASS EXCISION  11/04/2011   Procedure: EXCISION MASS;  Surgeon: Lamar LULLA Leonor Raddle., MD;  Location: [facility];  Service: Orthopedics;  Laterality: Right;  excisional biopsy right ulna mass   MASTECTOMY W/ SENTINEL NODE BIOPSY Right 08/30/2013   Procedure: RIGHT  AXILLARY SENTINEL LYMPH NODE BIOPSY; Right Axillary Node Disection;  Surgeon: Jina ARON, MD;  [facility];  Service: General;  Laterality: Right;  Right side nuc med 7:00    PARTIAL KNEE  ARTHROPLASTY Right 11/03/2015   Procedure: RIGHT KNEE MEDIAL UNICOMPARTMENTAL ARTHROPLASTY ;  Surgeon: Dempsey Moan, MD;  Location: [facility];  Service: Orthopedics;  Laterality: Right;   POLYPECTOMY  11/10/2023   Procedure: POLYPECTOMY;  Surgeon: Mansouraty, Aloha Raddle., MD;  Location: [facility];  Service: Gastroenterology;;   RECTOCELE REPAIR     SIMPLE MASTECTOMY WITH AXILLARY SENTINEL NODE BIOPSY Left 08/30/2013   Procedure: Bilateral Breast Mastectomy ;  Surgeon: Jina Nephew, MD;  [facility];  Service: General;  Laterality: Left;   skin tags removed     breast, panty line, neckline   SUBMUCOSAL LIFTING INJECTION  11/10/2023   Procedure: SUBMUCOSAL LIFTING INJECTION;  Surgeon: Wilhelmenia Aloha Raddle., MD;  Location: [facility];  Service: Gastroenterology;;   TOE SURGERY     preventative crossover toe surg/right foot   TOE SURGERY  2009   left foot/screw  in 2nd toe   TONSILLECTOMY     UPPER GASTROINTESTINAL ENDOSCOPY      Family Psychiatric History: Denies  Family History:  Family History  Problem Relation Age of Onset   Stroke Mother        died age 72   Diabetes Mother     Breast cancer Mother 49   Breast cancer Sister 56   Brain cancer Maternal Uncle 8   Breast cancer Paternal Aunt 91   Breast cancer Paternal Aunt        dx in her 18s   Cancer Maternal Grandmother        intra-abdominal cancer   Diabetes Maternal Grandfather    Breast cancer Paternal Grandmother 19   Brain cancer Cousin 6       maternal cousin   Brain cancer Cousin 20       paternal cousin   Colon cancer Neg Hx    Colon polyps Neg Hx    Crohn's disease Neg Hx    Esophageal cancer Neg Hx    Rectal cancer Neg Hx    Stomach cancer Neg Hx    Ulcerative colitis Neg Hx    Inflammatory bowel disease Neg Hx    Liver disease Neg Hx    Pancreatic cancer Neg Hx     Social History:  Social History   Socioeconomic History   Marital status: Married    Spouse name: Ubaldo   Number of children: 3   Years of education: Not on file   Highest education level: Some college, no degree  Occupational History   Occupation: retired Catering manager  Tobacco Use   Smoking status: Former    Current packs/day: 0.00    Average packs/day: 0.1 packs/day for 2.0 years (0.2 ttl pk-yrs)    Types: Cigarettes    Start date: 11/16/1959    Quit date: 11/15/1960    Years since quitting: 63.8    Passive exposure: Past (over 58 years ago)   Smokeless tobacco: Never  Vaping Use   Vaping status: Never Used  Substance and Sexual Activity   Alcohol use: Not Currently   Drug use: No   Sexual activity: Yes    Comment: menarche age 51, fist live birth 76, P 3, hysterectomy age 89, no HRT, BCP 2 yrs  Other Topics Concern   Not on file  Social History Narrative   Occupation:  Retired Catering manager    Married with 3 grown children      Never Smoked     Alcohol use-no        Social Drivers of Corporate investment banker  Strain: Low Risk  (06/23/2024)   Overall Financial Resource Strain (CARDIA)    Difficulty of Paying Living Expenses: Not hard at all  Food Insecurity: No Food Insecurity (06/23/2024)   Hunger Vital  Sign    Worried About Running Out of Food in the Last Year: Never true    Ran Out of Food in the Last Year: Never true  Transportation Needs: No Transportation Needs (06/23/2024)   PRAPARE - Administrator, Civil Service (Medical): No    Lack of Transportation (Non-Medical): No  Physical Activity: Inactive (06/23/2024)   Exercise Vital Sign    Days of Exercise per Week: 0 days    Minutes of Exercise per Session: Not on file  Stress: Stress Concern Present (06/23/2024)   [employer] of Occupational Health - Occupational Stress Questionnaire    Feeling of Stress: To some extent  Social Connections: Socially Integrated (06/23/2024)   Social Connection and Isolation Panel    Frequency of Communication with Friends and Family: More than three times a week    Frequency of Social Gatherings with Friends and Family: More than three times a week    Attends Religious Services: 1 to 4 times per year    Active Member of [employer] or Organizations: Yes    Attends Banker Meetings: 1 to 4 times per year    Marital Status: Married    Allergies:  No Known Allergies   Current Medications: Current Outpatient Medications  Medication Sig Dispense Refill   acetaminophen  (TYLENOL ) 500 MG tablet Take 1 tablet (500 mg total) by mouth every 6 (six) hours. 30 tablet 0   acidophilus (RISAQUAD) CAPS capsule Take 1 capsule by mouth daily.     Blood Glucose Monitoring Suppl DEVI 1 each by Does not apply route in the morning, at noon, and at bedtime. May substitute to any manufacturer covered by patient's insurance. 1 each 0   chlorproMAZINE (THORAZINE) 25 MG tablet Take 12.5-25 mg by mouth 3 (three) times daily as needed (headache).     Cholecalciferol (VITAMIN D ) 50 MCG (2000 UT) tablet Take 2,000 Units by mouth daily.     DULoxetine  (CYMBALTA ) 30 MG capsule Take 1 capsule (30 mg total) by mouth every evening. 90 capsule 1   esomeprazole  (NEXIUM ) 40 MG capsule TAKE 1 CAPSULE BY MOUTH DAILY  AT NOON (Patient taking differently: Take 40 mg by mouth in the morning.) 365 capsule 0   famotidine  (PEPCID ) 40 MG tablet TAKE 1 TABLET BY MOUTH AT BEDTIME 90 tablet 1   Glucose Blood (BLOOD GLUCOSE TEST STRIPS) STRP 1 each by In Vitro route in the morning, at noon, and at bedtime. May substitute to any manufacturer covered by patient's insurance. 300 each 0   Lancets Misc. MISC 1 each by Does not apply route in the morning, at noon, and at bedtime. May substitute to any manufacturer covered by patient's insurance. 300 each 3   levothyroxine  (SYNTHROID ) 100 MCG tablet TAKE 1 TABLET BY MOUTH DAILY 365 tablet 0   metFORMIN  (GLUCOPHAGE -XR) 500 MG 24 hr tablet Take 1 tablet (500 mg total) by mouth daily with breakfast. 90 tablet 0   Multiple Vitamins-Minerals (PRESERVISION AREDS 2 PO) Take 1 capsule by mouth in the morning and at bedtime.     omega-3 acid ethyl esters (LOVAZA ) 1 g capsule TAKE 1 CAPSULE BY MOUTH TWICE A DAY 180 capsule 3   oxyCODONE  (ROXICODONE ) 5 MG immediate release tablet Take 1 tablet (5 mg total) by mouth every  6 (six) hours as needed for severe pain (pain score 7-10) or breakthrough pain. Crush and take with yogurt if painful to swallow pills 25 tablet 0   zonisamide (ZONEGRAN) 50 MG capsule Take 150 mg by mouth daily with supper.     No current facility-administered medications for this visit.     Musculoskeletal: Strength & Muscle Tone: within normal limits Gait & Station: normal, unsteady Patient leans: N/A  Psychiatric Specialty Exam: Review of Systems  Blood pressure 136/81, pulse 90, height 4' 10 (1.473 m), weight 168 lb (76.2 kg).Body mass index is 35.11 kg/m.  General Appearance: Well Groomed  Eye Contact:  Good  Speech:  Clear and Coherent and Normal Rate  Volume:  Normal  Mood:  Euthymic and dysthymic  Affect:  Congruent  Thought Process:  Coherent and Goal Directed  Orientation:  Full (Time, Place, and Person)  Thought Content: Logical   Suicidal  Thoughts:  No  Homicidal Thoughts:  No  Memory:  NA  Judgement:  Fair  Insight:  Fair  Psychomotor Activity:  Normal  Concentration:  Concentration: Good  Recall:  Poor  Fund of Knowledge: Fair  Language: Good  Akathisia:  NA    AIMS (if indicated): not done  Assets:  Communication Skills Desire for Improvement Housing Intimacy Leisure Time Resilience Social Support  ADL's:  Intact  Cognition: WNL  Sleep:  Good   Metabolic Disorder Labs: Lab Results  Component Value Date   HGBA1C 6.7 (H) 06/26/2024   MPG 146 06/26/2024   MPG 126 (H) 08/27/2013   Lab Results  Component Value Date   PROLACTIN 3.9 07/22/2009   Lab Results  Component Value Date   CHOL 153 03/03/2023   TRIG 161 (H) 03/03/2023   HDL 51 03/03/2023   CHOLHDL 3.0 03/03/2023   VLDL 83.9 05/25/2022   LDLCALC 75 03/03/2023   LDLCALC 133 (H) 05/25/2022   Lab Results  Component Value Date   TSH 1.150 03/03/2023   TSH 1.89 05/25/2022    Therapeutic Level Labs: No results found for: LITHIUM No results found for: VALPROATE No results found for: CBMZ   Screenings: GAD-7    Flowsheet Row Office Visit from 01/03/2023 in [facility] [place]  Total GAD-7 Score 7   Mini-Mental    Flowsheet Row Office Visit from 03/03/2023 in [facility] Office Visit from 02/03/2023 in [facility] [place] Office Visit from 10/27/2022 in [facility] [place]  Total Score (max 30 points ) 26 28 29    PHQ2-9    Flowsheet Row Office Visit from 03/15/2023 in [facility] [place] [facility] [place] Clinical Support from 02/09/2023 in [facility] [place] Office Visit from 01/03/2023 in [facility] [place] Video Visit from 11/01/2022 in [facility] [place] [facility] [place] Office Visit from 08/11/2022 in [facility]   PHQ-2 Total Score 4 0 4 0 0  PHQ-9 Total Score 14 0 15 4 --   Flowsheet Row Admission (Discharged) from 06/29/2024 in [facility] Admission (Discharged) from 05/30/2024 in [facility] ED from 06/05/2023 in [facility] Emergency Department at [facility]  C-SSRS RISK CATEGORY No Risk No Risk No Risk    Collaboration of Care: Collaboration of Care: Medication Management AEB medication prescription, Primary Care Provider AEB chart review, and Other provider involved in patient's care AEB GI, Pulmonology, surgery chart review  Patient/Guardian was advised Release of  Information must be obtained prior to any record release in order to collaborate their care with an outside provider. Patient/Guardian was advised if they have not already done so to contact the registration department to sign all necessary forms in order for us  to release information regarding their care.   Consent: Patient/Guardian gives verbal consent for treatment and assignment of benefits for services provided during this visit. Patient/Guardian expressed understanding and agreed to proceed.    Arvella CHRISTELLA Finder, MD 09/04/2024, 1:30 PM

## 2024-09-04 ENCOUNTER — Ambulatory Visit (HOSPITAL_BASED_OUTPATIENT_CLINIC_OR_DEPARTMENT_OTHER): Admitting: Psychiatry

## 2024-09-04 ENCOUNTER — Other Ambulatory Visit: Payer: Self-pay

## 2024-09-04 ENCOUNTER — Encounter (HOSPITAL_COMMUNITY): Payer: Self-pay | Admitting: Psychiatry

## 2024-09-04 VITALS — BP 136/81 | HR 90 | Ht <= 58 in | Wt 168.0 lb

## 2024-09-04 DIAGNOSIS — F3342 Major depressive disorder, recurrent, in full remission: Secondary | ICD-10-CM

## 2024-09-04 DIAGNOSIS — M797 Fibromyalgia: Secondary | ICD-10-CM

## 2024-09-04 MED ORDER — DULOXETINE HCL 30 MG PO CPEP: 30.0000 mg | ORAL_CAPSULE | Freq: Every evening | ORAL | 1 refills | Status: AC

## 2024-09-14 ENCOUNTER — Other Ambulatory Visit: Payer: Self-pay | Admitting: Adult Health

## 2024-09-19 ENCOUNTER — Other Ambulatory Visit: Payer: Self-pay | Admitting: Adult Health

## 2024-09-19 DIAGNOSIS — E119 Type 2 diabetes mellitus without complications: Secondary | ICD-10-CM

## 2024-09-20 ENCOUNTER — Encounter: Payer: Self-pay | Admitting: Adult Health

## 2024-09-20 ENCOUNTER — Ambulatory Visit: Admitting: Adult Health

## 2024-09-20 VITALS — BP 110/80 | HR 86 | Temp 97.7°F | Ht <= 58 in | Wt 168.0 lb

## 2024-09-20 DIAGNOSIS — M25561 Pain in right knee: Secondary | ICD-10-CM

## 2024-09-20 DIAGNOSIS — G4733 Obstructive sleep apnea (adult) (pediatric): Secondary | ICD-10-CM

## 2024-09-20 DIAGNOSIS — M25562 Pain in left knee: Secondary | ICD-10-CM

## 2024-09-20 DIAGNOSIS — R5382 Chronic fatigue, unspecified: Secondary | ICD-10-CM | POA: Diagnosis not present

## 2024-09-20 DIAGNOSIS — G8929 Other chronic pain: Secondary | ICD-10-CM

## 2024-09-20 NOTE — Progress Notes (Signed)
 Subjective:    [name], female    DOB: [birth year], 83 y.o.   MRN: [id number]  HPI Discussed the use of AI scribe software for clinical note transcription with the patient, who gave verbal consent to proceed.  History of Present Illness   Jean Nash is an 83 year old female with severe sleep apnea and knee pain who presents with fatigue and decreased appetite.  She experiences significant fatigue  sleeping excessively both day and night.  Her caregiver observes that she sleeps in a chair throughout the day. Despite the recent activation of her Inspire device for sleep apnea, her fatigue has returned. She has not had an adjustment of the device. She does report that she has some initial improvement in fatigue and mood after the device was first activated but that was short lived.    She is thankful that since her device was activated that she is having less frequent migraine headaches   She has significant knee pain, especially in the left knee, which limits her mobility. The pain is severe during ambulation. Physical therapy provided initial relief, but she has not been consistent with exercises. She uses a cane to reduce knee pressure. She has a history of bilateral knee replacement.        Review of Systems See HPI   Past Medical History:  Diagnosis Date   Adenomatous colon polyp    Anxiety    PHOBIAS   Arthritis    Breast cancer (HCC) 08/08/2013   right LOQ   Cataract    Chronic insomnia    Cluster headaches    history of migraines / NONE FOR SEVERAL YRS   Depression    Diabetes mellitus without complication (HCC)    Diverticulosis    Fatty liver 2011   Fibromyalgia    GERD (gastroesophageal reflux disease)    H/O hiatal hernia    History of transfusion 08/30/2013   Hx of radiation therapy 10/29/13- 12/14/13   right chest wall 5040 cGy 28 sessions, right supraclavicular/axillary region 5040 cGy 28 sessions, right chest wall boost 1000 cGy 5  sessions   Hypothyroidism    Internal hemorrhoids    Irritable bowel syndrome    Kidney stone    Lymphedema    RT ARM - WEARS SLEEVE   Macular degeneration    hole/right eye   MDD (major depressive disorder)    Osteopenia    Osteopenia    Other abnormal glucose    Other and unspecified hyperlipidemia    Pain in joint, shoulder region    Pneumonia 8032,7990   Sleep apnea    does not use CPAP   Stress incontinence, female     Social History   Socioeconomic History   Marital status: Married    Spouse name: Ubaldo   Number of children: 3   Years of education: Not on file   Highest education level: Some college, no degree  Occupational History   Occupation: retired catering manager  Tobacco Use   Smoking status: Former    Current packs/day: 0.00    Average packs/day: 0.1 packs/day for 2.0 years (0.2 ttl pk-yrs)    Types: Cigarettes    Start date: 11/16/1959    Quit date: 11/15/1960    Years since quitting: 63.8    Passive exposure: Past (over 58 years ago)   Smokeless tobacco: Never  Vaping Use   Vaping status: Never Used  Substance and Sexual Activity   Alcohol use: Not Currently  Drug use: No   Sexual activity: Yes    Comment: menarche age 52, fist live birth 44, P 3, hysterectomy age 38, no HRT, BCP 2 yrs  Other Topics Concern   Not on file  Social History Narrative   Occupation:  Retired catering manager    Married with 3 grown children      Never Smoked     Alcohol use-no        Social Drivers of Corporate Investment Banker Strain: Low Risk  (09/17/2024)   Overall Financial Resource Strain (CARDIA)    Difficulty of Paying Living Expenses: Not hard at all  Food Insecurity: No Food Insecurity (09/17/2024)   Hunger Vital Sign    Worried About Running Out of Food in the Last Year: Never true    Ran Out of Food in the Last Year: Never true  Transportation Needs: No Transportation Needs (09/17/2024)   PRAPARE - Administrator, Civil Service (Medical): No    Lack  of Transportation (Non-Medical): No  Physical Activity: Inactive (09/17/2024)   Exercise Vital Sign    Days of Exercise per Week: 0 days    Minutes of Exercise per Session: Not on file  Stress: Stress Concern Present (06/23/2024)   [employer] of Occupational Health - Occupational Stress Questionnaire    Feeling of Stress: To some extent  Social Connections: Unknown (09/17/2024)   Social Connection and Isolation Panel    Frequency of Communication with Friends and Family: Twice a week    Frequency of Social Gatherings with Friends and Family: Never    Attends Religious Services: Not on Insurance Claims Handler of Clubs or Organizations: No    Attends Banker Meetings: Not on file    Marital Status: Married  Intimate Partner Violence: Not At Risk (02/09/2023)   Humiliation, Afraid, Rape, and Kick questionnaire    Fear of Current or Ex-Partner: No    Emotionally Abused: No    Physically Abused: No    Sexually Abused: No    Past Surgical History:  Procedure Laterality Date   ABDOMINAL HYSTERECTOMY     APPENDECTOMY     BILATERAL TOTAL MASTECTOMY WITH AXILLARY LYMPH NODE DISSECTION  08/30/2013   Dr ARON   BREAST CYST ASPIRATION     9 cysts   CATARACT EXTRACTION, BILATERAL  2005/2007   CHOLECYSTECTOMY     COLONOSCOPY     COLONOSCOPY WITH PROPOFOL  N/A 11/10/2023   Procedure: COLONOSCOPY WITH PROPOFOL ;  Surgeon: Wilhelmenia Aloha Raddle., MD;  Location: [facility];  Service: Gastroenterology;  Laterality: N/A;   CYSTOCELE REPAIR     DRUG INDUCED ENDOSCOPY N/A 05/30/2024   Procedure: DRUG INDUCED SLEEP ENDOSCOPY;  Surgeon: Okey Burns, MD;  Location: [facility];  Service: ENT;  Laterality: N/A;   ENDOSCOPIC MUCOSAL RESECTION N/A 11/10/2023   Procedure: ENDOSCOPIC MUCOSAL RESECTION;  Surgeon: Wilhelmenia Aloha Raddle., MD;  Location: [facility];  Service: Gastroenterology;  Laterality: N/A;   EVACUATION BREAST HEMATOMA Left 08/31/2013   Procedure:  EVACUATION HEMATOMA BREAST;  Surgeon: Jina Aron, MD;  Location: [facility];  Service: General;  Laterality: Left;   EYE SURGERY     to repair macular hole   FOOT ARTHROPLASTY     lt    GANGLION CYST EXCISION     rt foot   HEMORRHOID SURGERY     03/1993   HEMOSTASIS CLIP PLACEMENT  11/10/2023   Procedure: HEMOSTASIS CLIP PLACEMENT;  Surgeon: Wilhelmenia Aloha Raddle., MD;  Location: [facility];  Service: Gastroenterology;;   IMPLANTATION OF HYPOGLOSSAL NERVE STIMULATOR N/A 06/29/2024   Procedure: INSERTION, HYPOGLOSSAL NERVE STIMULATOR;  Surgeon: Okey Burns, MD;  Location: [facility];  Service: ENT;  Laterality: N/A;   JOINT REPLACEMENT  03/15/11   left knee replacement   KNEE ARTHROSCOPY     /partial knee 2016/left knee 2012   MASS EXCISION  11/04/2011   Procedure: EXCISION MASS;  Surgeon: Lamar LULLA Leonor Mickey., MD;  Location: [facility];  Service: Orthopedics;  Laterality: Right;  excisional biopsy right ulna mass   MASTECTOMY W/ SENTINEL NODE BIOPSY Right 08/30/2013   Procedure: RIGHT  AXILLARY SENTINEL LYMPH NODE BIOPSY; Right Axillary Node Disection;  Surgeon: Jina Nephew, MD;  Location: [facility];  Service: General;  Laterality: Right;  Right side nuc med 7:00    PARTIAL KNEE ARTHROPLASTY Right 11/03/2015   Procedure: RIGHT KNEE MEDIAL UNICOMPARTMENTAL ARTHROPLASTY ;  Surgeon: Dempsey Moan, MD;  Location: [facility];  Service: Orthopedics;  Laterality: Right;   POLYPECTOMY  11/10/2023   Procedure: POLYPECTOMY;  Surgeon: Mansouraty, Aloha Mickey., MD;  Location: [facility];  Service: Gastroenterology;;   RECTOCELE REPAIR     SIMPLE MASTECTOMY WITH AXILLARY SENTINEL NODE BIOPSY Left 08/30/2013   Procedure: Bilateral Breast Mastectomy ;  Surgeon: Jina Nephew, MD;  Location: [facility];  Service: General;  Laterality: Left;   skin tags removed     breast, panty line, neckline   SUBMUCOSAL LIFTING INJECTION  11/10/2023   Procedure: SUBMUCOSAL LIFTING INJECTION;  Surgeon: Wilhelmenia Aloha Mickey., MD;  Location: [facility];  Service: Gastroenterology;;   TOE SURGERY     preventative crossover toe surg/right foot   TOE SURGERY  2009   left foot/screw  in 2nd toe   TONSILLECTOMY     UPPER GASTROINTESTINAL ENDOSCOPY      Family History  Problem Relation Age of Onset   Stroke Mother        died age 15   Diabetes Mother    Breast cancer Mother 21   Breast cancer Sister 59   Brain cancer Maternal Uncle 8   Breast cancer Paternal Aunt 24   Breast cancer Paternal Aunt        dx in her 96s   Cancer Maternal Grandmother        intra-abdominal cancer   Diabetes Maternal Grandfather    Breast cancer Paternal Grandmother 83   Brain cancer Cousin 58       maternal cousin   Brain cancer Cousin 20       paternal cousin   Colon cancer Neg Hx    Colon polyps Neg Hx    Crohn's disease Neg Hx    Esophageal cancer Neg Hx    Rectal cancer Neg Hx    Stomach cancer Neg Hx    Ulcerative colitis Neg Hx    Inflammatory bowel disease Neg Hx    Liver disease Neg Hx    Pancreatic cancer Neg Hx     No Known Allergies  Current Outpatient Medications on File Prior to Visit  Medication Sig Dispense Refill   acetaminophen  (TYLENOL ) 500 MG tablet Take 1 tablet (500 mg total) by mouth every 6 (six) hours. 30 tablet 0   acidophilus (RISAQUAD) CAPS capsule Take 1 capsule by mouth daily.     Blood Glucose Monitoring Suppl DEVI 1 each by Does not apply route in the morning, at noon, and at bedtime. May substitute to any manufacturer covered by patient's insurance. 1  each 0   chlorproMAZINE (THORAZINE) 25 MG tablet Take 12.5-25 mg by mouth 3 (three) times daily as needed (headache).     Cholecalciferol (VITAMIN D ) 50 MCG (2000 UT) tablet Take 2,000 Units by mouth daily.     DULoxetine  (CYMBALTA ) 30 MG capsule Take 1 capsule (30 mg total) by mouth every evening. 90 capsule 1   esomeprazole  (NEXIUM ) 40 MG capsule TAKE 1 CAPSULE BY MOUTH DAILY AT NOON (Patient taking differently: Take 40  mg by mouth in the morning.) 365 capsule 0   famotidine  (PEPCID ) 40 MG tablet TAKE 1 TABLET BY MOUTH AT BEDTIME 90 tablet 1   Glucose Blood (BLOOD GLUCOSE TEST STRIPS) STRP 1 each by In Vitro route in the morning, at noon, and at bedtime. May substitute to any manufacturer covered by patient's insurance. 300 each 0   Lancets Misc. MISC 1 each by Does not apply route in the morning, at noon, and at bedtime. May substitute to any manufacturer covered by patient's insurance. 300 each 3   levothyroxine  (SYNTHROID ) 100 MCG tablet TAKE 1 TABLET BY MOUTH DAILY 365 tablet 0   metFORMIN  (GLUCOPHAGE -XR) 500 MG 24 hr tablet TAKE 1 TABLET BY MOUTH DAILY WITH BREAKFAST 90 tablet 0   Multiple Vitamins-Minerals (PRESERVISION AREDS 2 PO) Take 1 capsule by mouth in the morning and at bedtime.     omega-3 acid ethyl esters (LOVAZA ) 1 g capsule TAKE 1 CAPSULE BY MOUTH TWICE A DAY 180 capsule 3   oxyCODONE  (ROXICODONE ) 5 MG immediate release tablet Take 1 tablet (5 mg total) by mouth every 6 (six) hours as needed for severe pain (pain score 7-10) or breakthrough pain. Crush and take with yogurt if painful to swallow pills 25 tablet 0   Rimegepant Sulfate (NURTEC) 75 MG TBDP DISSOLVE 1 TABLET BY MOUTH DAILY AS NEEDED FOR ACUTE MIGRAINE ATTACKS 8 tablet 3   zonisamide (ZONEGRAN) 50 MG capsule Take 150 mg by mouth daily with supper.     No current facility-administered medications on file prior to visit.    BP 110/80   Pulse 86   Temp 97.7 F (36.5 C) (Oral)   Ht 4' 10 (1.473 m)   Wt 168 lb (76.2 kg)   SpO2 98%   BMI 35.11 kg/m       Objective:   Physical Exam Vitals and nursing note reviewed.  Constitutional:      Appearance: Normal appearance. She is obese.  Cardiovascular:     Rate and Rhythm: Normal rate and regular rhythm.     Pulses: Normal pulses.     Heart sounds: Normal heart sounds.  Pulmonary:     Effort: Pulmonary effort is normal.     Breath sounds: Normal breath sounds.   Musculoskeletal:        General: Normal range of motion.  Skin:    General: Skin is warm and dry.  Neurological:     General: No focal deficit present.     Mental Status: She is alert and oriented to person, place, and time.  Psychiatric:        Mood and Affect: Mood and affect normal.        Speech: Speech normal.        Behavior: Behavior normal.        Thought Content: Thought content normal.        Cognition and Memory: Cognition is impaired. Memory is impaired.        Judgment: Judgment normal.  Assessment & Plan:  Assessment and Plan    Fatigue  Persistent symptoms despite Inspire device activation, likely related to obstructive sleep apnea - Work up has been done on numerous occasions without additional causative factor.  - Follow up with pulmonary for Inspire device adjustment. - Encouraged mobility as tolerated.  Bilateral knee pain after arthroplasty Severe pain post-arthroplasty limiting mobility and contributing to fatigue. Possible rheumatism due to artificial joints. - Referred to [facility] for second opinion on knee pain. - Encouraged continuation of physical therapy as tolerated.  Obstructive sleep apnea, status post Inspire device Persistent fatigue suggests possible need for Inspire device adjustment. - Follow up with pulmonary for Inspire device adjustment.       I personally spent a total of 40 minutes in the care of the patient today including preparing to see the patient, getting/reviewing separately obtained history, performing a medically appropriate exam/evaluation, counseling and educating, placing orders, documenting clinical information in the EHR, and listening .

## 2024-09-20 NOTE — Patient Instructions (Signed)
 I am going to refer you to orthopedics   Please contact Evalene Riff - neuropsychiatry [phone]

## 2024-09-26 ENCOUNTER — Ambulatory Visit (INDEPENDENT_AMBULATORY_CARE_PROVIDER_SITE_OTHER): Admitting: Gastroenterology

## 2024-09-26 ENCOUNTER — Other Ambulatory Visit (INDEPENDENT_AMBULATORY_CARE_PROVIDER_SITE_OTHER)

## 2024-09-26 ENCOUNTER — Encounter: Payer: Self-pay | Admitting: Gastroenterology

## 2024-09-26 VITALS — BP 90/60 | HR 96 | Ht 58.25 in | Wt 165.4 lb

## 2024-09-26 DIAGNOSIS — K219 Gastro-esophageal reflux disease without esophagitis: Secondary | ICD-10-CM

## 2024-09-26 DIAGNOSIS — R634 Abnormal weight loss: Secondary | ICD-10-CM

## 2024-09-26 DIAGNOSIS — R194 Change in bowel habit: Secondary | ICD-10-CM | POA: Diagnosis not present

## 2024-09-26 DIAGNOSIS — T50905A Adverse effect of unspecified drugs, medicaments and biological substances, initial encounter: Secondary | ICD-10-CM

## 2024-09-26 DIAGNOSIS — Z860101 Personal history of adenomatous and serrated colon polyps: Secondary | ICD-10-CM | POA: Diagnosis not present

## 2024-09-26 DIAGNOSIS — Z860100 Personal history of colon polyps, unspecified: Secondary | ICD-10-CM

## 2024-09-26 DIAGNOSIS — R1030 Lower abdominal pain, unspecified: Secondary | ICD-10-CM

## 2024-09-26 LAB — COMPREHENSIVE METABOLIC PANEL WITH GFR
ALT: 34 U/L (ref 0–35)
AST: 18 U/L (ref 0–37)
Albumin: 4.3 g/dL (ref 3.5–5.2)
Alkaline Phosphatase: 182 U/L — ABNORMAL HIGH (ref 39–117)
BUN: 15 mg/dL (ref 6–23)
CO2: 26 meq/L (ref 19–32)
Calcium: 9.9 mg/dL (ref 8.4–10.5)
Chloride: 100 meq/L (ref 96–112)
Creatinine, Ser: 0.78 mg/dL (ref 0.40–1.20)
GFR: 71.21 mL/min (ref >60.00)
Glucose, Bld: 125 mg/dL — ABNORMAL HIGH (ref 70–99)
Potassium: 4.2 meq/L (ref 3.5–5.1)
Sodium: 137 meq/L (ref 135–145)
Total Bilirubin: 0.4 mg/dL (ref 0.2–1.2)
Total Protein: 7.4 g/dL (ref 6.0–8.3)

## 2024-09-26 LAB — TSH: TSH: 1.9 u[IU]/mL (ref 0.35–5.50)

## 2024-09-26 LAB — SEDIMENTATION RATE: Sed Rate: 67 mm/h — ABNORMAL HIGH (ref 0–30)

## 2024-09-26 LAB — C-REACTIVE PROTEIN: CRP: 1.3 mg/dL (ref 0.5–20.0)

## 2024-09-26 MED ORDER — HYOSCYAMINE SULFATE 0.125 MG SL SUBL: 0.1250 mg | SUBLINGUAL_TABLET | Freq: Two times a day (BID) | SUBLINGUAL | 0 refills | Status: DC

## 2024-09-26 MED ORDER — NA SULFATE-K SULFATE-MG SULF 17.5-3.13-1.6 GM/177ML PO SOLN: 1.0000 | ORAL | 0 refills | Status: DC

## 2024-09-26 NOTE — Progress Notes (Unsigned)
 GASTROENTEROLOGY OUTPATIENT CLINIC VISIT   Primary Care Provider Merna Huxley, NP [address] [place] [address] [phone]  Referring Provider Dr. Shila and Dr. Aneita  Patient Profile: Jean Nash is a 83 y.o. female with a pmh significant for previous breast cancer, nephrolithiasis, diabetes, hypothyroidism, migraines, hiatal hernia, GERD, IBS, colon polyps (TA's with ascending colon polyp in situ), diverticulosis.  The patient presents to the [facility] for an evaluation and management of problem(s) noted below:  Problem List No diagnosis found.   History of Present Illness Please see prior GI notes for full details of HPI.  Interval History This is a patient whom I am meeting as a referral for recent finding of a ascending colon polyp adenoma with need for attempt at advanced endoscopic resection.  Earlier this year the patient had undergone cross-sectional imaging and found to have an abnormality within the region of the hepatic flexure.  This led to a colonoscopy which was performed by Dr. Shila on Dr. Albin behalf.  Colon polyps were found as well as a large ascending colon polyp.  This returned as an adenoma on pathology.  The patient has not had any other significant GI symptoms or changes in her bowel habits or blood in her stool.  She is scheduled for colonoscopy in the coming weeks.  GI Review of Systems Positive as above Negative for dysphagia, odynophagia, nausea, vomiting, pain, melena, hematochezia  Review of Systems General: Denies fevers/chills/weight loss unintentionally Cardiovascular: Denies chest pain Pulmonary: Denies shortness of breath Gastroenterological: See HPI Genitourinary: Denies darkened urine Hematological: Denies easy bruising/bleeding Dermatological: Denies jaundice Psychological: Mood is stable   Medications Current Outpatient Medications  Medication Sig Dispense Refill   acetaminophen   (TYLENOL ) 500 MG tablet Take 1 tablet (500 mg total) by mouth every 6 (six) hours. 30 tablet 0   acidophilus (RISAQUAD) CAPS capsule Take 1 capsule by mouth daily.     Blood Glucose Monitoring Suppl DEVI 1 each by Does not apply route in the morning, at noon, and at bedtime. May substitute to any manufacturer covered by patient's insurance. 1 each 0   chlorproMAZINE (THORAZINE) 25 MG tablet Take 12.5-25 mg by mouth 3 (three) times daily as needed (headache).     Cholecalciferol (VITAMIN D ) 50 MCG (2000 UT) tablet Take 2,000 Units by mouth daily.     DULoxetine  (CYMBALTA ) 30 MG capsule Take 1 capsule (30 mg total) by mouth every evening. 90 capsule 1   esomeprazole  (NEXIUM ) 40 MG capsule TAKE 1 CAPSULE BY MOUTH DAILY AT NOON (Patient taking differently: Take 40 mg by mouth in the morning.) 365 capsule 0   famotidine  (PEPCID ) 40 MG tablet TAKE 1 TABLET BY MOUTH AT BEDTIME 90 tablet 1   levothyroxine  (SYNTHROID ) 100 MCG tablet TAKE 1 TABLET BY MOUTH DAILY 365 tablet 0   metFORMIN  (GLUCOPHAGE -XR) 500 MG 24 hr tablet TAKE 1 TABLET BY MOUTH DAILY WITH BREAKFAST 90 tablet 0   Multiple Vitamins-Minerals (PRESERVISION AREDS 2 PO) Take 1 capsule by mouth in the morning and at bedtime.     omega-3 acid ethyl esters (LOVAZA ) 1 g capsule TAKE 1 CAPSULE BY MOUTH TWICE A DAY 180 capsule 3   oxyCODONE  (ROXICODONE ) 5 MG immediate release tablet Take 1 tablet (5 mg total) by mouth every 6 (six) hours as needed for severe pain (pain score 7-10) or breakthrough pain. Crush and take with yogurt if painful to swallow pills 25 tablet 0   Rimegepant Sulfate (NURTEC) 75 MG TBDP DISSOLVE  1 TABLET BY MOUTH DAILY AS NEEDED FOR ACUTE MIGRAINE ATTACKS 8 tablet 3   zonisamide (ZONEGRAN) 50 MG capsule Take 150 mg by mouth daily with supper.     No current facility-administered medications for this visit.    Allergies No Known Allergies   Histories Past Medical History:  Diagnosis Date   Adenomatous colon polyp    Anxiety     PHOBIAS   Arthritis    Breast cancer (HCC) 08/08/2013   right LOQ   Cataract    Chronic insomnia    Cluster headaches    history of migraines / NONE FOR SEVERAL YRS   Depression    Diabetes mellitus without complication (HCC)    Diverticulosis    Fatty liver 2011   Fibromyalgia    GERD (gastroesophageal reflux disease)    H/O hiatal hernia    History of transfusion 08/30/2013   Hx of radiation therapy 10/29/13- 12/14/13   right chest wall 5040 cGy 28 sessions, right supraclavicular/axillary region 5040 cGy 28 sessions, right chest wall boost 1000 cGy 5 sessions   Hypothyroidism    Internal hemorrhoids    Irritable bowel syndrome    Kidney stone    Lymphedema    RT ARM - WEARS SLEEVE   Macular degeneration    hole/right eye   MDD (major depressive disorder)    Osteopenia    Osteopenia    Other abnormal glucose    Other and unspecified hyperlipidemia    Pain in joint, shoulder region    Pneumonia 8032,7990   Sleep apnea    does not use CPAP   Stress incontinence, female    Past Surgical History:  Procedure Laterality Date   ABDOMINAL HYSTERECTOMY     APPENDECTOMY     BILATERAL TOTAL MASTECTOMY WITH AXILLARY LYMPH NODE DISSECTION  08/30/2013   Dr ARON   BREAST CYST ASPIRATION     9 cysts   CATARACT EXTRACTION, BILATERAL  2005/2007   CHOLECYSTECTOMY     COLONOSCOPY     COLONOSCOPY WITH PROPOFOL  N/A 11/10/2023   Procedure: COLONOSCOPY WITH PROPOFOL ;  Surgeon: Wilhelmenia Aloha Raddle., MD;  Location: [facility];  Service: Gastroenterology;  Laterality: N/A;   CYSTOCELE REPAIR     DRUG INDUCED ENDOSCOPY N/A 05/30/2024   Procedure: DRUG INDUCED SLEEP ENDOSCOPY;  Surgeon: Okey Burns, MD;  Location: [facility];  Service: ENT;  Laterality: N/A;   ENDOSCOPIC MUCOSAL RESECTION N/A 11/10/2023   Procedure: ENDOSCOPIC MUCOSAL RESECTION;  Surgeon: Wilhelmenia Aloha Raddle., MD;  Location: [facility];  Service: Gastroenterology;  Laterality: N/A;    EVACUATION BREAST HEMATOMA Left 08/31/2013   Procedure: EVACUATION HEMATOMA BREAST;  Surgeon: Jina Aron, MD;  Location: [facility];  Service: General;  Laterality: Left;   EYE SURGERY     to repair macular hole   FOOT ARTHROPLASTY     lt    GANGLION CYST EXCISION     rt foot   HEMORRHOID SURGERY     03/1993   HEMOSTASIS CLIP PLACEMENT  11/10/2023   Procedure: HEMOSTASIS CLIP PLACEMENT;  Surgeon: Wilhelmenia Aloha Raddle., MD;  Location: [facility];  Service: Gastroenterology;;   IMPLANTATION OF HYPOGLOSSAL NERVE STIMULATOR N/A 06/29/2024   Procedure: INSERTION, HYPOGLOSSAL NERVE STIMULATOR;  Surgeon: Okey Burns, MD;  Location: [facility];  Service: ENT;  Laterality: N/A;   JOINT REPLACEMENT  03/15/11   left knee replacement   KNEE ARTHROSCOPY     /partial knee 2016/left knee 2012   MASS EXCISION  11/04/2011  Procedure: EXCISION MASS;  Surgeon: Lamar LULLA Leonor Mickey., MD;  Location: [facility];  Service: Orthopedics;  Laterality: Right;  excisional biopsy right ulna mass   MASTECTOMY W/ SENTINEL NODE BIOPSY Right 08/30/2013   Procedure: RIGHT  AXILLARY SENTINEL LYMPH NODE BIOPSY; Right Axillary Node Disection;  Surgeon: Jina Nephew, MD;  Location: [facility];  Service: General;  Laterality: Right;  Right side nuc med 7:00    PARTIAL KNEE ARTHROPLASTY Right 11/03/2015   Procedure: RIGHT KNEE MEDIAL UNICOMPARTMENTAL ARTHROPLASTY ;  Surgeon: Dempsey Moan, MD;  Location: [facility];  Service: Orthopedics;  Laterality: Right;   POLYPECTOMY  11/10/2023   Procedure: POLYPECTOMY;  Surgeon: Mansouraty, Aloha Mickey., MD;  Location: [facility];  Service: Gastroenterology;;   RECTOCELE REPAIR     SIMPLE MASTECTOMY WITH AXILLARY SENTINEL NODE BIOPSY Left 08/30/2013   Procedure: Bilateral Breast Mastectomy ;  Surgeon: Jina Nephew, MD;  Location: [facility];  Service: General;  Laterality: Left;   skin tags removed     breast, panty line, neckline   SUBMUCOSAL LIFTING INJECTION  11/10/2023    Procedure: SUBMUCOSAL LIFTING INJECTION;  Surgeon: Wilhelmenia Aloha Mickey., MD;  Location: [facility];  Service: Gastroenterology;;   TOE SURGERY     preventative crossover toe surg/right foot   TOE SURGERY  2009   left foot/screw  in 2nd toe   TONSILLECTOMY     UPPER GASTROINTESTINAL ENDOSCOPY     Social History   Socioeconomic History   Marital status: Married    Spouse name: Ubaldo   Number of children: 3   Years of education: Not on file   Highest education level: Some college, no degree  Occupational History   Occupation: retired catering manager  Tobacco Use   Smoking status: Former    Current packs/day: 0.00    Average packs/day: 0.1 packs/day for 2.0 years (0.2 ttl pk-yrs)    Types: Cigarettes    Start date: 11/16/1959    Quit date: 11/15/1960    Years since quitting: 63.9    Passive exposure: Past (over 58 years ago)   Smokeless tobacco: Never  Vaping Use   Vaping status: Never Used  Substance and Sexual Activity   Alcohol use: Not Currently   Drug use: No   Sexual activity: Yes    Comment: menarche age 80, fist live birth 89, P 3, hysterectomy age 25, no HRT, BCP 2 yrs  Other Topics Concern   Not on file  Social History Narrative   Occupation:  Retired catering manager    Married with 3 grown children      Never Smoked     Alcohol use-no        Social Drivers of Corporate Investment Banker Strain: Low Risk  (09/17/2024)   Overall Financial Resource Strain (CARDIA)    Difficulty of Paying Living Expenses: Not hard at all  Food Insecurity: No Food Insecurity (09/17/2024)   Hunger Vital Sign    Worried About Running Out of Food in the Last Year: Never true    Ran Out of Food in the Last Year: Never true  Transportation Needs: No Transportation Needs (09/17/2024)   PRAPARE - Administrator, Civil Service (Medical): No    Lack of Transportation (Non-Medical): No  Physical Activity: Inactive (09/17/2024)   Exercise Vital Sign    Days of Exercise per Week: 0 days     Minutes of Exercise per Session: Not on file  Stress: Stress Concern Present (06/23/2024)   [employer]  of Occupational Health - Occupational Stress Questionnaire    Feeling of Stress: To some extent  Social Connections: Unknown (09/17/2024)   Social Connection and Isolation Panel    Frequency of Communication with Friends and Family: Twice a week    Frequency of Social Gatherings with Friends and Family: Never    Attends Religious Services: Not on Marketing Executive or Organizations: No    Attends Banker Meetings: Not on file    Marital Status: Married  Intimate Partner Violence: Not At Risk (02/09/2023)   Humiliation, Afraid, Rape, and Kick questionnaire    Fear of Current or Ex-Partner: No    Emotionally Abused: No    Physically Abused: No    Sexually Abused: No   Family History  Problem Relation Age of Onset   Stroke Mother        died age 50   Diabetes Mother    Breast cancer Mother 48   Breast cancer Sister 65   Brain cancer Maternal Uncle 8   Breast cancer Paternal Aunt 89   Breast cancer Paternal Aunt        dx in her 63s   Cancer Maternal Grandmother        intra-abdominal cancer   Diabetes Maternal Grandfather    Breast cancer Paternal Grandmother 29   Brain cancer Cousin 67       maternal cousin   Brain cancer Cousin 20       paternal cousin   Colon cancer Neg Hx    Colon polyps Neg Hx    Crohn's disease Neg Hx    Esophageal cancer Neg Hx    Rectal cancer Neg Hx    Stomach cancer Neg Hx    Ulcerative colitis Neg Hx    Inflammatory bowel disease Neg Hx    Liver disease Neg Hx    Pancreatic cancer Neg Hx    I have reviewed her medical, social, and family history in detail and updated the electronic medical record as necessary.    PHYSICAL EXAMINATION  BP 90/60 (BP Location: Left Arm, Patient Position: Sitting, Cuff Size: Large)   Pulse 96   Ht 4' 10.25 (1.48 m) Comment: height measured without shoes  Wt 165 lb 6 oz (75  kg)   BMI 34.27 kg/m  Wt Readings from Last 3 Encounters:  09/26/24 165 lb 6 oz (75 kg)  09/20/24 168 lb (76.2 kg)  08/30/24 168 lb (76.2 kg)  GEN: NAD, appears stated age, doesn't appear chronically ill PSYCH: Cooperative, without pressured speech EYE: Conjunctivae pink, sclerae anicteric ENT: MMM CV: Nontachycardic RESP: No audible wheezing GI: NABS, soft, NT/ND, without rebound MSK/EXT: No significant lower extremity edema SKIN: No jaundice NEURO:  Alert & Oriented x 3, no focal deficits   REVIEW OF DATA  I reviewed the following data at the time of this encounter:  GI Procedures and Studies  May 2024 colonoscopy - Two 9 to 12 mm polyps in the ascending colon, removed with a cold snare. Resected and retrieved. - One 25 mm polyp in the ascending colon. Biopsied. Tattooed. - Severe diverticulosis in the sigmoid colon, in the descending colon, in the transverse colon and in the ascending colon. - Non-bleeding external and internal hemorrhoids.  Pathology Diagnosis 1. Surgical [P], colon, ascending, hepatic flexure, polyp (2) FRAGMENTS OF TUBULAR ADENOMA. NEGATIVE FOR HIGH-GRADE DYSPLASIA OR MALIGNANCY. 2. Surgical [P], colon, ascending, polyp (1) TUBULAR ADENOMA. NEGATIVE FOR HIGH-GRADE DYSPLASIA OR MALIGNANCY.  Laboratory  Studies  Reviewed those in epic  Imaging Studies  July 2024 CT abdomen pelvis with contrast IMPRESSION: 1. Marked sigmoid colonic diverticulosis without evidence of acute diverticulitis. 2. No evidence of nephrolithiasis or hydronephrosis. 3. Aortic atherosclerosis. 4. 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 Grade 1 anterolisthesis of L3. Aortic Atherosclerosis (ICD10-I70.0).  March 2024 CT chest abdomen pelvis IMPRESSION: 1. No findings to suggest metastatic disease in the chest, abdomen or pelvis. 2. However, there is some focal mural thickening in the region of the hepatic flexure of the colon. The possibility of a  developing colonic neoplasm in this region warrants consideration, and further evaluation with nonemergent colonoscopy is suggested in the near future to better evaluate this finding. 3. Colonic diverticulosis without evidence of acute diverticulitis at this time. 4. Aortic atherosclerosis, in addition to left anterior descending coronary artery disease. Assessment for potential risk factor modification, dietary therapy or pharmacologic therapy may be warranted, if clinically indicated. 5. Additional postoperative changes and incidental findings, as above.   ASSESSMENT  Ms. Goral is a 83 y.o. female with a pmh significant for previous breast cancer, nephrolithiasis, diabetes, hypothyroidism, migraines, hiatal hernia, GERD, IBS, colon polyps (TA's with ascending colon polyp in situ), diverticulosis.  The patient is seen today for evaluation and management of:  No diagnosis found.  The patient is hemodynamically and clinically stable from a GI perspective.  Based upon the description and endoscopic pictures I do feel that it is reasonable to pursue an Advanced Polypectomy attempt of the polyp/lesion.  We discussed some of the techniques of advanced polypectomy which include Endoscopic Mucosal Resection, OVESCO Full-Thickness Resection, Endorotor Morcellation, and Tissue Ablation via Fulguration.  We also reviewed images of typical techniques as noted above.  The risks and benefits of endoscopic evaluation were discussed with the patient; these include but are not limited to the risk of perforation, infection, bleeding, missed lesions, lack of diagnosis, severe illness requiring hospitalization, as well as anesthesia and sedation related illnesses.  During attempts at advanced resection, the risks of bleeding and perforation/leak are increased as opposed to diagnostic and screening procedures, and that was discussed with the patient as well.   In addition, I explained that with the possible need  for piecemeal resection, subsequent short-interval endoscopic evaluation for follow up and potential retreatment of the lesion/area may be necessary.  I did offer, a referral to surgery in order for patient to have opportunity to discuss surgical management/intervention prior to finalizing decision for attempt at endoscopic removal, however, the patient deferred on this.  If, after attempt at removal of the polyp/lesion, it is found that the patient has a complication or that an invasive lesion or malignant lesion is found, or that the polyp/lesion continues to recur, the patient is aware and understands that surgery may still be indicated/required.  All patient questions were answered, to the best of my ability, and the patient agrees to the aforementioned plan of action with follow-up as indicated.   PLAN  Proceed with scheduled colonoscopy with EMR attempt in the coming weeks Follow-up to be dictated based on results of colonoscopy   No orders of the defined types were placed in this encounter.   New Prescriptions   No medications on file   Modified Medications   No medications on file    Planned Follow Up No follow-ups on file.   Total Time in Face-to-Face and in Coordination of Care for patient including independent/personal interpretation/review of prior testing, medical history, examination, medication adjustment,  communicating results with the patient directly, and documentation within the EHR is 30 minutes.   Aloha Finner, MD [facility] Advanced Endoscopy Office # [phone]

## 2024-09-26 NOTE — Patient Instructions (Addendum)
 Your provider has requested that you go to the basement level for lab work before leaving today. Press B on the elevator. The lab is located at the first door on the left as you exit the elevator.  We have sent the following medications to your pharmacy for you to pick up at your convenience: Suprep, Levsin   Stop your Famotidine .  Take your Nexium  everyday. Nexium  can be increased to twice daily. Let us  know and we can send new prescription if needed.  You have been scheduled for a CT scan of the abdomen and pelvis at [facility], 1st floor Radiology. You are scheduled on 10/03/24  at 3:30 pm . You should arrive 2 hours and 15 minutes prior to your appointment at 1:15 pm for registration and drink oral contrast.   Please follow the written instructions below on the day of your exam:   1) Do not eat anything after 11:30 am  (4 hours prior to your test)     You may take any medications as prescribed with a small amount of water, if necessary. If you take any of the following medications: METFORMIN , GLUCOPHAGE , GLUCOVANCE, AVANDAMET, RIOMET , FORTAMET , ACTOPLUS MET, JANUMET, GLUMETZA  or METAGLIP, you MAY be asked to HOLD this medication 48 hours AFTER the exam.   The purpose of you drinking the oral contrast is to aid in the visualization of your intestinal tract. The contrast solution may cause some diarrhea. Depending on your individual set of symptoms, you may also receive an intravenous injection of x-ray contrast/dye. Plan on being at [facility] for 45 minutes or longer, depending on the type of exam you are having performed.   If you have any questions regarding your exam or if you need to reschedule, you may call [facility] at [phone] between the hours of 8:00 am and 5:00 pm, Monday-Friday.   Due to recent changes in healthcare laws, you may see the results of your imaging and laboratory studies on MyChart before your provider has had a chance to review them.  We  understand that in some cases there may be results that are confusing or concerning to you. Not all laboratory results come back in the same time frame and the provider may be waiting for multiple results in order to interpret others.  Please give us  48 hours in order for your provider to thoroughly review all the results before contacting the office for clarification of your results.   _______________________________________________________  If your blood pressure at your visit was 140/90 or greater, please contact your primary care physician to follow up on this.  _______________________________________________________  If you are age 3 or older, your body mass index should be between 23-30. Your Body mass index is 34.27 kg/m. If this is out of the aforementioned range listed, please consider follow up with your Primary Care Provider.  If you are age 68 or younger, your body mass index should be between 19-25. Your Body mass index is 34.27 kg/m. If this is out of the aformentioned range listed, please consider follow up with your Primary Care Provider.   ________________________________________________________  The [facility] providers would like to encourage you to use MYCHART to communicate with providers for non-urgent requests or questions.  Due to long hold times on the telephone, sending your provider a message by [facility] may be a faster and more efficient way to get a response.  Please allow 48 business hours for a response.  Please remember that this is  for non-urgent requests.  _______________________________________________________  [facility] is using a team-based approach to care.  Your team is made up of your doctor and two to three APPS. Our APPS (Nurse Practitioners and Physician Assistants) work with your physician to ensure care continuity for you. They are fully qualified to address your health concerns and develop a treatment plan. They communicate directly with  your gastroenterologist to care for you. Seeing the Advanced Practice Practitioners on your physician's team can help you by facilitating care more promptly, often allowing for earlier appointments, access to diagnostic testing, procedures, and other specialty referrals.   Thank you for choosing me and [facility].  Dr. Wilhelmenia

## 2024-09-27 ENCOUNTER — Other Ambulatory Visit: Payer: Self-pay

## 2024-09-27 ENCOUNTER — Ambulatory Visit (INDEPENDENT_AMBULATORY_CARE_PROVIDER_SITE_OTHER): Admitting: Adult Health

## 2024-09-27 ENCOUNTER — Ambulatory Visit: Payer: Self-pay | Admitting: Gastroenterology

## 2024-09-27 ENCOUNTER — Encounter: Payer: Self-pay | Admitting: Adult Health

## 2024-09-27 VITALS — BP 100/70 | HR 84 | Temp 98.4°F | Ht 59.0 in | Wt 167.0 lb

## 2024-09-27 DIAGNOSIS — Z8669 Personal history of other diseases of the nervous system and sense organs: Secondary | ICD-10-CM

## 2024-09-27 DIAGNOSIS — K219 Gastro-esophageal reflux disease without esophagitis: Secondary | ICD-10-CM

## 2024-09-27 DIAGNOSIS — Z860100 Personal history of colon polyps, unspecified: Secondary | ICD-10-CM

## 2024-09-27 DIAGNOSIS — Z7984 Long term (current) use of oral hypoglycemic drugs: Secondary | ICD-10-CM

## 2024-09-27 DIAGNOSIS — E119 Type 2 diabetes mellitus without complications: Secondary | ICD-10-CM | POA: Diagnosis not present

## 2024-09-27 DIAGNOSIS — R5382 Chronic fatigue, unspecified: Secondary | ICD-10-CM | POA: Diagnosis not present

## 2024-09-27 DIAGNOSIS — R194 Change in bowel habit: Secondary | ICD-10-CM

## 2024-09-27 DIAGNOSIS — R634 Abnormal weight loss: Secondary | ICD-10-CM

## 2024-09-27 DIAGNOSIS — G4733 Obstructive sleep apnea (adult) (pediatric): Secondary | ICD-10-CM | POA: Diagnosis not present

## 2024-09-27 DIAGNOSIS — R7989 Other specified abnormal findings of blood chemistry: Secondary | ICD-10-CM

## 2024-09-27 LAB — CBC
HCT: 42.2 % (ref 36.0–46.0)
Hemoglobin: 14.1 g/dL (ref 12.0–15.0)
MCHC: 33.5 g/dL (ref 30.0–36.0)
MCV: 86.4 fl (ref 78.0–100.0)
Platelets: 372 K/uL (ref 150.0–400.0)
RBC: 4.88 Mil/uL (ref 3.87–5.11)
RDW: 14.4 % (ref 11.5–15.5)
WBC: 10.6 K/uL — ABNORMAL HIGH (ref 4.0–10.5)

## 2024-09-27 LAB — POCT GLYCOSYLATED HEMOGLOBIN (HGB A1C): Hemoglobin A1C: 6.9 % — AB (ref 4.0–5.6)

## 2024-09-27 NOTE — Progress Notes (Signed)
 Subjective:    [name], female    DOB: February 02, 1941, 83 y.o.   MRN: [id number]  HPI  She presents to the office today for follow up regarding DM   DM Type 2 - Currently managed with Metformin  500 mg XR daily. She takes her medication most mornings but will forget to take it occasionally. She is leading a sedentary lifestyle.   Lab Results  Component Value Date   HGBA1C 6.7 (H) 06/26/2024   HGBA1C 6.3 (A) 06/10/2023   HGBA1C 6.2 (H) 03/03/2023   She is sleeping better since the Inspire Device was activated but she is feeling chronically fatigued and sleeps most of the day and night. She has an appointment with pulmonary coming up to see if she needs her settings adjusted. Thankfully her headaches have all but resolved.    Review of Systems See HPI   Past Medical History:  Diagnosis Date   Adenomatous colon polyp    Anxiety    PHOBIAS   Arthritis    Breast cancer (HCC) 08/08/2013   right LOQ   Cataract    Chronic insomnia    Cluster headaches    history of migraines / NONE FOR SEVERAL YRS   Depression    Diabetes mellitus without complication (HCC)    Diverticulosis    Fatty liver 2011   Fibromyalgia    GERD (gastroesophageal reflux disease)    H/O hiatal hernia    History of transfusion 08/30/2013   Hx of radiation therapy 10/29/13- 12/14/13   right chest wall 5040 cGy 28 sessions, right supraclavicular/axillary region 5040 cGy 28 sessions, right chest wall boost 1000 cGy 5 sessions   Hypothyroidism    Internal hemorrhoids    Irritable bowel syndrome    Kidney stone    Lymphedema    RT ARM - WEARS SLEEVE   Macular degeneration    hole/right eye   MDD (major depressive disorder)    Osteopenia    Osteopenia    Other abnormal glucose    Other and unspecified hyperlipidemia    Pain in joint, shoulder region    Pneumonia 8032,7990   Sleep apnea    does not use CPAP   Stress incontinence, female     Social History   Socioeconomic History    Marital status: Married    Spouse name: Ubaldo   Number of children: 3   Years of education: Not on file   Highest education level: Some college, no degree  Occupational History   Occupation: retired catering manager  Tobacco Use   Smoking status: Former    Current packs/day: 0.00    Average packs/day: 0.1 packs/day for 2.0 years (0.2 ttl pk-yrs)    Types: Cigarettes    Start date: 11/16/1959    Quit date: 11/15/1960    Years since quitting: 63.9    Passive exposure: Past (over 58 years ago)   Smokeless tobacco: Never  Vaping Use   Vaping status: Never Used  Substance and Sexual Activity   Alcohol use: Not Currently   Drug use: No   Sexual activity: Yes    Comment: menarche age 77, fist live birth 25, P 3, hysterectomy age 83, no HRT, BCP 2 yrs  Other Topics Concern   Not on file  Social History Narrative   Occupation:  Retired catering manager    Married with 3 grown children      Never Smoked     Alcohol use-no        Social  Drivers of Health   Financial Resource Strain: Low Risk  (09/17/2024)   Overall Financial Resource Strain (CARDIA)    Difficulty of Paying Living Expenses: Not hard at all  Food Insecurity: No Food Insecurity (09/17/2024)   Hunger Vital Sign    Worried About Running Out of Food in the Last Year: Never true    Ran Out of Food in the Last Year: Never true  Transportation Needs: No Transportation Needs (09/17/2024)   PRAPARE - Administrator, Civil Service (Medical): No    Lack of Transportation (Non-Medical): No  Physical Activity: Inactive (09/17/2024)   Exercise Vital Sign    Days of Exercise per Week: 0 days    Minutes of Exercise per Session: Not on file  Stress: Stress Concern Present (06/23/2024)   [employer] of Occupational Health - Occupational Stress Questionnaire    Feeling of Stress: To some extent  Social Connections: Unknown (09/17/2024)   Social Connection and Isolation Panel    Frequency of Communication with Friends and Family:  Twice a week    Frequency of Social Gatherings with Friends and Family: Never    Attends Religious Services: Not on Insurance Claims Handler of Clubs or Organizations: No    Attends Banker Meetings: Not on file    Marital Status: Married  Intimate Partner Violence: Not At Risk (02/09/2023)   Humiliation, Afraid, Rape, and Kick questionnaire    Fear of Current or Ex-Partner: No    Emotionally Abused: No    Physically Abused: No    Sexually Abused: No    Past Surgical History:  Procedure Laterality Date   ABDOMINAL HYSTERECTOMY     APPENDECTOMY     BILATERAL TOTAL MASTECTOMY WITH AXILLARY LYMPH NODE DISSECTION  08/30/2013   Dr ARON   BREAST CYST ASPIRATION     9 cysts   CATARACT EXTRACTION, BILATERAL  2005/2007   CHOLECYSTECTOMY     COLONOSCOPY     COLONOSCOPY WITH PROPOFOL  N/A 11/10/2023   Procedure: COLONOSCOPY WITH PROPOFOL ;  Surgeon: Wilhelmenia Aloha Raddle., MD;  Location: [facility];  Service: Gastroenterology;  Laterality: N/A;   CYSTOCELE REPAIR     DRUG INDUCED ENDOSCOPY N/A 05/30/2024   Procedure: DRUG INDUCED SLEEP ENDOSCOPY;  Surgeon: Okey Burns, MD;  Location: [facility];  Service: ENT;  Laterality: N/A;   ENDOSCOPIC MUCOSAL RESECTION N/A 11/10/2023   Procedure: ENDOSCOPIC MUCOSAL RESECTION;  Surgeon: Wilhelmenia Aloha Raddle., MD;  Location: [facility];  Service: Gastroenterology;  Laterality: N/A;   EVACUATION BREAST HEMATOMA Left 08/31/2013   Procedure: EVACUATION HEMATOMA BREAST;  Surgeon: Jina Aron, MD;  Location: [facility];  Service: General;  Laterality: Left;   EYE SURGERY     to repair macular hole   FOOT ARTHROPLASTY     lt    GANGLION CYST EXCISION     rt foot   HEMORRHOID SURGERY     03/1993   HEMOSTASIS CLIP PLACEMENT  11/10/2023   Procedure: HEMOSTASIS CLIP PLACEMENT;  Surgeon: Wilhelmenia Aloha Raddle., MD;  Location: [facility];  Service: Gastroenterology;;   IMPLANTATION OF HYPOGLOSSAL NERVE STIMULATOR N/A  06/29/2024   Procedure: INSERTION, HYPOGLOSSAL NERVE STIMULATOR;  Surgeon: Okey Burns, MD;  Location: [facility];  Service: ENT;  Laterality: N/A;   JOINT REPLACEMENT  03/15/11   left knee replacement   KNEE ARTHROSCOPY     /partial knee 2016/left knee 2012   MASS EXCISION  11/04/2011   Procedure: EXCISION MASS;  Surgeon: Lamar GAILS  Leonor Raddle., MD;  Location: [facility];  Service: Orthopedics;  Laterality: Right;  excisional biopsy right ulna mass   MASTECTOMY W/ SENTINEL NODE BIOPSY Right 08/30/2013   Procedure: RIGHT  AXILLARY SENTINEL LYMPH NODE BIOPSY; Right Axillary Node Disection;  Surgeon: Jina Nephew, MD;  Location: [facility];  Service: General;  Laterality: Right;  Right side nuc med 7:00    PARTIAL KNEE ARTHROPLASTY Right 11/03/2015   Procedure: RIGHT KNEE MEDIAL UNICOMPARTMENTAL ARTHROPLASTY ;  Surgeon: Dempsey Moan, MD;  Location: [facility];  Service: Orthopedics;  Laterality: Right;   POLYPECTOMY  11/10/2023   Procedure: POLYPECTOMY;  Surgeon: Mansouraty, Aloha Raddle., MD;  Location: [facility];  Service: Gastroenterology;;   RECTOCELE REPAIR     SIMPLE MASTECTOMY WITH AXILLARY SENTINEL NODE BIOPSY Left 08/30/2013   Procedure: Bilateral Breast Mastectomy ;  Surgeon: Jina Nephew, MD;  Location: [facility];  Service: General;  Laterality: Left;   skin tags removed     breast, panty line, neckline   SUBMUCOSAL LIFTING INJECTION  11/10/2023   Procedure: SUBMUCOSAL LIFTING INJECTION;  Surgeon: Wilhelmenia Aloha Raddle., MD;  Location: [facility];  Service: Gastroenterology;;   TOE SURGERY     preventative crossover toe surg/right foot   TOE SURGERY  2009   left foot/screw  in 2nd toe   TONSILLECTOMY     UPPER GASTROINTESTINAL ENDOSCOPY      Family History  Problem Relation Age of Onset   Stroke Mother        died age 68   Diabetes Mother    Breast cancer Mother 27   Breast cancer Sister 75   Brain cancer Maternal Uncle 8   Breast cancer Paternal Aunt 63   Breast  cancer Paternal Aunt        dx in her 70s   Cancer Maternal Grandmother        intra-abdominal cancer   Diabetes Maternal Grandfather    Breast cancer Paternal Grandmother 7   Brain cancer Cousin 83       maternal cousin   Brain cancer Cousin 20       paternal cousin   Colon cancer Neg Hx    Colon polyps Neg Hx    Crohn's disease Neg Hx    Esophageal cancer Neg Hx    Rectal cancer Neg Hx    Stomach cancer Neg Hx    Ulcerative colitis Neg Hx    Inflammatory bowel disease Neg Hx    Liver disease Neg Hx    Pancreatic cancer Neg Hx     No Known Allergies  Current Outpatient Medications on File Prior to Visit  Medication Sig Dispense Refill   acetaminophen  (TYLENOL ) 500 MG tablet Take 1 tablet (500 mg total) by mouth every 6 (six) hours. 30 tablet 0   acidophilus (RISAQUAD) CAPS capsule Take 1 capsule by mouth daily.     Blood Glucose Monitoring Suppl DEVI 1 each by Does not apply route in the morning, at noon, and at bedtime. May substitute to any manufacturer covered by patient's insurance. 1 each 0   chlorproMAZINE (THORAZINE) 25 MG tablet Take 12.5-25 mg by mouth 3 (three) times daily as needed (headache).     Cholecalciferol (VITAMIN D ) 50 MCG (2000 UT) tablet Take 2,000 Units by mouth daily.     DULoxetine  (CYMBALTA ) 30 MG capsule Take 1 capsule (30 mg total) by mouth every evening. 90 capsule 1   esomeprazole  (NEXIUM ) 40 MG capsule TAKE 1 CAPSULE BY MOUTH DAILY AT NOON (  Patient taking differently: Take 40 mg by mouth in the morning.) 365 capsule 0   hyoscyamine (LEVSIN SL) 0.125 MG SL tablet Place 1 tablet (0.125 mg total) under the tongue 2 (two) times daily. 30 tablet 0   levothyroxine  (SYNTHROID ) 100 MCG tablet TAKE 1 TABLET BY MOUTH DAILY 365 tablet 0   metFORMIN  (GLUCOPHAGE -XR) 500 MG 24 hr tablet TAKE 1 TABLET BY MOUTH DAILY WITH BREAKFAST 90 tablet 0   Multiple Vitamins-Minerals (PRESERVISION AREDS 2 PO) Take 1 capsule by mouth in the morning and at bedtime.     Na  Sulfate-K Sulfate-Mg Sulfate concentrate (SUPREP BOWEL PREP KIT) 17.5-3.13-1.6 GM/177ML SOLN Take 1 kit (354 mLs total) by mouth as directed. For colonoscopy prep 354 mL 0   omega-3 acid ethyl esters (LOVAZA ) 1 g capsule TAKE 1 CAPSULE BY MOUTH TWICE A DAY 180 capsule 3   oxyCODONE  (ROXICODONE ) 5 MG immediate release tablet Take 1 tablet (5 mg total) by mouth every 6 (six) hours as needed for severe pain (pain score 7-10) or breakthrough pain. Crush and take with yogurt if painful to swallow pills 25 tablet 0   Rimegepant Sulfate (NURTEC) 75 MG TBDP DISSOLVE 1 TABLET BY MOUTH DAILY AS NEEDED FOR ACUTE MIGRAINE ATTACKS 8 tablet 3   zonisamide (ZONEGRAN) 50 MG capsule Take 150 mg by mouth daily with supper.     No current facility-administered medications on file prior to visit.    BP 100/70   Pulse 84   Temp 98.4 F (36.9 C) (Oral)   Ht 4' 11 (1.499 m)   Wt 167 lb (75.8 kg)   SpO2 95%   BMI 33.73 kg/m       Objective:   Physical Exam Vitals and nursing note reviewed.  Constitutional:      Appearance: Normal appearance.  Cardiovascular:     Rate and Rhythm: Normal rate and regular rhythm.     Pulses: Normal pulses.     Heart sounds: Normal heart sounds.  Pulmonary:     Effort: Pulmonary effort is normal.     Breath sounds: Normal breath sounds.  Musculoskeletal:        General: Normal range of motion.  Skin:    General: Skin is warm and dry.     Capillary Refill: Capillary refill takes less than 2 seconds.  Neurological:     General: No focal deficit present.     Mental Status: She is alert and oriented to person, place, and time.  Psychiatric:        Mood and Affect: Mood normal.        Behavior: Behavior normal.        Thought Content: Thought content normal.        Judgment: Judgment normal.        Assessment & Plan:   1. Diabetes mellitus treated with oral medication (HCC) (Primary)  - POC HgB A1c- 6.9 - increased.  - Will have her take 2 tabs of metformin   daily  - Follow up in 3 months   2. OSA (obstructive sleep apnea) - Follow up with pulmonary   3. Chronic fatigue - Secondary to sedentary lifestyle. Encouraged to get outside and walk with her husband   4. Hx of migraine headaches - Resolved after starting [facility] Device  Darleene Shape, NP

## 2024-09-27 NOTE — Addendum Note (Signed)
 Addended by: MERNA DARLEENE CROME on: 09/27/2024 12:46 PM   Modules accepted: Orders

## 2024-09-27 NOTE — Patient Instructions (Addendum)
 It was great seeing you today   Your A1c was 6.9 - this is up from 6.7. I am going to increase your Metformin  to 1000 mg daily.   Follow up in  3 months for repeat testing   Please contact Evalene Riff - neuropsychiatry [phone]

## 2024-09-28 ENCOUNTER — Encounter: Payer: Self-pay | Admitting: Gastroenterology

## 2024-09-28 DIAGNOSIS — R194 Change in bowel habit: Secondary | ICD-10-CM | POA: Insufficient documentation

## 2024-09-28 DIAGNOSIS — R634 Abnormal weight loss: Secondary | ICD-10-CM | POA: Insufficient documentation

## 2024-09-28 DIAGNOSIS — R1030 Lower abdominal pain, unspecified: Secondary | ICD-10-CM | POA: Insufficient documentation

## 2024-09-28 DIAGNOSIS — T50905A Adverse effect of unspecified drugs, medicaments and biological substances, initial encounter: Secondary | ICD-10-CM | POA: Insufficient documentation

## 2024-09-28 LAB — TISSUE TRANSGLUTAMINASE, IGA: (tTG) Ab, IgA: <1 U/mL

## 2024-09-28 LAB — IGA: Immunoglobulin A: 87 mg/dL (ref 70–320)

## 2024-10-03 ENCOUNTER — Encounter (HOSPITAL_COMMUNITY): Payer: Self-pay

## 2024-10-03 ENCOUNTER — Ambulatory Visit (HOSPITAL_COMMUNITY)

## 2024-10-04 ENCOUNTER — Encounter (HOSPITAL_COMMUNITY): Payer: Self-pay

## 2024-10-04 ENCOUNTER — Encounter: Payer: Self-pay | Admitting: Adult Health

## 2024-10-04 NOTE — Telephone Encounter (Signed)
 Please advise

## 2024-10-05 ENCOUNTER — Encounter: Payer: Self-pay | Admitting: Nurse Practitioner

## 2024-10-05 ENCOUNTER — Ambulatory Visit (INDEPENDENT_AMBULATORY_CARE_PROVIDER_SITE_OTHER): Admitting: Nurse Practitioner

## 2024-10-05 VITALS — BP 132/80 | HR 90 | Ht 59.0 in | Wt 163.0 lb

## 2024-10-05 DIAGNOSIS — Z4542 Encounter for adjustment and management of neuropacemaker (brain) (peripheral nerve) (spinal cord): Secondary | ICD-10-CM | POA: Diagnosis not present

## 2024-10-05 DIAGNOSIS — G4733 Obstructive sleep apnea (adult) (pediatric): Secondary | ICD-10-CM | POA: Diagnosis not present

## 2024-10-05 DIAGNOSIS — Z9682 Presence of neurostimulator: Secondary | ICD-10-CM | POA: Diagnosis not present

## 2024-10-05 NOTE — Progress Notes (Signed)
 @Patient  ID: Jean Nash, female    DOB: 10-24-1941, 83 y.o.   MRN: [id number]  Chief Complaint  Patient presents with   INSPIRE FOLLOW UP     Referring provider: Merna Huxley, NP  HPI: 83 year old female, former smoker followed for OSA on Inspire and pulmonary fibrosis. She is a patient of Dr. Saundra and last seen in office 08/30/2024 by Jean Nash. Past medical history significant for migraines, hypothyroid, GERD, IBS, fibromyalgia, DDD, prediabetes, depression, breast cancer, insomnia, HLD.   TEST/EVENTS:  03/06/2015 HST: AHI 44.9/h, spO2 low 74%  08/30/2024: OV with Jean Nash. Severe OSA. Intolerant of CPAP. Inspire device implantation 06/29/2024. Following device implantation, experienced significant tongue discomfort, difficulty moving tongue. Symptoms have improved; back to use. Sleep habits poorly general. Sometimes takes a 5 hour nap during the day. Not very active. Cared for by her husband. Device activated. 1.5-2.5 V. Set at level 1. Delay 30 minutes, duration 9 hours, pause 15.   10/05/2024: Today - follow up Patient presents today with her husband for follow up after Inspire activation. There was some confusion regarding stepping up therapy so she is still on level 1. She seems to be a little unsure on how the device works and what it is supposed to feel like. She woke up from a nap the other day and couldn't talk due to the [place] running. Her husband is the person who manages her remote, and turns her Inspire off and on. He didn't realize he should be increasing her levels. Despite no adjustments, she does seem to be sleeping better he thinks. Her snoring has reduced and is lighter than it used to be. No witnessed apneas anymore. No pain/discomfort after use. Appears the device is off in the mornings when she wakes up. Jean Nash is running for an average of 9 hr 42 min based on download between naps and night time sleep. He does tend to turn it on before they go to sleep at  night.     No Known Allergies  Immunization History  Administered Date(s) Administered   Fluad Quad(high Dose 65+) 07/13/2019, 10/01/2020, 07/31/2021, 08/12/2022   INFLUENZA, HIGH DOSE SEASONAL PF 08/03/2014, 08/08/2015, 08/23/2016, 07/14/2017, 07/20/2018, 08/12/2022, 10/04/2023, 10/02/2024   Influenza Split 07/27/2012   Influenza Whole 09/13/2007, 08/08/2008, 07/30/2009, 08/25/2010   Influenza,inj,Quad PF,6+ Mos 07/25/2013   Novel Infuenza-h1n1-09 10/07/2008   PFIZER Comirnaty(Gray Top)Covid-19 Tri-Sucrose Vaccine 02/18/2021, 09/07/2021, 08/12/2022   PFIZER(Purple Top)SARS-COV-2 Vaccination 12/23/2019, 01/17/2020, 08/18/2020   Pfizer(Comirnaty)Fall Seasonal Vaccine 12 years and older 01/26/2023   Pneumococcal Conjugate-13 08/16/2013   Pneumococcal Polysaccharide-23 06/18/2008   RSV,unspecified 08/29/2022   Tdap 11/29/2012   Zoster Recombinant(Shingrix) 04/11/2018, 09/22/2018   Zoster, Live 08/05/2011    Past Medical History:  Diagnosis Date   Adenomatous colon polyp    Anxiety    PHOBIAS   Arthritis    Breast cancer (HCC) 08/08/2013   right LOQ   Cataract    Chronic insomnia    Cluster headaches    history of migraines / NONE FOR SEVERAL YRS   Depression    Diabetes mellitus without complication (HCC)    Diverticulosis    Fatty liver 2011   Fibromyalgia    GERD (gastroesophageal reflux disease)    H/O hiatal hernia    History of transfusion 08/30/2013   Hx of radiation therapy 10/29/13- 12/14/13   right chest wall 5040 cGy 28 sessions, right supraclavicular/axillary region 5040 cGy 28 sessions, right chest wall boost 1000 cGy 5 sessions   Hypothyroidism  Internal hemorrhoids    Irritable bowel syndrome    Kidney stone    Lymphedema    RT ARM - WEARS SLEEVE   Macular degeneration    hole/right eye   MDD (major depressive disorder)    Osteopenia    Osteopenia    Other abnormal glucose    Other and unspecified hyperlipidemia    Pain in joint, shoulder region     Pneumonia 8032,7990   Sleep apnea    does not use CPAP   Stress incontinence, female     Tobacco History: Social History   Tobacco Use  Smoking Status Former   Current packs/day: 0.00   Average packs/day: 0.1 packs/day for 2.0 years (0.2 ttl pk-yrs)   Types: Cigarettes   Start date: 11/16/1959   Quit date: 11/15/1960   Years since quitting: 63.9   Passive exposure: Past (over 58 years ago)  Smokeless Tobacco Never   Counseling given: Not Answered   Outpatient Medications Prior to Visit  Medication Sig Dispense Refill   acetaminophen  (TYLENOL ) 500 MG tablet Take 1 tablet (500 mg total) by mouth every 6 (six) hours. 30 tablet 0   acidophilus (RISAQUAD) CAPS capsule Take 1 capsule by mouth daily.     Blood Glucose Monitoring Suppl DEVI 1 each by Does not apply route in the morning, at noon, and at bedtime. May substitute to any manufacturer covered by patient's insurance. 1 each 0   chlorproMAZINE (THORAZINE) 25 MG tablet Take 12.5-25 mg by mouth 3 (three) times daily as needed (headache).     Cholecalciferol (VITAMIN D ) 50 MCG (2000 UT) tablet Take 2,000 Units by mouth daily.     DULoxetine  (CYMBALTA ) 30 MG capsule Take 1 capsule (30 mg total) by mouth every evening. 90 capsule 1   esomeprazole  (NEXIUM ) 40 MG capsule TAKE 1 CAPSULE BY MOUTH DAILY AT NOON (Patient taking differently: Take 40 mg by mouth in the morning.) 365 capsule 0   hyoscyamine  (LEVSIN  SL) 0.125 MG SL tablet Place 1 tablet (0.125 mg total) under the tongue 2 (two) times daily. 30 tablet 0   levothyroxine  (SYNTHROID ) 100 MCG tablet TAKE 1 TABLET BY MOUTH DAILY 365 tablet 0   Multiple Vitamins-Minerals (PRESERVISION AREDS 2 PO) Take 1 capsule by mouth in the morning and at bedtime.     Na Sulfate-K Sulfate-Mg Sulfate concentrate (SUPREP BOWEL PREP KIT) 17.5-3.13-1.6 GM/177ML SOLN Take 1 kit (354 mLs total) by mouth as directed. For colonoscopy prep 354 mL 0   omega-3 acid ethyl esters (LOVAZA ) 1 g capsule TAKE 1  CAPSULE BY MOUTH TWICE A DAY 180 capsule 3   oxyCODONE  (ROXICODONE ) 5 MG immediate release tablet Take 1 tablet (5 mg total) by mouth every 6 (six) hours as needed for severe pain (pain score 7-10) or breakthrough pain. Crush and take with yogurt if painful to swallow pills 25 tablet 0   Rimegepant Sulfate (NURTEC) 75 MG TBDP DISSOLVE 1 TABLET BY MOUTH DAILY AS NEEDED FOR ACUTE MIGRAINE ATTACKS 8 tablet 3   zonisamide (ZONEGRAN) 50 MG capsule Take 150 mg by mouth daily with supper.     metFORMIN  (GLUCOPHAGE -XR) 500 MG 24 hr tablet TAKE 1 TABLET BY MOUTH DAILY WITH BREAKFAST (Patient not taking: Reported on 10/05/2024) 90 tablet 0   No facility-administered medications prior to visit.     Review of Systems: as above    Physical Exam:  BP 132/80   Pulse 90   Ht 4' 11 (1.499 m)   Wt 163 lb (73.9  kg)   SpO2 97% Comment: on RA  BMI 32.92 kg/m   GEN: Pleasant, interactive, well-kempt; obese; in no acute distress HEENT:  Normocephalic and atraumatic. PERRLA. Sclera white. Nasal turbinates pink, moist and patent bilaterally. No rhinorrhea present. Oropharynx pink and moist, without exudate or edema. No lesions, ulcerations, or postnasal drip. Mallampati III NECK:  Supple w/ fair ROM. No lymphadenopathy.   CV: RRR, no m/r/g PULMONARY:  Unlabored, regular breathing. Clear bilaterally A&P w/o wheezes/rales/rhonchi. No accessory muscle use.  GI: BS present and normoactive. Soft, non-tender to palpation.  MSK: No erythema, warmth or tenderness.  Neuro: A/Ox3. No focal deficits noted.   Skin: Warm, no lesions or rashe Psych: Normal affect and behavior. Judgement and thought content appropriate.     Lab Results:  CBC    Component Value Date/Time   WBC 10.6 (H) 09/26/2024 1650   RBC 4.88 09/26/2024 1650   HGB 14.1 09/26/2024 1650   HGB 14.1 09/01/2017 1127   HCT 42.2 09/26/2024 1650   HCT 42.1 09/01/2017 1127   PLT 372.0 09/26/2024 1650   PLT 152 09/01/2017 1127   MCV 86.4  09/26/2024 1650   MCV 90.3 09/01/2017 1127   MCH 29.7 06/26/2024 1100   MCHC 33.5 09/26/2024 1650   RDW 14.4 09/26/2024 1650   RDW 14.0 09/01/2017 1127   LYMPHSABS 1.7 04/13/2023 1017   LYMPHSABS 1.8 09/01/2017 1127   MONOABS 0.5 04/13/2023 1017   MONOABS 0.5 09/01/2017 1127   EOSABS 0.2 04/13/2023 1017   EOSABS 0.1 09/01/2017 1127   BASOSABS 0.0 04/13/2023 1017   BASOSABS 0.0 09/01/2017 1127    BMET    Component Value Date/Time   NA 137 09/26/2024 1650   NA 140 09/01/2017 1127   K 4.2 09/26/2024 1650   K 4.1 09/01/2017 1127   CL 100 09/26/2024 1650   CO2 26 09/26/2024 1650   CO2 24 09/01/2017 1127   GLUCOSE 125 (H) 09/26/2024 1650   GLUCOSE 132 09/01/2017 1127   BUN 15 09/26/2024 1650   BUN 15.8 09/01/2017 1127   CREATININE 0.78 09/26/2024 1650   CREATININE 0.85 09/12/2020 1349   CREATININE 0.8 09/01/2017 1127   CALCIUM  9.9 09/26/2024 1650   CALCIUM  8.7 09/01/2017 1127   GFRNONAA >60 06/26/2024 1100   GFRNONAA 66 09/12/2020 1349   GFRAA 77 09/12/2020 1349    BNP No results found for: BNP   Imaging:  No results found.  Administration History     None           No data to display          No results found for: NITRICOXIDE      Assessment & Plan:   OSA (obstructive sleep apnea) OSA, intolerant of CPAP. On Inspire. Confusion surrounding therapy. Lengthy discussion surrounding proper use and step up of therapy. Stepped up to level 2 and tolerated well. Comfortable protrusion. Provided with calendar for step up dates and levels. Re-educated on remote use. Adjusted duration to 10 hr and advised that it should still be running in the morning upon wakening. Close attention on follow up. She does seem to be receiving benefit from use. She does not drive. Healthy weight management encouraged.   Hypoglossal nerve stimulator was assessed today.  Goal of the follow-up visit was to ensure good attendance, good subjective benefit, good tongue motion and  good sense lead waveforms .incision sites appear good, tongue protrusion was examined.  Download was reviewed and usage appears to be up to 9 hr  42 min hours average per day.   She denies any discomfort.  Device is set to conventional configuration+/-/+ Programming :  1.  Stimulation level : functional level 1.5 V, 1.5 V lower limit, upper limit 2.5 V.  She was set at level 2 2.  Start delay 30 minutes, pause time 15 minutes, duration 10 hours 3.  Sensing waveform was analyzed for 3 minutes 4.  Sleep remote education was provided patient demonstrated competency with the remote and was aware of patient Instruction videos and sleep remote guide 5.  Patient was instructed to step up levels by 1 level (0.1 V ) every week  6.  Inspire titration sleep study will be scheduled 2 to 4 weeks after the prestudy visit to verify efficacy of device  Patient Instructions  Continue using your Inspire device nightly and with naps. I adjusted how long your Jean Nash will run for so you will need to turn it off in the morning when you wake up.  You will step up your level by 1 level every week. The next time you will increase your level is next Friday, 11/28 to level 3. Follow the calendar we gave you. If you have any pain/discomfort after increasing to a new level, step back down by one level, stay there for 1-2 weeks, then try to increase again by one level. If you still have pain/discomfort, go back down by one level and stay there.   Follow up 1/2 at 1130 with Katie Raynell Scott,NP. If symptoms do not improve or worsen, please contact office for sooner follow up    Advised if symptoms do not improve or worsen, to please contact office for sooner follow up or seek emergency care.   I spent 35 minutes of dedicated to the care of this patient on the date of this encounter to include pre-visit review of records, face-to-face time with the patient discussing conditions above, post visit ordering of testing, clinical  documentation with the electronic health record, making appropriate referrals as documented, and communicating necessary findings to members of the patients care team.  Comer LULLA Rouleau, NP 10/05/2024  Pt aware and understands NP's role.

## 2024-10-05 NOTE — Assessment & Plan Note (Signed)
 OSA, intolerant of CPAP. On Inspire. Confusion surrounding therapy. Lengthy discussion surrounding proper use and step up of therapy. Stepped up to level 2 and tolerated well. Comfortable protrusion. Provided with calendar for step up dates and levels. Re-educated on remote use. Adjusted duration to 10 hr and advised that it should still be running in the morning upon wakening. Close attention on follow up. She does seem to be receiving benefit from use. She does not drive. Healthy weight management encouraged.   Hypoglossal nerve stimulator was assessed today.  Goal of the follow-up visit was to ensure good attendance, good subjective benefit, good tongue motion and good sense lead waveforms .incision sites appear good, tongue protrusion was examined.  Download was reviewed and usage appears to be up to 9 hr 42 min hours average per day.   She denies any discomfort.  Device is set to conventional configuration+/-/+ Programming :  1.  Stimulation level : functional level 1.5 V, 1.5 V lower limit, upper limit 2.5 V.  She was set at level 2 2.  Start delay 30 minutes, pause time 15 minutes, duration 10 hours 3.  Sensing waveform was analyzed for 3 minutes 4.  Sleep remote education was provided patient demonstrated competency with the remote and was aware of patient Instruction videos and sleep remote guide 5.  Patient was instructed to step up levels by 1 level (0.1 V ) every week  6.  Inspire titration sleep study will be scheduled 2 to 4 weeks after the prestudy visit to verify efficacy of device  Patient Instructions  Continue using your Inspire device nightly and with naps. I adjusted how long your [name] will run for so you will need to turn it off in the morning when you wake up.  You will step up your level by 1 level every week. The next time you will increase your level is next Friday, 11/28 to level 3. Follow the calendar we gave you. If you have any pain/discomfort after increasing to a new  level, step back down by one level, stay there for 1-2 weeks, then try to increase again by one level. If you still have pain/discomfort, go back down by one level and stay there.   Follow up 1/2 at 1130 with Katie Marshun Duva,NP. If symptoms do not improve or worsen, please contact office for sooner follow up

## 2024-10-05 NOTE — Patient Instructions (Addendum)
 Continue using your Inspire device nightly and with naps. I adjusted how long your [name] will run for so you will need to turn it off in the morning when you wake up.  You will step up your level by 1 level every week. The next time you will increase your level is next Friday, 11/28 to level 3. Follow the calendar we gave you. If you have any pain/discomfort after increasing to a new level, step back down by one level, stay there for 1-2 weeks, then try to increase again by one level. If you still have pain/discomfort, go back down by one level and stay there.   Follow up 1/2 at 1130 with Katie Jeliyah Middlebrooks,NP. If symptoms do not improve or worsen, please contact office for sooner follow up

## 2024-10-10 ENCOUNTER — Ambulatory Visit (HOSPITAL_COMMUNITY)
Admission: RE | Admit: 2024-10-10 | Discharge: 2024-10-10 | Disposition: A | Discharge status: Home / Self Care | Source: Ambulatory Visit | Attending: Gastroenterology | Admitting: Gastroenterology

## 2024-10-10 DIAGNOSIS — R634 Abnormal weight loss: Secondary | ICD-10-CM

## 2024-10-10 DIAGNOSIS — Z860101 Personal history of adenomatous and serrated colon polyps: Secondary | ICD-10-CM

## 2024-10-10 DIAGNOSIS — R194 Change in bowel habit: Secondary | ICD-10-CM

## 2024-10-10 MED ORDER — IOHEXOL 300 MG/ML  SOLN
100.0000 mL | Freq: Once | INTRAMUSCULAR | Status: AC | PRN
  Administered 2024-10-10: 100 mL via INTRAVENOUS

## 2024-10-10 MED ORDER — SODIUM CHLORIDE (PF) 0.9 % IJ SOLN
INTRAMUSCULAR | Status: AC
  Filled 2024-10-10: qty 50

## 2024-10-10 MED ORDER — IOHEXOL 9 MG/ML PO SOLN: 1000.0000 mL | ORAL | Status: AC

## 2024-10-18 ENCOUNTER — Other Ambulatory Visit: Payer: Self-pay

## 2024-10-18 ENCOUNTER — Ambulatory Visit: Admitting: Orthopedic Surgery

## 2024-10-18 DIAGNOSIS — M25561 Pain in right knee: Secondary | ICD-10-CM

## 2024-10-19 ENCOUNTER — Encounter: Payer: Self-pay | Admitting: Orthopedic Surgery

## 2024-10-19 NOTE — Progress Notes (Signed)
 Office Visit Note   Patient: Jean Nash           Date of Birth: 1941/03/18           MRN: [id number] Visit Date: 10/18/2024 Requested by: Merna Huxley, NP [address] [place],  [place] [address] PCP: Merna Huxley, NP  Subjective: Chief Complaint  Patient presents with   Right Knee - Pain   Left Knee - Pain    HPI: Jean Nash is a 83 y.o. female who presents to the office reporting bilateral knee pain and would like a second opinion regarding her options.  Patient reports relatively constant pain in the anterior aspect of both knees.  Describes difficulty with ambulation.  Uses a cane and walker sometimes with ambulation.  Denies any fevers or chills.  Does report some occasional swelling in the left knee.  She has seen other providers in town for opinions about this problem.  The knees themselves are about 81 years old.  Denies any low back pain and denies any numbness or tingling.  Also denies any groin pain.  She does not take much in the way of over-the-counter medication because of a history of stomach ulcers.  She has less than 1 block of walking endurance.  Here with her husband today..                ROS: All systems reviewed are negative as they relate to the chief complaint within the history of present illness.  Patient denies fevers or chills.  Assessment & Plan: Visit Diagnoses:  1. Pain in both knees, unspecified chronicity     Plan: Impression is bilateral anterior knee pain.  I think on the right-hand side she may have developed some patellofemoral arthritis which is symptomatic.  Her clinical symptoms do not rise to the level of undergoing revision knee replacement at this time on the right-hand side.  The left-hand side is slightly more enigmatic.  She does have anterior knee pain but has very good stability and range of motion with the implants.  In my experience the type of implant used which is a J&J implant does have a propensity towards  patellofemoral crepitus and anterior knee pain.  Again this does not really rise to the level of revision knee surgery at this time for [facility].  On a broader scale the implants do not appear to be infected loose or unstable.  There is slight varus alignment in the right leg but no evidence of settling of the prosthesis.  Plan at this time is observation.  Also does not appear to be referred pain from the back or hip.  Follow-Up Instructions: No follow-ups on file.   Orders:  Orders Placed This Encounter  Procedures   XR Knee 1-2 Views Right   XR Knee 1-2 Views Left   No orders of the defined types were placed in this encounter.     Procedures: No procedures performed   Clinical Data: No additional findings.  Objective: Vital Signs: There were no vitals taken for this visit.  Physical Exam:  Constitutional: Patient appears well-developed HEENT:  Head: Normocephalic Eyes:EOM are normal Neck: Normal range of motion Cardiovascular: Normal rate Pulmonary/chest: Effort normal Neurologic: Patient is alert Skin: Skin is warm Psychiatric: Patient has normal mood and affect  Ortho Exam: Ortho exam demonstrates no effusion or warmth in either knee.  Mild patellofemoral crepitus is present on the right less so on the left.  Collaterals are stable to varus and valgus  stress at 0 and 30 degrees.  Both patellas track well without apprehension.  Pedal pulses palpable.  No nerve root tension signs.  No groin pain with internal or external rotation of the leg.  No masses lymphadenopathy or skin changes noted around either knee region.  Specialty Comments:  No specialty comments available.  Imaging: No results found.   PMFS History: Patient Active Problem List   Diagnosis Date Noted   Adverse drug interaction with prescription medication 09/28/2024   Lower abdominal pain 09/28/2024   Change in bowel habits 09/28/2024   Unintentional weight loss 09/28/2024   BMI 36.0-36.9,adult  06/29/2024   Intolerance of continuous positive airway pressure (CPAP) ventilation 05/30/2024   Migraine 11/29/2023   Adenoma of ascending colon 11/10/2023   Abnormal CT scan, gastrointestinal tract 07/08/2023   Abnormal colonoscopy 07/08/2023   Hx of adenomatous colonic polyps 07/08/2023   Adenomatous polyp of ascending colon 07/08/2023   DDD (degenerative disc disease), cervical 12/29/2021   Genetic testing 08/05/2020   Fibromyalgia    MDD (major depressive disorder), recurrent, in full remission 12/10/2019   Pulmonary fibrosis (HCC) 12/04/2018   MDD (major depressive disorder), recurrent episode, mild 12/22/2017   Osteoporosis 07/14/2017   OA (osteoarthritis) of knee 11/03/2015   Osteopenia 08/08/2015   OSA (obstructive sleep apnea) 06/25/2015   Headache 02/03/2015   Atypical chest pain 07/15/2014   Arthralgia 02/22/2014   Psoriasis 02/22/2014   Malignant neoplasm of lower-outer quadrant of right breast of female, estrogen receptor positive (HCC) 08/09/2013   Neck pain 06/22/2013   Left knee pain 11/29/2012   Reactive depression (situational) 04/26/2012   Right wrist pain 02/02/2012   Right foot pain 10/04/2011   Nevus 06/05/2011   Status post total knee replacement 04/06/2011   Overactive bladder 03/14/2011   KNEE PAIN, LEFT 08/31/2010   HIRSUTISM 06/16/2009   Idiopathic cyst of iris, ciliary body, or anterior chamber 05/20/2008   IRRITABLE BOWEL SYNDROME 04/15/2008   Allergic asthma, mild intermittent, uncomplicated 03/19/2008   GERD 03/19/2008   History of colonic polyps 03/19/2008   NEPHROLITHIASIS, HX OF 03/19/2008   Hypothyroidism 03/18/2008   Hyperlipidemia 03/18/2008   INSOMNIA, CHRONIC 03/18/2008   FIBROMYALGIA 03/18/2008   Prediabetes 03/18/2008   Past Medical History:  Diagnosis Date   Adenomatous colon polyp    Anxiety    PHOBIAS   Arthritis    Breast cancer (HCC) 08/08/2013   right LOQ   Cataract    Chronic insomnia    Cluster headaches     history of migraines / NONE FOR SEVERAL YRS   Depression    Diabetes mellitus without complication (HCC)    Diverticulosis    Fatty liver 2011   Fibromyalgia    GERD (gastroesophageal reflux disease)    H/O hiatal hernia    History of transfusion 08/30/2013   Hx of radiation therapy 10/29/13- 12/14/13   right chest wall 5040 cGy 28 sessions, right supraclavicular/axillary region 5040 cGy 28 sessions, right chest wall boost 1000 cGy 5 sessions   Hypothyroidism    Internal hemorrhoids    Irritable bowel syndrome    Kidney stone    Lymphedema    RT ARM - WEARS SLEEVE   Macular degeneration    hole/right eye   MDD (major depressive disorder)    Osteopenia    Osteopenia    Other abnormal glucose    Other and unspecified hyperlipidemia    Pain in joint, shoulder region    Pneumonia 8032,7990   Sleep apnea  does not use CPAP   Stress incontinence, female     Family History  Problem Relation Age of Onset   Stroke Mother        died age 19   Diabetes Mother    Breast cancer Mother 23   Breast cancer Sister 68   Brain cancer Maternal Uncle 8   Breast cancer Paternal Aunt 75   Breast cancer Paternal Aunt        dx in her 45s   Cancer Maternal Grandmother        intra-abdominal cancer   Diabetes Maternal Grandfather    Breast cancer Paternal Grandmother 22   Brain cancer Cousin 12       maternal cousin   Brain cancer Cousin 20       paternal cousin   Colon cancer Neg Hx    Colon polyps Neg Hx    Crohn's disease Neg Hx    Esophageal cancer Neg Hx    Rectal cancer Neg Hx    Stomach cancer Neg Hx    Ulcerative colitis Neg Hx    Inflammatory bowel disease Neg Hx    Liver disease Neg Hx    Pancreatic cancer Neg Hx     Past Surgical History:  Procedure Laterality Date   ABDOMINAL HYSTERECTOMY     APPENDECTOMY     BILATERAL TOTAL MASTECTOMY WITH AXILLARY LYMPH NODE DISSECTION  08/30/2013   Dr ARON   BREAST CYST ASPIRATION     9 cysts   CATARACT EXTRACTION,  BILATERAL  2005/2007   CHOLECYSTECTOMY     COLONOSCOPY     COLONOSCOPY WITH PROPOFOL  N/A 11/10/2023   Procedure: COLONOSCOPY WITH PROPOFOL ;  Surgeon: Mansouraty, Aloha Raddle., MD;  Location: [facility];  Service: Gastroenterology;  Laterality: N/A;   CYSTOCELE REPAIR     DRUG INDUCED ENDOSCOPY N/A 05/30/2024   Procedure: DRUG INDUCED SLEEP ENDOSCOPY;  Surgeon: Okey Burns, MD;  Location: [facility];  Service: ENT;  Laterality: N/A;   ENDOSCOPIC MUCOSAL RESECTION N/A 11/10/2023   Procedure: ENDOSCOPIC MUCOSAL RESECTION;  Surgeon: Wilhelmenia Aloha Raddle., MD;  Location: [facility];  Service: Gastroenterology;  Laterality: N/A;   EVACUATION BREAST HEMATOMA Left 08/31/2013   Procedure: EVACUATION HEMATOMA BREAST;  Surgeon: Jina Aron, MD;  Location: [facility];  Service: General;  Laterality: Left;   EYE SURGERY     to repair macular hole   FOOT ARTHROPLASTY     lt    GANGLION CYST EXCISION     rt foot   HEMORRHOID SURGERY     03/1993   HEMOSTASIS CLIP PLACEMENT  11/10/2023   Procedure: HEMOSTASIS CLIP PLACEMENT;  Surgeon: Wilhelmenia Aloha Raddle., MD;  Location: [facility];  Service: Gastroenterology;;   IMPLANTATION OF HYPOGLOSSAL NERVE STIMULATOR N/A 06/29/2024   Procedure: INSERTION, HYPOGLOSSAL NERVE STIMULATOR;  Surgeon: Okey Burns, MD;  Location: [facility];  Service: ENT;  Laterality: N/A;   JOINT REPLACEMENT  03/15/11   left knee replacement   KNEE ARTHROSCOPY     /partial knee 2016/left knee 2012   MASS EXCISION  11/04/2011   Procedure: EXCISION MASS;  Surgeon: Lamar LULLA Leonor Raddle., MD;  Location: [facility];  Service: Orthopedics;  Laterality: Right;  excisional biopsy right ulna mass   MASTECTOMY W/ SENTINEL NODE BIOPSY Right 08/30/2013   Procedure: RIGHT  AXILLARY SENTINEL LYMPH NODE BIOPSY; Right Axillary Node Disection;  Surgeon: Jina Aron, MD;  Location: [facility];  Service: General;  Laterality: Right;  Right side nuc med  7:00    PARTIAL  KNEE ARTHROPLASTY Right 11/03/2015   Procedure: RIGHT KNEE MEDIAL UNICOMPARTMENTAL ARTHROPLASTY ;  Surgeon: Dempsey Moan, MD;  Location: [facility];  Service: Orthopedics;  Laterality: Right;   POLYPECTOMY  11/10/2023   Procedure: POLYPECTOMY;  Surgeon: Mansouraty, Aloha Raddle., MD;  Location: [facility];  Service: Gastroenterology;;   RECTOCELE REPAIR     SIMPLE MASTECTOMY WITH AXILLARY SENTINEL NODE BIOPSY Left 08/30/2013   Procedure: Bilateral Breast Mastectomy ;  Surgeon: Jina Nephew, MD;  Location: [facility];  Service: General;  Laterality: Left;   skin tags removed     breast, panty line, neckline   SUBMUCOSAL LIFTING INJECTION  11/10/2023   Procedure: SUBMUCOSAL LIFTING INJECTION;  Surgeon: Wilhelmenia Aloha Raddle., MD;  Location: [facility];  Service: Gastroenterology;;   TOE SURGERY     preventative crossover toe surg/right foot   TOE SURGERY  2009   left foot/screw  in 2nd toe   TONSILLECTOMY     UPPER GASTROINTESTINAL ENDOSCOPY     Social History   Occupational History   Occupation: retired bookkeeper  Tobacco Use   Smoking status: Former    Current packs/day: 0.00    Average packs/day: 0.1 packs/day for 2.0 years (0.2 ttl pk-yrs)    Types: Cigarettes    Start date: 11/16/1959    Quit date: 11/15/1960    Years since quitting: 63.9    Passive exposure: Past (over 58 years ago)   Smokeless tobacco: Never  Vaping Use   Vaping status: Never Used  Substance and Sexual Activity   Alcohol use: Not Currently   Drug use: No   Sexual activity: Yes    Comment: menarche age 49, fist live birth 63, P 3, hysterectomy age 64, no HRT, BCP 2 yrs

## 2024-10-22 ENCOUNTER — Other Ambulatory Visit: Payer: Self-pay

## 2024-10-22 ENCOUNTER — Telehealth: Payer: Self-pay | Admitting: Gastroenterology

## 2024-10-22 MED ORDER — HYOSCYAMINE SULFATE 0.125 MG SL SUBL: 0.1250 mg | SUBLINGUAL_TABLET | Freq: Two times a day (BID) | SUBLINGUAL | 6 refills | Status: DC

## 2024-10-22 MED ORDER — HYOSCYAMINE SULFATE 0.125 MG SL SUBL: 0.1250 mg | SUBLINGUAL_TABLET | Freq: Two times a day (BID) | SUBLINGUAL | 1 refills | Status: DC

## 2024-10-22 NOTE — Telephone Encounter (Signed)
 Refill for Levsin  [facility]

## 2024-10-22 NOTE — Telephone Encounter (Signed)
 Inbound call from patient husband stating that he spoke to Dr. Wilhelmenia in regard to refilling his wife prescription called Levsin  SL 0.125 MG SL tablet. Patient was prescribed this medication with no refill and was told that we could refill her medication with future refill. Please advise.

## 2024-10-24 ENCOUNTER — Inpatient Hospital Stay: Attending: Hematology & Oncology

## 2024-10-24 ENCOUNTER — Inpatient Hospital Stay: Admitting: Licensed Clinical Social Worker

## 2024-10-24 ENCOUNTER — Encounter: Payer: Self-pay | Admitting: *Deleted

## 2024-10-24 ENCOUNTER — Inpatient Hospital Stay

## 2024-10-24 ENCOUNTER — Inpatient Hospital Stay: Admitting: Hematology & Oncology

## 2024-10-24 ENCOUNTER — Other Ambulatory Visit: Payer: Self-pay

## 2024-10-24 ENCOUNTER — Encounter: Payer: Self-pay | Admitting: Hematology & Oncology

## 2024-10-24 VITALS — BP 110/56 | HR 79 | Temp 98.1°F | Resp 18 | Ht 59.0 in | Wt 159.0 lb

## 2024-10-24 DIAGNOSIS — Z79810 Long term (current) use of selective estrogen receptor modulators (SERMs): Secondary | ICD-10-CM | POA: Diagnosis not present

## 2024-10-24 DIAGNOSIS — Z808 Family history of malignant neoplasm of other organs or systems: Secondary | ICD-10-CM | POA: Insufficient documentation

## 2024-10-24 DIAGNOSIS — C50511 Malignant neoplasm of lower-outer quadrant of right female breast: Secondary | ICD-10-CM

## 2024-10-24 DIAGNOSIS — Z170 Estrogen receptor positive status [ER+]: Secondary | ICD-10-CM | POA: Insufficient documentation

## 2024-10-24 DIAGNOSIS — C50919 Malignant neoplasm of unspecified site of unspecified female breast: Secondary | ICD-10-CM | POA: Diagnosis present

## 2024-10-24 DIAGNOSIS — M899 Disorder of bone, unspecified: Secondary | ICD-10-CM | POA: Insufficient documentation

## 2024-10-24 DIAGNOSIS — Z1732 Human epidermal growth factor receptor 2 negative status: Secondary | ICD-10-CM | POA: Diagnosis not present

## 2024-10-24 DIAGNOSIS — Z79899 Other long term (current) drug therapy: Secondary | ICD-10-CM | POA: Insufficient documentation

## 2024-10-24 DIAGNOSIS — Z87891 Personal history of nicotine dependence: Secondary | ICD-10-CM | POA: Insufficient documentation

## 2024-10-24 DIAGNOSIS — Z803 Family history of malignant neoplasm of breast: Secondary | ICD-10-CM | POA: Insufficient documentation

## 2024-10-24 LAB — CBC WITH DIFFERENTIAL (CANCER CENTER ONLY)
Abs Immature Granulocytes: 0.07 K/uL (ref 0.00–0.07)
Basophils Absolute: 0 K/uL (ref 0.0–0.1)
Basophils Relative: 0 %
Eosinophils Absolute: 0.2 K/uL (ref 0.0–0.5)
Eosinophils Relative: 2 %
HCT: 40.1 % (ref 36.0–46.0)
Hemoglobin: 13.1 g/dL (ref 12.0–15.0)
Immature Granulocytes: 1 %
Lymphocytes Relative: 22 %
Lymphs Abs: 2.1 K/uL (ref 0.7–4.0)
MCH: 28.7 pg (ref 26.0–34.0)
MCHC: 32.7 g/dL (ref 30.0–36.0)
MCV: 87.7 fL (ref 80.0–100.0)
Monocytes Absolute: 0.7 K/uL (ref 0.1–1.0)
Monocytes Relative: 7 %
Neutro Abs: 6.5 K/uL (ref 1.7–7.7)
Neutrophils Relative %: 68 %
Platelet Count: 316 K/uL (ref 150–400)
RBC: 4.57 MIL/uL (ref 3.87–5.11)
RDW: 14.3 % (ref 11.5–15.5)
WBC Count: 9.6 K/uL (ref 4.0–10.5)
nRBC: 0 % (ref 0.0–0.2)

## 2024-10-24 LAB — LACTATE DEHYDROGENASE: LDH: 142 U/L (ref 105–235)

## 2024-10-24 LAB — CMP (CANCER CENTER ONLY)
ALT: 36 U/L (ref 0–44)
AST: 25 U/L (ref 15–41)
Albumin: 4.1 g/dL (ref 3.5–5.0)
Alkaline Phosphatase: 198 U/L — ABNORMAL HIGH (ref 38–126)
Anion gap: 14 (ref 5–15)
BUN: 8 mg/dL (ref 8–23)
CO2: 23 mmol/L (ref 22–32)
Calcium: 9.9 mg/dL (ref 8.9–10.3)
Chloride: 101 mmol/L (ref 98–111)
Creatinine: 0.67 mg/dL (ref 0.44–1.00)
GFR, Estimated: >60 mL/min (ref >60)
Glucose, Bld: 153 mg/dL — ABNORMAL HIGH (ref 70–99)
Potassium: 3.9 mmol/L (ref 3.5–5.1)
Sodium: 138 mmol/L (ref 135–145)
Total Bilirubin: 0.4 mg/dL (ref 0.0–1.2)
Total Protein: 7.1 g/dL (ref 6.5–8.1)

## 2024-10-24 MED ORDER — MEGESTROL ACETATE 400 MG/10ML PO SUSP: 400.0000 mg | Freq: Two times a day (BID) | ORAL | 3 refills | Status: AC

## 2024-10-24 NOTE — Progress Notes (Signed)
 Patient is the wife of a current patient. She was seen by Dr Layla in the past for a diagnosis of stage IIIA breast cancer diagnosed in 07/2013. She was recently found to have presumed metastatic breast cancer with a scan. She self referred to this office.    Initial RN Navigator Patient Visit  Name: Jean Nash Diagnosis: hx Breast Cancer in 2014 with newly found bone lesions  Met with patient prior to their visit with MD. Twyla patient Your Patient Navigator handout which explains my role, areas in which I am able to help, and all the contact information for myself and the office. Also gave patient MD and Navigator business card. Reviewed with patient the general overview of expected course after initial diagnosis and time frame for all steps to be completed.  New patient packet given to patient which includes: orientation to office and staff; campus directory; education on My Chart and Advance Directives; and patient centered education on breast cancer.   Patient comes in with her husband. He is a supportive caregiver, but he also has met pancreatic cancer. She has severe memory deficits and requests that her husband be part of any conversations and planning.   Message sent to genetics. She had testing in 2014 but there may be benefit to retesting. Patient and husband are interested in testing if indicated.   Referral placed for social work and nutrition per protocol.   Patient completed visit with Dr.Ennever. Once office note dictated and orders placed with begin to work with scheduling.   Patient understands all follow up procedures and expectations. They have my number to reach out for any further clarification or additional needs.    Oncology Nurse Navigator Documentation     10/24/2024   12:00 PM  Oncology Nurse Navigator Flowsheets  Abnormal Finding Date 10/20/2024  Navigator Follow Up Date: 10/25/2024  Navigator Follow Up Reason: Appointment Review  Navigator Location  [facility]  Navigator Encounter Type Initial MedOnc  Patient Visit Type MedOnc  Treatment Phase Pre-Tx/Tx Discussion  Barriers/Navigation Needs Coordination of Care;Education  Education Newly Diagnosed Cancer Education;Pain/ Symptom Management;Preparing for Upcoming Surgery/ Treatment  Interventions Education;Psycho-Social Support;Referrals  Acuity Level 2-Minimal Needs (1-2 Barriers Identified)  Referrals Nutrition/dietician;Social Work  Nature Conservation Officer Groups/Services Friends and Family  Time Spent with Patient 30

## 2024-10-24 NOTE — Progress Notes (Signed)
 [facility] Clinical Social Work  Clinical Social Work was referred by statistician for assessment of psychosocial needs.  Clinical Social Worker met with patient to offer support and assess for needs.  CSW is familiar with patient's husband, Ubaldo, who receives care at [facility].   Interventions: Provided patient with information about CSW role, including advance directives, resources and counseling.  She stated her husband takes very good care of her.       Follow Up Plan:  CSW will follow-up with patient by phone     Macario CHRISTELLA Au, LCSW  Clinical Social Worker [facility]

## 2024-10-25 ENCOUNTER — Encounter: Payer: Self-pay | Admitting: *Deleted

## 2024-10-25 DIAGNOSIS — C50511 Malignant neoplasm of lower-outer quadrant of right female breast: Secondary | ICD-10-CM

## 2024-10-25 LAB — CANCER ANTIGEN 27.29: CA 27.29: 256.2 U/mL — ABNORMAL HIGH (ref 0.0–38.6)

## 2024-10-25 NOTE — Progress Notes (Signed)
 Referral MD  Reason for Referral: Possibly metastatic breast cancer-bone metastasis  Chief Complaint  Patient presents with   New Patient (Initial Visit)  : I am not sure what is going on.  HPI: Mrs. Mclaren is a very nice 83 year old white female.  She is wife of our patient.  She has a very interesting history.  She has a history of breast cancer.  This was her breast cancer was ER positive and HER2 negative.  This was locally advanced breast cancer I think back in 2014.  She did have radiation therapy.  She did not have any chemotherapy.  She is on tamoxifen  I think for 5 years.  Recently, she has lost some weight.  She has been having abdominal pain.  She saw Dr. Wilhelmenia of Gastroenterology.  As always, he is incredibly astute.  He did a CT scan of her body.  This probably showed sclerotic bony lesions.  There is nothing in her organs with respect to her lung or liver.  Again, she was treated with radiation therapy and hormonal therapy.  I think she had 9 positive lymph nodes.    She does have some other health issues.  She does have a Inspire device to help with her sleep apnea.  She has had decreased appetite.  I will try her on some Megace  elixir to see if this may help.  Again she is not really complaining of any pain.  She has some arthritic pain..  There is no problems with going to the bathroom.  She is having no issues with diarrhea or constipation.  There is been no leg swelling.  She has had no bleeding.  She has had no rashes.  We did check a CA 27.29 on her.  It was elevated at 256.  Overall, I will have to set her performance status is probably ECOG 2.    Past Medical History:  Diagnosis Date   Adenomatous colon polyp    Anxiety    PHOBIAS   Arthritis    Breast cancer (HCC) 08/08/2013   right LOQ   Cataract    Chronic insomnia    Cluster headaches    history of migraines / NONE FOR SEVERAL YRS   Depression    Diabetes mellitus without complication  (HCC)    Diverticulosis    Fatty liver 2011   Fibromyalgia    GERD (gastroesophageal reflux disease)    H/O hiatal hernia    History of transfusion 08/30/2013   Hx of radiation therapy 10/29/13- 12/14/13   right chest wall 5040 cGy 28 sessions, right supraclavicular/axillary region 5040 cGy 28 sessions, right chest wall boost 1000 cGy 5 sessions   Hypothyroidism    Internal hemorrhoids    Irritable bowel syndrome    Kidney stone    Lymphedema    RT ARM - WEARS SLEEVE   Macular degeneration    hole/right eye   MDD (major depressive disorder)    Osteopenia    Osteopenia    Other abnormal glucose    Other and unspecified hyperlipidemia    Pain in joint, shoulder region    Pneumonia 8032,7990   Sleep apnea    does not use CPAP   Stress incontinence, female   :   Past Surgical History:  Procedure Laterality Date   ABDOMINAL HYSTERECTOMY     APPENDECTOMY     BILATERAL TOTAL MASTECTOMY WITH AXILLARY LYMPH NODE DISSECTION  08/30/2013   Dr ARON   BREAST CYST ASPIRATION  9 cysts   CATARACT EXTRACTION, BILATERAL  2005/2007   CHOLECYSTECTOMY     COLONOSCOPY     COLONOSCOPY WITH PROPOFOL  N/A 11/10/2023   Procedure: COLONOSCOPY WITH PROPOFOL ;  Surgeon: Wilhelmenia Aloha Raddle., MD;  Location: [facility];  Service: Gastroenterology;  Laterality: N/A;   CYSTOCELE REPAIR     DRUG INDUCED ENDOSCOPY N/A 05/30/2024   Procedure: DRUG INDUCED SLEEP ENDOSCOPY;  Surgeon: Okey Burns, MD;  Location: [facility];  Service: ENT;  Laterality: N/A;   ENDOSCOPIC MUCOSAL RESECTION N/A 11/10/2023   Procedure: ENDOSCOPIC MUCOSAL RESECTION;  Surgeon: Wilhelmenia Aloha Raddle., MD;  Location: [facility];  Service: Gastroenterology;  Laterality: N/A;   EVACUATION BREAST HEMATOMA Left 08/31/2013   Procedure: EVACUATION HEMATOMA BREAST;  Surgeon: Jina Nephew, MD;  Location: [facility];  Service: General;  Laterality: Left;   EYE SURGERY     to repair macular hole   FOOT ARTHROPLASTY      lt    GANGLION CYST EXCISION     rt foot   HEMORRHOID SURGERY     03/1993   HEMOSTASIS CLIP PLACEMENT  11/10/2023   Procedure: HEMOSTASIS CLIP PLACEMENT;  Surgeon: Wilhelmenia Aloha Raddle., MD;  Location: [facility];  Service: Gastroenterology;;   IMPLANTATION OF HYPOGLOSSAL NERVE STIMULATOR N/A 06/29/2024   Procedure: INSERTION, HYPOGLOSSAL NERVE STIMULATOR;  Surgeon: Okey Burns, MD;  Location: [facility];  Service: ENT;  Laterality: N/A;   JOINT REPLACEMENT  03/15/11   left knee replacement   KNEE ARTHROSCOPY     /partial knee 2016/left knee 2012   MASS EXCISION  11/04/2011   Procedure: EXCISION MASS;  Surgeon: Lamar LULLA Leonor Raddle., MD;  Location: [facility];  Service: Orthopedics;  Laterality: Right;  excisional biopsy right ulna mass   MASTECTOMY W/ SENTINEL NODE BIOPSY Right 08/30/2013   Procedure: RIGHT  AXILLARY SENTINEL LYMPH NODE BIOPSY; Right Axillary Node Disection;  Surgeon: Jina Nephew, MD;  Location: [facility];  Service: General;  Laterality: Right;  Right side nuc med 7:00    PARTIAL KNEE ARTHROPLASTY Right 11/03/2015   Procedure: RIGHT KNEE MEDIAL UNICOMPARTMENTAL ARTHROPLASTY ;  Surgeon: Dempsey Moan, MD;  Location: [facility];  Service: Orthopedics;  Laterality: Right;   POLYPECTOMY  11/10/2023   Procedure: POLYPECTOMY;  Surgeon: Mansouraty, Aloha Raddle., MD;  Location: [facility];  Service: Gastroenterology;;   RECTOCELE REPAIR     SIMPLE MASTECTOMY WITH AXILLARY SENTINEL NODE BIOPSY Left 08/30/2013   Procedure: Bilateral Breast Mastectomy ;  Surgeon: Jina Nephew, MD;  Location: [facility];  Service: General;  Laterality: Left;   skin tags removed     breast, panty line, neckline   SUBMUCOSAL LIFTING INJECTION  11/10/2023   Procedure: SUBMUCOSAL LIFTING INJECTION;  Surgeon: Wilhelmenia Aloha Raddle., MD;  Location: [facility];  Service: Gastroenterology;;   TOE SURGERY     preventative crossover toe surg/right foot   TOE SURGERY  2009   left foot/screw  in  2nd toe   TONSILLECTOMY     UPPER GASTROINTESTINAL ENDOSCOPY    :  Current Medications[1]:  :  Allergies[2]:   Family History  Problem Relation Age of Onset   Stroke Mother        died age 73   Diabetes Mother    Breast cancer Mother 34   Breast cancer Sister 61   Brain cancer Maternal Uncle 8   Breast cancer Paternal Aunt 18   Breast cancer Paternal Aunt        dx in her 44s  Cancer Maternal Grandmother        intra-abdominal cancer   Diabetes Maternal Grandfather    Breast cancer Paternal Grandmother 43   Brain cancer Cousin 36       maternal cousin   Brain cancer Cousin 20       paternal cousin   Colon cancer Neg Hx    Colon polyps Neg Hx    Crohn's disease Neg Hx    Esophageal cancer Neg Hx    Rectal cancer Neg Hx    Stomach cancer Neg Hx    Ulcerative colitis Neg Hx    Inflammatory bowel disease Neg Hx    Liver disease Neg Hx    Pancreatic cancer Neg Hx   :   Social History   Socioeconomic History   Marital status: Married    Spouse name: Ubaldo   Number of children: 3   Years of education: Not on file   Highest education level: Some college, no degree  Occupational History   Occupation: retired catering manager  Tobacco Use   Smoking status: Former    Current packs/day: 0.00    Average packs/day: 0.1 packs/day for 2.0 years (0.2 ttl pk-yrs)    Types: Cigarettes    Start date: 11/16/1959    Quit date: 11/15/1960    Years since quitting: 63.9    Passive exposure: Past (over 58 years ago)   Smokeless tobacco: Never  Vaping Use   Vaping status: Never Used  Substance and Sexual Activity   Alcohol use: Not Currently   Drug use: No   Sexual activity: Yes    Comment: menarche age 31, fist live birth 45, P 3, hysterectomy age 77, no HRT, BCP 2 yrs  Other Topics Concern   Not on file  Social History Narrative   Occupation:  Retired catering manager    Married with 3 grown children      Never Smoked     Alcohol use-no        Social Drivers of Health    Tobacco Use: Medium Risk (10/24/2024)   Patient History    Smoking Tobacco Use: Former    Smokeless Tobacco Use: Never    Passive Exposure: Past  Physicist, Medical Strain: Low Risk (09/17/2024)   Overall Financial Resource Strain (CARDIA)    Difficulty of Paying Living Expenses: Not hard at all  Food Insecurity: No Food Insecurity (10/24/2024)   Epic    Worried About Programme Researcher, Broadcasting/film/video in the Last Year: Never true    Ran Out of Food in the Last Year: Never true  Transportation Needs: No Transportation Needs (10/24/2024)   Epic    Lack of Transportation (Medical): No    Lack of Transportation (Non-Medical): No  Physical Activity: Inactive (09/17/2024)   Exercise Vital Sign    Days of Exercise per Week: 0 days    Minutes of Exercise per Session: Not on file  Stress: Stress Concern Present (06/23/2024)   [employer] of Occupational Health - Occupational Stress Questionnaire    Feeling of Stress: To some extent  Social Connections: Unknown (09/17/2024)   Social Connection and Isolation Panel    Frequency of Communication with Friends and Family: Twice a week    Frequency of Social Gatherings with Friends and Family: Never    Attends Religious Services: Not on Marketing Executive or Organizations: No    Attends Banker Meetings: Not on file    Marital Status: Married  Catering Manager Violence:  Not At Risk (10/24/2024)   Epic    Fear of Current or Ex-Partner: No    Emotionally Abused: No    Physically Abused: No    Sexually Abused: No  Depression (PHQ2-9): Low Risk (10/24/2024)   Depression (PHQ2-9)    PHQ-2 Score: 0  Recent Concern: Depression (PHQ2-9) - High Risk (09/27/2024)   Depression (PHQ2-9)    PHQ-2 Score: 16  Alcohol Screen: Low Risk (06/23/2024)   Alcohol Screen    Last Alcohol Screening Score (AUDIT): 1  Housing: Low Risk (10/24/2024)   Epic    Unable to Pay for Housing in the Last Year: No    Number of Times Moved in the Last Year:  0    Homeless in the Last Year: No  Utilities: Not At Risk (10/24/2024)   Epic    Threatened with loss of utilities: No  Health Literacy: Not on file  :  Review of Systems  Constitutional:  Positive for malaise/fatigue and weight loss.  HENT: Negative.    Eyes: Negative.   Respiratory: Negative.    Cardiovascular: Negative.   Gastrointestinal: Negative.   Genitourinary: Negative.   Musculoskeletal:  Positive for joint pain and myalgias.  Skin: Negative.   Neurological:  Positive for focal weakness.  Endo/Heme/Allergies: Negative.   Psychiatric/Behavioral: Negative.       Exam:  Vital signs show temperature of 98.1.  Pulse 79.  Blood pressure 110/56.  Weight is 159 pounds.  @IPVITALS @ Physical Exam Vitals reviewed.  HENT:     Head: Normocephalic and atraumatic.  Eyes:     Pupils: Pupils are equal, round, and reactive to light.  Cardiovascular:     Rate and Rhythm: Normal rate and regular rhythm.     Heart sounds: Normal heart sounds.  Pulmonary:     Effort: Pulmonary effort is normal.     Breath sounds: Normal breath sounds.  Abdominal:     General: Bowel sounds are normal.     Palpations: Abdomen is soft.  Musculoskeletal:        General: No tenderness or deformity. Normal range of motion.     Cervical back: Normal range of motion.  Lymphadenopathy:     Cervical: No cervical adenopathy.  Skin:    General: Skin is warm and dry.     Findings: No erythema or rash.  Neurological:     Mental Status: She is alert and oriented to person, place, and time.  Psychiatric:        Behavior: Behavior normal.        Thought Content: Thought content normal.        Judgment: Judgment normal.     Recent Labs    10/24/24 1240  WBC 9.6  HGB 13.1  HCT 40.1  PLT 316    Recent Labs    10/24/24 1240  NA 138  K 3.9  CL 101  CO2 23  GLUCOSE 153*  BUN 8  CREATININE 0.67  CALCIUM  9.9    Blood smear review: None  Pathology: None    Assessment and Plan: Ms.  Oddo is a very nice 83 year old white female.  She appears to have metastatic breast cancer now.  The elevated CA 27.29 is certainly indicative of this.  I really think we need to get a PET scan on her.  I think this will certainly be quite helpful.  I am also going to get a liquid biopsy.  I still think a biopsy of one of her bone lesions can help  us  out because the molecular studies that we need would be unavailable given the processing of bony lesion.  I would have to think that her tumor is ER positive.  I think that we probably would do well with antiestrogen therapy.  I probably would consider her for a CDK 4/CDK 6 inhibitor.  I also may think about either an aromatase inhibitor or Faslodex.  If the liquid biopsy shows that she has an ESR-1 mutation, then we might be able to use one of the newer agents for her.  Again, our goal clearly is quality of life.  She has a lot of health issues.  I want to make sure that we try to focus on her quality of life so that she will be able to function and be able to enjoy which she can do without being miserable.  We will see about the PET scan.  Hopefully it will be done in a week or so.  I will then have to get her back so that we can try to finalize how we want to treat her.  I will put her on some Megace .  This will will help her appetite.  In addition, this may also have an anticancer effect.  I am just surprised by all of this.  I really hate that she does have metastatic disease.  Hopefully, we will be able to see a good response so that she will have a good quality of life and a decent quantity of life.       [1]  Current Outpatient Medications:    megestrol  (MEGACE ) 400 MG/10ML suspension, Take 10 mLs (400 mg total) by mouth 2 (two) times daily., Disp: 500 mL, Rfl: 3   acetaminophen  (TYLENOL ) 500 MG tablet, Take 1 tablet (500 mg total) by mouth every 6 (six) hours., Disp: 30 tablet, Rfl: 0   acidophilus (RISAQUAD) CAPS capsule,  Take 1 capsule by mouth daily., Disp: , Rfl:    Blood Glucose Monitoring Suppl DEVI, 1 each by Does not apply route in the morning, at noon, and at bedtime. May substitute to any manufacturer covered by patient's insurance., Disp: 1 each, Rfl: 0   chlorproMAZINE (THORAZINE) 25 MG tablet, Take 12.5-25 mg by mouth 3 (three) times daily as needed (headache)., Disp: , Rfl:    Cholecalciferol (VITAMIN D ) 50 MCG (2000 UT) tablet, Take 2,000 Units by mouth daily., Disp: , Rfl:    DULoxetine  (CYMBALTA ) 30 MG capsule, Take 1 capsule (30 mg total) by mouth every evening., Disp: 90 capsule, Rfl: 1   esomeprazole  (NEXIUM ) 40 MG capsule, TAKE 1 CAPSULE BY MOUTH DAILY AT NOON (Patient taking differently: Take 40 mg by mouth in the morning.), Disp: 365 capsule, Rfl: 0   hyoscyamine  (LEVSIN  SL) 0.125 MG SL tablet, Place 1 tablet (0.125 mg total) under the tongue 2 (two) times daily., Disp: 30 tablet, Rfl: 6   levothyroxine  (SYNTHROID ) 100 MCG tablet, TAKE 1 TABLET BY MOUTH DAILY, Disp: 365 tablet, Rfl: 0   metFORMIN  (GLUCOPHAGE -XR) 500 MG 24 hr tablet, TAKE 1 TABLET BY MOUTH DAILY WITH BREAKFAST (Patient not taking: Reported on 10/05/2024), Disp: 90 tablet, Rfl: 0   Multiple Vitamins-Minerals (PRESERVISION AREDS 2 PO), Take 1 capsule by mouth in the morning and at bedtime., Disp: , Rfl:    Na Sulfate-K Sulfate-Mg Sulfate concentrate (SUPREP BOWEL PREP KIT) 17.5-3.13-1.6 GM/177ML SOLN, Take 1 kit (354 mLs total) by mouth as directed. For colonoscopy prep, Disp: 354 mL, Rfl: 0   omega-3 acid ethyl esters (  LOVAZA ) 1 g capsule, TAKE 1 CAPSULE BY MOUTH TWICE A DAY, Disp: 180 capsule, Rfl: 3   oxyCODONE  (ROXICODONE ) 5 MG immediate release tablet, Take 1 tablet (5 mg total) by mouth every 6 (six) hours as needed for severe pain (pain score 7-10) or breakthrough pain. Crush and take with yogurt if painful to swallow pills, Disp: 25 tablet, Rfl: 0   Rimegepant Sulfate (NURTEC) 75 MG TBDP, DISSOLVE 1 TABLET BY MOUTH DAILY AS  NEEDED FOR ACUTE MIGRAINE ATTACKS, Disp: 8 tablet, Rfl: 3   zonisamide (ZONEGRAN) 50 MG capsule, Take 150 mg by mouth daily with supper., Disp: , Rfl:  [2] No Known Allergies

## 2024-10-26 NOTE — Progress Notes (Signed)
 Patient is in office today for a nurse visit for Influenza Immunization. Patient Injection was given in the  Left deltoid. Patient tolerated injection well.

## 2024-10-26 NOTE — Addendum Note (Signed)
 Addended by: DIONISIO CAMELIA PARAS on: 10/26/2024 01:31 PM   Modules accepted: Orders

## 2024-10-26 NOTE — Progress Notes (Signed)
 During her appointment yesterday, liquid biopsy was sent. She will also need a PET scan which is scheduled for  11/12/2024. Once Dr Timmy has results from these, we will bring her back to treatment discussion.   Patient's husband, Ubaldo reached out to the office to request a referral to Palliative Care. Order placed.   Oncology Nurse Navigator Documentation     10/25/2024   12:30 PM  Oncology Nurse Navigator Flowsheets  Navigator Follow Up Date: 11/12/2024  Navigator Follow Up Reason: Scan Review  Navigator Location [facility]  Navigator Encounter Type MyChart;Appt/Treatment Plan Review  Patient Visit Type MedOnc  Treatment Phase Pre-Tx/Tx Discussion  Barriers/Navigation Needs Coordination of Care;Education  Interventions Coordination of Care  Acuity Level 2-Minimal Needs (1-2 Barriers Identified)  Referrals Palliative Care  Support Groups/Services Friends and Family  Time Spent with Patient 15

## 2024-10-30 ENCOUNTER — Telehealth: Payer: Self-pay | Admitting: Nurse Practitioner

## 2024-10-30 NOTE — Telephone Encounter (Signed)
 Scheduled patient for new palliative appt. Called and spoke with the patients husband, he is aware.

## 2024-10-31 ENCOUNTER — Inpatient Hospital Stay: Admitting: Dietician

## 2024-10-31 NOTE — Progress Notes (Signed)
 Nutrition Assessment: Reached out to patient at home telephone number.    Reason for Assessment: New Patient Assessment   ASSESSMENT:  Patient is 83 year old being worked up possibly metastatic breast cancer-bone metastasis. Scheduled later this month for PET.  PMHx includes IBS (varies diarrhea and constipation, Pre-DM, OSA, GERD, Osteopenia, Osteoarthitis of knee, HLD, colon polyps.  Patient is the spouse of patient also treated by Dr. Timmy daughter is physician, lots of familial support.   Patient relays that she also has memory issues so spouse provide history.  Spouse has history of nutrition counseling with me in past for taste concerns.  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 GI pain for weeks unrelated to volume of foods or specific eating pattern.  Poor bowel habits, spouse gave 2 dulcolax yesterday. She can't remember last bowel movement states it was a long time ago. Mobility is issue, artificial knees uses a walker when out. Fluid intake is low. Fluids:  1 liter of water, Coffee with sweetened creamer 24 oz.(400-500 calories). Eating a little bit more with Megace  that started 2 days ago.  Breakfast: croissant with cheese, bacon, egg or omelet Lunch: trying protein shake uses Premier (low in sugar for DM) Dinner: likes fresh corn cut off cob with steak pieces Cup of fruit eat night mixed fresh, doesn't like prunes or prune juice would eat dried prunes or figs or dates.  Would consider trial of apple juice. Likes peas, carrots, green beans broccoli and both potatoes.   Nutrition Focused Physical Exam: unable to perform NFPE   Medications: no longer taking Metformin , started Megace  2 days ago   Labs: reviewed 10/24/24   Anthropometrics: weight loss 26# (14%) past 4 months significant for timeframe  Height: 59 Weight:  10/24/24  159# UBW: 175-185 BMI: 32.11   Estimated Energy Needs  Kcals: 1800-2300 Protein: 88-110 g Fluid: 2.5L   NUTRITION DIAGNOSIS:  Inadequate PO intake to meet EEN r/t GI pain   INTERVENTION:   Relayed that nutrition services are wrap around service provided at no charge and encouraged continued communication if experiencing any nutritional impact symptoms (NIS).  Immediate concern is sever constipation.  Increase hot liquid, apple juice fluids.  If no BM take dulcolax again, if none by tomorrow call MD for recommendation for stronger laxative.  Increase movement as able to promote BM.  Contact information provided.  MONITORING, EVALUATION, GOAL: weight trends, nutrition impact symptoms, PO intake, labs   Next Visit: remote tomorrow afternoon to check on bowel movement  Micheline Craven, RDN, LDN Registered Dietitian, [facility] Part Time Remote (Usual office hours: Tuesday-Thursday) Mobile: [phone]

## 2024-11-01 ENCOUNTER — Inpatient Hospital Stay: Admitting: Dietician

## 2024-11-01 NOTE — Progress Notes (Signed)
 Nutrition follow up:  Reached out to spouse to check on progress of patients's bowel movement.  She pass stool today after taking more Doculax yesterday.  He states he will push fluids but she tend to be stubborn.  He also said she usually writes down her bowel movements as her short term memory fails her.    Recommended he encourage regular use of laxative is she doesn't go on day 2 so she moves by day 3.  Next visit: Remote next month after MD follow up after Pet 12/29, Colonoscopy and endoscopy 12/30.  Micheline Craven, RDN, LDN Registered Dietitian, [facility] Part Time Remote (Usual office hours: Tuesday-Thursday) Mobile: [phone] Remote Office: [phone]

## 2024-11-05 ENCOUNTER — Other Ambulatory Visit: Payer: Self-pay

## 2024-11-05 ENCOUNTER — Encounter (HOSPITAL_COMMUNITY): Payer: Self-pay | Admitting: Gastroenterology

## 2024-11-05 ENCOUNTER — Telehealth: Payer: Self-pay

## 2024-11-05 NOTE — Progress Notes (Addendum)
 Anesthesia Review:  PCP: [facility] On[facility]- DR Alvia  Cardiologist :  PPM/ ICD: Device Orders: Rep Notified:  Chest x-ray : CT Chest- 10/20/24  EKG : 06/26/24  Echo : Stress test: Cardiac Cath :   Activity level: unable to do a flight of stairs wthiout difficulty  Sleep Study/ CPAP : Has Inspire Device- to bring remote  Fasting Blood Sugar :      / Checks Blood Sugar -- times a day:    DM- type 2 does not check glucose at home  Pt is currently not on Metformin   Blood Thinner/ Instructions /Last Dose: ASA / Instructions/ Last Dose :  Labs- 10/24/24    NO B/P etc to right arm   Both husband and wife completed med hx and preprocedure instructions on call of 11/05/24.

## 2024-11-05 NOTE — Telephone Encounter (Signed)
 Procedure:COLON Procedure date: 12/30/258 Procedure location: [facility] Arrival Time: 1:35 Spoke with the patient Y/N: Y Any prep concerns? N Has the patient obtained the prep from the pharmacy ? Y Do you have a care partner and transportation: Y Any additional concerns? N

## 2024-11-12 ENCOUNTER — Ambulatory Visit (HOSPITAL_COMMUNITY)
Admission: RE | Admit: 2024-11-12 | Discharge: 2024-11-12 | Disposition: A | Discharge status: Home / Self Care | Source: Ambulatory Visit | Attending: Hematology & Oncology | Admitting: Hematology & Oncology

## 2024-11-12 DIAGNOSIS — C50511 Malignant neoplasm of lower-outer quadrant of right female breast: Secondary | ICD-10-CM | POA: Diagnosis present

## 2024-11-12 DIAGNOSIS — Z170 Estrogen receptor positive status [ER+]: Secondary | ICD-10-CM | POA: Insufficient documentation

## 2024-11-12 LAB — GLUCOSE, CAPILLARY: Glucose-Capillary: 141 mg/dL — ABNORMAL HIGH (ref 70–99)

## 2024-11-12 MED ORDER — FLUDEOXYGLUCOSE F - 18 (FDG) INJECTION
7.8200 | Freq: Once | INTRAVENOUS | Status: AC
  Administered 2024-11-12: 7.82 via INTRAVENOUS

## 2024-11-12 NOTE — Anesthesia Preprocedure Evaluation (Addendum)
"                                    Anesthesia Evaluation  Patient identified by MRN, date of birth, ID band Patient awake    Reviewed: Allergy & Precautions, NPO status , Patient's Chart, lab work & pertinent test results  History of Anesthesia Complications Negative for: history of anesthetic complications  Airway Mallampati: II  TM Distance: >3 FB Neck ROM: Full    Dental no notable dental hx. (+) Teeth Intact, Dental Advisory Given   Pulmonary asthma , sleep apnea (no cpap) , former smoker   Pulmonary exam normal breath sounds clear to auscultation       Cardiovascular (-) hypertension(-) Past MI Normal cardiovascular exam Rhythm:Regular Rate:Normal     Neuro/Psych   Anxiety Depression       GI/Hepatic ,GERD  ,,  Endo/Other  diabetes, Type 2Hypothyroidism    Renal/GU      Musculoskeletal  (+) Arthritis ,  Fibromyalgia -  Abdominal   Peds  Hematology   Anesthesia Other Findings   Reproductive/Obstetrics                              Anesthesia Physical Anesthesia Plan  ASA: 3  Anesthesia Plan: MAC   Post-op Pain Management: Minimal or no pain anticipated   Induction:   PONV Risk Score and Plan: Treatment may vary due to age or medical condition  Airway Management Planned: Natural Airway and Nasal Cannula  Additional Equipment: None  Intra-op Plan:   Post-operative Plan:   Informed Consent: I have reviewed the patients History and Physical, chart, labs and discussed the procedure including the risks, benefits and alternatives for the proposed anesthesia with the patient or authorized representative who has indicated his/her understanding and acceptance.       Plan Discussed with:   Anesthesia Plan Comments: (WT loss change in bowel habits hx of colon polyps for EGD Colon)         Anesthesia Quick Evaluation  "

## 2024-11-13 ENCOUNTER — Encounter: Payer: Self-pay | Admitting: Adult Health

## 2024-11-13 ENCOUNTER — Encounter (HOSPITAL_COMMUNITY): Payer: Self-pay | Admitting: Gastroenterology

## 2024-11-13 ENCOUNTER — Encounter (HOSPITAL_COMMUNITY): Payer: Self-pay | Admitting: Anesthesiology

## 2024-11-13 ENCOUNTER — Encounter: Payer: Self-pay | Admitting: *Deleted

## 2024-11-13 ENCOUNTER — Encounter (HOSPITAL_COMMUNITY)
Admission: RE | Disposition: A | Discharge status: Home / Self Care | Payer: Self-pay | Source: Home / Self Care | Attending: Gastroenterology

## 2024-11-13 ENCOUNTER — Ambulatory Visit (HOSPITAL_COMMUNITY): Payer: Self-pay | Admitting: Anesthesiology

## 2024-11-13 ENCOUNTER — Ambulatory Visit (HOSPITAL_COMMUNITY)
Admission: RE | Admit: 2024-11-13 | Discharge: 2024-11-13 | Disposition: A | Discharge status: Home / Self Care | Attending: Gastroenterology | Admitting: Gastroenterology

## 2024-11-13 ENCOUNTER — Other Ambulatory Visit: Payer: Self-pay

## 2024-11-13 DIAGNOSIS — G4730 Sleep apnea, unspecified: Secondary | ICD-10-CM | POA: Insufficient documentation

## 2024-11-13 DIAGNOSIS — F419 Anxiety disorder, unspecified: Secondary | ICD-10-CM | POA: Insufficient documentation

## 2024-11-13 DIAGNOSIS — K449 Diaphragmatic hernia without obstruction or gangrene: Secondary | ICD-10-CM | POA: Diagnosis not present

## 2024-11-13 DIAGNOSIS — K317 Polyp of stomach and duodenum: Secondary | ICD-10-CM

## 2024-11-13 DIAGNOSIS — J45909 Unspecified asthma, uncomplicated: Secondary | ICD-10-CM | POA: Diagnosis not present

## 2024-11-13 DIAGNOSIS — D122 Benign neoplasm of ascending colon: Secondary | ICD-10-CM | POA: Diagnosis not present

## 2024-11-13 DIAGNOSIS — M797 Fibromyalgia: Secondary | ICD-10-CM | POA: Diagnosis not present

## 2024-11-13 DIAGNOSIS — K635 Polyp of colon: Secondary | ICD-10-CM | POA: Diagnosis not present

## 2024-11-13 DIAGNOSIS — Q439 Congenital malformation of intestine, unspecified: Secondary | ICD-10-CM | POA: Diagnosis not present

## 2024-11-13 DIAGNOSIS — R1030 Lower abdominal pain, unspecified: Secondary | ICD-10-CM | POA: Insufficient documentation

## 2024-11-13 DIAGNOSIS — E039 Hypothyroidism, unspecified: Secondary | ICD-10-CM | POA: Insufficient documentation

## 2024-11-13 DIAGNOSIS — Z9889 Other specified postprocedural states: Secondary | ICD-10-CM

## 2024-11-13 DIAGNOSIS — Z1211 Encounter for screening for malignant neoplasm of colon: Secondary | ICD-10-CM

## 2024-11-13 DIAGNOSIS — K5730 Diverticulosis of large intestine without perforation or abscess without bleeding: Secondary | ICD-10-CM | POA: Diagnosis not present

## 2024-11-13 DIAGNOSIS — R1084 Generalized abdominal pain: Secondary | ICD-10-CM | POA: Diagnosis not present

## 2024-11-13 DIAGNOSIS — K3189 Other diseases of stomach and duodenum: Secondary | ICD-10-CM

## 2024-11-13 DIAGNOSIS — E119 Type 2 diabetes mellitus without complications: Secondary | ICD-10-CM | POA: Insufficient documentation

## 2024-11-13 DIAGNOSIS — R1013 Epigastric pain: Secondary | ICD-10-CM | POA: Diagnosis not present

## 2024-11-13 DIAGNOSIS — R634 Abnormal weight loss: Secondary | ICD-10-CM | POA: Diagnosis not present

## 2024-11-13 DIAGNOSIS — K2970 Gastritis, unspecified, without bleeding: Secondary | ICD-10-CM

## 2024-11-13 DIAGNOSIS — Z6830 Body mass index (BMI) 30.0-30.9, adult: Secondary | ICD-10-CM | POA: Insufficient documentation

## 2024-11-13 DIAGNOSIS — K642 Third degree hemorrhoids: Secondary | ICD-10-CM | POA: Diagnosis not present

## 2024-11-13 DIAGNOSIS — F32A Depression, unspecified: Secondary | ICD-10-CM | POA: Diagnosis not present

## 2024-11-13 DIAGNOSIS — T184XXA Foreign body in colon, initial encounter: Secondary | ICD-10-CM

## 2024-11-13 DIAGNOSIS — F418 Other specified anxiety disorders: Secondary | ICD-10-CM | POA: Diagnosis not present

## 2024-11-13 DIAGNOSIS — Z860101 Personal history of adenomatous and serrated colon polyps: Secondary | ICD-10-CM

## 2024-11-13 DIAGNOSIS — K2289 Other specified disease of esophagus: Secondary | ICD-10-CM

## 2024-11-13 DIAGNOSIS — R630 Anorexia: Secondary | ICD-10-CM

## 2024-11-13 HISTORY — PX: ESOPHAGOGASTRODUODENOSCOPY: SHX5428

## 2024-11-13 HISTORY — PX: POLYPECTOMY: SHX149

## 2024-11-13 HISTORY — PX: BIOPSY OF SKIN SUBCUTANEOUS TISSUE AND/OR MUCOUS MEMBRANE: SHX6741

## 2024-11-13 HISTORY — DX: Personal history of urinary calculi: Z87.442

## 2024-11-13 HISTORY — DX: Dyspnea, unspecified: R06.00

## 2024-11-13 HISTORY — PX: COLONOSCOPY: SHX5424

## 2024-11-13 SURGERY — COLONOSCOPY
Anesthesia: Monitor Anesthesia Care

## 2024-11-13 MED ORDER — SODIUM CHLORIDE 0.9 % IV SOLN: INTRAVENOUS | Status: DC | PRN

## 2024-11-13 MED ORDER — PROPOFOL 500 MG/50ML IV EMUL
INTRAVENOUS | Status: DC | PRN
  Administered 2024-11-13: 600 mg via INTRAVENOUS

## 2024-11-13 MED ORDER — LIDOCAINE HCL (CARDIAC) PF 100 MG/5ML IV SOSY
PREFILLED_SYRINGE | INTRAVENOUS | Status: DC | PRN
  Administered 2024-11-13: 30 mg via INTRAVENOUS

## 2024-11-13 NOTE — Op Note (Signed)
 [facility] Patient Name: Jean Nash Procedure Date: 11/13/2024 MRN: [id number] Attending MD: Aloha Finner , MD, [id number] Date of Birth: [birth year] CSN: [id number] Age: 83 Admit Type: Outpatient Procedure:                Colonoscopy Indications:              High risk colon cancer surveillance: Personal                            history of adenoma (10 mm or greater in size), High                            risk colon cancer surveillance: Personal history of                            non-advanced adenoma, Incidental change in bowel                            habits noted Providers:                Aloha Finner, MD, Jacquelyn Jaci Pierce,                            RN, Corene Southgate, Technician Referring MD:              Medicines:                Monitored Anesthesia Care Complications:            No immediate complications. Estimated Blood Loss:     Estimated blood loss was minimal. Procedure:                Pre-Anesthesia Assessment:                           - Prior to the procedure, a History and Physical                            was performed, and patient medications and                            allergies were reviewed. The patient's tolerance of                            previous anesthesia was also reviewed. The risks                            and benefits of the procedure and the sedation                            options and risks were discussed with the patient.                            All questions were answered, and informed consent  was obtained. Prior Anticoagulants: The patient has                            taken no anticoagulant or antiplatelet agents. ASA                            Grade Assessment: III - A patient with severe                            systemic disease. After reviewing the risks and                            benefits, the patient was deemed in satisfactory                             condition to undergo the procedure.                           After obtaining informed consent, the colonoscope                            was passed under direct vision. Throughout the                            procedure, the patient's blood pressure, pulse, and                            oxygen saturations were monitored continuously. The                            [id number]-HQ190L ([id number] Olympus colonoscope was                            introduced through the anus and advanced to the the                            cecum, identified by appendiceal orifice and                            ileocecal valve. The colonoscopy was somewhat                            difficult due to a tortuous colon. Successful                            completion of the procedure was aided by changing                            the patient's position, using manual pressure,                            straightening and shortening the scope to obtain  bowel loop reduction and using scope torsion. The                            patient tolerated the procedure. The quality of the                            bowel preparation was adequate. The ileocecal                            valve, appendiceal orifice, and rectum were                            photographed. Scope In: 3:13:40 PM Scope Out: 3:40:22 PM Scope Withdrawal Time: 0 hours 21 minutes 2 seconds  Total Procedure Duration: 0 hours 26 minutes 42 seconds  Findings:      The digital rectal exam findings include hemorrhoids. Pertinent       negatives include no palpable rectal lesions.      The left colon was significantly tortuous.      Two sessile polyps were found in the ascending colon. The polyps were 3       to 4 mm in size. These polyps were removed with a cold snare. Resection       and retrieval were complete.      A medium post mucosectomy scar was found in the proximal ascending       colon. There was no evidence of the  previous polyp. There was a single       hemoclip still in place howeverw with removal of an endoclip       accomplished with a snare.      A single small-mouthed diverticulum was found in the ascending colon.      Multiple small-mouthed diverticula were found in the recto-sigmoid       colon, sigmoid colon and descending colon.      Normal mucosa was found in the entire colon otherwise. Biopsies for       histology were taken with a cold forceps from the entire colon for       evaluation of microscopic colitis.      Non-bleeding non-thrombosed external and internal hemorrhoids were found       during retroflexion, during perianal exam and during digital exam. The       hemorrhoids were Grade III (internal hemorrhoids that prolapse but       require manual reduction). Impression:               - Hemorrhoids found on digital rectal exam.                           - Tortuous left colon.                           - Two 3 to 4 mm polyps in the ascending colon,                            removed with a cold snare. Resected and retrieved.                           -  Post mucosectomy scar in the proximal ascending                            colon. Scar site well in appearance with tattoo                            noted and no recurrence noted otherwise. 1 hemoclip                            in place (removed).                           - Diverticulosis in the ascending colon.                           - Diverticulosis in the recto-sigmoid colon, in the                            sigmoid colon and in the descending colon.                           - Normal mucosa in the entire examined colon                            otherwise. Biopsied.                           - Non-bleeding non-thrombosed external and internal                            hemorrhoids. Moderate Sedation:      Not Applicable - Patient had care per Anesthesia. Recommendation:           - The patient will be observed  post-procedure,                            until all discharge criteria are met.                           - Discharge patient to home.                           - Patient has a contact number available for                            emergencies. The signs and symptoms of potential                            delayed complications were discussed with the                            patient. Return to normal activities tomorrow.                            Written discharge instructions were provided to the  patient.                           - High fiber diet.                           - Use FiberCon 1-2 tablets PO daily.                           - Continue present medications.                           - Await pathology results.                           - Repeat colonoscopy in 3 years for surveillance                            would normally be recommended but will need to see                            where things stand at that time (pending patient's                            health and other comorbidities at the time).                           - The findings and recommendations were discussed                            with the patient.                           - The findings and recommendations were discussed                            with the patient's family. Procedure Code(s):        --- Professional ---                           [phone], Colonoscopy, flexible; with removal of                            tumor(s), polyp(s), or other lesion(s) by snare                            technique                           45379, Colonoscopy, flexible; with removal of                            foreign body(s)                           45380, 59, Colonoscopy, flexible; with biopsy,  single or multiple Diagnosis Code(s):        --- Professional ---                           D12.2, Benign neoplasm of ascending colon                            Z98.890, Other specified postprocedural states                           K64.2, Third degree hemorrhoids                           Z86.010, Personal history of colonic polyps                           K57.30, Diverticulosis of large intestine without                            perforation or abscess without bleeding                           Q43.8, Other specified congenital malformations of                            intestine CPT copyright 2022 American Medical Association. All rights reserved. The codes documented in this report are preliminary and upon coder review may  be revised to meet current compliance requirements. Aloha Finner, MD 11/13/2024 3:55:17 PM Number of Addenda: 0

## 2024-11-13 NOTE — H&P (Signed)
 "  GASTROENTEROLOGY PROCEDURE H&P NOTE   Primary Care Physician: Merna Huxley, NP  HPI: Jean Nash is a 83 y.o. female who presents for EGD/Colonoscopy for evaluation of change in bowel habits, abdominal pain, weight loss unintentional, metastatic malignancy to the bones.  Past Medical History:  Diagnosis Date   Adenomatous colon polyp    Anxiety    PHOBIAS   Arthritis    Breast cancer (HCC) 08/08/2013   right LOQ   Cataract    Chronic insomnia    Cluster headaches    history of migraines / NONE FOR SEVERAL YRS   Depression    Diabetes mellitus without complication (HCC)    Diverticulosis    Dyspnea    Fatty liver 2011   Fibromyalgia    GERD (gastroesophageal reflux disease)    H/O hiatal hernia    History of kidney stones    History of transfusion 08/30/2013   Hx of radiation therapy 10/29/13- 12/14/13   right chest wall 5040 cGy 28 sessions, right supraclavicular/axillary region 5040 cGy 28 sessions, right chest wall boost 1000 cGy 5 sessions   Hypothyroidism    Internal hemorrhoids    Irritable bowel syndrome    Lymphedema    RT ARM - WEARS SLEEVE   Macular degeneration    hole/right eye   MDD (major depressive disorder)    Osteopenia    Osteopenia    Other abnormal glucose    Other and unspecified hyperlipidemia    Pain in joint, shoulder region    Pneumonia 8032,7990   Sleep apnea    does not use CPAP   Stress incontinence, female    Past Surgical History:  Procedure Laterality Date   ABDOMINAL HYSTERECTOMY     APPENDECTOMY     BILATERAL TOTAL MASTECTOMY WITH AXILLARY LYMPH NODE DISSECTION  08/30/2013   Dr ARON   BREAST CYST ASPIRATION     9 cysts   CATARACT EXTRACTION, BILATERAL  2005/2007   CHOLECYSTECTOMY     COLONOSCOPY     COLONOSCOPY WITH PROPOFOL  N/A 11/10/2023   Procedure: COLONOSCOPY WITH PROPOFOL ;  Surgeon: Wilhelmenia Aloha Raddle., MD;  Location: [facility];  Service: Gastroenterology;  Laterality: N/A;   CYSTOCELE REPAIR      DRUG INDUCED ENDOSCOPY N/A 05/30/2024   Procedure: DRUG INDUCED SLEEP ENDOSCOPY;  Surgeon: Okey Burns, MD;  Location: [facility];  Service: ENT;  Laterality: N/A;   ENDOSCOPIC MUCOSAL RESECTION N/A 11/10/2023   Procedure: ENDOSCOPIC MUCOSAL RESECTION;  Surgeon: Wilhelmenia Aloha Raddle., MD;  Location: [facility];  Service: Gastroenterology;  Laterality: N/A;   EVACUATION BREAST HEMATOMA Left 08/31/2013   Procedure: EVACUATION HEMATOMA BREAST;  Surgeon: Jina Aron, MD;  Location: [facility];  Service: General;  Laterality: Left;   EYE SURGERY     to repair macular hole   FOOT ARTHROPLASTY     lt    GANGLION CYST EXCISION     rt foot   HEMORRHOID SURGERY     03/1993   HEMOSTASIS CLIP PLACEMENT  11/10/2023   Procedure: HEMOSTASIS CLIP PLACEMENT;  Surgeon: Wilhelmenia Aloha Raddle., MD;  Location: [facility];  Service: Gastroenterology;;   IMPLANTATION OF HYPOGLOSSAL NERVE STIMULATOR N/A 06/29/2024   Procedure: INSERTION, HYPOGLOSSAL NERVE STIMULATOR;  Surgeon: Okey Burns, MD;  Location: [facility];  Service: ENT;  Laterality: N/A;   JOINT REPLACEMENT  03/15/11   left knee replacement   KNEE ARTHROSCOPY     /partial knee 2016/left knee 2012   MASS EXCISION  11/04/2011  Procedure: EXCISION MASS;  Surgeon: Lamar LULLA Leonor Mickey., MD;  Location: [facility];  Service: Orthopedics;  Laterality: Right;  excisional biopsy right ulna mass   MASTECTOMY W/ SENTINEL NODE BIOPSY Right 08/30/2013   Procedure: RIGHT  AXILLARY SENTINEL LYMPH NODE BIOPSY; Right Axillary Node Disection;  Surgeon: Jina Nephew, MD;  Location: [facility];  Service: General;  Laterality: Right;  Right side nuc med 7:00    PARTIAL KNEE ARTHROPLASTY Right 11/03/2015   Procedure: RIGHT KNEE MEDIAL UNICOMPARTMENTAL ARTHROPLASTY ;  Surgeon: Dempsey Moan, MD;  Location: [facility];  Service: Orthopedics;  Laterality: Right;   POLYPECTOMY  11/10/2023   Procedure: POLYPECTOMY;  Surgeon: Mansouraty, Aloha Mickey.,  MD;  Location: [facility];  Service: Gastroenterology;;   RECTOCELE REPAIR     SIMPLE MASTECTOMY WITH AXILLARY SENTINEL NODE BIOPSY Left 08/30/2013   Procedure: Bilateral Breast Mastectomy ;  Surgeon: Jina Nephew, MD;  Location: [facility];  Service: General;  Laterality: Left;   skin tags removed     breast, panty line, neckline   SUBMUCOSAL LIFTING INJECTION  11/10/2023   Procedure: SUBMUCOSAL LIFTING INJECTION;  Surgeon: Wilhelmenia Aloha Mickey., MD;  Location: [facility];  Service: Gastroenterology;;   TOE SURGERY     preventative crossover toe surg/right foot   TOE SURGERY  2009   left foot/screw  in 2nd toe   TONSILLECTOMY     UPPER GASTROINTESTINAL ENDOSCOPY     No current facility-administered medications for this encounter.   Current Medications[1] Allergies[2] Family History  Problem Relation Age of Onset   Stroke Mother        died age 62   Diabetes Mother    Breast cancer Mother 43   Breast cancer Sister 34   Brain cancer Maternal Uncle 8   Breast cancer Paternal Aunt 61   Breast cancer Paternal Aunt        dx in her 61s   Cancer Maternal Grandmother        intra-abdominal cancer   Diabetes Maternal Grandfather    Breast cancer Paternal Grandmother 68   Brain cancer Cousin 39       maternal cousin   Brain cancer Cousin 20       paternal cousin   Colon cancer Neg Hx    Colon polyps Neg Hx    Crohn's disease Neg Hx    Esophageal cancer Neg Hx    Rectal cancer Neg Hx    Stomach cancer Neg Hx    Ulcerative colitis Neg Hx    Inflammatory bowel disease Neg Hx    Liver disease Neg Hx    Pancreatic cancer Neg Hx    Social History   Socioeconomic History   Marital status: Married    Spouse name: Ubaldo   Number of children: 3   Years of education: Not on file   Highest education level: Some college, no degree  Occupational History   Occupation: retired catering manager  Tobacco Use   Smoking status: Former    Current packs/day: 0.00    Average packs/day: 0.1  packs/day for 2.0 years (0.2 ttl pk-yrs)    Types: Cigarettes    Start date: 11/16/1959    Quit date: 11/15/1960    Years since quitting: 64.0    Passive exposure: Past (over 58 years ago)   Smokeless tobacco: Never  Vaping Use   Vaping status: Never Used  Substance and Sexual Activity   Alcohol use: Not Currently   Drug use: No   Sexual activity:  Yes    Comment: menarche age 59, fist live birth 75, P 3, hysterectomy age 43, no HRT, BCP 2 yrs  Other Topics Concern   Not on file  Social History Narrative   Occupation:  Retired catering manager    Married with 3 grown children      Never Smoked     Alcohol use-no        Social Drivers of Health   Tobacco Use: Medium Risk (11/13/2024)   Patient History    Smoking Tobacco Use: Former    Smokeless Tobacco Use: Never    Passive Exposure: Past  Physicist, Medical Strain: Low Risk (09/17/2024)   Overall Financial Resource Strain (CARDIA)    Difficulty of Paying Living Expenses: Not hard at all  Food Insecurity: No Food Insecurity (10/24/2024)   Epic    Worried About Programme Researcher, Broadcasting/film/video in the Last Year: Never true    Ran Out of Food in the Last Year: Never true  Transportation Needs: No Transportation Needs (10/24/2024)   Epic    Lack of Transportation (Medical): No    Lack of Transportation (Non-Medical): No  Physical Activity: Inactive (09/17/2024)   Exercise Vital Sign    Days of Exercise per Week: 0 days    Minutes of Exercise per Session: Not on file  Stress: Stress Concern Present (06/23/2024)   [employer] of Occupational Health - Occupational Stress Questionnaire    Feeling of Stress: To some extent  Social Connections: Unknown (09/17/2024)   Social Connection and Isolation Panel    Frequency of Communication with Friends and Family: Twice a week    Frequency of Social Gatherings with Friends and Family: Never    Attends Religious Services: Not on Insurance Claims Handler of Clubs or Organizations: No    Attends Tax Inspector Meetings: Not on file    Marital Status: Married  Intimate Partner Violence: Not At Risk (10/24/2024)   Epic    Fear of Current or Ex-Partner: No    Emotionally Abused: No    Physically Abused: No    Sexually Abused: No  Depression (PHQ2-9): Low Risk (10/24/2024)   Depression (PHQ2-9)    PHQ-2 Score: 0  Recent Concern: Depression (PHQ2-9) - High Risk (09/27/2024)   Depression (PHQ2-9)    PHQ-2 Score: 16  Alcohol Screen: Low Risk (06/23/2024)   Alcohol Screen    Last Alcohol Screening Score (AUDIT): 1  Housing: Low Risk (10/24/2024)   Epic    Unable to Pay for Housing in the Last Year: No    Number of Times Moved in the Last Year: 0    Homeless in the Last Year: No  Utilities: Not At Risk (10/24/2024)   Epic    Threatened with loss of utilities: No  Health Literacy: Not on file    Physical Exam: There were no vitals filed for this visit. There is no height or weight on file to calculate BMI. GEN: NAD EYE: Sclerae anicteric ENT: MMM CV: Non-tachycardic GI: Soft, NT/ND NEURO:  Alert & Oriented x 3  Lab Results: No results for input(s): WBC, HGB, HCT, PLT in the last 72 hours. BMET No results for input(s): NA, K, CL, CO2, GLUCOSE, BUN, CREATININE, CALCIUM  in the last 72 hours. LFT No results for input(s): PROT, ALBUMIN , AST, ALT, ALKPHOS, BILITOT, BILIDIR, IBILI in the last 72 hours. PT/INR No results for input(s): LABPROT, INR in the last 72 hours.   Impression / Plan: This is a 83 y.o.female  who presents for EGD/Colonoscopy for evaluation of change in bowel habits, abdominal pain, weight loss unintentional, metastatic malignancy to the bones.  The risks and benefits of endoscopic evaluation/treatment were discussed with the patient and/or family; these include but are not limited to the risk of perforation, infection, bleeding, missed lesions, lack of diagnosis, severe illness requiring hospitalization, as well as  anesthesia and sedation related illnesses.  The patient's history has been reviewed, patient examined, no change in status, and deemed stable for procedure.  The patient and/or family was provided an opportunity to ask questions and all were answered.  The patient and/or family is agreeable to proceed.    Aloha Finner, MD [facility] Office # [phone]     [1] No current facility-administered medications for this encounter. [2] No Known Allergies  "

## 2024-11-13 NOTE — Op Note (Signed)
 [facility] Patient Name: Jean Nash Procedure Date: 11/13/2024 MRN: [id number] Attending MD: Aloha Finner , MD, [id number] Date of Birth: Dec 17, 1940 CSN: [id number] Age: 83 Admit Type: Outpatient Procedure:                Upper GI endoscopy Indications:              Epigastric abdominal pain, Lower abdominal pain,                            Generalized abdominal pain, Weight loss Providers:                Aloha Finner, MD, Jacquelyn Jaci Pierce,                            RN, Corene Southgate, Technician Referring MD:              Medicines:                Monitored Anesthesia Care Complications:            No immediate complications. Estimated Blood Loss:     Estimated blood loss was minimal. Procedure:                Pre-Anesthesia Assessment:                           - Prior to the procedure, a History and Physical                            was performed, and patient medications and                            allergies were reviewed. The patient's tolerance of                            previous anesthesia was also reviewed. The risks                            and benefits of the procedure and the sedation                            options and risks were discussed with the patient.                            All questions were answered, and informed consent                            was obtained. Prior Anticoagulants: The patient has                            taken no anticoagulant or antiplatelet agents. ASA                            Grade Assessment: III - A patient with severe  systemic disease. After reviewing the risks and                            benefits, the patient was deemed in satisfactory                            condition to undergo the procedure.                           After obtaining informed consent, the endoscope was                            passed under direct vision. Throughout the                             procedure, the patient's blood pressure, pulse, and                            oxygen saturations were monitored continuously. The                            [id number]-[id number] ([id number] Olympus endoscope was introduced                            through the mouth, and advanced to the second part                            of duodenum. The upper GI endoscopy was                            accomplished without difficulty. The patient                            tolerated the procedure. Scope In: Scope Out: Findings:      No gross lesions were noted in the entire esophagus.      The Z-line was irregular and was found 34 cm from the incisors.      A 1 cm hiatal hernia was present.      Multiple 2 to 7 mm semi-sessile polyps with no bleeding and no stigmata       of recent bleeding were found in the cardia, in the gastric fundus and       in the gastric body. Biopsies were taken with a cold forceps for       histology.      Patchy mildly erythematous mucosa without bleeding was found in the       entire examined stomach. Biopsies were taken with a cold forceps for       histology and Helicobacter pylori testing.      No gross lesions were noted in the duodenal bulb, in the first portion       of the duodenum and in the second portion of the duodenum. Biopsies were       taken with a cold forceps for histology. Impression:               - No gross lesions in the entire esophagus.                           -  Z-line irregular, 34 cm from the incisors.                           - 1 cm hiatal hernia.                           - Multiple gastric polyps. Biopsied.                           - Erythematous mucosa in the stomach. Biopsied.                           - No gross lesions in the duodenal bulb, in the                            first portion of the duodenum and in the second                            portion of the duodenum. Biopsied. Moderate Sedation:      Not Applicable -  Patient had care per Anesthesia. Recommendation:           - Proceed to scheduled colonoscopy.                           - Continue present medications.                           - Await pathology results.                           - The findings and recommendations were discussed                            with the patient.                           - The findings and recommendations were discussed                            with the patient's family. Procedure Code(s):        --- Professional ---                           [phone], Esophagogastroduodenoscopy, flexible,                            transoral; with biopsy, single or multiple Diagnosis Code(s):        --- Professional ---                           K22.89, Other specified disease of esophagus                           K44.9, Diaphragmatic hernia without obstruction or  gangrene                           K31.7, Polyp of stomach and duodenum                           K31.89, Other diseases of stomach and duodenum                           R10.13, Epigastric pain                           R10.30, Lower abdominal pain, unspecified                           R10.84, Generalized abdominal pain                           R63.4, Abnormal weight loss CPT copyright 2022 American Medical Association. All rights reserved. The codes documented in this report are preliminary and upon coder review may  be revised to meet current compliance requirements. Aloha Finner, MD 11/13/2024 3:47:18 PM Number of Addenda: 0

## 2024-11-13 NOTE — Telephone Encounter (Signed)
 Please advise

## 2024-11-13 NOTE — Transfer of Care (Signed)
 Immediate Anesthesia Transfer of Care Note  Patient: Rebbecca Osuna  Procedure(s) Performed: COLONOSCOPY EGD (ESOPHAGOGASTRODUODENOSCOPY) BIOPSY, SKIN, SUBCUTANEOUS TISSUE, OR MUCOUS MEMBRANE REMOVAL, FOREIGN BODY POLYPECTOMY, INTESTINE  Patient Location: Endoscopy Unit  Anesthesia Type:MAC  Level of Consciousness: sedated and responds to stimulation  Airway & Oxygen Therapy: Patient Spontanous Breathing and Patient connected to face mask oxygen  Post-op Assessment: Report given to RN and Post -op Vital signs reviewed and stable  Post vital signs: Reviewed and stable  Last Vitals:  Vitals Value Taken Time  BP 153/66 11/13/24 15:46  Temp 97.2   Pulse 61 11/13/24 15:47  Resp 12 11/13/24 15:47  SpO2 98 % 11/13/24 15:47  Vitals shown include unfiled device data.  Last Pain:  Vitals:   11/13/24 1430  TempSrc: Oral  PainSc: 0-No pain         Complications: No notable events documented.

## 2024-11-13 NOTE — Anesthesia Postprocedure Evaluation (Signed)
"   Anesthesia Post Note  Patient: Jean Nash  Procedure(s) Performed: COLONOSCOPY EGD (ESOPHAGOGASTRODUODENOSCOPY) BIOPSY, SKIN, SUBCUTANEOUS TISSUE, OR MUCOUS MEMBRANE REMOVAL, FOREIGN BODY POLYPECTOMY, INTESTINE     Patient location during evaluation: Endoscopy Anesthesia Type: MAC Level of consciousness: awake and alert Pain management: pain level controlled Vital Signs Assessment: post-procedure vital signs reviewed and stable Respiratory status: spontaneous breathing, nonlabored ventilation, respiratory function stable and patient connected to nasal cannula oxygen Cardiovascular status: stable and blood pressure returned to baseline Postop Assessment: no apparent nausea or vomiting Anesthetic complications: no   No notable events documented.  Last Vitals:  Vitals:   11/13/24 1600 11/13/24 1610  BP: (!) 145/81 124/66  Pulse: (!) 59 (!) 59  Resp: 12 14  Temp:    SpO2: 92% 97%    Last Pain:  Vitals:   11/13/24 1610  TempSrc:   PainSc: 0-No pain                 Garnette LABOR Coreena Rubalcava      "

## 2024-11-13 NOTE — Discharge Instructions (Signed)

## 2024-11-13 NOTE — Progress Notes (Unsigned)
 Reviewed PET which shows widespread osseous disease. CARIS liquid biopsy is also reported. Patient needs to come in a speak to Dr Timmy about results and treatment options. Message sent to scheduling.   Oncology Nurse Navigator Documentation     11/13/2024   11:00 AM  Oncology Nurse Navigator Flowsheets  Navigator Follow Up Date: 11/29/2024  Navigator Follow Up Reason: Follow-up Appointment  Navigator Location [facility]  Navigator Encounter Type Scan Review;Pathology Review  Patient Visit Type MedOnc  Treatment Phase Pre-Tx/Tx Discussion  Barriers/Navigation Needs Coordination of Care;Education  Interventions Coordination of Care  Acuity Level 2-Minimal Needs (1-2 Barriers Identified)  Coordination of Care Appts  Support Groups/Services Friends and Family  Time Spent with Patient 15

## 2024-11-14 ENCOUNTER — Encounter: Payer: Self-pay | Admitting: Nurse Practitioner

## 2024-11-14 ENCOUNTER — Inpatient Hospital Stay: Admitting: Nurse Practitioner

## 2024-11-14 VITALS — BP 115/80 | HR 86 | Temp 98.1°F | Resp 18 | Ht 59.0 in | Wt 160.0 lb

## 2024-11-14 DIAGNOSIS — R630 Anorexia: Secondary | ICD-10-CM | POA: Diagnosis not present

## 2024-11-14 DIAGNOSIS — Z170 Estrogen receptor positive status [ER+]: Secondary | ICD-10-CM

## 2024-11-14 DIAGNOSIS — C50919 Malignant neoplasm of unspecified site of unspecified female breast: Secondary | ICD-10-CM | POA: Diagnosis not present

## 2024-11-14 DIAGNOSIS — Z6832 Body mass index (BMI) 32.0-32.9, adult: Secondary | ICD-10-CM | POA: Diagnosis not present

## 2024-11-14 DIAGNOSIS — G4733 Obstructive sleep apnea (adult) (pediatric): Secondary | ICD-10-CM

## 2024-11-14 DIAGNOSIS — F32A Depression, unspecified: Secondary | ICD-10-CM | POA: Diagnosis not present

## 2024-11-14 DIAGNOSIS — Z515 Encounter for palliative care: Secondary | ICD-10-CM | POA: Diagnosis not present

## 2024-11-14 DIAGNOSIS — Z7189 Other specified counseling: Secondary | ICD-10-CM

## 2024-11-14 DIAGNOSIS — M170 Bilateral primary osteoarthritis of knee: Secondary | ICD-10-CM | POA: Diagnosis not present

## 2024-11-14 DIAGNOSIS — R530 Neoplastic (malignant) related fatigue: Secondary | ICD-10-CM

## 2024-11-14 DIAGNOSIS — M6250 Muscle wasting and atrophy, not elsewhere classified, unspecified site: Secondary | ICD-10-CM | POA: Diagnosis not present

## 2024-11-14 DIAGNOSIS — C50511 Malignant neoplasm of lower-outer quadrant of right female breast: Secondary | ICD-10-CM

## 2024-11-14 LAB — GLUCOSE, CAPILLARY: Glucose-Capillary: 107 mg/dL — ABNORMAL HIGH (ref 70–99)

## 2024-11-14 NOTE — Progress Notes (Signed)
 "    Palliative Medicine [facility]  [phone] [phone]   Name: Jean Nash Date: 11/14/2024 MRN: [id number]  DOB: 1941/07/13  Patient Care Team: Merna Huxley, NP as PCP - General (Family Medicine) Duwayne Purchase, MD as Consulting Physician (Orthopedic Surgery) Timmy Maude SAUNDERS, MD as Medical Oncologist (Oncology) Randine Warren BRAVO, RN as Oncology Nurse Navigator    REASON FOR CONSULTATION: Jean Nash is a 83 y.o. female with oncologic medical history including in situ ductal  breast cancer ER+/HER2- (2014) grade 1 s/p radiation and Tamoxifen  now with sclerotic bony lesions pending PET scan results.  Palliative is seeing patient for symptom management and goals of care.    SOCIAL HISTORY:     reports that she quit smoking about 64 years ago. Her smoking use included cigarettes. She started smoking about 65 years ago. She has a 0.2 pack-year smoking history. She has been exposed to tobacco smoke. She has never used smokeless tobacco. She reports that she does not currently use alcohol. She reports that she does not use drugs.  ADVANCE DIRECTIVES:  On file. Husband Glendora Clouatre is her medical decision maker.   CODE STATUS: Full code  PAST MEDICAL HISTORY: Past Medical History:  Diagnosis Date   Adenomatous colon polyp    Anxiety    PHOBIAS   Arthritis    Breast cancer (HCC) 08/08/2013   right LOQ   Cataract    Chronic insomnia    Cluster headaches    history of migraines / NONE FOR SEVERAL YRS   Depression    Diabetes mellitus without complication (HCC)    Diverticulosis    Dyspnea    Fatty liver 2011   Fibromyalgia    GERD (gastroesophageal reflux disease)    H/O hiatal hernia    History of kidney stones    History of transfusion 08/30/2013   Hx of radiation therapy 10/29/13- 12/14/13   right chest wall 5040 cGy 28 sessions, right supraclavicular/axillary region 5040 cGy 28 sessions, right chest wall boost 1000 cGy  5 sessions   Hypothyroidism    Internal hemorrhoids    Irritable bowel syndrome    Lymphedema    RT ARM - WEARS SLEEVE   Macular degeneration    hole/right eye   MDD (major depressive disorder)    Osteopenia    Osteopenia    Other abnormal glucose    Other and unspecified hyperlipidemia    Pain in joint, shoulder region    Pneumonia 8032,7990   Sleep apnea    does not use CPAP   Stress incontinence, female     PAST SURGICAL HISTORY:  Past Surgical History:  Procedure Laterality Date   ABDOMINAL HYSTERECTOMY     APPENDECTOMY     BILATERAL TOTAL MASTECTOMY WITH AXILLARY LYMPH NODE DISSECTION  08/30/2013   Dr ARON   BREAST CYST ASPIRATION     9 cysts   CATARACT EXTRACTION, BILATERAL  2005/2007   CHOLECYSTECTOMY     COLONOSCOPY     COLONOSCOPY WITH PROPOFOL  N/A 11/10/2023   Procedure: COLONOSCOPY WITH PROPOFOL ;  Surgeon: Wilhelmenia Aloha Raddle., MD;  Location: [facility];  Service: Gastroenterology;  Laterality: N/A;   CYSTOCELE REPAIR     DRUG INDUCED ENDOSCOPY N/A 05/30/2024   Procedure: DRUG INDUCED SLEEP ENDOSCOPY;  Surgeon: Okey Burns, MD;  Location: [facility];  Service: ENT;  Laterality: N/A;   ENDOSCOPIC MUCOSAL RESECTION N/A 11/10/2023   Procedure: ENDOSCOPIC MUCOSAL RESECTION;  Surgeon: Wilhelmenia Aloha Raddle., MD;  Location: [facility];  Service: Gastroenterology;  Laterality: N/A;   EVACUATION BREAST HEMATOMA Left 08/31/2013   Procedure: EVACUATION HEMATOMA BREAST;  Surgeon: Jina Nephew, MD;  Location: [facility];  Service: General;  Laterality: Left;   EYE SURGERY     to repair macular hole   FOOT ARTHROPLASTY     lt    GANGLION CYST EXCISION     rt foot   HEMORRHOID SURGERY     03/1993   HEMOSTASIS CLIP PLACEMENT  11/10/2023   Procedure: HEMOSTASIS CLIP PLACEMENT;  Surgeon: Wilhelmenia Aloha Raddle., MD;  Location: [facility];  Service: Gastroenterology;;   IMPLANTATION OF HYPOGLOSSAL NERVE STIMULATOR N/A 06/29/2024   Procedure:  INSERTION, HYPOGLOSSAL NERVE STIMULATOR;  Surgeon: Okey Burns, MD;  Location: [facility];  Service: ENT;  Laterality: N/A;   JOINT REPLACEMENT  03/15/11   left knee replacement   KNEE ARTHROSCOPY     /partial knee 2016/left knee 2012   MASS EXCISION  11/04/2011   Procedure: EXCISION MASS;  Surgeon: Lamar LULLA Leonor Raddle., MD;  Location: [facility];  Service: Orthopedics;  Laterality: Right;  excisional biopsy right ulna mass   MASTECTOMY W/ SENTINEL NODE BIOPSY Right 08/30/2013   Procedure: RIGHT  AXILLARY SENTINEL LYMPH NODE BIOPSY; Right Axillary Node Disection;  Surgeon: Jina Nephew, MD;  Location: [facility];  Service: General;  Laterality: Right;  Right side nuc med 7:00    PARTIAL KNEE ARTHROPLASTY Right 11/03/2015   Procedure: RIGHT KNEE MEDIAL UNICOMPARTMENTAL ARTHROPLASTY ;  Surgeon: Dempsey Moan, MD;  Location: [facility];  Service: Orthopedics;  Laterality: Right;   POLYPECTOMY  11/10/2023   Procedure: POLYPECTOMY;  Surgeon: Mansouraty, Aloha Raddle., MD;  Location: [facility];  Service: Gastroenterology;;   RECTOCELE REPAIR     SIMPLE MASTECTOMY WITH AXILLARY SENTINEL NODE BIOPSY Left 08/30/2013   Procedure: Bilateral Breast Mastectomy ;  Surgeon: Jina Nephew, MD;  Location: [facility];  Service: General;  Laterality: Left;   skin tags removed     breast, panty line, neckline   SUBMUCOSAL LIFTING INJECTION  11/10/2023   Procedure: SUBMUCOSAL LIFTING INJECTION;  Surgeon: Wilhelmenia Aloha Raddle., MD;  Location: [facility];  Service: Gastroenterology;;   TOE SURGERY     preventative crossover toe surg/right foot   TOE SURGERY  2009   left foot/screw  in 2nd toe   TONSILLECTOMY     UPPER GASTROINTESTINAL ENDOSCOPY      HEMATOLOGY/ONCOLOGY HISTORY:  Oncology History  Malignant neoplasm of lower-outer quadrant of right breast of female, estrogen receptor positive (HCC)  08/09/2013 Initial Diagnosis   Malignant neoplasm of lower-outer quadrant of right breast of female,  estrogen receptor positive (HCC)   11/16/2013 Genetic Testing   CHEK2 c.320-5T>A (IVS2-5T>A) VUS identified on the Breast/Ovarian Cancer Panel.  The Breast/Ovarian gene panel offered by [facility] includes sequencing and rearrangement analysis for the following 21 genes:  ATM, BARD1, BRCA1, BRCA2, BRIP1, CDH1, CHEK2, EPCAM, FANCC, MLH1, MSH2, MSH6, NBN, PALB2, PMS2, PTEN, RAD51C, RAD51D, STK11, TP53, and XRCC2.   UPDATE: CHEK2 c.320-5T>A (IVS2-5T>A) VUS has been reclassified from a VUS to a likely benign variant based on a combination of sources, e.g., internal data, published literature, population databases and in silico models.  The reclassification date is August 01, 2020.     ALLERGIES:  has no known allergies.  MEDICATIONS:  Current Outpatient Medications  Medication Sig Dispense Refill   acetaminophen  (TYLENOL ) 500 MG tablet Take 1 tablet (500 mg total) by mouth every 6 (six) hours. 30 tablet 0  acidophilus (RISAQUAD) CAPS capsule Take 1 capsule by mouth daily.     Blood Glucose Monitoring Suppl DEVI 1 each by Does not apply route in the morning, at noon, and at bedtime. May substitute to any manufacturer covered by patient's insurance. 1 each 0   chlorproMAZINE (THORAZINE) 25 MG tablet Take 12.5-25 mg by mouth 3 (three) times daily as needed (headache).     Cholecalciferol (VITAMIN D ) 50 MCG (2000 UT) tablet Take 2,000 Units by mouth daily.     DULoxetine  (CYMBALTA ) 30 MG capsule Take 1 capsule (30 mg total) by mouth every evening. 90 capsule 1   esomeprazole  (NEXIUM ) 40 MG capsule TAKE 1 CAPSULE BY MOUTH DAILY AT NOON (Patient taking differently: Take 40 mg by mouth in the morning.) 365 capsule 0   hyoscyamine  (LEVSIN  SL) 0.125 MG SL tablet Place 1 tablet (0.125 mg total) under the tongue 2 (two) times daily. 30 tablet 6   levothyroxine  (SYNTHROID ) 100 MCG tablet TAKE 1 TABLET BY MOUTH DAILY 365 tablet 0   megestrol  (MEGACE ) 400 MG/10ML suspension Take 10 mLs (400 mg total) by mouth 2  (two) times daily. 500 mL 3   metFORMIN  (GLUCOPHAGE -XR) 500 MG 24 hr tablet TAKE 1 TABLET BY MOUTH DAILY WITH BREAKFAST (Patient not taking: No sig reported) 90 tablet 0   Multiple Vitamins-Minerals (PRESERVISION AREDS 2 PO) Take 1 capsule by mouth in the morning and at bedtime.     Na Sulfate-K Sulfate-Mg Sulfate concentrate (SUPREP BOWEL PREP KIT) 17.5-3.13-1.6 GM/177ML SOLN Take 1 kit (354 mLs total) by mouth as directed. For colonoscopy prep 354 mL 0   omega-3 acid ethyl esters (LOVAZA ) 1 g capsule TAKE 1 CAPSULE BY MOUTH TWICE A DAY 180 capsule 3   oxyCODONE  (ROXICODONE ) 5 MG immediate release tablet Take 1 tablet (5 mg total) by mouth every 6 (six) hours as needed for severe pain (pain score 7-10) or breakthrough pain. Crush and take with yogurt if painful to swallow pills 25 tablet 0   Rimegepant Sulfate (NURTEC) 75 MG TBDP DISSOLVE 1 TABLET BY MOUTH DAILY AS NEEDED FOR ACUTE MIGRAINE ATTACKS 8 tablet 3   zonisamide (ZONEGRAN) 50 MG capsule Take 150 mg by mouth daily with supper.     No current facility-administered medications for this visit.    VITAL SIGNS: BP 115/80 (BP Location: Left Arm, Patient Position: Sitting)   Pulse 86   Temp 98.1 F (36.7 C) (Temporal)   Resp 18   Ht 4' 11 (1.499 m)   Wt 160 lb (72.6 kg)   SpO2 99%   BMI 32.32 kg/m  Filed Weights   11/14/24 1054  Weight: 160 lb (72.6 kg)    Estimated body mass index is 32.32 kg/m as calculated from the following:   Height as of this encounter: 4' 11 (1.499 m).   Weight as of this encounter: 160 lb (72.6 kg).  LABS: CBC:    Component Value Date/Time   WBC 9.6 10/24/2024 1240   WBC 10.6 (H) 09/26/2024 1650   HGB 13.1 10/24/2024 1240   HGB 14.1 09/01/2017 1127   HCT 40.1 10/24/2024 1240   HCT 42.1 09/01/2017 1127   PLT 316 10/24/2024 1240   PLT 152 09/01/2017 1127   MCV 87.7 10/24/2024 1240   MCV 90.3 09/01/2017 1127   NEUTROABS 6.5 10/24/2024 1240   NEUTROABS 4.5 09/01/2017 1127   LYMPHSABS 2.1  10/24/2024 1240   LYMPHSABS 1.8 09/01/2017 1127   MONOABS 0.7 10/24/2024 1240   MONOABS 0.5 09/01/2017 1127  EOSABS 0.2 10/24/2024 1240   EOSABS 0.1 09/01/2017 1127   BASOSABS 0.0 10/24/2024 1240   BASOSABS 0.0 09/01/2017 1127   Comprehensive Metabolic Panel:    Component Value Date/Time   NA 138 10/24/2024 1240   NA 140 09/01/2017 1127   K 3.9 10/24/2024 1240   K 4.1 09/01/2017 1127   CL 101 10/24/2024 1240   CO2 23 10/24/2024 1240   CO2 24 09/01/2017 1127   BUN 8 10/24/2024 1240   BUN 15.8 09/01/2017 1127   CREATININE 0.67 10/24/2024 1240   CREATININE 0.85 09/12/2020 1349   CREATININE 0.8 09/01/2017 1127   GLUCOSE 153 (H) 10/24/2024 1240   GLUCOSE 132 09/01/2017 1127   CALCIUM  9.9 10/24/2024 1240   CALCIUM  8.7 09/01/2017 1127   AST 25 10/24/2024 1240   AST 20 09/01/2017 1127   ALT 36 10/24/2024 1240   ALT 32 09/01/2017 1127   ALKPHOS 198 (H) 10/24/2024 1240   ALKPHOS 43 09/01/2017 1127   BILITOT 0.4 10/24/2024 1240   BILITOT 0.61 09/01/2017 1127   PROT 7.1 10/24/2024 1240   PROT 6.1 (L) 09/01/2017 1127   ALBUMIN  4.1 10/24/2024 1240   ALBUMIN  3.5 09/01/2017 1127    RADIOGRAPHIC STUDIES: NM PET Image Initial (PI) Skull Base To Thigh Result Date: 11/12/2024 CLINICAL DATA:  Initial treatment strategy for remote history of breast cancer, bone lesions. EXAM: NUCLEAR MEDICINE PET SKULL BASE TO THIGH TECHNIQUE: 7.8 mCi F-18 FDG was injected intravenously. Full-ring PET imaging was performed from the skull base to thigh after the radiotracer. CT data was obtained and used for attenuation correction and anatomic localization. Fasting blood glucose: 141 mg/dl COMPARISON:  CT chest abdomen pelvis 10/10/2024. FINDINGS: Mediastinal blood pool activity: SUV max 2.4 Liver activity: SUV max NA NECK: No abnormal hypermetabolism. Incidental CT findings: Inspire device in the right neck CHEST: No abnormal hypermetabolism. Incidental CT findings: Atherosclerotic calcification of the aorta,  aortic valve and coronary arteries. Heart is enlarged. No pericardial or pleural effusion. Subpleural radiation scarring in the anterior right lung. ABDOMEN/PELVIS: No abnormal hypermetabolism. Incidental CT findings: Cholecystectomy. Tiny angiomyolipoma in the right kidney. Low-attenuation lesion in the left kidney. No specific follow-up necessary. Tiny left renal stone. Proximal gastric wall thickening. Rectal prolapse or rectocele. SKELETON: Patchy hypermetabolism throughout the visualized osseous structures associated with diffuse sclerotic lesions. Index hypermetabolism in the right sacrum, SUV max 6.1. Incidental CT findings: Degenerative changes in the spine. IMPRESSION: 1. Widespread osseous metastatic disease, likely breast cancer in origin. 2. Tiny left renal stone. 3. Proximal gastric wall thickening. 4. Aortic atherosclerosis (ICD10-I70.0). Coronary artery calcification. Electronically Signed   By: Newell Eke M.D.   On: 11/12/2024 14:00   XR Knee 1-2 Views Right Result Date: 10/19/2024 AP lateral radiographs right knee reviewed.  Partial knee replacement is noted with no evidence of component loosening or lucencies at the bone cement interface.  Alignment is mild varus.  Patellofemoral arthritis is present.  Lateral compartment has minimal degenerative changes and joint space narrowing present  XR Knee 1-2 Views Left Result Date: 10/19/2024 AP lateral radiographs left knee reviewed.  Cemented total knee prosthesis in good position and alignment with no complicating features.  No evidence of loosening and no evidence of lucencies around the bone cement interface in any of the 3 components.   PERFORMANCE STATUS (ECOG) : 1 - Symptomatic but completely ambulatory  Review of Systems  Constitutional:  Positive for appetite change and fatigue.  Unless otherwise noted, a complete review of systems is negative.  Physical Exam General: NAD, ambulatory with cane, fatigued  Cardiovascular:  regular rate and rhythm Pulmonary: normal breathing pattern  Extremities: no edema, no joint deformities Skin: no rashes Neurological: Alert and oriented x3  Discussed the use of AI scribe software for clinical note transcription with the patient, who gave verbal consent to proceed.  History of Present Illness Sumayah Bearse is an 83 year old female with metastatic breast cancer with sclerotic lesion who presents to clinic for initial palliative visit. She is accompanied by her husband, Jerilynn who is her primary caregiver. Their daughter is engaged during the visit on Face-Time. Patient is alert and able to engage appropriately in discussions.  It is a pleasure to see Mr. Wickey who we both recall myself providing care years prior at [facility] as he is also a patient of Dr. Timmy.   I introduced myself, Maygan RN, and Palliative's role in collaboration with the oncology team. Concept of Palliative Care was introduced as specialized medical care for people and their families living with serious illness.  It focuses on providing relief from the symptoms and stress of a serious illness.  The goal is to improve quality of life for both the patient and the family. Values and goals of care important to patient and family were attempted to be elicited.  Mrs. Stenseth lives in the home with her husband of more than 60 years. They have 3 children, 12 grandchildren, and 9 great-grandchildren. She is a retired Catering Manager. She is from [place] and moved to  more than 15 years ago.   At home patient is able to perform most ADLs independently. She is ambulatory with a cane and has a walker if mobility is more challenged specifically days she does not feel well.   She has sleep apnea managed with an Inspire device, which has significantly improved her sleep, allowing her to sleep almost uninterrupted for 13 hours. Despite improved sleep, she reports low energy levels and a sedentary lifestyle,  spending most of her time sitting in a chair.   Her appetite was previously poor but has improved significantly with Megace  twice daily, allowing her to eat three meals a day. Her weight increased from 152 lbs to 160 lbs. Back in October she weighed in at almost 188lbs. She does not regularly use nutritional supplements like Ensure or Boost, but they are available at home.  Denies concerns with nausea or vomiting. Recent colonoscopy.   Jalessa denies constant patin but does endorse occasional right side/flank pain from previous surgery. She experiences knee pain at times and has artificial knees s/p replacements.  Daughter expresses concerns regarding occasional signs of depression where patient will get in a funk causing her to not want to eat spending most of her time in bed. We discussed importance of ongoing emotional support and navigating rough days. I discussed having social work for additional support in addition to myself. They verbalized understanding and appreciation.   At this time patient and family feels as though overall health state has improved. No unmanaged symptoms at this time. They are aware we will continue to closely monitor.   Goals of Care  We discussed Mrs. Shreeve's current illness and what it means in the larger context of her on-going co-morbidities. Natural disease trajectory and expectations were discussed.  Patient and family are realistic in their understanding of most likely metastatic cancer. They are awaiting next steps to include PET scan results. Patient reports previously seeing Dr. Layla and unfortunately was loss to  follow-up seemingly doing well.   They are clear in expressed wishes to treat the treatable allowing her every opportunity to thrive. We discussed goals of care, advanced directives, and code status. Patient remains a full code with full scope care. She has advanced directives on file identify her husband and children as medical decision  makers. She desires no artificial feedings long-term and no life-sustaining measures in the event of terminal/incurable/irreversible causes as her quality of life is most important to her.   I discussed the importance of continued conversation with family and their medical providers regarding overall plan of care and treatment options, ensuring decisions are within the context of the patients values and GOCs. Assessment & Plan Established therapeutic relationship. Education provided on palliative's role in collaboration with their Oncology/Radiation team.  Metastatic breast cancer Recent PET scan performed. Awaiting results. Previously seen by Dr. Layla now with Dr. Timmy. No current symptoms requiring adjustment in management. - Await PET scan results and follow-up with Dr. Timmy for next steps  Cancer-related fatigue and functional decline Significant fatigue and functional decline, likely multifactorial including inactivity, pain, and depression. Low energy levels and sedentary lifestyle contribute to fatigue. - Encouraged increased physical activity, including chair exercises and chair yoga - Set up a daily exercise schedule, even if only 15 minutes a day  Cancer-related anorexia and weight loss Anorexia and weight loss improved with Megace . Currently eating three meals a day and has gained weight from 152 lbs to 160 lbs. No nausea or vomiting reported. - Continue Megace  twice daily as prescribed.  - Monitor weight and nutritional intake  Chronic pain due to knee osteoarthritis and muscle atrophy Chronic knee pain due to osteoarthritis and muscle atrophy. Artificial knees are functioning well. Pain contributes to sedentary lifestyle. - Encouraged physical therapy exercises to improve muscle strength and reduce pain  Obstructive sleep apnea Managed with Inspire device. Reports improved sleep quality with minimal daytime naps. - Continue use of Inspire device  Depression Managed  with Cymbalta . No current symptoms of depression reported. Emotional support needed during periods of low mood. - Continue Cymbalta  as prescribed - Encouraged contact with social workers for emotional support - Warehouse Manager for communication during emotional distress  Goals of Care Patient and family realistic in their understanding of current cancer state and pending results to determine next steps. Her quality of life is most important. They are clear in wishes with goals to treat the treatable allowing Mrs. Cathi every opportunity to continue thriving.  -Advanced Directives on file and reviewed.  -No long-term artificial feedings.  -No life sustaining measures in the setting of incurable/terminal disease, no meaningful recovery.   Follow-up I will plan to see patient back in 2 weeks for close follow-up. Sooner if needed.   Patient expressed understanding and was in agreement with this plan. She also understands that She can call the clinic at any time with any questions, concerns, or complaints.   Thank you for your referral and allowing Palliative to assist in Mrs. Meigan Pates care.   Number and complexity of problems addressed: HIGH - 1 or more chronic illnesses with SEVERE exacerbation, progression, or side effects of treatment - advanced cancer, pain. Any controlled substances utilized were prescribed in the context of palliative care.  I personally spent a total of 70 minutes in the care of the patient today including preparing to see the patient, getting/reviewing separately obtained history, performing a medically appropriate exam/evaluation, counseling and educating, placing orders, referring and communicating with other  health care professionals, documenting clinical information in the EHR, independently interpreting results, and coordinating care. Visit consisted of counseling and education dealing with the complex and emotionally intense issues of symptom management and  palliative care in the setting of serious and potentially life-threatening illness.  Signed by: Levon Borer, AGPCNP-BC Palliative Medicine Team/ Cancer Center    "

## 2024-11-15 ENCOUNTER — Encounter (HOSPITAL_COMMUNITY): Payer: Self-pay | Admitting: Gastroenterology

## 2024-11-16 ENCOUNTER — Encounter: Payer: Self-pay | Admitting: Nurse Practitioner

## 2024-11-16 ENCOUNTER — Ambulatory Visit: Admitting: Nurse Practitioner

## 2024-11-16 VITALS — BP 129/83 | HR 80 | Temp 97.9°F | Ht 59.0 in | Wt 164.8 lb

## 2024-11-16 DIAGNOSIS — G4733 Obstructive sleep apnea (adult) (pediatric): Secondary | ICD-10-CM

## 2024-11-16 NOTE — Patient Instructions (Addendum)
 Continue using your Inspire device nightly and with naps. I adjusted how long your [name] will run for so you will need to turn it off in the morning when you wake up.   You will step up your level by 1 level every 2 weeks until you are seen back or if things are uncomfortable. The next time you will increase your level is 11/16 to level 5. If you still have pain/discomfort, go back down by one level and stay there.   Inspire titration study ordered    Follow up 2/23 at 315 with Dr. Theodoro. If symptoms do not improve or worsen, please contact office for sooner follow up

## 2024-11-16 NOTE — Progress Notes (Signed)
 "  @Patient  ID: Jean Nash, female    DOB: Jul 08, 1941, 84 y.o.   MRN: [id number]  Chief Complaint  Patient presents with   Medical Management of Chronic Issues    Inspire f/u    Referring provider: Merna Huxley, NP  HPI: 84 year old female, former smoker followed for OSA on Inspire and pulmonary fibrosis. She is a patient of Dr. Saundra and last seen in office 10/05/2024 by [facility] NP. Past medical history significant for migraines, hypothyroid, GERD, IBS, fibromyalgia, DDD, prediabetes, depression, breast cancer, insomnia, HLD.   TEST/EVENTS:  03/06/2015 HST: AHI 44.9/h, spO2 low 74%  08/30/2024: OV with Dr. Neda. Severe OSA. Intolerant of CPAP. Inspire device implantation 06/29/2024. Following device implantation, experienced significant tongue discomfort, difficulty moving tongue. Symptoms have improved; back to use. Sleep habits poorly general. Sometimes takes a 5 hour nap during the day. Not very active. Cared for by her husband. Device activated. 1.5-2.5 V. Set at level 1. Delay 30 minutes, duration 9 hours, pause 15.   10/05/2024: Ov with Jean Balfour NP Patient presents today with her husband for follow up after Inspire activation. There was some confusion regarding stepping up therapy so she is still on level 1. She seems to be a little unsure on how the device works and what it is supposed to feel like. She woke up from a nap the other day and couldn't talk due to the Price running. Her husband is the person who manages her remote, and turns her Inspire off and on. He didn't realize he should be increasing her levels. Despite no adjustments, she does seem to be sleeping better he thinks. Her snoring has reduced and is lighter than it used to be. No witnessed apneas anymore. No pain/discomfort after use. Appears the device is off in the mornings when she wakes up. Jean Nash is running for an average of 9 hr 42 min based on download between naps and night time sleep. He does tend to turn it on  before they go to sleep at night.       11/16/2024: Today - follow up Patient presents today with her husband for follow up. He has been adjusting her Inspire levels since her last office visit. She is up to level 3. They have not been increasing it every week due to her not particularly liking the sensation, but she denies any pain. Her husband stopped notifying her when he would increase the level and says that she has been tolerating it well since then. They both note that she is sleeping more soundly. Snoring has mostly resolved. She's getting better numbers from their Sleep Number bed. She does not drive. She does still have daytime fatigue, but she was also just diagnosed with metastatic breast cancer by oncology so believes it may be related to this. She's not currently undergoing any treatment but has plans to meet with oncology again.   Her husband is turning the device off in the morning.   10/17/2024-11/15/2024: Inspire Download Range 1.5-2.5, current level 3 (1.7) 30/30 days; 93% >4 hr; 10 hr 4 min Avg pause 0.1  No Known Allergies  Immunization History  Administered Date(s) Administered   Fluad Quad(high Dose 65+) 07/13/2019, 10/01/2020, 07/31/2021, 08/12/2022   INFLUENZA, HIGH DOSE SEASONAL PF 08/03/2014, 08/08/2015, 08/23/2016, 07/14/2017, 07/20/2018, 08/12/2022, 10/04/2023, 09/27/2024, 10/02/2024   Influenza Split 07/27/2012   Influenza Whole 09/13/2007, 08/08/2008, 07/30/2009, 08/25/2010   Influenza,inj,Quad PF,6+ Mos 07/25/2013   Novel Infuenza-h1n1-09 10/07/2008   PFIZER Comirnaty(Gray Top)Covid-19 Tri-Sucrose Vaccine 02/18/2021, 09/07/2021,  08/12/2022   PFIZER(Purple Top)SARS-COV-2 Vaccination 12/23/2019, 01/17/2020, 08/18/2020   Pfizer(Comirnaty)Fall Seasonal Vaccine 12 years and older 01/26/2023   Pneumococcal Conjugate-13 08/16/2013   Pneumococcal Polysaccharide-23 06/18/2008   RSV,unspecified 08/29/2022   Tdap 11/29/2012   Zoster Recombinant(Shingrix) 04/11/2018,  09/22/2018   Zoster, Live 08/05/2011    Past Medical History:  Diagnosis Date   Adenomatous colon polyp    Anxiety    PHOBIAS   Arthritis    Breast cancer (HCC) 08/08/2013   right LOQ   Cataract    Chronic insomnia    Cluster headaches    history of migraines / NONE FOR SEVERAL YRS   Depression    Diabetes mellitus without complication (HCC)    Diverticulosis    Dyspnea    Fatty liver 2011   Fibromyalgia    GERD (gastroesophageal reflux disease)    H/O hiatal hernia    History of kidney stones    History of transfusion 08/30/2013   Hx of radiation therapy 10/29/13- 12/14/13   right chest wall 5040 cGy 28 sessions, right supraclavicular/axillary region 5040 cGy 28 sessions, right chest wall boost 1000 cGy 5 sessions   Hypothyroidism    Internal hemorrhoids    Irritable bowel syndrome    Lymphedema    RT ARM - WEARS SLEEVE   Macular degeneration    hole/right eye   MDD (major depressive disorder)    Osteopenia    Osteopenia    Other abnormal glucose    Other and unspecified hyperlipidemia    Pain in joint, shoulder region    Pneumonia 8032,7990   Sleep apnea    does not use CPAP   Stress incontinence, female     Tobacco History: Social History   Tobacco Use  Smoking Status Former   Current packs/day: 0.00   Average packs/day: 0.1 packs/day for 2.0 years (0.2 ttl pk-yrs)   Types: Cigarettes   Start date: 11/16/1959   Quit date: 11/15/1960   Years since quitting: 64.0   Passive exposure: Past (over 58 years ago)  Smokeless Tobacco Never   Counseling given: Not Answered   Outpatient Medications Prior to Visit  Medication Sig Dispense Refill   acidophilus (RISAQUAD) CAPS capsule Take 1 capsule by mouth daily.     chlorproMAZINE (THORAZINE) 25 MG tablet Take 12.5-25 mg by mouth 3 (three) times daily as needed (headache).     Cholecalciferol (VITAMIN D ) 50 MCG (2000 UT) tablet Take 2,000 Units by mouth daily.     DULoxetine  (CYMBALTA ) 30 MG capsule Take 1  capsule (30 mg total) by mouth every evening. 90 capsule 1   esomeprazole  (NEXIUM ) 40 MG capsule TAKE 1 CAPSULE BY MOUTH DAILY AT NOON (Patient taking differently: Take 40 mg by mouth in the morning.) 365 capsule 0   levothyroxine  (SYNTHROID ) 100 MCG tablet TAKE 1 TABLET BY MOUTH DAILY 365 tablet 0   megestrol  (MEGACE ) 400 MG/10ML suspension Take 10 mLs (400 mg total) by mouth 2 (two) times daily. 500 mL 3   Multiple Vitamins-Minerals (PRESERVISION AREDS 2 PO) Take 1 capsule by mouth in the morning and at bedtime.     omega-3 acid ethyl esters (LOVAZA ) 1 g capsule TAKE 1 CAPSULE BY MOUTH TWICE A DAY 180 capsule 3   Rimegepant Sulfate (NURTEC) 75 MG TBDP DISSOLVE 1 TABLET BY MOUTH DAILY AS NEEDED FOR ACUTE MIGRAINE ATTACKS 8 tablet 3   zonisamide (ZONEGRAN) 50 MG capsule Take 150 mg by mouth daily with supper.     acetaminophen  (TYLENOL ) 500 MG  tablet Take 1 tablet (500 mg total) by mouth every 6 (six) hours. (Patient not taking: Reported on 11/16/2024) 30 tablet 0   Blood Glucose Monitoring Suppl DEVI 1 each by Does not apply route in the morning, at noon, and at bedtime. May substitute to any manufacturer covered by patient's insurance. (Patient not taking: Reported on 11/16/2024) 1 each 0   hyoscyamine  (LEVSIN  SL) 0.125 MG SL tablet Place 1 tablet (0.125 mg total) under the tongue 2 (two) times daily. (Patient not taking: Reported on 11/16/2024) 30 tablet 6   metFORMIN  (GLUCOPHAGE -XR) 500 MG 24 hr tablet TAKE 1 TABLET BY MOUTH DAILY WITH BREAKFAST (Patient not taking: Reported on 11/16/2024) 90 tablet 0   Na Sulfate-K Sulfate-Mg Sulfate concentrate (SUPREP BOWEL PREP KIT) 17.5-3.13-1.6 GM/177ML SOLN Take 1 kit (354 mLs total) by mouth as directed. For colonoscopy prep (Patient not taking: Reported on 11/16/2024) 354 mL 0   oxyCODONE  (ROXICODONE ) 5 MG immediate release tablet Take 1 tablet (5 mg total) by mouth every 6 (six) hours as needed for severe pain (pain score 7-10) or breakthrough pain. Crush and take  with yogurt if painful to swallow pills (Patient not taking: Reported on 11/16/2024) 25 tablet 0   No facility-administered medications prior to visit.     Review of Systems: as above    Physical Exam:  BP 129/83   Pulse 80   Temp 97.9 F (36.6 C)   Ht 4' 11 (1.499 m) Comment: Per pt  Wt 164 lb 12.8 oz (74.8 kg)   SpO2 94% Comment: RA  BMI 33.29 kg/m   GEN: Pleasant, interactive, well-kempt; obese; in no acute distress HEENT:  Normocephalic and atraumatic. PERRLA. Sclera white. Nasal turbinates pink, moist and patent bilaterally. No rhinorrhea present. Oropharynx pink and moist, without exudate or edema. No lesions, ulcerations, or postnasal drip. Mallampati III NECK:  Supple w/ fair ROM. No lymphadenopathy.   CV: RRR, no m/r/g PULMONARY:  Unlabored, regular breathing. Clear bilaterally A&P w/o wheezes/rales/rhonchi. No accessory muscle use.  GI: BS present and normoactive. Soft, non-tender to palpation.  MSK: No erythema, warmth or tenderness.  Neuro: A/Ox3. No focal deficits noted.   Skin: Warm, no lesions or rashe Psych: Normal affect and behavior. Judgement and thought content appropriate.     Lab Results:  CBC    Component Value Date/Time   WBC 9.6 10/24/2024 1240   WBC 10.6 (H) 09/26/2024 1650   RBC 4.57 10/24/2024 1240   HGB 13.1 10/24/2024 1240   HGB 14.1 09/01/2017 1127   HCT 40.1 10/24/2024 1240   HCT 42.1 09/01/2017 1127   PLT 316 10/24/2024 1240   PLT 152 09/01/2017 1127   MCV 87.7 10/24/2024 1240   MCV 90.3 09/01/2017 1127   MCH 28.7 10/24/2024 1240   MCHC 32.7 10/24/2024 1240   RDW 14.3 10/24/2024 1240   RDW 14.0 09/01/2017 1127   LYMPHSABS 2.1 10/24/2024 1240   LYMPHSABS 1.8 09/01/2017 1127   MONOABS 0.7 10/24/2024 1240   MONOABS 0.5 09/01/2017 1127   EOSABS 0.2 10/24/2024 1240   EOSABS 0.1 09/01/2017 1127   BASOSABS 0.0 10/24/2024 1240   BASOSABS 0.0 09/01/2017 1127    BMET    Component Value Date/Time   NA 138 10/24/2024 1240   NA  140 09/01/2017 1127   K 3.9 10/24/2024 1240   K 4.1 09/01/2017 1127   CL 101 10/24/2024 1240   CO2 23 10/24/2024 1240   CO2 24 09/01/2017 1127   GLUCOSE 153 (H) 10/24/2024 1240  GLUCOSE 132 09/01/2017 1127   BUN 8 10/24/2024 1240   BUN 15.8 09/01/2017 1127   CREATININE 0.67 10/24/2024 1240   CREATININE 0.85 09/12/2020 1349   CREATININE 0.8 09/01/2017 1127   CALCIUM  9.9 10/24/2024 1240   CALCIUM  8.7 09/01/2017 1127   GFRNONAA >60 10/24/2024 1240   GFRNONAA 66 09/12/2020 1349   GFRAA 77 09/12/2020 1349    BNP No results found for: BNP   Imaging:  NM PET Image Initial (PI) Skull Base To Thigh Result Date: 11/12/2024 CLINICAL DATA:  Initial treatment strategy for remote history of breast cancer, bone lesions. EXAM: NUCLEAR MEDICINE PET SKULL BASE TO THIGH TECHNIQUE: 7.8 mCi F-18 FDG was injected intravenously. Full-ring PET imaging was performed from the skull base to thigh after the radiotracer. CT data was obtained and used for attenuation correction and anatomic localization. Fasting blood glucose: 141 mg/dl COMPARISON:  CT chest abdomen pelvis 10/10/2024. FINDINGS: Mediastinal blood pool activity: SUV max 2.4 Liver activity: SUV max NA NECK: No abnormal hypermetabolism. Incidental CT findings: Inspire device in the right neck CHEST: No abnormal hypermetabolism. Incidental CT findings: Atherosclerotic calcification of the aorta, aortic valve and coronary arteries. Heart is enlarged. No pericardial or pleural effusion. Subpleural radiation scarring in the anterior right lung. ABDOMEN/PELVIS: No abnormal hypermetabolism. Incidental CT findings: Cholecystectomy. Tiny angiomyolipoma in the right kidney. Low-attenuation lesion in the left kidney. No specific follow-up necessary. Tiny left renal stone. Proximal gastric wall thickening. Rectal prolapse or rectocele. SKELETON: Patchy hypermetabolism throughout the visualized osseous structures associated with diffuse sclerotic lesions. Index  hypermetabolism in the right sacrum, SUV max 6.1. Incidental CT findings: Degenerative changes in the spine. IMPRESSION: 1. Widespread osseous metastatic disease, likely breast cancer in origin. 2. Tiny left renal stone. 3. Proximal gastric wall thickening. 4. Aortic atherosclerosis (ICD10-I70.0). Coronary artery calcification. Electronically Signed   By: Newell Eke M.D.   On: 11/12/2024 14:00   XR Knee 1-2 Views Right Result Date: 10/19/2024 AP lateral radiographs right knee reviewed.  Partial knee replacement is noted with no evidence of component loosening or lucencies at the bone cement interface.  Alignment is mild varus.  Patellofemoral arthritis is present.  Lateral compartment has minimal degenerative changes and joint space narrowing present  XR Knee 1-2 Views Left Result Date: 10/19/2024 AP lateral radiographs left knee reviewed.  Cemented total knee prosthesis in good position and alignment with no complicating features.  No evidence of loosening and no evidence of lucencies around the bone cement interface in any of the 3 components.   Administration History     None           No data to display          No results found for: NITRICOXIDE      Assessment & Plan:   OSA (obstructive sleep apnea) OSA, intolerant of CPAP. On Inspire. Prior confusion surrounding therapy and increasing levels, which they have increased by 1 level since our last appt but nothing further. She has some difficulties with the sensation and her tongue pulling, which we discussed is normal for this type of therapy. No discomfort/pain. Comfortable protrusion. Re-iterated step up dates and scheduling. Will push her to a two week step up schedule to allow for more time for adjusting to each sensation level. Increased to level 4 today. Re-educated on remote use. Close attention on follow up. She does seem to be receiving benefit from use. She does not drive. Healthy weight management encouraged.  Inspire titration study ordered; will reassess  readiness at follow up.   Hypoglossal nerve stimulator was assessed today.  Goal of the follow-up visit was to ensure good attendance, good subjective benefit, good tongue motion and good sense lead waveforms .incision sites appear good, tongue protrusion was examined.  Download was reviewed.   She denies any discomfort.  Device is set to conventional configuration+/-/+ Programming :  1.  Stimulation level : functional level 1.5 V, 1.5 V lower limit, upper limit 2.5 V.  She was set at level 4 2.  Start delay 30 minutes, pause time 15 minutes, duration 10 hours 3.  Sensing waveform was analyzed for 3 minutes 4.  Sleep remote education was provided patient demonstrated competency with the remote and was aware of patient Instruction videos and sleep remote guide 5.  Patient was instructed to step up levels by 1 level (0.1 V ) every 2 weeks 6.  Inspire titration sleep study will be scheduled; readiness assessed at follow up  Patient Instructions  Continue using your Inspire device nightly and with naps. I adjusted how long your Jean Nash will run for so you will need to turn it off in the morning when you wake up.   You will step up your level by 1 level every 2 weeks until you are seen back or if things are uncomfortable. The next time you will increase your level is 11/16 to level 5. If you still have pain/discomfort, go back down by one level and stay there.   Inspire titration study ordered    Follow up 2/23 at 315 with Dr. Theodoro. If symptoms do not improve or worsen, please contact office for sooner follow up    Advised if symptoms do not improve or worsen, to please contact office for sooner follow up or seek emergency care.   I spent 35 minutes of dedicated to the care of this patient on the date of this encounter to include pre-visit review of records, face-to-face time with the patient discussing conditions above, post visit ordering of  testing, clinical documentation with the electronic health record, making appropriate referrals as documented, and communicating necessary findings to members of the patients care team.  Comer LULLA Rouleau, NP 11/16/2024  Pt aware and understands NP's role.   "

## 2024-11-16 NOTE — Assessment & Plan Note (Signed)
 OSA, intolerant of CPAP. On Inspire. Prior confusion surrounding therapy and increasing levels, which they have increased by 1 level since our last appt but nothing further. She has some difficulties with the sensation and her tongue pulling, which we discussed is normal for this type of therapy. No discomfort/pain. Comfortable protrusion. Re-iterated step up dates and scheduling. Will push her to a two week step up schedule to allow for more time for adjusting to each sensation level. Increased to level 4 today. Re-educated on remote use. Close attention on follow up. She does seem to be receiving benefit from use. She does not drive. Healthy weight management encouraged. Inspire titration study ordered; will reassess readiness at follow up.   Hypoglossal nerve stimulator was assessed today.  Goal of the follow-up visit was to ensure good attendance, good subjective benefit, good tongue motion and good sense lead waveforms .incision sites appear good, tongue protrusion was examined.  Download was reviewed.   She denies any discomfort.  Device is set to conventional configuration+/-/+ Programming :  1.  Stimulation level : functional level 1.5 V, 1.5 V lower limit, upper limit 2.5 V.  She was set at level 4 2.  Start delay 30 minutes, pause time 15 minutes, duration 10 hours 3.  Sensing waveform was analyzed for 3 minutes 4.  Sleep remote education was provided patient demonstrated competency with the remote and was aware of patient Instruction videos and sleep remote guide 5.  Patient was instructed to step up levels by 1 level (0.1 V ) every 2 weeks 6.  Inspire titration sleep study will be scheduled; readiness assessed at follow up  Patient Instructions  Continue using your Inspire device nightly and with naps. I adjusted how long your [name] will run for so you will need to turn it off in the morning when you wake up.   You will step up your level by 1 level every 2 weeks until you are seen back  or if things are uncomfortable. The next time you will increase your level is 11/16 to level 5. If you still have pain/discomfort, go back down by one level and stay there.   Inspire titration study ordered    Follow up 2/23 at 315 with Dr. Theodoro. If symptoms do not improve or worsen, please contact office for sooner follow up

## 2024-11-18 ENCOUNTER — Other Ambulatory Visit: Payer: Self-pay | Admitting: Adult Health

## 2024-11-18 DIAGNOSIS — Z760 Encounter for issue of repeat prescription: Secondary | ICD-10-CM

## 2024-11-19 LAB — SURGICAL PATHOLOGY

## 2024-11-20 ENCOUNTER — Ambulatory Visit: Payer: Self-pay | Admitting: Gastroenterology

## 2024-11-21 ENCOUNTER — Encounter: Payer: Self-pay | Admitting: Nurse Practitioner

## 2024-11-21 ENCOUNTER — Telehealth: Payer: Self-pay

## 2024-11-21 ENCOUNTER — Other Ambulatory Visit (HOSPITAL_COMMUNITY): Payer: Self-pay

## 2024-11-21 NOTE — Telephone Encounter (Signed)
 Pharmacy Patient Advocate Encounter   Received notification from [facility] KEY that prior authorization for Lovaza  1gm caps is required/requested.   Insurance verification completed.   The patient is insured through [employer] [facility].   Per test claim: PA required; PA submitted to above mentioned insurance via Latent Key/confirmation #/EOC [id number] Status is pending

## 2024-11-21 NOTE — Telephone Encounter (Signed)
 Pharmacy Patient Advocate Encounter  Received notification from [employer] [facility] that Prior Authorization for Lovaza  1 gm has been DENIED.  Full denial letter will be uploaded to the media tab. See denial reason below.    PA #/Case ID/Reference #: # [id number]

## 2024-11-22 ENCOUNTER — Telehealth: Payer: Self-pay

## 2024-11-22 ENCOUNTER — Inpatient Hospital Stay: Attending: Hematology & Oncology | Admitting: Licensed Clinical Social Worker

## 2024-11-22 DIAGNOSIS — C50919 Malignant neoplasm of unspecified site of unspecified female breast: Secondary | ICD-10-CM | POA: Insufficient documentation

## 2024-11-22 DIAGNOSIS — C50511 Malignant neoplasm of lower-outer quadrant of right female breast: Secondary | ICD-10-CM

## 2024-11-22 DIAGNOSIS — Z5111 Encounter for antineoplastic chemotherapy: Secondary | ICD-10-CM | POA: Insufficient documentation

## 2024-11-22 DIAGNOSIS — Z79620 Long term (current) use of immunosuppressive biologic: Secondary | ICD-10-CM | POA: Insufficient documentation

## 2024-11-22 DIAGNOSIS — Z1721 Progesterone receptor positive status: Secondary | ICD-10-CM | POA: Insufficient documentation

## 2024-11-22 DIAGNOSIS — Z170 Estrogen receptor positive status [ER+]: Secondary | ICD-10-CM | POA: Insufficient documentation

## 2024-11-22 DIAGNOSIS — C7951 Secondary malignant neoplasm of bone: Secondary | ICD-10-CM | POA: Insufficient documentation

## 2024-11-22 DIAGNOSIS — Z1732 Human epidermal growth factor receptor 2 negative status: Secondary | ICD-10-CM | POA: Insufficient documentation

## 2024-11-22 NOTE — Telephone Encounter (Signed)
 Copied from CRM #[id number]. Topic: Clinical - Medical Advice >> Nov 22, 2024 12:17 PM Devaughn RAMAN wrote: Reason for CRM: Pt's husband Jerilynn called to advise pt would like her physical inspire removed and would like to f/u regarding this. Please f/u with pt regarding this.   Lawrence-[phone]    Pt husband states she has been screaming in pain with her tongue and mouth hurting and demanding that she wants it out. This has happened since she was bumped to level 3 but now that it is at level 5, it is worse. Pt's husband states it needs to come out or be adjusted.   Husband stated pt has a death wish, as in does not want to eat, sleep, talk or do anything. Pt husband states she only stares at a wall. Pt husband states he is going to keep her at level 2 until she has an appt. Pt's appt is on 12-07-2024 with Dr Theodoro and I could not find anything sooner. I informed pt's husband that if any new problems arrive, Let us  know.   Routing to Holden to also advise

## 2024-11-22 NOTE — Telephone Encounter (Signed)
 I called and spoke to pt's husband. DPR. Pt husband informed of Jean Nash's note and stated he will not put pt on Level 4. He stated he will keep her at level 3 but he is not bumping up the levels. Pt husband states he will keep upcoming appt and said goodnight NFN

## 2024-11-22 NOTE — Telephone Encounter (Signed)
 She should not be on level 5, which is likely the issue. At her last OV on 1/2, I increased her to level 4 and told them to remain there for 2 weeks before making any further increases. Instructions You will step up your level by 1 level every 2 weeks until you are seen back or if things are uncomfortable. It has not even been a week since she was increased to the level 4. Increasing levels too quickly can cause pain and discomfort with use. She needs to go back to level 4, remain there for 2 weeks, then they can try to increase to level 5 again. The device should be running once she is asleep. She will only receive the initial impulse, which is stronger than when it is moving the tongue throughout the night, and then she will feel her tongue motion at night if she wakes up, for which they can use the pause option to help manage comfort. Device should be off if she is otherwise awake. If it is still uncomfortable, at level 5, they can return to level 4 and stay there until her follow up visit.  If she still wants to d/c the Excello, she needs a visit to discuss this. Thanks.

## 2024-11-22 NOTE — Telephone Encounter (Signed)
 Pt spouse notified of update. He stated they will be in tomorrow and can discuss other treatment options.

## 2024-11-22 NOTE — Telephone Encounter (Signed)
 Patient will need a inspire visit with Dr.Olalere,was advised to send over to [facility] as we are unaware who sets up inspire visits

## 2024-11-22 NOTE — Progress Notes (Signed)
 [facility] CSW Progress Note  Clinical Child Psychotherapist contacted pt and spouse by phone to follow-up on scheduled counseling visit.    Interventions: Pt was scheduled for a video counseling session at 10:00.  When pt had not joined the session at 10:20 CSW contacted pt by phone.  Pt's husband reports he and his wife have had an extremely difficult year and at this point have both decided they no longer wish to pursue any medical treatment.  Pt's spouse states he is overwhelmed and is unable to provide the care he and his wife need and will be cancelling all medical appointments and no longer wishes for any follow up for himself or his spouse.  CSW inquired if there is anyone who could be called on their behalf to offer some support at this time.  Pt's spouse declined offer.  CSW then spoke w/ pt who verbalized agreement w/ what her husband had said.  CSW asked if the couple would be receptive to a follow up call in a couple of days from CSW to check in as CSW would like to provide whatever support they may benefit from to which both pt and her husband verbalized agreement.  Palliative care team informed of the above.        Follow Up Plan:  CSW will follow-up with patient by phone     Devere JONELLE Manna, LCSW Clinical Social Worker [facility]

## 2024-11-23 ENCOUNTER — Encounter: Payer: Self-pay | Admitting: Adult Health

## 2024-11-23 ENCOUNTER — Ambulatory Visit: Admitting: Adult Health

## 2024-11-23 VITALS — BP 110/60 | HR 91 | Temp 98.3°F | Ht 59.0 in | Wt 161.0 lb

## 2024-11-23 DIAGNOSIS — R2681 Unsteadiness on feet: Secondary | ICD-10-CM | POA: Diagnosis not present

## 2024-11-23 DIAGNOSIS — C50511 Malignant neoplasm of lower-outer quadrant of right female breast: Secondary | ICD-10-CM | POA: Diagnosis not present

## 2024-11-23 DIAGNOSIS — R5381 Other malaise: Secondary | ICD-10-CM

## 2024-11-23 DIAGNOSIS — F330 Major depressive disorder, recurrent, mild: Secondary | ICD-10-CM

## 2024-11-23 DIAGNOSIS — R5382 Chronic fatigue, unspecified: Secondary | ICD-10-CM

## 2024-11-23 DIAGNOSIS — Z170 Estrogen receptor positive status [ER+]: Secondary | ICD-10-CM | POA: Diagnosis not present

## 2024-11-23 NOTE — Progress Notes (Signed)
 "  Subjective:    [name], female    DOB: Nov 24, 1940, 84 y.o.   MRN: [id number]  HPI Discussed the use of AI scribe software for clinical note transcription with the patient, who gave verbal consent to proceed.  History of Present Illness   Jean Nash is an 84 year old female with metastatic breast cancer who presents with concerns about her current treatment and physical functionality.  She has metastatic breast cancer with recent bone involvement on imaging, significant fatigue, and is described by family as unable to stand or walk reliably. She is eating better since starting Megace  that has improved her appetite.   Her activity is largely limited to sitting, watching TV, and playing on her phone. She has been inactive for quite some time. She did home health PT about six months ago and had noticable improvement but stopped doing the exercises. She wants to restart physical therapy at home due to poor mobility and fatigue.    She does have a history of depression and takes Cymbalta  prescribed by her psychiatrist. She does not feel as though the depression has become any worse.        Review of Systems See HPI   Past Medical History:  Diagnosis Date   Adenomatous colon polyp    Anxiety    PHOBIAS   Arthritis    Breast cancer (HCC) 08/08/2013   right LOQ   Cataract    Chronic insomnia    Cluster headaches    history of migraines / NONE FOR SEVERAL YRS   Depression    Diabetes mellitus without complication (HCC)    Diverticulosis    Dyspnea    Fatty liver 2011   Fibromyalgia    GERD (gastroesophageal reflux disease)    H/O hiatal hernia    History of kidney stones    History of transfusion 08/30/2013   Hx of radiation therapy 10/29/13- 12/14/13   right chest wall 5040 cGy 28 sessions, right supraclavicular/axillary region 5040 cGy 28 sessions, right chest wall boost 1000 cGy 5 sessions   Hypothyroidism    Internal hemorrhoids    Irritable bowel  syndrome    Lymphedema    RT ARM - WEARS SLEEVE   Macular degeneration    hole/right eye   MDD (major depressive disorder)    Osteopenia    Osteopenia    Other abnormal glucose    Other and unspecified hyperlipidemia    Pain in joint, shoulder region    Pneumonia 8032,7990   Sleep apnea    does not use CPAP   Stress incontinence, female     Social History   Socioeconomic History   Marital status: Married    Spouse name: Ubaldo   Number of children: 3   Years of education: Not on file   Highest education level: Some college, no degree  Occupational History   Occupation: retired catering manager  Tobacco Use   Smoking status: Former    Current packs/day: 0.00    Average packs/day: 0.1 packs/day for 2.0 years (0.2 ttl pk-yrs)    Types: Cigarettes    Start date: 11/16/1959    Quit date: 11/15/1960    Years since quitting: 64.0    Passive exposure: Past (over 58 years ago)   Smokeless tobacco: Never  Vaping Use   Vaping status: Never Used  Substance and Sexual Activity   Alcohol use: Not Currently   Drug use: No   Sexual activity: Yes    Comment: menarche  age 54, fist live birth 83, P 3, hysterectomy age 27, no HRT, BCP 2 yrs  Other Topics Concern   Not on file  Social History Narrative   Occupation:  Retired catering manager    Married with 3 grown children      Never Smoked     Alcohol use-no        Social Drivers of Health   Tobacco Use: Medium Risk (11/23/2024)   Patient History    Smoking Tobacco Use: Former    Smokeless Tobacco Use: Never    Passive Exposure: Past  Physicist, Medical Strain: Low Risk (09/17/2024)   Overall Financial Resource Strain (CARDIA)    Difficulty of Paying Living Expenses: Not hard at all  Food Insecurity: No Food Insecurity (10/24/2024)   Epic    Worried About Programme Researcher, Broadcasting/film/video in the Last Year: Never true    Ran Out of Food in the Last Year: Never true  Transportation Needs: No Transportation Needs (10/24/2024)   Epic    Lack of  Transportation (Medical): No    Lack of Transportation (Non-Medical): No  Physical Activity: Inactive (09/17/2024)   Exercise Vital Sign    Days of Exercise per Week: 0 days    Minutes of Exercise per Session: Not on file  Stress: Stress Concern Present (06/23/2024)   [employer] of Occupational Health - Occupational Stress Questionnaire    Feeling of Stress: To some extent  Social Connections: Unknown (09/17/2024)   Social Connection and Isolation Panel    Frequency of Communication with Friends and Family: Twice a week    Frequency of Social Gatherings with Friends and Family: Never    Attends Religious Services: Not on file    Active Member of Clubs or Organizations: No    Attends Banker Meetings: Not on file    Marital Status: Married  Intimate Partner Violence: Not At Risk (10/24/2024)   Epic    Fear of Current or Ex-Partner: No    Emotionally Abused: No    Physically Abused: No    Sexually Abused: No  Depression (PHQ2-9): Low Risk (10/24/2024)   Depression (PHQ2-9)    PHQ-2 Score: 0  Recent Concern: Depression (PHQ2-9) - High Risk (09/27/2024)   Depression (PHQ2-9)    PHQ-2 Score: 16  Alcohol Screen: Low Risk (06/23/2024)   Alcohol Screen    Last Alcohol Screening Score (AUDIT): 1  Housing: Low Risk (10/24/2024)   Epic    Unable to Pay for Housing in the Last Year: No    Number of Times Moved in the Last Year: 0    Homeless in the Last Year: No  Utilities: Not At Risk (10/24/2024)   Epic    Threatened with loss of utilities: No  Health Literacy: Not on file    Past Surgical History:  Procedure Laterality Date   ABDOMINAL HYSTERECTOMY     APPENDECTOMY     BILATERAL TOTAL MASTECTOMY WITH AXILLARY LYMPH NODE DISSECTION  08/30/2013   Dr ARON   BIOPSY OF SKIN SUBCUTANEOUS TISSUE AND/OR MUCOUS MEMBRANE  11/13/2024   Procedure: BIOPSY, SKIN, SUBCUTANEOUS TISSUE, OR MUCOUS MEMBRANE;  Surgeon: Wilhelmenia, Aloha Raddle., MD;  Location: [facility];   Service: Gastroenterology;;   BREAST CYST ASPIRATION     9 cysts   CATARACT EXTRACTION, BILATERAL  2005/2007   CHOLECYSTECTOMY     COLONOSCOPY     COLONOSCOPY N/A 11/13/2024   Procedure: COLONOSCOPY;  Surgeon: Wilhelmenia Aloha Raddle., MD;  Location: [facility];  Service: Gastroenterology;  Laterality: N/A;   COLONOSCOPY WITH PROPOFOL  N/A 11/10/2023   Procedure: COLONOSCOPY WITH PROPOFOL ;  Surgeon: Mansouraty, Aloha Raddle., MD;  Location: [facility];  Service: Gastroenterology;  Laterality: N/A;   CYSTOCELE REPAIR     DRUG INDUCED ENDOSCOPY N/A 05/30/2024   Procedure: DRUG INDUCED SLEEP ENDOSCOPY;  Surgeon: Okey Burns, MD;  Location: [facility];  Service: ENT;  Laterality: N/A;   ENDOSCOPIC MUCOSAL RESECTION N/A 11/10/2023   Procedure: ENDOSCOPIC MUCOSAL RESECTION;  Surgeon: Wilhelmenia Aloha Raddle., MD;  Location: [facility];  Service: Gastroenterology;  Laterality: N/A;   ESOPHAGOGASTRODUODENOSCOPY N/A 11/13/2024   Procedure: EGD (ESOPHAGOGASTRODUODENOSCOPY);  Surgeon: Wilhelmenia Aloha Raddle., MD;  Location: [facility];  Service: Gastroenterology;  Laterality: N/A;   EVACUATION BREAST HEMATOMA Left 08/31/2013   Procedure: EVACUATION HEMATOMA BREAST;  Surgeon: Jina Nephew, MD;  Location: [facility];  Service: General;  Laterality: Left;   EYE SURGERY     to repair macular hole   FOOT ARTHROPLASTY     lt    GANGLION CYST EXCISION     rt foot   HEMORRHOID SURGERY     03/1993   HEMOSTASIS CLIP PLACEMENT  11/10/2023   Procedure: HEMOSTASIS CLIP PLACEMENT;  Surgeon: Wilhelmenia Aloha Raddle., MD;  Location: [facility];  Service: Gastroenterology;;   IMPLANTATION OF HYPOGLOSSAL NERVE STIMULATOR N/A 06/29/2024   Procedure: INSERTION, HYPOGLOSSAL NERVE STIMULATOR;  Surgeon: Okey Burns, MD;  Location: [facility];  Service: ENT;  Laterality: N/A;   JOINT REPLACEMENT  03/15/11   left knee replacement   KNEE ARTHROSCOPY     /partial knee 2016/left knee 2012   MASS EXCISION   11/04/2011   Procedure: EXCISION MASS;  Surgeon: Lamar LULLA Leonor Raddle., MD;  Location: [facility];  Service: Orthopedics;  Laterality: Right;  excisional biopsy right ulna mass   MASTECTOMY W/ SENTINEL NODE BIOPSY Right 08/30/2013   Procedure: RIGHT  AXILLARY SENTINEL LYMPH NODE BIOPSY; Right Axillary Node Disection;  Surgeon: Jina Nephew, MD;  Location: [facility];  Service: General;  Laterality: Right;  Right side nuc med 7:00    PARTIAL KNEE ARTHROPLASTY Right 11/03/2015   Procedure: RIGHT KNEE MEDIAL UNICOMPARTMENTAL ARTHROPLASTY ;  Surgeon: Dempsey Moan, MD;  Location: [facility];  Service: Orthopedics;  Laterality: Right;   POLYPECTOMY  11/10/2023   Procedure: POLYPECTOMY;  Surgeon: Mansouraty, Aloha Raddle., MD;  Location: [facility];  Service: Gastroenterology;;   POLYPECTOMY  11/13/2024   Procedure: POLYPECTOMY, INTESTINE;  Surgeon: Wilhelmenia Aloha Raddle., MD;  Location: [facility];  Service: Gastroenterology;;   RECTOCELE REPAIR     SIMPLE MASTECTOMY WITH AXILLARY SENTINEL NODE BIOPSY Left 08/30/2013   Procedure: Bilateral Breast Mastectomy ;  Surgeon: Jina Nephew, MD;  Location: [facility];  Service: General;  Laterality: Left;   skin tags removed     breast, panty line, neckline   SUBMUCOSAL LIFTING INJECTION  11/10/2023   Procedure: SUBMUCOSAL LIFTING INJECTION;  Surgeon: Wilhelmenia Aloha Raddle., MD;  Location: [facility];  Service: Gastroenterology;;   TOE SURGERY     preventative crossover toe surg/right foot   TOE SURGERY  2009   left foot/screw  in 2nd toe   TONSILLECTOMY     UPPER GASTROINTESTINAL ENDOSCOPY      Family History  Problem Relation Age of Onset   Stroke Mother        died age 37   Diabetes Mother    Breast cancer Mother 1   Breast cancer Sister 68   Brain cancer Maternal Uncle 8  Breast cancer Paternal Aunt 39   Breast cancer Paternal Aunt        dx in her 21s   Cancer Maternal Grandmother        intra-abdominal cancer   Diabetes Maternal  Grandfather    Breast cancer Paternal Grandmother 44   Brain cancer Cousin 41       maternal cousin   Brain cancer Cousin 20       paternal cousin   Colon cancer Neg Hx    Colon polyps Neg Hx    Crohn's disease Neg Hx    Esophageal cancer Neg Hx    Rectal cancer Neg Hx    Stomach cancer Neg Hx    Ulcerative colitis Neg Hx    Inflammatory bowel disease Neg Hx    Liver disease Neg Hx    Pancreatic cancer Neg Hx     Allergies[1]  Medications Ordered Prior to Encounter[2]  BP 110/60   Pulse 91   Temp 98.3 F (36.8 C) (Oral)   Ht 4' 11 (1.499 m)   Wt 161 lb (73 kg)   SpO2 97%   BMI 32.52 kg/m       Objective:   Physical Exam Vitals and nursing note reviewed.  Constitutional:      Appearance: Normal appearance.  Cardiovascular:     Rate and Rhythm: Normal rate and regular rhythm.     Pulses: Normal pulses.     Heart sounds: Normal heart sounds.  Pulmonary:     Effort: Pulmonary effort is normal.     Breath sounds: Normal breath sounds.  Skin:    General: Skin is warm and dry.     Capillary Refill: Capillary refill takes less than 2 seconds.  Neurological:     General: No focal deficit present.     Mental Status: She is alert and oriented to person, place, and time.  Psychiatric:        Attention and Perception: Attention and perception normal.        Mood and Affect: Mood normal. Affect is flat.        Speech: Speech is delayed.        Behavior: Behavior normal.        Thought Content: Thought content normal.        Cognition and Memory: Cognition is impaired. Memory is impaired.        Judgment: Judgment normal.           Assessment & Plan:  Assessment and Plan    Metastatic breast cancer with bone involvement Metastatic disease to bone confirmed by PET scan. Awaiting hormone therapy initiation. Appetite improved, energy levels unchanged. No significant depression. - Continue palliative care management. - Awaiting initiation of hormone therapy as  planned by oncologist. - Encouraged physical activity to maintain mobility and reduce fatigue.  Impaired mobility and deconditioning Mobility and deconditioning worsened by cancer and treatment. Previous physical therapy improved stability and mobility. - Ordered home health physical therapy to improve mobility and strength. - Encouraged regular physical activity to prevent further deconditioning.      Novice Vrba, NP  I personally spent a total of 35 minutes in the care of the patient today including preparing to see the patient, getting/reviewing separately obtained history, performing a medically appropriate exam/evaluation, counseling and educating, placing orders, and documenting clinical information in the EHR.       [1] No Known Allergies [2]  Current Outpatient Medications on File Prior to Visit  Medication Sig  Dispense Refill   acidophilus (RISAQUAD) CAPS capsule Take 1 capsule by mouth daily.     chlorproMAZINE (THORAZINE) 25 MG tablet Take 12.5-25 mg by mouth 3 (three) times daily as needed (headache).     Cholecalciferol (VITAMIN D ) 50 MCG (2000 UT) tablet Take 2,000 Units by mouth daily.     DULoxetine  (CYMBALTA ) 30 MG capsule Take 1 capsule (30 mg total) by mouth every evening. 90 capsule 1   esomeprazole  (NEXIUM ) 40 MG capsule TAKE 1 CAPSULE BY MOUTH DAILY AT NOON (Patient taking differently: Take 40 mg by mouth in the morning.) 365 capsule 0   levothyroxine  (SYNTHROID ) 100 MCG tablet TAKE 1 TABLET BY MOUTH DAILY 365 tablet 0   megestrol  (MEGACE ) 400 MG/10ML suspension Take 10 mLs (400 mg total) by mouth 2 (two) times daily. 500 mL 3   Multiple Vitamins-Minerals (PRESERVISION AREDS 2 PO) Take 1 capsule by mouth in the morning and at bedtime.     Rimegepant Sulfate (NURTEC) 75 MG TBDP DISSOLVE 1 TABLET BY MOUTH DAILY AS NEEDED FOR ACUTE MIGRAINE ATTACKS 8 tablet 3   zonisamide (ZONEGRAN) 50 MG capsule Take 150 mg by mouth daily with supper.     acetaminophen  (TYLENOL )  500 MG tablet Take 1 tablet (500 mg total) by mouth every 6 (six) hours. (Patient not taking: Reported on 11/23/2024) 30 tablet 0   Blood Glucose Monitoring Suppl DEVI 1 each by Does not apply route in the morning, at noon, and at bedtime. May substitute to any manufacturer covered by patient's insurance. (Patient not taking: Reported on 11/23/2024) 1 each 0   hyoscyamine  (LEVSIN  SL) 0.125 MG SL tablet Place 1 tablet (0.125 mg total) under the tongue 2 (two) times daily. (Patient not taking: Reported on 11/23/2024) 30 tablet 6   Na Sulfate-K Sulfate-Mg Sulfate concentrate (SUPREP BOWEL PREP KIT) 17.5-3.13-1.6 GM/177ML SOLN Take 1 kit (354 mLs total) by mouth as directed. For colonoscopy prep (Patient not taking: Reported on 11/16/2024) 354 mL 0   omega-3 acid ethyl esters (LOVAZA ) 1 g capsule TAKE 1 CAPSULE BY MOUTH 2 TIMES A DAY 180 capsule 3   oxyCODONE  (ROXICODONE ) 5 MG immediate release tablet Take 1 tablet (5 mg total) by mouth every 6 (six) hours as needed for severe pain (pain score 7-10) or breakthrough pain. Crush and take with yogurt if painful to swallow pills (Patient not taking: Reported on 11/23/2024) 25 tablet 0   No current facility-administered medications on file prior to visit.   "

## 2024-11-26 ENCOUNTER — Other Ambulatory Visit: Payer: Self-pay

## 2024-11-26 ENCOUNTER — Inpatient Hospital Stay: Admitting: Hematology & Oncology

## 2024-11-26 ENCOUNTER — Encounter: Payer: Self-pay | Admitting: Hematology & Oncology

## 2024-11-26 ENCOUNTER — Other Ambulatory Visit: Payer: Self-pay | Admitting: *Deleted

## 2024-11-26 ENCOUNTER — Encounter: Payer: Self-pay | Admitting: *Deleted

## 2024-11-26 ENCOUNTER — Telehealth: Payer: Self-pay

## 2024-11-26 ENCOUNTER — Other Ambulatory Visit (HOSPITAL_COMMUNITY): Payer: Self-pay

## 2024-11-26 ENCOUNTER — Inpatient Hospital Stay

## 2024-11-26 VITALS — BP 121/65 | HR 85 | Temp 98.9°F | Resp 18 | Ht 59.0 in | Wt 164.0 lb

## 2024-11-26 DIAGNOSIS — C7951 Secondary malignant neoplasm of bone: Secondary | ICD-10-CM | POA: Diagnosis present

## 2024-11-26 DIAGNOSIS — C50511 Malignant neoplasm of lower-outer quadrant of right female breast: Secondary | ICD-10-CM

## 2024-11-26 DIAGNOSIS — Z5111 Encounter for antineoplastic chemotherapy: Secondary | ICD-10-CM | POA: Diagnosis present

## 2024-11-26 DIAGNOSIS — Z1732 Human epidermal growth factor receptor 2 negative status: Secondary | ICD-10-CM | POA: Diagnosis not present

## 2024-11-26 DIAGNOSIS — C50919 Malignant neoplasm of unspecified site of unspecified female breast: Secondary | ICD-10-CM | POA: Diagnosis present

## 2024-11-26 DIAGNOSIS — Z170 Estrogen receptor positive status [ER+]: Secondary | ICD-10-CM | POA: Diagnosis not present

## 2024-11-26 DIAGNOSIS — Z79620 Long term (current) use of immunosuppressive biologic: Secondary | ICD-10-CM | POA: Diagnosis not present

## 2024-11-26 DIAGNOSIS — Z1721 Progesterone receptor positive status: Secondary | ICD-10-CM | POA: Diagnosis not present

## 2024-11-26 LAB — CMP (CANCER CENTER ONLY)
ALT: 34 U/L (ref 0–44)
AST: 19 U/L (ref 15–41)
Albumin: 4.4 g/dL (ref 3.5–5.0)
Alkaline Phosphatase: 352 U/L — ABNORMAL HIGH (ref 38–126)
Anion gap: 13 (ref 5–15)
BUN: 28 mg/dL — ABNORMAL HIGH (ref 8–23)
CO2: 20 mmol/L — ABNORMAL LOW (ref 22–32)
Calcium: 9.2 mg/dL (ref 8.9–10.3)
Chloride: 107 mmol/L (ref 98–111)
Creatinine: 0.84 mg/dL (ref 0.44–1.00)
GFR, Estimated: >60 mL/min
Glucose, Bld: 166 mg/dL — ABNORMAL HIGH (ref 70–99)
Potassium: 3.8 mmol/L (ref 3.5–5.1)
Sodium: 140 mmol/L (ref 135–145)
Total Bilirubin: 0.6 mg/dL (ref 0.0–1.2)
Total Protein: 6.8 g/dL (ref 6.5–8.1)

## 2024-11-26 LAB — CBC WITH DIFFERENTIAL (CANCER CENTER ONLY)
Abs Immature Granulocytes: 0.11 K/uL — ABNORMAL HIGH (ref 0.00–0.07)
Basophils Absolute: 0 K/uL (ref 0.0–0.1)
Basophils Relative: 0 %
Eosinophils Absolute: 0.1 K/uL (ref 0.0–0.5)
Eosinophils Relative: 1 %
HCT: 42.6 % (ref 36.0–46.0)
Hemoglobin: 14.1 g/dL (ref 12.0–15.0)
Immature Granulocytes: 1 %
Lymphocytes Relative: 22 %
Lymphs Abs: 2.6 K/uL (ref 0.7–4.0)
MCH: 29.6 pg (ref 26.0–34.0)
MCHC: 33.1 g/dL (ref 30.0–36.0)
MCV: 89.5 fL (ref 80.0–100.0)
Monocytes Absolute: 0.9 K/uL (ref 0.1–1.0)
Monocytes Relative: 7 %
Neutro Abs: 8.1 K/uL — ABNORMAL HIGH (ref 1.7–7.7)
Neutrophils Relative %: 69 %
Platelet Count: 241 K/uL (ref 150–400)
RBC: 4.76 MIL/uL (ref 3.87–5.11)
RDW: 17.6 % — ABNORMAL HIGH (ref 11.5–15.5)
WBC Count: 11.8 K/uL — ABNORMAL HIGH (ref 4.0–10.5)
nRBC: 0 % (ref 0.0–0.2)

## 2024-11-26 LAB — LACTATE DEHYDROGENASE: LDH: 174 U/L (ref 105–235)

## 2024-11-26 LAB — PREALBUMIN: Prealbumin: 29 mg/dL (ref 18–38)

## 2024-11-26 LAB — SAMPLE TO BLOOD BANK

## 2024-11-26 LAB — MAGNESIUM: Magnesium: 2.1 mg/dL (ref 1.7–2.4)

## 2024-11-26 MED ORDER — RIBOCICLIB SUCC (400 MG DOSE) 200 MG PO TBPK
400.0000 mg | ORAL_TABLET | Freq: Every day | ORAL | 6 refills | Status: AC
  Filled 2024-11-27: qty 42, 28d supply, fill #0
  Filled 2024-12-18: qty 42, 28d supply, fill #1

## 2024-11-26 NOTE — Progress Notes (Unsigned)
 Jean Nash

## 2024-11-26 NOTE — Progress Notes (Signed)
 START ON PATHWAY REGIMEN - Breast     Cycle 1: A cycle is 28 days:     Ribociclib       Fulvestrant     Cycles 2 and beyond: A cycle is every 28 days:     Ribociclib       Fulvestrant    **Always confirm dose/schedule in your pharmacy ordering system**  Patient Characteristics: Distant Metastases or Locoregional Recurrent Disease - Unresectable, M0 or Locally Advanced Unresectable Disease Progressing after Neoadjuvant and Local Therapies, M0, HER2 Negative/Ultralow/Low, ER Positive, Endocrine Therapy, Postmenopausal, First  Line, Prior Adjuvant Endocrine Therapy Completed > 12 Months Ago, Candidate for a CDK4/6 Inhibitor, Ribociclib  Preferred Therapeutic Status: Distant Metastases ER Status: Positive (+) PR Status: Positive (+) HER2 Status: Negative (-) Therapy Approach Indicated: Standard Chemotherapy/Endocrine Therapy Menopausal Status: Postmenopausal Line of Therapy: First Line Previous Therapy: Prior Adjuvant Endocrine Therapy Completed > 12 Months Ago Intent of Therapy: Non-Curative / Palliative Intent, Discussed with Patient

## 2024-11-26 NOTE — Progress Notes (Signed)
 " Hematology and Oncology Follow Up Visit  Sharnice Bosler [id number] 01-22-1941 84 y.o. 11/26/2024   Principle Diagnosis:  Metastatic breast cancer-ER+/PR+/HER2- - PIK3CA (+)  Current Therapy:   Faslodex  500 mg IM monthly Ribociclib  400 mg p.o. daily (21 on/7 off) Xgeva  120 mg IM every 3 months     Interim History:  Ms. Koffman is back for her second visit.  She has metastatic breast cancer.  She saw us  initially on 10/24/2024.  She has a past history of early-stage breast cancer back in 2014.  She had a had radiation therapy.  She is on tamoxifen  for 5 years.  Now, she has metastatic disease.  Her CA 27.29 is elevated at 256.  We did do a PET scan on her.  This was done on 11/09/2024.  This showed widespread osseous metastatic disease.  There is no evidence of metastatic disease in the lung, liver, lymph nodes.  She just feels tired.  She does not have a lot of energy.  She is not complaining of any pain.  I feel that we can certainly help her out.  Again, she has estrogen positive disease.  I feel that we should be able to help us  out with antiestrogen therapy.  I think that Faslodex  and ribociclib  would be a very good protocol for her.  Again I think she is in pretty decent shape.  She is not hurting.  She seems to be eating well.  Her albumin  is quite good.  She has had no cough.  No shortness of breath.  She has had no change in bowel or bladder habits.  She has had no bleeding.  There is been no leg swelling.  Currently, I would say that her performance status is probably ECOG 2.  Medications: Current Medications[1]  Allergies: Allergies[2]  Past Medical History, Surgical history, Social history, and Family History were reviewed and updated.  Review of Systems: Review of Systems  Constitutional:  Positive for fatigue.  HENT:  Negative.    Eyes: Negative.   Respiratory: Negative.    Cardiovascular: Negative.   Gastrointestinal: Negative.   Endocrine: Negative.    Genitourinary: Negative.    Musculoskeletal: Negative.   Skin: Negative.   Neurological: Negative.   Hematological: Negative.   Psychiatric/Behavioral:  Positive for depression.     Physical Exam:  height is 4' 11 (1.499 m) and weight is 164 lb (74.4 kg). Her oral temperature is 98.9 F (37.2 C). Her blood pressure is 121/65 and her pulse is 85. Her respiration is 18 and oxygen saturation is 97%.   Wt Readings from Last 3 Encounters:  11/26/24 164 lb (74.4 kg)  11/23/24 161 lb (73 kg)  11/16/24 164 lb 12.8 oz (74.8 kg)    Physical Exam Vitals reviewed.  HENT:     Head: Normocephalic and atraumatic.  Eyes:     Pupils: Pupils are equal, round, and reactive to light.  Cardiovascular:     Rate and Rhythm: Normal rate and regular rhythm.     Heart sounds: Normal heart sounds.  Pulmonary:     Effort: Pulmonary effort is normal.     Breath sounds: Normal breath sounds.  Abdominal:     General: Bowel sounds are normal.     Palpations: Abdomen is soft.  Musculoskeletal:        General: No tenderness or deformity. Normal range of motion.     Cervical back: Normal range of motion.  Lymphadenopathy:     Cervical: No cervical adenopathy.  Skin:    General: Skin is warm and dry.     Findings: No erythema or rash.  Neurological:     Mental Status: She is alert and oriented to person, place, and time.  Psychiatric:        Behavior: Behavior normal.        Thought Content: Thought content normal.        Judgment: Judgment normal.      Lab Results  Component Value Date   WBC 11.8 (H) 11/26/2024   HGB 14.1 11/26/2024   HCT 42.6 11/26/2024   MCV 89.5 11/26/2024   PLT 241 11/26/2024     Chemistry      Component Value Date/Time   NA 140 11/26/2024 1359   NA 140 09/01/2017 1127   K 3.8 11/26/2024 1359   K 4.1 09/01/2017 1127   CL 107 11/26/2024 1359   CO2 20 (L) 11/26/2024 1359   CO2 24 09/01/2017 1127   BUN 28 (H) 11/26/2024 1359   BUN 15.8 09/01/2017 1127    CREATININE 0.84 11/26/2024 1359   CREATININE 0.85 09/12/2020 1349   CREATININE 0.8 09/01/2017 1127      Component Value Date/Time   CALCIUM  9.2 11/26/2024 1359   CALCIUM  8.7 09/01/2017 1127   ALKPHOS 352 (H) 11/26/2024 1359   ALKPHOS 43 09/01/2017 1127   AST 19 11/26/2024 1359   AST 20 09/01/2017 1127   ALT 34 11/26/2024 1359   ALT 32 09/01/2017 1127   BILITOT 0.6 11/26/2024 1359   BILITOT 0.61 09/01/2017 1127       Impression and Plan: Ms. Woolverton is a very nice 84 year old white female.  She has a history of breast cancer back in 2014.  Now, it looks like she has recurrent disease.  Thankfully, her disease is just in her bones.  I really think we will that we should be able to help her out.  Again I think that antiestrogen therapy with Faslodex  and ribociclib  would not be a bad idea for her.  We will try to get started this week if possible.  She will also need Xgeva .  I had a long talk with she and her husband.  He is dealing with pancreatic cancer.  Her daughter, who is a retired development worker, community, was on the cellular phone listening.  They know that we cannot cure this.  Our goal is clearly to decrease the burden of disease.  The goal is to make sure her bones do not break.  Our goal is to make sure that her quality of life is improved.  I really think we should be able to help her out.  Hopefully, we will be able to get started this week.  I will plan to see her back in a month.  Hopefully, by then, she would have had her loading dose of the Faslodex .  I do not think she needs full dose ribociclib .  I just think this might be little bit too much for her.  I think that 400 mg daily dose of ribociclib  would be reasonable.    [name], MD 1/12/20265:05 PM    [1]  Current Outpatient Medications:    ribociclib  succ (KISQALI  400MG  DAILY DOSE) 200 MG Therapy Pack, Take 2 tablets (400 mg total) by mouth daily. Take for 21 days on, 7 days off, repeat every 28 days.,  Disp: 42 tablet, Rfl: 6   acetaminophen  (TYLENOL ) 500 MG tablet, Take 1 tablet (500 mg total) by mouth every 6 (six) hours. (  Patient not taking: Reported on 11/23/2024), Disp: 30 tablet, Rfl: 0   acidophilus (RISAQUAD) CAPS capsule, Take 1 capsule by mouth daily., Disp: , Rfl:    Blood Glucose Monitoring Suppl DEVI, 1 each by Does not apply route in the morning, at noon, and at bedtime. May substitute to any manufacturer covered by patient's insurance. (Patient not taking: Reported on 11/23/2024), Disp: 1 each, Rfl: 0   chlorproMAZINE (THORAZINE) 25 MG tablet, Take 12.5-25 mg by mouth 3 (three) times daily as needed (headache)., Disp: , Rfl:    Cholecalciferol (VITAMIN D ) 50 MCG (2000 UT) tablet, Take 2,000 Units by mouth daily., Disp: , Rfl:    DULoxetine  (CYMBALTA ) 30 MG capsule, Take 1 capsule (30 mg total) by mouth every evening., Disp: 90 capsule, Rfl: 1   esomeprazole  (NEXIUM ) 40 MG capsule, TAKE 1 CAPSULE BY MOUTH DAILY AT NOON (Patient taking differently: Take 40 mg by mouth in the morning.), Disp: 365 capsule, Rfl: 0   hyoscyamine  (LEVSIN  SL) 0.125 MG SL tablet, Place 1 tablet (0.125 mg total) under the tongue 2 (two) times daily. (Patient not taking: Reported on 11/23/2024), Disp: 30 tablet, Rfl: 6   levothyroxine  (SYNTHROID ) 100 MCG tablet, TAKE 1 TABLET BY MOUTH DAILY, Disp: 365 tablet, Rfl: 0   megestrol  (MEGACE ) 400 MG/10ML suspension, Take 10 mLs (400 mg total) by mouth 2 (two) times daily., Disp: 500 mL, Rfl: 3   Multiple Vitamins-Minerals (PRESERVISION AREDS 2 PO), Take 1 capsule by mouth in the morning and at bedtime., Disp: , Rfl:    Na Sulfate-K Sulfate-Mg Sulfate concentrate (SUPREP BOWEL PREP KIT) 17.5-3.13-1.6 GM/177ML SOLN, Take 1 kit (354 mLs total) by mouth as directed. For colonoscopy prep (Patient not taking: Reported on 11/16/2024), Disp: 354 mL, Rfl: 0   omega-3 acid ethyl esters (LOVAZA ) 1 g capsule, TAKE 1 CAPSULE BY MOUTH 2 TIMES A DAY, Disp: 180 capsule, Rfl: 3   oxyCODONE   (ROXICODONE ) 5 MG immediate release tablet, Take 1 tablet (5 mg total) by mouth every 6 (six) hours as needed for severe pain (pain score 7-10) or breakthrough pain. Crush and take with yogurt if painful to swallow pills (Patient not taking: Reported on 11/23/2024), Disp: 25 tablet, Rfl: 0   Rimegepant Sulfate (NURTEC) 75 MG TBDP, DISSOLVE 1 TABLET BY MOUTH DAILY AS NEEDED FOR ACUTE MIGRAINE ATTACKS, Disp: 8 tablet, Rfl: 3   zonisamide (ZONEGRAN) 50 MG capsule, Take 150 mg by mouth daily with supper., Disp: , Rfl:  [2] No Known Allergies  "

## 2024-11-26 NOTE — Telephone Encounter (Signed)
 Oral Oncology Patient Advocate Encounter   Received notification that prior authorization for Kisqali  is required.   PA submitted on 11/26/2024 Key [id number] Status is pending      Charlott Hamilton,  CPhT-Adv  she/her/hers [facility]  [facility] Specialty Pharmacy Services Pharmacy Technician Patient Advocate Specialist III [facility] Phone: [phone]  Fax: [phone] [email or web address]

## 2024-11-27 ENCOUNTER — Encounter: Payer: Self-pay | Admitting: Hematology & Oncology

## 2024-11-27 ENCOUNTER — Ambulatory Visit: Payer: Self-pay | Admitting: Hematology & Oncology

## 2024-11-27 ENCOUNTER — Encounter: Payer: Self-pay | Admitting: *Deleted

## 2024-11-27 ENCOUNTER — Other Ambulatory Visit: Payer: Self-pay

## 2024-11-27 ENCOUNTER — Other Ambulatory Visit (HOSPITAL_COMMUNITY): Payer: Self-pay

## 2024-11-27 ENCOUNTER — Encounter: Payer: Self-pay | Admitting: Pharmacist

## 2024-11-27 ENCOUNTER — Telehealth: Payer: Self-pay | Admitting: Pharmacist

## 2024-11-27 ENCOUNTER — Telehealth: Payer: Self-pay | Admitting: Licensed Clinical Social Worker

## 2024-11-27 DIAGNOSIS — C50511 Malignant neoplasm of lower-outer quadrant of right female breast: Secondary | ICD-10-CM

## 2024-11-27 DIAGNOSIS — R530 Neoplastic (malignant) related fatigue: Secondary | ICD-10-CM

## 2024-11-27 LAB — CANCER ANTIGEN 27.29: CA 27.29: 135.9 U/mL — ABNORMAL HIGH (ref 0.0–38.6)

## 2024-11-27 NOTE — Telephone Encounter (Addendum)
 Oral Oncology Pharmacist Encounter  Received new prescription for Kisqali  (ribociclib ) for the treatment of metastatic breast cancer, HR positive, HER-2 negative in conjunction with fulvestrant , planned duration until disease progression or unacceptable drug toxicity.  CBC w/ Diff and CMP from 11/27/23 assessed, no relevant lab abnormalities requiring baseline dose adjustment required at this time. Dosing per MD discretion. Prescription dose and frequency assessed for appropriateness.  Patient will have baseline EKG obtained in office prior to starting Kisqali .   Current medication list in Epic reviewed, DDIs with Kisqali  identified: Patient will need to stop chlorpromazine while on Kisqali  due to risk of Qtc prolongation (category X interaction). Will instruct patient to stop this while on Kisqali . Category D drug-drug interaction between Kisqali  and Nurtec - Kisqali  can increase serum concentrations of Nurtec, thus risk of side effects. OK for patient to be on both agents concomitantly, however patient should NOT redose a second dose of Nurtec within 48 hours while on Kisqali .   Evaluated chart and no patient barriers to medication adherence noted.   Prescription has been e-scribed to the [facility] for benefits analysis and approval.  Oral Oncology Clinic will continue to follow for insurance authorization, copayment issues, initial counseling and start date.  Asberry Macintosh, PharmD, BCPS, BCOP Hematology/Oncology Clinical Pharmacist [phone] 11/27/2024 8:44 AM

## 2024-11-27 NOTE — Telephone Encounter (Signed)
 Oral Oncology Patient Advocate Encounter  Prior Authorization for Kisqali  has been approved.    PA# [id number] Effective dates: 11/27/2024 through 11/14/2025  Patients co-pay is $45.50.     Charlott Hamilton,  CPhT-Adv  she/her/hers [facility]  [facility] Specialty Pharmacy Services Pharmacy Technician Patient Advocate Specialist III [facility] Phone: [phone]  Fax: [phone] [email or web address]

## 2024-11-27 NOTE — Progress Notes (Signed)
 Patient treatment to include Kisqali  and Faslodex . Faslodex  has been authorized. Scheduling message sent to have patient come in this week for initial dose.   Specialty pharmacy also working on dispensing her Kisqali  which has also been authorized through [employer]. She will need an EKG when here this week for the Faslodex .   Called and scheduled patient for injection this week.   Oncology Nurse Navigator Documentation     11/27/2024    8:00 AM  Oncology Nurse Navigator Flowsheets  Navigator Follow Up Date: 11/29/2024  Navigator Follow Up Reason: Chemotherapy  Navigator Location [facility]  Navigator Encounter Type Appt/Treatment Plan Review;Telephone  Telephone Outgoing Call  Patient Visit Type MedOnc  Treatment Phase Pre-Tx/Tx Discussion  Barriers/Navigation Needs Coordination of Care;Education  Education Other  Interventions Coordination of Care;Education  Acuity Level 2-Minimal Needs (1-2 Barriers Identified)  Coordination of Care Appts  Education Method Verbal  Support Groups/Services Friends and Family  Time Spent with Patient 30

## 2024-11-27 NOTE — Progress Notes (Signed)
 Oral Chemotherapy Pharmacist Encounter  Patient was counseled under telephone encounter from 11/27/24.  Asberry Macintosh, PharmD, BCPS, BCOP Hematology/Oncology Clinical Pharmacist [phone] 11/27/2024 11:07 AM

## 2024-11-27 NOTE — Telephone Encounter (Signed)
 CSW spoke w/ pt's husband who reports he and pt are doing much better. Pt's husband requesting a call later this week to reschedule counseling as he was not home at time of call. CSW to f/u on Friday 1/16.

## 2024-11-27 NOTE — Patient Instructions (Signed)
 [facility] OF [facility]   Thank you for choosing [facility] to provide your oncology/hematology care and for allowing us  to participate in your care today!  As a reminder, we spoke about the following today: Kisqali  (ribociclib ) for the treatment of metastatic breast cancer, HR positive, HER-2 negative in conjunction with fulvestrant , planned duration until disease progression or unacceptable drug toxicity.   Treatment goal: Palliative  Medication handout has been provided.   **For oral cancer medication prescription refill requests, contact your pharmacy and they will contact our office if needed. Allow 5-7 days for refills to be completed by your specialty pharmacy.    Cancer Center General Instructions:  If you have an appointment at the [facility], please go directly to the Cancer Center and check in at the registration area.  We strive to give you quality time with your provider. You may need to reschedule your appointment if you arrive late (15 or more minutes).  Arriving late affects you and other patients whose appointments are after yours.  Also, if you miss three or more appointments without notifying the office, you may be dismissed from the clinic at the provider's discretion.      BELOW ARE SYMPTOMS THAT SHOULD BE REPORTED IMMEDIATELY: *FEVER GREATER THAN 100.4 F (38 C) OR HIGHER *CHILLS OR SWEATING *NAUSEA AND VOMITING THAT IS NOT CONTROLLED WITH YOUR NAUSEA MEDICATION *UNUSUAL SHORTNESS OF BREATH *UNUSUAL BRUISING OR BLEEDING *URINARY PROBLEMS (pain or burning when urinating, or frequent urination) *BOWEL PROBLEMS (unusual diarrhea, constipation, pain near the anus) TENDERNESS IN MOUTH AND THROAT WITH OR WITHOUT PRESENCE OF ULCERS (sore throat, sores in mouth, or a toothache) UNUSUAL RASH, SWELLING OR PAIN  UNUSUAL VAGINAL DISCHARGE OR ITCHING   Items with * indicate a potential emergency and should be  followed up as soon as possible or go to the Emergency Department if any problems should occur.  Please show the CHEMOTHERAPY ALERT CARD at check-in to the Emergency Department and triage nurse.  Should you have questions after your visit or need to cancel or reschedule your appointment, please contact [facility] - A DEPT OF [facility]  [phone] and follow the prompts.  Office hours are 8:00 a.m. to 4:30 p.m. Monday - Friday. Please note that voicemails left after 4:00 p.m. may not be returned until the following business day.  We are closed weekends and major holidays. You have access to a nurse at all times for urgent questions. Please call the main number to the clinic [phone] and follow the prompts.  For any non-urgent questions, you may also contact your provider using MyChart. We now offer e-Visits for anyone 81 and older to request care online for non-urgent symptoms. For details visit [email or web address].   Also download the MyChart app! Go to the app store, search MyChart, open the app, select [place], and log in with your MyChart username and password.

## 2024-11-27 NOTE — Progress Notes (Signed)
 Specialty Pharmacy Initial Fill Coordination Note  Jean Nash is a 84 y.o. female contacted today regarding refills of specialty medication(s) Ribociclib  Succinate (KISQALI  400mg  Daily Dose) .  Patient requested Delivery  on 11/29/24  to verified address [address] [place] [address]   Medication will be filled on 11/28/2024.   Patient is aware of $45.50 copayment.  *Please Deliver to back door

## 2024-11-27 NOTE — Progress Notes (Signed)
 Oral Chemotherapy Pharmacist Encounter  I spoke with patient for overview of: Kisqali  (ribociclib ) for the treatment of metastatic breast cancer, HR positive, HER-2 negative in conjunction with fulvestrant , planned duration until disease progression or unacceptable drug toxicity.   Treatment goal: Palliative  Counseled patient on administration, dosing, side effects, monitoring, drug-food interactions, safe handling, storage, and disposal.  Patient will take Kisqali  200mg  tablets, 2 tablets (400mg ) by mouth once daily without regard to food.  Patient will take Kisqali  for 3 weeks on, 1 week off, repeat every 4 weeks.  Patient knows to avoid grapefruit and pomegranate products while on Kisqali .  Kisqali  start date: 11/30/24  Adverse effects include but are not limited to: nausea, vomiting, diarrhea, fatigue, rash, edema, decreased blood counts, and altered cardiac conduction. Nausea/Vomiting: Patient will alert office if they need an antiemetic for nausea/vomiting.  Rash: Patient knows to keep skin moisturized while on Kisqali  and alert MD if she develops a rash. EKG monitoring: Patient will have baseline EKG obtained on 11/29/24. Per the package insert it is also recommended for patient to repeat EKG ~C1D14 (12/13/24) and then repeat EKG on ~C2D1 (12/28/24) - would recommend adding EKGs onto the Faslodex  injections falling around those dates to decrease patient visits to the office.  Diarrhea: Patient has Imodium (loperamide) available for use at home. She should alert the office of 4 or more loose stools above baseline  Reviewed with patient importance of keeping a medication schedule and plan for any missed doses. No barriers to medication adherence identified.  Medication reconciliation performed and medication/allergy list updated. Patient denies use of either chlorpromazine or Nurtec for headaches/migraines. Medication list has been updated to remove these agents.   Distress thermometer  flowsheet: Distress thermometer not completed during telephone call as patient has been on previous lines of therapy.   Communication and Learning Assessment Primary learner: Patient and patient's husband Barriers to learning: No barriers Preferred language: English Learning preferences: Listening Reading  All questions answered.  Jean Nash voiced understanding and appreciation.   Medication education handout placed in mail for patient. Patient knows to call the office with questions or concerns. Oral Chemotherapy Clinic phone number provided to patient.   Jean Nash, PharmD, BCPS, BCOP Hematology/Oncology Clinical Pharmacist [phone] 11/27/2024 10:57 AM

## 2024-11-28 ENCOUNTER — Other Ambulatory Visit: Payer: Self-pay

## 2024-11-28 ENCOUNTER — Encounter: Payer: Self-pay | Admitting: Hematology & Oncology

## 2024-11-28 ENCOUNTER — Other Ambulatory Visit (HOSPITAL_COMMUNITY): Payer: Self-pay

## 2024-11-28 ENCOUNTER — Telehealth: Payer: Self-pay

## 2024-11-28 NOTE — Telephone Encounter (Signed)
 Oral Oncology Patient Advocate Encounter  Was successful in securing patient a $7500 grant from [facility] to provide copayment coverage for Kisqali .  This will keep the out of pocket expense at $0.     [employer] ID: [id number]   The billing information is as follows and has been shared with [facility].    RxBin: [id number] PCN: [id number] Member ID: [id number] Group ID: [id number] Dates of Eligibility: 10/28/2024 through 10/27/2025  Fund:  Breast Cancer   Charlott Hamilton,  CPhT-Adv  she/her/hers [facility]  [facility] Specialty Pharmacy Services Pharmacy Technician Patient Advocate Specialist III [facility] Phone: [phone]  Fax: [phone] [email or web address]

## 2024-11-29 ENCOUNTER — Other Ambulatory Visit (HOSPITAL_COMMUNITY): Payer: Self-pay

## 2024-11-29 ENCOUNTER — Inpatient Hospital Stay: Attending: Hematology & Oncology

## 2024-11-29 ENCOUNTER — Encounter: Payer: Self-pay | Admitting: Hematology & Oncology

## 2024-11-29 ENCOUNTER — Inpatient Hospital Stay: Admitting: Hematology & Oncology

## 2024-11-29 ENCOUNTER — Encounter: Payer: Self-pay | Admitting: *Deleted

## 2024-11-29 ENCOUNTER — Inpatient Hospital Stay

## 2024-11-29 ENCOUNTER — Other Ambulatory Visit: Payer: Self-pay

## 2024-11-29 VITALS — BP 137/81 | HR 87 | Temp 98.0°F | Resp 16

## 2024-11-29 DIAGNOSIS — Z170 Estrogen receptor positive status [ER+]: Secondary | ICD-10-CM

## 2024-11-29 DIAGNOSIS — Z5111 Encounter for antineoplastic chemotherapy: Secondary | ICD-10-CM | POA: Diagnosis not present

## 2024-11-29 MED ORDER — DENOSUMAB 120 MG/1.7ML ~~LOC~~ SOLN
120.0000 mg | Freq: Once | SUBCUTANEOUS | Status: AC
  Administered 2024-11-29: 120 mg via SUBCUTANEOUS
  Filled 2024-11-29: qty 1.7

## 2024-11-29 MED ORDER — FULVESTRANT 250 MG/5ML IM SOSY
500.0000 mg | PREFILLED_SYRINGE | Freq: Once | INTRAMUSCULAR | Status: AC
  Administered 2024-11-29: 500 mg via INTRAMUSCULAR
  Filled 2024-11-29: qty 10

## 2024-11-29 NOTE — Patient Instructions (Signed)
 Denosumab  Injection (Oncology) What is this medication? DENOSUMAB  (den oh SUE mab) prevents weakened bones caused by cancer. It may also be used to treat noncancerous bone tumors that cannot be removed by surgery. It can also be used to treat high calcium  levels in the blood caused by cancer. It works by blocking a protein that causes bones to break down quickly. This slows down the release of calcium  from bones, which lowers calcium  levels in your blood. It also makes your bones stronger and less likely to break (fracture). This medicine may be used for other purposes; ask your health care provider or pharmacist if you have questions. COMMON BRAND NAME(S): XGEVA  What should I tell my care team before I take this medication? They need to know if you have any of these conditions: Dental disease Having surgery or tooth extraction Infection Kidney disease Low levels of calcium  or vitamin D  in the blood Malnutrition On hemodialysis Skin conditions or sensitivity Thyroid  or parathyroid disease An unusual reaction to denosumab , other medications, foods, dyes, or preservatives Pregnant or trying to get pregnant Breast-feeding How should I use this medication? This medication is for injection under the skin. It is given by your care team in a hospital or clinic setting. A special MedGuide will be given to you before each treatment. Be sure to read this information carefully each time. Talk to your care team about the use of this medication in children. While it may be prescribed for children as young as 13 years for selected conditions, precautions do apply. Overdosage: If you think you have taken too much of this medicine contact a poison control center or emergency room at once. NOTE: This medicine is only for you. Do not share this medicine with others. What if I miss a dose? Keep appointments for follow-up doses. It is important not to miss your dose. Call your care team if you are unable to  keep an appointment. What may interact with this medication? Do not take this medication with any of the following: Other medications containing denosumab  This medication may also interact with the following: Medications that lower your chance of fighting infection Steroid medications, such as prednisone  or cortisone This list may not describe all possible interactions. Give your health care provider a list of all the medicines, herbs, non-prescription drugs, or dietary supplements you use. Also tell them if you smoke, drink alcohol, or use illegal drugs. Some items may interact with your medicine. What should I watch for while using this medication? Your condition will be monitored carefully while you are receiving this medication. You may need blood work while taking this medication. This medication may increase your risk of getting an infection. Call your care team for advice if you get a fever, chills, sore throat, or other symptoms of a cold or flu. Do not treat yourself. Try to avoid being around people who are sick. You should make sure you get enough calcium  and vitamin D  while you are taking this medication, unless your care team tells you not to. Discuss the foods you eat and the vitamins you take with your care team. Some people who take this medication have severe bone, joint, or muscle pain. This medication may also increase your risk for jaw problems or a broken thigh bone. Tell your care team right away if you have severe pain in your jaw, bones, joints, or muscles. Tell your care team if you have any pain that does not go away or that gets worse. Talk  to your care team if you may be pregnant. Serious birth defects can occur if you take this medication during pregnancy and for 5 months after the last dose. You will need a negative pregnancy test before starting this medication. Contraception is recommended while taking this medication and for 5 months after the last dose. Your care team  can help you find the option that works for you. What side effects may I notice from receiving this medication? Side effects that you should report to your care team as soon as possible: Allergic reactions--skin rash, itching, hives, swelling of the face, lips, tongue, or throat Bone, joint, or muscle pain Low calcium  level--muscle pain or cramps, confusion, tingling, or numbness in the hands or feet Osteonecrosis of the jaw--pain, swelling, or redness in the mouth, numbness of the jaw, poor healing after dental work, unusual discharge from the mouth, visible bones in the mouth Side effects that usually do not require medical attention (report to your care team if they continue or are bothersome): Cough Diarrhea Fatigue Headache Nausea This list may not describe all possible side effects. Call your doctor for medical advice about side effects. You may report side effects to FDA at 1-800-FDA-1088. Where should I keep my medication? This medication is given in a hospital or clinic. It will not be stored at home. NOTE: This sheet is a summary. It may not cover all possible information. If you have questions about this medicine, talk to your doctor, pharmacist, or health care provider.  2024 Elsevier/Gold Standard (2022-03-24 00:00:00)

## 2024-11-29 NOTE — Progress Notes (Signed)
 Patient comes in to start her treatment. Visited with her and her husband. The Kisqali  was delivered this morning. Confirmed with Dr Timmy that it should start today. Gave husband another one of my cards and told him to call me if he needed any additional education regarding the medication once he got home and opened package.   Baseline EKG done. Patient received first Faslodex  dose.   Oncology Nurse Navigator Documentation     11/29/2024   10:30 AM  Oncology Nurse Navigator Flowsheets  Planned Course of Treatment Hormonal Therapy;Chemotherapy  Phase of Treatment Hormonal Therapy  Hormonal Actual Start Date: 11/29/2024  Navigator Follow Up Date: 12/14/2024  Navigator Follow Up Reason: Follow-up Appointment;Chemotherapy  Navigator Location [facility]  Navigator Encounter Type Treatment  Treatment Initiated Date 11/29/2024  Patient Visit Type MedOnc  Treatment Phase First Chemo Tx  Barriers/Navigation Needs Coordination of Care;Education  Education Other  Interventions Education;Psycho-Social Support  Acuity Level 2-Minimal Needs (1-2 Barriers Identified)  Education Method Verbal  Support Groups/Services Friends and Family  Time Spent with Patient 15

## 2024-12-03 ENCOUNTER — Telehealth: Payer: Self-pay | Admitting: Hematology & Oncology

## 2024-12-04 ENCOUNTER — Inpatient Hospital Stay

## 2024-12-05 ENCOUNTER — Other Ambulatory Visit: Payer: Self-pay

## 2024-12-05 ENCOUNTER — Ambulatory Visit: Admitting: Adult Health

## 2024-12-05 ENCOUNTER — Inpatient Hospital Stay: Admitting: Dietician

## 2024-12-05 VITALS — BP 138/60 | HR 98 | Temp 97.0°F | Wt 164.0 lb

## 2024-12-05 DIAGNOSIS — R4189 Other symptoms and signs involving cognitive functions and awareness: Secondary | ICD-10-CM | POA: Diagnosis not present
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
## 2024-12-05 NOTE — Patient Instructions (Signed)
 Please contact Jean Nash - neuropsychiatry [phone]

## 2024-12-10 ENCOUNTER — Encounter: Payer: Self-pay | Admitting: *Deleted
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
## 2024-12-12 ENCOUNTER — Encounter: Payer: Self-pay | Admitting: *Deleted

## 2024-12-14 ENCOUNTER — Encounter: Payer: Self-pay | Admitting: *Deleted

## 2024-12-14 ENCOUNTER — Inpatient Hospital Stay

## 2024-12-14 ENCOUNTER — Encounter: Payer: Self-pay | Admitting: Hematology & Oncology

## 2024-12-14 ENCOUNTER — Inpatient Hospital Stay: Admitting: Hematology & Oncology

## 2024-12-14 ENCOUNTER — Other Ambulatory Visit: Payer: Self-pay

## 2024-12-14 VITALS — BP 129/59 | HR 80 | Temp 98.3°F | Resp 20 | Ht 59.0 in | Wt 167.4 lb

## 2024-12-14 DIAGNOSIS — Z170 Estrogen receptor positive status [ER+]: Secondary | ICD-10-CM

## 2024-12-14 DIAGNOSIS — Z5111 Encounter for antineoplastic chemotherapy: Secondary | ICD-10-CM | POA: Diagnosis not present

## 2024-12-14 DIAGNOSIS — E039 Hypothyroidism, unspecified: Secondary | ICD-10-CM

## 2024-12-14 DIAGNOSIS — C50511 Malignant neoplasm of lower-outer quadrant of right female breast: Secondary | ICD-10-CM | POA: Diagnosis not present

## 2024-12-14 LAB — CMP (CANCER CENTER ONLY)
ALT: 27 U/L (ref 0–44)
AST: 14 U/L — ABNORMAL LOW (ref 15–41)
Albumin: 4.2 g/dL (ref 3.5–5.0)
Alkaline Phosphatase: 176 U/L — ABNORMAL HIGH (ref 38–126)
Anion gap: 11 (ref 5–15)
BUN: 23 mg/dL (ref 8–23)
CO2: 20 mmol/L — ABNORMAL LOW (ref 22–32)
Calcium: 8.6 mg/dL — ABNORMAL LOW (ref 8.9–10.3)
Chloride: 109 mmol/L (ref 98–111)
Creatinine: 0.77 mg/dL (ref 0.44–1.00)
GFR, Estimated: >60 mL/min
Glucose, Bld: 150 mg/dL — ABNORMAL HIGH (ref 70–99)
Potassium: 4.6 mmol/L (ref 3.5–5.1)
Sodium: 140 mmol/L (ref 135–145)
Total Bilirubin: 0.6 mg/dL (ref 0.0–1.2)
Total Protein: 6.3 g/dL — ABNORMAL LOW (ref 6.5–8.1)

## 2024-12-14 LAB — CBC WITH DIFFERENTIAL (CANCER CENTER ONLY)
Abs Immature Granulocytes: 0.07 10*3/uL (ref 0.00–0.07)
Basophils Absolute: 0 10*3/uL (ref 0.0–0.1)
Basophils Relative: 0 %
Eosinophils Absolute: 0.1 10*3/uL (ref 0.0–0.5)
Eosinophils Relative: 2 %
HCT: 42.1 % (ref 36.0–46.0)
Hemoglobin: 14 g/dL (ref 12.0–15.0)
Immature Granulocytes: 1 %
Lymphocytes Relative: 33 %
Lymphs Abs: 1.9 10*3/uL (ref 0.7–4.0)
MCH: 30.1 pg (ref 26.0–34.0)
MCHC: 33.3 g/dL (ref 30.0–36.0)
MCV: 90.5 fL (ref 80.0–100.0)
Monocytes Absolute: 0.3 10*3/uL (ref 0.1–1.0)
Monocytes Relative: 6 %
Neutro Abs: 3.4 10*3/uL (ref 1.7–7.7)
Neutrophils Relative %: 58 %
Platelet Count: 262 10*3/uL (ref 150–400)
RBC: 4.65 MIL/uL (ref 3.87–5.11)
RDW: 20.6 % — ABNORMAL HIGH (ref 11.5–15.5)
WBC Count: 5.8 10*3/uL (ref 4.0–10.5)
nRBC: 0 % (ref 0.0–0.2)

## 2024-12-14 LAB — TSH: TSH: 1.93 u[IU]/mL (ref 0.350–4.500)

## 2024-12-14 LAB — PREALBUMIN: Prealbumin: 29 mg/dL (ref 18–38)

## 2024-12-14 LAB — MAGNESIUM: Magnesium: 1.9 mg/dL (ref 1.7–2.4)

## 2024-12-14 LAB — SAMPLE TO BLOOD BANK

## 2024-12-14 LAB — LACTATE DEHYDROGENASE: LDH: 235 U/L (ref 105–235)

## 2024-12-14 MED ORDER — FULVESTRANT 250 MG/5ML IM SOSY
500.0000 mg | PREFILLED_SYRINGE | Freq: Once | INTRAMUSCULAR | Status: AC
  Administered 2024-12-14: 500 mg via INTRAMUSCULAR
  Filled 2024-12-14: qty 10

## 2024-12-14 MED ORDER — METHYLPHENIDATE HCL 5 MG PO TABS: 5.0000 mg | ORAL_TABLET | Freq: Two times a day (BID) | ORAL | 0 refills | Status: AC

## 2024-12-14 NOTE — Progress Notes (Unsigned)
 Patient comes in with her husband. Per him, she is doing poorly. Low appetite, weak, not sleeping, and short of breath. He is worried about her but is unsure what the cause of her problems are.   Dr Timmy feels like her problems may be more related to her mental health as her cancer is responding well to her treatment and her side effects do not seem to be related to her treatment. He will reach out to her psychiatrist, in addition to sending in a script for Ritalin .   Oncology Nurse Navigator Documentation     12/14/2024    1:45 PM  Oncology Nurse Navigator Flowsheets  Navigator Follow Up Date: 12/27/2024  Navigator Follow Up Reason: Follow-up Appointment;Chemotherapy  Navigator Location [facility]  Navigator Encounter Type Follow-up Appt  Patient Visit Type MedOnc  Treatment Phase Active Tx  Barriers/Navigation Needs Coordination of Care;Education  Education Other  Interventions Education;Psycho-Social Support  Acuity Level 2-Minimal Needs (1-2 Barriers Identified)  Education Method Verbal  Support Groups/Services Friends and Family  Time Spent with Patient 30

## 2024-12-14 NOTE — Progress Notes (Unsigned)
 " Hematology and Oncology Follow Up Visit  Jean Nash [id number] 1941-11-09 84 y.o. 12/14/2024   Principle Diagnosis:  Metastatic breast cancer-ER+/PR+/HER2- - PIK3CA (+)  Current Therapy:   Faslodex  500 mg IM monthly Ribociclib  400 mg p.o. daily (21 on/7 off) Xgeva  120 mg IM every 3 months     Interim History:  Jean Nash is back for her second visit.  She has metastatic breast cancer.  She saw us  initially on 10/24/2024.  She has a past history of early-stage breast cancer back in 2014.  She had a had radiation therapy.  She is on tamoxifen  for 5 years.  Now, she has metastatic disease.  Her CA 27.29 is elevated at 256.  We did do a PET scan on her.  This was done on 11/09/2024.  This showed widespread osseous metastatic disease.  There is no evidence of metastatic disease in the lung, liver, lymph nodes.  She just feels tired.  She does not have a lot of energy.  She is not complaining of any pain.  I feel that we can certainly help her out.  Again, she has estrogen positive disease.  I feel that we should be able to help us  out with antiestrogen therapy.  I think that Faslodex  and ribociclib  would be a very good protocol for her.  Again I think she is in pretty decent shape.  She is not hurting.  She seems to be eating well.  Her albumin  is quite good.  She has had no cough.  No shortness of breath.  She has had no change in bowel or bladder habits.  She has had no bleeding.  There is been no leg swelling.  Currently, I would say that her performance status is probably ECOG 2.  Medications: Current Medications[1]  Allergies: Allergies[2]  Past Medical History, Surgical history, Social history, and Family History were reviewed and updated.  Review of Systems: Review of Systems  Constitutional:  Positive for fatigue.  HENT:  Negative.    Eyes: Negative.   Respiratory: Negative.    Cardiovascular: Negative.   Gastrointestinal: Negative.   Endocrine: Negative.    Genitourinary: Negative.    Musculoskeletal: Negative.   Skin: Negative.   Neurological: Negative.   Hematological: Negative.   Psychiatric/Behavioral:  Positive for depression.     Physical Exam:  vitals were not taken for this visit.   Wt Readings from Last 3 Encounters:  12/05/24 164 lb (74.4 kg)  11/26/24 164 lb (74.4 kg)  11/23/24 161 lb (73 kg)    Physical Exam Vitals reviewed.  HENT:     Head: Normocephalic and atraumatic.  Eyes:     Pupils: Pupils are equal, round, and reactive to light.  Cardiovascular:     Rate and Rhythm: Normal rate and regular rhythm.     Heart sounds: Normal heart sounds.  Pulmonary:     Effort: Pulmonary effort is normal.     Breath sounds: Normal breath sounds.  Abdominal:     General: Bowel sounds are normal.     Palpations: Abdomen is soft.  Musculoskeletal:        General: No tenderness or deformity. Normal range of motion.     Cervical back: Normal range of motion.  Lymphadenopathy:     Cervical: No cervical adenopathy.  Skin:    General: Skin is warm and dry.     Findings: No erythema or rash.  Neurological:     Mental Status: She is alert and oriented to person, place,  and time.  Psychiatric:        Behavior: Behavior normal.        Thought Content: Thought content normal.        Judgment: Judgment normal.      Lab Results  Component Value Date   WBC 5.8 12/14/2024   HGB 14.0 12/14/2024   HCT 42.1 12/14/2024   MCV 90.5 12/14/2024   PLT 262 12/14/2024     Chemistry      Component Value Date/Time   NA 140 11/26/2024 1359   NA 140 09/01/2017 1127   K 3.8 11/26/2024 1359   K 4.1 09/01/2017 1127   CL 107 11/26/2024 1359   CO2 20 (L) 11/26/2024 1359   CO2 24 09/01/2017 1127   BUN 28 (H) 11/26/2024 1359   BUN 15.8 09/01/2017 1127   CREATININE 0.84 11/26/2024 1359   CREATININE 0.85 09/12/2020 1349   CREATININE 0.8 09/01/2017 1127      Component Value Date/Time   CALCIUM  9.2 11/26/2024 1359   CALCIUM  8.7  09/01/2017 1127   ALKPHOS 352 (H) 11/26/2024 1359   ALKPHOS 43 09/01/2017 1127   AST 19 11/26/2024 1359   AST 20 09/01/2017 1127   ALT 34 11/26/2024 1359   ALT 32 09/01/2017 1127   BILITOT 0.6 11/26/2024 1359   BILITOT 0.61 09/01/2017 1127       Impression and Plan: Jean Nash is a very nice 84 year old white female.  She has a history of breast cancer back in 2014.  Now, it looks like she has recurrent disease.  Thankfully, her disease is just in her bones.  I really think we will that we should be able to help her out.  Again I think that antiestrogen therapy with Faslodex  and ribociclib  would not be a bad idea for her.  We will try to get started this week if possible.  She will also need Xgeva .  I had a long talk with she and her husband.  He is dealing with pancreatic cancer.  Her daughter, who is a retired development worker, community, was on the cellular phone listening.  They know that we cannot cure this.  Our goal is clearly to decrease the burden of disease.  The goal is to make sure her bones do not break.  Our goal is to make sure that her quality of life is improved.  I really think we should be able to help her out.  Hopefully, we will be able to get started this week.  I will plan to see her back in a month.  Hopefully, by then, she would have had her loading dose of the Faslodex .  I do not think she needs full dose ribociclib .  I just think this might be little bit too much for her.  I think that 400 mg daily dose of ribociclib  would be reasonable.    [name], MD 1/30/20262:07 PM       [1]  Current Outpatient Medications:    acidophilus (RISAQUAD) CAPS capsule, Take 1 capsule by mouth daily., Disp: , Rfl:    Cholecalciferol (VITAMIN D ) 50 MCG (2000 UT) tablet, Take 2,000 Units by mouth daily., Disp: , Rfl:    DULoxetine  (CYMBALTA ) 30 MG capsule, Take 1 capsule (30 mg total) by mouth every evening., Disp: 90 capsule, Rfl: 1   levothyroxine  (SYNTHROID ) 100  MCG tablet, TAKE 1 TABLET BY MOUTH DAILY, Disp: 365 tablet, Rfl: 0   megestrol  (MEGACE ) 400 MG/10ML suspension, Take 10 mLs (400 mg total) by  mouth 2 (two) times daily., Disp: 500 mL, Rfl: 3   Multiple Vitamins-Minerals (PRESERVISION AREDS 2 PO), Take 1 capsule by mouth in the morning and at bedtime., Disp: , Rfl:    omega-3 acid ethyl esters (LOVAZA ) 1 g capsule, TAKE 1 CAPSULE BY MOUTH 2 TIMES A DAY, Disp: 180 capsule, Rfl: 3   ribociclib  succ (KISQALI  400MG  DAILY DOSE) 200 MG Therapy Pack, Take 2 tablets (400 mg total) by mouth daily. Take for 21 days on, 7 days off, repeat every 28 days., Disp: 42 tablet, Rfl: 6   zonisamide (ZONEGRAN) 50 MG capsule, Take 150 mg by mouth daily with supper., Disp: , Rfl:  [2] No Known Allergies "

## 2024-12-14 NOTE — Patient Instructions (Signed)
 Fulvestrant  Injection What is this medication? FULVESTRANT  (ful VES trant) treats breast cancer. It works by blocking the hormone estrogen in breast tissue, which prevents breast cancer cells from spreading or growing. This medicine may be used for other purposes; ask your health care provider or pharmacist if you have questions. COMMON BRAND NAME(S): FASLODEX  What should I tell my care team before I take this medication? They need to know if you have any of these conditions: Bleeding disorder Liver disease Low blood cell levels (white cells, red cells, and platelets) An unusual or allergic reaction to fulvestrant , other medications, foods, dyes, or preservatives Pregnant or trying to get pregnant Breastfeeding How should I use this medication? This medication is injected into a muscle. It is given by your care team in a hospital or clinic setting. This medication is not approved for use in children. Overdosage: If you think you have taken too much of this medicine contact a poison control center or emergency room at once. NOTE: This medicine is only for you. Do not share this medicine with others. What if I miss a dose? Keep appointments for follow-up doses. It is important not to miss your dose. Call your care team if you are unable to keep an appointment. What may interact with this medication? Fluoroestradiol F18 This list may not describe all possible interactions. Give your health care provider a list of all the medicines, herbs, non-prescription drugs, or dietary supplements you use. Also tell them if you smoke, drink alcohol, or use illegal drugs. Some items may interact with your medicine. What should I watch for while using this medication? Your condition will be monitored carefully while you are receiving this medication. You may need blood work done while you are taking this medication. This medication is injected into a muscle. Talk to your care team if you also take  medications that prevent or treat blood clots, such as warfarin. Blood thinners may increase the risk of bleeding or bruising in the muscle where this medication is injected. The benefits of this medication may outweigh the risks. Your care team can help you find the option that works for you. They can also help limit the risk of bleeding. Talk to your care team if you may be pregnant. Serious birth defects can occur if you take this medication during pregnancy and for 1 year after the last dose. You will need a negative pregnancy test before starting this medication. Contraception is recommended while taking this medication and for 1 year after the last dose. Your care team can help you find the option that works for you. Do not breastfeed while taking this medication and for 1 year after the last dose. This medication may cause infertility. Talk to your care team if you are concerned about your fertility. What side effects may I notice from receiving this medication? Side effects that you should report to your care team as soon as possible: Allergic reactions or angioedema--skin rash, itching or hives, swelling of the face, eyes, lips, tongue, arms, or legs, trouble swallowing or breathing Pain, tingling, or numbness in the hands or feet Side effects that usually do not require medical attention (report to your care team if they continue or are bothersome): Bone, joint, or muscle pain Constipation Headache Hot flashes Nausea Pain, redness, or irritation at injection site Unusual weakness or fatigue This list may not describe all possible side effects. Call your doctor for medical advice about side effects. You may report side effects to FDA at  1-800-FDA-1088. Where should I keep my medication? This medication is given in a hospital or clinic. It will not be stored at home. NOTE: This sheet is a summary. It may not cover all possible information. If you have questions about this medicine, talk to  your doctor, pharmacist, or health care provider.  2025 [employer]/Gold Standard (2024-09-06 00:00:00)

## 2024-12-15 ENCOUNTER — Ambulatory Visit: Payer: Self-pay | Admitting: Hematology & Oncology

## 2024-12-15 LAB — CANCER ANTIGEN 27.29: CA 27.29: 78.6 U/mL — ABNORMAL HIGH (ref 0.0–38.6)

## 2024-12-17 ENCOUNTER — Encounter: Payer: Self-pay | Admitting: Hematology & Oncology

## 2024-12-17 ENCOUNTER — Other Ambulatory Visit: Payer: Self-pay

## 2024-12-18 ENCOUNTER — Encounter: Payer: Self-pay | Admitting: Hematology & Oncology

## 2024-12-18 ENCOUNTER — Other Ambulatory Visit: Payer: Self-pay

## 2024-12-18 ENCOUNTER — Telehealth: Payer: Self-pay | Admitting: Nurse Practitioner

## 2024-12-18 ENCOUNTER — Other Ambulatory Visit (HOSPITAL_COMMUNITY): Payer: Self-pay

## 2024-12-18 NOTE — Progress Notes (Signed)
 Specialty Pharmacy Refill Coordination Note  Jean Nash is a 84 y.o. female contacted today regarding refills of specialty medication(s) Ribociclib  Succinate (KISQALI  400mg  Daily Dose)   Patient requested Delivery   Delivery date: 12/21/24   Verified address: [address] [place] [address]   Medication will be filled on: 12/20/24

## 2024-12-19 ENCOUNTER — Other Ambulatory Visit: Payer: Self-pay | Admitting: Adult Health

## 2024-12-19 DIAGNOSIS — E119 Type 2 diabetes mellitus without complications: Secondary | ICD-10-CM

## 2024-12-19 NOTE — Telephone Encounter (Signed)
" °  The original prescription was discontinued on 11/23/2024 by Merna Huxley, NP. Renewing this prescription may not be appropriate.   Does this still needs to be d/c "

## 2024-12-20 ENCOUNTER — Other Ambulatory Visit: Payer: Self-pay

## 2024-12-20 NOTE — Addendum Note (Signed)
 Addended by: CLAUDENE NEVINS A on: 12/20/2024 02:05 PM   Modules accepted: Orders

## 2024-12-24 ENCOUNTER — Telehealth (HOSPITAL_COMMUNITY): Admitting: Psychiatry

## 2024-12-26 ENCOUNTER — Inpatient Hospital Stay: Attending: Hematology & Oncology

## 2024-12-27 ENCOUNTER — Inpatient Hospital Stay: Admitting: Hematology & Oncology

## 2024-12-27 ENCOUNTER — Inpatient Hospital Stay

## 2025-01-07 ENCOUNTER — Encounter

## 2025-01-25 ENCOUNTER — Inpatient Hospital Stay

## 2025-01-25 ENCOUNTER — Inpatient Hospital Stay: Attending: Hematology & Oncology

## 2025-01-25 ENCOUNTER — Inpatient Hospital Stay: Admitting: Hematology & Oncology

## 2025-01-28 ENCOUNTER — Ambulatory Visit (HOSPITAL_BASED_OUTPATIENT_CLINIC_OR_DEPARTMENT_OTHER): Admitting: Pulmonary Disease

## 2025-02-19 ENCOUNTER — Ambulatory Visit (HOSPITAL_COMMUNITY): Admitting: Psychiatry

## 2025-02-21 ENCOUNTER — Ambulatory Visit (HOSPITAL_COMMUNITY): Admitting: Psychiatry

## 2025-02-22 ENCOUNTER — Inpatient Hospital Stay: Admitting: Hematology & Oncology

## 2025-02-22 ENCOUNTER — Inpatient Hospital Stay

## 2025-02-22 ENCOUNTER — Inpatient Hospital Stay: Attending: Hematology & Oncology
